# Patient Record
Sex: Female | Born: 1970 | Race: Black or African American | Hispanic: No | Marital: Married | Smoking: Never smoker
Health system: Southern US, Community
[De-identification: ages and names within clinical notes are randomized; demographics above are authoritative.]

## PROBLEM LIST (undated history)

## (undated) DIAGNOSIS — C801 Malignant (primary) neoplasm, unspecified: Secondary | ICD-10-CM

## (undated) DIAGNOSIS — T82198A Other mechanical complication of other cardiac electronic device, initial encounter: Secondary | ICD-10-CM

## (undated) DIAGNOSIS — E039 Hypothyroidism, unspecified: Secondary | ICD-10-CM

## (undated) DIAGNOSIS — D649 Anemia, unspecified: Secondary | ICD-10-CM

## (undated) DIAGNOSIS — E785 Hyperlipidemia, unspecified: Secondary | ICD-10-CM

## (undated) DIAGNOSIS — I38 Endocarditis, valve unspecified: Secondary | ICD-10-CM

## (undated) DIAGNOSIS — E538 Deficiency of other specified B group vitamins: Secondary | ICD-10-CM

## (undated) DIAGNOSIS — R0602 Shortness of breath: Secondary | ICD-10-CM

## (undated) DIAGNOSIS — G4730 Sleep apnea, unspecified: Secondary | ICD-10-CM

## (undated) DIAGNOSIS — Z95810 Presence of automatic (implantable) cardiac defibrillator: Secondary | ICD-10-CM

## (undated) DIAGNOSIS — I10 Essential (primary) hypertension: Secondary | ICD-10-CM

## (undated) DIAGNOSIS — I959 Hypotension, unspecified: Secondary | ICD-10-CM

## (undated) DIAGNOSIS — R0600 Dyspnea, unspecified: Secondary | ICD-10-CM

## (undated) DIAGNOSIS — I428 Other cardiomyopathies: Secondary | ICD-10-CM

## (undated) DIAGNOSIS — R000 Tachycardia, unspecified: Secondary | ICD-10-CM

## (undated) DIAGNOSIS — N189 Chronic kidney disease, unspecified: Secondary | ICD-10-CM

## (undated) DIAGNOSIS — Z8711 Personal history of peptic ulcer disease: Secondary | ICD-10-CM

## (undated) DIAGNOSIS — E559 Vitamin D deficiency, unspecified: Secondary | ICD-10-CM

## (undated) DIAGNOSIS — I639 Cerebral infarction, unspecified: Secondary | ICD-10-CM

## (undated) DIAGNOSIS — Z8639 Personal history of other endocrine, nutritional and metabolic disease: Secondary | ICD-10-CM

## (undated) DIAGNOSIS — I252 Old myocardial infarction: Secondary | ICD-10-CM

## (undated) DIAGNOSIS — I509 Heart failure, unspecified: Secondary | ICD-10-CM

## (undated) DIAGNOSIS — Z8719 Personal history of other diseases of the digestive system: Secondary | ICD-10-CM

## (undated) DIAGNOSIS — R0683 Snoring: Secondary | ICD-10-CM

## (undated) DIAGNOSIS — R002 Palpitations: Secondary | ICD-10-CM

## (undated) DIAGNOSIS — I219 Acute myocardial infarction, unspecified: Secondary | ICD-10-CM

## (undated) DIAGNOSIS — E0590 Thyrotoxicosis, unspecified without thyrotoxic crisis or storm: Secondary | ICD-10-CM

## (undated) HISTORY — DX: Old myocardial infarction: I25.2

## (undated) HISTORY — DX: Cerebral infarction, unspecified: I63.9

## (undated) HISTORY — PX: CARDIAC CATHETERIZATION: SHX172

## (undated) HISTORY — DX: Dyspnea, unspecified: R06.00

## (undated) HISTORY — DX: Malignant (primary) neoplasm, unspecified: C80.1

## (undated) HISTORY — DX: Snoring: R06.83

## (undated) HISTORY — DX: Chronic kidney disease, unspecified: N18.9

## (undated) HISTORY — DX: Other cardiomyopathies: I42.8

## (undated) HISTORY — DX: Essential (primary) hypertension: I10

## (undated) HISTORY — DX: Shortness of breath: R06.02

## (undated) HISTORY — DX: Hypotension, unspecified: I95.9

## (undated) HISTORY — DX: Morbid (severe) obesity due to excess calories: E66.01

## (undated) HISTORY — DX: Tachycardia, unspecified: R00.0

## (undated) HISTORY — DX: Heart failure, unspecified: I50.9

## (undated) HISTORY — DX: Endocarditis, valve unspecified: I38

## (undated) HISTORY — DX: Personal history of other endocrine, nutritional and metabolic disease: Z86.39

## (undated) HISTORY — DX: Deficiency of other specified B group vitamins: E53.8

## (undated) HISTORY — DX: Thyrotoxicosis, unspecified without thyrotoxic crisis or storm: E05.90

## (undated) HISTORY — PX: CARDIAC DEFIBRILLATOR PLACEMENT: SHX171

## (undated) HISTORY — DX: Palpitations: R00.2

## (undated) HISTORY — DX: Vitamin D deficiency, unspecified: E55.9

## (undated) HISTORY — DX: Anemia, unspecified: D64.9

## (undated) HISTORY — DX: Hyperlipidemia, unspecified: E78.5

## (undated) HISTORY — PX: LEFT VENTRICULAR ASSIST DEVICE: SHX2679

## (undated) NOTE — *Deleted (*Deleted)
Physical Medicine and Rehabilitation Admission H&P    Chief Complaint  Patient presents with  . Code Stroke  : HPI: [name]. Fedrick is a 24 year old right-handed female history of diastolic congestive heart failure with ejection fraction of 15 to 20% maintained on Entresto, nonischemic cardiomyopathy/VT/PVCs maintained on amiodarone and with cardiac defibrillator placement 2006, hypertension, hypothyroidism, morbid obesity with BMI 44.01.  Per chart review lives with . Husband works during the day.  Two-level home 3 steps to entry.  Independent prior to admission.  She did require some rest breaks during her ADLs.  She does not drive.  Presented 04/17/2020 with right side weakness and difficulty speaking.  Admission chemistries glucose 106 creatinine 1.28, urine drug screen negative, hemoglobin A1c 5.5.  Cranial CT scan showed subtle hypodensity involving the left putamen.  No acute hemorrhage.  Patient did not receive TPA.  CT angiogram of head and neck occlusion proximal left M1 segment with good distal collateral flow to the left MCA branches.  No other intracranial stenosis.  Patient underwent endovascular revascularization procedure per interventional radiology.  She did remain intubated for a short time.  Latest follow-up cranial CT scan redemonstrated abnormal hypodensity within the posterior aspect of the left putamen.  Follow-up echo cardiogram with ejection fraction of 20 to 25% grade 3 diastolic dysfunction and close follow-up per cardiology services with diuresis as directed as well as adjustments in amiodarone for frequent PVCs.  Currently maintained on low-dose aspirin as well as Brilinta.  Subcutaneous Lovenox for DVT prophylaxis.  Tolerating a regular diet.  Therapy evaluations completed and patient was admitted for a comprehensive rehab program.  Review of Systems  Constitutional: Negative for chills and fever.  HENT: Negative for hearing loss.   Eyes: Negative for blurred vision and  double vision.  Respiratory: Positive for shortness of breath. Negative for cough.   Cardiovascular: Positive for palpitations and leg swelling. Negative for chest pain.  Gastrointestinal: Positive for constipation. Negative for heartburn, nausea and vomiting.  Genitourinary: Negative for dysuria, flank pain and hematuria.  Musculoskeletal: Positive for joint pain and myalgias.  Skin: Negative for rash.  Psychiatric/Behavioral: The patient has insomnia.   All other systems reviewed and are negative.  Past Medical History:  Diagnosis Date  . Automatic implantable cardioverter-defibrillator in situ   . Chronic CHF (congestive heart failure) (HCC)    a. EF 15-20% b. RHC (09/2013) RA 14, RV 57/22, PA 64/36 (48), PCWP 18, FIck CO/CI 3.7/1.6, PVR 8.1 WU, PA sat 47%   . History of stomach ulcers   . Hypertension   . Hypotension   . Hypothyroidism   . Morbid obesity (HCC)   . Myocardial infarction (HCC) 08/2013  . Nocturnal dyspnea   . Nonischemic cardiomyopathy (HCC)   . Sinus tachycardia   . Snoring-prob OSA 09/04/2011  . Sprint Fidel[employer] ICD lead RECALL  [id number]   . UARS (upper airway resistance syndrome) 09/04/2011   HST 12/2013:  AHI 4/hr (numerous episodes of airflow reduction that did not have concomitant desaturation)    Past Surgical History:  Procedure Laterality Date  . BREATH TEK H PYLORI N/A 11/09/2014   Procedure: BREATH TEK H PYLORI;  Surgeon: Gaynelle Adu, MD;  Location: [facility];  Service: General;  Laterality: N/A;  . CARDIAC CATHETERIZATION  ~ 2006; 09/2013  . CARDIAC CATHETERIZATION N/A 05/23/2016   Procedure: Right Heart Cath;  Surgeon: Laurey Morale, MD;  Location: [facility] INVASIVE CV LAB;  Service: Cardiovascular;  Laterality: N/A;  . CARDIAC DEFIBRILLATOR  PLACEMENT  2006; 12/26/2013   Med[employer]onic [id number]; 12-2013 ICD gen change and RV lead revision with new 6935 RV lead by Dr Graciela Husbands  . CESAREAN SECTION  1999  . IMPLANTABLE CARDIOVERTER DEFIBRILLATOR GENERATOR CHANGE N/A  12/26/2013   Procedure: IMPLANTABLE CARDIOVERTER DEFIBRILLATOR GENERATOR CHANGE;  Surgeon: Duke Salvia, MD;  Location: [facility] CATH LAB;  Service: Cardiovascular;  Laterality: N/A;  . IR CT HEAD LTD  04/17/2020  . IR CT HEAD LTD  04/17/2020  . IR INTRA CRAN STENT  04/17/2020  . IR PERCUTANEOUS ART THROMBECTOMY/INFUSION INTRACRANIAL INC DIAG ANGIO  04/17/2020  . LEAD REVISION N/A 12/26/2013   Procedure: LEAD REVISION;  Surgeon: Duke Salvia, MD;  Location: [facility] CATH LAB;  Service: Cardiovascular;  Laterality: N/A;  . RADIOLOGY WITH ANESTHESIA N/A 04/17/2020   Procedure: Code Stroke;  Surgeon: Julieanne Cotton, MD;  Location: [facility] OR;  Service: Radiology;  Laterality: N/A;  . RIGHT HEART CATHETERIZATION N/A 02/15/2014   Procedure: RIGHT HEART CATH;  Surgeon: Laurey Morale, MD;  Location: [facility] CATH LAB;  Service: Cardiovascular;  Laterality: N/A;  . TUBAL LIGATION  1999   Family History  Problem Relation Age of Onset  . Heart disease Maternal Grandmother   . Heart disease Mother   . Obesity Mother   . High blood pressure Father   . Stroke Father    Social History:  reports that she has never smoked. She has never used smokeless tobacco. She reports that she does not drink alcohol and does not use drugs. Allergies: No Known Allergies Medications Prior to Admission  Medication Sig Dispense Refill  . amiodarone (PACERONE) 200 MG tablet Take 1 tablet (200 mg total) by mouth daily. 90 tablet 3  . atorvastatin (LIPITOR) 40 MG tablet TAKE 1 TABLET BY MOUTH ONCE A DAY (Patient taking differently: Take 40 mg by mouth daily. ) 30 tablet 11  . carvedilol (COREG) 25 MG tablet TAKE 1 TABLET BY MOUTH TWICE A DAY WITH A MEAL (Patient taking differently: Take 25 mg by mouth 2 (two) times daily with a meal. ) 180 tablet 3  . CORLANOR 5 MG TABS tablet TAKE 1 TABLET BY MOUTH TWICE (2) DAILY WITH MEALS (Patient taking differently: Take 5 mg by mouth 2 (two) times daily with a meal. ) 60 tablet 6  . dapagliflozin  propanediol (FARXIGA) 10 MG TABS tablet Take 10 mg by mouth daily before breakfast. 30 tablet 5  . digoxin (LANOXIN) 0.125 MG tablet TAKE 1 TABLET BY MOUTH ONCE A DAY (Patient taking differently: Take 0.125 mg by mouth daily. ) 30 tablet 11  . ENTRESTO 49-51 MG TAKE 1 TABLET BY MOUTH TWICE (2) DAILY (Patient taking differently: Take 1 tablet by mouth 2 (two) times daily. ) 60 tablet 11  . spironolactone (ALDACTONE) 25 MG tablet TAKE 1 TABLET BY MOUTH EVERY NIGHT AT BEDTIME (Patient taking differently: Take 25 mg by mouth at bedtime. ) 90 tablet 3  . torsemide (DEMADEX) 20 MG tablet Take 4 tablets (80 mg total) by mouth 2 (two) times daily for 3 days, THEN 4 tablets (80 mg total) in the morning for 30 days, THEN 3 tablets (60 mg total) every evening. (Patient taking differently: Take 4 tablets (80mg ) in the morning and 2 tablets (40mg ) in the evening.) 540 tablet 3  . levothyroxine (SYNTHROID) 25 MCG tablet Take 1 tablet (25 mcg total) by mouth daily before breakfast. 90 tablet 3  . metolazone (ZAROXOLYN) 2.5 MG tablet Take 1 tablet (2.5 mg total) by  mouth as needed (extra fluid as directed by HF clinic). 5 tablet 3  . potassium chloride SA (KLOR-CON) 20 MEQ tablet Take 2 tablets (40 mEq total) by mouth 2 (two) times daily. (Patient taking differently: Take 40 mEq by mouth 2 (two) times daily. Taking 2 tab in the morning and 2 tab at night) 60 tablet 6  . Vitamin D, Ergocalciferol, (DRISDOL) 1.25 MG (50000 UNIT) CAPS capsule Take 1 capsule (50,000 Units total) by mouth every 7 (seven) days. 4 capsule 0    Drug Regimen Review Drug regimen was reviewed and remains appropriate with no significant issues identified  Home: Home Living Family/patient expects to be discharged to:: Private residence Living Arrangements: Spouse/significant other, Children (son) Available Help at Discharge: Family (husband works from home) Type of Home: House Home Access: Stairs to enter Secretary/administrator of Steps: 3  Entrance Stairs-Rails: None Home Layout: Two level Alternate Level Stairs-Number of Steps: flight Alternate Level Stairs-Rails: Right, Left Bathroom Shower/Tub: Engineer, manufacturing systems: Standard Home Equipment: None  Lives With: Family   Functional History: Prior Function Level of Independence: Independent Comments: required rest breaks for ADL tasks, spouse assisted with socks, performed simple iADL tasks, doesn't drive  Functional Status:  Mobility: Bed Mobility Overal bed mobility: Modified Independent Bed Mobility: Rolling, Sidelying to Sit Rolling: Mod assist Sidelying to sit: Mod assist General bed mobility comments: VCs for sequencing/carrying out tasks, assist for RLE over EOB and trunk elevation; given increased time/cues pt is able to scoot herself towards EOB  Transfers Overall transfer level: Needs assistance Equipment used: 1 person hand held assist, 2 person hand held assist Transfers: Sit to/from Stand Sit to Stand: Supervision General transfer comment: performed x2 from EOB; light stabilizing assist once upright, cues for safe hand placement  Ambulation/Gait Ambulation/Gait assistance: Supervision Gait Distance (Feet): 200 Feet Assistive device: None Gait Pattern/deviations: Step-through pattern General Gait Details: steady with mild drift/list R with higher level balance challenges.  Pt able to scan, change/incr speeds, back up without incident.  Still not quite a baseline/age appropriate levels Gait velocity interpretation: >2.62 ft/sec, indicative of community ambulatory    ADL: ADL Overall ADL's : Needs assistance/impaired Eating/Feeding: NPO Grooming: Minimal assistance, Sitting Upper Body Bathing: Minimal assistance, Sitting Lower Body Bathing: Moderate assistance, Sit to/from stand, +2 for safety/equipment Upper Body Dressing : Minimal assistance, Sitting Lower Body Dressing: Moderate assistance, +2 for safety/equipment, Sit to/from stand  Lower Body Dressing Details (indicate cue type and reason): assist to don socks  Toilet Transfer: Minimal assistance, +2 for safety/equipment, +2 for physical assistance Toilet Transfer Details (indicate cue type and reason): simulated via few steps to recliner; +2 minA progressed to minA+1 (+2 safety) Toileting- Clothing Manipulation and Hygiene: Moderate assistance, Sit to/from stand Functional mobility during ADLs: Minimal assistance, +2 for physical assistance, +2 for safety/equipment (HHA)  Cognition: Cognition Overall Cognitive Status: Within Functional Limits for tasks assessed Arousal/Alertness: Awake/alert Orientation Level: Oriented X4 Attention: Focused, Sustained Focused Attention: Appears intact Sustained Attention: Appears intact Memory: Appears intact Awareness: Appears intact Problem Solving: Appears intact Safety/Judgment: Appears intact Cognition Arousal/Alertness: Awake/alert Behavior During Therapy: WFL for tasks assessed/performed Overall Cognitive Status: Within Functional Limits for tasks assessed Area of Impairment: Problem solving, Following commands, Awareness, Memory Memory: Decreased short-term memory Following Commands: Follows one step commands with increased time, Follows one step commands inconsistently Awareness: Emergent Problem Solving: Slow processing, Decreased initiation, Requires verbal cues, Requires tactile cues General Comments: pt A&Ox4, word finding difficulties, increased processing time and increased time for  verbalizations (approx 5-10 second delay); often requires multimodal cueing for command follow   Physical Exam: Blood pressure 103/62, pulse 78, temperature (!) 97.4 F (36.3 C), temperature source Axillary, resp. rate 17, height 5' 3.5" (1.613 m), weight 114.5 kg, SpO2 100 %. Physical Exam Neurological:     Comments: Patient is alert.  Mood is flat.  She is dysarthric with some aphasia word finding difficulty and hesitancy.  She  does provide her name and age.  Follows simple commands.     Results for orders placed or performed during the hospital encounter of 04/17/20 (from the past 48 hour(s))  Hemoglobin A1c     Status: None   Collection Time: 04/18/20  9:50 AM  Result Value Ref Range   Hgb A1c MFr Bld 5.5 4.8 - 5.6 %    Comment: (NOTE)         Prediabetes: 5.7 - 6.4         Diabetes: >6.4         Glycemic control for adults with diabetes: <7.0    Mean Plasma Glucose 111 mg/dL    Comment: (NOTE) Performed At: [facility] [address] [place], [place] [id number] Jolene Schimke MD JY:[id number]   Lipid panel     Status: Abnormal   Collection Time: 04/18/20  9:50 AM  Result Value Ref Range   Cholesterol 198 0 - 200 mg/dL   Triglycerides 92 <865 mg/dL   HDL 50 >78 mg/dL   Total CHOL/HDL Ratio 4.0 RATIO   VLDL 18 0 - 40 mg/dL   LDL Cholesterol 469 (H) 0 - 99 mg/dL    Comment:        Total Cholesterol/HDL:CHD Risk Coronary Heart Disease Risk Table                     Men   Women  1/2 Average Risk   3.4   3.3  Average Risk       5.0   4.4  2 X Average Risk   9.6   7.1  3 X Average Risk  23.4   11.0        Use the calculated Patient Ratio above and the CHD Risk Table to determine the patient's CHD Risk.        ATP III CLASSIFICATION (LDL):  <100     mg/dL   Optimal  629-528  mg/dL   Near or Above                    Optimal  130-159  mg/dL   Borderline  413-244  mg/dL   High  >010     mg/dL   Very High Performed at [facility], [address], [place], [place] [address]   Magnesium     Status: Abnormal   Collection Time: 04/18/20  9:50 AM  Result Value Ref Range   Magnesium 1.6 (L) 1.7 - 2.4 mg/dL    Comment: Performed at [facility], [address], [place], [place] [address]  Renal function panel     Status: Abnormal   Collection Time: 04/18/20  9:50 AM  Result Value Ref Range   Sodium 142 135 - 145 mmol/L   Potassium 3.5 3.5 - 5.1 mmol/L   Chloride 115 (H) 98  - 111 mmol/L   CO2 19 (L) 22 - 32 mmol/L   Glucose, Bld 91 70 - 99 mg/dL    Comment: Glucose reference range applies only to  samples taken after fasting for at least 8 hours.   BUN 9 6 - 20 mg/dL   Creatinine, Ser 1.61 0.44 - 1.00 mg/dL   Calcium 8.2 (L) 8.9 - 10.3 mg/dL   Phosphorus 2.8 2.5 - 4.6 mg/dL   Albumin 2.8 (L) 3.5 - 5.0 g/dL   GFR, Estimated >09 >60 mL/min    Comment: (NOTE) Calculated using the CKD-EPI Creatinine Equation (2021)    Anion gap 8 5 - 15    Comment: Performed at [facility], [address], [place], [place] [address]  CBC with Differential/Platelet     Status: Abnormal   Collection Time: 04/18/20  9:50 AM  Result Value Ref Range   WBC 16.0 (H) 4.0 - 10.5 K/uL   RBC 4.05 3.87 - 5.11 MIL/uL   Hemoglobin 9.6 (L) 12.0 - 15.0 g/dL   HCT 81.1 (L) 36 - 46 %   MCV 80.2 80.0 - 100.0 fL   MCH 23.7 (L) 26.0 - 34.0 pg   MCHC 29.5 (L) 30.0 - 36.0 g/dL   RDW 91.4 (H) 78.2 - 95.6 %   Platelets 294 150 - 400 K/uL   nRBC 0.0 0.0 - 0.2 %   Neutrophils Relative % 81 %   Neutro Abs 13.0 (H) 1.7 - 7.7 K/uL   Lymphocytes Relative 10 %   Lymphs Abs 1.6 0.7 - 4.0 K/uL   Monocytes Relative 8 %   Monocytes Absolute 1.3 (H) 0.1 - 1.0 K/uL   Eosinophils Relative 0 %   Eosinophils Absolute 0.0 0.0 - 0.5 K/uL   Basophils Relative 0 %   Basophils Absolute 0.0 0.0 - 0.1 K/uL   Immature Granulocytes 1 %   Abs Immature Granulocytes 0.09 (H) 0.00 - 0.07 K/uL    Comment: Performed at [facility], [address] [place], [place] [address]  Platelet inhibition p2y12 (Not at [facility])     Status: Abnormal   Collection Time: 04/18/20 12:37 PM  Result Value Ref Range   Platelet Function  P2Y12 48 (L) 182 - 335 PRU    Comment: (NOTE) The literature has shown a direct correlation of PRU values over 230 with higher risks of thrombotic events. Lower PRU values are associated with platelet inhibition. Performed at [facility], [address], [place], [place] [address]    Urine rapid drug screen (hosp performed)     Status: None   Collection Time: 04/18/20  4:11 PM  Result Value Ref Range   Opiates NONE DETECTED NONE DETECTED   Cocaine NONE DETECTED NONE DETECTED   Benzodiazepines NONE DETECTED NONE DETECTED   Amphetamines NONE DETECTED NONE DETECTED   Tetrahydrocannabinol NONE DETECTED NONE DETECTED   Barbiturates NONE DETECTED NONE DETECTED    Comment: (NOTE) DRUG SCREEN FOR MEDICAL PURPOSES ONLY.  IF CONFIRMATION IS NEEDED FOR ANY PURPOSE, NOTIFY LAB WITHIN 5 DAYS.  LOWEST DETECTABLE LIMITS FOR URINE DRUG SCREEN Drug Class                     Cutoff (ng/mL) Amphetamine and metabolites    1000 Barbiturate and metabolites    200 Benzodiazepine                 200 Tricyclics and metabolites     300 Opiates and metabolites        300 Cocaine and metabolites        300 THC  50 Performed at [facility], [address] [place], [place] [address]   Basic metabolic panel     Status: Abnormal   Collection Time: 04/20/20  3:03 AM  Result Value Ref Range   Sodium 143 135 - 145 mmol/L   Potassium 2.8 (L) 3.5 - 5.1 mmol/L   Chloride 105 98 - 111 mmol/L   CO2 26 22 - 32 mmol/L   Glucose, Bld 93 70 - 99 mg/dL    Comment: Glucose reference range applies only to samples taken after fasting for at least 8 hours.   BUN 9 6 - 20 mg/dL   Creatinine, Ser 6.04 (H) 0.44 - 1.00 mg/dL   Calcium 9.0 8.9 - 54.0 mg/dL   GFR, Estimated 55 (L) >60 mL/min    Comment: (NOTE) Calculated using the CKD-EPI Creatinine Equation (2021)    Anion gap 12 5 - 15    Comment: Performed at [facility], [address], [place], [place] [address]  Magnesium     Status: None   Collection Time: 04/20/20  3:03 AM  Result Value Ref Range   Magnesium 1.9 1.7 - 2.4 mg/dL    Comment: Performed at [facility], [address] [place], [place] [address]   CT ANGIO HEAD W OR WO CONTRAST  Result Date: 04/19/2020 CLINICAL DATA:   Mechanical thrombectomy with stent placement 2 days ago. Follow-up EXAM: CT ANGIOGRAPHY HEAD AND NECK TECHNIQUE: Multidetector CT imaging of the head and neck was performed using the standard protocol during bolus administration of intravenous contrast. Multiplanar CT image reconstructions and MIPs were obtained to evaluate the vascular anatomy. Carotid stenosis measurements (when applicable) are obtained utilizing NASCET criteria, using the distal internal carotid diameter as the denominator. CONTRAST:  OMNIPAQUE IOHEXOL 350 MG/ML SOLN COMPARISON:  Head CT from yesterday.  CTA head neck from 2 days ago FINDINGS: CT HEAD FINDINGS Brain: Better seen the likely non progressed left stratum infarct. No visible cortical infarct or hemorrhagic complication. No hydrocephalus Vascular: See below Skull: Negative Sinuses: Retention cyst appearance in the left maxillary sinus. Orbits: No acute finding Review of the MIP images confirms the above findings CTA NECK FINDINGS Aortic arch: No acute finding.  Two vessel branching. Right carotid system: Vessels are smooth and widely patent Left carotid system: Vessels are smooth and widely patent. Vertebral arteries: No proximal subclavian stenosis. Symmetrically sized vertebral arteries that are smooth and widely patent to the dura. Skeleton: Negative Other neck: No acute finding.  Unilateral right thyroid gland. Upper chest: Enlarged heart, partially covered. Review of the MIP images confirms the above findings CTA HEAD FINDINGS Anterior circulation: Partially obscured siphons due to venous enhancement. No stenosis or irregularity is seen. Left MCA stenting, spanning M1 to M2 segments. At the mid stent there is nonocclusive luminal soft tissue density from the posterior wall. No downstream diminished flow. No contralateral embolism is seen. There is early branching of the right MCA. Posterior circulation: The vertebral and basilar arteries are diffusely patent. No branch  occlusion, beading, or aneurysm. Venous sinuses: Diffusely patent Anatomic variants: None significant Review of the MIP images confirms the above findings IMPRESSION: 1. Left MCA stenting with nonocclusive thrombus seen along the posterior wall of the mid stent. 2. No stenosis or embolic source seen in the neck. Electronically Signed   By: Marnee Spring M.D.   On: 04/19/2020 07:38   CT HEAD WO CONTRAST  Result Date: 04/18/2020 CLINICAL DATA:  Stroke, follow-up. Intracranial stent  placed yesterday. EXAM: CT HEAD WITHOUT CONTRAST TECHNIQUE: Contiguous axial images were obtained from the base of the skull through the vertex without intravenous contrast. COMPARISON:  Noncontrast head CT performed 04/17/2020 at 6:18 p.m. Noncontrast head CT, CT angiogram head/neck and CT perfusion performed earlier the same day 04/17/2020. FINDINGS: Brain: Previously demonstrated vague hyperdensity within the left basal ganglia, left insula and left frontoparietal cortex is no longer present. Abnormal hypodensity is again demonstrated within the posterior aspect of the left putamen suspicious for acute/early subacute infarction (series 3, image 16). No other definite loss of gray-white differentiation is identified. No evidence of acute intracranial hemorrhage. No extra-axial fluid collection. No evidence of intracranial mass. No midline shift. Vascular: Redemonstrated vascular stent in the region of the M1/M2 left middle cerebral artery. No hyperdense vessel. Skull: Normal. Negative for fracture or focal lesion. Sinuses/Orbits: Visualized orbits show no acute finding. Moderate-sized left maxillary sinus mucous retention cyst. No significant mastoid effusion. IMPRESSION: Previously demonstrated vague hyperdensity within the left basal ganglia, left insula and left frontoparietal cortex is no longer present and likely reflected contrast staining. Redemonstrated abnormal hypodensity within the posterior aspect of the left putamen  suspicious for acute/early subacute infarction. No other definite loss of gray-white differentiation is identified. No acute intracranial hemorrhage. Redemonstrated vascular stent in the region of the M1/M2 left MCA. Electronically Signed   By: Jackey Loge DO   On: 04/18/2020 10:02   CT ANGIO NECK W OR WO CONTRAST  Result Date: 04/19/2020 CLINICAL DATA:  Mechanical thrombectomy with stent placement 2 days ago. Follow-up EXAM: CT ANGIOGRAPHY HEAD AND NECK TECHNIQUE: Multidetector CT imaging of the head and neck was performed using the standard protocol during bolus administration of intravenous contrast. Multiplanar CT image reconstructions and MIPs were obtained to evaluate the vascular anatomy. Carotid stenosis measurements (when applicable) are obtained utilizing NASCET criteria, using the distal internal carotid diameter as the denominator. CONTRAST:  OMNIPAQUE IOHEXOL 350 MG/ML SOLN COMPARISON:  Head CT from yesterday.  CTA head neck from 2 days ago FINDINGS: CT HEAD FINDINGS Brain: Better seen the likely non progressed left stratum infarct. No visible cortical infarct or hemorrhagic complication. No hydrocephalus Vascular: See below Skull: Negative Sinuses: Retention cyst appearance in the left maxillary sinus. Orbits: No acute finding Review of the MIP images confirms the above findings CTA NECK FINDINGS Aortic arch: No acute finding.  Two vessel branching. Right carotid system: Vessels are smooth and widely patent Left carotid system: Vessels are smooth and widely patent. Vertebral arteries: No proximal subclavian stenosis. Symmetrically sized vertebral arteries that are smooth and widely patent to the dura. Skeleton: Negative Other neck: No acute finding.  Unilateral right thyroid gland. Upper chest: Enlarged heart, partially covered. Review of the MIP images confirms the above findings CTA HEAD FINDINGS Anterior circulation: Partially obscured siphons due to venous enhancement. No stenosis or  irregularity is seen. Left MCA stenting, spanning M1 to M2 segments. At the mid stent there is nonocclusive luminal soft tissue density from the posterior wall. No downstream diminished flow. No contralateral embolism is seen. There is early branching of the right MCA. Posterior circulation: The vertebral and basilar arteries are diffusely patent. No branch occlusion, beading, or aneurysm. Venous sinuses: Diffusely patent Anatomic variants: None significant Review of the MIP images confirms the above findings IMPRESSION: 1. Left MCA stenting with nonocclusive thrombus seen along the posterior wall of the mid stent. 2. No stenosis or embolic source seen in the neck. Electronically Signed   By: Marja Kays  Watts M.D.   On: 04/19/2020 07:38   DG CHEST PORT 1 VIEW  Result Date: 04/19/2020 CLINICAL DATA:  Stroke. EXAM: PORTABLE CHEST 1 VIEW COMPARISON:  April 18, 2020. FINDINGS: Moderate cardiomegaly is again noted. Left-sided pacemaker is unchanged in position. Endotracheal and nasogastric tubes have been removed. No pneumothorax or pleural effusion is noted. Both lungs are clear. The visualized skeletal structures are unremarkable. IMPRESSION: Stable cardiomegaly. No acute cardiopulmonary abnormality seen. Endotracheal and nasogastric tubes have been removed. Electronically Signed   By: Lupita Raider M.D.   On: 04/19/2020 09:28   DG CHEST PORT 1 VIEW  Result Date: 04/18/2020 CLINICAL DATA:  Respiratory failure, ventilatory support EXAM: PORTABLE CHEST 1 VIEW COMPARISON:  04/17/2020 FINDINGS: Endotracheal tube 4 cm above the carina. Left subclavian pacemaker/defibrillator noted. NG tube enters the stomach with the tip not visualized. Stable cardiomegaly with prominent central vascularity. Dense left lower lobe collapse/consolidation, slightly worse. Difficult to exclude developing left effusion. No significant pneumothorax. IMPRESSION: Increased left lower lobe retrocardiac collapse/consolidation. Stable  cardiomegaly and vascular congestion Electronically Signed   By: Judie Petit.  Shick M.D.   On: 04/18/2020 09:41       Medical Problem List and Plan: 1.  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  Antithrombotics: -DVT/anticoagulation: Lovenox  -antiplatelet therapy: Aspirin 81 mg daily, Brilinta 90 mg twice daily 3. Pain Management: Tylenol as needed 4. Mood: Not emotional support  -antipsychotic agents: N/A 5. Neuropsych: This patient is capable of making decisions on her own behalf. 6. Skin/Wound Care: Routine skin checks 7. Fluids/Electrolytes/Nutrition: Routine in and outs with follow-up chemistries 8.  Chronic systolic congestive heart failure/and ICM/Medtronic ICD.  Follow-up cardiology services.  Continue Aldactone 25 mg daily, Coreg 3.125 mg twice daily as well as torsemide adjusted per cardiology services. 9.  History of VT/PVCs.  Amiodarone 200 mg twice daily.  Lanoxin 0.125 mg daily 10.  Hypothyroidism.  Synthroid 11.  Morbid obesity.  BMI 44.01.  Dietary follow-up 12.  Hyperlipidemia.  Lipitor 13.  OSA.  CPAP ***  Charlton Amor, PA-C 04/20/2020

---

## 1997-06-23 HISTORY — PX: TUBAL LIGATION: SHX77

## 2000-10-05 ENCOUNTER — Encounter (HOSPITAL_COMMUNITY): Admission: RE | Admit: 2000-10-05 | Discharge: 2001-01-03 | Payer: Self-pay | Admitting: Cardiology

## 2005-03-13 ENCOUNTER — Inpatient Hospital Stay: Payer: Self-pay | Admitting: Internal Medicine

## 2005-03-13 ENCOUNTER — Other Ambulatory Visit: Payer: Self-pay

## 2005-03-20 ENCOUNTER — Ambulatory Visit: Payer: Self-pay | Admitting: Internal Medicine

## 2005-03-27 ENCOUNTER — Observation Stay (HOSPITAL_COMMUNITY): Admission: AD | Admit: 2005-03-27 | Discharge: 2005-03-28 | Payer: Self-pay | Admitting: Internal Medicine

## 2005-03-28 ENCOUNTER — Ambulatory Visit: Payer: Self-pay | Admitting: Internal Medicine

## 2005-04-14 ENCOUNTER — Ambulatory Visit: Payer: Self-pay

## 2005-07-01 ENCOUNTER — Ambulatory Visit: Payer: Self-pay | Admitting: Internal Medicine

## 2005-09-30 ENCOUNTER — Ambulatory Visit: Payer: Self-pay | Admitting: Internal Medicine

## 2006-01-12 ENCOUNTER — Ambulatory Visit: Payer: Self-pay

## 2006-04-14 ENCOUNTER — Ambulatory Visit: Payer: Self-pay | Admitting: Internal Medicine

## 2006-04-24 ENCOUNTER — Ambulatory Visit: Payer: Self-pay

## 2006-05-25 ENCOUNTER — Ambulatory Visit: Payer: Self-pay | Admitting: Internal Medicine

## 2006-07-14 ENCOUNTER — Ambulatory Visit: Payer: Self-pay | Admitting: Internal Medicine

## 2006-10-13 ENCOUNTER — Ambulatory Visit: Payer: Self-pay | Admitting: Internal Medicine

## 2006-12-22 ENCOUNTER — Ambulatory Visit: Payer: Self-pay | Admitting: Internal Medicine

## 2007-04-01 ENCOUNTER — Ambulatory Visit: Payer: Self-pay | Admitting: Internal Medicine

## 2007-06-29 ENCOUNTER — Ambulatory Visit: Payer: Self-pay | Admitting: Internal Medicine

## 2007-10-11 ENCOUNTER — Ambulatory Visit: Payer: Self-pay | Admitting: Internal Medicine

## 2008-02-29 ENCOUNTER — Ambulatory Visit: Payer: Self-pay | Admitting: Internal Medicine

## 2008-02-29 LAB — CONVERTED CEMR LAB
ALT: 12 units/L (ref 0–35)
AST: 18 units/L (ref 0–37)
Albumin: 3.7 g/dL (ref 3.5–5.2)
Alkaline Phosphatase: 49 units/L (ref 39–117)
BUN: 11 mg/dL (ref 6–23)
Bilirubin, Direct: 0.1 mg/dL (ref 0.0–0.3)
CO2: 26 meq/L (ref 19–32)
Calcium: 9.3 mg/dL (ref 8.4–10.5)
Chloride: 108 meq/L (ref 96–112)
Creatinine, Ser: 0.8 mg/dL (ref 0.4–1.2)
GFR calc Af Amer: 104 mL/min
GFR calc non Af Amer: 86 mL/min
Glucose, Bld: 90 mg/dL (ref 70–99)
Magnesium: 1.8 mg/dL (ref 1.5–2.5)
Potassium: 4.2 meq/L (ref 3.5–5.1)
Pro B Natriuretic peptide (BNP): 23 pg/mL (ref 0.0–100.0)
Sodium: 139 meq/L (ref 135–145)
TSH: 2.9 microintl units/mL (ref 0.35–5.50)
Total Bilirubin: 0.5 mg/dL (ref 0.3–1.2)
Total Protein: 7.6 g/dL (ref 6.0–8.3)

## 2008-05-11 ENCOUNTER — Ambulatory Visit: Payer: Self-pay

## 2008-05-16 ENCOUNTER — Ambulatory Visit: Payer: Self-pay

## 2008-05-30 ENCOUNTER — Ambulatory Visit: Payer: Self-pay | Admitting: Internal Medicine

## 2008-09-13 ENCOUNTER — Ambulatory Visit: Payer: Self-pay | Admitting: Internal Medicine

## 2008-09-19 ENCOUNTER — Encounter: Payer: Self-pay | Admitting: Internal Medicine

## 2008-12-12 ENCOUNTER — Ambulatory Visit: Payer: Self-pay | Admitting: Internal Medicine

## 2009-01-02 ENCOUNTER — Encounter: Payer: Self-pay | Admitting: Internal Medicine

## 2009-03-12 ENCOUNTER — Ambulatory Visit: Payer: Self-pay | Admitting: Internal Medicine

## 2009-03-12 DIAGNOSIS — E669 Obesity, unspecified: Secondary | ICD-10-CM | POA: Insufficient documentation

## 2009-03-12 DIAGNOSIS — I5022 Chronic systolic (congestive) heart failure: Secondary | ICD-10-CM | POA: Insufficient documentation

## 2009-03-12 DIAGNOSIS — I428 Other cardiomyopathies: Secondary | ICD-10-CM | POA: Insufficient documentation

## 2009-06-12 ENCOUNTER — Ambulatory Visit: Payer: Self-pay | Admitting: Internal Medicine

## 2009-06-25 ENCOUNTER — Encounter: Payer: Self-pay | Admitting: Internal Medicine

## 2009-09-11 ENCOUNTER — Ambulatory Visit: Payer: Self-pay | Admitting: Internal Medicine

## 2009-10-01 ENCOUNTER — Encounter: Payer: Self-pay | Admitting: Internal Medicine

## 2009-12-12 ENCOUNTER — Ambulatory Visit: Payer: Self-pay | Admitting: Internal Medicine

## 2010-01-03 ENCOUNTER — Encounter: Payer: Self-pay | Admitting: Internal Medicine

## 2010-03-07 ENCOUNTER — Encounter (INDEPENDENT_AMBULATORY_CARE_PROVIDER_SITE_OTHER): Payer: Self-pay | Admitting: *Deleted

## 2010-04-25 ENCOUNTER — Ambulatory Visit: Payer: Self-pay | Admitting: Internal Medicine

## 2010-05-13 ENCOUNTER — Telehealth (INDEPENDENT_AMBULATORY_CARE_PROVIDER_SITE_OTHER): Payer: Self-pay | Admitting: *Deleted

## 2010-05-15 ENCOUNTER — Encounter: Payer: Self-pay | Admitting: Internal Medicine

## 2010-07-01 ENCOUNTER — Encounter: Payer: Self-pay | Admitting: Cardiovascular Disease

## 2010-07-01 ENCOUNTER — Ambulatory Visit
Admission: RE | Admit: 2010-07-01 | Discharge: 2010-07-01 | Payer: Self-pay | Source: Home / Self Care | Attending: Internal Medicine | Admitting: Internal Medicine

## 2010-07-01 ENCOUNTER — Encounter: Payer: Self-pay | Admitting: Internal Medicine

## 2010-07-02 LAB — CONVERTED CEMR LAB
BUN: 13 mg/dL (ref 6–23)
CO2: 26 meq/L (ref 19–32)
Calcium: 9.7 mg/dL (ref 8.4–10.5)
Chloride: 102 meq/L (ref 96–112)
Creatinine, Ser: 0.83 mg/dL (ref 0.40–1.20)
Glucose, Bld: 87 mg/dL (ref 70–99)
Potassium: 3.6 meq/L (ref 3.5–5.3)
Sodium: 139 meq/L (ref 135–145)

## 2010-07-23 NOTE — Letter (Signed)
Summary: Remote Device Check  [facility], Main Office  [address]   [place], [place] [address]   Phone: [phone]  Fax: [phone]     October 01, 2009 MRN: [id number]   BRIAHNA PESCADOR [address] [place], [place]  [address]   Dear Ms. Kott,   Your remote transmission was recieved and reviewed by your physician.  All diagnostics were within normal limits for you.  __X___Your next transmission is scheduled for:  December 12, 2009.  Please transmit at any time this day.  If you have a wireless device your transmission will be sent automatically.     Sincerely,  Proofreader

## 2010-07-23 NOTE — Letter (Signed)
Summary: Remote Device Check  [facility], Main Office  [address]   [place], [place] [address]   Phone: [phone]  Fax: [phone]     January 03, 2010 MRN: [id number]   [facility] [address] [place], [place]  [address]   Dear Ms. Yoshida,   Your remote transmission was recieved and reviewed by your physician.  All diagnostics were within normal limits for you.   __X____Your next office visit is scheduled for:  September w/Dr Graciela Husbands. Please call our office to schedule an appointment.    Sincerely,  Vella Kohler

## 2010-07-23 NOTE — Letter (Signed)
Summary: Remote Device Check  [facility], Main Office  [address]   [place], [place] [address]   Phone: [phone]  Fax: [phone]     May 15, 2010 MRN: [id number]   [facility] [address] [place], [place]  [address]   Dear Ms. Degeorge,   Your remote transmission was recieved and reviewed by your physician.  All diagnostics were within normal limits for you.  __X____Your next office visit is scheduled for: 07-01-10 @ 1545 with Dr Graciela Husbands.     Sincerely,  Vella Kohler

## 2010-07-23 NOTE — Cardiovascular Report (Signed)
Summary: Office Visit Remote  Office Visit Remote   Imported By: Roderic Ovens 01/23/2009 16:01:26  _____________________________________________________________________  External Attachment:    Type:   Image     Comment:   External Document

## 2010-07-23 NOTE — Cardiovascular Report (Signed)
Summary: Office Visit Remote   Office Visit Remote   Imported By: Roderic Ovens 01/07/2010 12:23:12  _____________________________________________________________________  External Attachment:    Type:   Image     Comment:   External Document

## 2010-07-23 NOTE — Letter (Signed)
Summary: Remote Device Check  [facility], Main Office  [address]   [place], [place] [address]   Phone: [phone]  Fax: [phone]     January 02, 2009 MRN: [id number]   [facility] [address] [place], [place]  [address]   Dear Ms. Dosher,   Your remote transmission was recieved and reviewed by your physician.  All diagnostics were within normal limits for you.   ___X___Your next office visit is scheduled for:  September with Dr Graciela Husbands in our [place] office. Please call our office at [phone] to schedule an appointment.    Sincerely,  Proofreader

## 2010-07-23 NOTE — Cardiovascular Report (Signed)
Summary: Office Visit Remote   Office Visit Remote   Imported By: Roderic Ovens 05/27/2010 14:56:37  _____________________________________________________________________  External Attachment:    Type:   Image     Comment:   External Document

## 2010-07-23 NOTE — Progress Notes (Signed)
   DDS request received sent to [facility]  May 13, 2010 1:43 PM

## 2010-07-23 NOTE — Letter (Signed)
Summary: Remote Device Check  [facility], Main Office  [address]   [place], [place] [address]   Phone: [phone]  Fax: [phone]     June 25, 2009 MRN: [id number]   [facility] [address] [place], [place]  [address]   Dear Ms. Chokshi,   Your remote transmission was recieved and reviewed by your physician.  All diagnostics were within normal limits for you.  __X___Your next transmission is scheduled for:     September 11, 2009.  Please transmit at any time this day.  If you have a wireless device your transmission will be sent automatically.      Sincerely,  Proofreader

## 2010-07-23 NOTE — Cardiovascular Report (Signed)
Summary: Office Visit Remote   Office Visit Remote   Imported By: Roderic Ovens 06/28/2009 12:30:32  _____________________________________________________________________  External Attachment:    Type:   Image     Comment:   External Document

## 2010-07-23 NOTE — Letter (Signed)
Summary: Appointment - Reminder 2  [facility], Main Office  [address]   [place], [place] [address]   Phone: [phone]  Fax: [phone]     March 07, 2010 MRN: [id number]   [facility] [address] [place], [place]  [address]   Dear Ms. Friebel,  Our records indicate that it is time to schedule a follow-up appointment.  Dr. Graciela Husbands         recommended that you follow up with [name] in  September in [place]          . It is very important that we reach you to schedule this appointment. We look forward to participating in your health care needs. Please contact us at the number listed above at your earliest convenience to schedule your appointment.  If you are unable to make an appointment at this time, give [name] a call so we can update our records.     Sincerely,  Altha Harm, LPN  March 07, 2010 3:27 PM  [facility] Scheduling Team [phone]

## 2010-07-23 NOTE — Cardiovascular Report (Signed)
Summary: Office Visit Remote  Office Visit Remote   Imported By: Roderic Ovens 10/02/2009 12:40:27  _____________________________________________________________________  External Attachment:    Type:   Image     Comment:   External Document

## 2010-07-25 NOTE — Procedures (Signed)
Summary: ROV/alt  Medications Added LEVOTHYROXINE SODIUM 50 MCG TABS (LEVOTHYROXINE SODIUM) take one tablet daily SPIRONOLACTONE 25 MG TABS (SPIRONOLACTONE) Take one tablet by mouth daily      Allergies Added: NKDA  Visit Type:  Follow-up Referring Provider:  Lyn Hollingshead Paraschos,M.D. Primary Provider:  Terance Hart, MD  CC:  "Doing well".  History of Present Illness: It was a pleasure to see [name] today in followup for defibrillator implanted for nonischemic myopathy and did have improvement in her LVEF by your note as far as up to 35-40%.   She has no complaints at chest pain    She has fhortness of breath, nocturnal dyspnea and recumbent cough;  she has some edema.  She also has modest fatigue  She denies significant salt intake  She has been told she snores. She does not how ever have daytime fatigue or hypersomnolence  Preventive Screening-Counseling & Management  Alcohol-Tobacco     Smoking Status: never  Caffeine-Diet-Exercise     Does Patient Exercise: no      Drug Use:  no.    Current Medications (verified): 1)  Lisinopril 10 Mg Tabs (Lisinopril) .... Take One Tablet By Mouth Daily 2)  Carvedilol 25 Mg Tabs (Carvedilol) .... Take One Tablet By Mouth Twice A Day 3)  Furosemide 40 Mg Tabs (Furosemide) .... Take One Tablet By Mouth Daily. 4)  Daily Multi  Tabs (Multiple Vitamins-Minerals) .Marland Kitchen.. 1 By Mouth Once Daily 5)  Levothyroxine Sodium 50 Mcg Tabs (Levothyroxine Sodium) .... Take One Tablet Daily  Allergies (verified): No Known Drug Allergies  Past History:  Past Medical History: Last updated: 03/12/2009  1. Nonischemic cardiomyopathy.   2. Inappropriate therapy for sinus tachycardia.   3. Nocturnal dyspnea.   4. Class 3 Congestive Heart Failure  5. Hypertension  Past Surgical History: Last updated: 03/12/2009 defibrillator  Social History: Last updated: 07/01/2010 Disabled Defib implant Married  Tobacco Use - No.  Alcohol Use - no Regular  Exercise - yes--30 min. daily Drug Use - no  Risk Factors: Exercise: no (07/01/2010)  Risk Factors: Smoking Status: never (07/01/2010)  Social History: Disabled Defib implant Married  Tobacco Use - No.  Alcohol Use - no Regular Exercise - yes--30 min. daily Drug Use - no Smoking Status:  never Does Patient Exercise:  no Drug Use:  no  Vital Signs:  Patient profile:   40 year old female Height:      64 inches Weight:      237 pounds BMI:     40.83 Pulse rate:   87 / minute BP sitting:   134 / 89  (left arm) Cuff size:   large  Vitals Entered By: Bishop Dublin, CMA (July 01, 2010 3:46 PM)  Physical Exam  General:  The patient was alert and oriented in no acute distress.  obese HEENT Normal.  Neck veins were 8-9 cmt, carotids were brisk.  Lungs were clear.  Heart sounds were regular without murmurs or gallops.  Abdomen was soft with active bowel sounds. There is no clubbing cyanosis ; trr edema. Skin Warm and dry     ICD Specifications Following MD:  Sherryl Manges, MD     ICD Vendor:  Medtronic     ICD Model Number:  7232     ICD Serial Number:  [id number] ICD DOI:  03/27/2005     ICD Implanting MD:  Sherryl Manges, MD  Lead 1:    Location: RV     DOI: 03/27/2005     Model #:  [id number]     Serial #: [id number]     Status: active  Indications::  NICM   ICD Follow Up Remote Check?  No Battery Voltage:  3.03 V     Charge Time:  8.33 seconds     Underlying rhythm:  SR ICD Dependent:  No       ICD Device Measurements Right Ventricle:  Amplitude: 17.1 mV, Impedance: 592 ohms, Threshold: 1.0 V at 0.3 msec Shock Impedance: 44/53 ohms   Episodes Coumadin:  No Shock:  0     ATP:  0     Nonsustained:  0     Ventricular Pacing:  <0.1%  Brady Parameters Mode VVI     Lower Rate Limit:  40      Tachy Zones VF:  200     VT:  240     VT1:  182     Next Remote Date:  10/03/2010     Next Cardiology Appt Due:  06/24/2011 Tech Comments:  No parameter changes.  Device function  normal.  6949 lead stable, SIC 1.  Alert tones demonstrated.  Carelink transmissions every 3 months.  ROV 1 year with Dr. Graciela Husbands in [place]. Altha Harm, LPN  July 01, 2010 3:58 PM   Impression & Recommendations:  Problem # 1:  IMPLANTATION OF DEFIBRILLATOR, MDT [place] (ICD-V45.02) Device parameters and data were reviewed and no changes were made  Problem # 2:  6949 LEAD (ICD-996.04) lead tones reviewed and instructions reiterated  Problem # 3:  SYSTOLIC HEART FAILURE, CHRONIC (ICD-428.22)  she manifests volume overload and symptoms of claass2b/3 chf.  will increae her lasix to two times a day for one week and add aldactone for mortality benefit.  we will need to check Bmet in 2 weeks;  side effects reviewed The following medications were removed from the medication list:    Spironolactone 25 Mg Tabs (Spironolactone) .Marland Kitchen... Take one tablet by mouth daily Her updated medication list for this problem includes:    Lisinopril 10 Mg Tabs (Lisinopril) .Marland Kitchen... Take one tablet by mouth daily    Carvedilol 25 Mg Tabs (Carvedilol) .Marland Kitchen... Take one tablet by mouth twice a day    Furosemide 40 Mg Tabs (Furosemide) .Marland Kitchen... Take one tablet by mouth daily.    Spironolactone 25 Mg Tabs (Spironolactone) .Marland Kitchen... Take one tablet by mouth daily  Orders: T-Basic Metabolic Panel [phone])  Problem # 4:  CARDIOMYOPATHY, PRIMARY, DILATED (ICD-425.4)  as above The following medications were removed from the medication list:    Spironolactone 25 Mg Tabs (Spironolactone) .Marland Kitchen... Take one tablet by mouth daily Her updated medication list for this problem includes:    Lisinopril 10 Mg Tabs (Lisinopril) .Marland Kitchen... Take one tablet by mouth daily    Carvedilol 25 Mg Tabs (Carvedilol) .Marland Kitchen... Take one tablet by mouth twice a day    Furosemide 40 Mg Tabs (Furosemide) .Marland Kitchen... Take one tablet by mouth daily.    Spironolactone 25 Mg Tabs (Spironolactone) .Marland Kitchen... Take one tablet by mouth daily  Orders: T-Basic Metabolic Panel  ([id number])  Patient Instructions: 1)  Your physician recommends that you schedule a follow-up appointment in: 1 year 2)  Your physician has recommended you make the following change in your medication: START Spironolactone 25mg  once daily  Prescriptions: SPIRONOLACTONE 25 MG TABS (SPIRONOLACTONE) Take one tablet by mouth daily  #30 x 6   Entered by:   Lanny Hurst RN   Authorized by:   Nathen May, MD, [facility]   Signed by:  Lanny Hurst RN on 07/01/2010   Method used:   Electronically to        [facility]retail)       [address]       [place], [place]  [address]       Ph: [phone]       Fax: [phone]   RxID:   [id number]

## 2010-10-03 ENCOUNTER — Encounter: Payer: Self-pay | Admitting: *Deleted

## 2010-10-08 ENCOUNTER — Encounter: Payer: Self-pay | Admitting: *Deleted

## 2010-10-11 ENCOUNTER — Ambulatory Visit (INDEPENDENT_AMBULATORY_CARE_PROVIDER_SITE_OTHER): Payer: 59 | Admitting: *Deleted

## 2010-10-11 ENCOUNTER — Other Ambulatory Visit: Payer: Self-pay

## 2010-10-11 DIAGNOSIS — Z95810 Presence of automatic (implantable) cardiac defibrillator: Secondary | ICD-10-CM

## 2010-10-11 DIAGNOSIS — I428 Other cardiomyopathies: Secondary | ICD-10-CM

## 2010-10-15 NOTE — Progress Notes (Signed)
icd remote check  

## 2010-10-22 ENCOUNTER — Encounter: Payer: Self-pay | Admitting: *Deleted

## 2010-11-05 NOTE — Letter (Signed)
December 22, 2006    Dr. Trinna Post Paraschos  [address]  [place], [place] [address]   RE:  [name], [name]  MRN:  [id number]  /  DOB:  06/01/1971   Dear Trinna Post,   [name] comes in today.  She is status post ICD implantation for  primary prevention in the setting of ischemic cardiomyopathy.  She has  nocturnal dyspnea, progressive exercise intolerance over the last couple  of months, and has noted a 10-15 pound weight gain with lower extremity  swelling as well as abdominal swelling.  Her salt intake is miniscule.   CURRENT MEDICATIONS:  1. Lasix 40.  2. Digoxin 0.25.  3. Spironolactone 25.  4. Lisinopril 5.  5. Coreg 25 b.i.d.   PHYSICAL EXAMINATION:  VITAL SIGNS:  Her weight was 222, up from 202 on  January, 2007.  Her blood pressure was 112/82 with a pulse of 60.  LUNGS:  Clear.  NECK:  Neck veins were 7-8 cm.  CARDIAC:  Heart sounds were regular.  EXTREMITIES:  Edema 1-2+.   Interrogation of her Medtronic Maximo device demonstrates an R wave of  11.5 with an impedance of 615 with a threshold of 1 volt at 0.3.  High  voltage impedances were 45/53.  Battery voltage was 3.17.  There were no  intercurrent episodes.   IMPRESSION:  1. Nonischemic cardiomyopathy.  2. Status post implantable cardioverter/defibrillator for primary      prevention.  3. Progressive congestive failure manifested by dyspnea and edema.   I have taken the liberty, Alex, of asking her to increase her Lasix to  40, alternating with 80, until which time her weight gets down to about  the 212 mark.  She is supposed to see you in a couple of months, and we  will see her in one year's time for her device followup.    Sincerely,      Duke Salvia, MD, [facility]  Electronically Signed    SCK/MedQ  DD: 12/22/2006  DT: 12/22/2006  Job #: [id number]

## 2010-11-05 NOTE — Letter (Signed)
February 29, 2008    Marcina Millard, MD  [address]  [place], [place] [address]   RE:  Miranda, Clark  MRN:  [id number]  /  DOB:  June 08, 1971   Dear Trinna Post,   It was a pleasure to see [name] today in followup for defibrillator  implanted for nonischemic myopathy and did have improvement in her LVEF  by your note as far as up to 35-40%.   I should say by the way, we will come back in the hope of doing well.   She also describes paroxysmal nocturnal dyspnea where she has to sit up  for 10-15 minutes.  On one of these events, her heart rate actually got  faster enough that her defibrillator detected it as a VT episode without  therapy because of its gradual onset.   MEDICATIONS:  1. Lisinopril 5.  2. Spironolactone 25.  3. Lasix 40.  4. Coreg.   PHYSICAL EXAMINATION:  VITAL SIGNS:  Her weight notably is 252 pounds, a  year ago it was 222 pounds, and two and half years ago it was 202  pounds.  Her blood pressure is 130/80.  Her pulse is 86.  LUNGS:  Clear.  NECK:  Neck veins were 7-8 cm.  There was some hepatojugular reflux.  HEART:  Sounds were regular.  ABDOMEN:  Soft.  EXTREMITIES:  Had trace edema.   Interrogation of Medtronic Maximo ICD demonstrates an R-wave of 10.7,  impedance of 584, and threshold 1 volt at 0.3.  Battery voltage 3.13.  The device was reprogrammed to as to avoid inappropriate therapy.   IMPRESSION:  1. Nonischemic cardiomyopathy.  2. Inappropriate therapy for sinus tachycardia.  3. Nocturnal dyspnea.  4. Marked increase in weight.     Alex, I have taken the liberty today of checking the BNP as well as a  thyroid CBC and chemistries to see if we can understand:  a.  Whether she is having left-sided failure that might be contributing  to her nocturnal dyspnea.  b.  That as an easy explanation as to her weight gain.   I have also prescribed for her Imdur 30 mg to take at night to see if we  cannot, with some venodilatation,  prevent  her nocturnal dyspnea.   We will see her again in 1 year's time.     Sincerely,      Duke Salvia, MD, [facility]  Electronically Signed    SCK/MedQ  DD: 02/29/2008  DT: 03/01/2008  Job #: [id number]   CC:    Dorothey Baseman, M.D.

## 2010-11-08 NOTE — Op Note (Signed)
NAMEARMANDINA, [name]                  ACCOUNT NO.:  [id number]   MEDICAL RECORD NO.:  [id number]          PATIENT TYPE:  INP   LOCATION:  2899                         FACILITY:  [facility]   PHYSICIAN:  Duke Salvia, M.D.  DATE OF BIRTH:  07/31/1970   DATE OF PROCEDURE:  03/27/2005  DATE OF DISCHARGE:                                 OPERATIVE REPORT   PREOPERATIVE DIAGNOSIS:  Nonischemic cardiomyopathy and class III congestive  failure.  Narrow QRS.   POSTOPERATIVE DIAGNOSIS:  Nonischemic cardiomyopathy and class III  congestive failure.  Narrow QRS.   PROCEDURE:  Single-chamber defibrillator implantation with intraoperative  defibrillation threshold testing.   After obtaining informed consent, the patient was brought to the  electrophysiology laboratory and placed on the fluoroscopic table in the  supine position.  After routine prep and drape of the left upper chest,  lidocaine was infiltrated in the prepectoral/subclavicular region.  An  incision was made and carried down to the level of the prepectoral fascia,  using electrocautery and sharp dissection.  A pocket was formed similarly.  Hemostasis was obtained.   Thereafter, attention was turned, gaining access to the extrathoracic or  subclavian vein, which was accomplished without difficulty and without the  aspiration of air or puncture of the artery.  One venipuncture was  accomplished.  A guidewire was placed and retained.  This was then passed.  A 7 French tear-away introducer sheath, which was passed.  A Medtronic 6949  65 cm dual coil active fixation defibrillator lead, serial #[id number].  Under fluoroscopic guidance, the lead was manipulated in the right  ventricular apex with a bipolar R wave of 14.8 millivolts with a pacing  impedence of 950 under a threshold of 0.7 volts and 0.5 milliseconds.  The  current threshold was 0.8 mA, and there was no diaphragmatic pacing at 10  volts.  The __________ looked good.   With  these successful parameters reported, the lead was secured to the  prepectoral fascia and tested with a Medtronic Maximo 7232-CX ICD serial  #[id number].  Through the device of the bipolar R wave, 11 mV with a pacing  impedence of 672 ohm of threshold and 1 volt of 0.3 milliseconds, current  threshold.  High voltage impedence was 42 ohm.  The distal coil impedence  was 36 ohm.  With these parameters recorded, defibrillation threshold  testing was undertaken.  Ventricular fibrillation was induced via the T wave  shock.  After a total duration of 6.5 seconds, a 20 joule shock was  delivered through a measured resistance of 36 ohm.  Terminated ventricular  fibrillation, restoring sinus rhythm.   After a wait of 5-6 minutes, ventricular fibrillation was reintroduced again  via a T wave shock.  After a total duration of 6.5 seconds, a 20 joule shock  was delivered through a measured resistance of 37 ohm.  Terminated  ventricular fibrillation, restoring sinus rhythm.  On this episode, there  was one missed VF detection at a sensitivity of 1.2 mV.  The device was  programmed at nominal 0.3 mV.   With these  acceptable parameters recorded, the system was implanted.  The  pocket was copiously irrigated with antibiotic-containing saline solution.  Hemostasis was assured, and the leads and the pulse generator were placed in  the pocket, secured within the prepectoral fascia.  The wound was closed in  three layers in a normal fashion.  The  wound was washed and dried, and a Benzoin/Steri-Strip dressing was applied.  Needle counts, sponge counts, and instrument counts were correct at the end  of the procedure, according to the staff.  The patient tolerated the  procedure without apparent complications.           ______________________________  Duke Salvia, M.D.     SCK/MEDQ  D:  03/27/2005  T:  03/27/2005  Job:  [id number]   cc:   [facility]    Electrophysiology lab

## 2010-11-08 NOTE — Discharge Summary (Signed)
[name], [name]                  ACCOUNT NO.:  [id number]   MEDICAL RECORD NO.:  [id number]          PATIENT TYPE:  INP   LOCATION:  4707                         FACILITY:  [facility]   PHYSICIAN:  Duke Salvia, M.D.  DATE OF BIRTH:  10-Jun-1971   DATE OF ADMISSION:  03/27/2005  DATE OF DISCHARGE:  03/28/2005                                 DISCHARGE SUMMARY   ALLERGIES:  No known drug allergies.   DISCHARGE DIAGNOSES:  1.  Discharging day one status post implantation of Medtronic Maximo-VR-      7332CX cardioverter defibrillator.  Defibrillator threshold study less      than or equal to 20 joules.  2.  Non-ischemic cardiomyopathy with ejection fraction of 10%, demonstrated      eight months after the birth of second child.  3.  Class 3 congestive heart failure.   SECONDARY DIAGNOSIS:  1.  Hypertension.  2.  Recurrent tachycardic palpitations associated with pre-syncope.   PLAN:  1.  Discharging from [facility] on March 28, 2005.  2.  Follow up with Dr. Marcina Millard on Wednesday, April 09, 2005.      At that time consideration of up-titration of Coreg will be entertained.  3.  ICD Clinic at the [facility] in three months.  4.  If the patient continues to have shortness of breath and exercise      intolerance, then consideration will be given to tissue Doppler imaging      and possible synchronization therapy in addition to existent      cardioverter defibrillator therapy.   DISPOSITION/CONDITION ON DISCHARGE:  [name] is discharged on day one after  implantation of a cardioverter defibrillator.  She is afebrile.  She is  achieving 98% oxygen saturation on room air.  Her incision is looking good.  There is no hematoma.  There is no drainage of erythema at the incision.  Her chest x-ray shows that the lead is in appropriate position.  She has  also had post-procedure ICD interrogation with no changes made.  All values  within normal limits.   DISCHARGE INSTRUCTIONS:  1.  She has been instructed in keeping her left upper extremity quiet for      the next four to five days, and then slowly increasing mobility with      return to regular mobility by next Thursday, April 03, 2005.  2.  She is asked not to drive for the next seven days.  3.  To avoid getting her incision wet also for the next seven days.   DISCHARGE MEDICATIONS:  1.  Coreg 6.25 mg b.i.d.  2.  Digitek 0.25 mg daily.  3.  Spironolactone 25 mg daily.  4.  Lasix 40 mg daily.  5.  Lisinopril 5 mg daily.  6.  She has been given a prescription for Darvocet-N 100, one to two tab q.3-      4h. for incisional discomfort and to help her gain night-time rest.   FOLLOWUP:  1.  At the ICD Clinic at [facility], [address], on      Monday, April 14, 2005, at 9 a.m. for an incision check.   HOSPITAL COURSE:  Described mainly in the discharge disposition.  The  patient was admitted electively on March 27, 2005.  She underwent an  implantation of a cardioverter defibrillator by Dr. Duke Salvia on the  same day.  She did have a defibrillator threshold study less than or equal  to 20 joules.  The patient has had no post-procedure complications.  She is  discharged on March 28, 2005, day number one after implant.      Maple Mirza, P.A.    ______________________________  Duke Salvia, M.D.    GM/MEDQ  D:  03/28/2005  T:  03/28/2005  Job:  [id number]   cc:   Marcina Millard, M.D.  [facility]  [address]  [place], [place] [address]

## 2010-11-08 NOTE — Assessment & Plan Note (Signed)
[facility]                           ELECTROPHYSIOLOGY OFFICE NOTE   NAME:Clark, Miranda SHIMON                         MRN:          [id number]  DATE:01/12/2006                            DOB:          06/06/1971    [name] is seen in [facility] today, January 12, 2006, for follow up of  her Medtronic Maximo Model [phone] single chamber defibrillator implanted for  nonischemic cardiomyopathy in October 2006.  Upon interrogation, there are  no episodes stored in the device.  Battery voltage is 3.2 volts with last  recorded charge time of 7.52 seconds.  In the right ventricle, intrinsic  amplitude is 12.6 millivolts with an impedence of 536 ohms and threshold of  1 volt at 0.2 milliseconds.  High voltage lead impedence of 43 ohms.  Her  ventricular amplitude was decreased to 2.5 volts per protocol.  No other  changes were made.  She will continue with [facility] transmissions in 3, 6  and 9 months and be seen by Dr. Graciela Husbands in 12 months time.                                   Cleatrice Burke, RN                                Duke Salvia, MD, [facility]   CF/MedQ  DD:  01/12/2006  DT:  01/12/2006  Job #:  [id number]

## 2011-01-16 ENCOUNTER — Ambulatory Visit (INDEPENDENT_AMBULATORY_CARE_PROVIDER_SITE_OTHER): Payer: 59 | Admitting: *Deleted

## 2011-01-16 ENCOUNTER — Other Ambulatory Visit: Payer: Self-pay | Admitting: Internal Medicine

## 2011-01-16 DIAGNOSIS — I5022 Chronic systolic (congestive) heart failure: Secondary | ICD-10-CM

## 2011-01-16 DIAGNOSIS — Z95810 Presence of automatic (implantable) cardiac defibrillator: Secondary | ICD-10-CM

## 2011-01-16 DIAGNOSIS — I428 Other cardiomyopathies: Secondary | ICD-10-CM

## 2011-01-21 NOTE — Progress Notes (Signed)
icd remote check  

## 2011-01-24 ENCOUNTER — Encounter: Payer: Self-pay | Admitting: *Deleted

## 2011-01-28 ENCOUNTER — Telehealth: Payer: Self-pay | Admitting: *Deleted

## 2011-01-28 MED ORDER — SPIRONOLACTONE 25 MG PO TABS: 25.0000 mg | ORAL_TABLET | Freq: Every day | ORAL | Status: DC

## 2011-04-17 ENCOUNTER — Other Ambulatory Visit: Payer: Self-pay | Admitting: Internal Medicine

## 2011-04-17 ENCOUNTER — Ambulatory Visit (INDEPENDENT_AMBULATORY_CARE_PROVIDER_SITE_OTHER): Payer: 59 | Admitting: *Deleted

## 2011-04-17 ENCOUNTER — Encounter: Payer: Self-pay | Admitting: Internal Medicine

## 2011-04-17 DIAGNOSIS — Z95810 Presence of automatic (implantable) cardiac defibrillator: Secondary | ICD-10-CM

## 2011-04-17 DIAGNOSIS — I428 Other cardiomyopathies: Secondary | ICD-10-CM

## 2011-04-25 NOTE — Progress Notes (Signed)
icd remote check  

## 2011-05-13 ENCOUNTER — Encounter: Payer: Self-pay | Admitting: *Deleted

## 2011-08-14 ENCOUNTER — Ambulatory Visit: Payer: Self-pay | Admitting: Family Medicine

## 2011-08-14 ENCOUNTER — Ambulatory Visit: Payer: Self-pay | Admitting: Cardiology

## 2011-08-22 ENCOUNTER — Ambulatory Visit: Payer: Self-pay | Admitting: Cardiology

## 2011-08-28 ENCOUNTER — Encounter: Payer: Self-pay | Admitting: *Deleted

## 2011-09-04 ENCOUNTER — Ambulatory Visit (INDEPENDENT_AMBULATORY_CARE_PROVIDER_SITE_OTHER): Payer: BC Managed Care – PPO | Admitting: Internal Medicine

## 2011-09-04 ENCOUNTER — Encounter: Payer: Self-pay | Admitting: Internal Medicine

## 2011-09-04 VITALS — BP 133/70 | HR 68 | Ht 64.0 in | Wt 285.1 lb

## 2011-09-04 DIAGNOSIS — I5022 Chronic systolic (congestive) heart failure: Secondary | ICD-10-CM

## 2011-09-04 DIAGNOSIS — G478 Other sleep disorders: Secondary | ICD-10-CM

## 2011-09-04 DIAGNOSIS — R0683 Snoring: Secondary | ICD-10-CM

## 2011-09-04 DIAGNOSIS — R0989 Other specified symptoms and signs involving the circulatory and respiratory systems: Secondary | ICD-10-CM

## 2011-09-04 DIAGNOSIS — E669 Obesity, unspecified: Secondary | ICD-10-CM

## 2011-09-04 DIAGNOSIS — Z4502 Encounter for adjustment and management of automatic implantable cardiac defibrillator: Secondary | ICD-10-CM | POA: Insufficient documentation

## 2011-09-04 DIAGNOSIS — Z95810 Presence of automatic (implantable) cardiac defibrillator: Secondary | ICD-10-CM

## 2011-09-04 DIAGNOSIS — R0609 Other forms of dyspnea: Secondary | ICD-10-CM

## 2011-09-04 DIAGNOSIS — I428 Other cardiomyopathies: Secondary | ICD-10-CM

## 2011-09-04 HISTORY — DX: Snoring: R06.83

## 2011-09-04 HISTORY — DX: Other sleep disorders: G47.8

## 2011-09-04 LAB — ICD DEVICE OBSERVATION
BATTERY VOLTAGE: 2.93 V
BRDY-0002RV: 40 {beats}/min
CHARGE TIME: 8.66 s
DEV-0020ICD: NEGATIVE
FVT: 0
PACEART VT: 0
RV LEAD AMPLITUDE: 16.7 mv
RV LEAD IMPEDENCE ICD: 584 Ohm
RV LEAD THRESHOLD: 1 V
TZAT-0001FASTVT: 1
TZAT-0001SLOWVT: 1
TZAT-0001SLOWVT: 2
TZAT-0004FASTVT: 8
TZAT-0004SLOWVT: 8
TZAT-0004SLOWVT: 8
TZAT-0005FASTVT: 88 pct
TZAT-0005SLOWVT: 84 pct
TZAT-0005SLOWVT: 91 pct
TZAT-0011FASTVT: 10 ms
TZAT-0011SLOWVT: 10 ms
TZAT-0011SLOWVT: 10 ms
TZAT-0012FASTVT: 200 ms
TZAT-0012SLOWVT: 200 ms
TZAT-0012SLOWVT: 200 ms
TZAT-0013FASTVT: 1
TZAT-0013SLOWVT: 2
TZAT-0013SLOWVT: 2
TZAT-0018FASTVT: NEGATIVE
TZAT-0018SLOWVT: NEGATIVE
TZAT-0018SLOWVT: NEGATIVE
TZAT-0019FASTVT: 8 V
TZAT-0019SLOWVT: 8 V
TZAT-0019SLOWVT: 8 V
TZAT-0020FASTVT: 1.6 ms
TZAT-0020SLOWVT: 1.6 ms
TZAT-0020SLOWVT: 1.6 ms
TZON-0003FASTVT: 250 ms
TZON-0003SLOWVT: 330 ms
TZON-0004SLOWVT: 16
TZON-0005SLOWVT: 12
TZON-0008FASTVT: 0 ms
TZON-0008SLOWVT: 0 ms
TZON-0010AFLUTTER: 30 ms
TZON-0010FASTVT: 30 ms
TZON-0010SLOWVT: 30 ms
TZON-0010VSLOWVT: 30 ms
TZON-0011AFLUTTER: 70
TZST-0001FASTVT: 2
TZST-0001FASTVT: 3
TZST-0001FASTVT: 4
TZST-0001FASTVT: 5
TZST-0001FASTVT: 6
TZST-0001SLOWVT: 3
TZST-0001SLOWVT: 4
TZST-0001SLOWVT: 5
TZST-0001SLOWVT: 6
TZST-0003FASTVT: 30 J
TZST-0003FASTVT: 35 J
TZST-0003FASTVT: 35 J
TZST-0003FASTVT: 35 J
TZST-0003FASTVT: 35 J
TZST-0003SLOWVT: 20 J
TZST-0003SLOWVT: 30 J
TZST-0003SLOWVT: 35 J
TZST-0003SLOWVT: 35 J
VENTRICULAR PACING ICD: 0 pct
VF: 0

## 2011-09-04 NOTE — Progress Notes (Signed)
  HPI  [name] is a 41 y.o. female Seen in followup for defibrillator  implanted for nonischemic myopathy and did have improvement in her LVEF  by your note as far as up to 35-40%. She has no complaints at chest pain  She has shortness of breath, nocturnal dyspnea and recumbent cough; she has some edema. She also has modest fatigue this has been getting worse. She has put on about 50 pounds over the last year. She now has significant snoring and more daytime fatigue and somnolence   ECHO just recently was about 30%  Past Medical History  Diagnosis Date  . Hypertension   . Nonischemic cardiomyopathy   . Sinus tachycardia     Inappropriate therapy for  . Nocturnal dyspnea   . Chronic CHF (congestive heart failure)     class 3  . Weight increase     Past Surgical History  Procedure Date  . Cardiac pacemaker placement     Medtronic Maximo-VR-7332CX cardioverter defibrillator    Current Outpatient Prescriptions  Medication Sig Dispense Refill  . carvedilol (COREG) 25 MG tablet Take 25 mg by mouth 2 (two) times daily with a meal.      . furosemide (LASIX) 40 MG tablet Take 40 mg by mouth daily.      Marland Kitchen levothyroxine (SYNTHROID, LEVOTHROID) 50 MCG tablet Take 75 mcg by mouth daily.       Marland Kitchen lisinopril (PRINIVIL,ZESTRIL) 10 MG tablet Take 10 mg by mouth daily.      . multivitamin (THERAGRAN) per tablet Take 1 tablet by mouth daily.      Marland Kitchen spironolactone (ALDACTONE) 25 MG tablet Take 1 tablet (25 mg total) by mouth daily.  30 tablet  3    No Known Allergies  Review of Systems negative except from HPI and PMH  Physical Exam BP 133/70  Pulse 68  Ht 5\' 4"  (1.626 m)  Wt 285 lb 1.9 oz (129.33 kg)  BMI 48.94 kg/m2 Well developed and nourished in no acute distress HENT normal Neck supple with JVP-9-10 Clear Regular rate and rhythm, no murmurs or gallops Abd-soft with active BS No Clubbing cyanosis tr edema Skin-warm and dry A & Oriented  Grossly normal sensory and motor  function    Assessment and  Plan

## 2011-09-04 NOTE — Assessment & Plan Note (Signed)
As above QRS is narrow as a year ago; CRT is not an option

## 2011-09-04 NOTE — Patient Instructions (Signed)
Your physician has recommended you make the following change in your medication:  1) Increase lasix (furosemide) to 40 mg to twice daily every other day for the next 2 weeks.  Remote monitoring is used to monitor your Pacemaker of ICD from home. This monitoring reduces the number of office visits required to check your device to one time per year. It allows [name] to keep an eye on the functioning of your device to ensure it is working properly. You are scheduled for a device check from home on 12/04/11. You may send your transmission at any time that day. If you have a wireless device, the transmission will be sent automatically. After your physician reviews your transmission, you will receive a postcard with your next transmission date.  Your physician wants you to follow-up in: 1 year with Dr. Graciela Husbands. You will receive a reminder letter in the mail two months in advance. If you don't receive a letter, please call our office to schedule the follow-up appointment.

## 2011-09-04 NOTE — Assessment & Plan Note (Signed)
Patient has progressive issues with shortness of breath which are part related to weight but I think there is also evidence of volume overload at least on the right side and by symptoms on the left side. We'll increase her diuretics. He'll let us know how does and she feels I've asked her to follow up with Dr. A-P at [facility].

## 2011-09-04 NOTE — Assessment & Plan Note (Signed)
The patient's device was interrogated.  The information was reviewed. No changes were made in the programming.    

## 2011-09-04 NOTE — Assessment & Plan Note (Signed)
Have asked that she see her PCP for sleep study

## 2011-09-04 NOTE — Assessment & Plan Note (Signed)
Have encouraged her to consider weight reduction surgery

## 2011-09-29 ENCOUNTER — Other Ambulatory Visit: Payer: Self-pay | Admitting: *Deleted

## 2011-09-29 MED ORDER — SPIRONOLACTONE 25 MG PO TABS: 25.0000 mg | ORAL_TABLET | Freq: Every day | ORAL | Status: DC

## 2011-12-04 ENCOUNTER — Ambulatory Visit (INDEPENDENT_AMBULATORY_CARE_PROVIDER_SITE_OTHER): Payer: BC Managed Care – PPO | Admitting: *Deleted

## 2011-12-04 DIAGNOSIS — Z95810 Presence of automatic (implantable) cardiac defibrillator: Secondary | ICD-10-CM

## 2011-12-04 DIAGNOSIS — I5022 Chronic systolic (congestive) heart failure: Secondary | ICD-10-CM

## 2011-12-12 LAB — REMOTE ICD DEVICE
BATTERY VOLTAGE: 2.86 V
BRDY-0002RV: 40 {beats}/min
CHARGE TIME: 9.02 s
DEV-0020ICD: NEGATIVE
RV LEAD AMPLITUDE: 16.3 mv
RV LEAD IMPEDENCE ICD: 560 Ohm
TOT-0006: 20130314000000
TZAT-0001FASTVT: 1
TZAT-0001SLOWVT: 1
TZAT-0001SLOWVT: 2
TZAT-0004FASTVT: 8
TZAT-0004SLOWVT: 8
TZAT-0004SLOWVT: 8
TZAT-0005FASTVT: 88 pct
TZAT-0005SLOWVT: 84 pct
TZAT-0005SLOWVT: 91 pct
TZAT-0011FASTVT: 10 ms
TZAT-0011SLOWVT: 10 ms
TZAT-0011SLOWVT: 10 ms
TZAT-0012FASTVT: 200 ms
TZAT-0012SLOWVT: 200 ms
TZAT-0012SLOWVT: 200 ms
TZAT-0013FASTVT: 1
TZAT-0013SLOWVT: 2
TZAT-0013SLOWVT: 2
TZAT-0018FASTVT: NEGATIVE
TZAT-0018SLOWVT: NEGATIVE
TZAT-0018SLOWVT: NEGATIVE
TZAT-0019FASTVT: 8 V
TZAT-0019SLOWVT: 8 V
TZAT-0019SLOWVT: 8 V
TZAT-0020FASTVT: 1.6 ms
TZAT-0020SLOWVT: 1.6 ms
TZAT-0020SLOWVT: 1.6 ms
TZON-0003FASTVT: 250 ms
TZON-0003SLOWVT: 330 ms
TZON-0004SLOWVT: 16
TZON-0005SLOWVT: 12
TZON-0008FASTVT: 0 ms
TZON-0008SLOWVT: 0 ms
TZON-0010AFLUTTER: 30 ms
TZON-0010FASTVT: 30 ms
TZON-0010SLOWVT: 30 ms
TZON-0011AFLUTTER: 70
TZST-0001FASTVT: 2
TZST-0001FASTVT: 3
TZST-0001FASTVT: 4
TZST-0001FASTVT: 5
TZST-0001FASTVT: 6
TZST-0001SLOWVT: 3
TZST-0001SLOWVT: 4
TZST-0001SLOWVT: 5
TZST-0001SLOWVT: 6
TZST-0003FASTVT: 30 J
TZST-0003FASTVT: 35 J
TZST-0003FASTVT: 35 J
TZST-0003FASTVT: 35 J
TZST-0003FASTVT: 35 J
TZST-0003SLOWVT: 20 J
TZST-0003SLOWVT: 30 J
TZST-0003SLOWVT: 35 J
TZST-0003SLOWVT: 35 J
VENTRICULAR PACING ICD: 0 pct

## 2011-12-16 ENCOUNTER — Encounter: Payer: Self-pay | Admitting: *Deleted

## 2012-03-04 ENCOUNTER — Ambulatory Visit (INDEPENDENT_AMBULATORY_CARE_PROVIDER_SITE_OTHER): Payer: BC Managed Care – PPO | Admitting: *Deleted

## 2012-03-04 DIAGNOSIS — I428 Other cardiomyopathies: Secondary | ICD-10-CM

## 2012-03-09 LAB — REMOTE ICD DEVICE
BATTERY VOLTAGE: 2.82 V
BRDY-0002RV: 40 {beats}/min
CHARGE TIME: 9.02 s
DEV-0020ICD: NEGATIVE
RV LEAD AMPLITUDE: 17.8 mv
RV LEAD IMPEDENCE ICD: 608 Ohm
TOT-0006: 20130613000000
TZAT-0001FASTVT: 1
TZAT-0001SLOWVT: 1
TZAT-0001SLOWVT: 2
TZAT-0004FASTVT: 8
TZAT-0004SLOWVT: 8
TZAT-0004SLOWVT: 8
TZAT-0005FASTVT: 88 pct
TZAT-0005SLOWVT: 84 pct
TZAT-0005SLOWVT: 91 pct
TZAT-0011FASTVT: 10 ms
TZAT-0011SLOWVT: 10 ms
TZAT-0011SLOWVT: 10 ms
TZAT-0012FASTVT: 200 ms
TZAT-0012SLOWVT: 200 ms
TZAT-0012SLOWVT: 200 ms
TZAT-0013FASTVT: 1
TZAT-0013SLOWVT: 2
TZAT-0013SLOWVT: 2
TZAT-0018FASTVT: NEGATIVE
TZAT-0018SLOWVT: NEGATIVE
TZAT-0018SLOWVT: NEGATIVE
TZAT-0019FASTVT: 8 V
TZAT-0019SLOWVT: 8 V
TZAT-0019SLOWVT: 8 V
TZAT-0020FASTVT: 1.6 ms
TZAT-0020SLOWVT: 1.6 ms
TZAT-0020SLOWVT: 1.6 ms
TZON-0003FASTVT: 250 ms
TZON-0003SLOWVT: 330 ms
TZON-0004SLOWVT: 16
TZON-0005SLOWVT: 12
TZON-0008FASTVT: 0 ms
TZON-0008SLOWVT: 0 ms
TZON-0010AFLUTTER: 30 ms
TZON-0010FASTVT: 30 ms
TZON-0010SLOWVT: 30 ms
TZON-0011AFLUTTER: 70
TZST-0001FASTVT: 2
TZST-0001FASTVT: 3
TZST-0001FASTVT: 4
TZST-0001FASTVT: 5
TZST-0001FASTVT: 6
TZST-0001SLOWVT: 3
TZST-0001SLOWVT: 4
TZST-0001SLOWVT: 5
TZST-0001SLOWVT: 6
TZST-0003FASTVT: 30 J
TZST-0003FASTVT: 35 J
TZST-0003FASTVT: 35 J
TZST-0003FASTVT: 35 J
TZST-0003FASTVT: 35 J
TZST-0003SLOWVT: 20 J
TZST-0003SLOWVT: 30 J
TZST-0003SLOWVT: 35 J
TZST-0003SLOWVT: 35 J
VENTRICULAR PACING ICD: 0 pct

## 2012-04-06 ENCOUNTER — Encounter: Payer: Self-pay | Admitting: *Deleted

## 2012-04-16 ENCOUNTER — Encounter: Payer: Self-pay | Admitting: Internal Medicine

## 2012-06-14 ENCOUNTER — Encounter: Payer: BC Managed Care – PPO | Admitting: *Deleted

## 2012-06-22 ENCOUNTER — Encounter: Payer: Self-pay | Admitting: *Deleted

## 2012-07-01 ENCOUNTER — Other Ambulatory Visit: Payer: Self-pay | Admitting: Family Medicine

## 2012-07-01 LAB — PRO B NATRIURETIC PEPTIDE: B-Type Natriuretic Peptide: 870 pg/mL — ABNORMAL HIGH (ref 0–125)

## 2012-07-23 ENCOUNTER — Encounter: Payer: Self-pay | Admitting: *Deleted

## 2012-08-03 ENCOUNTER — Other Ambulatory Visit: Payer: Self-pay | Admitting: Internal Medicine

## 2012-08-17 ENCOUNTER — Ambulatory Visit: Payer: Self-pay | Admitting: Family Medicine

## 2012-09-21 ENCOUNTER — Encounter: Payer: BC Managed Care – PPO | Admitting: Cardiology

## 2012-09-23 ENCOUNTER — Encounter: Payer: Self-pay | Admitting: Internal Medicine

## 2012-09-23 ENCOUNTER — Encounter: Payer: Self-pay | Admitting: Cardiology

## 2012-09-23 ENCOUNTER — Ambulatory Visit (INDEPENDENT_AMBULATORY_CARE_PROVIDER_SITE_OTHER): Payer: BC Managed Care – PPO | Admitting: Cardiology

## 2012-09-23 VITALS — BP 110/76 | HR 85 | Ht 64.0 in | Wt 288.0 lb

## 2012-09-23 DIAGNOSIS — I5022 Chronic systolic (congestive) heart failure: Secondary | ICD-10-CM

## 2012-09-23 DIAGNOSIS — I428 Other cardiomyopathies: Secondary | ICD-10-CM

## 2012-09-23 DIAGNOSIS — Z95810 Presence of automatic (implantable) cardiac defibrillator: Secondary | ICD-10-CM

## 2012-09-23 LAB — ICD DEVICE OBSERVATION
BATTERY VOLTAGE: 2.69 V
BRDY-0002RV: 40 {beats}/min
DEV-0020ICD: NEGATIVE
FVT: 1
PACEART VT: 0
RV LEAD AMPLITUDE: 17.1 mv
RV LEAD IMPEDENCE ICD: 552 Ohm
RV LEAD THRESHOLD: 3 V
TZAT-0001FASTVT: 1
TZAT-0001SLOWVT: 1
TZAT-0001SLOWVT: 2
TZAT-0004FASTVT: 8
TZAT-0004SLOWVT: 8
TZAT-0004SLOWVT: 8
TZAT-0005FASTVT: 88 pct
TZAT-0005SLOWVT: 84 pct
TZAT-0005SLOWVT: 91 pct
TZAT-0011FASTVT: 10 ms
TZAT-0011SLOWVT: 10 ms
TZAT-0011SLOWVT: 10 ms
TZAT-0012FASTVT: 200 ms
TZAT-0012SLOWVT: 200 ms
TZAT-0012SLOWVT: 200 ms
TZAT-0013FASTVT: 1
TZAT-0013SLOWVT: 2
TZAT-0013SLOWVT: 2
TZAT-0018FASTVT: NEGATIVE
TZAT-0018SLOWVT: NEGATIVE
TZAT-0018SLOWVT: NEGATIVE
TZAT-0019FASTVT: 8 V
TZAT-0019SLOWVT: 8 V
TZAT-0019SLOWVT: 8 V
TZAT-0020FASTVT: 1.6 ms
TZAT-0020SLOWVT: 1.6 ms
TZAT-0020SLOWVT: 1.6 ms
TZON-0003FASTVT: 250 ms
TZON-0003SLOWVT: 330 ms
TZON-0004SLOWVT: 16
TZON-0005SLOWVT: 12
TZON-0008FASTVT: 0 ms
TZON-0008SLOWVT: 0 ms
TZON-0010AFLUTTER: 30 ms
TZON-0010FASTVT: 30 ms
TZON-0010SLOWVT: 30 ms
TZON-0011AFLUTTER: 70
TZST-0001FASTVT: 2
TZST-0001FASTVT: 3
TZST-0001FASTVT: 4
TZST-0001FASTVT: 5
TZST-0001FASTVT: 6
TZST-0001SLOWVT: 3
TZST-0001SLOWVT: 4
TZST-0001SLOWVT: 5
TZST-0001SLOWVT: 6
TZST-0003FASTVT: 30 J
TZST-0003FASTVT: 35 J
TZST-0003FASTVT: 35 J
TZST-0003FASTVT: 35 J
TZST-0003FASTVT: 35 J
TZST-0003SLOWVT: 20 J
TZST-0003SLOWVT: 30 J
TZST-0003SLOWVT: 35 J
TZST-0003SLOWVT: 35 J
VENTRICULAR PACING ICD: 0 pct
VF: 0

## 2012-09-23 NOTE — Progress Notes (Signed)
ELECTROPHYSIOLOGY OFFICE NOTE  Patient ID: [name] MRN: [id number], DOB/AGE: 42-Jan-1972   Date of Visit: 09/23/2012  Primary Physician: Dorothey Baseman, MD Primary Cardiologist: Cassie Freer, MD Reason for Visit: EP/device follow-up  History of Present Illness  [name] is a pleasant 42 year old woman with a NICM s/p ICD implant, chronic systolic HF and HTN who presents today for routine electrophysiology followup. Since last being seen in our clinic, she reports she is doing well. Her heart failure/diuretic therapy is being managed by Dr. Terance Hart and Dr. Cassie Freer. She has intermittent LE swelling but denies any worsening. She avoids using salt but eats out frequently so is likely getting more sodium than she should from fast food. She is trying to lose weight. She has no complaints today. She specifically denies chest pain or shortness of breath. She denies palpitations, dizziness, near syncope or syncope. She denies LE swelling, orthopnea or PND. [name] reports she is compliant and tolerating medications without difficulty.  Past Medical History Past Medical History  Diagnosis Date  . Hypertension   . Nonischemic cardiomyopathy   . Sinus tachycardia     Inappropriate therapy for  . Nocturnal dyspnea   . Chronic CHF (congestive heart failure)     class 3  . Morbid obesity   . ICD (implantable cardiac defibrillator) MDT   . Snoring-prob OSA 09/04/2011    Past Surgical History Past Surgical History  Procedure Laterality Date  . Cardiac pacemaker placement      Medtronic Maximo-VR-[id number] cardioverter defibrillator     Allergies/Intolerances No Known Allergies  Current Home Medications Current Outpatient Prescriptions  Medication Sig Dispense Refill  . carvedilol (COREG) 25 MG tablet Take 25 mg by mouth 2 (two) times daily with a meal.      . furosemide (LASIX) 40 MG tablet Take 80 mg by mouth daily.       Marland Kitchen levothyroxine (SYNTHROID, LEVOTHROID) 88 MCG tablet Take 88  mcg by mouth daily before breakfast.      . lisinopril (PRINIVIL,ZESTRIL) 10 MG tablet Take 10 mg by mouth daily.      . multivitamin (THERAGRAN) per tablet Take 1 tablet by mouth daily.      Marland Kitchen spironolactone (ALDACTONE) 25 MG tablet TAKE ONE TABLET BY MOUTH ONE TIME DAILY  30 tablet  2   No current facility-administered medications for this visit.    Social History Social History  . Marital Status: Married   Social History Main Topics  . Smoking status: Never Smoker   . Smokeless tobacco: No  . Alcohol Use: No  . Drug Use: No   Review of Systems General: No chills, fever, night sweats or weight changes Cardiovascular: No chest pain, dyspnea on exertion, edema, orthopnea, palpitations, paroxysmal nocturnal dyspnea Dermatological: No rash, lesions or masses Respiratory: No cough, dyspnea Urologic: No hematuria, dysuria Abdominal: No nausea, vomiting, diarrhea, bright red blood per rectum, melena, or hematemesis Neurologic: No visual changes, weakness, changes in mental status All other systems reviewed and are otherwise negative except as noted above.  Physical Exam Blood pressure 110/76, pulse 85, height 5\' 4"  (1.626 m), weight 288 lb (130.636 kg), SpO2 98.00%.  General: Well developed, well appearing 42 year old female in no acute distress. HEENT: Normocephalic, atraumatic. EOMs intact. Sclera nonicteric. Oropharynx clear.  Neck: Supple. No JVD. Lungs: Respirations regular and unlabored, CTA bilaterally. No wheezes, rales or rhonchi. Heart: RRR. S1, S2 present. No murmurs, rub, S3 or S4. Abdomen: Soft, non-distended.  Extremities: No clubbing, cyanosis  or edema. DP/PT/Radials 2+ and equal bilaterally. Psych: Normal affect. Neuro: Alert and oriented X 3. Moves all extremities spontaneously.   Diagnostics Device interrogation today - Normal device function. Thresholds and sensing consistent with previous device measurements. Impedance trends stable over time. 1 FVT episode  successfully terminated with ATP x1. 2 NST episodes, no EGMs available for review. No changes made this session.   Assessment and Plan 1. NICM s/p ICD implant Normal device function  No programming changes made Continue routine remote device follow-up every 3 months Return to clinic for follow-up with Dr. Graciela Husbands in one year 2. Paroxysmal VT Asymptomatic; brief, infrequent Dr. Graciela Husbands to review EGM Continue BB 3. Chronic systolic HF Stable; followed by Dr. Cassie Freer  Continue medical therapy  Signed, Rick Duff, PA-C 09/23/2012, 11:37 AM

## 2012-09-23 NOTE — Patient Instructions (Addendum)
Remote monitoring is used to monitor your Pacemaker of ICD from home. This monitoring reduces the number of office visits required to check your device to one time per year. It allows [name] to keep an eye on the functioning of your device to ensure it is working properly. You are scheduled for a device check from home on 12/27/12. You may send your transmission at any time that day. If you have a wireless device, the transmission will be sent automatically. After your physician reviews your transmission, you will receive a postcard with your next transmission date.  Your physician wants you to follow-up in: 1 year with Dr Logan Bores will receive a reminder letter in the mail two months in advance. If you don't receive a letter, please call our office to schedule the follow-up appointment.

## 2012-12-15 ENCOUNTER — Other Ambulatory Visit: Payer: Self-pay | Admitting: Internal Medicine

## 2012-12-27 ENCOUNTER — Encounter: Payer: BC Managed Care – PPO | Admitting: *Deleted

## 2013-01-07 ENCOUNTER — Encounter: Payer: Self-pay | Admitting: *Deleted

## 2013-01-14 ENCOUNTER — Ambulatory Visit (INDEPENDENT_AMBULATORY_CARE_PROVIDER_SITE_OTHER): Payer: BC Managed Care – PPO | Admitting: *Deleted

## 2013-01-14 DIAGNOSIS — Z95810 Presence of automatic (implantable) cardiac defibrillator: Secondary | ICD-10-CM

## 2013-01-14 DIAGNOSIS — I428 Other cardiomyopathies: Secondary | ICD-10-CM

## 2013-01-27 LAB — REMOTE ICD DEVICE
BATTERY VOLTAGE: 2.65 V
BRDY-0002RV: 40 {beats}/min
CHARGE TIME: 10.34 s
DEV-0020ICD: NEGATIVE
RV LEAD AMPLITUDE: 15 mv
RV LEAD IMPEDENCE ICD: 560 Ohm
TOT-0006: 20140403000000
TZAT-0001FASTVT: 1
TZAT-0001SLOWVT: 1
TZAT-0001SLOWVT: 2
TZAT-0004FASTVT: 8
TZAT-0004SLOWVT: 8
TZAT-0004SLOWVT: 8
TZAT-0005FASTVT: 88 pct
TZAT-0005SLOWVT: 84 pct
TZAT-0005SLOWVT: 91 pct
TZAT-0011FASTVT: 10 ms
TZAT-0011SLOWVT: 10 ms
TZAT-0011SLOWVT: 10 ms
TZAT-0012FASTVT: 200 ms
TZAT-0012SLOWVT: 200 ms
TZAT-0012SLOWVT: 200 ms
TZAT-0013FASTVT: 1
TZAT-0013SLOWVT: 2
TZAT-0013SLOWVT: 2
TZAT-0018FASTVT: NEGATIVE
TZAT-0018SLOWVT: NEGATIVE
TZAT-0018SLOWVT: NEGATIVE
TZAT-0019FASTVT: 8 V
TZAT-0019SLOWVT: 8 V
TZAT-0019SLOWVT: 8 V
TZAT-0020FASTVT: 1.6 ms
TZAT-0020SLOWVT: 1.6 ms
TZAT-0020SLOWVT: 1.6 ms
TZON-0003FASTVT: 250 ms
TZON-0003SLOWVT: 330 ms
TZON-0004SLOWVT: 16
TZON-0005SLOWVT: 12
TZON-0008FASTVT: 0 ms
TZON-0008SLOWVT: 0 ms
TZON-0010AFLUTTER: 30 ms
TZON-0010FASTVT: 30 ms
TZON-0010SLOWVT: 30 ms
TZON-0011AFLUTTER: 70
TZST-0001FASTVT: 2
TZST-0001FASTVT: 3
TZST-0001FASTVT: 4
TZST-0001FASTVT: 5
TZST-0001FASTVT: 6
TZST-0001SLOWVT: 3
TZST-0001SLOWVT: 4
TZST-0001SLOWVT: 5
TZST-0001SLOWVT: 6
TZST-0003FASTVT: 30 J
TZST-0003FASTVT: 35 J
TZST-0003FASTVT: 35 J
TZST-0003FASTVT: 35 J
TZST-0003FASTVT: 35 J
TZST-0003SLOWVT: 20 J
TZST-0003SLOWVT: 30 J
TZST-0003SLOWVT: 35 J
TZST-0003SLOWVT: 35 J

## 2013-02-07 ENCOUNTER — Encounter: Payer: Self-pay | Admitting: Internal Medicine

## 2013-04-25 ENCOUNTER — Ambulatory Visit (INDEPENDENT_AMBULATORY_CARE_PROVIDER_SITE_OTHER): Payer: BC Managed Care – PPO | Admitting: *Deleted

## 2013-04-25 DIAGNOSIS — Z95810 Presence of automatic (implantable) cardiac defibrillator: Secondary | ICD-10-CM

## 2013-04-25 DIAGNOSIS — I428 Other cardiomyopathies: Secondary | ICD-10-CM

## 2013-04-26 LAB — REMOTE ICD DEVICE
BATTERY VOLTAGE: 2.65 V
BRDY-0002RV: 40 {beats}/min
CHARGE TIME: 10.96 s
DEV-0020ICD: NEGATIVE
RV LEAD AMPLITUDE: 17.2 mv
RV LEAD IMPEDENCE ICD: 536 Ohm
TOT-0006: 20140725000000
TZAT-0001FASTVT: 1
TZAT-0001SLOWVT: 1
TZAT-0001SLOWVT: 2
TZAT-0004FASTVT: 8
TZAT-0004SLOWVT: 8
TZAT-0004SLOWVT: 8
TZAT-0005FASTVT: 88 pct
TZAT-0005SLOWVT: 84 pct
TZAT-0005SLOWVT: 91 pct
TZAT-0011FASTVT: 10 ms
TZAT-0011SLOWVT: 10 ms
TZAT-0011SLOWVT: 10 ms
TZAT-0012FASTVT: 200 ms
TZAT-0012SLOWVT: 200 ms
TZAT-0012SLOWVT: 200 ms
TZAT-0013FASTVT: 1
TZAT-0013SLOWVT: 2
TZAT-0013SLOWVT: 2
TZAT-0018FASTVT: NEGATIVE
TZAT-0018SLOWVT: NEGATIVE
TZAT-0018SLOWVT: NEGATIVE
TZAT-0019FASTVT: 8 V
TZAT-0019SLOWVT: 8 V
TZAT-0019SLOWVT: 8 V
TZAT-0020FASTVT: 1.6 ms
TZAT-0020SLOWVT: 1.6 ms
TZAT-0020SLOWVT: 1.6 ms
TZON-0003FASTVT: 250 ms
TZON-0003SLOWVT: 330 ms
TZON-0004SLOWVT: 16
TZON-0005SLOWVT: 12
TZON-0008FASTVT: 0 ms
TZON-0008SLOWVT: 0 ms
TZON-0010AFLUTTER: 30 ms
TZON-0010FASTVT: 30 ms
TZON-0010SLOWVT: 30 ms
TZON-0011AFLUTTER: 70
TZST-0001FASTVT: 2
TZST-0001FASTVT: 3
TZST-0001FASTVT: 4
TZST-0001FASTVT: 5
TZST-0001FASTVT: 6
TZST-0001SLOWVT: 3
TZST-0001SLOWVT: 4
TZST-0001SLOWVT: 5
TZST-0001SLOWVT: 6
TZST-0003FASTVT: 30 J
TZST-0003FASTVT: 35 J
TZST-0003FASTVT: 35 J
TZST-0003FASTVT: 35 J
TZST-0003FASTVT: 35 J
TZST-0003SLOWVT: 20 J
TZST-0003SLOWVT: 30 J
TZST-0003SLOWVT: 35 J
TZST-0003SLOWVT: 35 J

## 2013-06-01 ENCOUNTER — Encounter: Payer: Self-pay | Admitting: *Deleted

## 2013-06-06 ENCOUNTER — Encounter: Payer: Self-pay | Admitting: Internal Medicine

## 2013-06-24 ENCOUNTER — Ambulatory Visit: Payer: Self-pay | Admitting: Family Medicine

## 2013-07-27 ENCOUNTER — Ambulatory Visit (INDEPENDENT_AMBULATORY_CARE_PROVIDER_SITE_OTHER): Payer: BC Managed Care – PPO | Admitting: *Deleted

## 2013-07-27 DIAGNOSIS — I428 Other cardiomyopathies: Secondary | ICD-10-CM

## 2013-08-03 LAB — MDC_IDC_ENUM_SESS_TYPE_REMOTE
Brady Statistic RV Percent Paced: 0 %
Lead Channel Impedance Value: 496 Ohm
Lead Channel Sensing Intrinsic Amplitude: 14.7 mV
Lead Channel Setting Pacing Amplitude: 2.5 V
Lead Channel Setting Pacing Pulse Width: 0.4 ms
Lead Channel Setting Sensing Sensitivity: 0.3 mV
Zone Setting Detection Interval: 250 ms
Zone Setting Detection Interval: 300 ms
Zone Setting Detection Interval: 330 ms

## 2013-08-17 ENCOUNTER — Encounter: Payer: Self-pay | Admitting: *Deleted

## 2013-08-21 DIAGNOSIS — I219 Acute myocardial infarction, unspecified: Secondary | ICD-10-CM

## 2013-08-21 HISTORY — DX: Acute myocardial infarction, unspecified: I21.9

## 2013-09-06 ENCOUNTER — Encounter: Payer: Self-pay | Admitting: Internal Medicine

## 2013-09-19 ENCOUNTER — Inpatient Hospital Stay: Payer: Self-pay | Admitting: Internal Medicine

## 2013-09-19 LAB — COMPREHENSIVE METABOLIC PANEL
Albumin: 3.5 g/dL (ref 3.4–5.0)
Alkaline Phosphatase: 65 U/L
Anion Gap: 5 — ABNORMAL LOW (ref 7–16)
BUN: 15 mg/dL (ref 7–18)
Bilirubin,Total: 0.4 mg/dL (ref 0.2–1.0)
Calcium, Total: 8.8 mg/dL (ref 8.5–10.1)
Chloride: 109 mmol/L — ABNORMAL HIGH (ref 98–107)
Co2: 24 mmol/L (ref 21–32)
Creatinine: 1.23 mg/dL (ref 0.60–1.30)
EGFR (African American): >60
EGFR (Non-African Amer.): 54 — ABNORMAL LOW
Glucose: 124 mg/dL — ABNORMAL HIGH (ref 65–99)
Osmolality: 278 (ref 275–301)
Potassium: 3.6 mmol/L (ref 3.5–5.1)
SGOT(AST): 36 U/L (ref 15–37)
SGPT (ALT): 32 U/L (ref 12–78)
Sodium: 138 mmol/L (ref 136–145)
Total Protein: 7.7 g/dL (ref 6.4–8.2)

## 2013-09-19 LAB — TROPONIN I
Troponin-I: 0.05 ng/mL
Troponin-I: 1.8 ng/mL — ABNORMAL HIGH
Troponin-I: 4.12 ng/mL — ABNORMAL HIGH

## 2013-09-19 LAB — CBC
HCT: 41.2 % (ref 35.0–47.0)
HGB: 13.3 g/dL (ref 12.0–16.0)
MCH: 27.3 pg (ref 26.0–34.0)
MCHC: 32.3 g/dL (ref 32.0–36.0)
MCV: 84 fL (ref 80–100)
Platelet: 286 10*3/uL (ref 150–440)
RBC: 4.88 10*6/uL (ref 3.80–5.20)
RDW: 15.6 % — ABNORMAL HIGH (ref 11.5–14.5)
WBC: 10 10*3/uL (ref 3.6–11.0)

## 2013-09-19 LAB — URINALYSIS, COMPLETE
Bacteria: NONE SEEN
Bilirubin,UR: NEGATIVE
Glucose,UR: NEGATIVE mg/dL (ref 0–75)
Ketone: NEGATIVE
Leukocyte Esterase: NEGATIVE
Nitrite: NEGATIVE
Ph: 5 (ref 4.5–8.0)
Protein: 30
RBC,UR: 4 /HPF (ref 0–5)
Specific Gravity: 1.027 (ref 1.003–1.030)
Squamous Epithelial: 7
WBC UR: NONE SEEN /HPF (ref 0–5)

## 2013-09-19 LAB — PRO B NATRIURETIC PEPTIDE: B-Type Natriuretic Peptide: 1477 pg/mL — ABNORMAL HIGH (ref 0–125)

## 2013-09-19 LAB — CK-MB
CK-MB: 1.5 ng/mL (ref 0.5–3.6)
CK-MB: 16.5 ng/mL — ABNORMAL HIGH (ref 0.5–3.6)
CK-MB: 17.9 ng/mL — ABNORMAL HIGH (ref 0.5–3.6)

## 2013-09-19 LAB — HEMOGLOBIN A1C: Hemoglobin A1C: 5.9 % (ref 4.2–6.3)

## 2013-09-19 LAB — APTT
Activated PTT: 24.8 secs (ref 23.6–35.9)
Activated PTT: 155 secs — ABNORMAL HIGH (ref 23.6–35.9)

## 2013-09-19 LAB — SEDIMENTATION RATE: Erythrocyte Sed Rate: 22 mm/hr — ABNORMAL HIGH (ref 0–20)

## 2013-09-20 LAB — CBC WITH DIFFERENTIAL/PLATELET
Basophil #: 0.1 10*3/uL (ref 0.0–0.1)
Basophil %: 1 %
Eosinophil #: 0.2 10*3/uL (ref 0.0–0.7)
Eosinophil %: 1.8 %
HCT: 38.1 % (ref 35.0–47.0)
HGB: 12.4 g/dL (ref 12.0–16.0)
Lymphocyte #: 3.2 10*3/uL (ref 1.0–3.6)
Lymphocyte %: 38.2 %
MCH: 27.4 pg (ref 26.0–34.0)
MCHC: 32.4 g/dL (ref 32.0–36.0)
MCV: 85 fL (ref 80–100)
Monocyte #: 0.5 x10 3/mm (ref 0.2–0.9)
Monocyte %: 5.5 %
Neutrophil #: 4.5 10*3/uL (ref 1.4–6.5)
Neutrophil %: 53.5 %
Platelet: 235 10*3/uL (ref 150–440)
RBC: 4.52 10*6/uL (ref 3.80–5.20)
RDW: 15.3 % — ABNORMAL HIGH (ref 11.5–14.5)
WBC: 8.4 10*3/uL (ref 3.6–11.0)

## 2013-09-20 LAB — LIPID PANEL
Cholesterol: 183 mg/dL (ref 0–200)
HDL Cholesterol: 40 mg/dL (ref 40–60)
Ldl Cholesterol, Calc: 122 mg/dL — ABNORMAL HIGH (ref 0–100)
Triglycerides: 105 mg/dL (ref 0–200)
VLDL Cholesterol, Calc: 21 mg/dL (ref 5–40)

## 2013-09-20 LAB — BASIC METABOLIC PANEL
Anion Gap: 5 — ABNORMAL LOW (ref 7–16)
BUN: 13 mg/dL (ref 7–18)
Calcium, Total: 8.4 mg/dL — ABNORMAL LOW (ref 8.5–10.1)
Chloride: 107 mmol/L (ref 98–107)
Co2: 25 mmol/L (ref 21–32)
Creatinine: 1.06 mg/dL (ref 0.60–1.30)
EGFR (African American): >60
EGFR (Non-African Amer.): >60
Glucose: 94 mg/dL (ref 65–99)
Osmolality: 274 (ref 275–301)
Potassium: 3.4 mmol/L — ABNORMAL LOW (ref 3.5–5.1)
Sodium: 137 mmol/L (ref 136–145)

## 2013-09-20 LAB — APTT
Activated PTT: 158.3 secs — ABNORMAL HIGH (ref 23.6–35.9)
Activated PTT: 120.4 secs — ABNORMAL HIGH (ref 23.6–35.9)

## 2013-09-20 LAB — MAGNESIUM: Magnesium: 1.7 mg/dL — ABNORMAL LOW

## 2013-09-20 LAB — PRO B NATRIURETIC PEPTIDE: B-Type Natriuretic Peptide: 2583 pg/mL — ABNORMAL HIGH (ref 0–125)

## 2013-09-22 LAB — D-DIMER(ARMC): D-Dimer: 987 ng/ml

## 2013-09-22 LAB — HEMATOCRIT: HCT: 36.5 % (ref 35.0–47.0)

## 2013-09-22 LAB — HEMOGLOBIN: HGB: 11.9 g/dL — ABNORMAL LOW (ref 12.0–16.0)

## 2013-09-22 LAB — PLATELET COUNT: Platelet: 207 10*3/uL (ref 150–440)

## 2013-09-23 LAB — BASIC METABOLIC PANEL
Anion Gap: 6 — ABNORMAL LOW (ref 7–16)
BUN: 11 mg/dL (ref 7–18)
Calcium, Total: 8.7 mg/dL (ref 8.5–10.1)
Chloride: 107 mmol/L (ref 98–107)
Co2: 25 mmol/L (ref 21–32)
Creatinine: 1.01 mg/dL (ref 0.60–1.30)
EGFR (African American): >60
EGFR (Non-African Amer.): >60
Glucose: 78 mg/dL (ref 65–99)
Osmolality: 274 (ref 275–301)
Potassium: 3.8 mmol/L (ref 3.5–5.1)
Sodium: 138 mmol/L (ref 136–145)

## 2013-09-23 LAB — CBC WITH DIFFERENTIAL/PLATELET
Basophil #: 0.1 10*3/uL (ref 0.0–0.1)
Basophil %: 0.6 %
Eosinophil #: 0.1 10*3/uL (ref 0.0–0.7)
Eosinophil %: 1.5 %
HCT: 36.8 % (ref 35.0–47.0)
HGB: 11.9 g/dL — ABNORMAL LOW (ref 12.0–16.0)
Lymphocyte #: 2 10*3/uL (ref 1.0–3.6)
Lymphocyte %: 25 %
MCH: 27.3 pg (ref 26.0–34.0)
MCHC: 32.2 g/dL (ref 32.0–36.0)
MCV: 85 fL (ref 80–100)
Monocyte #: 0.4 x10 3/mm (ref 0.2–0.9)
Monocyte %: 5.2 %
Neutrophil #: 5.3 10*3/uL (ref 1.4–6.5)
Neutrophil %: 67.7 %
Platelet: 195 10*3/uL (ref 150–440)
RBC: 4.34 10*6/uL (ref 3.80–5.20)
RDW: 15.4 % — ABNORMAL HIGH (ref 11.5–14.5)
WBC: 7.9 10*3/uL (ref 3.6–11.0)

## 2013-09-24 LAB — BASIC METABOLIC PANEL
Anion Gap: 6 — ABNORMAL LOW (ref 7–16)
BUN: 11 mg/dL (ref 7–18)
Calcium, Total: 8.6 mg/dL (ref 8.5–10.1)
Chloride: 107 mmol/L (ref 98–107)
Co2: 25 mmol/L (ref 21–32)
Creatinine: 1.08 mg/dL (ref 0.60–1.30)
EGFR (African American): >60
EGFR (Non-African Amer.): >60
Glucose: 82 mg/dL (ref 65–99)
Osmolality: 274 (ref 275–301)
Potassium: 3.7 mmol/L (ref 3.5–5.1)
Sodium: 138 mmol/L (ref 136–145)

## 2013-10-26 ENCOUNTER — Ambulatory Visit (INDEPENDENT_AMBULATORY_CARE_PROVIDER_SITE_OTHER): Payer: BC Managed Care – PPO | Admitting: Internal Medicine

## 2013-10-26 ENCOUNTER — Encounter: Payer: Self-pay | Admitting: Internal Medicine

## 2013-10-26 VITALS — BP 121/82 | HR 81 | Ht 64.0 in | Wt 276.0 lb

## 2013-10-26 DIAGNOSIS — I5022 Chronic systolic (congestive) heart failure: Secondary | ICD-10-CM

## 2013-10-26 DIAGNOSIS — I428 Other cardiomyopathies: Secondary | ICD-10-CM | POA: Diagnosis not present

## 2013-10-26 LAB — MDC_IDC_ENUM_SESS_TYPE_INCLINIC
Battery Voltage: 2.64 V
Brady Statistic RV Percent Paced: 0 %
Date Time Interrogation Session: 20150506180555
HighPow Impedance: 37 Ohm
HighPow Impedance: 39 Ohm
HighPow Impedance: 40 Ohm
HighPow Impedance: 41 Ohm
HighPow Impedance: 41 Ohm
HighPow Impedance: 41 Ohm
HighPow Impedance: 41 Ohm
HighPow Impedance: 41 Ohm
HighPow Impedance: 42 Ohm
HighPow Impedance: 42 Ohm
HighPow Impedance: 42 Ohm
HighPow Impedance: 42 Ohm
HighPow Impedance: 42 Ohm
HighPow Impedance: 43 Ohm
HighPow Impedance: 43 Ohm
HighPow Impedance: 45 Ohm
HighPow Impedance: 49 Ohm
HighPow Impedance: 50 Ohm
HighPow Impedance: 51 Ohm
HighPow Impedance: 51 Ohm
HighPow Impedance: 51 Ohm
HighPow Impedance: 52 Ohm
HighPow Impedance: 52 Ohm
HighPow Impedance: 52 Ohm
HighPow Impedance: 52 Ohm
HighPow Impedance: 53 Ohm
HighPow Impedance: 53 Ohm
HighPow Impedance: 53 Ohm
HighPow Impedance: 53 Ohm
HighPow Impedance: 54 Ohm
HighPow Impedance: 55 Ohm
Lead Channel Impedance Value: 496 Ohm
Lead Channel Impedance Value: 504 Ohm
Lead Channel Impedance Value: 504 Ohm
Lead Channel Impedance Value: 504 Ohm
Lead Channel Impedance Value: 512 Ohm
Lead Channel Impedance Value: 512 Ohm
Lead Channel Impedance Value: 512 Ohm
Lead Channel Impedance Value: 512 Ohm
Lead Channel Impedance Value: 512 Ohm
Lead Channel Impedance Value: 520 Ohm
Lead Channel Impedance Value: 520 Ohm
Lead Channel Impedance Value: 520 Ohm
Lead Channel Impedance Value: 520 Ohm
Lead Channel Impedance Value: 528 Ohm
Lead Channel Impedance Value: 536 Ohm
Lead Channel Pacing Threshold Amplitude: 1.5 V
Lead Channel Pacing Threshold Pulse Width: 0.1 ms
Lead Channel Sensing Intrinsic Amplitude: 13.7 mV
Lead Channel Sensing Intrinsic Amplitude: 13.9 mV
Lead Channel Sensing Intrinsic Amplitude: 14 mV
Lead Channel Sensing Intrinsic Amplitude: 14.1 mV
Lead Channel Sensing Intrinsic Amplitude: 14.1 mV
Lead Channel Sensing Intrinsic Amplitude: 14.5 mV
Lead Channel Sensing Intrinsic Amplitude: 14.6 mV
Lead Channel Sensing Intrinsic Amplitude: 14.8 mV
Lead Channel Sensing Intrinsic Amplitude: 15 mV
Lead Channel Sensing Intrinsic Amplitude: 15.6 mV
Lead Channel Sensing Intrinsic Amplitude: 15.7 mV
Lead Channel Sensing Intrinsic Amplitude: 15.9 mV
Lead Channel Sensing Intrinsic Amplitude: 16.1 mV
Lead Channel Sensing Intrinsic Amplitude: 16.2 mV
Lead Channel Sensing Intrinsic Amplitude: 16.5 mV
Lead Channel Setting Pacing Amplitude: 2.5 V
Lead Channel Setting Pacing Pulse Width: 0.4 ms
Lead Channel Setting Sensing Sensitivity: 0.3 mV
Zone Setting Detection Interval: 250 ms
Zone Setting Detection Interval: 300 ms
Zone Setting Detection Interval: 330 ms

## 2013-10-26 NOTE — Progress Notes (Signed)
Patient Care Team: [name], MD as PCP - General (Family Medicine)   HPI  [name] is a 43 y.o. female Seen in followup for defibrillator implanted for nonischemic myopathy and did have improvement in her LVEF  up to 35-40%. She is followed by the cardiologist at [facility]  She is recently hospitalized with acute on chronic shortness of breath and chest discomfort. A CK-MB was elevated. She underwent catheterization which demonstrated an EF of 15-20%. Fick cardiac index was 1.6 The LVEDP was 34. RA pressure was 15. 2-D echo demonstrated four-chamber enlargement with moderate-severe TR/MR. She felt better shortly after she arrived. She is not having palpitations before then.  Since discharge she has been feeling some better.   ECHO just recently was about 30%   Past Medical History  Diagnosis Date  . Hypertension   . Nonischemic cardiomyopathy   . Sinus tachycardia     Inappropriate therapy for  . Nocturnal dyspnea   . Chronic CHF (congestive heart failure)     class 3  . Morbid obesity   . ICD (implantable cardiac defibrillator) MDT   . Snoring-prob OSA 09/04/2011    Past Surgical History  Procedure Laterality Date  . Cardiac pacemaker placement      Medtronic Maximo-VR-[id number] cardioverter defibrillator    Current Outpatient Prescriptions  Medication Sig Dispense Refill  . aspirin 81 MG tablet Take 81 mg by mouth daily.      Marland Kitchen atorvastatin (LIPITOR) 10 MG tablet daily.      . carvedilol (COREG) 25 MG tablet Take 25 mg by mouth 2 (two) times daily with a meal.      . DIGOX 250 MCG tablet daily before supper. 1/2 tab daily      . furosemide (LASIX) 40 MG tablet Take 80 mg by mouth daily.       Marland Kitchen levothyroxine (SYNTHROID, LEVOTHROID) 88 MCG tablet Take 88 mcg by mouth daily before breakfast.      . lisinopril (PRINIVIL,ZESTRIL) 10 MG tablet Take 10 mg by mouth daily.      . multivitamin (THERAGRAN) per tablet Take 1 tablet by mouth daily.      Marland Kitchen spironolactone  (ALDACTONE) 25 MG tablet TAKE ONE TABLET BY MOUTH ONE TIME DAILY  30 tablet  6   No current facility-administered medications for this visit.    No Known Allergies  Review of Systems negative except from HPI and PMH  Physical Exam BP 121/82  Pulse 81  Ht 5\' 4"  (1.626 m)  Wt 276 lb (125.193 kg)  BMI 47.35 kg/m2 Well developed and well nourished in no acute distress HENT normal E scleral and icterus clear Neck Supple JVP canbnnot disecern carotids brisk and full Clear to ausculation  Regular rate and rhythm, Soft with active bowel sounds No clubbing cyanosis Trace Edema Alert and oriented, grossly normal motor and sensory function Skin Warm and Dry    Assessment and  Plan  Nonischemic cardiac myopathy  Systolic heart failure-chronic  Implantable defibrillator-Medtronic  6949-lead  MR/TR  She has requested a new physician  The patient was very very ill earlier in April. Catheterization parameters were quite striking. I discussed with Dr. Reine Just in the CHF clinic and he will arrange cardiopulmonary stress testing and office visit in 2 weeks. For right now as she is relatively euvolemic and quite stable we'll continue her on her current medications.     She will need in a relatively short term defibrillator generator replacement with revision  of her 6949-lead.

## 2013-10-26 NOTE — Patient Instructions (Addendum)
Your physician recommends that you continue on your current medications as directed. Please refer to the Current Medication list given to you today.  Remote monitoring is used to monitor your Pacemaker of ICD from home. This monitoring reduces the number of office visits required to check your device to one time per year. It allows [name] to keep an eye on the functioning of your device to ensure it is working properly. You are scheduled for a device check from home on 11/28/13. You may send your transmission at any time that day. If you have a wireless device, the transmission will be sent automatically. After your physician reviews your transmission, you will receive a postcard with your next transmission date.  We will check your device monthly because battery is getting low

## 2013-11-07 ENCOUNTER — Other Ambulatory Visit: Payer: Self-pay | Admitting: Internal Medicine

## 2013-11-09 ENCOUNTER — Ambulatory Visit (HOSPITAL_COMMUNITY): Payer: BC Managed Care – PPO

## 2013-11-09 ENCOUNTER — Telehealth (HOSPITAL_COMMUNITY): Payer: Self-pay | Admitting: Adult Health

## 2013-11-09 ENCOUNTER — Ambulatory Visit (HOSPITAL_COMMUNITY)
Admission: RE | Admit: 2013-11-09 | Discharge: 2013-11-09 | Disposition: A | Discharge status: Home / Self Care | Payer: BC Managed Care – PPO | Source: Ambulatory Visit | Attending: Internal Medicine | Admitting: Internal Medicine

## 2013-11-09 VITALS — BP 92/68 | HR 85 | Wt 273.5 lb

## 2013-11-09 DIAGNOSIS — R0609 Other forms of dyspnea: Secondary | ICD-10-CM

## 2013-11-09 DIAGNOSIS — I5022 Chronic systolic (congestive) heart failure: Secondary | ICD-10-CM

## 2013-11-09 DIAGNOSIS — R0683 Snoring: Secondary | ICD-10-CM

## 2013-11-09 DIAGNOSIS — R0989 Other specified symptoms and signs involving the circulatory and respiratory systems: Secondary | ICD-10-CM

## 2013-11-09 LAB — DIGOXIN LEVEL: Digoxin Level: 0.5 ng/mL — ABNORMAL LOW (ref 0.8–2.0)

## 2013-11-09 LAB — PRO B NATRIURETIC PEPTIDE: Pro B Natriuretic peptide (BNP): 622.3 pg/mL — ABNORMAL HIGH (ref 0–125)

## 2013-11-09 LAB — BASIC METABOLIC PANEL
BUN: 11 mg/dL (ref 6–23)
CO2: 24 mEq/L (ref 19–32)
Calcium: 9.4 mg/dL (ref 8.4–10.5)
Chloride: 100 mEq/L (ref 96–112)
Creatinine, Ser: 0.85 mg/dL (ref 0.50–1.10)
GFR calc Af Amer: >90 mL/min (ref >90)
GFR calc non Af Amer: 83 mL/min — ABNORMAL LOW (ref >90)
Glucose, Bld: 94 mg/dL (ref 70–99)
Potassium: 3.9 mEq/L (ref 3.7–5.3)
Sodium: 137 mEq/L (ref 137–147)

## 2013-11-09 MED ORDER — LISINOPRIL 10 MG PO TABS: ORAL_TABLET | ORAL | Status: DC

## 2013-11-09 MED ORDER — CARVEDILOL 12.5 MG PO TABS: 12.5000 mg | ORAL_TABLET | Freq: Two times a day (BID) | ORAL | Status: DC

## 2013-11-09 MED ORDER — DIGOXIN 125 MCG PO TABS: 0.1250 mg | ORAL_TABLET | Freq: Every day | ORAL | Status: DC

## 2013-11-09 NOTE — Telephone Encounter (Signed)
Provided with lab results.   Renal function stable. Dig level 0.5 . Continue digoxin 0.125 mg dialy  Mrs Riecke verbalized understanding.   Lemoine Goyne D Belle Charlie NP-C 3:06 PM

## 2013-11-09 NOTE — Patient Instructions (Signed)
Follow up in 3 weeks  Take lisinopril 10 mg in am and 5 mg in pm  Do the following things EVERYDAY: 1) Weigh yourself in the morning before breakfast. Write it down and keep it in a log. 2) Take your medicines as prescribed 3) Eat low salt foods-Limit salt (sodium) to 2000 mg per day.  4) Stay as active as you can everyday 5) Limit all fluids for the day to less than 2 liters

## 2013-11-09 NOTE — Progress Notes (Signed)
Referring Physician: Dr [name] Primary Cardiologist: Dr [name]  PCP: [facility] in [place]  HPI:  [name] is a 43 y.o. female with morbid obesity, HTN, OSA and systolic HF due to NICM EF 10% 2006 then up to 30-35% and now back down to 10-15% 09/19/13,  S/P ICD Medtronic 2006 .  She is referred by Dr. [name] for HF evaluation.   She has been followed by Dr. Cammie Sickle for systolic HF. She is s/p ICD. Apparently her EF recovered to 35-40%. However was recently admitted to [facility] 09/19/13  for worsening dyspnea and CP. Coronaries were reportedly OK.  EF of 15-20%.2-D echo demonstrated four-chamber enlargement with moderate-severe TR/MR. Cath as below. Rheum w/u negative.   Overall she is ok. Feeling better She leans on the grocery cart walking. SOB with exertion. Occasionally SOB with ADLs. + Orthopnea with HOB elevated. Disabled since 2104. Weight at home 268-273 pounds. Has not had sleep study. Compliant with medications. Follows low salt diet. She does not exercise.   RHC 09/24/13 RA 14 RV 57/22 PA 64/36 (48) PCWP - 18 Fick CO/CI: 3.7/1.6 PVR 8.1 WU Ao sat 95% PA sat 47%  CPX today 11/09/13 FEV1 2.6L (85%) FVC  2.1L (86%) FEV1/FVC 86%  pVO2 14.8 (82%)  Slope 31.7% RER 1.11 Ve/MVV 86%   SH: Disabled since 2014. Lives her husband and 2 children. Does not drink alcohol or smoke FH; Grandmother had heart failure    Review of Systems: [y] = yes, [ ]  = no   General: Weight gain [ Y]; Weight loss [ ] ; Anorexia [ ] ; Fatigue Blue.Reese ]; Fever [ ] ; Chills [ ] ; Weakness [ ]   Cardiac: Chest pain/pressure [ ] ; Resting SOB [ ] ; Exertional SOB [y]; Orthopnea [y]; Pedal Edema Blue.Reese ]; Palpitations [ ] ; Syncope [ ] ; Presyncope [ ] ; Paroxysmal nocturnal dyspnea[ ]   Pulmonary: Cough [ ] ; Wheezing[ ] ; Hemoptysis[ ] ; Sputum [ ] ; Snoring [ ]   GI: Vomiting[ ] ; Dysphagia[ ] ; Melena[ ] ; Hematochezia [ ] ; Heartburn[ ] ; Abdominal pain [ ] ; Constipation [ ] ; Diarrhea [ ] ; BRBPR [ ]   GU: Hematuria[  ]; Dysuria [ ] ; Nocturia[ ]   Vascular: Pain in legs with walking [ ] ; Pain in feet with lying flat [ ] ; Non-healing sores [ ] ; Stroke [ ] ; TIA [ ] ; Slurred speech [ ] ;  Neuro: Headaches[ ] ; Vertigo[ ] ; Seizures[ ] ; Paresthesias[ ] ;Blurred vision [ ] ; Diplopia [ ] ; Vision changes [ ]   Ortho/Skin: Arthritis [ ] ; Joint pain [ ] ; Muscle pain [ ] ; Joint swelling [ ] ; Back Pain [ ] ; Rash [ ]   Psych: Depression[ ] ; Anxiety[ ]   Heme: Bleeding problems [ ] ; Clotting disorders [ ] ; Anemia [ ]   Endocrine: Diabetes [ ] ; Thyroid dysfunction[ Y]   Past Medical History  Diagnosis Date  . Hypertension   . Nonischemic cardiomyopathy   . Sinus tachycardia     Inappropriate therapy for  . Nocturnal dyspnea   . Chronic CHF (congestive heart failure)     class 3  . Morbid obesity   . ICD (implantable cardiac defibrillator) MDT   . Snoring-prob OSA 09/04/2011    Current Outpatient Prescriptions  Medication Sig Dispense Refill  . aspirin 81 MG tablet Take 81 mg by mouth daily.      . carvedilol (COREG) 6.25 MG tablet Take 12.5 mg by mouth 2 (two) times daily with a meal.      . DIGOX 250 MCG tablet Take 0.125 mg by mouth daily.       Marland Kitchen  furosemide (LASIX) 40 MG tablet Take 80 mg by mouth daily.       Marland Kitchen levothyroxine (SYNTHROID, LEVOTHROID) 88 MCG tablet Take 88 mcg by mouth daily before breakfast.      . lisinopril (PRINIVIL,ZESTRIL) 10 MG tablet Take 10 mg by mouth daily.      Marland Kitchen spironolactone (ALDACTONE) 25 MG tablet TAKE ONE TABLET BY MOUTH ONE TIME DAILY   30 tablet  5   No current facility-administered medications for this encounter.    No Known Allergies  History   Social History  . Marital Status: Married    Spouse Name: N/A    Number of Children: N/A  . Years of Education: N/A   Occupational History  . Not on file.   Social History Main Topics  . Smoking status: Never Smoker   . Smokeless tobacco: Not on file  . Alcohol Use: No  . Drug Use: No  . Sexual Activity: Not on file    Other Topics Concern  . Not on file   Social History Narrative  . No narrative on file    Family History: no FHx of familial cardiomyopathy or SCD.   PHYSICAL EXAM: Filed Vitals:   11/09/13 1130  BP: 92/68  Pulse: 85  Weight: 273 lb 8 oz (124.059 kg)  SpO2: 99%    General:  Well appearing. No respiratory difficulty HEENT: normal Neck: supple. JVP ~10 . Carotids 2+ bilat; no bruits. No lymphadenopathy. Goiter Cor: PMI nondisplaced. Regular rate & rhythm. No rubs, gallops or murmurs. Lungs: clear Abdomen: obese. soft, nontender, nondistended. No hepatosplenomegaly. No bruits or masses. Good bowel sounds. Extremities: no cyanosis, clubbing, rash, edema Neuro: alert & oriented x 3, cranial nerves grossly intact. moves all 4 extremities w/o difficulty. Affect pleasant.   ASSESSMENT & PLAN: 1. Chronic systolic HF due to NICM 0/8657 at [facility] ECHO 10-15% RV dysfunction noted.  S/P Medtroninc ICD 2006 . Near ERI per Dr [name] NYHA IIIb. Had CPX today- No circulatory limitations noted. No indication for transplant at this time. She understands that she needs wight loss in the event she may need advanced therapies Volume status stable -continue Lasix 80 mg daily and 25 mg spironolactone daily  Continues carvedilol 12.5 mg twice a day. Continue lisinopril 10 mg daily and 5 mg at night.  Was on 0.25 mg digoxin but this was cut back due to 0.125 mg due to elevated dig level. Check dig level now.  Check BMET, pro BNP and dig level. Reinforced low salt food choices, limiting fluid intake to < 2 liters 2. Pulmonary HTN, mixed. RHC results reviewed from [facility] with PVR 8.  3. Morbid obesity- needs to lose weight. Discussed portion control.  4. OSA never had sleep study . Needs outpatient referral to pulmonary for sleep study.  5. Hypothyroid- Per PCP. Mild goiter on exam. May need thryoid u/s at some point.   Follow up in 2-3 weeks   Miranda D Clegg NP-C  1:09 PM  Patient seen and examined  with Miranda Grinder, NP. We discussed all aspects of the encounter. I agree with the assessment and plan as stated above. I reviewed cath and CPX test with her in detail.  RHC from last month very concerning with significant pulmonary HTN and low cardiac output (CI 1.6). However she is symptomatically improved and CPX today does not show any evidence of a significant cardiac limitation which is very surprising to me. Will continue current HF regimen with an attempt to add an  additional 5mg  of lisinopril at night as tolerated. Will need sleep study for further evaluation of PH. We also discussed need for weight loss and [employer].   We will follow her very closely in HF clinic to titrate meda and we did discuss possibility of advanced therapies if she deteriorates. (Would need VAD as not OHTx candidate due to size).   Shaune Pascal Yailin Biederman,MD 4:37 PM

## 2013-11-26 ENCOUNTER — Telehealth: Payer: Self-pay | Admitting: Physician Assistant

## 2013-11-26 NOTE — Telephone Encounter (Signed)
Mrs. Rosenbach contacted after-hour phone service with concern of low ICD battery. She states her ICD's alarm went off this morning for the 1st time. Her ICD was placed in 2006 for nonischemic cardiomyopathy. Her recent Echo at [facility] showed EF 15-20%. It has never been fired since original placement.   Patient was advised to remotely interrogate the device and send over the carelink system. Dr. Rayann Heman who was the EP attending on call over the weekend was contacted and reviewed the remote transmission. Her alarm was determined to be ERI (elective replacement indicator). Patient was reassured that although her ICD's battery is low, it does not need emergent replacement. Dr. Rayann Heman will ask EP office to call the patient on Monday (11/28/2013) to schedule a device change.   Hilbert Corrigan PA Pager: [phone]

## 2013-11-28 ENCOUNTER — Telehealth: Payer: Self-pay | Admitting: Internal Medicine

## 2013-11-28 ENCOUNTER — Ambulatory Visit (INDEPENDENT_AMBULATORY_CARE_PROVIDER_SITE_OTHER): Payer: Medicare Other | Admitting: *Deleted

## 2013-11-28 DIAGNOSIS — I428 Other cardiomyopathies: Secondary | ICD-10-CM

## 2013-11-28 NOTE — Telephone Encounter (Signed)
New message           Pt wants to know if she needs to send in a transmission today because she sent one in on Saturday

## 2013-11-28 NOTE — Telephone Encounter (Signed)
Transmission received, patient aware. Device @ ERI.  Patient to be scheduled an office visit with Dr. [name].

## 2013-11-28 NOTE — Progress Notes (Addendum)
Remote ICD transmission-N/C (batt only).

## 2013-11-30 ENCOUNTER — Encounter: Payer: Self-pay | Admitting: Internal Medicine

## 2013-11-30 LAB — MDC_IDC_ENUM_SESS_TYPE_REMOTE
Battery Voltage: 2.62 V
Brady Statistic RV Percent Paced: 0 %
Date Time Interrogation Session: 20150606113400
HighPow Impedance: 37 Ohm
Lead Channel Impedance Value: 536 Ohm
Lead Channel Sensing Intrinsic Amplitude: 16.4 mV
Lead Channel Setting Pacing Amplitude: 2.5 V
Lead Channel Setting Pacing Pulse Width: 0.4 ms
Lead Channel Setting Sensing Sensitivity: 0.3 mV
Zone Setting Detection Interval: 250 ms
Zone Setting Detection Interval: 300 ms
Zone Setting Detection Interval: 330 ms

## 2013-11-30 NOTE — Addendum Note (Signed)
Addended by: Shiela Mayer on: 11/30/2013 12:09 PM   Modules accepted: Level of Service

## 2013-12-01 ENCOUNTER — Encounter (HOSPITAL_COMMUNITY): Payer: BC Managed Care – PPO

## 2013-12-06 ENCOUNTER — Encounter: Payer: Self-pay | Admitting: *Deleted

## 2013-12-06 ENCOUNTER — Encounter: Payer: Self-pay | Admitting: Pulmonary Disease

## 2013-12-06 ENCOUNTER — Ambulatory Visit (INDEPENDENT_AMBULATORY_CARE_PROVIDER_SITE_OTHER): Payer: BC Managed Care – PPO | Admitting: Pulmonary Disease

## 2013-12-06 ENCOUNTER — Other Ambulatory Visit: Payer: Self-pay | Admitting: *Deleted

## 2013-12-06 ENCOUNTER — Telehealth: Payer: Self-pay | Admitting: *Deleted

## 2013-12-06 VITALS — BP 88/52 | HR 77 | Temp 98.9°F | Ht 64.0 in | Wt 274.0 lb

## 2013-12-06 DIAGNOSIS — G4733 Obstructive sleep apnea (adult) (pediatric): Secondary | ICD-10-CM

## 2013-12-06 DIAGNOSIS — I5022 Chronic systolic (congestive) heart failure: Secondary | ICD-10-CM

## 2013-12-06 DIAGNOSIS — Z01812 Encounter for preprocedural laboratory examination: Secondary | ICD-10-CM

## 2013-12-06 DIAGNOSIS — Z4502 Encounter for adjustment and management of automatic implantable cardiac defibrillator: Secondary | ICD-10-CM

## 2013-12-06 NOTE — Patient Instructions (Signed)
Will setup for home sleep testing, and arrange followup once the results are available.  Work on weight loss.

## 2013-12-06 NOTE — Progress Notes (Signed)
Subjective:    Patient ID: [name], female    DOB: [birth year], 43 y.o.   MRN: [id number]  HPI Patient is a 43 year old female who I've been asked to see for possible obstructive sleep apnea. She has been noted to have loud snoring as well as gasping arousals. She has frequent awakenings at night, and does not feel rested in the mornings upon arising. She notes some sleep pressure during the day with inactivity, but will often take a nap during the day. She denies any sleepiness issues in the evening watching television, nor does she gets sleepiness with driving. The patient states that her weight is up over 40 pounds in the last 2 years, and she did not want to complete her Epworth scale.   Sleep Questionnaire What time do you typically go to bed?( Between what hours) 11pm-12AM 11pm-12AM at 1436 on 12/06/13 by Inge Rise, CMA How long does it take you to fall asleep? 30-40 min 30-40 min at 1436 on 12/06/13 by Inge Rise, CMA How many times during the night do you wake up? 5 5 at 1436 on 12/06/13 by Inge Rise, [place] What time do you get out of bed to start your day? 0900 0900 at 1436 on 12/06/13 by Inge Rise, CMA Do you drive or operate heavy machinery in your occupation? No No at 1436 on 12/06/13 by Inge Rise, CMA How much has your weight changed (up or down) over the past two years? (In pounds) 40 lb (18.144 kg) 40 lb (18.144 kg) at 1436 on 12/06/13 by Inge Rise, CMA Have you ever had a sleep study before? No No at 1436 on 12/06/13 by Inge Rise, CMA Do you currently use CPAP? No No at 1436 on 12/06/13 by Inge Rise, CMA Do you wear oxygen at any time? No No at 1436 on 12/06/13 by Inge Rise, CMA   Review of Systems  Constitutional: Negative for fever and unexpected weight change.  HENT: Negative for congestion, dental problem, ear pain, nosebleeds, postnasal drip, rhinorrhea, sinus pressure, sneezing, sore throat and trouble swallowing.     Eyes: Negative for redness and itching.  Respiratory: Positive for shortness of breath. Negative for cough, chest tightness and wheezing.   Cardiovascular: Positive for palpitations. Negative for leg swelling.  Gastrointestinal: Negative for nausea and vomiting.  Genitourinary: Negative for dysuria.  Musculoskeletal: Negative for joint swelling.  Skin: Negative for rash.  Neurological: Negative for headaches.  Hematological: Does not bruise/bleed easily.  Psychiatric/Behavioral: Negative for dysphoric mood. The patient is not nervous/anxious.        Objective:   Physical Exam Constitutional:  Morbidly obese female, no acute distress  HENT:  Nares patent without discharge  Oropharynx without exudate, palate and uvula are moderately elongated.  Eyes:  Perrla, eomi, no scleral icterus  Neck:  No JVD, mild thyroid prominence.   Cardiovascular:  Normal rate, regular rhythm, no rubs or gallops.  2/6 sem        Intact distal pulses  Pulmonary :  Normal breath sounds, no stridor or respiratory distress   No rales, rhonchi, or wheezing  Abdominal:  Soft, nondistended, bowel sounds present.  No tenderness noted.   Musculoskeletal:  mild lower extremity edema noted.  Lymph Nodes:  No cervical lymphadenopathy noted  Skin:  No cyanosis noted  Neurologic:  Alert, appropriate, moves all 4 extremities without obvious deficit.         Assessment & Plan:

## 2013-12-06 NOTE — Telephone Encounter (Signed)
lmtcb    (need to schedule ICD gen change and new RV lead)

## 2013-12-06 NOTE — Telephone Encounter (Signed)
Scheduled ICD gen change w/ new rv lead for 12/26/13.  Pre procedure lab work 6/29.  Wound check 7/16. Letter of instructions reviewed with patient and left at front desk for pt pick up. Patient verbalized understanding and agreeable to plan.

## 2013-12-06 NOTE — Progress Notes (Signed)
Patient ID: [name], female   DOB: 12-01-1970, 43 y.o.   MRN: [id number]  Primary Cardiologist: Dr [name]  PCP: [name] in [place]  HPI: [name] is a 43 y.o. 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  She has been followed by Dr. Cammie Clark for systolic HF. She is s/p ICD. Apparently her EF recovered to 35-40%. However was recently admitted to [facility] 09/19/13 for worsening dyspnea and CP. Coronaries were reportedly OK. EF of 15-20%.2-D echo demonstrated four-chamber enlargement with moderate-severe TR/MR. Cath as below. Rheum w/u negative  RHC 09/24/13  RA 14  RV 57/22  PA 64/36 (48)  PCWP - 18  Fick CO/CI: 3.7/1.6  PVR 8.1 WU  Ao sat 95%  PA sat 47%   CPX  11/09/13  FEV1 2.6L (85%)  FVC 2.1L (86%)  FEV1/FVC 86%  pVO2 14.8 (82%)  Slope 31.7%  RER 1.11  Ve/MVV 86%   Follow up for Heart Failure: Last visit referred for sleep study and Clance recommended home sleep study. Scheduled for ICD replacement on 12/26/13. Last visit started lisinopril 5 mg at night which she tolerated. Reports some SOB with the heat. Denies CP. +orthopnea, sleeps in recliner bed. +PND and palpitations. Weight at home 268-270 lbs. Taking medications as prescribed. Following low salt diet and drinking less than 2L a day.   Labs (11/09/13): Dig level 0.5, K 3.6, Creatinine 0.83, pro-BNP 622  SH: Disabled since 2014. Lives her husband and 2 children. Does not drink alcohol or smoke   FH: Grandmother had heart failure   ROS: All systems negative except as listed in HPI, PMH and Problem List.  Past Medical History  Diagnosis Date  . Hypertension   . Nonischemic cardiomyopathy   . Sinus tachycardia     Inappropriate therapy for  . Nocturnal dyspnea   . Chronic CHF (congestive heart failure)     class 3  . Morbid obesity   . ICD (implantable cardiac defibrillator) MDT   . Snoring-prob OSA 09/04/2011    Current Outpatient  Prescriptions  Medication Sig Dispense Refill  . aspirin 81 MG tablet Take 81 mg by mouth daily.      . carvedilol (COREG) 12.5 MG tablet Take 1 tablet (12.5 mg total) by mouth 2 (two) times daily with a meal.  60 tablet  6  . digoxin (LANOXIN) 0.125 MG tablet Take 1 tablet (0.125 mg total) by mouth daily.  30 tablet  3  . furosemide (LASIX) 40 MG tablet Take 2 tablets (80 mg total) by mouth daily.  60 tablet  3  . levothyroxine (SYNTHROID, LEVOTHROID) 88 MCG tablet Take 88 mcg by mouth daily before breakfast.      . lisinopril (PRINIVIL,ZESTRIL) 10 MG tablet Take 10 mg in am and 5 mg in pm  60 tablet  6  . spironolactone (ALDACTONE) 25 MG tablet TAKE ONE TABLET BY MOUTH ONE TIME DAILY   30 tablet  5   No current facility-administered medications for this encounter.    Filed Vitals:   12/07/13 1124  BP: 102/71  Pulse: 78  Resp: 18  Weight: 267 lb 8 oz (121.337 kg)  SpO2: 98%    PHYSICAL EXAM: General: Well appearing. No respiratory difficulty  HEENT: normal  Neck: supple. JVP difficult to assess d/t body habitus but appears about 8 . Carotids 2+ bilat; no bruits. No lymphadenopathy. Goiter  Cor: PMI nondisplaced. Regular rate & rhythm. No  rubs, gallops or murmurs.  Lungs: clear  Abdomen: obese. soft, nontender, nondistended. No hepatosplenomegaly. No bruits or masses. Good bowel sounds.  Extremities: no cyanosis, clubbing, rash, traced edema  Neuro: alert & oriented x 3, cranial nerves grossly intact. moves all 4 extremities w/o difficulty. Affect pleasant.   ASSESSMENT & PLAN:  1. Chronic systolic HF: NICM, EF 05-69% RV dysfunction, mod/severe TR/MR (09/2013 at [facility]) S/P Medtroninc ICD. Near ERI per Dr [name]  - NYHA III/IIIb symptoms and volume status stable. Weight is down 7 lbs since last visit. Will continue lasix 80 mg daily with spironolactone 25 mg daily. Discussed the use of sliding scale diuretics and to take an extra 40 mg of lasix if weight goes up more than 3 lbs in a  day or 5 lbs in a week.  - CPX reviewed last visit and no circulatory limitations noted. Will continue to follow.  - Will increase coreg to 12.5 mg q am and 18.75 mg q pm. Told to call if any dizziness or increased fatigue. - Continue current doses of lisinopril and digoxin. Will check BMET today. Last digoxin 0.5 (10/2013) - Reinforced the need and importance of daily weights, a low sodium diet, and fluid restriction (less than 2 L a day). Instructed to call the HF clinic if weight increases more than 3 lbs overnight or 5 lbs in a week.  2. Pulmonary HTN, mixed. RHC results reviewed from [facility] with PVR 8. Continue to follow 3. Morbid obesity- needs to lose weight. Discussed being as active as possible, watching her portion sizes and cutting back calories. 4. OSA: Has been referred to pulmonary and is awaiting sleep study.   F/U 6 weeks [name] B NP-C 5:09 PM

## 2013-12-06 NOTE — Telephone Encounter (Signed)
Follow Up:  PT is returning [name]'s call.

## 2013-12-06 NOTE — Assessment & Plan Note (Signed)
The patient's history is very suspicious for clinically significant sleep apnea. It had a long discussion with her about the pathophysiology of sleep disordered breathing, including its impact to her quality of life and cardiovascular health. I have recommended a sleep study for diagnosis, and I think she is an excellent candidate for home sleep testing. The patient is agreeable to this approach.

## 2013-12-07 ENCOUNTER — Encounter (HOSPITAL_COMMUNITY): Payer: Self-pay

## 2013-12-07 ENCOUNTER — Telehealth: Payer: Self-pay | Admitting: Internal Medicine

## 2013-12-07 ENCOUNTER — Ambulatory Visit (HOSPITAL_COMMUNITY)
Admission: RE | Admit: 2013-12-07 | Discharge: 2013-12-07 | Disposition: A | Discharge status: Home / Self Care | Payer: BC Managed Care – PPO | Source: Ambulatory Visit | Attending: Internal Medicine | Admitting: Internal Medicine

## 2013-12-07 VITALS — BP 102/71 | HR 78 | Resp 18 | Wt 267.5 lb

## 2013-12-07 DIAGNOSIS — I2789 Other specified pulmonary heart diseases: Secondary | ICD-10-CM | POA: Insufficient documentation

## 2013-12-07 DIAGNOSIS — G4733 Obstructive sleep apnea (adult) (pediatric): Secondary | ICD-10-CM | POA: Insufficient documentation

## 2013-12-07 DIAGNOSIS — I10 Essential (primary) hypertension: Secondary | ICD-10-CM | POA: Insufficient documentation

## 2013-12-07 DIAGNOSIS — E669 Obesity, unspecified: Secondary | ICD-10-CM

## 2013-12-07 DIAGNOSIS — I428 Other cardiomyopathies: Secondary | ICD-10-CM | POA: Insufficient documentation

## 2013-12-07 DIAGNOSIS — I5022 Chronic systolic (congestive) heart failure: Secondary | ICD-10-CM | POA: Insufficient documentation

## 2013-12-07 DIAGNOSIS — I509 Heart failure, unspecified: Secondary | ICD-10-CM | POA: Insufficient documentation

## 2013-12-07 DIAGNOSIS — Z95810 Presence of automatic (implantable) cardiac defibrillator: Secondary | ICD-10-CM | POA: Insufficient documentation

## 2013-12-07 LAB — BASIC METABOLIC PANEL
BUN: 11 mg/dL (ref 6–23)
CO2: 24 mEq/L (ref 19–32)
Calcium: 9.8 mg/dL (ref 8.4–10.5)
Chloride: 99 mEq/L (ref 96–112)
Creatinine, Ser: 0.86 mg/dL (ref 0.50–1.10)
GFR calc Af Amer: >90 mL/min (ref >90)
GFR calc non Af Amer: 82 mL/min — ABNORMAL LOW (ref >90)
Glucose, Bld: 93 mg/dL (ref 70–99)
Potassium: 3.7 mEq/L (ref 3.7–5.3)
Sodium: 138 mEq/L (ref 137–147)

## 2013-12-07 MED ORDER — CARVEDILOL 12.5 MG PO TABS: ORAL_TABLET | ORAL | Status: DC

## 2013-12-07 MED ORDER — FUROSEMIDE 40 MG PO TABS: 80.0000 mg | ORAL_TABLET | Freq: Every day | ORAL | Status: DC

## 2013-12-07 NOTE — Patient Instructions (Signed)
Increase your coreg to 12.5 mg (1 tablet) in the am and 18.75 mg (1 1/2 tablets) in the pm. Call if any increase in fatigue or dizziness.  You can take an extra 40 mg (1 tablet) of lasix if your weight goes up more than 3 lbs in a day or if you miss your pills due to being out and about.  Continue to try and be as active as possible and restrict your calories some.  Call any issues.  Follow up in 6 weeks.   Do the following things EVERYDAY: 1) Weigh yourself in the morning before breakfast. Write it down and keep it in a log. 2) Take your medicines as prescribed 3) Eat low salt foods-Limit salt (sodium) to 2000 mg per day.  4) Stay as active as you can everyday 5) Limit all fluids for the day to less than 2 liters 6)

## 2013-12-07 NOTE — Telephone Encounter (Signed)
Walk In Pt Form "FMLA" Dropped Off For Husband to care For Wife Sent to [employer] 6.18.15/km

## 2013-12-13 ENCOUNTER — Encounter (HOSPITAL_COMMUNITY): Payer: Self-pay | Admitting: Pharmacy Technician

## 2013-12-15 ENCOUNTER — Encounter: Payer: Medicare Other | Admitting: Internal Medicine

## 2013-12-19 ENCOUNTER — Other Ambulatory Visit (INDEPENDENT_AMBULATORY_CARE_PROVIDER_SITE_OTHER): Payer: BC Managed Care – PPO

## 2013-12-19 DIAGNOSIS — Z4502 Encounter for adjustment and management of automatic implantable cardiac defibrillator: Secondary | ICD-10-CM

## 2013-12-19 DIAGNOSIS — I5022 Chronic systolic (congestive) heart failure: Secondary | ICD-10-CM

## 2013-12-19 DIAGNOSIS — Z01812 Encounter for preprocedural laboratory examination: Secondary | ICD-10-CM

## 2013-12-19 LAB — CBC WITH DIFFERENTIAL/PLATELET
Basophils Absolute: 0 10*3/uL (ref 0.0–0.1)
Basophils Relative: 0.3 % (ref 0.0–3.0)
Eosinophils Absolute: 0.2 10*3/uL (ref 0.0–0.7)
Eosinophils Relative: 1.8 % (ref 0.0–5.0)
HCT: 36.9 % (ref 36.0–46.0)
Hemoglobin: 12.1 g/dL (ref 12.0–15.0)
Lymphocytes Relative: 20.6 % (ref 12.0–46.0)
Lymphs Abs: 1.9 10*3/uL (ref 0.7–4.0)
MCHC: 32.8 g/dL (ref 30.0–36.0)
MCV: 84.3 fl (ref 78.0–100.0)
Monocytes Absolute: 0.5 10*3/uL (ref 0.1–1.0)
Monocytes Relative: 6 % (ref 3.0–12.0)
Neutro Abs: 6.5 10*3/uL (ref 1.4–7.7)
Neutrophils Relative %: 71.3 % (ref 43.0–77.0)
Platelets: 295 10*3/uL (ref 150.0–400.0)
RBC: 4.38 Mil/uL (ref 3.87–5.11)
RDW: 15.1 % (ref 11.5–15.5)
WBC: 9.1 10*3/uL (ref 4.0–10.5)

## 2013-12-19 LAB — BASIC METABOLIC PANEL
BUN: 10 mg/dL (ref 6–23)
CO2: 29 mEq/L (ref 19–32)
Calcium: 9.3 mg/dL (ref 8.4–10.5)
Chloride: 105 mEq/L (ref 96–112)
Creatinine, Ser: 0.8 mg/dL (ref 0.4–1.2)
GFR: 97.92 mL/min (ref >60.00)
Glucose, Bld: 96 mg/dL (ref 70–99)
Potassium: 3.6 mEq/L (ref 3.5–5.1)
Sodium: 138 mEq/L (ref 135–145)

## 2013-12-25 MED ORDER — SODIUM CHLORIDE 0.9 % IR SOLN
80.0000 mg | Status: DC
  Filled 2013-12-25: qty 2

## 2013-12-25 MED ORDER — DEXTROSE 5 % IV SOLN
3.0000 g | INTRAVENOUS | Status: DC
  Filled 2013-12-25: qty 3000

## 2013-12-26 ENCOUNTER — Ambulatory Visit (HOSPITAL_COMMUNITY)
Admission: RE | Admit: 2013-12-26 | Discharge: 2013-12-27 | Disposition: A | Discharge status: Home / Self Care | Payer: BC Managed Care – PPO | Source: Ambulatory Visit | Attending: Internal Medicine | Admitting: Internal Medicine

## 2013-12-26 ENCOUNTER — Encounter (HOSPITAL_COMMUNITY)
Admission: RE | Disposition: A | Discharge status: Home / Self Care | Payer: Self-pay | Source: Ambulatory Visit | Attending: Internal Medicine

## 2013-12-26 ENCOUNTER — Encounter (HOSPITAL_COMMUNITY): Payer: Self-pay | Admitting: Internal Medicine

## 2013-12-26 DIAGNOSIS — I509 Heart failure, unspecified: Secondary | ICD-10-CM | POA: Insufficient documentation

## 2013-12-26 DIAGNOSIS — I10 Essential (primary) hypertension: Secondary | ICD-10-CM | POA: Insufficient documentation

## 2013-12-26 DIAGNOSIS — R0609 Other forms of dyspnea: Secondary | ICD-10-CM | POA: Insufficient documentation

## 2013-12-26 DIAGNOSIS — I428 Other cardiomyopathies: Secondary | ICD-10-CM | POA: Diagnosis present

## 2013-12-26 DIAGNOSIS — R0989 Other specified symptoms and signs involving the circulatory and respiratory systems: Secondary | ICD-10-CM | POA: Insufficient documentation

## 2013-12-26 DIAGNOSIS — T82198A Other mechanical complication of other cardiac electronic device, initial encounter: Secondary | ICD-10-CM | POA: Diagnosis present

## 2013-12-26 DIAGNOSIS — Z4502 Encounter for adjustment and management of automatic implantable cardiac defibrillator: Secondary | ICD-10-CM

## 2013-12-26 DIAGNOSIS — I5022 Chronic systolic (congestive) heart failure: Secondary | ICD-10-CM | POA: Diagnosis present

## 2013-12-26 HISTORY — PX: LEAD REVISION: SHX5945

## 2013-12-26 HISTORY — DX: Hypothyroidism, unspecified: E03.9

## 2013-12-26 HISTORY — DX: Presence of automatic (implantable) cardiac defibrillator: Z95.810

## 2013-12-26 HISTORY — DX: Acute myocardial infarction, unspecified: I21.9

## 2013-12-26 HISTORY — DX: Personal history of other diseases of the digestive system: Z87.19

## 2013-12-26 HISTORY — DX: Personal history of peptic ulcer disease: Z87.11

## 2013-12-26 HISTORY — PX: IMPLANTABLE CARDIOVERTER DEFIBRILLATOR GENERATOR CHANGE: SHX5474

## 2013-12-26 HISTORY — DX: Other mechanical complication of other cardiac electronic device, initial encounter: T82.198A

## 2013-12-26 LAB — SURGICAL PCR SCREEN
MRSA, PCR: INVALID — AB
Staphylococcus aureus: INVALID — AB

## 2013-12-26 SURGERY — IMPLANTABLE CARDIOVERTER DEFIBRILLATOR GENERATOR CHANGE
Anesthesia: LOCAL

## 2013-12-26 MED ORDER — SODIUM CHLORIDE 0.9 % IV SOLN: INTRAVENOUS | Status: AC

## 2013-12-26 MED ORDER — SPIRONOLACTONE 25 MG PO TABS
25.0000 mg | ORAL_TABLET | Freq: Every day | ORAL | Status: DC
  Filled 2013-12-26 (×2): qty 1

## 2013-12-26 MED ORDER — ASPIRIN 81 MG PO CHEW
81.0000 mg | CHEWABLE_TABLET | Freq: Every day | ORAL | Status: DC
  Administered 2013-12-26: 81 mg via ORAL
  Filled 2013-12-26: qty 1

## 2013-12-26 MED ORDER — MUPIROCIN 2 % EX OINT
TOPICAL_OINTMENT | CUTANEOUS | Status: AC
  Filled 2013-12-26: qty 22

## 2013-12-26 MED ORDER — LISINOPRIL 5 MG PO TABS
5.0000 mg | ORAL_TABLET | Freq: Every day | ORAL | Status: DC
  Filled 2013-12-26 (×2): qty 1

## 2013-12-26 MED ORDER — CARVEDILOL 12.5 MG PO TABS: 12.5000 mg | ORAL_TABLET | Freq: Two times a day (BID) | ORAL | Status: DC

## 2013-12-26 MED ORDER — SODIUM CHLORIDE 0.9 % IV SOLN
INTRAVENOUS | Status: DC
  Administered 2013-12-26: 07:00:00 via INTRAVENOUS

## 2013-12-26 MED ORDER — CEFAZOLIN SODIUM 1-5 GM-% IV SOLN
1.0000 g | Freq: Four times a day (QID) | INTRAVENOUS | Status: AC
  Administered 2013-12-26 – 2013-12-27 (×3): 1 g via INTRAVENOUS
  Filled 2013-12-26 (×3): qty 50

## 2013-12-26 MED ORDER — LISINOPRIL 10 MG PO TABS
10.0000 mg | ORAL_TABLET | Freq: Every day | ORAL | Status: DC
  Filled 2013-12-26 (×2): qty 1

## 2013-12-26 MED ORDER — CARVEDILOL 12.5 MG PO TABS
12.5000 mg | ORAL_TABLET | Freq: Every day | ORAL | Status: DC
  Filled 2013-12-26 (×2): qty 1

## 2013-12-26 MED ORDER — ONDANSETRON HCL 4 MG/2ML IJ SOLN: 4.0000 mg | Freq: Four times a day (QID) | INTRAMUSCULAR | Status: DC | PRN

## 2013-12-26 MED ORDER — MUPIROCIN 2 % EX OINT
TOPICAL_OINTMENT | Freq: Two times a day (BID) | CUTANEOUS | Status: DC
  Administered 2013-12-26: 07:00:00 via NASAL
  Filled 2013-12-26: qty 22

## 2013-12-26 MED ORDER — CARVEDILOL 6.25 MG PO TABS
18.7500 mg | ORAL_TABLET | Freq: Every day | ORAL | Status: DC
  Administered 2013-12-26: 18.75 mg via ORAL
  Filled 2013-12-26 (×2): qty 1

## 2013-12-26 MED ORDER — ACETAMINOPHEN 325 MG PO TABS
325.0000 mg | ORAL_TABLET | ORAL | Status: DC | PRN
  Administered 2013-12-26 (×2): 650 mg via ORAL
  Filled 2013-12-26 (×2): qty 2

## 2013-12-26 MED ORDER — FENTANYL CITRATE 0.05 MG/ML IJ SOLN
INTRAMUSCULAR | Status: AC
  Filled 2013-12-26: qty 2

## 2013-12-26 MED ORDER — DIGOXIN 125 MCG PO TABS
0.1250 mg | ORAL_TABLET | Freq: Every day | ORAL | Status: DC
  Administered 2013-12-26: 0.125 mg via ORAL
  Filled 2013-12-26 (×2): qty 1

## 2013-12-26 MED ORDER — HEPARIN (PORCINE) IN NACL 2-0.9 UNIT/ML-% IJ SOLN
INTRAMUSCULAR | Status: AC
  Filled 2013-12-26: qty 500

## 2013-12-26 MED ORDER — CEFAZOLIN SODIUM 1-5 GM-% IV SOLN
1.0000 g | Freq: Four times a day (QID) | INTRAVENOUS | Status: DC
  Filled 2013-12-26 (×2): qty 50

## 2013-12-26 MED ORDER — ASPIRIN 81 MG PO TABS: 81.0000 mg | ORAL_TABLET | Freq: Every day | ORAL | Status: DC

## 2013-12-26 MED ORDER — FUROSEMIDE 80 MG PO TABS
80.0000 mg | ORAL_TABLET | Freq: Every day | ORAL | Status: DC
  Filled 2013-12-26 (×2): qty 1

## 2013-12-26 MED ORDER — ACETAMINOPHEN 325 MG PO TABS: 650.0000 mg | ORAL_TABLET | Freq: Every day | ORAL | Status: DC | PRN

## 2013-12-26 MED ORDER — CARVEDILOL 6.25 MG PO TABS
18.7500 mg | ORAL_TABLET | Freq: Every day | ORAL | Status: DC
  Filled 2013-12-26: qty 1

## 2013-12-26 MED ORDER — LEVOTHYROXINE SODIUM 88 MCG PO TABS
88.0000 ug | ORAL_TABLET | Freq: Every day | ORAL | Status: DC
  Filled 2013-12-26 (×2): qty 1

## 2013-12-26 MED ORDER — MIDAZOLAM HCL 5 MG/5ML IJ SOLN
INTRAMUSCULAR | Status: AC
  Filled 2013-12-26: qty 5

## 2013-12-26 MED ORDER — LIDOCAINE HCL (PF) 1 % IJ SOLN
INTRAMUSCULAR | Status: AC
  Filled 2013-12-26: qty 30

## 2013-12-26 MED ORDER — CHLORHEXIDINE GLUCONATE 4 % EX LIQD
60.0000 mL | Freq: Once | CUTANEOUS | Status: DC
  Filled 2013-12-26: qty 60

## 2013-12-26 MED ORDER — LISINOPRIL 5 MG PO TABS: 5.0000 mg | ORAL_TABLET | Freq: Two times a day (BID) | ORAL | Status: DC

## 2013-12-26 NOTE — CV Procedure (Signed)
Preoperative diagnosis  NICM  CHF prev ICD @ ERI Postoperative diagnosis same/   Procedure: Generator replacement  Venogram and RV lead insertion  Following informed consent the patient was brought to the electrophysiology laboratory in place of the fluoroscopic table in the supine position;  Contrast venogram demonstrated patency of the extra thoracic L [place] vein . after routine prep and drape lidocaine was infiltrated in the region of the previous incision and carried down to layer of the device pocket using sharp dissection and electrocautery. The pocket was opened the device was freed up and was explanted.  The three lead heads were capped  We then undertook gaining access to the L  vwein which was accomplished without difficulty and a 26F sheath was place through which was inserted Medtronic 6935 lead SN [id number]  Under flouroscopic guidance the lead was moved to the RV apex where care was made to insure no contact of the bipole with the previous lead.  In this location the bipolar R wave was 9.3 with impedance 1080 and threshold 0.8 @ 0.5 msec  Current of injury was brisk    . The leads were then attached to a Medtronic  pulse generator, serial number [id number].    Through the device the ICD ventricular lead demonstrated an R wave of  10.2  millivolts., and impedance of 807 ohms, and a pacing threshold of  0.5 volts at 0.4 msec     High voltage impedances were 70 ohms  The pocket was irrigated with antibiotic containing saline solution hemostasis was assured and the leads and the device were placed in the pocket. The wound was then closed in 3 layers in normal fashion. mEPITEL SURGICAL MASH WAS APPLIED TO THE STERI STRIP DRESSING  The patient tolerated the procedure without apparent complication.  DFT testing was not performed  Virl Axe   \

## 2013-12-26 NOTE — H&P (Signed)
History and Physical  Patient ID: [name] MRN: [id number], SOB: [birth year] 43 y.o. Date of Encounter: 12/26/2013, 8:05 AM  Primary Physician: [name], MD Primary Cardiologist: [facility] Electrophysiologist:  SK  Chief Complaint:  ICD generator replacement and recall ICD lead  History of Present Illness: [name] is a 43 y.o. female     With NICM and recurrent CHF  Most recent hospitalization spring 2015 with EF 15-20%  Narrow QRS   [facility] now by [facility]  She is feeling much improved  No edema  CPX  >>no major circulatory limitiations  She is morbidly obese and has prob OSA, sleep study pending  Past Medical History  Diagnosis Date  . Hypertension   . Nonischemic cardiomyopathy   . Sinus tachycardia   . Nocturnal dyspnea   . Chronic CHF (congestive heart failure)     a. EF 15-20%  . Morbid obesity   . ICD (implantable cardiac defibrillator) MDT   . Snoring-prob OSA 09/04/2011     Past Surgical History  Procedure Laterality Date  . Cardiac pacemaker placement      Medtronic [id number] cardioverter defibrillator  . Tubal ligation        Current Facility-Administered Medications  Medication Dose Route Frequency Provider Last Rate Last Dose  . 0.9 %  sodium chloride infusion   Intravenous Continuous Deboraha Sprang, MD 50 mL/hr at 12/26/13 0645    . ceFAZolin (ANCEF) 3 g in dextrose 5 % 50 mL IVPB  3 g Intravenous On Call Deboraha Sprang, MD      . chlorhexidine (HIBICLENS) 4 % liquid 4 application  60 mL Topical Once Deboraha Sprang, MD      . gentamicin (GARAMYCIN) 80 mg in sodium chloride irrigation 0.9 % 500 mL irrigation  80 mg Irrigation On Call Deboraha Sprang, MD      . mupirocin ointment Drue Stager) 2 %   Nasal BID Deboraha Sprang, MD         Allergies: No Known Allergies   History  Substance Use Topics  . Smoking status: Never Smoker   . Smokeless tobacco: Not on file  . Alcohol Use: No      Family History    Problem Relation Age of Onset  . Heart disease Maternal Grandmother       ROS:  Please see the history of present illness.     All other systems reviewed and negative.   Vital Signs: Blood pressure 105/71, pulse 78, temperature 98 F (36.7 C), temperature source Oral, resp. rate 18, height 5\' 4"  (1.626 m), weight 268 lb (121.564 kg), last menstrual period 11/26/2013, SpO2 99.00%.  PHYSICAL EXAM: General:  Well nourished, well developed female in no acute distress HEENT: normal Lymph: no adenopathy Neck: no JVD Vascular: No carotid bruits; FA pulses 2+ bilaterally without bruits Cardiac:  normal S1, S2; RRR; no murmur Back: without kyphosis/scoliosis, no CVA tenderness Lungs:  clear to auscultation bilaterally, no wheezing, rhonchi or rales Abd: soft, nontender, no hepatomegaly Ext: no edema Musculoskeletal:  No deformities, BUE and BLE strength normal and equal Skin: warm and dry Neuro:  CNs 2-12 intact, no focal abnormalities noted Psych:  Normal affect   EKG: zsinus   Narrow QRS  Labs:   Lab Results  Component Value Date   WBC 9.1 12/19/2013   HGB 12.1 12/19/2013   HCT 36.9 12/19/2013   MCV 84.3 12/19/2013   PLT 295.0 12/19/2013     Recent  Labs Lab 12/19/13 1158  NA 138  K 3.6  CL 105  CO2 29  BUN 10  CREATININE 0.8  CALCIUM 9.3  GLUCOSE 96   No results found for this basename: CKTOTAL, CKMB, TROPONINI,  in the last 72 hours No results found for this basename: CHOL,  HDL,  LDLCALC,  TRIG   No results found for this basename: DDIMER   BNP Pro B Natriuretic peptide (BNP)  Date/Time Value Ref Range Status  11/09/2013  1:18 PM 622.3* 0 - 125 pg/mL Final  02/29/2008 11:32 AM 23.0  0.0-100.0 pg/mL Final       ASSESSMENT AND PLAN:   Active Problems:   CARDIOMYOPATHY, PRIMARY, DILATED   SYSTOLIC HEART FAILURE, CHRONIC   Implantable cardiac defibrillator- Medtronic   [employer] ICD lead RECALL  [phone]  For Device generator replacment and revision of  Recall lead  We have reviewed the benefits and risks of generator replacement.  These include but are not limited to lead fracture and infection.  The patient understands, agrees and is willing to proceed.

## 2013-12-26 NOTE — Discharge Summary (Signed)
ELECTROPHYSIOLOGY PROCEDURE DISCHARGE SUMMARY    Patient ID: [name],  MRN: [id number], DOB/AGE: 28-Aug-1970 43 y.o.  Admit date: 12/26/2013 Discharge date: 12/27/2013  Primary Care Physician: [name], MD Primary Cardiologist: [place] Electrophysiologist: [name]  Primary Discharge Diagnosis:  Non ischemic cardiomyopathy with previously placed ICD at [facility] and RV lead on manufacturer recall s/p ICD gen change and RV lead revision this admission  Secondary Discharge Diagnosis:  1. Hypertension 2.  Chronic systolic heart failure 3.  Obesity 4.  Previous inappropriate ICD therapy for sinus tachycardia  No Known Allergies   Procedures This Admission:  1.  Implantation of a MDT single chamber ICD on 12-26-2013 by Dr [name].  The patient received a MDT  ICD with model number 5465 right ventricular lead.  The patient's previously implanted 6949 lead was capped  DFT's were deferred at time of implant.  There were no immediate post procedure complications. 2.  CXR on 12-27-13 demonstrated no pneumothorax status post device implantation.   Brief HPI: [name] is a 43 y.o. female with a past medical history as outlined above.  She underwent ICD implantation in October of 2006 for primary prevention.  Her device has reached ERI and her RV lead is on manufacturer recall.  Risks, benefits, and alternatives to ICD generator change and RV lead revision were reviewed with the patient who wished to proceed.   Hospital Course:  The patient was admitted and underwent implantation of a MDT single chamber ICD with details as outlined above.   She was monitored on telemetry overnight which demonstrated sinus rhythm with PVC's.  Left chest was without hematoma or ecchymosis.  The device was interrogated and found to be functioning normally.  CXR was obtained and demonstrated no pneumothorax status post device implantation.  Wound care, arm mobility, and restrictions were reviewed with the  patient.  Dr Rayann Heman  examined the patient and considered them stable for discharge to home.   The patient's discharge medications include an ACE-I (Lisinopril) and beta blocker (Coreg).   Discharge Vitals: Blood pressure 98/63, pulse 74, temperature 97.7 F (36.5 C), temperature source Oral, resp. rate 18, height 5\' 4"  (1.626 m), weight 267 lb 11 oz (121.423 kg), last menstrual period 11/26/2013, SpO2 98.00%. Physical Exam:   GEN- The patient is well appearing, alert and oriented x 3 today.   Head- normocephalic, atraumatic Eyes-  Sclera clear, conjunctiva pink Ears- hearing intact Oropharynx- clear Neck- supple, Lungs- Clear to ausculation bilaterally, normal work of breathing Heart- Regular rate and rhythm, no murmurs, rubs or gallops. Extremities- no clubbing, cyanosis, or edema, groin is without hematoma/ bruit MS- no significant deformity or atrophy Skin- no rash or lesion. L chest without hematoma or ecchymosis. Psych- euthymic mood, full affect Neuro- strength and sensation are intact Labs:   Lab Results  Component Value Date   WBC 9.1 12/19/2013   HGB 12.1 12/19/2013   HCT 36.9 12/19/2013   MCV 84.3 12/19/2013   PLT 295.0 12/19/2013   No results found for this basename: NA, K, CL, CO2, BUN, CREATININE, CALCIUM, LABALBU, PROT, BILITOT, ALKPHOS, ALT, AST, GLUCOSE,  in the last 168 hours   Discharge Medications:    Medication List         acetaminophen 325 MG tablet  Commonly known as:  TYLENOL  Take 650 mg by mouth daily as needed for headache.     aspirin 81 MG tablet  Take 81 mg by mouth daily.     carvedilol 12.5  MG tablet  Commonly known as:  COREG  Take 12.5-18.75 mg by mouth 2 (two) times daily with a meal. Pt takes 12.5mg  in a.m. and 18.75 mg in p.m.     digoxin 0.125 MG tablet  Commonly known as:  LANOXIN  Take 0.125 mg by mouth daily.     furosemide 40 MG tablet  Commonly known as:  LASIX  Take 80 mg by mouth daily.     levothyroxine 88 MCG tablet    Commonly known as:  SYNTHROID, LEVOTHROID  Take 88 mcg by mouth daily before breakfast.     lisinopril 10 MG tablet  Commonly known as:  PRINIVIL,ZESTRIL  Take 5-10 mg by mouth 2 (two) times daily. Take 10 mg in am and 5 mg in pm     spironolactone 25 MG tablet  Commonly known as:  ALDACTONE  Take 25 mg by mouth daily.        Disposition:   Follow-up Information   Follow up with [facility] On 01/05/2014. (11:30 am)       Duration of Discharge Encounter: Greater than 30 minutes including physician time.  Signed,  Thompson Grayer MD

## 2013-12-26 NOTE — Progress Notes (Signed)
UR Completed Tauheed Mcfayden Graves-Bigelow, RN,BSN [phone]  

## 2013-12-26 NOTE — Interval H&P Note (Signed)
ICD Criteria  Current LVEF:20% ;Obtained > or = 1 month ago and < or = 3 months ago.  NYHA Functional Classification: Class III  Heart Failure History:  Yes, Duration of heart failure since onset is > 9 months  Non-Ischemic Dilated Cardiomyopathy History:  Yes, timeframe is > 9 months  Atrial Fibrillation/Atrial Flutter:  No.  Ventricular Tachycardia History:  No.  Cardiac Arrest History:  No  History of Syndromes with Risk of Sudden Death:  No.  Previous ICD:  Yes, ICD Type:  Single, Reason for ICD:  Primary prevention.  LVEF is not available  Electrophysiology Study: No.  Prior MI: No.  PPM: No.  OSA:  Yes  Patient Life Expectancy of >=1 year: Yes.  Anticoagulation Therapy:  Patient is NOT on anticoagulation therapy.   Beta Blocker Therapy:  Yes.   Ace Inhibitor/ARB Therapy:  Yes.History and Physical Interval Note:  12/26/2013 8:08 AM  [name]  has presented today for surgery, with the diagnosis of battery depletion, new lead, [phone] replacement  The various methods of treatment have been discussed with the patient and family. After consideration of risks, benefits and other options for treatment, the patient has consented to  Procedure(s): IMPLANTABLE CARDIOVERTER DEFIBRILLATOR GENERATOR CHANGE (N/A) LEAD REVISION (N/A) as a surgical intervention .  The patient's history has been reviewed, patient examined, no change in status, stable for surgery.  I have reviewed the patient's chart and labs.  Questions were answered to the patient's satisfaction.     Virl Axe

## 2013-12-26 NOTE — Progress Notes (Signed)
Orthopedic Tech Progress Note Patient Details:  [name] 06-Aug-1970 [id number]  Ortho Devices Type of Ortho Device: Arm sling Ortho Device/Splint Interventions: Application   Cammer, Theodoro Parma 12/26/2013, 12:14 PM

## 2013-12-26 NOTE — Progress Notes (Signed)
Patient's bp= 93/47.  I spoke to Chanetta Marshall, RN (dr. Olin Pia RN)  She stated to hold po lasix, lisinopril, and aldactone.

## 2013-12-27 ENCOUNTER — Ambulatory Visit (HOSPITAL_COMMUNITY): Payer: BC Managed Care – PPO

## 2013-12-27 ENCOUNTER — Encounter (HOSPITAL_COMMUNITY): Payer: Self-pay | Admitting: *Deleted

## 2013-12-27 ENCOUNTER — Encounter: Payer: Self-pay | Admitting: Cardiology

## 2013-12-27 DIAGNOSIS — I5022 Chronic systolic (congestive) heart failure: Secondary | ICD-10-CM

## 2013-12-27 NOTE — Discharge Instructions (Signed)
Supplemental Discharge Instructions for  Pacemaker/Defibrillator Patients  Activity No heavy lifting or vigorous activity with your left/right arm for 6 to 8 weeks.  Do not raise your left/right arm above your head for one week.  Gradually raise your affected arm as drawn below.          7-8                              7-9                           7-10                      7-11 __  NO DRIVING for 1 week    ; you may begin driving on 9-92     .  WOUND CARE   Keep the wound area clean and dry.  Do not get this area wet for one week. No showers for one week; you may shower on   7-13  .   The tape/steri-strips on your wound will fall off; do not pull them off.  No bandage is needed on the site.  DO  NOT apply any creams, oils, or ointments to the wound area.   If you notice any drainage or discharge from the wound, any swelling or bruising at the site, or you develop a fever > 101? F after you are discharged home, call the office at once.  Special Instructions   You are still able to use cellular telephones; use the ear opposite the side where you have your pacemaker/defibrillator.  Avoid carrying your cellular phone near your device.   When traveling through airports, show security personnel your identification card to avoid being screened in the metal detectors.  Ask the security personnel to use the hand wand.   Avoid arc welding equipment, MRI testing (magnetic resonance imaging), TENS units (transcutaneous nerve stimulators).  Call the office for questions about other devices.   Avoid electrical appliances that are in poor condition or are not properly grounded.   Microwave ovens are safe to be near or to operate.  Additional information for defibrillator patients should your device go off:   If your device goes off ONCE and you feel fine afterward, notify the device clinic nurses.   If your device goes off ONCE and you do not feel well afterward, call 911.   If your device goes  off TWICE, call 911.   If your device goes off THREE times in one day, call 911.  DO NOT DRIVE YOURSELF OR A FAMILY MEMBER WITH A DEFIBRILLATOR TO THE HOSPITAL--CALL 911.  Low-Sodium Eating Plan Sodium raises blood pressure and causes water to be held in the body. Getting less sodium from food will help lower your blood pressure, reduce any swelling, and protect your heart, liver, and kidneys. We get sodium by adding salt (sodium chloride) to food. Most of our sodium comes from canned, boxed, and frozen foods. Restaurant foods, fast foods, and pizza are also very high in sodium. Even if you take medicine to lower your blood pressure or to reduce fluid in your body, getting less sodium from your food is important. WHAT IS MY PLAN? Most people should limit their sodium intake to 2,300 mg a day. Your health care provider recommends that you limit your sodium intake to __________ a day.  WHAT  DO I NEED TO KNOW ABOUT THIS EATING PLAN? For the low-sodium eating plan, you will follow these general guidelines:  Choose foods with a % Daily Value for sodium of less than 5% (as listed on the food label).   Use salt-free seasonings or herbs instead of table salt or sea salt.   Check with your health care provider or pharmacist before using salt substitutes.   Eat fresh foods.  Eat more vegetables and fruits.  Limit canned vegetables. If you do use them, rinse them well to decrease the sodium.   Limit cheese to 1 oz (28 g) per day.   Eat lower-sodium products, often labeled as "lower sodium" or "no salt added."  Avoid foods that contain monosodium glutamate (MSG). MSG is sometimes added to [place] food and some canned foods.  Check food labels (Nutrition Facts labels) on foods to learn how much sodium is in one serving.  Eat more home-cooked food and less restaurant, buffet, and fast food.  When eating at a restaurant, ask that your food be prepared with less salt or none, if possible.   HOW DO I READ FOOD LABELS FOR SODIUM INFORMATION? The Nutrition Facts label lists the amount of sodium in one serving of the food. If you eat more than one serving, you must multiply the listed amount of sodium by the number of servings. Food labels may also identify foods as:  Sodium free--Less than 5 mg in a serving.  Very low sodium--35 mg or less in a serving.  Low sodium--140 mg or less in a serving.  Light in sodium--50% less sodium in a serving. For example, if a food that usually has 300 mg of sodium is changed to become light in sodium, it will have 150 mg of sodium.  Reduced sodium--25% less sodium in a serving. For example, if a food that usually has 400 mg of sodium is changed to reduced sodium, it will have 300 mg of sodium. WHAT FOODS CAN I EAT? Grains Low-sodium cereals, including oats, puffed wheat and rice, and shredded wheat cereals. Low-sodium crackers. Unsalted rice and pasta. Lower-sodium bread.  Vegetables Frozen or fresh vegetables. Low-sodium or reduced-sodium canned vegetables. Low-sodium or reduced-sodium tomato sauce and paste. Low-sodium or reduced-sodium tomato and vegetable juices.  Fruits Fresh, frozen, and canned fruit. Fruit juice.  Meat and Other Protein Products Low-sodium canned tuna and salmon. Fresh or frozen meat, poultry, seafood, and fish. Lamb. Unsalted nuts. Dried beans, peas, and lentils without added salt. Unsalted canned beans. Homemade soups without salt. Eggs.  Dairy Milk. Soy milk. Ricotta cheese. Low-sodium or reduced-sodium cheeses. Yogurt.  Condiments Fresh and dried herbs and spices. Salt-free seasonings. Onion and garlic powders. Low-sodium varieties of mustard and ketchup. Lemon juice.  Fats and Oils Reduced-sodium salad dressings. Unsalted butter.  Other Unsalted popcorn and pretzels.  The items listed above may not be a complete list of recommended foods or beverages. Contact your dietitian for more options. WHAT  FOODS ARE NOT RECOMMENDED? Grains Instant hot cereals. Bread stuffing, pancake, and biscuit mixes. Croutons. Seasoned rice or pasta mixes. Noodle soup cups. Boxed or frozen macaroni and cheese. Self-rising flour. Regular salted crackers. Vegetables Regular canned vegetables. Regular canned tomato sauce and paste. Regular tomato and vegetable juices. Frozen vegetables in sauces. Salted french fries. Olives. Angie Fava. Relishes. Sauerkraut. Salsa. Meat and Other Protein Products Salted, canned, smoked, spiced, or pickled meats, seafood, or fish. Bacon, ham, sausage, hot dogs, corned beef, chipped beef, and packaged luncheon meats. Salt pork. Jerky. Pickled  herring. Anchovies, regular canned tuna, and sardines. Salted nuts. Dairy Processed cheese and cheese spreads. Cheese curds. Blue cheese and cottage cheese. Buttermilk.  Condiments Onion and garlic salt, seasoned salt, table salt, and sea salt. Canned and packaged gravies. Worcestershire sauce. Tartar sauce. Barbecue sauce. Teriyaki sauce. Soy sauce, including reduced sodium. Steak sauce. Fish sauce. Oyster sauce. Cocktail sauce. Horseradish. Regular ketchup and mustard. Meat flavorings and tenderizers. Bouillon cubes. Hot sauce. Tabasco sauce. Marinades. Taco seasonings. Relishes. Fats and Oils Regular salad dressings. Salted butter. Margarine. Ghee. Bacon fat.  Other Potato and tortilla chips. Corn chips and puffs. Salted popcorn and pretzels. Canned or dried soups. Pizza. Frozen entrees and pot pies.  The items listed above may not be a complete list of foods and beverages to avoid. Contact your dietitian for more information. Document Released: 11/29/2001 Document Revised: 06/14/2013 Document Reviewed: 04/13/2013 [employer] Patient Information 2015 [place], [place]. This information is not intended to replace advice given to you by your health care provider. Make sure you discuss any questions you have with your health care  provider.   Cardiac Diet This diet can help prevent heart disease and stroke. Many factors influence your heart health, including eating and exercise habits. Coronary risk rises a lot with abnormal blood fat (lipid) levels. Cardiac meal planning includes limiting unhealthy fats, increasing healthy fats, and making other small dietary changes. General guidelines are as follows:  Adjust calorie intake to reach and maintain desirable body weight.  Limit total fat intake to less than 30% of total calories. Saturated fat should be less than 7% of calories.  Saturated fats are found in animal products and in some vegetable products. Saturated vegetable fats are found in coconut oil, cocoa butter, palm oil, and palm kernel oil. Read labels carefully to avoid these products as much as possible. Use butter in moderation. Choose tub margarines and oils that have 2 grams of fat or less. Good cooking oils are canola and olive oils.  Practice low-fat cooking techniques. Do not fry food. Instead, broil, bake, boil, steam, grill, roast on a rack, stir-fry, or microwave it. Other fat reducing suggestions include:  Remove the skin from poultry.  Remove all visible fat from meats.  Skim the fat off stews, soups, and gravies before serving them.  Steam vegetables in water or broth instead of sauting them in fat.  Avoid foods with trans fat (or hydrogenated oils), such as commercially fried foods and commercially baked goods. Commercial shortening and deep-frying fats will contain trans fat.  Increase intake of fruits, vegetables, whole grains, and legumes to replace foods high in fat.  Increase consumption of nuts, legumes, and seeds to at least 4 servings weekly. One serving of a legume equals  cup, and 1 serving of nuts or seeds equals  cup.  Choose whole grains more often. Have 3 servings per day (a serving is 1 ounce [oz]).  Eat 4 to 5 servings of vegetables per day. A serving of vegetables is 1  cup of raw leafy vegetables;  cup of raw or cooked cut-up vegetables;  cup of vegetable juice.  Eat 4 to 5 servings of fruit per day. A serving of fruit is 1 medium whole fruit;  cup of dried fruit;  cup of fresh, frozen, or canned fruit;  cup of 100% fruit juice.  Increase your intake of dietary fiber to 20 to 30 grams per day. Insoluble fiber may help lower your risk of heart disease and may help curb your appetite.  Soluble  fiber binds cholesterol to be removed from the blood. Foods high in soluble fiber are dried beans, citrus fruits, oats, apples, bananas, broccoli, Brussels sprouts, and eggplant.  Try to include foods fortified with plant sterols or stanols, such as yogurt, breads, juices, or margarines. Choose several fortified foods to achieve a daily intake of 2 to 3 grams of plant sterols or stanols.  Foods with omega-3 fats can help reduce your risk of heart disease. Aim to have a 3.5 oz portion of fatty fish twice per week, such as salmon, mackerel, albacore tuna, sardines, lake trout, or herring. If you wish to take a fish oil supplement, choose one that contains 1 gram of both DHA and EPA.  Limit processed meats to 2 servings (3 oz portion) weekly.  Limit the sodium in your diet to 1500 milligrams (mg) per day. If you have high blood pressure, talk to a registered dietitian about a DASH (Dietary Approaches to Stop Hypertension) eating plan.  Limit sweets and beverages with added sugar, such as soda, to no more than 5 servings per week. One serving is:   1 tablespoon sugar.  1 tablespoon jelly or jam.   cup sorbet.  1 cup lemonade.   cup regular soda. CHOOSING FOODS Starches  Allowed: Breads: All kinds (wheat, rye, raisin, white, oatmeal, [place], [place], and English muffin bread). Low-fat rolls: English muffins, frankfurter and hamburger buns, bagels, pita bread, tortillas (not fried). Pancakes, waffles, biscuits, and muffins made with recommended oil.  Avoid:  Products made with saturated or trans fats, oils, or whole milk products. Butter rolls, cheese breads, croissants. Commercial doughnuts, muffins, sweet rolls, biscuits, waffles, pancakes, store-bought mixes. Crackers  Allowed: Low-fat crackers and snacks: Animal, graham, rye, saltine (with recommended oil, no lard), oyster, and matzo crackers. Bread sticks, melba toast, rusks, flatbread, pretzels, and light popcorn.  Avoid: High-fat crackers: cheese crackers, butter crackers, and those made with coconut, palm oil, or trans fat (hydrogenated oils). Buttered popcorn. Cereals  Allowed: Hot or cold whole-grain cereals.  Avoid: Cereals containing coconut, hydrogenated vegetable fat, or animal fat. Potatoes / Pasta / Rice  Allowed: All kinds of potatoes, rice, and pasta (such as macaroni, spaghetti, and noodles).  Avoid: Pasta or rice prepared with cream sauce or high-fat cheese. Chow mein noodles, [place] fries. Vegetables  Allowed: All vegetables and vegetable juices.  Avoid: Fried vegetables. Vegetables in cream, butter, or high-fat cheese sauces. Limit coconut. Fruit in cream or custard. Protein  Allowed: Limit your intake of meat, seafood, and poultry to no more than 6 oz (cooked weight) per day. All lean, well-trimmed beef, veal, pork, and lamb. All chicken and [place] without skin. All fish and shellfish. Wild game: wild duck, rabbit, pheasant, and venison. Egg whites or low-cholesterol egg substitutes may be used as desired. Meatless dishes: recipes with dried beans, peas, lentils, and tofu (soybean curd). Seeds and nuts: all seeds and most nuts.  Avoid: Prime grade and other heavily marbled and fatty meats, such as short ribs, spare ribs, rib eye roast or steak, frankfurters, sausage, bacon, and high-fat luncheon meats, mutton. Caviar. Commercially fried fish. Domestic duck, goose, venison sausage. Organ meats: liver, gizzard, heart, chitterlings, brains, kidney,  sweetbreads. Dairy  Allowed: Low-fat cheeses: nonfat or low-fat cottage cheese (1% or 2% fat), cheeses made with part skim milk, such as mozzarella, farmers, string, or ricotta. (Cheeses should be labeled no more than 2 to 6 grams fat per oz.). Skim (or 1%) milk: liquid, powdered, or evaporated. Buttermilk made with low-fat milk.  Drinks made with skim or low-fat milk or cocoa. Chocolate milk or cocoa made with skim or low-fat (1%) milk. Nonfat or low-fat yogurt.  Avoid: Whole milk cheeses, including colby, cheddar, [place], Monterey Jack, [place], [place], [place], American, Swiss, and blue. Creamed cottage cheese, cream cheese. Whole milk and whole milk products, including buttermilk or yogurt made from whole milk, drinks made from whole milk. Condensed milk, evaporated whole milk, and 2% milk. Soups and Combination Foods  Allowed: Low-fat low-sodium soups: broth, dehydrated soups, homemade broth, soups with the fat removed, homemade cream soups made with skim or low-fat milk. Low-fat spaghetti, lasagna, chili, and Spanish rice if low-fat ingredients and low-fat cooking techniques are used.  Avoid: Cream soups made with whole milk, cream, or high-fat cheese. All other soups. Desserts and Sweets  Allowed: Sherbet, fruit ices, gelatins, meringues, and angel food cake. Homemade desserts with recommended fats, oils, and milk products. Jam, jelly, honey, marmalade, sugars, and syrups. Pure sugar candy, such as gum drops, hard candy, jelly beans, marshmallows, mints, and small amounts of dark chocolate.  Avoid: Commercially prepared cakes, pies, cookies, frosting, pudding, or mixes for these products. Desserts containing whole milk products, chocolate, coconut, lard, palm oil, or palm kernel oil. Ice cream or ice cream drinks. Candy that contains chocolate, coconut, butter, hydrogenated fat, or unknown ingredients. Buttered syrups. Fats and Oils  Allowed: Vegetable oils: safflower, sunflower, corn,  soybean, cottonseed, sesame, canola, olive, or peanut. Non-hydrogenated margarines. Salad dressing or mayonnaise: homemade or commercial, made with a recommended oil. Low or nonfat salad dressing or mayonnaise.  Limit added fats and oils to 6 to 8 tsp per day (includes fats used in cooking, baking, salads, and spreads on bread). Remember to count the "hidden fats" in foods.  Avoid: Solid fats and shortenings: butter, lard, salt pork, bacon drippings. Gravy containing meat fat, shortening, or suet. Cocoa butter, coconut. Coconut oil, palm oil, palm kernel oil, or hydrogenated oils: these ingredients are often used in bakery products, nondairy creamers, whipped toppings, candy, and commercially fried foods. Read labels carefully. Salad dressings made of unknown oils, sour cream, or cheese, such as blue cheese and Roquefort. Cream, all kinds: half-and-half, light, heavy, or whipping. Sour cream or cream cheese (even if "light" or low-fat). Nondairy cream substitutes: coffee creamers and sour cream substitutes made with palm, palm kernel, hydrogenated oils, or coconut oil. Beverages  Allowed: Coffee (regular or decaffeinated), tea. Diet carbonated beverages, mineral water. Alcohol: Check with your caregiver. Moderation is recommended.  Avoid: Whole milk, regular sodas, and juice drinks with added sugar. Condiments  Allowed: All seasonings and condiments. Cocoa powder. "Cream" sauces made with recommended ingredients.  Avoid: Carob powder made with hydrogenated fats. SAMPLE MENU Breakfast   cup orange juice   cup oatmeal  1 slice toast  1 tsp margarine  1 cup skim milk Lunch  [place] sandwich with 2 oz [place], 2 slices bread  Lettuce and tomato slices  Fresh fruit  Carrot sticks  Coffee or tea Snack  Fresh fruit or low-fat crackers Dinner  3 oz lean ground beef  1 baked potato  1 tsp margarine   cup asparagus  Lettuce salad  1 tbs non-creamy dressing   cup peach  slices  1 cup skim milk Document Released: 03/18/2008 Document Revised: 12/09/2011 Document Reviewed: 09/02/2011 [employer] Patient Information 2015 [place], [place]. This information is not intended to replace advice given to you by your health care provider. Make sure you discuss any questions you have with your health care  provider.

## 2013-12-28 LAB — MRSA CULTURE

## 2014-01-03 ENCOUNTER — Telehealth: Payer: Self-pay | Admitting: Internal Medicine

## 2014-01-03 ENCOUNTER — Encounter: Payer: Self-pay | Admitting: Internal Medicine

## 2014-01-03 NOTE — Telephone Encounter (Signed)
New message     Please call when the FMLA forms are completed on her husband Dealva Lafoy

## 2014-01-04 NOTE — Telephone Encounter (Signed)
Notified pt that Dr. [name] completed paperwork and sending back to medical records, and that someone should contact her when ready.

## 2014-01-05 ENCOUNTER — Encounter (HOSPITAL_COMMUNITY): Payer: Self-pay

## 2014-01-05 ENCOUNTER — Encounter: Payer: Self-pay | Admitting: Internal Medicine

## 2014-01-05 ENCOUNTER — Ambulatory Visit (HOSPITAL_COMMUNITY)
Admission: RE | Admit: 2014-01-05 | Discharge: 2014-01-05 | Disposition: A | Discharge status: Home / Self Care | Payer: BC Managed Care – PPO | Source: Ambulatory Visit | Attending: Cardiology | Admitting: Cardiology

## 2014-01-05 ENCOUNTER — Ambulatory Visit (INDEPENDENT_AMBULATORY_CARE_PROVIDER_SITE_OTHER): Payer: BC Managed Care – PPO | Admitting: *Deleted

## 2014-01-05 VITALS — BP 92/68 | HR 74 | Resp 16 | Wt 267.4 lb

## 2014-01-05 DIAGNOSIS — I10 Essential (primary) hypertension: Secondary | ICD-10-CM | POA: Insufficient documentation

## 2014-01-05 DIAGNOSIS — I509 Heart failure, unspecified: Secondary | ICD-10-CM | POA: Insufficient documentation

## 2014-01-05 DIAGNOSIS — G4733 Obstructive sleep apnea (adult) (pediatric): Secondary | ICD-10-CM

## 2014-01-05 DIAGNOSIS — Z7982 Long term (current) use of aspirin: Secondary | ICD-10-CM | POA: Insufficient documentation

## 2014-01-05 DIAGNOSIS — I5022 Chronic systolic (congestive) heart failure: Secondary | ICD-10-CM

## 2014-01-05 DIAGNOSIS — I252 Old myocardial infarction: Secondary | ICD-10-CM | POA: Insufficient documentation

## 2014-01-05 DIAGNOSIS — I2789 Other specified pulmonary heart diseases: Secondary | ICD-10-CM | POA: Insufficient documentation

## 2014-01-05 DIAGNOSIS — I428 Other cardiomyopathies: Secondary | ICD-10-CM

## 2014-01-05 DIAGNOSIS — Z95810 Presence of automatic (implantable) cardiac defibrillator: Secondary | ICD-10-CM | POA: Insufficient documentation

## 2014-01-05 DIAGNOSIS — E669 Obesity, unspecified: Secondary | ICD-10-CM

## 2014-01-05 LAB — BASIC METABOLIC PANEL
Anion gap: 16 — ABNORMAL HIGH (ref 5–15)
BUN: 11 mg/dL (ref 6–23)
CO2: 23 mEq/L (ref 19–32)
Calcium: 9.5 mg/dL (ref 8.4–10.5)
Chloride: 99 mEq/L (ref 96–112)
Creatinine, Ser: 0.91 mg/dL (ref 0.50–1.10)
GFR calc Af Amer: 89 mL/min — ABNORMAL LOW (ref >90)
GFR calc non Af Amer: 77 mL/min — ABNORMAL LOW (ref >90)
Glucose, Bld: 94 mg/dL (ref 70–99)
Potassium: 4.1 mEq/L (ref 3.7–5.3)
Sodium: 138 mEq/L (ref 137–147)

## 2014-01-05 LAB — MDC_IDC_ENUM_SESS_TYPE_INCLINIC
Battery Remaining Longevity: 138 mo
Battery Voltage: 3.14 V
Brady Statistic RV Percent Paced: 0.01 %
Date Time Interrogation Session: 20150716114020
HighPow Impedance: 209 Ohm
HighPow Impedance: 62 Ohm
Lead Channel Impedance Value: 570 Ohm
Lead Channel Pacing Threshold Amplitude: 0.625 V
Lead Channel Pacing Threshold Pulse Width: 0.4 ms
Lead Channel Sensing Intrinsic Amplitude: 10.125 mV
Lead Channel Sensing Intrinsic Amplitude: 8.875 mV
Lead Channel Setting Pacing Amplitude: 3.5 V
Lead Channel Setting Pacing Pulse Width: 0.4 ms
Lead Channel Setting Sensing Sensitivity: 0.3 mV
Zone Setting Detection Interval: 250 ms
Zone Setting Detection Interval: 300 ms
Zone Setting Detection Interval: 450 ms

## 2014-01-05 MED ORDER — CARVEDILOL 12.5 MG PO TABS: 12.5000 mg | ORAL_TABLET | Freq: Two times a day (BID) | ORAL | Status: DC

## 2014-01-05 NOTE — Progress Notes (Signed)
Wound check appointment for changeout & RV lead revision. Steri-strips removed. Wound without redness or edema. Incision edges approximated, wound well healed. Normal device function. Thresholds, sensing, and impedances consistent with implant measurements. Device programmed at 3.5V for extra safety margin until 3 month visit. Histogram distribution appropriate for patient and level of activity. No ventricular arrhythmias noted. Patient educated about wound care, arm mobility, lifting restrictions. ROV w/ Dr. [name] 04/11/14.

## 2014-01-05 NOTE — Progress Notes (Signed)
Patient ID: [name], female   DOB: Oct 12, 1970, 43 y.o.   MRN: [id number]  Primary Cardiologist: Dr [name]  PCP: [facility] in [place]  HPI: [name] is a 43 y.o. 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  She has been followed by Dr. Cammie Sickle for systolic HF. She is s/p ICD. Apparently her EF recovered to 35-40%. However was recently admitted to [facility] 09/19/13 for worsening dyspnea and CP. Coronaries were reportedly OK. EF of 15-20%.2-D echo demonstrated four-chamber enlargement with moderate-severe TR/MR. Cath as below. Rheum w/u negative  RHC 09/24/13  RA 14  RV 57/22  PA 64/36 (48)  PCWP - 18  Fick CO/CI: 3.7/1.6  PVR 8.1 WU  Ao sat 95%  PA sat 47%   CPX  11/09/13  FEV1 2.6L (85%)  FVC 2.1L (86%)  FEV1/FVC 86%  pVO2 14.8 (82%)  Slope 31.7%  RER 1.11  Ve/MVV 86%   Follow up for Heart Failure: Last visit increased coreg to 12.5 mg in the am and 18.75 mg q pm which she tolerated up until a few days ago when she started getting dizziness just at night. Has held night time meds for 2 days. Denies SOB, PND,orthopnea or CP. Able to walk to mailbox without stopping. Weight at home 266 lbs. Taking medications as prescribed. Following a low salt diet and drinking less than 2L a day.     (11/09/13): Dig level 0.5, K 3.6, Creatinine 0.83, pro-BNP 622 (12/19/13): K 3.6, creatinine 0.8  SH: Disabled since 2014. Lives her husband and 2 children. Does not drink alcohol or smoke   FH: Grandmother had heart failure   ROS: All systems negative except as listed in HPI, PMH and Problem List.  Past Medical History  Diagnosis Date  . Hypertension   . Nonischemic cardiomyopathy   . Sinus tachycardia   . Nocturnal dyspnea   . Chronic CHF (congestive heart failure)     a. EF 15-20% b. RHC (09/2013) RA 14, RV 57/22, PA 64/36 (48), PCWP 18, FIck CO/CI 3.7/1.6, PVR 8.1 WU, PA sat 47%   . Morbid obesity   . Snoring-prob OSA  09/04/2011  . Sprint Fidelis ICD lead RECALL  [id number]   . Automatic implantable cardioverter-defibrillator in situ   . Myocardial infarction 08/2013  . Hypothyroidism   . History of stomach ulcers     Current Outpatient Prescriptions  Medication Sig Dispense Refill  . acetaminophen (TYLENOL) 325 MG tablet Take 650 mg by mouth daily as needed for headache.      Marland Kitchen aspirin 81 MG tablet Take 81 mg by mouth daily.      . carvedilol (COREG) 12.5 MG tablet Take 12.5-18.75 mg by mouth 2 (two) times daily with a meal. Pt takes 12.5mg  in a.m. and 18.75 mg in p.m.      Marland Kitchen digoxin (LANOXIN) 0.125 MG tablet Take 0.125 mg by mouth daily.      . furosemide (LASIX) 40 MG tablet Take 80 mg by mouth daily.      Marland Kitchen levothyroxine (SYNTHROID, LEVOTHROID) 88 MCG tablet Take 88 mcg by mouth daily before breakfast.      . lisinopril (PRINIVIL,ZESTRIL) 10 MG tablet Take 5-10 mg by mouth 2 (two) times daily. Take 10 mg in am and 5 mg in pm      . spironolactone (ALDACTONE) 25 MG tablet Take 25 mg by mouth daily.       No current  facility-administered medications for this encounter.    Filed Vitals:   01/05/14 1011  BP: 92/68  Pulse: 74  Resp: 16  Weight: 267 lb 7 oz (121.309 kg)  SpO2: 99%    PHYSICAL EXAM: General: Well appearing. No respiratory difficulty  HEENT: normal  Neck: supple. JVP difficult to assess d/t body habitus but appears about 8 . Carotids 2+ bilat; no bruits. No lymphadenopathy. Goiter  Cor: PMI nondisplaced. Regular rate & rhythm. No rubs, gallops or murmurs.  Lungs: clear  Abdomen: obese. soft, nontender, nondistended. No hepatosplenomegaly. No bruits or masses. Good bowel sounds.  Extremities: no cyanosis, clubbing, rash, traced edema  Neuro: alert & oriented x 3, cranial nerves grossly intact. moves all 4 extremities w/o difficulty. Affect pleasant.   ASSESSMENT & PLAN:  1. Chronic systolic HF: NICM, EF 17-49% RV dysfunction, mod/severe TR/MR (09/2013 at [facility]) S/P [employer] ICD.  -  NYHA III/IIIb symptoms and volume status stable. Weight stable will continue lasix 80 mg daily. Discussed the use of sliding scale diuretics. - Having dizziness at night, will cut coreg back to 12.5 mg BID. Instructed to call if dizziness does not improve. - Continue lisinopril 10 mg q am and 5 mg q pm. - Continue digoxin 0.125 mg daily. Last dig level 0.5 (11/09/13) - Continue spiro 25 mg daily. - Will repeat ECHO. - Patient continues to have III/IIIb symptoms and BP soft. On CPX did not show any evidence of cardiac limitation, however on RHC she has low CO/CI and high PVR. She did not have Thermo calculation. She may need repeat RHC to reassess numbers she did have elevated PVR and may need PDE-5 inhibitor, but likely major issue is aggressive weight loss. She is getting evaluated by pulmonary and they are working on getting her scheduled for sleep study but are having issues with insurance approval.  - Reinforced the need and importance of daily weights, a low sodium diet, and fluid restriction (less than 2 L a day). Instructed to call the HF clinic if weight increases more than 3 lbs overnight or 5 lbs in a week.  2. Pulmonary HTN, mixed picture Results reviewed from [facility] with PVR 8. As above may need repeat RHC 3. Morbid obesity- Patient needs to lose weight and understands this and is currently working on following [employer]. Will continue to work on increasing exercise and watch portion control.  4. OSA: Has been referred to pulmonary and is awaiting sleep study.   F/U 1 month with ECHO [name] B NP-C 10:29 AM

## 2014-01-05 NOTE — Patient Instructions (Signed)
Doing well.  Will cut back coreg to 12.5 mg (1 tablet) twice a day. If you tolerate and do not have anymore dizziness you can try again to increase to 12.5 mg (1 tablet) in the morning and 18.75 mg (1 1/2 tablets) nightly.  Call if dizziness continues  Follow up 1 month with ECHO  Do the following things EVERYDAY: 1) Weigh yourself in the morning before breakfast. Write it down and keep it in a log. 2) Take your medicines as prescribed 3) Eat low salt foods-Limit salt (sodium) to 2000 mg per day.  4) Stay as active as you can everyday 5) Limit all fluids for the day to less than 2 liters 6)

## 2014-01-11 DIAGNOSIS — G4710 Hypersomnia, unspecified: Secondary | ICD-10-CM

## 2014-01-11 DIAGNOSIS — G4730 Sleep apnea, unspecified: Secondary | ICD-10-CM

## 2014-01-26 ENCOUNTER — Telehealth: Payer: Self-pay | Admitting: Pulmonary Disease

## 2014-01-26 ENCOUNTER — Encounter: Payer: Self-pay | Admitting: Pulmonary Disease

## 2014-01-26 DIAGNOSIS — G4710 Hypersomnia, unspecified: Secondary | ICD-10-CM

## 2014-01-26 DIAGNOSIS — G4730 Sleep apnea, unspecified: Secondary | ICD-10-CM

## 2014-01-26 NOTE — Telephone Encounter (Signed)
Pt needs ov to review her recent sleep study

## 2014-01-26 NOTE — Telephone Encounter (Signed)
appt set for Monday 8-10 at [place], [place]

## 2014-01-30 ENCOUNTER — Ambulatory Visit (INDEPENDENT_AMBULATORY_CARE_PROVIDER_SITE_OTHER): Payer: BC Managed Care – PPO | Admitting: Pulmonary Disease

## 2014-01-30 ENCOUNTER — Encounter: Payer: Self-pay | Admitting: Pulmonary Disease

## 2014-01-30 VITALS — BP 118/80 | HR 77 | Temp 97.6°F | Ht 64.0 in | Wt 274.0 lb

## 2014-01-30 DIAGNOSIS — G478 Other sleep disorders: Secondary | ICD-10-CM

## 2014-01-30 NOTE — Patient Instructions (Signed)
Work aggressively on weight loss, and try to avoid sleeping on your back if possible. If you are not making progress, and feel you are struggling with your sleep, let us know and we can consider repeating your study without the head of your bed elevated.

## 2014-01-30 NOTE — Assessment & Plan Note (Signed)
The patient on clinical grounds clearly has some degree of sleep disordered breathing, but does not have obstructive sleep apnea by her sleep study. 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 The patient did her sleep study with head of her bed raised because of her underlying chronic congestive heart failure, and this can sometimes minimize sleep apnea. I have offered to try and get CPAP or a dental appliance approved through insurance, but I suspect it will be very unsuccessful because of her sleep study results. At this point, she would like to take a period of time to work on weight reduction and avoiding supine sleep. She will call me if she is not making progress.

## 2014-01-30 NOTE — Progress Notes (Signed)
   Subjective:    Patient ID: [name], female    DOB: [birth year], 43 y.o.   MRN: [id number]  HPI The patient comes in today for followup of her recent sleep study. She was found to have obstructive sleep apnea, but did have an AHI of 4 of events per hour. She also had numerous other hypopneas noted, but did not have concomitant desaturation in order to be scored as such. I had the study with the patient in detail, and answered all of her questions.   Review of Systems  Constitutional: Negative for fever and unexpected weight change.  HENT: Negative for congestion, dental problem, ear pain, nosebleeds, postnasal drip, rhinorrhea, sinus pressure, sneezing, sore throat and trouble swallowing.   Eyes: Negative for redness and itching.  Respiratory: Negative for cough, chest tightness, shortness of breath and wheezing.   Cardiovascular: Negative for palpitations and leg swelling.  Gastrointestinal: Negative for nausea and vomiting.  Genitourinary: Negative for dysuria.  Musculoskeletal: Negative for joint swelling.  Skin: Negative for rash.  Neurological: Negative for headaches.  Hematological: Does not bruise/bleed easily.  Psychiatric/Behavioral: Negative for dysphoric mood. The patient is not nervous/anxious.        Objective:   Physical Exam Morbidly obese female in no acute distress Nose without purulence or discharge noted Neck without lymphadenopathy or thyromegaly Lower extremities with mild edema, no cyanosis Alert and oriented, moves all 4 extremities.       Assessment & Plan:

## 2014-02-06 ENCOUNTER — Encounter (HOSPITAL_COMMUNITY): Payer: Self-pay

## 2014-02-06 ENCOUNTER — Ambulatory Visit (HOSPITAL_BASED_OUTPATIENT_CLINIC_OR_DEPARTMENT_OTHER)
Admission: RE | Admit: 2014-02-06 | Discharge: 2014-02-06 | Disposition: A | Discharge status: Home / Self Care | Payer: BC Managed Care – PPO | Source: Ambulatory Visit | Attending: Cardiology | Admitting: Cardiology

## 2014-02-06 ENCOUNTER — Other Ambulatory Visit: Payer: Self-pay | Admitting: *Deleted

## 2014-02-06 ENCOUNTER — Encounter (HOSPITAL_COMMUNITY): Payer: Self-pay | Admitting: *Deleted

## 2014-02-06 ENCOUNTER — Ambulatory Visit (HOSPITAL_COMMUNITY)
Admission: RE | Admit: 2014-02-06 | Discharge: 2014-02-06 | Disposition: A | Discharge status: Home / Self Care | Payer: BC Managed Care – PPO | Source: Ambulatory Visit | Attending: Family Medicine | Admitting: Family Medicine

## 2014-02-06 VITALS — BP 117/74 | HR 79 | Wt 268.0 lb

## 2014-02-06 DIAGNOSIS — I059 Rheumatic mitral valve disease, unspecified: Secondary | ICD-10-CM

## 2014-02-06 DIAGNOSIS — I4949 Other premature depolarization: Secondary | ICD-10-CM

## 2014-02-06 DIAGNOSIS — I5022 Chronic systolic (congestive) heart failure: Secondary | ICD-10-CM

## 2014-02-06 DIAGNOSIS — I509 Heart failure, unspecified: Secondary | ICD-10-CM | POA: Diagnosis not present

## 2014-02-06 DIAGNOSIS — I493 Ventricular premature depolarization: Secondary | ICD-10-CM

## 2014-02-06 DIAGNOSIS — I272 Pulmonary hypertension, unspecified: Secondary | ICD-10-CM

## 2014-02-06 DIAGNOSIS — I2789 Other specified pulmonary heart diseases: Secondary | ICD-10-CM

## 2014-02-06 MED ORDER — LISINOPRIL 10 MG PO TABS: 10.0000 mg | ORAL_TABLET | Freq: Two times a day (BID) | ORAL | Status: DC

## 2014-02-06 NOTE — Patient Instructions (Signed)
Increase Lisinopril to 10 mg Twice daily   Your physician has recommended that you wear a holter monitor. Holter monitors are medical devices that record the heart's electrical activity. Doctors most often use these monitors to diagnose arrhythmias. Arrhythmias are problems with the speed or rhythm of the heartbeat. The monitor is a small, portable device. You can wear one while you do your normal daily activities. This is usually used to diagnose what is causing palpitations/syncope (passing out).  Right Heart Catheterization Wednesday 02/15/14, see instruction sheet  Your physician recommends that you schedule a follow-up appointment in: 2 months

## 2014-02-06 NOTE — Progress Notes (Signed)
  Echocardiogram 2D Echocardiogram has been performed.  [name] 02/06/2014, 11:58 AM

## 2014-02-07 DIAGNOSIS — I272 Pulmonary hypertension, unspecified: Secondary | ICD-10-CM | POA: Insufficient documentation

## 2014-02-07 NOTE — Progress Notes (Signed)
Patient ID: [name], female   DOB: 08-Aug-1970, 43 y.o.   MRN: [id number]  Primary Cardiologist: Dr [name]  PCP: [facility] in [place]  HPI: [name] is a 43 y.o. 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  She was followed for systolic CHF initially in [place]. Apparently her EF recovered to 35-40%. However, she was admitted to [facility] 09/19/13 for worsening dyspnea and CP. Coronaries were reportedly OK on LHC at that time at [facility]. Echo showed EF of 15-20% with four-chamber enlargement with moderate-severe TR/MR. RHC as below. Rheumatological w/u negative  RHC 09/24/13  RA 14  RV 57/22  PA 64/36 (48)  PCWP 18  Fick CO/CI: 3.7/1.6  PVR 8.1 WU  Ao sat 95%  PA sat 47%   CPX  11/09/13  FEV1 2.6L (85%)  FVC 2.1L (86%)  FEV1/FVC 86%  pVO2 14.8 (82%)  Slope 31.7%  RER 1.11  Ve/MVV 86%   Echo (8/15) with EF 20%, moderate LV dilation, diffuse hypokinesis, normal RV size with mildly decreased systolic function, mild MR.   Sleep study did not show significant OSA.   Despite low output noted on RHC in 4/15, she has been doing quite well symptomatically.  She is dyspneic only when she walks "long distances"   She can walk around stores without problems.  No chest pain.  She has slept propped up for a long time (no change).  No PND.  No chest pain.  She does have frequent palpitations and was noted on today's echo to have a lot of PVCs.  Weight is stable.  BP is difficult to obtain but finally got reading of 117/74.  No lightheadedness.    Labs: (11/09/13): Dig level 0.5, K 3.6, Creatinine 0.83, pro-BNP 622 (12/19/13): K 3.6, creatinine 0.8 (7/15): K 4.1, creatinine 0.91  SH: Disabled since 2014. Lives her husband and 2 children. Does not drink alcohol or smoke   FH: Grandmother had heart failure   ROS: All systems negative except as listed in HPI, PMH and Problem List.  Past Medical History   Diagnosis Date  . Hypertension   . Nonischemic cardiomyopathy   . Sinus tachycardia   . Nocturnal dyspnea   . Chronic CHF (congestive heart failure)     a. EF 15-20% b. RHC (09/2013) RA 14, RV 57/22, PA 64/36 (48), PCWP 18, FIck CO/CI 3.7/1.6, PVR 8.1 WU, PA sat 47%   . Morbid obesity   . Snoring-prob OSA 09/04/2011  . Sprint Fidelis ICD lead RECALL  [id number]   . Automatic implantable cardioverter-defibrillator in situ   . Myocardial infarction 08/2013  . Hypothyroidism   . History of stomach ulcers     Current Outpatient Prescriptions  Medication Sig Dispense Refill  . acetaminophen (TYLENOL) 325 MG tablet Take 650 mg by mouth daily as needed for headache.      Marland Kitchen aspirin 81 MG tablet Take 81 mg by mouth daily.      . carvedilol (COREG) 12.5 MG tablet Take 1 tablet (12.5 mg total) by mouth 2 (two) times daily with a meal.  60 tablet  4  . digoxin (LANOXIN) 0.125 MG tablet Take 0.125 mg by mouth daily.      . furosemide (LASIX) 40 MG tablet Take 80 mg by mouth daily.      Marland Kitchen levothyroxine (SYNTHROID, LEVOTHROID) 88 MCG tablet Take 88 mcg by mouth daily before breakfast.      .  lisinopril (PRINIVIL,ZESTRIL) 10 MG tablet Take 1 tablet (10 mg total) by mouth 2 (two) times daily.      Marland Kitchen spironolactone (ALDACTONE) 25 MG tablet Take 25 mg by mouth daily.       No current facility-administered medications for this encounter.    Filed Vitals:   02/06/14 1157  BP: 117/74  Pulse: 79  Weight: 268 lb (121.564 kg)  SpO2: 98%    PHYSICAL EXAM: General: Well appearing. No respiratory difficulty  HEENT: normal  Neck: supple. JVP not elevated. Carotids 2+ bilat; no bruits. No lymphadenopathy. Goiter  Cor: PMI nondisplaced. Regular rate & rhythm. No rubs, gallops or murmurs.  No edema.  Lungs: clear  Abdomen: obese. soft, nontender, nondistended. No hepatosplenomegaly. No bruits or masses. Good bowel sounds.  Extremities: no cyanosis, clubbing, rash. Neuro: alert & oriented x 3, cranial nerves  grossly intact. moves all 4 extremities w/o difficulty. Affect pleasant.   ASSESSMENT & PLAN:  1. Chronic systolic HF: Nonischemic cardiomyopathy.  I reviewed today's echo: EF 20% with diffuse hypokinesis and moderate LV dilation, mild MR.  Cause for cardiomyopathy is uncertain: Initially noted peri-partum (after son's birth), so cannot rule out peri-partum CMP.  She also has quite frequent PVCs.  I will need to get a holter to assess total PVC burden.  If > 15% total PVCs, would be concerned that this is contributing to low EF. Currently, NYHA class II symptoms and does not appear volume overloaded. - Continue current Coreg, spironolactone, Lasix and digoxin.  - Check digoxin level.  - Increase lisinopril to 10 mg bid with BMET in 10 days.   - I am going to repeat RHC.  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  Will reassess on repeat RHC.  2. Pulmonary HTN: Mixed picture with PVR 8.1 on RHC at [facility].  As above, will repeat study given symptomatic improvement.  3. Morbid obesity: Patient needs to lose weight and understands this and is currently working on following [employer]. Will continue to work on increasing exercise and watch portion control.  4. OSA:  Surprisingly, no significant OSA on sleep study.  5. PVCs: As above, 48 hour holter to assess PVC burder (could these be contributing to cardiomyopathy?).    [name] 02/07/2014

## 2014-02-08 ENCOUNTER — Encounter: Payer: Self-pay | Admitting: *Deleted

## 2014-02-08 ENCOUNTER — Encounter (INDEPENDENT_AMBULATORY_CARE_PROVIDER_SITE_OTHER): Payer: BC Managed Care – PPO

## 2014-02-08 ENCOUNTER — Encounter (HOSPITAL_COMMUNITY): Payer: Self-pay | Admitting: Pharmacy Technician

## 2014-02-08 DIAGNOSIS — I493 Ventricular premature depolarization: Secondary | ICD-10-CM

## 2014-02-08 DIAGNOSIS — I4949 Other premature depolarization: Secondary | ICD-10-CM

## 2014-02-08 NOTE — Progress Notes (Signed)
Patient ID: [name], female   DOB: 04/22/1971, 43 y.o.   MRN: [id number] E-Cardio 24 hour holter monitor applied to patient.

## 2014-02-15 ENCOUNTER — Encounter (HOSPITAL_COMMUNITY)
Admission: RE | Disposition: A | Discharge status: Home / Self Care | Payer: Self-pay | Source: Ambulatory Visit | Attending: Cardiology

## 2014-02-15 ENCOUNTER — Ambulatory Visit (HOSPITAL_COMMUNITY)
Admission: RE | Admit: 2014-02-15 | Discharge: 2014-02-15 | Disposition: A | Discharge status: Home / Self Care | Payer: BC Managed Care – PPO | Source: Ambulatory Visit | Attending: Cardiology | Admitting: Cardiology

## 2014-02-15 DIAGNOSIS — I059 Rheumatic mitral valve disease, unspecified: Secondary | ICD-10-CM | POA: Insufficient documentation

## 2014-02-15 DIAGNOSIS — Z95810 Presence of automatic (implantable) cardiac defibrillator: Secondary | ICD-10-CM | POA: Insufficient documentation

## 2014-02-15 DIAGNOSIS — I5022 Chronic systolic (congestive) heart failure: Secondary | ICD-10-CM | POA: Insufficient documentation

## 2014-02-15 DIAGNOSIS — E039 Hypothyroidism, unspecified: Secondary | ICD-10-CM | POA: Insufficient documentation

## 2014-02-15 DIAGNOSIS — Z7982 Long term (current) use of aspirin: Secondary | ICD-10-CM | POA: Diagnosis not present

## 2014-02-15 DIAGNOSIS — I428 Other cardiomyopathies: Secondary | ICD-10-CM | POA: Insufficient documentation

## 2014-02-15 DIAGNOSIS — I10 Essential (primary) hypertension: Secondary | ICD-10-CM | POA: Diagnosis not present

## 2014-02-15 DIAGNOSIS — I252 Old myocardial infarction: Secondary | ICD-10-CM | POA: Insufficient documentation

## 2014-02-15 DIAGNOSIS — G4733 Obstructive sleep apnea (adult) (pediatric): Secondary | ICD-10-CM | POA: Insufficient documentation

## 2014-02-15 DIAGNOSIS — R0609 Other forms of dyspnea: Secondary | ICD-10-CM | POA: Diagnosis present

## 2014-02-15 DIAGNOSIS — I509 Heart failure, unspecified: Secondary | ICD-10-CM | POA: Insufficient documentation

## 2014-02-15 DIAGNOSIS — R0989 Other specified symptoms and signs involving the circulatory and respiratory systems: Secondary | ICD-10-CM | POA: Diagnosis present

## 2014-02-15 DIAGNOSIS — I2789 Other specified pulmonary heart diseases: Secondary | ICD-10-CM | POA: Insufficient documentation

## 2014-02-15 DIAGNOSIS — Z6841 Body Mass Index (BMI) 40.0 and over, adult: Secondary | ICD-10-CM | POA: Diagnosis not present

## 2014-02-15 DIAGNOSIS — I079 Rheumatic tricuspid valve disease, unspecified: Secondary | ICD-10-CM | POA: Insufficient documentation

## 2014-02-15 HISTORY — PX: RIGHT HEART CATHETERIZATION: SHX5447

## 2014-02-15 LAB — POCT I-STAT 3, VENOUS BLOOD GAS (G3P V)
Acid-Base Excess: 1 mmol/L (ref 0.0–2.0)
Acid-Base Excess: 2 mmol/L (ref 0.0–2.0)
Bicarbonate: 26.9 mEq/L — ABNORMAL HIGH (ref 20.0–24.0)
Bicarbonate: 27.4 mEq/L — ABNORMAL HIGH (ref 20.0–24.0)
O2 Saturation: 66 %
O2 Saturation: 68 %
TCO2: 28 mmol/L (ref 0–100)
TCO2: 29 mmol/L (ref 0–100)
pCO2, Ven: 45.4 mmHg (ref 45.0–50.0)
pCO2, Ven: 46.2 mmHg (ref 45.0–50.0)
pH, Ven: 7.38 — ABNORMAL HIGH (ref 7.250–7.300)
pH, Ven: 7.381 — ABNORMAL HIGH (ref 7.250–7.300)
pO2, Ven: 35 mmHg (ref 30.0–45.0)
pO2, Ven: 36 mmHg (ref 30.0–45.0)

## 2014-02-15 LAB — BASIC METABOLIC PANEL
Anion gap: 11 (ref 5–15)
BUN: 7 mg/dL (ref 6–23)
CO2: 27 mEq/L (ref 19–32)
Calcium: 8.8 mg/dL (ref 8.4–10.5)
Chloride: 102 mEq/L (ref 96–112)
Creatinine, Ser: 0.91 mg/dL (ref 0.50–1.10)
GFR calc Af Amer: 88 mL/min — ABNORMAL LOW (ref >90)
GFR calc non Af Amer: 76 mL/min — ABNORMAL LOW (ref >90)
Glucose, Bld: 87 mg/dL (ref 70–99)
Potassium: 3.9 mEq/L (ref 3.7–5.3)
Sodium: 140 mEq/L (ref 137–147)

## 2014-02-15 LAB — CBC
HCT: 37.9 % (ref 36.0–46.0)
Hemoglobin: 12.6 g/dL (ref 12.0–15.0)
MCH: 28.1 pg (ref 26.0–34.0)
MCHC: 33.2 g/dL (ref 30.0–36.0)
MCV: 84.4 fL (ref 78.0–100.0)
Platelets: 242 10*3/uL (ref 150–400)
RBC: 4.49 MIL/uL (ref 3.87–5.11)
RDW: 13.7 % (ref 11.5–15.5)
WBC: 8.7 10*3/uL (ref 4.0–10.5)

## 2014-02-15 LAB — DIGOXIN LEVEL: Digoxin Level: 0.4 ng/mL — ABNORMAL LOW (ref 0.8–2.0)

## 2014-02-15 LAB — PROTIME-INR
INR: 1.13 (ref 0.00–1.49)
Prothrombin Time: 14.5 seconds (ref 11.6–15.2)

## 2014-02-15 SURGERY — RIGHT HEART CATH
Anesthesia: LOCAL

## 2014-02-15 MED ORDER — ONDANSETRON HCL 4 MG/2ML IJ SOLN: 4.0000 mg | Freq: Four times a day (QID) | INTRAMUSCULAR | Status: DC | PRN

## 2014-02-15 MED ORDER — SODIUM CHLORIDE 0.9 % IJ SOLN: 3.0000 mL | Freq: Two times a day (BID) | INTRAMUSCULAR | Status: DC

## 2014-02-15 MED ORDER — SODIUM CHLORIDE 0.9 % IV SOLN
INTRAVENOUS | Status: DC
  Administered 2014-02-15: 11:00:00 via INTRAVENOUS

## 2014-02-15 MED ORDER — SODIUM CHLORIDE 0.9 % IV SOLN: 250.0000 mL | INTRAVENOUS | Status: DC | PRN

## 2014-02-15 MED ORDER — LIDOCAINE HCL (PF) 1 % IJ SOLN
INTRAMUSCULAR | Status: AC
  Filled 2014-02-15: qty 30

## 2014-02-15 MED ORDER — SODIUM CHLORIDE 0.9 % IJ SOLN: 3.0000 mL | INTRAMUSCULAR | Status: DC | PRN

## 2014-02-15 MED ORDER — ASPIRIN 81 MG PO CHEW
81.0000 mg | CHEWABLE_TABLET | ORAL | Status: AC
  Administered 2014-02-15: 81 mg via ORAL

## 2014-02-15 MED ORDER — HEPARIN (PORCINE) IN NACL 2-0.9 UNIT/ML-% IJ SOLN
INTRAMUSCULAR | Status: AC
  Filled 2014-02-15: qty 1000

## 2014-02-15 MED ORDER — ACETAMINOPHEN 325 MG PO TABS: 650.0000 mg | ORAL_TABLET | ORAL | Status: DC | PRN

## 2014-02-15 MED ORDER — FENTANYL CITRATE 0.05 MG/ML IJ SOLN
INTRAMUSCULAR | Status: AC
Start: 2014-02-15 — End: 2014-02-15
  Filled 2014-02-15: qty 2

## 2014-02-15 MED ORDER — MIDAZOLAM HCL 2 MG/2ML IJ SOLN
INTRAMUSCULAR | Status: AC
  Filled 2014-02-15: qty 2

## 2014-02-15 MED ORDER — ASPIRIN 81 MG PO CHEW
CHEWABLE_TABLET | ORAL | Status: AC
  Administered 2014-02-15: 81 mg via ORAL
  Filled 2014-02-15: qty 1

## 2014-02-15 NOTE — Interval H&P Note (Signed)
History and Physical Interval Note:  02/15/2014 12:53 PM  [name]  has presented today for surgery, with the diagnosis of hf  The various methods of treatment have been discussed with the patient and family. After consideration of risks, benefits and other options for treatment, the patient has consented to  Procedure(s): RIGHT HEART CATH (N/A) as a surgical intervention .  The patient's history has been reviewed, patient examined, no change in status, stable for surgery.  I have reviewed the patient's chart and labs.  Questions were answered to the patient's satisfaction.     Amil Moseman [employer]

## 2014-02-15 NOTE — Discharge Instructions (Signed)

## 2014-02-15 NOTE — H&P (View-Only) (Signed)
Patient ID: [name], female   DOB: 01/24/1971, 43 y.o.   MRN: [id number]  Primary Cardiologist: Dr [name]  PCP: [facility] in [place]  HPI: [name] is a 43 y.o. 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  She was followed for systolic CHF initially in [place]. Apparently her EF recovered to 35-40%. However, she was admitted to [facility] 09/19/13 for worsening dyspnea and CP. Coronaries were reportedly OK on LHC at that time at [facility]. Echo showed EF of 15-20% with four-chamber enlargement with moderate-severe TR/MR. RHC as below. Rheumatological w/u negative  RHC 09/24/13  RA 14  RV 57/22  PA 64/36 (48)  PCWP 18  Fick CO/CI: 3.7/1.6  PVR 8.1 WU  Ao sat 95%  PA sat 47%   CPX  11/09/13  FEV1 2.6L (85%)  FVC 2.1L (86%)  FEV1/FVC 86%  pVO2 14.8 (82%)  Slope 31.7%  RER 1.11  Ve/MVV 86%   Echo (8/15) with EF 20%, moderate LV dilation, diffuse hypokinesis, normal RV size with mildly decreased systolic function, mild MR.   Sleep study did not show significant OSA.   Despite low output noted on RHC in 4/15, she has been doing quite well symptomatically.  She is dyspneic only when she walks "long distances"   She can walk around stores without problems.  No chest pain.  She has slept propped up for a long time (no change).  No PND.  No chest pain.  She does have frequent palpitations and was noted on today's echo to have a lot of PVCs.  Weight is stable.  BP is difficult to obtain but finally got reading of 117/74.  No lightheadedness.    Labs: (11/09/13): Dig level 0.5, K 3.6, Creatinine 0.83, pro-BNP 622 (12/19/13): K 3.6, creatinine 0.8 (7/15): K 4.1, creatinine 0.91  SH: Disabled since 2014. Lives her husband and 2 children. Does not drink alcohol or smoke   FH: Grandmother had heart failure   ROS: All systems negative except as listed in HPI, PMH and Problem List.  Past Medical History   Diagnosis Date  . Hypertension   . Nonischemic cardiomyopathy   . Sinus tachycardia   . Nocturnal dyspnea   . Chronic CHF (congestive heart failure)     a. EF 15-20% b. RHC (09/2013) RA 14, RV 57/22, PA 64/36 (48), PCWP 18, FIck CO/CI 3.7/1.6, PVR 8.1 WU, PA sat 47%   . Morbid obesity   . Snoring-prob OSA 09/04/2011  . Sprint Fidelis ICD lead RECALL  [id number]   . Automatic implantable cardioverter-defibrillator in situ   . Myocardial infarction 08/2013  . Hypothyroidism   . History of stomach ulcers     Current Outpatient Prescriptions  Medication Sig Dispense Refill  . acetaminophen (TYLENOL) 325 MG tablet Take 650 mg by mouth daily as needed for headache.      Marland Kitchen aspirin 81 MG tablet Take 81 mg by mouth daily.      . carvedilol (COREG) 12.5 MG tablet Take 1 tablet (12.5 mg total) by mouth 2 (two) times daily with a meal.  60 tablet  4  . digoxin (LANOXIN) 0.125 MG tablet Take 0.125 mg by mouth daily.      . furosemide (LASIX) 40 MG tablet Take 80 mg by mouth daily.      Marland Kitchen levothyroxine (SYNTHROID, LEVOTHROID) 88 MCG tablet Take 88 mcg by mouth daily before breakfast.      .  lisinopril (PRINIVIL,ZESTRIL) 10 MG tablet Take 1 tablet (10 mg total) by mouth 2 (two) times daily.      Marland Kitchen spironolactone (ALDACTONE) 25 MG tablet Take 25 mg by mouth daily.       No current facility-administered medications for this encounter.    Filed Vitals:   02/06/14 1157  BP: 117/74  Pulse: 79  Weight: 268 lb (121.564 kg)  SpO2: 98%    PHYSICAL EXAM: General: Well appearing. No respiratory difficulty  HEENT: normal  Neck: supple. JVP not elevated. Carotids 2+ bilat; no bruits. No lymphadenopathy. Goiter  Cor: PMI nondisplaced. Regular rate & rhythm. No rubs, gallops or murmurs.  No edema.  Lungs: clear  Abdomen: obese. soft, nontender, nondistended. No hepatosplenomegaly. No bruits or masses. Good bowel sounds.  Extremities: no cyanosis, clubbing, rash. Neuro: alert & oriented x 3, cranial nerves  grossly intact. moves all 4 extremities w/o difficulty. Affect pleasant.   ASSESSMENT & PLAN:  1. Chronic systolic HF: Nonischemic cardiomyopathy.  I reviewed today's echo: EF 20% with diffuse hypokinesis and moderate LV dilation, mild MR.  Cause for cardiomyopathy is uncertain: Initially noted peri-partum (after son's birth), so cannot rule out peri-partum CMP.  She also has quite frequent PVCs.  I will need to get a holter to assess total PVC burden.  If > 15% total PVCs, would be concerned that this is contributing to low EF. Currently, NYHA class II symptoms and does not appear volume overloaded. - Continue current Coreg, spironolactone, Lasix and digoxin.  - Check digoxin level.  - Increase lisinopril to 10 mg bid with BMET in 10 days.   - I am going to repeat RHC.  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  Will reassess on repeat RHC.  2. Pulmonary HTN: Mixed picture with PVR 8.1 on RHC at [facility].  As above, will repeat study given symptomatic improvement.  3. Morbid obesity: Patient needs to lose weight and understands this and is currently working on following [employer]. Will continue to work on increasing exercise and watch portion control.  4. OSA:  Surprisingly, no significant OSA on sleep study.  5. PVCs: As above, 48 hour holter to assess PVC burder (could these be contributing to cardiomyopathy?).    [name] 02/07/2014

## 2014-02-15 NOTE — CV Procedure (Signed)
    Cardiac Catheterization Procedure Note  Name: [name] MRN: [id number] DOB: [birth year]  Procedure: Right Heart Cath  Indication: Cardiomyopathy, low output failure   Procedural Details: The brachial area was prepped, draped, and anesthetized with 1% lidocaine. There was a pre-existing peripheral IV in the right brachial area.  This was exchanged out for a 29F venous sheath. A Swan-Ganz catheter was used for the right heart catheterization. Standard protocol was followed for recording of right heart pressures and sampling of oxygen saturations. Fick cardiac output was calculated.  There were no immediate procedural complications. The patient was transferred to the post catheterization recovery area for further monitoring.  Procedural Findings: Hemodynamics (mmHg) RA mean 3 mmHg RV 32/2 PA 33/11, mean 19 PCWP mean 8   Oxygen saturations: PA 67% AO 100%  Cardiac Output (Fick) 5.22  Cardiac Index (Fick) 2.35   Final Conclusions:  Normal left and right heart filling pressures with significantly improved cardiac output.    Recommendations: Continue current medication regimen.  She is showing improvement.   [name] 02/15/2014, 1:11 PM

## 2014-02-25 ENCOUNTER — Other Ambulatory Visit (HOSPITAL_COMMUNITY): Payer: Self-pay | Admitting: Adult Health

## 2014-02-25 DIAGNOSIS — I509 Heart failure, unspecified: Secondary | ICD-10-CM

## 2014-04-11 ENCOUNTER — Encounter: Payer: Self-pay | Admitting: Internal Medicine

## 2014-04-11 ENCOUNTER — Ambulatory Visit (INDEPENDENT_AMBULATORY_CARE_PROVIDER_SITE_OTHER): Payer: BC Managed Care – PPO | Admitting: Internal Medicine

## 2014-04-11 VITALS — BP 98/72 | HR 68 | Ht 63.5 in | Wt 275.8 lb

## 2014-04-11 DIAGNOSIS — I429 Cardiomyopathy, unspecified: Secondary | ICD-10-CM

## 2014-04-11 DIAGNOSIS — I5022 Chronic systolic (congestive) heart failure: Secondary | ICD-10-CM

## 2014-04-11 DIAGNOSIS — I428 Other cardiomyopathies: Secondary | ICD-10-CM

## 2014-04-11 DIAGNOSIS — Z4502 Encounter for adjustment and management of automatic implantable cardiac defibrillator: Secondary | ICD-10-CM

## 2014-04-11 DIAGNOSIS — I493 Ventricular premature depolarization: Secondary | ICD-10-CM

## 2014-04-11 LAB — MDC_IDC_ENUM_SESS_TYPE_INCLINIC
Battery Remaining Longevity: 137 mo
Battery Voltage: 3.14 V
Brady Statistic RV Percent Paced: 0.01 %
Date Time Interrogation Session: 20151020153337
HighPow Impedance: 228 Ohm
HighPow Impedance: 68 Ohm
Lead Channel Impedance Value: 703 Ohm
Lead Channel Pacing Threshold Amplitude: 0.625 V
Lead Channel Pacing Threshold Pulse Width: 0.4 ms
Lead Channel Sensing Intrinsic Amplitude: 10.625 mV
Lead Channel Sensing Intrinsic Amplitude: 9.875 mV
Lead Channel Setting Pacing Amplitude: 2.5 V
Lead Channel Setting Pacing Pulse Width: 0.4 ms
Lead Channel Setting Sensing Sensitivity: 0.3 mV
Zone Setting Detection Interval: 250 ms
Zone Setting Detection Interval: 300 ms
Zone Setting Detection Interval: 450 ms

## 2014-04-11 NOTE — Patient Instructions (Addendum)

## 2014-04-11 NOTE — Progress Notes (Signed)
Patient Care Team: [name], MD as PCP - General (Family Medicine)   HPI  [name] is a 43 y.o. female Seen in followup for defibrillator implanted for nonischemic myopathy   She underwent replacement of the generator with new RV lead replacing [id number] 7/15  Because on going problems with heart failure at her last visit she requested referral to the heart failure clinic. She has seen Dr. DM. The history of PVCs she underwent Holter monitoring demonstrating less than 1% of PVCs.  Since discharge she has been feeling some better.   ECHO just recently was about 30%     Past Medical History  Diagnosis Date  . Hypertension   . Nonischemic cardiomyopathy   . Sinus tachycardia   . Nocturnal dyspnea   . Chronic CHF (congestive heart failure)     a. EF 15-20% b. RHC (09/2013) RA 14, RV 57/22, PA 64/36 (48), PCWP 18, FIck CO/CI 3.7/1.6, PVR 8.1 WU, PA sat 47%   . Morbid obesity   . Snoring-prob OSA 09/04/2011  . Sprint Fidelis ICD lead RECALL  [id number]   . Automatic implantable cardioverter-defibrillator in situ   . Myocardial infarction 08/2013  . Hypothyroidism   . History of stomach ulcers     Past Surgical History  Procedure Laterality Date  . Cardiac defibrillator placement  2006; 12/26/2013    Medtronic [id number]; 12-2013 ICD gen change and RV lead revision with new 6935 RV lead by Dr [name]  . Cesarean section  1999  . Tubal ligation  1999  . Cardiac catheterization  ~ 2006; 09/2013    Current Outpatient Prescriptions  Medication Sig Dispense Refill  . acetaminophen (TYLENOL) 325 MG tablet Take 325 mg by mouth daily as needed for headache.       Marland Kitchen aspirin 81 MG tablet Take 81 mg by mouth daily.      . carvedilol (COREG) 12.5 MG tablet Take 1 tablet (12.5 mg total) by mouth 2 (two) times daily with a meal.  60 tablet  4  . DIGOX 125 MCG tablet TAKE ONE TABLET BY MOUTH ONE TIME DAILY   30 tablet  3  . digoxin (LANOXIN) 0.125 MG tablet Take 0.125 mg by mouth  daily.      . furosemide (LASIX) 40 MG tablet Take 80 mg by mouth daily.      Marland Kitchen levothyroxine (SYNTHROID, LEVOTHROID) 88 MCG tablet Take 88 mcg by mouth daily before breakfast.      . lisinopril (PRINIVIL,ZESTRIL) 10 MG tablet Take 1 tablet (10 mg total) by mouth 2 (two) times daily.      Marland Kitchen spironolactone (ALDACTONE) 25 MG tablet Take 25 mg by mouth daily.       No current facility-administered medications for this visit.    No Known Allergies  Review of Systems negative except from HPI and PMH  Physical Exam There were no vitals taken for this visit. Well developed and well nourished in no acute distress HENT normal E scleral and icterus clear Neck Supple JVP canbnnot disecern carotids brisk and full Clear to ausculation  Regular rate and rhythm, Soft with active bowel sounds No clubbing cyanosis Trace Edema Alert and oriented, grossly normal motor and sensory function Skin Warm and Dry  ECG demonstrates sinus rhythm at 68 Intervals 15/10/40 Access left -21 Frequent PVCs left bundle inferior axis transition V3    Assessment and  Plan  Nonischemic cardiac myopathy  Systolic heart failure-chronic    Implantable defibrillator-Medtronic  The patient's device was interrogated and the information was fully reviewed.  The device was reprogrammed to  Maximize longevity  6949-lead replaced   PVCs   MR/TR    She is doing bett. She appreciates the care  from the heart failure clinic.  Interestingly, her PVC burden by Holter was only 1% compared to 25% on her ECG. We'll need to follow this along.   She is euvolemic we'll continue her on her current medications.   Device function was normal and was reprogrammed

## 2014-04-13 ENCOUNTER — Ambulatory Visit (HOSPITAL_COMMUNITY)
Admission: RE | Admit: 2014-04-13 | Discharge: 2014-04-13 | Disposition: A | Discharge status: Home / Self Care | Payer: BC Managed Care – PPO | Source: Ambulatory Visit | Attending: Internal Medicine | Admitting: Internal Medicine

## 2014-04-13 ENCOUNTER — Encounter (HOSPITAL_COMMUNITY): Payer: Self-pay

## 2014-04-13 VITALS — BP 100/60 | HR 84 | Wt 272.0 lb

## 2014-04-13 DIAGNOSIS — I493 Ventricular premature depolarization: Secondary | ICD-10-CM | POA: Insufficient documentation

## 2014-04-13 DIAGNOSIS — Z95810 Presence of automatic (implantable) cardiac defibrillator: Secondary | ICD-10-CM | POA: Diagnosis not present

## 2014-04-13 DIAGNOSIS — I272 Other secondary pulmonary hypertension: Secondary | ICD-10-CM | POA: Insufficient documentation

## 2014-04-13 DIAGNOSIS — I10 Essential (primary) hypertension: Secondary | ICD-10-CM | POA: Diagnosis not present

## 2014-04-13 DIAGNOSIS — I429 Cardiomyopathy, unspecified: Secondary | ICD-10-CM | POA: Diagnosis present

## 2014-04-13 DIAGNOSIS — Z7982 Long term (current) use of aspirin: Secondary | ICD-10-CM | POA: Diagnosis not present

## 2014-04-13 DIAGNOSIS — I252 Old myocardial infarction: Secondary | ICD-10-CM | POA: Insufficient documentation

## 2014-04-13 DIAGNOSIS — E039 Hypothyroidism, unspecified: Secondary | ICD-10-CM | POA: Insufficient documentation

## 2014-04-13 DIAGNOSIS — Z79899 Other long term (current) drug therapy: Secondary | ICD-10-CM | POA: Diagnosis not present

## 2014-04-13 DIAGNOSIS — I5022 Chronic systolic (congestive) heart failure: Secondary | ICD-10-CM | POA: Diagnosis present

## 2014-04-13 DIAGNOSIS — G4733 Obstructive sleep apnea (adult) (pediatric): Secondary | ICD-10-CM | POA: Diagnosis not present

## 2014-04-13 DIAGNOSIS — I270 Primary pulmonary hypertension: Secondary | ICD-10-CM

## 2014-04-13 LAB — BASIC METABOLIC PANEL
Anion gap: 11 (ref 5–15)
BUN: 13 mg/dL (ref 6–23)
CO2: 27 mEq/L (ref 19–32)
Calcium: 9.6 mg/dL (ref 8.4–10.5)
Chloride: 100 mEq/L (ref 96–112)
Creatinine, Ser: 0.93 mg/dL (ref 0.50–1.10)
GFR calc Af Amer: 86 mL/min — ABNORMAL LOW (ref >90)
GFR calc non Af Amer: 74 mL/min — ABNORMAL LOW (ref >90)
Glucose, Bld: 87 mg/dL (ref 70–99)
Potassium: 4 mEq/L (ref 3.7–5.3)
Sodium: 138 mEq/L (ref 137–147)

## 2014-04-13 LAB — PRO B NATRIURETIC PEPTIDE: Pro B Natriuretic peptide (BNP): 128.6 pg/mL — ABNORMAL HIGH (ref 0–125)

## 2014-04-13 MED ORDER — SACUBITRIL-VALSARTAN 24-26 MG PO TABS: 24.0000 mg | ORAL_TABLET | Freq: Two times a day (BID) | ORAL | Status: DC

## 2014-04-13 NOTE — Patient Instructions (Signed)
Stop Lisinopril and after 36 hours begin Entresto 24/26 twice daily You have been referred to outpatient cardiac rehab Follow up with [facility] in 1 month

## 2014-04-13 NOTE — Progress Notes (Signed)
Patient ID: [name], female   DOB: 1970-07-23, 43 y.o.   MRN: [id number] Primary Cardiologist: Dr [name]  PCP: [facility] in [place]  HPI: [name] is a 43 y.o. 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  She was followed for systolic CHF initially in [place]. Apparently her EF recovered to 35-40%. However, she was admitted to [facility] 09/19/13 for worsening dyspnea and CP. Coronaries were reportedly OK on LHC at that time at [facility] Echo showed EF of 15-20% with four-chamber enlargement with moderate-severe TR/MR. RHC as below. Rheumatological w/u negative  RHC 09/24/13  RA 14  RV 57/22  PA 64/36 (48)  PCWP 18  Fick CO/CI: 3.7/1.6  PVR 8.1 WU  Ao sat 95%  PA sat 47%   CPX  11/09/13  FEV1 2.6L (85%)  FVC 2.1L (86%)  FEV1/FVC 86%  pVO2 14.8 (82%)  Slope 31.7%  RER 1.11  Ve/MVV 86%   Echo (8/15) with EF 20%, moderate LV dilation, diffuse hypokinesis, normal RV size with mildly decreased systolic function, mild MR.   Sleep study did not show significant OSA.   RHC (8/15): Mean RA 3 PA 33/11 mean 19 Mean PCWP 8 CI 2.35  Holter (8/15) with < 1% PVCs  Patient returns today after [place] in 8/15.  This looked considerably better than the 4/15 RHC with normal filling pressures and acceptable cardiac output.  She has been stable overall. She is short of breath after walking about 1/2 mile. She can walk up a flight of steps without problems.  She still sleeps with the head of her bed inclined.     Labs: (11/09/13): Dig level 0.5, K 3.6, Creatinine 0.83, pro-BNP 622 (12/19/13): K 3.6, creatinine 0.8 (7/15): K 4.1, creatinine 0.91 (8/15): K 3.9, creatinine 0.91  ECG: NSR, nonspecific T wave flattening, LVH, PVCs  SH: Disabled since 2014. Lives her husband and 2 children. Does not drink alcohol or smoke   FH: Grandmother had heart failure   ROS: All systems negative except as listed in HPI,  PMH and Problem List.  Past Medical History  Diagnosis Date  . Hypertension   . Nonischemic cardiomyopathy   . Sinus tachycardia   . Nocturnal dyspnea   . Chronic CHF (congestive heart failure)     a. EF 15-20% b. RHC (09/2013) RA 14, RV 57/22, PA 64/36 (48), PCWP 18, FIck CO/CI 3.7/1.6, PVR 8.1 WU, PA sat 47%   . Morbid obesity   . Snoring-prob OSA 09/04/2011  . Sprint Fidelis ICD lead RECALL  [id number]   . Automatic implantable cardioverter-defibrillator in situ   . Myocardial infarction 08/2013  . Hypothyroidism   . History of stomach ulcers     Current Outpatient Prescriptions  Medication Sig Dispense Refill  . aspirin 81 MG tablet Take 81 mg by mouth daily.      . carvedilol (COREG) 12.5 MG tablet Take 1 tablet (12.5 mg total) by mouth 2 (two) times daily with a meal.  60 tablet  4  . digoxin (LANOXIN) 0.125 MG tablet Take 0.125 mg by mouth daily.      . furosemide (LASIX) 40 MG tablet Take 80 mg by mouth daily.      Marland Kitchen levothyroxine (SYNTHROID, LEVOTHROID) 88 MCG tablet Take 88 mcg by mouth daily before breakfast.      . spironolactone (ALDACTONE) 25 MG tablet Take 25 mg by mouth daily.      . Sacubitril-Valsartan (  ENTRESTO) 24-26 MG TABS Take 24-26 mg by mouth 2 (two) times daily.  60 tablet  3   No current facility-administered medications for this encounter.    Filed Vitals:   04/13/14 1046  BP: 100/60  Pulse: 84  Weight: 272 lb (123.378 kg)  SpO2: 96%    PHYSICAL EXAM: General: Well appearing. No respiratory difficulty. Obese.  HEENT: normal  Neck: supple. JVP not elevated. Carotids 2+ bilat; no bruits. No lymphadenopathy. Goiter  Cor: PMI nondisplaced. Regular rate & rhythm. No rubs, gallops or murmurs.  No edema.  Lungs: clear  Abdomen: Soft, nontender, nondistended. No hepatosplenomegaly. No bruits or masses. Good bowel sounds.  Extremities: no cyanosis, clubbing, rash. Neuro: alert & oriented x 3, cranial nerves grossly intact. moves all 4 extremities w/o  difficulty. Affect pleasant.   ASSESSMENT & PLAN:  1. Chronic systolic HF: Nonischemic cardiomyopathy.  8/15 echo showed EF 20% with diffuse hypokinesis and moderate LV dilation, mild MR.  Cause for cardiomyopathy is uncertain: Initially noted peri-partum (after son's birth), so cannot rule out peri-partum CMP.  She was noted at one point to have frequent PVCs but holter showed <1% PVCs. Currently, stable NYHA class II symptoms and does not appear volume overloaded.  RHC in 8/15 showed normal filling pressures and preserved cardiac output.  Medtronic ICD, does not qualify for CRT with narrow QRS.  - Continue current Coreg, spironolactone, Lasix and digoxin.  - Stop lisinopril, start Entresto 24/26 bid.   - Check BMET/digoxin level today.  - Followup in 1 month to check labs and titrate Entresto.  2. Pulmonary HTN: Mixed picture with PVR 8.1 on RHC at [facility]  However, repeat study in 8/15 here showed no significant pulmonary hypertension.  3. Morbid obesity: Patient needs to lose weight and understands this.  She is interested in the bariatric surgery program at [facility]  We will provide the number. 4. OSA:  Surprisingly, no significant OSA on sleep study.  5. PVCs: Holter monitor showed < 1% PVCs.  Suspect that this does not contribute to the cardiomyopathy significantly.    [name] 04/13/2014

## 2014-04-24 ENCOUNTER — Other Ambulatory Visit: Payer: Self-pay | Admitting: Internal Medicine

## 2014-04-26 DIAGNOSIS — E039 Hypothyroidism, unspecified: Secondary | ICD-10-CM | POA: Insufficient documentation

## 2014-05-04 ENCOUNTER — Telehealth (HOSPITAL_COMMUNITY): Payer: Self-pay | Admitting: *Deleted

## 2014-05-16 ENCOUNTER — Encounter (HOSPITAL_COMMUNITY): Payer: BC Managed Care – PPO

## 2014-05-26 ENCOUNTER — Ambulatory Visit: Payer: Self-pay | Admitting: Family Medicine

## 2014-05-26 ENCOUNTER — Telehealth (HOSPITAL_COMMUNITY): Payer: Self-pay | Admitting: *Deleted

## 2014-05-26 NOTE — Telephone Encounter (Signed)
Received notification from [place] pt's [name] was approved from 05/24/14-06/22/2038, ref # [id number]

## 2014-05-29 ENCOUNTER — Telehealth (HOSPITAL_COMMUNITY): Payer: Self-pay | Admitting: Cardiology

## 2014-05-29 NOTE — Telephone Encounter (Signed)
Auth# [id number]

## 2014-05-29 NOTE — Telephone Encounter (Signed)
[employer] called to re port, entresto approved 12/2-[facility] aware and pt picked up rx on 12/5

## 2014-05-31 ENCOUNTER — Ambulatory Visit (HOSPITAL_COMMUNITY)
Admission: RE | Admit: 2014-05-31 | Discharge: 2014-05-31 | Disposition: A | Discharge status: Home / Self Care | Payer: BC Managed Care – PPO | Source: Ambulatory Visit | Attending: Cardiology | Admitting: Cardiology

## 2014-05-31 VITALS — BP 98/68 | HR 49 | Wt 277.8 lb

## 2014-05-31 DIAGNOSIS — I5020 Unspecified systolic (congestive) heart failure: Secondary | ICD-10-CM | POA: Diagnosis present

## 2014-05-31 DIAGNOSIS — I5022 Chronic systolic (congestive) heart failure: Secondary | ICD-10-CM | POA: Diagnosis not present

## 2014-05-31 DIAGNOSIS — Z95810 Presence of automatic (implantable) cardiac defibrillator: Secondary | ICD-10-CM | POA: Diagnosis not present

## 2014-05-31 DIAGNOSIS — E039 Hypothyroidism, unspecified: Secondary | ICD-10-CM | POA: Diagnosis not present

## 2014-05-31 DIAGNOSIS — I429 Cardiomyopathy, unspecified: Secondary | ICD-10-CM | POA: Diagnosis not present

## 2014-05-31 DIAGNOSIS — I272 Other secondary pulmonary hypertension: Secondary | ICD-10-CM | POA: Insufficient documentation

## 2014-05-31 DIAGNOSIS — F519 Sleep disorder not due to a substance or known physiological condition, unspecified: Secondary | ICD-10-CM | POA: Diagnosis not present

## 2014-05-31 DIAGNOSIS — I252 Old myocardial infarction: Secondary | ICD-10-CM | POA: Insufficient documentation

## 2014-05-31 DIAGNOSIS — G4733 Obstructive sleep apnea (adult) (pediatric): Secondary | ICD-10-CM | POA: Diagnosis not present

## 2014-05-31 DIAGNOSIS — I270 Primary pulmonary hypertension: Secondary | ICD-10-CM | POA: Diagnosis not present

## 2014-05-31 DIAGNOSIS — E669 Obesity, unspecified: Secondary | ICD-10-CM

## 2014-05-31 DIAGNOSIS — Z79899 Other long term (current) drug therapy: Secondary | ICD-10-CM | POA: Diagnosis not present

## 2014-05-31 DIAGNOSIS — Z7982 Long term (current) use of aspirin: Secondary | ICD-10-CM | POA: Diagnosis not present

## 2014-05-31 DIAGNOSIS — I493 Ventricular premature depolarization: Secondary | ICD-10-CM | POA: Insufficient documentation

## 2014-05-31 LAB — BASIC METABOLIC PANEL
Anion gap: 10 (ref 5–15)
BUN: 10 mg/dL (ref 6–23)
CO2: 27 mEq/L (ref 19–32)
Calcium: 9.4 mg/dL (ref 8.4–10.5)
Chloride: 105 mEq/L (ref 96–112)
Creatinine, Ser: 0.9 mg/dL (ref 0.50–1.10)
GFR calc Af Amer: 89 mL/min — ABNORMAL LOW (ref >90)
GFR calc non Af Amer: 77 mL/min — ABNORMAL LOW (ref >90)
Glucose, Bld: 100 mg/dL — ABNORMAL HIGH (ref 70–99)
Potassium: 4 mEq/L (ref 3.7–5.3)
Sodium: 142 mEq/L (ref 137–147)

## 2014-05-31 LAB — DIGOXIN LEVEL: Digoxin Level: 0.4 ng/mL — ABNORMAL LOW (ref 0.8–2.0)

## 2014-05-31 NOTE — Patient Instructions (Addendum)
Take an extra 40 mg of Furosemide (Lasix) for 2 days only  Labs today  Your physician recommends that you schedule a follow-up appointment in: 2 months

## 2014-05-31 NOTE — Progress Notes (Signed)
Patient ID: AMARIAH KIERSTEAD, female   DOB: 11-11-1970, 43 y.o.   MRN: [id number] Primary Cardiologist: Dr [name]  PCP: [facility] in [place]  HPI: ALICE BURNSIDE is a 44 y.o. 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  She was followed for systolic CHF initially in [place]. Apparently her EF recovered to 35-40%. However, she was admitted to [facility] 09/19/13 for worsening dyspnea and CP. Coronaries were reportedly OK on LHC at that time at [facility]. Echo showed EF of 15-20% with four-chamber enlargement with moderate-severe TR/MR. RHC as below. Rheumatological w/u negative  RHC 09/24/13  RA 14  RV 57/22  PA 64/36 (48)  PCWP 18  Fick CO/CI: 3.7/1.6  PVR 8.1 WU  Ao sat 95%  PA sat 47%   CPX  11/09/13  FEV1 2.6L (85%)  FVC 2.1L (86%)  FEV1/FVC 86%  pVO2 14.8 (82%)  Slope 31.7%  RER 1.11  Ve/MVV 86%   Echo (8/15) with EF 20%, moderate LV dilation, diffuse hypokinesis, normal RV size with mildly decreased systolic function, mild MR.   Sleep study did not show significant OSA.   RHC (8/15): Mean RA 3 PA 33/11 mean 19 Mean PCWP 8 CI 2.35  Holter (8/15) with < 1% PVCs  She has been stable overall. She is short of breath after walking about 1/2 mile. She can walk up a flight of steps without problems.  She can go grocery shopping without trouble.  She still sleeps with the head of her bed inclined.  No lightheadedness or syncope.  No palpitations. Weight is up 5 lbs.   Optivol was checked today.  Fluid index was less than threshold but approaching threshold. However, impedance had begun to trend upwards.   Labs: (11/09/13): Dig level 0.5, K 3.6, Creatinine 0.83, pro-BNP 622 (12/19/13): K 3.6, creatinine 0.8 (7/15): K 4.1, creatinine 0.91 (8/15): K 3.9, creatinine 0.91 (10/15): K 4, creatinine 0.93, proBNP 129  ECG: NSR, nonspecific T wave flattening, LVH, PVCs  SH: Disabled since 2014. Lives her  husband and 2 children. Does not drink alcohol or smoke   FH: Grandmother had heart failure   ROS: All systems negative except as listed in HPI, PMH and Problem List.  Past Medical History  Diagnosis Date  . Hypertension   . Nonischemic cardiomyopathy   . Sinus tachycardia   . Nocturnal dyspnea   . Chronic CHF (congestive heart failure)     a. EF 15-20% b. RHC (09/2013) RA 14, RV 57/22, PA 64/36 (48), PCWP 18, FIck CO/CI 3.7/1.6, PVR 8.1 WU, PA sat 47%   . Morbid obesity   . Snoring-prob OSA 09/04/2011  . Sprint Fidelis ICD lead RECALL  [id number]   . Automatic implantable cardioverter-defibrillator in situ   . Myocardial infarction 08/2013  . Hypothyroidism   . History of stomach ulcers     Current Outpatient Prescriptions  Medication Sig Dispense Refill  . aspirin 81 MG tablet Take 81 mg by mouth daily.    Marland Kitchen atorvastatin (LIPITOR) 20 MG tablet Take 20 mg by mouth daily.    . carvedilol (COREG) 12.5 MG tablet Take 1 tablet (12.5 mg total) by mouth 2 (two) times daily with a meal. 60 tablet 4  . digoxin (LANOXIN) 0.125 MG tablet Take 0.125 mg by mouth daily.    . furosemide (LASIX) 40 MG tablet Take 80 mg by mouth daily.    Marland Kitchen levothyroxine (SYNTHROID, LEVOTHROID) 88 MCG tablet Take  88 mcg by mouth daily before breakfast.    . Sacubitril-Valsartan (ENTRESTO) 24-26 MG TABS Take 24-26 mg by mouth 2 (two) times daily. 60 tablet 3  . spironolactone (ALDACTONE) 25 MG tablet TAKE ONE TABLET BY MOUTH ONE TIME DAILY  30 tablet 4   No current facility-administered medications for this encounter.    Filed Vitals:   05/31/14 1035  BP: 98/68  Pulse: 49  Weight: 277 lb 12 oz (125.987 kg)  SpO2: 100%    PHYSICAL EXAM: General: Well appearing. No respiratory difficulty. Obese.  HEENT: normal  Neck: supple. JVP not elevated. Carotids 2+ bilat; no bruits. No lymphadenopathy. Thyroid enlarged.   Cor: PMI nondisplaced. Regular rate & rhythm. No rubs, gallops or murmurs.  No edema.  Lungs: clear   Abdomen: Soft, nontender, nondistended. No hepatosplenomegaly. No bruits or masses. Good bowel sounds.  Extremities: no cyanosis, clubbing, rash. Neuro: alert & oriented x 3, cranial nerves grossly intact. moves all 4 extremities w/o difficulty. Affect pleasant.   ASSESSMENT & PLAN:  1. Chronic systolic HF: Nonischemic cardiomyopathy.  8/15 echo showed EF 20% with diffuse hypokinesis and moderate LV dilation, mild MR.  Cause for cardiomyopathy is uncertain: initially noted peri-partum (after son's birth), so cannot rule out peri-partum CMP.  She was noted at one point to have frequent PVCs but holter showed <1% PVCs. Currently, stable NYHA class II symptoms and does not appear volume overloaded on exam, though Optivol shows fluid index approaching threshold (impedance, however, has started trending up).  RHC in 8/15 showed normal filling pressures and preserved cardiac output.  She does not qualify for CRT with narrow QRS.  - Continue current Coreg, spironolactone, Lasix, digoxin, and Entresto.  I will not titrate up Coreg or Entresto today with SBP in 90s.  - Check BMET/digoxin level today.  - I will have her take an extra 40 mg Lasix in the afternoon for 2 days based on Optivol findings.   2. Pulmonary HTN: Mixed picture with PVR 8.1 on RHC at [facility].  However, repeat study in 8/15 here showed no significant pulmonary hypertension.  3. Morbid obesity: Patient needs to lose weight and understands this.  She needs to increase exercise and watch her diet better. She is interested in cardiac rehab, I will refer her.  4. OSA:  Surprisingly, no significant OSA on sleep study.  5. PVCs: Holter monitor showed < 1% PVCs.  Suspect that this does not contribute to the cardiomyopathy significantly.    [name] 05/31/2014

## 2014-06-01 ENCOUNTER — Encounter (HOSPITAL_COMMUNITY): Payer: Self-pay | Admitting: Internal Medicine

## 2014-06-06 ENCOUNTER — Telehealth (HOSPITAL_COMMUNITY): Payer: Self-pay | Admitting: *Deleted

## 2014-07-04 ENCOUNTER — Encounter: Payer: Self-pay | Admitting: Cardiology

## 2014-07-12 ENCOUNTER — Other Ambulatory Visit (HOSPITAL_COMMUNITY): Payer: Self-pay | Admitting: Cardiology

## 2014-07-12 MED ORDER — FUROSEMIDE 40 MG PO TABS: 80.0000 mg | ORAL_TABLET | Freq: Every day | ORAL | Status: DC

## 2014-07-13 ENCOUNTER — Ambulatory Visit (INDEPENDENT_AMBULATORY_CARE_PROVIDER_SITE_OTHER): Payer: BLUE CROSS/BLUE SHIELD | Admitting: *Deleted

## 2014-07-13 ENCOUNTER — Telehealth: Payer: Self-pay | Admitting: Cardiology

## 2014-07-13 DIAGNOSIS — I429 Cardiomyopathy, unspecified: Secondary | ICD-10-CM | POA: Diagnosis not present

## 2014-07-13 DIAGNOSIS — I5022 Chronic systolic (congestive) heart failure: Secondary | ICD-10-CM | POA: Diagnosis not present

## 2014-07-13 DIAGNOSIS — I428 Other cardiomyopathies: Secondary | ICD-10-CM

## 2014-07-13 LAB — MDC_IDC_ENUM_SESS_TYPE_REMOTE
Battery Remaining Longevity: 135 mo
Battery Voltage: 3.1 V
Brady Statistic RV Percent Paced: 0.01 %
Date Time Interrogation Session: 20160121202905
HighPow Impedance: 73 Ohm
Lead Channel Impedance Value: 513 Ohm
Lead Channel Impedance Value: 665 Ohm
Lead Channel Pacing Threshold Amplitude: 0.625 V
Lead Channel Pacing Threshold Pulse Width: 0.4 ms
Lead Channel Sensing Intrinsic Amplitude: 10.125 mV
Lead Channel Sensing Intrinsic Amplitude: 10.125 mV
Lead Channel Setting Pacing Amplitude: 2 V
Lead Channel Setting Pacing Pulse Width: 0.4 ms
Lead Channel Setting Sensing Sensitivity: 0.3 mV
Zone Setting Detection Interval: 250 ms
Zone Setting Detection Interval: 300 ms
Zone Setting Detection Interval: 450 ms

## 2014-07-13 NOTE — Telephone Encounter (Signed)
LMOVM reminding pt to send remote transmission.   

## 2014-07-13 NOTE — Progress Notes (Signed)
Remote ICD transmission.   

## 2014-07-31 ENCOUNTER — Encounter: Payer: Self-pay | Admitting: Cardiology

## 2014-08-03 ENCOUNTER — Encounter: Payer: Self-pay | Admitting: Internal Medicine

## 2014-08-15 ENCOUNTER — Ambulatory Visit (HOSPITAL_COMMUNITY)
Admission: RE | Admit: 2014-08-15 | Discharge: 2014-08-15 | Disposition: A | Discharge status: Home / Self Care | Payer: BC Managed Care – PPO | Source: Ambulatory Visit | Attending: Internal Medicine | Admitting: Internal Medicine

## 2014-08-15 ENCOUNTER — Encounter (HOSPITAL_COMMUNITY): Payer: Self-pay

## 2014-08-15 VITALS — BP 90/0 | HR 79 | Resp 18 | Wt 280.2 lb

## 2014-08-15 DIAGNOSIS — G4733 Obstructive sleep apnea (adult) (pediatric): Secondary | ICD-10-CM | POA: Insufficient documentation

## 2014-08-15 DIAGNOSIS — I272 Other secondary pulmonary hypertension: Secondary | ICD-10-CM | POA: Insufficient documentation

## 2014-08-15 DIAGNOSIS — I270 Primary pulmonary hypertension: Secondary | ICD-10-CM | POA: Diagnosis not present

## 2014-08-15 DIAGNOSIS — I5022 Chronic systolic (congestive) heart failure: Secondary | ICD-10-CM

## 2014-08-15 DIAGNOSIS — I493 Ventricular premature depolarization: Secondary | ICD-10-CM | POA: Insufficient documentation

## 2014-08-15 LAB — BASIC METABOLIC PANEL
Anion gap: 6 (ref 5–15)
BUN: 10 mg/dL (ref 6–23)
CO2: 32 mmol/L (ref 19–32)
Calcium: 9.3 mg/dL (ref 8.4–10.5)
Chloride: 101 mmol/L (ref 96–112)
Creatinine, Ser: 0.97 mg/dL (ref 0.50–1.10)
GFR calc Af Amer: 82 mL/min — ABNORMAL LOW (ref >90)
GFR calc non Af Amer: 71 mL/min — ABNORMAL LOW (ref >90)
Glucose, Bld: 105 mg/dL — ABNORMAL HIGH (ref 70–99)
Potassium: 3.4 mmol/L — ABNORMAL LOW (ref 3.5–5.1)
Sodium: 139 mmol/L (ref 135–145)

## 2014-08-15 LAB — BRAIN NATRIURETIC PEPTIDE: B Natriuretic Peptide: 60.5 pg/mL (ref 0.0–100.0)

## 2014-08-15 LAB — DIGOXIN LEVEL: Digoxin Level: 0.5 ng/mL — ABNORMAL LOW (ref 0.8–2.0)

## 2014-08-15 NOTE — Progress Notes (Signed)
Patient ID: [name], female   DOB: 1970-08-12, 44 y.o.   MRN: [id number]  PCP: [facility] in [place]  HPI: [name] is a 44 y.o. 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  She was followed for systolic CHF initially in [place]. Apparently her EF recovered to 35-40%. However, she was admitted to [facility] 09/19/13 for worsening dyspnea and CP. Coronaries were reportedly OK on LHC at that time at [facility]. Echo showed EF of 15-20% with four-chamber enlargement with moderate-severe TR/MR. RHC as below. Rheumatological w/u negative  RHC 09/24/13  RA 14  RV 57/22  PA 64/36 (48)  PCWP 18  Fick CO/CI: 3.7/1.6  PVR 8.1 WU  Ao sat 95%  PA sat 47%   CPX  11/09/13  FEV1 2.6L (85%)  FVC 2.1L (86%)  FEV1/FVC 86%  pVO2 14.8 (82%)  Slope 31.7%  RER 1.11  Ve/MVV 86%   Echo (8/15) with EF 20%, moderate LV dilation, diffuse hypokinesis, normal RV size with mildly decreased systolic function, mild MR.   Sleep study did not show significant OSA.   RHC (8/15): Mean RA 3 PA 33/11 mean 19 Mean PCWP 8 CI 2.35  Holter (8/15) with < 1% PVCs  She has been doing well.  She is walking more, no exertional dyspnea on flat ground.  No lightheadedness though BP is running low today.  This is unusual for her.  Overall, she feels better than in the past.  She is going to start cardiac rehab and has been going to seminars about bariatric surgery.  Weight is up 3 lbs.    Optivol was checked today.  Fluid index was less than threshold with stable impedance.  1.5 hrs activity/day.   Labs: (11/09/13): Dig level 0.5, K 3.6, Creatinine 0.83, pro-BNP 622 (12/19/13): K 3.6, creatinine 0.8 (7/15): K 4.1, creatinine 0.91 (8/15): K 3.9, creatinine 0.91 (10/15): K 4, creatinine 0.93, proBNP 129 (12/15): K 4, creatinine 0.9, digoxin 0.4  SH: Disabled since 2014. Lives her husband and 2 children. Does not drink alcohol or smoke    FH: Grandmother had heart failure   ROS: All systems negative except as listed in HPI, PMH and Problem List.  Past Medical History  Diagnosis Date  . Hypertension   . Nonischemic cardiomyopathy   . Sinus tachycardia   . Nocturnal dyspnea   . Chronic CHF (congestive heart failure)     a. EF 15-20% b. RHC (09/2013) RA 14, RV 57/22, PA 64/36 (48), PCWP 18, FIck CO/CI 3.7/1.6, PVR 8.1 WU, PA sat 47%   . Morbid obesity   . Snoring-prob OSA 09/04/2011  . Sprint Fidelis ICD lead RECALL  [id number]   . Automatic implantable cardioverter-defibrillator in situ   . Myocardial infarction 08/2013  . Hypothyroidism   . History of stomach ulcers     Current Outpatient Prescriptions  Medication Sig Dispense Refill  . aspirin 81 MG tablet Take 81 mg by mouth daily.    Marland Kitchen atorvastatin (LIPITOR) 20 MG tablet Take 20 mg by mouth daily.    . carvedilol (COREG) 12.5 MG tablet Take 1 tablet (12.5 mg total) by mouth 2 (two) times daily with a meal. 60 tablet 4  . digoxin (LANOXIN) 0.125 MG tablet Take 0.125 mg by mouth daily.    . furosemide (LASIX) 40 MG tablet Take 2 tablets (80 mg total) by mouth daily. 60 tablet 3  . levothyroxine (SYNTHROID, LEVOTHROID) 88 MCG  tablet Take 88 mcg by mouth daily before breakfast.    . Sacubitril-Valsartan (ENTRESTO) 24-26 MG TABS Take 24-26 mg by mouth 2 (two) times daily. 60 tablet 3  . spironolactone (ALDACTONE) 25 MG tablet TAKE ONE TABLET BY MOUTH ONE TIME DAILY  30 tablet 4   No current facility-administered medications for this encounter.    Filed Vitals:   08/15/14 1056  BP: 90/0  Pulse: 79  Resp: 18  Weight: 280 lb 4 oz (127.121 kg)  SpO2: 97%    PHYSICAL EXAM: General: Well appearing. No respiratory difficulty. Obese.  HEENT: normal  Neck: supple. JVP not elevated. Carotids 2+ bilat; no bruits. No lymphadenopathy. Thyroid enlarged.   Cor: PMI nondisplaced. Regular rate & rhythm. No rubs, gallops or murmurs.  No edema.  Lungs: clear  Abdomen: Soft,  nontender, nondistended. No hepatosplenomegaly. No bruits or masses. Good bowel sounds.  Extremities: no cyanosis, clubbing, rash. Neuro: alert & oriented x 3, cranial nerves grossly intact. moves all 4 extremities w/o difficulty. Affect pleasant.  ASSESSMENT & PLAN:  1. Chronic systolic HF: Nonischemic cardiomyopathy.  8/15 echo showed EF 20% with diffuse hypokinesis and moderate LV dilation, mild MR.  Cause for cardiomyopathy is uncertain: initially noted peri-partum (after son's birth), so cannot rule out peri-partum CMP.  She was noted at one point to have frequent PVCs but holter showed <1% PVCs. Currently, stable NYHA class II symptoms and does not appear volume overloaded on exam.  This was confirmed by Optivol.  RHC in 8/15 showed normal filling pressures and preserved cardiac output.  She does not qualify for CRT with narrow QRS.  - Continue current Coreg, spironolactone, Lasix, digoxin, and Entresto.  I will not titrate up Coreg or Entresto today with SBP in 90s.  - Check BMET/BNP/digoxin level today.  - Repeat CPX in 5/16.  2. Pulmonary HTN: Mixed picture with PVR 8.1 on RHC at [facility]  However, repeat study in 8/15 here showed no significant pulmonary hypertension.  3. Morbid obesity: Patient needs to lose weight and understands this.  She needs to increase exercise and watch her diet better. She is going to start cardiac rehab.  She has gone to seminars about bariatric surgery.  If she remains stable, this would not be out of the question.  4. OSA:  Surprisingly, no significant OSA on sleep study.  5. PVCs: Holter monitor showed < 1% PVCs.  Suspect that this does not contribute to the cardiomyopathy significantly.   Followup in 3 months.    [name] 08/15/2014

## 2014-08-15 NOTE — Patient Instructions (Signed)
Labs today.  Your physician has recommended that you have a cardiopulmonary stress test (CPX). CPX testing is a non-invasive measurement of heart and lung function. It replaces a traditional treadmill stress test. This type of test provides a tremendous amount of information that relates not only to your present condition but also for future outcomes. This test combines measurements of you ventilation, respiratory gas exchange in the lungs, electrocardiogram (EKG), blood pressure and physical response before, during, and following an exercise protocol.  Your physician recommends that you schedule a follow-up appointment in: 3 months  Do the following things EVERYDAY: 1) Weigh yourself in the morning before breakfast. Write it down and keep it in a log. 2) Take your medicines as prescribed 3) Eat low salt foods-Limit salt (sodium) to 2000 mg per day.  4) Stay as active as you can everyday 5) Limit all fluids for the day to less than 2 liters 6)

## 2014-08-17 ENCOUNTER — Encounter (HOSPITAL_COMMUNITY)
Admission: RE | Admit: 2014-08-17 | Discharge: 2014-08-17 | Disposition: A | Discharge status: Home / Self Care | Payer: BLUE CROSS/BLUE SHIELD | Source: Ambulatory Visit | Attending: Cardiology | Admitting: Cardiology

## 2014-08-17 DIAGNOSIS — I5020 Unspecified systolic (congestive) heart failure: Secondary | ICD-10-CM | POA: Insufficient documentation

## 2014-08-17 DIAGNOSIS — I5022 Chronic systolic (congestive) heart failure: Secondary | ICD-10-CM | POA: Insufficient documentation

## 2014-08-17 NOTE — Progress Notes (Signed)
Cardiac Rehab Medication Review by a Pharmacist  Does the patient  feel that his/her medications are working for him/her?  yes  Has the patient been experiencing any side effects to the medications prescribed?  Yes - weight gain  Does the patient measure his/her own blood pressure or blood glucose at home?  no   Does the patient have any problems obtaining medications due to transportation or finances?   no  Understanding of regimen: excellent Understanding of indications: excellent Potential of compliance: good    Pharmacist comments: Pt reports no medication issues besides weight gain. Reports missing lasix occasionally but good compliance overall. Excellent medication understanding.  Elicia Lamp, PharmD Clinical Pharmacist - Resident Pager [phone] 08/17/2014 8:44 AM

## 2014-08-21 ENCOUNTER — Other Ambulatory Visit (HOSPITAL_COMMUNITY): Payer: Self-pay | Admitting: Cardiology

## 2014-08-21 ENCOUNTER — Telehealth (HOSPITAL_COMMUNITY): Payer: Self-pay

## 2014-08-21 ENCOUNTER — Encounter (HOSPITAL_COMMUNITY)
Admission: RE | Admit: 2014-08-21 | Discharge: 2014-08-21 | Disposition: A | Discharge status: Home / Self Care | Payer: BLUE CROSS/BLUE SHIELD | Source: Ambulatory Visit | Attending: Cardiology | Admitting: Cardiology

## 2014-08-21 DIAGNOSIS — I5020 Unspecified systolic (congestive) heart failure: Secondary | ICD-10-CM | POA: Diagnosis not present

## 2014-08-21 DIAGNOSIS — I5022 Chronic systolic (congestive) heart failure: Secondary | ICD-10-CM | POA: Diagnosis not present

## 2014-08-21 MED ORDER — POTASSIUM CHLORIDE CRYS ER 20 MEQ PO TBCR: 20.0000 meq | EXTENDED_RELEASE_TABLET | Freq: Every day | ORAL | Status: DC

## 2014-08-21 NOTE — Progress Notes (Signed)
[name] is here for her for her first day of exercise. Telemetry rhythm Sinus with intermittent frequent multifocal frequent PVC's. Upon review of [name]'s medications and recent lab work, [name]'s last K was 3.4. The heart failure clinic was called and notified about frequent PVC's and potassium level. Patient was instructed via phone to pick up prescription for KDUR 20 meq's once a day. Initial blood pressure 118/90 and difficult to auscultate. Radial blood pressure easier to hear. Exit blood pressure 112/70.  Patient asymptomatic. PHQ =0. Will fax exercise flow sheets to Dr. Claris Gladden office for review with today's ECG tracings.

## 2014-08-21 NOTE — Telephone Encounter (Signed)
Serum Potassium 3.4 at 08/15/14 appointment.  Per [facility], advised patient to begin taking 45meq potassium once daily.  Confirmed preferred pharmacy with patient's daughter, Rx sent electronically.  Renee Pain

## 2014-08-23 ENCOUNTER — Telehealth (HOSPITAL_COMMUNITY): Payer: Self-pay | Admitting: *Deleted

## 2014-08-23 ENCOUNTER — Encounter (HOSPITAL_COMMUNITY): Admission: RE | Admit: 2014-08-23 | Payer: BLUE CROSS/BLUE SHIELD | Source: Ambulatory Visit

## 2014-08-25 ENCOUNTER — Encounter (HOSPITAL_COMMUNITY)
Admission: RE | Admit: 2014-08-25 | Discharge: 2014-08-25 | Disposition: A | Discharge status: Home / Self Care | Payer: BLUE CROSS/BLUE SHIELD | Source: Ambulatory Visit | Attending: Cardiology | Admitting: Cardiology

## 2014-08-25 DIAGNOSIS — I5020 Unspecified systolic (congestive) heart failure: Secondary | ICD-10-CM | POA: Diagnosis not present

## 2014-08-25 DIAGNOSIS — I5022 Chronic systolic (congestive) heart failure: Secondary | ICD-10-CM | POA: Diagnosis not present

## 2014-08-25 NOTE — Progress Notes (Signed)
[name] completed her second day of exercise without difficulty. Received the okay for patient to return to exercise per Darrick Grinder NP. Less ectopy noted today. Vital signs table. Will continue to monitor the patient throughout  the program.

## 2014-08-28 ENCOUNTER — Encounter (HOSPITAL_COMMUNITY)
Admission: RE | Admit: 2014-08-28 | Discharge: 2014-08-28 | Disposition: A | Discharge status: Home / Self Care | Payer: BLUE CROSS/BLUE SHIELD | Source: Ambulatory Visit | Attending: Cardiology | Admitting: Cardiology

## 2014-08-28 DIAGNOSIS — I5020 Unspecified systolic (congestive) heart failure: Secondary | ICD-10-CM | POA: Diagnosis not present

## 2014-08-28 DIAGNOSIS — I5022 Chronic systolic (congestive) heart failure: Secondary | ICD-10-CM | POA: Diagnosis not present

## 2014-08-28 NOTE — Progress Notes (Signed)
Reviewed home exercise with pt today.  Pt plans to continue walking at home on nearby track for exercise.  Reviewed THR, pulse, RPE, sign and symptoms, and when to call 911 or MD.  Pt voiced understanding. Alberteen Sam, MA, ACSM RCEP

## 2014-08-30 ENCOUNTER — Encounter (HOSPITAL_COMMUNITY)
Admission: RE | Admit: 2014-08-30 | Discharge: 2014-08-30 | Disposition: A | Discharge status: Home / Self Care | Payer: BLUE CROSS/BLUE SHIELD | Source: Ambulatory Visit | Attending: Cardiology | Admitting: Cardiology

## 2014-08-30 DIAGNOSIS — I5020 Unspecified systolic (congestive) heart failure: Secondary | ICD-10-CM | POA: Diagnosis not present

## 2014-08-30 DIAGNOSIS — I5022 Chronic systolic (congestive) heart failure: Secondary | ICD-10-CM | POA: Diagnosis not present

## 2014-09-01 ENCOUNTER — Encounter (HOSPITAL_COMMUNITY)
Admission: RE | Admit: 2014-09-01 | Discharge: 2014-09-01 | Disposition: A | Discharge status: Home / Self Care | Payer: BLUE CROSS/BLUE SHIELD | Source: Ambulatory Visit | Attending: Cardiology | Admitting: Cardiology

## 2014-09-01 DIAGNOSIS — I5022 Chronic systolic (congestive) heart failure: Secondary | ICD-10-CM | POA: Diagnosis not present

## 2014-09-01 DIAGNOSIS — I5020 Unspecified systolic (congestive) heart failure: Secondary | ICD-10-CM | POA: Diagnosis not present

## 2014-09-04 ENCOUNTER — Encounter (HOSPITAL_COMMUNITY)
Admission: RE | Admit: 2014-09-04 | Discharge: 2014-09-04 | Disposition: A | Discharge status: Home / Self Care | Payer: BLUE CROSS/BLUE SHIELD | Source: Ambulatory Visit | Attending: Cardiology | Admitting: Cardiology

## 2014-09-04 DIAGNOSIS — I5022 Chronic systolic (congestive) heart failure: Secondary | ICD-10-CM | POA: Diagnosis not present

## 2014-09-04 DIAGNOSIS — I5020 Unspecified systolic (congestive) heart failure: Secondary | ICD-10-CM | POA: Diagnosis not present

## 2014-09-06 ENCOUNTER — Encounter (HOSPITAL_COMMUNITY)
Admission: RE | Admit: 2014-09-06 | Discharge: 2014-09-06 | Disposition: A | Discharge status: Home / Self Care | Payer: BLUE CROSS/BLUE SHIELD | Source: Ambulatory Visit | Attending: Cardiology | Admitting: Cardiology

## 2014-09-06 DIAGNOSIS — I5022 Chronic systolic (congestive) heart failure: Secondary | ICD-10-CM | POA: Diagnosis not present

## 2014-09-06 DIAGNOSIS — I5020 Unspecified systolic (congestive) heart failure: Secondary | ICD-10-CM | POA: Diagnosis not present

## 2014-09-08 ENCOUNTER — Encounter (HOSPITAL_COMMUNITY)
Admission: RE | Admit: 2014-09-08 | Discharge: 2014-09-08 | Disposition: A | Discharge status: Home / Self Care | Payer: BLUE CROSS/BLUE SHIELD | Source: Ambulatory Visit | Attending: Cardiology | Admitting: Cardiology

## 2014-09-08 DIAGNOSIS — I5020 Unspecified systolic (congestive) heart failure: Secondary | ICD-10-CM | POA: Diagnosis not present

## 2014-09-08 DIAGNOSIS — I5022 Chronic systolic (congestive) heart failure: Secondary | ICD-10-CM | POA: Diagnosis not present

## 2014-09-08 NOTE — Progress Notes (Signed)
Reviewed [name]'s quality of life questionnaire this morning. [name] reports having more energy since beginning exercise at cardiac rehab. Will continue to monitor the patient throughout  the program.

## 2014-09-11 ENCOUNTER — Encounter (HOSPITAL_COMMUNITY): Payer: BLUE CROSS/BLUE SHIELD

## 2014-09-12 ENCOUNTER — Encounter: Payer: Self-pay | Admitting: Internal Medicine

## 2014-09-13 ENCOUNTER — Encounter (HOSPITAL_COMMUNITY)
Admission: RE | Admit: 2014-09-13 | Discharge: 2014-09-13 | Disposition: A | Discharge status: Home / Self Care | Payer: BLUE CROSS/BLUE SHIELD | Source: Ambulatory Visit | Attending: Cardiology | Admitting: Cardiology

## 2014-09-13 DIAGNOSIS — I5020 Unspecified systolic (congestive) heart failure: Secondary | ICD-10-CM | POA: Diagnosis not present

## 2014-09-13 DIAGNOSIS — I5022 Chronic systolic (congestive) heart failure: Secondary | ICD-10-CM | POA: Diagnosis not present

## 2014-09-15 ENCOUNTER — Encounter (HOSPITAL_COMMUNITY)
Admission: RE | Admit: 2014-09-15 | Discharge: 2014-09-15 | Disposition: A | Discharge status: Home / Self Care | Payer: BLUE CROSS/BLUE SHIELD | Source: Ambulatory Visit | Attending: Cardiology | Admitting: Cardiology

## 2014-09-15 DIAGNOSIS — I5022 Chronic systolic (congestive) heart failure: Secondary | ICD-10-CM | POA: Diagnosis not present

## 2014-09-15 DIAGNOSIS — I5020 Unspecified systolic (congestive) heart failure: Secondary | ICD-10-CM | POA: Diagnosis not present

## 2014-09-18 ENCOUNTER — Encounter (HOSPITAL_COMMUNITY)
Admission: RE | Admit: 2014-09-18 | Discharge: 2014-09-18 | Disposition: A | Discharge status: Home / Self Care | Payer: BLUE CROSS/BLUE SHIELD | Source: Ambulatory Visit | Attending: Cardiology | Admitting: Cardiology

## 2014-09-18 DIAGNOSIS — I5020 Unspecified systolic (congestive) heart failure: Secondary | ICD-10-CM | POA: Diagnosis not present

## 2014-09-18 DIAGNOSIS — I5022 Chronic systolic (congestive) heart failure: Secondary | ICD-10-CM | POA: Diagnosis not present

## 2014-09-20 ENCOUNTER — Encounter (HOSPITAL_COMMUNITY): Payer: BLUE CROSS/BLUE SHIELD

## 2014-09-22 ENCOUNTER — Encounter (HOSPITAL_COMMUNITY)
Admission: RE | Admit: 2014-09-22 | Discharge: 2014-09-22 | Disposition: A | Discharge status: Home / Self Care | Payer: BLUE CROSS/BLUE SHIELD | Source: Ambulatory Visit | Attending: Cardiology | Admitting: Cardiology

## 2014-09-22 DIAGNOSIS — I5020 Unspecified systolic (congestive) heart failure: Secondary | ICD-10-CM | POA: Diagnosis not present

## 2014-09-22 DIAGNOSIS — I5022 Chronic systolic (congestive) heart failure: Secondary | ICD-10-CM | POA: Diagnosis not present

## 2014-09-25 ENCOUNTER — Ambulatory Visit (HOSPITAL_COMMUNITY)
Admission: RE | Admit: 2014-09-25 | Discharge: 2014-09-25 | Disposition: A | Discharge status: Home / Self Care | Payer: BLUE CROSS/BLUE SHIELD | Source: Ambulatory Visit | Attending: Cardiology | Admitting: Cardiology

## 2014-09-25 ENCOUNTER — Encounter (HOSPITAL_COMMUNITY)
Admission: RE | Admit: 2014-09-25 | Discharge: 2014-09-25 | Disposition: A | Discharge status: Home / Self Care | Payer: BLUE CROSS/BLUE SHIELD | Source: Ambulatory Visit | Attending: Cardiology | Admitting: Cardiology

## 2014-09-25 ENCOUNTER — Telehealth (HOSPITAL_COMMUNITY): Payer: Self-pay | Admitting: *Deleted

## 2014-09-25 DIAGNOSIS — I272 Other secondary pulmonary hypertension: Secondary | ICD-10-CM | POA: Diagnosis not present

## 2014-09-25 DIAGNOSIS — I5022 Chronic systolic (congestive) heart failure: Secondary | ICD-10-CM

## 2014-09-25 DIAGNOSIS — I5020 Unspecified systolic (congestive) heart failure: Secondary | ICD-10-CM | POA: Diagnosis not present

## 2014-09-25 LAB — BASIC METABOLIC PANEL
Anion gap: 8 (ref 5–15)
BUN: 8 mg/dL (ref 6–23)
CO2: 26 mmol/L (ref 19–32)
Calcium: 9.4 mg/dL (ref 8.4–10.5)
Chloride: 103 mmol/L (ref 96–112)
Creatinine, Ser: 0.92 mg/dL (ref 0.50–1.10)
GFR calc Af Amer: 87 mL/min — ABNORMAL LOW (ref >90)
GFR calc non Af Amer: 75 mL/min — ABNORMAL LOW (ref >90)
Glucose, Bld: 89 mg/dL (ref 70–99)
Potassium: 4.1 mmol/L (ref 3.5–5.1)
Sodium: 137 mmol/L (ref 135–145)

## 2014-09-25 NOTE — Progress Notes (Signed)
Increased ectopy noted today during exercise at cardiac rehab. Patient asymptomatic. Intermittent couplets noted. Vital signs stable. The CHF clinic was called and notified spoke with heather Schrubb RN. ECG tracings faxed to Dr Claris Gladden office for review. Dr [name] reviewed ECG tracings and ordered a BMET. Patient went after class to have her blood drawn. Will continue to monitor the patient throughout  the program.

## 2014-09-25 NOTE — Telephone Encounter (Signed)
Received call from Verdis Frederickson, RN in cardiac rehab regarding pt, she states pt is having more ectopy on monitor today, pt is asymptomatic she wants MD to be aware, she faxed strips.  Dr [name] reviewed strips and recommends pt have bmet, Verdis Frederickson is aware and will have pt stop by clinic for lab today

## 2014-09-27 ENCOUNTER — Encounter (HOSPITAL_COMMUNITY)
Admission: RE | Admit: 2014-09-27 | Discharge: 2014-09-27 | Disposition: A | Discharge status: Home / Self Care | Payer: BLUE CROSS/BLUE SHIELD | Source: Ambulatory Visit | Attending: Cardiology | Admitting: Cardiology

## 2014-09-27 DIAGNOSIS — I5020 Unspecified systolic (congestive) heart failure: Secondary | ICD-10-CM | POA: Diagnosis not present

## 2014-09-27 DIAGNOSIS — I5022 Chronic systolic (congestive) heart failure: Secondary | ICD-10-CM | POA: Diagnosis not present

## 2014-09-27 NOTE — Progress Notes (Signed)
[name] November 44 y.o. female Nutrition Note Spoke with pt. Nutrition Plan and Nutrition Survey goals reviewed with pt. Pt is following Step 1  of the Therapeutic Lifestyle Changes diet. Pt wants to lose wt. Pt has been trying to lose wt by decreasing portion sizes and tracking her intake on the [facility] app on her phone. Pt reports trying "all kinds of diets from Atkins to Weight Watcher's." Pt has especially had difficulty losing wt "since my thyroid problem started." Pt has an appointment 10/05/14 for a consultation for the gastric sleeve procedure. Wt loss tips reviewed. Pt has CHF and is watching sodium intake carefully. Pt chooses mostly fresh/frozen fruit and vegetables. Pt expressed understanding of the information reviewed. Pt aware of nutrition education classes offered. No results found for: HGBA1C Nutrition Diagnosis ? Food-and nutrition-related knowledge deficit related to lack of exposure to information as related to diagnosis of: ? CVD ? Obesity related to excessive energy intake as evidenced by a BMI of 50.6  Nutrition RX/ Estimated Daily Nutrition Needs for: wt oss  1700-2200 Kcal, 45-60 gm fat, 11-17 gm sat fat, 1.6-2.2 gm trans-fat, <1500 mg sodium  Nutrition Intervention ? Pt's individual nutrition plan reviewed with pt. ? Benefits of adopting Therapeutic Lifestyle Changes discussed when Medficts reviewed. ? Pt to attend the Portion Distortion class  ? Pt given handouts for: ? Nutrition I class ? Nutrition II class ? Continue client-centered nutrition education by RD, as part of interdisciplinary care. Goal(s) ? Pt to identify and limit food sources of saturated fat, trans fat, and cholesterol ? Pt to identify food quantities necessary to achieve: ? wt loss to a goal wt of 260-278 lb (118.6-126.8 kg) at graduation from cardiac rehab.  Monitor and Evaluate progress toward nutrition goal with team. Nutrition Risk: Change to Moderate Derek Mound, M.Ed, RD, LDN, CDE 09/27/2014  10:41 AM

## 2014-09-29 ENCOUNTER — Encounter (HOSPITAL_COMMUNITY)
Admission: RE | Admit: 2014-09-29 | Discharge: 2014-09-29 | Disposition: A | Discharge status: Home / Self Care | Payer: BLUE CROSS/BLUE SHIELD | Source: Ambulatory Visit | Attending: Cardiology | Admitting: Cardiology

## 2014-09-29 DIAGNOSIS — I5020 Unspecified systolic (congestive) heart failure: Secondary | ICD-10-CM | POA: Diagnosis not present

## 2014-09-29 DIAGNOSIS — I5022 Chronic systolic (congestive) heart failure: Secondary | ICD-10-CM | POA: Diagnosis not present

## 2014-10-02 ENCOUNTER — Encounter (HOSPITAL_COMMUNITY)
Admission: RE | Admit: 2014-10-02 | Discharge: 2014-10-02 | Disposition: A | Discharge status: Home / Self Care | Payer: BLUE CROSS/BLUE SHIELD | Source: Ambulatory Visit | Attending: Cardiology | Admitting: Cardiology

## 2014-10-02 DIAGNOSIS — I5020 Unspecified systolic (congestive) heart failure: Secondary | ICD-10-CM | POA: Diagnosis not present

## 2014-10-02 DIAGNOSIS — I5022 Chronic systolic (congestive) heart failure: Secondary | ICD-10-CM | POA: Diagnosis not present

## 2014-10-04 ENCOUNTER — Encounter (HOSPITAL_COMMUNITY)
Admission: RE | Admit: 2014-10-04 | Discharge: 2014-10-04 | Disposition: A | Discharge status: Home / Self Care | Payer: BLUE CROSS/BLUE SHIELD | Source: Ambulatory Visit | Attending: Cardiology | Admitting: Cardiology

## 2014-10-04 DIAGNOSIS — I5020 Unspecified systolic (congestive) heart failure: Secondary | ICD-10-CM | POA: Diagnosis not present

## 2014-10-04 DIAGNOSIS — I5022 Chronic systolic (congestive) heart failure: Secondary | ICD-10-CM | POA: Diagnosis not present

## 2014-10-05 ENCOUNTER — Other Ambulatory Visit: Payer: Self-pay | Admitting: General Surgery

## 2014-10-06 ENCOUNTER — Encounter (HOSPITAL_COMMUNITY): Payer: BLUE CROSS/BLUE SHIELD

## 2014-10-09 ENCOUNTER — Encounter (HOSPITAL_COMMUNITY)
Admission: RE | Admit: 2014-10-09 | Discharge: 2014-10-09 | Disposition: A | Discharge status: Home / Self Care | Payer: BLUE CROSS/BLUE SHIELD | Source: Ambulatory Visit | Attending: Cardiology | Admitting: Cardiology

## 2014-10-09 DIAGNOSIS — I5022 Chronic systolic (congestive) heart failure: Secondary | ICD-10-CM | POA: Diagnosis not present

## 2014-10-09 DIAGNOSIS — I5020 Unspecified systolic (congestive) heart failure: Secondary | ICD-10-CM | POA: Diagnosis not present

## 2014-10-11 ENCOUNTER — Encounter (HOSPITAL_COMMUNITY)
Admission: RE | Admit: 2014-10-11 | Discharge: 2014-10-11 | Disposition: A | Discharge status: Home / Self Care | Payer: BLUE CROSS/BLUE SHIELD | Source: Ambulatory Visit | Attending: Cardiology | Admitting: Cardiology

## 2014-10-11 DIAGNOSIS — I5022 Chronic systolic (congestive) heart failure: Secondary | ICD-10-CM | POA: Diagnosis not present

## 2014-10-11 DIAGNOSIS — I5020 Unspecified systolic (congestive) heart failure: Secondary | ICD-10-CM | POA: Diagnosis not present

## 2014-10-12 ENCOUNTER — Telehealth: Payer: Self-pay | Admitting: Cardiology

## 2014-10-12 ENCOUNTER — Ambulatory Visit (INDEPENDENT_AMBULATORY_CARE_PROVIDER_SITE_OTHER): Payer: BLUE CROSS/BLUE SHIELD | Admitting: *Deleted

## 2014-10-12 DIAGNOSIS — I5022 Chronic systolic (congestive) heart failure: Secondary | ICD-10-CM | POA: Diagnosis not present

## 2014-10-12 DIAGNOSIS — I428 Other cardiomyopathies: Secondary | ICD-10-CM

## 2014-10-12 DIAGNOSIS — I429 Cardiomyopathy, unspecified: Secondary | ICD-10-CM | POA: Diagnosis not present

## 2014-10-12 NOTE — Telephone Encounter (Signed)
LMOVM reminding pt to send remote transmission.   

## 2014-10-12 NOTE — Progress Notes (Signed)
Remote ICD transmission.   

## 2014-10-13 ENCOUNTER — Encounter (HOSPITAL_COMMUNITY)
Admission: RE | Admit: 2014-10-13 | Discharge: 2014-10-13 | Disposition: A | Discharge status: Home / Self Care | Payer: BLUE CROSS/BLUE SHIELD | Source: Ambulatory Visit | Attending: Cardiology | Admitting: Cardiology

## 2014-10-13 DIAGNOSIS — I5022 Chronic systolic (congestive) heart failure: Secondary | ICD-10-CM | POA: Diagnosis not present

## 2014-10-13 DIAGNOSIS — I5020 Unspecified systolic (congestive) heart failure: Secondary | ICD-10-CM | POA: Diagnosis not present

## 2014-10-14 NOTE — Discharge Summary (Signed)
PATIENT NAME:  [name], PETRIDES MR#:  [id number] DATE OF BIRTH:  Jan 16, 1971  DATE OF ADMISSION:  09/19/2013  ADMITTING PHYSICIAN:  Dr. Laurin Coder  DATE OF DISCHARGE:  09/24/2013  DISCHARGING PHYSICIAN:  Dr. Gladstone Lighter  PRIMARY CARE PHYSICIAN:  Dr. Yevonne Aline IN THE HOSPITAL: Cardiology consultation by Dr. Nehemiah Massed.   DISCHARGE DIAGNOSES:  1.  Non-ST segment elevation myocardial infarction, with minimal obstructive coronary artery disease.  2.  History of chronic systolic congestive heart failure, with ejection fraction less than 25%, status post AICD.   3.  Gastroesophageal reflux disease.  4.  Obesity.   DISCHARGE HOME MEDICATIONS:  1.  Aspirin 81 mg p.o. daily.  2.  Coreg 12.5 mg p.o. b.i.d.  3.  Synthroid 88 mcg p.o. daily.  4.  Lasix 40 mg p.o. daily.  5.  Lisinopril 10 mg a daily. 6.  Aldactone 25 mg p.o. daily.  7.  Digoxin 250 mcg p.o. daily.  8.  Atorvastatin 10 mg p.o. daily.   INSTRUCTIONS:  Discharge oxygen: None.   Discharge diet: Low-sodium diet.   Discharge activity: As tolerated.   FOLLOWUP INSTRUCTIONS: 1.  PCP followup in 2 to 3 weeks.  2.  Cardiology followup in 1 to 2 weeks.   LABS AND IMAGING STUDIES PRIOR TO DISCHARGE: Sodium 138, potassium 3.7, chloride 107, bicarb 25, BUN 11, creatinine 1.08, glucose of 82, calcium of 8.6.   CT angiogram per PE protocol showing negative for PE, cardiomegaly, mild lymphadenopathy in mediastinum and bilateral hilar lesions, scattered areas of air trapping without significant airspace disease or consolidation noted.   WBC 7.9, hemoglobin 11.9, hematocrit 36.8, platelet count 195. Serum cortisol level is 32.7. D-dimer is slightly elevated at 987 ng/ml. Cardiac catheterization revealing severe LV dilatation and dysfunction, Ejection fraction is 15%. Normal coronary arteries, without any stenosis. Urine showing no evidence of any infection.   BRIEF HOSPITAL COURSE: [name] is a 44 year old,  African-American female with history of nonischemic cardiomyopathy, EF of 15%, presents to the hospital secondary to chest pain, and noted to have elevated troponins.   1.  Chest pain. Ruled in for non-ST segment elevation MI, with elevated troponins. Was seen by Cardiology. No history of nonischemic cardiomyopathy. Because of ongoing chest pain, she had a cardiac catheterization done, which showed normal coronaries, so not sure if this was a coronary vasospasm. However, her pain has improved. She is on the appropriate cardiac medications with aspirin, statin, Coreg, and lisinopril.  2.  Nonischemic cardiomyopathy, ejection fraction is 15%, status post AICD.   3.  Systolic congestive heart failure. Well-compensated while in the hospital. She is on aspirin, Coreg, Lasix, lisinopril, Aldactone, digoxin, and statin.    4. Episode of hypotension while in the hospital post-cardiac catheterization. Probably dehydrated. She got a little bit of fluids, and she was started on digoxin for her cardiomyopathy, and that improved her blood pressure, so all her blood pressure medicines were restarted back at the time of discharge.   Her course has been otherwise uneventful in the hospital.   DISCHARGE CONDITION: Stable.   DISCHARGE DISPOSITION: Home.   Time spent on discharge is 40 minutes.    ____________________________ Gladstone Lighter, MD rk:mr D: 09/25/2013 12:07:52 ET T: 09/25/2013 18:59:27 ET JOB#: [id number]  cc: Gladstone Lighter, MD, <Dictator> Youlanda Roys. Lovie Macadamia, MD  Gladstone Lighter MD ELECTRONICALLY SIGNED 10/11/2013 11:10

## 2014-10-14 NOTE — H&P (Signed)
PATIENT NAME:  [name], [name] MR#:  [id number] DATE OF BIRTH:  January 08, 1971  DATE OF ADMISSION:  09/19/2013  REASON FOR ADMISSION: Chest pain and non-ST elevation MI.  PRIMARY CARE PHYSICIAN: [name], MD  CARDIOLOGIST: Dr. 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 Apparently the patient woke up last night around 3:00 a.m. with the complaint of chest pain that was located in the left upper chest radiating to the shoulder. She associated with that left arm tingling. The intensity of the pain was 8 out of 10 consistently and she could not sleep very well for what she decided to come here this morning to the ER. Once she arrived to the ER, she received some aspirin, some nausea medication, and her pain started to subside. At this moment, she has no pain. She states that she had associated nausea and sweating last night and shortness of breath whenever she lies down, which is chronic. The patient sleeps with 3 pillows due to significant orthopnea and now she has a reclining bed. Overall, in her history, the patient has been diagnosed with postpartum cardiomyopathy in 2000, and she has had some issues with her activity performance for what she has gone on disability. A year ago she used to be able to get up 17 steps upstairs in her house and now she progressed to stopping in the middle of the steps due to shortness of breath and this has been going on for at least 6 months. The patient has not had any significant chest pain up until last night and overall she has been very concerned about this situation. Here in the ER, her first set of troponin was negative. The second set of troponin bumped up to 1.8 for what the patient is being put on a heparin drip. The patient is admitted for treatment of non-ST elevation MI.  Her chest x-ray has significantly worsening cardiomyopathy or cardiomegaly for what I am going to do a stat echo as it appears to be significant pericardial effusion. The patient does not have any signs of tamponade at this moment.  REVIEW OF SYSTEMS: A 12 system review is done. CONSTITUTIONAL: No fever, fatigue. Positive weakness. No significant weight loss or weight gain.  EYES: No blurry vision, double vision.  EARS, NOSE, THROAT: No tinnitus, ear pain or hearing loss. RESPIRATORY: Positive dry cough with exercise. No wheezing. No hemoptysis. Positive dyspnea and orthopnea.  CARDIOVASCULAR: No chest pain up until last night. The patient has occasional edema of the lower extremities, but not today. No arrhythmias. No palpitations. The patient has an AICD.  GASTROINTESTINAL: No nausea, vomiting, abdominal pain, hematemesis or melena.  GENITOURINARY: No dysuria, hematuria.  GYNECOLOGIC: No breast masses.  ENDOCRINE: No polyuria, polydipsia, polyphagia, cold or heat intolerance. The patient has thyroid disease. This has been well controlled. HEMATOLOGIC AND LYMPHATIC: No anemia, easy bruising or swollen glands.  SKIN: No rashes or petechiae.  MUSCULOSKELETAL: No significant neck pain or back pain.  NEUROLOGIC: No numbness, tingling, or TIA.   PSYCHIATRIC: No insomnia or depression.   PAST MEDICAL HISTORY: 1.  Postpartum cardiomyopathy in 2000.  2.  Peptic ulcer disease.  3.  H. pylori.  4.  Systolic CHF.  5.  Hypothyroidism.   PAST SURGICAL HISTORY: 1.  AICD.  2.  D and C.  3.  Cesarean section. 4.  Cardiac catheterization.   CURRENT MEDICATIONS:  1.  Coreg. 2.  Lasix. 3.  Aldactone.  4.  Lisinopril.  ALLERGIES: No known drug allergies.   FAMILY HISTORY: Positive for MI in her grandmother, hypertension and diabetes as well.   SOCIAL HISTORY: The patient is married. She has 2 children. She is disabled due to her congestive heart failure. She does not drink or smoke. No use of  drugs.   PHYSICAL EXAMINATION: VITAL SIGNS: Her blood pressure is 140/87, pulse 97, respirations 17, oxygen saturation 98% in room air, temperature 98.5. GENERAL: Alert and oriented x3, in no acute distress. No respiratory distress. Hemodynamically stable.  HEENT: Pupils are equal and reactive. Extraocular movements are intact. Mucosa is moist. Anicteric sclerae. Pink conjunctivae. No oral lesions. No oropharyngeal exudates.  NECK: Supple. There is no JVD or sign of tamponade. No flushing. No engorged veins at the level of upper chest, face or neck. No rigidity of the neck.  HEART: Regular rate and rhythm. No significant murmurs or gallops, although her heart tones are very distant. There is no S3 or S4.  LUNGS: Clear without any wheezing or crepitus. No use of accessory muscles. There are decreased respiratory sounds in basis.  ABDOMEN: Soft, nontender, nondistended. No hepatosplenomegaly. No masses. Bowel sounds are positive.  GENITAL: Deferred.  EXTREMITIES: No edema, cyanosis or clubbing at this moment. Pulses +2. Capillary refill less 3.   SKIN: No rashes or petechiae.  NEUROLOGIC: Cranial nerves II through XII intact. Strength is 5/5 in all 4 extremities.  PSYCHIATRIC: No anxiety, depression, or agitation.  LYMPHATIC: Negative for lymphadenopathy in the neck or supraclavicular areas.  MUSCULOSKELETAL: No joint effusions or joint swelling.   RESULTS: Her creatinine is 1.23. Her BNP is 1400. Her glucose is 124, potassium 3.6. Total protein 7.7, bilirubin 0.4. Her first troponin is 0.05. Her second troponin is 1.8. Her third troponin is 4.12. White count is 10,000, hemoglobin is 13,000, and platelet count 286,000.   EKG: Sinus tachycardia. No significant ST depressions or elevations. Incomplete left bundle branch.   Chest x-ray: Significant increase in the size of her heart, cardiomegaly. No significant pulmonary edema or consolidations.   ASSESSMENT AND PLAN: This is a very nice  44 year old female with history of significant postpartum cardiomyopathy, significant congestive heart failure with systolic dysfunction. The patient has an AICD, also has hypertension, hypothyroidism.  1.  Non-ST elevation myocardial infarction. The patient has positive troponins with significant increase from 0.05 up to 4.2. No EKG changes at this moment, although the patient has significant dilated cardiomyopathy. Echocardiogram has been done stat as the patient had increase in the size of her heart from previous x-rays. Echocardiogram right now by visualization, not formal report, shows no significant pleural effusion, just minimal layering of fluid around the pericardium. The patient does have a very decreased ejection fraction with machine calculated around 10%.  Still I do not have the formal report, please notice. The patient has be started on aspirin and heparin drip, continuation of beta blocker, nitroglycerin stat in patch form, morphine p.r.n. and oxygen supplementation as needed. The patient is going to be seen by cardiologist and they will decide if she requires intervention versus medical management. At this moment, we are going to admit her under telemetry monitoring. She does not have any signs of congestive heart failure exacerbation. She does not have any pain whatsoever at this moment. Start the patient on a statin as she is not taking anything and check her cholesterol. Monitor risk  factors. She has slight elevation of her glucose for what we are going to do a Hemoglobin A1c as well.  2. As far as her other medical problems, congestive heart failure, seems to be stable. No signs of acute edema, no signs of acute pulmonary edema. Continue Coreg, Lasix, Aldactone and lisinopril. 3.  Hypothyroidism. Continue treatment with Synthroid. 4.  History of peptic ulcer disease and gastrointestinal prophylaxis. The patient is on Protonix.  5.  Deep vein thrombosis prophylaxis. The patient is on heparin  drip.  She is going to be seen by a cardiologist.  CODE STATUS: FULL.  TIME SPENT: About 45 minutes with this patient.  ____________________________ [place] Sink, MD rsg:sb D: 09/19/2013 13:15:57 ET T: 09/19/2013 14:24:53 ET JOB#: [id number]  cc: [place] Sink, MD, <Dictator> Xiadani Damman America Brown MD ELECTRONICALLY SIGNED 09/27/2013 21:28

## 2014-10-14 NOTE — Consult Note (Signed)
PATIENT NAME:  [name], [name] MR#:  [id number] DATE OF BIRTH:  1971-03-24  DATE OF CONSULTATION:  09/19/2013  CONSULTING PHYSICIAN:  Corey Skains, MD  CONSULTING PHYSICIAN: Vaughan Basta, MD  REASON FOR CONSULTATION: A 44 year old female with acute myocardial infarction and cardiomyopathy.   CHIEF COMPLAINT: "I had chest pain."  HISTORY OF PRESENT ILLNESS: This 44 year old female with known severe dilated cardiomyopathy with a low ejection fraction status post ICD replacement with heart failure who has had no evidence of heart failure symptoms, but had a severe substernal chest discomfort radiating into her back, lasting several hours with shortness of breath. This has been relieved with appropriate medication management and now has an EKG showing normal sinus rhythm, left axis deviation, left intraventricular conduction defect with an elevated troponin of 4. This is most consistent with acute myocardial infarction of unknown etiology. Now the patient feels quite well, on appropriate medications. Echocardiogram has shown severe LV systolic dysfunction and severe dilated cardiomyopathy with ejection fraction of 15%.  REVIEW OF SYSTEMS: The remainder of review of systems negative for vision change, ringing in the ears, hearing loss, cough, congestion, heartburn, nausea, vomiting, diarrhea, bloody stools, stomach pain, extremity pain, leg weakness, cramping of the buttocks, known blood clots, headaches, blackouts, dizzy spells, nosebleeds, congestion, trouble swallowing, frequent urination, urination at night, muscle weakness, numbness, anxiety, depression, skin lesions or skin rashes.   PAST MEDICAL HISTORY: Severe dilated cardiomyopathy.   FAMILY HISTORY: No family members with early onset of cardiovascular disease or hypertension.   SOCIAL HISTORY: Current denies alcohol or tobacco use.   MEDICATIONS: As listed.   ALLERGIES: As listed.   PHYSICAL EXAMINATION:  VITAL SIGNS: Blood  pressure is 102/66 bilaterally, heart rate 72 upright, reclining, and regular.  GENERAL: She is a well appearing female in no acute distress.  HEENT: No icterus, thyromegaly, ulcers, hemorrhage, or xanthelasma.  CARDIOVASCULAR: Regular rate and rhythm. Normal S1 and S2 with a 2 to 3/6 apical murmur consistent with mitral regurgitation. PMI is diffuse. Carotid upstroke normal without  normal.  LUNGS: Clear to auscultation with normal respirations.  ABDOMEN: Soft, nontender, without hepatosplenomegaly or masses. Abdominal aorta is normal size without bruit.  EXTREMITIES: Shows 2+ bilateral pulses in dorsal, pedal, radial and femoral arteries without lower extremity edema, cyanosis, clubbing, or ulcers.  NEUROLOGIC: She is oriented to time, place, and person, with normal mood and affect.   ASSESSMENT: A 44 year old female with severe dilated cardiomyopathy, hypertension, hyperlipidemia with acute subendocardial myocardial infarction of unknown etiology.   RECOMMENDATIONS:  1. Heparin for 24-48 hours for further evaluation. 2. Possible stress test versus cardiac catheterization if the patient has recurrent chest discomfort, needing further evaluation.  3. Continue beta blocker, ACE inhibitor, Aldactone, and aspirin for further risk reduction in cardiovascular disease and cardiomyopathy with Lasix as well. 4. Echocardiogram to further evaluate further significant symptoms or issues if we need further evaluation.    ____________________________ Corey Skains, MD bjk:lt D: 09/19/2013 17:47:10 ET T: 09/19/2013 21:55:37 ET JOB#: [id number]  cc: Corey Skains, MD, <Dictator> Corey Skains MD ELECTRONICALLY SIGNED 09/26/2013 13:01

## 2014-10-15 LAB — MDC_IDC_ENUM_SESS_TYPE_REMOTE
Battery Remaining Longevity: 134 mo
Battery Voltage: 3.06 V
Brady Statistic RV Percent Paced: 0.01 %
Date Time Interrogation Session: 20160421193625
HighPow Impedance: 72 Ohm
Lead Channel Impedance Value: 494 Ohm
Lead Channel Impedance Value: 646 Ohm
Lead Channel Pacing Threshold Amplitude: 0.625 V
Lead Channel Pacing Threshold Pulse Width: 0.4 ms
Lead Channel Sensing Intrinsic Amplitude: 10.5 mV
Lead Channel Sensing Intrinsic Amplitude: 10.5 mV
Lead Channel Setting Pacing Amplitude: 2 V
Lead Channel Setting Pacing Pulse Width: 0.4 ms
Lead Channel Setting Sensing Sensitivity: 0.3 mV
Zone Setting Detection Interval: 250 ms
Zone Setting Detection Interval: 300 ms
Zone Setting Detection Interval: 450 ms

## 2014-10-16 ENCOUNTER — Encounter (HOSPITAL_COMMUNITY)
Admission: RE | Admit: 2014-10-16 | Discharge: 2014-10-16 | Disposition: A | Discharge status: Home / Self Care | Payer: BLUE CROSS/BLUE SHIELD | Source: Ambulatory Visit | Attending: Cardiology | Admitting: Cardiology

## 2014-10-16 DIAGNOSIS — I5022 Chronic systolic (congestive) heart failure: Secondary | ICD-10-CM | POA: Diagnosis not present

## 2014-10-16 DIAGNOSIS — I5020 Unspecified systolic (congestive) heart failure: Secondary | ICD-10-CM | POA: Diagnosis not present

## 2014-10-18 ENCOUNTER — Encounter (HOSPITAL_COMMUNITY)
Admission: RE | Admit: 2014-10-18 | Discharge: 2014-10-18 | Disposition: A | Discharge status: Home / Self Care | Payer: BLUE CROSS/BLUE SHIELD | Source: Ambulatory Visit | Attending: Cardiology | Admitting: Cardiology

## 2014-10-18 DIAGNOSIS — I5020 Unspecified systolic (congestive) heart failure: Secondary | ICD-10-CM | POA: Diagnosis not present

## 2014-10-18 DIAGNOSIS — I5022 Chronic systolic (congestive) heart failure: Secondary | ICD-10-CM | POA: Diagnosis not present

## 2014-10-20 ENCOUNTER — Encounter (HOSPITAL_COMMUNITY)
Admission: RE | Admit: 2014-10-20 | Discharge: 2014-10-20 | Disposition: A | Discharge status: Home / Self Care | Payer: BLUE CROSS/BLUE SHIELD | Source: Ambulatory Visit | Attending: Cardiology | Admitting: Cardiology

## 2014-10-20 DIAGNOSIS — I5020 Unspecified systolic (congestive) heart failure: Secondary | ICD-10-CM | POA: Diagnosis not present

## 2014-10-20 DIAGNOSIS — I5022 Chronic systolic (congestive) heart failure: Secondary | ICD-10-CM | POA: Diagnosis not present

## 2014-10-23 ENCOUNTER — Encounter (HOSPITAL_COMMUNITY)
Admission: RE | Admit: 2014-10-23 | Discharge: 2014-10-23 | Disposition: A | Discharge status: Home / Self Care | Payer: BLUE CROSS/BLUE SHIELD | Source: Ambulatory Visit | Attending: Cardiology | Admitting: Cardiology

## 2014-10-23 ENCOUNTER — Other Ambulatory Visit (HOSPITAL_COMMUNITY): Payer: Self-pay | Admitting: Anesthesiology

## 2014-10-23 DIAGNOSIS — I5022 Chronic systolic (congestive) heart failure: Secondary | ICD-10-CM | POA: Insufficient documentation

## 2014-10-23 DIAGNOSIS — I5020 Unspecified systolic (congestive) heart failure: Secondary | ICD-10-CM | POA: Insufficient documentation

## 2014-10-25 ENCOUNTER — Ambulatory Visit (HOSPITAL_COMMUNITY)
Admission: RE | Admit: 2014-10-25 | Discharge: 2014-10-25 | Disposition: A | Discharge status: Home / Self Care | Payer: BLUE CROSS/BLUE SHIELD | Source: Ambulatory Visit | Attending: Internal Medicine | Admitting: Internal Medicine

## 2014-10-25 ENCOUNTER — Encounter (HOSPITAL_COMMUNITY): Payer: Self-pay

## 2014-10-25 ENCOUNTER — Encounter (HOSPITAL_COMMUNITY)
Admission: RE | Admit: 2014-10-25 | Discharge: 2014-10-25 | Disposition: A | Discharge status: Home / Self Care | Payer: BLUE CROSS/BLUE SHIELD | Source: Ambulatory Visit | Attending: Cardiology | Admitting: Cardiology

## 2014-10-25 VITALS — BP 103/51 | HR 82 | Wt 280.0 lb

## 2014-10-25 DIAGNOSIS — Z01810 Encounter for preprocedural cardiovascular examination: Secondary | ICD-10-CM

## 2014-10-25 DIAGNOSIS — I5020 Unspecified systolic (congestive) heart failure: Secondary | ICD-10-CM | POA: Diagnosis not present

## 2014-10-25 DIAGNOSIS — I272 Other secondary pulmonary hypertension: Secondary | ICD-10-CM | POA: Diagnosis not present

## 2014-10-25 DIAGNOSIS — I10 Essential (primary) hypertension: Secondary | ICD-10-CM | POA: Insufficient documentation

## 2014-10-25 DIAGNOSIS — Z79899 Other long term (current) drug therapy: Secondary | ICD-10-CM | POA: Diagnosis not present

## 2014-10-25 DIAGNOSIS — I270 Primary pulmonary hypertension: Secondary | ICD-10-CM

## 2014-10-25 DIAGNOSIS — I252 Old myocardial infarction: Secondary | ICD-10-CM | POA: Diagnosis not present

## 2014-10-25 DIAGNOSIS — I5022 Chronic systolic (congestive) heart failure: Secondary | ICD-10-CM | POA: Diagnosis not present

## 2014-10-25 DIAGNOSIS — Z95810 Presence of automatic (implantable) cardiac defibrillator: Secondary | ICD-10-CM | POA: Insufficient documentation

## 2014-10-25 DIAGNOSIS — Z7982 Long term (current) use of aspirin: Secondary | ICD-10-CM | POA: Insufficient documentation

## 2014-10-25 DIAGNOSIS — E039 Hypothyroidism, unspecified: Secondary | ICD-10-CM | POA: Diagnosis not present

## 2014-10-25 DIAGNOSIS — I429 Cardiomyopathy, unspecified: Secondary | ICD-10-CM | POA: Diagnosis not present

## 2014-10-25 LAB — BASIC METABOLIC PANEL
Anion gap: 7 (ref 5–15)
BUN: 11 mg/dL (ref 6–20)
CO2: 26 mmol/L (ref 22–32)
Calcium: 9.2 mg/dL (ref 8.9–10.3)
Chloride: 105 mmol/L (ref 101–111)
Creatinine, Ser: 0.86 mg/dL (ref 0.44–1.00)
GFR calc Af Amer: >60 mL/min (ref >60)
GFR calc non Af Amer: >60 mL/min (ref >60)
Glucose, Bld: 90 mg/dL (ref 70–99)
Potassium: 4 mmol/L (ref 3.5–5.1)
Sodium: 138 mmol/L (ref 135–145)

## 2014-10-25 LAB — MAGNESIUM: Magnesium: 2.1 mg/dL (ref 1.7–2.4)

## 2014-10-25 NOTE — Addendum Note (Signed)
Encounter addended by: Jolaine Artist, MD on: 10/25/2014  6:03 PM<BR>     Documentation filed: Charges VN

## 2014-10-25 NOTE — Patient Instructions (Signed)
Please return 1 week for echocardiogram Labs today and follow-up in 3-4 months

## 2014-10-25 NOTE — Progress Notes (Signed)
Patient ID: [name], female   DOB: January 05, 1971, 44 y.o.   MRN: [id number]  PCP: [facility] in [place]  HPI: [name] is a 44 y.o. 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  She was followed for systolic CHF initially in [place]. Apparently her EF recovered to 35-40%. However, she was admitted to [facility] 09/19/13 for worsening dyspnea and CP. Coronaries were reportedly OK on LHC at that time at [facility]. Echo showed EF of 15-20% with four-chamber enlargement with moderate-severe TR/MR. RHC as below. Rheumatological w/u negative  She returns for follow up. She would like surgical clearance for gastric bypass sleeve. Denies SOB/PND/Orthopnea. Minor dyspnea with steps. Weight at home 274-275 pounds. Taking all medications. Attends cardiac rehab 3 days a week.   RHC 09/24/13  RA 14  RV 57/22  PA 64/36 (48)  PCWP 18  Fick CO/CI: 3.7/1.6  PVR 8.1 WU  Ao sat 95%  PA sat 47%   CPX  11/09/13  FEV1 2.6L (85%)  FVC 2.1L (86%)  FEV1/FVC 86%  pVO2 14.8 (82%)  Slope 31.7%  RER 1.11  Ve/MVV 86%   Echo (8/15) with EF 20%, moderate LV dilation, diffuse hypokinesis, normal RV size with mildly decreased systolic function, mild MR.   Sleep study did not show significant OSA.   RHC (8/15): Mean RA 3 PA 33/11 mean 19 Mean PCWP 8 CI 2.35  Holter (8/15) with < 1% PVCs  Optivol was checked today.  Fluid index was less than threshold with stable impedance.  2 hours per day.   Labs: (11/09/13): Dig level 0.5, K 3.6, Creatinine 0.83, pro-BNP 622 (12/19/13): K 3.6, creatinine 0.8 (7/15): K 4.1, creatinine 0.91 (8/15): K 3.9, creatinine 0.91 (10/15): K 4, creatinine 0.93, proBNP 129 (12/15): K 4, creatinine 0.9, digoxin 0.4  SH: Disabled since 2014. Lives her husband and 2 children. Does not drink alcohol or smoke   FH: Grandmother had heart failure   ROS: All systems negative except as listed in HPI, PMH  and Problem List.  Past Medical History  Diagnosis Date  . Hypertension   . Nonischemic cardiomyopathy   . Sinus tachycardia   . Nocturnal dyspnea   . Chronic CHF (congestive heart failure)     a. EF 15-20% b. RHC (09/2013) RA 14, RV 57/22, PA 64/36 (48), PCWP 18, FIck CO/CI 3.7/1.6, PVR 8.1 WU, PA sat 47%   . Morbid obesity   . Snoring-prob OSA 09/04/2011  . Sprint Fidelis ICD lead RECALL  [id number]   . Automatic implantable cardioverter-defibrillator in situ   . Myocardial infarction 08/2013  . Hypothyroidism   . History of stomach ulcers     Current Outpatient Prescriptions  Medication Sig Dispense Refill  . aspirin 81 MG tablet Take 81 mg by mouth daily.    Marland Kitchen atorvastatin (LIPITOR) 20 MG tablet Take 20 mg by mouth daily.    . carvedilol (COREG) 12.5 MG tablet TAKE ONE TABLET BY MOUTH TWICE A DAY WITH MEALS 60 tablet 4  . furosemide (LASIX) 40 MG tablet Take 2 tablets (80 mg total) by mouth daily. (Patient taking differently: Take 80-120 mg by mouth daily. Take 2-3 tablets daily for swelling.) 60 tablet 3  . levothyroxine (SYNTHROID, LEVOTHROID) 88 MCG tablet Take 88 mcg by mouth daily before breakfast.    . potassium chloride SA (KLOR-CON M20) 20 MEQ tablet Take 1 tablet (20 mEq total) by mouth daily. 90 tablet 3  .  Sacubitril-Valsartan (ENTRESTO) 24-26 MG TABS Take 24-26 mg by mouth 2 (two) times daily. 60 tablet 3  . spironolactone (ALDACTONE) 25 MG tablet TAKE ONE TABLET BY MOUTH ONE TIME DAILY  30 tablet 4  . digoxin (LANOXIN) 0.125 MG tablet TAKE ONE TABLET BY MOUTH ONE TIME DAILY  30 tablet 2  . psyllium (REGULOID) 0.52 G capsule Take 0.52 g by mouth as needed (GI issues).     No current facility-administered medications for this encounter.    Filed Vitals:   10/25/14 1433  BP: 103/51  Pulse: 82  Weight: 280 lb (127.007 kg)  SpO2: 100%    PHYSICAL EXAM: General: Well appearing. No respiratory difficulty. Obese.  HEENT: normal  Neck: supple. JVP not elevated. Carotids  2+ bilat; no bruits. No lymphadenopathy. Thyroid enlarged.   Cor: PMI nondisplaced. Regular rate & rhythm. No rubs, gallops or murmurs.  No edema.  Lungs: clear  Abdomen: Soft, nontender, nondistended. No hepatosplenomegaly. No bruits or masses. Good bowel sounds.  Extremities: no cyanosis, clubbing, rash. Neuro: alert & oriented x 3, cranial nerves grossly intact. moves all 4 extremities w/o difficulty. Affect pleasant.  ASSESSMENT & PLAN:  1. Chronic systolic HF: Nonischemic cardiomyopathy.  8/15 echo showed EF 20% with diffuse hypokinesis and moderate LV dilation, mild MR.  Cause for cardiomyopathy is uncertain: initially noted peri-partum (after son's birth), so cannot rule out peri-partum CMP.  She was noted at one point to have frequent PVCs but holter showed <1% PVCs.   RHC in 8/15 showed normal filling pressures and preserved cardiac output.  She does not qualify for CRT with narrow QRS.  NYHA II. - Continue current Coreg, spironolactone, Lasix, digoxin, and Entresto.  I will not titrate up Coreg or Entresto because SBP runs in the 90s .   2. Pulmonary HTN: Mixed picture with PVR 8.1 on RHC at [facility].  However, repeat study in 8/15 here showed no significant pulmonary hypertension.  3. Morbid obesity: Has been evaluated Dr Redmond Pulling for possible gastric sleeve. Check ECHO next week.  4. OSA:  Surprisingly, no significant OSA on sleep study.  5. PVCs: Holter monitor showed < 1% PVCs.  Suspect that this does not contribute to the cardiomyopathy significantly. Check BMET and mag level today.    Check BMEt and Magnesium today. She is at low risk for surgical complications    CLEGG,AMY NP-C 10/25/2014  Patient seen and examined with Darrick Grinder, NP. We discussed all aspects of the encounter. I agree with the assessment and plan as stated above.   She is very active without any significant HF symptoms. ICD interrogated personally which confirms good activity level. No VT/AF. Volume status well  controlled. Will repeat echo. Felt to be low to moderate risk for peri-op CV complications. Once echo complete, can proceed to bariatric surgery without further cardiac testing. Continue current HF meds.   Rylynn Kobs,MD 5:52 PM

## 2014-10-27 ENCOUNTER — Encounter (HOSPITAL_COMMUNITY)
Admission: RE | Admit: 2014-10-27 | Discharge: 2014-10-27 | Disposition: A | Discharge status: Home / Self Care | Payer: BLUE CROSS/BLUE SHIELD | Source: Ambulatory Visit | Attending: Cardiology | Admitting: Cardiology

## 2014-10-27 ENCOUNTER — Encounter (HOSPITAL_COMMUNITY): Payer: BLUE CROSS/BLUE SHIELD

## 2014-10-27 DIAGNOSIS — I509 Heart failure, unspecified: Secondary | ICD-10-CM | POA: Diagnosis not present

## 2014-10-27 DIAGNOSIS — E785 Hyperlipidemia, unspecified: Secondary | ICD-10-CM | POA: Diagnosis not present

## 2014-10-27 DIAGNOSIS — I5020 Unspecified systolic (congestive) heart failure: Secondary | ICD-10-CM | POA: Diagnosis not present

## 2014-10-27 DIAGNOSIS — E039 Hypothyroidism, unspecified: Secondary | ICD-10-CM | POA: Diagnosis not present

## 2014-10-30 ENCOUNTER — Encounter (HOSPITAL_COMMUNITY)
Admission: RE | Admit: 2014-10-30 | Discharge: 2014-10-30 | Disposition: A | Discharge status: Home / Self Care | Payer: BLUE CROSS/BLUE SHIELD | Source: Ambulatory Visit | Attending: Cardiology | Admitting: Cardiology

## 2014-10-30 DIAGNOSIS — I5020 Unspecified systolic (congestive) heart failure: Secondary | ICD-10-CM | POA: Diagnosis not present

## 2014-10-31 ENCOUNTER — Other Ambulatory Visit (HOSPITAL_COMMUNITY): Payer: Self-pay

## 2014-10-31 ENCOUNTER — Ambulatory Visit (HOSPITAL_COMMUNITY)
Admission: RE | Admit: 2014-10-31 | Discharge: 2014-10-31 | Disposition: A | Discharge status: Home / Self Care | Payer: BLUE CROSS/BLUE SHIELD | Source: Ambulatory Visit | Attending: Cardiology | Admitting: Cardiology

## 2014-10-31 DIAGNOSIS — I5020 Unspecified systolic (congestive) heart failure: Secondary | ICD-10-CM | POA: Diagnosis not present

## 2014-10-31 NOTE — Progress Notes (Signed)
Echocardiogram 2D Echocardiogram has been performed.  Tresa Res 10/31/2014, 1:51 PM

## 2014-11-01 ENCOUNTER — Encounter (HOSPITAL_COMMUNITY)
Admission: RE | Admit: 2014-11-01 | Discharge: 2014-11-01 | Disposition: A | Discharge status: Home / Self Care | Payer: BLUE CROSS/BLUE SHIELD | Source: Ambulatory Visit | Attending: Cardiology | Admitting: Cardiology

## 2014-11-01 DIAGNOSIS — I5020 Unspecified systolic (congestive) heart failure: Secondary | ICD-10-CM | POA: Diagnosis not present

## 2014-11-03 ENCOUNTER — Encounter (HOSPITAL_COMMUNITY)
Admission: RE | Admit: 2014-11-03 | Discharge: 2014-11-03 | Disposition: A | Discharge status: Home / Self Care | Payer: BLUE CROSS/BLUE SHIELD | Source: Ambulatory Visit | Attending: Cardiology | Admitting: Cardiology

## 2014-11-03 DIAGNOSIS — I5020 Unspecified systolic (congestive) heart failure: Secondary | ICD-10-CM | POA: Diagnosis not present

## 2014-11-04 ENCOUNTER — Other Ambulatory Visit (HOSPITAL_COMMUNITY): Payer: Self-pay | Admitting: Anesthesiology

## 2014-11-04 ENCOUNTER — Other Ambulatory Visit: Payer: Self-pay | Admitting: Internal Medicine

## 2014-11-06 ENCOUNTER — Telehealth (HOSPITAL_COMMUNITY): Payer: Self-pay

## 2014-11-06 ENCOUNTER — Telehealth (HOSPITAL_COMMUNITY): Payer: Self-pay | Admitting: *Deleted

## 2014-11-06 ENCOUNTER — Encounter (HOSPITAL_COMMUNITY)
Admission: RE | Admit: 2014-11-06 | Discharge: 2014-11-06 | Disposition: A | Discharge status: Home / Self Care | Payer: BLUE CROSS/BLUE SHIELD | Source: Ambulatory Visit | Attending: Cardiology | Admitting: Cardiology

## 2014-11-06 NOTE — Telephone Encounter (Signed)
Maria from Cardiac Rehab called to report patient's SBP in 80's.  After elevating her feet and having her drink extra fluids, SBP in low 90's.  Patient reports feeling fine, no complaints or s/s.  Patient typically runs SBP in 90's for [name] in clinic.  Will advise to have her hold pm dose of Coreg.  Renee Pain

## 2014-11-06 NOTE — Progress Notes (Signed)
Blood pressure 94/66. Patient instructed to go and get water. Recheck blood pressure 86/54. I had [name] to sit down and put her feet up. Recheck blood pressure 88/57. The heart  Failure clinic called and notified of low BP spoke with Vilinda Blanks RN. Patient was instructed to hold her PM dose of coreg.Recheck blood pressure 90/56 then 91/56 sitting, standing blood pressure 107/60.  Patient did not exercise today. Will fax today's exercise flow sheets to Dr. Clayborne Dana office for review. [name] plans to return to exercise on Wednesday.

## 2014-11-08 ENCOUNTER — Encounter (HOSPITAL_COMMUNITY)
Admission: RE | Admit: 2014-11-08 | Discharge: 2014-11-08 | Disposition: A | Discharge status: Home / Self Care | Payer: BLUE CROSS/BLUE SHIELD | Source: Ambulatory Visit | Attending: Cardiology | Admitting: Cardiology

## 2014-11-08 ENCOUNTER — Other Ambulatory Visit: Payer: Self-pay | Admitting: General Surgery

## 2014-11-08 DIAGNOSIS — I5020 Unspecified systolic (congestive) heart failure: Secondary | ICD-10-CM | POA: Diagnosis not present

## 2014-11-08 NOTE — Progress Notes (Signed)
Patient exercised without complaints today. Will continue to monitor the patient throughout  the program.

## 2014-11-09 ENCOUNTER — Ambulatory Visit (HOSPITAL_COMMUNITY)
Admission: RE | Admit: 2014-11-09 | Discharge: 2014-11-09 | Disposition: A | Discharge status: Home / Self Care | Payer: BLUE CROSS/BLUE SHIELD | Source: Ambulatory Visit | Attending: General Surgery | Admitting: General Surgery

## 2014-11-09 ENCOUNTER — Encounter (HOSPITAL_COMMUNITY)
Admission: RE | Disposition: A | Discharge status: Home / Self Care | Payer: Self-pay | Source: Ambulatory Visit | Attending: General Surgery

## 2014-11-09 ENCOUNTER — Other Ambulatory Visit: Payer: Self-pay

## 2014-11-09 ENCOUNTER — Other Ambulatory Visit: Payer: Self-pay | Admitting: General Surgery

## 2014-11-09 DIAGNOSIS — I5020 Unspecified systolic (congestive) heart failure: Secondary | ICD-10-CM | POA: Diagnosis not present

## 2014-11-09 DIAGNOSIS — I517 Cardiomegaly: Secondary | ICD-10-CM | POA: Diagnosis not present

## 2014-11-09 DIAGNOSIS — Z1389 Encounter for screening for other disorder: Secondary | ICD-10-CM | POA: Diagnosis not present

## 2014-11-09 DIAGNOSIS — Z95810 Presence of automatic (implantable) cardiac defibrillator: Secondary | ICD-10-CM | POA: Diagnosis not present

## 2014-11-09 HISTORY — PX: BREATH TEK H PYLORI: SHX5422

## 2014-11-09 SURGERY — BREATH TEST, FOR HELICOBACTER PYLORI

## 2014-11-09 NOTE — Progress Notes (Signed)
   11/09/14 [place]  Referring MD Greer Pickerel  Time of Last PO Intake 2030  Baseline Breath At: 0821  Pranactin Given At: 8786  Post-Dose Breath At: 0836  Sample 1 4.0%  Sample 2 2.1%  Test Negative  Comments result of breath tek

## 2014-11-10 ENCOUNTER — Encounter (HOSPITAL_COMMUNITY): Payer: Self-pay | Admitting: General Surgery

## 2014-11-10 ENCOUNTER — Encounter: Payer: Self-pay | Admitting: Cardiology

## 2014-11-10 ENCOUNTER — Encounter (HOSPITAL_COMMUNITY)
Admission: RE | Admit: 2014-11-10 | Discharge: 2014-11-10 | Disposition: A | Discharge status: Home / Self Care | Payer: BLUE CROSS/BLUE SHIELD | Source: Ambulatory Visit | Attending: Cardiology | Admitting: Cardiology

## 2014-11-10 DIAGNOSIS — I5020 Unspecified systolic (congestive) heart failure: Secondary | ICD-10-CM | POA: Diagnosis not present

## 2014-11-13 ENCOUNTER — Encounter (HOSPITAL_COMMUNITY)
Admission: RE | Admit: 2014-11-13 | Discharge: 2014-11-13 | Disposition: A | Discharge status: Home / Self Care | Payer: BLUE CROSS/BLUE SHIELD | Source: Ambulatory Visit | Attending: Cardiology | Admitting: Cardiology

## 2014-11-13 DIAGNOSIS — I5020 Unspecified systolic (congestive) heart failure: Secondary | ICD-10-CM | POA: Diagnosis not present

## 2014-11-14 ENCOUNTER — Other Ambulatory Visit (HOSPITAL_COMMUNITY): Payer: Medicare Other

## 2014-11-14 ENCOUNTER — Ambulatory Visit (HOSPITAL_COMMUNITY): Payer: Medicare Other

## 2014-11-15 ENCOUNTER — Encounter: Payer: Self-pay | Admitting: Internal Medicine

## 2014-11-15 ENCOUNTER — Encounter (HOSPITAL_COMMUNITY): Payer: BLUE CROSS/BLUE SHIELD

## 2014-11-17 ENCOUNTER — Encounter (HOSPITAL_COMMUNITY)
Admission: RE | Admit: 2014-11-17 | Discharge: 2014-11-17 | Disposition: A | Discharge status: Home / Self Care | Payer: BLUE CROSS/BLUE SHIELD | Source: Ambulatory Visit | Attending: Cardiology | Admitting: Cardiology

## 2014-11-17 DIAGNOSIS — I5020 Unspecified systolic (congestive) heart failure: Secondary | ICD-10-CM | POA: Diagnosis not present

## 2014-11-20 ENCOUNTER — Encounter (HOSPITAL_COMMUNITY): Payer: BLUE CROSS/BLUE SHIELD

## 2014-11-22 ENCOUNTER — Encounter (HOSPITAL_COMMUNITY)
Admission: RE | Admit: 2014-11-22 | Discharge: 2014-11-22 | Disposition: A | Discharge status: Home / Self Care | Payer: BLUE CROSS/BLUE SHIELD | Source: Ambulatory Visit | Attending: Cardiology | Admitting: Cardiology

## 2014-11-22 DIAGNOSIS — I5020 Unspecified systolic (congestive) heart failure: Secondary | ICD-10-CM | POA: Diagnosis not present

## 2014-11-22 DIAGNOSIS — I5022 Chronic systolic (congestive) heart failure: Secondary | ICD-10-CM | POA: Insufficient documentation

## 2014-11-23 ENCOUNTER — Encounter: Payer: Self-pay | Admitting: Cardiology

## 2014-11-24 ENCOUNTER — Encounter (HOSPITAL_COMMUNITY)
Admission: RE | Admit: 2014-11-24 | Discharge: 2014-11-24 | Disposition: A | Discharge status: Home / Self Care | Payer: BLUE CROSS/BLUE SHIELD | Source: Ambulatory Visit | Attending: Cardiology | Admitting: Cardiology

## 2014-11-24 DIAGNOSIS — I5020 Unspecified systolic (congestive) heart failure: Secondary | ICD-10-CM | POA: Diagnosis not present

## 2014-11-24 NOTE — Progress Notes (Addendum)
[name] graduated from cardiac rehab program today with completion of 36 exercise sessions in Phase II. [name] maintained good attendance and progressed nicely during his participation in rehab as evidenced by increased MET level.   Medication list reconciled. Repeat  PHQ score =0 .  [name] says coming to cardiac rehab has gotten her out of an emotional rut.  [name] has made significant lifestyle changes and should be commended for her success. [name] feels he has achieved his goals during cardiac rehab.   [name] plans to continue exercise by walking and water aerobics. [name] is planning to have bariatric surgery in the future. We Are proud of Shatia's progress.

## 2014-11-27 ENCOUNTER — Encounter: Payer: BLUE CROSS/BLUE SHIELD | Attending: General Surgery | Admitting: Dietician

## 2014-11-27 ENCOUNTER — Encounter: Payer: Self-pay | Admitting: Dietician

## 2014-11-27 VITALS — Ht 64.0 in | Wt 286.7 lb

## 2014-11-27 DIAGNOSIS — E669 Obesity, unspecified: Secondary | ICD-10-CM | POA: Diagnosis present

## 2014-11-27 DIAGNOSIS — Z6841 Body Mass Index (BMI) 40.0 and over, adult: Secondary | ICD-10-CM | POA: Diagnosis not present

## 2014-11-27 DIAGNOSIS — Z713 Dietary counseling and surveillance: Secondary | ICD-10-CM | POA: Diagnosis not present

## 2014-11-27 NOTE — Progress Notes (Signed)
  Pre-Op Assessment Visit:  Pre-Operative Sleeve Gastrectomy Surgery  Medical Nutrition Therapy:  Appt start time: 264   End time:  320.  Patient was seen on 11/27/2014 for Pre-Operative Nutrition Assessment. Assessment and letter of approval faxed to [facility] Surgery Bariatric Surgery Program coordinator on 11/27/2014.   Preferred Learning Style:   No preference indicated   Learning Readiness:   Ready  Handouts given during visit include:  Pre-Op Goals Bariatric Surgery Protein Shakes Pre op diet   Sample provided and patient instructed on proper use: Premier protein shake (strawberry - qty 1) Lot#: [id number] Exp: 02/2015  Premier protein shake (chocolate - qty 1) Lot#: [id number] Exp: 04/2015   During the appointment today the following Pre-Op Goals were reviewed with the patient: Maintain or lose weight as instructed by your surgeon Make healthy food choices Begin to limit portion sizes Limited concentrated sugars and fried foods Keep fat/sugar in the single digits per serving on   food labels Practice CHEWING your food  (aim for 30 chews per bite or until applesauce consistency) Practice not drinking 15 minutes before, during, and 30 minutes after each meal/snack Avoid all carbonated beverages  Avoid/limit caffeinated beverages  Avoid all sugar-sweetened beverages Consume 3 meals per day; eat every 3-5 hours Make a list of non-food related activities Aim for 64-100 ounces of FLUID daily  Aim for at least 60-80 grams of PROTEIN daily Look for a liquid protein source that contain ?15 g protein and ?5 g carbohydrate  (ex: shakes, drinks, shots)  Patient-Centered Goals: -Overall health -Heart health -Longer life  Scale of 1-10: confidence (10) /importance (10)  Demonstrated degree of understanding via:  Teach Back  Teaching Method Utilized:  Visual Auditory Hands on  Barriers to learning/adherence to lifestyle change: none  Patient to call the [facility] to enroll in Pre-Op and Post-Op Nutrition Education when surgery date is scheduled.

## 2014-11-27 NOTE — Patient Instructions (Signed)
Follow Pre-Op Goals Try Protein Shakes Call [facility] at [phone] when surgery is scheduled to enroll in Pre-Op Class  Things to remember:  Please always be honest with us. We want to support you!  If you have any questions or concerns in between appointments, please call or email Liz, Edouard Gikas, or Laurie.  The diet after surgery will be high protein and low in carbohydrate.  Vitamins and calcium need to be taken for the rest of your life.  Feel free to include support people in any classes or appointments.  

## 2014-12-01 ENCOUNTER — Other Ambulatory Visit (HOSPITAL_COMMUNITY): Payer: Self-pay | Admitting: Internal Medicine

## 2014-12-08 ENCOUNTER — Other Ambulatory Visit (HOSPITAL_COMMUNITY): Payer: Self-pay | Admitting: *Deleted

## 2014-12-08 MED ORDER — DIGOXIN 125 MCG PO TABS: 125.0000 ug | ORAL_TABLET | Freq: Every day | ORAL | Status: DC

## 2015-01-11 ENCOUNTER — Ambulatory Visit (INDEPENDENT_AMBULATORY_CARE_PROVIDER_SITE_OTHER): Payer: BLUE CROSS/BLUE SHIELD | Admitting: *Deleted

## 2015-01-11 DIAGNOSIS — I5022 Chronic systolic (congestive) heart failure: Secondary | ICD-10-CM | POA: Diagnosis not present

## 2015-01-11 DIAGNOSIS — I428 Other cardiomyopathies: Secondary | ICD-10-CM

## 2015-01-11 DIAGNOSIS — I429 Cardiomyopathy, unspecified: Secondary | ICD-10-CM

## 2015-01-11 NOTE — Progress Notes (Signed)
Remote ICD transmission.   

## 2015-01-23 ENCOUNTER — Encounter: Payer: Medicare Other | Admitting: Internal Medicine

## 2015-01-27 LAB — CUP PACEART REMOTE DEVICE CHECK
Battery Remaining Longevity: 133 mo
Battery Voltage: 3.04 V
Brady Statistic RV Percent Paced: 0.01 %
Date Time Interrogation Session: 20160721073324
HighPow Impedance: 79 Ohm
Lead Channel Impedance Value: 494 Ohm
Lead Channel Impedance Value: 646 Ohm
Lead Channel Pacing Threshold Amplitude: 0.625 V
Lead Channel Pacing Threshold Pulse Width: 0.4 ms
Lead Channel Sensing Intrinsic Amplitude: 10.75 mV
Lead Channel Sensing Intrinsic Amplitude: 10.75 mV
Lead Channel Setting Pacing Amplitude: 2 V
Lead Channel Setting Pacing Pulse Width: 0.4 ms
Lead Channel Setting Sensing Sensitivity: 0.3 mV
Zone Setting Detection Interval: 250 ms
Zone Setting Detection Interval: 300 ms
Zone Setting Detection Interval: 450 ms

## 2015-02-07 ENCOUNTER — Encounter: Payer: Self-pay | Admitting: Cardiology

## 2015-02-12 ENCOUNTER — Encounter: Payer: Self-pay | Admitting: Internal Medicine

## 2015-02-16 ENCOUNTER — Encounter: Payer: Self-pay | Admitting: Internal Medicine

## 2015-03-02 ENCOUNTER — Encounter (HOSPITAL_COMMUNITY): Payer: Self-pay

## 2015-03-02 ENCOUNTER — Ambulatory Visit (HOSPITAL_COMMUNITY)
Admission: RE | Admit: 2015-03-02 | Discharge: 2015-03-02 | Disposition: A | Discharge status: Home / Self Care | Payer: BLUE CROSS/BLUE SHIELD | Source: Ambulatory Visit | Attending: Cardiology | Admitting: Cardiology

## 2015-03-02 VITALS — BP 96/62 | HR 90 | Wt 277.5 lb

## 2015-03-02 DIAGNOSIS — I5022 Chronic systolic (congestive) heart failure: Secondary | ICD-10-CM

## 2015-03-02 DIAGNOSIS — Z95810 Presence of automatic (implantable) cardiac defibrillator: Secondary | ICD-10-CM | POA: Diagnosis not present

## 2015-03-02 DIAGNOSIS — I493 Ventricular premature depolarization: Secondary | ICD-10-CM | POA: Insufficient documentation

## 2015-03-02 DIAGNOSIS — G4733 Obstructive sleep apnea (adult) (pediatric): Secondary | ICD-10-CM | POA: Diagnosis not present

## 2015-03-02 DIAGNOSIS — I272 Other secondary pulmonary hypertension: Secondary | ICD-10-CM | POA: Insufficient documentation

## 2015-03-02 LAB — DIGOXIN LEVEL: Digoxin Level: <0.2 ng/mL — ABNORMAL LOW (ref 0.8–2.0)

## 2015-03-02 LAB — BASIC METABOLIC PANEL
Anion gap: 8 (ref 5–15)
BUN: 11 mg/dL (ref 6–20)
CO2: 24 mmol/L (ref 22–32)
Calcium: 9.1 mg/dL (ref 8.9–10.3)
Chloride: 105 mmol/L (ref 101–111)
Creatinine, Ser: 0.82 mg/dL (ref 0.44–1.00)
GFR calc Af Amer: >60 mL/min (ref >60)
GFR calc non Af Amer: >60 mL/min (ref >60)
Glucose, Bld: 92 mg/dL (ref 65–99)
Potassium: 3.6 mmol/L (ref 3.5–5.1)
Sodium: 137 mmol/L (ref 135–145)

## 2015-03-02 NOTE — Patient Instructions (Signed)
Your provider has requested you have a CPX. Will schedule you for Cardiopulmonary Exercise Test. This test is done at our [facility]. Please wear comfortable clothes and shoes for this test. Avoid heavy meal before the test (light snack/meal recommended). Avoid caffeine, alcohol, tobacco products 8 hrs before test. Please give 24 hr notice for cancellations/rescheduling: [phone].  Routine lab work today. (bmet dig) Will notify you of abnormal results  FOLLOW UP:3 months

## 2015-03-04 NOTE — Progress Notes (Signed)
Patient ID: LIA VIGILANTE, female   DOB: [birth year], 44 y.o.   MRN: [id number]  PCP: [facility] in [place]  HPI: [name] is a 44 y.o. female with morbid obesity, HTN, OSA and systolic HF due to nonischemic cardiomyopathy s/p Medtronic ICD (2006).  She was followed for systolic CHF initially in [place]. Apparently her EF recovered to 35-40%. However, she was admitted to [facility] 09/19/13 for worsening dyspnea and CP. Coronaries were reportedly OK on LHC at that time at [facility]. Echo showed EF of 15-20% with four-chamber enlargement with moderate-severe TR/MR. RHC as below. Rheumatological w/u negative  She has been walking daily.  She has lost 3 lbs.  She completed cardiac rehab.  She has decided against gastric sleeve surgery.  No dyspnea on flat ground and can walk up a flight of steps without problems.  She always elevates the head of her bed (long-term). No lightheadedness.  She is taking Lasix every other day.   RHC 09/24/13  RA 14  RV 57/22  PA 64/36 (48)  PCWP 18  Fick CO/CI: 3.7/1.6  PVR 8.1 WU  Ao sat 95%  PA sat 47%   CPX  11/09/13  FEV1 2.6L (85%)  FVC 2.1L (86%)  FEV1/FVC 86%  pVO2 14.8 (82%)  Slope 31.7%  RER 1.11  Ve/MVV 86%   Echo (8/15) with EF 20%, moderate LV dilation, diffuse hypokinesis, normal RV size with mildly decreased systolic function, mild MR.  Echo (5/16) with EF 20%, spherical LV with diffuse hypokinesis, mild MR.   Sleep study did not show significant OSA.   RHC (8/15): Mean RA 3 PA 33/11 mean 19 Mean PCWP 8 CI 2.35  Holter (8/15) with < 1% PVCs  Optivol was checked today.  Fluid index was less than threshold with stable impedance.    Labs: (11/09/13): Dig level 0.5, K 3.6, Creatinine 0.83, pro-BNP 622 (12/19/13): K 3.6, creatinine 0.8 (7/15): K 4.1, creatinine 0.91 (8/15): K 3.9, creatinine 0.91 (10/15): K 4, creatinine 0.93, proBNP 129 (12/15): K 4, creatinine 0.9, digoxin 0.4 (5/16): K 4, creatinine 0.86  SH: Disabled  since 2014. Lives her husband and 2 children. Does not drink alcohol or smoke   FH: Grandmother had heart failure   ROS: All systems negative except as listed in HPI, PMH and Problem List.  Past Medical History  Diagnosis Date  . Hypertension   . Nonischemic cardiomyopathy   . Sinus tachycardia   . Nocturnal dyspnea   . Chronic CHF (congestive heart failure)     a. EF 15-20% b. RHC (09/2013) RA 14, RV 57/22, PA 64/36 (48), PCWP 18, FIck CO/CI 3.7/1.6, PVR 8.1 WU, PA sat 47%   . Morbid obesity   . Snoring-prob OSA 09/04/2011  . Sprint Fidelis ICD lead RECALL  [id number]   . Automatic implantable cardioverter-defibrillator in situ   . Myocardial infarction 08/2013  . Hypothyroidism   . History of stomach ulcers     Current Outpatient Prescriptions  Medication Sig Dispense Refill  . aspirin 81 MG tablet Take 81 mg by mouth daily.    Marland Kitchen atorvastatin (LIPITOR) 20 MG tablet Take 20 mg by mouth daily.    . carvedilol (COREG) 12.5 MG tablet TAKE ONE TABLET BY MOUTH TWICE A DAY WITH MEALS 60 tablet 4  . digoxin (LANOXIN) 0.125 MG tablet Take 1 tablet (125 mcg total) by mouth daily. 30 tablet 2  . furosemide (LASIX) 40 MG tablet TAKE TWO TABLETS BY MOUTH DAILY 60 tablet 3  .  levothyroxine (SYNTHROID, LEVOTHROID) 88 MCG tablet Take 88 mcg by mouth daily before breakfast.    . potassium chloride SA (KLOR-CON M20) 20 MEQ tablet Take 1 tablet (20 mEq total) by mouth daily. 90 tablet 3  . Sacubitril-Valsartan (ENTRESTO) 24-26 MG TABS Take 24-26 mg by mouth 2 (two) times daily. 60 tablet 3  . spironolactone (ALDACTONE) 25 MG tablet TAKE ONE TABLET BY MOUTH ONE TIME DAILY 30 tablet 4   No current facility-administered medications for this encounter.    Filed Vitals:   03/02/15 1044  BP: 96/62  Pulse: 90  Weight: 277 lb 8 oz (125.873 kg)  SpO2: 98%    PHYSICAL EXAM: General: Well appearing. No respiratory difficulty. Obese.  HEENT: normal  Neck: supple. JVP not elevated. Carotids 2+ bilat; no  bruits. No lymphadenopathy. Thyroid enlarged.   Cor: PMI nondisplaced. Regular rate & rhythm. No rubs, gallops or murmurs.  No edema.  Lungs: clear  Abdomen: Soft, nontender, nondistended. No hepatosplenomegaly. No bruits or masses. Good bowel sounds.  Extremities: no cyanosis, clubbing, rash. Neuro: alert & oriented x 3, cranial nerves grossly intact. moves all 4 extremities w/o difficulty. Affect pleasant.  ASSESSMENT & PLAN:  1. Chronic systolic HF: Nonischemic cardiomyopathy.  5/16 echo showed EF 20% with diffuse hypokinesis.  She has a Medtronic ICD.  Cause for cardiomyopathy is uncertain: initially noted peri-partum (after son's birth), so cannot rule out peri-partum CMP.  She was noted at one point to have frequent PVCs but holter showed <1% PVCs.   RHC in 8/15 showed normal filling pressures and preserved cardiac output.  She does not qualify for CRT with narrow QRS.  NYHA II, generally doing well despite low EF.  - Continue current Coreg, spironolactone, digoxin, and Entresto.  I will not titrate up Coreg or Entresto because SBP runs in the 90s.   - She can continue taking Lasix every other day.  - I will arrange for CPX.  - BMET/digoxin level today.  2. Pulmonary HTN: Mixed picture with PVR 8.1 on RHC at [facility].  However, repeat study in 8/15 here showed no significant pulmonary hypertension.  3. Morbid obesity: Has decided against gastric sleeve.  4. OSA:  Surprisingly, no significant OSA on sleep study.  5. PVCs: Holter monitor showed < 1% PVCs.  Suspect that this does not contribute to the cardiomyopathy significantly. Check BMET and mag level today.   [name] 03/04/2015

## 2015-03-12 ENCOUNTER — Ambulatory Visit (HOSPITAL_COMMUNITY): Payer: BLUE CROSS/BLUE SHIELD | Attending: Cardiology

## 2015-03-12 DIAGNOSIS — I5022 Chronic systolic (congestive) heart failure: Secondary | ICD-10-CM | POA: Diagnosis present

## 2015-03-13 ENCOUNTER — Other Ambulatory Visit (HOSPITAL_COMMUNITY): Payer: Self-pay | Admitting: Internal Medicine

## 2015-03-14 DIAGNOSIS — I5022 Chronic systolic (congestive) heart failure: Secondary | ICD-10-CM | POA: Diagnosis not present

## 2015-04-02 ENCOUNTER — Encounter: Payer: Medicare Other | Admitting: Internal Medicine

## 2015-04-05 ENCOUNTER — Ambulatory Visit (INDEPENDENT_AMBULATORY_CARE_PROVIDER_SITE_OTHER): Payer: BLUE CROSS/BLUE SHIELD | Admitting: Internal Medicine

## 2015-04-05 ENCOUNTER — Encounter: Payer: Self-pay | Admitting: Cardiology

## 2015-04-05 ENCOUNTER — Encounter: Payer: Self-pay | Admitting: Internal Medicine

## 2015-04-05 VITALS — BP 100/60 | HR 79 | Ht 64.0 in | Wt 279.0 lb

## 2015-04-05 DIAGNOSIS — I428 Other cardiomyopathies: Secondary | ICD-10-CM

## 2015-04-05 DIAGNOSIS — I429 Cardiomyopathy, unspecified: Secondary | ICD-10-CM | POA: Diagnosis not present

## 2015-04-05 DIAGNOSIS — I5022 Chronic systolic (congestive) heart failure: Secondary | ICD-10-CM | POA: Diagnosis not present

## 2015-04-05 LAB — CUP PACEART INCLINIC DEVICE CHECK
Battery Remaining Longevity: 132 mo
Battery Voltage: 3.03 V
Brady Statistic RV Percent Paced: 0.01 %
Date Time Interrogation Session: 20161013120316
HighPow Impedance: 80 Ohm
Implantable Lead Implant Date: 20150706
Implantable Lead Location: 753860
Lead Channel Impedance Value: 513 Ohm
Lead Channel Impedance Value: 665 Ohm
Lead Channel Pacing Threshold Amplitude: 0.75 V
Lead Channel Pacing Threshold Pulse Width: 0.4 ms
Lead Channel Sensing Intrinsic Amplitude: 10.125 mV
Lead Channel Sensing Intrinsic Amplitude: 11.625 mV
Lead Channel Setting Pacing Amplitude: 2 V
Lead Channel Setting Pacing Pulse Width: 0.4 ms
Lead Channel Setting Sensing Sensitivity: 0.3 mV
Zone Setting Detection Interval: 250 ms
Zone Setting Detection Interval: 300 ms

## 2015-04-05 NOTE — Patient Instructions (Addendum)
Medication Instructions: - no changes  Labwork: - none  Procedures/Testing: - none  Follow-Up: - Remote monitoring is used to monitor your Pacemaker of ICD from home. This monitoring reduces the number of office visits required to check your device to one time per year. It allows [name] to keep an eye on the functioning of your device to ensure it is working properly. You are scheduled for a device check from home on 07/05/15. You may send your transmission at any time that day. If you have a wireless device, the transmission will be sent automatically. After your physician reviews your transmission, you will receive a postcard with your next transmission date.  - Your physician wants you to follow-up in: 1 year with Chanetta Marshall, NP for Dr. [name]. You will receive a reminder letter in the mail two months in advance. If you don't receive a letter, please call our office to schedule the follow-up appointment.  Any Additional Special Instructions Will Be Listed Below (If Applicable). - none

## 2015-04-05 NOTE — Progress Notes (Signed)
Patient Care Team: [name], MD as PCP - General (Family Medicine)   HPI  [name] is a 44 y.o. female Seen in followup for defibrillator implanted for nonischemic myopathy   She underwent replacement of the generator with new RV lead replacing [id number] 7/15  Because on going problems with heart failure at her last visit she requested referral to the heart failure clinic. She has seen Dr. DM. The history of PVCs she underwent Holter monitoring demonstrating less than 1% of PVCs.  Since discharge she has been feeling some better.     Echo (8/15) with EF 20%, moderate LV dilation, diffuse hypokinesis, normal RV size with mildly decreased systolic function, mild MR.  Echo (5/16) with EF 20%, spherical LV with diffuse hypokinesis, mild MR.  CPX  9/16 Exercise testing with gas-exchange demonstrates a normal functional capacity when compared to matched sedentary norms. Patient does not appear to have significant ventilatory or circulatory limitations. The patient's body habitus appears to be the primary limitation to exercise.    Past Medical History  Diagnosis Date  . Hypertension   . Nonischemic cardiomyopathy ([place])   . Sinus tachycardia ([place])   . Nocturnal dyspnea   . Chronic CHF (congestive heart failure) (HCC)     a. EF 15-20% b. RHC (09/2013) RA 14, RV 57/22, PA 64/36 (48), PCWP 18, FIck CO/CI 3.7/1.6, PVR 8.1 WU, PA sat 47%   . Morbid obesity ([place])   . Snoring-prob OSA 09/04/2011  . Sprint Fidelis ICD lead RECALL  [id number]   . Automatic implantable cardioverter-defibrillator in situ   . Myocardial infarction ([place]) 08/2013  . Hypothyroidism   . History of stomach ulcers     Past Surgical History  Procedure Laterality Date  . Cardiac defibrillator placement  2006; 12/26/2013    Medtronic [id number]; 12-2013 ICD gen change and RV lead revision with new 6935 RV lead by Dr [name]  . Cesarean section  1999  . Tubal ligation  1999  . Cardiac catheterization  ~  2006; 09/2013  . Implantable cardioverter defibrillator generator change N/A 12/26/2013    Procedure: IMPLANTABLE CARDIOVERTER DEFIBRILLATOR GENERATOR CHANGE;  Surgeon: Deboraha Sprang, MD;  Location: [facility] CATH LAB;  Service: Cardiovascular;  Laterality: N/A;  . Lead revision N/A 12/26/2013    Procedure: LEAD REVISION;  Surgeon: Deboraha Sprang, MD;  Location: [facility] CATH LAB;  Service: Cardiovascular;  Laterality: N/A;  . Right heart catheterization N/A 02/15/2014    Procedure: RIGHT HEART CATH;  Surgeon: Larey Dresser, MD;  Location: [facility] CATH LAB;  Service: Cardiovascular;  Laterality: N/A;  . Breath tek h pylori N/A 11/09/2014    Procedure: BREATH TEK H PYLORI;  Surgeon: Greer Pickerel, MD;  Location: [facility] ENDOSCOPY;  Service: General;  Laterality: N/A;    Current Outpatient Prescriptions  Medication Sig Dispense Refill  . aspirin 81 MG tablet Take 81 mg by mouth daily.    Marland Kitchen atorvastatin (LIPITOR) 20 MG tablet Take 20 mg by mouth daily.    . carvedilol (COREG) 12.5 MG tablet TAKE ONE TABLET BY MOUTH TWICE A DAY WITH MEALS 60 tablet 4  . digoxin (LANOXIN) 0.125 MG tablet TAKE 1 TABLET (125 MCG TOTAL) BY MOUTH DAILY. 30 tablet 2  . furosemide (LASIX) 40 MG tablet TAKE TWO TABLETS BY MOUTH DAILY 60 tablet 3  . levothyroxine (SYNTHROID, LEVOTHROID) 88 MCG tablet Take 88 mcg by mouth daily before breakfast.    . potassium chloride SA (KLOR-CON M20) 20 MEQ  tablet Take 1 tablet (20 mEq total) by mouth daily. 90 tablet 3  . Sacubitril-Valsartan (ENTRESTO) 24-26 MG TABS Take 24-26 mg by mouth 2 (two) times daily. 60 tablet 3  . spironolactone (ALDACTONE) 25 MG tablet TAKE ONE TABLET BY MOUTH ONE TIME DAILY 30 tablet 4   No current facility-administered medications for this visit.    No Known Allergies  Review of Systems negative except from HPI and PMH  Physical Exam BP 100/60 mmHg  Pulse 79  Ht 5\' 4"  (1.626 m)  Wt 279 lb (126.554 kg)  BMI 47.87 kg/m2 Well developed and well nourished in no acute  distress HENT normal E scleral and icterus clear Neck Supple JVP can  not disecern carotids brisk and full Clear to ausculation  Regular rate and rhythm, Soft with active bowel sounds No clubbing cyanosis Trace Edema Alert and oriented, grossly normal motor and sensory function Skin Warm and Dry  ECG demonstrates sinus rhythm  at 79 with interpolated PVC Intervals 17/11/39    Assessment and  Plan  Nonischemic cardiac myopathy  Systolic heart failure-chronic    Implantable defibrillator-Medtronic  The patient's device was interrogated and the information was fully reviewed.  The device was reprogrammed to  Maximize longevity  6949-lead replaced   PVCs   MR/TR  Cardiac-wise she is stable. She is in the midst of her mom going to hospice today. 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

## 2015-04-24 DIAGNOSIS — I10 Essential (primary) hypertension: Secondary | ICD-10-CM | POA: Diagnosis not present

## 2015-04-24 DIAGNOSIS — Z1239 Encounter for other screening for malignant neoplasm of breast: Secondary | ICD-10-CM | POA: Diagnosis not present

## 2015-04-24 DIAGNOSIS — I509 Heart failure, unspecified: Secondary | ICD-10-CM | POA: Diagnosis not present

## 2015-04-24 DIAGNOSIS — Z0000 Encounter for general adult medical examination without abnormal findings: Secondary | ICD-10-CM | POA: Diagnosis not present

## 2015-04-24 DIAGNOSIS — E785 Hyperlipidemia, unspecified: Secondary | ICD-10-CM | POA: Diagnosis not present

## 2015-04-24 DIAGNOSIS — Z23 Encounter for immunization: Secondary | ICD-10-CM | POA: Diagnosis not present

## 2015-04-24 DIAGNOSIS — E039 Hypothyroidism, unspecified: Secondary | ICD-10-CM | POA: Diagnosis not present

## 2015-04-30 ENCOUNTER — Other Ambulatory Visit (HOSPITAL_COMMUNITY): Payer: Self-pay | Admitting: *Deleted

## 2015-04-30 MED ORDER — SACUBITRIL-VALSARTAN 24-26 MG PO TABS: 1.0000 | ORAL_TABLET | Freq: Two times a day (BID) | ORAL | Status: DC

## 2015-05-01 ENCOUNTER — Other Ambulatory Visit: Payer: Self-pay | Admitting: Family Medicine

## 2015-05-01 DIAGNOSIS — Z1231 Encounter for screening mammogram for malignant neoplasm of breast: Secondary | ICD-10-CM

## 2015-05-03 ENCOUNTER — Other Ambulatory Visit (HOSPITAL_COMMUNITY): Payer: Self-pay | Admitting: *Deleted

## 2015-05-09 ENCOUNTER — Ambulatory Visit
Admission: RE | Admit: 2015-05-09 | Discharge: 2015-05-09 | Disposition: A | Discharge status: Home / Self Care | Payer: BLUE CROSS/BLUE SHIELD | Source: Ambulatory Visit | Attending: Family Medicine | Admitting: Family Medicine

## 2015-05-09 DIAGNOSIS — Z1231 Encounter for screening mammogram for malignant neoplasm of breast: Secondary | ICD-10-CM

## 2015-07-05 ENCOUNTER — Ambulatory Visit (INDEPENDENT_AMBULATORY_CARE_PROVIDER_SITE_OTHER): Payer: BLUE CROSS/BLUE SHIELD | Admitting: *Deleted

## 2015-07-05 DIAGNOSIS — I429 Cardiomyopathy, unspecified: Secondary | ICD-10-CM | POA: Diagnosis not present

## 2015-07-05 DIAGNOSIS — I428 Other cardiomyopathies: Secondary | ICD-10-CM

## 2015-07-05 NOTE — Progress Notes (Signed)
Remote ICD transmission.   

## 2015-07-13 LAB — CUP PACEART REMOTE DEVICE CHECK
Battery Remaining Longevity: 130 mo
Battery Voltage: 3.03 V
Brady Statistic RV Percent Paced: 0.01 %
Date Time Interrogation Session: 20170112093824
HighPow Impedance: 81 Ohm
Implantable Lead Implant Date: 20150706
Implantable Lead Location: 753860
Lead Channel Impedance Value: 494 Ohm
Lead Channel Impedance Value: 608 Ohm
Lead Channel Pacing Threshold Amplitude: 0.625 V
Lead Channel Pacing Threshold Pulse Width: 0.4 ms
Lead Channel Sensing Intrinsic Amplitude: 11.25 mV
Lead Channel Sensing Intrinsic Amplitude: 11.25 mV
Lead Channel Setting Pacing Amplitude: 2 V
Lead Channel Setting Pacing Pulse Width: 0.4 ms
Lead Channel Setting Sensing Sensitivity: 0.3 mV

## 2015-07-20 ENCOUNTER — Encounter: Payer: Self-pay | Admitting: Cardiology

## 2015-07-23 ENCOUNTER — Encounter: Payer: Self-pay | Admitting: Cardiology

## 2015-08-03 ENCOUNTER — Encounter: Payer: Self-pay | Admitting: Cardiology

## 2015-10-04 ENCOUNTER — Ambulatory Visit (INDEPENDENT_AMBULATORY_CARE_PROVIDER_SITE_OTHER): Payer: BLUE CROSS/BLUE SHIELD | Admitting: *Deleted

## 2015-10-04 DIAGNOSIS — I428 Other cardiomyopathies: Secondary | ICD-10-CM

## 2015-10-04 DIAGNOSIS — I429 Cardiomyopathy, unspecified: Secondary | ICD-10-CM

## 2015-10-08 NOTE — Progress Notes (Signed)
Remote ICD transmission.   

## 2015-10-26 ENCOUNTER — Ambulatory Visit (HOSPITAL_COMMUNITY)
Admission: RE | Admit: 2015-10-26 | Discharge: 2015-10-26 | Disposition: A | Discharge status: Home / Self Care | Payer: BLUE CROSS/BLUE SHIELD | Source: Ambulatory Visit | Attending: Cardiology | Admitting: Cardiology

## 2015-10-26 ENCOUNTER — Encounter (HOSPITAL_COMMUNITY): Payer: Self-pay

## 2015-10-26 VITALS — BP 98/76 | HR 85 | Wt 280.8 lb

## 2015-10-26 DIAGNOSIS — Z7982 Long term (current) use of aspirin: Secondary | ICD-10-CM | POA: Insufficient documentation

## 2015-10-26 DIAGNOSIS — I272 Other secondary pulmonary hypertension: Secondary | ICD-10-CM | POA: Insufficient documentation

## 2015-10-26 DIAGNOSIS — G4733 Obstructive sleep apnea (adult) (pediatric): Secondary | ICD-10-CM | POA: Insufficient documentation

## 2015-10-26 DIAGNOSIS — I5022 Chronic systolic (congestive) heart failure: Secondary | ICD-10-CM | POA: Insufficient documentation

## 2015-10-26 DIAGNOSIS — Z95810 Presence of automatic (implantable) cardiac defibrillator: Secondary | ICD-10-CM | POA: Insufficient documentation

## 2015-10-26 DIAGNOSIS — Z8249 Family history of ischemic heart disease and other diseases of the circulatory system: Secondary | ICD-10-CM | POA: Diagnosis not present

## 2015-10-26 DIAGNOSIS — E039 Hypothyroidism, unspecified: Secondary | ICD-10-CM | POA: Diagnosis not present

## 2015-10-26 DIAGNOSIS — I428 Other cardiomyopathies: Secondary | ICD-10-CM | POA: Diagnosis not present

## 2015-10-26 DIAGNOSIS — Z79899 Other long term (current) drug therapy: Secondary | ICD-10-CM | POA: Insufficient documentation

## 2015-10-26 DIAGNOSIS — I252 Old myocardial infarction: Secondary | ICD-10-CM | POA: Insufficient documentation

## 2015-10-26 DIAGNOSIS — I110 Hypertensive heart disease with heart failure: Secondary | ICD-10-CM | POA: Insufficient documentation

## 2015-10-26 LAB — BASIC METABOLIC PANEL
Anion gap: 10 (ref 5–15)
BUN: 11 mg/dL (ref 6–20)
CO2: 23 mmol/L (ref 22–32)
Calcium: 9.6 mg/dL (ref 8.9–10.3)
Chloride: 106 mmol/L (ref 101–111)
Creatinine, Ser: 0.95 mg/dL (ref 0.44–1.00)
GFR calc Af Amer: >60 mL/min (ref >60)
GFR calc non Af Amer: >60 mL/min (ref >60)
Glucose, Bld: 86 mg/dL (ref 65–99)
Potassium: 4.1 mmol/L (ref 3.5–5.1)
Sodium: 139 mmol/L (ref 135–145)

## 2015-10-26 LAB — DIGOXIN LEVEL: Digoxin Level: 0.3 ng/mL — ABNORMAL LOW (ref 0.8–2.0)

## 2015-10-26 NOTE — Patient Instructions (Signed)
Routine lab work today. Will notify you of abnormal results, otherwise no news is good news!  Follow up 2 months with Dr. McLean.  Do the following things EVERYDAY: 1) Weigh yourself in the morning before breakfast. Write it down and keep it in a log. 2) Take your medicines as prescribed 3) Eat low salt foods-Limit salt (sodium) to 2000 mg per day.  4) Stay as active as you can everyday 5) Limit all fluids for the day to less than 2 liters  

## 2015-10-28 NOTE — Progress Notes (Signed)
Patient ID: DAVON SEYDEL, female   DOB: 11-22-1970, 45 y.o.   MRN: [id number]  PCP: [facility] in [place] Cardiology: Dr. [name]  HPI: [name] is a 45 y.o. female with morbid obesity, HTN, OSA and systolic HF due to nonischemic cardiomyopathy s/p Medtronic ICD (2006).  She was followed for systolic CHF initially in [place]. Apparently her EF recovered to 35-40%. However, she was admitted to [facility] 09/19/13 for worsening dyspnea and CP. Coronaries were reportedly OK on LHC at that time at [facility]. Echo showed EF of 15-20% with four-chamber enlargement with moderate-severe TR/MR. RHC as below. Rheumatological w/u negative.  CPX in 9/16 showed near-normal functional capacity.   She is generally doing well.  Weight is stable.  She walks 1/2-1 mile/day.  No exertional dyspnea walking on flat ground or going up 1 flight steps.  No orthopnea/PND.  Rare lightheadedness.    RHC 09/24/13  RA 14  RV 57/22  PA 64/36 (48)  PCWP 18  Fick CO/CI: 3.7/1.6  PVR 8.1 WU  Ao sat 95%  PA sat 47%   CPX  11/09/13  FEV1 2.6L (85%)  FVC 2.1L (86%)  FEV1/FVC 86%  pVO2 14.8 (82%)  Slope 31.7%  RER 1.11  Ve/MVV 86%   Echo (8/15) with EF 20%, moderate LV dilation, diffuse hypokinesis, normal RV size with mildly decreased systolic function, mild MR.  Echo (5/16) with EF 20%, spherical LV with diffuse hypokinesis, mild MR.   Sleep study did not show significant OSA.   RHC (8/15): Mean RA 3 PA 33/11 mean 19 Mean PCWP 8 CI 2.35  Holter (8/15) with < 1% PVCs  CPX 9/16 Peak VO2 16.2 VE/VCO2 slope 23.6 RER 1.25 Normal functional capacity  Labs: (11/09/13): Dig level 0.5, K 3.6, Creatinine 0.83, pro-BNP 622 (12/19/13): K 3.6, creatinine 0.8 (7/15): K 4.1, creatinine 0.91 (8/15): K 3.9, creatinine 0.91 (10/15): K 4, creatinine 0.93, proBNP 129 (12/15): K 4, creatinine 0.9, digoxin 0.4 (5/16): K 4, creatinine 0.86 (9/16): K 3.6, creatinine 0.86, digoxin < 0.2  SH: Disabled since  2014. Lives her husband and 2 children. Does not drink alcohol or smoke   FH: Grandmother had heart failure   ROS: All systems negative except as listed in HPI, PMH and Problem List.  Past Medical History  Diagnosis Date  . Hypertension   . Nonischemic cardiomyopathy ([place])   . Sinus tachycardia ([place])   . Nocturnal dyspnea   . Chronic CHF (congestive heart failure) (HCC)     a. EF 15-20% b. RHC (09/2013) RA 14, RV 57/22, PA 64/36 (48), PCWP 18, FIck CO/CI 3.7/1.6, PVR 8.1 WU, PA sat 47%   . Morbid obesity ([place])   . Snoring-prob OSA 09/04/2011  . Sprint Fidelis ICD lead RECALL  [id number]   . Automatic implantable cardioverter-defibrillator in situ   . Myocardial infarction ([place]) 08/2013  . Hypothyroidism   . History of stomach ulcers     Current Outpatient Prescriptions  Medication Sig Dispense Refill  . aspirin 81 MG tablet Take 81 mg by mouth daily.    Marland Kitchen atorvastatin (LIPITOR) 20 MG tablet Take 20 mg by mouth daily.    . carvedilol (COREG) 12.5 MG tablet TAKE ONE TABLET BY MOUTH TWICE A DAY WITH MEALS 60 tablet 4  . digoxin (LANOXIN) 0.125 MG tablet TAKE 1 TABLET (125 MCG TOTAL) BY MOUTH DAILY. 30 tablet 2  . furosemide (LASIX) 40 MG tablet TAKE TWO TABLETS BY MOUTH DAILY 60 tablet 3  . levothyroxine (  SYNTHROID, LEVOTHROID) 88 MCG tablet Take 88 mcg by mouth daily before breakfast.    . potassium chloride SA (KLOR-CON M20) 20 MEQ tablet Take 1 tablet (20 mEq total) by mouth daily. 90 tablet 3  . sacubitril-valsartan (ENTRESTO) 24-26 MG Take 1 tablet by mouth 2 (two) times daily. 60 tablet 3  . spironolactone (ALDACTONE) 25 MG tablet TAKE ONE TABLET BY MOUTH ONE TIME DAILY 30 tablet 4   No current facility-administered medications for this encounter.    Filed Vitals:   10/26/15 1518  BP: 98/76  Pulse: 85  Weight: 280 lb 12.8 oz (127.37 kg)  SpO2: 100%    PHYSICAL EXAM: General: Well appearing. No respiratory difficulty. Obese.  HEENT: normal  Neck: supple. JVP 7-8 cm. Carotids  2+ bilat; no bruits. No lymphadenopathy. Thyroid enlarged.   Cor: PMI nondisplaced. Regular rate & rhythm. No rubs, gallops or murmurs.  No edema.  Lungs: clear  Abdomen: Soft, nontender, nondistended. No hepatosplenomegaly. No bruits or masses. Good bowel sounds.  Extremities: no cyanosis, clubbing, rash. Neuro: alert & oriented x 3, cranial nerves grossly intact. moves all 4 extremities w/o difficulty. Affect pleasant.  ASSESSMENT & PLAN:  1. Chronic systolic HF: Nonischemic cardiomyopathy.  5/16 echo showed EF 20% with diffuse hypokinesis.  She has a Medtronic ICD.  Cause for cardiomyopathy is uncertain: initially noted peri-partum (after son's birth), so cannot rule out peri-partum CMP.  She was noted at one point to have frequent PVCs but holter showed <1% PVCs.   RHC in 8/15 showed normal filling pressures and preserved cardiac output.  She does not qualify for CRT with narrow QRS.  CPX in 9/16 with near-normal functional capacity.  NYHA II, generally doing well despite low EF.  - Continue current Coreg, spironolactone, digoxin, and Entresto.  I will not titrate up Coreg or Entresto because SBP runs in the 90s.  Check digoxin level today.  - Continue current Lasix, check BMET today.  - I will arrange for repeat echo to see if EF is improving.  2. Pulmonary HTN: Mixed picture with PVR 8.1 on RHC at [facility].  However, repeat study in 8/15 here showed no significant pulmonary hypertension.  3. Morbid obesity: Has decided against gastric sleeve.  4. OSA:  Surprisingly, no significant OSA on sleep study.   Followup in 3 months.  [name] 10/28/2015

## 2015-11-07 ENCOUNTER — Encounter: Payer: Self-pay | Admitting: Internal Medicine

## 2015-11-13 ENCOUNTER — Encounter: Payer: Self-pay | Admitting: Cardiology

## 2015-11-13 LAB — CUP PACEART REMOTE DEVICE CHECK
Battery Remaining Longevity: 128 mo
Battery Voltage: 3.03 V
Brady Statistic RV Percent Paced: 0.01 %
Date Time Interrogation Session: 20170413071604
HighPow Impedance: 79 Ohm
Implantable Lead Implant Date: 20150706
Implantable Lead Location: 753860
Lead Channel Impedance Value: 456 Ohm
Lead Channel Impedance Value: 608 Ohm
Lead Channel Pacing Threshold Amplitude: 0.375 V
Lead Channel Pacing Threshold Pulse Width: 0.4 ms
Lead Channel Sensing Intrinsic Amplitude: 10.875 mV
Lead Channel Sensing Intrinsic Amplitude: 10.875 mV
Lead Channel Setting Pacing Amplitude: 2 V
Lead Channel Setting Pacing Pulse Width: 0.4 ms
Lead Channel Setting Sensing Sensitivity: 0.3 mV

## 2015-12-05 ENCOUNTER — Telehealth (HOSPITAL_COMMUNITY): Payer: Self-pay | Admitting: Vascular Surgery

## 2015-12-05 DIAGNOSIS — I5022 Chronic systolic (congestive) heart failure: Secondary | ICD-10-CM

## 2015-12-05 NOTE — Telephone Encounter (Signed)
Pt called to make appt in July, she thought she needed an echo, there is no echo order in epic.Marland Kitchen Please advise

## 2015-12-06 ENCOUNTER — Telehealth (HOSPITAL_COMMUNITY): Payer: Self-pay | Admitting: Vascular Surgery

## 2015-12-06 ENCOUNTER — Encounter (HOSPITAL_COMMUNITY): Payer: Self-pay

## 2015-12-06 NOTE — Progress Notes (Signed)
Received 2nd medical record request from [place] dated 11/05/15, stating original dated 10/25/15. Do not have record of 1st request, and 2nd request does not specifiy what was 1st requested, so ALL records from CHF clinic and providers sent in mail to provided address (>100 pages). [facility] [place] [address] Copy of request scanned into patient's electronic medical record. Case # [id number]  Renee Pain

## 2015-12-06 NOTE — Telephone Encounter (Signed)
Left pt message to get Echo scheduled

## 2015-12-06 NOTE — Telephone Encounter (Signed)
Per Dr Claris Gladden last OV note:  - I will arrange for repeat echo to see if EF is improving.   Order placed, please sch, thanks

## 2015-12-13 ENCOUNTER — Ambulatory Visit (HOSPITAL_COMMUNITY)
Admission: RE | Admit: 2015-12-13 | Discharge: 2015-12-13 | Disposition: A | Discharge status: Home / Self Care | Payer: BLUE CROSS/BLUE SHIELD | Source: Ambulatory Visit | Attending: Internal Medicine | Admitting: Internal Medicine

## 2015-12-13 DIAGNOSIS — Z6841 Body Mass Index (BMI) 40.0 and over, adult: Secondary | ICD-10-CM | POA: Diagnosis not present

## 2015-12-13 DIAGNOSIS — I509 Heart failure, unspecified: Secondary | ICD-10-CM | POA: Diagnosis present

## 2015-12-13 DIAGNOSIS — I5189 Other ill-defined heart diseases: Secondary | ICD-10-CM | POA: Diagnosis not present

## 2015-12-13 DIAGNOSIS — I517 Cardiomegaly: Secondary | ICD-10-CM | POA: Diagnosis not present

## 2015-12-13 DIAGNOSIS — E669 Obesity, unspecified: Secondary | ICD-10-CM | POA: Insufficient documentation

## 2015-12-13 DIAGNOSIS — I340 Nonrheumatic mitral (valve) insufficiency: Secondary | ICD-10-CM | POA: Diagnosis not present

## 2015-12-13 DIAGNOSIS — I5022 Chronic systolic (congestive) heart failure: Secondary | ICD-10-CM | POA: Diagnosis not present

## 2015-12-13 LAB — ECHOCARDIOGRAM COMPLETE
E decel time: 215 msec
E/e' ratio: 12.56
FS: 6 % — AB (ref 28–44)
IVS/LV PW RATIO, ED: 1.05
LA ID, A-P, ES: 51 mm
LA diam end sys: 51 mm
LA diam index: 2.26 cm/m2
LA vol A4C: 149 ml
LA vol index: 58 mL/m2
LA vol: 131 mL
LV E/e' medial: 12.56
LV E/e'average: 12.56
LV PW d: 9.38 mm — AB (ref 0.6–1.1)
LV dias vol index: 99 mL/m2
LV dias vol: 224 mL — AB (ref 46–106)
LV e' LATERAL: 9.79 cm/s
LV sys vol index: 72 mL/m2
LV sys vol: 162 mL — AB (ref 14–42)
LVOT area: 3.8 cm2
LVOT diameter: 22 mm
MV Dec: 215
MV Peak grad: 6 mmHg
MV pk E vel: 123 m/s
Simpson's disk: 28
Stroke v: 62 ml
TDI e' lateral: 9.79
TDI e' medial: 6.2
VTI: 169 cm

## 2015-12-13 NOTE — Progress Notes (Signed)
Echocardiogram 2D Echocardiogram has been performed.  [name] 12/13/2015, 11:46 AM

## 2015-12-17 ENCOUNTER — Telehealth (HOSPITAL_COMMUNITY): Payer: Self-pay

## 2015-12-17 NOTE — Telephone Encounter (Signed)
Notes Recorded by Effie Berkshire, RN on 12/17/2015 at 9:00 AM Patient aware of results. Will forward info to [place] with research. Notes Recorded by Larey Dresser, MD on 12/16/2015 at 10:39 PM EF 20%, no change. Would report to patient and can have Desmond Dike screen for [employer] trial.

## 2016-01-03 ENCOUNTER — Ambulatory Visit (INDEPENDENT_AMBULATORY_CARE_PROVIDER_SITE_OTHER): Payer: BLUE CROSS/BLUE SHIELD | Admitting: *Deleted

## 2016-01-03 DIAGNOSIS — I428 Other cardiomyopathies: Secondary | ICD-10-CM

## 2016-01-03 DIAGNOSIS — I429 Cardiomyopathy, unspecified: Secondary | ICD-10-CM

## 2016-01-03 NOTE — Progress Notes (Signed)
Remote ICD transmission.   

## 2016-01-04 ENCOUNTER — Encounter: Payer: Self-pay | Admitting: Cardiology

## 2016-01-04 LAB — CUP PACEART REMOTE DEVICE CHECK
Battery Remaining Longevity: 126 mo
Battery Voltage: 3.02 V
Brady Statistic RV Percent Paced: 0.01 %
Date Time Interrogation Session: 20170713073623
HighPow Impedance: 69 Ohm
Implantable Lead Implant Date: 20150706
Implantable Lead Location: 753860
Lead Channel Impedance Value: 456 Ohm
Lead Channel Impedance Value: 551 Ohm
Lead Channel Pacing Threshold Amplitude: 0.375 V
Lead Channel Pacing Threshold Pulse Width: 0.4 ms
Lead Channel Sensing Intrinsic Amplitude: 9.625 mV
Lead Channel Sensing Intrinsic Amplitude: 9.625 mV
Lead Channel Setting Pacing Amplitude: 2 V
Lead Channel Setting Pacing Pulse Width: 0.4 ms
Lead Channel Setting Sensing Sensitivity: 0.3 mV

## 2016-01-07 ENCOUNTER — Ambulatory Visit (HOSPITAL_COMMUNITY)
Admission: RE | Admit: 2016-01-07 | Discharge: 2016-01-07 | Disposition: A | Discharge status: Home / Self Care | Payer: BLUE CROSS/BLUE SHIELD | Source: Ambulatory Visit | Attending: Cardiology | Admitting: Cardiology

## 2016-01-07 VITALS — BP 102/78 | HR 88 | Wt 286.5 lb

## 2016-01-07 DIAGNOSIS — I429 Cardiomyopathy, unspecified: Secondary | ICD-10-CM | POA: Diagnosis not present

## 2016-01-07 DIAGNOSIS — I272 Other secondary pulmonary hypertension: Secondary | ICD-10-CM | POA: Diagnosis not present

## 2016-01-07 DIAGNOSIS — Z7982 Long term (current) use of aspirin: Secondary | ICD-10-CM | POA: Diagnosis not present

## 2016-01-07 DIAGNOSIS — I252 Old myocardial infarction: Secondary | ICD-10-CM | POA: Insufficient documentation

## 2016-01-07 DIAGNOSIS — G4733 Obstructive sleep apnea (adult) (pediatric): Secondary | ICD-10-CM | POA: Insufficient documentation

## 2016-01-07 DIAGNOSIS — Z8719 Personal history of other diseases of the digestive system: Secondary | ICD-10-CM | POA: Insufficient documentation

## 2016-01-07 DIAGNOSIS — Z8249 Family history of ischemic heart disease and other diseases of the circulatory system: Secondary | ICD-10-CM | POA: Insufficient documentation

## 2016-01-07 DIAGNOSIS — E039 Hypothyroidism, unspecified: Secondary | ICD-10-CM | POA: Diagnosis not present

## 2016-01-07 DIAGNOSIS — E669 Obesity, unspecified: Secondary | ICD-10-CM | POA: Diagnosis not present

## 2016-01-07 DIAGNOSIS — R42 Dizziness and giddiness: Secondary | ICD-10-CM | POA: Insufficient documentation

## 2016-01-07 DIAGNOSIS — I493 Ventricular premature depolarization: Secondary | ICD-10-CM | POA: Diagnosis not present

## 2016-01-07 DIAGNOSIS — I110 Hypertensive heart disease with heart failure: Secondary | ICD-10-CM | POA: Insufficient documentation

## 2016-01-07 DIAGNOSIS — R000 Tachycardia, unspecified: Secondary | ICD-10-CM | POA: Insufficient documentation

## 2016-01-07 DIAGNOSIS — I5022 Chronic systolic (congestive) heart failure: Secondary | ICD-10-CM | POA: Insufficient documentation

## 2016-01-07 LAB — BASIC METABOLIC PANEL
Anion gap: 3 — ABNORMAL LOW (ref 5–15)
BUN: 8 mg/dL (ref 6–20)
CO2: 24 mmol/L (ref 22–32)
Calcium: 9.1 mg/dL (ref 8.9–10.3)
Chloride: 108 mmol/L (ref 101–111)
Creatinine, Ser: 0.93 mg/dL (ref 0.44–1.00)
GFR calc Af Amer: >60 mL/min (ref >60)
GFR calc non Af Amer: >60 mL/min (ref >60)
Glucose, Bld: 86 mg/dL (ref 65–99)
Potassium: 3.9 mmol/L (ref 3.5–5.1)
Sodium: 135 mmol/L (ref 135–145)

## 2016-01-07 MED ORDER — ATORVASTATIN CALCIUM 20 MG PO TABS: 20.0000 mg | ORAL_TABLET | Freq: Every day | ORAL | Status: DC

## 2016-01-07 MED ORDER — SACUBITRIL-VALSARTAN 24-26 MG PO TABS: 1.0000 | ORAL_TABLET | Freq: Two times a day (BID) | ORAL | Status: DC

## 2016-01-07 MED ORDER — POTASSIUM CHLORIDE CRYS ER 20 MEQ PO TBCR: 20.0000 meq | EXTENDED_RELEASE_TABLET | Freq: Every day | ORAL | Status: DC

## 2016-01-07 MED ORDER — FUROSEMIDE 40 MG PO TABS: 80.0000 mg | ORAL_TABLET | Freq: Every day | ORAL | Status: DC

## 2016-01-07 MED ORDER — CARVEDILOL 12.5 MG PO TABS: 12.5000 mg | ORAL_TABLET | Freq: Two times a day (BID) | ORAL | Status: DC

## 2016-01-07 MED ORDER — SPIRONOLACTONE 25 MG PO TABS: 25.0000 mg | ORAL_TABLET | Freq: Every day | ORAL | Status: DC

## 2016-01-07 MED ORDER — DIGOXIN 125 MCG PO TABS: ORAL_TABLET | ORAL | Status: DC

## 2016-01-07 NOTE — Progress Notes (Signed)
Patient ID: [name], female   DOB: 01/07/1971, 45 y.o.   MRN: [id number]  PCP: [facility] in [place] Cardiology: Dr. [name]  HPI: [name] is a 45 y.o. female with morbid obesity, HTN, OSA and systolic HF due to nonischemic cardiomyopathy s/p Medtronic ICD (2006).  She was followed for systolic CHF initially in [place]. Apparently her EF recovered to 35-40%. However, she was admitted to [facility] 09/19/13 for worsening dyspnea and CP. Coronaries were reportedly OK on LHC at that time at [facility]. Echo showed EF of 15-20% with four-chamber enlargement with moderate-severe TR/MR. RHC as below. Rheumatological w/u negative.  CPX in 9/16 showed near-normal functional capacity.  Repeat echo in 6/17 shows that EF remains 20%.   Weight is up about 6 lbs, but she reports less exercise now that it is hot outside.  She is short of breath after walking 100-200 yards or walking up a flight of steps.  No orthopnea/PND.  Occasional lightheadedness, no falls or syncope.  BP tends to run on the low side.  Optivol: fluid index < threshold, impedance stable.   RHC 09/24/13  RA 14  RV 57/22  PA 64/36 (48)  PCWP 18  Fick CO/CI: 3.7/1.6  PVR 8.1 WU  Ao sat 95%  PA sat 47%   CPX  11/09/13  FEV1 2.6L (85%)  FVC 2.1L (86%)  FEV1/FVC 86%  pVO2 14.8 (82%)  Slope 31.7%  RER 1.11  Ve/MVV 86%   Echo (8/15) with EF 20%, moderate LV dilation, diffuse hypokinesis, normal RV size with mildly decreased systolic function, mild MR.  Echo (5/16) with EF 20%, spherical LV with diffuse hypokinesis, mild MR.  Echo (6/17) with EF 20%, diffuse hypokinesis, moderate LV dilation, normal RV, mild to moderate MR.   Sleep study did not show significant OSA.   RHC (8/15): Mean RA 3 PA 33/11 mean 19 Mean PCWP 8 CI 2.35  Holter (8/15) with < 1% PVCs  CPX 9/16 Peak VO2 16.2 VE/VCO2 slope 23.6 RER 1.25 Normal functional capacity  Labs: (11/09/13): Dig level 0.5, K 3.6, Creatinine 0.83, pro-BNP  622 (12/19/13): K 3.6, creatinine 0.8 (7/15): K 4.1, creatinine 0.91 (8/15): K 3.9, creatinine 0.91 (10/15): K 4, creatinine 0.93, proBNP 129 (12/15): K 4, creatinine 0.9, digoxin 0.4 (5/16): K 4, creatinine 0.86 (9/16): K 3.6, creatinine 0.86, digoxin < 0.2 (5/17): K 4.1, creatinine 0.95, digoxin 0.3  SH: Disabled since 2014. Lives her husband and 2 children. Does not drink alcohol or smoke   FH: Grandmother had heart failure   ROS: All systems negative except as listed in HPI, PMH and Problem List.  Past Medical History  Diagnosis Date  . Hypertension   . Nonischemic cardiomyopathy ([place])   . Sinus tachycardia ([place])   . Nocturnal dyspnea   . Chronic CHF (congestive heart failure) (HCC)     a. EF 15-20% b. RHC (09/2013) RA 14, RV 57/22, PA 64/36 (48), PCWP 18, FIck CO/CI 3.7/1.6, PVR 8.1 WU, PA sat 47%   . Morbid obesity ([place])   . Snoring-prob OSA 09/04/2011  . Sprint Fidelis ICD lead RECALL  [id number]   . Automatic implantable cardioverter-defibrillator in situ   . Myocardial infarction ([place]) 08/2013  . Hypothyroidism   . History of stomach ulcers     Current Outpatient Prescriptions  Medication Sig Dispense Refill  . aspirin 81 MG tablet Take 81 mg by mouth daily.    Marland Kitchen atorvastatin (LIPITOR) 20 MG tablet Take 1 tablet (20 mg total)  by mouth daily. 60 tablet 6  . carvedilol (COREG) 12.5 MG tablet Take 1 tablet (12.5 mg total) by mouth 2 (two) times daily with a meal. 60 tablet 6  . digoxin (LANOXIN) 0.125 MG tablet TAKE 1 TABLET (125 MCG TOTAL) BY MOUTH DAILY. 30 tablet 6  . furosemide (LASIX) 40 MG tablet Take 2 tablets (80 mg total) by mouth daily. 60 tablet 6  . levothyroxine (SYNTHROID, LEVOTHROID) 88 MCG tablet Take 88 mcg by mouth daily before breakfast.    . potassium chloride SA (KLOR-CON M20) 20 MEQ tablet Take 1 tablet (20 mEq total) by mouth daily. 60 tablet 6  . sacubitril-valsartan (ENTRESTO) 24-26 MG Take 1 tablet by mouth 2 (two) times daily. 60 tablet 6  .  spironolactone (ALDACTONE) 25 MG tablet Take 1 tablet (25 mg total) by mouth daily. 30 tablet 6   No current facility-administered medications for this encounter.    Filed Vitals:   01/07/16 1454  BP: 102/78  Pulse: 88  Weight: 286 lb 8 oz (129.956 kg)  SpO2: 99%    PHYSICAL EXAM: General: Well appearing. No respiratory difficulty. Obese.  HEENT: normal  Neck: supple. JVP 7 cm. Carotids 2+ bilat; no bruits. No lymphadenopathy. Thyroid enlarged.   Cor: PMI nondisplaced. Regular rate & rhythm. No rubs, gallops or murmurs.  No edema.  Lungs: clear  Abdomen: Soft, nontender, nondistended. No hepatosplenomegaly. No bruits or masses. Good bowel sounds.  Extremities: no cyanosis, clubbing, rash. Neuro: alert & oriented x 3, cranial nerves grossly intact. moves all 4 extremities w/o difficulty. Affect pleasant.  ASSESSMENT & PLAN:  1. Chronic systolic HF: Nonischemic cardiomyopathy.  5/16 echo showed EF 20% with diffuse hypokinesis.  She has a Medtronic ICD.  Cause for cardiomyopathy is uncertain: initially noted peri-partum (after son's birth), so cannot rule out peri-partum CMP.  She was noted at one point to have frequent PVCs but holter showed <1% PVCs.   RHC in 8/15 showed normal filling pressures and preserved cardiac output.  She does not qualify for CRT with narrow QRS.  CPX in 9/16 with near-normal functional capacity.  Repeat echo in 6/17 showed EF still around 20%.  NYHA class II, she is not volume overloaded by exam or Optivol.  - Continue current Coreg, spironolactone, digoxin, and Entresto.  I will not titrate up Coreg or Entresto because SBP runs in the 90s at times and she has episodes of lightheadedness.   - Continue current Lasix, check BMET today.  - I think that she may be a good candidate for BEAT-HF (barostim trial), will connect her with our research staff.  2. Pulmonary HTN: Mixed picture with PVR 8.1 on RHC at [facility].  However, repeat study in 8/15 here showed no  significant pulmonary hypertension.  3. Morbid obesity: Has decided against gastric sleeve. Continue to work on weight loss. Needs to get back to exercise. 4. OSA:  Surprisingly, no significant OSA on sleep study.   Followup in 3 months.  [name] 01/07/2016

## 2016-01-07 NOTE — Patient Instructions (Signed)
Lab today  We will contact you in 3 months to schedule your next appointment.  

## 2016-01-09 ENCOUNTER — Encounter: Payer: Self-pay | Admitting: Cardiology

## 2016-01-23 DIAGNOSIS — Z736 Limitation of activities due to disability: Secondary | ICD-10-CM

## 2016-02-27 IMAGING — CR DG UGI W/ KUB
2 series · 2 of 2 positions shown · non-contrast
Comparison: None

CLINICAL DATA: Bariatric screening.  No difficulty swallowing.

EXAM:
UPPER GI SERIES WITH KUB
TECHNIQUE: After obtaining a scout radiograph a routine upper GI series was
performed using thin barium liquid
FLUOROSCOPY TIME:  Fluoroscopy Time (in minutes and seconds): 1
minutes 24 seconds

[abdomen supine (1 of 2)]
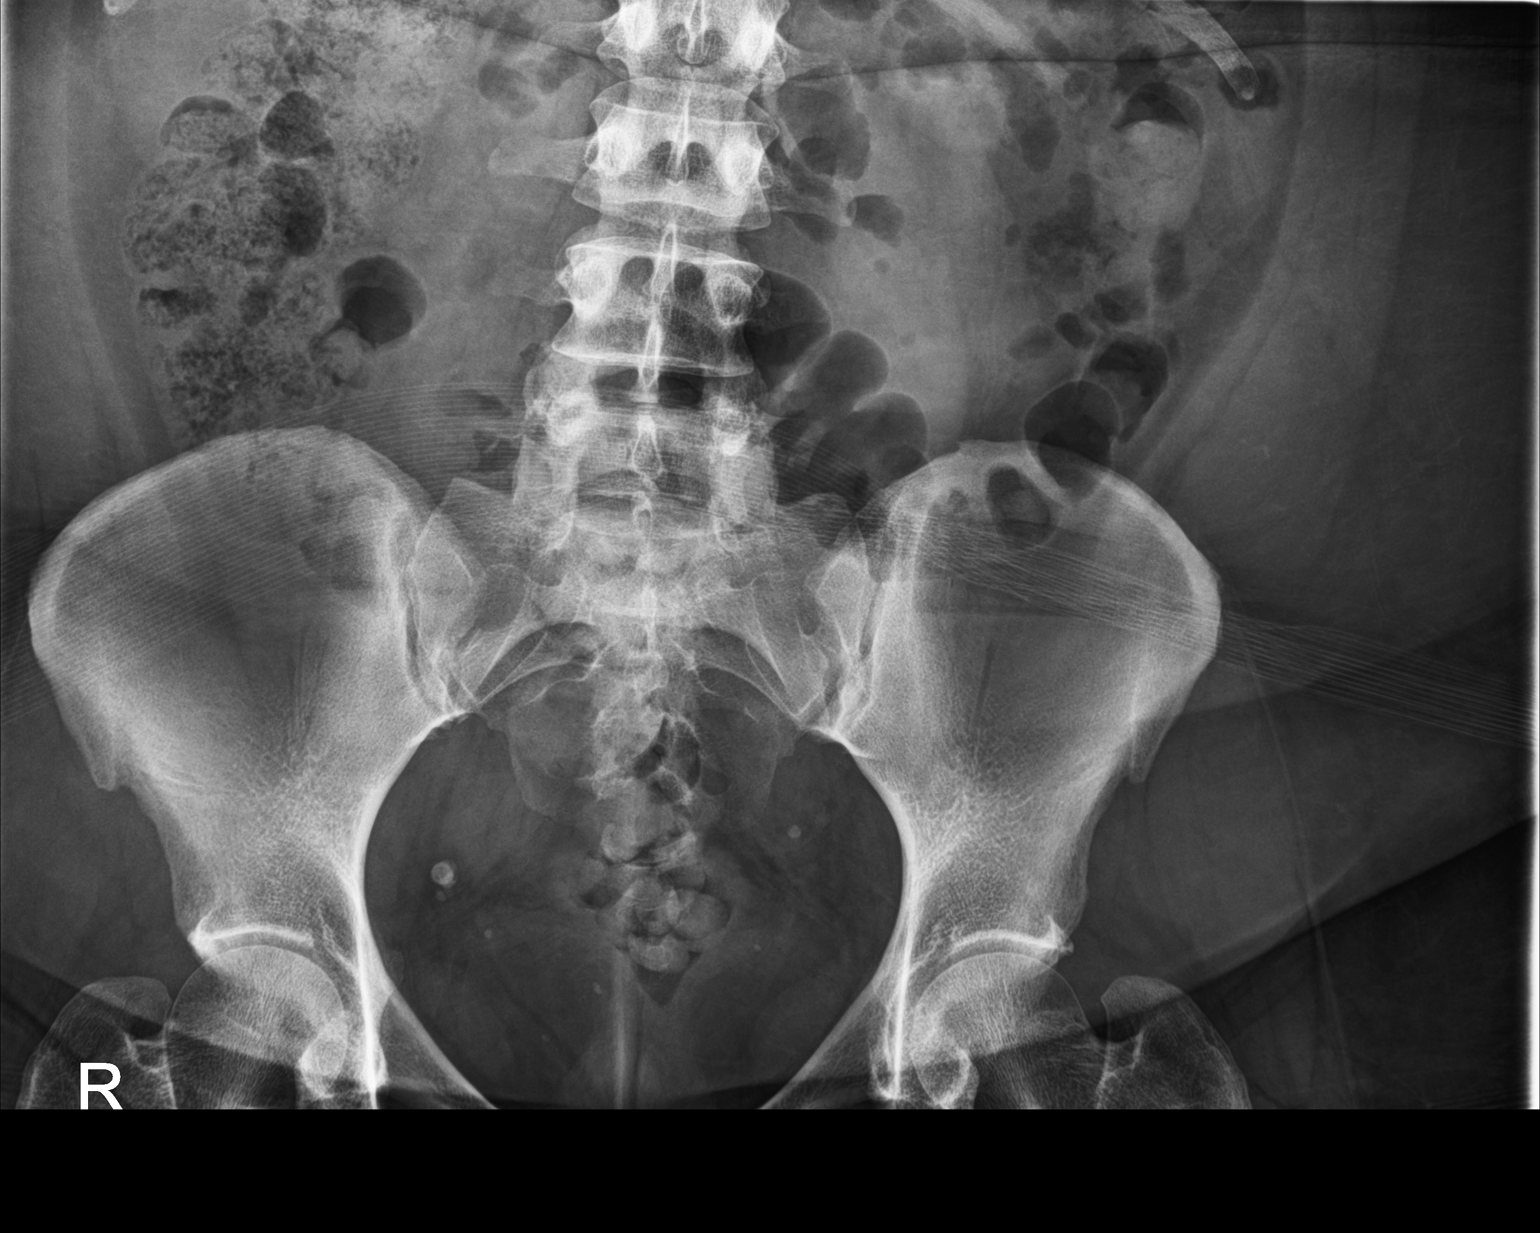

[abdomen supine (2 of 2)]
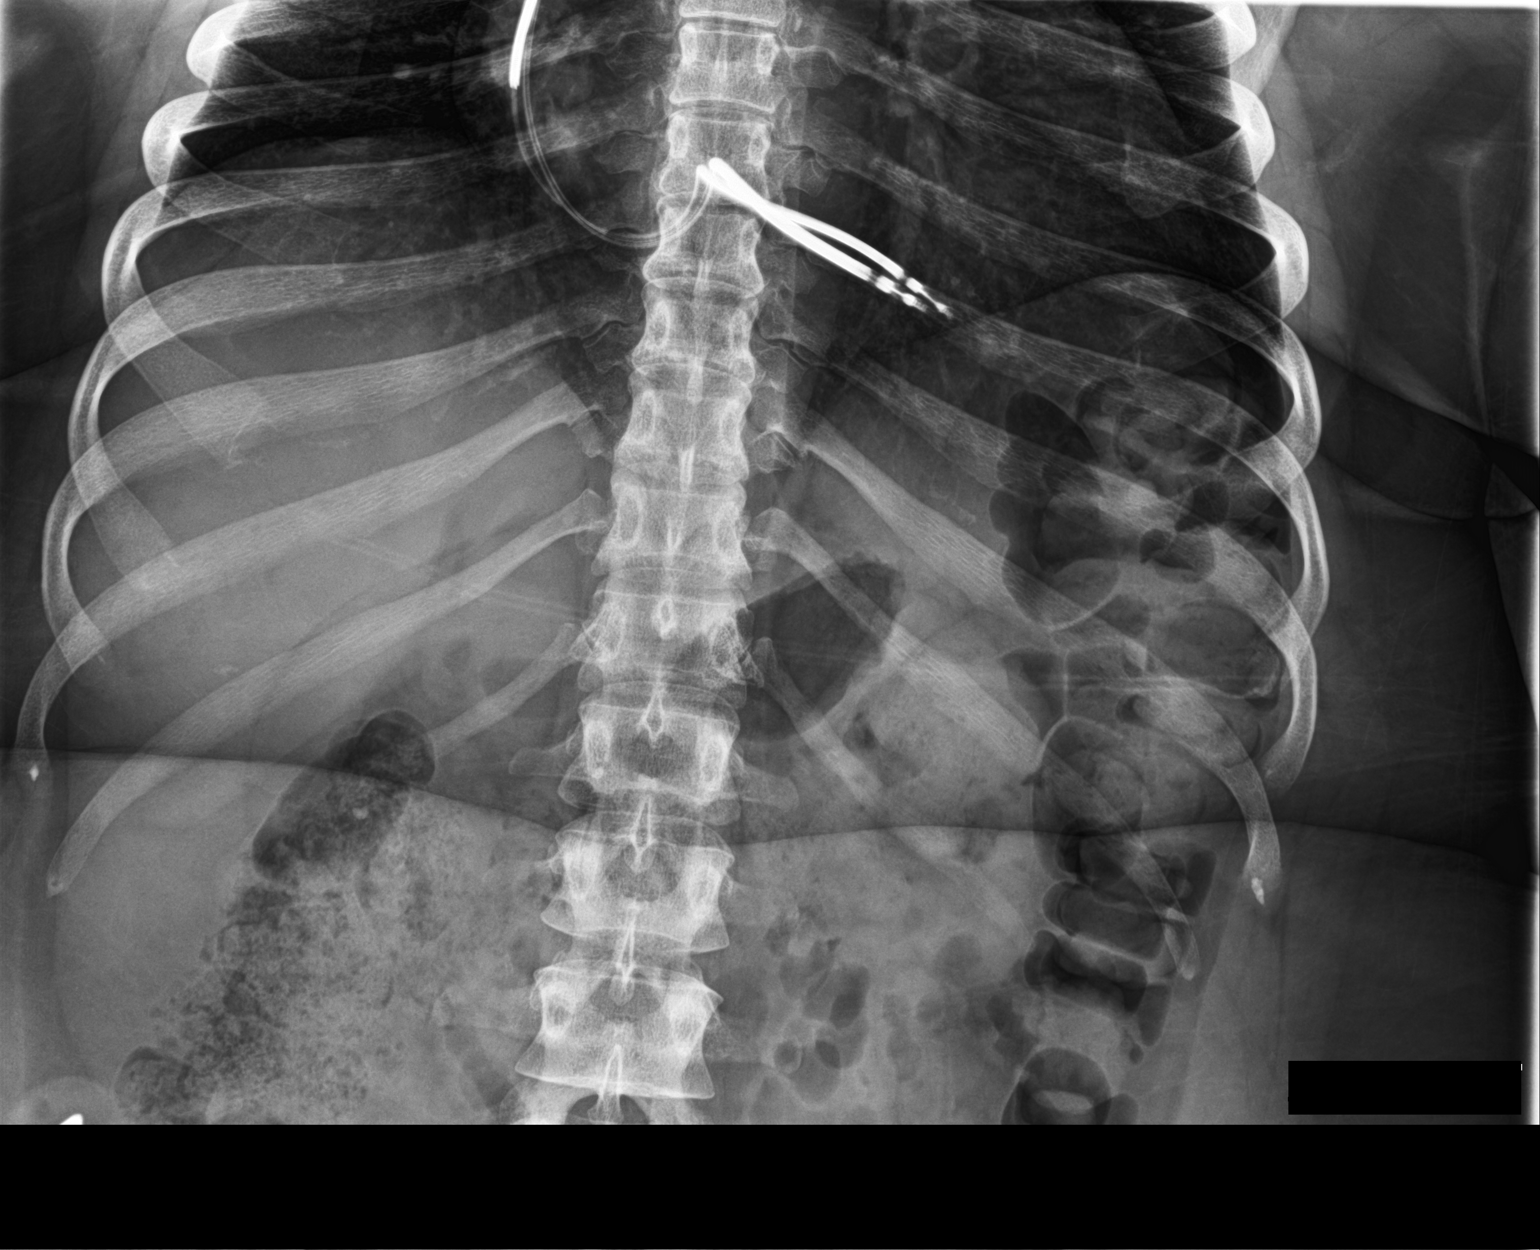

[2 of 2 positions shown; findings below may reference images not displayed]

FINDINGS: Scout film demonstrates pacing/ defibrillator leads overlying the
right ventricle. Bowel gas pattern is nonobstructive. No
organomegaly or abnormal calcifications.

With administration of contrast the esophageal peristalsis is
normal. The gastroesophageal junction opens normally. No evidence
for hiatal hernia or gastroesophageal reflux.

The gastric contour and rugal fold pattern are normal. Duodenal bulb
and sweep are normal. No evidence for malrotation.
IMPRESSION: Normal upper GI.

## 2016-02-27 IMAGING — US US ABDOMEN COMPLETE
1 series · 14 of 25 positions shown · non-contrast
Comparison: None.

CLINICAL DATA: Morbid obesity.

EXAM:
ULTRASOUND ABDOMEN COMPLETE

[Series 1: us abdomen complete · 14 of 91 slices shown]
[im 1/91]
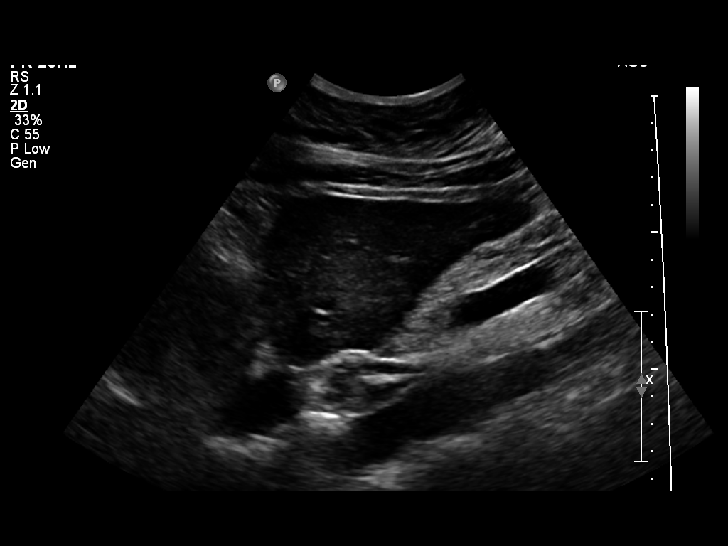
[im 8/91]
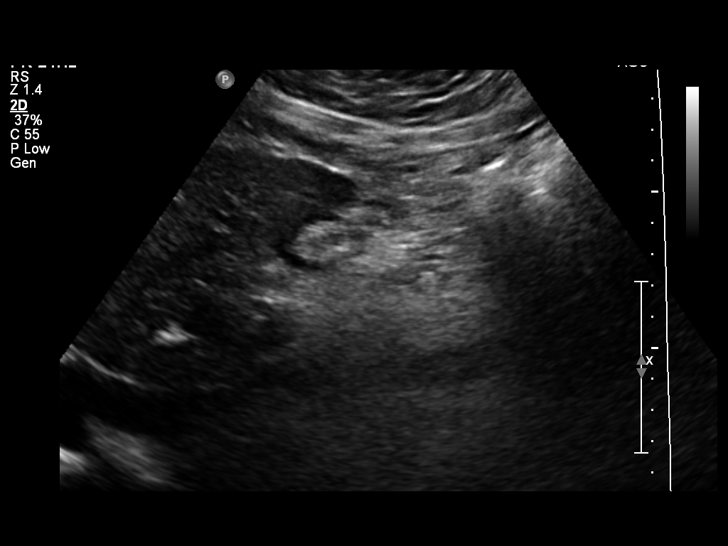
[im 16/91]
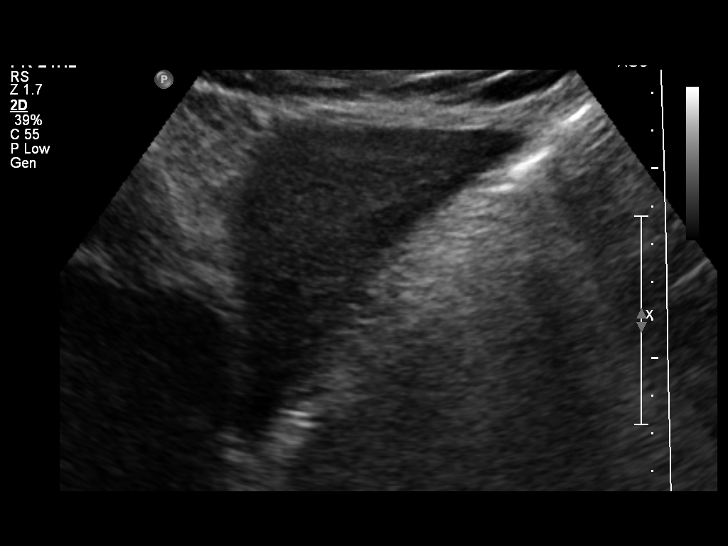
[im 23/91]
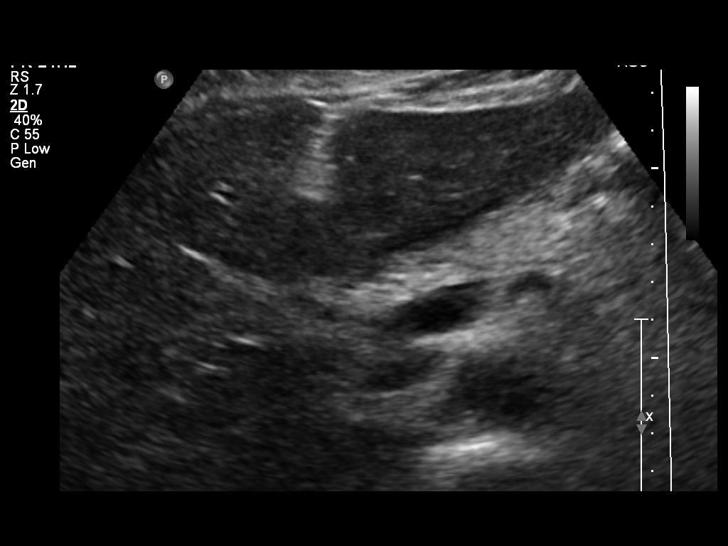
[im 31/91]
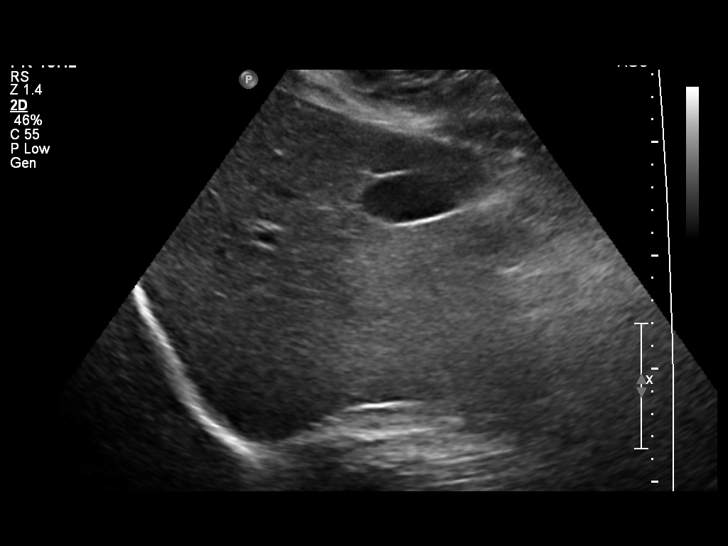
[im 34/91]
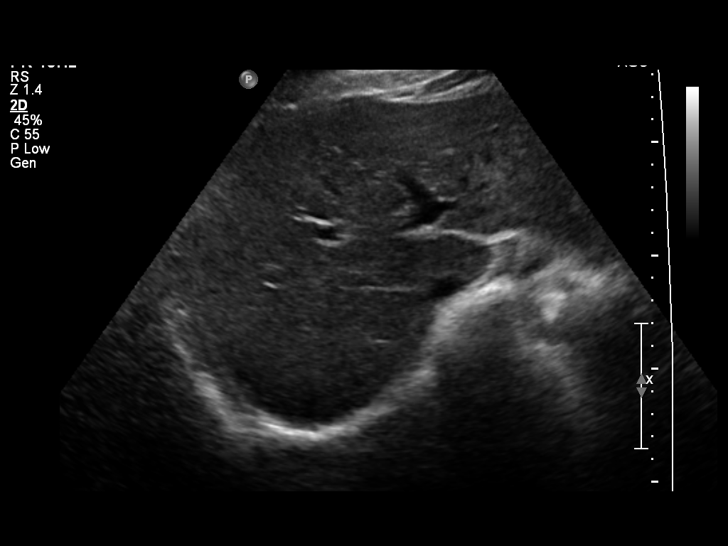
[im 42/91]
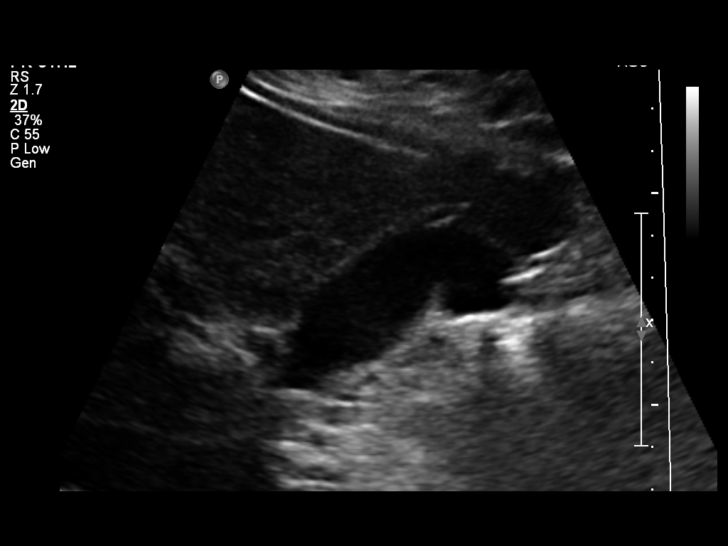
[im 49/91]
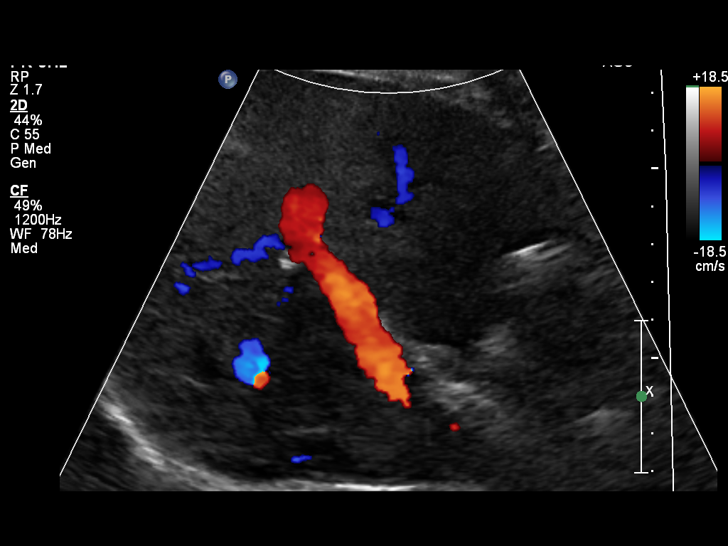
[im 57/91]
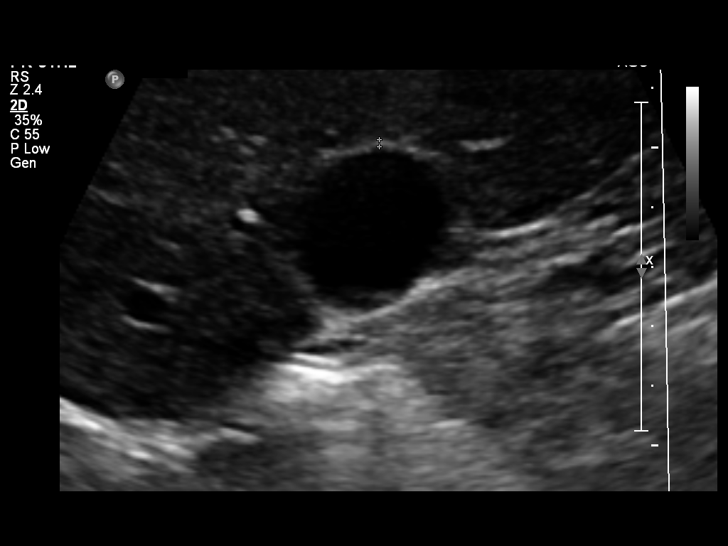
[im 61/91]
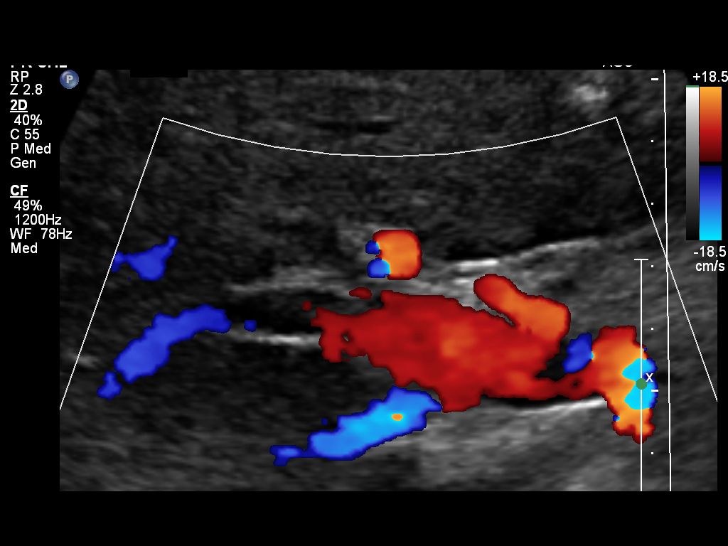
[im 68/91]
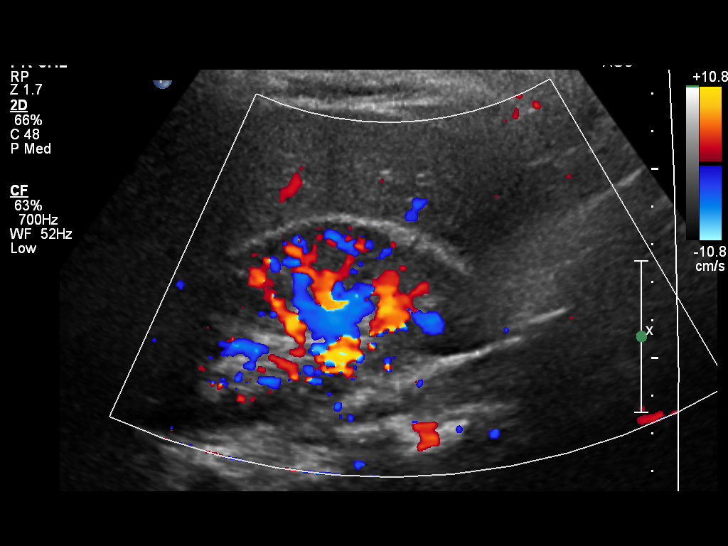
[im 76/91]
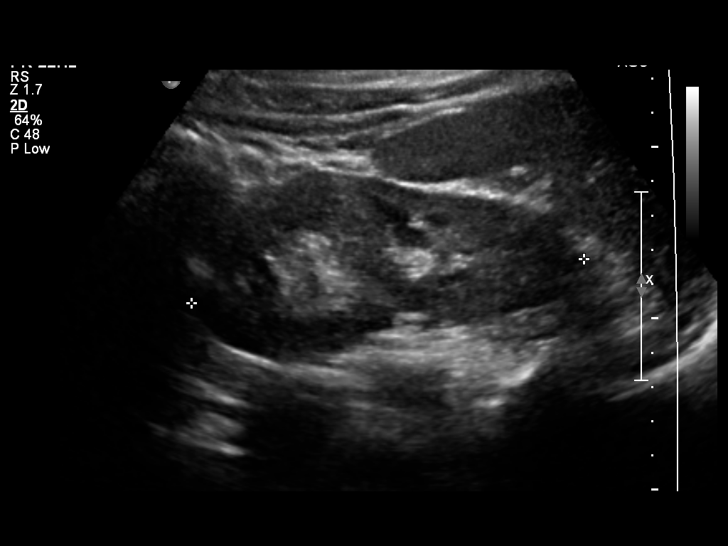
[im 83/91]
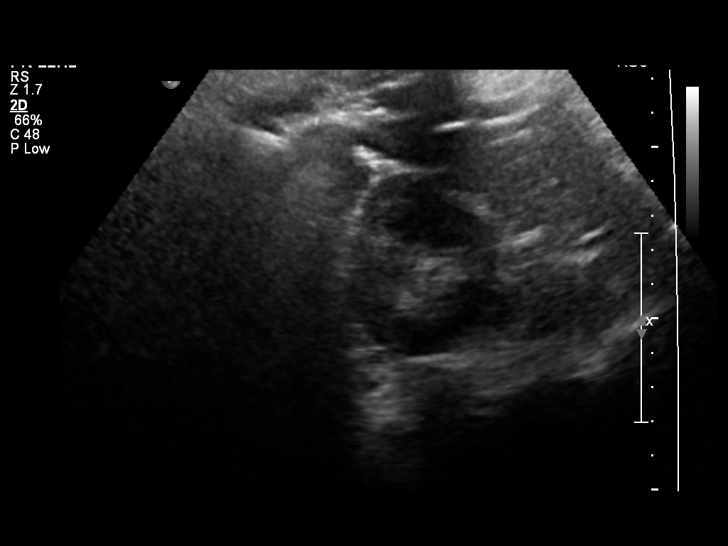
[im 91/91]
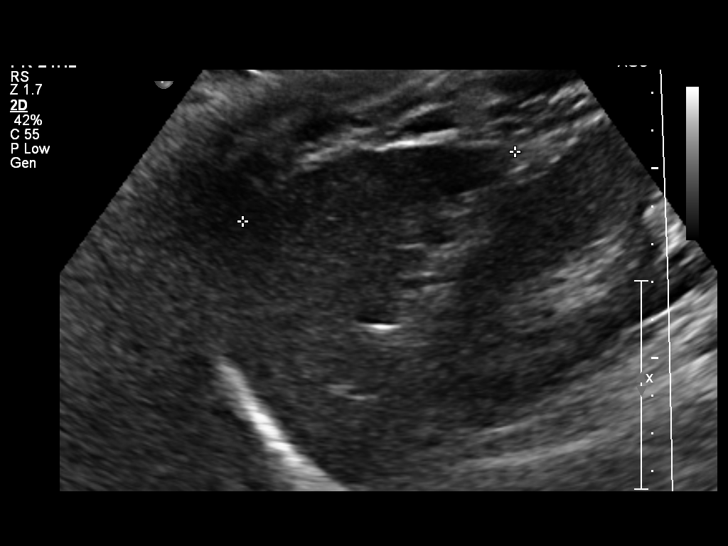

[14 of 25 positions shown; findings below may reference images not displayed]

FINDINGS: Gallbladder: No gallstones or wall thickening visualized. No
sonographic Murphy sign noted.

Common bile duct: Diameter: 1.6 mm

Liver: No focal lesion identified. Within normal limits in
parenchymal echogenicity.

IVC: No abnormality visualized.

Pancreas: Visualized portion normal. Incomplete visualization due to
overlying bowel gas P

Spleen: Size and appearance within normal limits.

Right Kidney: Length: 11.2 cm. Echogenicity within normal limits. No
mass or hydronephrosis visualized.

Left Kidney: Length: 11.5 cm. Echogenicity within normal limits. No
mass or hydronephrosis visualized.

Abdominal aorta: No aneurysm visualized.

Other findings: None.
IMPRESSION: Normal exam.

## 2016-03-12 ENCOUNTER — Encounter: Payer: Self-pay | Admitting: Internal Medicine

## 2016-04-06 NOTE — Progress Notes (Signed)
Cardiology Office Note Date:  04/07/2016  Patient ID:  Miranda, Clark Jul 12, 1970, MRN [id number] PCP:  [name], MD  Cardiologist:  Dr. [name] Electrophysiologist: Dr. [name]   Chief Complaint: ICD visit  History of Present Illness: [name] is a 45 y.o. female with history of morbid obesity, HTN, OSA and systolic HF due to nonischemic cardiomyopathy s/p Medtronic ICD (2006).  She was followed for systolic CHF initially in [name]. Apparently her EF recovered to 35-40%. However, she was admitted to [name] 09/19/13 for worsening dyspnea and CP. Coronaries were reportedly OK on LHC at that time at [name]. Echo showed EF of 15-20% with four-chamber enlargement with moderate-severe TR/MR. RHC as below. Rheumatological w/u negative.  CPX in 9/16 showed near-normal functional capacity.  Repeat echo in 6/17 shows that EF remains 20%.   She last saw cardiology/CHF in July with Dr. [name], stable.  She last saw Dr. [name] Oct 2016, doing well.  She comes in today to be seen for Dr. [name], she is feeling well.  She feels like she is at her baseline, unchanged for years. She reports able to do her ADLs as long as she paces herself.  She denies any rest SOB, no PND or orthopnea symptoms.  No near syncope or syncope, no shocks from her device.     Device history: MDT single chamber ICD, gen change and new lead 12/26/13, replacing 6949 lead, original implant 2006, Dr. [name], primary prevention The patient denies ever being shocked    Past Medical History:  Diagnosis Date  . Automatic implantable cardioverter-defibrillator in situ   . Chronic CHF (congestive heart failure) (HCC)    a. EF 15-20% b. RHC (09/2013) RA 14, RV 57/22, PA 64/36 (48), PCWP 18, FIck CO/CI 3.7/1.6, PVR 8.1 WU, PA sat 47%   . History of stomach ulcers   . Hypertension   . Hypothyroidism   . Morbid obesity ([name])   . Myocardial infarction 08/2013  . Nocturnal dyspnea   . Nonischemic cardiomyopathy ([name])   . Sinus  tachycardia   . Snoring-prob OSA 09/04/2011  . Sprint Fidelis ICD lead RECALL  [id number]     Past Surgical History:  Procedure Laterality Date  . BREATH TEK H PYLORI N/A 11/09/2014   Procedure: BREATH TEK H PYLORI;  Surgeon: Greer Pickerel, MD;  Location: [facility];  Service: General;  Laterality: N/A;  . CARDIAC CATHETERIZATION  ~ 2006; 09/2013  . CARDIAC DEFIBRILLATOR PLACEMENT  2006; 12/26/2013   Medtronic [id number]; 12-2013 ICD gen change and RV lead revision with new 6935 RV lead by Dr [name]  . CESAREAN SECTION  1999  . IMPLANTABLE CARDIOVERTER DEFIBRILLATOR GENERATOR CHANGE N/A 12/26/2013   Procedure: IMPLANTABLE CARDIOVERTER DEFIBRILLATOR GENERATOR CHANGE;  Surgeon: Miranda Sprang, MD;  Location: [facility] CATH LAB;  Service: Cardiovascular;  Laterality: N/A;  . LEAD REVISION N/A 12/26/2013   Procedure: LEAD REVISION;  Surgeon: Miranda Sprang, MD;  Location: [facility] CATH LAB;  Service: Cardiovascular;  Laterality: N/A;  . RIGHT HEART CATHETERIZATION N/A 02/15/2014   Procedure: RIGHT HEART CATH;  Surgeon: Miranda Dresser, MD;  Location: [facility] C[facility] LAB;  Service: Cardiovascular;  Laterality: N/A;  . TUBAL LIGATION  1999    Current Outpatient Prescriptions  Medication Sig Dispense Refill  . aspirin 81 MG tablet Take 81 mg by mouth daily.    Marland Kitchen atorvastatin (LIPITOR) 20 MG tablet Take 1 tablet (20 mg total) by mouth daily. 60 tablet 6  . carvedilol (COREG) 12.5 MG tablet  Take 1 tablet (12.5 mg total) by mouth 2 (two) times daily with a meal. 60 tablet 6  . digoxin (LANOXIN) 0.125 MG tablet TAKE 1 TABLET (125 MCG TOTAL) BY MOUTH DAILY. 30 tablet 6  . furosemide (LASIX) 40 MG tablet Take 2 tablets (80 mg total) by mouth daily. 60 tablet 6  . levothyroxine (SYNTHROID, LEVOTHROID) 88 MCG tablet Take 88 mcg by mouth daily before breakfast.    . potassium chloride SA (KLOR-CON M20) 20 MEQ tablet Take 1 tablet (20 mEq total) by mouth daily. 60 tablet 6  . sacubitril-valsartan (ENTRESTO) 24-26 MG Take 1 tablet by  mouth 2 (two) times daily. 60 tablet 6  . spironolactone (ALDACTONE) 25 MG tablet Take 1 tablet (25 mg total) by mouth daily. 30 tablet 6   No current facility-administered medications for this visit.     Allergies:   Review of patient's allergies indicates no known allergies.   Social History:  The patient  reports that she has never smoked. She has never used smokeless tobacco. She reports that she does not drink alcohol or use drugs.   Family History:  The patient's family history includes Heart disease in her maternal grandmother.  ROS:  Please see the history of present illness.    All other systems are reviewed and otherwise negative.   PHYSICAL EXAM:  VS:  BP 100/64   Pulse 88   Ht 5\' 4"  (1.626 m)   Wt 276 lb (125.2 kg)   BMI 47.38 kg/m  BMI: Body mass index is 47.38 kg/m. Well nourished, well developed, in no acute distress  HEENT: normocephalic, atraumatic  Neck: no JVD, carotid bruits or masses Cardiac: RRR, no extrasystoles appreciated,  no significant murmurs, no rubs, or gallops Lungs:  clear to auscultation bilaterally, no wheezing, rhonchi or rales  Abd: soft, nontender MS: no deformity or atrophy Ext: trace edema  Skin: warm and dry, no rash Neuro:  No gross deficits Psych: euthymic mood, full affect  ICD site is stable, no tethering or discomfort   EKG:  Done today and reviewed by myself is SR, PVC's (trigemenal pattern) ICD interrogation today: battery and lead status stable, 3 NSVT, optiVol is below threshold, PVC counter 29/hour   Echo (8/15) with EF 20%, moderate LV dilation, diffuse hypokinesis, normal RV size with mildly decreased systolic function, mild MR.  Echo (5/16) with EF 20%, spherical LV with diffuse hypokinesis, mild MR.  Echo (6/17) with EF 20%, diffuse hypokinesis, moderate LV dilation, normal RV, mild to moderate MR.   Sleep study did not show significant OSA.   RHC (8/15): Mean RA 3 PA 33/11 mean 19 Mean PCWP 8 CI  2.35  Holter (8/15) with < 1% PVCs  CPX 9/16 Peak VO2 16.2 VE/VCO2 slope 23.6 RER 1.25 Normal functional capacity  Recent Labs: 01/07/2016: BUN 8; Creatinine, Ser 0.93; Potassium 3.9; Sodium 135  No results found for requested labs within last 8760 hours.   CrCl cannot be calculated (Patient's most recent lab result is older than the maximum 21 days allowed.).   Wt Readings from Last 3 Encounters:  04/07/16 276 lb (125.2 kg)  01/07/16 286 lb 8 oz (130 kg)  10/26/15 280 lb 12.8 oz (127.4 kg)     Other studies reviewed: Additional studies/records reviewed today include: summarized above  ASSESSMENT AND PLAN:  1. DCM     Etiology is unclear, initially noted peri-partum (after son's birth), so cannot rule out peri-partum CMP.  She was noted at one point to  have frequent PVCs but holter showed <1% PVCs.      PVCs today, though device notes only 29/hour avg     fluid status is stable by exam and weight, she is 10 lbs less then her visit with dr. [name] in July, OptiVol seems to be on a trend up but well below threshold.      C/w Dr. [name]  2. ICD     stable function     q 98mo remotes  Disposition: F/u with CHF team as directed by them, c/w 3 month remote device checks and see dr. [name] in 1 year, sooner if needed.  Current medicines are reviewed at length with the patient today.  The patient did not have any concerns regarding medicines.  Haywood Lasso, PA-C 04/07/2016 11:51 AM     [facility] [address] [place] [address] [phone] (office)  [phone] (fax)

## 2016-04-07 ENCOUNTER — Encounter: Payer: Self-pay | Admitting: Physician Assistant

## 2016-04-07 ENCOUNTER — Ambulatory Visit (INDEPENDENT_AMBULATORY_CARE_PROVIDER_SITE_OTHER): Payer: BLUE CROSS/BLUE SHIELD | Admitting: Physician Assistant

## 2016-04-07 VITALS — BP 100/64 | HR 88 | Ht 64.0 in | Wt 276.0 lb

## 2016-04-07 DIAGNOSIS — I5022 Chronic systolic (congestive) heart failure: Secondary | ICD-10-CM

## 2016-04-07 NOTE — Patient Instructions (Signed)
Medication Instructions:   Your physician recommends that you continue on your current medications as directed. Please refer to the Current Medication list given to you today.    If you need a refill on your cardiac medications before your next appointment, please call your pharmacy.  Labwork: NONE ORDER TODAY    Testing/Procedures:  NONE ORDER TODAY   Follow-Up:  Your physician wants you to follow-up in: [place] will receive a reminder letter in the mail two months in advance. If you don't receive a letter, please call our office to schedule the follow-up appointment.  Remote monitoring is used to monitor your Pacemaker of ICD from home. This monitoring reduces the number of office visits required to check your device to one time per year. It allows [name] to keep an eye on the functioning of your device to ensure it is working properly. You are scheduled for a device check from home on . 07/08/16 You may send your transmission at any time that day. If you have a wireless device, the transmission will be sent automatically. After your physician reviews your transmission, you will receive a postcard with your next transmission date.    Any Other Special Instructions Will Be Listed Below (If Applicable).

## 2016-05-20 ENCOUNTER — Encounter (HOSPITAL_COMMUNITY): Payer: Self-pay | Admitting: Cardiology

## 2016-05-20 ENCOUNTER — Ambulatory Visit (HOSPITAL_COMMUNITY)
Admission: RE | Admit: 2016-05-20 | Discharge: 2016-05-20 | Disposition: A | Discharge status: Home / Self Care | Payer: BLUE CROSS/BLUE SHIELD | Source: Ambulatory Visit | Attending: Cardiology | Admitting: Cardiology

## 2016-05-20 VITALS — BP 108/74 | HR 117 | Wt 271.8 lb

## 2016-05-20 DIAGNOSIS — I110 Hypertensive heart disease with heart failure: Secondary | ICD-10-CM | POA: Insufficient documentation

## 2016-05-20 DIAGNOSIS — I428 Other cardiomyopathies: Secondary | ICD-10-CM | POA: Insufficient documentation

## 2016-05-20 DIAGNOSIS — Z8249 Family history of ischemic heart disease and other diseases of the circulatory system: Secondary | ICD-10-CM | POA: Insufficient documentation

## 2016-05-20 DIAGNOSIS — I272 Pulmonary hypertension, unspecified: Secondary | ICD-10-CM | POA: Insufficient documentation

## 2016-05-20 DIAGNOSIS — Z95810 Presence of automatic (implantable) cardiac defibrillator: Secondary | ICD-10-CM | POA: Insufficient documentation

## 2016-05-20 DIAGNOSIS — Z79899 Other long term (current) drug therapy: Secondary | ICD-10-CM | POA: Diagnosis not present

## 2016-05-20 DIAGNOSIS — G4733 Obstructive sleep apnea (adult) (pediatric): Secondary | ICD-10-CM | POA: Insufficient documentation

## 2016-05-20 DIAGNOSIS — Z7982 Long term (current) use of aspirin: Secondary | ICD-10-CM | POA: Diagnosis not present

## 2016-05-20 DIAGNOSIS — I5022 Chronic systolic (congestive) heart failure: Secondary | ICD-10-CM | POA: Insufficient documentation

## 2016-05-20 DIAGNOSIS — E6609 Other obesity due to excess calories: Secondary | ICD-10-CM | POA: Diagnosis not present

## 2016-05-20 LAB — BASIC METABOLIC PANEL
Anion gap: 7 (ref 5–15)
BUN: 12 mg/dL (ref 6–20)
CO2: 25 mmol/L (ref 22–32)
Calcium: 9.2 mg/dL (ref 8.9–10.3)
Chloride: 106 mmol/L (ref 101–111)
Creatinine, Ser: 1.29 mg/dL — ABNORMAL HIGH (ref 0.44–1.00)
GFR calc Af Amer: 57 mL/min — ABNORMAL LOW (ref >60)
GFR calc non Af Amer: 49 mL/min — ABNORMAL LOW (ref >60)
Glucose, Bld: 94 mg/dL (ref 65–99)
Potassium: 3.6 mmol/L (ref 3.5–5.1)
Sodium: 138 mmol/L (ref 135–145)

## 2016-05-20 LAB — PROTIME-INR
INR: 1.22
Prothrombin Time: 15.5 seconds — ABNORMAL HIGH (ref 11.4–15.2)

## 2016-05-20 LAB — BRAIN NATRIURETIC PEPTIDE: B Natriuretic Peptide: 1051.9 pg/mL — ABNORMAL HIGH (ref 0.0–100.0)

## 2016-05-20 LAB — CBC
HCT: 39.9 % (ref 36.0–46.0)
Hemoglobin: 13.1 g/dL (ref 12.0–15.0)
MCH: 28.2 pg (ref 26.0–34.0)
MCHC: 32.8 g/dL (ref 30.0–36.0)
MCV: 86 fL (ref 78.0–100.0)
Platelets: 269 10*3/uL (ref 150–400)
RBC: 4.64 MIL/uL (ref 3.87–5.11)
RDW: 13.8 % (ref 11.5–15.5)
WBC: 10.6 10*3/uL — ABNORMAL HIGH (ref 4.0–10.5)

## 2016-05-20 MED ORDER — FUROSEMIDE 40 MG PO TABS: 40.0000 mg | ORAL_TABLET | ORAL | 6 refills | Status: DC

## 2016-05-20 NOTE — Patient Instructions (Addendum)
INCREASE Lasix to 80 mg twice a day for 2 days, then 80 mg in the AM and 40 mg in the PM thereafter  Your physician has requested that you have a cardiac catheterization. Cardiac catheterization is used to diagnose and/or treat various heart conditions. Doctors may recommend this procedure for a number of different reasons. The most common reason is to evaluate chest pain. Chest pain can be a symptom of coronary artery disease (CAD), and cardiac catheterization can show whether plaque is narrowing or blocking your heart's arteries. This procedure is also used to evaluate the valves, as well as measure the blood flow and oxygen levels in different parts of your heart. For further information please visit [email or web address]. Please follow instruction sheet, as given.  Labs today We will only contact you if something comes back abnormal or we need to make some changes. Otherwise no news is good news!  Your physician recommends that you schedule a follow-up appointment in: 2 weeks with Rebecca Eaton

## 2016-05-20 NOTE — Progress Notes (Signed)
Advanced Heart Failure Medication Review by a Pharmacist  Does the patient  feel that his/her medications are working for him/her?  yes  Has the patient been experiencing any side effects to the medications prescribed?  no  Does the patient measure his/her own blood pressure or blood glucose at home?  yes   Does the patient have any problems obtaining medications due to transportation or finances?   no  Understanding of regimen: good Understanding of indications: good Potential of compliance: good Patient understands to avoid NSAIDs. Patient understands to avoid decongestants.  Issues to address at subsequent visits: None   Pharmacist comments:  Ms. Marcella is a pleasant 45 yo F presenting with a current medication list. She reports good compliance with her regimen and did not have any specific medication-related questions or concerns for me at this time.   Ruta Hinds. Velva Harman, PharmD, BCPS, CPP Clinical Pharmacist Pager: [phone] Phone: [phone] 05/20/2016 2:47 PM      Time with patient: 10 minutes Preparation and documentation time: 2 minutes Total time: 12 minutes

## 2016-05-20 NOTE — Progress Notes (Signed)
Patient ID: [name], female   DOB: 07-28-1970, 45 y.o.   MRN: [id number]  PCP: [facility] in [place] Cardiology: Dr. [name]  HPI: [name] is a 45 y.o. female with morbid obesity, HTN, OSA and systolic HF due to nonischemic cardiomyopathy s/p Medtronic ICD (2006).  She was followed for systolic CHF initially in [place]. Apparently her EF recovered to 35-40%. However, she was admitted to [facility] 09/19/13 for worsening dyspnea and CP. Coronaries were reportedly OK on LHC at that time at [facility]. Echo showed EF of 15-20% with four-chamber enlargement with moderate-severe TR/MR. RHC as below. Rheumatological w/u negative.  CPX in 9/16 showed near-normal functional capacity.  Repeat echo in 6/17 shows that EF remains 20%.   She presents today for regular follow up.  Has been more short of breath over the past couple days. Weight down 15 lbs from last visit. Her weight is down 20 lbs by her home scale in July.  Has had decreased appetite over past couple of days.  Not very active. Gets around the house and runs errands, but nothing else.  Sleeps with HOB elevated.  No SOB with changing clothes or showering. Usually not SOB with steps but SOB up 8 steps at home yesterday.  No dizziness or lightheadedness.   Optivol: fluid index well above threshold and trending up over past 2-3 weeks. Pt activity < 1 hr a day. Thoracic impedence depressed.  RHC 09/24/13  RA 14  RV 57/22  PA 64/36 (48)  PCWP 18  Fick CO/CI: 3.7/1.6  PVR 8.1 WU  Ao sat 95%  PA sat 47%   CPX  11/09/13  FEV1 2.6L (85%)  FVC 2.1L (86%)  FEV1/FVC 86%  pVO2 14.8 (82%)  Slope 31.7%  RER 1.11  Ve/MVV 86%   Echo (8/15) with EF 20%, moderate LV dilation, diffuse hypokinesis, normal RV size with mildly decreased systolic function, mild MR.  Echo (5/16) with EF 20%, spherical LV with diffuse hypokinesis, mild MR.  Echo (6/17) with EF 20%, diffuse hypokinesis, moderate LV dilation, normal RV, mild to moderate MR.    Sleep study did not show significant OSA.   RHC (8/15): Mean RA 3 PA 33/11 mean 19 Mean PCWP 8 CI 2.35  Holter (8/15) with < 1% PVCs  CPX 9/16 Peak VO2 16.2 VE/VCO2 slope 23.6 RER 1.25 Normal functional capacity  Labs: (11/09/13): Dig level 0.5, K 3.6, Creatinine 0.83, pro-BNP 622 (12/19/13): K 3.6, creatinine 0.8 (7/15): K 4.1, creatinine 0.91 (8/15): K 3.9, creatinine 0.91 (10/15): K 4, creatinine 0.93, proBNP 129 (12/15): K 4, creatinine 0.9, digoxin 0.4 (5/16): K 4, creatinine 0.86 (9/16): K 3.6, creatinine 0.86, digoxin < 0.2 (5/17): K 4.1, creatinine 0.95, digoxin 0.3  SH: Disabled since 2014. Lives her husband and 2 children. Does not drink alcohol or smoke   FH: Grandmother had heart failure   ROS: All systems negative except as listed in HPI, PMH and Problem List.  Past Medical History:  Diagnosis Date  . Automatic implantable cardioverter-defibrillator in situ   . Chronic CHF (congestive heart failure) (HCC)    a. EF 15-20% b. RHC (09/2013) RA 14, RV 57/22, PA 64/36 (48), PCWP 18, FIck CO/CI 3.7/1.6, PVR 8.1 WU, PA sat 47%   . History of stomach ulcers   . Hypertension   . Hypothyroidism   . Morbid obesity ([place])   . Myocardial infarction 08/2013  . Nocturnal dyspnea   . Nonischemic cardiomyopathy ([place])   . Sinus tachycardia   .  Snoring-prob OSA 09/04/2011  . Sprint Fidelis ICD lead RECALL  [id number]     Current Outpatient Prescriptions  Medication Sig Dispense Refill  . aspirin 81 MG tablet Take 81 mg by mouth daily.    Marland Kitchen atorvastatin (LIPITOR) 20 MG tablet Take 1 tablet (20 mg total) by mouth daily. 60 tablet 6  . carvedilol (COREG) 12.5 MG tablet Take 1 tablet (12.5 mg total) by mouth 2 (two) times daily with a meal. 60 tablet 6  . digoxin (LANOXIN) 0.125 MG tablet TAKE 1 TABLET (125 MCG TOTAL) BY MOUTH DAILY. 30 tablet 6  . furosemide (LASIX) 40 MG tablet Take 2 tablets (80 mg total) by mouth daily. 60 tablet 6  . levothyroxine (SYNTHROID, LEVOTHROID)  88 MCG tablet Take 88 mcg by mouth daily before breakfast.    . potassium chloride SA (KLOR-CON M20) 20 MEQ tablet Take 1 tablet (20 mEq total) by mouth daily. 60 tablet 6  . sacubitril-valsartan (ENTRESTO) 24-26 MG Take 1 tablet by mouth 2 (two) times daily. 60 tablet 6  . spironolactone (ALDACTONE) 25 MG tablet Take 1 tablet (25 mg total) by mouth daily. 30 tablet 6   No current facility-administered medications for this encounter.     Vitals:   05/20/16 1441  BP: 108/74  BP Location: Left Arm  Patient Position: Sitting  Cuff Size: Large  Pulse: (!) 117  SpO2: 98%  Weight: 271 lb 12.8 oz (123.3 kg)   Wt Readings from Last 3 Encounters:  05/20/16 271 lb 12.8 oz (123.3 kg)  04/07/16 276 lb (125.2 kg)  01/07/16 286 lb 8 oz (130 kg)     PHYSICAL EXAM: General: Fatigued appearing. NAD.   HEENT: Normal Neck: supple. JVP appears 8-9 with mild HJR. . Carotids 2+ bilat; no bruits. No lymphadenopathy. Thyroid enlarged.   Cor: PMI nondisplaced. Regular. Slightly tachy.  No M/G/R noted. No peripheral edema appreciated. .  Lungs: Mildly diminished basilar sounds.  Abdomen: Obese, soft, NT, ND, no HSM. No bruits or masses. +BS Extremities: no cyanosis, clubbing, rash. Neuro: alert & oriented x 3, cranial nerves grossly intact. moves all 4 extremities w/o difficulty. Affect pleasant.  EKG Sinus Tach with PAC 106 bpm  ASSESSMENT & PLAN:  1. Chronic systolic HF: Nonischemic cardiomyopathy.  5/16 echo showed EF 20% with diffuse hypokinesis.  She has a Medtronic ICD.  Cause for cardiomyopathy is uncertain: initially noted peri-partum (after son's birth), so cannot rule out peri-partum CMP.  She was noted at one point to have frequent PVCs but holter showed <1% PVCs.   RHC in 8/15 showed normal filling pressures and preserved cardiac output.  She does not qualify for CRT with narrow QRS.  CPX in 9/16 with near-normal functional capacity.  Repeat echo in 6/17 showed EF still around 20%.  - NYHA  class III-IIIb. Volume overloaded by Optivol and difficult by exam.  - Take Lasix 80 mg BID x 2 days then take 80 mg q am and 40 mg q pm chronically.   - With worsened functional status and difficult volume, will plan for RHC this week.  - Continue current Coreg, spironolactone, digoxin, and Entresto.   - Think that she may be a good candidate for BEAT-HF (barostim trial), will connect her with our research staff.  2. Pulmonary HTN: Mixed picture with PVR 8.1 on RHC at [facility].  However, repeat study in 8/15 here showed no significant pulmonary hypertension.  3. Morbid obesity: Has decided against gastric sleeve. Continue to work on weight loss.  Needs to get back to exercise. 4. OSA:  Surprisingly, no significant OSA on sleep study.   Last [place] 8/15. With worsening functional status, picture slightly concerning for low output. Discussed with Dr. [name] will increase lasix for time being and schedule RHC for end of week.  Pre=cath labs today. Follow up 2 weeks.   Shirley Friar, PA-C 05/20/2016   Total time spent > 25 minutes. Over half that spent discussing the above.

## 2016-05-21 ENCOUNTER — Other Ambulatory Visit (HOSPITAL_COMMUNITY): Payer: Self-pay | Admitting: *Deleted

## 2016-05-21 DIAGNOSIS — I5022 Chronic systolic (congestive) heart failure: Secondary | ICD-10-CM

## 2016-05-23 ENCOUNTER — Ambulatory Visit (HOSPITAL_COMMUNITY)
Admission: RE | Admit: 2016-05-23 | Discharge: 2016-05-23 | Disposition: A | Discharge status: Home / Self Care | Payer: BLUE CROSS/BLUE SHIELD | Source: Ambulatory Visit | Attending: Cardiology | Admitting: Cardiology

## 2016-05-23 ENCOUNTER — Encounter (HOSPITAL_COMMUNITY)
Admission: RE | Disposition: A | Discharge status: Home / Self Care | Payer: Self-pay | Source: Ambulatory Visit | Attending: Cardiology

## 2016-05-23 DIAGNOSIS — Z8249 Family history of ischemic heart disease and other diseases of the circulatory system: Secondary | ICD-10-CM | POA: Diagnosis not present

## 2016-05-23 DIAGNOSIS — I252 Old myocardial infarction: Secondary | ICD-10-CM | POA: Diagnosis not present

## 2016-05-23 DIAGNOSIS — I5022 Chronic systolic (congestive) heart failure: Secondary | ICD-10-CM | POA: Insufficient documentation

## 2016-05-23 DIAGNOSIS — G4733 Obstructive sleep apnea (adult) (pediatric): Secondary | ICD-10-CM | POA: Insufficient documentation

## 2016-05-23 DIAGNOSIS — R0602 Shortness of breath: Secondary | ICD-10-CM | POA: Diagnosis present

## 2016-05-23 DIAGNOSIS — Z95810 Presence of automatic (implantable) cardiac defibrillator: Secondary | ICD-10-CM | POA: Diagnosis not present

## 2016-05-23 DIAGNOSIS — I272 Pulmonary hypertension, unspecified: Secondary | ICD-10-CM | POA: Insufficient documentation

## 2016-05-23 DIAGNOSIS — E039 Hypothyroidism, unspecified: Secondary | ICD-10-CM | POA: Insufficient documentation

## 2016-05-23 DIAGNOSIS — Z79899 Other long term (current) drug therapy: Secondary | ICD-10-CM | POA: Diagnosis not present

## 2016-05-23 DIAGNOSIS — I509 Heart failure, unspecified: Secondary | ICD-10-CM | POA: Diagnosis not present

## 2016-05-23 DIAGNOSIS — I110 Hypertensive heart disease with heart failure: Secondary | ICD-10-CM | POA: Insufficient documentation

## 2016-05-23 DIAGNOSIS — Z7982 Long term (current) use of aspirin: Secondary | ICD-10-CM | POA: Diagnosis not present

## 2016-05-23 DIAGNOSIS — Z6841 Body Mass Index (BMI) 40.0 and over, adult: Secondary | ICD-10-CM | POA: Insufficient documentation

## 2016-05-23 HISTORY — PX: CARDIAC CATHETERIZATION: SHX172

## 2016-05-23 LAB — POCT I-STAT 3, VENOUS BLOOD GAS (G3P V)
Bicarbonate: 24 mmol/L (ref 20.0–28.0)
Bicarbonate: 24.3 mmol/L (ref 20.0–28.0)
O2 Saturation: 60 %
O2 Saturation: 62 %
TCO2: 25 mmol/L (ref 0–100)
TCO2: 25 mmol/L (ref 0–100)
pCO2, Ven: 37.4 mmHg — ABNORMAL LOW (ref 44.0–60.0)
pCO2, Ven: 38.8 mmHg — ABNORMAL LOW (ref 44.0–60.0)
pH, Ven: 7.405 (ref 7.250–7.430)
pH, Ven: 7.415 (ref 7.250–7.430)
pO2, Ven: 31 mmHg — CL (ref 32.0–45.0)
pO2, Ven: 32 mmHg (ref 32.0–45.0)

## 2016-05-23 SURGERY — RIGHT HEART CATH
Anesthesia: LOCAL

## 2016-05-23 MED ORDER — MIDAZOLAM HCL 2 MG/2ML IJ SOLN
INTRAMUSCULAR | Status: AC
  Filled 2016-05-23: qty 2

## 2016-05-23 MED ORDER — ACETAMINOPHEN 325 MG PO TABS: 650.0000 mg | ORAL_TABLET | ORAL | Status: DC | PRN

## 2016-05-23 MED ORDER — SODIUM CHLORIDE 0.9 % IV SOLN: 250.0000 mL | INTRAVENOUS | Status: DC | PRN

## 2016-05-23 MED ORDER — SODIUM CHLORIDE 0.9% FLUSH: 3.0000 mL | INTRAVENOUS | Status: DC | PRN

## 2016-05-23 MED ORDER — SODIUM CHLORIDE 0.9% FLUSH: 3.0000 mL | Freq: Two times a day (BID) | INTRAVENOUS | Status: DC

## 2016-05-23 MED ORDER — FENTANYL CITRATE (PF) 100 MCG/2ML IJ SOLN
INTRAMUSCULAR | Status: AC
  Filled 2016-05-23: qty 2

## 2016-05-23 MED ORDER — HEPARIN (PORCINE) IN NACL 2-0.9 UNIT/ML-% IJ SOLN
INTRAMUSCULAR | Status: DC | PRN
  Administered 2016-05-23: 500 mL

## 2016-05-23 MED ORDER — FENTANYL CITRATE (PF) 100 MCG/2ML IJ SOLN
INTRAMUSCULAR | Status: DC | PRN
  Administered 2016-05-23: 25 ug via INTRAVENOUS

## 2016-05-23 MED ORDER — LIDOCAINE HCL (PF) 1 % IJ SOLN
INTRAMUSCULAR | Status: AC
  Filled 2016-05-23: qty 30

## 2016-05-23 MED ORDER — ASPIRIN 81 MG PO CHEW: 81.0000 mg | CHEWABLE_TABLET | ORAL | Status: DC

## 2016-05-23 MED ORDER — HEPARIN (PORCINE) IN NACL 2-0.9 UNIT/ML-% IJ SOLN
INTRAMUSCULAR | Status: AC
  Filled 2016-05-23: qty 500

## 2016-05-23 MED ORDER — SODIUM CHLORIDE 0.9 % IV SOLN
INTRAVENOUS | Status: DC
  Administered 2016-05-23: 12:00:00 via INTRAVENOUS

## 2016-05-23 MED ORDER — ONDANSETRON HCL 4 MG/2ML IJ SOLN: 4.0000 mg | Freq: Four times a day (QID) | INTRAMUSCULAR | Status: DC | PRN

## 2016-05-23 MED ORDER — LIDOCAINE HCL (PF) 1 % IJ SOLN
INTRAMUSCULAR | Status: DC | PRN
Start: 2016-05-23 — End: 2016-05-23
  Administered 2016-05-23: 2 mL

## 2016-05-23 MED ORDER — MIDAZOLAM HCL 2 MG/2ML IJ SOLN
INTRAMUSCULAR | Status: DC | PRN
  Administered 2016-05-23: 0.5 mg via INTRAVENOUS

## 2016-05-23 SURGICAL SUPPLY — 8 items
CATH BALLN WEDGE 5F 110CM (CATHETERS) ×1 IMPLANT
KIT HEART LEFT (KITS) ×2 IMPLANT
KIT HEART RIGHT NAMIC (KITS) ×2 IMPLANT
PACK CARDIAC CATHETERIZATION (CUSTOM PROCEDURE TRAY) ×2 IMPLANT
SHEATH FAST CATH BRACH 5F 5CM (SHEATH) ×1 IMPLANT
SYR MEDRAD MARK V 150ML (SYRINGE) ×2 IMPLANT
TRANSDUCER W/STOPCOCK (MISCELLANEOUS) ×4 IMPLANT
TUBING CIL FLEX 10 FLL-RA (TUBING) ×2 IMPLANT

## 2016-05-23 NOTE — Interval H&P Note (Signed)
History and Physical Interval Note:  05/23/2016 1:09 PM  [name]  has presented today for surgery, with the diagnosis of chf  The various methods of treatment have been discussed with the patient and family. After consideration of risks, benefits and other options for treatment, the patient has consented to  Procedure(s): Right Heart Cath (N/A) as a surgical intervention .  The patient's history has been reviewed, patient examined, no change in status, stable for surgery.  I have reviewed the patient's chart and labs.  Questions were answered to the patient's satisfaction.     Neytiri Asche [employer]

## 2016-05-23 NOTE — Discharge Instructions (Signed)
Angiogram, Care After These instructions give you information about caring for yourself after your procedure. Your doctor may also give you more specific instructions. Call your doctor if you have any problems or questions after your procedure. Follow these instructions at home:  Take medicines only as told by your doctor.  Follow your doctor's instructions about:  Care of the area where the tube was inserted.  Bandage (dressing) changes and removal.  You may shower 24-48 hours after the procedure or as told by your doctor.  Do not take baths, swim, or use a hot tub until your doctor approves.  Every day, check the area where the tube was inserted. Watch for:  Redness, swelling, or pain.  Fluid, blood, or pus.  Do not apply powder or lotion to the site.  Do not lift anything that is heavier than 10 lb (4.5 kg) for 2 days or as told by your doctor.  Ask your doctor when you can:  Return to work or school.  Do physical activities or play sports.  Have sex.  Do not drive or operate heavy machinery for 24 hours or as told by your doctor.  Have someone with you for the first 24 hours after the procedure.  Keep all follow-up visits as told by your doctor. This is important. Contact a health care provider if:  You have a fever.  You have chills.  You have more bleeding from the area where the tube was inserted. Hold pressure on the area.  You have redness, swelling, or pain in the area where the tube was inserted.  You have fluid or pus coming from the area. Get help right away if:  You have a lot of pain in the area where the tube was inserted.  The area where the tube was inserted is bleeding, and the bleeding does not stop after 30 minutes of holding steady pressure on the area.  The area near or just beyond the insertion site becomes pale, cool, tingly, or numb. This information is not intended to replace advice given to you by your health care provider. Make  sure you discuss any questions you have with your health care provider. Document Released: 09/05/2008 Document Revised: 11/15/2015 Document Reviewed: 11/10/2012 Elsevier Interactive Patient Education  2017 [employer].

## 2016-05-23 NOTE — H&P (View-Only) (Signed)
Patient ID: [name], female   DOB: 1970-11-23, 45 y.o.   MRN: [id number]  PCP: [facility] in [place] Cardiology: Dr. [name]  HPI: [name] is a 45 y.o. female with morbid obesity, HTN, OSA and systolic HF due to nonischemic cardiomyopathy s/p Medtronic ICD (2006).  She was followed for systolic CHF initially in [place]. Apparently her EF recovered to 35-40%. However, she was admitted to [facility] 09/19/13 for worsening dyspnea and CP. Coronaries were reportedly OK on LHC at that time at [facility]. Echo showed EF of 15-20% with four-chamber enlargement with moderate-severe TR/MR. RHC as below. Rheumatological w/u negative.  CPX in 9/16 showed near-normal functional capacity.  Repeat echo in 6/17 shows that EF remains 20%.   She presents today for regular follow up.  Has been more short of breath over the past couple days. Weight down 15 lbs from last visit. Her weight is down 20 lbs by her home scale in July.  Has had decreased appetite over past couple of days.  Not very active. Gets around the house and runs errands, but nothing else.  Sleeps with HOB elevated.  No SOB with changing clothes or showering. Usually not SOB with steps but SOB up 8 steps at home yesterday.  No dizziness or lightheadedness.   Optivol: fluid index well above threshold and trending up over past 2-3 weeks. Pt activity < 1 hr a day. Thoracic impedence depressed.  RHC 09/24/13  RA 14  RV 57/22  PA 64/36 (48)  PCWP 18  Fick CO/CI: 3.7/1.6  PVR 8.1 WU  Ao sat 95%  PA sat 47%   CPX  11/09/13  FEV1 2.6L (85%)  FVC 2.1L (86%)  FEV1/FVC 86%  pVO2 14.8 (82%)  Slope 31.7%  RER 1.11  Ve/MVV 86%   Echo (8/15) with EF 20%, moderate LV dilation, diffuse hypokinesis, normal RV size with mildly decreased systolic function, mild MR.  Echo (5/16) with EF 20%, spherical LV with diffuse hypokinesis, mild MR.  Echo (6/17) with EF 20%, diffuse hypokinesis, moderate LV dilation, normal RV, mild to moderate MR.    Sleep study did not show significant OSA.   RHC (8/15): Mean RA 3 PA 33/11 mean 19 Mean PCWP 8 CI 2.35  Holter (8/15) with < 1% PVCs  CPX 9/16 Peak VO2 16.2 VE/VCO2 slope 23.6 RER 1.25 Normal functional capacity  Labs: (11/09/13): Dig level 0.5, K 3.6, Creatinine 0.83, pro-BNP 622 (12/19/13): K 3.6, creatinine 0.8 (7/15): K 4.1, creatinine 0.91 (8/15): K 3.9, creatinine 0.91 (10/15): K 4, creatinine 0.93, proBNP 129 (12/15): K 4, creatinine 0.9, digoxin 0.4 (5/16): K 4, creatinine 0.86 (9/16): K 3.6, creatinine 0.86, digoxin < 0.2 (5/17): K 4.1, creatinine 0.95, digoxin 0.3  SH: Disabled since 2014. Lives her husband and 2 children. Does not drink alcohol or smoke   FH: Grandmother had heart failure   ROS: All systems negative except as listed in HPI, PMH and Problem List.  Past Medical History:  Diagnosis Date  . Automatic implantable cardioverter-defibrillator in situ   . Chronic CHF (congestive heart failure) (HCC)    a. EF 15-20% b. RHC (09/2013) RA 14, RV 57/22, PA 64/36 (48), PCWP 18, FIck CO/CI 3.7/1.6, PVR 8.1 WU, PA sat 47%   . History of stomach ulcers   . Hypertension   . Hypothyroidism   . Morbid obesity ([place])   . Myocardial infarction 08/2013  . Nocturnal dyspnea   . Nonischemic cardiomyopathy ([place])   . Sinus tachycardia   .  Snoring-prob OSA 09/04/2011  . Sprint Fidelis ICD lead RECALL  [id number]     Current Outpatient Prescriptions  Medication Sig Dispense Refill  . aspirin 81 MG tablet Take 81 mg by mouth daily.    Marland Kitchen atorvastatin (LIPITOR) 20 MG tablet Take 1 tablet (20 mg total) by mouth daily. 60 tablet 6  . carvedilol (COREG) 12.5 MG tablet Take 1 tablet (12.5 mg total) by mouth 2 (two) times daily with a meal. 60 tablet 6  . digoxin (LANOXIN) 0.125 MG tablet TAKE 1 TABLET (125 MCG TOTAL) BY MOUTH DAILY. 30 tablet 6  . furosemide (LASIX) 40 MG tablet Take 2 tablets (80 mg total) by mouth daily. 60 tablet 6  . levothyroxine (SYNTHROID, LEVOTHROID)  88 MCG tablet Take 88 mcg by mouth daily before breakfast.    . potassium chloride SA (KLOR-CON M20) 20 MEQ tablet Take 1 tablet (20 mEq total) by mouth daily. 60 tablet 6  . sacubitril-valsartan (ENTRESTO) 24-26 MG Take 1 tablet by mouth 2 (two) times daily. 60 tablet 6  . spironolactone (ALDACTONE) 25 MG tablet Take 1 tablet (25 mg total) by mouth daily. 30 tablet 6   No current facility-administered medications for this encounter.     Vitals:   05/20/16 1441  BP: 108/74  BP Location: Left Arm  Patient Position: Sitting  Cuff Size: Large  Pulse: (!) 117  SpO2: 98%  Weight: 271 lb 12.8 oz (123.3 kg)   Wt Readings from Last 3 Encounters:  05/20/16 271 lb 12.8 oz (123.3 kg)  04/07/16 276 lb (125.2 kg)  01/07/16 286 lb 8 oz (130 kg)     PHYSICAL EXAM: General: Fatigued appearing. NAD.   HEENT: Normal Neck: supple. JVP appears 8-9 with mild HJR. . Carotids 2+ bilat; no bruits. No lymphadenopathy. Thyroid enlarged.   Cor: PMI nondisplaced. Regular. Slightly tachy.  No M/G/R noted. No peripheral edema appreciated. .  Lungs: Mildly diminished basilar sounds.  Abdomen: Obese, soft, NT, ND, no HSM. No bruits or masses. +BS Extremities: no cyanosis, clubbing, rash. Neuro: alert & oriented x 3, cranial nerves grossly intact. moves all 4 extremities w/o difficulty. Affect pleasant.  EKG Sinus Tach with PAC 106 bpm  ASSESSMENT & PLAN:  1. Chronic systolic HF: Nonischemic cardiomyopathy.  5/16 echo showed EF 20% with diffuse hypokinesis.  She has a Medtronic ICD.  Cause for cardiomyopathy is uncertain: initially noted peri-partum (after son's birth), so cannot rule out peri-partum CMP.  She was noted at one point to have frequent PVCs but holter showed <1% PVCs.   RHC in 8/15 showed normal filling pressures and preserved cardiac output.  She does not qualify for CRT with narrow QRS.  CPX in 9/16 with near-normal functional capacity.  Repeat echo in 6/17 showed EF still around 20%.  - NYHA  class III-IIIb. Volume overloaded by Optivol and difficult by exam.  - Take Lasix 80 mg BID x 2 days then take 80 mg q am and 40 mg q pm chronically.   - With worsened functional status and difficult volume, will plan for RHC this week.  - Continue current Coreg, spironolactone, digoxin, and Entresto.   - Think that she may be a good candidate for BEAT-HF (barostim trial), will connect her with our research staff.  2. Pulmonary HTN: Mixed picture with PVR 8.1 on RHC at [facility].  However, repeat study in 8/15 here showed no significant pulmonary hypertension.  3. Morbid obesity: Has decided against gastric sleeve. Continue to work on weight loss.  Needs to get back to exercise. 4. OSA:  Surprisingly, no significant OSA on sleep study.   Last [place] 8/15. With worsening functional status, picture slightly concerning for low output. Discussed with Dr. [name] will increase lasix for time being and schedule RHC for end of week.  Pre=cath labs today. Follow up 2 weeks.   Shirley Friar, PA-C 05/20/2016   Total time spent > 25 minutes. Over half that spent discussing the above.

## 2016-05-26 ENCOUNTER — Encounter (HOSPITAL_COMMUNITY): Payer: Self-pay | Admitting: Cardiology

## 2016-05-26 ENCOUNTER — Telehealth (HOSPITAL_COMMUNITY): Payer: Self-pay | Admitting: *Deleted

## 2016-05-26 MED ORDER — POTASSIUM CHLORIDE CRYS ER 20 MEQ PO TBCR: 20.0000 meq | EXTENDED_RELEASE_TABLET | Freq: Two times a day (BID) | ORAL | 6 refills | Status: DC

## 2016-05-26 NOTE — Telephone Encounter (Signed)
Notes Recorded by Scarlette Calico, RN on 05/26/2016 at 12:03 PM EST Pt aware and agreeable ------  Notes Recorded by Shirley Friar, PA-C on 05/26/2016 at 10:33 AM EST Please call today. Dr. [name] increased lasix further, needs more potassium. 20 meq BID for now.    Legrand Como [address] [place], PA-C 05/26/2016 10:33 AM ------  Notes Recorded by Shirley Friar, PA-C on 05/20/2016 at 8:30 PM EST BNP up, Creatinine mildly elevated.  K 3.6  Increased lasix in clinic today. Please increase K to 20 meq BID.     Legrand Como [address]" [place], PA-C 05/20/2016 8:30 PM

## 2016-05-30 ENCOUNTER — Other Ambulatory Visit: Payer: Self-pay | Admitting: Internal Medicine

## 2016-06-03 ENCOUNTER — Ambulatory Visit (HOSPITAL_COMMUNITY)
Admission: RE | Admit: 2016-06-03 | Discharge: 2016-06-03 | Disposition: A | Discharge status: Home / Self Care | Payer: BLUE CROSS/BLUE SHIELD | Source: Ambulatory Visit | Attending: Cardiology | Admitting: Cardiology

## 2016-06-03 ENCOUNTER — Encounter (HOSPITAL_COMMUNITY): Payer: Self-pay

## 2016-06-03 VITALS — Wt 259.8 lb

## 2016-06-03 DIAGNOSIS — G4733 Obstructive sleep apnea (adult) (pediatric): Secondary | ICD-10-CM | POA: Insufficient documentation

## 2016-06-03 DIAGNOSIS — Z7982 Long term (current) use of aspirin: Secondary | ICD-10-CM | POA: Diagnosis not present

## 2016-06-03 DIAGNOSIS — I110 Hypertensive heart disease with heart failure: Secondary | ICD-10-CM | POA: Insufficient documentation

## 2016-06-03 DIAGNOSIS — E6609 Other obesity due to excess calories: Secondary | ICD-10-CM

## 2016-06-03 DIAGNOSIS — E869 Volume depletion, unspecified: Secondary | ICD-10-CM | POA: Insufficient documentation

## 2016-06-03 DIAGNOSIS — I428 Other cardiomyopathies: Secondary | ICD-10-CM | POA: Insufficient documentation

## 2016-06-03 DIAGNOSIS — I959 Hypotension, unspecified: Secondary | ICD-10-CM | POA: Diagnosis not present

## 2016-06-03 DIAGNOSIS — Z79899 Other long term (current) drug therapy: Secondary | ICD-10-CM | POA: Diagnosis not present

## 2016-06-03 DIAGNOSIS — I272 Pulmonary hypertension, unspecified: Secondary | ICD-10-CM | POA: Insufficient documentation

## 2016-06-03 DIAGNOSIS — I5022 Chronic systolic (congestive) heart failure: Secondary | ICD-10-CM | POA: Insufficient documentation

## 2016-06-03 DIAGNOSIS — Z95810 Presence of automatic (implantable) cardiac defibrillator: Secondary | ICD-10-CM | POA: Insufficient documentation

## 2016-06-03 LAB — BASIC METABOLIC PANEL
Anion gap: 9 (ref 5–15)
BUN: 11 mg/dL (ref 6–20)
CO2: 26 mmol/L (ref 22–32)
Calcium: 9.8 mg/dL (ref 8.9–10.3)
Chloride: 102 mmol/L (ref 101–111)
Creatinine, Ser: 1.16 mg/dL — ABNORMAL HIGH (ref 0.44–1.00)
GFR calc Af Amer: >60 mL/min (ref >60)
GFR calc non Af Amer: 56 mL/min — ABNORMAL LOW (ref >60)
Glucose, Bld: 97 mg/dL (ref 65–99)
Potassium: 3.9 mmol/L (ref 3.5–5.1)
Sodium: 137 mmol/L (ref 135–145)

## 2016-06-03 LAB — BRAIN NATRIURETIC PEPTIDE: B Natriuretic Peptide: 206.7 pg/mL — ABNORMAL HIGH (ref 0.0–100.0)

## 2016-06-03 MED ORDER — FUROSEMIDE 40 MG PO TABS: 40.0000 mg | ORAL_TABLET | ORAL | 2 refills | Status: DC

## 2016-06-03 NOTE — Progress Notes (Signed)
Patient ID: [name], female   DOB: [birth year], 45 y.o.   MRN: [id number]  PCP: [facility] in [place] Cardiology: Dr. [name]  HPI: [name] is a 45 y.o. female with morbid obesity, HTN, OSA and systolic HF due to nonischemic cardiomyopathy s/p Medtronic ICD (2006).  She was followed for systolic CHF initially in [place]. Apparently her EF recovered to 35-40%. However, she was admitted to [facility] 09/19/13 for worsening dyspnea and CP. Coronaries were reportedly OK on LHC at that time at [facility]. Echo showed EF of 15-20% with four-chamber enlargement with moderate-severe TR/MR. RHC as below. Rheumatological w/u negative.  CPX in 9/16 showed near-normal functional capacity.  Repeat echo in 6/17 shows that EF remains 20%.   She presents today for regular follow up. Feeling OK apart from a lot of fatigue.  Weight down 11 lbs from last clinic visit. Has SOB when taking steps at home. Does get somewhat SOB after changing her clothes or showers. Not very active. Appetite slightly improved but remains low.  No dizziness or lightheadedness despite soft pressures. No near syncope. Sleeps with HOB elevated ~ 15-20 degrees.  Does have some bendopnea.   Optivol: Fluid index much decreased. Precipitous drop noted after recent adjustment of diuretics. Thoracic impedence above threshold. Pt activity ~ 0.5 hrs a day. No VT/VF.   RHC Procedural Findings: RA mean 5 RV 55/8 PA 56/31, mean 40 PCWP mean 24 Oxygen saturations: PA 61% AO 99% Cardiac Output (Fick) 4.3  Cardiac Index (Fick) 2.0 PVR 3.7 WU  RHC 09/24/13  RA 14  RV 57/22  PA 64/36 (48)  PCWP 18  Fick CO/CI: 3.7/1.6  PVR 8.1 WU  Ao sat 95%  PA sat 47%   CPX  11/09/13  FEV1 2.6L (85%)  FVC 2.1L (86%)  FEV1/FVC 86%  pVO2 14.8 (82%)  Slope 31.7%  RER 1.11  Ve/MVV 86%   Echo (8/15) with EF 20%, moderate LV dilation, diffuse hypokinesis, normal RV size with mildly decreased systolic function, mild MR.  Echo (5/16)  with EF 20%, spherical LV with diffuse hypokinesis, mild MR.  Echo (6/17) with EF 20%, diffuse hypokinesis, moderate LV dilation, normal RV, mild to moderate MR.   Sleep study did not show significant OSA.   RHC (8/15): Mean RA 3 PA 33/11 mean 19 Mean PCWP 8 CI 2.35  Holter (8/15) with < 1% PVCs  CPX 9/16 Peak VO2 16.2 VE/VCO2 slope 23.6 RER 1.25 Normal functional capacity  Labs: (11/09/13): Dig level 0.5, K 3.6, Creatinine 0.83, pro-BNP 622 (12/19/13): K 3.6, creatinine 0.8 (7/15): K 4.1, creatinine 0.91 (8/15): K 3.9, creatinine 0.91 (10/15): K 4, creatinine 0.93, proBNP 129 (12/15): K 4, creatinine 0.9, digoxin 0.4 (5/16): K 4, creatinine 0.86 (9/16): K 3.6, creatinine 0.86, digoxin < 0.2 (5/17): K 4.1, creatinine 0.95, digoxin 0.3  SH: Disabled since 2014. Lives her husband and 2 children. Does not drink alcohol or smoke   FH: Grandmother had heart failure   ROS: All systems negative except as listed in HPI, PMH and Problem List.  Past Medical History:  Diagnosis Date  . Automatic implantable cardioverter-defibrillator in situ   . Chronic CHF (congestive heart failure) (HCC)    a. EF 15-20% b. RHC (09/2013) RA 14, RV 57/22, PA 64/36 (48), PCWP 18, FIck CO/CI 3.7/1.6, PVR 8.1 WU, PA sat 47%   . History of stomach ulcers   . Hypertension   . Hypothyroidism   . Morbid obesity ([place])   . Myocardial  infarction 08/2013  . Nocturnal dyspnea   . Nonischemic cardiomyopathy ([place])   . Sinus tachycardia   . Snoring-prob OSA 09/04/2011  . Sprint Fidelis ICD lead RECALL  [id number]     Current Outpatient Prescriptions  Medication Sig Dispense Refill  . acetaminophen (TYLENOL) 500 MG tablet Take 1,000 mg by mouth daily as needed for moderate pain or headache.    Marland Kitchen aspirin 81 MG tablet Take 81 mg by mouth daily.    Marland Kitchen atorvastatin (LIPITOR) 20 MG tablet Take 1 tablet (20 mg total) by mouth daily. 60 tablet 6  . carvedilol (COREG) 12.5 MG tablet Take 1 tablet (12.5 mg total) by mouth  2 (two) times daily with a meal. 60 tablet 6  . digoxin (LANOXIN) 0.125 MG tablet TAKE 1 TABLET (125 MCG TOTAL) BY MOUTH DAILY. 30 tablet 6  . furosemide (LASIX) 40 MG tablet Take 80 mg by mouth 2 (two) times daily.    Marland Kitchen levothyroxine (SYNTHROID, LEVOTHROID) 88 MCG tablet Take 88 mcg by mouth daily before breakfast.    . potassium chloride SA (KLOR-CON M20) 20 MEQ tablet Take 1 tablet (20 mEq total) by mouth 2 (two) times daily. 60 tablet 6  . sacubitril-valsartan (ENTRESTO) 24-26 MG Take 1 tablet by mouth 2 (two) times daily. 60 tablet 6  . spironolactone (ALDACTONE) 25 MG tablet Take 1 tablet (25 mg total) by mouth daily. 30 tablet 6   No current facility-administered medications for this encounter.    Vitals:   06/03/16 1135  SpO2: 96%  Weight: 259 lb 12.8 oz (117.8 kg)  BP 84/62  HR 89    Wt Readings from Last 3 Encounters:  06/03/16 259 lb 12.8 oz (117.8 kg)  05/23/16 262 lb (118.8 kg)  05/20/16 271 lb 12.8 oz (123.3 kg)     PHYSICAL EXAM: General: Fatigued appearing. NAD.   HEENT: Normal Neck: supple. JVP flat. Carotids 2+ bilat; no bruits. No lymphadenopathy. Thyroid enlarged.   Cor: PMI nondisplaced. Regular rate and rhythm. No M/G/R. No edema.   Lungs: Clear, Normal effort Abdomen: Obese, soft, NT, ND, no HSM. No bruits or masses. +BS  Extremities: no cyanosis, clubbing, rash. Neuro: alert & oriented x 3, cranial nerves grossly intact. moves all 4 extremities w/o difficulty. Affect pleasant.  ASSESSMENT & PLAN:  1. Chronic systolic HF: Nonischemic cardiomyopathy.  5/16 echo showed EF 20% with diffuse hypokinesis.  She has a Medtronic ICD.  Cause for cardiomyopathy is uncertain: initially noted peri-partum (after son's birth), so cannot rule out peri-partum CMP.  She was noted at one point to have frequent PVCs but holter showed <1% PVCs.   RHC in 8/15 showed normal filling pressures and preserved cardiac output.  She does not qualify for CRT with narrow QRS.  CPX in 9/16  with near-normal functional capacity.  Repeat echo in 6/17 showed EF still around 20%.  - RHC with elevated left sided pressures with PVH and marginal cardiac index.  - NYHA class III-IIIb. Volume depleted by optivol and exam.   - Hold lasix and K x 2 days. New chronic dose LAsix 80 mg q am and 40 mg q pm starting 06/05/16.   - Continue current Coreg, spironolactone, digoxin, and Entresto.   - Think that she may be a good candidate for BEAT-HF (barostim trial), will connect her with our research staff.  - Will schedule for CPX.  2. Pulmonary HTN: Mixed picture with PVR 8.1 on RHC at [facility].  However, repeat study in 8/15 here showed  no significant pulmonary hypertension.  3. Morbid obesity: Has decided against gastric sleeve. Continue to work on weight loss. Needs to get back to exercise. 4. OSA:  - Sleep study with no significant OSA.   5. Hypotension/Volume Depletion - Pressures soft in clinic in asymptomatic.  She prefers to increase oral intake for next 1-2 days instead of IV fluid.  - Knows to call clinic with any symptoms of decreased urine output, darkening urine, or worsening dizziness/lightheadedness  Shirley Friar, PA-C 06/03/2016   Total time spent > 25 minutes. Over half that spent discussing the above.

## 2016-06-03 NOTE — Progress Notes (Signed)
Advanced Heart Failure Medication Review by a Pharmacist  Does the patient  feel that his/her medications are working for him/her?  yes  Has the patient been experiencing any side effects to the medications prescribed?  no  Does the patient measure his/her own blood pressure or blood glucose at home?  no   Does the patient have any problems obtaining medications due to transportation or finances?   no  Understanding of regimen: good Understanding of indications: good Potential of compliance: good Patient understands to avoid NSAIDs. Patient understands to avoid decongestants.  Issues to address at subsequent visits: None   Pharmacist comments:  [name] is a pleasant 45 yo F presenting without a medication list but with good recall of her regimen. She reports great compliance with her regimen and did not have any specific medication-related questions or concerns for me at this time.   Ruta Hinds. Velva Harman, PharmD, BCPS, CPP Clinical Pharmacist Pager: [phone] Phone: [phone] 06/03/2016 11:55 AM      Time with patient: 10 minutes Preparation and documentation time: 2 minutes Total time: 12 minutes

## 2016-06-03 NOTE — Patient Instructions (Signed)
HOLD Lasix and Potassium for 2 days CHANGE Lasix to Take 80mg  (2 tabs) in the AM and 40 mg (1 tab) in the PM And restart Potassium at previous dose  Your physician has recommended that you have a cardiopulmonary stress test (CPX). CPX testing is a non-invasive measurement of heart and lung function. It replaces a traditional treadmill stress test. This type of test provides a tremendous amount of information that relates not only to your present condition but also for future outcomes. This test combines measurements of you ventilation, respiratory gas exchange in the lungs, electrocardiogram (EKG), blood pressure and physical response before, during, and following an exercise protocol.  Labs today We will only contact you if something comes back abnormal or we need to make some changes. Otherwise no news is good news!  Your physician recommends that you schedule a follow-up appointment in: 4 weeks with Rebecca Eaton  Do the following things EVERYDAY: 1) Weigh yourself in the morning before breakfast. Write it down and keep it in a log. 2) Take your medicines as prescribed 3) Eat low salt foods-Limit salt (sodium) to 2000 mg per day.  4) Stay as active as you can everyday 5) Limit all fluids for the day to less than 2 liters

## 2016-06-09 ENCOUNTER — Other Ambulatory Visit: Payer: Self-pay | Admitting: Internal Medicine

## 2016-06-26 ENCOUNTER — Ambulatory Visit (HOSPITAL_COMMUNITY): Payer: BLUE CROSS/BLUE SHIELD | Attending: Cardiology

## 2016-06-26 DIAGNOSIS — I5022 Chronic systolic (congestive) heart failure: Secondary | ICD-10-CM | POA: Diagnosis not present

## 2016-06-27 ENCOUNTER — Other Ambulatory Visit (HOSPITAL_COMMUNITY): Payer: Self-pay | Admitting: *Deleted

## 2016-06-27 DIAGNOSIS — I5022 Chronic systolic (congestive) heart failure: Secondary | ICD-10-CM | POA: Diagnosis not present

## 2016-07-01 ENCOUNTER — Encounter (HOSPITAL_COMMUNITY): Payer: BLUE CROSS/BLUE SHIELD

## 2016-07-07 ENCOUNTER — Ambulatory Visit (HOSPITAL_COMMUNITY)
Admission: RE | Admit: 2016-07-07 | Discharge: 2016-07-07 | Disposition: A | Discharge status: Home / Self Care | Payer: BLUE CROSS/BLUE SHIELD | Source: Ambulatory Visit | Attending: Cardiology | Admitting: Cardiology

## 2016-07-07 ENCOUNTER — Telehealth (HOSPITAL_COMMUNITY): Payer: Self-pay | Admitting: *Deleted

## 2016-07-07 VITALS — BP 98/1 | HR 96 | Wt 264.4 lb

## 2016-07-07 DIAGNOSIS — I272 Pulmonary hypertension, unspecified: Secondary | ICD-10-CM | POA: Insufficient documentation

## 2016-07-07 DIAGNOSIS — I110 Hypertensive heart disease with heart failure: Secondary | ICD-10-CM | POA: Diagnosis not present

## 2016-07-07 DIAGNOSIS — E6609 Other obesity due to excess calories: Secondary | ICD-10-CM | POA: Diagnosis not present

## 2016-07-07 DIAGNOSIS — Z79899 Other long term (current) drug therapy: Secondary | ICD-10-CM | POA: Diagnosis not present

## 2016-07-07 DIAGNOSIS — Z7982 Long term (current) use of aspirin: Secondary | ICD-10-CM | POA: Diagnosis not present

## 2016-07-07 DIAGNOSIS — I5022 Chronic systolic (congestive) heart failure: Secondary | ICD-10-CM | POA: Diagnosis not present

## 2016-07-07 DIAGNOSIS — G4733 Obstructive sleep apnea (adult) (pediatric): Secondary | ICD-10-CM | POA: Insufficient documentation

## 2016-07-07 DIAGNOSIS — Z95810 Presence of automatic (implantable) cardiac defibrillator: Secondary | ICD-10-CM | POA: Diagnosis not present

## 2016-07-07 DIAGNOSIS — I428 Other cardiomyopathies: Secondary | ICD-10-CM | POA: Diagnosis not present

## 2016-07-07 DIAGNOSIS — I959 Hypotension, unspecified: Secondary | ICD-10-CM | POA: Diagnosis not present

## 2016-07-07 MED ORDER — IVABRADINE HCL 5 MG PO TABS: 2.5000 mg | ORAL_TABLET | Freq: Two times a day (BID) | ORAL | 3 refills | Status: DC

## 2016-07-07 NOTE — Progress Notes (Signed)
Patient ID: JOSE KAUBLE, female   DOB: 1971-05-13, 46 y.o.   MRN: [id number]  PCP: [facility] in [place] Cardiology: Dr. [name]  HPI: [name] is a 46 y.o. female with morbid obesity, HTN, OSA and systolic HF due to nonischemic cardiomyopathy s/p Medtronic ICD (2006).  She was followed for systolic CHF initially in [place]. Apparently her EF recovered to 35-40%. However, she was admitted to [facility] 09/19/13 for worsening dyspnea and CP. Coronaries were reportedly OK on LHC at that time at [facility]. Echo showed EF of 15-20% with four-chamber enlargement with moderate-severe TR/MR. RHC as below. Rheumatological w/u negative.  CPX in 9/16 showed near-normal functional capacity.  Repeat echo in 6/17 shows that EF remains 20%.   Todays she returns for HF follow up. Overall feeling ok. Weight at home 261-263 pounds. SOB walking around the grocery store. SOB with steps. + Orthopnea. Sleeps with HOB elevated. Denies dizziness. No fever or chills. Denies CP.  Taking all medications. Has not returned to cardiac rehab.    Optivol: Fluid index trending up. < 1 hour per day. No VT/VF. RHC Procedural Findings: RA mean 5 RV 55/8 PA 56/31, mean 40 PCWP mean 24 Oxygen saturations: PA 61% AO 99% Cardiac Output (Fick) 4.3  Cardiac Index (Fick) 2.0 PVR 3.7 WU  RHC 09/24/13  RA 14  RV 57/22  PA 64/36 (48)  PCWP 18  Fick CO/CI: 3.7/1.6  PVR 8.1 WU  Ao sat 95%  PA sat 47%   CPX 06/27/2016  Peak VO2: 13.5 (56.2% predicted peak VO2) VE/VCO2 slope: 33 OUES: 2.16 Peak RER: 0.98  CPX  11/09/13  FEV1 2.6L (85%)  FVC 2.1L (86%)  FEV1/FVC 86%  pVO2 14.8 (82%)  Slope 31.7%  RER 1.11  Ve/MVV 86%   Echo (8/15) with EF 20%, moderate LV dilation, diffuse hypokinesis, normal RV size with mildly decreased systolic function, mild MR.  Echo (5/16) with EF 20%, spherical LV with diffuse hypokinesis, mild MR.  Echo (6/17) with EF 20%, diffuse hypokinesis, moderate LV dilation, normal RV,  mild to moderate MR.   Sleep study did not show significant OSA.   RHC (8/15): Mean RA 3 PA 33/11 mean 19 Mean PCWP 8 CI 2.35  Holter (8/15) with < 1% PVCs  CPX 9/16 Peak VO2 16.2 VE/VCO2 slope 23.6 RER 1.25 Normal functional capacity  Labs: (11/09/13): Dig level 0.5, K 3.6, Creatinine 0.83, pro-BNP 622 (12/19/13): K 3.6, creatinine 0.8 (7/15): K 4.1, creatinine 0.91 (8/15): K 3.9, creatinine 0.91 (10/15): K 4, creatinine 0.93, proBNP 129 (12/15): K 4, creatinine 0.9, digoxin 0.4 (5/16): K 4, creatinine 0.86 (9/16): K 3.6, creatinine 0.86, digoxin < 0.2 (5/17): K 4.1, creatinine 0.95, digoxin 0.3  SH: Disabled since 2014. Lives her husband and 2 children. Does not drink alcohol or smoke   FH: Grandmother had heart failure   ROS: All systems negative except as listed in HPI, PMH and Problem List.  Past Medical History:  Diagnosis Date  . Automatic implantable cardioverter-defibrillator in situ   . Chronic CHF (congestive heart failure) (HCC)    a. EF 15-20% b. RHC (09/2013) RA 14, RV 57/22, PA 64/36 (48), PCWP 18, FIck CO/CI 3.7/1.6, PVR 8.1 WU, PA sat 47%   . History of stomach ulcers   . Hypertension   . Hypothyroidism   . Morbid obesity ([place])   . Myocardial infarction 08/2013  . Nocturnal dyspnea   . Nonischemic cardiomyopathy ([place])   . Sinus tachycardia   . Snoring-prob  OSA 09/04/2011  . Sprint Fidelis ICD lead RECALL  [id number]     Current Outpatient Prescriptions  Medication Sig Dispense Refill  . acetaminophen (TYLENOL) 500 MG tablet Take 1,000 mg by mouth daily as needed for moderate pain or headache.    Marland Kitchen aspirin 81 MG tablet Take 81 mg by mouth daily.    Marland Kitchen atorvastatin (LIPITOR) 20 MG tablet Take 1 tablet (20 mg total) by mouth daily. 60 tablet 6  . carvedilol (COREG) 12.5 MG tablet Take 1 tablet (12.5 mg total) by mouth 2 (two) times daily with a meal. 60 tablet 6  . digoxin (LANOXIN) 0.125 MG tablet TAKE 1 TABLET (125 MCG TOTAL) BY MOUTH DAILY. 30 tablet 6   . furosemide (LASIX) 40 MG tablet Take 1-2 tablets (40-80 mg total) by mouth as directed. Take 80mg  (2 tabs) in the AM and 40 mg (1 tab) in the PM 90 tablet 2  . levothyroxine (SYNTHROID, LEVOTHROID) 88 MCG tablet Take 88 mcg by mouth daily before breakfast.    . potassium chloride SA (KLOR-CON M20) 20 MEQ tablet Take 1 tablet (20 mEq total) by mouth 2 (two) times daily. 60 tablet 6  . sacubitril-valsartan (ENTRESTO) 24-26 MG Take 1 tablet by mouth 2 (two) times daily. 60 tablet 6  . spironolactone (ALDACTONE) 25 MG tablet Take 1 tablet (25 mg total) by mouth daily. 30 tablet 6   No current facility-administered medications for this encounter.    Vitals:   07/07/16 1220  BP: (!) 98/1  Pulse: 96  SpO2: 98%  Weight: 264 lb 6.4 oz (119.9 kg)    Wt Readings from Last 3 Encounters:  07/07/16 264 lb 6.4 oz (119.9 kg)  06/03/16 259 lb 12.8 oz (117.8 kg)  05/23/16 262 lb (118.8 kg)     PHYSICAL EXAM: General: NAD. Walked in the clinic.  NAD.   HEENT: Normal Neck: supple. JVP 5-6. Carotids 2+ bilat; no bruits. No lymphadenopathy. Thyroid enlarged.   Cor: PMI nondisplaced. Regular rate and rhythm. No M/G/R. No edema.   Lungs: CTABt Abdomen: Obese, soft, NT, ND, no HSM. No bruits or masses. +BS  Extremities: warm, no cyanosis, clubbing, rash. Neuro: alert & oriented x 3, cranial nerves grossly intact. moves all 4 extremities w/o difficulty. Affect pleasant.  ASSESSMENT & PLAN:  1. Chronic systolic HF: Nonischemic cardiomyopathy.  5/16 echo showed EF 20% with diffuse hypokinesis.  She has a Medtronic ICD.  Cause for cardiomyopathy is uncertain: initially noted peri-partum (after son's birth), so cannot rule out peri-partum CMP.  She was noted at one point to have frequent PVCs but holter showed <1% PVCs.   RHC in 8/15 showed normal filling pressures and preserved cardiac output.  She does not qualify for CRT with narrow QRS.   CPX 06/2016 with sub optimal effort. Limitations mostly from  obesity with minimal circulatory involvement. Today I discuss the results with she and her husband. - NYHA class III-IIIb. Volume status trending up.and verified with Optivol. Increase lasix to 80 mg twice a day for 2 days then back to 80 mg/40 mg.  Add 2.5 mg corlanor.    -- Continue current Coreg, spironolactone, digoxin, and Entresto.   I have asked her to go back to cardiac rehab. Needs increase activity.  -2. Pulmonary HTN: Mixed picture with PVR 8.1 on RHC at [facility].  However, repeat study in 8/15 here showed no significant pulmonary hypertension.  3. Morbid obesity: Has decided against gastric sleeve. Today we discussed portion control. Asked to return  to cardiac rehab.  4. OSA:  - Had sleep study with no significant OSA.    Follow up in 4 weeks.   Darrick Grinder, NP 07/07/2016

## 2016-07-07 NOTE — Telephone Encounter (Signed)
Called Cardiac Rehab and told them that patient may resume rehab per Darrick Grinder, NP.

## 2016-07-07 NOTE — Patient Instructions (Addendum)
Take Lasix 80 mg (2 Tablets) Two times daily for 2 days, then resume your dose of 80 mg in the AM and 40 mg in the PM.  START taking Corlanor 2.5 mg (0.5 Tablet) by mouth Two times daily  You may resume Cardiac Rehab  Follow up in 4 weeks.

## 2016-07-08 ENCOUNTER — Telehealth: Payer: Self-pay | Admitting: Cardiology

## 2016-07-08 ENCOUNTER — Ambulatory Visit (INDEPENDENT_AMBULATORY_CARE_PROVIDER_SITE_OTHER): Payer: BLUE CROSS/BLUE SHIELD | Admitting: *Deleted

## 2016-07-08 DIAGNOSIS — I428 Other cardiomyopathies: Secondary | ICD-10-CM

## 2016-07-08 NOTE — Progress Notes (Signed)
Remote ICD transmission.   

## 2016-07-08 NOTE — Telephone Encounter (Signed)
Spoke with pt and reminded pt of remote transmission that is due today. Pt verbalized understanding.   

## 2016-07-12 LAB — CUP PACEART REMOTE DEVICE CHECK
Battery Remaining Longevity: 122 mo
Battery Voltage: 3 V
Brady Statistic RV Percent Paced: 0.01 %
Date Time Interrogation Session: 20180116184655
HighPow Impedance: 70 Ohm
Implantable Lead Implant Date: 20150706
Implantable Lead Location: 753860
Implantable Pulse Generator Implant Date: 20150706
Lead Channel Impedance Value: 399 Ohm
Lead Channel Impedance Value: 513 Ohm
Lead Channel Pacing Threshold Amplitude: 0.625 V
Lead Channel Pacing Threshold Pulse Width: 0.4 ms
Lead Channel Sensing Intrinsic Amplitude: 9 mV
Lead Channel Sensing Intrinsic Amplitude: 9 mV
Lead Channel Setting Pacing Amplitude: 2 V
Lead Channel Setting Pacing Pulse Width: 0.4 ms
Lead Channel Setting Sensing Sensitivity: 0.3 mV

## 2016-07-15 ENCOUNTER — Telehealth (HOSPITAL_COMMUNITY): Payer: Self-pay | Admitting: Pharmacist

## 2016-07-15 NOTE — Telephone Encounter (Signed)
Corlanor 5 mg BID PA approved by [employer] commercial through 06/22/38.  Ruta Hinds. Velva Harman, PharmD, BCPS, CPP Clinical Pharmacist Pager: [phone] Phone: [phone] 07/15/2016 11:31 AM

## 2016-07-16 ENCOUNTER — Encounter: Payer: Self-pay | Admitting: Cardiology

## 2016-07-18 ENCOUNTER — Telehealth (HOSPITAL_COMMUNITY): Payer: Self-pay | Admitting: *Deleted

## 2016-07-18 NOTE — Telephone Encounter (Signed)
Opened in error

## 2016-07-30 ENCOUNTER — Other Ambulatory Visit: Payer: Self-pay | Admitting: Internal Medicine

## 2016-08-04 ENCOUNTER — Encounter (HOSPITAL_COMMUNITY): Payer: Self-pay

## 2016-08-04 ENCOUNTER — Ambulatory Visit (HOSPITAL_COMMUNITY)
Admission: RE | Admit: 2016-08-04 | Discharge: 2016-08-04 | Disposition: A | Discharge status: Home / Self Care | Payer: BLUE CROSS/BLUE SHIELD | Source: Ambulatory Visit | Attending: Internal Medicine | Admitting: Internal Medicine

## 2016-08-04 VITALS — BP 88/66 | HR 94 | Wt 260.4 lb

## 2016-08-04 DIAGNOSIS — Z8249 Family history of ischemic heart disease and other diseases of the circulatory system: Secondary | ICD-10-CM | POA: Insufficient documentation

## 2016-08-04 DIAGNOSIS — I110 Hypertensive heart disease with heart failure: Secondary | ICD-10-CM | POA: Insufficient documentation

## 2016-08-04 DIAGNOSIS — Z95810 Presence of automatic (implantable) cardiac defibrillator: Secondary | ICD-10-CM | POA: Insufficient documentation

## 2016-08-04 DIAGNOSIS — I5022 Chronic systolic (congestive) heart failure: Secondary | ICD-10-CM | POA: Diagnosis not present

## 2016-08-04 DIAGNOSIS — I272 Pulmonary hypertension, unspecified: Secondary | ICD-10-CM | POA: Diagnosis not present

## 2016-08-04 DIAGNOSIS — I252 Old myocardial infarction: Secondary | ICD-10-CM | POA: Diagnosis not present

## 2016-08-04 DIAGNOSIS — Z8719 Personal history of other diseases of the digestive system: Secondary | ICD-10-CM | POA: Insufficient documentation

## 2016-08-04 DIAGNOSIS — Z6841 Body Mass Index (BMI) 40.0 and over, adult: Secondary | ICD-10-CM | POA: Insufficient documentation

## 2016-08-04 DIAGNOSIS — G4733 Obstructive sleep apnea (adult) (pediatric): Secondary | ICD-10-CM | POA: Diagnosis not present

## 2016-08-04 DIAGNOSIS — E6609 Other obesity due to excess calories: Secondary | ICD-10-CM

## 2016-08-04 DIAGNOSIS — I493 Ventricular premature depolarization: Secondary | ICD-10-CM | POA: Insufficient documentation

## 2016-08-04 DIAGNOSIS — E039 Hypothyroidism, unspecified: Secondary | ICD-10-CM | POA: Insufficient documentation

## 2016-08-04 DIAGNOSIS — Z7982 Long term (current) use of aspirin: Secondary | ICD-10-CM | POA: Insufficient documentation

## 2016-08-04 DIAGNOSIS — I429 Cardiomyopathy, unspecified: Secondary | ICD-10-CM | POA: Diagnosis not present

## 2016-08-04 DIAGNOSIS — I428 Other cardiomyopathies: Secondary | ICD-10-CM | POA: Diagnosis not present

## 2016-08-04 MED ORDER — IVABRADINE HCL 5 MG PO TABS: 5.0000 mg | ORAL_TABLET | Freq: Two times a day (BID) | ORAL | 3 refills | Status: DC

## 2016-08-04 NOTE — Progress Notes (Signed)
Patient ID: [name], female   DOB: 1970/09/06, 46 y.o.   MRN: [id number]  PCP: [facility] in [place] Cardiology: Dr. [name]  HPI: [name] is a 46 y.o. 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  She was followed for systolic CHF initially in [place]. Apparently her EF recovered to 35-40%. However, she was admitted to [facility] 09/19/13 for worsening dyspnea and CP. Coronaries were reportedly OK on LHC at that time at [facility]. Echo showed EF of 15-20% with four-chamber enlargement with moderate-severe TR/MR. RHC as below. Rheumatological w/u negative.  CPX in 9/16 showed near-normal functional capacity.  Repeat echo in 6/17 shows that EF remains 20%.   Today she returns for HF follow up. Overall feeling ok. Last visit corlanor added and increased lasix for a couple of days. Denies SOB and PND. + Orthopnea. No fever or chills. Not walking much. Taking all medications. Having difficulty paying for corlanor. Weight at home 257-261 pounds.  Optivol: Fluid index at threshold.  < 1 hour per day. No VT/VF.  RHC Procedural Findings: RA mean 5 RV 55/8 PA 56/31, mean 40 PCWP mean 24 Oxygen saturations: PA 61% AO 99% Cardiac Output (Fick) 4.3  Cardiac Index (Fick) 2.0 PVR 3.7 WU  RHC 09/24/13  RA 14  RV 57/22  PA 64/36 (48)  PCWP 18  Fick CO/CI: 3.7/1.6  PVR 8.1 WU  Ao sat 95%  PA sat 47%   CPX 06/27/2016  Peak VO2: 13.5 (56.2% predicted peak VO2) VE/VCO2 slope: 33 OUES: 2.16 Peak RER: 0.98  CPX  11/09/13  FEV1 2.6L (85%)  FVC 2.1L (86%)  FEV1/FVC 86%  pVO2 14.8 (82%)  Slope 31.7%  RER 1.11  Ve/MVV 86%   Echo (8/15) with EF 20%, moderate LV dilation, diffuse hypokinesis, normal RV size with mildly decreased systolic function, mild MR.  Echo (5/16) with EF 20%, spherical LV with diffuse hypokinesis, mild MR.  Echo (6/17) with EF 20%, diffuse hypokinesis, moderate LV  dilation, normal RV, mild to moderate MR.   Sleep study did not show significant OSA.   RHC (8/15): Mean RA 3 PA 33/11 mean 19 Mean PCWP 8 CI 2.35  Holter (8/15) with < 1% PVCs  CPX 9/16 Peak VO2 16.2 VE/VCO2 slope 23.6 RER 1.25 Normal functional capacity  Labs: (11/09/13): Dig level 0.5, K 3.6, Creatinine 0.83, pro-BNP 622 (12/19/13): K 3.6, creatinine 0.8 (7/15): K 4.1, creatinine 0.91 (8/15): K 3.9, creatinine 0.91 (10/15): K 4, creatinine 0.93, proBNP 129 (12/15): K 4, creatinine 0.9, digoxin 0.4 (5/16): K 4, creatinine 0.86 (9/16): K 3.6, creatinine 0.86, digoxin < 0.2 (5/17): K 4.1, creatinine 0.95, digoxin 0.3  SH: Disabled since 2014. Lives her husband and 2 children. Does not drink alcohol or smoke   FH: Grandmother had heart failure   ROS: All systems negative except as listed in HPI, PMH and Problem List.  Past Medical History:  Diagnosis Date  . Automatic implantable cardioverter-defibrillator in situ   . Chronic CHF (congestive heart failure) (HCC)    a. EF 15-20% b. RHC (09/2013) RA 14, RV 57/22, PA 64/36 (48), PCWP 18, FIck CO/CI 3.7/1.6, PVR 8.1 WU, PA sat 47%   . History of stomach ulcers   . Hypertension   . Hypothyroidism   . Morbid obesity ([place])   . Myocardial infarction 08/2013  . Nocturnal dyspnea   . Nonischemic cardiomyopathy ([place])   . Sinus tachycardia   .  Snoring-prob OSA 09/04/2011  . Sprint Fidelis ICD lead RECALL  [id number]     Current Outpatient Prescriptions  Medication Sig Dispense Refill  . acetaminophen (TYLENOL) 500 MG tablet Take 1,000 mg by mouth daily as needed for moderate pain or headache.    Marland Kitchen aspirin 81 MG tablet Take 81 mg by mouth daily.    Marland Kitchen atorvastatin (LIPITOR) 20 MG tablet Take 1 tablet (20 mg total) by mouth daily. 60 tablet 6  . carvedilol (COREG) 12.5 MG tablet Take 1 tablet (12.5 mg total) by mouth 2 (two) times daily with a meal. 60 tablet 6  . digoxin (LANOXIN) 0.125 MG tablet TAKE 1 TABLET (125 MCG TOTAL) BY MOUTH  DAILY. 30 tablet 6  . furosemide (LASIX) 40 MG tablet Take 1-2 tablets (40-80 mg total) by mouth as directed. Take 80mg  (2 tabs) in the AM and 40 mg (1 tab) in the PM 90 tablet 2  . ivabradine (CORLANOR) 5 MG TABS tablet Take 0.5 tablets (2.5 mg total) by mouth 2 (two) times daily with a meal. 30 tablet 3  . levothyroxine (SYNTHROID, LEVOTHROID) 88 MCG tablet Take 88 mcg by mouth daily before breakfast.    . potassium chloride SA (KLOR-CON M20) 20 MEQ tablet Take 1 tablet (20 mEq total) by mouth 2 (two) times daily. 60 tablet 6  . sacubitril-valsartan (ENTRESTO) 24-26 MG Take 1 tablet by mouth 2 (two) times daily. 60 tablet 6  . spironolactone (ALDACTONE) 25 MG tablet Take 1 tablet (25 mg total) by mouth daily. 30 tablet 6   No current facility-administered medications for this encounter.    Vitals:   08/04/16 1042  BP: (!) 88/66  Pulse: 94  SpO2: 98%  Weight: 260 lb 6.4 oz (118.1 kg)    Wt Readings from Last 3 Encounters:  08/04/16 260 lb 6.4 oz (118.1 kg)  07/07/16 264 lb 6.4 oz (119.9 kg)  06/03/16 259 lb 12.8 oz (117.8 kg)     PHYSICAL EXAM: No other changes in PE today 08/04/2016  General: NAD. Marland Kitchen   HEENT: Normal Neck: supple. JVP 5-6. Carotids 2+ bilat; no bruits. No lymphadenopathy. Thyroid enlarged.   Cor: PMI nondisplaced. Regular rate and rhythm. No M/G/R. No edema.   Lungs: CTABt Abdomen: Obese, soft, NT, ND, no HSM. No bruits or masses. +BS  Extremities: warm, no cyanosis, clubbing, rash.R and LLE no edema.  Neuro: alert & oriented x 3, cranial nerves grossly intact. moves all 4 extremities w/o difficulty. Affect pleasant.  ASSESSMENT & PLAN:  1. Chronic systolic HF: Nonischemic cardiomyopathy.  5/16 echo showed EF 20% with diffuse hypokinesis.  She has a Medtronic ICD.  Cause for cardiomyopathy is uncertain: initially noted peri-partum (after son's birth), so cannot rule out peri-partum CMP.  She was noted at one point to have frequent PVCs but holter showed <1% PVCs.    RHC in 8/15 showed normal filling pressures and preserved cardiac output.  She does not qualify for CRT with narrow QRS.   CPX 06/2016 with sub optimal effort. Limitations mostly from obesity with minimal circulatory involvement.  Optivol : Volume ok. Activity < 1 hour per day. Discussed re-starting cardiac rehab however she declines.  - NYHA class II-III. Volume status stable. Continue lasix 80 mg /40 mg daily.  SBP soft no room to increase bb or entresto.  Increase corlanor to 5 mg twice a day. Provided with corlanor coupon today .   -- Continue current spironolactone and digoxin.  -2. Pulmonary HTN: Mixed picture with PVR  8.1 on RHC at [facility].  However, repeat study in 8/15 here showed no significant pulmonary hypertension.  3. Morbid obesity: Needs to lose significant amount of weight . Encouraged to resume cardiac rehab however she declines.  Body mass index is 44.7 kg/m. 4. OSA:  - Negative . Had sleep study with no significant OSA.    Follow up in 6 weeks. Plan to repeat BMET and dig level next visit.   Greater than 50% of the 25 minutes spent in counseling/coordination of care regarding  Heart Failure and increasing activity.   Miranda Grinder, NP 08/04/2016

## 2016-08-04 NOTE — Patient Instructions (Signed)
INCREASE Corlanor to 5 mg (1 whole tablet) twice daily. Activate Corlanor savings card to use and save!  No lab work today.  Follow up 6 weeks with Amy Clegg NP-C.  Do the following things EVERYDAY: 1) Weigh yourself in the morning before breakfast. Write it down and keep it in a log. 2) Take your medicines as prescribed 3) Eat low salt foods-Limit salt (sodium) to 2000 mg per day.  4) Stay as active as you can everyday 5) Limit all fluids for the day to less than 2 liters

## 2016-08-06 ENCOUNTER — Other Ambulatory Visit (HOSPITAL_COMMUNITY): Payer: Self-pay | Admitting: Cardiology

## 2016-08-22 ENCOUNTER — Other Ambulatory Visit: Payer: Self-pay | Admitting: Internal Medicine

## 2016-09-03 ENCOUNTER — Telehealth (HOSPITAL_COMMUNITY): Payer: Self-pay | Admitting: Family Medicine

## 2016-09-03 NOTE — Telephone Encounter (Signed)
Verified BCBS & Medicare A/B insurance benefits through [employer]port No Co-Pay, No Co-Insurance,   Deductible $1150.00, amt met [id number], pt's responsibility $56.62 Out of Pocket $500.00, pt's responsibility $500.00. Reference [phone] & [phone].... KJ

## 2016-09-15 ENCOUNTER — Ambulatory Visit (HOSPITAL_COMMUNITY)
Admission: RE | Admit: 2016-09-15 | Discharge: 2016-09-15 | Disposition: A | Discharge status: Home / Self Care | Payer: BLUE CROSS/BLUE SHIELD | Source: Ambulatory Visit | Attending: Internal Medicine | Admitting: Internal Medicine

## 2016-09-15 ENCOUNTER — Encounter (HOSPITAL_COMMUNITY): Payer: Self-pay

## 2016-09-15 VITALS — BP 100/80 | HR 100 | Wt 259.6 lb

## 2016-09-15 DIAGNOSIS — R0683 Snoring: Secondary | ICD-10-CM | POA: Diagnosis not present

## 2016-09-15 DIAGNOSIS — E6609 Other obesity due to excess calories: Secondary | ICD-10-CM

## 2016-09-15 DIAGNOSIS — I252 Old myocardial infarction: Secondary | ICD-10-CM | POA: Insufficient documentation

## 2016-09-15 DIAGNOSIS — I110 Hypertensive heart disease with heart failure: Secondary | ICD-10-CM | POA: Insufficient documentation

## 2016-09-15 DIAGNOSIS — Z7982 Long term (current) use of aspirin: Secondary | ICD-10-CM | POA: Diagnosis not present

## 2016-09-15 DIAGNOSIS — I429 Cardiomyopathy, unspecified: Secondary | ICD-10-CM | POA: Diagnosis not present

## 2016-09-15 DIAGNOSIS — I493 Ventricular premature depolarization: Secondary | ICD-10-CM | POA: Insufficient documentation

## 2016-09-15 DIAGNOSIS — Z6841 Body Mass Index (BMI) 40.0 and over, adult: Secondary | ICD-10-CM | POA: Insufficient documentation

## 2016-09-15 DIAGNOSIS — Z8719 Personal history of other diseases of the digestive system: Secondary | ICD-10-CM | POA: Insufficient documentation

## 2016-09-15 DIAGNOSIS — I428 Other cardiomyopathies: Secondary | ICD-10-CM

## 2016-09-15 DIAGNOSIS — Z95810 Presence of automatic (implantable) cardiac defibrillator: Secondary | ICD-10-CM | POA: Diagnosis not present

## 2016-09-15 DIAGNOSIS — I272 Pulmonary hypertension, unspecified: Secondary | ICD-10-CM | POA: Insufficient documentation

## 2016-09-15 DIAGNOSIS — I5022 Chronic systolic (congestive) heart failure: Secondary | ICD-10-CM | POA: Diagnosis not present

## 2016-09-15 DIAGNOSIS — E039 Hypothyroidism, unspecified: Secondary | ICD-10-CM | POA: Diagnosis not present

## 2016-09-15 DIAGNOSIS — Z8249 Family history of ischemic heart disease and other diseases of the circulatory system: Secondary | ICD-10-CM | POA: Insufficient documentation

## 2016-09-15 LAB — BASIC METABOLIC PANEL
Anion gap: 8 (ref 5–15)
BUN: 11 mg/dL (ref 6–20)
CO2: 24 mmol/L (ref 22–32)
Calcium: 9.5 mg/dL (ref 8.9–10.3)
Chloride: 107 mmol/L (ref 101–111)
Creatinine, Ser: 1.02 mg/dL — ABNORMAL HIGH (ref 0.44–1.00)
GFR calc Af Amer: >60 mL/min (ref >60)
GFR calc non Af Amer: >60 mL/min (ref >60)
Glucose, Bld: 87 mg/dL (ref 65–99)
Potassium: 4.1 mmol/L (ref 3.5–5.1)
Sodium: 139 mmol/L (ref 135–145)

## 2016-09-15 MED ORDER — CARVEDILOL 12.5 MG PO TABS: ORAL_TABLET | ORAL | 6 refills | Status: DC

## 2016-09-15 MED ORDER — IVABRADINE HCL 5 MG PO TABS: 7.5000 mg | ORAL_TABLET | Freq: Two times a day (BID) | ORAL | 3 refills | Status: DC

## 2016-09-15 NOTE — Progress Notes (Signed)
[name]: [name], female   DOB: [birth year], 46 y.o.   MRN: [id number]  PCP: [name] in [place] Cardiology: Dr. [name]  HPI: [name] is a 46 y.o. 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  She was followed for systolic CHF initially in [place]. Apparently her EF recovered to 35-40%. However, she was admitted to [facility] 09/19/13 for worsening dyspnea and CP. Coronaries were reportedly OK on LHC at that time at [facility]. Echo showed EF of 15-20% with four-chamber enlargement with moderate-severe TR/MR. RHC as below. Rheumatological w/u negative.  CPX in 9/16 showed near-normal functional capacity.  Repeat echo in 6/17 shows that EF remains 20%.   Today she returns for HF follow up. Last visit corlanor was increased to 5 mg twice a day. Overall feeling better. Having dry cough every now and then. Denies SOB/PND/Orthopnea. Does admit to mild dyspnea with steps.  Weight at home 255 which is down a few pounds.Walking a little more.  No CP. Appetite ok. Drinking < 2 liters. Eating lots pretzels. Taking all medications.   Optivol: Fluid index above the  threshold.  < 1 hour per day. No VT/VF.  RHC Procedural Findings: RA mean 5 RV 55/8 PA 56/31, mean 40 PCWP mean 24 Oxygen saturations: PA 61% AO 99% Cardiac Output (Fick) 4.3  Cardiac Index (Fick) 2.0 PVR 3.7 WU  RHC 09/24/13  RA 14  RV 57/22  PA 64/36 (48)  PCWP 18  Fick CO/CI: 3.7/1.6  PVR 8.1 WU  Ao sat 95%  PA sat 47%   CPX 06/27/2016  Peak VO2: 13.5 (56.2% predicted peak VO2) VE/VCO2 slope: 33 OUES: 2.16 Peak RER: 0.98  CPX  11/09/13  FEV1 2.6L (85%)  FVC 2.1L (86%)  FEV1/FVC 86%  pVO2 14.8 (82%)  Slope 31.7%  RER 1.11  Ve/MVV 86%   Echo (8/15) with EF 20%, moderate LV dilation, diffuse hypokinesis, normal RV size with mildly decreased systolic function, mild MR.  Echo (5/16) with EF 20%, spherical LV  with diffuse hypokinesis, mild MR.  Echo (6/17) with EF 20%, diffuse hypokinesis, moderate LV dilation, normal RV, mild to moderate MR.   Sleep study did not show significant OSA.   RHC (8/15): Mean RA 3 PA 33/11 mean 19 Mean PCWP 8 CI 2.35  Holter (8/15) with < 1% PVCs  CPX 9/16 Peak VO2 16.2 VE/VCO2 slope 23.6 RER 1.25 Normal functional capacity  Labs: (11/09/13): Dig level 0.5, K 3.6, Creatinine 0.83, pro-BNP 622 (12/19/13): K 3.6, creatinine 0.8 (7/15): K 4.1, creatinine 0.91 (8/15): K 3.9, creatinine 0.91 (10/15): K 4, creatinine 0.93, proBNP 129 (12/15): K 4, creatinine 0.9, digoxin 0.4 (5/16): K 4, creatinine 0.86 (9/16): K 3.6, creatinine 0.86, digoxin < 0.2 (5/17): K 4.1, creatinine 0.95, digoxin 0.3  SH: Disabled since 2014. Lives her husband and 2 children. Does not drink alcohol or smoke   FH: Grandmother had heart failure   ROS: All systems negative except as listed in HPI, PMH and Problem List.  Past Medical History:  Diagnosis Date  . Automatic implantable cardioverter-defibrillator in situ   . Chronic CHF (congestive heart failure) (HCC)    a. EF 15-20% b. RHC (09/2013) RA 14, RV 57/22, PA 64/36 (48), PCWP 18, FIck CO/CI 3.7/1.6, PVR 8.1 WU, PA sat 47%   . History of stomach ulcers   . Hypertension   . Hypothyroidism   . Morbid obesity ([place])   .  Myocardial infarction 08/2013  . Nocturnal dyspnea   . Nonischemic cardiomyopathy ([place])   . Sinus tachycardia   . Snoring-prob OSA 09/04/2011  . Sprint Fidelis ICD lead RECALL  [id number]     Current Outpatient Prescriptions  Medication Sig Dispense Refill  . acetaminophen (TYLENOL) 500 MG tablet Take 1,000 mg by mouth daily as needed for moderate pain or headache.    Marland Kitchen aspirin 81 MG tablet Take 81 mg by mouth daily.    Marland Kitchen atorvastatin (LIPITOR) 20 MG tablet Take 1 tablet (20 mg total) by mouth daily. 60 tablet 6  . carvedilol (COREG) 12.5 MG tablet Take 1 tablet (12.5 mg total) by mouth 2 (two) times daily with a  meal. 60 tablet 6  . digoxin (LANOXIN) 0.125 MG tablet TAKE 1 TABLET (125 MCG TOTAL) BY MOUTH DAILY. 30 tablet 6  . ENTRESTO 24-26 MG TAKE 1 TABLET BY MOUTH TWICE (2) DAILY 60 tablet 3  . furosemide (LASIX) 40 MG tablet Take 1-2 tablets (40-80 mg total) by mouth as directed. Take 80mg  (2 tabs) in the AM and 40 mg (1 tab) in the PM 90 tablet 2  . ivabradine (CORLANOR) 5 MG TABS tablet Take 1 tablet (5 mg total) by mouth 2 (two) times daily with a meal. 60 tablet 3  . levothyroxine (SYNTHROID, LEVOTHROID) 88 MCG tablet Take 88 mcg by mouth daily before breakfast.    . potassium chloride SA (KLOR-CON M20) 20 MEQ tablet Take 1 tablet (20 mEq total) by mouth 2 (two) times daily. 60 tablet 6  . spironolactone (ALDACTONE) 25 MG tablet Take 1 tablet (25 mg total) by mouth daily. 30 tablet 6   No current facility-administered medications for this encounter.    Vitals:   09/15/16 1033  BP: 100/80  Pulse: 100  SpO2: 100%  Weight: 259 lb 9.6 oz (117.8 kg)    Wt Readings from Last 3 Encounters:  09/15/16 259 lb 9.6 oz (117.8 kg)  08/04/16 260 lb 6.4 oz (118.1 kg)  07/07/16 264 lb 6.4 oz (119.9 kg)     PHYSICAL EXAM: General: NAD. Ambulated in the clinic HEENT: Normal Neck: supple. JVP 9-10  Carotids 2+ bilat; no bruits. No lymphadenopathy. Thyroid enlarged.   Cor: PMI nondisplaced. Regular rate and rhythm. No M/G/R. No edema.   Lungs: CTAB  Abdomen: Obese, soft, NT, ND, no HSM. No bruits or masses. +BS  Extremities: warm, no cyanosis, clubbing, rash. R and LLE  no edema.  Neuro: alert & oriented x 3, cranial nerves grossly intact. moves all 4 extremities w/o difficulty. Affect pleasant.  ASSESSMENT & PLAN: 1. Chronic systolic HF: Nonischemic cardiomyopathy.  5/16 echo showed EF 20% with diffuse hypokinesis.  She has a Medtronic ICD.  Cause for cardiomyopathy is uncertain: initially noted peri-partum (after son's birth), so cannot rule out peri-partum CMP.  She was noted at one point to have  frequent PVCs but holter showed <1% PVCs.   RHC in 8/15 showed normal filling pressures and preserved cardiac output.  She does not qualify for CRT with narrow QRS.   CPX 06/2016 with sub optimal effort. Limitations mostly from obesity with minimal circulatory involvement.  Optivol : Volume ok. Activity < 1 hour per day. Discussed re-starting cardiac rehab however she declines.  - NYHA class II-III. Volume status mildly elevated in the setting of high salt snacks. I have asked her to take an extra 40 mg lasix today.Volume status elevated.  Continue lasix 80 mg /40 mg daily.  Continue carvedilol 12.5  mg in the am and increase evening dose 18.75 mg .  Continue current dose of entresto.  Increase corlanor to 7.5 mg twice a day.  Continue current spironolactone and digoxin.  BMET today.  Do the following things EVERYDAY: 1) Weigh yourself in the morning before breakfast. Write it down and keep it in a log. 2) Take your medicines as prescribed 3) Eat low salt foods-Limit salt (sodium) to 2000 mg per day.  4) Stay as active as you can everyday 5) Limit all fluids for the day to less than 2 liters -2. Pulmonary HTN: Mixed picture with PVR 8.1 on RHC at [facility].  However, repeat study in 8/15 here showed no significant pulmonary hypertension.  3. Morbid obesity: Needs to lose significant amount of weight . Discussed portion control.   Body mass index is 44.56 kg/m. 4. OSA:  - Negative . Had sleep study with no significant OSA.    Follow up in 2 months. I have discussed low diet and asked her to increase activity.    Greater than 50% of the (total minutes 25) visit spent in counseling/coordination of care regarding heart failure and low salt diet.   Darrick Grinder, NP 09/15/2016

## 2016-09-15 NOTE — Patient Instructions (Signed)
INCREASE Corlanor to 7.5 mg (1.5 tabs) twice daily.  INCREASE Carvedilol (Coreg) to 12.5 mg (1 tab) in am and 18.75 mg (1.5) tabs in pm.  Routine lab work today. Will notify you of abnormal results, otherwise no news is good news!  Follow up 6-8 weeks with Amy Clegg NP-C.  Do the following things EVERYDAY: 1) Weigh yourself in the morning before breakfast. Write it down and keep it in a log. 2) Take your medicines as prescribed 3) Eat low salt foods-Limit salt (sodium) to 2000 mg per day.  4) Stay as active as you can everyday 5) Limit all fluids for the day to less than 2 liters

## 2016-10-02 ENCOUNTER — Other Ambulatory Visit (HOSPITAL_COMMUNITY): Payer: Self-pay | Admitting: Cardiology

## 2016-10-07 ENCOUNTER — Ambulatory Visit (INDEPENDENT_AMBULATORY_CARE_PROVIDER_SITE_OTHER): Payer: BLUE CROSS/BLUE SHIELD | Admitting: *Deleted

## 2016-10-07 DIAGNOSIS — I428 Other cardiomyopathies: Secondary | ICD-10-CM | POA: Diagnosis not present

## 2016-10-07 NOTE — Progress Notes (Signed)
Remote ICD transmission.   

## 2016-10-09 LAB — CUP PACEART REMOTE DEVICE CHECK
Battery Remaining Longevity: 120 mo
Battery Voltage: 3.01 V
Brady Statistic RV Percent Paced: 0.01 %
Date Time Interrogation Session: 20180417062604
HighPow Impedance: 76 Ohm
Implantable Lead Implant Date: 20150706
Implantable Lead Location: 753860
Implantable Pulse Generator Implant Date: 20150706
Lead Channel Impedance Value: 513 Ohm
Lead Channel Impedance Value: 608 Ohm
Lead Channel Pacing Threshold Amplitude: 0.5 V
Lead Channel Pacing Threshold Pulse Width: 0.4 ms
Lead Channel Sensing Intrinsic Amplitude: 9.75 mV
Lead Channel Sensing Intrinsic Amplitude: 9.75 mV
Lead Channel Setting Pacing Amplitude: 2 V
Lead Channel Setting Pacing Pulse Width: 0.4 ms
Lead Channel Setting Sensing Sensitivity: 0.3 mV

## 2016-10-10 ENCOUNTER — Encounter: Payer: Self-pay | Admitting: Cardiology

## 2016-11-01 ENCOUNTER — Other Ambulatory Visit (HOSPITAL_COMMUNITY): Payer: Self-pay | Admitting: Cardiology

## 2016-11-10 ENCOUNTER — Ambulatory Visit (HOSPITAL_COMMUNITY)
Admission: RE | Admit: 2016-11-10 | Discharge: 2016-11-10 | Disposition: A | Discharge status: Home / Self Care | Payer: BLUE CROSS/BLUE SHIELD | Source: Ambulatory Visit | Attending: Cardiology | Admitting: Cardiology

## 2016-11-10 ENCOUNTER — Encounter (HOSPITAL_COMMUNITY): Payer: Self-pay

## 2016-11-10 VITALS — BP 82/64 | HR 71 | Wt 259.2 lb

## 2016-11-10 DIAGNOSIS — Z7982 Long term (current) use of aspirin: Secondary | ICD-10-CM | POA: Diagnosis not present

## 2016-11-10 DIAGNOSIS — I252 Old myocardial infarction: Secondary | ICD-10-CM | POA: Insufficient documentation

## 2016-11-10 DIAGNOSIS — R42 Dizziness and giddiness: Secondary | ICD-10-CM | POA: Insufficient documentation

## 2016-11-10 DIAGNOSIS — I493 Ventricular premature depolarization: Secondary | ICD-10-CM | POA: Diagnosis not present

## 2016-11-10 DIAGNOSIS — I428 Other cardiomyopathies: Secondary | ICD-10-CM | POA: Diagnosis not present

## 2016-11-10 DIAGNOSIS — I110 Hypertensive heart disease with heart failure: Secondary | ICD-10-CM | POA: Diagnosis not present

## 2016-11-10 DIAGNOSIS — E039 Hypothyroidism, unspecified: Secondary | ICD-10-CM | POA: Insufficient documentation

## 2016-11-10 DIAGNOSIS — Z8249 Family history of ischemic heart disease and other diseases of the circulatory system: Secondary | ICD-10-CM | POA: Insufficient documentation

## 2016-11-10 DIAGNOSIS — I959 Hypotension, unspecified: Secondary | ICD-10-CM | POA: Diagnosis not present

## 2016-11-10 DIAGNOSIS — R5383 Other fatigue: Secondary | ICD-10-CM | POA: Diagnosis not present

## 2016-11-10 DIAGNOSIS — I5022 Chronic systolic (congestive) heart failure: Secondary | ICD-10-CM | POA: Diagnosis not present

## 2016-11-10 DIAGNOSIS — G4733 Obstructive sleep apnea (adult) (pediatric): Secondary | ICD-10-CM | POA: Diagnosis not present

## 2016-11-10 DIAGNOSIS — I429 Cardiomyopathy, unspecified: Secondary | ICD-10-CM | POA: Insufficient documentation

## 2016-11-10 DIAGNOSIS — Z6841 Body Mass Index (BMI) 40.0 and over, adult: Secondary | ICD-10-CM | POA: Diagnosis not present

## 2016-11-10 DIAGNOSIS — Z95810 Presence of automatic (implantable) cardiac defibrillator: Secondary | ICD-10-CM | POA: Diagnosis not present

## 2016-11-10 DIAGNOSIS — I272 Pulmonary hypertension, unspecified: Secondary | ICD-10-CM | POA: Insufficient documentation

## 2016-11-10 DIAGNOSIS — R0602 Shortness of breath: Secondary | ICD-10-CM | POA: Insufficient documentation

## 2016-11-10 DIAGNOSIS — E6609 Other obesity due to excess calories: Secondary | ICD-10-CM

## 2016-11-10 DIAGNOSIS — Z8719 Personal history of other diseases of the digestive system: Secondary | ICD-10-CM | POA: Insufficient documentation

## 2016-11-10 LAB — BASIC METABOLIC PANEL
Anion gap: 7 (ref 5–15)
BUN: 9 mg/dL (ref 6–20)
CO2: 26 mmol/L (ref 22–32)
Calcium: 9 mg/dL (ref 8.9–10.3)
Chloride: 105 mmol/L (ref 101–111)
Creatinine, Ser: 0.95 mg/dL (ref 0.44–1.00)
GFR calc Af Amer: >60 mL/min (ref >60)
GFR calc non Af Amer: >60 mL/min (ref >60)
Glucose, Bld: 94 mg/dL (ref 65–99)
Potassium: 3.4 mmol/L — ABNORMAL LOW (ref 3.5–5.1)
Sodium: 138 mmol/L (ref 135–145)

## 2016-11-10 LAB — TSH: TSH: 7.068 u[IU]/mL — ABNORMAL HIGH (ref 0.350–4.500)

## 2016-11-10 MED ORDER — POTASSIUM CHLORIDE CRYS ER 20 MEQ PO TBCR: 20.0000 meq | EXTENDED_RELEASE_TABLET | Freq: Every day | ORAL | 2 refills | Status: DC

## 2016-11-10 MED ORDER — FUROSEMIDE 40 MG PO TABS: 80.0000 mg | ORAL_TABLET | Freq: Every day | ORAL | 2 refills | Status: DC

## 2016-11-10 NOTE — Patient Instructions (Addendum)
DECREASE Lasix to 80mg  daily DECREASE Potassium to 20 meq daily    Labs today We will only contact you if something comes back abnormal or we need to make some changes. Otherwise no news is good news!   Your physician recommends that you schedule a follow-up appointment in: 3 months with Dr [name] and a echocardiogram  Your physician has requested that you have an echocardiogram. Echocardiography is a painless test that uses sound waves to create images of your heart. It provides your doctor with information about the size and shape of your heart and how well your heart's chambers and valves are working. This procedure takes approximately one hour. There are no restrictions for this procedure.  Do the following things EVERYDAY: 1) Weigh yourself in the morning before breakfast. Write it down and keep it in a log. 2) Take your medicines as prescribed 3) Eat low salt foods-Limit salt (sodium) to 2000 mg per day.  4) Stay as active as you can everyday 5) Limit all fluids for the day to less than 2 liters

## 2016-11-10 NOTE — Progress Notes (Signed)
Patient ID: [name], female   DOB: July 08, 1970, 46 y.o.   MRN: [id number]  PCP: [facility] in [place] Cardiology: Dr. [name]  HPI: [name] is a 46 y.o. 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  She was followed for systolic CHF initially in [place]. Apparently her EF recovered to 35-40%. However, she was admitted to [facility] 09/19/13 for worsening dyspnea and CP. Coronaries were reportedly OK on LHC at that time at [facility]. Echo showed EF of 15-20% with four-chamber enlargement with moderate-severe TR/MR. RHC as below. Rheumatological w/u negative.  CPX in 9/16 showed near-normal functional capacity.  Repeat echo in 6/17 shows that EF remains 20%.   She returns today for HF follow up. Overall feeling well. Her weight is the same as it was 2 months ago. SOB with walking to the mailbox at times. No SOB with ADL's. Weights at home 253-254 pounds. Taking all medications, she is feeling tired at times. She feels like she is unable to start an exercise routine due to fatigue. She is eating low salt foods, drinking less than 2L a day. She is grilling her meat and meal prepping for the week. Does have some dizziness when she goes from sitting to standing about once a week.  Optivol: fluid below threshold, activity less than one hour a day. No VT/VF.   RHC Procedural Findings: RA mean 5 RV 55/8 PA 56/31, mean 40 PCWP mean 24 Oxygen saturations: PA 61% AO 99% Cardiac Output (Fick) 4.3  Cardiac Index (Fick) 2.0 PVR 3.7 WU  RHC 09/24/13  RA 14  RV 57/22  PA 64/36 (48)  PCWP 18  Fick CO/CI: 3.7/1.6  PVR 8.1 WU  Ao sat 95%  PA sat 47%   CPX 06/27/2016  Peak VO2: 13.5 (56.2% predicted peak VO2) VE/VCO2 slope: 33 OUES: 2.16 Peak RER: 0.98  CPX  11/09/13  FEV1 2.6L (85%)  FVC 2.1L (86%)  FEV1/FVC 86%  pVO2 14.8 (82%)  Slope 31.7%  RER 1.11  Ve/MVV 86%   Echo  (8/15) with EF 20%, moderate LV dilation, diffuse hypokinesis, normal RV size with mildly decreased systolic function, mild MR.  Echo (5/16) with EF 20%, spherical LV with diffuse hypokinesis, mild MR.  Echo (6/17) with EF 20%, diffuse hypokinesis, moderate LV dilation, normal RV, mild to moderate MR.   Sleep study did not show significant OSA.   RHC (8/15): Mean RA 3 PA 33/11 mean 19 Mean PCWP 8 CI 2.35  Holter (8/15) with < 1% PVCs  CPX 9/16 Peak VO2 16.2 VE/VCO2 slope 23.6 RER 1.25 Normal functional capacity  Labs: (11/09/13): Dig level 0.5, K 3.6, Creatinine 0.83, pro-BNP 622 (12/19/13): K 3.6, creatinine 0.8 (7/15): K 4.1, creatinine 0.91 (8/15): K 3.9, creatinine 0.91 (10/15): K 4, creatinine 0.93, proBNP 129 (12/15): K 4, creatinine 0.9, digoxin 0.4 (5/16): K 4, creatinine 0.86 (9/16): K 3.6, creatinine 0.86, digoxin < 0.2 (5/17): K 4.1, creatinine 0.95, digoxin 0.3  SH: Disabled since 2014. Lives her husband and 2 children. Does not drink alcohol or smoke   FH: Grandmother had heart failure   ROS: All systems negative except as listed in HPI, PMH and Problem List.  Past Medical History:  Diagnosis Date  . Automatic implantable cardioverter-defibrillator in situ   . Chronic CHF (congestive heart failure) (HCC)    a. EF 15-20% b. RHC (09/2013) RA 14, RV 57/22, PA  64/36 (48), PCWP 18, FIck CO/CI 3.7/1.6, PVR 8.1 WU, PA sat 47%   . History of stomach ulcers   . Hypertension   . Hypothyroidism   . Morbid obesity ([place])   . Myocardial infarction ([place]) 08/2013  . Nocturnal dyspnea   . Nonischemic cardiomyopathy ([place])   . Sinus tachycardia   . Snoring-prob OSA 09/04/2011  . Sprint Fidelis ICD lead RECALL  [id number]     Current Outpatient Prescriptions  Medication Sig Dispense Refill  . acetaminophen (TYLENOL) 500 MG tablet Take 1,000 mg by mouth daily as needed for moderate pain or headache.    Marland Kitchen aspirin 81 MG tablet Take 81 mg by mouth daily.    Marland Kitchen atorvastatin  (LIPITOR) 20 MG tablet Take 1 tablet (20 mg total) by mouth daily. 60 tablet 6  . carvedilol (COREG) 12.5 MG tablet Take 12.5 mg (1 tab) in am and 18.75 mg (1.5 tabs) in pm. 75 tablet 6  . digoxin (LANOXIN) 0.125 MG tablet TAKE 1 TABLET BY MOUTH ONCE DAILY 30 tablet 6  . ENTRESTO 24-26 MG TAKE 1 TABLET BY MOUTH TWICE (2) DAILY 60 tablet 3  . furosemide (LASIX) 40 MG tablet Take 1-2 tablets (40-80 mg total) by mouth as directed. Take 80mg  (2 tabs) in the AM and 40 mg (1 tab) in the PM 90 tablet 2  . ivabradine (CORLANOR) 5 MG TABS tablet Take 1.5 tablets (7.5 mg total) by mouth 2 (two) times daily with a meal. 90 tablet 3  . levothyroxine (SYNTHROID, LEVOTHROID) 88 MCG tablet Take 88 mcg by mouth daily before breakfast.    . potassium chloride SA (KLOR-CON M20) 20 MEQ tablet Take 1 tablet (20 mEq total) by mouth 2 (two) times daily. 60 tablet 6  . spironolactone (ALDACTONE) 25 MG tablet TAKE 1 TABLET BY MOUTH ONCE DAILY 30 tablet 6   No current facility-administered medications for this encounter.    Vitals:   11/10/16 1021  BP: (!) 82/64  Pulse: 71  SpO2: 100%  Weight: 259 lb 3.2 oz (117.6 kg)    Wt Readings from Last 3 Encounters:  11/10/16 259 lb 3.2 oz (117.6 kg)  09/15/16 259 lb 9.6 oz (117.8 kg)  08/04/16 260 lb 6.4 oz (118.1 kg)     PHYSICAL EXAM: General: obese female, NAD. Ambulated into clinic without difficulty.  HEENT: Normal Neck: supple. No JVP  Carotids 2+ bilat; no bruits. No lymphadenopathy.    Cor: PMI nondisplaced. Regular rate and rhythm. No M/G/R. No edema.   Lungs: Clear bilaterally, no wheeze or cough.  Abdomen: Obese, soft, NT, ND, no HSM. No bruits or masses. + bowel sounds.  Extremities: warm, no cyanosis, clubbing, rash. No lower extremity edema.  Neuro: alert & oriented x 3, cranial nerves grossly intact. moves all 4 extremities w/o difficulty. Affect pleasant.  ASSESSMENT & PLAN: 1. Chronic systolic HF: Nonischemic cardiomyopathy.  5/16 echo showed EF  20% with diffuse hypokinesis.  She has a Medtronic ICD.  Cause for cardiomyopathy is uncertain: initially noted peri-partum (after son's birth), so cannot rule out peri-partum CMP.  She was noted at one point to have frequent PVCs but holter showed <1% PVCs.   RHC in 8/15 showed normal filling pressures and preserved cardiac output.  She does not qualify for CRT with narrow QRS.   CPX 06/2016 with sub optimal effort. Limitations mostly from obesity with minimal circulatory involvement.  - EF 20% in June 2017.  - NYHA II-III - Volume status stable to dry. Orthostatic  vitals - sitting 78/56, standing 86/64. Will decrease lasix to 80mg  daily. Will also reduce Kcl to 20MEq daily since we are reducing lasix.  - Continue Entresto 24/26mg  BID.  - continue corlanor to 7.5 mg twice a day.  - Continue current spironolactone and digoxin.  - BMET today. - Follow up in 3 months with Echo.  2. Pulmonary HTN: Mixed picture with PVR 8.1 on RHC at [facility].  However, repeat study in 8/15 here showed no significant pulmonary hypertension.  - Stable.  3. Morbid obesity: Needs to lose significant amount of weight . Discussed portion control.   Body mass index is 44.49 kg/m.  - talked to her at length today about increasing her activity. Instructed her to start with at least 5 minutes every day, then increase activity weekly. She says that fatigue limits her, will check TSH today and if elevated she can follow up with her PCP.  4. OSA:  - Negative . Had sleep study with no significant OSA.   5. Fatigue - Will check TSH - Suspect that her fatigue will resolve some if she increases her activity.   Arbutus Leas, NP 11/10/2016   Greater than 50% of the (total minutes 25) visit spent in counseling/coordination of care regarding heart failure and low salt diet.

## 2016-11-13 ENCOUNTER — Telehealth (HOSPITAL_COMMUNITY): Payer: Self-pay | Admitting: *Deleted

## 2016-11-13 DIAGNOSIS — R7989 Other specified abnormal findings of blood chemistry: Secondary | ICD-10-CM

## 2016-11-13 NOTE — Telephone Encounter (Signed)
-----   Message from Larey Dresser, MD sent at 11/11/2016 10:56 PM EDT ----- Elevated TSH, needs repeat TSH with free T3 and free T4.  K mildly low, increase dietary K.

## 2016-11-14 ENCOUNTER — Ambulatory Visit (HOSPITAL_COMMUNITY)
Admission: RE | Admit: 2016-11-14 | Discharge: 2016-11-14 | Disposition: A | Discharge status: Home / Self Care | Payer: BLUE CROSS/BLUE SHIELD | Source: Ambulatory Visit | Attending: Internal Medicine | Admitting: Internal Medicine

## 2016-11-14 DIAGNOSIS — R946 Abnormal results of thyroid function studies: Secondary | ICD-10-CM | POA: Insufficient documentation

## 2016-11-14 DIAGNOSIS — R7989 Other specified abnormal findings of blood chemistry: Secondary | ICD-10-CM

## 2016-11-14 LAB — TSH: TSH: 5.995 u[IU]/mL — ABNORMAL HIGH (ref 0.350–4.500)

## 2016-11-14 LAB — T4, FREE: Free T4: 1.02 ng/dL (ref 0.61–1.12)

## 2016-11-15 LAB — T3, FREE: T3, Free: 2.8 pg/mL (ref 2.0–4.4)

## 2016-11-26 ENCOUNTER — Other Ambulatory Visit: Payer: Self-pay | Admitting: Internal Medicine

## 2017-01-01 ENCOUNTER — Other Ambulatory Visit (HOSPITAL_COMMUNITY): Payer: Self-pay | Admitting: Student

## 2017-01-01 DIAGNOSIS — I5022 Chronic systolic (congestive) heart failure: Secondary | ICD-10-CM

## 2017-01-06 ENCOUNTER — Ambulatory Visit (INDEPENDENT_AMBULATORY_CARE_PROVIDER_SITE_OTHER): Payer: BLUE CROSS/BLUE SHIELD | Admitting: *Deleted

## 2017-01-06 DIAGNOSIS — I428 Other cardiomyopathies: Secondary | ICD-10-CM

## 2017-01-06 NOTE — Progress Notes (Signed)
Remote ICD transmission.   

## 2017-01-07 ENCOUNTER — Encounter: Payer: Self-pay | Admitting: Cardiology

## 2017-01-08 LAB — CUP PACEART REMOTE DEVICE CHECK
Battery Remaining Longevity: 117 mo
Battery Voltage: 3.01 V
Brady Statistic RV Percent Paced: 0.01 %
Date Time Interrogation Session: 20180717071807
HighPow Impedance: 67 Ohm
Implantable Lead Implant Date: 20150706
Implantable Lead Location: 753860
Implantable Pulse Generator Implant Date: 20150706
Lead Channel Impedance Value: 494 Ohm
Lead Channel Impedance Value: 570 Ohm
Lead Channel Pacing Threshold Amplitude: 0.375 V
Lead Channel Pacing Threshold Pulse Width: 0.4 ms
Lead Channel Sensing Intrinsic Amplitude: 9 mV
Lead Channel Sensing Intrinsic Amplitude: 9 mV
Lead Channel Setting Pacing Amplitude: 2 V
Lead Channel Setting Pacing Pulse Width: 0.4 ms
Lead Channel Setting Sensing Sensitivity: 0.3 mV

## 2017-02-09 ENCOUNTER — Other Ambulatory Visit (HOSPITAL_COMMUNITY): Payer: Self-pay | Admitting: Internal Medicine

## 2017-02-09 ENCOUNTER — Other Ambulatory Visit (HOSPITAL_COMMUNITY): Payer: Self-pay | Admitting: Cardiology

## 2017-02-10 ENCOUNTER — Ambulatory Visit (HOSPITAL_COMMUNITY)
Admission: RE | Admit: 2017-02-10 | Discharge: 2017-02-10 | Disposition: A | Discharge status: Home / Self Care | Payer: BLUE CROSS/BLUE SHIELD | Source: Ambulatory Visit | Attending: Internal Medicine | Admitting: Internal Medicine

## 2017-02-10 ENCOUNTER — Encounter (HOSPITAL_COMMUNITY): Payer: Self-pay | Admitting: Cardiology

## 2017-02-10 ENCOUNTER — Ambulatory Visit (HOSPITAL_BASED_OUTPATIENT_CLINIC_OR_DEPARTMENT_OTHER)
Admission: RE | Admit: 2017-02-10 | Discharge: 2017-02-10 | Disposition: A | Discharge status: Home / Self Care | Payer: BLUE CROSS/BLUE SHIELD | Source: Ambulatory Visit | Attending: Cardiology | Admitting: Cardiology

## 2017-02-10 VITALS — BP 107/59 | HR 71 | Wt 267.5 lb

## 2017-02-10 DIAGNOSIS — I272 Pulmonary hypertension, unspecified: Secondary | ICD-10-CM | POA: Insufficient documentation

## 2017-02-10 DIAGNOSIS — I110 Hypertensive heart disease with heart failure: Secondary | ICD-10-CM | POA: Insufficient documentation

## 2017-02-10 DIAGNOSIS — Z95810 Presence of automatic (implantable) cardiac defibrillator: Secondary | ICD-10-CM | POA: Insufficient documentation

## 2017-02-10 DIAGNOSIS — Z6841 Body Mass Index (BMI) 40.0 and over, adult: Secondary | ICD-10-CM | POA: Insufficient documentation

## 2017-02-10 DIAGNOSIS — I5022 Chronic systolic (congestive) heart failure: Secondary | ICD-10-CM

## 2017-02-10 DIAGNOSIS — E039 Hypothyroidism, unspecified: Secondary | ICD-10-CM | POA: Diagnosis not present

## 2017-02-10 DIAGNOSIS — Z8719 Personal history of other diseases of the digestive system: Secondary | ICD-10-CM | POA: Diagnosis not present

## 2017-02-10 DIAGNOSIS — Z7982 Long term (current) use of aspirin: Secondary | ICD-10-CM | POA: Diagnosis not present

## 2017-02-10 DIAGNOSIS — Z8249 Family history of ischemic heart disease and other diseases of the circulatory system: Secondary | ICD-10-CM | POA: Insufficient documentation

## 2017-02-10 DIAGNOSIS — I429 Cardiomyopathy, unspecified: Secondary | ICD-10-CM | POA: Insufficient documentation

## 2017-02-10 DIAGNOSIS — I493 Ventricular premature depolarization: Secondary | ICD-10-CM | POA: Insufficient documentation

## 2017-02-10 DIAGNOSIS — I252 Old myocardial infarction: Secondary | ICD-10-CM | POA: Diagnosis not present

## 2017-02-10 LAB — BASIC METABOLIC PANEL
Anion gap: 6 (ref 5–15)
BUN: 10 mg/dL (ref 6–20)
CO2: 28 mmol/L (ref 22–32)
Calcium: 9.3 mg/dL (ref 8.9–10.3)
Chloride: 104 mmol/L (ref 101–111)
Creatinine, Ser: 0.97 mg/dL (ref 0.44–1.00)
GFR calc Af Amer: >60 mL/min (ref >60)
GFR calc non Af Amer: >60 mL/min (ref >60)
Glucose, Bld: 97 mg/dL (ref 65–99)
Potassium: 3.9 mmol/L (ref 3.5–5.1)
Sodium: 138 mmol/L (ref 135–145)

## 2017-02-10 LAB — BRAIN NATRIURETIC PEPTIDE: B Natriuretic Peptide: 107.6 pg/mL — ABNORMAL HIGH (ref 0.0–100.0)

## 2017-02-10 MED ORDER — FUROSEMIDE 40 MG PO TABS: ORAL_TABLET | ORAL | 3 refills | Status: DC

## 2017-02-10 MED ORDER — CARVEDILOL 12.5 MG PO TABS: 18.7500 mg | ORAL_TABLET | Freq: Two times a day (BID) | ORAL | 6 refills | Status: DC

## 2017-02-10 NOTE — Progress Notes (Signed)
  2D Echocardiogram has been performed.  Tresa Res 02/10/2017, 9:28 AM

## 2017-02-10 NOTE — Patient Instructions (Signed)
Increase Carvedilol to 18.75 mg (1 & 1/2 tabs) Twice daily   Labs today  Your physician recommends that you schedule a follow-up appointment in: 3 months

## 2017-02-11 NOTE — Progress Notes (Signed)
Patient ID: [name], female   DOB: [birth year], 46 y.o.   MRN: [id number]  PCP: [facility] in [place] Cardiology: Dr. [name]  HPI: [name] is a 46 y.o. 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  She was followed for systolic CHF initially in [place]. Apparently her EF recovered to 35-40%. However, she was admitted to [facility] 09/19/13 for worsening dyspnea and CP. Coronaries were reportedly OK on LHC at that time at [facility]. Echo showed EF of 15-20% with four-chamber enlargement with moderate-severe TR/MR. RHC as below. Rheumatological w/u negative.  CPX in 9/16 showed near-normal functional capacity.  Repeat echo in 6/17 shows that EF remains 20%. Repeat CPX in 1/18 was submaximal but probably only mild circulatory limitation.  Echo done today shows EF 25-30%, severe LV dilation.    She returns today for HF follow up. At last visit, she was orthostatic.  Today, no lightheadedness.  Weight is up about 8 lbs, says her appetite is back.  Overall, she feels like she is doing better.  Dyspnea walking up a flight of stairs but no problems on flat ground.  No chest pain.  No palpitations.  No orthopnea/PND.    Optivol: Impedance stable, fluid index < threshold.  45 min-1 hr activity.  No VT.    RHC Procedural Findings: RA mean 5 RV 55/8 PA 56/31, mean 40 PCWP mean 24 Oxygen saturations: PA 61% AO 99% Cardiac Output (Fick) 4.3  Cardiac Index (Fick) 2.0 PVR 3.7 WU  RHC 09/24/13  RA 14  RV 57/22  PA 64/36 (48)  PCWP 18  Fick CO/CI: 3.7/1.6  PVR 8.1 WU  Ao sat 95%  PA sat 47%   CPX 06/27/2016  Peak VO2: 13.5 (56.2% predicted peak VO2) VE/VCO2 slope: 33 OUES: 2.16 Peak RER: 0.98  CPX  1/18 Peak VO2 13.5 VE/VCO2 slope 33 RER 0.98 Submaximal study, mild circulatory limitation, suspect more limited by body habitus.   Echo (8/15) with EF 20%, moderate LV dilation,  diffuse hypokinesis, normal RV size with mildly decreased systolic function, mild MR.  Echo (5/16) with EF 20%, spherical LV with diffuse hypokinesis, mild MR.  Echo (6/17) with EF 20%, diffuse hypokinesis, moderate LV dilation, normal RV, mild to moderate MR.  Echo (8/18) with EF 25-30%, severe LV dilation, severe LAE.   Sleep study did not show significant OSA.   RHC (8/15): Mean RA 3 PA 33/11 mean 19 Mean PCWP 8 CI 2.35  Holter (8/15) with < 1% PVCs  CPX 9/16 Peak VO2 16.2 VE/VCO2 slope 23.6 RER 1.25 Normal functional capacity  Labs: (11/09/13): Dig level 0.5, K 3.6, Creatinine 0.83, pro-BNP 622 (12/19/13): K 3.6, creatinine 0.8 (7/15): K 4.1, creatinine 0.91 (8/15): K 3.9, creatinine 0.91 (10/15): K 4, creatinine 0.93, proBNP 129 (12/15): K 4, creatinine 0.9, digoxin 0.4 (5/16): K 4, creatinine 0.86 (9/16): K 3.6, creatinine 0.86, digoxin < 0.2 (5/17): K 4.1, creatinine 0.95, digoxin 0.3 (5/18): K 3.4, creatinine 0.95, TSH elevated but free T3 and free T4 normal.   SH: Disabled since 2014. Lives her husband and 2 children. Does not drink alcohol or smoke   FH: Grandmother had heart failure   ROS: All systems negative except as listed in HPI, PMH and Problem List.  Past Medical History:  Diagnosis Date  . Automatic implantable cardioverter-defibrillator in situ   . Chronic CHF (congestive heart failure) ([place])    a. EF  15-20% b. RHC (09/2013) RA 14, RV 57/22, PA 64/36 (48), PCWP 18, FIck CO/CI 3.7/1.6, PVR 8.1 WU, PA sat 47%   . History of stomach ulcers   . Hypertension   . Hypothyroidism   . Morbid obesity ([place])   . Myocardial infarction ([place]) 08/2013  . Nocturnal dyspnea   . Nonischemic cardiomyopathy ([place])   . Sinus tachycardia   . Snoring-prob OSA 09/04/2011  . Sprint Fidelis ICD lead RECALL  [id number]     Current Outpatient Prescriptions  Medication Sig Dispense Refill  . acetaminophen (TYLENOL) 500 MG tablet Take 1,000 mg by mouth daily as needed for moderate  pain or headache.    Marland Kitchen aspirin 81 MG tablet Take 81 mg by mouth daily.    Marland Kitchen atorvastatin (LIPITOR) 20 MG tablet Take 1 tablet (20 mg total) by mouth daily. 60 tablet 6  . carvedilol (COREG) 12.5 MG tablet Take 1.5 tablets (18.75 mg total) by mouth 2 (two) times daily with a meal. 90 tablet 6  . digoxin (LANOXIN) 0.125 MG tablet TAKE 1 TABLET BY MOUTH ONCE DAILY 30 tablet 6  . furosemide (LASIX) 40 MG tablet Take 1 tab in AM and 0.5 tab in PM 90 tablet 3  . ivabradine (CORLANOR) 5 MG TABS tablet Take 1.5 tablets (7.5 mg total) by mouth 2 (two) times daily with a meal. 90 tablet 3  . levothyroxine (SYNTHROID, LEVOTHROID) 88 MCG tablet Take 88 mcg by mouth daily before breakfast.    . potassium chloride SA (K-DUR,KLOR-CON) 20 MEQ tablet TAKE 1 TABLET BY MOUTH ONCE DAILY 60 tablet 6  . spironolactone (ALDACTONE) 25 MG tablet TAKE 1 TABLET BY MOUTH ONCE DAILY 30 tablet 6  . ENTRESTO 24-26 MG TAKE 1 TABLET BY MOUTH TWICE DAILY 60 tablet 6   No current facility-administered medications for this encounter.    Vitals:   02/10/17 0930  BP: (!) 107/59  Pulse: 71  SpO2: 100%  Weight: 267 lb 8 oz (121.3 kg)    Wt Readings from Last 3 Encounters:  02/10/17 267 lb 8 oz (121.3 kg)  11/10/16 259 lb 3.2 oz (117.6 kg)  09/15/16 259 lb 9.6 oz (117.8 kg)     PHYSICAL EXAM: General: NAD, obese Neck: No JVD, no thyromegaly or thyroid nodule.  Lungs: Clear to auscultation bilaterally with normal respiratory effort. CV: Nondisplaced PMI.  Heart regular S1/S2, no S3/S4, no murmur.  No peripheral edema.  No carotid bruit.  Normal pedal pulses.  Abdomen: Soft, nontender, no hepatosplenomegaly, no distention.  Skin: Intact without lesions or rashes.  Neurologic: Alert and oriented x 3.  Psych: Normal affect. Extremities: No clubbing or cyanosis.  HEENT: Normal.   ASSESSMENT & PLAN: 1. Chronic systolic HF: Nonischemic cardiomyopathy.  She has a Medtronic ICD.  Cause for cardiomyopathy is uncertain:  initially noted peri-partum (after son's birth), so cannot rule out peri-partum CMP.  She was noted at one point to have frequent PVCs but holter showed <1% PVCs.   RHC in 8/15 showed normal filling pressures and preserved cardiac output.  She does not qualify for CRT with narrow QRS.  CPX 06/2016 with submaximal effort, limitation mostly from obesity with mild circulatory limitation.  Today's echo was reviewed, EF 25-30% with severe LV dilation. She does not look volume overloaded on exam.  NYHA class II, improved overall.   - Continue Lasix 40 qam/20 qpm.  BMET today. - Continue Entresto 24/26mg  BID.  - continue corlanor to 7.5 mg twice a day.  - Continue  current spironolactone and digoxin, check digoxin level.  - Increase Coreg to 18.75 mg bid.  2. Pulmonary HTN: Mixed picture with PVR 8.1 on RHC at [facility].  However, repeat study in 8/15 here showed no significant pulmonary hypertension.  3. Morbid obesity: Needs to lose significant amount of weight . Discussed portion control.   Body mass index is 45.92 kg/m.   [name] 02/11/2017

## 2017-02-19 ENCOUNTER — Other Ambulatory Visit: Payer: Self-pay | Admitting: Internal Medicine

## 2017-04-07 ENCOUNTER — Ambulatory Visit (INDEPENDENT_AMBULATORY_CARE_PROVIDER_SITE_OTHER): Payer: BLUE CROSS/BLUE SHIELD | Admitting: *Deleted

## 2017-04-07 DIAGNOSIS — I428 Other cardiomyopathies: Secondary | ICD-10-CM | POA: Diagnosis not present

## 2017-04-07 NOTE — Progress Notes (Signed)
Remote ICD transmission.   

## 2017-04-09 LAB — CUP PACEART REMOTE DEVICE CHECK
Battery Remaining Longevity: 113 mo
Battery Voltage: 3.01 V
Brady Statistic RV Percent Paced: 0.01 %
Date Time Interrogation Session: 20181016103326
HighPow Impedance: 76 Ohm
Implantable Lead Implant Date: 20150706
Implantable Lead Location: 753860
Implantable Pulse Generator Implant Date: 20150706
Lead Channel Impedance Value: 494 Ohm
Lead Channel Impedance Value: 608 Ohm
Lead Channel Pacing Threshold Amplitude: 0.375 V
Lead Channel Pacing Threshold Pulse Width: 0.4 ms
Lead Channel Sensing Intrinsic Amplitude: 10.375 mV
Lead Channel Sensing Intrinsic Amplitude: 10.375 mV
Lead Channel Setting Pacing Amplitude: 2 V
Lead Channel Setting Pacing Pulse Width: 0.4 ms
Lead Channel Setting Sensing Sensitivity: 0.3 mV

## 2017-04-10 ENCOUNTER — Encounter: Payer: Self-pay | Admitting: Cardiology

## 2017-05-04 ENCOUNTER — Other Ambulatory Visit (HOSPITAL_COMMUNITY): Payer: Self-pay | Admitting: Internal Medicine

## 2017-05-04 ENCOUNTER — Other Ambulatory Visit (HOSPITAL_COMMUNITY): Payer: Self-pay | Admitting: Adult Health

## 2017-05-04 DIAGNOSIS — I5022 Chronic systolic (congestive) heart failure: Secondary | ICD-10-CM

## 2017-05-05 ENCOUNTER — Other Ambulatory Visit (HOSPITAL_COMMUNITY): Payer: Self-pay

## 2017-05-05 ENCOUNTER — Telehealth (HOSPITAL_COMMUNITY): Payer: Self-pay

## 2017-05-05 MED ORDER — IVABRADINE HCL 7.5 MG PO TABS: 7.5000 mg | ORAL_TABLET | Freq: Two times a day (BID) | ORAL | 6 refills | Status: DC

## 2017-05-05 NOTE — Telephone Encounter (Signed)
Received call to CHF clinic triage line from [facility] requesting new Rx be sent for 7.5 mg tablets of Corlanor instead of the 5 mg tablets to be cheaper for patient. New Rx sent, old discontinued, and med list updated to patient's chart.  Renee Pain, RN

## 2017-05-21 ENCOUNTER — Encounter (HOSPITAL_COMMUNITY): Payer: Self-pay | Admitting: Cardiology

## 2017-05-21 ENCOUNTER — Ambulatory Visit (HOSPITAL_COMMUNITY)
Admission: RE | Admit: 2017-05-21 | Discharge: 2017-05-21 | Disposition: A | Discharge status: Home / Self Care | Payer: BLUE CROSS/BLUE SHIELD | Source: Ambulatory Visit | Attending: Cardiology | Admitting: Cardiology

## 2017-05-21 ENCOUNTER — Other Ambulatory Visit: Payer: Self-pay

## 2017-05-21 VITALS — BP 99/50 | HR 76 | Wt 276.5 lb

## 2017-05-21 DIAGNOSIS — I429 Cardiomyopathy, unspecified: Secondary | ICD-10-CM | POA: Diagnosis not present

## 2017-05-21 DIAGNOSIS — I252 Old myocardial infarction: Secondary | ICD-10-CM | POA: Diagnosis not present

## 2017-05-21 DIAGNOSIS — E039 Hypothyroidism, unspecified: Secondary | ICD-10-CM | POA: Diagnosis not present

## 2017-05-21 DIAGNOSIS — I493 Ventricular premature depolarization: Secondary | ICD-10-CM | POA: Insufficient documentation

## 2017-05-21 DIAGNOSIS — I5022 Chronic systolic (congestive) heart failure: Secondary | ICD-10-CM | POA: Insufficient documentation

## 2017-05-21 DIAGNOSIS — Z8719 Personal history of other diseases of the digestive system: Secondary | ICD-10-CM | POA: Diagnosis not present

## 2017-05-21 DIAGNOSIS — I110 Hypertensive heart disease with heart failure: Secondary | ICD-10-CM | POA: Insufficient documentation

## 2017-05-21 DIAGNOSIS — I272 Pulmonary hypertension, unspecified: Secondary | ICD-10-CM | POA: Insufficient documentation

## 2017-05-21 DIAGNOSIS — Z6841 Body Mass Index (BMI) 40.0 and over, adult: Secondary | ICD-10-CM | POA: Diagnosis not present

## 2017-05-21 DIAGNOSIS — Z7982 Long term (current) use of aspirin: Secondary | ICD-10-CM | POA: Insufficient documentation

## 2017-05-21 DIAGNOSIS — Z79899 Other long term (current) drug therapy: Secondary | ICD-10-CM | POA: Diagnosis not present

## 2017-05-21 DIAGNOSIS — E785 Hyperlipidemia, unspecified: Secondary | ICD-10-CM | POA: Diagnosis not present

## 2017-05-21 DIAGNOSIS — E6609 Other obesity due to excess calories: Secondary | ICD-10-CM | POA: Diagnosis not present

## 2017-05-21 LAB — BASIC METABOLIC PANEL
Anion gap: 6 (ref 5–15)
BUN: 11 mg/dL (ref 6–20)
CO2: 25 mmol/L (ref 22–32)
Calcium: 9.3 mg/dL (ref 8.9–10.3)
Chloride: 106 mmol/L (ref 101–111)
Creatinine, Ser: 1.04 mg/dL — ABNORMAL HIGH (ref 0.44–1.00)
GFR calc Af Amer: >60 mL/min (ref >60)
GFR calc non Af Amer: >60 mL/min (ref >60)
Glucose, Bld: 84 mg/dL (ref 65–99)
Potassium: 3.5 mmol/L (ref 3.5–5.1)
Sodium: 137 mmol/L (ref 135–145)

## 2017-05-21 LAB — DIGOXIN LEVEL: Digoxin Level: 0.4 ng/mL — ABNORMAL LOW (ref 0.8–2.0)

## 2017-05-21 LAB — LIPID PANEL
Cholesterol: 222 mg/dL — ABNORMAL HIGH (ref 0–200)
HDL: 52 mg/dL (ref >40)
LDL Cholesterol: 149 mg/dL — ABNORMAL HIGH (ref 0–99)
Total CHOL/HDL Ratio: 4.3 RATIO
Triglycerides: 105 mg/dL (ref <150)
VLDL: 21 mg/dL (ref 0–40)

## 2017-05-21 NOTE — Progress Notes (Signed)
Patient ID: [name], female   DOB: 1970-08-07, 46 y.o.   MRN: [id number]  PCP: [facility] in [place] Cardiology: Dr. [name]  HPI: [name] is a 46 y.o. 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  She was followed for systolic CHF initially in [place]. Apparently her EF recovered to 35-40%. However, she was admitted to [facility] 09/19/13 for worsening dyspnea and CP. Coronaries were reportedly OK on LHC at that time at [facility]. Echo showed EF of 15-20% with four-chamber enlargement with moderate-severe TR/MR. RHC as below. Rheumatological w/u negative.  CPX in 9/16 showed near-normal functional capacity.  Repeat echo in 6/17 shows that EF remains 20%. Repeat CPX in 1/18 was submaximal but probably only mild circulatory limitation.  Echo 8/18 showed EF 25-30%, severe LV dilation.    She returns today for followup of heart failure.  Symptomatically, she is doing well.  No dyspnea with her normal activities.  No chest pain.  No orthopnea/PND. However, weight is up 9 lbs.  She thinks this is dietary.  She has been snoring more with the weight gain. She will occasionally go on walks but no formal exercise program.    Medtronic device interrogation: Impedance stable, fluid index < threshold.  No VT.   RHC Procedural Findings: RA mean 5 RV 55/8 PA 56/31, mean 40 PCWP mean 24 Oxygen saturations: PA 61% AO 99% Cardiac Output (Fick) 4.3  Cardiac Index (Fick) 2.0 PVR 3.7 WU  RHC 09/24/13  RA 14  RV 57/22  PA 64/36 (48)  PCWP 18  Fick CO/CI: 3.7/1.6  PVR 8.1 WU  Ao sat 95%  PA sat 47%   CPX 06/27/2016  Peak VO2: 13.5 (56.2% predicted peak VO2) VE/VCO2 slope: 33 OUES: 2.16 Peak RER: 0.98  CPX  1/18 Peak VO2 13.5 VE/VCO2 slope 33 RER 0.98 Submaximal study, mild circulatory limitation, suspect more limited by body habitus.   Echo (8/15) with EF 20%, moderate LV dilation,  diffuse hypokinesis, normal RV size with mildly decreased systolic function, mild MR.  Echo (5/16) with EF 20%, spherical LV with diffuse hypokinesis, mild MR.  Echo (6/17) with EF 20%, diffuse hypokinesis, moderate LV dilation, normal RV, mild to moderate MR.  Echo (8/18) with EF 25-30%, severe LV dilation, severe LAE.   Sleep study did not show significant OSA.   RHC (8/15): Mean RA 3 PA 33/11 mean 19 Mean PCWP 8 CI 2.35  Holter (8/15) with < 1% PVCs  CPX 9/16 Peak VO2 16.2 VE/VCO2 slope 23.6 RER 1.25 Normal functional capacity  Labs: (11/09/13): Dig level 0.5, K 3.6, Creatinine 0.83, pro-BNP 622 (12/19/13): K 3.6, creatinine 0.8 (7/15): K 4.1, creatinine 0.91 (8/15): K 3.9, creatinine 0.91 (10/15): K 4, creatinine 0.93, proBNP 129 (12/15): K 4, creatinine 0.9, digoxin 0.4 (5/16): K 4, creatinine 0.86 (9/16): K 3.6, creatinine 0.86, digoxin < 0.2 (5/17): K 4.1, creatinine 0.95, digoxin 0.3 (5/18): K 3.4, creatinine 0.95, TSH elevated but free T3 and free T4 normal.  (8/18): K 3.9, creatinine 0.87, BNP 108   SH: Disabled since 2014. Lives her husband and 2 children. Does not drink alcohol or smoke   FH: Grandmother had heart failure   ROS: All systems negative except as listed in HPI, PMH and Problem List.  Past Medical History:  Diagnosis Date  . Automatic implantable cardioverter-defibrillator in situ   . Chronic CHF (congestive heart failure) ([place])  a. EF 15-20% b. RHC (09/2013) RA 14, RV 57/22, PA 64/36 (48), PCWP 18, FIck CO/CI 3.7/1.6, PVR 8.1 WU, PA sat 47%   . History of stomach ulcers   . Hypertension   . Hypothyroidism   . Morbid obesity ([place])   . Myocardial infarction ([place]) 08/2013  . Nocturnal dyspnea   . Nonischemic cardiomyopathy ([place])   . Sinus tachycardia   . Snoring-prob OSA 09/04/2011  . Sprint Fidelis ICD lead RECALL  [phone]     Current Outpatient Medications  Medication Sig Dispense Refill  . acetaminophen (TYLENOL) 500 MG tablet Take 1,000  mg by mouth daily as needed for moderate pain or headache.    Marland Kitchen aspirin 81 MG tablet Take 81 mg by mouth daily.    Marland Kitchen atorvastatin (LIPITOR) 20 MG tablet Take 1 tablet (20 mg total) by mouth daily. 60 tablet 6  . carvedilol (COREG) 12.5 MG tablet Take 1.5 tablets (18.75 mg total) by mouth 2 (two) times daily with a meal. 90 tablet 6  . digoxin (LANOXIN) 0.125 MG tablet TAKE 1 TABLET BY MOUTH ONCE DAILY 30 tablet 6  . ENTRESTO 24-26 MG TAKE 1 TABLET BY MOUTH TWICE DAILY 60 tablet 6  . furosemide (LASIX) 40 MG tablet TAKE 2 TABLETS BY MOUTH (80 MG) IN THE MORNING AND 1 TAB (40 MG) IN THE EVENING, AS INSTRUCTED. 90 tablet 3  . ivabradine (CORLANOR) 7.5 MG TABS tablet Take 1 tablet (7.5 mg total) 2 (two) times daily with a meal by mouth. 60 tablet 6  . levothyroxine (SYNTHROID, LEVOTHROID) 88 MCG tablet Take 88 mcg by mouth daily before breakfast.    . potassium chloride SA (K-DUR,KLOR-CON) 20 MEQ tablet TAKE 1 TABLET BY MOUTH ONCE DAILY 60 tablet 6  . spironolactone (ALDACTONE) 25 MG tablet TAKE 1 TABLET BY MOUTH ONCE DAILY 30 tablet 6   No current facility-administered medications for this encounter.    Vitals:   05/21/17 0958  BP: (!) 99/50  Pulse: 76  SpO2: 100%  Weight: 276 lb 8 oz (125.4 kg)    Wt Readings from Last 3 Encounters:  05/21/17 276 lb 8 oz (125.4 kg)  02/10/17 267 lb 8 oz (121.3 kg)  11/10/16 259 lb 3.2 oz (117.6 kg)     PHYSICAL EXAM: General: NAD, obese.  Neck: No JVD, no thyromegaly or thyroid nodule.  Lungs: Clear to auscultation bilaterally with normal respiratory effort. CV: Nondisplaced PMI.  Heart regular S1/S2, no S3/S4, no murmur.  No peripheral edema.  No carotid bruit.  Normal pedal pulses.  Abdomen: Soft, nontender, no hepatosplenomegaly, no distention.  Skin: Intact without lesions or rashes.  Neurologic: Alert and oriented x 3.  Psych: Normal affect. Extremities: No clubbing or cyanosis.  HEENT: Normal.   ASSESSMENT & PLAN: 1. Chronic systolic HF:  Nonischemic cardiomyopathy.  She has a Medtronic ICD.  Cause for cardiomyopathy is uncertain: initially noted peri-partum (after son's birth), so cannot rule out peri-partum CMP.  She was noted at one point to have frequent PVCs but holter showed <1% PVCs.   RHC in 8/15 showed normal filling pressures and preserved cardiac output.  She does not qualify for CRT with narrow QRS.  CPX 06/2016 with submaximal effort, limitation mostly from obesity with mild circulatory limitation.  Last echo in 8/18 showed EF 25-30% with severe LV dilation. She is not volume overloaded by exam or Optivol, NYHA class II. - Continue Lasix 40 qam/20 qpm.  BMET today. - Continue Entresto 24/26mg  BID.  No BP  room to increase.  - continue corlanor to 7.5 mg twice a day.  - Continue current spironolactone and digoxin, check digoxin level.  - Continue Coreg 18.75 mg bid.  No BP room to increase.  2. Pulmonary HTN: Mixed picture with PVR 8.1 on RHC at [facility].  However, repeat study in 8/15 here showed no significant pulmonary hypertension.  3. Morbid obesity: Needs to lose significant amount of weight . Body mass index is 47.46 kg/m.  - She is ready to get back to dieting/exercise. Plans to join the [facility].  - I will refer her to the  weight management program with Dr. Leafy Ro.  4. Hyperlipidemia: Check lipids today.   Followup in 3 months.  [name] 05/21/2017

## 2017-05-21 NOTE — Patient Instructions (Addendum)
Labs drawn today (if we do not call you, then your lab work was stable)   You have been referred to Dr. Trixie Rude for weight management call [phone] to enrolled   Your physician recommends that you schedule a follow-up appointment in: 3 months with Dr. [name]

## 2017-05-22 ENCOUNTER — Telehealth (HOSPITAL_COMMUNITY): Payer: Self-pay

## 2017-05-22 ENCOUNTER — Other Ambulatory Visit (HOSPITAL_COMMUNITY): Payer: Self-pay

## 2017-05-22 DIAGNOSIS — E785 Hyperlipidemia, unspecified: Secondary | ICD-10-CM

## 2017-05-22 MED ORDER — ATORVASTATIN CALCIUM 40 MG PO TABS: 40.0000 mg | ORAL_TABLET | Freq: Every day | ORAL | 3 refills | Status: DC

## 2017-05-22 NOTE — Telephone Encounter (Signed)
Notes recorded by Shirley Muscat, RN on 05/22/2017 at 3:44 PM EST Pt aware of results and agreeable to med changes. Appt scheduled on 07/21/17 at 9 am   ------  Notes recorded by Larey Dresser, MD on 05/21/2017 at 9:08 PM EST Level ok ------  Notes recorded by Larey Dresser, MD on 05/21/2017 at 11:53 AM EST Is she taking her atorvastatin? LDL is high. Increase atorvastatin to 40 mg daily with lipids/LFTs in 2 months if she is taking it.

## 2017-06-05 ENCOUNTER — Other Ambulatory Visit (HOSPITAL_COMMUNITY): Payer: Self-pay | Admitting: Internal Medicine

## 2017-06-29 ENCOUNTER — Other Ambulatory Visit: Payer: Self-pay | Admitting: Cardiology

## 2017-07-07 ENCOUNTER — Ambulatory Visit (INDEPENDENT_AMBULATORY_CARE_PROVIDER_SITE_OTHER): Payer: BLUE CROSS/BLUE SHIELD | Admitting: *Deleted

## 2017-07-07 DIAGNOSIS — I428 Other cardiomyopathies: Secondary | ICD-10-CM | POA: Diagnosis not present

## 2017-07-08 NOTE — Progress Notes (Signed)
Remote ICD transmission.   

## 2017-07-09 LAB — CUP PACEART REMOTE DEVICE CHECK
Battery Remaining Longevity: 109 mo
Battery Voltage: 3.01 V
Brady Statistic RV Percent Paced: 0.01 %
Date Time Interrogation Session: 20190115093624
HighPow Impedance: 83 Ohm
Implantable Lead Implant Date: 20150706
Implantable Lead Location: 753860
Implantable Pulse Generator Implant Date: 20150706
Lead Channel Impedance Value: 456 Ohm
Lead Channel Impedance Value: 608 Ohm
Lead Channel Pacing Threshold Amplitude: 0.375 V
Lead Channel Pacing Threshold Pulse Width: 0.4 ms
Lead Channel Sensing Intrinsic Amplitude: 10.625 mV
Lead Channel Sensing Intrinsic Amplitude: 10.625 mV
Lead Channel Setting Pacing Amplitude: 2 V
Lead Channel Setting Pacing Pulse Width: 0.4 ms
Lead Channel Setting Sensing Sensitivity: 0.3 mV

## 2017-07-10 ENCOUNTER — Encounter: Payer: Self-pay | Admitting: Cardiology

## 2017-07-21 ENCOUNTER — Ambulatory Visit (HOSPITAL_COMMUNITY)
Admission: RE | Admit: 2017-07-21 | Discharge: 2017-07-21 | Disposition: A | Discharge status: Home / Self Care | Payer: BLUE CROSS/BLUE SHIELD | Source: Ambulatory Visit | Attending: Cardiology | Admitting: Cardiology

## 2017-07-21 DIAGNOSIS — E785 Hyperlipidemia, unspecified: Secondary | ICD-10-CM | POA: Insufficient documentation

## 2017-07-21 LAB — LIPID PANEL
Cholesterol: 156 mg/dL (ref 0–200)
HDL: 47 mg/dL (ref >40)
LDL Cholesterol: 91 mg/dL (ref 0–99)
Total CHOL/HDL Ratio: 3.3 RATIO
Triglycerides: 89 mg/dL (ref <150)
VLDL: 18 mg/dL (ref 0–40)

## 2017-07-21 LAB — HEPATIC FUNCTION PANEL
ALT: 13 U/L — ABNORMAL LOW (ref 14–54)
AST: 17 U/L (ref 15–41)
Albumin: 3.5 g/dL (ref 3.5–5.0)
Alkaline Phosphatase: 73 U/L (ref 38–126)
Bilirubin, Direct: <0.1 mg/dL — ABNORMAL LOW (ref 0.1–0.5)
Total Bilirubin: 0.6 mg/dL (ref 0.3–1.2)
Total Protein: 7.5 g/dL (ref 6.5–8.1)

## 2017-08-25 ENCOUNTER — Encounter (HOSPITAL_COMMUNITY): Payer: Self-pay | Admitting: Cardiology

## 2017-08-25 ENCOUNTER — Ambulatory Visit (HOSPITAL_COMMUNITY)
Admission: RE | Admit: 2017-08-25 | Discharge: 2017-08-25 | Disposition: A | Discharge status: Home / Self Care | Payer: BLUE CROSS/BLUE SHIELD | Source: Ambulatory Visit | Attending: Cardiology | Admitting: Cardiology

## 2017-08-25 VITALS — BP 104/67 | HR 64 | Wt 281.8 lb

## 2017-08-25 DIAGNOSIS — Z95810 Presence of automatic (implantable) cardiac defibrillator: Secondary | ICD-10-CM | POA: Insufficient documentation

## 2017-08-25 DIAGNOSIS — Z6841 Body Mass Index (BMI) 40.0 and over, adult: Secondary | ICD-10-CM | POA: Insufficient documentation

## 2017-08-25 DIAGNOSIS — Z7982 Long term (current) use of aspirin: Secondary | ICD-10-CM | POA: Diagnosis not present

## 2017-08-25 DIAGNOSIS — I110 Hypertensive heart disease with heart failure: Secondary | ICD-10-CM | POA: Diagnosis not present

## 2017-08-25 DIAGNOSIS — E039 Hypothyroidism, unspecified: Secondary | ICD-10-CM | POA: Insufficient documentation

## 2017-08-25 DIAGNOSIS — I5022 Chronic systolic (congestive) heart failure: Secondary | ICD-10-CM | POA: Diagnosis not present

## 2017-08-25 DIAGNOSIS — Z8249 Family history of ischemic heart disease and other diseases of the circulatory system: Secondary | ICD-10-CM | POA: Diagnosis not present

## 2017-08-25 DIAGNOSIS — E785 Hyperlipidemia, unspecified: Secondary | ICD-10-CM | POA: Insufficient documentation

## 2017-08-25 DIAGNOSIS — I428 Other cardiomyopathies: Secondary | ICD-10-CM | POA: Diagnosis not present

## 2017-08-25 DIAGNOSIS — Z79890 Hormone replacement therapy (postmenopausal): Secondary | ICD-10-CM | POA: Insufficient documentation

## 2017-08-25 DIAGNOSIS — Z79899 Other long term (current) drug therapy: Secondary | ICD-10-CM | POA: Insufficient documentation

## 2017-08-25 DIAGNOSIS — Z8719 Personal history of other diseases of the digestive system: Secondary | ICD-10-CM | POA: Insufficient documentation

## 2017-08-25 DIAGNOSIS — I252 Old myocardial infarction: Secondary | ICD-10-CM | POA: Diagnosis not present

## 2017-08-25 DIAGNOSIS — I272 Pulmonary hypertension, unspecified: Secondary | ICD-10-CM

## 2017-08-25 LAB — BASIC METABOLIC PANEL
Anion gap: 8 (ref 5–15)
BUN: 11 mg/dL (ref 6–20)
CO2: 26 mmol/L (ref 22–32)
Calcium: 9.6 mg/dL (ref 8.9–10.3)
Chloride: 105 mmol/L (ref 101–111)
Creatinine, Ser: 0.99 mg/dL (ref 0.44–1.00)
GFR calc Af Amer: >60 mL/min (ref >60)
GFR calc non Af Amer: >60 mL/min (ref >60)
Glucose, Bld: 93 mg/dL (ref 65–99)
Potassium: 4 mmol/L (ref 3.5–5.1)
Sodium: 139 mmol/L (ref 135–145)

## 2017-08-25 LAB — DIGOXIN LEVEL: Digoxin Level: 0.7 ng/mL — ABNORMAL LOW (ref 0.8–2.0)

## 2017-08-25 MED ORDER — CARVEDILOL 25 MG PO TABS: 25.0000 mg | ORAL_TABLET | Freq: Two times a day (BID) | ORAL | 6 refills | Status: DC

## 2017-08-25 MED ORDER — IVABRADINE HCL 5 MG PO TABS: 5.0000 mg | ORAL_TABLET | Freq: Two times a day (BID) | ORAL | 6 refills | Status: DC

## 2017-08-25 NOTE — Progress Notes (Signed)
Patient ID: [name], female   DOB: 17-Jul-1970, 47 y.o.   MRN: [id number]  PCP: [facility] in [place] Cardiology: Dr. [name]  HPI: [name] is a 47 y.o. 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  She was followed for systolic CHF initially in [place]. Apparently her EF recovered to 35-40%. However, she was admitted to [facility] 09/19/13 for worsening dyspnea and CP. Coronaries were reportedly OK on LHC at that time at [facility]. Echo showed EF of 15-20% with four-chamber enlargement with moderate-severe TR/MR. RHC as below. Rheumatological w/u negative.  CPX in 9/16 showed near-normal functional capacity.  Repeat echo in 6/17 shows that EF remains 20%. Repeat CPX in 1/18 was submaximal but probably only mild circulatory limitation.  Echo 8/18 showed EF 25-30%, severe LV dilation.    She returns today for followup of heart failure.  Her weight is up 5 lbs.  She has been exercising, walks at the park and uses a stationary bike.  No significant dyspnea walking on flat ground.  She has chronic orthopnea, sleeps propped up in an adjustable bed. No lightheadedness.  No chest pain. She reports occasional dietary indiscretion, ate [place] food over the weekend and weight went up.     Medtronic device interrogation: fluid index nearing threshold but recently impedance has been trending back up again. Marland Kitchen   RHC Procedural Findings: RA mean 5 RV 55/8 PA 56/31, mean 40 PCWP mean 24 Oxygen saturations: PA 61% AO 99% Cardiac Output (Fick) 4.3  Cardiac Index (Fick) 2.0 PVR 3.7 WU  RHC 09/24/13  RA 14  RV 57/22  PA 64/36 (48)  PCWP 18  Fick CO/CI: 3.7/1.6  PVR 8.1 WU  Ao sat 95%  PA sat 47%   CPX 06/27/2016  Peak VO2: 13.5 (56.2% predicted peak VO2) VE/VCO2 slope: 33 OUES: 2.16 Peak RER: 0.98  CPX  1/18 Peak VO2 13.5 VE/VCO2 slope 33 RER 0.98 Submaximal study, mild circulatory  limitation, suspect more limited by body habitus.   Echo (8/15) with EF 20%, moderate LV dilation, diffuse hypokinesis, normal RV size with mildly decreased systolic function, mild MR.  Echo (5/16) with EF 20%, spherical LV with diffuse hypokinesis, mild MR.  Echo (6/17) with EF 20%, diffuse hypokinesis, moderate LV dilation, normal RV, mild to moderate MR.  Echo (8/18) with EF 25-30%, severe LV dilation, severe LAE.   Sleep study did not show significant OSA.   RHC (8/15): Mean RA 3 PA 33/11 mean 19 Mean PCWP 8 CI 2.35  Holter (8/15) with < 1% PVCs  CPX 9/16 Peak VO2 16.2 VE/VCO2 slope 23.6 RER 1.25 Normal functional capacity  Labs: (11/09/13): Dig level 0.5, K 3.6, Creatinine 0.83, pro-BNP 622 (12/19/13): K 3.6, creatinine 0.8 (7/15): K 4.1, creatinine 0.91 (8/15): K 3.9, creatinine 0.91 (10/15): K 4, creatinine 0.93, proBNP 129 (12/15): K 4, creatinine 0.9, digoxin 0.4 (5/16): K 4, creatinine 0.86 (9/16): K 3.6, creatinine 0.86, digoxin < 0.2 (5/17): K 4.1, creatinine 0.95, digoxin 0.3 (5/18): K 3.4, creatinine 0.95, TSH elevated but free T3 and free T4 normal.  (8/18): K 3.9, creatinine 0.87, BNP 108  (11/18): K 3.5, creatinine 1.04, digoxin 0.4 (1/19): LDL 91, HDL 47  SH: Disabled since 2014. Lives her husband and 2 children. Does not drink alcohol or smoke   FH: Grandmother had heart failure   ROS: All systems negative except as listed in HPI, PMH and Problem  List.  Past Medical History:  Diagnosis Date  . Automatic implantable cardioverter-defibrillator in situ   . Chronic CHF (congestive heart failure) (HCC)    a. EF 15-20% b. RHC (09/2013) RA 14, RV 57/22, PA 64/36 (48), PCWP 18, FIck CO/CI 3.7/1.6, PVR 8.1 WU, PA sat 47%   . History of stomach ulcers   . Hypertension   . Hypothyroidism   . Morbid obesity ([place])   . Myocardial infarction ([place]) 08/2013  . Nocturnal dyspnea   . Nonischemic cardiomyopathy ([place])   . Sinus tachycardia   . Snoring-prob OSA  09/04/2011  . Sprint Fidelis ICD lead RECALL  [phone]     Current Outpatient Medications  Medication Sig Dispense Refill  . acetaminophen (TYLENOL) 500 MG tablet Take 1,000 mg by mouth daily as needed for moderate pain or headache.    Marland Kitchen aspirin 81 MG tablet Take 81 mg by mouth daily.    Marland Kitchen atorvastatin (LIPITOR) 40 MG tablet Take 1 tablet (40 mg total) by mouth daily. 30 tablet 3  . carvedilol (COREG) 25 MG tablet Take 1 tablet (25 mg total) by mouth 2 (two) times daily with a meal. 60 tablet 6  . digoxin (LANOXIN) 0.125 MG tablet TAKE 1 TABLET BY MOUTH ONCE DAILY 30 tablet 6  . ENTRESTO 24-26 MG TAKE 1 TABLET BY MOUTH TWICE DAILY 60 tablet 6  . furosemide (LASIX) 40 MG tablet TAKE 2 TABLETS BY MOUTH (80 MG) IN THE MORNING AND 1 TAB (40 MG) IN THE EVENING, AS INSTRUCTED. 90 tablet 3  . ivabradine (CORLANOR) 5 MG TABS tablet Take 1 tablet (5 mg total) by mouth 2 (two) times daily with a meal. 60 tablet 6  . levothyroxine (SYNTHROID, LEVOTHROID) 88 MCG tablet Take 88 mcg by mouth daily before breakfast.    . potassium chloride SA (K-DUR,KLOR-CON) 20 MEQ tablet TAKE 1 TABLET BY MOUTH ONCE DAILY 60 tablet 6  . spironolactone (ALDACTONE) 25 MG tablet TAKE 1 TABLET BY MOUTH ONCE DAILY 30 tablet 6   No current facility-administered medications for this encounter.    Vitals:   08/25/17 1054  BP: 104/67  Pulse: 64  SpO2: 100%  Weight: 281 lb 12.8 oz (127.8 kg)    Wt Readings from Last 3 Encounters:  08/25/17 281 lb 12.8 oz (127.8 kg)  05/21/17 276 lb 8 oz (125.4 kg)  02/10/17 267 lb 8 oz (121.3 kg)     PHYSICAL EXAM: General: NAD, obese Neck: No JVD, no thyromegaly or thyroid nodule.  Lungs: Clear to auscultation bilaterally with normal respiratory effort. CV: Nondisplaced PMI.  Heart regular S1/S2, no S3/S4, no murmur.  No peripheral edema.  No carotid bruit.  Normal pedal pulses.  Abdomen: Soft, nontender, no hepatosplenomegaly, no distention.  Skin: Intact without lesions or rashes.    Neurologic: Alert and oriented x 3.  Psych: Normal affect. Extremities: No clubbing or cyanosis.  HEENT: Normal.   ASSESSMENT & PLAN: 1. Chronic systolic HF: Nonischemic cardiomyopathy.  She has a Medtronic ICD.  Cause for cardiomyopathy is uncertain: initially noted peri-partum (after son's birth), so cannot rule out peri-partum CMP.  She was noted at one point to have frequent PVCs but holter showed <1% PVCs.   RHC in 8/15 showed normal filling pressures and preserved cardiac output.  She does not qualify for CRT with narrow QRS.  CPX 06/2016 with submaximal effort, limitation mostly from obesity with mild circulatory limitation.  Last echo in 8/18 showed EF 25-30% with severe LV dilation. She is not  volume overloaded by exam.  Optivol suggested increased fluid recently, likely corresponding to meal at [employer].  However, thoracic impedance has been rising back to normal in the last couple days.  - Continue Lasix 80 qam/40 qpm.  BMET today. - Continue Entresto 24/26mg  BID.    - Increase Coreg to 25 mg bid.  With HR in 60s, I will decrease ivabradine down to 5 mg bid.   - Continue current spironolactone and digoxin, check digoxin level.  2. Pulmonary HTN: Mixed picture with PVR 8.1 on RHC at [facility]  However, repeat study in 8/15 here showed no significant pulmonary hypertension.  3. Morbid obesity: Needs to lose significant amount of weight . Body mass index is 48.37 kg/m.  She is having trouble losing weight despite increased exercise. - I will refer her to the [place] weight management program with Dr. Leafy Ro.  4. Hyperlipidemia: Good lipids in 1/19.    Followup in 3 months.  [name] 08/25/2017

## 2017-08-25 NOTE — Patient Instructions (Signed)
Decrease Corlanor to 5 mg Twice daily   Increase Carvedilol to 25 mg Twice daily   Labs done today  You have been referred to Dr Dennard Nip for [facility], their office will call you, THEY ARE BACK UP TILL THE END OF April  Your physician recommends that you schedule a follow-up appointment in: 3 months

## 2017-09-24 ENCOUNTER — Encounter (INDEPENDENT_AMBULATORY_CARE_PROVIDER_SITE_OTHER): Payer: Self-pay

## 2017-10-05 ENCOUNTER — Ambulatory Visit (INDEPENDENT_AMBULATORY_CARE_PROVIDER_SITE_OTHER): Payer: BLUE CROSS/BLUE SHIELD | Admitting: Family Medicine

## 2017-10-05 ENCOUNTER — Encounter (INDEPENDENT_AMBULATORY_CARE_PROVIDER_SITE_OTHER): Payer: Self-pay | Admitting: Family Medicine

## 2017-10-05 VITALS — BP 104/71 | HR 74 | Temp 97.9°F | Ht 63.0 in | Wt 277.0 lb

## 2017-10-05 DIAGNOSIS — R5383 Other fatigue: Secondary | ICD-10-CM

## 2017-10-05 DIAGNOSIS — E038 Other specified hypothyroidism: Secondary | ICD-10-CM | POA: Diagnosis not present

## 2017-10-05 DIAGNOSIS — Z9189 Other specified personal risk factors, not elsewhere classified: Secondary | ICD-10-CM | POA: Diagnosis not present

## 2017-10-05 DIAGNOSIS — Z0289 Encounter for other administrative examinations: Secondary | ICD-10-CM

## 2017-10-05 DIAGNOSIS — R739 Hyperglycemia, unspecified: Secondary | ICD-10-CM | POA: Diagnosis not present

## 2017-10-05 DIAGNOSIS — Z6841 Body Mass Index (BMI) 40.0 and over, adult: Secondary | ICD-10-CM

## 2017-10-05 DIAGNOSIS — Z1331 Encounter for screening for depression: Secondary | ICD-10-CM | POA: Diagnosis not present

## 2017-10-05 DIAGNOSIS — R0602 Shortness of breath: Secondary | ICD-10-CM | POA: Diagnosis not present

## 2017-10-06 ENCOUNTER — Ambulatory Visit (INDEPENDENT_AMBULATORY_CARE_PROVIDER_SITE_OTHER): Payer: BLUE CROSS/BLUE SHIELD | Admitting: *Deleted

## 2017-10-06 ENCOUNTER — Telehealth: Payer: Self-pay | Admitting: Cardiology

## 2017-10-06 DIAGNOSIS — I428 Other cardiomyopathies: Secondary | ICD-10-CM

## 2017-10-06 LAB — CBC WITH DIFFERENTIAL
Basophils Absolute: 0 10*3/uL (ref 0.0–0.2)
Basos: 0 %
EOS (ABSOLUTE): 0.2 10*3/uL (ref 0.0–0.4)
Eos: 2 %
Hematocrit: 41.7 % (ref 34.0–46.6)
Hemoglobin: 13.6 g/dL (ref 11.1–15.9)
Immature Grans (Abs): 0 10*3/uL (ref 0.0–0.1)
Immature Granulocytes: 0 %
Lymphocytes Absolute: 1.9 10*3/uL (ref 0.7–3.1)
Lymphs: 21 %
MCH: 28.1 pg (ref 26.6–33.0)
MCHC: 32.6 g/dL (ref 31.5–35.7)
MCV: 86 fL (ref 79–97)
Monocytes Absolute: 0.8 10*3/uL (ref 0.1–0.9)
Monocytes: 9 %
Neutrophils Absolute: 6 10*3/uL (ref 1.4–7.0)
Neutrophils: 68 %
RBC: 4.84 x10E6/uL (ref 3.77–5.28)
RDW: 14.5 % (ref 12.3–15.4)
WBC: 8.9 10*3/uL (ref 3.4–10.8)

## 2017-10-06 LAB — LIPID PANEL WITH LDL/HDL RATIO
Cholesterol, Total: 171 mg/dL (ref 100–199)
HDL: 57 mg/dL (ref >39)
LDL Calculated: 95 mg/dL (ref 0–99)
LDl/HDL Ratio: 1.7 ratio (ref 0.0–3.2)
Triglycerides: 97 mg/dL (ref 0–149)
VLDL Cholesterol Cal: 19 mg/dL (ref 5–40)

## 2017-10-06 LAB — TSH: TSH: 3.68 u[IU]/mL (ref 0.450–4.500)

## 2017-10-06 LAB — FOLATE: Folate: 7.8 ng/mL (ref >3.0)

## 2017-10-06 LAB — T3: T3, Total: 115 ng/dL (ref 71–180)

## 2017-10-06 LAB — T4, FREE: Free T4: 1.14 ng/dL (ref 0.82–1.77)

## 2017-10-06 LAB — VITAMIN B12: Vitamin B-12: 440 pg/mL (ref 232–1245)

## 2017-10-06 LAB — VITAMIN D 25 HYDROXY (VIT D DEFICIENCY, FRACTURES): Vit D, 25-Hydroxy: 6.2 ng/mL — ABNORMAL LOW (ref 30.0–100.0)

## 2017-10-06 LAB — HEMOGLOBIN A1C
Est. average glucose Bld gHb Est-mCnc: 114 mg/dL
Hgb A1c MFr Bld: 5.6 % (ref 4.8–5.6)

## 2017-10-06 LAB — INSULIN, RANDOM: INSULIN: 22.5 u[IU]/mL (ref 2.6–24.9)

## 2017-10-06 NOTE — Progress Notes (Signed)
Office: [phone]  /  Fax: [phone]   Dear Dr. Aundra Clark,   Thank you for referring [name] to our clinic. The following note includes my evaluation and treatment recommendations.  HPI:   Chief Complaint: OBESITY    [name] has been referred by Miranda Dresser, MD for consultation regarding her obesity and obesity related comorbidities.    [name] (MR# [id number]) is a 47 y.o. female who presents on 10/06/2017 for obesity evaluation and treatment. Current BMI is Body mass index is 49.07 kg/m.Marland Kitchen [name] has been struggling with her weight for many years and has been unsuccessful in either losing weight, maintaining weight loss, or reaching her healthy weight goal.     [name] attended our information session and states she is currently in the action stage of change and ready to dedicate time achieving and maintaining a healthier weight. [name] is interested in becoming our patient and working on intensive lifestyle modifications including (but not limited to) diet, exercise and weight loss.    [name] states her family eats meals together she thinks her family will eat healthier with  her her desired weight is 180 lbs she has been heavy most of  her life her heaviest weight ever was 290 lbs. she has significant food cravings issues  she skips meals frequently she is frequently drinking liquids with calories she frequently makes poor food choices she struggles with emotional eating   Dr. Aundra Clark prescribed fluid restriction 2 liters per day   Fatigue [name] feels her energy is lower than it should be. This has worsened with weight gain and has not worsened recently. [name] admits to daytime somnolence and  denies waking up still tired. Patient is at risk for obstructive sleep apnea. Patent has a history of symptoms of daytime fatigue. Patient generally gets 8 to 10 hours of sleep per night, and states they generally have restful sleep. Snoring is present. Apneic episodes are  present. Epworth Sleepiness Score is 3  EKG was ordered today and shows PVC, LVH and old infarction.   Dyspnea on exertion [name] notes increasing shortness of breath with exercising and seems to be worsening over time with weight gain. She notes getting out of breath sooner with activity than she used to. This has not gotten worse recently. EKG was ordered today and shows PVC, LVH and old infarction. [name] denies orthopnea.  Hyperglycemia [name] has a diagnosis of hyperglycemia based on her elevated blood sugar previously and was informed this puts her at greater risk of developing diabetes.   At risk for diabetes [name] is at higher than average risk for developing diabetes due to her obesity and hyperglycemia. She currently denies polyuria or polydipsia.  Hypothyroid [name] has a diagnosis of hypothyroidism. Last thyroid panel was in May 2018. She is not on levothyroxine. She denies hot or cold intolerance or palpitations, but does admit to ongoing fatigue.  Depression Screen [name]'s Food and Mood (modified PHQ-9) score was  Depression screen PHQ 2/9 10/05/2017  Decreased Interest 1  Down, Depressed, Hopeless 0  PHQ - 2 Score 1  Altered sleeping 1  Tired, decreased energy 2  Feeling bad or failure about yourself  1  Trouble concentrating 0  Moving slowly or fidgety/restless 0  Suicidal thoughts 0  PHQ-9 Score 5  Difficult doing work/chores Somewhat difficult    ALLERGIES: No Known Allergies  MEDICATIONS: Current Outpatient Medications on File Prior to Visit  Medication Sig Dispense Refill  . atorvastatin (LIPITOR) 40 MG  tablet Take 1 tablet (40 mg total) by mouth daily. 30 tablet 3  . carvedilol (COREG) 25 MG tablet Take 1 tablet (25 mg total) by mouth 2 (two) times daily with a meal. 60 tablet 6  . digoxin (LANOXIN) 0.125 MG tablet TAKE 1 TABLET BY MOUTH ONCE DAILY 30 tablet 6  . ENTRESTO 24-26 MG TAKE 1 TABLET BY MOUTH TWICE DAILY 60 tablet 6  . furosemide (LASIX) 40 MG  tablet TAKE 2 TABLETS BY MOUTH (80 MG) IN THE MORNING AND 1 TAB (40 MG) IN THE EVENING, AS INSTRUCTED. 90 tablet 3  . ivabradine (CORLANOR) 5 MG TABS tablet Take 1 tablet (5 mg total) by mouth 2 (two) times daily with a meal. 60 tablet 6  . potassium chloride SA (K-DUR,KLOR-CON) 20 MEQ tablet TAKE 1 TABLET BY MOUTH ONCE DAILY 60 tablet 6  . spironolactone (ALDACTONE) 25 MG tablet TAKE 1 TABLET BY MOUTH ONCE DAILY 30 tablet 6   No current facility-administered medications on file prior to visit.     PAST MEDICAL HISTORY: Past Medical History:  Diagnosis Date  . Automatic implantable cardioverter-defibrillator in situ   . Chronic CHF (congestive heart failure) (HCC)    a. EF 15-20% b. RHC (09/2013) RA 14, RV 57/22, PA 64/36 (48), PCWP 18, FIck CO/CI 3.7/1.6, PVR 8.1 WU, PA sat 47%   . History of stomach ulcers   . Hypertension   . Hypotension   . Hypothyroidism   . Morbid obesity ([place])   . Myocardial infarction ([place]) 08/2013  . Nocturnal dyspnea   . Nonischemic cardiomyopathy ([place])   . Sinus tachycardia   . Snoring-prob OSA 09/04/2011  . Sprint Fidelis ICD lead RECALL  [id number]     PAST SURGICAL HISTORY: Past Surgical History:  Procedure Laterality Date  . BREATH TEK H PYLORI N/A 11/09/2014   Procedure: BREATH TEK H PYLORI;  Surgeon: Miranda Pickerel, MD;  Location: [facility];  Service: General;  Laterality: N/A;  . CARDIAC CATHETERIZATION  ~ 2006; 09/2013  . CARDIAC CATHETERIZATION N/A 05/23/2016   Procedure: Right Heart Cath;  Surgeon: Miranda Dresser, MD;  Location: [facility];  Service: Cardiovascular;  Laterality: N/A;  . CARDIAC DEFIBRILLATOR PLACEMENT  2006; 12/26/2013   Medtronic [id number]; 12-2013 ICD gen change and RV lead revision with new 3086 RV lead by Dr [name]  . CESAREAN SECTION  1999  . IMPLANTABLE CARDIOVERTER DEFIBRILLATOR GENERATOR CHANGE N/A 12/26/2013   Procedure: IMPLANTABLE CARDIOVERTER DEFIBRILLATOR GENERATOR CHANGE;  Surgeon: Miranda Sprang, MD;  Location:  [facility] CATH LAB;  Service: Cardiovascular;  Laterality: N/A;  . LEAD REVISION N/A 12/26/2013   Procedure: LEAD REVISION;  Surgeon: Miranda Sprang, MD;  Location: [facility] CATH LAB;  Service: Cardiovascular;  Laterality: N/A;  . RIGHT HEART CATHETERIZATION N/A 02/15/2014   Procedure: RIGHT HEART CATH;  Surgeon: Miranda Dresser, MD;  Location: [facility] CATH LAB;  Service: Cardiovascular;  Laterality: N/A;  . TUBAL LIGATION  1999    SOCIAL HISTORY: Social History   Tobacco Use  . Smoking status: Never Smoker  . Smokeless tobacco: Never Used  Substance Use Topics  . Alcohol use: No  . Drug use: No    FAMILY HISTORY: Family History  Problem Relation Age of Onset  . Heart disease Maternal Grandmother   . Heart disease Mother   . Obesity Mother   . High blood pressure Father   . Stroke Father     ROS: Review of Systems  Constitutional: Positive for malaise/fatigue.  Respiratory: Positive for shortness of breath (with activity).   Cardiovascular: Negative for palpitations and orthopnea.       Very Cold Feet or Hands  Gastrointestinal: Positive for constipation.  Genitourinary: Negative for frequency.  Endo/Heme/Allergies: Negative for polydipsia.       Negative for heat or cold intolerance    PHYSICAL EXAM: Blood pressure 104/71, pulse 74, temperature 97.9 F (36.6 C), temperature source Oral, height 5\' 3"  (1.6 m), weight 277 lb (125.6 kg), SpO2 98 %. Body mass index is 49.07 kg/m. Physical Exam  Constitutional: She is oriented to person, place, and time. She appears well-developed and well-nourished.  HENT:  Head: Normocephalic and atraumatic.  Nose: Nose normal.  Eyes: EOM are normal. No scleral icterus.  Neck: Normal range of motion. Neck supple. No thyromegaly present.  Cardiovascular: Normal rate and regular rhythm.  Pulmonary/Chest: Effort normal. No respiratory distress.  Abdominal: Soft. There is no tenderness.  + obesity  Musculoskeletal: Normal range of motion.  Range of  Motion normal in all 4 extremities  Neurological: She is alert and oriented to person, place, and time. Coordination normal.  Skin: Skin is warm and dry.  Psychiatric: She has a normal mood and affect. Her behavior is normal.  Vitals reviewed.   RECENT LABS AND TESTS: BMET    Component Value Date/Time   NA 139 08/25/2017 1114   NA 138 09/24/2013 0536   K 4.0 08/25/2017 1114   K 3.7 09/24/2013 0536   CL 105 08/25/2017 1114   CL 107 09/24/2013 0536   CO2 26 08/25/2017 1114   CO2 25 09/24/2013 0536   GLUCOSE 93 08/25/2017 1114   GLUCOSE 82 09/24/2013 0536   BUN 11 08/25/2017 1114   BUN 11 09/24/2013 0536   CREATININE 0.99 08/25/2017 1114   CREATININE 1.08 09/24/2013 0536   CALCIUM 9.6 08/25/2017 1114   CALCIUM 8.6 09/24/2013 0536   GFRNONAA >60 08/25/2017 1114   GFRNONAA >60 09/24/2013 0536   GFRAA >60 08/25/2017 1114   GFRAA >60 09/24/2013 0536   Lab Results  Component Value Date   HGBA1C 5.9 09/19/2013   No results found for: INSULIN CBC    Component Value Date/Time   WBC 10.6 (H) 05/20/2016 1515   RBC 4.64 05/20/2016 1515   HGB 13.1 05/20/2016 1515   HGB 11.9 (L) 09/23/2013 0443   HCT 39.9 05/20/2016 1515   HCT 36.8 09/23/2013 0443   PLT 269 05/20/2016 1515   PLT 195 09/23/2013 0443   MCV 86.0 05/20/2016 1515   MCV 85 09/23/2013 0443   MCH 28.2 05/20/2016 1515   MCHC 32.8 05/20/2016 1515   RDW 13.8 05/20/2016 1515   RDW 15.4 (H) 09/23/2013 0443   LYMPHSABS 1.9 12/19/2013 1158   LYMPHSABS 2.0 09/23/2013 0443   MONOABS 0.5 12/19/2013 1158   MONOABS 0.4 09/23/2013 0443   EOSABS 0.2 12/19/2013 1158   EOSABS 0.1 09/23/2013 0443   BASOSABS 0.0 12/19/2013 1158   BASOSABS 0.1 09/23/2013 0443   Iron/TIBC/Ferritin/ %Sat No results found for: IRON, TIBC, FERRITIN, IRONPCTSAT Lipid Panel     Component Value Date/Time   CHOL 156 07/21/2017 0854   CHOL 183 09/20/2013 0424   TRIG 89 07/21/2017 0854   TRIG 105 09/20/2013 0424   HDL 47 07/21/2017 0854   HDL 40  09/20/2013 0424   CHOLHDL 3.3 07/21/2017 0854   VLDL 18 07/21/2017 0854   VLDL 21 09/20/2013 0424   LDLCALC 91 07/21/2017 0854   LDLCALC 122 (H) 09/20/2013 0424  Hepatic Function Panel     Component Value Date/Time   PROT 7.5 07/21/2017 0854   PROT 7.7 09/19/2013 0431   ALBUMIN 3.5 07/21/2017 0854   ALBUMIN 3.5 09/19/2013 0431   AST 17 07/21/2017 0854   AST 36 09/19/2013 0431   ALT 13 (L) 07/21/2017 0854   ALT 32 09/19/2013 0431   ALKPHOS 73 07/21/2017 0854   ALKPHOS 65 09/19/2013 0431   BILITOT 0.6 07/21/2017 0854   BILITOT 0.4 09/19/2013 0431   BILIDIR <0.1 (L) 07/21/2017 0854   IBILI NOT CALCULATED 07/21/2017 0854      Component Value Date/Time   TSH 5.995 (H) 11/14/2016 1354   TSH 7.068 (H) 11/10/2016 1040   TSH 2.90 02/29/2008 1132    ECG  shows NSR with a rate of 73 BPM INDIRECT CALORIMETER done today shows a VO2 of 274 and a REE of 1909.  Her calculated basal metabolic rate is 3151 thus her basal metabolic rate is worse than expected.    ASSESSMENT AND PLAN: Other fatigue - Plan: EKG 12-Lead, CBC With Differential, Hemoglobin A1c, Insulin, random, Lipid Panel With LDL/HDL Ratio, VITAMIN D 25 Hydroxy (Vit-D Deficiency, Fractures), Vitamin B12, Folate  Shortness of breath on exertion  Other specified hypothyroidism - Plan: T3, T4, free, TSH  Hyperglycemia - Plan: Hemoglobin A1c, Insulin, random  Depression screening  At risk for diabetes mellitus  Class 3 severe obesity with serious comorbidity and body mass index (BMI) of 45.0 to 49.9 in adult, unspecified obesity type ([place])  PLAN: Fatigue Terrell was informed that her fatigue may be related to obesity, depression or many other causes. Labs will be ordered, and in the meanwhile Erinn has agreed to work on diet, exercise and weight loss to help with fatigue. Proper sleep hygiene was discussed including the need for 7-8 hours of quality sleep each night. A sleep study was not ordered based on symptoms and  Epworth score. We will order indirect calorimetry.  Dyspnea on exertion Shandria's shortness of breath appears to be obesity related and exercise induced. She has agreed to work on weight loss and gradually increase exercise to treat her exercise induced shortness of breath. If Lorella follows our instructions and loses weight without improvement of her shortness of breath, we will plan to refer to pulmonology. We will order labs and indirect calorimetry.We will monitor this condition regularly. Mikela agrees to this plan.  Hyperglycemia Elly will start to work on weight loss, exercise, and decreasing simple carbohydrates in her diet to help decrease the risk of diabetes. She was informed that eating too many simple carbohydrates or too many calories at one sitting increases the likelihood of GI side effects. We will check Hgb A1c and fasting insulin today. Casey agreed to follow up with [name] as directed to monitor her progress.  Diabetes risk counseling Annaleia was given extended (15 minutes) diabetes prevention counseling today. She is 47 y.o. female and has risk factors for diabetes including obesity and hyperglycemia. We discussed intensive lifestyle modifications today with an emphasis on weight loss as well as increasing exercise and decreasing simple carbohydrates in her diet.  Hypothyroid Johnasia was informed of the importance of good thyroid control to help with weight loss efforts. She was also informed that supertheraputic thyroid levels are dangerous and will not improve weight loss results. We will check thyroid panel today and January will follow up as directed.  Depression Screen Fergie had a mildly positive depression screening. Depression is commonly associated with obesity and often results  in emotional eating behaviors. We will monitor this closely and work on CBT to help improve the non-hunger eating patterns. Referral to Psychology may be required if no improvement is seen as she continues in  our clinic.  Obesity Reshanda is currently in the action stage of change and her goal is to continue with weight loss efforts. I recommend Thereasa begin the structured treatment plan as follows:  She has agreed to follow the Category 3 plan Sallie has been instructed to eventually work up to a goal of 150 minutes of combined cardio and strengthening exercise per week for weight loss and overall health benefits. We discussed the following Behavioral Modification Strategies today: increase H2O intake, planning for success, increasing lean protein intake, decreasing simple carbohydrates   and work on meal planning and easy cooking plans   She was informed of the importance of frequent follow up visits to maximize her success with intensive lifestyle modifications for her multiple health conditions. She was informed we would discuss her lab results at her next visit unless there is a critical issue that needs to be addressed sooner. Yakima agreed to keep her next visit at the agreed upon time to discuss these results.    OBESITY BEHAVIORAL INTERVENTION VISIT  Today's visit was # 1 out of 22.  Starting weight: 277 lbs Starting date: 49.08 Today's weight : 277 lbs Today's date: 10/05/2017 Total lbs lost to date: 0 (Patients must lose 7 lbs in the first 6 months to continue with counseling)   ASK: We discussed the diagnosis of obesity with [name] today and Abbagayle agreed to give [name] permission to discuss obesity behavioral modification therapy today.  ASSESS: Annisten has the diagnosis of obesity and her BMI today is 49.08 Ellesse is in the action stage of change   ADVISE: Rimsha was educated on the multiple health risks of obesity as well as the benefit of weight loss to improve her health. She was advised of the need for long term treatment and the importance of lifestyle modifications.  AGREE: Multiple dietary modification options and treatment options were discussed and  Loistine agreed to the  above obesity treatment plan.   I, Doreene Nest, am acting as transcriptionist for  Eber Jones, MD   I have reviewed the above documentation for accuracy and completeness, and I agree with the above. - Ilene Qua, MD

## 2017-10-06 NOTE — Telephone Encounter (Signed)
Spoke with pt and reminded pt of remote transmission that is due today. Pt verbalized understanding.   

## 2017-10-07 LAB — CUP PACEART REMOTE DEVICE CHECK
Battery Remaining Longevity: 106 mo
Battery Voltage: 3.01 V
Brady Statistic RV Percent Paced: 0.01 %
Date Time Interrogation Session: 20190416073623
HighPow Impedance: 76 Ohm
Implantable Lead Implant Date: 20150706
Implantable Lead Location: 753860
Implantable Pulse Generator Implant Date: 20150706
Lead Channel Impedance Value: 494 Ohm
Lead Channel Impedance Value: 608 Ohm
Lead Channel Pacing Threshold Amplitude: 0.375 V
Lead Channel Pacing Threshold Pulse Width: 0.4 ms
Lead Channel Sensing Intrinsic Amplitude: 11.25 mV
Lead Channel Sensing Intrinsic Amplitude: 11.25 mV
Lead Channel Setting Pacing Amplitude: 2 V
Lead Channel Setting Pacing Pulse Width: 0.4 ms
Lead Channel Setting Sensing Sensitivity: 0.3 mV

## 2017-10-07 NOTE — Progress Notes (Signed)
Remote ICD transmission.   

## 2017-10-08 ENCOUNTER — Encounter: Payer: Self-pay | Admitting: Cardiology

## 2017-10-19 ENCOUNTER — Ambulatory Visit (INDEPENDENT_AMBULATORY_CARE_PROVIDER_SITE_OTHER): Payer: BLUE CROSS/BLUE SHIELD | Admitting: Family Medicine

## 2017-10-19 VITALS — BP 92/60 | HR 59 | Temp 97.8°F | Ht 63.0 in | Wt 273.0 lb

## 2017-10-19 DIAGNOSIS — I5022 Chronic systolic (congestive) heart failure: Secondary | ICD-10-CM

## 2017-10-19 DIAGNOSIS — E559 Vitamin D deficiency, unspecified: Secondary | ICD-10-CM | POA: Diagnosis not present

## 2017-10-19 DIAGNOSIS — E8881 Metabolic syndrome: Secondary | ICD-10-CM

## 2017-10-19 DIAGNOSIS — Z9189 Other specified personal risk factors, not elsewhere classified: Secondary | ICD-10-CM | POA: Diagnosis not present

## 2017-10-19 DIAGNOSIS — Z6841 Body Mass Index (BMI) 40.0 and over, adult: Secondary | ICD-10-CM

## 2017-10-19 MED ORDER — VITAMIN D (ERGOCALCIFEROL) 1.25 MG (50000 UNIT) PO CAPS: 50000.0000 [IU] | ORAL_CAPSULE | ORAL | 0 refills | Status: DC

## 2017-10-19 MED ORDER — METFORMIN HCL 500 MG PO TABS
500.0000 mg | ORAL_TABLET | Freq: Every day | ORAL | 0 refills | Status: DC
Start: 2017-10-19 — End: 2017-11-03

## 2017-10-21 ENCOUNTER — Ambulatory Visit (INDEPENDENT_AMBULATORY_CARE_PROVIDER_SITE_OTHER): Payer: BLUE CROSS/BLUE SHIELD | Admitting: Family Medicine

## 2017-10-21 NOTE — Progress Notes (Signed)
Office: [phone]  /  Fax: [phone]   HPI:   Chief Complaint: OBESITY [name] is here to discuss her progress with her obesity treatment plan. She is on the Category 3 plan and is following her eating plan approximately 100 % of the time. She states she is exercising 0 minutes 0 times per week. Thia found it to be a good deal of protein at dinner. No hunger and not much craving.  Her weight is 274 lb (124.3 kg) today and has had a weight loss of 4 pounds over a period of 2 weeks since her last visit. She has lost 4 lbs since starting treatment with [name].  Vitamin D Deficiency [name] has a diagnosis of vitamin D deficiency. She is not on Vit D supplement and denies nausea, vomiting or muscle weakness.  Insulin Resistance [name] has a diagnosis of insulin resistance based on her elevated fasting insulin level >5. Insulin of 22.5. Although [name]'s blood glucose readings are still under good control, insulin resistance puts her at greater risk of metabolic syndrome and diabetes. She is not taking metformin currently and continues to work on diet and exercise to decrease risk of diabetes.  At risk for diabetes [name] is at higher than average risk for developing diabetes due to her obesity and insulin resistance. She currently denies polyuria or polydipsia.  Congestive Heart Failure [name] has congestive heart failure. She sees Dr. [name], Cardiologist. She is to let Cardiology know if dizzy or lightheaded.  ALLERGIES: No Known Allergies  MEDICATIONS: Current Outpatient Medications on File Prior to Visit  Medication Sig Dispense Refill  . atorvastatin (LIPITOR) 40 MG tablet Take 1 tablet (40 mg total) by mouth daily. 30 tablet 3  . carvedilol (COREG) 25 MG tablet Take 1 tablet (25 mg total) by mouth 2 (two) times daily with a meal. 60 tablet 6  . digoxin (LANOXIN) 0.125 MG tablet TAKE 1 TABLET BY MOUTH ONCE DAILY 30 tablet 6  . ENTRESTO 24-26 MG TAKE 1 TABLET BY MOUTH TWICE DAILY 60  tablet 6  . furosemide (LASIX) 40 MG tablet TAKE 2 TABLETS BY MOUTH (80 MG) IN THE MORNING AND 1 TAB (40 MG) IN THE EVENING, AS INSTRUCTED. 90 tablet 3  . ivabradine (CORLANOR) 5 MG TABS tablet Take 1 tablet (5 mg total) by mouth 2 (two) times daily with a meal. 60 tablet 6  . potassium chloride SA (K-DUR,KLOR-CON) 20 MEQ tablet TAKE 1 TABLET BY MOUTH ONCE DAILY 60 tablet 6  . spironolactone (ALDACTONE) 25 MG tablet TAKE 1 TABLET BY MOUTH ONCE DAILY 30 tablet 6   No current facility-administered medications on file prior to visit.     PAST MEDICAL HISTORY: Past Medical History:  Diagnosis Date  . Automatic implantable cardioverter-defibrillator in situ   . Chronic CHF (congestive heart failure) (HCC)    a. EF 15-20% b. RHC (09/2013) RA 14, RV 57/22, PA 64/36 (48), PCWP 18, FIck CO/CI 3.7/1.6, PVR 8.1 WU, PA sat 47%   . History of stomach ulcers   . Hypertension   . Hypotension   . Hypothyroidism   . Morbid obesity ([place])   . Myocardial infarction ([place]) 08/2013  . Nocturnal dyspnea   . Nonischemic cardiomyopathy ([place])   . Sinus tachycardia   . Snoring-prob OSA 09/04/2011  . Sprint Fidelis ICD lead RECALL  [id number]     PAST SURGICAL HISTORY: Past Surgical History:  Procedure Laterality Date  . BREATH TEK H PYLORI N/A 11/09/2014   Procedure: BREATH TEK H  PYLORI;  Surgeon: Greer Pickerel, MD;  Location: [facility];  Service: General;  Laterality: N/A;  . CARDIAC CATHETERIZATION  ~ 2006; 09/2013  . CARDIAC CATHETERIZATION N/A 05/23/2016   Procedure: Right Heart Cath;  Surgeon: Larey Dresser, MD;  Location: [facility];  Service: Cardiovascular;  Laterality: N/A;  . CARDIAC DEFIBRILLATOR PLACEMENT  2006; 12/26/2013   Medtronic [id number]; 12-2013 ICD gen change and RV lead revision with new 1751 RV lead by Dr [name]  . CESAREAN SECTION  1999  . IMPLANTABLE CARDIOVERTER DEFIBRILLATOR GENERATOR CHANGE N/A 12/26/2013   Procedure: IMPLANTABLE CARDIOVERTER DEFIBRILLATOR GENERATOR CHANGE;   Surgeon: Deboraha Sprang, MD;  Location: [facility] CATH LAB;  Service: Cardiovascular;  Laterality: N/A;  . LEAD REVISION N/A 12/26/2013   Procedure: LEAD REVISION;  Surgeon: Deboraha Sprang, MD;  Location: [facility] C[facility] LAB;  Service: Cardiovascular;  Laterality: N/A;  . RIGHT HEART CATHETERIZATION N/A 02/15/2014   Procedure: RIGHT HEART CATH;  Surgeon: Larey Dresser, MD;  Location: [facility] CATH LAB;  Service: Cardiovascular;  Laterality: N/A;  . TUBAL LIGATION  1999    SOCIAL HISTORY: Social History   Tobacco Use  . Smoking status: Never Smoker  . Smokeless tobacco: Never Used  Substance Use Topics  . Alcohol use: No  . Drug use: No    FAMILY HISTORY: Family History  Problem Relation Age of Onset  . Heart disease Maternal Grandmother   . Heart disease Mother   . Obesity Mother   . High blood pressure Father   . Stroke Father     ROS: Review of Systems  Constitutional: Positive for weight loss.  Gastrointestinal: Negative for nausea and vomiting.  Musculoskeletal:       Negative muscle weakness    PHYSICAL EXAM: Blood pressure 92/60, pulse (!) 59, temperature 97.8 F (36.6 C), height 5\' 3"  (1.6 m), weight 274 lb (124.3 kg), SpO2 100 %. Body mass index is 48.54 kg/m. Physical Exam  Constitutional: She is oriented to person, place, and time. She appears well-developed and well-nourished.  Cardiovascular: Normal rate.  Pulmonary/Chest: Effort normal.  Musculoskeletal: Normal range of motion.  Neurological: She is oriented to person, place, and time.  Skin: Skin is warm and dry.  Psychiatric: She has a normal mood and affect. Her behavior is normal.  Vitals reviewed.   RECENT LABS AND TESTS: BMET    Component Value Date/Time   NA 139 08/25/2017 1114   NA 138 09/24/2013 0536   K 4.0 08/25/2017 1114   K 3.7 09/24/2013 0536   CL 105 08/25/2017 1114   CL 107 09/24/2013 0536   CO2 26 08/25/2017 1114   CO2 25 09/24/2013 0536   GLUCOSE 93 08/25/2017 1114   GLUCOSE 82 09/24/2013  0536   BUN 11 08/25/2017 1114   BUN 11 09/24/2013 0536   CREATININE 0.99 08/25/2017 1114   CREATININE 1.08 09/24/2013 0536   CALCIUM 9.6 08/25/2017 1114   CALCIUM 8.6 09/24/2013 0536   GFRNONAA >60 08/25/2017 1114   GFRNONAA >60 09/24/2013 0536   GFRAA >60 08/25/2017 1114   GFRAA >60 09/24/2013 0536   Lab Results  Component Value Date   HGBA1C 5.6 10/05/2017   HGBA1C 5.9 09/19/2013   Lab Results  Component Value Date   INSULIN 22.5 10/05/2017   CBC    Component Value Date/Time   WBC 8.9 10/05/2017 1218   WBC 10.6 (H) 05/20/2016 1515   RBC 4.84 10/05/2017 1218   RBC 4.64 05/20/2016 1515   HGB 13.6  10/05/2017 1218   HCT 41.7 10/05/2017 1218   PLT 269 05/20/2016 1515   PLT 195 09/23/2013 0443   MCV 86 10/05/2017 1218   MCV 85 09/23/2013 0443   MCH 28.1 10/05/2017 1218   MCH 28.2 05/20/2016 1515   MCHC 32.6 10/05/2017 1218   MCHC 32.8 05/20/2016 1515   RDW 14.5 10/05/2017 1218   RDW 15.4 (H) 09/23/2013 0443   LYMPHSABS 1.9 10/05/2017 1218   LYMPHSABS 2.0 09/23/2013 0443   MONOABS 0.5 12/19/2013 1158   MONOABS 0.4 09/23/2013 0443   EOSABS 0.2 10/05/2017 1218   EOSABS 0.1 09/23/2013 0443   BASOSABS 0.0 10/05/2017 1218   BASOSABS 0.1 09/23/2013 0443   Iron/TIBC/Ferritin/ %Sat No results found for: IRON, TIBC, FERRITIN, IRONPCTSAT Lipid Panel     Component Value Date/Time   CHOL 171 10/05/2017 1218   CHOL 183 09/20/2013 0424   TRIG 97 10/05/2017 1218   TRIG 105 09/20/2013 0424   HDL 57 10/05/2017 1218   HDL 40 09/20/2013 0424   CHOLHDL 3.3 07/21/2017 0854   VLDL 18 07/21/2017 0854   VLDL 21 09/20/2013 0424   LDLCALC 95 10/05/2017 1218   LDLCALC 122 (H) 09/20/2013 0424   Hepatic Function Panel     Component Value Date/Time   PROT 7.5 07/21/2017 0854   PROT 7.7 09/19/2013 0431   ALBUMIN 3.5 07/21/2017 0854   ALBUMIN 3.5 09/19/2013 0431   AST 17 07/21/2017 0854   AST 36 09/19/2013 0431   ALT 13 (L) 07/21/2017 0854   ALT 32 09/19/2013 0431   ALKPHOS 73  07/21/2017 0854   ALKPHOS 65 09/19/2013 0431   BILITOT 0.6 07/21/2017 0854   BILITOT 0.4 09/19/2013 0431   BILIDIR <0.1 (L) 07/21/2017 0854   IBILI NOT CALCULATED 07/21/2017 0854      Component Value Date/Time   TSH 3.680 10/05/2017 1218   TSH 5.995 (H) 11/14/2016 1354   TSH 7.068 (H) 11/10/2016 1040   TSH 2.90 02/29/2008 1132  Results for KIMANH, TEMPLEMAN (MRN [id number]) as of 10/22/2017 07:39  Ref. Range 10/05/2017 12:18  Vitamin D, 25-Hydroxy Latest Ref Range: 30.0 - 100.0 ng/mL 6.2 (L)    ASSESSMENT AND PLAN: Vitamin D deficiency - Plan: Vitamin D, Ergocalciferol, (DRISDOL) 50000 units CAPS capsule  Insulin resistance - Plan: metFORMIN (GLUCOPHAGE) 500 MG tablet  SYSTOLIC HEART FAILURE, CHRONIC  At risk for diabetes mellitus  Class 3 severe obesity with serious comorbidity and body mass index (BMI) of 45.0 to 49.9 in adult, unspecified obesity type ([place])  PLAN:  Vitamin D Deficiency [name] was informed that low vitamin D levels contributes to fatigue and are associated with obesity, breast, and colon cancer. Camyah agrees to start prescription Vit D @50 ,000 IU every week #4 with no refills. She will follow up for routine testing of vitamin D, at least 2-3 times per year. She was informed of the risk of over-replacement of vitamin D and agrees to not increase her dose unless she discusses this with [name] first. We will recheck labs in 3 months and [name] agrees to follow up with our clinic in 2 weeks.  Insulin Resistance [name] will continue to work on weight loss, exercise, and decreasing simple carbohydrates in her diet to help decrease the risk of diabetes. We dicussed metformin including benefits and risks. She was informed that eating too many simple carbohydrates or too many calories at one sitting increases the likelihood of GI side effects. [name] agrees to start metformin 500 mg PO q AM #30  with no refills. We will recheck labs in 3 months and Jayleen agrees to follow up with our  clinic in 2 weeks as directed to monitor her progress.  Diabetes risk counselling [name] was given extended (30 minutes) diabetes prevention counseling today. She is 47 y.o. female and has risk factors for diabetes including obesity and insulin resistance. We discussed intensive lifestyle modifications today with an emphasis on weight loss as well as increasing exercise and decreasing simple carbohydrates in her diet.  Congestive Heart Failure [name] agrees to continue her current medications and she agrees to follow up with our clinic in 2 weeks.  Obesity [name] is currently in the action stage of change. As such, her goal is to continue with weight loss efforts She has agreed to follow the Category 3 plan [name] has been instructed to work up to a goal of 150 minutes of combined cardio and strengthening exercise per week for weight loss and overall health benefits. We discussed the following Behavioral Modification Strategies today: increasing lean protein intake, increasing vegetables, work on meal planning and easy cooking plans, and planning for success   [name] has agreed to follow up with our clinic in 2 weeks. She was informed of the importance of frequent follow up visits to maximize her success with intensive lifestyle modifications for her multiple health conditions.   OBESITY BEHAVIORAL INTERVENTION VISIT  Today's visit was # 2 out of 22.  Starting weight: 277 lbs Starting date: 10/05/17 Today's weight : 274 lbs Today's date: 10/19/2017 Total lbs lost to date: 4 (Patients must lose 7 lbs in the first 6 months to continue with counseling)   ASK: We discussed the diagnosis of obesity with [name] today and [name] agreed to give [name] permission to discuss obesity behavioral modification therapy today.  ASSESS: [name] has the diagnosis of obesity and her BMI today is 48.55 [name] is in the action stage of change   ADVISE: [name] was educated on the multiple health risks of  obesity as well as the benefit of weight loss to improve her health. She was advised of the need for long term treatment and the importance of lifestyle modifications.  AGREE: Multiple dietary modification options and treatment options were discussed and  [name] agreed to the above obesity treatment plan.  I, Trixie Dredge, am acting as transcriptionist for Ilene Qua, MD  I have reviewed the above documentation for accuracy and completeness, and I agree with the above. - Ilene Qua, MD

## 2017-10-26 ENCOUNTER — Ambulatory Visit (INDEPENDENT_AMBULATORY_CARE_PROVIDER_SITE_OTHER): Payer: BLUE CROSS/BLUE SHIELD | Admitting: Family Medicine

## 2017-11-03 ENCOUNTER — Ambulatory Visit (INDEPENDENT_AMBULATORY_CARE_PROVIDER_SITE_OTHER): Payer: BLUE CROSS/BLUE SHIELD | Admitting: Family Medicine

## 2017-11-03 VITALS — BP 94/67 | HR 60 | Temp 97.9°F | Ht 63.0 in | Wt 270.0 lb

## 2017-11-03 DIAGNOSIS — E8881 Metabolic syndrome: Secondary | ICD-10-CM | POA: Diagnosis not present

## 2017-11-03 DIAGNOSIS — E559 Vitamin D deficiency, unspecified: Secondary | ICD-10-CM | POA: Diagnosis not present

## 2017-11-03 DIAGNOSIS — Z6841 Body Mass Index (BMI) 40.0 and over, adult: Secondary | ICD-10-CM | POA: Diagnosis not present

## 2017-11-03 DIAGNOSIS — Z9189 Other specified personal risk factors, not elsewhere classified: Secondary | ICD-10-CM

## 2017-11-03 MED ORDER — METFORMIN HCL 500 MG PO TABS: 500.0000 mg | ORAL_TABLET | Freq: Every day | ORAL | 0 refills | Status: DC

## 2017-11-04 NOTE — Progress Notes (Signed)
Office: [phone]  /  Fax: [phone]   HPI:   Chief Complaint: OBESITY [name] is here to discuss her progress with her obesity treatment plan. She is on the Category 3 plan and is following her eating plan approximately 85 % of the time. She states she is exercising 0 minutes 0 times per week. [name] enjoyed a slice of pound cake on Mother's Day, otherwise she followed the plan the entire time, and she enjoyed it.  Her weight is 270 lb (122.5 kg) today and has had a weight loss of 4 pounds over a period of 2 weeks since her last visit. She has lost 7 lbs since starting treatment with [name].  Insulin Resistance [name] has a diagnosis of insulin resistance based on her elevated fasting insulin level >5. Although [name]'s blood glucose readings are still under good control, insulin resistance puts her at greater risk of metabolic syndrome and diabetes. She is taking metformin currently and continues to work on diet and exercise to decrease risk of diabetes. She denies carb cravings.  At risk for diabetes [name] is at higher than average risk for developing diabetes due to her obesity. She currently denies polyuria or polydipsia.  Vitamin D deficiency [name] has a diagnosis of vitamin D deficiency. She is currently taking vit D and denies nausea, vomiting or muscle weakness.  ALLERGIES: No Known Allergies  MEDICATIONS: Current Outpatient Medications on File Prior to Visit  Medication Sig Dispense Refill  . atorvastatin (LIPITOR) 40 MG tablet Take 1 tablet (40 mg total) by mouth daily. 30 tablet 3  . carvedilol (COREG) 25 MG tablet Take 1 tablet (25 mg total) by mouth 2 (two) times daily with a meal. 60 tablet 6  . digoxin (LANOXIN) 0.125 MG tablet TAKE 1 TABLET BY MOUTH ONCE DAILY 30 tablet 6  . ENTRESTO 24-26 MG TAKE 1 TABLET BY MOUTH TWICE DAILY 60 tablet 6  . furosemide (LASIX) 40 MG tablet TAKE 2 TABLETS BY MOUTH (80 MG) IN THE MORNING AND 1 TAB (40 MG) IN THE EVENING, AS INSTRUCTED. 90  tablet 3  . ivabradine (CORLANOR) 5 MG TABS tablet Take 1 tablet (5 mg total) by mouth 2 (two) times daily with a meal. 60 tablet 6  . potassium chloride SA (K-DUR,KLOR-CON) 20 MEQ tablet TAKE 1 TABLET BY MOUTH ONCE DAILY 60 tablet 6  . spironolactone (ALDACTONE) 25 MG tablet TAKE 1 TABLET BY MOUTH ONCE DAILY 30 tablet 6  . Vitamin D, Ergocalciferol, (DRISDOL) 50000 units CAPS capsule Take 1 capsule (50,000 Units total) by mouth every 7 (seven) days. 4 capsule 0   No current facility-administered medications on file prior to visit.     PAST MEDICAL HISTORY: Past Medical History:  Diagnosis Date  . Automatic implantable cardioverter-defibrillator in situ   . Chronic CHF (congestive heart failure) (HCC)    a. EF 15-20% b. RHC (09/2013) RA 14, RV 57/22, PA 64/36 (48), PCWP 18, FIck CO/CI 3.7/1.6, PVR 8.1 WU, PA sat 47%   . History of stomach ulcers   . Hypertension   . Hypotension   . Hypothyroidism   . Morbid obesity ([place])   . Myocardial infarction ([place]) 08/2013  . Nocturnal dyspnea   . Nonischemic cardiomyopathy ([place])   . Sinus tachycardia   . Snoring-prob OSA 09/04/2011  . Sprint Fidelis ICD lead RECALL  [id number]     PAST SURGICAL HISTORY: Past Surgical History:  Procedure Laterality Date  . BREATH TEK H PYLORI N/A 11/09/2014   Procedure: BREATH TEK  Kandis Ban;  Surgeon: Greer Pickerel, MD;  Location: [facility];  Service: General;  Laterality: N/A;  . CARDIAC CATHETERIZATION  ~ 2006; 09/2013  . CARDIAC CATHETERIZATION N/A 05/23/2016   Procedure: Right Heart Cath;  Surgeon: Larey Dresser, MD;  Location: [facility];  Service: Cardiovascular;  Laterality: N/A;  . CARDIAC DEFIBRILLATOR PLACEMENT  2006; 12/26/2013   Med[employer]onic [id number]; 12-2013 ICD gen change and RV lead revision with new 6295 RV lead by Dr [name]  . CESAREAN SECTION  1999  . IMPLANTABLE CARDIOVERTER DEFIBRILLATOR GENERATOR CHANGE N/A 12/26/2013   Procedure: IMPLANTABLE CARDIOVERTER DEFIBRILLATOR GENERATOR CHANGE;   Surgeon: Deboraha Sprang, MD;  Location: [facility] LAB;  Service: Cardiovascular;  Laterality: N/A;  . LEAD REVISION N/A 12/26/2013   Procedure: LEAD REVISION;  Surgeon: Deboraha Sprang, MD;  Location: [facility] CATH LAB;  Service: Cardiovascular;  Laterality: N/A;  . RIGHT HEART CATHETERIZATION N/A 02/15/2014   Procedure: RIGHT HEART CATH;  Surgeon: Larey Dresser, MD;  Location: [facility] CATH LAB;  Service: Cardiovascular;  Laterality: N/A;  . TUBAL LIGATION  1999    SOCIAL HISTORY: Social History   Tobacco Use  . Smoking status: Never Smoker  . Smokeless tobacco: Never Used  Substance Use Topics  . Alcohol use: No  . Drug use: No    FAMILY HISTORY: Family History  Problem Relation Age of Onset  . Heart disease Maternal Grandmother   . Heart disease Mother   . Obesity Mother   . High blood pressure Father   . Stroke Father     ROS: Review of Systems  Constitutional: Positive for weight loss.  Gastrointestinal: Negative for nausea and vomiting.  Genitourinary: Negative for frequency.  Musculoskeletal:       Negative for muscle weakness  Endo/Heme/Allergies: Negative for polydipsia.       Negative for carb cravings    PHYSICAL EXAM: Blood pressure 94/67, pulse 60, temperature 97.9 F (36.6 C), temperature source Oral, height 5\' 3"  (1.6 m), weight 270 lb (122.5 kg), SpO2 100 %. Body mass index is 47.83 kg/m. Physical Exam  Constitutional: She is oriented to person, place, and time. She appears well-developed and well-nourished.  Cardiovascular: Normal rate.  Pulmonary/Chest: Effort normal.  Musculoskeletal: Normal range of motion.  Neurological: She is oriented to person, place, and time.  Skin: Skin is warm and dry.  Psychiatric: She has a normal mood and affect. Her behavior is normal.  Vitals reviewed.   RECENT LABS AND TESTS: BMET    Component Value Date/Time   NA 139 08/25/2017 1114   NA 138 09/24/2013 0536   K 4.0 08/25/2017 1114   K 3.7 09/24/2013 0536   CL 105  08/25/2017 1114   CL 107 09/24/2013 0536   CO2 26 08/25/2017 1114   CO2 25 09/24/2013 0536   GLUCOSE 93 08/25/2017 1114   GLUCOSE 82 09/24/2013 0536   BUN 11 08/25/2017 1114   BUN 11 09/24/2013 0536   CREATININE 0.99 08/25/2017 1114   CREATININE 1.08 09/24/2013 0536   CALCIUM 9.6 08/25/2017 1114   CALCIUM 8.6 09/24/2013 0536   GFRNONAA >60 08/25/2017 1114   GFRNONAA >60 09/24/2013 0536   GFRAA >60 08/25/2017 1114   GFRAA >60 09/24/2013 0536   Lab Results  Component Value Date   HGBA1C 5.6 10/05/2017   HGBA1C 5.9 09/19/2013   Lab Results  Component Value Date   INSULIN 22.5 10/05/2017   CBC    Component Value Date/Time   WBC 8.9 10/05/2017  1218   WBC 10.6 (H) 05/20/2016 1515   RBC 4.84 10/05/2017 1218   RBC 4.64 05/20/2016 1515   HGB 13.6 10/05/2017 1218   HCT 41.7 10/05/2017 1218   PLT 269 05/20/2016 1515   PLT 195 09/23/2013 0443   MCV 86 10/05/2017 1218   MCV 85 09/23/2013 0443   MCH 28.1 10/05/2017 1218   MCH 28.2 05/20/2016 1515   MCHC 32.6 10/05/2017 1218   MCHC 32.8 05/20/2016 1515   RDW 14.5 10/05/2017 1218   RDW 15.4 (H) 09/23/2013 0443   LYMPHSABS 1.9 10/05/2017 1218   LYMPHSABS 2.0 09/23/2013 0443   MONOABS 0.5 12/19/2013 1158   MONOABS 0.4 09/23/2013 0443   EOSABS 0.2 10/05/2017 1218   EOSABS 0.1 09/23/2013 0443   BASOSABS 0.0 10/05/2017 1218   BASOSABS 0.1 09/23/2013 0443   Iron/TIBC/Ferritin/ %Sat No results found for: IRON, TIBC, FERRITIN, IRONPCTSAT Lipid Panel     Component Value Date/Time   CHOL 171 10/05/2017 1218   CHOL 183 09/20/2013 0424   TRIG 97 10/05/2017 1218   TRIG 105 09/20/2013 0424   HDL 57 10/05/2017 1218   HDL 40 09/20/2013 0424   CHOLHDL 3.3 07/21/2017 0854   VLDL 18 07/21/2017 0854   VLDL 21 09/20/2013 0424   LDLCALC 95 10/05/2017 1218   LDLCALC 122 (H) 09/20/2013 0424   Hepatic Function Panel     Component Value Date/Time   PROT 7.5 07/21/2017 0854   PROT 7.7 09/19/2013 0431   ALBUMIN 3.5 07/21/2017 0854    ALBUMIN 3.5 09/19/2013 0431   AST 17 07/21/2017 0854   AST 36 09/19/2013 0431   ALT 13 (L) 07/21/2017 0854   ALT 32 09/19/2013 0431   ALKPHOS 73 07/21/2017 0854   ALKPHOS 65 09/19/2013 0431   BILITOT 0.6 07/21/2017 0854   BILITOT 0.4 09/19/2013 0431   BILIDIR <0.1 (L) 07/21/2017 0854   IBILI NOT CALCULATED 07/21/2017 0854      Component Value Date/Time   TSH 3.680 10/05/2017 1218   TSH 5.995 (H) 11/14/2016 1354   TSH 7.068 (H) 11/10/2016 1040   TSH 2.90 02/29/2008 1132   Results for EDGAR, CORRIGAN (MRN [id number]) as of 11/04/2017 07:28  Ref. Range 10/05/2017 12:18  Vitamin D, 25-Hydroxy Latest Ref Range: 30.0 - 100.0 ng/mL 6.2 (L)   ASSESSMENT AND PLAN: Insulin resistance - Plan: metFORMIN (GLUCOPHAGE) 500 MG tablet  Vitamin D deficiency  At risk for diabetes mellitus  Class 3 severe obesity with serious comorbidity and body mass index (BMI) of 45.0 to 49.9 in adult, unspecified obesity type ([place])  PLAN:  Insulin Resistance [name] will continue to work on weight loss, exercise, and decreasing simple carbohydrates in her diet to help decrease the risk of diabetes. We dicussed metformin including benefits and risks. She was informed that eating too many simple carbohydrates or too many calories at one sitting increases the likelihood of GI side effects. Jaleeya requested metformin for now and prescription was written today for 1 month refill. [name] agreed to follow up with [name] as directed to monitor her progress.  Diabetes risk counseling Richelle was given extended (15 minutes) diabetes prevention counseling today. She is 47 y.o. female and has risk factors for diabetes including obesity and insulin resistance. We discussed intensive lifestyle modifications today with an emphasis on weight loss as well as increasing exercise and decreasing simple carbohydrates in her diet.  Vitamin D Deficiency Aroura was informed that low vitamin D levels contributes to fatigue and are associated with  obesity, breast, and colon cancer. She agrees to continue to take prescription Vit D @50 ,000 IU every week (no refill needed) and will follow up for routine testing of vitamin D, at least 2-3 times per year. She was informed of the risk of over-replacement of vitamin D and agrees to not increase her dose unless she discusses this with [name] first.  Obesity [name] is currently in the action stage of change. As such, her goal is to continue with weight loss efforts She has agreed to follow the Category 3 plan [name] has been instructed to work up to a goal of 150 minutes of combined cardio and strengthening exercise per week for weight loss and overall health benefits. We discussed the following Behavioral Modification Strategies today: better snacking choices, planning for success, increasing lean protein intake, increasing vegetables and work on meal planning and easy cooking plans  [name] has agreed to follow up with our clinic in 2 weeks. She was informed of the importance of frequent follow up visits to maximize her success with intensive lifestyle modifications for her multiple health conditions.   OBESITY BEHAVIORAL INTERVENTION VISIT  Today's visit was # 3 out of 22.  Starting weight: 277 lbs Starting date: 10/05/17 Today's weight : 270 lbs  Today's date: 11/03/2017 Total lbs lost to date: 7 (Patients must lose 7 lbs in the first 6 months to continue with counseling)   ASK: We discussed the diagnosis of obesity with [name] today and [name] agreed to give [name] permission to discuss obesity behavioral modification therapy today.  ASSESS: Seeley has the diagnosis of obesity and her BMI today is 47.84 [name] is in the action stage of change   ADVISE: [name] was educated on the multiple health risks of obesity as well as the benefit of weight loss to improve her health. She was advised of the need for long term treatment and the importance of lifestyle modifications.  AGREE: Multiple  dietary modification options and treatment options were discussed and  [name] agreed to the above obesity treatment plan. I, Doreene Nest, am acting as transcriptionist for Eber Jones, MD  I have reviewed the above documentation for accuracy and completeness, and I agree with the above. - Ilene Qua, MD

## 2017-11-23 ENCOUNTER — Other Ambulatory Visit (HOSPITAL_COMMUNITY): Payer: Self-pay | Admitting: Cardiology

## 2017-11-23 ENCOUNTER — Other Ambulatory Visit (HOSPITAL_COMMUNITY): Payer: Self-pay | Admitting: Internal Medicine

## 2017-11-23 ENCOUNTER — Other Ambulatory Visit (INDEPENDENT_AMBULATORY_CARE_PROVIDER_SITE_OTHER): Payer: Self-pay | Admitting: Cardiology

## 2017-11-23 ENCOUNTER — Other Ambulatory Visit (INDEPENDENT_AMBULATORY_CARE_PROVIDER_SITE_OTHER): Payer: Self-pay | Admitting: Family Medicine

## 2017-11-23 DIAGNOSIS — I5022 Chronic systolic (congestive) heart failure: Secondary | ICD-10-CM

## 2017-11-23 DIAGNOSIS — E559 Vitamin D deficiency, unspecified: Secondary | ICD-10-CM

## 2017-11-23 DIAGNOSIS — E8881 Metabolic syndrome: Secondary | ICD-10-CM

## 2017-11-24 ENCOUNTER — Other Ambulatory Visit (INDEPENDENT_AMBULATORY_CARE_PROVIDER_SITE_OTHER): Payer: Self-pay | Admitting: Internal Medicine

## 2017-11-24 DIAGNOSIS — E559 Vitamin D deficiency, unspecified: Secondary | ICD-10-CM

## 2017-11-25 ENCOUNTER — Ambulatory Visit (INDEPENDENT_AMBULATORY_CARE_PROVIDER_SITE_OTHER): Payer: BLUE CROSS/BLUE SHIELD | Admitting: Family Medicine

## 2017-11-25 VITALS — BP 94/72 | HR 85 | Temp 98.3°F | Ht 63.0 in | Wt 266.0 lb

## 2017-11-25 DIAGNOSIS — E559 Vitamin D deficiency, unspecified: Secondary | ICD-10-CM

## 2017-11-25 DIAGNOSIS — Z6841 Body Mass Index (BMI) 40.0 and over, adult: Secondary | ICD-10-CM

## 2017-11-25 DIAGNOSIS — Z9189 Other specified personal risk factors, not elsewhere classified: Secondary | ICD-10-CM

## 2017-11-25 DIAGNOSIS — E8881 Metabolic syndrome: Secondary | ICD-10-CM | POA: Diagnosis not present

## 2017-11-25 MED ORDER — VITAMIN D (ERGOCALCIFEROL) 1.25 MG (50000 UNIT) PO CAPS: 50000.0000 [IU] | ORAL_CAPSULE | ORAL | 0 refills | Status: DC

## 2017-11-25 NOTE — Progress Notes (Signed)
Office: [phone]  /  Fax: [phone]   HPI:   Chief Complaint: OBESITY [name] is here to discuss her progress with her obesity treatment plan. She is on the Category 3 plan and is following her eating plan approximately 90 % of the time. She states she is exercising 0 minutes 0 times per week. [name] went out to eat a couple times. Still enjoying meal plan just looking for other options at dinner if she goes out to eat.  Her weight is 266 lb (120.7 kg) today and has had a weight loss of 4 pounds over a period of 3 weeks since her last visit. She has lost 11 lbs since starting treatment with [name].  Vitamin D Deficiency [name] has a diagnosis of vitamin D deficiency. She is currently taking prescription Vit D. She notes fatigue and denies nausea, vomiting or muscle weakness.  At risk for osteopenia and osteoporosis [name] is at higher risk of osteopenia and osteoporosis due to vitamin D deficiency.   Insulin Resistance [name] has a diagnosis of insulin resistance based on her elevated fasting insulin level >5. Although [name]'s blood glucose readings are still under good control, insulin resistance puts her at greater risk of metabolic syndrome and diabetes. She denies carbohydrate cravings or GI side effects on metformin and continues to work on diet and exercise to decrease risk of diabetes.  ALLERGIES: No Known Allergies  MEDICATIONS: Current Outpatient Medications on File Prior to Visit  Medication Sig Dispense Refill  . atorvastatin (LIPITOR) 40 MG tablet TAKE ONE TABLET BY MOUTH DAILY 30 tablet 3  . carvedilol (COREG) 25 MG tablet Take 1 tablet (25 mg total) by mouth 2 (two) times daily with a meal. 60 tablet 6  . digoxin (LANOXIN) 0.125 MG tablet TAKE 1 TABLET BY MOUTH ONCE DAILY 30 tablet 6  . ENTRESTO 24-26 MG TAKE 1 TABLET BY MOUTH TWICE DAILY 60 tablet 6  . furosemide (LASIX) 40 MG tablet TAKE 2 TABLETS BY MOUTH (8OMG) IN THE MORNING AND TAKE 1 TABLET (40MG ) IN THE EVENING AS  INSTRUCTED 90 tablet 3  . ivabradine (CORLANOR) 5 MG TABS tablet Take 1 tablet (5 mg total) by mouth 2 (two) times daily with a meal. 60 tablet 6  . metFORMIN (GLUCOPHAGE) 500 MG tablet Take 1 tablet (500 mg total) by mouth daily with breakfast. 30 tablet 0  . potassium chloride SA (K-DUR,KLOR-CON) 20 MEQ tablet TAKE 1 TABLET BY MOUTH ONCE DAILY 60 tablet 6  . spironolactone (ALDACTONE) 25 MG tablet TAKE 1 TABLET BY MOUTH ONCE DAILY 30 tablet 6  . Vitamin D, Ergocalciferol, (DRISDOL) 50000 units CAPS capsule Take 1 capsule (50,000 Units total) by mouth every 7 (seven) days. 4 capsule 0   No current facility-administered medications on file prior to visit.     PAST MEDICAL HISTORY: Past Medical History:  Diagnosis Date  . Automatic implantable cardioverter-defibrillator in situ   . Chronic CHF (congestive heart failure) (HCC)    a. EF 15-20% b. RHC (09/2013) RA 14, RV 57/22, PA 64/36 (48), PCWP 18, FIck CO/CI 3.7/1.6, PVR 8.1 WU, PA sat 47%   . History of stomach ulcers   . Hypertension   . Hypotension   . Hypothyroidism   . Morbid obesity ([place])   . Myocardial infarction ([place]) 08/2013  . Nocturnal dyspnea   . Nonischemic cardiomyopathy ([place])   . Sinus tachycardia   . Snoring-prob OSA 09/04/2011  . Sprint Fidelis ICD lead RECALL  [id number]     PAST  SURGICAL HISTORY: Past Surgical History:  Procedure Laterality Date  . BREATH TEK H PYLORI N/A 11/09/2014   Procedure: BREATH TEK H PYLORI;  Surgeon: Greer Pickerel, MD;  Location: [facility];  Service: General;  Laterality: N/A;  . CARDIAC CATHETERIZATION  ~ 2006; 09/2013  . CARDIAC CATHETERIZATION N/A 05/23/2016   Procedure: Right Heart Cath;  Surgeon: Larey Dresser, MD;  Location: [facility];  Service: Cardiovascular;  Laterality: N/A;  . CARDIAC DEFIBRILLATOR PLACEMENT  2006; 12/26/2013   Medtronic [id number]; 12-2013 ICD gen change and RV lead revision with new 7622 RV lead by Dr [name]  . CESAREAN SECTION  1999  . IMPLANTABLE  CARDIOVERTER DEFIBRILLATOR GENERATOR CHANGE N/A 12/26/2013   Procedure: IMPLANTABLE CARDIOVERTER DEFIBRILLATOR GENERATOR CHANGE;  Surgeon: Deboraha Sprang, MD;  Location: [facility] C[facility] LAB;  Service: Cardiovascular;  Laterality: N/A;  . LEAD REVISION N/A 12/26/2013   Procedure: LEAD REVISION;  Surgeon: Deboraha Sprang, MD;  Location: [facility] CATH LAB;  Service: Cardiovascular;  Laterality: N/A;  . RIGHT HEART CATHETERIZATION N/A 02/15/2014   Procedure: RIGHT HEART CATH;  Surgeon: Larey Dresser, MD;  Location: [facility] CATH LAB;  Service: Cardiovascular;  Laterality: N/A;  . TUBAL LIGATION  1999    SOCIAL HISTORY: Social History   Tobacco Use  . Smoking status: Never Smoker  . Smokeless tobacco: Never Used  Substance Use Topics  . Alcohol use: No  . Drug use: No    FAMILY HISTORY: Family History  Problem Relation Age of Onset  . Heart disease Maternal Grandmother   . Heart disease Mother   . Obesity Mother   . High blood pressure Father   . Stroke Father     ROS: Review of Systems  Constitutional: Positive for malaise/fatigue and weight loss.  Gastrointestinal: Negative for nausea and vomiting.  Musculoskeletal:       Negative muscle weakness    PHYSICAL EXAM: Blood pressure 94/72, pulse 85, temperature 98.3 F (36.8 C), temperature source Oral, height 5\' 3"  (1.6 m), weight 266 lb (120.7 kg), SpO2 99 %. Body mass index is 47.12 kg/m. Physical Exam  Constitutional: She is oriented to person, place, and time. She appears well-developed and well-nourished.  Cardiovascular: Normal rate.  Pulmonary/Chest: Effort normal.  Musculoskeletal: Normal range of motion.  Neurological: She is oriented to person, place, and time.  Skin: Skin is warm and dry.  Psychiatric: She has a normal mood and affect. Her behavior is normal.  Vitals reviewed.   RECENT LABS AND TESTS: BMET    Component Value Date/Time   NA 139 08/25/2017 1114   NA 138 09/24/2013 0536   K 4.0 08/25/2017 1114   K 3.7  09/24/2013 0536   CL 105 08/25/2017 1114   CL 107 09/24/2013 0536   CO2 26 08/25/2017 1114   CO2 25 09/24/2013 0536   GLUCOSE 93 08/25/2017 1114   GLUCOSE 82 09/24/2013 0536   BUN 11 08/25/2017 1114   BUN 11 09/24/2013 0536   CREATININE 0.99 08/25/2017 1114   CREATININE 1.08 09/24/2013 0536   CALCIUM 9.6 08/25/2017 1114   CALCIUM 8.6 09/24/2013 0536   GFRNONAA >60 08/25/2017 1114   GFRNONAA >60 09/24/2013 0536   GFRAA >60 08/25/2017 1114   GFRAA >60 09/24/2013 0536   Lab Results  Component Value Date   HGBA1C 5.6 10/05/2017   HGBA1C 5.9 09/19/2013   Lab Results  Component Value Date   INSULIN 22.5 10/05/2017   CBC    Component Value Date/Time  WBC 8.9 10/05/2017 1218   WBC 10.6 (H) 05/20/2016 1515   RBC 4.84 10/05/2017 1218   RBC 4.64 05/20/2016 1515   HGB 13.6 10/05/2017 1218   HCT 41.7 10/05/2017 1218   PLT 269 05/20/2016 1515   PLT 195 09/23/2013 0443   MCV 86 10/05/2017 1218   MCV 85 09/23/2013 0443   MCH 28.1 10/05/2017 1218   MCH 28.2 05/20/2016 1515   MCHC 32.6 10/05/2017 1218   MCHC 32.8 05/20/2016 1515   RDW 14.5 10/05/2017 1218   RDW 15.4 (H) 09/23/2013 0443   LYMPHSABS 1.9 10/05/2017 1218   LYMPHSABS 2.0 09/23/2013 0443   MONOABS 0.5 12/19/2013 1158   MONOABS 0.4 09/23/2013 0443   EOSABS 0.2 10/05/2017 1218   EOSABS 0.1 09/23/2013 0443   BASOSABS 0.0 10/05/2017 1218   BASOSABS 0.1 09/23/2013 0443   Iron/TIBC/Ferritin/ %Sat No results found for: IRON, TIBC, FERRITIN, IRONPCTSAT Lipid Panel     Component Value Date/Time   CHOL 171 10/05/2017 1218   CHOL 183 09/20/2013 0424   TRIG 97 10/05/2017 1218   TRIG 105 09/20/2013 0424   HDL 57 10/05/2017 1218   HDL 40 09/20/2013 0424   CHOLHDL 3.3 07/21/2017 0854   VLDL 18 07/21/2017 0854   VLDL 21 09/20/2013 0424   LDLCALC 95 10/05/2017 1218   LDLCALC 122 (H) 09/20/2013 0424   Hepatic Function Panel     Component Value Date/Time   PROT 7.5 07/21/2017 0854   PROT 7.7 09/19/2013 0431    ALBUMIN 3.5 07/21/2017 0854   ALBUMIN 3.5 09/19/2013 0431   AST 17 07/21/2017 0854   AST 36 09/19/2013 0431   ALT 13 (L) 07/21/2017 0854   ALT 32 09/19/2013 0431   ALKPHOS 73 07/21/2017 0854   ALKPHOS 65 09/19/2013 0431   BILITOT 0.6 07/21/2017 0854   BILITOT 0.4 09/19/2013 0431   BILIDIR <0.1 (L) 07/21/2017 0854   IBILI NOT CALCULATED 07/21/2017 0854      Component Value Date/Time   TSH 3.680 10/05/2017 1218   TSH 5.995 (H) 11/14/2016 1354   TSH 7.068 (H) 11/10/2016 1040   TSH 2.90 02/29/2008 1132  Results for Miranda, Clark (MRN [id number]) as of 11/25/2017 09:31  Ref. Range 10/05/2017 12:18  Vitamin D, 25-Hydroxy Latest Ref Range: 30.0 - 100.0 ng/mL 6.2 (L)    ASSESSMENT AND PLAN: Vitamin D deficiency - Plan: Vitamin D, Ergocalciferol, (DRISDOL) 50000 units CAPS capsule  Insulin resistance  At risk for osteoporosis  Class 3 severe obesity with serious comorbidity and body mass index (BMI) of 45.0 to 49.9 in adult, unspecified obesity type ([place])  PLAN:  Vitamin D Deficiency [name] was informed that low vitamin D levels contributes to fatigue and are associated with obesity, breast, and colon cancer. [name] agrees to continue taking prescription Vit D @50 ,000 IU every week #4 and we will refill for 1 month. She will follow up for routine testing of vitamin D, at least 2-3 times per year. She was informed of the risk of over-replacement of vitamin D and agrees to not increase her dose unless she discusses this with [name] first. [name] agrees to follow up with our clinic in 2 weeks.  At risk for osteopenia and osteoporosis [name] is at risk for osteopenia and osteoporsis due to her vitamin D deficiency. She was encouraged to take her vitamin D and follow her higher calcium diet and increase strengthening exercise to help strengthen her bones and decrease her risk of osteopenia and osteoporosis.  Insulin Resistance [name]  will continue to work on weight loss, exercise, and decreasing  simple carbohydrates in her diet to help decrease the risk of diabetes. We dicussed metformin including benefits and risks. She was informed that eating too many simple carbohydrates or too many calories at one sitting increases the likelihood of GI side effects. [name] agrees to continue taking metformin and she agrees to follow up with our clinic in 2 weeks as directed to monitor her progress.  Obesity [name] is currently in the action stage of change. As such, her goal is to continue with weight loss efforts She has agreed to keep a food journal with 450-600 calories and 40+ grams of protein at supper daily and follow the Category 3 plan [name] has been instructed to work up to a goal of 150 minutes of combined cardio and strengthening exercise per week for weight loss and overall health benefits. Resistance training to start at next visit. We discussed the following Behavioral Modification Strategies today: increasing lean protein intake, increasing vegetables, work on meal planning and easy cooking plans, planning for success, and keep a strict food journal   [name] has agreed to follow up with our clinic in 2 weeks. She was informed of the importance of frequent follow up visits to maximize her success with intensive lifestyle modifications for her multiple health conditions.   OBESITY BEHAVIORAL INTERVENTION VISIT  Today's visit was # 4 out of 22.  Starting weight: 277 lbs Starting date: 10/05/17 Today's weight : 266 lbs  Today's date: 11/25/2017 Total lbs lost to date: 11 (Patients must lose 7 lbs in the first 6 months to continue with counseling)   ASK: We discussed the diagnosis of obesity with [name] today and [name] agreed to give [name] permission to discuss obesity behavioral modification therapy today.  ASSESS: [name] has the diagnosis of obesity and her BMI today is 47.13 [name] is in the action stage of change   ADVISE: [name] was educated on the multiple health risks of  obesity as well as the benefit of weight loss to improve her health. She was advised of the need for long term treatment and the importance of lifestyle modifications.  AGREE: Multiple dietary modification options and treatment options were discussed and  [name] agreed to the above obesity treatment plan.  I, Trixie Dredge, am acting as transcriptionist for Ilene Qua, MD  I have reviewed the above documentation for accuracy and completeness, and I agree with the above. - Ilene Qua, MD

## 2017-11-27 ENCOUNTER — Encounter (HOSPITAL_COMMUNITY): Payer: Self-pay | Admitting: Cardiology

## 2017-11-27 ENCOUNTER — Ambulatory Visit (HOSPITAL_COMMUNITY)
Admission: RE | Admit: 2017-11-27 | Discharge: 2017-11-27 | Disposition: A | Discharge status: Home / Self Care | Payer: BLUE CROSS/BLUE SHIELD | Source: Ambulatory Visit | Attending: Cardiology | Admitting: Cardiology

## 2017-11-27 VITALS — BP 100/52 | HR 81 | Wt 271.1 lb

## 2017-11-27 DIAGNOSIS — Z09 Encounter for follow-up examination after completed treatment for conditions other than malignant neoplasm: Secondary | ICD-10-CM | POA: Insufficient documentation

## 2017-11-27 DIAGNOSIS — I252 Old myocardial infarction: Secondary | ICD-10-CM | POA: Insufficient documentation

## 2017-11-27 DIAGNOSIS — I110 Hypertensive heart disease with heart failure: Secondary | ICD-10-CM | POA: Insufficient documentation

## 2017-11-27 DIAGNOSIS — Z8719 Personal history of other diseases of the digestive system: Secondary | ICD-10-CM | POA: Insufficient documentation

## 2017-11-27 DIAGNOSIS — E039 Hypothyroidism, unspecified: Secondary | ICD-10-CM | POA: Insufficient documentation

## 2017-11-27 DIAGNOSIS — Z95810 Presence of automatic (implantable) cardiac defibrillator: Secondary | ICD-10-CM | POA: Insufficient documentation

## 2017-11-27 DIAGNOSIS — Z8249 Family history of ischemic heart disease and other diseases of the circulatory system: Secondary | ICD-10-CM | POA: Diagnosis not present

## 2017-11-27 DIAGNOSIS — I272 Pulmonary hypertension, unspecified: Secondary | ICD-10-CM | POA: Diagnosis not present

## 2017-11-27 DIAGNOSIS — Z6841 Body Mass Index (BMI) 40.0 and over, adult: Secondary | ICD-10-CM | POA: Insufficient documentation

## 2017-11-27 DIAGNOSIS — E785 Hyperlipidemia, unspecified: Secondary | ICD-10-CM | POA: Insufficient documentation

## 2017-11-27 DIAGNOSIS — Z7984 Long term (current) use of oral hypoglycemic drugs: Secondary | ICD-10-CM | POA: Diagnosis not present

## 2017-11-27 DIAGNOSIS — Z79899 Other long term (current) drug therapy: Secondary | ICD-10-CM | POA: Insufficient documentation

## 2017-11-27 DIAGNOSIS — I5022 Chronic systolic (congestive) heart failure: Secondary | ICD-10-CM | POA: Diagnosis not present

## 2017-11-27 DIAGNOSIS — I429 Cardiomyopathy, unspecified: Secondary | ICD-10-CM | POA: Diagnosis not present

## 2017-11-27 LAB — BASIC METABOLIC PANEL
Anion gap: 7 (ref 5–15)
BUN: 13 mg/dL (ref 6–20)
CO2: 25 mmol/L (ref 22–32)
Calcium: 9.5 mg/dL (ref 8.9–10.3)
Chloride: 107 mmol/L (ref 101–111)
Creatinine, Ser: 0.92 mg/dL (ref 0.44–1.00)
GFR calc Af Amer: >60 mL/min (ref >60)
GFR calc non Af Amer: >60 mL/min (ref >60)
Glucose, Bld: 88 mg/dL (ref 65–99)
Potassium: 3.9 mmol/L (ref 3.5–5.1)
Sodium: 139 mmol/L (ref 135–145)

## 2017-11-27 LAB — DIGOXIN LEVEL: Digoxin Level: 0.6 ng/mL — ABNORMAL LOW (ref 0.8–2.0)

## 2017-11-27 NOTE — Patient Instructions (Signed)
Your physician has recommended that you have a cardiopulmonary stress test (CPX). CPX testing is a non-invasive measurement of heart and lung function. It replaces a traditional treadmill stress test. This type of test provides a tremendous amount of information that relates not only to your present condition but also for future outcomes. This test combines measurements of you ventilation, respiratory gas exchange in the lungs, electrocardiogram (EKG), blood pressure and physical response before, during, and following an exercise protocol.  Labs drawn today (if we do not call you, then your lab work was stable)   Your physician recommends that you schedule a follow-up appointment in: 4 months with Dr. [name]  Please Call an Schedule Appointment (call in August)

## 2017-11-29 NOTE — Progress Notes (Signed)
Patient ID: [name], female   DOB: Aug 17, 1970, 47 y.o.   MRN: [id number]  PCP: [facility] in [place] Card[facility]: Dr. [name]  HPI: [name] is a 47 y.o. 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  She was followed for systolic CHF initially in [place]. Apparently her EF recovered to 35-40%. However, she was admitted to [facility] 09/19/13 for worsening dyspnea and CP. Coronaries were reportedly OK on LHC at that time at [facility]. Echo showed EF of 15-20% with four-chamber enlargement with moderate-severe TR/MR. RHC as below. Rheumatological w/u negative.  CPX in 9/16 showed near-normal functional capacity.  Repeat echo in 6/17 shows that EF remains 20%. Repeat CPX in 1/18 was submaximal but probably only mild circulatory limitation.  Echo 8/18 showed EF 25-30%, severe LV dilation.    She returns today for followup of heart failure.  She is being seen in the [facility] weight management clinic.  Her weight is down 10 lbs. She feels good overall.  Some dyspnea walking up a flight of stairs but no problems walking on flat ground.  Overall, breathing is better with weight loss.  She has chronic orthopnea, no PND.  No chest pain.  No lightheadedness or syncope.   ECG (4/19, personally reviewed): NSR, LVH QRS 124 msec  RHC Procedural Findings: RA mean 5 RV 55/8 PA 56/31, mean 40 PCWP mean 24 Oxygen saturations: PA 61% AO 99% Cardiac Output (Fick) 4.3  Cardiac Index (Fick) 2.0 PVR 3.7 WU  RHC 09/24/13  RA 14  RV 57/22  PA 64/36 (48)  PCWP 18  Fick CO/CI: 3.7/1.6  PVR 8.1 WU  Ao sat 95%  PA sat 47%   CPX 06/27/2016  Peak VO2: 13.5 (56.2% predicted peak VO2) VE/VCO2 slope: 33 OUES: 2.16 Peak RER: 0.98  CPX  1/18 Peak VO2 13.5 VE/VCO2 slope 33 RER 0.98 Submaximal study, mild circulatory limitation, suspect more limited by body habitus.   Echo (8/15) with EF 20%, moderate LV  dilation, diffuse hypokinesis, normal RV size with mildly decreased systolic function, mild MR.  Echo (5/16) with EF 20%, spherical LV with diffuse hypokinesis, mild MR.  Echo (6/17) with EF 20%, diffuse hypokinesis, moderate LV dilation, normal RV, mild to moderate MR.  Echo (8/18) with EF 25-30%, severe LV dilation, severe LAE.   Sleep study did not show significant OSA.   RHC (8/15): Mean RA 3 PA 33/11 mean 19 Mean PCWP 8 CI 2.35  Holter (8/15) with < 1% PVCs  CPX 9/16 Peak VO2 16.2 VE/VCO2 slope 23.6 RER 1.25 Normal functional capacity  Labs: (11/09/13): Dig level 0.5, K 3.6, Creatinine 0.83, pro-BNP 622 (12/19/13): K 3.6, creatinine 0.8 (7/15): K 4.1, creatinine 0.91 (8/15): K 3.9, creatinine 0.91 (10/15): K 4, creatinine 0.93, proBNP 129 (12/15): K 4, creatinine 0.9, digoxin 0.4 (5/16): K 4, creatinine 0.86 (9/16): K 3.6, creatinine 0.86, digoxin < 0.2 (5/17): K 4.1, creatinine 0.95, digoxin 0.3 (5/18): K 3.4, creatinine 0.95, TSH elevated but free T3 and free T4 normal.  (8/18): K 3.9, creatinine 0.87, BNP 108  (11/18): K 3.5, creatinine 1.04, digoxin 0.4 (1/19): LDL 91, HDL 47 (3/19): digoxin 0.7, K 4, creatinine 0.99 (4/19): TSH normal, LDL 95  SH: Disabled since 2014. Lives her husband and 2 children. Does not drink alcohol or smoke   FH: Grandmother had heart failure   ROS: All systems negative except as listed in HPI, PMH and  Problem List.  Past Medical History:  Diagnosis Date  . Automatic implantable cardioverter-defibrillator in situ   . Chronic CHF (congestive heart failure) (HCC)    a. EF 15-20% b. RHC (09/2013) RA 14, RV 57/22, PA 64/36 (48), PCWP 18, FIck CO/CI 3.7/1.6, PVR 8.1 WU, PA sat 47%   . History of stomach ulcers   . Hypertension   . Hypotension   . Hypothyroidism   . Morbid obesity ([place])   . Myocardial infarction ([place]) 08/2013  . Nocturnal dyspnea   . Nonischemic cardiomyopathy ([place])   . Sinus tachycardia   . Snoring-prob OSA  09/04/2011  . Sprint Fidelis ICD lead RECALL  [phone]     Current Outpatient Medications  Medication Sig Dispense Refill  . atorvastatin (LIPITOR) 40 MG tablet TAKE ONE TABLET BY MOUTH DAILY 30 tablet 3  . carvedilol (COREG) 25 MG tablet Take 1 tablet (25 mg total) by mouth 2 (two) times daily with a meal. 60 tablet 6  . digoxin (LANOXIN) 0.125 MG tablet TAKE 1 TABLET BY MOUTH ONCE DAILY 30 tablet 6  . ENTRESTO 24-26 MG TAKE 1 TABLET BY MOUTH TWICE DAILY 60 tablet 6  . furosemide (LASIX) 40 MG tablet TAKE 2 TABLETS BY MOUTH (8OMG) IN THE MORNING AND TAKE 1 TABLET (40MG ) IN THE EVENING AS INSTRUCTED 90 tablet 3  . ivabradine (CORLANOR) 5 MG TABS tablet Take 1 tablet (5 mg total) by mouth 2 (two) times daily with a meal. 60 tablet 6  . metFORMIN (GLUCOPHAGE) 500 MG tablet Take 1 tablet (500 mg total) by mouth daily with breakfast. 30 tablet 0  . potassium chloride SA (K-DUR,KLOR-CON) 20 MEQ tablet TAKE 1 TABLET BY MOUTH ONCE DAILY 60 tablet 6  . spironolactone (ALDACTONE) 25 MG tablet TAKE 1 TABLET BY MOUTH ONCE DAILY 30 tablet 6  . Vitamin D, Ergocalciferol, (DRISDOL) 50000 units CAPS capsule Take 1 capsule (50,000 Units total) by mouth every 7 (seven) days. 4 capsule 0   No current facility-administered medications for this encounter.    Vitals:   11/27/17 1141  BP: (!) 100/52  Pulse: 81  SpO2: 97%  Weight: 271 lb 1.9 oz (123 kg)    Wt Readings from Last 3 Encounters:  11/27/17 271 lb 1.9 oz (123 kg)  11/25/17 266 lb (120.7 kg)  11/03/17 270 lb (122.5 kg)     PHYSICAL EXAM: General: NAD Neck: No JVD, no thyromegaly or thyroid nodule.  Lungs: Clear to auscultation bilaterally with normal respiratory effort. CV: Nondisplaced PMI.  Heart regular S1/S2, no S3/S4, no murmur.  No peripheral edema.  No carotid bruit.  Normal pedal pulses.  Abdomen: Soft, nontender, no hepatosplenomegaly, no distention.  Skin: Intact without lesions or rashes.  Neurologic: Alert and oriented x 3.  Psych:  Normal affect. Extremities: No clubbing or cyanosis.  HEENT: Normal.   ASSESSMENT & PLAN: 1. Chronic systolic HF: Nonischemic cardiomyopathy.  She has a Medtronic ICD.  Cause for cardiomyopathy is uncertain: initially noted peri-partum (after son's birth), so cannot rule out peri-partum CMP.  She was noted at one point to have frequent PVCs but holter showed <1% PVCs.   RHC in 8/15 showed normal filling pressures and preserved cardiac output.  She does not qualify for CRT with narrow QRS.  CPX 06/2016 with submaximal effort, limitation mostly from obesity with mild circulatory limitation.  Last echo in 8/18 showed EF 25-30% with severe LV dilation. She is not volume overloaded by exam.  NYHA class II symptoms. - Continue Lasix  80 qam/40 qpm.  BMET today. - Continue Entresto 24/26mg  BID, no BP room to increase.    - Continue Coreg 25 mg bid and ivabradine 5 mg bid.   - Continue current spironolactone and digoxin, check digoxin level.  - I will arrange for a CPX.  2. Pulmonary HTN: Mixed picture with PVR 8.1 on RHC at [facility].  However, repeat study in 8/15 here showed no significant pulmonary hypertension.  3. Morbid obesity: Body mass index is 48.03 kg/m.  She is now in the [employer] management program and her weight is coming down.  4. Hyperlipidemia: Good lipids in 1/19.    Followup in 4 months.  [name] 11/29/2017

## 2017-12-10 ENCOUNTER — Ambulatory Visit (INDEPENDENT_AMBULATORY_CARE_PROVIDER_SITE_OTHER): Payer: BLUE CROSS/BLUE SHIELD | Admitting: Family Medicine

## 2017-12-10 VITALS — BP 103/74 | HR 85 | Temp 98.4°F | Ht 63.0 in | Wt 267.0 lb

## 2017-12-10 DIAGNOSIS — E559 Vitamin D deficiency, unspecified: Secondary | ICD-10-CM

## 2017-12-10 DIAGNOSIS — Z9189 Other specified personal risk factors, not elsewhere classified: Secondary | ICD-10-CM | POA: Diagnosis not present

## 2017-12-10 DIAGNOSIS — E8881 Metabolic syndrome: Secondary | ICD-10-CM | POA: Diagnosis not present

## 2017-12-10 DIAGNOSIS — Z6841 Body Mass Index (BMI) 40.0 and over, adult: Secondary | ICD-10-CM

## 2017-12-10 MED ORDER — METFORMIN HCL 500 MG PO TABS: 500.0000 mg | ORAL_TABLET | Freq: Every day | ORAL | 0 refills | Status: DC

## 2017-12-10 MED ORDER — VITAMIN D (ERGOCALCIFEROL) 1.25 MG (50000 UNIT) PO CAPS: 50000.0000 [IU] | ORAL_CAPSULE | ORAL | 0 refills | Status: DC

## 2017-12-10 NOTE — Progress Notes (Signed)
Office: [phone]  /  Fax: [phone]   HPI:   Chief Complaint: OBESITY [name] is here to discuss her progress with her obesity treatment plan. She is on the keep a food journal with 450 to 600 calories and 40+ grams of protein at supper daily and the Category 3 plan and is following her eating plan approximately 90 % of the time. She states she is walking 10 to 15 minutes 4 days per week. [name] is getting more creative with food and meal plan. She has started an increase in walking for about 4 days per week. Her weight is 267 lb (121.1 kg) today and has had a weight gain of 1 pound over a period of 2 weeks since her last visit. She has lost 10 lbs since starting treatment with [name].  Vitamin D deficiency [name] has a diagnosis of vitamin D deficiency. She is currently taking vit D. [name] admits fatigue and denies nausea, vomiting or muscle weakness.  Insulin Resistance [name] has a diagnosis of insulin resistance based on her elevated fasting insulin level >5. Although [name]'s blood glucose readings are still under good control, insulin resistance puts her at greater risk of metabolic syndrome and diabetes. She is taking metformin currently and continues to work on diet and exercise to decrease risk of diabetes. [name] has better control of carb cravings and she denies hypoglycemia.  At risk for diabetes [name] is at higher than average risk for developing diabetes due to her obesity and insulin resistance. She currently denies polyuria or polydipsia.  ALLERGIES: No Known Allergies  MEDICATIONS: Current Outpatient Medications on File Prior to Visit  Medication Sig Dispense Refill  . atorvastatin (LIPITOR) 40 MG tablet TAKE ONE TABLET BY MOUTH DAILY 30 tablet 3  . carvedilol (COREG) 25 MG tablet Take 1 tablet (25 mg total) by mouth 2 (two) times daily with a meal. 60 tablet 6  . digoxin (LANOXIN) 0.125 MG tablet TAKE 1 TABLET BY MOUTH ONCE DAILY 30 tablet 6  . ENTRESTO 24-26 MG TAKE 1  TABLET BY MOUTH TWICE DAILY 60 tablet 6  . furosemide (LASIX) 40 MG tablet TAKE 2 TABLETS BY MOUTH (8OMG) IN THE MORNING AND TAKE 1 TABLET (40MG ) IN THE EVENING AS INSTRUCTED 90 tablet 3  . ivabradine (CORLANOR) 5 MG TABS tablet Take 1 tablet (5 mg total) by mouth 2 (two) times daily with a meal. 60 tablet 6  . metFORMIN (GLUCOPHAGE) 500 MG tablet Take 1 tablet (500 mg total) by mouth daily with breakfast. 30 tablet 0  . potassium chloride SA (K-DUR,KLOR-CON) 20 MEQ tablet TAKE 1 TABLET BY MOUTH ONCE DAILY 60 tablet 6  . spironolactone (ALDACTONE) 25 MG tablet TAKE 1 TABLET BY MOUTH ONCE DAILY 30 tablet 6  . Vitamin D, Ergocalciferol, (DRISDOL) 50000 units CAPS capsule Take 1 capsule (50,000 Units total) by mouth every 7 (seven) days. 4 capsule 0   No current facility-administered medications on file prior to visit.     PAST MEDICAL HISTORY: Past Medical History:  Diagnosis Date  . Automatic implantable cardioverter-defibrillator in situ   . Chronic CHF (congestive heart failure) (HCC)    a. EF 15-20% b. RHC (09/2013) RA 14, RV 57/22, PA 64/36 (48), PCWP 18, FIck CO/CI 3.7/1.6, PVR 8.1 WU, PA sat 47%   . History of stomach ulcers   . Hypertension   . Hypotension   . Hypothyroidism   . Morbid obesity ([place])   . Myocardial infarction ([place]) 08/2013  . Nocturnal dyspnea   .  Nonischemic cardiomyopathy ([place])   . Sinus tachycardia   . Snoring-prob OSA 09/04/2011  . Sprint Fidelis ICD lead RECALL  [id number]     PAST SURGICAL HISTORY: Past Surgical History:  Procedure Laterality Date  . BREATH TEK H PYLORI N/A 11/09/2014   Procedure: BREATH TEK H PYLORI;  Surgeon: Greer Pickerel, MD;  Location: [facility];  Service: General;  Laterality: N/A;  . CARDIAC CATHETERIZATION  ~ 2006; 09/2013  . CARDIAC CATHETERIZATION N/A 05/23/2016   Procedure: Right Heart Cath;  Surgeon: Larey Dresser, MD;  Location: [place] [facility];  Service: Cardiovascular;  Laterality: N/A;  . CARDIAC DEFIBRILLATOR PLACEMENT   2006; 12/26/2013   Medtronic [id number]; 12-2013 ICD gen change and RV lead revision with new 6195 RV lead by Dr [name]  . CESAREAN SECTION  1999  . IMPLANTABLE CARDIOVERTER DEFIBRILLATOR GENERATOR CHANGE N/A 12/26/2013   Procedure: IMPLANTABLE CARDIOVERTER DEFIBRILLATOR GENERATOR CHANGE;  Surgeon: Deboraha Sprang, MD;  Location: [facility] CATH LAB;  Service: Cardiovascular;  Laterality: N/A;  . LEAD REVISION N/A 12/26/2013   Procedure: LEAD REVISION;  Surgeon: Deboraha Sprang, MD;  Location: [facility] CATH LAB;  Service: Cardiovascular;  Laterality: N/A;  . RIGHT HEART CATHETERIZATION N/A 02/15/2014   Procedure: RIGHT HEART CATH;  Surgeon: Larey Dresser, MD;  Location: [facility] CATH LAB;  Service: Cardiovascular;  Laterality: N/A;  . TUBAL LIGATION  1999    SOCIAL HISTORY: Social History   Tobacco Use  . Smoking status: Never Smoker  . Smokeless tobacco: Never Used  Substance Use Topics  . Alcohol use: No  . Drug use: No    FAMILY HISTORY: Family History  Problem Relation Age of Onset  . Heart disease Maternal Grandmother   . Heart disease Mother   . Obesity Mother   . High blood pressure Father   . Stroke Father     ROS: Review of Systems  Constitutional: Positive for malaise/fatigue. Negative for weight loss.  Gastrointestinal: Negative for nausea and vomiting.  Genitourinary: Negative for frequency.  Musculoskeletal:       Negative for muscle weakness  Endo/Heme/Allergies: Negative for polydipsia.       Positive for carb cravings Negative for hypoglycemia    PHYSICAL EXAM: Blood pressure 103/74, pulse 85, temperature 98.4 F (36.9 C), temperature source Oral, height 5\' 3"  (1.6 m), weight 267 lb (121.1 kg), SpO2 98 %. Body mass index is 47.3 kg/m. Physical Exam  Constitutional: She is oriented to person, place, and time. She appears well-developed and well-nourished.  Cardiovascular: Normal rate.  Pulmonary/Chest: Effort normal.  Musculoskeletal: Normal range of motion.    Neurological: She is oriented to person, place, and time.  Skin: Skin is warm and dry.  Psychiatric: She has a normal mood and affect. Her behavior is normal.  Vitals reviewed.   RECENT LABS AND TESTS: BMET    Component Value Date/Time   NA 139 11/27/2017 1220   NA 138 09/24/2013 0536   K 3.9 11/27/2017 1220   K 3.7 09/24/2013 0536   CL 107 11/27/2017 1220   CL 107 09/24/2013 0536   CO2 25 11/27/2017 1220   CO2 25 09/24/2013 0536   GLUCOSE 88 11/27/2017 1220   GLUCOSE 82 09/24/2013 0536   BUN 13 11/27/2017 1220   BUN 11 09/24/2013 0536   CREATININE 0.92 11/27/2017 1220   CREATININE 1.08 09/24/2013 0536   CALCIUM 9.5 11/27/2017 1220   CALCIUM 8.6 09/24/2013 0536   GFRNONAA >60 11/27/2017 1220   GFRNONAA >60 09/24/2013 0536  GFRAA >60 11/27/2017 1220   GFRAA >60 09/24/2013 0536   Lab Results  Component Value Date   HGBA1C 5.6 10/05/2017   HGBA1C 5.9 09/19/2013   Lab Results  Component Value Date   INSULIN 22.5 10/05/2017   CBC    Component Value Date/Time   WBC 8.9 10/05/2017 1218   WBC 10.6 (H) 05/20/2016 1515   RBC 4.84 10/05/2017 1218   RBC 4.64 05/20/2016 1515   HGB 13.6 10/05/2017 1218   HCT 41.7 10/05/2017 1218   PLT 269 05/20/2016 1515   PLT 195 09/23/2013 0443   MCV 86 10/05/2017 1218   MCV 85 09/23/2013 0443   MCH 28.1 10/05/2017 1218   MCH 28.2 05/20/2016 1515   MCHC 32.6 10/05/2017 1218   MCHC 32.8 05/20/2016 1515   RDW 14.5 10/05/2017 1218   RDW 15.4 (H) 09/23/2013 0443   LYMPHSABS 1.9 10/05/2017 1218   LYMPHSABS 2.0 09/23/2013 0443   MONOABS 0.5 12/19/2013 1158   MONOABS 0.4 09/23/2013 0443   EOSABS 0.2 10/05/2017 1218   EOSABS 0.1 09/23/2013 0443   BASOSABS 0.0 10/05/2017 1218   BASOSABS 0.1 09/23/2013 0443   Iron/TIBC/Ferritin/ %Sat No results found for: IRON, TIBC, FERRITIN, IRONPCTSAT Lipid Panel     Component Value Date/Time   CHOL 171 10/05/2017 1218   CHOL 183 09/20/2013 0424   TRIG 97 10/05/2017 1218   TRIG 105  09/20/2013 0424   HDL 57 10/05/2017 1218   HDL 40 09/20/2013 0424   CHOLHDL 3.3 07/21/2017 0854   VLDL 18 07/21/2017 0854   VLDL 21 09/20/2013 0424   LDLCALC 95 10/05/2017 1218   LDLCALC 122 (H) 09/20/2013 0424   Hepatic Function Panel     Component Value Date/Time   PROT 7.5 07/21/2017 0854   PROT 7.7 09/19/2013 0431   ALBUMIN 3.5 07/21/2017 0854   ALBUMIN 3.5 09/19/2013 0431   AST 17 07/21/2017 0854   AST 36 09/19/2013 0431   ALT 13 (L) 07/21/2017 0854   ALT 32 09/19/2013 0431   ALKPHOS 73 07/21/2017 0854   ALKPHOS 65 09/19/2013 0431   BILITOT 0.6 07/21/2017 0854   BILITOT 0.4 09/19/2013 0431   BILIDIR <0.1 (L) 07/21/2017 0854   IBILI NOT CALCULATED 07/21/2017 0854      Component Value Date/Time   TSH 3.680 10/05/2017 1218   TSH 5.995 (H) 11/14/2016 1354   TSH 7.068 (H) 11/10/2016 1040   TSH 2.90 02/29/2008 1132   Results for OUITA, NISH (MRN [id number]) as of 12/10/2017 14:04  Ref. Range 10/05/2017 12:18  Vitamin D, 25-Hydroxy Latest Ref Range: 30.0 - 100.0 ng/mL 6.2 (L)   ASSESSMENT AND PLAN: Vitamin D deficiency - Plan: Vitamin D, Ergocalciferol, (DRISDOL) 50000 units CAPS capsule  Insulin resistance - Plan: metFORMIN (GLUCOPHAGE) 500 MG tablet  At risk for diabetes mellitus  Class 3 severe obesity with serious comorbidity and body mass index (BMI) of 45.0 to 49.9 in adult, unspecified obesity type ([place])  PLAN:  Vitamin D Deficiency [name] was informed that low vitamin D levels contributes to fatigue and are associated with obesity, breast, and colon cancer. She agrees to continue to take prescription Vit D @50 ,000 IU every week #4 with no refills and will follow up for routine testing of vitamin D, at least 2-3 times per year. She was informed of the risk of over-replacement of vitamin D and agrees to not increase her dose unless she discusses this with [name] first. [name] agrees to follow up as directed.  Insulin Resistance  [name] will continue to work on weight  loss, exercise, and decreasing simple carbohydrates in her diet to help decrease the risk of diabetes. We dicussed metformin including benefits and risks. She was informed that eating too many simple carbohydrates or too many calories at one sitting increases the likelihood of GI side effects. [name] requested metformin for now and prescription was written today for 1 month refill. [name] agreed to follow up with [name] as directed to monitor her progress.  Diabetes risk counseling [name] was given extended (15 minutes) diabetes prevention counseling today. She is 47 y.o. female and has risk factors for diabetes including obesity and insulin resistance. We discussed intensive lifestyle modifications today with an emphasis on weight loss as well as increasing exercise and decreasing simple carbohydrates in her diet.  Obesity [name] is currently in the action stage of change. As such, her goal is to continue with weight loss efforts She has agreed to keep a food journal with 450 to 600 calories and 40+ grams of protein at supper daily and follow the Category 3 plan +100 calories [name] has been instructed to work up to a goal of 150 minutes of combined cardio and strengthening exercise per week for weight loss and overall health benefits. We discussed the following Behavioral Modification Strategies today: planning for success, increasing lean protein intake, increasing vegetables and work on meal planning and easy cooking plans  [name] has agreed to follow up with our clinic in 2 weeks. She was informed of the importance of frequent follow up visits to maximize her success with intensive lifestyle modifications for her multiple health conditions.   OBESITY BEHAVIORAL INTERVENTION VISIT  Today's visit was # 5 out of 22.  Starting weight: 277 lbs Starting date: 10/05/17 Today's weight : 267 lbs Today's date: 12/10/2017 Total lbs lost to date: 10 (Patients must lose 7 lbs in the first 6 months to continue  with counseling)   ASK: We discussed the diagnosis of obesity with [name] today and [name] agreed to give [name] permission to discuss obesity behavioral modification therapy today.  ASSESS: [name] has the diagnosis of obesity and her BMI today is 47.31 Thalia is in the action stage of change   ADVISE: Catelyn was educated on the multiple health risks of obesity as well as the benefit of weight loss to improve her health. She was advised of the need for long term treatment and the importance of lifestyle modifications.  AGREE: Multiple dietary modification options and treatment options were discussed and  Mis[place] agreed to the above obesity treatment plan.  I, Doreene Nest, am acting as transcriptionist for Eber Jones, MD  I have reviewed the above documentation for accuracy and completeness, and I agree with the above. - Ilene Qua, MD

## 2017-12-16 ENCOUNTER — Other Ambulatory Visit (HOSPITAL_COMMUNITY): Payer: Self-pay | Admitting: *Deleted

## 2017-12-16 ENCOUNTER — Ambulatory Visit (HOSPITAL_COMMUNITY): Payer: BLUE CROSS/BLUE SHIELD | Attending: Internal Medicine

## 2017-12-16 DIAGNOSIS — I272 Pulmonary hypertension, unspecified: Secondary | ICD-10-CM | POA: Insufficient documentation

## 2017-12-16 DIAGNOSIS — Z6841 Body Mass Index (BMI) 40.0 and over, adult: Secondary | ICD-10-CM | POA: Diagnosis not present

## 2017-12-16 DIAGNOSIS — I5022 Chronic systolic (congestive) heart failure: Secondary | ICD-10-CM

## 2017-12-16 DIAGNOSIS — E785 Hyperlipidemia, unspecified: Secondary | ICD-10-CM | POA: Insufficient documentation

## 2017-12-31 ENCOUNTER — Ambulatory Visit (INDEPENDENT_AMBULATORY_CARE_PROVIDER_SITE_OTHER): Payer: BLUE CROSS/BLUE SHIELD | Admitting: Physician Assistant

## 2017-12-31 ENCOUNTER — Other Ambulatory Visit (INDEPENDENT_AMBULATORY_CARE_PROVIDER_SITE_OTHER): Payer: Self-pay | Admitting: Family Medicine

## 2017-12-31 VITALS — BP 92/68 | HR 78 | Temp 98.4°F | Ht 63.0 in | Wt 265.0 lb

## 2017-12-31 DIAGNOSIS — E66813 Obesity, class 3: Secondary | ICD-10-CM

## 2017-12-31 DIAGNOSIS — E8881 Metabolic syndrome: Secondary | ICD-10-CM

## 2017-12-31 DIAGNOSIS — Z6841 Body Mass Index (BMI) 40.0 and over, adult: Secondary | ICD-10-CM

## 2017-12-31 DIAGNOSIS — I10 Essential (primary) hypertension: Secondary | ICD-10-CM

## 2017-12-31 DIAGNOSIS — E559 Vitamin D deficiency, unspecified: Secondary | ICD-10-CM

## 2017-12-31 NOTE — Progress Notes (Signed)
Office: [phone]  /  Fax: [phone]   HPI:   Chief Complaint: OBESITY [name] is here to discuss her progress with her obesity treatment plan. She is on the keep a food journal with 450-600 calories and 40+ grams of protein at supper daily and follow the Category 3 plan + 100 calories and is following her eating plan approximately 90 % of the time. She states she is doing light exercising for 10-15 minutes 5 times per week. [name] continues to do well with weight loss. She is mindful of her eating and controls his portions. She would like more breakfast options.  Her weight is 265 lb (120.2 kg) today and has had a weight loss of 2 pounds over a period of 3 weeks since her last visit. She has lost 12 lbs since starting treatment with [name].  Hypertension [name] is a 47 y.o. female with hypertension. [name] has a history of chronic systolic heart failure, has a implantable cardioverter defibrillator. She follows up with Cardiology. States blood pressure is low but is closely monitored by Cardiology. She denies dizziness, lightheadedness, chest pain, or shortness of breath. She is working weight loss to help control her blood pressure with the goal of decreasing her risk of heart attack and stroke. [name] blood pressure is not currently controlled.  ALLERGIES: No Known Allergies  MEDICATIONS: Current Outpatient Medications on File Prior to Visit  Medication Sig Dispense Refill  . atorvastatin (LIPITOR) 40 MG tablet TAKE ONE TABLET BY MOUTH DAILY 30 tablet 3  . carvedilol (COREG) 25 MG tablet Take 1 tablet (25 mg total) by mouth 2 (two) times daily with a meal. 60 tablet 6  . digoxin (LANOXIN) 0.125 MG tablet TAKE 1 TABLET BY MOUTH ONCE DAILY 30 tablet 6  . ENTRESTO 24-26 MG TAKE 1 TABLET BY MOUTH TWICE DAILY 60 tablet 6  . furosemide (LASIX) 40 MG tablet TAKE 2 TABLETS BY MOUTH (8OMG) IN THE MORNING AND TAKE 1 TABLET (40MG ) IN THE EVENING AS INSTRUCTED 90 tablet 3  . ivabradine  (CORLANOR) 5 MG TABS tablet Take 1 tablet (5 mg total) by mouth 2 (two) times daily with a meal. 60 tablet 6  . metFORMIN (GLUCOPHAGE) 500 MG tablet Take 1 tablet (500 mg total) by mouth daily with breakfast. 30 tablet 0  . potassium chloride SA (K-DUR,KLOR-CON) 20 MEQ tablet TAKE 1 TABLET BY MOUTH ONCE DAILY 60 tablet 6  . spironolactone (ALDACTONE) 25 MG tablet TAKE 1 TABLET BY MOUTH ONCE DAILY 30 tablet 6  . Vitamin D, Ergocalciferol, (DRISDOL) 50000 units CAPS capsule Take 1 capsule (50,000 Units total) by mouth every 7 (seven) days. 4 capsule 0   No current facility-administered medications on file prior to visit.     PAST MEDICAL HISTORY: Past Medical History:  Diagnosis Date  . Automatic implantable cardioverter-defibrillator in situ   . Chronic CHF (congestive heart failure) (HCC)    a. EF 15-20% b. RHC (09/2013) RA 14, RV 57/22, PA 64/36 (48), PCWP 18, FIck CO/CI 3.7/1.6, PVR 8.1 WU, PA sat 47%   . History of stomach ulcers   . Hypertension   . Hypotension   . Hypothyroidism   . Morbid obesity ([place])   . Myocardial infarction ([place]) 08/2013  . Nocturnal dyspnea   . Nonischemic cardiomyopathy ([place])   . Sinus tachycardia   . Snoring-prob OSA 09/04/2011  . Sprint Fidelis ICD lead RECALL  [id number]     PAST SURGICAL HISTORY: Past Surgical History:  Procedure Laterality Date  .  BREATH TEK H PYLORI N/A 11/09/2014   Procedure: BREATH TEK H PYLORI;  Surgeon: Greer Pickerel, MD;  Location: [facility];  Service: General;  Laterality: N/A;  . CARDIAC CATHETERIZATION  ~ 2006; 09/2013  . CARDIAC CATHETERIZATION N/A 05/23/2016   Procedure: Right Heart Cath;  Surgeon: Larey Dresser, MD;  Location: [place] [facility];  Service: Cardiovascular;  Laterality: N/A;  . CARDIAC DEFIBRILLATOR PLACEMENT  2006; 12/26/2013   Medtronic [id number]; 12-2013 ICD gen change and RV lead revision with new 7829 RV lead by Dr [name]  . CESAREAN SECTION  1999  . IMPLANTABLE CARDIOVERTER DEFIBRILLATOR GENERATOR  CHANGE N/A 12/26/2013   Procedure: IMPLANTABLE CARDIOVERTER DEFIBRILLATOR GENERATOR CHANGE;  Surgeon: Deboraha Sprang, MD;  Location: [facility] CATH LAB;  Service: Cardiovascular;  Laterality: N/A;  . LEAD REVISION N/A 12/26/2013   Procedure: LEAD REVISION;  Surgeon: Deboraha Sprang, MD;  Location: [facility] CATH LAB;  Service: Cardiovascular;  Laterality: N/A;  . RIGHT HEART CATHETERIZATION N/A 02/15/2014   Procedure: RIGHT HEART CATH;  Surgeon: Larey Dresser, MD;  Location: [facility] CATH LAB;  Service: Cardiovascular;  Laterality: N/A;  . TUBAL LIGATION  1999    SOCIAL HISTORY: Social History   Tobacco Use  . Smoking status: Never Smoker  . Smokeless tobacco: Never Used  Substance Use Topics  . Alcohol use: No  . Drug use: No    FAMILY HISTORY: Family History  Problem Relation Age of Onset  . Heart disease Maternal Grandmother   . Heart disease Mother   . Obesity Mother   . High blood pressure Father   . Stroke Father     ROS: Review of Systems  Constitutional: Positive for weight loss.  Respiratory: Negative for shortness of breath.   Cardiovascular: Negative for chest pain.  Neurological: Negative for dizziness.       Negative lightheadedness    PHYSICAL EXAM: Blood pressure 92/68, pulse 78, temperature 98.4 F (36.9 C), temperature source Oral, height 5\' 3"  (1.6 m), weight 265 lb (120.2 kg), last menstrual period 12/20/2017, SpO2 97 %. Body mass index is 46.94 kg/m. Physical Exam  Constitutional: She is oriented to person, place, and time. She appears well-developed and well-nourished.  Cardiovascular: Normal rate.  Pulmonary/Chest: Effort normal.  Musculoskeletal: Normal range of motion.  Neurological: She is oriented to person, place, and time.  Skin: Skin is warm and dry.  Psychiatric: She has a normal mood and affect. Her behavior is normal.  Vitals reviewed.   RECENT LABS AND TESTS: BMET    Component Value Date/Time   NA 139 11/27/2017 1220   NA 138 09/24/2013 0536   K  3.9 11/27/2017 1220   K 3.7 09/24/2013 0536   CL 107 11/27/2017 1220   CL 107 09/24/2013 0536   CO2 25 11/27/2017 1220   CO2 25 09/24/2013 0536   GLUCOSE 88 11/27/2017 1220   GLUCOSE 82 09/24/2013 0536   BUN 13 11/27/2017 1220   BUN 11 09/24/2013 0536   CREATININE 0.92 11/27/2017 1220   CREATININE 1.08 09/24/2013 0536   CALCIUM 9.5 11/27/2017 1220   CALCIUM 8.6 09/24/2013 0536   GFRNONAA >60 11/27/2017 1220   GFRNONAA >60 09/24/2013 0536   GFRAA >60 11/27/2017 1220   GFRAA >60 09/24/2013 0536   Lab Results  Component Value Date   HGBA1C 5.6 10/05/2017   HGBA1C 5.9 09/19/2013   Lab Results  Component Value Date   INSULIN 22.5 10/05/2017   CBC    Component Value Date/Time  WBC 8.9 10/05/2017 1218   WBC 10.6 (H) 05/20/2016 1515   RBC 4.84 10/05/2017 1218   RBC 4.64 05/20/2016 1515   HGB 13.6 10/05/2017 1218   HCT 41.7 10/05/2017 1218   PLT 269 05/20/2016 1515   PLT 195 09/23/2013 0443   MCV 86 10/05/2017 1218   MCV 85 09/23/2013 0443   MCH 28.1 10/05/2017 1218   MCH 28.2 05/20/2016 1515   MCHC 32.6 10/05/2017 1218   MCHC 32.8 05/20/2016 1515   RDW 14.5 10/05/2017 1218   RDW 15.4 (H) 09/23/2013 0443   LYMPHSABS 1.9 10/05/2017 1218   LYMPHSABS 2.0 09/23/2013 0443   MONOABS 0.5 12/19/2013 1158   MONOABS 0.4 09/23/2013 0443   EOSABS 0.2 10/05/2017 1218   EOSABS 0.1 09/23/2013 0443   BASOSABS 0.0 10/05/2017 1218   BASOSABS 0.1 09/23/2013 0443   Iron/TIBC/Ferritin/ %Sat No results found for: IRON, TIBC, FERRITIN, IRONPCTSAT Lipid Panel     Component Value Date/Time   CHOL 171 10/05/2017 1218   CHOL 183 09/20/2013 0424   TRIG 97 10/05/2017 1218   TRIG 105 09/20/2013 0424   HDL 57 10/05/2017 1218   HDL 40 09/20/2013 0424   CHOLHDL 3.3 07/21/2017 0854   VLDL 18 07/21/2017 0854   VLDL 21 09/20/2013 0424   LDLCALC 95 10/05/2017 1218   LDLCALC 122 (H) 09/20/2013 0424   Hepatic Function Panel     Component Value Date/Time   PROT 7.5 07/21/2017 0854   PROT  7.7 09/19/2013 0431   ALBUMIN 3.5 07/21/2017 0854   ALBUMIN 3.5 09/19/2013 0431   AST 17 07/21/2017 0854   AST 36 09/19/2013 0431   ALT 13 (L) 07/21/2017 0854   ALT 32 09/19/2013 0431   ALKPHOS 73 07/21/2017 0854   ALKPHOS 65 09/19/2013 0431   BILITOT 0.6 07/21/2017 0854   BILITOT 0.4 09/19/2013 0431   BILIDIR <0.1 (L) 07/21/2017 0854   IBILI NOT CALCULATED 07/21/2017 0854      Component Value Date/Time   TSH 3.680 10/05/2017 1218   TSH 5.995 (H) 11/14/2016 1354   TSH 7.068 (H) 11/10/2016 1040   TSH 2.90 02/29/2008 1132    ASSESSMENT AND PLAN: No diagnosis found.  PLAN:  Hypertension We discussed sodium restriction, working on healthy weight loss, and a regular exercise program as the means to achieve improved blood pressure control. [name] agreed with this plan and agreed to follow up as directed. We will continue to monitor her blood pressure as well as her progress with the above lifestyle modifications. She will watch for signs of hypotension as she continues her lifestyle modifications. She will continue to follow up with Cardiology and [name] agrees to follow up with our clinic in 2 to 3 weeks.  We spent > than 50% of the 15 minute visit on the counseling as documented in the note.  Obesity [name] is currently in the action stage of change. As such, her goal is to continue with weight loss efforts She has agreed to follow the Category 3 plan [name] instructed to work up to a goal of 150 minutes of combined cardio and strengthening exercise per week for weight loss and overall health benefits. We discussed the following Behavioral Modification Strategies today: increasing lean protein intake and keeping healthy foods in the home   [name] has agreed to follow up with our clinic in 2 to 3 weeks. She was informed of the importance of frequent follow up visits to maximize her success with intensive lifestyle modifications for  her multiple health conditions.   OBESITY  BEHAVIORAL INTERVENTION VISIT  Today's visit was # 6 out of 22.  Starting weight: 277 lbs Starting date: 10/05/17 Today's weight : 265 lbs  Today's date: 12/31/2017 Total lbs lost to date: 12 (Patients must lose 7 lbs in the first 6 months to continue with counseling)   ASK: We discussed the diagnosis of obesity with [name] today and [name] agreed to give [name] permission to discuss obesity behavioral modification therapy today.  ASSESS: [name] has the diagnosis of obesity and her BMI today is 46.95 [name] is in the action stage of change   ADVISE: [name] was educated on the multiple health risks of obesity as well as the benefit of weight loss to improve her health. She was advised of the need for long term treatment and the importance of lifestyle modifications.  AGREE: Multiple dietary modification options and treatment options were discussed and  [name] agreed to the above obesity treatment plan.   Wilhemena Durie, am acting as transcriptionist for Lacy Duverney, PA-C I, Lacy Duverney [facility], have reviewed this note and agree with its content

## 2018-01-05 ENCOUNTER — Ambulatory Visit (INDEPENDENT_AMBULATORY_CARE_PROVIDER_SITE_OTHER): Payer: BLUE CROSS/BLUE SHIELD | Admitting: *Deleted

## 2018-01-05 ENCOUNTER — Telehealth: Payer: Self-pay

## 2018-01-05 DIAGNOSIS — I428 Other cardiomyopathies: Secondary | ICD-10-CM | POA: Diagnosis not present

## 2018-01-05 NOTE — Telephone Encounter (Signed)
Attempted to confirm remote transmission with pt. No answer and was unable to leave a message.   

## 2018-01-05 NOTE — Progress Notes (Signed)
Remote ICD transmission.   

## 2018-01-06 ENCOUNTER — Encounter: Payer: Self-pay | Admitting: Cardiology

## 2018-01-08 LAB — CUP PACEART REMOTE DEVICE CHECK
Battery Remaining Longevity: 100 mo
Battery Voltage: 3 V
Brady Statistic RV Percent Paced: 0.01 %
Date Time Interrogation Session: 20190716174619
HighPow Impedance: 74 Ohm
Implantable Lead Implant Date: 20150706
Implantable Lead Location: 753860
Implantable Pulse Generator Implant Date: 20150706
Lead Channel Impedance Value: 456 Ohm
Lead Channel Impedance Value: 570 Ohm
Lead Channel Pacing Threshold Amplitude: 0.375 V
Lead Channel Pacing Threshold Pulse Width: 0.4 ms
Lead Channel Sensing Intrinsic Amplitude: 8.375 mV
Lead Channel Sensing Intrinsic Amplitude: 8.375 mV
Lead Channel Setting Pacing Amplitude: 2 V
Lead Channel Setting Pacing Pulse Width: 0.4 ms
Lead Channel Setting Sensing Sensitivity: 0.3 mV

## 2018-01-18 ENCOUNTER — Ambulatory Visit (INDEPENDENT_AMBULATORY_CARE_PROVIDER_SITE_OTHER): Payer: BLUE CROSS/BLUE SHIELD | Admitting: Family Medicine

## 2018-01-18 VITALS — HR 61 | Temp 98.1°F | Ht 63.0 in | Wt 264.0 lb

## 2018-01-18 DIAGNOSIS — E559 Vitamin D deficiency, unspecified: Secondary | ICD-10-CM

## 2018-01-18 DIAGNOSIS — Z9189 Other specified personal risk factors, not elsewhere classified: Secondary | ICD-10-CM | POA: Diagnosis not present

## 2018-01-18 DIAGNOSIS — Z6841 Body Mass Index (BMI) 40.0 and over, adult: Secondary | ICD-10-CM | POA: Diagnosis not present

## 2018-01-18 DIAGNOSIS — R7303 Prediabetes: Secondary | ICD-10-CM | POA: Diagnosis not present

## 2018-01-18 MED ORDER — VITAMIN D (ERGOCALCIFEROL) 1.25 MG (50000 UNIT) PO CAPS: 50000.0000 [IU] | ORAL_CAPSULE | ORAL | 0 refills | Status: DC

## 2018-01-18 MED ORDER — METFORMIN HCL 500 MG PO TABS: 500.0000 mg | ORAL_TABLET | Freq: Every day | ORAL | 0 refills | Status: DC

## 2018-01-19 NOTE — Progress Notes (Signed)
Office: [phone]  /  Fax: [phone]   HPI:   Chief Complaint: OBESITY [name] is here to discuss her progress with her obesity treatment plan. She is on the Category 3 plan and is following her eating plan approximately 90 % of the time. She states she is riding stationary bike for 15 minutes 7 times per week. [name] denies hunger. Today is her husband's birthday. Starting to get tired with meal plan.  Her weight is 264 lb (119.7 kg) today and has had a weight loss of 1 pound over a period of 2 to 3 weeks since her last visit. She has lost 13 lbs since starting treatment with [name].  Vitamin D Deficiency [name] has a diagnosis of vitamin D deficiency. She is currently taking prescription Vit D. She notes fatigue and denies nausea, vomiting or muscle weakness.  Pre-Diabetes [name] has a diagnosis of pre-diabetes based on her elevated Hgb A1c and was informed this puts her at greater risk of developing diabetes. She notes carbohydrate cravings. She denies GI side effects of metformin and continues to work on diet and exercise to decrease risk of diabetes. She denies nausea or hypoglycemia.  At risk for diabetes Money is at higher than average risk for developing diabetes due to her obesity and pre-diabetes. She currently denies polyuria or polydipsia.  ALLERGIES: No Known Allergies  MEDICATIONS: Current Outpatient Medications on File Prior to Visit  Medication Sig Dispense Refill  . atorvastatin (LIPITOR) 40 MG tablet TAKE ONE TABLET BY MOUTH DAILY 30 tablet 3  . carvedilol (COREG) 25 MG tablet Take 1 tablet (25 mg total) by mouth 2 (two) times daily with a meal. 60 tablet 6  . digoxin (LANOXIN) 0.125 MG tablet TAKE 1 TABLET BY MOUTH ONCE DAILY 30 tablet 6  . ENTRESTO 24-26 MG TAKE 1 TABLET BY MOUTH TWICE DAILY 60 tablet 6  . furosemide (LASIX) 40 MG tablet TAKE 2 TABLETS BY MOUTH (8OMG) IN THE MORNING AND TAKE 1 TABLET (40MG ) IN THE EVENING AS INSTRUCTED 90 tablet 3  . ivabradine  (CORLANOR) 5 MG TABS tablet Take 1 tablet (5 mg total) by mouth 2 (two) times daily with a meal. 60 tablet 6  . potassium chloride SA (K-DUR,KLOR-CON) 20 MEQ tablet TAKE 1 TABLET BY MOUTH ONCE DAILY 60 tablet 6  . spironolactone (ALDACTONE) 25 MG tablet TAKE 1 TABLET BY MOUTH ONCE DAILY 30 tablet 6   No current facility-administered medications on file prior to visit.     PAST MEDICAL HISTORY: Past Medical History:  Diagnosis Date  . Automatic implantable cardioverter-defibrillator in situ   . Chronic CHF (congestive heart failure) (HCC)    a. EF 15-20% b. RHC (09/2013) RA 14, RV 57/22, PA 64/36 (48), PCWP 18, FIck CO/CI 3.7/1.6, PVR 8.1 WU, PA sat 47%   . History of stomach ulcers   . Hypertension   . Hypotension   . Hypothyroidism   . Morbid obesity ([place])   . Myocardial infarction ([place]) 08/2013  . Nocturnal dyspnea   . Nonischemic cardiomyopathy ([place])   . Sinus tachycardia   . Snoring-prob OSA 09/04/2011  . Sprint Fidelis ICD lead RECALL  [id number]     PAST SURGICAL HISTORY: Past Surgical History:  Procedure Laterality Date  . BREATH TEK H PYLORI N/A 11/09/2014   Procedure: BREATH TEK H PYLORI;  Surgeon: Greer Pickerel, MD;  Location: [facility];  Service: General;  Laterality: N/A;  . CARDIAC CATHETERIZATION  ~ 2006; 09/2013  . CARDIAC CATHETERIZATION N/A 05/23/2016  Procedure: Right Heart Cath;  Surgeon: Larey Dresser, MD;  Location: [facility];  Service: Cardiovascular;  Laterality: N/A;  . CARDIAC DEFIBRILLATOR PLACEMENT  2006; 12/26/2013   Medtronic [id number]; 12-2013 ICD gen change and RV lead revision with new 0626 RV lead by Dr [name]  . CESAREAN SECTION  1999  . IMPLANTABLE CARDIOVERTER DEFIBRILLATOR GENERATOR CHANGE N/A 12/26/2013   Procedure: IMPLANTABLE CARDIOVERTER DEFIBRILLATOR GENERATOR CHANGE;  Surgeon: Deboraha Sprang, MD;  Location: [facility] CATH LAB;  Service: Cardiovascular;  Laterality: N/A;  . LEAD REVISION N/A 12/26/2013   Procedure: LEAD REVISION;  Surgeon:  Deboraha Sprang, MD;  Location: [facility] CATH LAB;  Service: Cardiovascular;  Laterality: N/A;  . RIGHT HEART CATHETERIZATION N/A 02/15/2014   Procedure: RIGHT HEART CATH;  Surgeon: Larey Dresser, MD;  Location: [facility] CATH LAB;  Service: Cardiovascular;  Laterality: N/A;  . TUBAL LIGATION  1999    SOCIAL HISTORY: Social History   Tobacco Use  . Smoking status: Never Smoker  . Smokeless tobacco: Never Used  Substance Use Topics  . Alcohol use: No  . Drug use: No    FAMILY HISTORY: Family History  Problem Relation Age of Onset  . Heart disease Maternal Grandmother   . Heart disease Mother   . Obesity Mother   . High blood pressure Father   . Stroke Father     ROS: Review of Systems  Constitutional: Positive for malaise/fatigue and weight loss.  Gastrointestinal: Negative for nausea and vomiting.  Genitourinary: Negative for frequency.  Musculoskeletal:       Negative muscle weakness  Endo/Heme/Allergies: Negative for polydipsia.       Negative hypoglycemia    PHYSICAL EXAM: Pulse 61, temperature 98.1 F (36.7 C), temperature source Oral, height 5\' 3"  (1.6 m), weight 264 lb (119.7 kg), last menstrual period 12/20/2017, SpO2 100 %. Body mass index is 46.77 kg/m. Physical Exam  Constitutional: She is oriented to person, place, and time. She appears well-developed and well-nourished.  Cardiovascular: Normal rate.  Pulmonary/Chest: Effort normal.  Musculoskeletal: Normal range of motion.  Neurological: She is oriented to person, place, and time.  Skin: Skin is warm.  Psychiatric: She has a normal mood and affect. Her behavior is normal.  Vitals reviewed.   RECENT LABS AND TESTS: BMET    Component Value Date/Time   NA 139 11/27/2017 1220   NA 138 09/24/2013 0536   K 3.9 11/27/2017 1220   K 3.7 09/24/2013 0536   CL 107 11/27/2017 1220   CL 107 09/24/2013 0536   CO2 25 11/27/2017 1220   CO2 25 09/24/2013 0536   GLUCOSE 88 11/27/2017 1220   GLUCOSE 82 09/24/2013 0536    BUN 13 11/27/2017 1220   BUN 11 09/24/2013 0536   CREATININE 0.92 11/27/2017 1220   CREATININE 1.08 09/24/2013 0536   CALCIUM 9.5 11/27/2017 1220   CALCIUM 8.6 09/24/2013 0536   GFRNONAA >60 11/27/2017 1220   GFRNONAA >60 09/24/2013 0536   GFRAA >60 11/27/2017 1220   GFRAA >60 09/24/2013 0536   Lab Results  Component Value Date   HGBA1C 5.6 10/05/2017   HGBA1C 5.9 09/19/2013   Lab Results  Component Value Date   INSULIN 22.5 10/05/2017   CBC    Component Value Date/Time   WBC 8.9 10/05/2017 1218   WBC 10.6 (H) 05/20/2016 1515   RBC 4.84 10/05/2017 1218   RBC 4.64 05/20/2016 1515   HGB 13.6 10/05/2017 1218   HCT 41.7 10/05/2017 1218   PLT  269 05/20/2016 1515   PLT 195 09/23/2013 0443   MCV 86 10/05/2017 1218   MCV 85 09/23/2013 0443   MCH 28.1 10/05/2017 1218   MCH 28.2 05/20/2016 1515   MCHC 32.6 10/05/2017 1218   MCHC 32.8 05/20/2016 1515   RDW 14.5 10/05/2017 1218   RDW 15.4 (H) 09/23/2013 0443   LYMPHSABS 1.9 10/05/2017 1218   LYMPHSABS 2.0 09/23/2013 0443   MONOABS 0.5 12/19/2013 1158   MONOABS 0.4 09/23/2013 0443   EOSABS 0.2 10/05/2017 1218   EOSABS 0.1 09/23/2013 0443   BASOSABS 0.0 10/05/2017 1218   BASOSABS 0.1 09/23/2013 0443   Iron/TIBC/Ferritin/ %Sat No results found for: IRON, TIBC, FERRITIN, IRONPCTSAT Lipid Panel     Component Value Date/Time   CHOL 171 10/05/2017 1218   CHOL 183 09/20/2013 0424   TRIG 97 10/05/2017 1218   TRIG 105 09/20/2013 0424   HDL 57 10/05/2017 1218   HDL 40 09/20/2013 0424   CHOLHDL 3.3 07/21/2017 0854   VLDL 18 07/21/2017 0854   VLDL 21 09/20/2013 0424   LDLCALC 95 10/05/2017 1218   LDLCALC 122 (H) 09/20/2013 0424   Hepatic Function Panel     Component Value Date/Time   PROT 7.5 07/21/2017 0854   PROT 7.7 09/19/2013 0431   ALBUMIN 3.5 07/21/2017 0854   ALBUMIN 3.5 09/19/2013 0431   AST 17 07/21/2017 0854   AST 36 09/19/2013 0431   ALT 13 (L) 07/21/2017 0854   ALT 32 09/19/2013 0431   ALKPHOS 73  07/21/2017 0854   ALKPHOS 65 09/19/2013 0431   BILITOT 0.6 07/21/2017 0854   BILITOT 0.4 09/19/2013 0431   BILIDIR <0.1 (L) 07/21/2017 0854   IBILI NOT CALCULATED 07/21/2017 0854      Component Value Date/Time   TSH 3.680 10/05/2017 1218   TSH 5.995 (H) 11/14/2016 1354   TSH 7.068 (H) 11/10/2016 1040   TSH 2.90 02/29/2008 1132  Results for ISHANA, BLADES (MRN [id number]) as of 01/19/2018 09:36  Ref. Range 10/05/2017 12:18  Vitamin D, 25-Hydroxy Latest Ref Range: 30.0 - 100.0 ng/mL 6.2 (L)    ASSESSMENT AND PLAN: Vitamin D deficiency - Plan: Vitamin D, Ergocalciferol, (DRISDOL) 50000 units CAPS capsule  Prediabetes - Plan: metFORMIN (GLUCOPHAGE) 500 MG tablet  At risk for diabetes mellitus  Class 3 severe obesity with serious comorbidity and body mass index (BMI) of 45.0 to 49.9 in adult, unspecified obesity type ([place])  PLAN:  Vitamin D Deficiency Hendel was informed that low vitamin D levels contributes to fatigue and are associated with obesity, breast, and colon cancer. Tamryn agrees to continue taking prescription Vit D @50 ,000 IU every week #4 and we will refill for 1 month. She will follow up for routine testing of vitamin D, at least 2-3 times per year. She was informed of the risk of over-replacement of vitamin D and agrees to not increase her dose unless she discusses this with [name] first. Latiqua agrees to follow up with our clinic in 2 weeks.  Pre-Diabetes Irelynn will continue to work on weight loss, exercise, and decreasing simple carbohydrates in her diet to help decrease the risk of diabetes. We dicussed metformin including benefits and risks. She was informed that eating too many simple carbohydrates or too many calories at one sitting increases the likelihood of GI side effects. Luwanna agrees to continue taking metformin 500 mg PO daily #30 and we will refill for 1 month. Albany agrees to follow up with our clinic in 2 weeks as directed to  monitor her progress.  Diabetes risk  counselling Khya was given extended (15 minutes) diabetes prevention counseling today. She is 47 y.o. female and has risk factors for diabetes including obesity and pre-diabetes. We discussed intensive lifestyle modifications today with an emphasis on weight loss as well as increasing exercise and decreasing simple carbohydrates in her diet.  Obesity Adlean is currently in the action stage of change. As such, her goal is to continue with weight loss efforts She has agreed to follow the Category 3 plan Jewelene has been instructed to work up to a goal of 150 minutes of combined cardio and strengthening exercise per week or resistance and weight training for 5-7 minutes 2-3 times per week for weight loss and overall health benefits. We discussed the following Behavioral Modification Strategies today: increasing lean protein intake, increasing vegetables, work on meal planning and easy cooking plans, and planning for success   Giovanna has agreed to follow up with our clinic in 2 weeks. She was informed of the importance of frequent follow up visits to maximize her success with intensive lifestyle modifications for her multiple health conditions.   OBESITY BEHAVIORAL INTERVENTION VISIT  Today's visit was # 7 out of 22.  Starting weight: 277 lbs Starting date: 10/05/17 Today's weight : 264 lbs  Today's date: 01/18/2018 Total lbs lost to date: 13    ASK: We discussed the diagnosis of obesity with [name] today and Breeana agreed to give [name] permission to discuss obesity behavioral modification therapy today.  ASSESS: Young has the diagnosis of obesity and her BMI today is 46.78 Renada is in the action stage of change   ADVISE: Cherry was educated on the multiple health risks of obesity as well as the benefit of weight loss to improve her health. She was advised of the need for long term treatment and the importance of lifestyle modifications.  AGREE: Multiple dietary modification options and  treatment options were discussed and  Lowanda agreed to the above obesity treatment plan.   I, Trixie Dredge, am acting as transcriptionist for Ilene Qua, MD  I have reviewed the above documentation for accuracy and completeness, and I agree with the above. - Ilene Qua, MD

## 2018-01-27 ENCOUNTER — Other Ambulatory Visit (HOSPITAL_COMMUNITY): Payer: Self-pay | Admitting: Cardiology

## 2018-02-02 ENCOUNTER — Ambulatory Visit (INDEPENDENT_AMBULATORY_CARE_PROVIDER_SITE_OTHER): Payer: BLUE CROSS/BLUE SHIELD | Admitting: Physician Assistant

## 2018-02-02 VITALS — BP 87/59 | HR 84 | Temp 98.6°F | Ht 63.0 in | Wt 261.0 lb

## 2018-02-02 DIAGNOSIS — Z6841 Body Mass Index (BMI) 40.0 and over, adult: Secondary | ICD-10-CM | POA: Diagnosis not present

## 2018-02-02 DIAGNOSIS — E559 Vitamin D deficiency, unspecified: Secondary | ICD-10-CM

## 2018-02-02 NOTE — Progress Notes (Signed)
Office: [phone]  /  Fax: [phone]   HPI:   Chief Complaint: OBESITY [name] is here to discuss her progress with her obesity treatment plan. She is on the Category 3 plan and is following her eating plan approximately 90 % of the time. She states she is riding stationary bike and using resistance bands for 30 minutes 5 times per week. [name] did very well with weight loss. She reports sticking closely to the plan though she did enjoy birthday cake with family over the last few weeks.  Her weight is 261 lb (118.4 kg) today and has had a weight loss of 3 pounds over a period of 2 weeks since her last visit. She has lost 16 lbs since starting treatment with [name].  Vitamin D Deficiency [name] has a diagnosis of vitamin D deficiency. She is on prescription Vit D, last level not at goal. She denies nausea, vomiting or muscle weakness.  ALLERGIES: No Known Allergies  MEDICATIONS: Current Outpatient Medications on File Prior to Visit  Medication Sig Dispense Refill  . atorvastatin (LIPITOR) 40 MG tablet TAKE ONE TABLET BY MOUTH DAILY 30 tablet 3  . carvedilol (COREG) 25 MG tablet Take 1 tablet (25 mg total) by mouth 2 (two) times daily with a meal. 60 tablet 6  . digoxin (LANOXIN) 0.125 MG tablet TAKE 1 TABLET BY MOUTH ONCE DAILY 30 tablet 6  . ENTRESTO 24-26 MG TAKE 1 TABLET BY MOUTH TWICE (2) DAILY 60 tablet 5  . furosemide (LASIX) 40 MG tablet TAKE 2 TABLETS BY MOUTH (8OMG) IN THE MORNING AND TAKE 1 TABLET (40MG ) IN THE EVENING AS INSTRUCTED 90 tablet 3  . ivabradine (CORLANOR) 5 MG TABS tablet Take 1 tablet (5 mg total) by mouth 2 (two) times daily with a meal. 60 tablet 6  . metFORMIN (GLUCOPHAGE) 500 MG tablet Take 1 tablet (500 mg total) by mouth daily with breakfast. 30 tablet 0  . potassium chloride SA (K-DUR,KLOR-CON) 20 MEQ tablet TAKE 1 TABLET BY MOUTH ONCE DAILY 60 tablet 6  . spironolactone (ALDACTONE) 25 MG tablet TAKE 1 TABLET BY MOUTH ONCE DAILY 30 tablet 6  . Vitamin D,  Ergocalciferol, (DRISDOL) 50000 units CAPS capsule Take 1 capsule (50,000 Units total) by mouth every 7 (seven) days. 4 capsule 0   No current facility-administered medications on file prior to visit.     PAST MEDICAL HISTORY: Past Medical History:  Diagnosis Date  . Automatic implantable cardioverter-defibrillator in situ   . Chronic CHF (congestive heart failure) (HCC)    a. EF 15-20% b. RHC (09/2013) RA 14, RV 57/22, PA 64/36 (48), PCWP 18, FIck CO/CI 3.7/1.6, PVR 8.1 WU, PA sat 47%   . History of stomach ulcers   . Hypertension   . Hypotension   . Hypothyroidism   . Morbid obesity ([place])   . Myocardial infarction ([place]) 08/2013  . Nocturnal dyspnea   . Nonischemic cardiomyopathy ([place])   . Sinus tachycardia   . Snoring-prob OSA 09/04/2011  . Sprint Fidelis ICD lead RECALL  [id number]     PAST SURGICAL HISTORY: Past Surgical History:  Procedure Laterality Date  . BREATH TEK H PYLORI N/A 11/09/2014   Procedure: BREATH TEK H PYLORI;  Surgeon: Greer Pickerel, MD;  Location: [facility];  Service: General;  Laterality: N/A;  . CARDIAC CATHETERIZATION  ~ 2006; 09/2013  . CARDIAC CATHETERIZATION N/A 05/23/2016   Procedure: Right Heart Cath;  Surgeon: Larey [facility]er, MD;  Location: [facility];  Service: Cardiovascular;  Laterality: N/A;  . CARDIAC DEFIBRILLATOR PLACEMENT  2006; 12/26/2013   Medtronic [id number]; 12-2013 ICD gen change and RV lead revision with new 6935 RV lead by Dr [name]  . CESAREAN SECTION  1999  . IMPLANTABLE CARDIOVERTER DEFIBRILLATOR GENERATOR CHANGE N/A 12/26/2013   Procedure: IMPLANTABLE CARDIOVERTER DEFIBRILLATOR GENERATOR CHANGE;  Surgeon: Deboraha Sprang, MD;  Location: [facility] CATH LAB;  Service: Cardiovascular;  Laterality: N/A;  . LEAD REVISION N/A 12/26/2013   Procedure: LEAD REVISION;  Surgeon: Deboraha Sprang, MD;  Location: [facility] CATH LAB;  Service: Cardiovascular;  Laterality: N/A;  . RIGHT HEART CATHETERIZATION N/A 02/15/2014   Procedure: RIGHT HEART CATH;   Surgeon: Larey Dresser, MD;  Location: [facility] CATH LAB;  Service: Cardiovascular;  Laterality: N/A;  . TUBAL LIGATION  1999    SOCIAL HISTORY: Social History   Tobacco Use  . Smoking status: Never Smoker  . Smokeless tobacco: Never Used  Substance Use Topics  . Alcohol use: No  . Drug use: No    FAMILY HISTORY: Family History  Problem Relation Age of Onset  . Heart disease Maternal Grandmother   . Heart disease Mother   . Obesity Mother   . High blood pressure Father   . Stroke Father     ROS: Review of Systems  Constitutional: Positive for weight loss.  Gastrointestinal: Negative for nausea and vomiting.  Musculoskeletal:       Negative muscle weakness    PHYSICAL EXAM: Blood pressure (!) 87/59, pulse 84, temperature 98.6 F (37 C), temperature source Oral, height 5\' 3"  (1.6 m), weight 261 lb (118.4 kg), SpO2 100 %. Body mass index is 46.23 kg/m. Physical Exam  Constitutional: She is oriented to person, place, and time. She appears well-developed and well-nourished.  Cardiovascular: Normal rate.  Pulmonary/Chest: Effort normal.  Musculoskeletal: Normal range of motion.  Neurological: She is oriented to person, place, and time.  Skin: Skin is warm and dry.  Psychiatric: She has a normal mood and affect. Her behavior is normal.  Vitals reviewed.   RECENT LABS AND TESTS: BMET    Component Value Date/Time   NA 139 11/27/2017 1220   NA 138 09/24/2013 0536   K 3.9 11/27/2017 1220   K 3.7 09/24/2013 0536   CL 107 11/27/2017 1220   CL 107 09/24/2013 0536   CO2 25 11/27/2017 1220   CO2 25 09/24/2013 0536   GLUCOSE 88 11/27/2017 1220   GLUCOSE 82 09/24/2013 0536   BUN 13 11/27/2017 1220   BUN 11 09/24/2013 0536   CREATININE 0.92 11/27/2017 1220   CREATININE 1.08 09/24/2013 0536   CALCIUM 9.5 11/27/2017 1220   CALCIUM 8.6 09/24/2013 0536   GFRNONAA >60 11/27/2017 1220   GFRNONAA >60 09/24/2013 0536   GFRAA >60 11/27/2017 1220   GFRAA >60 09/24/2013 0536    Lab Results  Component Value Date   HGBA1C 5.6 10/05/2017   HGBA1C 5.9 09/19/2013   Lab Results  Component Value Date   INSULIN 22.5 10/05/2017   CBC    Component Value Date/Time   WBC 8.9 10/05/2017 1218   WBC 10.6 (H) 05/20/2016 1515   RBC 4.84 10/05/2017 1218   RBC 4.64 05/20/2016 1515   HGB 13.6 10/05/2017 1218   HCT 41.7 10/05/2017 1218   PLT 269 05/20/2016 1515   PLT 195 09/23/2013 0443   MCV 86 10/05/2017 1218   MCV 85 09/23/2013 0443   MCH 28.1 10/05/2017 1218   MCH 28.2 05/20/2016 1515   MCHC 32.6 10/05/2017  1218   MCHC 32.8 05/20/2016 1515   RDW 14.5 10/05/2017 1218   RDW 15.4 (H) 09/23/2013 0443   LYMPHSABS 1.9 10/05/2017 1218   LYMPHSABS 2.0 09/23/2013 0443   MONOABS 0.5 12/19/2013 1158   MONOABS 0.4 09/23/2013 0443   EOSABS 0.2 10/05/2017 1218   EOSABS 0.1 09/23/2013 0443   BASOSABS 0.0 10/05/2017 1218   BASOSABS 0.1 09/23/2013 0443   Iron/TIBC/Ferritin/ %Sat No results found for: IRON, TIBC, FERRITIN, IRONPCTSAT Lipid Panel     Component Value Date/Time   CHOL 171 10/05/2017 1218   CHOL 183 09/20/2013 0424   TRIG 97 10/05/2017 1218   TRIG 105 09/20/2013 0424   HDL 57 10/05/2017 1218   HDL 40 09/20/2013 0424   CHOLHDL 3.3 07/21/2017 0854   VLDL 18 07/21/2017 0854   VLDL 21 09/20/2013 0424   LDLCALC 95 10/05/2017 1218   LDLCALC 122 (H) 09/20/2013 0424   Hepatic Function Panel     Component Value Date/Time   PROT 7.5 07/21/2017 0854   PROT 7.7 09/19/2013 0431   ALBUMIN 3.5 07/21/2017 0854   ALBUMIN 3.5 09/19/2013 0431   AST 17 07/21/2017 0854   AST 36 09/19/2013 0431   ALT 13 (L) 07/21/2017 0854   ALT 32 09/19/2013 0431   ALKPHOS 73 07/21/2017 0854   ALKPHOS 65 09/19/2013 0431   BILITOT 0.6 07/21/2017 0854   BILITOT 0.4 09/19/2013 0431   BILIDIR <0.1 (L) 07/21/2017 0854   IBILI NOT CALCULATED 07/21/2017 0854      Component Value Date/Time   TSH 3.680 10/05/2017 1218   TSH 5.995 (H) 11/14/2016 1354   TSH 7.068 (H) 11/10/2016  1040   TSH 2.90 02/29/2008 1132  Results for AYIANA, WINSLETT (MRN [id number]) as of 02/02/2018 13:03  Ref. Range 10/05/2017 12:18  Vitamin D, 25-Hydroxy Latest Ref Range: 30.0 - 100.0 ng/mL 6.2 (L)    ASSESSMENT AND PLAN: Vitamin D deficiency  Class 3 severe obesity with serious comorbidity and body mass index (BMI) of 45.0 to 49.9 in adult, unspecified obesity type ([place])  PLAN:  Vitamin D Deficiency [name] was informed that low vitamin D levels contributes to fatigue and are associated with obesity, breast, and colon cancer. [name] agrees to continue taking prescription Vit D @50 ,000 IU every week and will follow up for routine testing of vitamin D, at least 2-3 times per year. She was informed of the risk of over-replacement of vitamin D and agrees to not increase her dose unless she discusses this with [name] first. [name] agrees to follow up with our clinic in 2 to 3 weeks.  We spent > than 50% of the 15 minute visit on the counseling as documented in the note.  Obesity [name] is currently in the action stage of change. As such, her goal is to continue with weight loss efforts She has agreed to follow the Category 3 plan [name] has been instructed to work up to a goal of 150 minutes of combined cardio and strengthening exercise per week for weight loss and overall health benefits. We discussed the following Behavioral Modification Strategies today: work on meal planning and easy cooking plans and planning for success   [name] has agreed to follow up with our clinic in 2 to 3 weeks. She was informed of the importance of frequent follow up visits to maximize her success with intensive lifestyle modifications for her multiple health conditions.   OBESITY BEHAVIORAL INTERVENTION VISIT  Today's visit was # 8 out of 22.  Starting weight:  277 lbs Starting date: 10/05/17 Today's weight : 261 lbs  Today's date: 02/02/2018 Total lbs lost to date: 16    ASK: We discussed the diagnosis of obesity  with [name] today and [name] agreed to give [name] permission to discuss obesity behavioral modification therapy today.  ASSESS: [name] has the diagnosis of obesity and her BMI today is 46.25 [name] is in the action stage of change   ADVISE: [name] was educated on the multiple health risks of obesity as well as the benefit of weight loss to improve her health. She was advised of the need for long term treatment and the importance of lifestyle modifications.  AGREE: Multiple dietary modification options and treatment options were discussed and  [name] agreed to the above obesity treatment plan.  [name] Durie, am acting as transcriptionist for Abby Potash, PA-C I, Abby Potash, PA-C have reviewed above note and agree with its content

## 2018-02-17 ENCOUNTER — Encounter (INDEPENDENT_AMBULATORY_CARE_PROVIDER_SITE_OTHER): Payer: Self-pay | Admitting: Physician Assistant

## 2018-02-17 ENCOUNTER — Ambulatory Visit (INDEPENDENT_AMBULATORY_CARE_PROVIDER_SITE_OTHER): Payer: BLUE CROSS/BLUE SHIELD | Admitting: Physician Assistant

## 2018-02-17 VITALS — BP 103/66 | HR 77 | Temp 98.4°F | Ht 63.0 in | Wt 261.0 lb

## 2018-02-17 DIAGNOSIS — Z9189 Other specified personal risk factors, not elsewhere classified: Secondary | ICD-10-CM | POA: Diagnosis not present

## 2018-02-17 DIAGNOSIS — E559 Vitamin D deficiency, unspecified: Secondary | ICD-10-CM | POA: Diagnosis not present

## 2018-02-17 DIAGNOSIS — R7303 Prediabetes: Secondary | ICD-10-CM

## 2018-02-17 DIAGNOSIS — Z6841 Body Mass Index (BMI) 40.0 and over, adult: Secondary | ICD-10-CM

## 2018-02-17 MED ORDER — METFORMIN HCL 500 MG PO TABS: 500.0000 mg | ORAL_TABLET | Freq: Every day | ORAL | 0 refills | Status: DC

## 2018-02-17 MED ORDER — VITAMIN D (ERGOCALCIFEROL) 1.25 MG (50000 UNIT) PO CAPS: 50000.0000 [IU] | ORAL_CAPSULE | ORAL | 0 refills | Status: DC

## 2018-02-17 NOTE — Progress Notes (Signed)
Office: [phone]  /  Fax: [phone]   HPI:   Chief Complaint: OBESITY [name] is here to discuss her progress with her obesity treatment plan. She is on the  follow the Category 3 plan and is following her eating plan approximately 90 % of the time. She states she is exercising 30 minutes 5 times per week. [name] is currently struggling with following the meal plan due to multiple birthday celebrations. She is ready to get back on track.   Her weight is 261 lb (118.4 kg) today and she has maintained her weight  since her last visit. She has lost 16 lbs since starting treatment with [name].  Vitamin D deficiency [name] has a diagnosis of vitamin D deficiency. She is currently taking vit D and denies nausea, vomiting or muscle weakness.   Ref. Range 10/05/2017 12:18  Vitamin D, 25-Hydroxy Latest Ref Range: 30.0 - 100.0 ng/mL 6.2 (L)   Insulin Resistance [name] has a diagnosis of insulin resistance based on her elevated fasting insulin level >5. Although [name]'s blood glucose readings are still under good control, insulin resistance puts her at greater risk of metabolic syndrome and diabetes. She is taking metformin currently and continues to work on diet and exercise to decrease risk of diabetes. She denies polyphagia.   At risk for osteopenia and osteoporosis [name] is at higher risk of osteopenia and osteoporosis due to vitamin D deficiency.   ALLERGIES: No Known Allergies  MEDICATIONS: Current Outpatient Medications on File Prior to Visit  Medication Sig Dispense Refill  . atorvastatin (LIPITOR) 40 MG tablet TAKE ONE TABLET BY MOUTH DAILY 30 tablet 3  . carvedilol (COREG) 25 MG tablet Take 1 tablet (25 mg total) by mouth 2 (two) times daily with a meal. 60 tablet 6  . digoxin (LANOXIN) 0.125 MG tablet TAKE 1 TABLET BY MOUTH ONCE DAILY 30 tablet 6  . ENTRESTO 24-26 MG TAKE 1 TABLET BY MOUTH TWICE (2) DAILY 60 tablet 5  . furosemide (LASIX) 40 MG tablet TAKE 2 TABLETS BY MOUTH (8OMG)  IN THE MORNING AND TAKE 1 TABLET (40MG ) IN THE EVENING AS INSTRUCTED 90 tablet 3  . ivabradine (CORLANOR) 5 MG TABS tablet Take 1 tablet (5 mg total) by mouth 2 (two) times daily with a meal. 60 tablet 6  . potassium chloride SA (K-DUR,KLOR-CON) 20 MEQ tablet TAKE 1 TABLET BY MOUTH ONCE DAILY 60 tablet 6  . spironolactone (ALDACTONE) 25 MG tablet TAKE 1 TABLET BY MOUTH ONCE DAILY 30 tablet 6   No current facility-administered medications on file prior to visit.     PAST MEDICAL HISTORY: Past Medical History:  Diagnosis Date  . Automatic implantable cardioverter-defibrillator in situ   . Chronic CHF (congestive heart failure) (HCC)    a. EF 15-20% b. RHC (09/2013) RA 14, RV 57/22, PA 64/36 (48), PCWP 18, FIck CO/CI 3.7/1.6, PVR 8.1 WU, PA sat 47%   . History of stomach ulcers   . Hypertension   . Hypotension   . Hypothyroidism   . Morbid obesity ([place])   . Myocardial infarction ([place]) 08/2013  . Nocturnal dyspnea   . Nonischemic cardiomyopathy ([place])   . Sinus tachycardia   . Snoring-prob OSA 09/04/2011  . Sprint Fidelis ICD lead RECALL  [id number]     PAST SURGICAL HISTORY: Past Surgical History:  Procedure Laterality Date  . BREATH TEK H PYLORI N/A 11/09/2014   Procedure: BREATH TEK H PYLORI;  Surgeon: Greer Pickerel, MD;  Location: [facility];  Service: General;  Laterality: N/A;  . CARDIAC CATHETERIZATION  ~ 2006; 09/2013  . CARDIAC CATHETERIZATION N/A 05/23/2016   Procedure: Right Heart Cath;  Surgeon: Larey Dresser, MD;  Location: [facility];  Service: Cardiovascular;  Laterality: N/A;  . CARDIAC DEFIBRILLATOR PLACEMENT  2006; 12/26/2013   Medtronic [id number]; 12-2013 ICD gen change and RV lead revision with new 7510 RV lead by Dr [name]  . CESAREAN SECTION  1999  . IMPLANTABLE CARDIOVERTER DEFIBRILLATOR GENERATOR CHANGE N/A 12/26/2013   Procedure: IMPLANTABLE CARDIOVERTER DEFIBRILLATOR GENERATOR CHANGE;  Surgeon: Deboraha Sprang, MD;  Location: [facility] CATH LAB;  Service:  Cardiovascular;  Laterality: N/A;  . LEAD REVISION N/A 12/26/2013   Procedure: LEAD REVISION;  Surgeon: Deboraha Sprang, MD;  Location: [facility] CATH LAB;  Service: Cardiovascular;  Laterality: N/A;  . RIGHT HEART CATHETERIZATION N/A 02/15/2014   Procedure: RIGHT HEART CATH;  Surgeon: Larey Dresser, MD;  Location[facility] CATH LAB;  Service: Cardiovascular;  Laterality: N/A;  . TUBAL LIGATION  1999    SOCIAL HISTORY: Social History   Tobacco Use  . Smoking status: Never Smoker  . Smokeless tobacco: Never Used  Substance Use Topics  . Alcohol use: No  . Drug use: No    FAMILY HISTORY: Family History  Problem Relation Age of Onset  . Heart disease Maternal Grandmother   . Heart disease Mother   . Obesity Mother   . High blood pressure Father   . Stroke Father     ROS: Review of Systems  Constitutional: Negative for weight loss.  Gastrointestinal: Negative for nausea and vomiting.  Musculoskeletal:       Negative for muscle weakness  Endo/Heme/Allergies:       Negative for polyphagia    PHYSICAL EXAM: Blood pressure 103/66, pulse 77, temperature 98.4 F (36.9 C), temperature source Oral, height 5\' 3"  (1.6 m), weight 261 lb (118.4 kg), SpO2 100 %. Body mass index is 46.23 kg/m. Physical Exam  Constitutional: She is oriented to person, place, and time. She appears well-developed and well-nourished.  HENT:  Head: Normocephalic.  Eyes: Pupils are equal, round, and reactive to light. EOM are normal.  Neck: Normal range of motion.  Cardiovascular: Normal rate.  Pulmonary/Chest: Effort normal.  Musculoskeletal: Normal range of motion.  Neurological: She is alert and oriented to person, place, and time.  Skin: Skin is warm and dry.  Psychiatric: She has a normal mood and affect. Her behavior is normal.  Vitals reviewed.   RECENT LABS AND TESTS: BMET    Component Value Date/Time   NA 139 11/27/2017 1220   NA 138 09/24/2013 0536   K 3.9 11/27/2017 1220   K 3.7 09/24/2013 0536     CL 107 11/27/2017 1220   CL 107 09/24/2013 0536   CO2 25 11/27/2017 1220   CO2 25 09/24/2013 0536   GLUCOSE 88 11/27/2017 1220   GLUCOSE 82 09/24/2013 0536   BUN 13 11/27/2017 1220   BUN 11 09/24/2013 0536   CREATININE 0.92 11/27/2017 1220   CREATININE 1.08 09/24/2013 0536   CALCIUM 9.5 11/27/2017 1220   CALCIUM 8.6 09/24/2013 0536   GFRNONAA >60 11/27/2017 1220   GFRNONAA >60 09/24/2013 0536   GFRAA >60 11/27/2017 1220   GFRAA >60 09/24/2013 0536   Lab Results  Component Value Date   HGBA1C 5.6 10/05/2017   HGBA1C 5.9 09/19/2013   Lab Results  Component Value Date   INSULIN 22.5 10/05/2017   CBC    Component Value Date/Time  WBC 8.9 10/05/2017 1218   WBC 10.6 (H) 05/20/2016 1515   RBC 4.84 10/05/2017 1218   RBC 4.64 05/20/2016 1515   HGB 13.6 10/05/2017 1218   HCT 41.7 10/05/2017 1218   PLT 269 05/20/2016 1515   PLT 195 09/23/2013 0443   MCV 86 10/05/2017 1218   MCV 85 09/23/2013 0443   MCH 28.1 10/05/2017 1218   MCH 28.2 05/20/2016 1515   MCHC 32.6 10/05/2017 1218   MCHC 32.8 05/20/2016 1515   RDW 14.5 10/05/2017 1218   RDW 15.4 (H) 09/23/2013 0443   LYMPHSABS 1.9 10/05/2017 1218   LYMPHSABS 2.0 09/23/2013 0443   MONOABS 0.5 12/19/2013 1158   MONOABS 0.4 09/23/2013 0443   EOSABS 0.2 10/05/2017 1218   EOSABS 0.1 09/23/2013 0443   BASOSABS 0.0 10/05/2017 1218   BASOSABS 0.1 09/23/2013 0443   Iron/TIBC/Ferritin/ %Sat No results found for: IRON, TIBC, FERRITIN, IRONPCTSAT Lipid Panel     Component Value Date/Time   CHOL 171 10/05/2017 1218   CHOL 183 09/20/2013 0424   TRIG 97 10/05/2017 1218   TRIG 105 09/20/2013 0424   HDL 57 10/05/2017 1218   HDL 40 09/20/2013 0424   CHOLHDL 3.3 07/21/2017 0854   VLDL 18 07/21/2017 0854   VLDL 21 09/20/2013 0424   LDLCALC 95 10/05/2017 1218   LDLCALC 122 (H) 09/20/2013 0424   Hepatic Function Panel     Component Value Date/Time   PROT 7.5 07/21/2017 0854   PROT 7.7 09/19/2013 0431   ALBUMIN 3.5  07/21/2017 0854   ALBUMIN 3.5 09/19/2013 0431   AST 17 07/21/2017 0854   AST 36 09/19/2013 0431   ALT 13 (L) 07/21/2017 0854   ALT 32 09/19/2013 0431   ALKPHOS 73 07/21/2017 0854   ALKPHOS 65 09/19/2013 0431   BILITOT 0.6 07/21/2017 0854   BILITOT 0.4 09/19/2013 0431   BILIDIR <0.1 (L) 07/21/2017 0854   IBILI NOT CALCULATED 07/21/2017 0854      Component Value Date/Time   TSH 3.680 10/05/2017 1218   TSH 5.995 (H) 11/14/2016 1354   TSH 7.068 (H) 11/10/2016 1040   TSH 2.90 02/29/2008 1132     Ref. Range 10/05/2017 12:18  Vitamin D, 25-Hydroxy Latest Ref Range: 30.0 - 100.0 ng/mL 6.2 (L)   ASSESSMENT AND PLAN: Vitamin D deficiency - Plan: Vitamin D, Ergocalciferol, (DRISDOL) 50000 units CAPS capsule  Prediabetes - Plan: metFORMIN (GLUCOPHAGE) 500 MG tablet  At risk for osteoporosis  Class 3 severe obesity with serious comorbidity and body mass index (BMI) of 45.0 to 49.9 in adult, unspecified obesity type ([place])  PLAN: Vitamin D Deficiency Ainsleigh was informed that low vitamin D levels contributes to fatigue and are associated with obesity, breast, and colon cancer. She agrees to continue to take prescription. Refill on Vit D @50 ,000 IU every week #4 with no refills written today and will follow up for routine testing of vitamin D, at least 2-3 times per year. She was informed of the risk of over-replacement of vitamin D and agrees to not increase her dose unless she discusses this with [name] first. We will check labs at next visit. She agrees to follow up with our clinic as directed.   Insulin Resistance Dawanna will continue to work on weight loss, exercise, and decreasing simple carbohydrates in her diet to help decrease the risk of diabetes. We dicussed metformin including benefits and risks. She was informed that eating too many simple carbohydrates or too many calories at one sitting increases the likelihood of  GI side effects. Refill for metformin 500 mg daily #30 with no refills. We  will check labs at next visit. Jerri agreed to follow up with [name] as directed to monitor her progress.  At risk for osteopenia and osteoporosis Rodney is at risk for osteopenia and osteoporsis due to her vitamin D deficiency. She was encouraged to take her vitamin D and follow her higher calcium diet and increase strengthening exercise to help strengthen her bones and decrease her risk of osteopenia and osteoporosis.  Obesity Jerilynn is currently in the action stage of change. As such, her goal is to continue with weight loss efforts She has agreed to follow the Category 3 plan Karris has been instructed to work up to a goal of 150 minutes of combined cardio and strengthening exercise per week for weight loss and overall health benefits. We discussed the following Behavioral Modification Stratagies today: work on meal planning and easy cooking plans and ways to avoid boredom eating   Kenslee has agreed to follow up with our clinic in 3 weeks. She was informed of the importance of frequent follow up visits to maximize her success with intensive lifestyle modifications for her multiple health conditions.   OBESITY BEHAVIORAL INTERVENTION VISIT  Today's visit was # 9   Starting weight: 277 lb Starting date: 10/05/17 Today's weight : Weight: 261 lb (118.4 kg)  Today's date: 02/17/2018 Total lbs lost to date: 16 lb At least 15 minutes were spent on discussing the following behavioral intervention visit.   ASK: We discussed the diagnosis of obesity with [name] today and Lynna agreed to give [name] permission to discuss obesity behavioral modification therapy today.  ASSESS: Korie has the diagnosis of obesity and her BMI today is 46.25 Nupur is in the action stage of change   ADVISE: Sanaai was educated on the multiple health risks of obesity as well as the benefit of weight loss to improve her health. She was advised of the need for long term treatment and the importance of lifestyle  modifications to improve her current health and to decrease her risk of future health problems.  AGREE: Multiple dietary modification options and treatment options were discussed and  Destynie agreed to follow the recommendations documented in the above note.  ARRANGE: Krisha was educated on the importance of frequent visits to treat obesity as outlined per CMS and USPSTF guidelines and agreed to schedule her next follow up appointment today.  Leary Roca, am acting as transcriptionist for Abby Potash, PA-C I, Abby Potash, PA-C have reviewed above note and agree with its content

## 2018-03-10 ENCOUNTER — Ambulatory Visit (INDEPENDENT_AMBULATORY_CARE_PROVIDER_SITE_OTHER): Payer: BLUE CROSS/BLUE SHIELD | Admitting: Physician Assistant

## 2018-03-10 ENCOUNTER — Other Ambulatory Visit (INDEPENDENT_AMBULATORY_CARE_PROVIDER_SITE_OTHER): Payer: Self-pay | Admitting: Physician Assistant

## 2018-03-10 VITALS — BP 104/72 | HR 63 | Temp 98.0°F | Ht 63.0 in | Wt 255.0 lb

## 2018-03-10 DIAGNOSIS — E8881 Metabolic syndrome: Secondary | ICD-10-CM | POA: Diagnosis not present

## 2018-03-10 DIAGNOSIS — Z9189 Other specified personal risk factors, not elsewhere classified: Secondary | ICD-10-CM | POA: Diagnosis not present

## 2018-03-10 DIAGNOSIS — E7849 Other hyperlipidemia: Secondary | ICD-10-CM | POA: Diagnosis not present

## 2018-03-10 DIAGNOSIS — Z6841 Body Mass Index (BMI) 40.0 and over, adult: Secondary | ICD-10-CM

## 2018-03-10 DIAGNOSIS — E559 Vitamin D deficiency, unspecified: Secondary | ICD-10-CM | POA: Diagnosis not present

## 2018-03-10 MED ORDER — VITAMIN D (ERGOCALCIFEROL) 1.25 MG (50000 UNIT) PO CAPS: 50000.0000 [IU] | ORAL_CAPSULE | ORAL | 0 refills | Status: DC

## 2018-03-11 LAB — COMPREHENSIVE METABOLIC PANEL
ALT: 10 IU/L (ref 0–32)
AST: 17 IU/L (ref 0–40)
Albumin/Globulin Ratio: 1.4 (ref 1.2–2.2)
Albumin: 4.5 g/dL (ref 3.5–5.5)
Alkaline Phosphatase: 88 IU/L (ref 39–117)
BUN/Creatinine Ratio: 10 (ref 9–23)
BUN: 10 mg/dL (ref 6–24)
Bilirubin Total: 0.4 mg/dL (ref 0.0–1.2)
CO2: 23 mmol/L (ref 20–29)
Calcium: 9.8 mg/dL (ref 8.7–10.2)
Chloride: 97 mmol/L (ref 96–106)
Creatinine, Ser: 0.96 mg/dL (ref 0.57–1.00)
GFR calc Af Amer: 81 mL/min/{1.73_m2} (ref >59)
GFR calc non Af Amer: 71 mL/min/{1.73_m2} (ref >59)
Globulin, Total: 3.3 g/dL (ref 1.5–4.5)
Glucose: 83 mg/dL (ref 65–99)
Potassium: 4 mmol/L (ref 3.5–5.2)
Sodium: 137 mmol/L (ref 134–144)
Total Protein: 7.8 g/dL (ref 6.0–8.5)

## 2018-03-11 LAB — LIPID PANEL
Chol/HDL Ratio: 3.1 ratio (ref 0.0–4.4)
Cholesterol, Total: 169 mg/dL (ref 100–199)
HDL: 55 mg/dL (ref >39)
LDL Calculated: 99 mg/dL (ref 0–99)
Triglycerides: 74 mg/dL (ref 0–149)
VLDL Cholesterol Cal: 15 mg/dL (ref 5–40)

## 2018-03-11 LAB — VITAMIN D 25 HYDROXY (VIT D DEFICIENCY, FRACTURES): Vit D, 25-Hydroxy: 36 ng/mL (ref 30.0–100.0)

## 2018-03-11 LAB — INSULIN, RANDOM: INSULIN: 7.9 u[IU]/mL (ref 2.6–24.9)

## 2018-03-11 LAB — HEMOGLOBIN A1C
Est. average glucose Bld gHb Est-mCnc: 108 mg/dL
Hgb A1c MFr Bld: 5.4 % (ref 4.8–5.6)

## 2018-03-11 NOTE — Progress Notes (Signed)
Office: [phone]  /  Fax: [phone]   HPI:   Chief Complaint: OBESITY [name] is here to discuss her progress with her obesity treatment plan. She is on the Category 3 plan and is following her eating plan approximately 85 % of the time. She states she is walking, biking, and using resistance bands for 10-15 minutes 4-5 times per week. [name] did very well with weight loss. She reports that she has been following the plan closely. She is seeing her Cardiologist on October 15th for a check up.  Her weight is 255 lb (115.7 kg) today and has had a weight loss of 6 pounds over a period of 3 weeks since her last visit. She has lost 22 lbs since starting treatment with [name].  Vitamin D Deficiency [name] has a diagnosis of vitamin D deficiency. She is on prescription Vit D and denies nausea, vomiting or muscle weakness.  At risk for osteopenia and osteoporosis [name] is at higher risk of osteopenia and osteoporosis due to vitamin D deficiency.   Hyperlipidemia [name] has hyperlipidemia and has been trying to improve her cholesterol levels with intensive lifestyle modification including a low saturated fat diet, exercise and weight loss. She is on atorvastatin and denies any chest pain, claudication or myalgias.  Insulin Resistance [name] has a diagnosis of insulin resistance based on her elevated fasting insulin level >5. Although Amandalee's blood glucose readings are still under good control, insulin resistance puts her at greater risk of metabolic syndrome and diabetes. She is on metformin and denies nausea, vomiting, or diarrhea. She continues to work on diet and exercise to decrease risk of diabetes. She denies polyphagia or hypoglycemia.  ALLERGIES: No Known Allergies  MEDICATIONS: Current Outpatient Medications on File Prior to Visit  Medication Sig Dispense Refill  . atorvastatin (LIPITOR) 40 MG tablet TAKE ONE TABLET BY MOUTH DAILY 30 tablet 3  . carvedilol (COREG) 25 MG tablet Take 1  tablet (25 mg total) by mouth 2 (two) times daily with a meal. 60 tablet 6  . digoxin (LANOXIN) 0.125 MG tablet TAKE 1 TABLET BY MOUTH ONCE DAILY 30 tablet 6  . ENTRESTO 24-26 MG TAKE 1 TABLET BY MOUTH TWICE (2) DAILY 60 tablet 5  . furosemide (LASIX) 40 MG tablet TAKE 2 TABLETS BY MOUTH (8OMG) IN THE MORNING AND TAKE 1 TABLET (40MG ) IN THE EVENING AS INSTRUCTED 90 tablet 3  . ivabradine (CORLANOR) 5 MG TABS tablet Take 1 tablet (5 mg total) by mouth 2 (two) times daily with a meal. 60 tablet 6  . metFORMIN (GLUCOPHAGE) 500 MG tablet Take 1 tablet (500 mg total) by mouth daily with breakfast. 30 tablet 0  . potassium chloride SA (K-DUR,KLOR-CON) 20 MEQ tablet TAKE 1 TABLET BY MOUTH ONCE DAILY 60 tablet 6  . spironolactone (ALDACTONE) 25 MG tablet TAKE 1 TABLET BY MOUTH ONCE DAILY 30 tablet 6   No current facility-administered medications on file prior to visit.     PAST MEDICAL HISTORY: Past Medical History:  Diagnosis Date  . Automatic implantable cardioverter-defibrillator in situ   . Chronic CHF (congestive heart failure) (HCC)    a. EF 15-20% b. RHC (09/2013) RA 14, RV 57/22, PA 64/36 (48), PCWP 18, FIck CO/CI 3.7/1.6, PVR 8.1 WU, PA sat 47%   . History of stomach ulcers   . Hypertension   . Hypotension   . Hypothyroidism   . Morbid obesity ([place])   . Myocardial infarction ([place]) 08/2013  . Nocturnal dyspnea   . Nonischemic  cardiomyopathy ([place])   . Sinus tachycardia   . Snoring-prob OSA 09/04/2011  . Sprint Fidelis ICD lead RECALL  [id number]     PAST SURGICAL HISTORY: Past Surgical History:  Procedure Laterality Date  . BREATH TEK H PYLORI N/A 11/09/2014   Procedure: BREATH TEK H PYLORI;  Surgeon: Greer Pickerel, MD;  Location: [facility];  Service: General;  Laterality: N/A;  . CARDIAC CATHETERIZATION  ~ 2006; 09/2013  . CARDIAC CATHETERIZATION N/A 05/23/2016   Procedure: Right Heart Cath;  Surgeon: Larey Dresser, MD;  Location: [facility];  Service: Cardiovascular;   Laterality: N/A;  . CARDIAC DEFIBRILLATOR PLACEMENT  2006; 12/26/2013   Medtronic [id number]; 12-2013 ICD gen change and RV lead revision with new 4628 RV lead by Dr [name]  . CESAREAN SECTION  1999  . IMPLANTABLE CARDIOVERTER DEFIBRILLATOR GENERATOR CHANGE N/A 12/26/2013   Procedure: IMPLANTABLE CARDIOVERTER DEFIBRILLATOR GENERATOR CHANGE;  Surgeon: Deboraha Sprang, MD;  [facility];  Service: Cardiovascular;  Laterality: N/A;  . LEAD REVISION N/A 12/26/2013   Procedure: LEAD REVISION;  Surgeon: Deboraha Sprang, MD;  Location: [facility] CATH LAB;  Service: Cardiovascular;  Laterality: N/A;  . RIGHT HEART CATHETERIZATION N/A 02/15/2014   Procedure: RIGHT HEART CATH;  Surgeon: Larey Dresser, MD;  Location: [facility] CATH LAB;  Service: Cardiovascular;  Laterality: N/A;  . TUBAL LIGATION  1999    SOCIAL HISTORY: Social History   Tobacco Use  . Smoking status: Never Smoker  . Smokeless tobacco: Never Used  Substance Use Topics  . Alcohol use: No  . Drug use: No    FAMILY HISTORY: Family History  Problem Relation Age of Onset  . Heart disease Maternal Grandmother   . Heart disease Mother   . Obesity Mother   . High blood pressure Father   . Stroke Father     ROS: Review of Systems  Constitutional: Positive for weight loss.  Cardiovascular: Negative for chest pain and claudication.  Gastrointestinal: Negative for diarrhea, nausea and vomiting.  Musculoskeletal: Negative for myalgias.       Negative muscle weakness  Endo/Heme/Allergies:       Negative polyphagia Negative hypoglycemia    PHYSICAL EXAM: Blood pressure 104/72, pulse 63, temperature 98 F (36.7 C), temperature source Oral, height 5\' 3"  (1.6 m), weight 255 lb (115.7 kg), last menstrual period 02/20/2018, SpO2 98 %. Body mass index is 45.17 kg/m. Physical Exam  Constitutional: She is oriented to person, place, and time. She appears well-developed and well-nourished.  Cardiovascular: Normal rate.  Pulmonary/Chest:  Effort normal.  Musculoskeletal: Normal range of motion.  Neurological: She is oriented to person, place, and time.  Skin: Skin is warm and dry.  Psychiatric: She has a normal mood and affect. Her behavior is normal.  Vitals reviewed.   RECENT LABS AND TESTS: BMET    Component Value Date/Time   NA 137 03/10/2018 1507   NA 138 09/24/2013 0536   K 4.0 03/10/2018 1507   K 3.7 09/24/2013 0536   CL 97 03/10/2018 1507   CL 107 09/24/2013 0536   CO2 23 03/10/2018 1507   CO2 25 09/24/2013 0536   GLUCOSE 83 03/10/2018 1507   GLUCOSE 88 11/27/2017 1220   GLUCOSE 82 09/24/2013 0536   BUN 10 03/10/2018 1507   BUN 11 09/24/2013 0536   CREATININE 0.96 03/10/2018 1507   CREATININE 1.08 09/24/2013 0536   CALCIUM 9.8 03/10/2018 1507   CALCIUM 8.6 09/24/2013 0536   GFRNONAA 71 03/10/2018 1507  GFRNONAA >60 09/24/2013 0536   GFRAA 81 03/10/2018 1507   GFRAA >60 09/24/2013 0536   Lab Results  Component Value Date   HGBA1C 5.4 03/10/2018   HGBA1C 5.6 10/05/2017   HGBA1C 5.9 09/19/2013   Lab Results  Component Value Date   INSULIN 7.9 03/10/2018   INSULIN 22.5 10/05/2017   CBC    Component Value Date/Time   WBC 8.9 10/05/2017 1218   WBC 10.6 (H) 05/20/2016 1515   RBC 4.84 10/05/2017 1218   RBC 4.64 05/20/2016 1515   HGB 13.6 10/05/2017 1218   HCT 41.7 10/05/2017 1218   PLT 269 05/20/2016 1515   PLT 195 09/23/2013 0443   MCV 86 10/05/2017 1218   MCV 85 09/23/2013 0443   MCH 28.1 10/05/2017 1218   MCH 28.2 05/20/2016 1515   MCHC 32.6 10/05/2017 1218   MCHC 32.8 05/20/2016 1515   RDW 14.5 10/05/2017 1218   RDW 15.4 (H) 09/23/2013 0443   LYMPHSABS 1.9 10/05/2017 1218   LYMPHSABS 2.0 09/23/2013 0443   MONOABS 0.5 12/19/2013 1158   MONOABS 0.4 09/23/2013 0443   EOSABS 0.2 10/05/2017 1218   EOSABS 0.1 09/23/2013 0443   BASOSABS 0.0 10/05/2017 1218   BASOSABS 0.1 09/23/2013 0443   Iron/TIBC/Ferritin/ %Sat No results found for: IRON, TIBC, FERRITIN, IRONPCTSAT Lipid Panel      Component Value Date/Time   CHOL 169 03/10/2018 1507   CHOL 183 09/20/2013 0424   TRIG 74 03/10/2018 1507   TRIG 105 09/20/2013 0424   HDL 55 03/10/2018 1507   HDL 40 09/20/2013 0424   CHOLHDL 3.1 03/10/2018 1507   CHOLHDL 3.3 07/21/2017 0854   VLDL 18 07/21/2017 0854   VLDL 21 09/20/2013 0424   LDLCALC 99 03/10/2018 1507   LDLCALC 122 (H) 09/20/2013 0424   Hepatic Function Panel     Component Value Date/Time   PROT 7.8 03/10/2018 1507   PROT 7.7 09/19/2013 0431   ALBUMIN 4.5 03/10/2018 1507   ALBUMIN 3.5 09/19/2013 0431   AST 17 03/10/2018 1507   AST 36 09/19/2013 0431   ALT 10 03/10/2018 1507   ALT 32 09/19/2013 0431   ALKPHOS 88 03/10/2018 1507   ALKPHOS 65 09/19/2013 0431   BILITOT 0.4 03/10/2018 1507   BILITOT 0.4 09/19/2013 0431   BILIDIR <0.1 (L) 07/21/2017 0854   IBILI NOT CALCULATED 07/21/2017 0854      Component Value Date/Time   TSH 3.680 10/05/2017 1218   TSH 5.995 (H) 11/14/2016 1354   TSH 7.068 (H) 11/10/2016 1040   TSH 2.90 02/29/2008 1132  Results for DEVANEY, SEGERS (MRN [id number]) as of 03/11/2018 11:17  Ref. Range 03/10/2018 15:07  Vitamin D, 25-Hydroxy Latest Ref Range: 30.0 - 100.0 ng/mL 36.0    ASSESSMENT AND PLAN: Vitamin D deficiency - Plan: VITAMIN D 25 Hydroxy (Vit-D Deficiency, Fractures), Vitamin D, Ergocalciferol, (DRISDOL) 50000 units CAPS capsule, VITAMIN D 25 Hydroxy (Vit-D Deficiency, Fractures)  Other hyperlipidemia - Plan: Lipid Panel With LDL/HDL Ratio, Comprehensive metabolic panel, Lipid panel  Insulin resistance - Plan: Comprehensive metabolic panel, Hemoglobin A1c, Insulin, random, Hemoglobin A1c, Insulin, random  At risk for osteoporosis  Class 3 severe obesity with serious comorbidity and body mass index (BMI) of 45.0 to 49.9 in adult, unspecified obesity type ([place])  PLAN:  Vitamin D Deficiency [name] was informed that low vitamin D levels contributes to fatigue and are associated with obesity, breast, and colon  cancer. [name] agrees to continue taking prescription Vit D @50 ,000 IU every week #4  and we will refill for 1 month. She will follow up for routine testing of vitamin D, at least 2-3 times per year. She was informed of the risk of over-replacement of vitamin D and agrees to not increase her dose unless she discusses this with [name] first. We will check labs and [name] agrees to follow up with our clinic in 3 weeks.  At risk for osteopenia and osteoporosis [name] was given extended  (15 minutes) osteoporosis prevention counseling today. [name] is at risk for osteopenia and osteoporsis due to her vitamin D deficiency. She was encouraged to take her vitamin D and follow her higher calcium diet and increase strengthening exercise to help strengthen her bones and decrease her risk of osteopenia and osteoporosis.  Hyperlipidemia [name] was informed of the American Heart Association Guidelines emphasizing intensive lifestyle modifications as the first line treatment for hyperlipidemia. We discussed many lifestyle modifications today in depth, and [name] will continue to work on decreasing saturated fats such as fatty red meat, butter and many fried foods. [name] agrees to continue taking her medications, and she will also increase vegetables and lean protein in her diet, and continue to work on diet, exercise, and weight loss efforts. We will check labs and [name] agrees to follow up with our clinic in 3 weeks.  Insulin Resistance [name] will continue to work on weight loss, diet, exercise, and decreasing simple carbohydrates in her diet to help decrease the risk of diabetes. We dicussed metformin including benefits and risks. She was informed that eating too many simple carbohydrates or too many calories at one sitting increases the likelihood of GI side effects. [name] agrees to continue taking her medications and we will check labs. [name] agrees to follow up with our clinic in 3 weeks as directed to monitor her  progress.  Obesity [name] is currently in the action stage of change. As such, her goal is to continue with weight loss efforts She has agreed to follow the Category 3 plan [name] has been instructed to work up to a goal of 150 minutes of combined cardio and strengthening exercise per week for weight loss and overall health benefits. We discussed the following Behavioral Modification Strategies today: work on meal planning and easy cooking plans and ways to avoid boredom eating   [name] has agreed to follow up with our clinic in 3 weeks. She was informed of the importance of frequent follow up visits to maximize her success with intensive lifestyle modifications for her multiple health conditions.   OBESITY BEHAVIORAL INTERVENTION VISIT  Today's visit was # 10   Starting weight: 277 lbs Starting date: 10/05/17 Today's weight : 255 lbs Today's date: 03/10/2018 Total lbs lost to date: 89    ASK: We discussed the diagnosis of obesity with [name] November today and [name] agreed to give [name] permission to discuss obesity behavioral modification therapy today.  ASSESS: [name] has the diagnosis of obesity and her BMI today is 45.18 [name] is in the action stage of change   ADVISE: Torin was educated on the multiple health risks of obesity as well as the benefit of weight loss to improve her health. She was advised of the need for long term treatment and the importance of lifestyle modifications.  AGREE: Multiple dietary modification options and treatment options were discussed and  [name] agreed to the above obesity treatment plan.  Wilhemena Durie, am acting as transcriptionist for Abby Potash, PA-C I, Abby Potash, PA-C have reviewed above note and agree with  its content

## 2018-04-01 ENCOUNTER — Ambulatory Visit (INDEPENDENT_AMBULATORY_CARE_PROVIDER_SITE_OTHER): Payer: BLUE CROSS/BLUE SHIELD | Admitting: Physician Assistant

## 2018-04-01 ENCOUNTER — Other Ambulatory Visit (HOSPITAL_COMMUNITY): Payer: Self-pay | Admitting: Cardiology

## 2018-04-01 VITALS — BP 99/47 | HR 77 | Temp 98.2°F | Ht 63.0 in | Wt 254.0 lb

## 2018-04-01 DIAGNOSIS — E559 Vitamin D deficiency, unspecified: Secondary | ICD-10-CM | POA: Diagnosis not present

## 2018-04-01 DIAGNOSIS — Z6841 Body Mass Index (BMI) 40.0 and over, adult: Secondary | ICD-10-CM | POA: Diagnosis not present

## 2018-04-01 DIAGNOSIS — Z9189 Other specified personal risk factors, not elsewhere classified: Secondary | ICD-10-CM

## 2018-04-01 DIAGNOSIS — R7303 Prediabetes: Secondary | ICD-10-CM

## 2018-04-01 MED ORDER — METFORMIN HCL 500 MG PO TABS: 500.0000 mg | ORAL_TABLET | Freq: Every day | ORAL | 0 refills | Status: DC

## 2018-04-01 MED ORDER — VITAMIN D (ERGOCALCIFEROL) 1.25 MG (50000 UNIT) PO CAPS: 50000.0000 [IU] | ORAL_CAPSULE | ORAL | 0 refills | Status: DC

## 2018-04-06 ENCOUNTER — Ambulatory Visit (INDEPENDENT_AMBULATORY_CARE_PROVIDER_SITE_OTHER): Payer: BLUE CROSS/BLUE SHIELD | Admitting: *Deleted

## 2018-04-06 ENCOUNTER — Other Ambulatory Visit: Payer: Self-pay

## 2018-04-06 ENCOUNTER — Ambulatory Visit (HOSPITAL_COMMUNITY)
Admission: RE | Admit: 2018-04-06 | Discharge: 2018-04-06 | Disposition: A | Discharge status: Home / Self Care | Payer: BLUE CROSS/BLUE SHIELD | Source: Ambulatory Visit | Attending: Cardiology | Admitting: Cardiology

## 2018-04-06 ENCOUNTER — Encounter (HOSPITAL_COMMUNITY): Payer: Self-pay | Admitting: Cardiology

## 2018-04-06 VITALS — BP 103/60 | HR 83 | Wt 263.2 lb

## 2018-04-06 DIAGNOSIS — E039 Hypothyroidism, unspecified: Secondary | ICD-10-CM | POA: Diagnosis not present

## 2018-04-06 DIAGNOSIS — Z8249 Family history of ischemic heart disease and other diseases of the circulatory system: Secondary | ICD-10-CM | POA: Insufficient documentation

## 2018-04-06 DIAGNOSIS — Z79899 Other long term (current) drug therapy: Secondary | ICD-10-CM | POA: Diagnosis not present

## 2018-04-06 DIAGNOSIS — Z6841 Body Mass Index (BMI) 40.0 and over, adult: Secondary | ICD-10-CM | POA: Diagnosis not present

## 2018-04-06 DIAGNOSIS — Z7984 Long term (current) use of oral hypoglycemic drugs: Secondary | ICD-10-CM | POA: Diagnosis not present

## 2018-04-06 DIAGNOSIS — I428 Other cardiomyopathies: Secondary | ICD-10-CM

## 2018-04-06 DIAGNOSIS — E785 Hyperlipidemia, unspecified: Secondary | ICD-10-CM | POA: Diagnosis not present

## 2018-04-06 DIAGNOSIS — Z8719 Personal history of other diseases of the digestive system: Secondary | ICD-10-CM | POA: Diagnosis not present

## 2018-04-06 DIAGNOSIS — I272 Pulmonary hypertension, unspecified: Secondary | ICD-10-CM

## 2018-04-06 DIAGNOSIS — I110 Hypertensive heart disease with heart failure: Secondary | ICD-10-CM | POA: Insufficient documentation

## 2018-04-06 DIAGNOSIS — I5022 Chronic systolic (congestive) heart failure: Secondary | ICD-10-CM

## 2018-04-06 DIAGNOSIS — Z95810 Presence of automatic (implantable) cardiac defibrillator: Secondary | ICD-10-CM | POA: Insufficient documentation

## 2018-04-06 DIAGNOSIS — I252 Old myocardial infarction: Secondary | ICD-10-CM | POA: Insufficient documentation

## 2018-04-06 LAB — BASIC METABOLIC PANEL
Anion gap: 4 — ABNORMAL LOW (ref 5–15)
BUN: 11 mg/dL (ref 6–20)
CO2: 26 mmol/L (ref 22–32)
Calcium: 8.9 mg/dL (ref 8.9–10.3)
Chloride: 108 mmol/L (ref 98–111)
Creatinine, Ser: 0.85 mg/dL (ref 0.44–1.00)
GFR calc Af Amer: >60 mL/min (ref >60)
GFR calc non Af Amer: >60 mL/min (ref >60)
Glucose, Bld: 93 mg/dL (ref 70–99)
Potassium: 3.6 mmol/L (ref 3.5–5.1)
Sodium: 138 mmol/L (ref 135–145)

## 2018-04-06 LAB — DIGOXIN LEVEL: Digoxin Level: 0.5 ng/mL — ABNORMAL LOW (ref 0.8–2.0)

## 2018-04-06 MED ORDER — FUROSEMIDE 40 MG PO TABS: 60.0000 mg | ORAL_TABLET | Freq: Two times a day (BID) | ORAL | 3 refills | Status: DC

## 2018-04-06 NOTE — Progress Notes (Signed)
Office: [phone]  /  Fax: [phone]   HPI:   Chief Complaint: OBESITY [name] is here to discuss her progress with her obesity treatment plan. She is on the Category 3 plan and is following her eating plan approximately 90 % of the time. She states she is walking and using resistance bands for 30 minutes 3-4 times per week. [place] did well with weight loss. She has been walking more often. She is asking about adding sweet potatoes to her diet.  Her weight is 254 lb (115.2 kg) today and has had a weight loss of 1 pound over a period of 3 weeks since her last visit. She has lost 23 lbs since starting treatment with [name].  Vitamin D Deficiency [name] has a diagnosis of vitamin D deficiency. She is currently taking prescription Vit D and denies nausea, vomiting or muscle weakness.  At risk for osteopenia and osteoporosis [name] is at higher risk of osteopenia and osteoporosis due to vitamin D deficiency.   Insulin Resistance [name] has a diagnosis of insulin resistance based on her elevated fasting insulin level >5. Although [name]'s blood glucose readings are still under good control, insulin resistance puts her at greater risk of metabolic syndrome and diabetes. She denies nausea, vomiting, or diarrhea on metformin and continues to work on diet and exercise to decrease risk of diabetes.  ALLERGIES: No Known Allergies  MEDICATIONS: Current Outpatient Medications on File Prior to Visit  Medication Sig Dispense Refill  . carvedilol (COREG) 25 MG tablet Take 1 tablet (25 mg total) by mouth 2 (two) times daily with a meal. 60 tablet 6  . ENTRESTO 24-26 MG TAKE 1 TABLET BY MOUTH TWICE (2) DAILY 60 tablet 5  . furosemide (LASIX) 40 MG tablet TAKE 2 TABLETS BY MOUTH (8OMG) IN THE MORNING AND TAKE 1 TABLET (40MG ) IN THE EVENING AS INSTRUCTED (Patient taking differently: One tab in the AM and one in the PM) 90 tablet 3  . ivabradine (CORLANOR) 5 MG TABS tablet Take 1 tablet (5 mg total) by mouth 2  (two) times daily with a meal. 60 tablet 6  . potassium chloride SA (K-DUR,KLOR-CON) 20 MEQ tablet TAKE 1 TABLET BY MOUTH ONCE DAILY 60 tablet 6  . spironolactone (ALDACTONE) 25 MG tablet TAKE 1 TABLET BY MOUTH ONCE DAILY 30 tablet 6   No current facility-administered medications on file prior to visit.     PAST MEDICAL HISTORY: Past Medical History:  Diagnosis Date  . Automatic implantable cardioverter-defibrillator in situ   . Chronic CHF (congestive heart failure) (HCC)    a. EF 15-20% b. RHC (09/2013) RA 14, RV 57/22, PA 64/36 (48), PCWP 18, FIck CO/CI 3.7/1.6, PVR 8.1 WU, PA sat 47%   . History of stomach ulcers   . Hypertension   . Hypotension   . Hypothyroidism   . Morbid obesity ([place])   . Myocardial infarction ([place]) 08/2013  . Nocturnal dyspnea   . Nonischemic cardiomyopathy ([place])   . Sinus tachycardia   . Snoring-prob OSA 09/04/2011  . Sprint Fidelis ICD lead RECALL  [id number]     PAST SURGICAL HISTORY: Past Surgical History:  Procedure Laterality Date  . BREATH TEK H PYLORI N/A 11/09/2014   Procedure: BREATH TEK H PYLORI;  Surgeon: Greer Pickerel, MD;  Location: [facility];  Service: General;  Laterality: N/A;  . CARDIAC CATHETERIZATION  ~ 2006; 09/2013  . CARDIAC CATHETERIZATION N/A 05/23/2016   Procedure: Right Heart Cath;  Surgeon: Larey Dresser, MD;  Location: [facility]  INVASIVE CV LAB;  Service: Cardiovascular;  Laterality: N/A;  . CARDIAC DEFIBRILLATOR PLACEMENT  2006; 12/26/2013   Medtronic [id number]; 12-2013 ICD gen change and RV lead revision with new 7001 RV lead by Dr [name]  . CESAREAN SECTION  1999  . IMPLANTABLE CARDIOVERTER DEFIBRILLATOR GENERATOR CHANGE N/A 12/26/2013   Procedure: IMPLANTABLE CARDIOVERTER DEFIBRILLATOR GENERATOR CHANGE;  Surgeon: Deboraha Sprang, MD;  Location: [facility] CATH LAB;  Service: Cardiovascular;  Laterality: N/A;  . LEAD REVISION N/A 12/26/2013   Procedure: LEAD REVISION;  Surgeon: Deboraha Sprang, MD;  Location: [facility] CATH LAB;  Service:  Cardiovascular;  Laterality: N/A;  . RIGHT HEART CATHETERIZATION N/A 02/15/2014   Procedure: RIGHT HEART CATH;  Surgeon: Larey Dresser, MD;  Location: [facility] CATH LAB;  Service: Cardiovascular;  Laterality: N/A;  . TUBAL LIGATION  1999    SOCIAL HISTORY: Social History   Tobacco Use  . Smoking status: Never Smoker  . Smokeless tobacco: Never Used  Substance Use Topics  . Alcohol use: No  . Drug use: No    FAMILY HISTORY: Family History  Problem Relation Age of Onset  . Heart disease Maternal Grandmother   . Heart disease Mother   . Obesity Mother   . High blood pressure Father   . Stroke Father     ROS: Review of Systems  Constitutional: Positive for weight loss.  Gastrointestinal: Negative for diarrhea, nausea and vomiting.  Musculoskeletal:       Negative muscle weakness    PHYSICAL EXAM: Blood pressure (!) 99/47, pulse 77, temperature 98.2 F (36.8 C), temperature source Oral, height 5\' 3"  (1.6 m), weight 254 lb (115.2 kg), last menstrual period 03/29/2018, SpO2 99 %. Body mass index is 44.99 kg/m. Physical Exam  Constitutional: She is oriented to person, place, and time. She appears well-developed and well-nourished.  Cardiovascular: Normal rate.  Pulmonary/Chest: Effort normal.  Musculoskeletal: Normal range of motion.  Neurological: She is oriented to person, place, and time.  Skin: Skin is warm and dry.  Psychiatric: She has a normal mood and affect. Her behavior is normal.  Vitals reviewed.   RECENT LABS AND TESTS: BMET    Component Value Date/Time   NA 137 03/10/2018 1507   NA 138 09/24/2013 0536   K 4.0 03/10/2018 1507   K 3.7 09/24/2013 0536   CL 97 03/10/2018 1507   CL 107 09/24/2013 0536   CO2 23 03/10/2018 1507   CO2 25 09/24/2013 0536   GLUCOSE 83 03/10/2018 1507   GLUCOSE 88 11/27/2017 1220   GLUCOSE 82 09/24/2013 0536   BUN 10 03/10/2018 1507   BUN 11 09/24/2013 0536   CREATININE 0.96 03/10/2018 1507   CREATININE 1.08 09/24/2013 0536     CALCIUM 9.8 03/10/2018 1507   CALCIUM 8.6 09/24/2013 0536   GFRNONAA 71 03/10/2018 1507   GFRNONAA >60 09/24/2013 0536   GFRAA 81 03/10/2018 1507   GFRAA >60 09/24/2013 0536   Lab Results  Component Value Date   HGBA1C 5.4 03/10/2018   HGBA1C 5.6 10/05/2017   HGBA1C 5.9 09/19/2013   Lab Results  Component Value Date   INSULIN 7.9 03/10/2018   INSULIN 22.5 10/05/2017   CBC    Component Value Date/Time   WBC 8.9 10/05/2017 1218   WBC 10.6 (H) 05/20/2016 1515   RBC 4.84 10/05/2017 1218   RBC 4.64 05/20/2016 1515   HGB 13.6 10/05/2017 1218   HCT 41.7 10/05/2017 1218   PLT 269 05/20/2016 1515   PLT 195 09/23/2013 0443  MCV 86 10/05/2017 1218   MCV 85 09/23/2013 0443   MCH 28.1 10/05/2017 1218   MCH 28.2 05/20/2016 1515   MCHC 32.6 10/05/2017 1218   MCHC 32.8 05/20/2016 1515   RDW 14.5 10/05/2017 1218   RDW 15.4 (H) 09/23/2013 0443   LYMPHSABS 1.9 10/05/2017 1218   LYMPHSABS 2.0 09/23/2013 0443   MONOABS 0.5 12/19/2013 1158   MONOABS 0.4 09/23/2013 0443   EOSABS 0.2 10/05/2017 1218   EOSABS 0.1 09/23/2013 0443   BASOSABS 0.0 10/05/2017 1218   BASOSABS 0.1 09/23/2013 0443   Iron/TIBC/Ferritin/ %Sat No results found for: IRON, TIBC, FERRITIN, IRONPCTSAT Lipid Panel     Component Value Date/Time   CHOL 169 03/10/2018 1507   CHOL 183 09/20/2013 0424   TRIG 74 03/10/2018 1507   TRIG 105 09/20/2013 0424   HDL 55 03/10/2018 1507   HDL 40 09/20/2013 0424   CHOLHDL 3.1 03/10/2018 1507   CHOLHDL 3.3 07/21/2017 0854   VLDL 18 07/21/2017 0854   VLDL 21 09/20/2013 0424   LDLCALC 99 03/10/2018 1507   LDLCALC 122 (H) 09/20/2013 0424   Hepatic Function Panel     Component Value Date/Time   PROT 7.8 03/10/2018 1507   PROT 7.7 09/19/2013 0431   ALBUMIN 4.5 03/10/2018 1507   ALBUMIN 3.5 09/19/2013 0431   AST 17 03/10/2018 1507   AST 36 09/19/2013 0431   ALT 10 03/10/2018 1507   ALT 32 09/19/2013 0431   ALKPHOS 88 03/10/2018 1507   ALKPHOS 65 09/19/2013 0431    BILITOT 0.4 03/10/2018 1507   BILITOT 0.4 09/19/2013 0431   BILIDIR <0.1 (L) 07/21/2017 0854   IBILI NOT CALCULATED 07/21/2017 0854      Component Value Date/Time   TSH 3.680 10/05/2017 1218   TSH 5.995 (H) 11/14/2016 1354   TSH 7.068 (H) 11/10/2016 1040   TSH 2.90 02/29/2008 1132   Results for DERRIKA, RUFFALO (MRN [id number]) as of 04/06/2018 08:55  Ref. Range 03/10/2018 15:07  Vitamin D, 25-Hydroxy Latest Ref Range: 30.0 - 100.0 ng/mL 36.0   ASSESSMENT AND PLAN: Vitamin D deficiency - Plan: Vitamin D, Ergocalciferol, (DRISDOL) 50000 units CAPS capsule  Prediabetes - Plan: metFORMIN (GLUCOPHAGE) 500 MG tablet  At risk for osteoporosis  Class 3 severe obesity with serious comorbidity and body mass index (BMI) of 45.0 to 49.9 in adult, unspecified obesity type ([place])  PLAN:  Vitamin D Deficiency [name] was informed that low vitamin D levels contributes to fatigue and are associated with obesity, breast, and colon cancer. [name] agrees to continue taking prescription Vit D @50 ,000 IU every week #4 and we will refill for 1 month. She will follow up for routine testing of vitamin D, at least 2-3 times per year. She was informed of the risk of over-replacement of vitamin D and agrees to not increase her dose unless she discusses this with [name] first. Tamya agrees to follow up with our clinic in 3 weeks.  At risk for osteopenia and osteoporosis [name] was given extended  (15 minutes) osteoporosis prevention counseling today. [name] is at risk for osteopenia and osteoporsis due to her vitamin D deficiency. She was encouraged to take her vitamin D and follow her higher calcium diet and increase strengthening exercise to help strengthen her bones and decrease her risk of osteopenia and osteoporosis.  Insulin Resistance [name] will continue to work on weight loss, exercise, and decreasing simple carbohydrates in her diet to help decrease the risk of diabetes. We dicussed metformin including benefits and  risks. She was informed that eating too many simple carbohydrates or too many calories at one sitting increases the likelihood of GI side effects. Kathleene agrees to continue taking metformin 500 mg q AM #30 and we will refill for 1 month. [name] agrees to follow up with our clinic in 3 weeks as directed to monitor her progress.  Obesity [name] is currently in the action stage of change. As such, her goal is to continue with weight loss efforts She has agreed to follow the Category 3 plan [name] has been instructed to work up to a goal of 150 minutes of combined cardio and strengthening exercise per week for weight loss and overall health benefits. We discussed the following Behavioral Modification Strategies today: work on meal planning and easy cooking plans and planning for success   [name] has agreed to follow up with our clinic in 3 weeks. She was informed of the importance of frequent follow up visits to maximize her success with intensive lifestyle modifications for her multiple health conditions.   OBESITY BEHAVIORAL INTERVENTION VISIT  Today's visit was # 11   Starting weight: 277 lbs Starting date: 10/05/17 Today's weight : 254 lbs Today's date: 04/01/2018 Total lbs lost to date: 23    ASK: We discussed the diagnosis of obesity with [name] today and [name] agreed to give [name] permission to discuss obesity behavioral modification therapy today.  ASSESS: [name] has the diagnosis of obesity and her BMI today is 45.01 [name] is in the action stage of change   ADVISE: [name] was educated on the multiple health risks of obesity as well as the benefit of weight loss to improve her health. She was advised of the need for long term treatment and the importance of lifestyle modifications.  AGREE: Multiple dietary modification options and treatment options were discussed and  [name] agreed to the above obesity treatment plan.  [name], am acting as transcriptionist for Abby Potash, PA-C I, Abby Potash, PA-C have reviewed above note and agree with its content

## 2018-04-06 NOTE — Progress Notes (Signed)
Patient ID: [name], female   DOB: 1971-04-07, 47 y.o.   MRN: [id number]  PCP: [facility] in [place] Cardiology: Dr. [name]  HPI: [name] is a 47 y.o. 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  She was followed for systolic CHF initially in [place]. Apparently her EF recovered to 35-40%. However, she was admitted to [facility] 09/19/13 for worsening dyspnea and CP. Coronaries were reportedly OK on LHC at that time at [facility]. Echo showed EF of 15-20% with four-chamber enlargement with moderate-severe TR/MR. RHC as below. Rheumatological w/u negative.  CPX in 9/16 showed near-normal functional capacity.  Repeat echo in 6/17 shows that EF remains 20%. Repeat CPX in 1/18 was submaximal but probably only mild circulatory limitation.  Echo 8/18 showed EF 25-30%, severe LV dilation.  CPX in 6/19 showed low normal functional capacity.   She returns today for followup of heart failure.  She is being seen in the [facility] weight management clinic.  Her weight is down another 8 lbs. She is feeling better with weight loss but has occasional lightheadedness with standing.  She is more active. No dyspnea walking on flat ground.  She has cut back on Lasix for several days due to orthostatic symptoms but has developed a nocturnal cough.     Medtronic device interrogation: Thoracic impedance falling, fluid index > threshold.   RHC Procedural Findings: RA mean 5 RV 55/8 PA 56/31, mean 40 PCWP mean 24 Oxygen saturations: PA 61% AO 99% Cardiac Output (Fick) 4.3  Cardiac Index (Fick) 2.0 PVR 3.7 WU  RHC 09/24/13  RA 14  RV 57/22  PA 64/36 (48)  PCWP 18  Fick CO/CI: 3.7/1.6  PVR 8.1 WU  Ao sat 95%  PA sat 47%   CPX 06/27/2016  Peak VO2: 13.5 (56.2% predicted peak VO2) VE/VCO2 slope: 33 OUES: 2.16 Peak RER: 0.98  CPX  1/18 Peak VO2 13.5 VE/VCO2 slope 33 RER 0.98 Submaximal study,  mild circulatory limitation, suspect more limited by body habitus.   Echo (8/15) with EF 20%, moderate LV dilation, diffuse hypokinesis, normal RV size with mildly decreased systolic function, mild MR.  Echo (5/16) with EF 20%, spherical LV with diffuse hypokinesis, mild MR.  Echo (6/17) with EF 20%, diffuse hypokinesis, moderate LV dilation, normal RV, mild to moderate MR.  Echo (8/18) with EF 25-30%, severe LV dilation, severe LAE.   Sleep study did not show significant OSA.   RHC (8/15): Mean RA 3 PA 33/11 mean 19 Mean PCWP 8 CI 2.35  Holter (8/15) with < 1% PVCs  CPX 9/16 Peak VO2 16.2 VE/VCO2 slope 23.6 RER 1.25 Normal functional capacity  CPX (6/19): peak VO2 14.3 (83% predicted), VE/VCO2 slope 30 => low normal functional capacity.   Labs: (11/09/13): Dig level 0.5, K 3.6, Creatinine 0.83, pro-BNP 622 (12/19/13): K 3.6, creatinine 0.8 (7/15): K 4.1, creatinine 0.91 (8/15): K 3.9, creatinine 0.91 (10/15): K 4, creatinine 0.93, proBNP 129 (12/15): K 4, creatinine 0.9, digoxin 0.4 (5/16): K 4, creatinine 0.86 (9/16): K 3.6, creatinine 0.86, digoxin < 0.2 (5/17): K 4.1, creatinine 0.95, digoxin 0.3 (5/18): K 3.4, creatinine 0.95, TSH elevated but free T3 and free T4 normal.  (8/18): K 3.9, creatinine 0.87, BNP 108  (11/18): K 3.5, creatinine 1.04, digoxin 0.4 (1/19): LDL 91, HDL 47 (3/19): digoxin 0.7, K 4, creatinine 0.99 (4/19): TSH normal, LDL 95 (9/19): LDL 99, HDL 45, K 4,  creatinine 0.96  SH: Disabled since 2014. Lives her husband and 2 children. Does not drink alcohol or smoke   FH: Grandmother had heart failure   ROS: All systems negative except as listed in HPI, PMH and Problem List.  Past Medical History:  Diagnosis Date  . Automatic implantable cardioverter-defibrillator in situ   . Chronic CHF (congestive heart failure) (HCC)    a. EF 15-20% b. RHC (09/2013) RA 14, RV 57/22, PA 64/36 (48), PCWP 18, FIck CO/CI 3.7/1.6, PVR 8.1 WU, PA sat 47%   . History  of stomach ulcers   . Hypertension   . Hypotension   . Hypothyroidism   . Morbid obesity ([place])   . Myocardial infarction ([place]) 08/2013  . Nocturnal dyspnea   . Nonischemic cardiomyopathy ([place])   . Sinus tachycardia   . Snoring-prob OSA 09/04/2011  . Sprint Fidelis ICD lead RECALL  [phone]     Current Outpatient Medications  Medication Sig Dispense Refill  . atorvastatin (LIPITOR) 40 MG tablet TAKE 1 TABLET BY MOUTH ONCE A DAY 30 tablet 3  . carvedilol (COREG) 25 MG tablet Take 1 tablet (25 mg total) by mouth 2 (two) times daily with a meal. 60 tablet 6  . digoxin (LANOXIN) 0.125 MG tablet TAKE 1 TABLET BY MOUTH ONCE DAILY 30 tablet 3  . ENTRESTO 24-26 MG TAKE 1 TABLET BY MOUTH TWICE (2) DAILY 60 tablet 5  . furosemide (LASIX) 40 MG tablet Take 1.5 tablets (60 mg total) by mouth 2 (two) times daily. 90 tablet 3  . ivabradine (CORLANOR) 5 MG TABS tablet Take 1 tablet (5 mg total) by mouth 2 (two) times daily with a meal. 60 tablet 6  . metFORMIN (GLUCOPHAGE) 500 MG tablet Take 1 tablet (500 mg total) by mouth daily with breakfast. 30 tablet 0  . potassium chloride SA (K-DUR,KLOR-CON) 20 MEQ tablet TAKE 1 TABLET BY MOUTH ONCE DAILY 60 tablet 6  . spironolactone (ALDACTONE) 25 MG tablet TAKE 1 TABLET BY MOUTH ONCE DAILY 30 tablet 6  . Vitamin D, Ergocalciferol, (DRISDOL) 50000 units CAPS capsule Take 1 capsule (50,000 Units total) by mouth every 7 (seven) days. 4 capsule 0   No current facility-administered medications for this encounter.    Vitals:   04/06/18 1142  BP: 103/60  Pulse: 83  SpO2: 100%  Weight: 119.4 kg (263 lb 4 oz)    Wt Readings from Last 3 Encounters:  04/06/18 119.4 kg (263 lb 4 oz)  04/01/18 115.2 kg (254 lb)  03/10/18 115.7 kg (255 lb)     PHYSICAL EXAM: General: NAD Neck: JVP 8 cm, no thyromegaly or thyroid nodule.  Lungs: Clear to auscultation bilaterally with normal respiratory effort. CV: Nondisplaced PMI.  Heart regular S1/S2, no S3/S4, no murmur.  No  peripheral edema.  No carotid bruit.  Normal pedal pulses.  Abdomen: Soft, nontender, no hepatosplenomegaly, no distention.  Skin: Intact without lesions or rashes.  Neurologic: Alert and oriented x 3.  Psych: Normal affect. Extremities: No clubbing or cyanosis.  HEENT: Normal.   ASSESSMENT & PLAN: 1. Chronic systolic HF: Nonischemic cardiomyopathy.  She has a Medtronic ICD.  Cause for cardiomyopathy is uncertain: initially noted peri-partum (after son's birth), so cannot rule out peri-partum CMP.  She was noted at one point to have frequent PVCs but holter showed <1% PVCs.   RHC in 8/15 showed normal filling pressures and preserved cardiac output.  She does not qualify for CRT with narrow QRS.  CPX 6/19 with low normal  functional capacity.  Last echo in 8/18 showed EF 25-30% with severe LV dilation. NYHA class II symptoms.  She cut back on her Lasix recently and is mildly volume overloaded by exam and Optivol.  I will not titrate her meds today with soft BP and occasional positional lightheadedness.  - Increase Lasix to 60 mg bid. BMET today.  - Continue Entresto 24/26mg  BID, no BP room to increase.    - Continue Coreg 25 mg bid and ivabradine 5 mg bid.   - Continue current spironolactone and digoxin, check digoxin level.  - Repeat echo.  2. Pulmonary HTN: Mixed picture with PVR 8.1 on RHC at [facility].  However, repeat study in 8/15 here showed no significant pulmonary hypertension.  3. Morbid obesity: Body mass index is 46.63 kg/m.  She is now in the [employer] management program and her weight is coming down.  4. Hyperlipidemia: Good lipids in 1/19.    Followup in 4 months.  [name] 04/06/2018

## 2018-04-06 NOTE — Patient Instructions (Signed)
Increase Furosemide to 60 mg (1 & 1/2 tabs) Twice daily   Labs done today  Your physician has requested that you have an echocardiogram. Echocardiography is a painless test that uses sound waves to create images of your heart. It provides your doctor with information about the size and shape of your heart and how well your heart's chambers and valves are working. This procedure takes approximately one hour. There are no restrictions for this procedure.  Your physician recommends that you schedule a follow-up appointment in: 4 months

## 2018-04-07 NOTE — Progress Notes (Signed)
Remote ICD transmission.   

## 2018-04-13 ENCOUNTER — Ambulatory Visit (HOSPITAL_COMMUNITY)
Admission: RE | Admit: 2018-04-13 | Discharge: 2018-04-13 | Disposition: A | Discharge status: Home / Self Care | Payer: BLUE CROSS/BLUE SHIELD | Source: Ambulatory Visit | Attending: Cardiology | Admitting: Cardiology

## 2018-04-13 DIAGNOSIS — I5022 Chronic systolic (congestive) heart failure: Secondary | ICD-10-CM | POA: Diagnosis not present

## 2018-04-13 DIAGNOSIS — I252 Old myocardial infarction: Secondary | ICD-10-CM | POA: Insufficient documentation

## 2018-04-13 DIAGNOSIS — I081 Rheumatic disorders of both mitral and tricuspid valves: Secondary | ICD-10-CM | POA: Diagnosis not present

## 2018-04-13 DIAGNOSIS — Z6841 Body Mass Index (BMI) 40.0 and over, adult: Secondary | ICD-10-CM | POA: Insufficient documentation

## 2018-04-13 DIAGNOSIS — I110 Hypertensive heart disease with heart failure: Secondary | ICD-10-CM | POA: Insufficient documentation

## 2018-04-13 NOTE — Progress Notes (Signed)
  Echocardiogram 2D Echocardiogram has been performed.  Merrie Roof F 04/13/2018, 12:01 PM

## 2018-04-16 ENCOUNTER — Telehealth (HOSPITAL_COMMUNITY): Payer: Self-pay

## 2018-04-16 NOTE — Telephone Encounter (Signed)
Pt called with ECHO results EF 20-25% with moderate MR. Pt verbalized understanding

## 2018-04-22 ENCOUNTER — Ambulatory Visit (INDEPENDENT_AMBULATORY_CARE_PROVIDER_SITE_OTHER): Payer: BLUE CROSS/BLUE SHIELD | Admitting: Physician Assistant

## 2018-04-22 ENCOUNTER — Encounter (INDEPENDENT_AMBULATORY_CARE_PROVIDER_SITE_OTHER): Payer: Self-pay | Admitting: Physician Assistant

## 2018-04-22 VITALS — BP 86/56 | HR 75 | Temp 98.1°F | Ht 63.0 in | Wt 256.0 lb

## 2018-04-22 DIAGNOSIS — Z6841 Body Mass Index (BMI) 40.0 and over, adult: Secondary | ICD-10-CM

## 2018-04-22 DIAGNOSIS — R7303 Prediabetes: Secondary | ICD-10-CM | POA: Diagnosis not present

## 2018-04-22 DIAGNOSIS — E8881 Metabolic syndrome: Secondary | ICD-10-CM

## 2018-04-22 DIAGNOSIS — E559 Vitamin D deficiency, unspecified: Secondary | ICD-10-CM | POA: Diagnosis not present

## 2018-04-22 DIAGNOSIS — Z9189 Other specified personal risk factors, not elsewhere classified: Secondary | ICD-10-CM | POA: Diagnosis not present

## 2018-04-22 MED ORDER — METFORMIN HCL 500 MG PO TABS: 500.0000 mg | ORAL_TABLET | Freq: Every day | ORAL | 0 refills | Status: DC

## 2018-04-22 MED ORDER — VITAMIN D (ERGOCALCIFEROL) 1.25 MG (50000 UNIT) PO CAPS: 50000.0000 [IU] | ORAL_CAPSULE | ORAL | 0 refills | Status: DC

## 2018-04-22 NOTE — Progress Notes (Signed)
Office: [phone]  /  Fax: [phone]   HPI:   Chief Complaint: OBESITY [name] is here to discuss her progress with her obesity treatment plan. She is on the Category 3 plan and is following her eating plan approximately 85-90 % of the time. She states she is riding the stationary bike for 30 minutes 3 times per week. [name] reports that she did some emotional eating recently after seeing her Cardiologist and being told that her congestive heart failure has worsened. She is ready to get back on track.  Her weight is 256 lb (116.1 kg) today and has gained 2 pounds since her last visit. She has lost 21 lbs since starting treatment with [name].  Insulin Resistance [name] has a diagnosis of insulin resistance based on her elevated fasting insulin level >5. Although [name]'s blood glucose readings are still under good control, insulin resistance puts her at greater risk of metabolic syndrome and diabetes. She is taking metformin currently and continues to work on diet and exercise to decrease risk of diabetes. She denies polyphagia.  Vitamin D Deficiency [name] has a diagnosis of vitamin D deficiency. She is currently taking prescription Vit D and denies nausea, vomiting or muscle weakness.  At risk for osteopenia and osteoporosis [name] is at higher risk of osteopenia and osteoporosis due to vitamin D deficiency.   ALLERGIES: No Known Allergies  MEDICATIONS: Current Outpatient Medications on File Prior to Visit  Medication Sig Dispense Refill  . atorvastatin (LIPITOR) 40 MG tablet TAKE 1 TABLET BY MOUTH ONCE A DAY 30 tablet 3  . carvedilol (COREG) 25 MG tablet Take 1 tablet (25 mg total) by mouth 2 (two) times daily with a meal. 60 tablet 6  . digoxin (LANOXIN) 0.125 MG tablet TAKE 1 TABLET BY MOUTH ONCE DAILY 30 tablet 3  . ENTRESTO 24-26 MG TAKE 1 TABLET BY MOUTH TWICE (2) DAILY 60 tablet 5  . furosemide (LASIX) 40 MG tablet Take 1.5 tablets (60 mg total) by mouth 2 (two) times daily. 90  tablet 3  . ivabradine (CORLANOR) 5 MG TABS tablet Take 1 tablet (5 mg total) by mouth 2 (two) times daily with a meal. 60 tablet 6  . potassium chloride SA (K-DUR,KLOR-CON) 20 MEQ tablet TAKE 1 TABLET BY MOUTH ONCE DAILY 60 tablet 6  . spironolactone (ALDACTONE) 25 MG tablet TAKE 1 TABLET BY MOUTH ONCE DAILY 30 tablet 6   No current facility-administered medications on file prior to visit.     PAST MEDICAL HISTORY: Past Medical History:  Diagnosis Date  . Automatic implantable cardioverter-defibrillator in situ   . Chronic CHF (congestive heart failure) (HCC)    a. EF 15-20% b. RHC (09/2013) RA 14, RV 57/22, PA 64/36 (48), PCWP 18, FIck CO/CI 3.7/1.6, PVR 8.1 WU, PA sat 47%   . History of stomach ulcers   . Hypertension   . Hypotension   . Hypothyroidism   . Morbid obesity ([place])   . Myocardial infarction ([place]) 08/2013  . Nocturnal dyspnea   . Nonischemic cardiomyopathy ([place])   . Sinus tachycardia   . Snoring-prob OSA 09/04/2011  . Sprint Fidelis ICD lead RECALL  [id number]     PAST SURGICAL HISTORY: Past Surgical History:  Procedure Laterality Date  . BREATH TEK H PYLORI N/A 11/09/2014   Procedure: BREATH TEK H PYLORI;  Surgeon: Greer Pickerel, MD;  Location: [facility];  Service: General;  Laterality: N/A;  . CARDIAC CATHETERIZATION  ~ 2006; 09/2013  . CARDIAC CATHETERIZATION N/A 05/23/2016  Procedure: Right Heart Cath;  Surgeon: Larey Dresser, MD;  Location: [facility];  Service: Cardiovascular;  Laterality: N/A;  . CARDIAC DEFIBRILLATOR PLACEMENT  2006; 12/26/2013   Medtronic [id number]; 12-2013 ICD gen change and RV lead revision with new 9509 RV lead by Dr [name]  . CESAREAN SECTION  1999  . IMPLANTABLE CARDIOVERTER DEFIBRILLATOR GENERATOR CHANGE N/A 12/26/2013   Procedure: IMPLANTABLE CARDIOVERTER DEFIBRILLATOR GENERATOR CHANGE;  Surgeon: Deboraha Sprang, MD;  Location: [facility] CATH LAB;  Service: Cardiovascular;  Laterality: N/A;  . LEAD REVISION N/A 12/26/2013   Procedure:  LEAD REVISION;  Surgeon: Deboraha Sprang, MD;  Location: [facility] CATH LAB;  Service: Cardiovascular;  Laterality: N/A;  . RIGHT HEART CATHETERIZATION N/A 02/15/2014   Procedure: RIGHT HEART CATH;  Surgeon: Larey Dresser, MD;  Location: [facility] CATH LAB;  Service: Cardiovascular;  Laterality: N/A;  . TUBAL LIGATION  1999    SOCIAL HISTORY: Social History   Tobacco Use  . Smoking status: Never Smoker  . Smokeless tobacco: Never Used  Substance Use Topics  . Alcohol use: No  . Drug use: No    FAMILY HISTORY: Family History  Problem Relation Age of Onset  . Heart disease Maternal Grandmother   . Heart disease Mother   . Obesity Mother   . High blood pressure Father   . Stroke Father     ROS: Review of Systems  Constitutional: Negative for weight loss.  Gastrointestinal: Negative for nausea and vomiting.  Musculoskeletal:       Negative muscle weakness  Endo/Heme/Allergies:       Negative polyphagia    PHYSICAL EXAM: Blood pressure (!) 86/56, pulse 75, temperature 98.1 F (36.7 C), temperature source Oral, height 5\' 3"  (1.6 m), weight 256 lb (116.1 kg), last menstrual period 03/29/2018, SpO2 100 %. Body mass index is 45.35 kg/m. Physical Exam  Constitutional: She is oriented to person, place, and time. She appears well-developed and well-nourished.  Cardiovascular: Normal rate.  Pulmonary/Chest: Effort normal.  Musculoskeletal: Normal range of motion.  Neurological: She is oriented to person, place, and time.  Skin: Skin is warm and dry.  Psychiatric: She has a normal mood and affect. Her behavior is normal.  Vitals reviewed.   RECENT LABS AND TESTS: BMET    Component Value Date/Time   NA 138 04/06/2018 1206   NA 137 03/10/2018 1507   NA 138 09/24/2013 0536   K 3.6 04/06/2018 1206   K 3.7 09/24/2013 0536   CL 108 04/06/2018 1206   CL 107 09/24/2013 0536   CO2 26 04/06/2018 1206   CO2 25 09/24/2013 0536   GLUCOSE 93 04/06/2018 1206   GLUCOSE 82 09/24/2013 0536    BUN 11 04/06/2018 1206   BUN 10 03/10/2018 1507   BUN 11 09/24/2013 0536   CREATININE 0.85 04/06/2018 1206   CREATININE 1.08 09/24/2013 0536   CALCIUM 8.9 04/06/2018 1206   CALCIUM 8.6 09/24/2013 0536   GFRNONAA >60 04/06/2018 1206   GFRNONAA >60 09/24/2013 0536   GFRAA >60 04/06/2018 1206   GFRAA >60 09/24/2013 0536   Lab Results  Component Value Date   HGBA1C 5.4 03/10/2018   HGBA1C 5.6 10/05/2017   HGBA1C 5.9 09/19/2013   Lab Results  Component Value Date   INSULIN 7.9 03/10/2018   INSULIN 22.5 10/05/2017   CBC    Component Value Date/Time   WBC 8.9 10/05/2017 1218   WBC 10.6 (H) 05/20/2016 1515   RBC 4.84 10/05/2017 1218   RBC  4.64 05/20/2016 1515   HGB 13.6 10/05/2017 1218   HCT 41.7 10/05/2017 1218   PLT 269 05/20/2016 1515   PLT 195 09/23/2013 0443   MCV 86 10/05/2017 1218   MCV 85 09/23/2013 0443   MCH 28.1 10/05/2017 1218   MCH 28.2 05/20/2016 1515   MCHC 32.6 10/05/2017 1218   MCHC 32.8 05/20/2016 1515   RDW 14.5 10/05/2017 1218   RDW 15.4 (H) 09/23/2013 0443   LYMPHSABS 1.9 10/05/2017 1218   LYMPHSABS 2.0 09/23/2013 0443   MONOABS 0.5 12/19/2013 1158   MONOABS 0.4 09/23/2013 0443   EOSABS 0.2 10/05/2017 1218   EOSABS 0.1 09/23/2013 0443   BASOSABS 0.0 10/05/2017 1218   BASOSABS 0.1 09/23/2013 0443   Iron/TIBC/Ferritin/ %Sat No results found for: IRON, TIBC, FERRITIN, IRONPCTSAT Lipid Panel     Component Value Date/Time   CHOL 169 03/10/2018 1507   CHOL 183 09/20/2013 0424   TRIG 74 03/10/2018 1507   TRIG 105 09/20/2013 0424   HDL 55 03/10/2018 1507   HDL 40 09/20/2013 0424   CHOLHDL 3.1 03/10/2018 1507   CHOLHDL 3.3 07/21/2017 0854   VLDL 18 07/21/2017 0854   VLDL 21 09/20/2013 0424   LDLCALC 99 03/10/2018 1507   LDLCALC 122 (H) 09/20/2013 0424   Hepatic Function Panel     Component Value Date/Time   PROT 7.8 03/10/2018 1507   PROT 7.7 09/19/2013 0431   ALBUMIN 4.5 03/10/2018 1507   ALBUMIN 3.5 09/19/2013 0431   AST 17 03/10/2018  1507   AST 36 09/19/2013 0431   ALT 10 03/10/2018 1507   ALT 32 09/19/2013 0431   ALKPHOS 88 03/10/2018 1507   ALKPHOS 65 09/19/2013 0431   BILITOT 0.4 03/10/2018 1507   BILITOT 0.4 09/19/2013 0431   BILIDIR <0.1 (L) 07/21/2017 0854   IBILI NOT CALCULATED 07/21/2017 0854      Component Value Date/Time   TSH 3.680 10/05/2017 1218   TSH 5.995 (H) 11/14/2016 1354   TSH 7.068 (H) 11/10/2016 1040   TSH 2.90 02/29/2008 1132  Results for ALFRED, ECKLEY (MRN [id number]) as of 04/22/2018 14:10  Ref. Range 03/10/2018 15:07  Vitamin D, 25-Hydroxy Latest Ref Range: 30.0 - 100.0 ng/mL 36.0    ASSESSMENT AND PLAN: Insulin resistance  Vitamin D deficiency - Plan: Vitamin D, Ergocalciferol, (DRISDOL) 50000 units CAPS capsule  Prediabetes - Plan: metFORMIN (GLUCOPHAGE) 500 MG tablet  At risk for osteoporosis  Class 3 severe obesity with serious comorbidity and body mass index (BMI) of 45.0 to 49.9 in adult, unspecified obesity type ([place])  PLAN:  Insulin Resistance Diamonique will continue to work on weight loss, exercise, and decreasing simple carbohydrates in her diet to help decrease the risk of diabetes. We dicussed metformin including benefits and risks. She was informed that eating too many simple carbohydrates or too many calories at one sitting increases the likelihood of GI side effects. Deosha agrees to continue taking metformin 500 mg q AM #30 and we will refill for 1 month. Lotta agrees to follow up with our clinic in 2 weeks as directed to monitor her progress.  Vitamin D Deficiency Santasia was informed that low vitamin D levels contributes to fatigue and are associated with obesity, breast, and colon cancer. Alaya agrees to continue taking prescription Vit D @50 ,000 IU every week #4 and we will refill for 1 month. She will follow up for routine testing of vitamin D, at least 2-3 times per year. She was informed of the risk of over-replacement  of vitamin D and agrees to not increase her dose  unless she discusses this with [name] first. Lorie agrees to follow up with our clinic in 2 weeks.  At risk for osteopenia and osteoporosis Krithi was given extended (15 minutes) osteoporosis prevention counseling today. Maryann is at risk for osteopenia and osteoporsis due to her vitamin D deficiency. She was encouraged to take her vitamin D and follow her higher calcium diet and increase strengthening exercise to help strengthen her bones and decrease her risk of osteopenia and osteoporosis.  Obesity Shandon is currently in the action stage of change. As such, her goal is to continue with weight loss efforts She has agreed to follow the Category 3 plan Jayline has been instructed to work up to a goal of 150 minutes of combined cardio and strengthening exercise per week for weight loss and overall health benefits. We discussed the following Behavioral Modification Strategies today: work on meal planning and easy cooking plans and ways to avoid boredom eating   Florene has agreed to follow up with our clinic in 2 weeks. She was informed of the importance of frequent follow up visits to maximize her success with intensive lifestyle modifications for her multiple health conditions.   OBESITY BEHAVIORAL INTERVENTION VISIT  Today's visit was # 12   Starting weight: 277 lbs Starting date: 10/05/17 Today's weight : 256 lbs Today's date: 04/22/2018 Total lbs lost to date: 21    ASK: We discussed the diagnosis of obesity with [name] today and Makaiah agreed to give [name] permission to discuss obesity behavioral modification therapy today.  ASSESS: Pennelope has the diagnosis of obesity and her BMI today is 45.36 Joia is in the action stage of change   ADVISE: Jannis was educated on the multiple health risks of obesity as well as the benefit of weight loss to improve her health. She was advised of the need for long term treatment and the importance of lifestyle modifications.  AGREE: Multiple dietary  modification options and treatment options were discussed and  Shemica agreed to the above obesity treatment plan.  Wilhemena Durie, am acting as transcriptionist for Abby Potash, PA-C I, Abby Potash, PA-C have reviewed above note and agree with its content

## 2018-04-30 LAB — CUP PACEART REMOTE DEVICE CHECK
Battery Remaining Longevity: 100 mo
Battery Voltage: 3.01 V
Brady Statistic RV Percent Paced: 0.01 %
Date Time Interrogation Session: 20191015073522
HighPow Impedance: 69 Ohm
Implantable Lead Implant Date: 20150706
Implantable Lead Location: 753860
Implantable Pulse Generator Implant Date: 20150706
Lead Channel Impedance Value: 456 Ohm
Lead Channel Impedance Value: 570 Ohm
Lead Channel Pacing Threshold Amplitude: 0.375 V
Lead Channel Pacing Threshold Pulse Width: 0.4 ms
Lead Channel Sensing Intrinsic Amplitude: 10.625 mV
Lead Channel Sensing Intrinsic Amplitude: 10.625 mV
Lead Channel Setting Pacing Amplitude: 2 V
Lead Channel Setting Pacing Pulse Width: 0.4 ms
Lead Channel Setting Sensing Sensitivity: 0.3 mV

## 2018-05-06 ENCOUNTER — Encounter (INDEPENDENT_AMBULATORY_CARE_PROVIDER_SITE_OTHER): Payer: Self-pay | Admitting: Physician Assistant

## 2018-05-06 ENCOUNTER — Ambulatory Visit (INDEPENDENT_AMBULATORY_CARE_PROVIDER_SITE_OTHER): Payer: BLUE CROSS/BLUE SHIELD | Admitting: Physician Assistant

## 2018-05-06 VITALS — BP 106/69 | HR 75 | Temp 98.3°F | Ht 63.0 in | Wt 251.0 lb

## 2018-05-06 DIAGNOSIS — E66813 Obesity, class 3: Secondary | ICD-10-CM

## 2018-05-06 DIAGNOSIS — Z9189 Other specified personal risk factors, not elsewhere classified: Secondary | ICD-10-CM

## 2018-05-06 DIAGNOSIS — E88819 Insulin resistance, unspecified: Secondary | ICD-10-CM

## 2018-05-06 DIAGNOSIS — Z6841 Body Mass Index (BMI) 40.0 and over, adult: Secondary | ICD-10-CM | POA: Diagnosis not present

## 2018-05-06 DIAGNOSIS — E559 Vitamin D deficiency, unspecified: Secondary | ICD-10-CM

## 2018-05-06 DIAGNOSIS — E8881 Metabolic syndrome: Secondary | ICD-10-CM | POA: Diagnosis not present

## 2018-05-06 MED ORDER — VITAMIN D (ERGOCALCIFEROL) 1.25 MG (50000 UNIT) PO CAPS: 50000.0000 [IU] | ORAL_CAPSULE | ORAL | 0 refills | Status: DC

## 2018-05-10 NOTE — Progress Notes (Signed)
Office: [phone]  /  Fax: [phone]   HPI:   Chief Complaint: OBESITY [name] is here to discuss her progress with her obesity treatment plan. She is on the Category 3 plan and is following her eating plan approximately 90 % of the time. She states she is walking for 30 minutes 3 times per week. [name] did very well with weight loss. She reports working hard to get back on track. She is asking about Thanksgiving strategies.  Her weight is 251 lb (113.9 kg) today and has had a weight loss of 5 pounds over a period of 2 weeks since her last visit. She has lost 26 lbs since starting treatment with [name].  Vitamin D Deficiency [name] has a diagnosis of vitamin D deficiency. She is currently taking prescription Vit D and denies nausea, vomiting or muscle weakness.  At risk for osteopenia and osteoporosis [name] is at higher risk of osteopenia and osteoporosis due to vitamin D deficiency.   Insulin Resistance [name] has a diagnosis of insulin resistance based on her elevated fasting insulin level >5. Although [name]'s blood glucose readings are still under good control, insulin resistance puts her at greater risk of metabolic syndrome and diabetes. She denies nausea, vomiting, or diarrhea on metformin and continues to work on diet and exercise to decrease risk of diabetes. She denies polyphagia or hypoglycemia.  ALLERGIES: No Known Allergies  MEDICATIONS: Current Outpatient Medications on File Prior to Visit  Medication Sig Dispense Refill  . atorvastatin (LIPITOR) 40 MG tablet TAKE 1 TABLET BY MOUTH ONCE A DAY 30 tablet 3  . carvedilol (COREG) 25 MG tablet Take 1 tablet (25 mg total) by mouth 2 (two) times daily with a meal. 60 tablet 6  . digoxin (LANOXIN) 0.125 MG tablet TAKE 1 TABLET BY MOUTH ONCE DAILY 30 tablet 3  . ENTRESTO 24-26 MG TAKE 1 TABLET BY MOUTH TWICE (2) DAILY 60 tablet 5  . furosemide (LASIX) 40 MG tablet Take 1.5 tablets (60 mg total) by mouth 2 (two) times daily. 90  tablet 3  . ivabradine (CORLANOR) 5 MG TABS tablet Take 1 tablet (5 mg total) by mouth 2 (two) times daily with a meal. 60 tablet 6  . metFORMIN (GLUCOPHAGE) 500 MG tablet Take 1 tablet (500 mg total) by mouth daily with breakfast. 30 tablet 0  . potassium chloride SA (K-DUR,KLOR-CON) 20 MEQ tablet TAKE 1 TABLET BY MOUTH ONCE DAILY 60 tablet 6  . spironolactone (ALDACTONE) 25 MG tablet TAKE 1 TABLET BY MOUTH ONCE DAILY 30 tablet 6   No current facility-administered medications on file prior to visit.     PAST MEDICAL HISTORY: Past Medical History:  Diagnosis Date  . Automatic implantable cardioverter-defibrillator in situ   . Chronic CHF (congestive heart failure) (HCC)    a. EF 15-20% b. RHC (09/2013) RA 14, RV 57/22, PA 64/36 (48), PCWP 18, FIck CO/CI 3.7/1.6, PVR 8.1 WU, PA sat 47%   . History of stomach ulcers   . Hypertension   . Hypotension   . Hypothyroidism   . Morbid obesity ([place])   . Myocardial infarction ([place]) 08/2013  . Nocturnal dyspnea   . Nonischemic cardiomyopathy ([place])   . Sinus tachycardia   . Snoring-prob OSA 09/04/2011  . Sprint Fidelis ICD lead RECALL  [id number]     PAST SURGICAL HISTORY: Past Surgical History:  Procedure Laterality Date  . BREATH TEK H PYLORI N/A 11/09/2014   Procedure: BREATH TEK H PYLORI;  Surgeon: Greer Pickerel, MD;  Location:  [facility];  Service: General;  Laterality: N/A;  . CARDIAC CATHETERIZATION  ~ 2006; 09/2013  . CARDIAC CATHETERIZATION N/A 05/23/2016   Procedure: Right Heart Cath;  Surgeon: Larey Dresser, MD;  Location: [facility];  Service: Cardiovascular;  Laterality: N/A;  . CARDIAC DEFIBRILLATOR PLACEMENT  2006; 12/26/2013   Medtronic [id number]; 12-2013 ICD gen change and RV lead revision with new 8657 RV lead by Dr [name]  . CESAREAN SECTION  1999  . IMPLANTABLE CARDIOVERTER DEFIBRILLATOR GENERATOR CHANGE N/A 12/26/2013   Procedure: IMPLANTABLE CARDIOVERTER DEFIBRILLATOR GENERATOR CHANGE;  Surgeon: Deboraha Sprang, MD;   Location: [facility] CATH LAB;  Service: Cardiovascular;  Laterality: N/A;  . LEAD REVISION N/A 12/26/2013   Procedure: LEAD REVISION;  Surgeon: Deboraha Sprang, MD;  Location: [facility] CATH LAB;  Service: Cardiovascular;  Laterality: N/A;  . RIGHT HEART CATHETERIZATION N/A 02/15/2014   Procedure: RIGHT HEART CATH;  Surgeon: Larey Dresser, MD;  Location: [facility] CATH LAB;  Service: Cardiovascular;  Laterality: N/A;  . TUBAL LIGATION  1999    SOCIAL HISTORY: Social History   Tobacco Use  . Smoking status: Never Smoker  . Smokeless tobacco: Never Used  Substance Use Topics  . Alcohol use: No  . Drug use: No    FAMILY HISTORY: Family History  Problem Relation Age of Onset  . Heart disease Maternal Grandmother   . Heart disease Mother   . Obesity Mother   . High blood pressure Father   . Stroke Father     ROS: Review of Systems  Constitutional: Positive for weight loss.  Gastrointestinal: Negative for diarrhea, nausea and vomiting.  Musculoskeletal:       Negative muscle weakness  Endo/Heme/Allergies:       Negative polyphagia Negative hypoglycemia    PHYSICAL EXAM: Blood pressure 106/69, pulse 75, temperature 98.3 F (36.8 C), temperature source Oral, height 5\' 3"  (1.6 m), weight 251 lb (113.9 kg), SpO2 100 %. Body mass index is 44.46 kg/m. Physical Exam  Constitutional: She is oriented to person, place, and time. She appears well-developed and well-nourished.  Cardiovascular: Normal rate.  Pulmonary/Chest: Effort normal.  Musculoskeletal: Normal range of motion.  Neurological: She is oriented to person, place, and time.  Skin: Skin is warm and dry.  Psychiatric: She has a normal mood and affect. Her behavior is normal.  Vitals reviewed.   RECENT LABS AND TESTS: BMET    Component Value Date/Time   NA 138 04/06/2018 1206   NA 137 03/10/2018 1507   NA 138 09/24/2013 0536   K 3.6 04/06/2018 1206   K 3.7 09/24/2013 0536   CL 108 04/06/2018 1206   CL 107 09/24/2013 0536   CO2 26  04/06/2018 1206   CO2 25 09/24/2013 0536   GLUCOSE 93 04/06/2018 1206   GLUCOSE 82 09/24/2013 0536   BUN 11 04/06/2018 1206   BUN 10 03/10/2018 1507   BUN 11 09/24/2013 0536   CREATININE 0.85 04/06/2018 1206   CREATININE 1.08 09/24/2013 0536   CALCIUM 8.9 04/06/2018 1206   CALCIUM 8.6 09/24/2013 0536   GFRNONAA >60 04/06/2018 1206   GFRNONAA >60 09/24/2013 0536   GFRAA >60 04/06/2018 1206   GFRAA >60 09/24/2013 0536   Lab Results  Component Value Date   HGBA1C 5.4 03/10/2018   HGBA1C 5.6 10/05/2017   HGBA1C 5.9 09/19/2013   Lab Results  Component Value Date   INSULIN 7.9 03/10/2018   INSULIN 22.5 10/05/2017   CBC    Component Value Date/Time  WBC 8.9 10/05/2017 1218   WBC 10.6 (H) 05/20/2016 1515   RBC 4.84 10/05/2017 1218   RBC 4.64 05/20/2016 1515   HGB 13.6 10/05/2017 1218   HCT 41.7 10/05/2017 1218   PLT 269 05/20/2016 1515   PLT 195 09/23/2013 0443   MCV 86 10/05/2017 1218   MCV 85 09/23/2013 0443   MCH 28.1 10/05/2017 1218   MCH 28.2 05/20/2016 1515   MCHC 32.6 10/05/2017 1218   MCHC 32.8 05/20/2016 1515   RDW 14.5 10/05/2017 1218   RDW 15.4 (H) 09/23/2013 0443   LYMPHSABS 1.9 10/05/2017 1218   LYMPHSABS 2.0 09/23/2013 0443   MONOABS 0.5 12/19/2013 1158   MONOABS 0.4 09/23/2013 0443   EOSABS 0.2 10/05/2017 1218   EOSABS 0.1 09/23/2013 0443   BASOSABS 0.0 10/05/2017 1218   BASOSABS 0.1 09/23/2013 0443   Iron/TIBC/Ferritin/ %Sat No results found for: IRON, TIBC, FERRITIN, IRONPCTSAT Lipid Panel     Component Value Date/Time   CHOL 169 03/10/2018 1507   CHOL 183 09/20/2013 0424   TRIG 74 03/10/2018 1507   TRIG 105 09/20/2013 0424   HDL 55 03/10/2018 1507   HDL 40 09/20/2013 0424   CHOLHDL 3.1 03/10/2018 1507   CHOLHDL 3.3 07/21/2017 0854   VLDL 18 07/21/2017 0854   VLDL 21 09/20/2013 0424   LDLCALC 99 03/10/2018 1507   LDLCALC 122 (H) 09/20/2013 0424   Hepatic Function Panel     Component Value Date/Time   PROT 7.8 03/10/2018 1507    PROT 7.7 09/19/2013 0431   ALBUMIN 4.5 03/10/2018 1507   ALBUMIN 3.5 09/19/2013 0431   AST 17 03/10/2018 1507   AST 36 09/19/2013 0431   ALT 10 03/10/2018 1507   ALT 32 09/19/2013 0431   ALKPHOS 88 03/10/2018 1507   ALKPHOS 65 09/19/2013 0431   BILITOT 0.4 03/10/2018 1507   BILITOT 0.4 09/19/2013 0431   BILIDIR <0.1 (L) 07/21/2017 0854   IBILI NOT CALCULATED 07/21/2017 0854      Component Value Date/Time   TSH 3.680 10/05/2017 1218   TSH 5.995 (H) 11/14/2016 1354   TSH 7.068 (H) 11/10/2016 1040   TSH 2.90 02/29/2008 1132  Results for EBBA, GOLL (MRN [id number]) as of 05/10/2018 11:13  Ref. Range 03/10/2018 15:07  Vitamin D, 25-Hydroxy Latest Ref Range: 30.0 - 100.0 ng/mL 36.0    ASSESSMENT AND PLAN: Vitamin D deficiency - Plan: Vitamin D, Ergocalciferol, (DRISDOL) 1.25 MG (50000 UT) CAPS capsule  Insulin resistance  At risk for osteoporosis  Class 3 severe obesity with serious comorbidity and body mass index (BMI) of 40.0 to 44.9 in adult, unspecified obesity type ([place])  PLAN:  Vitamin D Deficiency Brycelyn was informed that low vitamin D levels contributes to fatigue and are associated with obesity, breast, and colon cancer. Adiana agrees to continue taking prescription Vit D @50 ,000 IU every week #4 and we will refill for 1 month. She will follow up for routine testing of vitamin D, at least 2-3 times per year. She was informed of the risk of over-replacement of vitamin D and agrees to not increase her dose unless she discusses this with [name] first. Alex agrees to follow up with our clinic in 3 weeks.  At risk for osteopenia and osteoporosis Dashawna was given extended (15 minutes) osteoporosis prevention counseling today. Ileane is at risk for osteopenia and osteoporsis due to her vitamin D deficiency. She was encouraged to take her vitamin D and follow her higher calcium diet and increase strengthening exercise to  help strengthen her bones and decrease her risk of osteopenia and  osteoporosis.  Insulin Resistance Dietrich will continue to work on weight loss, diet, exercise, and decreasing simple carbohydrates in her diet to help decrease the risk of diabetes. We dicussed metformin including benefits and risks. She was informed that eating too many simple carbohydrates or too many calories at one sitting increases the likelihood of GI side effects. Amyia agrees to continue taking metformin and she agrees to follow up with our clinic in 3 weeks as directed to monitor her progress.  Obesity Myrta is currently in the action stage of change. As such, her goal is to continue with weight loss efforts She has agreed to follow the Category 3 plan Braylie has been instructed to work up to a goal of 150 minutes of combined cardio and strengthening exercise per week for weight loss and overall health benefits. We discussed the following Behavioral Modification Strategies today: work on meal planning and easy cooking plans and holiday eating strategies    Eliyanah has agreed to follow up with our clinic in 3 weeks. She was informed of the importance of frequent follow up visits to maximize her success with intensive lifestyle modifications for her multiple health conditions.   OBESITY BEHAVIORAL INTERVENTION VISIT  Today's visit was # 13   Starting weight: 277 lbs Starting date: 10/05/17 Today's weight : 251 lbs Today's date: 05/06/2018 Total lbs lost to date: 48    ASK: We discussed the diagnosis of obesity with [name] today and Avya agreed to give [name] permission to discuss obesity behavioral modification therapy today.  ASSESS: Sayre has the diagnosis of obesity and her BMI today is 44.47 Ariya is in the action stage of change   ADVISE: Nalea was educated on the multiple health risks of obesity as well as the benefit of weight loss to improve her health. She was advised of the need for long term treatment and the importance of lifestyle  modifications.  AGREE: Multiple dietary modification options and treatment options were discussed and  Kinsly agreed to the above obesity treatment plan.  Wilhemena Durie, am acting as transcriptionist for Abby Potash, PA-C I, Abby Potash, PA-C have reviewed above note and agree with its content

## 2018-05-27 ENCOUNTER — Ambulatory Visit (INDEPENDENT_AMBULATORY_CARE_PROVIDER_SITE_OTHER): Payer: BLUE CROSS/BLUE SHIELD | Admitting: Physician Assistant

## 2018-05-27 ENCOUNTER — Encounter (INDEPENDENT_AMBULATORY_CARE_PROVIDER_SITE_OTHER): Payer: Self-pay

## 2018-06-14 ENCOUNTER — Ambulatory Visit (INDEPENDENT_AMBULATORY_CARE_PROVIDER_SITE_OTHER): Payer: BLUE CROSS/BLUE SHIELD | Admitting: Physician Assistant

## 2018-06-14 ENCOUNTER — Encounter (INDEPENDENT_AMBULATORY_CARE_PROVIDER_SITE_OTHER): Payer: Self-pay | Admitting: Physician Assistant

## 2018-06-14 VITALS — BP 86/55 | HR 70 | Temp 97.7°F | Ht 63.0 in | Wt 249.0 lb

## 2018-06-14 DIAGNOSIS — Z6841 Body Mass Index (BMI) 40.0 and over, adult: Secondary | ICD-10-CM | POA: Diagnosis not present

## 2018-06-14 DIAGNOSIS — Z9189 Other specified personal risk factors, not elsewhere classified: Secondary | ICD-10-CM | POA: Diagnosis not present

## 2018-06-14 DIAGNOSIS — E559 Vitamin D deficiency, unspecified: Secondary | ICD-10-CM

## 2018-06-14 DIAGNOSIS — R7303 Prediabetes: Secondary | ICD-10-CM

## 2018-06-14 MED ORDER — VITAMIN D (ERGOCALCIFEROL) 1.25 MG (50000 UNIT) PO CAPS: 50000.0000 [IU] | ORAL_CAPSULE | ORAL | 0 refills | Status: DC

## 2018-06-14 MED ORDER — METFORMIN HCL 500 MG PO TABS: 500.0000 mg | ORAL_TABLET | Freq: Every day | ORAL | 0 refills | Status: DC

## 2018-06-14 NOTE — Progress Notes (Signed)
Office: [phone]  /  Fax: [phone]   HPI:   Chief Complaint: OBESITY [name] is here to discuss her progress with her obesity treatment plan. She is on the Category 3 plan and is following her eating plan approximately 50 % of the time. She states she is riding stationary bike for 30 minutes 4 times per week. [name] did very well with weight loss. She reports indulging over the holiday and is ready to get back on track.  Her weight is 249 lb (112.9 kg) today and has had a weight loss of 2 pounds over a period of 5 weeks since her last visit. She has lost 28 lbs since starting treatment with [name].  Insulin Resistance [name] has a diagnosis of insulin resistance based on her elevated fasting insulin level >5. Although [name] blood glucose readings are still under good control, insulin resistance puts her at greater risk of metabolic syndrome and diabetes. She is taking metformin currently and continues to work on diet and exercise to decrease risk of diabetes. She denies polyphagia  At risk for diabetes [name] is at higher than average risk for developing diabetes due to her obesity and insulin resistance. She currently denies polyuria or polydipsia.  Vitamin D Deficiency [name] has a diagnosis of vitamin D deficiency. She is currently taking prescription Vit D and denies nausea, vomiting or muscle weakness.  ALLERGIES: No Known Allergies  MEDICATIONS: Current Outpatient Medications on File Prior to Visit  Medication Sig Dispense Refill  . atorvastatin (LIPITOR) 40 MG tablet TAKE 1 TABLET BY MOUTH ONCE A DAY 30 tablet 3  . carvedilol (COREG) 25 MG tablet Take 1 tablet (25 mg total) by mouth 2 (two) times daily with a meal. 60 tablet 6  . digoxin (LANOXIN) 0.125 MG tablet TAKE 1 TABLET BY MOUTH ONCE DAILY 30 tablet 3  . ENTRESTO 24-26 MG TAKE 1 TABLET BY MOUTH TWICE (2) DAILY 60 tablet 5  . furosemide (LASIX) 40 MG tablet Take 1.5 tablets (60 mg total) by mouth 2 (two) times daily. 90  tablet 3  . ivabradine (CORLANOR) 5 MG TABS tablet Take 1 tablet (5 mg total) by mouth 2 (two) times daily with a meal. 60 tablet 6  . potassium chloride SA (K-DUR,KLOR-CON) 20 MEQ tablet TAKE 1 TABLET BY MOUTH ONCE DAILY 60 tablet 6  . spironolactone (ALDACTONE) 25 MG tablet TAKE 1 TABLET BY MOUTH ONCE DAILY 30 tablet 6   No current facility-administered medications on file prior to visit.     PAST MEDICAL HISTORY: Past Medical History:  Diagnosis Date  . Automatic implantable cardioverter-defibrillator in situ   . Chronic CHF (congestive heart failure) (HCC)    a. EF 15-20% b. RHC (09/2013) RA 14, RV 57/22, PA 64/36 (48), PCWP 18, FIck CO/CI 3.7/1.6, PVR 8.1 WU, PA sat 47%   . History of stomach ulcers   . Hypertension   . Hypotension   . Hypothyroidism   . Morbid obesity ([place])   . Myocardial infarction ([place]) 08/2013  . Nocturnal dyspnea   . Nonischemic cardiomyopathy ([place])   . Sinus tachycardia   . Snoring-prob OSA 09/04/2011  . Sprint Fidelis ICD lead RECALL  [id number]     PAST SURGICAL HISTORY: Past Surgical History:  Procedure Laterality Date  . BREATH TEK H PYLORI N/A 11/09/2014   Procedure: BREATH TEK H PYLORI;  Surgeon: Greer Pickerel, MD;  Location: [facility];  Service: General;  Laterality: N/A;  . CARDIAC CATHETERIZATION  ~ 2006; 09/2013  . CARDIAC  CATHETERIZATION N/A 05/23/2016   Procedure: Right Heart Cath;  Surgeon: Larey Dresser, MD;  Location: [place] [facility];  Service: Cardiovascular;  Laterality: N/A;  . CARDIAC DEFIBRILLATOR PLACEMENT  2006; 12/26/2013   Medtronic [id number]; 12-2013 ICD gen change and RV lead revision with new 9562 RV lead by Dr [name]  . CESAREAN SECTION  1999  . IMPLANTABLE CARDIOVERTER DEFIBRILLATOR GENERATOR CHANGE N/A 12/26/2013   Procedure: IMPLANTABLE CARDIOVERTER DEFIBRILLATOR GENERATOR CHANGE;  Surgeon: Deboraha Sprang, MD;  Location: [facility] CATH LAB;  Service: Cardiovascular;  Laterality: N/A;  . LEAD REVISION N/A 12/26/2013   Procedure:  LEAD REVISION;  Surgeon: Deboraha Sprang, MD;  Location: [facility] CATH LAB;  Service: Cardiovascular;  Laterality: N/A;  . RIGHT HEART CATHETERIZATION N/A 02/15/2014   Procedure: RIGHT HEART CATH;  Surgeon: Larey Dresser, MD;  Location: [facility] CATH LAB;  Service: Cardiovascular;  Laterality: N/A;  . TUBAL LIGATION  1999    SOCIAL HISTORY: Social History   Tobacco Use  . Smoking status: Never Smoker  . Smokeless tobacco: Never Used  Substance Use Topics  . Alcohol use: No  . Drug use: No    FAMILY HISTORY: Family History  Problem Relation Age of Onset  . Heart disease Maternal Grandmother   . Heart disease Mother   . Obesity Mother   . High blood pressure Father   . Stroke Father     ROS: Review of Systems  Constitutional: Positive for weight loss.  Gastrointestinal: Negative for diarrhea, nausea and vomiting.  Genitourinary: Negative for frequency.  Musculoskeletal:       Negative muscle weakness  Endo/Heme/Allergies: Negative for polydipsia.       Negative polyphagia    PHYSICAL EXAM: Blood pressure (!) 86/55, pulse 70, temperature 97.7 F (36.5 C), temperature source Oral, height 5\' 3"  (1.6 m), weight 249 lb (112.9 kg), last menstrual period 05/27/2018, SpO2 99 %. Body mass index is 44.11 kg/m. Physical Exam Vitals signs reviewed.  Constitutional:      Appearance: Normal appearance. She is obese.  Cardiovascular:     Rate and Rhythm: Normal rate.     Pulses: Normal pulses.  Pulmonary:     Effort: Pulmonary effort is normal.  Musculoskeletal: Normal range of motion.  Skin:    General: Skin is warm and dry.  Neurological:     Mental Status: She is alert and oriented to person, place, and time.  Psychiatric:        Mood and Affect: Mood normal.        Behavior: Behavior normal.     RECENT LABS AND TESTS: BMET    Component Value Date/Time   NA 138 04/06/2018 1206   NA 137 03/10/2018 1507   NA 138 09/24/2013 0536   K 3.6 04/06/2018 1206   K 3.7 09/24/2013  0536   CL 108 04/06/2018 1206   CL 107 09/24/2013 0536   CO2 26 04/06/2018 1206   CO2 25 09/24/2013 0536   GLUCOSE 93 04/06/2018 1206   GLUCOSE 82 09/24/2013 0536   BUN 11 04/06/2018 1206   BUN 10 03/10/2018 1507   BUN 11 09/24/2013 0536   CREATININE 0.85 04/06/2018 1206   CREATININE 1.08 09/24/2013 0536   CALCIUM 8.9 04/06/2018 1206   CALCIUM 8.6 09/24/2013 0536   GFRNONAA >60 04/06/2018 1206   GFRNONAA >60 09/24/2013 0536   GFRAA >60 04/06/2018 1206   GFRAA >60 09/24/2013 0536   Lab Results  Component Value Date   HGBA1C 5.4 03/10/2018  HGBA1C 5.6 10/05/2017   HGBA1C 5.9 09/19/2013   Lab Results  Component Value Date   INSULIN 7.9 03/10/2018   INSULIN 22.5 10/05/2017   CBC    Component Value Date/Time   WBC 8.9 10/05/2017 1218   WBC 10.6 (H) 05/20/2016 1515   RBC 4.84 10/05/2017 1218   RBC 4.64 05/20/2016 1515   HGB 13.6 10/05/2017 1218   HCT 41.7 10/05/2017 1218   PLT 269 05/20/2016 1515   PLT 195 09/23/2013 0443   MCV 86 10/05/2017 1218   MCV 85 09/23/2013 0443   MCH 28.1 10/05/2017 1218   MCH 28.2 05/20/2016 1515   MCHC 32.6 10/05/2017 1218   MCHC 32.8 05/20/2016 1515   RDW 14.5 10/05/2017 1218   RDW 15.4 (H) 09/23/2013 0443   LYMPHSABS 1.9 10/05/2017 1218   LYMPHSABS 2.0 09/23/2013 0443   MONOABS 0.5 12/19/2013 1158   MONOABS 0.4 09/23/2013 0443   EOSABS 0.2 10/05/2017 1218   EOSABS 0.1 09/23/2013 0443   BASOSABS 0.0 10/05/2017 1218   BASOSABS 0.1 09/23/2013 0443   Iron/TIBC/Ferritin/ %Sat No results found for: IRON, TIBC, FERRITIN, IRONPCTSAT Lipid Panel     Component Value Date/Time   CHOL 169 03/10/2018 1507   CHOL 183 09/20/2013 0424   TRIG 74 03/10/2018 1507   TRIG 105 09/20/2013 0424   HDL 55 03/10/2018 1507   HDL 40 09/20/2013 0424   CHOLHDL 3.1 03/10/2018 1507   CHOLHDL 3.3 07/21/2017 0854   VLDL 18 07/21/2017 0854   VLDL 21 09/20/2013 0424   LDLCALC 99 03/10/2018 1507   LDLCALC 122 (H) 09/20/2013 0424   Hepatic Function Panel      Component Value Date/Time   PROT 7.8 03/10/2018 1507   PROT 7.7 09/19/2013 0431   ALBUMIN 4.5 03/10/2018 1507   ALBUMIN 3.5 09/19/2013 0431   AST 17 03/10/2018 1507   AST 36 09/19/2013 0431   ALT 10 03/10/2018 1507   ALT 32 09/19/2013 0431   ALKPHOS 88 03/10/2018 1507   ALKPHOS 65 09/19/2013 0431   BILITOT 0.4 03/10/2018 1507   BILITOT 0.4 09/19/2013 0431   BILIDIR <0.1 (L) 07/21/2017 0854   IBILI NOT CALCULATED 07/21/2017 0854      Component Value Date/Time   TSH 3.680 10/05/2017 1218   TSH 5.995 (H) 11/14/2016 1354   TSH 7.068 (H) 11/10/2016 1040   TSH 2.90 02/29/2008 1132    ASSESSMENT AND PLAN: Prediabetes - Plan: metFORMIN (GLUCOPHAGE) 500 MG tablet  Vitamin D deficiency - Plan: Vitamin D, Ergocalciferol, (DRISDOL) 1.25 MG (50000 UT) CAPS capsule  At risk for diabetes mellitus  Class 3 severe obesity with serious comorbidity and body mass index (BMI) of 40.0 to 44.9 in adult, unspecified obesity type ([place])  PLAN:  Insulin Resistance [name] will continue to work on weight loss, exercise, and decreasing simple carbohydrates in her diet to help decrease the risk of diabetes. We dicussed metformin including benefits and risks. She was Clark that eating too many simple carbohydrates or too many calories at one sitting increases the likelihood of GI side effects. [name] to continue taking metformin 500 mg q AM #30 and we will refill for 1 month. [name] Clark to follow up with our clinic in 3 weeks as directed to monitor her progress.  Diabetes risk counselling [name] was given extended (15 minutes) diabetes prevention counseling today. She is 47 y.o. female and has risk factors for diabetes including obesity and insulin resistance. We discussed intensive lifestyle modifications today with an emphasis on  weight loss as well as increasing exercise and decreasing simple carbohydrates in her diet.  Vitamin D Deficiency [name]  contributes to Clark and are associated with obesity, breast, and colon cancer. [name] Clark to continue taking prescription Vit D @50 ,000 IU every week #4 and we will refill for 1 month. She will follow up for routine testing of vitamin D, at least 2-3 times per year. She was Clark of the risk of over-replacement of vitamin D and Clark to not increase her dose unless she discusses this with [name] first. [name] Clark to follow up with our clinic in 3 weeks.  Obesity Alaira is currently in the action stage of change. As such, her goal is to continue with weight loss efforts She has agreed to follow the Category 3 plan Janan has been instructed to work up to a goal of 150 minutes of combined cardio and strengthening exercise per week for weight loss and overall health benefits. We discussed the following Behavioral Modification Strategies today: work on meal planning and easy cooking plans and holiday eating strategies    Yanice has agreed to follow up with our clinic in 3 weeks. She was Clark of the importance of frequent follow up visits to maximize her success with intensive lifestyle modifications for her multiple health conditions.   OBESITY BEHAVIORAL INTERVENTION VISIT  Today's visit was # 14   Starting weight: 277 lbs Starting date: 10/05/17 Today's weight :249 lbs  Today's date: 06/14/2018 Total lbs lost to date: 19    ASK: We discussed the diagnosis of obesity with [name] today and Liandra agreed to give [name] permission to discuss obesity behavioral modification therapy today.  ASSESS: Ersel has the diagnosis of obesity and her BMI today is 44.12 Allissa is in the action stage of change   ADVISE: Lawrence was educated on the multiple health risks of obesity as well as the benefit of weight loss to improve her health. She was advised of the need for long term treatment and the importance of lifestyle modifications.  AGREE: Multiple dietary modification options and treatment  options were discussed and  Kenedie agreed to the above obesity treatment plan.  Epifanio Lesches, PA-C , am acting as transcriptionist for Abby Potash, PA-C I, Abby Potash, PA-C have reviewed above note and agree with its content

## 2018-07-05 ENCOUNTER — Ambulatory Visit (INDEPENDENT_AMBULATORY_CARE_PROVIDER_SITE_OTHER): Payer: BLUE CROSS/BLUE SHIELD | Admitting: Physician Assistant

## 2018-07-05 ENCOUNTER — Encounter (INDEPENDENT_AMBULATORY_CARE_PROVIDER_SITE_OTHER): Payer: Self-pay | Admitting: Physician Assistant

## 2018-07-05 VITALS — BP 105/69 | HR 84 | Temp 97.8°F | Ht 63.0 in | Wt 254.0 lb

## 2018-07-05 DIAGNOSIS — E559 Vitamin D deficiency, unspecified: Secondary | ICD-10-CM

## 2018-07-05 DIAGNOSIS — Z6841 Body Mass Index (BMI) 40.0 and over, adult: Secondary | ICD-10-CM

## 2018-07-05 NOTE — Progress Notes (Signed)
Office: [phone]  /  Fax: [phone]   HPI:   Chief Complaint: OBESITY [name] is here to discuss her progress with her obesity treatment plan. She is on the Category 3 plan and is following her eating plan approximately 50 % of the time. She states she is walking for 20-30 minutes 3 times per week. [name] reports struggling with the plan over the holidays. She is ready to get back on track.  Her weight is 254 lb (115.2 kg) today and has gained 5 pounds since her last visit. She has lost 23 lbs since starting treatment with [name].  Vitamin D Deficiency [name] has a diagnosis of vitamin D deficiency. She is currently taking prescription Vit D and denies nausea, vomiting or muscle weakness.  ALLERGIES: No Known Allergies  MEDICATIONS: Current Outpatient Medications on File Prior to Visit  Medication Sig Dispense Refill  . atorvastatin (LIPITOR) 40 MG tablet TAKE 1 TABLET BY MOUTH ONCE A DAY 30 tablet 3  . carvedilol (COREG) 25 MG tablet Take 1 tablet (25 mg total) by mouth 2 (two) times daily with a meal. 60 tablet 6  . digoxin (LANOXIN) 0.125 MG tablet TAKE 1 TABLET BY MOUTH ONCE DAILY 30 tablet 3  . ENTRESTO 24-26 MG TAKE 1 TABLET BY MOUTH TWICE (2) DAILY 60 tablet 5  . furosemide (LASIX) 40 MG tablet Take 1.5 tablets (60 mg total) by mouth 2 (two) times daily. 90 tablet 3  . ivabradine (CORLANOR) 5 MG TABS tablet Take 1 tablet (5 mg total) by mouth 2 (two) times daily with a meal. 60 tablet 6  . metFORMIN (GLUCOPHAGE) 500 MG tablet Take 1 tablet (500 mg total) by mouth daily with breakfast. 30 tablet 0  . potassium chloride SA (K-DUR,KLOR-CON) 20 MEQ tablet TAKE 1 TABLET BY MOUTH ONCE DAILY 60 tablet 6  . spironolactone (ALDACTONE) 25 MG tablet TAKE 1 TABLET BY MOUTH ONCE DAILY 30 tablet 6  . Vitamin D, Ergocalciferol, (DRISDOL) 1.25 MG (50000 UT) CAPS capsule Take 1 capsule (50,000 Units total) by mouth every 7 (seven) days. 4 capsule 0   No current facility-administered  medications on file prior to visit.     PAST MEDICAL HISTORY: Past Medical History:  Diagnosis Date  . Automatic implantable cardioverter-defibrillator in situ   . Chronic CHF (congestive heart failure) (HCC)    a. EF 15-20% b. RHC (09/2013) RA 14, RV 57/22, PA 64/36 (48), PCWP 18, FIck CO/CI 3.7/1.6, PVR 8.1 WU, PA sat 47%   . History of stomach ulcers   . Hypertension   . Hypotension   . Hypothyroidism   . Morbid obesity ([place])   . Myocardial infarction ([place]) 08/2013  . Nocturnal dyspnea   . Nonischemic cardiomyopathy ([place])   . Sinus tachycardia   . Snoring-prob OSA 09/04/2011  . Sprint Fidelis ICD lead RECALL  [id number]     PAST SURGICAL HISTORY: Past Surgical History:  Procedure Laterality Date  . BREATH TEK H PYLORI N/A 11/09/2014   Procedure: BREATH TEK H PYLORI;  Surgeon: Greer Pickerel, MD;  Location: [facility];  Service: General;  Laterality: N/A;  . CARDIAC CATHETERIZATION  ~ 2006; 09/2013  . CARDIAC CATHETERIZATION N/A 05/23/2016   Procedure: Right Heart Cath;  Surgeon: Larey [facility], MD;  Location: [place] [facility];  Service: Cardiovascular;  Laterality: N/A;  . CARDIAC DEFIBRILLATOR PLACEMENT  2006; 12/26/2013   Medtronic [id number]; 12-2013 ICD gen change and RV lead revision with new 9983 RV lead by Dr [name]  .  CESAREAN SECTION  1999  . IMPLANTABLE CARDIOVERTER DEFIBRILLATOR GENERATOR CHANGE N/A 12/26/2013   Procedure: IMPLANTABLE CARDIOVERTER DEFIBRILLATOR GENERATOR CHANGE;  Surgeon: Deboraha Sprang, MD;  Location: [facility] CATH LAB;  Service: Cardiovascular;  Laterality: N/A;  . LEAD REVISION N/A 12/26/2013   Procedure: LEAD REVISION;  Surgeon: Deboraha Sprang, MD;  Location: [facility] CATH LAB;  Service: Cardiovascular;  Laterality: N/A;  . RIGHT HEART CATHETERIZATION N/A 02/15/2014   Procedure: RIGHT HEART CATH;  Surgeon: Larey Dresser, MD;  Location: [facility] CATH LAB;  Service: Cardiovascular;  Laterality: N/A;  . TUBAL LIGATION  1999    SOCIAL HISTORY: Social History   Tobacco  Use  . Smoking status: Never Smoker  . Smokeless tobacco: Never Used  Substance Use Topics  . Alcohol use: No  . Drug use: No    FAMILY HISTORY: Family History  Problem Relation Age of Onset  . Heart disease Maternal Grandmother   . Heart disease Mother   . Obesity Mother   . High blood pressure Father   . Stroke Father     ROS: Review of Systems  Constitutional: Negative for weight loss.  Gastrointestinal: Negative for nausea and vomiting.  Musculoskeletal:       Negative muscle weakness    PHYSICAL EXAM: Blood pressure 105/69, pulse 84, temperature 97.8 F (36.6 C), temperature source Oral, height 5\' 3"  (1.6 m), weight 254 lb (115.2 kg), SpO2 98 %. Body mass index is 44.99 kg/m. Physical Exam Vitals signs reviewed.  Constitutional:      Appearance: Normal appearance. She is obese.  Cardiovascular:     Rate and Rhythm: Normal rate.     Pulses: Normal pulses.  Pulmonary:     Effort: Pulmonary effort is normal.     Breath sounds: Normal breath sounds.  Musculoskeletal: Normal range of motion.  Skin:    General: Skin is warm and dry.  Neurological:     Mental Status: She is alert and oriented to person, place, and time.  Psychiatric:        Mood and Affect: Mood normal.        Behavior: Behavior normal.     RECENT LABS AND TESTS: BMET    Component Value Date/Time   NA 138 04/06/2018 1206   NA 137 03/10/2018 1507   NA 138 09/24/2013 0536   K 3.6 04/06/2018 1206   K 3.7 09/24/2013 0536   CL 108 04/06/2018 1206   CL 107 09/24/2013 0536   CO2 26 04/06/2018 1206   CO2 25 09/24/2013 0536   GLUCOSE 93 04/06/2018 1206   GLUCOSE 82 09/24/2013 0536   BUN 11 04/06/2018 1206   BUN 10 03/10/2018 1507   BUN 11 09/24/2013 0536   CREATININE 0.85 04/06/2018 1206   CREATININE 1.08 09/24/2013 0536   CALCIUM 8.9 04/06/2018 1206   CALCIUM 8.6 09/24/2013 0536   GFRNONAA >60 04/06/2018 1206   GFRNONAA >60 09/24/2013 0536   GFRAA >60 04/06/2018 1206   GFRAA >60  09/24/2013 0536   Lab Results  Component Value Date   HGBA1C 5.4 03/10/2018   HGBA1C 5.6 10/05/2017   HGBA1C 5.9 09/19/2013   Lab Results  Component Value Date   INSULIN 7.9 03/10/2018   INSULIN 22.5 10/05/2017   CBC    Component Value Date/Time   WBC 8.9 10/05/2017 1218   WBC 10.6 (H) 05/20/2016 1515   RBC 4.84 10/05/2017 1218   RBC 4.64 05/20/2016 1515   HGB 13.6 10/05/2017 1218   HCT 41.7 10/05/2017  1218   PLT 269 05/20/2016 1515   PLT 195 09/23/2013 0443   MCV 86 10/05/2017 1218   MCV 85 09/23/2013 0443   MCH 28.1 10/05/2017 1218   MCH 28.2 05/20/2016 1515   MCHC 32.6 10/05/2017 1218   MCHC 32.8 05/20/2016 1515   RDW 14.5 10/05/2017 1218   RDW 15.4 (H) 09/23/2013 0443   LYMPHSABS 1.9 10/05/2017 1218   LYMPHSABS 2.0 09/23/2013 0443   MONOABS 0.5 12/19/2013 1158   MONOABS 0.4 09/23/2013 0443   EOSABS 0.2 10/05/2017 1218   EOSABS 0.1 09/23/2013 0443   BASOSABS 0.0 10/05/2017 1218   BASOSABS 0.1 09/23/2013 0443   Iron/TIBC/Ferritin/ %Sat No results found for: IRON, TIBC, FERRITIN, IRONPCTSAT Lipid Panel     Component Value Date/Time   CHOL 169 03/10/2018 1507   CHOL 183 09/20/2013 0424   TRIG 74 03/10/2018 1507   TRIG 105 09/20/2013 0424   HDL 55 03/10/2018 1507   HDL 40 09/20/2013 0424   CHOLHDL 3.1 03/10/2018 1507   CHOLHDL 3.3 07/21/2017 0854   VLDL 18 07/21/2017 0854   VLDL 21 09/20/2013 0424   LDLCALC 99 03/10/2018 1507   LDLCALC 122 (H) 09/20/2013 0424   Hepatic Function Panel     Component Value Date/Time   PROT 7.8 03/10/2018 1507   PROT 7.7 09/19/2013 0431   ALBUMIN 4.5 03/10/2018 1507   ALBUMIN 3.5 09/19/2013 0431   AST 17 03/10/2018 1507   AST 36 09/19/2013 0431   ALT 10 03/10/2018 1507   ALT 32 09/19/2013 0431   ALKPHOS 88 03/10/2018 1507   ALKPHOS 65 09/19/2013 0431   BILITOT 0.4 03/10/2018 1507   BILITOT 0.4 09/19/2013 0431   BILIDIR <0.1 (L) 07/21/2017 0854   IBILI NOT CALCULATED 07/21/2017 0854      Component Value  Date/Time   TSH 3.680 10/05/2017 1218   TSH 5.995 (H) 11/14/2016 1354   TSH 7.068 (H) 11/10/2016 1040   TSH 2.90 02/29/2008 1132    ASSESSMENT AND PLAN: Vitamin D deficiency  Class 3 severe obesity with serious comorbidity and body mass index (BMI) of 45.0 to 49.9 in adult, unspecified obesity type ([place])  PLAN:  Vitamin D Deficiency [name] was informed that low vitamin D levels contributes to fatigue and are associated with obesity, breast, and colon cancer. [name] agrees to continue taking prescription Vit D @50 ,000 IU every week and will follow up for routine testing of vitamin D, at least 2-3 times per year. She was informed of the risk of over-replacement of vitamin D and agrees to not increase her dose unless she discusses this with [name] first. [name] agrees to follow up with our clinic in 3 weeks.  I spent > than 50% of the 15 minute visit on counseling as documented in the note.  Obesity [name] is currently in the action stage of change. As such, her goal is to continue with weight loss efforts She has agreed to follow the Category 3 plan [name] has been instructed to work up to a goal of 150 minutes of combined cardio and strengthening exercise per week for weight loss and overall health benefits. We discussed the following Behavioral Modification Strategies today: work on meal planning and easy cooking plans, ways to avoid boredom eating, and planning for success   [name] has agreed to follow up with our clinic in 3 weeks. She was informed of the importance of frequent follow up visits to maximize her success with intensive lifestyle modifications for her multiple health conditions.  OBESITY BEHAVIORAL INTERVENTION VISIT  Today's visit was # 15   Starting weight: 277 lbs Starting date: 10/05/17 Today's weight : 254 lbs  Today's date: 07/05/2018 Total lbs lost to date: 61    ASK: We discussed the diagnosis of obesity with [name] today and [name] agreed to give [name]  permission to discuss obesity behavioral modification therapy today.  ASSESS: [name] has the diagnosis of obesity and her BMI today is 45.01 [name] is in the action stage of change   ADVISE: [name] was educated on the multiple health risks of obesity as well as the benefit of weight loss to improve her health. She was advised of the need for long term treatment and the importance of lifestyle modifications.  AGREE: Multiple dietary modification options and treatment options were discussed and  [name] agreed to the above obesity treatment plan.  Wilhemena Durie, am acting as transcriptionist for Abby Potash, PA-C I, Abby Potash, PA-C have reviewed above note and agree with its content

## 2018-07-06 ENCOUNTER — Ambulatory Visit (INDEPENDENT_AMBULATORY_CARE_PROVIDER_SITE_OTHER): Payer: BLUE CROSS/BLUE SHIELD

## 2018-07-06 DIAGNOSIS — I428 Other cardiomyopathies: Secondary | ICD-10-CM

## 2018-07-06 DIAGNOSIS — I5022 Chronic systolic (congestive) heart failure: Secondary | ICD-10-CM

## 2018-07-07 NOTE — Progress Notes (Signed)
Remote ICD transmission.   

## 2018-07-09 LAB — CUP PACEART REMOTE DEVICE CHECK
Battery Remaining Longevity: 97 mo
Battery Voltage: 3 V
Brady Statistic RV Percent Paced: 0 %
Date Time Interrogation Session: 20200114162231
HighPow Impedance: 64 Ohm
Implantable Lead Implant Date: 20150706
Implantable Lead Location: 753860
Implantable Pulse Generator Implant Date: 20150706
Lead Channel Impedance Value: 437 Ohm
Lead Channel Impedance Value: 513 Ohm
Lead Channel Pacing Threshold Amplitude: 0.5 V
Lead Channel Pacing Threshold Pulse Width: 0.4 ms
Lead Channel Sensing Intrinsic Amplitude: 9.75 mV
Lead Channel Sensing Intrinsic Amplitude: 9.75 mV
Lead Channel Setting Pacing Amplitude: 2 V
Lead Channel Setting Pacing Pulse Width: 0.4 ms
Lead Channel Setting Sensing Sensitivity: 0.3 mV

## 2018-07-27 ENCOUNTER — Encounter (INDEPENDENT_AMBULATORY_CARE_PROVIDER_SITE_OTHER): Payer: Self-pay | Admitting: Physician Assistant

## 2018-07-27 ENCOUNTER — Ambulatory Visit (INDEPENDENT_AMBULATORY_CARE_PROVIDER_SITE_OTHER): Payer: BLUE CROSS/BLUE SHIELD | Admitting: Physician Assistant

## 2018-07-27 VITALS — BP 134/68 | HR 89 | Temp 97.9°F | Ht 63.0 in | Wt 248.0 lb

## 2018-07-27 DIAGNOSIS — R7303 Prediabetes: Secondary | ICD-10-CM

## 2018-07-27 DIAGNOSIS — Z9189 Other specified personal risk factors, not elsewhere classified: Secondary | ICD-10-CM | POA: Diagnosis not present

## 2018-07-27 DIAGNOSIS — E559 Vitamin D deficiency, unspecified: Secondary | ICD-10-CM

## 2018-07-27 DIAGNOSIS — Z6841 Body Mass Index (BMI) 40.0 and over, adult: Secondary | ICD-10-CM

## 2018-07-27 MED ORDER — METFORMIN HCL 500 MG PO TABS: 500.0000 mg | ORAL_TABLET | Freq: Every day | ORAL | 0 refills | Status: DC

## 2018-07-27 MED ORDER — VITAMIN D (ERGOCALCIFEROL) 1.25 MG (50000 UNIT) PO CAPS: 50000.0000 [IU] | ORAL_CAPSULE | ORAL | 0 refills | Status: DC

## 2018-07-27 NOTE — Progress Notes (Signed)
Office: [phone]  /  Fax: [phone]   HPI:   Chief Complaint: OBESITY [name] is here to discuss her progress with her obesity treatment plan. She is on the Category 3 plan and is following her eating plan approximately 90% of the time. She states she is walking 20-30 minutes 3 times per week. [name] did very well with weight loss. She reports that she is getting bored with the meats on the plan.  Her weight is 248 lb (112.5 kg) today and has had a weight loss of 6 pounds over a period of 3 weeks since her last visit. She has lost 29 lbs since starting treatment with [name].  Vitamin D deficiency [name] has a diagnosis of vitamin D deficiency. She is currently taking prescription Vit D and denies nausea, vomiting or muscle weakness.  Insulin Resistance [name] has a diagnosis of insulin resistance based on her elevated fasting insulin level >5. Although [name]'s blood glucose readings are still under good control, insulin resistance puts her at greater risk of metabolic syndrome and diabetes. She is taking metformin currently and continues to work on diet and exercise to decrease risk of diabetes. No nausea, vomiting, or diarrhea. No polyphagia.  At risk for diabetes [name] is at higher than averagerisk for developing diabetes due to her obesity. She currently denies polyuria or polydipsia.  ALLERGIES: No Known Allergies  MEDICATIONS: Current Outpatient Medications on File Prior to Visit  Medication Sig Dispense Refill  . atorvastatin (LIPITOR) 40 MG tablet TAKE 1 TABLET BY MOUTH ONCE A DAY 30 tablet 3  . carvedilol (COREG) 25 MG tablet Take 1 tablet (25 mg total) by mouth 2 (two) times daily with a meal. 60 tablet 6  . digoxin (LANOXIN) 0.125 MG tablet TAKE 1 TABLET BY MOUTH ONCE DAILY 30 tablet 3  . ENTRESTO 24-26 MG TAKE 1 TABLET BY MOUTH TWICE (2) DAILY 60 tablet 5  . furosemide (LASIX) 40 MG tablet Take 1.5 tablets (60 mg total) by mouth 2 (two) times daily. 90 tablet 3  .  ivabradine (CORLANOR) 5 MG TABS tablet Take 1 tablet (5 mg total) by mouth 2 (two) times daily with a meal. 60 tablet 6  . potassium chloride SA (K-DUR,KLOR-CON) 20 MEQ tablet TAKE 1 TABLET BY MOUTH ONCE DAILY 60 tablet 6  . spironolactone (ALDACTONE) 25 MG tablet TAKE 1 TABLET BY MOUTH ONCE DAILY 30 tablet 6   No current facility-administered medications on file prior to visit.     PAST MEDICAL HISTORY: Past Medical History:  Diagnosis Date  . Automatic implantable cardioverter-defibrillator in situ   . Chronic CHF (congestive heart failure) (HCC)    a. EF 15-20% b. RHC (09/2013) RA 14, RV 57/22, PA 64/36 (48), PCWP 18, FIck CO/CI 3.7/1.6, PVR 8.1 WU, PA sat 47%   . History of stomach ulcers   . Hypertension   . Hypotension   . Hypothyroidism   . Morbid obesity ([place])   . Myocardial infarction ([place]) 08/2013  . Nocturnal dyspnea   . Nonischemic cardiomyopathy ([place])   . Sinus tachycardia   . Snoring-prob OSA 09/04/2011  . Sprint Fidelis ICD lead RECALL  [id number]     PAST SURGICAL HISTORY: Past Surgical History:  Procedure Laterality Date  . BREATH TEK H PYLORI N/A 11/09/2014   Procedure: BREATH TEK H PYLORI;  Surgeon: Greer Pickerel, MD;  Location: [facility];  Service: General;  Laterality: N/A;  . CARDIAC CATHETERIZATION  ~ 2006; 09/2013  . CARDIAC CATHETERIZATION N/A 05/23/2016  Procedure: Right Heart Cath;  Surgeon: Larey Dresser, MD;  Location: [facility];  Service: Cardiovascular;  Laterality: N/A;  . CARDIAC DEFIBRILLATOR PLACEMENT  2006; 12/26/2013   Medtronic [id number]; 12-2013 ICD gen change and RV lead revision with new 4081 RV lead by Dr [name]  . CESAREAN SECTION  1999  . IMPLANTABLE CARDIOVERTER DEFIBRILLATOR GENERATOR CHANGE N/A 12/26/2013   Procedure: IMPLANTABLE CARDIOVERTER DEFIBRILLATOR GENERATOR CHANGE;  Surgeon: Deboraha Sprang, MD;  Location: [facility] C[facility] LAB;  Service: Cardiovascular;  Laterality: N/A;  . LEAD REVISION N/A 12/26/2013   Procedure: LEAD REVISION;   Surgeon: Deboraha Sprang, MD;  Location: [facility] CATH LAB;  Service: Cardiovascular;  Laterality: N/A;  . RIGHT HEART CATHETERIZATION N/A 02/15/2014   Procedure: RIGHT HEART CATH;  Surgeon: Larey Dresser, MD;  Location: [facility] CATH LAB;  Service: Cardiovascular;  Laterality: N/A;  . TUBAL LIGATION  1999    SOCIAL HISTORY: Social History   Tobacco Use  . Smoking status: Never Smoker  . Smokeless tobacco: Never Used  Substance Use Topics  . Alcohol use: No  . Drug use: No    FAMILY HISTORY: Family History  Problem Relation Age of Onset  . Heart disease Maternal Grandmother   . Heart disease Mother   . Obesity Mother   . High blood pressure Father   . Stroke Father     ROS: Review of Systems  Constitutional: Positive for weight loss.  Gastrointestinal: Negative for diarrhea, nausea and vomiting.  Musculoskeletal:       Negative muscle weakness  Endo/Heme/Allergies:       Negative polyphagia   PHYSICAL EXAM: Blood pressure 134/68, pulse 89, temperature 97.9 F (36.6 C), temperature source Oral, height 5\' 3"  (1.6 m), weight 248 lb (112.5 kg), SpO2 99 %. Body mass index is 43.93 kg/m. Physical Exam Vitals signs reviewed.  Constitutional:      Appearance: Normal appearance. She is obese.  Cardiovascular:     Rate and Rhythm: Normal rate.     Pulses: Normal pulses.  Pulmonary:     Effort: Pulmonary effort is normal.     Breath sounds: Normal breath sounds.  Musculoskeletal: Normal range of motion.  Skin:    General: Skin is warm and dry.  Neurological:     Mental Status: She is alert and oriented to person, place, and time.  Psychiatric:        Mood and Affect: Mood normal.        Behavior: Behavior normal.   RECENT LABS AND TESTS: BMET    Component Value Date/Time   NA 138 04/06/2018 1206   NA 137 03/10/2018 1507   NA 138 09/24/2013 0536   K 3.6 04/06/2018 1206   K 3.7 09/24/2013 0536   CL 108 04/06/2018 1206   CL 107 09/24/2013 0536   CO2 26 04/06/2018 1206    CO2 25 09/24/2013 0536   GLUCOSE 93 04/06/2018 1206   GLUCOSE 82 09/24/2013 0536   BUN 11 04/06/2018 1206   BUN 10 03/10/2018 1507   BUN 11 09/24/2013 0536   CREATININE 0.85 04/06/2018 1206   CREATININE 1.08 09/24/2013 0536   CALCIUM 8.9 04/06/2018 1206   CALCIUM 8.6 09/24/2013 0536   GFRNONAA >60 04/06/2018 1206   GFRNONAA >60 09/24/2013 0536   GFRAA >60 04/06/2018 1206   GFRAA >60 09/24/2013 0536   Lab Results  Component Value Date   HGBA1C 5.4 03/10/2018   HGBA1C 5.6 10/05/2017   HGBA1C 5.9 09/19/2013   Lab  Results  Component Value Date   INSULIN 7.9 03/10/2018   INSULIN 22.5 10/05/2017   CBC    Component Value Date/Time   WBC 8.9 10/05/2017 1218   WBC 10.6 (H) 05/20/2016 1515   RBC 4.84 10/05/2017 1218   RBC 4.64 05/20/2016 1515   HGB 13.6 10/05/2017 1218   HCT 41.7 10/05/2017 1218   PLT 269 05/20/2016 1515   PLT 195 09/23/2013 0443   MCV 86 10/05/2017 1218   MCV 85 09/23/2013 0443   MCH 28.1 10/05/2017 1218   MCH 28.2 05/20/2016 1515   MCHC 32.6 10/05/2017 1218   MCHC 32.8 05/20/2016 1515   RDW 14.5 10/05/2017 1218   RDW 15.4 (H) 09/23/2013 0443   LYMPHSABS 1.9 10/05/2017 1218   LYMPHSABS 2.0 09/23/2013 0443   MONOABS 0.5 12/19/2013 1158   MONOABS 0.4 09/23/2013 0443   EOSABS 0.2 10/05/2017 1218   EOSABS 0.1 09/23/2013 0443   BASOSABS 0.0 10/05/2017 1218   BASOSABS 0.1 09/23/2013 0443   Iron/TIBC/Ferritin/ %Sat No results found for: IRON, TIBC, FERRITIN, IRONPCTSAT Lipid Panel     Component Value Date/Time   CHOL 169 03/10/2018 1507   CHOL 183 09/20/2013 0424   TRIG 74 03/10/2018 1507   TRIG 105 09/20/2013 0424   HDL 55 03/10/2018 1507   HDL 40 09/20/2013 0424   CHOLHDL 3.1 03/10/2018 1507   CHOLHDL 3.3 07/21/2017 0854   VLDL 18 07/21/2017 0854   VLDL 21 09/20/2013 0424   LDLCALC 99 03/10/2018 1507   LDLCALC 122 (H) 09/20/2013 0424   Hepatic Function Panel     Component Value Date/Time   PROT 7.8 03/10/2018 1507   PROT 7.7 09/19/2013  0431   ALBUMIN 4.5 03/10/2018 1507   ALBUMIN 3.5 09/19/2013 0431   AST 17 03/10/2018 1507   AST 36 09/19/2013 0431   ALT 10 03/10/2018 1507   ALT 32 09/19/2013 0431   ALKPHOS 88 03/10/2018 1507   ALKPHOS 65 09/19/2013 0431   BILITOT 0.4 03/10/2018 1507   BILITOT 0.4 09/19/2013 0431   BILIDIR <0.1 (L) 07/21/2017 0854   IBILI NOT CALCULATED 07/21/2017 0854      Component Value Date/Time   TSH 3.680 10/05/2017 1218   TSH 5.995 (H) 11/14/2016 1354   TSH 7.068 (H) 11/10/2016 1040   TSH 2.90 02/29/2008 1132   Results for CYNDIA, DEGRAFF (MRN [id number]) as of 07/27/2018 14:06  Ref. Range 03/10/2018 15:07  Vitamin D, 25-Hydroxy Latest Ref Range: 30.0 - 100.0 ng/mL 36.0   ASSESSMENT AND PLAN: Prediabetes - Plan: metFORMIN (GLUCOPHAGE) 500 MG tablet  Vitamin D deficiency - Plan: Vitamin D, Ergocalciferol, (DRISDOL) 1.25 MG (50000 UT) CAPS capsule  At risk for diabetes mellitus  Class 3 severe obesity with serious comorbidity and body mass index (BMI) of 40.0 to 44.9 in adult, unspecified obesity type ([place])  PLAN:  Vitamin D Deficiency [name] was informed that low vitamin D levels contributes to fatigue and are associated with obesity, breast, and colon cancer. She agrees to continue to take prescription Vit D @50 ,000 IU every week #4 with no refills. We will follow-up for routine testing of Vitamin D at next visit. She was informed of the risk of over-replacement of Vitamin D and agrees to not increase her dose unless she discusses this with [name] first. [name] agrees to follow-up with our clinic in 3 weeks.  Insulin Resistance [name] will continue to work on weight loss, exercise, and decreasing simple carbohydrates in her diet to help decrease the risk of  diabetes. We dicussed metformin including benefits and risks. She was informed that eating too many simple carbohydrates or too many calories at one sitting increases the likelihood of GI side effects. [name] is currently taking metformin and  prescription was written today for #30 with no refills. We will check labs at next visit. [name] agrees to follow-up with our clinic in 3 weeks.   Diabetes risk counseling [name] was given extended (15 minutes) diabetes prevention counseling today. She is 48 y.o. female and has risk factors for diabetes including obesity. We discussed intensive lifestyle modifications today with an emphasis on weight loss as well as increasing exercise and decreasing simple carbohydrates in her diet.  Obesity [name] is currently in the action stage of change. As such, her goal is to continue with weight loss efforts. She has agreed to follow the Category 3 plan. [name] has been instructed to work up to a goal of 150 minutes of combined cardio and strengthening exercise per week for weight loss and overall health benefits. We discussed the following Behavioral Modification Strategies today: work on meal planning and easy cooking plans and ways to avoid boredom eating.  [name] has agreed to follow up with our clinic in 3 weeks. She was informed of the importance of frequent follow-up visits to maximize her success with intensive lifestyle modifications for her multiple health conditions.   OBESITY BEHAVIORAL INTERVENTION VISIT  Today's visit was #16.  Starting weight: 277 Starting date: 10/05/2017 Today's weight: 248 Today's date: 07/27/2018 Total lbs lost to date: 50  ASK: We discussed the diagnosis of obesity with [name] today and [name] agreed to give [name] permission to discuss obesity behavioral modification therapy today.  ASSESS: [name] has the diagnosis of obesity and her BMI today is 43.93. [name] is in the action stage of change.  ADVISE: [name] was educated on the multiple health risks of obesity as well as the benefit of weight loss to improve her health. She was advised of the need for long term treatment and the importance of lifestyle modifications.  AGREE: Multiple dietary modification  options and treatment options were discussed and  [name] agreed to the above obesity treatment plan.  [name], am acting as transcriptionist for [name] Potash, PA-C I, [name] Potash, PA-C have reviewed above note and agree with its content

## 2018-08-05 DIAGNOSIS — J069 Acute upper respiratory infection, unspecified: Secondary | ICD-10-CM | POA: Diagnosis not present

## 2018-08-05 DIAGNOSIS — R509 Fever, unspecified: Secondary | ICD-10-CM | POA: Diagnosis not present

## 2018-08-05 DIAGNOSIS — R05 Cough: Secondary | ICD-10-CM | POA: Diagnosis not present

## 2018-08-10 ENCOUNTER — Other Ambulatory Visit (HOSPITAL_COMMUNITY): Payer: Self-pay | Admitting: Cardiology

## 2018-08-10 ENCOUNTER — Other Ambulatory Visit (HOSPITAL_COMMUNITY): Payer: Self-pay | Admitting: Internal Medicine

## 2018-08-10 ENCOUNTER — Telehealth (HOSPITAL_COMMUNITY): Payer: Self-pay

## 2018-08-10 ENCOUNTER — Ambulatory Visit (HOSPITAL_COMMUNITY)
Admission: RE | Admit: 2018-08-10 | Discharge: 2018-08-10 | Disposition: A | Discharge status: Home / Self Care | Payer: BLUE CROSS/BLUE SHIELD | Source: Ambulatory Visit | Attending: Cardiology | Admitting: Cardiology

## 2018-08-10 ENCOUNTER — Other Ambulatory Visit: Payer: Self-pay

## 2018-08-10 VITALS — BP 108/0 | HR 88 | Wt 245.4 lb

## 2018-08-10 DIAGNOSIS — I110 Hypertensive heart disease with heart failure: Secondary | ICD-10-CM | POA: Diagnosis not present

## 2018-08-10 DIAGNOSIS — I493 Ventricular premature depolarization: Secondary | ICD-10-CM | POA: Diagnosis not present

## 2018-08-10 DIAGNOSIS — I5022 Chronic systolic (congestive) heart failure: Secondary | ICD-10-CM | POA: Diagnosis not present

## 2018-08-10 DIAGNOSIS — Z6841 Body Mass Index (BMI) 40.0 and over, adult: Secondary | ICD-10-CM | POA: Diagnosis not present

## 2018-08-10 DIAGNOSIS — I272 Pulmonary hypertension, unspecified: Secondary | ICD-10-CM | POA: Diagnosis not present

## 2018-08-10 DIAGNOSIS — Z7984 Long term (current) use of oral hypoglycemic drugs: Secondary | ICD-10-CM | POA: Diagnosis not present

## 2018-08-10 DIAGNOSIS — Z8719 Personal history of other diseases of the digestive system: Secondary | ICD-10-CM | POA: Diagnosis not present

## 2018-08-10 DIAGNOSIS — E039 Hypothyroidism, unspecified: Secondary | ICD-10-CM | POA: Diagnosis not present

## 2018-08-10 DIAGNOSIS — E785 Hyperlipidemia, unspecified: Secondary | ICD-10-CM | POA: Diagnosis not present

## 2018-08-10 DIAGNOSIS — I428 Other cardiomyopathies: Secondary | ICD-10-CM | POA: Insufficient documentation

## 2018-08-10 DIAGNOSIS — I252 Old myocardial infarction: Secondary | ICD-10-CM | POA: Diagnosis not present

## 2018-08-10 DIAGNOSIS — Z95810 Presence of automatic (implantable) cardiac defibrillator: Secondary | ICD-10-CM | POA: Insufficient documentation

## 2018-08-10 DIAGNOSIS — Z79899 Other long term (current) drug therapy: Secondary | ICD-10-CM | POA: Diagnosis not present

## 2018-08-10 DIAGNOSIS — Z8249 Family history of ischemic heart disease and other diseases of the circulatory system: Secondary | ICD-10-CM | POA: Diagnosis not present

## 2018-08-10 LAB — BASIC METABOLIC PANEL
Anion gap: 11 (ref 5–15)
BUN: 8 mg/dL (ref 6–20)
CO2: 24 mmol/L (ref 22–32)
Calcium: 8.9 mg/dL (ref 8.9–10.3)
Chloride: 103 mmol/L (ref 98–111)
Creatinine, Ser: 0.98 mg/dL (ref 0.44–1.00)
GFR calc Af Amer: >60 mL/min (ref >60)
GFR calc non Af Amer: >60 mL/min (ref >60)
Glucose, Bld: 95 mg/dL (ref 70–99)
Potassium: 3 mmol/L — ABNORMAL LOW (ref 3.5–5.1)
Sodium: 138 mmol/L (ref 135–145)

## 2018-08-10 LAB — DIGOXIN LEVEL: Digoxin Level: 0.7 ng/mL — ABNORMAL LOW (ref 0.8–2.0)

## 2018-08-10 MED ORDER — POTASSIUM CHLORIDE CRYS ER 20 MEQ PO TBCR: 40.0000 meq | EXTENDED_RELEASE_TABLET | Freq: Every day | ORAL | 6 refills | Status: DC

## 2018-08-10 MED ORDER — DOXYCYCLINE HYCLATE 100 MG PO CAPS: 100.0000 mg | ORAL_CAPSULE | Freq: Two times a day (BID) | ORAL | 0 refills | Status: DC

## 2018-08-10 MED ORDER — POTASSIUM CHLORIDE CRYS ER 20 MEQ PO TBCR: 20.0000 meq | EXTENDED_RELEASE_TABLET | Freq: Every day | ORAL | 6 refills | Status: DC

## 2018-08-10 MED ORDER — DOXYCYCLINE HYCLATE 100 MG PO CAPS: 100.0000 mg | ORAL_CAPSULE | Freq: Two times a day (BID) | ORAL | 0 refills | Status: AC

## 2018-08-10 NOTE — Progress Notes (Signed)
Patient ID: [name], female   DOB: June 08, 1971, 48 y.o.   MRN: [id number]  PCP: [facility] in [place] Cardiology: Dr. [name]  HPI: [name] is a 48 y.o. 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  She was followed for systolic CHF initially in [place]. Apparently her EF recovered to 35-40%. However, she was admitted to [facility] 09/19/13 for worsening dyspnea and CP. Coronaries were reportedly OK on LHC at that time at [facility]. Echo showed EF of 15-20% with four-chamber enlargement with moderate-severe TR/MR. RHC as below. Rheumatological w/u negative.  CPX in 9/16 showed near-normal functional capacity.  Repeat echo in 6/17 shows that EF remains 20%. Repeat CPX in 1/18 was submaximal but probably only mild circulatory limitation.  Echo 8/18 showed EF 25-30%, severe LV dilation.  CPX in 6/19 showed low normal functional capacity.  Echo in 10/19 showed EF 20-25%, moderate LV dilation.   She returns today for followup of heart failure.  Weight is down another 18 lbs with dieting.  No dyspnea walking on flat ground, gets short of breath with heavier activity.  She has slept with the head of her bed elevated chronically.  Dyspnea with stairs.  No palpitations but frequent PVCs noted on ECG today.      Medtronic device interrogation: Fluid index approaching threshold. No VT.    ECG (personally reviewed): NSR, PVCs, IVCD 132 msec  RHC Procedural Findings: RA mean 5 RV 55/8 PA 56/31, mean 40 PCWP mean 24 Oxygen saturations: PA 61% AO 99% Cardiac Output (Fick) 4.3  Cardiac Index (Fick) 2.0 PVR 3.7 WU  RHC 09/24/13  RA 14  RV 57/22  PA 64/36 (48)  PCWP 18  Fick CO/CI: 3.7/1.6  PVR 8.1 WU  Ao sat 95%  PA sat 47%   CPX 06/27/2016  Peak VO2: 13.5 (56.2% predicted peak VO2) VE/VCO2 slope: 33 OUES: 2.16 Peak RER: 0.98  CPX  1/18 Peak VO2 13.5 VE/VCO2 slope 33 RER  0.98 Submaximal study, mild circulatory limitation, suspect more limited by body habitus.   Echo (8/15) with EF 20%, moderate LV dilation, diffuse hypokinesis, normal RV size with mildly decreased systolic function, mild MR.  Echo (5/16) with EF 20%, spherical LV with diffuse hypokinesis, mild MR.  Echo (6/17) with EF 20%, diffuse hypokinesis, moderate LV dilation, normal RV, mild to moderate MR.  Echo (8/18) with EF 25-30%, severe LV dilation, severe LAE.  Echo (2/20) with EF 20-25%, moderate LV dilation, moderate MR.   Sleep study did not show significant OSA.   RHC (8/15): Mean RA 3 PA 33/11 mean 19 Mean PCWP 8 CI 2.35  Holter (8/15) with < 1% PVCs  CPX 9/16 Peak VO2 16.2 VE/VCO2 slope 23.6 RER 1.25 Normal functional capacity  CPX (6/19): peak VO2 14.3 (83% predicted), VE/VCO2 slope 30 => low normal functional capacity.   Labs: (11/09/13): Dig level 0.5, K 3.6, Creatinine 0.83, pro-BNP 622 (12/19/13): K 3.6, creatinine 0.8 (7/15): K 4.1, creatinine 0.91 (8/15): K 3.9, creatinine 0.91 (10/15): K 4, creatinine 0.93, proBNP 129 (12/15): K 4, creatinine 0.9, digoxin 0.4 (5/16): K 4, creatinine 0.86 (9/16): K 3.6, creatinine 0.86, digoxin < 0.2 (5/17): K 4.1, creatinine 0.95, digoxin 0.3 (5/18): K 3.4, creatinine 0.95, TSH elevated but free T3 and free T4 normal.  (8/18): K 3.9, creatinine 0.87, BNP 108  (11/18): K 3.5, creatinine 1.04, digoxin 0.4 (1/19): LDL 91, HDL 47 (3/19): digoxin  0.7, K 4, creatinine 0.99 (4/19): TSH normal, LDL 95 (9/19): LDL 99, HDL 45, K 4, creatinine 0.96 (10/19): K 3.6, creatinine 0.85, digoxin 0.5  SH: Disabled since 2014. Lives her husband and 2 children. Does not drink alcohol or smoke   FH: Grandmother had heart failure   ROS: All systems negative except as listed in HPI, PMH and Problem List.  Past Medical History:  Diagnosis Date  . Automatic implantable cardioverter-defibrillator in situ   . Chronic CHF (congestive heart failure)  (HCC)    a. EF 15-20% b. RHC (09/2013) RA 14, RV 57/22, PA 64/36 (48), PCWP 18, FIck CO/CI 3.7/1.6, PVR 8.1 WU, PA sat 47%   . History of stomach ulcers   . Hypertension   . Hypotension   . Hypothyroidism   . Morbid obesity ([place])   . Myocardial infarction ([place]) 08/2013  . Nocturnal dyspnea   . Nonischemic cardiomyopathy ([place])   . Sinus tachycardia   . Snoring-prob OSA 09/04/2011  . Sprint Fidelis ICD lead RECALL  [phone]     Current Outpatient Medications  Medication Sig Dispense Refill  . atorvastatin (LIPITOR) 40 MG tablet TAKE 1 TABLET BY MOUTH ONCE A DAY 30 tablet 3  . digoxin (LANOXIN) 0.125 MG tablet TAKE 1 TABLET BY MOUTH ONCE DAILY 30 tablet 3  . ENTRESTO 24-26 MG TAKE 1 TABLET BY MOUTH TWICE (2) DAILY 60 tablet 5  . furosemide (LASIX) 40 MG tablet Take 1.5 tablets (60 mg total) by mouth 2 (two) times daily. 90 tablet 3  . ivabradine (CORLANOR) 5 MG TABS tablet Take 1 tablet (5 mg total) by mouth 2 (two) times daily with a meal. 60 tablet 6  . metFORMIN (GLUCOPHAGE) 500 MG tablet Take 1 tablet (500 mg total) by mouth daily with breakfast. 30 tablet 0  . potassium chloride SA (K-DUR,KLOR-CON) 20 MEQ tablet Take 2 tablets (40 mEq total) by mouth daily. 60 tablet 6  . Vitamin D, Ergocalciferol, (DRISDOL) 1.25 MG (50000 UT) CAPS capsule Take 1 capsule (50,000 Units total) by mouth every 7 (seven) days. 4 capsule 0  . carvedilol (COREG) 25 MG tablet TAKE 1 TABLET BY MOUTH TWICE A DAY WITH A MEAL 60 tablet 6  . doxycycline (VIBRAMYCIN) 100 MG capsule Take 1 capsule (100 mg total) by mouth 2 (two) times daily for 7 days. 14 capsule 0  . spironolactone (ALDACTONE) 25 MG tablet TAKE 1 TABLET BY MOUTH ONCE A DAY 30 tablet 6   No current facility-administered medications for this encounter.    Vitals:   08/10/18 0923  BP: (!) 108/0  Pulse: 88  SpO2: 98%  Weight: 111.3 kg (245 lb 6.4 oz)    Wt Readings from Last 3 Encounters:  08/10/18 111.3 kg (245 lb 6.4 oz)  07/27/18 112.5 kg (248  lb)  07/05/18 115.2 kg (254 lb)     PHYSICAL EXAM: General: NAD Neck: JVP 8 cm, no thyromegaly or thyroid nodule.  Lungs: Clear to auscultation bilaterally with normal respiratory effort. CV: Nondisplaced PMI.  Heart regular S1/S2, no S3/S4, no murmur.  No peripheral edema.  No carotid bruit.  Normal pedal pulses.  Abdomen: Soft, nontender, no hepatosplenomegaly, no distention.  Skin: Intact without lesions or rashes.  Neurologic: Alert and oriented x 3.  Psych: Normal affect. Extremities: No clubbing or cyanosis.  HEENT: Normal.   ASSESSMENT & PLAN: 1. Chronic systolic HF: Nonischemic cardiomyopathy.  She has a Medtronic ICD.  Cause for cardiomyopathy is uncertain: initially noted peri-partum (after son's birth),  so cannot rule out peri-partum CMP.  She was noted at one point to have frequent PVCs but holter showed <1% PVCs.   RHC in 8/15 showed normal filling pressures and preserved cardiac output.  She does not qualify for CRT with narrow QRS.  CPX 6/19 with low normal functional capacity.  Last echo in 10/19 showed EF 20-25% with moderate LV dilation and moderate MR. NYHA class II symptoms.  She does not look significantly volume overloaded on exam but on device interrogation, fluid index approaching threshold. - Increase Lasix to 80 mg bid x 3 days then back to 60 mg bid. BMET today.  - Continue Entresto 24/26mg  BID, no BP room to increase.    - Continue Coreg 25 mg bid and ivabradine 5 mg bid.   - Continue current spironolactone and digoxin, check digoxin level.  2. Pulmonary HTN: Mixed picture with PVR 8.1 on RHC at [facility].  However, repeat study in 8/15 here showed no significant pulmonary hypertension.  3. Morbid obesity: Body mass index is 43.47 kg/m.  Her weight is coming down with diet.  4. Hyperlipidemia: Good lipids in 9/19.   5. PVCs: Frequent PVCs.   - Quantify with 3 day Zio patch.  If high percentage, could contribute to cardiomyopathy.   Followup in 4 months.  [name] 08/10/2018

## 2018-08-10 NOTE — Patient Instructions (Addendum)
Labs today We will only contact you if something comes back abnormal or we need to make some changes. Otherwise no news is good news!  START Doxycycline 100mg  (1 capsule) twice a day  TAKE Furosemide 80mg  (2 tabs) twice a day for 3 days, THEN resume normal dose of 60mg  twice a day  Your physician recommends that you schedule a follow-up appointment in: 4 months with Dr. [name]  Your provider has recommended that  you wear a Zio Patch for 3 days days.  This monitor will record your heart rhythm for our review.  IF you have any symptoms while wearing the monitor please press the button.  If you have any issues with the patch or you notice a red or orange light on it please call the company at [phone].  Once you remove the patch please mail it back to the company as soon as possible so we can get the results.

## 2018-08-10 NOTE — Telephone Encounter (Signed)
-----   Message from Larey Dresser, MD sent at 08/10/2018  2:42 PM EST ----- Add an additional 20 mEq KCl daily to her regimen, needs BMET 10 days

## 2018-08-10 NOTE — Telephone Encounter (Signed)
Relayed results to pt who is agreeable and verbalized understanding for adding 47meq to daily reg, made follow up labs for 3 Mar @830am 

## 2018-08-17 ENCOUNTER — Other Ambulatory Visit (INDEPENDENT_AMBULATORY_CARE_PROVIDER_SITE_OTHER): Payer: Self-pay | Admitting: Physician Assistant

## 2018-08-17 ENCOUNTER — Encounter (INDEPENDENT_AMBULATORY_CARE_PROVIDER_SITE_OTHER): Payer: Self-pay | Admitting: Physician Assistant

## 2018-08-17 ENCOUNTER — Ambulatory Visit (INDEPENDENT_AMBULATORY_CARE_PROVIDER_SITE_OTHER): Payer: BLUE CROSS/BLUE SHIELD | Admitting: Physician Assistant

## 2018-08-17 VITALS — BP 73/58 | HR 91 | Temp 98.9°F | Ht 63.0 in | Wt 249.0 lb

## 2018-08-17 DIAGNOSIS — E8881 Metabolic syndrome: Secondary | ICD-10-CM

## 2018-08-17 DIAGNOSIS — E7849 Other hyperlipidemia: Secondary | ICD-10-CM | POA: Diagnosis not present

## 2018-08-17 DIAGNOSIS — E559 Vitamin D deficiency, unspecified: Secondary | ICD-10-CM

## 2018-08-17 DIAGNOSIS — Z6841 Body Mass Index (BMI) 40.0 and over, adult: Secondary | ICD-10-CM

## 2018-08-17 DIAGNOSIS — R739 Hyperglycemia, unspecified: Secondary | ICD-10-CM | POA: Diagnosis not present

## 2018-08-17 DIAGNOSIS — Z9189 Other specified personal risk factors, not elsewhere classified: Secondary | ICD-10-CM | POA: Diagnosis not present

## 2018-08-17 NOTE — Progress Notes (Signed)
Office: [phone]  /  Fax: [phone]   HPI:   Chief Complaint: OBESITY [name] is here to discuss her progress with her obesity treatment plan. She is on the Category 3 plan and is following her eating plan approximately 90 % of the time. She states she is exercising 0 minutes 0 times per week. [name] saw her cardiologist last week due to retaining fluid. She continues to retain fluid and is on extra lasix. She was unable to eat all of the food on the plan due to having bronchitis.   Her weight is 249 lb (112.9 kg) today and has gained 1 lbs since her last visit. She has lost 28 lbs since starting treatment with [name].  Hyperlipidemia [name] has hyperlipidemia and has been trying to improve her cholesterol levels with intensive lifestyle modification including a low saturated fat diet, exercise and weight loss. She is on Atorvastatin. She denies any chest pain.   Vitamin D deficiency [name] has a diagnosis of vitamin D deficiency. She is currently taking prescription Vit D and denies nausea, vomiting or muscle weakness.  Insulin Resistance [name] has a diagnosis of insulin resistance based on her elevated fasting insulin level >5. Although [name]'s blood glucose readings are still under good control, insulin resistance puts her at greater risk of metabolic syndrome and diabetes. She is taking metformin and denies nausea, vomiting or diarrhea. [name] continues to work on diet and exercise to decrease risk of diabetes. She denies polyphagia.  At risk for cardiovascular disease [name] is at a higher than average risk for cardiovascular disease due to obesity. She currently denies any chest pain.  ASSESSMENT AND PLAN:  Other hyperlipidemia - Plan: Lipid Panel With LDL/HDL Ratio  Vitamin D deficiency - Plan: VITAMIN D 25 Hydroxy (Vit-D Deficiency, Fractures)  Insulin resistance - Plan: Hemoglobin A1c, Insulin, random  At risk for heart disease  Class 3 severe obesity with serious comorbidity  and body mass index (BMI) of 40.0 to 44.9 in adult, unspecified obesity type ([name])  PLAN:  Hyperlipidemia [name] was informed of the American Heart Association Guidelines emphasizing intensive lifestyle modifications as the first line treatment for hyperlipidemia. We discussed many lifestyle modifications today in depth, and Lanice will continue to work on decreasing saturated fats such as fatty red meat, butter and many fried foods. She will also increase vegetables and lean protein in her diet and continue to work on exercise. She will continue taking her medication and continue with weight loss efforts. We will check labs today. Inessa agrees to follow up with our clinic in 3 weeks.  Vitamin D Deficiency [name] was informed that low vitamin D levels contributes to fatigue and are associated with obesity, breast, and colon cancer. [name] agrees to continue to take prescription Vit D 50,000 IU every week and will follow up for routine testing of vitamin D, at least 2-3 times per year. She was informed of the risk of over-replacement of vitamin D and agrees to not increase her dose unless she discusses this with [name] first. We will check labs today. Laurian agrees to follow up with our clinic in 3 weeks.  Insulin Resistance [name] will continue to work on weight loss, exercise, and decreasing simple carbohydrates in her diet to help decrease the risk of diabetes. We dicussed metformin including benefits and risks. She was informed that eating too many simple carbohydrates or too many calories at one sitting increases the likelihood of GI side effects. [name] will continue with weight loss efforts and  taking metformin for now and prescription was not written today. We will check labs today. [name] agrees to follow up with our clinic in 3 weeks.  Cardiovascular risk counseling [name] was given extended (15 minutes) coronary artery disease prevention counseling today. She is 48 y.o. female and has risk factors for  heart disease including obesity. We discussed intensive lifestyle modifications today with an emphasis on specific weight loss instructions and strategies. Pt was also informed of the importance of increasing exercise and decreasing saturated fats to help prevent heart disease.  Obesity [name] is currently in the action stage of change. As such, her goal is to continue with weight loss efforts She has agreed to follow the Category 3 plan [name] has been instructed to work up to a goal of 150 minutes of combined cardio and strengthening exercise per week for weight loss and overall health benefits. We discussed the following Behavioral Modification Strategies today: work on meal planning and easy cooking plans and ways to avoid boredom eating  [name] has agreed to follow up with our clinic in 3 weeks. She was informed of the importance of frequent follow up visits to maximize her success with intensive lifestyle modifications for her multiple health conditions.  ALLERGIES: No Known Allergies  MEDICATIONS: Current Outpatient Medications on File Prior to Visit  Medication Sig Dispense Refill  . atorvastatin (LIPITOR) 40 MG tablet TAKE 1 TABLET BY MOUTH ONCE A DAY 30 tablet 3  . carvedilol (COREG) 25 MG tablet TAKE 1 TABLET BY MOUTH TWICE A DAY WITH A MEAL 60 tablet 6  . digoxin (LANOXIN) 0.125 MG tablet TAKE 1 TABLET BY MOUTH ONCE DAILY 30 tablet 3  . doxycycline (VIBRAMYCIN) 100 MG capsule Take 1 capsule (100 mg total) by mouth 2 (two) times daily for 7 days. 14 capsule 0  . ENTRESTO 24-26 MG TAKE 1 TABLET BY MOUTH TWICE (2) DAILY 60 tablet 5  . furosemide (LASIX) 40 MG tablet Take 1.5 tablets (60 mg total) by mouth 2 (two) times daily. 90 tablet 3  . ivabradine (CORLANOR) 5 MG TABS tablet Take 1 tablet (5 mg total) by mouth 2 (two) times daily with a meal. 60 tablet 6  . metFORMIN (GLUCOPHAGE) 500 MG tablet Take 1 tablet (500 mg total) by mouth daily with breakfast. 30 tablet 0  . potassium  chloride SA (K-DUR,KLOR-CON) 20 MEQ tablet Take 2 tablets (40 mEq total) by mouth daily. 60 tablet 6  . spironolactone (ALDACTONE) 25 MG tablet TAKE 1 TABLET BY MOUTH ONCE A DAY 30 tablet 6  . Vitamin D, Ergocalciferol, (DRISDOL) 1.25 MG (50000 UT) CAPS capsule Take 1 capsule (50,000 Units total) by mouth every 7 (seven) days. 4 capsule 0   No current facility-administered medications on file prior to visit.     PAST MEDICAL HISTORY: Past Medical History:  Diagnosis Date  . Automatic implantable cardioverter-defibrillator in situ   . Chronic CHF (congestive heart failure) (HCC)    a. EF 15-20% b. RHC (09/2013) RA 14, RV 57/22, PA 64/36 (48), PCWP 18, FIck CO/CI 3.7/1.6, PVR 8.1 WU, PA sat 47%   . History of stomach ulcers   . Hypertension   . Hypotension   . Hypothyroidism   . Morbid obesity ([place])   . Myocardial infarction ([place]) 08/2013  . Nocturnal dyspnea   . Nonischemic cardiomyopathy ([place])   . Sinus tachycardia   . Snoring-prob OSA 09/04/2011  . Sprint Fidelis ICD lead RECALL  [id number]     PAST SURGICAL HISTORY: Past  Surgical History:  Procedure Laterality Date  . BREATH TEK H PYLORI N/A 11/09/2014   Procedure: BREATH TEK H PYLORI;  Surgeon: Greer Pickerel, MD;  Location: [facility];  Service: General;  Laterality: N/A;  . CARDIAC CATHETERIZATION  ~ 2006; 09/2013  . CARDIAC CATHETERIZATION N/A 05/23/2016   Procedure: Right Heart Cath;  Surgeon: Larey Dresser, MD;  Location: [facility];  Service: Cardiovascular;  Laterality: N/A;  . CARDIAC DEFIBRILLATOR PLACEMENT  2006; 12/26/2013   Medtronic [id number]; 12-2013 ICD gen change and RV lead revision with new 8099 RV lead by Dr [name]  . CESAREAN SECTION  1999  . IMPLANTABLE CARDIOVERTER DEFIBRILLATOR GENERATOR CHANGE N/A 12/26/2013   Procedure: IMPLANTABLE CARDIOVERTER DEFIBRILLATOR GENERATOR CHANGE;  Surgeon: Deboraha Sprang, MD;  Location: [facility] CATH LAB;  Service: Cardiovascular;  Laterality: N/A;  . LEAD REVISION N/A 12/26/2013     Procedure: LEAD REVISION;  Surgeon: Deboraha Sprang, MD;  Location: [facility] CATH LAB;  Service: Cardiovascular;  Laterality: N/A;  . RIGHT HEART CATHETERIZATION N/A 02/15/2014   Procedure: RIGHT HEART CATH;  Surgeon: Larey Dresser, MD;  Location: [facility] C[facility] LAB;  Service: Cardiovascular;  Laterality: N/A;  . TUBAL LIGATION  1999    SOCIAL HISTORY: Social History   Tobacco Use  . Smoking status: Never Smoker  . Smokeless tobacco: Never Used  Substance Use Topics  . Alcohol use: No  . Drug use: No    FAMILY HISTORY: Family History  Problem Relation Age of Onset  . Heart disease Maternal Grandmother   . Heart disease Mother   . Obesity Mother   . High blood pressure Father   . Stroke Father     ROS: Review of Systems  Constitutional: Negative for weight loss.  Respiratory: Negative for shortness of breath.   Cardiovascular: Negative for chest pain.  Gastrointestinal: Negative for nausea and vomiting.  Genitourinary:       Negative for polyuria  Musculoskeletal:       Negative for muscle weakness  Endo/Heme/Allergies:       Negative for polyphagia    PHYSICAL EXAM: Blood pressure (!) 73/58, pulse 91, temperature 98.9 F (37.2 C), temperature source Oral, height 5\' 3"  (1.6 m), weight 249 lb (112.9 kg), SpO2 98 %. Body mass index is 44.11 kg/m. Physical Exam Vitals signs reviewed.  Constitutional:      Appearance: Normal appearance. She is obese.  Cardiovascular:     Rate and Rhythm: Normal rate.     Pulses: Normal pulses.  Pulmonary:     Effort: Pulmonary effort is normal.  Musculoskeletal: Normal range of motion.  Skin:    General: Skin is warm and dry.  Neurological:     Mental Status: She is alert and oriented to person, place, and time.  Psychiatric:        Mood and Affect: Mood normal.        Behavior: Behavior normal.     RECENT LABS AND TESTS: BMET    Component Value Date/Time   NA 138 08/10/2018 1012   NA 137 03/10/2018 1507   NA 138 09/24/2013  0536   K 3.0 (L) 08/10/2018 1012   K 3.7 09/24/2013 0536   CL 103 08/10/2018 1012   CL 107 09/24/2013 0536   CO2 24 08/10/2018 1012   CO2 25 09/24/2013 0536   GLUCOSE 95 08/10/2018 1012   GLUCOSE 82 09/24/2013 0536   BUN 8 08/10/2018 1012   BUN 10 03/10/2018 1507   BUN 11 09/24/2013  0536   CREATININE 0.98 08/10/2018 1012   CREATININE 1.08 09/24/2013 0536   CALCIUM 8.9 08/10/2018 1012   CALCIUM 8.6 09/24/2013 0536   GFRNONAA >60 08/10/2018 1012   GFRNONAA >60 09/24/2013 0536   GFRAA >60 08/10/2018 1012   GFRAA >60 09/24/2013 0536   Lab Results  Component Value Date   HGBA1C 5.4 03/10/2018   HGBA1C 5.6 10/05/2017   HGBA1C 5.9 09/19/2013   Lab Results  Component Value Date   INSULIN 7.9 03/10/2018   INSULIN 22.5 10/05/2017   CBC    Component Value Date/Time   WBC 8.9 10/05/2017 1218   WBC 10.6 (H) 05/20/2016 1515   RBC 4.84 10/05/2017 1218   RBC 4.64 05/20/2016 1515   HGB 13.6 10/05/2017 1218   HCT 41.7 10/05/2017 1218   PLT 269 05/20/2016 1515   PLT 195 09/23/2013 0443   MCV 86 10/05/2017 1218   MCV 85 09/23/2013 0443   MCH 28.1 10/05/2017 1218   MCH 28.2 05/20/2016 1515   MCHC 32.6 10/05/2017 1218   MCHC 32.8 05/20/2016 1515   RDW 14.5 10/05/2017 1218   RDW 15.4 (H) 09/23/2013 0443   LYMPHSABS 1.9 10/05/2017 1218   LYMPHSABS 2.0 09/23/2013 0443   MONOABS 0.5 12/19/2013 1158   MONOABS 0.4 09/23/2013 0443   EOSABS 0.2 10/05/2017 1218   EOSABS 0.1 09/23/2013 0443   BASOSABS 0.0 10/05/2017 1218   BASOSABS 0.1 09/23/2013 0443   Iron/TIBC/Ferritin/ %Sat No results found for: IRON, TIBC, FERRITIN, IRONPCTSAT Lipid Panel     Component Value Date/Time   CHOL 169 03/10/2018 1507   CHOL 183 09/20/2013 0424   TRIG 74 03/10/2018 1507   TRIG 105 09/20/2013 0424   HDL 55 03/10/2018 1507   HDL 40 09/20/2013 0424   CHOLHDL 3.1 03/10/2018 1507   CHOLHDL 3.3 07/21/2017 0854   VLDL 18 07/21/2017 0854   VLDL 21 09/20/2013 0424   LDLCALC 99 03/10/2018 1507    LDLCALC 122 (H) 09/20/2013 0424   Hepatic Function Panel     Component Value Date/Time   PROT 7.8 03/10/2018 1507   PROT 7.7 09/19/2013 0431   ALBUMIN 4.5 03/10/2018 1507   ALBUMIN 3.5 09/19/2013 0431   AST 17 03/10/2018 1507   AST 36 09/19/2013 0431   ALT 10 03/10/2018 1507   ALT 32 09/19/2013 0431   ALKPHOS 88 03/10/2018 1507   ALKPHOS 65 09/19/2013 0431   BILITOT 0.4 03/10/2018 1507   BILITOT 0.4 09/19/2013 0431   BILIDIR <0.1 (L) 07/21/2017 0854   IBILI NOT CALCULATED 07/21/2017 0854      Component Value Date/Time   TSH 3.680 10/05/2017 1218   TSH 5.995 (H) 11/14/2016 1354   TSH 7.068 (H) 11/10/2016 1040   TSH 2.90 02/29/2008 1132    Ref. Range 03/10/2018 15:07  Vitamin D, 25-Hydroxy Latest Ref Range: 30.0 - 100.0 ng/mL 36.0     OBESITY BEHAVIORAL INTERVENTION VISIT  Today's visit was # 17   Starting weight: 277 lbs Starting date: 10/05/2017 Today's weight :: 249 lbs Today's date: 08/17/2018 Total lbs lost to date: 28    08/17/2018  BP 73/58 (A)  Temp 98.9 F (37.2 C)  Pulse 91  SpO2 98 %  Weight 249 lb  Height 5\' 3"  (1.6 m)  BMI (Calculated) 44.12   ASK: We discussed the diagnosis of obesity with [name] today and [name] agreed to give [name] permission to discuss obesity behavioral modification therapy today.  ASSESS: [name] has the diagnosis of obesity and  her BMI today is 44.12 [name] is in the action stage of change   ADVISE: [name] was educated on the multiple health risks of obesity as well as the benefit of weight loss to improve her health. She was advised of the need for long term treatment and the importance of lifestyle modifications to improve her current health and to decrease her risk of future health problems.  AGREE: Multiple dietary modification options and treatment options were discussed and  [name] agreed to follow the recommendations documented in the above note.  ARRANGE: [name] was educated on the importance of frequent visits to  treat obesity as outlined per CMS and USPSTF guidelines and agreed to schedule her next follow up appointment today.  I, Tammy Wysor am acting as Location manager for [employer] I, Abby Potash, PA-C have reviewed above note and agree with its content

## 2018-08-18 LAB — LIPID PANEL WITH LDL/HDL RATIO
Cholesterol, Total: 171 mg/dL (ref 100–199)
HDL: 46 mg/dL (ref >39)
LDL Calculated: 103 mg/dL — ABNORMAL HIGH (ref 0–99)
LDl/HDL Ratio: 2.2 ratio (ref 0.0–3.2)
Triglycerides: 111 mg/dL (ref 0–149)
VLDL Cholesterol Cal: 22 mg/dL (ref 5–40)

## 2018-08-18 LAB — HEMOGLOBIN A1C
Est. average glucose Bld gHb Est-mCnc: 108 mg/dL
Hgb A1c MFr Bld: 5.4 % (ref 4.8–5.6)

## 2018-08-18 LAB — VITAMIN D 25 HYDROXY (VIT D DEFICIENCY, FRACTURES): Vit D, 25-Hydroxy: 29.2 ng/mL — ABNORMAL LOW (ref 30.0–100.0)

## 2018-08-18 LAB — INSULIN, RANDOM: INSULIN: 15.6 u[IU]/mL (ref 2.6–24.9)

## 2018-08-19 DIAGNOSIS — R002 Palpitations: Secondary | ICD-10-CM | POA: Diagnosis not present

## 2018-08-20 ENCOUNTER — Telehealth (HOSPITAL_COMMUNITY): Payer: Self-pay | Admitting: *Deleted

## 2018-08-20 DIAGNOSIS — I493 Ventricular premature depolarization: Secondary | ICD-10-CM

## 2018-08-20 MED ORDER — AMIODARONE HCL 200 MG PO TABS: ORAL_TABLET | ORAL | 3 refills | Status: DC

## 2018-08-20 NOTE — Telephone Encounter (Signed)
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  Has ICD.  May be playing a role in cardiomyopathy.  Would recommend starting amiodarone 200 mg bid x 10 days then 200 mg daily.  Will have her repeat Zio patch when amiodarone loaded.  Make sure she has followup with EP.

## 2018-08-20 NOTE — Telephone Encounter (Signed)
Notes recorded by Scarlette Calico, RN on 08/20/2018 at 4:47 PM EST Spoke w/pt, she is aware and agreeable, rx sent in, repeat Zio sch for 3/18, message sent to Dr Olin Pia office to get her an appt after repeat [facility]

## 2018-08-24 ENCOUNTER — Ambulatory Visit (HOSPITAL_COMMUNITY)
Admission: RE | Admit: 2018-08-24 | Discharge: 2018-08-24 | Disposition: A | Discharge status: Home / Self Care | Payer: BLUE CROSS/BLUE SHIELD | Source: Ambulatory Visit | Attending: Cardiology | Admitting: Cardiology

## 2018-08-24 DIAGNOSIS — I5022 Chronic systolic (congestive) heart failure: Secondary | ICD-10-CM | POA: Insufficient documentation

## 2018-08-24 LAB — BASIC METABOLIC PANEL
Anion gap: 9 (ref 5–15)
BUN: 13 mg/dL (ref 6–20)
CO2: 22 mmol/L (ref 22–32)
Calcium: 9.4 mg/dL (ref 8.9–10.3)
Chloride: 107 mmol/L (ref 98–111)
Creatinine, Ser: 1.05 mg/dL — ABNORMAL HIGH (ref 0.44–1.00)
GFR calc Af Amer: >60 mL/min (ref >60)
GFR calc non Af Amer: >60 mL/min (ref >60)
Glucose, Bld: 79 mg/dL (ref 70–99)
Potassium: 3.9 mmol/L (ref 3.5–5.1)
Sodium: 138 mmol/L (ref 135–145)

## 2018-09-07 ENCOUNTER — Telehealth (HOSPITAL_COMMUNITY): Payer: Self-pay

## 2018-09-07 ENCOUNTER — Ambulatory Visit (INDEPENDENT_AMBULATORY_CARE_PROVIDER_SITE_OTHER): Payer: BLUE CROSS/BLUE SHIELD | Admitting: Physician Assistant

## 2018-09-07 NOTE — Telephone Encounter (Signed)
Spoke with pt regarding cold/allergy symptoms, denied fever or any exposure. Agreed to come in for appt tomorrow

## 2018-09-08 ENCOUNTER — Other Ambulatory Visit: Payer: Self-pay

## 2018-09-08 ENCOUNTER — Ambulatory Visit (HOSPITAL_COMMUNITY)
Admission: RE | Admit: 2018-09-08 | Discharge: 2018-09-08 | Disposition: A | Discharge status: Home / Self Care | Payer: BLUE CROSS/BLUE SHIELD | Source: Ambulatory Visit | Attending: Cardiology | Admitting: Cardiology

## 2018-09-08 DIAGNOSIS — I493 Ventricular premature depolarization: Secondary | ICD-10-CM

## 2018-09-09 ENCOUNTER — Other Ambulatory Visit (HOSPITAL_COMMUNITY): Payer: Self-pay | Admitting: Cardiology

## 2018-09-14 ENCOUNTER — Encounter: Payer: Self-pay | Admitting: Internal Medicine

## 2018-09-15 ENCOUNTER — Encounter: Payer: BLUE CROSS/BLUE SHIELD | Admitting: Internal Medicine

## 2018-09-15 DIAGNOSIS — I493 Ventricular premature depolarization: Secondary | ICD-10-CM | POA: Diagnosis not present

## 2018-09-16 ENCOUNTER — Encounter (INDEPENDENT_AMBULATORY_CARE_PROVIDER_SITE_OTHER): Payer: Self-pay

## 2018-09-17 ENCOUNTER — Telehealth: Payer: Self-pay

## 2018-09-17 DIAGNOSIS — I5022 Chronic systolic (congestive) heart failure: Secondary | ICD-10-CM

## 2018-09-17 MED ORDER — FUROSEMIDE 40 MG PO TABS: 80.0000 mg | ORAL_TABLET | Freq: Two times a day (BID) | ORAL | 3 refills | Status: DC

## 2018-09-17 MED ORDER — POTASSIUM CHLORIDE CRYS ER 20 MEQ PO TBCR: EXTENDED_RELEASE_TABLET | ORAL | 3 refills | Status: DC

## 2018-09-17 NOTE — Telephone Encounter (Addendum)
Call to patient.  Advised Dr [name] has ordered to stay on Lasix 80 mg twice a day and Potassium 40 mEq every AM and 20 mEq every PM for now.  Lab work is needed next week and she agreed to have it drawn it is scheduled on 09/22/2018 at 9:45 AM at HF clinic.  Explained HF clinic will call to set up a telehealth visit with Dr [name] next week.  Advised if symptoms worsen or don't improve to call back.  ICM direct number provided.  Patient has enough supply on hand for potassium and Lasix.  Advised to call pharmacy when refill is needed.

## 2018-09-17 NOTE — Telephone Encounter (Signed)
Keep the Lasix at 80 mg bid and KCl at the higher dose for now as well.  She will need a BMET in a week and needs to schedule telehealth visit with me.  Optivol is up a lot.

## 2018-09-17 NOTE — Telephone Encounter (Signed)
REFERRAL: Patient referred to ICM by Dr Olin Pia nurse, Dollene Primrose due to patient reported today by phone that she feels fluid overloaded.  Lorren advised her to send remote transmission so fluid levels can be reviewed.  PATIENT CALL:  ICM call to patient today.  She has an phone visit with Dr [name] next week 09/23/2018.  Provided ICM intro and patient agreed to monthly ICM calls.  She is also a patient of Dr Oleh Genin, [facility].    SYMPTOMS: Stomach swelling, decreased appetite and 4.7 lb weight gain overnight.    She increased Furosemide 40 mg to 2 tablets twice a day yesterday along with 1 extra Potassium on 09/16/2018.  Patient reports Dr [name] has previously given her these instructions at HF clinic appointments in the past.   She reports she has been eating healthier and surprised she is having fluid symptoms.  Discussed reviewing food labels to look for hidden salt in there foods and limit to 2000 mg daily and fluid to 64 oz daily.   RECOMMENDATIONS: Advised her to follow prior instructions given to her by Dr [name] at past appointments.   PLAN: She will take Furosemide 40 mg 2 tablets (80 mg total) to twice a day x 2 more days and then return to 60 mg twice a day.   She will increase Potassium 20 mEq to 2 tablets in AM and 1 tablet in PM x 2 days and then return to 2 tablets every morning.    Advised to call back if weight does not decrease.  Scheduled a remote transmission for 09/23/2018 so that Dr [name] can review for her phone visit if needed.   Advised will send copy of phone note to Dr [name] for review as well.    Next ICM Remote Transmission scheduled for 09/29/2018 to recheck fluid levels.    09/17/2018 Reviewed Optivol thoracic impedance which suggested fluid accumulation since 08/27/2018.

## 2018-09-17 NOTE — Addendum Note (Signed)
Addended by: [name] on: 09/17/2018 05:41 PM   Modules accepted: Orders

## 2018-09-20 ENCOUNTER — Telehealth (HOSPITAL_COMMUNITY): Payer: Self-pay

## 2018-09-20 NOTE — Telephone Encounter (Signed)
Pt reports she is already on Amiodarone 200mg  daily (confirmed by chart). She was ordered 200mg  BID x2 weeks THEN 200mg  daily on 2/28. Pt reports she was on monitor while taking Amiodarone. She is due to see Dr. [name] this week. She also has a lab appt this week in our office.  Per Dr. [name], he will see her on Wednesday when she comes in for her lab visit to discuss plan of care.

## 2018-09-20 NOTE — Telephone Encounter (Signed)
-----   Message from Larey Dresser, MD sent at 09/19/2018 10:17 AM EDT ----- PVC percentage up to 13.3% with NSVT runs.  Would start on amiodarone 200 mg bid x 14 days then 200 mg daily.  Will repeat Zio patch in future on amiodarone to make sure suppressed.  I am concerned that PVCs could be contributing to her cardiomyopathy.  Given young age, would prefer not to have her on amiodarone long-term.  Please refer back over to EP for evaluation of NSVT/PVCs.  She has an ICD.

## 2018-09-22 ENCOUNTER — Ambulatory Visit (HOSPITAL_COMMUNITY)
Admission: RE | Admit: 2018-09-22 | Discharge: 2018-09-22 | Disposition: A | Discharge status: Home / Self Care | Payer: BLUE CROSS/BLUE SHIELD | Source: Ambulatory Visit | Attending: Cardiology | Admitting: Cardiology

## 2018-09-22 ENCOUNTER — Encounter (HOSPITAL_COMMUNITY): Payer: Self-pay

## 2018-09-22 ENCOUNTER — Other Ambulatory Visit: Payer: Self-pay

## 2018-09-22 ENCOUNTER — Encounter (HOSPITAL_COMMUNITY): Payer: Self-pay | Admitting: Cardiology

## 2018-09-22 ENCOUNTER — Other Ambulatory Visit (HOSPITAL_COMMUNITY): Payer: BLUE CROSS/BLUE SHIELD

## 2018-09-22 VITALS — BP 104/86 | HR 108 | Temp 97.5°F | Wt 248.4 lb

## 2018-09-22 DIAGNOSIS — Z79899 Other long term (current) drug therapy: Secondary | ICD-10-CM | POA: Insufficient documentation

## 2018-09-22 DIAGNOSIS — R0683 Snoring: Secondary | ICD-10-CM | POA: Insufficient documentation

## 2018-09-22 DIAGNOSIS — I272 Pulmonary hypertension, unspecified: Secondary | ICD-10-CM | POA: Diagnosis not present

## 2018-09-22 DIAGNOSIS — I110 Hypertensive heart disease with heart failure: Secondary | ICD-10-CM | POA: Insufficient documentation

## 2018-09-22 DIAGNOSIS — R05 Cough: Secondary | ICD-10-CM | POA: Insufficient documentation

## 2018-09-22 DIAGNOSIS — Z7984 Long term (current) use of oral hypoglycemic drugs: Secondary | ICD-10-CM | POA: Insufficient documentation

## 2018-09-22 DIAGNOSIS — Z6841 Body Mass Index (BMI) 40.0 and over, adult: Secondary | ICD-10-CM | POA: Diagnosis not present

## 2018-09-22 DIAGNOSIS — I428 Other cardiomyopathies: Secondary | ICD-10-CM | POA: Diagnosis not present

## 2018-09-22 DIAGNOSIS — Z95810 Presence of automatic (implantable) cardiac defibrillator: Secondary | ICD-10-CM | POA: Diagnosis not present

## 2018-09-22 DIAGNOSIS — I493 Ventricular premature depolarization: Secondary | ICD-10-CM | POA: Insufficient documentation

## 2018-09-22 DIAGNOSIS — E039 Hypothyroidism, unspecified: Secondary | ICD-10-CM | POA: Insufficient documentation

## 2018-09-22 DIAGNOSIS — I5022 Chronic systolic (congestive) heart failure: Secondary | ICD-10-CM

## 2018-09-22 DIAGNOSIS — R0602 Shortness of breath: Secondary | ICD-10-CM | POA: Insufficient documentation

## 2018-09-22 DIAGNOSIS — I252 Old myocardial infarction: Secondary | ICD-10-CM | POA: Diagnosis not present

## 2018-09-22 DIAGNOSIS — E785 Hyperlipidemia, unspecified: Secondary | ICD-10-CM | POA: Insufficient documentation

## 2018-09-22 DIAGNOSIS — I472 Ventricular tachycardia: Secondary | ICD-10-CM | POA: Insufficient documentation

## 2018-09-22 LAB — DIGOXIN LEVEL: Digoxin Level: 0.4 ng/mL — ABNORMAL LOW (ref 0.8–2.0)

## 2018-09-22 LAB — COMPREHENSIVE METABOLIC PANEL
ALT: 26 U/L (ref 0–44)
AST: 19 U/L (ref 15–41)
Albumin: 3.6 g/dL (ref 3.5–5.0)
Alkaline Phosphatase: 66 U/L (ref 38–126)
Anion gap: 11 (ref 5–15)
BUN: 17 mg/dL (ref 6–20)
CO2: 22 mmol/L (ref 22–32)
Calcium: 9.5 mg/dL (ref 8.9–10.3)
Chloride: 106 mmol/L (ref 98–111)
Creatinine, Ser: 1.37 mg/dL — ABNORMAL HIGH (ref 0.44–1.00)
GFR calc Af Amer: 53 mL/min — ABNORMAL LOW (ref >60)
GFR calc non Af Amer: 46 mL/min — ABNORMAL LOW (ref >60)
Glucose, Bld: 96 mg/dL (ref 70–99)
Potassium: 4 mmol/L (ref 3.5–5.1)
Sodium: 139 mmol/L (ref 135–145)
Total Bilirubin: 1.3 mg/dL — ABNORMAL HIGH (ref 0.3–1.2)
Total Protein: 7 g/dL (ref 6.5–8.1)

## 2018-09-22 LAB — TSH: TSH: 12.027 u[IU]/mL — ABNORMAL HIGH (ref 0.350–4.500)

## 2018-09-22 MED ORDER — METOLAZONE 2.5 MG PO TABS: 2.5000 mg | ORAL_TABLET | Freq: Every day | ORAL | 0 refills | Status: DC

## 2018-09-22 MED ORDER — TORSEMIDE 20 MG PO TABS: 80.0000 mg | ORAL_TABLET | Freq: Two times a day (BID) | ORAL | 3 refills | Status: DC

## 2018-09-22 NOTE — Patient Instructions (Addendum)
Labs were done today. We will call you with any ABNORMAL results. No news is good news!  Please follow up with lab work in one week 09/29/2018 @ 945am  DISCONTINUE Lasix.  BEGIN Torsemide 80 mg (4 tabs) in the am and 40 mg (2 tabs) in the pm for FIVE DAYS ONLY. Then take 80 mg daily as your regular dose.  Take ONE Metolazone 2.5 mg (1 tab) today only with first Torsemide dose. Take extra 20 mEq of potassium with these medications. SAVE other 4 tabs of Metolazone and only take as instructed.   Your physician recommends that you schedule a follow-up appointment in: 10 days with a [facility] visit, this has been scheduled for 10/05/2018 @ 11am

## 2018-09-22 NOTE — Progress Notes (Signed)
Patient ID: [name], female   DOB: 1971/01/09, 48 y.o.   MRN: [id number]  PCP: [facility] in [place] Cardiology: Dr. [name]  HPI: [name] is a 48 y.o. 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  She was followed for systolic CHF initially in [place]. Apparently her EF recovered to 35-40%. However, she was admitted to [facility] 09/19/13 for worsening dyspnea and CP. Coronaries were reportedly OK on LHC at that time at [facility]. Echo showed EF of 15-20% with four-chamber enlargement with moderate-severe TR/MR. RHC as below. Rheumatological w/u negative.  CPX in 9/16 showed near-normal functional capacity.  Repeat echo in 6/17 shows that EF remains 20%. Repeat CPX in 1/18 was submaximal but probably only mild circulatory limitation.  Echo 8/18 showed EF 25-30%, severe LV dilation.  CPX in 6/19 showed low normal functional capacity.  Echo in 10/19 showed EF 20-25%, moderate LV dilation.   Zio patch in 2/20 with 11% PVCs and NSVT, amiodarone started. Zio patch in 3/20 with 13.3% PVCs and NSVT.   She returns today for followup of heart failure.  Despite increasing Lasix, her weight is up 3 lbs.  She is more short of breath, gets dyspneic 1/2 way up stairs in her house.  Dyspnea walking across her house.  No appetite/early satiety.  She has worsening orthopnea, has slept in recliner last several days.  She has a cough now.     Medtronic device interrogation: Fluid index > threshold with decreased thoracic impedance. No VT.    ECG (personally reviewed): NSR, PVCs, LVH with QRS widening (132 msec)  RHC Procedural Findings: RA mean 5 RV 55/8 PA 56/31, mean 40 PCWP mean 24 Oxygen saturations: PA 61% AO 99% Cardiac Output (Fick) 4.3  Cardiac Index (Fick) 2.0 PVR 3.7 WU  RHC 09/24/13  RA 14  RV 57/22  PA 64/36 (48)  PCWP 18  Fick CO/CI: 3.7/1.6  PVR 8.1 WU  Ao sat 95%  PA sat  47%   CPX 06/27/2016  Peak VO2: 13.5 (56.2% predicted peak VO2) VE/VCO2 slope: 33 OUES: 2.16 Peak RER: 0.98  CPX  1/18 Peak VO2 13.5 VE/VCO2 slope 33 RER 0.98 Submaximal study, mild circulatory limitation, suspect more limited by body habitus.   Echo (8/15) with EF 20%, moderate LV dilation, diffuse hypokinesis, normal RV size with mildly decreased systolic function, mild MR.  Echo (5/16) with EF 20%, spherical LV with diffuse hypokinesis, mild MR.  Echo (6/17) with EF 20%, diffuse hypokinesis, moderate LV dilation, normal RV, mild to moderate MR.  Echo (8/18) with EF 25-30%, severe LV dilation, severe LAE.  Echo (2/20) with EF 20-25%, moderate LV dilation, moderate MR.   Sleep study did not show significant OSA.   RHC (8/15): Mean RA 3 PA 33/11 mean 19 Mean PCWP 8 CI 2.35  Holter (8/15) with < 1% PVCs  CPX 9/16 Peak VO2 16.2 VE/VCO2 slope 23.6 RER 1.25 Normal functional capacity  CPX (6/19): peak VO2 14.3 (83% predicted), VE/VCO2 slope 30 => low normal functional capacity.   Labs: (11/09/13): Dig level 0.5, K 3.6, Creatinine 0.83, pro-BNP 622 (12/19/13): K 3.6, creatinine 0.8 (7/15): K 4.1, creatinine 0.91 (8/15): K 3.9, creatinine 0.91 (10/15): K 4, creatinine 0.93, proBNP 129 (12/15): K 4, creatinine 0.9, digoxin 0.4 (5/16): K 4, creatinine 0.86 (9/16): K 3.6, creatinine 0.86, digoxin < 0.2 (5/17): K 4.1, creatinine 0.95, digoxin 0.3 (5/18): K 3.4, creatinine  0.95, TSH elevated but free T3 and free T4 normal.  (8/18): K 3.9, creatinine 0.87, BNP 108  (11/18): K 3.5, creatinine 1.04, digoxin 0.4 (1/19): LDL 91, HDL 47 (3/19): digoxin 0.7, K 4, creatinine 0.99 (4/19): TSH normal, LDL 95 (9/19): LDL 99, HDL 45, K 4, creatinine 0.96 (10/19): K 3.6, creatinine 0.85, digoxin 0.5 (2/20): LDL 103 (3/20): K 3.9, creatinine 1.05  SH: Disabled since 2014. Lives her husband and 2 children. Does not drink alcohol or smoke   FH: Grandmother had heart failure   ROS:  All systems negative except as listed in HPI, PMH and Problem List.  Past Medical History:  Diagnosis Date  . Automatic implantable cardioverter-defibrillator in situ   . Chronic CHF (congestive heart failure) (HCC)    a. EF 15-20% b. RHC (09/2013) RA 14, RV 57/22, PA 64/36 (48), PCWP 18, FIck CO/CI 3.7/1.6, PVR 8.1 WU, PA sat 47%   . History of stomach ulcers   . Hypertension   . Hypotension   . Hypothyroidism   . Morbid obesity ([place])   . Myocardial infarction ([place]) 08/2013  . Nocturnal dyspnea   . Nonischemic cardiomyopathy ([place])   . Sinus tachycardia   . Snoring-prob OSA 09/04/2011  . Sprint Fidelis ICD lead RECALL  [phone]     Current Outpatient Medications  Medication Sig Dispense Refill  . amiodarone (PACERONE) 200 MG tablet Take 1 tablet (200 mg total) by mouth 2 (two) times daily for 10 days, THEN 1 tablet (200 mg total) daily. 40 tablet 3  . atorvastatin (LIPITOR) 40 MG tablet TAKE 1 TABLET BY MOUTH ONCE DAILY 30 tablet 3  . carvedilol (COREG) 25 MG tablet TAKE 1 TABLET BY MOUTH TWICE A DAY WITH A MEAL 60 tablet 6  . digoxin (LANOXIN) 0.125 MG tablet TAKE 1 TABLET BY MOUTH ONCE DAILY 30 tablet 3  . ENTRESTO 24-26 MG TAKE 1 TABLET BY MOUTH TWICE A DAY 60 tablet 5  . ivabradine (CORLANOR) 5 MG TABS tablet Take 1 tablet (5 mg total) by mouth 2 (two) times daily with a meal. 60 tablet 6  . metFORMIN (GLUCOPHAGE) 500 MG tablet Take 1 tablet (500 mg total) by mouth daily with breakfast. 30 tablet 0  . potassium chloride SA (K-DUR,KLOR-CON) 20 MEQ tablet Take 2 tablets (40 mEq total) every morning and 1 tablet (20 mEq total) every evening. 270 tablet 3  . spironolactone (ALDACTONE) 25 MG tablet TAKE 1 TABLET BY MOUTH ONCE A DAY 30 tablet 6  . Vitamin D, Ergocalciferol, (DRISDOL) 1.25 MG (50000 UT) CAPS capsule Take 1 capsule (50,000 Units total) by mouth every 7 (seven) days. 4 capsule 0  . metolazone (ZAROXOLYN) 2.5 MG tablet Take 1 tablet (2.5 mg total) by mouth daily. 5 tablet 0  .  torsemide (DEMADEX) 20 MG tablet Take 4 tablets (80 mg total) by mouth 2 (two) times daily. 360 tablet 3   No current facility-administered medications for this encounter.    Vitals:   09/22/18 0959  BP: 104/86  Pulse: (!) 108  Temp: (!) 97.5 F (36.4 C)  TempSrc: Temporal  SpO2: 98%  Weight: 112.7 kg (248 lb 6.4 oz)    Wt Readings from Last 3 Encounters:  09/22/18 112.7 kg (248 lb 6.4 oz)  08/17/18 112.9 kg (249 lb)  08/10/18 111.3 kg (245 lb 6.4 oz)     PHYSICAL EXAM: General: NAD Neck: JVP 10-12 cm with HJR, no thyromegaly or thyroid nodule.  Lungs: Clear to auscultation bilaterally with normal respiratory  effort. CV: Lateral PMI.  Heart regular S1/S2, +S3, no murmur.  No peripheral edema.  No carotid bruit.  Normal pedal pulses.  Abdomen: Soft, nontender, no hepatosplenomegaly, no distention.  Skin: Intact without lesions or rashes.  Neurologic: Alert and oriented x 3.  Psych: Normal affect. Extremities: No clubbing or cyanosis.  HEENT: Normal.   ASSESSMENT & PLAN: 1. Chronic systolic HF: Nonischemic cardiomyopathy.  She has a Medtronic ICD.  Cause for cardiomyopathy is uncertain: initially noted peri-partum (after son's birth), so cannot rule out peri-partum CMP.  On and off, she has had frequent PVCs.   RHC in 8/15 showed normal filling pressures and preserved cardiac output.  She does not qualify for CRT with narrow QRS.  CPX 6/19 with low normal functional capacity.  Last echo in 10/19 showed EF 20-25% with moderate LV dilation and moderate MR. Symptomatically worse recently, NYHA class III.  She is volume overloaded by exam and Optivol. - Stop Lasix, start torsemide 80 qam/40 qpm x 4 days then torsemide 80 daily after that.  She will take one dose of metolazone 2.5 with torsemide this morning, no regular metolazone after that.  She will take an extra KCl 20 with the metolazone. BMET today and again in 1 week.  - Continue Entresto 24/26 mg BID, no BP room to increase.    -  Continue Coreg 25 mg bid and ivabradine 5 mg bid.   - Continue current spironolactone and digoxin, check digoxin level.  2. Pulmonary HTN: Mixed picture with PVR 8.1 on RHC at [facility].  However, repeat study in 8/15 here showed no significant pulmonary hypertension.  3. Morbid obesity: Body mass index is 44 kg/m.   4. Hyperlipidemia: Good lipids in 9/19.   5. PVCs: Worsened with 11% PVCs + NSVT on 2/20 Zio patch.  Amiodarone started, but PVCs up to 13.3% with NSVT on 3/20 Zio patch.  The increased PVCs despite amiodarone use may be triggered by CHF exacerbation/volume overload.  - Continue amiodarone, will check LFTs and TSH today.  She will need a regular eye exam with amiodarone use.  - She has an appointment with Dr. [name].   Followup 10 days via telehealth.  [name] 09/22/2018

## 2018-09-23 ENCOUNTER — Telehealth (HOSPITAL_COMMUNITY): Payer: Self-pay

## 2018-09-23 ENCOUNTER — Telehealth (INDEPENDENT_AMBULATORY_CARE_PROVIDER_SITE_OTHER): Payer: BLUE CROSS/BLUE SHIELD | Admitting: Internal Medicine

## 2018-09-23 VITALS — BP 104/86 | HR 108 | Ht 63.0 in | Wt 236.8 lb

## 2018-09-23 DIAGNOSIS — I493 Ventricular premature depolarization: Secondary | ICD-10-CM | POA: Diagnosis not present

## 2018-09-23 DIAGNOSIS — I5022 Chronic systolic (congestive) heart failure: Secondary | ICD-10-CM

## 2018-09-23 DIAGNOSIS — I428 Other cardiomyopathies: Secondary | ICD-10-CM

## 2018-09-23 MED ORDER — LEVOTHYROXINE SODIUM 25 MCG PO TABS: 25.0000 ug | ORAL_TABLET | Freq: Every day | ORAL | 3 refills | Status: DC

## 2018-09-23 NOTE — Telephone Encounter (Signed)
-----   Message from Larey Dresser, MD sent at 09/23/2018 11:33 AM EDT ----- Labs ok except elevated TSH.  She has a history of hypothyroidism listed in chart but not on meds.  Please call and ask if she is taking medications for hypothyroidism. Forward TSH to PCP also.

## 2018-09-23 NOTE — Telephone Encounter (Signed)
Pt made aware of lab results. Pt reports she is on Metformin for hypothyroidism by providers at [place]. Results forwarded to Abby Potash PA [facility] for review. Pt verbalized understand of same.  Pt aware to call PA if she does not get a call in coming days about her labs.

## 2018-09-23 NOTE — Progress Notes (Signed)
Electrophysiology TeleHealth Note   Due to national recommendations of social distancing due to COVID 19, an audio/video telehealth visit is felt to be most appropriate for this patient at this time.  See MyChart message from today for the patient's consent to telehealth for [facility]   Date:  09/23/2018   ID:  [name], DOB 07-16-1970, MRN [id number]  Location: patient's home  Provider location: [address], [place]  Evaluation Performed: Initial Evaluation  PCP:  [name], MD  Cardiologist:  DM   Electrophysiologist:  sk   Chief Complaint:  PVC and heart falure   History of Present Illness:    [name] is a 48 y.o. female who presents via audio/video conferencing for a telehealth visit today for  Consultation for complex ventricular ectopy   Last seen 2016 with previously implanted ICD for primary prevention in [facility]   Previous ICD implanted  With generator replacement 2015 and replacing 6949       DATE TEST EF   8/15 Echo   20 %   10/19 Echo  20-25% MR mod   Date PVCs    /2016 1%   2/20 11%--VTNS Monomorphic pop  VTNS -MM and PM   3/20 13.3% Di morphic population 7.& 4 % Total   Date Cr K TSH LFTs Hgb  4/20 1.37 4.0 12.027 26             The patient denies chest pain; some shortness of breath  nocturnal dyspnea  Orthopnea and peripheral edema   There have been no palpitations , lightheadedness  or syncope .    Changed yday furosemide>>tosemide with significant overnight diuresis    No nausea on amio  TSH was normal    The patient denies symptoms of fevers, chills, cough, or new SOB worrisome for COVID 19.   Past Medical History:  Diagnosis Date  . Automatic implantable cardioverter-defibrillator in situ   . Chronic CHF (congestive heart failure) (HCC)    a. EF 15-20% b. RHC (09/2013) RA 14, RV 57/22, PA 64/36 (48), PCWP 18, FIck CO/CI 3.7/1.6, PVR 8.1 WU, PA sat 47%   . History of stomach ulcers   . Hypertension    . Hypotension   . Hypothyroidism   . Morbid obesity ([place])   . Myocardial infarction ([place]) 08/2013  . Nocturnal dyspnea   . Nonischemic cardiomyopathy ([place])   . Sinus tachycardia   . Snoring-prob OSA 09/04/2011  . Sprint Fidelis ICD lead RECALL  [id number]     Past Surgical History:  Procedure Laterality Date  . BREATH TEK H PYLORI N/A 11/09/2014   Procedure: BREATH TEK H PYLORI;  Surgeon: Greer Pickerel, MD;  Location: [facility];  Service: General;  Laterality: N/A;  . CARDIAC CATHETERIZATION  ~ 2006; 09/2013  . CARDIAC CATHETERIZATION N/A 05/23/2016   Procedure: Right Heart Cath;  Surgeon: Larey Dresser, MD;  Location: [facility] CV [facility];  Service: Cardiovascular;  Laterality: N/A;  . CARDIAC DEFIBRILLATOR PLACEMENT  2006; 12/26/2013   Medtronic [id number]; 12-2013 ICD gen change and RV lead revision with new 1829 RV lead by Dr [name]  . CESAREAN SECTION  1999  . IMPLANTABLE CARDIOVERTER DEFIBRILLATOR GENERATOR CHANGE N/A 12/26/2013   Procedure: IMPLANTABLE CARDIOVERTER DEFIBRILLATOR GENERATOR CHANGE;  Surgeon: Deboraha Sprang, MD;  Location: [facility] CATH LAB;  Service: Cardiovascular;  Laterality: N/A;  . LEAD REVISION N/A 12/26/2013   Procedure: LEAD REVISION;  Surgeon: Deboraha Sprang, MD;  Location: [facility] CATH LAB;  Service: Cardiovascular;  Laterality: N/A;  . RIGHT HEART CATHETERIZATION N/A 02/15/2014   Procedure: RIGHT HEART CATH;  Surgeon: Larey Dresser, MD;  Location: [facility] CATH LAB;  Service: Cardiovascular;  Laterality: N/A;  . TUBAL LIGATION  1999    Current Outpatient Medications  Medication Sig Dispense Refill  . amiodarone (PACERONE) 200 MG tablet Take 1 tablet (200 mg total) by mouth 2 (two) times daily for 10 days, THEN 1 tablet (200 mg total) daily. 40 tablet 3  . atorvastatin (LIPITOR) 40 MG tablet TAKE 1 TABLET BY MOUTH ONCE DAILY 30 tablet 3  . carvedilol (COREG) 25 MG tablet TAKE 1 TABLET BY MOUTH TWICE A DAY WITH A MEAL 60 tablet 6  . digoxin (LANOXIN) 0.125 MG tablet TAKE 1  TABLET BY MOUTH ONCE DAILY 30 tablet 3  . ENTRESTO 24-26 MG TAKE 1 TABLET BY MOUTH TWICE A DAY 60 tablet 5  . ivabradine (CORLANOR) 5 MG TABS tablet Take 1 tablet (5 mg total) by mouth 2 (two) times daily with a meal. 60 tablet 6  . metFORMIN (GLUCOPHAGE) 500 MG tablet Take 1 tablet (500 mg total) by mouth daily with breakfast. 30 tablet 0  . metolazone (ZAROXOLYN) 2.5 MG tablet Take 1 tablet (2.5 mg total) by mouth daily. 5 tablet 0  . potassium chloride SA (K-DUR,KLOR-CON) 20 MEQ tablet Take 2 tablets (40 mEq total) every morning and 1 tablet (20 mEq total) every evening. 270 tablet 3  . spironolactone (ALDACTONE) 25 MG tablet TAKE 1 TABLET BY MOUTH ONCE A DAY 30 tablet 6  . torsemide (DEMADEX) 20 MG tablet Take 4 tablets (80 mg total) by mouth 2 (two) times daily. 360 tablet 3  . Vitamin D, Ergocalciferol, (DRISDOL) 1.25 MG (50000 UT) CAPS capsule Take 1 capsule (50,000 Units total) by mouth every 7 (seven) days. 4 capsule 0   No current facility-administered medications for this visit.     Allergies:   Patient has no known allergies.   Social History:  The patient  reports that she has never smoked. She has never used smokeless tobacco. She reports that she does not drink alcohol or use drugs.   Family History:  The patient's   family history includes Heart disease in her maternal grandmother and mother; High blood pressure in her father; Obesity in her mother; Stroke in her father.   ROS:  Please see the history of present illness.   All other systems are personally reviewed and negative.    Exam:    Vital Signs:  BP 104/86   Pulse (!) 108   Ht 5\' 3"  (1.6 m)   Wt 236 lb 12.8 oz (107.4 kg)   BMI 41.95 kg/m     Well appearing, alert and conversant, regular work of breathing,  good skin color Eyes- anicteric, neuro- grossly intact, skin- no apparent rash or lesions or cyanosis, mouth- oral mucosa is pink   Labs/Other Tests and Data Reviewed:    Recent Labs: 10/05/2017:  Hemoglobin 13.6 09/22/2018: ALT 26; BUN 17; Creatinine, Ser 1.37; Potassium 4.0; Sodium 139; TSH 12.027   Wt Readings from Last 3 Encounters:  09/22/18 236 lb 12.8 oz (107.4 kg)  09/22/18 248 lb 6.4 oz (112.7 kg)  08/17/18 249 lb (112.9 kg)     Other studies personally reviewed: Additional studies/ records that were reviewed today include:notes and labs Review of the above records today demonstrates: *As above  Prior radiographs:     Last device remote is  reviewed from [facility] PDF dated 1/20* which reveals normal device function, VT NS clustered in Clark   ASSESSMENT & PLAN:    Nonischemic cardiomyopathy  Systolic heart failure-chronic/acute    Implantable defibrillator-Medtronic    6949-lead replaced   PVCs / VT NS  MR/TR  Stable PVC burden but much less noxious appearing than a month ago  Interestingly the population morphology has become dimorphic over the last month at the same time there is less ominous ( ie PM) VT- nonsustained  Reviewing ECG back to 2016 the average burden of ectopy seems about stable, so wonder if the worsening VT might be electromechanical as opposed to causative  REviewed by phone with DR DM and so would continue low dose amio  Aggressive diuresis      COVID 19 screen The patient denies symptoms of COVID 19 at this time.  The importance of social distancing was discussed today.  Follow-up:  47m Next remote: *5/20  Current medicines are reviewed at length with the patient today.   The patient does not have concerns regarding her medicines.  The following changes were made today:  Begin her on synthroid 25 mcg/day  Labs/ tests ordered today include: none No orders of the defined types were placed in this encounter.   Future tests ( post COVID )  Per CHF clinic  Patient Risk:  after full review of this patients clinical status, I feel that they are at moderate risk at this time.  Today, I have spent 26* minutes with the patient  with telehealth technology discussing As above .    Signed, Virl Axe, MD  09/23/2018 2:31 PM     [place] [address] [place] [address] [phone] (office) [phone] (fax)

## 2018-09-27 ENCOUNTER — Inpatient Hospital Stay (HOSPITAL_COMMUNITY)
Admission: EM | Admit: 2018-09-27 | Discharge: 2018-09-28 | DRG: 640 | Disposition: A | Discharge status: Home / Self Care | Payer: BLUE CROSS/BLUE SHIELD | Attending: Internal Medicine | Admitting: Internal Medicine

## 2018-09-27 ENCOUNTER — Other Ambulatory Visit: Payer: Self-pay

## 2018-09-27 ENCOUNTER — Emergency Department (HOSPITAL_COMMUNITY): Payer: BLUE CROSS/BLUE SHIELD

## 2018-09-27 ENCOUNTER — Encounter (HOSPITAL_COMMUNITY): Payer: Self-pay | Admitting: *Deleted

## 2018-09-27 DIAGNOSIS — Z8249 Family history of ischemic heart disease and other diseases of the circulatory system: Secondary | ICD-10-CM

## 2018-09-27 DIAGNOSIS — E876 Hypokalemia: Principal | ICD-10-CM | POA: Diagnosis present

## 2018-09-27 DIAGNOSIS — Z8349 Family history of other endocrine, nutritional and metabolic diseases: Secondary | ICD-10-CM | POA: Diagnosis not present

## 2018-09-27 DIAGNOSIS — Z823 Family history of stroke: Secondary | ICD-10-CM

## 2018-09-27 DIAGNOSIS — E785 Hyperlipidemia, unspecified: Secondary | ICD-10-CM | POA: Diagnosis not present

## 2018-09-27 DIAGNOSIS — R079 Chest pain, unspecified: Secondary | ICD-10-CM | POA: Diagnosis not present

## 2018-09-27 DIAGNOSIS — Z79899 Other long term (current) drug therapy: Secondary | ICD-10-CM

## 2018-09-27 DIAGNOSIS — I472 Ventricular tachycardia: Secondary | ICD-10-CM

## 2018-09-27 DIAGNOSIS — N179 Acute kidney failure, unspecified: Secondary | ICD-10-CM | POA: Diagnosis present

## 2018-09-27 DIAGNOSIS — R0789 Other chest pain: Secondary | ICD-10-CM | POA: Diagnosis not present

## 2018-09-27 DIAGNOSIS — Z95810 Presence of automatic (implantable) cardiac defibrillator: Secondary | ICD-10-CM

## 2018-09-27 DIAGNOSIS — T82198A Other mechanical complication of other cardiac electronic device, initial encounter: Secondary | ICD-10-CM | POA: Diagnosis not present

## 2018-09-27 DIAGNOSIS — I4901 Ventricular fibrillation: Secondary | ICD-10-CM | POA: Diagnosis present

## 2018-09-27 DIAGNOSIS — I428 Other cardiomyopathies: Secondary | ICD-10-CM | POA: Diagnosis present

## 2018-09-27 DIAGNOSIS — Z79890 Hormone replacement therapy (postmenopausal): Secondary | ICD-10-CM | POA: Diagnosis not present

## 2018-09-27 DIAGNOSIS — I252 Old myocardial infarction: Secondary | ICD-10-CM | POA: Diagnosis not present

## 2018-09-27 DIAGNOSIS — I272 Pulmonary hypertension, unspecified: Secondary | ICD-10-CM | POA: Diagnosis present

## 2018-09-27 DIAGNOSIS — I5023 Acute on chronic systolic (congestive) heart failure: Secondary | ICD-10-CM | POA: Diagnosis present

## 2018-09-27 DIAGNOSIS — Z7984 Long term (current) use of oral hypoglycemic drugs: Secondary | ICD-10-CM

## 2018-09-27 DIAGNOSIS — Z6841 Body Mass Index (BMI) 40.0 and over, adult: Secondary | ICD-10-CM | POA: Diagnosis not present

## 2018-09-27 DIAGNOSIS — I110 Hypertensive heart disease with heart failure: Secondary | ICD-10-CM | POA: Diagnosis not present

## 2018-09-27 DIAGNOSIS — I5022 Chronic systolic (congestive) heart failure: Secondary | ICD-10-CM

## 2018-09-27 DIAGNOSIS — Z4502 Encounter for adjustment and management of automatic implantable cardiac defibrillator: Secondary | ICD-10-CM | POA: Diagnosis not present

## 2018-09-27 DIAGNOSIS — I493 Ventricular premature depolarization: Secondary | ICD-10-CM | POA: Diagnosis not present

## 2018-09-27 DIAGNOSIS — E039 Hypothyroidism, unspecified: Secondary | ICD-10-CM | POA: Diagnosis present

## 2018-09-27 LAB — COMPREHENSIVE METABOLIC PANEL
ALT: 19 U/L (ref 0–44)
AST: 19 U/L (ref 15–41)
Albumin: 4 g/dL (ref 3.5–5.0)
Alkaline Phosphatase: 70 U/L (ref 38–126)
Anion gap: 13 (ref 5–15)
BUN: 28 mg/dL — ABNORMAL HIGH (ref 6–20)
CO2: 29 mmol/L (ref 22–32)
Calcium: 9.4 mg/dL (ref 8.9–10.3)
Chloride: 88 mmol/L — ABNORMAL LOW (ref 98–111)
Creatinine, Ser: 1.43 mg/dL — ABNORMAL HIGH (ref 0.44–1.00)
GFR calc Af Amer: 50 mL/min — ABNORMAL LOW (ref >60)
GFR calc non Af Amer: 44 mL/min — ABNORMAL LOW (ref >60)
Glucose, Bld: 117 mg/dL — ABNORMAL HIGH (ref 70–99)
Potassium: 2.5 mmol/L — CL (ref 3.5–5.1)
Sodium: 130 mmol/L — ABNORMAL LOW (ref 135–145)
Total Bilirubin: 0.8 mg/dL (ref 0.3–1.2)
Total Protein: 8.2 g/dL — ABNORMAL HIGH (ref 6.5–8.1)

## 2018-09-27 LAB — PROTIME-INR
INR: 1.1 (ref 0.8–1.2)
Prothrombin Time: 14.4 seconds (ref 11.4–15.2)

## 2018-09-27 LAB — CBC WITH DIFFERENTIAL/PLATELET
Abs Immature Granulocytes: 0.05 10*3/uL (ref 0.00–0.07)
Basophils Absolute: 0.1 10*3/uL (ref 0.0–0.1)
Basophils Relative: 1 %
Eosinophils Absolute: 0.1 10*3/uL (ref 0.0–0.5)
Eosinophils Relative: 1 %
HCT: 44.5 % (ref 36.0–46.0)
Hemoglobin: 13.4 g/dL (ref 12.0–15.0)
Immature Granulocytes: 0 %
Lymphocytes Relative: 26 %
Lymphs Abs: 3.3 10*3/uL (ref 0.7–4.0)
MCH: 24.6 pg — ABNORMAL LOW (ref 26.0–34.0)
MCHC: 30.1 g/dL (ref 30.0–36.0)
MCV: 81.8 fL (ref 80.0–100.0)
Monocytes Absolute: 1.4 10*3/uL — ABNORMAL HIGH (ref 0.1–1.0)
Monocytes Relative: 11 %
Neutro Abs: 7.8 10*3/uL — ABNORMAL HIGH (ref 1.7–7.7)
Neutrophils Relative %: 61 %
Platelets: 343 10*3/uL (ref 150–400)
RBC: 5.44 MIL/uL — ABNORMAL HIGH (ref 3.87–5.11)
RDW: 14.6 % (ref 11.5–15.5)
WBC: 12.8 10*3/uL — ABNORMAL HIGH (ref 4.0–10.5)
nRBC: 0 % (ref 0.0–0.2)

## 2018-09-27 LAB — TSH: TSH: 8.532 u[IU]/mL — ABNORMAL HIGH (ref 0.350–4.500)

## 2018-09-27 LAB — GLUCOSE, CAPILLARY
Glucose-Capillary: 97 mg/dL (ref 70–99)
Glucose-Capillary: 127 mg/dL — ABNORMAL HIGH (ref 70–99)
Glucose-Capillary: 103 mg/dL — ABNORMAL HIGH (ref 70–99)

## 2018-09-27 LAB — POTASSIUM
Potassium: 2.9 mmol/L — ABNORMAL LOW (ref 3.5–5.1)
Potassium: 3.8 mmol/L (ref 3.5–5.1)

## 2018-09-27 LAB — DIGOXIN LEVEL: Digoxin Level: 0.3 ng/mL — ABNORMAL LOW (ref 0.8–2.0)

## 2018-09-27 LAB — TROPONIN I: Troponin I: 0.05 ng/mL (ref <0.03)

## 2018-09-27 LAB — BRAIN NATRIURETIC PEPTIDE: B Natriuretic Peptide: 404.7 pg/mL — ABNORMAL HIGH (ref 0.0–100.0)

## 2018-09-27 LAB — MAGNESIUM: Magnesium: 2.2 mg/dL (ref 1.7–2.4)

## 2018-09-27 LAB — HIV ANTIBODY (ROUTINE TESTING W REFLEX): HIV Screen 4th Generation wRfx: NONREACTIVE

## 2018-09-27 MED ORDER — ACETAMINOPHEN 325 MG PO TABS: 650.0000 mg | ORAL_TABLET | ORAL | Status: DC | PRN

## 2018-09-27 MED ORDER — ATORVASTATIN CALCIUM 40 MG PO TABS
40.0000 mg | ORAL_TABLET | Freq: Every day | ORAL | Status: DC
  Administered 2018-09-27 – 2018-09-28 (×2): 40 mg via ORAL
  Filled 2018-09-27 (×2): qty 1

## 2018-09-27 MED ORDER — POTASSIUM CHLORIDE CRYS ER 20 MEQ PO TBCR
40.0000 meq | EXTENDED_RELEASE_TABLET | Freq: Once | ORAL | Status: AC
  Administered 2018-09-27: 40 meq via ORAL
  Filled 2018-09-27: qty 2

## 2018-09-27 MED ORDER — SODIUM CHLORIDE 0.9% FLUSH
3.0000 mL | Freq: Two times a day (BID) | INTRAVENOUS | Status: DC
  Administered 2018-09-27 (×3): 3 mL via INTRAVENOUS

## 2018-09-27 MED ORDER — IVABRADINE HCL 5 MG PO TABS
5.0000 mg | ORAL_TABLET | Freq: Two times a day (BID) | ORAL | Status: DC
  Administered 2018-09-27 – 2018-09-28 (×2): 5 mg via ORAL
  Filled 2018-09-27 (×2): qty 1

## 2018-09-27 MED ORDER — SODIUM CHLORIDE 0.9% FLUSH: 3.0000 mL | INTRAVENOUS | Status: DC | PRN

## 2018-09-27 MED ORDER — POTASSIUM CHLORIDE 10 MEQ/100ML IV SOLN
10.0000 meq | INTRAVENOUS | Status: AC
  Administered 2018-09-27 (×4): 10 meq via INTRAVENOUS
  Filled 2018-09-27 (×4): qty 100

## 2018-09-27 MED ORDER — POTASSIUM CHLORIDE 10 MEQ/100ML IV SOLN
10.0000 meq | INTRAVENOUS | Status: AC
  Administered 2018-09-27 (×4): 10 meq via INTRAVENOUS
  Filled 2018-09-27 (×4): qty 100

## 2018-09-27 MED ORDER — SACUBITRIL-VALSARTAN 24-26 MG PO TABS
1.0000 | ORAL_TABLET | Freq: Two times a day (BID) | ORAL | Status: DC
  Administered 2018-09-27 – 2018-09-28 (×3): 1 via ORAL
  Filled 2018-09-27 (×3): qty 1

## 2018-09-27 MED ORDER — ONDANSETRON HCL 4 MG/2ML IJ SOLN: 4.0000 mg | Freq: Four times a day (QID) | INTRAMUSCULAR | Status: DC | PRN

## 2018-09-27 MED ORDER — SPIRONOLACTONE 25 MG PO TABS
25.0000 mg | ORAL_TABLET | Freq: Every day | ORAL | Status: DC
  Administered 2018-09-27: 25 mg via ORAL
  Filled 2018-09-27: qty 1

## 2018-09-27 MED ORDER — LEVOTHYROXINE SODIUM 25 MCG PO TABS
25.0000 ug | ORAL_TABLET | Freq: Every day | ORAL | Status: DC
  Administered 2018-09-27 – 2018-09-28 (×2): 25 ug via ORAL
  Filled 2018-09-27 (×2): qty 1

## 2018-09-27 MED ORDER — CARVEDILOL 25 MG PO TABS
25.0000 mg | ORAL_TABLET | Freq: Two times a day (BID) | ORAL | Status: DC
  Administered 2018-09-27 – 2018-09-28 (×3): 25 mg via ORAL
  Filled 2018-09-27 (×3): qty 1

## 2018-09-27 MED ORDER — AMIODARONE HCL 200 MG PO TABS
200.0000 mg | ORAL_TABLET | Freq: Every day | ORAL | Status: DC
  Administered 2018-09-27 – 2018-09-28 (×2): 200 mg via ORAL
  Filled 2018-09-27 (×2): qty 1

## 2018-09-27 MED ORDER — SODIUM CHLORIDE 0.9 % IV SOLN: 250.0000 mL | INTRAVENOUS | Status: DC | PRN

## 2018-09-27 MED ORDER — DIGOXIN 125 MCG PO TABS
125.0000 ug | ORAL_TABLET | Freq: Every day | ORAL | Status: DC
  Administered 2018-09-27 – 2018-09-28 (×2): 125 ug via ORAL
  Filled 2018-09-27 (×2): qty 1

## 2018-09-27 NOTE — Progress Notes (Signed)
Patient ID: [name], female   DOB: Sep 15, 1970, 48 y.o.   MRN: [id number]     Advanced Heart Failure Rounding Note  PCP-Cardiologist: [name], MD   Subjective:    Patient was admitted with VT x 2 with ICD discharges both times in the setting of profound hypokalemia.  In process of having K repleted.   She feels good this morning, does not feel PVCs anymore and none noted on telemetry while observing. No dyspnea.  Breathing much improved after transition to torsemide at home but currently on hold.    Objective:   Weight Range: 106.4 kg Body mass index is 41.55 kg/m.   Vital Signs:   Temp:  [97.9 F (36.6 C)-98.6 F (37 C)] 97.9 F (36.6 C) (04/06 0311) Pulse Rate:  [38-86] 79 (04/06 0900) Resp:  [13-18] 18 (04/06 0311) BP: (83-115)/(55-76) 115/76 (04/06 0311) SpO2:  [97 %-100 %] 100 % (04/06 0311) Weight:  [105.7 kg-106.4 kg] 106.4 kg (04/06 0500) Last BM Date: 09/26/18  Weight change: Filed Weights   09/27/18 0015 09/27/18 0500  Weight: 105.7 kg 106.4 kg    Intake/Output:   Intake/Output Summary (Last 24 hours) at 09/27/2018 1154 Last data filed at 09/27/2018 0327 Gross per 24 hour  Intake 3 ml  Output -  Net 3 ml      Physical Exam    General:  Well appearing. No resp difficulty HEENT: Normal Neck: Supple. JVP not elevated. Carotids 2+ bilat; no bruits. No lymphadenopathy or thyromegaly appreciated. Cor: PMI nondisplaced. Regular rate & rhythm. No rubs, gallops or murmurs. Lungs: Clear Abdomen: Soft, nontender, nondistended. No hepatosplenomegaly. No bruits or masses. Good bowel sounds. Extremities: No cyanosis, clubbing, rash, edema Neuro: Alert & orientedx3, cranial nerves grossly intact. moves all 4 extremities w/o difficulty. Affect pleasant   Telemetry   NSR in 70s, no PVCs currently.   Labs    CBC Recent Labs    09/27/18 0024  WBC 12.8*  NEUTROABS 7.8*  HGB 13.4  HCT 44.5  MCV 81.8  PLT 086   Basic Metabolic Panel Recent Labs     09/27/18 0024 09/27/18 0939  NA 130*  --   K 2.5* 2.9*  CL 88*  --   CO2 29  --   GLUCOSE 117*  --   BUN 28*  --   CREATININE 1.43*  --   CALCIUM 9.4  --   MG 2.2  --    Liver Function Tests Recent Labs    09/27/18 0024  AST 19  ALT 19  ALKPHOS 70  BILITOT 0.8  PROT 8.2*  ALBUMIN 4.0   No results for input(s): LIPASE, AMYLASE in the last 72 hours. Cardiac Enzymes Recent Labs    09/27/18 0024  TROPONINI 0.05*    BNP: BNP (last 3 results) Recent Labs    09/27/18 0024  BNP 404.7*    ProBNP (last 3 results) No results for input(s): PROBNP in the last 8760 hours.   D-Dimer No results for input(s): DDIMER in the last 72 hours. Hemoglobin A1C No results for input(s): HGBA1C in the last 72 hours. Fasting Lipid Panel No results for input(s): CHOL, HDL, LDLCALC, TRIG, CHOLHDL, LDLDIRECT in the last 72 hours. Thyroid Function Tests Recent Labs    09/27/18 0159  TSH 8.532*    Other results:   Imaging    Dg Chest Portable 1 View  Result Date: 09/27/2018 CLINICAL DATA:  Chest pain EXAM: PORTABLE CHEST 1 VIEW COMPARISON:  11/09/2014 FINDINGS: Moderate cardiomegaly  with left chest wall dual lead AICD in unchanged position. No focal airspace consolidation or pulmonary edema. No pleural effusion or pneumothorax. IMPRESSION: Cardiomegaly without pulmonary edema. Electronically Signed   By: Ulyses Jarred M.D.   On: 09/27/2018 01:16      Medications:     Scheduled Medications: . amiodarone  200 mg Oral Daily  . atorvastatin  40 mg Oral Daily  . carvedilol  25 mg Oral BID WC  . digoxin  125 mcg Oral Daily  . ivabradine  5 mg Oral BID WC  . levothyroxine  25 mcg Oral Q0600  . potassium chloride  40 mEq Oral Once  . sacubitril-valsartan  1 tablet Oral BID  . sodium chloride flush  3 mL Intravenous Q12H  . spironolactone  25 mg Oral Daily     Infusions: . sodium chloride    . potassium chloride 10 mEq (09/27/18 1126)     PRN Medications:  sodium  chloride, acetaminophen, ondansetron (ZOFRAN) IV, sodium chloride flush    Assessment/Plan   1. Frequent PVCs/VT: Now s/p ICD discharge x 2 at home.  Noted to have >10% PVCs recently on Zio patch and NSVT.  Seen by Dr. [name] last week, thought most likely electromechanical related to worsening of CHF rather than causative of her cardiomyopathy.  I adjusted her diuretics and she lost 14 lbs in less than a week.  She had VT x 2 last night. K was 2.5 at admission.  I suspect that cause of VT was hypokalemia related to augmented diuresis.  - Minimal PVCs now with replacement of K.  Will give another KCl 40 x 1 and repeat BMET this afternoon.  - Continue home amiodarone.  2. Chronic systolic HF: Nonischemic cardiomyopathy.  She has a Medtronic ICD.  Cause for cardiomyopathy is uncertain: initially noted peri-partum (after son's birth), so cannot rule out peri-partum CMP.  On and off, she has had frequent PVCs.   RHC in 8/15 showed normal filling pressures and preserved cardiac output.  She does not qualify for CRT with narrow QRS.  CPX 6/19 with low normal functional capacity.  Last echo in 10/19 showed EF 20-25% with moderate LV dilation and moderate MR. Symptomatically worse recently, NYHA class III and diuretics transitioned to torsemide with 1 dose of metolazone.  Currently, she looks euvolemic. Baseline SBP in 90s-100s.  - Hold torsemide today, would restart 80 mg daily tomorrow most likely (she has been taking 80 qam/40 qpm).  She will need a larger dose of KCl at home, likely 60 qam/40 qpm for the time being (was on 40/20).  - Continue Entresto 24/26 mg BID, no BP room to increase.    - Continue Coreg 25 mg bid and ivabradine 5 mg bid.   - Continue current spironolactone and digoxin, digoxin level ok.  3. Pulmonary HTN: Mixed picture with PVR 8.1 on RHC at [facility].  However, repeat study in 8/15 here showed no significant pulmonary hypertension.  4. Morbid obesity: She has been working on weight loss.    6. Hyperlipidemia: Good lipids in 9/19.    Length of Stay: 0  [name], MD  09/27/2018, 11:54 AM  Advanced Heart Failure Team Pager [phone] (M-F; 7a - 4p)  Please contact [facility] for night-coverage after hours (4p -7a ) and weekends on [email or web address]

## 2018-09-27 NOTE — Consult Note (Signed)
Cardiology Consultation:   Patient ID: [name] MRN: [id number]; DOB: 27-Oct-1970  Admit date: 09/27/2018 Date of Consult: 09/27/2018  Primary Care Provider: [name], MD Primary Cardiologist: Dr. [name] Primary Electrophysiologist:  Dr. [name]   Patient Profile:   [name] is a 48 y.o. female with a hx of HTN, chronic CHF (systolic), NICM (? If peri-partum) w/ICD, p.HTN, morbid obesity, HLD, PVCs/NSVT who is being seen today for the evaluation of ICD shock at the request of Dr. Raiford Simmonds.  Device information: MDT single chamber ICD, implanted 12/26/13  Carelink transmission: Battery and lead measurements are ok One treated episode in VF zone, PMVT failed ATP x1 > one shock successful One NSVT   History of Present Illness:   [name] was recently seen by Dr. [name] (09/22/2018), felt to be fluid OL, her lasix changed to torsemide and a single dose of metolazone.  He also noted that despite the addition of amiodarone (200mg  BID on 08/20/2018) her PVC burden continued to rise, suspect perhaps 2/2 fluid OL.  She was admitted to [facility] overnight/early this AM with VF in the setting of marked hypokalemia.  LABS K+ 2.5 (ordered for 4 runs of 48meq and given 87meq PO) Mag 2.2 BUN/Creat 28/1.43 BNP 404 Trop I: 0.05 WBC 12.8 H/H 13/44 Plts 343 TSH 8.532  (high, though down from 12.027)   Past Medical History:  Diagnosis Date  . Automatic implantable cardioverter-defibrillator in situ   . Chronic CHF (congestive heart failure) (HCC)    a. EF 15-20% b. RHC (09/2013) RA 14, RV 57/22, PA 64/36 (48), PCWP 18, FIck CO/CI 3.7/1.6, PVR 8.1 WU, PA sat 47%   . History of stomach ulcers   . Hypertension   . Hypotension   . Hypothyroidism   . Morbid obesity ([place])   . Myocardial infarction ([place]) 08/2013  . Nocturnal dyspnea   . Nonischemic cardiomyopathy ([place])   . Sinus tachycardia   . Snoring-prob OSA 09/04/2011  . Sprint Fidelis ICD lead RECALL  [id number]     Past Surgical History:   Procedure Laterality Date  . BREATH TEK H PYLORI N/A 11/09/2014   Procedure: BREATH TEK H PYLORI;  Surgeon: Greer Pickerel, MD;  Location: [facility];  Service: General;  Laterality: N/A;  . CARDIAC CATHETERIZATION  ~ 2006; 09/2013  . CARDIAC CATHETERIZATION N/A 05/23/2016   Procedure: Right Heart Cath;  Surgeon: Larey Dresser, MD;  Location: [facility];  Service: Cardiovascular;  Laterality: N/A;  . CARDIAC DEFIBRILLATOR PLACEMENT  2006; 12/26/2013   Medtronic [id number]; 12-2013 ICD gen change and RV lead revision with new 3846 RV lead by Dr [name]  . CESAREAN SECTION  1999  . IMPLANTABLE CARDIOVERTER DEFIBRILLATOR GENERATOR CHANGE N/A 12/26/2013   Procedure: IMPLANTABLE CARDIOVERTER DEFIBRILLATOR GENERATOR CHANGE;  Surgeon: Deboraha Sprang, MD;  Location: [facility] CATH LAB;  Service: Cardiovascular;  Laterality: N/A;  . LEAD REVISION N/A 12/26/2013   Procedure: LEAD REVISION;  Surgeon: Deboraha Sprang, MD;  Location: [facility] CATH LAB;  Service: Cardiovascular;  Laterality: N/A;  . RIGHT HEART CATHETERIZATION N/A 02/15/2014   Procedure: RIGHT HEART CATH;  Surgeon: Larey Dresser, MD;  Location: [facility] CATH LAB;  Service: Cardiovascular;  Laterality: N/A;  . TUBAL LIGATION  1999     Home Medications:  Prior to Admission medications   Medication Sig Start Date End Date Taking? Authorizing Provider  amiodarone (PACERONE) 200 MG tablet Take 200 mg by mouth daily.   Yes [provider]  atorvastatin (LIPITOR)  40 MG tablet TAKE 1 TABLET BY MOUTH ONCE DAILY Patient taking differently: Take 40 mg by mouth daily at 6 PM.  09/09/18  Yes Larey Dresser, MD  carvedilol (COREG) 25 MG tablet TAKE 1 TABLET BY MOUTH TWICE A DAY WITH A MEAL Patient taking differently: Take 25 mg by mouth 2 (two) times daily with a meal.  08/10/18  Yes Larey Dresser, MD  digoxin (LANOXIN) 0.125 MG tablet TAKE 1 TABLET BY MOUTH ONCE DAILY Patient taking differently: Take 0.125 mg by mouth daily.  09/09/18  Yes Larey Dresser, MD  ENTRESTO 24-26 MG TAKE 1 TABLET BY MOUTH TWICE A DAY Patient taking differently: Take 1 tablet by mouth 2 (two) times daily.  09/09/18  Yes Larey Dresser, MD  ivabradine (CORLANOR) 5 MG TABS tablet Take 1 tablet (5 mg total) by mouth 2 (two) times daily with a meal. 08/25/17  Yes Larey Dresser, MD  levothyroxine (SYNTHROID) 25 MCG tablet Take 1 tablet (25 mcg total) by mouth daily before breakfast. 09/23/18  Yes Deboraha Sprang, MD  metFORMIN (GLUCOPHAGE) 500 MG tablet Take 1 tablet (500 mg total) by mouth daily with breakfast. 07/27/18  Yes Abby Potash, PA-C  metolazone (ZAROXOLYN) 2.5 MG tablet Take 1 tablet (2.5 mg total) by mouth daily. Patient taking differently: Take 2.5 mg by mouth daily as needed (for fluid).  09/22/18 12/21/18 Yes Larey Dresser, MD  potassium chloride SA (K-DUR,KLOR-CON) 20 MEQ tablet Take 2 tablets (40 mEq total) every morning and 1 tablet (20 mEq total) every evening. Patient taking differently: Take 20-40 mEq by mouth See admin instructions. Take 2 tablets (40 mEq total) every morning and 1 tablet (20 mEq total) every evening. 09/17/18  Yes Larey Dresser, MD  spironolactone (ALDACTONE) 25 MG tablet TAKE 1 TABLET BY MOUTH ONCE A DAY Patient taking differently: Take 25 mg by mouth daily.  08/10/18  Yes Larey Dresser, MD  torsemide (DEMADEX) 20 MG tablet Take 4 tablets (80 mg total) by mouth 2 (two) times daily. 09/22/18 12/21/18 Yes Larey Dresser, MD  Vitamin D, Ergocalciferol, (DRISDOL) 1.25 MG (50000 UT) CAPS capsule Take 1 capsule (50,000 Units total) by mouth every 7 (seven) days. Patient taking differently: Take 50,000 Units by mouth every Wednesday.  07/27/18  Yes Abby Potash, PA-C  amiodarone (PACERONE) 200 MG tablet Take 1 tablet (200 mg total) by mouth 2 (two) times daily for 10 days, THEN 1 tablet (200 mg total) daily. Patient not taking: Reported on 09/27/2018 08/20/18 08/30/19  Larey Dresser, MD    Inpatient Medications: Scheduled Meds:  . amiodarone  200 mg Oral Daily  . atorvastatin  40 mg Oral Daily  . carvedilol  25 mg Oral BID WC  . digoxin  125 mcg Oral Daily  . levothyroxine  25 mcg Oral Q0600  . sacubitril-valsartan  1 tablet Oral BID  . sodium chloride flush  3 mL Intravenous Q12H  . spironolactone  25 mg Oral Daily   Continuous Infusions: . sodium chloride     PRN Meds: sodium chloride, acetaminophen, ondansetron (ZOFRAN) IV, sodium chloride flush  Allergies:   No Known Allergies  Social History:   Social History   Socioeconomic History  . Marital status: Married    Spouse name: Aziya Arena  . Number of children: 2  . Years of education: Not on file  . Highest education level: Not on file  Occupational History  . Occupation: Firefighter:  DISABILITY  Social Needs  . Financial resource strain: Not on file  . Food insecurity:    Worry: Not on file    Inability: Not on file  . Transportation needs:    Medical: Not on file    Non-medical: Not on file  Tobacco Use  . Smoking status: Never Smoker  . Smokeless tobacco: Never Used  Substance and Sexual Activity  . Alcohol use: No  . Drug use: No  . Sexual activity: Yes  Lifestyle  . Physical activity:    Days per week: Not on file    Minutes per session: Not on file  . Stress: Not on file  Relationships  . Social connections:    Talks on phone: Not on file    Gets together: Not on file    Attends religious service: Not on file    Active member of club or organization: Not on file    Attends meetings of clubs or organizations: Not on file    Relationship status: Not on file  . Intimate partner violence:    Fear of current or ex partner: Not on file    Emotionally abused: Not on file    Physically abused: Not on file    Forced sexual activity: Not on file  Other Topics Concern  . Not on file  Social History Narrative  . Not on file    Family History:   Family History  Problem Relation Age of Onset  . Heart disease  Maternal Grandmother   . Heart disease Mother   . Obesity Mother   . High blood pressure Father   . Stroke Father      ROS:  Please see the history of present illness.  All other ROS reviewed and negative.     Physical Exam/Data:   Vitals:   09/27/18 0145 09/27/18 0246 09/27/18 0311 09/27/18 0500  BP: (!) 88/55  115/76   Pulse: (!) 39  86   Resp: 15  18   Temp:  98.6 F (37 C) 97.9 F (36.6 C)   TempSrc:  Oral Oral   SpO2: 97%  100%   Weight:    106.4 kg  Height:        Intake/Output Summary (Last 24 hours) at 09/27/2018 0821 Last data filed at 09/27/2018 0327 Gross per 24 hour  Intake 3 ml  Output -  Net 3 ml   Last 3 Weights 09/27/2018 09/27/2018 09/22/2018  Weight (lbs) 234 lb 9.1 oz 233 lb 236 lb 12.8 oz  Weight (kg) 106.4 kg 105.688 kg 107.412 kg     Body mass index is 41.55 kg/m.  General:  Well nourished, obese, well developed, in no acute distress HEENT: normal Lymph: no adenopathy Neck: no JVD Endocrine:  No thryomegaly Vascular: No carotid bruits; FA pulses 2+ bilaterally without bruits  Cardiac:  normal S1, S2; RRR; no murmur  Lungs:  clear to auscultation bilaterally, no wheezing, rhonchi or rales  Abd: soft, nontender, no hepatomegaly  Ext: no edema Musculoskeletal:  No deformities, BUE and BLE strength normal and equal Skin: warm and dry  Neuro:  CNs 2-12 intact, no focal abnormalities noted Psych:  Normal affect   EKG:  The EKG was personally reviewed and demonstrates:    SR, IVCD, PVCs  Telemetry:  Telemetry was personally reviewed and demonstrates:   nsr  Relevant CV Studies:  RHC 05/23/16: RA mean 5 RV 55/8 PA 56/31, mean 40 PCWP mean 24 Oxygen saturations: PA 61% AO 99% Cardiac  Output (Fick) 4.3  Cardiac Index (Fick) 2.0 PVR 3.7 WU  RHC 09/24/13  RA 14  RV 57/22  PA 64/36 (48)  PCWP 18  Fick CO/CI: 3.7/1.6  PVR 8.1 WU  Ao sat 95%  PA sat 47%   CPX 06/27/2016  Peak VO2: 13.5 (56.2% predicted peak VO2) VE/VCO2 slope: 33  OUES: 2.16 Peak RER: 0.98  CPX  1/18 Peak VO2 13.5 VE/VCO2 slope 33 RER 0.98 Submaximal study, mild circulatory limitation, suspect more limited by body habitus.   Echo (8/15) with EF 20%, moderate LV dilation, diffuse hypokinesis, normal RV size with mildly decreased systolic function, mild MR.  Echo (5/16) with EF 20%, spherical LV with diffuse hypokinesis, mild MR.  Echo (6/17) with EF 20%, diffuse hypokinesis, moderate LV dilation, normal RV, mild to moderate MR.  Echo (8/18) with EF 25-30%, severe LV dilation, severe LAE.  Echo (2/20) with EF 20-25%, moderate LV dilation, moderate MR.   Sleep study did not show significant OSA.   09/03/2018: ZIO: Dr. [name] PVC percentage up to 13.3% with NSVT runs. Would start on amiodarone 200 mg bid x 14 days then 200 mg daily. Will repeat Zio patch in future on amiodarone to make sure suppressed. I am concerned that PVCs could be contributing to her cardiomyopathy. Given young age, would prefer not to have her on amiodarone long-term. Please refer back over to EP for evaluation of NSVT/PVCs. She has an ICD.  08/19/2018: ZIO Dr. [name] Frequent PVCs, 11% total beats with runs of NSVT. Has ICD. May be playing a role in cardiomyopathy. Would recommend starting amiodarone 200 mg bid x 10 days then 200 mg daily. Will have her repeat Zio patch when amiodarone loaded. Make sure she has followup with EP.  Holter (8/15) with < 1% PVCs  CPX 9/16 Peak VO2 16.2 VE/VCO2 slope 23.6 RER 1.25 Normal functional capacity  CPX (6/19): peak VO2 14.3 (83% predicted), VE/VCO2 slope 30 => low normal functional capacity.    Laboratory Data:  Chemistry Recent Labs  Lab 09/22/18 1047 09/27/18 0024  NA 139 130*  K 4.0 2.5*  CL 106 88*  CO2 22 29  GLUCOSE 96 117*  BUN 17 28*  CREATININE 1.37* 1.43*  CALCIUM 9.5 9.4  GFRNONAA 46* 44*  GFRAA 53* 50*  ANIONGAP 11 13    Recent Labs  Lab 09/22/18 1047 09/27/18 0024  PROT 7.0 8.2*   ALBUMIN 3.6 4.0  AST 19 19  ALT 26 19  ALKPHOS 66 70  BILITOT 1.3* 0.8   Hematology Recent Labs  Lab 09/27/18 0024  WBC 12.8*  RBC 5.44*  HGB 13.4  HCT 44.5  MCV 81.8  MCH 24.6*  MCHC 30.1  RDW 14.6  PLT 343   Cardiac Enzymes Recent Labs  Lab 09/27/18 0024  TROPONINI 0.05*   No results for input(s): TROPIPOC in the last 168 hours.  BNP Recent Labs  Lab 09/27/18 0024  BNP 404.7*    DDimer No results for input(s): DDIMER in the last 168 hours.  Radiology/Studies:   Dg Chest Portable 1 View Result Date: 09/27/2018 CLINICAL DATA:  Chest pain EXAM: PORTABLE CHEST 1 VIEW COMPARISON:  11/09/2014 FINDINGS: Moderate cardiomegaly with left chest wall dual lead AICD in unchanged position. No focal airspace consolidation or pulmonary edema. No pleural effusion or pneumothorax. IMPRESSION: Cardiomegaly without pulmonary edema. Electronically Signed   By: Ulyses Jarred M.D.   On: 09/27/2018 01:16    Assessment and Plan:   1. ICD therapy 2. PMVT  Most likely 2/2 hypokalemia      Of late with increasing PVC burden, suspect to be 2/2 fluid OL      Continue amiodarone  Dr. [name] mentions ? If PVCs may be contributing to CM.  Planned for short term amiodarone and f/u with dr. [name]. Appears her LVEF has been about the same 20-25% going back years with one in 2018 at best 25-30%.  Not sure the PVCs are a contributor to her low EF or a symptom of it   3. Hypokalemia     Has been ordered/getting replacement      f/u K+ only 2.9, replacement ordered in d/w gen cards APP with another f/u planned for this afternoon  4. Acute on Chronic CHF (systolic)      cxr does not suggest fluid OL           Ordered for home regime, BB/Entresto, dig, aldactone (no other diuretics yet)  5. AKI     Most likely 2/2 diuresis           BMET in AM ordered   For questions or updates, please contact [place] Please consult [email or web address] for contact info under   Signed, Baldwin Jamaica, PA-C  09/27/2018 8:21 AM  EP Attending  Patient seen and examined. Agree with above. Discussed with Dr. [name]. The patient has had VF and been successfully defibrillated back to NSR in the setting of marked hypokalemia. I have reviewed her ICD interogation and her device is working normally. She will need either less diuresis, more aldactone and potassium. She can be discharged home when her potassium is improved. No changes in her programming of her device were made today.  Mikle Bosworth.D.

## 2018-09-27 NOTE — ED Triage Notes (Signed)
Pt reports her defibrillator went off around 1545 today and again tonight just after 11 (she had just fallen asleep). Pt says that she feels "weak and tired". Initially had some chest discomfort, but subsided now. No sob, cough, or fevers.

## 2018-09-27 NOTE — ED Provider Notes (Signed)
[facility]GENC[facility] DEPARTMENT Provider Note   CSN: [id number] Arrival date & time: 09/27/18  0010    History   Chief Complaint Chief Complaint  Patient presents with  . AICD Problem    HPI [name] is a 48 y.o. female.     Patient presents with discharge of her defibrillator x2 since 345 this afternoon.  Patient has nonischemic cardiomyopathy with EF of 15 to 20%.  States she went to lay down to have a nap around 3:45 PM.  After falling asleep her family states she was shocked by her defibrillator x1.  This happened again tonight about 1145.  After the shock she had some left-sided chest pain which is since resolved.  Patient states she is felt excessively fatigued and tired her chest pain has since resolved.  She has no cough, fever, chills, nausea or vomiting.  She states compliance with her medications.  Her diuretics were recently switched from Lasix to torsemide which she maintains compliance with her other medications.  Mild leg swelling but no pain.  The history is provided by the patient. The history is limited by the condition of the patient.    Past Medical History:  Diagnosis Date  . Automatic implantable cardioverter-defibrillator in situ   . Chronic CHF (congestive heart failure) (HCC)    a. EF 15-20% b. RHC (09/2013) RA 14, RV 57/22, PA 64/36 (48), PCWP 18, FIck CO/CI 3.7/1.6, PVR 8.1 WU, PA sat 47%   . History of stomach ulcers   . Hypertension   . Hypotension   . Hypothyroidism   . Morbid obesity ([place])   . Myocardial infarction ([place]) 08/2013  . Nocturnal dyspnea   . Nonischemic cardiomyopathy ([place])   . Sinus tachycardia   . Snoring-prob OSA 09/04/2011  . Sprint Fidelis ICD lead RECALL  [phone]     Patient Active Problem List   Diagnosis Date Noted  . Vitamin D deficiency 10/19/2017  . Insulin resistance 10/19/2017  . Hypotension 06/03/2016  . Volume depletion 06/03/2016  . Preoperative cardiovascular examination 10/25/2014  .  Pulmonary hypertension ([place]) 02/07/2014  . Sprint Fidelis ICD lead RECALL  [id number]   . UARS (upper airway resistance syndrome) 09/04/2011  . Encounter for fitting or adjustment of implantable cardioverter-defibrillator (ICD)   . Obesity 03/12/2009  . CARDIOMYOPATHY, PRIMARY, DILATED 03/12/2009  . SYSTOLIC HEART FAILURE, CHRONIC 03/12/2009    Past Surgical History:  Procedure Laterality Date  . BREATH TEK H PYLORI N/A 11/09/2014   Procedure: BREATH TEK H PYLORI;  Surgeon: Greer Pickerel, MD;  Location: [facility];  Service: General;  Laterality: N/A;  . CARDIAC CATHETERIZATION  ~ 2006; 09/2013  . CARDIAC CATHETERIZATION N/A 05/23/2016   Procedure: Right Heart Cath;  Surgeon: Larey Dresser, MD;  Location: [facility];  Service: Cardiovascular;  Laterality: N/A;  . CARDIAC DEFIBRILLATOR PLACEMENT  2006; 12/26/2013   Medtronic [id number]; 12-2013 ICD gen change and RV lead revision with new 5631 RV lead by Dr [name]  . CESAREAN SECTION  1999  . IMPLANTABLE CARDIOVERTER DEFIBRILLATOR GENERATOR CHANGE N/A 12/26/2013   Procedure: IMPLANTABLE CARDIOVERTER DEFIBRILLATOR GENERATOR CHANGE;  Surgeon: Deboraha Sprang, MD;  Location: [facility] CATH LAB;  Service: Cardiovascular;  Laterality: N/A;  . LEAD REVISION N/A 12/26/2013   Procedure: LEAD REVISION;  Surgeon: Deboraha Sprang, MD;  Location: [facility] CATH LAB;  Service: Cardiovascular;  Laterality: N/A;  . RIGHT HEART CATHETERIZATION N/A 02/15/2014   Procedure: RIGHT HEART CATH;  Surgeon: Larey Dresser,  MD;  Location: [facility] CATH LAB;  Service: Cardiovascular;  Laterality: N/A;  . TUBAL LIGATION  1999     OB History    Gravida  2   Para  2   Term      Preterm      AB      Living  2     SAB      TAB      Ectopic      Multiple      Live Births               Home Medications    Prior to Admission medications   Medication Sig Start Date End Date Taking? Authorizing Provider  amiodarone (PACERONE) 200 MG tablet Take 1 tablet (200 mg  total) by mouth 2 (two) times daily for 10 days, THEN 1 tablet (200 mg total) daily. 08/20/18 08/30/19  Larey Dresser, MD  atorvastatin (LIPITOR) 40 MG tablet TAKE 1 TABLET BY MOUTH ONCE DAILY 09/09/18   Larey Dresser, MD  carvedilol (COREG) 25 MG tablet TAKE 1 TABLET BY MOUTH TWICE A DAY WITH A MEAL 08/10/18   Larey Dresser, MD  digoxin (LANOXIN) 0.125 MG tablet TAKE 1 TABLET BY MOUTH ONCE DAILY 09/09/18   Larey Dresser, MD  ENTRESTO 24-26 MG TAKE 1 TABLET BY MOUTH TWICE A DAY 09/09/18   Larey Dresser, MD  ivabradine (CORLANOR) 5 MG TABS tablet Take 1 tablet (5 mg total) by mouth 2 (two) times daily with a meal. 08/25/17   Larey Dresser, MD  levothyroxine (SYNTHROID) 25 MCG tablet Take 1 tablet (25 mcg total) by mouth daily before breakfast. 09/23/18   Deboraha Sprang, MD  metFORMIN (GLUCOPHAGE) 500 MG tablet Take 1 tablet (500 mg total) by mouth daily with breakfast. 07/27/18   Abby Potash, PA-C  metolazone (ZAROXOLYN) 2.5 MG tablet Take 1 tablet (2.5 mg total) by mouth daily. 09/22/18 12/21/18  Larey Dresser, MD  potassium chloride SA (K-DUR,KLOR-CON) 20 MEQ tablet Take 2 tablets (40 mEq total) every morning and 1 tablet (20 mEq total) every evening. 09/17/18   Larey Dresser, MD  spironolactone (ALDACTONE) 25 MG tablet TAKE 1 TABLET BY MOUTH ONCE A DAY 08/10/18   Larey Dresser, MD  torsemide (DEMADEX) 20 MG tablet Take 4 tablets (80 mg total) by mouth 2 (two) times daily. 09/22/18 12/21/18  Larey Dresser, MD  Vitamin D, Ergocalciferol, (DRISDOL) 1.25 MG (50000 UT) CAPS capsule Take 1 capsule (50,000 Units total) by mouth every 7 (seven) days. 07/27/18   Abby Potash, PA-C    Family History Family History  Problem Relation Age of Onset  . Heart disease Maternal Grandmother   . Heart disease Mother   . Obesity Mother   . High blood pressure Father   . Stroke Father     Social History Social History   Tobacco Use  . Smoking status: Never Smoker  . Smokeless tobacco: Never  Used  Substance Use Topics  . Alcohol use: No  . Drug use: No     Allergies   Patient has no known allergies.   Review of Systems Review of Systems  Constitutional: Negative for activity change, appetite change and fever.  HENT: Negative for congestion and rhinorrhea.   Eyes: Negative for photophobia.  Respiratory: Positive for chest tightness. Negative for cough and shortness of breath.   Cardiovascular: Positive for chest pain, palpitations and leg swelling.  Gastrointestinal: Negative for abdominal pain, nausea  and vomiting.  Genitourinary: Negative for dysuria, hematuria, vaginal bleeding and vaginal discharge.  Musculoskeletal: Negative for back pain and myalgias.  Skin: Negative for rash.  Neurological: Positive for weakness. Negative for dizziness and headaches.    all other systems are negative except as noted in the HPI and PMH.    Physical Exam Updated Vital Signs BP (!) 83/70 (BP Location: Right Arm)   Pulse 83   Temp 98.3 F (36.8 C) (Oral)   Resp 13   Ht 5\' 3"  (1.6 m)   Wt 105.7 kg   SpO2 99%   BMI 41.27 kg/m   Physical Exam Vitals signs and nursing note reviewed.  Constitutional:      General: She is not in acute distress.    Appearance: She is well-developed. She is obese.  HENT:     Head: Normocephalic and atraumatic.     Mouth/Throat:     Pharynx: No oropharyngeal exudate.  Eyes:     Conjunctiva/sclera: Conjunctivae normal.     Pupils: Pupils are equal, round, and reactive to light.  Neck:     Musculoskeletal: Normal range of motion and neck supple.     Comments: No meningismus. Cardiovascular:     Rate and Rhythm: Normal rate and regular rhythm.     Heart sounds: Normal heart sounds. No murmur.     Comments: AICD L chest Pulmonary:     Effort: Pulmonary effort is normal. No respiratory distress.     Breath sounds: Normal breath sounds.  Chest:     Chest wall: No tenderness.  Abdominal:     Palpations: Abdomen is soft.     Tenderness:  There is no abdominal tenderness. There is no guarding or rebound.  Musculoskeletal: Normal range of motion.        General: No tenderness.     Right lower leg: Edema present.     Left lower leg: Edema present.  Skin:    General: Skin is warm.     Capillary Refill: Capillary refill takes less than 2 seconds.  Neurological:     General: No focal deficit present.     Mental Status: She is alert and oriented to person, place, and time. Mental status is at baseline.     Cranial Nerves: No cranial nerve deficit.     Motor: No abnormal muscle tone.     Coordination: Coordination normal.     Comments:  5/5 strength throughout. CN 2-12 intact.Equal grip strength.   Psychiatric:        Behavior: Behavior normal.      ED Treatments / Results  Labs (all labs ordered are listed, but only abnormal results are displayed) Labs Reviewed  CBC WITH DIFFERENTIAL/PLATELET - Abnormal; Notable for the following components:      Result Value   WBC 12.8 (*)    RBC 5.44 (*)    MCH 24.6 (*)    Neutro Abs 7.8 (*)    Monocytes Absolute 1.4 (*)    All other components within normal limits  COMPREHENSIVE METABOLIC PANEL - Abnormal; Notable for the following components:   Sodium 130 (*)    Potassium 2.5 (*)    Chloride 88 (*)    Glucose, Bld 117 (*)    BUN 28 (*)    Creatinine, Ser 1.43 (*)    Total Protein 8.2 (*)    GFR calc non Af Amer 44 (*)    GFR calc Af Amer 50 (*)    All other components within normal  limits  DIGOXIN LEVEL - Abnormal; Notable for the following components:   Digoxin Level 0.3 (*)    All other components within normal limits  BRAIN NATRIURETIC PEPTIDE - Abnormal; Notable for the following components:   B Natriuretic Peptide 404.7 (*)    All other components within normal limits  TROPONIN I - Abnormal; Notable for the following components:   Troponin I 0.05 (*)    All other components within normal limits  MAGNESIUM  PROTIME-INR  HIV ANTIBODY (ROUTINE TESTING W REFLEX)   TSH    EKG EKG Interpretation  Date/Time:  Monday September 27 2018 00:31:58 EDT Ventricular Rate:  79 PR Interval:    QRS Duration: 135 QT Interval:  412 QTC Calculation: 473 R Axis:   -20 Text Interpretation:  Sinus rhythm Ventricular premature complex Probable left atrial enlargement LVH with IVCD and secondary repol abnrm Anterior ST elevation, probably due to LVH No significant change was found Confirmed by Ezequiel Essex [phone]) on 09/27/2018 12:39:13 AM   Radiology Dg Chest Portable 1 View  Result Date: 09/27/2018 CLINICAL DATA:  Chest pain EXAM: PORTABLE CHEST 1 VIEW COMPARISON:  11/09/2014 FINDINGS: Moderate cardiomegaly with left chest wall dual lead AICD in unchanged position. No focal airspace consolidation or pulmonary edema. No pleural effusion or pneumothorax. IMPRESSION: Cardiomegaly without pulmonary edema. Electronically Signed   By: Ulyses Jarred M.D.   On: 09/27/2018 01:16    Procedures Procedures (including critical care time)  Medications Ordered in ED Medications - No data to display   Initial Impression / Assessment and Plan / ED Course  I have reviewed the triage vital signs and the nursing notes.  Pertinent labs & imaging results that were available during my care of the patient were reviewed by me and considered in my medical decision making (see chart for details).       AICD discharge x2 with transient chest pain.  EKG shows PVCs.  Brief episode of chest pain earlier has resolved.  Interrogation of pacemaker shows that patient had 2 shocks this evening.  Labs show profound hypokalemia of 2.5 which may be contributing.  This is aggressively repleted. Magnesium is normal.   Discussed with cardiology Dr. Raiford Simmonds  Who will evaluate patient for admission.  CRITICAL CARE Performed by: Ezequiel Essex Total critical care time: 32 minutes Critical care time was exclusive of separately billable procedures and treating other patients. Critical care was  necessary to treat or prevent imminent or life-threatening deterioration. Critical care was time spent personally by me on the following activities: development of treatment plan with patient and/or surrogate as well as nursing, discussions with consultants, evaluation of patient's response to treatment, examination of patient, obtaining history from patient or surrogate, ordering and performing treatments and interventions, ordering and review of laboratory studies, ordering and review of radiographic studies, pulse oximetry and re-evaluation of patient's condition.     Final Clinical Impressions(s) / ED Diagnoses   Final diagnoses:  AICD discharge  Hypokalemia    ED Discharge Orders    None       Emylee Decelle, Annie Main, MD 09/27/18 [phone]

## 2018-09-27 NOTE — H&P (Signed)
Patient ID: [name] MRN: [id number], DOB/AGE: 1970/11/04   Admit date: 09/27/2018 Date of Consult: 09/27/2018  Primary Physician: [name], MD Primary Cardiologist: No primary care provider on file.   Past Medical History   Past Medical History:  Diagnosis Date  . Automatic implantable cardioverter-defibrillator in situ   . Chronic CHF (congestive heart failure) (HCC)    a. EF 15-20% b. RHC (09/2013) RA 14, RV 57/22, PA 64/36 (48), PCWP 18, FIck CO/CI 3.7/1.6, PVR 8.1 WU, PA sat 47%   . History of stomach ulcers   . Hypertension   . Hypotension   . Hypothyroidism   . Morbid obesity ([place])   . Myocardial infarction ([place]) 08/2013  . Nocturnal dyspnea   . Nonischemic cardiomyopathy ([place])   . Sinus tachycardia   . Snoring-prob OSA 09/04/2011  . Sprint Fidelis ICD lead RECALL  [id number]     Past Surgical History:  Procedure Laterality Date  . BREATH TEK H PYLORI N/A 11/09/2014   Procedure: BREATH TEK H PYLORI;  Surgeon: Greer Pickerel, MD;  Location: [facility];  Service: General;  Laterality: N/A;  . CARDIAC CATHETERIZATION  ~ 2006; 09/2013  . CARDIAC CATHETERIZATION N/A 05/23/2016   Procedure: Right Heart Cath;  Surgeon: Larey Dresser, MD;  Location: [facility] CV LAB;  Service: Cardiovascular;  Laterality: N/A;  . CARDIAC DEFIBRILLATOR PLACEMENT  2006; 12/26/2013   Medtronic [id number]; 12-2013 ICD gen change and RV lead revision with new 5361 RV lead by Dr [name]  . CESAREAN SECTION  1999  . IMPLANTABLE CARDIOVERTER DEFIBRILLATOR GENERATOR CHANGE N/A 12/26/2013   Procedure: IMPLANTABLE CARDIOVERTER DEFIBRILLATOR GENERATOR CHANGE;  Surgeon: Deboraha Sprang, MD;  Location: [facility] CATH LAB;  Service: Cardiovascular;  Laterality: N/A;  . LEAD REVISION N/A 12/26/2013   Procedure: LEAD REVISION;  Surgeon: Deboraha Sprang, MD;  [facility]: [facility] CATH LAB;  Service: Cardiovascular;  Laterality: N/A;  . RIGHT HEART CATHETERIZATION N/A 02/15/2014   Procedure: RIGHT HEART CATH;  Surgeon: Larey Dresser, MD;  Location: [facility] CATH LAB;  Service: Cardiovascular;  Laterality: N/A;  . TUBAL LIGATION  1999     Allergies  No Known Allergies  History of Present Illness    48 y.o. 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 Recently noted to have increased PVC burden.  Was found to have a PVC burden of 13% last month.  On amiodarone.  She was switched from furosemide to torsemide given concern for congestion.  She responded very well to diuresis.  Also occasional metolazone.  Patient presents with a history of 2 ICD shocks within 1 year for hours.  Both time she was asleep.  1 at 3 AM and 1 and 11 PM.  After the second episode she had some short-term seizure-like activity according to her family. Denies chest pains, shortness of breath, fever, chills, cough.  He is on a second ICD.  The first ICD went in 2000.  She had never had shocks prior to today. Inpatient Medications    . amiodarone  200 mg Oral Daily  . atorvastatin  40 mg Oral Daily  . carvedilol  25 mg Oral BID WC  . digoxin  125 mcg Oral Daily  . levothyroxine  25 mcg Oral QAC breakfast  . potassium chloride  40 mEq Oral Once  . sacubitril-valsartan  1 tablet Oral BID  . sodium chloride flush  3 mL Intravenous Q12H  .  spironolactone  25 mg Oral Daily    Family History    Family History  Problem Relation Age of Onset  . Heart disease Maternal Grandmother   . Heart disease Mother   . Obesity Mother   . High blood pressure Father   . Stroke Father    She indicated that her mother is deceased. She indicated that her father is alive. She indicated that her maternal grandmother is deceased. She indicated that her maternal grandfather is deceased. She indicated that her paternal grandmother is deceased. She indicated that her paternal grandfather is deceased.   Social History    Social History   Socioeconomic History  . Marital  status: Married    Spouse name: Harneet Noblett  . Number of children: 2  . Years of education: Not on file  . Highest education level: Not on file  Occupational History  . 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 Financial resource strain: Not on file  . Food insecurity:    Worry: Not on file    Inability: Not on file  . Transportation needs:    Medical: Not on file    Non-medical: Not on file  Tobacco Use  . Smoking status: Never Smoker  . Smokeless tobacco: Never Used  Substance and Sexual Activity  . Alcohol use: No  . Drug use: No  . Sexual activity: Yes  Lifestyle  . Physical activity:    Days per week: Not on file    Minutes per session: Not on file  . Stress: Not on file  Relationships  . Social connections:    Talks on phone: Not on file    Gets together: Not on file    Attends religious service: Not on file    Active member of club or organization: Not on file    Attends meetings of clubs or organizations: Not on file    Relationship status: Not on file  . Intimate partner violence:    Fear of current or ex partner: Not on file    Emotionally abused: Not on file    Physically abused: Not on file    Forced sexual activity: Not on file  Other Topics Concern  . Not on file  Social History Narrative  . Not on file     Review of Systems    General:  No chills, fever, night sweats or weight changes.  Cardiovascular:  No chest pain, dyspnea on exertion, edema, orthopnea, palpitations, paroxysmal nocturnal dyspnea. Dermatological: No rash, lesions/masses Respiratory: No cough, dyspnea Urologic: No hematuria, dysuria Abdominal:   No nausea, vomiting, diarrhea, bright red blood per rectum, melena, or hematemesis Neurologic:  No visual changes, wkns, changes in mental status. All other systems reviewed and are otherwise negative except as noted above.  Physical Exam    Blood pressure (!) 83/70, pulse 83, temperature 98.3 F (36.8 C),  temperature source Oral, resp. rate 13, height 5\' 3"  (1.6 m), weight 105.7 kg, SpO2 99 %.  General: Pleasant, NAD Psych: Normal affect. Neuro: Alert and oriented X 3. Moves all extremities spontaneously. HEENT: Normal  Neck: Supple without bruits or JVD. Lungs:  Resp regular and unlabored, CTA. Heart: RRR no s3, s4, or murmurs. Abdomen: Soft, non-tender, non-distended, BS + x 4.  Extremities: No clubbing, cyanosis or edema. DP/PT/Radials 2+ and equal bilaterally.  Labs    Troponin (Point of Care Test) No results for input(s): TROPIPOC in the last 72 hours. No results for input(s):  CKTOTAL, CKMB, TROPONINI in the last 72 hours. Lab Results  Component Value Date   WBC 12.8 (H) 09/27/2018   HGB 13.4 09/27/2018   HCT 44.5 09/27/2018   MCV 81.8 09/27/2018   PLT 343 09/27/2018    Recent Labs  Lab 09/27/18 0024  NA 130*  K 2.5*  CL 88*  CO2 29  BUN 28*  CREATININE 1.43*  CALCIUM 9.4  PROT 8.2*  BILITOT 0.8  ALKPHOS 70  ALT 19  AST 19  GLUCOSE 117*   Lab Results  Component Value Date   CHOL 171 08/17/2018   HDL 46 08/17/2018   LDLCALC 103 (H) 08/17/2018   TRIG 111 08/17/2018   No results found for: [facility]   Radiology Studies    Dg Chest Portable 1 View  Result Date: 09/27/2018 CLINICAL DATA:  Chest pain EXAM: PORTABLE CHEST 1 VIEW COMPARISON:  11/09/2014 FINDINGS: Moderate cardiomegaly with left chest wall dual lead AICD in unchanged position. No focal airspace consolidation or pulmonary edema. No pleural effusion or pneumothorax. IMPRESSION: Cardiomegaly without pulmonary edema. Electronically Signed   By: Ulyses Jarred M.D.   On: 09/27/2018 01:16    ECG & Cardiac Imaging    Normal sinus rhythm with frequent PVCs.  2 morphologies of PVCs identified.  Assessment & Plan    ICD discharge.  Known chronic heart failure.  Heart failure reduced ejection fraction and LVEF of 20%.  Known elevated PVC burden.  On amiodarone.  Recently with aggressive diuresis today found  to have a potassium in the 2 range.  Likely the driver of an even higher PVC burden.  On telemetry PVC burden approaches 40%.  Plan: -Admit to cardiology.  Potassium supplementation.  Hold diuretics. -Start home medications.  Her blood pressures at baseline.  Signed, Cristina Gong, MD 09/27/2018, 1:52 AM  For questions or updates, please contact   Please consult [email or web address] for contact info under Cardiology/STEMI.

## 2018-09-27 NOTE — TOC Transition Note (Signed)
Transition of Care [facility]) - CM/SW Discharge Note   Patient Details  Name: [name] MRN: [id number] Date of Birth: Jun 27, 1970  Transition of Care [facility] CM/SW Contact:  Bethena Roys, RN Phone Number: 09/27/2018, 2:23 PM   Clinical Narrative: Pt presented for ICD shocks. PTA from home with spouse. No home needs identified. CM did speak with patient regarding not having a PCP- pt to contact [employer] to see if they can provide her with PCP in network. No further needs identified from CM at this time.        Final next level of care: Home/Self Care Barriers to Discharge: No Barriers Identified   Patient Goals and CMS Choice   CMS Medicare.gov Compare Post Acute Care list provided to:: (N/A) Choice offered to / list presented to : NA   Discharge Plan and Services In-house Referral: NA Discharge Planning Services: CM Consult Post Acute Care Choice: NA          DME Arranged: N/A DME Agency: NA HH Arranged: NA HH Agency: NA       Readmission Risk Interventions No flowsheet data found.

## 2018-09-27 NOTE — ED Notes (Signed)
ED TO INPATIENT HANDOFF REPORT  ED Nurse Name and Phone #: Deniece Rankin @ [phone]  S Name/Age/Gender [name] 48 y.o. female Room/Bed: 027C/027C  Code Status   Code Status: Full Code  Home/SNF/Other Home Patient oriented to: self, place, time and situation Is this baseline? Yes   Triage Complete: Triage complete  Chief Complaint AICD problem  Triage Note Pt reports her defibrillator went off around 1545 today and again tonight just after 11 (she had just fallen asleep). Pt says that she feels "weak and tired". Initially had some chest discomfort, but subsided now. No sob, cough, or fevers.    Allergies No Known Allergies  Level of Care/Admitting Diagnosis ED Disposition    ED Disposition Condition [facility] Area: [place] [[id number]]  Level of Care: Telemetry Cardiac [103]  Diagnosis: ICD (implantable cardioverter-defibrillator) discharge [967893]  Admitting Physician: Jolaine Artist [[id number]]  Attending Physician: Jolaine Artist [[id number]]  Estimated length of stay: 3 - 4 days  Certification:: I certify this patient will need inpatient services for at least 2 midnights  PT Class (Do Not Modify): Inpatient [101]  PT Acc Code (Do Not Modify): Private [1]       B Medical/Surgery History Past Medical History:  Diagnosis Date  . Automatic implantable cardioverter-defibrillator in situ   . Chronic CHF (congestive heart failure) (HCC)    a. EF 15-20% b. RHC (09/2013) RA 14, RV 57/22, PA 64/36 (48), PCWP 18, FIck CO/CI 3.7/1.6, PVR 8.1 WU, PA sat 47%   . History of stomach ulcers   . Hypertension   . Hypotension   . Hypothyroidism   . Morbid obesity ([place])   . Myocardial infarction ([place]) 08/2013  . Nocturnal dyspnea   . Nonischemic cardiomyopathy ([place])   . Sinus tachycardia   . Snoring-prob OSA 09/04/2011  . Sprint Fidelis ICD lead RECALL  [id number]    Past Surgical History:  Procedure Laterality Date  . BREATH TEK H PYLORI N/A 11/09/2014    Procedure: BREATH TEK H PYLORI;  Surgeon: Greer Pickerel, MD;  Location: [facility];  Service: General;  Laterality: N/A;  . CARDIAC CATHETERIZATION  ~ 2006; 09/2013  . CARDIAC CATHETERIZATION N/A 05/23/2016   Procedure: Right Heart Cath;  Surgeon: Larey Dresser, MD;  Location: [facility];  Service: Cardiovascular;  Laterality: N/A;  . CARDIAC DEFIBRILLATOR PLACEMENT  2006; 12/26/2013   Medtronic [id number]; 12-2013 ICD gen change and RV lead revision with new 8101 RV lead by Dr [name]  . CESAREAN SECTION  1999  . IMPLANTABLE CARDIOVERTER DEFIBRILLATOR GENERATOR CHANGE N/A 12/26/2013   Procedure: IMPLANTABLE CARDIOVERTER DEFIBRILLATOR GENERATOR CHANGE;  Surgeon: Deboraha Sprang, MD;  Location: [facility] CATH LAB;  Service: Cardiovascular;  Laterality: N/A;  . LEAD REVISION N/A 12/26/2013   Procedure: LEAD REVISION;  Surgeon: Deboraha Sprang, MD;  Location[facility] CATH LAB;  Service: Cardiovascular;  Laterality: N/A;  . RIGHT HEART CATHETERIZATION N/A 02/15/2014   Procedure: RIGHT HEART CATH;  Surgeon: Larey Dresser, MD;  Location: [facility] CATH LAB;  Service: Cardiovascular;  Laterality: N/A;  . TUBAL LIGATION  1999     A IV Location/Drains/Wounds Patient Lines/Drains/Airways Status   Active Line/Drains/Airways    Name:   Placement date:   Placement time:   Site:   Days:   Peripheral IV 09/27/18 Left Antecubital   09/27/18    0030    Antecubital   less than 1  Intake/Output Last 24 hours No intake or output data in the 24 hours ending 09/27/18 0205  Labs/Imaging Results for orders placed or performed during the hospital encounter of 09/27/18 (from the past 48 hour(s))  CBC with Differential/Platelet     Status: Abnormal   Collection Time: 09/27/18 12:24 AM  Result Value Ref Range   WBC 12.8 (H) 4.0 - 10.5 K/uL   RBC 5.44 (H) 3.87 - 5.11 MIL/uL   Hemoglobin 13.4 12.0 - 15.0 g/dL   HCT 44.5 36.0 - 46.0 %   MCV 81.8 80.0 - 100.0 fL   MCH 24.6 (L) 26.0 - 34.0 pg   MCHC 30.1 30.0 -  36.0 g/dL   RDW 14.6 11.5 - 15.5 %   Platelets 343 150 - 400 K/uL   nRBC 0.0 0.0 - 0.2 %   Neutrophils Relative % 61 %   Neutro Abs 7.8 (H) 1.7 - 7.7 K/uL   Lymphocytes Relative 26 %   Lymphs Abs 3.3 0.7 - 4.0 K/uL   Monocytes Relative 11 %   Monocytes Absolute 1.4 (H) 0.1 - 1.0 K/uL   Eosinophils Relative 1 %   Eosinophils Absolute 0.1 0.0 - 0.5 K/uL   Basophils Relative 1 %   Basophils Absolute 0.1 0.0 - 0.1 K/uL   Immature Granulocytes 0 %   Abs Immature Granulocytes 0.05 0.00 - 0.07 K/uL    Comment: Performed at [facility], [address], [place], [place] [address]  Comprehensive metabolic panel     Status: Abnormal   Collection Time: 09/27/18 12:24 AM  Result Value Ref Range   Sodium 130 (L) 135 - 145 mmol/L   Potassium 2.5 (LL) 3.5 - 5.1 mmol/L    Comment: CRITICAL RESULT CALLED TO, READ BACK BY AND VERIFIED WITH: Jilliam Bellmore,K RN 09/27/2018 0115 JORDANS    Chloride 88 (L) 98 - 111 mmol/L   CO2 29 22 - 32 mmol/L   Glucose, Bld 117 (H) 70 - 99 mg/dL   BUN 28 (H) 6 - 20 mg/dL   Creatinine, Ser 1.43 (H) 0.44 - 1.00 mg/dL   Calcium 9.4 8.9 - 10.3 mg/dL   Total Protein 8.2 (H) 6.5 - 8.1 g/dL   Albumin 4.0 3.5 - 5.0 g/dL   AST 19 15 - 41 U/L   ALT 19 0 - 44 U/L   Alkaline Phosphatase 70 38 - 126 U/L   Total Bilirubin 0.8 0.3 - 1.2 mg/dL   GFR calc non Af Amer 44 (L) >60 mL/min   GFR calc Af Amer 50 (L) >60 mL/min   Anion gap 13 5 - 15    Comment: Performed at [facility], [place] [address], [place], [place] [address]  Magnesium     Status: None   Collection Time: 09/27/18 12:24 AM  Result Value Ref Range   Magnesium 2.2 1.7 - 2.4 mg/dL    Comment: Performed at [facility], [place] [address], [place], [place] [address]  Digoxin level     Status: Abnormal   Collection Time: 09/27/18 12:24 AM  Result Value Ref Range   Digoxin Level 0.3 (L) 0.8 - 2.0 ng/mL    Comment: Performed at [facility] [address] [place], [place] [address]  Protime-INR      Status: None   Collection Time: 09/27/18 12:24 AM  Result Value Ref Range   Prothrombin Time 14.4 11.4 - 15.2 seconds   INR 1.1 0.8 - 1.2    Comment: (NOTE) INR  goal varies based on device and disease states. Performed at [facility], [place] [address] [place], [place] [address]   Brain natriuretic peptide     Status: Abnormal   Collection Time: 09/27/18 12:24 AM  Result Value Ref Range   B Natriuretic Peptide 404.7 (H) 0.0 - 100.0 pg/mL    Comment: Performed at [facility] [address] [place], [place] [address]  Troponin I - Once-Timed     Status: Abnormal   Collection Time: 09/27/18 12:24 AM  Result Value Ref Range   Troponin I 0.05 (HH) <0.03 ng/mL    Comment: CRITICAL RESULT CALLED TO, READ BACK BY AND VERIFIED WITH: Monserat Prestigiacomo,K RN 09/27/2018 0152 JORDANS Performed at [facility], [place] [address], [place], [place] [address]    Dg Chest Portable 1 View  Result Date: 09/27/2018 CLINICAL DATA:  Chest pain EXAM: PORTABLE CHEST 1 VIEW COMPARISON:  11/09/2014 FINDINGS: Moderate cardiomegaly with left chest wall dual lead AICD in unchanged position. No focal airspace consolidation or pulmonary edema. No pleural effusion or pneumothorax. IMPRESSION: Cardiomegaly without pulmonary edema. Electronically Signed   By: Ulyses Jarred M.D.   On: 09/27/2018 01:16    Pending Labs Unresulted Labs (From admission, onward)    Start     Ordered   09/28/18 4854  Basic metabolic panel  Daily,   R     09/27/18 0148   09/27/18 0148  TSH  Once,   R     09/27/18 0148   09/27/18 0146  HIV antibody (Routine Testing)  Once,   R     09/27/18 0148          Vitals/Pain Today's Vitals   09/27/18 0030 09/27/18 0045 09/27/18 0115 09/27/18 0145  BP: 100/74 92/61 101/62 (!) 88/55  Pulse: 82 79 (!) 38 (!) 39  Resp: 16 17 17 15   Temp:      TempSrc:      SpO2: 99% 99% 98% 97%  Weight:      Height:      PainSc:        Isolation Precautions No active  isolations  Medications Medications  potassium chloride 10 mEq in 100 mL IVPB (10 mEq Intravenous New Bag/Given 09/27/18 0204)  amiodarone (PACERONE) tablet 200 mg (has no administration in time range)  atorvastatin (LIPITOR) tablet 40 mg (has no administration in time range)  carvedilol (COREG) tablet 25 mg (has no administration in time range)  digoxin (LANOXIN) tablet 125 mcg (has no administration in time range)  sacubitril-valsartan (ENTRESTO) 24-26 mg per tablet (has no administration in time range)  spironolactone (ALDACTONE) tablet 25 mg (has no administration in time range)  levothyroxine (SYNTHROID, LEVOTHROID) tablet 25 mcg (has no administration in time range)  sodium chloride flush (NS) 0.9 % injection 3 mL (has no administration in time range)  sodium chloride flush (NS) 0.9 % injection 3 mL (has no administration in time range)  0.9 %  sodium chloride infusion (has no administration in time range)  acetaminophen (TYLENOL) tablet 650 mg (has no administration in time range)  ondansetron (ZOFRAN) injection 4 mg (has no administration in time range)  potassium chloride SA (K-DUR,KLOR-CON) CR tablet 40 mEq (40 mEq Oral Given 09/27/18 0204)    Mobility walks Low fall risk   Focused Assessments Cardiac Assessment Handoff:    Lab Results  Component Value Date   CKMB 17.9 (H) 09/19/2013   TROPONINI 0.05 (HH) 09/27/2018   No results found for: DDIMER Does  the Patient currently have chest pain? No - only palpitations. Frequent PVC's on monitor    R Recommendations: See Admitting Provider Note  Report given to:   Additional Notes:  PT BP NORMALLY RUNS SBP 80-90'S. MD IS AWARE

## 2018-09-28 ENCOUNTER — Telehealth (HOSPITAL_COMMUNITY): Payer: Self-pay

## 2018-09-28 DIAGNOSIS — I5022 Chronic systolic (congestive) heart failure: Secondary | ICD-10-CM

## 2018-09-28 LAB — BASIC METABOLIC PANEL
Anion gap: 10 (ref 5–15)
BUN: 22 mg/dL — ABNORMAL HIGH (ref 6–20)
CO2: 28 mmol/L (ref 22–32)
Calcium: 9.3 mg/dL (ref 8.9–10.3)
Chloride: 98 mmol/L (ref 98–111)
Creatinine, Ser: 1.04 mg/dL — ABNORMAL HIGH (ref 0.44–1.00)
GFR calc Af Amer: >60 mL/min (ref >60)
GFR calc non Af Amer: >60 mL/min (ref >60)
Glucose, Bld: 99 mg/dL (ref 70–99)
Potassium: 3.1 mmol/L — ABNORMAL LOW (ref 3.5–5.1)
Sodium: 136 mmol/L (ref 135–145)

## 2018-09-28 LAB — GLUCOSE, CAPILLARY
Glucose-Capillary: 99 mg/dL (ref 70–99)
Glucose-Capillary: 85 mg/dL (ref 70–99)
Glucose-Capillary: 92 mg/dL (ref 70–99)

## 2018-09-28 MED ORDER — TORSEMIDE 20 MG PO TABS: 80.0000 mg | ORAL_TABLET | Freq: Every day | ORAL | 5 refills | Status: DC

## 2018-09-28 MED ORDER — POTASSIUM CHLORIDE CRYS ER 20 MEQ PO TBCR
60.0000 meq | EXTENDED_RELEASE_TABLET | Freq: Once | ORAL | Status: AC
  Administered 2018-09-28: 60 meq via ORAL
  Filled 2018-09-28: qty 3

## 2018-09-28 MED ORDER — SPIRONOLACTONE 25 MG PO TABS
25.0000 mg | ORAL_TABLET | Freq: Every day | ORAL | Status: DC
  Administered 2018-09-28: 25 mg via ORAL
  Filled 2018-09-28: qty 1

## 2018-09-28 MED ORDER — POTASSIUM CHLORIDE CRYS ER 20 MEQ PO TBCR: 60.0000 meq | EXTENDED_RELEASE_TABLET | Freq: Two times a day (BID) | ORAL | 5 refills | Status: DC

## 2018-09-28 MED ORDER — SPIRONOLACTONE 25 MG PO TABS: 25.0000 mg | ORAL_TABLET | Freq: Every day | ORAL | Status: DC

## 2018-09-28 MED ORDER — POTASSIUM CHLORIDE CRYS ER 20 MEQ PO TBCR: 40.0000 meq | EXTENDED_RELEASE_TABLET | Freq: Once | ORAL | Status: DC

## 2018-09-28 MED ORDER — TORSEMIDE 20 MG PO TABS
60.0000 mg | ORAL_TABLET | Freq: Once | ORAL | Status: AC
  Administered 2018-09-28: 60 mg via ORAL
  Filled 2018-09-28: qty 3

## 2018-09-28 NOTE — Progress Notes (Signed)
Potassium 3.1- paged Cardiology on call [facility])

## 2018-09-28 NOTE — Discharge Summary (Signed)
Advanced Heart Failure Discharge Note  Discharge Summary   Patient ID: [name] MRN: [id number], DOB/AGE: Jan 23, 1971 48 y.o. Admit date: 09/27/2018 D/C date:     09/28/2018    Primary Discharge Diagnoses:  1. 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 Chronic systolic HF: NICM 3. Pulmonary HTN 4. Morbid obesity 5. Hyperlipidemia  Hospital Course:  [name] is a 49 y.o. female with morbid obesity, HTN, frequent PVCs on amiodarone, and chronic systolic HF due to nonischemic cardiomyopathy s/p Medtronic ICD (2006).   She presented to [facility] on 09/26/18 after ICD discharge x 2. EP consulted. She had VF in setting of hypokalemia with K 2.5. She had recently transitioned from lasix to torsemide and had marked diuresis at home. Noted to have frequent PVCs, but per Dr [name] this is likely a symptom of worsening HF rather than a cause. PVCs improved with potassium supplementation. Torsemide was decreased and potassium supplementation increased for home. She remained on home amiodarone. See problem based review of admission below.   1. Frequent PVCs/VT: Now s/p ICD discharge x 2 at home.  Noted to have >10% PVCs recently on Zio patch and NSVT.  Seen by Dr. [name], thought most likely electromechanical related to worsening of CHF rather than causative of her cardiomyopathy. Diuretics were recently adjusted and she lost 14 lbs in less than a week. She had VT x 2 just prior to admission. K was 2.5 at admission.  Suspect that cause of VT was hypokalemia related to augmented diuresis. No further NSVT/VT.   - Increase potassium to 60 meq BID for home. She will need a BMET on Thursday this week.  - Continue home amiodarone 200 mg daily 2. Chronic systolic HF: Nonischemic cardiomyopathy. She has a Medtronic ICD. Cause for cardiomyopathy is uncertain: initially noted peri-partum (after son's birth), so cannot rule out peri-partum CMP. On and off, she has had frequent PVCs. RHC in 8/15  showed normal filling pressures and preserved cardiac output. She does not qualify for CRT with narrow QRS. CPX 6/19 with low normal functional capacity. Last echo in 10/19 showed EF 20-25% with moderate LV dilation and moderate MR.  - Torsemide resumed at lower dose 80 mg daily (had been taking 80 qam/40 qpm).  KCl increased to 60 bid for home (was on 40/20).  - Continue Entresto 24/26 mg BID, no BP room to increase.  - Continue Coreg 25 mg bid and ivabradine 5 mg bid.  - Continue current spironolactone and digoxin, digoxin level ok.  - Will need eventual CPX with concern for worsening of HF recently.  3. Pulmonary HTN: Mixed picture with PVR 8.1 on RHC at [facility]. However, repeat study in 8/15 here showed no significant pulmonary hypertension.  4. Morbid obesity:She has been working on weight loss.  6. Hyperlipidemia: Good lipids in 9/19.   She will be followed closely in the HF clinic, with telehealth visit as below. She will have a BMET checked on Thursday if the HF clinic.   Discharge Weight Range: 238 lbs Discharge Vitals: Blood pressure 92/64, pulse 68, temperature (!) 97.5 F (36.4 C), temperature source Oral, resp. rate 17, height 5\' 3"  (1.6 m), weight 107.9 kg, SpO2 98 %.  Labs: Lab Results  Component Value Date   WBC 12.8 (H) 09/27/2018   HGB 13.4 09/27/2018   HCT 44.5 09/27/2018   MCV 81.8 09/27/2018   PLT 343 09/27/2018    Recent Labs  Lab 09/27/18 0024  09/28/18  0408  NA 130*  --  136  K 2.5*   < > 3.1*  CL 88*  --  98  CO2 29  --  28  BUN 28*  --  22*  CREATININE 1.43*  --  1.04*  CALCIUM 9.4  --  9.3  PROT 8.2*  --   --   BILITOT 0.8  --   --   ALKPHOS 70  --   --   ALT 19  --   --   AST 19  --   --   GLUCOSE 117*  --  99   < > = values in this interval not displayed.   Lab Results  Component Value Date   CHOL 171 08/17/2018   HDL 46 08/17/2018   LDLCALC 103 (H) 08/17/2018   TRIG 111 08/17/2018   BNP (last 3 results) Recent Labs    09/27/18  0024  BNP 404.7*    ProBNP (last 3 results) No results for input(s): PROBNP in the last 8760 hours.   Diagnostic Studies/Procedures   Dg Chest Portable 1 View  Result Date: 09/27/2018 CLINICAL DATA:  Chest pain EXAM: PORTABLE CHEST 1 VIEW COMPARISON:  11/09/2014 FINDINGS: Moderate cardiomegaly with left chest wall dual lead AICD in unchanged position. No focal airspace consolidation or pulmonary edema. No pleural effusion or pneumothorax. IMPRESSION: Cardiomegaly without pulmonary edema. Electronically Signed   By: Ulyses Jarred M.D.   On: 09/27/2018 01:16    Discharge Medications   Allergies as of 09/28/2018   No Known Allergies     Medication List    TAKE these medications   amiodarone 200 MG tablet Commonly known as:  PACERONE Take 200 mg by mouth daily. What changed:  Another medication with the same name was removed. Continue taking this medication, and follow the directions you see here.   atorvastatin 40 MG tablet Commonly known as:  LIPITOR TAKE 1 TABLET BY MOUTH ONCE DAILY What changed:  when to take this   carvedilol 25 MG tablet Commonly known as:  COREG TAKE 1 TABLET BY MOUTH TWICE A DAY WITH A MEAL What changed:  See the new instructions.   digoxin 0.125 MG tablet Commonly known as:  LANOXIN TAKE 1 TABLET BY MOUTH ONCE DAILY   Entresto 24-26 MG Generic drug:  sacubitril-valsartan TAKE 1 TABLET BY MOUTH TWICE A DAY   ivabradine 5 MG Tabs tablet Commonly known as:  Corlanor Take 1 tablet (5 mg total) by mouth 2 (two) times daily with a meal.   levothyroxine 25 MCG tablet Commonly known as:  Synthroid Take 1 tablet (25 mcg total) by mouth daily before breakfast.   metFORMIN 500 MG tablet Commonly known as:  GLUCOPHAGE Take 1 tablet (500 mg total) by mouth daily with breakfast.   metolazone 2.5 MG tablet Commonly known as:  ZAROXOLYN Take 1 tablet (2.5 mg total) by mouth daily. What changed:    when to take this  reasons to take this    potassium chloride SA 20 MEQ tablet Commonly known as:  K-DUR,KLOR-CON Take 3 tablets (60 mEq total) by mouth 2 (two) times daily. What changed:    how much to take  how to take this  when to take this  additional instructions   spironolactone 25 MG tablet Commonly known as:  ALDACTONE TAKE 1 TABLET BY MOUTH ONCE A DAY   torsemide 20 MG tablet Commonly known as:  DEMADEX Take 4 tablets (80 mg total) by mouth daily. Start taking on:  September 29, 2018 What changed:  when to take this   Vitamin D (Ergocalciferol) 1.25 MG (50000 UT) Caps capsule Commonly known as:  DRISDOL Take 1 capsule (50,000 Units total) by mouth every 7 (seven) days. What changed:  when to take this       Disposition   The patient will be discharged in stable condition to home. Discharge Instructions    (HEART FAILURE PATIENTS) Call MD:  Anytime you have any of the following symptoms: 1) 3 pound weight gain in 24 hours or 5 pounds in 1 week 2) shortness of breath, with or without a dry hacking cough 3) swelling in the hands, feet or stomach 4) if you have to sleep on extra pillows at night in order to breathe.   Complete by:  As directed    Call MD for:  persistant dizziness or light-headedness   Complete by:  As directed    Diet - low sodium heart healthy   Complete by:  As directed    Heart Failure patients record your daily weight using the same scale at the same time of day   Complete by:  As directed    Increase activity slowly   Complete by:  As directed      Follow-up Information    Larey Dresser, MD Follow up on 10/05/2018.   Specialty:  Cardiology Why:  11 am. Dr [name] will call you for video visit. You will receive a text with a link to start a secure video conference.  Contact information: [place] [id number] [place] Follow up on 09/30/2018.   Specialty:  Cardiology Why:  Lab appointment only.  Recheck BMET. Code for garage is: [place] information: [address] [id number] [place] [phone]            Duration of Discharge Encounter: Greater than 35 minutes   Signed, Georgiana Shore, NP 09/28/2018, 11:00 AM

## 2018-09-28 NOTE — Discharge Instructions (Signed)

## 2018-09-28 NOTE — Telephone Encounter (Signed)
Appointment made from Thursday, April 9 9:45a. Spoke with patient aware of appointment time.

## 2018-09-28 NOTE — Progress Notes (Signed)
Reviewed discharge instructions with patient by telephone. She has no further questions. Medications sent to [facility]. She will get a BMET on Thursday in HF clinic. Telehealth follow up has been scheduled. Appointments placed on AVS.  Georgiana Shore, NP

## 2018-09-28 NOTE — Telephone Encounter (Signed)
-----   Message from Georgiana Shore, NP sent at 09/28/2018 10:48 AM EDT ----- Regarding: Change lab appointment Please change lab appointment from Wednesday to Thursday for BMET per Dr Claris Gladden request.

## 2018-09-28 NOTE — Progress Notes (Signed)
Patient ID: [name], female   DOB: 01/05/1971, 48 y.o.   MRN: [id number]      Advanced Heart Failure Rounding Note  PCP-Cardiologist: [name], MD   Subjective:    Patient was admitted with VT x 2 with ICD discharges both times in the setting of profound hypokalemia.  K up to 3.1 this morning.  No further VT, has had some PVCs.  Feels like she is gaining fluid, weight up 3 lbs. No diuretic yesterday.   Objective:   Weight Range: 107.9 kg Body mass index is 42.14 kg/m.   Vital Signs:   Temp:  [97.5 F (36.4 C)-97.8 F (36.6 C)] 97.5 F (36.4 C) (04/07 0459) Pulse Rate:  [65-68] 68 (04/07 0459) Resp:  [17] 17 (04/07 0449) BP: (87-92)/(55-64) 92/64 (04/07 0459) SpO2:  [98 %-100 %] 98 % (04/07 0459) Weight:  [107.9 kg] 107.9 kg (04/07 0459) Last BM Date: 09/26/18  Weight change: Filed Weights   09/27/18 0015 09/27/18 0500 09/28/18 0459  Weight: 105.7 kg 106.4 kg 107.9 kg    Intake/Output:   Intake/Output Summary (Last 24 hours) at 09/28/2018 1035 Last data filed at 09/28/2018 0502 Gross per 24 hour  Intake 628.59 ml  Output -  Net 628.59 ml      Physical Exam    General: NAD Neck: JVP 8 cm, no thyromegaly or thyroid nodule.  Lungs: Clear to auscultation bilaterally with normal respiratory effort. CV: Nondisplaced PMI.  Heart regular S1/S2, no S3/S4, no murmur.  No peripheral edema.   Abdomen: Soft, nontender, no hepatosplenomegaly, no distention.  Skin: Intact without lesions or rashes.  Neurologic: Alert and oriented x 3.  Psych: Normal affect. Extremities: No clubbing or cyanosis.  HEENT: Normal.    Telemetry   NSR in 70s, occasional PVCs, no VT (personally reviewed).   Labs    CBC Recent Labs    09/27/18 0024  WBC 12.8*  NEUTROABS 7.8*  HGB 13.4  HCT 44.5  MCV 81.8  PLT 097   Basic Metabolic Panel Recent Labs    09/27/18 0024  09/27/18 1518 09/28/18 0408  NA 130*  --   --  136  K 2.5*   < > 3.8 3.1*  CL 88*  --   --  98  CO2 29   --   --  28  GLUCOSE 117*  --   --  99  BUN 28*  --   --  22*  CREATININE 1.43*  --   --  1.04*  CALCIUM 9.4  --   --  9.3  MG 2.2  --   --   --    < > = values in this interval not displayed.   Liver Function Tests Recent Labs    09/27/18 0024  AST 19  ALT 19  ALKPHOS 70  BILITOT 0.8  PROT 8.2*  ALBUMIN 4.0   No results for input(s): LIPASE, AMYLASE in the last 72 hours. Cardiac Enzymes Recent Labs    09/27/18 0024  TROPONINI 0.05*    BNP: BNP (last 3 results) Recent Labs    09/27/18 0024  BNP 404.7*    ProBNP (last 3 results) No results for input(s): PROBNP in the last 8760 hours.   D-Dimer No results for input(s): DDIMER in the last 72 hours. Hemoglobin A1C No results for input(s): HGBA1C in the last 72 hours. Fasting Lipid Panel No results for input(s): CHOL, HDL, LDLCALC, TRIG, CHOLHDL, LDLDIRECT in the last 72 hours. Thyroid Function Tests Recent  Labs    09/27/18 0159  TSH 8.532*    Other results:   Imaging    No results found.   Medications:     Scheduled Medications: . amiodarone  200 mg Oral Daily  . atorvastatin  40 mg Oral Daily  . carvedilol  25 mg Oral BID WC  . digoxin  125 mcg Oral Daily  . ivabradine  5 mg Oral BID WC  . levothyroxine  25 mcg Oral Q0600  . potassium chloride  40 mEq Oral Once  . potassium chloride  60 mEq Oral Once  . sacubitril-valsartan  1 tablet Oral BID  . sodium chloride flush  3 mL Intravenous Q12H  . spironolactone  25 mg Oral Daily  . torsemide  60 mg Oral Once    Infusions: . sodium chloride      PRN Medications: sodium chloride, acetaminophen, ondansetron (ZOFRAN) IV, sodium chloride flush    Assessment/Plan   1. Frequent PVCs/VT: Now s/p ICD discharge x 2 at home.  Noted to have >10% PVCs recently on Zio patch and NSVT.  Seen by Dr. [name] last week, thought most likely electromechanical related to worsening of CHF rather than causative of her cardiomyopathy.  I adjusted her diuretics  and she lost 14 lbs in less than a week. She had VT x 2 just prior to admission. K was 2.5 at admission.  I suspect that cause of VT was hypokalemia related to augmented diuresis. No further NSVT/VT.  K is 3.1 today. - She needs KCl 60 mEq x 1 now.  - Continue home amiodarone.  2. Chronic systolic HF: Nonischemic cardiomyopathy.  She has a Medtronic ICD.  Cause for cardiomyopathy is uncertain: initially noted peri-partum (after son's birth), so cannot rule out peri-partum CMP.  On and off, she has had frequent PVCs.   RHC in 8/15 showed normal filling pressures and preserved cardiac output.  She does not qualify for CRT with narrow QRS.  CPX 6/19 with low normal functional capacity.  Last echo in 10/19 showed EF 20-25% with moderate LV dilation and moderate MR. Symptomatically worse recently, NYHA class III and diuretics transitioned to torsemide with 1 dose of metolazone.  She lost weight with this regimen but became hypokalemic. Baseline SBP in 90s-100s. Diuretics held yesterday, now feels like she is gaining fluid.  - Restart torsemide 60 mg today, then up to 80 mg daily tomorrow (had been taking 80 qam/40 qpm).  Increase KCl to 60 bid for home (was on 40/20).  - Continue Entresto 24/26 mg BID, no BP room to increase.    - Continue Coreg 25 mg bid and ivabradine 5 mg bid.   - Continue current spironolactone and digoxin, digoxin level ok.  - Will need eventual CPX with concern for worsening of HF recently.  3. Pulmonary HTN: Mixed picture with PVR 8.1 on RHC at [facility].  However, repeat study in 8/15 here showed no significant pulmonary hypertension.  4. Morbid obesity: She has been working on weight loss.   6. Hyperlipidemia: Good lipids in 9/19.   7. Disposition: She can go home today.  BMET on Thursday.  Telehealth followup 10 days.  Meds for discharge: KCl 60 bid, torsemide 80 daily starting tomorrow, Entresto 24/26 bid, ivabradine 5 bid, amiodarone 200 daily, Coreg 25 bid, spironolactone 25 daily,  digoxin 0.125 daily, atorvastatin 40 daily.   Length of Stay: 1  [name], MD  09/28/2018, 10:35 AM  Advanced Heart Failure Team Pager [phone] (M-F; 7a - 4p)  Please contact [facility] for night-coverage after hours (4p -7a ) and weekends on [email or web address]

## 2018-09-29 ENCOUNTER — Other Ambulatory Visit (HOSPITAL_COMMUNITY): Payer: BLUE CROSS/BLUE SHIELD

## 2018-09-30 ENCOUNTER — Other Ambulatory Visit: Payer: Self-pay

## 2018-09-30 ENCOUNTER — Ambulatory Visit (HOSPITAL_COMMUNITY)
Admit: 2018-09-30 | Discharge: 2018-09-30 | Disposition: A | Discharge status: Home / Self Care | Payer: BLUE CROSS/BLUE SHIELD | Source: Ambulatory Visit | Attending: Internal Medicine | Admitting: Internal Medicine

## 2018-09-30 DIAGNOSIS — I5022 Chronic systolic (congestive) heart failure: Secondary | ICD-10-CM

## 2018-09-30 LAB — BASIC METABOLIC PANEL
Anion gap: 12 (ref 5–15)
BUN: 20 mg/dL (ref 6–20)
CO2: 27 mmol/L (ref 22–32)
Calcium: 9.4 mg/dL (ref 8.9–10.3)
Chloride: 100 mmol/L (ref 98–111)
Creatinine, Ser: 1.23 mg/dL — ABNORMAL HIGH (ref 0.44–1.00)
GFR calc Af Amer: >60 mL/min (ref >60)
GFR calc non Af Amer: 52 mL/min — ABNORMAL LOW (ref >60)
Glucose, Bld: 95 mg/dL (ref 70–99)
Potassium: 3.9 mmol/L (ref 3.5–5.1)
Sodium: 139 mmol/L (ref 135–145)

## 2018-10-04 ENCOUNTER — Other Ambulatory Visit: Payer: Self-pay

## 2018-10-04 ENCOUNTER — Ambulatory Visit (INDEPENDENT_AMBULATORY_CARE_PROVIDER_SITE_OTHER): Payer: BLUE CROSS/BLUE SHIELD

## 2018-10-04 DIAGNOSIS — I5022 Chronic systolic (congestive) heart failure: Secondary | ICD-10-CM | POA: Diagnosis not present

## 2018-10-04 DIAGNOSIS — Z95810 Presence of automatic (implantable) cardiac defibrillator: Secondary | ICD-10-CM | POA: Diagnosis not present

## 2018-10-05 ENCOUNTER — Ambulatory Visit (INDEPENDENT_AMBULATORY_CARE_PROVIDER_SITE_OTHER): Payer: BLUE CROSS/BLUE SHIELD | Admitting: *Deleted

## 2018-10-05 ENCOUNTER — Telehealth (HOSPITAL_COMMUNITY): Payer: Self-pay

## 2018-10-05 ENCOUNTER — Other Ambulatory Visit: Payer: Self-pay

## 2018-10-05 ENCOUNTER — Ambulatory Visit (HOSPITAL_COMMUNITY)
Admission: RE | Admit: 2018-10-05 | Discharge: 2018-10-05 | Disposition: A | Discharge status: Home / Self Care | Payer: BLUE CROSS/BLUE SHIELD | Source: Ambulatory Visit | Attending: Cardiology | Admitting: Cardiology

## 2018-10-05 DIAGNOSIS — I428 Other cardiomyopathies: Secondary | ICD-10-CM | POA: Diagnosis not present

## 2018-10-05 DIAGNOSIS — I5022 Chronic systolic (congestive) heart failure: Secondary | ICD-10-CM

## 2018-10-05 LAB — CUP PACEART REMOTE DEVICE CHECK
Battery Remaining Longevity: 57 mo
Battery Voltage: 2.97 V
Brady Statistic RV Percent Paced: 0 %
Date Time Interrogation Session: 20200414111818
HighPow Impedance: 75 Ohm
Implantable Lead Implant Date: 20150706
Implantable Lead Location: 753860
Implantable Pulse Generator Implant Date: 20150706
Lead Channel Impedance Value: 494 Ohm
Lead Channel Impedance Value: 513 Ohm
Lead Channel Pacing Threshold Amplitude: 0.5 V
Lead Channel Pacing Threshold Pulse Width: 0.4 ms
Lead Channel Sensing Intrinsic Amplitude: 8.75 mV
Lead Channel Sensing Intrinsic Amplitude: 8.75 mV
Lead Channel Setting Pacing Amplitude: 2 V
Lead Channel Setting Pacing Pulse Width: 0.4 ms
Lead Channel Setting Sensing Sensitivity: 0.3 mV

## 2018-10-05 MED ORDER — SPIRONOLACTONE 25 MG PO TABS: 25.0000 mg | ORAL_TABLET | Freq: Every day | ORAL | 6 refills | Status: DC

## 2018-10-05 NOTE — Telephone Encounter (Signed)
Reviewed AVS from telephone visit:  1. Needs CPX when we start doing them again, forwarded to Kris/Dawne 2. Followup in 6 wks, office versus telehealth (23 June @9am ) 3. Take spironolactone at bedtime rather than in am, aware 4. If she gains 3 lbs overnight or 5 lbs in a week, take torsemide 80 qam/40 qpm x 2 days then back to 80 mg daily. If she takes the extra torsemide, needs to take 40 mEq extra KCl, aware 5. Arrange to draw BMET and Mg (21 April @9am )  Pt had no further questions

## 2018-10-05 NOTE — Progress Notes (Signed)
EPIC Encounter for ICM Monitoring  Patient Name: [name] is a 48 y.o. female Date: 10/05/2018 Primary Care Physican: [name], MD Primary Cardiologist: [name] Electrophysiologist: [name] Weight: 238 lbs (4/7 discharge weight)       Transmission reviewed.  ER visit 09/28/2018 for device shock possibly attributed to hypokalemia   Report: Thoracic impedance returned to normal after initial referral remote.   Prescribed: Torsemide 20 mg take 4 tablets (80 mg total) daily. Potassium 20 mEq take 3 tablets (60 mEq total) twice a day.  Spironolactone 25 mg take 1 tablet daily.  Labs: 09/30/2018 Creatinine 1.23, BUN 20, Potassium 3.9, Sodium 139, GFR 52->60 09/28/2018 Creatinine 1.04, BUN 22, Potassium 3.1, Sodium 136, GFR >60  09/27/2018 Potassium 3.8 (3:18 PM)  09/27/2018 Creatinine 1.43, BUN 28, Potassium 2.5, Sodium 130, GFR 44-50 (12:24 AM)  09/22/2018 Creatinine 1.37, BUN 17, Potassium 4.0, Sodium 139, GFR 46-53  08/24/2018 Creatinine 1.05, BUN 13, Potassium 3.9, Sodium 139, GFR >60  08/10/2018 Creatinine 0.98, BUN 8,   Potassium 3.0, Sodium 138, GFR >60  A complete set of results can be found in Results Review.  Recommendations: Patient has virtual office visit 10/05/2018.  Follow-up plan: ICM clinic phone appointment on 11/08/2018.   Virtual Office appt 10/05/2018 with Dr. [name].    Copy of ICM check sent to Dr. [name] and Dr [name].   3 month ICM trend: 10/05/2018    1 Year ICM trend:       [name], RN 10/05/2018 9:46 AM

## 2018-10-05 NOTE — Patient Instructions (Addendum)
    1. Needs CPX when we start doing them again, forwarded to Kris/Dawne 2. Followup in 6 wks, office versus telehealth (23 June @9am ) 3. Take spironolactone at bedtime rather than in am, aware 4. If she gains 3 lbs overnight or 5 lbs in a week, take torsemide 80 qam/40 qpm x 2 days then back to 80 mg daily. If she takes the extra torsemide, needs to take 40 mEq extra KCl, aware 5. Arrange to draw BMET and Mg (21 April @9am )

## 2018-10-05 NOTE — Progress Notes (Signed)
Heart Failure TeleHealth Note  Due to national recommendations of social distancing due to [place] 19, Audio/video telehealth visit is felt to be most appropriate for this patient at this time.  See MyChart message from today for patient consent regarding telehealth for [name].  Date:  10/05/2018   ID:  [name], DOB 1971-03-19, MRN [id number]  Location: Home  Provider location: [place] Advanced Heart Failure Type of Visit: Established patient   PCP:  [name], MD  Cardiologist:  [name], MD  Chief Complaint: Exertional dyspnea.   History of Present Illness: [name] is a 48 y.o. female who presents via audio/video conferencing for a telehealth visit today.     she denies symptoms worrisome for COVID 19.   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  She was followed for systolic CHF initially in [place]. Apparently her EF recovered to 35-40%. However, she was admitted to [facility] 09/19/13 for worsening dyspnea and CP. Coronaries were reportedly OK on LHC at that time at [facility]. Echo showed EF of 15-20% with four-chamber enlargement with moderate-severe TR/MR. RHC as below. Rheumatological w/u negative.  CPX in 9/16 showed near-normal functional capacity.  Repeat echo in 6/17 shows that EF remains 20%. Repeat CPX in 1/18 was submaximal but probably only mild circulatory limitation.  Echo 8/18 showed EF 25-30%, severe LV dilation.  CPX in 6/19 showed low normal functional capacity.  Echo in 10/19 showed EF 20-25%, moderate LV dilation.   Zio patch in 2/20 with 11% PVCs and NSVT, amiodarone started. Zio patch in 3/20 with 13.3% PVCs and NSVT.  She saw Dr. [name], he thought that increased PVCs were electromechanical in nature due to worsening heart failure.   She was volume overloaded and symptomatic at last visit, Lasix stopped and torsemide started.  She  was admitted overnight in 4/20 with VT in the setting of hypokalemia.  Weight is down significantly after switch to torsemide.  SBP continues to run low chronically, no significant lightheadedness.  She has occasional palpitations, felt run of palpitations last night.  No dyspnea walking up a flight of stairs or on flat ground.  She can do light housework without problems.  She has chronic orthopnea but is back in bed instead of sleeping in her recliner.     Medtronic device interrogation: thoracic impedance back to baseline, fluid index < threshold.   RHC Procedural Findings: RA mean 5 RV 55/8 PA 56/31, mean 40 PCWP mean 24 Oxygen saturations: PA 61% AO 99% Cardiac Output (Fick) 4.3  Cardiac Index (Fick) 2.0 PVR 3.7 WU  RHC 09/24/13  RA 14  RV 57/22  PA 64/36 (48)  PCWP 18  Fick CO/CI: 3.7/1.6  PVR 8.1 WU  Ao sat 95%  PA sat 47%   CPX 06/27/2016  Peak VO2: 13.5 (56.2% predicted peak VO2) VE/VCO2 slope: 33 OUES: 2.16 Peak RER: 0.98  CPX  1/18 Peak VO2 13.5 VE/VCO2 slope 33 RER 0.98 Submaximal study, mild circulatory limitation, suspect more limited by body habitus.   Echo (8/15) with EF 20%, moderate LV dilation, diffuse hypokinesis, normal RV size with mildly decreased systolic function, mild MR.  Echo (5/16) with EF 20%, spherical LV with diffuse hypokinesis, mild MR.  Echo (6/17) with EF 20%, diffuse hypokinesis, moderate LV dilation, normal RV, mild to moderate MR.  Echo (8/18) with EF 25-30%, severe LV dilation, severe LAE.  Echo (2/20) with EF 20-25%, moderate LV dilation, moderate MR.   Sleep study did not show significant OSA.   RHC (8/15): Mean RA 3 PA 33/11 mean 19 Mean PCWP 8 CI 2.35  Holter (8/15) with < 1% PVCs  CPX 9/16 Peak VO2 16.2 VE/VCO2 slope 23.6 RER 1.25 Normal functional capacity  CPX (6/19): peak VO2 14.3 (83% predicted), VE/VCO2 slope 30 => low normal functional capacity.   Labs: (11/09/13): Dig level 0.5, K 3.6,  Creatinine 0.83, pro-BNP 622 (12/19/13): K 3.6, creatinine 0.8 (7/15): K 4.1, creatinine 0.91 (8/15): K 3.9, creatinine 0.91 (10/15): K 4, creatinine 0.93, proBNP 129 (12/15): K 4, creatinine 0.9, digoxin 0.4 (5/16): K 4, creatinine 0.86 (9/16): K 3.6, creatinine 0.86, digoxin < 0.2 (5/17): K 4.1, creatinine 0.95, digoxin 0.3 (5/18): K 3.4, creatinine 0.95, TSH elevated but free T3 and free T4 normal.  (8/18): K 3.9, creatinine 0.87, BNP 108  (11/18): K 3.5, creatinine 1.04, digoxin 0.4 (1/19): LDL 91, HDL 47 (3/19): digoxin 0.7, K 4, creatinine 0.99 (4/19): TSH normal, LDL 95 (9/19): LDL 99, HDL 45, K 4, creatinine 0.96 (10/19): K 3.6, creatinine 0.85, digoxin 0.5 (2/20): LDL 103 (3/20): K 3.9, creatinine 1.05 (4/20): K 3.9, creatinine 1.23, TSH 8, digoxin 0.3, LFTs normal  Current Outpatient Medications  Medication Sig Dispense Refill  . amiodarone (PACERONE) 200 MG tablet Take 200 mg by mouth daily.    Marland Kitchen atorvastatin (LIPITOR) 40 MG tablet TAKE 1 TABLET BY MOUTH ONCE DAILY (Patient taking differently: Take 40 mg by mouth daily at 6 PM. ) 30 tablet 3  . carvedilol (COREG) 25 MG tablet TAKE 1 TABLET BY MOUTH TWICE A DAY WITH A MEAL (Patient taking differently: Take 25 mg by mouth 2 (two) times daily with a meal. ) 60 tablet 6  . digoxin (LANOXIN) 0.125 MG tablet TAKE 1 TABLET BY MOUTH ONCE DAILY (Patient taking differently: Take 0.125 mg by mouth daily. ) 30 tablet 3  . ENTRESTO 24-26 MG TAKE 1 TABLET BY MOUTH TWICE A DAY (Patient taking differently: Take 1 tablet by mouth 2 (two) times daily. ) 60 tablet 5  . ivabradine (CORLANOR) 5 MG TABS tablet Take 1 tablet (5 mg total) by mouth 2 (two) times daily with a meal. 60 tablet 6  . levothyroxine (SYNTHROID) 25 MCG tablet Take 1 tablet (25 mcg total) by mouth daily before breakfast. 90 tablet 3  . metFORMIN (GLUCOPHAGE) 500 MG tablet Take 1 tablet (500 mg total) by mouth daily with breakfast. 30 tablet 0  . metolazone (ZAROXOLYN) 2.5 MG  tablet Take 1 tablet (2.5 mg total) by mouth daily. (Patient taking differently: Take 2.5 mg by mouth daily as needed (for fluid). ) 5 tablet 0  . potassium chloride SA (K-DUR,KLOR-CON) 20 MEQ tablet Take 3 tablets (60 mEq total) by mouth 2 (two) times daily. 180 tablet 5  . spironolactone (ALDACTONE) 25 MG tablet Take 1 tablet (25 mg total) by mouth at bedtime. 30 tablet 6  . torsemide (DEMADEX) 20 MG tablet Take 4 tablets (80 mg total) by mouth daily. 120 tablet 5  . Vitamin D, Ergocalciferol, (DRISDOL) 1.25 MG (50000 UT) CAPS capsule Take 1 capsule (50,000 Units total) by mouth every 7 (seven) days. (Patient taking differently: Take 50,000 Units by mouth every Wednesday. ) 4 capsule 0   No current facility-administered medications for this encounter.     Allergies:   Patient has no known allergies.   Social History:  The patient  reports that she has  never smoked. She has never used smokeless tobacco. She reports that she does not drink alcohol or use drugs.   Family History:  The patient's family history includes Heart disease in her maternal grandmother and mother; High blood pressure in her father; Obesity in her mother; Stroke in her father.   ROS:  Please see the history of present illness.   All other systems are personally reviewed and negative.   Exam:  (Video/Tele Health Call; Exam is subjective and or/visual.) General:  Speaks in full sentences. No resp difficulty. Lungs: Normal respiratory effort with conversation.  Abdomen: Non-distended per patient report Extremities: Pt denies edema. Neuro: Alert & oriented x 3.   Recent Labs: 09/27/2018: ALT 19; B Natriuretic Peptide 404.7; Hemoglobin 13.4; Magnesium 2.2; Platelets 343; TSH 8.532 09/30/2018: BUN 20; Creatinine, Ser 1.23; Potassium 3.9; Sodium 139  Personally reviewed   Wt Readings from Last 3 Encounters:  09/28/18 107.9 kg (237 lb 14.4 oz)  09/22/18 107.4 kg (236 lb 12.8 oz)  09/22/18 112.7 kg (248 lb 6.4 oz)       ASSESSMENT AND PLAN:  1. Chronic systolic HF: Nonischemic cardiomyopathy.  She has a Medtronic ICD.  Cause for cardiomyopathy is uncertain: initially noted peri-partum (after son's birth), so cannot rule out peri-partum CMP.  On and off, she has had frequent PVCs.   RHC in 8/15 showed normal filling pressures and preserved cardiac output.  She does not qualify for CRT with narrow QRS.  CPX 6/19 with low normal functional capacity.  Last echo in 10/19 showed EF 20-25% with moderate LV dilation and moderate MR. Weight is down and Optivol suggests normal volume status now that she has switched to torsemide.   - Continue torsemide 80 mg daily with KCl 60 bid. Check BMET and Mg today.  If she gains 3 lbs in a day or 4 lbs in a week, she can take and extra 40 mg torsemide in the pm x 2 days but will need to take an extra 40 mEq KCl.  - Continue Entresto 24/26 mg BID, no BP room to increase.    - Continue Coreg 25 mg bid and ivabradine 5 mg bid.   - Continue current spironolactone and digoxin, recent digoxin level ok.  - I will arrange for CPX when we are able to resume elective procedures.  I am concerned for worsening of her HF with ventricular arrhythmias and volume overload.  2. Pulmonary HTN: Mixed picture with PVR 8.1 on RHC at [facility].  However, repeat study in 8/15 here showed no significant pulmonary hypertension.  3. Morbid obesity: Continue to work on weight loss.   4. Hyperlipidemia: Good lipids in 9/19.   5. PVCs: Worsened with 11% PVCs + NSVT on 2/20 Zio patch.  Amiodarone started, but PVCs up to 13.3% with NSVT on 3/20 Zio patch. She saw Dr. [name] who thought that the ventricular arrhythmias were electromechanical due to worsening HF.  In 4/20, she had VT x 2 with ICD discharges in the setting of hypokalemia.  - Continue amiodarone, recent LFTs normal.  She will need a regular eye exam with amiodarone use.  - Check K and Mg.  6. Hypothyroidism: She is on levothyroxine now.   COVID screen The  patient does not have any symptoms that suggest any further testing/ screening at this time.  Social distancing reinforced today.  Relevant cardiac medications were reviewed at length with the patient today.   The patient does not have concerns regarding their medications at this  time.   Recommended follow-up:  6 wks  Today, I have spent 21 minutes with the patient with telehealth technology discussing the above issues .    Signed, [name], MD  10/05/2018 10:42 PM  [facility] [address] [facility] [place] [address] [phone] (office) [phone] (fax)

## 2018-10-06 NOTE — Progress Notes (Signed)
Call to patient for follow up on transmission.  She advised Dr [name] reviewed the transmission during virtual visit yesterday.  She understands if she has symptoms, Dr [name] instructed her to take extra 40 mg torsemide in the pm x 2 days and take extra 40 mEq KCl.  Current weight is 237-238 lbs at home.  She will have BMET drawn next week.  Advised if she has symptoms and wants a report reviewed before next month to call and will assist with sending a remote transmission for review.  Next remote transmission 11/08/2018.

## 2018-10-12 ENCOUNTER — Encounter: Payer: Self-pay | Admitting: Cardiology

## 2018-10-12 ENCOUNTER — Other Ambulatory Visit: Payer: Self-pay

## 2018-10-12 ENCOUNTER — Ambulatory Visit (HOSPITAL_COMMUNITY)
Admission: RE | Admit: 2018-10-12 | Discharge: 2018-10-12 | Disposition: A | Discharge status: Home / Self Care | Payer: BLUE CROSS/BLUE SHIELD | Source: Ambulatory Visit | Attending: Internal Medicine | Admitting: Internal Medicine

## 2018-10-12 DIAGNOSIS — I5022 Chronic systolic (congestive) heart failure: Secondary | ICD-10-CM

## 2018-10-12 LAB — BASIC METABOLIC PANEL
Anion gap: 11 (ref 5–15)
BUN: 11 mg/dL (ref 6–20)
CO2: 24 mmol/L (ref 22–32)
Calcium: 9 mg/dL (ref 8.9–10.3)
Chloride: 101 mmol/L (ref 98–111)
Creatinine, Ser: 1.15 mg/dL — ABNORMAL HIGH (ref 0.44–1.00)
GFR calc Af Amer: >60 mL/min (ref >60)
GFR calc non Af Amer: 57 mL/min — ABNORMAL LOW (ref >60)
Glucose, Bld: 108 mg/dL — ABNORMAL HIGH (ref 70–99)
Potassium: 3.6 mmol/L (ref 3.5–5.1)
Sodium: 136 mmol/L (ref 135–145)

## 2018-10-12 LAB — MAGNESIUM: Magnesium: 1.9 mg/dL (ref 1.7–2.4)

## 2018-10-12 NOTE — Progress Notes (Signed)
Remote ICD transmission.   

## 2018-10-13 ENCOUNTER — Other Ambulatory Visit: Payer: Self-pay | Admitting: *Deleted

## 2018-10-14 ENCOUNTER — Ambulatory Visit (INDEPENDENT_AMBULATORY_CARE_PROVIDER_SITE_OTHER): Payer: BLUE CROSS/BLUE SHIELD | Admitting: Family Medicine

## 2018-10-14 ENCOUNTER — Encounter (INDEPENDENT_AMBULATORY_CARE_PROVIDER_SITE_OTHER): Payer: Self-pay | Admitting: Family Medicine

## 2018-10-14 ENCOUNTER — Other Ambulatory Visit: Payer: Self-pay

## 2018-10-14 DIAGNOSIS — Z6841 Body Mass Index (BMI) 40.0 and over, adult: Secondary | ICD-10-CM

## 2018-10-14 DIAGNOSIS — E559 Vitamin D deficiency, unspecified: Secondary | ICD-10-CM | POA: Diagnosis not present

## 2018-10-14 DIAGNOSIS — E8881 Metabolic syndrome: Secondary | ICD-10-CM | POA: Diagnosis not present

## 2018-10-14 MED ORDER — VITAMIN D (ERGOCALCIFEROL) 1.25 MG (50000 UNIT) PO CAPS: 50000.0000 [IU] | ORAL_CAPSULE | ORAL | 0 refills | Status: DC

## 2018-10-14 MED ORDER — METFORMIN HCL 500 MG PO TABS: 500.0000 mg | ORAL_TABLET | Freq: Every day | ORAL | 0 refills | Status: DC

## 2018-10-14 NOTE — Progress Notes (Signed)
Office: [phone]  /  Fax: [phone] TeleHealth Visit:  [name] has verbally consented to this TeleHealth visit today. The patient is located at home, the provider is located at the [employer] [facility] office. The participants in this visit include the listed provider and patient. The visit was conducted today via Webex.  HPI:   Chief Complaint: OBESITY [name] is here to discuss her progress with her obesity treatment plan. She is on the Category 3 plan and is following her eating plan approximately 20% of the time. She states she is exercising 0 minutes 0 times per week. [name]'s last office visit was 08/17/2018. She is struggling with weight gain which she attributes to starting levothyroxine. She states she had not been eating protein until the past week. She states she weighed 239.2 lbs at home; at her last office visit her weight was 249 lbs so she has lost weight. She is avoiding microwave meals due to the sodium which she limits due to CHF. We were unable to weigh the patient today for this TeleHealth visit. She feels as if she has lost weight since her last visit. She has lost 28 lbs since starting treatment with [name].  Vitamin D deficiency [name] has a diagnosis of Vitamin D deficiency, which is not at goal. Her last Vitamin D level was reported to be 29.2 on 08/17/2018. She is currently taking prescription Vit D and denies nausea, vomiting or muscle weakness. She does report fatigue.  Insulin Resistance [name] has a diagnosis of insulin resistance based on her elevated fasting insulin level >5. Although [name]'s blood glucose readings are still under good control, insulin resistance puts her at greater risk of metabolic syndrome and diabetes. She is taking metformin currently and continues to work on diet and exercise to decrease risk of diabetes. [name] states her polyphagia has increased but declines an extra dose of metformin. Lab Results  Component Value Date   HGBA1C  5.4 08/17/2018    ASSESSMENT AND PLAN:  Vitamin D deficiency - Plan: Vitamin D, Ergocalciferol, (DRISDOL) 1.25 MG (50000 UT) CAPS capsule  Insulin resistance - Plan: metFORMIN (GLUCOPHAGE) 500 MG tablet  Class 3 severe obesity with serious comorbidity and body mass index (BMI) of 40.0 to 44.9 in adult, unspecified obesity type (HCC)  PLAN:  Vitamin D Deficiency [name] was informed that low Vitamin D levels contributes to fatigue and are associated with obesity, breast, and colon cancer. She agrees to continue to take prescription Vit D @ 50,000 IU every week #4 with 0 refills and will follow-up for routine testing of Vitamin D, at least 2-3 times per year. She was informed of the risk of over-replacement of Vitamin D and agrees to not increase her dose unless she discusses this with [name] first. [name] agrees to follow-up with our clinic in 2 weeks.  Insulin Resistance [name] will continue to work on weight loss, exercise, and decreasing simple carbohydrates in her diet to help decrease the risk of diabetes. We dicussed metformin including benefits and risks. She was informed that eating too many simple carbohydrates or too many calories at one sitting increases the likelihood of GI side effects. [name] is currently on metformin and a refill prescription was written today for 500 mg QAM #30 with 0 refills. [name] agrees to follow-up with our clinic in 2 weeks.  Obesity [name] is currently in the action stage of change. As such, her goal is to continue with weight loss efforts. She has agreed to follow the  Category 3 plan. Handouts on Additional Lunch Options were sent to the patient via MyChart. We discussed the following Behavioral Modification Strategies today: increasing lean protein intake, work on meal planning, easy cooking plans, and planning for success.  [name] has agreed to follow-up with our clinic in 2 weeks. She was informed of the importance of frequent follow-up visits to maximize her  success with intensive lifestyle modifications for her multiple health conditions.  ALLERGIES: No Known Allergies  MEDICATIONS: Current Outpatient Medications on File Prior to Visit  Medication Sig Dispense Refill  . amiodarone (PACERONE) 200 MG tablet Take 200 mg by mouth daily.    Marland Kitchen atorvastatin (LIPITOR) 40 MG tablet TAKE 1 TABLET BY MOUTH ONCE DAILY (Patient taking differently: Take 40 mg by mouth daily at 6 PM. ) 30 tablet 3  . carvedilol (COREG) 25 MG tablet TAKE 1 TABLET BY MOUTH TWICE A DAY WITH A MEAL (Patient taking differently: Take 25 mg by mouth 2 (two) times daily with a meal. ) 60 tablet 6  . digoxin (LANOXIN) 0.125 MG tablet TAKE 1 TABLET BY MOUTH ONCE DAILY (Patient taking differently: Take 0.125 mg by mouth daily. ) 30 tablet 3  . ENTRESTO 24-26 MG TAKE 1 TABLET BY MOUTH TWICE A DAY (Patient taking differently: Take 1 tablet by mouth 2 (two) times daily. ) 60 tablet 5  . ivabradine (CORLANOR) 5 MG TABS tablet Take 1 tablet (5 mg total) by mouth 2 (two) times daily with a meal. 60 tablet 6  . levothyroxine (SYNTHROID) 25 MCG tablet Take 1 tablet (25 mcg total) by mouth daily before breakfast. 90 tablet 3  . metolazone (ZAROXOLYN) 2.5 MG tablet Take 1 tablet (2.5 mg total) by mouth daily. (Patient taking differently: Take 2.5 mg by mouth daily as needed (for fluid). ) 5 tablet 0  . potassium chloride SA (K-DUR,KLOR-CON) 20 MEQ tablet Take 3 tablets (60 mEq total) by mouth 2 (two) times daily. 180 tablet 5  . spironolactone (ALDACTONE) 25 MG tablet Take 1 tablet (25 mg total) by mouth at bedtime. 30 tablet 6  . torsemide (DEMADEX) 20 MG tablet Take 4 tablets (80 mg total) by mouth daily. 120 tablet 5   No current facility-administered medications on file prior to visit.     PAST MEDICAL HISTORY: Past Medical History:  Diagnosis Date  . Automatic implantable cardioverter-defibrillator in situ   . Chronic CHF (congestive heart failure) (HCC)    a. EF 15-20% b. RHC (09/2013) RA  14, RV 57/22, PA 64/36 (48), PCWP 18, FIck CO/CI 3.7/1.6, PVR 8.1 WU, PA sat 47%   . History of stomach ulcers   . Hypertension   . Hypotension   . Hypothyroidism   . Morbid obesity (HCC)   . Myocardial infarction (HCC) 08/2013  . Nocturnal dyspnea   . Nonischemic cardiomyopathy (HCC)   . Sinus tachycardia   . Snoring-prob OSA 09/04/2011  . Sprint Fidelis ICD lead RECALL  [id number]     PAST SURGICAL HISTORY: Past Surgical History:  Procedure Laterality Date  . BREATH TEK H PYLORI N/A 11/09/2014   Procedure: BREATH TEK H PYLORI;  Surgeon: Gaynelle Adu, MD;  Location: [facility];  Service: General;  Laterality: N/A;  . CARDIAC CATHETERIZATION  ~ 2006; 09/2013  . CARDIAC CATHETERIZATION N/A 05/23/2016   Procedure: Right Heart Cath;  Surgeon: Laurey Morale, MD;  Location: [facility] INVASIVE CV LAB;  Service: Cardiovascular;  Laterality: N/A;  . CARDIAC DEFIBRILLATOR PLACEMENT  2006; 12/26/2013   Medtronic [id number]; [id number]  ICD gen change and RV lead revision with new 6935 RV lead by Dr Graciela Husbands  . CESAREAN SECTION  1999  . IMPLANTABLE CARDIOVERTER DEFIBRILLATOR GENERATOR CHANGE N/A 12/26/2013   Procedure: IMPLANTABLE CARDIOVERTER DEFIBRILLATOR GENERATOR CHANGE;  Surgeon: Duke Salvia, MD;  Location: [facility] CATH LAB;  Service: Cardiovascular;  Laterality: N/A;  . LEAD REVISION N/A 12/26/2013   Procedure: LEAD REVISION;  Surgeon: Duke Salvia, MD;  Location: [facility] CATH LAB;  Service: Cardiovascular;  Laterality: N/A;  . RIGHT HEART CATHETERIZATION N/A 02/15/2014   Procedure: RIGHT HEART CATH;  Surgeon: Laurey Morale, MD;  Location: [facility] CATH LAB;  Service: Cardiovascular;  Laterality: N/A;  . TUBAL LIGATION  1999    SOCIAL HISTORY: Social History   Tobacco Use  . Smoking status: Never Smoker  . Smokeless tobacco: Never Used  Substance Use Topics  . Alcohol use: No  . Drug use: No    FAMILY HISTORY: Family History  Problem Relation Age of Onset  . Heart disease Maternal Grandmother   . Heart  disease Mother   . Obesity Mother   . High blood pressure Father   . Stroke Father    ROS: Review of Systems  Constitutional: Positive for malaise/fatigue.  Gastrointestinal: Negative for nausea and vomiting.  Musculoskeletal:       Negative for muscle weakness.  Endo/Heme/Allergies:       Positive for increased polyphagia.   PHYSICAL EXAM: Pt in no acute distress  RECENT LABS AND TESTS: BMET    Component Value Date/Time   NA 136 10/12/2018 0916   NA 137 03/10/2018 1507   NA 138 09/24/2013 0536   K 3.6 10/12/2018 0916   K 3.7 09/24/2013 0536   CL 101 10/12/2018 0916   CL 107 09/24/2013 0536   CO2 24 10/12/2018 0916   CO2 25 09/24/2013 0536   GLUCOSE 108 (H) 10/12/2018 0916   GLUCOSE 82 09/24/2013 0536   BUN 11 10/12/2018 0916   BUN 10 03/10/2018 1507   BUN 11 09/24/2013 0536   CREATININE 1.15 (H) 10/12/2018 0916   CREATININE 1.08 09/24/2013 0536   CALCIUM 9.0 10/12/2018 0916   CALCIUM 8.6 09/24/2013 0536   GFRNONAA 57 (L) 10/12/2018 0916   GFRNONAA >60 09/24/2013 0536   GFRAA >60 10/12/2018 0916   GFRAA >60 09/24/2013 0536   Lab Results  Component Value Date   HGBA1C 5.4 08/17/2018   HGBA1C 5.4 03/10/2018   HGBA1C 5.6 10/05/2017   HGBA1C 5.9 09/19/2013   Lab Results  Component Value Date   INSULIN 15.6 08/17/2018   INSULIN 7.9 03/10/2018   INSULIN 22.5 10/05/2017   CBC    Component Value Date/Time   WBC 12.8 (H) 09/27/2018 0024   RBC 5.44 (H) 09/27/2018 0024   HGB 13.4 09/27/2018 0024   HGB 13.6 10/05/2017 1218   HCT 44.5 09/27/2018 0024   HCT 41.7 10/05/2017 1218   PLT 343 09/27/2018 0024   PLT 195 09/23/2013 0443   MCV 81.8 09/27/2018 0024   MCV 86 10/05/2017 1218   MCV 85 09/23/2013 0443   MCH 24.6 (L) 09/27/2018 0024   MCHC 30.1 09/27/2018 0024   RDW 14.6 09/27/2018 0024   RDW 14.5 10/05/2017 1218   RDW 15.4 (H) 09/23/2013 0443   LYMPHSABS 3.3 09/27/2018 0024   LYMPHSABS 1.9 10/05/2017 1218   LYMPHSABS 2.0 09/23/2013 0443   MONOABS  1.4 (H) 09/27/2018 0024   MONOABS 0.4 09/23/2013 0443   EOSABS 0.1 09/27/2018 0024   EOSABS 0.2  10/05/2017 1218   EOSABS 0.1 09/23/2013 0443   BASOSABS 0.1 09/27/2018 0024   BASOSABS 0.0 10/05/2017 1218   BASOSABS 0.1 09/23/2013 0443   Iron/TIBC/Ferritin/ %Sat No results found for: IRON, TIBC, FERRITIN, IRONPCTSAT Lipid Panel     Component Value Date/Time   CHOL 171 08/17/2018 1147   CHOL 183 09/20/2013 0424   TRIG 111 08/17/2018 1147   TRIG 105 09/20/2013 0424   HDL 46 08/17/2018 1147   HDL 40 09/20/2013 0424   CHOLHDL 3.1 03/10/2018 1507   CHOLHDL 3.3 07/21/2017 0854   VLDL 18 07/21/2017 0854   VLDL 21 09/20/2013 0424   LDLCALC 103 (H) 08/17/2018 1147   LDLCALC 122 (H) 09/20/2013 0424   Hepatic Function Panel     Component Value Date/Time   PROT 8.2 (H) 09/27/2018 0024   PROT 7.8 03/10/2018 1507   PROT 7.7 09/19/2013 0431   ALBUMIN 4.0 09/27/2018 0024   ALBUMIN 4.5 03/10/2018 1507   ALBUMIN 3.5 09/19/2013 0431   AST 19 09/27/2018 0024   AST 36 09/19/2013 0431   ALT 19 09/27/2018 0024   ALT 32 09/19/2013 0431   ALKPHOS 70 09/27/2018 0024   ALKPHOS 65 09/19/2013 0431   BILITOT 0.8 09/27/2018 0024   BILITOT 0.4 03/10/2018 1507   BILITOT 0.4 09/19/2013 0431   BILIDIR <0.1 (L) 07/21/2017 0854   IBILI NOT CALCULATED 07/21/2017 0854      Component Value Date/Time   TSH 8.532 (H) 09/27/2018 0159   TSH 12.027 (H) 09/22/2018 1047   TSH 3.680 10/05/2017 1218   TSH 5.995 (H) 11/14/2016 1354   TSH 2.90 02/29/2008 1132   Results for ADILA, GONZAGA (MRN [id number]) as of 10/14/2018 14:21  Ref. Range 08/17/2018 11:47  Vitamin D, 25-Hydroxy Latest Ref Range: 30.0 - 100.0 ng/mL 29.2 (L)   I, Marianna Payment, am acting as Energy manager for [place], FNP-C.  I have reviewed the above documentation for accuracy and completeness, and I agree with the above.  - Jeanne Terrance, FNP-C.

## 2018-10-18 NOTE — Patient Outreach (Signed)
[facility]) Care Management  10/18/2018  GAYANNE PRESCOTT 08-02-1970 [id number]   Encounter error.  Marthenia Rolling, MSN-Ed, RN,BSN [place] Acute Care Coordinator [phone]

## 2018-10-27 ENCOUNTER — Other Ambulatory Visit: Payer: Self-pay | Admitting: Cardiology

## 2018-10-28 ENCOUNTER — Other Ambulatory Visit: Payer: Self-pay

## 2018-10-28 ENCOUNTER — Ambulatory Visit (INDEPENDENT_AMBULATORY_CARE_PROVIDER_SITE_OTHER): Payer: BLUE CROSS/BLUE SHIELD | Admitting: Family Medicine

## 2018-10-28 ENCOUNTER — Encounter (INDEPENDENT_AMBULATORY_CARE_PROVIDER_SITE_OTHER): Payer: Self-pay | Admitting: Family Medicine

## 2018-10-28 DIAGNOSIS — Z6841 Body Mass Index (BMI) 40.0 and over, adult: Secondary | ICD-10-CM | POA: Diagnosis not present

## 2018-10-28 DIAGNOSIS — E559 Vitamin D deficiency, unspecified: Secondary | ICD-10-CM

## 2018-10-28 DIAGNOSIS — E8881 Metabolic syndrome: Secondary | ICD-10-CM | POA: Diagnosis not present

## 2018-10-28 MED ORDER — METFORMIN HCL 500 MG PO TABS: 500.0000 mg | ORAL_TABLET | Freq: Every day | ORAL | 0 refills | Status: DC

## 2018-10-28 MED ORDER — VITAMIN D (ERGOCALCIFEROL) 1.25 MG (50000 UNIT) PO CAPS: 50000.0000 [IU] | ORAL_CAPSULE | ORAL | 0 refills | Status: DC

## 2018-10-28 NOTE — Progress Notes (Signed)
Office: [phone]  /  Fax: [phone] TeleHealth Visit:  MCKYNNA VANLOAN has verbally consented to this TeleHealth visit today. The patient is located at home, the provider is located at the [employer] [facility] office. The participants in this visit include the listed provider and patient. The visit was conducted today via Webex.  HPI:   Chief Complaint: OBESITY Rudy is here to discuss her progress with her obesity treatment plan. She is on the Category 3 plan and is following her eating plan approximately 100% of the time. She states she is walking 5-10 minutes 3 times per week. Shatasia states she weighed 235 lbs today. She has been eating the extra lunch options over the last few weeks. She states Cardiology allows 2 liters of water a day. She is recommitted to the plan and is following it 100%. She does report struggling with amount of protein at dinner. We were unable to weigh the patient today for this TeleHealth visit. She states her weight today is 235 lbs. She has lost 28 lbs since starting treatment with [name].  Insulin Resistance Daishia has a diagnosis of insulin resistance based on her elevated fasting insulin level >5. Although Laurey's blood glucose readings are still under good control, insulin resistance puts her at greater risk of metabolic syndrome and diabetes. She is taking metformin currently and continues to work on diet and exercise to decrease risk of diabetes. No polyphagia. Lab Results  Component Value Date   HGBA1C 5.4 08/17/2018    Vitamin D deficiency Momo has a diagnosis of Vitamin D deficiency, which is not at goal. Her last Vitamin D level was reported to be 29.2 on 08/17/2018. She is currently taking prescription Vit D and denies nausea, vomiting or muscle weakness.  ASSESSMENT AND PLAN:  Insulin resistance - Plan: metFORMIN (GLUCOPHAGE) 500 MG tablet  Vitamin D deficiency - Plan: Vitamin D, Ergocalciferol, (DRISDOL) 1.25 MG (50000 UT) CAPS  capsule  Class 3 severe obesity with serious comorbidity and body mass index (BMI) of 40.0 to 44.9 in adult, unspecified obesity type ([place])  PLAN:  Insulin Resistance Odessie will continue to work on weight loss, exercise, and decreasing simple carbohydrates in her diet to help decrease the risk of diabetes.  Makenlee is currently taking metformin and a refill prescription was written today for 500 mg QAM #30 with 0 refills. She agrees to follow-up with our clinic in 2 weeks.  Vitamin D Deficiency Rosamond was informed that low Vitamin D levels contributes to fatigue and are associated with obesity, breast, and colon cancer. She agrees to continue to take prescription Vit D @ 50,000 IU every week #4 with 0 refills and will follow-up for routine testing of Vitamin D, at least 2-3 times per year. She was informed of the risk of over-replacement of Vitamin D and agrees to not increase her dose unless she discusses this with [name] first. Mandeep agrees to follow-up with our clinic in 2 weeks.  Obesity Nasim is currently in the action stage of change. As such, her goal is to continue with weight loss efforts. She has agreed to follow the Category 3 plan. Information was sent to the patient via MyChart on substitutions for 2 ounces of meat. Yarisbel has been instructed to continue her current exercise regimen and increase slowly for weight loss and overall health benefits. We discussed the following Behavioral Modification Strategies today: increasing lean protein intake and planning for success.  Kaily has agreed to follow-up with our clinic in 2  weeks. She was informed of the importance of frequent follow-up visits to maximize her success with intensive lifestyle modifications for her multiple health conditions.  ALLERGIES: No Known Allergies  MEDICATIONS: Current Outpatient Medications on File Prior to Visit  Medication Sig Dispense Refill   amiodarone (PACERONE) 200 MG tablet Take 200 mg by mouth daily.      atorvastatin (LIPITOR) 40 MG tablet TAKE 1 TABLET BY MOUTH ONCE DAILY (Patient taking differently: Take 40 mg by mouth daily at 6 PM. ) 30 tablet 3   carvedilol (COREG) 25 MG tablet TAKE 1 TABLET BY MOUTH TWICE A DAY WITH A MEAL (Patient taking differently: Take 25 mg by mouth 2 (two) times daily with a meal. ) 60 tablet 6   digoxin (LANOXIN) 0.125 MG tablet TAKE 1 TABLET BY MOUTH ONCE DAILY (Patient taking differently: Take 0.125 mg by mouth daily. ) 30 tablet 3   ENTRESTO 24-26 MG TAKE 1 TABLET BY MOUTH TWICE A DAY (Patient taking differently: Take 1 tablet by mouth 2 (two) times daily. ) 60 tablet 5   ivabradine (CORLANOR) 5 MG TABS tablet Take 1 tablet (5 mg total) by mouth 2 (two) times daily with a meal. 60 tablet 6   levothyroxine (SYNTHROID) 25 MCG tablet Take 1 tablet (25 mcg total) by mouth daily before breakfast. 90 tablet 3   metolazone (ZAROXOLYN) 2.5 MG tablet Take 1 tablet (2.5 mg total) by mouth daily. (Patient taking differently: Take 2.5 mg by mouth daily as needed (for fluid). ) 5 tablet 0   potassium chloride SA (K-DUR,KLOR-CON) 20 MEQ tablet Take 3 tablets (60 mEq total) by mouth 2 (two) times daily. 180 tablet 5   spironolactone (ALDACTONE) 25 MG tablet Take 1 tablet (25 mg total) by mouth at bedtime. 30 tablet 6   torsemide (DEMADEX) 20 MG tablet Take 4 tablets (80 mg total) by mouth daily. 120 tablet 5   No current facility-administered medications on file prior to visit.     PAST MEDICAL HISTORY: Past Medical History:  Diagnosis Date   Automatic implantable cardioverter-defibrillator in situ    Chronic CHF (congestive heart failure) ([place])    a. EF 15-20% b. RHC (09/2013) RA 14, RV 57/22, PA 64/36 (48), PCWP 18, FIck CO/CI 3.7/1.6, PVR 8.1 WU, PA sat 47%    History of stomach ulcers    Hypertension    Hypotension    Hypothyroidism    Morbid obesity ([place])    Myocardial infarction ([place]) 08/2013   Nocturnal dyspnea    Nonischemic cardiomyopathy  ([place])    Sinus tachycardia    Snoring-prob OSA 09/04/2011   Sprint Fidelis ICD lead RECALL  [id number]     PAST SURGICAL HISTORY: Past Surgical History:  Procedure Laterality Date   BREATH TEK H PYLORI N/A 11/09/2014   Procedure: BREATH TEK H PYLORI;  Surgeon: Greer Pickerel, MD;  Location: [facility];  Service: General;  Laterality: N/A;   CARDIAC CATHETERIZATION  ~ 2006; 09/2013   CARDIAC CATHETERIZATION N/A 05/23/2016   Procedure: Right Heart Cath;  Surgeon: Larey Dresser, MD;  Location: [place] CV LAB;  Service: Cardiovascular;  Laterality: N/A;   CARDIAC DEFIBRILLATOR PLACEMENT  2006; 12/26/2013   Medtronic [id number]; 12-2013 ICD gen change and RV lead revision with new 6935 RV lead by Dr [name]   CESAREAN SECTION  1999   IMPLANTABLE CARDIOVERTER DEFIBRILLATOR GENERATOR CHANGE N/A 12/26/2013   Procedure: IMPLANTABLE CARDIOVERTER DEFIBRILLATOR GENERATOR CHANGE;  Surgeon: Deboraha Sprang, MD;  Location: [facility] CATH LAB;  Service: Cardiovascular;  Laterality: N/A;   LEAD REVISION N/A 12/26/2013   Procedure: LEAD REVISION;  Surgeon: Deboraha Sprang, MD;  Location: [facility] CATH LAB;  Service: Cardiovascular;  Laterality: N/A;   RIGHT HEART CATHETERIZATION N/A 02/15/2014   Procedure: RIGHT HEART CATH;  Surgeon: Larey Dresser, MD;  Location: [facility] CATH LAB;  Service: Cardiovascular;  Laterality: N/A;   TUBAL LIGATION  1999    SOCIAL HISTORY: Social History   Tobacco Use   Smoking status: Never Smoker   Smokeless tobacco: Never Used  Substance Use Topics   Alcohol use: No   Drug use: No    FAMILY HISTORY: Family History  Problem Relation Age of Onset   Heart disease Maternal Grandmother    Heart disease Mother    Obesity Mother    High blood pressure Father    Stroke Father    ROS: Review of Systems  Gastrointestinal: Negative for nausea and vomiting.  Musculoskeletal:       Negative for muscle weakness.  Endo/Heme/Allergies:       Negative for polyphagia.    PHYSICAL EXAM: Pt in no acute distress  RECENT LABS AND TESTS: BMET    Component Value Date/Time   NA 136 10/12/2018 0916   NA 137 03/10/2018 1507   NA 138 09/24/2013 0536   K 3.6 10/12/2018 0916   K 3.7 09/24/2013 0536   CL 101 10/12/2018 0916   CL 107 09/24/2013 0536   CO2 24 10/12/2018 0916   CO2 25 09/24/2013 0536   GLUCOSE 108 (H) 10/12/2018 0916   GLUCOSE 82 09/24/2013 0536   BUN 11 10/12/2018 0916   BUN 10 03/10/2018 1507   BUN 11 09/24/2013 0536   CREATININE 1.15 (H) 10/12/2018 0916   CREATININE 1.08 09/24/2013 0536   CALCIUM 9.0 10/12/2018 0916   CALCIUM 8.6 09/24/2013 0536   GFRNONAA 57 (L) 10/12/2018 0916   GFRNONAA >60 09/24/2013 0536   GFRAA >60 10/12/2018 0916   GFRAA >60 09/24/2013 0536   Lab Results  Component Value Date   HGBA1C 5.4 08/17/2018   HGBA1C 5.4 03/10/2018   HGBA1C 5.6 10/05/2017   HGBA1C 5.9 09/19/2013   Lab Results  Component Value Date   INSULIN 15.6 08/17/2018   INSULIN 7.9 03/10/2018   INSULIN 22.5 10/05/2017   CBC    Component Value Date/Time   WBC 12.8 (H) 09/27/2018 0024   RBC 5.44 (H) 09/27/2018 0024   HGB 13.4 09/27/2018 0024   HGB 13.6 10/05/2017 1218   HCT 44.5 09/27/2018 0024   HCT 41.7 10/05/2017 1218   PLT 343 09/27/2018 0024   PLT 195 09/23/2013 0443   MCV 81.8 09/27/2018 0024   MCV 86 10/05/2017 1218   MCV 85 09/23/2013 0443   MCH 24.6 (L) 09/27/2018 0024   MCHC 30.1 09/27/2018 0024   RDW 14.6 09/27/2018 0024   RDW 14.5 10/05/2017 1218   RDW 15.4 (H) 09/23/2013 0443   LYMPHSABS 3.3 09/27/2018 0024   LYMPHSABS 1.9 10/05/2017 1218   LYMPHSABS 2.0 09/23/2013 0443   MONOABS 1.4 (H) 09/27/2018 0024   MONOABS 0.4 09/23/2013 0443   EOSABS 0.1 09/27/2018 0024   EOSABS 0.2 10/05/2017 1218   EOSABS 0.1 09/23/2013 0443   BASOSABS 0.1 09/27/2018 0024   BASOSABS 0.0 10/05/2017 1218   BASOSABS 0.1 09/23/2013 0443   Iron/TIBC/Ferritin/ %Sat No results found for: IRON, TIBC, FERRITIN, IRONPCTSAT Lipid Panel      Component Value Date/Time   CHOL 171 08/17/2018 1147   CHOL  183 09/20/2013 0424   TRIG 111 08/17/2018 1147   TRIG 105 09/20/2013 0424   HDL 46 08/17/2018 1147   HDL 40 09/20/2013 0424   CHOLHDL 3.1 03/10/2018 1507   CHOLHDL 3.3 07/21/2017 0854   VLDL 18 07/21/2017 0854   VLDL 21 09/20/2013 0424   LDLCALC 103 (H) 08/17/2018 1147   LDLCALC 122 (H) 09/20/2013 0424   Hepatic Function Panel     Component Value Date/Time   PROT 8.2 (H) 09/27/2018 0024   PROT 7.8 03/10/2018 1507   PROT 7.7 09/19/2013 0431   ALBUMIN 4.0 09/27/2018 0024   ALBUMIN 4.5 03/10/2018 1507   ALBUMIN 3.5 09/19/2013 0431   AST 19 09/27/2018 0024   AST 36 09/19/2013 0431   ALT 19 09/27/2018 0024   ALT 32 09/19/2013 0431   ALKPHOS 70 09/27/2018 0024   ALKPHOS 65 09/19/2013 0431   BILITOT 0.8 09/27/2018 0024   BILITOT 0.4 03/10/2018 1507   BILITOT 0.4 09/19/2013 0431   BILIDIR <0.1 (L) 07/21/2017 0854   IBILI NOT CALCULATED 07/21/2017 0854      Component Value Date/Time   TSH 8.532 (H) 09/27/2018 0159   TSH 12.027 (H) 09/22/2018 1047   TSH 3.680 10/05/2017 1218   TSH 5.995 (H) 11/14/2016 1354   TSH 2.90 02/29/2008 1132   Results for SYNIA, DOUGLASS (MRN [id number]) as of 10/28/2018 12:14  Ref. Range 08/17/2018 11:47  Vitamin D, 25-Hydroxy Latest Ref Range: 30.0 - 100.0 ng/mL 29.2 (L)    I, Michaelene Song, am acting as Location manager for [employer], FNP-C.  I have reviewed the above documentation for accuracy and completeness, and I agree with the above.  - Dawn Whitmire, FNP-C.

## 2018-11-01 ENCOUNTER — Encounter (INDEPENDENT_AMBULATORY_CARE_PROVIDER_SITE_OTHER): Payer: Self-pay | Admitting: Family Medicine

## 2018-11-03 ENCOUNTER — Other Ambulatory Visit (HOSPITAL_COMMUNITY): Payer: Self-pay | Admitting: *Deleted

## 2018-11-03 MED ORDER — TORSEMIDE 20 MG PO TABS: 80.0000 mg | ORAL_TABLET | Freq: Every day | ORAL | 5 refills | Status: DC

## 2018-11-08 ENCOUNTER — Other Ambulatory Visit: Payer: Self-pay

## 2018-11-08 ENCOUNTER — Ambulatory Visit (INDEPENDENT_AMBULATORY_CARE_PROVIDER_SITE_OTHER): Payer: BLUE CROSS/BLUE SHIELD

## 2018-11-08 DIAGNOSIS — I5022 Chronic systolic (congestive) heart failure: Secondary | ICD-10-CM

## 2018-11-08 DIAGNOSIS — Z95810 Presence of automatic (implantable) cardiac defibrillator: Secondary | ICD-10-CM | POA: Diagnosis not present

## 2018-11-09 NOTE — Progress Notes (Signed)
EPIC Encounter for ICM Monitoring  Patient Name: [name] is a 48 y.o. female Date: 11/09/2018 Primary Care Physican: [name], MD Primary Cardiologist: [name] Electrophysiologist: [name] Weight: 238 lbs (4/7 discharge weight) 11/09/2018 Weight: 232 lbs                                                          Transmission reviewed. Patient has been symptomatic with weight gain of 8-9 lbs since last week.  She took extra Torsemide and Potassium yesterday and weight returned to baseline today, 232 lbs.  She ate a few frozen meals and thinks that caused the fluid retention.   Optivol Thoracic impedance abnormal suggestive of fluid fluid accumulation since 11/02/2018.  Taking: Torsemide 20 mg take 4 tablets (80 mg total) daily.          Potassium 20 mEq take 3 tablets (60 mEq total) twice a day.                Spironolactone 25 mg take 1 tablet daily.           Metolazone 2.5 mg Take 2.5 mg by mouth daily as needed (for fluid).   Labs: 10/12/2018 Creatinine 1.15, BUN 11, Potassium 3.6, Sodium 136, GFR 57->60 09/30/2018 Creatinine 1.23, BUN 20, Potassium 3.9, Sodium 139, GFR 52->60 09/28/2018 Creatinine 1.04, BUN 22, Potassium 3.1, Sodium 136, GFR >60  09/27/2018 Potassium 3.8 (3:18 PM)  09/27/2018 Creatinine 1.43, BUN 28, Potassium 2.5, Sodium 130, GFR 44-50 (12:24 AM)  09/22/2018 Creatinine 1.37, BUN 17, Potassium 4.0, Sodium 139, GFR 46-53  08/24/2018 Creatinine 1.05, BUN 13, Potassium 3.9, Sodium 139, GFR >60  08/10/2018 Creatinine 0.98, BUN 8,   Potassium 3.0, Sodium 138, GFR >60  A complete set of results can be found in Results Review.  Recommendations: No changes and she is aware to follow low sodium diet.  Follow-up plan: ICM clinic phone appointment on 11/16/2018 to recheck fluid levels.   Office appt 11/23/2018 with Dr. [name].    3 month ICM trend: 11/08/2018    1 Year ICM trend:       [name], RN 11/09/2018 9:22 AM

## 2018-11-16 ENCOUNTER — Ambulatory Visit (INDEPENDENT_AMBULATORY_CARE_PROVIDER_SITE_OTHER): Payer: BLUE CROSS/BLUE SHIELD

## 2018-11-16 ENCOUNTER — Ambulatory Visit (INDEPENDENT_AMBULATORY_CARE_PROVIDER_SITE_OTHER): Payer: BLUE CROSS/BLUE SHIELD | Admitting: Physician Assistant

## 2018-11-16 ENCOUNTER — Other Ambulatory Visit: Payer: Self-pay

## 2018-11-16 DIAGNOSIS — E7849 Other hyperlipidemia: Secondary | ICD-10-CM | POA: Diagnosis not present

## 2018-11-16 DIAGNOSIS — Z6841 Body Mass Index (BMI) 40.0 and over, adult: Secondary | ICD-10-CM

## 2018-11-16 DIAGNOSIS — I5022 Chronic systolic (congestive) heart failure: Secondary | ICD-10-CM

## 2018-11-16 DIAGNOSIS — Z95810 Presence of automatic (implantable) cardiac defibrillator: Secondary | ICD-10-CM

## 2018-11-16 NOTE — Progress Notes (Signed)
EPIC Encounter for ICM Monitoring  Patient Name: [name] is a 48 y.o. female Date: 11/16/2018 Primary Care Physican: [name], MD Primary Cardiologist:McLean Electrophysiologist:Klein LHTDSK:876 lbs (4/7 discharge weight) 11/09/2018 Weight: 232 lbs 11/16/2018 Weight: 230.5 lbs - 232 lbs  Transmission reviewed.  Spoke with patient.  Weight is at baseline and she feels fine.  She self adjusted Torsemide and took extra x 1 week.   Optivol Thoracic impedance returned to normalafter taking extra Torsemide  Taking: Torsemide20 mg take 4 tablets (80 mg total) daily.               Potassium 20 mEq take 3 tablets (60 mEq total) twice a day.               Spironolactone 25 mg take 1 tablet daily.                Metolazone 2.5 mg Take 2.5 mg by mouth daily as needed (for fluid).   Labs: 10/12/2018 Creatinine 1.15, BUN 11, Potassium 3.6, Sodium 136, GFR 57->60 09/30/2018 Creatinine1.23, BUN20, Potassium3.9, Sodium139, GFR52->60 09/28/2018 Creatinine1.04, BUN22, Potassium3.1, Sodium136, GFR>60  09/27/2018 Potassium3.8 (3:18 PM)  09/27/2018 Creatinine1.43, BUN28, Potassium2.5, Sodium130, GFR44-50 (12:24 AM)  09/22/2018 Creatinine1.37, BUN17, Potassium4.0, [id number], [id number]  08/24/2018 Creatinine1.05, BUN13, Potassium3.9, Sodium139, GFR>60  08/10/2018 Creatinine0.98,BUN8, Potassium 3.0, Sodium138, GFR>60 A complete set of results can be found in Results Review.  Recommendations: Advised to inform Dr [name] at 6/2 OV that she took extra Torsemide x 1 week so he can evaluate the labs that will drawn at the 6/2 appt.  Follow-up plan: ICM clinic phone appointment on6/18/2020.Office appt6/07/2018 with Dr.McLean.     Copy of ICM check sent to Dr. [name].   3 month ICM trend: 11/16/2018    1 Year ICM trend:       [name], RN 11/16/2018 2:49 PM

## 2018-11-17 NOTE — Progress Notes (Signed)
Office: [phone]  /  Fax: [phone] TeleHealth Visit:  VEDA ARRELLANO has verbally consented to this TeleHealth visit today. The patient is located at home, the provider is located at the [facility] office. The participants in this visit include the listed provider and patient. The visit was conducted today via Webex.  HPI:   Chief Complaint: OBESITY Kameo is here to discuss her progress with her obesity treatment plan. She is on the Category 3 plan and is following her eating plan approximately 80% of the time. She states she is walking 15 minutes 3 times per week. Daionna reports that she is now journaling her food daily and is not always meeting her protein goal. She states that she is retaining some fluid due to her heart failure. She is seeing her cardiologist in the next 2 weeks. We were unable to weigh the patient today for this TeleHealth visit. She reports her weight to be 234 lbs. She has lost 28 lbs since starting treatment with [name].  Hyperlipidemia Kissy has hyperlipidemia and has been trying to improve her cholesterol levels with intensive lifestyle modification including a low saturated fat diet, exercise and weight loss. She is on atorvastatin and denies any chest pain.  ASSESSMENT AND PLAN:  Other hyperlipidemia  Class 3 severe obesity with serious comorbidity and body mass index (BMI) of 40.0 to 44.9 in adult, unspecified obesity type ([place])  PLAN:  Hyperlipidemia Karena was informed of the American Heart Association Guidelines emphasizing intensive lifestyle modifications as the first line treatment for hyperlipidemia. We discussed many lifestyle modifications today in depth, and Kaimana will continue to work on decreasing saturated fats such as fatty red meat, butter and many fried foods. She will continue atorvastatin, increase vegetables and lean protein in her diet, and continue to work on exercise and weight loss efforts.  Obesity Kindra is currently  in the action stage of change. As such, her goal is to continue with weight loss efforts. She has agreed to keep a food journal with 1500 calories and 95 grams of protein.  Consandra has been instructed to work up to a goal of 150 minutes of combined cardio and strengthening exercise per week for weight loss and overall health benefits. We discussed the following Behavioral Modification Strategies today: increasing lean protein intake, work on meal planning and easy cooking plans.  Jamesina has agreed to follow-up with our clinic in 2 weeks. She was informed of the importance of frequent follow-up visits to maximize her success with intensive lifestyle modifications for her multiple health conditions.  ALLERGIES: No Known Allergies  MEDICATIONS: Current Outpatient Medications on File Prior to Visit  Medication Sig Dispense Refill  . amiodarone (PACERONE) 200 MG tablet Take 200 mg by mouth daily.    Marland Kitchen atorvastatin (LIPITOR) 40 MG tablet TAKE 1 TABLET BY MOUTH ONCE DAILY (Patient taking differently: Take 40 mg by mouth daily at 6 PM. ) 30 tablet 3  . carvedilol (COREG) 25 MG tablet TAKE 1 TABLET BY MOUTH TWICE A DAY WITH A MEAL (Patient taking differently: Take 25 mg by mouth 2 (two) times daily with a meal. ) 60 tablet 6  . digoxin (LANOXIN) 0.125 MG tablet TAKE 1 TABLET BY MOUTH ONCE DAILY (Patient taking differently: Take 0.125 mg by mouth daily. ) 30 tablet 3  . ENTRESTO 24-26 MG TAKE 1 TABLET BY MOUTH TWICE A DAY (Patient taking differently: Take 1 tablet by mouth 2 (two) times daily. ) 60 tablet 5  .  ivabradine (CORLANOR) 5 MG TABS tablet Take 1 tablet (5 mg total) by mouth 2 (two) times daily with a meal. 60 tablet 6  . levothyroxine (SYNTHROID) 25 MCG tablet Take 1 tablet (25 mcg total) by mouth daily before breakfast. 90 tablet 3  . metFORMIN (GLUCOPHAGE) 500 MG tablet Take 1 tablet (500 mg total) by mouth daily with breakfast. 30 tablet 0  . metolazone (ZAROXOLYN) 2.5 MG tablet Take 1 tablet  (2.5 mg total) by mouth daily. (Patient taking differently: Take 2.5 mg by mouth daily as needed (for fluid). ) 5 tablet 0  . potassium chloride SA (K-DUR,KLOR-CON) 20 MEQ tablet Take 3 tablets (60 mEq total) by mouth 2 (two) times daily. 180 tablet 5  . spironolactone (ALDACTONE) 25 MG tablet Take 1 tablet (25 mg total) by mouth at bedtime. 30 tablet 6  . torsemide (DEMADEX) 20 MG tablet Take 4 tablets (80 mg total) by mouth daily. 120 tablet 5  . Vitamin D, Ergocalciferol, (DRISDOL) 1.25 MG (50000 UT) CAPS capsule Take 1 capsule (50,000 Units total) by mouth every 7 (seven) days. 4 capsule 0   No current facility-administered medications on file prior to visit.     PAST MEDICAL HISTORY: Past Medical History:  Diagnosis Date  . Automatic implantable cardioverter-defibrillator in situ   . Chronic CHF (congestive heart failure) (HCC)    a. EF 15-20% b. RHC (09/2013) RA 14, RV 57/22, PA 64/36 (48), PCWP 18, FIck CO/CI 3.7/1.6, PVR 8.1 WU, PA sat 47%   . History of stomach ulcers   . Hypertension   . Hypotension   . Hypothyroidism   . Morbid obesity ([place])   . Myocardial infarction ([place]) 08/2013  . Nocturnal dyspnea   . Nonischemic cardiomyopathy ([place])   . Sinus tachycardia   . Snoring-prob OSA 09/04/2011  . Sprint Fidelis ICD lead RECALL  [id number]     PAST SURGICAL HISTORY: Past Surgical History:  Procedure Laterality Date  . BREATH TEK H PYLORI N/A 11/09/2014   Procedure: BREATH TEK H PYLORI;  Surgeon: Greer Pickerel, MD;  Location: [facility];  Service: General;  Laterality: N/A;  . CARDIAC CATHETERIZATION  ~ 2006; 09/2013  . CARDIAC CATHETERIZATION N/A 05/23/2016   Procedure: Right Heart Cath;  Surgeon: Larey Dresser, MD;  Location: [facility];  Service: Cardiovascular;  Laterality: N/A;  . CARDIAC DEFIBRILLATOR PLACEMENT  2006; 12/26/2013   Medtronic [id number]; 12-2013 ICD gen change and RV lead revision with new 4098 RV lead by Dr [name]  . CESAREAN SECTION  1999  .  IMPLANTABLE CARDIOVERTER DEFIBRILLATOR GENERATOR CHANGE N/A 12/26/2013   Procedure: IMPLANTABLE CARDIOVERTER DEFIBRILLATOR GENERATOR CHANGE;  Surgeon: Deboraha Sprang, MD;  Location: [facility] CATH LAB;  Service: Cardiovascular;  Laterality: N/A;  . LEAD REVISION N/A 12/26/2013   Procedure: LEAD REVISION;  Surgeon: Deboraha Sprang, MD;  Location: [facility] CATH LAB;  Service: Cardiovascular;  Laterality: N/A;  . RIGHT HEART CATHETERIZATION N/A 02/15/2014   Procedure: RIGHT HEART CATH;  Surgeon: Larey Dresser, MD;  Location: [facility] CATH LAB;  Service: Cardiovascular;  Laterality: N/A;  . TUBAL LIGATION  1999    SOCIAL HISTORY: Social History   Tobacco Use  . Smoking status: Never Smoker  . Smokeless tobacco: Never Used  Substance Use Topics  . Alcohol use: No  . Drug use: No    FAMILY HISTORY: Family History  Problem Relation Age of Onset  . Heart disease Maternal Grandmother   . Heart disease Mother   . Obesity  Mother   . High blood pressure Father   . Stroke Father    ROS: Review of Systems  Cardiovascular: Negative for chest pain.   PHYSICAL EXAM: Pt in no acute distress  RECENT LABS AND TESTS: BMET    Component Value Date/Time   NA 136 10/12/2018 0916   NA 137 03/10/2018 1507   NA 138 09/24/2013 0536   K 3.6 10/12/2018 0916   K 3.7 09/24/2013 0536   CL 101 10/12/2018 0916   CL 107 09/24/2013 0536   CO2 24 10/12/2018 0916   CO2 25 09/24/2013 0536   GLUCOSE 108 (H) 10/12/2018 0916   GLUCOSE 82 09/24/2013 0536   BUN 11 10/12/2018 0916   BUN 10 03/10/2018 1507   BUN 11 09/24/2013 0536   CREATININE 1.15 (H) 10/12/2018 0916   CREATININE 1.08 09/24/2013 0536   CALCIUM 9.0 10/12/2018 0916   CALCIUM 8.6 09/24/2013 0536   GFRNONAA 57 (L) 10/12/2018 0916   GFRNONAA >60 09/24/2013 0536   GFRAA >60 10/12/2018 0916   GFRAA >60 09/24/2013 0536   Lab Results  Component Value Date   HGBA1C 5.4 08/17/2018   HGBA1C 5.4 03/10/2018   HGBA1C 5.6 10/05/2017   HGBA1C 5.9 09/19/2013   Lab  Results  Component Value Date   INSULIN 15.6 08/17/2018   INSULIN 7.9 03/10/2018   INSULIN 22.5 10/05/2017   CBC    Component Value Date/Time   WBC 12.8 (H) 09/27/2018 0024   RBC 5.44 (H) 09/27/2018 0024   HGB 13.4 09/27/2018 0024   HGB 13.6 10/05/2017 1218   HCT 44.5 09/27/2018 0024   HCT 41.7 10/05/2017 1218   PLT 343 09/27/2018 0024   PLT 195 09/23/2013 0443   MCV 81.8 09/27/2018 0024   MCV 86 10/05/2017 1218   MCV 85 09/23/2013 0443   MCH 24.6 (L) 09/27/2018 0024   MCHC 30.1 09/27/2018 0024   RDW 14.6 09/27/2018 0024   RDW 14.5 10/05/2017 1218   RDW 15.4 (H) 09/23/2013 0443   LYMPHSABS 3.3 09/27/2018 0024   LYMPHSABS 1.9 10/05/2017 1218   LYMPHSABS 2.0 09/23/2013 0443   MONOABS 1.4 (H) 09/27/2018 0024   MONOABS 0.4 09/23/2013 0443   EOSABS 0.1 09/27/2018 0024   EOSABS 0.2 10/05/2017 1218   EOSABS 0.1 09/23/2013 0443   BASOSABS 0.1 09/27/2018 0024   BASOSABS 0.0 10/05/2017 1218   BASOSABS 0.1 09/23/2013 0443   Iron/TIBC/Ferritin/ %Sat No results found for: IRON, TIBC, FERRITIN, IRONPCTSAT Lipid Panel     Component Value Date/Time   CHOL 171 08/17/2018 1147   CHOL 183 09/20/2013 0424   TRIG 111 08/17/2018 1147   TRIG 105 09/20/2013 0424   HDL 46 08/17/2018 1147   HDL 40 09/20/2013 0424   CHOLHDL 3.1 03/10/2018 1507   CHOLHDL 3.3 07/21/2017 0854   VLDL 18 07/21/2017 0854   VLDL 21 09/20/2013 0424   LDLCALC 103 (H) 08/17/2018 1147   LDLCALC 122 (H) 09/20/2013 0424   Hepatic Function Panel     Component Value Date/Time   PROT 8.2 (H) 09/27/2018 0024   PROT 7.8 03/10/2018 1507   PROT 7.7 09/19/2013 0431   ALBUMIN 4.0 09/27/2018 0024   ALBUMIN 4.5 03/10/2018 1507   ALBUMIN 3.5 09/19/2013 0431   AST 19 09/27/2018 0024   AST 36 09/19/2013 0431   ALT 19 09/27/2018 0024   ALT 32 09/19/2013 0431   ALKPHOS 70 09/27/2018 0024   ALKPHOS 65 09/19/2013 0431   BILITOT 0.8 09/27/2018 0024   BILITOT 0.4  03/10/2018 1507   BILITOT 0.4 09/19/2013 0431   BILIDIR  <0.1 (L) 07/21/2017 0854   IBILI NOT CALCULATED 07/21/2017 0854      Component Value Date/Time   TSH 8.532 (H) 09/27/2018 0159   TSH 12.027 (H) 09/22/2018 1047   TSH 3.680 10/05/2017 1218   TSH 5.995 (H) 11/14/2016 1354   TSH 2.90 02/29/2008 1132   Results for ELLIANNAH, WAYMENT (MRN [id number]) as of 11/17/2018 08:49  Ref. Range 08/17/2018 11:47  Vitamin D, 25-Hydroxy Latest Ref Range: 30.0 - 100.0 ng/mL 29.2 (L)    I, Michaelene Song, am acting as Location manager for [employer], PA-C I, Abby Potash, PA-C have reviewed above note and agree with its content

## 2018-11-23 ENCOUNTER — Encounter (HOSPITAL_COMMUNITY): Payer: Self-pay | Admitting: *Deleted

## 2018-11-23 ENCOUNTER — Other Ambulatory Visit: Payer: Self-pay

## 2018-11-23 ENCOUNTER — Ambulatory Visit (HOSPITAL_COMMUNITY)
Admission: RE | Admit: 2018-11-23 | Discharge: 2018-11-23 | Disposition: A | Discharge status: Home / Self Care | Payer: BLUE CROSS/BLUE SHIELD | Source: Ambulatory Visit | Attending: Cardiology | Admitting: Cardiology

## 2018-11-23 DIAGNOSIS — I5022 Chronic systolic (congestive) heart failure: Secondary | ICD-10-CM

## 2018-11-23 MED ORDER — IVABRADINE HCL 5 MG PO TABS: 5.0000 mg | ORAL_TABLET | Freq: Two times a day (BID) | ORAL | 6 refills | Status: DC

## 2018-11-23 MED ORDER — POTASSIUM CHLORIDE CRYS ER 20 MEQ PO TBCR: EXTENDED_RELEASE_TABLET | ORAL | 5 refills | Status: DC

## 2018-11-23 MED ORDER — TORSEMIDE 20 MG PO TABS: ORAL_TABLET | ORAL | 5 refills | Status: DC

## 2018-11-23 NOTE — Patient Instructions (Signed)
Increase Torsemide to 80 mg (4 tabs) every AM and 40 mg (2 tabs) every other afternoon  Increase Potassium to 80 meq (4 tabs) every AM and 60 meq (3 tabs) every PM  Start Corlanor 5 mg Twice daily, new prescription for this has been sent to [facility] and we have gotten it approved with [place] physician recommends that you return for lab work in: 1 week (Tue 6/9 at 10:00 AM) parking code: [employer] has recommended that you have a cardiopulmonary stress test (CPX). CPX testing is a non-invasive measurement of heart and lung function. It replaces a traditional treadmill stress test. This type of test provides a tremendous amount of information that relates not only to your present condition but also for future outcomes. This test combines measurements of you ventilation, respiratory gas exchange in the lungs, electrocardiogram (EKG), blood pressure and physical response before, during, and following an exercise protocol.  WE WILL CALL YOU TO SCHEDULE THIS ONCE WE BEGIN PERFORMING THESE TEST AGAIN  Your physician recommends that you schedule a follow-up appointment in: 6 weeks, OUR OFFICE WILL CALL YOU TO SCHEDULE THIS  If you have any questions or concerns before your next appointment please send [name] a message through [facility] or call our office at [phone].

## 2018-11-23 NOTE — Progress Notes (Signed)
Heart Failure TeleHealth Note  Due to national recommendations of social distancing due to [name], Audio/video telehealth visit is felt to be most appropriate for this patient at this time.  See MyChart message from today for patient consent regarding telehealth for [facility].  Date:  10/05/2018   ID:  [name], DOB 1971/03/20, MRN [id number]  Location: Home  Provider location: [facility] Type of Visit: Established patient   PCP:  [name], MD    Cardiologist:  [name], MD  Chief Complaint: Exertional dyspnea.   History of Present Illness: [name] is a 48 y.o. female who presents via audio/video conferencing for a telehealth visit today.     she denies symptoms worrisome for COVID 19.   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  She was followed for systolic CHF initially in [place]. Apparently her EF recovered to 35-40%. However, she was admitted to [facility] 09/19/13 for worsening dyspnea and CP. Coronaries were reportedly OK on LHC at that time at [facility]. Echo showed EF of 15-20% with four-chamber enlargement with moderate-severe TR/MR. RHC as below. Rheumatological w/u negative.  CPX in 9/16 showed near-normal functional capacity.  Repeat echo in 6/17 shows that EF remains 20%. Repeat CPX in 1/18 was submaximal but probably only mild circulatory limitation.  Echo 8/18 showed EF 25-30%, severe LV dilation.  CPX in 6/19 showed low normal functional capacity.  Echo in 10/19 showed EF 20-25%, moderate LV dilation.   Zio patch in 2/20 with 11% PVCs and NSVT, amiodarone started. Zio patch in 3/20 with 13.3% PVCs and NSVT.  She saw Dr. [name], he thought that increased PVCs were electromechanical in nature due to worsening heart failure.   She was admitted in 4/20 with VT in setting of hypokalemia.    She has been doing ok  recently.  She took an extra 40 mg of torsemide in the afternoon for about a week a couple of weeks ago, and thoracic impedance had returned to normal on 11/16/18 device interrogation.  Her BP generally runs low, but she has had no significant lightheadedness. No significant exertional dyspnea, no problems walking on flat ground.  No orthopnea/PND.    RHC Procedural Findings: RA mean 5 RV 55/8 PA 56/31, mean 40 PCWP mean 24 Oxygen saturations: PA 61% AO 99% Cardiac Output (Fick) 4.3  Cardiac Index (Fick) 2.0 PVR 3.7 WU  RHC 09/24/13  RA 14  RV 57/22  PA 64/36 (48)  PCWP 18  Fick CO/CI: 3.7/1.6  PVR 8.1 WU  Ao sat 95%  PA sat 47%   CPX 06/27/2016  Peak VO2: 13.5 (56.2% predicted peak VO2) VE/VCO2 slope: 33 OUES: 2.16 Peak RER: 0.98  CPX  1/18 Peak VO2 13.5 VE/VCO2 slope 33 RER 0.98 Submaximal study, mild circulatory limitation, suspect more limited by body habitus.   Echo (8/15) with EF 20%, moderate LV dilation, diffuse hypokinesis, normal RV size with mildly decreased systolic function, mild MR.  Echo (5/16) with EF 20%, spherical LV with diffuse hypokinesis, mild MR.  Echo (6/17) with EF 20%, diffuse hypokinesis, moderate LV dilation, normal RV, mild to moderate MR.  Echo (8/18) with EF 25-30%, severe LV dilation, severe LAE.  Echo (2/20) with EF 20-25%, moderate LV dilation, moderate MR.   Sleep study did not show significant OSA.   RHC (8/15): Mean RA 3 PA 33/11 mean 19  Mean PCWP 8 CI 2.35  Holter (8/15) with < 1% PVCs  CPX 9/16 Peak VO2 16.2 VE/VCO2 slope 23.6 RER 1.25 Normal functional capacity  CPX (6/19): peak VO2 14.3 (83% predicted), VE/VCO2 slope 30 => low normal functional capacity.   Labs: (11/09/13): Dig level 0.5, K 3.6, Creatinine 0.83, pro-BNP 622 (12/19/13): K 3.6, creatinine 0.8 (7/15): K 4.1, creatinine 0.91 (8/15): K 3.9, creatinine 0.91 (10/15): K 4, creatinine 0.93, proBNP 129 (12/15): K 4, creatinine 0.9, digoxin 0.4  (5/16): K 4, creatinine 0.86 (9/16): K 3.6, creatinine 0.86, digoxin < 0.2 (5/17): K 4.1, creatinine 0.95, digoxin 0.3 (5/18): K 3.4, creatinine 0.95, TSH elevated but free T3 and free T4 normal.  (8/18): K 3.9, creatinine 0.87, BNP 108  (11/18): K 3.5, creatinine 1.04, digoxin 0.4 (1/19): LDL 91, HDL 47 (3/19): digoxin 0.7, K 4, creatinine 0.99 (4/19): TSH normal, LDL 95 (9/19): LDL 99, HDL 45, K 4, creatinine 0.96 (10/19): K 3.6, creatinine 0.85, digoxin 0.5 (2/20): LDL 103 (3/20): K 3.9, creatinine 1.05 (4/20): K 3.9, creatinine 1.23 => 1.15, TSH 8, digoxin 0.3, LFTs normal  Current Outpatient Medications  Medication Sig Dispense Refill  . amiodarone (PACERONE) 200 MG tablet Take 200 mg by mouth daily.    Marland Kitchen atorvastatin (LIPITOR) 40 MG tablet TAKE 1 TABLET BY MOUTH ONCE DAILY (Patient taking differently: Take 40 mg by mouth daily at 6 PM. ) 30 tablet 3  . carvedilol (COREG) 25 MG tablet TAKE 1 TABLET BY MOUTH TWICE A DAY WITH A MEAL (Patient taking differently: Take 25 mg by mouth 2 (two) times daily with a meal. ) 60 tablet 6  . digoxin (LANOXIN) 0.125 MG tablet TAKE 1 TABLET BY MOUTH ONCE DAILY (Patient taking differently: Take 0.125 mg by mouth daily. ) 30 tablet 3  . ENTRESTO 24-26 MG TAKE 1 TABLET BY MOUTH TWICE A DAY (Patient taking differently: Take 1 tablet by mouth 2 (two) times daily. ) 60 tablet 5  . ivabradine (CORLANOR) 5 MG TABS tablet Take 1 tablet (5 mg total) by mouth 2 (two) times daily with a meal. 60 tablet 6  . levothyroxine (SYNTHROID) 25 MCG tablet Take 1 tablet (25 mcg total) by mouth daily before breakfast. 90 tablet 3  . metFORMIN (GLUCOPHAGE) 500 MG tablet Take 1 tablet (500 mg total) by mouth daily with breakfast. 30 tablet 0  . metolazone (ZAROXOLYN) 2.5 MG tablet Take 1 tablet (2.5 mg total) by mouth daily. (Patient taking differently: Take 2.5 mg by mouth daily as needed (for fluid). ) 5 tablet 0  . potassium chloride SA (K-DUR) 20 MEQ tablet Take 4  tablets (80 mEq total) by mouth every morning AND 3 tablets (60 mEq total) every evening. 180 tablet 5  . spironolactone (ALDACTONE) 25 MG tablet Take 1 tablet (25 mg total) by mouth at bedtime. 30 tablet 6  . torsemide (DEMADEX) 20 MG tablet Take 4 tabs every AM and 2 tabs in PM every other day 120 tablet 5  . Vitamin D, Ergocalciferol, (DRISDOL) 1.25 MG (50000 UT) CAPS capsule Take 1 capsule (50,000 Units total) by mouth every 7 (seven) days. 4 capsule 0   No current facility-administered medications for this encounter.     Allergies:   Patient has no known allergies.   Social History:  The patient  reports that she has never smoked. She has never used smokeless tobacco. She reports that she does not drink alcohol or use drugs.   Family History:  The patient's family history includes  Heart disease in her maternal grandmother and mother; High blood pressure in her father; Obesity in her mother; Stroke in her father.   ROS:  Please see the history of present illness.   All other systems are personally reviewed and negative.   Exam:  (Video/Tele Health Call; Exam is subjective and or/visual.) General: Speaks in full sentences. No resp difficulty. Neck: No JVD Lungs: Normal respiratory effort with conversation.  Abdomen: Non-distended per patient report Extremities: Pt denies edema. Neuro: Alert & oriented x 3.    Recent Labs: 09/27/2018: ALT 19; B Natriuretic Peptide 404.7; Hemoglobin 13.4; Platelets 343; TSH 8.532 10/12/2018: BUN 11; Creatinine, Ser 1.15; Magnesium 1.9; Potassium 3.6; Sodium 136  Personally reviewed   Wt Readings from Last 3 Encounters:  09/28/18 107.9 kg (237 lb 14.4 oz)  09/22/18 107.4 kg (236 lb 12.8 oz)  09/22/18 112.7 kg (248 lb 6.4 oz)      ASSESSMENT AND PLAN:  1. Chronic systolic HF: Nonischemic cardiomyopathy.  She has a Medtronic ICD.  Cause for cardiomyopathy is uncertain: initially noted peri-partum (after son's birth), so cannot rule out peri-partum  CMP.  On and off, she has had frequent PVCs.   RHC in 8/15 showed normal filling pressures and preserved cardiac output.  She does not qualify for CRT with narrow QRS.  CPX 6/19 with low normal functional capacity.  Last echo in 10/19 showed EF 20-25% with moderate LV dilation and moderate MR. After recent increase in torsemide, most recent device check showed normal thoracic impedance.   BP continues to run on the low side but no lightheadedness.  - Increase torsemide to 80 mg qam, 40 mg every other afternoon.  Increase KCl to 80 qam/60 qpm. BMET in 1 week.  - Continue Entresto 24/26 mg BID, no BP room to increase.    - Continue Coreg 25 mg bid and ivabradine 5 mg bid (need to refill ivabradine, she is out).   - Continue spironolactone 25 mg daily and digoxin, check digoxin level with next labs.  - I will arrange for CPX.  I am concerned for worsening of her HF with ventricular arrhythmias and volume overload.  2. Pulmonary HTN: Mixed picture with PVR 8.1 on RHC at [facility].  However, repeat study in 8/15 here showed no significant pulmonary hypertension.  3. Morbid obesity: Continue to work on weight loss.   4. Hyperlipidemia: Good lipids in 9/19.   5. PVCs: Worsened with 11% PVCs + NSVT on 2/20 Zio patch.  Amiodarone started, but PVCs up to 13.3% with NSVT on 3/20 Zio patch. She saw Dr. [name] who thought that the ventricular arrhythmias were electromechanical due to worsening HF.  In 4/20, she had VT x 2 with ICD discharges in the setting of hypokalemia.  - Continue amiodarone, check LFTs with next labs.  She will need a regular eye exam with amiodarone use.  6. Hypothyroidism: She is on levothyroxine now.   COVID screen The patient does not have any symptoms that suggest any further testing/ screening at this time.  Social distancing reinforced today.  Relevant cardiac medications were reviewed at length with the patient today.   The patient does not have concerns regarding their medications at this  time.   Recommended follow-up:  6 wks  Today, I have spent 21 minutes with the patient with telehealth technology discussing the above issues .    Signed, [name], MD  11/23/2018  [facility]  [place] [address] [phone] (office) [phone] (fax)

## 2018-11-23 NOTE — Progress Notes (Signed)
Larey Dresser, MD  Scarlette Calico, RN        1. Arrange for CPX.  2. CMET, digoxin level => draw next week.  3. Increase torsemide to 80 mg qam, and 40 mg every other afternoon.  4. Increase KCl to 80 qam/60 qpm.  5. Followup in office 6 wks.    Larey Dresser, MD  Scarlette Calico, RN        Also have her restart Corlanor 5 mg bid.    Spoke w/pt, she is aware, agreeable and verbalizes understanding of all instructions.  She states she has plenty of Torsemide and KCL and doesn't want rx sent in at this time.  Completed PA for Corlanor on CMM, med approved through 11/22/2018, rx sent in.  Lab work sch for 6/9, message to scheduler about cpx and f/u appt.  AVS sent via mychart.  All orders placed and med list updated.

## 2018-11-30 ENCOUNTER — Ambulatory Visit (HOSPITAL_COMMUNITY)
Admission: RE | Admit: 2018-11-30 | Discharge: 2018-11-30 | Disposition: A | Discharge status: Home / Self Care | Payer: BC Managed Care – PPO | Source: Ambulatory Visit | Attending: Cardiology | Admitting: Cardiology

## 2018-11-30 ENCOUNTER — Encounter (INDEPENDENT_AMBULATORY_CARE_PROVIDER_SITE_OTHER): Payer: Self-pay | Admitting: Physician Assistant

## 2018-11-30 ENCOUNTER — Ambulatory Visit (INDEPENDENT_AMBULATORY_CARE_PROVIDER_SITE_OTHER): Payer: BLUE CROSS/BLUE SHIELD | Admitting: Physician Assistant

## 2018-11-30 ENCOUNTER — Other Ambulatory Visit: Payer: Self-pay

## 2018-11-30 DIAGNOSIS — Z6841 Body Mass Index (BMI) 40.0 and over, adult: Secondary | ICD-10-CM | POA: Diagnosis not present

## 2018-11-30 DIAGNOSIS — E8881 Metabolic syndrome: Secondary | ICD-10-CM | POA: Diagnosis not present

## 2018-11-30 DIAGNOSIS — E559 Vitamin D deficiency, unspecified: Secondary | ICD-10-CM

## 2018-11-30 DIAGNOSIS — I5022 Chronic systolic (congestive) heart failure: Secondary | ICD-10-CM | POA: Diagnosis not present

## 2018-11-30 LAB — COMPREHENSIVE METABOLIC PANEL
ALT: 11 U/L (ref 0–44)
AST: 17 U/L (ref 15–41)
Albumin: 3.7 g/dL (ref 3.5–5.0)
Alkaline Phosphatase: 71 U/L (ref 38–126)
Anion gap: 9 (ref 5–15)
BUN: 13 mg/dL (ref 6–20)
CO2: 22 mmol/L (ref 22–32)
Calcium: 9.6 mg/dL (ref 8.9–10.3)
Chloride: 108 mmol/L (ref 98–111)
Creatinine, Ser: 1.07 mg/dL — ABNORMAL HIGH (ref 0.44–1.00)
GFR calc Af Amer: >60 mL/min (ref >60)
GFR calc non Af Amer: >60 mL/min (ref >60)
Glucose, Bld: 62 mg/dL — ABNORMAL LOW (ref 70–99)
Potassium: 4.3 mmol/L (ref 3.5–5.1)
Sodium: 139 mmol/L (ref 135–145)
Total Bilirubin: 0.4 mg/dL (ref 0.3–1.2)
Total Protein: 7.7 g/dL (ref 6.5–8.1)

## 2018-11-30 LAB — DIGOXIN LEVEL: Digoxin Level: 0.5 ng/mL — ABNORMAL LOW (ref 0.8–2.0)

## 2018-11-30 MED ORDER — METFORMIN HCL 500 MG PO TABS: 500.0000 mg | ORAL_TABLET | Freq: Every day | ORAL | 1 refills | Status: DC

## 2018-11-30 MED ORDER — VITAMIN D (ERGOCALCIFEROL) 1.25 MG (50000 UNIT) PO CAPS: 50000.0000 [IU] | ORAL_CAPSULE | ORAL | 0 refills | Status: DC

## 2018-11-30 NOTE — Progress Notes (Signed)
Office: [phone]  /  Fax: [phone] TeleHealth Visit:  [name] has verbally consented to this TeleHealth visit today. The patient is located at home, the provider is located at the [employer] [facility] office. The participants in this visit include the listed provider and patient. The visit was conducted today via webex.  HPI:   Chief Complaint: OBESITY [name] is here to discuss her progress with her obesity treatment plan. She is on the keep a food journal with 1500 calories and 95 grams of protein daily and is following her eating plan approximately 80 % of the time. She states she is walking for 30 minutes 3 times per week. [name] reports that she has had some issues with fluid retention, secondary to her congestive heart failure. She is seeing her Cardiologist today for a follow up. Her appetite decreases when she retains fluid and she does not get all of her food in. She states her weight is 235 lbs today. We were unable to weigh the patient today for this TeleHealth visit. She feels as if she has gained 1 lb since her last visit. She has lost 28 lbs since starting treatment with [name].  Vitamin D Deficiency [name] has a diagnosis of vitamin D deficiency. She is currently taking prescription Vit D and denies nausea, vomiting or muscle weakness.  Insulin Resistance [name] has a diagnosis of insulin resistance based on her elevated fasting insulin level >5. Although [name]'s blood glucose readings are still under good control, insulin resistance puts her at greater risk of metabolic syndrome and diabetes. She is taking metformin currently and denies nausea, vomiting, or diarrhea. She continues to work on diet and exercise to decrease risk of diabetes.  ASSESSMENT AND PLAN:  Vitamin D deficiency - Plan: Vitamin D, Ergocalciferol, (DRISDOL) 1.25 MG (50000 UT) CAPS capsule  Insulin resistance - Plan: metFORMIN (GLUCOPHAGE) 500 MG tablet  Class 3 severe obesity with serious  comorbidity and body mass index (BMI) of 40.0 to 44.9 in adult, unspecified obesity type ([name])  PLAN:  Vitamin D Deficiency [name] was informed that low vitamin D levels contributes to fatigue and are associated with obesity, breast, and colon cancer. [name] agrees to continue taking prescription Vit D @50 ,000 IU every week #4 and we will refill for 1 month. She will follow up for routine testing of vitamin D, at least 2-3 times per year. She was informed of the risk of over-replacement of vitamin D and agrees to not increase her dose unless she discusses this with [name] first. [name] agrees to follow up with our clinic in 3 weeks.  Insulin Resistance [name] will continue to work on weight loss, exercise, and decreasing simple carbohydrates in her diet to help decrease the risk of diabetes. We dicussed metformin including benefits and risks. She was informed that eating too many simple carbohydrates or too many calories at one sitting increases the likelihood of GI side effects. [name] agrees to continue taking metformin 500 mg q AM #30 and we will refill for 1 month. [name] agrees to follow up with our clinic in 3 weeks as directed to monitor her progress.  Obesity [name] is currently in the action stage of change. As such, her goal is to continue with weight loss efforts She has agreed to follow the Category 3 plan [name] has been instructed to work up to a goal of 150 minutes of combined cardio and strengthening exercise per week for weight loss and overall health benefits. We discussed the following  Behavioral Modification Stratagies today: work on meal planning and easy cooking plans and no skipping meals   [name] has agreed to follow up with our clinic in 3 weeks. She was informed of the importance of frequent follow up visits to maximize her success with intensive lifestyle modifications for her multiple health conditions.  ALLERGIES: No Known Allergies  MEDICATIONS: Current Outpatient Medications  on File Prior to Visit  Medication Sig Dispense Refill  . amiodarone (PACERONE) 200 MG tablet Take 200 mg by mouth daily.    Marland Kitchen atorvastatin (LIPITOR) 40 MG tablet TAKE 1 TABLET BY MOUTH ONCE DAILY (Patient taking differently: Take 40 mg by mouth daily at 6 PM. ) 30 tablet 3  . carvedilol (COREG) 25 MG tablet TAKE 1 TABLET BY MOUTH TWICE A DAY WITH A MEAL (Patient taking differently: Take 25 mg by mouth 2 (two) times daily with a meal. ) 60 tablet 6  . digoxin (LANOXIN) 0.125 MG tablet TAKE 1 TABLET BY MOUTH ONCE DAILY (Patient taking differently: Take 0.125 mg by mouth daily. ) 30 tablet 3  . ENTRESTO 24-26 MG TAKE 1 TABLET BY MOUTH TWICE A DAY (Patient taking differently: Take 1 tablet by mouth 2 (two) times daily. ) 60 tablet 5  . ivabradine (CORLANOR) 5 MG TABS tablet Take 1 tablet (5 mg total) by mouth 2 (two) times daily with a meal. 60 tablet 6  . levothyroxine (SYNTHROID) 25 MCG tablet Take 1 tablet (25 mcg total) by mouth daily before breakfast. 90 tablet 3  . metolazone (ZAROXOLYN) 2.5 MG tablet Take 1 tablet (2.5 mg total) by mouth daily. (Patient taking differently: Take 2.5 mg by mouth daily as needed (for fluid). ) 5 tablet 0  . potassium chloride SA (K-DUR) 20 MEQ tablet Take 4 tablets (80 mEq total) by mouth every morning AND 3 tablets (60 mEq total) every evening. 180 tablet 5  . spironolactone (ALDACTONE) 25 MG tablet Take 1 tablet (25 mg total) by mouth at bedtime. 30 tablet 6  . torsemide (DEMADEX) 20 MG tablet Take 4 tabs every AM and 2 tabs in PM every other day 120 tablet 5   No current facility-administered medications on file prior to visit.     PAST MEDICAL HISTORY: Past Medical History:  Diagnosis Date  . Automatic implantable cardioverter-defibrillator in situ   . Chronic CHF (congestive heart failure) (HCC)    a. EF 15-20% b. RHC (09/2013) RA 14, RV 57/22, PA 64/36 (48), PCWP 18, FIck CO/CI 3.7/1.6, PVR 8.1 WU, PA sat 47%   . History of stomach ulcers   .  Hypertension   . Hypotension   . Hypothyroidism   . Morbid obesity ([place])   . Myocardial infarction ([place]) 08/2013  . Nocturnal dyspnea   . Nonischemic cardiomyopathy ([place])   . Sinus tachycardia   . Snoring-prob OSA 09/04/2011  . Sprint Fidelis ICD lead RECALL  [id number]     PAST SURGICAL HISTORY: Past Surgical History:  Procedure Laterality Date  . BREATH TEK H PYLORI N/A 11/09/2014   Procedure: BREATH TEK H PYLORI;  Surgeon: Greer Pickerel, MD;  Location: [facility];  Service: General;  Laterality: N/A;  . CARDIAC CATHETERIZATION  ~ 2006; 09/2013  . CARDIAC CATHETERIZATION N/A 05/23/2016   Procedure: Right Heart Cath;  Surgeon: Larey Dresser, MD;  Location: [facility];  Service: Cardiovascular;  Laterality: N/A;  . CARDIAC DEFIBRILLATOR PLACEMENT  2006; 12/26/2013   Medtronic [id number]; 12-2013 ICD gen change and RV lead revision with new  3382 RV lead by Dr [name]  . CESAREAN SECTION  1999  . IMPLANTABLE CARDIOVERTER DEFIBRILLATOR GENERATOR CHANGE N/A 12/26/2013   Procedure: IMPLANTABLE CARDIOVERTER DEFIBRILLATOR GENERATOR CHANGE;  Surgeon: Deboraha Sprang, MD;  Location: [facility] CATH LAB;  Service: Cardiovascular;  Laterality: N/A;  . LEAD REVISION N/A 12/26/2013   Procedure: LEAD REVISION;  Surgeon: Deboraha Sprang, MD;  Location[facility] CATH LAB;  Service: Cardiovascular;  Laterality: N/A;  . RIGHT HEART CATHETERIZATION N/A 02/15/2014   Procedure: RIGHT HEART CATH;  Surgeon: Larey Dresser, MD;  Location: [facility] CATH LAB;  Service: Cardiovascular;  Laterality: N/A;  . TUBAL LIGATION  1999    SOCIAL HISTORY: Social History   Tobacco Use  . Smoking status: Never Smoker  . Smokeless tobacco: Never Used  Substance Use Topics  . Alcohol use: No  . Drug use: No    FAMILY HISTORY: Family History  Problem Relation Age of Onset  . Heart disease Maternal Grandmother   . Heart disease Mother   . Obesity Mother   . High blood pressure Father   . Stroke Father     ROS: Review of Systems   Constitutional: Negative for weight loss.  Gastrointestinal: Negative for diarrhea, nausea and vomiting.  Musculoskeletal:       Negative muscle weakness    PHYSICAL EXAM: Pt in no acute distress  RECENT LABS AND TESTS: BMET    Component Value Date/Time   NA 139 11/30/2018 1000   NA 137 03/10/2018 1507   NA 138 09/24/2013 0536   K 4.3 11/30/2018 1000   K 3.7 09/24/2013 0536   CL 108 11/30/2018 1000   CL 107 09/24/2013 0536   CO2 22 11/30/2018 1000   CO2 25 09/24/2013 0536   GLUCOSE 62 (L) 11/30/2018 1000   GLUCOSE 82 09/24/2013 0536   BUN 13 11/30/2018 1000   BUN 10 03/10/2018 1507   BUN 11 09/24/2013 0536   CREATININE 1.07 (H) 11/30/2018 1000   CREATININE 1.08 09/24/2013 0536   CALCIUM 9.6 11/30/2018 1000   CALCIUM 8.6 09/24/2013 0536   GFRNONAA >60 11/30/2018 1000   GFRNONAA >60 09/24/2013 0536   GFRAA >60 11/30/2018 1000   GFRAA >60 09/24/2013 0536   Lab Results  Component Value Date   HGBA1C 5.4 08/17/2018   HGBA1C 5.4 03/10/2018   HGBA1C 5.6 10/05/2017   HGBA1C 5.9 09/19/2013   Lab Results  Component Value Date   INSULIN 15.6 08/17/2018   INSULIN 7.9 03/10/2018   INSULIN 22.5 10/05/2017   CBC    Component Value Date/Time   WBC 12.8 (H) 09/27/2018 0024   RBC 5.44 (H) 09/27/2018 0024   HGB 13.4 09/27/2018 0024   HGB 13.6 10/05/2017 1218   HCT 44.5 09/27/2018 0024   HCT 41.7 10/05/2017 1218   PLT 343 09/27/2018 0024   PLT 195 09/23/2013 0443   MCV 81.8 09/27/2018 0024   MCV 86 10/05/2017 1218   MCV 85 09/23/2013 0443   MCH 24.6 (L) 09/27/2018 0024   MCHC 30.1 09/27/2018 0024   RDW 14.6 09/27/2018 0024   RDW 14.5 10/05/2017 1218   RDW 15.4 (H) 09/23/2013 0443   LYMPHSABS 3.3 09/27/2018 0024   LYMPHSABS 1.9 10/05/2017 1218   LYMPHSABS 2.0 09/23/2013 0443   MONOABS 1.4 (H) 09/27/2018 0024   MONOABS 0.4 09/23/2013 0443   EOSABS 0.1 09/27/2018 0024   EOSABS 0.2 10/05/2017 1218   EOSABS 0.1 09/23/2013 0443   BASOSABS 0.1 09/27/2018 0024    BASOSABS 0.0 10/05/2017  1218   BASOSABS 0.1 09/23/2013 0443   Iron/TIBC/Ferritin/ %Sat No results found for: IRON, TIBC, FERRITIN, IRONPCTSAT Lipid Panel     Component Value Date/Time   CHOL 171 08/17/2018 1147   CHOL 183 09/20/2013 0424   TRIG 111 08/17/2018 1147   TRIG 105 09/20/2013 0424   HDL 46 08/17/2018 1147   HDL 40 09/20/2013 0424   CHOLHDL 3.1 03/10/2018 1507   CHOLHDL 3.3 07/21/2017 0854   VLDL 18 07/21/2017 0854   VLDL 21 09/20/2013 0424   LDLCALC 103 (H) 08/17/2018 1147   LDLCALC 122 (H) 09/20/2013 0424   Hepatic Function Panel     Component Value Date/Time   PROT 7.7 11/30/2018 1000   PROT 7.8 03/10/2018 1507   PROT 7.7 09/19/2013 0431   ALBUMIN 3.7 11/30/2018 1000   ALBUMIN 4.5 03/10/2018 1507   ALBUMIN 3.5 09/19/2013 0431   AST 17 11/30/2018 1000   AST 36 09/19/2013 0431   ALT 11 11/30/2018 1000   ALT 32 09/19/2013 0431   ALKPHOS 71 11/30/2018 1000   ALKPHOS 65 09/19/2013 0431   BILITOT 0.4 11/30/2018 1000   BILITOT 0.4 03/10/2018 1507   BILITOT 0.4 09/19/2013 0431   BILIDIR <0.1 (L) 07/21/2017 0854   IBILI NOT CALCULATED 07/21/2017 0854      Component Value Date/Time   TSH 8.532 (H) 09/27/2018 0159   TSH 12.027 (H) 09/22/2018 1047   TSH 3.680 10/05/2017 1218   TSH 5.995 (H) 11/14/2016 1354   TSH 2.90 02/29/2008 1132      I, Trixie Dredge, am acting as transcriptionist for Abby Potash, PA-C I, Abby Potash, PA-C have reviewed above note and agree with its content

## 2018-12-03 ENCOUNTER — Other Ambulatory Visit (HOSPITAL_COMMUNITY): Payer: Self-pay | Admitting: Cardiology

## 2018-12-09 ENCOUNTER — Ambulatory Visit (INDEPENDENT_AMBULATORY_CARE_PROVIDER_SITE_OTHER): Payer: BC Managed Care – PPO

## 2018-12-09 DIAGNOSIS — Z95810 Presence of automatic (implantable) cardiac defibrillator: Secondary | ICD-10-CM | POA: Diagnosis not present

## 2018-12-09 DIAGNOSIS — I5022 Chronic systolic (congestive) heart failure: Secondary | ICD-10-CM | POA: Diagnosis not present

## 2018-12-10 NOTE — Progress Notes (Signed)
EPIC Encounter for ICM Monitoring  Patient Name: JACIA SICKMAN is a 48 y.o. female Date: 12/10/2018 Primary Care Physican: [name], MD Primary Cardiologist:[place] Electrophysiologist:Klein PJKDTO:671 lbs (4/7 discharge weight) 11/09/2018 Weight:232lbs 11/16/2018 Weight: 230.5 lbs - 232 lbs 12/10/2018 Weight: 234 lbs  Transmission reviewed. Spoke with patient.  Weight is at baseline and she feels fine.  She feels much better since the fluid issue has resolved at increase in Torsemide at last HF clinic visit.   OptivolThoracic impedance normal.  Taking:Torsemide20 mg take 4 tablets (80 mg total) every AM and 40 mg alternating every other afternoon. Potassium 20 mEq take 4 tablets (80 mEq total) every morning and 3 tablets (60 mEq total) every afternoon.  Spironolactone 25 mg take 1 tablet daily.  Metolazone 2.5 mgTake 2.5 mg by mouth daily as needed (for fluid).  Labs: 11/30/2018 Creatinine 1.07, BUN 13, Potassium 4.3, Sodium 139, GFR >60 10/12/2018 Creatinine 1.15, BUN 11, Potassium 3.6, Sodium 136, GFR 57->60 09/30/2018 Creatinine1.23, BUN20, Potassium3.9, Sodium139, GFR52->60 09/28/2018 Creatinine1.04, BUN22, Potassium3.1, Sodium136, GFR>60  09/27/2018 Potassium3.8 (3:18 PM)  09/27/2018 Creatinine1.43, BUN28, Potassium2.5, Sodium130, GFR44-50 (12:24 AM)  09/22/2018 Creatinine1.37, BUN17, Potassium4.0, [id number], [id number]  08/24/2018 Creatinine1.05, BUN13, Potassium3.9, Sodium139, GFR>60  08/10/2018 Creatinine0.98,BUN8, Potassium 3.0, Sodium138, GFR>60 A complete set of results can be found in Results Review.  Recommendations: Encouraged to call for experiencing fluid symptoms.   Follow-up plan: ICM clinic phone appointment on 01/10/2019.    Copy of ICM check sent to Dr. [name].    3 month ICM trend: 12/09/2018    1 Year ICM trend:       [name], RN 12/10/2018  12:46 PM

## 2018-12-14 ENCOUNTER — Encounter (HOSPITAL_COMMUNITY): Payer: BLUE CROSS/BLUE SHIELD | Admitting: Cardiology

## 2018-12-21 ENCOUNTER — Telehealth (INDEPENDENT_AMBULATORY_CARE_PROVIDER_SITE_OTHER): Payer: BLUE CROSS/BLUE SHIELD | Admitting: Family Medicine

## 2018-12-21 ENCOUNTER — Encounter (INDEPENDENT_AMBULATORY_CARE_PROVIDER_SITE_OTHER): Payer: Self-pay | Admitting: Physician Assistant

## 2018-12-21 ENCOUNTER — Telehealth (INDEPENDENT_AMBULATORY_CARE_PROVIDER_SITE_OTHER): Payer: BC Managed Care – PPO | Admitting: Physician Assistant

## 2018-12-21 ENCOUNTER — Other Ambulatory Visit: Payer: Self-pay

## 2018-12-21 DIAGNOSIS — E559 Vitamin D deficiency, unspecified: Secondary | ICD-10-CM

## 2018-12-21 DIAGNOSIS — Z6841 Body Mass Index (BMI) 40.0 and over, adult: Secondary | ICD-10-CM | POA: Diagnosis not present

## 2018-12-22 NOTE — Progress Notes (Signed)
Office: [phone]  /  Fax: [phone] TeleHealth Visit:  [name] has verbally consented to this TeleHealth visit today. The patient is located at home, the provider is located at the [employer] [facility] office. The participants in this visit include the listed provider and patient. The visit was conducted today via Webex.  HPI:   Chief Complaint: OBESITY [name] is here to discuss her progress with her obesity treatment plan. She is on the Category 3 plan and is following her eating plan approximately 60% of the time. She states she is walking 30 minutes 3 times per week. [name]'s most recent weight was reported to be 234 lbs. She reports that she saw her cardiologist and they added an extra diuretic and potassium tablet due to exacerbation of her heart failure. She is back on track with her plan but is sometimes not eating enough during the day. We were unable to weigh the patient today for this TeleHealth visit. She feels as if she has maintained her weight since her last visit. She has lost 28 lbs since starting treatment with [name].  Vitamin D deficiency [name] has a diagnosis of Vitamin D deficiency. She is currently taking prescription Vit D weekly and denies nausea, vomiting or muscle weakness.  ASSESSMENT AND PLAN:  Vitamin D deficiency  Class 3 severe obesity with serious comorbidity and body mass index (BMI) of 40.0 to 44.9 in adult, unspecified obesity type ([place])  PLAN:  Vitamin D Deficiency [name] was informed that low Vitamin D levels contributes to fatigue and are associated with obesity, breast, and colon cancer. She agrees to continue taking prescription Vit D weekly and will follow-up for routine testing of Vitamin D, at least 2-3 times per year. She was informed of the risk of over-replacement of Vitamin D and agrees to not increase her dose unless she discusses this with [name] first. Larrie agrees to follow-up with our clinic in 2 weeks.  Obesity [name] is  currently in the action stage of change. As such, her goal is to continue with weight loss efforts. She has agreed to keep a food journal with 1500 calories and 95 grams of protein.  [name] has been instructed to work up to a goal of 150 minutes of combined cardio and strengthening exercise per week for weight loss and overall health benefits. We discussed the following Behavioral Modification Strategies today: work on meal planning, easy cooking plans, and keeping healthy foods in the home.  [name] has agreed to follow-up with our clinic in 2 weeks. She was informed of the importance of frequent follow-up visits to maximize her success with intensive lifestyle modifications for her multiple health conditions.  ALLERGIES: No Known Allergies  MEDICATIONS: Current Outpatient Medications on File Prior to Visit  Medication Sig Dispense Refill   amiodarone (PACERONE) 200 MG tablet TAKE 1 TABLET BY MOUTH 2 TIMES DAILY FOR10 DAYS, THEN 1 TABLET (200 MG TOTAL) DAILY 40 tablet 2   atorvastatin (LIPITOR) 40 MG tablet TAKE 1 TABLET BY MOUTH ONCE DAILY (Patient taking differently: Take 40 mg by mouth daily at 6 PM. ) 30 tablet 3   carvedilol (COREG) 25 MG tablet TAKE 1 TABLET BY MOUTH TWICE A DAY WITH A MEAL (Patient taking differently: Take 25 mg by mouth 2 (two) times daily with a meal. ) 60 tablet 6   digoxin (LANOXIN) 0.125 MG tablet TAKE 1 TABLET BY MOUTH ONCE DAILY (Patient taking differently: Take 0.125 mg by mouth daily. ) 30 tablet 3  ENTRESTO 24-26 MG TAKE 1 TABLET BY MOUTH TWICE A DAY (Patient taking differently: Take 1 tablet by mouth 2 (two) times daily. ) 60 tablet 5   ivabradine (CORLANOR) 5 MG TABS tablet Take 1 tablet (5 mg total) by mouth 2 (two) times daily with a meal. 60 tablet 6   levothyroxine (SYNTHROID) 25 MCG tablet Take 1 tablet (25 mcg total) by mouth daily before breakfast. 90 tablet 3   metFORMIN (GLUCOPHAGE) 500 MG tablet Take 1 tablet (500 mg total) by mouth daily with  breakfast. 30 tablet 1   metolazone (ZAROXOLYN) 2.5 MG tablet Take 1 tablet (2.5 mg total) by mouth daily. (Patient taking differently: Take 2.5 mg by mouth daily as needed (for fluid). ) 5 tablet 0   potassium chloride SA (K-DUR) 20 MEQ tablet Take 4 tablets (80 mEq total) by mouth every morning AND 3 tablets (60 mEq total) every evening. 180 tablet 5   spironolactone (ALDACTONE) 25 MG tablet Take 1 tablet (25 mg total) by mouth at bedtime. 30 tablet 6   torsemide (DEMADEX) 20 MG tablet Take 4 tabs every AM and 2 tabs in PM every other day 120 tablet 5   Vitamin D, Ergocalciferol, (DRISDOL) 1.25 MG (50000 UT) CAPS capsule Take 1 capsule (50,000 Units total) by mouth every 7 (seven) days. 4 capsule 0   No current facility-administered medications on file prior to visit.     PAST MEDICAL HISTORY: Past Medical History:  Diagnosis Date   Automatic implantable cardioverter-defibrillator in situ    Chronic CHF (congestive heart failure) ([place])    a. EF 15-20% b. RHC (09/2013) RA 14, RV 57/22, PA 64/36 (48), PCWP 18, FIck CO/CI 3.7/1.6, PVR 8.1 WU, PA sat 47%    History of stomach ulcers    Hypertension    Hypotension    Hypothyroidism    Morbid obesity ([place])    Myocardial infarction ([place]) 08/2013   Nocturnal dyspnea    Nonischemic cardiomyopathy ([place])    Sinus tachycardia    Snoring-prob OSA 09/04/2011   Sprint Fidelis ICD lead RECALL  6949     PAST SURGICAL HISTORY: Past Surgical History:  Procedure Laterality Date   BREATH TEK H PYLORI N/A 11/09/2014   Procedure: BREATH TEK H PYLORI;  Surgeon: Greer Pickerel, MD;  Location: [facility];  Service: General;  Laterality: N/A;   CARDIAC CATHETERIZATION  ~ 2006; 09/2013   CARDIAC CATHETERIZATION N/A 05/23/2016   Procedure: Right Heart Cath;  Surgeon: Larey Dresser, MD;  [facility];  Service: Cardiovascular;  Laterality: N/A;   CARDIAC DEFIBRILLATOR PLACEMENT  2006; 12/26/2013   Medtronic [id number];  12-2013 ICD gen change and RV lead revision with new 6935 RV lead by Dr [name]   CESAREAN SECTION  1999   IMPLANTABLE CARDIOVERTER DEFIBRILLATOR GENERATOR CHANGE N/A 12/26/2013   Procedure: IMPLANTABLE CARDIOVERTER DEFIBRILLATOR GENERATOR CHANGE;  Surgeon: Deboraha Sprang, MD;  Location: [facility] CATH LAB;  Service: Cardiovascular;  Laterality: N/A;   LEAD REVISION N/A 12/26/2013   Procedure: LEAD REVISION;  Surgeon: Deboraha Sprang, MD;  Location: [facility] CATH LAB;  Service: Cardiovascular;  Laterality: N/A;   RIGHT HEART CATHETERIZATION N/A 02/15/2014   Procedure: RIGHT HEART CATH;  Surgeon: Larey Dresser, MD;  Location: [facility] CATH LAB;  Service: Cardiovascular;  Laterality: N/A;   TUBAL LIGATION  1999    SOCIAL HISTORY: Social History   Tobacco Use   Smoking status: Never Smoker   Smokeless tobacco: Never Used  Substance Use Topics  Alcohol use: No   Drug use: No    FAMILY HISTORY: Family History  Problem Relation Age of Onset   Heart disease Maternal Grandmother    Heart disease Mother    Obesity Mother    High blood pressure Father    Stroke Father    ROS: Review of Systems  Gastrointestinal: Negative for nausea and vomiting.  Musculoskeletal:       Negative for muscle weakness.   PHYSICAL EXAM: Pt in no acute distress  RECENT LABS AND TESTS: BMET    Component Value Date/Time   NA 139 11/30/2018 1000   NA 137 03/10/2018 1507   NA 138 09/24/2013 0536   K 4.3 11/30/2018 1000   K 3.7 09/24/2013 0536   CL 108 11/30/2018 1000   CL 107 09/24/2013 0536   CO2 22 11/30/2018 1000   CO2 25 09/24/2013 0536   GLUCOSE 62 (L) 11/30/2018 1000   GLUCOSE 82 09/24/2013 0536   BUN 13 11/30/2018 1000   BUN 10 03/10/2018 1507   BUN 11 09/24/2013 0536   CREATININE 1.07 (H) 11/30/2018 1000   CREATININE 1.08 09/24/2013 0536   CALCIUM 9.6 11/30/2018 1000   CALCIUM 8.6 09/24/2013 0536   GFRNONAA >60 11/30/2018 1000   GFRNONAA >60 09/24/2013 0536   GFRAA >60 11/30/2018 1000    GFRAA >60 09/24/2013 0536   Lab Results  Component Value Date   HGBA1C 5.4 08/17/2018   HGBA1C 5.4 03/10/2018   HGBA1C 5.6 10/05/2017   HGBA1C 5.9 09/19/2013   Lab Results  Component Value Date   INSULIN 15.6 08/17/2018   INSULIN 7.9 03/10/2018   INSULIN 22.5 10/05/2017   CBC    Component Value Date/Time   WBC 12.8 (H) 09/27/2018 0024   RBC 5.44 (H) 09/27/2018 0024   HGB 13.4 09/27/2018 0024   HGB 13.6 10/05/2017 1218   HCT 44.5 09/27/2018 0024   HCT 41.7 10/05/2017 1218   PLT 343 09/27/2018 0024   PLT 195 09/23/2013 0443   MCV 81.8 09/27/2018 0024   MCV 86 10/05/2017 1218   MCV 85 09/23/2013 0443   MCH 24.6 (L) 09/27/2018 0024   MCHC 30.1 09/27/2018 0024   RDW 14.6 09/27/2018 0024   RDW 14.5 10/05/2017 1218   RDW 15.4 (H) 09/23/2013 0443   LYMPHSABS 3.3 09/27/2018 0024   LYMPHSABS 1.9 10/05/2017 1218   LYMPHSABS 2.0 09/23/2013 0443   MONOABS 1.4 (H) 09/27/2018 0024   MONOABS 0.4 09/23/2013 0443   EOSABS 0.1 09/27/2018 0024   EOSABS 0.2 10/05/2017 1218   EOSABS 0.1 09/23/2013 0443   BASOSABS 0.1 09/27/2018 0024   BASOSABS 0.0 10/05/2017 1218   BASOSABS 0.1 09/23/2013 0443   Iron/TIBC/Ferritin/ %Sat No results found for: IRON, TIBC, FERRITIN, IRONPCTSAT Lipid Panel     Component Value Date/Time   CHOL 171 08/17/2018 1147   CHOL 183 09/20/2013 0424   TRIG 111 08/17/2018 1147   TRIG 105 09/20/2013 0424   HDL 46 08/17/2018 1147   HDL 40 09/20/2013 0424   CHOLHDL 3.1 03/10/2018 1507   CHOLHDL 3.3 07/21/2017 0854   VLDL 18 07/21/2017 0854   VLDL 21 09/20/2013 0424   LDLCALC 103 (H) 08/17/2018 1147   LDLCALC 122 (H) 09/20/2013 0424   Hepatic Function Panel     Component Value Date/Time   PROT 7.7 11/30/2018 1000   PROT 7.8 03/10/2018 1507   PROT 7.7 09/19/2013 0431   ALBUMIN 3.7 11/30/2018 1000   ALBUMIN 4.5 03/10/2018 1507   ALBUMIN 3.5  09/19/2013 0431   AST 17 11/30/2018 1000   AST 36 09/19/2013 0431   ALT 11 11/30/2018 1000   ALT 32 09/19/2013  0431   ALKPHOS 71 11/30/2018 1000   ALKPHOS 65 09/19/2013 0431   BILITOT 0.4 11/30/2018 1000   BILITOT 0.4 03/10/2018 1507   BILITOT 0.4 09/19/2013 0431   BILIDIR <0.1 (L) 07/21/2017 0854   IBILI NOT CALCULATED 07/21/2017 0854      Component Value Date/Time   TSH 8.532 (H) 09/27/2018 0159   TSH 12.027 (H) 09/22/2018 1047   TSH 3.680 10/05/2017 1218   TSH 5.995 (H) 11/14/2016 1354   TSH 2.90 02/29/2008 1132   Results for CAMRI, MOLLOY (MRN [id number]) as of 12/22/2018 07:55  Ref. Range 08/17/2018 11:47  Vitamin D, 25-Hydroxy Latest Ref Range: 30.0 - 100.0 ng/mL 29.2 (L)   I, Michaelene Song, am acting as Location manager for [employer], PA-C I, Abby Potash, PA-C have reviewed above note and agree with its content

## 2019-01-03 ENCOUNTER — Other Ambulatory Visit: Payer: Self-pay

## 2019-01-03 ENCOUNTER — Encounter (INDEPENDENT_AMBULATORY_CARE_PROVIDER_SITE_OTHER): Payer: Self-pay | Admitting: Physician Assistant

## 2019-01-03 ENCOUNTER — Telehealth (INDEPENDENT_AMBULATORY_CARE_PROVIDER_SITE_OTHER): Payer: BC Managed Care – PPO | Admitting: Physician Assistant

## 2019-01-03 DIAGNOSIS — E8881 Metabolic syndrome: Secondary | ICD-10-CM | POA: Diagnosis not present

## 2019-01-03 DIAGNOSIS — Z6841 Body Mass Index (BMI) 40.0 and over, adult: Secondary | ICD-10-CM | POA: Diagnosis not present

## 2019-01-03 DIAGNOSIS — E559 Vitamin D deficiency, unspecified: Secondary | ICD-10-CM | POA: Diagnosis not present

## 2019-01-03 MED ORDER — VITAMIN D (ERGOCALCIFEROL) 1.25 MG (50000 UNIT) PO CAPS: 50000.0000 [IU] | ORAL_CAPSULE | ORAL | 0 refills | Status: DC

## 2019-01-03 NOTE — Progress Notes (Signed)
Office: [phone]  /  Fax: [phone] TeleHealth Visit:  [name] has verbally consented to this TeleHealth visit today. The patient is located at home, the provider is located at the [employer] [facility] office. The participants in this visit include the listed provider and patient. The visit was conducted today via Webex.  HPI:   Chief Complaint: OBESITY [name] is here to discuss her progress with her obesity treatment plan. She is on the Category 3 plan and is following her eating plan approximately 85-90% of the time. She states she is walking 20 minutes 3 times per week. [name] reports her most recent weight to be 233.4 lbs. She is doing better getting all of her protein in. She reports her fluid level is under control.  We were unable to weigh the patient today for this TeleHealth visit. She feels as if she has maintained her weight since her last visit. She has lost 28 lbs since starting treatment with [name].  Vitamin D deficiency [name] has a diagnosis of Vitamin D deficiency. She is currently taking prescription Vit D and denies nausea, vomiting or muscle weakness.  Insulin Resistance [name] has a diagnosis of insulin resistance based on her elevated fasting insulin level >5. Although [name]'s blood glucose readings are still under good control, insulin resistance puts her at greater risk of metabolic syndrome and diabetes. She is taking metformin currently and continues to work on diet and exercise to decrease risk of diabetes. No nausea, vomiting, or diarrhea.  ASSESSMENT AND PLAN:  Insulin resistance  Vitamin D deficiency - Plan: Vitamin D, Ergocalciferol, (DRISDOL) 1.25 MG (50000 UT) CAPS capsule  Class 3 severe obesity with serious comorbidity and body mass index (BMI) of 40.0 to 44.9 in adult, unspecified obesity type ([place])  PLAN:  Vitamin D Deficiency [name] was informed that low Vitamin D levels contributes to fatigue and are associated with obesity, breast, and  colon cancer. She agrees to continue to take prescription Vit D @ 50,000 IU every week #4 with 0 refills and will follow-up for routine testing of Vitamin D, at least 2-3 times per year. She was informed of the risk of over-replacement of Vitamin D and agrees to not increase her dose unless she discusses this with [name] first. Pattijo agrees to follow-up with our clinic in 3 weeks.  Insulin Resistance [name] will continue to work on weight loss, exercise, and decreasing simple carbohydrates in her diet to help decrease the risk of diabetes. We dicussed metformin including benefits and risks. She was informed that eating too many simple carbohydrates or too many calories at one sitting increases the likelihood of GI side effects. [name] will continue metformin and will follow-up with [name] as directed to monitor her progress.  Obesity [name] is currently in the action stage of change. As such, her goal is to continue with weight loss efforts. She has agreed to follow the Category 3 plan. [name] has been instructed to work up to a goal of 150 minutes of combined cardio and strengthening exercise per week for weight loss and overall health benefits. We discussed the following Behavioral Modification Strategies today: work on meal planning and easy cooking plans, and keeping healthy foods in the home.  [name] has agreed to follow-up with our clinic in 3 weeks. She was informed of the importance of frequent follow-up visits to maximize her success with intensive lifestyle modifications for her multiple health conditions.  ALLERGIES: No Known Allergies  MEDICATIONS: Current Outpatient Medications on File Prior  to Visit  Medication Sig Dispense Refill   amiodarone (PACERONE) 200 MG tablet TAKE 1 TABLET BY MOUTH 2 TIMES DAILY FOR10 DAYS, THEN 1 TABLET (200 MG TOTAL) DAILY 40 tablet 2   atorvastatin (LIPITOR) 40 MG tablet TAKE 1 TABLET BY MOUTH ONCE DAILY (Patient taking differently: Take 40 mg by mouth daily at 6  PM. ) 30 tablet 3   carvedilol (COREG) 25 MG tablet TAKE 1 TABLET BY MOUTH TWICE A DAY WITH A MEAL (Patient taking differently: Take 25 mg by mouth 2 (two) times daily with a meal. ) 60 tablet 6   digoxin (LANOXIN) 0.125 MG tablet TAKE 1 TABLET BY MOUTH ONCE DAILY (Patient taking differently: Take 0.125 mg by mouth daily. ) 30 tablet 3   ENTRESTO 24-26 MG TAKE 1 TABLET BY MOUTH TWICE A DAY (Patient taking differently: Take 1 tablet by mouth 2 (two) times daily. ) 60 tablet 5   ivabradine (CORLANOR) 5 MG TABS tablet Take 1 tablet (5 mg total) by mouth 2 (two) times daily with a meal. 60 tablet 6   levothyroxine (SYNTHROID) 25 MCG tablet Take 1 tablet (25 mcg total) by mouth daily before breakfast. 90 tablet 3   metFORMIN (GLUCOPHAGE) 500 MG tablet Take 1 tablet (500 mg total) by mouth daily with breakfast. 30 tablet 1   metolazone (ZAROXOLYN) 2.5 MG tablet Take 1 tablet (2.5 mg total) by mouth daily. (Patient taking differently: Take 2.5 mg by mouth daily as needed (for fluid). ) 5 tablet 0   potassium chloride SA (K-DUR) 20 MEQ tablet Take 4 tablets (80 mEq total) by mouth every morning AND 3 tablets (60 mEq total) every evening. 180 tablet 5   spironolactone (ALDACTONE) 25 MG tablet Take 1 tablet (25 mg total) by mouth at bedtime. 30 tablet 6   torsemide (DEMADEX) 20 MG tablet Take 4 tabs every AM and 2 tabs in PM every other day 120 tablet 5   No current facility-administered medications on file prior to visit.     PAST MEDICAL HISTORY: Past Medical History:  Diagnosis Date   Automatic implantable cardioverter-defibrillator in situ    Chronic CHF (congestive heart failure) ([place])    a. EF 15-20% b. RHC (09/2013) RA 14, RV 57/22, PA 64/36 (48), PCWP 18, FIck CO/CI 3.7/1.6, PVR 8.1 WU, PA sat 47%    History of stomach ulcers    Hypertension    Hypotension    Hypothyroidism    Morbid obesity ([place])    Myocardial infarction ([place]) 08/2013   Nocturnal dyspnea    Nonischemic  cardiomyopathy ([place])    Sinus tachycardia    Snoring-prob OSA 09/04/2011   Sprint Fidelis ICD lead RECALL  6949     PAST SURGICAL HISTORY: Past Surgical History:  Procedure Laterality Date   BREATH TEK H PYLORI N/A 11/09/2014   Procedure: BREATH TEK H PYLORI;  Surgeon: Greer Pickerel, MD;  Location: [facility];  Service: General;  Laterality: N/A;   CARDIAC CATHETERIZATION  ~ 2006; 09/2013   CARDIAC CATHETERIZATION N/A 05/23/2016   Procedure: Right Heart Cath;  Surgeon: Larey [facility], MD;  Location: [facility];  Service: Cardiovascular;  Laterality: N/A;   CARDIAC DEFIBRILLATOR PLACEMENT  2006; 12/26/2013   Medtronic [id number]; 12-2013 ICD gen change and RV lead revision with new 6283 RV lead by Dr [name]   CESAREAN SECTION  1999   IMPLANTABLE CARDIOVERTER DEFIBRILLATOR GENERATOR CHANGE N/A 12/26/2013   Procedure: IMPLANTABLE CARDIOVERTER DEFIBRILLATOR GENERATOR CHANGE;  Surgeon: Deboraha Sprang,  MD;  Location: [facility]ATH LAB;  Service: Cardiovascular;  Laterality: N/A;   LEAD REVISION N/A 12/26/2013   Procedure: LEAD REVISION;  Surgeon: Deboraha Sprang, MD;  Location: [facility] CATH LAB;  Service: Cardiovascular;  Laterality: N/A;   RIGHT HEART CATHETERIZATION N/A 02/15/2014   Procedure: RIGHT HEART CATH;  Surgeon: Larey Dresser, MD;  Location: [facility] CATH LAB;  Service: Cardiovascular;  Laterality: N/A;   TUBAL LIGATION  1999    SOCIAL HISTORY: Social History   Tobacco Use   Smoking status: Never Smoker   Smokeless tobacco: Never Used  Substance Use Topics   Alcohol use: No   Drug use: No    FAMILY HISTORY: Family History  Problem Relation Age of Onset   Heart disease Maternal Grandmother    Heart disease Mother    Obesity Mother    High blood pressure Father    Stroke Father    ROS: Review of Systems  Gastrointestinal: Negative for diarrhea, nausea and vomiting.  Musculoskeletal:       Negative for muscle weakness.   PHYSICAL EXAM: Pt in no acute  distress  RECENT LABS AND TESTS: BMET    Component Value Date/Time   NA 139 11/30/2018 1000   NA 137 03/10/2018 1507   NA 138 09/24/2013 0536   K 4.3 11/30/2018 1000   K 3.7 09/24/2013 0536   CL 108 11/30/2018 1000   CL 107 09/24/2013 0536   CO2 22 11/30/2018 1000   CO2 25 09/24/2013 0536   GLUCOSE 62 (L) 11/30/2018 1000   GLUCOSE 82 09/24/2013 0536   BUN 13 11/30/2018 1000   BUN 10 03/10/2018 1507   BUN 11 09/24/2013 0536   CREATININE 1.07 (H) 11/30/2018 1000   CREATININE 1.08 09/24/2013 0536   CALCIUM 9.6 11/30/2018 1000   CALCIUM 8.6 09/24/2013 0536   GFRNONAA >60 11/30/2018 1000   GFRNONAA >60 09/24/2013 0536   GFRAA >60 11/30/2018 1000   GFRAA >60 09/24/2013 0536   Lab Results  Component Value Date   HGBA1C 5.4 08/17/2018   HGBA1C 5.4 03/10/2018   HGBA1C 5.6 10/05/2017   HGBA1C 5.9 09/19/2013   Lab Results  Component Value Date   INSULIN 15.6 08/17/2018   INSULIN 7.9 03/10/2018   INSULIN 22.5 10/05/2017   CBC    Component Value Date/Time   WBC 12.8 (H) 09/27/2018 0024   RBC 5.44 (H) 09/27/2018 0024   HGB 13.4 09/27/2018 0024   HGB 13.6 10/05/2017 1218   HCT 44.5 09/27/2018 0024   HCT 41.7 10/05/2017 1218   PLT 343 09/27/2018 0024   PLT 195 09/23/2013 0443   MCV 81.8 09/27/2018 0024   MCV 86 10/05/2017 1218   MCV 85 09/23/2013 0443   MCH 24.6 (L) 09/27/2018 0024   MCHC 30.1 09/27/2018 0024   RDW 14.6 09/27/2018 0024   RDW 14.5 10/05/2017 1218   RDW 15.4 (H) 09/23/2013 0443   LYMPHSABS 3.3 09/27/2018 0024   LYMPHSABS 1.9 10/05/2017 1218   LYMPHSABS 2.0 09/23/2013 0443   MONOABS 1.4 (H) 09/27/2018 0024   MONOABS 0.4 09/23/2013 0443   EOSABS 0.1 09/27/2018 0024   EOSABS 0.2 10/05/2017 1218   EOSABS 0.1 09/23/2013 0443   BASOSABS 0.1 09/27/2018 0024   BASOSABS 0.0 10/05/2017 1218   BASOSABS 0.1 09/23/2013 0443   Iron/TIBC/Ferritin/ %Sat No results found for: IRON, TIBC, FERRITIN, IRONPCTSAT Lipid Panel     Component Value Date/Time   CHOL  171 08/17/2018 1147   CHOL 183 09/20/2013 0424  TRIG 111 08/17/2018 1147   TRIG 105 09/20/2013 0424   HDL 46 08/17/2018 1147   HDL 40 09/20/2013 0424   CHOLHDL 3.1 03/10/2018 1507   CHOLHDL 3.3 07/21/2017 0854   VLDL 18 07/21/2017 0854   VLDL 21 09/20/2013 0424   LDLCALC 103 (H) 08/17/2018 1147   LDLCALC 122 (H) 09/20/2013 0424   Hepatic Function Panel     Component Value Date/Time   PROT 7.7 11/30/2018 1000   PROT 7.8 03/10/2018 1507   PROT 7.7 09/19/2013 0431   ALBUMIN 3.7 11/30/2018 1000   ALBUMIN 4.5 03/10/2018 1507   ALBUMIN 3.5 09/19/2013 0431   AST 17 11/30/2018 1000   AST 36 09/19/2013 0431   ALT 11 11/30/2018 1000   ALT 32 09/19/2013 0431   ALKPHOS 71 11/30/2018 1000   ALKPHOS 65 09/19/2013 0431   BILITOT 0.4 11/30/2018 1000   BILITOT 0.4 03/10/2018 1507   BILITOT 0.4 09/19/2013 0431   BILIDIR <0.1 (L) 07/21/2017 0854   IBILI NOT CALCULATED 07/21/2017 0854      Component Value Date/Time   TSH 8.532 (H) 09/27/2018 0159   TSH 12.027 (H) 09/22/2018 1047   TSH 3.680 10/05/2017 1218   TSH 5.995 (H) 11/14/2016 1354   TSH 2.90 02/29/2008 1132   Results for COLLEENE, SWARTHOUT (MRN [id number]) as of 01/03/2019 14:55  Ref. Range 08/17/2018 11:47  Vitamin D, 25-Hydroxy Latest Ref Range: 30.0 - 100.0 ng/mL 29.2 (L)    I, Michaelene Song, am acting as Location manager for [employer], PA-C I, Abby Potash, PA-C have reviewed above note and agree with its content

## 2019-01-10 ENCOUNTER — Ambulatory Visit (INDEPENDENT_AMBULATORY_CARE_PROVIDER_SITE_OTHER): Payer: BC Managed Care – PPO

## 2019-01-10 DIAGNOSIS — I5022 Chronic systolic (congestive) heart failure: Secondary | ICD-10-CM

## 2019-01-10 DIAGNOSIS — Z95810 Presence of automatic (implantable) cardiac defibrillator: Secondary | ICD-10-CM | POA: Diagnosis not present

## 2019-01-11 ENCOUNTER — Ambulatory Visit (INDEPENDENT_AMBULATORY_CARE_PROVIDER_SITE_OTHER): Payer: BC Managed Care – PPO | Admitting: *Deleted

## 2019-01-11 DIAGNOSIS — I428 Other cardiomyopathies: Secondary | ICD-10-CM | POA: Diagnosis not present

## 2019-01-11 LAB — CUP PACEART REMOTE DEVICE CHECK
Battery Remaining Longevity: 84 mo
Battery Voltage: 2.98 V
Brady Statistic RV Percent Paced: 0.01 %
Date Time Interrogation Session: 20200721094347
HighPow Impedance: 80 Ohm
Implantable Lead Implant Date: 20150706
Implantable Lead Location: 753860
Implantable Pulse Generator Implant Date: 20150706
Lead Channel Impedance Value: 456 Ohm
Lead Channel Impedance Value: 570 Ohm
Lead Channel Pacing Threshold Amplitude: 0.375 V
Lead Channel Pacing Threshold Pulse Width: 0.4 ms
Lead Channel Sensing Intrinsic Amplitude: 7.875 mV
Lead Channel Sensing Intrinsic Amplitude: 7.875 mV
Lead Channel Setting Pacing Amplitude: 2 V
Lead Channel Setting Pacing Pulse Width: 0.4 ms
Lead Channel Setting Sensing Sensitivity: 0.3 mV

## 2019-01-11 NOTE — Progress Notes (Signed)
EPIC Encounter for ICM Monitoring  Patient Name: CYNDY BRAVER is a 48 y.o. female Date: 01/11/2019 Primary Care Physican: [name], MD Primary Cardiologist:[place] Electrophysiologist:Klein 12/10/2018 Weight: 234 lbs 01/12/2019 Weight: 236 lbs  Spoke with patient.  Weight was up about 5 lbs this past weekend but has lost 3 pounds.   She has taken extra Torsemide for a couple of days.  She is feeling fine today.    OptivolThoracic impedancereturning to normal.  Taking:Torsemide20 mg take 4 tablets (80 mg total) every AM and 40 mg alternating every other afternoon. Potassium 20 mEq take 4 tablets (80 mEq total) every morning and 3 tablets (60 mEq total) every afternoon.  Spironolactone 25 mg take 1 tablet daily.  Metolazone 2.5 mgTake 2.5 mg by mouth daily as needed (for fluid).  Labs: 11/30/2018 Creatinine 1.07, BUN 13, Potassium 4.3, Sodium 139, GFR >60 10/12/2018 Creatinine 1.15, BUN 11, Potassium 3.6, Sodium 136, GFR 57->60 A complete set of results can be found in Results Review.  Recommendations: Encouraged to call for experiencing fluid symptoms.   Follow-up plan: ICM clinic phone appointment on 02/14/2019.Office visit scheduled with Dr [name] 01/21/2019.  Copy of ICM check sent to [place].    3 month ICM trend: 01/11/2019    1 Year ICM trend:       [name], RN 01/11/2019 10:39 AM

## 2019-01-14 ENCOUNTER — Other Ambulatory Visit (HOSPITAL_COMMUNITY): Payer: Self-pay | Admitting: Cardiology

## 2019-01-21 ENCOUNTER — Other Ambulatory Visit: Payer: Self-pay

## 2019-01-21 ENCOUNTER — Other Ambulatory Visit (HOSPITAL_COMMUNITY): Payer: Self-pay

## 2019-01-21 ENCOUNTER — Encounter (HOSPITAL_COMMUNITY): Payer: BLUE CROSS/BLUE SHIELD | Admitting: Cardiology

## 2019-01-21 ENCOUNTER — Telehealth (HOSPITAL_COMMUNITY): Payer: Self-pay

## 2019-01-21 ENCOUNTER — Encounter (HOSPITAL_COMMUNITY): Payer: Self-pay | Admitting: Cardiology

## 2019-01-21 ENCOUNTER — Ambulatory Visit (HOSPITAL_COMMUNITY)
Admission: RE | Admit: 2019-01-21 | Discharge: 2019-01-21 | Disposition: A | Discharge status: Home / Self Care | Payer: BC Managed Care – PPO | Source: Ambulatory Visit | Attending: Cardiology | Admitting: Cardiology

## 2019-01-21 VITALS — BP 99/53 | HR 72 | Wt 237.2 lb

## 2019-01-21 DIAGNOSIS — I493 Ventricular premature depolarization: Secondary | ICD-10-CM | POA: Diagnosis not present

## 2019-01-21 DIAGNOSIS — E785 Hyperlipidemia, unspecified: Secondary | ICD-10-CM | POA: Insufficient documentation

## 2019-01-21 DIAGNOSIS — R079 Chest pain, unspecified: Secondary | ICD-10-CM | POA: Diagnosis not present

## 2019-01-21 DIAGNOSIS — Z8249 Family history of ischemic heart disease and other diseases of the circulatory system: Secondary | ICD-10-CM | POA: Insufficient documentation

## 2019-01-21 DIAGNOSIS — I272 Pulmonary hypertension, unspecified: Secondary | ICD-10-CM | POA: Insufficient documentation

## 2019-01-21 DIAGNOSIS — Z6841 Body Mass Index (BMI) 40.0 and over, adult: Secondary | ICD-10-CM | POA: Diagnosis not present

## 2019-01-21 DIAGNOSIS — I110 Hypertensive heart disease with heart failure: Secondary | ICD-10-CM | POA: Insufficient documentation

## 2019-01-21 DIAGNOSIS — I428 Other cardiomyopathies: Secondary | ICD-10-CM | POA: Insufficient documentation

## 2019-01-21 DIAGNOSIS — Z79899 Other long term (current) drug therapy: Secondary | ICD-10-CM | POA: Insufficient documentation

## 2019-01-21 DIAGNOSIS — I5022 Chronic systolic (congestive) heart failure: Secondary | ICD-10-CM

## 2019-01-21 DIAGNOSIS — R0609 Other forms of dyspnea: Secondary | ICD-10-CM | POA: Insufficient documentation

## 2019-01-21 DIAGNOSIS — R42 Dizziness and giddiness: Secondary | ICD-10-CM | POA: Diagnosis not present

## 2019-01-21 DIAGNOSIS — E039 Hypothyroidism, unspecified: Secondary | ICD-10-CM | POA: Insufficient documentation

## 2019-01-21 DIAGNOSIS — Z95810 Presence of automatic (implantable) cardiac defibrillator: Secondary | ICD-10-CM | POA: Insufficient documentation

## 2019-01-21 DIAGNOSIS — E876 Hypokalemia: Secondary | ICD-10-CM | POA: Insufficient documentation

## 2019-01-21 DIAGNOSIS — I472 Ventricular tachycardia: Secondary | ICD-10-CM | POA: Diagnosis not present

## 2019-01-21 DIAGNOSIS — Z7984 Long term (current) use of oral hypoglycemic drugs: Secondary | ICD-10-CM | POA: Diagnosis not present

## 2019-01-21 LAB — BASIC METABOLIC PANEL
Anion gap: 10 (ref 5–15)
BUN: 14 mg/dL (ref 6–20)
CO2: 24 mmol/L (ref 22–32)
Calcium: 9.3 mg/dL (ref 8.9–10.3)
Chloride: 104 mmol/L (ref 98–111)
Creatinine, Ser: 1.16 mg/dL — ABNORMAL HIGH (ref 0.44–1.00)
GFR calc Af Amer: >60 mL/min (ref >60)
GFR calc non Af Amer: 56 mL/min — ABNORMAL LOW (ref >60)
Glucose, Bld: 98 mg/dL (ref 70–99)
Potassium: 3.8 mmol/L (ref 3.5–5.1)
Sodium: 138 mmol/L (ref 135–145)

## 2019-01-21 LAB — DIGOXIN LEVEL: Digoxin Level: 1.2 ng/mL (ref 0.8–2.0)

## 2019-01-21 NOTE — Telephone Encounter (Signed)
-----   Message from Larey Dresser, MD sent at 01/21/2019  2:27 PM EDT ----- Mildly elevated digoxin level, have her repeat digoxin level as trough before taking am digoxin dose.

## 2019-01-21 NOTE — Patient Instructions (Signed)
Labs were done today. We will call you with any ABNORMAL results. No news is good news!  You have been referred to complete a Cardiopulmonary Exercise Test.  Your physician recommends that you schedule a follow-up appointment in: 3 months.  At the [facility], you and your health needs are our priority. As part of our continuing mission to provide you with exceptional heart care, we have created designated Provider Care Teams. These Care Teams include your primary Cardiologist (physician) and Advanced Practice Providers (APPs- Physician Assistants and Nurse Practitioners) who all work together to provide you with the care you need, when you need it.   You may see any of the following providers on your designated Care Team at your next follow up: Marland Kitchen Dr Glori Bickers . Dr [name] . Darrick Grinder, NP   Please be sure to bring in all your medications bottles to every appointment.

## 2019-01-21 NOTE — Telephone Encounter (Signed)
Relayed message to pt, made f/u lab appt

## 2019-01-23 NOTE — Progress Notes (Signed)
Date:  10/05/2018   ID:  [name], DOB 1971-01-03, MRN [id number]    Provider location: [facility] Type of Visit: Established patient   PCP:  Miranda Pitch, MD    Cardiologist:  Miranda Champagne, MD  Chief Complaint: Exertional dyspnea.   History of Present Illness: [name] is a 48 y.o. 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  She was followed for systolic CHF initially in [place]. Apparently her EF recovered to 35-40%. However, she was admitted to [facility] 09/19/13 for worsening dyspnea and CP. Coronaries were reportedly OK on LHC at that time at [facility]. Echo showed EF of 15-20% with four-chamber enlargement with moderate-severe TR/MR. RHC as below. Rheumatological w/u negative.  CPX in 9/16 showed near-normal functional capacity.  Repeat echo in 6/17 shows that EF remains 20%. Repeat CPX in 1/18 was submaximal but probably only mild circulatory limitation.  Echo 8/18 showed EF 25-30%, severe LV dilation.  CPX in 6/19 showed low normal functional capacity.  Echo in 10/19 showed EF 20-25%, moderate LV dilation.   Zio patch in 2/20 with 11% PVCs and NSVT, amiodarone started. Zio patch in 3/20 with 13.3% PVCs and NSVT.  She saw Dr. [name], he thought that increased PVCs were electromechanical in nature due to worsening heart failure.   She was admitted in 4/20 with VT in setting of hypokalemia.    She returns for followup of CHF.  Weight is down 11 lbs.  She has been breathing better recently, orthopnea has resolved.  No dyspnea walking on flat ground or up a flight of stairs.  She has been watching her grand-daughter, which keeps her very active.  Rare lightheadedness.    Medtronic device interrogation: stable thoracic impedance.     RHC Procedural Findings: RA mean 5 RV 55/8 PA 56/31, mean 40 PCWP mean 24 Oxygen saturations: PA 61% AO  99% Cardiac Output (Fick) 4.3  Cardiac Index (Fick) 2.0 PVR 3.7 WU  RHC 09/24/13  RA 14  RV 57/22  PA 64/36 (48)  PCWP 18  Fick CO/CI: 3.7/1.6  PVR 8.1 WU  Ao sat 95%  PA sat 47%   CPX 06/27/2016  Peak VO2: 13.5 (56.2% predicted peak VO2) VE/VCO2 slope: 33 OUES: 2.16 Peak RER: 0.98  CPX  1/18 Peak VO2 13.5 VE/VCO2 slope 33 RER 0.98 Submaximal study, mild circulatory limitation, suspect more limited by body habitus.   Echo (8/15) with EF 20%, moderate LV dilation, diffuse hypokinesis, normal RV size with mildly decreased systolic function, mild MR.  Echo (5/16) with EF 20%, spherical LV with diffuse hypokinesis, mild MR.  Echo (6/17) with EF 20%, diffuse hypokinesis, moderate LV dilation, normal RV, mild to moderate MR.  Echo (8/18) with EF 25-30%, severe LV dilation, severe LAE.  Echo (2/20) with EF 20-25%, moderate LV dilation, moderate MR.   Sleep study did not show significant OSA.   RHC (8/15): Mean RA 3 PA 33/11 mean 19 Mean PCWP 8 CI 2.35  Holter (8/15) with < 1% PVCs  CPX 9/16 Peak VO2 16.2 VE/VCO2 slope 23.6 RER 1.25 Normal functional capacity  CPX (6/19): peak VO2 14.3 (83% predicted), VE/VCO2 slope 30 => low normal functional capacity.   Labs: (11/09/13): Dig level 0.5, K 3.6, Creatinine 0.83, pro-BNP 622 (12/19/13): K 3.6, creatinine 0.8 (7/15): K 4.1, creatinine 0.91 (8/15): K 3.9, creatinine 0.91 (10/15): K 4,  creatinine 0.93, proBNP 129 (12/15): K 4, creatinine 0.9, digoxin 0.4 (5/16): K 4, creatinine 0.86 (9/16): K 3.6, creatinine 0.86, digoxin < 0.2 (5/17): K 4.1, creatinine 0.95, digoxin 0.3 (5/18): K 3.4, creatinine 0.95, TSH elevated but free T3 and free T4 normal.  (8/18): K 3.9, creatinine 0.87, BNP 108  (11/18): K 3.5, creatinine 1.04, digoxin 0.4 (1/19): LDL 91, HDL 47 (3/19): digoxin 0.7, K 4, creatinine 0.99 (4/19): TSH normal, LDL 95 (9/19): LDL 99, HDL 45, K 4, creatinine 0.96 (10/19): K 3.6, creatinine 0.85, digoxin  0.5 (2/20): LDL 103 (3/20): K 3.9, creatinine 1.05 (4/20): K 3.9, creatinine 1.23 => 1.15, TSH 8, digoxin 0.3, LFTs normal 6/20): K 4.3, creatinine 1.07, LFTs normal, digoxin 0.5  Current Outpatient Medications  Medication Sig Dispense Refill   amiodarone (PACERONE) 200 MG tablet TAKE 1 TABLET BY MOUTH 2 TIMES DAILY FOR10 DAYS, THEN 1 TABLET (200 MG TOTAL) DAILY 40 tablet 2   atorvastatin (LIPITOR) 40 MG tablet TAKE 1 TABLET BY MOUTH ONCE DAILY 30 tablet 5   carvedilol (COREG) 25 MG tablet TAKE 1 TABLET BY MOUTH TWICE A DAY WITH A MEAL (Patient taking differently: Take 25 mg by mouth 2 (two) times daily with a meal. ) 60 tablet 6   digoxin (LANOXIN) 0.125 MG tablet TAKE 1 TABLET BY MOUTH ONCE DAILY 30 tablet 5   ENTRESTO 24-26 MG TAKE 1 TABLET BY MOUTH TWICE (2) DAILY 60 tablet 5   ivabradine (CORLANOR) 5 MG TABS tablet Take 1 tablet (5 mg total) by mouth 2 (two) times daily with a meal. 60 tablet 6   levothyroxine (SYNTHROID) 25 MCG tablet Take 1 tablet (25 mcg total) by mouth daily before breakfast. 90 tablet 3   metFORMIN (GLUCOPHAGE) 500 MG tablet Take 1 tablet (500 mg total) by mouth daily with breakfast. 30 tablet 1   potassium chloride SA (K-DUR) 20 MEQ tablet Take 4 tablets (80 mEq total) by mouth every morning AND 3 tablets (60 mEq total) every evening. 180 tablet 5   spironolactone (ALDACTONE) 25 MG tablet Take 1 tablet (25 mg total) by mouth at bedtime. 30 tablet 6   torsemide (DEMADEX) 20 MG tablet Take 4 tabs every AM and 2 tabs in PM every other day 120 tablet 5   Vitamin D, Ergocalciferol, (DRISDOL) 1.25 MG (50000 UT) CAPS capsule Take 1 capsule (50,000 Units total) by mouth every 7 (seven) days. 4 capsule 0   metolazone (ZAROXOLYN) 2.5 MG tablet Take 1 tablet (2.5 mg total) by mouth daily. (Patient taking differently: Take 2.5 mg by mouth daily as needed (for fluid). ) 5 tablet 0   No current facility-administered medications for this encounter.     Allergies:    Patient has no known allergies.   Social History:  The patient  reports that she has never smoked. She has never used smokeless tobacco. She reports that she does not drink alcohol or use drugs.   Family History:  The patient's family history includes Heart disease in her maternal grandmother and mother; High blood pressure in her father; Obesity in her mother; Stroke in her father.   ROS:  Please see the history of present illness.   All other systems are personally reviewed and negative.   Exam:   BP (!) 99/53    Pulse 72    Wt 107.6 kg (237 lb 3.2 oz)    SpO2 100%    BMI 42.02 kg/m  General: NAD Neck: No JVD, no thyromegaly or thyroid nodule.  Lungs: Clear to auscultation bilaterally with normal respiratory effort. CV: Nondisplaced PMI.  Heart regular S1/S2, no S3/S4, no murmur.  No peripheral edema.  No carotid bruit.  Normal pedal pulses.  Abdomen: Soft, nontender, no hepatosplenomegaly, no distention.  Skin: Intact without lesions or rashes.  Neurologic: Alert and oriented x 3.  Psych: Normal affect. Extremities: No clubbing or cyanosis.  HEENT: Normal.    Recent Labs: 09/27/2018: B Natriuretic Peptide 404.7; Hemoglobin 13.4; Platelets 343; TSH 8.532 10/12/2018: Magnesium 1.9 11/30/2018: ALT 11 01/21/2019: BUN 14; Creatinine, Ser 1.16; Potassium 3.8; Sodium 138  Personally reviewed   Wt Readings from Last 3 Encounters:  01/21/19 107.6 kg (237 lb 3.2 oz)  09/28/18 107.9 kg (237 lb 14.4 oz)  09/22/18 107.4 kg (236 lb 12.8 oz)      ASSESSMENT AND PLAN:  1. Chronic systolic HF: Nonischemic cardiomyopathy.  She has a Medtronic ICD.  Cause for cardiomyopathy is uncertain: initially noted peri-partum (after son's birth), so cannot rule out peri-partum CMP.  On and off, she has had frequent PVCs.   RHC in 8/15 showed normal filling pressures and preserved cardiac output.  She does not qualify for CRT with narrow QRS.  CPX 6/19 with low normal functional capacity.  Last echo in 10/19  showed EF 20-25% with moderate LV dilation and moderate MR. She does not look volume overloaded on exam and thoracic impedance stable on Medtronic device.  NYHA class II symptoms, somewhat improved.  BP continues to run on the low side but no lightheadedness.  - Continue torsemide 80 mg qam, 40 mg qam alternating with 100 qam, 40 qpm.  Continue KCl 80 qam/60 qpm. BMET today.   - Continue Entresto 24/26 mg BID, no BP room to increase.    - Continue Coreg 25 mg bid and ivabradine 5 mg bid.   - Continue spironolactone 25 mg daily and digoxin, check digoxin level today.   - I will arrange for CPX.  2. Pulmonary HTN: Mixed picture with PVR 8.1 on RHC at [facility].  However, repeat study in 8/15 here showed no significant pulmonary hypertension.  3. Morbid obesity: Continue to work on weight loss.   4. Hyperlipidemia: Good lipids in 9/19.   5. PVCs: Worsened with 11% PVCs + NSVT on 2/20 Zio patch.  Amiodarone started, but PVCs up to 13.3% with NSVT on 3/20 Zio patch. She saw Dr. [name] who thought that the ventricular arrhythmias were electromechanical due to worsening HF.  In 4/20, she had VT x 2 with ICD discharges in the setting of hypokalemia.  - Continue amiodarone, check LFTs today.  She will need a regular eye exam with amiodarone use.  6. Hypothyroidism: She is on levothyroxine now.   Recommended follow-up:  3 months  Signed, Miranda Champagne, MD  01/23/2019  [place] [address] [facility] [place] [address] [phone] (office) [phone] (fax)

## 2019-01-25 ENCOUNTER — Ambulatory Visit (INDEPENDENT_AMBULATORY_CARE_PROVIDER_SITE_OTHER): Payer: BC Managed Care – PPO | Admitting: Physician Assistant

## 2019-01-25 ENCOUNTER — Other Ambulatory Visit: Payer: Self-pay

## 2019-01-25 ENCOUNTER — Encounter (INDEPENDENT_AMBULATORY_CARE_PROVIDER_SITE_OTHER): Payer: Self-pay | Admitting: Physician Assistant

## 2019-01-25 VITALS — BP 115/79 | HR 82 | Temp 98.0°F | Ht 63.0 in | Wt 232.0 lb

## 2019-01-25 DIAGNOSIS — E559 Vitamin D deficiency, unspecified: Secondary | ICD-10-CM

## 2019-01-25 DIAGNOSIS — E7849 Other hyperlipidemia: Secondary | ICD-10-CM | POA: Diagnosis not present

## 2019-01-25 DIAGNOSIS — Z6841 Body Mass Index (BMI) 40.0 and over, adult: Secondary | ICD-10-CM

## 2019-01-25 DIAGNOSIS — R739 Hyperglycemia, unspecified: Secondary | ICD-10-CM | POA: Diagnosis not present

## 2019-01-25 DIAGNOSIS — E038 Other specified hypothyroidism: Secondary | ICD-10-CM | POA: Diagnosis not present

## 2019-01-25 DIAGNOSIS — E8881 Metabolic syndrome: Secondary | ICD-10-CM | POA: Diagnosis not present

## 2019-01-25 MED ORDER — VITAMIN D (ERGOCALCIFEROL) 1.25 MG (50000 UNIT) PO CAPS: 50000.0000 [IU] | ORAL_CAPSULE | ORAL | 0 refills | Status: DC

## 2019-01-25 NOTE — Progress Notes (Signed)
Office: [phone]  /  Fax: [phone]   HPI:   Chief Complaint: OBESITY [name] is here to discuss her progress with her obesity treatment plan. She is on the Category 3 plan and is following her eating plan approximately 90% of the time. She states she is walking 15-20 minutes 3 times per week. [name] reports that she is getting all of her protein in daily. She continues to see her cardiologist for close follow-up of her heart failure.  Her weight is 232 lb (105.2 kg) today and has had a weight loss of 17 pounds over a period of 5 months since her last in-office visit. She has lost 45 lbs since starting treatment with [name].  Insulin Resistance [name] has a diagnosis of insulin resistance based on her elevated fasting insulin level >5. Although [name]'s blood glucose readings are still under good control, insulin resistance puts her at greater risk of metabolic syndrome and diabetes. She is taking metformin currently and continues to work on diet and exercise to decrease risk of diabetes. No nausea, vomiting, or diarrhea.  Vitamin D deficiency [name] has a diagnosis of Vitamin D deficiency. She is currently taking prescription Vit D and denies nausea, vomiting or muscle weakness.  Hypothyroidism [name] has a diagnosis of hypothyroidism. She is on levothyroxine. She denies excessive fatigue.  Hyperlipidemia [name] has hyperlipidemia and has been trying to improve her cholesterol levels with intensive lifestyle modification including a low saturated fat diet, exercise and weight loss. She is on atorvastatin and denies any chest pain.   ASSESSMENT AND PLAN:  Insulin resistance - Plan: Hemoglobin A1c, Insulin, random  Vitamin D deficiency - Plan: VITAMIN D 25 Hydroxy (Vit-D Deficiency, Fractures), Vitamin D, Ergocalciferol, (DRISDOL) 1.25 MG (50000 UT) CAPS capsule  Other specified hypothyroidism - Plan: T3, T4, free, TSH  Other hyperlipidemia - Plan: Lipid Panel With LDL/HDL Ratio  Class  3 severe obesity with serious comorbidity and body mass index (BMI) of 40.0 to 44.9 in adult, unspecified obesity type ([place])  PLAN:  Insulin Resistance Lennix will continue to work on weight loss, exercise, and decreasing simple carbohydrates in her diet to help decrease the risk of diabetes. We dicussed metformin including benefits and risks. She was informed that eating too many simple carbohydrates or too many calories at one sitting increases the likelihood of GI side effects. [name] will continue metformin and have labs checked. She will follow-up with [name] as directed to monitor her progress.  Vitamin D Deficiency [name] was informed that low Vitamin D levels contributes to fatigue and are associated with obesity, breast, and colon cancer. She agrees to continue to take prescription Vit D @ 50,000 IU every week #4 with 0 refills and will have follow-up for routine testing of Vitamin D, at least 2-3 times per year. She was informed of the risk of over-replacement of Vitamin D and agrees to not increase her dose unless she discusses this with [name] first. [name] agrees to follow-up with our clinic in 3 weeks.  Hypothyroidism [name] was informed of the importance of good thyroid control to help with weight loss efforts. She was also informed that supertheraputic thyroid levels are dangerous and will not improve weight loss results. [name] will continue levothyroxine and will have labs checked.  Hyperlipidemia [name] was informed of the American Heart Association Guidelines emphasizing intensive lifestyle modifications as the first line treatment for hyperlipidemia. We discussed many lifestyle modifications today in depth, and [name] will continue to work on decreasing saturated fats such as  fatty red meat, butter and many fried foods. She will continue atorvastatin, have labs checked, increase vegetables and lean protein in her diet, and continue to work on exercise and weight loss efforts.  Obesity [name] is  currently in the action stage of change. As such, her goal is to continue with weight loss efforts. She has agreed to follow the Category 3 plan. [name] has been instructed to work up to a goal of 150 minutes of combined cardio and strengthening exercise per week for weight loss and overall health benefits. We discussed the following Behavioral Modification Strategies today: work on meal planning and easy cooking plans, and keeping healthy foods in the home.  [name] has agreed to follow-up with our clinic in 3 weeks. She was informed of the importance of frequent follow-up visits to maximize her success with intensive lifestyle modifications for her multiple health conditions.  ALLERGIES: No Known Allergies  MEDICATIONS: Current Outpatient Medications on File Prior to Visit  Medication Sig Dispense Refill   amiodarone (PACERONE) 200 MG tablet TAKE 1 TABLET BY MOUTH 2 TIMES DAILY FOR10 DAYS, THEN 1 TABLET (200 MG TOTAL) DAILY 40 tablet 2   atorvastatin (LIPITOR) 40 MG tablet TAKE 1 TABLET BY MOUTH ONCE DAILY 30 tablet 5   carvedilol (COREG) 25 MG tablet TAKE 1 TABLET BY MOUTH TWICE A DAY WITH A MEAL (Patient taking differently: Take 25 mg by mouth 2 (two) times daily with a meal. ) 60 tablet 6   digoxin (LANOXIN) 0.125 MG tablet TAKE 1 TABLET BY MOUTH ONCE DAILY 30 tablet 5   ENTRESTO 24-26 MG TAKE 1 TABLET BY MOUTH TWICE (2) DAILY 60 tablet 5   ivabradine (CORLANOR) 5 MG TABS tablet Take 1 tablet (5 mg total) by mouth 2 (two) times daily with a meal. 60 tablet 6   levothyroxine (SYNTHROID) 25 MCG tablet Take 1 tablet (25 mcg total) by mouth daily before breakfast. 90 tablet 3   metFORMIN (GLUCOPHAGE) 500 MG tablet Take 1 tablet (500 mg total) by mouth daily with breakfast. 30 tablet 1   metolazone (ZAROXOLYN) 2.5 MG tablet Take 1 tablet (2.5 mg total) by mouth daily. (Patient taking differently: Take 2.5 mg by mouth daily as needed (for fluid). ) 5 tablet 0   potassium chloride SA  (K-DUR) 20 MEQ tablet Take 4 tablets (80 mEq total) by mouth every morning AND 3 tablets (60 mEq total) every evening. 180 tablet 5   spironolactone (ALDACTONE) 25 MG tablet Take 1 tablet (25 mg total) by mouth at bedtime. 30 tablet 6   torsemide (DEMADEX) 20 MG tablet Take 4 tabs every AM and 2 tabs in PM every other day 120 tablet 5   No current facility-administered medications on file prior to visit.     PAST MEDICAL HISTORY: Past Medical History:  Diagnosis Date   Automatic implantable cardioverter-defibrillator in situ    Chronic CHF (congestive heart failure) ([place])    a. EF 15-20% b. RHC (09/2013) RA 14, RV 57/22, PA 64/36 (48), PCWP 18, FIck CO/CI 3.7/1.6, PVR 8.1 WU, PA sat 47%    History of stomach ulcers    Hypertension    Hypotension    Hypothyroidism    Morbid obesity ([place])    Myocardial infarction ([place]) 08/2013   Nocturnal dyspnea    Nonischemic cardiomyopathy ([facility])    Sinus tachycardia    Snoring-prob OSA 09/04/2011   [employer] ICD lead RECALL  6949     PAST SURGICAL HISTORY: Past Surgical History:  Procedure Laterality Date   BREATH TEK H PYLORI N/A 11/09/2014   Procedure: BREATH TEK Kandis Ban;  Surgeon: Greer Pickerel, MD;  Location: [facility];  Service: General;  Laterality: N/A;   CARDIAC CATHETERIZATION  ~ 2006; 09/2013   CARDIAC CATHETERIZATION N/A 05/23/2016   Procedure: Right Heart Cath;  Surgeon: Larey Dresser, MD;  Location: [facility];  Service: Cardiovascular;  Laterality: N/A;   CARDIAC DEFIBRILLATOR PLACEMENT  2006; 12/26/2013   [employer] [id number]; 12-2013 ICD gen change and RV lead revision with new 6935 RV lead by Dr [name]   CESAREAN SECTION  1999   IMPLANTABLE CARDIOVERTER DEFIBRILLATOR GENERATOR CHANGE N/A 12/26/2013   Procedure: IMPLANTABLE CARDIOVERTER DEFIBRILLATOR GENERATOR CHANGE;  Surgeon: Deboraha Sprang, MD;  Location: [facility] CATH LAB;  Service: Cardiovascular;  Laterality: N/A;   LEAD REVISION N/A 12/26/2013    Procedure: LEAD REVISION;  Surgeon: Deboraha Sprang, MD;  Location: [facility] CATH LAB;  Service: Cardiovascular;  Laterality: N/A;   RIGHT HEART CATHETERIZATION N/A 02/15/2014   Procedure: RIGHT HEART CATH;  Surgeon: Larey Dresser, MD;  Location: [facility] CATH LAB;  Service: Cardiovascular;  Laterality: N/A;   TUBAL LIGATION  1999    SOCIAL HISTORY: Social History   Tobacco Use   Smoking status: Never Smoker   Smokeless tobacco: Never Used  Substance Use Topics   Alcohol use: No   Drug use: No    FAMILY HISTORY: Family History  Problem Relation Age of Onset   Heart disease Maternal Grandmother    Heart disease Mother    Obesity Mother    High blood pressure Father    Stroke Father    ROS: Review of Systems  Constitutional: Negative for malaise/fatigue (excessive fatigue).  Cardiovascular: Negative for chest pain.  Gastrointestinal: Negative for diarrhea, nausea and vomiting.  Musculoskeletal:       Negative for muscle weakness.   PHYSICAL EXAM: Blood pressure 115/79, pulse 82, temperature 98 F (36.7 C), temperature source Oral, height 5\' 3"  (1.6 m), weight 232 lb (105.2 kg), SpO2 100 %. Body mass index is 41.1 kg/m. Physical Exam Vitals signs reviewed.  Constitutional:      Appearance: Normal appearance. She is obese.  Cardiovascular:     Rate and Rhythm: Normal rate.     Pulses: Normal pulses.  Pulmonary:     Effort: Pulmonary effort is normal.     Breath sounds: Normal breath sounds.  Musculoskeletal: Normal range of motion.  Skin:    General: Skin is warm and dry.  Neurological:     Mental Status: She is alert and oriented to person, place, and time.  Psychiatric:        Behavior: Behavior normal.   RECENT LABS AND TESTS: BMET    Component Value Date/Time   NA 138 01/21/2019 1120   NA 137 03/10/2018 1507   NA 138 09/24/2013 0536   K 3.8 01/21/2019 1120   K 3.7 09/24/2013 0536   CL 104 01/21/2019 1120   CL 107 09/24/2013 0536   CO2 24 01/21/2019  1120   CO2 25 09/24/2013 0536   GLUCOSE 98 01/21/2019 1120   GLUCOSE 82 09/24/2013 0536   BUN 14 01/21/2019 1120   BUN 10 03/10/2018 1507   BUN 11 09/24/2013 0536   CREATININE 1.16 (H) 01/21/2019 1120   CREATININE 1.08 09/24/2013 0536   CALCIUM 9.3 01/21/2019 1120   CALCIUM 8.6 09/24/2013 0536   GFRNONAA 56 (L) 01/21/2019 1120   GFRNONAA >60 09/24/2013 0536  GFRAA >60 01/21/2019 1120   GFRAA >60 09/24/2013 0536   Lab Results  Component Value Date   HGBA1C 5.4 08/17/2018   HGBA1C 5.4 03/10/2018   HGBA1C 5.6 10/05/2017   HGBA1C 5.9 09/19/2013   Lab Results  Component Value Date   INSULIN 15.6 08/17/2018   INSULIN 7.9 03/10/2018   INSULIN 22.5 10/05/2017   CBC    Component Value Date/Time   WBC 12.8 (H) 09/27/2018 0024   RBC 5.44 (H) 09/27/2018 0024   HGB 13.4 09/27/2018 0024   HGB 13.6 10/05/2017 1218   HCT 44.5 09/27/2018 0024   HCT 41.7 10/05/2017 1218   PLT 343 09/27/2018 0024   PLT 195 09/23/2013 0443   MCV 81.8 09/27/2018 0024   MCV 86 10/05/2017 1218   MCV 85 09/23/2013 0443   MCH 24.6 (L) 09/27/2018 0024   MCHC 30.1 09/27/2018 0024   RDW 14.6 09/27/2018 0024   RDW 14.5 10/05/2017 1218   RDW 15.4 (H) 09/23/2013 0443   LYMPHSABS 3.3 09/27/2018 0024   LYMPHSABS 1.9 10/05/2017 1218   LYMPHSABS 2.0 09/23/2013 0443   MONOABS 1.4 (H) 09/27/2018 0024   MONOABS 0.4 09/23/2013 0443   EOSABS 0.1 09/27/2018 0024   EOSABS 0.2 10/05/2017 1218   EOSABS 0.1 09/23/2013 0443   BASOSABS 0.1 09/27/2018 0024   BASOSABS 0.0 10/05/2017 1218   BASOSABS 0.1 09/23/2013 0443   Iron/TIBC/Ferritin/ %Sat No results found for: IRON, TIBC, FERRITIN, IRONPCTSAT Lipid Panel     Component Value Date/Time   CHOL 171 08/17/2018 1147   CHOL 183 09/20/2013 0424   TRIG 111 08/17/2018 1147   TRIG 105 09/20/2013 0424   HDL 46 08/17/2018 1147   HDL 40 09/20/2013 0424   CHOLHDL 3.1 03/10/2018 1507   CHOLHDL 3.3 07/21/2017 0854   VLDL 18 07/21/2017 0854   VLDL 21 09/20/2013 0424    LDLCALC 103 (H) 08/17/2018 1147   LDLCALC 122 (H) 09/20/2013 0424   Hepatic Function Panel     Component Value Date/Time   PROT 7.7 11/30/2018 1000   PROT 7.8 03/10/2018 1507   PROT 7.7 09/19/2013 0431   ALBUMIN 3.7 11/30/2018 1000   ALBUMIN 4.5 03/10/2018 1507   ALBUMIN 3.5 09/19/2013 0431   AST 17 11/30/2018 1000   AST 36 09/19/2013 0431   ALT 11 11/30/2018 1000   ALT 32 09/19/2013 0431   ALKPHOS 71 11/30/2018 1000   ALKPHOS 65 09/19/2013 0431   BILITOT 0.4 11/30/2018 1000   BILITOT 0.4 03/10/2018 1507   BILITOT 0.4 09/19/2013 0431   BILIDIR <0.1 (L) 07/21/2017 0854   IBILI NOT CALCULATED 07/21/2017 0854      Component Value Date/Time   TSH 8.532 (H) 09/27/2018 0159   TSH 12.027 (H) 09/22/2018 1047   TSH 3.680 10/05/2017 1218   TSH 5.995 (H) 11/14/2016 1354   TSH 2.90 02/29/2008 1132   Results for JANAYA, BROY (MRN [id number]) as of 01/25/2019 11:18  Ref. Range 08/17/2018 11:47  Vitamin D, 25-Hydroxy Latest Ref Range: 30.0 - 100.0 ng/mL 29.2 (L)   OBESITY BEHAVIORAL INTERVENTION VISIT  Today's visit was #24   Starting weight: 277 lbs Starting date: 10/05/2017 Today's weight: 232 lbs  Today's date: 01/25/2019 Total lbs lost to date: 45 At least 15 minutes were spent on discussing the following behavioral intervention visit.    01/25/2019  Height 5\' 3"  (1.6 m)  Weight 232 lb (105.2 kg)  BMI (Calculated) 41.11  BLOOD PRESSURE - SYSTOLIC 532  BLOOD PRESSURE - DIASTOLIC 79  Body Fat % 44.4 %  Total Body Water (lbs) 81.8 lbs   ASK: We discussed the diagnosis of obesity with [name] today and Joby agreed to give [name] permission to discuss obesity behavioral modification therapy today.  ASSESS: Zahniya has the diagnosis of obesity and her BMI today is 41.1. Tamecka is in the action stage of change.   ADVISE: Terita was educated on the multiple health risks of obesity as well as the benefit of weight loss to improve her health. She was advised of the need for long  term treatment and the importance of lifestyle modifications to improve her current health and to decrease her risk of future health problems.  AGREE: Multiple dietary modification options and treatment options were discussed and  Camera agreed to follow the recommendations documented in the above note.  ARRANGE: Jaiyah was educated on the importance of frequent visits to treat obesity as outlined per CMS and USPSTF guidelines and agreed to schedule her next follow up appointment today.  Migdalia Dk, am acting as transcriptionist for Abby Potash, PA-C I, Abby Potash, PA-C have reviewed above note and agree with its content

## 2019-01-26 ENCOUNTER — Ambulatory Visit (HOSPITAL_COMMUNITY)
Admission: RE | Admit: 2019-01-26 | Discharge: 2019-01-26 | Disposition: A | Discharge status: Home / Self Care | Payer: BC Managed Care – PPO | Source: Ambulatory Visit | Attending: Cardiology | Admitting: Cardiology

## 2019-01-26 ENCOUNTER — Encounter: Payer: Self-pay | Admitting: Cardiology

## 2019-01-26 DIAGNOSIS — I5022 Chronic systolic (congestive) heart failure: Secondary | ICD-10-CM | POA: Diagnosis not present

## 2019-01-26 LAB — LIPID PANEL WITH LDL/HDL RATIO
Cholesterol, Total: 220 mg/dL — ABNORMAL HIGH (ref 100–199)
HDL: 65 mg/dL (ref >39)
LDL Calculated: 135 mg/dL — ABNORMAL HIGH (ref 0–99)
LDl/HDL Ratio: 2.1 ratio (ref 0.0–3.2)
Triglycerides: 99 mg/dL (ref 0–149)
VLDL Cholesterol Cal: 20 mg/dL (ref 5–40)

## 2019-01-26 LAB — VITAMIN D 25 HYDROXY (VIT D DEFICIENCY, FRACTURES): Vit D, 25-Hydroxy: 23 ng/mL — ABNORMAL LOW (ref 30.0–100.0)

## 2019-01-26 LAB — T4, FREE: Free T4: 1.49 ng/dL (ref 0.82–1.77)

## 2019-01-26 LAB — TSH: TSH: 6.7 u[IU]/mL — ABNORMAL HIGH (ref 0.450–4.500)

## 2019-01-26 LAB — DIGOXIN LEVEL: Digoxin Level: <0.2 ng/mL — ABNORMAL LOW (ref 0.8–2.0)

## 2019-01-26 LAB — HEMOGLOBIN A1C
Est. average glucose Bld gHb Est-mCnc: 103 mg/dL
Hgb A1c MFr Bld: 5.2 % (ref 4.8–5.6)

## 2019-01-26 LAB — INSULIN, RANDOM: INSULIN: 26.5 u[IU]/mL — ABNORMAL HIGH (ref 2.6–24.9)

## 2019-01-26 LAB — T3: T3, Total: 104 ng/dL (ref 71–180)

## 2019-01-26 NOTE — Progress Notes (Signed)
Remote ICD transmission.   

## 2019-02-04 ENCOUNTER — Other Ambulatory Visit (HOSPITAL_COMMUNITY): Payer: Self-pay

## 2019-02-04 ENCOUNTER — Telehealth (HOSPITAL_COMMUNITY): Payer: Self-pay

## 2019-02-04 NOTE — Progress Notes (Signed)
Placed order for pre procedure covid test. Pt aware and agreeable to the restrictions afterwards.

## 2019-02-04 NOTE — Telephone Encounter (Signed)
error 

## 2019-02-14 ENCOUNTER — Ambulatory Visit (INDEPENDENT_AMBULATORY_CARE_PROVIDER_SITE_OTHER): Payer: BC Managed Care – PPO

## 2019-02-14 DIAGNOSIS — I5022 Chronic systolic (congestive) heart failure: Secondary | ICD-10-CM

## 2019-02-14 DIAGNOSIS — Z95810 Presence of automatic (implantable) cardiac defibrillator: Secondary | ICD-10-CM

## 2019-02-15 NOTE — Progress Notes (Signed)
EPIC Encounter for ICM Monitoring  Patient Name: [name] is a 48 y.o. female Date: 02/15/2019 Primary Care Physican: [name], MD Primary Cardiologist:McLean Electrophysiologist:Klein 01/21/2019 Weight: 237 lbs 02/16/2019 Weight: 238 lbs  Spoke with patient.  She said she is feeling fine.  She has been eating foods with more salt for the past couple of weeks due to celebrating her birthday.  OptivolThoracic impedanceslightly below baseline normal but trending back toward baseline.  Taking:Torsemide20 mg take 4 tablets (80 mg total)every AM and 2 tablets (40 mg total) alternating every other afternoon. Potassium 20 mEq take4 tablets (80 mEq total) every morning and3 tablets (60 mEq total)every afternoon.  Spironolactone 25 mg take 1 tablet daily.  Metolazone 2.5 mgTake 2.5 mg by mouth daily as needed (for fluid).  Labs: 01/21/2019 Creatinine 1.16, BUN 14, Potassium 3.8, Sodium 138, 11/30/2018 Creatinine 1.07, BUN 13, Potassium 4.3, Sodium 139, GFR >60 10/12/2018 Creatinine 1.15, BUN 11, Potassium 3.6, Sodium 136, GFR 57->60 A complete set of results can be found in Results Review.  Recommendations: Encouraged to call for experiencing fluid symptoms.  Follow-up plan: ICM clinic phone appointment on 04/11/2019.91 day device clinic remote transmission 03/14/2019.  Copy of ICM check sent to [facility].   3 month ICM trend: 02/14/2019    1 Year ICM trend:       [name], RN 02/15/2019 9:54 AM

## 2019-02-17 ENCOUNTER — Other Ambulatory Visit: Payer: Self-pay

## 2019-02-17 ENCOUNTER — Ambulatory Visit (INDEPENDENT_AMBULATORY_CARE_PROVIDER_SITE_OTHER): Payer: BC Managed Care – PPO | Admitting: Physician Assistant

## 2019-02-17 DIAGNOSIS — E559 Vitamin D deficiency, unspecified: Secondary | ICD-10-CM

## 2019-02-17 DIAGNOSIS — Z6841 Body Mass Index (BMI) 40.0 and over, adult: Secondary | ICD-10-CM | POA: Diagnosis not present

## 2019-02-20 NOTE — Progress Notes (Signed)
Office: [phone]  /  Fax: [phone] TeleHealth Visit:  [name] has verbally consented to this TeleHealth visit today. The patient is located at home, the provider is located at the [facility] office. The participants in this visit include the listed provider and patient. The visit was conducted today via webex.  HPI:   Chief Complaint: OBESITY [name] is here to discuss her progress with her obesity treatment plan. She is on the Category 3 plan and is following her eating plan approximately 50-60 % of the time. She states she is walking for 10-15 minutes 7 times per week. [name] reports that she did a lot of birthday celebrating over the last week. She is back on the plan. She reports having to break up her protein at dinner in order to eat all of it. She reports her weight is at 234 lbs. We were unable to weigh the patient today for this TeleHealth visit. She feels as if she has maintained her weight since her last visit. She has lost 45 lbs since starting treatment with [name].  Vitamin D Deficiency [name] has a diagnosis of vitamin D deficiency. She is currently taking prescription Vit D and denies nausea, vomiting or muscle weakness.  ASSESSMENT AND PLAN:  Vitamin D deficiency  Class 3 severe obesity with serious comorbidity and body mass index (BMI) of 40.0 to 44.9 in adult, unspecified obesity type ([place])  PLAN:  Vitamin D Deficiency [name] was informed that low vitamin D levels contributes to fatigue and are associated with obesity, breast, and colon cancer. [name] agrees to continue taking prescription Vit D 50,000 IU every week and will follow up for routine testing of vitamin D, at least 2-3 times per year. She was informed of the risk of over-replacement of vitamin D and agrees to not increase her dose unless she discusses this with [name] first. [name] agrees to follow up with our clinic in 2 weeks.  Obesity [name] is currently in the action stage of change. As  such, her goal is to continue with weight loss efforts She has agreed to follow the Category 3 plan [name] has been instructed to work up to a goal of 150 minutes of combined cardio and strengthening exercise per week for weight loss and overall health benefits. We discussed the following Behavioral Modification Strategies today: work on meal planning and easy cooking plans and keeping healthy foods in the home   [name] has agreed to follow up with our clinic in 2 weeks. She was informed of the importance of frequent follow up visits to maximize her success with intensive lifestyle modifications for her multiple health conditions.  I spent > than 50% of the 25 minute visit on counseling as documented in the note.    ALLERGIES: No Known Allergies  MEDICATIONS: Current Outpatient Medications on File Prior to Visit  Medication Sig Dispense Refill  . amiodarone (PACERONE) 200 MG tablet TAKE 1 TABLET BY MOUTH 2 TIMES DAILY FOR10 DAYS, THEN 1 TABLET (200 MG TOTAL) DAILY 40 tablet 2  . atorvastatin (LIPITOR) 40 MG tablet TAKE 1 TABLET BY MOUTH ONCE DAILY 30 tablet 5  . carvedilol (COREG) 25 MG tablet TAKE 1 TABLET BY MOUTH TWICE A DAY WITH A MEAL (Patient taking differently: Take 25 mg by mouth 2 (two) times daily with a meal. ) 60 tablet 6  . digoxin (LANOXIN) 0.125 MG tablet TAKE 1 TABLET BY MOUTH ONCE DAILY 30 tablet 5  . ENTRESTO 24-26 MG TAKE 1  TABLET BY MOUTH TWICE (2) DAILY 60 tablet 5  . ivabradine (CORLANOR) 5 MG TABS tablet Take 1 tablet (5 mg total) by mouth 2 (two) times daily with a meal. 60 tablet 6  . levothyroxine (SYNTHROID) 25 MCG tablet Take 1 tablet (25 mcg total) by mouth daily before breakfast. 90 tablet 3  . metFORMIN (GLUCOPHAGE) 500 MG tablet Take 1 tablet (500 mg total) by mouth daily with breakfast. 30 tablet 1  . metolazone (ZAROXOLYN) 2.5 MG tablet Take 1 tablet (2.5 mg total) by mouth daily. (Patient taking differently: Take 2.5 mg by mouth daily as needed (for fluid).  ) 5 tablet 0  . potassium chloride SA (K-DUR) 20 MEQ tablet Take 4 tablets (80 mEq total) by mouth every morning AND 3 tablets (60 mEq total) every evening. 180 tablet 5  . spironolactone (ALDACTONE) 25 MG tablet Take 1 tablet (25 mg total) by mouth at bedtime. 30 tablet 6  . torsemide (DEMADEX) 20 MG tablet Take 4 tabs every AM and 2 tabs in PM every other day 120 tablet 5  . Vitamin D, Ergocalciferol, (DRISDOL) 1.25 MG (50000 UT) CAPS capsule Take 1 capsule (50,000 Units total) by mouth every 7 (seven) days. 4 capsule 0   No current facility-administered medications on file prior to visit.     PAST MEDICAL HISTORY: Past Medical History:  Diagnosis Date  . Automatic implantable cardioverter-defibrillator in situ   . Chronic CHF (congestive heart failure) (HCC)    a. EF 15-20% b. RHC (09/2013) RA 14, RV 57/22, PA 64/36 (48), PCWP 18, FIck CO/CI 3.7/1.6, PVR 8.1 WU, PA sat 47%   . History of stomach ulcers   . Hypertension   . Hypotension   . Hypothyroidism   . Morbid obesity ([place])   . Myocardial infarction ([place]) 08/2013  . Nocturnal dyspnea   . Nonischemic cardiomyopathy ([place])   . Sinus tachycardia   . Snoring-prob OSA 09/04/2011  . Sprint Fidelis ICD lead RECALL  [id number]     PAST SURGICAL HISTORY: Past Surgical History:  Procedure Laterality Date  . BREATH TEK H PYLORI N/A 11/09/2014   Procedure: BREATH TEK H PYLORI;  Surgeon: Greer Pickerel, MD;  Location: [facility];  Service: General;  Laterality: N/A;  . CARDIAC CATHETERIZATION  ~ 2006; 09/2013  . CARDIAC CATHETERIZATION N/A 05/23/2016   Procedure: Right Heart Cath;  Surgeon: Larey Dresser, MD;  Location: [facility];  Service: Cardiovascular;  Laterality: N/A;  . CARDIAC DEFIBRILLATOR PLACEMENT  2006; 12/26/2013   Medtronic [id number]; 12-2013 ICD gen change and RV lead revision with new [id number] RV lead by Dr [name]  . CESAREAN SECTION  1999  . IMPLANTABLE CARDIOVERTER DEFIBRILLATOR GENERATOR CHANGE N/A 12/26/2013    Procedure: IMPLANTABLE CARDIOVERTER DEFIBRILLATOR GENERATOR CHANGE;  Surgeon: Deboraha Sprang, MD;  Location: [facility] CATH LAB;  Service: Cardiovascular;  Laterality: N/A;  . LEAD REVISION N/A 12/26/2013   Procedure: LEAD REVISION;  Surgeon: Deboraha Sprang, MD;  Location: [facility] CATH LAB;  Service: Cardiovascular;  Laterality: N/A;  . RIGHT HEART CATHETERIZATION N/A 02/15/2014   Procedure: RIGHT HEART CATH;  Surgeon: Larey Dresser, MD;  Location: [facility] CATH LAB;  Service: Cardiovascular;  Laterality: N/A;  . TUBAL LIGATION  1999    SOCIAL HISTORY: Social History   Tobacco Use  . Smoking status: Never Smoker  . Smokeless tobacco: Never Used  Substance Use Topics  . Alcohol use: No  . Drug use: No    FAMILY HISTORY: Family History  Problem Relation Age of Onset  . Heart disease Maternal Grandmother   . Heart disease Mother   . Obesity Mother   . High blood pressure Father   . Stroke Father     ROS: Review of Systems  Constitutional: Negative for weight loss.  Gastrointestinal: Negative for nausea and vomiting.  Musculoskeletal:       Negative muscle weakness     PHYSICAL EXAM: Pt in no acute distress  RECENT LABS AND TESTS: BMET    Component Value Date/Time   NA 138 01/21/2019 1120   NA 137 03/10/2018 1507   NA 138 09/24/2013 0536   K 3.8 01/21/2019 1120   K 3.7 09/24/2013 0536   CL 104 01/21/2019 1120   CL 107 09/24/2013 0536   CO2 24 01/21/2019 1120   CO2 25 09/24/2013 0536   GLUCOSE 98 01/21/2019 1120   GLUCOSE 82 09/24/2013 0536   BUN 14 01/21/2019 1120   BUN 10 03/10/2018 1507   BUN 11 09/24/2013 0536   CREATININE 1.16 (H) 01/21/2019 1120   CREATININE 1.08 09/24/2013 0536   CALCIUM 9.3 01/21/2019 1120   CALCIUM 8.6 09/24/2013 0536   GFRNONAA 56 (L) 01/21/2019 1120   GFRNONAA >60 09/24/2013 0536   GFRAA >60 01/21/2019 1120   GFRAA >60 09/24/2013 0536   Lab Results  Component Value Date   HGBA1C 5.2 01/25/2019   HGBA1C 5.4 08/17/2018   HGBA1C 5.4 03/10/2018    HGBA1C 5.6 10/05/2017   HGBA1C 5.9 09/19/2013   Lab Results  Component Value Date   INSULIN 26.5 (H) 01/25/2019   INSULIN 15.6 08/17/2018   INSULIN 7.9 03/10/2018   INSULIN 22.5 10/05/2017   CBC    Component Value Date/Time   WBC 12.8 (H) 09/27/2018 0024   RBC 5.44 (H) 09/27/2018 0024   HGB 13.4 09/27/2018 0024   HGB 13.6 10/05/2017 1218   HCT 44.5 09/27/2018 0024   HCT 41.7 10/05/2017 1218   PLT 343 09/27/2018 0024   PLT 195 09/23/2013 0443   MCV 81.8 09/27/2018 0024   MCV 86 10/05/2017 1218   MCV 85 09/23/2013 0443   MCH 24.6 (L) 09/27/2018 0024   MCHC 30.1 09/27/2018 0024   RDW 14.6 09/27/2018 0024   RDW 14.5 10/05/2017 1218   RDW 15.4 (H) 09/23/2013 0443   LYMPHSABS 3.3 09/27/2018 0024   LYMPHSABS 1.9 10/05/2017 1218   LYMPHSABS 2.0 09/23/2013 0443   MONOABS 1.4 (H) 09/27/2018 0024   MONOABS 0.4 09/23/2013 0443   EOSABS 0.1 09/27/2018 0024   EOSABS 0.2 10/05/2017 1218   EOSABS 0.1 09/23/2013 0443   BASOSABS 0.1 09/27/2018 0024   BASOSABS 0.0 10/05/2017 1218   BASOSABS 0.1 09/23/2013 0443   Iron/TIBC/Ferritin/ %Sat No results found for: IRON, TIBC, FERRITIN, IRONPCTSAT Lipid Panel     Component Value Date/Time   CHOL 220 (H) 01/25/2019 0954   CHOL 183 09/20/2013 0424   TRIG 99 01/25/2019 0954   TRIG 105 09/20/2013 0424   HDL 65 01/25/2019 0954   HDL 40 09/20/2013 0424   CHOLHDL 3.1 03/10/2018 1507   CHOLHDL 3.3 07/21/2017 0854   VLDL 18 07/21/2017 0854   VLDL 21 09/20/2013 0424   LDLCALC 135 (H) 01/25/2019 0954   LDLCALC 122 (H) 09/20/2013 0424   Hepatic Function Panel     Component Value Date/Time   PROT 7.7 11/30/2018 1000   PROT 7.8 03/10/2018 1507   PROT 7.7 09/19/2013 0431   ALBUMIN 3.7 11/30/2018 1000   ALBUMIN 4.5 03/10/2018 1507  ALBUMIN 3.5 09/19/2013 0431   AST 17 11/30/2018 1000   AST 36 09/19/2013 0431   ALT 11 11/30/2018 1000   ALT 32 09/19/2013 0431   ALKPHOS 71 11/30/2018 1000   ALKPHOS 65 09/19/2013 0431   BILITOT 0.4  11/30/2018 1000   BILITOT 0.4 03/10/2018 1507   BILITOT 0.4 09/19/2013 0431   BILIDIR <0.1 (L) 07/21/2017 0854   IBILI NOT CALCULATED 07/21/2017 0854      Component Value Date/Time   TSH 6.700 (H) 01/25/2019 0954   TSH 8.532 (H) 09/27/2018 0159   TSH 12.027 (H) 09/22/2018 1047   TSH 3.680 10/05/2017 1218   TSH 5.995 (H) 11/14/2016 1354   TSH 2.90 02/29/2008 1132      I, Trixie Dredge, am acting as transcriptionist for Abby Potash, PA-C I, Abby Potash, PA-C have reviewed above note and agree with its content

## 2019-02-21 ENCOUNTER — Other Ambulatory Visit (HOSPITAL_COMMUNITY)
Admission: RE | Admit: 2019-02-21 | Discharge: 2019-02-21 | Disposition: A | Discharge status: Home / Self Care | Payer: BC Managed Care – PPO | Source: Ambulatory Visit | Attending: Cardiology | Admitting: Cardiology

## 2019-02-21 DIAGNOSIS — Z20828 Contact with and (suspected) exposure to other viral communicable diseases: Secondary | ICD-10-CM | POA: Diagnosis not present

## 2019-02-21 DIAGNOSIS — Z01812 Encounter for preprocedural laboratory examination: Secondary | ICD-10-CM | POA: Diagnosis not present

## 2019-02-21 LAB — SARS CORONAVIRUS 2 (TAT 6-24 HRS): SARS Coronavirus 2: NEGATIVE

## 2019-02-24 ENCOUNTER — Ambulatory Visit (HOSPITAL_COMMUNITY): Payer: BC Managed Care – PPO | Attending: Internal Medicine

## 2019-02-24 ENCOUNTER — Other Ambulatory Visit (HOSPITAL_COMMUNITY): Payer: Self-pay | Admitting: *Deleted

## 2019-02-24 ENCOUNTER — Other Ambulatory Visit: Payer: Self-pay

## 2019-02-24 DIAGNOSIS — I5022 Chronic systolic (congestive) heart failure: Secondary | ICD-10-CM | POA: Insufficient documentation

## 2019-03-04 ENCOUNTER — Telehealth (HOSPITAL_COMMUNITY): Payer: Self-pay

## 2019-03-04 NOTE — Telephone Encounter (Signed)
-----   Message from Larey Dresser, MD sent at 02/24/2019  3:41 PM EDT ----- Primarily limited due to body habitus.

## 2019-03-04 NOTE — Telephone Encounter (Signed)
Pt aware of results.  Advised to continue taking medications as instructed and following at MD visits. Pt appreciative.

## 2019-03-14 ENCOUNTER — Ambulatory Visit: Payer: BC Managed Care – PPO | Admitting: *Deleted

## 2019-03-14 DIAGNOSIS — I428 Other cardiomyopathies: Secondary | ICD-10-CM

## 2019-03-14 LAB — CUP PACEART REMOTE DEVICE CHECK
Battery Remaining Longevity: 81 mo
Battery Voltage: 2.99 V
Brady Statistic RV Percent Paced: 0.01 %
Date Time Interrogation Session: 20200921052203
HighPow Impedance: 83 Ohm
Implantable Lead Implant Date: 20150706
Implantable Lead Location: 753860
Implantable Pulse Generator Implant Date: 20150706
Lead Channel Impedance Value: 551 Ohm
Lead Channel Impedance Value: 665 Ohm
Lead Channel Pacing Threshold Amplitude: 0.375 V
Lead Channel Pacing Threshold Pulse Width: 0.4 ms
Lead Channel Sensing Intrinsic Amplitude: 10.125 mV
Lead Channel Sensing Intrinsic Amplitude: 10.125 mV
Lead Channel Setting Pacing Amplitude: 2 V
Lead Channel Setting Pacing Pulse Width: 0.4 ms
Lead Channel Setting Sensing Sensitivity: 0.3 mV

## 2019-03-15 ENCOUNTER — Other Ambulatory Visit (HOSPITAL_COMMUNITY): Payer: Self-pay | Admitting: Cardiology

## 2019-03-21 ENCOUNTER — Encounter: Payer: Self-pay | Admitting: Cardiology

## 2019-03-21 NOTE — Progress Notes (Signed)
Remote ICD transmission.   

## 2019-04-11 ENCOUNTER — Ambulatory Visit (INDEPENDENT_AMBULATORY_CARE_PROVIDER_SITE_OTHER): Payer: BC Managed Care – PPO

## 2019-04-11 DIAGNOSIS — I5022 Chronic systolic (congestive) heart failure: Secondary | ICD-10-CM

## 2019-04-11 DIAGNOSIS — Z95810 Presence of automatic (implantable) cardiac defibrillator: Secondary | ICD-10-CM | POA: Diagnosis not present

## 2019-04-12 NOTE — Progress Notes (Signed)
EPIC Encounter for ICM Monitoring  Patient Name: [name] is a 48 y.o. female Date: 04/12/2019 Primary Care Physican: [name], MD Primary Cardiologist:[place] Electrophysiologist:Klein 04/12/2019 Weight: 245 lbs  Spoke with patient.  She said she is feeling fine.  She has gained a couple of pounds but will call back if weight continues to increase.  OptivolThoracic impedancenormal.  Taking:Torsemide20 mg take 4 tablets (80 mg total)every AM and 2 tablets (40 mg total) alternating every other afternoon. Potassium 20 mEq take4 tablets (80 mEq total) every morning and3 tablets (60 mEq total)every afternoon.  Spironolactone 25 mg take 1 tablet daily.  Metolazone 2.5 mgTake 2.5 mg by mouth daily as needed (for fluid).  Labs: 01/21/2019 Creatinine 1.16, BUN 14, Potassium 3.8, Sodium 138, 11/30/2018 Creatinine 1.07, BUN 13, Potassium 4.3, Sodium 139, GFR >60 10/12/2018 Creatinine 1.15, BUN 11, Potassium 3.6, Sodium 136, GFR 57->60 A complete set of results can be found in Results Review.  Recommendations: No changes and encouraged to call if experiencing any fluid symptoms.  Follow-up plan: ICM clinic phone appointment on 05/16/2019.   91 day device clinic remote transmission 06/13/2019.  Office appt 04/27/2019 with Dr. [name].    Copy of ICM check sent to Dr. [name].   3 month ICM trend: 04/11/2019    1 Year ICM trend:       [name], RN 04/12/2019 12:40 PM

## 2019-04-27 ENCOUNTER — Encounter (HOSPITAL_COMMUNITY): Payer: BC Managed Care – PPO | Admitting: Cardiology

## 2019-05-16 ENCOUNTER — Ambulatory Visit (INDEPENDENT_AMBULATORY_CARE_PROVIDER_SITE_OTHER): Payer: BC Managed Care – PPO

## 2019-05-16 DIAGNOSIS — I5022 Chronic systolic (congestive) heart failure: Secondary | ICD-10-CM | POA: Diagnosis not present

## 2019-05-16 DIAGNOSIS — Z95810 Presence of automatic (implantable) cardiac defibrillator: Secondary | ICD-10-CM | POA: Diagnosis not present

## 2019-05-16 NOTE — Progress Notes (Signed)
EPIC Encounter for ICM Monitoring  Patient Name: [name] is a 48 y.o. female Date: 05/16/2019 Primary Care Physican: [name], MD Primary Cardiologist:[place] Electrophysiologist:Klein 04/12/2019 Weight: 245 lbs  Spoke with patient and she is asymptomatic.  OptivolThoracic impedancenormal.  Taking:Torsemide20 mg take 4 tablets (80 mg total)every AM and2 tablets (40 mgtotal)alternating every other afternoon. Potassium 20 mEq take4 tablets (80 mEq total) every morning and3 tablets (60 mEq total)every afternoon.  Spironolactone 25 mg take 1 tablet daily.  Metolazone 2.5 mgTake 2.5 mg by mouth daily as needed (for fluid).  Labs: 01/21/2019 Creatinine 1.16, BUN 14, Potassium 3.8, Sodium 138, 11/30/2018 Creatinine 1.07, BUN 13, Potassium 4.3, Sodium 139, GFR >60 10/12/2018 Creatinine 1.15, BUN 11, Potassium 3.6, Sodium 136, GFR 57->60 A complete set of results can be found in Results Review.  Recommendations:  No changes and encouraged to call if experiencing any fluid symptoms.  Follow-up plan: ICM clinic phone appointment on 06/21/2019.   91 day device clinic remote transmission 06/20/2019.  Office appt 06/28/2019 with Dr. [name].    Copy of ICM check sent to Dr. [name].   3 month ICM trend: 05/16/2019    1 Year ICM trend:       [name], RN 05/16/2019 4:08 PM

## 2019-05-23 ENCOUNTER — Other Ambulatory Visit (HOSPITAL_COMMUNITY): Payer: Self-pay

## 2019-05-23 MED ORDER — POTASSIUM CHLORIDE CRYS ER 20 MEQ PO TBCR: EXTENDED_RELEASE_TABLET | ORAL | 5 refills | Status: DC

## 2019-06-13 ENCOUNTER — Other Ambulatory Visit (HOSPITAL_COMMUNITY): Payer: Self-pay | Admitting: Cardiology

## 2019-06-14 ENCOUNTER — Telehealth (HOSPITAL_COMMUNITY): Payer: Self-pay

## 2019-06-14 MED ORDER — TORSEMIDE 20 MG PO TABS: ORAL_TABLET | ORAL | 2 refills | Status: DC

## 2019-06-14 NOTE — Telephone Encounter (Signed)
error 

## 2019-06-20 ENCOUNTER — Ambulatory Visit (INDEPENDENT_AMBULATORY_CARE_PROVIDER_SITE_OTHER): Payer: BC Managed Care – PPO | Admitting: *Deleted

## 2019-06-20 DIAGNOSIS — I5022 Chronic systolic (congestive) heart failure: Secondary | ICD-10-CM

## 2019-06-20 LAB — CUP PACEART REMOTE DEVICE CHECK
Battery Remaining Longevity: 71 mo
Battery Voltage: 2.99 V
Brady Statistic RV Percent Paced: 0.01 %
Date Time Interrogation Session: 20201228033523
HighPow Impedance: 66 Ohm
Implantable Lead Implant Date: 20150706
Implantable Lead Location: 753860
Implantable Pulse Generator Implant Date: 20150706
Lead Channel Impedance Value: 456 Ohm
Lead Channel Impedance Value: 551 Ohm
Lead Channel Pacing Threshold Amplitude: 0.5 V
Lead Channel Pacing Threshold Pulse Width: 0.4 ms
Lead Channel Sensing Intrinsic Amplitude: 10.125 mV
Lead Channel Sensing Intrinsic Amplitude: 10.125 mV
Lead Channel Setting Pacing Amplitude: 2 V
Lead Channel Setting Pacing Pulse Width: 0.4 ms
Lead Channel Setting Sensing Sensitivity: 0.3 mV

## 2019-06-21 ENCOUNTER — Ambulatory Visit (INDEPENDENT_AMBULATORY_CARE_PROVIDER_SITE_OTHER): Payer: BC Managed Care – PPO

## 2019-06-21 DIAGNOSIS — Z95810 Presence of automatic (implantable) cardiac defibrillator: Secondary | ICD-10-CM

## 2019-06-21 DIAGNOSIS — I5022 Chronic systolic (congestive) heart failure: Secondary | ICD-10-CM | POA: Diagnosis not present

## 2019-06-22 NOTE — Progress Notes (Signed)
EPIC Encounter for ICM Monitoring  Patient Name: [name] is a 48 y.o. female Date: 06/22/2019 Primary Care Physican: [name], MD Primary Cardiologist:McLean Electrophysiologist:Klein 06/22/2019 Weight: 245 lbs  Spoke with patient and she is asymptomatic for fluid accumulation.  She reports eating holiday foods that may not be low salt but plans on getting back on her diet after the holiday.   OptivolThoracic impedancenormal.  Taking:Torsemide20 mg take 4 tablets (80 mg total)every AM and2 tablets (40 mgtotal)alternating every other afternoon. Potassium 20 mEq take4 tablets (80 mEq total) every morning and3 tablets (60 mEq total)every afternoon.  Spironolactone 25 mg take 1 tablet daily.  Metolazone 2.5 mgTake 2.5 mg by mouth daily as needed (for fluid).  Labs: 01/21/2019 Creatinine 1.16, BUN 14, Potassium 3.8, Sodium 138 11/30/2018 Creatinine 1.07, BUN 13, Potassium 4.3, Sodium 139, GFR >60 10/12/2018 Creatinine 1.15, BUN 11, Potassium 3.6, Sodium 136, GFR 57->60 A complete set of results can be found in Results Review.  Recommendations:  Encouraged to limit salt intake and call if experiencing any fluid symptoms.   Follow-up plan: ICM clinic phone appointment on 07/25/2019.   91 day device clinic remote transmission 09/19/2019.  Office appt 06/28/2019 with Dr. [name].   3 month ICM trend: 06/20/2019    1 Year ICM trend:       [name], RN 06/22/2019 4:19 PM

## 2019-06-24 DIAGNOSIS — I639 Cerebral infarction, unspecified: Secondary | ICD-10-CM

## 2019-06-24 HISTORY — DX: Cerebral infarction, unspecified: I63.9

## 2019-06-28 ENCOUNTER — Ambulatory Visit (HOSPITAL_COMMUNITY)
Admission: RE | Admit: 2019-06-28 | Discharge: 2019-06-28 | Disposition: A | Discharge status: Home / Self Care | Payer: BC Managed Care – PPO | Source: Ambulatory Visit | Attending: Cardiology | Admitting: Cardiology

## 2019-06-28 ENCOUNTER — Other Ambulatory Visit: Payer: Self-pay

## 2019-06-28 VITALS — BP 111/46 | HR 78 | Wt 260.4 lb

## 2019-06-28 DIAGNOSIS — R0609 Other forms of dyspnea: Secondary | ICD-10-CM | POA: Insufficient documentation

## 2019-06-28 DIAGNOSIS — I110 Hypertensive heart disease with heart failure: Secondary | ICD-10-CM | POA: Insufficient documentation

## 2019-06-28 DIAGNOSIS — I272 Pulmonary hypertension, unspecified: Secondary | ICD-10-CM | POA: Insufficient documentation

## 2019-06-28 DIAGNOSIS — I493 Ventricular premature depolarization: Secondary | ICD-10-CM

## 2019-06-28 DIAGNOSIS — Z6841 Body Mass Index (BMI) 40.0 and over, adult: Secondary | ICD-10-CM | POA: Diagnosis not present

## 2019-06-28 DIAGNOSIS — Z79899 Other long term (current) drug therapy: Secondary | ICD-10-CM | POA: Diagnosis not present

## 2019-06-28 DIAGNOSIS — I428 Other cardiomyopathies: Secondary | ICD-10-CM | POA: Diagnosis not present

## 2019-06-28 DIAGNOSIS — E876 Hypokalemia: Secondary | ICD-10-CM | POA: Insufficient documentation

## 2019-06-28 DIAGNOSIS — I5022 Chronic systolic (congestive) heart failure: Secondary | ICD-10-CM | POA: Diagnosis not present

## 2019-06-28 DIAGNOSIS — E039 Hypothyroidism, unspecified: Secondary | ICD-10-CM | POA: Insufficient documentation

## 2019-06-28 DIAGNOSIS — Z95810 Presence of automatic (implantable) cardiac defibrillator: Secondary | ICD-10-CM | POA: Diagnosis not present

## 2019-06-28 DIAGNOSIS — E785 Hyperlipidemia, unspecified: Secondary | ICD-10-CM | POA: Diagnosis not present

## 2019-06-28 DIAGNOSIS — Z8249 Family history of ischemic heart disease and other diseases of the circulatory system: Secondary | ICD-10-CM | POA: Insufficient documentation

## 2019-06-28 LAB — COMPREHENSIVE METABOLIC PANEL
ALT: 19 U/L (ref 0–44)
AST: 18 U/L (ref 15–41)
Albumin: 3.5 g/dL (ref 3.5–5.0)
Alkaline Phosphatase: 63 U/L (ref 38–126)
Anion gap: 10 (ref 5–15)
BUN: 13 mg/dL (ref 6–20)
CO2: 25 mmol/L (ref 22–32)
Calcium: 9.2 mg/dL (ref 8.9–10.3)
Chloride: 106 mmol/L (ref 98–111)
Creatinine, Ser: 0.99 mg/dL (ref 0.44–1.00)
GFR calc Af Amer: >60 mL/min (ref >60)
GFR calc non Af Amer: >60 mL/min (ref >60)
Glucose, Bld: 95 mg/dL (ref 70–99)
Potassium: 3.8 mmol/L (ref 3.5–5.1)
Sodium: 141 mmol/L (ref 135–145)
Total Bilirubin: 0.2 mg/dL — ABNORMAL LOW (ref 0.3–1.2)
Total Protein: 7.6 g/dL (ref 6.5–8.1)

## 2019-06-28 LAB — TSH: TSH: 5.075 u[IU]/mL — ABNORMAL HIGH (ref 0.350–4.500)

## 2019-06-28 MED ORDER — SACUBITRIL-VALSARTAN 49-51 MG PO TABS: 1.0000 | ORAL_TABLET | Freq: Two times a day (BID) | ORAL | 5 refills | Status: DC

## 2019-06-28 NOTE — Patient Instructions (Addendum)
INCREASE Entresto to 49/51mg  (1 tab) twice a day  Labs today and repeat in 10 days We will only contact you if something comes back abnormal or we need to make some changes. Otherwise no news is good news!\  Your provider has recommended that  you wear a Zio Patch for 3 days.  This monitor will record your heart rhythm for our review.  IF you have any symptoms while wearing the monitor please press the button.  If you have any issues with the patch or you notice a red or orange light on it please call the company at [phone].  Once you remove the patch please mail it back to the company as soon as possible so we can get the results.    Your physician has requested that you have an echocardiogram. Echocardiography is a painless test that uses sound waves to create images of your heart. It provides your doctor with information about the size and shape of your heart and how well your heart's chambers and valves are working. This procedure takes approximately one hour. There are no restrictions for this procedure.  Your physician recommends that you schedule a follow-up appointment in: 3 months with an ECHO.  PLEASE CALL DR Olin Pia OFFICE TO MAKE AN APPOINTMENT.  Please call office at [phone] option 2 if you have any questions or concerns.   At the [facility], you and your health needs are our priority. As part of our continuing mission to provide you with exceptional heart care, we have created designated Provider Care Teams. These Care Teams include your primary Cardiologist (physician) and Advanced Practice Providers (APPs- Physician Assistants and Nurse Practitioners) who all work together to provide you with the care you need, when you need it.   You may see any of the following providers on your designated Care Team at your next follow up: Marland Kitchen Dr Glori Bickers . Dr [name] . Darrick Grinder, NP . Lyda Jester, PA . Audry Riles, PharmD   Please be sure  to bring in all your medications bottles to every appointment.

## 2019-06-28 NOTE — Progress Notes (Signed)
Date:  10/05/2018   ID:  [name], DOB 1970/09/01, MRN [id number]    Provider location: [facility] Type of Visit: Established patient   PCP:  [name], MD    Cardiologist:  [name], MD  Chief Complaint: Exertional dyspnea.   History of Present Illness: [name] is a 49 y.o. 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  She was followed for systolic CHF initially in [place]. Apparently her EF recovered to 35-40%. However, she was admitted to [facility] 09/19/13 for worsening dyspnea and CP. Coronaries were reportedly OK on LHC at that time at [facility]. Echo showed EF of 15-20% with four-chamber enlargement with moderate-severe TR/MR. RHC as below. Rheumatological w/u negative.  CPX in 9/16 showed near-normal functional capacity.  Repeat echo in 6/17 shows that EF remains 20%. Repeat CPX in 1/18 was submaximal but probably only mild circulatory limitation.  Echo 8/18 showed EF 25-30%, severe LV dilation.  CPX in 6/19 showed low normal functional capacity.  Echo in 10/19 showed EF 20-25%, moderate LV dilation.   Zio patch in 2/20 with 11% PVCs and NSVT, amiodarone started. Zio patch in 3/20 with 13.3% PVCs and NSVT.  She saw Dr. [name], he thought that increased PVCs were electromechanical in nature due to worsening heart failure.   She was admitted in 4/20 with VT in setting of hypokalemia.    CPX in 9/20 showed mild functional limitation, more due to body habitus than heart failure.   She returns for followup of CHF.  Weight is up 23 lbs.  She feels like she has been eating more, attributes it to her thyroid. She is on Levoxyl but not clear that anyone is monitoring this.  She is walking more, no dyspnea walking on flat ground or walking around grocery store or [place].  Chronic orthopnea, sleeps on an incline.  No lightheadedness.  She  continues to feel palpitations.   Medtronic device interrogation: fluid index < threshold, no VT.    RHC Procedural Findings: RA mean 5 RV 55/8 PA 56/31, mean 40 PCWP mean 24 Oxygen saturations: PA 61% AO 99% Cardiac Output (Fick) 4.3  Cardiac Index (Fick) 2.0 PVR 3.7 WU  RHC 09/24/13  RA 14  RV 57/22  PA 64/36 (48)  PCWP 18  Fick CO/CI: 3.7/1.6  PVR 8.1 WU  Ao sat 95%  PA sat 47%   CPX 06/27/2016  Peak VO2: 13.5 (56.2% predicted peak VO2) VE/VCO2 slope: 33 OUES: 2.16 Peak RER: 0.98  CPX  1/18 Peak VO2 13.5 VE/VCO2 slope 33 RER 0.98 Submaximal study, mild circulatory limitation, suspect more limited by body habitus.   Echo (8/15) with EF 20%, moderate LV dilation, diffuse hypokinesis, normal RV size with mildly decreased systolic function, mild MR.  Echo (5/16) with EF 20%, spherical LV with diffuse hypokinesis, mild MR.  Echo (6/17) with EF 20%, diffuse hypokinesis, moderate LV dilation, normal RV, mild to moderate MR.  Echo (8/18) with EF 25-30%, severe LV dilation, severe LAE.  Echo (2/20) with EF 20-25%, moderate LV dilation, moderate MR.   Sleep study did not show significant OSA.   RHC (8/15): Mean RA 3 PA 33/11 mean 19 Mean PCWP 8 CI 2.35  Holter (8/15) with < 1% PVCs  CPX 9/16 Peak VO2 16.2 VE/VCO2 slope 23.6 RER 1.25 Normal functional capacity  CPX (6/19): peak VO2 14.3 (83%  predicted), VE/VCO2 slope 30 => low normal functional capacity.   CPX (9/20): peak VO2 14.2, VE/VCO2 slope 29, RER 1.13 => mild functional limitation due to body habitus and heart failure.   Labs: (11/09/13): Dig level 0.5, K 3.6, Creatinine 0.83, pro-BNP 622 (12/19/13): K 3.6, creatinine 0.8 (7/15): K 4.1, creatinine 0.91 (8/15): K 3.9, creatinine 0.91 (10/15): K 4, creatinine 0.93, proBNP 129 (12/15): K 4, creatinine 0.9, digoxin 0.4 (5/16): K 4, creatinine 0.86 (9/16): K 3.6, creatinine 0.86, digoxin < 0.2 (5/17): K 4.1, creatinine 0.95, digoxin  0.3 (5/18): K 3.4, creatinine 0.95, TSH elevated but free T3 and free T4 normal.  (8/18): K 3.9, creatinine 0.87, BNP 108  (11/18): K 3.5, creatinine 1.04, digoxin 0.4 (1/19): LDL 91, HDL 47 (3/19): digoxin 0.7, K 4, creatinine 0.99 (4/19): TSH normal, LDL 95 (9/19): LDL 99, HDL 45, K 4, creatinine 0.96 (10/19): K 3.6, creatinine 0.85, digoxin 0.5 (2/20): LDL 103 (3/20): K 3.9, creatinine 1.05 (4/20): K 3.9, creatinine 1.23 => 1.15, TSH 8, digoxin 0.3, LFTs normal 6/20): K 4.3, creatinine 1.07, LFTs normal, digoxin 0.5 (7/20): K 3.8, creatinine 1.16 (8/20): digoxin < 0.2, LDL 135  Current Outpatient Medications  Medication Sig Dispense Refill  . amiodarone (PACERONE) 200 MG tablet Take 1 tablet (200 mg total) by mouth daily. 90 tablet 3  . atorvastatin (LIPITOR) 40 MG tablet TAKE 1 TABLET BY MOUTH ONCE DAILY 30 tablet 5  . carvedilol (COREG) 25 MG tablet TAKE 1 TABLET BY MOUTH TWICE A DAY WITH A MEAL 180 tablet 3  . CORLANOR 5 MG TABS tablet TAKE 1 TABLET BY MOUTH TWICE (2) DAILY WITH MEALS 60 tablet 6  . digoxin (LANOXIN) 0.125 MG tablet TAKE 1 TABLET BY MOUTH ONCE DAILY 30 tablet 5  . levothyroxine (SYNTHROID) 25 MCG tablet Take 1 tablet (25 mcg total) by mouth daily before breakfast. 90 tablet 3  . metolazone (ZAROXOLYN) 2.5 MG tablet Take 2.5 mg by mouth as needed (extra fluid as directed by HF clinic).    . potassium chloride SA (KLOR-CON) 20 MEQ tablet Take 4 tablets (80 mEq total) by mouth every morning AND 3 tablets (60 mEq total) every evening. 180 tablet 5  . spironolactone (ALDACTONE) 25 MG tablet Take 1 tablet (25 mg total) by mouth at bedtime. 30 tablet 6  . torsemide (DEMADEX) 20 MG tablet Take 4 tablets by mouth every morning and 2 tablets by mouth every evening. Take an extra tab as needed    . Vitamin D, Ergocalciferol, (DRISDOL) 1.25 MG (50000 UT) CAPS capsule Take 1 capsule (50,000 Units total) by mouth every 7 (seven) days. 4 capsule 0  . sacubitril-valsartan  (ENTRESTO) 49-51 MG Take 1 tablet by mouth 2 (two) times daily. 60 tablet 5   No current facility-administered medications for this encounter.    Allergies:   Patient has no known allergies.   Social History:  The patient  reports that she has never smoked. She has never used smokeless tobacco. She reports that she does not drink alcohol or use drugs.   Family History:  The patient's family history includes Heart disease in her maternal grandmother and mother; High blood pressure in her father; Obesity in her mother; Stroke in her father.   ROS:  Please see the history of present illness.   All other systems are personally reviewed and negative.   Exam:   BP (!) 111/46   Pulse 78   Wt 118.1 kg (260 lb 6.4 oz)   SpO2 100%  BMI 46.13 kg/m  General: NAD, obese Neck: No JVD, no thyromegaly or thyroid nodule.  Lungs: Clear to auscultation bilaterally with normal respiratory effort. CV: Nondisplaced PMI.  Heart regular S1/S2, no S3/S4, no murmur.  No peripheral edema.  No carotid bruit.  Normal pedal pulses.  Abdomen: Soft, nontender, no hepatosplenomegaly, no distention.  Skin: Intact without lesions or rashes.  Neurologic: Alert and oriented x 3.  Psych: Normal affect. Extremities: No clubbing or cyanosis.  HEENT: Normal.   Recent Labs: 09/27/2018: B Natriuretic Peptide 404.7; Hemoglobin 13.4; Platelets 343 10/12/2018: Magnesium 1.9 06/28/2019: ALT 19; BUN 13; Creatinine, Ser 0.99; Potassium 3.8; Sodium 141; TSH 5.075  Personally reviewed   Wt Readings from Last 3 Encounters:  06/28/19 118.1 kg (260 lb 6.4 oz)  01/25/19 105.2 kg (232 lb)  01/21/19 107.6 kg (237 lb 3.2 oz)      ASSESSMENT AND PLAN:  1. Chronic systolic HF: Nonischemic cardiomyopathy.  She has a Medtronic ICD.  Cause for cardiomyopathy is uncertain: initially noted peri-partum (after son's birth), so cannot rule out peri-partum CMP.  On and off, she has had frequent PVCs.   RHC in 8/15 showed normal filling  pressures and preserved cardiac output.  She does not qualify for CRT with narrow QRS.  CPX 9/20 with mildly decreased functional capacity due to body habitus and HF.  Last echo in 10/19 showed EF 20-25% with moderate LV dilation and moderate MR. She does not look volume overloaded on exam and thoracic impedance stable on Medtronic device. NYHA class II.  I think that her weight gain is more likely to be caloric than due to HF.  - Continue torsemide 80 qam/40 qpm and KCl 80 qam/60 qpm.  BMET today.    - Increase Entresto to 49/51 bid, BMET 10 days.    - Continue Coreg 25 mg bid and ivabradine 5 mg bid.   - Continue spironolactone 25 mg daily and digoxin, check digoxin level today.   - Repeat echo at followup in 3 months.  2. Pulmonary HTN: Mixed picture with PVR 8.1 on RHC at [facility].  However, repeat study in 8/15 here showed no significant pulmonary hypertension.  3. Morbid obesity: Continue to work on weight loss.  [facility]  4. Hyperlipidemia: Good lipids in 9/19.   5. PVCs: Worsened with 11% PVCs + NSVT on 2/20 Zio patch.  Amiodarone started, but PVCs up to 13.3% with NSVT on 3/20 Zio patch. She saw Dr. [name] who thought that the ventricular arrhythmias were electromechanical due to worsening HF.  In 4/20, she had VT x 2 with ICD discharges in the setting of hypokalemia.  - Continue amiodarone, check LFTs and TSH today.  She will need a regular eye exam with amiodarone use.  - Repeat Zio patch x 3 days to quantify PVCs.  She needs followup with Dr. [name].  6. Hypothyroidism: She is on levothyroxine now. TSH today.   Recommended follow-up:  3 months with echo  Signed, [name], MD  06/28/2019  [facility] [address] [facility] [place] [address] [phone] (office) [phone] (fax)

## 2019-07-04 ENCOUNTER — Other Ambulatory Visit: Payer: Self-pay

## 2019-07-04 ENCOUNTER — Ambulatory Visit (INDEPENDENT_AMBULATORY_CARE_PROVIDER_SITE_OTHER): Payer: BC Managed Care – PPO | Admitting: Physician Assistant

## 2019-07-04 VITALS — BP 94/62 | HR 69 | Temp 98.1°F | Ht 63.0 in | Wt 255.0 lb

## 2019-07-04 DIAGNOSIS — E7849 Other hyperlipidemia: Secondary | ICD-10-CM | POA: Diagnosis not present

## 2019-07-04 DIAGNOSIS — E8881 Metabolic syndrome: Secondary | ICD-10-CM

## 2019-07-04 DIAGNOSIS — E559 Vitamin D deficiency, unspecified: Secondary | ICD-10-CM | POA: Diagnosis not present

## 2019-07-04 DIAGNOSIS — Z6841 Body Mass Index (BMI) 40.0 and over, adult: Secondary | ICD-10-CM | POA: Diagnosis not present

## 2019-07-04 DIAGNOSIS — R739 Hyperglycemia, unspecified: Secondary | ICD-10-CM

## 2019-07-05 LAB — VITAMIN D 25 HYDROXY (VIT D DEFICIENCY, FRACTURES): Vit D, 25-Hydroxy: 14.4 ng/mL — ABNORMAL LOW (ref 30.0–100.0)

## 2019-07-05 LAB — LIPID PANEL WITH LDL/HDL RATIO
Cholesterol, Total: 262 mg/dL — ABNORMAL HIGH (ref 100–199)
HDL: 76 mg/dL (ref >39)
LDL Chol Calc (NIH): 171 mg/dL — ABNORMAL HIGH (ref 0–99)
LDL/HDL Ratio: 2.3 ratio (ref 0.0–3.2)
Triglycerides: 87 mg/dL (ref 0–149)
VLDL Cholesterol Cal: 15 mg/dL (ref 5–40)

## 2019-07-05 LAB — INSULIN, RANDOM: INSULIN: 19.8 u[IU]/mL (ref 2.6–24.9)

## 2019-07-05 LAB — HEMOGLOBIN A1C
Est. average glucose Bld gHb Est-mCnc: 105 mg/dL
Hgb A1c MFr Bld: 5.3 % (ref 4.8–5.6)

## 2019-07-06 NOTE — Progress Notes (Signed)
Chief Complaint:   OBESITY [name] is here to discuss her progress with her obesity treatment plan along with follow-up of her obesity related diagnoses. [name] is on the Category 3 Plan and states she is following her eating plan approximately 0% of the time. [name] states she is walking 15 to 20 minutes 4 times per week.  Today's visit was #: 28 Starting weight: 277 lbs Starting date: 10/05/2017 Today's weight: 255 lbs Today's date: 07/04/2019 Total lbs lost to date: 22 Total lbs lost since last in-office visit: 0  Interim History: [name] states she has not had an appetite in the last few months. She has all the food needed for the plan, but she is not eating it.  Subjective:   Other hyperlipidemia [name] is on Lipitor. She has no chest pain or myalgias. Her last total cholesterol was 220 and LDL was 135 (01/25/19).  Vitamin D deficiency  [name] is on OTC vitamin D 5,000 IU daily, and she missed the last week. She has no nausea, vomiting or muscle weakness. Her last vitamin D level was 23.0 (01/25/19). Labs were discussed with patient today.  Hyperglycemia [name] previously had an elevated level of blood sugar.  Insulin resistance [name]'s last insulin level was 26.5 (01/25/19). She has no cravings. [name] is not on medications currently.  Assessment/Plan:   Other hyperlipidemia  Cardiovascular risk and specific lipid/LDL goals reviewed.  We discussed several lifestyle modifications today and [name] will continue with medications and weight loss efforts. Orders and follow up as documented in patient record.   Counseling Intensive lifestyle modifications are the first line treatment for this issue. . Dietary changes: Increase soluble fiber. Decrease simple carbohydrates. . Exercise changes: Moderate to vigorous-intensity aerobic activity 150 minutes per week if tolerated. . Lipid-lowering medications: see documented in medical record.  Vitamin D deficiency Low Vitamin D level  contributes to fatigue and are associated with obesity, breast, and colon cancer. Candle will continue to take OTC vitamin D supplementation and she will follow-up for routine testing of vitamin D, at least 2-3 times per year to avoid over-replacement. We will check vitamin D level today.  Hyperglycemia We will check A1c today and results with be discussed with [name] in 2 weeks at her follow up visit. In the meanwhile Shondra was started on a lower simple carbohydrate diet and will work on weight loss efforts.  Insulin resistance Bryona will continue to work on weight loss, exercise, and decreasing simple carbohydrates to help decrease the risk of diabetes. Rand agreed to follow-up with [name] as directed to closely monitor her progress.  Obesity [name] is currently in the action stage of change. As such, her goal is to continue with weight loss efforts. She has agreed to on the Category 3 Plan.   We discussed the following exercise goals today: For substantial health benefits, adults should do at least 150 minutes (2 hours and 30 minutes) a week of moderate-intensity, or 75 minutes (1 hour and 15 minutes) a week of vigorous-intensity aerobic physical activity, or an equivalent combination of moderate- and vigorous-intensity aerobic activity. Aerobic activity should be performed in episodes of at least 10 minutes, and preferably, it should be spread throughout the week. Adults should also include muscle-strengthening activities that involve all major muscle groups on 2 or more days a week.  We discussed the following behavioral modification strategies today: no skipping meals and meal planning and cooking strategies.  [name] has agreed to follow-up with our  clinic in 2 weeks (fasting for indirect calorimeter). She was informed of the importance of frequent follow-up visits to maximize her success with intensive lifestyle modifications for her multiple health conditions.   [name] was informed we would  discuss her lab results at her next visit unless there is a critical issue that needs to be addressed sooner. [name] agreed to keep her next visit at the agreed upon time to discuss these results.  Objective:   Blood pressure 94/62, pulse 69, temperature 98.1 F (36.7 C), temperature source Oral, height 5\' 3"  (1.6 m), weight 255 lb (115.7 kg), last menstrual period 06/24/2019, SpO2 100 %. Body mass index is 45.17 kg/m.  General: Cooperative, alert, well developed, in no acute distress. HEENT: Conjunctivae and lids unremarkable. Neck: No thyromegaly.  Cardiovascular: Regular rhythm.  Lungs: Normal work of breathing. Extremities: No edema.  Neurologic: No focal deficits.   Lab Results  Component Value Date   CREATININE 0.99 06/28/2019   BUN 13 06/28/2019   NA 141 06/28/2019   K 3.8 06/28/2019   CL 106 06/28/2019   CO2 25 06/28/2019   Lab Results  Component Value Date   ALT 19 06/28/2019   AST 18 06/28/2019   ALKPHOS 63 06/28/2019   BILITOT 0.2 (L) 06/28/2019   Lab Results  Component Value Date   HGBA1C 5.3 07/04/2019   HGBA1C 5.2 01/25/2019   HGBA1C 5.4 08/17/2018   HGBA1C 5.4 03/10/2018   HGBA1C 5.6 10/05/2017   Lab Results  Component Value Date   INSULIN 19.8 07/04/2019   INSULIN 26.5 (H) 01/25/2019   INSULIN 15.6 08/17/2018   INSULIN 7.9 03/10/2018   INSULIN 22.5 10/05/2017   Lab Results  Component Value Date   TSH 5.075 (H) 06/28/2019   Lab Results  Component Value Date   CHOL 262 (H) 07/04/2019   HDL 76 07/04/2019   LDLCALC 171 (H) 07/04/2019   TRIG 87 07/04/2019   CHOLHDL 3.1 03/10/2018   Lab Results  Component Value Date   WBC 12.8 (H) 09/27/2018   HGB 13.4 09/27/2018   HCT 44.5 09/27/2018   MCV 81.8 09/27/2018   PLT 343 09/27/2018   No results found for: IRON, TIBC, FERRITIN   Ref. Range 01/25/2019 09:54  Vitamin D, 25-Hydroxy Latest Ref Range: 30.0 - 100.0 ng/mL 23.0 (L)    Obesity Behavioral Intervention Documentation for Insurance:    Approximately 15 minutes were spent on the discussion below.  ASK: We discussed the diagnosis of obesity with [name] today and Marshelle agreed to give [name] permission to discuss obesity behavioral modification therapy today.  ASSESS: Jaelle has the diagnosis of obesity and her BMI today is 45.18. Kaysia is in the action stage of change.   ADVISE: Amiayah was educated on the multiple health risks of obesity as well as the benefit of weight loss to improve her health. She was advised of the need for long term treatment and the importance of lifestyle modifications to improve her current health and to decrease her risk of future health problems.  AGREE: Multiple dietary modification options and treatment options were discussed and Aubre agreed to follow the recommendations documented in the above note.  ARRANGE: Deborahh was educated on the importance of frequent visits to treat obesity as outlined per CMS and USPSTF guidelines and agreed to schedule her next follow up appointment today.  Attestation Statements:   Reviewed by clinician on day of visit: allergies, medications, problem list, medical history, surgical history, family history, social history, and  previous encounter notes.  Corey Skains, am acting as Location manager for [employer], PA-C.  I have reviewed the above documentation for accuracy and completeness, and I agree with the above. Abby Potash, PA-C

## 2019-07-08 ENCOUNTER — Ambulatory Visit (HOSPITAL_COMMUNITY)
Admission: RE | Admit: 2019-07-08 | Discharge: 2019-07-08 | Disposition: A | Discharge status: Home / Self Care | Payer: BC Managed Care – PPO | Source: Ambulatory Visit | Attending: Cardiology | Admitting: Cardiology

## 2019-07-08 ENCOUNTER — Other Ambulatory Visit: Payer: Self-pay

## 2019-07-08 DIAGNOSIS — I5022 Chronic systolic (congestive) heart failure: Secondary | ICD-10-CM | POA: Diagnosis not present

## 2019-07-08 LAB — BASIC METABOLIC PANEL
Anion gap: 9 (ref 5–15)
BUN: 20 mg/dL (ref 6–20)
CO2: 23 mmol/L (ref 22–32)
Calcium: 9.1 mg/dL (ref 8.9–10.3)
Chloride: 103 mmol/L (ref 98–111)
Creatinine, Ser: 1.05 mg/dL — ABNORMAL HIGH (ref 0.44–1.00)
GFR calc Af Amer: >60 mL/min (ref >60)
GFR calc non Af Amer: >60 mL/min (ref >60)
Glucose, Bld: 92 mg/dL (ref 70–99)
Potassium: 3.9 mmol/L (ref 3.5–5.1)
Sodium: 135 mmol/L (ref 135–145)

## 2019-07-11 DIAGNOSIS — I493 Ventricular premature depolarization: Secondary | ICD-10-CM | POA: Diagnosis not present

## 2019-07-13 ENCOUNTER — Other Ambulatory Visit (HOSPITAL_COMMUNITY): Payer: Self-pay | Admitting: Cardiology

## 2019-07-14 ENCOUNTER — Other Ambulatory Visit: Payer: Self-pay

## 2019-07-19 NOTE — Progress Notes (Signed)
Cardiology Office Note Date:  07/19/2019  Patient ID:  Hector, Allain 1970/08/19, MRN [id number] PCP:  [name], MD  Cardiologist:  Dr. [name] Electrophysiologist: Dr. [name]   Chief Complaint:  planned follow up (over due)  History of Present Illness: [name] is a 49 y.o. female with history of morbid obesity, HTN, OSA and systolic HF due to nonischemic cardiomyopathy s/p Medtronic ICD (initial implant 2006), PVCs  She was followed for systolic CHF initially in [place]. Apparently her EF recovered to 35-40%. However, she was admitted to [facility] 09/19/13 for worsening dyspnea and CP. Coronaries were reportedly OK on LHC at that time at [facility]. Echo showed EF of 15-20% with four-chamber enlargement with moderate-severe TR/MR. RHC as below. Rheumatological w/u negative.  CPX in 9/16 showed near-normal functional capacity.  Repeat echo in 6/17 shows that EF remains 20%.   She comes in today to be seen for Dr. [name], last seen by him via tele-health visit 09/23/2018, noted "Stable PVC burden but much less noxious appearing than a month ago"  "Reviewing ECG back to 2016 the average burden of ectopy seems about stable, so wonder if the worsening VT might be electromechanical as opposed to causative"  Planned to continue low dose amio.  April 2020 hospitalized for ICD shock in setting of marked hypkalemia (2.2) with recent up-titration in diuretics.  Most recently she saw Dr. [name] 06/28/19, her weight was up 23lbs, though walking without DOE, described choinic orthopnea, and DOE with inclines, pt felt weight gain was diet/thyroid related.  Planned to repeat 3 day Zio patch to revisit her PVC burden.  She is doing well.  She has been walking to get some exercise and feels like she can walk longer, feels better with er walking.  No CP, palpitations or cardiac awareness.  No dizzy spells, near syncope or syncope. No further shocks since last April.  Device history:  MDT single chamber ICD,  gen change and new lead 12/26/13, replacing [id number] lead, original implant 2006, Dr. [name], primary prevention + appropriate HV therapy for PMVT (in setting of marked hypokalemia)  AAD Hx PVCs, NSVT Amiodarone started march/April 2020   Past Medical History:  Diagnosis Date  . Automatic implantable cardioverter-defibrillator in situ   . Chronic CHF (congestive heart failure) (HCC)    a. EF 15-20% b. RHC (09/2013) RA 14, RV 57/22, PA 64/36 (48), PCWP 18, FIck CO/CI 3.7/1.6, PVR 8.1 WU, PA sat 47%   . History of stomach ulcers   . Hypertension   . Hypotension   . Hypothyroidism   . Morbid obesity ([place])   . Myocardial infarction ([place]) 08/2013  . Nocturnal dyspnea   . Nonischemic cardiomyopathy ([place])   . Sinus tachycardia   . Snoring-prob OSA 09/04/2011  . Sprint Fidelis ICD lead RECALL  [id number]     Past Surgical History:  Procedure Laterality Date  . BREATH TEK H PYLORI N/A 11/09/2014   Procedure: BREATH TEK H PYLORI;  Surgeon: Greer Pickerel, MD;  Location: [facility];  Service: General;  Laterality: N/A;  . CARDIAC CATHETERIZATION  ~ 2006; 09/2013  . CARDIAC CATHETERIZATION N/A 05/23/2016   Procedure: Right Heart Cath;  Surgeon: Larey Dresser, MD;  Location: [facility];  Service: Cardiovascular;  Laterality: N/A;  . CARDIAC DEFIBRILLATOR PLACEMENT  2006; 12/26/2013   Medtronic [id number]; 12-2013 ICD gen change and RV lead revision with new [id number] RV lead by Dr [name]  . CESAREAN SECTION  1999  . IMPLANTABLE CARDIOVERTER  DEFIBRILLATOR GENERATOR CHANGE N/A 12/26/2013   Procedure: IMPLANTABLE CARDIOVERTER DEFIBRILLATOR GENERATOR CHANGE;  Surgeon: Deboraha Sprang, MD;  Location: [facility] CATH LAB;  Service: Cardiovascular;  Laterality: N/A;  . LEAD REVISION N/A 12/26/2013   Procedure: LEAD REVISION;  Surgeon: Deboraha Sprang, MD;  Location: [facility] CATH LAB;  Service: Cardiovascular;  Laterality: N/A;  . RIGHT HEART CATHETERIZATION N/A 02/15/2014   Procedure: RIGHT HEART CATH;  Surgeon: Larey Dresser, MD;  Location: [facility] CATH LAB;  Service: Cardiovascular;  Laterality: N/A;  . TUBAL LIGATION  1999    Current Outpatient Medications  Medication Sig Dispense Refill  . amiodarone (PACERONE) 200 MG tablet Take 1 tablet (200 mg total) by mouth daily. 90 tablet 3  . atorvastatin (LIPITOR) 40 MG tablet TAKE 1 TABLET BY MOUTH ONCE A DAY 30 tablet 5  . carvedilol (COREG) 25 MG tablet TAKE 1 TABLET BY MOUTH TWICE A DAY WITH A MEAL 180 tablet 3  . CORLANOR 5 MG TABS tablet TAKE 1 TABLET BY MOUTH TWICE (2) DAILY WITH MEALS 60 tablet 6  . digoxin (LANOXIN) 0.125 MG tablet TAKE 1 TABLET BY MOUTH ONCE A DAY 30 tablet 5  . levothyroxine (SYNTHROID) 25 MCG tablet Take 1 tablet (25 mcg total) by mouth daily before breakfast. 90 tablet 3  . metolazone (ZAROXOLYN) 2.5 MG tablet Take 2.5 mg by mouth as needed (extra fluid as directed by HF clinic).    . potassium chloride SA (KLOR-CON) 20 MEQ tablet Take 4 tablets (80 mEq total) by mouth every morning AND 3 tablets (60 mEq total) every evening. 180 tablet 5  . sacubitril-valsartan (ENTRESTO) 49-51 MG Take 1 tablet by mouth 2 (two) times daily. 60 tablet 5  . spironolactone (ALDACTONE) 25 MG tablet Take 1 tablet (25 mg total) by mouth at bedtime. 30 tablet 6  . torsemide (DEMADEX) 20 MG tablet Take 4 tablets by mouth every morning and 2 tablets by mouth every evening. Take an extra tab as needed     No current facility-administered medications for this visit.    Allergies:   Patient has no known allergies.   Social History:  The patient  reports that she has never smoked. She has never used smokeless tobacco. She reports that she does not drink alcohol or use drugs.   Family History:  The patient's family history includes Heart disease in her maternal grandmother and mother; High blood pressure in her father; Obesity in her mother; Stroke in her father.  ROS:  Please see the history of present illness.    All other systems are reviewed and otherwise  negative.   PHYSICAL EXAM:  VS:  LMP 06/24/2019 (Approximate)  BMI: There is no height or weight on file to calculate BMI. Well nourished, well developed, in no acute distress  HEENT: normocephalic, atraumatic  Neck: no JVD, carotid bruits or masses Cardiac: RRR, no extrasystoles appreciated,  no significant murmurs, no rubs, or gallops Lungs:   CTA b/l, no wheezing, rhonchi or rales  Abd: soft, nontender MS: no deformity or atrophy Ext:  trace edema  Skin: warm and dry, no rash Neuro:  No gross deficits Psych: euthymic mood, full affect  ICD site is stable, no tethering or discomfort   EKG:  Not done today  ICD interrogation done today and reviewed by myself  Battery and lead measurements are good No arrhythmias OptiVol well below threshold HR histogram looks good    07/11/19: 3 day monitor 1. 6.2% PVCs 2. 1 short run  VT.    09/03/2018: ZIO: Dr. [name] PVC percentage up to 13.3% with NSVT runs. Would start on amiodarone 200 mg bid x 14 days then 200 mg daily. Will repeat Zio patch in future on amiodarone to make sure suppressed. I am concerned that PVCs could be contributing to her cardiomyopathy. Given young age, would prefer not to have her on amiodarone long-term. Please refer back over to EP for evaluation of NSVT/PVCs. She has an ICD.  08/19/2018: ZIO Dr. [name] Frequent PVCs, 11% total beats with runs of NSVT. Has ICD. May be playing a role in cardiomyopathy. Would recommend starting amiodarone 200 mg bid x 10 days then 200 mg daily. Will have her repeat Zio patch when amiodarone loaded. Make sure she has followup with EP.  Holter (8/15) with < 1% PVCs  CPX 06/27/2016  Peak VO2: 13.5 (56.2% predicted peak VO2) VE/VCO2 slope: 33 OUES: 2.16 Peak RER: 0.98   RHC 05/23/16: RA mean 5 RV 55/8 PA 56/31, mean 40 PCWP mean 24 Oxygen saturations: PA 61% AO 99% Cardiac Output (Fick) 4.3  Cardiac Index (Fick) 2.0 PVR 3.7 WU  Echo (8/15) with EF 20%,  moderate LV dilation, diffuse hypokinesis, normal RV size with mildly decreased systolic function, mild MR.  Echo (5/16) with EF 20%, spherical LV with diffuse hypokinesis, mild MR.  Echo (6/17) with EF 20%, diffuse hypokinesis, moderate LV dilation, normal RV, mild to moderate MR.   Sleep study did not show significant OSA.   RHC (8/15): Mean RA 3 PA 33/11 mean 19 Mean PCWP 8 CI 2.35  Holter (8/15) with < 1% PVCs  CPX 9/16 Peak VO2 16.2 VE/VCO2 slope 23.6 RER 1.25 Normal functional capacity  Recent Labs: 09/27/2018: B Natriuretic Peptide 404.7; Hemoglobin 13.4; Platelets 343 10/12/2018: Magnesium 1.9 06/28/2019: ALT 19; TSH 5.075 07/08/2019: BUN 20; Creatinine, Ser 1.05; Potassium 3.9; Sodium 135  07/04/2019: Cholesterol, Total 262; HDL 76; LDL Chol Calc (NIH) 171; Triglycerides 87   CrCl cannot be calculated (Unknown ideal weight.).   Wt Readings from Last 3 Encounters:  07/04/19 255 lb (115.7 kg)  06/28/19 260 lb 6.4 oz (118.1 kg)  01/25/19 232 lb (105.2 kg)     Other studies reviewed: Additional studies/records reviewed today include: summarized above  ASSESSMENT AND PLAN:  1. DCM     Etiology is unclear, initially noted peri-partum (after son's birth) 2. Chronic CHF      No symptoms or exam findings to suggest volume OL     She reports chronically sleeping at an incline, no changes or new symptoms     OptiVol looks good     C/w Dr. [name]   3. ICD     intact function     No programming changes made     q 78mo remotes   4. PVC's 5. VT w/shocks in setting of marked hypokalemia April 2020     On amiodarone     LFTs, TSH OK Jan 2021     PVC burden down some     Given young age, would like to be able to get her off amio     She sees Dr. [name] in April, perhaps at year mark, might consider stopping     Follow remotes          Disposition: Q 3 mo remotes, in-clinic with EP in a year, sooner if needed.    Current medicines are reviewed at length with  the patient today.  The patient did not have any concerns regarding  medicines.  Miranda Lasso, PA-C 07/19/2019 10:20 AM     [facility] [address] [place] [address] [phone] (office)  [phone] (fax)

## 2019-07-21 ENCOUNTER — Other Ambulatory Visit: Payer: Self-pay

## 2019-07-21 ENCOUNTER — Ambulatory Visit (INDEPENDENT_AMBULATORY_CARE_PROVIDER_SITE_OTHER): Payer: BC Managed Care – PPO | Admitting: Physician Assistant

## 2019-07-21 VITALS — BP 100/68 | HR 67 | Ht 63.0 in | Wt 256.0 lb

## 2019-07-21 DIAGNOSIS — Z95810 Presence of automatic (implantable) cardiac defibrillator: Secondary | ICD-10-CM | POA: Diagnosis not present

## 2019-07-21 DIAGNOSIS — I472 Ventricular tachycardia, unspecified: Secondary | ICD-10-CM

## 2019-07-21 DIAGNOSIS — I5022 Chronic systolic (congestive) heart failure: Secondary | ICD-10-CM

## 2019-07-21 DIAGNOSIS — I428 Other cardiomyopathies: Secondary | ICD-10-CM

## 2019-07-21 NOTE — Patient Instructions (Signed)
Medication Instructions:   Your physician recommends that you continue on your current medications as directed. Please refer to the Current Medication list given to you today.  *If you need a refill on your cardiac medications before your next appointment, please call your pharmacy*  Lab Work: [place]   If you have labs (blood work) drawn today and your tests are completely normal, you will receive your results only by: Marland Kitchen MyChart Message (if you have MyChart) OR . A paper copy in the mail If you have any lab test that is abnormal or we need to change your treatment, we will call you to review the results.  Testing/Procedures: NONE ORDERED  TODAY    Follow-Up: At [facility], you and your health needs are our priority.  As part of our continuing mission to provide you with exceptional heart care, we have created designated Provider Care Teams.  These Care Teams include your primary Cardiologist (physician) and Advanced Practice Providers (APPs -  Physician Assistants and Nurse Practitioners) who all work together to provide you with the care you need, when you need it.  Your next appointment:   1 year(s)  The format for your next appointment:   In Person  Provider:   You may see Virl Axe, MD or one of the following Advanced Practice Providers on your designated Care Team:    Chanetta Marshall, NP  Tommye Standard, [place]  Legrand Como "[name], [place]   Other Instructions

## 2019-07-25 ENCOUNTER — Ambulatory Visit (INDEPENDENT_AMBULATORY_CARE_PROVIDER_SITE_OTHER): Payer: BC Managed Care – PPO

## 2019-07-25 ENCOUNTER — Other Ambulatory Visit: Payer: Self-pay

## 2019-07-25 ENCOUNTER — Ambulatory Visit (INDEPENDENT_AMBULATORY_CARE_PROVIDER_SITE_OTHER): Payer: BC Managed Care – PPO | Admitting: Physician Assistant

## 2019-07-25 ENCOUNTER — Encounter (INDEPENDENT_AMBULATORY_CARE_PROVIDER_SITE_OTHER): Payer: Self-pay | Admitting: Physician Assistant

## 2019-07-25 VITALS — BP 95/61 | HR 65 | Temp 98.2°F | Ht 63.0 in | Wt 249.0 lb

## 2019-07-25 DIAGNOSIS — E8881 Metabolic syndrome: Secondary | ICD-10-CM | POA: Diagnosis not present

## 2019-07-25 DIAGNOSIS — Z95810 Presence of automatic (implantable) cardiac defibrillator: Secondary | ICD-10-CM

## 2019-07-25 DIAGNOSIS — R0602 Shortness of breath: Secondary | ICD-10-CM

## 2019-07-25 DIAGNOSIS — Z6841 Body Mass Index (BMI) 40.0 and over, adult: Secondary | ICD-10-CM

## 2019-07-25 DIAGNOSIS — E559 Vitamin D deficiency, unspecified: Secondary | ICD-10-CM | POA: Diagnosis not present

## 2019-07-25 DIAGNOSIS — I5022 Chronic systolic (congestive) heart failure: Secondary | ICD-10-CM | POA: Diagnosis not present

## 2019-07-25 MED ORDER — VITAMIN D (ERGOCALCIFEROL) 1.25 MG (50000 UNIT) PO CAPS: 50000.0000 [IU] | ORAL_CAPSULE | ORAL | 0 refills | Status: DC

## 2019-07-25 NOTE — Progress Notes (Signed)
Chief Complaint:   OBESITY [name] is here to discuss her progress with her obesity treatment plan along with follow-up of her obesity related diagnoses. [name] is on the Category 3 Plan and states she is following her eating plan approximately 98% of the time. [name] states she is walking for 20-30 minutes 5 times per week.  Today's visit was #: 47 Starting weight: 277 lbs Starting date: 10/05/17 Today's weight: 249 lbs Today's date: 07/25/2019 Total lbs lost to date: 28 Total lbs lost since last in-office visit: 6  Interim History: [name] reports that she has done a much better job following the plan the last few weeks. She is getting all of her food in and varying her meals to keep it interesting.  Subjective:   1. Vitamin D deficiency [name]'s Vit D is worsening, her last Vit D level was 14.4. She is taking OTC Vit D currently.  2. Shortness of breath on exertion [name] notes shortness of breath with exertion. She denies dizziness or lightheadedness. I discussed labs with the patient today.  3. Insulin resistance [name]'s last insulin was 19.8, it has improved. She denies polyphagia and she is exercising 5 times a week. I discussed labs with the patient today.  Assessment/Plan:   1. Vitamin D deficiency Low Vitamin D level contributes to fatigue and are associated with obesity, breast, and colon cancer. Loralie agreed to start prescription Vitamin D 50,000 IU every week and will follow-up for routine testing of Vitamin D, at least 2-3 times per year to avoid over-replacement. We will continue to monitor.  - Vitamin D, Ergocalciferol, (DRISDOL) 1.25 MG (50000 UNIT) CAPS capsule; Take 1 capsule (50,000 Units total) by mouth every 7 (seven) days.  Dispense: 4 capsule; Refill: 0  2. Shortness of breath on exertion [name] does feel that she gets out of breath more easily that she used to when she exercises. [name]'s shortness of breath appears to be obesity related and exercise induced. She  has agreed to work on weight loss and gradually increase exercise to treat her exercise induced shortness of breath. Will continue to monitor closely.  3. Insulin resistance [name] will continue her meal plan, and will continue walking and working on weight loss and decreasing simple carbohydrates to help decrease the risk of diabetes. [name] agreed to follow-up with [name] as directed to closely monitor her progress.  4. Class 3 severe obesity with serious comorbidity and body mass index (BMI) of 40.0 to 44.9 in adult, unspecified obesity type (HCC) [name] is currently in the action stage of change. As such, her goal is to continue with weight loss efforts. She has agreed to the Category 3 Plan.   IC was rechecked today.  Exercise goals: No exercise has been prescribed at this time.  Behavioral modification strategies: meal planning and cooking strategies.  Tascha has agreed to follow-up with our clinic in 2 weeks. She was informed of the importance of frequent follow-up visits to maximize her success with intensive lifestyle modifications for her multiple health conditions.   Objective:   Blood pressure 95/61, pulse 65, temperature 98.2 F (36.8 C), temperature source Oral, height 5\' 3"  (1.6 m), weight 249 lb (112.9 kg), last menstrual period 07/20/2019, SpO2 99 %. Body mass index is 44.11 kg/m.  General: Cooperative, alert, well developed, in no acute distress. HEENT: Conjunctivae and lids unremarkable. Cardiovascular: Regular rhythm.  Lungs: Normal work of breathing. Neurologic: No focal deficits.   Lab Results  Component Value Date   CREATININE  1.05 (H) 07/08/2019   BUN 20 07/08/2019   NA 135 07/08/2019   K 3.9 07/08/2019   CL 103 07/08/2019   CO2 23 07/08/2019   Lab Results  Component Value Date   ALT 19 06/28/2019   AST 18 06/28/2019   ALKPHOS 63 06/28/2019   BILITOT 0.2 (L) 06/28/2019   Lab Results  Component Value Date   HGBA1C 5.3 07/04/2019   HGBA1C 5.2 01/25/2019     HGBA1C 5.4 08/17/2018   HGBA1C 5.4 03/10/2018   HGBA1C 5.6 10/05/2017   Lab Results  Component Value Date   INSULIN 19.8 07/04/2019   INSULIN 26.5 (H) 01/25/2019   INSULIN 15.6 08/17/2018   INSULIN 7.9 03/10/2018   INSULIN 22.5 10/05/2017   Lab Results  Component Value Date   TSH 5.075 (H) 06/28/2019   Lab Results  Component Value Date   CHOL 262 (H) 07/04/2019   HDL 76 07/04/2019   LDLCALC 171 (H) 07/04/2019   TRIG 87 07/04/2019   CHOLHDL 3.1 03/10/2018   Lab Results  Component Value Date   WBC 12.8 (H) 09/27/2018   HGB 13.4 09/27/2018   HCT 44.5 09/27/2018   MCV 81.8 09/27/2018   PLT 343 09/27/2018   No results found for: IRON, TIBC, FERRITIN  Obesity Behavioral Intervention Documentation for Insurance:   Approximately 15 minutes were spent on the discussion below.  ASK: We discussed the diagnosis of obesity with [name] today and Fayetta agreed to give [name] permission to discuss obesity behavioral modification therapy today.  ASSESS: Ammy has the diagnosis of obesity and her BMI today is 44.12. Cyrilla is in the action stage of change.   ADVISE: Theressa was educated on the multiple health risks of obesity as well as the benefit of weight loss to improve her health. She was advised of the need for long term treatment and the importance of lifestyle modifications to improve her current health and to decrease her risk of future health problems.  AGREE: Multiple dietary modification options and treatment options were discussed and Allee agreed to follow the recommendations documented in the above note.  ARRANGE: Nelida was educated on the importance of frequent visits to treat obesity as outlined per CMS and USPSTF guidelines and agreed to schedule her next follow up appointment today.  Attestation Statements:   Reviewed by clinician on day of visit: allergies, medications, problem list, medical history, surgical history, family history, social history, and previous  encounter notes.   Wilhemena Durie, am acting as transcriptionist for [employer], PA-C.  I have reviewed the above documentation for accuracy and completeness, and I agree with the above. Abby Potash, PA-C

## 2019-07-27 NOTE — Progress Notes (Signed)
EPIC Encounter for ICM Monitoring  Patient Name: [name] is a 49 y.o. female Date: 07/27/2019 Primary Care Physican: [name], MD Primary Cardiologist:[place] Electrophysiologist:Klein 07/27/2019 Weight: 245 lbs  Spoke with patientand she is asymptomatic for fluid accumulation.  OptivolThoracic impedancenormal.  Prescribed:  Torsemide20 mg Take 4 tablets by mouth every morning and 2 tablets by mouth every evening. Take an extra tab as needed  Potassium 20 mEq Take 4 tablets (80 mEq total) by mouth every morning AND 3 tablets (60 mEq total) every evening.  Spironolactone 25 mg take 1 tablet at bedtime.   Metolazone 2.5 mgTake 2.5 mg by mouth as needed (extra fluid as directed by HF clinic).  Labs: 07/08/2019 Creatinine 1.05, BUN 20, Potassium 3.9, Sodium 135, GFR >60 06/28/2019 Creatinine 0.99, BUN 13, Potassium 3.8, Sodium 141, GFR >60  01/21/2019 Creatinine 1.16, BUN 14, Potassium 3.8, Sodium 138 11/30/2018 Creatinine 1.07, BUN 13, Potassium 4.3, Sodium 139, GFR >60 10/12/2018 Creatinine 1.15, BUN 11, Potassium 3.6, Sodium 136, GFR 57->60 A complete set of results can be found in Results Review.  Recommendations:Encouraged to limit salt intake and call if experiencing any fluid symptoms.   Follow-up plan: ICM clinic phone appointment on3/01/2020. 91 day device clinic remote transmission 09/19/2019. Office appt 4/6/2021with Dr. [name].   3 month ICM trend: 07/25/2019    1 Year ICM trend:       [name], RN 07/27/2019 9:09 AM

## 2019-08-08 ENCOUNTER — Other Ambulatory Visit (HOSPITAL_COMMUNITY): Payer: Self-pay | Admitting: *Deleted

## 2019-08-08 MED ORDER — TORSEMIDE 20 MG PO TABS: ORAL_TABLET | ORAL | 3 refills | Status: DC

## 2019-08-09 ENCOUNTER — Encounter (INDEPENDENT_AMBULATORY_CARE_PROVIDER_SITE_OTHER): Payer: Self-pay | Admitting: Physician Assistant

## 2019-08-09 ENCOUNTER — Ambulatory Visit (INDEPENDENT_AMBULATORY_CARE_PROVIDER_SITE_OTHER): Payer: BC Managed Care – PPO | Admitting: Physician Assistant

## 2019-08-09 ENCOUNTER — Other Ambulatory Visit: Payer: Self-pay

## 2019-08-09 VITALS — BP 99/66 | HR 87 | Temp 98.2°F | Ht 63.0 in | Wt 251.0 lb

## 2019-08-09 DIAGNOSIS — E7849 Other hyperlipidemia: Secondary | ICD-10-CM

## 2019-08-09 DIAGNOSIS — Z6841 Body Mass Index (BMI) 40.0 and over, adult: Secondary | ICD-10-CM

## 2019-08-09 DIAGNOSIS — E559 Vitamin D deficiency, unspecified: Secondary | ICD-10-CM | POA: Diagnosis not present

## 2019-08-09 MED ORDER — VITAMIN D (ERGOCALCIFEROL) 1.25 MG (50000 UNIT) PO CAPS: 50000.0000 [IU] | ORAL_CAPSULE | ORAL | 0 refills | Status: DC

## 2019-08-09 NOTE — Progress Notes (Signed)
Chief Complaint:   OBESITY [name] is here to discuss her progress with her obesity treatment plan along with follow-up of her obesity related diagnoses. [name] is on the Category 3 Plan and states she is following her eating plan approximately 90% of the time. [name] states she is exercising 0 minutes 0 times per week.  Today's visit was #: 28 Starting weight: 277 lbs Starting date: 10/05/2017 Today's weight: 251 lbs Today's date: 08/09/2019 Total lbs lost to date: 26 Total lbs lost since last in-office visit: 0  Interim History: [name] is disappointed with the lack of weight loss as she feels that she followed the plan well. She wants to change plans today.  Subjective:   Vitamin D deficiency. [name] is on prescription Vitamin D weekly. No nausea, vomiting, or muscle weakness. Last Vitamin D level 14.4 on 07/04/2019.  Other hyperlipidemia. [name] is on atorvastatin. No chest pain or myalgias. She did not exercise the last 2 weeks due to the weather.  Lab Results  Component Value Date   CHOL 262 (H) 07/04/2019   HDL 76 07/04/2019   LDLCALC 171 (H) 07/04/2019   TRIG 87 07/04/2019   CHOLHDL 3.1 03/10/2018   Lab Results  Component Value Date   ALT 19 06/28/2019   AST 18 06/28/2019   ALKPHOS 63 06/28/2019   BILITOT 0.2 (L) 06/28/2019   The 10-year ASCVD risk score [name], et al., 2013) is: 0.7%   Values used to calculate the score:     Age: 49 years     Sex: Female     Is Non-Hispanic African American: Yes     Diabetic: No     Tobacco smoker: No     Systolic Blood Pressure: 99 mmHg     Is BP treated: Yes     HDL Cholesterol: 76 mg/dL     Total Cholesterol: 262 mg/dL  Assessment/Plan:   Vitamin D deficiency. Low Vitamin D level contributes to fatigue and are associated with obesity, breast, and colon cancer. She was given a refill on her Vitamin D, Ergocalciferol, (DRISDOL) 1.25 MG (50000 UNIT) CAPS capsule every week #4 with 0 refills and will follow-up for  routine testing of Vitamin D, at least 2-3 times per year to avoid over-replacement.    Other hyperlipidemia. Cardiovascular risk and specific lipid/LDL goals reviewed.  We discussed several lifestyle modifications today and [name] will continue to work on diet, start exercising, and continue weight loss efforts. Orders and follow up as documented in patient record.   Counseling Intensive lifestyle modifications are the first line treatment for this issue. . Dietary changes: Increase soluble fiber. Decrease simple carbohydrates. . Exercise changes: Moderate to vigorous-intensity aerobic activity 150 minutes per week if tolerated. . Lipid-lowering medications: see documented in medical record.  Class 3 severe obesity with serious comorbidity and body mass index (BMI) of 40.0 to 44.9 in adult, unspecified obesity type ([name]).  [name] is currently in the action stage of change. As such, her goal is to continue with weight loss efforts. She has agreed to change plans and will now follow a lower carbohydrate, vegetable and lean protein rich diet plan.   Exercise goals: For substantial health benefits, adults should do at least 150 minutes (2 hours and 30 minutes) a week of moderate-intensity, or 75 minutes (1 hour and 15 minutes) a week of vigorous-intensity aerobic physical activity, or an equivalent combination of moderate- and vigorous-intensity aerobic activity. Aerobic activity should be performed in episodes  of at least 10 minutes, and preferably, it should be spread throughout the week.  Behavioral modification strategies: meal planning and cooking strategies and keeping healthy foods in the home.  [name] has agreed to follow-up with our clinic in 2 weeks. She was informed of the importance of frequent follow-up visits to maximize her success with intensive lifestyle modifications for her multiple health conditions.   Objective:   Blood pressure 99/66, pulse 87, temperature 98.2 F (36.8 C),  temperature source Oral, height 5\' 3"  (1.6 m), weight 251 lb (113.9 kg), last menstrual period 07/20/2019, SpO2 100 %. Body mass index is 44.46 kg/m.  General: Cooperative, alert, well developed, in no acute distress. HEENT: Conjunctivae and lids unremarkable. Cardiovascular: Regular rhythm.  Lungs: Normal work of breathing. Neurologic: No focal deficits.   Lab Results  Component Value Date   CREATININE 1.05 (H) 07/08/2019   BUN 20 07/08/2019   NA 135 07/08/2019   K 3.9 07/08/2019   CL 103 07/08/2019   CO2 23 07/08/2019   Lab Results  Component Value Date   ALT 19 06/28/2019   AST 18 06/28/2019   ALKPHOS 63 06/28/2019   BILITOT 0.2 (L) 06/28/2019   Lab Results  Component Value Date   HGBA1C 5.3 07/04/2019   HGBA1C 5.2 01/25/2019   HGBA1C 5.4 08/17/2018   HGBA1C 5.4 03/10/2018   HGBA1C 5.6 10/05/2017   Lab Results  Component Value Date   INSULIN 19.8 07/04/2019   INSULIN 26.5 (H) 01/25/2019   INSULIN 15.6 08/17/2018   INSULIN 7.9 03/10/2018   INSULIN 22.5 10/05/2017   Lab Results  Component Value Date   TSH 5.075 (H) 06/28/2019   Lab Results  Component Value Date   CHOL 262 (H) 07/04/2019   HDL 76 07/04/2019   LDLCALC 171 (H) 07/04/2019   TRIG 87 07/04/2019   CHOLHDL 3.1 03/10/2018   Lab Results  Component Value Date   WBC 12.8 (H) 09/27/2018   HGB 13.4 09/27/2018   HCT 44.5 09/27/2018   MCV 81.8 09/27/2018   PLT 343 09/27/2018   No results found for: IRON, TIBC, FERRITIN  Obesity Behavioral Intervention Documentation for Insurance:   Approximately 15 minutes were spent on the discussion below.  ASK: We discussed the diagnosis of obesity with [name] today and [name] agreed to give [name] permission to discuss obesity behavioral modification therapy today.  ASSESS: [name] has the diagnosis of obesity and her BMI today is 44.5. [name] is in the action stage of change.   ADVISE: [name] was educated on the multiple health risks of obesity as well as the  benefit of weight loss to improve her health. She was advised of the need for long term treatment and the importance of lifestyle modifications to improve her current health and to decrease her risk of future health problems.  AGREE: Multiple dietary modification options and treatment options were discussed and [name] agreed to follow the recommendations documented in the above note.  ARRANGE: [name] was educated on the importance of frequent visits to treat obesity as outlined per CMS and USPSTF guidelines and agreed to schedule her next follow up appointment today.  Attestation Statements:   Reviewed by clinician on day of visit: allergies, medications, problem list, medical history, surgical history, family history, social history, and previous encounter notes.  IMichaelene Song, am acting as transcriptionist for Abby Potash, PA-C   I have reviewed the above documentation for accuracy and completeness, and I agree with the above. Abby Potash, PA-C

## 2019-08-12 ENCOUNTER — Encounter: Payer: BC Managed Care – PPO | Admitting: Student

## 2019-08-12 ENCOUNTER — Other Ambulatory Visit (HOSPITAL_COMMUNITY): Payer: Self-pay | Admitting: Cardiology

## 2019-08-25 NOTE — Progress Notes (Deleted)
Electrophysiology Office Note Date: 08/25/2019  ID:  [name], DOB 07-Jan-1971, MRN [id number]  PCP: [name], MD Primary Cardiologist: [name], MD Electrophysiologist: Virl Axe, MD   CC: Routine ICD follow-up  [name] is a 49 y.o. female seen today for Dr. [name].  They present today to discuss barostim consideration. Since last being seen in our clinic, the patient reports doing very well. They deny chest pain, palpitations, dyspnea, PND, orthopnea, nausea, vomiting, dizziness, syncope, edema, weight gain, or early satiety.  {He/she (caps):30048} has not had ICD shocks.   Device History: MDT single chamber ICD, gen change and new lead 12/26/13, replacing 6949 lead, original implant 2006, Dr. [name], primary prevention + appropriate HV therapy for PMVT (in setting of marked hypokalemia)   Past Medical History:  Diagnosis Date  . Automatic implantable cardioverter-defibrillator in situ   . Chronic CHF (congestive heart failure) (HCC)    a. EF 15-20% b. RHC (09/2013) RA 14, RV 57/22, PA 64/36 (48), PCWP 18, FIck CO/CI 3.7/1.6, PVR 8.1 WU, PA sat 47%   . History of stomach ulcers   . Hypertension   . Hypotension   . Hypothyroidism   . Morbid obesity ([place])   . Myocardial infarction ([place]) 08/2013  . Nocturnal dyspnea   . Nonischemic cardiomyopathy ([place])   . Sinus tachycardia   . Snoring-prob OSA 09/04/2011  . Sprint Fidelis ICD lead RECALL  [id number]    Past Surgical History:  Procedure Laterality Date  . BREATH TEK H PYLORI N/A 11/09/2014   Procedure: BREATH TEK H PYLORI;  Surgeon: Greer Pickerel, MD;  Location: [facility];  Service: General;  Laterality: N/A;  . CARDIAC CATHETERIZATION  ~ 2006; 09/2013  . CARDIAC CATHETERIZATION N/A 05/23/2016   Procedure: Right Heart Cath;  Surgeon: Larey Dresser, MD;  Location: [facility];  Service: Cardiovascular;  Laterality: N/A;  . CARDIAC DEFIBRILLATOR PLACEMENT  2006; 12/26/2013   Medtronic [id number]; 12-2013  ICD gen change and RV lead revision with new [id number] RV lead by Dr [name]  . CESAREAN SECTION  1999  . IMPLANTABLE CARDIOVERTER DEFIBRILLATOR GENERATOR CHANGE N/A 12/26/2013   Procedure: IMPLANTABLE CARDIOVERTER DEFIBRILLATOR GENERATOR CHANGE;  Surgeon: Deboraha Sprang, MD;  Location: [facility] LAB;  Service: Cardiovascular;  Laterality: N/A;  . LEAD REVISION N/A 12/26/2013   Procedure: LEAD REVISION;  Surgeon: Deboraha Sprang, MD;  Location: [facility] C[facility] LAB;  Service: Cardiovascular;  Laterality: N/A;  . RIGHT HEART CATHETERIZATION N/A 02/15/2014   Procedure: RIGHT HEART CATH;  Surgeon: Larey Dresser, MD;  Location: [facility] CATH LAB;  Service: Cardiovascular;  Laterality: N/A;  . TUBAL LIGATION  1999    Current Outpatient Medications  Medication Sig Dispense Refill  . amiodarone (PACERONE) 200 MG tablet Take 1 tablet (200 mg total) by mouth daily. 90 tablet 3  . atorvastatin (LIPITOR) 40 MG tablet TAKE 1 TABLET BY MOUTH ONCE A DAY 30 tablet 5  . carvedilol (COREG) 25 MG tablet TAKE 1 TABLET BY MOUTH TWICE A DAY WITH A MEAL 180 tablet 3  . CORLANOR 5 MG TABS tablet TAKE 1 TABLET BY MOUTH TWICE (2) DAILY WITH MEALS 60 tablet 6  . digoxin (LANOXIN) 0.125 MG tablet TAKE 1 TABLET BY MOUTH ONCE A DAY 30 tablet 5  . levothyroxine (SYNTHROID) 25 MCG tablet Take 1 tablet (25 mcg total) by mouth daily before breakfast. 90 tablet 3  . metolazone (ZAROXOLYN) 2.5 MG tablet Take 2.5 mg by mouth as needed (extra  fluid as directed by HF clinic).    . potassium chloride SA (KLOR-CON) 20 MEQ tablet TAKE 2 TABLETS BY MOUTH DAILY 60 tablet 5  . sacubitril-valsartan (ENTRESTO) 49-51 MG Take 1 tablet by mouth 2 (two) times daily. 60 tablet 5  . spironolactone (ALDACTONE) 25 MG tablet Take 1 tablet (25 mg total) by mouth at bedtime. 30 tablet 6  . torsemide (DEMADEX) 20 MG tablet Take 4 tablets (80 mg total) by mouth every morning AND 2 tablets (40 mg total) every evening. 180 tablet 3  . Vitamin D, Ergocalciferol, (DRISDOL) 1.25 MG  (50000 UNIT) CAPS capsule Take 1 capsule (50,000 Units total) by mouth every 7 (seven) days. 4 capsule 0   No current facility-administered medications for this visit.    Allergies:   Patient has no known allergies.   Social History: Social History   Socioeconomic History  . Marital status: Married    Spouse name: Mileidy Grandinetti  . Number of children: 2  . Years of education: Not on file  . Highest education level: Not on file  Occupational History  . 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 Smoking status: Never Smoker  . Smokeless tobacco: Never Used  Substance and Sexual Activity  . Alcohol use: No  . Drug use: No  . Sexual activity: Yes  Other Topics Concern  . Not on file  Social History Narrative  . Not on file   Social Determinants of Health   Financial Resource Strain:   . Difficulty of Paying Living Expenses: Not on file  Food Insecurity:   . Worried About Charity fundraiser in the Last Year: Not on file  . Ran Out of Food in the Last Year: Not on file  Transportation Needs:   . Lack of Transportation (Medical): Not on file  . Lack of Transportation (Non-Medical): Not on file  Physical Activity:   . Days of Exercise per Week: Not on file  . Minutes of Exercise per Session: Not on file  Stress:   . Feeling of Stress : Not on file  Social Connections:   . Frequency of Communication with Friends and Family: Not on file  . Frequency of Social Gatherings with Friends and Family: Not on file  . Attends Religious Services: Not on file  . Active Member of Clubs or Organizations: Not on file  . Attends Archivist Meetings: Not on file  . Marital Status: Not on file  Intimate Partner Violence:   . Fear of Current or Ex-Partner: Not on file  . Emotionally Abused: Not on file  . Physically Abused: Not on file  . Sexually Abused: Not on file    Family History: Family History  Problem Relation Age of Onset  . Heart disease  Maternal Grandmother   . Heart disease Mother   . Obesity Mother   . High blood pressure Father   . Stroke Father     Review of Systems: All other systems reviewed and are otherwise negative except as noted above.   Physical Exam: There were no vitals filed for this visit.   GEN- The patient is well appearing, alert and oriented x 3 today.   HEENT: normocephalic, atraumatic; sclera clear, conjunctiva pink; hearing intact; oropharynx clear; neck supple, no JVP Lymph- no cervical lymphadenopathy Lungs- Clear to ausculation bilaterally, normal work of breathing.  No wheezes, rales, rhonchi Heart- Regular rate and rhythm, no murmurs, rubs or gallops, PMI not laterally displaced  GI- soft, non-tender, non-distended, bowel sounds present, no hepatosplenomegaly Extremities- no clubbing, cyanosis, or edema; DP/PT/radial pulses 2+ bilaterally MS- no significant deformity or atrophy Skin- warm and dry, no rash or lesion; ICD pocket well healed Psych- euthymic mood, full affect Neuro- strength and sensation are intact  ICD interrogation- reviewed in detail from previous visit 07/21/19,  See PACEART report  EKG:  EKG is not ordered today. The ekg ordered today shows ***  Recent Labs: 09/27/2018: B Natriuretic Peptide 404.7; Hemoglobin 13.4; Platelets 343 10/12/2018: Magnesium 1.9 06/28/2019: ALT 19; TSH 5.075 07/08/2019: BUN 20; Creatinine, Ser 1.05; Potassium 3.9; Sodium 135   Wt Readings from Last 3 Encounters:  08/09/19 251 lb (113.9 kg)  07/25/19 249 lb (112.9 kg)  07/21/19 256 lb (116.1 kg)     Other studies Reviewed: Additional studies/ records that were reviewed today include: most recent EP office notes, most recent HF notes,    Assessment and Plan:  1.  Chronic systolic dysfunction s/p {Blank single:19197::"Medtronic","St. Jude","Boston Scientific","Biotronik"} {Blank single:19197::"single chamber ICD","dual chamber ICD","CRT-D","S-ICD"}  euvolemic today Stable on an  appropriate medical regimen Normal ICD function See Pace Art report No changes today   Current medicines are reviewed at length with the patient today.   The patient {ACTIONS; HAS/DOES NOT HAVE:19233} concerns regarding her medicines.  The following changes were made today:  {NONE DEFAULTED:18576::"none"}  Labs/ tests ordered today include: *** No orders of the defined types were placed in this encounter.    Disposition:   Follow up with *** {gen number [id number] {TIME; UNITS DAY/WEEK/MONTH:19136}   Signed, Shirley Friar, PA-C  08/25/2019 10:38 AM  [facility] [address] [place] [address] [phone] (office) [phone] (fax)

## 2019-08-26 ENCOUNTER — Encounter: Payer: BC Managed Care – PPO | Admitting: Student

## 2019-08-29 ENCOUNTER — Other Ambulatory Visit: Payer: Self-pay

## 2019-08-29 ENCOUNTER — Ambulatory Visit (INDEPENDENT_AMBULATORY_CARE_PROVIDER_SITE_OTHER): Payer: BC Managed Care – PPO | Admitting: Physician Assistant

## 2019-08-29 ENCOUNTER — Encounter (INDEPENDENT_AMBULATORY_CARE_PROVIDER_SITE_OTHER): Payer: Self-pay | Admitting: Physician Assistant

## 2019-08-29 VITALS — BP 90/61 | HR 84 | Temp 98.1°F | Ht 63.0 in | Wt 248.0 lb

## 2019-08-29 DIAGNOSIS — Z6841 Body Mass Index (BMI) 40.0 and over, adult: Secondary | ICD-10-CM

## 2019-08-29 DIAGNOSIS — E7849 Other hyperlipidemia: Secondary | ICD-10-CM | POA: Diagnosis not present

## 2019-08-29 DIAGNOSIS — E559 Vitamin D deficiency, unspecified: Secondary | ICD-10-CM

## 2019-08-29 DIAGNOSIS — Z9189 Other specified personal risk factors, not elsewhere classified: Secondary | ICD-10-CM | POA: Diagnosis not present

## 2019-08-29 MED ORDER — VITAMIN D (ERGOCALCIFEROL) 1.25 MG (50000 UNIT) PO CAPS: 50000.0000 [IU] | ORAL_CAPSULE | ORAL | 0 refills | Status: DC

## 2019-08-29 NOTE — Progress Notes (Signed)
Chief Complaint:   OBESITY [name] is here to discuss her progress with her obesity treatment plan along with follow-up of her obesity related diagnoses. [name] is on following a lower carbohydrate, vegetable and lean protein rich diet plan and states she is following her eating plan approximately 100% of the time. [name] states she is walking for 30 minutes 3 times per week.  Today's visit was #: 27 Starting weight: 277 lbs Starting date: 10/05/2017 Today's weight: 248 lbs Today's date: 08/29/2019 Total lbs lost to date: 29 lbs Total lbs lost since last in-office visit: 3 lbs  Interim History: [name] reports increased energy and sleeping better due to being on the low carb plan.  She would like to continue for the next 2 weeks.  Subjective:   1. Vitamin D deficiency Harry's Vitamin D level was 14.4 on 07/04/2019. She is currently taking vit D. She denies nausea, vomiting or muscle weakness.  2. Other hyperlipidemia [name] has hyperlipidemia and has been trying to improve her cholesterol levels with intensive lifestyle modification including a low saturated fat diet, exercise and weight loss. She denies any chest pain, claudication or myalgias.  She is taking atorvastatin.  Lab Results  Component Value Date   ALT 19 06/28/2019   AST 18 06/28/2019   ALKPHOS 63 06/28/2019   BILITOT 0.2 (L) 06/28/2019   Lab Results  Component Value Date   CHOL 262 (H) 07/04/2019   HDL 76 07/04/2019   LDLCALC 171 (H) 07/04/2019   TRIG 87 07/04/2019   CHOLHDL 3.1 03/10/2018   3. At risk for heart disease [name] is at a higher than average risk for cardiovascular disease due to obesity. Reviewed: no chest pain on exertion, no dyspnea on exertion, and no swelling of ankles.  Assessment/Plan:   1. Vitamin D deficiency Low Vitamin D level contributes to fatigue and are associated with obesity, breast, and colon cancer. She agrees to continue to take prescription Vitamin D @50 ,000 IU every week and  will follow-up for routine testing of Vitamin D, at least 2-3 times per year to avoid over-replacement. - Vitamin D, Ergocalciferol, (DRISDOL) 1.25 MG (50000 UNIT) CAPS capsule; Take 1 capsule (50,000 Units total) by mouth every 7 (seven) days.  Dispense: 4 capsule; Refill: 0  2. Other hyperlipidemia Cardiovascular risk and specific lipid/LDL goals reviewed.  We discussed several lifestyle modifications today and [name] will continue to work on diet, exercise and weight loss efforts. Orders and follow up as documented in patient record.   Counseling Intensive lifestyle modifications are the first line treatment for this issue. . Dietary changes: Increase soluble fiber. Decrease simple carbohydrates. . Exercise changes: Moderate to vigorous-intensity aerobic activity 150 minutes per week if tolerated. . Lipid-lowering medications: see documented in medical record.  3. At risk for heart disease [name] was given approximately 15 minutes of coronary artery disease prevention counseling today. She is 49 y.o. female and has risk factors for heart disease including obesity. We discussed intensive lifestyle modifications today with an emphasis on specific weight loss instructions and strategies.   Repetitive spaced learning was employed today to elicit superior memory formation and behavioral change.  4. Class 3 severe obesity with serious comorbidity and body mass index (BMI) of 40.0 to 44.9 in adult, unspecified obesity type (HCC) [name] is currently in the action stage of change. As such, her goal is to continue with weight loss efforts. She has agreed to following a lower carbohydrate, vegetable and lean protein rich diet plan.  Exercise goals: As is.  Behavioral modification strategies: meal planning and cooking strategies and planning for success.  [name] has agreed to follow-up with our clinic in 2 weeks. She was informed of the importance of frequent follow-up visits to maximize her success with  intensive lifestyle modifications for her multiple health conditions.   Objective:   Blood pressure 90/61, pulse 84, temperature 98.1 F (36.7 C), temperature source Oral, height 5\' 3"  (1.6 m), weight 248 lb (112.5 kg), SpO2 100 %. Body mass index is 43.93 kg/m.  General: Cooperative, alert, well developed, in no acute distress. HEENT: Conjunctivae and lids unremarkable. Cardiovascular: Regular rhythm.  Lungs: Normal work of breathing. Neurologic: No focal deficits.   Lab Results  Component Value Date   CREATININE 1.05 (H) 07/08/2019   BUN 20 07/08/2019   NA 135 07/08/2019   K 3.9 07/08/2019   CL 103 07/08/2019   CO2 23 07/08/2019   Lab Results  Component Value Date   ALT 19 06/28/2019   AST 18 06/28/2019   ALKPHOS 63 06/28/2019   BILITOT 0.2 (L) 06/28/2019   Lab Results  Component Value Date   HGBA1C 5.3 07/04/2019   HGBA1C 5.2 01/25/2019   HGBA1C 5.4 08/17/2018   HGBA1C 5.4 03/10/2018   HGBA1C 5.6 10/05/2017   Lab Results  Component Value Date   INSULIN 19.8 07/04/2019   INSULIN 26.5 (H) 01/25/2019   INSULIN 15.6 08/17/2018   INSULIN 7.9 03/10/2018   INSULIN 22.5 10/05/2017   Lab Results  Component Value Date   TSH 5.075 (H) 06/28/2019   Lab Results  Component Value Date   CHOL 262 (H) 07/04/2019   HDL 76 07/04/2019   LDLCALC 171 (H) 07/04/2019   TRIG 87 07/04/2019   CHOLHDL 3.1 03/10/2018   Lab Results  Component Value Date   WBC 12.8 (H) 09/27/2018   HGB 13.4 09/27/2018   HCT 44.5 09/27/2018   MCV 81.8 09/27/2018   PLT 343 09/27/2018   Attestation Statements:   Reviewed by clinician on day of visit: allergies, medications, problem list, medical history, surgical history, family history, social history, and previous encounter notes.  I, Water quality scientist, CMA, am acting as transcriptionist for Abby Potash, PA-C  I have reviewed the above documentation for accuracy and completeness, and I agree with the above. Abby Potash, PA-C

## 2019-08-30 NOTE — Progress Notes (Signed)
No ICM remote transmission received for 08/29/2019 and next ICM transmission scheduled for 09/20/2019.

## 2019-09-06 ENCOUNTER — Other Ambulatory Visit: Payer: Self-pay | Admitting: Internal Medicine

## 2019-09-07 NOTE — Telephone Encounter (Signed)
Pt's pharmacy is requesting a refill on levothyroxine. Would Dr. Klein like to refill this medication? Please address 

## 2019-09-12 ENCOUNTER — Encounter (INDEPENDENT_AMBULATORY_CARE_PROVIDER_SITE_OTHER): Payer: Self-pay | Admitting: Family Medicine

## 2019-09-12 ENCOUNTER — Other Ambulatory Visit: Payer: Self-pay

## 2019-09-12 ENCOUNTER — Ambulatory Visit (INDEPENDENT_AMBULATORY_CARE_PROVIDER_SITE_OTHER): Payer: BC Managed Care – PPO | Admitting: Family Medicine

## 2019-09-12 VITALS — BP 97/62 | HR 87 | Temp 97.8°F | Ht 63.0 in | Wt 245.0 lb

## 2019-09-12 DIAGNOSIS — E038 Other specified hypothyroidism: Secondary | ICD-10-CM | POA: Diagnosis not present

## 2019-09-12 DIAGNOSIS — Z6841 Body Mass Index (BMI) 40.0 and over, adult: Secondary | ICD-10-CM

## 2019-09-12 DIAGNOSIS — E7849 Other hyperlipidemia: Secondary | ICD-10-CM

## 2019-09-12 DIAGNOSIS — Z9189 Other specified personal risk factors, not elsewhere classified: Secondary | ICD-10-CM

## 2019-09-12 NOTE — Progress Notes (Signed)
Chief Complaint:   OBESITY [name] is here to discuss her progress with her obesity treatment plan along with follow-up of her obesity related diagnoses. [name] is on following a lower carbohydrate, vegetable and lean protein rich diet plan and states she is following her eating plan approximately 85% of the time. [name] states she is walking for 20-30 minutes 3 times per week.  Today's visit was #: 30 Starting weight: 277 lbs Starting date: 10/05/2017 Today's weight: 245 lbs Today's date: 09/12/2019 Total lbs lost to date: 32 Total lbs lost since last in-office visit: 3  Interim History: [name] is tired of the low carbohydrate plan but she did like that she could eat as much as she wanted. She denies being hungry on the lower carbohydrate plan. She voices she finds the Category 3 plan to be quite a bit of food. She denies hunger while on Category 3. She was eating [employer] on the low carbohydrate.   Subjective:   1. Other hyperlipidemia Keymora's last LDL was 171 and HDL of 76 in January 2021. She is on Lipitor 40 mg, and she denies myalgias.  2. Other specified hypothyroidism [name] denies cold/heat intolerance or palpitations. She is on levothyroxine 25 mcg daily.  3. At risk for heart disease [name] is at a higher than average risk for cardiovascular disease due to obesity. Reviewed: no chest pain on exertion, no dyspnea on exertion, and no swelling of ankles.  Assessment/Plan:   1. Other hyperlipidemia Cardiovascular risk and specific lipid/LDL goals reviewed. We discussed several lifestyle modifications today and Kolbee will continue to work on diet, exercise and weight loss efforts. We will repeat labs in early May. Orders and follow up as documented in patient record.   Counseling Intensive lifestyle modifications are the first line treatment for this issue. . Dietary changes: Increase soluble fiber. Decrease simple carbohydrates. . Exercise changes: Moderate to  vigorous-intensity aerobic activity 150 minutes per week if tolerated. . Lipid-lowering medications: see documented in medical record.  2. Other specified hypothyroidism Patient with long-standing hypothyroidism, on levothyroxine therapy. She appears euthyroid. Mardi is unable to get her TSH done today but she will get labs prior to her next appointment. Orders and follow up as documented in patient record.  Counseling . Good thyroid control is important for overall health. Supratherapeutic thyroid levels are dangerous and will not improve weight loss results. . The correct way to take levothyroxine is fasting, with water, separated by at least 30 minutes from breakfast, and separated by more than 4 hours from calcium, iron, multivitamins, acid reflux medications (PPIs).   3. At risk for heart disease [name] was given approximately 15 minutes of coronary artery disease prevention counseling today. She is 49 y.o. female and has risk factors for heart disease including obesity. We discussed intensive lifestyle modifications today with an emphasis on specific weight loss instructions and strategies.   Repetitive spaced learning was employed today to elicit superior memory formation and behavioral change.  4. Class 3 severe obesity with serious comorbidity and body mass index (BMI) of 40.0 to 44.9 in adult, unspecified obesity type (HCC) [name] is currently in the action stage of change. As such, her goal is to continue with weight loss efforts. She has agreed to the Category 3 Plan with 6-8 oz of protein at dinner.   Exercise goals: No exercise has been prescribed at this time.  Behavioral modification strategies: increasing lean protein intake, increasing vegetables, meal planning and cooking strategies, keeping healthy foods  in the home and planning for success.  [name] has agreed to follow-up with our clinic in 2 weeks. She was informed of the importance of frequent follow-up visits to maximize  her success with intensive lifestyle modifications for her multiple health conditions.   Objective:   Blood pressure 97/62, pulse 87, temperature 97.8 F (36.6 C), temperature source Oral, height 5\' 3"  (1.6 m), weight 245 lb (111.1 kg), last menstrual period 08/19/2019, SpO2 99 %. Body mass index is 43.4 kg/m.  General: Cooperative, alert, well developed, in no acute distress. HEENT: Conjunctivae and lids unremarkable. Cardiovascular: Regular rhythm.  Lungs: Normal work of breathing. Neurologic: No focal deficits.   Lab Results  Component Value Date   CREATININE 1.05 (H) 07/08/2019   BUN 20 07/08/2019   NA 135 07/08/2019   K 3.9 07/08/2019   CL 103 07/08/2019   CO2 23 07/08/2019   Lab Results  Component Value Date   ALT 19 06/28/2019   AST 18 06/28/2019   ALKPHOS 63 06/28/2019   BILITOT 0.2 (L) 06/28/2019   Lab Results  Component Value Date   HGBA1C 5.3 07/04/2019   HGBA1C 5.2 01/25/2019   HGBA1C 5.4 08/17/2018   HGBA1C 5.4 03/10/2018   HGBA1C 5.6 10/05/2017   Lab Results  Component Value Date   INSULIN 19.8 07/04/2019   INSULIN 26.5 (H) 01/25/2019   INSULIN 15.6 08/17/2018   INSULIN 7.9 03/10/2018   INSULIN 22.5 10/05/2017   Lab Results  Component Value Date   TSH 5.075 (H) 06/28/2019   Lab Results  Component Value Date   CHOL 262 (H) 07/04/2019   HDL 76 07/04/2019   LDLCALC 171 (H) 07/04/2019   TRIG 87 07/04/2019   CHOLHDL 3.1 03/10/2018   Lab Results  Component Value Date   WBC 12.8 (H) 09/27/2018   HGB 13.4 09/27/2018   HCT 44.5 09/27/2018   MCV 81.8 09/27/2018   PLT 343 09/27/2018   No results found for: IRON, TIBC, FERRITIN  Attestation Statements:   Reviewed by clinician on day of visit: allergies, medications, problem list, medical history, surgical history, family history, social history, and previous encounter notes.   I, Trixie Dredge, am acting as transcriptionist for Ilene Qua, MD.  I have reviewed the above  documentation for accuracy and completeness, and I agree with the above. - Ilene Qua, MD

## 2019-09-19 ENCOUNTER — Ambulatory Visit (INDEPENDENT_AMBULATORY_CARE_PROVIDER_SITE_OTHER): Payer: BC Managed Care – PPO | Admitting: *Deleted

## 2019-09-19 DIAGNOSIS — I5022 Chronic systolic (congestive) heart failure: Secondary | ICD-10-CM | POA: Diagnosis not present

## 2019-09-19 LAB — CUP PACEART REMOTE DEVICE CHECK
Battery Remaining Longevity: 67 mo
Battery Voltage: 2.99 V
Brady Statistic RV Percent Paced: 0 %
Date Time Interrogation Session: 20210329022723
HighPow Impedance: 65 Ohm
Implantable Lead Implant Date: 20150706
Implantable Lead Location: 753860
Implantable Pulse Generator Implant Date: 20150706
Lead Channel Impedance Value: 494 Ohm
Lead Channel Impedance Value: 570 Ohm
Lead Channel Pacing Threshold Amplitude: 0.5 V
Lead Channel Pacing Threshold Pulse Width: 0.4 ms
Lead Channel Sensing Intrinsic Amplitude: 9 mV
Lead Channel Sensing Intrinsic Amplitude: 9 mV
Lead Channel Setting Pacing Amplitude: 2 V
Lead Channel Setting Pacing Pulse Width: 0.4 ms
Lead Channel Setting Sensing Sensitivity: 0.3 mV

## 2019-09-19 NOTE — Progress Notes (Signed)
ICD Remote  

## 2019-09-20 ENCOUNTER — Ambulatory Visit (INDEPENDENT_AMBULATORY_CARE_PROVIDER_SITE_OTHER): Payer: BC Managed Care – PPO

## 2019-09-20 DIAGNOSIS — Z95810 Presence of automatic (implantable) cardiac defibrillator: Secondary | ICD-10-CM

## 2019-09-20 DIAGNOSIS — I5022 Chronic systolic (congestive) heart failure: Secondary | ICD-10-CM

## 2019-09-21 NOTE — Progress Notes (Signed)
EPIC Encounter for ICM Monitoring  Patient Name: [name] is a 49 y.o. female Date: 09/21/2019 Primary Care Physican: [name], MD Primary Cardiologist:McLean Electrophysiologist:Klein 09/21/2019 Weight: 245 lbs  Spoke with patientand she noticed she is [name] of breath today when climbing steps today.  OptivolThoracic impedancesuggesting possible fluid accumulation since 09/12/2019.  Prescribed:  Torsemide20 mg Take 4 tablets by mouth every morning and 2 tablets by mouth every evening. Take an extra tab as needed  Potassium 20 mEq Take 4 tablets (80 mEq total) by mouth every morning AND 3 tablets (60 mEq total) every evening.  Spironolactone 25 mg take 1 tablet at bedtime.   Metolazone 2.5 mgTake 2.5 mg by mouth as needed (extra fluid as directed by HF clinic).  Labs: 07/08/2019 Creatinine 1.05, BUN 20, Potassium 3.9, Sodium 135, GFR >60 06/28/2019 Creatinine 0.99, BUN 13, Potassium 3.8, Sodium 141, GFR >60  01/21/2019 Creatinine 1.16, BUN 14, Potassium 3.8, Sodium 138 11/30/2018 Creatinine 1.07, BUN 13, Potassium 4.3, Sodium 139, GFR >60 10/12/2018 Creatinine 1.15, BUN 11, Potassium 3.6, Sodium 136, GFR 57->60 A complete set of results can be found in Results Review.  Recommendations: She will take extra Torsemide tablet and 1 extra Potassium tablet x 2 days.   Fluid levels will be rechecked at office visit on 09/27/2019 with Dr [name].    Follow-up plan: ICM clinic phone appointment on4/11/2019. 91 day device clinic remote transmission3/29/2021. Office appt 4/6/2021with Dr. [name].  3 month ICM trend: 09/19/2019    1 Year ICM trend:       [name], RN 09/21/2019 5:05 PM

## 2019-09-27 ENCOUNTER — Encounter (HOSPITAL_COMMUNITY): Payer: Self-pay | Admitting: Cardiology

## 2019-09-27 ENCOUNTER — Ambulatory Visit (HOSPITAL_COMMUNITY)
Admission: RE | Admit: 2019-09-27 | Discharge: 2019-09-27 | Disposition: A | Discharge status: Home / Self Care | Payer: BC Managed Care – PPO | Source: Ambulatory Visit | Attending: Cardiology | Admitting: Cardiology

## 2019-09-27 ENCOUNTER — Other Ambulatory Visit: Payer: Self-pay

## 2019-09-27 ENCOUNTER — Ambulatory Visit: Payer: BC Managed Care – PPO

## 2019-09-27 ENCOUNTER — Ambulatory Visit (HOSPITAL_BASED_OUTPATIENT_CLINIC_OR_DEPARTMENT_OTHER)
Admission: RE | Admit: 2019-09-27 | Discharge: 2019-09-27 | Disposition: A | Discharge status: Home / Self Care | Payer: BC Managed Care – PPO | Source: Ambulatory Visit | Attending: Cardiology | Admitting: Cardiology

## 2019-09-27 VITALS — BP 80/60 | HR 74 | Wt 254.8 lb

## 2019-09-27 DIAGNOSIS — Z79899 Other long term (current) drug therapy: Secondary | ICD-10-CM | POA: Diagnosis not present

## 2019-09-27 DIAGNOSIS — E876 Hypokalemia: Secondary | ICD-10-CM | POA: Insufficient documentation

## 2019-09-27 DIAGNOSIS — E039 Hypothyroidism, unspecified: Secondary | ICD-10-CM | POA: Insufficient documentation

## 2019-09-27 DIAGNOSIS — Z95810 Presence of automatic (implantable) cardiac defibrillator: Secondary | ICD-10-CM | POA: Insufficient documentation

## 2019-09-27 DIAGNOSIS — I428 Other cardiomyopathies: Secondary | ICD-10-CM | POA: Insufficient documentation

## 2019-09-27 DIAGNOSIS — I5022 Chronic systolic (congestive) heart failure: Secondary | ICD-10-CM

## 2019-09-27 DIAGNOSIS — Z8249 Family history of ischemic heart disease and other diseases of the circulatory system: Secondary | ICD-10-CM | POA: Diagnosis not present

## 2019-09-27 DIAGNOSIS — I493 Ventricular premature depolarization: Secondary | ICD-10-CM | POA: Diagnosis not present

## 2019-09-27 DIAGNOSIS — Z7984 Long term (current) use of oral hypoglycemic drugs: Secondary | ICD-10-CM | POA: Diagnosis not present

## 2019-09-27 DIAGNOSIS — I340 Nonrheumatic mitral (valve) insufficiency: Secondary | ICD-10-CM | POA: Diagnosis not present

## 2019-09-27 DIAGNOSIS — I272 Pulmonary hypertension, unspecified: Secondary | ICD-10-CM | POA: Insufficient documentation

## 2019-09-27 DIAGNOSIS — Z6841 Body Mass Index (BMI) 40.0 and over, adult: Secondary | ICD-10-CM | POA: Insufficient documentation

## 2019-09-27 DIAGNOSIS — R0609 Other forms of dyspnea: Secondary | ICD-10-CM | POA: Insufficient documentation

## 2019-09-27 DIAGNOSIS — I110 Hypertensive heart disease with heart failure: Secondary | ICD-10-CM | POA: Diagnosis not present

## 2019-09-27 DIAGNOSIS — E785 Hyperlipidemia, unspecified: Secondary | ICD-10-CM | POA: Insufficient documentation

## 2019-09-27 DIAGNOSIS — Z7901 Long term (current) use of anticoagulants: Secondary | ICD-10-CM | POA: Diagnosis not present

## 2019-09-27 DIAGNOSIS — R0602 Shortness of breath: Secondary | ICD-10-CM | POA: Insufficient documentation

## 2019-09-27 LAB — COMPREHENSIVE METABOLIC PANEL
ALT: 15 U/L (ref 0–44)
AST: 15 U/L (ref 15–41)
Albumin: 3.5 g/dL (ref 3.5–5.0)
Alkaline Phosphatase: 60 U/L (ref 38–126)
Anion gap: 10 (ref 5–15)
BUN: 15 mg/dL (ref 6–20)
CO2: 25 mmol/L (ref 22–32)
Calcium: 9.2 mg/dL (ref 8.9–10.3)
Chloride: 103 mmol/L (ref 98–111)
Creatinine, Ser: 0.95 mg/dL (ref 0.44–1.00)
GFR calc Af Amer: >60 mL/min (ref >60)
GFR calc non Af Amer: >60 mL/min (ref >60)
Glucose, Bld: 94 mg/dL (ref 70–99)
Potassium: 3.7 mmol/L (ref 3.5–5.1)
Sodium: 138 mmol/L (ref 135–145)
Total Bilirubin: 0.4 mg/dL (ref 0.3–1.2)
Total Protein: 7.6 g/dL (ref 6.5–8.1)

## 2019-09-27 LAB — LIPID PANEL
Cholesterol: 204 mg/dL — ABNORMAL HIGH (ref 0–200)
HDL: 52 mg/dL (ref >40)
LDL Cholesterol: 127 mg/dL — ABNORMAL HIGH (ref 0–99)
Total CHOL/HDL Ratio: 3.9 RATIO
Triglycerides: 123 mg/dL (ref <150)
VLDL: 25 mg/dL (ref 0–40)

## 2019-09-27 LAB — DIGOXIN LEVEL: Digoxin Level: 0.6 ng/mL — ABNORMAL LOW (ref 0.8–2.0)

## 2019-09-27 LAB — TSH: TSH: 3.053 u[IU]/mL (ref 0.350–4.500)

## 2019-09-27 MED ORDER — DAPAGLIFLOZIN PROPANEDIOL 10 MG PO TABS: 10.0000 mg | ORAL_TABLET | Freq: Every day | ORAL | 5 refills | Status: DC

## 2019-09-27 NOTE — Patient Instructions (Addendum)
START Farxiga 10mg  (1 tab) daily. Bring coupon for free 30 day supply to pharmacy  Labs today We will only contact you if something comes back abnormal or we need to make some changes. Otherwise no news is good news!  Repeat labs in 10 days   Your physician recommends that you schedule a follow-up appointment in: 3 months with Dr [name]  Please call office at [phone] option 2 if you have any questions or concerns.   At the [facility], you and your health needs are our priority. As part of our continuing mission to provide you with exceptional heart care, we have created designated Provider Care Teams. These Care Teams include your primary Cardiologist (physician) and Advanced Practice Providers (APPs- Physician Assistants and Nurse Practitioners) who all work together to provide you with the care you need, when you need it.   You may see any of the following providers on your designated Care Team at your next follow up: Marland Kitchen Dr Glori Bickers . Dr [name] . Darrick Grinder, NP . Lyda Jester, PA . Audry Riles, PharmD   Please be sure to bring in all your medications bottles to every appointment.

## 2019-09-27 NOTE — Progress Notes (Signed)
Date:  10/05/2018   ID:  [name], DOB 06-01-1971, MRN [id number]    Provider location: [place] [facility] Type of Visit: Established patient   PCP:  [name], MD    Cardiologist:  [name], MD  Chief Complaint: Exertional dyspnea.   History of Present Illness: [name] is a 49 y.o. 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  She was followed for systolic CHF initially in [place]. Apparently her EF recovered to 35-40%. However, she was admitted to [facility] 09/19/13 for worsening dyspnea and CP. Coronaries were reportedly OK on LHC at that time at [facility]. Echo showed EF of 15-20% with four-chamber enlargement with moderate-severe TR/MR. RHC as below. Rheumatological w/u negative.  CPX in 9/16 showed near-normal functional capacity.  Repeat echo in 6/17 shows that EF remains 20%. Repeat CPX in 1/18 was submaximal but probably only mild circulatory limitation.  Echo 8/18 showed EF 25-30%, severe LV dilation.  CPX in 6/19 showed low normal functional capacity.  Echo in 10/19 showed EF 20-25%, moderate LV dilation.   Zio patch in 2/20 with 11% PVCs and NSVT, amiodarone started. Zio patch in 3/20 with 13.3% PVCs and NSVT.  She saw Dr. [name], he thought that increased PVCs were electromechanical in nature due to worsening heart failure.  Repeat Zio patch on amiodarone in 1/21 showed 6.2% PVCs.   She was admitted in 4/20 with VT in setting of hypokalemia.    CPX in 9/20 showed mild functional limitation, more due to body habitus than heart failure.  Echo was done today and reviewed, EF 20% with severe LV dilation, diffuse hypokinesis, normal RV size and systolic function.   She returns for followup of CHF.  She is doing well symptomatically.  Weight is down 6 lbs. BP is low today but is generally hard to obtain and not sure it is accurate.  She denies  lightheadedness.  She is short of breath after walking up 2-3 flights of stairs.  No problems walking on flat ground.  She walks for exercise.    Medtronic device interrogation: fluid index < threshold, no VT.    ECG (personally reviewed): NSR, PVC, LVH  RHC Procedural Findings: RA mean 5 RV 55/8 PA 56/31, mean 40 PCWP mean 24 Oxygen saturations: PA 61% AO 99% Cardiac Output (Fick) 4.3  Cardiac Index (Fick) 2.0 PVR 3.7 WU  RHC 09/24/13  RA 14  RV 57/22  PA 64/36 (48)  PCWP 18  Fick CO/CI: 3.7/1.6  PVR 8.1 WU  Ao sat 95%  PA sat 47%   CPX 06/27/2016  Peak VO2: 13.5 (56.2% predicted peak VO2) VE/VCO2 slope: 33 OUES: 2.16 Peak RER: 0.98  CPX  1/18 Peak VO2 13.5 VE/VCO2 slope 33 RER 0.98 Submaximal study, mild circulatory limitation, suspect more limited by body habitus.   Echo (8/15) with EF 20%, moderate LV dilation, diffuse hypokinesis, normal RV size with mildly decreased systolic function, mild MR.  Echo (5/16) with EF 20%, spherical LV with diffuse hypokinesis, mild MR.  Echo (6/17) with EF 20%, diffuse hypokinesis, moderate LV dilation, normal RV, mild to moderate MR.  Echo (8/18) with EF 25-30%, severe LV dilation, severe LAE.  Echo (2/20) with EF 20-25%, moderate LV dilation, moderate MR.  Echo (4/21) with EF 20-25%, severe LV dilation, mild-moderate MR, normal RV, PASP 26 mmHg.   Sleep study did not  show significant OSA.   RHC (8/15): Mean RA 3 PA 33/11 mean 19 Mean PCWP 8 CI 2.35  Holter (8/15) with < 1% PVCs Zio patch (1/21) with 6.2% PVCs  CPX 9/16 Peak VO2 16.2 VE/VCO2 slope 23.6 RER 1.25 Normal functional capacity  CPX (6/19): peak VO2 14.3 (83% predicted), VE/VCO2 slope 30 => low normal functional capacity.   CPX (9/20): peak VO2 14.2, VE/VCO2 slope 29, RER 1.13 => mild functional limitation due to body habitus and heart failure.   Labs: (11/09/13): Dig level 0.5, K 3.6, Creatinine 0.83, pro-BNP 622 (12/19/13): K 3.6, creatinine  0.8 (7/15): K 4.1, creatinine 0.91 (8/15): K 3.9, creatinine 0.91 (10/15): K 4, creatinine 0.93, proBNP 129 (12/15): K 4, creatinine 0.9, digoxin 0.4 (5/16): K 4, creatinine 0.86 (9/16): K 3.6, creatinine 0.86, digoxin < 0.2 (5/17): K 4.1, creatinine 0.95, digoxin 0.3 (5/18): K 3.4, creatinine 0.95, TSH elevated but free T3 and free T4 normal.  (8/18): K 3.9, creatinine 0.87, BNP 108  (11/18): K 3.5, creatinine 1.04, digoxin 0.4 (1/19): LDL 91, HDL 47 (3/19): digoxin 0.7, K 4, creatinine 0.99 (4/19): TSH normal, LDL 95 (9/19): LDL 99, HDL 45, K 4, creatinine 0.96 (10/19): K 3.6, creatinine 0.85, digoxin 0.5 (2/20): LDL 103 (3/20): K 3.9, creatinine 1.05 (4/20): K 3.9, creatinine 1.23 => 1.15, TSH 8, digoxin 0.3, LFTs normal 6/20): K 4.3, creatinine 1.07, LFTs normal, digoxin 0.5 (7/20): K 3.8, creatinine 1.16 (8/20): digoxin < 0.2, LDL 135 (1/21): K 3.9, creatinine 1.05, LDL 171  Current Outpatient Medications  Medication Sig Dispense Refill  . amiodarone (PACERONE) 200 MG tablet Take 1 tablet (200 mg total) by mouth daily. 90 tablet 3  . atorvastatin (LIPITOR) 40 MG tablet TAKE 1 TABLET BY MOUTH ONCE A DAY 30 tablet 5  . carvedilol (COREG) 25 MG tablet TAKE 1 TABLET BY MOUTH TWICE A DAY WITH A MEAL 180 tablet 3  . CORLANOR 5 MG TABS tablet TAKE 1 TABLET BY MOUTH TWICE (2) DAILY WITH MEALS 60 tablet 6  . digoxin (LANOXIN) 0.125 MG tablet TAKE 1 TABLET BY MOUTH ONCE A DAY 30 tablet 5  . levothyroxine (SYNTHROID) 25 MCG tablet Take 1 tablet (25 mcg total) by mouth daily before breakfast. 90 tablet 3  . metolazone (ZAROXOLYN) 2.5 MG tablet Take 2.5 mg by mouth as needed (extra fluid as directed by HF clinic).    . potassium chloride SA (KLOR-CON) 20 MEQ tablet TAKE 2 TABLETS BY MOUTH DAILY 60 tablet 5  . sacubitril-valsartan (ENTRESTO) 49-51 MG Take 1 tablet by mouth 2 (two) times daily. 60 tablet 5  . spironolactone (ALDACTONE) 25 MG tablet Take 1 tablet (25 mg total) by mouth at  bedtime. 30 tablet 6  . torsemide (DEMADEX) 20 MG tablet Take 4 tablets (80 mg total) by mouth every morning AND 2 tablets (40 mg total) every evening. 180 tablet 3  . Vitamin D, Ergocalciferol, (DRISDOL) 1.25 MG (50000 UNIT) CAPS capsule Take 1 capsule (50,000 Units total) by mouth every 7 (seven) days. 4 capsule 0  . dapagliflozin propanediol (FARXIGA) 10 MG TABS tablet Take 10 mg by mouth daily before breakfast. 30 tablet 5   No current facility-administered medications for this encounter.    Allergies:   Patient has no known allergies.   Social History:  The patient  reports that she has never smoked. She has never used smokeless tobacco. She reports that she does not drink alcohol or use drugs.   Family History:  The patient's family history  includes Heart disease in her maternal grandmother and mother; High blood pressure in her father; Obesity in her mother; Stroke in her father.   ROS:  Please see the history of present illness.   All other systems are personally reviewed and negative.   Exam:   BP (!) 80/60   Pulse 74   Wt 115.6 kg (254 lb 12.8 oz)   SpO2 100%   BMI 45.14 kg/m  General: NAD, obese Neck: No JVD, no thyromegaly or thyroid nodule.  Lungs: Clear to auscultation bilaterally with normal respiratory effort. CV: Nondisplaced PMI.  Heart regular S1/S2, no S3/S4, no murmur.  No peripheral edema.  No carotid bruit.  Normal pedal pulses.  Abdomen: Soft, nontender, no hepatosplenomegaly, no distention.  Skin: Intact without lesions or rashes.  Neurologic: Alert and oriented x 3.  Psych: Normal affect. Extremities: No clubbing or cyanosis.  HEENT: Normal.   Recent Labs: 10/12/2018: Magnesium 1.9 09/27/2019: ALT 15; BUN 15; Creatinine, Ser 0.95; Potassium 3.7; Sodium 138; TSH 3.053  Personally reviewed   Wt Readings from Last 3 Encounters:  09/27/19 115.6 kg (254 lb 12.8 oz)  09/12/19 111.1 kg (245 lb)  08/29/19 112.5 kg (248 lb)      ASSESSMENT AND PLAN:  1.  Chronic systolic HF: Nonischemic cardiomyopathy.  She has a Medtronic ICD.  Cause for cardiomyopathy is uncertain: initially noted peri-partum (after son's birth), so cannot rule out peri-partum CMP.  On and off, she has had frequent PVCs.   RHC in 8/15 showed normal filling pressures and preserved cardiac output.  She does not qualify for CRT with narrow QRS.  CPX 9/20 with mildly decreased functional capacity due to body habitus and HF.  Echo was done today and reviewed, showing EF 20-25% with severe LV dilation and mild-moderate MR. She does not look volume overloaded on exam and thoracic impedance stable on Medtronic device. NYHA class II. Overall doing well symptomatically.  BP is low but no lightheadedness.   - Continue torsemide 80 qam/40 qpm.  BMET today.    - Continue Entresto 49/51 bid, no BP room to increase.    - Continue Coreg 25 mg bid and ivabradine 5 mg bid.   - Continue spironolactone 25 mg daily and digoxin, check digoxin level today.   - Start dapagliflozin 10 mg daily.  BMET 10 days.  - EF remains low, if she worsens clinically would be candidate for LVAD.  BMI too high currently for transplant.   2. Pulmonary HTN: Mixed picture with PVR 8.1 on RHC at [facility].  However, repeat study in 8/15 here showed no significant pulmonary hypertension.  3. Morbid obesity: Continue to work on weight loss.  [facility]  4. Hyperlipidemia: High LDL in 1/21.   - Recheck lipids today, if LDL remains significantly elevated, will need to increase atorvastatin.  5. PVCs: Worsened with 11% PVCs + NSVT on 2/20 Zio patch.  Amiodarone started, but PVCs up to 13.3% with NSVT on 3/20 Zio patch. She saw Dr. [name] who thought that the ventricular arrhythmias were electromechanical due to worsening HF.  In 4/20, she had VT x 2 with ICD discharges in the setting of hypokalemia. Zio patch in 1/21 with PVCs down to 6.2% of beats.  - Continue amiodarone, check LFTs and TSH  today.  She will need a regular eye exam with amiodarone use.  6. Hypothyroidism: She is on levothyroxine now. TSH today.   Recommended follow-up:  3 months  Signed, [name], MD  09/27/2019  [place] [address] [facility] [place]  [id number] [phone] (office) [phone] (fax)

## 2019-09-28 NOTE — Progress Notes (Signed)
EPIC Encounter for ICM Monitoring  Patient Name: [name] is a 49 y.o. female Date: 09/28/2019 Primary Care Physican: [name], MD Primary Cardiologist:McLean Electrophysiologist:Klein 3/31/2021Weight: 245 lbs  Spoke with patient and she is doing well.  Discussed fluid levels returned to normal after taking extra Torsemide.Marland Kitchen   OptivolThoracic impedancereturned to normal on 09/22/2019.  Prescribed:  Torsemide20 mgTake 4 tablets (80 mg total) by mouth every morning and 2 tablets (40 mg total) by mouth every evening. Take an extra tab as needed  Potassium 20 mEqTake 2 tablets (40 mEq total) by mouth daily  Spironolactone 25 mg take 1 tabletat bedtime.   Metolazone 2.5 mgTake 2.5 mg by mouth as needed (extra fluid as directed by HF clinic).  Labs: 09/27/2019 Creatinine 0.95, BUN 15, Potassium 3.7, Sodium 138, GFR >60 07/08/2019 Creatinine1.05, BUN20, Potassium3.9, Sodium135, GFR>60 06/28/2019 Creatinine0.99, BUN13, Potassium3.8, Sodium141, GFR>60 01/21/2019 Creatinine 1.16, BUN 14, Potassium 3.8, Sodium 138 11/30/2018 Creatinine 1.07, BUN 13, Potassium 4.3, Sodium 139, GFR >60 10/12/2018 Creatinine 1.15, BUN 11, Potassium 3.6, Sodium 136, GFR 57->60 A complete set of results can be found in Results Review.  Recommendations: No changes and encouraged to call if experiencing any fluid symptoms.  Follow-up plan: ICM clinic phone appointment on5/08/2019. 91 day device clinic remote transmission6/28/2021.   3 month ICM trend: 09/27/2019    1 Year ICM trend:       [name], RN 09/28/2019 11:52 AM

## 2019-10-03 ENCOUNTER — Ambulatory Visit (INDEPENDENT_AMBULATORY_CARE_PROVIDER_SITE_OTHER): Payer: BC Managed Care – PPO | Admitting: Physician Assistant

## 2019-10-03 ENCOUNTER — Other Ambulatory Visit: Payer: Self-pay

## 2019-10-03 ENCOUNTER — Encounter (INDEPENDENT_AMBULATORY_CARE_PROVIDER_SITE_OTHER): Payer: Self-pay | Admitting: Physician Assistant

## 2019-10-03 VITALS — BP 103/74 | HR 92 | Temp 98.6°F | Ht 63.0 in | Wt 245.0 lb

## 2019-10-03 DIAGNOSIS — E7849 Other hyperlipidemia: Secondary | ICD-10-CM

## 2019-10-03 DIAGNOSIS — Z9189 Other specified personal risk factors, not elsewhere classified: Secondary | ICD-10-CM

## 2019-10-03 DIAGNOSIS — Z6841 Body Mass Index (BMI) 40.0 and over, adult: Secondary | ICD-10-CM

## 2019-10-03 DIAGNOSIS — E559 Vitamin D deficiency, unspecified: Secondary | ICD-10-CM

## 2019-10-03 MED ORDER — VITAMIN D (ERGOCALCIFEROL) 1.25 MG (50000 UNIT) PO CAPS: 50000.0000 [IU] | ORAL_CAPSULE | ORAL | 0 refills | Status: DC

## 2019-10-03 NOTE — Progress Notes (Signed)
Chief Complaint:   OBESITY [name] is here to discuss her progress with her obesity treatment plan along with follow-up of her obesity related diagnoses. [name] is on the Category 3 Plan and states she is following her eating plan approximately 0% of the time. [name] states she is exercising 0 minutes 0 times per week.  Today's visit was #: 24 Starting weight: 277 lbs Starting date: 10/05/2017 Today's weight: 245 lbs Today's date: 10/03/2019 Total lbs lost to date: 32 Total lbs lost since last in-office visit: 0  Interim History: [name] states that she has been carrying extra fluid on her abdomen due to her CHF. She states her EF is at 20%. She is following up with cardiology regularly. Her appetite has been  decreased over the last 2 weeks. She states that she has previously enjoyed the low carb plan and wants to get back on it as long as she can add a fruit once daily.   Subjective:   Vitamin D deficiency. No nausea, vomiting, or muscle weakness of prescription Vitamin D. Last Vitamin D 14.4 on 07/04/2019.  Other hyperlipidemia. Page is on Crestor. No chest pain. No myalgias.  Lab Results  Component Value Date   CHOL 204 (H) 09/27/2019   HDL 52 09/27/2019   LDLCALC 127 (H) 09/27/2019   TRIG 123 09/27/2019   CHOLHDL 3.9 09/27/2019   Lab Results  Component Value Date   ALT 15 09/27/2019   AST 15 09/27/2019   ALKPHOS 60 09/27/2019   BILITOT 0.4 09/27/2019   The 10-year ASCVD risk score [name], et al., 2013) is: 1.3%   Values used to calculate the score:     Age: 49 years     Sex: Female     Is Non-Hispanic African American: Yes     Diabetic: No     Tobacco smoker: No     Systolic Blood Pressure: [id number] mmHg     Is BP treated: Yes     HDL Cholesterol: 52 mg/dL     Total Cholesterol: 204 mg/dL  At risk for activity intolerance. [name] is at risk of exercise intolerance due to obesity.  Assessment/Plan:   Vitamin D deficiency. Low Vitamin D level  contributes to fatigue and are associated with obesity, breast, and colon cancer. She was given a refill on her Vitamin D, Ergocalciferol, (DRISDOL) 1.25 MG (50000 UNIT) CAPS capsule every week #4 with 0 refills and will follow-up for routine testing of Vitamin D, at least 2-3 times per year to avoid over-replacement.     Other hyperlipidemia. Cardiovascular risk and specific lipid/LDL goals reviewed.  We discussed several lifestyle modifications today and [name] will continue to work on diet, exercise and weight loss efforts. Orders and follow up as documented in patient record. She will continue her medication as directed.  Counseling Intensive lifestyle modifications are the first line treatment for this issue. . Dietary changes: Increase soluble fiber. Decrease simple carbohydrates. . Exercise changes: Moderate to vigorous-intensity aerobic activity 150 minutes per week if tolerated. . Lipid-lowering medications: see documented in medical record.  At risk for activity intolerance. [name] was given approximately 15 minutes of exercise intolerance counseling today. She is 49 y.o. female and has risk factors exercise intolerance including obesity. We discussed intensive lifestyle modifications today with an emphasis on specific weight loss instructions and strategies. [name] will slowly increase activity as tolerated.  Repetitive spaced learning was employed today to elicit superior memory formation and behavioral change.  Class  3 severe obesity with serious comorbidity and body mass index (BMI) of 40.0 to 44.9 in adult, unspecified obesity type ([place]).  [name] is currently in the action stage of change. As such, her goal is to continue with weight loss efforts. She has agreed to following a lower carbohydrate, vegetable and lean protein rich diet plan and one fruit.   Exercise goals: For substantial health benefits, adults should do at least 150 minutes (2 hours and 30 minutes) a week of  moderate-intensity, or 75 minutes (1 hour and 15 minutes) a week of vigorous-intensity aerobic physical activity, or an equivalent combination of moderate- and vigorous-intensity aerobic activity. Aerobic activity should be performed in episodes of at least 10 minutes, and preferably, it should be spread throughout the week.  Behavioral modification strategies: meal planning and cooking strategies and keeping healthy foods in the home.  [name] has agreed to follow-up with our clinic in 3 weeks. She was informed of the importance of frequent follow-up visits to maximize her success with intensive lifestyle modifications for her multiple health conditions.   Objective:   Blood pressure 103/74, pulse 92, temperature 98.6 F (37 C), temperature source Oral, height 5\' 3"  (1.6 m), weight 245 lb (111.1 kg), last menstrual period 09/22/2019, SpO2 100 %. Body mass index is 43.4 kg/m.  General: Cooperative, alert, well developed, in no acute distress. HEENT: Conjunctivae and lids unremarkable. Cardiovascular: Regular rhythm.  Lungs: Normal work of breathing. Neurologic: No focal deficits.   Lab Results  Component Value Date   CREATININE 0.95 09/27/2019   BUN 15 09/27/2019   NA 138 09/27/2019   K 3.7 09/27/2019   CL 103 09/27/2019   CO2 25 09/27/2019   Lab Results  Component Value Date   ALT 15 09/27/2019   AST 15 09/27/2019   ALKPHOS 60 09/27/2019   BILITOT 0.4 09/27/2019   Lab Results  Component Value Date   HGBA1C 5.3 07/04/2019   HGBA1C 5.2 01/25/2019   HGBA1C 5.4 08/17/2018   HGBA1C 5.4 03/10/2018   HGBA1C 5.6 10/05/2017   Lab Results  Component Value Date   INSULIN 19.8 07/04/2019   INSULIN 26.5 (H) 01/25/2019   INSULIN 15.6 08/17/2018   INSULIN 7.9 03/10/2018   INSULIN 22.5 10/05/2017   Lab Results  Component Value Date   TSH 3.053 09/27/2019   Lab Results  Component Value Date   CHOL 204 (H) 09/27/2019   HDL 52 09/27/2019   LDLCALC 127 (H) 09/27/2019   TRIG 123  09/27/2019   CHOLHDL 3.9 09/27/2019   Lab Results  Component Value Date   WBC 12.8 (H) 09/27/2018   HGB 13.4 09/27/2018   HCT 44.5 09/27/2018   MCV 81.8 09/27/2018   PLT 343 09/27/2018   No results found for: IRON, TIBC, FERRITIN  Attestation Statements:   Reviewed by clinician on day of visit: allergies, medications, problem list, medical history, surgical history, family history, social history, and previous encounter notes.  IMichaelene Song, am acting as transcriptionist for Abby Potash, PA-C   I have reviewed the above documentation for accuracy and completeness, and I agree with the above. -  *Abby Potash, PA-C

## 2019-10-06 ENCOUNTER — Other Ambulatory Visit (HOSPITAL_COMMUNITY): Payer: Self-pay | Admitting: Cardiology

## 2019-10-07 ENCOUNTER — Other Ambulatory Visit: Payer: Self-pay

## 2019-10-07 ENCOUNTER — Ambulatory Visit (HOSPITAL_COMMUNITY)
Admission: RE | Admit: 2019-10-07 | Discharge: 2019-10-07 | Disposition: A | Discharge status: Home / Self Care | Payer: BC Managed Care – PPO | Source: Ambulatory Visit | Attending: Cardiology | Admitting: Cardiology

## 2019-10-07 DIAGNOSIS — I272 Pulmonary hypertension, unspecified: Secondary | ICD-10-CM | POA: Insufficient documentation

## 2019-10-07 LAB — BASIC METABOLIC PANEL
Anion gap: 6 (ref 5–15)
BUN: 14 mg/dL (ref 6–20)
CO2: 24 mmol/L (ref 22–32)
Calcium: 9.1 mg/dL (ref 8.9–10.3)
Chloride: 109 mmol/L (ref 98–111)
Creatinine, Ser: 0.95 mg/dL (ref 0.44–1.00)
GFR calc Af Amer: >60 mL/min (ref >60)
GFR calc non Af Amer: >60 mL/min (ref >60)
Glucose, Bld: 95 mg/dL (ref 70–99)
Potassium: 4.2 mmol/L (ref 3.5–5.1)
Sodium: 139 mmol/L (ref 135–145)

## 2019-10-24 ENCOUNTER — Encounter (INDEPENDENT_AMBULATORY_CARE_PROVIDER_SITE_OTHER): Payer: Self-pay | Admitting: Physician Assistant

## 2019-10-24 ENCOUNTER — Ambulatory Visit (INDEPENDENT_AMBULATORY_CARE_PROVIDER_SITE_OTHER): Payer: BC Managed Care – PPO

## 2019-10-24 ENCOUNTER — Other Ambulatory Visit: Payer: Self-pay

## 2019-10-24 ENCOUNTER — Ambulatory Visit (INDEPENDENT_AMBULATORY_CARE_PROVIDER_SITE_OTHER): Payer: BC Managed Care – PPO | Admitting: Physician Assistant

## 2019-10-24 VITALS — BP 97/66 | HR 73 | Temp 98.5°F | Ht 63.0 in | Wt 244.0 lb

## 2019-10-24 DIAGNOSIS — Z95810 Presence of automatic (implantable) cardiac defibrillator: Secondary | ICD-10-CM

## 2019-10-24 DIAGNOSIS — I5022 Chronic systolic (congestive) heart failure: Secondary | ICD-10-CM

## 2019-10-24 DIAGNOSIS — Z9189 Other specified personal risk factors, not elsewhere classified: Secondary | ICD-10-CM

## 2019-10-24 DIAGNOSIS — Z6841 Body Mass Index (BMI) 40.0 and over, adult: Secondary | ICD-10-CM

## 2019-10-24 DIAGNOSIS — E7849 Other hyperlipidemia: Secondary | ICD-10-CM | POA: Diagnosis not present

## 2019-10-24 DIAGNOSIS — E559 Vitamin D deficiency, unspecified: Secondary | ICD-10-CM

## 2019-10-24 MED ORDER — VITAMIN D (ERGOCALCIFEROL) 1.25 MG (50000 UNIT) PO CAPS: 50000.0000 [IU] | ORAL_CAPSULE | ORAL | 0 refills | Status: DC

## 2019-10-24 NOTE — Progress Notes (Signed)
Chief Complaint:   OBESITY [name] is here to discuss her progress with her obesity treatment plan along with follow-up of her obesity related diagnoses. [name] is following a lower carbohydrate, vegetable and lean protein rich diet plan and states she is following her eating plan approximately 90% of the time. [name] states she is walking 30-40 minutes 7 times per week.  Today's visit was #: 43 Starting weight: 277 lbs Starting date: 10/05/2017 Today's weight: 244 lbs Today's date: 10/24/2019 Total lbs lost to date: 33 Total lbs lost since last in-office visit: 1  Interim History: [name] reports that her appetite has been decreased due to her congestive heart failure and has not been getting a lot of food in. She is having her fluid weight measured with cardiology today.   Subjective:   Vitamin D deficiency. [name] is on prescription Vitamin D. No nausea, vomiting, or muscle weakness. Last Vitamin D 14.4 on 07/04/2019.  Other hyperlipidemia. [name] is on atorvastatin. No chest pain or myalgias.   Lab Results  Component Value Date   CHOL 204 (H) 09/27/2019   HDL 52 09/27/2019   LDLCALC 127 (H) 09/27/2019   TRIG 123 09/27/2019   CHOLHDL 3.9 09/27/2019   Lab Results  Component Value Date   ALT 15 09/27/2019   AST 15 09/27/2019   ALKPHOS 60 09/27/2019   BILITOT 0.4 09/27/2019   The 10-year ASCVD risk score Miranda Bussing DC Jr., et al., 2013) is: 1%   Values used to calculate the score:     Age: 49 years     Sex: Female     Is Non-Hispanic African American: Yes     Diabetic: No     Tobacco smoker: No     Systolic Blood Pressure: 97 mmHg     Is BP treated: Yes     HDL Cholesterol: 52 mg/dL     Total Cholesterol: 204 mg/dL  At risk for activity intolerance. [name] is at risk of exercise intolerance due to obesity.  Assessment/Plan:   Vitamin D deficiency. Low Vitamin D level contributes to fatigue and are associated with obesity, breast, and colon cancer. She was given a  refill on her Vitamin D, Ergocalciferol, (DRISDOL) 1.25 MG (50000 UNIT) CAPS capsule every week #4 with 0 refills and will follow-up for routine testing of Vitamin D, at least 2-3 times per year to avoid over-replacement.     Other hyperlipidemia. Cardiovascular risk and specific lipid/LDL goals reviewed.  We discussed several lifestyle modifications today and [name] will continue to work on diet, exercise and weight loss efforts. Orders and follow up as documented in patient record. [name] will continue her medication as directed.  Counseling Intensive lifestyle modifications are the first line treatment for this issue. . Dietary changes: Increase soluble fiber. Decrease simple carbohydrates. . Exercise changes: Moderate to vigorous-intensity aerobic activity 150 minutes per week if tolerated. . Lipid-lowering medications: see documented in medical record.  At risk for activity intolerance. [name] was given approximately 15 minutes of exercise intolerance counseling today. She is 50 y.o. female and has risk factors exercise intolerance including obesity. We discussed intensive lifestyle modifications today with an emphasis on specific weight loss instructions and strategies. [name] will slowly increase activity as tolerated.  Repetitive spaced learning was employed today to elicit superior memory formation and behavioral change.  Class 3 severe obesity with serious comorbidity and body mass index (BMI) of 40.0 to 44.9 in adult, unspecified obesity type ([name]).  [name] is currently in  the action stage of change. As such, her goal is to continue with weight loss efforts. She has agreed to following a lower carbohydrate, vegetable and lean protein rich diet plan.   Exercise goals: For substantial health benefits, adults should do at least 150 minutes (2 hours and 30 minutes) a week of moderate-intensity, or 75 minutes (1 hour and 15 minutes) a week of vigorous-intensity aerobic physical activity, or an  equivalent combination of moderate- and vigorous-intensity aerobic activity. Aerobic activity should be performed in episodes of at least 10 minutes, and preferably, it should be spread throughout the week.  Behavioral modification strategies: meal planning and cooking strategies and keeping healthy foods in the home.  [name] has agreed to follow-up with our clinic in 3 weeks. She was informed of the importance of frequent follow-up visits to maximize her success with intensive lifestyle modifications for her multiple health conditions.   Objective:   Blood pressure 97/66, pulse 73, temperature 98.5 F (36.9 C), temperature source Oral, height 5\' 3"  (1.6 m), weight 244 lb (110.7 kg), last menstrual period 09/24/2019, SpO2 100 %. Body mass index is 43.22 kg/m.  General: Cooperative, alert, well developed, in no acute distress. HEENT: Conjunctivae and lids unremarkable. Cardiovascular: Regular rhythm.  Lungs: Normal work of breathing. Neurologic: No focal deficits.   Lab Results  Component Value Date   CREATININE 0.95 10/07/2019   BUN 14 10/07/2019   NA 139 10/07/2019   K 4.2 10/07/2019   CL 109 10/07/2019   CO2 24 10/07/2019   Lab Results  Component Value Date   ALT 15 09/27/2019   AST 15 09/27/2019   ALKPHOS 60 09/27/2019   BILITOT 0.4 09/27/2019   Lab Results  Component Value Date   HGBA1C 5.3 07/04/2019   HGBA1C 5.2 01/25/2019   HGBA1C 5.4 08/17/2018   HGBA1C 5.4 03/10/2018   HGBA1C 5.6 10/05/2017   Lab Results  Component Value Date   INSULIN 19.8 07/04/2019   INSULIN 26.5 (H) 01/25/2019   INSULIN 15.6 08/17/2018   INSULIN 7.9 03/10/2018   INSULIN 22.5 10/05/2017   Lab Results  Component Value Date   TSH 3.053 09/27/2019   Lab Results  Component Value Date   CHOL 204 (H) 09/27/2019   HDL 52 09/27/2019   LDLCALC 127 (H) 09/27/2019   TRIG 123 09/27/2019   CHOLHDL 3.9 09/27/2019   Lab Results  Component Value Date   WBC 12.8 (H) 09/27/2018   HGB 13.4  09/27/2018   HCT 44.5 09/27/2018   MCV 81.8 09/27/2018   PLT 343 09/27/2018   No results found for: IRON, TIBC, FERRITIN  Attestation Statements:   Reviewed by clinician on day of visit: allergies, medications, problem list, medical history, surgical history, family history, social history, and previous encounter notes.  IMichaelene Clark, am acting as transcriptionist for Abby Potash, PA-C   Miranda have reviewed the above documentation for accuracy and completeness, and Miranda agree with the above. Abby Potash, PA-C

## 2019-10-25 NOTE — Progress Notes (Signed)
EPIC Encounter for ICM Monitoring  Patient Name: [name] is a 49 y.o. female Date: 10/25/2019 Primary Care Physican: [name], MD Primary Cardiologist:McLean Electrophysiologist:Klein 5/4/2021Weight: 245 lbs  Spoke with patient and she is feeling well.   OptivolThoracic impedance normal.  Prescribed:  Torsemide20 mgTake 4 tablets (80 mg total) by mouth every morning and 2 tablets (40 mg total) by mouth every evening. Take an extra tab as needed  Potassium 20 mEqTake 2 tablets (40 mEq total) by mouth daily  Spironolactone 25 mg take 1 tabletat bedtime.   Metolazone 2.5 mgTake 2.5 mg by mouth as needed (extra fluid as directed by HF clinic).  Labs: 09/27/2019 Creatinine 0.95, BUN 15, Potassium 3.7, Sodium 138, GFR >60 07/08/2019 Creatinine1.05, BUN20, Potassium3.9, Sodium135, GFR>60 06/28/2019 Creatinine0.99, BUN13, Potassium3.8, Sodium141, GFR>60 01/21/2019 Creatinine 1.16, BUN 14, Potassium 3.8, Sodium 138 11/30/2018 Creatinine 1.07, BUN 13, Potassium 4.3, Sodium 139, GFR >60 10/12/2018 Creatinine 1.15, BUN 11, Potassium 3.6, Sodium 136, GFR 57->60 A complete set of results can be found in Results Review.  Recommendations: No changes and encouraged to call if experiencing any fluid symptoms.  Follow-up plan: ICM clinic phone appointment on6/12/2019. 91 day device clinic remote transmission6/28/2021.   3 month ICM trend: 10/25/2019    1 Year ICM trend:       [name], RN 10/25/2019 3:10 PM

## 2019-11-15 ENCOUNTER — Ambulatory Visit (INDEPENDENT_AMBULATORY_CARE_PROVIDER_SITE_OTHER): Payer: BC Managed Care – PPO | Admitting: Physician Assistant

## 2019-11-22 ENCOUNTER — Ambulatory Visit (INDEPENDENT_AMBULATORY_CARE_PROVIDER_SITE_OTHER): Payer: BC Managed Care – PPO | Admitting: Family Medicine

## 2019-11-29 ENCOUNTER — Telehealth: Payer: Self-pay

## 2019-11-29 NOTE — Telephone Encounter (Signed)
Received voice mail message from patient stating there is an error message on the carelink home monitor and requesting a return call.

## 2019-11-29 NOTE — Telephone Encounter (Signed)
Call back to patient.  Provided carelink tech support number and advised her to call for assistance.  Advised the monitor has been disconnected x 33 days.  The date of the monitor is 2015 and advised patient [employer] will probably send her a new monitor.  She is feeling fine and denies fluid symptoms at this time.

## 2019-11-29 NOTE — Telephone Encounter (Signed)
Spoke with patient to remind of missed remote transmission 

## 2019-11-30 ENCOUNTER — Other Ambulatory Visit (HOSPITAL_COMMUNITY): Payer: Self-pay | Admitting: Cardiology

## 2019-12-01 ENCOUNTER — Encounter (INDEPENDENT_AMBULATORY_CARE_PROVIDER_SITE_OTHER): Payer: Self-pay | Admitting: Family Medicine

## 2019-12-01 ENCOUNTER — Other Ambulatory Visit: Payer: Self-pay

## 2019-12-01 ENCOUNTER — Other Ambulatory Visit (INDEPENDENT_AMBULATORY_CARE_PROVIDER_SITE_OTHER): Payer: Self-pay | Admitting: Family Medicine

## 2019-12-01 ENCOUNTER — Ambulatory Visit (INDEPENDENT_AMBULATORY_CARE_PROVIDER_SITE_OTHER): Payer: BC Managed Care – PPO | Admitting: Family Medicine

## 2019-12-01 VITALS — BP 96/62 | HR 75 | Temp 98.3°F | Ht 63.0 in | Wt 242.0 lb

## 2019-12-01 DIAGNOSIS — E66813 Obesity, class 3: Secondary | ICD-10-CM

## 2019-12-01 DIAGNOSIS — E559 Vitamin D deficiency, unspecified: Secondary | ICD-10-CM | POA: Diagnosis not present

## 2019-12-01 DIAGNOSIS — E8881 Metabolic syndrome: Secondary | ICD-10-CM | POA: Diagnosis not present

## 2019-12-01 DIAGNOSIS — Z6841 Body Mass Index (BMI) 40.0 and over, adult: Secondary | ICD-10-CM | POA: Diagnosis not present

## 2019-12-01 DIAGNOSIS — E88819 Insulin resistance, unspecified: Secondary | ICD-10-CM

## 2019-12-01 DIAGNOSIS — I5022 Chronic systolic (congestive) heart failure: Secondary | ICD-10-CM

## 2019-12-01 NOTE — Progress Notes (Signed)
Chief Complaint:   OBESITY [name] is here to discuss her progress with her obesity treatment plan along with follow-up of her obesity related diagnoses. [name] is on following a lower carbohydrate, vegetable and lean protein rich diet plan and states she is following her eating plan approximately 50% of the time. [name] states she is walking for 30 minutes 2-3 times per week.  Today's visit was #: 71 Starting weight: 277 lbs Starting date: 10/05/2017 Today's weight: 242 lbs Today's date: 12/01/2019 Total lbs lost to date: 35 Total lbs lost since last in-office visit: 2  Interim History: [name] is bored with the Low carbohydrate plan, and she feels she need to change things up. She notes lack of motivation. She reports being off the plan over the past few weeks. Overall, her appetite is poor at times due to heart failure.  Subjective:   1. Systolic heart failure, unspecified HF chronicity ([place]) [name] notes shortness of breath has not been bad recently. She has an ICM. She does daily weights at home. She can drink 2 liters of fluid per day. Last EF was 20% in April 2021.  2. Vitamin D deficiency [name]'s last Vit D level was low at 14.4 on 07/04/2019. She is on prescription Vit D once every 7 days.  3. Insulin resistance [name] has a diagnosis of insulin resistance based on her elevated fasting insulin level >5. She is not on metformin, and she denies polyphagia. She struggles with poor appetite. She continues to work on diet and exercise to decrease her risk of diabetes.  Lab Results  Component Value Date   INSULIN 19.8 07/04/2019   INSULIN 26.5 (H) 01/25/2019   INSULIN 15.6 08/17/2018   INSULIN 7.9 03/10/2018   INSULIN 22.5 10/05/2017   Lab Results  Component Value Date   HGBA1C 5.3 07/04/2019   Assessment/Plan:   1. Systolic heart failure, unspecified HF chronicity ([facility]) Abrey will continue to follow up with Cardiology as directed.  2. Vitamin D deficiency Low Vitamin D  level contributes to fatigue and are associated with obesity, breast, and colon cancer. [name] will continue taking prescription Vitamin D 50,000 IU every week and will follow-up for routine testing of Vitamin D, at least 2-3 times per year to avoid over-replacement. We will check labs today.  - VITAMIN D 25 Hydroxy (Vit-D Deficiency, Fractures)  3. Insulin resistance [name] will continue to work on weight loss, exercise, and decreasing simple carbohydrates to help decrease the risk of diabetes. We will check labs today. [name] agreed to follow-up with [name] as directed to closely monitor her progress.  - Hemoglobin A1c - Insulin, random  4. Class 3 severe obesity with serious comorbidity and body mass index (BMI) of 40.0 to 44.9 in adult, unspecified obesity type ([facility]) [name] is currently in the action stage of change. As such, her goal is to continue with weight loss efforts. She has agreed to keeping a food journal and adhering to recommended goals of 1000-1100 calories and 70-75 grams of protein daily.   [name] was switched to journaling. She is to eat several small meals. Handouts given today: Journaling, and Protein Content of Food.  Exercise goals: As is.  Behavioral modification strategies: increasing lean protein intake and keeping a strict food journal.  [name] has agreed to follow-up with our clinic in 2 to 3 weeks. She was informed of the importance of frequent follow-up visits to maximize her success with intensive lifestyle modifications for her multiple health conditions.   [name]  was informed we would discuss her lab results at her next visit unless there is a critical issue that needs to be addressed sooner. [name] agreed to keep her next visit at the agreed upon time to discuss these results.  Objective:   Blood pressure 96/62, pulse 75, temperature 98.3 F (36.8 C), temperature source Oral, height 5\' 3"  (1.6 m), weight 242 lb (109.8 kg), SpO2 99 %. Body mass index is 42.87  kg/m.  General: Cooperative, alert, well developed, in no acute distress. HEENT: Conjunctivae and lids unremarkable. Cardiovascular: Regular rhythm.  Lungs: Normal work of breathing. Neurologic: No focal deficits.   Lab Results  Component Value Date   CREATININE 0.95 10/07/2019   BUN 14 10/07/2019   NA 139 10/07/2019   K 4.2 10/07/2019   CL 109 10/07/2019   CO2 24 10/07/2019   Lab Results  Component Value Date   ALT 15 09/27/2019   AST 15 09/27/2019   ALKPHOS 60 09/27/2019   BILITOT 0.4 09/27/2019   Lab Results  Component Value Date   HGBA1C 5.3 07/04/2019   HGBA1C 5.2 01/25/2019   HGBA1C 5.4 08/17/2018   HGBA1C 5.4 03/10/2018   HGBA1C 5.6 10/05/2017   Lab Results  Component Value Date   INSULIN 19.8 07/04/2019   INSULIN 26.5 (H) 01/25/2019   INSULIN 15.6 08/17/2018   INSULIN 7.9 03/10/2018   INSULIN 22.5 10/05/2017   Lab Results  Component Value Date   TSH 3.053 09/27/2019   Lab Results  Component Value Date   CHOL 204 (H) 09/27/2019   HDL 52 09/27/2019   LDLCALC 127 (H) 09/27/2019   TRIG 123 09/27/2019   CHOLHDL 3.9 09/27/2019   Lab Results  Component Value Date   WBC 12.8 (H) 09/27/2018   HGB 13.4 09/27/2018   HCT 44.5 09/27/2018   MCV 81.8 09/27/2018   PLT 343 09/27/2018   No results found for: IRON, TIBC, FERRITIN  Obesity Behavioral Intervention Documentation for Insurance:   Approximately 15 minutes were spent on the discussion below.  ASK: We discussed the diagnosis of obesity with [name] today and [name] agreed to give [name] permission to discuss obesity behavioral modification therapy today.  ASSESS: [name] has the diagnosis of obesity and her BMI today is 42.88. [name] is in the action stage of change.   ADVISE: [name] was educated on the multiple health risks of obesity as well as the benefit of weight loss to improve her health. She was advised of the need for long term treatment and the importance of lifestyle modifications to improve  her current health and to decrease her risk of future health problems.  AGREE: Multiple dietary modification options and treatment options were discussed and Raeanne agreed to follow the recommendations documented in the above note.  ARRANGE: [name] was educated on the importance of frequent visits to treat obesity as outlined per CMS and USPSTF guidelines and agreed to schedule her next follow up appointment today.  Attestation Statements:   Reviewed by clinician on day of visit: allergies, medications, problem list, medical history, surgical history, family history, social history, and previous encounter notes.   Wilhemena Durie, am acting as Location manager for [employer], FNP-C.  I have reviewed the above documentation for accuracy and completeness, and I agree with the above. -  Georgianne Fick, FNP

## 2019-12-02 LAB — HEMOGLOBIN A1C
Est. average glucose Bld gHb Est-mCnc: 105 mg/dL
Hgb A1c MFr Bld: 5.3 % (ref 4.8–5.6)

## 2019-12-02 LAB — INSULIN, RANDOM: INSULIN: 16.6 u[IU]/mL (ref 2.6–24.9)

## 2019-12-02 LAB — VITAMIN D 25 HYDROXY (VIT D DEFICIENCY, FRACTURES): Vit D, 25-Hydroxy: 30.5 ng/mL (ref 30.0–100.0)

## 2019-12-02 NOTE — Progress Notes (Signed)
No ICM remote transmission received for 11/28/2019 and next ICM transmission scheduled for 12/20/2019.

## 2019-12-19 ENCOUNTER — Ambulatory Visit (INDEPENDENT_AMBULATORY_CARE_PROVIDER_SITE_OTHER): Payer: BC Managed Care – PPO | Admitting: *Deleted

## 2019-12-19 DIAGNOSIS — I428 Other cardiomyopathies: Secondary | ICD-10-CM | POA: Diagnosis not present

## 2019-12-20 ENCOUNTER — Ambulatory Visit (INDEPENDENT_AMBULATORY_CARE_PROVIDER_SITE_OTHER): Payer: BC Managed Care – PPO

## 2019-12-20 ENCOUNTER — Telehealth: Payer: Self-pay

## 2019-12-20 DIAGNOSIS — I5022 Chronic systolic (congestive) heart failure: Secondary | ICD-10-CM

## 2019-12-20 DIAGNOSIS — Z95810 Presence of automatic (implantable) cardiac defibrillator: Secondary | ICD-10-CM

## 2019-12-20 NOTE — Telephone Encounter (Signed)
Returned patient call and she reported her remote monitor is not working.  She tried to send a report but is not going through.  This is a new monitor that she received 11/30/2019.  She is unable to send the report and will call [employer] tech support number for assistance.

## 2019-12-20 NOTE — Progress Notes (Signed)
EPIC Encounter for ICM Monitoring  Patient Name: [name] is a 49 y.o. female Date: 12/20/2019 Primary Care Physican: [name], MD Primary Cardiologist:McLean Electrophysiologist:Klein 6/29/2021Weight: 243 lbs  Spoke with patient and she is feeling [name] of breath for the last few days.  She said she will take extra Torsemide for a couple of days   OptivolThoracic impedance suggesting possible fluid accumulation starting 12/13/2019 and showing improvement.  Prescribed:  Torsemide20 mgTake 4 tablets(80 mg total)by mouth every morning and 2 tablets(40 mg total)by mouth every evening. Take an extra tab as needed  Potassium 20 mEqTake2tablets (40 mEq total) by mouthdaily  Spironolactone 25 mg take 1 tabletat bedtime.   Metolazone 2.5 mgTake 2.5 mg by mouth as needed (extra fluid as directed by HF clinic).  Labs: 10/07/2019 Creatinine 0.95, BUN 14, Potassium 4.2, Sodium 139, GFR >60 09/27/2019 Creatinine 0.95, BUN 15, Potassium 3.7, Sodium 138, GFR >60 07/08/2019 Creatinine1.05, BUN20, Potassium3.9, Sodium135, GFR>60 06/28/2019 Creatinine0.99, BUN13, Potassium3.8, Sodium141, GFR>60 A complete set of results can be found in Results Review.  Recommendations: Patient taking extra Torsemide as prescribed x 2 days.  Follow-up plan: ICM clinic phone appointment on 12/28/2019 to recheck fluid levels. 91 day device clinic remote transmission9/27/2021.     Copy of ICM check sent to Dr. [name] and Dr [name].   3 month ICM trend: 12/20/2019    1 Year ICM trend:       Miranda Billings, RN 12/20/2019 4:43 PM

## 2019-12-22 ENCOUNTER — Ambulatory Visit (INDEPENDENT_AMBULATORY_CARE_PROVIDER_SITE_OTHER): Payer: BC Managed Care – PPO | Admitting: Physician Assistant

## 2019-12-22 ENCOUNTER — Encounter (INDEPENDENT_AMBULATORY_CARE_PROVIDER_SITE_OTHER): Payer: Self-pay | Admitting: Physician Assistant

## 2019-12-22 ENCOUNTER — Other Ambulatory Visit: Payer: Self-pay

## 2019-12-22 VITALS — BP 107/76 | HR 78 | Temp 98.1°F | Ht 63.0 in | Wt 238.0 lb

## 2019-12-22 DIAGNOSIS — E7849 Other hyperlipidemia: Secondary | ICD-10-CM | POA: Diagnosis not present

## 2019-12-22 DIAGNOSIS — Z6841 Body Mass Index (BMI) 40.0 and over, adult: Secondary | ICD-10-CM

## 2019-12-22 DIAGNOSIS — E8881 Metabolic syndrome: Secondary | ICD-10-CM

## 2019-12-22 NOTE — Progress Notes (Signed)
Chief Complaint:   OBESITY [name] is here to discuss her progress with her obesity treatment plan along with follow-up of her obesity related diagnoses. [name] is keeping a food journal and adhering to recommended goals of 1200 calories and 80 grams of protein and states she is following her eating plan approximately 85% of the time. [name] states she is exercising 0 minutes 0 times per week.  Today's visit was #: 15 Starting weight: 277 lbs Starting date: 10/05/2017 Today's weight: 238 lbs Today's date: 12/22/2019 Total lbs lost to date: 39 Total lbs lost since last in-office visit: 4  Interim History: [name] is tracking her calories and protein in a food diary regularly. She is staying around 1200 calories, but is unsure of her protein goal. She is seeing her cardiologist soon for follow-up of her CHF.  Subjective:   Other hyperlipidemia. [name] is on Lipitor. No chest pain or myalgias.   Lab Results  Component Value Date   CHOL 204 (H) 09/27/2019   HDL 52 09/27/2019   LDLCALC 127 (H) 09/27/2019   TRIG 123 09/27/2019   CHOLHDL 3.9 09/27/2019   Lab Results  Component Value Date   ALT 15 09/27/2019   AST 15 09/27/2019   ALKPHOS 60 09/27/2019   BILITOT 0.4 09/27/2019   The 10-year ASCVD risk score [name], et al., 2013) is: 1.5%   Values used to calculate the score:     Age: 80 years     Sex: Female     Is Non-Hispanic African American: Yes     Diabetic: No     Tobacco smoker: No     Systolic Blood Pressure: 681 mmHg     Is BP treated: Yes     HDL Cholesterol: 52 mg/dL     Total Cholesterol: 204 mg/dL  Insulin resistance. [name] has a diagnosis of insulin resistance based on her elevated fasting insulin level >5. She continues to work on diet and exercise to decrease her risk of diabetes. [name] is on [place]. No polyphagia.  Lab Results  Component Value Date   INSULIN 16.6 12/01/2019   INSULIN 19.8 07/04/2019   INSULIN 26.5 (H) 01/25/2019   INSULIN 15.6  08/17/2018   INSULIN 7.9 03/10/2018   Lab Results  Component Value Date   HGBA1C 5.3 12/01/2019   Assessment/Plan:   Other hyperlipidemia. Cardiovascular risk and specific lipid/LDL goals reviewed.  We discussed several lifestyle modifications today and [name] will continue to work on diet, exercise and weight loss efforts. Orders and follow up as documented in patient record. She will continue her medication as directed.   Counseling Intensive lifestyle modifications are the first line treatment for this issue. . Dietary changes: Increase soluble fiber. Decrease simple carbohydrates. . Exercise changes: Moderate to vigorous-intensity aerobic activity 150 minutes per week if tolerated. . Lipid-lowering medications: see documented in medical record.  Insulin resistance. [name] will continue to work on weight loss, exercise, and decreasing simple carbohydrates to help decrease the risk of diabetes. [name] agreed to follow-up with [name] as directed to closely monitor her progress. She will continue her medication as directed.   Class 3 severe obesity with serious comorbidity and body mass index (BMI) of 40.0 to 44.9 in adult, unspecified obesity type ([name]).  [name] is currently in the action stage of change. As such, her goal is to continue with weight loss efforts. She has agreed to keeping a food journal and adhering to recommended goals of 1200 calories and  80 grams of protein daily.   Exercise goals: For substantial health benefits, adults should do at least 150 minutes (2 hours and 30 minutes) a week of moderate-intensity, or 75 minutes (1 hour and 15 minutes) a week of vigorous-intensity aerobic physical activity, or an equivalent combination of moderate- and vigorous-intensity aerobic activity. Aerobic activity should be performed in episodes of at least 10 minutes, and preferably, it should be spread throughout the week.  Behavioral modification strategies: meal planning and cooking strategies  and keeping healthy foods in the home.  [name] has agreed to follow-up with our clinic in 3 weeks. She was informed of the importance of frequent follow-up visits to maximize her success with intensive lifestyle modifications for her multiple health conditions.   Objective:   Blood pressure 107/76, pulse 78, temperature 98.1 F (36.7 C), temperature source Oral, height 5\' 3"  (1.6 m), weight 238 lb (108 kg), SpO2 98 %. Body mass index is 42.16 kg/m.  General: Cooperative, alert, well developed, in no acute distress. HEENT: Conjunctivae and lids unremarkable. Cardiovascular: Regular rhythm.  Lungs: Normal work of breathing. Neurologic: No focal deficits.   Lab Results  Component Value Date   CREATININE 0.95 10/07/2019   BUN 14 10/07/2019   NA 139 10/07/2019   K 4.2 10/07/2019   CL 109 10/07/2019   CO2 24 10/07/2019   Lab Results  Component Value Date   ALT 15 09/27/2019   AST 15 09/27/2019   ALKPHOS 60 09/27/2019   BILITOT 0.4 09/27/2019   Lab Results  Component Value Date   HGBA1C 5.3 12/01/2019   HGBA1C 5.3 07/04/2019   HGBA1C 5.2 01/25/2019   HGBA1C 5.4 08/17/2018   HGBA1C 5.4 03/10/2018   Lab Results  Component Value Date   INSULIN 16.6 12/01/2019   INSULIN 19.8 07/04/2019   INSULIN 26.5 (H) 01/25/2019   INSULIN 15.6 08/17/2018   INSULIN 7.9 03/10/2018   Lab Results  Component Value Date   TSH 3.053 09/27/2019   Lab Results  Component Value Date   CHOL 204 (H) 09/27/2019   HDL 52 09/27/2019   LDLCALC 127 (H) 09/27/2019   TRIG 123 09/27/2019   CHOLHDL 3.9 09/27/2019   Lab Results  Component Value Date   WBC 12.8 (H) 09/27/2018   HGB 13.4 09/27/2018   HCT 44.5 09/27/2018   MCV 81.8 09/27/2018   PLT 343 09/27/2018   No results found for: IRON, TIBC, FERRITIN  Attestation Statements:   Reviewed by clinician on day of visit: allergies, medications, problem list, medical history, surgical history, family history, social history, and previous  encounter notes.  Time spent on visit including pre-visit chart review and post-visit charting and care was 30 minutes.   IMichaelene Song, am acting as transcriptionist for Abby Potash, PA-C   I have reviewed the above documentation for accuracy and completeness, and I agree with the above. Abby Potash, PA-C

## 2019-12-23 NOTE — Progress Notes (Signed)
Remote ICD transmission.   

## 2019-12-28 ENCOUNTER — Ambulatory Visit (INDEPENDENT_AMBULATORY_CARE_PROVIDER_SITE_OTHER): Payer: BC Managed Care – PPO

## 2019-12-28 DIAGNOSIS — Z95810 Presence of automatic (implantable) cardiac defibrillator: Secondary | ICD-10-CM

## 2019-12-28 DIAGNOSIS — I5022 Chronic systolic (congestive) heart failure: Secondary | ICD-10-CM

## 2019-12-28 NOTE — Progress Notes (Signed)
EPIC Encounter for ICM Monitoring  Patient Name: [name] is a 49 y.o. female Date: 12/28/2019 Primary Care Physican: [name], MD Primary Cardiologist:[place] Electrophysiologist:Klein 6/29/2021Weight: 243 lbs 12/28/2019 Weight:   238 lbs  Spoke with patient and breathing is slightly improved but still feels "winded" when doing household activities such as cleaning.  OptivolThoracicimpedance returned to normal after taking extra Torsemide x 2 days.  Prescribed:  Torsemide20 mgTake 4 tablets(80 mg total)by mouth every morning and 2 tablets(40 mg total)by mouth every evening. Take an extra tab as needed  Potassium 20 mEqTake2tablets (40 mEq total) by mouthdaily  Spironolactone 25 mg take 1 tabletat bedtime.   Metolazone 2.5 mgTake 2.5 mg by mouth as needed (extra fluid as directed by HF clinic).  Labs: 10/07/2019 Creatinine 0.95, BUN 14, Potassium 4.2, Sodium 139, GFR >60 09/27/2019 Creatinine 0.95, BUN 15, Potassium 3.7, Sodium 138, GFR >60 07/08/2019 Creatinine1.05, BUN20, Potassium3.9, Sodium135, GFR>60 06/28/2019 Creatinine0.99, BUN13, Potassium3.8, Sodium141, GFR>60 A complete set of results can be found in Results Review.  Recommendations: She will discuss with Dr [name] that she feels "winded" when cleaning house and not able to catch her breath.  Advised to call if she experiences any fluid symptoms.  Follow-up plan: ICM clinic phone appointment on 01/23/2020. 91 day device clinic remote transmission9/27/2021.     EP/Cardiology Office Visits: 12/30/2019 with Dr. [name].    Copy of ICM check sent to Dr. [name].   3 month ICM trend: 12/28/2019    1 Year ICM trend:       [name], RN 12/28/2019 4:41 PM

## 2019-12-30 ENCOUNTER — Ambulatory Visit (HOSPITAL_COMMUNITY)
Admission: RE | Admit: 2019-12-30 | Discharge: 2019-12-30 | Disposition: A | Discharge status: Home / Self Care | Payer: BC Managed Care – PPO | Source: Ambulatory Visit | Attending: Cardiology | Admitting: Cardiology

## 2019-12-30 ENCOUNTER — Other Ambulatory Visit: Payer: Self-pay

## 2019-12-30 ENCOUNTER — Encounter (HOSPITAL_COMMUNITY): Payer: Self-pay | Admitting: Cardiology

## 2019-12-30 VITALS — BP 84/60 | HR 64 | Wt 244.0 lb

## 2019-12-30 DIAGNOSIS — E039 Hypothyroidism, unspecified: Secondary | ICD-10-CM | POA: Insufficient documentation

## 2019-12-30 DIAGNOSIS — I472 Ventricular tachycardia, unspecified: Secondary | ICD-10-CM

## 2019-12-30 DIAGNOSIS — Z8249 Family history of ischemic heart disease and other diseases of the circulatory system: Secondary | ICD-10-CM | POA: Insufficient documentation

## 2019-12-30 DIAGNOSIS — I493 Ventricular premature depolarization: Secondary | ICD-10-CM | POA: Insufficient documentation

## 2019-12-30 DIAGNOSIS — Z95810 Presence of automatic (implantable) cardiac defibrillator: Secondary | ICD-10-CM | POA: Diagnosis not present

## 2019-12-30 DIAGNOSIS — E785 Hyperlipidemia, unspecified: Secondary | ICD-10-CM | POA: Diagnosis not present

## 2019-12-30 DIAGNOSIS — Z6841 Body Mass Index (BMI) 40.0 and over, adult: Secondary | ICD-10-CM | POA: Insufficient documentation

## 2019-12-30 DIAGNOSIS — R0683 Snoring: Secondary | ICD-10-CM

## 2019-12-30 DIAGNOSIS — Z79899 Other long term (current) drug therapy: Secondary | ICD-10-CM | POA: Insufficient documentation

## 2019-12-30 DIAGNOSIS — Z79890 Hormone replacement therapy (postmenopausal): Secondary | ICD-10-CM | POA: Diagnosis not present

## 2019-12-30 DIAGNOSIS — I272 Pulmonary hypertension, unspecified: Secondary | ICD-10-CM | POA: Insufficient documentation

## 2019-12-30 DIAGNOSIS — I110 Hypertensive heart disease with heart failure: Secondary | ICD-10-CM | POA: Diagnosis not present

## 2019-12-30 DIAGNOSIS — I428 Other cardiomyopathies: Secondary | ICD-10-CM | POA: Insufficient documentation

## 2019-12-30 DIAGNOSIS — I5022 Chronic systolic (congestive) heart failure: Secondary | ICD-10-CM

## 2019-12-30 DIAGNOSIS — Z7984 Long term (current) use of oral hypoglycemic drugs: Secondary | ICD-10-CM | POA: Insufficient documentation

## 2019-12-30 LAB — COMPREHENSIVE METABOLIC PANEL
ALT: 12 U/L (ref 0–44)
AST: 17 U/L (ref 15–41)
Albumin: 3.5 g/dL (ref 3.5–5.0)
Alkaline Phosphatase: 69 U/L (ref 38–126)
Anion gap: 9 (ref 5–15)
BUN: 18 mg/dL (ref 6–20)
CO2: 24 mmol/L (ref 22–32)
Calcium: 9.1 mg/dL (ref 8.9–10.3)
Chloride: 105 mmol/L (ref 98–111)
Creatinine, Ser: 1.22 mg/dL — ABNORMAL HIGH (ref 0.44–1.00)
GFR calc Af Amer: >60 mL/min (ref >60)
GFR calc non Af Amer: 52 mL/min — ABNORMAL LOW (ref >60)
Glucose, Bld: 89 mg/dL (ref 70–99)
Potassium: 3.5 mmol/L (ref 3.5–5.1)
Sodium: 138 mmol/L (ref 135–145)
Total Bilirubin: 0.3 mg/dL (ref 0.3–1.2)
Total Protein: 7.8 g/dL (ref 6.5–8.1)

## 2019-12-30 LAB — TSH: TSH: 4.774 u[IU]/mL — ABNORMAL HIGH (ref 0.350–4.500)

## 2019-12-30 NOTE — Patient Instructions (Signed)
It was great to see you today! No medication changes are needed at this time.  Labs today We will only contact you if something comes back abnormal or we need to make some changes. Otherwise no news is good news!  Your physician has recommended that you have a cardiopulmonary stress test (CPX). CPX testing is a non-invasive measurement of heart and lung function. It replaces a traditional treadmill stress test. This type of test provides a tremendous amount of information that relates not only to your present condition but also for future outcomes. This test combines measurements of you ventilation, respiratory gas exchange in the lungs, electrocardiogram (EKG), blood pressure and physical response before, during, and following an exercise protocol.  Your provider has recommended that you have a home sleep study.  [employer] is the company that does these test.  They will contact you by phone and must speak with you before they can ship the equipment.  Once they have spoken with you they will send the equipment right to your home with instructions on how to set it up.  Once you have completed the test you just dispose of the equipment, the information is automatically uploaded to [name] via blue-tooth technology.  IF you have any questions or issues with the equipment please call the company directly at [phone].  If your test is positive for sleep apnea and you need a home CPAP machine you will be contacted by Dr Theodosia Blender office [facility]) to set this up.  Your physician recommends that you schedule a follow-up appointment in: 3 months with Dr [name]  Do the following things EVERYDAY: 1) Weigh yourself in the morning before breakfast. Write it down and keep it in a log. 2) Take your medicines as prescribed 3) Eat low salt foods--Limit salt (sodium) to 2000 mg per day.  4) Stay as active as you can everyday 5) Limit all fluids for the day to less than 2 liters  At the [facility], you and your health needs are our priority. As part of our continuing mission to provide you with exceptional heart care, we have created designated Provider Care Teams. These Care Teams include your primary Cardiologist (physician) and Advanced Practice Providers (APPs- Physician Assistants and Nurse Practitioners) who all work together to provide you with the care you need, when you need it.   You may see any of the following providers on your designated Care Team at your next follow up: Marland Kitchen Dr Glori Bickers . Dr [name] . Darrick Grinder, NP . Lyda Jester, PA . Audry Riles, PharmD   Please be sure to bring in all your medications bottles to every appointment.   Marland Kitchen

## 2019-12-30 NOTE — Progress Notes (Signed)
Patient Name: [name]         DOB: [birth year]      Height: 244    Weight:5 3  Office Name:[facility]         Referring Provider: [name]  Today's Date:12/30/19  Date:   STOP BANG RISK ASSESSMENT S (snore) Have you been told that you snore?     YES   T (tired) Are you often tired, fatigued, or sleepy during the day?   YES  O (obstruction) Do you stop breathing, choke, or gasp during sleep? YES   P (pressure) Do you have or are you being treated for high blood pressure? NO   B (BMI) Is your body index greater than 35 kg/m? YES   A (age) Are you 40 years old or older? NO   N (neck) Do you have a neck circumference greater than 16 inches?   NO   G (gender) Are you a female? NO   TOTAL STOP/BANG "YES" ANSWERS                                                                        For Office Use Only              Procedure Order Form    YES to 3+ Stop Bang questions OR two clinical symptoms - patient qualifies for WatchPAT (CPT 95800)     Submit: This Form + Patient Face Sheet + Clinical Note via CloudPAT or Fax: [phone]         Clinical Notes: Will consult Sleep Specialist and refer for management of therapy due to patient increased risk of Sleep Apnea. Ordering a sleep study due to the following two clinical symptoms: Excessive daytime sleepiness G47.10 / Gastroesophageal reflux K21.9 / Nocturia R35.1 / Morning Headaches G44.221 / Difficulty concentrating R41.840 / Memory problems or poor judgment G31.84 / Personality changes or irritability R45.4 / Loud snoring R06.83 / Depression F32.9 / Unrefreshed by sleep G47.8 / Impotence N52.9 / History of high blood pressure R03.0 / Insomnia G47.00    I understand that I am proceeding with a home sleep apnea test as ordered by my treating physician. I understand that untreated sleep apnea is a serious cardiovascular risk factor and it is my responsibility to perform the test and seek management for sleep apnea. I  will be contacted with the results and be managed for sleep apnea by a local sleep physician. I will be receiving equipment and further instructions from [facility]. I shall promptly ship back the equipment via the included mailing label. I understand my insurance will be billed for the test and as the patient I am responsible for any insurance related out-of-pocket costs incurred. I have been provided with written instructions and can call for additional video or telephonic instruction, with 24-hour availability of qualified personnel to answer any questions: Patient Help Desk [phone].  Patient Signature ______________________________________________________   Date______________________ Patient Telemedicine Verbal Consent

## 2019-12-31 NOTE — Progress Notes (Signed)
ID:  [name], DOB 1970/08/21, MRN [id number]    Provider location: [place] [facility] Type of Visit: Established patient   PCP:  [name], MD    Cardiologist:  [name], MD  History of Present Illness: [name] is a 49 y.o. 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  She was followed for systolic CHF initially in [place]. Apparently her EF recovered to 35-40%. However, she was admitted to [facility] 09/19/13 for worsening dyspnea and CP. Coronaries were reportedly OK on LHC at that time at [facility]. Echo showed EF of 15-20% with four-chamber enlargement with moderate-severe TR/MR. RHC as below. Rheumatological w/u negative.  CPX in 9/16 showed near-normal functional capacity.  Repeat echo in 6/17 shows that EF remains 20%. Repeat CPX in 1/18 was submaximal but probably only mild circulatory limitation.  Echo 8/18 showed EF 25-30%, severe LV dilation.  CPX in 6/19 showed low normal functional capacity.  Echo in 10/19 showed EF 20-25%, moderate LV dilation.   Zio patch in 2/20 with 11% PVCs and NSVT, amiodarone started. Zio patch in 3/20 with 13.3% PVCs and NSVT.  She saw Dr. [name], he thought that increased PVCs were electromechanical in nature due to worsening heart failure.  Repeat Zio patch on amiodarone in 1/21 showed 6.2% PVCs.   She was admitted in 4/20 with VT in setting of hypokalemia.    CPX in 9/20 showed mild functional limitation, more due to body habitus than heart failure.  Echo in 4/21 showed EF 20% with severe LV dilation, diffuse hypokinesis, normal RV size and systolic function.   She returns for followup of CHF.  SBP continues to run in the 80s to 90s.  She has rare lightheadedness if she stands up too fast.  Short of breath with stairs at times but not always.  No dyspnea walking on flat ground. She reports daytime sleepiness and  fatigue, snores at night.  No orthopnea/PND.  She thinks that he fatigue overall is worse.    Medtronic device interrogation: fluid index < threshold, no VT   ECG (personally reviewed): NSR, borderline LVH, QRS 126 msec  RHC Procedural Findings: RA mean 5 RV 55/8 PA 56/31, mean 40 PCWP mean 24 Oxygen saturations: PA 61% AO 99% Cardiac Output (Fick) 4.3  Cardiac Index (Fick) 2.0 PVR 3.7 WU  RHC 09/24/13  RA 14  RV 57/22  PA 64/36 (48)  PCWP 18  Fick CO/CI: 3.7/1.6  PVR 8.1 WU  Ao sat 95%  PA sat 47%   CPX 06/27/2016  Peak VO2: 13.5 (56.2% predicted peak VO2) VE/VCO2 slope: 33 OUES: 2.16 Peak RER: 0.98  CPX  1/18 Peak VO2 13.5 VE/VCO2 slope 33 RER 0.98 Submaximal study, mild circulatory limitation, suspect more limited by body habitus.   Echo (8/15) with EF 20%, moderate LV dilation, diffuse hypokinesis, normal RV size with mildly decreased systolic function, mild MR.  Echo (5/16) with EF 20%, spherical LV with diffuse hypokinesis, mild MR.  Echo (6/17) with EF 20%, diffuse hypokinesis, moderate LV dilation, normal RV, mild to moderate MR.  Echo (8/18) with EF 25-30%, severe LV dilation, severe LAE.  Echo (2/20) with EF 20-25%, moderate LV dilation, moderate MR.  Echo (4/21) with EF 20-25%, severe LV dilation, mild-moderate MR, normal RV, PASP 26 mmHg.   Sleep study did not show significant OSA.   RHC (8/15):  Mean RA 3 PA 33/11 mean 19 Mean PCWP 8 CI 2.35  Holter (8/15) with < 1% PVCs Zio patch (1/21) with 6.2% PVCs  CPX 9/16 Peak VO2 16.2 VE/VCO2 slope 23.6 RER 1.25 Normal functional capacity  CPX (6/19): peak VO2 14.3 (83% predicted), VE/VCO2 slope 30 => low normal functional capacity.   CPX (9/20): peak VO2 14.2, VE/VCO2 slope 29, RER 1.13 => mild functional limitation due to body habitus and heart failure.   Labs: (11/09/13): Dig level 0.5, K 3.6, Creatinine 0.83, pro-BNP 622 (12/19/13): K 3.6, creatinine 0.8 (7/15): K 4.1, creatinine  0.91 (8/15): K 3.9, creatinine 0.91 (10/15): K 4, creatinine 0.93, proBNP 129 (12/15): K 4, creatinine 0.9, digoxin 0.4 (5/16): K 4, creatinine 0.86 (9/16): K 3.6, creatinine 0.86, digoxin < 0.2 (5/17): K 4.1, creatinine 0.95, digoxin 0.3 (5/18): K 3.4, creatinine 0.95, TSH elevated but free T3 and free T4 normal.  (8/18): K 3.9, creatinine 0.87, BNP 108  (11/18): K 3.5, creatinine 1.04, digoxin 0.4 (1/19): LDL 91, HDL 47 (3/19): digoxin 0.7, K 4, creatinine 0.99 (4/19): TSH normal, LDL 95 (9/19): LDL 99, HDL 45, K 4, creatinine 0.96 (10/19): K 3.6, creatinine 0.85, digoxin 0.5 (2/20): LDL 103 (3/20): K 3.9, creatinine 1.05 (4/20): K 3.9, creatinine 1.23 => 1.15, TSH 8, digoxin 0.3, LFTs normal 6/20): K 4.3, creatinine 1.07, LFTs normal, digoxin 0.5 (7/20): K 3.8, creatinine 1.16 (8/20): digoxin < 0.2, LDL 135 (1/21): K 3.9, creatinine 1.05, LDL 171  (4/21): K 4.2, creatinine 0.95, LDL 127, TSH normal, LFTs normal  Current Outpatient Medications  Medication Sig Dispense Refill  . amiodarone (PACERONE) 200 MG tablet Take 1 tablet (200 mg total) by mouth daily. 90 tablet 3  . atorvastatin (LIPITOR) 40 MG tablet TAKE 1 TABLET BY MOUTH ONCE A DAY 30 tablet 5  . carvedilol (COREG) 25 MG tablet TAKE 1 TABLET BY MOUTH TWICE A DAY WITH A MEAL 180 tablet 3  . CORLANOR 5 MG TABS tablet TAKE 1 TABLET BY MOUTH TWICE (2) DAILY WITH MEALS 60 tablet 6  . dapagliflozin propanediol (FARXIGA) 10 MG TABS tablet Take 10 mg by mouth daily before breakfast. 30 tablet 5  . digoxin (LANOXIN) 0.125 MG tablet TAKE 1 TABLET BY MOUTH ONCE A DAY 30 tablet 5  . levothyroxine (SYNTHROID) 25 MCG tablet Take 1 tablet (25 mcg total) by mouth daily before breakfast. 90 tablet 3  . metolazone (ZAROXOLYN) 2.5 MG tablet Take 2.5 mg by mouth as needed (extra fluid as directed by HF clinic).    . potassium chloride SA (KLOR-CON) 20 MEQ tablet TAKE 2 TABLETS BY MOUTH DAILY 60 tablet 5  . sacubitril-valsartan (ENTRESTO)  49-51 MG Take 1 tablet by mouth 2 (two) times daily. 60 tablet 5  . spironolactone (ALDACTONE) 25 MG tablet TAKE 1 TABLET BY MOUTH EVERY NIGHT AT BEDTIME 90 tablet 3  . torsemide (DEMADEX) 20 MG tablet Take 4 tablets (80 mg total) by mouth every morning AND 2 tablets (40 mg total) every evening. 540 tablet 3  . Vitamin D, Ergocalciferol, (DRISDOL) 1.25 MG (50000 UNIT) CAPS capsule Take 1 capsule (50,000 Units total) by mouth every 7 (seven) days. 4 capsule 0   No current facility-administered medications for this encounter.    Allergies:   Patient has no known allergies.   Social History:  The patient  reports that she has never smoked. She has never used smokeless tobacco. She reports that she does not drink alcohol and does not use drugs.   Family  History:  The patient's family history includes Heart disease in her maternal grandmother and mother; High blood pressure in her father; Obesity in her mother; Stroke in her father.   ROS:  Please see the history of present illness.   All other systems are personally reviewed and negative.   Exam:   BP (!) 84/60   Pulse 64   Wt 110.7 kg (244 lb)   SpO2 99%   BMI 43.22 kg/m  General: NAD Neck: No JVD, no thyromegaly or thyroid nodule.  Lungs: Clear to auscultation bilaterally with normal respiratory effort. CV: Nondisplaced PMI.  Heart regular S1/S2, no S3/S4, no murmur.  No peripheral edema.  No carotid bruit.  Normal pedal pulses.  Abdomen: Soft, nontender, no hepatosplenomegaly, no distention.  Skin: Intact without lesions or rashes.  Neurologic: Alert and oriented x 3.  Psych: Normal affect. Extremities: No clubbing or cyanosis.  HEENT: Normal.   Recent Labs: 12/30/2019: ALT 12; BUN 18; Creatinine, Ser 1.22; Potassium 3.5; Sodium 138; TSH 4.774  Personally reviewed   Wt Readings from Last 3 Encounters:  12/30/19 110.7 kg (244 lb)  12/22/19 108 kg (238 lb)  12/01/19 109.8 kg (242 lb)      ASSESSMENT AND PLAN:  1. Chronic  systolic HF: Nonischemic cardiomyopathy.  She has a Medtronic ICD.  Cause for cardiomyopathy is uncertain: initially noted peri-partum (after son's birth), so cannot rule out peri-partum CMP.  On and off, she has had frequent PVCs.   RHC in 8/15 showed normal filling pressures and preserved cardiac output.  She does not qualify for CRT with narrow QRS.  CPX 9/20 with mildly decreased functional capacity due to body habitus and HF.  Echo in 4/21 showed EF 20-25% with severe LV dilation and mild-moderate MR. She does not look volume overloaded on exam and thoracic impedance stable on Medtronic device. NYHA class II overall but reports more fatigue than in the past.  Has some symptoms that point to possible OSA.  BP is low but minimal lightheadedness.   - With increased fatigue, I will have her do a repeat CPX.   - Continue torsemide 80 qam/40 qpm.  BMET today.    - Continue Entresto 49/51 bid, no BP room to increase.    - Continue Coreg 25 mg bid and ivabradine 5 mg bid.   - Continue spironolactone 25 mg daily and digoxin, check digoxin level today.   - Continue dapagliflozin 10 mg daily.  - EF remains low, if she worsens clinically would be candidate for LVAD.  BMI too high currently for transplant.   2. Pulmonary HTN: Mixed picture with PVR 8.1 on RHC at [facility].  However, repeat study in 8/15 here showed no significant pulmonary hypertension.  3. Morbid obesity: Continue to work on weight loss.  [facility]  Weight is coming down, she has lost 10 lbs since last appointment.  4. Hyperlipidemia: Continue atorvastatin.  5. PVCs: Worsened with 11% PVCs + NSVT on 2/20 Zio patch.  Amiodarone started, but PVCs up to 13.3% with NSVT on 3/20 Zio patch. She saw Dr. [name] who thought that the ventricular arrhythmias were electromechanical due to worsening HF.  In 4/20, she had VT x 2 with ICD discharges in the setting of hypokalemia. Zio patch in 1/21 with PVCs down to  6.2% of beats.  - Continue amiodarone, check LFTs and TSH today.  She will need a regular eye exam with amiodarone use.  6. Hypothyroidism:  She is on levothyroxine now. TSH today.   Recommended follow-up:  3 months  Signed, [name], MD  12/31/2019  [facility] [address] [facility] and [place] [address] [phone] (office) [phone] (fax)

## 2020-01-02 NOTE — Progress Notes (Signed)
Order, OV note, stop bang and demographics all faxed to [employer] at [phone]  

## 2020-01-02 NOTE — Addendum Note (Signed)
Encounter addended by: Kerry Dory, CMA on: 01/02/2020 4:21 PM  Actions taken: Visit diagnoses modified, Order list changed, Diagnosis association updated, Clinical Note Signed

## 2020-01-17 ENCOUNTER — Ambulatory Visit (INDEPENDENT_AMBULATORY_CARE_PROVIDER_SITE_OTHER): Payer: BC Managed Care – PPO | Admitting: Physician Assistant

## 2020-01-17 ENCOUNTER — Encounter (INDEPENDENT_AMBULATORY_CARE_PROVIDER_SITE_OTHER): Payer: Self-pay | Admitting: Physician Assistant

## 2020-01-17 ENCOUNTER — Other Ambulatory Visit: Payer: Self-pay

## 2020-01-17 VITALS — BP 82/48 | HR 80 | Temp 97.8°F | Ht 63.0 in | Wt 240.0 lb

## 2020-01-17 DIAGNOSIS — E559 Vitamin D deficiency, unspecified: Secondary | ICD-10-CM | POA: Diagnosis not present

## 2020-01-17 DIAGNOSIS — I5022 Chronic systolic (congestive) heart failure: Secondary | ICD-10-CM

## 2020-01-17 DIAGNOSIS — Z6841 Body Mass Index (BMI) 40.0 and over, adult: Secondary | ICD-10-CM

## 2020-01-17 DIAGNOSIS — Z9189 Other specified personal risk factors, not elsewhere classified: Secondary | ICD-10-CM | POA: Diagnosis not present

## 2020-01-17 MED ORDER — VITAMIN D (ERGOCALCIFEROL) 1.25 MG (50000 UNIT) PO CAPS: 50000.0000 [IU] | ORAL_CAPSULE | ORAL | 0 refills | Status: DC

## 2020-01-17 NOTE — Progress Notes (Signed)
Chief Complaint:   OBESITY [name] is here to discuss her progress with her obesity treatment plan along with follow-up of her obesity related diagnoses. [name] is on the Category 2 Plan and journaling and states she is following her eating plan approximately 75% of the time. [name] states she is exercising 0 minutes 0 times per week.  Today's visit was #: 5 Starting weight: 277 lbs Starting date: 10/05/2017 Today's weight: 240 lbs Today's date: 01/17/2020 Total lbs lost to date: 37 Total lbs lost since last in-office visit: 0  Interim History: [name] recently saw her cardiologist who sent her for a sleep study and states that she is going to have a "breathing test" (CPX) on August 10. She reports that she is not getting all of the food in on the plan due to making her nauseous.  Subjective:   Vitamin D deficiency. [name] is taking prescription Vitamin D without complication.   Ref. Range 12/01/2019 16:12  Vitamin D, 25-Hydroxy Latest Ref Range: 30.0 - 100.0 ng/mL 15.4   Systolic heart failure, chronic.  [name] is managed by Cardiology. She had a follow-up with them 2 weeks ago and is scheduled for more testing in 2 weeks.  At risk for osteoporosis. [name] is at higher risk of osteopenia and osteoporosis due to Vitamin D deficiency.   Assessment/Plan:   Vitamin D deficiency. Low Vitamin D level contributes to fatigue and are associated with obesity, breast, and colon cancer. She was given a refill on her Vitamin D, Ergocalciferol, (DRISDOL) 1.25 MG (50000 UNIT) CAPS capsule every week #4 with 0 refills and will follow-up for routine testing of Vitamin D, at least 2-3 times per year to avoid over-replacement.   Systolic heart failure, chronic. [name] will follow-up with Cardiology as directed and as scheduled.  At risk for osteoporosis. [name] was given approximately 15 minutes of osteoporosis prevention counseling today. [name] is at risk for osteopenia and osteoporosis due to her  Vitamin D deficiency. She was encouraged to take her Vitamin D and follow her higher calcium diet and increase strengthening exercise to help strengthen her bones and decrease her risk of osteopenia and osteoporosis.  Repetitive spaced learning was employed today to elicit superior memory formation and behavioral change.  Class 3 severe obesity with serious comorbidity and body mass index (BMI) of 40.0 to 44.9 in adult, unspecified obesity type ([name]).  [name] is currently in the action stage of change. As such, her goal is to continue with weight loss efforts. She has agreed to keeping a food journal and adhering to recommended goals of 1200 calories and 85 grams of protein daily.   Exercise goals: For substantial health benefits, adults should do at least 150 minutes (2 hours and 30 minutes) a week of moderate-intensity, or 75 minutes (1 hour and 15 minutes) a week of vigorous-intensity aerobic physical activity, or an equivalent combination of moderate- and vigorous-intensity aerobic activity. Aerobic activity should be performed in episodes of at least 10 minutes, and preferably, it should be spread throughout the week.  Behavioral modification strategies: meal planning and cooking strategies and keeping healthy foods in the home.  [name] has agreed to follow-up with our clinic in 3 weeks. She was informed of the importance of frequent follow-up visits to maximize her success with intensive lifestyle modifications for her multiple health conditions.   Objective:   Blood pressure (!) 82/48, pulse 80, temperature 97.8 F (36.6 C), temperature source Oral, height 5\' 3"  (1.6 m), weight Marland Kitchen)  240 lb (108.9 kg), last menstrual period 12/26/2019, SpO2 100 %. Body mass index is 42.51 kg/m.  General: Cooperative, alert, well developed, in no acute distress. HEENT: Conjunctivae and lids unremarkable. Cardiovascular: Regular rhythm.  Lungs: Normal work of breathing. Neurologic: No focal deficits.    Lab Results  Component Value Date   CREATININE 1.22 (H) 12/30/2019   BUN 18 12/30/2019   NA 138 12/30/2019   K 3.5 12/30/2019   CL 105 12/30/2019   CO2 24 12/30/2019   Lab Results  Component Value Date   ALT 12 12/30/2019   AST 17 12/30/2019   ALKPHOS 69 12/30/2019   BILITOT 0.3 12/30/2019   Lab Results  Component Value Date   HGBA1C 5.3 12/01/2019   HGBA1C 5.3 07/04/2019   HGBA1C 5.2 01/25/2019   HGBA1C 5.4 08/17/2018   HGBA1C 5.4 03/10/2018   Lab Results  Component Value Date   INSULIN 16.6 12/01/2019   INSULIN 19.8 07/04/2019   INSULIN 26.5 (H) 01/25/2019   INSULIN 15.6 08/17/2018   INSULIN 7.9 03/10/2018   Lab Results  Component Value Date   TSH 4.774 (H) 12/30/2019   Lab Results  Component Value Date   CHOL 204 (H) 09/27/2019   HDL 52 09/27/2019   LDLCALC 127 (H) 09/27/2019   TRIG 123 09/27/2019   CHOLHDL 3.9 09/27/2019   Lab Results  Component Value Date   WBC 12.8 (H) 09/27/2018   HGB 13.4 09/27/2018   HCT 44.5 09/27/2018   MCV 81.8 09/27/2018   PLT 343 09/27/2018   No results found for: IRON, TIBC, FERRITIN  Attestation Statements:   Reviewed by clinician on day of visit: allergies, medications, problem list, medical history, surgical history, family history, social history, and previous encounter notes.  IMichaelene Clark, am acting as transcriptionist for Miranda Potash, PA-C   I have reviewed the above documentation for accuracy and completeness, and I agree with the above. Miranda Potash, PA-C

## 2020-01-23 ENCOUNTER — Ambulatory Visit (INDEPENDENT_AMBULATORY_CARE_PROVIDER_SITE_OTHER): Payer: BC Managed Care – PPO

## 2020-01-23 DIAGNOSIS — I5022 Chronic systolic (congestive) heart failure: Secondary | ICD-10-CM

## 2020-01-23 DIAGNOSIS — Z95810 Presence of automatic (implantable) cardiac defibrillator: Secondary | ICD-10-CM | POA: Diagnosis not present

## 2020-01-23 NOTE — Progress Notes (Signed)
EPIC Encounter for ICM Monitoring  Patient Name: [name] is a 49 y.o. female Date: 01/23/2020 Primary Care Physican: [name], MD Primary Cardiologist:[place] Electrophysiologist:Klein 01/24/2020 Weight:   238 lbs  Spoke with patient and she reports sitting in recliner for past 2 nights to breath better and has a cough. She self adjusted Torsemide and took extra yesterday and today.  OptivolThoracicimpedancesuggesting possible fluid accumulation since 01/15/2020.  Prescribed:  Torsemide20 mgTake 4 tablets(80 mg total)by mouth every morning and 2 tablets(40 mg total)by mouth every evening.   Potassium 20 mEqTake2tablets (40 mEq total) by mouthdaily  Spironolactone 25 mg take 1 tabletat bedtime.   Metolazone 2.5 mgTake 2.5 mg by mouth as needed (extra fluid as directed by HF clinic).  Labs: 12/30/2019 Creatinine 1.22, BUN 18, Potassium 3.5, Sodium 138, GFR 52->60 10/07/2019 Creatinine 0.95, BUN 14, Potassium 4.2, Sodium 139, GFR >60 09/27/2019 Creatinine 0.95, BUN 15, Potassium 3.7, Sodium 138, GFR >60 07/08/2019 Creatinine1.05, BUN20, Potassium3.9, Sodium135, GFR>60 06/28/2019 Creatinine0.99, BUN13, Potassium3.8, Sodium141, GFR>60 A complete set of results can be found in Results Review.  Recommendations:  She self adjusted Torsemide and took extra 8/2 & 8/3.  Follow-up plan: ICM clinic phone appointment on8/02/2020 to recheck fluid levels. 91 day device clinic remote transmission9/27/2021.     EP/Cardiology Office Visits:  Recalls for 03/29/2020 with Dr. [name] and 07/20/2020 with Dr [name].    Copy of ICM check sent to Dr. [name] and Dr [name].  3 month ICM trend: 01/23/2020    1 Year ICM trend:       [name], RN 01/23/2020 2:03 PM

## 2020-01-25 ENCOUNTER — Telehealth: Payer: Self-pay | Admitting: Internal Medicine

## 2020-01-25 NOTE — Telephone Encounter (Signed)
Carelink alert received- Alert 1 VF event 01/24/20, EGM shows VT >230bpm, no change w/ ATP, 36j shock terminated. Also noted is elevated optivol and thoracic impedance below baseline.   Spoke with pt.  At time of event she was sitting/ resting.  She reports she felt lightheaded just prior to event, symptoms resolved after receiving shock.  Educated pt on shock plan and need to report to ED if she received a shock and is experiencing any cardiac symptoms or if she received another shock within 24 hours regardless of symptoms.  Pt reports that she has been taking extra tab of torsemide as instructed previously by Dr. [name] due to fluid retention.  Pt wondering if her potassium may have been low last night since she did not take an extra potassium tab with the extra torsemide.  Due to the shock she received she was concerned so she did not take fluid pills or potassium last night.    Pt weight yesterday was 238lbs, todays weight is 245lbs.    Scheduled pt with next avail in-clinic appt with Tommye Standard, PA on 8/18.  Advised that I would also forward message to Dr. [name] as he may want labs or med adjustments due to fluid levels/ weight gain.

## 2020-01-25 NOTE — Progress Notes (Signed)
New recommendations have been given since patient had device shock after the ICM report was sent.  See 01/25/2020 phone note for instructions by Dr [name]

## 2020-01-25 NOTE — Telephone Encounter (Signed)
Spoke w/pt, appt sch for tomorrow at 9:30

## 2020-01-25 NOTE — Telephone Encounter (Signed)
Spoke with pt and advised per Dr [name] please restart Torsemide 80mg  - qam and 40mg  - qpm along with her scheduled potassium.   Pt advised will have CHF staff contact her re: appointment and labs with [place] tomorrow.  Pt verbalizes understanding and agrees with current plan.

## 2020-01-25 NOTE — Progress Notes (Signed)
Take torsemide 80 bid x 3 days then 80 qam/60 qpm.  Increase KCl to 40 bid x 3 days then 40 qam/20 qpm.  BMET 1 week.  Needs followup appt.

## 2020-01-25 NOTE — Telephone Encounter (Signed)
Have her restart torsemide 80 qam/40 qpm along with her scheduled K. She will need to be seen in APP clinic by [facility] tomorrow with BMET and Mg drawn and sent off.

## 2020-01-25 NOTE — Telephone Encounter (Signed)
  1. Has your device fired? Yes, last night  2. Is you device beeping? no  3. Are you experiencing draining or swelling at device site? no  4. Are you calling to see if we received your device transmission? no  5. Have you passed out? no   Patient states her device fired last night. She states she feels fine.  Please route to [place]

## 2020-01-26 ENCOUNTER — Other Ambulatory Visit: Payer: Self-pay

## 2020-01-26 ENCOUNTER — Telehealth (HOSPITAL_COMMUNITY): Payer: Self-pay | Admitting: Cardiology

## 2020-01-26 ENCOUNTER — Encounter: Payer: Self-pay | Admitting: Internal Medicine

## 2020-01-26 ENCOUNTER — Encounter (HOSPITAL_COMMUNITY): Payer: Self-pay

## 2020-01-26 ENCOUNTER — Encounter (INDEPENDENT_AMBULATORY_CARE_PROVIDER_SITE_OTHER): Payer: BC Managed Care – PPO | Admitting: Cardiology

## 2020-01-26 ENCOUNTER — Ambulatory Visit (HOSPITAL_COMMUNITY)
Admission: RE | Admit: 2020-01-26 | Discharge: 2020-01-26 | Disposition: A | Discharge status: Home / Self Care | Payer: BC Managed Care – PPO | Source: Ambulatory Visit | Attending: Cardiology | Admitting: Cardiology

## 2020-01-26 VITALS — BP 108/92 | HR 88 | Wt 247.0 lb

## 2020-01-26 DIAGNOSIS — I5023 Acute on chronic systolic (congestive) heart failure: Secondary | ICD-10-CM | POA: Insufficient documentation

## 2020-01-26 DIAGNOSIS — Z6841 Body Mass Index (BMI) 40.0 and over, adult: Secondary | ICD-10-CM | POA: Insufficient documentation

## 2020-01-26 DIAGNOSIS — E039 Hypothyroidism, unspecified: Secondary | ICD-10-CM | POA: Insufficient documentation

## 2020-01-26 DIAGNOSIS — E785 Hyperlipidemia, unspecified: Secondary | ICD-10-CM | POA: Insufficient documentation

## 2020-01-26 DIAGNOSIS — Z79890 Hormone replacement therapy (postmenopausal): Secondary | ICD-10-CM | POA: Diagnosis not present

## 2020-01-26 DIAGNOSIS — I428 Other cardiomyopathies: Secondary | ICD-10-CM | POA: Diagnosis not present

## 2020-01-26 DIAGNOSIS — Z8249 Family history of ischemic heart disease and other diseases of the circulatory system: Secondary | ICD-10-CM | POA: Diagnosis not present

## 2020-01-26 DIAGNOSIS — Z79899 Other long term (current) drug therapy: Secondary | ICD-10-CM | POA: Diagnosis not present

## 2020-01-26 DIAGNOSIS — I272 Pulmonary hypertension, unspecified: Secondary | ICD-10-CM | POA: Insufficient documentation

## 2020-01-26 DIAGNOSIS — G4733 Obstructive sleep apnea (adult) (pediatric): Secondary | ICD-10-CM | POA: Diagnosis not present

## 2020-01-26 DIAGNOSIS — G4710 Hypersomnia, unspecified: Secondary | ICD-10-CM | POA: Diagnosis not present

## 2020-01-26 DIAGNOSIS — Z95810 Presence of automatic (implantable) cardiac defibrillator: Secondary | ICD-10-CM | POA: Insufficient documentation

## 2020-01-26 DIAGNOSIS — I5022 Chronic systolic (congestive) heart failure: Secondary | ICD-10-CM | POA: Diagnosis not present

## 2020-01-26 DIAGNOSIS — I493 Ventricular premature depolarization: Secondary | ICD-10-CM | POA: Diagnosis not present

## 2020-01-26 DIAGNOSIS — I110 Hypertensive heart disease with heart failure: Secondary | ICD-10-CM | POA: Diagnosis not present

## 2020-01-26 DIAGNOSIS — R0683 Snoring: Secondary | ICD-10-CM | POA: Diagnosis not present

## 2020-01-26 DIAGNOSIS — Z7984 Long term (current) use of oral hypoglycemic drugs: Secondary | ICD-10-CM | POA: Insufficient documentation

## 2020-01-26 DIAGNOSIS — I10 Essential (primary) hypertension: Secondary | ICD-10-CM | POA: Diagnosis not present

## 2020-01-26 LAB — BASIC METABOLIC PANEL
Anion gap: 8 (ref 5–15)
BUN: 18 mg/dL (ref 6–20)
CO2: 22 mmol/L (ref 22–32)
Calcium: 9.2 mg/dL (ref 8.9–10.3)
Chloride: 106 mmol/L (ref 98–111)
Creatinine, Ser: 1.41 mg/dL — ABNORMAL HIGH (ref 0.44–1.00)
GFR calc Af Amer: 51 mL/min — ABNORMAL LOW (ref >60)
GFR calc non Af Amer: 44 mL/min — ABNORMAL LOW (ref >60)
Glucose, Bld: 102 mg/dL — ABNORMAL HIGH (ref 70–99)
Potassium: 4.2 mmol/L (ref 3.5–5.1)
Sodium: 136 mmol/L (ref 135–145)

## 2020-01-26 LAB — BRAIN NATRIURETIC PEPTIDE: B Natriuretic Peptide: 929.8 pg/mL — ABNORMAL HIGH (ref 0.0–100.0)

## 2020-01-26 LAB — MAGNESIUM: Magnesium: 2.2 mg/dL (ref 1.7–2.4)

## 2020-01-26 LAB — DIGOXIN LEVEL: Digoxin Level: 0.7 ng/mL — ABNORMAL LOW (ref 0.8–2.0)

## 2020-01-26 NOTE — Progress Notes (Signed)
ID:  ANJELITA SHEAHAN, DOB 16-Jun-1971, MRN [id number]    Provider location: [facility] Type of Visit: Established patient   PCP:  [name], MD    Cardiologist:  [name], MD  History of Present Illness: [name] is a 49 y.o. 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  She was followed for systolic CHF initially in [place]. Apparently her EF recovered to 35-40%. However, she was admitted to [facility] 09/19/13 for worsening dyspnea and CP. Coronaries were reportedly OK on LHC at that time at [facility]. Echo showed EF of 15-20% with four-chamber enlargement with moderate-severe TR/MR. RHC as below. Rheumatological w/u negative.  CPX in 9/16 showed near-normal functional capacity.  Repeat echo in 6/17 shows that EF remains 20%. Repeat CPX in 1/18 was submaximal but probably only mild circulatory limitation.  Echo 8/18 showed EF 25-30%, severe LV dilation.  CPX in 6/19 showed low normal functional capacity.  Echo in 10/19 showed EF 20-25%, moderate LV dilation.   Zio patch in 2/20 with 11% PVCs and NSVT, amiodarone started. Zio patch in 3/20 with 13.3% PVCs and NSVT.  She saw Dr. [name], he thought that increased PVCs were electromechanical in nature due to worsening heart failure.  Repeat Zio patch on amiodarone in 1/21 showed 6.2% PVCs.   She was admitted in 4/20 with VT in setting of hypokalemia.    CPX in 9/20 showed mild functional limitation, more due to body habitus than heart failure.  Echo in 4/21 showed EF 20% with severe LV dilation, diffuse hypokinesis, normal RV size and systolic function.   Had clinic f/u 12/30/19 and noted increased fatigue but volume status was stable by device interrogation.  She was ordered to get repeat CPX. Study not yet completed.   About 5 days ago, she developed orthopnea/PND. Had to move from the bed to her recliner.  She sent remote device transmission and Optivol Thoracic impedence suggested possible fluid overload since 7/25. She was instructed to increase her torsemide to 80 mg bid x 3 days, then 80 mg qam/60 mg qpm. However, pt reports today that she increased her dose to 100 mg qam/40 mg qpm and took additional KCl w/ torsemide increase.   On 01/24/20, EP device clinic received Carelink alert after 1 VF event , EGM showed VT >230bpm, no change w/ ATP, 36j shock terminated. Also w/ elevated optivol and thoracic impedance below baseline. There was concern that she had developed hypokalemia from diuretic dose increase leading to her arrhythmia.  She was instructed to return to lower dose of  torsemide 80 qam/40 qpm along with her scheduled KCl.   She now presents to clinic today for f/u and lab work. Here w/ her husband. Orthopnea/PND improved. Was able to sleep in bed last night w/o dyspnea but noted dry nocturnal cough. Denies any further ICD shocks. No exertional dypnea or CP but continues w/ exertional fatigue. She feels that she is still volume overloaded. Based on her wt this morning, she is still ~4 lb above her normal dry wt. Reports compliance w/ diuretics and KCl.   EKG shows NSR w/ LAD and LVH. Qt/QTc ok 418/493 ms.     Medtronic device interrogation: VT/VF event w/ ICD shock x 1 8/3. Impedence still below reference curve but trending up. Optivol index above threshold but starting to trend down   ECG (  personally reviewed): NSR w/ LAD and LVH. Qt/QTc ok 418/493 ms.    RHC Procedural Findings: RA mean 5 RV 55/8 PA 56/31, mean 40 PCWP mean 24 Oxygen saturations: PA 61% AO 99% Cardiac Output (Fick) 4.3  Cardiac Index (Fick) 2.0 PVR 3.7 WU  RHC 09/24/13  RA 14  RV 57/22  PA 64/36 (48)  PCWP 18  Fick CO/CI: 3.7/1.6  PVR 8.1 WU  Ao sat 95%  PA sat 47%   CPX 06/27/2016  Peak VO2: 13.5 (56.2% predicted peak VO2) VE/VCO2 slope: 33 OUES: 2.16 Peak RER: 0.98  CPX  1/18 Peak VO2  13.5 VE/VCO2 slope 33 RER 0.98 Submaximal study, mild circulatory limitation, suspect more limited by body habitus.   Echo (8/15) with EF 20%, moderate LV dilation, diffuse hypokinesis, normal RV size with mildly decreased systolic function, mild MR.  Echo (5/16) with EF 20%, spherical LV with diffuse hypokinesis, mild MR.  Echo (6/17) with EF 20%, diffuse hypokinesis, moderate LV dilation, normal RV, mild to moderate MR.  Echo (8/18) with EF 25-30%, severe LV dilation, severe LAE.  Echo (2/20) with EF 20-25%, moderate LV dilation, moderate MR.  Echo (4/21) with EF 20-25%, severe LV dilation, mild-moderate MR, normal RV, PASP 26 mmHg.   Sleep study did not show significant OSA.   RHC (8/15): Mean RA 3 PA 33/11 mean 19 Mean PCWP 8 CI 2.35  Holter (8/15) with < 1% PVCs Zio patch (1/21) with 6.2% PVCs  CPX 9/16 Peak VO2 16.2 VE/VCO2 slope 23.6 RER 1.25 Normal functional capacity  CPX (6/19): peak VO2 14.3 (83% predicted), VE/VCO2 slope 30 => low normal functional capacity.   CPX (9/20): peak VO2 14.2, VE/VCO2 slope 29, RER 1.13 => mild functional limitation due to body habitus and heart failure.   Labs: (11/09/13): Dig level 0.5, K 3.6, Creatinine 0.83, pro-BNP 622 (12/19/13): K 3.6, creatinine 0.8 (7/15): K 4.1, creatinine 0.91 (8/15): K 3.9, creatinine 0.91 (10/15): K 4, creatinine 0.93, proBNP 129 (12/15): K 4, creatinine 0.9, digoxin 0.4 (5/16): K 4, creatinine 0.86 (9/16): K 3.6, creatinine 0.86, digoxin < 0.2 (5/17): K 4.1, creatinine 0.95, digoxin 0.3 (5/18): K 3.4, creatinine 0.95, TSH elevated but free T3 and free T4 normal.  (8/18): K 3.9, creatinine 0.87, BNP 108  (11/18): K 3.5, creatinine 1.04, digoxin 0.4 (1/19): LDL 91, HDL 47 (3/19): digoxin 0.7, K 4, creatinine 0.99 (4/19): TSH normal, LDL 95 (9/19): LDL 99, HDL 45, K 4, creatinine 0.96 (10/19): K 3.6, creatinine 0.85, digoxin 0.5 (2/20): LDL 103 (3/20): K 3.9, creatinine 1.05 (4/20): K 3.9,  creatinine 1.23 => 1.15, TSH 8, digoxin 0.3, LFTs normal 6/20): K 4.3, creatinine 1.07, LFTs normal, digoxin 0.5 (7/20): K 3.8, creatinine 1.16 (8/20): digoxin < 0.2, LDL 135 (1/21): K 3.9, creatinine 1.05, LDL 171  (4/21): K 4.2, creatinine 0.95, LDL 127, TSH normal, LFTs normal  Current Outpatient Medications  Medication Sig Dispense Refill  . amiodarone (PACERONE) 200 MG tablet Take 1 tablet (200 mg total) by mouth daily. 90 tablet 3  . atorvastatin (LIPITOR) 40 MG tablet TAKE 1 TABLET BY MOUTH ONCE A DAY 30 tablet 5  . carvedilol (COREG) 25 MG tablet TAKE 1 TABLET BY MOUTH TWICE A DAY WITH A MEAL 180 tablet 3  . CORLANOR 5 MG TABS tablet TAKE 1 TABLET BY MOUTH TWICE (2) DAILY WITH MEALS 60 tablet 6  . dapagliflozin propanediol (FARXIGA) 10 MG TABS tablet Take 10 mg by mouth daily before breakfast. 30 tablet 5  . digoxin (  LANOXIN) 0.125 MG tablet TAKE 1 TABLET BY MOUTH ONCE A DAY 30 tablet 5  . levothyroxine (SYNTHROID) 25 MCG tablet Take 1 tablet (25 mcg total) by mouth daily before breakfast. 90 tablet 3  . potassium chloride SA (KLOR-CON) 20 MEQ tablet TAKE 2 TABLETS BY MOUTH DAILY (Patient taking differently: 60 mEq. ) 60 tablet 5  . sacubitril-valsartan (ENTRESTO) 49-51 MG Take 1 tablet by mouth 2 (two) times daily. 60 tablet 5  . spironolactone (ALDACTONE) 25 MG tablet TAKE 1 TABLET BY MOUTH EVERY NIGHT AT BEDTIME 90 tablet 3  . torsemide (DEMADEX) 20 MG tablet Take 4 tablets (80 mg total) by mouth every morning AND 2 tablets (40 mg total) every evening. 540 tablet 3  . Vitamin D, Ergocalciferol, (DRISDOL) 1.25 MG (50000 UNIT) CAPS capsule Take 1 capsule (50,000 Units total) by mouth every 7 (seven) days. 4 capsule 0  . metolazone (ZAROXOLYN) 2.5 MG tablet Take 2.5 mg by mouth as needed (extra fluid as directed by HF clinic). (Patient not taking: Reported on 01/26/2020)     No current facility-administered medications for this encounter.    Allergies:   Patient has no known  allergies.   Social History:  The patient  reports that she has never smoked. She has never used smokeless tobacco. She reports that she does not drink alcohol and does not use drugs.   Family History:  The patient's family history includes Heart disease in her maternal grandmother and mother; High blood pressure in her father; Obesity in her mother; Stroke in her father.   ROS:  Please see the history of present illness.   All other systems are personally reviewed and negative.   Exam:   BP (!) 108/92   Pulse 88   Wt 112 kg (247 lb)   SpO2 97%   BMI 43.75 kg/m  PHYSICAL EXAM: General:  middle aged, moderately obese female, no distress  HEENT: normal Neck: supple. JVD ~ 8 cm. Carotids 2+ bilat; no bruits. No lymphadenopathy or thyromegaly appreciated. Cor: PMI nondisplaced. Regular rate & rhythm. No rubs, gallops or murmurs. Lungs: CTAB  Abdomen: soft, nontender, nondistended. No hepatosplenomegaly. No bruits or masses. Good bowel sounds. Extremities: no cyanosis, clubbing, rash, trace bilateral LE edema Neuro: alert & oriented x 3, cranial nerves grossly intact. moves all 4 extremities w/o difficulty. Affect pleasant.   Recent Labs: 12/30/2019: ALT 12; TSH 4.774 01/26/2020: B Natriuretic Peptide 929.8; BUN 18; Creatinine, Ser 1.41; Magnesium 2.2; Potassium 4.2; Sodium 136  Personally reviewed   Wt Readings from Last 3 Encounters:  01/26/20 112 kg (247 lb)  01/17/20 (!) 108.9 kg (240 lb)  12/30/19 110.7 kg (244 lb)      ASSESSMENT AND PLAN:  1. Acute on Chronic Systolic HF: Nonischemic cardiomyopathy.  She has a Medtronic ICD.  Cause for cardiomyopathy is uncertain: initially noted peri-partum (after son's birth), so cannot rule out peri-partum CMP.  On and off, she has had frequent PVCs.   RHC in 8/15 showed normal filling pressures and preserved cardiac output.  She does not qualify for CRT with narrow QRS.  CPX 9/20 with mildly decreased functional capacity due to body habitus  and HF.  Echo in 4/21 showed EF 20-25% with severe LV dilation and mild-moderate MR.  - She is mildly volume overloaded on exam and by Optivol.   - Plan to increase torsemide to 80 mg bid x 3 days, then reduce to 80 mg qam/60 mg qpm. Will check BMP today given recent VT/VF  to ensure K stable. Will advise on KCl increase based on baseline K level.  - With increased fatigue + NYHA III symptoms, she has been ordered to get repeat CPX   - Continue Entresto 49/51 bid. BP too soft for titration  - Continue Coreg 25 mg bid  - Continue ivabradine 5 mg bid.   - Continue spironolactone 25 mg daily  - Continue digoxin, 0.125. Check digoxin level today.   - Continue dapagliflozin 10 mg daily.  - EF remains low, if she worsens clinically would be candidate for LVAD.  BMI too high currently for transplant.   2. VT/VF w/ ICD Shock: appropriate shock confirmed by device interrogation 8/3 - suspect possibly 2/2 hypokalemia after recent diuretic dose increase (history of VT in the setting of hypokalemia in the past) - check BMP and Mg lab. Adjust KCl +/- add MgSO4 if needed.  - EKG today shows acceptable QT/QTc, 418/493 ms - Continue amiodarone 200 mg daily  - Continue Coreg 25 mg bid  - She has EP clinic f/u arranged 02/08/20 3. Pulmonary HTN: Mixed picture with PVR 8.1 on RHC at [facility].  However, repeat study in 8/15 here showed no significant pulmonary hypertension.  4. Morbid obesity: Continue to work on weight loss.  [facility]   5. Hyperlipidemia: Continue atorvastatin.  5. PVCs: Worsened with 11% PVCs + NSVT on 2/20 Zio patch.  Amiodarone started, but PVCs up to 13.3% with NSVT on 3/20 Zio patch. She saw Dr. [name] who thought that the ventricular arrhythmias were electromechanical due to worsening HF.  In 4/20, she had VT x 2 with ICD discharges in the setting of hypokalemia. Zio patch in 1/21 with PVCs down to 6.2% of beats. Had VT/VF 8/3 treated w/ ICD  discharge. - Continue amiodarone, (LFTs and TSH WNL 7/21).  She will need a regular eye exam with amiodarone use.  - Check BMP and Mg lab today  6. Hypothyroidism: She is on levothyroxine now.  - followed by PCP   F/u in 3-4 weeks.   Signed, Lyda Jester, PA-C  01/26/2020  [facility] [address] [facility] [place] [address] [phone] (office) [phone] (fax)

## 2020-01-26 NOTE — Telephone Encounter (Signed)
Pt aware and voiced understanding, will have labs repeated 8/10 prior to CPX

## 2020-01-26 NOTE — Telephone Encounter (Signed)
-----   Message from Consuelo Pandy, [place] sent at 01/26/2020  2:25 PM EDT ----- K and Mg levels are normal.   She is still fluid overloaded based on Optivol and BNP  Recommend 80 mg of torsemide bid x 3 days, then reduce back to 80 mg qam/60 qpm. Increase KCl to 40 mEq bid x 3 days, then reduce back down to 60 mEq daily. Repeat BMP and BNP on Monday.

## 2020-01-26 NOTE — Patient Instructions (Signed)
No medication changes for now, we will call you with further instructions/medication changes based on your lab results  Your physician recommends that you schedule a follow-up appointment in: 4 weeks  in the Advanced Practitioners (PA/NP) Clinic    Do the following things EVERYDAY: 1) Weigh yourself in the morning before breakfast. Write it down and keep it in a log. 2) Take your medicines as prescribed 3) Eat low salt foods--Limit salt (sodium) to 2000 mg per day.  4) Stay as active as you can everyday 5) Limit all fluids for the day to less than 2 liters  If you have any questions or concerns before your next appointment please send [name] a message through [place] or call our office at [phone].    TO LEAVE A MESSAGE FOR THE NURSE SELECT OPTION 2, PLEASE LEAVE A MESSAGE INCLUDING: . YOUR NAME . DATE OF BIRTH . CALL BACK NUMBER . REASON FOR CALL**this is important as we prioritize the call backs  YOU WILL RECEIVE A CALL BACK THE SAME DAY AS LONG AS YOU CALL BEFORE 4:00 PM

## 2020-01-27 NOTE — Telephone Encounter (Signed)
noteed

## 2020-01-30 ENCOUNTER — Telehealth: Payer: Self-pay | Admitting: *Deleted

## 2020-01-30 ENCOUNTER — Ambulatory Visit (INDEPENDENT_AMBULATORY_CARE_PROVIDER_SITE_OTHER): Payer: BC Managed Care – PPO

## 2020-01-30 DIAGNOSIS — Z95810 Presence of automatic (implantable) cardiac defibrillator: Secondary | ICD-10-CM

## 2020-01-30 DIAGNOSIS — I5022 Chronic systolic (congestive) heart failure: Secondary | ICD-10-CM

## 2020-01-30 MED ORDER — POTASSIUM CHLORIDE CRYS ER 20 MEQ PO TBCR: EXTENDED_RELEASE_TABLET | ORAL | 5 refills | Status: DC

## 2020-01-30 MED ORDER — TORSEMIDE 20 MG PO TABS: ORAL_TABLET | ORAL | 3 refills | Status: DC

## 2020-01-30 NOTE — Telephone Encounter (Signed)
Talked with about [employer] Heart Failure Research Study. Sent patient more information on the study to review. Patient would like to talk with Dr. [name] about the study before making a decision. Will follow up.

## 2020-01-30 NOTE — Progress Notes (Signed)
EPIC Encounter for ICM Monitoring  Patient Name: [name] is a 49 y.o. female Date: 01/30/2020 Primary Care Physican: [name], MD Primary Cardiologist:[place] Electrophysiologist:Klein 01/30/2020 Weight: 239-240 lbs  Spoke with patient and she reports breathing is back to baseline.  She does not feel like she has any fluid symptoms after taking extra Torsemide which ends 01/30/2020.  Pt had device shock 01/24/2020 and has been addressed by physicians and device clinic.  OptivolThoracicimpedancereturned to normal after taking extra Torsemide with extra Potassium x 3 days.  Prescribed:  Torsemide20 mgTake 4 tablets(80 mg total)by mouth every morning and 3 tablets(60 mg total)by mouth every evening.   Potassium 20 mEqTake3tablets (60 mEq total) by mouthdaily  Spironolactone 25 mg take 1 tabletat bedtime.   Metolazone 2.5 mgTake 2.5 mg by mouth as needed (extra fluid as directed by HF clinic).  Labs: 01/26/2020 Creatinine 1.41, BUN 18, Potassium 4.2, Sodium 136, GFR 44-51 12/30/2019 Creatinine 1.22, BUN 18, Potassium 3.5, Sodium 138, GFR 52->60 10/07/2019 Creatinine 0.95, BUN 14, Potassium 4.2, Sodium 139, GFR >60 09/27/2019 Creatinine 0.95, BUN 15, Potassium 3.7, Sodium 138, GFR >60 07/08/2019 Creatinine1.05, BUN20, Potassium3.9, Sodium135, GFR>60 06/28/2019 Creatinine0.99, BUN13, Potassium3.8, Sodium141, GFR>60 A complete set of results can be found in Results Review.  Recommendations:No changes and encouraged to call if experiencing any fluid symptoms.  Follow-up plan: ICM clinic phone appointment on9/12/2019. 91 day device clinic remote transmission9/27/2021.     EP/Cardiology Office Visits:  02/08/2020 with Tommye Standard, PA for treated VT follow up.   02/23/2020 with Dr. [name].  Recall for 07/20/2020 with Dr [name].    Copy of ICM check sent to Dr. [name] and Ellen Henri PA.  3 month ICM trend: 01/30/2020    1 Year ICM  trend:       [name], RN 01/30/2020 12:17 PM

## 2020-01-31 ENCOUNTER — Ambulatory Visit (HOSPITAL_COMMUNITY): Payer: BC Managed Care – PPO

## 2020-01-31 ENCOUNTER — Other Ambulatory Visit (HOSPITAL_COMMUNITY): Payer: Self-pay | Admitting: *Deleted

## 2020-01-31 ENCOUNTER — Ambulatory Visit (HOSPITAL_COMMUNITY)
Admission: RE | Admit: 2020-01-31 | Discharge: 2020-01-31 | Disposition: A | Discharge status: Home / Self Care | Payer: BC Managed Care – PPO | Source: Ambulatory Visit | Attending: Cardiology | Admitting: Cardiology

## 2020-01-31 ENCOUNTER — Other Ambulatory Visit: Payer: Self-pay

## 2020-01-31 DIAGNOSIS — I5022 Chronic systolic (congestive) heart failure: Secondary | ICD-10-CM

## 2020-01-31 LAB — BASIC METABOLIC PANEL
Anion gap: 8 (ref 5–15)
BUN: 15 mg/dL (ref 6–20)
CO2: 26 mmol/L (ref 22–32)
Calcium: 9.3 mg/dL (ref 8.9–10.3)
Chloride: 104 mmol/L (ref 98–111)
Creatinine, Ser: 1.31 mg/dL — ABNORMAL HIGH (ref 0.44–1.00)
GFR calc Af Amer: 56 mL/min — ABNORMAL LOW (ref >60)
GFR calc non Af Amer: 48 mL/min — ABNORMAL LOW (ref >60)
Glucose, Bld: 73 mg/dL (ref 70–99)
Potassium: 3.3 mmol/L — ABNORMAL LOW (ref 3.5–5.1)
Sodium: 138 mmol/L (ref 135–145)

## 2020-02-01 ENCOUNTER — Telehealth (HOSPITAL_COMMUNITY): Payer: Self-pay | Admitting: *Deleted

## 2020-02-01 ENCOUNTER — Encounter (HOSPITAL_COMMUNITY): Payer: Self-pay | Admitting: *Deleted

## 2020-02-01 DIAGNOSIS — I5022 Chronic systolic (congestive) heart failure: Secondary | ICD-10-CM

## 2020-02-01 MED ORDER — POTASSIUM CHLORIDE CRYS ER 20 MEQ PO TBCR: 40.0000 meq | EXTENDED_RELEASE_TABLET | Freq: Two times a day (BID) | ORAL | 6 refills | Status: DC

## 2020-02-01 NOTE — Telephone Encounter (Signed)
-----   Message from Consuelo Pandy, [place] sent at 02/01/2020  2:14 PM EDT ----- K mildly low at 3.3. Increase daily KCl to 40 mEq BID

## 2020-02-01 NOTE — Telephone Encounter (Signed)
Pt aware, agreeable, and verbalized understanding, will repeat labs next week at [facility], order placed

## 2020-02-03 ENCOUNTER — Other Ambulatory Visit (HOSPITAL_COMMUNITY): Payer: Self-pay | Admitting: Cardiology

## 2020-02-04 ENCOUNTER — Ambulatory Visit: Payer: BC Managed Care – PPO

## 2020-02-04 DIAGNOSIS — R0683 Snoring: Secondary | ICD-10-CM

## 2020-02-04 NOTE — Procedures (Signed)
   Sleep Study Report  Patient Information Name: [name] Name: [place]  ID: [id number] Birth Date: [birth year]  Age: 49  Gender: Female Study Date:01/26/2020 Referring Physician :  [name], MD  TEST DESCRIPTION: Home sleep apnea testing was completed using the WatchPat, a Type 1 device, utilizing peripheral arterial tonometry (PAT), chest movement, actigraphy, pulse oximetry, pulse rate, body position and snore.  AHI was calculated with apnea and hypopnea using valid sleep time as the denominator. RDI includes apneas, hypopneas, and RERAs. The data acquired and the scoring of sleep and all associated events were performed in  accordance with the recommended standards and specifications as outlined in the AASM Manual for the Scoring of Sleep and Associated Events 2.2.0 (2015).  FINDINGS: 1. Severe Obstructive Sleep Apnea with AHI 30.3/hr.  2. No Central Sleep Apnea with pAHIc 0/hr. 3. Oxygen desaturations as low as 85%. 4. Mild snoring was present. O2 sats were < 88% for 1.1 min. 5. Total sleep time was 8 hrs and 42 min. 6. 20.6% of total sleep time was spent in REM sleep.  7. Normal sleep onset latency at 26 min.  8. Shortened REM sleep onset latency at 45 min.  9. Total awakenings were 5.   DIAGNOSIS:  Severe Obstructive Sleep Apnea (G47.33)  RECOMMENDATIONS: 1. Clinical correlation of these findings is necessary. The decision to treat obstructive sleep apnea (OSA) is usually based on the presence of apnea symptoms or the presence of associated medical conditions such as Hypertension, Congestive Heart Failure, Atrial Fibrillation or Obesity. The most common symptoms of OSA are snoring, gasping for  breath while sleeping, daytime sleepiness and fatigue.   2. Initiating apnea therapy is recommended given the presence of symptoms and/or associated conditions.  Recommend proceeding with one of the following:   a. Auto-CPAP therapy with a pressure range of 5-20cm H2O.   b. An oral  appliance (OA) that can be obtained from certain dentists with expertise in sleep medicine. These are primarily of use in non-obese patients with mild and moderate disease.   c. An ENT consultation which may be useful to look for specific causes of obstruction and possible treatment options.   d. If patient is intolerant to PAP therapy, consider referral to ENT for evaluation for hypoglossal nerve stimulator.   3. Close follow-up is necessary to ensure success with CPAP or oral appliance therapy for maximum benefit .  4. A follow-up oximetry study on CPAP is recommended to assess the adequacy of therapy and determine the need for supplemental oxygen or the potential need for Bi-level therapy. An arterial blood gas to determine the adequacy of baseline ventilation and oxygenation should also be considered.  5. Healthy sleep recommendations include: adequate nightly sleep (normal 7-9 hrs/night), avoidance of caffeine after noon and alcohol near bedtime, and maintaining a sleep environment that is cool, dark and quiet.  6. Weight loss for overweight patients is recommended. Even modest amounts of weight loss can significantly improve the severity of sleep apnea.  7. Snoring recommendations include: weight loss where appropriate, side sleeping, and avoidance of alcohol before bed.  8. Operation of motor vehicle or dangerous equipment must be avoided when feeling drowsy, excessively sleepy, or mentally fatigued.  Report prepared by:  Signature:  Fransico Him, MD [facility], Diplomate ABSM Electronically Signed: Feb 04, 2020

## 2020-02-07 ENCOUNTER — Telehealth: Payer: Self-pay | Admitting: *Deleted

## 2020-02-07 ENCOUNTER — Ambulatory Visit (INDEPENDENT_AMBULATORY_CARE_PROVIDER_SITE_OTHER): Payer: BC Managed Care – PPO | Admitting: Physician Assistant

## 2020-02-07 NOTE — Telephone Encounter (Addendum)
Informed patient of sleep study results and patient understanding was verbalized. Patient understands her sleep study showed they have significant sleep apnea on her HST and will be set up with PAP unit. Please let DME know that order is in EPIC. Please set patient up for OV in 8 weeks.  Upon patient request DME selection is [facility] Patient understands she will be contacted by [facility] to set up her cpap. Patient understands to call if [facility] does not contact her with new setup in a timely manner. Patient understands they will be called once confirmation has been received from [employer] that they have received their new machine to schedule 10 week follow up appointment.  [facility] notified of new cpap order  Please add to airview Patient was grateful for the call and thanked me.

## 2020-02-07 NOTE — Progress Notes (Addendum)
Cardiology Office Note Date:  02/07/2020  Patient ID:  [name], [name] 01/24/1971, MRN [id number] PCP:  [name], MD  Cardiologist:   Dr. [name] Electrophysiologist: Dr. [name]   Chief Complaint:  recurrent arrhythmia  History of Present Illness: [name] is a 49 y.o. female with history of morbid obesity, HTN, OSA and systolic HF due to nonischemic cardiomyopathy s/p Medtronic ICD (initial implant 2006), PVCs  She was followed for systolic CHF initially in [place]. Apparently her EF recovered to 35-40%. However, she was admitted to [facility] 09/19/13 for worsening dyspnea and CP. Coronaries were reportedly OK on LHC at that time at [facility]. Echo showed EF of 15-20% with four-chamber enlargement with moderate-severe TR/MR. RHC as below. Rheumatological w/u negative.  CPX in 9/16 showed near-normal functional capacity.  Repeat echo in 6/17 shows that EF remains 20%.   She comes in today to be seen for Dr. [name], last seen by him via tele-health visit 09/23/2018, noted "Stable PVC burden but much less noxious appearing than a month ago"  "Reviewing ECG back to 2016 the average burden of ectopy seems about stable, so wonder if the worsening VT might be electromechanical as opposed to causative"  Planned to continue low dose amio.  April 2020 hospitalized for ICD shock in setting of marked hypokalemia (2.2) with recent up-titration in diuretics.  She saw Dr. [name] 06/28/19, her weight was up 23lbs, though walking without DOE, described choinic orthopnea, and DOE with inclines, pt felt weight gain was diet/thyroid related.  Planned to repeat 3 day Zio patch to revisit her PVC burden.   I saw her jan 2021  She is doing well.  She has been walking to get some exercise and feels like she can walk longer, feels better with er walking.  No CP, palpitations or cardiac awareness.  No dizzy spells, near syncope or syncope. No further shocks since last April. PVCs were down some, she had f/u with  Dr. [name] in a few months, given young age, perhaps stop amiodarone at a year mark if no VT.   She has been seeing CHF team regularly, echo in April LVEF 20%, severely dilated, RV was OK.  started on farxiga BPs have been soft, fatigued  01/24/2020 she had VF treated successfully by her device.  She was seen in the HF clinic.  She had recently developed symptoms ov volume OL and OptiVol correlated, her diuretics and K+ adjusted. She was still above dry weight, , EKG was done and OK, QT ok. Labs done, planned to continue amio and coreg. Labs were OK, given still volume OL, planned to continue increased diuretics/K+ with repeat labs planned F/u labs with K+ 3.3 and repleated    TODAY She comes today accompanied by her husband (not driving) She has been ok, no recurrent shocks, syncope. She reports she had just finished dinner, they were settling in to watch some TV she felty suddenly lightheaded.  She did not feel the shock, her husband says she did faint briefly She denies any CP She did not appreciate any marked increase in urine frequency or ammount with the increased diuretics, but does feel less winded, less bloated She chronically sleeps at an incline (he bed is able to incline) though the night before her shock she did have to leave her bed and sleep in the recliner, and thinks she was starting to get volume OL a day or so before  She is back to her baseline diuretic dose, remains at  the higher K+ dose, scheduled for BMET today   Device history:  MDT single chamber ICD, gen change and new lead 12/26/13, replacing 6949 lead, original implant 2006, Dr. [name], primary prevention  Therapy hx April 2020 appropriate HV therapy for PMVT (in setting of marked hypokalemia) 01/24/2020, VF treated in setting of normal lytes  AAD Hx PVCs, NSVT Amiodarone started march/April 2020   Past Medical History:  Diagnosis Date  . Automatic implantable cardioverter-defibrillator in situ   .  Chronic CHF (congestive heart failure) (HCC)    a. EF 15-20% b. RHC (09/2013) RA 14, RV 57/22, PA 64/36 (48), PCWP 18, FIck CO/CI 3.7/1.6, PVR 8.1 WU, PA sat 47%   . History of stomach ulcers   . Hypertension   . Hypotension   . Hypothyroidism   . Morbid obesity ([place])   . Myocardial infarction ([place]) 08/2013  . Nocturnal dyspnea   . Nonischemic cardiomyopathy ([place])   . Sinus tachycardia   . Snoring-prob OSA 09/04/2011  . Sprint Fidelis ICD lead RECALL  [id number]   . UARS (upper airway resistance syndrome) 09/04/2011   HST 12/2013:  AHI 4/hr (numerous episodes of airflow reduction that did not have concomitant desaturation)     Past Surgical History:  Procedure Laterality Date  . BREATH TEK H PYLORI N/A 11/09/2014   Procedure: BREATH TEK H PYLORI;  Surgeon: Greer Pickerel, MD;  Location: [facility];  Service: General;  Laterality: N/A;  . CARDIAC CATHETERIZATION  ~ 2006; 09/2013  . CARDIAC CATHETERIZATION N/A 05/23/2016   Procedure: Right Heart Cath;  Surgeon: Larey Dresser, MD;  Location: [facility];  Service: Cardiovascular;  Laterality: N/A;  . CARDIAC DEFIBRILLATOR PLACEMENT  2006; 12/26/2013   Medtronic [id number]; 12-2013 ICD gen change and RV lead revision with new 4287 RV lead by Dr [name]  . CESAREAN SECTION  1999  . IMPLANTABLE CARDIOVERTER DEFIBRILLATOR GENERATOR CHANGE N/A 12/26/2013   Procedure: IMPLANTABLE CARDIOVERTER DEFIBRILLATOR GENERATOR CHANGE;  Surgeon: Deboraha Sprang, MD;  Location: [facility] CATH LAB;  Service: Cardiovascular;  Laterality: N/A;  . LEAD REVISION N/A 12/26/2013   Procedure: LEAD REVISION;  Surgeon: Deboraha Sprang, MD;  [facility] C[facility];  Service: Cardiovascular;  Laterality: N/A;  . RIGHT HEART CATHETERIZATION N/A 02/15/2014   Procedure: RIGHT HEART CATH;  Surgeon: Larey Dresser, MD;  Location: [facility] CATH LAB;  Service: Cardiovascular;  Laterality: N/A;  . TUBAL LIGATION  1999    Current Outpatient Medications  Medication Sig Dispense Refill  .  amiodarone (PACERONE) 200 MG tablet Take 1 tablet (200 mg total) by mouth daily. 90 tablet 3  . atorvastatin (LIPITOR) 40 MG tablet TAKE 1 TABLET BY MOUTH ONCE A DAY 30 tablet 11  . carvedilol (COREG) 25 MG tablet TAKE 1 TABLET BY MOUTH TWICE A DAY WITH A MEAL 180 tablet 3  . CORLANOR 5 MG TABS tablet TAKE 1 TABLET BY MOUTH TWICE (2) DAILY WITH MEALS 60 tablet 6  . dapagliflozin propanediol (FARXIGA) 10 MG TABS tablet Take 10 mg by mouth daily before breakfast. 30 tablet 5  . digoxin (LANOXIN) 0.125 MG tablet TAKE 1 TABLET BY MOUTH ONCE A DAY 30 tablet 5  . ENTRESTO 49-51 MG TAKE 1 TABLET BY MOUTH TWICE (2) DAILY 60 tablet 11  . levothyroxine (SYNTHROID) 25 MCG tablet Take 1 tablet (25 mcg total) by mouth daily before breakfast. 90 tablet 3  . metolazone (ZAROXOLYN) 2.5 MG tablet Take 2.5 mg by mouth as needed (extra fluid as directed  by HF clinic). (Patient not taking: Reported on 01/26/2020)    . potassium chloride SA (KLOR-CON) 20 MEQ tablet Take 2 tablets (40 mEq total) by mouth 2 (two) times daily. 60 tablet 6  . spironolactone (ALDACTONE) 25 MG tablet TAKE 1 TABLET BY MOUTH EVERY NIGHT AT BEDTIME 90 tablet 3  . torsemide (DEMADEX) 20 MG tablet Take 4 tablets (80 mg total) by mouth 2 (two) times daily for 3 days, THEN 4 tablets (80 mg total) in the morning for 30 days, THEN 3 tablets (60 mg total) every evening. 540 tablet 3  . Vitamin D, Ergocalciferol, (DRISDOL) 1.25 MG (50000 UNIT) CAPS capsule Take 1 capsule (50,000 Units total) by mouth every 7 (seven) days. 4 capsule 0   No current facility-administered medications for this visit.    Allergies:   Patient has no known allergies.   Social History:  The patient  reports that she has never smoked. She has never used smokeless tobacco. She reports that she does not drink alcohol and does not use drugs.   Family History:  The patient's family history includes Heart disease in her maternal grandmother and mother; High blood pressure in her  father; Obesity in her mother; Stroke in her father.  ROS:  Please see the history of present illness.    All other systems are reviewed and otherwise negative.   PHYSICAL EXAM:  VS:  There were no vitals taken for this visit. BMI: There is no height or weight on file to calculate BMI. Well nourished, well developed, in no acute distress  HEENT: normocephalic, atraumatic  Neck: no JVD, carotid bruits or masses Cardiac: RRR, no extrasystoles appreciated,  no significant murmurs, no rubs, or gallops Lungs:  CTA b/l, no wheezing, rhonchi or rales  Abd: soft, nontender, not distended MS: no deformity or atrophy Ext: no edema  Skin: warm and dry, no rash Neuro:  No gross deficits Psych: euthymic mood, full affect  ICD site is stable, no tethering or discomfort   EKG:  Not done today  ICD interrogation done today and reviewed by myself  Battery and lead measurements are good No further arrhythmias OptiVol is back down   09/27/2019: TTE IMPRESSIONS  1. Left ventricular ejection fraction, by estimation, is 20%. The left  ventricle has severely decreased function. The left ventricle demonstrates  global hypokinesis. The left ventricular internal cavity size was severely  dilated. Left ventricular  diastolic parameters are consistent with Grade II diastolic dysfunction  (pseudonormalization).  2. Right ventricular systolic function is normal. The right ventricular  size is normal. The estimated right ventricular systolic pressure is 24.5  mmHg.  3. Left atrial size was mildly dilated.  4. The mitral valve is normal in structure. Mild to moderate mitral valve  regurgitation. No evidence of mitral stenosis.  5. The aortic valve is tricuspid. Aortic valve regurgitation is not  visualized. No aortic stenosis is present.  6. The inferior vena cava is normal in size with greater than 50%  respiratory variability, suggesting right atrial pressure of 3 mmHg.     07/11/19: 3 day  monitor 1. 6.2% PVCs 2. 1 short run VT.    09/03/2018: ZIO: Dr. [name] PVC percentage up to 13.3% with NSVT runs. Would start on amiodarone 200 mg bid x 14 days then 200 mg daily. Will repeat Zio patch in future on amiodarone to make sure suppressed. I am concerned that PVCs could be contributing to her cardiomyopathy. Given young age, would prefer not to have  her on amiodarone long-term. Please refer back over to EP for evaluation of NSVT/PVCs. She has an ICD.  08/19/2018: ZIO Dr. [name] Frequent PVCs, 11% total beats with runs of NSVT. Has ICD. May be playing a role in cardiomyopathy. Would recommend starting amiodarone 200 mg bid x 10 days then 200 mg daily. Will have her repeat Zio patch when amiodarone loaded. Make sure she has followup with EP.  Holter (8/15) with < 1% PVCs  CPX 06/27/2016  Peak VO2: 13.5 (56.2% predicted peak VO2) VE/VCO2 slope: 33 OUES: 2.16 Peak RER: 0.98   RHC 05/23/16: RA mean 5 RV 55/8 PA 56/31, mean 40 PCWP mean 24 Oxygen saturations: PA 61% AO 99% Cardiac Output (Fick) 4.3  Cardiac Index (Fick) 2.0 PVR 3.7 WU  Echo (8/15) with EF 20%, moderate LV dilation, diffuse hypokinesis, normal RV size with mildly decreased systolic function, mild MR.  Echo (5/16) with EF 20%, spherical LV with diffuse hypokinesis, mild MR.  Echo (6/17) with EF 20%, diffuse hypokinesis, moderate LV dilation, normal RV, mild to moderate MR.   Sleep study did not show significant OSA.   RHC (8/15): Mean RA 3 PA 33/11 mean 19 Mean PCWP 8 CI 2.35  Holter (8/15) with < 1% PVCs  CPX 9/16 Peak VO2 16.2 VE/VCO2 slope 23.6 RER 1.25 Normal functional capacity  Recent Labs: 12/30/2019: ALT 12; TSH 4.774 01/26/2020: B Natriuretic Peptide 929.8; Magnesium 2.2 01/31/2020: BUN 15; Creatinine, Ser 1.31; Potassium 3.3; Sodium 138  09/27/2019: Cholesterol 204; HDL 52; LDL Cholesterol 127; Total CHOL/HDL Ratio 3.9; Triglycerides 123; VLDL 25   Estimated Creatinine  Clearance: 63.2 mL/min (A) (by C-G formula based on SCr of 1.31 mg/dL (H)).   Wt Readings from Last 3 Encounters:  01/26/20 247 lb (112 kg)  01/17/20 (!) 240 lb (108.9 kg)  12/30/19 244 lb (110.7 kg)     Other studies reviewed: Additional studies/records reviewed today include: summarized above  ASSESSMENT AND PLAN:  1. DCM     Etiology is unclear, initially noted peri-partum (after son's birth) 2. Chronic CHF      Her weight by our scale is up, reported stable at home, she thinks she is wearing heavier cloths She feels less SOB, less bloated, OptiVol is markedly better   3. ICD     intact function     No programming changes made        4. PVC's 5. VT w/shocks in setting of marked hypokalemia April 2020     PMVT w/ HV therapy  Ag 2021, normal lytes, was volume OL     On amiodarone     LFTs, TSH OK July 2021       Recurrent VT, looked MMVT, burst pacing during charge failed, HV therapy with clean break None further She had not missed any meds Was acutely volume OL at the time, and wonder if this may have been the trigger, her K+ was OK  We discussed no driving 6 months       BP is low, she is asymptomatic, manually rechecked by myself 80/56   Disposition: for now, no changes to her amiodarone.  I will reach out to Dr. [name], given VT, is the corlanor still OK?  Do we think volume OL may have triggered, electronmechanical.  And her BP, low, though asymtomatic.  ADDEND 02/10/2020 Dr. [name] felt there was only smalll risk of AF, do not think VT, with Corlanor so felt was ok to continue, and did not think she needed to  increase amiodarone.    Current medicines are reviewed at length with the patient today.  The patient did not have any concerns regarding medicines.  Haywood Lasso, PA-C 02/07/2020 5:28 AM     [address] [place] [address] [phone] (office)  [phone] (fax)

## 2020-02-07 NOTE — Telephone Encounter (Signed)
-----   Message from Sueanne Margarita, MD sent at 02/04/2020 12:10 PM EDT ----- Please let patient know that they have significant sleep apnea and had successful PAP titration and will be set up with PAP unit.  Please let DME know that order is in EPIC.  Please set patient up for OV in 8 weeks

## 2020-02-08 ENCOUNTER — Other Ambulatory Visit: Payer: Self-pay

## 2020-02-08 ENCOUNTER — Other Ambulatory Visit: Payer: BC Managed Care – PPO | Admitting: *Deleted

## 2020-02-08 ENCOUNTER — Ambulatory Visit (INDEPENDENT_AMBULATORY_CARE_PROVIDER_SITE_OTHER): Payer: BC Managed Care – PPO | Admitting: Physician Assistant

## 2020-02-08 VITALS — BP 79/55 | HR 68 | Ht 63.0 in | Wt 245.0 lb

## 2020-02-08 DIAGNOSIS — Z95810 Presence of automatic (implantable) cardiac defibrillator: Secondary | ICD-10-CM | POA: Diagnosis not present

## 2020-02-08 DIAGNOSIS — I472 Ventricular tachycardia, unspecified: Secondary | ICD-10-CM

## 2020-02-08 DIAGNOSIS — I428 Other cardiomyopathies: Secondary | ICD-10-CM | POA: Diagnosis not present

## 2020-02-08 DIAGNOSIS — I5022 Chronic systolic (congestive) heart failure: Secondary | ICD-10-CM

## 2020-02-08 LAB — BASIC METABOLIC PANEL
BUN/Creatinine Ratio: 14 (ref 9–23)
BUN: 18 mg/dL (ref 6–24)
CO2: 24 mmol/L (ref 20–29)
Calcium: 9.7 mg/dL (ref 8.7–10.2)
Chloride: 102 mmol/L (ref 96–106)
Creatinine, Ser: 1.26 mg/dL — ABNORMAL HIGH (ref 0.57–1.00)
GFR calc Af Amer: 58 mL/min/{1.73_m2} — ABNORMAL LOW (ref >59)
GFR calc non Af Amer: 51 mL/min/{1.73_m2} — ABNORMAL LOW (ref >59)
Glucose: 87 mg/dL (ref 65–99)
Potassium: 4.4 mmol/L (ref 3.5–5.2)
Sodium: 138 mmol/L (ref 134–144)

## 2020-02-08 NOTE — Patient Instructions (Addendum)
Medication Instructions:   Your physician recommends that you continue on your current medications as directed. Please refer to the Current Medication list given to you today.  *If you need a refill on your cardiac medications before your next appointment, please call your pharmacy*   Lab Work: BMET TODAY    If you have labs (blood work) drawn today and your tests are completely normal, you will receive your results only by: Marland Kitchen MyChart Message (if you have MyChart) OR . A paper copy in the mail If you have any lab test that is abnormal or we need to change your treatment, we will call you to review the results.   Testing/Procedures:NONE ORDERED  TODAY   Follow-Up: At [facility], you and your health needs are our priority.  As part of our continuing mission to provide you with exceptional heart care, we have created designated Provider Care Teams.  These Care Teams include your primary Cardiologist (physician) and Advanced Practice Providers (APPs -  Physician Assistants and Nurse Practitioners) who all work together to provide you with the care you need, when you need it.  We recommend signing up for the patient portal called "MyChart".  Sign up information is provided on this After Visit Summary.  MyChart is used to connect with patients for Virtual Visits (Telemedicine).  Patients are able to view lab/test results, encounter notes, upcoming appointments, etc.  Non-urgent messages can be sent to your provider as well.   To learn more about what you can do with MyChart, go to [email or web address].    Your next appointment:   3 month(s)  The format for your next appointment:   In Person  Provider:   You may see Virl Axe, MD or one of the following Advanced Practice Providers on your designated Care Team:    Chanetta Marshall, NP  Tommye Standard, [place]  Legrand Como "[name], [place]    Other Instructions

## 2020-02-23 ENCOUNTER — Ambulatory Visit (HOSPITAL_COMMUNITY)
Admission: RE | Admit: 2020-02-23 | Discharge: 2020-02-23 | Disposition: A | Discharge status: Home / Self Care | Payer: BC Managed Care – PPO | Source: Ambulatory Visit | Attending: Cardiology | Admitting: Cardiology

## 2020-02-23 ENCOUNTER — Other Ambulatory Visit: Payer: Self-pay

## 2020-02-23 ENCOUNTER — Encounter (HOSPITAL_COMMUNITY): Payer: Self-pay

## 2020-02-23 VITALS — BP 92/64 | HR 108 | Ht 63.5 in | Wt 244.0 lb

## 2020-02-23 DIAGNOSIS — I110 Hypertensive heart disease with heart failure: Secondary | ICD-10-CM | POA: Insufficient documentation

## 2020-02-23 DIAGNOSIS — G4733 Obstructive sleep apnea (adult) (pediatric): Secondary | ICD-10-CM | POA: Insufficient documentation

## 2020-02-23 DIAGNOSIS — I428 Other cardiomyopathies: Secondary | ICD-10-CM | POA: Insufficient documentation

## 2020-02-23 DIAGNOSIS — Z8249 Family history of ischemic heart disease and other diseases of the circulatory system: Secondary | ICD-10-CM | POA: Diagnosis not present

## 2020-02-23 DIAGNOSIS — Z79899 Other long term (current) drug therapy: Secondary | ICD-10-CM | POA: Insufficient documentation

## 2020-02-23 DIAGNOSIS — I5022 Chronic systolic (congestive) heart failure: Secondary | ICD-10-CM | POA: Diagnosis not present

## 2020-02-23 DIAGNOSIS — I471 Supraventricular tachycardia: Secondary | ICD-10-CM | POA: Diagnosis not present

## 2020-02-23 DIAGNOSIS — E876 Hypokalemia: Secondary | ICD-10-CM | POA: Diagnosis not present

## 2020-02-23 DIAGNOSIS — E039 Hypothyroidism, unspecified: Secondary | ICD-10-CM | POA: Diagnosis not present

## 2020-02-23 DIAGNOSIS — I272 Pulmonary hypertension, unspecified: Secondary | ICD-10-CM | POA: Insufficient documentation

## 2020-02-23 DIAGNOSIS — Z7984 Long term (current) use of oral hypoglycemic drugs: Secondary | ICD-10-CM | POA: Diagnosis not present

## 2020-02-23 DIAGNOSIS — Z6841 Body Mass Index (BMI) 40.0 and over, adult: Secondary | ICD-10-CM | POA: Diagnosis not present

## 2020-02-23 DIAGNOSIS — Z95810 Presence of automatic (implantable) cardiac defibrillator: Secondary | ICD-10-CM | POA: Diagnosis not present

## 2020-02-23 DIAGNOSIS — Z8349 Family history of other endocrine, nutritional and metabolic diseases: Secondary | ICD-10-CM | POA: Insufficient documentation

## 2020-02-23 DIAGNOSIS — Z79890 Hormone replacement therapy (postmenopausal): Secondary | ICD-10-CM | POA: Diagnosis not present

## 2020-02-23 DIAGNOSIS — E785 Hyperlipidemia, unspecified: Secondary | ICD-10-CM | POA: Insufficient documentation

## 2020-02-23 DIAGNOSIS — I493 Ventricular premature depolarization: Secondary | ICD-10-CM | POA: Diagnosis not present

## 2020-02-23 LAB — DIGOXIN LEVEL: Digoxin Level: 0.8 ng/mL (ref 0.8–2.0)

## 2020-02-23 LAB — BASIC METABOLIC PANEL
Anion gap: 11 (ref 5–15)
BUN: 12 mg/dL (ref 6–20)
CO2: 23 mmol/L (ref 22–32)
Calcium: 9.1 mg/dL (ref 8.9–10.3)
Chloride: 104 mmol/L (ref 98–111)
Creatinine, Ser: 1.17 mg/dL — ABNORMAL HIGH (ref 0.44–1.00)
GFR calc Af Amer: >60 mL/min (ref >60)
GFR calc non Af Amer: 55 mL/min — ABNORMAL LOW (ref >60)
Glucose, Bld: 100 mg/dL — ABNORMAL HIGH (ref 70–99)
Potassium: 3.5 mmol/L (ref 3.5–5.1)
Sodium: 138 mmol/L (ref 135–145)

## 2020-02-23 MED ORDER — METOLAZONE 2.5 MG PO TABS: 2.5000 mg | ORAL_TABLET | ORAL | 3 refills | Status: DC | PRN

## 2020-02-23 NOTE — Patient Instructions (Signed)
It was great to see you today! No medication changes are needed at this time.  Labs today We will only contact you if something comes back abnormal or we need to make some changes. Otherwise no news is good news!  Your physician recommends that you schedule a follow-up appointment in: 3-4 months with Dr [name]  Do the following things EVERYDAY: 1) Weigh yourself in the morning before breakfast. Write it down and keep it in a log. 2) Take your medicines as prescribed 3) Eat low salt foods--Limit salt (sodium) to 2000 mg per day.  4) Stay as active as you can everyday 5) Limit all fluids for the day to less than 2 liters  If you have any questions or concerns before your next appointment please send [name] a message through [facility] or call our office at [phone].    TO LEAVE A MESSAGE FOR THE NURSE SELECT OPTION 2, PLEASE LEAVE A MESSAGE INCLUDING: . YOUR NAME . DATE OF BIRTH . CALL BACK NUMBER . REASON FOR CALL**this is important as we prioritize the call backs  YOU WILL RECEIVE A CALL BACK THE SAME DAY AS LONG AS YOU CALL BEFORE 4:00 PM

## 2020-02-23 NOTE — Progress Notes (Signed)
ID:  [name], DOB 10-28-1970, MRN [id number]    Provider location: [place] [facility] Type of Visit: Established patient   PCP:  [name], MD    Cardiologist:  [name], MD  History of Present Illness: [name] is a 49 y.o. 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  She was followed for systolic CHF initially in [place]. Apparently her EF recovered to 35-40%. However, she was admitted to [facility] 09/19/13 for worsening dyspnea and CP. Coronaries were reportedly OK on LHC at that time at [facility]. Echo showed EF of 15-20% with four-chamber enlargement with moderate-severe TR/MR. RHC as below. Rheumatological w/u negative.  CPX in 9/16 showed near-normal functional capacity.  Repeat echo in 6/17 shows that EF remains 20%. Repeat CPX in 1/18 was submaximal but probably only mild circulatory limitation.  Echo 8/18 showed EF 25-30%, severe LV dilation. CPX in 6/19 showed low normal functional capacity.  Echo in 10/19 showed EF 20-25%, moderate LV dilation.   Zio patch in 2/20 with 11% PVCs and NSVT, amiodarone started. Zio patch in 3/20 with 13.3% PVCs and NSVT.  She saw Dr. [name], he thought that increased PVCs were electromechanical in nature due to worsening heart failure.  Repeat Zio patch on amiodarone in 1/21 showed 6.2% PVCs.   She was admitted in 4/20 with VT in setting of hypokalemia.    CPX in 9/20 showed mild functional limitation, more due to body habitus than heart failure.  Echo in 4/21 showed EF 20% with severe LV dilation, diffuse hypokinesis, normal RV size and systolic function.   Had clinic f/u 12/30/19 and noted increased fatigue but volume status was stable by device interrogation.  CPX 8/21 showed moderate functional limitation, primarily due to obesity and deconditioning. Mild limitation due to HF    In late August, she developed  volume overload and was symptomatic w/ orthopnea/PND.  She sent remote device transmission and Optivol Thoracic impedence correlated w/ fluid overload. Her diuretics were increased and she took a dose of metolazone. On 01/24/20, EP device clinic received [facility] alert after 1 VF event , EGM showed VT >230bpm, no change w/ ATP, 36j shock terminated. There was concern that she had developed hypokalemia from diuretic dose increase leading to her arrhythmia. Labs confirmed hypokalemia.  K 3.3. Meds were adjusted. She had return f/u and K had normalized to 4.4. Volume status improved.   She returns to clinic today for f/u. Wt down 3 lb from previous OV. Euvolemic on exam and by Optivol. No further VT on device interrogation. She reports stable NYHA Class III symptoms c/w baseline. No resting dyspnea. No orthopnea/PND. Reports full med compliance. BP soft but stable, 65H systolic which is c/w baseline. No orthostatic symptoms. She has not been driving. She had recent sleep study that confirmed OSA. She is awaiting CPAP delivery. Dr. [name] following.   Medtronic device interrogation: Impedence/Index c/w euvolemia. No VT/VF  ECG: not performed today   RHC Procedural Findings: RA mean 5 RV 55/8 PA 56/31, mean 40 PCWP mean 24 Oxygen saturations: PA 61% AO 99% Cardiac Output (Fick) 4.3  Cardiac Index (Fick) 2.0 PVR 3.7 WU  RHC 09/24/13  RA 14  RV 57/22  PA 64/36 (48)  PCWP 18  Fick CO/CI: 3.7/1.6  PVR 8.1 WU  Ao sat 95%  PA sat 47%   CPX 06/27/2016  Peak VO2: 13.5 (56.2% predicted peak VO2) VE/VCO2 slope: 33 OUES: 2.16 Peak RER: 0.98  CPX  1/18 Peak VO2 13.5 VE/VCO2 slope 33 RER 0.98 Submaximal study, mild circulatory limitation, suspect more limited by body habitus.   CPX 8/21 Moderate functional limitation, primarily due to obesity and deconditioning. Mild limitation due to HF    Echo (8/15) with EF 20%, moderate LV dilation, diffuse hypokinesis, normal RV size with mildly  decreased systolic function, mild MR.  Echo (5/16) with EF 20%, spherical LV with diffuse hypokinesis, mild MR.  Echo (6/17) with EF 20%, diffuse hypokinesis, moderate LV dilation, normal RV, mild to moderate MR.  Echo (8/18) with EF 25-30%, severe LV dilation, severe LAE.  Echo (2/20) with EF 20-25%, moderate LV dilation, moderate MR.  Echo (4/21) with EF 20-25%, severe LV dilation, mild-moderate MR, normal RV, PASP 26 mmHg.   Sleep study did not show significant OSA.   RHC (8/15): Mean RA 3 PA 33/11 mean 19 Mean PCWP 8 CI 2.35  Holter (8/15) with < 1% PVCs Zio patch (1/21) with 6.2% PVCs  CPX 9/16 Peak VO2 16.2 VE/VCO2 slope 23.6 RER 1.25 Normal functional capacity  CPX (6/19): peak VO2 14.3 (83% predicted), VE/VCO2 slope 30 => low normal functional capacity.   CPX (9/20): peak VO2 14.2, VE/VCO2 slope 29, RER 1.13 => mild functional limitation due to body habitus and heart failure.   Labs: (11/09/13): Dig level 0.5, K 3.6, Creatinine 0.83, pro-BNP 622 (12/19/13): K 3.6, creatinine 0.8 (7/15): K 4.1, creatinine 0.91 (8/15): K 3.9, creatinine 0.91 (10/15): K 4, creatinine 0.93, proBNP 129 (12/15): K 4, creatinine 0.9, digoxin 0.4 (5/16): K 4, creatinine 0.86 (9/16): K 3.6, creatinine 0.86, digoxin < 0.2 (5/17): K 4.1, creatinine 0.95, digoxin 0.3 (5/18): K 3.4, creatinine 0.95, TSH elevated but free T3 and free T4 normal.  (8/18): K 3.9, creatinine 0.87, BNP 108  (11/18): K 3.5, creatinine 1.04, digoxin 0.4 (1/19): LDL 91, HDL 47 (3/19): digoxin 0.7, K 4, creatinine 0.99 (4/19): TSH normal, LDL 95 (9/19): LDL 99, HDL 45, K 4, creatinine 0.96 (10/19): K 3.6, creatinine 0.85, digoxin 0.5 (2/20): LDL 103 (3/20): K 3.9, creatinine 1.05 (4/20): K 3.9, creatinine 1.23 => 1.15, TSH 8, digoxin 0.3, LFTs normal 6/20): K 4.3, creatinine 1.07, LFTs normal, digoxin 0.5 (7/20): K 3.8, creatinine 1.16 (8/20): digoxin < 0.2, LDL 135 (1/21): K 3.9, creatinine 1.05, LDL 171    (4/21): K 4.2, creatinine 0.95, LDL 127, TSH normal, LFTs normal (7/21): K 3.5, creatinine 1.22, TSH 4.7, AST 17, ALT 12  (8/21): K 3.3, creatinine 1.26  Current Outpatient Medications  Medication Sig Dispense Refill  . amiodarone (PACERONE) 200 MG tablet Take 1 tablet (200 mg total) by mouth daily. 90 tablet 3  . atorvastatin (LIPITOR) 40 MG tablet TAKE 1 TABLET BY MOUTH ONCE A DAY 30 tablet 11  . carvedilol (COREG) 25 MG tablet TAKE 1 TABLET BY MOUTH TWICE A DAY WITH A MEAL 180 tablet 3  . CORLANOR 5 MG TABS tablet TAKE 1 TABLET BY MOUTH TWICE (2) DAILY WITH MEALS 60 tablet 6  . dapagliflozin propanediol (FARXIGA) 10 MG TABS tablet Take 10 mg by mouth daily before breakfast. 30 tablet 5  . digoxin (LANOXIN) 0.125 MG tablet TAKE 1 TABLET BY MOUTH ONCE A DAY 30 tablet 5  . ENTRESTO 49-51 MG TAKE 1 TABLET BY MOUTH TWICE (2) DAILY 60 tablet 11  . levothyroxine (SYNTHROID) 25 MCG tablet Take 1 tablet (25 mcg total) by mouth daily  before breakfast. 90 tablet 3  . metolazone (ZAROXOLYN) 2.5 MG tablet Take 2.5 mg by mouth as needed (extra fluid as directed by HF clinic).     . potassium chloride SA (KLOR-CON) 20 MEQ tablet Take 2 tablets (40 mEq total) by mouth 2 (two) times daily. (Patient taking differently: Take 40 mEq by mouth 2 (two) times daily. Taking 2 tab in the morning and 2 tab at night) 60 tablet 6  . spironolactone (ALDACTONE) 25 MG tablet TAKE 1 TABLET BY MOUTH EVERY NIGHT AT BEDTIME 90 tablet 3  . torsemide (DEMADEX) 20 MG tablet Take 4 tablets (80 mg total) by mouth 2 (two) times daily for 3 days, THEN 4 tablets (80 mg total) in the morning for 30 days, THEN 3 tablets (60 mg total) every evening. 540 tablet 3  . Vitamin D, Ergocalciferol, (DRISDOL) 1.25 MG (50000 UNIT) CAPS capsule Take 1 capsule (50,000 Units total) by mouth every 7 (seven) days. 4 capsule 0   No current facility-administered medications for this encounter.    Allergies:   Patient has no known allergies.    Social History:  The patient  reports that she has never smoked. She has never used smokeless tobacco. She reports that she does not drink alcohol and does not use drugs.   Family History:  The patient's family history includes Heart disease in her maternal grandmother and mother; High blood pressure in her father; Obesity in her mother; Stroke in her father.   ROS:  Please see the history of present illness.   All other systems are personally reviewed and negative.   Exam:   Ht 5' 3.5" (1.613 m)   Wt 110.7 kg (244 lb)   BMI 42.54 kg/m  PHYSICAL EXAM: General:  Obese, Well appearing. No respiratory difficulty HEENT: normal Neck: supple. no JVD. Carotids 2+ bilat; no bruits. No lymphadenopathy or thyromegaly appreciated. Cor: PMI nondisplaced. Regular rate & rhythm. No rubs, gallops or murmurs. Lungs: clear Abdomen: obese but soft, nontender, nondistended. No hepatosplenomegaly. No bruits or masses. Good bowel sounds. Extremities: no cyanosis, clubbing, rash, edema Neuro: alert & oriented x 3, cranial nerves grossly intact. moves all 4 extremities w/o difficulty. Affect pleasant.   Recent Labs: 12/30/2019: ALT 12; TSH 4.774 01/26/2020: B Natriuretic Peptide 929.8; Magnesium 2.2 02/08/2020: BUN 18; Creatinine, Ser 1.26; Potassium 4.4; Sodium 138  Personally reviewed   Wt Readings from Last 3 Encounters:  02/23/20 110.7 kg (244 lb)  02/08/20 111.1 kg (245 lb)  01/26/20 112 kg (247 lb)      ASSESSMENT AND PLAN:  1. Chronic Systolic HF: Nonischemic cardiomyopathy.  She has a Medtronic ICD.  Cause for cardiomyopathy is uncertain: initially noted peri-partum (after son's birth), so cannot rule out peri-partum CMP.  On and off, she has had frequent PVCs.   RHC in 8/15 showed normal filling pressures and preserved cardiac output.  She does not qualify for CRT with narrow QRS.  CPX 9/20 with mildly decreased functional capacity due to body habitus and HF.  Echo in 4/21 showed EF 20-25% with  severe LV dilation and mild-moderate MR. CPX 8/21 showed moderate functional limitation, primarily due to obesity and deconditioning. Mild limitation due to HF  - NYHA Class III, stable  - Euvolemic by exam and by Optivol  - Continue torsemide 80 qam/ 40 qpm - Continue Entresto 49/51 bid. BP too soft for titration  - Continue Coreg 25 mg bid  - Continue ivabradine 5 mg bid.   - Continue spironolactone 25  mg daily  - Continue digoxin, 0.125. Check digoxin level today.   - Continue dapagliflozin 10 mg daily. - Check BMP today   - If EF remains low and if she worsens clinically would be candidate for LVAD.  BMI too high currently for transplant.   2. H/o VT/VF: had recent appropriate shock for VT 8/3 that occurred in the setting of hypokalemia and volume overload - no recurrence on device interrogation today. Volume status ok - Check BMP today to ensure K is stable  - Continue amiodarone 200 mg daily  - Continue Coreg 25 mg bid  - Device is followed by Dr. [name]  - no driving x 6 months  3. Pulmonary HTN: Mixed picture with PVR 8.1 on RHC at [facility].  However, repeat study in 8/15 here showed no significant pulmonary hypertension.  - she now has new diagnosis of OSA. Discussed importance of strict compliance w/ CPAP  4. Morbid obesity: Continue to work on weight loss.  [facility], however f/u currently on hold as she is unable to make appts regularly (driving restrictions) and no virtual visits are being offered currently    5. Hyperlipidemia: Continue atorvastatin.  5. PVCs: Worsened with 11% PVCs + NSVT on 2/20 Zio patch.  Amiodarone started, but PVCs up to 13.3% with NSVT on 3/20 Zio patch. She saw Dr. [name] who thought that the ventricular arrhythmias were electromechanical due to worsening HF.  In 4/20, she had VT x 2 with ICD discharges in the setting of hypokalemia. Zio patch in 1/21 with PVCs down to 6.2% of beats. Had VT/VF 8/3 treated  w/ ICD discharge. - Continue amiodarone, (LFTs and TSH WNL 7/21).  She will need a regular eye exam with amiodarone use.  6. Hypothyroidism: She is on levothyroxine now.  - followed by PCP  7. OSA - new diagnosis. Plan to start CPAP. Dr. [name] following    Signed, Lyda Jester, PA-C  02/23/2020  [place] [address] [facility] [place] [address] [phone] (office) [phone] (fax)

## 2020-03-02 ENCOUNTER — Telehealth: Payer: Self-pay

## 2020-03-02 NOTE — Telephone Encounter (Signed)
I called pt about missed ICM appointment. No answer/ no voicemail.

## 2020-03-05 ENCOUNTER — Other Ambulatory Visit (HOSPITAL_COMMUNITY): Payer: Self-pay | Admitting: Cardiology

## 2020-03-05 NOTE — Progress Notes (Signed)
No ICM remote transmission received for 02/28/2020 and next ICM transmission scheduled for 03/20/2020.

## 2020-03-09 DIAGNOSIS — G4733 Obstructive sleep apnea (adult) (pediatric): Secondary | ICD-10-CM | POA: Diagnosis not present

## 2020-03-14 DIAGNOSIS — G4733 Obstructive sleep apnea (adult) (pediatric): Secondary | ICD-10-CM | POA: Diagnosis not present

## 2020-03-19 ENCOUNTER — Ambulatory Visit (INDEPENDENT_AMBULATORY_CARE_PROVIDER_SITE_OTHER): Payer: BC Managed Care – PPO | Admitting: Emergency Medicine

## 2020-03-19 DIAGNOSIS — I428 Other cardiomyopathies: Secondary | ICD-10-CM | POA: Diagnosis not present

## 2020-03-19 LAB — CUP PACEART REMOTE DEVICE CHECK
Battery Remaining Longevity: 42 mo
Battery Voltage: 2.99 V
Brady Statistic RV Percent Paced: 0.01 %
Date Time Interrogation Session: 20210927033322
HighPow Impedance: 60 Ohm
Implantable Lead Implant Date: 20150706
Implantable Lead Location: 753860
Implantable Pulse Generator Implant Date: 20150706
Lead Channel Impedance Value: 399 Ohm
Lead Channel Impedance Value: 494 Ohm
Lead Channel Pacing Threshold Amplitude: 0.5 V
Lead Channel Pacing Threshold Pulse Width: 0.4 ms
Lead Channel Sensing Intrinsic Amplitude: 8.375 mV
Lead Channel Sensing Intrinsic Amplitude: 8.375 mV
Lead Channel Setting Pacing Amplitude: 2 V
Lead Channel Setting Pacing Pulse Width: 0.4 ms
Lead Channel Setting Sensing Sensitivity: 0.3 mV

## 2020-03-20 ENCOUNTER — Telehealth: Payer: Self-pay

## 2020-03-20 ENCOUNTER — Ambulatory Visit (INDEPENDENT_AMBULATORY_CARE_PROVIDER_SITE_OTHER): Payer: BC Managed Care – PPO

## 2020-03-20 DIAGNOSIS — I5022 Chronic systolic (congestive) heart failure: Secondary | ICD-10-CM

## 2020-03-20 DIAGNOSIS — Z95810 Presence of automatic (implantable) cardiac defibrillator: Secondary | ICD-10-CM | POA: Diagnosis not present

## 2020-03-20 NOTE — Progress Notes (Signed)
EPIC Encounter for ICM Monitoring  Patient Name: [name] is a 49 y.o. female Date: 03/20/2020 Primary Care Physican: [name], MD Primary Cardiologist:McLean Electrophysiologist:Klein 01/30/2020 Weight: 239-240 lbs  Attempted call to patient and unable to reach.  Transmission reviewed.   OptivolThoracicimpedance suggesting possible fluid accumulation since 03/09/2020.  Prescribed:  Torsemide20 mgTake 3 tablets(60 mg total)by mouth every evening.   Potassium 20 mEqTake2tablets (40 mEq total) by mouth twice a day  Spironolactone 25 mg take 1 tabletat bedtime.   Metolazone 2.5 mgTake 2.5 mg by mouth as needed (extra fluid as directed by HF clinic).  Labs: 02/23/2020 Creatinine 1.17, BUN 12, Potassium 3.5, Sodium 138, GFR 55->60 02/08/2020 Creatinine 1.26, BUN 18, Potassium 4.4, Sodium 138, GFR 51-58  01/31/2020 Creatinine 1.31, BUN 15, Potassium 3.3, Sodium 138, GFR 48-56  01/26/2020 Creatinine 1.41, BUN 18, Potassium 4.2, Sodium 136, GFR 44-51 12/30/2019 Creatinine 1.22, BUN 18, Potassium 3.5, Sodium 138, GFR 52->60 A complete set of results can be found in Results Review.  Recommendations:Unable to reach.    Follow-up plan: ICM clinic phone appointment on10/10/2019 to recheck fluid levels. 91 day device clinic remote transmissionnot scheduled yet.   EP/Cardiology Office Visits:04/24/2020 with Dr [name].   05/16/2020 with Dr [name].   Copy of ICM check sent to Dr.Klein and Dr [name] for review and recommendations if needed.  3 month ICM trend: 03/19/2020    1 Year ICM trend:       [name], RN 03/20/2020 10:40 AM

## 2020-03-20 NOTE — Telephone Encounter (Signed)
Remote ICM transmission received.  Attempted call to patient regarding ICM remote transmission and no answer.  Unable to leave message on home or cell due mail box is full or not set up.

## 2020-03-21 NOTE — Progress Notes (Signed)
Spoke with patient and advised of Dr Claris Gladden recommendations to take Torsemide 80 mg x 3 days then return to 60 mg daily.  She did not take Torsemide for several days due to traveling.  She thinks now that she has resumed her regular dosage it will resolve.  She is will increase the Torsemide if needed over the next few days.  She denies any fluid symptoms.  Will recheck fluid levels on 03/27/2020.

## 2020-03-21 NOTE — Progress Notes (Signed)
Increase torsemide to 80 mg daily x 3 days then back to 60 mg daily.  

## 2020-03-21 NOTE — Progress Notes (Signed)
Remote ICD transmission.   

## 2020-03-27 ENCOUNTER — Ambulatory Visit (INDEPENDENT_AMBULATORY_CARE_PROVIDER_SITE_OTHER): Payer: BC Managed Care – PPO

## 2020-03-27 DIAGNOSIS — I5022 Chronic systolic (congestive) heart failure: Secondary | ICD-10-CM

## 2020-03-27 DIAGNOSIS — Z95810 Presence of automatic (implantable) cardiac defibrillator: Secondary | ICD-10-CM

## 2020-03-28 NOTE — Progress Notes (Signed)
EPIC Encounter for ICM Monitoring  Patient Name: [name] is a 49 y.o. female Date: 03/28/2020 Primary Care Physican: [name], MD Primary Cardiologist:McLean Electrophysiologist:Klein 03/28/2020 Weight: 242lbs  Spoke with patient.  She declined to take Dr Claris Gladden recommendation to take extra Torsemide and is adjusting diet.  She had missed some Torsemide dosages during vacation but it taking as prescribed now.  She feels the fluid will resolve since she is back on track with diet and Torsemide prescribed dosage.   OptivolThoracicimpedance suggesting possible fluid accumulation since 03/09/2020.  Prescribed:  Torsemide20 mgTake 3tablets(60 mg total)by mouth every evening.   Potassium 20 mEqTake2tablets (40 mEq total) by mouth twice a day  Spironolactone 25 mg take 1 tabletat bedtime.   Metolazone 2.5 mgTake 2.5 mg by mouth as needed (extra fluid as directed by HF clinic).  Labs: 02/23/2020 Creatinine 1.17, BUN 12, Potassium 3.5, Sodium 138, GFR 55->60 02/08/2020 Creatinine 1.26, BUN 18, Potassium 4.4, Sodium 138, GFR 51-58  01/31/2020 Creatinine 1.31, BUN 15, Potassium 3.3, Sodium 138, GFR 48-56  01/26/2020 Creatinine 1.41, BUN 18, Potassium 4.2, Sodium 136, GFR 44-51 12/30/2019 Creatinine 1.22, BUN 18, Potassium 3.5, Sodium 138, GFR 52->60 A complete set of results can be found in Results Review.  Recommendations: Advised to call if she experiences any fluid symptoms or changes.     Follow-up plan: ICM clinic phone appointment on11/06/2019. 91 day device clinic remote transmission12/27/2021.   EP/Cardiology Office Visits:04/24/2020 with Dr [name]. 05/16/2020 with Dr [name].   Copy of ICM check sent to [place]   3 month ICM trend: 03/27/2020    1 Year ICM trend:       [name], RN 03/28/2020 3:52 PM

## 2020-03-30 ENCOUNTER — Telehealth: Payer: Self-pay | Admitting: *Deleted

## 2020-03-30 NOTE — Telephone Encounter (Signed)
Patient has a 10 week follow up appointment scheduled for 05/15/20. Patient understands she needs to keep this appointment for insurance compliance. Patient was grateful for the call and thanked me.

## 2020-03-30 NOTE — Telephone Encounter (Signed)
YOUR CARDIOLOGY TEAM HAS ARRANGED FOR AN E-VISIT FOR YOUR APPOINTMENT - PLEASE REVIEW IMPORTANT INFORMATION BELOW SEVERAL DAYS PRIOR TO YOUR APPOINTMENT  Due to the recent COVID-19 pandemic, we are transitioning in-person office visits to tele-medicine visits in an effort to decrease unnecessary exposure to our patients, their families, and staff. These visits are billed to your insurance just like a normal visit is. We also encourage you to sign up for MyChart if you have not already done so. You will need a smartphone if possible. For patients that do not have this, we can still complete the visit using a regular telephone but do prefer a smartphone to enable video when possible. You may have a family member that lives with you that can help. If possible, we also ask that you have a blood pressure cuff and scale at home to measure your blood pressure, heart rate and weight prior to your scheduled appointment. Patients with clinical needs that need an in-person evaluation and testing will still be able to come to the office if absolutely necessary. If you have any questions, feel free to call our office.     YOUR PROVIDER WILL BE USING THE FOLLOWING PLATFORM TO COMPLETE YOUR VISIT: Staff: Please delete this text and fill in [email or web address]/[email or web address]  . IF USING MYCHART - How to Download the MyChart App to Your SmartPhone   - If Apple, go to App Store and type in MyChart in the search bar and download the app. If Android, ask patient to go to Google Play Store and type in MyChart in the search bar and download the app. The app is free but as with any other app downloads, your phone may require you to verify saved payment information or Apple/Android password.  - You will need to then log into the app with your MyChart username and password, and select [place] as your healthcare provider to link the account.  - When it is time for your visit, go to the MyChart app, find appointments, and click Begin  Video Visit. Be sure to Select Allow for your device to access the Microphone and Camera for your visit. You will then be connected, and your provider will be with you shortly.  **If you have any issues connecting or need assistance, please contact MyChart service desk [phone] ([phone])**  **If using a computer, in order to ensure the best quality for your visit, you will need to use either of the following Internet Browsers: Google Chrome or Microsoft Edge**  . IF USING DOXIMITY or [email or web address].ME - The staff will give you instructions on receiving your link to join the meeting the day of your visit.      THE DAY OF YOUR APPOINTMENT  Approximately 15 minutes prior to your scheduled appointment, you will receive a telephone call from one of [facility] team - your caller ID may say "Unknown caller."  Our staff will confirm medications, vital signs for the day and any symptoms you may be experiencing. Please have this information available prior to the time of visit start. It may also be helpful for you to have a pad of paper and pen handy for any instructions given during your visit. They will also walk you through joining the smartphone meeting if this is a video visit.    CONSENT FOR TELE-HEALTH VISIT - PLEASE REVIEW  I hereby voluntarily request, consent and authorize [facility] and its employed or contracted physicians, physician assistants, nurse practitioners or other licensed health care professionals (the   Practitioner), to provide me with telemedicine health care services (the "Services") as deemed necessary by the treating Practitioner. I acknowledge and consent to receive the Services by the Practitioner via telemedicine. I understand that the telemedicine visit will involve communicating with the Practitioner through live audiovisual communication technology and the disclosure of certain medical information by electronic transmission. I acknowledge that I have been given the  opportunity to request an in-person assessment or other available alternative prior to the telemedicine visit and am voluntarily participating in the telemedicine visit.  I understand that I have the right to withhold or withdraw my consent to the use of telemedicine in the course of my care at any time, without affecting my right to future care or treatment, and that the Practitioner or I may terminate the telemedicine visit at any time. I understand that I have the right to inspect all information obtained and/or recorded in the course of the telemedicine visit and may receive copies of available information for a reasonable fee.  I understand that some of the potential risks of receiving the Services via telemedicine include:  . Delay or interruption in medical evaluation due to technological equipment failure or disruption; . Information transmitted may not be sufficient (e.g. poor resolution of images) to allow for appropriate medical decision making by the Practitioner; and/or  . In rare instances, security protocols could fail, causing a breach of personal health information.  Furthermore, I acknowledge that it is my responsibility to provide information about my medical history, conditions and care that is complete and accurate to the best of my ability. I acknowledge that Practitioner's advice, recommendations, and/or decision may be based on factors not within their control, such as incomplete or inaccurate data provided by me or distortions of diagnostic images or specimens that may result from electronic transmissions. I understand that the practice of medicine is not an exact science and that Practitioner makes no warranties or guarantees regarding treatment outcomes. I acknowledge that I will receive a copy of this consent concurrently upon execution via email to the email address I last provided but may also request a printed copy by calling the office of [facility].    I understand that  my insurance will be billed for this visit.   I have read or had this consent read to me. . I understand the contents of this consent, which adequately explains the benefits and risks of the Services being provided via telemedicine.  . I have been provided ample opportunity to ask questions regarding this consent and the Services and have had my questions answered to my satisfaction. . I give my informed consent for the services to be provided through the use of telemedicine in my medical care  By participating in this telemedicine visit I agree to the above. 

## 2020-04-13 DIAGNOSIS — G4733 Obstructive sleep apnea (adult) (pediatric): Secondary | ICD-10-CM | POA: Diagnosis not present

## 2020-04-17 ENCOUNTER — Emergency Department (HOSPITAL_COMMUNITY): Payer: BC Managed Care – PPO | Admitting: Anesthesiology

## 2020-04-17 ENCOUNTER — Emergency Department (HOSPITAL_COMMUNITY): Payer: BC Managed Care – PPO

## 2020-04-17 ENCOUNTER — Inpatient Hospital Stay (HOSPITAL_COMMUNITY)
Admission: EM | Admit: 2020-04-17 | Discharge: 2020-04-20 | DRG: 023 | Disposition: A | Discharge status: Home / Self Care | Payer: BC Managed Care – PPO | Attending: Neurology | Admitting: Neurology

## 2020-04-17 ENCOUNTER — Inpatient Hospital Stay (HOSPITAL_COMMUNITY): Payer: BC Managed Care – PPO

## 2020-04-17 ENCOUNTER — Encounter (HOSPITAL_COMMUNITY)
Admission: EM | Disposition: A | Discharge status: Home / Self Care | Payer: Self-pay | Source: Home / Self Care | Attending: Neurology

## 2020-04-17 DIAGNOSIS — G8191 Hemiplegia, unspecified affecting right dominant side: Secondary | ICD-10-CM | POA: Diagnosis not present

## 2020-04-17 DIAGNOSIS — I6602 Occlusion and stenosis of left middle cerebral artery: Secondary | ICD-10-CM | POA: Diagnosis not present

## 2020-04-17 DIAGNOSIS — I63512 Cerebral infarction due to unspecified occlusion or stenosis of left middle cerebral artery: Secondary | ICD-10-CM | POA: Diagnosis not present

## 2020-04-17 DIAGNOSIS — I428 Other cardiomyopathies: Secondary | ICD-10-CM | POA: Diagnosis not present

## 2020-04-17 DIAGNOSIS — R4781 Slurred speech: Secondary | ICD-10-CM | POA: Diagnosis not present

## 2020-04-17 DIAGNOSIS — Z823 Family history of stroke: Secondary | ICD-10-CM | POA: Diagnosis not present

## 2020-04-17 DIAGNOSIS — I252 Old myocardial infarction: Secondary | ICD-10-CM | POA: Diagnosis not present

## 2020-04-17 DIAGNOSIS — Z95810 Presence of automatic (implantable) cardiac defibrillator: Secondary | ICD-10-CM | POA: Diagnosis not present

## 2020-04-17 DIAGNOSIS — Z79890 Hormone replacement therapy (postmenopausal): Secondary | ICD-10-CM

## 2020-04-17 DIAGNOSIS — I6389 Other cerebral infarction: Secondary | ICD-10-CM | POA: Diagnosis not present

## 2020-04-17 DIAGNOSIS — I6300 Cerebral infarction due to thrombosis of unspecified precerebral artery: Secondary | ICD-10-CM | POA: Diagnosis not present

## 2020-04-17 DIAGNOSIS — Z8249 Family history of ischemic heart disease and other diseases of the circulatory system: Secondary | ICD-10-CM

## 2020-04-17 DIAGNOSIS — G9389 Other specified disorders of brain: Secondary | ICD-10-CM | POA: Diagnosis not present

## 2020-04-17 DIAGNOSIS — G4733 Obstructive sleep apnea (adult) (pediatric): Secondary | ICD-10-CM | POA: Diagnosis not present

## 2020-04-17 DIAGNOSIS — I5022 Chronic systolic (congestive) heart failure: Secondary | ICD-10-CM | POA: Diagnosis not present

## 2020-04-17 DIAGNOSIS — R29714 NIHSS score 14: Secondary | ICD-10-CM | POA: Diagnosis present

## 2020-04-17 DIAGNOSIS — I69351 Hemiplegia and hemiparesis following cerebral infarction affecting right dominant side: Secondary | ICD-10-CM

## 2020-04-17 DIAGNOSIS — I517 Cardiomegaly: Secondary | ICD-10-CM | POA: Diagnosis not present

## 2020-04-17 DIAGNOSIS — Z6841 Body Mass Index (BMI) 40.0 and over, adult: Secondary | ICD-10-CM

## 2020-04-17 DIAGNOSIS — I493 Ventricular premature depolarization: Secondary | ICD-10-CM | POA: Diagnosis not present

## 2020-04-17 DIAGNOSIS — I340 Nonrheumatic mitral (valve) insufficiency: Secondary | ICD-10-CM

## 2020-04-17 DIAGNOSIS — J341 Cyst and mucocele of nose and nasal sinus: Secondary | ICD-10-CM | POA: Diagnosis not present

## 2020-04-17 DIAGNOSIS — I639 Cerebral infarction, unspecified: Secondary | ICD-10-CM | POA: Diagnosis present

## 2020-04-17 DIAGNOSIS — R0902 Hypoxemia: Secondary | ICD-10-CM | POA: Diagnosis not present

## 2020-04-17 DIAGNOSIS — I272 Pulmonary hypertension, unspecified: Secondary | ICD-10-CM | POA: Diagnosis present

## 2020-04-17 DIAGNOSIS — Z20822 Contact with and (suspected) exposure to covid-19: Secondary | ICD-10-CM | POA: Diagnosis present

## 2020-04-17 DIAGNOSIS — E559 Vitamin D deficiency, unspecified: Secondary | ICD-10-CM | POA: Diagnosis not present

## 2020-04-17 DIAGNOSIS — I63312 Cerebral infarction due to thrombosis of left middle cerebral artery: Secondary | ICD-10-CM | POA: Diagnosis not present

## 2020-04-17 DIAGNOSIS — E039 Hypothyroidism, unspecified: Secondary | ICD-10-CM | POA: Diagnosis not present

## 2020-04-17 DIAGNOSIS — I63412 Cerebral infarction due to embolism of left middle cerebral artery: Principal | ICD-10-CM | POA: Diagnosis present

## 2020-04-17 DIAGNOSIS — R29810 Facial weakness: Secondary | ICD-10-CM | POA: Diagnosis not present

## 2020-04-17 DIAGNOSIS — G936 Cerebral edema: Secondary | ICD-10-CM | POA: Diagnosis present

## 2020-04-17 DIAGNOSIS — Z8673 Personal history of transient ischemic attack (TIA), and cerebral infarction without residual deficits: Secondary | ICD-10-CM | POA: Diagnosis not present

## 2020-04-17 DIAGNOSIS — R404 Transient alteration of awareness: Secondary | ICD-10-CM | POA: Diagnosis not present

## 2020-04-17 DIAGNOSIS — Z79899 Other long term (current) drug therapy: Secondary | ICD-10-CM

## 2020-04-17 DIAGNOSIS — Z9889 Other specified postprocedural states: Secondary | ICD-10-CM | POA: Diagnosis not present

## 2020-04-17 DIAGNOSIS — Z8711 Personal history of peptic ulcer disease: Secondary | ICD-10-CM

## 2020-04-17 DIAGNOSIS — Z95828 Presence of other vascular implants and grafts: Secondary | ICD-10-CM | POA: Diagnosis not present

## 2020-04-17 DIAGNOSIS — R531 Weakness: Secondary | ICD-10-CM | POA: Diagnosis not present

## 2020-04-17 DIAGNOSIS — Z7982 Long term (current) use of aspirin: Secondary | ICD-10-CM | POA: Diagnosis not present

## 2020-04-17 DIAGNOSIS — Z9851 Tubal ligation status: Secondary | ICD-10-CM | POA: Diagnosis not present

## 2020-04-17 DIAGNOSIS — I110 Hypertensive heart disease with heart failure: Secondary | ICD-10-CM | POA: Diagnosis present

## 2020-04-17 DIAGNOSIS — J9690 Respiratory failure, unspecified, unspecified whether with hypoxia or hypercapnia: Secondary | ICD-10-CM

## 2020-04-17 DIAGNOSIS — Z781 Physical restraint status: Secondary | ICD-10-CM

## 2020-04-17 DIAGNOSIS — I5042 Chronic combined systolic (congestive) and diastolic (congestive) heart failure: Secondary | ICD-10-CM | POA: Diagnosis present

## 2020-04-17 DIAGNOSIS — R9389 Abnormal findings on diagnostic imaging of other specified body structures: Secondary | ICD-10-CM

## 2020-04-17 DIAGNOSIS — I63131 Cerebral infarction due to embolism of right carotid artery: Secondary | ICD-10-CM

## 2020-04-17 DIAGNOSIS — E876 Hypokalemia: Secondary | ICD-10-CM | POA: Diagnosis not present

## 2020-04-17 HISTORY — PX: RADIOLOGY WITH ANESTHESIA: SHX6223

## 2020-04-17 HISTORY — PX: IR CT HEAD LTD: IMG2386

## 2020-04-17 HISTORY — PX: IR PERCUTANEOUS ART THROMBECTOMY/INFUSION INTRACRANIAL INC DIAG ANGIO: IMG6087

## 2020-04-17 HISTORY — PX: IR INTRA CRAN STENT: IMG2345

## 2020-04-17 LAB — DIFFERENTIAL
Abs Immature Granulocytes: 0.1 10*3/uL — ABNORMAL HIGH (ref 0.00–0.07)
Basophils Absolute: 0 10*3/uL (ref 0.0–0.1)
Basophils Relative: 0 %
Eosinophils Absolute: 0.1 10*3/uL (ref 0.0–0.5)
Eosinophils Relative: 1 %
Immature Granulocytes: 1 %
Lymphocytes Relative: 19 %
Lymphs Abs: 1.7 10*3/uL (ref 0.7–4.0)
Monocytes Absolute: 0.6 10*3/uL (ref 0.1–1.0)
Monocytes Relative: 7 %
Neutro Abs: 6.6 10*3/uL (ref 1.7–7.7)
Neutrophils Relative %: 72 %

## 2020-04-17 LAB — I-STAT BETA HCG BLOOD, ED (MC, WL, AP ONLY): I-stat hCG, quantitative: <5 m[IU]/mL (ref <5)

## 2020-04-17 LAB — PROTIME-INR
INR: 1.1 (ref 0.8–1.2)
Prothrombin Time: 13.8 seconds (ref 11.4–15.2)

## 2020-04-17 LAB — COMPREHENSIVE METABOLIC PANEL
ALT: 14 U/L (ref 0–44)
AST: 18 U/L (ref 15–41)
Albumin: 3.4 g/dL — ABNORMAL LOW (ref 3.5–5.0)
Alkaline Phosphatase: 54 U/L (ref 38–126)
Anion gap: 10 (ref 5–15)
BUN: 16 mg/dL (ref 6–20)
CO2: 22 mmol/L (ref 22–32)
Calcium: 9 mg/dL (ref 8.9–10.3)
Chloride: 106 mmol/L (ref 98–111)
Creatinine, Ser: 1.28 mg/dL — ABNORMAL HIGH (ref 0.44–1.00)
GFR, Estimated: 51 mL/min — ABNORMAL LOW (ref >60)
Glucose, Bld: 106 mg/dL — ABNORMAL HIGH (ref 70–99)
Potassium: 3.8 mmol/L (ref 3.5–5.1)
Sodium: 138 mmol/L (ref 135–145)
Total Bilirubin: 0.6 mg/dL (ref 0.3–1.2)
Total Protein: 6.9 g/dL (ref 6.5–8.1)

## 2020-04-17 LAB — POCT I-STAT 7, (LYTES, BLD GAS, ICA,H+H)
Acid-base deficit: 3 mmol/L — ABNORMAL HIGH (ref 0.0–2.0)
Bicarbonate: 22.4 mmol/L (ref 20.0–28.0)
Calcium, Ion: 1.15 mmol/L (ref 1.15–1.40)
HCT: 35 % — ABNORMAL LOW (ref 36.0–46.0)
Hemoglobin: 11.9 g/dL — ABNORMAL LOW (ref 12.0–15.0)
O2 Saturation: 99 %
Potassium: 4 mmol/L (ref 3.5–5.1)
Sodium: 141 mmol/L (ref 135–145)
TCO2: 24 mmol/L (ref 22–32)
pCO2 arterial: 41.4 mmHg (ref 32.0–48.0)
pH, Arterial: 7.341 — ABNORMAL LOW (ref 7.350–7.450)
pO2, Arterial: 136 mmHg — ABNORMAL HIGH (ref 83.0–108.0)

## 2020-04-17 LAB — I-STAT CHEM 8, ED
BUN: 17 mg/dL (ref 6–20)
Calcium, Ion: 1.09 mmol/L — ABNORMAL LOW (ref 1.15–1.40)
Chloride: 107 mmol/L (ref 98–111)
Creatinine, Ser: 1.2 mg/dL — ABNORMAL HIGH (ref 0.44–1.00)
Glucose, Bld: 106 mg/dL — ABNORMAL HIGH (ref 70–99)
HCT: 35 % — ABNORMAL LOW (ref 36.0–46.0)
Hemoglobin: 11.9 g/dL — ABNORMAL LOW (ref 12.0–15.0)
Potassium: 3.8 mmol/L (ref 3.5–5.1)
Sodium: 140 mmol/L (ref 135–145)
TCO2: 23 mmol/L (ref 22–32)

## 2020-04-17 LAB — CBC
HCT: 38 % (ref 36.0–46.0)
HCT: 36.6 % (ref 36.0–46.0)
Hemoglobin: 11.2 g/dL — ABNORMAL LOW (ref 12.0–15.0)
Hemoglobin: 11.2 g/dL — ABNORMAL LOW (ref 12.0–15.0)
MCH: 24.2 pg — ABNORMAL LOW (ref 26.0–34.0)
MCH: 24.5 pg — ABNORMAL LOW (ref 26.0–34.0)
MCHC: 29.5 g/dL — ABNORMAL LOW (ref 30.0–36.0)
MCHC: 30.6 g/dL (ref 30.0–36.0)
MCV: 82.1 fL (ref 80.0–100.0)
MCV: 79.9 fL — ABNORMAL LOW (ref 80.0–100.0)
Platelets: 304 10*3/uL (ref 150–400)
Platelets: 421 10*3/uL — ABNORMAL HIGH (ref 150–400)
RBC: 4.63 MIL/uL (ref 3.87–5.11)
RBC: 4.58 MIL/uL (ref 3.87–5.11)
RDW: 16.6 % — ABNORMAL HIGH (ref 11.5–15.5)
RDW: 17 % — ABNORMAL HIGH (ref 11.5–15.5)
WBC: 9.1 10*3/uL (ref 4.0–10.5)
WBC: 14.5 10*3/uL — ABNORMAL HIGH (ref 4.0–10.5)
nRBC: 0 % (ref 0.0–0.2)
nRBC: 0 % (ref 0.0–0.2)

## 2020-04-17 LAB — ECHOCARDIOGRAM COMPLETE
Calc EF: 25.6 %
Height: 63.5 in
MV M vel: 4.03 m/s
MV Peak grad: 65 mmHg
Radius: 0.5 cm
S' Lateral: 6.8 cm
Single Plane A2C EF: 21.7 %
Single Plane A4C EF: 28.6 %
Weight: 4038.83 oz

## 2020-04-17 LAB — HIV ANTIBODY (ROUTINE TESTING W REFLEX): HIV Screen 4th Generation wRfx: NONREACTIVE

## 2020-04-17 LAB — APTT: aPTT: 25 seconds (ref 24–36)

## 2020-04-17 LAB — RESPIRATORY PANEL BY RT PCR (FLU A&B, COVID)
Influenza A by PCR: NEGATIVE
Influenza B by PCR: NEGATIVE
SARS Coronavirus 2 by RT PCR: NEGATIVE

## 2020-04-17 LAB — MRSA PCR SCREENING: MRSA by PCR: NEGATIVE

## 2020-04-17 LAB — CBG MONITORING, ED: Glucose-Capillary: 109 mg/dL — ABNORMAL HIGH (ref 70–99)

## 2020-04-17 LAB — MAGNESIUM: Magnesium: 1.7 mg/dL (ref 1.7–2.4)

## 2020-04-17 LAB — TRIGLYCERIDES: Triglycerides: 97 mg/dL (ref <150)

## 2020-04-17 SURGERY — IR WITH ANESTHESIA
Anesthesia: General

## 2020-04-17 MED ORDER — CANGRELOR BOLUS VIA INFUSION
INTRAVENOUS | Status: AC | PRN
  Administered 2020-04-17: 1717.5 ug via INTRAVENOUS

## 2020-04-17 MED ORDER — PHENYLEPHRINE 40 MCG/ML (10ML) SYRINGE FOR IV PUSH (FOR BLOOD PRESSURE SUPPORT)
PREFILLED_SYRINGE | INTRAVENOUS | Status: DC | PRN
  Administered 2020-04-17: 80 ug via INTRAVENOUS

## 2020-04-17 MED ORDER — LIDOCAINE 2% (20 MG/ML) 5 ML SYRINGE
INTRAMUSCULAR | Status: DC | PRN
  Administered 2020-04-17: 100 mg via INTRAVENOUS

## 2020-04-17 MED ORDER — AMIODARONE HCL 200 MG PO TABS
200.0000 mg | ORAL_TABLET | Freq: Every day | ORAL | Status: DC
  Filled 2020-04-17: qty 1

## 2020-04-17 MED ORDER — PHENYLEPHRINE HCL-NACL 10-0.9 MG/250ML-% IV SOLN
INTRAVENOUS | Status: AC
  Filled 2020-04-17: qty 250

## 2020-04-17 MED ORDER — PROPOFOL 500 MG/50ML IV EMUL
INTRAVENOUS | Status: DC | PRN
  Administered 2020-04-17: 50 ug/kg/min via INTRAVENOUS

## 2020-04-17 MED ORDER — CANGRELOR TETRASODIUM 50 MG IV SOLR
INTRAVENOUS | Status: AC
  Filled 2020-04-17: qty 50

## 2020-04-17 MED ORDER — EPTIFIBATIDE 20 MG/10ML IV SOLN
INTRAVENOUS | Status: AC
  Filled 2020-04-17: qty 10

## 2020-04-17 MED ORDER — NITROGLYCERIN 1 MG/10 ML FOR IR/CATH LAB
INTRA_ARTERIAL | Status: AC | PRN
  Administered 2020-04-17 (×5): 25 ug via INTRA_ARTERIAL

## 2020-04-17 MED ORDER — CEFAZOLIN SODIUM-DEXTROSE 2-4 GM/100ML-% IV SOLN
INTRAVENOUS | Status: AC
  Filled 2020-04-17: qty 100

## 2020-04-17 MED ORDER — NITROGLYCERIN 1 MG/10 ML FOR IR/CATH LAB
INTRA_ARTERIAL | Status: AC
  Filled 2020-04-17: qty 10

## 2020-04-17 MED ORDER — IOHEXOL 300 MG/ML  SOLN
150.0000 mL | Freq: Once | INTRAMUSCULAR | Status: AC | PRN
  Administered 2020-04-17: 75 mL via INTRA_ARTERIAL

## 2020-04-17 MED ORDER — PROPOFOL 1000 MG/100ML IV EMUL
5.0000 ug/kg/min | INTRAVENOUS | Status: DC
  Administered 2020-04-17: 40 ug/kg/min via INTRAVENOUS
  Administered 2020-04-17 (×2): 50 ug/kg/min via INTRAVENOUS
  Administered 2020-04-18 (×2): 30 ug/kg/min via INTRAVENOUS
  Filled 2020-04-17 (×4): qty 100

## 2020-04-17 MED ORDER — DEXAMETHASONE SODIUM PHOSPHATE 10 MG/ML IJ SOLN
INTRAMUSCULAR | Status: DC | PRN
  Administered 2020-04-17: 5 mg via INTRAVENOUS

## 2020-04-17 MED ORDER — IOHEXOL 300 MG/ML  SOLN
50.0000 mL | Freq: Once | INTRAMUSCULAR | Status: AC | PRN
  Administered 2020-04-17: 50 mL via INTRA_ARTERIAL

## 2020-04-17 MED ORDER — ACETAMINOPHEN 160 MG/5ML PO SOLN: 650.0000 mg | ORAL | Status: DC | PRN

## 2020-04-17 MED ORDER — ASPIRIN 325 MG PO TABS: 325.0000 mg | ORAL_TABLET | Freq: Every day | ORAL | Status: DC

## 2020-04-17 MED ORDER — LIDOCAINE HCL (PF) 1 % IJ SOLN
INTRAMUSCULAR | Status: AC | PRN
  Administered 2020-04-17: 10 mL

## 2020-04-17 MED ORDER — SODIUM CHLORIDE 0.9 % IV SOLN
INTRAVENOUS | Status: AC | PRN
  Administered 2020-04-17: 2 ug/kg/min via INTRAVENOUS

## 2020-04-17 MED ORDER — SODIUM CHLORIDE 0.9 % IV SOLN: INTRAVENOUS | Status: DC

## 2020-04-17 MED ORDER — STROKE: EARLY STAGES OF RECOVERY BOOK
Freq: Once | Status: DC
  Filled 2020-04-17: qty 1

## 2020-04-17 MED ORDER — CHLORHEXIDINE GLUCONATE CLOTH 2 % EX PADS
6.0000 | MEDICATED_PAD | Freq: Every day | CUTANEOUS | Status: DC
  Administered 2020-04-17 – 2020-04-20 (×3): 6 via TOPICAL

## 2020-04-17 MED ORDER — CLEVIDIPINE BUTYRATE 0.5 MG/ML IV EMUL: 0.0000 mg/h | INTRAVENOUS | Status: AC

## 2020-04-17 MED ORDER — TICAGRELOR 90 MG PO TABS
90.0000 mg | ORAL_TABLET | Freq: Two times a day (BID) | ORAL | Status: DC
  Administered 2020-04-18 – 2020-04-20 (×4): 90 mg via ORAL
  Filled 2020-04-17 (×6): qty 1

## 2020-04-17 MED ORDER — AMIODARONE HCL 200 MG PO TABS
200.0000 mg | ORAL_TABLET | Freq: Every day | ORAL | Status: DC
  Administered 2020-04-18: 200 mg
  Filled 2020-04-17: qty 1

## 2020-04-17 MED ORDER — FENTANYL CITRATE (PF) 250 MCG/5ML IJ SOLN
INTRAMUSCULAR | Status: DC | PRN
  Administered 2020-04-17 (×2): 50 ug via INTRAVENOUS

## 2020-04-17 MED ORDER — ACETAMINOPHEN 650 MG RE SUPP: 650.0000 mg | RECTAL | Status: DC | PRN

## 2020-04-17 MED ORDER — CHLORHEXIDINE GLUCONATE 0.12% ORAL RINSE (MEDLINE KIT)
15.0000 mL | Freq: Two times a day (BID) | OROMUCOSAL | Status: DC
  Administered 2020-04-17 – 2020-04-18 (×2): 15 mL via OROMUCOSAL

## 2020-04-17 MED ORDER — DAPAGLIFLOZIN PROPANEDIOL 10 MG PO TABS: 10.0000 mg | ORAL_TABLET | Freq: Every day | ORAL | Status: DC

## 2020-04-17 MED ORDER — PHENYLEPHRINE HCL-NACL 10-0.9 MG/250ML-% IV SOLN
INTRAVENOUS | Status: DC | PRN
  Administered 2020-04-17: 30 ug/min via INTRAVENOUS

## 2020-04-17 MED ORDER — ACETAMINOPHEN 325 MG PO TABS: 650.0000 mg | ORAL_TABLET | ORAL | Status: DC | PRN

## 2020-04-17 MED ORDER — SUCCINYLCHOLINE CHLORIDE 200 MG/10ML IV SOSY
PREFILLED_SYRINGE | INTRAVENOUS | Status: DC | PRN
  Administered 2020-04-17: 120 mg via INTRAVENOUS

## 2020-04-17 MED ORDER — ASPIRIN 81 MG PO CHEW
81.0000 mg | CHEWABLE_TABLET | Freq: Every day | ORAL | Status: DC
  Administered 2020-04-19 – 2020-04-20 (×2): 81 mg via ORAL
  Filled 2020-04-17 (×3): qty 1

## 2020-04-17 MED ORDER — VERAPAMIL HCL 2.5 MG/ML IV SOLN
INTRAVENOUS | Status: AC
  Filled 2020-04-17: qty 2

## 2020-04-17 MED ORDER — PROPOFOL 1000 MG/100ML IV EMUL
INTRAVENOUS | Status: AC
  Filled 2020-04-17: qty 100

## 2020-04-17 MED ORDER — ASPIRIN 300 MG RE SUPP: 300.0000 mg | Freq: Every day | RECTAL | Status: DC

## 2020-04-17 MED ORDER — IOHEXOL 300 MG/ML  SOLN
150.0000 mL | Freq: Once | INTRAMUSCULAR | Status: AC | PRN
  Administered 2020-04-17: 50 mL via INTRA_ARTERIAL

## 2020-04-17 MED ORDER — CEFAZOLIN SODIUM-DEXTROSE 2-3 GM-%(50ML) IV SOLR
INTRAVENOUS | Status: DC | PRN
  Administered 2020-04-17: 2 g via INTRAVENOUS

## 2020-04-17 MED ORDER — SODIUM CHLORIDE 0.9 % IV SOLN: INTRAVENOUS | Status: DC | PRN

## 2020-04-17 MED ORDER — ASPIRIN 81 MG PO CHEW
CHEWABLE_TABLET | ORAL | Status: AC
  Filled 2020-04-17: qty 1

## 2020-04-17 MED ORDER — CLOPIDOGREL BISULFATE 300 MG PO TABS
ORAL_TABLET | ORAL | Status: AC
  Filled 2020-04-17: qty 1

## 2020-04-17 MED ORDER — DIGOXIN 125 MCG PO TABS
0.1250 mg | ORAL_TABLET | Freq: Every day | ORAL | Status: DC
  Filled 2020-04-17 (×2): qty 1

## 2020-04-17 MED ORDER — PHENYLEPHRINE HCL-NACL 10-0.9 MG/250ML-% IV SOLN
25.0000 ug/min | INTRAVENOUS | Status: DC
  Administered 2020-04-17: 90 ug/min via INTRAVENOUS
  Administered 2020-04-17: 110 ug/min via INTRAVENOUS
  Administered 2020-04-17: 80 ug/min via INTRAVENOUS
  Administered 2020-04-18: 100 ug/min via INTRAVENOUS
  Administered 2020-04-18 (×2): 70 ug/min via INTRAVENOUS
  Filled 2020-04-17: qty 500
  Filled 2020-04-17 (×2): qty 250
  Filled 2020-04-17 (×2): qty 500

## 2020-04-17 MED ORDER — TICAGRELOR 90 MG PO TABS
90.0000 mg | ORAL_TABLET | Freq: Two times a day (BID) | ORAL | Status: DC
  Administered 2020-04-17 – 2020-04-18 (×2): 90 mg
  Filled 2020-04-17 (×2): qty 1

## 2020-04-17 MED ORDER — ASPIRIN 81 MG PO CHEW
81.0000 mg | CHEWABLE_TABLET | Freq: Every day | ORAL | Status: DC
  Administered 2020-04-18: 81 mg
  Filled 2020-04-17: qty 1

## 2020-04-17 MED ORDER — SODIUM CHLORIDE 0.9 % IV SOLN: 250.0000 mL | INTRAVENOUS | Status: DC

## 2020-04-17 MED ORDER — ONDANSETRON HCL 4 MG/2ML IJ SOLN
INTRAMUSCULAR | Status: DC | PRN
  Administered 2020-04-17: 4 mg via INTRAVENOUS

## 2020-04-17 MED ORDER — ENOXAPARIN SODIUM 40 MG/0.4ML ~~LOC~~ SOLN
40.0000 mg | SUBCUTANEOUS | Status: DC
  Administered 2020-04-18 – 2020-04-20 (×3): 40 mg via SUBCUTANEOUS
  Filled 2020-04-17 (×3): qty 0.4

## 2020-04-17 MED ORDER — PROPOFOL 10 MG/ML IV BOLUS
INTRAVENOUS | Status: DC | PRN
  Administered 2020-04-17: 30 mg via INTRAVENOUS
  Administered 2020-04-17: 20 mg via INTRAVENOUS
  Administered 2020-04-17: 70 mg via INTRAVENOUS
  Administered 2020-04-17: 30 mg via INTRAVENOUS

## 2020-04-17 MED ORDER — EPHEDRINE SULFATE-NACL 50-0.9 MG/10ML-% IV SOSY
PREFILLED_SYRINGE | INTRAVENOUS | Status: DC | PRN
  Administered 2020-04-17 (×4): 5 mg via INTRAVENOUS

## 2020-04-17 MED ORDER — ATORVASTATIN CALCIUM 10 MG PO TABS
40.0000 mg | ORAL_TABLET | Freq: Every day | ORAL | Status: DC
  Filled 2020-04-17 (×2): qty 4

## 2020-04-17 MED ORDER — TICAGRELOR 60 MG PO TABS
ORAL_TABLET | ORAL | Status: AC | PRN
  Administered 2020-04-17: 180 mg

## 2020-04-17 MED ORDER — IOHEXOL 350 MG/ML SOLN
100.0000 mL | Freq: Once | INTRAVENOUS | Status: AC | PRN
  Administered 2020-04-17: 100 mL via INTRAVENOUS

## 2020-04-17 MED ORDER — SODIUM CHLORIDE 0.9% FLUSH
3.0000 mL | Freq: Once | INTRAVENOUS | Status: DC
Start: 2020-04-17 — End: 2020-04-21

## 2020-04-17 MED ORDER — DIGOXIN 125 MCG PO TABS
0.1250 mg | ORAL_TABLET | Freq: Every day | ORAL | Status: DC
  Administered 2020-04-17 – 2020-04-18 (×2): 0.125 mg
  Filled 2020-04-17 (×2): qty 1

## 2020-04-17 MED ORDER — TIROFIBAN HCL IN NACL 5-0.9 MG/100ML-% IV SOLN
INTRAVENOUS | Status: AC
  Filled 2020-04-17: qty 100

## 2020-04-17 MED ORDER — ROCURONIUM BROMIDE 10 MG/ML (PF) SYRINGE
PREFILLED_SYRINGE | INTRAVENOUS | Status: DC | PRN
  Administered 2020-04-17: 30 mg via INTRAVENOUS
  Administered 2020-04-17: 10 mg via INTRAVENOUS
  Administered 2020-04-17: 50 mg via INTRAVENOUS
  Administered 2020-04-17: 30 mg via INTRAVENOUS
  Administered 2020-04-17: 10 mg via INTRAVENOUS

## 2020-04-17 MED ORDER — LEVOTHYROXINE SODIUM 25 MCG PO TABS
25.0000 ug | ORAL_TABLET | Freq: Every day | ORAL | Status: DC
  Filled 2020-04-17: qty 1

## 2020-04-17 MED ORDER — TICAGRELOR 90 MG PO TABS
ORAL_TABLET | ORAL | Status: AC
  Filled 2020-04-17: qty 2

## 2020-04-17 MED ORDER — ORAL CARE MOUTH RINSE
15.0000 mL | OROMUCOSAL | Status: DC
  Administered 2020-04-17 – 2020-04-18 (×7): 15 mL via OROMUCOSAL

## 2020-04-17 MED ORDER — ASPIRIN 81 MG PO CHEW
CHEWABLE_TABLET | ORAL | Status: AC | PRN
  Administered 2020-04-17: 81 mg

## 2020-04-17 NOTE — Anesthesia Procedure Notes (Addendum)
Procedure Name: Intubation Date/Time: 04/17/2020 10:34 AM Performed by: Rande Brunt, CRNA Pre-anesthesia Checklist: Patient identified, Emergency Drugs available, Suction available and Patient being monitored Patient Re-evaluated:Patient Re-evaluated prior to induction Oxygen Delivery Method: Ambu bag Preoxygenation: Pre-oxygenation with 100% oxygen Induction Type: IV induction and Rapid sequence Tube type: Oral Tube size: 7.5 mm Number of attempts: 1 Airway Equipment and Method: Stylet Placement Confirmation: ETT inserted through vocal cords under direct vision,  positive ETCO2 and breath sounds checked- equal and bilateral Secured at: 22 cm Tube secured with: Tape Dental Injury: Teeth and Oropharynx as per pre-operative assessment

## 2020-04-17 NOTE — Progress Notes (Signed)
RT assisted RN with transporting pt from 4N32 to CT. Pt tolerated well with SVS.

## 2020-04-17 NOTE — ED Provider Notes (Signed)
[facility] EMERGENCY DEPARTMENT Provider Note   CSN: [id number] Arrival date & time: 04/17/20  5397  An emergency department physician performed an initial assessment on this suspected stroke patient at 0947.  History Chief Complaint  Patient presents with  . Code Stroke    [name] is a 49 y.o. female.  Presents to ER as code stroke  History limited due to acuity, confusion.  Last known well last night patient went to bed at approximately 10 PM.  Patient woke up not feeling right, having some right-sided symptoms, progressed during the morning, now with significant right-sided weakness and confusion, difficulty speaking.  HPI     Past Medical History:  Diagnosis Date  . Automatic implantable cardioverter-defibrillator in situ   . Chronic CHF (congestive heart failure) (HCC)    a. EF 15-20% b. RHC (09/2013) RA 14, RV 57/22, PA 64/36 (48), PCWP 18, FIck CO/CI 3.7/1.6, PVR 8.1 WU, PA sat 47%   . History of stomach ulcers   . Hypertension   . Hypotension   . Hypothyroidism   . Morbid obesity ([place])   . Myocardial infarction ([place]) 08/2013  . Nocturnal dyspnea   . Nonischemic cardiomyopathy ([place])   . Sinus tachycardia   . Snoring-prob OSA 09/04/2011  . Sprint Fidelis ICD lead RECALL  [id number]   . UARS (upper airway resistance syndrome) 09/04/2011   HST 12/2013:  AHI 4/hr (numerous episodes of airflow reduction that did not have concomitant desaturation)     Patient Active Problem List   Diagnosis Date Noted  . 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 ICD (implantable cardioverter-defibrillator) discharge 09/27/2018  . Vitamin D deficiency 10/19/2017  . Insulin resistance 10/19/2017  . Hypotension 06/03/2016  . Volume depletion 06/03/2016  . Preoperative cardiovascular examination 10/25/2014  . Pulmonary hypertension ([place]) 02/07/2014  . Sprint Fidelis ICD lead RECALL  [id number]   . UARS (upper airway resistance syndrome) 09/04/2011  . Encounter for fitting or  adjustment of implantable cardioverter-defibrillator (ICD)   . Obesity 03/12/2009  . CARDIOMYOPATHY, PRIMARY, DILATED 03/12/2009  . SYSTOLIC HEART FAILURE, CHRONIC 03/12/2009    Past Surgical History:  Procedure Laterality Date  . BREATH TEK H PYLORI N/A 11/09/2014   Procedure: BREATH TEK H PYLORI;  Surgeon: Greer Pickerel, MD;  Location: [facility];  Service: General;  Laterality: N/A;  . CARDIAC CATHETERIZATION  ~ 2006; 09/2013  . CARDIAC CATHETERIZATION N/A 05/23/2016   Procedure: Right Heart Cath;  Surgeon: Larey Dresser, MD;  Location: [facility];  Service: Cardiovascular;  Laterality: N/A;  . CARDIAC DEFIBRILLATOR PLACEMENT  2006; 12/26/2013   Medtronic [id number]; 12-2013 ICD gen change and RV lead revision with new 6734 RV lead by Dr [name]  . CESAREAN SECTION  1999  . IMPLANTABLE CARDIOVERTER DEFIBRILLATOR GENERATOR CHANGE N/A 12/26/2013   Procedure: IMPLANTABLE CARDIOVERTER DEFIBRILLATOR GENERATOR CHANGE;  Surgeon: Deboraha Sprang, MD;  Location: [facility] CATH LAB;  Service: Cardiovascular;  Laterality: N/A;  . LEAD REVISION N/A 12/26/2013   Procedure: LEAD REVISION;  Surgeon: Deboraha Sprang, MD;  Location: [facility] C[facility] LAB;  Service: Cardiovascular;  Laterality: N/A;  . RIGHT HEART CATHETERIZATION N/A 02/15/2014   Procedure: RIGHT HEART CATH;  Surgeon: Larey Dresser, MD;  Location: [facility] CATH LAB;  Service: Cardiovascular;  Laterality: N/A;  . TUBAL LIGATION  1999     OB History    Gravida  2   Para  2   Term      Preterm  AB      Living  2     SAB      TAB      Ectopic      Multiple      Live Births              Family History  Problem Relation Age of Onset  . Heart disease Maternal Grandmother   . Heart disease Mother   . Obesity Mother   . High blood pressure Father   . Stroke Father     Social History   Tobacco Use  . Smoking status: Never Smoker  . Smokeless tobacco: Never Used  Vaping Use  . Vaping Use: Never used  Substance Use Topics   . Alcohol use: No  . Drug use: No    Home Medications Prior to Admission medications   Medication Sig Start Date End Date Taking? Authorizing Provider  amiodarone (PACERONE) 200 MG tablet Take 1 tablet (200 mg total) by mouth daily. 03/15/19  Yes Larey Dresser, MD  atorvastatin (LIPITOR) 40 MG tablet TAKE 1 TABLET BY MOUTH ONCE A DAY Patient taking differently: Take 40 mg by mouth daily.  02/06/20  Yes Larey Dresser, MD  carvedilol (COREG) 25 MG tablet TAKE 1 TABLET BY MOUTH TWICE A DAY WITH A MEAL Patient taking differently: Take 25 mg by mouth 2 (two) times daily with a meal.  03/15/19  Yes Larey Dresser, MD  CORLANOR 5 MG TABS tablet TAKE 1 TABLET BY MOUTH TWICE (2) DAILY WITH MEALS Patient taking differently: Take 5 mg by mouth 2 (two) times daily with a meal.  06/14/19  Yes Larey Dresser, MD  dapagliflozin propanediol (FARXIGA) 10 MG TABS tablet Take 10 mg by mouth daily before breakfast. 09/27/19  Yes Larey Dresser, MD  digoxin (LANOXIN) 0.125 MG tablet TAKE 1 TABLET BY MOUTH ONCE A DAY Patient taking differently: Take 0.125 mg by mouth daily.  03/05/20  Yes Larey Dresser, MD  ENTRESTO 49-51 MG TAKE 1 TABLET BY MOUTH TWICE (2) DAILY Patient taking differently: Take 1 tablet by mouth 2 (two) times daily.  02/06/20  Yes Larey Dresser, MD  spironolactone (ALDACTONE) 25 MG tablet TAKE 1 TABLET BY MOUTH EVERY NIGHT AT BEDTIME Patient taking differently: Take 25 mg by mouth at bedtime.  10/06/19  Yes Larey Dresser, MD  torsemide (DEMADEX) 20 MG tablet Take 4 tablets (80 mg total) by mouth 2 (two) times daily for 3 days, THEN 4 tablets (80 mg total) in the morning for 30 days, THEN 3 tablets (60 mg total) every evening. Patient taking differently: Take 4 tablets (80mg ) in the morning and 2 tablets (40mg ) in the evening. 01/30/20 04/17/20 Yes Simmons, Brittainy M, PA-C  levothyroxine (SYNTHROID) 25 MCG tablet Take 1 tablet (25 mcg total) by mouth daily before breakfast. 09/23/18    Deboraha Sprang, MD  metolazone (ZAROXOLYN) 2.5 MG tablet Take 1 tablet (2.5 mg total) by mouth as needed (extra fluid as directed by HF clinic). 02/23/20   Lyda Jester M, PA-C  potassium chloride SA (KLOR-CON) 20 MEQ tablet Take 2 tablets (40 mEq total) by mouth 2 (two) times daily. Patient taking differently: Take 40 mEq by mouth 2 (two) times daily. Taking 2 tab in the morning and 2 tab at night 02/01/20   Lyda Jester M, PA-C  Vitamin D, Ergocalciferol, (DRISDOL) 1.25 MG (50000 UNIT) CAPS capsule Take 1 capsule (50,000 Units total) by mouth every 7 (seven) days. 01/17/20  Abby Potash, PA-C    Allergies    Patient has no known allergies.  Review of Systems   Review of Systems  Unable to perform ROS: Mental status change    Physical Exam Updated Vital Signs BP 93/75   Pulse 89   Resp 17   Ht 5' 3.5" (1.613 m)   Wt 114.5 kg   SpO2 100%   BMI 44.01 kg/m   Physical Exam Vitals and nursing note reviewed.  Constitutional:      General: She is not in acute distress.    Appearance: She is well-developed.  HENT:     Head: Normocephalic and atraumatic.  Eyes:     Conjunctiva/sclera: Conjunctivae normal.  Cardiovascular:     Rate and Rhythm: Normal rate.     Heart sounds: No murmur heard.   Pulmonary:     Effort: Pulmonary effort is normal. No respiratory distress.  Musculoskeletal:        General: No deformity or signs of injury.     Cervical back: Neck supple.  Skin:    General: Skin is warm and dry.  Neurological:     Mental Status: She is alert.     Comments: Left gaze preference , right facial droop, right arm weakness, right leg weakness, right decreased sensation, dysarthria        ED Results / Procedures / Treatments   Labs (all labs ordered are listed, but only abnormal results are displayed) Labs Reviewed  CBC - Abnormal; Notable for the following components:      Result Value   Hemoglobin 11.2 (*)    MCH 24.2 (*)    MCHC 29.5 (*)    RDW  16.6 (*)    All other components within normal limits  DIFFERENTIAL - Abnormal; Notable for the following components:   Abs Immature Granulocytes 0.10 (*)    All other components within normal limits  COMPREHENSIVE METABOLIC PANEL - Abnormal; Notable for the following components:   Glucose, Bld 106 (*)    Creatinine, Ser 1.28 (*)    Albumin 3.4 (*)    GFR, Estimated 51 (*)    All other components within normal limits  I-STAT CHEM 8, ED - Abnormal; Notable for the following components:   Creatinine, Ser 1.20 (*)    Glucose, Bld 106 (*)    Calcium, Ion 1.09 (*)    Hemoglobin 11.9 (*)    HCT 35.0 (*)    All other components within normal limits  CBG MONITORING, ED - Abnormal; Notable for the following components:   Glucose-Capillary 109 (*)    All other components within normal limits  RESPIRATORY PANEL BY RT PCR (FLU A&B, COVID)  PROTIME-INR  APTT  HIV ANTIBODY (ROUTINE TESTING W REFLEX)  CBC  I-STAT BETA HCG BLOOD, ED ([facility], [facility], [facility] ONLY)    EKG None  Radiology CT Code Stroke CTA Head W/WO contrast  Result Date: 04/17/2020 CLINICAL DATA:  Right-sided weakness. EXAM: CT ANGIOGRAPHY HEAD AND NECK CT PERFUSION BRAIN TECHNIQUE: Multidetector CT imaging of the head and neck was performed using the standard protocol during bolus administration of intravenous contrast. Multiplanar CT image reconstructions and MIPs were obtained to evaluate the vascular anatomy. Carotid stenosis measurements (when applicable) are obtained utilizing NASCET criteria, using the distal internal carotid diameter as the denominator. Multiphase CT imaging of the brain was performed following IV bolus contrast injection. Subsequent parametric perfusion maps were calculated using RAPID software. CONTRAST:  17mL OMNIPAQUE IOHEXOL 350 MG/ML SOLN COMPARISON:  CT  head 04/17/2020 FINDINGS: CTA NECK FINDINGS Aortic arch: On the initial scan, there is no arterial enhancement due to early trigger of the machine. Patient was  rescanned immediately after the initial scan with satisfactory opacification of the intracranial arterial system. The neck was not re-scanned to evaluate the carotid and vertebral arteries in the neck. Right carotid system: Not evaluated due to lack of arterial contrast. Left carotid system: Not evaluated due to lack of arterial contrast. Vertebral arteries: Not evaluated. Skeleton: No acute abnormality. Other neck: Question left thyroidectomy. Right thyroid mildly enlarged without focal abnormality. No adenopathy in the neck. Upper chest: Mild hilar adenopathy bilaterally.  Lung apices clear. Review of the MIP images confirms the above findings CTA HEAD FINDINGS Anterior circulation: Internal carotid artery widely patent through the cavernous segment bilaterally. Occlusion left M1 segment proximally. There is collateral circulation with good opacification of left M2 and M3 branches. Both anterior cerebral arteries are patent without stenosis. Right middle cerebral artery widely patent without stenosis. Posterior circulation: Both vertebral arteries patent to the basilar. PICA patent bilaterally. Basilar widely patent. Superior cerebellar and posterior cerebral arteries patent bilaterally without stenosis or large vessel occlusion. Venous sinuses: Normal venous enhancement. Anatomic variants: None Review of the MIP images confirms the above findings CT Brain Perfusion Findings: ASPECTS: 9 CBF (<30%) Volume: 0ML Perfusion (Tmax>6.0s) volume: 55mL Mismatch Volume: 48mL Infarction Location:Left MCA territory involving the frontal and parietal lobe. IMPRESSION: 1. CT perfusion positive for 64 mL of delayed perfusion in the left MCA territory. No fixed infarct on perfusion. 2. Occlusion proximal left M1 segment with good distal collateral flow to the left MCA branches. 3. No other intracranial stenosis 4. Carotid vertebral arteries in the neck were not evaluated due to lack of arterial opacification. The scan was  performed early in the pulmonary arterial phase and subsequently immediately repeated with adequate intracranial arterial enhancement. 5. Images were reviewed with Dr. Leonel Ramsay following completion of the study Electronically Signed   By: Franchot Gallo M.D.   On: 04/17/2020 10:28   CT Code Stroke CTA Neck W/WO contrast  Result Date: 04/17/2020 CLINICAL DATA:  Right-sided weakness. EXAM: CT ANGIOGRAPHY HEAD AND NECK CT PERFUSION BRAIN TECHNIQUE: Multidetector CT imaging of the head and neck was performed using the standard protocol during bolus administration of intravenous contrast. Multiplanar CT image reconstructions and MIPs were obtained to evaluate the vascular anatomy. Carotid stenosis measurements (when applicable) are obtained utilizing NASCET criteria, using the distal internal carotid diameter as the denominator. Multiphase CT imaging of the brain was performed following IV bolus contrast injection. Subsequent parametric perfusion maps were calculated using RAPID software. CONTRAST:  171mL OMNIPAQUE IOHEXOL 350 MG/ML SOLN COMPARISON:  CT head 04/17/2020 FINDINGS: CTA NECK FINDINGS Aortic arch: On the initial scan, there is no arterial enhancement due to early trigger of the machine. Patient was rescanned immediately after the initial scan with satisfactory opacification of the intracranial arterial system. The neck was not re-scanned to evaluate the carotid and vertebral arteries in the neck. Right carotid system: Not evaluated due to lack of arterial contrast. Left carotid system: Not evaluated due to lack of arterial contrast. Vertebral arteries: Not evaluated. Skeleton: No acute abnormality. Other neck: Question left thyroidectomy. Right thyroid mildly enlarged without focal abnormality. No adenopathy in the neck. Upper chest: Mild hilar adenopathy bilaterally.  Lung apices clear. Review of the MIP images confirms the above findings CTA HEAD FINDINGS Anterior circulation: Internal carotid  artery widely patent through the cavernous segment bilaterally. Occlusion left  M1 segment proximally. There is collateral circulation with good opacification of left M2 and M3 branches. Both anterior cerebral arteries are patent without stenosis. Right middle cerebral artery widely patent without stenosis. Posterior circulation: Both vertebral arteries patent to the basilar. PICA patent bilaterally. Basilar widely patent. Superior cerebellar and posterior cerebral arteries patent bilaterally without stenosis or large vessel occlusion. Venous sinuses: Normal venous enhancement. Anatomic variants: None Review of the MIP images confirms the above findings CT Brain Perfusion Findings: ASPECTS: 9 CBF (<30%) Volume: 0ML Perfusion (Tmax>6.0s) volume: 36mL Mismatch Volume: 18mL Infarction Location:Left MCA territory involving the frontal and parietal lobe. IMPRESSION: 1. CT perfusion positive for 64 mL of delayed perfusion in the left MCA territory. No fixed infarct on perfusion. 2. Occlusion proximal left M1 segment with good distal collateral flow to the left MCA branches. 3. No other intracranial stenosis 4. Carotid vertebral arteries in the neck were not evaluated due to lack of arterial opacification. The scan was performed early in the pulmonary arterial phase and subsequently immediately repeated with adequate intracranial arterial enhancement. 5. Images were reviewed with Dr. Leonel Ramsay following completion of the study Electronically Signed   By: Franchot Gallo M.D.   On: 04/17/2020 10:28   CT Code Stroke Cerebral Perfusion with contrast  Result Date: 04/17/2020 CLINICAL DATA:  Right-sided weakness. EXAM: CT ANGIOGRAPHY HEAD AND NECK CT PERFUSION BRAIN TECHNIQUE: Multidetector CT imaging of the head and neck was performed using the standard protocol during bolus administration of intravenous contrast. Multiplanar CT image reconstructions and MIPs were obtained to evaluate the vascular anatomy. Carotid  stenosis measurements (when applicable) are obtained utilizing NASCET criteria, using the distal internal carotid diameter as the denominator. Multiphase CT imaging of the brain was performed following IV bolus contrast injection. Subsequent parametric perfusion maps were calculated using RAPID software. CONTRAST:  11mL OMNIPAQUE IOHEXOL 350 MG/ML SOLN COMPARISON:  CT head 04/17/2020 FINDINGS: CTA NECK FINDINGS Aortic arch: On the initial scan, there is no arterial enhancement due to early trigger of the machine. Patient was rescanned immediately after the initial scan with satisfactory opacification of the intracranial arterial system. The neck was not re-scanned to evaluate the carotid and vertebral arteries in the neck. Right carotid system: Not evaluated due to lack of arterial contrast. Left carotid system: Not evaluated due to lack of arterial contrast. Vertebral arteries: Not evaluated. Skeleton: No acute abnormality. Other neck: Question left thyroidectomy. Right thyroid mildly enlarged without focal abnormality. No adenopathy in the neck. Upper chest: Mild hilar adenopathy bilaterally.  Lung apices clear. Review of the MIP images confirms the above findings CTA HEAD FINDINGS Anterior circulation: Internal carotid artery widely patent through the cavernous segment bilaterally. Occlusion left M1 segment proximally. There is collateral circulation with good opacification of left M2 and M3 branches. Both anterior cerebral arteries are patent without stenosis. Right middle cerebral artery widely patent without stenosis. Posterior circulation: Both vertebral arteries patent to the basilar. PICA patent bilaterally. Basilar widely patent. Superior cerebellar and posterior cerebral arteries patent bilaterally without stenosis or large vessel occlusion. Venous sinuses: Normal venous enhancement. Anatomic variants: None Review of the MIP images confirms the above findings CT Brain Perfusion Findings: ASPECTS: 9 CBF  (<30%) Volume: 0ML Perfusion (Tmax>6.0s) volume: 71mL Mismatch Volume: 73mL Infarction Location:Left MCA territory involving the frontal and parietal lobe. IMPRESSION: 1. CT perfusion positive for 64 mL of delayed perfusion in the left MCA territory. No fixed infarct on perfusion. 2. Occlusion proximal left M1 segment with good distal collateral flow to the  left MCA branches. 3. No other intracranial stenosis 4. Carotid vertebral arteries in the neck were not evaluated due to lack of arterial opacification. The scan was performed early in the pulmonary arterial phase and subsequently immediately repeated with adequate intracranial arterial enhancement. 5. Images were reviewed with Dr. Leonel Ramsay following completion of the study Electronically Signed   By: Franchot Gallo M.D.   On: 04/17/2020 10:28   CT HEAD CODE STROKE WO CONTRAST  Result Date: 04/17/2020 CLINICAL DATA:  Code stroke. Neuro deficit, acute stroke suspected. Right-sided weakness. EXAM: CT HEAD WITHOUT CONTRAST TECHNIQUE: Contiguous axial images were obtained from the base of the skull through the vertex without intravenous contrast. COMPARISON:  None. FINDINGS: Brain: Ill-defined hypodensity of the left putamen (for example see series 3, image 16). No evidence of acute hemorrhage, hydrocephalus, extra-axial collection or mass lesion/mass effect. Vascular: No definite hyperdense vessel. Skull: No acute fracture. Sinuses/Orbits: Left maxillary sinus retention cyst versus polyp. Unremarkable orbits. Other: No mastoid effusions. ASPECTS [facility] Stroke Program Early CT Score) - Ganglionic level infarction (caudate, lentiform nuclei, internal capsule, insula, M1-M3 cortex): 6 - Supraganglionic infarction (M4-M6 cortex): 3 Total score (0-10 with 10 being normal): 9 IMPRESSION: 1. Subtle hypodensity involving the left putamen, which may represent acute left MCA territory infarct. ASPECTS is 9. MRI could further characterize if clinically indicated. 2.  No acute hemorrhage. Code stroke imaging results were communicated on 04/17/2020 at 10:01 am to provider Dr. Leonel Ramsay via telephone, who verbally acknowledged these results. Electronically Signed   By: Margaretha Sheffield MD   On: 04/17/2020 10:07    Procedures .Critical Care Performed by: Lucrezia Starch, MD Authorized by: Lucrezia Starch, MD   Critical care provider statement:    Critical care time (minutes):  34   Critical care was necessary to treat or prevent imminent or life-threatening deterioration of the following conditions:  CNS failure or compromise   Critical care was time spent personally by me on the following activities:  Discussions with consultants, evaluation of patient's response to treatment, examination of patient, ordering and performing treatments and interventions, ordering and review of laboratory studies, ordering and review of radiographic studies, pulse oximetry, re-evaluation of patient's condition, obtaining history from patient or surrogate and review of old charts   (including critical care time)  Medications Ordered in ED Medications  sodium chloride flush (NS) 0.9 % injection 3 mL (has no administration in time range)  aspirin 81 MG chewable tablet (has no administration in time range)  verapamil (ISOPTIN) 2.5 MG/ML injection (has no administration in time range)  ticagrelor (BRILINTA) 90 MG tablet (has no administration in time range)  clopidogrel (PLAVIX) 300 MG tablet (has no administration in time range)  cangrelor (KENGREAL) 50 MG SOLR (has no administration in time range)  tirofiban (AGGRASTAT) 5-0.9 MG/100ML-% injection (has no administration in time range)  eptifibatide (INTEGRILIN) 20 MG/10ML injection (has no administration in time range)  nitroGLYCERIN 100 mcg/mL intra-arterial injection (has no administration in time range)  ceFAZolin (ANCEF) 2-4 GM/100ML-% IVPB (has no administration in time range)   stroke: mapping our early stages of  recovery book (has no administration in time range)  0.9 %  sodium chloride infusion (has no administration in time range)  acetaminophen (TYLENOL) tablet 650 mg (has no administration in time range)    Or  acetaminophen (TYLENOL) 160 MG/5ML solution 650 mg (has no administration in time range)    Or  acetaminophen (TYLENOL) suppository 650 mg (has no administration in time range)  aspirin suppository 300 mg (has no administration in time range)    Or  aspirin tablet 325 mg (has no administration in time range)  enoxaparin (LOVENOX) injection 40 mg (has no administration in time range)  iohexol (OMNIPAQUE) 300 MG/ML solution 150 mL (has no administration in time range)  iohexol (OMNIPAQUE) 300 MG/ML solution 50 mL (has no administration in time range)  lidocaine (PF) (XYLOCAINE) 1 % injection (10 mLs Infiltration Given 04/17/20 1058)  iohexol (OMNIPAQUE) 350 MG/ML injection 100 mL (100 mLs Intravenous Contrast Given 04/17/20 1016)    ED Course  I have reviewed the triage vital signs and the nursing notes.  Pertinent labs & imaging results that were available during my care of the patient were reviewed by me and considered in my medical decision making (see chart for details).  Clinical Course as of Apr 17 1104  Tue Apr 17, 2020  1017 To IR with neuro   [RD]    Clinical Course User Index [RD] Lucrezia Starch, MD   MDM Rules/Calculators/A&P                          49 year old lady presenting to the emergency room as stroke alert.  Last known well last night.  On exam concern for right-sided weakness.  Airway cleared by myself.  Emergently to CT with neurology.  CTA concerning for left M1 occlusion.  Outside window for TPA.  Taken emergently to IR.  Will be admitted to Dr. Leonel Ramsay service post IR.  Final Clinical Impression(s) / ED Diagnoses Final diagnoses:  Cerebrovascular accident (CVA), unspecified mechanism ([place])    Rx / DC Orders ED Discharge Orders    None        Lucrezia Starch, MD 04/17/20 1105

## 2020-04-17 NOTE — Consult Note (Addendum)
Advanced Heart Failure Team Consult Note   Primary Physician: [name], MD PCP-Cardiologist:  [name], MD  Reason for Consultation: Heart Failure   HPI:    [name] is seen today for evaluation of heart failure at the request of Dr Leonel Ramsay.  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 Most recent ECHO EF was remained 20-25%.   Over the last several years she has been followed closely in the HF clinic. Last seen 02/2020 at that time she was stable.  Presented ED via EMS with RUE paralysis and difficulty speaking. Code stroke called. CT with LMCA stroke.Taken to IR for thrombectomy. Remains intubated. BB and ARNI stopped during acute phase.   ECHO today showed EF ~20% RV ok.    CPX 8/21 showed moderate functional limitation, primarily due to obesity and deconditioning. Mild limitation due to HF   Echo (8/15) with EF 20%, moderate LV dilation, diffuse hypokinesis, normal RV size with mildly decreased systolic function, mild MR.  Echo (5/16) with EF 20%, spherical LV with diffuse hypokinesis, mild MR.  Echo (6/17) with EF 20%, diffuse hypokinesis, moderate LV dilation, normal RV, mild to moderate MR.  Echo (8/18) with EF 25-30%, severe LV dilation, severe LAE.  Echo (2/20) with EF 20-25%, moderate LV dilation, moderate MR.  Echo (4/21) with EF 20-25%, severe LV dilation, mild-moderate MR, normal RV, PASP 26 mmHg  Review of Systems: [y] = yes, [ ]  = no Patient is encephalopathic and or intubated. Therefore history has been obtained from chart review.   . General: Weight gain [ ] ; Weight loss [ ] ; Anorexia [ ] ; Fatigue [ Y]; Fever [ ] ; Chills [ ] ; Weakness [ ]   . Cardiac: Chest pain/pressure [ ] ; Resting SOB [ ] ; Exertional SOB [ Y]; Orthopnea [Y ]; Pedal Edema [Y; Palpitations [ ] ; Syncope [ ] ; Presyncope [ ] ;  Paroxysmal nocturnal dyspnea[ ]   . Pulmonary: Cough [ ] ; Wheezing[ ] ; Hemoptysis[ ] ; Sputum [ ] ; Snoring [ ]   . GI: Vomiting[ ] ; Dysphagia[ ] ; Melena[ ] ; Hematochezia [ ] ; Heartburn[ ] ; Abdominal pain [ ] ; Constipation [ ] ; Diarrhea [ ] ; BRBPR [ ]   . GU: Hematuria[ ] ; Dysuria [ ] ; Nocturia[ ]   . Vascular: Pain in legs with walking [ ] ; Pain in feet with lying flat [ ] ; Non-healing sores [ ] ; Stroke [ ] ; TIA [ ] ; Slurred speech [ ] ;  . Neuro: Headaches[ ] ; Vertigo[ ] ; Seizures[ ] ; Paresthesias[ ] ;Blurred vision [ ] ; Diplopia [ ] ; Vision changes [ ]   . Ortho/Skin: Arthritis [ ] ; Joint pain [ Y]; Muscle pain [ ] ; Joint swelling [ ] ; Back Pain [Y ]; Rash [ ]   . Psych: Depression[ ] ; Anxiety[ ]   . Heme: Bleeding problems [ ] ; Clotting disorders [ ] ; Anemia [ ]   . Endocrine: Diabetes [ ] ; Thyroid dysfunction[Y ]  Home Medications Prior to Admission medications   Medication Sig Start Date End Date Taking? Authorizing Provider  amiodarone (PACERONE) 200 MG tablet Take 1 tablet (200 mg total) by mouth daily. 03/15/19  Yes Larey Dresser, MD  atorvastatin (LIPITOR) 40 MG tablet TAKE 1 TABLET BY MOUTH ONCE A DAY Patient taking differently: Take 40 mg by mouth daily.  02/06/20  Yes Larey Dresser, MD  carvedilol (COREG) 25 MG tablet TAKE 1 TABLET BY MOUTH TWICE A DAY WITH A MEAL  Patient taking differently: Take 25 mg by mouth 2 (two) times daily with a meal.  03/15/19  Yes Larey Dresser, MD  CORLANOR 5 MG TABS tablet TAKE 1 TABLET BY MOUTH TWICE (2) DAILY WITH MEALS Patient taking differently: Take 5 mg by mouth 2 (two) times daily with a meal.  06/14/19  Yes Larey Dresser, MD  dapagliflozin propanediol (FARXIGA) 10 MG TABS tablet Take 10 mg by mouth daily before breakfast. 09/27/19  Yes Larey Dresser, MD  digoxin (LANOXIN) 0.125 MG tablet TAKE 1 TABLET BY MOUTH ONCE A DAY Patient taking differently: Take 0.125 mg by mouth daily.  03/05/20  Yes Larey Dresser, MD  ENTRESTO 49-51 MG TAKE 1 TABLET  BY MOUTH TWICE (2) DAILY Patient taking differently: Take 1 tablet by mouth 2 (two) times daily.  02/06/20  Yes Larey Dresser, MD  spironolactone (ALDACTONE) 25 MG tablet TAKE 1 TABLET BY MOUTH EVERY NIGHT AT BEDTIME Patient taking differently: Take 25 mg by mouth at bedtime.  10/06/19  Yes Larey Dresser, MD  torsemide (DEMADEX) 20 MG tablet Take 4 tablets (80 mg total) by mouth 2 (two) times daily for 3 days, THEN 4 tablets (80 mg total) in the morning for 30 days, THEN 3 tablets (60 mg total) every evening. Patient taking differently: Take 4 tablets (80mg ) in the morning and 2 tablets (40mg ) in the evening. 01/30/20 04/17/20 Yes Simmons, Brittainy M, PA-C  levothyroxine (SYNTHROID) 25 MCG tablet Take 1 tablet (25 mcg total) by mouth daily before breakfast. 09/23/18   Deboraha Sprang, MD  metolazone (ZAROXOLYN) 2.5 MG tablet Take 1 tablet (2.5 mg total) by mouth as needed (extra fluid as directed by HF clinic). 02/23/20   Lyda Jester M, PA-C  potassium chloride SA (KLOR-CON) 20 MEQ tablet Take 2 tablets (40 mEq total) by mouth 2 (two) times daily. Patient taking differently: Take 40 mEq by mouth 2 (two) times daily. Taking 2 tab in the morning and 2 tab at night 02/01/20   Lyda Jester M, PA-C  Vitamin D, Ergocalciferol, (DRISDOL) 1.25 MG (50000 UNIT) CAPS capsule Take 1 capsule (50,000 Units total) by mouth every 7 (seven) days. 01/17/20   Abby Potash, PA-C    Past Medical History: Past Medical History:  Diagnosis Date  . Automatic implantable cardioverter-defibrillator in situ   . Chronic CHF (congestive heart failure) (HCC)    a. EF 15-20% b. RHC (09/2013) RA 14, RV 57/22, PA 64/36 (48), PCWP 18, FIck CO/CI 3.7/1.6, PVR 8.1 WU, PA sat 47%   . History of stomach ulcers   . Hypertension   . Hypotension   . Hypothyroidism   . Morbid obesity ([place])   . Myocardial infarction ([place]) 08/2013  . Nocturnal dyspnea   . Nonischemic cardiomyopathy ([place])   . Sinus tachycardia   .  Snoring-prob OSA 09/04/2011  . Sprint Fidelis ICD lead RECALL  [id number]   . UARS (upper airway resistance syndrome) 09/04/2011   HST 12/2013:  AHI 4/hr (numerous episodes of airflow reduction that did not have concomitant desaturation)     Past Surgical History: Past Surgical History:  Procedure Laterality Date  . BREATH TEK H PYLORI N/A 11/09/2014   Procedure: BREATH TEK H PYLORI;  Surgeon: Greer Pickerel, MD;  Location: [facility];  Service: General;  Laterality: N/A;  . CARDIAC CATHETERIZATION  ~ 2006; 09/2013  . CARDIAC CATHETERIZATION N/A 05/23/2016   Procedure: Right Heart Cath;  Surgeon: Larey Dresser, MD;  Location: [facility];  Service: Cardiovascular;  Laterality: N/A;  . CARDIAC DEFIBRILLATOR PLACEMENT  2006; 12/26/2013   Medtronic [id number]; 12-2013 ICD gen change and RV lead revision with new 6935 RV lead by Dr [name]  . CESAREAN SECTION  1999  . IMPLANTABLE CARDIOVERTER DEFIBRILLATOR GENERATOR CHANGE N/A 12/26/2013   Procedure: IMPLANTABLE CARDIOVERTER DEFIBRILLATOR GENERATOR CHANGE;  Surgeon: Deboraha Sprang, MD;  Location[facility];  Service: Cardiovascular;  Laterality: N/A;  . LEAD REVISION N/A 12/26/2013   Procedure: LEAD REVISION;  Surgeon: Deboraha Sprang, MD;  Location: [facility] CATH LAB;  Service: Cardiovascular;  Laterality: N/A;  . RIGHT HEART CATHETERIZATION N/A 02/15/2014   Procedure: RIGHT HEART CATH;  Surgeon: Larey Dresser, MD;  Location: [facility] CATH LAB;  Service: Cardiovascular;  Laterality: N/A;  . TUBAL LIGATION  1999    Family History: Family History  Problem Relation Age of Onset  . Heart disease Maternal Grandmother   . Heart disease Mother   . Obesity Mother   . High blood pressure Father   . Stroke Father     Social History: Social History   Socioeconomic History  . Marital status: Married    Spouse name: Raynell Upton  . Number of children: 2  . Years of education: Not on file  . Highest education level: Not on file  Occupational History  .  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 Smoking status: Never Smoker  . Smokeless tobacco: Never Used  Vaping Use  . Vaping Use: Never used  Substance and Sexual Activity  . Alcohol use: No  . Drug use: No  . Sexual activity: Yes  Other Topics Concern  . Not on file  Social History Narrative  . Not on file   Social Determinants of Health   Financial Resource Strain:   . Difficulty of Paying Living Expenses: Not on file  Food Insecurity:   . Worried About Charity fundraiser in the Last Year: Not on file  . Ran Out of Food in the Last Year: Not on file  Transportation Needs:   . Lack of Transportation (Medical): Not on file  . Lack of Transportation (Non-Medical): Not on file  Physical Activity:   . Days of Exercise per Week: Not on file  . Minutes of Exercise per Session: Not on file  Stress:   . Feeling of Stress : Not on file  Social Connections:   . Frequency of Communication with Friends and Family: Not on file  . Frequency of Social Gatherings with Friends and Family: Not on file  . Attends Religious Services: Not on file  . Active Member of Clubs or Organizations: Not on file  . Attends Archivist Meetings: Not on file  . Marital Status: Not on file    Allergies:  No Known Allergies  Objective:    Vital Signs:   Pulse Rate:  [88-89] 89 (10/26 1000) Resp:  [16-17] 17 (10/26 1000) BP: (93-112)/(75-79) 93/75 (10/26 1000) SpO2:  [98 %-100 %] 100 % (10/26 1000) Weight:  [114.5 kg] 114.5 kg (10/26 0900)    Weight change: Filed Weights   04/17/20 0900  Weight: 114.5 kg    Intake/Output:   Intake/Output Summary (Last 24 hours) at 04/17/2020 1219 Last data filed at 04/17/2020 1043 Gross per 24 hour  Intake 50 ml  Output --  Net 50 ml      Physical Exam    General:  Intubated/sedated HEENT: ETT Neck: supple. JVP  difficult to assess . Carotids 2+ bilat; no bruits. No lymphadenopathy or thyromegaly appreciated. Cor:  PMI nondisplaced. Regular rate & rhythm. No rubs, gallops or murmurs. Lungs: clear Abdomen: soft, nontender, nondistended. No hepatosplenomegaly. No bruits or masses. Good bowel sounds. Extremities: no cyanosis, clubbing, rash, edema Neuro: intubated/sedated   Telemetry  SR with frequent PVCs   EKG    SR 80 bpm PVCs  Labs   Basic Metabolic Panel: Recent Labs  Lab 04/17/20 0949 04/17/20 1000  NA 138 140  K 3.8 3.8  CL 106 107  CO2 22  --   GLUCOSE 106* 106*  BUN 16 17  CREATININE 1.28* 1.20*  CALCIUM 9.0  --     Liver Function Tests: Recent Labs  Lab 04/17/20 0949  AST 18  ALT 14  ALKPHOS 54  BILITOT 0.6  PROT 6.9  ALBUMIN 3.4*   No results for input(s): LIPASE, AMYLASE in the last 168 hours. No results for input(s): AMMONIA in the last 168 hours.  CBC: Recent Labs  Lab 04/17/20 0949 04/17/20 1000  WBC 9.1  --   NEUTROABS 6.6  --   HGB 11.2* 11.9*  HCT 38.0 35.0*  MCV 82.1  --   PLT 304  --     Cardiac Enzymes: No results for input(s): CKTOTAL, CKMB, CKMBINDEX, TROPONINI in the last 168 hours.  BNP: BNP (last 3 results) Recent Labs    01/26/20 1030  BNP 929.8*    ProBNP (last 3 results) No results for input(s): PROBNP in the last 8760 hours.   CBG: Recent Labs  Lab 04/17/20 0949  GLUCAP 109*    Coagulation Studies: Recent Labs    04/17/20 0949  LABPROT 13.8  INR 1.1     Imaging   CT Code Stroke CTA Head W/WO contrast  Result Date: 04/17/2020 CLINICAL DATA:  Right-sided weakness. EXAM: CT ANGIOGRAPHY HEAD AND NECK CT PERFUSION BRAIN TECHNIQUE: Multidetector CT imaging of the head and neck was performed using the standard protocol during bolus administration of intravenous contrast. Multiplanar CT image reconstructions and MIPs were obtained to evaluate the vascular anatomy. Carotid stenosis measurements (when applicable) are obtained utilizing NASCET criteria, using the distal internal carotid diameter as the denominator.  Multiphase CT imaging of the brain was performed following IV bolus contrast injection. Subsequent parametric perfusion maps were calculated using RAPID software. CONTRAST:  167mL OMNIPAQUE IOHEXOL 350 MG/ML SOLN COMPARISON:  CT head 04/17/2020 FINDINGS: CTA NECK FINDINGS Aortic arch: On the initial scan, there is no arterial enhancement due to early trigger of the machine. Patient was rescanned immediately after the initial scan with satisfactory opacification of the intracranial arterial system. The neck was not re-scanned to evaluate the carotid and vertebral arteries in the neck. Right carotid system: Not evaluated due to lack of arterial contrast. Left carotid system: Not evaluated due to lack of arterial contrast. Vertebral arteries: Not evaluated. Skeleton: No acute abnormality. Other neck: Question left thyroidectomy. Right thyroid mildly enlarged without focal abnormality. No adenopathy in the neck. Upper chest: Mild hilar adenopathy bilaterally.  Lung apices clear. Review of the MIP images confirms the above findings CTA HEAD FINDINGS Anterior circulation: Internal carotid artery widely patent through the cavernous segment bilaterally. Occlusion left M1 segment proximally. There is collateral circulation with good opacification of left M2 and M3 branches. Both anterior cerebral arteries are patent without stenosis. Right middle cerebral artery widely patent without stenosis. Posterior circulation: Both vertebral arteries patent to the basilar. PICA patent bilaterally. Basilar widely patent. Superior  cerebellar and posterior cerebral arteries patent bilaterally without stenosis or large vessel occlusion. Venous sinuses: Normal venous enhancement. Anatomic variants: None Review of the MIP images confirms the above findings CT Brain Perfusion Findings: ASPECTS: 9 CBF (<30%) Volume: 0ML Perfusion (Tmax>6.0s) volume: 70mL Mismatch Volume: 28mL Infarction Location:Left MCA territory involving the frontal and  parietal lobe. IMPRESSION: 1. CT perfusion positive for 64 mL of delayed perfusion in the left MCA territory. No fixed infarct on perfusion. 2. Occlusion proximal left M1 segment with good distal collateral flow to the left MCA branches. 3. No other intracranial stenosis 4. Carotid vertebral arteries in the neck were not evaluated due to lack of arterial opacification. The scan was performed early in the pulmonary arterial phase and subsequently immediately repeated with adequate intracranial arterial enhancement. 5. Images were reviewed with Dr. Leonel Ramsay following completion of the study Electronically Signed   By: Franchot Gallo M.D.   On: 04/17/2020 10:28   CT Code Stroke CTA Neck W/WO contrast  Result Date: 04/17/2020 CLINICAL DATA:  Right-sided weakness. EXAM: CT ANGIOGRAPHY HEAD AND NECK CT PERFUSION BRAIN TECHNIQUE: Multidetector CT imaging of the head and neck was performed using the standard protocol during bolus administration of intravenous contrast. Multiplanar CT image reconstructions and MIPs were obtained to evaluate the vascular anatomy. Carotid stenosis measurements (when applicable) are obtained utilizing NASCET criteria, using the distal internal carotid diameter as the denominator. Multiphase CT imaging of the brain was performed following IV bolus contrast injection. Subsequent parametric perfusion maps were calculated using RAPID software. CONTRAST:  117mL OMNIPAQUE IOHEXOL 350 MG/ML SOLN COMPARISON:  CT head 04/17/2020 FINDINGS: CTA NECK FINDINGS Aortic arch: On the initial scan, there is no arterial enhancement due to early trigger of the machine. Patient was rescanned immediately after the initial scan with satisfactory opacification of the intracranial arterial system. The neck was not re-scanned to evaluate the carotid and vertebral arteries in the neck. Right carotid system: Not evaluated due to lack of arterial contrast. Left carotid system: Not evaluated due to lack of arterial  contrast. Vertebral arteries: Not evaluated. Skeleton: No acute abnormality. Other neck: Question left thyroidectomy. Right thyroid mildly enlarged without focal abnormality. No adenopathy in the neck. Upper chest: Mild hilar adenopathy bilaterally.  Lung apices clear. Review of the MIP images confirms the above findings CTA HEAD FINDINGS Anterior circulation: Internal carotid artery widely patent through the cavernous segment bilaterally. Occlusion left M1 segment proximally. There is collateral circulation with good opacification of left M2 and M3 branches. Both anterior cerebral arteries are patent without stenosis. Right middle cerebral artery widely patent without stenosis. Posterior circulation: Both vertebral arteries patent to the basilar. PICA patent bilaterally. Basilar widely patent. Superior cerebellar and posterior cerebral arteries patent bilaterally without stenosis or large vessel occlusion. Venous sinuses: Normal venous enhancement. Anatomic variants: None Review of the MIP images confirms the above findings CT Brain Perfusion Findings: ASPECTS: 9 CBF (<30%) Volume: 0ML Perfusion (Tmax>6.0s) volume: 2mL Mismatch Volume: 74mL Infarction Location:Left MCA territory involving the frontal and parietal lobe. IMPRESSION: 1. CT perfusion positive for 64 mL of delayed perfusion in the left MCA territory. No fixed infarct on perfusion. 2. Occlusion proximal left M1 segment with good distal collateral flow to the left MCA branches. 3. No other intracranial stenosis 4. Carotid vertebral arteries in the neck were not evaluated due to lack of arterial opacification. The scan was performed early in the pulmonary arterial phase and subsequently immediately repeated with adequate intracranial arterial enhancement. 5. Images were reviewed with  Dr. Leonel Ramsay following completion of the study Electronically Signed   By: Franchot Gallo M.D.   On: 04/17/2020 10:28   CT Code Stroke Cerebral Perfusion with  contrast  Result Date: 04/17/2020 CLINICAL DATA:  Right-sided weakness. EXAM: CT ANGIOGRAPHY HEAD AND NECK CT PERFUSION BRAIN TECHNIQUE: Multidetector CT imaging of the head and neck was performed using the standard protocol during bolus administration of intravenous contrast. Multiplanar CT image reconstructions and MIPs were obtained to evaluate the vascular anatomy. Carotid stenosis measurements (when applicable) are obtained utilizing NASCET criteria, using the distal internal carotid diameter as the denominator. Multiphase CT imaging of the brain was performed following IV bolus contrast injection. Subsequent parametric perfusion maps were calculated using RAPID software. CONTRAST:  165mL OMNIPAQUE IOHEXOL 350 MG/ML SOLN COMPARISON:  CT head 04/17/2020 FINDINGS: CTA NECK FINDINGS Aortic arch: On the initial scan, there is no arterial enhancement due to early trigger of the machine. Patient was rescanned immediately after the initial scan with satisfactory opacification of the intracranial arterial system. The neck was not re-scanned to evaluate the carotid and vertebral arteries in the neck. Right carotid system: Not evaluated due to lack of arterial contrast. Left carotid system: Not evaluated due to lack of arterial contrast. Vertebral arteries: Not evaluated. Skeleton: No acute abnormality. Other neck: Question left thyroidectomy. Right thyroid mildly enlarged without focal abnormality. No adenopathy in the neck. Upper chest: Mild hilar adenopathy bilaterally.  Lung apices clear. Review of the MIP images confirms the above findings CTA HEAD FINDINGS Anterior circulation: Internal carotid artery widely patent through the cavernous segment bilaterally. Occlusion left M1 segment proximally. There is collateral circulation with good opacification of left M2 and M3 branches. Both anterior cerebral arteries are patent without stenosis. Right middle cerebral artery widely patent without stenosis. Posterior  circulation: Both vertebral arteries patent to the basilar. PICA patent bilaterally. Basilar widely patent. Superior cerebellar and posterior cerebral arteries patent bilaterally without stenosis or large vessel occlusion. Venous sinuses: Normal venous enhancement. Anatomic variants: None Review of the MIP images confirms the above findings CT Brain Perfusion Findings: ASPECTS: 9 CBF (<30%) Volume: 0ML Perfusion (Tmax>6.0s) volume: 66mL Mismatch Volume: 62mL Infarction Location:Left MCA territory involving the frontal and parietal lobe. IMPRESSION: 1. CT perfusion positive for 64 mL of delayed perfusion in the left MCA territory. No fixed infarct on perfusion. 2. Occlusion proximal left M1 segment with good distal collateral flow to the left MCA branches. 3. No other intracranial stenosis 4. Carotid vertebral arteries in the neck were not evaluated due to lack of arterial opacification. The scan was performed early in the pulmonary arterial phase and subsequently immediately repeated with adequate intracranial arterial enhancement. 5. Images were reviewed with Dr. Leonel Ramsay following completion of the study Electronically Signed   By: Franchot Gallo M.D.   On: 04/17/2020 10:28   CT HEAD CODE STROKE WO CONTRAST  Result Date: 04/17/2020 CLINICAL DATA:  Code stroke. Neuro deficit, acute stroke suspected. Right-sided weakness. EXAM: CT HEAD WITHOUT CONTRAST TECHNIQUE: Contiguous axial images were obtained from the base of the skull through the vertex without intravenous contrast. COMPARISON:  None. FINDINGS: Brain: Ill-defined hypodensity of the left putamen (for example see series 3, image 16). No evidence of acute hemorrhage, hydrocephalus, extra-axial collection or mass lesion/mass effect. Vascular: No definite hyperdense vessel. Skull: No acute fracture. Sinuses/Orbits: Left maxillary sinus retention cyst versus polyp. Unremarkable orbits. Other: No mastoid effusions. ASPECTS [facility] Stroke Program Early CT  Score) - Ganglionic level infarction (caudate, lentiform nuclei, internal capsule, insula,  M1-M3 cortex): 6 - Supraganglionic infarction (M4-M6 cortex): 3 Total score (0-10 with 10 being normal): 9 IMPRESSION: 1. Subtle hypodensity involving the left putamen, which may represent acute left MCA territory infarct. ASPECTS is 9. MRI could further characterize if clinically indicated. 2. No acute hemorrhage. Code stroke imaging results were communicated on 04/17/2020 at 10:01 am to provider Dr. Leonel Ramsay via telephone, who verbally acknowledged these results. Electronically Signed   By: Margaretha Sheffield MD   On: 04/17/2020 10:07      Medications:     Current Medications: .  stroke: mapping our early stages of recovery book   Does not apply Once  . amiodarone  200 mg Oral Daily  . aspirin      . aspirin  300 mg Rectal Daily   Or  . aspirin  325 mg Oral Daily  . atorvastatin  40 mg Oral Daily  . cangrelor      . clopidogrel      . [START ON 04/18/2020] dapagliflozin propanediol  10 mg Oral QAC breakfast  . digoxin  0.125 mg Oral Daily  . enoxaparin (LOVENOX) injection  40 mg Subcutaneous Q24H  . eptifibatide      . [START ON 04/18/2020] levothyroxine  25 mcg Oral QAC breakfast  . nitroGLYCERIN      . sodium chloride flush  3 mL Intravenous Once  . ticagrelor      . verapamil         Infusions: . sodium chloride    . ceFAZolin    . 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  Admitted with LMCA stroke.  Assessment/Plan   1. 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  IR performed S/P  Thrombectomy. Remains intubated.  Hold HTN meds. Aim to keep SBP 140 or >   2. Chronic Systolic HF NICM dating back to 2015. Possible Peripartum. Had Medtronic ICD ECHO today  with EF ~20% RV ok Hold bb, arni, SGT2i with stroke.  Volume status ok.   3. H/O VT /PVCs Continue amio 200 mg daily. May need start IV amio if ectopy increased.   4.  Hypothyroidism  5. . OSA Recently diagnosed.  We will follow with you.    Length of Stay: 0  Darrick Grinder, NP  04/17/2020, 12:19 PM  Advanced Heart Failure Team Pager [phone] (M-F; [place])  Please contact [facility] for night-coverage after hours (4p -7a ) and weekends on [email or web address]  Agree with above.   31 women with severe systolic HF due to NICM (EF 20%) due to possible peripartum CM - baseline NYHA II-III, VT/frequent PVCs admitted with acute left MCA stroke.   Now s/p percutaneous thrombectomy.   She remains intubated and sedated.  Echo at bedside reviewed personally. EF 20% RV normal. No obvious LV clot. SBP 100-120   General: Intubated/sedated HEENT: normal + ETT Neck: supple. no JVD. Carotids 2+ bilat; no bruits. No lymphadenopathy or thryomegaly appreciated. Cor: PMI nondisplaced. Regular rate & rhythm with frequent ectopy.. No rubs, gallops or murmurs. Lungs: clear Abdomen: soft, nontender, nondistended. No hepatosplenomegaly. No bruits or masses. Good bowel sounds. Extremities: no cyanosis, clubbing, rash, edema warm Neuro: intubated sedated  Despite lack of LV clot on echo suspect likely cardio-embolic event. Currently  HF seems relatively well compensated. Will hold HF meds to allow BP to drift up to goal 140. Can give IV lasix as needed to manage volume status. She has frequent polymorphic PVCs on the monitor. Keep K> 4.0 and MG > 2.0. If develops increasing ectopy can start IV amio. We will follow. D/w CCM at bedside.   CRITICAL CARE Performed by: Glori Bickers  Total critical care time: 35 minutes  Critical care time was exclusive of separately billable procedures and treating other patients.  Critical care was necessary to treat or prevent imminent or life-threatening  deterioration.  Critical care was time spent personally by me (independent of midlevel providers or residents) on the following activities: development of treatment plan with patient and/or surrogate as well as nursing, discussions with consultants, evaluation of patient's response to treatment, examination of patient, obtaining history from patient or surrogate, ordering and performing treatments and interventions, ordering and review of laboratory studies, ordering and review of radiographic studies, pulse oximetry and re-evaluation of patient's condition.  Glori Bickers, MD  4:08 PM

## 2020-04-17 NOTE — Anesthesia Preprocedure Evaluation (Addendum)
Anesthesia Evaluation  Patient identified by MRN, date of birth, ID band Patient awake  General Assessment Comment:aphasic  Reviewed: Allergy & PrecautionsPreop documentation limited or incomplete due to emergent nature of procedure.  Airway Mallampati: II  TM Distance: >3 FB     Dental   Pulmonary    + rhonchi        Cardiovascular hypertension, + Past MI and +CHF (EF 20%)  + Cardiac Defibrillator  Rhythm:Regular Rate:Tachycardia     Neuro/Psych CVA, Residual Symptoms    GI/Hepatic   Endo/Other  Hypothyroidism Morbid obesity  Renal/GU      Musculoskeletal   Abdominal   Peds  Hematology   Anesthesia Other Findings  CODE STROKE FOR IR  Reproductive/Obstetrics                           Anesthesia Physical Anesthesia Plan  ASA: IV and emergent  Anesthesia Plan: General   Post-op Pain Management:    Induction: Intravenous and Rapid sequence  PONV Risk Score and Plan: 3 and Treatment may vary due to age or medical condition and Ondansetron  Airway Management Planned: Oral ETT  Additional Equipment: Arterial line  Intra-op Plan:   Post-operative Plan: Possible Post-op intubation/ventilation  Informed Consent: I have reviewed the patients History and Physical, chart, labs and discussed the procedure including the risks, benefits and alternatives for the proposed anesthesia with the patient or authorized representative who has indicated his/her understanding and acceptance.       Plan Discussed with:   Anesthesia Plan Comments:         Anesthesia Quick Evaluation

## 2020-04-17 NOTE — Procedures (Signed)
S/P Lt common carotid artrriogram followed by complete revascularization of occluded Lt MCA M 1 seg with x 1 pass Tiger 21 retrieverv ,x 1 pass with embotrap 6 mm x 6.72mmx 45 mm retriever,x 1pass with solitaireX 75mmx 40 mm retriever,x1 pass with solitaire 6 mmx 40 mm and x 1 pass with Zoom aspiration and placement of a 4.5 mm x 30 mm Atlas stent in the inf division achieving a TICI 2C revascularization. CT x  2 brain neg for ICH before and after Cangrelor infusion. 56F angioseal for RT groin hemostasis. Distal pulses all dopplerable. Patient left intubated because of medical condition and CHF with EF of 20 to 25 %.  Meds.  Aspirin 81 mg and Brilinta180 mg via OG tube.   Bolus IV of Cangrelor followed by IV infusion for 4 hrs. CT brain at 5. 30 PM . S.Arjay Jaskiewicz MD

## 2020-04-17 NOTE — Consult Note (Addendum)
NAME:  [name], MRN:  [id number], DOB:  06-14-1971, LOS: 0 ADMISSION DATE:  04/17/2020, CONSULTATION DATE:  04/17/2020 REFERRING MD:  Dr. Leonel Ramsay, CHIEF COMPLAINT:  Stroke   Brief History   23 yoF with hx of HFrEF with EF 20% presenting with right sided weakness and difficulty speaking found to have acute left MCA M1 occlusion.  Out of window for tPA. Intubated and taken to [facility] for mechanical thrombectomy and stent placement.  Returns to ICU intubated and sedated.   History of present illness   HPI obtained from medical chart review as patient is sedated and intubated on mechanical ventilation.   49 year old female with past medical history as below who presented to the ER as a code stroke.  She was last known well 10/25 around 2200.  Reportedly, patient woke up this morning around 0630 not feeling well, with right sided symptoms which progressed.  She was able to walk down stairs, then husband found her around 0830 with right sided paralysis and difficulty speaking.    In ER, she was normotensive and in NSR with normal oxygen saturations.   CT head showed subtle hypodensity of the left putamen which may represent acute left MCA infarct but otherwise no acute hemorrhage.  Taken for CTA head/ neck/ perfusion study which showed left M1 segment occlusion with large area of penumbra.  She was out of the window for tPA.  She was intubated and taken to Neuro IR for mechanical thrombectomy and stent placement achieving complete revascularization of occluded left MCA M1 segment.  Given her history of systolic heart failure who is managed by the HF team, HF team consulted.  Patient returns to Neuro ICU intubated and sedated on mechanical ventilation, therefore PCCM consulted for further ventilator and medical management.   Past Medical History  HFrEF (EF 15-20%)/ NICM s/p medtronic ICD,  HTN, hypothyroid, morbid obesity,   Significant Hospital Events   10/26 Admitted by Neurology, s/p mechanical  thrombectomy and stent placement   Consults:  NIR PCCM   Procedures:  10/26 ETT >> 10/26 left radial aline  Significant Diagnostic Tests:  10/26 CTH >> 1. Subtle hypodensity involving the left putamen, which may represent acute left MCA territory infarct. ASPECTS is 9. MRI could further characterize if clinically indicated. 2. No acute hemorrhage.  10/26 CTA head/ neck/ perfusion >> 1. CT perfusion positive for 64 mL of delayed perfusion in the left MCA territory. No fixed infarct on perfusion. 2. Occlusion proximal left M1 segment with good distal collateral flow to the left MCA branches. 3. No other intracranial stenosis 4. Carotid vertebral arteries in the neck were not evaluated due to lack of arterial opacification. The scan was performed early in the pulmonary arterial phase and subsequently immediately repeated with adequate intracranial arterial enhancement. 5. Images were reviewed with Dr. Leonel Ramsay following completion of the study  MRA/ MRI  >> TTE >> Carotid dopplers >>  Micro Data:  10/26 SARS 2/ Flu  >> neg 10/26 MRSA PCR >>   Antimicrobials:  10/26 cefazolin (pre-op)  Interim history/subjective:   Objective   Blood pressure 124/85, pulse 74, resp. rate (!) 21, height 5' 3.5" (1.613 m), weight 114.5 kg, SpO2 100 %.    Vent Mode: PRVC FiO2 (%):  [50 %] 50 % Set Rate:  [12 bmp] 12 bmp Vt Set:  [430 mL] 430 mL PEEP:  [5 cmH20] 5 cmH20 Plateau Pressure:  [18 cmH20] 18 cmH20   Intake/Output Summary (Last 24 hours) at  04/17/2020 1501 Last data filed at 04/17/2020 1457 Gross per 24 hour  Intake 1750 ml  Output 30 ml  Net 1720 ml   Filed Weights   04/17/20 0900  Weight: 114.5 kg    Examination: General:  Critically ill female sedated and intubated on mechanical ventilation HEENT: MM pink/moist, ETT/ OGT, pupils 3/sluggish Neuro: sedated CV: rr ir (SR with PAC/ PVC), distant heart sounds, right groin site wnl/ dressing dry PULM:  MV supported  breaths, coarse GI: obese, soft, bs hypoactive  Extremities: warm/dry, no LE edema  Skin: no rashes  Resolved Hospital Problem list    Assessment & Plan:   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 s/p complete revascularization with mechanical thrombectomy and stent P:  Per Neurology  Serial neuro exams  SBP goal 140 Cangrelor f/b ASA / Brilinta per NIR  Repeat CTH at 1730 MRI/ MRA brain pending TTE, carotid dopplers, Hgb A1c, lipid panel per stroke workup   Acute respiratory insufficiency secondary to above P:  Full MV support, PRVC, Target TVol 6-8cc/kgIBW Target Plateau Pressure < 30cm H20 Target driving pressure less than 15 cm of water CXR/ ABG now VAP bundle/ PPI Daily SBT  PAD protocol with propofol/ prn fentanyl for RASS goal 0/-1   At risk for AKI P:  Trend renal indices Avoid nephrotoxins    HFrEF, 09/2019 EF 20% by TTE P:  Per HF team  Continuing digoxin, farxiga, amiodarone  Holding coreg, metolazone, spironolactone, torsemide Pending TTE Daily wts   Hx HTN P: Holding home hypertensives    Mild normocytic anemia P:  Trend CBC  Hypothyroidism P:  Continue home synthroid   Morbid Obesity, BMI 44 kg/m2  Best practice:  Diet: NPO Pain/Anxiety/Delirium protocol (if indicated): propofol/ fentanyl, RASS goal 0/-1 VAP protocol (if indicated): yes DVT prophylaxis: lovenox GI prophylaxis: PPI Glucose control: trend on BMET Mobility: BR Code Status: full  Family Communication: pending Disposition:  Neuro ICU  Labs   CBC: Recent Labs  Lab 04/17/20 0949 04/17/20 1000  WBC 9.1  --   NEUTROABS 6.6  --   HGB 11.2* 11.9*  HCT 38.0 35.0*  MCV 82.1  --   PLT 304  --     Basic Metabolic Panel: Recent Labs  Lab 04/17/20 0949 04/17/20 1000  NA 138 140  K 3.8 3.8  CL 106 107  CO2 22  --   GLUCOSE 106* 106*  BUN 16 17  CREATININE 1.28* 1.20*  CALCIUM 9.0  --    GFR: Estimated Creatinine Clearance: 69.8 mL/min (A) (by C-G  formula based on SCr of 1.2 mg/dL (H)). Recent Labs  Lab 04/17/20 0949  WBC 9.1    Liver Function Tests: Recent Labs  Lab 04/17/20 0949  AST 18  ALT 14  ALKPHOS 54  BILITOT 0.6  PROT 6.9  ALBUMIN 3.4*   No results for input(s): LIPASE, AMYLASE in the last 168 hours. No results for input(s): AMMONIA in the last 168 hours.  ABG    Component Value Date/Time   HCO3 24.0 05/23/2016 1327   TCO2 23 04/17/2020 1000   O2SAT 62.0 05/23/2016 1327     Coagulation Profile: Recent Labs  Lab 04/17/20 0949  INR 1.1    Cardiac Enzymes: No results for input(s): CKTOTAL, CKMB, CKMBINDEX, TROPONINI in the last 168 hours.  HbA1C: Hemoglobin A1C  Date/Time Value Ref Range Status  09/19/2013 02:09 PM 5.9 4.2 - 6.3 % Final    Comment:    The American Diabetes  Association recommends that a primary goal of therapy should be <7% and that physicians should reevaluate the treatment regimen in patients with HbA1c values consistently >8%.    Hgb A1c MFr Bld  Date/Time Value Ref Range Status  12/01/2019 04:13 PM 5.3 4.8 - 5.6 % Final    Comment:             Prediabetes: 5.7 - 6.4          Diabetes: >6.4          Glycemic control for adults with diabetes: <7.0   07/04/2019 01:30 PM 5.3 4.8 - 5.6 % Final    Comment:             Prediabetes: 5.7 - 6.4          Diabetes: >6.4          Glycemic control for adults with diabetes: <7.0     CBG: Recent Labs  Lab 04/17/20 0949  GLUCAP 109*    Review of Systems:   Unable as patient is sedated, intubated on mechanical ventilation  Past Medical History  She,  has a past medical history of Automatic implantable cardioverter-defibrillator in situ, Chronic CHF (congestive heart failure) ([place]), History of stomach ulcers, Hypertension, Hypotension, Hypothyroidism, Morbid obesity ([place]), Myocardial infarction ([place]) (08/2013), Nocturnal dyspnea, Nonischemic cardiomyopathy ([place]), Sinus tachycardia, Snoring-prob OSA (09/04/2011), [employer]  ICD lead RECALL  6949, and UARS (upper airway resistance syndrome) (09/04/2011).   Surgical History    Past Surgical History:  Procedure Laterality Date   BREATH TEK H PYLORI N/A 11/09/2014   Procedure: BREATH TEK Kandis Ban;  Surgeon: Greer Pickerel, MD;  Location: [facility];  Service: General;  Laterality: N/A;   CARDIAC CATHETERIZATION  ~ 2006; 09/2013   CARDIAC CATHETERIZATION N/A 05/23/2016   Procedure: Right Heart Cath;  Surgeon: Larey Dresser, MD;  Location: [facility];  Service: Cardiovascular;  Laterality: N/A;   CARDIAC DEFIBRILLATOR PLACEMENT  2006; 12/26/2013   Medtronic [id number]; 12-2013 ICD gen change and RV lead revision with new 6935 RV lead by Dr [name]   CESAREAN SECTION  1999   IMPLANTABLE CARDIOVERTER DEFIBRILLATOR GENERATOR CHANGE N/A 12/26/2013   Procedure: IMPLANTABLE CARDIOVERTER DEFIBRILLATOR GENERATOR CHANGE;  Surgeon: Deboraha Sprang, MD;  Location: [facility] CATH LAB;  Service: Cardiovascular;  Laterality: N/A;   LEAD REVISION N/A 12/26/2013   Procedure: LEAD REVISION;  Surgeon: Deboraha Sprang, MD;  Location: [facility] CATH LAB;  Service: Cardiovascular;  Laterality: N/A;   RIGHT HEART CATHETERIZATION N/A 02/15/2014   Procedure: RIGHT HEART CATH;  Surgeon: Larey Dresser, MD;  Location: [facility] CATH LAB;  Service: Cardiovascular;  Laterality: N/A;   TUBAL LIGATION  1999     Social History   reports that she has never smoked. She has never used smokeless tobacco. She reports that she does not drink alcohol and does not use drugs.   Family History   Her family history includes Heart disease in her maternal grandmother and mother; High blood pressure in her father; Obesity in her mother; Stroke in her father.   Allergies No Known Allergies   Home Medications  Prior to Admission medications   Medication Sig Start Date End Date Taking? Authorizing Provider  amiodarone (PACERONE) 200 MG tablet Take 1 tablet (200 mg total) by mouth daily. 03/15/19  Yes Larey Dresser, MD  atorvastatin (LIPITOR) 40 MG tablet TAKE 1 TABLET BY MOUTH ONCE A DAY Patient taking differently: Take 40 mg by mouth daily.  02/06/20  Yes Larey Dresser, MD  carvedilol (COREG) 25 MG tablet TAKE 1 TABLET BY MOUTH TWICE A DAY WITH A MEAL Patient taking differently: Take 25 mg by mouth 2 (two) times daily with a meal.  03/15/19  Yes Larey Dresser, MD  CORLANOR 5 MG TABS tablet TAKE 1 TABLET BY MOUTH TWICE (2) DAILY WITH MEALS Patient taking differently: Take 5 mg by mouth 2 (two) times daily with a meal.  06/14/19  Yes Larey Dresser, MD  dapagliflozin propanediol (FARXIGA) 10 MG TABS tablet Take 10 mg by mouth daily before breakfast. 09/27/19  Yes Larey Dresser, MD  digoxin (LANOXIN) 0.125 MG tablet TAKE 1 TABLET BY MOUTH ONCE A DAY Patient taking differently: Take 0.125 mg by mouth daily.  03/05/20  Yes Larey Dresser, MD  ENTRESTO 49-51 MG TAKE 1 TABLET BY MOUTH TWICE (2) DAILY Patient taking differently: Take 1 tablet by mouth 2 (two) times daily.  02/06/20  Yes Larey Dresser, MD  spironolactone (ALDACTONE) 25 MG tablet TAKE 1 TABLET BY MOUTH EVERY NIGHT AT BEDTIME Patient taking differently: Take 25 mg by mouth at bedtime.  10/06/19  Yes Larey Dresser, MD  torsemide (DEMADEX) 20 MG tablet Take 4 tablets (80 mg total) by mouth 2 (two) times daily for 3 days, THEN 4 tablets (80 mg total) in the morning for 30 days, THEN 3 tablets (60 mg total) every evening. Patient taking differently: Take 4 tablets (80mg ) in the morning and 2 tablets (40mg ) in the evening. 01/30/20 04/17/20 Yes Simmons, Brittainy M, PA-C  levothyroxine (SYNTHROID) 25 MCG tablet Take 1 tablet (25 mcg total) by mouth daily before breakfast. 09/23/18   Deboraha Sprang, MD  metolazone (ZAROXOLYN) 2.5 MG tablet Take 1 tablet (2.5 mg total) by mouth as needed (extra fluid as directed by HF clinic). 02/23/20   Lyda Jester M, PA-C  potassium chloride SA (KLOR-CON) 20 MEQ tablet Take 2 tablets (40 mEq total) by mouth  2 (two) times daily. Patient taking differently: Take 40 mEq by mouth 2 (two) times daily. Taking 2 tab in the morning and 2 tab at night 02/01/20   Lyda Jester M, PA-C  Vitamin D, Ergocalciferol, (DRISDOL) 1.25 MG (50000 UNIT) CAPS capsule Take 1 capsule (50,000 Units total) by mouth every 7 (seven) days. 01/17/20   Abby Potash, PA-C     Critical care time: 35 mins     Kennieth Rad, ACNP  Pulmonary & Critical Care 04/17/2020, 3:35 PM  See Amion for personal pager PCCM on call pager [phone]

## 2020-04-17 NOTE — Progress Notes (Signed)
Echocardiogram 2D Echocardiogram has been performed.  Oneal Deputy Buford Gayler 04/17/2020, 4:22 PM

## 2020-04-17 NOTE — Progress Notes (Signed)
Patient ID: [name], female   DOB: Jul 08, 1970, 49 y.o.   MRN: [id number] INR. 49 Y RT H F LSW 10/25  Yesterday evening. New onset  RT sided paralysis and difficulty speaking. CT BRAIN NO ICH . ASPECTS 9 CTA occluded Lt MCA M 1 . CTP no core and penumbra of 64 Ml .   Endovascular treatment D/W spouse on a 3 way call. Procedure,reasons and alternatives discussed. Risks of ICH of  10 to 15%,worsening neuro condition,death and inability to revascularize reviewed . Spouse expressed understanding and provided consent for the the treatment. S.Alandis Bluemel MD

## 2020-04-17 NOTE — H&P (Signed)
Neurology H&P  CC: Right-sided weakness  History is obtained from: Patient  HPI: [name] is a 49 y.o. female with a history of CHF with an EF of 15 to 20% who presents with right-sided weakness that started this morning.  She woke up around 6:30 AM, already with some problems with her right side, but she was still able to walk at that time and walked downstairs.  Subsequently around 8:30 AM, she was able to call out for her husband who found her having difficulty with speaking and with complete right-sided paralysis.  EMS was called and a code stroke was activated and she was brought in emergently.  She was taken for CT where there is question of mild left putaminal change, as well as a left M1 with some collateralization and large area of penumbra on CT perfusion.  I discussion with her husband, she does have some symptoms from heart failure, but is able to accomplish all of her own activities of daily living and takes care of herself at baseline.  LKW: 10/25 prior to bed tpa given?: No, outside window IR Thrombectomy?  Yes Modified Rankin Scale: 1-No significant post stroke disability and can perform usual duties with stroke symptoms   ROS: A complete ROS was performed and is negative except as noted in the HPI.  Past Medical History:  Diagnosis Date  . Automatic implantable cardioverter-defibrillator in situ   . Chronic CHF (congestive heart failure) (HCC)    a. EF 15-20% b. RHC (09/2013) RA 14, RV 57/22, PA 64/36 (48), PCWP 18, FIck CO/CI 3.7/1.6, PVR 8.1 WU, PA sat 47%   . History of stomach ulcers   . Hypertension   . Hypotension   . Hypothyroidism   . Morbid obesity ([place])   . Myocardial infarction ([place]) 08/2013  . Nocturnal dyspnea   . Nonischemic cardiomyopathy ([place])   . Sinus tachycardia   . Snoring-prob OSA 09/04/2011  . [employer] ICD lead RECALL  [id number]   . UARS (upper airway resistance syndrome) 09/04/2011   HST 12/2013:  AHI 4/hr (numerous episodes of airflow  reduction that did not have concomitant desaturation)      Family History  Problem Relation Age of Onset  . Heart disease Maternal Grandmother   . Heart disease Mother   . Obesity Mother   . High blood pressure Father   . Stroke Father      Social History:  reports that she has never smoked. She has never used smokeless tobacco. She reports that she does not drink alcohol and does not use drugs.   Prior to Admission medications   Medication Sig Start Date End Date Taking? Authorizing Provider  amiodarone (PACERONE) 200 MG tablet Take 1 tablet (200 mg total) by mouth daily. 03/15/19  Yes Larey Dresser, MD  atorvastatin (LIPITOR) 40 MG tablet TAKE 1 TABLET BY MOUTH ONCE A DAY Patient taking differently: Take 40 mg by mouth daily.  02/06/20  Yes Larey Dresser, MD  carvedilol (COREG) 25 MG tablet TAKE 1 TABLET BY MOUTH TWICE A DAY WITH A MEAL Patient taking differently: Take 25 mg by mouth 2 (two) times daily with a meal.  03/15/19  Yes Larey Dresser, MD  CORLANOR 5 MG TABS tablet TAKE 1 TABLET BY MOUTH TWICE (2) DAILY WITH MEALS Patient taking differently: Take 5 mg by mouth 2 (two) times daily with a meal.  06/14/19  Yes Larey Dresser, MD  dapagliflozin propanediol (FARXIGA) 10 MG TABS tablet Take  10 mg by mouth daily before breakfast. 09/27/19  Yes Larey Dresser, MD  digoxin (LANOXIN) 0.125 MG tablet TAKE 1 TABLET BY MOUTH ONCE A DAY Patient taking differently: Take 0.125 mg by mouth daily.  03/05/20  Yes Larey Dresser, MD  ENTRESTO 49-51 MG TAKE 1 TABLET BY MOUTH TWICE (2) DAILY Patient taking differently: Take 1 tablet by mouth 2 (two) times daily.  02/06/20  Yes Larey Dresser, MD  spironolactone (ALDACTONE) 25 MG tablet TAKE 1 TABLET BY MOUTH EVERY NIGHT AT BEDTIME Patient taking differently: Take 25 mg by mouth at bedtime.  10/06/19  Yes Larey Dresser, MD  torsemide (DEMADEX) 20 MG tablet Take 4 tablets (80 mg total) by mouth 2 (two) times daily for 3 days, THEN 4  tablets (80 mg total) in the morning for 30 days, THEN 3 tablets (60 mg total) every evening. Patient taking differently: Take 4 tablets (80mg ) in the morning and 2 tablets (40mg ) in the evening. 01/30/20 04/17/20 Yes Simmons, Brittainy M, PA-C  levothyroxine (SYNTHROID) 25 MCG tablet Take 1 tablet (25 mcg total) by mouth daily before breakfast. 09/23/18   Deboraha Sprang, MD  metolazone (ZAROXOLYN) 2.5 MG tablet Take 1 tablet (2.5 mg total) by mouth as needed (extra fluid as directed by HF clinic). 02/23/20   Lyda Jester M, PA-C  potassium chloride SA (KLOR-CON) 20 MEQ tablet Take 2 tablets (40 mEq total) by mouth 2 (two) times daily. Patient taking differently: Take 40 mEq by mouth 2 (two) times daily. Taking 2 tab in the morning and 2 tab at night 02/01/20   Lyda Jester M, PA-C  Vitamin D, Ergocalciferol, (DRISDOL) 1.25 MG (50000 UNIT) CAPS capsule Take 1 capsule (50,000 Units total) by mouth every 7 (seven) days. 01/17/20   Abby Potash, PA-C     Exam: Current vital signs: BP 93/75   Pulse 89   Resp 17   Ht 5' 3.5" (1.613 m)   Wt 114.5 kg   SpO2 100%   BMI 44.01 kg/m    Physical Exam  Constitutional: Appears well-developed and well-nourished.  Psych: Affect appropriate to situation Eyes: No scleral injection HENT: No OP obstrucion Head: Normocephalic.  Cardiovascular: Normal rate and regular rhythm.  Respiratory: Effort normal and breath sounds normal to anterior ascultation GI: Soft.  No distension. There is no tenderness.  Skin: WDI  Neuro: Mental Status: Patient is awake, alert, oriented to person, place, month, age Patient is able to give a clear and coherent history. She is very dysarthric, but is able to name simple objects and able to answer simple questions. Cranial Nerves: II: Visual Fields are full. Pupils are equal, round, and reactive to light.   III,IV, VI: EOMI without ptosis or diploplia.  VII: Facial movement with significant right facial  weakness VIII: hearing is intact to voice X: Uvula elevates symmetrically XI: Shoulder shrug is symmetric. XII: tongue is midline without atrophy or fasciculations.  Motor: She has normal strength on the left, complete flaccid paralysis of both right arm and leg, 0/5 sensory: Sensation is diminished on the right side Cerebellar: No clear ataxia on the left   I have reviewed labs in epic and the pertinent results are: Normal sodium, potassium, creatinine 1.28 Mildly anemic at hemoglobin of 11.2  I have reviewed the images obtained: CT- mild putaminal cytotoxic edema, otherwise no CT change yet, CT perfusion with a large penumbra in the setting of an M1 occlusion  Primary Diagnosis:  Cerebral infarction due to embolism  of  left middle cerebral artery.   Secondary Diagnosis: Cerebral edema, Essential (primary) hypertension, Chronic systolic (congestive) heart failure and Morbid Obesity(BMI > 40)  Impression: 49 year old female with acute left MCA occlusion in the setting of CHF.  This is likely cardioembolic due to her decreased EF.  She is a candidate for interventional thrombectomy and therefore she has been taken to the interventional suite to pursue this.  Plan: - HgbA1c, fasting lipid panel - MRI, MRA  of the brain without contrast - Frequent neuro checks - Echocardiogram - Carotid dopplers - Prophylactic therapy-depending on outcome of procedure - Risk factor modification - Telemetry monitoring - PT consult, OT consult, Speech consult - Stroke team to follow  CHF- Continue digoxin, Farxiga, amiodarone I have discussed with the heart failure team who will see her in consultation, would favor holding antihypertensives in the acute phase as possible, can use vasoactive drips to tightly control BP if intervention is successful.   This patient is critically ill and at significant risk of neurological worsening, death and care requires constant monitoring of vital signs,  hemodynamics,respiratory and cardiac monitoring, neurological assessment, discussion with family, other specialists and medical decision making of high complexity. I spent 65 minutes of neurocritical care time  in the care of  this patient. This was time spent independent of any time provided by nurse practitioner or PA.  Roland Rack, MD [facility] [phone]  If 7pm- 7am, please page neurology on call as listed in [facility]. 04/17/2020  11:11 AM

## 2020-04-17 NOTE — ED Triage Notes (Signed)
Pt arrival [facility] as a code stroke LKW 2200 04/19/2019 right sided weakness and AMS. Pt woke up not feeling well and walked down stairs and husband found pt down and called ems.

## 2020-04-17 NOTE — Anesthesia Postprocedure Evaluation (Signed)
Anesthesia Post Note  Patient: [name]  Procedure(s) Performed: Code Stroke (N/A )     Patient location during evaluation: SICU Anesthesia Type: General Level of consciousness: sedated Pain management: pain level controlled Vital Signs Assessment: post-procedure vital signs reviewed and stable Respiratory status: patient remains intubated per anesthesia plan Cardiovascular status: stable Postop Assessment: no apparent nausea or vomiting Anesthetic complications: no   No complications documented.  Last Vitals:  Vitals:   04/17/20 1447 04/17/20 1450  BP:  124/85  Pulse:  74  Resp:  (!) 21  SpO2: 100% 100%    Last Pain:  Vitals:   04/17/20 0955  PainSc: 0-No pain                 Belenda Cruise P Crixus Mcaulay

## 2020-04-17 NOTE — Anesthesia Procedure Notes (Addendum)
Arterial Line Insertion Start/End10/26/2021 10:30 AM, 04/17/2020 10:44 AM Performed by: Lidia Collum, MD, anesthesiologist  Patient location: Pre-op. Preanesthetic checklist: patient identified, IV checked, site marked, risks and benefits discussed, surgical consent, monitors and equipment checked, pre-op evaluation, timeout performed and anesthesia consent Lidocaine 1% used for infiltration Left, radial was placed Catheter size: 20 G Hand hygiene performed  and Seldinger technique used  Attempts: 1 Procedure performed using ultrasound guided technique. Ultrasound Notes:anatomy identified, needle tip was noted to be adjacent to the nerve/plexus identified and no ultrasound evidence of intravascular and/or intraneural injection Following insertion, dressing applied and Biopatch. Post procedure assessment: normal and unchanged  Patient tolerated the procedure well with no immediate complications.

## 2020-04-17 NOTE — Progress Notes (Signed)
[facility] Progress Note Patient Name: ASHRITA CHRISMER DOB: [birth year] MRN: [id number]   Date of Service  04/17/2020  HPI/Events of Note  LVEF = 20-25%.  eICU Interventions  Plan: 1. Will decrease 0.9 NaCl IV infusion to 40 mL/hour.      Intervention Category Major Interventions: Other:  Lysle Dingwall 04/17/2020, 8:57 PM

## 2020-04-17 NOTE — Code Documentation (Addendum)
Stroke Response Nurse Documentation Code Documentation  [name] is a 49 y.o. female arriving to [facility] ED via [facility] on 04/17/20 with past medical hx of EF 20% and ICD in left chest, HTN, CHF, Hypothyroidism. Patient from home where she was LKW at Last night at [place] when she went to bed. Pt woke at 0630 this morning feeling "off." She got dressed and walked downstairs. At 0830, pt had sudden onset of right sided and left gaze. EMS was called and activated a Code Stroke in the field.    Stroke team at the bedside on patient arrival. Labs drawn and patient cleared for CT by EDP Dykstra. Patient to CT with team. NIHSS 14, see documentation for details and code stroke times. Patient with left gaze preference , right facial droop, right arm weakness, right leg weakness, right decreased sensation, dysarthria  and Sensory  neglect on exam. The following imaging was completed: CT, CTA head and neck, CTP. Patient is not a candidate for tPA due to being outside the window. Care/Plan: Pt taken to Interventional [facility]. Admit to ICU. Bedside handoff with IR RN Marylyn Ishihara.    Kathrin Greathouse  Stroke Response RN

## 2020-04-17 NOTE — Sedation Documentation (Signed)
Notified Robin- bed control of active code stroke on table

## 2020-04-17 NOTE — Transfer of Care (Signed)
Immediate Anesthesia Transfer of Care Note  Patient: [name]  Procedure(s) Performed: Code Stroke (N/A )  Patient Location: ICU  Anesthesia Type:General  Level of Consciousness: sedated and Patient remains intubated per anesthesia plan  Airway & Oxygen Therapy: Patient remains intubated per anesthesia plan and Patient placed on Ventilator (see vital sign flow sheet for setting)  Post-op Assessment: Report given to RN and Post -op Vital signs reviewed and stable  Post vital signs: Reviewed and stable  Last Vitals:  Vitals Value Taken Time  BP 104/65 04/17/20 1450  Temp    Pulse 75 04/17/20 1453  Resp 14 04/17/20 1453  SpO2 100 % 04/17/20 1453  Vitals shown include unvalidated device data.  Last Pain:  Vitals:   04/17/20 0955  PainSc: 0-No pain         Complications: No complications documented.

## 2020-04-18 ENCOUNTER — Inpatient Hospital Stay (HOSPITAL_COMMUNITY): Payer: BC Managed Care – PPO

## 2020-04-18 ENCOUNTER — Encounter (HOSPITAL_COMMUNITY): Payer: Self-pay | Admitting: Interventional Radiology

## 2020-04-18 DIAGNOSIS — I6602 Occlusion and stenosis of left middle cerebral artery: Secondary | ICD-10-CM

## 2020-04-18 DIAGNOSIS — I5022 Chronic systolic (congestive) heart failure: Secondary | ICD-10-CM | POA: Diagnosis not present

## 2020-04-18 LAB — CBC WITH DIFFERENTIAL/PLATELET
Abs Immature Granulocytes: 0.09 10*3/uL — ABNORMAL HIGH (ref 0.00–0.07)
Basophils Absolute: 0 10*3/uL (ref 0.0–0.1)
Basophils Relative: 0 %
Eosinophils Absolute: 0 10*3/uL (ref 0.0–0.5)
Eosinophils Relative: 0 %
HCT: 32.5 % — ABNORMAL LOW (ref 36.0–46.0)
Hemoglobin: 9.6 g/dL — ABNORMAL LOW (ref 12.0–15.0)
Immature Granulocytes: 1 %
Lymphocytes Relative: 10 %
Lymphs Abs: 1.6 10*3/uL (ref 0.7–4.0)
MCH: 23.7 pg — ABNORMAL LOW (ref 26.0–34.0)
MCHC: 29.5 g/dL — ABNORMAL LOW (ref 30.0–36.0)
MCV: 80.2 fL (ref 80.0–100.0)
Monocytes Absolute: 1.3 10*3/uL — ABNORMAL HIGH (ref 0.1–1.0)
Monocytes Relative: 8 %
Neutro Abs: 13 10*3/uL — ABNORMAL HIGH (ref 1.7–7.7)
Neutrophils Relative %: 81 %
Platelets: 294 10*3/uL (ref 150–400)
RBC: 4.05 MIL/uL (ref 3.87–5.11)
RDW: 16.8 % — ABNORMAL HIGH (ref 11.5–15.5)
WBC: 16 10*3/uL — ABNORMAL HIGH (ref 4.0–10.5)
nRBC: 0 % (ref 0.0–0.2)

## 2020-04-18 LAB — RENAL FUNCTION PANEL
Albumin: 2.8 g/dL — ABNORMAL LOW (ref 3.5–5.0)
Anion gap: 8 (ref 5–15)
BUN: 9 mg/dL (ref 6–20)
CO2: 19 mmol/L — ABNORMAL LOW (ref 22–32)
Calcium: 8.2 mg/dL — ABNORMAL LOW (ref 8.9–10.3)
Chloride: 115 mmol/L — ABNORMAL HIGH (ref 98–111)
Creatinine, Ser: 0.89 mg/dL (ref 0.44–1.00)
GFR, Estimated: >60 mL/min (ref >60)
Glucose, Bld: 91 mg/dL (ref 70–99)
Phosphorus: 2.8 mg/dL (ref 2.5–4.6)
Potassium: 3.5 mmol/L (ref 3.5–5.1)
Sodium: 142 mmol/L (ref 135–145)

## 2020-04-18 LAB — LIPID PANEL
Cholesterol: 198 mg/dL (ref 0–200)
HDL: 50 mg/dL (ref >40)
LDL Cholesterol: 130 mg/dL — ABNORMAL HIGH (ref 0–99)
Total CHOL/HDL Ratio: 4 RATIO
Triglycerides: 92 mg/dL (ref <150)
VLDL: 18 mg/dL (ref 0–40)

## 2020-04-18 LAB — RAPID URINE DRUG SCREEN, HOSP PERFORMED
Amphetamines: NOT DETECTED
Barbiturates: NOT DETECTED
Benzodiazepines: NOT DETECTED
Cocaine: NOT DETECTED
Opiates: NOT DETECTED
Tetrahydrocannabinol: NOT DETECTED

## 2020-04-18 LAB — MAGNESIUM: Magnesium: 1.6 mg/dL — ABNORMAL LOW (ref 1.7–2.4)

## 2020-04-18 LAB — PLATELET INHIBITION P2Y12: Platelet Function  P2Y12: 48 [PRU] — ABNORMAL LOW (ref 182–335)

## 2020-04-18 MED ORDER — MAGNESIUM SULFATE 2 GM/50ML IV SOLN
2.0000 g | Freq: Once | INTRAVENOUS | Status: AC
  Administered 2020-04-18: 2 g via INTRAVENOUS
  Filled 2020-04-18: qty 50

## 2020-04-18 MED ORDER — ATORVASTATIN CALCIUM 40 MG PO TABS
40.0000 mg | ORAL_TABLET | Freq: Every day | ORAL | Status: DC
  Administered 2020-04-19 – 2020-04-20 (×2): 40 mg via ORAL
  Filled 2020-04-18: qty 4
  Filled 2020-04-18: qty 1

## 2020-04-18 MED ORDER — AMIODARONE HCL 200 MG PO TABS
200.0000 mg | ORAL_TABLET | Freq: Every day | ORAL | Status: DC
  Administered 2020-04-19: 200 mg via ORAL
  Filled 2020-04-18: qty 1

## 2020-04-18 MED ORDER — DIGOXIN 125 MCG PO TABS
0.1250 mg | ORAL_TABLET | Freq: Every day | ORAL | Status: DC
  Administered 2020-04-19 – 2020-04-20 (×2): 0.125 mg via ORAL
  Filled 2020-04-18 (×2): qty 1

## 2020-04-18 MED ORDER — LEVOTHYROXINE SODIUM 25 MCG PO TABS
25.0000 ug | ORAL_TABLET | Freq: Every day | ORAL | Status: DC
  Administered 2020-04-19 – 2020-04-20 (×2): 25 ug via ORAL
  Filled 2020-04-18 (×2): qty 1

## 2020-04-18 MED ORDER — CHLORHEXIDINE GLUCONATE 0.12 % MT SOLN
15.0000 mL | Freq: Two times a day (BID) | OROMUCOSAL | Status: DC
  Administered 2020-04-18 – 2020-04-20 (×4): 15 mL via OROMUCOSAL
  Filled 2020-04-18 (×4): qty 15

## 2020-04-18 MED ORDER — ORAL CARE MOUTH RINSE
15.0000 mL | Freq: Two times a day (BID) | OROMUCOSAL | Status: DC
  Administered 2020-04-18 – 2020-04-20 (×5): 15 mL via OROMUCOSAL

## 2020-04-18 MED ORDER — MAGNESIUM SULFATE 2 GM/50ML IV SOLN
INTRAVENOUS | Status: AC
  Filled 2020-04-18: qty 50

## 2020-04-18 MED ORDER — LEVOTHYROXINE SODIUM 25 MCG PO TABS: 25.0000 ug | ORAL_TABLET | Freq: Every day | ORAL | Status: DC

## 2020-04-18 MED ORDER — SPIRONOLACTONE 25 MG PO TABS
25.0000 mg | ORAL_TABLET | Freq: Every day | ORAL | Status: DC
  Administered 2020-04-18 – 2020-04-20 (×3): 25 mg via ORAL
  Filled 2020-04-18 (×3): qty 1

## 2020-04-18 MED ORDER — ATORVASTATIN CALCIUM 10 MG PO TABS
40.0000 mg | ORAL_TABLET | Freq: Every day | ORAL | Status: DC
  Administered 2020-04-18: 40 mg

## 2020-04-18 MED ORDER — TORSEMIDE 20 MG PO TABS
80.0000 mg | ORAL_TABLET | Freq: Every day | ORAL | Status: DC
  Administered 2020-04-18 – 2020-04-20 (×3): 80 mg via ORAL
  Filled 2020-04-18 (×3): qty 4

## 2020-04-18 NOTE — Progress Notes (Signed)
[name] Progress Note Patient Name: [name] DOB: Sep 28, 1970 MRN: [id number]   Date of Service  04/18/2020  HPI/Events of Note  Agitation - Request for bilateral soft wrist restraints.   eICU Interventions  Will order bilateral soft wrist restraints X 8 hours.      Intervention Category Major Interventions: Delirium, psychosis, severe agitation - evaluation and management  Manas Hickling [place] 04/18/2020, 2:36 AM

## 2020-04-18 NOTE — Progress Notes (Addendum)
STROKE TEAM PROGRESS NOTE   INTERVAL HISTORY  Her husband is at the bedside. Patient is intubated on propofol but following some commands.  Blood pressure adequately controlled.  Vital signs stable.Post-stent CT today shows continued mild hypodensity of left putamen without other loss of grey-white differentiation or hemorrhage with stent at M1/M2 left MCA   Vitals:   04/18/20 0900 04/18/20 1000 04/18/20 1100 04/18/20 1200  BP: (!) 98/57 96/67 108/60 91/70  Pulse: 72 74 73 73  Resp: (!) 21 18 (!) 21 19  Temp:      TempSrc:      SpO2: 100% 100% 100% 100%  Weight:      Height:       CBC:  Recent Labs  Lab 04/17/20 0949 04/17/20 1000 04/17/20 1958 04/18/20 0950  WBC 9.1   < > 14.5* 16.0*  NEUTROABS 6.6  --   --  13.0*  HGB 11.2*   < > 11.2* 9.6*  HCT 38.0   < > 36.6 32.5*  MCV 82.1   < > 79.9* 80.2  PLT 304   < > 421* 294   < > = values in this interval not displayed.   Basic Metabolic Panel:  Recent Labs  Lab 04/17/20 0949 04/17/20 0949 04/17/20 1000 04/17/20 1000 04/17/20 1630 04/17/20 1958 04/18/20 0950  NA 138   < > 140   < > 141  --  142  K 3.8   < > 3.8   < > 4.0  --  3.5  CL 106   < > 107  --   --   --  115*  CO2 22  --   --   --   --   --  19*  GLUCOSE 106*   < > 106*  --   --   --  91  BUN 16   < > 17  --   --   --  9  CREATININE 1.28*   < > 1.20*  --   --   --  0.89  CALCIUM 9.0  --   --   --   --   --  8.2*  MG  --   --   --   --   --  1.7 1.6*  PHOS  --   --   --   --   --   --  2.8   < > = values in this interval not displayed.   Lipid Panel:  Recent Labs  Lab 04/18/20 0950  CHOL 198  TRIG 92  HDL 50  CHOLHDL 4.0  VLDL 18  LDLCALC 130*   HgbA1c: 5.3 12/01/2019 Urine Drug Screen: pending  Alcohol Level No results for input(s): ETH in the last 168 hours.  IMAGING past 24 hours CT HEAD WO CONTRAST  Result Date: 04/18/2020 CLINICAL DATA:  Stroke, follow-up. Intracranial stent placed yesterday. EXAM: CT HEAD WITHOUT CONTRAST TECHNIQUE:  Contiguous axial images were obtained from the base of the skull through the vertex without intravenous contrast. COMPARISON:  Noncontrast head CT performed 04/17/2020 at 6:18 p.m. Noncontrast head CT, CT angiogram head/neck and CT perfusion performed earlier the same day 04/17/2020. FINDINGS: Brain: Previously demonstrated vague hyperdensity within the left basal ganglia, left insula and left frontoparietal cortex is no longer present. Abnormal hypodensity is again demonstrated within the posterior aspect of the left putamen suspicious for acute/early subacute infarction (series 3, image 16). No other definite loss of gray-white differentiation is identified. No evidence of acute intracranial  hemorrhage. No extra-axial fluid collection. No evidence of intracranial mass. No midline shift. Vascular: Redemonstrated vascular stent in the region of the M1/M2 left middle cerebral artery. No hyperdense vessel. Skull: Normal. Negative for fracture or focal lesion. Sinuses/Orbits: Visualized orbits show no acute finding. Moderate-sized left maxillary sinus mucous retention cyst. No significant mastoid effusion. IMPRESSION: Previously demonstrated vague hyperdensity within the left basal ganglia, left insula and left frontoparietal cortex is no longer present and likely reflected contrast staining. Redemonstrated abnormal hypodensity within the posterior aspect of the left putamen suspicious for acute/early subacute infarction. No other definite loss of gray-white differentiation is identified. No acute intracranial hemorrhage. Redemonstrated vascular stent in the region of the M1/M2 left MCA. Electronically Signed   By: Kellie Simmering DO   On: 04/18/2020 10:02   CT HEAD WO CONTRAST  Result Date: 04/17/2020 CLINICAL DATA:  Stroke, follow-up; follow-up. CT post Cangrelor infusion. EXAM: CT HEAD WITHOUT CONTRAST TECHNIQUE: Contiguous axial images were obtained from the base of the skull through the vertex without  intravenous contrast. COMPARISON:  Noncontrast head CT, CT angiogram head/neck and CT perfusion performed earlier the same day 04/17/2020. FINDINGS: Brain: There is subtle hyperdensity within the left basal ganglia as well as within the left insular cortex and left frontoparietal cortex along the left sylvian fissure, suspected to reflect contrast staining at site of acute infarction. Gray-white differentiation is otherwise maintained. No frank parenchymal hemorrhage. No evidence of intracranial mass. No significant mass effect.  No midline shift. Vascular: A vascular stent is now present in the region of the M1/M2 left middle cerebral artery. Skull: Normal. Negative for fracture or focal lesion. Sinuses/Orbits: Visualized orbits show no acute finding. Mucous retention cyst within the left maxillary sinus. Trace scattered paranasal sinus mucosal thickening elsewhere. No significant mastoid effusion. Other: Partially visualized support tubes. IMPRESSION: Subtle hyperdensity is present within the left basal ganglia, as well as within the left insular cortex and left frontal parietal cortex along the left sylvian fissure. Findings are suspected to reflect contrast staining at site of acute infarction. Trace adjacent contrast extravasation/subarachnoid hemorrhage is difficult to exclude. Gray-white differentiation is otherwise maintained. No frank parenchymal hemorrhage. A vascular stent is now present in the region of the M1/M2 left middle cerebral artery. Electronically Signed   By: Kellie Simmering DO   On: 04/17/2020 18:51   DG CHEST PORT 1 VIEW  Result Date: 04/18/2020 CLINICAL DATA:  Respiratory failure, ventilatory support EXAM: PORTABLE CHEST 1 VIEW COMPARISON:  04/17/2020 FINDINGS: Endotracheal tube 4 cm above the carina. Left subclavian pacemaker/defibrillator noted. NG tube enters the stomach with the tip not visualized. Stable cardiomegaly with prominent central vascularity. Dense left lower lobe  collapse/consolidation, slightly worse. Difficult to exclude developing left effusion. No significant pneumothorax. IMPRESSION: Increased left lower lobe retrocardiac collapse/consolidation. Stable cardiomegaly and vascular congestion Electronically Signed   By: Jerilynn Mages.  Shick M.D.   On: 04/18/2020 09:41   DG CHEST PORT 1 VIEW  Result Date: 04/17/2020 CLINICAL DATA:  Recent CVA.  Hypoxia. EXAM: PORTABLE CHEST 1 VIEW COMPARISON:  September 27, 2018 FINDINGS: Endotracheal tube tip is 3.8 cm above the carina. Nasogastric tube tip and side port are below the diaphragm. Pacemaker present with lead attached to the right ventricle, stable. There is ill-defined opacity in the left perihilar region. Lungs elsewhere clear. There is stable cardiomegaly with pulmonary vascularity normal. No adenopathy. No bone lesions. IMPRESSION: Tube positions as described without pneumothorax. Ill-defined opacity left perihilar region consistent with developing pneumonia or possibly aspiration. Lungs  elsewhere clear. Stable cardiomegaly. Stable pacemaker lead positioning. Electronically Signed   By: Lowella Grip III M.D.   On: 04/17/2020 15:36   ECHOCARDIOGRAM COMPLETE  Result Date: 04/17/2020    ECHOCARDIOGRAM REPORT   Patient Name:   [name] Date of Exam: 04/17/2020 Medical Rec #:  [id number]    Height:       63.5 in Accession #:    [id number]   Weight:       252.4 lb Date of Birth:  10/02/1970    BSA:          2.147 m Patient Age:    11 years     BP:           133/83 mmHg Patient Gender: F            HR:           66 bpm. Exam Location:  Inpatient Procedure: 2D Echo, Color Doppler and Cardiac Doppler Indications:    Stroke i163.9  History:        Patient has prior history of Echocardiogram examinations, most                 recent 09/27/2019. CHF, Defibrillator, Pulmonary HTN; Risk                 Factors:Hypertension and Dyslipidemia.  Sonographer:    Raquel Sarna Senior RDCS Referring Phys: Freeland KIRKPATRICK  Sonographer  Comments: Scanned supine on artificial respirator IMPRESSIONS  1. No evidence of left ventricular thrombus. Left ventricular ejection fraction, by estimation, is 20 to 25%. The left ventricle has severely decreased function. The left ventricle demonstrates global hypokinesis. The left ventricular internal cavity size was severely dilated. Left ventricular diastolic parameters are consistent with Grade III diastolic dysfunction (restrictive).  2. Right ventricular systolic function is mildly reduced. The right ventricular size is normal. There is mildly elevated pulmonary artery systolic pressure. The estimated right ventricular systolic pressure is 64.4 mmHg.  3. Left atrial size was severely dilated.  4. A small pericardial effusion is present. The pericardial effusion is circumferential. There is no evidence of cardiac tamponade.  5. The mitral valve is normal in structure. Mild to moderate mitral valve regurgitation.  6. The aortic valve is normal in structure. Aortic valve regurgitation is not visualized.  7. The inferior vena cava is dilated in size with <50% respiratory variability, suggesting right atrial pressure of 15 mmHg. FINDINGS  Left Ventricle: No evidence of left ventricular thrombus. Left ventricular ejection fraction, by estimation, is 20 to 25%. The left ventricle has severely decreased function. The left ventricle demonstrates global hypokinesis. The left ventricular internal cavity size was severely dilated. There is no left ventricular hypertrophy. Left ventricular diastolic parameters are consistent with Grade III diastolic dysfunction (restrictive). Right Ventricle: The right ventricular size is normal. No increase in right ventricular wall thickness. Right ventricular systolic function is mildly reduced. There is mildly elevated pulmonary artery systolic pressure. The tricuspid regurgitant velocity  is 2.40 m/s, and with an assumed right atrial pressure of 15 mmHg, the estimated right  ventricular systolic pressure is 03.4 mmHg. Left Atrium: Left atrial size was severely dilated. Right Atrium: Right atrial size was normal in size. Pericardium: A small pericardial effusion is present. The pericardial effusion is circumferential. There is no evidence of cardiac tamponade. Mitral Valve: The mitral valve is normal in structure. Mild to moderate mitral valve regurgitation, with posteriorly-directed jet. Tricuspid Valve: The tricuspid valve is normal in structure. Tricuspid  valve regurgitation is trivial. Aortic Valve: The aortic valve is normal in structure. Aortic valve regurgitation is not visualized. Pulmonic Valve: The pulmonic valve was grossly normal. Pulmonic valve regurgitation is not visualized. Aorta: The aortic root and ascending aorta are structurally normal, with no evidence of dilitation. Venous: The inferior vena cava is dilated in size with less than 50% respiratory variability, suggesting right atrial pressure of 15 mmHg. IAS/Shunts: No atrial level shunt detected by color flow Doppler. Additional Comments: A pacer wire is visualized.  LEFT VENTRICLE PLAX 2D LVIDd:         7.60 cm LVIDs:         6.80 cm LV PW:         0.90 cm LV IVS:        0.80 cm LVOT diam:     1.90 cm LV SV:         31 LV SV Index:   15 LVOT Area:     2.84 cm  LV Volumes (MOD) LV vol d, MOD A2C: 203.0 ml LV vol d, MOD A4C: 234.0 ml LV vol s, MOD A2C: 159.0 ml LV vol s, MOD A4C: 167.0 ml LV SV MOD A2C:     44.0 ml LV SV MOD A4C:     234.0 ml LV SV MOD BP:      57.4 ml RIGHT VENTRICLE RV S prime:     9.81 cm/s TAPSE (M-mode): 2.0 cm LEFT ATRIUM              Index       RIGHT ATRIUM           Index LA diam:        5.60 cm  2.61 cm/m  RA Area:     21.10 cm LA Vol (A2C):   91.9 ml  42.80 ml/m RA Volume:   57.10 ml  26.59 ml/m LA Vol (A4C):   126.0 ml 58.68 ml/m LA Biplane Vol: 111.0 ml 51.69 ml/m  AORTIC VALVE LVOT Vmax:   65.27 cm/s LVOT Vmean:  49.800 cm/s LVOT VTI:    0.111 m  AORTA Ao Root diam: 2.70 cm Ao Asc  diam:  2.90 cm MR Peak grad:    65.0 mmHg   TRICUSPID VALVE MR Mean grad:    43.0 mmHg   TR Peak grad:   23.0 mmHg MR Vmax:         403.00 cm/s TR Vmax:        240.00 cm/s MR Vmean:        307.0 cm/s MR PISA:         1.57 cm    SHUNTS MR PISA Eff ROA: 12 mm      Systemic VTI:  0.11 m MR PISA Radius:  0.50 cm     Systemic Diam: 1.90 cm Mihai Croitoru MD Electronically signed by Sanda Klein MD Signature Date/Time: 04/17/2020/6:01:10 PM    Final     PHYSICAL EXAM  Constitution: intubated, awake but drowsy Eyes: EOM intact, PERRLA Cardio: regular rate & rhythm Respiratory: intubated on pressure support only, non-labored breathing Neuro: Mental Status: Patient is awake, unable to answer questions as she is intubated. Able to follow most commands, drowsy on sedation and answer some yes or no questions she is intubated. Cranial Nerves: II: unable to assess completely, blinks less to threat on the right than on the left side III,IV, VI: EOMI without ptosis or diploplia. Pupils equal, round and reactive to light V: unable to assess  VII: unable to assess VIII: hearing is intact to voice X: unable to assess XI: Shoulder shrug is symmetric. XII: unable to assess Motor: Strength testing partially limited by sedation and intubation  RLE: strength 2/5 RUE: moves fingers unable to follow command completely LLE: strength 3/5 LUE: moves fingers, unable to follow command completely  Sensory: Unable to assess. Cerebellar: Unable to assess Skin: right femoral site c/d/i   ASSESSMENT/PLAN [name] is a 49 y.o. 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   CT perfusion with 64 cc delay in left MCA territory and 0cc CBF and collateral flow to left MCA branches. Patient went for intervention with thrombectomy and stent placement of left M1 occlusion.     Post-stent CT today shows continued mild hypodensity of left putamen without other loss of grey-white differentiation or hemorrhage with stent at M1/M2 left MCA  Goal bp systolic 500-938  Unable to obtain MRI/MRA due to ICD   2D Echo without thrombus, EF 20-25% and global hypokinesis, severely dilated LV w/grade III diastolic dysfunction. RVP 38 mmHg, decreased IVC respiratory variability, EF similar to prior 09/27/19  Discussed with cardiology and will interrogate ICD   LDL 127  HgbA1c 5.3  VTE prophylaxis - lovenox  PT/OT ordered    Diet   Diet NPO time specified    Therapy recommendations:  pending  Disposition:  Remain in ICU overnight, patient extubated this morning  With EF 20-25% continue brilinta 90 mg bid and asa 81 mg, no anticoagulation indicated at this time unless atrial fibrillation present on ICD. No history of afib.   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 Currently holding BB and entresto with plans to restart diuretics today.   BP Stable - goal systolic 182-993   Hyperlipidemia  Home meds:  Atorvastatin 40 mg, resumed in hospital   LDL 127, goal < 70  Continue statin at discharge  Other Stroke Risk Factors  Substance abuse - UDS: pending  Obesity, Body mass index is 44.01 kg/m., recommend weight loss, diet and exercise as appropriate   Family  hx stroke (Father)  Coronary artery disease, MI  Obstructive sleep apnea, recently diagnosed - on CPAP at home  Systolic ongestive heart failure  Other Active Problems  Hypothyroidism - on levothyroxine   Hospital day # 1 I have personally obtained history,examined this patient, reviewed notes, independently viewed imaging studies, participated in medical decision making and plan of care.ROS completed by me personally and pertinent positives fully documented  I have made any additions or clarifications directly to the above note. Agree with note above.  She presented with left MCA infarct with a left M1 occlusion and underwent successful mechanical thrombectomy.  Follow-up CT scan did not show any significant hemorrhage or large stroke.  Recommend extubate as  tolerated.  Strict blood pressure control with systolic 979-150 as per post intervention protocol.  Repeat CT scan of the head as patient may not be able to have MRI due to an ICD.  Cardiology to interrogate ICD to look for A. fib.  Aggressive risk factor modification.  Discussed with Dr. Estanislado Pandy and Dr. Ander Slade critical care medicine This patient is critically ill and at significant risk of neurological worsening, death and care requires constant monitoring of vital signs, hemodynamics,respiratory and cardiac monitoring, extensive review of multiple databases, frequent neurological assessment, discussion with family, other specialists and medical decision making of high complexity.I have made any additions or clarifications directly to the above note.This critical care time does not reflect procedure time, or teaching time or supervisory time of PA/NP/Med Resident etc but could involve care discussion time.  I spent 30 minutes of neurocritical care time  in the care of  this patient.    Antony Contras, MD Medical Director [facility] Pager: [phone] 04/18/2020 4:17 PM   To contact Stroke Continuity provider, please  refer to [email or web address] After hours, contact General Neurology

## 2020-04-18 NOTE — Progress Notes (Signed)
Rehab Admissions Coordinator Note:  Patient was screened by Cleatrice Burke for appropriateness for an Inpatient Acute Rehab Consult per therapy recommendations. .  At this time, we are recommending Inpatient Rehab consult. I will place order per protocol.  Cleatrice Burke RN MSN 04/18/2020, 5:14 PM  I can be reached at [phone].

## 2020-04-18 NOTE — Progress Notes (Signed)
RT assisted with transport of pt while on full support from 4N32 to CT. Pt tolerated well along with SVS.

## 2020-04-18 NOTE — Progress Notes (Addendum)
Advanced Heart Failure Rounding Note  PCP-Cardiologist: [name], MD   Subjective:   Overnight started on Neo to support BP.  Awake on vent.    Objective:   Weight Range: 114.5 kg Body mass index is 44.01 kg/m.   Vital Signs:   Temp:  [98.5 F (36.9 C)-98.7 F (37.1 C)] 98.7 F (37.1 C) (10/27 0400) Pulse Rate:  [30-99] 70 (10/27 0822) Resp:  [15-26] 17 (10/27 0822) BP: (84-139)/(45-108) 136/80 (10/27 0822) SpO2:  [94 %-100 %] 100 % (10/27 9150) Arterial Line BP: (116-166)/(64-96) 136/81 (10/27 0800) FiO2 (%):  [40 %-50 %] 40 % (10/27 0822) Weight:  [114.5 kg] 114.5 kg (10/26 0900) Last BM Date:  (PTA)  Weight change: Filed Weights   04/17/20 0900  Weight: 114.5 kg    Intake/Output:   Intake/Output Summary (Last 24 hours) at 04/18/2020 0847 Last data filed at 04/18/2020 0800 Gross per 24 hour  Intake 4838.21 ml  Output 1530 ml  Net 3308.21 ml      Physical Exam    General:  Awake on vent  HEENT: ETT  Neck: Supple. JVP 5-6 . Carotids 2+ bilat; no bruits. No lymphadenopathy or thyromegaly appreciated. Cor: PMI nondisplaced. Regular rate & rhythm. No rubs, gallops or murmurs. Lungs: Clear Abdomen: Soft, nontender, nondistended. No hepatosplenomegaly. No bruits or masses. Good bowel sounds. Extremities: No cyanosis, clubbing, rash, edema Neuro: Awake on vent    Telemetry   SR with occasional PVCs  EKG   n/a  Labs    CBC Recent Labs    04/17/20 0949 04/17/20 1000 04/17/20 1630 04/17/20 1958  WBC 9.1  --   --  14.5*  NEUTROABS 6.6  --   --   --   HGB 11.2*   < > 11.9* 11.2*  HCT 38.0   < > 35.0* 36.6  MCV 82.1  --   --  79.9*  PLT 304  --   --  421*   < > = values in this interval not displayed.   Basic Metabolic Panel Recent Labs    04/17/20 0949 04/17/20 0949 04/17/20 1000 04/17/20 1630 04/17/20 1958  NA 138   < > 140 141  --   K 3.8   < > 3.8 4.0  --   CL 106  --  107  --   --   CO2 22  --   --   --   --   GLUCOSE  106*  --  106*  --   --   BUN 16  --  17  --   --   CREATININE 1.28*  --  1.20*  --   --   CALCIUM 9.0  --   --   --   --   MG  --   --   --   --  1.7   < > = values in this interval not displayed.   Liver Function Tests Recent Labs    04/17/20 0949  AST 18  ALT 14  ALKPHOS 54  BILITOT 0.6  PROT 6.9  ALBUMIN 3.4*   No results for input(s): LIPASE, AMYLASE in the last 72 hours. Cardiac Enzymes No results for input(s): CKTOTAL, CKMB, CKMBINDEX, TROPONINI in the last 72 hours.  BNP: BNP (last 3 results) Recent Labs    01/26/20 1030  BNP 929.8*    ProBNP (last 3 results) No results for input(s): PROBNP in the last 8760 hours.   D-Dimer No results for input(s): DDIMER in  the last 72 hours. Hemoglobin A1C No results for input(s): HGBA1C in the last 72 hours. Fasting Lipid Panel Recent Labs    04/17/20 1958  TRIG 97   Thyroid Function Tests No results for input(s): TSH, T4TOTAL, T3FREE, THYROIDAB in the last 72 hours.  Invalid input(s): FREET3  Other results:   Imaging    CT Code Stroke CTA Head W/WO contrast  Result Date: 04/17/2020 CLINICAL DATA:  Right-sided weakness. EXAM: CT ANGIOGRAPHY HEAD AND NECK CT PERFUSION BRAIN TECHNIQUE: Multidetector CT imaging of the head and neck was performed using the standard protocol during bolus administration of intravenous contrast. Multiplanar CT image reconstructions and MIPs were obtained to evaluate the vascular anatomy. Carotid stenosis measurements (when applicable) are obtained utilizing NASCET criteria, using the distal internal carotid diameter as the denominator. Multiphase CT imaging of the brain was performed following IV bolus contrast injection. Subsequent parametric perfusion maps were calculated using RAPID software. CONTRAST:  12m OMNIPAQUE IOHEXOL 350 MG/ML SOLN COMPARISON:  CT head 04/17/2020 FINDINGS: CTA NECK FINDINGS Aortic arch: On the initial scan, there is no arterial enhancement due to early  trigger of the machine. Patient was rescanned immediately after the initial scan with satisfactory opacification of the intracranial arterial system. The neck was not re-scanned to evaluate the carotid and vertebral arteries in the neck. Right carotid system: Not evaluated due to lack of arterial contrast. Left carotid system: Not evaluated due to lack of arterial contrast. Vertebral arteries: Not evaluated. Skeleton: No acute abnormality. Other neck: Question left thyroidectomy. Right thyroid mildly enlarged without focal abnormality. No adenopathy in the neck. Upper chest: Mild hilar adenopathy bilaterally.  Lung apices clear. Review of the MIP images confirms the above findings CTA HEAD FINDINGS Anterior circulation: Internal carotid artery widely patent through the cavernous segment bilaterally. Occlusion left M1 segment proximally. There is collateral circulation with good opacification of left M2 and M3 branches. Both anterior cerebral arteries are patent without stenosis. Right middle cerebral artery widely patent without stenosis. Posterior circulation: Both vertebral arteries patent to the basilar. PICA patent bilaterally. Basilar widely patent. Superior cerebellar and posterior cerebral arteries patent bilaterally without stenosis or large vessel occlusion. Venous sinuses: Normal venous enhancement. Anatomic variants: None Review of the MIP images confirms the above findings CT Brain Perfusion Findings: ASPECTS: 9 CBF (<30%) Volume: 0ML Perfusion (Tmax>6.0s) volume: 670mMismatch Volume: 6499mnfarction Location:Left MCA territory involving the frontal and parietal lobe. IMPRESSION: 1. CT perfusion positive for 64 mL of delayed perfusion in the left MCA territory. No fixed infarct on perfusion. 2. Occlusion proximal left M1 segment with good distal collateral flow to the left MCA branches. 3. No other intracranial stenosis 4. Carotid vertebral arteries in the neck were not evaluated due to lack of arterial  opacification. The scan was performed early in the pulmonary arterial phase and subsequently immediately repeated with adequate intracranial arterial enhancement. 5. Images were reviewed with Dr. KirLeonel Ramsayllowing completion of the study Electronically Signed   By: ChaFranchot GalloD.   On: 04/17/2020 10:28   CT HEAD WO CONTRAST  Result Date: 04/17/2020 CLINICAL DATA:  Stroke, follow-up; follow-up. CT post Cangrelor infusion. EXAM: CT HEAD WITHOUT CONTRAST TECHNIQUE: Contiguous axial images were obtained from the base of the skull through the vertex without intravenous contrast. COMPARISON:  Noncontrast head CT, CT angiogram head/neck and CT perfusion performed earlier the same day 04/17/2020. FINDINGS: Brain: There is subtle hyperdensity within the left basal ganglia as well as within the left insular cortex and left  frontoparietal cortex along the left sylvian fissure, suspected to reflect contrast staining at site of acute infarction. Gray-white differentiation is otherwise maintained. No frank parenchymal hemorrhage. No evidence of intracranial mass. No significant mass effect.  No midline shift. Vascular: A vascular stent is now present in the region of the M1/M2 left middle cerebral artery. Skull: Normal. Negative for fracture or focal lesion. Sinuses/Orbits: Visualized orbits show no acute finding. Mucous retention cyst within the left maxillary sinus. Trace scattered paranasal sinus mucosal thickening elsewhere. No significant mastoid effusion. Other: Partially visualized support tubes. IMPRESSION: Subtle hyperdensity is present within the left basal ganglia, as well as within the left insular cortex and left frontal parietal cortex along the left sylvian fissure. Findings are suspected to reflect contrast staining at site of acute infarction. Trace adjacent contrast extravasation/subarachnoid hemorrhage is difficult to exclude. Gray-white differentiation is otherwise maintained. No frank  parenchymal hemorrhage. A vascular stent is now present in the region of the M1/M2 left middle cerebral artery. Electronically Signed   By: Kellie Simmering DO   On: 04/17/2020 18:51   CT Code Stroke CTA Neck W/WO contrast  Result Date: 04/17/2020 CLINICAL DATA:  Right-sided weakness. EXAM: CT ANGIOGRAPHY HEAD AND NECK CT PERFUSION BRAIN TECHNIQUE: Multidetector CT imaging of the head and neck was performed using the standard protocol during bolus administration of intravenous contrast. Multiplanar CT image reconstructions and MIPs were obtained to evaluate the vascular anatomy. Carotid stenosis measurements (when applicable) are obtained utilizing NASCET criteria, using the distal internal carotid diameter as the denominator. Multiphase CT imaging of the brain was performed following IV bolus contrast injection. Subsequent parametric perfusion maps were calculated using RAPID software. CONTRAST:  181m OMNIPAQUE IOHEXOL 350 MG/ML SOLN COMPARISON:  CT head 04/17/2020 FINDINGS: CTA NECK FINDINGS Aortic arch: On the initial scan, there is no arterial enhancement due to early trigger of the machine. Patient was rescanned immediately after the initial scan with satisfactory opacification of the intracranial arterial system. The neck was not re-scanned to evaluate the carotid and vertebral arteries in the neck. Right carotid system: Not evaluated due to lack of arterial contrast. Left carotid system: Not evaluated due to lack of arterial contrast. Vertebral arteries: Not evaluated. Skeleton: No acute abnormality. Other neck: Question left thyroidectomy. Right thyroid mildly enlarged without focal abnormality. No adenopathy in the neck. Upper chest: Mild hilar adenopathy bilaterally.  Lung apices clear. Review of the MIP images confirms the above findings CTA HEAD FINDINGS Anterior circulation: Internal carotid artery widely patent through the cavernous segment bilaterally. Occlusion left M1 segment proximally. There is  collateral circulation with good opacification of left M2 and M3 branches. Both anterior cerebral arteries are patent without stenosis. Right middle cerebral artery widely patent without stenosis. Posterior circulation: Both vertebral arteries patent to the basilar. PICA patent bilaterally. Basilar widely patent. Superior cerebellar and posterior cerebral arteries patent bilaterally without stenosis or large vessel occlusion. Venous sinuses: Normal venous enhancement. Anatomic variants: None Review of the MIP images confirms the above findings CT Brain Perfusion Findings: ASPECTS: 9 CBF (<30%) Volume: 0ML Perfusion (Tmax>6.0s) volume: 661mMismatch Volume: 6479mnfarction Location:Left MCA territory involving the frontal and parietal lobe. IMPRESSION: 1. CT perfusion positive for 64 mL of delayed perfusion in the left MCA territory. No fixed infarct on perfusion. 2. Occlusion proximal left M1 segment with good distal collateral flow to the left MCA branches. 3. No other intracranial stenosis 4. Carotid vertebral arteries in the neck were not evaluated due to lack of arterial opacification. The  scan was performed early in the pulmonary arterial phase and subsequently immediately repeated with adequate intracranial arterial enhancement. 5. Images were reviewed with Dr. Leonel Ramsay following completion of the study Electronically Signed   By: Franchot Gallo M.D.   On: 04/17/2020 10:28   CT Code Stroke Cerebral Perfusion with contrast  Result Date: 04/17/2020 CLINICAL DATA:  Right-sided weakness. EXAM: CT ANGIOGRAPHY HEAD AND NECK CT PERFUSION BRAIN TECHNIQUE: Multidetector CT imaging of the head and neck was performed using the standard protocol during bolus administration of intravenous contrast. Multiplanar CT image reconstructions and MIPs were obtained to evaluate the vascular anatomy. Carotid stenosis measurements (when applicable) are obtained utilizing NASCET criteria, using the distal internal carotid  diameter as the denominator. Multiphase CT imaging of the brain was performed following IV bolus contrast injection. Subsequent parametric perfusion maps were calculated using RAPID software. CONTRAST:  139m OMNIPAQUE IOHEXOL 350 MG/ML SOLN COMPARISON:  CT head 04/17/2020 FINDINGS: CTA NECK FINDINGS Aortic arch: On the initial scan, there is no arterial enhancement due to early trigger of the machine. Patient was rescanned immediately after the initial scan with satisfactory opacification of the intracranial arterial system. The neck was not re-scanned to evaluate the carotid and vertebral arteries in the neck. Right carotid system: Not evaluated due to lack of arterial contrast. Left carotid system: Not evaluated due to lack of arterial contrast. Vertebral arteries: Not evaluated. Skeleton: No acute abnormality. Other neck: Question left thyroidectomy. Right thyroid mildly enlarged without focal abnormality. No adenopathy in the neck. Upper chest: Mild hilar adenopathy bilaterally.  Lung apices clear. Review of the MIP images confirms the above findings CTA HEAD FINDINGS Anterior circulation: Internal carotid artery widely patent through the cavernous segment bilaterally. Occlusion left M1 segment proximally. There is collateral circulation with good opacification of left M2 and M3 branches. Both anterior cerebral arteries are patent without stenosis. Right middle cerebral artery widely patent without stenosis. Posterior circulation: Both vertebral arteries patent to the basilar. PICA patent bilaterally. Basilar widely patent. Superior cerebellar and posterior cerebral arteries patent bilaterally without stenosis or large vessel occlusion. Venous sinuses: Normal venous enhancement. Anatomic variants: None Review of the MIP images confirms the above findings CT Brain Perfusion Findings: ASPECTS: 9 CBF (<30%) Volume: 0ML Perfusion (Tmax>6.0s) volume: 684mMismatch Volume: 6445mnfarction Location:Left MCA territory  involving the frontal and parietal lobe. IMPRESSION: 1. CT perfusion positive for 64 mL of delayed perfusion in the left MCA territory. No fixed infarct on perfusion. 2. Occlusion proximal left M1 segment with good distal collateral flow to the left MCA branches. 3. No other intracranial stenosis 4. Carotid vertebral arteries in the neck were not evaluated due to lack of arterial opacification. The scan was performed early in the pulmonary arterial phase and subsequently immediately repeated with adequate intracranial arterial enhancement. 5. Images were reviewed with Dr. KirLeonel Ramsayllowing completion of the study Electronically Signed   By: ChaFranchot GalloD.   On: 04/17/2020 10:28   DG CHEST PORT 1 VIEW  Result Date: 04/17/2020 CLINICAL DATA:  Recent CVA.  Hypoxia. EXAM: PORTABLE CHEST 1 VIEW COMPARISON:  September 27, 2018 FINDINGS: Endotracheal tube tip is 3.8 cm above the carina. Nasogastric tube tip and side port are below the diaphragm. Pacemaker present with lead attached to the right ventricle, stable. There is ill-defined opacity in the left perihilar region. Lungs elsewhere clear. There is stable cardiomegaly with pulmonary vascularity normal. No adenopathy. No bone lesions. IMPRESSION: Tube positions as described without pneumothorax. Ill-defined opacity left perihilar region  consistent with developing pneumonia or possibly aspiration. Lungs elsewhere clear. Stable cardiomegaly. Stable pacemaker lead positioning. Electronically Signed   By: Lowella Grip III M.D.   On: 04/17/2020 15:36   ECHOCARDIOGRAM COMPLETE  Result Date: 04/17/2020    ECHOCARDIOGRAM REPORT   Patient Name:   VOLLIE AARON Date of Exam: 04/17/2020 Medical Rec #:  [id number]    Height:       63.5 in Accession #:    [id number]   Weight:       252.4 lb Date of Birth:  [birth year]    BSA:          2.147 m Patient Age:    5 years     BP:           133/83 mmHg Patient Gender: F            HR:           66 bpm. Exam Location:   Inpatient Procedure: 2D Echo, Color Doppler and Cardiac Doppler Indications:    Stroke i163.9  History:        Patient has prior history of Echocardiogram examinations, most                 recent 09/27/2019. CHF, Defibrillator, Pulmonary HTN; Risk                 Factors:Hypertension and Dyslipidemia.  Sonographer:    Raquel Sarna Senior RDCS Referring Phys: [place] KIRKPATRICK  Sonographer Comments: Scanned supine on artificial respirator IMPRESSIONS  1. No evidence of left ventricular thrombus. Left ventricular ejection fraction, by estimation, is 20 to 25%. The left ventricle has severely decreased function. The left ventricle demonstrates global hypokinesis. The left ventricular internal cavity size was severely dilated. Left ventricular diastolic parameters are consistent with Grade III diastolic dysfunction (restrictive).  2. Right ventricular systolic function is mildly reduced. The right ventricular size is normal. There is mildly elevated pulmonary artery systolic pressure. The estimated right ventricular systolic pressure is 85.4 mmHg.  3. Left atrial size was severely dilated.  4. A small pericardial effusion is present. The pericardial effusion is circumferential. There is no evidence of cardiac tamponade.  5. The mitral valve is normal in structure. Mild to moderate mitral valve regurgitation.  6. The aortic valve is normal in structure. Aortic valve regurgitation is not visualized.  7. The inferior vena cava is dilated in size with <50% respiratory variability, suggesting right atrial pressure of 15 mmHg. FINDINGS  Left Ventricle: No evidence of left ventricular thrombus. Left ventricular ejection fraction, by estimation, is 20 to 25%. The left ventricle has severely decreased function. The left ventricle demonstrates global hypokinesis. The left ventricular internal cavity size was severely dilated. There is no left ventricular hypertrophy. Left ventricular diastolic parameters are consistent with  Grade III diastolic dysfunction (restrictive). Right Ventricle: The right ventricular size is normal. No increase in right ventricular wall thickness. Right ventricular systolic function is mildly reduced. There is mildly elevated pulmonary artery systolic pressure. The tricuspid regurgitant velocity  is 2.40 m/s, and with an assumed right atrial pressure of 15 mmHg, the estimated right ventricular systolic pressure is 62.7 mmHg. Left Atrium: Left atrial size was severely dilated. Right Atrium: Right atrial size was normal in size. Pericardium: A small pericardial effusion is present. The pericardial effusion is circumferential. There is no evidence of cardiac tamponade. Mitral Valve: The mitral valve is normal in structure. Mild to moderate mitral valve regurgitation, with posteriorly-directed jet. Tricuspid Valve:  The tricuspid valve is normal in structure. Tricuspid valve regurgitation is trivial. Aortic Valve: The aortic valve is normal in structure. Aortic valve regurgitation is not visualized. Pulmonic Valve: The pulmonic valve was grossly normal. Pulmonic valve regurgitation is not visualized. Aorta: The aortic root and ascending aorta are structurally normal, with no evidence of dilitation. Venous: The inferior vena cava is dilated in size with less than 50% respiratory variability, suggesting right atrial pressure of 15 mmHg. IAS/Shunts: No atrial level shunt detected by color flow Doppler. Additional Comments: A pacer wire is visualized.  LEFT VENTRICLE PLAX 2D LVIDd:         7.60 cm LVIDs:         6.80 cm LV PW:         0.90 cm LV IVS:        0.80 cm LVOT diam:     1.90 cm LV SV:         31 LV SV Index:   15 LVOT Area:     2.84 cm  LV Volumes (MOD) LV vol d, MOD A2C: 203.0 ml LV vol d, MOD A4C: 234.0 ml LV vol s, MOD A2C: 159.0 ml LV vol s, MOD A4C: 167.0 ml LV SV MOD A2C:     44.0 ml LV SV MOD A4C:     234.0 ml LV SV MOD BP:      57.4 ml RIGHT VENTRICLE RV S prime:     9.81 cm/s TAPSE (M-mode): 2.0 cm  LEFT ATRIUM              Index       RIGHT ATRIUM           Index LA diam:        5.60 cm  2.61 cm/m  RA Area:     21.10 cm LA Vol (A2C):   91.9 ml  42.80 ml/m RA Volume:   57.10 ml  26.59 ml/m LA Vol (A4C):   126.0 ml 58.68 ml/m LA Biplane Vol: 111.0 ml 51.69 ml/m  AORTIC VALVE LVOT Vmax:   65.27 cm/s LVOT Vmean:  49.800 cm/s LVOT VTI:    0.111 m  AORTA Ao Root diam: 2.70 cm Ao Asc diam:  2.90 cm MR Peak grad:    65.0 mmHg   TRICUSPID VALVE MR Mean grad:    43.0 mmHg   TR Peak grad:   23.0 mmHg MR Vmax:         403.00 cm/s TR Vmax:        240.00 cm/s MR Vmean:        307.0 cm/s MR PISA:         1.57 cm    SHUNTS MR PISA Eff ROA: 12 mm      Systemic VTI:  0.11 m MR PISA Radius:  0.50 cm     Systemic Diam: 1.90 cm Dani Gobble Croitoru MD Electronically signed by Sanda Klein MD Signature Date/Time: 04/17/2020/6:01:10 PM    Final    CT HEAD CODE STROKE WO CONTRAST  Result Date: 04/17/2020 CLINICAL DATA:  Code stroke. Neuro deficit, acute stroke suspected. Right-sided weakness. EXAM: CT HEAD WITHOUT CONTRAST TECHNIQUE: Contiguous axial images were obtained from the base of the skull through the vertex without intravenous contrast. COMPARISON:  None. FINDINGS: Brain: Ill-defined hypodensity of the left putamen (for example see series 3, image 16). No evidence of acute hemorrhage, hydrocephalus, extra-axial collection or mass lesion/mass effect. Vascular: No definite hyperdense vessel. Skull: No acute fracture. Sinuses/Orbits: Left maxillary  sinus retention cyst versus polyp. Unremarkable orbits. Other: No mastoid effusions. ASPECTS [facility] Stroke Program Early CT Score) - Ganglionic level infarction (caudate, lentiform nuclei, internal capsule, insula, M1-M3 cortex): 6 - Supraganglionic infarction (M4-M6 cortex): 3 Total score (0-10 with 10 being normal): 9 IMPRESSION: 1. Subtle hypodensity involving the left putamen, which may represent acute left MCA territory infarct. ASPECTS is 9. MRI could further  characterize if clinically indicated. 2. No acute hemorrhage. Code stroke imaging results were communicated on 04/17/2020 at 10:01 am to provider Dr. Leonel Ramsay via telephone, who verbally acknowledged these results. Electronically Signed   By: Margaretha Sheffield MD   On: 04/17/2020 10:07      Medications:     Scheduled Medications: .  stroke: mapping our early stages of recovery book   Does not apply Once  . amiodarone  200 mg Per Tube Daily  . aspirin  81 mg Oral Daily   Or  . aspirin  81 mg Per Tube Daily  . atorvastatin  40 mg Oral Daily  . chlorhexidine gluconate (MEDLINE KIT)  15 mL Mouth Rinse BID  . Chlorhexidine Gluconate Cloth  6 each Topical Daily  . digoxin  0.125 mg Per Tube Daily  . enoxaparin (LOVENOX) injection  40 mg Subcutaneous Q24H  . [START ON 04/19/2020] levothyroxine  25 mcg Per Tube QAC breakfast  . mouth rinse  15 mL Mouth Rinse 10 times per day  . sodium chloride flush  3 mL Intravenous Once  . ticagrelor  90 mg Oral BID   Or  . ticagrelor  90 mg Per Tube BID     Infusions: . sodium chloride 40 mL/hr at 04/18/20 0800  . sodium chloride    . clevidipine    . phenylephrine (NEO-SYNEPHRINE) Adult infusion 50 mcg/min (04/18/20 0800)  . 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  Admitted with LMCA stroke--cardioembolic with heart failure.   Assessment/Plan  1. 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  IR performed S/P  Thrombectomy. Remains intubated possible extubation later today.  Hold HTN meds. Aim to keep SBP 140 or >  - On Neo 50 mcg.   2. Chronic Systolic HF NICM  dating back to 2015. Possible Peripartum. Had Medtronic ICD ECHO today with EF ~20% RV ok, no LV thrombus visualized.  Hold bb, arni, SGT2i with stroke.  Volume status ok. No diuretics for now.   3. H/O VT /PVCs Continue amio 200 mg daily.  May need start IV amio if ectopy increased. For now hold off.   4.  Hypothyroidism On levothyroxine   5. OSA Recently diagnosed.  Length of Stay: 1  Amy Clegg, NP  04/18/2020, 8:47 AM  Advanced Heart Failure Team Pager [phone] (M-F; 7a - 4p)  Please contact [facility] for night-coverage after hours (4p -7a ) and weekends on [email or web address]  Patient seen with NP, agree with the above note.   She had left MCA CVA, s/p thrombectomy with stent.  Now on Brilinta/ASA.   She has been extubated, up to chair.  Left side remains weak.  SBP 120s currently on phenylephrine 25.    General: NAD Neck: JVP  8 cm, no thyromegaly or thyroid nodule.  Lungs: Clear to auscultation bilaterally with normal respiratory effort. CV: Lateral PMI.  Heart regular S1/S2, no S3/S4, no murmur.  Trace ankle edema.   Abdomen: Soft, nontender, no hepatosplenomegaly, no distention.  Skin: Intact without lesions or rashes.  Neurologic: Alert and oriented x 3.  Psych: Normal affect. Extremities: No clubbing or cyanosis.  HEENT: Normal.   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  No history of atrial fibrillation.  Question for neurology here will be her long-term antiplatelet/anticoagulant regimen => think she would ideally be on warfarin but at this time needs ASA/Brilinta.   She is off her cardiac meds to allow BP to run higher post-CVA.   - Wean off phenylephrine as needed (now on low dose).  - Restart torsemide 80 mg daily today.  - Restart spironolactone 25 mg daily.  - Continue digoxin.   H/o VT/VF, continue amiodarone.   [name] 04/18/2020 1:31 PM

## 2020-04-18 NOTE — Progress Notes (Addendum)
Referring Physician(s): Code stroke- Greta Doom (neurology)  Supervising Physician: Luanne Bras  Patient Status:  [facility] - In-pt  Chief Complaint: None- intubated with sedation  Subjective:  History of acute CVA s/p cerebral arteriogram with emergent mechanical thrombectomy of left MCA M1 occlusion, along with revascularization of left MCA inferior division occlusion using Atlas stent placement, achieving a TICI 2c revascularization via right femoral approach 04/17/2020 by Dr. Estanislado Pandy. Patient laying in bed intubated with sedation. She opens eyes to voice and follows simple commands. Husband at bedside. Can spontaneously move all extremities. Right femoral puncture site c/d/i.   Allergies: Patient has no known allergies.  Medications: Prior to Admission medications   Medication Sig Start Date End Date Taking? Authorizing Provider  amiodarone (PACERONE) 200 MG tablet Take 1 tablet (200 mg total) by mouth daily. 03/15/19  Yes Larey Dresser, MD  atorvastatin (LIPITOR) 40 MG tablet TAKE 1 TABLET BY MOUTH ONCE A DAY Patient taking differently: Take 40 mg by mouth daily.  02/06/20  Yes Larey Dresser, MD  carvedilol (COREG) 25 MG tablet TAKE 1 TABLET BY MOUTH TWICE A DAY WITH A MEAL Patient taking differently: Take 25 mg by mouth 2 (two) times daily with a meal.  03/15/19  Yes Larey Dresser, MD  CORLANOR 5 MG TABS tablet TAKE 1 TABLET BY MOUTH TWICE (2) DAILY WITH MEALS Patient taking differently: Take 5 mg by mouth 2 (two) times daily with a meal.  06/14/19  Yes Larey Dresser, MD  dapagliflozin propanediol (FARXIGA) 10 MG TABS tablet Take 10 mg by mouth daily before breakfast. 09/27/19  Yes Larey Dresser, MD  digoxin (LANOXIN) 0.125 MG tablet TAKE 1 TABLET BY MOUTH ONCE A DAY Patient taking differently: Take 0.125 mg by mouth daily.  03/05/20  Yes Larey Dresser, MD  ENTRESTO 49-51 MG TAKE 1 TABLET BY MOUTH TWICE (2) DAILY Patient taking differently:  Take 1 tablet by mouth 2 (two) times daily.  02/06/20  Yes Larey Dresser, MD  spironolactone (ALDACTONE) 25 MG tablet TAKE 1 TABLET BY MOUTH EVERY NIGHT AT BEDTIME Patient taking differently: Take 25 mg by mouth at bedtime.  10/06/19  Yes Larey Dresser, MD  torsemide (DEMADEX) 20 MG tablet Take 4 tablets (80 mg total) by mouth 2 (two) times daily for 3 days, THEN 4 tablets (80 mg total) in the morning for 30 days, THEN 3 tablets (60 mg total) every evening. Patient taking differently: Take 4 tablets (80mg ) in the morning and 2 tablets (40mg ) in the evening. 01/30/20 04/17/20 Yes Simmons, Brittainy M, PA-C  levothyroxine (SYNTHROID) 25 MCG tablet Take 1 tablet (25 mcg total) by mouth daily before breakfast. 09/23/18   Deboraha Sprang, MD  metolazone (ZAROXOLYN) 2.5 MG tablet Take 1 tablet (2.5 mg total) by mouth as needed (extra fluid as directed by HF clinic). 02/23/20   Lyda Jester M, PA-C  potassium chloride SA (KLOR-CON) 20 MEQ tablet Take 2 tablets (40 mEq total) by mouth 2 (two) times daily. Patient taking differently: Take 40 mEq by mouth 2 (two) times daily. Taking 2 tab in the morning and 2 tab at night 02/01/20   Lyda Jester M, PA-C  Vitamin D, Ergocalciferol, (DRISDOL) 1.25 MG (50000 UNIT) CAPS capsule Take 1 capsule (50,000 Units total) by mouth every 7 (seven) days. 01/17/20   Abby Potash, PA-C     Vital Signs: BP 96/67   Pulse 74   Temp 98.6 F (37 C) (Axillary)  Resp 18   Ht 5' 3.5" (1.613 m)   Wt 252 lb 6.8 oz (114.5 kg)   SpO2 100%   BMI 44.01 kg/m   Physical Exam Vitals and nursing note reviewed.  Constitutional:      General: She is not in acute distress.    Comments: Intubated with sedation.  Pulmonary:     Effort: Pulmonary effort is normal. No respiratory distress.     Comments: Intubated with sedation. Skin:    General: Skin is warm and dry.     Comments: Right femoral puncture site soft without active bleeding or hematoma.  Neurological:      Comments: Intubated with sedation. She opens eyes to voice and follows simple commands. PERRL bilaterally. Can spontaneously move all extremities. Distal pulses (DPs) 2+ bilaterally.     Imaging: CT Code Stroke CTA Head W/WO contrast  Result Date: 04/17/2020 CLINICAL DATA:  Right-sided weakness. EXAM: CT ANGIOGRAPHY HEAD AND NECK CT PERFUSION BRAIN TECHNIQUE: Multidetector CT imaging of the head and neck was performed using the standard protocol during bolus administration of intravenous contrast. Multiplanar CT image reconstructions and MIPs were obtained to evaluate the vascular anatomy. Carotid stenosis measurements (when applicable) are obtained utilizing NASCET criteria, using the distal internal carotid diameter as the denominator. Multiphase CT imaging of the brain was performed following IV bolus contrast injection. Subsequent parametric perfusion maps were calculated using RAPID software. CONTRAST:  160mL OMNIPAQUE IOHEXOL 350 MG/ML SOLN COMPARISON:  CT head 04/17/2020 FINDINGS: CTA NECK FINDINGS Aortic arch: On the initial scan, there is no arterial enhancement due to early trigger of the machine. Patient was rescanned immediately after the initial scan with satisfactory opacification of the intracranial arterial system. The neck was not re-scanned to evaluate the carotid and vertebral arteries in the neck. Right carotid system: Not evaluated due to lack of arterial contrast. Left carotid system: Not evaluated due to lack of arterial contrast. Vertebral arteries: Not evaluated. Skeleton: No acute abnormality. Other neck: Question left thyroidectomy. Right thyroid mildly enlarged without focal abnormality. No adenopathy in the neck. Upper chest: Mild hilar adenopathy bilaterally.  Lung apices clear. Review of the MIP images confirms the above findings CTA HEAD FINDINGS Anterior circulation: Internal carotid artery widely patent through the cavernous segment bilaterally. Occlusion left M1 segment  proximally. There is collateral circulation with good opacification of left M2 and M3 branches. Both anterior cerebral arteries are patent without stenosis. Right middle cerebral artery widely patent without stenosis. Posterior circulation: Both vertebral arteries patent to the basilar. PICA patent bilaterally. Basilar widely patent. Superior cerebellar and posterior cerebral arteries patent bilaterally without stenosis or large vessel occlusion. Venous sinuses: Normal venous enhancement. Anatomic variants: None Review of the MIP images confirms the above findings CT Brain Perfusion Findings: ASPECTS: 9 CBF (<30%) Volume: 0ML Perfusion (Tmax>6.0s) volume: 18mL Mismatch Volume: 42mL Infarction Location:Left MCA territory involving the frontal and parietal lobe. IMPRESSION: 1. CT perfusion positive for 64 mL of delayed perfusion in the left MCA territory. No fixed infarct on perfusion. 2. Occlusion proximal left M1 segment with good distal collateral flow to the left MCA branches. 3. No other intracranial stenosis 4. Carotid vertebral arteries in the neck were not evaluated due to lack of arterial opacification. The scan was performed early in the pulmonary arterial phase and subsequently immediately repeated with adequate intracranial arterial enhancement. 5. Images were reviewed with Dr. Leonel Ramsay following completion of the study Electronically Signed   By: Franchot Gallo M.D.   On: 04/17/2020 10:28  CT HEAD WO CONTRAST  Result Date: 04/18/2020 CLINICAL DATA:  Stroke, follow-up. Intracranial stent placed yesterday. EXAM: CT HEAD WITHOUT CONTRAST TECHNIQUE: Contiguous axial images were obtained from the base of the skull through the vertex without intravenous contrast. COMPARISON:  Noncontrast head CT performed 04/17/2020 at 6:18 p.m. Noncontrast head CT, CT angiogram head/neck and CT perfusion performed earlier the same day 04/17/2020. FINDINGS: Brain: Previously demonstrated vague hyperdensity within the  left basal ganglia, left insula and left frontoparietal cortex is no longer present. Abnormal hypodensity is again demonstrated within the posterior aspect of the left putamen suspicious for acute/early subacute infarction (series 3, image 16). No other definite loss of gray-white differentiation is identified. No evidence of acute intracranial hemorrhage. No extra-axial fluid collection. No evidence of intracranial mass. No midline shift. Vascular: Redemonstrated vascular stent in the region of the M1/M2 left middle cerebral artery. No hyperdense vessel. Skull: Normal. Negative for fracture or focal lesion. Sinuses/Orbits: Visualized orbits show no acute finding. Moderate-sized left maxillary sinus mucous retention cyst. No significant mastoid effusion. IMPRESSION: Previously demonstrated vague hyperdensity within the left basal ganglia, left insula and left frontoparietal cortex is no longer present and likely reflected contrast staining. Redemonstrated abnormal hypodensity within the posterior aspect of the left putamen suspicious for acute/early subacute infarction. No other definite loss of gray-white differentiation is identified. No acute intracranial hemorrhage. Redemonstrated vascular stent in the region of the M1/M2 left MCA. Electronically Signed   By: Kellie Simmering DO   On: 04/18/2020 10:02   CT HEAD WO CONTRAST  Result Date: 04/17/2020 CLINICAL DATA:  Stroke, follow-up; follow-up. CT post Cangrelor infusion. EXAM: CT HEAD WITHOUT CONTRAST TECHNIQUE: Contiguous axial images were obtained from the base of the skull through the vertex without intravenous contrast. COMPARISON:  Noncontrast head CT, CT angiogram head/neck and CT perfusion performed earlier the same day 04/17/2020. FINDINGS: Brain: There is subtle hyperdensity within the left basal ganglia as well as within the left insular cortex and left frontoparietal cortex along the left sylvian fissure, suspected to reflect contrast staining at site  of acute infarction. Gray-white differentiation is otherwise maintained. No frank parenchymal hemorrhage. No evidence of intracranial mass. No significant mass effect.  No midline shift. Vascular: A vascular stent is now present in the region of the M1/M2 left middle cerebral artery. Skull: Normal. Negative for fracture or focal lesion. Sinuses/Orbits: Visualized orbits show no acute finding. Mucous retention cyst within the left maxillary sinus. Trace scattered paranasal sinus mucosal thickening elsewhere. No significant mastoid effusion. Other: Partially visualized support tubes. IMPRESSION: Subtle hyperdensity is present within the left basal ganglia, as well as within the left insular cortex and left frontal parietal cortex along the left sylvian fissure. Findings are suspected to reflect contrast staining at site of acute infarction. Trace adjacent contrast extravasation/subarachnoid hemorrhage is difficult to exclude. Gray-white differentiation is otherwise maintained. No frank parenchymal hemorrhage. A vascular stent is now present in the region of the M1/M2 left middle cerebral artery. Electronically Signed   By: Kellie Simmering DO   On: 04/17/2020 18:51   CT Code Stroke CTA Neck W/WO contrast  Result Date: 04/17/2020 CLINICAL DATA:  Right-sided weakness. EXAM: CT ANGIOGRAPHY HEAD AND NECK CT PERFUSION BRAIN TECHNIQUE: Multidetector CT imaging of the head and neck was performed using the standard protocol during bolus administration of intravenous contrast. Multiplanar CT image reconstructions and MIPs were obtained to evaluate the vascular anatomy. Carotid stenosis measurements (when applicable) are obtained utilizing NASCET criteria, using the distal internal carotid diameter  as the denominator. Multiphase CT imaging of the brain was performed following IV bolus contrast injection. Subsequent parametric perfusion maps were calculated using RAPID software. CONTRAST:  13mL OMNIPAQUE IOHEXOL 350 MG/ML SOLN  COMPARISON:  CT head 04/17/2020 FINDINGS: CTA NECK FINDINGS Aortic arch: On the initial scan, there is no arterial enhancement due to early trigger of the machine. Patient was rescanned immediately after the initial scan with satisfactory opacification of the intracranial arterial system. The neck was not re-scanned to evaluate the carotid and vertebral arteries in the neck. Right carotid system: Not evaluated due to lack of arterial contrast. Left carotid system: Not evaluated due to lack of arterial contrast. Vertebral arteries: Not evaluated. Skeleton: No acute abnormality. Other neck: Question left thyroidectomy. Right thyroid mildly enlarged without focal abnormality. No adenopathy in the neck. Upper chest: Mild hilar adenopathy bilaterally.  Lung apices clear. Review of the MIP images confirms the above findings CTA HEAD FINDINGS Anterior circulation: Internal carotid artery widely patent through the cavernous segment bilaterally. Occlusion left M1 segment proximally. There is collateral circulation with good opacification of left M2 and M3 branches. Both anterior cerebral arteries are patent without stenosis. Right middle cerebral artery widely patent without stenosis. Posterior circulation: Both vertebral arteries patent to the basilar. PICA patent bilaterally. Basilar widely patent. Superior cerebellar and posterior cerebral arteries patent bilaterally without stenosis or large vessel occlusion. Venous sinuses: Normal venous enhancement. Anatomic variants: None Review of the MIP images confirms the above findings CT Brain Perfusion Findings: ASPECTS: 9 CBF (<30%) Volume: 0ML Perfusion (Tmax>6.0s) volume: 109mL Mismatch Volume: 66mL Infarction Location:Left MCA territory involving the frontal and parietal lobe. IMPRESSION: 1. CT perfusion positive for 64 mL of delayed perfusion in the left MCA territory. No fixed infarct on perfusion. 2. Occlusion proximal left M1 segment with good distal collateral flow to  the left MCA branches. 3. No other intracranial stenosis 4. Carotid vertebral arteries in the neck were not evaluated due to lack of arterial opacification. The scan was performed early in the pulmonary arterial phase and subsequently immediately repeated with adequate intracranial arterial enhancement. 5. Images were reviewed with Dr. Leonel Ramsay following completion of the study Electronically Signed   By: Franchot Gallo M.D.   On: 04/17/2020 10:28   CT Code Stroke Cerebral Perfusion with contrast  Result Date: 04/17/2020 CLINICAL DATA:  Right-sided weakness. EXAM: CT ANGIOGRAPHY HEAD AND NECK CT PERFUSION BRAIN TECHNIQUE: Multidetector CT imaging of the head and neck was performed using the standard protocol during bolus administration of intravenous contrast. Multiplanar CT image reconstructions and MIPs were obtained to evaluate the vascular anatomy. Carotid stenosis measurements (when applicable) are obtained utilizing NASCET criteria, using the distal internal carotid diameter as the denominator. Multiphase CT imaging of the brain was performed following IV bolus contrast injection. Subsequent parametric perfusion maps were calculated using RAPID software. CONTRAST:  148mL OMNIPAQUE IOHEXOL 350 MG/ML SOLN COMPARISON:  CT head 04/17/2020 FINDINGS: CTA NECK FINDINGS Aortic arch: On the initial scan, there is no arterial enhancement due to early trigger of the machine. Patient was rescanned immediately after the initial scan with satisfactory opacification of the intracranial arterial system. The neck was not re-scanned to evaluate the carotid and vertebral arteries in the neck. Right carotid system: Not evaluated due to lack of arterial contrast. Left carotid system: Not evaluated due to lack of arterial contrast. Vertebral arteries: Not evaluated. Skeleton: No acute abnormality. Other neck: Question left thyroidectomy. Right thyroid mildly enlarged without focal abnormality. No adenopathy in the neck.  Upper chest: Mild hilar adenopathy bilaterally.  Lung apices clear. Review of the MIP images confirms the above findings CTA HEAD FINDINGS Anterior circulation: Internal carotid artery widely patent through the cavernous segment bilaterally. Occlusion left M1 segment proximally. There is collateral circulation with good opacification of left M2 and M3 branches. Both anterior cerebral arteries are patent without stenosis. Right middle cerebral artery widely patent without stenosis. Posterior circulation: Both vertebral arteries patent to the basilar. PICA patent bilaterally. Basilar widely patent. Superior cerebellar and posterior cerebral arteries patent bilaterally without stenosis or large vessel occlusion. Venous sinuses: Normal venous enhancement. Anatomic variants: None Review of the MIP images confirms the above findings CT Brain Perfusion Findings: ASPECTS: 9 CBF (<30%) Volume: 0ML Perfusion (Tmax>6.0s) volume: 31mL Mismatch Volume: 49mL Infarction Location:Left MCA territory involving the frontal and parietal lobe. IMPRESSION: 1. CT perfusion positive for 64 mL of delayed perfusion in the left MCA territory. No fixed infarct on perfusion. 2. Occlusion proximal left M1 segment with good distal collateral flow to the left MCA branches. 3. No other intracranial stenosis 4. Carotid vertebral arteries in the neck were not evaluated due to lack of arterial opacification. The scan was performed early in the pulmonary arterial phase and subsequently immediately repeated with adequate intracranial arterial enhancement. 5. Images were reviewed with Dr. Leonel Ramsay following completion of the study Electronically Signed   By: Franchot Gallo M.D.   On: 04/17/2020 10:28   DG CHEST PORT 1 VIEW  Result Date: 04/18/2020 CLINICAL DATA:  Respiratory failure, ventilatory support EXAM: PORTABLE CHEST 1 VIEW COMPARISON:  04/17/2020 FINDINGS: Endotracheal tube 4 cm above the carina. Left subclavian pacemaker/defibrillator  noted. NG tube enters the stomach with the tip not visualized. Stable cardiomegaly with prominent central vascularity. Dense left lower lobe collapse/consolidation, slightly worse. Difficult to exclude developing left effusion. No significant pneumothorax. IMPRESSION: Increased left lower lobe retrocardiac collapse/consolidation. Stable cardiomegaly and vascular congestion Electronically Signed   By: Jerilynn Mages.  Shick M.D.   On: 04/18/2020 09:41   DG CHEST PORT 1 VIEW  Result Date: 04/17/2020 CLINICAL DATA:  Recent CVA.  Hypoxia. EXAM: PORTABLE CHEST 1 VIEW COMPARISON:  September 27, 2018 FINDINGS: Endotracheal tube tip is 3.8 cm above the carina. Nasogastric tube tip and side port are below the diaphragm. Pacemaker present with lead attached to the right ventricle, stable. There is ill-defined opacity in the left perihilar region. Lungs elsewhere clear. There is stable cardiomegaly with pulmonary vascularity normal. No adenopathy. No bone lesions. IMPRESSION: Tube positions as described without pneumothorax. Ill-defined opacity left perihilar region consistent with developing pneumonia or possibly aspiration. Lungs elsewhere clear. Stable cardiomegaly. Stable pacemaker lead positioning. Electronically Signed   By: Lowella Grip III M.D.   On: 04/17/2020 15:36   ECHOCARDIOGRAM COMPLETE  Result Date: 04/17/2020    ECHOCARDIOGRAM REPORT   Patient Name:   [name] Date of Exam: 04/17/2020 Medical Rec #:  [id number]    Height:       63.5 in Accession #:    [id number]   Weight:       252.4 lb Date of Birth:  [birth year]    BSA:          2.147 m Patient Age:    49 years     BP:           133/83 mmHg Patient Gender: F            HR:           66 bpm. Exam Location:  Inpatient Procedure: 2D Echo, Color Doppler and Cardiac Doppler Indications:    Stroke i163.9  History:        Patient has prior history of Echocardiogram examinations, most                 recent 09/27/2019. CHF, Defibrillator, Pulmonary HTN; Risk                  Factors:Hypertension and Dyslipidemia.  Sonographer:    Raquel Sarna Senior RDCS Referring Phys: [place] KIRKPATRICK  Sonographer Comments: Scanned supine on artificial respirator IMPRESSIONS  1. No evidence of left ventricular thrombus. Left ventricular ejection fraction, by estimation, is 20 to 25%. The left ventricle has severely decreased function. The left ventricle demonstrates global hypokinesis. The left ventricular internal cavity size was severely dilated. Left ventricular diastolic parameters are consistent with Grade III diastolic dysfunction (restrictive).  2. Right ventricular systolic function is mildly reduced. The right ventricular size is normal. There is mildly elevated pulmonary artery systolic pressure. The estimated right ventricular systolic pressure is 38.4 mmHg.  3. Left atrial size was severely dilated.  4. A small pericardial effusion is present. The pericardial effusion is circumferential. There is no evidence of cardiac tamponade.  5. The mitral valve is normal in structure. Mild to moderate mitral valve regurgitation.  6. The aortic valve is normal in structure. Aortic valve regurgitation is not visualized.  7. The inferior vena cava is dilated in size with <50% respiratory variability, suggesting right atrial pressure of 15 mmHg. FINDINGS  Left Ventricle: No evidence of left ventricular thrombus. Left ventricular ejection fraction, by estimation, is 20 to 25%. The left ventricle has severely decreased function. The left ventricle demonstrates global hypokinesis. The left ventricular internal cavity size was severely dilated. There is no left ventricular hypertrophy. Left ventricular diastolic parameters are consistent with Grade III diastolic dysfunction (restrictive). Right Ventricle: The right ventricular size is normal. No increase in right ventricular wall thickness. Right ventricular systolic function is mildly reduced. There is mildly elevated pulmonary artery systolic  pressure. The tricuspid regurgitant velocity  is 2.40 m/s, and with an assumed right atrial pressure of 15 mmHg, the estimated right ventricular systolic pressure is 66.5 mmHg. Left Atrium: Left atrial size was severely dilated. Right Atrium: Right atrial size was normal in size. Pericardium: A small pericardial effusion is present. The pericardial effusion is circumferential. There is no evidence of cardiac tamponade. Mitral Valve: The mitral valve is normal in structure. Mild to moderate mitral valve regurgitation, with posteriorly-directed jet. Tricuspid Valve: The tricuspid valve is normal in structure. Tricuspid valve regurgitation is trivial. Aortic Valve: The aortic valve is normal in structure. Aortic valve regurgitation is not visualized. Pulmonic Valve: The pulmonic valve was grossly normal. Pulmonic valve regurgitation is not visualized. Aorta: The aortic root and ascending aorta are structurally normal, with no evidence of dilitation. Venous: The inferior vena cava is dilated in size with less than 50% respiratory variability, suggesting right atrial pressure of 15 mmHg. IAS/Shunts: No atrial level shunt detected by color flow Doppler. Additional Comments: A pacer wire is visualized.  LEFT VENTRICLE PLAX 2D LVIDd:         7.60 cm LVIDs:         6.80 cm LV PW:         0.90 cm LV IVS:        0.80 cm LVOT diam:     1.90 cm LV SV:         31 LV  SV Index:   15 LVOT Area:     2.84 cm  LV Volumes (MOD) LV vol d, MOD A2C: 203.0 ml LV vol d, MOD A4C: 234.0 ml LV vol s, MOD A2C: 159.0 ml LV vol s, MOD A4C: 167.0 ml LV SV MOD A2C:     44.0 ml LV SV MOD A4C:     234.0 ml LV SV MOD BP:      57.4 ml RIGHT VENTRICLE RV S prime:     9.81 cm/s TAPSE (M-mode): 2.0 cm LEFT ATRIUM              Index       RIGHT ATRIUM           Index LA diam:        5.60 cm  2.61 cm/m  RA Area:     21.10 cm LA Vol (A2C):   91.9 ml  42.80 ml/m RA Volume:   57.10 ml  26.59 ml/m LA Vol (A4C):   126.0 ml 58.68 ml/m LA Biplane Vol: 111.0  ml 51.69 ml/m  AORTIC VALVE LVOT Vmax:   65.27 cm/s LVOT Vmean:  49.800 cm/s LVOT VTI:    0.111 m  AORTA Ao Root diam: 2.70 cm Ao Asc diam:  2.90 cm MR Peak grad:    65.0 mmHg   TRICUSPID VALVE MR Mean grad:    43.0 mmHg   TR Peak grad:   23.0 mmHg MR Vmax:         403.00 cm/s TR Vmax:        240.00 cm/s MR Vmean:        307.0 cm/s MR PISA:         1.57 cm    SHUNTS MR PISA Eff ROA: 12 mm      Systemic VTI:  0.11 m MR PISA Radius:  0.50 cm     Systemic Diam: 1.90 cm Dani Gobble Croitoru MD Electronically signed by Sanda Klein MD Signature Date/Time: 04/17/2020/6:01:10 PM    Final    CT HEAD CODE STROKE WO CONTRAST  Result Date: 04/17/2020 CLINICAL DATA:  Code stroke. Neuro deficit, acute stroke suspected. Right-sided weakness. EXAM: CT HEAD WITHOUT CONTRAST TECHNIQUE: Contiguous axial images were obtained from the base of the skull through the vertex without intravenous contrast. COMPARISON:  None. FINDINGS: Brain: Ill-defined hypodensity of the left putamen (for example see series 3, image 16). No evidence of acute hemorrhage, hydrocephalus, extra-axial collection or mass lesion/mass effect. Vascular: No definite hyperdense vessel. Skull: No acute fracture. Sinuses/Orbits: Left maxillary sinus retention cyst versus polyp. Unremarkable orbits. Other: No mastoid effusions. ASPECTS [facility] Stroke Program Early CT Score) - Ganglionic level infarction (caudate, lentiform nuclei, internal capsule, insula, M1-M3 cortex): 6 - Supraganglionic infarction (M4-M6 cortex): 3 Total score (0-10 with 10 being normal): 9 IMPRESSION: 1. Subtle hypodensity involving the left putamen, which may represent acute left MCA territory infarct. ASPECTS is 9. MRI could further characterize if clinically indicated. 2. No acute hemorrhage. Code stroke imaging results were communicated on 04/17/2020 at 10:01 am to provider Dr. Leonel Ramsay via telephone, who verbally acknowledged these results. Electronically Signed   By: Margaretha Sheffield  MD   On: 04/17/2020 10:07    Labs:  CBC: Recent Labs    04/17/20 0949 04/17/20 0949 04/17/20 1000 04/17/20 1630 04/17/20 1958 04/18/20 0950  WBC 9.1  --   --   --  14.5* 16.0*  HGB 11.2*   < > 11.9* 11.9* 11.2* 9.6*  HCT 38.0   < >  35.0* 35.0* 36.6 32.5*  PLT 304  --   --   --  421* 294   < > = values in this interval not displayed.    COAGS: Recent Labs    04/17/20 0949  INR 1.1  APTT 25    BMP: Recent Labs    01/26/20 1030 01/26/20 1030 01/31/20 1002 01/31/20 1002 02/08/20 0948 02/23/20 0903 02/23/20 0903 04/17/20 0949 04/17/20 1000 04/17/20 1630 04/18/20 0950  NA 136   < > 138   < > 138 138   < > 138 140 141 142  K 4.2   < > 3.3*   < > 4.4 3.5   < > 3.8 3.8 4.0 3.5  CL 106   < > 104   < > 102 104  --  106 107  --  115*  CO2 22   < > 26   < > 24 23  --  22  --   --  19*  GLUCOSE 102*   < > 73   < > 87 100*  --  106* 106*  --  91  BUN 18   < > 15   < > 18 12  --  16 17  --  9  CALCIUM 9.2   < > 9.3   < > 9.7 9.1  --  9.0  --   --  8.2*  CREATININE 1.41*   < > 1.31*   < > 1.26* 1.17*  --  1.28* 1.20*  --  0.89  GFRNONAA 44*   < > 48*   < > 51* 55*  --  51*  --   --  >60  GFRAA 51*  --  56*  --  58* >60  --   --   --   --   --    < > = values in this interval not displayed.    LIVER FUNCTION TESTS: Recent Labs    06/28/19 1020 06/28/19 1020 09/27/19 1028 12/30/19 1014 04/17/20 0949 04/18/20 0950  BILITOT 0.2*  --  0.4 0.3 0.6  --   AST 18  --  15 17 18   --   ALT 19  --  15 12 14   --   ALKPHOS 63  --  60 69 54  --   PROT 7.6  --  7.6 7.8 6.9  --   ALBUMIN 3.5   < > 3.5 3.5 3.4* 2.8*   < > = values in this interval not displayed.    Assessment and Plan:  History of acute CVA s/p cerebral arteriogram with emergent mechanical thrombectomy of left MCA M1 occlusion, along with revascularization of left MCA inferior division occlusion using Atlas stent placement, achieving a TICI 2c revascularization via right femoral approach 04/17/2020 by Dr.  Estanislado Pandy. Patient's condition stable- remains intubated/sedated, opens eyes to voice and follows simple commands, can spontaneously move all extremities. Right femoral puncture site stable, distal pulses (DPs) 2+ bilaterally. Continue taking Brilinta 90 mg twice daily and Aspirin 81 mg once daily. Will obtain P2Y12 today. Cannot obtain MRI/MRA secondary to ICD- will obtain CTA head/neck tomorrow AM per Dr. Estanislado Pandy. Plan to follow-up with Dr. Estanislado Pandy in clinic 4 weeks after discharge (IR schedulers to call patient to set up this appointment). Further plans per neurology/CCM- appreciate and agree with management. NIR to follow.   Electronically Signed: Earley Abide, PA-C 04/18/2020, 11:02 AM   I spent a total of 25 Minutes at the the patient's bedside AND  on the patient's hospital floor or unit, greater than 50% of which was counseling/coordinating care for CVA s/p revascularization.

## 2020-04-18 NOTE — Progress Notes (Signed)
NAME:  HAELYN FORGEY, MRN:  [id number], DOB:  10/11/1970, LOS: 1 ADMISSION DATE:  04/17/2020, CONSULTATION DATE:  04/17/2020 REFERRING MD: Dr. Leonel Ramsay, CHIEF COMPLAINT: Stroke  Brief History   49 yoF with hx of HFrEF with EF 20% presenting with right sided weakness and difficulty speaking found to have acute left MCA M1 occlusion.  Out of window for tPA. Intubated and taken to [facility] for mechanical thrombectomy and stent placement.  Returns to ICU intubated and sedated.   History of present illness   HPI obtained from medical chart review as patient is sedated and intubated on mechanical ventilation.   49 year old female with past medical history as below who presented to the ER as a code stroke.  She was last known well 10/25 around 2200.  Reportedly, patient woke up this morning around 0630 not feeling well, with right sided symptoms which progressed.  She was able to walk down stairs, then husband found her around 0830 with right sided paralysis and difficulty speaking.    In ER, she was normotensive and in NSR with normal oxygen saturations.   CT head showed subtle hypodensity of the left putamen which may represent acute left MCA infarct but otherwise no acute hemorrhage.  Taken for CTA head/ neck/ perfusion study which showed left M1 segment occlusion with large area of penumbra.  She was out of the window for tPA.  She was intubated and taken to Neuro IR for mechanical thrombectomy and stent placement achieving complete revascularization of occluded left MCA M1 segment.  Given her history of systolic heart failure who is managed by the HF team, HF team consulted.  Patient returns to Neuro ICU intubated and sedated on mechanical ventilation, therefore PCCM consulted for further ventilator and medical management.  Follow-up scans have been unremarkable  Past Medical History   Past Medical History:  Diagnosis Date  . Automatic implantable cardioverter-defibrillator in situ   . Chronic  CHF (congestive heart failure) (HCC)    a. EF 15-20% b. RHC (09/2013) RA 14, RV 57/22, PA 64/36 (48), PCWP 18, FIck CO/CI 3.7/1.6, PVR 8.1 WU, PA sat 47%   . History of stomach ulcers   . Hypertension   . Hypotension   . Hypothyroidism   . Morbid obesity ([place])   . Myocardial infarction ([place]) 08/2013  . Nocturnal dyspnea   . Nonischemic cardiomyopathy ([place])   . Sinus tachycardia   . Snoring-prob OSA 09/04/2011  . Sprint Fidelis ICD lead RECALL  [id number]   . UARS (upper airway resistance syndrome) 09/04/2011   HST 12/2013:  AHI 4/hr (numerous episodes of airflow reduction that did not have concomitant desaturation)    Significant Hospital Events   10/26 admitted -S/p mechanical thrombectomy and stent placement  Consults:  Neuro IR  Procedures:  10/26 endotracheal intubation 10/26 left radial A-line 10/26 mechanical thrombectomy and stent placement  Significant Diagnostic Tests:  10/26 CTH >> 1. Subtle hypodensity involving the left putamen, which may represent acute left MCA territory infarct. ASPECTS is 9. MRI could further characterize if clinically indicated. 2. No acute hemorrhage.  10/26 CTA head/ neck/ perfusion >> 1. CT perfusion positive for 64 mL of delayed perfusion in the left MCA territory. No fixed infarct on perfusion. 2. Occlusion proximal left M1 segment with good distal collateral flow to the left MCA branches. 3. No other intracranial stenosis 4. Carotid vertebral arteries in the neck were not evaluated due to lack of arterial opacification. The scan was performed early in the  pulmonary arterial phase and subsequently immediately repeated with adequate intracranial arterial enhancement. 5. Images were reviewed with Dr. Leonel Ramsay following completion of the study  MRA/ MRI  >>  Echocardiogram -Ejection fraction of 20 to 25%, mildly elevated right-sided pressures  CT head 04/17/2020 at 1825 IMPRESSION: Subtle hyperdensity is present within the left basal  ganglia, as well as within the left insular cortex and left frontal parietal cortex along the left sylvian fissure. Findings are suspected to reflect contrast staining at site of acute infarction. Trace adjacent contrast extravasation/subarachnoid hemorrhage is difficult to exclude.  Gray-white differentiation is otherwise maintained. No frank parenchymal hemorrhage.  A vascular stent is now present in the region of the M1/M2 left middle cerebral artery. Micro Data:    Antimicrobials:  Cefazolin 10/26  Interim history/subjective:  No overnight events Awake, interactive Able to answer questions by nodding  Objective   Blood pressure 110/75, pulse 71, temperature 98.7 F (37.1 C), temperature source Axillary, resp. rate 20, height 5' 3.5" (1.613 m), weight 114.5 kg, SpO2 100 %.    Vent Mode: PRVC FiO2 (%):  [40 %-50 %] 40 % Set Rate:  [12 bmp] 12 bmp Vt Set:  [430 mL] 430 mL PEEP:  [5 cmH20] 5 cmH20 Plateau Pressure:  [15 SPQ33-00 cmH20] 15 cmH20   Intake/Output Summary (Last 24 hours) at 04/18/2020 7622 Last data filed at 04/18/2020 0600 Gross per 24 hour  Intake 4521 ml  Output 1530 ml  Net 2991 ml   Filed Weights   04/17/20 0900  Weight: 114.5 kg    Examination: General: Middle-aged lady, does not appear to be in distress HENT: Endotracheal tube in place, moist oral mucosa Lungs: Clear breath sounds bilaterally Cardiovascular: S1-S2 appreciated Abdomen: Soft, bowel sounds appreciated Extremities: No clubbing, no edema Neuro: Awake, interactive GU: Fair output  Chest x-ray 10/26 reviewed by myself showing some left perihilar infiltrate, cardiomegaly  Resolved Hospital Problem list     Assessment & Plan:  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 s/p complete revascularization with mechanical thrombectomy and stent placement -Neurology continues to follow -Continue neuro monitoring -On Brilinta -On phenylephrine to maintain blood  pressure  Respiratory sufficiency -Continue mechanical ventilation -Tolerating pressure support this morning -Propofol for sedation  At risk for kidney injury -Continue to monitor -Avoid nephrotoxic's  Heart failure with reduced ejection fraction -EF 20% -On digoxin, Farxiga, amiodarone -Hold Coreg, metolazone, spironolactone, torsemide  Hypertension -Holding home antihypertensives  Hypothyroidism -Continue home Synthroid  Best practice:  Diet: N.p.o. Pain/Anxiety/Delirium protocol (if indicated): Propofol VAP protocol (if indicated): In place DVT prophylaxis: Lovenox GI prophylaxis:  Glucose control: Monitor Mobility: Bedrest Code Status: Full code Family Communication: Will update Disposition: ICU  Labs   CBC: Recent Labs  Lab 04/17/20 0949 04/17/20 1000 04/17/20 1630 04/17/20 1958  WBC 9.1  --   --  14.5*  NEUTROABS 6.6  --   --   --   HGB 11.2* 11.9* 11.9* 11.2*  HCT 38.0 35.0* 35.0* 36.6  MCV 82.1  --   --  79.9*  PLT 304  --   --  421*    Basic Metabolic Panel: Recent Labs  Lab 04/17/20 0949 04/17/20 1000 04/17/20 1630 04/17/20 1958  NA 138 140 141  --   K 3.8 3.8 4.0  --   CL 106 107  --   --   CO2 22  --   --   --   GLUCOSE 106* 106*  --   --   BUN  16 17  --   --   CREATININE 1.28* 1.20*  --   --   CALCIUM 9.0  --   --   --   MG  --   --   --  1.7   GFR: Estimated Creatinine Clearance: 69.8 mL/min (A) (by C-G formula based on SCr of 1.2 mg/dL (H)). Recent Labs  Lab 04/17/20 0949 04/17/20 1958  WBC 9.1 14.5*    Liver Function Tests: Recent Labs  Lab 04/17/20 0949  AST 18  ALT 14  ALKPHOS 54  BILITOT 0.6  PROT 6.9  ALBUMIN 3.4*   No results for input(s): LIPASE, AMYLASE in the last 168 hours. No results for input(s): AMMONIA in the last 168 hours.  ABG    Component Value Date/Time   PHART 7.341 (L) 04/17/2020 1630   PCO2ART 41.4 04/17/2020 1630   PO2ART 136 (H) 04/17/2020 1630   HCO3 22.4 04/17/2020 1630   TCO2 24  04/17/2020 1630   ACIDBASEDEF 3.0 (H) 04/17/2020 1630   O2SAT 99.0 04/17/2020 1630     Coagulation Profile: Recent Labs  Lab 04/17/20 0949  INR 1.1    Cardiac Enzymes: No results for input(s): CKTOTAL, CKMB, CKMBINDEX, TROPONINI in the last 168 hours.  HbA1C: Hemoglobin A1C  Date/Time Value Ref Range Status  09/19/2013 02:09 PM 5.9 4.2 - 6.3 % Final    Comment:    The American Diabetes Association recommends that a primary goal of therapy should be <7% and that physicians should reevaluate the treatment regimen in patients with HbA1c values consistently >8%.    Hgb A1c MFr Bld  Date/Time Value Ref Range Status  12/01/2019 04:13 PM 5.3 4.8 - 5.6 % Final    Comment:             Prediabetes: 5.7 - 6.4          Diabetes: >6.4          Glycemic control for adults with diabetes: <7.0   07/04/2019 01:30 PM 5.3 4.8 - 5.6 % Final    Comment:             Prediabetes: 5.7 - 6.4          Diabetes: >6.4          Glycemic control for adults with diabetes: <7.0     CBG: Recent Labs  Lab 04/17/20 0949  GLUCAP 109*    Review of Systems:   Unobtainable Denies any pain or discomfort  Past Medical History  She,  has a past medical history of Automatic implantable cardioverter-defibrillator in situ, Chronic CHF (congestive heart failure) ([place]), History of stomach ulcers, Hypertension, Hypotension, Hypothyroidism, Morbid obesity ([place]), Myocardial infarction ([place]) (08/2013), Nocturnal dyspnea, Nonischemic cardiomyopathy ([place]), Sinus tachycardia, Snoring-prob OSA (09/04/2011), Sprint Fidelis ICD lead RECALL  6949, and UARS (upper airway resistance syndrome) (09/04/2011).   Surgical History    Past Surgical History:  Procedure Laterality Date  . BREATH TEK H PYLORI N/A 11/09/2014   Procedure: BREATH TEK H PYLORI;  Surgeon: Greer Pickerel, MD;  Location: [facility];  Service: General;  Laterality: N/A;  . CARDIAC CATHETERIZATION  ~ 2006; 09/2013  . CARDIAC CATHETERIZATION N/A 05/23/2016    Procedure: Right Heart Cath;  Surgeon: Larey Dresser, MD;  Location: [facility];  Service: Cardiovascular;  Laterality: N/A;  . CARDIAC DEFIBRILLATOR PLACEMENT  2006; 12/26/2013   [employer] [id number]; 12-2013 ICD gen change and RV lead revision with new 7096 RV lead by Dr [name]  .  CESAREAN SECTION  1999  . IMPLANTABLE CARDIOVERTER DEFIBRILLATOR GENERATOR CHANGE N/A 12/26/2013   Procedure: IMPLANTABLE CARDIOVERTER DEFIBRILLATOR GENERATOR CHANGE;  Surgeon: Deboraha Sprang, MD;  Location: [facility] CATH LAB;  Service: Cardiovascular;  Laterality: N/A;  . LEAD REVISION N/A 12/26/2013   Procedure: LEAD REVISION;  Surgeon: Deboraha Sprang, MD;  Location: [facility] CATH LAB;  Service: Cardiovascular;  Laterality: N/A;  . RADIOLOGY WITH ANESTHESIA N/A 04/17/2020   Procedure: Code Stroke;  Surgeon: Luanne Bras, MD;  Location: [facility];  Service: Radiology;  Laterality: N/A;  . RIGHT HEART CATHETERIZATION N/A 02/15/2014   Procedure: RIGHT HEART CATH;  Surgeon: Larey Dresser, MD;  Location: [facility] CATH LAB;  Service: Cardiovascular;  Laterality: N/A;  . [place] History   reports that she has never smoked. She has never used smokeless tobacco. She reports that she does not drink alcohol and does not use drugs.   Family History   Her family history includes Heart disease in her maternal grandmother and mother; High blood pressure in her father; Obesity in her mother; Stroke in her father.   Allergies No Known Allergies   The patient is critically ill with multiple organ systems failure and requires high complexity decision making for assessment and support, frequent evaluation and titration of therapies, application of advanced monitoring technologies and extensive interpretation of multiple databases. Critical Care Time devoted to patient care services described in this note independent of APP/resident time (if applicable)  is 30 minutes.   Sherrilyn Rist MD [facility]  Critical Care Personal pager: [phone] If unanswered, please page CCM On-call: [phone]

## 2020-04-18 NOTE — Discharge Instructions (Addendum)
Femoral Site Care This sheet gives you information about how to care for yourself after your procedure. Your health care provider may also give you more specific instructions. If you have problems or questions, contact your health care provider. What can I expect after the procedure? After the procedure, it is common to have:  Bruising that usually fades within 1-2 weeks.  Tenderness at the site. Follow these instructions at home: Wound care 1. Follow instructions from your health care provider about how to take care of your insertion site. Make sure you: ? Wash your hands with soap and water before you change your bandage (dressing). If soap and water are not available, use hand sanitizer. ? Change your dressing as directed- pressure dressing removed 24 hours post-procedure (and switch for bandaid), bandaid removed 72 hours post-procedure 2. Do not take baths, swim, or use a hot tub for 7 days post-procedure. 3. You may shower 48 hours after the procedure or as told by your health care provider. ? Gently wash the site with plain soap and water. ? Pat the area dry with a clean towel. ? Do not rub the site. This may cause bleeding. 4. Check your site every day for signs of infection. Check for: ? Redness, swelling, or pain. ? Fluid or blood. ? Warmth. ? Pus or a bad smell. Activity  Do not stoop, bend, or lift anything that is heavier than 10 lb (4.5 kg) for 2 weeks post-procedure.  Do not drive self for 2 weeks post-procedure. Contact a health care provider if you have:  A fever or chills.  You have redness, swelling, or pain around your insertion site. Get help right away if:  The catheter insertion area swells very fast.  You pass out.  You suddenly start to sweat or your skin gets clammy.  The catheter insertion area is bleeding, and the bleeding does not stop when you hold steady pressure on the area.  The area near or just beyond the catheter insertion site becomes  pale, cool, tingly, or numb. These symptoms may represent a serious problem that is an emergency. Do not wait to see if the symptoms will go away. Get medical help right away. Call your local emergency services (911 in the [place] Do not drive yourself to the hospital.  This information is not intended to replace advice given to you by your health care provider. Make sure you discuss any questions you have with your health care provider. Document Revised: 06/22/2017 Document Reviewed: 06/22/2017 Elsevier Patient Education  2020 [employer] Inc. 

## 2020-04-18 NOTE — Progress Notes (Signed)
Extubated to nasal cannula 1008hrs, 04/18/2020

## 2020-04-18 NOTE — Progress Notes (Addendum)
RT at bedside to find pt had been extubated and placed on 3LNC. RT was not made aware. Pt has SVS. No stridor noted pt able to state her name.

## 2020-04-18 NOTE — Progress Notes (Signed)
SLP Cancellation Note  Patient Details Name: [name] MRN: [id number] DOB: October 05, 1970   Cancelled treatment:       Reason Eval/Treat Not Completed: Medical issues which prohibited therapy (on vent this am). Will f/u as able.   Osie Bond., M.A. [facility] Pager [phone] Office [phone]  04/18/2020, 8:34 AM

## 2020-04-18 NOTE — Evaluation (Signed)
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 Pt out of window for tPA; underwent mechanical thrombectomy and stent placement on 10/26. 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  Pt needing min to mod assist for basic mobility and gait.Marland Kitchen  Pt currently limited functionally due to the problems listed. ( See problems list.)   Pt will benefit from PT to maximize function and safety in order to get ready for next venue listed below.     Follow Up Recommendations CIR    Equipment Recommendations  Other (comment) (TBA)    Recommendations for Other Services Rehab consult     Precautions / Restrictions Precautions Precautions: Fall Precaution Comments: R side weakness      Mobility  Bed Mobility Overal bed mobility: Needs Assistance Bed Mobility: Rolling;Sidelying to Sit Rolling: Mod assist Sidelying to sit: Mod assist       General bed mobility comments: VCs for sequencing/carrying out tasks, assist for RLE over EOB and trunk elevation; given increased time/cues pt is able to scoot herself towards EOB     Transfers Overall transfer level: Needs assistance Equipment used: 1 person hand held assist;2 person hand held assist Transfers: Sit to/from Stand Sit to Stand: Min assist;+2 safety/equipment         General transfer comment: performed x2 from EOB; light stabilizing assist once upright, cues for safe hand placement   Ambulation/Gait             General Gait Details: pivot only, delay in execution.  Stairs            Wheelchair Mobility    Modified  Rankin (Stroke Patients Only)       Balance Overall balance assessment: Needs assistance Sitting-balance support: Feet supported Sitting balance-Leahy Scale: Fair Sitting balance - Comments: static balance with minguard assist    Standing balance support: Single extremity supported;Bilateral upper extremity supported Standing balance-Leahy Scale: Poor Standing balance comment: grossly minA at this time                              Pertinent Vitals/Pain Pain Assessment: Faces Faces Pain Scale: No hurt Pain Intervention(s): Monitored during session    Home Living Family/patient expects to be discharged to:: Private residence Living Arrangements: Spouse/significant other;Children (son) Available Help at Discharge: Family (husband works from home) Type of Home: House Home Access: Stairs to enter Entrance Stairs-Rails: None Technical brewer of Steps: 3 Home Layout: Two level Home Equipment: None      Prior Function Level of Independence: Independent         Comments: required rest breaks for ADL tasks, spouse assisted with socks, performed simple iADL tasks, doesn't drive     Hand Dominance   Dominant Hand: Right    Extremity/Trunk Assessment   Upper Extremity Assessment Upper Extremity Assessment: Defer to OT evaluation    Lower Extremity Assessment Lower Extremity Assessment: RLE deficits/detail RLE Deficits / Details: grossly 3+/5, decreased coordination  RLE Sensation: decreased light touch RLE Coordination: decreased fine motor    Cervical / Trunk Assessment Cervical / Trunk Assessment: Normal  Communication   Communication: Expressive difficulties  Cognition Arousal/Alertness: Awake/alert Behavior During Therapy: WFL for tasks assessed/performed Overall Cognitive Status: Impaired/Different from baseline Area of Impairment: Problem solving;Following commands;Awareness;Memory                     Memory: Decreased short-term  memory Following Commands: Follows one step commands with increased time;Follows one step commands inconsistently   Awareness: Emergent Problem Solving: Slow processing;Decreased initiation;Requires verbal cues;Requires tactile cues General Comments: pt A&Ox4, word finding difficulties, increased processing time and increased time for verbalizations (approx 5-10 second delay); often requires multimodal cueing for command follow       General Comments General comments (skin integrity, edema, etc.): spouse present and supportive end of session; BP soft but stable, all other vitals stable on 4L O2 via [place]    Exercises     Assessment/Plan    PT Assessment Patient needs continued PT services  PT Problem List Decreased strength;Decreased activity tolerance;Decreased balance;Decreased mobility;Decreased coordination;Impaired tone       PT Treatment Interventions Gait training;Stair training;Functional mobility training;Therapeutic activities;Balance training;Patient/family education;Neuromuscular re-education    PT Goals (Current goals can be found in the Care Plan section)  Acute Rehab PT Goals Patient Stated Goal: return to independent level PT Goal Formulation: With patient Time For Goal Achievement: 05/02/20 Potential to Achieve Goals: Good    Frequency Min 4X/week   Barriers to discharge        Co-evaluation PT/OT/SLP Co-Evaluation/Treatment: Yes Reason for Co-Treatment: Complexity of the patient's impairments (multi-system involvement) PT goals addressed during session: Mobility/safety with mobility OT goals addressed during session: ADL's and self-care       AM-PAC PT "6 Clicks" Mobility  Outcome Measure Help needed turning from your back to your side while in a flat bed without using bedrails?: A Little Help needed moving from lying on your back to sitting on the side of a flat bed without using bedrails?: A Lot Help needed moving to and from a bed to a chair  (including a wheelchair)?: A Little Help needed standing up from a chair using your arms (e.g., wheelchair or bedside chair)?: A Little Help needed to walk in hospital room?: A Little Help needed climbing 3-5 steps with a railing? : A Lot 6 Click Score: 16    End of Session   Activity Tolerance: Patient tolerated treatment well Patient left: in bed;with call Pickel/phone within reach;with bed alarm set Nurse Communication: Mobility status PT Visit Diagnosis: Unsteadiness on feet (R26.81);Other symptoms and signs involving the nervous system (R29.898)    Time: 4158-3094 PT Time Calculation (min) (ACUTE ONLY): 32 min   Charges:   PT Evaluation $PT Eval Moderate Complexity: 1 Mod          04/18/2020  Ginger Carne., PT Acute Rehabilitation Services [phone]  (pager) [phone]  (office)  Tessie Fass Dream Nodal 04/18/2020, 5:03 PM

## 2020-04-18 NOTE — Evaluation (Signed)
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 Pt out of window for tPA; underwent mechanical thrombectomy and stent placement on 10/26. PMHx includes chronic CHF, Automatic implantable cardioverter-defibrillator in situ, HTN, MI   Clinical Impression   This 49 y/o female presents with the above. PTA pt living with spouse and son at home, receiving assist for some aspects of LB ADL but otherwise was independent with ADL and mobility. Pt currently presenting with the above and below listed deficits, including R side weakness, impaired cognition including notable delay in processing/response time and with reduced command follow. Pt tolerating EOB/OOB to recliner during session, initially with +2 HHA progressed to +1 HHA (+2 safety). She requires up to [facility] for ADL at this time. Pt to benefit from continued acute OT services and currently feel she is an excellent candidate for CIR level therapies at time of discharge to maximize her overall safety and independence with ADL and mobility.     Follow Up Recommendations  CIR    Equipment Recommendations  3 in 1 bedside commode;Other (comment) (TBD)    Recommendations for Other Services Rehab consult     Precautions / Restrictions Precautions Precautions: Fall Precaution Comments: R side weakness Restrictions Weight Bearing Restrictions: No      Mobility Bed Mobility Overal bed mobility: Needs Assistance Bed Mobility: Rolling;Sidelying to Sit Rolling: Mod assist Sidelying to sit: Mod assist       General bed mobility comments: VCs for sequencing/carrying out tasks, assist for RLE over EOB and trunk elevation; given increased time/cues pt is able to scoot herself towards  EOB     Transfers Overall transfer level: Needs assistance Equipment used: 1 person hand held assist;2 person hand held assist Transfers: Sit to/from Stand Sit to Stand: Min assist;+2 safety/equipment         General transfer comment: performed x2 from EOB; light stabilizing assist once upright, cues for safe hand placement     Balance Overall balance assessment: Needs assistance Sitting-balance support: Feet supported Sitting balance-Leahy Scale: Fair Sitting balance - Comments: static balance with minguard assist    Standing balance support: Single extremity supported;Bilateral upper extremity supported Standing balance-Leahy Scale: Poor Standing balance comment: grossly minA at this time                            ADL either performed or assessed with clinical judgement   ADL Overall ADL's : Needs assistance/impaired Eating/Feeding: NPO   Grooming: Minimal assistance;Sitting   Upper Body Bathing: Minimal assistance;Sitting   Lower Body Bathing: Moderate assistance;Sit to/from stand;+2 for safety/equipment   Upper Body Dressing : Minimal assistance;Sitting   Lower Body Dressing: Moderate assistance;+2 for safety/equipment;Sit to/from stand Lower Body Dressing Details (indicate cue type and reason): assist to don socks  Toilet Transfer: Minimal assistance;+2 for safety/equipment;+2 for physical assistance Toilet Transfer Details (indicate cue type and reason): simulated via few steps to recliner; +2 minA progressed to minA+1 (+2 safety) Toileting- Clothing Manipulation and Hygiene: Moderate assistance;Sit to/from stand       Functional mobility during ADLs: Minimal assistance;+2 for physical assistance;+2 for safety/equipment (HHA)       Vision Baseline Vision/History: Wears glasses Wears Glasses: Reading only Patient Visual Report: Other (  comment) ("black spots") Vision Assessment?: Vision impaired- to be further tested in functional  context Additional Comments: will continue to assess      Perception     Praxis      Pertinent Vitals/Pain Pain Assessment: Faces Faces Pain Scale: No hurt Pain Intervention(s): Monitored during session     Hand Dominance Right   Extremity/Trunk Assessment Upper Extremity Assessment Upper Extremity Assessment: RUE deficits/detail RUE Deficits / Details: grossly 3/5 throughout - pt not necessarily moving with OT attempts at ROM/on command, but noted moving volitionally during mobility/functional tasks RUE Sensation: decreased light touch;decreased proprioception RUE Coordination: decreased fine motor;decreased gross motor   Lower Extremity Assessment Lower Extremity Assessment: Defer to PT evaluation   Cervical / Trunk Assessment Cervical / Trunk Assessment: Normal   Communication Communication Communication: Expressive difficulties   Cognition Arousal/Alertness: Awake/alert Behavior During Therapy: WFL for tasks assessed/performed Overall Cognitive Status: Impaired/Different from baseline Area of Impairment: Problem solving;Following commands;Awareness;Memory                     Memory: Decreased short-term memory Following Commands: Follows one step commands with increased time;Follows one step commands inconsistently   Awareness: Emergent Problem Solving: Slow processing;Decreased initiation;Requires verbal cues;Requires tactile cues General Comments: pt A&Ox4, word finding difficulties, increased processing time and increased time for verbalizations (approx 5-10 second delay); often requires multimodal cueing for command follow    General Comments  spouse present and supportive end of session; BP soft but stable, all other vitals stable on 4L O2 via [place]    Exercises     Shoulder Instructions      Home Living Family/patient expects to be discharged to:: Private residence Living Arrangements: Spouse/significant other;Children (son) Available Help at  Discharge: Family (husband works from home) Type of Home: House Home Access: Stairs to enter Technical brewer of Steps: 3 Entrance Stairs-Rails: None Home Layout: Two level Alternate Level Stairs-Number of Steps: flight Alternate Level Stairs-Rails: Right;Left Bathroom Shower/Tub: Teacher, early years/pre: Standard     Home Equipment: None          Prior Functioning/Environment Level of Independence: Independent        Comments: required rest breaks for ADL tasks, spouse assisted with socks, performed simple iADL tasks, doesn't drive        OT Problem List: Decreased strength;Decreased range of motion;Decreased activity tolerance;Impaired balance (sitting and/or standing);Impaired vision/perception;Decreased coordination;Decreased cognition;Decreased safety awareness;Decreased knowledge of precautions;Decreased knowledge of use of DME or AE;Obesity;Impaired UE functional use      OT Treatment/Interventions: Self-care/ADL training;Therapeutic exercise    OT Goals(Current goals can be found in the care plan section) Acute Rehab OT Goals Patient Stated Goal: return to independent level OT Goal Formulation: With patient Time For Goal Achievement: 05/02/20 Potential to Achieve Goals: Good  OT Frequency: Min 2X/week   Barriers to D/C:            Co-evaluation PT/OT/SLP Co-Evaluation/Treatment: Yes Reason for Co-Treatment: Complexity of the patient's impairments (multi-system involvement);For patient/therapist safety;To address functional/ADL transfers   OT goals addressed during session: ADL's and self-care;Strengthening/ROM      AM-PAC OT "6 Clicks" Daily Activity     Outcome Measure Help from another person eating meals?: Total (NPO) Help from another person taking care of personal grooming?: A Little Help from another person toileting, which includes using toliet, bedpan, or urinal?: A Lot Help from another person bathing (including washing,  rinsing, drying)?: A Lot Help from another person to put on and  taking off regular upper body clothing?: A Little Help from another person to put on and taking off regular lower body clothing?: A Lot 6 Click Score: 13   End of Session Equipment Utilized During Treatment: Oxygen (4L) Nurse Communication: Mobility status  Activity Tolerance: Patient tolerated treatment well Patient left: in chair;with call Votta/phone within reach;with nursing/sitter in room;with chair alarm set  OT Visit Diagnosis: Hemiplegia and hemiparesis;Cognitive communication deficit (R41.841);Unsteadiness on feet (R26.81);Other symptoms and signs involving the nervous system (R29.898) Symptoms and signs involving cognitive functions: Cerebral infarction Hemiplegia - Right/Left: Right Hemiplegia - dominant/non-dominant: Dominant Hemiplegia - caused by: Cerebral infarction                Time: 3154-0086 OT Time Calculation (min): 40 min Charges:  OT General Charges $OT Visit: 1 Visit OT Evaluation $OT Eval High Complexity: 1 High OT Treatments $Self Care/Home Management : 8-22 mins  Lou Cal, OT Acute Rehabilitation Services Pager [phone] Office [phone]   [name] 04/18/2020, 2:03 PM

## 2020-04-19 ENCOUNTER — Inpatient Hospital Stay (HOSPITAL_COMMUNITY): Payer: BC Managed Care – PPO

## 2020-04-19 DIAGNOSIS — I5022 Chronic systolic (congestive) heart failure: Secondary | ICD-10-CM | POA: Diagnosis not present

## 2020-04-19 LAB — HEMOGLOBIN A1C
Hgb A1c MFr Bld: 5.5 % (ref 4.8–5.6)
Mean Plasma Glucose: 111 mg/dL

## 2020-04-19 MED ORDER — AMIODARONE HCL 200 MG PO TABS
200.0000 mg | ORAL_TABLET | Freq: Two times a day (BID) | ORAL | Status: DC
  Administered 2020-04-19 – 2020-04-20 (×2): 200 mg via ORAL
  Filled 2020-04-19 (×2): qty 1

## 2020-04-19 MED ORDER — IOHEXOL 350 MG/ML SOLN
100.0000 mL | Freq: Once | INTRAVENOUS | Status: AC | PRN
  Administered 2020-04-19: 100 mL via INTRAVENOUS

## 2020-04-19 MED ORDER — TORSEMIDE 20 MG PO TABS
40.0000 mg | ORAL_TABLET | Freq: Every evening | ORAL | Status: DC
  Administered 2020-04-19: 40 mg via ORAL
  Filled 2020-04-19 (×2): qty 2

## 2020-04-19 MED ORDER — POTASSIUM CHLORIDE CRYS ER 20 MEQ PO TBCR
40.0000 meq | EXTENDED_RELEASE_TABLET | Freq: Once | ORAL | Status: AC
  Administered 2020-04-19: 40 meq via ORAL
  Filled 2020-04-19: qty 2

## 2020-04-19 MED ORDER — CARVEDILOL 3.125 MG PO TABS
3.1250 mg | ORAL_TABLET | Freq: Two times a day (BID) | ORAL | Status: DC
  Administered 2020-04-19 – 2020-04-20 (×2): 3.125 mg via ORAL
  Filled 2020-04-19 (×2): qty 1

## 2020-04-19 MED ORDER — DAPAGLIFLOZIN PROPANEDIOL 10 MG PO TABS
10.0000 mg | ORAL_TABLET | Freq: Every day | ORAL | Status: DC
  Administered 2020-04-19 – 2020-04-20 (×2): 10 mg via ORAL
  Filled 2020-04-19 (×2): qty 1

## 2020-04-19 NOTE — Progress Notes (Addendum)
STROKE TEAM PROGRESS NOTE   INTERVAL HISTORY  Her husband is at the bedside. She was extubated and is alert and oriented, is feeling much better today and feels like her strength is slightly improved.    Vitals:   04/19/20 0430 04/19/20 0500 04/19/20 0530 04/19/20 0600  BP: (!) 76/59 (!) 63/47 (!) 70/42   Pulse: 96 88 85 87  Resp: 16 15 16 17   Temp:      TempSrc:      SpO2: 99% 98% 97% 99%  Weight:      Height:       CBC:  Recent Labs  Lab 04/17/20 0949 04/17/20 1000 04/17/20 1958 04/18/20 0950  WBC 9.1   < > 14.5* 16.0*  NEUTROABS 6.6  --   --  13.0*  HGB 11.2*   < > 11.2* 9.6*  HCT 38.0   < > 36.6 32.5*  MCV 82.1   < > 79.9* 80.2  PLT 304   < > 421* 294   < > = values in this interval not displayed.   Basic Metabolic Panel:  Recent Labs  Lab 04/17/20 0949 04/17/20 0949 04/17/20 1000 04/17/20 1000 04/17/20 1630 04/17/20 1958 04/18/20 0950  NA 138   < > 140   < > 141  --  142  K 3.8   < > 3.8   < > 4.0  --  3.5  CL 106   < > 107  --   --   --  115*  CO2 22  --   --   --   --   --  19*  GLUCOSE 106*   < > 106*  --   --   --  91  BUN 16   < > 17  --   --   --  9  CREATININE 1.28*   < > 1.20*  --   --   --  0.89  CALCIUM 9.0  --   --   --   --   --  8.2*  MG  --   --   --   --   --  1.7 1.6*  PHOS  --   --   --   --   --   --  2.8   < > = values in this interval not displayed.   Lipid Panel:  Recent Labs  Lab 04/18/20 0950  CHOL 198  TRIG 92  HDL 50  CHOLHDL 4.0  VLDL 18  LDLCALC 130*   HgbA1c: 5.3 12/01/2019 Urine Drug Screen: pending  Alcohol Level No results for input(s): ETH in the last 168 hours.  IMAGING past 24 hours CT ANGIO HEAD W OR WO CONTRAST  Result Date: 04/19/2020 CLINICAL DATA:  Mechanical thrombectomy with stent placement 2 days ago. Follow-up EXAM: CT ANGIOGRAPHY HEAD AND NECK TECHNIQUE: Multidetector CT imaging of the head and neck was performed using the standard protocol during bolus administration of intravenous contrast.  Multiplanar CT image reconstructions and MIPs were obtained to evaluate the vascular anatomy. Carotid stenosis measurements (when applicable) are obtained utilizing NASCET criteria, using the distal internal carotid diameter as the denominator. CONTRAST:  166mL OMNIPAQUE IOHEXOL 350 MG/ML SOLN COMPARISON:  Head CT from yesterday.  CTA head neck from 2 days ago FINDINGS: CT HEAD FINDINGS Brain: Better seen the likely non progressed left stratum infarct. No visible cortical infarct or hemorrhagic complication. No hydrocephalus Vascular: See below Skull: Negative Sinuses: Retention cyst appearance in the left maxillary sinus.  Orbits: No acute finding Review of the MIP images confirms the above findings CTA NECK FINDINGS Aortic arch: No acute finding.  Two vessel branching. Right carotid system: Vessels are smooth and widely patent Left carotid system: Vessels are smooth and widely patent. Vertebral arteries: No proximal subclavian stenosis. Symmetrically sized vertebral arteries that are smooth and widely patent to the dura. Skeleton: Negative Other neck: No acute finding.  Unilateral right thyroid gland. Upper chest: Enlarged heart, partially covered. Review of the MIP images confirms the above findings CTA HEAD FINDINGS Anterior circulation: Partially obscured siphons due to venous enhancement. No stenosis or irregularity is seen. Left MCA stenting, spanning M1 to M2 segments. At the mid stent there is nonocclusive luminal soft tissue density from the posterior wall. No downstream diminished flow. No contralateral embolism is seen. There is early branching of the right MCA. Posterior circulation: The vertebral and basilar arteries are diffusely patent. No branch occlusion, beading, or aneurysm. Venous sinuses: Diffusely patent Anatomic variants: None significant Review of the MIP images confirms the above findings IMPRESSION: 1. Left MCA stenting with nonocclusive thrombus seen along the posterior wall of the mid  stent. 2. No stenosis or embolic source seen in the neck. Electronically Signed   By: Monte Fantasia M.D.   On: 04/19/2020 07:38   CT HEAD WO CONTRAST  Result Date: 04/18/2020 CLINICAL DATA:  Stroke, follow-up. Intracranial stent placed yesterday. EXAM: CT HEAD WITHOUT CONTRAST TECHNIQUE: Contiguous axial images were obtained from the base of the skull through the vertex without intravenous contrast. COMPARISON:  Noncontrast head CT performed 04/17/2020 at 6:18 p.m. Noncontrast head CT, CT angiogram head/neck and CT perfusion performed earlier the same day 04/17/2020. FINDINGS: Brain: Previously demonstrated vague hyperdensity within the left basal ganglia, left insula and left frontoparietal cortex is no longer present. Abnormal hypodensity is again demonstrated within the posterior aspect of the left putamen suspicious for acute/early subacute infarction (series 3, image 16). No other definite loss of gray-white differentiation is identified. No evidence of acute intracranial hemorrhage. No extra-axial fluid collection. No evidence of intracranial mass. No midline shift. Vascular: Redemonstrated vascular stent in the region of the M1/M2 left middle cerebral artery. No hyperdense vessel. Skull: Normal. Negative for fracture or focal lesion. Sinuses/Orbits: Visualized orbits show no acute finding. Moderate-sized left maxillary sinus mucous retention cyst. No significant mastoid effusion. IMPRESSION: Previously demonstrated vague hyperdensity within the left basal ganglia, left insula and left frontoparietal cortex is no longer present and likely reflected contrast staining. Redemonstrated abnormal hypodensity within the posterior aspect of the left putamen suspicious for acute/early subacute infarction. No other definite loss of gray-white differentiation is identified. No acute intracranial hemorrhage. Redemonstrated vascular stent in the region of the M1/M2 left MCA. Electronically Signed   By: Kellie Simmering  DO   On: 04/18/2020 10:02   CT ANGIO NECK W OR WO CONTRAST  Result Date: 04/19/2020 CLINICAL DATA:  Mechanical thrombectomy with stent placement 2 days ago. Follow-up EXAM: CT ANGIOGRAPHY HEAD AND NECK TECHNIQUE: Multidetector CT imaging of the head and neck was performed using the standard protocol during bolus administration of intravenous contrast. Multiplanar CT image reconstructions and MIPs were obtained to evaluate the vascular anatomy. Carotid stenosis measurements (when applicable) are obtained utilizing NASCET criteria, using the distal internal carotid diameter as the denominator. CONTRAST:  142mL OMNIPAQUE IOHEXOL 350 MG/ML SOLN COMPARISON:  Head CT from yesterday.  CTA head neck from 2 days ago FINDINGS: CT HEAD FINDINGS Brain: Better seen the likely non progressed left  stratum infarct. No visible cortical infarct or hemorrhagic complication. No hydrocephalus Vascular: See below Skull: Negative Sinuses: Retention cyst appearance in the left maxillary sinus. Orbits: No acute finding Review of the MIP images confirms the above findings CTA NECK FINDINGS Aortic arch: No acute finding.  Two vessel branching. Right carotid system: Vessels are smooth and widely patent Left carotid system: Vessels are smooth and widely patent. Vertebral arteries: No proximal subclavian stenosis. Symmetrically sized vertebral arteries that are smooth and widely patent to the dura. Skeleton: Negative Other neck: No acute finding.  Unilateral right thyroid gland. Upper chest: Enlarged heart, partially covered. Review of the MIP images confirms the above findings CTA HEAD FINDINGS Anterior circulation: Partially obscured siphons due to venous enhancement. No stenosis or irregularity is seen. Left MCA stenting, spanning M1 to M2 segments. At the mid stent there is nonocclusive luminal soft tissue density from the posterior wall. No downstream diminished flow. No contralateral embolism is seen. There is early branching of the  right MCA. Posterior circulation: The vertebral and basilar arteries are diffusely patent. No branch occlusion, beading, or aneurysm. Venous sinuses: Diffusely patent Anatomic variants: None significant Review of the MIP images confirms the above findings IMPRESSION: 1. Left MCA stenting with nonocclusive thrombus seen along the posterior wall of the mid stent. 2. No stenosis or embolic source seen in the neck. Electronically Signed   By: Monte Fantasia M.D.   On: 04/19/2020 07:38   DG CHEST PORT 1 VIEW  Result Date: 04/18/2020 CLINICAL DATA:  Respiratory failure, ventilatory support EXAM: PORTABLE CHEST 1 VIEW COMPARISON:  04/17/2020 FINDINGS: Endotracheal tube 4 cm above the carina. Left subclavian pacemaker/defibrillator noted. NG tube enters the stomach with the tip not visualized. Stable cardiomegaly with prominent central vascularity. Dense left lower lobe collapse/consolidation, slightly worse. Difficult to exclude developing left effusion. No significant pneumothorax. IMPRESSION: Increased left lower lobe retrocardiac collapse/consolidation. Stable cardiomegaly and vascular congestion Electronically Signed   By: Jerilynn Mages.  Shick M.D.   On: 04/18/2020 09:41    PHYSICAL EXAM  Constitution: NAD, supine in bed Eyes: EOM intact, PERRLA Cardio: regular rate & rhythm Respiratory: non-labored breathing, on room air Neuro: Mental Status: Patient is awake, alert and oriented x 3, answering questions and able to follow commands  Cranial Nerves: II: visual fields intact  III,IV, VI: EOMI without ptosis or diploplia. Pupils equal, round and reactive to light V: facial sensation intact VII: smile symmetric, no facial droop VIII: hearing is intact to voice XI: Shoulder shrug is symmetric. XII: tongue midline, no fasculations  Motor: right: strength 5/5 and symmetric, no upper or lower extremity drift  left: strength 5/5, no upper or lower extremity drift   Sensory: Sensation  intact Cerebellar: Normal finger to nose  Skin: right femoral site c/d/i   ASSESSMENT/PLAN [name] is a 49 y.o. 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   CT perfusion with 64 cc delay in left MCA territory and 0cc CBF and collateral flow to left MCA branches. Patient went for intervention with thrombectomy and stent placement of left M1 occlusion.  Post-stent CT yesterday 10/27 shows continued mild hypodensity of left putamen without other loss of grey-white differentiation or hemorrhage with stent at M1/M2 left MCA  CTA today 10/28 with left MCA stent with nonocclusive thrombus along the posterior wall of the mid stent  Goal bp systolic <469 from neurological standpoint. Ok to discontinue arterial line. BP cuff readings likely inaccurate with systolic listed low 629, arterial line 528 systolic with good waveform   Unable to obtain MRI/MRA due to ICD   2D Echo without thrombus, EF 20-25% and global hypokinesis, severely dilated LV w/grade III diastolic dysfunction. RVP 38 mmHg, decreased IVC respiratory variability, EF similar to prior 09/27/19  Will follow-up with cardiology for ICD interrogation    LDL 127  HgbA1c 5.3  VTE prophylaxis - lovenox  Continue PT/OT     Diet   Diet Heart Room service appropriate? Yes with Assist; Fluid consistency: Thin    Therapy recommendations:  CIR  Disposition:  Stable for transfer to floor from neurological standpoint  With EF 20-25% continue brilinta 90  mg bid and asa 81 mg, no anticoagulation indicated at this time unless atrial fibrillation present on ICD. No history of afib.   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 Currently holding BB and entresto, diuretics started yesterday    BP Stable  Hyperlipidemia  Home meds:  Atorvastatin 40 mg, resumed in hospital   LDL 127, goal < 70  Continue statin at discharge  Other Stroke Risk Factors  Substance abuse - UDS: negative  Obesity, Body mass index is 44.01 kg/m., recommend weight loss, diet and exercise as appropriate   Family hx stroke (Father)  Coronary artery disease, MI  Obstructive sleep apnea, recently diagnosed - on CPAP at home  Systolic ongestive heart failure  Other Active Problems  Hypothyroidism - on levothyroxine   OSA - CPAP ordered  04/19/2020 8:42 AM Stroke MD Note :  Patient is doing well.  Strength is improving.  CT angiogram shows patent left MCA stent with partial thrombus.  Echocardiogram shows ejection fraction 2025% but no definite clot..  ICD was interrogated and no definite A. fib was found.  Plan mobilize out of bed.  Therapy consults.  Transfer to neurology floor bed.  Likely discharge home in the next few days.  Continue antiplatelet t.herapy.  Long discussion with patient and husband at the bedside and answered questions.  Greater than 50% time during the 35-minute visit was spent on counseling and coordination of care about her embolic stroke and answering questions Antony Contras, MD To contact Stroke Continuity provider, please refer to [email or web address] After hours, contact General Neurology

## 2020-04-19 NOTE — Evaluation (Signed)
Speech Language Pathology Evaluation Patient Details Name: [name] MRN: [id number] DOB: 1970/11/02 Today's Date: 04/19/2020 Time: 0321-2248 SLP Time Calculation (min) (ACUTE ONLY): 13 min  Problem List:  Patient Active Problem List   Diagnosis Date Noted  . Stroke (cerebrum) ([place]) 04/17/2020  . Middle cerebral artery embolism, 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  . ICD (implantable cardioverter-defibrillator) discharge 09/27/2018  . Vitamin D deficiency 10/19/2017  . Insulin resistance 10/19/2017  . Hypotension 06/03/2016  . Volume depletion 06/03/2016  . Preoperative cardiovascular examination 10/25/2014  . Pulmonary hypertension ([place]) 02/07/2014  . Sprint Fidelis ICD lead RECALL  [id number]   . UARS (upper airway resistance syndrome) 09/04/2011  . Encounter for fitting or adjustment of implantable cardioverter-defibrillator (ICD)   . Obesity 03/12/2009  . CARDIOMYOPATHY, PRIMARY, DILATED 03/12/2009  . SYSTOLIC HEART FAILURE, CHRONIC 03/12/2009   Past Medical History:  Past Medical History:  Diagnosis Date  . Automatic implantable cardioverter-defibrillator in situ   . Chronic CHF (congestive heart failure) (HCC)    a. EF 15-20% b. RHC (09/2013) RA 14, RV 57/22, PA 64/36 (48), PCWP 18, FIck CO/CI 3.7/1.6, PVR 8.1 WU, PA sat 47%   . History of stomach ulcers   . Hypertension   . Hypotension   . Hypothyroidism   . Morbid obesity ([place])   . Myocardial infarction ([place]) 08/2013  . Nocturnal dyspnea   . Nonischemic cardiomyopathy ([place])   . Sinus tachycardia   . Snoring-prob OSA 09/04/2011  . Sprint Fidelis ICD lead RECALL  [id number]   . UARS (upper airway resistance syndrome) 09/04/2011   HST 12/2013:  AHI 4/hr (numerous episodes of airflow reduction that did not have concomitant desaturation)    Past Surgical History:  Past Surgical History:  Procedure Laterality Date  . BREATH TEK H PYLORI N/A 11/09/2014   Procedure: BREATH TEK H PYLORI;  Surgeon: Greer Pickerel, MD;  Location: [facility];  Service:  General;  Laterality: N/A;  . CARDIAC CATHETERIZATION  ~ 2006; 09/2013  . CARDIAC CATHETERIZATION N/A 05/23/2016   Procedure: Right Heart Cath;  Surgeon: Larey Dresser, MD;  Location: [facility];  Service: Cardiovascular;  Laterality: N/A;  . CARDIAC DEFIBRILLATOR PLACEMENT  2006; 12/26/2013   Medtronic [id number]; 12-2013 ICD gen change and RV lead revision with new 2500 RV lead by Dr [name]  . CESAREAN SECTION  1999  . IMPLANTABLE CARDIOVERTER DEFIBRILLATOR GENERATOR CHANGE N/A 12/26/2013   Procedure: IMPLANTABLE CARDIOVERTER DEFIBRILLATOR GENERATOR CHANGE;  Surgeon: Deboraha Sprang, MD;  Location: [facility] CATH LAB;  Service: Cardiovascular;  Laterality: N/A;  . IR CT HEAD LTD  04/17/2020  . IR CT HEAD LTD  04/17/2020  . IR INTRA CRAN STENT  04/17/2020  . IR PERCUTANEOUS ART THROMBECTOMY/INFUSION INTRACRANIAL INC DIAG ANGIO  04/17/2020  . LEAD REVISION N/A 12/26/2013   Procedure: LEAD REVISION;  Surgeon: Deboraha Sprang, MD;  Location: [facility] CATH LAB;  Service: Cardiovascular;  Laterality: N/A;  . RADIOLOGY WITH ANESTHESIA N/A 04/17/2020   Procedure: Code Stroke;  Surgeon: Luanne Bras, MD;  Location: [facility];  Service: Radiology;  Laterality: N/A;  . RIGHT HEART CATHETERIZATION N/A 02/15/2014   Procedure: RIGHT HEART CATH;  Surgeon: Larey Dresser, MD;  Location: [facility] CATH LAB;  Service: Cardiovascular;  Laterality: N/A;  . TUBAL LIGATION  1999   HPI:  LUCIELLE VOKES is a 49 y.o. female with a history of CHF with an EF of 15 to 20% who presented with right-sided weakness. CT head revealed no acute intracranial hemorrhage, redemonstrated abnormal  hypodensity within the posterior aspect of   Assessment / Plan / Recommendation Clinical Impression  Pt was seen for SLE and was alert and cooperative. She received a bedside aphasia score of 60/60 on the Western Aphasia Battery-Bedside. She demonstrated mild word-finding difficulties during conversation, and the pt and spouse report that this is  their primary concern. All other areas were observed to be WNL for the tasks assessed. SLP educated pt and spouse on compensatory strategies for word-finding and outpatient speech therapy. No SLP f/u needed acutely.     SLP Assessment  SLP Recommendation/Assessment: Patient does not need any further Speech Lanaguage Pathology Services SLP Visit Diagnosis: Aphasia (R47.01)    Follow Up Recommendations  Outpatient SLP    Frequency and Duration           SLP Evaluation Cognition  Overall Cognitive Status: Impaired/Different from baseline Arousal/Alertness: Awake/alert Orientation Level: Oriented X4 Attention: Focused;Sustained Focused Attention: Appears intact Sustained Attention: Appears intact Memory: Appears intact Awareness: Appears intact Problem Solving: Appears intact Safety/Judgment: Appears intact       Comprehension  Auditory Comprehension Overall Auditory Comprehension: Appears within functional limits for tasks assessed Yes/No Questions: Within Functional Limits Commands: Within Functional Limits Conversation: Simple Visual Recognition/Discrimination Discrimination: Not tested Reading Comprehension Reading Status: Not tested    Expression Expression Primary Mode of Expression: Verbal Verbal Expression Overall Verbal Expression: Impaired Initiation: No impairment Automatic Speech: Name;Social Response Repetition: No impairment Naming: Impairment Confrontation: Within functional limits Pragmatics: No impairment Written Expression Dominant Hand: Right Written Expression: Within Functional Limits   Oral / Motor  Motor Speech Overall Motor Speech: Appears within functional limits for tasks assessed Respiration: Within functional limits Phonation: Normal Resonance: Within functional limits Articulation: Within functional limitis Intelligibility: Intelligible Motor Planning: Not tested Motor Speech Errors: Not applicable   GO                     Greggory Keen 04/19/2020, 10:20 AM

## 2020-04-19 NOTE — Progress Notes (Addendum)
Advanced Heart Failure Rounding Note  PCP-Cardiologist: [name], MD   Subjective:   Yesterday extubated and now on. Torsemide was started at 80 mg daily.   10/27 Device interrogation - no events. Fluid index well above threshold. Impedance dow. Suggestive of fluid accumulation.   Denies SOB.    Objective:   Weight Range: 114.5 kg Body mass index is 44.01 kg/m.   Vital Signs:   Temp:  [98.1 F (36.7 C)-98.4 F (36.9 C)] 98.2 F (36.8 C) (10/28 0800) Pulse Rate:  [45-109] 87 (10/28 0600) Resp:  [14-21] 17 (10/28 0600) BP: (57-113)/(36-77) 70/42 (10/28 0530) SpO2:  [95 %-100 %] 99 % (10/28 0600) Arterial Line BP: (99-139)/(56-91) 128/73 (10/28 0600) Last BM Date:  (PTA)  Weight change: Filed Weights   04/17/20 0900  Weight: 114.5 kg    Intake/Output:   Intake/Output Summary (Last 24 hours) at 04/19/2020 1058 Last data filed at 04/19/2020 0400 Gross per 24 hour  Intake 173.13 ml  Output 950 ml  Net -776.87 ml      Physical Exam  General:   No resp difficulty HEENT: normal Neck: supple. JVP 9-10 . Carotids 2+ bilat; no bruits. No lymphadenopathy or thryomegaly appreciated. Cor: PMI nondisplaced. Regular rate & rhythm. No rubs, gallops or murmurs. Lungs: clear Abdomen: obese, soft, nontender, nondistended. No hepatosplenomegaly. No bruits or masses. Good bowel sounds. Extremities: no cyanosis, clubbing, rash, R and LLE trace edema Neuro: alert & orientedx3, cranial nerves grossly intact. moves all 4 extremities w/o difficulty. Affect pleasant   Telemetry  SR 80s  With frequent PVCs and brief NSVT   EKG   n/a  Labs    CBC Recent Labs    04/17/20 0949 04/17/20 1000 04/17/20 1958 04/18/20 0950  WBC 9.1   < > 14.5* 16.0*  NEUTROABS 6.6  --   --  13.0*  HGB 11.2*   < > 11.2* 9.6*  HCT 38.0   < > 36.6 32.5*  MCV 82.1   < > 79.9* 80.2  PLT 304   < > 421* 294   < > = values in this interval not displayed.   Basic Metabolic Panel Recent  Labs    04/17/20 0949 04/17/20 0949 04/17/20 1000 04/17/20 1000 04/17/20 1630 04/17/20 1958 04/18/20 0950  NA 138   < > 140   < > 141  --  142  K 3.8   < > 3.8   < > 4.0  --  3.5  CL 106   < > 107  --   --   --  115*  CO2 22  --   --   --   --   --  19*  GLUCOSE 106*   < > 106*  --   --   --  91  BUN 16   < > 17  --   --   --  9  CREATININE 1.28*   < > 1.20*  --   --   --  0.89  CALCIUM 9.0  --   --   --   --   --  8.2*  MG  --   --   --   --   --  1.7 1.6*  PHOS  --   --   --   --   --   --  2.8   < > = values in this interval not displayed.   Liver Function Tests Recent Labs    04/17/20 0949 04/18/20 0950  AST 18  --   ALT 14  --   ALKPHOS 54  --   BILITOT 0.6  --   PROT 6.9  --   ALBUMIN 3.4* 2.8*   No results for input(s): LIPASE, AMYLASE in the last 72 hours. Cardiac Enzymes No results for input(s): CKTOTAL, CKMB, CKMBINDEX, TROPONINI in the last 72 hours.  BNP: BNP (last 3 results) Recent Labs    01/26/20 1030  BNP 929.8*    ProBNP (last 3 results) No results for input(s): PROBNP in the last 8760 hours.   D-Dimer No results for input(s): DDIMER in the last 72 hours. Hemoglobin A1C Recent Labs    04/18/20 0950  HGBA1C 5.5   Fasting Lipid Panel Recent Labs    04/18/20 0950  CHOL 198  HDL 50  LDLCALC 130*  TRIG 92  CHOLHDL 4.0   Thyroid Function Tests No results for input(s): TSH, T4TOTAL, T3FREE, THYROIDAB in the last 72 hours.  Invalid input(s): FREET3  Other results:   Imaging    CT ANGIO HEAD W OR WO CONTRAST  Result Date: 04/19/2020 CLINICAL DATA:  Mechanical thrombectomy with stent placement 2 days ago. Follow-up EXAM: CT ANGIOGRAPHY HEAD AND NECK TECHNIQUE: Multidetector CT imaging of the head and neck was performed using the standard protocol during bolus administration of intravenous contrast. Multiplanar CT image reconstructions and MIPs were obtained to evaluate the vascular anatomy. Carotid stenosis measurements (when  applicable) are obtained utilizing NASCET criteria, using the distal internal carotid diameter as the denominator. CONTRAST:  167mL OMNIPAQUE IOHEXOL 350 MG/ML SOLN COMPARISON:  Head CT from yesterday.  CTA head neck from 2 days ago FINDINGS: CT HEAD FINDINGS Brain: Better seen the likely non progressed left stratum infarct. No visible cortical infarct or hemorrhagic complication. No hydrocephalus Vascular: See below Skull: Negative Sinuses: Retention cyst appearance in the left maxillary sinus. Orbits: No acute finding Review of the MIP images confirms the above findings CTA NECK FINDINGS Aortic arch: No acute finding.  Two vessel branching. Right carotid system: Vessels are smooth and widely patent Left carotid system: Vessels are smooth and widely patent. Vertebral arteries: No proximal subclavian stenosis. Symmetrically sized vertebral arteries that are smooth and widely patent to the dura. Skeleton: Negative Other neck: No acute finding.  Unilateral right thyroid gland. Upper chest: Enlarged heart, partially covered. Review of the MIP images confirms the above findings CTA HEAD FINDINGS Anterior circulation: Partially obscured siphons due to venous enhancement. No stenosis or irregularity is seen. Left MCA stenting, spanning M1 to M2 segments. At the mid stent there is nonocclusive luminal soft tissue density from the posterior wall. No downstream diminished flow. No contralateral embolism is seen. There is early branching of the right MCA. Posterior circulation: The vertebral and basilar arteries are diffusely patent. No branch occlusion, beading, or aneurysm. Venous sinuses: Diffusely patent Anatomic variants: None significant Review of the MIP images confirms the above findings IMPRESSION: 1. Left MCA stenting with nonocclusive thrombus seen along the posterior wall of the mid stent. 2. No stenosis or embolic source seen in the neck. Electronically Signed   By: Monte Fantasia M.D.   On: 04/19/2020 07:38    CT ANGIO NECK W OR WO CONTRAST  Result Date: 04/19/2020 CLINICAL DATA:  Mechanical thrombectomy with stent placement 2 days ago. Follow-up EXAM: CT ANGIOGRAPHY HEAD AND NECK TECHNIQUE: Multidetector CT imaging of the head and neck was performed using the standard protocol during bolus administration of intravenous contrast. Multiplanar CT image reconstructions and MIPs were  obtained to evaluate the vascular anatomy. Carotid stenosis measurements (when applicable) are obtained utilizing NASCET criteria, using the distal internal carotid diameter as the denominator. CONTRAST:  130mL OMNIPAQUE IOHEXOL 350 MG/ML SOLN COMPARISON:  Head CT from yesterday.  CTA head neck from 2 days ago FINDINGS: CT HEAD FINDINGS Brain: Better seen the likely non progressed left stratum infarct. No visible cortical infarct or hemorrhagic complication. No hydrocephalus Vascular: See below Skull: Negative Sinuses: Retention cyst appearance in the left maxillary sinus. Orbits: No acute finding Review of the MIP images confirms the above findings CTA NECK FINDINGS Aortic arch: No acute finding.  Two vessel branching. Right carotid system: Vessels are smooth and widely patent Left carotid system: Vessels are smooth and widely patent. Vertebral arteries: No proximal subclavian stenosis. Symmetrically sized vertebral arteries that are smooth and widely patent to the dura. Skeleton: Negative Other neck: No acute finding.  Unilateral right thyroid gland. Upper chest: Enlarged heart, partially covered. Review of the MIP images confirms the above findings CTA HEAD FINDINGS Anterior circulation: Partially obscured siphons due to venous enhancement. No stenosis or irregularity is seen. Left MCA stenting, spanning M1 to M2 segments. At the mid stent there is nonocclusive luminal soft tissue density from the posterior wall. No downstream diminished flow. No contralateral embolism is seen. There is early branching of the right MCA. Posterior  circulation: The vertebral and basilar arteries are diffusely patent. No branch occlusion, beading, or aneurysm. Venous sinuses: Diffusely patent Anatomic variants: None significant Review of the MIP images confirms the above findings IMPRESSION: 1. Left MCA stenting with nonocclusive thrombus seen along the posterior wall of the mid stent. 2. No stenosis or embolic source seen in the neck. Electronically Signed   By: Monte Fantasia M.D.   On: 04/19/2020 07:38   DG CHEST PORT 1 VIEW  Result Date: 04/19/2020 CLINICAL DATA:  Stroke. EXAM: PORTABLE CHEST 1 VIEW COMPARISON:  April 18, 2020. FINDINGS: Moderate cardiomegaly is again noted. Left-sided pacemaker is unchanged in position. Endotracheal and nasogastric tubes have been removed. No pneumothorax or pleural effusion is noted. Both lungs are clear. The visualized skeletal structures are unremarkable. IMPRESSION: Stable cardiomegaly. No acute cardiopulmonary abnormality seen. Endotracheal and nasogastric tubes have been removed. Electronically Signed   By: Marijo Conception M.D.   On: 04/19/2020 09:28     Medications:     Scheduled Medications: .  stroke: mapping our early stages of recovery book   Does not apply Once  . amiodarone  200 mg Oral Daily  . aspirin  81 mg Oral Daily  . atorvastatin  40 mg Oral Daily  . chlorhexidine  15 mL Mouth Rinse BID  . Chlorhexidine Gluconate Cloth  6 each Topical Daily  . digoxin  0.125 mg Oral Daily  . enoxaparin (LOVENOX) injection  40 mg Subcutaneous Q24H  . levothyroxine  25 mcg Oral QAC breakfast  . mouth rinse  15 mL Mouth Rinse q12n4p  . sodium chloride flush  3 mL Intravenous Once  . spironolactone  25 mg Oral Daily  . ticagrelor  90 mg Oral BID  . torsemide  80 mg Oral Daily    Infusions: . sodium chloride Stopped (04/18/20 0953)  . sodium chloride    . phenylephrine (NEO-SYNEPHRINE) Adult infusion Stopped (04/18/20 1316)  . 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  Admitted with LMCA stroke--cardioembolic with heart failure.   Assessment/Plan  1. 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  - IR performed S/P  Thrombectomy with stent.  - On asa + brillinta.   2. Chronic Systolic HF NICM dating back to 2015. Possible Peripartum. Had Med[employer]onic ICD ECHO 04/17/20  EF ~20% RV ok, no LV thrombus visualized.  Hold bb, arni, SGT2i with stroke.  -Volume status was up on Optivol. Torsemide 80 mg daily started 10/27.  Continue .  - Continue spiro 25 mg daily - Check BMET and Mag    3. H/O VT /PVCs High PVC burden. Increase amio 200 mg twice a day - Check BMET/Mag today.  - No VT on device interrogation.   4.  Hypothyroidism On levothyroxine   5. OSA Recently diagnosed.  Length of Stay: 2  Darrick Grinder, NP  04/19/2020, 10:58 AM  Advanced Heart Failure Team Pager [phone] (M-F; 7a - 4p)  Please contact [facility] for night-coverage after hours (4p -7a ) and weekends on [email or web address]  Agree with the above note.   Difficult to obtain accurate BP measurement by cuff, using arterial line readings.    General: NAD Neck: JVP 10 cm, no thyromegaly or thyroid nodule.  Lungs: Clear to auscultation bilaterally with normal respiratory effort. CV: Lateral PMI.  Heart regular S1/S2, no S3/S4, no murmur.  No peripheral edema.   Abdomen: Soft, nontender, no hepatosplenomegaly, no distention.  Skin: Intact without lesions or rashes.  Neurologic: Alert and oriented x 3. Mild left-sided weakness.  Psych: Normal affect. Extremities: No clubbing or cyanosis.  HEENT: Normal.     S/p L MCA CVA, now on ASA 99 + Brilinta s/p thrombectomy/stenting.  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  No history of atrial fibrillation.  Question for neurology here will be her long-term antiplatelet/anticoagulant regimen => think she would ideally be on warfarin but at this time needs ASA/Brilinta.  Neuro note today appears to recommend ASA/Brilinta and no anticoagulation.  Left side seems less weak today.   Volume overload by exam and device interrogation.  - Restart home torsemide 80 qam/40 qpm.  - Continue spironolactone 25 daily.  - Restart Farxiga 10 daily.  - Restart Coreg at 3.125 mg bid.  - Continue digoxin.   H/o VT/VF, continue amiodarone. Will increase to bid for now with frequent PVCs.   [name] 04/19/2020 2:31 PM

## 2020-04-19 NOTE — Progress Notes (Signed)
Retrocardiac infiltrate cleared up on chest x-ray  She is clinically stable  Heart failure being followed by heart failure team  Critical care medicine will sign off  Patient will be transferred to progressive floor

## 2020-04-19 NOTE — Progress Notes (Signed)
Referring Physician(s): Code stroke- Greta Doom (neurology)  Supervising Physician: Luanne Bras  Patient Status:  [facility] - In-pt  Chief Complaint:  F/u Stroke with cerebral intervention  Brief History: [name] is a 49 y.o. 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  She presented to the ED on 100/26/21 with right sided weakness which progressed to paralysis.  She was outside tPa window.  She underwent thrombectomy of left M1 occlusion with stent placement by Dr. Estanislado Pandy.  Subjective:  She is lying in bed. Husband at bedside. No complaints.  Allergies: Patient has no known allergies.  Medications: Prior to Admission medications   Medication Sig Start Date End Date Taking? Authorizing Provider  amiodarone (PACERONE) 200 MG tablet Take 1 tablet (200 mg total) by mouth daily. 03/15/19  Yes Larey Dresser, MD  atorvastatin (LIPITOR) 40 MG tablet TAKE 1 TABLET BY MOUTH ONCE A DAY Patient taking differently: Take 40 mg by mouth daily.  02/06/20  Yes Larey Dresser, MD  carvedilol (COREG) 25 MG tablet TAKE 1 TABLET BY MOUTH TWICE A DAY WITH A MEAL Patient taking differently: Take 25 mg by mouth 2 (two) times daily with a meal.  03/15/19  Yes Larey Dresser, MD  CORLANOR 5 MG TABS tablet TAKE 1 TABLET BY MOUTH TWICE (2) DAILY WITH MEALS Patient taking differently: Take 5 mg by mouth 2 (two) times daily with a meal.  06/14/19  Yes Larey Dresser, MD  dapagliflozin propanediol (FARXIGA) 10 MG TABS tablet Take 10 mg by mouth daily before breakfast. 09/27/19  Yes Larey Dresser, MD  digoxin (LANOXIN) 0.125 MG tablet TAKE 1 TABLET BY MOUTH ONCE A DAY Patient taking differently: Take 0.125 mg by mouth daily.  03/05/20  Yes Larey Dresser, MD  ENTRESTO 49-51 MG TAKE 1 TABLET BY MOUTH TWICE (2) DAILY Patient taking differently: Take 1  tablet by mouth 2 (two) times daily.  02/06/20  Yes Larey Dresser, MD  spironolactone (ALDACTONE) 25 MG tablet TAKE 1 TABLET BY MOUTH EVERY NIGHT AT BEDTIME Patient taking differently: Take 25 mg by mouth at bedtime.  10/06/19  Yes Larey Dresser, MD  torsemide (DEMADEX) 20 MG tablet Take 4 tablets (80 mg total) by mouth 2 (two) times daily for 3 days, THEN 4 tablets (80 mg total) in the morning for 30 days, THEN 3 tablets (60 mg total) every evening. Patient taking differently: Take 4 tablets (80mg ) in the morning and 2 tablets (40mg ) in the evening. 01/30/20 04/17/20 Yes Simmons, Brittainy M, PA-C  levothyroxine (SYNTHROID) 25 MCG tablet Take 1 tablet (25 mcg total) by mouth daily before breakfast. 09/23/18   Deboraha Sprang, MD  metolazone (ZAROXOLYN) 2.5 MG tablet Take 1 tablet (2.5 mg total) by mouth as needed (extra fluid as directed by HF clinic). 02/23/20   Lyda Jester M, PA-C  potassium chloride SA (KLOR-CON) 20 MEQ tablet Take 2 tablets (40 mEq total) by mouth 2 (two) times daily. Patient taking differently: Take 40 mEq by mouth 2 (two) times daily. Taking 2 tab in the morning and 2 tab at night 02/01/20   Lyda Jester M, PA-C  Vitamin D, Ergocalciferol, (DRISDOL) 1.25 MG (50000 UNIT) CAPS capsule Take 1 capsule (50,000 Units total) by mouth every 7 (seven) days. 01/17/20   Abby Potash, PA-C     Vital Signs: BP (!) 70/42   Pulse 87  Temp 98.1 F (36.7 C) (Oral)   Resp 17   Ht 5' 3.5" (1.613 m)   Wt 114.5 kg   SpO2 99%   BMI 44.01 kg/m   Physical Exam Vitals reviewed.  Constitutional:      Appearance: Normal appearance.  Cardiovascular:     Rate and Rhythm: Normal rate.  Pulmonary:     Effort: Pulmonary effort is normal. No respiratory distress.  Abdominal:     Palpations: Abdomen is soft.  Neurological:     Comments: Alert, awake, and oriented x3. Speech and comprehension intact. PERRL bilaterally. EOMs intact bilaterally without nystagmus or subjective  diplopia. Visual fields intact bilaterally. No facial asymmetry. Tongue midline. Motor power symmetric proportional to effort. No pronator drift. Fine motor and coordination intact and symmetric. 5/5 Strength bilaterally all fours Common femoral artery puncture site looks good, no bleeding, no hematoma, no pseudoaneurysm Gait not assessed. Romberg not assessed. Heel to toe not assessed.   Psychiatric:        Mood and Affect: Mood normal.        Behavior: Behavior normal.        Thought Content: Thought content normal.        Judgment: Judgment normal.      Imaging: CT ANGIO HEAD W OR WO CONTRAST  Result Date: 04/19/2020 CLINICAL DATA:  Mechanical thrombectomy with stent placement 2 days ago. Follow-up EXAM: CT ANGIOGRAPHY HEAD AND NECK TECHNIQUE: Multidetector CT imaging of the head and neck was performed using the standard protocol during bolus administration of intravenous contrast. Multiplanar CT image reconstructions and MIPs were obtained to evaluate the vascular anatomy. Carotid stenosis measurements (when applicable) are obtained utilizing NASCET criteria, using the distal internal carotid diameter as the denominator. CONTRAST:  169mL OMNIPAQUE IOHEXOL 350 MG/ML SOLN COMPARISON:  Head CT from yesterday.  CTA head neck from 2 days ago FINDINGS: CT HEAD FINDINGS Brain: Better seen the likely non progressed left stratum infarct. No visible cortical infarct or hemorrhagic complication. No hydrocephalus Vascular: See below Skull: Negative Sinuses: Retention cyst appearance in the left maxillary sinus. Orbits: No acute finding Review of the MIP images confirms the above findings CTA NECK FINDINGS Aortic arch: No acute finding.  Two vessel branching. Right carotid system: Vessels are smooth and widely patent Left carotid system: Vessels are smooth and widely patent. Vertebral arteries: No proximal subclavian stenosis. Symmetrically sized vertebral arteries that are smooth and widely patent  to the dura. Skeleton: Negative Other neck: No acute finding.  Unilateral right thyroid gland. Upper chest: Enlarged heart, partially covered. Review of the MIP images confirms the above findings CTA HEAD FINDINGS Anterior circulation: Partially obscured siphons due to venous enhancement. No stenosis or irregularity is seen. Left MCA stenting, spanning M1 to M2 segments. At the mid stent there is nonocclusive luminal soft tissue density from the posterior wall. No downstream diminished flow. No contralateral embolism is seen. There is early branching of the right MCA. Posterior circulation: The vertebral and basilar arteries are diffusely patent. No branch occlusion, beading, or aneurysm. Venous sinuses: Diffusely patent Anatomic variants: None significant Review of the MIP images confirms the above findings IMPRESSION: 1. Left MCA stenting with nonocclusive thrombus seen along the posterior wall of the mid stent. 2. No stenosis or embolic source seen in the neck. Electronically Signed   By: Monte Fantasia M.D.   On: 04/19/2020 07:38   CT Code Stroke CTA Head W/WO contrast  Result Date: 04/17/2020 CLINICAL DATA:  Right-sided weakness. EXAM: CT ANGIOGRAPHY  HEAD AND NECK CT PERFUSION BRAIN TECHNIQUE: Multidetector CT imaging of the head and neck was performed using the standard protocol during bolus administration of intravenous contrast. Multiplanar CT image reconstructions and MIPs were obtained to evaluate the vascular anatomy. Carotid stenosis measurements (when applicable) are obtained utilizing NASCET criteria, using the distal internal carotid diameter as the denominator. Multiphase CT imaging of the brain was performed following IV bolus contrast injection. Subsequent parametric perfusion maps were calculated using RAPID software. CONTRAST:  146mL OMNIPAQUE IOHEXOL 350 MG/ML SOLN COMPARISON:  CT head 04/17/2020 FINDINGS: CTA NECK FINDINGS Aortic arch: On the initial scan, there is no arterial enhancement  due to early trigger of the machine. Patient was rescanned immediately after the initial scan with satisfactory opacification of the intracranial arterial system. The neck was not re-scanned to evaluate the carotid and vertebral arteries in the neck. Right carotid system: Not evaluated due to lack of arterial contrast. Left carotid system: Not evaluated due to lack of arterial contrast. Vertebral arteries: Not evaluated. Skeleton: No acute abnormality. Other neck: Question left thyroidectomy. Right thyroid mildly enlarged without focal abnormality. No adenopathy in the neck. Upper chest: Mild hilar adenopathy bilaterally.  Lung apices clear. Review of the MIP images confirms the above findings CTA HEAD FINDINGS Anterior circulation: Internal carotid artery widely patent through the cavernous segment bilaterally. Occlusion left M1 segment proximally. There is collateral circulation with good opacification of left M2 and M3 branches. Both anterior cerebral arteries are patent without stenosis. Right middle cerebral artery widely patent without stenosis. Posterior circulation: Both vertebral arteries patent to the basilar. PICA patent bilaterally. Basilar widely patent. Superior cerebellar and posterior cerebral arteries patent bilaterally without stenosis or large vessel occlusion. Venous sinuses: Normal venous enhancement. Anatomic variants: None Review of the MIP images confirms the above findings CT Brain Perfusion Findings: ASPECTS: 9 CBF (<30%) Volume: 0ML Perfusion (Tmax>6.0s) volume: 1mL Mismatch Volume: 36mL Infarction Location:Left MCA territory involving the frontal and parietal lobe. IMPRESSION: 1. CT perfusion positive for 64 mL of delayed perfusion in the left MCA territory. No fixed infarct on perfusion. 2. Occlusion proximal left M1 segment with good distal collateral flow to the left MCA branches. 3. No other intracranial stenosis 4. Carotid vertebral arteries in the neck were not evaluated due to  lack of arterial opacification. The scan was performed early in the pulmonary arterial phase and subsequently immediately repeated with adequate intracranial arterial enhancement. 5. Images were reviewed with Dr. Leonel Ramsay following completion of the study Electronically Signed   By: Franchot Gallo M.D.   On: 04/17/2020 10:28   CT HEAD WO CONTRAST  Result Date: 04/18/2020 CLINICAL DATA:  Stroke, follow-up. Intracranial stent placed yesterday. EXAM: CT HEAD WITHOUT CONTRAST TECHNIQUE: Contiguous axial images were obtained from the base of the skull through the vertex without intravenous contrast. COMPARISON:  Noncontrast head CT performed 04/17/2020 at 6:18 p.m. Noncontrast head CT, CT angiogram head/neck and CT perfusion performed earlier the same day 04/17/2020. FINDINGS: Brain: Previously demonstrated vague hyperdensity within the left basal ganglia, left insula and left frontoparietal cortex is no longer present. Abnormal hypodensity is again demonstrated within the posterior aspect of the left putamen suspicious for acute/early subacute infarction (series 3, image 16). No other definite loss of gray-white differentiation is identified. No evidence of acute intracranial hemorrhage. No extra-axial fluid collection. No evidence of intracranial mass. No midline shift. Vascular: Redemonstrated vascular stent in the region of the M1/M2 left middle cerebral artery. No hyperdense vessel. Skull: Normal. Negative for fracture or  focal lesion. Sinuses/Orbits: Visualized orbits show no acute finding. Moderate-sized left maxillary sinus mucous retention cyst. No significant mastoid effusion. IMPRESSION: Previously demonstrated vague hyperdensity within the left basal ganglia, left insula and left frontoparietal cortex is no longer present and likely reflected contrast staining. Redemonstrated abnormal hypodensity within the posterior aspect of the left putamen suspicious for acute/early subacute infarction. No other  definite loss of gray-white differentiation is identified. No acute intracranial hemorrhage. Redemonstrated vascular stent in the region of the M1/M2 left MCA. Electronically Signed   By: Kellie Simmering DO   On: 04/18/2020 10:02   CT HEAD WO CONTRAST  Result Date: 04/17/2020 CLINICAL DATA:  Stroke, follow-up; follow-up. CT post Cangrelor infusion. EXAM: CT HEAD WITHOUT CONTRAST TECHNIQUE: Contiguous axial images were obtained from the base of the skull through the vertex without intravenous contrast. COMPARISON:  Noncontrast head CT, CT angiogram head/neck and CT perfusion performed earlier the same day 04/17/2020. FINDINGS: Brain: There is subtle hyperdensity within the left basal ganglia as well as within the left insular cortex and left frontoparietal cortex along the left sylvian fissure, suspected to reflect contrast staining at site of acute infarction. Gray-white differentiation is otherwise maintained. No frank parenchymal hemorrhage. No evidence of intracranial mass. No significant mass effect.  No midline shift. Vascular: A vascular stent is now present in the region of the M1/M2 left middle cerebral artery. Skull: Normal. Negative for fracture or focal lesion. Sinuses/Orbits: Visualized orbits show no acute finding. Mucous retention cyst within the left maxillary sinus. Trace scattered paranasal sinus mucosal thickening elsewhere. No significant mastoid effusion. Other: Partially visualized support tubes. IMPRESSION: Subtle hyperdensity is present within the left basal ganglia, as well as within the left insular cortex and left frontal parietal cortex along the left sylvian fissure. Findings are suspected to reflect contrast staining at site of acute infarction. Trace adjacent contrast extravasation/subarachnoid hemorrhage is difficult to exclude. Gray-white differentiation is otherwise maintained. No frank parenchymal hemorrhage. A vascular stent is now present in the region of the M1/M2 left middle  cerebral artery. Electronically Signed   By: Kellie Simmering DO   On: 04/17/2020 18:51   CT ANGIO NECK W OR WO CONTRAST  Result Date: 04/19/2020 CLINICAL DATA:  Mechanical thrombectomy with stent placement 2 days ago. Follow-up EXAM: CT ANGIOGRAPHY HEAD AND NECK TECHNIQUE: Multidetector CT imaging of the head and neck was performed using the standard protocol during bolus administration of intravenous contrast. Multiplanar CT image reconstructions and MIPs were obtained to evaluate the vascular anatomy. Carotid stenosis measurements (when applicable) are obtained utilizing NASCET criteria, using the distal internal carotid diameter as the denominator. CONTRAST:  166mL OMNIPAQUE IOHEXOL 350 MG/ML SOLN COMPARISON:  Head CT from yesterday.  CTA head neck from 2 days ago FINDINGS: CT HEAD FINDINGS Brain: Better seen the likely non progressed left stratum infarct. No visible cortical infarct or hemorrhagic complication. No hydrocephalus Vascular: See below Skull: Negative Sinuses: Retention cyst appearance in the left maxillary sinus. Orbits: No acute finding Review of the MIP images confirms the above findings CTA NECK FINDINGS Aortic arch: No acute finding.  Two vessel branching. Right carotid system: Vessels are smooth and widely patent Left carotid system: Vessels are smooth and widely patent. Vertebral arteries: No proximal subclavian stenosis. Symmetrically sized vertebral arteries that are smooth and widely patent to the dura. Skeleton: Negative Other neck: No acute finding.  Unilateral right thyroid gland. Upper chest: Enlarged heart, partially covered. Review of the MIP images confirms the above findings CTA HEAD FINDINGS  Anterior circulation: Partially obscured siphons due to venous enhancement. No stenosis or irregularity is seen. Left MCA stenting, spanning M1 to M2 segments. At the mid stent there is nonocclusive luminal soft tissue density from the posterior wall. No downstream diminished flow. No  contralateral embolism is seen. There is early branching of the right MCA. Posterior circulation: The vertebral and basilar arteries are diffusely patent. No branch occlusion, beading, or aneurysm. Venous sinuses: Diffusely patent Anatomic variants: None significant Review of the MIP images confirms the above findings IMPRESSION: 1. Left MCA stenting with nonocclusive thrombus seen along the posterior wall of the mid stent. 2. No stenosis or embolic source seen in the neck. Electronically Signed   By: Monte Fantasia M.D.   On: 04/19/2020 07:38   CT Code Stroke CTA Neck W/WO contrast  Result Date: 04/17/2020 CLINICAL DATA:  Right-sided weakness. EXAM: CT ANGIOGRAPHY HEAD AND NECK CT PERFUSION BRAIN TECHNIQUE: Multidetector CT imaging of the head and neck was performed using the standard protocol during bolus administration of intravenous contrast. Multiplanar CT image reconstructions and MIPs were obtained to evaluate the vascular anatomy. Carotid stenosis measurements (when applicable) are obtained utilizing NASCET criteria, using the distal internal carotid diameter as the denominator. Multiphase CT imaging of the brain was performed following IV bolus contrast injection. Subsequent parametric perfusion maps were calculated using RAPID software. CONTRAST:  15mL OMNIPAQUE IOHEXOL 350 MG/ML SOLN COMPARISON:  CT head 04/17/2020 FINDINGS: CTA NECK FINDINGS Aortic arch: On the initial scan, there is no arterial enhancement due to early trigger of the machine. Patient was rescanned immediately after the initial scan with satisfactory opacification of the intracranial arterial system. The neck was not re-scanned to evaluate the carotid and vertebral arteries in the neck. Right carotid system: Not evaluated due to lack of arterial contrast. Left carotid system: Not evaluated due to lack of arterial contrast. Vertebral arteries: Not evaluated. Skeleton: No acute abnormality. Other neck: Question left thyroidectomy.  Right thyroid mildly enlarged without focal abnormality. No adenopathy in the neck. Upper chest: Mild hilar adenopathy bilaterally.  Lung apices clear. Review of the MIP images confirms the above findings CTA HEAD FINDINGS Anterior circulation: Internal carotid artery widely patent through the cavernous segment bilaterally. Occlusion left M1 segment proximally. There is collateral circulation with good opacification of left M2 and M3 branches. Both anterior cerebral arteries are patent without stenosis. Right middle cerebral artery widely patent without stenosis. Posterior circulation: Both vertebral arteries patent to the basilar. PICA patent bilaterally. Basilar widely patent. Superior cerebellar and posterior cerebral arteries patent bilaterally without stenosis or large vessel occlusion. Venous sinuses: Normal venous enhancement. Anatomic variants: None Review of the MIP images confirms the above findings CT Brain Perfusion Findings: ASPECTS: 9 CBF (<30%) Volume: 0ML Perfusion (Tmax>6.0s) volume: 16mL Mismatch Volume: 7mL Infarction Location:Left MCA territory involving the frontal and parietal lobe. IMPRESSION: 1. CT perfusion positive for 64 mL of delayed perfusion in the left MCA territory. No fixed infarct on perfusion. 2. Occlusion proximal left M1 segment with good distal collateral flow to the left MCA branches. 3. No other intracranial stenosis 4. Carotid vertebral arteries in the neck were not evaluated due to lack of arterial opacification. The scan was performed early in the pulmonary arterial phase and subsequently immediately repeated with adequate intracranial arterial enhancement. 5. Images were reviewed with Dr. Leonel Ramsay following completion of the study Electronically Signed   By: Franchot Gallo M.D.   On: 04/17/2020 10:28   IR Intra Cran Stent  Result Date: 04/19/2020  INDICATION: New onset right-sided weakness, with slurred speech. Occluded left middle cerebral artery on CT angiogram  of the head and neck. EXAM: 1. EMERGENT LARGE VESSEL OCCLUSION THROMBOLYSIS anterior CIRCULATION) COMPARISON:  CT angiogram of the head and neck of April 17, 2020. MEDICATIONS: Ancef 2 g IV antibiotic was administered within 1 hour of the procedure. ANESTHESIA/SEDATION: General anesthesia CONTRAST:  Isovue 300 approximately 200 mL FLUOROSCOPY TIME:  Fluoroscopy Time: 105 minutes 18 seconds (4849 mGy). COMPLICATIONS: None immediate. TECHNIQUE: Following a full explanation of the procedure along with the potential associated complications, an informed witnessed consent was obtained. The risks of intracranial hemorrhage of 10%, worsening neurological deficit, ventilator dependency, death and inability to revascularize were all reviewed in detail with the patient's spouse The patient was then put under general anesthesia by the Department of Anesthesiology at [facility]. The right groin was prepped and draped in the usual sterile fashion. Thereafter using modified Seldinger technique, transfemoral access into the right common femoral artery was obtained without difficulty. Over a 0.035 inch guidewire an 8 French 25 cm Pinnacle sheath was inserted. Through this, and also over a 0.035 inch guidewire a 5 [place] JB 1 catheter was advanced to the aortic arch region and selectively positioned right common carotid artery. FINDINGS: The right common carotid arteriogram demonstrates the right external carotid artery and its major branches to be widely patent. The right internal carotid artery at the bulb to the cranial skull base is widely patent. The petrous, the cavernous and the supraclinoid segments are widely patent. The left middle cerebral artery demonstrates complete angiographic occlusion at its origin. The left anterior cerebral artery opacifies into the capillary and venous phases. Also noted is a left posterior communicating artery opacifying the left posterior cerebral artery distribution. PROCEDURE:  Balloon guide was then advanced to the distal left cervical ICA over a 0.035 inch Roadrunner guidewire. Through this, a combination of an 021 150 cm Trevo ProVue microcatheter inside an 071 Zoom aspiration catheter was advanced over a 0.014 inch standard Synchro micro guidewire with a J configuration to the supraclinoid left ICA. Micro guidewire was then gently manipulated and advanced without difficulty to the M2 region of the inferior division followed by the microcatheter. The guidewire was removed. Good aspiration obtained from the hub of the microcatheter. A gentle contrast injection demonstrated safe position of the tip of the microcatheter. A Tiger 21 retrieval device was then advanced to the distal end of the microcatheter. This was then deployed in the usual manner by retracting the microcatheter. The proximal portion of the Tiger retrieval device was in the proximal occluded segment of the left middle cerebral artery. The aspiration catheter was then advanced to engage the proximal occlusion of the left middle cerebral artery. After approximately 2 minutes of aspiration and during which time the retrieval device was expanded and decreased in diameter, the combination of the retrieval device, the aspiration catheter, and the microcatheter were retrieved and removed. Proximal flow arrest was reversed. A control arteriogram performed through the balloon guide catheter in the left internal carotid artery demonstrates revascularization of the left middle cerebral M1 segment of the anterior temporal branch, and also the superior division achieving a TICI 2b revascularization. This prompted another pass using the combination of the 071 aspiration catheter with the 021 microcatheter advanced again into the occluded inferior division in the M2 M3 region. Again the micro guidewire was removed. Good aspiration obtained from the hub of the microcatheter. At this time, a 5 mm x  37 mm Embotrap retrieval device was then  advanced to the distal end of the microcatheter. This was then deployed in the usual manner with the proximal portion of the device in the proximal portion of the occlusion. The Zoom aspiration catheter was again engaged in the proximal portion of the clot. Continuous aspiration was then performed for approximately 2 minutes at the hub of the aspiration catheter, and also at the hub of the balloon guide with right proximal flow arrest with aspiration with a 60 mL syringe. Thereafter, the combination of the retrieval device, the microcatheter, and the aspiration catheter were retrieved and removed. Aspiration was reversed. 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 The next pass was made with a Solitaire X 4 x 40 mm, using the above combination as described above. Again there continued to be occlusion of the inferior division. The next stent was with a Zoom 35 aspiration catheter which was advanced inside of a 35 aspiration Zoom catheter and engaged at the proximal portion of the occluded inferior division. Aspiration was continued for approximately 2 minutes. Thereafter, the combination of the aspiration 35 catheter, and the 55 catheter were retrieved and removed. A control arteriogram performed continued to demonstrate occluded inferior division. The next pass was made with a 6.5 mm x 40 mm Embotrap retrieval device again engaged in the occluded inferior division. Following flow arrest, and aspiration through the 35 catheter and also the 55 aspiration catheter for approximately 2 minutes with proximal flow arrest and a 60 mL syringe at the hub of the balloon guide catheter, the combination was retrieved and removed. Arteriogram performed following reversal of flow arrest in the left internal carotid artery again demonstrated no change in the occluded inferior division. The next  pass was made with a 6 mm x 40 mm Solitaire X retrieval device again with proximal flow arrest, and constant aspiration with a 60 mL syringe at the hub of the balloon guide catheter in the left internal carotid artery, and the 55 aspiration catheter engaged in the proximal portion of the clot in the inferior division for approximately 2 minutes. 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 It was, therefore, elected to proceed with placement of a stent to open up the prominent inferior division. Prior to this, the patient was loaded with aspirin 81 mg, and Brilinta 180 mg via an orogastric tube. CT of the brain was performed which showed no evidence of intracranial hemorrhage. Through the balloon guide in left internal carotid artery, a combination of a Catalyst 115 cm guide catheter and an 021 Trevo ProVue microcatheter combination was advanced over a 0.014 inch Aristotle micro guidewire to the supraclinoid left ICA. The micro guidewire was then manipulated with a torque device and advanced through the occluded left MCA inferior division into the M2 M3 region followed by the microcatheter. The guidewire was removed. Good aspiration obtained from the hub of the microcatheter which was then connected to continuous heparinized saline infusion. Simultaneous arteriograms were performed through the microcatheter and also through the Catalyst guide catheter in the supraclinoid left ICA in order to engage the occluded segment of the inferior division. It was decided to place a 4.5 mm x 30 mm Atlas Neuroform stent. This was  then advanced to the distal end of the microcatheter. The O ring on the delivery microcatheter was loosened. The stent was then deployed by slow steady retrieval of the 021 microcatheter. Following the  delivery, a control arteriogram performed through the 5 [place] Catalyst guide catheter in the left internal carotid artery demonstrated excellent apposition and coverage at the proximal, and the distal end of the stent. Also noted now was a TICI 2c revascularization of the left middle cerebral artery. Prior to the placement of the stent, the patient was given a bolus dose of Cangrelor IV followed by a slow infusion for 4 hours. Control arteriogram performed at 10 and 20 minutes post placement of the stent continued to demonstrate wide patency of the stented segment of the inferior division of the left middle cerebral artery. The Catalyst guide catheter was retrieved and removed. Control arteriogram performed through the balloon guide now in the left common carotid artery demonstrated patency of the left internal carotid artery proximally and distally intracranially. There continued to be patency of the left middle cerebral artery with a TICI 2c revascularization. The left anterior cerebral artery continued to be widely patent as well. Also noted was free flow into the left posterior communicating artery and the left posterior cerebral artery distribution. Throughout the procedure, the patient's blood pressure and neurological status remain stable. No evidence of extravasation or dissection was seen. The 8 French balloon guide was removed. An 8 French Angio-Seal closure device was then placed for hemostasis in the right common femoral puncture site. Distal pulses continued to be Dopplerable in the feet bilaterally. CT of the brain demonstrated no evidence of intracranial hemorrhage, or mass effect or midline shift. Patient was left intubated on account of her medical condition, and low ejection fraction and transferred to the neuro ICU for post revascularization management. 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 PLAN: Follow-up in the clinic 4 weeks post discharge. Electronically Signed   By: Luanne Bras M.D.   On: 04/18/2020 09:51   IR CT [facility]  Result Date: 04/19/2020 INDICATION: New onset right-sided weakness, with slurred speech. 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 EXAM: 1. EMERGENT LARGE VESSEL OCCLUSION THROMBOLYSIS anterior CIRCULATION) COMPARISON:  CT angiogram of the head and neck of April 17, 2020. MEDICATIONS: Ancef 2 g IV antibiotic was administered within 1 hour of the procedure. ANESTHESIA/SEDATION: General anesthesia CONTRAST:  Isovue 300 approximately 200 mL FLUOROSCOPY TIME:  Fluoroscopy Time: 105 minutes 18 seconds (4849 mGy). COMPLICATIONS: None immediate. TECHNIQUE: Following a full explanation of the procedure along with the potential associated complications, an informed witnessed consent was obtained. The risks of intracranial hemorrhage of 10%, worsening neurological deficit, ventilator dependency, death and inability to revascularize were all reviewed in detail with the patient's spouse The patient was then put under general anesthesia by the Department of Anesthesiology at [facility]. The right groin was prepped and draped in the usual sterile fashion. Thereafter using modified Seldinger technique, transfemoral access into the right common femoral artery was obtained without  difficulty. Over a 0.035 inch guidewire an 8 French 25 cm Pinnacle sheath was inserted. Through this, and also over a 0.035 inch guidewire a 5 [place] JB 1 catheter was advanced to the aortic arch region and selectively positioned  right common carotid artery. FINDINGS: The right common carotid arteriogram demonstrates the right external carotid artery and its major branches to be widely patent. The right internal carotid artery at the bulb to the cranial skull base is widely patent. The petrous, the cavernous and the supraclinoid segments are widely patent. The left middle cerebral artery demonstrates complete angiographic occlusion at its origin. The left anterior cerebral artery opacifies into the capillary and venous phases. Also noted is a left posterior communicating artery opacifying the left posterior cerebral artery distribution. PROCEDURE: Balloon guide was then advanced to the distal left cervical ICA over a 0.035 inch Roadrunner guidewire. Through this, a combination of an 021 150 cm Trevo ProVue microcatheter inside an 071 Zoom aspiration catheter was advanced over a 0.014 inch standard Synchro micro guidewire with a J configuration to the supraclinoid left ICA. Micro guidewire was then gently manipulated and advanced without difficulty to the M2 region of the inferior division followed by the microcatheter. The guidewire was removed. Good aspiration obtained from the hub of the microcatheter. A gentle contrast injection demonstrated safe position of the tip of the microcatheter. A Tiger 21 retrieval device was then advanced to the distal end of the microcatheter. This was then deployed in the usual manner by retracting the microcatheter. The proximal portion of the Tiger retrieval device was in the proximal occluded segment of the left middle cerebral artery. The aspiration catheter was then advanced to engage the proximal occlusion of the left middle cerebral artery. After approximately 2 minutes of aspiration and during which time the retrieval device was expanded and decreased in diameter, the combination of the retrieval device, the aspiration catheter, and the microcatheter were retrieved and removed. Proximal flow arrest  was reversed. A control arteriogram performed through the balloon guide catheter in the left internal carotid artery demonstrates revascularization of the left middle cerebral M1 segment of the anterior temporal branch, and also the superior division achieving a TICI 2b revascularization. This prompted another pass using the combination of the 071 aspiration catheter with the 021 microcatheter advanced again into the occluded inferior division in the M2 M3 region. Again the micro guidewire was removed. Good aspiration obtained from the hub of the microcatheter. At this time, a 5 mm x 37 mm Embotrap retrieval device was then advanced to the distal end of the microcatheter. This was then deployed in the usual manner with the proximal portion of the device in the proximal portion of the occlusion. The Zoom aspiration catheter was again engaged in the proximal portion of the clot. Continuous aspiration was then performed for approximately 2 minutes at the hub of the aspiration catheter, and also at the hub of the balloon guide with right proximal flow arrest with aspiration with a 60 mL syringe. Thereafter, the combination of the retrieval device, the microcatheter, and the aspiration catheter were retrieved and removed. Aspiration was reversed. 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 The next pass was made with a Solitaire X 4 x 40 mm, using the above combination as described above. Again there continued to be occlusion of the inferior division. The next stent was with a Zoom 35  aspiration catheter which was advanced inside of a 35 aspiration Zoom catheter and engaged at the proximal portion of the occluded inferior division. Aspiration was continued for approximately 2 minutes. Thereafter, the combination of the aspiration 35 catheter, and the 55 catheter were retrieved and  removed. A control arteriogram performed continued to demonstrate occluded inferior division. The next pass was made with a 6.5 mm x 40 mm Embotrap retrieval device again engaged in the occluded inferior division. Following flow arrest, and aspiration through the 35 catheter and also the 55 aspiration catheter for approximately 2 minutes with proximal flow arrest and a 60 mL syringe at the hub of the balloon guide catheter, the combination was retrieved and removed. Arteriogram performed following reversal of flow arrest in the left internal carotid artery again demonstrated no change in the occluded inferior division. The next pass was made with a 6 mm x 40 mm Solitaire X retrieval device again with proximal flow arrest, and constant aspiration with a 60 mL syringe at the hub of the balloon guide catheter in the left internal carotid artery, and the 55 aspiration catheter engaged in the proximal portion of the clot in the inferior division for approximately 2 minutes. 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 It was, therefore, elected to proceed with placement of a stent to open up the prominent inferior division. Prior to this, the patient was loaded with aspirin 81 mg, and Brilinta 180 mg via an orogastric tube. CT of the brain was performed which showed no evidence of intracranial hemorrhage. Through the balloon guide in left internal carotid artery, a combination of a Catalyst 115 cm guide catheter and an 021 Trevo ProVue microcatheter combination was advanced over a 0.014 inch Aristotle micro guidewire to the supraclinoid left ICA. The micro guidewire was then manipulated with a torque device and advanced through the occluded left MCA inferior division into the M2 M3 region followed by the microcatheter. The  guidewire was removed. Good aspiration obtained from the hub of the microcatheter which was then connected to continuous heparinized saline infusion. Simultaneous arteriograms were performed through the microcatheter and also through the Catalyst guide catheter in the supraclinoid left ICA in order to engage the occluded segment of the inferior division. It was decided to place a 4.5 mm x 30 mm Atlas Neuroform stent. This was then advanced to the distal end of the microcatheter. The O ring on the delivery microcatheter was loosened. The stent was then deployed by slow steady retrieval of the 021 microcatheter. Following the delivery, a control arteriogram performed through the 5 [place] Catalyst guide catheter in the left internal carotid artery demonstrated excellent apposition and coverage at the proximal, and the distal end of the stent. Also noted now was a TICI 2c revascularization of the left middle cerebral artery. Prior to the placement of the stent, the patient was given a bolus dose of Cangrelor IV followed by a slow infusion for 4 hours. Control arteriogram performed at 10 and 20 minutes post placement of the stent continued to demonstrate wide patency of the stented segment of the inferior division of the left middle cerebral artery. The Catalyst guide catheter was retrieved and removed. Control arteriogram performed through the balloon guide now in the left common carotid artery demonstrated patency of the left internal carotid artery proximally and distally intracranially. There  continued to be patency of the left middle cerebral artery with a TICI 2c revascularization. The left anterior cerebral artery continued to be widely patent as well. Also noted was free flow into the left posterior communicating artery and the left posterior cerebral artery distribution. Throughout the procedure, the patient's blood pressure and neurological status remain stable. No evidence of extravasation or dissection was  seen. The 8 French balloon guide was removed. An 8 French Angio-Seal closure device was then placed for hemostasis in the right common femoral puncture site. Distal pulses continued to be Dopplerable in the feet bilaterally. CT of the brain demonstrated no evidence of intracranial hemorrhage, or mass effect or midline shift. Patient was left intubated on account of her medical condition, and low ejection fraction and transferred to the neuro ICU for post revascularization management. 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 PLAN: Follow-up in the clinic 4 weeks post discharge. Electronically Signed   By: Luanne Bras M.D.   On: 04/18/2020 09:51   IR CT Head Ltd  Result Date: 04/19/2020 INDICATION: New onset right-sided weakness, with slurred speech. 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 EXAM: 1. EMERGENT LARGE VESSEL OCCLUSION THROMBOLYSIS anterior CIRCULATION) COMPARISON:  CT angiogram of the head and neck of April 17, 2020. MEDICATIONS: Ancef 2 g IV antibiotic was administered within 1 hour of the procedure. ANESTHESIA/SEDATION: General anesthesia CONTRAST:  Isovue 300 approximately 200 mL FLUOROSCOPY TIME:  Fluoroscopy Time: 105 minutes 18 seconds (4849 mGy). COMPLICATIONS: None immediate. TECHNIQUE: Following a full explanation of the procedure along with the potential associated complications, an informed witnessed consent was obtained. The risks of intracranial hemorrhage of 10%, worsening neurological deficit, ventilator  dependency, death and inability to revascularize were all reviewed in detail with the patient's spouse The patient was then put under general anesthesia by the Department of Anesthesiology at [facility]. The right groin was prepped and draped in the usual sterile fashion. Thereafter using modified Seldinger technique, transfemoral access into the right common femoral artery was obtained without difficulty. Over a 0.035 inch guidewire an 8 French 25 cm Pinnacle sheath was inserted. Through this, and also over a 0.035 inch guidewire a 5 [place] JB 1 catheter was advanced to the aortic arch region and selectively positioned right common carotid artery. FINDINGS: The right common carotid arteriogram demonstrates the right external carotid artery and its major branches to be widely patent. The right internal carotid artery at the bulb to the cranial skull base is widely patent. The petrous, the cavernous and the supraclinoid segments are widely patent. The left middle cerebral artery demonstrates complete angiographic occlusion at its origin. The left anterior cerebral artery opacifies into the capillary and venous phases. Also noted is a left posterior communicating artery opacifying the left posterior cerebral artery distribution. PROCEDURE: Balloon guide was then advanced to the distal left cervical ICA over a 0.035 inch Roadrunner guidewire. Through this, a combination of an 021 150 cm Trevo ProVue microcatheter inside an 071 Zoom aspiration catheter was advanced over a 0.014 inch  standard Synchro micro guidewire with a J configuration to the supraclinoid left ICA. Micro guidewire was then gently manipulated and advanced without difficulty to the M2 region of the inferior division followed by the microcatheter. The guidewire was removed. Good aspiration obtained from the hub of the microcatheter. A gentle contrast injection demonstrated safe position of the tip of the microcatheter. A Tiger 21 retrieval  device was then advanced to the distal end of the microcatheter. This was then deployed in the usual manner by retracting the microcatheter. The proximal portion of the Tiger retrieval device was in the proximal occluded segment of the left middle cerebral artery. The aspiration catheter was then advanced to engage the proximal occlusion of the left middle cerebral artery. After approximately 2 minutes of aspiration and during which time the retrieval device was expanded and decreased in diameter, the combination of the retrieval device, the aspiration catheter, and the microcatheter were retrieved and removed. Proximal flow arrest was reversed. A control arteriogram performed through the balloon guide catheter in the left internal carotid artery demonstrates revascularization of the left middle cerebral M1 segment of the anterior temporal branch, and also the superior division achieving a TICI 2b revascularization. This prompted another pass using the combination of the 071 aspiration catheter with the 021 microcatheter advanced again into the occluded inferior division in the M2 M3 region. Again the micro guidewire was removed. Good aspiration obtained from the hub of the microcatheter. At this time, a 5 mm x 37 mm Embotrap retrieval device was then advanced to the distal end of the microcatheter. This was then deployed in the usual manner with the proximal portion of the device in the proximal portion of the occlusion. The Zoom aspiration catheter was again engaged in the proximal portion of the clot. Continuous aspiration was then performed for approximately 2 minutes at the hub of the aspiration catheter, and also at the hub of the balloon guide with right proximal flow arrest with aspiration with a 60 mL syringe. Thereafter, the combination of the retrieval device, the microcatheter, and the aspiration catheter were retrieved and removed. Aspiration was reversed. 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 The next pass was made with a Solitaire X 4 x 40 mm, using the above combination as described above. Again there continued to be occlusion of the inferior division. The next stent was with a Zoom 35 aspiration catheter which was advanced inside of a 35 aspiration Zoom catheter and engaged at the proximal portion of the occluded inferior division. Aspiration was continued for approximately 2 minutes. Thereafter, the combination of the aspiration 35 catheter, and the 55 catheter were retrieved and removed. A control arteriogram performed continued to demonstrate occluded inferior division. The next pass was made with a 6.5 mm x 40 mm Embotrap retrieval device again engaged in the occluded inferior division. Following flow arrest, and aspiration through the 35 catheter and also the 55 aspiration catheter for approximately 2 minutes with proximal flow arrest and a 60 mL syringe at the hub of the balloon guide catheter, the combination was retrieved and removed. Arteriogram performed following reversal of flow arrest in the left internal carotid artery again demonstrated no change in the occluded inferior division. The next pass was made with a 6 mm x 40 mm Solitaire X retrieval device again with proximal flow arrest, and constant aspiration with a 60 mL syringe at  the hub of the balloon guide catheter in the left internal carotid artery, and the 55 aspiration catheter engaged in the proximal portion of the clot in the inferior division for approximately 2 minutes. 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 It was, therefore, elected to proceed with placement of a stent to open up the  prominent inferior division. Prior to this, the patient was loaded with aspirin 81 mg, and Brilinta 180 mg via an orogastric tube. CT of the brain was performed which showed no evidence of intracranial hemorrhage. Through the balloon guide in left internal carotid artery, a combination of a Catalyst 115 cm guide catheter and an 021 Trevo ProVue microcatheter combination was advanced over a 0.014 inch Aristotle micro guidewire to the supraclinoid left ICA. The micro guidewire was then manipulated with a torque device and advanced through the occluded left MCA inferior division into the M2 M3 region followed by the microcatheter. The guidewire was removed. Good aspiration obtained from the hub of the microcatheter which was then connected to continuous heparinized saline infusion. Simultaneous arteriograms were performed through the microcatheter and also through the Catalyst guide catheter in the supraclinoid left ICA in order to engage the occluded segment of the inferior division. It was decided to place a 4.5 mm x 30 mm Atlas Neuroform stent. This was then advanced to the distal end of the microcatheter. The O ring on the delivery microcatheter was loosened. The stent was then deployed by slow steady retrieval of the 021 microcatheter. Following the delivery, a control arteriogram performed through the 5 [place] Catalyst guide catheter in the left internal carotid artery demonstrated excellent apposition and coverage at the proximal, and the distal end of the stent. Also noted now was a TICI 2c revascularization of the left middle cerebral artery. Prior to the placement of the stent, the patient was given a bolus dose of Cangrelor IV followed by a slow infusion for 4 hours. Control arteriogram performed at 10 and 20 minutes post placement of the stent continued to demonstrate wide patency of the stented segment of the inferior division of the left middle cerebral artery. The Catalyst guide catheter was retrieved  and removed. Control arteriogram performed through the balloon guide now in the left common carotid artery demonstrated patency of the left internal carotid artery proximally and distally intracranially. There continued to be patency of the left middle cerebral artery with a TICI 2c revascularization. The left anterior cerebral artery continued to be widely patent as well. Also noted was free flow into the left posterior communicating artery and the left posterior cerebral artery distribution. Throughout the procedure, the patient's blood pressure and neurological status remain stable. No evidence of extravasation or dissection was seen. The 8 French balloon guide was removed. An 8 French Angio-Seal closure device was then placed for hemostasis in the right common femoral puncture site. Distal pulses continued to be Dopplerable in the feet bilaterally. CT of the brain demonstrated no evidence of intracranial hemorrhage, or mass effect or midline shift. Patient was left intubated on account of her medical condition, and low ejection fraction and transferred to the neuro ICU for post revascularization management. 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 PLAN: Follow-up in the clinic 4 weeks post discharge. Electronically Signed   By: Luanne Bras M.D.   On: 04/18/2020 09:51   CT Code Stroke Cerebral Perfusion with contrast  Result Date: 04/17/2020 CLINICAL DATA:  Right-sided weakness. EXAM: CT ANGIOGRAPHY HEAD AND NECK CT PERFUSION BRAIN TECHNIQUE: Multidetector CT imaging of  the head and neck was performed using the standard protocol during bolus administration of intravenous contrast. Multiplanar CT image reconstructions and MIPs were obtained to evaluate the vascular anatomy. Carotid stenosis measurements (when applicable) are obtained utilizing NASCET criteria, using the distal internal carotid diameter as the denominator. Multiphase CT imaging of the brain was performed following IV bolus contrast injection. Subsequent parametric perfusion maps were calculated using RAPID software. CONTRAST:  170mL OMNIPAQUE IOHEXOL 350 MG/ML SOLN COMPARISON:  CT head 04/17/2020 FINDINGS: CTA NECK FINDINGS Aortic arch: On the initial scan, there is no arterial enhancement due to early trigger of the machine. Patient was rescanned immediately after the initial scan with satisfactory opacification of the intracranial arterial system. The neck was not re-scanned to evaluate the carotid and vertebral arteries in the neck. Right carotid system: Not evaluated due to lack of arterial contrast. Left carotid system: Not evaluated due to lack of arterial contrast. Vertebral arteries: Not evaluated. Skeleton: No acute abnormality. Other neck: Question left thyroidectomy. Right thyroid mildly enlarged without focal abnormality. No adenopathy in the neck. Upper chest: Mild hilar adenopathy bilaterally.  Lung apices clear. Review of the MIP images confirms the above findings CTA HEAD FINDINGS Anterior circulation: Internal carotid artery widely patent through the cavernous segment bilaterally. Occlusion left M1 segment proximally. There is collateral circulation with good opacification of left M2 and M3 branches. Both anterior cerebral arteries are patent without stenosis. Right middle cerebral artery widely patent without stenosis. Posterior circulation: Both vertebral arteries patent to the basilar. PICA patent bilaterally. Basilar widely patent. Superior cerebellar and posterior cerebral arteries patent  bilaterally without stenosis or large vessel occlusion. Venous sinuses: Normal venous enhancement. Anatomic variants: None Review of the MIP images confirms the above findings CT Brain Perfusion Findings: ASPECTS: 9 CBF (<30%) Volume: 0ML Perfusion (Tmax>6.0s) volume: 50mL Mismatch Volume: 25mL Infarction Location:Left MCA territory involving the frontal and parietal lobe. IMPRESSION: 1. CT perfusion positive for 64 mL of delayed perfusion in the left MCA territory. No fixed infarct on perfusion. 2. Occlusion proximal left M1 segment with good distal collateral flow to the left MCA branches. 3. No other intracranial stenosis 4. Carotid vertebral arteries in the neck were not evaluated due to lack of arterial opacification. The scan was performed early in the pulmonary arterial phase and subsequently immediately repeated with adequate intracranial arterial enhancement. 5. Images were reviewed with Dr. Leonel Ramsay following completion of the study Electronically Signed   By: Franchot Gallo M.D.   On: 04/17/2020 10:28   DG CHEST PORT 1 VIEW  Result Date: 04/19/2020 CLINICAL DATA:  Stroke. EXAM: PORTABLE CHEST 1 VIEW COMPARISON:  April 18, 2020. FINDINGS: Moderate cardiomegaly is again noted. Left-sided pacemaker is unchanged in position. Endotracheal and nasogastric tubes have been removed. No pneumothorax or pleural effusion is noted. Both lungs are clear. The visualized skeletal structures are unremarkable. IMPRESSION: Stable cardiomegaly. No acute cardiopulmonary  abnormality seen. Endotracheal and nasogastric tubes have been removed. Electronically Signed   By: Marijo Conception M.D.   On: 04/19/2020 09:28   DG CHEST PORT 1 VIEW  Result Date: 04/18/2020 CLINICAL DATA:  Respiratory failure, ventilatory support EXAM: PORTABLE CHEST 1 VIEW COMPARISON:  04/17/2020 FINDINGS: Endotracheal tube 4 cm above the carina. Left subclavian pacemaker/defibrillator noted. NG tube enters the stomach with the tip not  visualized. Stable cardiomegaly with prominent central vascularity. Dense left lower lobe collapse/consolidation, slightly worse. Difficult to exclude developing left effusion. No significant pneumothorax. IMPRESSION: Increased left lower lobe retrocardiac collapse/consolidation. Stable cardiomegaly and vascular congestion Electronically Signed   By: Jerilynn Mages.  Shick M.D.   On: 04/18/2020 09:41   DG CHEST PORT 1 VIEW  Result Date: 04/17/2020 CLINICAL DATA:  Recent CVA.  Hypoxia. EXAM: PORTABLE CHEST 1 VIEW COMPARISON:  September 27, 2018 FINDINGS: Endotracheal tube tip is 3.8 cm above the carina. Nasogastric tube tip and side port are below the diaphragm. Pacemaker present with lead attached to the right ventricle, stable. There is ill-defined opacity in the left perihilar region. Lungs elsewhere clear. There is stable cardiomegaly with pulmonary vascularity normal. No adenopathy. No bone lesions. IMPRESSION: Tube positions as described without pneumothorax. Ill-defined opacity left perihilar region consistent with developing pneumonia or possibly aspiration. Lungs elsewhere clear. Stable cardiomegaly. Stable pacemaker lead positioning. Electronically Signed   By: Lowella Grip III M.D.   On: 04/17/2020 15:36   ECHOCARDIOGRAM COMPLETE  Result Date: 04/17/2020    ECHOCARDIOGRAM REPORT   Patient Name:   NAELLE DIEGEL Date of Exam: 04/17/2020 Medical Rec #:  [id number]    Height:       63.5 in Accession #:    [id number]   Weight:       252.4 lb Date of Birth:  June 11, 1971    BSA:          2.147 m Patient Age:    30 years     BP:           133/83 mmHg Patient Gender: F            HR:           66 bpm. Exam Location:  Inpatient Procedure: 2D Echo, Color Doppler and Cardiac Doppler Indications:    Stroke i163.9  History:        Patient has prior history of Echocardiogram examinations, most                 recent 09/27/2019. CHF, Defibrillator, Pulmonary HTN; Risk                 Factors:Hypertension and Dyslipidemia.   Sonographer:    Raquel Sarna Senior RDCS Referring Phys: Cameron KIRKPATRICK  Sonographer Comments: Scanned supine on artificial respirator IMPRESSIONS  1. No evidence of left ventricular thrombus. Left ventricular ejection fraction, by estimation, is 20 to 25%. The left ventricle has severely decreased function. The left ventricle demonstrates global hypokinesis. The left ventricular internal cavity size was severely dilated. Left ventricular diastolic parameters are consistent with Grade III diastolic dysfunction (restrictive).  2. Right ventricular systolic function is mildly reduced. The right ventricular size is normal. There is mildly elevated pulmonary artery systolic pressure. The estimated right ventricular systolic pressure is 25.3 mmHg.  3. Left atrial size was severely dilated.  4. A small pericardial effusion is present. The pericardial effusion is circumferential. There is no evidence of cardiac tamponade.  5. The mitral valve is normal in structure.  Mild to moderate mitral valve regurgitation.  6. The aortic valve is normal in structure. Aortic valve regurgitation is not visualized.  7. The inferior vena cava is dilated in size with <50% respiratory variability, suggesting right atrial pressure of 15 mmHg. FINDINGS  Left Ventricle: No evidence of left ventricular thrombus. Left ventricular ejection fraction, by estimation, is 20 to 25%. The left ventricle has severely decreased function. The left ventricle demonstrates global hypokinesis. The left ventricular internal cavity size was severely dilated. There is no left ventricular hypertrophy. Left ventricular diastolic parameters are consistent with Grade III diastolic dysfunction (restrictive). Right Ventricle: The right ventricular size is normal. No increase in right ventricular wall thickness. Right ventricular systolic function is mildly reduced. There is mildly elevated pulmonary artery systolic pressure. The tricuspid regurgitant velocity  is  2.40 m/s, and with an assumed right atrial pressure of 15 mmHg, the estimated right ventricular systolic pressure is 29.5 mmHg. Left Atrium: Left atrial size was severely dilated. Right Atrium: Right atrial size was normal in size. Pericardium: A small pericardial effusion is present. The pericardial effusion is circumferential. There is no evidence of cardiac tamponade. Mitral Valve: The mitral valve is normal in structure. Mild to moderate mitral valve regurgitation, with posteriorly-directed jet. Tricuspid Valve: The tricuspid valve is normal in structure. Tricuspid valve regurgitation is trivial. Aortic Valve: The aortic valve is normal in structure. Aortic valve regurgitation is not visualized. Pulmonic Valve: The pulmonic valve was grossly normal. Pulmonic valve regurgitation is not visualized. Aorta: The aortic root and ascending aorta are structurally normal, with no evidence of dilitation. Venous: The inferior vena cava is dilated in size with less than 50% respiratory variability, suggesting right atrial pressure of 15 mmHg. IAS/Shunts: No atrial level shunt detected by color flow Doppler. Additional Comments: A pacer wire is visualized.  LEFT VENTRICLE PLAX 2D LVIDd:         7.60 cm LVIDs:         6.80 cm LV PW:         0.90 cm LV IVS:        0.80 cm LVOT diam:     1.90 cm LV SV:         31 LV SV Index:   15 LVOT Area:     2.84 cm  LV Volumes (MOD) LV vol d, MOD A2C: 203.0 ml LV vol d, MOD A4C: 234.0 ml LV vol s, MOD A2C: 159.0 ml LV vol s, MOD A4C: 167.0 ml LV SV MOD A2C:     44.0 ml LV SV MOD A4C:     234.0 ml LV SV MOD BP:      57.4 ml RIGHT VENTRICLE RV S prime:     9.81 cm/s TAPSE (M-mode): 2.0 cm LEFT ATRIUM              Index       RIGHT ATRIUM           Index LA diam:        5.60 cm  2.61 cm/m  RA Area:     21.10 cm LA Vol (A2C):   91.9 ml  42.80 ml/m RA Volume:   57.10 ml  26.59 ml/m LA Vol (A4C):   126.0 ml 58.68 ml/m LA Biplane Vol: 111.0 ml 51.69 ml/m  AORTIC VALVE LVOT Vmax:   65.27  cm/s LVOT Vmean:  49.800 cm/s LVOT VTI:    0.111 m  AORTA Ao Root diam: 2.70 cm Ao Asc diam:  2.90 cm  MR Peak grad:    65.0 mmHg   TRICUSPID VALVE MR Mean grad:    43.0 mmHg   TR Peak grad:   23.0 mmHg MR Vmax:         403.00 cm/s TR Vmax:        240.00 cm/s MR Vmean:        307.0 cm/s MR PISA:         1.57 cm    SHUNTS MR PISA Eff ROA: 12 mm      Systemic VTI:  0.11 m MR PISA Radius:  0.50 cm     Systemic Diam: 1.90 cm Dani Gobble Croitoru MD Electronically signed by Sanda Klein MD Signature Date/Time: 04/17/2020/6:01:10 PM    Final    IR PERCUTANEOUS ART THROMBECTOMY/INFUSION INTRACRANIAL INC DIAG ANGIO  Result Date: 04/19/2020 INDICATION: New onset right-sided weakness, with slurred speech. 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 EXAM: 1. EMERGENT LARGE VESSEL OCCLUSION THROMBOLYSIS anterior CIRCULATION) COMPARISON:  CT angiogram of the head and neck of April 17, 2020. MEDICATIONS: Ancef 2 g IV antibiotic was administered within 1 hour of the procedure. ANESTHESIA/SEDATION: General anesthesia CONTRAST:  Isovue 300 approximately 200 mL FLUOROSCOPY TIME:  Fluoroscopy Time: 105 minutes 18 seconds (4849 mGy). COMPLICATIONS: None immediate. TECHNIQUE: Following a full explanation of the procedure along with the potential associated complications, an informed witnessed consent was obtained. The risks of intracranial hemorrhage of 10%, worsening neurological deficit, ventilator dependency, death and inability to revascularize were all reviewed in detail with the patient's spouse The patient was then put under general anesthesia by the Department of Anesthesiology at [facility]. The right groin was prepped and draped in the usual sterile fashion. Thereafter using modified Seldinger technique, transfemoral access into the right common femoral artery was obtained without difficulty. Over a 0.035 inch guidewire an 8 French 25 cm Pinnacle sheath was inserted. Through this,  and also over a 0.035 inch guidewire a 5 [place] JB 1 catheter was advanced to the aortic arch region and selectively positioned right common carotid artery. FINDINGS: The right common carotid arteriogram demonstrates the right external carotid artery and its major branches to be widely patent. The right internal carotid artery at the bulb to the cranial skull base is widely patent. The petrous, the cavernous and the supraclinoid segments are widely patent. The left middle cerebral artery demonstrates complete angiographic occlusion at its origin. The left anterior cerebral artery opacifies into the capillary and venous phases. Also noted is a left posterior communicating artery opacifying the left posterior cerebral artery distribution. PROCEDURE: Balloon guide was then advanced to the distal left cervical ICA over a 0.035 inch Roadrunner guidewire. Through this, a combination of an 021 150 cm Trevo ProVue microcatheter inside an 071 Zoom aspiration catheter was advanced over a 0.014 inch standard Synchro micro guidewire with a J configuration to the supraclinoid left ICA. Micro guidewire was then gently manipulated and advanced without difficulty to the M2 region of the inferior division followed by the microcatheter. The guidewire was removed. Good aspiration obtained from the hub of the microcatheter. A gentle contrast injection demonstrated safe position of the tip of the microcatheter. A Tiger 21 retrieval device was then advanced to the distal end of the microcatheter. This was then deployed in the usual manner by retracting the microcatheter. The proximal portion of the Tiger retrieval device was in the proximal occluded segment of the left middle cerebral artery. The aspiration catheter was then advanced  to engage the proximal occlusion of the left middle cerebral artery. After approximately 2 minutes of aspiration and during which time the retrieval device was expanded and decreased in diameter, the  combination of the retrieval device, the aspiration catheter, and the microcatheter were retrieved and removed. Proximal flow arrest was reversed. A control arteriogram performed through the balloon guide catheter in the left internal carotid artery demonstrates revascularization of the left middle cerebral M1 segment of the anterior temporal branch, and also the superior division achieving a TICI 2b revascularization. This prompted another pass using the combination of the 071 aspiration catheter with the 021 microcatheter advanced again into the occluded inferior division in the M2 M3 region. Again the micro guidewire was removed. Good aspiration obtained from the hub of the microcatheter. At this time, a 5 mm x 37 mm Embotrap retrieval device was then advanced to the distal end of the microcatheter. This was then deployed in the usual manner with the proximal portion of the device in the proximal portion of the occlusion. The Zoom aspiration catheter was again engaged in the proximal portion of the clot. Continuous aspiration was then performed for approximately 2 minutes at the hub of the aspiration catheter, and also at the hub of the balloon guide with right proximal flow arrest with aspiration with a 60 mL syringe. Thereafter, the combination of the retrieval device, the microcatheter, and the aspiration catheter were retrieved and removed. Aspiration was reversed. 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 The next pass was made with a Solitaire X 4 x 40 mm, using the above combination as described above. Again there continued to be occlusion of the inferior division. The next stent was with a Zoom 35 aspiration catheter which was advanced inside of a 35 aspiration Zoom catheter and engaged at the proximal portion of the occluded inferior division. Aspiration was  continued for approximately 2 minutes. Thereafter, the combination of the aspiration 35 catheter, and the 55 catheter were retrieved and removed. A control arteriogram performed continued to demonstrate occluded inferior division. The next pass was made with a 6.5 mm x 40 mm Embotrap retrieval device again engaged in the occluded inferior division. Following flow arrest, and aspiration through the 35 catheter and also the 55 aspiration catheter for approximately 2 minutes with proximal flow arrest and a 60 mL syringe at the hub of the balloon guide catheter, the combination was retrieved and removed. Arteriogram performed following reversal of flow arrest in the left internal carotid artery again demonstrated no change in the occluded inferior division. The next pass was made with a 6 mm x 40 mm Solitaire X retrieval device again with proximal flow arrest, and constant aspiration with a 60 mL syringe at the hub of the balloon guide catheter in the left internal carotid artery, and the 55 aspiration catheter engaged in the proximal portion of the clot in the inferior division for approximately 2 minutes. 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 It was, therefore, elected to proceed with placement of a stent to open up the prominent inferior division. Prior to this, the patient was loaded with aspirin 81 mg, and Brilinta 180 mg via an orogastric tube. CT of the brain was performed which showed  no evidence of intracranial hemorrhage. Through the balloon guide in left internal carotid artery, a combination of a Catalyst 115 cm guide catheter and an 021 Trevo ProVue microcatheter combination was advanced over a 0.014 inch Aristotle micro guidewire to the supraclinoid left ICA. The micro guidewire was then manipulated with a  torque device and advanced through the occluded left MCA inferior division into the M2 M3 region followed by the microcatheter. The guidewire was removed. Good aspiration obtained from the hub of the microcatheter which was then connected to continuous heparinized saline infusion. Simultaneous arteriograms were performed through the microcatheter and also through the Catalyst guide catheter in the supraclinoid left ICA in order to engage the occluded segment of the inferior division. It was decided to place a 4.5 mm x 30 mm Atlas Neuroform stent. This was then advanced to the distal end of the microcatheter. The O ring on the delivery microcatheter was loosened. The stent was then deployed by slow steady retrieval of the 021 microcatheter. Following the delivery, a control arteriogram performed through the 5 [place] Catalyst guide catheter in the left internal carotid artery demonstrated excellent apposition and coverage at the proximal, and the distal end of the stent. Also noted now was a TICI 2c revascularization of the left middle cerebral artery. Prior to the placement of the stent, the patient was given a bolus dose of Cangrelor IV followed by a slow infusion for 4 hours. Control arteriogram performed at 10 and 20 minutes post placement of the stent continued to demonstrate wide patency of the stented segment of the inferior division of the left middle cerebral artery. The Catalyst guide catheter was retrieved and removed. Control arteriogram performed through the balloon guide now in the left common carotid artery demonstrated patency of the left internal carotid artery proximally and distally intracranially. There continued to be patency of the left middle cerebral artery with a TICI 2c revascularization. The left anterior cerebral artery continued to be widely patent as well. Also noted was free flow into the left posterior communicating artery and the left posterior cerebral artery distribution. Throughout  the procedure, the patient's blood pressure and neurological status remain stable. No evidence of extravasation or dissection was seen. The 8 French balloon guide was removed. An 8 French Angio-Seal closure device was then placed for hemostasis in the right common femoral puncture site. Distal pulses continued to be Dopplerable in the feet bilaterally. CT of the brain demonstrated no evidence of intracranial hemorrhage, or mass effect or midline shift. Patient was left intubated on account of her medical condition, and low ejection fraction and transferred to the neuro ICU for post revascularization management. 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 PLAN: Follow-up in the clinic 4 weeks post discharge. Electronically Signed   By: Luanne Bras M.D.   On: 04/18/2020 09:51   CT HEAD CODE STROKE WO CONTRAST  Result Date: 04/17/2020 CLINICAL DATA:  Code stroke. Neuro deficit, acute stroke suspected. Right-sided weakness. EXAM: CT HEAD WITHOUT CONTRAST TECHNIQUE: Contiguous axial images were obtained from the base of the skull through  the vertex without intravenous contrast. COMPARISON:  None. FINDINGS: Brain: Ill-defined hypodensity of the left putamen (for example see series 3, image 16). No evidence of acute hemorrhage, hydrocephalus, extra-axial collection or mass lesion/mass effect. Vascular: No definite hyperdense vessel. Skull: No acute fracture. Sinuses/Orbits: Left maxillary sinus retention cyst versus polyp. Unremarkable orbits. Other: No mastoid effusions. ASPECTS  [facility] Stroke Program Early CT Score) - Ganglionic level infarction (caudate, lentiform nuclei, internal capsule, insula, M1-M3 cortex): 6 - Supraganglionic infarction (M4-M6 cortex): 3 Total score (0-10 with 10 being normal): 9 IMPRESSION: 1. Subtle hypodensity involving the left putamen, which may represent acute left MCA territory infarct. ASPECTS is 9. MRI could further characterize if clinically indicated. 2. No acute hemorrhage. Code stroke imaging results were communicated on 04/17/2020 at 10:01 am to provider Dr. Leonel Ramsay via telephone, who verbally acknowledged these results. Electronically Signed   By: Margaretha Sheffield MD   On: 04/17/2020 10:07    Labs:  CBC: Recent Labs    04/17/20 0949 04/17/20 0949 04/17/20 1000 04/17/20 1630 04/17/20 1958 04/18/20 0950  WBC 9.1  --   --   --  14.5* 16.0*  HGB 11.2*   < > 11.9* 11.9* 11.2* 9.6*  HCT 38.0   < > 35.0* 35.0* 36.6 32.5*  PLT 304  --   --   --  421* 294   < > = values in this interval not displayed.    COAGS: Recent Labs    04/17/20 0949  INR 1.1  APTT 25    BMP: Recent Labs    01/26/20 1030 01/26/20 1030 01/31/20 1002 01/31/20 1002 02/08/20 0948 02/23/20 0903 02/23/20 0903 04/17/20 0949 04/17/20 1000 04/17/20 1630 04/18/20 0950  NA 136   < > 138   < > 138 138   < > 138 140 141 142  K 4.2   < > 3.3*   < > 4.4 3.5   < > 3.8 3.8 4.0 3.5  CL 106   < > 104   < > 102 104  --  106 107  --  115*  CO2 22   < > 26   < > 24 23  --  22  --   --  19*  GLUCOSE 102*   < > 73   < > 87 100*  --  106* 106*  --  91  BUN 18   < > 15   < > 18 12  --  16 17  --  9  CALCIUM 9.2   < > 9.3   < > 9.7 9.1  --  9.0  --   --  8.2*  CREATININE 1.41*   < > 1.31*   < > 1.26* 1.17*  --  1.28* 1.20*  --  0.89  GFRNONAA 44*   < > 48*   < > 51* 55*  --  51*  --   --  >60  GFRAA 51*  --  56*  --  58* >60  --   --   --   --   --    < > = values in this interval not displayed.    LIVER FUNCTION TESTS: Recent Labs    06/28/19 1020  06/28/19 1020 09/27/19 1028 12/30/19 1014 04/17/20 0949 04/18/20 0950  BILITOT 0.2*  --  0.4 0.3 0.6  --   AST 18  --  15 17 18   --   ALT 19  --  15 12 14   --  ALKPHOS 63  --  60 69 54  --   PROT 7.6  --  7.6 7.8 6.9  --   ALBUMIN 3.5   < > 3.5 3.5 3.4* 2.8*   < > = values in this interval not displayed.    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 Estanislado Pandy.  Will arrange outpatient follow up with Dr. Estanislado Pandy in about 2 weeks. Our office will call patient with appointment details.  Electronically Signed: Murrell Redden, PA-C 04/19/2020, 2:10 PM    I spent a total of 15 Minutes at the the patient's bedside AND on the patient's hospital floor or unit, greater than 50% of which was counseling/coordinating care for f/u stroke with cerebral intervention.

## 2020-04-19 NOTE — Consult Note (Signed)
Physical Medicine and Rehabilitation Consult Reason for Consult: Right side weakness and difficulty speaking Referring Physician: Dr. Leonie Man   HPI: [name] is a 49 y.o. right-handed female with history of diastolic congestive heart failure with ejection fraction of 15 to 20% maintained on Entresto, nonischemic cardiomyopathy with cardiac defibrillator placement, hypertension, hypothyroidism, morbid obesity with BMI 44.01.  Per chart review patient lives with spouse and son.  Husband works during the day.  Two-level home 3 steps to entry.  Independent prior to admission.  She did require some rest breaks during her ADLs.  She does not drive.  Presented 04/17/2020 right side weakness and difficulty speaking.  Admission chemistries glucose 106 creatinine 1.28.  Cranial CT scan showed subtle hypodensity involving the left putamen.  No acute hemorrhage.  Patient did not receive TPA.  CT angiogram of head and neck occlusion proximal left M1 segment with good distal collateral flow to the left MCA branches.  No other intracranial stenosis.  Patient underwent endovascular revascularization procedure per interventional radiology.  Latest follow-up cranial CT scan redemonstrated abnormal hypodensity within the posterior aspect of the left putamen.  Echocardiogram with ejection fraction of 20 to 93% grade 3 diastolic dysfunction and follow-up per cardiology services.  Patient is currently maintained on low-dose aspirin.  Subcutaneous Lovenox for DVT prophylaxis.  Tolerating a regular consistency diet.  Therapy evaluations completed with recommendations of physical medicine rehab consult.   Pt not feeling very hungry- didn't want ot eat much lunch- ate <25%.  LBM this AM- voiding well with Purewick.  Having pain/discomfort with voids- doesn't want to void on self.  Doesn't feel like UTI per pt.  Wears CPAP at home- full mask.  BP usually 80/60 at home- doesn't feel dizzy with that BP- is normal with  meds.   Review of Systems  Constitutional: Negative for chills and fever.  HENT: Negative for hearing loss.   Eyes: Negative for blurred vision and double vision.  Respiratory: Positive for shortness of breath.   Cardiovascular: Positive for palpitations and leg swelling. Negative for chest pain.  Gastrointestinal: Negative for constipation, heartburn, nausea and vomiting.  Genitourinary: Negative for dysuria, flank pain and hematuria.  Musculoskeletal: Positive for myalgias.  Neurological: Positive for speech change and weakness.  All other systems reviewed and are negative.  Past Medical History:  Diagnosis Date  . Automatic implantable cardioverter-defibrillator in situ   . Chronic CHF (congestive heart failure) (HCC)    a. EF 15-20% b. RHC (09/2013) RA 14, RV 57/22, PA 64/36 (48), PCWP 18, FIck CO/CI 3.7/1.6, PVR 8.1 WU, PA sat 47%   . History of stomach ulcers   . Hypertension   . Hypotension   . Hypothyroidism   . Morbid obesity ([place])   . Myocardial infarction ([place]) 08/2013  . Nocturnal dyspnea   . Nonischemic cardiomyopathy ([place])   . Sinus tachycardia   . Snoring-prob OSA 09/04/2011  . Sprint Fidelis ICD lead RECALL  [id number]   . UARS (upper airway resistance syndrome) 09/04/2011   HST 12/2013:  AHI 4/hr (numerous episodes of airflow reduction that did not have concomitant desaturation)    Past Surgical History:  Procedure Laterality Date  . BREATH TEK H PYLORI N/A 11/09/2014   Procedure: BREATH TEK H PYLORI;  Surgeon: Greer Pickerel, MD;  Location: [facility];  Service: General;  Laterality: N/A;  . CARDIAC CATHETERIZATION  ~ 2006; 09/2013  . CARDIAC CATHETERIZATION N/A 05/23/2016   Procedure: Right Heart Cath;  Surgeon: Kirk Ruths  Claris Gladden, MD;  Location: [facility];  Service: Cardiovascular;  Laterality: N/A;  . CARDIAC DEFIBRILLATOR PLACEMENT  2006; 12/26/2013   Medtronic [id number]; 12-2013 ICD gen change and RV lead revision with new 7353 RV lead by Dr [name]  .  CESAREAN SECTION  1999  . IMPLANTABLE CARDIOVERTER DEFIBRILLATOR GENERATOR CHANGE N/A 12/26/2013   Procedure: IMPLANTABLE CARDIOVERTER DEFIBRILLATOR GENERATOR CHANGE;  Surgeon: Deboraha Sprang, MD;  Location: [facility];  Service: Cardiovascular;  Laterality: N/A;  . LEAD REVISION N/A 12/26/2013   Procedure: LEAD REVISION;  Surgeon: Deboraha Sprang, MD;  Location: [facility] CATH LAB;  Service: Cardiovascular;  Laterality: N/A;  . RADIOLOGY WITH ANESTHESIA N/A 04/17/2020   Procedure: Code Stroke;  Surgeon: Luanne Bras, MD;  Location: [facility];  Service: Radiology;  Laterality: N/A;  . RIGHT HEART CATHETERIZATION N/A 02/15/2014   Procedure: RIGHT HEART CATH;  Surgeon: Larey Dresser, MD;  Location: [facility] CATH LAB;  Service: Cardiovascular;  Laterality: N/A;  . TUBAL LIGATION  1999   Family History  Problem Relation Age of Onset  . Heart disease Maternal Grandmother   . Heart disease Mother   . Obesity Mother   . High blood pressure Father   . Stroke Father    Social History:  reports that she has never smoked. She has never used smokeless tobacco. She reports that she does not drink alcohol and does not use drugs. Allergies: No Known Allergies Medications Prior to Admission  Medication Sig Dispense Refill  . amiodarone (PACERONE) 200 MG tablet Take 1 tablet (200 mg total) by mouth daily. 90 tablet 3  . atorvastatin (LIPITOR) 40 MG tablet TAKE 1 TABLET BY MOUTH ONCE A DAY (Patient taking differently: Take 40 mg by mouth daily. ) 30 tablet 11  . carvedilol (COREG) 25 MG tablet TAKE 1 TABLET BY MOUTH TWICE A DAY WITH A MEAL (Patient taking differently: Take 25 mg by mouth 2 (two) times daily with a meal. ) 180 tablet 3  . CORLANOR 5 MG TABS tablet TAKE 1 TABLET BY MOUTH TWICE (2) DAILY WITH MEALS (Patient taking differently: Take 5 mg by mouth 2 (two) times daily with a meal. ) 60 tablet 6  . dapagliflozin propanediol (FARXIGA) 10 MG TABS tablet Take 10 mg by mouth daily before breakfast. 30 tablet 5  .  digoxin (LANOXIN) 0.125 MG tablet TAKE 1 TABLET BY MOUTH ONCE A DAY (Patient taking differently: Take 0.125 mg by mouth daily. ) 30 tablet 11  . ENTRESTO 49-51 MG TAKE 1 TABLET BY MOUTH TWICE (2) DAILY (Patient taking differently: Take 1 tablet by mouth 2 (two) times daily. ) 60 tablet 11  . spironolactone (ALDACTONE) 25 MG tablet TAKE 1 TABLET BY MOUTH EVERY NIGHT AT BEDTIME (Patient taking differently: Take 25 mg by mouth at bedtime. ) 90 tablet 3  . torsemide (DEMADEX) 20 MG tablet Take 4 tablets (80 mg total) by mouth 2 (two) times daily for 3 days, THEN 4 tablets (80 mg total) in the morning for 30 days, THEN 3 tablets (60 mg total) every evening. (Patient taking differently: Take 4 tablets (80mg ) in the morning and 2 tablets (40mg ) in the evening.) 540 tablet 3  . levothyroxine (SYNTHROID) 25 MCG tablet Take 1 tablet (25 mcg total) by mouth daily before breakfast. 90 tablet 3  . metolazone (ZAROXOLYN) 2.5 MG tablet Take 1 tablet (2.5 mg total) by mouth as needed (extra fluid as directed by HF clinic). 5 tablet 3  . potassium chloride  SA (KLOR-CON) 20 MEQ tablet Take 2 tablets (40 mEq total) by mouth 2 (two) times daily. (Patient taking differently: Take 40 mEq by mouth 2 (two) times daily. Taking 2 tab in the morning and 2 tab at night) 60 tablet 6  . Vitamin D, Ergocalciferol, (DRISDOL) 1.25 MG (50000 UNIT) CAPS capsule Take 1 capsule (50,000 Units total) by mouth every 7 (seven) days. 4 capsule 0    Home: Home Living Family/patient expects to be discharged to:: Private residence Living Arrangements: Spouse/significant other, Children (son) Available Help at Discharge: Family (husband works from home) Type of Home: House Home Access: Stairs to enter Technical brewer of Steps: 3 Entrance Stairs-Rails: None Home Layout: Two level Alternate Level Stairs-Number of Steps: flight Alternate Level Stairs-Rails: Right, Left Bathroom Shower/Tub: Chiropodist:  Standard Home Equipment: None  Functional History: Prior Function Level of Independence: Independent Comments: required rest breaks for ADL tasks, spouse assisted with socks, performed simple iADL tasks, doesn't drive Functional Status:  Mobility: Bed Mobility Overal bed mobility: Needs Assistance Bed Mobility: Rolling, Sidelying to Sit Rolling: Mod assist Sidelying to sit: Mod assist General bed mobility comments: VCs for sequencing/carrying out tasks, assist for RLE over EOB and trunk elevation; given increased time/cues pt is able to scoot herself towards EOB  Transfers Overall transfer level: Needs assistance Equipment used: 1 person hand held assist, 2 person hand held assist Transfers: Sit to/from Stand Sit to Stand: Min assist, +2 safety/equipment General transfer comment: performed x2 from EOB; light stabilizing assist once upright, cues for safe hand placement  Ambulation/Gait General Gait Details: pivot only, delay in execution.    ADL: ADL Overall ADL's : Needs assistance/impaired Eating/Feeding: NPO Grooming: Minimal assistance, Sitting Upper Body Bathing: Minimal assistance, Sitting Lower Body Bathing: Moderate assistance, Sit to/from stand, +2 for safety/equipment Upper Body Dressing : Minimal assistance, Sitting Lower Body Dressing: Moderate assistance, +2 for safety/equipment, Sit to/from stand Lower Body Dressing Details (indicate cue type and reason): assist to don socks  Toilet Transfer: Minimal assistance, +2 for safety/equipment, +2 for physical assistance Toilet Transfer Details (indicate cue type and reason): simulated via few steps to recliner; +2 minA progressed to minA+1 (+2 safety) Toileting- Clothing Manipulation and Hygiene: Moderate assistance, Sit to/from stand Functional mobility during ADLs: Minimal assistance, +2 for physical assistance, +2 for safety/equipment (HHA)  Cognition: Cognition Overall Cognitive Status: Impaired/Different from  baseline Orientation Level: Oriented X4 Cognition Arousal/Alertness: Awake/alert Behavior During Therapy: WFL for tasks assessed/performed Overall Cognitive Status: Impaired/Different from baseline Area of Impairment: Problem solving, Following commands, Awareness, Memory Memory: Decreased short-term memory Following Commands: Follows one step commands with increased time, Follows one step commands inconsistently Awareness: Emergent Problem Solving: Slow processing, Decreased initiation, Requires verbal cues, Requires tactile cues General Comments: pt A&Ox4, word finding difficulties, increased processing time and increased time for verbalizations (approx 5-10 second delay); often requires multimodal cueing for command follow   Blood pressure (!) 67/44, pulse 85, temperature 98.4 F (36.9 C), temperature source Oral, resp. rate 17, height 5' 3.5" (1.613 m), weight 114.5 kg, SpO2 99 %. Physical Exam Vitals and nursing note reviewed. Exam conducted with a chaperone present.  Constitutional:      Comments: Awake, but depressed affect; husband at bedside; sitting up- not eating lunch <25% of food eaten, NAD  HENT:     Head: Normocephalic and atraumatic.     Comments: Smile appears equal at rest, but won't smile for me; tongue midline; facial sensation intact    Right Ear: External  ear normal.     Left Ear: External ear normal.     Nose: Nose normal. No congestion.     Mouth/Throat:     Mouth: Mucous membranes are dry.     Pharynx: Oropharynx is clear. No oropharyngeal exudate.  Eyes:     Comments: EOMI B/L- little nystagmus seen to L  Cardiovascular:     Rate and Rhythm: Regular rhythm. Tachycardia present.     Heart sounds: Normal heart sounds.     Comments: HR 100-108 Pulmonary:     Effort: Pulmonary effort is normal. No respiratory distress.     Breath sounds: Normal breath sounds. No wheezing or rhonchi.  Abdominal:     Comments: Soft, NT, ND, (+)BS -hypoactive   Genitourinary:    Comments: Has purewick- voiding a lot- no color to urine Musculoskeletal:     Comments: RUE- biceps 4+/5, triceps 4/5, WE 4+/5, grip 4-/5, finger abd 3+/5 LUE 5-/5 in same muscles- but less effort RLE- HF 4-/5, KE, DF and PF 4/5 LLE- 5-/5 in same muscles- poor effort  Skin:    Comments: 3 IV's- 2 L lower arm and 1 in R arm- all look OK Heels no bogginess   Neurological:     Comments: Patient is alert in no acute distress.  Speech is dysarthric but intelligible.  Provides name and age.  Follows simple commands. Pt has some aphasia- mild- to moderate- word finding and a lot of hesitancy answering questions, and esp asking spontaneous questions.  Intact to light touch in x 4 extremities.   Psychiatric:     Comments: Depressed affect- very little spontaneous movement and speech     Results for orders placed or performed during the hospital encounter of 04/17/20 (from the past 24 hour(s))  Hemoglobin A1c     Status: None   Collection Time: 04/18/20  9:50 AM  Result Value Ref Range   Hgb A1c MFr Bld 5.5 4.8 - 5.6 %   Mean Plasma Glucose 111 mg/dL  Lipid panel     Status: Abnormal   Collection Time: 04/18/20  9:50 AM  Result Value Ref Range   Cholesterol 198 0 - 200 mg/dL   Triglycerides 92 <150 mg/dL   HDL 50 >40 mg/dL   Total CHOL/HDL Ratio 4.0 RATIO   VLDL 18 0 - 40 mg/dL   LDL Cholesterol 130 (H) 0 - 99 mg/dL  Magnesium     Status: Abnormal   Collection Time: 04/18/20  9:50 AM  Result Value Ref Range   Magnesium 1.6 (L) 1.7 - 2.4 mg/dL  Renal function panel     Status: Abnormal   Collection Time: 04/18/20  9:50 AM  Result Value Ref Range   Sodium 142 135 - 145 mmol/L   Potassium 3.5 3.5 - 5.1 mmol/L   Chloride 115 (H) 98 - 111 mmol/L   CO2 19 (L) 22 - 32 mmol/L   Glucose, Bld 91 70 - 99 mg/dL   BUN 9 6 - 20 mg/dL   Creatinine, Ser 0.89 0.44 - 1.00 mg/dL   Calcium 8.2 (L) 8.9 - 10.3 mg/dL   Phosphorus 2.8 2.5 - 4.6 mg/dL   Albumin 2.8 (L) 3.5 - 5.0  g/dL   GFR, Estimated >60 >60 mL/min   Anion gap 8 5 - 15  CBC with Differential/Platelet     Status: Abnormal   Collection Time: 04/18/20  9:50 AM  Result Value Ref Range   WBC 16.0 (H) 4.0 - 10.5 K/uL   RBC  4.05 3.87 - 5.11 MIL/uL   Hemoglobin 9.6 (L) 12.0 - 15.0 g/dL   HCT 32.5 (L) 36 - 46 %   MCV 80.2 80.0 - 100.0 fL   MCH 23.7 (L) 26.0 - 34.0 pg   MCHC 29.5 (L) 30.0 - 36.0 g/dL   RDW 16.8 (H) 11.5 - 15.5 %   Platelets 294 150 - 400 K/uL   nRBC 0.0 0.0 - 0.2 %   Neutrophils Relative % 81 %   Neutro Abs 13.0 (H) 1.7 - 7.7 K/uL   Lymphocytes Relative 10 %   Lymphs Abs 1.6 0.7 - 4.0 K/uL   Monocytes Relative 8 %   Monocytes Absolute 1.3 (H) 0.1 - 1.0 K/uL   Eosinophils Relative 0 %   Eosinophils Absolute 0.0 0.0 - 0.5 K/uL   Basophils Relative 0 %   Basophils Absolute 0.0 0.0 - 0.1 K/uL   Immature Granulocytes 1 %   Abs Immature Granulocytes 0.09 (H) 0.00 - 0.07 K/uL  Platelet inhibition p2y12 (Not at [facility])     Status: Abnormal   Collection Time: 04/18/20 12:37 PM  Result Value Ref Range   Platelet Function  P2Y12 48 (L) 182 - 335 PRU  Urine rapid drug screen (hosp performed)     Status: None   Collection Time: 04/18/20  4:11 PM  Result Value Ref Range   Opiates NONE DETECTED NONE DETECTED   Cocaine NONE DETECTED NONE DETECTED   Benzodiazepines NONE DETECTED NONE DETECTED   Amphetamines NONE DETECTED NONE DETECTED   Tetrahydrocannabinol NONE DETECTED NONE DETECTED   Barbiturates NONE DETECTED NONE DETECTED   CT Code Stroke CTA Head W/WO contrast  Result Date: 04/17/2020 CLINICAL DATA:  Right-sided weakness. EXAM: CT ANGIOGRAPHY HEAD AND NECK CT PERFUSION BRAIN TECHNIQUE: Multidetector CT imaging of the head and neck was performed using the standard protocol during bolus administration of intravenous contrast. Multiplanar CT image reconstructions and MIPs were obtained to evaluate the vascular anatomy. Carotid stenosis measurements (when applicable) are obtained utilizing  NASCET criteria, using the distal internal carotid diameter as the denominator. Multiphase CT imaging of the brain was performed following IV bolus contrast injection. Subsequent parametric perfusion maps were calculated using RAPID software. CONTRAST:  172mL OMNIPAQUE IOHEXOL 350 MG/ML SOLN COMPARISON:  CT head 04/17/2020 FINDINGS: CTA NECK FINDINGS Aortic arch: On the initial scan, there is no arterial enhancement due to early trigger of the machine. Patient was rescanned immediately after the initial scan with satisfactory opacification of the intracranial arterial system. The neck was not re-scanned to evaluate the carotid and vertebral arteries in the neck. Right carotid system: Not evaluated due to lack of arterial contrast. Left carotid system: Not evaluated due to lack of arterial contrast. Vertebral arteries: Not evaluated. Skeleton: No acute abnormality. Other neck: Question left thyroidectomy. Right thyroid mildly enlarged without focal abnormality. No adenopathy in the neck. Upper chest: Mild hilar adenopathy bilaterally.  Lung apices clear. Review of the MIP images confirms the above findings CTA HEAD FINDINGS Anterior circulation: Internal carotid artery widely patent through the cavernous segment bilaterally. Occlusion left M1 segment proximally. There is collateral circulation with good opacification of left M2 and M3 branches. Both anterior cerebral arteries are patent without stenosis. Right middle cerebral artery widely patent without stenosis. Posterior circulation: Both vertebral arteries patent to the basilar. PICA patent bilaterally. Basilar widely patent. Superior cerebellar and posterior cerebral arteries patent bilaterally without stenosis or large vessel occlusion. Venous sinuses: Normal venous enhancement. Anatomic variants: None Review of the MIP images  confirms the above findings CT Brain Perfusion Findings: ASPECTS: 9 CBF (<30%) Volume: 0ML Perfusion (Tmax>6.0s) volume: 68mL Mismatch  Volume: 24mL Infarction Location:Left MCA territory involving the frontal and parietal lobe. IMPRESSION: 1. CT perfusion positive for 64 mL of delayed perfusion in the left MCA territory. No fixed infarct on perfusion. 2. Occlusion proximal left M1 segment with good distal collateral flow to the left MCA branches. 3. No other intracranial stenosis 4. Carotid vertebral arteries in the neck were not evaluated due to lack of arterial opacification. The scan was performed early in the pulmonary arterial phase and subsequently immediately repeated with adequate intracranial arterial enhancement. 5. Images were reviewed with Dr. Leonel Ramsay following completion of the study Electronically Signed   By: Franchot Gallo M.D.   On: 04/17/2020 10:28   CT HEAD WO CONTRAST  Result Date: 04/18/2020 CLINICAL DATA:  Stroke, follow-up. Intracranial stent placed yesterday. EXAM: CT HEAD WITHOUT CONTRAST TECHNIQUE: Contiguous axial images were obtained from the base of the skull through the vertex without intravenous contrast. COMPARISON:  Noncontrast head CT performed 04/17/2020 at 6:18 p.m. Noncontrast head CT, CT angiogram head/neck and CT perfusion performed earlier the same day 04/17/2020. FINDINGS: Brain: Previously demonstrated vague hyperdensity within the left basal ganglia, left insula and left frontoparietal cortex is no longer present. Abnormal hypodensity is again demonstrated within the posterior aspect of the left putamen suspicious for acute/early subacute infarction (series 3, image 16). No other definite loss of gray-white differentiation is identified. No evidence of acute intracranial hemorrhage. No extra-axial fluid collection. No evidence of intracranial mass. No midline shift. Vascular: Redemonstrated vascular stent in the region of the M1/M2 left middle cerebral artery. No hyperdense vessel. Skull: Normal. Negative for fracture or focal lesion. Sinuses/Orbits: Visualized orbits show no acute finding.  Moderate-sized left maxillary sinus mucous retention cyst. No significant mastoid effusion. IMPRESSION: Previously demonstrated vague hyperdensity within the left basal ganglia, left insula and left frontoparietal cortex is no longer present and likely reflected contrast staining. Redemonstrated abnormal hypodensity within the posterior aspect of the left putamen suspicious for acute/early subacute infarction. No other definite loss of gray-white differentiation is identified. No acute intracranial hemorrhage. Redemonstrated vascular stent in the region of the M1/M2 left MCA. Electronically Signed   By: Kellie Simmering DO   On: 04/18/2020 10:02   CT HEAD WO CONTRAST  Result Date: 04/17/2020 CLINICAL DATA:  Stroke, follow-up; follow-up. CT post Cangrelor infusion. EXAM: CT HEAD WITHOUT CONTRAST TECHNIQUE: Contiguous axial images were obtained from the base of the skull through the vertex without intravenous contrast. COMPARISON:  Noncontrast head CT, CT angiogram head/neck and CT perfusion performed earlier the same day 04/17/2020. FINDINGS: Brain: There is subtle hyperdensity within the left basal ganglia as well as within the left insular cortex and left frontoparietal cortex along the left sylvian fissure, suspected to reflect contrast staining at site of acute infarction. Gray-white differentiation is otherwise maintained. No frank parenchymal hemorrhage. No evidence of intracranial mass. No significant mass effect.  No midline shift. Vascular: A vascular stent is now present in the region of the M1/M2 left middle cerebral artery. Skull: Normal. Negative for fracture or focal lesion. Sinuses/Orbits: Visualized orbits show no acute finding. Mucous retention cyst within the left maxillary sinus. Trace scattered paranasal sinus mucosal thickening elsewhere. No significant mastoid effusion. Other: Partially visualized support tubes. IMPRESSION: Subtle hyperdensity is present within the left basal ganglia, as well  as within the left insular cortex and left frontal parietal cortex along the left sylvian fissure.  Findings are suspected to reflect contrast staining at site of acute infarction. Trace adjacent contrast extravasation/subarachnoid hemorrhage is difficult to exclude. Gray-white differentiation is otherwise maintained. No frank parenchymal hemorrhage. A vascular stent is now present in the region of the M1/M2 left middle cerebral artery. Electronically Signed   By: Kellie Simmering DO   On: 04/17/2020 18:51   CT Code Stroke CTA Neck W/WO contrast  Result Date: 04/17/2020 CLINICAL DATA:  Right-sided weakness. EXAM: CT ANGIOGRAPHY HEAD AND NECK CT PERFUSION BRAIN TECHNIQUE: Multidetector CT imaging of the head and neck was performed using the standard protocol during bolus administration of intravenous contrast. Multiplanar CT image reconstructions and MIPs were obtained to evaluate the vascular anatomy. Carotid stenosis measurements (when applicable) are obtained utilizing NASCET criteria, using the distal internal carotid diameter as the denominator. Multiphase CT imaging of the brain was performed following IV bolus contrast injection. Subsequent parametric perfusion maps were calculated using RAPID software. CONTRAST:  138mL OMNIPAQUE IOHEXOL 350 MG/ML SOLN COMPARISON:  CT head 04/17/2020 FINDINGS: CTA NECK FINDINGS Aortic arch: On the initial scan, there is no arterial enhancement due to early trigger of the machine. Patient was rescanned immediately after the initial scan with satisfactory opacification of the intracranial arterial system. The neck was not re-scanned to evaluate the carotid and vertebral arteries in the neck. Right carotid system: Not evaluated due to lack of arterial contrast. Left carotid system: Not evaluated due to lack of arterial contrast. Vertebral arteries: Not evaluated. Skeleton: No acute abnormality. Other neck: Question left thyroidectomy. Right thyroid mildly enlarged without focal  abnormality. No adenopathy in the neck. Upper chest: Mild hilar adenopathy bilaterally.  Lung apices clear. Review of the MIP images confirms the above findings CTA HEAD FINDINGS Anterior circulation: Internal carotid artery widely patent through the cavernous segment bilaterally. Occlusion left M1 segment proximally. There is collateral circulation with good opacification of left M2 and M3 branches. Both anterior cerebral arteries are patent without stenosis. Right middle cerebral artery widely patent without stenosis. Posterior circulation: Both vertebral arteries patent to the basilar. PICA patent bilaterally. Basilar widely patent. Superior cerebellar and posterior cerebral arteries patent bilaterally without stenosis or large vessel occlusion. Venous sinuses: Normal venous enhancement. Anatomic variants: None Review of the MIP images confirms the above findings CT Brain Perfusion Findings: ASPECTS: 9 CBF (<30%) Volume: 0ML Perfusion (Tmax>6.0s) volume: 4mL Mismatch Volume: 22mL Infarction Location:Left MCA territory involving the frontal and parietal lobe. IMPRESSION: 1. CT perfusion positive for 64 mL of delayed perfusion in the left MCA territory. No fixed infarct on perfusion. 2. Occlusion proximal left M1 segment with good distal collateral flow to the left MCA branches. 3. No other intracranial stenosis 4. Carotid vertebral arteries in the neck were not evaluated due to lack of arterial opacification. The scan was performed early in the pulmonary arterial phase and subsequently immediately repeated with adequate intracranial arterial enhancement. 5. Images were reviewed with Dr. Leonel Ramsay following completion of the study Electronically Signed   By: Franchot Gallo M.D.   On: 04/17/2020 10:28   CT Code Stroke Cerebral Perfusion with contrast  Result Date: 04/17/2020 CLINICAL DATA:  Right-sided weakness. EXAM: CT ANGIOGRAPHY HEAD AND NECK CT PERFUSION BRAIN TECHNIQUE: Multidetector CT imaging of the  head and neck was performed using the standard protocol during bolus administration of intravenous contrast. Multiplanar CT image reconstructions and MIPs were obtained to evaluate the vascular anatomy. Carotid stenosis measurements (when applicable) are obtained utilizing NASCET criteria, using the distal internal carotid diameter as the  denominator. Multiphase CT imaging of the brain was performed following IV bolus contrast injection. Subsequent parametric perfusion maps were calculated using RAPID software. CONTRAST:  175mL OMNIPAQUE IOHEXOL 350 MG/ML SOLN COMPARISON:  CT head 04/17/2020 FINDINGS: CTA NECK FINDINGS Aortic arch: On the initial scan, there is no arterial enhancement due to early trigger of the machine. Patient was rescanned immediately after the initial scan with satisfactory opacification of the intracranial arterial system. The neck was not re-scanned to evaluate the carotid and vertebral arteries in the neck. Right carotid system: Not evaluated due to lack of arterial contrast. Left carotid system: Not evaluated due to lack of arterial contrast. Vertebral arteries: Not evaluated. Skeleton: No acute abnormality. Other neck: Question left thyroidectomy. Right thyroid mildly enlarged without focal abnormality. No adenopathy in the neck. Upper chest: Mild hilar adenopathy bilaterally.  Lung apices clear. Review of the MIP images confirms the above findings CTA HEAD FINDINGS Anterior circulation: Internal carotid artery widely patent through the cavernous segment bilaterally. Occlusion left M1 segment proximally. There is collateral circulation with good opacification of left M2 and M3 branches. Both anterior cerebral arteries are patent without stenosis. Right middle cerebral artery widely patent without stenosis. Posterior circulation: Both vertebral arteries patent to the basilar. PICA patent bilaterally. Basilar widely patent. Superior cerebellar and posterior cerebral arteries patent  bilaterally without stenosis or large vessel occlusion. Venous sinuses: Normal venous enhancement. Anatomic variants: None Review of the MIP images confirms the above findings CT Brain Perfusion Findings: ASPECTS: 9 CBF (<30%) Volume: 0ML Perfusion (Tmax>6.0s) volume: 59mL Mismatch Volume: 61mL Infarction Location:Left MCA territory involving the frontal and parietal lobe. IMPRESSION: 1. CT perfusion positive for 64 mL of delayed perfusion in the left MCA territory. No fixed infarct on perfusion. 2. Occlusion proximal left M1 segment with good distal collateral flow to the left MCA branches. 3. No other intracranial stenosis 4. Carotid vertebral arteries in the neck were not evaluated due to lack of arterial opacification. The scan was performed early in the pulmonary arterial phase and subsequently immediately repeated with adequate intracranial arterial enhancement. 5. Images were reviewed with Dr. Leonel Ramsay following completion of the study Electronically Signed   By: Franchot Gallo M.D.   On: 04/17/2020 10:28   DG CHEST PORT 1 VIEW  Result Date: 04/18/2020 CLINICAL DATA:  Respiratory failure, ventilatory support EXAM: PORTABLE CHEST 1 VIEW COMPARISON:  04/17/2020 FINDINGS: Endotracheal tube 4 cm above the carina. Left subclavian pacemaker/defibrillator noted. NG tube enters the stomach with the tip not visualized. Stable cardiomegaly with prominent central vascularity. Dense left lower lobe collapse/consolidation, slightly worse. Difficult to exclude developing left effusion. No significant pneumothorax. IMPRESSION: Increased left lower lobe retrocardiac collapse/consolidation. Stable cardiomegaly and vascular congestion Electronically Signed   By: Jerilynn Mages.  Shick M.D.   On: 04/18/2020 09:41   DG CHEST PORT 1 VIEW  Result Date: 04/17/2020 CLINICAL DATA:  Recent CVA.  Hypoxia. EXAM: PORTABLE CHEST 1 VIEW COMPARISON:  September 27, 2018 FINDINGS: Endotracheal tube tip is 3.8 cm above the carina. Nasogastric tube  tip and side port are below the diaphragm. Pacemaker present with lead attached to the right ventricle, stable. There is ill-defined opacity in the left perihilar region. Lungs elsewhere clear. There is stable cardiomegaly with pulmonary vascularity normal. No adenopathy. No bone lesions. IMPRESSION: Tube positions as described without pneumothorax. Ill-defined opacity left perihilar region consistent with developing pneumonia or possibly aspiration. Lungs elsewhere clear. Stable cardiomegaly. Stable pacemaker lead positioning. Electronically Signed   By: Lowella Grip III M.D.  On: 04/17/2020 15:36   ECHOCARDIOGRAM COMPLETE  Result Date: 04/17/2020    ECHOCARDIOGRAM REPORT   Patient Name:   ALDINA PORTA Date of Exam: 04/17/2020 Medical Rec #:  [id number]    Height:       63.5 in Accession #:    [id number]   Weight:       252.4 lb Date of Birth:  [birth year]    BSA:          2.147 m Patient Age:    65 years     BP:           133/83 mmHg Patient Gender: F            HR:           66 bpm. Exam Location:  Inpatient Procedure: 2D Echo, Color Doppler and Cardiac Doppler Indications:    Stroke i163.9  History:        Patient has prior history of Echocardiogram examinations, most                 recent 09/27/2019. CHF, Defibrillator, Pulmonary HTN; Risk                 Factors:Hypertension and Dyslipidemia.  Sonographer:    Raquel Sarna Senior RDCS Referring Phys: [place] KIRKPATRICK  Sonographer Comments: Scanned supine on artificial respirator IMPRESSIONS  1. No evidence of left ventricular thrombus. Left ventricular ejection fraction, by estimation, is 20 to 25%. The left ventricle has severely decreased function. The left ventricle demonstrates global hypokinesis. The left ventricular internal cavity size was severely dilated. Left ventricular diastolic parameters are consistent with Grade III diastolic dysfunction (restrictive).  2. Right ventricular systolic function is mildly reduced. The right ventricular  size is normal. There is mildly elevated pulmonary artery systolic pressure. The estimated right ventricular systolic pressure is 09.3 mmHg.  3. Left atrial size was severely dilated.  4. A small pericardial effusion is present. The pericardial effusion is circumferential. There is no evidence of cardiac tamponade.  5. The mitral valve is normal in structure. Mild to moderate mitral valve regurgitation.  6. The aortic valve is normal in structure. Aortic valve regurgitation is not visualized.  7. The inferior vena cava is dilated in size with <50% respiratory variability, suggesting right atrial pressure of 15 mmHg. FINDINGS  Left Ventricle: No evidence of left ventricular thrombus. Left ventricular ejection fraction, by estimation, is 20 to 25%. The left ventricle has severely decreased function. The left ventricle demonstrates global hypokinesis. The left ventricular internal cavity size was severely dilated. There is no left ventricular hypertrophy. Left ventricular diastolic parameters are consistent with Grade III diastolic dysfunction (restrictive). Right Ventricle: The right ventricular size is normal. No increase in right ventricular wall thickness. Right ventricular systolic function is mildly reduced. There is mildly elevated pulmonary artery systolic pressure. The tricuspid regurgitant velocity  is 2.40 m/s, and with an assumed right atrial pressure of 15 mmHg, the estimated right ventricular systolic pressure is 23.5 mmHg. Left Atrium: Left atrial size was severely dilated. Right Atrium: Right atrial size was normal in size. Pericardium: A small pericardial effusion is present. The pericardial effusion is circumferential. There is no evidence of cardiac tamponade. Mitral Valve: The mitral valve is normal in structure. Mild to moderate mitral valve regurgitation, with posteriorly-directed jet. Tricuspid Valve: The tricuspid valve is normal in structure. Tricuspid valve regurgitation is trivial. Aortic  Valve: The aortic valve is normal in structure. Aortic valve regurgitation is not visualized. Pulmonic  Valve: The pulmonic valve was grossly normal. Pulmonic valve regurgitation is not visualized. Aorta: The aortic root and ascending aorta are structurally normal, with no evidence of dilitation. Venous: The inferior vena cava is dilated in size with less than 50% respiratory variability, suggesting right atrial pressure of 15 mmHg. IAS/Shunts: No atrial level shunt detected by color flow Doppler. Additional Comments: A pacer wire is visualized.  LEFT VENTRICLE PLAX 2D LVIDd:         7.60 cm LVIDs:         6.80 cm LV PW:         0.90 cm LV IVS:        0.80 cm LVOT diam:     1.90 cm LV SV:         31 LV SV Index:   15 LVOT Area:     2.84 cm  LV Volumes (MOD) LV vol d, MOD A2C: 203.0 ml LV vol d, MOD A4C: 234.0 ml LV vol s, MOD A2C: 159.0 ml LV vol s, MOD A4C: 167.0 ml LV SV MOD A2C:     44.0 ml LV SV MOD A4C:     234.0 ml LV SV MOD BP:      57.4 ml RIGHT VENTRICLE RV S prime:     9.81 cm/s TAPSE (M-mode): 2.0 cm LEFT ATRIUM              Index       RIGHT ATRIUM           Index LA diam:        5.60 cm  2.61 cm/m  RA Area:     21.10 cm LA Vol (A2C):   91.9 ml  42.80 ml/m RA Volume:   57.10 ml  26.59 ml/m LA Vol (A4C):   126.0 ml 58.68 ml/m LA Biplane Vol: 111.0 ml 51.69 ml/m  AORTIC VALVE LVOT Vmax:   65.27 cm/s LVOT Vmean:  49.800 cm/s LVOT VTI:    0.111 m  AORTA Ao Root diam: 2.70 cm Ao Asc diam:  2.90 cm MR Peak grad:    65.0 mmHg   TRICUSPID VALVE MR Mean grad:    43.0 mmHg   TR Peak grad:   23.0 mmHg MR Vmax:         403.00 cm/s TR Vmax:        240.00 cm/s MR Vmean:        307.0 cm/s MR PISA:         1.57 cm    SHUNTS MR PISA Eff ROA: 12 mm      Systemic VTI:  0.11 m MR PISA Radius:  0.50 cm     Systemic Diam: 1.90 cm Dani Gobble Croitoru MD Electronically signed by Sanda Klein MD Signature Date/Time: 04/17/2020/6:01:10 PM    Final    CT HEAD CODE STROKE WO CONTRAST  Result Date: 04/17/2020 CLINICAL  DATA:  Code stroke. Neuro deficit, acute stroke suspected. Right-sided weakness. EXAM: CT HEAD WITHOUT CONTRAST TECHNIQUE: Contiguous axial images were obtained from the base of the skull through the vertex without intravenous contrast. COMPARISON:  None. FINDINGS: Brain: Ill-defined hypodensity of the left putamen (for example see series 3, image 16). No evidence of acute hemorrhage, hydrocephalus, extra-axial collection or mass lesion/mass effect. Vascular: No definite hyperdense vessel. Skull: No acute fracture. Sinuses/Orbits: Left maxillary sinus retention cyst versus polyp. Unremarkable orbits. Other: No mastoid effusions. ASPECTS [facility] Stroke Program Early CT Score) - Ganglionic level infarction (caudate, lentiform nuclei, internal capsule, insula,  M1-M3 cortex): 6 - Supraganglionic infarction (M4-M6 cortex): 3 Total score (0-10 with 10 being normal): 9 IMPRESSION: 1. Subtle hypodensity involving the left putamen, which may represent acute left MCA territory infarct. ASPECTS is 9. MRI could further characterize if clinically indicated. 2. No acute hemorrhage. Code stroke imaging results were communicated on 04/17/2020 at 10:01 am to provider Dr. Leonel Ramsay via telephone, who verbally acknowledged these results. 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 Does the need for close, 24 hr/day medical supervision in concert with the patient's rehab needs make it unreasonable for this patient to be served in a less intensive setting? Yes 2. Co-Morbidities requiring supervision/potential complications: sCHF, ICD placement, aphasia, hypotension- BP running 70s/40s 3. Due to bladder management, safety, skin/wound care, disease management, medication administration, pain management and patient education, does the patient require 24 hr/day rehab nursing? Yes 4. Does the patient require coordinated care of a  physician, rehab nurse, therapy disciplines of PT< OT, and SLP to address physical and functional deficits in the context of the above medical diagnosis(es)? Yes Addressing deficits in the following areas: balance, endurance, locomotion, strength, transferring, bowel/bladder control, bathing, dressing, feeding, grooming, toileting, speech and language 5. Can the patient actively participate in an intensive therapy program of at least 3 hrs of therapy per day at least 5 days per week? Yes 6. The potential for patient to make measurable gains while on inpatient rehab is good 7. Anticipated functional outcomes upon discharge from inpatient rehab are modified independent and supervision  with PT, modified independent and supervision with OT, modified independent and supervision with SLP. 8. Estimated rehab length of stay to reach the above functional goals is: 2-2.5 weeks 9. Anticipated discharge destination: Home 10. Overall Rehab/Functional Prognosis: good  RECOMMENDATIONS: This patient's condition is appropriate for continued rehabilitative care in the following setting: CIR Patient has agreed to participate in recommended program. Potentially Note that insurance prior authorization may be required for reimbursement for recommended care.  Comment:  1. Pt c/o discomfort with voiding- could have UTI- suggest checking U/A and Urine Cx just in case- no burning, but was in obvious discomfort when using purewick  2. Check to make sure emptying bladder- could also be cause of discomfort 3. Pt appeared more depressed than traditional stroke patient- could benefit from SSRI.  4. Pt is a great candidate for CIR- will submit for CIR admission and will d/w admission coordinators.  5. Thank you for this consult.    Lavon Paganini Angiulli, PA-C 04/19/2020    I have personally performed a face to face diagnostic evaluation of this patient and formulated the key components of the plan.  Additionally, I have  personally reviewed laboratory data, imaging studies, as well as relevant notes and concur with the physician assistant's documentation above.

## 2020-04-19 NOTE — Plan of Care (Signed)
  Problem: Self-Care: Goal: Ability to participate in self-care as condition permits will improve Outcome: Progressing Goal: Verbalization of feelings and concerns over difficulty with self-care will improve Outcome: Progressing Goal: Ability to communicate needs accurately will improve Outcome: Progressing   Problem: Nutrition: Goal: Risk of aspiration will decrease Outcome: Progressing   Problem: Ischemic Stroke/TIA Tissue Perfusion: Goal: Complications of ischemic stroke/TIA will be minimized Outcome: Progressing

## 2020-04-19 NOTE — Progress Notes (Signed)
Physical Therapy Treatment Patient Details Name: [name] MRN: [id number] DOB: 28-Jun-1970 Today's Date: 04/19/2020    History of Present Illness Pt is a 49 y/o Female who presented with right sided weakness and difficulty speaking, found to have acute left MCA M1 occlusion (suspect cardioembolic with HF) . Pt out of window for tPA; underwent mechanical thrombectomy and stent placement on 10/26. PMHx includes chronic CHF, Automatic implantable cardioverter-defibrillator in situ, HTN, MI    PT Comments    Pt has made more significant progress with speech clearing, mobility more fluid and safe.  Emphasis on guarding as necessary to give pt the chance to get OOB without assist and mobilize in the halls, while giving her challenges to balance.    Follow Up Recommendations  Home health PT;Outpatient PT     Equipment Recommendations  Other (comment) (TBA)    Recommendations for Other Services       Precautions / Restrictions Precautions Precautions: Fall (low risk) Restrictions Weight Bearing Restrictions: No    Mobility  Bed Mobility Overal bed mobility: Modified Independent                Transfers Overall transfer level: Needs assistance   Transfers: Sit to/from Stand Sit to Stand: Supervision            Ambulation/Gait Ambulation/Gait assistance: Supervision Gait Distance (Feet): 200 Feet Assistive device: None Gait Pattern/deviations: Step-through pattern   Gait velocity interpretation: >2.62 ft/sec, indicative of community ambulatory General Gait Details: steady with mild drift/list R with higher level balance challenges.  Pt able to scan, change/incr speeds, back up without incident.  Still not quite a baseline/age appropriate levels   Stairs             Wheelchair Mobility    Modified Rankin (Stroke Patients Only) Modified Rankin (Stroke Patients Only) Pre-Morbid Rankin Score: No symptoms Modified Rankin: Slight disability      Balance     Sitting balance-Leahy Scale: Normal       Standing balance-Leahy Scale: Good                              Cognition     Overall Cognitive Status: Within Functional Limits for tasks assessed                                        Exercises      General Comments        Pertinent Vitals/Pain Pain Assessment: Faces Faces Pain Scale: No hurt Pain Intervention(s): Monitored during session    Home Living                      Prior Function            PT Goals (current goals can now be found in the care plan section) Acute Rehab PT Goals Patient Stated Goal: return to independent level PT Goal Formulation: With patient Time For Goal Achievement: 05/02/20 Potential to Achieve Goals: Good Progress towards PT goals: Progressing toward goals    Frequency    Min 4X/week      PT Plan Discharge plan needs to be updated    Co-evaluation              AM-PAC PT "6 Clicks" Mobility   Outcome Measure  Help needed turning from your back  to your side while in a flat bed without using bedrails?: None Help needed moving from lying on your back to sitting on the side of a flat bed without using bedrails?: None Help needed moving to and from a bed to a chair (including a wheelchair)?: None Help needed standing up from a chair using your arms (e.g., wheelchair or bedside chair)?: None Help needed to walk in hospital room?: None Help needed climbing 3-5 steps with a railing? : A Little 6 Click Score: 23    End of Session   Activity Tolerance: Patient tolerated treatment well Patient left: in bed;with call [place]/phone within reach;with bed alarm set;with family/visitor present Nurse Communication: Mobility status PT Visit Diagnosis: Other abnormalities of gait and mobility (R26.89);Other symptoms and signs involving the nervous system (R29.898)     Time: 1901-2224 PT Time Calculation (min) (ACUTE ONLY): 26  min  Charges:  $Gait Training: 8-22 mins $Therapeutic Activity: 8-22 mins                     04/19/2020  Ginger Carne., PT Acute Rehabilitation Services [phone]  (pager) [phone]  (office)   [name] 04/19/2020, 6:16 PM

## 2020-04-19 NOTE — Progress Notes (Signed)
NAME:  [name], MRN:  [id number], DOB:  July 24, 1970, LOS: 2 ADMISSION DATE:  04/17/2020, CONSULTATION DATE:  04/17/2020 REFERRING MD: Dr. Leonel Ramsay, CHIEF COMPLAINT: Stroke  Brief History   49 yoF with hx of HFrEF with EF 20% presenting with right sided weakness and difficulty speaking found to have acute left MCA M1 occlusion.  Out of window for tPA. Intubated and taken to [facility] for mechanical thrombectomy and stent placement.  Returns to ICU intubated and sedated.   History of present illness   HPI obtained from medical chart review as patient is sedated and intubated on mechanical ventilation.   49 year old female with past medical history as below who presented to the ER as a code stroke.  She was last known well 10/25 around 2200.  Reportedly, patient woke up this morning around 0630 not feeling well, with right sided symptoms which progressed.  She was able to walk down stairs, then husband found her around 0830 with right sided paralysis and difficulty speaking.    In ER, she was normotensive and in NSR with normal oxygen saturations.   CT head showed subtle hypodensity of the left putamen which may represent acute left MCA infarct but otherwise no acute hemorrhage.  Taken for CTA head/ neck/ perfusion study which showed left M1 segment occlusion with large area of penumbra.  She was out of the window for tPA.  She was intubated and taken to Neuro IR for mechanical thrombectomy and stent placement achieving complete revascularization of occluded left MCA M1 segment.  Given her history of systolic heart failure who is managed by the HF team, HF team consulted.  Patient returns to Neuro ICU intubated and sedated on mechanical ventilation, therefore PCCM consulted for further ventilator and medical management.  Follow-up scans have been unremarkable  Past Medical History   Past Medical History:  Diagnosis Date  . Automatic implantable cardioverter-defibrillator in situ   . Chronic  CHF (congestive heart failure) (HCC)    a. EF 15-20% b. RHC (09/2013) RA 14, RV 57/22, PA 64/36 (48), PCWP 18, FIck CO/CI 3.7/1.6, PVR 8.1 WU, PA sat 47%   . History of stomach ulcers   . Hypertension   . Hypotension   . Hypothyroidism   . Morbid obesity ([place])   . Myocardial infarction ([place]) 08/2013  . Nocturnal dyspnea   . Nonischemic cardiomyopathy ([place])   . Sinus tachycardia   . Snoring-prob OSA 09/04/2011  . Sprint Fidelis ICD lead RECALL  [id number]   . UARS (upper airway resistance syndrome) 09/04/2011   HST 12/2013:  AHI 4/hr (numerous episodes of airflow reduction that did not have concomitant desaturation)    Significant Hospital Events   10/26 admitted -S/p mechanical thrombectomy and stent placement  Consults:  Neuro IR  Procedures:  10/26 endotracheal intubation 10/26 left radial A-line 10/26 mechanical thrombectomy and stent placement  Significant Diagnostic Tests:  10/26 CTH >> 1. Subtle hypodensity involving the left putamen, which may represent acute left MCA territory infarct. ASPECTS is 9. MRI could further characterize if clinically indicated. 2. No acute hemorrhage.  10/26 CTA head/ neck/ perfusion >> 1. CT perfusion positive for 64 mL of delayed perfusion in the left MCA territory. No fixed infarct on perfusion. 2. Occlusion proximal left M1 segment with good distal collateral flow to the left MCA branches. 3. No other intracranial stenosis 4. Carotid vertebral arteries in the neck were not evaluated due to lack of arterial opacification. The scan was performed early in the  pulmonary arterial phase and subsequently immediately repeated with adequate intracranial arterial enhancement. 5. Images were reviewed with Dr. Leonel Ramsay following completion of the study  MRA/ MRI  >>  Echocardiogram -Ejection fraction of 20 to 25%, mildly elevated right-sided pressures  CT head 04/17/2020 at 1825 IMPRESSION: Subtle hyperdensity is present within the left basal  ganglia, as well as within the left insular cortex and left frontal parietal cortex along the left sylvian fissure. Findings are suspected to reflect contrast staining at site of acute infarction. Trace adjacent contrast extravasation/subarachnoid hemorrhage is difficult to exclude.  Gray-white differentiation is otherwise maintained. No frank parenchymal hemorrhage.  A vascular stent is now present in the region of the M1/M2 left middle cerebral artery. Micro Data:    Antimicrobials:  Cefazolin 10/26  Interim history/subjective:  No overnight events Extubated 04/18/2020 Remains very stable  Objective   Blood pressure (!) 70/42, pulse 87, temperature 98.4 F (36.9 C), temperature source Oral, resp. rate 17, height 5' 3.5" (1.613 m), weight 114.5 kg, SpO2 99 %.    Vent Mode: PSV;CPAP FiO2 (%):  [40 %] 40 % Pressure Support:  [10 cmH20] 10 cmH20   Intake/Output Summary (Last 24 hours) at 04/19/2020 0816 Last data filed at 04/19/2020 0400 Gross per 24 hour  Intake 373.13 ml  Output 950 ml  Net -576.87 ml   Filed Weights   04/17/20 0900  Weight: 114.5 kg    Examination: General: Awake and alert, moving all extremities HENT: On room air, moist oral mucosa Lungs: Clear breath sounds bilaterally Cardiovascular: S1-S2 appreciated Abdomen: Soft, bowel sounds appreciated Extremities: No clubbing, no edema Neuro: Awake, interactive GU: Fair urine output  Chest x-ray 10/27-reviewed by myself -Retrocardiac infiltrate  Resolved Hospital Problem list     Assessment & Plan:  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 s/p complete revascularization with mechanical thrombectomy and stent placement -Neurology following -On Brilinta -Neo-Synephrine discontinued 10/27  Respiratory insufficiency -Resolved  Retrocardiac infiltrate -Repeat chest x-ray -Has no symptoms of a respiratory tract infection  Heart failure with reduced ejection fraction -EF of 20% -On  digoxin, Farxiga, amiodarone -Antihypertensives on hold-Coreg, metolazone, spironolactone, torsemide -Heart failure team following  Hypertension -Hold antihypertensives  Hypothyroidism -Continue Synthroid  Repeat chest x-ray today  Best practice:  Diet: Heart healthy Pain/Anxiety/Delirium protocol (if indicated): Tylenol as needed VAP protocol (if indicated): Not indicated DVT prophylaxis: Lovenox GI prophylaxis:  Glucose control: Monitor Mobility: Bedrest Code Status: Full code Family Communication: Spouse at bedside Disposition: ICU  Labs   CBC: Recent Labs  Lab 04/17/20 0949 04/17/20 1000 04/17/20 1630 04/17/20 1958 04/18/20 0950  WBC 9.1  --   --  14.5* 16.0*  NEUTROABS 6.6  --   --   --  13.0*  HGB 11.2* 11.9* 11.9* 11.2* 9.6*  HCT 38.0 35.0* 35.0* 36.6 32.5*  MCV 82.1  --   --  79.9* 80.2  PLT 304  --   --  421* 962    Basic Metabolic Panel: Recent Labs  Lab 04/17/20 0949 04/17/20 1000 04/17/20 1630 04/17/20 1958 04/18/20 0950  NA 138 140 141  --  142  K 3.8 3.8 4.0  --  3.5  CL 106 107  --   --  115*  CO2 22  --   --   --  19*  GLUCOSE 106* 106*  --   --  91  BUN 16 17  --   --  9  CREATININE 1.28* 1.20*  --   --  0.89  CALCIUM 9.0  --   --   --  8.2*  MG  --   --   --  1.7 1.6*  PHOS  --   --   --   --  2.8   GFR: Estimated Creatinine Clearance: 94.2 mL/min (by C-G formula based on SCr of 0.89 mg/dL). Recent Labs  Lab 04/17/20 0949 04/17/20 1958 04/18/20 0950  WBC 9.1 14.5* 16.0*    Liver Function Tests: Recent Labs  Lab 04/17/20 0949 04/18/20 0950  AST 18  --   ALT 14  --   ALKPHOS 54  --   BILITOT 0.6  --   PROT 6.9  --   ALBUMIN 3.4* 2.8*   No results for input(s): LIPASE, AMYLASE in the last 168 hours. No results for input(s): AMMONIA in the last 168 hours.  ABG    Component Value Date/Time   PHART 7.341 (L) 04/17/2020 1630   PCO2ART 41.4 04/17/2020 1630   PO2ART 136 (H) 04/17/2020 1630   HCO3 22.4 04/17/2020 1630     TCO2 24 04/17/2020 1630   ACIDBASEDEF 3.0 (H) 04/17/2020 1630   O2SAT 99.0 04/17/2020 1630     Coagulation Profile: Recent Labs  Lab 04/17/20 0949  INR 1.1    Cardiac Enzymes: No results for input(s): CKTOTAL, CKMB, CKMBINDEX, TROPONINI in the last 168 hours.  HbA1C: Hemoglobin A1C  Date/Time Value Ref Range Status  09/19/2013 02:09 PM 5.9 4.2 - 6.3 % Final    Comment:    The American Diabetes Association recommends that a primary goal of therapy should be <7% and that physicians should reevaluate the treatment regimen in patients with HbA1c values consistently >8%.    Hgb A1c MFr Bld  Date/Time Value Ref Range Status  04/18/2020 09:50 AM 5.5 4.8 - 5.6 % Final    Comment:    (NOTE)         Prediabetes: 5.7 - 6.4         Diabetes: >6.4         Glycemic control for adults with diabetes: <7.0   12/01/2019 04:13 PM 5.3 4.8 - 5.6 % Final    Comment:             Prediabetes: 5.7 - 6.4          Diabetes: >6.4          Glycemic control for adults with diabetes: <7.0     CBG: Recent Labs  Lab 04/17/20 0949  GLUCAP 109*    Review of Systems:   Comfortable Denies any pain or discomfort  Past Medical History  She,  has a past medical history of Automatic implantable cardioverter-defibrillator in situ, Chronic CHF (congestive heart failure) ([place]), History of stomach ulcers, Hypertension, Hypotension, Hypothyroidism, Morbid obesity ([place]), Myocardial infarction ([place]) (08/2013), Nocturnal dyspnea, Nonischemic cardiomyopathy ([place]), Sinus tachycardia, Snoring-prob OSA (09/04/2011), Sprint Fidelis ICD lead RECALL  6949, and UARS (upper airway resistance syndrome) (09/04/2011).   Surgical History    Past Surgical History:  Procedure Laterality Date  . BREATH TEK H PYLORI N/A 11/09/2014   Procedure: BREATH TEK H PYLORI;  Surgeon: Greer Pickerel, MD;  Location: [facility];  Service: General;  Laterality: N/A;  . CARDIAC CATHETERIZATION  ~ 2006; 09/2013  . CARDIAC CATHETERIZATION  N/A 05/23/2016   Procedure: Right Heart Cath;  Surgeon: Larey Dresser, MD;  Location: [facility] CV LAB;  Service: Cardiovascular;  Laterality: N/A;  . CARDIAC DEFIBRILLATOR PLACEMENT  2006; 12/26/2013   Medtronic [id number];  12-2013 ICD gen change and RV lead revision with new 9528 RV lead by Dr [name]  . CESAREAN SECTION  1999  . IMPLANTABLE CARDIOVERTER DEFIBRILLATOR GENERATOR CHANGE N/A 12/26/2013   Procedure: IMPLANTABLE CARDIOVERTER DEFIBRILLATOR GENERATOR CHANGE;  Surgeon: Deboraha Sprang, MD;  Location: [facility] CATH LAB;  Service: Cardiovascular;  Laterality: N/A;  . IR CT HEAD LTD  04/17/2020  . IR CT HEAD LTD  04/17/2020  . IR INTRA CRAN STENT  04/17/2020  . IR PERCUTANEOUS ART THROMBECTOMY/INFUSION INTRACRANIAL INC DIAG ANGIO  04/17/2020  . LEAD REVISION N/A 12/26/2013   Procedure: LEAD REVISION;  Surgeon: Deboraha Sprang, MD;  Location: [facility] CATH LAB;  Service: Cardiovascular;  Laterality: N/A;  . RADIOLOGY WITH ANESTHESIA N/A 04/17/2020   Procedure: Code Stroke;  Surgeon: Luanne Bras, MD;  Location: [facility];  Service: Radiology;  Laterality: N/A;  . RIGHT HEART CATHETERIZATION N/A 02/15/2014   Procedure: RIGHT HEART CATH;  Surgeon: Larey Dresser, MD;  Location: [facility] CATH LAB;  Service: Cardiovascular;  Laterality: N/A;  . [place] History   reports that she has never smoked. She has never used smokeless tobacco. She reports that she does not drink alcohol and does not use drugs.   Family History   Her family history includes Heart disease in her maternal grandmother and mother; High blood pressure in her father; Obesity in her mother; Stroke in her father.   Allergies No Known Allergies   The patient is critically ill with multiple organ systems failure and requires high complexity decision making for assessment and support, frequent evaluation and titration of therapies, application of advanced monitoring technologies and extensive interpretation of  multiple databases. Critical Care Time devoted to patient care services described in this note independent of APP/resident time (if applicable)  is 32 minutes.   Sherrilyn Rist MD [facility] Personal pager: [phone] If unanswered, please page CCM On-call: [phone]

## 2020-04-20 ENCOUNTER — Other Ambulatory Visit (HOSPITAL_COMMUNITY): Payer: Self-pay | Admitting: Internal Medicine

## 2020-04-20 LAB — BASIC METABOLIC PANEL
Anion gap: 12 (ref 5–15)
BUN: 9 mg/dL (ref 6–20)
CO2: 26 mmol/L (ref 22–32)
Calcium: 9 mg/dL (ref 8.9–10.3)
Chloride: 105 mmol/L (ref 98–111)
Creatinine, Ser: 1.21 mg/dL — ABNORMAL HIGH (ref 0.44–1.00)
GFR, Estimated: 55 mL/min — ABNORMAL LOW (ref >60)
Glucose, Bld: 93 mg/dL (ref 70–99)
Potassium: 2.8 mmol/L — ABNORMAL LOW (ref 3.5–5.1)
Sodium: 143 mmol/L (ref 135–145)

## 2020-04-20 LAB — MAGNESIUM: Magnesium: 1.9 mg/dL (ref 1.7–2.4)

## 2020-04-20 MED ORDER — CARVEDILOL 6.25 MG PO TABS
6.2500 mg | ORAL_TABLET | Freq: Two times a day (BID) | ORAL | 0 refills | Status: DC
Start: 2020-04-20 — End: 2020-05-22

## 2020-04-20 MED ORDER — POTASSIUM CHLORIDE CRYS ER 20 MEQ PO TBCR
40.0000 meq | EXTENDED_RELEASE_TABLET | Freq: Once | ORAL | Status: AC
  Administered 2020-04-20: 40 meq via ORAL
  Filled 2020-04-20: qty 2

## 2020-04-20 MED ORDER — TORSEMIDE 20 MG PO TABS
80.0000 mg | ORAL_TABLET | Freq: Every morning | ORAL | 0 refills | Status: DC
Start: 2020-04-20 — End: 2020-05-22

## 2020-04-20 MED ORDER — ASPIRIN 81 MG PO CHEW
81.0000 mg | CHEWABLE_TABLET | Freq: Every day | ORAL | 11 refills | Status: DC
Start: 2020-04-21 — End: 2021-05-02

## 2020-04-20 MED ORDER — TICAGRELOR 90 MG PO TABS
90.0000 mg | ORAL_TABLET | Freq: Two times a day (BID) | ORAL | 11 refills | Status: DC
Start: 2020-04-20 — End: 2021-05-02

## 2020-04-20 MED ORDER — MAGNESIUM SULFATE 2 GM/50ML IV SOLN
2.0000 g | Freq: Once | INTRAVENOUS | Status: AC
  Administered 2020-04-20: 2 g via INTRAVENOUS
  Filled 2020-04-20: qty 50

## 2020-04-20 MED ORDER — POTASSIUM CHLORIDE CRYS ER 20 MEQ PO TBCR: 40.0000 meq | EXTENDED_RELEASE_TABLET | Freq: Two times a day (BID) | ORAL | Status: DC

## 2020-04-20 MED ORDER — POTASSIUM CHLORIDE CRYS ER 20 MEQ PO TBCR
40.0000 meq | EXTENDED_RELEASE_TABLET | Freq: Three times a day (TID) | ORAL | 0 refills | Status: DC
Start: 2020-04-20 — End: 2020-04-25

## 2020-04-20 MED ORDER — TORSEMIDE 20 MG PO TABS
40.0000 mg | ORAL_TABLET | Freq: Every evening | ORAL | 0 refills | Status: DC
Start: 2020-04-20 — End: 2020-05-07

## 2020-04-20 MED ORDER — POTASSIUM CHLORIDE CRYS ER 20 MEQ PO TBCR
40.0000 meq | EXTENDED_RELEASE_TABLET | Freq: Three times a day (TID) | ORAL | Status: DC
  Administered 2020-04-20: 40 meq via ORAL
  Filled 2020-04-20: qty 2

## 2020-04-20 MED ORDER — CARVEDILOL 6.25 MG PO TABS
6.2500 mg | ORAL_TABLET | Freq: Two times a day (BID) | ORAL | Status: DC
  Administered 2020-04-20: 6.25 mg via ORAL
  Filled 2020-04-20: qty 1

## 2020-04-20 MED FILL — ASPIRIN LOW DOSE 81 MG CHEW: 81 | 30 days supply | Qty: 30 | Fill #0

## 2020-04-20 MED FILL — BRILINTA 90 MG TABLET: 90 | 30 days supply | Qty: 60 | Fill #0

## 2020-04-20 MED FILL — CARVEDILOL 6.25 MG TABLET: 6.25 | 30 days supply | Qty: 60 | Fill #0

## 2020-04-20 MED FILL — POTASSIUM CHLORIDE 20meqER: 20 | 7 days supply | Qty: 42 | Fill #0

## 2020-04-20 MED FILL — TORSEMIDE 20 MG TABLET: 20 | 25 days supply | Qty: 150 | Fill #0

## 2020-04-20 NOTE — Progress Notes (Signed)
Physical Therapy Treatment Patient Details Name: [name] MRN: [id number] DOB: 26-Sep-1970 Today's Date: 04/20/2020    History of Present Illness Pt is a 49 y/o Female who presented with right sided weakness and difficulty speaking, found to have acute left MCA M1 occlusion (suspect cardioembolic with HF) . Pt out of window for tPA; underwent mechanical thrombectomy and stent placement on 10/26. PMHx includes chronic CHF, Automatic implantable cardioverter-defibrillator in situ, HTN, MI    PT Comments    Pt has made remarkable progress.  She is essentially back to baseline functioning as far is mobility.  She does report small delay in processing still.  Will sign off from Acute PT at this time and pt doesn't even need follow up PT.    Follow Up Recommendations  Home health PT;Outpatient PT     Equipment Recommendations  Other (comment) (TBA)    Recommendations for Other Services       Precautions / Restrictions Precautions Precautions: Fall (low risk)    Mobility  Bed Mobility Overal bed mobility: Modified Independent                Transfers Overall transfer level: Modified independent   Transfers: Sit to/from Stand Sit to Stand: Supervision            Ambulation/Gait Ambulation/Gait assistance: Independent;Modified independent (Device/Increase time) Gait Distance (Feet): 1000 Feet Assistive device: None Gait Pattern/deviations: Step-through pattern   Gait velocity interpretation: >4.37 ft/sec, indicative of normal walking speed General Gait Details: steady, able to make abrupt turns, walk backward, scan behind while walking foward, increase speeds without any deviation.  Even follows command "fired out" to her without any discernible delay   Stairs Stairs: Yes Stairs assistance: Supervision Stair Management: One rail Right;Alternating pattern;Forwards Number of Stairs: 20 General stair comments: safe with rail.  Stairs tire pt out, but pt/husband  report better now than baseline with fluid levels down.   Wheelchair Mobility    Modified Rankin (Stroke Patients Only) Modified Rankin (Stroke Patients Only) Pre-Morbid Rankin Score: No symptoms Modified Rankin: Slight disability     Balance     Sitting balance-Leahy Scale: Normal       Standing balance-Leahy Scale: Good                              Cognition     Overall Cognitive Status: Within Functional Limits for tasks assessed (pt report just shy of her baseline)                                        Exercises      General Comments        Pertinent Vitals/Pain Pain Assessment: No/denies pain Faces Pain Scale: No hurt Pain Intervention(s): Monitored during session    Home Living                      Prior Function            PT Goals (current goals can now be found in the care plan section) Acute Rehab PT Goals Patient Stated Goal: return to independent level PT Goal Formulation: With patient Time For Goal Achievement: 05/02/20 Potential to Achieve Goals: Good Progress towards PT goals: Goals met/education completed, patient discharged from PT    Frequency    Min 4X/week  PT Plan      Co-evaluation              AM-PAC PT "6 Clicks" Mobility   Outcome Measure  Help needed turning from your back to your side while in a flat bed without using bedrails?: None Help needed moving from lying on your back to sitting on the side of a flat bed without using bedrails?: None Help needed moving to and from a bed to a chair (including a wheelchair)?: None Help needed standing up from a chair using your arms (e.g., wheelchair or bedside chair)?: None Help needed to walk in hospital room?: None Help needed climbing 3-5 steps with a railing? : None 6 Click Score: 24    End of Session   Activity Tolerance: Patient tolerated treatment well Patient left: in bed;with call Courtney/phone within reach;with bed  alarm set;with family/visitor present Nurse Communication: Mobility status PT Visit Diagnosis: Other abnormalities of gait and mobility (R26.89);Other symptoms and signs involving the nervous system (R29.898)     Time: 1427-6701 PT Time Calculation (min) (ACUTE ONLY): 26 min  Charges:  $Gait Training: 8-22 mins $Therapeutic Activity: 8-22 mins                     04/20/2020  Ginger Carne., PT Acute Rehabilitation Services [phone]  (pager) [phone]  (office)   Tessie Fass Lister Brizzi 04/20/2020, 12:02 PM

## 2020-04-20 NOTE — Progress Notes (Addendum)
STROKE TEAM PROGRESS NOTE   INTERVAL HISTORY  Her husband is at the bedside with [name]. She is sitting up at bedside and is feeling much better with improvement in strength. Discussed importance of taking her brillinta and aspirin at discharge and follow-up in six weeks. All questions answered.    Vitals:   04/20/20 0500 04/20/20 0600 04/20/20 0656 04/20/20 0753  BP:    (!) 89/71  Pulse: 78 65 83 85  Resp: 17 16 16 14   Temp:    98 F (36.7 C)  TempSrc:    Oral  SpO2: 100% 100% 99% 100%  Weight:   115.3 kg   Height:       CBC:  Recent Labs  Lab 04/17/20 0949 04/17/20 1000 04/17/20 1958 04/18/20 0950  WBC 9.1   < > 14.5* 16.0*  NEUTROABS 6.6  --   --  13.0*  HGB 11.2*   < > 11.2* 9.6*  HCT 38.0   < > 36.6 32.5*  MCV 82.1   < > 79.9* 80.2  PLT 304   < > 421* 294   < > = values in this interval not displayed.   Basic Metabolic Panel:  Recent Labs  Lab 04/18/20 0950 04/20/20 0303  NA 142 143  K 3.5 2.8*  CL 115* 105  CO2 19* 26  GLUCOSE 91 93  BUN 9 9  CREATININE 0.89 1.21*  CALCIUM 8.2* 9.0  MG 1.6* 1.9  PHOS 2.8  --    Lipid Panel:  Recent Labs  Lab 04/18/20 0950  CHOL 198  TRIG 92  HDL 50  CHOLHDL 4.0  VLDL 18  LDLCALC 130*   HgbA1c: 5.3 12/01/2019 Urine Drug Screen: pending  Alcohol Level No results for input(s): ETH in the last 168 hours.  IMAGING past 24 hours DG CHEST PORT 1 VIEW  Result Date: 04/19/2020 CLINICAL DATA:  Stroke. EXAM: PORTABLE CHEST 1 VIEW COMPARISON:  April 18, 2020. FINDINGS: Moderate cardiomegaly is again noted. Left-sided pacemaker is unchanged in position. Endotracheal and nasogastric tubes have been removed. No pneumothorax or pleural effusion is noted. Both lungs are clear. The visualized skeletal structures are unremarkable. IMPRESSION: Stable cardiomegaly. No acute cardiopulmonary abnormality seen. Endotracheal and nasogastric tubes have been removed. Electronically Signed   By: Marijo Conception M.D.   On: 04/19/2020  09:28    PHYSICAL EXAM  Constitution: NAD, sitting up at bedside  Cardio: regular rate & rhythm Respiratory: non-labored breathing, on room air Neuro: Mental Status: Patient is awake, alert and oriented x 3, answering questions and able to follow commands, repetition, naming intact, with possibly very slight aphasia.   Cranial Nerves: II: visual fields intact  III,IV, VI: EOMI without ptosis or diploplia. Pupils equal, round and reactive to light V: facial sensation intact VII: smile symmetric, no facial droop VIII: hearing is intact to voice XI: Shoulder shrug is symmetric. XII: tongue midline, no fasculations  Motor: right: strength 5/5 and symmetric, no upper or lower extremity drift  left: strength 5/5, no upper or lower extremity drift   Sensory: Sensation intact  ASSESSMENT/PLAN [name] is a 49 y.o. 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   Left middle cerebral artery embolic Stroke s/p left M1 occlusion with  successful mechanical thrombectomy  Code Stroke CT head showed subtle hypodensity of left putamen, ASPECTS 9.   CT perfusion with 64 cc delay in left MCA territory and 0cc CBF and collateral flow to left MCA branches. Patient went for intervention with thrombectomy and stent placement of left M1 occlusion.     Post-stent CT 10/27 shows continued mild hypodensity of left putamen without other loss of grey-white differentiation or hemorrhage with stent at M1/M2 left MCA  CTA 10/28 with left MCA stent with nonocclusive thrombus along the posterior wall of the mid stent.   Goal bp systolic <573 from neurological standpoint.   Unable to obtain MRI/MRA due to ICD    2D Echo without thrombus, EF 20-25% and global hypokinesis, severely dilated LV w/grade III diastolic dysfunction. RVP 38 mmHg, decreased IVC respiratory variability, EF similar to prior 09/27/19  ICD negative for atrial fibrillation. Continue brillinta 90 mg bid and asa 81 mg qd.     LDL 127  HgbA1c 5.3  VTE prophylaxis - lovenox  Continue PT/OT     Diet   Diet Heart Room service appropriate? Yes with Assist; Fluid consistency: Thin    Therapy recommendations:  CIR or outpatient PT - will follow-up with PT/OT today  Disposition: Stable for discharge from neurological standpoint pending PT/OT evaluation today for CIR vs. HHPT. With EF 20-25% without thrombus, ICD negative for atrial fibrillation. Recommend continuing brilinta 90 mg bid and asa 81 mg. Will possibly switch to warfarin in the future per cardiology with concern for thrombus being an ongoing concern. Follow-up in neurology clinic in six weeks.   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   BP Stable  Hyperlipidemia  Home meds:  Atorvastatin 40 mg, resumed in hospital   LDL 127, goal < 70  Continue statin at discharge  Other Stroke Risk Factors  Substance abuse - UDS: negative  Obesity, Body mass index is 44.32 kg/m., recommend weight loss, diet and exercise as appropriate   Family hx stroke (Father)  Coronary artery disease, MI  Obstructive sleep apnea, recently diagnosed - on CPAP at home  Systolic ongestive heart failure  Other Active Problems  Hypothyroidism - on levothyroxine   OSA - CPAP ordered I have personally obtained history,examined this patient, reviewed notes, independently viewed imaging studies, participated in medical  decision making and plan of care.ROS completed by me personally and pertinent positives fully documented  I have made any additions or clarifications directly to the above note. Agree with note above. Patient is doing well and likely to be discharged later today as therapist feels she has improved and no longer needs inpatient rehab.  Follow-up as an outpatient stroke clinic in 6 weeks and with the heart failure clinic.  Antony Contras, MD Medical Director [facility] Pager: [phone] 04/20/2020 1:08 PM  To contact Stroke Continuity provider, please refer to [email or web address] After hours, contact General Neurology

## 2020-04-20 NOTE — TOC Transition Note (Signed)
Transition of Care [facility]) - CM/SW Discharge Note   Patient Details  Name: [name] MRN: [id number] Date of Birth: 01/23/1971  [facility] CM/SW Contact:  Ella Bodo, RN Phone Number: 04/20/2020, 2:18 PM   Clinical Narrative:  Pt is a 49 y/o Female who presented with right sided weakness and difficulty speaking, found to have acute left MCA M1 occlusion (suspect cardioembolic with HF) . Pt out of window for tPA; underwent mechanical thrombectomy and stent placement on 10/26.   PTA, pt independent of ADLS, lives with spouse, who can provide assistance at dc.  PT/OT recommending no OP follow up.  Pt to dc on Brilinta and ASA; provided Brilinta copay card to patient with instructions for use.  Pt denies any other needs for home.       Final next level of care: Home/Self Care Barriers to Discharge: Barriers Resolved     Readmission Risk Interventions Readmission Risk Prevention Plan 04/20/2020  Transportation Screening Complete  PCP or Specialist Appt within 5-7 Days Complete  Home Care Screening Complete  Medication Review (RN CM) Complete  Some recent data might be hidden   Reinaldo Raddle, RN, BSN  Trauma/Neuro ICU Case Manager [phone]

## 2020-04-20 NOTE — Progress Notes (Signed)
Inpatient Rehabilitation Admissions Coordinator  I met with patient and her spouse at bedside. She is no longer in need of a [facility] admit. I have alerted Attending service, acute team and TOC. We will sign off at this time.  Danne Baxter, RN, MSN Rehab Admissions Coordinator [phone] 04/20/2020 12:46 PM

## 2020-04-20 NOTE — Progress Notes (Addendum)
Advanced Heart Failure Rounding Note  PCP-Cardiologist: [name], MD   Subjective:    Yesterday extubated and now on. Torsemide was started back at 80 qam/40 qpm.   10/27 Device interrogation - no events. Fluid index well above threshold. Impedance dow. Suggestive of fluid accumulation.   Yesterday amiodarone increased to 200 mg twice a day.   Denies SOB.    Objective:   Weight Range: 115.3 kg Body mass index is 44.32 kg/m.   Vital Signs:   Temp:  [97.4 F (36.3 C)-98.5 F (36.9 C)] 98 F (36.7 C) (10/29 0753) Pulse Rate:  [65-106] 85 (10/29 0753) Resp:  [14-24] 14 (10/29 0753) BP: (89-110)/(46-84) 89/71 (10/29 0753) SpO2:  [91 %-100 %] 100 % (10/29 0753) Arterial Line BP: (108)/(92) 108/92 (10/28 1200) Weight:  [115.3 kg] 115.3 kg (10/29 0656) Last BM Date:  (PTA)  Weight change: Filed Weights   04/17/20 0900 04/20/20 0656  Weight: 114.5 kg 115.3 kg    Intake/Output:   Intake/Output Summary (Last 24 hours) at 04/20/2020 1029 Last data filed at 04/20/2020 0932 Gross per 24 hour  Intake 480 ml  Output 4050 ml  Net -3570 ml      Physical Exam  General:   No resp difficulty HEENT: normal Neck: supple. JVP 7-8 . Carotids 2+ bilat; no bruits. No lymphadenopathy or thryomegaly appreciated. Cor: PMI nondisplaced. Regular rate & rhythm. No rubs, gallops or murmurs. Lungs: clear Abdomen: soft, nontender, nondistended. No hepatosplenomegaly. No bruits or masses. Good bowel sounds. Extremities: no cyanosis, clubbing, rash, edema Neuro: alert & orientedx3, cranial nerves grossly intact. moves all 4 extremities w/o difficulty. Affect pleasant   Telemetry  SR 80s with had few brief runs of NSVT.   EKG   n/a  Labs    CBC Recent Labs    04/17/20 1958 04/18/20 0950  WBC 14.5* 16.0*  NEUTROABS  --  13.0*  HGB 11.2* 9.6*  HCT 36.6 32.5*  MCV 79.9* 80.2  PLT 421* 702   Basic Metabolic Panel Recent Labs    04/18/20 0950 04/20/20 0303  NA  142 143  K 3.5 2.8*  CL 115* 105  CO2 19* 26  GLUCOSE 91 93  BUN 9 9  CREATININE 0.89 1.21*  CALCIUM 8.2* 9.0  MG 1.6* 1.9  PHOS 2.8  --    Liver Function Tests Recent Labs    04/18/20 0950  ALBUMIN 2.8*   No results for input(s): LIPASE, AMYLASE in the last 72 hours. Cardiac Enzymes No results for input(s): CKTOTAL, CKMB, CKMBINDEX, TROPONINI in the last 72 hours.  BNP: BNP (last 3 results) Recent Labs    01/26/20 1030  BNP 929.8*    ProBNP (last 3 results) No results for input(s): PROBNP in the last 8760 hours.   D-Dimer No results for input(s): DDIMER in the last 72 hours. Hemoglobin A1C Recent Labs    04/18/20 0950  HGBA1C 5.5   Fasting Lipid Panel Recent Labs    04/18/20 0950  CHOL 198  HDL 50  LDLCALC 130*  TRIG 92  CHOLHDL 4.0   Thyroid Function Tests No results for input(s): TSH, T4TOTAL, T3FREE, THYROIDAB in the last 72 hours.  Invalid input(s): FREET3  Other results:   Imaging    No results found.   Medications:     Scheduled Medications: .  stroke: mapping our early stages of recovery book   Does not apply Once  . amiodarone  200 mg Oral BID  . aspirin  81 mg  Oral Daily  . atorvastatin  40 mg Oral Daily  . carvedilol  3.125 mg Oral BID WC  . chlorhexidine  15 mL Mouth Rinse BID  . Chlorhexidine Gluconate Cloth  6 each Topical Daily  . dapagliflozin propanediol  10 mg Oral Daily  . digoxin  0.125 mg Oral Daily  . enoxaparin (LOVENOX) injection  40 mg Subcutaneous Q24H  . levothyroxine  25 mcg Oral QAC breakfast  . mouth rinse  15 mL Mouth Rinse q12n4p  . potassium chloride  40 mEq Oral Once  . potassium chloride  40 mEq Oral TID  . sodium chloride flush  3 mL Intravenous Once  . spironolactone  25 mg Oral Daily  . ticagrelor  90 mg Oral BID  . torsemide  40 mg Oral QPM  . torsemide  80 mg Oral Daily    Infusions: . sodium chloride Stopped (04/18/20 0953)  . 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  Admitted with LMCA stroke--cardioembolic with heart failure.   Assessment/Plan  1. 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  - IR performed S/P  Thrombectomy with stent.  - On asa + brillinta.   2. Chronic Systolic HF NICM dating back to 2015. Possible Peripartum. Had [employer]onic ICD ECHO 04/17/20  EF ~20% RV ok, no LV thrombus visualized.  Hold bb, arni, SGT2i with stroke.  -Volume status was up on Optivol.  - Torsemide 80 mg daily started 10/27. Volume status improved today. Continue torsemide 80 mg /40 mg daily.  -K 2.8. Increase K to 40 meq three times a day.  - BP has been low. Continue carvedilol 3.125 mg twice a day.   - Continue spiro 25 mg daily - Continue farxiga 10 mg daily  - Renal function stable.   3. H/O VT /PVCs High PVC burden. Increase amio 200 mg twice a day - Give 2 grams Mag today.  - Supp K.  - No VT on device interrogation.   4.  Hypothyroidism On levothyroxine   5. OSA Recently diagnosed.  6. Hypokalemia Increase K. 40 meq three times a day. CHeck BMET in am.   Length of Stay: [place], NP  04/20/2020, 10:29 AM  Advanced Heart Failure Team Pager [phone] (M-F; 7a - 4p)  Please contact [facility] for night-coverage after hours (4p -7a ) and weekends on [email or web address]  Patient seen with NP, agree with the above note.   She is stable today, walking and feels like strength is back to normal.  We restarted her home diuretic yesterday and she diuresed well.  K is now low and will be replaced.  We are not able to restart her home cardiac regimen yet due to soft BP I am not sure that BP is  accurate as arterial line read about 20 points higher than the cuff when she had it in (SBP 100s off cuff). Will see if we can use a larger cuff or her leg to get a higher reading.  For now, will increase Coreg to 6.25 mg bid and continue to leave her off Entresto.   She remains on ASA/Brilinta post-IR intervention with  stent.  I am still concerned for cardioembolic source (LV thrombus) and would like her eventually on warfarin. No evidence for atrial fibrillation from device interrogation.   Still with frequent PVCs, amiodarone increased transiently to bid, decrease to qd at discharge.   [name] 04/20/2020 11:18 AM

## 2020-04-20 NOTE — Discharge Summary (Addendum)
Stroke Discharge Summary  Patient ID: [name]   MRN: [id number]      DOB: [birth year]  Date of Admission: 04/17/2020 Date of Discharge: 04/20/2020  Attending Physician:  Garvin Fila, MD, Stroke MD Consultant(s):   Treatment Team:  Stroke, Md, MD [facility], Rounding, MD  Patient's PCP:  [name], MD  DISCHARGE DIAGNOSIS:  Active Problems:   Stroke (cerebrum) [facility])   Middle cerebral artery embolism, left   Allergies as of 04/20/2020   No Known Allergies      Medication List     STOP taking these medications    Entresto 49-51 MG Generic drug: sacubitril-valsartan       TAKE these medications    amiodarone 200 MG tablet Commonly known as: PACERONE Take 1 tablet (200 mg total) by mouth daily.   aspirin 81 MG chewable tablet Chew 1 tablet (81 mg total) by mouth daily. Start taking on: April 21, 2020   atorvastatin 40 MG tablet Commonly known as: LIPITOR TAKE 1 TABLET BY MOUTH ONCE A DAY   carvedilol 6.25 MG tablet Commonly known as: COREG Take 1 tablet (6.25 mg total) by mouth 2 (two) times daily with a meal. What changed:  medication strength See the new instructions.   Corlanor 5 MG Tabs tablet Generic drug: ivabradine TAKE 1 TABLET BY MOUTH TWICE (2) DAILY WITH MEALS What changed: See the new instructions.   dapagliflozin propanediol 10 MG Tabs tablet Commonly known as: FARXIGA Take 10 mg by mouth daily before breakfast.   digoxin 0.125 MG tablet Commonly known as: LANOXIN TAKE 1 TABLET BY MOUTH ONCE A DAY   levothyroxine 25 MCG tablet Commonly known as: Synthroid Take 1 tablet (25 mcg total) by mouth daily before breakfast.   metolazone 2.5 MG tablet Commonly known as: ZAROXOLYN Take 1 tablet (2.5 mg total) by mouth as needed (extra fluid as directed by HF clinic).   potassium chloride SA 20 MEQ tablet Commonly known as: KLOR-CON Take 2 tablets (40 mEq total) by mouth 3 (three) times daily. What changed: when to  take this   spironolactone 25 MG tablet Commonly known as: ALDACTONE TAKE 1 TABLET BY MOUTH EVERY NIGHT AT BEDTIME   ticagrelor 90 MG Tabs tablet Commonly known as: BRILINTA Take 1 tablet (90 mg total) by mouth 2 (two) times daily.   torsemide 20 MG tablet Commonly known as: DEMADEX Take 4 tablets (80 mg total) by mouth in the morning. What changed: See the new instructions.   torsemide 20 MG tablet Commonly known as: DEMADEX Take 2 tablets (40 mg total) by mouth every evening. What changed: You were already taking a medication with the same name, and this prescription was added. Make sure you understand how and when to take each.   Vitamin D (Ergocalciferol) 1.25 MG (50000 UNIT) Caps capsule Commonly known as: DRISDOL Take 1 capsule (50,000 Units total) by mouth every 7 (seven) days.        LABORATORY STUDIES CBC    Component Value Date/Time   WBC 16.0 (H) 04/18/2020 0950   RBC 4.05 04/18/2020 0950   HGB 9.6 (L) 04/18/2020 0950   HGB 13.6 10/05/2017 1218   HCT 32.5 (L) 04/18/2020 0950   HCT 41.7 10/05/2017 1218   PLT 294 04/18/2020 0950   PLT 195 09/23/2013 0443   MCV 80.2 04/18/2020 0950   MCV 86 10/05/2017 1218   MCV 85 09/23/2013 0443   MCH 23.7 (L) 04/18/2020 0950   MCHC 29.5 (  L) 04/18/2020 0950   RDW 16.8 (H) 04/18/2020 0950   RDW 14.5 10/05/2017 1218   RDW 15.4 (H) 09/23/2013 0443   LYMPHSABS 1.6 04/18/2020 0950   LYMPHSABS 1.9 10/05/2017 1218   LYMPHSABS 2.0 09/23/2013 0443   MONOABS 1.3 (H) 04/18/2020 0950   MONOABS 0.4 09/23/2013 0443   EOSABS 0.0 04/18/2020 0950   EOSABS 0.2 10/05/2017 1218   EOSABS 0.1 09/23/2013 0443   BASOSABS 0.0 04/18/2020 0950   BASOSABS 0.0 10/05/2017 1218   BASOSABS 0.1 09/23/2013 0443   CMP    Component Value Date/Time   NA 143 04/20/2020 0303   NA 138 02/08/2020 0948   NA 138 09/24/2013 0536   K 2.8 (L) 04/20/2020 0303   K 3.7 09/24/2013 0536   CL 105 04/20/2020 0303   CL 107 09/24/2013 0536   CO2 26  04/20/2020 0303   CO2 25 09/24/2013 0536   GLUCOSE 93 04/20/2020 0303   GLUCOSE 82 09/24/2013 0536   BUN 9 04/20/2020 0303   BUN 18 02/08/2020 0948   BUN 11 09/24/2013 0536   CREATININE 1.21 (H) 04/20/2020 0303   CREATININE 1.08 09/24/2013 0536   CALCIUM 9.0 04/20/2020 0303   CALCIUM 8.6 09/24/2013 0536   PROT 6.9 04/17/2020 0949   PROT 7.8 03/10/2018 1507   PROT 7.7 09/19/2013 0431   ALBUMIN 2.8 (L) 04/18/2020 0950   ALBUMIN 4.5 03/10/2018 1507   ALBUMIN 3.5 09/19/2013 0431   AST 18 04/17/2020 0949   AST 36 09/19/2013 0431   ALT 14 04/17/2020 0949   ALT 32 09/19/2013 0431   ALKPHOS 54 04/17/2020 0949   ALKPHOS 65 09/19/2013 0431   BILITOT 0.6 04/17/2020 0949   BILITOT 0.4 03/10/2018 1507   BILITOT 0.4 09/19/2013 0431   GFRNONAA 55 (L) 04/20/2020 0303   GFRNONAA >60 09/24/2013 0536   GFRAA >60 02/23/2020 0903   GFRAA >60 09/24/2013 0536   COAGS Lab Results  Component Value Date   INR 1.1 04/17/2020   INR 1.1 09/27/2018   INR 1.22 05/20/2016   Lipid Panel    Component Value Date/Time   CHOL 198 04/18/2020 0950   CHOL 262 (H) 07/04/2019 1330   CHOL 183 09/20/2013 0424   TRIG 92 04/18/2020 0950   TRIG 105 09/20/2013 0424   HDL 50 04/18/2020 0950   HDL 76 07/04/2019 1330   HDL 40 09/20/2013 0424   CHOLHDL 4.0 04/18/2020 0950   VLDL 18 04/18/2020 0950   VLDL 21 09/20/2013 0424   LDLCALC 130 (H) 04/18/2020 0950   LDLCALC 171 (H) 07/04/2019 1330   LDLCALC 122 (H) 09/20/2013 0424   HgbA1C  Lab Results  Component Value Date   HGBA1C 5.5 04/18/2020   Urinalysis    Component Value Date/Time   COLORURINE Yellow 09/19/2013 1635   APPEARANCEUR Turbid 09/19/2013 1635   LABSPEC 1.027 09/19/2013 1635   PHURINE 5.0 09/19/2013 1635   GLUCOSEU Negative 09/19/2013 1635   HGBUR 1+ 09/19/2013 1635   BILIRUBINUR Negative 09/19/2013 1635   KETONESUR Negative 09/19/2013 1635   PROTEINUR 30 mg/dL 09/19/2013 1635   NITRITE Negative 09/19/2013 1635   LEUKOCYTESUR Negative  09/19/2013 1635   Urine Drug Screen     Component Value Date/Time   LABOPIA NONE DETECTED 04/18/2020 1611   COCAINSCRNUR NONE DETECTED 04/18/2020 1611   LABBENZ NONE DETECTED 04/18/2020 1611   AMPHETMU NONE DETECTED 04/18/2020 1611   THCU NONE DETECTED 04/18/2020 1611   LABBARB NONE DETECTED 04/18/2020 1611    Alcohol Level No  results found for: [facility]   SIGNIFICANT DIAGNOSTIC STUDIES CT ANGIO HEAD W OR WO CONTRAST  Result Date: 04/19/2020 CLINICAL DATA:  Mechanical thrombectomy with stent placement 2 days ago. Follow-up EXAM: CT ANGIOGRAPHY HEAD AND NECK TECHNIQUE: Multidetector CT imaging of the head and neck was performed using the standard protocol during bolus administration of intravenous contrast. Multiplanar CT image reconstructions and MIPs were obtained to evaluate the vascular anatomy. Carotid stenosis measurements (when applicable) are obtained utilizing NASCET criteria, using the distal internal carotid diameter as the denominator. CONTRAST:  157mL OMNIPAQUE IOHEXOL 350 MG/ML SOLN COMPARISON:  Head CT from yesterday.  CTA head neck from 2 days ago FINDINGS: CT HEAD FINDINGS Brain: Better seen the likely non progressed left stratum infarct. No visible cortical infarct or hemorrhagic complication. No hydrocephalus Vascular: See below Skull: Negative Sinuses: Retention cyst appearance in the left maxillary sinus. Orbits: No acute finding Review of the MIP images confirms the above findings CTA NECK FINDINGS Aortic arch: No acute finding.  Two vessel branching. Right carotid system: Vessels are smooth and widely patent Left carotid system: Vessels are smooth and widely patent. Vertebral arteries: No proximal subclavian stenosis. Symmetrically sized vertebral arteries that are smooth and widely patent to the dura. Skeleton: Negative Other neck: No acute finding.  Unilateral right thyroid gland. Upper chest: Enlarged heart, partially covered. Review of the MIP images confirms the above  findings CTA HEAD FINDINGS Anterior circulation: Partially obscured siphons due to venous enhancement. No stenosis or irregularity is seen. Left MCA stenting, spanning M1 to M2 segments. At the mid stent there is nonocclusive luminal soft tissue density from the posterior wall. No downstream diminished flow. No contralateral embolism is seen. There is early branching of the right MCA. Posterior circulation: The vertebral and basilar arteries are diffusely patent. No branch occlusion, beading, or aneurysm. Venous sinuses: Diffusely patent Anatomic variants: None significant Review of the MIP images confirms the above findings IMPRESSION: 1. Left MCA stenting with nonocclusive thrombus seen along the posterior wall of the mid stent. 2. No stenosis or embolic source seen in the neck. Electronically Signed   By: Monte Fantasia M.D.   On: 04/19/2020 07:38   CT Code Stroke CTA Head W/WO contrast  Result Date: 04/17/2020 CLINICAL DATA:  Right-sided weakness. EXAM: CT ANGIOGRAPHY HEAD AND NECK CT PERFUSION BRAIN TECHNIQUE: Multidetector CT imaging of the head and neck was performed using the standard protocol during bolus administration of intravenous contrast. Multiplanar CT image reconstructions and MIPs were obtained to evaluate the vascular anatomy. Carotid stenosis measurements (when applicable) are obtained utilizing NASCET criteria, using the distal internal carotid diameter as the denominator. Multiphase CT imaging of the brain was performed following IV bolus contrast injection. Subsequent parametric perfusion maps were calculated using RAPID software. CONTRAST:  183mL OMNIPAQUE IOHEXOL 350 MG/ML SOLN COMPARISON:  CT head 04/17/2020 FINDINGS: CTA NECK FINDINGS Aortic arch: On the initial scan, there is no arterial enhancement due to early trigger of the machine. Patient was rescanned immediately after the initial scan with satisfactory opacification of the intracranial arterial system. The neck was not  re-scanned to evaluate the carotid and vertebral arteries in the neck. Right carotid system: Not evaluated due to lack of arterial contrast. Left carotid system: Not evaluated due to lack of arterial contrast. Vertebral arteries: Not evaluated. Skeleton: No acute abnormality. Other neck: Question left thyroidectomy. Right thyroid mildly enlarged without focal abnormality. No adenopathy in the neck. Upper chest: Mild hilar adenopathy bilaterally.  Lung apices clear. Review of  the MIP images confirms the above findings CTA HEAD FINDINGS Anterior circulation: Internal carotid artery widely patent through the cavernous segment bilaterally. Occlusion left M1 segment proximally. There is collateral circulation with good opacification of left M2 and M3 branches. Both anterior cerebral arteries are patent without stenosis. Right middle cerebral artery widely patent without stenosis. Posterior circulation: Both vertebral arteries patent to the basilar. PICA patent bilaterally. Basilar widely patent. Superior cerebellar and posterior cerebral arteries patent bilaterally without stenosis or large vessel occlusion. Venous sinuses: Normal venous enhancement. Anatomic variants: None Review of the MIP images confirms the above findings CT Brain Perfusion Findings: ASPECTS: 9 CBF (<30%) Volume: 0ML Perfusion (Tmax>6.0s) volume: 46mL Mismatch Volume: 10mL Infarction Location:Left MCA territory involving the frontal and parietal lobe. IMPRESSION: 1. CT perfusion positive for 64 mL of delayed perfusion in the left MCA territory. No fixed infarct on perfusion. 2. Occlusion proximal left M1 segment with good distal collateral flow to the left MCA branches. 3. No other intracranial stenosis 4. Carotid vertebral arteries in the neck were not evaluated due to lack of arterial opacification. The scan was performed early in the pulmonary arterial phase and subsequently immediately repeated with adequate intracranial arterial enhancement. 5.  Images were reviewed with Dr. Leonel Ramsay following completion of the study Electronically Signed   By: Franchot Gallo M.D.   On: 04/17/2020 10:28   CT HEAD WO CONTRAST  Result Date: 04/18/2020 CLINICAL DATA:  Stroke, follow-up. Intracranial stent placed yesterday. EXAM: CT HEAD WITHOUT CONTRAST TECHNIQUE: Contiguous axial images were obtained from the base of the skull through the vertex without intravenous contrast. COMPARISON:  Noncontrast head CT performed 04/17/2020 at 6:18 p.m. Noncontrast head CT, CT angiogram head/neck and CT perfusion performed earlier the same day 04/17/2020. FINDINGS: Brain: Previously demonstrated vague hyperdensity within the left basal ganglia, left insula and left frontoparietal cortex is no longer present. Abnormal hypodensity is again demonstrated within the posterior aspect of the left putamen suspicious for acute/early subacute infarction (series 3, image 16). No other definite loss of gray-white differentiation is identified. No evidence of acute intracranial hemorrhage. No extra-axial fluid collection. No evidence of intracranial mass. No midline shift. Vascular: Redemonstrated vascular stent in the region of the M1/M2 left middle cerebral artery. No hyperdense vessel. Skull: Normal. Negative for fracture or focal lesion. Sinuses/Orbits: Visualized orbits show no acute finding. Moderate-sized left maxillary sinus mucous retention cyst. No significant mastoid effusion. IMPRESSION: Previously demonstrated vague hyperdensity within the left basal ganglia, left insula and left frontoparietal cortex is no longer present and likely reflected contrast staining. Redemonstrated abnormal hypodensity within the posterior aspect of the left putamen suspicious for acute/early subacute infarction. No other definite loss of gray-white differentiation is identified. No acute intracranial hemorrhage. Redemonstrated vascular stent in the region of the M1/M2 left MCA. Electronically Signed    By: Kellie Simmering DO   On: 04/18/2020 10:02   CT HEAD WO CONTRAST  Result Date: 04/17/2020 CLINICAL DATA:  Stroke, follow-up; follow-up. CT post Cangrelor infusion. EXAM: CT HEAD WITHOUT CONTRAST TECHNIQUE: Contiguous axial images were obtained from the base of the skull through the vertex without intravenous contrast. COMPARISON:  Noncontrast head CT, CT angiogram head/neck and CT perfusion performed earlier the same day 04/17/2020. FINDINGS: Brain: There is subtle hyperdensity within the left basal ganglia as well as within the left insular cortex and left frontoparietal cortex along the left sylvian fissure, suspected to reflect contrast staining at site of acute infarction. Gray-white differentiation is otherwise maintained. No frank parenchymal hemorrhage.  No evidence of intracranial mass. No significant mass effect.  No midline shift. Vascular: A vascular stent is now present in the region of the M1/M2 left middle cerebral artery. Skull: Normal. Negative for fracture or focal lesion. Sinuses/Orbits: Visualized orbits show no acute finding. Mucous retention cyst within the left maxillary sinus. Trace scattered paranasal sinus mucosal thickening elsewhere. No significant mastoid effusion. Other: Partially visualized support tubes. IMPRESSION: Subtle hyperdensity is present within the left basal ganglia, as well as within the left insular cortex and left frontal parietal cortex along the left sylvian fissure. Findings are suspected to reflect contrast staining at site of acute infarction. Trace adjacent contrast extravasation/subarachnoid hemorrhage is difficult to exclude. Gray-white differentiation is otherwise maintained. No frank parenchymal hemorrhage. A vascular stent is now present in the region of the M1/M2 left middle cerebral artery. Electronically Signed   By: Kellie Simmering DO   On: 04/17/2020 18:51   CT ANGIO NECK W OR WO CONTRAST  Result Date: 04/19/2020 CLINICAL DATA:  Mechanical  thrombectomy with stent placement 2 days ago. Follow-up EXAM: CT ANGIOGRAPHY HEAD AND NECK TECHNIQUE: Multidetector CT imaging of the head and neck was performed using the standard protocol during bolus administration of intravenous contrast. Multiplanar CT image reconstructions and MIPs were obtained to evaluate the vascular anatomy. Carotid stenosis measurements (when applicable) are obtained utilizing NASCET criteria, using the distal internal carotid diameter as the denominator. CONTRAST:  166mL OMNIPAQUE IOHEXOL 350 MG/ML SOLN COMPARISON:  Head CT from yesterday.  CTA head neck from 2 days ago FINDINGS: CT HEAD FINDINGS Brain: Better seen the likely non progressed left stratum infarct. No visible cortical infarct or hemorrhagic complication. No hydrocephalus Vascular: See below Skull: Negative Sinuses: Retention cyst appearance in the left maxillary sinus. Orbits: No acute finding Review of the MIP images confirms the above findings CTA NECK FINDINGS Aortic arch: No acute finding.  Two vessel branching. Right carotid system: Vessels are smooth and widely patent Left carotid system: Vessels are smooth and widely patent. Vertebral arteries: No proximal subclavian stenosis. Symmetrically sized vertebral arteries that are smooth and widely patent to the dura. Skeleton: Negative Other neck: No acute finding.  Unilateral right thyroid gland. Upper chest: Enlarged heart, partially covered. Review of the MIP images confirms the above findings CTA HEAD FINDINGS Anterior circulation: Partially obscured siphons due to venous enhancement. No stenosis or irregularity is seen. Left MCA stenting, spanning M1 to M2 segments. At the mid stent there is nonocclusive luminal soft tissue density from the posterior wall. No downstream diminished flow. No contralateral embolism is seen. There is early branching of the right MCA. Posterior circulation: The vertebral and basilar arteries are diffusely patent. No branch occlusion,  beading, or aneurysm. Venous sinuses: Diffusely patent Anatomic variants: None significant Review of the MIP images confirms the above findings IMPRESSION: 1. Left MCA stenting with nonocclusive thrombus seen along the posterior wall of the mid stent. 2. No stenosis or embolic source seen in the neck. Electronically Signed   By: Monte Fantasia M.D.   On: 04/19/2020 07:38   CT Code Stroke CTA Neck W/WO contrast  Result Date: 04/17/2020 CLINICAL DATA:  Right-sided weakness. EXAM: CT ANGIOGRAPHY HEAD AND NECK CT PERFUSION BRAIN TECHNIQUE: Multidetector CT imaging of the head and neck was performed using the standard protocol during bolus administration of intravenous contrast. Multiplanar CT image reconstructions and MIPs were obtained to evaluate the vascular anatomy. Carotid stenosis measurements (when applicable) are obtained utilizing NASCET criteria, using the distal internal carotid diameter  as the denominator. Multiphase CT imaging of the brain was performed following IV bolus contrast injection. Subsequent parametric perfusion maps were calculated using RAPID software. CONTRAST:  125mL OMNIPAQUE IOHEXOL 350 MG/ML SOLN COMPARISON:  CT head 04/17/2020 FINDINGS: CTA NECK FINDINGS Aortic arch: On the initial scan, there is no arterial enhancement due to early trigger of the machine. Patient was rescanned immediately after the initial scan with satisfactory opacification of the intracranial arterial system. The neck was not re-scanned to evaluate the carotid and vertebral arteries in the neck. Right carotid system: Not evaluated due to lack of arterial contrast. Left carotid system: Not evaluated due to lack of arterial contrast. Vertebral arteries: Not evaluated. Skeleton: No acute abnormality. Other neck: Question left thyroidectomy. Right thyroid mildly enlarged without focal abnormality. No adenopathy in the neck. Upper chest: Mild hilar adenopathy bilaterally.  Lung apices clear. Review of the MIP images  confirms the above findings CTA HEAD FINDINGS Anterior circulation: Internal carotid artery widely patent through the cavernous segment bilaterally. Occlusion left M1 segment proximally. There is collateral circulation with good opacification of left M2 and M3 branches. Both anterior cerebral arteries are patent without stenosis. Right middle cerebral artery widely patent without stenosis. Posterior circulation: Both vertebral arteries patent to the basilar. PICA patent bilaterally. Basilar widely patent. Superior cerebellar and posterior cerebral arteries patent bilaterally without stenosis or large vessel occlusion. Venous sinuses: Normal venous enhancement. Anatomic variants: None Review of the MIP images confirms the above findings CT Brain Perfusion Findings: ASPECTS: 9 CBF (<30%) Volume: 0ML Perfusion (Tmax>6.0s) volume: 73mL Mismatch Volume: 2mL Infarction Location:Left MCA territory involving the frontal and parietal lobe. IMPRESSION: 1. CT perfusion positive for 64 mL of delayed perfusion in the left MCA territory. No fixed infarct on perfusion. 2. Occlusion proximal left M1 segment with good distal collateral flow to the left MCA branches. 3. No other intracranial stenosis 4. Carotid vertebral arteries in the neck were not evaluated due to lack of arterial opacification. The scan was performed early in the pulmonary arterial phase and subsequently immediately repeated with adequate intracranial arterial enhancement. 5. Images were reviewed with Dr. Leonel Ramsay following completion of the study Electronically Signed   By: Franchot Gallo M.D.   On: 04/17/2020 10:28   IR Intra Cran Stent  Result Date: 04/19/2020 INDICATION: New onset right-sided weakness, with slurred speech. 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 EXAM: 1. EMERGENT LARGE VESSEL OCCLUSION THROMBOLYSIS anterior CIRCULATION) COMPARISON:  CT angiogram of the head and neck of April 17, 2020. MEDICATIONS:  Ancef 2 g IV antibiotic was administered within 1 hour of the procedure. ANESTHESIA/SEDATION: General anesthesia CONTRAST:  Isovue 300 approximately 200 mL FLUOROSCOPY TIME:  Fluoroscopy Time: 105 minutes 18 seconds (4849 mGy). COMPLICATIONS: None immediate. TECHNIQUE: Following a full explanation of the procedure along with the potential associated complications, an informed witnessed consent was obtained. The risks of intracranial hemorrhage of 10%, worsening neurological deficit, ventilator dependency, death and inability to revascularize were all reviewed in detail with the patient's spouse The patient was then put under general anesthesia by the Department of Anesthesiology at [facility]. The right groin was prepped and draped in the usual sterile fashion. Thereafter using modified Seldinger technique, transfemoral access into the right common femoral artery was obtained without difficulty. Over a 0.035 inch guidewire an 8 French 25 cm Pinnacle sheath was inserted. Through this, and also over a 0.035 inch guidewire a 5 [place] JB 1 catheter was advanced to the  aortic arch region and selectively positioned right common carotid artery. FINDINGS: The right common carotid arteriogram demonstrates the right external carotid artery and its major branches to be widely patent. The right internal carotid artery at the bulb to the cranial skull base is widely patent. The petrous, the cavernous and the supraclinoid segments are widely patent. The left middle cerebral artery demonstrates complete angiographic occlusion at its origin. The left anterior cerebral artery opacifies into the capillary and venous phases. Also noted is a left posterior communicating artery opacifying the left posterior cerebral artery distribution. PROCEDURE: Balloon guide was then advanced to the distal left cervical ICA over a 0.035 inch Roadrunner guidewire. Through this, a combination of an 021 150 cm Trevo ProVue microcatheter inside  an 071 Zoom aspiration catheter was advanced over a 0.014 inch standard Synchro micro guidewire with a J configuration to the supraclinoid left ICA. Micro guidewire was then gently manipulated and advanced without difficulty to the M2 region of the inferior division followed by the microcatheter. The guidewire was removed. Good aspiration obtained from the hub of the microcatheter. A gentle contrast injection demonstrated safe position of the tip of the microcatheter. A Tiger 21 retrieval device was then advanced to the distal end of the microcatheter. This was then deployed in the usual manner by retracting the microcatheter. The proximal portion of the Tiger retrieval device was in the proximal occluded segment of the left middle cerebral artery. The aspiration catheter was then advanced to engage the proximal occlusion of the left middle cerebral artery. After approximately 2 minutes of aspiration and during which time the retrieval device was expanded and decreased in diameter, the combination of the retrieval device, the aspiration catheter, and the microcatheter were retrieved and removed. Proximal flow arrest was reversed. A control arteriogram performed through the balloon guide catheter in the left internal carotid artery demonstrates revascularization of the left middle cerebral M1 segment of the anterior temporal branch, and also the superior division achieving a TICI 2b revascularization. This prompted another pass using the combination of the 071 aspiration catheter with the 021 microcatheter advanced again into the occluded inferior division in the M2 M3 region. Again the micro guidewire was removed. Good aspiration obtained from the hub of the microcatheter. At this time, a 5 mm x 37 mm Embotrap retrieval device was then advanced to the distal end of the microcatheter. This was then deployed in the usual manner with the proximal portion of the device in the proximal portion of the occlusion. The Zoom  aspiration catheter was again engaged in the proximal portion of the clot. Continuous aspiration was then performed for approximately 2 minutes at the hub of the aspiration catheter, and also at the hub of the balloon guide with right proximal flow arrest with aspiration with a 60 mL syringe. Thereafter, the combination of the retrieval device, the microcatheter, and the aspiration catheter were retrieved and removed. Aspiration was reversed. 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 The next pass was made with a Solitaire X 4 x 40 mm, using the above combination as described above. Again there continued to be occlusion of the inferior division. The next stent was with a Zoom 35 aspiration catheter which was advanced inside of a 35 aspiration Zoom catheter and engaged at the proximal portion of the occluded inferior division. Aspiration was continued for approximately 2 minutes. Thereafter, the combination of  the aspiration 35 catheter, and the 55 catheter were retrieved and removed. A control arteriogram performed continued to demonstrate occluded inferior division. The next pass was made with a 6.5 mm x 40 mm Embotrap retrieval device again engaged in the occluded inferior division. Following flow arrest, and aspiration through the 35 catheter and also the 55 aspiration catheter for approximately 2 minutes with proximal flow arrest and a 60 mL syringe at the hub of the balloon guide catheter, the combination was retrieved and removed. Arteriogram performed following reversal of flow arrest in the left internal carotid artery again demonstrated no change in the occluded inferior division. The next pass was made with a 6 mm x 40 mm Solitaire X retrieval device again with proximal flow arrest, and constant aspiration with a 60 mL syringe at the hub of the balloon guide catheter  in the left internal carotid artery, and the 55 aspiration catheter engaged in the proximal portion of the clot in the inferior division for approximately 2 minutes. 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 It was, therefore, elected to proceed with placement of a stent to open up the prominent inferior division. Prior to this, the patient was loaded with aspirin 81 mg, and Brilinta 180 mg via an orogastric tube. CT of the brain was performed which showed no evidence of intracranial hemorrhage. Through the balloon guide in left internal carotid artery, a combination of a Catalyst 115 cm guide catheter and an 021 Trevo ProVue microcatheter combination was advanced over a 0.014 inch Aristotle micro guidewire to the supraclinoid left ICA. The micro guidewire was then manipulated with a torque device and advanced through the occluded left MCA inferior division into the M2 M3 region followed by the microcatheter. The guidewire was removed. Good aspiration obtained from the hub of the microcatheter which was then connected to continuous heparinized saline infusion. Simultaneous arteriograms were performed through the microcatheter and also through the Catalyst guide catheter in the supraclinoid left ICA in order to engage the occluded segment of the inferior division. It was decided to place a 4.5 mm x 30 mm Atlas Neuroform stent. This was then advanced to the distal end of the microcatheter. The O ring on the delivery microcatheter was loosened. The stent was then deployed by slow steady retrieval of the 021 microcatheter. Following the delivery, a control arteriogram performed through the 5 [place] Catalyst guide catheter in the left internal carotid artery demonstrated excellent apposition and coverage at the proximal, and  the distal end of the stent. Also noted now was a TICI 2c revascularization of the left middle cerebral artery. Prior to the placement of the stent, the patient was given a bolus dose of Cangrelor IV followed by a slow infusion for 4 hours. Control arteriogram performed at 10 and 20 minutes post placement of the stent continued to demonstrate wide patency of the stented segment of the inferior division of the left middle cerebral artery. The Catalyst guide catheter was retrieved and removed. Control arteriogram performed through the balloon guide now in the left common carotid artery demonstrated patency of the left internal carotid artery proximally and distally intracranially. There continued to be patency of the left middle cerebral artery with a TICI 2c revascularization. The left anterior cerebral artery continued to be widely patent as well. Also noted was free flow into the  left posterior communicating artery and the left posterior cerebral artery distribution. Throughout the procedure, the patient's blood pressure and neurological status remain stable. No evidence of extravasation or dissection was seen. The 8 French balloon guide was removed. An 8 French Angio-Seal closure device was then placed for hemostasis in the right common femoral puncture site. Distal pulses continued to be Dopplerable in the feet bilaterally. CT of the brain demonstrated no evidence of intracranial hemorrhage, or mass effect or midline shift. Patient was left intubated on account of her medical condition, and low ejection fraction and transferred to the neuro ICU for post revascularization management. 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 PLAN: Follow-up in the clinic 4 weeks post discharge. Electronically Signed   By: Luanne Bras M.D.   On: 04/18/2020 09:51   IR CT Head [facility]  Result Date: 04/19/2020 INDICATION: New onset right-sided weakness, with slurred speech. 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 EXAM: 1. EMERGENT LARGE VESSEL OCCLUSION THROMBOLYSIS anterior CIRCULATION) COMPARISON:  CT angiogram of the head and neck of April 17, 2020. MEDICATIONS: Ancef 2 g IV antibiotic was administered within 1 hour of the procedure. ANESTHESIA/SEDATION: General anesthesia CONTRAST:  Isovue 300 approximately 200 mL FLUOROSCOPY TIME:  Fluoroscopy Time: 105 minutes 18 seconds (4849 mGy). COMPLICATIONS: None immediate. TECHNIQUE: Following a full explanation of the procedure along with the potential associated complications, an informed witnessed consent was obtained. The risks of intracranial hemorrhage of 10%, worsening neurological deficit, ventilator dependency, death and inability to revascularize were all reviewed in detail with the patient's spouse The patient was then put under general anesthesia by the Department of Anesthesiology at [facility]. The right groin was prepped and draped in the usual sterile fashion. Thereafter using modified Seldinger technique, transfemoral access into the right common femoral artery was obtained without difficulty. Over a 0.035 inch guidewire an 8 French 25 cm Pinnacle sheath was inserted. Through this, and also over a 0.035 inch guidewire a 5 [place] JB 1 catheter was advanced to the aortic arch region and selectively positioned right common carotid artery. FINDINGS: The right common carotid arteriogram demonstrates the right external carotid artery and its major branches to be widely patent. The right internal carotid  artery at the bulb to the cranial skull base is widely patent. The petrous, the cavernous and the supraclinoid segments are widely patent. The left middle cerebral artery demonstrates complete angiographic occlusion at its origin. The left anterior cerebral artery opacifies into the capillary and venous phases. Also noted is a left posterior communicating artery opacifying the left posterior cerebral artery distribution. PROCEDURE: Balloon guide was then advanced to the distal left cervical ICA over a 0.035 inch Roadrunner guidewire. Through this, a combination of an 021 150 cm Trevo ProVue microcatheter inside an 071 Zoom aspiration catheter was advanced over a 0.014 inch standard Synchro micro guidewire with a J configuration to the supraclinoid left ICA. Micro guidewire was then gently manipulated and advanced without difficulty to the M2 region of the inferior division followed by the  microcatheter. The guidewire was removed. Good aspiration obtained from the hub of the microcatheter. A gentle contrast injection demonstrated safe position of the tip of the microcatheter. A Tiger 21 retrieval device was then advanced to the distal end of the microcatheter. This was then deployed in the usual manner by retracting the microcatheter. The proximal portion of the Tiger retrieval device was in the proximal occluded segment of the left middle cerebral artery. The aspiration catheter was then advanced to engage the proximal occlusion of the left middle cerebral artery. After approximately 2 minutes of aspiration and during which time the retrieval device was expanded and decreased in diameter, the combination of the retrieval device, the aspiration catheter, and the microcatheter were retrieved and removed. Proximal flow arrest was reversed. A control arteriogram performed through the balloon guide catheter in the left internal carotid artery demonstrates revascularization of the left middle cerebral M1 segment of the  anterior temporal branch, and also the superior division achieving a TICI 2b revascularization. This prompted another pass using the combination of the 071 aspiration catheter with the 021 microcatheter advanced again into the occluded inferior division in the M2 M3 region. Again the micro guidewire was removed. Good aspiration obtained from the hub of the microcatheter. At this time, a 5 mm x 37 mm Embotrap retrieval device was then advanced to the distal end of the microcatheter. This was then deployed in the usual manner with the proximal portion of the device in the proximal portion of the occlusion. The Zoom aspiration catheter was again engaged in the proximal portion of the clot. Continuous aspiration was then performed for approximately 2 minutes at the hub of the aspiration catheter, and also at the hub of the balloon guide with right proximal flow arrest with aspiration with a 60 mL syringe. Thereafter, the combination of the retrieval device, the microcatheter, and the aspiration catheter were retrieved and removed. Aspiration was reversed. 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 The next pass was made with a Solitaire X 4 x 40 mm, using the above combination as described above. Again there continued to be occlusion of the inferior division. The next stent was with a Zoom 35 aspiration catheter which was advanced inside of a 35 aspiration Zoom catheter and engaged at the proximal portion of the occluded inferior division. Aspiration was continued for approximately 2 minutes. Thereafter, the combination of the aspiration 35 catheter, and the 55 catheter were retrieved and removed. A control arteriogram performed continued to demonstrate occluded inferior division. The next pass was made with a 6.5 mm x 40 mm Embotrap retrieval device again engaged in the occluded  inferior division. Following flow arrest, and aspiration through the 35 catheter and also the 55 aspiration catheter for approximately 2 minutes with proximal flow arrest and a 60 mL syringe at the hub of the balloon guide catheter, the combination was retrieved and removed. Arteriogram performed following reversal of flow arrest in the left internal carotid artery again demonstrated no change in the occluded inferior division. The next pass was made with a 6 mm x 40 mm Solitaire X retrieval device again with proximal flow arrest, and constant aspiration with a 60 mL syringe at the hub of the balloon guide catheter in the left internal carotid artery, and the 55 aspiration catheter engaged in the proximal portion of the clot in the inferior division for approximately 2 minutes.  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 It was, therefore, elected to proceed with placement of a stent to open up the prominent inferior division. Prior to this, the patient was loaded with aspirin 81 mg, and Brilinta 180 mg via an orogastric tube. CT of the brain was performed which showed no evidence of intracranial hemorrhage. Through the balloon guide in left internal carotid artery, a combination of a Catalyst 115 cm guide catheter and an 021 Trevo ProVue microcatheter combination was advanced over a 0.014 inch Aristotle micro guidewire to the supraclinoid left ICA. The micro guidewire was then manipulated with a torque device and advanced through the occluded left MCA inferior division into the M2 M3 region followed by the microcatheter. The guidewire was removed. Good aspiration obtained from the hub of the microcatheter which was then connected to continuous heparinized saline infusion. Simultaneous arteriograms were performed through  the microcatheter and also through the Catalyst guide catheter in the supraclinoid left ICA in order to engage the occluded segment of the inferior division. It was decided to place a 4.5 mm x 30 mm Atlas Neuroform stent. This was then advanced to the distal end of the microcatheter. The O ring on the delivery microcatheter was loosened. The stent was then deployed by slow steady retrieval of the 021 microcatheter. Following the delivery, a control arteriogram performed through the 5 [place] Catalyst guide catheter in the left internal carotid artery demonstrated excellent apposition and coverage at the proximal, and the distal end of the stent. Also noted now was a TICI 2c revascularization of the left middle cerebral artery. Prior to the placement of the stent, the patient was given a bolus dose of Cangrelor IV followed by a slow infusion for 4 hours. Control arteriogram performed at 10 and 20 minutes post placement of the stent continued to demonstrate wide patency of the stented segment of the inferior division of the left middle cerebral artery. The Catalyst guide catheter was retrieved and removed. Control arteriogram performed through the balloon guide now in the left common carotid artery demonstrated patency of the left internal carotid artery proximally and distally intracranially. There continued to be patency of the left middle cerebral artery with a TICI 2c revascularization. The left anterior cerebral artery continued to be widely patent as well. Also noted was free flow into the left posterior communicating artery and the left posterior cerebral artery distribution. Throughout the procedure, the patient's blood pressure and neurological status remain stable. No evidence of extravasation or dissection was seen. The 8 French balloon guide was removed. An 8 French Angio-Seal closure device was then placed for hemostasis in the right common femoral puncture site. Distal pulses continued to be Dopplerable  in the feet bilaterally. CT of the brain demonstrated no evidence of intracranial hemorrhage, or mass effect or midline shift. Patient was left intubated on account of her medical condition, and low ejection fraction and transferred to the neuro ICU for post revascularization management. 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 PLAN: Follow-up in the clinic 4 weeks post discharge. Electronically Signed   By: Luanne Bras M.D.   On: 04/18/2020 09:51   IR CT Head Ltd  Result Date: 04/19/2020 INDICATION: New onset right-sided weakness, with slurred speech. 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 EXAM: 1. EMERGENT LARGE VESSEL OCCLUSION THROMBOLYSIS anterior CIRCULATION) COMPARISON:  CT angiogram of the head and neck of April 17, 2020. MEDICATIONS: Ancef 2 g IV antibiotic was administered within 1 hour of the procedure. ANESTHESIA/SEDATION: General anesthesia CONTRAST:  Isovue 300 approximately 200 mL FLUOROSCOPY TIME:  Fluoroscopy Time: 105 minutes 18 seconds (4849 mGy). COMPLICATIONS: None immediate. TECHNIQUE: Following a full explanation of the procedure along with the potential associated complications, an informed witnessed consent was obtained. The risks of intracranial hemorrhage of 10%, worsening neurological deficit, ventilator dependency, death and inability to revascularize were all reviewed in detail with the patient's spouse The patient was then put under general anesthesia by the Department of Anesthesiology at [facility]. The right groin was prepped and draped in the usual sterile fashion. Thereafter using modified Seldinger technique, transfemoral access into the right common femoral artery was obtained without difficulty. Over a 0.035 inch guidewire an 8 French 25 cm Pinnacle sheath was inserted. Through this, and also over a 0.035 inch guidewire a 5 [place] JB 1 catheter was advanced to the aortic arch region and selectively positioned right common carotid artery. FINDINGS: The right common carotid arteriogram demonstrates the right external carotid artery and its major branches to be widely patent. The right internal carotid artery at the bulb to the cranial skull base is widely patent. The petrous, the cavernous and the supraclinoid segments are widely patent. The left middle cerebral artery demonstrates complete angiographic occlusion at its origin. The left anterior cerebral artery opacifies into the capillary and venous phases. Also noted is a left posterior communicating artery opacifying the left posterior cerebral artery distribution. PROCEDURE: Balloon guide was then advanced to the distal left cervical ICA over a 0.035 inch Roadrunner guidewire. Through this, a combination of an 021 150 cm Trevo ProVue microcatheter inside an 071 Zoom aspiration catheter was advanced over a 0.014 inch standard Synchro micro guidewire with a J configuration to the supraclinoid left ICA. Micro guidewire was then gently manipulated and advanced without difficulty to the M2 region of the inferior division followed by the microcatheter. The guidewire was removed. Good aspiration obtained from the hub of the microcatheter. A gentle contrast injection demonstrated safe position of the tip of the microcatheter. A Tiger 21 retrieval device was then advanced to the distal end of the microcatheter. This was then deployed in the usual manner by retracting the microcatheter. The proximal portion of the Tiger retrieval device was in the  proximal occluded segment of the left middle cerebral artery. The aspiration catheter was then advanced to engage the proximal occlusion of the left middle cerebral artery. After approximately 2 minutes of aspiration and during which time the retrieval device was expanded and decreased in diameter, the combination of the retrieval device, the aspiration catheter, and the microcatheter were retrieved and removed. Proximal flow arrest was reversed. A control arteriogram performed through the balloon guide catheter in the left internal carotid artery demonstrates revascularization of the left middle cerebral M1 segment of the anterior temporal branch, and also the superior division achieving a TICI 2b revascularization. This prompted  another pass using the combination of the 071 aspiration catheter with the 021 microcatheter advanced again into the occluded inferior division in the M2 M3 region. Again the micro guidewire was removed. Good aspiration obtained from the hub of the microcatheter. At this time, a 5 mm x 37 mm Embotrap retrieval device was then advanced to the distal end of the microcatheter. This was then deployed in the usual manner with the proximal portion of the device in the proximal portion of the occlusion. The Zoom aspiration catheter was again engaged in the proximal portion of the clot. Continuous aspiration was then performed for approximately 2 minutes at the hub of the aspiration catheter, and also at the hub of the balloon guide with right proximal flow arrest with aspiration with a 60 mL syringe. Thereafter, the combination of the retrieval device, the microcatheter, and the aspiration catheter were retrieved and removed. Aspiration was reversed. 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 The next pass was made with a Solitaire X 4 x 40 mm, using the above  combination as described above. Again there continued to be occlusion of the inferior division. The next stent was with a Zoom 35 aspiration catheter which was advanced inside of a 35 aspiration Zoom catheter and engaged at the proximal portion of the occluded inferior division. Aspiration was continued for approximately 2 minutes. Thereafter, the combination of the aspiration 35 catheter, and the 55 catheter were retrieved and removed. A control arteriogram performed continued to demonstrate occluded inferior division. The next pass was made with a 6.5 mm x 40 mm Embotrap retrieval device again engaged in the occluded inferior division. Following flow arrest, and aspiration through the 35 catheter and also the 55 aspiration catheter for approximately 2 minutes with proximal flow arrest and a 60 mL syringe at the hub of the balloon guide catheter, the combination was retrieved and removed. Arteriogram performed following reversal of flow arrest in the left internal carotid artery again demonstrated no change in the occluded inferior division. The next pass was made with a 6 mm x 40 mm Solitaire X retrieval device again with proximal flow arrest, and constant aspiration with a 60 mL syringe at the hub of the balloon guide catheter in the left internal carotid artery, and the 55 aspiration catheter engaged in the proximal portion of the clot in the inferior division for approximately 2 minutes. 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 It was, therefore, elected to proceed with placement of a stent to open up the prominent inferior division. Prior to this, the patient was loaded with aspirin 81 mg, and Brilinta 180 mg via an orogastric tube. CT of the brain was performed which showed no evidence of intracranial  hemorrhage. Through the balloon guide in left internal carotid artery, a combination of a Catalyst 115 cm guide catheter and an 021 Trevo ProVue microcatheter combination was advanced over a 0.014 inch Aristotle micro guidewire to the supraclinoid left ICA. The micro guidewire was then manipulated with a torque device and advanced through the occluded left MCA inferior division into the M2 M3 region followed by the microcatheter. The guidewire was removed. Good aspiration obtained from the hub of the microcatheter which was then connected to continuous heparinized  saline infusion. Simultaneous arteriograms were performed through the microcatheter and also through the Catalyst guide catheter in the supraclinoid left ICA in order to engage the occluded segment of the inferior division. It was decided to place a 4.5 mm x 30 mm Atlas Neuroform stent. This was then advanced to the distal end of the microcatheter. The O ring on the delivery microcatheter was loosened. The stent was then deployed by slow steady retrieval of the 021 microcatheter. Following the delivery, a control arteriogram performed through the 5 [place] Catalyst guide catheter in the left internal carotid artery demonstrated excellent apposition and coverage at the proximal, and the distal end of the stent. Also noted now was a TICI 2c revascularization of the left middle cerebral artery. Prior to the placement of the stent, the patient was given a bolus dose of Cangrelor IV followed by a slow infusion for 4 hours. Control arteriogram performed at 10 and 20 minutes post placement of the stent continued to demonstrate wide patency of the stented segment of the inferior division of the left middle cerebral artery. The Catalyst guide catheter was retrieved and removed. Control arteriogram performed through the balloon guide now in the left common carotid artery demonstrated patency of the left internal carotid artery proximally and distally intracranially.  There continued to be patency of the left middle cerebral artery with a TICI 2c revascularization. The left anterior cerebral artery continued to be widely patent as well. Also noted was free flow into the left posterior communicating artery and the left posterior cerebral artery distribution. Throughout the procedure, the patient's blood pressure and neurological status remain stable. No evidence of extravasation or dissection was seen. The 8 French balloon guide was removed. An 8 French Angio-Seal closure device was then placed for hemostasis in the right common femoral puncture site. Distal pulses continued to be Dopplerable in the feet bilaterally. CT of the brain demonstrated no evidence of intracranial hemorrhage, or mass effect or midline shift. Patient was left intubated on account of her medical condition, and low ejection fraction and transferred to the neuro ICU for post revascularization management. 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 PLAN: Follow-up in the clinic 4 weeks post discharge. Electronically Signed   By: Luanne Bras M.D.   On: 04/18/2020 09:51   CT Code Stroke Cerebral Perfusion with contrast  Result Date: 04/17/2020 CLINICAL DATA:  Right-sided weakness. EXAM: CT ANGIOGRAPHY HEAD AND NECK CT PERFUSION BRAIN TECHNIQUE: Multidetector CT imaging of the head and neck was performed using the standard protocol during bolus administration of intravenous contrast. Multiplanar CT image reconstructions and MIPs were obtained to evaluate the vascular  anatomy. Carotid stenosis measurements (when applicable) are obtained utilizing NASCET criteria, using the distal internal carotid diameter as the denominator. Multiphase CT imaging of the brain was performed following IV bolus contrast injection. Subsequent parametric perfusion maps were calculated using RAPID software. CONTRAST:  170mL OMNIPAQUE IOHEXOL 350 MG/ML SOLN COMPARISON:  CT head 04/17/2020 FINDINGS: CTA NECK FINDINGS Aortic arch: On the initial scan, there is no arterial enhancement due to early trigger  of the machine. Patient was rescanned immediately after the initial scan with satisfactory opacification of the intracranial arterial system. The neck was not re-scanned to evaluate the carotid and vertebral arteries in the neck. Right carotid system: Not evaluated due to lack of arterial contrast. Left carotid system: Not evaluated due to lack of arterial contrast. Vertebral arteries: Not evaluated. Skeleton: No acute abnormality. Other neck: Question left thyroidectomy. Right thyroid mildly enlarged without focal abnormality. No adenopathy in the neck. Upper chest: Mild hilar adenopathy bilaterally.  Lung apices clear. Review of the MIP images confirms the above findings CTA HEAD FINDINGS Anterior circulation: Internal carotid artery widely patent through the cavernous segment bilaterally. Occlusion left M1 segment proximally. There is collateral circulation with good opacification of left M2 and M3 branches. Both anterior cerebral arteries are patent without stenosis. Right middle cerebral artery widely patent without stenosis. Posterior circulation: Both vertebral arteries patent to the basilar. PICA patent bilaterally. Basilar widely patent. Superior cerebellar and posterior cerebral arteries patent bilaterally without stenosis or large vessel occlusion. Venous sinuses: Normal venous enhancement. Anatomic variants: None Review of the MIP images confirms the above findings CT Brain Perfusion Findings:  ASPECTS: 9 CBF (<30%) Volume: 0ML Perfusion (Tmax>6.0s) volume: 28mL Mismatch Volume: 33mL Infarction Location:Left MCA territory involving the frontal and parietal lobe. IMPRESSION: 1. CT perfusion positive for 64 mL of delayed perfusion in the left MCA territory. No fixed infarct on perfusion. 2. Occlusion proximal left M1 segment with good distal collateral flow to the left MCA branches. 3. No other intracranial stenosis 4. Carotid vertebral arteries in the neck were not evaluated due to lack of arterial opacification. The scan was performed early in the pulmonary arterial phase and subsequently immediately repeated with adequate intracranial arterial enhancement. 5. Images were reviewed with Dr. Leonel Ramsay following completion of the study Electronically Signed   By: Franchot Gallo M.D.   On: 04/17/2020 10:28   DG CHEST PORT 1 VIEW  Result Date: 04/19/2020 CLINICAL DATA:  Stroke. EXAM: PORTABLE CHEST 1 VIEW COMPARISON:  April 18, 2020. FINDINGS: Moderate cardiomegaly is again noted. Left-sided pacemaker is unchanged in position. Endotracheal and nasogastric tubes have been removed. No pneumothorax or pleural effusion is noted. Both lungs are clear. The visualized skeletal structures are unremarkable. IMPRESSION: Stable cardiomegaly. No acute cardiopulmonary abnormality seen. Endotracheal and nasogastric tubes have been removed. Electronically Signed   By: Marijo Conception M.D.   On: 04/19/2020 09:28   DG CHEST PORT 1 VIEW  Result Date: 04/18/2020 CLINICAL DATA:  Respiratory failure, ventilatory support EXAM: PORTABLE CHEST 1 VIEW COMPARISON:  04/17/2020 FINDINGS: Endotracheal tube 4 cm above the carina. Left subclavian pacemaker/defibrillator noted. NG tube enters the stomach with the tip not visualized. Stable cardiomegaly with prominent central vascularity. Dense left lower lobe collapse/consolidation, slightly worse. Difficult to exclude developing left effusion. No significant pneumothorax.  IMPRESSION: Increased left lower lobe retrocardiac collapse/consolidation. Stable cardiomegaly and vascular congestion Electronically Signed   By: Jerilynn Mages.  Shick M.D.   On: 04/18/2020 09:41   DG CHEST PORT 1 VIEW  Result Date: 04/17/2020 CLINICAL DATA:  Recent CVA.  Hypoxia. EXAM: PORTABLE CHEST 1 VIEW COMPARISON:  September 27, 2018 FINDINGS: Endotracheal tube tip is 3.8 cm above the carina. Nasogastric tube tip and side port are below the diaphragm. Pacemaker present with lead attached to the right ventricle, stable. There is ill-defined opacity in the left perihilar region. Lungs elsewhere clear. There is stable cardiomegaly with pulmonary vascularity normal. No adenopathy. No bone lesions. IMPRESSION: Tube positions  as described without pneumothorax. Ill-defined opacity left perihilar region consistent with developing pneumonia or possibly aspiration. Lungs elsewhere clear. Stable cardiomegaly. Stable pacemaker lead positioning. Electronically Signed   By: Lowella Grip III M.D.   On: 04/17/2020 15:36   ECHOCARDIOGRAM COMPLETE  Result Date: 04/17/2020    ECHOCARDIOGRAM REPORT   Patient Name:   [name] Date of Exam: 04/17/2020 Medical Rec #:  [id number]    Height:       63.5 in Accession #:    [id number]   Weight:       252.4 lb Date of Birth:  1971/03/01    BSA:          2.147 m Patient Age:    49 years     BP:           133/83 mmHg Patient Gender: F            HR:           66 bpm. Exam Location:  Inpatient Procedure: 2D Echo, Color Doppler and Cardiac Doppler Indications:    Stroke i163.9  History:        Patient has prior history of Echocardiogram examinations, most                 recent 09/27/2019. CHF, Defibrillator, Pulmonary HTN; Risk                 Factors:Hypertension and Dyslipidemia.  Sonographer:    Raquel Sarna Senior RDCS Referring Phys: [place] KIRKPATRICK  Sonographer Comments: Scanned supine on artificial respirator IMPRESSIONS  1. No evidence of left ventricular thrombus. Left  ventricular ejection fraction, by estimation, is 20 to 25%. The left ventricle has severely decreased function. The left ventricle demonstrates global hypokinesis. The left ventricular internal cavity size was severely dilated. Left ventricular diastolic parameters are consistent with Grade III diastolic dysfunction (restrictive).  2. Right ventricular systolic function is mildly reduced. The right ventricular size is normal. There is mildly elevated pulmonary artery systolic pressure. The estimated right ventricular systolic pressure is 40.8 mmHg.  3. Left atrial size was severely dilated.  4. A small pericardial effusion is present. The pericardial effusion is circumferential. There is no evidence of cardiac tamponade.  5. The mitral valve is normal in structure. Mild to moderate mitral valve regurgitation.  6. The aortic valve is normal in structure. Aortic valve regurgitation is not visualized.  7. The inferior vena cava is dilated in size with <50% respiratory variability, suggesting right atrial pressure of 15 mmHg. FINDINGS  Left Ventricle: No evidence of left ventricular thrombus. Left ventricular ejection fraction, by estimation, is 20 to 25%. The left ventricle has severely decreased function. The left ventricle demonstrates global hypokinesis. The left ventricular internal cavity size was severely dilated. There is no left ventricular hypertrophy. Left ventricular diastolic parameters are consistent with Grade III diastolic dysfunction (restrictive). Right Ventricle: The right ventricular size is normal. No increase in right ventricular wall thickness. Right ventricular systolic function is mildly reduced. There is mildly elevated pulmonary artery systolic pressure. The tricuspid regurgitant velocity  is 2.40 m/s, and with an assumed right atrial pressure of 15 mmHg, the estimated right ventricular systolic pressure is 14.4 mmHg. Left Atrium: Left atrial size was severely dilated. Right Atrium: Right  atrial size was normal in size. Pericardium: A small pericardial effusion is present. The pericardial effusion is circumferential. There is no evidence of cardiac tamponade. Mitral Valve: The mitral valve is normal in structure. Mild to  moderate mitral valve regurgitation, with posteriorly-directed jet. Tricuspid Valve: The tricuspid valve is normal in structure. Tricuspid valve regurgitation is trivial. Aortic Valve: The aortic valve is normal in structure. Aortic valve regurgitation is not visualized. Pulmonic Valve: The pulmonic valve was grossly normal. Pulmonic valve regurgitation is not visualized. Aorta: The aortic root and ascending aorta are structurally normal, with no evidence of dilitation. Venous: The inferior vena cava is dilated in size with less than 50% respiratory variability, suggesting right atrial pressure of 15 mmHg. IAS/Shunts: No atrial level shunt detected by color flow Doppler. Additional Comments: A pacer wire is visualized.  LEFT VENTRICLE PLAX 2D LVIDd:         7.60 cm LVIDs:         6.80 cm LV PW:         0.90 cm LV IVS:        0.80 cm LVOT diam:     1.90 cm LV SV:         31 LV SV Index:   15 LVOT Area:     2.84 cm  LV Volumes (MOD) LV vol d, MOD A2C: 203.0 ml LV vol d, MOD A4C: 234.0 ml LV vol s, MOD A2C: 159.0 ml LV vol s, MOD A4C: 167.0 ml LV SV MOD A2C:     44.0 ml LV SV MOD A4C:     234.0 ml LV SV MOD BP:      57.4 ml RIGHT VENTRICLE RV S prime:     9.81 cm/s TAPSE (M-mode): 2.0 cm LEFT ATRIUM              Index       RIGHT ATRIUM           Index LA diam:        5.60 cm  2.61 cm/m  RA Area:     21.10 cm LA Vol (A2C):   91.9 ml  42.80 ml/m RA Volume:   57.10 ml  26.59 ml/m LA Vol (A4C):   126.0 ml 58.68 ml/m LA Biplane Vol: 111.0 ml 51.69 ml/m  AORTIC VALVE LVOT Vmax:   65.27 cm/s LVOT Vmean:  49.800 cm/s LVOT VTI:    0.111 m  AORTA Ao Root diam: 2.70 cm Ao Asc diam:  2.90 cm MR Peak grad:    65.0 mmHg   TRICUSPID VALVE MR Mean grad:    43.0 mmHg   TR Peak grad:   23.0 mmHg  MR Vmax:         403.00 cm/s TR Vmax:        240.00 cm/s MR Vmean:        307.0 cm/s MR PISA:         1.57 cm    SHUNTS MR PISA Eff ROA: 12 mm      Systemic VTI:  0.11 m MR PISA Radius:  0.50 cm     Systemic Diam: 1.90 cm Dani Gobble Croitoru MD Electronically signed by Sanda Klein MD Signature Date/Time: 04/17/2020/6:01:10 PM    Final    IR PERCUTANEOUS ART THROMBECTOMY/INFUSION INTRACRANIAL INC DIAG ANGIO  Result Date: 04/19/2020 INDICATION: New onset right-sided weakness, with slurred speech. 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 EXAM: 1. EMERGENT LARGE VESSEL OCCLUSION THROMBOLYSIS anterior CIRCULATION) COMPARISON:  CT angiogram of the head and neck of April 17, 2020. MEDICATIONS: Ancef 2 g IV antibiotic was administered within 1 hour of the procedure. ANESTHESIA/SEDATION: General anesthesia CONTRAST:  Isovue 300 approximately 200  mL FLUOROSCOPY TIME:  Fluoroscopy Time: 105 minutes 18 seconds (4849 mGy). COMPLICATIONS: None immediate. TECHNIQUE: Following a full explanation of the procedure along with the potential associated complications, an informed witnessed consent was obtained. The risks of intracranial hemorrhage of 10%, worsening neurological deficit, ventilator dependency, death and inability to revascularize were all reviewed in detail with the patient's spouse The patient was then put under general anesthesia by the Department of Anesthesiology at [facility]. The right groin was prepped and draped in the usual sterile fashion. Thereafter using modified Seldinger technique, transfemoral access into the right common femoral artery was obtained without difficulty. Over a 0.035 inch guidewire an 8 French 25 cm Pinnacle sheath was inserted. Through this, and also over a 0.035 inch guidewire a 5 [place] JB 1 catheter was advanced to the aortic arch region and selectively positioned right common carotid artery. FINDINGS: The right common carotid arteriogram  demonstrates the right external carotid artery and its major branches to be widely patent. The right internal carotid artery at the bulb to the cranial skull base is widely patent. The petrous, the cavernous and the supraclinoid segments are widely patent. The left middle cerebral artery demonstrates complete angiographic occlusion at its origin. The left anterior cerebral artery opacifies into the capillary and venous phases. Also noted is a left posterior communicating artery opacifying the left posterior cerebral artery distribution. PROCEDURE: Balloon guide was then advanced to the distal left cervical ICA over a 0.035 inch Roadrunner guidewire. Through this, a combination of an 021 150 cm Trevo ProVue microcatheter inside an 071 Zoom aspiration catheter was advanced over a 0.014 inch standard Synchro micro guidewire with a J configuration to the supraclinoid left ICA. Micro guidewire was then gently manipulated and advanced without difficulty to the M2 region of the inferior division followed by the microcatheter. The guidewire was removed. Good aspiration obtained from the hub of the microcatheter. A gentle contrast injection demonstrated safe position of the tip of the microcatheter. A Tiger 21 retrieval device was then advanced to the distal end of the microcatheter. This was then deployed in the usual manner by retracting the microcatheter. The proximal portion of the Tiger retrieval device was in the proximal occluded segment of the left middle cerebral artery. The aspiration catheter was then advanced to engage the proximal occlusion of the left middle cerebral artery. After approximately 2 minutes of aspiration and during which time the retrieval device was expanded and decreased in diameter, the combination of the retrieval device, the aspiration catheter, and the microcatheter were retrieved and removed. Proximal flow arrest was reversed. A control arteriogram performed through the balloon guide  catheter in the left internal carotid artery demonstrates revascularization of the left middle cerebral M1 segment of the anterior temporal branch, and also the superior division achieving a TICI 2b revascularization. This prompted another pass using the combination of the 071 aspiration catheter with the 021 microcatheter advanced again into the occluded inferior division in the M2 M3 region. Again the micro guidewire was removed. Good aspiration obtained from the hub of the microcatheter. At this time, a 5 mm x 37 mm Embotrap retrieval device was then advanced to the distal end of the microcatheter. This was then deployed in the usual manner with the proximal portion of the device in the proximal portion of the occlusion. The Zoom aspiration catheter was again engaged in the proximal portion of the clot. Continuous aspiration was then performed for approximately 2 minutes at the hub of the aspiration  catheter, and also at the hub of the balloon guide with right proximal flow arrest with aspiration with a 60 mL syringe. Thereafter, the combination of the retrieval device, the microcatheter, and the aspiration catheter were retrieved and removed. Aspiration was reversed. 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 The next pass was made with a Solitaire X 4 x 40 mm, using the above combination as described above. Again there continued to be occlusion of the inferior division. The next stent was with a Zoom 35 aspiration catheter which was advanced inside of a 35 aspiration Zoom catheter and engaged at the proximal portion of the occluded inferior division. Aspiration was continued for approximately 2 minutes. Thereafter, the combination of the aspiration 35 catheter, and the 55 catheter were retrieved and removed. A control arteriogram performed continued to demonstrate occluded  inferior division. The next pass was made with a 6.5 mm x 40 mm Embotrap retrieval device again engaged in the occluded inferior division. Following flow arrest, and aspiration through the 35 catheter and also the 55 aspiration catheter for approximately 2 minutes with proximal flow arrest and a 60 mL syringe at the hub of the balloon guide catheter, the combination was retrieved and removed. Arteriogram performed following reversal of flow arrest in the left internal carotid artery again demonstrated no change in the occluded inferior division. The next pass was made with a 6 mm x 40 mm Solitaire X retrieval device again with proximal flow arrest, and constant aspiration with a 60 mL syringe at the hub of the balloon guide catheter in the left internal carotid artery, and the 55 aspiration catheter engaged in the proximal portion of the clot in the inferior division for approximately 2 minutes. 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 It was, therefore, elected to proceed with placement of a stent to open up the prominent inferior division. Prior to this, the patient was loaded with aspirin 81 mg, and Brilinta 180 mg via an orogastric tube. CT of the brain was performed which showed no evidence of intracranial hemorrhage. Through the balloon guide in left internal carotid artery, a combination of a Catalyst 115 cm guide catheter and an 021 Trevo ProVue microcatheter combination was advanced over a 0.014 inch Aristotle micro guidewire to the supraclinoid left ICA. The micro guidewire was then manipulated with a torque device and advanced through the occluded left MCA inferior division into the M2 M3 region followed by the microcatheter. The guidewire was removed. Good aspiration obtained from the hub of the  microcatheter which was then connected to continuous heparinized saline infusion. Simultaneous arteriograms were performed through the microcatheter and also through the Catalyst guide catheter in the supraclinoid left ICA in order to engage the occluded segment of the inferior division. It was decided to place a 4.5 mm x 30 mm Atlas Neuroform stent. This was then advanced to the distal end of the microcatheter. The O ring on the delivery microcatheter was loosened. The stent was then deployed by slow steady retrieval of the 021 microcatheter. Following the delivery, a control arteriogram performed through the 5 [place] Catalyst guide catheter in the left internal carotid artery demonstrated excellent apposition and coverage at the proximal, and the distal end of the stent.  Also noted now was a TICI 2c revascularization of the left middle cerebral artery. Prior to the placement of the stent, the patient was given a bolus dose of Cangrelor IV followed by a slow infusion for 4 hours. Control arteriogram performed at 10 and 20 minutes post placement of the stent continued to demonstrate wide patency of the stented segment of the inferior division of the left middle cerebral artery. The Catalyst guide catheter was retrieved and removed. Control arteriogram performed through the balloon guide now in the left common carotid artery demonstrated patency of the left internal carotid artery proximally and distally intracranially. There continued to be patency of the left middle cerebral artery with a TICI 2c revascularization. The left anterior cerebral artery continued to be widely patent as well. Also noted was free flow into the left posterior communicating artery and the left posterior cerebral artery distribution. Throughout the procedure, the patient's blood pressure and neurological status remain stable. No evidence of extravasation or dissection was seen. The 8 French balloon guide was removed. An 8 French Angio-Seal  closure device was then placed for hemostasis in the right common femoral puncture site. Distal pulses continued to be Dopplerable in the feet bilaterally. CT of the brain demonstrated no evidence of intracranial hemorrhage, or mass effect or midline shift. Patient was left intubated on account of her medical condition, and low ejection fraction and transferred to the neuro ICU for post revascularization management. 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 PLAN: Follow-up in the clinic 4 weeks post discharge. Electronically Signed   By: Luanne Bras M.D.   On: 04/18/2020 09:51   CT HEAD CODE STROKE WO CONTRAST  Result Date: 04/17/2020 CLINICAL DATA:  Code stroke. Neuro deficit, acute stroke suspected. Right-sided weakness. EXAM: CT HEAD WITHOUT CONTRAST TECHNIQUE: Contiguous axial images were obtained from the base of the skull through the vertex without intravenous contrast. COMPARISON:  None. FINDINGS: Brain: Ill-defined hypodensity of the left putamen (for example see series 3, image 16). No evidence of acute hemorrhage, hydrocephalus, extra-axial collection or mass lesion/mass effect. Vascular: No definite hyperdense vessel. Skull: No acute fracture. Sinuses/Orbits: Left maxillary sinus retention cyst versus polyp. Unremarkable orbits. Other: No mastoid effusions. ASPECTS [facility] Stroke Program Early CT Score) - Ganglionic level infarction (caudate, lentiform nuclei, internal capsule, insula, M1-M3 cortex): 6 - Supraganglionic infarction (M4-M6 cortex): 3 Total score  (0-10 with 10 being normal): 9 IMPRESSION: 1. Subtle hypodensity involving the left putamen, which may represent acute left MCA territory infarct. ASPECTS is 9. MRI could further characterize if clinically indicated. 2. No acute hemorrhage. Code stroke imaging results were communicated on 04/17/2020 at 10:01 am to provider Dr. Leonel Ramsay via telephone, who verbally acknowledged these results. Electronically Signed   By: Margaretha Sheffield MD   On: 04/17/2020 10:07      HISTORY OF PRESENT ILLNESS [name] is a 49 y.o. female with a history of CHF with an EF of 15 to 20% who presented with right-sided weakness.  She woke up around 6:30 AM, already with some problems with her right  side, but she was still able to walk at that time and walked downstairs.  Subsequently around 8:30 AM, she was able to call out for her husband who found her having difficulty with speaking and with complete right-sided paralysis.  EMS was called and a code stroke was activated and she was brought in emergently.  She was taken for CT where there is question of mild left putaminal change, as well as a left M1 with some collateralization and large area of penumbra on CT perfusion.  HOSPITAL COURSE  ELMINA HENDEL is a 49 y.o. 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 MRI could not be done due to ICD. She underwent mechanical thrombectomy with placement of stent at M1/M2 left MCA. Repeat CTA showed stent with nonocclusive thrombus along the posterior wall mid stent. The day after intervention she was successfully extubated. 2D echo showed EF 20-25%, which is chronic and is treated within the heart failure clinic. No thrombus was seen on echo. Interrogation of ICD showed no atrial fibrillation. Right sided weakness resolved, and she was discharged with plans for outpatient PT/OT. She was discharged on Brillinta 90  mg bid and aspirin 81 mg qd. She will follow-up with neurology in six weeks and will follow-up with Dr. Arlean Hopping office.    Cardiology was consulted for management of her heart failure. With softer blood pressures during admission entresto was held and coreg decreased 6.25 mg bid. Her torsemide was increased to 80 mg qam and 40 mg qam with respective increase in her potassium supplementation. Other medications were continued at discharge. She will follow-up in the HF clinic in one week.   DISCHARGE EXAM Blood pressure 108/67, pulse 77, temperature 97.8 F (36.6 C), temperature source Oral, resp. rate 16, height 5' 3.5" (1.613 m), weight 115.3 kg, SpO2 100 %.  Constitution: NAD, sitting up at bedside  Cardio: regular rate & rhythm Respiratory: non-labored breathing, on room air Neuro: Mental Status: Patient is awake, alert and oriented x 3, answering questions and able to follow commands, repetition, naming intact, with possibly very slight aphasia.   Cranial Nerves: II: visual fields intact  III,IV, VI: EOMI without ptosis or diploplia. Pupils equal, round and reactive to light V: facial sensation intact VII: smile symmetric, no facial droop VIII: hearing is intact to voice XI: Shoulder shrug is symmetric. XII: tongue midline, no fasculations  Motor: right: strength 5/5 and symmetric, no upper or lower extremity drift  left: strength 5/5, no upper or lower extremity drift   Sensory: Sensation intact  Discharge Diet       Diet   Diet Heart Room service appropriate? Yes with Assist; Fluid consistency: Thin   liquids  DISCHARGE PLAN Disposition:  Follow-up in neurology clinic in six weeks.  Brilinta (ticagrelor) 90 mg bid and aspirin 81 mg for secondary stroke prevention. Ongoing stroke risk factor control by Primary Care Physician at time of discharge Follow-up PCP [name], MD in 2 weeks. Follow-up in [facility] in 6 weeks, office to  schedule an appointment.   Time spent on discharge summary 35 minutes.I have personally obtained history,examined this patient, reviewed notes, independently viewed imaging studies, participated in medical decision making and plan of care.ROS completed by me personally and pertinent positives fully documented  I have made any additions or clarifications directly to the above note. Agree with note  above.    Antony Contras, MD Medical Director [facility] Pager: [phone] 04/23/2020 5:55 PM

## 2020-04-20 NOTE — Progress Notes (Signed)
The patient has been having frequent PVCs and VTs but she's asymptomatic. Paged Dr. Theda Sers and awaiting a call back.

## 2020-04-23 ENCOUNTER — Ambulatory Visit (INDEPENDENT_AMBULATORY_CARE_PROVIDER_SITE_OTHER): Payer: BC Managed Care – PPO

## 2020-04-23 DIAGNOSIS — Z95810 Presence of automatic (implantable) cardiac defibrillator: Secondary | ICD-10-CM

## 2020-04-23 DIAGNOSIS — I5022 Chronic systolic (congestive) heart failure: Secondary | ICD-10-CM | POA: Diagnosis not present

## 2020-04-24 ENCOUNTER — Encounter (HOSPITAL_COMMUNITY): Payer: BC Managed Care – PPO | Admitting: Cardiology

## 2020-04-25 ENCOUNTER — Other Ambulatory Visit: Payer: Self-pay

## 2020-04-25 ENCOUNTER — Encounter (HOSPITAL_COMMUNITY): Payer: Self-pay | Admitting: Cardiology

## 2020-04-25 ENCOUNTER — Other Ambulatory Visit (HOSPITAL_COMMUNITY): Payer: Self-pay | Admitting: Cardiology

## 2020-04-25 ENCOUNTER — Encounter (HOSPITAL_COMMUNITY): Payer: Self-pay | Admitting: Anesthesiology

## 2020-04-25 ENCOUNTER — Ambulatory Visit (HOSPITAL_COMMUNITY)
Admit: 2020-04-25 | Discharge: 2020-04-25 | Disposition: A | Discharge status: Home / Self Care | Payer: BC Managed Care – PPO | Source: Ambulatory Visit | Attending: Cardiology | Admitting: Cardiology

## 2020-04-25 VITALS — BP 110/84 | HR 80 | Wt 234.8 lb

## 2020-04-25 DIAGNOSIS — I472 Ventricular tachycardia, unspecified: Secondary | ICD-10-CM

## 2020-04-25 DIAGNOSIS — Z79899 Other long term (current) drug therapy: Secondary | ICD-10-CM | POA: Diagnosis not present

## 2020-04-25 DIAGNOSIS — Z7982 Long term (current) use of aspirin: Secondary | ICD-10-CM | POA: Diagnosis not present

## 2020-04-25 DIAGNOSIS — Z6841 Body Mass Index (BMI) 40.0 and over, adult: Secondary | ICD-10-CM | POA: Insufficient documentation

## 2020-04-25 DIAGNOSIS — I428 Other cardiomyopathies: Secondary | ICD-10-CM | POA: Insufficient documentation

## 2020-04-25 DIAGNOSIS — I110 Hypertensive heart disease with heart failure: Secondary | ICD-10-CM | POA: Insufficient documentation

## 2020-04-25 DIAGNOSIS — E039 Hypothyroidism, unspecified: Secondary | ICD-10-CM

## 2020-04-25 DIAGNOSIS — Z95810 Presence of automatic (implantable) cardiac defibrillator: Secondary | ICD-10-CM | POA: Diagnosis not present

## 2020-04-25 DIAGNOSIS — Z79890 Hormone replacement therapy (postmenopausal): Secondary | ICD-10-CM | POA: Insufficient documentation

## 2020-04-25 DIAGNOSIS — I493 Ventricular premature depolarization: Secondary | ICD-10-CM | POA: Diagnosis not present

## 2020-04-25 DIAGNOSIS — I5022 Chronic systolic (congestive) heart failure: Secondary | ICD-10-CM | POA: Insufficient documentation

## 2020-04-25 DIAGNOSIS — I272 Pulmonary hypertension, unspecified: Secondary | ICD-10-CM | POA: Insufficient documentation

## 2020-04-25 DIAGNOSIS — I69351 Hemiplegia and hemiparesis following cerebral infarction affecting right dominant side: Secondary | ICD-10-CM | POA: Insufficient documentation

## 2020-04-25 DIAGNOSIS — E785 Hyperlipidemia, unspecified: Secondary | ICD-10-CM | POA: Insufficient documentation

## 2020-04-25 DIAGNOSIS — Z7984 Long term (current) use of oral hypoglycemic drugs: Secondary | ICD-10-CM | POA: Diagnosis not present

## 2020-04-25 DIAGNOSIS — Z8249 Family history of ischemic heart disease and other diseases of the circulatory system: Secondary | ICD-10-CM | POA: Insufficient documentation

## 2020-04-25 LAB — COMPREHENSIVE METABOLIC PANEL
ALT: 14 U/L (ref 0–44)
AST: 17 U/L (ref 15–41)
Albumin: 4 g/dL (ref 3.5–5.0)
Alkaline Phosphatase: 71 U/L (ref 38–126)
Anion gap: 11 (ref 5–15)
BUN: 16 mg/dL (ref 6–20)
CO2: 21 mmol/L — ABNORMAL LOW (ref 22–32)
Calcium: 10 mg/dL (ref 8.9–10.3)
Chloride: 105 mmol/L (ref 98–111)
Creatinine, Ser: 1.3 mg/dL — ABNORMAL HIGH (ref 0.44–1.00)
GFR, Estimated: 50 mL/min — ABNORMAL LOW (ref >60)
Glucose, Bld: 96 mg/dL (ref 70–99)
Potassium: 4.5 mmol/L (ref 3.5–5.1)
Sodium: 137 mmol/L (ref 135–145)
Total Bilirubin: 0.5 mg/dL (ref 0.3–1.2)
Total Protein: 8.3 g/dL — ABNORMAL HIGH (ref 6.5–8.1)

## 2020-04-25 LAB — TSH: TSH: 3.089 u[IU]/mL (ref 0.350–4.500)

## 2020-04-25 LAB — DIGOXIN LEVEL: Digoxin Level: 1.3 ng/mL (ref 1.0–2.0)

## 2020-04-25 MED ORDER — ENTRESTO 24-26 MG PO TABS
1.0000 | ORAL_TABLET | Freq: Two times a day (BID) | ORAL | 5 refills | Status: DC
Start: 2020-04-25 — End: 2020-05-07

## 2020-04-25 NOTE — Progress Notes (Signed)
ID:  [name], DOB 1970-10-04, MRN [id number]    Provider location: [place] [facility] Type of Visit: Established patient   PCP:  [name], MD    Cardiologist:  [name], MD  History of Present Illness: [name] is a 49 y.o. 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  She was followed for systolic CHF initially in [place]. Apparently her EF recovered to 35-40%. However, she was admitted to [facility] for worsening dyspnea and CP. Coronaries were reportedly OK on LHC at that time at [facility]. Echo showed EF of 15-20% with four-chamber enlargement with moderate-severe TR/MR. RHC as below. Rheumatological w/u negative.  CPX in 9/16 showed near-normal functional capacity.  Repeat echo in 6/17 shows that EF remains 20%. Repeat CPX in 1/18 was submaximal but probably only mild circulatory limitation.  Echo 8/18 showed EF 25-30%, severe LV dilation.  CPX in 6/19 showed low normal functional capacity.  Echo in 10/19 showed EF 20-25%, moderate LV dilation.   Zio patch in 2/20 with 11% PVCs and NSVT, amiodarone started. Zio patch in 3/20 with 13.3% PVCs and NSVT.  She saw Dr. [name], he thought that increased PVCs were electromechanical in nature due to worsening heart failure.  Repeat Zio patch on amiodarone in 1/21 showed 6.2% PVCs.   She was admitted in 4/20 with VT in setting of hypokalemia.    CPX in 9/20 showed mild functional limitation, more due to body habitus than heart failure.  Echo in 4/21 showed EF 20% with severe LV dilation, diffuse hypokinesis, normal RV size and systolic function.   In 8/21, she had a shock for VF in the setting of hypokalemia.   Patient was admitted in 10/21 with CVA involving left MCA territory.  This was thought to be cardioembolic but LV thrombus was not visualized and no atrial fibrillation has been seen.  Echo  in 10/21 showed EF 20-25%, severe LV dilation (no LV thrombus seen), mildly decreased RV systolic function, mild-moderate MR. She was taken by IR for thrombectomy and stent placement, now on ASA 81 and ticagrelor.   She returns for followup of CHF.  She is using her CPAP at night.  She has some tingling of her right leg and her right side is still weaker (mildly).  She has not yet been contacted by PT.  She is fatigued with exertion, has been short of breath walking around the house.   Not lightheaded, BP stable today.  No chest pain.  No PND.   Medtronic device interrogation: Fluid index recently > threshold, now back to well below threshold.   REDS clip 34%.   RHC Procedural Findings: RA mean 5 RV 55/8 PA 56/31, mean 40 PCWP mean 24 Oxygen saturations: PA 61% AO 99% Cardiac Output (Fick) 4.3  Cardiac Index (Fick) 2.0 PVR 3.7 WU  RHC 09/24/13  RA 14  RV 57/22  PA 64/36 (48)  PCWP 18  Fick CO/CI: 3.7/1.6  PVR 8.1 WU  Ao sat 95%  PA sat 47%   CPX 06/27/2016  Peak VO2: 13.5 (56.2% predicted peak VO2) VE/VCO2 slope: 33 OUES: 2.16 Peak RER: 0.98  CPX  1/18 Peak VO2 13.5 VE/VCO2 slope 33 RER 0.98 Submaximal study, mild circulatory limitation, suspect more limited by body habitus.   CPX 8/21 Peak VO2 11.6 VE/VCO2 slope 32 RER 1.2 Mild functional limitation primarily  due to obesity and deconditioning.   Echo (8/15) with EF 20%, moderate LV dilation, diffuse hypokinesis, normal RV size with mildly decreased systolic function, mild MR.  Echo (5/16) with EF 20%, spherical LV with diffuse hypokinesis, mild MR.  Echo (6/17) with EF 20%, diffuse hypokinesis, moderate LV dilation, normal RV, mild to moderate MR.  Echo (8/18) with EF 25-30%, severe LV dilation, severe LAE.  Echo (2/20) with EF 20-25%, moderate LV dilation, moderate MR.  Echo (4/21) with EF 20-25%, severe LV dilation, mild-moderate MR, normal RV, PASP 26 mmHg.  Echo (10/21) with   Sleep study did not show  significant OSA.   RHC (8/15): Mean RA 3 PA 33/11 mean 19 Mean PCWP 8 CI 2.35  Holter (8/15) with < 1% PVCs Zio patch (1/21) with 6.2% PVCs  CPX 9/16 Peak VO2 16.2 VE/VCO2 slope 23.6 RER 1.25 Normal functional capacity  CPX (6/19): peak VO2 14.3 (83% predicted), VE/VCO2 slope 30 => low normal functional capacity.   CPX (9/20): peak VO2 14.2, VE/VCO2 slope 29, RER 1.13 => mild functional limitation due to body habitus and heart failure.   Labs: (11/09/13): Dig level 0.5, K 3.6, Creatinine 0.83, pro-BNP 622 (12/19/13): K 3.6, creatinine 0.8 (7/15): K 4.1, creatinine 0.91 (8/15): K 3.9, creatinine 0.91 (10/15): K 4, creatinine 0.93, proBNP 129 (12/15): K 4, creatinine 0.9, digoxin 0.4 (5/16): K 4, creatinine 0.86 (9/16): K 3.6, creatinine 0.86, digoxin < 0.2 (5/17): K 4.1, creatinine 0.95, digoxin 0.3 (5/18): K 3.4, creatinine 0.95, TSH elevated but free T3 and free T4 normal.  (8/18): K 3.9, creatinine 0.87, BNP 108  (11/18): K 3.5, creatinine 1.04, digoxin 0.4 (1/19): LDL 91, HDL 47 (3/19): digoxin 0.7, K 4, creatinine 0.99 (4/19): TSH normal, LDL 95 (9/19): LDL 99, HDL 45, K 4, creatinine 0.96 (10/19): K 3.6, creatinine 0.85, digoxin 0.5 (2/20): LDL 103 (3/20): K 3.9, creatinine 1.05 (4/20): K 3.9, creatinine 1.23 => 1.15, TSH 8, digoxin 0.3, LFTs normal 6/20): K 4.3, creatinine 1.07, LFTs normal, digoxin 0.5 (7/20): K 3.8, creatinine 1.16 (8/20): digoxin < 0.2, LDL 135 (1/21): K 3.9, creatinine 1.05, LDL 171  (4/21): K 4.2, creatinine 0.95, LDL 127, TSH normal, LFTs normal (10/21): K 2.8, creatinine 1.21, LDL 130  Current Outpatient Medications  Medication Sig Dispense Refill  . aspirin 81 MG chewable tablet Chew 1 tablet (81 mg total) by mouth daily. 30 tablet 11  . atorvastatin (LIPITOR) 40 MG tablet Take 40 mg by mouth daily.    . carvedilol (COREG) 6.25 MG tablet Take 1 tablet (6.25 mg total) by mouth 2 (two) times daily with a meal. 60 tablet 0  .  dapagliflozin propanediol (FARXIGA) 10 MG TABS tablet Take 10 mg by mouth daily before breakfast. 30 tablet 5  . digoxin (LANOXIN) 0.125 MG tablet Take 0.125 mg by mouth daily.    Marland Kitchen levothyroxine (SYNTHROID) 25 MCG tablet Take 1 tablet (25 mcg total) by mouth daily before breakfast. 90 tablet 3  . metolazone (ZAROXOLYN) 2.5 MG tablet Take 1 tablet (2.5 mg total) by mouth as needed (extra fluid as directed by HF clinic). 5 tablet 3  . spironolactone (ALDACTONE) 25 MG tablet Take 25 mg by mouth daily.    . ticagrelor (BRILINTA) 90 MG TABS tablet Take 1 tablet (90 mg total) by mouth 2 (two) times daily. 60 tablet 11  . torsemide (DEMADEX) 20 MG tablet Take 4 tablets (80 mg total) by mouth in the morning. 90 tablet 0  . torsemide (DEMADEX) 20 MG  tablet Take 2 tablets (40 mg total) by mouth every evening. 60 tablet 0  . Vitamin D, Ergocalciferol, (DRISDOL) 1.25 MG (50000 UNIT) CAPS capsule Take 1 capsule (50,000 Units total) by mouth every 7 (seven) days. 4 capsule 0  . amiodarone (PACERONE) 200 MG tablet TAKE 1 TABLET BY MOUTH DAILY 90 tablet 3  . carvedilol (COREG) 25 MG tablet TAKE 1 TABLET BY MOUTH TWICE A DAY WITH A MEAL 180 tablet 3  . potassium chloride SA (KLOR-CON) 20 MEQ tablet TAKE 2 TABLETS BY MOUTH DAILY 60 tablet 3  . sacubitril-valsartan (ENTRESTO) 24-26 MG Take 1 tablet by mouth 2 (two) times daily. 60 tablet 5   No current facility-administered medications for this encounter.    Allergies:   Patient has no known allergies.   Social History:  The patient  reports that she has never smoked. She has never used smokeless tobacco. She reports that she does not drink alcohol and does not use drugs.   Family History:  The patient's family history includes Heart disease in her maternal grandmother and mother; High blood pressure in her father; Obesity in her mother; Stroke in her father.   ROS:  Please see the history of present illness.   All other systems are personally reviewed and  negative.   Exam:   BP 110/84   Pulse 80   Wt 106.5 kg (234 lb 12.8 oz)   SpO2 100%   BMI 40.94 kg/m  General: NAD, obese Neck: No JVD, no thyromegaly or thyroid nodule.  Lungs: Clear to auscultation bilaterally with normal respiratory effort. CV: Nondisplaced PMI.  Heart regular S1/S2, no S3/S4, no murmur.  No peripheral edema.  No carotid bruit.  Normal pedal pulses.  Abdomen: Soft, nontender, no hepatosplenomegaly, no distention.  Skin: Intact without lesions or rashes.  Neurologic: Alert and oriented x 3. Mildly decreased strength right grip.  Psych: Normal affect. Extremities: No clubbing or cyanosis.  HEENT: Normal.   Recent Labs: 12/30/2019: TSH 4.774 01/26/2020: B Natriuretic Peptide 929.8 04/18/2020: Hemoglobin 9.6; Platelets 294 04/20/2020: Magnesium 1.9 04/25/2020: ALT 14; BUN 16; Creatinine, Ser 1.30; Potassium 4.5; Sodium 137  Personally reviewed   Wt Readings from Last 3 Encounters:  04/25/20 106.5 kg (234 lb 12.8 oz)  04/20/20 115.3 kg (254 lb 3.1 oz)  02/23/20 110.7 kg (244 lb)      ASSESSMENT AND PLAN:  1. Chronic systolic HF: Nonischemic cardiomyopathy.  She has a Medtronic ICD. Cause for cardiomyopathy is uncertain: initially noted peri-partum (after son's birth), so cannot rule out peri-partum CMP.  On and off, she has had frequent PVCs.   RHC in 8/15 showed normal filling pressures and preserved cardiac output.  She does not qualify for CRT with relatively narrow QRS.  CPX 9/20 with mildly decreased functional capacity due to body habitus and HF.  CPX in 8/21 also showed mild functional limitation.  Echo in 10/21 showed stable EF 20-25% with severe LV dilation, mildly decreased RV systolic function, and mild-moderate MR. NYHA class III symptoms but she does not look volume overloaded on exam and thoracic impedance stable on Medtronic device.  REDS clip also does not suggestive volume overload. Possibly deconditioning/muscle weakness from a number of days in bed  after her CVA.  BP stable.  After recent hospitalization for CVA, she is on lower doses of her cardiac meds.    - Continue torsemide 80 qam/40 qpm as she looks euvolemic. BMET today.    - Restart Entresto at lower dose, 24/26 bid.  BMET 10 days.  - She is on a lower Coreg dose 6.25 mg bid.  Given history of chronotropic incompetence, will leave at this dose for now.   - Continue spironolactone 25 mg daily and digoxin, check digoxin level today.   - Continue dapagliflozin 10 mg daily.  - EF remains low, if she worsens clinically would be candidate for LVAD.  BMI too high currently for transplant.   2. Pulmonary HTN: Mixed picture with PVR 8.1 on RHC at [facility].  However, repeat study in 8/15 here showed no significant pulmonary hypertension.  3. Morbid obesity: Continue to work on weight loss.  [facility]  Weight is coming down, she has lost 10 lbs again since last appointment.  4. Hyperlipidemia: Continue atorvastatin.  5. PVCs: 11% PVCs + NSVT on 2/20 Zio patch.  Amiodarone started, but PVCs up to 13.3% with NSVT on 3/20 Zio patch. She saw Dr. [name] who thought that the ventricular arrhythmias were electromechanical due to worsening HF.  In 4/20, she had VT x 2 with ICD discharges in the setting of hypokalemia. Zio patch in 1/21 with PVCs down to 6.2% of beats.  - Continue amiodarone, check LFTs and TSH today.  She will need a regular eye exam with amiodarone use.  6. Hypothyroidism: She is on levothyroxine now. TSH today.   Recommended follow-up:  10 days with NP/PA to titrate her cardiac meds back up to her home regimen (though would keep her on lower dose of Coreg given chronotropic incompetence).   Signed, [name], MD  04/25/2020  [facility] [address] [facility] and [place] [address] [phone] (office) [phone] (fax)

## 2020-04-25 NOTE — Patient Instructions (Signed)
Labs done today. We will contact you only if your labs are abnormal.  START Entresto 24-26mg ( 1 tablet) by mouth 2 times daily.  No other medication changes were made. Please continue all other medications as prescribed.   Your physician recommends that you schedule a follow-up appointment in: 10 days with Dr. [name]  If you have any questions or concerns before your next appointment please send [name] a message through [facility] or call our office at [phone].    TO LEAVE A MESSAGE FOR THE NURSE SELECT OPTION 2, PLEASE LEAVE A MESSAGE INCLUDING: . YOUR NAME . DATE OF BIRTH . CALL BACK NUMBER . REASON FOR CALL**this is important as we prioritize the call backs  YOU WILL RECEIVE A CALL BACK THE SAME DAY AS LONG AS YOU CALL BEFORE 4:00 PM  Do the following things EVERYDAY: 1) Weigh yourself in the morning before breakfast. Write it down and keep it in a log. 2) Take your medicines as prescribed 3) Eat low salt foods--Limit salt (sodium) to 2000 mg per day.  4) Stay as active as you can everyday 5) Limit all fluids for the day to less than 2 liters   At the [facility], you and your health needs are our priority. As part of our continuing mission to provide you with exceptional heart care, we have created designated Provider Care Teams. These Care Teams include your primary Cardiologist (physician) and Advanced Practice Providers (APPs- Physician Assistants and Nurse Practitioners) who all work together to provide you with the care you need, when you need it.   You may see any of the following providers on your designated Care Team at your next follow up: Marland Kitchen Dr Glori Bickers . Dr [name] . Darrick Grinder, NP . Lyda Jester, PA . Audry Riles, PharmD   Please be sure to bring in all your medications bottles to every appointment.

## 2020-04-26 ENCOUNTER — Telehealth (HOSPITAL_COMMUNITY): Payer: Self-pay

## 2020-04-26 MED ORDER — DIGOXIN 125 MCG PO TABS
0.0625 mg | ORAL_TABLET | Freq: Every day | ORAL | 11 refills | Status: DC
Start: 2020-04-26 — End: 2020-08-30

## 2020-04-26 NOTE — Telephone Encounter (Signed)
-----   Message from Larey Dresser, MD sent at 04/26/2020  8:55 AM EDT ----- Decrease digoxin to 0.0625, check level as trough at next lab appt.

## 2020-04-26 NOTE — Telephone Encounter (Signed)
Patient advised and verbalized understanding. Med list updated to reflect changes. Patient will hold digoxin the day of next appt

## 2020-04-27 NOTE — Progress Notes (Signed)
EPIC Encounter for ICM Monitoring  Patient Name: [name] is a 49 y.o. female Date: 04/27/2020 Primary Care Physican: [name], MD Primary Cardiologist:[place] Electrophysiologist:[place] 03/28/2020 Weight: 242lbs  Spoke with patient and reports feeling okay despite having a stoke in October.   Hospitalization 10/26 with dx of stroke.  OptivolThoracicimpedancesuggesting possible fluid accumulation since 03/09/2020.  Prescribed:  Torsemide20 mgTake 3tablets(60 mg total)by mouth every evening.   Potassium 20 mEqTake2tablets (40 mEq total) by mouthtwice a day  Spironolactone 25 mg take 1 tabletat bedtime.   Metolazone 2.5 mgTake 2.5 mg by mouth as needed (extra fluid as directed by HF clinic).  Labs: 02/23/2020 Creatinine1.17, Retta Diones, Potassium3.5, Sodium138, GFR55->60 02/08/2020 Creatinine1.26, BUN18, Potassium4.4, Sodium138, FUX32-35  01/31/2020 Creatinine1.31, BUN15, Potassium3.3, [id number], [id number] 01/26/2020 Creatinine 1.41, BUN 18, Potassium 4.2, Sodium 136, GFR 44-51 12/30/2019 Creatinine 1.22, BUN 18, Potassium 3.5, Sodium 138, GFR 52->60 A complete set of results can be found in Results Review.  Recommendations: Patient was seen in office on 11/3 by Dr [name].  No changes and encouraged to call if experiencing any fluid symptoms.  Follow-up plan: ICM clinic phone appointment on12/11/2019. 91 day device clinic remote transmission12/27/2021.   EP/Cardiology Office Visits:05/07/2020 with Dr [name].11/24/2021with Dr [name].   Copy of ICM check sent to [place]  3 month ICM trend: 04/23/2020    1 Year ICM trend:       [name], RN 04/27/2020 9:06 AM

## 2020-05-02 ENCOUNTER — Encounter (HOSPITAL_COMMUNITY): Payer: Self-pay | Admitting: *Deleted

## 2020-05-02 NOTE — Progress Notes (Signed)
Pt's husbands FMLA forms completed and signed by Dr [name] and faxed into [employer] at [phone]  Pt is aware and copy mailed to them for their records.

## 2020-05-07 ENCOUNTER — Ambulatory Visit (HOSPITAL_COMMUNITY)
Admission: RE | Admit: 2020-05-07 | Discharge: 2020-05-07 | Disposition: A | Discharge status: Home / Self Care | Payer: BC Managed Care – PPO | Source: Ambulatory Visit | Attending: Cardiology | Admitting: Cardiology

## 2020-05-07 ENCOUNTER — Encounter (HOSPITAL_COMMUNITY): Payer: Self-pay | Admitting: Cardiology

## 2020-05-07 ENCOUNTER — Other Ambulatory Visit: Payer: Self-pay

## 2020-05-07 ENCOUNTER — Encounter: Payer: Self-pay | Admitting: *Deleted

## 2020-05-07 VITALS — BP 110/78 | HR 75 | Wt 233.8 lb

## 2020-05-07 DIAGNOSIS — Z006 Encounter for examination for normal comparison and control in clinical research program: Secondary | ICD-10-CM

## 2020-05-07 DIAGNOSIS — Z7982 Long term (current) use of aspirin: Secondary | ICD-10-CM | POA: Insufficient documentation

## 2020-05-07 DIAGNOSIS — I5022 Chronic systolic (congestive) heart failure: Secondary | ICD-10-CM | POA: Diagnosis not present

## 2020-05-07 DIAGNOSIS — Z8673 Personal history of transient ischemic attack (TIA), and cerebral infarction without residual deficits: Secondary | ICD-10-CM | POA: Insufficient documentation

## 2020-05-07 DIAGNOSIS — E785 Hyperlipidemia, unspecified: Secondary | ICD-10-CM | POA: Insufficient documentation

## 2020-05-07 DIAGNOSIS — I272 Pulmonary hypertension, unspecified: Secondary | ICD-10-CM | POA: Insufficient documentation

## 2020-05-07 DIAGNOSIS — E039 Hypothyroidism, unspecified: Secondary | ICD-10-CM | POA: Insufficient documentation

## 2020-05-07 DIAGNOSIS — I110 Hypertensive heart disease with heart failure: Secondary | ICD-10-CM | POA: Insufficient documentation

## 2020-05-07 DIAGNOSIS — Z95810 Presence of automatic (implantable) cardiac defibrillator: Secondary | ICD-10-CM | POA: Diagnosis not present

## 2020-05-07 DIAGNOSIS — Z79899 Other long term (current) drug therapy: Secondary | ICD-10-CM | POA: Insufficient documentation

## 2020-05-07 DIAGNOSIS — I428 Other cardiomyopathies: Secondary | ICD-10-CM | POA: Insufficient documentation

## 2020-05-07 DIAGNOSIS — Z79890 Hormone replacement therapy (postmenopausal): Secondary | ICD-10-CM | POA: Insufficient documentation

## 2020-05-07 DIAGNOSIS — I493 Ventricular premature depolarization: Secondary | ICD-10-CM | POA: Insufficient documentation

## 2020-05-07 DIAGNOSIS — Z8249 Family history of ischemic heart disease and other diseases of the circulatory system: Secondary | ICD-10-CM | POA: Insufficient documentation

## 2020-05-07 LAB — BASIC METABOLIC PANEL
Anion gap: 12 (ref 5–15)
BUN: 9 mg/dL (ref 6–20)
CO2: 26 mmol/L (ref 22–32)
Calcium: 9.5 mg/dL (ref 8.9–10.3)
Chloride: 99 mmol/L (ref 98–111)
Creatinine, Ser: 1.28 mg/dL — ABNORMAL HIGH (ref 0.44–1.00)
GFR, Estimated: 51 mL/min — ABNORMAL LOW (ref >60)
Glucose, Bld: 82 mg/dL (ref 70–99)
Potassium: 3.2 mmol/L — ABNORMAL LOW (ref 3.5–5.1)
Sodium: 137 mmol/L (ref 135–145)

## 2020-05-07 MED ORDER — ENTRESTO 49-51 MG PO TABS
1.0000 | ORAL_TABLET | Freq: Two times a day (BID) | ORAL | 3 refills | Status: DC
Start: 2020-05-07 — End: 2020-06-04

## 2020-05-07 NOTE — Research (Signed)
Spoke with patient today about batwire. Patient just had stroke back in Oct will talk and screen in January. Reviewed material with patient and sent her home with information about study.

## 2020-05-07 NOTE — Patient Instructions (Signed)
INCREASE Entresto 49/51mg  (1 tablet) twice daily  Labs done today, your results will be available in MyChart, we will contact you for abnormal readings.  Your physician recommends that you schedule repeat labs in 2 weeks  Your physician recommends that you schedule a follow-up appointment in: 2 months  If you have any questions or concerns before your next appointment please send [name] a message through [place] or call our office at [phone].    TO LEAVE A MESSAGE FOR THE NURSE SELECT OPTION 2, PLEASE LEAVE A MESSAGE INCLUDING: . YOUR NAME . DATE OF BIRTH . CALL BACK NUMBER . REASON FOR CALL**this is important as we prioritize the call backs  YOU WILL RECEIVE A CALL BACK THE SAME DAY AS LONG AS YOU CALL BEFORE 4:00 PM

## 2020-05-08 NOTE — Progress Notes (Signed)
ID:  [name], DOB [birth year], MRN [id number]    Provider location: [facility] Type of Visit: Established patient   PCP:  [name], MD    Cardiologist:  [name], MD  History of Present Illness: [name] is a 49 y.o. 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  She was followed for systolic CHF initially in [place]. Apparently her EF recovered to 35-40%. However, she was admitted to [facility] 09/19/13 for worsening dyspnea and CP. Coronaries were reportedly OK on LHC at that time at [facility]. Echo showed EF of 15-20% with four-chamber enlargement with moderate-severe TR/MR. RHC as below. Rheumatological w/u negative.  CPX in 9/16 showed near-normal functional capacity.  Repeat echo in 6/17 shows that EF remains 20%. Repeat CPX in 1/18 was submaximal but probably only mild circulatory limitation.  Echo 8/18 showed EF 25-30%, severe LV dilation.  CPX in 6/19 showed low normal functional capacity.  Echo in 10/19 showed EF 20-25%, moderate LV dilation.   Zio patch in 2/20 with 11% PVCs and NSVT, amiodarone started. Zio patch in 3/20 with 13.3% PVCs and NSVT.  She saw Dr. [name], he thought that increased PVCs were electromechanical in nature due to worsening heart failure.  Repeat Zio patch on amiodarone in 1/21 showed 6.2% PVCs.   She was admitted in 4/20 with VT in setting of hypokalemia.    CPX in 9/20 showed mild functional limitation, more due to body habitus than heart failure.  Echo in 4/21 showed EF 20% with severe LV dilation, diffuse hypokinesis, normal RV size and systolic function.   In 8/21, she had a shock for VF in the setting of hypokalemia.   Patient was admitted in 10/21 with CVA involving left MCA territory.  This was thought to be cardioembolic but LV thrombus was not visualized and no atrial fibrillation has been seen.  Echo  in 10/21 showed EF 20-25%, severe LV dilation (no LV thrombus seen), mildly decreased RV systolic function, mild-moderate MR. She was taken by IR for thrombectomy and stent placement, now on ASA 81 and ticagrelor.   She returns for followup of CHF.  She is using her CPAP at night. She still has some tingling in her right leg and hand and weakness in right hand grip since her CVA.  She is short of breath walking up a flight of stairs but generally does ok walking on flat ground.  She is easily fatigued. Weight is down 1 lb.   Medtronic device interrogation: Fluid index < threshold, no VT.    REDS clip 34%.   RHC Procedural Findings: RA mean 5 RV 55/8 PA 56/31, mean 40 PCWP mean 24 Oxygen saturations: PA 61% AO 99% Cardiac Output (Fick) 4.3  Cardiac Index (Fick) 2.0 PVR 3.7 WU  RHC 09/24/13  RA 14  RV 57/22  PA 64/36 (48)  PCWP 18  Fick CO/CI: 3.7/1.6  PVR 8.1 WU  Ao sat 95%  PA sat 47%   CPX 06/27/2016  Peak VO2: 13.5 (56.2% predicted peak VO2) VE/VCO2 slope: 33 OUES: 2.16 Peak RER: 0.98  CPX  1/18 Peak VO2 13.5 VE/VCO2 slope 33 RER 0.98 Submaximal study, mild circulatory limitation, suspect more limited by body habitus.   CPX 8/21 Peak VO2 11.6 VE/VCO2 slope 32 RER 1.2 Mild functional limitation primarily due to obesity and deconditioning.   Echo (8/15)  with EF 20%, moderate LV dilation, diffuse hypokinesis, normal RV size with mildly decreased systolic function, mild MR.  Echo (5/16) with EF 20%, spherical LV with diffuse hypokinesis, mild MR.  Echo (6/17) with EF 20%, diffuse hypokinesis, moderate LV dilation, normal RV, mild to moderate MR.  Echo (8/18) with EF 25-30%, severe LV dilation, severe LAE.  Echo (2/20) with EF 20-25%, moderate LV dilation, moderate MR.  Echo (4/21) with EF 20-25%, severe LV dilation, mild-moderate MR, normal RV, PASP 26 mmHg.  Echo (10/21) with   Sleep study did not show significant OSA.   RHC (8/15): Mean RA 3 PA 33/11  mean 19 Mean PCWP 8 CI 2.35  Holter (8/15) with < 1% PVCs Zio patch (1/21) with 6.2% PVCs  CPX 9/16 Peak VO2 16.2 VE/VCO2 slope 23.6 RER 1.25 Normal functional capacity  CPX (6/19): peak VO2 14.3 (83% predicted), VE/VCO2 slope 30 => low normal functional capacity.   CPX (9/20): peak VO2 14.2, VE/VCO2 slope 29, RER 1.13 => mild functional limitation due to body habitus and heart failure.   Labs: (11/09/13): Dig level 0.5, K 3.6, Creatinine 0.83, pro-BNP 622 (12/19/13): K 3.6, creatinine 0.8 (7/15): K 4.1, creatinine 0.91 (8/15): K 3.9, creatinine 0.91 (10/15): K 4, creatinine 0.93, proBNP 129 (12/15): K 4, creatinine 0.9, digoxin 0.4 (5/16): K 4, creatinine 0.86 (9/16): K 3.6, creatinine 0.86, digoxin < 0.2 (5/17): K 4.1, creatinine 0.95, digoxin 0.3 (5/18): K 3.4, creatinine 0.95, TSH elevated but free T3 and free T4 normal.  (8/18): K 3.9, creatinine 0.87, BNP 108  (11/18): K 3.5, creatinine 1.04, digoxin 0.4 (1/19): LDL 91, HDL 47 (3/19): digoxin 0.7, K 4, creatinine 0.99 (4/19): TSH normal, LDL 95 (9/19): LDL 99, HDL 45, K 4, creatinine 0.96 (10/19): K 3.6, creatinine 0.85, digoxin 0.5 (2/20): LDL 103 (3/20): K 3.9, creatinine 1.05 (4/20): K 3.9, creatinine 1.23 => 1.15, TSH 8, digoxin 0.3, LFTs normal 6/20): K 4.3, creatinine 1.07, LFTs normal, digoxin 0.5 (7/20): K 3.8, creatinine 1.16 (8/20): digoxin < 0.2, LDL 135 (1/21): K 3.9, creatinine 1.05, LDL 171  (4/21): K 4.2, creatinine 0.95, LDL 127, TSH normal, LFTs normal (10/21): K 2.8, creatinine 1.21, LDL 130 (11/21): K 4.5, creatinine 1.3, LFTs normal, digoxin 1.3, TSH normal  Current Outpatient Medications  Medication Sig Dispense Refill  . amiodarone (PACERONE) 200 MG tablet TAKE 1 TABLET BY MOUTH DAILY 90 tablet 3  . aspirin 81 MG chewable tablet Chew 1 tablet (81 mg total) by mouth daily. 30 tablet 11  . atorvastatin (LIPITOR) 40 MG tablet Take 40 mg by mouth daily.    . carvedilol (COREG) 25 MG tablet  TAKE 1 TABLET BY MOUTH TWICE A DAY WITH A MEAL 180 tablet 3  . carvedilol (COREG) 6.25 MG tablet Take 1 tablet (6.25 mg total) by mouth 2 (two) times daily with a meal. 60 tablet 0  . dapagliflozin propanediol (FARXIGA) 10 MG TABS tablet Take 10 mg by mouth daily before breakfast. 30 tablet 5  . digoxin (LANOXIN) 0.125 MG tablet Take 0.5 tablets (0.0625 mg total) by mouth daily. 30 tablet 11  . levothyroxine (SYNTHROID) 25 MCG tablet Take 1 tablet (25 mcg total) by mouth daily before breakfast. 90 tablet 3  . metolazone (ZAROXOLYN) 2.5 MG tablet Take 1 tablet (2.5 mg total) by mouth as needed (extra fluid as directed by HF clinic). 5 tablet 3  . potassium chloride SA (KLOR-CON) 20 MEQ tablet TAKE 2 TABLETS BY MOUTH DAILY 60 tablet 3  . spironolactone (ALDACTONE)  25 MG tablet Take 25 mg by mouth daily.    . ticagrelor (BRILINTA) 90 MG TABS tablet Take 1 tablet (90 mg total) by mouth 2 (two) times daily. 60 tablet 11  . torsemide (DEMADEX) 20 MG tablet Take 4 tablets (80 mg total) by mouth in the morning. 90 tablet 0  . Vitamin D, Ergocalciferol, (DRISDOL) 1.25 MG (50000 UNIT) CAPS capsule Take 1 capsule (50,000 Units total) by mouth every 7 (seven) days. 4 capsule 0  . sacubitril-valsartan (ENTRESTO) 49-51 MG Take 1 tablet by mouth 2 (two) times daily. 60 tablet 3   No current facility-administered medications for this encounter.    Allergies:   Patient has no known allergies.   Social History:  The patient  reports that she has never smoked. She has never used smokeless tobacco. She reports that she does not drink alcohol and does not use drugs.   Family History:  The patient's family history includes Heart disease in her maternal grandmother and mother; High blood pressure in her father; Obesity in her mother; Stroke in her father.   ROS:  Please see the history of present illness.   All other systems are personally reviewed and negative.   Exam:   BP 110/78   Pulse 75   Wt 106.1 kg (233  lb 12.8 oz)   SpO2 100%   BMI 40.77 kg/m  General: NAD Neck: Thick neck. No JVD, no thyromegaly or thyroid nodule.  Lungs: Clear to auscultation bilaterally with normal respiratory effort. CV: Nondisplaced PMI.  Heart regular S1/S2, no S3/S4, no murmur.  No peripheral edema.  No carotid bruit.  Normal pedal pulses.  Abdomen: Soft, nontender, no hepatosplenomegaly, no distention.  Skin: Intact without lesions or rashes.  Neurologic: Alert and oriented x 3.  Psych: Normal affect. Extremities: No clubbing or cyanosis.  HEENT: Normal.   Recent Labs: 01/26/2020: B Natriuretic Peptide 929.8 04/18/2020: Hemoglobin 9.6; Platelets 294 04/20/2020: Magnesium 1.9 04/25/2020: ALT 14; TSH 3.089 05/07/2020: BUN 9; Creatinine, Ser 1.28; Potassium 3.2; Sodium 137  Personally reviewed   Wt Readings from Last 3 Encounters:  05/07/20 106.1 kg (233 lb 12.8 oz)  04/25/20 106.5 kg (234 lb 12.8 oz)  04/20/20 115.3 kg (254 lb 3.1 oz)      ASSESSMENT AND PLAN:  1. Chronic systolic HF: Nonischemic cardiomyopathy.  She has a Medtronic ICD. Cause for cardiomyopathy is uncertain: initially noted peri-partum (after son's birth), so cannot rule out peri-partum CMP.  On and off, she has had frequent PVCs.   RHC in 8/15 showed normal filling pressures and preserved cardiac output.  She does not qualify for CRT with relatively narrow QRS.  CPX 9/20 with mildly decreased functional capacity due to body habitus and HF.  CPX in 8/21 also showed mild functional limitation.  Echo in 10/21 showed stable EF 20-25% with severe LV dilation, mildly decreased RV systolic function, and mild-moderate MR. NYHA class III symptoms but she does not look volume overloaded on exam and thoracic impedance stable on Medtronic device.  Suspect her fatigue is in part due to deconditioning post-CVA.  BP stable.   - Continue torsemide 80 mg daily, dose appears adequate.    - Increase Entresto back to prior dose of 49/51 bid.  BMET today and in  10 days.  - She is on a lower Coreg dose 6.25 mg bid.  Given history of chronotropic incompetence, will leave at this dose for now.   - Continue spironolactone 25 mg daily.  - She is  on lower dose of digoxin with elevated level recently, check level today.   - Continue dapagliflozin 10 mg daily.  - EF remains low, if she worsens clinically would be candidate for LVAD.  BMI too high currently for transplant.   - I had her evaluated for baroreceptor activation therapy (BATwire).  I think she would be a good candidate but must be 90 days out from her CVA.  2. Pulmonary HTN: Mixed picture with PVR 8.1 on RHC at [facility]  However, repeat study in 8/15 here showed no significant pulmonary hypertension.  3. Morbid obesity: Continue to work on weight loss.  She is going to work aggressively on diet and increase her exercise level.  4. Hyperlipidemia: Continue atorvastatin.  5. PVCs: 11% PVCs + NSVT on 2/20 Zio patch.  Amiodarone started, but PVCs up to 13.3% with NSVT on 3/20 Zio patch. She saw Dr. [name] who thought that the ventricular arrhythmias were electromechanical due to worsening HF.  In 4/20, she had VT x 2 with ICD discharges in the setting of hypokalemia. Zio patch in 1/21 with PVCs down to 6.2% of beats.  - Continue amiodarone, recent LFTs and TSH normal.  She will need a regular eye exam with amiodarone use.  6. Hypothyroidism: She is on levothyroxine now. Recent TSH normal.   Recommended follow-up:  2 months.   Signed, [name], MD  05/08/2020  [place] [address] [facility] [place] [address] [phone] (office) [phone] (fax)

## 2020-05-09 ENCOUNTER — Telehealth (HOSPITAL_COMMUNITY): Payer: Self-pay

## 2020-05-09 DIAGNOSIS — I5022 Chronic systolic (congestive) heart failure: Secondary | ICD-10-CM

## 2020-05-09 MED ORDER — POTASSIUM CHLORIDE CRYS ER 20 MEQ PO TBCR
60.0000 meq | EXTENDED_RELEASE_TABLET | Freq: Every day | ORAL | 3 refills | Status: DC
Start: 2020-05-09 — End: 2020-05-22

## 2020-05-09 NOTE — Telephone Encounter (Signed)
Patient advised and verbalized understanding. Lab appt scheduled.lab order entered,med list updated to reflect changes

## 2020-05-09 NOTE — Telephone Encounter (Signed)
-----   Message from Larey Dresser, MD sent at 05/07/2020 10:00 PM EST ----- Increase total daily KCl by 20 mEq.  BMET 10 days.

## 2020-05-14 DIAGNOSIS — G4733 Obstructive sleep apnea (adult) (pediatric): Secondary | ICD-10-CM | POA: Diagnosis not present

## 2020-05-15 ENCOUNTER — Other Ambulatory Visit: Payer: Self-pay

## 2020-05-15 ENCOUNTER — Telehealth (INDEPENDENT_AMBULATORY_CARE_PROVIDER_SITE_OTHER): Payer: BC Managed Care – PPO | Admitting: Cardiology

## 2020-05-15 VITALS — Ht 63.5 in | Wt 233.0 lb

## 2020-05-15 DIAGNOSIS — I639 Cerebral infarction, unspecified: Secondary | ICD-10-CM

## 2020-05-15 DIAGNOSIS — Z95810 Presence of automatic (implantable) cardiac defibrillator: Secondary | ICD-10-CM | POA: Insufficient documentation

## 2020-05-15 DIAGNOSIS — G4733 Obstructive sleep apnea (adult) (pediatric): Secondary | ICD-10-CM

## 2020-05-15 NOTE — Patient Instructions (Signed)
Medication Instructions:  Your physician recommends that you continue on your current medications as directed. Please refer to the Current Medication list given to you today.  *If you need a refill on your cardiac medications before your next appointment, please call your pharmacy*  Follow-Up: At [facility], you and your health needs are our priority.  As part of our continuing mission to provide you with exceptional heart care, we have created designated Provider Care Teams.  These Care Teams include your primary Cardiologist (physician) and Advanced Practice Providers (APPs -  Physician Assistants and Nurse Practitioners) who all work together to provide you with the care you need, when you need it.  Your next appointment:   1 year(s)  The format for your next appointment:   In Person  Provider:   Traci Turner, MD  

## 2020-05-15 NOTE — Progress Notes (Signed)
Virtual Visit via Video Note   This visit type was conducted due to national recommendations for restrictions regarding the COVID-19 Pandemic (e.g. social distancing) in an effort to limit this patient's exposure and mitigate transmission in our community.  Due to her co-morbid illnesses, this patient is at least at moderate risk for complications without adequate follow up.  This format is felt to be most appropriate for this patient at this time.  All issues noted in this document were discussed and addressed.  A limited physical exam was performed with this format.  Please refer to the patient's chart for her consent to telehealth for [facility].  Date:  05/15/2020   ID:  [name], DOB Dec 17, 1970, MRN [id number] The patient was identified using 2 identifiers.  Patient Location: Home Provider Location: Office/Clinic  PCP:  [name], MD  Cardiologist:  [name], MD  Electrophysiologist:  Virl Axe, MD   Evaluation Performed:  Follow-Up Visit  Chief Complaint:  OSA  History of Present Illness:    [name] is a 49 y.o. female with hx of HTN, chronic systolic CHF, morbid obesity and hx of upper airway resistance syndrome with HST in 2015 with AHI 4/hr.  She recently told Dr. [name] that she was very tired and there was concern that she may have worsening sleep apnea.  She would feel tired she would wake up in the am and would have to nap during the day.  She was told that she was snoring loudly as well.  She recently was referred for repeat home sleep study which showed severe OSA with an AHI of 30.3/hr with nadir O2 sat 85%.  She was started on auto CPAP from 4 to 20cm H2O and is now here for followup.   She is doing well with her CPAP device and thinks that she has gotten used to it.  She tolerates the full face mask and feels the pressure is adequate.  Since going on CPAP she feels rested in the am and has no significant daytime sleepiness.  She says that she  has some throat dryness. She does not think that he snores.    The patient does not have symptoms concerning for COVID-19 infection (fever, chills, cough, or new shortness of breath).   Past Medical History:  Diagnosis Date  . Automatic implantable cardioverter-defibrillator in situ   . Chronic CHF (congestive heart failure) (HCC)    a. EF 15-20% b. RHC (09/2013) RA 14, RV 57/22, PA 64/36 (48), PCWP 18, FIck CO/CI 3.7/1.6, PVR 8.1 WU, PA sat 47%   . History of stomach ulcers   . Hypertension   . Hypotension   . Hypothyroidism   . Morbid obesity ([place])   . Myocardial infarction ([place]) 08/2013  . Nocturnal dyspnea   . Nonischemic cardiomyopathy ([place])   . Sinus tachycardia   . Snoring-prob OSA 09/04/2011  . Sprint Fidelis ICD lead RECALL  [id number]   . UARS (upper airway resistance syndrome) 09/04/2011   HST 12/2013:  AHI 4/hr (numerous episodes of airflow reduction that did not have concomitant desaturation)    Past Surgical History:  Procedure Laterality Date  . BREATH TEK H PYLORI N/A 11/09/2014   Procedure: BREATH TEK H PYLORI;  Surgeon: Greer Pickerel, MD;  Location: [facility];  Service: General;  Laterality: N/A;  . CARDIAC CATHETERIZATION  ~ 2006; 09/2013  . CARDIAC CATHETERIZATION N/A 05/23/2016   Procedure: Right Heart Cath;  Surgeon: Larey Dresser, MD;  Location: [facility]  INVASIVE CV LAB;  Service: Cardiovascular;  Laterality: N/A;  . CARDIAC DEFIBRILLATOR PLACEMENT  2006; 12/26/2013   Medtronic [id number]; 12-2013 ICD gen change and RV lead revision with new 8366 RV lead by Dr [name]  . CESAREAN SECTION  1999  . IMPLANTABLE CARDIOVERTER DEFIBRILLATOR GENERATOR CHANGE N/A 12/26/2013   Procedure: IMPLANTABLE CARDIOVERTER DEFIBRILLATOR GENERATOR CHANGE;  Surgeon: Deboraha Sprang, MD;  Location: [facility] CATH LAB;  Service: Cardiovascular;  Laterality: N/A;  . IR CT HEAD LTD  04/17/2020  . IR CT HEAD LTD  04/17/2020  . IR INTRA CRAN STENT  04/17/2020  . IR PERCUTANEOUS ART THROMBECTOMY/INFUSION  INTRACRANIAL INC DIAG ANGIO  04/17/2020  . LEAD REVISION N/A 12/26/2013   Procedure: LEAD REVISION;  Surgeon: Deboraha Sprang, MD;  Location[facility] CATH LAB;  Service: Cardiovascular;  Laterality: N/A;  . RADIOLOGY WITH ANESTHESIA N/A 04/17/2020   Procedure: Code Stroke;  Surgeon: Luanne Bras, MD;  Location: [place];  Service: Radiology;  Laterality: N/A;  . RIGHT HEART CATHETERIZATION N/A 02/15/2014   Procedure: RIGHT HEART CATH;  Surgeon: Larey Dresser, MD;  Location: [facility] CATH LAB;  Service: Cardiovascular;  Laterality: N/A;  . TUBAL LIGATION  1999     Current Meds  Medication Sig  . amiodarone (PACERONE) 200 MG tablet TAKE 1 TABLET BY MOUTH DAILY  . aspirin 81 MG chewable tablet Chew 1 tablet (81 mg total) by mouth daily.  Marland Kitchen atorvastatin (LIPITOR) 40 MG tablet Take 40 mg by mouth daily.  . carvedilol (COREG) 6.25 MG tablet Take 1 tablet (6.25 mg total) by mouth 2 (two) times daily with a meal.  . dapagliflozin propanediol (FARXIGA) 10 MG TABS tablet Take 10 mg by mouth daily before breakfast.  . digoxin (LANOXIN) 0.125 MG tablet Take 0.5 tablets (0.0625 mg total) by mouth daily.  Marland Kitchen levothyroxine (SYNTHROID) 25 MCG tablet Take 1 tablet (25 mcg total) by mouth daily before breakfast.  . metolazone (ZAROXOLYN) 2.5 MG tablet Take 1 tablet (2.5 mg total) by mouth as needed (extra fluid as directed by HF clinic).  . potassium chloride SA (KLOR-CON) 20 MEQ tablet Take 3 tablets (60 mEq total) by mouth daily.  . sacubitril-valsartan (ENTRESTO) 49-51 MG Take 1 tablet by mouth 2 (two) times daily.  Marland Kitchen spironolactone (ALDACTONE) 25 MG tablet Take 25 mg by mouth daily.  . ticagrelor (BRILINTA) 90 MG TABS tablet Take 1 tablet (90 mg total) by mouth 2 (two) times daily.  Marland Kitchen torsemide (DEMADEX) 20 MG tablet Take 4 tablets (80 mg total) by mouth in the morning.  . Vitamin D, Ergocalciferol, (DRISDOL) 1.25 MG (50000 UNIT) CAPS capsule Take 1 capsule (50,000 Units total) by mouth every 7 (seven) days.      Allergies:   Patient has no known allergies.   Social History   Tobacco Use  . Smoking status: Never Smoker  . Smokeless tobacco: Never Used  Vaping Use  . Vaping Use: Never used  Substance Use Topics  . Alcohol use: No  . Drug use: No     Family Hx: The patient's family history includes Heart disease in her maternal grandmother and mother; High blood pressure in her father; Obesity in her mother; Stroke in her father.  ROS:   Please see the history of present illness.     All other systems reviewed and are negative.   Prior CV studies:   The following studies were reviewed today:  Home sleep study and PAP compliance download  Labs/Other Tests and Data Reviewed:  EKG:  No ECG reviewed.  Recent Labs: 01/26/2020: B Natriuretic Peptide 929.8 04/18/2020: Hemoglobin 9.6; Platelets 294 04/20/2020: Magnesium 1.9 04/25/2020: ALT 14; TSH 3.089 05/07/2020: BUN 9; Creatinine, Ser 1.28; Potassium 3.2; Sodium 137   Recent Lipid Panel Lab Results  Component Value Date/Time   CHOL 198 04/18/2020 09:50 AM   CHOL 262 (H) 07/04/2019 01:30 PM   CHOL 183 09/20/2013 04:24 AM   TRIG 92 04/18/2020 09:50 AM   TRIG 105 09/20/2013 04:24 AM   HDL 50 04/18/2020 09:50 AM   HDL 76 07/04/2019 01:30 PM   HDL 40 09/20/2013 04:24 AM   CHOLHDL 4.0 04/18/2020 09:50 AM   LDLCALC 130 (H) 04/18/2020 09:50 AM   LDLCALC 171 (H) 07/04/2019 01:30 PM   LDLCALC 122 (H) 09/20/2013 04:24 AM    Wt Readings from Last 3 Encounters:  05/15/20 233 lb (105.7 kg)  05/07/20 233 lb 12.8 oz (106.1 kg)  04/25/20 234 lb 12.8 oz (106.5 kg)     Risk Assessment/Calculations:      Objective:    Vital Signs:  Ht 5' 3.5" (1.613 m)   Wt 233 lb (105.7 kg)   BMI 40.63 kg/m    VITAL SIGNS:  reviewed GEN:  no acute distress EYES:  sclerae anicteric, EOMI - Extraocular Movements Intact RESPIRATORY:  normal respiratory effort, symmetric expansion CARDIOVASCULAR:  no peripheral edema SKIN:  no rash, lesions  or ulcers. MUSCULOSKELETAL:  no obvious deformities. NEURO:  alert and oriented x 3, no obvious focal deficit PSYCH:  normal affect  ASSESSMENT & PLAN:    OSA - The pathophysiology of obstructive sleep apnea , it's cardiovascular consequences & modes of treatment including CPAP were discused with the patient in detail & they evidenced understanding.  The patient is tolerating PAP therapy well without any problems. The PAP download was reviewed today and showed an AHI of 4.2/hr on auto CPAP cm H2O with 87% compliance in using more than 4 hours nightly.  The patient has been using and benefiting from PAP use and will continue to benefit from therapy.  -I will decrease her auto CPAP to 4-15cm H2O since her AHI is low -check download in 2 weeks  2.  Morbid Obesity -I have encouraged her to get into a routine exercise program and cut back on carbs and portions.    Shared Decision Making/Informed Consent       COVID-19 Education: She is fully vaccinated  Time:   Today, I have spent 20 minutes with the patient with telehealth technology discussing the above problems.     Medication Adjustments/Labs and Tests Ordered: Current medicines are reviewed at length with the patient today.  Concerns regarding medicines are outlined above.   Tests Ordered: No orders of the defined types were placed in this encounter.   Medication Changes: No orders of the defined types were placed in this encounter.   Follow Up:  In Person in 1 year(s)  Signed, Fransico Him, MD  05/15/2020 9:20 AM    [place]

## 2020-05-16 ENCOUNTER — Other Ambulatory Visit: Payer: Self-pay

## 2020-05-16 ENCOUNTER — Encounter: Payer: Self-pay | Admitting: Internal Medicine

## 2020-05-16 ENCOUNTER — Ambulatory Visit (INDEPENDENT_AMBULATORY_CARE_PROVIDER_SITE_OTHER): Payer: BC Managed Care – PPO | Admitting: Internal Medicine

## 2020-05-16 VITALS — BP 98/54 | HR 76 | Ht 63.5 in | Wt 235.6 lb

## 2020-05-16 DIAGNOSIS — Z95810 Presence of automatic (implantable) cardiac defibrillator: Secondary | ICD-10-CM | POA: Diagnosis not present

## 2020-05-16 DIAGNOSIS — I428 Other cardiomyopathies: Secondary | ICD-10-CM | POA: Diagnosis not present

## 2020-05-16 DIAGNOSIS — I639 Cerebral infarction, unspecified: Secondary | ICD-10-CM

## 2020-05-16 DIAGNOSIS — I5022 Chronic systolic (congestive) heart failure: Secondary | ICD-10-CM

## 2020-05-16 LAB — CUP PACEART INCLINIC DEVICE CHECK
Battery Remaining Longevity: 58 mo
Battery Voltage: 2.97 V
Brady Statistic RV Percent Paced: 0.01 %
Date Time Interrogation Session: 20211124093048
HighPow Impedance: 75 Ohm
Implantable Lead Implant Date: 20150706
Implantable Lead Location: 753860
Implantable Pulse Generator Implant Date: 20150706
Lead Channel Impedance Value: 551 Ohm
Lead Channel Impedance Value: 646 Ohm
Lead Channel Pacing Threshold Amplitude: 0.5 V
Lead Channel Pacing Threshold Pulse Width: 0.4 ms
Lead Channel Sensing Intrinsic Amplitude: 8.4 mV
Lead Channel Setting Pacing Amplitude: 2 V
Lead Channel Setting Pacing Pulse Width: 0.4 ms
Lead Channel Setting Sensing Sensitivity: 0.3 mV

## 2020-05-16 NOTE — Progress Notes (Addendum)
Patient Care Team: [name], MD as PCP - General (Family Medicine) Larey Dresser, MD as PCP - Cardiology (Cardiology) Deboraha Sprang, MD as PCP - Electrophysiology (Cardiology) Sueanne Margarita, MD as PCP - Sleep Medicine (Cardiology)   HPI  [name] is a 49 y.o. 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  Question cardioembolic.    Underwent stenting.  On DAPT.  Follow-up scheduled 12/2.  Ambulating.  Minimal dyspnea.  No edema.  Dry cough.  No nausea.     DATE TEST EF   4/15 LHC (CE)  Cors w/out obstruction  8/15 Echo   20 %   10/19 Echo  20-25% MR mod  10/21 Echo  20-25%     Date PVCs    /2016 1%   2/20 11%--VTNS Monomorphic pop  VTNS -MM and PM   3/20 13.3% Sharma Covert morphic population 7.& 4 % Total   Date Cr K TSH LFTs Hgb  4/20 1.37 4.0 12.027 26     11/21 1.28 3.2 (adjusted) 3.1 14 9.6     Records and Results Reviewed   Past Medical History:  Diagnosis Date  . Automatic implantable cardioverter-defibrillator in situ   . Chronic CHF (congestive heart failure) (HCC)    a. EF 15-20% b. RHC (09/2013) RA 14, RV 57/22, PA 64/36 (48), PCWP 18, FIck CO/CI 3.7/1.6, PVR 8.1 WU, PA sat 47%   . History of stomach ulcers   . Hypertension   . Hypotension   . Hypothyroidism   . Morbid obesity ([place])   . Myocardial infarction ([place]) 08/2013  . Nocturnal dyspnea   . Nonischemic cardiomyopathy ([place])   . Sinus tachycardia   . Snoring-prob OSA 09/04/2011  . Sprint Fidelis ICD lead RECALL  [id number]   . UARS (upper airway resistance syndrome) 09/04/2011   HST 12/2013:  AHI 4/hr (numerous episodes of airflow reduction that did not have concomitant desaturation)     Past Surgical History:  Procedure Laterality Date  .  BREATH TEK H PYLORI N/A 11/09/2014   Procedure: BREATH TEK H PYLORI;  Surgeon: Greer Pickerel, MD;  Location: [facility];  Service: General;  Laterality: N/A;  . CARDIAC CATHETERIZATION  ~ 2006; 09/2013  . CARDIAC CATHETERIZATION N/A 05/23/2016   Procedure: Right Heart Cath;  Surgeon: Larey Dresser, MD;  Location: [facility];  Service: Cardiovascular;  Laterality: N/A;  . CARDIAC DEFIBRILLATOR PLACEMENT  2006; 12/26/2013   Medtronic [id number]; 12-2013 ICD gen change and RV lead revision with new 7654 RV lead by Dr [name]  . CESAREAN SECTION  1999  . IMPLANTABLE CARDIOVERTER DEFIBRILLATOR GENERATOR CHANGE N/A 12/26/2013   Procedure: IMPLANTABLE CARDIOVERTER DEFIBRILLATOR GENERATOR CHANGE;  Surgeon: Deboraha Sprang, MD;  Location: [facility];  Service: Cardiovascular;  Laterality: N/A;  . IR CT HEAD LTD  04/17/2020  . IR CT HEAD LTD  04/17/2020  . IR INTRA CRAN STENT  04/17/2020  . IR PERCUTANEOUS ART THROMBECTOMY/INFUSION INTRACRANIAL INC DIAG ANGIO  04/17/2020  . LEAD REVISION N/A 12/26/2013   Procedure: LEAD REVISION;  Surgeon: Deboraha Sprang, MD;  Location: [facility];  Service: Cardiovascular;  Laterality: N/A;  . RADIOLOGY WITH ANESTHESIA N/A 04/17/2020   Procedure: Code Stroke;  Surgeon: Luanne Bras, MD;  Location: [facility];  Service: Radiology;  Laterality: N/A;  . RIGHT HEART CATHETERIZATION N/A 02/15/2014   Procedure: RIGHT HEART CATH;  Surgeon: Larey Dresser, MD;  Location: [facility] CATH LAB;  Service: Cardiovascular;  Laterality: N/A;  . TUBAL LIGATION  1999    Current Meds  Medication Sig  . amiodarone (PACERONE) 200 MG tablet TAKE 1 TABLET BY MOUTH DAILY  . aspirin 81 MG chewable tablet Chew 1 tablet (81 mg total) by mouth daily.  Marland Kitchen atorvastatin (LIPITOR) 40 MG tablet Take 40 mg by mouth daily.  . carvedilol (COREG) 6.25 MG tablet Take 1 tablet (6.25 mg total) by mouth 2 (two) times daily with a meal.  . dapagliflozin propanediol (FARXIGA) 10 MG TABS tablet Take 10 mg by  mouth daily before breakfast.  . digoxin (LANOXIN) 0.125 MG tablet Take 0.5 tablets (0.0625 mg total) by mouth daily.  Marland Kitchen levothyroxine (SYNTHROID) 25 MCG tablet Take 1 tablet (25 mcg total) by mouth daily before breakfast.  . metolazone (ZAROXOLYN) 2.5 MG tablet Take 1 tablet (2.5 mg total) by mouth as needed (extra fluid as directed by HF clinic).  . potassium chloride SA (KLOR-CON) 20 MEQ tablet Take 3 tablets (60 mEq total) by mouth daily.  . sacubitril-valsartan (ENTRESTO) 49-51 MG Take 1 tablet by mouth 2 (two) times daily.  Marland Kitchen spironolactone (ALDACTONE) 25 MG tablet Take 25 mg by mouth daily.  . ticagrelor (BRILINTA) 90 MG TABS tablet Take 1 tablet (90 mg total) by mouth 2 (two) times daily.  Marland Kitchen torsemide (DEMADEX) 20 MG tablet Take 4 tablets (80 mg total) by mouth in the morning.  . Vitamin D, Ergocalciferol, (DRISDOL) 1.25 MG (50000 UNIT) CAPS capsule Take 1 capsule (50,000 Units total) by mouth every 7 (seven) days.    No Known Allergies    Review of Systems negative except from HPI and PMH  Physical Exam BP (!) 98/54   Pulse 76   Ht 5' 3.5" (1.613 m)   Wt 235 lb 9.6 oz (106.9 kg)   SpO2 99%   BMI 41.08 kg/m  Well developed and Morbidly obese  in no acute distress HENT normal E scleral and icterus clear Neck Supple JVP flat; carotids brisk and full Clear to ausculation Tegular rate and rhythm, no murmurs gallops or rub Soft with active bowel sounds No clubbing cyanosis  Edema Alert and oriented, grossly normal motor and sensory function Skin Warm and Dry  ECG sinus at 76 Intervals 18/13/40  Estimated Creatinine Clearance: 62.9 mL/min (A) (by C-G formula based on SCr of 1.28 mg/dL (H)).   Assessment and  Plan  Nonischemic cardiomyopathy  Systolic heart failure-chronic/acute   Implantable defibrillator-Medtronic The patient's device was interrogated.  The information was reviewed. No changes were made in the programming.     High Risk Medication  Surveillance --amio  6949-lead replaced   PVCs / VT NS  MR/TR  Stroke with intracranial stenting  Euvolemic continue current meds  No intercurrent Ventricular tachycardia  Tolerating amiodarone; however, and a little bit concerned about the cough which can be a marker for pulmonary lung toxicity.  We will undertake PFTs and plan follow-up in 6 months  Have reached out to neuroradiology concerning DAPT.      Current medicines are reviewed at length with the patient today .  The patient does not  have concerns regarding medicines.

## 2020-05-16 NOTE — Patient Instructions (Addendum)
Medication Instructions:  No changes *If you need a refill on your cardiac medications before your next appointment, please call your pharmacy*   Lab Work: none If you have labs (blood work) drawn today and your tests are completely normal, you will receive your results only by: Marland Kitchen MyChart Message (if you have MyChart) OR . A paper copy in the mail If you have any lab test that is abnormal or we need to change your treatment, we will call you to review the results.   Testing/Procedures: Your physician has recommended that you have a pulmonary function test. Pulmonary Function Tests are a group of tests that measure how well air moves in and out of your lungs.   Follow-Up: Remote monitoring is used to monitor your ICD from home. This monitoring reduces the number of office visits required to check your device to one time per year. It allows [name] to keep an eye on the functioning of your device to ensure it is working properly. You are scheduled for a device check from home on 05/28/21. You may send your transmission at any time that day. If you have a wireless device, the transmission will be sent automatically. After your physician reviews your transmission, you will receive a postcard with your next transmission date.    At [facility], you and your health needs are our priority.  As part of our continuing mission to provide you with exceptional heart care, we have created designated Provider Care Teams.  These Care Teams include your primary Cardiologist (physician) and Advanced Practice Providers (APPs -  Physician Assistants and Nurse Practitioners) who all work together to provide you with the care you need, when you need it.   Your next appointment:   6 month(s)  The format for your next appointment:   In Person  Provider:   Dr. [name]   Other Instructions

## 2020-05-21 ENCOUNTER — Other Ambulatory Visit: Payer: Self-pay

## 2020-05-21 ENCOUNTER — Ambulatory Visit (HOSPITAL_COMMUNITY)
Admission: RE | Admit: 2020-05-21 | Discharge: 2020-05-21 | Disposition: A | Discharge status: Home / Self Care | Payer: BC Managed Care – PPO | Source: Ambulatory Visit | Attending: Internal Medicine | Admitting: Internal Medicine

## 2020-05-21 DIAGNOSIS — I5022 Chronic systolic (congestive) heart failure: Secondary | ICD-10-CM | POA: Diagnosis not present

## 2020-05-21 LAB — BASIC METABOLIC PANEL
Anion gap: 10 (ref 5–15)
BUN: 11 mg/dL (ref 6–20)
CO2: 23 mmol/L (ref 22–32)
Calcium: 9.3 mg/dL (ref 8.9–10.3)
Chloride: 104 mmol/L (ref 98–111)
Creatinine, Ser: 1.2 mg/dL — ABNORMAL HIGH (ref 0.44–1.00)
GFR, Estimated: 55 mL/min — ABNORMAL LOW (ref >60)
Glucose, Bld: 104 mg/dL — ABNORMAL HIGH (ref 70–99)
Potassium: 4.1 mmol/L (ref 3.5–5.1)
Sodium: 137 mmol/L (ref 135–145)

## 2020-05-22 ENCOUNTER — Other Ambulatory Visit (HOSPITAL_COMMUNITY): Payer: Self-pay | Admitting: *Deleted

## 2020-05-22 MED ORDER — CARVEDILOL 6.25 MG PO TABS
6.2500 mg | ORAL_TABLET | Freq: Two times a day (BID) | ORAL | 0 refills | Status: DC
Start: 2020-05-22 — End: 2020-07-04

## 2020-05-22 MED ORDER — POTASSIUM CHLORIDE CRYS ER 20 MEQ PO TBCR
60.0000 meq | EXTENDED_RELEASE_TABLET | Freq: Every day | ORAL | 3 refills | Status: DC
Start: 2020-05-22 — End: 2020-07-23

## 2020-05-22 MED ORDER — TORSEMIDE 20 MG PO TABS
80.0000 mg | ORAL_TABLET | Freq: Every morning | ORAL | 0 refills | Status: DC
Start: 2020-05-22 — End: 2020-07-04

## 2020-05-24 ENCOUNTER — Telehealth: Payer: Self-pay | Admitting: *Deleted

## 2020-05-24 ENCOUNTER — Ambulatory Visit (HOSPITAL_COMMUNITY)
Admission: RE | Admit: 2020-05-24 | Discharge: 2020-05-24 | Disposition: A | Discharge status: Home / Self Care | Payer: BC Managed Care – PPO | Source: Ambulatory Visit | Attending: Student | Admitting: Student

## 2020-05-24 DIAGNOSIS — G4733 Obstructive sleep apnea (adult) (pediatric): Secondary | ICD-10-CM

## 2020-05-24 DIAGNOSIS — I6602 Occlusion and stenosis of left middle cerebral artery: Secondary | ICD-10-CM

## 2020-05-24 NOTE — Telephone Encounter (Signed)
-----   Message from Sueanne Margarita, MD sent at 05/15/2020  9:23 AM EST ----- Decrease auto CPAP to 4-15cm H2O and get download in 2 weeks.

## 2020-05-24 NOTE — Telephone Encounter (Signed)
Order placed to [employer] [facility] via community message. 

## 2020-06-01 NOTE — Progress Notes (Signed)
No ICM remote transmission received for 05/31/2020 and next ICM transmission scheduled for 06/19/2020.

## 2020-06-04 ENCOUNTER — Telehealth (HOSPITAL_COMMUNITY): Payer: Self-pay | Admitting: *Deleted

## 2020-06-04 ENCOUNTER — Ambulatory Visit (HOSPITAL_COMMUNITY)
Admission: RE | Admit: 2020-06-04 | Discharge: 2020-06-04 | Disposition: A | Discharge status: Home / Self Care | Payer: BC Managed Care – PPO | Source: Ambulatory Visit | Attending: Student | Admitting: Student

## 2020-06-04 ENCOUNTER — Other Ambulatory Visit: Payer: Self-pay

## 2020-06-04 DIAGNOSIS — I63512 Cerebral infarction due to unspecified occlusion or stenosis of left middle cerebral artery: Secondary | ICD-10-CM | POA: Diagnosis not present

## 2020-06-04 DIAGNOSIS — Z9889 Other specified postprocedural states: Secondary | ICD-10-CM | POA: Diagnosis not present

## 2020-06-04 DIAGNOSIS — R55 Syncope and collapse: Secondary | ICD-10-CM | POA: Diagnosis not present

## 2020-06-04 MED ORDER — ENTRESTO 24-26 MG PO TABS: 1.0000 | ORAL_TABLET | Freq: Two times a day (BID) | ORAL | 3 refills | Status: DC

## 2020-06-04 NOTE — Telephone Encounter (Signed)
Pt left VM stating she had a syncopal episode yesterday that lasted just a few seconds she thinks her bp is too low and might need her medications adjusted. Pt denies any injury from the fall.  Pt said that since increasing entresto she just feels her blood pressure is too low. Pt said her bp monitor at home at times won't register her bp because it is too low. I asked pt for bp reading and pt said she doesn't check it since her monitor normally wont give a reading.   Routed to [place] for advice

## 2020-06-04 NOTE — Telephone Encounter (Signed)
Hold Entresto for 1 day and decrease to 24/26 bid.

## 2020-06-04 NOTE — Telephone Encounter (Signed)
Pt aware and verbalized understanding. Medication list updated and new script sent to [facility].

## 2020-06-05 HISTORY — PX: IR RADIOLOGIST EVAL & MGMT: IMG5224

## 2020-06-13 DIAGNOSIS — G4733 Obstructive sleep apnea (adult) (pediatric): Secondary | ICD-10-CM | POA: Diagnosis not present

## 2020-06-18 ENCOUNTER — Ambulatory Visit (INDEPENDENT_AMBULATORY_CARE_PROVIDER_SITE_OTHER): Payer: BC Managed Care – PPO

## 2020-06-18 DIAGNOSIS — G4733 Obstructive sleep apnea (adult) (pediatric): Secondary | ICD-10-CM | POA: Diagnosis not present

## 2020-06-18 DIAGNOSIS — I428 Other cardiomyopathies: Secondary | ICD-10-CM | POA: Diagnosis not present

## 2020-06-18 LAB — CUP PACEART REMOTE DEVICE CHECK
Battery Remaining Longevity: 55 mo
Battery Voltage: 2.98 V
Brady Statistic RV Percent Paced: 0.01 %
Date Time Interrogation Session: 20211227012404
HighPow Impedance: 77 Ohm
Implantable Lead Implant Date: 20150706
Implantable Lead Location: 753860
Implantable Pulse Generator Implant Date: 20150706
Lead Channel Impedance Value: 551 Ohm
Lead Channel Impedance Value: 665 Ohm
Lead Channel Pacing Threshold Amplitude: 0.5 V
Lead Channel Pacing Threshold Pulse Width: 0.4 ms
Lead Channel Sensing Intrinsic Amplitude: 7.75 mV
Lead Channel Sensing Intrinsic Amplitude: 7.75 mV
Lead Channel Setting Pacing Amplitude: 2 V
Lead Channel Setting Pacing Pulse Width: 0.4 ms
Lead Channel Setting Sensing Sensitivity: 0.3 mV

## 2020-06-19 ENCOUNTER — Ambulatory Visit (INDEPENDENT_AMBULATORY_CARE_PROVIDER_SITE_OTHER): Payer: BC Managed Care – PPO

## 2020-06-19 DIAGNOSIS — Z4502 Encounter for adjustment and management of automatic implantable cardiac defibrillator: Secondary | ICD-10-CM

## 2020-06-19 DIAGNOSIS — I5022 Chronic systolic (congestive) heart failure: Secondary | ICD-10-CM | POA: Diagnosis not present

## 2020-06-19 DIAGNOSIS — Z95810 Presence of automatic (implantable) cardiac defibrillator: Secondary | ICD-10-CM

## 2020-06-20 NOTE — Progress Notes (Signed)
EPIC Encounter for ICM Monitoring  Patient Name: [name] is a 49 y.o. female Date: 06/20/2020 Primary Care Physican: Dorothey Baseman, MD Primary Cardiologist:[place] Electrophysiologist:Klein 05/16/2020 OfficeWeight: 235lbs  Spoke with patient and reports feeling well at this time.  Denies fluid symptoms.  Hospitalization 10/26 with dx of stroke.  OptivolThoracicimpedancenormal.  Prescribed:  Torsemide20 mgTake 4tablets(80 mg total)by mouth every morning.   Potassium 20 mEqTake3tablets (60 mEq total) by mouth daily  Spironolactone 25 mg take 1 tabletat bedtime.   Metolazone 2.5 mgTake 2.5 mg by mouth as needed (extra fluid as directed by HF clinic).  Labs: 05/21/2020 Creatinine 1.20, BUN 11, Potassium 4.1, Sodium 137, GFR 55 05/07/2020 Creatinine 1.28, BUN 9,   Potassium 3.2, Sodium 137, GFR 51  04/25/2020 Creatinine 1.30, BUN 16, Potassium 4.5, Sodium 137, GFR 50  04/20/2020 Creatinine 1.21, BUN 9,   Potassium 2.8, Sodium 143, GFR 55  04/18/2020 Creatinine 0.89, BUN 9,   Potassium 3.5, Sodium 142  A complete set of results can be found in Results Review.  Recommendations:No changes and encouraged to call if experiencing any fluid symptoms.  Follow-up plan: ICM clinic phone appointment on2/12/2020. 91 day device clinic remote transmission3/28/2022.   EP/Cardiology Office Visits:07/23/2020 with Dr Shirlee Latch.   Copy of ICM check sent to Dr.Klein   3 month ICM trend: 06/20/2020    1 Year ICM trend:       Karie Soda, RN 06/20/2020 3:52 PM

## 2020-06-28 NOTE — Progress Notes (Signed)
Remote ICD transmission.   

## 2020-07-04 ENCOUNTER — Other Ambulatory Visit (HOSPITAL_COMMUNITY): Payer: Self-pay | Admitting: Cardiology

## 2020-07-06 ENCOUNTER — Other Ambulatory Visit (HOSPITAL_COMMUNITY)
Admission: RE | Admit: 2020-07-06 | Discharge: 2020-07-06 | Disposition: A | Discharge status: Home / Self Care | Payer: BC Managed Care – PPO | Source: Ambulatory Visit | Attending: Internal Medicine | Admitting: Internal Medicine

## 2020-07-06 DIAGNOSIS — Z01812 Encounter for preprocedural laboratory examination: Secondary | ICD-10-CM | POA: Insufficient documentation

## 2020-07-06 DIAGNOSIS — Z20822 Contact with and (suspected) exposure to covid-19: Secondary | ICD-10-CM | POA: Insufficient documentation

## 2020-07-06 LAB — SARS CORONAVIRUS 2 (TAT 6-24 HRS): SARS Coronavirus 2: NEGATIVE

## 2020-07-11 ENCOUNTER — Other Ambulatory Visit: Payer: Self-pay

## 2020-07-11 ENCOUNTER — Ambulatory Visit (INDEPENDENT_AMBULATORY_CARE_PROVIDER_SITE_OTHER): Payer: BC Managed Care – PPO | Admitting: Internal Medicine

## 2020-07-11 DIAGNOSIS — Z95810 Presence of automatic (implantable) cardiac defibrillator: Secondary | ICD-10-CM

## 2020-07-11 DIAGNOSIS — I428 Other cardiomyopathies: Secondary | ICD-10-CM

## 2020-07-11 DIAGNOSIS — I5022 Chronic systolic (congestive) heart failure: Secondary | ICD-10-CM

## 2020-07-11 LAB — PULMONARY FUNCTION TEST
DL/VA % pred: 113 %
DL/VA: 4.89 ml/min/mmHg/L
DLCO cor % pred: 82 %
DLCO cor: 17.54 ml/min/mmHg
DLCO unc % pred: 82 %
DLCO unc: 17.54 ml/min/mmHg
FEF 25-75 Post: 3.05 L/sec
FEF 25-75 Pre: 2.79 L/sec
FEF2575-%Change-Post: 9 %
FEF2575-%Pred-Post: 122 %
FEF2575-%Pred-Pre: 112 %
FEV1-%Change-Post: 0 %
FEV1-%Pred-Post: 107 %
FEV1-%Pred-Pre: 106 %
FEV1-Post: 2.53 L
FEV1-Pre: 2.5 L
FEV1FVC-%Change-Post: 2 %
FEV1FVC-%Pred-Pre: 102 %
FEV6-%Change-Post: -1 %
FEV6-%Pred-Post: 104 %
FEV6-%Pred-Pre: 105 %
FEV6-Post: 2.96 L
FEV6-Pre: 3 L
FEV6FVC-%Pred-Post: 102 %
FEV6FVC-%Pred-Pre: 102 %
FVC-%Change-Post: -1 %
FVC-%Pred-Post: 101 %
FVC-%Pred-Pre: 102 %
FVC-Post: 2.96 L
FVC-Pre: 3 L
Post FEV1/FVC ratio: 85 %
Post FEV6/FVC ratio: 100 %
Pre FEV1/FVC ratio: 83 %
Pre FEV6/FVC Ratio: 100 %
RV % pred: -6 %
RV: -0.11 L
TLC % pred: 79 %
TLC: 4.01 L

## 2020-07-11 NOTE — Patient Instructions (Signed)
Full  pulmonary function test performed today. 

## 2020-07-11 NOTE — Progress Notes (Signed)
Full PFT performed today. °

## 2020-07-12 ENCOUNTER — Other Ambulatory Visit: Payer: Self-pay

## 2020-07-12 NOTE — Patient Outreach (Signed)
[name]) Care Management  07/12/2020  [name] Apr 17, 1971 [id number]   Telephone outreach to patient to obtain mRS was successfully completed. MRS= 3  Thank you, [place] Care Management Assistant

## 2020-07-13 ENCOUNTER — Telehealth: Payer: Self-pay

## 2020-07-13 NOTE — Telephone Encounter (Signed)
Spoke with pt and advised per Dr [name] Pulmonary study was fairly normal and DLCO, which is looked at for [facility] toxicity was was almost normal.  Pt reports she has improvement in her cough and declines a CT of the chest at this time.  Pt is scheduled to see Dr [name] on 01/31 and will see Dr [name] again 10/2020.  Pt verbalizes understanding and thanked Therapist, sports for the call.

## 2020-07-13 NOTE — Telephone Encounter (Signed)
-----   Message from Deboraha Sprang, MD sent at 07/11/2020 10:55 AM EST ----- Please Inform Patient that study was pretty normal and the DLCO which we look at for [facility] toxicity was almost normal  How is the cough?  If it still a problme, then we should do a high resolution CT of the chest   Thanks

## 2020-07-21 ENCOUNTER — Other Ambulatory Visit: Payer: Self-pay | Admitting: Internal Medicine

## 2020-07-23 ENCOUNTER — Other Ambulatory Visit: Payer: Self-pay

## 2020-07-23 ENCOUNTER — Encounter (HOSPITAL_COMMUNITY): Payer: Self-pay | Admitting: Cardiology

## 2020-07-23 ENCOUNTER — Ambulatory Visit (HOSPITAL_BASED_OUTPATIENT_CLINIC_OR_DEPARTMENT_OTHER)
Admission: RE | Admit: 2020-07-23 | Discharge: 2020-07-23 | Disposition: A | Discharge status: Home / Self Care | Payer: BC Managed Care – PPO | Source: Ambulatory Visit | Attending: Cardiology | Admitting: Cardiology

## 2020-07-23 ENCOUNTER — Ambulatory Visit (HOSPITAL_COMMUNITY)
Admission: RE | Admit: 2020-07-23 | Discharge: 2020-07-23 | Disposition: A | Discharge status: Home / Self Care | Payer: BC Managed Care – PPO | Source: Ambulatory Visit | Attending: Cardiology | Admitting: Cardiology

## 2020-07-23 ENCOUNTER — Encounter: Payer: BC Managed Care – PPO | Admitting: *Deleted

## 2020-07-23 ENCOUNTER — Encounter: Payer: Self-pay | Admitting: *Deleted

## 2020-07-23 VITALS — BP 108/60 | HR 80 | Ht 64.0 in | Wt 233.8 lb

## 2020-07-23 VITALS — BP 108/60 | HR 80 | Ht 64.17 in | Wt 233.0 lb

## 2020-07-23 DIAGNOSIS — I272 Pulmonary hypertension, unspecified: Secondary | ICD-10-CM | POA: Insufficient documentation

## 2020-07-23 DIAGNOSIS — E669 Obesity, unspecified: Secondary | ICD-10-CM | POA: Insufficient documentation

## 2020-07-23 DIAGNOSIS — I110 Hypertensive heart disease with heart failure: Secondary | ICD-10-CM | POA: Insufficient documentation

## 2020-07-23 DIAGNOSIS — I428 Other cardiomyopathies: Secondary | ICD-10-CM | POA: Insufficient documentation

## 2020-07-23 DIAGNOSIS — Z8673 Personal history of transient ischemic attack (TIA), and cerebral infarction without residual deficits: Secondary | ICD-10-CM | POA: Insufficient documentation

## 2020-07-23 DIAGNOSIS — E785 Hyperlipidemia, unspecified: Secondary | ICD-10-CM | POA: Diagnosis not present

## 2020-07-23 DIAGNOSIS — Z79899 Other long term (current) drug therapy: Secondary | ICD-10-CM | POA: Insufficient documentation

## 2020-07-23 DIAGNOSIS — Z7982 Long term (current) use of aspirin: Secondary | ICD-10-CM | POA: Insufficient documentation

## 2020-07-23 DIAGNOSIS — Z006 Encounter for examination for normal comparison and control in clinical research program: Secondary | ICD-10-CM | POA: Insufficient documentation

## 2020-07-23 DIAGNOSIS — I493 Ventricular premature depolarization: Secondary | ICD-10-CM | POA: Insufficient documentation

## 2020-07-23 DIAGNOSIS — Z95810 Presence of automatic (implantable) cardiac defibrillator: Secondary | ICD-10-CM | POA: Insufficient documentation

## 2020-07-23 DIAGNOSIS — I5022 Chronic systolic (congestive) heart failure: Secondary | ICD-10-CM | POA: Diagnosis not present

## 2020-07-23 DIAGNOSIS — E039 Hypothyroidism, unspecified: Secondary | ICD-10-CM

## 2020-07-23 DIAGNOSIS — Z6841 Body Mass Index (BMI) 40.0 and over, adult: Secondary | ICD-10-CM | POA: Diagnosis not present

## 2020-07-23 LAB — COMPREHENSIVE METABOLIC PANEL
ALT: 13 U/L (ref 0–44)
AST: 17 U/L (ref 15–41)
Albumin: 3.7 g/dL (ref 3.5–5.0)
Alkaline Phosphatase: 87 U/L (ref 38–126)
Anion gap: 13 (ref 5–15)
BUN: 18 mg/dL (ref 6–20)
CO2: 25 mmol/L (ref 22–32)
Calcium: 9.4 mg/dL (ref 8.9–10.3)
Chloride: 96 mmol/L — ABNORMAL LOW (ref 98–111)
Creatinine, Ser: 1.67 mg/dL — ABNORMAL HIGH (ref 0.44–1.00)
GFR, Estimated: 37 mL/min — ABNORMAL LOW (ref >60)
Glucose, Bld: 100 mg/dL — ABNORMAL HIGH (ref 70–99)
Potassium: 3 mmol/L — ABNORMAL LOW (ref 3.5–5.1)
Sodium: 134 mmol/L — ABNORMAL LOW (ref 135–145)
Total Bilirubin: 0.6 mg/dL (ref 0.3–1.2)
Total Protein: 8.2 g/dL — ABNORMAL HIGH (ref 6.5–8.1)

## 2020-07-23 LAB — TSH: TSH: 10.272 u[IU]/mL — ABNORMAL HIGH (ref 0.350–4.500)

## 2020-07-23 LAB — DIGOXIN LEVEL: Digoxin Level: 0.7 ng/mL — ABNORMAL LOW (ref 0.8–2.0)

## 2020-07-23 NOTE — Research (Signed)
[employer] Informed Consent   Subject Name: [name]  Subject met inclusion and exclusion criteria.  The informed consent form, study requirements and expectations were reviewed with the subject and questions and concerns were addressed prior to the signing of the consent form.  The subject verbalized understanding of the trial requirements.  The subject agreed to participate in the [facility] trial and signed the informed consent at 07/23/2020.  The informed consent was obtained prior to performance of any protocol-specific procedures for the subject.  A copy of the signed informed consent was given to the subject and a copy was placed in the subject's medical record.   Philemon Kingdom D

## 2020-07-23 NOTE — Research (Signed)
[place] screening!  Section A:  Administrative Section  Subject ID: _[id number]_ - _016_ - 015 Subject Initials: _A_ _M_ _B__  Date subject signed informed consent  _31_/_Jan_/_2022_   (DD/  MMM / [place])  Has the subject been previously screened?    _0  No     _1   Yes                                                         Previous Subject ID _1 _2 _4 _5_ - __ __ __ - 015   Section B:  Enrollment Criteria  In the investigator's opinion does the subject meet the FDA Indication for Use:  _2  Yes    _3  No (STOP - subject does not qualify for the study)  Has the subject been previously, or are currently, randomized in the CVRx BeAT-HF Trial? _4  Yes (STOP - subject does not qualify for the study)   _5  No  Has the subject received cardiac resynchronization therapy (CRT) within six months of enrollment, or is actively receiving CRT? _6  Yes (STOP - subject does not qualify for the study)   _7  No  Section C:  Serum Biomarkers   NT-proBNP: _312_ Date:    _31_/_Jan_/_2022_                  (DD / MMM / YYYY)  eGFR:  _37_ mL/min/1.17m Date:    _31_/_Jan_/_2022_                  (DD / MMM / [place]  Negative Pregnancy Test? _8  N/A Date:    _____/_______/_______                  (DD / MMM / YYYY)   _9  Yes     _10  No   Section D:  Appropriate Surgical/Study Candidate:   Is the subject able to discontinue the use of antiplatelet drugs (e.g. aspirin) in advance of the procedure, if required? _11  Yes   _12  No  Assessed by:   _Annamarie Major MD______ Implanting Physician _13  Yes Date:    _03_/_Feb_/_2022_                 (DD / MMM / YYYY)   _14  No   Section E:  Inclusion/Exclusion Criteria  Does the subject meet all Inclusion Criteria? _15  Yes   _16  No (STOP - subject does not qualify for the study)   Check all that apply   _17  1. Age at least 23years and no more than 80 years at the time of enrollment.   _18  2. Appropriate candidate for the surgery as determined by an evaluation from the implanting  physician using a carotid duplex ultrasound (CDU) [or Computed Tomography Angiography (CTA) if CDU inconclusive or incomplete] and a review of medical history (including existence of infections that may increase implant risk).  Evaluation must confirm the following within 45 days of the BAROSTIM NEO implant: . Appropriate medical condition and medical history for implantation of the BAROSTIM NEO System AND . Anatomy that enables this implant procedure, with no vascular structures or orientations or neck anomalies that would be obstructive to the implantation path AND . The artery planned for the BAROSTIM NEO implant must have: o A carotid bifurcation below the level of the mandible AND o No  ulcerative carotid arterial plaques AND o No carotid atherosclerosis producing a 30% or greater stenosis in linear diameter in the internal carotid AND o No carotid atherosclerosis producing a 30% or greater stenosis in linear diameter in the distal common carotid AND . Have had no prior surgery, radiation, or endovascular stent placement in the carotid artery or the carotid sinus region AND . Able to discontinue the use of antiplatelet drugs (e.g. aspirin) in advance of the procedure, if required   _0  3. Six-minute hall walk (6MHW) ? 150 m AND ? 400 m within 45 days prior to implant.   _1  4. Serum estimated glomerular filtration rate (eGFR) ? 25 mL/min/1.73 m2 using the CKD-EPI method within 45 days prior to the [facility] NEO implant.   _2  5. Body mass index ? 40 kg/m2 within 45 days prior to the [facility] NEO implant.   _3  6. If female and of childbearing potential, must use a medically accepted method of birth control (e.g., barrier method with spermicide, oral contraceptive, or abstinence) and agree to continue use of this method for the duration of the study. Women of childbearing potential must have a negative pregnancy test within 14 days prior to the [facility] NEO implant.   _4  7. Subjects implanted with a  cardiac rhythm management device that does not utilize an intracardiac lead, or implanted with a neurostimulation device, must be approved by the [place].   _5  8. At the end of screening/baseline, subject still meets the Barostim Neo Indication for Use   _6  9. Signed a CVRx-approved informed consent form for participation in this study.      Does the subject meet any Exclusion Criteria? _7  Yes (STOP - subject does not qualify for the study)   _8  No     List criteria # met: __________________________   _9  1. Previously or currently randomized in the CVRx BeAT-HF Trial.   _10  2. Received cardiac resynchronization therapy (CRT) within six months of enrollment or is actively receiving CRT.   _11  3. Any of the following contraindications: . Baroreflex failure or autonomic neuropathy  . Uncontrolled, symptomatic cardiac bradyarrhythmias . Known allergy to silicone or titanium   _12  4. Unstable ventricular arrhythmias.   _13  5. Presence of baseline cranial nerve dysfunction at risk from cervical interventions on the carotid bifurcation determined by the Ear, Nose and Throat (ENT) examination.   _14  6. Subjects with any surgery that has occurred, or is planned to occur, within 45 days of the [facility] NEO implant.   _15  7. Recent history (within 6 months of implant) of significant and uncontrolled bleeding.   _16  8. Known and untreated hypercoagulability state.   _17  9. An inappropriate study candidate as evidenced by: Marland Kitchen Solid organ or hematologic transplant, or currently being evaluated for an organ transplant. . Has received or is receiving LVAD therapy or chronic dialysis. . Current or planned treatment with intravenous positive inotrope therapy. . Primary pulmonary hypertension. . Severe COPD or severe restrictive lung disease (e.g. requires chronic oral steroid use or home oxygen use). .  Heart failure secondary to a reversible cause, such as cardiac structural valvular disease, acute  myocarditis and pericardial constriction. . Clinically significant cardiac structural valvular disease. .  Unable or unwilling to fulfill the protocol medication compliance and follow-up requirements, for reasons including but not limited to an unresolved history of alcohol or substance abuse or psychiatric disorder. . Active malignancy. .  Any other serious medical condition that may adversely affect the safety  of the participant or validity of the study, in the opinion of the investigator. . Life expectancy less than one year.   _0  10. Any of the following within 3 months prior to the [facility] NEO implant. . Myocardial infarction . Unstable angina . Coronary intervention (e.g. CABG or PTCA)  . Cerebral vascular accident or transient ischemic attack . Sudden cardiac death  . Surgical cardiac intervention (e.g. cardiac ablation, valve replacement)   _1  11. Enrolled and active in another (e.g. device, pharmaceutical, or biological) clinical study unless approved by the [place].  Section F:  Adverse Events  Were there any Adverse Events that occurred since consent? _2  Yes (complete AE form)   _3  No  Section H:  Medication Changes   Have there been any changes to the subject's home use medications for Arrhythmia, Antiplatelet/Anticoagulation and Heart failure medications during screening and baseline? _4  Yes (update Med form)   _5  No  Section I:  Signature   Person completing form (Print) Name: Philemon Kingdom :) ______________  Signature: Philemon Kingdom :) _____ Date: _02/03/2022______________    Section A:  Administrative Section  Subject ID: _[id number]_ - _016_ - 015 Subject Initials: _A_ _M_ _B_  Visit Interval:    _6  Screening/Baseline    _7  Activation   _8  0.5 Month       _9  1 Month     _10  2 Month     _11  3 Month       _12  6 Month     _13  12 Month         _14  Unscheduled, reason for visit: _________________________________________  Section B:  Physical  Assessment   Date:    _02_/_Feb_/_2022_   (DD / MMM / YYYY)  Weight: _233_  _15  kg    _16  pounds Height (Screening Visit Only): _163_  _17  cm _18  inches  Blood Pressure: _108_  / _60_mmHg Heart Rate: _80_ bpm  Section E:  Signature     Person completing form (Print Name): Philemon Kingdom :) ______   Signature: Philemon Kingdom :) _____ Date: __01/31/2022________________________    SECTION A:  Administrative Section  Patient ID: _[id number]_ - _016_ - 015 Patient Initials: _A_ _M_ _B_  Visit Interval:    _19  Screening/Baseline*    _20  Activation   _21  0.5 Month       _22  1 Month     _23  2 Month     _24  3 Month       _25  6 Month     _26  12 Month         _27  Unscheduled, reason for visit: _________________________________________  * For Screening / Baseline only the questions are based on 30 days prior to consent.  SECTION B:  COVID-19 Like Illness Symptoms   Has the subject experienced any cold, flu or COVID-19 symptoms since the last study visit? _28  No  (skip to section C)     _29  Yes, date of onset:  _____/_____ MMM/YYYY    If yes, check all symptoms that apply:   _30  Fevers or chills   _31  New or Worsening Cough  _32  Productive  _33  Dry    _34  If yes to cough, indicate severity  _35  Constant  _36  Occasional, several per hour   _37  New or worsened shortness of breath   _38  Diarrhea   _39  Altered or reduced sense of smell or taste   _40  Muscle aches/Severe Fatigue   _41  Chest pain or tightness   _42  Sore throat   _43   Nausea or vomiting  SECTION C:  COVID-19 Like Illness Testing   Has the patient been tested for COVID-19 since the last study visit? _0  No     _1  Yes, date of test:  _14_/_Jan_/_2022_ MMM/YYYY    If yes, test results:   _2  Positive _3  Negative _4  Unknown  Has the patient been tested for COVID-19 Antibodies since the last study visit? _5  No     _6  Yes, date of test:  _____/_____ MMM/YYYY    If yes, test results:   _7  Positive _8  Negative _9  Unknown  Has the patient been  vaccinated for COVID-19 since the last study visit? _10  No     _11  Yes, date of test:  _12_/_Apr_/_2021____ MMM/YYYY  Has the patient been tested for Influenza ("flu") since the last study visit? _12  No     _13  Yes, date of test:  _____/_____ MMM/YYYY    If yes, test results:   _14  Positive _15  Negative _16  Unknown  Has the patient been vaccinated for Influenza ("flu") since the last study visit? _17  No     _18  Yes, date of test:  _____/_____ MMM/YYYY  SECTION D:  COVID-19 Like Illness Exposure  Has the subject been told that they might have had COVID-19/have symptoms suggestive of COVID-19 since the last study visit? _19  No   _20  Yes   _21  Unknown  Has the subject been exposed to anyone with known or suspected COVID-19 since the last study visit? _22  No   _23  Yes   _24  Unknown  Has the subject been told that they might have had the "flu" or influenza since the last study visit? _25  No   _26  Yes   _27  Unknown  SECTION E:  Effects of COVID Pandemic on Subject's Healthcare Interactions  Since the last study visit, did the subject feel the need to go to an emergency department or hospital for their heart failure but decided not to because of concerns about COVID-19? _28  No   _29  Yes   _30  Unknown    If yes, how did they seek care (check all that apply):   _31  Telemedicine visit _32  In-person _33  Clinic _34  Urgent Care   _35  Subject did not change hospital/ER use due to COVID-19 _36  Other, specify: ________________________  Since the last study visit, did the subject have a cardiology/HF related appointment cancelled/rescheduled due to COVID-19 pandemic? _37  No   _38  Yes, how many? ___________   _39  Unknown  Since the last study visit, did the subject have any cardiology/HF related telemedicine visit due to COVID-19 pandemic? _40  No   _41  Yes, how many? ___________   _42  Unknown  Since the last study visit, did the subject have a cardiology/HF procedure cancelled/rescheduled due to COVID-19 pandemic? _43  No    _44  Yes, how many? ___________   _45  Unknown  SECTION F:  Effects of COVID Pandemic on Subject's Medications  Since the last visit, did any of their heart failure medications change or stop, even for a short time?   If yes, enter change into medication eCRF _46  No   _47  Yes, how many? ___________   _48  Unknown    If yes, why:   _49  Instructed by Doctor _50  Self-discontinued _51  Unknown  Other: ________________  Since the last visit, was the subject prescribed any medications for COVID-19? _52  No   _53  Yes   _54  Unknown    If yes, what medications (generic name):  SECTION G:  Effects of COVID Pandemic on Subject's  Lifestyle  How has the subject's activity/exercise level changed due to COVID-19 pandemic? _0  No change   _1  More activity/exercise   _2  Less activity/exercise  How has the subject's smoking habits changed due to COVID-19? _3  Does not smoke   _4  No change   _5  Smoke more   _6  Smoke less  How has the subject's alcohol drinking habits changed due to COVID-19? _7  Does not drink   _8  No change   _9  Drink more   _10  Drink less  Section H:  Signature    Person completing form (Print Name): __Kimberly Alba Destine :) _______  Signature: Philemon Kingdom :) ______ Date: __01/31/2022________    Six Minute Hall Walk Test Date:    _31_/_Jan_/_2022_  (DD / MMM / [place])  Distance Walked _350_ meters  Were there any devices used to assist the subject in walking (e.g. cane, walker, etc.)? _11  No   _12  Yes specify:_______________________  Was the walk terminated before 6 minutes? _13  No   _14  Yes specify reason: (select all that apply)    _15  Angina    _16   Dyspnea    _17   Fatigue    _18   Dizziness    _19   Syncope    _20   Other, specify:  Name of person conducting 6-Minute [place] Walk: Philemon Kingdom :) ________   Page 1 of 1     Confidential   Form Protocol [phone] Rev. B dated 30-Mar-2019   Effective Date: 12-May-2019  Subject ID: _[id number]_ - _016_ - 015 Subject Initials: _A_ _M_  _B_ Visit Interval:  _21   Baseline     _22  6 Month  Section B:  NYHA   NYHA Classification Date:    _31_/_Jan_/_2022_  (DD / MMM / [place])  Select One Class Subject Symptoms  _23  I No limitations of physical activity, No undue fatigue, palpitation or dyspnea  _24  II Slight limitation of physical activity, Comfortable at rest, Less than ordinary activity results in fatigue, Palpitation, or dyspnea  _25  III Marked limitation of physical activity, Comfortable at rest, Less than ordinary activity results in fatigue, palpitation, or dyspnea  _26  IV Unable to carry out any physical activity without discomfort, Symptoms of cardiac insufficiency at rest, Physical activity causes increased discomfort  Name of person conducting NYHA: Philemon Kingdom :) ____   Patient ID: _[id number]_ - _016_ - 015 Patient Initials: _A_ _M_ _B_ Visit Interval:  _27   Baseline     _28  6 Month  Section D:  MLWHF   [place] Living With Heart Failure Questionnaire Date:    _31_/_Jan_/_2022_  (DD / MMM / YYYY)  These questions concern how your heart failure (heart condition) has prevented you from living as you wanted during the last month.  These items listed below describe different ways some people are affected.  If you are sure an item does not apply to you or is not related to your heart failure then circle 0 (no) and go on to the next item.  If an item does apply to you, then circle the number rating how much it prevented you from living as you wanted.  Did your heart failure prevent you from living as you wanted during the last month by:   No Very little    Very much  1. Causing swelling in your ankles, legs, etc.? 0_29  1_30  2_31  3_32  4_33  5_34   2. Making you sit or lie down to rest during the day? 0_35  1_36  2_37  3_38  4_39  5_40   3. Making your walking about  or climbing stairs difficult? 0_0  1_1  2_2  3_3  4_4  5_5   4. Making your working around the house or yard difficult? 0_6  1_7  2_8  3_9  4_10  5_11   5. Making your going places away from  home difficult? 0_12  1_13  2_14  3_15  4_16  5_17   6. Making your sleeping well at night difficult? 0_18  1_19  2_20  3_21  4_22  5_23   7. Making your relating to or doing things with your friends or family difficult? 0_24  1_25  2_26   3_27  4_28  5_29   8. Making your working to earn a living difficult? 0_30  1_31  2_32  3_33  4_34  5_35   9. Making your recreational pastimes, sports or hobbies difficult? 0_36  1_37  2_38  3_39  4_40  5_41   10. Making your sexual activities difficult? 0_42  1_43  2_44  3_45  4_46  5_47   11. Making you eat less of the foods you like? 0_48  1_49  2_50  3_51  4_52  5_53   12. Making you short of breath? 0_54  1_55  2_56  3_57  4_58  5_59   13. Making you tired, fatigued, or low on energy? 0_60  1_61  2_62  3_63  4_64  5_65   14. Making you stay in a hospital? 0_66  1_67  2_68  3_69  4_70  5_71   15. Costing you money for medical care? 0_72  1_73  2_74  3_75  4_76  5_77   16. Giving you side effects from medications? 0_78  1_79  2_80  3_81  4_82  5_83   17. Making you feel you are a burden to your family or friends? 0_84  1_85  2_86  3_87  4_88  5_89   18. Making you feel a loss of self-control in your life? 0_90  1_91  2_92  3_93  4_94  5_95   19. Making you worry? 0_96  1_97  2_98  3_99  4_100  5_101   20. Making it difficult for you to concentrate or remember things? 0_102  1_103  2_104  3_105  4_106  5_107   21 Making you feel depressed? 0_108  1_109  2_110  3_111  4_112  5_113   Section E:  Signature Section  Person Administering Questionnaire Name: (person who read first question and handed questionnaire to subject) Philemon Kingdom :) ______ (Print) 07/23/2020   Philemon Kingdom :) ___ (Sign) (Date)  Person Completing Questionnaire Name: (e.g. subject, person reading questionnaire, etc.) Philemon Kingdom :) _______ (Print) 07/23/2020   Philemon Kingdom :) _____ (Sign) (Date)   Screen fail for patient due to high bifurcation on both sides.

## 2020-07-23 NOTE — Progress Notes (Signed)
ID:  [name], DOB 1970/10/15, MRN [id number]    Provider location: [facility] Type of Visit: Established patient   PCP:  [name], MD    Cardiologist:  [name], MD  History of Present Illness: [name] is a 50 y.o. 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  She was followed for systolic CHF initially in [place]. Apparently her EF recovered to 35-40%. However, she was admitted to [facility] 09/19/13 for worsening dyspnea and CP. Coronaries were reportedly OK on LHC at that time at [facility]. Echo showed EF of 15-20% with four-chamber enlargement with moderate-severe TR/MR. RHC as below. Rheumatological w/u negative.  CPX in 9/16 showed near-normal functional capacity.  Repeat echo in 6/17 shows that EF remains 20%. Repeat CPX in 1/18 was submaximal but probably only mild circulatory limitation.  Echo 8/18 showed EF 25-30%, severe LV dilation.  CPX in 6/19 showed low normal functional capacity.  Echo in 10/19 showed EF 20-25%, moderate LV dilation.   Zio patch in 2/20 with 11% PVCs and NSVT, amiodarone started. Zio patch in 3/20 with 13.3% PVCs and NSVT.  She saw Dr. [name], he thought that increased PVCs were electromechanical in nature due to worsening heart failure.  Repeat Zio patch on amiodarone in 1/21 showed 6.2% PVCs.   She was admitted in 4/20 with VT in setting of hypokalemia.    CPX in 9/20 showed mild functional limitation, more due to body habitus than heart failure.  Echo in 4/21 showed EF 20% with severe LV dilation, diffuse hypokinesis, normal RV size and systolic function.   In 8/21, she had a shock for VF in the setting of hypokalemia.   Patient was admitted in 10/21 with CVA involving left MCA territory.  This was thought to be cardioembolic but LV thrombus was not visualized and no atrial fibrillation has been seen.  Echo  in 10/21 showed EF 20-25%, severe LV dilation (no LV thrombus seen), mildly decreased RV systolic function, mild-moderate MR. She was taken by IR for thrombectomy and stent placement, now on ASA 81 and ticagrelor.   She returns for followup of CHF.  She is no longer coughing but reports increased dyspnea compared to the past.  She is short of breath getting dressed or walking more than about 50 feet.  She has chronic 3 pillow orthopnea.  No chest pain. Occasional lightheadedness with standing => she had to decrease Entresto to lowest dose due to lightheadedness and a syncopal episode.  Only residual from CVA is mild right leg tingling.  She is using CPAP.   Medtronic device interrogation: Fluid index < threshold, no VT, minimal activity.    REDS clip 36%.   PMH: 1. OSA: Using CPAP.  2. PVCs:  - Zio patch 2/20: 11% PVCs - Zio patch 3/20: 13.3% PVCs - Zio patch 1/21: 6.2% PVCs 3. VF: 8/21, in setting of hypokalemia.  4. 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  No LV thrombus visualized and atrial fibrillation has not been visualized.  - IR thrombectomy with stent placement in 10/21.  5. Chronic systolic CHF: Nonischemic cardiomyopathy:   - Coronary angiography 2015 at [facility]: Normal coronaries.  - Echo (8/15) with EF 20%, moderate LV dilation, diffuse hypokinesis, normal RV size with mildly decreased systolic function, mild MR.  - Echo (5/16) with EF 20%, spherical LV with diffuse hypokinesis, mild  MR.  - Echo (6/17) with EF 20%, diffuse hypokinesis, moderate LV dilation, normal RV, mild to moderate MR.  - RHC (12/17): mean RA 5, PA 56/31, mean PCWP 24, CI 2, PVR 3.7 WU - Echo (8/18) with EF 25-30%, severe LV dilation, severe LAE.  - Echo (2/20) with EF 20-25%, moderate LV dilation, moderate MR.  - Echo (4/21) with EF 20-25%, severe LV dilation, mild-moderate MR, normal RV, PASP 26 mmHg.  - Echo (10/21) with EF 20-25%, severe LV dilation (no LV thrombus seen), mildly decreased RV systolic function,  mild-moderate MR. - CPX in 9/20 showed mild functional limitation, more due to body habitus than heart failure.  - CPX in 8/21: Peak VO2 11.6, VE/VCO2 32, RER 1.2.  Mild HF limitation.  6. PFTs (1/22): Near normal.   Labs: (11/09/13): Dig level 0.5, K 3.6, Creatinine 0.83, pro-BNP 622 (12/19/13): K 3.6, creatinine 0.8 (7/15): K 4.1, creatinine 0.91 (8/15): K 3.9, creatinine 0.91 (10/15): K 4, creatinine 0.93, proBNP 129 (12/15): K 4, creatinine 0.9, digoxin 0.4 (5/16): K 4, creatinine 0.86 (9/16): K 3.6, creatinine 0.86, digoxin < 0.2 (5/17): K 4.1, creatinine 0.95, digoxin 0.3 (5/18): K 3.4, creatinine 0.95, TSH elevated but free T3 and free T4 normal.  (8/18): K 3.9, creatinine 0.87, BNP 108  (11/18): K 3.5, creatinine 1.04, digoxin 0.4 (1/19): LDL 91, HDL 47 (3/19): digoxin 0.7, K 4, creatinine 0.99 (4/19): TSH normal, LDL 95 (9/19): LDL 99, HDL 45, K 4, creatinine 0.96 (10/19): K 3.6, creatinine 0.85, digoxin 0.5 (2/20): LDL 103 (3/20): K 3.9, creatinine 1.05 (4/20): K 3.9, creatinine 1.23 => 1.15, TSH 8, digoxin 0.3, LFTs normal 6/20): K 4.3, creatinine 1.07, LFTs normal, digoxin 0.5 (7/20): K 3.8, creatinine 1.16 (8/20): digoxin < 0.2, LDL 135 (1/21): K 3.9, creatinine 1.05, LDL 171  (4/21): K 4.2, creatinine 0.95, LDL 127, TSH normal, LFTs normal (10/21): K 2.8, creatinine 1.21, LDL 130 (11/21): K 4.5, creatinine 1.3 => 1.2, LFTs normal, digoxin 1.3, TSH normal  Current Outpatient Medications  Medication Sig Dispense Refill  . amiodarone (PACERONE) 200 MG tablet TAKE 1 TABLET BY MOUTH DAILY 90 tablet 3  . aspirin 81 MG chewable tablet Chew 1 tablet (81 mg total) by mouth daily. 30 tablet 11  . atorvastatin (LIPITOR) 40 MG tablet Take 40 mg by mouth daily.    . carvedilol (COREG) 6.25 MG tablet TAKE 1 TABLET BY MOUTH TWICE A DAY WITH A MEAL. 60 tablet 0  . dapagliflozin propanediol (FARXIGA) 10 MG TABS tablet Take 10 mg by mouth daily before breakfast. 30 tablet 5  .  digoxin (LANOXIN) 0.125 MG tablet Take 0.5 tablets (0.0625 mg total) by mouth daily. 30 tablet 11  . levothyroxine (SYNTHROID) 25 MCG tablet Take 1 tablet (25 mcg total) by mouth daily before breakfast. 90 tablet 3  . metolazone (ZAROXOLYN) 2.5 MG tablet Take 1 tablet (2.5 mg total) by mouth as needed (extra fluid as directed by HF clinic). 5 tablet 3  . potassium chloride SA (KLOR-CON) 20 MEQ tablet Take 20 mEq by mouth 2 (two) times daily.    . sacubitril-valsartan (ENTRESTO) 24-26 MG Take 1 tablet by mouth 2 (two) times daily. 60 tablet 3  . spironolactone (ALDACTONE) 25 MG tablet Take 25 mg by mouth daily.    . ticagrelor (BRILINTA) 90 MG TABS tablet Take 1 tablet (90 mg total) by mouth 2 (two) times daily. 60 tablet 11  . torsemide (DEMADEX) 20 MG tablet TAKE 4 TABLETS (80 MG) BY MOUTH IN  THE MORNING. 90 tablet 0  . Vitamin D, Ergocalciferol, (DRISDOL) 1.25 MG (50000 UNIT) CAPS capsule Take 1 capsule (50,000 Units total) by mouth every 7 (seven) days. 4 capsule 0   No current facility-administered medications for this encounter.    Allergies:   Patient has no known allergies.   Social History:  The patient  reports that she has never smoked. She has never used smokeless tobacco. She reports that she does not drink alcohol and does not use drugs.   Family History:  The patient's family history includes Heart disease in her maternal grandmother and mother; High blood pressure in her father; Obesity in her mother; Stroke in her father.   ROS:  Please see the history of present illness.   All other systems are personally reviewed and negative.   Exam:   BP 108/60   Pulse 80   Ht 5\' 4"  (1.626 m)   Wt 106.1 kg (233 lb 12.8 oz)   SpO2 99%   BMI 40.13 kg/m  General: NAD, obese.  Neck: No JVD, no thyromegaly or thyroid nodule.  Lungs: Clear to auscultation bilaterally with normal respiratory effort. CV: Nondisplaced PMI.  Heart regular S1/S2, no S3/S4, no murmur.  No peripheral edema.  No  carotid bruit.  Normal pedal pulses.  Abdomen: Soft, nontender, no hepatosplenomegaly, no distention.  Skin: Intact without lesions or rashes.  Neurologic: Alert and oriented x 3.  Psych: Normal affect. Extremities: No clubbing or cyanosis.  HEENT: Normal.   Recent Labs: 01/26/2020: B Natriuretic Peptide 929.8 04/18/2020: Hemoglobin 9.6; Platelets 294 04/20/2020: Magnesium 1.9 07/23/2020: ALT 13; BUN 18; Creatinine, Ser 1.67; Potassium 3.0; Sodium 134; TSH 10.272  Personally reviewed   Wt Readings from Last 3 Encounters:  07/23/20 106.1 kg (233 lb 12.8 oz)  05/16/20 106.9 kg (235 lb 9.6 oz)  05/15/20 105.7 kg (233 lb)      ASSESSMENT AND PLAN:  1. Chronic systolic HF: Nonischemic cardiomyopathy.  She has a Medtronic ICD. Cause for cardiomyopathy is uncertain, initially identified peri-partum (after son's birth), so cannot rule out peri-partum CMP.  On and off, she has had frequent PVCs.   RHC in 8/15 showed normal filling pressures and preserved cardiac output.  She does not qualify for CRT with relatively narrow QRS.  CPX 9/20 with mildly decreased functional capacity due to body habitus and HF.  CPX in 8/21 also showed mild functional limitation.  Echo in 10/21 showed stable EF 20-25% with severe LV dilation, mildly decreased RV systolic function, and mild-moderate MR. NYHA class III symptoms but she does not look volume overloaded on exam and thoracic impedance stable on Medtronic device though REDS clip borderline elevated.  Suspect that some of her exercise limitation is due to deconditioning post-CVA, she has been doing very little. She has had orthostatic symptoms including syncope, now doing better on low dose Entresto.   - Continue torsemide 80 mg daily, dose appears adequate.    - Continue Entresto 24/26 bid, did not tolerate higher dose due to orthostatic symptoms and syncope.  - She is on a lower Coreg dose 6.25 mg bid.  Given history of chronotropic incompetence, will leave at  this dose for now.   - Continue spironolactone 25 mg daily.  - She is on digoxin 0.0625.  Check level today.   - Continue dapagliflozin 10 mg daily.  - EF remains low, if she worsens clinically would be candidate for LVAD.  BMI too high currently for transplant.   - I had  her evaluated for baroreceptor activation therapy (BATwire).  I think she would be a good candidate => we will start evaluation. - Refer for cardiac rehab to [facility].  Needs more activity.     2. Pulmonary HTN: Mixed picture with PVR 8.1 on RHC at [facility].  However, repeat study in 8/15 here showed no significant pulmonary arterial hypertension.  2017 RHC with pulmonary venous hypertension.  3. Morbid obesity: Continue to work on weight loss.  She is going to work aggressively on diet and increase her exercise level.  4. Hyperlipidemia: Continue atorvastatin.  5. PVCs: 11% PVCs + NSVT on 2/20 Zio patch.  Amiodarone started, but PVCs up to 13.3% with NSVT on 3/20 Zio patch. She saw Dr. [name] who thought that the ventricular arrhythmias were electromechanical due to worsening HF.  In 4/20, she had VT x 2 with ICD discharges in the setting of hypokalemia. Zio patch in 1/21 with PVCs down to 6.2% of beats.  - Continue amiodarone, check LFTs and TSH.  She will need a regular eye exam with amiodarone use.  Recent PFTs were near normal.  6. CVA: CVA in 10/21, had thrombectomy with stent placement.  No LV thrombus noted and no atrial fibrillation noted.   - To continue ticagrelor x 6 months.  - Repeat echo at followup, reassess for LV thrombus.   Recommended follow-up:  3 months with echo.   Signed, [name], MD  07/23/2020  [facility] [address] [facility] [place] [address] [phone] (office) [phone] (fax)

## 2020-07-23 NOTE — Progress Notes (Signed)
ReDS Vest / Clip - 07/23/20 0900      ReDS Vest / Clip   Station Marker A    Ruler Value 27.5    ReDS Value Range Moderate volume overload    ReDS Actual Value 36

## 2020-07-23 NOTE — Patient Instructions (Addendum)
Labs done today. We will contact you only if your labs are abnormal.  No medication changes were made. Please continue all current medications as prescribed.  Your physician recommends that you schedule a follow-up appointment in: 3 months with an echo prior to your exam  Your physician has requested that you have an echocardiogram. Echocardiography is a painless test that uses sound waves to create images of your heart. It provides your doctor with information about the size and shape of your heart and how well your heart's chambers and valves are working. This procedure takes approximately one hour. There are no restrictions for this procedure.  You have been referred to Cardiac Rehab at [facility]. They will contact you to schedule an appointment  You have been given a paper prescription to for compression hoses/stockings. Please wear your compression hose daily, place them on as soon as you get up in the morning and remove before you go to bed at night.   If you have any questions or concerns before your next appointment please send [name] a message through [place] or call our office at [phone].    TO LEAVE A MESSAGE FOR THE NURSE SELECT OPTION 2, PLEASE LEAVE A MESSAGE INCLUDING: . YOUR NAME . DATE OF BIRTH . CALL BACK NUMBER . REASON FOR CALL**this is important as we prioritize the call backs  YOU WILL RECEIVE A CALL BACK THE SAME DAY AS LONG AS YOU CALL BEFORE 4:00 PM   Do the following things EVERYDAY: 1) Weigh yourself in the morning before breakfast. Write it down and keep it in a log. 2) Take your medicines as prescribed 3) Eat low salt foods--Limit salt (sodium) to 2000 mg per day.  4) Stay as active as you can everyday 5) Limit all fluids for the day to less than 2 liters   At the [facility], you and your health needs are our priority. As part of our continuing mission to provide you with exceptional heart care, we have created  designated Provider Care Teams. These Care Teams include your primary Cardiologist (physician) and Advanced Practice Providers (APPs- Physician Assistants and Nurse Practitioners) who all work together to provide you with the care you need, when you need it.   You may see any of the following providers on your designated Care Team at your next follow up: Marland Kitchen Dr Glori Bickers . Dr [name] . Darrick Grinder, NP . Lyda Jester, PA . Audry Riles, PharmD   Please be sure to bring in all your medications bottles to every appointment.

## 2020-07-23 NOTE — Progress Notes (Signed)
  Echocardiogram 2D Echocardiogram has been performed.  [name] 07/23/2020, 10:26 AM

## 2020-07-24 ENCOUNTER — Telehealth (HOSPITAL_COMMUNITY): Payer: Self-pay

## 2020-07-24 ENCOUNTER — Other Ambulatory Visit (HOSPITAL_COMMUNITY): Payer: Self-pay | Admitting: Cardiology

## 2020-07-24 DIAGNOSIS — I5022 Chronic systolic (congestive) heart failure: Secondary | ICD-10-CM

## 2020-07-24 LAB — ECHOCARDIOGRAM COMPLETE
Area-P 1/2: 9.85 cm2
Height: 64 in
S' Lateral: 5.4 cm
Weight: 3740.8 oz

## 2020-07-24 LAB — PRO B NATRIURETIC PEPTIDE: NT-Pro BNP: 312 pg/mL — ABNORMAL HIGH (ref 0–249)

## 2020-07-24 NOTE — Telephone Encounter (Signed)
-----   Message from Larey Dresser, MD sent at 07/23/2020 11:56 AM EST ----- Start KCl 20 daily.  TSH high, needs repeat TSH, free T3, free T4.  BMET in 1 week with low K.

## 2020-07-24 NOTE — Telephone Encounter (Signed)
Malena Edman, RN  07/24/2020 3:32 PM EST Back to Top     Patient advised and verbalized understanding. Med list updated. Lab appt scheduled and lab orders placed

## 2020-07-25 ENCOUNTER — Telehealth (HOSPITAL_COMMUNITY): Payer: Self-pay

## 2020-07-25 NOTE — Telephone Encounter (Signed)
-----   Message from Larey Dresser, MD sent at 07/24/2020  4:00 PM EST ----- Stable low EF, 20-25%.

## 2020-07-25 NOTE — Telephone Encounter (Signed)
Patient aware.

## 2020-07-26 ENCOUNTER — Other Ambulatory Visit: Payer: Self-pay

## 2020-07-26 ENCOUNTER — Encounter: Payer: BC Managed Care – PPO | Attending: Cardiology

## 2020-07-26 ENCOUNTER — Encounter: Payer: BC Managed Care – PPO | Admitting: *Deleted

## 2020-07-26 DIAGNOSIS — I5022 Chronic systolic (congestive) heart failure: Secondary | ICD-10-CM | POA: Insufficient documentation

## 2020-07-26 DIAGNOSIS — Z006 Encounter for examination for normal comparison and control in clinical research program: Secondary | ICD-10-CM

## 2020-07-26 NOTE — Research (Incomplete Revision)
Pt here to see Hiral Lukasiewicz for his scan.   Bifurcation to high on both sides.  See Dr Brabhams note    I performed ultrasound of the patient's carotid bifurcation today.  I feel that it is too high to consider her for the Batwire trial.  I will see if we can get her into the commercial arm.  [name] 

## 2020-07-26 NOTE — Research (Addendum)
Pt here to see Brabham for his scan.   Bifurcation to high on both sides.  See Dr Aleen Campi note    I performed ultrasound of the patient's carotid bifurcation today.  I feel that it is too high to consider her for the [facility] trial.  I will see if we can get her into the commercial arm.  Annamarie Major

## 2020-07-26 NOTE — Progress Notes (Signed)
Pt aware  Batwire #16 Pt coming in for scan with Brabham 02/03

## 2020-07-26 NOTE — Progress Notes (Signed)
Virtual Visit completed. Patient informed on EP and RD appointment and 6 Minute walk test. Patient also informed of patient health questionnaires on My Chart. Patient Verbalizes understanding. Visit diagnosis can be found in [facility] 07/23/2020.

## 2020-07-26 NOTE — Progress Notes (Signed)
Pt aware of results  Batwire #16

## 2020-07-30 ENCOUNTER — Other Ambulatory Visit: Payer: Self-pay

## 2020-07-30 ENCOUNTER — Other Ambulatory Visit (HOSPITAL_COMMUNITY): Payer: Self-pay | Admitting: Cardiology

## 2020-07-30 ENCOUNTER — Encounter: Payer: Self-pay | Admitting: Surgery

## 2020-07-30 ENCOUNTER — Ambulatory Visit (INDEPENDENT_AMBULATORY_CARE_PROVIDER_SITE_OTHER): Payer: BC Managed Care – PPO | Admitting: Surgery

## 2020-07-30 VITALS — BP 101/71 | HR 80 | Temp 98.0°F | Resp 20 | Ht 64.0 in | Wt 243.4 lb

## 2020-07-30 DIAGNOSIS — I5042 Chronic combined systolic (congestive) and diastolic (congestive) heart failure: Secondary | ICD-10-CM

## 2020-07-30 NOTE — Progress Notes (Signed)
[facility] [place]  Patient name: [name] MRN: [id number] DOB: Nov 19, 1970 Sex: female   REQUESTING PROVIDER:    Dr. [name]   REASON FOR CONSULT:    Barostim Eval  HISTORY OF PRESENT ILLNESS:   [name] is a 49 y.o. female, who was evaluated for BATWIRE, however I felt that her bifurcation was too high, and so she was to be considered for the commercial implant.  Patient suffers from class III heart failure secondary to nonischemic cardiomyopathy.  She had a ICD placed in 2006.  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  Echo at that time showed an ejection fraction of 20-25%.  She underwent thrombectomy and stent placement by neuroradiology.  Her only residual symptom from her stroke is mild right leg tingling.  The patient does use CPAP at home.  She takes a statin for hypercholesterolemia.  She is on Brilinta for intracranial carotid stent.  PAST MEDICAL HISTORY    Past Medical History:  Diagnosis Date  . Automatic implantable cardioverter-defibrillator in situ   . Chronic CHF (congestive heart failure) (HCC)    a. EF 15-20% b. RHC (09/2013) RA 14, RV 57/22, PA 64/36 (48), PCWP 18, FIck CO/CI 3.7/1.6, PVR 8.1 WU, PA sat 47%   . History of stomach ulcers   . Hypertension   . Hypotension   . Hypothyroidism   . Morbid obesity ([place])   . Myocardial infarction ([place]) 08/2013  . Nocturnal dyspnea   . Nonischemic cardiomyopathy ([place])   . Sinus tachycardia   . Snoring-prob OSA 09/04/2011  . Sprint Fidelis ICD lead RECALL  [id number]   . UARS (upper airway resistance syndrome) 09/04/2011   HST 12/2013:  AHI 4/hr (numerous episodes of airflow reduction that did not have concomitant desaturation)      FAMILY HISTORY   Family History  Problem Relation Age of Onset  . Heart disease Maternal Grandmother   . Heart disease Mother   . Obesity Mother   . High blood pressure Father   .  Stroke Father     SOCIAL HISTORY:   Social History   Socioeconomic History  . Marital status: Married    Spouse name: Taffany Gerwin  . Number of children: 2  . Years of education: Not on file  . Highest education level: Not on file  Occupational History  . 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 Smoking status: Never Smoker  . Smokeless tobacco: Never Used  Vaping Use  . Vaping Use: Never used  Substance and Sexual Activity  . Alcohol use: No  . Drug use: No  . Sexual activity: Yes  Other Topics Concern  . Not on file  Social History Narrative  . Not on file   Social Determinants of Health   Financial Resource Strain: Not on file  Food Insecurity: Not on file  Transportation Needs: Not on file  Physical Activity: Not on file  Stress: Not on file  Social Connections: Not on file  Intimate Partner Violence: Not on file    ALLERGIES:    No Known Allergies  CURRENT MEDICATIONS:    Current Outpatient Medications  Medication Sig Dispense Refill  . amiodarone (PACERONE) 200 MG tablet TAKE 1 TABLET BY MOUTH DAILY 90 tablet 3  . aspirin 81 MG chewable tablet Chew 1 tablet (81 mg total) by mouth daily. 30 tablet 11  . atorvastatin (LIPITOR)  40 MG tablet Take 40 mg by mouth daily.    . carvedilol (COREG) 6.25 MG tablet TAKE 1 TABLET BY MOUTH TWICE A DAY WITH A MEAL. 60 tablet 0  . digoxin (LANOXIN) 0.125 MG tablet Take 0.5 tablets (0.0625 mg total) by mouth daily. 30 tablet 11  . FARXIGA 10 MG TABS tablet TAKE 1 TABLET BY MOUTH ONCE A DAY BEFOREBREAKFAST 30 tablet 5  . levothyroxine (SYNTHROID) 25 MCG tablet Take 1 tablet (25 mcg total) by mouth daily before breakfast. 90 tablet 3  . metolazone (ZAROXOLYN) 2.5 MG tablet Take 1 tablet (2.5 mg total) by mouth as needed (extra fluid as directed by HF clinic). 5 tablet 3  . potassium chloride SA (KLOR-CON) 20 MEQ tablet Take 20 mEq by mouth 2 (two) times daily.    . sacubitril-valsartan (ENTRESTO)  24-26 MG Take 1 tablet by mouth 2 (two) times daily. 60 tablet 3  . spironolactone (ALDACTONE) 25 MG tablet Take 25 mg by mouth daily.    . ticagrelor (BRILINTA) 90 MG TABS tablet Take 1 tablet (90 mg total) by mouth 2 (two) times daily. 60 tablet 11  . torsemide (DEMADEX) 20 MG tablet TAKE 4 TABLETS (80 MG) BY MOUTH IN THE MORNING. 90 tablet 0  . Vitamin D, Ergocalciferol, (DRISDOL) 1.25 MG (50000 UNIT) CAPS capsule Take 1 capsule (50,000 Units total) by mouth every 7 (seven) days. 4 capsule 0   No current facility-administered medications for this visit.    REVIEW OF SYSTEMS:   [X]  denotes positive finding, [ ]  denotes negative finding Cardiac  Comments:  Chest pain or chest pressure:    Shortness of breath upon exertion:    Short of breath when lying flat:    Irregular heart rhythm:        Vascular    Pain in calf, thigh, or hip brought on by ambulation:    Pain in feet at night that wakes you up from your sleep:     Blood clot in your veins:    Leg swelling:         Pulmonary    Oxygen at home:    Productive cough:     Wheezing:         Neurologic    Sudden weakness in arms or legs:     Sudden numbness in arms or legs:     Sudden onset of difficulty speaking or slurred speech:    Temporary loss of vision in one eye:     Problems with dizziness:         Gastrointestinal    Blood in stool:      Vomited blood:         Genitourinary    Burning when urinating:     Blood in urine:        Psychiatric    Major depression:         Hematologic    Bleeding problems:    Problems with blood clotting too easily:        Skin    Rashes or ulcers:        Constitutional    Fever or chills:     PHYSICAL EXAM:   Vitals:   07/30/20 1038  BP: 101/71  Pulse: 80  Resp: 20  Temp: 98 F (36.7 C)  SpO2: 99%  Weight: 243 lb 6.4 oz (110.4 kg)  Height: 5\' 4"  (1.626 m)    GENERAL: The patient is a well-nourished female, in no acute distress. The  vital signs are documented  above. CARDIAC: There is a regular rate and rhythm.  VASCULAR: Carotid artery evaluated with ultrasound.  Bifurcation is on the high side.  No significant calcification within the carotid artery. PULMONARY: Nonlabored respirations MUSCULOSKELETAL: There are no major deformities or cyanosis. NEUROLOGIC: No focal weakness or paresthesias are detected. SKIN: There are no ulcers or rashes noted. PSYCHIATRIC: The patient has a normal affect.  STUDIES:   I have reviewed the following: CAROTID DUPLEX: Right Carotid: The extracranial vessels were near-normal with only minimal  wall         thickening or plaque.   Left Carotid: The extracranial vessels were near-normal with only minimal  wall        thickening or plaque.   Vertebrals: Bilateral vertebral arteries demonstrate antegrade flow.  Subclavians: Normal flow hemodynamics were seen in bilateral subclavian        arteries.   ECHO: 1. Left ventricular ejection fraction, by estimation, is 20 to 25%. The  left ventricle has severely decreased function. The left ventricle  demonstrates global hypokinesis. The left ventricular internal cavity size  was moderately dilated. Left  ventricular diastolic parameters are indeterminate.  2. Right ventricular systolic function is mildly reduced. The right  ventricular size is normal. There is normal pulmonary artery systolic  pressure. The estimated right ventricular systolic pressure is 00.1 mmHg.  3. Left atrial size was moderately dilated.  4. The mitral valve is normal in structure. Mild mitral valve  regurgitation. No evidence of mitral stenosis.  5. The aortic valve is grossly normal. Aortic valve regurgitation is  trivial. No aortic stenosis is present.  6. The inferior vena cava is normal in size with greater than 50%  respiratory variability, suggesting right atrial pressure of 3 mmHg.   ASSESSMENT and PLAN   We discussed proceeding with BAROSTIM  device implant.  I discussed the details of the operation including the incisions.  We discussed that she would most likely be discharged the same day of surgery.  She will need to be off of her Brilinta prior to her procedure.  I discussed the risks and benefits including the risk of infection.  All questions were answered.  We will get her scheduled pending insurance approval.   Leia Alf, MD, FACS [facility] [phone] Pager [phone]

## 2020-07-31 ENCOUNTER — Ambulatory Visit (INDEPENDENT_AMBULATORY_CARE_PROVIDER_SITE_OTHER): Payer: BC Managed Care – PPO | Admitting: Adult Health

## 2020-07-31 ENCOUNTER — Encounter: Payer: Self-pay | Admitting: Adult Health

## 2020-07-31 VITALS — BP 106/71 | HR 76 | Ht 64.0 in | Wt 239.6 lb

## 2020-07-31 DIAGNOSIS — I63312 Cerebral infarction due to thrombosis of left middle cerebral artery: Secondary | ICD-10-CM

## 2020-07-31 DIAGNOSIS — R209 Unspecified disturbances of skin sensation: Secondary | ICD-10-CM

## 2020-07-31 DIAGNOSIS — I69398 Other sequelae of cerebral infarction: Secondary | ICD-10-CM

## 2020-07-31 NOTE — Patient Instructions (Addendum)
Continue aspirin 81 mg daily and Brilinta (ticagrelor) 90 mg bid  and atorvastatin for secondary stroke prevention  Follow-up with Dr. Estanislado Pandy with repeat imaging for surveillance monitoring of your cerebral stent -ongoing use of both aspirin and Brilinta will be further determined and managed by Dr. Arlean Hopping office  Continue to follow with cardiology as scheduled  Continue to follow up with PCP regarding cholesterol and blood pressure management  Maintain strict control of hypertension with blood pressure goal below 130/90 and cholesterol with LDL cholesterol (bad cholesterol) goal below 70 mg/dL.      Followup in the future with me in 3 months or call earlier if needed      Thank you for coming to see [name] at [facility]. I hope we have been able to provide you high quality care today.  You may receive a patient satisfaction survey over the next few weeks. We would appreciate your feedback and comments so that we may continue to improve ourselves and the health of our patients.   Sensory Loss After a Stroke A stroke can damage parts of your brain that control your body's normal functions, including your senses. As a result, you may have sensory loss, such as trouble seeing, tasting, swallowing, or feeling touch or pressure sensations. You may have problems feeling temperature changes or moving your body in a coordinated way. You may also perceive smell differently. You may have problems with all of your senses or only some of them. By following a treatment plan, you may recover lost senses and manage the changes to your lifestyle. What are treatment therapies for sensory loss? You may have a combination of therapies for sensory loss.  Physical therapy. This may include: ? Exercises to improve your coordination and balance. ? Exercises that combine touch, balance, and movement (sensorimotor training). ? Movements to relieve pressure while you are sitting or  lying (mobility training). ? Splints or braces to protect parts of your body that you cannot feel.  Devices to help with blood flow (circulation) and to help stimulate nerves in affected parts of your body.  Speech therapy to help you swallow safely.  Occupational therapy to help you with everyday tasks. This may include: ? Exercises or devices to improve your vision. ? Exercises to help you increase your touch perception. These may include feeling objects of different sizes and textures while your eyes are closed. ? Techniques for moving safely in your environment.  Prescription eye glasses for vision loss.  Hearing aids for hearing loss. Follow these instructions at home: Safety  Your risk of falling is higher after a stroke. You may have difficulty feeling your legs and feet or coordinating your movements. To lower your risk of falling: ? Use devices to help you move around (assistive devices), such as a wheelchair or walker, as directed by your health care team. ? Wear prescription eye glasses at all times when moving around. ? Use lights to help you see in the dark. ? Use grab bars in bathrooms and handrails in stairways. ? Keep walkways clear in your home by removing rugs, cords, and clutter from the floor.  Your risk of getting burned is higher after a stroke. To lower your risk of burns: ? Test the water temperature before taking a bath or washing your hands. ? Allow hot foods to cool slightly before eating. ? Use potholders when handling hot pans.  When using sharp objects, such as scissors or knives, use your healthy hand. Do not  handle sharp objects with your hand that was affected by your stroke, if this applies.   Activity  Return to your normal activities as told by your health care provider. Ask your health care provider what activities are safe for you.  Avoid spending too much time sitting or lying down. If you must be in a chair or bed, change positions  regularly.  You may have problems doing your normal activities. Ask your health care provider about getting extra help at home. Eating and drinking  You may have problems swallowing food and fluids after a stroke. The problems can be due to: ? Changes in your muscles. ? Sensory changes, such as:  Difficulty feeling the consistency or size of a piece of food in your mouth.  Inability to feel the need to clear your throat.  You may need to: ? Take smaller bites and chew thoroughly. Make sure you have swallowed all the food in your mouth before you take another bite. ? Sit in an upright position when eating or drinking. ? Avoid distractions while eating or drinking. ? Stay upright for 30-45 minutes after eating. ? Change the texture of some things that you eat and drink. This may include:  Changing foods to a smooth, mashed consistency (puree).  Thickening liquids.  Follow instructions from your health care provider about eating and drinking restrictions.   General instructions  Wear arm or leg braces as told by your health care team.  Get help at home as needed. You may need help getting dressed, bathing, using the bathroom, eating, or doing other activities.  Keep all follow-up visits as told by your health care providers. This is important. Summary  It is common to have sensory loss after a stroke. Sensory loss means that you have problems with some or all of your senses, such as vision, taste, hearing, smell, and touch.  Sensory loss happens because of damage to your brain and nervous system after a stroke.  Treatment for sensory loss may include physical, occupational, or speech therapy, and the use of assistive devices.  You may need to make changes to your home and lifestyle after a stroke to help you live safely and independently. This information is not intended to replace advice given to you by your health care provider. Make sure you discuss any questions you have  with your health care provider. Document Revised: 10/01/2018 Document Reviewed: 09/26/2016 Elsevier Patient Education  [place].

## 2020-07-31 NOTE — Progress Notes (Signed)
I agree with the above plan 

## 2020-07-31 NOTE — Progress Notes (Signed)
[facility] [address] [place]. [place] [address] [phone]       HOSPITAL FOLLOW UP NOTE  [name] November Date of Birth:  [birth year] Medical Record Number:  [id number]   Reason for Referral:  hospital stroke follow up    SUBJECTIVE:   CHIEF COMPLAINT:  Chief Complaint  Patient presents with  . New Patient (Initial Visit)    Treatment room with husband. Hospital follow up/stroke. She was discharged 04/20/20. Has surgery scheduled 08/24/20 to get implant placed for heart and carotid arteries. Told to stop Brillenta a week before procedure. Passed out one day from too high of a dose of Entrestro. Dose lowered and now she is tolerating ok.    HPI:   [name] a 50 y.o.femalewith a history of CHF with an EF of 15 to 20% who presented to [facility] ED on 04/17/2020 with right-sided weakness.  Personally reviewed hospitalization pertinent progress notes, lab work and imaging with summary provided.  Evaluated by Dr. Leonie Man with CT showing subtle hypodensity of the left [place] and evidence of left M1 occlusion s/p mechanical thrombectomy and stent.  Repeat CTA showed stent with nonocclusive thrombus along the posterior wall mid stent.  Unable to obtain MRI d/t ICD.  2D echo showed EF 20 to 25%.  ICD interrogated which did not show evidence of atrial fibrillation.  She was placed on Brilinta 90 mg twice daily and aspirin 81 mg daily for secondary stroke prevention.  She was advised to follow-up outpatient with Dr. Estanislado Pandy. 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  History of HLD on atorvastatin 40 mg daily with LDL 127.  No prior history or evidence of DM with A1c  5.3.  Other stroke risk factors include hx of substance abuse, obesity, family history of stroke, CAD/MI, OSA on CPAP and systolic CHF.  Evaluated by therapies and recommended discharge home with OP PT/OT.    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   CT perfusion with 64 cc delay in left MCA territory and 0cc CBF and collateral flow to left MCA branches. Patient went for intervention with thrombectomy and stent placement of left M1 occlusion.     Post-stent CT 10/27 shows continued mild hypodensity of left putamen without other loss of grey-white differentiation or hemorrhage with stent at M1/M2 left MCA  CTA 10/28 with left MCA stent with nonocclusive thrombus along the posterior wall of the mid stent.   Goal bp systolic <283 from neurological standpoint.   Unable to obtain MRI/MRA due to ICD   2D Echo without thrombus, EF 20-25% and global hypokinesis, severely dilated LV w/grade III diastolic dysfunction. RVP 38 mmHg, decreased IVC respiratory variability, EF similar to prior 09/27/19  ICD negative for atrial fibrillation. Continue brillinta 90 mg bid and asa 81 mg qd.     LDL 127  HgbA1c 5.3  VTE prophylaxis - lovenox  Continue PT/OT   Therapy recommendations:  OP PT/OT/SLP  Disposition: Stable for discharge from neurological standpoint pending PT/OT evaluation today for CIR vs. HHPT. With EF 20-25% without thrombus, ICD negative for  atrial fibrillation. Recommend continuing brilinta 90 mg bid and asa 81 mg. Will possibly switch to warfarin in the future per cardiology with concern for thrombus being an ongoing concern. Follow-up in neurology clinic in six weeks.    Today, 07/31/2020, [name] is being seen for hospital follow-up from admission in 03/2020 accompanied by her husband.  She reports residual decreased right hand dexterity and  tingling right hand and foot.  At discharge, she initially had diminished sensation on right side but this has been slowly improving.  More recently, she will experience pins-and-needles sensation more at nighttime. She will use a compression sock with benefit.   She has not participated in any therapies as she reports being told that therapy was not needed.   Denies new or worsening stroke/TIA symptoms.  She has remained on aspirin and Brilinta without bleeding or bruising.  Remains on atorvastatin 40 mg daily without myalgias.  Blood pressure today 106/71.  Reports 1 syncopal event which sounds to be hypotension related and has not experienced any additional events since cardiology lowered Entresto dosage.  Reports compliance with CPAP for sleep apnea management.  She did have follow-up with Dr. Estanislado Pandy who plans on repeating CTA around this time -she reports calling office and leaving voicemail to schedule imaging 1-2 weeks ago but has not received return call back to schedule.  No further concerns at this time.    ROS:   14 system review of systems performed and negative with exception of those listed in HPI  PMH:  Past Medical History:  Diagnosis Date  . Automatic implantable cardioverter-defibrillator in situ   . Chronic CHF (congestive heart failure) (HCC)    a. EF 15-20% b. RHC (09/2013) RA 14, RV 57/22, PA 64/36 (48), PCWP 18, FIck CO/CI 3.7/1.6, PVR 8.1 WU, PA sat 47%   . History of stomach ulcers   . Hypertension   . Hypotension   . Hypothyroidism   . Morbid obesity ([place])   . Myocardial infarction ([place]) 08/2013  . Nocturnal dyspnea   . Nonischemic cardiomyopathy ([place])   . Sinus tachycardia   . Snoring-prob OSA 09/04/2011  . Sprint Fidelis ICD lead RECALL  [id number]   . UARS (upper airway resistance syndrome) 09/04/2011   HST 12/2013:  AHI 4/hr (numerous episodes of airflow reduction that did not have concomitant desaturation)     PSH:  Past Surgical History:  Procedure Laterality  Date  . BREATH TEK H PYLORI N/A 11/09/2014   Procedure: BREATH TEK H PYLORI;  Surgeon: Greer Pickerel, MD;  Location: [facility];  Service: General;  Laterality: N/A;  . CARDIAC CATHETERIZATION  ~ 2006; 09/2013  . CARDIAC CATHETERIZATION N/A 05/23/2016   Procedure: Right Heart Cath;  Surgeon: Larey Dresser, MD;  Location: [facility];  Service: Cardiovascular;  Laterality: N/A;  . CARDIAC DEFIBRILLATOR PLACEMENT  2006; 12/26/2013   Medtronic [id number]; 12-2013 ICD gen change and RV lead revision with new 0981 RV lead by Dr [name]  . CESAREAN SECTION  1999  . IMPLANTABLE CARDIOVERTER DEFIBRILLATOR GENERATOR CHANGE N/A 12/26/2013   Procedure: IMPLANTABLE CARDIOVERTER DEFIBRILLATOR GENERATOR CHANGE;  Surgeon: Deboraha Sprang, MD;  Location: [facility] CATH LAB;  Service: Cardiovascular;  Laterality: N/A;  . IR CT HEAD LTD  04/17/2020  . IR CT HEAD LTD  04/17/2020  . IR INTRA CRAN STENT  04/17/2020  . IR PERCUTANEOUS ART THROMBECTOMY/INFUSION INTRACRANIAL INC DIAG ANGIO  04/17/2020  . IR RADIOLOGIST EVAL & MGMT  06/05/2020  . LEAD REVISION  N/A 12/26/2013   Procedure: LEAD REVISION;  Surgeon: Deboraha Sprang, MD;  Location: [facility] CATH LAB;  Service: Cardiovascular;  Laterality: N/A;  . RADIOLOGY WITH ANESTHESIA N/A 04/17/2020   Procedure: Code Stroke;  Surgeon: Luanne Bras, MD;  Location: [facility];  Service: Radiology;  Laterality: N/A;  . RIGHT HEART CATHETERIZATION N/A 02/15/2014   Procedure: RIGHT HEART CATH;  Surgeon: Larey Dresser, MD;  Location: [facility] CATH LAB;  Service: Cardiovascular;  Laterality: N/A;  . TUBAL LIGATION  1999    Social History:  Social History   Socioeconomic History  . Marital status: Married    Spouse name: Lucky Alverson  . Number of children: 2  . Years of education: Not on file  . Highest education level: Not on file  Occupational History  . 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 Smoking status: Never Smoker  . Smokeless tobacco: Never Used   Vaping Use  . Vaping Use: Never used  Substance and Sexual Activity  . Alcohol use: No  . Drug use: No  . Sexual activity: Yes  Other Topics Concern  . Not on file  Social History Narrative   [place] sometimes   Right handed   Social Determinants of Health   Financial Resource Strain: Not on file  Food Insecurity: Not on file  Transportation Needs: Not on file  Physical Activity: Not on file  Stress: Not on file  Social Connections: Not on file  Intimate Partner Violence: Not on file    Family History:  Family History  Problem Relation Age of Onset  . Heart disease Maternal Grandmother   . Heart disease Mother   . Obesity Mother   . High blood pressure Father   . Stroke Father     Medications:   Current Outpatient Medications on File Prior to Visit  Medication Sig Dispense Refill  . amiodarone (PACERONE) 200 MG tablet TAKE 1 TABLET BY MOUTH DAILY 90 tablet 3  . aspirin 81 MG chewable tablet Chew 1 tablet (81 mg total) by mouth daily. 30 tablet 11  . atorvastatin (LIPITOR) 40 MG tablet Take 40 mg by mouth daily.    . carvedilol (COREG) 6.25 MG tablet TAKE 1 TABLET BY MOUTH TWICE A DAY WITH A MEAL. 60 tablet 0  . digoxin (LANOXIN) 0.125 MG tablet Take 0.5 tablets (0.0625 mg total) by mouth daily. 30 tablet 11  . FARXIGA 10 MG TABS tablet TAKE 1 TABLET BY MOUTH ONCE A DAY BEFOREBREAKFAST 30 tablet 5  . levothyroxine (SYNTHROID) 25 MCG tablet Take 1 tablet (25 mcg total) by mouth daily before breakfast. 90 tablet 3  . metolazone (ZAROXOLYN) 2.5 MG tablet Take 1 tablet (2.5 mg total) by mouth as needed (extra fluid as directed by HF clinic). 5 tablet 3  . potassium chloride SA (KLOR-CON) 20 MEQ tablet Take 20 mEq by mouth 2 (two) times daily.    . sacubitril-valsartan (ENTRESTO) 24-26 MG Take 1 tablet by mouth 2 (two) times daily. 60 tablet 3  . spironolactone (ALDACTONE) 25 MG tablet Take 25 mg by mouth daily.    . ticagrelor (BRILINTA) 90 MG TABS tablet Take 1 tablet (90 mg  total) by mouth 2 (two) times daily. 60 tablet 11  . torsemide (DEMADEX) 20 MG tablet TAKE 4 TABLETS (80 MG) BY MOUTH IN THE MORNING. 90 tablet 0  . Vitamin D, Ergocalciferol, (DRISDOL) 1.25 MG (50000 UNIT) CAPS capsule Take 1 capsule (50,000 Units total) by mouth every  7 (seven) days. 4 capsule 0   No current facility-administered medications on file prior to visit.    Allergies:  No Known Allergies    OBJECTIVE:  Physical Exam  Vitals:   07/31/20 0852  BP: 106/71  Pulse: 76  SpO2: 96%  Weight: 239 lb 9.6 oz (108.7 kg)  Height: 5\' 4"  (1.626 m)   Body mass index is 41.13 kg/m. No exam data present  General: well developed, well nourished,  pleasant middle-aged American female seated, in no evident distress Head: head normocephalic and atraumatic.   Neck: supple with no carotid or supraclavicular bruits Cardiovascular: regular rate and rhythm, no murmurs Musculoskeletal: no deformity Skin:  no rash/petichiae Vascular:  Normal pulses all extremities   Neurologic Exam Mental Status: Awake and fully alert.   Fluent speech and language.  Oriented to place and time. Recent and remote memory intact. Attention span, concentration and fund of knowledge appropriate. Mood and affect appropriate.  Cranial Nerves: Fundoscopic exam reveals sharp disc margins. Pupils equal, briskly reactive to light. Extraocular movements full without nystagmus. Visual fields full to confrontation. Hearing intact. Facial sensation intact. Face, tongue, palate moves normally and symmetrically.  Motor: Normal bulk and tone. Normal strength in all tested extremity muscles except slightly decreased right hand dexterity and mild right ankle dorsiflexion weakness Sensory.: intact to touch , pinprick , position and vibratory sensation is subjectively reports "odd sensation" RUE and RLE with light touch.  Coordination: Rapid alternating movements normal in all extremities except slightly decreased right hand  dexterity. Finger-to-nose and heel-to-shin performed accurately bilaterally. Gait and Station: Arises from chair without difficulty. Stance is normal. Gait demonstrates normal stride length and balance without use of assistive device.  Tandem walk and heel toe performed without difficulty.  Romberg negative. Reflexes: 1+ and symmetric. Toes downgoing.     NIHSS  1 Modified Rankin  2      ASSESSMENT: TELESHIA LEMERE is a 50 y.o. year old female presented with right-sided weakness on 04/17/2020 with stroke work-up revealing left MCA embolic stroke in setting of left M1 occlusion s/p IR thrombectomy and stent placement felt secondary to cardioembolic etiology.  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      PLAN:  1. L MCA stroke :  a. Residual deficit: Right sensory impairment.  Provided exercises to complete at home with likely continued improvement.  May consider therapies in the future if indicated b. Continue aspirin 81 mg daily and Brilinta (ticagrelor) 90 mg bid  and atorvastatin for secondary stroke prevention.   c. Discussed secondary stroke prevention measures and importance of close PCP follow up for aggressive stroke risk factor management  2. L M1 occlusion: s/p stent on aspirin and Brilinta. Will attempt to reach out to IR Dr. Arlean Hopping office for further assistance in scheduling patient with repeat CTA as recommended by Dr. Estanislado Pandy at initial [place].  Advised ongoing use of aspirin and Brilinta will be determined and managed by IR 3. HTN: BP goal <130/90.  Stable but on lower side on multiple medications managed and monitored by cardiology 4. HLD: LDL goal <70. Recent LDL 127.  On atorvastatin 40 mg daily per PCP/cardiology 5. Systolic HF: Routinely monitored by cardiology    Follow up in 3 months or call earlier if needed  CC:  [place] provider: Dr. Candi Leash, MD    I  spent 45 minutes of face-to-face and non-face-to-face  time with patient and husband.  This included previsit chart review including recent hospitalization, lab work and imaging, lab review, study review, order entry, electronic health record documentation, patient education regarding recent stroke including etiology, residual deficits and further exercises, importance of managing stroke risk factors and answered all other questions to patient and husband's satisfaction   Frann Rider, AGNP-BC  [facility] [address] [place], [place] [address]  Phone [phone] Fax [phone] Note: This document was prepared with digital dictation and possible smart phrase technology. Any transcriptional errors that result from this process are unintentional.

## 2020-08-01 ENCOUNTER — Other Ambulatory Visit (HOSPITAL_COMMUNITY): Payer: Self-pay | Admitting: Cardiology

## 2020-08-06 ENCOUNTER — Encounter: Payer: BC Managed Care – PPO | Admitting: *Deleted

## 2020-08-06 ENCOUNTER — Other Ambulatory Visit: Payer: Self-pay

## 2020-08-06 VITALS — Ht 64.1 in | Wt 240.0 lb

## 2020-08-06 DIAGNOSIS — I5022 Chronic systolic (congestive) heart failure: Secondary | ICD-10-CM | POA: Diagnosis not present

## 2020-08-06 NOTE — Patient Instructions (Signed)
Patient Instructions  Patient Details  Name: [name] MRN: [id number] Date of Birth: 09-30-1970 Referring Provider:  Larey Dresser, MD  Below are your personal goals for exercise, nutrition, and risk factors. Our goal is to help you stay on track towards obtaining and maintaining these goals. We will be discussing your progress on these goals with you throughout the program.  Initial Exercise Prescription:  Initial Exercise Prescription - 08/06/20 1400      Date of Initial Exercise RX and Referring Provider   Date 08/06/20    Referring Provider [name] MD      Treadmill   MPH 2.3    Grade 1    Minutes 15    METs 3.08      NuStep   Level 3    SPM 80    Minutes 15    METs 3      REL-XR   Level 2    Speed 50    Minutes 15    METs 3      Biostep-RELP   Level 3    SPM 50    Minutes 15    METs 3      Prescription Details   Frequency (times per week) 3    Duration Progress to 30 minutes of continuous aerobic without signs/symptoms of physical distress      Intensity   THRR 40-80% of Max Heartrate 128-157    Ratings of Perceived Exertion 11-13    Perceived Dyspnea 0-4      Progression   Progression Continue to progress workloads to maintain intensity without signs/symptoms of physical distress.      Resistance Training   Training Prescription Yes    Weight 3 lb    Reps 10-15           Exercise Goals: Frequency: Be able to perform aerobic exercise two to three times per week in program working toward 2-5 days per week of home exercise.  Intensity: Work with a perceived exertion of 11 (fairly light) - 15 (hard) while following your exercise prescription.  We will make changes to your prescription with you as you progress through the program.   Duration: Be able to do 30 to 45 minutes of continuous aerobic exercise in addition to a 5 minute warm-up and a 5 minute cool-down routine.   Nutrition Goals: Your personal nutrition goals will be  established when you do your nutrition analysis with the dietician.  The following are general nutrition guidelines to follow: Cholesterol < 200mg /day Sodium < 1500mg /day Fiber: Women under 50 yrs - 25 grams per day  Personal Goals:  Personal Goals and Risk Factors at Admission - 08/06/20 1423      Core Components/Risk Factors/Patient Goals on Admission    Weight Management Yes;Weight Loss;Obesity    Intervention Weight Management: Develop a combined nutrition and exercise program designed to reach desired caloric intake, while maintaining appropriate intake of nutrient and fiber, sodium and fats, and appropriate energy expenditure required for the weight goal.;Weight Management: Provide education and appropriate resources to help participant work on and attain dietary goals.;Weight Management/Obesity: Establish reasonable short term and long term weight goals.;Obesity: Provide education and appropriate resources to help participant work on and attain dietary goals.    Admit Weight 240 lb (108.9 kg)    Goal Weight: Short Term 235 lb (106.6 kg)    Goal Weight: Long Term 230 lb (104.3 kg)    Expected Outcomes Short Term: Continue to assess and modify  interventions until short term weight is achieved;Long Term: Adherence to nutrition and physical activity/exercise program aimed toward attainment of established weight goal;Weight Loss: Understanding of general recommendations for a balanced deficit meal plan, which promotes 1-2 lb weight loss per week and includes a negative energy balance of [phone] kcal/d;Understanding recommendations for meals to include 15-35% energy as protein, 25-35% energy from fat, 35-60% energy from carbohydrates, less than 200mg  of dietary cholesterol, 20-35 gm of total fiber daily;Understanding of distribution of calorie intake throughout the day with the consumption of 4-5 meals/snacks    Heart Failure Yes    Intervention Provide a combined exercise and nutrition program  that is supplemented with education, support and counseling about heart failure. Directed toward relieving symptoms such as shortness of breath, decreased exercise tolerance, and extremity edema.    Expected Outcomes Improve functional capacity of life;Short term: Attendance in program 2-3 days a week with increased exercise capacity. Reported lower sodium intake. Reported increased fruit and vegetable intake. Reports medication compliance.;Short term: Daily weights obtained and reported for increase. Utilizing diuretic protocols set by physician.;Long term: Adoption of self-care skills and reduction of barriers for early signs and symptoms recognition and intervention leading to self-care maintenance.    Hypertension Yes    Intervention Monitor prescription use compliance.;Provide education on lifestyle modifcations including regular physical activity/exercise, weight management, moderate sodium restriction and increased consumption of fresh fruit, vegetables, and low fat dairy, alcohol moderation, and smoking cessation.    Expected Outcomes Short Term: Continued assessment and intervention until BP is < 140/16mm HG in hypertensive participants. < 130/22mm HG in hypertensive participants with diabetes, heart failure or chronic kidney disease.;Long Term: Maintenance of blood pressure at goal levels.    Lipids Yes    Intervention Provide education and support for participant on nutrition & aerobic/resistive exercise along with prescribed medications to achieve LDL 70mg , HDL >40mg .    Expected Outcomes Short Term: Participant states understanding of desired cholesterol values and is compliant with medications prescribed. Participant is following exercise prescription and nutrition guidelines.;Long Term: Cholesterol controlled with medications as prescribed, with individualized exercise RX and with personalized nutrition plan. Value goals: LDL < 70mg , HDL > 40 mg.           Tobacco Use Initial  Evaluation: Social History   Tobacco Use  Smoking Status Never Smoker  Smokeless Tobacco Never Used    Exercise Goals and Review:  Exercise Goals    Row Name 08/06/20 1412             Exercise Goals   Increase Physical Activity Yes       Intervention Provide advice, education, support and counseling about physical activity/exercise needs.;Develop an individualized exercise prescription for aerobic and resistive training based on initial evaluation findings, risk stratification, comorbidities and participant's personal goals.       Expected Outcomes Short Term: Attend rehab on a regular basis to increase amount of physical activity.;Long Term: Add in home exercise to make exercise part of routine and to increase amount of physical activity.;Long Term: Exercising regularly at least 3-5 days a week.       Increase Strength and Stamina Yes       Intervention Provide advice, education, support and counseling about physical activity/exercise needs.;Develop an individualized exercise prescription for aerobic and resistive training based on initial evaluation findings, risk stratification, comorbidities and participant's personal goals.       Expected Outcomes Short Term: Increase workloads from initial exercise prescription for resistance, speed, and METs.;Short Term:  Perform resistance training exercises routinely during rehab and add in resistance training at home;Long Term: Improve cardiorespiratory fitness, muscular endurance and strength as measured by increased METs and functional capacity (6MWT)       Able to understand and use rate of perceived exertion (RPE) scale Yes       Intervention Provide education and explanation on how to use RPE scale       Expected Outcomes Short Term: Able to use RPE daily in rehab to express subjective intensity level;Long Term:  Able to use RPE to guide intensity level when exercising independently       Able to understand and use Dyspnea scale Yes        Intervention Provide education and explanation on how to use Dyspnea scale       Expected Outcomes Short Term: Able to use Dyspnea scale daily in rehab to express subjective sense of shortness of breath during exertion;Long Term: Able to use Dyspnea scale to guide intensity level when exercising independently       Knowledge and understanding of Target Heart Rate Range (THRR) Yes       Intervention Provide education and explanation of THRR including how the numbers were predicted and where they are located for reference       Expected Outcomes Short Term: Able to state/look up THRR;Short Term: Able to use daily as guideline for intensity in rehab;Long Term: Able to use THRR to govern intensity when exercising independently       Able to check pulse independently Yes       Intervention Provide education and demonstration on how to check pulse in carotid and radial arteries.;Review the importance of being able to check your own pulse for safety during independent exercise       Expected Outcomes Short Term: Able to explain why pulse checking is important during independent exercise;Long Term: Able to check pulse independently and accurately       Understanding of Exercise Prescription Yes       Intervention Provide education, explanation, and written materials on patient's individual exercise prescription       Expected Outcomes Short Term: Able to explain program exercise prescription;Long Term: Able to explain home exercise prescription to exercise independently              Copy of goals given to participant.

## 2020-08-06 NOTE — Progress Notes (Signed)
Cardiac Individual Treatment Plan  Patient Details  Name: [name] MRN: [id number] Date of Birth: 1971-03-08 Referring Provider:   Flowsheet Row Cardiac Rehab from 08/06/2020 in [facility] Cardiac and Pulmonary Rehab  Referring Provider [name] MD      Initial Encounter Date:  Flowsheet Row Cardiac Rehab from 08/06/2020 in [facility] Cardiac and Pulmonary Rehab  Date 08/06/20      Visit Diagnosis: Heart failure, chronic systolic ([place])  Patient's Home Medications on Admission:  Current Outpatient Medications:  .  amiodarone (PACERONE) 200 MG tablet, TAKE 1 TABLET BY MOUTH DAILY, Disp: 90 tablet, Rfl: 3 .  aspirin 81 MG chewable tablet, Chew 1 tablet (81 mg total) by mouth daily., Disp: 30 tablet, Rfl: 11 .  atorvastatin (LIPITOR) 40 MG tablet, Take 40 mg by mouth daily., Disp: , Rfl:  .  carvedilol (COREG) 6.25 MG tablet, TAKE 1 TABLET BY MOUTH TWICE A DAY WITH A MEAL., Disp: 60 tablet, Rfl: 0 .  digoxin (LANOXIN) 0.125 MG tablet, Take 0.5 tablets (0.0625 mg total) by mouth daily., Disp: 30 tablet, Rfl: 11 .  FARXIGA 10 MG TABS tablet, TAKE 1 TABLET BY MOUTH ONCE A DAY BEFOREBREAKFAST, Disp: 30 tablet, Rfl: 5 .  levothyroxine (SYNTHROID) 25 MCG tablet, Take 1 tablet (25 mcg total) by mouth daily before breakfast., Disp: 90 tablet, Rfl: 3 .  metolazone (ZAROXOLYN) 2.5 MG tablet, Take 1 tablet (2.5 mg total) by mouth as needed (extra fluid as directed by HF clinic)., Disp: 5 tablet, Rfl: 3 .  potassium chloride SA (KLOR-CON) 20 MEQ tablet, Take 20 mEq by mouth 2 (two) times daily., Disp: , Rfl:  .  sacubitril-valsartan (ENTRESTO) 24-26 MG, Take 1 tablet by mouth 2 (two) times daily., Disp: 60 tablet, Rfl: 3 .  spironolactone (ALDACTONE) 25 MG tablet, Take 25 mg by mouth daily., Disp: , Rfl:  .  ticagrelor (BRILINTA) 90 MG TABS tablet, Take 1 tablet (90 mg total) by mouth 2 (two) times daily., Disp: 60 tablet, Rfl: 11 .  torsemide (DEMADEX) 20 MG tablet, TAKE 4 TABLETS (80 MG) BY MOUTH IN  THE MORNING., Disp: 90 tablet, Rfl: 0 .  Vitamin D, Ergocalciferol, (DRISDOL) 1.25 MG (50000 UNIT) CAPS capsule, Take 1 capsule (50,000 Units total) by mouth every 7 (seven) days., Disp: 4 capsule, Rfl: 0  Past Medical History: Past Medical History:  Diagnosis Date  . Automatic implantable cardioverter-defibrillator in situ   . Chronic CHF (congestive heart failure) (HCC)    a. EF 15-20% b. RHC (09/2013) RA 14, RV 57/22, PA 64/36 (48), PCWP 18, FIck CO/CI 3.7/1.6, PVR 8.1 WU, PA sat 47%   . History of stomach ulcers   . Hypertension   . Hypotension   . Hypothyroidism   . Morbid obesity ([place])   . Myocardial infarction ([place]) 08/2013  . Nocturnal dyspnea   . Nonischemic cardiomyopathy ([place])   . Sinus tachycardia   . Snoring-prob OSA 09/04/2011  . Sprint Fidel[employer] ICD lead RECALL  [id number]   . UARS (upper airway resistance syndrome) 09/04/2011   HST 12/2013:  AHI 4/hr (numerous episodes of airflow reduction that did not have concomitant desaturation)     Tobacco Use: Social History   Tobacco Use  Smoking Status Never Smoker  Smokeless Tobacco Never Used    Labs: Recent Review Flowsheet Data    Labs for ITP Cardiac and Pulmonary Rehab Latest Ref Rng & Units 12/01/2019 04/17/2020 04/17/2020 04/17/2020 04/18/2020   Cholestrol 0 - 200 mg/dL - - - - 198  LDLCALC 0 - 99 mg/dL - - - - 130(H)   HDL >40 mg/dL - - - - 50   Trlycerides <150 mg/dL - - - 97 92   Hemoglobin A1c 4.8 - 5.6 % 5.3 - - - 5.5   PHART 7.350 - 7.450 - - 7.341(L) - -   PCO2ART 32.0 - 48.0 mmHg - - 41.4 - -   HCO3 20.0 - 28.0 mmol/L - - 22.4 - -   TCO2 22 - 32 mmol/L - 23 24 - -   ACIDBASEDEF 0.0 - 2.0 mmol/L - - 3.0(H) - -   O2SAT % - - 99.0 - -       Exercise Target Goals: Exercise Program Goal: Individual exercise prescription set using results from initial 6 min walk test and THRR while considering  patient's activity barriers and safety.   Exercise Prescription Goal: Initial exercise prescription builds to 30-45  minutes a day of aerobic activity, 2-3 days per week.  Home exercise guidelines will be given to patient during program as part of exercise prescription that the participant will acknowledge.   Education: Aerobic Exercise: - Group verbal and visual presentation on the components of exercise prescription. Introduces F.I.T.T principle from ACSM for exercise prescriptions.  Reviews F.I.T.T. principles of aerobic exercise including progression. Written material given at graduation.   Education: Resistance Exercise: - Group verbal and visual presentation on the components of exercise prescription. Introduces F.I.T.T principle from ACSM for exercise prescriptions  Reviews F.I.T.T. principles of resistance exercise including progression. Written material given at graduation.    Education: Exercise & Equipment Safety: - Individual verbal instruction and demonstration of equipment use and safety with use of the equipment. Flowsheet Row Cardiac Rehab from 08/06/2020 in [facility] Cardiac and Pulmonary Rehab  Date 07/26/20  Educator [facility]  Instruction Review Code 1- Verbalizes Understanding      Education: Exercise Physiology & General Exercise Guidelines: - Group verbal and written instruction with models to review the exercise physiology of the cardiovascular system and associated critical values. Provides general exercise guidelines with specific guidelines to those with heart or lung disease.    Education: Flexibility, Balance, Mind/Body Relaxation: - Group verbal and visual presentation with interactive activity on the components of exercise prescription. Introduces F.I.T.T principle from ACSM for exercise prescriptions. Reviews F.I.T.T. principles of flexibility and balance exercise training including progression. Also discusses the mind body connection.  Reviews various relaxation techniques to help reduce and manage stress (i.e. Deep breathing, progressive muscle relaxation, and visualization). Balance  handout provided to take home. Written material given at graduation.   Activity Barriers & Risk Stratification:  Activity Barriers & Cardiac Risk Stratification - 08/06/20 1409      Activity Barriers & Cardiac Risk Stratification   Activity Barriers Deconditioning;Muscular Weakness;Balance Concerns;Shortness of Breath;Decreased Ventricular Function;Other (comment)    Comments occasional delays responding since stroke    Cardiac Risk Stratification High           6 Minute Walk:  6 Minute Walk    Row Name 08/06/20 1408         6 Minute Walk   Phase Initial     Distance 1240 feet     Walk Time 6 minutes     # of Rest Breaks 0     MPH 2.35     METS 3.58     RPE 11     Perceived Dyspnea  1     VO2 Peak 12.52     Symptoms Yes (comment)  Comments hand tingling 2-3/10 (since CVA), SOB     Resting HR 99 bpm     Resting BP 108/66     Resting Oxygen Saturation  100 %     Exercise Oxygen Saturation  during 6 min walk 98 %     Max Ex. HR 128 bpm     Max Ex. BP 146/74     2 Minute Post BP 110/60            Oxygen Initial Assessment:   Oxygen Re-Evaluation:   Oxygen Discharge (Final Oxygen Re-Evaluation):   Initial Exercise Prescription:  Initial Exercise Prescription - 08/06/20 1400      Date of Initial Exercise RX and Referring Provider   Date 08/06/20    Referring Provider [name] MD      Treadmill   MPH 2.3    Grade 1    Minutes 15    METs 3.08      NuStep   Level 3    SPM 80    Minutes 15    METs 3      REL-XR   Level 2    Speed 50    Minutes 15    METs 3      Biostep-RELP   Level 3    SPM 50    Minutes 15    METs 3      Prescription Details   Frequency (times per week) 3    Duration Progress to 30 minutes of continuous aerobic without signs/symptoms of physical distress      Intensity   THRR 40-80% of Max Heartrate 128-157    Ratings of Perceived Exertion 11-13    Perceived Dyspnea 0-4      Progression   Progression  Continue to progress workloads to maintain intensity without signs/symptoms of physical distress.      Resistance Training   Training Prescription Yes    Weight 3 lb    Reps 10-15           Perform Capillary Blood Glucose checks as needed.  Exercise Prescription Changes:  Exercise Prescription Changes    Row Name 08/06/20 1400             Response to Exercise   Blood Pressure (Admit) 108/66       Blood Pressure (Exercise) 146/74       Blood Pressure (Exit) 110/60       Heart Rate (Admit) 99 bpm       Heart Rate (Exercise) 128 bpm       Heart Rate (Exit) 99 bpm       Oxygen Saturation (Admit) 100 %       Oxygen Saturation (Exercise) 98 %       Rating of Perceived Exertion (Exercise) 11       Perceived Dyspnea (Exercise) 1       Symptoms SOB, hand tingling (2-3/10)       Comments walk test results              Exercise Comments:   Exercise Goals and Review:  Exercise Goals    Row Name 08/06/20 1412             Exercise Goals   Increase Physical Activity Yes       Intervention Provide advice, education, support and counseling about physical activity/exercise needs.;Develop an individualized exercise prescription for aerobic and resistive training based on initial evaluation findings, risk stratification, comorbidities and participant's personal goals.  Expected Outcomes Short Term: Attend rehab on a regular basis to increase amount of physical activity.;Long Term: Add in home exercise to make exercise part of routine and to increase amount of physical activity.;Long Term: Exercising regularly at least 3-5 days a week.       Increase Strength and Stamina Yes       Intervention Provide advice, education, support and counseling about physical activity/exercise needs.;Develop an individualized exercise prescription for aerobic and resistive training based on initial evaluation findings, risk stratification, comorbidities and participant's personal goals.        Expected Outcomes Short Term: Increase workloads from initial exercise prescription for resistance, speed, and METs.;Short Term: Perform resistance training exercises routinely during rehab and add in resistance training at home;Long Term: Improve cardiorespiratory fitness, muscular endurance and strength as measured by increased METs and functional capacity (6MWT)       Able to understand and use rate of perceived exertion (RPE) scale Yes       Intervention Provide education and explanation on how to use RPE scale       Expected Outcomes Short Term: Able to use RPE daily in rehab to express subjective intensity level;Long Term:  Able to use RPE to guide intensity level when exercising independently       Able to understand and use Dyspnea scale Yes       Intervention Provide education and explanation on how to use Dyspnea scale       Expected Outcomes Short Term: Able to use Dyspnea scale daily in rehab to express subjective sense of shortness of breath during exertion;Long Term: Able to use Dyspnea scale to guide intensity level when exercising independently       Knowledge and understanding of Target Heart Rate Range (THRR) Yes       Intervention Provide education and explanation of THRR including how the numbers were predicted and where they are located for reference       Expected Outcomes Short Term: Able to state/look up THRR;Short Term: Able to use daily as guideline for intensity in rehab;Long Term: Able to use THRR to govern intensity when exercising independently       Able to check pulse independently Yes       Intervention Provide education and demonstration on how to check pulse in carotid and radial arteries.;Review the importance of being able to check your own pulse for safety during independent exercise       Expected Outcomes Short Term: Able to explain why pulse checking is important during independent exercise;Long Term: Able to check pulse independently and accurately        Understanding of Exercise Prescription Yes       Intervention Provide education, explanation, and written materials on patient's individual exercise prescription       Expected Outcomes Short Term: Able to explain program exercise prescription;Long Term: Able to explain home exercise prescription to exercise independently              Exercise Goals Re-Evaluation :   Discharge Exercise Prescription (Final Exercise Prescription Changes):  Exercise Prescription Changes - 08/06/20 1400      Response to Exercise   Blood Pressure (Admit) 108/66    Blood Pressure (Exercise) 146/74    Blood Pressure (Exit) 110/60    Heart Rate (Admit) 99 bpm    Heart Rate (Exercise) 128 bpm    Heart Rate (Exit) 99 bpm    Oxygen Saturation (Admit) 100 %    Oxygen Saturation (Exercise) 98 %  Rating of Perceived Exertion (Exercise) 11    Perceived Dyspnea (Exercise) 1    Symptoms SOB, hand tingling (2-3/10)    Comments walk test results           Nutrition:  Target Goals: Understanding of nutrition guidelines, daily intake of sodium 1500mg , cholesterol 200mg , calories 30% from fat and 7% or less from saturated fats, daily to have 5 or more servings of fruits and vegetables.  Education: All About Nutrition: -Group instruction provided by verbal, written material, interactive activities, discussions, models, and posters to present general guidelines for heart healthy nutrition including fat, fiber, MyPlate, the role of sodium in heart healthy nutrition, utilization of the nutrition label, and utilization of this knowledge for meal planning. Follow up email sent as well. Written material given at graduation. Flowsheet Row Cardiac Rehab from 08/06/2020 in [facility] Cardiac and Pulmonary Rehab  Education need identified 08/06/20      Biometrics:  Pre Biometrics - 08/06/20 1421      Pre Biometrics   Height 5' 4.1" (1.628 m)    Weight 240 lb (108.9 kg)    BMI (Calculated) 41.07    Single Leg Stand 30  seconds            Nutrition Therapy Plan and Nutrition Goals:  Nutrition Therapy & Goals - 08/06/20 0854      Nutrition Therapy   Diet Heart healthy, low Na    Drug/Food Interactions Statins/Certain Fruits    Protein (specify units) 85g    Fiber 25 grams    Whole Grain Foods 3 servings    Saturated Fats 12 max. grams    Fruits and Vegetables 8 servings/day    Sodium 1.5 grams      Personal Nutrition Goals   Nutrition Goal ST: get back into routine of eating form the wellness center LT: meet kcal and protein needs, increase energy    Comments She reports not eating much right now - textures are hard for her since her stroke. B: milk and protein bar L: salad with protein (chef salad) D: she may or may not eat dinner - 3-4x/week: grilled chicken or pork loin with vegetable and starch ( she reports her husband is a "health nut"). She reports not feeling her hunger cues right now since the stroke. She had been doing the diet from the health and wellness center and now she is not eating much. Her memory has also been poor. The diet was high protein -B: eggs and milk, toast (whole wheat) with a yogurt. L: lean cuisine with salad S: protein bars and string cheese D: 8-10 oz of lean meat, 1-2 cups of vegetables, and 1/2 cup beans. She was monitored by MDs and RDs as part of the program. Reviewed heart healthy eating. She likes the routine she was doing before her surgery and encouraged her to get back to that using mechanical hunger - eating outside of hunger since it is dulled.      Intervention Plan   Intervention Prescribe, educate and counsel regarding individualized specific dietary modifications aiming towards targeted core components such as weight, hypertension, lipid management, diabetes, heart failure and other comorbidities.;Nutrition handout(s) given to patient.    Expected Outcomes Short Term Goal: Understand basic principles of dietary content, such as calories, fat, sodium,  cholesterol and nutrients.;Short Term Goal: A plan has been developed with personal nutrition goals set during dietitian appointment.;Long Term Goal: Adherence to prescribed nutrition plan.  Nutrition Assessments:  MEDIFICTS Score Key:  ?70 Need to make dietary changes   40-70 Heart Healthy Diet  ? 40 Therapeutic Level Cholesterol Diet  Flowsheet Row Cardiac Rehab from 08/06/2020 in [facility] Cardiac and Pulmonary Rehab  Picture Your Plate Total Score on Admission 75     Picture Your Plate Scores:  <30 Unhealthy dietary pattern with much room for improvement.  41-50 Dietary pattern unlikely to meet recommendations for good health and room for improvement.  51-60 More healthful dietary pattern, with some room for improvement.   >60 Healthy dietary pattern, although there may be some specific behaviors that could be improved.    Nutrition Goals Re-Evaluation:   Nutrition Goals Discharge (Final Nutrition Goals Re-Evaluation):   Psychosocial: Target Goals: Acknowledge presence or absence of significant depression and/or stress, maximize coping skills, provide positive support system. Participant is able to verbalize types and ability to use techniques and skills needed for reducing stress and depression.   Education: Stress, Anxiety, and Depression - Group verbal and visual presentation to define topics covered.  Reviews how body is impacted by stress, anxiety, and depression.  Also discusses healthy ways to reduce stress and to treat/manage anxiety and depression.  Written material given at graduation. Flowsheet Row Cardiac Rehab from 08/06/2020 in [facility] Cardiac and Pulmonary Rehab  Education need identified 08/06/20      Education: Sleep Hygiene -Provides group verbal and written instruction about how sleep can affect your health.  Define sleep hygiene, discuss sleep cycles and impact of sleep habits. Review good sleep hygiene tips.    Initial Review & Psychosocial  Screening:  Initial Psych Review & Screening - 07/26/20 0902      Initial Review   Current issues with None Identified      Family Dynamics   Good Support System? Yes    Comments Patient has had a stroke last year and has not done much since then. She can look to her husband, son and daughter for support. She has a somewhat positive outlook on her health.      Barriers   Psychosocial barriers to participate in program The patient should benefit from training in stress management and relaxation.;There are no identifiable barriers or psychosocial needs.      Screening Interventions   Interventions Encouraged to exercise;Provide feedback about the scores to participant;To provide support and resources with identified psychosocial needs           Quality of Life Scores:   Quality of Life - 08/06/20 1421      Quality of Life   Select Quality of Life      Quality of Life Scores   Health/Function Pre 20.57 %    Socioeconomic Pre 29 %    Psych/Spiritual Pre 28.29 %    Family Pre 30 %    GLOBAL Pre 25.19 %          Scores of 19 and below usually indicate a poorer quality of life in these areas.  A difference of  2-3 points is a clinically meaningful difference.  A difference of 2-3 points in the total score of the Quality of Life Index has been associated with significant improvement in overall quality of life, self-image, physical symptoms, and general health in studies assessing change in quality of life.  PHQ-9: Recent Review Flowsheet Data    Depression screen [facility] 2/9 08/06/2020 10/05/2017   Decreased Interest 0 1   Down, Depressed, Hopeless 0 0   PHQ - 2 Score 0  1   Altered sleeping 1 1   Tired, decreased energy 2 2   Change in appetite 1 -   Feeling bad or failure about yourself  0 1   Trouble concentrating 0 0   Moving slowly or fidgety/restless 1 0   Suicidal thoughts 0 0   PHQ-9 Score 5 5   Difficult doing work/chores Not difficult at all Somewhat difficult      Interpretation of Total Score  Total Score Depression Severity:  1-4 = Minimal depression, 5-9 = Mild depression, 10-14 = Moderate depression, 15-19 = Moderately severe depression, 20-27 = Severe depression   Psychosocial Evaluation and Intervention:  Psychosocial Evaluation - 07/26/20 0904      Psychosocial Evaluation & Interventions   Interventions Encouraged to exercise with the program and follow exercise prescription;Stress management education;Relaxation education    Comments Patient has had a stroke last year and has not done much since then. She can look to her husband, son and daughter for support. She has a somewhat positive outlook on her health.    Expected Outcomes Short: Exercise regularly to support mental health and notify staff of any changes. Long: maintain mental health and well being through teaching of rehab or prescribed medications independently.    Continue Psychosocial Services  Follow up required by staff           Psychosocial Re-Evaluation:   Psychosocial Discharge (Final Psychosocial Re-Evaluation):   Vocational Rehabilitation: Provide vocational rehab assistance to qualifying candidates.   Vocational Rehab Evaluation & Intervention:   Education: Education Goals: Education classes will be provided on a variety of topics geared toward better understanding of heart health and risk factor modification. Participant will state understanding/return demonstration of topics presented as noted by education test scores.  Learning Barriers/Preferences:  Learning Barriers/Preferences - 07/26/20 0901      Learning Barriers/Preferences   Learning Barriers None    Learning Preferences None           General Cardiac Education Topics:  AED/CPR: - Group verbal and written instruction with the use of models to demonstrate the basic use of the AED with the basic ABC's of resuscitation.   Anatomy and Cardiac Procedures: - Group verbal and visual  presentation and models provide information about basic cardiac anatomy and function. Reviews the testing methods done to diagnose heart disease and the outcomes of the test results. Describes the treatment choices: Medical Management, Angioplasty, or Coronary Bypass Surgery for treating various heart conditions including Myocardial Infarction, Angina, Valve Disease, and Cardiac Arrhythmias.  Written material given at graduation. Flowsheet Row Cardiac Rehab from 08/06/2020 in [facility] Cardiac and Pulmonary Rehab  Education need identified 08/06/20      Medication Safety: - Group verbal and visual instruction to review commonly prescribed medications for heart and lung disease. Reviews the medication, class of the drug, and side effects. Includes the steps to properly store meds and maintain the prescription regimen.  Written material given at graduation.   Intimacy: - Group verbal instruction through game format to discuss how heart and lung disease can affect sexual intimacy. Written material given at graduation..   Know Your Numbers and Heart Failure: - Group verbal and visual instruction to discuss disease risk factors for cardiac and pulmonary disease and treatment options.  Reviews associated critical values for Overweight/Obesity, Hypertension, Cholesterol, and Diabetes.  Discusses basics of heart failure: signs/symptoms and treatments.  Introduces Heart Failure Zone chart for action plan for heart failure.  Written material given at graduation.  Infection Prevention: - Provides verbal and written material to individual with discussion of infection control including proper hand washing and proper equipment cleaning during exercise session. Flowsheet Row Cardiac Rehab from 08/06/2020 in [facility] Cardiac and Pulmonary Rehab  Date 07/26/20  Educator [facility]  Instruction Review Code 1- Verbalizes Understanding      Falls Prevention: - Provides verbal and written material to individual with discussion  of falls prevention and safety. Flowsheet Row Cardiac Rehab from 08/06/2020 in [facility] Cardiac and Pulmonary Rehab  Date 07/26/20  Educator [facility]  Instruction Review Code 1- Verbalizes Understanding      Other: -Provides group and verbal instruction on various topics (see comments)   Knowledge Questionnaire Score:  Knowledge Questionnaire Score - 08/06/20 1422      Knowledge Questionnaire Score   Pre Score 22/26 Education Focus: Depression, Angina, and Nutrition           Core Components/Risk Factors/Patient Goals at Admission:  Personal Goals and Risk Factors at Admission - 08/06/20 1423      Core Components/Risk Factors/Patient Goals on Admission    Weight Management Yes;Weight Loss;Obesity    Intervention Weight Management: Develop a combined nutrition and exercise program designed to reach desired caloric intake, while maintaining appropriate intake of nutrient and fiber, sodium and fats, and appropriate energy expenditure required for the weight goal.;Weight Management: Provide education and appropriate resources to help participant work on and attain dietary goals.;Weight Management/Obesity: Establish reasonable short term and long term weight goals.;Obesity: Provide education and appropriate resources to help participant work on and attain dietary goals.    Admit Weight 240 lb (108.9 kg)    Goal Weight: Short Term 235 lb (106.6 kg)    Goal Weight: Long Term 230 lb (104.3 kg)    Expected Outcomes Short Term: Continue to assess and modify interventions until short term weight is achieved;Long Term: Adherence to nutrition and physical activity/exercise program aimed toward attainment of established weight goal;Weight Loss: Understanding of general recommendations for a balanced deficit meal plan, which promotes 1-2 lb weight loss per week and includes a negative energy balance of [phone] kcal/d;Understanding recommendations for meals to include 15-35% energy as protein, 25-35% energy  from fat, 35-60% energy from carbohydrates, less than 200mg  of dietary cholesterol, 20-35 gm of total fiber daily;Understanding of distribution of calorie intake throughout the day with the consumption of 4-5 meals/snacks    Heart Failure Yes    Intervention Provide a combined exercise and nutrition program that is supplemented with education, support and counseling about heart failure. Directed toward relieving symptoms such as shortness of breath, decreased exercise tolerance, and extremity edema.    Expected Outcomes Improve functional capacity of life;Short term: Attendance in program 2-3 days a week with increased exercise capacity. Reported lower sodium intake. Reported increased fruit and vegetable intake. Reports medication compliance.;Short term: Daily weights obtained and reported for increase. Utilizing diuretic protocols set by physician.;Long term: Adoption of self-care skills and reduction of barriers for early signs and symptoms recognition and intervention leading to self-care maintenance.    Hypertension Yes    Intervention Monitor prescription use compliance.;Provide education on lifestyle modifcations including regular physical activity/exercise, weight management, moderate sodium restriction and increased consumption of fresh fruit, vegetables, and low fat dairy, alcohol moderation, and smoking cessation.    Expected Outcomes Short Term: Continued assessment and intervention until BP is < 140/72mm HG in hypertensive participants. < 130/43mm HG in hypertensive participants with diabetes, heart failure or chronic kidney disease.;Long Term: Maintenance of blood pressure  at goal levels.    Lipids Yes    Intervention Provide education and support for participant on nutrition & aerobic/resistive exercise along with prescribed medications to achieve LDL 70mg , HDL >40mg .    Expected Outcomes Short Term: Participant states understanding of desired cholesterol values and is compliant with  medications prescribed. Participant is following exercise prescription and nutrition guidelines.;Long Term: Cholesterol controlled with medications as prescribed, with individualized exercise RX and with personalized nutrition plan. Value goals: LDL < 70mg , HDL > 40 mg.           Education:Diabetes - Individual verbal and written instruction to review signs/symptoms of diabetes, desired ranges of glucose level fasting, after meals and with exercise. Acknowledge that pre and post exercise glucose checks will be done for 3 sessions at entry of program.   Core Components/Risk Factors/Patient Goals Review:    Core Components/Risk Factors/Patient Goals at Discharge (Final Review):    ITP Comments:  ITP Comments    Row Name 07/26/20 0906 08/06/20 1408         ITP Comments Virtual Visit completed. Patient informed on EP and RD appointment and 6 Minute walk test. Patient also informed of patient health questionnaires on My Chart. Patient Verbalizes understanding. Visit diagnosis can be found in [facility] 07/23/2020. Completed 6MWT and gym orientation. Initial ITP created and sent for review to Dr. Emily Filbert, Medical Director.             Comments: Initial ITP

## 2020-08-07 ENCOUNTER — Ambulatory Visit (HOSPITAL_COMMUNITY)
Admission: RE | Admit: 2020-08-07 | Discharge: 2020-08-07 | Disposition: A | Discharge status: Home / Self Care | Payer: BC Managed Care – PPO | Source: Ambulatory Visit | Attending: Cardiology | Admitting: Cardiology

## 2020-08-07 ENCOUNTER — Other Ambulatory Visit: Payer: Self-pay

## 2020-08-07 DIAGNOSIS — I5022 Chronic systolic (congestive) heart failure: Secondary | ICD-10-CM | POA: Insufficient documentation

## 2020-08-07 LAB — BASIC METABOLIC PANEL
Anion gap: 11 (ref 5–15)
BUN: 16 mg/dL (ref 6–20)
CO2: 24 mmol/L (ref 22–32)
Calcium: 9.4 mg/dL (ref 8.9–10.3)
Chloride: 103 mmol/L (ref 98–111)
Creatinine, Ser: 1.15 mg/dL — ABNORMAL HIGH (ref 0.44–1.00)
GFR, Estimated: 58 mL/min — ABNORMAL LOW (ref >60)
Glucose, Bld: 95 mg/dL (ref 70–99)
Potassium: 3.9 mmol/L (ref 3.5–5.1)
Sodium: 138 mmol/L (ref 135–145)

## 2020-08-07 LAB — TSH: TSH: 4.066 u[IU]/mL (ref 0.350–4.500)

## 2020-08-07 LAB — T4, FREE: Free T4: 1.17 ng/dL — ABNORMAL HIGH (ref 0.61–1.12)

## 2020-08-08 DIAGNOSIS — I5022 Chronic systolic (congestive) heart failure: Secondary | ICD-10-CM

## 2020-08-08 LAB — T3, FREE: T3, Free: 2.6 pg/mL (ref 2.0–4.4)

## 2020-08-08 NOTE — Progress Notes (Signed)
Daily Session Note  Patient Details  Name: [name] MRN: [id number] Date of Birth: 1970-12-08 Referring Provider:   Flowsheet Row Cardiac Rehab from 08/06/2020 in [facility] Cardiac and Pulmonary Rehab  Referring Provider [name] MD      Encounter Date: 08/08/2020  Check In:  Session Check In - 08/08/20 0727      Check-In   Supervising physician immediately available to respond to emergencies See telemetry face sheet for immediately available ER MD    Location [facility]-Cardiac & Pulmonary Rehab    Staff Present Birdie Sons, MPA, RN;Amanda Oletta Darter, BA, ACSM CEP, Exercise Physiologist;Joseph Tessie Fass RCP,RRT,BSRT    Virtual Visit No    Medication changes reported     No    Fall or balance concerns reported    No    Warm-up and Cool-down Performed on first and last piece of equipment    Resistance Training Performed Yes    VAD Patient? No    PAD/SET Patient? No      Pain Assessment   Currently in Pain? No/denies              Social History   Tobacco Use  Smoking Status Never Smoker  Smokeless Tobacco Never Used    Goals Met:  Independence with exercise equipment Exercise tolerated well No report of cardiac concerns or symptoms Strength training completed today  Goals Unmet:  Not Applicable  Comments: First full day of exercise!  Patient was oriented to gym and equipment including functions, settings, policies, and procedures.  Patient's individual exercise prescription and treatment plan were reviewed.  All starting workloads were established based on the results of the 6 minute walk test done at initial orientation visit.  The plan for exercise progression was also introduced and progression will be customized based on patient's performance and goals.    Dr. Emily Filbert is Medical Director for [facility].

## 2020-08-10 ENCOUNTER — Other Ambulatory Visit: Payer: Self-pay

## 2020-08-10 DIAGNOSIS — I5022 Chronic systolic (congestive) heart failure: Secondary | ICD-10-CM

## 2020-08-10 NOTE — Progress Notes (Signed)
Daily Session Note  Patient Details  Name: [name] MRN: [id number] Date of Birth: [birth year] Referring Provider:   Flowsheet Row Cardiac Rehab from 08/06/2020 in [facility] Cardiac and Pulmonary Rehab  Referring Provider [name] MD      Encounter Date: 08/10/2020  Check In:  Session Check In - 08/10/20 0724      Check-In   Supervising physician immediately available to respond to emergencies See telemetry face sheet for immediately available ER MD    Location [facility]-Cardiac & Pulmonary Rehab    Staff Present Birdie Sons, MPA, RN;Joseph Darrin Nipper, [place], RCEP, CCRP, CCET    Virtual Visit No    Medication changes reported     No    Fall or balance concerns reported    No    Warm-up and Cool-down Performed on first and last piece of equipment    Resistance Training Performed Yes    VAD Patient? No    PAD/SET Patient? No      Pain Assessment   Currently in Pain? No/denies              Social History   Tobacco Use  Smoking Status Never Smoker  Smokeless Tobacco Never Used    Goals Met:  Independence with exercise equipment Exercise tolerated well No report of cardiac concerns or symptoms Strength training completed today  Goals Unmet:  Not Applicable  Comments: Pt able to follow exercise prescription today without complaint.  Will continue to monitor for progression.    Dr. Emily Filbert is Medical Director for [facility].

## 2020-08-13 ENCOUNTER — Telehealth: Payer: Self-pay

## 2020-08-13 DIAGNOSIS — I472 Ventricular tachycardia, unspecified: Secondary | ICD-10-CM

## 2020-08-13 MED ORDER — AMIODARONE HCL 200 MG PO TABS
ORAL_TABLET | ORAL | 3 refills | Status: DC
Start: 2020-08-13 — End: 2020-08-30

## 2020-08-13 MED ORDER — MEXILETINE HCL 150 MG PO CAPS: 150.0000 mg | ORAL_CAPSULE | Freq: Two times a day (BID) | ORAL | 3 refills | Status: DC

## 2020-08-13 NOTE — Telephone Encounter (Signed)
Spoke with pt, advised that Dr. [name] has reached out to collegues regarding possible ablation procedure.  In meantime, increase Amiodarone to 400mg  BID X14 days, 400mg  Daily X14 days followed by 200 mg daily.    Start Mexiletine 150mg  BID.    Updated prescriptions sent to pharmacy.

## 2020-08-13 NOTE — Telephone Encounter (Signed)
Hard to induce, hard to map, and hard to ablate. Did you consider a trial of quinidine? GT

## 2020-08-13 NOTE — Telephone Encounter (Signed)
[name] lets do 2 things in the short term 1) increaseamio to 400 bid x 2 weeks then 400 daily x 2 weeks then 200 daily 2) begin mex 150 bid   Tell her also I have reached out to colleagues to see if there is a possibile procedural ( ie not drug) solution

## 2020-08-13 NOTE — Telephone Encounter (Signed)
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

## 2020-08-13 NOTE — Telephone Encounter (Signed)
Transmission received showing treated VT episode on 2/21 at 1:41am.  Pt reports at time of event she was laying in bed watching TV, she denies any activity prior to event.  She suddenly felt like something was not right just prior to receiving the shock.  Afterwards she was nervous that it might happen again.  She did have some mild chest pains that went away on their own.    Pt reports compliance with meds as ordered including- Amiodarone 200mg  daily, Carvedilol 6.25mg  BID, Digoxin 0.0625 mg daily  Educated patient on shock plan and DMV driving restrictions.  Pt reports she just recently got off restriction from previous treated event.

## 2020-08-13 NOTE — Telephone Encounter (Signed)
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

## 2020-08-15 ENCOUNTER — Other Ambulatory Visit (HOSPITAL_COMMUNITY): Payer: Self-pay | Admitting: Interventional Radiology

## 2020-08-15 ENCOUNTER — Encounter: Payer: Self-pay | Admitting: *Deleted

## 2020-08-15 DIAGNOSIS — I5022 Chronic systolic (congestive) heart failure: Secondary | ICD-10-CM

## 2020-08-15 DIAGNOSIS — I639 Cerebral infarction, unspecified: Secondary | ICD-10-CM

## 2020-08-15 NOTE — Progress Notes (Signed)
Cardiac Individual Treatment Plan  Patient Details  Name: [name] MRN: [id number] Date of Birth: 1970/11/02 Referring Provider:   Flowsheet Row Cardiac Rehab from 08/06/2020 in [facility] Cardiac and Pulmonary Rehab  Referring Provider [name] MD      Initial Encounter Date:  Flowsheet Row Cardiac Rehab from 08/06/2020 in [facility] Cardiac and Pulmonary Rehab  Date 08/06/20      Visit Diagnosis: Heart failure, chronic systolic ([place])  Patient's Home Medications on Admission:  Current Outpatient Medications:    amiodarone (PACERONE) 200 MG tablet, Take 2 tablets (400 mg total) by mouth 2 (two) times daily for 14 days, THEN 2 tablets (400 mg total) daily for 14 days, THEN 1 tablet (200 mg total) daily., Disp: 90 tablet, Rfl: 3   aspirin 81 MG chewable tablet, Chew 1 tablet (81 mg total) by mouth daily., Disp: 30 tablet, Rfl: 11   atorvastatin (LIPITOR) 40 MG tablet, Take 40 mg by mouth daily., Disp: , Rfl:    carvedilol (COREG) 6.25 MG tablet, TAKE 1 TABLET BY MOUTH TWICE A DAY WITH A MEAL., Disp: 60 tablet, Rfl: 0   digoxin (LANOXIN) 0.125 MG tablet, Take 0.5 tablets (0.0625 mg total) by mouth daily., Disp: 30 tablet, Rfl: 11   FARXIGA 10 MG TABS tablet, TAKE 1 TABLET BY MOUTH ONCE A DAY BEFOREBREAKFAST, Disp: 30 tablet, Rfl: 5   levothyroxine (SYNTHROID) 25 MCG tablet, Take 1 tablet (25 mcg total) by mouth daily before breakfast., Disp: 90 tablet, Rfl: 3   metolazone (ZAROXOLYN) 2.5 MG tablet, Take 1 tablet (2.5 mg total) by mouth as needed (extra fluid as directed by HF clinic)., Disp: 5 tablet, Rfl: 3   mexiletine (MEXITIL) 150 MG capsule, Take 1 capsule (150 mg total) by mouth 2 (two) times daily., Disp: 60 capsule, Rfl: 3   potassium chloride SA (KLOR-CON) 20 MEQ tablet, Take 20 mEq by mouth 2 (two) times daily., Disp: , Rfl:    sacubitril-valsartan (ENTRESTO) 24-26 MG, Take 1 tablet by mouth 2 (two) times daily., Disp: 60 tablet, Rfl: 3   spironolactone (ALDACTONE) 25  MG tablet, Take 25 mg by mouth daily., Disp: , Rfl:    ticagrelor (BRILINTA) 90 MG TABS tablet, Take 1 tablet (90 mg total) by mouth 2 (two) times daily., Disp: 60 tablet, Rfl: 11   torsemide (DEMADEX) 20 MG tablet, TAKE 4 TABLETS (80 MG) BY MOUTH IN THE MORNING., Disp: 90 tablet, Rfl: 0   Vitamin D, Ergocalciferol, (DRISDOL) 1.25 MG (50000 UNIT) CAPS capsule, Take 1 capsule (50,000 Units total) by mouth every 7 (seven) days., Disp: 4 capsule, Rfl: 0  Past Medical History: Past Medical History:  Diagnosis Date   Automatic implantable cardioverter-defibrillator in situ    Chronic CHF (congestive heart failure) (HCC)    a. EF 15-20% b. RHC (09/2013) RA 14, RV 57/22, PA 64/36 (48), PCWP 18, FIck CO/CI 3.7/1.6, PVR 8.1 WU, PA sat 47%    History of stomach ulcers    Hypertension    Hypotension    Hypothyroidism    Morbid obesity ([place])    Myocardial infarction ([place]) 08/2013   Nocturnal dyspnea    Nonischemic cardiomyopathy (HCC)    Sinus tachycardia    Snoring-prob OSA 09/04/2011   [employer] ICD lead RECALL  6949    UARS (upper airway resistance syndrome) 09/04/2011   HST 12/2013:  AHI 4/hr (numerous episodes of airflow reduction that did not have concomitant desaturation)     Tobacco Use: Social History   Tobacco Use  Smoking Status Never Smoker  Smokeless Tobacco Never Used    Labs: Recent Review Flowsheet Data    Labs for ITP Cardiac and Pulmonary Rehab Latest Ref Rng & Units 12/01/2019 04/17/2020 04/17/2020 04/17/2020 04/18/2020   Cholestrol 0 - 200 mg/dL - - - - 198   LDLCALC 0 - 99 mg/dL - - - - 130(H)   HDL >40 mg/dL - - - - 50   Trlycerides <150 mg/dL - - - 97 92   Hemoglobin A1c 4.8 - 5.6 % 5.3 - - - 5.5   PHART 7.350 - 7.450 - - 7.341(L) - -   PCO2ART 32.0 - 48.0 mmHg - - 41.4 - -   HCO3 20.0 - 28.0 mmol/L - - 22.4 - -   TCO2 22 - 32 mmol/L - 23 24 - -   ACIDBASEDEF 0.0 - 2.0 mmol/L - - 3.0(H) - -   O2SAT % - - 99.0 - -       Exercise Target  Goals: Exercise Program Goal: Individual exercise prescription set using results from initial 6 min walk Clark and THRR while considering  patients activity barriers and safety.   Exercise Prescription Goal: Initial exercise prescription builds to 30-45 minutes a day of aerobic activity, 2-3 days per week.  Home exercise guidelines will be given to patient during program as part of exercise prescription that the participant will acknowledge.   Education: Aerobic Exercise: - Group verbal and visual presentation on the components of exercise prescription. Introduces F.I.T.T principle from ACSM for exercise prescriptions.  Reviews F.I.T.T. principles of aerobic exercise including progression. Written material given at graduation. Flowsheet Row Cardiac Rehab from 08/08/2020 in [facility] Cardiac and Pulmonary Rehab  Date 08/08/20  Educator [facility]  Instruction Review Code 1- Verbalizes Understanding      Education: Resistance Exercise: - Group verbal and visual presentation on the components of exercise prescription. Introduces F.I.T.T principle from ACSM for exercise prescriptions  Reviews F.I.T.T. principles of resistance exercise including progression. Written material given at graduation.    Education: Exercise & Equipment Safety: - Individual verbal instruction and demonstration of equipment use and safety with use of the equipment. Flowsheet Row Cardiac Rehab from 08/08/2020 in [facility] Cardiac and Pulmonary Rehab  Date 07/26/20  Educator [facility]  Instruction Review Code 1- Verbalizes Understanding      Education: Exercise Physiology & General Exercise Guidelines: - Group verbal and written instruction with models to review the exercise physiology of the cardiovascular system and associated critical values. Provides general exercise guidelines with specific guidelines to those with heart or lung disease.    Education: Flexibility, Balance, Mind/Body Relaxation: - Group verbal and visual presentation  with interactive activity on the components of exercise prescription. Introduces F.I.T.T principle from ACSM for exercise prescriptions. Reviews F.I.T.T. principles of flexibility and balance exercise training including progression. Also discusses the mind body connection.  Reviews various relaxation techniques to help reduce and manage stress (i.e. Deep breathing, progressive muscle relaxation, and visualization). Balance handout provided to take home. Written material given at graduation.   Activity Barriers & Risk Stratification:  Activity Barriers & Cardiac Risk Stratification - 08/06/20 1409      Activity Barriers & Cardiac Risk Stratification   Activity Barriers Deconditioning;Muscular Weakness;Balance Concerns;Shortness of Breath;Decreased Ventricular Function;Other (comment)    Comments occasional delays responding since stroke    Cardiac Risk Stratification High           6 Minute Walk:  6 Minute Walk    Row Name 08/06/20 1408  6 Minute Walk   Phase Initial     Distance 1240 feet     Walk Time 6 minutes     # of Rest Breaks 0     MPH 2.35     METS 3.58     RPE 11     Perceived Dyspnea  1     VO2 Peak 12.52     Symptoms Yes (comment)     Comments hand tingling 2-3/10 (since CVA), SOB     Resting HR 99 bpm     Resting BP 108/66     Resting Oxygen Saturation  100 %     Exercise Oxygen Saturation  during 6 min walk 98 %     Max Ex. HR 128 bpm     Max Ex. BP 146/74     2 Minute Post BP 110/60            Oxygen Initial Assessment:   Oxygen Re-Evaluation:   Oxygen Discharge (Final Oxygen Re-Evaluation):   Initial Exercise Prescription:  Initial Exercise Prescription - 08/06/20 1400      Date of Initial Exercise RX and Referring Provider   Date 08/06/20    Referring Provider [name] MD      Treadmill   MPH 2.3    Grade 1    Minutes 15    METs 3.08      NuStep   Level 3    SPM 80    Minutes 15    METs 3      REL-XR   Level 2     Speed 50    Minutes 15    METs 3      Biostep-RELP   Level 3    SPM 50    Minutes 15    METs 3      Prescription Details   Frequency (times per week) 3    Duration Progress to 30 minutes of continuous aerobic without signs/symptoms of physical distress      Intensity   THRR 40-80% of Max Heartrate 128-157    Ratings of Perceived Exertion 11-13    Perceived Dyspnea 0-4      Progression   Progression Continue to progress workloads to maintain intensity without signs/symptoms of physical distress.      Resistance Training   Training Prescription Yes    Weight 3 lb    Reps 10-15           Perform Capillary Blood Glucose checks as needed.  Exercise Prescription Changes:  Exercise Prescription Changes    Row Name 08/06/20 1400 08/14/20 0800           Response to Exercise   Blood Pressure (Admit) 108/66 144/74      Blood Pressure (Exercise) 146/74 140/64      Blood Pressure (Exit) 110/60 124/70      Heart Rate (Admit) 99 bpm 104 bpm      Heart Rate (Exercise) 128 bpm 138 bpm      Heart Rate (Exit) 99 bpm 117 bpm      Oxygen Saturation (Admit) 100 % --      Oxygen Saturation (Exercise) 98 % --      Rating of Perceived Exertion (Exercise) 11 12      Perceived Dyspnea (Exercise) 1 --      Symptoms SOB, hand tingling (2-3/10) none      Comments walk Clark results second full day of exercise      Duration -- Continue with  30 min of aerobic exercise without signs/symptoms of physical distress.      Intensity -- THRR unchanged             Progression   Progression -- Continue to progress workloads to maintain intensity without signs/symptoms of physical distress.      Average METs -- 2.5             Resistance Training   Training Prescription -- Yes      Weight -- 3 lb      Reps -- 10-15             Interval Training   Interval Training -- No             Treadmill   MPH -- 2.3      Grade -- 1      Minutes -- 15      METs -- 3.08             NuStep    Level -- 3      Minutes -- 15      METs -- 2.6             REL-XR   Level -- 2      Minutes -- 15      METs -- 2.3             Biostep-RELP   Level -- 2      Minutes -- 15      METs -- 2             Exercise Comments:  Exercise Comments    Row Name 08/08/20 0728           Exercise Comments First full day of exercise!  Patient was oriented to gym and equipment including functions, settings, policies, and procedures.  Patient's individual exercise prescription and treatment plan were reviewed.  All starting workloads were established based on the results of the 6 minute walk Clark done at initial orientation visit.  The plan for exercise progression was also introduced and progression will be customized based on patient's performance and goals.              Exercise Goals and Review:  Exercise Goals    Row Name 08/06/20 1412             Exercise Goals   Increase Physical Activity Yes       Intervention Provide advice, education, support and counseling about physical activity/exercise needs.;Develop an individualized exercise prescription for aerobic and resistive training based on initial evaluation findings, risk stratification, comorbidities and participant's personal goals.       Expected Outcomes Short Term: Attend rehab on a regular basis to increase amount of physical activity.;Long Term: Add in home exercise to make exercise part of routine and to increase amount of physical activity.;Long Term: Exercising regularly at least 3-5 days a week.       Increase Strength and Stamina Yes       Intervention Provide advice, education, support and counseling about physical activity/exercise needs.;Develop an individualized exercise prescription for aerobic and resistive training based on initial evaluation findings, risk stratification, comorbidities and participant's personal goals.       Expected Outcomes Short Term: Increase workloads from initial exercise prescription for  resistance, speed, and METs.;Short Term: Perform resistance training exercises routinely during rehab and add in resistance training at home;Long Term: Improve cardiorespiratory fitness, muscular endurance and strength as measured by increased METs and functional  capacity (6MWT)       Able to understand and use rate of perceived exertion (RPE) scale Yes       Intervention Provide education and explanation on how to use RPE scale       Expected Outcomes Short Term: Able to use RPE daily in rehab to express subjective intensity level;Long Term:  Able to use RPE to guide intensity level when exercising independently       Able to understand and use Dyspnea scale Yes       Intervention Provide education and explanation on how to use Dyspnea scale       Expected Outcomes Short Term: Able to use Dyspnea scale daily in rehab to express subjective sense of shortness of breath during exertion;Long Term: Able to use Dyspnea scale to guide intensity level when exercising independently       Knowledge and understanding of Target Heart Rate Range (THRR) Yes       Intervention Provide education and explanation of THRR including how the numbers were predicted and where they are located for reference       Expected Outcomes Short Term: Able to state/look up THRR;Short Term: Able to use daily as guideline for intensity in rehab;Long Term: Able to use THRR to govern intensity when exercising independently       Able to check pulse independently Yes       Intervention Provide education and demonstration on how to check pulse in carotid and radial arteries.;Review the importance of being able to check your own pulse for safety during independent exercise       Expected Outcomes Short Term: Able to explain why pulse checking is important during independent exercise;Long Term: Able to check pulse independently and accurately       Understanding of Exercise Prescription Yes       Intervention Provide education, explanation,  and written materials on patient's individual exercise prescription       Expected Outcomes Short Term: Able to explain program exercise prescription;Long Term: Able to explain home exercise prescription to exercise independently              Exercise Goals Re-Evaluation :  Exercise Goals Re-Evaluation    Row Name 08/08/20 0728 08/14/20 0828           Exercise Goal Re-Evaluation   Exercise Goals Review Increase Physical Activity;Able to understand and use rate of perceived exertion (RPE) scale;Understanding of Exercise Prescription;Knowledge and understanding of Target Heart Rate Range (THRR);Increase Strength and Stamina;Able to understand and use Dyspnea scale;Able to check pulse independently Increase Physical Activity;Increase Strength and Stamina;Understanding of Exercise Prescription      Comments Reviewed RPE and dyspnea scales, THR and program prescription with pt today.  Pt voiced understanding and was given a copy of goals to take home. Shailynn is off to a good start in rehab.  She has completed her first two full days of exercise.  We will continue to montior her progress.      Expected Outcomes Short: Use RPE daily to regulate intensity. Long: Follow program prescription in THR. Short: Continue to attend rehab regularly  Long: Continue to follow program prescription.             Discharge Exercise Prescription (Final Exercise Prescription Changes):  Exercise Prescription Changes - 08/14/20 0800      Response to Exercise   Blood Pressure (Admit) 144/74    Blood Pressure (Exercise) 140/64    Blood Pressure (Exit) 124/70  Heart Rate (Admit) 104 bpm    Heart Rate (Exercise) 138 bpm    Heart Rate (Exit) 117 bpm    Rating of Perceived Exertion (Exercise) 12    Symptoms none    Comments second full day of exercise    Duration Continue with 30 min of aerobic exercise without signs/symptoms of physical distress.    Intensity THRR unchanged      Progression   Progression  Continue to progress workloads to maintain intensity without signs/symptoms of physical distress.    Average METs 2.5      Resistance Training   Training Prescription Yes    Weight 3 lb    Reps 10-15      Interval Training   Interval Training No      Treadmill   MPH 2.3    Grade 1    Minutes 15    METs 3.08      NuStep   Level 3    Minutes 15    METs 2.6      REL-XR   Level 2    Minutes 15    METs 2.3      Biostep-RELP   Level 2    Minutes 15    METs 2           Nutrition:  Target Goals: Understanding of nutrition guidelines, daily intake of sodium 1500mg , cholesterol 200mg , calories 30% from fat and 7% or less from saturated fats, daily to have 5 or more servings of fruits and vegetables.  Education: All About Nutrition: -Group instruction provided by verbal, written material, interactive activities, discussions, models, and posters to present general guidelines for heart healthy nutrition including fat, fiber, MyPlate, the role of sodium in heart healthy nutrition, utilization of the nutrition label, and utilization of this knowledge for meal planning. Follow up email sent as well. Written material given at graduation. Flowsheet Row Cardiac Rehab from 08/08/2020 in [facility] Cardiac and Pulmonary Rehab  Education need identified 08/06/20      Biometrics:  Pre Biometrics - 08/06/20 1421      Pre Biometrics   Height 5' 4.1" (1.628 m)    Weight 240 lb (108.9 kg)    BMI (Calculated) 41.07    Single Leg Stand 30 seconds            Nutrition Therapy Plan and Nutrition Goals:  Nutrition Therapy & Goals - 08/06/20 0854      Nutrition Therapy   Diet Heart healthy, low Na    Drug/Food Interactions Statins/Certain Fruits    Protein (specify units) 85g    Fiber 25 grams    Whole Grain Foods 3 servings    Saturated Fats 12 max. grams    Fruits and Vegetables 8 servings/day    Sodium 1.5 grams      Personal Nutrition Goals   Nutrition Goal ST: get back into  routine of eating form the wellness center LT: meet kcal and protein needs, increase energy    Comments She reports not eating much right now - textures are hard for her since her stroke. B: milk and protein bar L: salad with protein (chef salad) D: she may or may not eat dinner - 3-4x/week: grilled chicken or pork loin with vegetable and starch ( she reports her husband is a "health nut"). She reports not feeling her hunger cues right now since the stroke. She had been doing the diet from the health and wellness center and now she is not eating much. Her  memory has also been poor. The diet was high protein -B: eggs and milk, toast (whole wheat) with a yogurt. L: lean cuisine with salad S: protein bars and string cheese D: 8-10 oz of lean meat, 1-2 cups of vegetables, and 1/2 cup beans. She was monitored by MDs and RDs as part of the program. Reviewed heart healthy eating. She likes the routine she was doing before her surgery and encouraged her to get back to that using mechanical hunger - eating outside of hunger since it is dulled.      Intervention Plan   Intervention Prescribe, educate and counsel regarding individualized specific dietary modifications aiming towards targeted core components such as weight, hypertension, lipid management, diabetes, heart failure and other comorbidities.;Nutrition handout(s) given to patient.    Expected Outcomes Short Term Goal: Understand basic principles of dietary content, such as calories, fat, sodium, cholesterol and nutrients.;Short Term Goal: A plan has been developed with personal nutrition goals set during dietitian appointment.;Long Term Goal: Adherence to prescribed nutrition plan.           Nutrition Assessments:  MEDIFICTS Score Key:  ?70 Need to make dietary changes   40-70 Heart Healthy Diet  ? 40 Therapeutic Level Cholesterol Diet  Flowsheet Row Cardiac Rehab from 08/06/2020 in [facility] Cardiac and Pulmonary Rehab  Picture Your Plate Total Score  on Admission 75     Picture Your Plate Scores:  <74 Unhealthy dietary pattern with much room for improvement.  41-50 Dietary pattern unlikely to meet recommendations for good health and room for improvement.  51-60 More healthful dietary pattern, with some room for improvement.   >60 Healthy dietary pattern, although there may be some specific behaviors that could be improved.    Nutrition Goals Re-Evaluation:   Nutrition Goals Discharge (Final Nutrition Goals Re-Evaluation):   Psychosocial: Target Goals: Acknowledge presence or absence of significant depression and/or stress, maximize coping skills, provide positive support system. Participant is able to verbalize types and ability to use techniques and skills needed for reducing stress and depression.   Education: Stress, Anxiety, and Depression - Group verbal and visual presentation to define topics covered.  Reviews how body is impacted by stress, anxiety, and depression.  Also discusses healthy ways to reduce stress and to treat/manage anxiety and depression.  Written material given at graduation. Flowsheet Row Cardiac Rehab from 08/08/2020 in [facility] Cardiac and Pulmonary Rehab  Education need identified 08/06/20      Education: Sleep Hygiene -Provides group verbal and written instruction about how sleep can affect your health.  Define sleep hygiene, discuss sleep cycles and impact of sleep habits. Review good sleep hygiene tips.    Initial Review & Psychosocial Screening:  Initial Psych Review & Screening - 07/26/20 0902      Initial Review   Current issues with None Identified      Family Dynamics   Good Support System? Yes    Comments Patient has had a stroke last year and has not done much since then. She can look to her husband, son and daughter for support. She has a somewhat positive outlook on her health.      Barriers   Psychosocial barriers to participate in program The patient should benefit from training in  stress management and relaxation.;There are no identifiable barriers or psychosocial needs.      Screening Interventions   Interventions Encouraged to exercise;Provide feedback about the scores to participant;To provide support and resources with identified psychosocial needs  Quality of Life Scores:   Quality of Life - 08/06/20 1421      Quality of Life   Select Quality of Life      Quality of Life Scores   Health/Function Pre 20.57 %    Socioeconomic Pre 29 %    Psych/Spiritual Pre 28.29 %    Family Pre 30 %    GLOBAL Pre 25.19 %          Scores of 19 and below usually indicate a poorer quality of life in these areas.  A difference of  2-3 points is a clinically meaningful difference.  A difference of 2-3 points in the total score of the Quality of Life Index has been associated with significant improvement in overall quality of life, self-image, physical symptoms, and general health in studies assessing change in quality of life.  PHQ-9: Recent Review Flowsheet Data    Depression screen [facility] 2/9 08/06/2020 10/05/2017   Decreased Interest 0 1   Down, Depressed, Hopeless 0 0   PHQ - 2 Score 0 1   Altered sleeping 1 1   Tired, decreased energy 2 2   Change in appetite 1 -   Feeling bad or failure about yourself  0 1   Trouble concentrating 0 0   Moving slowly or fidgety/restless 1 0   Suicidal thoughts 0 0   PHQ-9 Score 5 5   Difficult doing work/chores Not difficult at all Somewhat difficult     Interpretation of Total Score  Total Score Depression Severity:  1-4 = Minimal depression, 5-9 = Mild depression, 10-14 = Moderate depression, 15-19 = Moderately severe depression, 20-27 = Severe depression   Psychosocial Evaluation and Intervention:  Psychosocial Evaluation - 07/26/20 0904      Psychosocial Evaluation & Interventions   Interventions Encouraged to exercise with the program and follow exercise prescription;Stress management education;Relaxation  education    Comments Patient has had a stroke last year and has not done much since then. She can look to her husband, son and daughter for support. She has a somewhat positive outlook on her health.    Expected Outcomes Short: Exercise regularly to support mental health and notify staff of any changes. Long: maintain mental health and well being through teaching of rehab or prescribed medications independently.    Continue Psychosocial Services  Follow up required by staff           Psychosocial Re-Evaluation:   Psychosocial Discharge (Final Psychosocial Re-Evaluation):   Vocational Rehabilitation: Provide vocational rehab assistance to qualifying candidates.   Vocational Rehab Evaluation & Intervention:   Education: Education Goals: Education classes will be provided on a variety of topics geared toward better understanding of heart health and risk factor modification. Participant will state understanding/return demonstration of topics presented as noted by education Clark scores.  Learning Barriers/Preferences:  Learning Barriers/Preferences - 07/26/20 0901      Learning Barriers/Preferences   Learning Barriers None    Learning Preferences None           General Cardiac Education Topics:  AED/CPR: - Group verbal and written instruction with the use of models to demonstrate the basic use of the AED with the basic ABC's of resuscitation.   Anatomy and Cardiac Procedures: - Group verbal and visual presentation and models provide information about basic cardiac anatomy and function. Reviews the testing methods done to diagnose heart disease and the outcomes of the Clark results. Describes the treatment choices: Medical Management, Angioplasty, or Coronary Bypass Surgery  for treating various heart conditions including Myocardial Infarction, Angina, Valve Disease, and Cardiac Arrhythmias.  Written material given at graduation. Flowsheet Row Cardiac Rehab from 08/08/2020 in [facility]  Cardiac and Pulmonary Rehab  Education need identified 08/06/20      Medication Safety: - Group verbal and visual instruction to review commonly prescribed medications for heart and lung disease. Reviews the medication, class of the drug, and side effects. Includes the steps to properly store meds and maintain the prescription regimen.  Written material given at graduation.   Intimacy: - Group verbal instruction through game format to discuss how heart and lung disease can affect sexual intimacy. Written material given at graduation.. Flowsheet Row Cardiac Rehab from 08/08/2020 in [facility] Cardiac and Pulmonary Rehab  Date 08/08/20  Educator [facility]  Instruction Review Code 1- Verbalizes Understanding      Know Your Numbers and Heart Failure: - Group verbal and visual instruction to discuss disease risk factors for cardiac and pulmonary disease and treatment options.  Reviews associated critical values for Overweight/Obesity, Hypertension, Cholesterol, and Diabetes.  Discusses basics of heart failure: signs/symptoms and treatments.  Introduces Heart Failure Zone chart for action plan for heart failure.  Written material given at graduation.   Infection Prevention: - Provides verbal and written material to individual with discussion of infection control including proper hand washing and proper equipment cleaning during exercise session. Flowsheet Row Cardiac Rehab from 08/08/2020 in [facility] Cardiac and Pulmonary Rehab  Date 07/26/20  Educator [facility]  Instruction Review Code 1- Verbalizes Understanding      Falls Prevention: - Provides verbal and written material to individual with discussion of falls prevention and safety. Flowsheet Row Cardiac Rehab from 08/08/2020 in [facility] Cardiac and Pulmonary Rehab  Date 07/26/20  Educator [facility]  Instruction Review Code 1- Verbalizes Understanding      Other: -Provides group and verbal instruction on various topics (see comments)   Knowledge Questionnaire  Score:  Knowledge Questionnaire Score - 08/06/20 1422      Knowledge Questionnaire Score   Pre Score 22/26 Education Focus: Depression, Angina, and Nutrition           Core Components/Risk Factors/Patient Goals at Admission:  Personal Goals and Risk Factors at Admission - 08/06/20 1423      Core Components/Risk Factors/Patient Goals on Admission    Weight Management Yes;Weight Loss;Obesity    Intervention Weight Management: Develop a combined nutrition and exercise program designed to reach desired caloric intake, while maintaining appropriate intake of nutrient and fiber, sodium and fats, and appropriate energy expenditure required for the weight goal.;Weight Management: Provide education and appropriate resources to help participant work on and attain dietary goals.;Weight Management/Obesity: Establish reasonable short term and long term weight goals.;Obesity: Provide education and appropriate resources to help participant work on and attain dietary goals.    Admit Weight 240 lb (108.9 kg)    Goal Weight: Short Term 235 lb (106.6 kg)    Goal Weight: Long Term 230 lb (104.3 kg)    Expected Outcomes Short Term: Continue to assess and modify interventions until short term weight is achieved;Long Term: Adherence to nutrition and physical activity/exercise program aimed toward attainment of established weight goal;Weight Loss: Understanding of general recommendations for a balanced deficit meal plan, which promotes 1-2 lb weight loss per week and includes a negative energy balance of [phone] kcal/d;Understanding recommendations for meals to include 15-35% energy as protein, 25-35% energy from fat, 35-60% energy from carbohydrates, less than 200mg  of dietary cholesterol, 20-35 gm of total fiber  daily;Understanding of distribution of calorie intake throughout the day with the consumption of 4-5 meals/snacks    Heart Failure Yes    Intervention Provide a combined exercise and nutrition program that  is supplemented with education, support and counseling about heart failure. Directed toward relieving symptoms such as shortness of breath, decreased exercise tolerance, and extremity edema.    Expected Outcomes Improve functional capacity of life;Short term: Attendance in program 2-3 days a week with increased exercise capacity. Reported lower sodium intake. Reported increased fruit and vegetable intake. Reports medication compliance.;Short term: Daily weights obtained and reported for increase. Utilizing diuretic protocols set by physician.;Long term: Adoption of self-care skills and reduction of barriers for early signs and symptoms recognition and intervention leading to self-care maintenance.    Hypertension Yes    Intervention Monitor prescription use compliance.;Provide education on lifestyle modifcations including regular physical activity/exercise, weight management, moderate sodium restriction and increased consumption of fresh fruit, vegetables, and low fat dairy, alcohol moderation, and smoking cessation.    Expected Outcomes Short Term: Continued assessment and intervention until BP is < 140/23mm HG in hypertensive participants. < 130/16mm HG in hypertensive participants with diabetes, heart failure or chronic kidney disease.;Long Term: Maintenance of blood pressure at goal levels.    Lipids Yes    Intervention Provide education and support for participant on nutrition & aerobic/resistive exercise along with prescribed medications to achieve LDL 70mg , HDL >40mg .    Expected Outcomes Short Term: Participant states understanding of desired cholesterol values and is compliant with medications prescribed. Participant is following exercise prescription and nutrition guidelines.;Long Term: Cholesterol controlled with medications as prescribed, with individualized exercise RX and with personalized nutrition plan. Value goals: LDL < 70mg , HDL > 40 mg.           Education:Diabetes - Individual  verbal and written instruction to review signs/symptoms of diabetes, desired ranges of glucose level fasting, after meals and with exercise. Acknowledge that pre and post exercise glucose checks will be done for 3 sessions at entry of program.   Core Components/Risk Factors/Patient Goals Review:    Core Components/Risk Factors/Patient Goals at Discharge (Final Review):    ITP Comments:  ITP Comments    Row Name 07/26/20 0906 08/06/20 1408 08/08/20 0728 08/14/20 0838 08/15/20 0600   ITP Comments Virtual Visit completed. Patient informed on EP and RD appointment and 6 Minute walk Clark. Patient also informed of patient health questionnaires on My Chart. Patient Verbalizes understanding. Visit diagnosis can be found in [facility] 07/23/2020. Completed 6MWT and gym orientation. Initial ITP created and sent for review to Dr. Emily Filbert, Medical Director. First full day of exercise!  Patient was oriented to gym and equipment including functions, settings, policies, and procedures.  Patient's individual exercise prescription and treatment plan were reviewed.  All starting workloads were established based on the results of the 6 minute walk Clark done at initial orientation visit.  The plan for exercise progression was also introduced and progression will be customized based on patient's performance and goals. 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  Call into doctor and change in medication therapy. 30 Day review completed. Medical Director ITP review done, changes made as directed, and signed approval by Medical Director.          Comments:

## 2020-08-17 ENCOUNTER — Telehealth: Payer: Self-pay

## 2020-08-17 ENCOUNTER — Telehealth: Payer: Self-pay | Admitting: Internal Medicine

## 2020-08-17 NOTE — Telephone Encounter (Signed)
Quinidine is an intriguing thought

## 2020-08-17 NOTE — Telephone Encounter (Signed)
[name] from [facility] states the patient told them her defibrillator went off Sunday. She states the patient thought she had to wait a couple of months for surgery. She is currently scheduled for surgery 08/24/2020. Please advise.

## 2020-08-17 NOTE — Telephone Encounter (Signed)
ICM call to patient and requested she send manual remote transmission from home monitor on 08/20/2020 to recheck fluid levels.  Advised 08/12/2020 report suggested small amount of fluid accumulation.  She has been wearing compression stocking and does not feel like she has any fluid at this time. She remains a little anxious regarding the device shock that occurred 08/12/2020 but is feeling fine at this time.  She will send report 2/28 for review.

## 2020-08-17 NOTE — Telephone Encounter (Signed)
Pt called to let us know her defibrillator went off Sunday 08/11/20 and she is scheduled for surgery on 08/24/20. Pt is not sure if she can have surgery following this incident. I have advised her to call her EP providers office. I have also left them a message for Dr. Olin Pia nurse to call our triage line back to advise.

## 2020-08-20 ENCOUNTER — Encounter: Payer: BC Managed Care – PPO | Admitting: *Deleted

## 2020-08-20 ENCOUNTER — Ambulatory Visit (INDEPENDENT_AMBULATORY_CARE_PROVIDER_SITE_OTHER): Payer: BC Managed Care – PPO

## 2020-08-20 ENCOUNTER — Other Ambulatory Visit: Payer: Self-pay

## 2020-08-20 DIAGNOSIS — Z95810 Presence of automatic (implantable) cardiac defibrillator: Secondary | ICD-10-CM

## 2020-08-20 DIAGNOSIS — Z4502 Encounter for adjustment and management of automatic implantable cardiac defibrillator: Secondary | ICD-10-CM | POA: Diagnosis not present

## 2020-08-20 DIAGNOSIS — I5022 Chronic systolic (congestive) heart failure: Secondary | ICD-10-CM

## 2020-08-20 NOTE — Progress Notes (Signed)
Daily Session Note  Patient Details  Name: [name] MRN: [id number] Date of Birth: [birth year] Referring Provider:   Flowsheet Row Cardiac Rehab from 08/06/2020 in [facility] Cardiac and Pulmonary Rehab  Referring Provider [name] MD      Encounter Date: 08/20/2020  Check In:  Session Check In - 08/20/20 0807      Check-In   Supervising physician immediately available to respond to emergencies See telemetry face sheet for immediately available ER MD    Location [facility]-Cardiac & Pulmonary Rehab    Staff Present Heath Lark, RN, BSN, CCRP;Joseph Hood RCP,RRT,BSRT;Kelly Norborne, [place], ACSM CEP, Exercise Physiologist    Virtual Visit No    Medication changes reported     Yes  Mexiletine and Amiodarone added   Fall or balance concerns reported    No    Warm-up and Cool-down Performed on first and last piece of equipment    Resistance Training Performed Yes    VAD Patient? No    PAD/SET Patient? No      Pain Assessment   Currently in Pain? No/denies              Social History   Tobacco Use  Smoking Status Never Smoker  Smokeless Tobacco Never Used    Goals Met:  Independence with exercise equipment Exercise tolerated well No report of cardiac concerns or symptoms  Goals Unmet:  Not Applicable  Comments: Pt able to follow exercise prescription today without complaint.  Will continue to monitor for progression.    Dr. Emily Filbert is Medical Director for [place] [facility].

## 2020-08-20 NOTE — Progress Notes (Signed)
EPIC Encounter for ICM Monitoring  Patient Name: [name] is a 50 y.o. female Date: 08/20/2020 Primary Care Physican: [name], MD Primary Cardiologist:McLean Goshen 08/20/2020 Weight: 237.5lbs  Spoke with patient and reports feeling well at this time.  Denies fluid symptoms.    OptivolThoracicimpedancenormal.  Prescribed:  Torsemide20 mgTake 4tablets(80 mg total)by mouth every morning.   Potassium 20 mEqTake3tablets (60 mEq total) by mouth daily  Spironolactone 25 mg take 1 tabletat bedtime.   Metolazone 2.5 mgTake 2.5 mg by mouth as needed (extra fluid as directed by HF clinic).  Labs: 08/07/2020 Creatinine 1.15, BUN 16, Potassium 3.9, Sodium 138, GFR 58 07/23/2020 Creatinine 1.67, BUN 18, Potassium 3.0, Sodium 134, GFR 37 05/21/2020 Creatinine 1.20, BUN 11, Potassium 4.1, Sodium 137, GFR 55 05/07/2020 Creatinine 1.28, BUN 9,   Potassium 3.2, Sodium 137, GFR 51  04/25/2020 Creatinine 1.30, BUN 16, Potassium 4.5, Sodium 137, GFR 50  04/20/2020 Creatinine 1.21, BUN 9,   Potassium 2.8, Sodium 143, GFR 55  04/18/2020 Creatinine 0.89, BUN 9,   Potassium 3.5, Sodium 142  A complete set of results can be found in Results Review.  Recommendations:No changes and encouraged to call if experiencing any fluid symptoms.  Follow-up plan: ICM clinic phone appointment on4/09/2020. 91 day device clinic remote transmission3/28/2022.   EP/Cardiology Office Visits:10/23/2020 with Dr [name].   Copy of ICM check sent to [place]   3 month ICM trend: 08/20/2020.    1 Year ICM trend:       [name], RN 08/20/2020 1:38 PM

## 2020-08-20 NOTE — Progress Notes (Signed)
Your procedure is scheduled on Friday August 24, 2020.  Report to [facility] Main Entrance "A" at 05:30 A.M., and check in at the Admitting office.  Call this number if you have problems the morning of surgery: [phone]  Call [phone] if you have any questions prior to your surgery date Monday-Friday 8am-4pm   Remember: Do not eat or drink after midnight the night before your surgery  Take these medicines the morning of surgery with A SIP OF WATER: amiodarone (PACERONE) atorvastatin (LIPITOR) carvedilol (COREG)  digoxin (LANOXIN)  levothyroxine (SYNTHROID)  mexiletine (MEXITIL)   Follow your surgeon's instructions on when to stop Aspirin and ticagrelor (BRILINTA) .  If no instructions were given by your surgeon then you will need to call the office to get those instructions.    As of today, STOP taking any Aspirin (unless otherwise instructed by your surgeon), Aleve, Naproxen, Ibuprofen, Motrin, Advil, Goody's, BC's, all herbal medications, fish oil, and all vitamins.    WHAT DO I DO ABOUT MY DIABETES MEDICATION?  FARXIGA  Hold day before surgery NONE day of surgery  HOW TO MANAGE YOUR DIABETES BEFORE AND AFTER SURGERY  Why is it important to control my blood sugar before and after surgery? . Improving blood sugar levels before and after surgery helps healing and can limit problems. . A way of improving blood sugar control is eating a healthy diet by: o  Eating less sugar and carbohydrates o  Increasing activity/exercise o  Talking with your doctor about reaching your blood sugar goals . High blood sugars (greater than 180 mg/dL) can raise your risk of infections and slow your recovery, so you will need to focus on controlling your diabetes during the weeks before surgery. . Make sure that the doctor who takes care of your diabetes knows about your planned surgery including the date and location.  How do I manage my blood sugar before surgery? . Check your blood  sugar at least 4 times a day, starting 2 days before surgery, to make sure that the level is not too high or low. . Check your blood sugar the morning of your surgery when you wake up and every 2 hours until you get to the Short Stay unit. o If your blood sugar is less than 70 mg/dL, you will need to treat for low blood sugar: - Do not take insulin. - Treat a low blood sugar (less than 70 mg/dL) with  cup of clear juice (cranberry or apple), 4 glucose tablets, OR glucose gel. - Recheck blood sugar in 15 minutes after treatment (to make sure it is greater than 70 mg/dL). If your blood sugar is not greater than 70 mg/dL on recheck, call [phone] for further instructions. . Report your blood sugar to the short stay nurse when you get to Short Stay.  . If you are admitted to the hospital after surgery: o Your blood sugar will be checked by the staff and you will probably be given insulin after surgery (instead of oral diabetes medicines) to make sure you have good blood sugar levels. o The goal for blood sugar control after surgery is 80-180 mg/dL.    The Morning of Surgery  Do not wear jewelry, make-up or nail polish.  Do not wear lotions, powders, or perfumes, or deodorant  Do not shave 48 hours prior to surgery.    Do not bring valuables to the hospital.  [facility] is not responsible for any belongings or valuables.  If you are  a smoker, DO NOT Smoke 24 hours prior to surgery  If you wear a CPAP at night please bring your mask the morning of surgery   Remember that you must have someone to transport you home after your surgery, and remain with you for 24 hours if you are discharged the same day.   Please bring cases for contacts, glasses, hearing aids, dentures or bridgework because it cannot be worn into surgery.    Leave your suitcase in the car.  After surgery it may be brought to your room.  For patients admitted to the hospital, discharge time will be determined by your  treatment team.  Patients discharged the day of surgery will not be allowed to drive home.    Special instructions:   [place]- Preparing For Surgery  Before surgery, you can play an important role. Because skin is not sterile, your skin needs to be as free of germs as possible. You can reduce the number of germs on your skin by washing with CHG (chlorahexidine gluconate) Soap before surgery.  CHG is an antiseptic cleaner which kills germs and bonds with the skin to continue killing germs even after washing.    Oral Hygiene is also important to reduce your risk of infection.  Remember - BRUSH YOUR TEETH THE MORNING OF SURGERY WITH YOUR REGULAR TOOTHPASTE  Please do not use if you have an allergy to CHG or antibacterial soaps. If your skin becomes reddened/irritated stop using the CHG.  Do not shave (including legs and underarms) for at least 48 hours prior to first CHG shower. It is OK to shave your face.  Please follow these instructions carefully.   1. Shower the NIGHT BEFORE SURGERY and the MORNING OF SURGERY with CHG Soap.   2. If you chose to wash your hair and body, wash as usual with your normal shampoo and body-wash/soap.  3. Rinse your hair and body thoroughly to remove the shampoo and soap.  4. Apply CHG directly to the skin (ONLY FROM THE NECK DOWN) and wash gently with a scrungie or a clean washcloth.   5. Do not use on open wounds or open sores. Avoid contact with your eyes, ears, mouth and genitals (private parts). Wash Face and genitals (private parts)  with your normal soap.   6. Wash thoroughly, paying special attention to the area where your surgery will be performed.  7. Thoroughly rinse your body with warm water from the neck down.  8. DO NOT shower/wash with your normal soap after using and rinsing off the CHG Soap.  9. Pat yourself dry with a CLEAN TOWEL.  10. Wear CLEAN PAJAMAS to bed the night before surgery  11. Place CLEAN SHEETS on your bed the night  of your first shower and DO NOT SLEEP WITH PETS.  12. Wear comfortable clothes the morning of surgery.     Day of Surgery:  Please shower the morning of surgery with the CHG soap Do not apply any deodorants/lotions. Please wear clean clothes to the hospital/surgery center.   Remember to brush your teeth WITH YOUR REGULAR TOOTHPASTE.   Please read over the following fact sheets that you were given.

## 2020-08-21 ENCOUNTER — Encounter: Payer: Self-pay | Admitting: Internal Medicine

## 2020-08-21 ENCOUNTER — Encounter (HOSPITAL_COMMUNITY): Payer: Self-pay

## 2020-08-21 ENCOUNTER — Other Ambulatory Visit: Payer: Self-pay

## 2020-08-21 ENCOUNTER — Encounter (HOSPITAL_COMMUNITY)
Admission: RE | Admit: 2020-08-21 | Discharge: 2020-08-21 | Disposition: A | Discharge status: Home / Self Care | Payer: BC Managed Care – PPO | Source: Ambulatory Visit | Attending: Surgery | Admitting: Surgery

## 2020-08-21 DIAGNOSIS — I5022 Chronic systolic (congestive) heart failure: Secondary | ICD-10-CM | POA: Insufficient documentation

## 2020-08-21 DIAGNOSIS — Z7982 Long term (current) use of aspirin: Secondary | ICD-10-CM | POA: Insufficient documentation

## 2020-08-21 DIAGNOSIS — E039 Hypothyroidism, unspecified: Secondary | ICD-10-CM | POA: Diagnosis not present

## 2020-08-21 DIAGNOSIS — I493 Ventricular premature depolarization: Secondary | ICD-10-CM | POA: Insufficient documentation

## 2020-08-21 DIAGNOSIS — I110 Hypertensive heart disease with heart failure: Secondary | ICD-10-CM | POA: Diagnosis not present

## 2020-08-21 DIAGNOSIS — Z01812 Encounter for preprocedural laboratory examination: Secondary | ICD-10-CM | POA: Diagnosis not present

## 2020-08-21 DIAGNOSIS — E669 Obesity, unspecified: Secondary | ICD-10-CM | POA: Insufficient documentation

## 2020-08-21 DIAGNOSIS — Z6839 Body mass index (BMI) 39.0-39.9, adult: Secondary | ICD-10-CM | POA: Diagnosis not present

## 2020-08-21 DIAGNOSIS — Z7901 Long term (current) use of anticoagulants: Secondary | ICD-10-CM | POA: Insufficient documentation

## 2020-08-21 DIAGNOSIS — I252 Old myocardial infarction: Secondary | ICD-10-CM | POA: Insufficient documentation

## 2020-08-21 DIAGNOSIS — G4733 Obstructive sleep apnea (adult) (pediatric): Secondary | ICD-10-CM | POA: Diagnosis not present

## 2020-08-21 DIAGNOSIS — I428 Other cardiomyopathies: Secondary | ICD-10-CM | POA: Diagnosis not present

## 2020-08-21 DIAGNOSIS — Z79899 Other long term (current) drug therapy: Secondary | ICD-10-CM | POA: Diagnosis not present

## 2020-08-21 HISTORY — DX: Sleep apnea, unspecified: G47.30

## 2020-08-21 LAB — CBC
HCT: 34.1 % — ABNORMAL LOW (ref 36.0–46.0)
Hemoglobin: 10.8 g/dL — ABNORMAL LOW (ref 12.0–15.0)
MCH: 24.9 pg — ABNORMAL LOW (ref 26.0–34.0)
MCHC: 31.7 g/dL (ref 30.0–36.0)
MCV: 78.6 fL — ABNORMAL LOW (ref 80.0–100.0)
Platelets: 347 10*3/uL (ref 150–400)
RBC: 4.34 MIL/uL (ref 3.87–5.11)
RDW: 16.3 % — ABNORMAL HIGH (ref 11.5–15.5)
WBC: 9.7 10*3/uL (ref 4.0–10.5)
nRBC: 0 % (ref 0.0–0.2)

## 2020-08-21 LAB — COMPREHENSIVE METABOLIC PANEL
ALT: 14 U/L (ref 0–44)
AST: 17 U/L (ref 15–41)
Albumin: 3.4 g/dL — ABNORMAL LOW (ref 3.5–5.0)
Alkaline Phosphatase: 81 U/L (ref 38–126)
Anion gap: 11 (ref 5–15)
BUN: 17 mg/dL (ref 6–20)
CO2: 21 mmol/L — ABNORMAL LOW (ref 22–32)
Calcium: 9.4 mg/dL (ref 8.9–10.3)
Chloride: 104 mmol/L (ref 98–111)
Creatinine, Ser: 1.36 mg/dL — ABNORMAL HIGH (ref 0.44–1.00)
GFR, Estimated: 48 mL/min — ABNORMAL LOW (ref >60)
Glucose, Bld: 79 mg/dL (ref 70–99)
Potassium: 4.1 mmol/L (ref 3.5–5.1)
Sodium: 136 mmol/L (ref 135–145)
Total Bilirubin: 0.5 mg/dL (ref 0.3–1.2)
Total Protein: 7.7 g/dL (ref 6.5–8.1)

## 2020-08-21 LAB — PROTIME-INR
INR: 1.1 (ref 0.8–1.2)
Prothrombin Time: 13.7 seconds (ref 11.4–15.2)

## 2020-08-21 LAB — URINALYSIS, ROUTINE W REFLEX MICROSCOPIC
Bilirubin Urine: NEGATIVE
Glucose, UA: NEGATIVE mg/dL
Ketones, ur: NEGATIVE mg/dL
Leukocytes,Ua: NEGATIVE
Nitrite: NEGATIVE
Protein, ur: NEGATIVE mg/dL
Specific Gravity, Urine: 1.014 (ref 1.005–1.030)
pH: 5 (ref 5.0–8.0)

## 2020-08-21 LAB — BLOOD GAS, ARTERIAL
Acid-base deficit: 2 mmol/L (ref 0.0–2.0)
Bicarbonate: 21.8 mmol/L (ref 20.0–28.0)
FIO2: 21
O2 Saturation: 98.1 %
Patient temperature: 37
pCO2 arterial: 34.8 mmHg (ref 32.0–48.0)
pH, Arterial: 7.414 (ref 7.350–7.450)
pO2, Arterial: 114 mmHg — ABNORMAL HIGH (ref 83.0–108.0)

## 2020-08-21 LAB — APTT: aPTT: 30 seconds (ref 24–36)

## 2020-08-21 LAB — SURGICAL PCR SCREEN
MRSA, PCR: NEGATIVE
Staphylococcus aureus: NEGATIVE

## 2020-08-21 NOTE — Progress Notes (Unsigned)
PERIOPERATIVE PRESCRIPTION FOR IMPLANTED CARDIAC DEVICE PROGRAMMING  Patient Information: Name:  [name]  DOB:  1970-07-16  MRN:  [id number]  {TIP - You do not have to delete this tip  -  Copy the info from the staff message sent by the PAT staff  then press F2 here and paste the information using CTL - V on the next line :[id number]}  Kayleen Memos, RN  P Cv Div Heartcare Device Cc: Clayborn Bigness, RN Planned Procedure: Insertion of barostim  Surgeon: Dr. Harold Barban  Date of Procedure: 08/24/2020  Cautery will be used.  Position during surgery:    Please send documentation back to:  [facility] (Fax # [phone])   Kayleen Memos, RN  08/20/2020 6:14 PM   Device Information:  Clinic EP Physician:  Virl Axe, MD   Device Type:  Defibrillator Manufacturer and Phone #:  Medtronic: [phone] Pacemaker Dependent?:  No. Date of Last Device Check:  06/18/20 Normal Device Function?:  Yes.    Electrophysiologist's Recommendations:   Have magnet available.  Provide continuous ECG monitoring when magnet is used or reprogramming is to be performed.   Procedure will likely interfere with device function.  Device should be programmed:  Tachy therapies disabled  Per Device Clinic Standing Orders, York Ram, RN  8:58 AM 08/21/2020

## 2020-08-21 NOTE — Progress Notes (Addendum)
PCP:  Denies Cardiologist:  Lehman Prom, MD ICD: Virl Axe, MD  EKG: 05/16/20 CXR: na ECHO: 07/23/20 Stress Test: 01/31/20 Cardiac Cath: 05/23/16  Fasting Blood Sugar- na Checks Blood Sugar_na__ times a day  OSA/CPAP:  Yes, wears nightly  ASA: Last dose 08/18/20 Blood Thinners:  Last dose 08/18/20  Covid test 08/23/20  Anesthesia Review:  Yes, ICD and cardiac history.  Note under encounters states and confirmed by patient that ICD went off 08/19/20.  Email and [employer] Med[employer] by [employer], Therapist, sports. Message sent to Dr. Trula Slade regarding many bacteria in U/A.  Patient denies shortness of breath, fever, cough, and chest pain at PAT appointment.  Patient verbalized understanding of instructions provided today at the PAT appointment.  Patient asked to review instructions at home and day of surgery.

## 2020-08-21 NOTE — Progress Notes (Signed)
[employer] sent to Dr. Trula Slade regarding abnormal lab on U/A.

## 2020-08-22 ENCOUNTER — Encounter: Payer: BC Managed Care – PPO | Attending: Cardiology

## 2020-08-22 ENCOUNTER — Encounter (HOSPITAL_COMMUNITY): Payer: Self-pay | Admitting: Vascular Surgery

## 2020-08-22 DIAGNOSIS — I5022 Chronic systolic (congestive) heart failure: Secondary | ICD-10-CM | POA: Insufficient documentation

## 2020-08-22 NOTE — Progress Notes (Signed)
Anesthesia Chart Review:  Case: [id number] Date/Time: 08/24/20 0715   Procedure: INSERTION OF Acquanetta Belling (N/A )   Anesthesia type: General   Pre-op diagnosis: Chronic Systolic Heart Failure   Location: [facility] OR ROOM 16 / [place] [facility]   Surgeons: Serafina Mitchell, MD       DISCUSSION: Patient is a 50 year old female scheduled for the above procedure.  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  Has Medtronic ICD. Optivol thoracic impedance normal as of 08/20/20. Reported ICD discharge in the early morning hours of 08/12/20. She notified cardiology and nursing staff at [facility] because she was unsure if this would affect timing of her Barostim insertion. Per notation by Dr. [name], she had "PMVt and dimorphic PVC burden>10%" with 08/12/20 shock occurring for "rapid VT that stared without pausing and then regularized." He increased her amiodarone to 400 mg BID x 2 weeks then 400 mg daily x 2 weeks then 200 daily and started her on mexiletine150 mg BID. He also reached out to his colleagues Dr. Lovena Le and Dr. Quentin Ore for their input regarding possibility of ablation. Dr. Lovena Le thought it would be hard to induce, map, and ablate, but could consider trial of quinidine. Dr. Quentin Ore also thought it would be "very difficult to map/ablate given the relatively infrequent/unpredictable nature of the PVCs."  Per [facility], hold Brilinta 5 days prior to procedure (last dose 08/18/20). As above, patient had inquired if recent ICD  shock would affect timing of Barostim. I reached out to [facility] to see if they had heard back from EP. In the interim, appears case is no longer scheduled for 08/24/20.    VS: BP 92/65   Pulse 76   Temp 36.7 C (Oral)   Resp 18   Ht 5' 5.5" (1.664 m)   Wt 108.3 kg   LMP 08/01/2020   SpO2 100%   BMI 39.12 kg/m    PROVIDERS: [name], MD is PCP  Virl Axe, MD is EP [name], MD is HF cardiologist Fransico Him, MD is OSA cardiologist   LABS: Labs reviewed: Acceptable for surgery. PAT RN routed UA results to surgeon. (all labs ordered are listed, but only abnormal results are displayed)  Labs Reviewed  BLOOD GAS, ARTERIAL - Abnormal; Notable for the following components:      Result Value   pO2, Arterial 114 (*)    Allens test (pass/fail) BRACHIAL ARTERY (*)    All other components within normal limits  CBC - Abnormal; Notable for the following components:   Hemoglobin 10.8 (*)    HCT 34.1 (*)    MCV 78.6 (*)    MCH 24.9 (*)    RDW 16.3 (*)    All other components within normal limits  COMPREHENSIVE METABOLIC PANEL - Abnormal; Notable for the following components:   CO2 21 (*)    Creatinine, Ser 1.36 (*)    Albumin 3.4 (*)    GFR, Estimated 48 (*)    All other components within normal limits  URINALYSIS, ROUTINE W REFLEX MICROSCOPIC - Abnormal; Notable for the following components:   APPearance CLOUDY (*)  Hgb urine dipstick SMALL (*)    Bacteria, UA MANY (*)    All other components within normal limits  SURGICAL PCR SCREEN  APTT  PROTIME-INR    IMAGES: 1V CXR 04/19/20: IMPRESSION: Stable cardiomegaly. No acute cardiopulmonary abnormality seen. Endotracheal and nasogastric tubes have been removed.  CTA head/neck 04/19/20: IMPRESSION: 1. Left MCA stenting with nonocclusive thrombus seen along the posterior wall of the mid stent. 2. No stenosis or embolic source seen in the neck.   EKG: 05/16/20: SR. LVH with QRS widening.  Nonspecific ST/T  wave abnormality.   CV: Carotid US 07/23/20: Summary:  - Right Carotid: The extracranial vessels were near-normal with only minimal wall thickening or plaque.  - Left Carotid: The extracranial vessels were near-normal with only minimal wall thickening or plaque.  - Vertebrals:  Bilateral vertebral arteries demonstrate antegrade flow.  - Subclavians: Normal flow hemodynamics were seen in bilateral subclavian arteries.    Echo 07/23/20: IMPRESSIONS   1. Left ventricular ejection fraction, by estimation, is 20 to 25%. The  left ventricle has severely decreased function. The left ventricle  demonstrates global hypokinesis. The left ventricular internal cavity size  was moderately dilated. Left  ventricular diastolic parameters are indeterminate.   2. Right ventricular systolic function is mildly reduced. The right  ventricular size is normal. There is normal pulmonary artery systolic  pressure. The estimated right ventricular systolic pressure is 75.9 mmHg.   3. Left atrial size was moderately dilated.   4. The mitral valve is normal in structure. Mild mitral valve  regurgitation. No evidence of mitral stenosis.   5. The aortic valve is grossly normal. Aortic valve regurgitation is  trivial. No aortic stenosis is present.   6. The inferior vena cava is normal in size with greater than 50%  respiratory variability, suggesting right atrial pressure of 3 mmHg.  - Comparison(s): A prior study was performed on 04/17/20. No significant  change from prior study. Prior images reviewed side by side.    CPX 01/31/20: Conclusion: Exercise testing with gas exchange demonstrates moderate functional impairment when compared to matched sedentary norms. There is a mild HF limitation with further limitations due to body habitus. Overall CPX slightly worsened from 02/2019 with reduced PVO2 and elevated VE/VCO2 slope. There was chronotropic incompetence. Landis Martins, MS, ACSM-RCEP)  - Attending: Mild  functio al limitation primarily due to obesithy and deconditionining. There is also significant chronotropic incompetence and a blunted BP response to exercise. Consideration can be given to a trial of reducing the patient's b-blocker dose and retesting as needed. Glori Bickers, MD)    Long term event monitor 06/28/19-07/01/19: Study Highlights 1. 6.2% PVCs 2. 1 short run VT.  (Previous ZioPatch in 07/2018 showed 10.% PVCs and multiple runs of NSVT. She was started on amiodarone, but repeat ZioPatch 08/2018 showed 13.3% PVCs with 40+ runs of PSVT. She was referred to EP Dr. [name]. Increased PVCs thought to be electromechanical in nature due to worsening heart failure.)    RHC 05/23/16: RHC Procedural Findings: Hemodynamics (mmHg) RA mean 5 RV 55/8 PA 56/31, mean 40 PCWP mean 24  Oxygen saturations: PA 61% AO 99%  Cardiac Output (Fick) 4.3  Cardiac Index (Fick) 2.0 PVR 3.7 WU  Conclusion: 1. Elevated left heart filling pressure with pulmonary venous hypertension.  2. Low, but not markedly low, cardiac index.    LHC 09/2013: By 09/24/13 [facility] Discharge Summary: "Chest pain. Ruled in for non-ST segment elevation MI, with elevated troponins. Was seen by Cardiology. No  history of nonischemic cardiomyopathy. Because of ongoing chest pain, she had a cardiac catheterization done, which showed normal coronaries, so not sure if this was a coronary vasospasm. However, her pain has improved. She is on the appropriate cardiac medications with aspirin, statin, Coreg, and lisinopril."   Past Medical History:  Diagnosis Date   Automatic implantable cardioverter-defibrillator in situ    Chronic CHF (congestive heart failure) ([place])    a. EF 15-20% b. RHC (09/2013) RA 14, RV 57/22, PA 64/36 (48), PCWP 18, FIck CO/CI 3.7/1.6, PVR 8.1 WU, PA sat 47%    History of stomach ulcers    Hypertension    Hypotension    Hypothyroidism    Morbid obesity ([place])    Myocardial infarction ([place]) 08/2013   Nocturnal  dyspnea    Nonischemic cardiomyopathy ([facility])    Sinus tachycardia    Sleep apnea    Snoring-prob OSA 09/04/2011   Sprint Fidelis ICD lead RECALL  6949    UARS (upper airway resistance syndrome) 09/04/2011   HST 12/2013:  AHI 4/hr (numerous episodes of airflow reduction that did not have concomitant desaturation)     Past Surgical History:  Procedure Laterality Date   BREATH TEK H PYLORI N/A 11/09/2014   Procedure: BREATH TEK H PYLORI;  Surgeon: Greer Pickerel, MD;  Location: [facility];  Service: General;  Laterality: N/A;   CARDIAC CATHETERIZATION  ~ 2006; 09/2013   CARDIAC CATHETERIZATION N/A 05/23/2016   Procedure: Right Heart Cath;  Surgeon: Larey Dresser, MD;  Location: [place] CV LAB;  Service: Cardiovascular;  Laterality: N/A;   CARDIAC DEFIBRILLATOR PLACEMENT  2006; 12/26/2013   Medtronic [id number]; 12-2013 ICD gen change and RV lead revision with new 6935 RV lead by Dr [name]   CESAREAN SECTION  1999   IMPLANTABLE CARDIOVERTER DEFIBRILLATOR GENERATOR CHANGE N/A 12/26/2013   Procedure: IMPLANTABLE CARDIOVERTER DEFIBRILLATOR GENERATOR CHANGE;  Surgeon: Deboraha Sprang, MD;  Location: [facility] CATH LAB;  Service: Cardiovascular;  Laterality: N/A;   IR CT HEAD LTD  04/17/2020   IR CT HEAD LTD  04/17/2020   IR INTRA CRAN STENT  04/17/2020   IR PERCUTANEOUS ART THROMBECTOMY/INFUSION INTRACRANIAL INC DIAG ANGIO  04/17/2020   IR RADIOLOGIST EVAL & MGMT  06/05/2020   LEAD REVISION N/A 12/26/2013   Procedure: LEAD REVISION;  Surgeon: Deboraha Sprang, MD;  Location: [facility] CATH LAB;  Service: Cardiovascular;  Laterality: N/A;   RADIOLOGY WITH ANESTHESIA N/A 04/17/2020   Procedure: Code Stroke;  Surgeon: Luanne Bras, MD;  Location: [place];  Service: Radiology;  Laterality: N/A;   RIGHT HEART CATHETERIZATION N/A 02/15/2014   Procedure: RIGHT HEART CATH;  Surgeon: Larey Dresser, MD;  Location: [facility] CATH LAB;  Service: Cardiovascular;  Laterality: N/A;   TUBAL LIGATION  1999    MEDICATIONS:   amiodarone (PACERONE) 200 MG tablet   aspirin 81 MG chewable tablet   atorvastatin (LIPITOR) 40 MG tablet   carvedilol (COREG) 6.25 MG tablet   cholecalciferol (VITAMIN D3) 25 MCG (1000 UNIT) tablet   digoxin (LANOXIN) 0.125 MG tablet   FARXIGA 10 MG TABS tablet   levothyroxine (SYNTHROID) 25 MCG tablet   metolazone (ZAROXOLYN) 2.5 MG tablet   mexiletine (MEXITIL) 150 MG capsule   potassium chloride SA (KLOR-CON) 20 MEQ tablet   sacubitril-valsartan (ENTRESTO) 24-26 MG   spironolactone (ALDACTONE) 25 MG tablet   ticagrelor (BRILINTA) 90 MG TABS tablet   torsemide (DEMADEX) 20 MG tablet   Vitamin D, Ergocalciferol, (DRISDOL) 1.25 MG (50000 UNIT) CAPS  capsule   No current facility-administered medications for this encounter.    Myra Gianotti, PA-C Surgical Short Stay/Anesthesiology [facility] Phone [phone] [facility] Phone [phone] 08/22/2020 7:42 PM

## 2020-08-22 NOTE — Progress Notes (Signed)
Daily Session Note  Patient Details  Name: [name] MRN: [id number] Date of Birth: 07/26/1970 Referring Provider:   Flowsheet Row Cardiac Rehab from 08/06/2020 in [facility] Cardiac and Pulmonary Rehab  Referring Provider McLean, Dalton MD      Encounter Date: 08/22/2020  Check In:  Session Check In - 08/22/20 0723      Check-In   Supervising physician immediately available to respond to emergencies See telemetry face sheet for immediately available ER MD    Location [facility]-Cardiac & Pulmonary Rehab    Staff Present Kelly Bollinger, MPA, RN;Joseph Hood RCP,RRT,BSRT;Jessica [place], MA, RCEP, CCRP, CCET    Virtual Visit No    Medication changes reported     No    Fall or balance concerns reported    No    Warm-up and Cool-down Performed on first and last piece of equipment    Resistance Training Performed Yes    VAD Patient? No    PAD/SET Patient? No      Pain Assessment   Currently in Pain? No/denies              Social History   Tobacco Use  Smoking Status Never Smoker  Smokeless Tobacco Never Used    Goals Met:  Independence with exercise equipment Exercise tolerated well No report of cardiac concerns or symptoms Strength training completed today  Goals Unmet:  Not Applicable  Comments: Pt able to follow exercise prescription today without complaint.  Will continue to monitor for progression.  Reviewed home exercise with pt today.  Pt plans to walk and use equipment at home for exercise.  We also talked about using staff videos too. Reviewed THR, pulse, RPE, sign and symptoms, pulse oximetery and when to call 911 or MD.  Also discussed weather considerations and indoor options.  Pt voiced understanding.   Dr. Mark Miller is Medical Director for [facility] and [facility]. 

## 2020-08-23 ENCOUNTER — Other Ambulatory Visit: Payer: Self-pay

## 2020-08-23 ENCOUNTER — Other Ambulatory Visit
Admission: RE | Admit: 2020-08-23 | Discharge: 2020-08-23 | Disposition: A | Discharge status: Home / Self Care | Payer: BC Managed Care – PPO | Source: Ambulatory Visit | Attending: Surgery | Admitting: Surgery

## 2020-08-23 DIAGNOSIS — Z01812 Encounter for preprocedural laboratory examination: Secondary | ICD-10-CM | POA: Diagnosis not present

## 2020-08-23 DIAGNOSIS — Z20822 Contact with and (suspected) exposure to covid-19: Secondary | ICD-10-CM | POA: Insufficient documentation

## 2020-08-23 LAB — SARS CORONAVIRUS 2 (TAT 6-24 HRS): SARS Coronavirus 2: NEGATIVE

## 2020-08-24 ENCOUNTER — Ambulatory Visit: Admit: 2020-08-24 | Payer: BC Managed Care – PPO | Admitting: Surgery

## 2020-08-24 SURGERY — INSERTION, CAROTID SINUS BAROREFLEX ACTIVATION DEVICE
Anesthesia: General

## 2020-08-27 ENCOUNTER — Encounter: Payer: BC Managed Care – PPO | Admitting: *Deleted

## 2020-08-27 ENCOUNTER — Other Ambulatory Visit: Payer: Self-pay

## 2020-08-27 DIAGNOSIS — I5022 Chronic systolic (congestive) heart failure: Secondary | ICD-10-CM | POA: Diagnosis not present

## 2020-08-27 NOTE — Progress Notes (Signed)
Daily Session Note  Patient Details  Name: [name] MRN: [id number] Date of Birth: [birth year] Referring Provider:   Flowsheet Row Cardiac Rehab from 08/06/2020 in [facility] Cardiac and [facility] Rehab  Referring Provider [name] MD      Encounter Date: 08/27/2020  Check In:  Session Check In - 08/27/20 0755      Check-In   Supervising physician immediately available to respond to emergencies See telemetry face sheet for immediately available ER MD    Location [facility]iac [facility]    Staff Present Heath Lark, RN, BSN, Laveda Norman, BS, ACSM CEP, Exercise Physiologist;Joseph Tessie Fass RCP,RRT,BSRT    Virtual Visit No    Medication changes reported     No    Fall or balance concerns reported    No    Warm-up and Cool-down Performed on first and last piece of equipment    Resistance Training Performed Yes    VAD Patient? No    PAD/SET Patient? No      Pain Assessment   Currently in Pain? No/denies              Social History   Tobacco Use  Smoking Status Never Smoker  Smokeless Tobacco Never Used    Goals Met:  Independence with exercise equipment Exercise tolerated well Personal goals reviewed No report of cardiac concerns or symptoms  Goals Unmet:  Not Applicable  Comments: Pt able to follow exercise prescription today without complaint.  Will continue to monitor for progression.    Dr. Emily Filbert is Medical Director for [facility] and [facility].

## 2020-08-28 ENCOUNTER — Other Ambulatory Visit: Payer: Self-pay

## 2020-08-28 ENCOUNTER — Ambulatory Visit (HOSPITAL_COMMUNITY)
Admission: RE | Admit: 2020-08-28 | Discharge: 2020-08-28 | Disposition: A | Discharge status: Home / Self Care | Payer: BC Managed Care – PPO | Source: Ambulatory Visit | Attending: Interventional Radiology | Admitting: Interventional Radiology

## 2020-08-28 ENCOUNTER — Telehealth (HOSPITAL_COMMUNITY): Payer: Self-pay

## 2020-08-28 DIAGNOSIS — I639 Cerebral infarction, unspecified: Secondary | ICD-10-CM | POA: Insufficient documentation

## 2020-08-28 DIAGNOSIS — J341 Cyst and mucocele of nose and nasal sinus: Secondary | ICD-10-CM | POA: Diagnosis not present

## 2020-08-28 MED ORDER — IOHEXOL 350 MG/ML SOLN
75.0000 mL | Freq: Once | INTRAVENOUS | Status: AC | PRN
  Administered 2020-08-28: 75 mL via INTRAVENOUS

## 2020-08-28 NOTE — Telephone Encounter (Signed)
Pt agreed to f/u in 6 months with cta head/neck. She had questions about if she should continue Brilinta or not. I've forwarded the message to our PA to advise. PA will call pt if she can d/c medication or not. AW

## 2020-08-29 DIAGNOSIS — I5022 Chronic systolic (congestive) heart failure: Secondary | ICD-10-CM

## 2020-08-29 NOTE — Progress Notes (Signed)
Daily Session Note  Patient Details  Name: [name] MRN: [id number] Date of Birth: [birth year] Referring Provider:   Flowsheet Row Cardiac Rehab from 08/06/2020 in [facility] Cardiac and Pulmonary Rehab  Referring Provider [name] MD      Encounter Date: 08/29/2020  Check In:  Session Check In - 08/29/20 0719      Check-In   Supervising physician immediately available to respond to emergencies See telemetry face sheet for immediately available ER MD    Location [facility]Cardiac [facility] Pulmonary Re[facility]    Staff Present Birdie Sons, MPA, RN;Amanda Oletta Darter, BA, ACSM CEP, Exercise Physiologist;Jessica Luan Pulling, MA, RCEP, CCRP, CCET    Virtual Visit No    Medication changes reported     No    Fall or balance concerns reported    No    Warm-up and Cool-down Performed on first and last piece of equipment    Resistance Training Performed Yes    VAD Patient? No    PAD/SET Patient? No      Pain Assessment   Currently in Pain? No/denies              Social History   Tobacco Use  Smoking Status Never Smoker  Smokeless Tobacco Never Used    Goals Met:  Independence with exercise equipment Exercise tolerated well No report of cardiac concerns or symptoms Strength training completed today  Goals Unmet:  Not Applicable  Comments: Pt able to follow exercise prescription today without complaint.  Will continue to monitor for progression.    Dr. Emily Filbert is Medical Director for [facility] and [facility].

## 2020-08-30 ENCOUNTER — Encounter: Payer: Self-pay | Admitting: Internal Medicine

## 2020-08-30 ENCOUNTER — Ambulatory Visit (INDEPENDENT_AMBULATORY_CARE_PROVIDER_SITE_OTHER): Payer: BC Managed Care – PPO | Admitting: Internal Medicine

## 2020-08-30 ENCOUNTER — Other Ambulatory Visit: Payer: Self-pay

## 2020-08-30 VITALS — BP 90/60 | HR 83 | Ht 63.0 in | Wt 237.5 lb

## 2020-08-30 DIAGNOSIS — I5022 Chronic systolic (congestive) heart failure: Secondary | ICD-10-CM | POA: Diagnosis not present

## 2020-08-30 DIAGNOSIS — I472 Ventricular tachycardia, unspecified: Secondary | ICD-10-CM

## 2020-08-30 DIAGNOSIS — Z95810 Presence of automatic (implantable) cardiac defibrillator: Secondary | ICD-10-CM | POA: Diagnosis not present

## 2020-08-30 DIAGNOSIS — I428 Other cardiomyopathies: Secondary | ICD-10-CM

## 2020-08-30 DIAGNOSIS — I63312 Cerebral infarction due to thrombosis of left middle cerebral artery: Secondary | ICD-10-CM

## 2020-08-30 LAB — PACEMAKER DEVICE OBSERVATION

## 2020-08-30 MED ORDER — QUINIDINE GLUCONATE ER 324 MG PO TBCR: 324.0000 mg | EXTENDED_RELEASE_TABLET | Freq: Two times a day (BID) | ORAL | 6 refills | Status: DC

## 2020-08-30 MED ORDER — DIGOXIN 125 MCG PO TABS: ORAL_TABLET | ORAL | Status: DC

## 2020-08-30 MED ORDER — AMIODARONE HCL 200 MG PO TABS: ORAL_TABLET | ORAL | Status: DC

## 2020-08-30 NOTE — Patient Instructions (Addendum)
Medication Instructions:  - Your physician has recommended you make the following change in your medication:   1) DECREASE amiodarone 200 mg- take 1.5 tablets (300 mg) by mouth once daily  2) CHANGE digoxin 0.125- take 1 tablet (0.125 mg) by mouth once EVERY OTHER day  3) STOP mexiletine  4) START Quinidine 324 mg- take 1 tablet (324 mg) by mouth TWICE daily     *If you need a refill on your cardiac medications before your next appointment, please call your pharmacy*   Lab Work: - Your physician recommends that you have lab work today: CBC   If you have labs (blood work) drawn today and your tests are completely normal, you will receive your results only by: Marland Kitchen MyChart Message (if you have MyChart) OR . A paper copy in the mail If you have any lab test that is abnormal or we need to change your treatment, we will call you to review the results.   Testing/Procedures: - none ordered   Follow-Up: At [facility], you and your health needs are our priority.  As part of our continuing mission to provide you with exceptional heart care, we have created designated Provider Care Teams.  These Care Teams include your primary Cardiologist (physician) and Advanced Practice Providers (APPs -  Physician Assistants and Nurse Practitioners) who all work together to provide you with the care you need, when you need it.  We recommend signing up for the patient portal called "MyChart".  Sign up information is provided on this After Visit Summary.  MyChart is used to connect with patients for Virtual Visits (Telemedicine).  Patients are able to view lab/test results, encounter notes, upcoming appointments, etc.  Non-urgent messages can be sent to your provider as well.   To learn more about what you can do with MyChart, go to [email or web address].    Your next appointment:   4 week(s)  The format for your next appointment:   In Person  Provider:   Virl Axe, MD   Other  Instructions  Quinidine tablets What is this medicine? QUINIDINE (KWIN i deen) is an antiarrhythmic drug. It helps make your heart beat regularly. This medicine also helps to slow rapid heartbeats. It is also used to treat certain types of malaria. This medicine may be used for other purposes; ask your health care provider or pharmacist if you have questions. COMMON BRAND NAME(S): Dellia Beckwith What should I tell my health care provider before I take this medicine? They need to know if you have any of these conditions:  heart disease or previous heart attack  kidney disease  liver disease  low blood pressure  lung or breathing disease, like asthma  myasthenia gravis  you have a pacemaker  an unusual or allergic reaction to quinidine, quinine, other medicines, foods, dyes, or preservatives  pregnant or trying to get pregnant  breast-feeding How should I use this medicine? Take this medicine by mouth with a glass of water. Follow the directions on the prescription label. You can take this medicine with or without food. Take your doses at regular intervals. Do not take your medicine more often than directed. Do not stop taking this medicine suddenly. This may cause serious, heart-related side effects. Your doctor will tell you how much medicine to take. If your doctor wants you to stop the medicine, the dose will be slowly lowered over time to avoid any side effects. Talk to your pediatrician regarding the use of this medicine in children. Special care may  be needed. Overdosage: If you think you have taken too much of this medicine contact a poison control center or emergency room at once. NOTE: This medicine is only for you. Do not share this medicine with others. What if I miss a dose? If you miss a dose, take it as soon as you can. If it is almost time for your next dose, take only that dose. Do not take double or extra doses. What may interact with this medicine? Do not take this  medicine with any of the following medications:  abarelix  amiloride  amoxapine  apomorphine  arsenic trioxide  certain antifungal medicines like itraconazole or ketoconazole  certain quinolone antibiotics  cisapride  droperidol  haloperidol  [place]  levomethadyl  maprotiline  medicines for malaria like chloroquine and halofantrine  medicines for mental depression such as tricyclic antidepressants  mefloquine  methadone  other medicines to control heart rhythm  pentamidine  phenothiazines like chlorpromazine, mesoridazine, thioridazine  pimozide  ranolazine  sertindole  vardenafil  voriconazole This medicine may also interact with the following medications:  acetazolamide  antacids  barbiturates, like phenobarbital  cimetidine  diuretics  medicines for high blood pressure or angina  medicines for surgery that relax muscles or block pain  methazolamide  phenytoin  prescription pain medicines like codeine and hydrocodone  rifampin  sodium bicarbonate  warfarin  ziprasidone This list may not describe all possible interactions. Give your health care provider a list of all the medicines, herbs, non-prescription drugs, or dietary supplements you use. Also tell them if you smoke, drink alcohol, or use illegal drugs. Some items may interact with your medicine. What should I watch for while using this medicine? Visit your doctor or health care professional for regular checks on your progress. Wear a medical ID bracelet or chain, and carry a card that describes your disease and details of your medicine and dosage times. Check your blood pressure and pulse rate regularly. Ask your health care professional what your blood pressure and pulse rate should be, and when you should contact him or her. Your doctor or health care professional also may schedule regular blood tests and electrocardiograms to check your progress. You may get drowsy or  dizzy. Do not drive, use machinery, or do anything that needs mental alertness until you know how this medicine affects you. Do not stand or sit up quickly, especially if you are an older patient. This reduces the risk of dizzy or fainting spells. Alcohol may interfere with the effect of this medicine. Avoid alcoholic drinks. This medicine can make you more sensitive to the sun. Keep out of the sun. If you cannot avoid being in the sun, wear protective clothing and use sunscreen. Do not use sun lamps or tanning beds/booths. What side effects may I notice from receiving this medicine? Side effects that you should report to your doctor or health care professional as soon as possible:  breathing problems  changes in vision  confusion  feeling faint or lightheaded  fever  headache  irregular heartbeat  nausea, vomiting  pain on swallowing  skin rash, peeling or loose skin  ringing in the ears  unusual bruising or bleeding  unusually weak or tired Side effects that usually do not require medical attention (report to your doctor or health care professional if they continue or are bothersome):  diarrhea  flushing of the skin with intense itching  heartburn  loss of appetite  stomach pain This list may not describe all possible side  effects. Call your doctor for medical advice about side effects. You may report side effects to FDA at 1-800-FDA-1088. Where should I keep my medicine? Keep out of the reach of children. Store at room temperature between 15 and 30 degrees C (59 and 86 degrees F). Protect from light and moisture. Keep container tightly closed. Throw away any unused medicine after the expiration date. NOTE: This sheet is a summary. It may not cover all possible information. If you have questions about this medicine, talk to your doctor, pharmacist, or health care provider.  2021 Elsevier/Gold Standard (2018-06-01 09:04:58)

## 2020-08-30 NOTE — Progress Notes (Signed)
Patient Care Team: Patient, No Pcp Per as PCP - General (General Practice) Larey Dresser, MD as PCP - Cardiology (Cardiology) Deboraha Sprang, MD as PCP - Electrophysiology (Cardiology) Sueanne Margarita, MD as PCP - Sleep Medicine (Cardiology)   HPI  [name] is a 50 y.o. female seen in follow-up for  ICD implanted  With generator replacement 2015 and replacing [phone] also with a history of frequent ventricular ectopy and VT Rx polymorphic and monomorphic on amiodarone and adjunctive mexiletine  Has had recurrent shocks in the setting of hypokalemia most recently 8/21 Again with VT therapy 08/12/20 at which time potassium was presumed normal, 2/15--3.9  &  3/1--4.1  Reviewed with colleagues, not sure about the benefits of ablation, quinidine recommended is a thought for adjunctive medications as opposed to mexiletine.  Patient denies symptoms of GI intolerance, sun sensitivity, neurological symptoms attributable to amiodarone.   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  Question cardioembolic.    Underwent stenting.  On DAPT.  Follow-up scheduled 12/2.  Records and Results Reviewed   Past Medical History:  Diagnosis Date  . Automatic implantable cardioverter-defibrillator in situ   . Chronic CHF (congestive heart failure) (HCC)    a. EF 15-20% b. RHC (09/2013) RA 14, RV 57/22, PA 64/36 (48), PCWP 18, FIck CO/CI 3.7/1.6, PVR 8.1 WU, PA sat 47%   . History of stomach ulcers   . Hypertension   . Hypotension   .  Hypothyroidism   . Morbid obesity ([place])   . Myocardial infarction ([place]) 08/2013  . Nocturnal dyspnea   . Nonischemic cardiomyopathy ([place])   . Sinus tachycardia   . Sleep apnea   . Snoring-prob OSA 09/04/2011  . Sprint Fidelis ICD lead RECALL  [id number]   . UARS (upper airway resistance syndrome) 09/04/2011   HST 12/2013:  AHI 4/hr (numerous episodes of airflow reduction that did not have concomitant desaturation)     Past Surgical History:  Procedure Laterality Date  . BREATH TEK H PYLORI N/A 11/09/2014   Procedure: BREATH TEK H PYLORI;  Surgeon: Greer Pickerel, MD;  Location: [facility];  Service: General;  Laterality: N/A;  . CARDIAC CATHETERIZATION  ~ 2006; 09/2013  . CARDIAC CATHETERIZATION N/A 05/23/2016   Procedure: Right Heart Cath;  Surgeon: Larey Dresser, MD;  Location: [facility];  Service: Cardiovascular;  Laterality: N/A;  . CARDIAC DEFIBRILLATOR PLACEMENT  2006; 12/26/2013   Medtronic [id number]; 12-2013 ICD gen change and RV lead revision with new 7741 RV lead by Dr [name]  . CESAREAN SECTION  1999  . IMPLANTABLE CARDIOVERTER DEFIBRILLATOR GENERATOR CHANGE N/A 12/26/2013   Procedure: IMPLANTABLE CARDIOVERTER DEFIBRILLATOR GENERATOR CHANGE;  Surgeon: Deboraha Sprang, MD;  Location: [facility] CATH LAB;  Service: Cardiovascular;  Laterality: N/A;  . IR CT HEAD LTD  04/17/2020  . IR CT HEAD LTD  04/17/2020  . IR INTRA CRAN STENT  04/17/2020  . IR PERCUTANEOUS ART THROMBECTOMY/INFUSION INTRACRANIAL INC DIAG ANGIO  04/17/2020  . IR RADIOLOGIST EVAL & MGMT  06/05/2020  . LEAD REVISION N/A 12/26/2013   Procedure: LEAD REVISION;  Surgeon: Deboraha Sprang, MD;  Location: [facility] CATH LAB;  Service: Cardiovascular;  Laterality: N/A;  . RADIOLOGY WITH ANESTHESIA N/A 04/17/2020   Procedure: Code Stroke;  Surgeon: Luanne Bras, MD;  Location: [place];  Service: Radiology;  Laterality: N/A;  . RIGHT HEART CATHETERIZATION N/A 02/15/2014   Procedure: RIGHT HEART CATH;  Surgeon: Larey Dresser, MD;   Location: [facility] CATH LAB;  Service: Cardiovascular;  Laterality: N/A;  . TUBAL LIGATION  1999    Current Meds  Medication Sig  . amiodarone (PACERONE) 200 MG tablet Take 2 tablets (400 mg total) by mouth 2 (two) times daily for 14 days, THEN 2 tablets (400 mg total) daily for 14 days, THEN 1 tablet (200 mg total) daily.  Marland Kitchen aspirin 81 MG chewable tablet Chew 1 tablet (81 mg total) by mouth daily.  Marland Kitchen atorvastatin (LIPITOR) 40 MG tablet Take 40 mg by mouth daily.  . carvedilol (COREG) 6.25 MG tablet TAKE 1 TABLET BY MOUTH TWICE A DAY WITH A MEAL.  Marland Kitchen digoxin (LANOXIN) 0.125 MG tablet Take 0.5 tablets (0.0625 mg total) by mouth daily.  Marland Kitchen FARXIGA 10 MG TABS tablet TAKE 1 TABLET BY MOUTH ONCE A DAY BEFOREBREAKFAST  . levothyroxine (SYNTHROID) 25 MCG tablet Take 1 tablet (25 mcg total) by mouth daily before breakfast.  . metolazone (ZAROXOLYN) 2.5 MG tablet Take 1 tablet (2.5 mg total) by mouth as needed (extra fluid as directed by HF clinic).  Marland Kitchen mexiletine (MEXITIL) 150 MG capsule Take 1 capsule (150 mg total) by mouth 2 (two) times daily.  . potassium chloride SA (KLOR-CON) 20 MEQ tablet Take 40 mEq by mouth 2 (two) times daily.  . sacubitril-valsartan (ENTRESTO) 24-26 MG Take 1 tablet by mouth 2 (two) times daily.  Marland Kitchen spironolactone (ALDACTONE) 25 MG tablet Take 25 mg by mouth daily.  . ticagrelor (BRILINTA) 90 MG TABS tablet Take 1 tablet (90 mg total) by mouth 2 (two) times daily.  Marland Kitchen torsemide (DEMADEX) 20 MG tablet TAKE 4 TABLETS (80 MG) BY MOUTH IN THE MORNING.    No Known Allergies    Review of Systems negative except from HPI and PMH  Physical Exam BP 90/60 (BP Location: Left Arm, Patient Position: Sitting, Cuff Size: Large)   Pulse 83   Ht 5\' 3"  (1.6 m)   Wt 237 lb 8 oz (107.7 kg)   LMP 08/01/2020   SpO2 99%   BMI 42.07 kg/m  Well developed and well nourished in no acute distress HENT normal Neck supple with JVP-flat Clear Device pocket well healed; without hematoma or  erythema.  There is no tethering  Regular rate and rhythm, no  murmur Abd-soft with active BS No Clubbing cyanosis   edema Skin-warm and dry A & Oriented  Grossly normal sensory and motor function  ECG sinus at 83 Interval 20/14/42 with a QTC of about 490 Estimated Creatinine Clearance: 58.8 mL/min (A) (by C-G formula based on SCr of 1.36 mg/dL (H)).   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  The information was reviewed. No changes were made in the programming.  High Risk Medication Surveillance --amio/quinidine  6949-lead replaced   MR/TR  Stroke with intracranial stenting  Recurrent ventricular tachycardia.  Reviewed with colleagues.  Given the plethora of ventricular arrhythmias monomorphic and polymorphic reluctance to consider catheter ablation.  Thought was about using quinidine.  Reasonable.  We will stop mexiletine and begin.  Reviewed side effects including neutropenia and cinchonism; will check a CBC and differential.  Because of drug interactions with the digoxin will have her follow through with the recommendation to decrease her dosing from 0.125--0.0625 daily.  Euvolemic continue current meds      Current medicines are reviewed at length with the patient today .  The patient does not  have concerns regarding medicines.

## 2020-08-31 ENCOUNTER — Encounter: Payer: BC Managed Care – PPO | Admitting: *Deleted

## 2020-08-31 DIAGNOSIS — I5022 Chronic systolic (congestive) heart failure: Secondary | ICD-10-CM

## 2020-08-31 LAB — CBC WITH DIFFERENTIAL/PLATELET
Basophils Absolute: 0 x10E3/uL (ref 0.0–0.2)
Basos: 1 %
EOS (ABSOLUTE): 0.1 x10E3/uL (ref 0.0–0.4)
Eos: 1 %
Hematocrit: 34.9 % (ref 34.0–46.6)
Hemoglobin: 10.9 g/dL — ABNORMAL LOW (ref 11.1–15.9)
Immature Grans (Abs): 0 x10E3/uL (ref 0.0–0.1)
Immature Granulocytes: 0 %
Lymphocytes Absolute: 1.3 x10E3/uL (ref 0.7–3.1)
Lymphs: 15 %
MCH: 24.1 pg — ABNORMAL LOW (ref 26.6–33.0)
MCHC: 31.2 g/dL — ABNORMAL LOW (ref 31.5–35.7)
MCV: 77 fL — ABNORMAL LOW (ref 79–97)
Monocytes Absolute: 0.8 x10E3/uL (ref 0.1–0.9)
Monocytes: 9 %
Neutrophils Absolute: 6.3 x10E3/uL (ref 1.4–7.0)
Neutrophils: 74 %
Platelets: 413 x10E3/uL (ref 150–450)
RBC: 4.52 x10E6/uL (ref 3.77–5.28)
RDW: 16.7 % — ABNORMAL HIGH (ref 11.7–15.4)
WBC: 8.5 x10E3/uL (ref 3.4–10.8)

## 2020-08-31 NOTE — Progress Notes (Signed)
Daily Session Note  Patient Details  Name: [name] MRN: [id number] Date of Birth: 1971/03/02 Referring Provider:   Flowsheet Row Cardiac Rehab from 08/06/2020 in [facility] Cardiac and Pulmonary Rehab  Referring Provider [name] MD      Encounter Date: 08/31/2020  Check In:  Session Check In - 08/31/20 0821      Check-In   Supervising physician immediately available to respond to emergencies See telemetry face sheet for immediately available ER MD    Location [facility]-Cardiac & Pulmonary Rehab    Staff Present Heath Lark, RN, BSN, CCRP;Jessica [place], MA, RCEP, CCRP, CCET;Joseph Mondovi RCP,RRT,BSRT    Virtual Visit No    Medication changes reported     No    Fall or balance concerns reported    No    Warm-up and Cool-down Performed on first and last piece of equipment    Resistance Training Performed Yes    VAD Patient? No    PAD/SET Patient? No      Pain Assessment   Currently in Pain? No/denies              Social History   Tobacco Use  Smoking Status Never Smoker  Smokeless Tobacco Never Used    Goals Met:  Independence with exercise equipment Exercise tolerated well No report of cardiac concerns or symptoms  Goals Unmet:  Not Applicable  Comments: Pt able to follow exercise prescription today without complaint.  Will continue to monitor for progression.    Dr. Emily Filbert is Medical Director for [place] and [facility].

## 2020-09-03 ENCOUNTER — Encounter: Payer: BC Managed Care – PPO | Admitting: *Deleted

## 2020-09-03 ENCOUNTER — Other Ambulatory Visit: Payer: Self-pay

## 2020-09-03 DIAGNOSIS — I5022 Chronic systolic (congestive) heart failure: Secondary | ICD-10-CM | POA: Diagnosis not present

## 2020-09-03 NOTE — Progress Notes (Signed)
Daily Session Note  Patient Details  Name: [name] MRN: [id number] Date of Birth: Apr 15, 1971 Referring Provider:   Flowsheet Row Cardiac Rehab from 08/06/2020 in [facility] Cardiac and Pulmonary Rehab  Referring Provider [name] MD      Encounter Date: 09/03/2020  Check In:  Session Check In - 09/03/20 0844      Check-In   Supervising physician immediately available to respond to emergencies See telemetry face sheet for immediately available ER MD    Location [facility]-Cardiac & Pulmonary Rehab    Staff Present Heath Lark, RN, BSN, Laveda Norman, BS, ACSM CEP, Exercise Physiologist;Joseph Tessie Fass RCP,RRT,BSRT    Virtual Visit No    Medication changes reported     No    Fall or balance concerns reported    No    Warm-up and Cool-down Performed on first and last piece of equipment    Resistance Training Performed Yes    VAD Patient? No    PAD/SET Patient? No      Pain Assessment   Currently in Pain? No/denies              Social History   Tobacco Use  Smoking Status Never Smoker  Smokeless Tobacco Never Used    Goals Met:  Independence with exercise equipment Exercise tolerated well No report of cardiac concerns or symptoms  Goals Unmet:  Not Applicable  Comments: Pt able to follow exercise prescription today without complaint.  Will continue to monitor for progression.    Dr. Emily Filbert is Medical Director for [place] and [facility].

## 2020-09-05 ENCOUNTER — Other Ambulatory Visit: Payer: Self-pay

## 2020-09-05 DIAGNOSIS — I5022 Chronic systolic (congestive) heart failure: Secondary | ICD-10-CM

## 2020-09-05 NOTE — Progress Notes (Signed)
Daily Session Note  Patient Details  Name: [name] MRN: [id number] Date of Birth: 10/06/1970 Referring Provider:   Flowsheet Row Cardiac Rehab from 08/06/2020 in [facility] Cardiac and Pulmonary Rehab  Referring Provider [name] MD      Encounter Date: 09/05/2020  Check In:  Session Check In - 09/05/20 0726      Check-In   Supervising physician immediately available to respond to emergencies See telemetry face sheet for immediately available ER MD    Location [facility]-Cardiac & Pulmonary Rehab    Staff Present Birdie Sons, MPA, RN;Amanda Oletta Darter, BA, ACSM CEP, Exercise Physiologist;Joseph Tessie Fass RCP,RRT,BSRT    Virtual Visit No    Medication changes reported     No    Fall or balance concerns reported    No    Warm-up and Cool-down Performed on first and last piece of equipment    Resistance Training Performed Yes    VAD Patient? No    PAD/SET Patient? No      Pain Assessment   Currently in Pain? No/denies              Social History   Tobacco Use  Smoking Status Never Smoker  Smokeless Tobacco Never Used    Goals Met:  Independence with exercise equipment Exercise tolerated well No report of cardiac concerns or symptoms Strength training completed today  Goals Unmet:  Not Applicable  Comments: Pt able to follow exercise prescription today without complaint.  Will continue to monitor for progression.    Dr. Emily Filbert is Medical Director for [place] and [facility].

## 2020-09-07 ENCOUNTER — Other Ambulatory Visit: Payer: Self-pay

## 2020-09-07 ENCOUNTER — Encounter: Payer: BC Managed Care – PPO | Admitting: *Deleted

## 2020-09-07 DIAGNOSIS — I5022 Chronic systolic (congestive) heart failure: Secondary | ICD-10-CM

## 2020-09-07 NOTE — Progress Notes (Signed)
Daily Session Note  Patient Details  Name: [name] MRN: [id number] Date of Birth: June 10, 1971 Referring Provider:   Flowsheet Row Cardiac Rehab from 08/06/2020 in [facility] Cardiac and Pulmonary Rehab  Referring Provider [name] MD      Encounter Date: 09/07/2020  Check In:  Session Check In - 09/07/20 0912      Check-In   Supervising physician immediately available to respond to emergencies See telemetry face sheet for immediately available ER MD    Location [facility]-Cardiac & Pulmonary Rehab    Staff Present Heath Lark, RN, BSN, CCRP;Joseph Hood RCP,RRT,BSRT;Jessica [place], [place], [place], [place], CCET    Virtual Visit No    Medication changes reported     No    Fall or balance concerns reported    No    Warm-up and Cool-down Performed on first and last piece of equipment    Resistance Training Performed Yes    VAD Patient? No    PAD/SET Patient? No      Pain Assessment   Currently in Pain? No/denies              Social History   Tobacco Use  Smoking Status Never Smoker  Smokeless Tobacco Never Used    Goals Met:  Independence with exercise equipment Exercise tolerated well No report of cardiac concerns or symptoms  Goals Unmet:  Not Applicable  Comments: Pt able to follow exercise prescription today without complaint.  Will continue to monitor for progression.    Dr. Emily Filbert is Medical Director for [place] and [facility].

## 2020-09-10 ENCOUNTER — Encounter: Payer: BC Managed Care – PPO | Admitting: *Deleted

## 2020-09-10 ENCOUNTER — Other Ambulatory Visit: Payer: Self-pay

## 2020-09-10 DIAGNOSIS — I5022 Chronic systolic (congestive) heart failure: Secondary | ICD-10-CM

## 2020-09-10 NOTE — Progress Notes (Signed)
Daily Session Note  Patient Details  Name: [name] MRN: [id number] Date of Birth: 1970/09/22 Referring Provider:   Flowsheet Row Cardiac Rehab from 08/06/2020 in [facility] Cardiac and Pulmonary Rehab  Referring Provider [name] MD      Encounter Date: 09/10/2020  Check In:  Session Check In - 09/10/20 0828      Check-In   Supervising physician immediately available to respond to emergencies See telemetry face sheet for immediately available ER MD    Location [facility]-Cardiac & Pulmonary Rehab    Staff Present Heath Lark, RN, BSN, Laveda Norman, BS, ACSM CEP, Exercise Physiologist;Joseph Tessie Fass RCP,RRT,BSRT    Virtual Visit No    Medication changes reported     No    Fall or balance concerns reported    No    Warm-up and Cool-down Performed on first and last piece of equipment    Resistance Training Performed Yes    VAD Patient? No    PAD/SET Patient? No      Pain Assessment   Currently in Pain? No/denies              Social History   Tobacco Use  Smoking Status Never Smoker  Smokeless Tobacco Never Used    Goals Met:  Independence with exercise equipment Exercise tolerated well No report of cardiac concerns or symptoms  Goals Unmet:  Not Applicable  Comments: Pt able to follow exercise prescription today without complaint.  Will continue to monitor for progression.    Dr. Emily Filbert is Medical Director for [facility].

## 2020-09-12 ENCOUNTER — Encounter: Payer: Self-pay | Admitting: *Deleted

## 2020-09-12 ENCOUNTER — Other Ambulatory Visit: Payer: Self-pay

## 2020-09-12 DIAGNOSIS — I5022 Chronic systolic (congestive) heart failure: Secondary | ICD-10-CM

## 2020-09-12 NOTE — Progress Notes (Signed)
Cardiac Individual Treatment Plan  Patient Details  Name: [name] MRN: [id number] Date of Birth: 09-13-1970 Referring Provider:   Flowsheet Row Cardiac Rehab from 08/06/2020 in [facility] Cardiac and Pulmonary Rehab  Referring Provider [name] MD      Initial Encounter Date:  Flowsheet Row Cardiac Rehab from 08/06/2020 in [facility] Cardiac and Pulmonary Rehab  Date 08/06/20      Visit Diagnosis: Heart failure, chronic systolic (HCC)  Patient's Home Medications on Admission:  Current Outpatient Medications:  .  amiodarone (PACERONE) 200 MG tablet, Take 1.5 tablets (300 mg) by mouth once daily, Disp: , Rfl:  .  aspirin 81 MG chewable tablet, Chew 1 tablet (81 mg total) by mouth daily., Disp: 30 tablet, Rfl: 11 .  atorvastatin (LIPITOR) 40 MG tablet, Take 40 mg by mouth daily., Disp: , Rfl:  .  carvedilol (COREG) 6.25 MG tablet, TAKE 1 TABLET BY MOUTH TWICE A DAY WITH A MEAL., Disp: 60 tablet, Rfl: 0 .  digoxin (LANOXIN) 0.125 MG tablet, Take 1 tablet (0.125 mg) by mouth once every other day, Disp: , Rfl:  .  FARXIGA 10 MG TABS tablet, TAKE 1 TABLET BY MOUTH ONCE A DAY BEFOREBREAKFAST, Disp: 30 tablet, Rfl: 5 .  levothyroxine (SYNTHROID) 25 MCG tablet, Take 1 tablet (25 mcg total) by mouth daily before breakfast., Disp: 90 tablet, Rfl: 3 .  metolazone (ZAROXOLYN) 2.5 MG tablet, Take 1 tablet (2.5 mg total) by mouth as needed (extra fluid as directed by HF clinic)., Disp: 5 tablet, Rfl: 3 .  potassium chloride SA (KLOR-CON) 20 MEQ tablet, Take 40 mEq by mouth 2 (two) times daily., Disp: , Rfl:  .  quiniDINE gluconate 324 MG CR tablet, Take 1 tablet (324 mg total) by mouth 2 (two) times daily., Disp: 60 tablet, Rfl: 6 .  sacubitril-valsartan (ENTRESTO) 24-26 MG, Take 1 tablet by mouth 2 (two) times daily., Disp: 60 tablet, Rfl: 3 .  spironolactone (ALDACTONE) 25 MG tablet, Take 25 mg by mouth daily., Disp: , Rfl:  .  ticagrelor (BRILINTA) 90 MG TABS tablet, Take 1 tablet (90 mg total) by  mouth 2 (two) times daily., Disp: 60 tablet, Rfl: 11 .  torsemide (DEMADEX) 20 MG tablet, TAKE 4 TABLETS (80 MG) BY MOUTH IN THE MORNING., Disp: 90 tablet, Rfl: 0  Past Medical History: Past Medical History:  Diagnosis Date  . Automatic implantable cardioverter-defibrillator in situ   . Chronic CHF (congestive heart failure) (HCC)    a. EF 15-20% b. RHC (09/2013) RA 14, RV 57/22, PA 64/36 (48), PCWP 18, FIck CO/CI 3.7/1.6, PVR 8.1 WU, PA sat 47%   . History of stomach ulcers   . Hypertension   . Hypotension   . Hypothyroidism   . Morbid obesity ([place])   . Myocardial infarction ([place]) 08/2013  . Nocturnal dyspnea   . Nonischemic cardiomyopathy ([place])   . Sinus tachycardia   . Sleep apnea   . Snoring-prob OSA 09/04/2011  . Sprint [employer] ICD lead RECALL  [id number]   . UARS (upper airway resistance syndrome) 09/04/2011   HST 12/2013:  AHI 4/hr (numerous episodes of airflow reduction that did not have concomitant desaturation)     Tobacco Use: Social History   Tobacco Use  Smoking Status Never Smoker  Smokeless Tobacco Never Used    Labs: Recent Review Flowsheet Data    Labs for ITP Cardiac and Pulmonary Rehab Latest Ref Rng & Units 04/17/2020 04/17/2020 04/17/2020 04/18/2020 08/21/2020   Cholestrol 0 - 200 mg/dL - - -  198 -   LDLCALC 0 - 99 mg/dL - - - 130(H) -   HDL >40 mg/dL - - - 50 -   Trlycerides <150 mg/dL - - 97 92 -   Hemoglobin A1c 4.8 - 5.6 % - - - 5.5 -   PHART 7.350 - 7.450 - 7.341(L) - - 7.414   PCO2ART 32.0 - 48.0 mmHg - 41.4 - - 34.8   HCO3 20.0 - 28.0 mmol/L - 22.4 - - 21.8   TCO2 22 - 32 mmol/L 23 24 - - -   ACIDBASEDEF 0.0 - 2.0 mmol/L - 3.0(H) - - 2.0   O2SAT % - 99.0 - - 98.1       Exercise Target Goals: Exercise Program Goal: Individual exercise prescription set using results from initial 6 min walk test and THRR while considering  patient's activity barriers and safety.   Exercise Prescription Goal: Initial exercise prescription builds to 30-45 minutes a  day of aerobic activity, 2-3 days per week.  Home exercise guidelines will be given to patient during program as part of exercise prescription that the participant will acknowledge.   Education: Aerobic Exercise: - Group verbal and visual presentation on the components of exercise prescription. Introduces F.I.T.T principle from ACSM for exercise prescriptions.  Reviews F.I.T.T. principles of aerobic exercise including progression. Written material given at graduation. Flowsheet Row [facility]iac [facility]iac [facility]  Date 08/08/20  Educator [facility]  Instruction Review Code 1- Verbalizes Understanding      Education: Resistance Exercise: - Group verbal and visual presentation on the components of exercise prescription. Introduces F.I.T.T principle from ACSM for exercise prescriptions  Reviews F.I.T.T. principles of resistance exercise including progression. Written material given at graduation.    Education: Exercise & Equipment Safety: - Individual verbal instruction and demonstration of equipment use and safety with use of the equipment. Flowsheet Row Cardiac Rehab from 09/05/2020 in [facility] Cardiac and Pulmonary Rehab  Date 07/26/20  Educator [facility]  Instruction Review Code 1- Verbalizes Understanding      Education: Exercise Physiology & General Exercise Guidelines: - Group verbal and written instruction with models to review the exercise physiology of the cardiovascular system and associated critical values. Provides general exercise guidelines with specific guidelines to those with heart or lung disease.    Education: Flexibility, Balance, Mind/Body Relaxation: - Group verbal and visual presentation with interactive activity on the components of exercise prescription. Introduces F.I.T.T principle from ACSM for exercise prescriptions. Reviews F.I.T.T. principles of flexibility and balance exercise training including progression. Also discusses the mind body  connection.  Reviews various relaxation techniques to help reduce and manage stress (i.e. Deep breathing, progressive muscle relaxation, and visualization). Balance handout provided to take home. Written material given at graduation. Flowsheet Row Cardiac Rehab from 09/05/2020 in [facility] Cardiac and Pulmonary Rehab  Date 08/22/20  Educator [facility]  Instruction Review Code 1- Verbalizes Understanding      Activity Barriers & Risk Stratification:  Activity Barriers & Cardiac Risk Stratification - 08/06/20 1409      Activity Barriers & Cardiac Risk Stratification   Activity Barriers Deconditioning;Muscular Weakness;Balance Concerns;Shortness of Breath;Decreased Ventricular Function;Other (comment)    Comments occasional delays responding since stroke    Cardiac Risk Stratification High           6 Minute Walk:  6 Minute Walk    Row Name 08/06/20 1408         6 Minute Walk   Phase Initial  Distance 1240 feet     Walk Time 6 minutes     # of Rest Breaks 0     MPH 2.35     METS 3.58     RPE 11     Perceived Dyspnea  1     VO2 Peak 12.52     Symptoms Yes (comment)     Comments hand tingling 2-3/10 (since CVA), SOB     Resting HR 99 bpm     Resting BP 108/66     Resting Oxygen Saturation  100 %     Exercise Oxygen Saturation  during 6 min walk 98 %     Max Ex. HR 128 bpm     Max Ex. BP 146/74     2 Minute Post BP 110/60            Oxygen Initial Assessment:   Oxygen Re-Evaluation:   Oxygen Discharge (Final Oxygen Re-Evaluation):   Initial Exercise Prescription:  Initial Exercise Prescription - 08/06/20 1400      Date of Initial Exercise RX and Referring Provider   Date 08/06/20    Referring Provider [name] MD      Treadmill   MPH 2.3    Grade 1    Minutes 15    METs 3.08      NuStep   Level 3    SPM 80    Minutes 15    METs 3      REL-XR   Level 2    Speed 50    Minutes 15    METs 3      Biostep-RELP   Level 3    SPM 50    Minutes 15     METs 3      Prescription Details   Frequency (times per week) 3    Duration Progress to 30 minutes of continuous aerobic without signs/symptoms of physical distress      Intensity   THRR 40-80% of Max Heartrate 128-157    Ratings of Perceived Exertion 11-13    Perceived Dyspnea 0-4      Progression   Progression Continue to progress workloads to maintain intensity without signs/symptoms of physical distress.      Resistance Training   Training Prescription Yes    Weight 3 lb    Reps 10-15           Perform Capillary Blood Glucose checks as needed.  Exercise Prescription Changes:  Exercise Prescription Changes    Row Name 08/06/20 1400 08/14/20 0800 08/22/20 0700 08/27/20 1200 09/10/20 0800     Response to Exercise   Blood Pressure (Admit) 108/66 144/74 -- 118/70 120/68   Blood Pressure (Exercise) 146/74 140/64 -- 120/72 124/56   Blood Pressure (Exit) 110/60 124/70 -- 102/64 122/60   Heart Rate (Admit) 99 bpm 104 bpm -- 89 bpm 93 bpm   Heart Rate (Exercise) 128 bpm 138 bpm -- 131 bpm 115 bpm   Heart Rate (Exit) 99 bpm 117 bpm -- 83 bpm 85 bpm   Oxygen Saturation (Admit) 100 % -- -- -- --   Oxygen Saturation (Exercise) 98 % -- -- -- --   Rating of Perceived Exertion (Exercise) 11 12 -- 12 13   Perceived Dyspnea (Exercise) 1 -- -- -- --   Symptoms SOB, hand tingling (2-3/10) none -- none none   Comments walk test results second full day of exercise -- -- --   Duration -- Continue with 30 min of aerobic exercise  without signs/symptoms of physical distress. -- Continue with 30 min of aerobic exercise without signs/symptoms of physical distress. Continue with 30 min of aerobic exercise without signs/symptoms of physical distress.   Intensity -- THRR unchanged -- THRR unchanged THRR unchanged     Progression   Progression -- Continue to progress workloads to maintain intensity without signs/symptoms of physical distress. -- Continue to progress workloads to maintain  intensity without signs/symptoms of physical distress. Continue to progress workloads to maintain intensity without signs/symptoms of physical distress.   Average METs -- 2.5 -- 3 2.61     Resistance Training   Training Prescription -- Yes -- Yes Yes   Weight -- 3 lb -- 4 lb 4 lb   Reps -- 10-15 -- 10-15 10-15     Interval Training   Interval Training -- No -- No No     Treadmill   MPH -- 2.3 -- 2.3 2   Grade -- 1 -- 1 1   Minutes -- 15 -- 15 15   METs -- 3.08 -- 3.08 2.72     NuStep   Level -- 3 -- -- 5   Minutes -- 15 -- -- 15   METs -- 2.6 -- -- 2.4     REL-XR   Level -- 2 -- 2 1   Minutes -- 15 -- 15 15   METs -- 2.3 -- 3 --     Biostep-RELP   Level -- 2 -- -- 3   Minutes -- 15 -- -- 15   METs -- 2 -- -- --     Home Exercise Plan   Plans to continue exercise at -- -- Home (comment)  walking, biking, videos, treadmill -- Home (comment)  walking, biking, videos, treadmill   Frequency -- -- Add 2 additional days to program exercise sessions. -- Add 2 additional days to program exercise sessions.   Initial Home Exercises Provided -- -- 08/22/20 -- 08/22/20          Exercise Comments:  Exercise Comments    Row Name 08/08/20 [id number]           Exercise Comments First full day of exercise!  Patient was oriented to gym and equipment including functions, settings, policies, and procedures.  Patient's individual exercise prescription and treatment plan were reviewed.  All starting workloads were established based on the results of the 6 minute walk test done at initial orientation visit.  The plan for exercise progression was also introduced and progression will be customized based on patient's performance and goals.              Exercise Goals and Review:  Exercise Goals    Row Name 08/06/20 1412             Exercise Goals   Increase Physical Activity Yes       Intervention Provide advice, education, support and counseling about physical activity/exercise  needs.;Develop an individualized exercise prescription for aerobic and resistive training based on initial evaluation findings, risk stratification, comorbidities and participant's personal goals.       Expected Outcomes Short Term: Attend rehab on a regular basis to increase amount of physical activity.;Long Term: Add in home exercise to make exercise part of routine and to increase amount of physical activity.;Long Term: Exercising regularly at least 3-5 days a week.       Increase Strength and Stamina Yes       Intervention Provide advice, education, support and counseling about physical activity/exercise needs.;Develop  an individualized exercise prescription for aerobic and resistive training based on initial evaluation findings, risk stratification, comorbidities and participant's personal goals.       Expected Outcomes Short Term: Increase workloads from initial exercise prescription for resistance, speed, and METs.;Short Term: Perform resistance training exercises routinely during rehab and add in resistance training at home;Long Term: Improve cardiorespiratory fitness, muscular endurance and strength as measured by increased METs and functional capacity (6MWT)       Able to understand and use rate of perceived exertion (RPE) scale Yes       Intervention Provide education and explanation on how to use RPE scale       Expected Outcomes Short Term: Able to use RPE daily in rehab to express subjective intensity level;Long Term:  Able to use RPE to guide intensity level when exercising independently       Able to understand and use Dyspnea scale Yes       Intervention Provide education and explanation on how to use Dyspnea scale       Expected Outcomes Short Term: Able to use Dyspnea scale daily in rehab to express subjective sense of shortness of breath during exertion;Long Term: Able to use Dyspnea scale to guide intensity level when exercising independently       Knowledge and understanding of  Target Heart Rate Range (THRR) Yes       Intervention Provide education and explanation of THRR including how the numbers were predicted and where they are located for reference       Expected Outcomes Short Term: Able to state/look up THRR;Short Term: Able to use daily as guideline for intensity in rehab;Long Term: Able to use THRR to govern intensity when exercising independently       Able to check pulse independently Yes       Intervention Provide education and demonstration on how to check pulse in carotid and radial arteries.;Review the importance of being able to check your own pulse for safety during independent exercise       Expected Outcomes Short Term: Able to explain why pulse checking is important during independent exercise;Long Term: Able to check pulse independently and accurately       Understanding of Exercise Prescription Yes       Intervention Provide education, explanation, and written materials on patient's individual exercise prescription       Expected Outcomes Short Term: Able to explain program exercise prescription;Long Term: Able to explain home exercise prescription to exercise independently              Exercise Goals Re-Evaluation :  Exercise Goals Re-Evaluation    Row Name 08/08/20 0728 08/14/20 0828 08/22/20 0750 08/27/20 0755 09/10/20 0838     Exercise Goal Re-Evaluation   Exercise Goals Review Increase Physical Activity;Able to understand and use rate of perceived exertion (RPE) scale;Understanding of Exercise Prescription;Knowledge and understanding of Target Heart Rate Range (THRR);Increase Strength and Stamina;Able to understand and use Dyspnea scale;Able to check pulse independently Increase Physical Activity;Increase Strength and Stamina;Understanding of Exercise Prescription Increase Physical Activity;Increase Strength and Stamina;Understanding of Exercise Prescription;Able to understand and use rate of perceived exertion (RPE) scale;Able to understand and  use Dyspnea scale;Knowledge and understanding of Target Heart Rate Range (THRR);Able to check pulse independently -- Increase Physical Activity;Increase Strength and Stamina;Understanding of Exercise Prescription   Comments Reviewed RPE and dyspnea scales, THR and program prescription with pt today.  Pt voiced understanding and was given a copy of goals to take home. [name]  is off to a good start in rehab.  She has completed her first two full days of exercise.  We will continue to montior her progress. Reviewed home exercise with pt today.  Pt plans to walk and use equipment at home for exercise.  We also talked about using staff videos too. Reviewed THR, pulse, RPE, sign and symptoms, pulse oximetery and when to call 911 or MD.  Also discussed weather considerations and indoor options.  Pt voiced understanding. Malya is using her TM at home for 20 - 30 min.  We reviewed THR range and how to check pulse. Mckinze is doing well in rehab.  She is up to level 5 on the NuStep.  We have not gotten her METs off the XR and will teach her how to find them at the end of her workout.  We will continue to montior her progress.   Expected Outcomes Short: Use RPE daily to regulate intensity. Long: Follow program prescription in THR. Short: Continue to attend rehab regularly  Long: Continue to follow program prescription. Short: Start to add in exercise at home. Long; Continue to improve stamina. Short: monitor HR while exercising Long:  maintain consistent exercise Short: Report METs off XR Long: Continue to improve stamina          Discharge Exercise Prescription (Final Exercise Prescription Changes):  Exercise Prescription Changes - 09/10/20 0800      Response to Exercise   Blood Pressure (Admit) 120/68    Blood Pressure (Exercise) 124/56    Blood Pressure (Exit) 122/60    Heart Rate (Admit) 93 bpm    Heart Rate (Exercise) 115 bpm    Heart Rate (Exit) 85 bpm    Rating of Perceived Exertion (Exercise) 13     Symptoms none    Duration Continue with 30 min of aerobic exercise without signs/symptoms of physical distress.    Intensity THRR unchanged      Progression   Progression Continue to progress workloads to maintain intensity without signs/symptoms of physical distress.    Average METs 2.61      Resistance Training   Training Prescription Yes    Weight 4 lb    Reps 10-15      Interval Training   Interval Training No      Treadmill   MPH 2    Grade 1    Minutes 15    METs 2.72      NuStep   Level 5    Minutes 15    METs 2.4      REL-XR   Level 1    Minutes 15      Biostep-RELP   Level 3    Minutes 15      Home Exercise Plan   Plans to continue exercise at Home (comment)   walking, biking, videos, treadmill   Frequency Add 2 additional days to program exercise sessions.    Initial Home Exercises Provided 08/22/20           Nutrition:  Target Goals: Understanding of nutrition guidelines, daily intake of sodium 1500mg , cholesterol 200mg , calories 30% from fat and 7% or less from saturated fats, daily to have 5 or more servings of fruits and vegetables.  Education: All About Nutrition: -Group instruction provided by verbal, written material, interactive activities, discussions, models, and posters to present general guidelines for heart healthy nutrition including fat, fiber, MyPlate, the role of sodium in heart healthy nutrition, utilization of the nutrition label, and utilization of this  knowledge for meal planning. Follow up email sent as well. Written material given at graduation. Flowsheet Row Cardiac Rehab from 09/05/2020 in [facility] Cardiac and Pulmonary Rehab  Education need identified 08/06/20  Date 08/29/20  Educator [facility]  Instruction Review Code 1- Verbalizes Understanding      Biometrics:  Pre Biometrics - 08/06/20 1421      Pre Biometrics   Height 5' 4.1" (1.628 m)    Weight 240 lb (108.9 kg)    BMI (Calculated) 41.07    Single Leg Stand 30 seconds             Nutrition Therapy Plan and Nutrition Goals:  Nutrition Therapy & Goals - 08/06/20 0854      Nutrition Therapy   Diet Heart healthy, low Na    Drug/Food Interactions Statins/Certain Fruits    Protein (specify units) 85g    Fiber 25 grams    Whole Grain Foods 3 servings    Saturated Fats 12 max. grams    Fruits and Vegetables 8 servings/day    Sodium 1.5 grams      Personal Nutrition Goals   Nutrition Goal ST: get back into routine of eating form the wellness center LT: meet kcal and protein needs, increase energy    Comments She reports not eating much right now - textures are hard for her since her stroke. B: milk and protein bar L: salad with protein (chef salad) D: she may or may not eat dinner - 3-4x/week: grilled chicken or pork loin with vegetable and starch ( she reports her husband is a "health nut"). She reports not feeling her hunger cues right now since the stroke. She had been doing the diet from the health and wellness center and now she is not eating much. Her memory has also been poor. The diet was high protein -B: eggs and milk, toast (whole wheat) with a yogurt. L: lean cuisine with salad S: protein bars and string cheese D: 8-10 oz of lean meat, 1-2 cups of vegetables, and 1/2 cup beans. She was monitored by MDs and RDs as part of the program. Reviewed heart healthy eating. She likes the routine she was doing before her surgery and encouraged her to get back to that using mechanical hunger - eating outside of hunger since it is dulled.      Intervention Plan   Intervention Prescribe, educate and counsel regarding individualized specific dietary modifications aiming towards targeted core components such as weight, hypertension, lipid management, diabetes, heart failure and other comorbidities.;Nutrition handout(s) given to patient.    Expected Outcomes Short Term Goal: Understand basic principles of dietary content, such as calories, fat, sodium, cholesterol and  nutrients.;Short Term Goal: A plan has been developed with personal nutrition goals set during dietitian appointment.;Long Term Goal: Adherence to prescribed nutrition plan.           Nutrition Assessments:  MEDIFICTS Score Key:  ?70 Need to make dietary changes   40-70 Heart Healthy Diet  ? 40 Therapeutic Level Cholesterol Diet  Flowsheet Row Cardiac Rehab from 08/06/2020 in [facility] Cardiac and Pulmonary Rehab  Picture Your Plate Total Score on Admission 75     Picture Your Plate Scores:  <62 Unhealthy dietary pattern with much room for improvement.  41-50 Dietary pattern unlikely to meet recommendations for good health and room for improvement.  51-60 More healthful dietary pattern, with some room for improvement.   >60 Healthy dietary pattern, although there may be some specific behaviors that could be improved.  Nutrition Goals Re-Evaluation:  Nutrition Goals Re-Evaluation    [place] Name 08/27/20 [phone]             Goals   Nutrition Goal Short: continue to monitor weight and protein Long:  reach goal weight       Comment Sharese is eating a meat and vegetables at meals.  She says thye grilled lots o fmeat and vegetables yesterday to have for the week.  Her weight fluctuates alot.  She does send the RN a note if her weight is up and she usually takes an extra fluid pill.  She has increased protein intake.              Nutrition Goals Discharge (Final Nutrition Goals Re-Evaluation):  Nutrition Goals Re-Evaluation - 08/27/20 0752      Goals   Nutrition Goal Short: continue to monitor weight and protein Long:  reach goal weight    Comment Candiss is eating a meat and vegetables at meals.  She says thye grilled lots o fmeat and vegetables yesterday to have for the week.  Her weight fluctuates alot.  She does send the RN a note if her weight is up and she usually takes an extra fluid pill.  She has increased protein intake.           Psychosocial: Target Goals:  Acknowledge presence or absence of significant depression and/or stress, maximize coping skills, provide positive support system. Participant is able to verbalize types and ability to use techniques and skills needed for reducing stress and depression.   Education: Stress, Anxiety, and Depression - Group verbal and visual presentation to define topics covered.  Reviews how body is impacted by stress, anxiety, and depression.  Also discusses healthy ways to reduce stress and to treat/manage anxiety and depression.  Written material given at graduation. Flowsheet Row Cardiac Rehab from 09/05/2020 in [facility] Cardiac and Pulmonary Rehab  Education need identified 08/06/20      Education: Sleep Hygiene -Provides group verbal and written instruction about how sleep can affect your health.  Define sleep hygiene, discuss sleep cycles and impact of sleep habits. Review good sleep hygiene tips.    Initial Review & Psychosocial Screening:  Initial Psych Review & Screening - 07/26/20 0902      Initial Review   Current issues with None Identified      Family Dynamics   Good Support System? Yes    Comments Patient has had a stroke last year and has not done much since then. She can look to her husband, son and daughter for support. She has a somewhat positive outlook on her health.      Barriers   Psychosocial barriers to participate in program The patient should benefit from training in stress management and relaxation.;There are no identifiable barriers or psychosocial needs.      Screening Interventions   Interventions Encouraged to exercise;Provide feedback about the scores to participant;To provide support and resources with identified psychosocial needs           Quality of Life Scores:   Quality of Life - 08/06/20 1421      Quality of Life   Select Quality of Life      Quality of Life Scores   Health/Function Pre 20.57 %    Socioeconomic Pre 29 %    Psych/Spiritual Pre 28.29 %     Family Pre 30 %    GLOBAL Pre 25.19 %          Scores  of 19 and below usually indicate a poorer quality of life in these areas.  A difference of  2-3 points is a clinically meaningful difference.  A difference of 2-3 points in the total score of the Quality of Life Index has been associated with significant improvement in overall quality of life, self-image, physical symptoms, and general health in studies assessing change in quality of life.  PHQ-9: Recent Review Flowsheet Data    Depression screen [facility] 2/9 09/03/2020 08/06/2020 10/05/2017   Decreased Interest 0 0 1   Down, Depressed, Hopeless 0 0 0   PHQ - 2 Score 0 0 1   Altered sleeping 1 1 1    Tired, decreased energy 1 2 2    Change in appetite 0 1 -   Feeling bad or failure about yourself  0 0 1   Trouble concentrating 0 0 0   Moving slowly or fidgety/restless 0 1 0   Suicidal thoughts 0 0 0   PHQ-9 Score 2 5 5    Difficult doing work/chores Not difficult at all Not difficult at all Somewhat difficult     Interpretation of Total Score  Total Score Depression Severity:  1-4 = Minimal depression, 5-9 = Mild depression, 10-14 = Moderate depression, 15-19 = Moderately severe depression, 20-27 = Severe depression   Psychosocial Evaluation and Intervention:  Psychosocial Evaluation - 07/26/20 0904      Psychosocial Evaluation & Interventions   Interventions Encouraged to exercise with the program and follow exercise prescription;Stress management education;Relaxation education    Comments Patient has had a stroke last year and has not done much since then. She can look to her husband, son and daughter for support. She has a somewhat positive outlook on her health.    Expected Outcomes Short: Exercise regularly to support mental health and notify staff of any changes. Long: maintain mental health and well being through teaching of rehab or prescribed medications independently.    Continue Psychosocial Services  Follow up required by  staff           Psychosocial Re-Evaluation:  Psychosocial Re-Evaluation    [place] Name 08/27/20 0800 09/03/20 0740           Psychosocial Re-Evaluation   Current issues with Current Stress Concerns;Current Sleep Concerns None Identified      Comments Hadia reports no major stress.  Her family is a good support system.  She has some trouble sleeping - has a machine for sleep apnea. Reviewed patient health questionnaire (PHQ-9) with patient for follow up. Previously, patients score indicated signs/symptoms of depression.  Reviewed to see if patient is improving symptom wise while in program.  Score improved and patient states that it is because she has had more energy due to exercising more in rehab.      Expected Outcomes Short: work on better sleep patterns Long:  maintain positive outlook Short: Continue to attend [facility] regularly for regular exercise and social engagement. Long: Continue to improve symptoms and manage a positive mental state.      Interventions -- Encouraged to attend Cardiac Rehabilitation for the exercise      Continue Psychosocial Services  -- No Follow up required             Psychosocial Discharge (Final Psychosocial Re-Evaluation):  Psychosocial Re-Evaluation - 09/03/20 0740      Psychosocial Re-Evaluation   Current issues with None Identified    Comments Reviewed patient health questionnaire (PHQ-9) with patient for follow up. Previously, patients score indicated signs/symptoms  of depression.  Reviewed to see if patient is improving symptom wise while in program.  Score improved and patient states that it is because she has had more energy due to exercising more in rehab.    Expected Outcomes Short: Continue to attend [facility] regularly for regular exercise and social engagement. Long: Continue to improve symptoms and manage a positive mental state.    Interventions Encouraged to attend Cardiac Rehabilitation for the exercise    Continue Psychosocial  Services  No Follow up required           Vocational Rehabilitation: Provide vocational rehab assistance to qualifying candidates.   Vocational Rehab Evaluation & Intervention:   Education: Education Goals: Education classes will be provided on a variety of topics geared toward better understanding of heart health and risk factor modification. Participant will state understanding/return demonstration of topics presented as noted by education test scores.  Learning Barriers/Preferences:  Learning Barriers/Preferences - 07/26/20 0901      Learning Barriers/Preferences   Learning Barriers None    Learning Preferences None           General Cardiac Education Topics:  AED/CPR: - Group verbal and written instruction with the use of models to demonstrate the basic use of the AED with the basic ABC's of resuscitation.   Anatomy and Cardiac Procedures: - Group verbal and visual presentation and models provide information about basic cardiac anatomy and function. Reviews the testing methods done to diagnose heart disease and the outcomes of the test results. Describes the treatment choices: Medical Management, Angioplasty, or Coronary Bypass Surgery for treating various heart conditions including Myocardial Infarction, Angina, Valve Disease, and Cardiac Arrhythmias.  Written material given at graduation. Flowsheet Row Cardiac Rehab from 09/05/2020 in [facility] Cardiac and Pulmonary Rehab  Education need identified 08/06/20      Medication Safety: - Group verbal and visual instruction to review commonly prescribed medications for heart and lung disease. Reviews the medication, class of the drug, and side effects. Includes the steps to properly store meds and maintain the prescription regimen.  Written material given at graduation. Flowsheet Row Cardiac Rehab from 09/05/2020 in [facility] Cardiac and Pulmonary Rehab  Date 09/05/20  Educator SB  Instruction Review Code 1- Verbalizes Understanding       Intimacy: - Group verbal instruction through game format to discuss how heart and lung disease can affect sexual intimacy. Written material given at graduation.. Flowsheet Row Cardiac Rehab from 09/05/2020 in [facility] Cardiac and Pulmonary Rehab  Date 08/08/20  Educator [facility]  Instruction Review Code 1- Verbalizes Understanding      Know Your Numbers and Heart Failure: - Group verbal and visual instruction to discuss disease risk factors for cardiac and pulmonary disease and treatment options.  Reviews associated critical values for Overweight/Obesity, Hypertension, Cholesterol, and Diabetes.  Discusses basics of heart failure: signs/symptoms and treatments.  Introduces Heart Failure Zone chart for action plan for heart failure.  Written material given at graduation.   Infection Prevention: - Provides verbal and written material to individual with discussion of infection control including proper hand washing and proper equipment cleaning during exercise session. Flowsheet Row Cardiac Rehab from 09/05/2020 in [facility] Cardiac and Pulmonary Rehab  Date 07/26/20  Educator [facility]  Instruction Review Code 1- Verbalizes Understanding      Falls Prevention: - Provides verbal and written material to individual with discussion of falls prevention and safety. Flowsheet Row Cardiac Rehab from 09/05/2020 in [facility] Cardiac and Pulmonary Rehab  Date 07/26/20  Educator [facility]  Instruction Review Code 1- Verbalizes Understanding      Other: -Provides group and verbal instruction on various topics (see comments)   Knowledge Questionnaire Score:  Knowledge Questionnaire Score - 08/06/20 1422      Knowledge Questionnaire Score   Pre Score 22/26 Education Focus: Depression, Angina, and Nutrition           Core Components/Risk Factors/Patient Goals at Admission:  Personal Goals and Risk Factors at Admission - 08/06/20 1423      Core Components/Risk Factors/Patient Goals on Admission    Weight Management  Yes;Weight Loss;Obesity    Intervention Weight Management: Develop a combined nutrition and exercise program designed to reach desired caloric intake, while maintaining appropriate intake of nutrient and fiber, sodium and fats, and appropriate energy expenditure required for the weight goal.;Weight Management: Provide education and appropriate resources to help participant work on and attain dietary goals.;Weight Management/Obesity: Establish reasonable short term and long term weight goals.;Obesity: Provide education and appropriate resources to help participant work on and attain dietary goals.    Admit Weight 240 lb (108.9 kg)    Goal Weight: Short Term 235 lb (106.6 kg)    Goal Weight: Long Term 230 lb (104.3 kg)    Expected Outcomes Short Term: Continue to assess and modify interventions until short term weight is achieved;Long Term: Adherence to nutrition and physical activity/exercise program aimed toward attainment of established weight goal;Weight Loss: Understanding of general recommendations for a balanced deficit meal plan, which promotes 1-2 lb weight loss per week and includes a negative energy balance of [phone] kcal/d;Understanding recommendations for meals to include 15-35% energy as protein, 25-35% energy from fat, 35-60% energy from carbohydrates, less than 200mg  of dietary cholesterol, 20-35 gm of total fiber daily;Understanding of distribution of calorie intake throughout the day with the consumption of 4-5 meals/snacks    Heart Failure Yes    Intervention Provide a combined exercise and nutrition program that is supplemented with education, support and counseling about heart failure. Directed toward relieving symptoms such as shortness of breath, decreased exercise tolerance, and extremity edema.    Expected Outcomes Improve functional capacity of life;Short term: Attendance in program 2-3 days a week with increased exercise capacity. Reported lower sodium intake. Reported increased  fruit and vegetable intake. Reports medication compliance.;Short term: Daily weights obtained and reported for increase. Utilizing diuretic protocols set by physician.;Long term: Adoption of self-care skills and reduction of barriers for early signs and symptoms recognition and intervention leading to self-care maintenance.    Hypertension Yes    Intervention Monitor prescription use compliance.;Provide education on lifestyle modifcations including regular physical activity/exercise, weight management, moderate sodium restriction and increased consumption of fresh fruit, vegetables, and low fat dairy, alcohol moderation, and smoking cessation.    Expected Outcomes Short Term: Continued assessment and intervention until BP is < 140/38mm HG in hypertensive participants. < 130/85mm HG in hypertensive participants with diabetes, heart failure or chronic kidney disease.;Long Term: Maintenance of blood pressure at goal levels.    Lipids Yes    Intervention Provide education and support for participant on nutrition & aerobic/resistive exercise along with prescribed medications to achieve LDL 70mg , HDL >40mg .    Expected Outcomes Short Term: Participant states understanding of desired cholesterol values and is compliant with medications prescribed. Participant is following exercise prescription and nutrition guidelines.;Long Term: Cholesterol controlled with medications as prescribed, with individualized exercise RX and with personalized nutrition plan. Value goals: LDL < 70mg , HDL > 40 mg.  Education:Diabetes - Individual verbal and written instruction to review signs/symptoms of diabetes, desired ranges of glucose level fasting, after meals and with exercise. Acknowledge that pre and post exercise glucose checks will be done for 3 sessions at entry of program.   Core Components/Risk Factors/Patient Goals Review:   Goals and Risk Factor Review    Row Name 08/27/20 0746             Core  Components/Risk Factors/Patient Goals Review   Personal Goals Review Weight Management/Obesity;Heart Failure;Lipids       Review Kanon is weighing herself daily.  She is hypotensive (BP 102/62 today)  She has a nurse who helps her to manage her fluid levels.  She is taking all medications as directed.       Expected Outcomes Short: continue to monitor weight and BPLong: manage risk factors long term              Core Components/Risk Factors/Patient Goals at Discharge (Final Review):   Goals and Risk Factor Review - 08/27/20 0746      Core Components/Risk Factors/Patient Goals Review   Personal Goals Review Weight Management/Obesity;Heart Failure;Lipids    Review Giavanna is weighing herself daily.  She is hypotensive (BP 102/62 today)  She has a nurse who helps her to manage her fluid levels.  She is taking all medications as directed.    Expected Outcomes Short: continue to monitor weight and BPLong: manage risk factors long term           ITP Comments:  ITP Comments    Row Name 07/26/20 0906 08/06/20 1408 08/08/20 0728 08/14/20 0838 08/15/20 0600   ITP Comments Virtual Visit completed. Patient informed on EP and RD appointment and 6 Minute walk test. Patient also informed of patient health questionnaires on My Chart. Patient Verbalizes understanding. Visit diagnosis can be found in [facility] 07/23/2020. Completed 6MWT and gym orientation. Initial ITP created and sent for review to Dr. Emily Filbert, Medical Director. First full day of exercise!  Patient was oriented to gym and equipment including functions, settings, policies, and procedures.  Patient's individual exercise prescription and treatment plan were reviewed.  All starting workloads were established based on the results of the 6 minute walk test done at initial orientation visit.  The plan for exercise progression was also introduced and progression will be customized based on patient's performance and goals. Reviewing pt chart, noted to  recieve shock from ICD on 2/21 at 120 am.  Call into doctor and change in medication therapy. 30 Day review completed. Medical Director ITP review done, changes made as directed, and signed approval by Medical Director.   [place] Name 08/17/20 0848 09/12/20 0704         ITP Comments Cleared to return to rehab.  On new medications.  Will return on Monday 08/21/19 30 Day review completed. Medical Director ITP review done, changes made as directed, and signed approval by Medical Director.             Comments:

## 2020-09-12 NOTE — Progress Notes (Signed)
Daily Session Note  Patient Details  Name: [name] MRN: [id number] Date of Birth: 10-Feb-1971 Referring Provider:   Flowsheet Row Cardiac Rehab from 08/06/2020 in [facility] Cardiac and Pulmonary Rehab  Referring Provider [name] MD      Encounter Date: 09/12/2020  Check In:  Session Check In - 09/12/20 0730      Check-In   Supervising physician immediately available to respond to emergencies See telemetry face sheet for immediately available ER MD    Location [facility]-Cardiac & Pulmonary Rehab    Staff Present Birdie Sons, MPA, Elveria Rising, BA, ACSM CEP, Exercise Physiologist;Joseph Tessie Fass RCP,RRT,BSRT    Virtual Visit No    Medication changes reported     No    Fall or balance concerns reported    No    Warm-up and Cool-down Performed on first and last piece of equipment    Resistance Training Performed Yes    VAD Patient? No    PAD/SET Patient? No      Pain Assessment   Currently in Pain? No/denies              Social History   Tobacco Use  Smoking Status Never Smoker  Smokeless Tobacco Never Used    Goals Met:  Independence with exercise equipment Exercise tolerated well No report of cardiac concerns or symptoms Strength training completed today  Goals Unmet:  Not Applicable  Comments: Pt able to follow exercise prescription today without complaint.  Will continue to monitor for progression.    Dr. Emily Filbert is Medical Director for [place] and [facility].

## 2020-09-13 ENCOUNTER — Telehealth: Payer: Self-pay | Admitting: Internal Medicine

## 2020-09-13 NOTE — Telephone Encounter (Signed)
Deboraha Sprang, MD  09/12/2020 5:29 PM EDT      Please Inform Patient  Labs are normal x chronic anemia  __ has that ever been evaluated by her PCP   Thanks

## 2020-09-13 NOTE — Telephone Encounter (Signed)
I spoke with the patient regarding her CBC done on 08/30/20. The patient advised she was not currently following with a PCP.  I have advised her to please try to establish with one, but otherwise, we will see her as planned on 09/25/20 with Dr. [name].    The patient voices understanding and is agreeable.

## 2020-09-14 ENCOUNTER — Other Ambulatory Visit: Payer: Self-pay

## 2020-09-14 ENCOUNTER — Encounter: Payer: BC Managed Care – PPO | Admitting: *Deleted

## 2020-09-14 DIAGNOSIS — I5022 Chronic systolic (congestive) heart failure: Secondary | ICD-10-CM

## 2020-09-14 NOTE — Progress Notes (Signed)
Daily Session Note  Patient Details  Name: MODESTA SAMMONS MRN: [id number] Date of Birth: 1971-03-26 Referring Provider:   Flowsheet Row Cardiac Rehab from 08/06/2020 in [facility] Cardiac and Pulmonary Rehab  Referring Provider [name] MD      Encounter Date: 09/14/2020  Check In:  Session Check In - 09/14/20 0805      Check-In   Supervising physician immediately available to respond to emergencies See telemetry face sheet for immediately available ER MD    Location [facility]-Cardiac & Pulmonary Rehab    Staff Present Heath Lark, RN, BSN, CCRP;Joseph Hood RCP,RRT,BSRT;Jessica Wiseman, [place], [place], [place], CCET    Virtual Visit No    Medication changes reported     No    Fall or balance concerns reported    No    Warm-up and Cool-down Performed on first and last piece of equipment    Resistance Training Performed Yes    VAD Patient? No    PAD/SET Patient? No      Pain Assessment   Currently in Pain? No/denies              Social History   Tobacco Use  Smoking Status Never Smoker  Smokeless Tobacco Never Used    Goals Met:  Independence with exercise equipment Exercise tolerated well No report of cardiac concerns or symptoms  Goals Unmet:  Not Applicable  Comments: Pt able to follow exercise prescription today without complaint.  Will continue to monitor for progression.    Dr. Emily Filbert is Medical Director for [place] and [facility].

## 2020-09-17 ENCOUNTER — Encounter: Payer: BC Managed Care – PPO | Admitting: *Deleted

## 2020-09-17 ENCOUNTER — Ambulatory Visit (INDEPENDENT_AMBULATORY_CARE_PROVIDER_SITE_OTHER): Payer: BC Managed Care – PPO

## 2020-09-17 ENCOUNTER — Other Ambulatory Visit: Payer: Self-pay

## 2020-09-17 DIAGNOSIS — I428 Other cardiomyopathies: Secondary | ICD-10-CM

## 2020-09-17 DIAGNOSIS — I5022 Chronic systolic (congestive) heart failure: Secondary | ICD-10-CM | POA: Diagnosis not present

## 2020-09-17 NOTE — Progress Notes (Signed)
Daily Session Note  Patient Details  Name: AURIANA SCALIA MRN: [id number] Date of Birth: Jun 04, 1971 Referring Provider:   Flowsheet Row Cardiac Rehab from 08/06/2020 in [facility] Cardiac and Pulmonary Rehab  Referring Provider [name] MD      Encounter Date: 09/17/2020  Check In:  Session Check In - 09/17/20 0837      Check-In   Supervising physician immediately available to respond to emergencies See telemetry face sheet for immediately available ER MD    Location [facility]-Cardiac & Pulmonary Rehab    Staff Present Heath Lark, RN, BSN, CCRP;Joseph Hood RCP,RRT,BSRT;Kelly [place], [place], ACSM CEP, Exercise Physiologist    Virtual Visit No    Medication changes reported     No    Fall or balance concerns reported    No    Warm-up and Cool-down Performed on first and last piece of equipment    VAD Patient? No    PAD/SET Patient? No      Pain Assessment   Currently in Pain? No/denies              Social History   Tobacco Use  Smoking Status Never Smoker  Smokeless Tobacco Never Used    Goals Met:  Independence with exercise equipment Exercise tolerated well No report of cardiac concerns or symptoms  Goals Unmet:  Not Applicable  Comments: Pt able to follow exercise prescription today without complaint.  Will continue to monitor for progression.    Dr. Emily Filbert is Medical Director for [place] and [facility].

## 2020-09-18 LAB — CUP PACEART REMOTE DEVICE CHECK
Battery Remaining Longevity: 36 mo
Battery Voltage: 2.96 V
Brady Statistic RV Percent Paced: 0.01 %
Date Time Interrogation Session: 20220329104208
HighPow Impedance: 72 Ohm
Implantable Lead Implant Date: 20150706
Implantable Lead Location: 753860
Implantable Pulse Generator Implant Date: 20150706
Lead Channel Impedance Value: 570 Ohm
Lead Channel Impedance Value: 703 Ohm
Lead Channel Pacing Threshold Amplitude: 0.5 V
Lead Channel Pacing Threshold Pulse Width: 0.4 ms
Lead Channel Sensing Intrinsic Amplitude: 8.75 mV
Lead Channel Sensing Intrinsic Amplitude: 8.75 mV
Lead Channel Setting Pacing Amplitude: 2 V
Lead Channel Setting Pacing Pulse Width: 0.4 ms
Lead Channel Setting Sensing Sensitivity: 0.3 mV

## 2020-09-19 ENCOUNTER — Other Ambulatory Visit: Payer: Self-pay

## 2020-09-19 DIAGNOSIS — I5022 Chronic systolic (congestive) heart failure: Secondary | ICD-10-CM | POA: Diagnosis not present

## 2020-09-19 DIAGNOSIS — G4733 Obstructive sleep apnea (adult) (pediatric): Secondary | ICD-10-CM | POA: Diagnosis not present

## 2020-09-19 NOTE — Progress Notes (Signed)
Daily Session Note  Patient Details  Name: [name] MRN: [id number] Date of Birth: [birth year] Referring Provider:   Flowsheet Row Cardiac Rehab from 08/06/2020 in [facility] Cardiac and Pulmonary Rehab  Referring Provider [name] MD      Encounter Date: 09/19/2020  Check In:  Session Check In - 09/19/20 0732      Check-In   Supervising physician immediately available to respond to emergencies See telemetry face sheet for immediately available ER MD    Location [facility]-Cardiac & Pulmonary Rehab    Staff Present Birdie Sons, MPA, RN;Melissa Caiola RDN, Rowe Pavy, BA, ACSM CEP, Exercise Physiologist    Virtual Visit No    Medication changes reported     No    Fall or balance concerns reported    No    Warm-up and Cool-down Performed on first and last piece of equipment    Resistance Training Performed Yes    VAD Patient? No    PAD/SET Patient? No      Pain Assessment   Currently in Pain? No/denies              Social History   Tobacco Use  Smoking Status Never Smoker  Smokeless Tobacco Never Used    Goals Met:  Independence with exercise equipment Exercise tolerated well No report of cardiac concerns or symptoms Strength training completed today  Goals Unmet:  Not Applicable  Comments: Pt able to follow exercise prescription today without complaint.  Will continue to monitor for progression.    Dr. Emily Filbert is Medical Director for [place] and [facility].

## 2020-09-21 ENCOUNTER — Encounter: Payer: BC Managed Care – PPO | Attending: Cardiology | Admitting: *Deleted

## 2020-09-21 ENCOUNTER — Other Ambulatory Visit: Payer: Self-pay

## 2020-09-21 DIAGNOSIS — I5022 Chronic systolic (congestive) heart failure: Secondary | ICD-10-CM | POA: Diagnosis not present

## 2020-09-21 NOTE — Progress Notes (Signed)
Daily Session Note  Patient Details  Name: [name] MRN: [id number] Date of Birth: 13-Oct-1970 Referring Provider:   Flowsheet Row Cardiac Rehab from 08/06/2020 in [facility] Cardiac and Pulmonary Rehab  Referring Provider [name] MD      Encounter Date: 09/21/2020  Check In:  Session Check In - 09/21/20 0913      Check-In   Supervising physician immediately available to respond to emergencies See telemetry face sheet for immediately available ER MD    Location [facility]-Cardiac & Pulmonary Rehab    Staff Present Heath Lark, RN, BSN, CCRP;Joseph Hood RCP,RRT,BSRT;Jessica [place], [place], [place], [place], CCET    Virtual Visit No    Medication changes reported     No    Fall or balance concerns reported    No    Warm-up and Cool-down Performed on first and last piece of equipment    Resistance Training Performed Yes    VAD Patient? No    PAD/SET Patient? No      Pain Assessment   Currently in Pain? No/denies              Social History   Tobacco Use  Smoking Status Never Smoker  Smokeless Tobacco Never Used    Goals Met:  Proper associated with RPD/PD & O2 Sat Independence with exercise equipment Exercise tolerated well No report of cardiac concerns or symptoms  Goals Unmet:  Not Applicable  Comments: Pt able to follow exercise prescription today without complaint.  Will continue to monitor for progression.    Dr. Emily Filbert is Medical Director for [place] and [facility].

## 2020-09-24 ENCOUNTER — Other Ambulatory Visit (HOSPITAL_COMMUNITY): Payer: Self-pay | Admitting: Cardiology

## 2020-09-25 ENCOUNTER — Encounter: Payer: Self-pay | Admitting: Internal Medicine

## 2020-09-25 ENCOUNTER — Ambulatory Visit (INDEPENDENT_AMBULATORY_CARE_PROVIDER_SITE_OTHER): Payer: BC Managed Care – PPO | Admitting: Internal Medicine

## 2020-09-25 ENCOUNTER — Other Ambulatory Visit: Payer: Self-pay

## 2020-09-25 VITALS — BP 96/72 | HR 76 | Ht 63.0 in | Wt 238.0 lb

## 2020-09-25 DIAGNOSIS — I472 Ventricular tachycardia, unspecified: Secondary | ICD-10-CM

## 2020-09-25 DIAGNOSIS — Z79899 Other long term (current) drug therapy: Secondary | ICD-10-CM

## 2020-09-25 DIAGNOSIS — Z95810 Presence of automatic (implantable) cardiac defibrillator: Secondary | ICD-10-CM

## 2020-09-25 DIAGNOSIS — I428 Other cardiomyopathies: Secondary | ICD-10-CM

## 2020-09-25 DIAGNOSIS — I5022 Chronic systolic (congestive) heart failure: Secondary | ICD-10-CM

## 2020-09-25 DIAGNOSIS — I63312 Cerebral infarction due to thrombosis of left middle cerebral artery: Secondary | ICD-10-CM

## 2020-09-25 LAB — PACEMAKER DEVICE OBSERVATION

## 2020-09-25 MED ORDER — CARVEDILOL 3.125 MG PO TABS: 3.1250 mg | ORAL_TABLET | Freq: Two times a day (BID) | ORAL | 1 refills | Status: DC

## 2020-09-25 NOTE — Patient Instructions (Addendum)
Medication Instructions:  - Your physician has recommended you make the following change in your medication:   1) DECREASE coreg (carvediolol) to 3.125 mg- take 1 tablet by mouth TWICE daily   2) DECREASE spironolactone 25 mg- take 0.5 tablet (12.5 mg) by mouth once daily  *If you need a refill on your cardiac medications before your next appointment, please call your pharmacy*   Lab Work: - Your physician recommends that you have lab work today: CBC   If you have labs (blood work) drawn today and your tests are completely normal, you will receive your results only by: Marland Kitchen MyChart Message (if you have MyChart) OR . A paper copy in the mail If you have any lab test that is abnormal or we need to change your treatment, we will call you to review the results.   Testing/Procedures: - none ordered   Follow-Up: At [facility], you and your health needs are our priority.  As part of our continuing mission to provide you with exceptional heart care, we have created designated Provider Care Teams.  These Care Teams include your primary Cardiologist (physician) and Advanced Practice Providers (APPs -  Physician Assistants and Nurse Practitioners) who all work together to provide you with the care you need, when you need it.  We recommend signing up for the patient portal called "MyChart".  Sign up information is provided on this After Visit Summary.  MyChart is used to connect with patients for Virtual Visits (Telemedicine).  Patients are able to view lab/test results, encounter notes, upcoming appointments, etc.  Non-urgent messages can be sent to your provider as well.   To learn more about what you can do with MyChart, go to [email or web address].    Your next appointment:   3 month(s)  The format for your next appointment:   In Person  Provider:   Virl Axe, MD   Other Instructions - call/ send a MyChart message next week to let Dr. [name] know how you are feeling with the  medication changes made today  - if no better, then he will plan to stop entresto and replace this with an alternative medication (losartan)

## 2020-09-25 NOTE — Progress Notes (Signed)
Patient Care Team: Patient, No Pcp Per (Inactive) as PCP - General (General Practice) Larey Dresser, MD as PCP - Cardiology (Cardiology) Deboraha Sprang, MD as PCP - Electrophysiology (Cardiology) Sueanne Margarita, MD as PCP - Sleep Medicine (Cardiology)   HPI  [name] is a 50 y.o. female seen in follow-up for  ICD implanted  With generator replacement 2015 and replacing [phone] also with a history of frequent ventricular ectopy and VT Rx polymorphic and monomorphic on amiodarone and adjunctive mexiletine  Has had recurrent shocks in the setting of hypokalemia most recently 8/21 Again with VT therapy 08/12/20 at which time potassium was presumed normal, 2/15--3.9  &  3/1--4.1  Reviewed with colleagues, not sure about the benefits of ablation, quinidine recommended is a thought for adjunctive medications as opposed to mexiletine.  Quinidine began 3/22  The patient denies chest pain, shortness of breath, nocturnal dyspnea, orthopnea or peripheral edema.  There have been no palpitations   Significant LH and fatigue, aggravated by standing and showers  BP at home 90s .   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  Question cardioembolic.    Underwent stenting.  On DAPT.   Records and Results Reviewed   Past Medical History:  Diagnosis Date  . Automatic implantable cardioverter-defibrillator in situ   . Chronic CHF (congestive heart failure) (HCC)    a. EF 15-20% b. RHC (09/2013) RA 14, RV 57/22, PA 64/36 (48), PCWP 18, FIck CO/CI 3.7/1.6, PVR 8.1 WU, PA sat  47%   . History of stomach ulcers   . Hypertension   . Hypotension   . Hypothyroidism   . Morbid obesity ([place])   . Myocardial infarction ([place]) 08/2013  . Nocturnal dyspnea   . Nonischemic cardiomyopathy ([place])   . Sinus tachycardia   . Sleep apnea   . Snoring-prob OSA 09/04/2011  . Sprint Fidelis ICD lead RECALL  [id number]   . UARS (upper airway resistance syndrome) 09/04/2011   HST 12/2013:  AHI 4/hr (numerous episodes of airflow reduction that did not have concomitant desaturation)     Past Surgical History:  Procedure Laterality Date  . BREATH TEK H PYLORI N/A 11/09/2014   Procedure: BREATH TEK H PYLORI;  Surgeon: Greer Pickerel, MD;  Location: [facility];  Service: General;  Laterality: N/A;  . CARDIAC CATHETERIZATION  ~ 2006; 09/2013  . CARDIAC CATHETERIZATION N/A 05/23/2016   Procedure: Right Heart Cath;  Surgeon: Larey Dresser, MD;  Location: [place] CV LAB;  Service: Cardiovascular;  Laterality: N/A;  . CARDIAC DEFIBRILLATOR PLACEMENT  2006; 12/26/2013   Medtronic [id number]; 12-2013 ICD gen change and RV lead revision with new 9417 RV lead by Dr [name]  . CESAREAN SECTION  1999  . IMPLANTABLE CARDIOVERTER DEFIBRILLATOR GENERATOR CHANGE N/A 12/26/2013   Procedure: IMPLANTABLE CARDIOVERTER DEFIBRILLATOR GENERATOR CHANGE;  Surgeon: Deboraha Sprang, MD;  Location: [facility] CATH LAB;  Service: Cardiovascular;  Laterality: N/A;  . IR CT HEAD LTD  04/17/2020  . IR CT HEAD LTD  04/17/2020  . IR INTRA CRAN STENT  04/17/2020  . IR PERCUTANEOUS ART THROMBECTOMY/INFUSION INTRACRANIAL INC DIAG ANGIO  04/17/2020  . IR RADIOLOGIST EVAL & MGMT  06/05/2020  . LEAD REVISION N/A 12/26/2013   Procedure: LEAD REVISION;  Surgeon: Deboraha Sprang, MD;  Location: [facility] CATH LAB;  Service: Cardiovascular;  Laterality: N/A;  . RADIOLOGY WITH ANESTHESIA N/A 04/17/2020   Procedure: Code Stroke;  Surgeon: Luanne Bras, MD;  Location: [place];  Service: Radiology;  Laterality: N/A;  . RIGHT HEART CATHETERIZATION N/A  02/15/2014   Procedure: RIGHT HEART CATH;  Surgeon: Larey Dresser, MD;  Location: [facility] CATH LAB;  Service: Cardiovascular;  Laterality: N/A;  . TUBAL LIGATION  1999    Current Meds  Medication Sig  . amiodarone (PACERONE) 200 MG tablet Take 1.5 tablets (300 mg) by mouth once daily  . aspirin 81 MG chewable tablet Chew 1 tablet (81 mg total) by mouth daily.  Marland Kitchen atorvastatin (LIPITOR) 40 MG tablet Take 40 mg by mouth daily.  . carvedilol (COREG) 6.25 MG tablet TAKE 1 TABLET BY MOUTH TWICE A DAY WITH A MEAL.  Marland Kitchen digoxin (LANOXIN) 0.125 MG tablet Take 1 tablet (0.125 mg) by mouth once every other day  . FARXIGA 10 MG TABS tablet TAKE 1 TABLET BY MOUTH ONCE A DAY BEFOREBREAKFAST  . levothyroxine (SYNTHROID) 25 MCG tablet Take 1 tablet (25 mcg total) by mouth daily before breakfast.  . metolazone (ZAROXOLYN) 2.5 MG tablet Take 1 tablet (2.5 mg total) by mouth as needed (extra fluid as directed by HF clinic).  . potassium chloride SA (KLOR-CON) 20 MEQ tablet Take 40 mEq by mouth 2 (two) times daily.  Marland Kitchen quiniDINE gluconate 324 MG CR tablet Take 1 tablet (324 mg total) by mouth 2 (two) times daily.  . sacubitril-valsartan (ENTRESTO) 24-26 MG Take 1 tablet by mouth 2 (two) times daily.  Marland Kitchen spironolactone (ALDACTONE) 25 MG tablet Take 25 mg by mouth daily.  . ticagrelor (BRILINTA) 90 MG TABS tablet Take 1 tablet (90 mg total) by mouth 2 (two) times daily.  Marland Kitchen torsemide (DEMADEX) 20 MG tablet Take 4 tablets (80 mg total) by mouth daily.    No Known Allergies    Review of Systems negative except from HPI and PMH  Physical Exam BP 96/72   Pulse 76   Ht 5\' 3"  (1.6 m)   Wt 238 lb (108 kg)   BMI 42.16 kg/m  Well developed and Morbidly obese in no acute distress HENT normal Neck supple with JVP-flat Clear Device pocket well healed; without hematoma or erythema.  There is no tethering  Regular rate and rhythm, no   murmur Abd-soft with active BS No Clubbing cyanosis  edema Skin-warm and dry A &  Oriented  Grossly normal sensory and motor function  ECG sinus @ 76 19/14/42 IVCD with Rs  Lead 1; qR lead avL and RS V6  CrCl cannot be calculated (Patient's most recent lab result is older than the maximum 21 days allowed.).   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  Have reached out regarding surveillance schedule for agranulocytosis.  We will check CBC and differential today Euvolemic continue current meds-- going to rehab  With hypotension, will decrease aldactone 25>>12.5; carvedilol 6.25 >3.125 If BP does not improve or symptoms persist will stop entresto >> losartan 25mg   Shower at night  Continue rehab     Current medicines are reviewed at length with the patient today .  The patient does not  have concerns regarding medicines.

## 2020-09-26 DIAGNOSIS — I5022 Chronic systolic (congestive) heart failure: Secondary | ICD-10-CM | POA: Diagnosis not present

## 2020-09-26 LAB — CBC WITH DIFFERENTIAL/PLATELET
Basophils Absolute: 0 10*3/uL (ref 0.0–0.2)
Basos: 0 %
EOS (ABSOLUTE): 0 10*3/uL (ref 0.0–0.4)
Eos: 0 %
Hematocrit: 34.8 % (ref 34.0–46.6)
Hemoglobin: 10.8 g/dL — ABNORMAL LOW (ref 11.1–15.9)
Immature Grans (Abs): 0 10*3/uL (ref 0.0–0.1)
Immature Granulocytes: 1 %
Lymphocytes Absolute: 1.2 10*3/uL (ref 0.7–3.1)
Lymphs: 17 %
MCH: 23.6 pg — ABNORMAL LOW (ref 26.6–33.0)
MCHC: 31 g/dL — ABNORMAL LOW (ref 31.5–35.7)
MCV: 76 fL — ABNORMAL LOW (ref 79–97)
Monocytes Absolute: 0.8 10*3/uL (ref 0.1–0.9)
Monocytes: 12 %
Neutrophils Absolute: 4.9 10*3/uL (ref 1.4–7.0)
Neutrophils: 70 %
Platelets: 383 10*3/uL (ref 150–450)
RBC: 4.58 x10E6/uL (ref 3.77–5.28)
RDW: 15.9 % — ABNORMAL HIGH (ref 11.7–15.4)
WBC: 6.9 10*3/uL (ref 3.4–10.8)

## 2020-09-26 NOTE — Progress Notes (Signed)
Remote ICD transmission.   

## 2020-09-26 NOTE — Progress Notes (Signed)
Daily Session Note  Patient Details  Name: [name] MRN: [id number] Date of Birth: 02-17-1971 Referring Provider:   Flowsheet Row Cardiac Rehab from 08/06/2020 in [facility] Cardiac and Pulmonary Rehab  Referring Provider [name] MD      Encounter Date: 09/26/2020  Check In:  Session Check In - 09/26/20 0719      Check-In   Supervising physician immediately available to respond to emergencies See telemetry face sheet for immediately available ER MD    Location [facility]-Cardiac & Pulmonary Rehab    Staff Present Birdie Sons, MPA, RN;Amanda Oletta Darter, BA, ACSM CEP, Exercise Physiologist;Joseph Tessie Fass RCP,RRT,BSRT    Virtual Visit No    Medication changes reported     Yes    Comments decreased dose of carvedilol and spironalactone    Fall or balance concerns reported    No    Warm-up and Cool-down Performed on first and last piece of equipment    Resistance Training Performed Yes    VAD Patient? No    PAD/SET Patient? No      Pain Assessment   Currently in Pain? No/denies              Social History   Tobacco Use  Smoking Status Never Smoker  Smokeless Tobacco Never Used    Goals Met:  Independence with exercise equipment Exercise tolerated well No report of cardiac concerns or symptoms Strength training completed today  Goals Unmet:  Not Applicable  Comments: Pt able to follow exercise prescription today without complaint.  Will continue to monitor for progression.    Dr. Emily Filbert is Medical Director for [place] and [facility].

## 2020-09-28 ENCOUNTER — Other Ambulatory Visit: Payer: Self-pay

## 2020-09-28 ENCOUNTER — Encounter: Payer: BC Managed Care – PPO | Admitting: *Deleted

## 2020-09-28 DIAGNOSIS — I5022 Chronic systolic (congestive) heart failure: Secondary | ICD-10-CM | POA: Diagnosis not present

## 2020-09-28 NOTE — Progress Notes (Signed)
No ICM remote transmission received for 09/24/2020 and next ICM transmission scheduled for 10/22/2020.

## 2020-09-28 NOTE — Progress Notes (Signed)
Daily Session Note  Patient Details  Name: [name] MRN: [id number] Date of Birth: 02-22-1971 Referring Provider:   Flowsheet Row Cardiac Rehab from 08/06/2020 in [facility] Cardiac and Pulmonary Rehab  Referring Provider [name] MD      Encounter Date: 09/28/2020  Check In:  Session Check In - 09/28/20 0807      Check-In   Supervising physician immediately available to respond to emergencies See telemetry face sheet for immediately available ER MD    Location [facility]-Cardiac & Pulmonary Rehab    Staff Present Heath Lark, RN, BSN, CCRP;Jessica Trumann, MA, RCEP, CCRP, CCET;Joseph Lusk RCP,RRT,BSRT    Virtual Visit No    Medication changes reported     No    Fall or balance concerns reported    No    Warm-up and Cool-down Performed on first and last piece of equipment    Resistance Training Performed Yes    VAD Patient? No    PAD/SET Patient? No      Pain Assessment   Currently in Pain? No/denies              Social History   Tobacco Use  Smoking Status Never Smoker  Smokeless Tobacco Never Used    Goals Met:  Independence with exercise equipment Exercise tolerated well No report of cardiac concerns or symptoms  Goals Unmet:  Not Applicable  Comments: Pt able to follow exercise prescription today without complaint.  Will continue to monitor for progression.    Dr. Emily Filbert is Medical Director for [place] and [facility].

## 2020-10-01 ENCOUNTER — Encounter: Payer: BC Managed Care – PPO | Admitting: *Deleted

## 2020-10-01 ENCOUNTER — Other Ambulatory Visit: Payer: Self-pay

## 2020-10-01 DIAGNOSIS — I5022 Chronic systolic (congestive) heart failure: Secondary | ICD-10-CM

## 2020-10-01 NOTE — Progress Notes (Signed)
Daily Session Note  Patient Details  Name: LATORIE MONTESANO MRN: [id number] Date of Birth: [birth year] Referring Provider:   Flowsheet Row Cardiac Rehab from 08/06/2020 in [facility] Cardiac and Pulmonary Rehab  Referring Provider [name] MD      Encounter Date: 10/01/2020  Check In:  Session Check In - 10/01/20 0812      Check-In   Supervising physician immediately available to respond to emergencies See telemetry face sheet for immediately available ER MD    Location [facility]-Cardiac & Pulmonary Rehab    Staff Present Heath Lark, RN, BSN, Laveda Norman, BS, ACSM CEP, Exercise Physiologist;Joseph Tessie Fass RCP,RRT,BSRT    Virtual Visit No    Medication changes reported     No    Fall or balance concerns reported    No    Warm-up and Cool-down Performed on first and last piece of equipment    Resistance Training Performed Yes    VAD Patient? No    PAD/SET Patient? No      Pain Assessment   Currently in Pain? No/denies              Social History   Tobacco Use  Smoking Status Never Smoker  Smokeless Tobacco Never Used    Goals Met:  Independence with exercise equipment Exercise tolerated well No report of cardiac concerns or symptoms  Goals Unmet:  Not Applicable  Comments: Pt able to follow exercise prescription today without complaint.  Will continue to monitor for progression.    Dr. Emily Filbert is Medical Director for [place] and [facility].

## 2020-10-03 ENCOUNTER — Other Ambulatory Visit: Payer: Self-pay

## 2020-10-03 DIAGNOSIS — I5022 Chronic systolic (congestive) heart failure: Secondary | ICD-10-CM | POA: Diagnosis not present

## 2020-10-03 NOTE — Progress Notes (Signed)
Daily Session Note  Patient Details  Name: [name] MRN: [id number] Date of Birth: Apr 05, 1971 Referring Provider:   Flowsheet Row Cardiac Rehab from 08/06/2020 in [facility] Cardiac and Pulmonary Rehab  Referring Provider [name] MD      Encounter Date: 10/03/2020  Check In:  Session Check In - 10/03/20 0720      Check-In   Supervising physician immediately available to respond to emergencies See telemetry face sheet for immediately available ER MD    Location [facility]-Cardiac & Pulmonary Rehab    Staff Present Birdie Sons, MPA, Elveria Rising, BA, ACSM CEP, Exercise Physiologist;Joseph Tessie Fass RCP,RRT,BSRT    Virtual Visit No    Medication changes reported     No    Fall or balance concerns reported    No    Warm-up and Cool-down Performed on first and last piece of equipment    Resistance Training Performed Yes    VAD Patient? No    PAD/SET Patient? No      Pain Assessment   Currently in Pain? No/denies              Social History   Tobacco Use  Smoking Status Never Smoker  Smokeless Tobacco Never Used    Goals Met:  Independence with exercise equipment Exercise tolerated well No report of cardiac concerns or symptoms Strength training completed today  Goals Unmet:  Not Applicable  Comments: Pt able to follow exercise prescription today without complaint.  Will continue to monitor for progression.    Dr. Emily Filbert is Medical Director for [place] and [facility].

## 2020-10-05 ENCOUNTER — Other Ambulatory Visit: Payer: Self-pay

## 2020-10-05 ENCOUNTER — Encounter: Payer: BC Managed Care – PPO | Admitting: *Deleted

## 2020-10-05 DIAGNOSIS — I5022 Chronic systolic (congestive) heart failure: Secondary | ICD-10-CM | POA: Diagnosis not present

## 2020-10-05 NOTE — Progress Notes (Signed)
Daily Session Note  Patient Details  Name: [name] MRN: [id number] Date of Birth: May 08, 1971 Referring Provider:   Flowsheet Row Cardiac Rehab from 08/06/2020 in [facility] Cardiac and Pulmonary Rehab  Referring Provider [name] MD      Encounter Date: 10/05/2020  Check In:  Session Check In - 10/05/20 0830      Check-In   Supervising physician immediately available to respond to emergencies See telemetry face sheet for immediately available ER MD    Location [facility]-Cardiac & Pulmonary Rehab    Staff Present Heath Lark, RN, BSN, CCRP;Joseph Hood RCP,RRT,BSRT;Melissa [place] RDN, LDN    Virtual Visit No    Medication changes reported     No    Fall or balance concerns reported    No    Warm-up and Cool-down Performed on first and last piece of equipment    Resistance Training Performed Yes    VAD Patient? No    PAD/SET Patient? No      Pain Assessment   Currently in Pain? No/denies              Social History   Tobacco Use  Smoking Status Never Smoker  Smokeless Tobacco Never Used    Goals Met:  Independence with exercise equipment Exercise tolerated well No report of cardiac concerns or symptoms  Goals Unmet:  Not Applicable  Comments: Pt able to follow exercise prescription today without complaint.  Will continue to monitor for progression.    Dr. Emily Filbert is Medical Director for [place] and [facility].

## 2020-10-08 ENCOUNTER — Other Ambulatory Visit: Payer: Self-pay

## 2020-10-08 ENCOUNTER — Encounter: Payer: BC Managed Care – PPO | Admitting: *Deleted

## 2020-10-08 VITALS — Ht 64.1 in | Wt 242.4 lb

## 2020-10-08 DIAGNOSIS — I5022 Chronic systolic (congestive) heart failure: Secondary | ICD-10-CM

## 2020-10-08 NOTE — Progress Notes (Signed)
Daily Session Note  Patient Details  Name: [name] MRN: [id number] Date of Birth: 1970-08-18 Referring Provider:   Flowsheet Row Cardiac Rehab from 08/06/2020 in [facility] Cardiac and Pulmonary Rehab  Referring Provider [name] MD      Encounter Date: 10/08/2020  Check In:  Session Check In - 10/08/20 0833      Check-In   Supervising physician immediately available to respond to emergencies See telemetry face sheet for immediately available ER MD    Location [facility]-Cardiac & Pulmonary Rehab    Staff Present Heath Lark, RN, BSN, CCRP;Joseph Hood RCP,RRT,BSRT;Kelly [place], [place], ACSM CEP, Exercise Physiologist    Virtual Visit No    Medication changes reported     No    Fall or balance concerns reported    No    Warm-up and Cool-down Performed on first and last piece of equipment    Resistance Training Performed Yes    VAD Patient? No    PAD/SET Patient? No      Pain Assessment   Currently in Pain? No/denies             6 Minute Walk    Row Name 08/06/20 1408 10/08/20 0743       6 Minute Walk   Phase Initial Discharge    Distance 1240 feet 1470 feet    Distance % Change -- 19 %    Distance Feet Change -- 230 ft    Walk Time 6 minutes 6 minutes    # of Rest Breaks 0 0    MPH 2.35 2.78    METS 3.58 3.67    RPE 11 13    Perceived Dyspnea  1 --    VO2 Peak 12.52 12.83    Symptoms Yes (comment) No    Comments hand tingling 2-3/10 (since CVA), SOB --    Resting HR 99 bpm 77 bpm    Resting BP 108/66 110/60    Resting Oxygen Saturation  100 % --    Exercise Oxygen Saturation  during 6 min walk 98 % --    Max Ex. HR 128 bpm 104 bpm    Max Ex. BP 146/74 142/82    2 Minute Post BP 110/60 120/60              Social History   Tobacco Use  Smoking Status Never Smoker  Smokeless Tobacco Never Used    Goals Met:  Independence with exercise equipment Exercise tolerated well No report of cardiac concerns or symptoms  Goals Unmet:  Not  Applicable  Comments: Pt able to follow exercise prescription today without complaint.  Will continue to monitor for progression.    Dr. Emily Filbert is Medical Director for [place] and [facility].

## 2020-10-10 ENCOUNTER — Encounter: Payer: Self-pay | Admitting: *Deleted

## 2020-10-10 ENCOUNTER — Other Ambulatory Visit: Payer: Self-pay

## 2020-10-10 ENCOUNTER — Encounter: Payer: BC Managed Care – PPO | Admitting: *Deleted

## 2020-10-10 DIAGNOSIS — I5022 Chronic systolic (congestive) heart failure: Secondary | ICD-10-CM | POA: Diagnosis not present

## 2020-10-10 NOTE — Progress Notes (Signed)
Cardiac Individual Treatment Plan  Patient Details  Name: CHARLEAN CARNEAL MRN: [id number] Date of Birth: Feb 10, 1971 Referring Provider:   Flowsheet Row Cardiac Rehab from 08/06/2020 in [facility] Cardiac and Pulmonary Rehab  Referring Provider [name] MD      Initial Encounter Date:  Flowsheet Row Cardiac Rehab from 08/06/2020 in [facility] Cardiac and Pulmonary Rehab  Date 08/06/20      Visit Diagnosis: Heart failure, chronic systolic (HCC)  Patient's Home Medications on Admission:  Current Outpatient Medications:  .  amiodarone (PACERONE) 200 MG tablet, Take 1.5 tablets (300 mg) by mouth once daily, Disp: , Rfl:  .  aspirin 81 MG chewable tablet, Chew 1 tablet (81 mg total) by mouth daily., Disp: 30 tablet, Rfl: 11 .  atorvastatin (LIPITOR) 40 MG tablet, Take 40 mg by mouth daily., Disp: , Rfl:  .  carvedilol (COREG) 3.125 MG tablet, Take 1 tablet (3.125 mg total) by mouth 2 (two) times daily with a meal., Disp: 180 tablet, Rfl: 1 .  digoxin (LANOXIN) 0.125 MG tablet, Take 1 tablet (0.125 mg) by mouth once every other day, Disp: , Rfl:  .  FARXIGA 10 MG TABS tablet, TAKE 1 TABLET BY MOUTH ONCE A DAY BEFOREBREAKFAST, Disp: 30 tablet, Rfl: 5 .  levothyroxine (SYNTHROID) 25 MCG tablet, Take 1 tablet (25 mcg total) by mouth daily before breakfast., Disp: 90 tablet, Rfl: 3 .  metolazone (ZAROXOLYN) 2.5 MG tablet, Take 1 tablet (2.5 mg total) by mouth as needed (extra fluid as directed by HF clinic)., Disp: 5 tablet, Rfl: 3 .  potassium chloride SA (KLOR-CON) 20 MEQ tablet, Take 40 mEq by mouth 2 (two) times daily., Disp: , Rfl:  .  quiniDINE gluconate 324 MG CR tablet, Take 1 tablet (324 mg total) by mouth 2 (two) times daily., Disp: 60 tablet, Rfl: 6 .  sacubitril-valsartan (ENTRESTO) 24-26 MG, Take 1 tablet by mouth 2 (two) times daily., Disp: 60 tablet, Rfl: 3 .  spironolactone (ALDACTONE) 25 MG tablet, Take 0.5 tablet (12.5 mg) by mouth once daily , Disp: , Rfl:  .  ticagrelor (BRILINTA) 90  MG TABS tablet, Take 1 tablet (90 mg total) by mouth 2 (two) times daily., Disp: 60 tablet, Rfl: 11 .  torsemide (DEMADEX) 20 MG tablet, Take 4 tablets (80 mg total) by mouth daily., Disp: 120 tablet, Rfl: 11  Past Medical History: Past Medical History:  Diagnosis Date  . Automatic implantable cardioverter-defibrillator in situ   . Chronic CHF (congestive heart failure) (HCC)    a. EF 15-20% b. RHC (09/2013) RA 14, RV 57/22, PA 64/36 (48), PCWP 18, FIck CO/CI 3.7/1.6, PVR 8.1 WU, PA sat 47%   . History of stomach ulcers   . Hypertension   . Hypotension   . Hypothyroidism   . Morbid obesity ([place])   . Myocardial infarction ([place]) 08/2013  . Nocturnal dyspnea   . Nonischemic cardiomyopathy ([place])   . Sinus tachycardia   . Sleep apnea   . Snoring-prob OSA 09/04/2011  . Sprint Fidel[employer] ICD lead RECALL  [id number]   . UARS (upper airway resistance syndrome) 09/04/2011   HST 12/2013:  AHI 4/hr (numerous episodes of airflow reduction that did not have concomitant desaturation)     Tobacco Use: Social History   Tobacco Use  Smoking Status Never Smoker  Smokeless Tobacco Never Used    Labs: Recent Review Flowsheet Data    Labs for ITP Cardiac and Pulmonary Rehab Latest Ref Rng & Units 04/17/2020 04/17/2020 04/17/2020 04/18/2020 08/21/2020  Cholestrol 0 - 200 mg/dL - - - 198 -   LDLCALC 0 - 99 mg/dL - - - 130(H) -   HDL >40 mg/dL - - - 50 -   Trlycerides <150 mg/dL - - 97 92 -   Hemoglobin A1c 4.8 - 5.6 % - - - 5.5 -   PHART 7.350 - 7.450 - 7.341(L) - - 7.414   PCO2ART 32.0 - 48.0 mmHg - 41.4 - - 34.8   HCO3 20.0 - 28.0 mmol/L - 22.4 - - 21.8   TCO2 22 - 32 mmol/L 23 24 - - -   ACIDBASEDEF 0.0 - 2.0 mmol/L - 3.0(H) - - 2.0   O2SAT % - 99.0 - - 98.1       Exercise Target Goals: Exercise Program Goal: Individual exercise prescription set using results from initial 6 min walk test and THRR while considering  patient's activity barriers and safety.   Exercise Prescription Goal: Initial  exercise prescription builds to 30-45 minutes a day of aerobic activity, 2-3 days per week.  Home exercise guidelines will be given to patient during program as part of exercise prescription that the participant will acknowledge.   Education: Aerobic Exercise: - Group verbal and visual presentation on the components of exercise prescription. Introduces F.I.T.T principle from ACSM for exercise prescriptions.  Reviews F.I.T.T. principles of aerobic exercise including progression. Written material given at graduation. Flowsheet Row Cardiac Rehab from 10/03/2020 in [facility] Cardiac and Pulmonary Rehab  Date 08/08/20  Educator [facility]  Instruction Review Code 1- Verbalizes Understanding      Education: Resistance Exercise: - Group verbal and visual presentation on the components of exercise prescription. Introduces F.I.T.T principle from ACSM for exercise prescriptions  Reviews F.I.T.T. principles of resistance exercise including progression. Written material given at graduation.    Education: Exercise & Equipment Safety: - Individual verbal instruction and demonstration of equipment use and safety with use of the equipment. Flowsheet Row Cardiac Rehab from 10/03/2020 in [facility] Cardiac and Pulmonary Rehab  Date 07/26/20  Educator [facility]  Instruction Review Code 1- Verbalizes Understanding      Education: Exercise Physiology & General Exercise Guidelines: - Group verbal and written instruction with models to review the exercise physiology of the cardiovascular system and associated critical values. Provides general exercise guidelines with specific guidelines to those with heart or lung disease.  Flowsheet Row Cardiac Rehab from 10/03/2020 in [facility] Cardiac and Pulmonary Rehab  Date 10/03/20  Educator [facility]  Instruction Review Code 1- Verbalizes Understanding      Education: Flexibility, Balance, Mind/Body Relaxation: - Group verbal and visual presentation with interactive activity on the components of  exercise prescription. Introduces F.I.T.T principle from ACSM for exercise prescriptions. Reviews F.I.T.T. principles of flexibility and balance exercise training including progression. Also discusses the mind body connection.  Reviews various relaxation techniques to help reduce and manage stress (i.e. Deep breathing, progressive muscle relaxation, and visualization). Balance handout provided to take home. Written material given at graduation. Flowsheet Row Cardiac Rehab from 10/03/2020 in [facility] Cardiac and Pulmonary Rehab  Date 08/22/20  Educator [facility]  Instruction Review Code 1- Verbalizes Understanding      Activity Barriers & Risk Stratification:  Activity Barriers & Cardiac Risk Stratification - 08/06/20 1409      Activity Barriers & Cardiac Risk Stratification   Activity Barriers Deconditioning;Muscular Weakness;Balance Concerns;Shortness of Breath;Decreased Ventricular Function;Other (comment)    Comments occasional delays responding since stroke    Cardiac Risk Stratification High  6 Minute Walk:  6 Minute Walk    Row Name 08/06/20 1408 10/08/20 0743       6 Minute Walk   Phase Initial Discharge    Distance 1240 feet 1470 feet    Distance % Change -- 19 %    Distance Feet Change -- 230 ft    Walk Time 6 minutes 6 minutes    # of Rest Breaks 0 0    MPH 2.35 2.78    METS 3.58 3.67    RPE 11 13    Perceived Dyspnea  1 --    VO2 Peak 12.52 12.83    Symptoms Yes (comment) No    Comments hand tingling 2-3/10 (since CVA), SOB --    Resting HR 99 bpm 77 bpm    Resting BP 108/66 110/60    Resting Oxygen Saturation  100 % --    Exercise Oxygen Saturation  during 6 min walk 98 % --    Max Ex. HR 128 bpm 104 bpm    Max Ex. BP 146/74 142/82    2 Minute Post BP 110/60 120/60           Oxygen Initial Assessment:   Oxygen Re-Evaluation:   Oxygen Discharge (Final Oxygen Re-Evaluation):   Initial Exercise Prescription:  Initial Exercise Prescription - 08/06/20  1400      Date of Initial Exercise RX and Referring Provider   Date 08/06/20    Referring Provider [name] MD      Treadmill   MPH 2.3    Grade 1    Minutes 15    METs 3.08      NuStep   Level 3    SPM 80    Minutes 15    METs 3      REL-XR   Level 2    Speed 50    Minutes 15    METs 3      Biostep-RELP   Level 3    SPM 50    Minutes 15    METs 3      Prescription Details   Frequency (times per week) 3    Duration Progress to 30 minutes of continuous aerobic without signs/symptoms of physical distress      Intensity   THRR 40-80% of Max Heartrate 128-157    Ratings of Perceived Exertion 11-13    Perceived Dyspnea 0-4      Progression   Progression Continue to progress workloads to maintain intensity without signs/symptoms of physical distress.      Resistance Training   Training Prescription Yes    Weight 3 lb    Reps 10-15           Perform Capillary Blood Glucose checks as needed.  Exercise Prescription Changes:  Exercise Prescription Changes    Row Name 08/06/20 1400 08/14/20 0800 08/22/20 0700 08/27/20 1200 09/10/20 0800     Response to Exercise   Blood Pressure (Admit) 108/66 144/74 -- 118/70 120/68   Blood Pressure (Exercise) 146/74 140/64 -- 120/72 124/56   Blood Pressure (Exit) 110/60 124/70 -- 102/64 122/60   Heart Rate (Admit) 99 bpm 104 bpm -- 89 bpm 93 bpm   Heart Rate (Exercise) 128 bpm 138 bpm -- 131 bpm 115 bpm   Heart Rate (Exit) 99 bpm 117 bpm -- 83 bpm 85 bpm   Oxygen Saturation (Admit) 100 % -- -- -- --   Oxygen Saturation (Exercise) 98 % -- -- -- --   Rating  of Perceived Exertion (Exercise) 11 12 -- 12 13   Perceived Dyspnea (Exercise) 1 -- -- -- --   Symptoms SOB, hand tingling (2-3/10) none -- none none   Comments walk test results second full day of exercise -- -- --   Duration -- Continue with 30 min of aerobic exercise without signs/symptoms of physical distress. -- Continue with 30 min of aerobic exercise without  signs/symptoms of physical distress. Continue with 30 min of aerobic exercise without signs/symptoms of physical distress.   Intensity -- THRR unchanged -- THRR unchanged THRR unchanged     Progression   Progression -- Continue to progress workloads to maintain intensity without signs/symptoms of physical distress. -- Continue to progress workloads to maintain intensity without signs/symptoms of physical distress. Continue to progress workloads to maintain intensity without signs/symptoms of physical distress.   Average METs -- 2.5 -- 3 2.61     Resistance Training   Training Prescription -- Yes -- Yes Yes   Weight -- 3 lb -- 4 lb 4 lb   Reps -- 10-15 -- 10-15 10-15     Interval Training   Interval Training -- No -- No No     Treadmill   MPH -- 2.3 -- 2.3 2   Grade -- 1 -- 1 1   Minutes -- 15 -- 15 15   METs -- 3.08 -- 3.08 2.72     NuStep   Level -- 3 -- -- 5   Minutes -- 15 -- -- 15   METs -- 2.6 -- -- 2.4     REL-XR   Level -- 2 -- 2 1   Minutes -- 15 -- 15 15   METs -- 2.3 -- 3 --     Biostep-RELP   Level -- 2 -- -- 3   Minutes -- 15 -- -- 15   METs -- 2 -- -- --     Home Exercise Plan   Plans to continue exercise at -- -- Home (comment)  walking, biking, videos, treadmill -- Home (comment)  walking, biking, videos, treadmill   Frequency -- -- Add 2 additional days to program exercise sessions. -- Add 2 additional days to program exercise sessions.   Initial Home Exercises Provided -- -- 08/22/20 -- 08/22/20   Row Name 09/24/20 0900 10/09/20 1600           Response to Exercise   Blood Pressure (Admit) 110/68 110/60      Blood Pressure (Exercise) 118/68 142/82      Blood Pressure (Exit) 108/66 100/60      Heart Rate (Admit) 98 bpm 81 bpm      Heart Rate (Exercise) 144 bpm 127 bpm      Heart Rate (Exit) 114 bpm 80 bpm      Rating of Perceived Exertion (Exercise) 13 13      Symptoms none none      Duration Continue with 30 min of aerobic exercise without  signs/symptoms of physical distress. Continue with 30 min of aerobic exercise without signs/symptoms of physical distress.      Intensity THRR unchanged THRR unchanged             Progression   Progression Continue to progress workloads to maintain intensity without signs/symptoms of physical distress. Continue to progress workloads to maintain intensity without signs/symptoms of physical distress.      Average METs 2.65 2.94             Resistance Training   Training  Prescription Yes Yes      Weight 4 lb 4 lb      Reps 10-15 10-15             Interval Training   Interval Training No No             Treadmill   MPH 2.5 3      Grade 1 1      Minutes 15 15      METs 3.26 3.71             NuStep   Level -- 6      SPM -- 80      Minutes -- 15      METs -- 3.6             REL-XR   Level 3 3      Speed -- 60      Minutes 15 15      METs -- 2.5             Biostep-RELP   Level -- 3      SPM -- 50      Minutes -- 15      METs -- 3             Home Exercise Plan   Plans to continue exercise at Home (comment)  walking, biking, videos, treadmill Home (comment)  walking, biking, videos, treadmill      Frequency Add 2 additional days to program exercise sessions. Add 2 additional days to program exercise sessions.      Initial Home Exercises Provided 08/22/20 08/22/20             Exercise Comments:  Exercise Comments    Row Name 08/08/20 1840           Exercise Comments First full day of exercise!  Patient was oriented to gym and equipment including functions, settings, policies, and procedures.  Patient's individual exercise prescription and treatment plan were reviewed.  All starting workloads were established based on the results of the 6 minute walk test done at initial orientation visit.  The plan for exercise progression was also introduced and progression will be customized based on patient's performance and goals.              Exercise Goals and  Review:  Exercise Goals    Row Name 08/06/20 1412             Exercise Goals   Increase Physical Activity Yes       Intervention Provide advice, education, support and counseling about physical activity/exercise needs.;Develop an individualized exercise prescription for aerobic and resistive training based on initial evaluation findings, risk stratification, comorbidities and participant's personal goals.       Expected Outcomes Short Term: Attend rehab on a regular basis to increase amount of physical activity.;Long Term: Add in home exercise to make exercise part of routine and to increase amount of physical activity.;Long Term: Exercising regularly at least 3-5 days a week.       Increase Strength and Stamina Yes       Intervention Provide advice, education, support and counseling about physical activity/exercise needs.;Develop an individualized exercise prescription for aerobic and resistive training based on initial evaluation findings, risk stratification, comorbidities and participant's personal goals.       Expected Outcomes Short Term: Increase workloads from initial exercise prescription for resistance, speed, and METs.;Short Term: Perform resistance training exercises routinely during rehab and add in  resistance training at home;Long Term: Improve cardiorespiratory fitness, muscular endurance and strength as measured by increased METs and functional capacity (6MWT)       Able to understand and use rate of perceived exertion (RPE) scale Yes       Intervention Provide education and explanation on how to use RPE scale       Expected Outcomes Short Term: Able to use RPE daily in rehab to express subjective intensity level;Long Term:  Able to use RPE to guide intensity level when exercising independently       Able to understand and use Dyspnea scale Yes       Intervention Provide education and explanation on how to use Dyspnea scale       Expected Outcomes Short Term: Able to use Dyspnea  scale daily in rehab to express subjective sense of shortness of breath during exertion;Long Term: Able to use Dyspnea scale to guide intensity level when exercising independently       Knowledge and understanding of Target Heart Rate Range (THRR) Yes       Intervention Provide education and explanation of THRR including how the numbers were predicted and where they are located for reference       Expected Outcomes Short Term: Able to state/look up THRR;Short Term: Able to use daily as guideline for intensity in rehab;Long Term: Able to use THRR to govern intensity when exercising independently       Able to check pulse independently Yes       Intervention Provide education and demonstration on how to check pulse in carotid and radial arteries.;Review the importance of being able to check your own pulse for safety during independent exercise       Expected Outcomes Short Term: Able to explain why pulse checking is important during independent exercise;Long Term: Able to check pulse independently and accurately       Understanding of Exercise Prescription Yes       Intervention Provide education, explanation, and written materials on patient's individual exercise prescription       Expected Outcomes Short Term: Able to explain program exercise prescription;Long Term: Able to explain home exercise prescription to exercise independently              Exercise Goals Re-Evaluation :  Exercise Goals Re-Evaluation    Row Name 08/08/20 0728 08/14/20 0828 08/22/20 0750 08/27/20 0755 09/10/20 0838     Exercise Goal Re-Evaluation   Exercise Goals Review Increase Physical Activity;Able to understand and use rate of perceived exertion (RPE) scale;Understanding of Exercise Prescription;Knowledge and understanding of Target Heart Rate Range (THRR);Increase Strength and Stamina;Able to understand and use Dyspnea scale;Able to check pulse independently Increase Physical Activity;Increase Strength and  Stamina;Understanding of Exercise Prescription Increase Physical Activity;Increase Strength and Stamina;Understanding of Exercise Prescription;Able to understand and use rate of perceived exertion (RPE) scale;Able to understand and use Dyspnea scale;Knowledge and understanding of Target Heart Rate Range (THRR);Able to check pulse independently -- Increase Physical Activity;Increase Strength and Stamina;Understanding of Exercise Prescription   Comments Reviewed RPE and dyspnea scales, THR and program prescription with pt today.  Pt voiced understanding and was given a copy of goals to take home. Judythe is off to a good start in rehab.  She has completed her first two full days of exercise.  We will continue to montior her progress. Reviewed home exercise with pt today.  Pt plans to walk and use equipment at home for exercise.  We also talked about using staff videos  too. Reviewed THR, pulse, RPE, sign and symptoms, pulse oximetery and when to call 911 or MD.  Also discussed weather considerations and indoor options.  Pt voiced understanding. Evangelia is using her TM at home for 20 - 30 min.  We reviewed THR range and how to check pulse. Floyd is doing well in rehab.  She is up to level 5 on the NuStep.  We have not gotten her METs off the XR and will teach her how to find them at the end of her workout.  We will continue to montior her progress.   Expected Outcomes Short: Use RPE daily to regulate intensity. Long: Follow program prescription in THR. Short: Continue to attend rehab regularly  Long: Continue to follow program prescription. Short: Start to add in exercise at home. Long; Continue to improve stamina. Short: monitor HR while exercising Long:  maintain consistent exercise Short: Report METs off XR Long: Continue to improve stamina   Row Name 09/19/20 0745 09/24/20 0920 10/09/20 1618         Exercise Goal Re-Evaluation   Exercise Goals Review Increase Physical Activity;Increase Strength and Stamina  Increase Physical Activity;Increase Strength and Stamina Increase Physical Activity;Increase Strength and Stamina     Comments We reviewed THR range and [name] was able to move up her speed a little on TM.  She is using Tm at home on days not at [facility]. Edwena is progressing well.  She has increased speed and grade on TM.  We will continue to monitor progress. Bri is doing well in rehab. She just completed her discahrge 6MWT and increased by 230 feet.  T4 level has gone up to level 6. Will continue to monitor until graduation.     Expected Outcomes Short: monitor HR when exercising at home Long: improve stamina overall Short:  continue to monitor HR when exercising Long: increase overall MET level Short Graduate Long: Exercise independently at home using appropriate exercise prescription and THR            Discharge Exercise Prescription (Final Exercise Prescription Changes):  Exercise Prescription Changes - 10/09/20 1600      Response to Exercise   Blood Pressure (Admit) 110/60    Blood Pressure (Exercise) 142/82    Blood Pressure (Exit) 100/60    Heart Rate (Admit) 81 bpm    Heart Rate (Exercise) 127 bpm    Heart Rate (Exit) 80 bpm    Rating of Perceived Exertion (Exercise) 13    Symptoms none    Duration Continue with 30 min of aerobic exercise without signs/symptoms of physical distress.    Intensity THRR unchanged      Progression   Progression Continue to progress workloads to maintain intensity without signs/symptoms of physical distress.    Average METs 2.94      Resistance Training   Training Prescription Yes    Weight 4 lb    Reps 10-15      Interval Training   Interval Training No      Treadmill   MPH 3    Grade 1    Minutes 15    METs 3.71      NuStep   Level 6    SPM 80    Minutes 15    METs 3.6      REL-XR   Level 3    Speed 60    Minutes 15    METs 2.5      Biostep-RELP   Level 3    SPM  50    Minutes 15    METs 3      Home Exercise Plan   Plans  to continue exercise at Home (comment)   walking, biking, videos, treadmill   Frequency Add 2 additional days to program exercise sessions.    Initial Home Exercises Provided 08/22/20           Nutrition:  Target Goals: Understanding of nutrition guidelines, daily intake of sodium '1500mg'$ , cholesterol '200mg'$ , calories 30% from fat and 7% or less from saturated fats, daily to have 5 or more servings of fruits and vegetables.  Education: All About Nutrition: -Group instruction provided by verbal, written material, interactive activities, discussions, models, and posters to present general guidelines for heart healthy nutrition including fat, fiber, MyPlate, the role of sodium in heart healthy nutrition, utilization of the nutrition label, and utilization of this knowledge for meal planning. Follow up email sent as well. Written material given at graduation. Flowsheet Row Cardiac Rehab from 10/03/2020 in [facility] Cardiac and Pulmonary Rehab  Education need identified 08/06/20  Date 08/29/20  Educator [place]  Instruction Review Code 1- Verbalizes Understanding      Biometrics:  Pre Biometrics - 08/06/20 1421      Pre Biometrics   Height 5' 4.1" (1.628 m)    Weight 240 lb (108.9 kg)    BMI (Calculated) 41.07    Single Leg Stand 30 seconds           Post Biometrics - 10/08/20 0742       Post  Biometrics   Height 5' 4.1" (1.628 m)    Weight 242 lb 6.4 oz (110 kg)    BMI (Calculated) 41.49    Single Leg Stand 30 seconds           Nutrition Therapy Plan and Nutrition Goals:  Nutrition Therapy & Goals - 08/06/20 0854      Nutrition Therapy   Diet Heart healthy, low Na    Drug/Food Interactions Statins/Certain Fruits    Protein (specify units) 85g    Fiber 25 grams    Whole Grain Foods 3 servings    Saturated Fats 12 max. grams    Fruits and Vegetables 8 servings/day    Sodium 1.5 grams      Personal Nutrition Goals   Nutrition Goal ST: get back into routine of eating form the  wellness center LT: meet kcal and protein needs, increase energy    Comments She reports not eating much right now - textures are hard for her since her stroke. B: milk and protein bar L: salad with protein (chef salad) D: she may or may not eat dinner - 3-4x/week: grilled chicken or pork loin with vegetable and starch ( she reports her husband is a "health nut"). She reports not feeling her hunger cues right now since the stroke. She had been doing the diet from the [facility] and wellness center and now she is not eating much. Her memory has also been poor. The diet was high protein -B: eggs and milk, toast (whole wheat) with a yogurt. L: lean cuisine with salad S: protein bars and string cheese D: 8-10 oz of lean meat, 1-2 cups of vegetables, and 1/2 cup beans. She was monitored by MDs and RDs as part of the program. Reviewed heart healthy eating. She likes the routine she was doing before her surgery and encouraged her to get back to that using mechanical hunger - eating outside of hunger since it is dulled.  Intervention Plan   Intervention Prescribe, educate and counsel regarding individualized specific dietary modifications aiming towards targeted core components such as weight, hypertension, lipid management, diabetes, heart failure and other comorbidities.;Nutrition handout(s) given to patient.    Expected Outcomes Short Term Goal: Understand basic principles of dietary content, such as calories, fat, sodium, cholesterol and nutrients.;Short Term Goal: A plan has been developed with personal nutrition goals set during dietitian appointment.;Long Term Goal: Adherence to prescribed nutrition plan.           Nutrition Assessments:  MEDIFICTS Score Key:  ?70 Need to make dietary changes   40-70 Heart Healthy Diet  ? 40 Therapeutic Level Cholesterol Diet  Flowsheet Row Cardiac Rehab from 08/06/2020 in [facility] Cardiac and Pulmonary Rehab  Picture Your Plate Total Score on Admission 75      Picture Your Plate Scores:  <46 Unhealthy dietary pattern with much room for improvement.  41-50 Dietary pattern unlikely to meet recommendations for good health and room for improvement.  51-60 More healthful dietary pattern, with some room for improvement.   >60 Healthy dietary pattern, although there may be some specific behaviors that could be improved.    Nutrition Goals Re-Evaluation:  Nutrition Goals Re-Evaluation    Row Name 08/27/20 0752 09/19/20 0758 10/03/20 0809         Goals   Nutrition Goal Short: continue to monitor weight and protein Long:  reach goal weight -- Short: continue to monitor weight and protein. Try beans, peanut butter, fish for protein  Long:  reach goal weight     Comment Aloise is eating a meat and vegetables at meals.  She says thye grilled lots o fmeat and vegetables yesterday to have for the week.  Her weight fluctuates alot.  She does send the RN a note if her weight is up and she usually takes an extra fluid pill.  She has increased protein intake. Fannie says shes having a hard time getting meat down.  She doesnt like the texture of meat.  She will talk to RD about other ways to get protein. Alexiya reiterates that she does not care much for chicken or meat - reviewed other good sources of protein including beans, nuts/seeds - especially peanut butter, fish, eggs,  greek yogurt, cottage cheese. She would like to try beans (she likes black beans and pinto beans, fish, and peanut butter on her toast as well as some walnuts.     Expected Outcome -- -- Short: continue to monitor weight and protein. Try beans, peanut butter, fish for protein  Long:  reach goal weight            Nutrition Goals Discharge (Final Nutrition Goals Re-Evaluation):  Nutrition Goals Re-Evaluation - 10/03/20 0809      Goals   Nutrition Goal Short: continue to monitor weight and protein. Try beans, peanut butter, fish for protein  Long:  reach goal weight    Comment Jannelle  reiterates that she does not care much for chicken or meat - reviewed other good sources of protein including beans, nuts/seeds - especially peanut butter, fish, eggs,  greek yogurt, cottage cheese. She would like to try beans (she likes black beans and pinto beans, fish, and peanut butter on her toast as well as some walnuts.    Expected Outcome Short: continue to monitor weight and protein. Try beans, peanut butter, fish for protein  Long:  reach goal weight           Psychosocial: Target Goals:  Acknowledge presence or absence of significant depression and/or stress, maximize coping skills, provide positive support system. Participant is able to verbalize types and ability to use techniques and skills needed for reducing stress and depression.   Education: Stress, Anxiety, and Depression - Group verbal and visual presentation to define topics covered.  Reviews how body is impacted by stress, anxiety, and depression.  Also discusses healthy ways to reduce stress and to treat/manage anxiety and depression.  Written material given at graduation. Flowsheet Row Cardiac Rehab from 10/03/2020 in [facility] Cardiac and Pulmonary Rehab  Education need identified 08/06/20  Date 09/26/20  Educator [facility]  Instruction Review Code 1- [employer] Understanding      Education: Sleep Hygiene -Provides group verbal and written instruction about how sleep can affect your health.  Define sleep hygiene, discuss sleep cycles and impact of sleep habits. Review good sleep hygiene tips.    Initial Review & Psychosocial Screening:  Initial Psych Review & Screening - 07/26/20 0902      Initial Review   Current issues with None Identified      Family Dynamics   Good Support System? Yes    Comments Patient has had a stroke last year and has not done much since then. She can look to her husband, son and daughter for support. She has a somewhat positive outlook on her health.      Barriers   Psychosocial barriers to  participate in program The patient should benefit from training in stress management and relaxation.;There are no identifiable barriers or psychosocial needs.      Screening Interventions   Interventions Encouraged to exercise;Provide feedback about the scores to participant;To provide support and resources with identified psychosocial needs           Quality of Life Scores:   Quality of Life - 08/06/20 1421      Quality of Life   Select Quality of Life      Quality of Life Scores   Health/Function Pre 20.57 %    Socioeconomic Pre 29 %    Psych/Spiritual Pre 28.29 %    Family Pre 30 %    GLOBAL Pre 25.19 %          Scores of 19 and below usually indicate a poorer quality of life in these areas.  A difference of  2-3 points is a clinically meaningful difference.  A difference of 2-3 points in the total score of the Quality of Life Index has been associated with significant improvement in overall quality of life, self-image, physical symptoms, and general health in studies assessing change in quality of life.  PHQ-9: Recent Review Flowsheet Data    Depression screen [facility] 2/9 09/03/2020 08/06/2020 10/05/2017   Decreased Interest 0 0 1   Down, Depressed, Hopeless 0 0 0   PHQ - 2 Score 0 0 1   Altered sleeping $RemoveBeforeDE'1 1 1   'jevuwOhCGddmtrB$ Tired, decreased energy $RemoveBeforeDE'1 2 2   'ycnZPwoiktndmbs$ Change in appetite 0 1 -   Feeling bad or failure about yourself  0 0 1   Trouble concentrating 0 0 0   Moving slowly or fidgety/restless 0 1 0   Suicidal thoughts 0 0 0   PHQ-9 Score $RemoveBef'2 5 5   'yMivXytlrT$ Difficult doing work/chores Not difficult at all Not difficult at all Somewhat difficult     Interpretation of Total Score  Total Score Depression Severity:  1-4 = Minimal depression, 5-9 = Mild depression, 10-14 = Moderate depression, 15-19 = Moderately severe depression,  20-27 = Severe depression   Psychosocial Evaluation and Intervention:  Psychosocial Evaluation - 07/26/20 0904      Psychosocial Evaluation & Interventions    Interventions Encouraged to exercise with the program and follow exercise prescription;Stress management education;Relaxation education    Comments Patient has had a stroke last year and has not done much since then. She can look to her husband, son and daughter for support. She has a somewhat positive outlook on her health.    Expected Outcomes Short: Exercise regularly to support mental health and notify staff of any changes. Long: maintain mental health and well being through teaching of rehab or prescribed medications independently.    Continue Psychosocial Services  Follow up required by staff           Psychosocial Re-Evaluation:  Psychosocial Re-Evaluation    [place] Name 08/27/20 0800 09/03/20 0740 09/19/20 0747         Psychosocial Re-Evaluation   Current issues with Current Stress Concerns;Current Sleep Concerns None Identified Current Sleep Concerns     Comments Rejoice reports no major stress.  Her family is a good support system.  She has some trouble sleeping - has a machine for sleep apnea. Reviewed patient health questionnaire (PHQ-9) with patient for follow up. Previously, patients score indicated signs/symptoms of depression.  Reviewed to see if patient is improving symptom wise while in program.  Score improved and patient states that it is because she has had more energy due to exercising more in rehab. Shaughnessy uses a sleep apnea machine that sometimes gives her trouble.  She does get back to sleep easily.  She reports no depression or anxiety.  She is able to do a lot more at home now.     Expected Outcomes Short: work on better sleep patterns Long:  maintain positive outlook Short: Continue to attend [facility] regularly for regular exercise and social engagement. Long: Continue to improve symptoms and manage a positive mental state. Short:  continue to exercise regularly Long:  maintain positive outlook     Interventions -- Encouraged to attend Cardiac Rehabilitation for the  exercise --     Continue Psychosocial Services  -- No Follow up required --            Psychosocial Discharge (Final Psychosocial Re-Evaluation):  Psychosocial Re-Evaluation - 09/19/20 0747      Psychosocial Re-Evaluation   Current issues with Current Sleep Concerns    Comments Myeisha uses a sleep apnea machine that sometimes gives her trouble.  She does get back to sleep easily.  She reports no depression or anxiety.  She is able to do a lot more at home now.    Expected Outcomes Short:  continue to exercise regularly Long:  maintain positive outlook           Vocational Rehabilitation: Provide vocational rehab assistance to qualifying candidates.   Vocational Rehab Evaluation & Intervention:   Education: Education Goals: Education classes will be provided on a variety of topics geared toward better understanding of heart health and risk factor modification. Participant will state understanding/return demonstration of topics presented as noted by education test scores.  Learning Barriers/Preferences:  Learning Barriers/Preferences - 07/26/20 0901      Learning Barriers/Preferences   Learning Barriers None    Learning Preferences None           General Cardiac Education Topics:  AED/CPR: - Group verbal and written instruction with the use of models to demonstrate the basic use of the AED  with the basic ABC's of resuscitation.   Anatomy and Cardiac Procedures: - Group verbal and visual presentation and models provide information about basic cardiac anatomy and function. Reviews the testing methods done to diagnose heart disease and the outcomes of the test results. Describes the treatment choices: Medical Management, Angioplasty, or Coronary Bypass Surgery for treating various heart conditions including Myocardial Infarction, Angina, Valve Disease, and Cardiac Arrhythmias.  Written material given at graduation. Flowsheet Row Cardiac Rehab from 10/03/2020 in [facility] Cardiac  and Pulmonary Rehab  Education need identified 08/06/20      Medication Safety: - Group verbal and visual instruction to review commonly prescribed medications for heart and lung disease. Reviews the medication, class of the drug, and side effects. Includes the steps to properly store meds and maintain the prescription regimen.  Written material given at graduation. Flowsheet Row Cardiac Rehab from 10/03/2020 in [facility] Cardiac and Pulmonary Rehab  Date 09/05/20  Educator SB  Instruction Review Code 1- Verbalizes Understanding      Intimacy: - Group verbal instruction through game format to discuss how heart and lung disease can affect sexual intimacy. Written material given at graduation.. Flowsheet Row Cardiac Rehab from 10/03/2020 in [facility] Cardiac and Pulmonary Rehab  Date 08/08/20  Educator [facility]  Instruction Review Code 1- Verbalizes Understanding      Know Your Numbers and Heart Failure: - Group verbal and visual instruction to discuss disease risk factors for cardiac and pulmonary disease and treatment options.  Reviews associated critical values for Overweight/Obesity, Hypertension, Cholesterol, and Diabetes.  Discusses basics of heart failure: signs/symptoms and treatments.  Introduces Heart Failure Zone chart for action plan for heart failure.  Written material given at graduation. Flowsheet Row Cardiac Rehab from 10/03/2020 in [facility] Cardiac and Pulmonary Rehab  Date 09/12/20  Educator SB  Instruction Review Code 1- Verbalizes Understanding      Infection Prevention: - Provides verbal and written material to individual with discussion of infection control including proper hand washing and proper equipment cleaning during exercise session. Flowsheet Row Cardiac Rehab from 10/03/2020 in [facility] Cardiac and Pulmonary Rehab  Date 07/26/20  Educator [facility]  Instruction Review Code 1- Verbalizes Understanding      Falls Prevention: - Provides verbal and written material to individual with  discussion of falls prevention and safety. Flowsheet Row Cardiac Rehab from 10/03/2020 in [facility] Cardiac and Pulmonary Rehab  Date 07/26/20  Educator [employer]  Instruction Review Code 1- Verbalizes Understanding      Other: -Provides group and verbal instruction on various topics (see comments)   Knowledge Questionnaire Score:  Knowledge Questionnaire Score - 08/06/20 1422      Knowledge Questionnaire Score   Pre Score 22/26 Education Focus: Depression, Angina, and Nutrition           Core Components/Risk Factors/Patient Goals at Admission:  Personal Goals and Risk Factors at Admission - 08/06/20 1423      Core Components/Risk Factors/Patient Goals on Admission    Weight Management Yes;Weight Loss;Obesity    Intervention Weight Management: Develop a combined nutrition and exercise program designed to reach desired caloric intake, while maintaining appropriate intake of nutrient and fiber, sodium and fats, and appropriate energy expenditure required for the weight goal.;Weight Management: Provide education and appropriate resources to help participant work on and attain dietary goals.;Weight Management/Obesity: Establish reasonable short term and long term weight goals.;Obesity: Provide education and appropriate resources to help participant work on and attain dietary goals.    Admit Weight 240 lb (108.9 kg)  Goal Weight: Short Term 235 lb (106.6 kg)    Goal Weight: Long Term 230 lb (104.3 kg)    Expected Outcomes Short Term: Continue to assess and modify interventions until short term weight is achieved;Long Term: Adherence to nutrition and physical activity/exercise program aimed toward attainment of established weight goal;Weight Loss: Understanding of general recommendations for a balanced deficit meal plan, which promotes 1-2 lb weight loss per week and includes a negative energy balance of [phone] kcal/d;Understanding recommendations for meals to include 15-35% energy as protein, 25-35%  energy from fat, 35-60% energy from carbohydrates, less than $RemoveB'200mg'BVgIIGOC$  of dietary cholesterol, 20-35 gm of total fiber daily;Understanding of distribution of calorie intake throughout the day with the consumption of 4-5 meals/snacks    Heart Failure Yes    Intervention Provide a combined exercise and nutrition program that is supplemented with education, support and counseling about heart failure. Directed toward relieving symptoms such as shortness of breath, decreased exercise tolerance, and extremity edema.    Expected Outcomes Improve functional capacity of life;Short term: Attendance in program 2-3 days a week with increased exercise capacity. Reported lower sodium intake. Reported increased fruit and vegetable intake. Reports medication compliance.;Short term: Daily weights obtained and reported for increase. Utilizing diuretic protocols set by physician.;Long term: Adoption of self-care skills and reduction of barriers for early signs and symptoms recognition and intervention leading to self-care maintenance.    Hypertension Yes    Intervention Monitor prescription use compliance.;Provide education on lifestyle modifcations including regular physical activity/exercise, weight management, moderate sodium restriction and increased consumption of fresh fruit, vegetables, and low fat dairy, alcohol moderation, and smoking cessation.    Expected Outcomes Short Term: Continued assessment and intervention until BP is < 140/23mm HG in hypertensive participants. < 130/8mm HG in hypertensive participants with diabetes, heart failure or chronic kidney disease.;Long Term: Maintenance of blood pressure at goal levels.    Lipids Yes    Intervention Provide education and support for participant on nutrition & aerobic/resistive exercise along with prescribed medications to achieve LDL '70mg'$ , HDL >$Remo'40mg'synBt$ .    Expected Outcomes Short Term: Participant states understanding of desired cholesterol values and is compliant with  medications prescribed. Participant is following exercise prescription and nutrition guidelines.;Long Term: Cholesterol controlled with medications as prescribed, with individualized exercise RX and with personalized nutrition plan. Value goals: LDL < $Rem'70mg'OpMT$ , HDL > 40 mg.           Education:Diabetes - Individual verbal and written instruction to review signs/symptoms of diabetes, desired ranges of glucose level fasting, after meals and with exercise. Acknowledge that pre and post exercise glucose checks will be done for 3 sessions at entry of program.   Core Components/Risk Factors/Patient Goals Review:   Goals and Risk Factor Review    Row Name 08/27/20 0746 09/19/20 0741           Core Components/Risk Factors/Patient Goals Review   Personal Goals Review Weight Management/Obesity;Heart Failure;Lipids Weight Management/Obesity;Heart Failure;Lipids      Review Reyonna is weighing herself daily.  She is hypotensive (BP 102/62 today)  She has a nurse who helps her to manage her fluid levels.  She is taking all medications as directed. Jolynn continues to weigh herself daily.  She had fluid levels checked yesterday and has protocols if she gets fluid overload.  She can tell if she has extra fluid.  Her last cholesterol check was March and she thinks it was in good range.  She takes meds as directed.  Expected Outcomes Short: continue to monitor weight and BPLong: manage risk factors long term Short: continue to manage HF symptoms  Long : control HF long term             Core Components/Risk Factors/Patient Goals at Discharge (Final Review):   Goals and Risk Factor Review - 09/19/20 0741      Core Components/Risk Factors/Patient Goals Review   Personal Goals Review Weight Management/Obesity;Heart Failure;Lipids    Review Erleen continues to weigh herself daily.  She had fluid levels checked yesterday and has protocols if she gets fluid overload.  She can tell if she has extra fluid.  Her  last cholesterol check was March and she thinks it was in good range.  She takes meds as directed.    Expected Outcomes Short: continue to manage HF symptoms  Long : control HF long term           ITP Comments:  ITP Comments    Row Name 07/26/20 0906 08/06/20 1408 08/08/20 0728 08/14/20 0838 08/15/20 0600   ITP Comments Virtual Visit completed. Patient informed on EP and RD appointment and 6 Minute walk test. Patient also informed of patient health questionnaires on My Chart. Patient Verbalizes understanding. Visit diagnosis can be found in [facility] 07/23/2020. Completed 6MWT and gym orientation. Initial ITP created and sent for review to Dr. Emily Filbert, Medical Director. First full day of exercise!  Patient was oriented to gym and equipment including functions, settings, policies, and procedures.  Patient's individual exercise prescription and treatment plan were reviewed.  All starting workloads were established based on the results of the 6 minute walk test done at initial orientation visit.  The plan for exercise progression was also introduced and progression will be customized based on patient's performance and goals. 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  Call into doctor and change in medication therapy. 30 Day review completed. Medical Director ITP review done, changes made as directed, and signed approval by Medical Director.   [place] Name 08/17/20 0848 09/12/20 0704 10/10/20 0858       ITP Comments Cleared to return to rehab.  On new medications.  Will return on Monday 08/21/19 30 Day review completed. Medical Director ITP review done, changes made as directed, and signed approval by Medical Director. 30 Day review completed. Medical Director ITP review done, changes made as directed, and signed approval by Medical Director.            Comments:

## 2020-10-10 NOTE — Progress Notes (Signed)
Daily Session Note  Patient Details  Name: [name] MRN: [id number] Date of Birth: Jun 01, 1971 Referring Provider:   Flowsheet Row Cardiac Rehab from 08/06/2020 in [facility] Cardiac and Pulmonary Rehab  Referring Provider [name] MD      Encounter Date: 10/10/2020  Check In:  Session Check In - 10/10/20 0832      Check-In   Supervising physician immediately available to respond to emergencies See telemetry face sheet for immediately available ER MD    Location [facility]-Cardiac & Pulmonary Rehab    Staff Present Heath Lark, RN, BSN, CCRP;Joseph Foy [place], [place], ACSM CEP, Exercise Physiologist;Melissa Caiola RDN, LDN    Virtual Visit No    Medication changes reported     No    Fall or balance concerns reported    No    Warm-up and Cool-down Performed on first and last piece of equipment    Resistance Training Performed Yes    VAD Patient? No    PAD/SET Patient? No      Pain Assessment   Currently in Pain? No/denies              Social History   Tobacco Use  Smoking Status Never Smoker  Smokeless Tobacco Never Used    Goals Met:  Independence with exercise equipment Exercise tolerated well No report of cardiac concerns or symptoms  Goals Unmet:  Not Applicable  Comments: Pt able to follow exercise prescription today without complaint.  Will continue to monitor for progression.    Dr. Emily Filbert is Medical Director for [place] and [facility] Pulmonary [facility].

## 2020-10-12 ENCOUNTER — Other Ambulatory Visit: Payer: Self-pay

## 2020-10-12 ENCOUNTER — Encounter: Payer: BC Managed Care – PPO | Admitting: *Deleted

## 2020-10-12 DIAGNOSIS — I5022 Chronic systolic (congestive) heart failure: Secondary | ICD-10-CM | POA: Diagnosis not present

## 2020-10-12 NOTE — Progress Notes (Signed)
Daily Session Note  Patient Details  Name: EULAR PANEK MRN: [id number] Date of Birth: 12-04-1970 Referring Provider:   Flowsheet Row Cardiac Rehab from 08/06/2020 in [facility] Cardiac and Pulmonary Rehab  Referring Provider [name] MD      Encounter Date: 10/12/2020  Check In:  Session Check In - 10/12/20 0843      Check-In   Supervising physician immediately available to respond to emergencies See telemetry face sheet for immediately available ER MD    Location [facility]-Cardiac & Pulmonary Rehab    Staff Present Heath Lark, RN, BSN, CCRP;Joseph Lou [place], [place] Exercise Physiologist    Virtual Visit No    Medication changes reported     No    Fall or balance concerns reported    No    Warm-up and Cool-down Performed on first and last piece of equipment    Resistance Training Performed Yes    VAD Patient? No    PAD/SET Patient? No      Pain Assessment   Currently in Pain? No/denies              Social History   Tobacco Use  Smoking Status Never Smoker  Smokeless Tobacco Never Used    Goals Met:  Independence with exercise equipment Exercise tolerated well No report of cardiac concerns or symptoms  Goals Unmet:  Not Applicable  Comments: Pt able to follow exercise prescription today without complaint.  Will continue to monitor for progression.    Dr. Emily Filbert is Medical Director for [place] and [facility].

## 2020-10-15 ENCOUNTER — Other Ambulatory Visit: Payer: Self-pay

## 2020-10-15 ENCOUNTER — Encounter: Payer: BC Managed Care – PPO | Admitting: *Deleted

## 2020-10-15 DIAGNOSIS — I5022 Chronic systolic (congestive) heart failure: Secondary | ICD-10-CM | POA: Diagnosis not present

## 2020-10-15 NOTE — Progress Notes (Signed)
Daily Session Note  Patient Details  Name: YOONA ISHII MRN: [id number] Date of Birth: [birth year] Referring Provider:   Flowsheet Row Cardiac Rehab from 08/06/2020 in [facility] Cardiac and Pulmonary Rehab  Referring Provider [name] MD      Encounter Date: 10/15/2020  Check In:  Session Check In - 10/15/20 0757      Check-In   Supervising physician immediately available to respond to emergencies See telemetry face sheet for immediately available ER MD    Location [facility]-Cardiac & Pulmonary Rehab    Staff Present Heath Lark, RN, BSN, CCRP;Joseph Hood RCP,RRT,BSRT;Kelly [place], [place], ACSM CEP, Exercise Physiologist    Virtual Visit No    Medication changes reported     No    Fall or balance concerns reported    No    Warm-up and Cool-down Performed on first and last piece of equipment    Resistance Training Performed Yes    VAD Patient? No    PAD/SET Patient? No      Pain Assessment   Currently in Pain? No/denies              Social History   Tobacco Use  Smoking Status Never Smoker  Smokeless Tobacco Never Used    Goals Met:  Independence with exercise equipment Exercise tolerated well No report of cardiac concerns or symptoms  Goals Unmet:  Not Applicable  Comments: Pt able to follow exercise prescription today without complaint.  Will continue to monitor for progression.    Dr. Emily Filbert is Medical Director for [facility].

## 2020-10-17 ENCOUNTER — Other Ambulatory Visit: Payer: Self-pay

## 2020-10-17 DIAGNOSIS — I5022 Chronic systolic (congestive) heart failure: Secondary | ICD-10-CM

## 2020-10-17 NOTE — Progress Notes (Signed)
Daily Session Note  Patient Details  Name: [name] MRN: [id number] Date of Birth: [birth year] Referring Provider:   Flowsheet Row Cardiac Rehab from 08/06/2020 in [facility] Cardiac and Pulmonary Rehab  Referring Provider [name] MD      Encounter Date: 10/17/2020  Check In:  Session Check In - 10/17/20 0733      Check-In   Supervising physician immediately available to respond to emergencies See telemetry face sheet for immediately available ER MD    Location [facility]-Cardiac & Pulmonary Rehab    Staff Present Birdie Sons, MPA, RN;Amanda Oletta Darter, BA, ACSM CEP, Exercise Physiologist;Joseph Tessie Fass RCP,RRT,BSRT    Virtual Visit No    Medication changes reported     No    Fall or balance concerns reported    No    Warm-up and Cool-down Performed on first and last piece of equipment    Resistance Training Performed Yes    VAD Patient? No    PAD/SET Patient? No      Pain Assessment   Currently in Pain? No/denies              Social History   Tobacco Use  Smoking Status Never Smoker  Smokeless Tobacco Never Used    Goals Met:  Independence with exercise equipment Exercise tolerated well No report of cardiac concerns or symptoms Strength training completed today  Goals Unmet:  Not Applicable  Comments:  Emon graduated today from  rehab with 36 sessions completed.  Details of the patient's exercise prescription and what She needs to do in order to continue the prescription and progress were discussed with patient.  Patient was given a copy of prescription and goals.  Patient verbalized understanding.  Kimber plans to continue to exercise by walking at home, using staff videos, and riding the bike.     Dr. Emily Filbert is Medical Director for [facility] and [facility].

## 2020-10-17 NOTE — Patient Instructions (Signed)
Discharge Patient Instructions  Patient Details  Name: [name] MRN: [id number] Date of Birth: 1970/08/15 Referring Provider:  Larey Dresser, MD   Number of Visits: 40  Reason for Discharge:  Patient reached a stable level of exercise. Patient independent in their exercise. Patient has met program and personal goals.  Smoking History:  Social History   Tobacco Use  Smoking Status Never Smoker  Smokeless Tobacco Never Used    Diagnosis:  No diagnosis found.  Initial Exercise Prescription:  Initial Exercise Prescription - 08/06/20 1400      Date of Initial Exercise RX and Referring Provider   Date 08/06/20    Referring Provider [name] MD      Treadmill   MPH 2.3    Grade 1    Minutes 15    METs 3.08      NuStep   Level 3    SPM 80    Minutes 15    METs 3      REL-XR   Level 2    Speed 50    Minutes 15    METs 3      Biostep-RELP   Level 3    SPM 50    Minutes 15    METs 3      Prescription Details   Frequency (times per week) 3    Duration Progress to 30 minutes of continuous aerobic without signs/symptoms of physical distress      Intensity   THRR 40-80% of Max Heartrate 128-157    Ratings of Perceived Exertion 11-13    Perceived Dyspnea 0-4      Progression   Progression Continue to progress workloads to maintain intensity without signs/symptoms of physical distress.      Resistance Training   Training Prescription Yes    Weight 3 lb    Reps 10-15           Discharge Exercise Prescription (Final Exercise Prescription Changes):  Exercise Prescription Changes - 10/09/20 1600      Response to Exercise   Blood Pressure (Admit) 110/60    Blood Pressure (Exercise) 142/82    Blood Pressure (Exit) 100/60    Heart Rate (Admit) 81 bpm    Heart Rate (Exercise) 127 bpm    Heart Rate (Exit) 80 bpm    Rating of Perceived Exertion (Exercise) 13    Symptoms none    Duration Continue with 30 min of aerobic exercise without  signs/symptoms of physical distress.    Intensity THRR unchanged      Progression   Progression Continue to progress workloads to maintain intensity without signs/symptoms of physical distress.    Average METs 2.94      Resistance Training   Training Prescription Yes    Weight 4 lb    Reps 10-15      Interval Training   Interval Training No      Treadmill   MPH 3    Grade 1    Minutes 15    METs 3.71      NuStep   Level 6    SPM 80    Minutes 15    METs 3.6      REL-XR   Level 3    Speed 60    Minutes 15    METs 2.5      Biostep-RELP   Level 3    SPM 50    Minutes 15    METs 3  Home Exercise Plan   Plans to continue exercise at Home (comment)   walking, biking, videos, treadmill   Frequency Add 2 additional days to program exercise sessions.    Initial Home Exercises Provided 08/22/20           Functional Capacity:  6 Minute Walk    Row Name 08/06/20 1408 10/08/20 0743       6 Minute Walk   Phase Initial Discharge    Distance 1240 feet 1470 feet    Distance % Change -- 19 %    Distance Feet Change -- 230 ft    Walk Time 6 minutes 6 minutes    # of Rest Breaks 0 0    MPH 2.35 2.78    METS 3.58 3.67    RPE 11 13    Perceived Dyspnea  1 --    VO2 Peak 12.52 12.83    Symptoms Yes (comment) No    Comments hand tingling 2-3/10 (since CVA), SOB --    Resting HR 99 bpm 77 bpm    Resting BP 108/66 110/60    Resting Oxygen Saturation  100 % --    Exercise Oxygen Saturation  during 6 min walk 98 % --    Max Ex. HR 128 bpm 104 bpm    Max Ex. BP 146/74 142/82    2 Minute Post BP 110/60 120/60           Quality of Life:  Quality of Life - 10/12/20 0753      Quality of Life Scores   Health/Function Pre 20.57 %    Health/Function Post 29.6 %    Health/Function % Change 43.9 %    Socioeconomic Pre 29 %    Socioeconomic Post 28.29 %    Socioeconomic % Change  -2.45 %    Psych/Spiritual Pre 28.29 %    Psych/Spiritual Post 30 %     Psych/Spiritual % Change 6.04 %    Family Pre 30 %    Family Post 30 %    Family % Change 0 %    GLOBAL Pre 25.19 %    GLOBAL Post 29.47 %    GLOBAL % Change 16.99 %           Personal Goals: Goals established at orientation with interventions provided to work toward goal.  Personal Goals and Risk Factors at Admission - 08/06/20 1423      Core Components/Risk Factors/Patient Goals on Admission    Weight Management Yes;Weight Loss;Obesity    Intervention Weight Management: Develop a combined nutrition and exercise program designed to reach desired caloric intake, while maintaining appropriate intake of nutrient and fiber, sodium and fats, and appropriate energy expenditure required for the weight goal.;Weight Management: Provide education and appropriate resources to help participant work on and attain dietary goals.;Weight Management/Obesity: Establish reasonable short term and long term weight goals.;Obesity: Provide education and appropriate resources to help participant work on and attain dietary goals.    Admit Weight 240 lb (108.9 kg)    Goal Weight: Short Term 235 lb (106.6 kg)    Goal Weight: Long Term 230 lb (104.3 kg)    Expected Outcomes Short Term: Continue to assess and modify interventions until short term weight is achieved;Long Term: Adherence to nutrition and physical activity/exercise program aimed toward attainment of established weight goal;Weight Loss: Understanding of general recommendations for a balanced deficit meal plan, which promotes 1-2 lb weight loss per week and includes a negative energy balance of [phone]  kcal/d;Understanding recommendations for meals to include 15-35% energy as protein, 25-35% energy from fat, 35-60% energy from carbohydrates, less than 24m of dietary cholesterol, 20-35 gm of total fiber daily;Understanding of distribution of calorie intake throughout the day with the consumption of 4-5 meals/snacks    Heart Failure Yes    Intervention  Provide a combined exercise and nutrition program that is supplemented with education, support and counseling about heart failure. Directed toward relieving symptoms such as shortness of breath, decreased exercise tolerance, and extremity edema.    Expected Outcomes Improve functional capacity of life;Short term: Attendance in program 2-3 days a week with increased exercise capacity. Reported lower sodium intake. Reported increased fruit and vegetable intake. Reports medication compliance.;Short term: Daily weights obtained and reported for increase. Utilizing diuretic protocols set by physician.;Long term: Adoption of self-care skills and reduction of barriers for early signs and symptoms recognition and intervention leading to self-care maintenance.    Hypertension Yes    Intervention Monitor prescription use compliance.;Provide education on lifestyle modifcations including regular physical activity/exercise, weight management, moderate sodium restriction and increased consumption of fresh fruit, vegetables, and low fat dairy, alcohol moderation, and smoking cessation.    Expected Outcomes Short Term: Continued assessment and intervention until BP is < 140/941mHG in hypertensive participants. < 130/8018mG in hypertensive participants with diabetes, heart failure or chronic kidney disease.;Long Term: Maintenance of blood pressure at goal levels.    Lipids Yes    Intervention Provide education and support for participant on nutrition & aerobic/resistive exercise along with prescribed medications to achieve LDL <60m36mDL >40mg68m Expected Outcomes Short Term: Participant states understanding of desired cholesterol values and is compliant with medications prescribed. Participant is following exercise prescription and nutrition guidelines.;Long Term: Cholesterol controlled with medications as prescribed, with individualized exercise RX and with personalized nutrition plan. Value goals: LDL < 60mg,63m > 40  mg.            Personal Goals Discharge:  Goals and Risk Factor Review - 09/19/20 0741      Core Components/Risk Factors/Patient Goals Review   Personal Goals Review Weight Management/Obesity;Heart Failure;Lipids    Review [name] to weigh herself daily.  She had fluid levels checked yesterday and has protocols if she gets fluid overload.  She can tell if she has extra fluid.  Her last cholesterol check was March and she thinks it was in good range.  She takes meds as directed.    Expected Outcomes Short: continue to manage HF symptoms  Long : control HF long term           Exercise Goals and Review:  Exercise Goals    Row Name 08/06/20 1412             Exercise Goals   Increase Physical Activity Yes       Intervention Provide advice, education, support and counseling about physical activity/exercise needs.;Develop an individualized exercise prescription for aerobic and resistive training based on initial evaluation findings, risk stratification, comorbidities and participant's personal goals.       Expected Outcomes Short Term: Attend rehab on a regular basis to increase amount of physical activity.;Long Term: Add in home exercise to make exercise part of routine and to increase amount of physical activity.;Long Term: Exercising regularly at least 3-5 days a week.       Increase Strength and Stamina Yes       Intervention Provide advice, education, support and counseling about physical activity/exercise needs.;Develop an  individualized exercise prescription for aerobic and resistive training based on initial evaluation findings, risk stratification, comorbidities and participant's personal goals.       Expected Outcomes Short Term: Increase workloads from initial exercise prescription for resistance, speed, and METs.;Short Term: Perform resistance training exercises routinely during rehab and add in resistance training at home;Long Term: Improve cardiorespiratory fitness,  muscular endurance and strength as measured by increased METs and functional capacity (6MWT)       Able to understand and use rate of perceived exertion (RPE) scale Yes       Intervention Provide education and explanation on how to use RPE scale       Expected Outcomes Short Term: Able to use RPE daily in rehab to express subjective intensity level;Long Term:  Able to use RPE to guide intensity level when exercising independently       Able to understand and use Dyspnea scale Yes       Intervention Provide education and explanation on how to use Dyspnea scale       Expected Outcomes Short Term: Able to use Dyspnea scale daily in rehab to express subjective sense of shortness of breath during exertion;Long Term: Able to use Dyspnea scale to guide intensity level when exercising independently       Knowledge and understanding of Target Heart Rate Range (THRR) Yes       Intervention Provide education and explanation of THRR including how the numbers were predicted and where they are located for reference       Expected Outcomes Short Term: Able to state/look up THRR;Short Term: Able to use daily as guideline for intensity in rehab;Long Term: Able to use THRR to govern intensity when exercising independently       Able to check pulse independently Yes       Intervention Provide education and demonstration on how to check pulse in carotid and radial arteries.;Review the importance of being able to check your own pulse for safety during independent exercise       Expected Outcomes Short Term: Able to explain why pulse checking is important during independent exercise;Long Term: Able to check pulse independently and accurately       Understanding of Exercise Prescription Yes       Intervention Provide education, explanation, and written materials on patient's individual exercise prescription       Expected Outcomes Short Term: Able to explain program exercise prescription;Long Term: Able to explain home  exercise prescription to exercise independently              Exercise Goals Re-Evaluation:  Exercise Goals Re-Evaluation    Row Name 08/08/20 0728 08/14/20 0828 08/22/20 0750 08/27/20 0755 09/10/20 0838     Exercise Goal Re-Evaluation   Exercise Goals Review Increase Physical Activity;Able to understand and use rate of perceived exertion (RPE) scale;Understanding of Exercise Prescription;Knowledge and understanding of Target Heart Rate Range (THRR);Increase Strength and Stamina;Able to understand and use Dyspnea scale;Able to check pulse independently Increase Physical Activity;Increase Strength and Stamina;Understanding of Exercise Prescription Increase Physical Activity;Increase Strength and Stamina;Understanding of Exercise Prescription;Able to understand and use rate of perceived exertion (RPE) scale;Able to understand and use Dyspnea scale;Knowledge and understanding of Target Heart Rate Range (THRR);Able to check pulse independently -- Increase Physical Activity;Increase Strength and Stamina;Understanding of Exercise Prescription   Comments Reviewed RPE and dyspnea scales, THR and program prescription with pt today.  Pt voiced understanding and was given a copy of goals to take home. [name] is off  to a good start in rehab.  She has completed her first two full days of exercise.  We will continue to montior her progress. Reviewed home exercise with pt today.  Pt plans to walk and use equipment at home for exercise.  We also talked about using staff videos too. Reviewed THR, pulse, RPE, sign and symptoms, pulse oximetery and when to call 911 or MD.  Also discussed weather considerations and indoor options.  Pt voiced understanding. [name] is using her TM at home for 20 - 30 min.  We reviewed THR range and how to check pulse. [name] is doing well in rehab.  She is up to level 5 on the NuStep.  We have not gotten her METs off the XR and will teach her how to find them at the end of her workout.  We will  continue to montior her progress.   Expected Outcomes Short: Use RPE daily to regulate intensity. Long: Follow program prescription in THR. Short: Continue to attend rehab regularly  Long: Continue to follow program prescription. Short: Start to add in exercise at home. Long; Continue to improve stamina. Short: monitor HR while exercising Long:  maintain consistent exercise Short: Report METs off XR Long: Continue to improve stamina   Row Name 09/19/20 0745 09/24/20 0920 10/09/20 1618         Exercise Goal Re-Evaluation   Exercise Goals Review Increase Physical Activity;Increase Strength and Stamina Increase Physical Activity;Increase Strength and Stamina Increase Physical Activity;Increase Strength and Stamina     Comments We reviewed THR range and [name] was able to move up her speed a little on TM.  She is using Tm at home on days not at [facility]. [name] is progressing well.  She has increased speed and grade on TM.  We will continue to monitor progress. [name] is doing well in rehab. She just completed her discahrge 6MWT and increased by 230 feet.  T4 level has gone up to level 6. Will continue to monitor until graduation.     Expected Outcomes Short: monitor HR when exercising at home Long: improve stamina overall Short:  continue to monitor HR when exercising Long: increase overall MET level Short Graduate Long: Exercise independently at home using appropriate exercise prescription and THR            Nutrition & Weight - Outcomes:  Pre Biometrics - 08/06/20 1421      Pre Biometrics   Height 5' 4.1" (1.628 m)    Weight 240 lb (108.9 kg)    BMI (Calculated) 41.07    Single Leg Stand 30 seconds           Post Biometrics - 10/08/20 0742       Post  Biometrics   Height 5' 4.1" (1.628 m)    Weight 242 lb 6.4 oz (110 kg)    BMI (Calculated) 41.49    Single Leg Stand 30 seconds           Nutrition:  Nutrition Therapy & Goals - 08/06/20 0854      Nutrition Therapy   Diet Heart healthy,  low Na    Drug/Food Interactions Statins/Certain Fruits    Protein (specify units) 85g    Fiber 25 grams    Whole Grain Foods 3 servings    Saturated Fats 12 max. grams    Fruits and Vegetables 8 servings/day    Sodium 1.5 grams      Personal Nutrition Goals   Nutrition Goal ST: get back into  routine of eating form the wellness center LT: meet kcal and protein needs, increase energy    Comments She reports not eating much right now - textures are hard for her since her stroke. B: milk and protein bar L: salad with protein (chef salad) D: she may or may not eat dinner - 3-4x/week: grilled chicken or pork loin with vegetable and starch ( she reports her husband is a "health nut"). She reports not feeling her hunger cues right now since the stroke. She had been doing the diet from the [facility] and wellness center and now she is not eating much. Her memory has also been poor. The diet was high protein -B: eggs and milk, toast (whole wheat) with a yogurt. L: lean cuisine with salad S: protein bars and string cheese D: 8-10 oz of lean meat, 1-2 cups of vegetables, and 1/2 cup beans. She was monitored by MDs and RDs as part of the program. Reviewed heart healthy eating. She likes the routine she was doing before her surgery and encouraged her to get back to that using mechanical hunger - eating outside of hunger since it is dulled.      Intervention Plan   Intervention Prescribe, educate and counsel regarding individualized specific dietary modifications aiming towards targeted core components such as weight, hypertension, lipid management, diabetes, heart failure and other comorbidities.;Nutrition handout(s) given to patient.    Expected Outcomes Short Term Goal: Understand basic principles of dietary content, such as calories, fat, sodium, cholesterol and nutrients.;Short Term Goal: A plan has been developed with personal nutrition goals set during dietitian appointment.;Long Term Goal: Adherence to  prescribed nutrition plan.           Nutrition Discharge:   Education Questionnaire Score:  Knowledge Questionnaire Score - 10/12/20 0800      Knowledge Questionnaire Score   Pre Score 22/26 Education Focus: Depression, Angina, and Nutrition    Post Score 26/26           Goals reviewed with patient; copy given to patient.

## 2020-10-17 NOTE — Progress Notes (Signed)
Discharge Progress Report  Patient Details  Name: [name] MRN: [id number] Date of Birth: Feb 05, 1971 Referring Provider:   Flowsheet Row Cardiac Rehab from 08/06/2020 in [facility]  Referring Provider [name] MD       Number of Visits: 36  Reason for Discharge:  Patient reached a stable level of exercise. Patient independent in their exercise. Patient has met program and personal goals.  Smoking History:  Social History   Tobacco Use  Smoking Status Never Smoker  Smokeless Tobacco Never Used    Diagnosis:  Heart failure, chronic systolic (HCC)  ADL UCSD:   Initial Exercise Prescription:  Initial Exercise Prescription - 08/06/20 1400      Date of Initial Exercise RX and Referring Provider   Date 08/06/20    Referring Provider [name] MD      Treadmill   MPH 2.3    Grade 1    Minutes 15    METs 3.08      NuStep   Level 3    SPM 80    Minutes 15    METs 3      REL-XR   Level 2    Speed 50    Minutes 15    METs 3      Biostep-RELP   Level 3    SPM 50    Minutes 15    METs 3      Prescription Details   Frequency (times per week) 3    Duration Progress to 30 minutes of continuous aerobic without signs/symptoms of physical distress      Intensity   THRR 40-80% of Max Heartrate 128-157    Ratings of Perceived Exertion 11-13    Perceived Dyspnea 0-4      Progression   Progression Continue to progress workloads to maintain intensity without signs/symptoms of physical distress.      Resistance Training   Training Prescription Yes    Weight 3 lb    Reps 10-15           Discharge Exercise Prescription (Final Exercise Prescription Changes):  Exercise Prescription Changes - 10/09/20 1600      Response to Exercise   Blood Pressure (Admit) 110/60    Blood Pressure (Exercise) 142/82    Blood Pressure (Exit) 100/60    Heart Rate (Admit) 81 bpm    Heart Rate (Exercise) 127 bpm    Heart Rate (Exit) 80 bpm     Rating of Perceived Exertion (Exercise) 13    Symptoms none    Duration Continue with 30 min of aerobic exercise without signs/symptoms of physical distress.    Intensity THRR unchanged      Progression   Progression Continue to progress workloads to maintain intensity without signs/symptoms of physical distress.    Average METs 2.94      Resistance Training   Training Prescription Yes    Weight 4 lb    Reps 10-15      Interval Training   Interval Training No      Treadmill   MPH 3    Grade 1    Minutes 15    METs 3.71      NuStep   Level 6    SPM 80    Minutes 15    METs 3.6      REL-XR   Level 3    Speed 60    Minutes 15    METs 2.5  Biostep-RELP   Level 3    SPM 50    Minutes 15    METs 3      Home Exercise Plan   Plans to continue exercise at Home (comment)   walking, biking, videos, treadmill   Frequency Add 2 additional days to program exercise sessions.    Initial Home Exercises Provided 08/22/20           Functional Capacity:  6 Minute Walk    Row Name 08/06/20 1408 10/08/20 0743       6 Minute Walk   Phase Initial Discharge    Distance 1240 feet 1470 feet    Distance % Change -- 19 %    Distance Feet Change -- 230 ft    Walk Time 6 minutes 6 minutes    # of Rest Breaks 0 0    MPH 2.35 2.78    METS 3.58 3.67    RPE 11 13    Perceived Dyspnea  1 --    VO2 Peak 12.52 12.83    Symptoms Yes (comment) No    Comments hand tingling 2-3/10 (since CVA), SOB --    Resting HR 99 bpm 77 bpm    Resting BP 108/66 110/60    Resting Oxygen Saturation  100 % --    Exercise Oxygen Saturation  during 6 min walk 98 % --    Max Ex. HR 128 bpm 104 bpm    Max Ex. BP 146/74 142/82    2 Minute Post BP 110/60 120/60           Psychological, QOL, Others - Outcomes: PHQ 2/9: Depression screen [facility] 2/9 09/03/2020 08/06/2020  Decreased Interest 0 0  Down, Depressed, Hopeless 0 0  PHQ - 2 Score 0 0  Altered sleeping 1 1  Tired, decreased energy 1 2   Change in appetite 0 1  Feeling bad or failure about yourself  0 0  Trouble concentrating 0 0  Moving slowly or fidgety/restless 0 1  Suicidal thoughts 0 0  PHQ-9 Score 2 5  Difficult doing work/chores Not difficult at all Not difficult at all  Some recent data might be hidden    Quality of Life:  Quality of Life - 10/12/20 0753      Quality of Life Scores   Health/Function Pre 20.57 %    Health/Function Post 29.6 %    Health/Function % Change 43.9 %    Socioeconomic Pre 29 %    Socioeconomic Post 28.29 %    Socioeconomic % Change  -2.45 %    Psych/Spiritual Pre 28.29 %    Psych/Spiritual Post 30 %    Psych/Spiritual % Change 6.04 %    Family Pre 30 %    Family Post 30 %    Family % Change 0 %    GLOBAL Pre 25.19 %    GLOBAL Post 29.47 %    GLOBAL % Change 16.99 %           Nutrition & Weight - Outcomes:  Pre Biometrics - 08/06/20 1421      Pre Biometrics   Height 5' 4.1" (1.628 m)    Weight 240 lb (108.9 kg)    BMI (Calculated) 41.07    Single Leg Stand 30 seconds           Post Biometrics - 10/08/20 0742       Post  Biometrics   Height 5' 4.1" (1.628 m)    Weight 242 lb 6.4  oz (110 kg)    BMI (Calculated) 41.49    Single Leg Stand 30 seconds           Nutrition:  Nutrition Therapy & Goals - 08/06/20 0854      Nutrition Therapy   Diet Heart healthy, low Na    Drug/Food Interactions Statins/Certain Fruits    Protein (specify units) 85g    Fiber 25 grams    Whole Grain Foods 3 servings    Saturated Fats 12 max. grams    Fruits and Vegetables 8 servings/day    Sodium 1.5 grams      Personal Nutrition Goals   Nutrition Goal ST: get back into routine of eating form the wellness center LT: meet kcal and protein needs, increase energy    Comments She reports not eating much right now - textures are hard for her since her stroke. B: milk and protein bar L: salad with protein (chef salad) D: she may or may not eat dinner - 3-4x/week: grilled chicken  or pork loin with vegetable and starch ( she reports her husband is a "health nut"). She reports not feeling her hunger cues right now since the stroke. She had been doing the diet from the [facility] and now she is not eating much. Her memory has also been poor. The diet was high protein -B: eggs and milk, toast (whole wheat) with a yogurt. L: lean cuisine with salad S: protein bars and string cheese D: 8-10 oz of lean meat, 1-2 cups of vegetables, and 1/2 cup beans. She was monitored by MDs and RDs as part of the program. Reviewed heart healthy eating. She likes the routine she was doing before her surgery and encouraged her to get back to that using mechanical hunger - eating outside of hunger since it is dulled.      Intervention Plan   Intervention Prescribe, educate and counsel regarding individualized specific dietary modifications aiming towards targeted core components such as weight, hypertension, lipid management, diabetes, heart failure and other comorbidities.;Nutrition handout(s) given to patient.    Expected Outcomes Short Term Goal: Understand basic principles of dietary content, such as calories, fat, sodium, cholesterol and nutrients.;Short Term Goal: A plan has been developed with personal nutrition goals set during dietitian appointment.;Long Term Goal: Adherence to prescribed nutrition plan.           Nutrition Discharge:   Education Questionnaire Score:  Knowledge Questionnaire Score - 10/12/20 0800      Knowledge Questionnaire Score   Pre Score 22/26 Education Focus: Depression, Angina, and Nutrition    Post Score 26/26           Goals reviewed with patient; copy given to patient.

## 2020-10-17 NOTE — Progress Notes (Signed)
Cardiac Individual Treatment Plan  Patient Details  Name: [name] MRN: [id number] Date of Birth: Feb 10, 1971 Referring Provider:   Flowsheet Row Cardiac Rehab from 08/06/2020 in [facility] Cardiac and Pulmonary Rehab  Referring Provider [name] MD      Initial Encounter Date:  Flowsheet Row Cardiac Rehab from 08/06/2020 in [facility] Cardiac and Pulmonary Rehab  Date 08/06/20      Visit Diagnosis: Heart failure, chronic systolic (HCC)  Patient's Home Medications on Admission:  Current Outpatient Medications:  .  amiodarone (PACERONE) 200 MG tablet, Take 1.5 tablets (300 mg) by mouth once daily, Disp: , Rfl:  .  aspirin 81 MG chewable tablet, Chew 1 tablet (81 mg total) by mouth daily., Disp: 30 tablet, Rfl: 11 .  atorvastatin (LIPITOR) 40 MG tablet, Take 40 mg by mouth daily., Disp: , Rfl:  .  carvedilol (COREG) 3.125 MG tablet, Take 1 tablet (3.125 mg total) by mouth 2 (two) times daily with a meal., Disp: 180 tablet, Rfl: 1 .  digoxin (LANOXIN) 0.125 MG tablet, Take 1 tablet (0.125 mg) by mouth once every other day, Disp: , Rfl:  .  FARXIGA 10 MG TABS tablet, TAKE 1 TABLET BY MOUTH ONCE A DAY BEFOREBREAKFAST, Disp: 30 tablet, Rfl: 5 .  levothyroxine (SYNTHROID) 25 MCG tablet, Take 1 tablet (25 mcg total) by mouth daily before breakfast., Disp: 90 tablet, Rfl: 3 .  metolazone (ZAROXOLYN) 2.5 MG tablet, Take 1 tablet (2.5 mg total) by mouth as needed (extra fluid as directed by HF clinic)., Disp: 5 tablet, Rfl: 3 .  potassium chloride SA (KLOR-CON) 20 MEQ tablet, Take 40 mEq by mouth 2 (two) times daily., Disp: , Rfl:  .  quiniDINE gluconate 324 MG CR tablet, Take 1 tablet (324 mg total) by mouth 2 (two) times daily., Disp: 60 tablet, Rfl: 6 .  sacubitril-valsartan (ENTRESTO) 24-26 MG, Take 1 tablet by mouth 2 (two) times daily., Disp: 60 tablet, Rfl: 3 .  spironolactone (ALDACTONE) 25 MG tablet, Take 0.5 tablet (12.5 mg) by mouth once daily , Disp: , Rfl:  .  ticagrelor (BRILINTA) 90  MG TABS tablet, Take 1 tablet (90 mg total) by mouth 2 (two) times daily., Disp: 60 tablet, Rfl: 11 .  torsemide (DEMADEX) 20 MG tablet, Take 4 tablets (80 mg total) by mouth daily., Disp: 120 tablet, Rfl: 11  Past Medical History: Past Medical History:  Diagnosis Date  . Automatic implantable cardioverter-defibrillator in situ   . Chronic CHF (congestive heart failure) (HCC)    a. EF 15-20% b. RHC (09/2013) RA 14, RV 57/22, PA 64/36 (48), PCWP 18, FIck CO/CI 3.7/1.6, PVR 8.1 WU, PA sat 47%   . History of stomach ulcers   . Hypertension   . Hypotension   . Hypothyroidism   . Morbid obesity ([place])   . Myocardial infarction ([place]) 08/2013  . Nocturnal dyspnea   . Nonischemic cardiomyopathy ([place])   . Sinus tachycardia   . Sleep apnea   . Snoring-prob OSA 09/04/2011  . Sprint Fidelis ICD lead RECALL  [id number]   . UARS (upper airway resistance syndrome) 09/04/2011   HST 12/2013:  AHI 4/hr (numerous episodes of airflow reduction that did not have concomitant desaturation)     Tobacco Use: Social History   Tobacco Use  Smoking Status Never Smoker  Smokeless Tobacco Never Used    Labs: Recent Review Flowsheet Data    Labs for ITP Cardiac and Pulmonary Rehab Latest Ref Rng & Units 04/17/2020 04/17/2020 04/17/2020 04/18/2020 08/21/2020  Cholestrol 0 - 200 mg/dL - - - 198 -   LDLCALC 0 - 99 mg/dL - - - 130(H) -   HDL >40 mg/dL - - - 50 -   Trlycerides <150 mg/dL - - 97 92 -   Hemoglobin A1c 4.8 - 5.6 % - - - 5.5 -   PHART 7.350 - 7.450 - 7.341(L) - - 7.414   PCO2ART 32.0 - 48.0 mmHg - 41.4 - - 34.8   HCO3 20.0 - 28.0 mmol/L - 22.4 - - 21.8   TCO2 22 - 32 mmol/L 23 24 - - -   ACIDBASEDEF 0.0 - 2.0 mmol/L - 3.0(H) - - 2.0   O2SAT % - 99.0 - - 98.1       Exercise Target Goals: Exercise Program Goal: Individual exercise prescription set using results from initial 6 min walk test and THRR while considering  patient's activity barriers and safety.   Exercise Prescription Goal: Initial  exercise prescription builds to 30-45 minutes a day of aerobic activity, 2-3 days per week.  Home exercise guidelines will be given to patient during program as part of exercise prescription that the participant will acknowledge.   Education: Aerobic Exercise: - Group verbal and visual presentation on the components of exercise prescription. Introduces F.I.T.T principle from ACSM for exercise prescriptions.  Reviews F.I.T.T. principles of aerobic exercise including progression. Written material given at graduation. Flowsheet Row Cardiac Rehab from 10/17/2020 in [facility] Cardiac and Pulmonary Rehab  Date 08/08/20  Educator [facility]  Instruction Review Code 1- Verbalizes Understanding      Education: Resistance Exercise: - Group verbal and visual presentation on the components of exercise prescription. Introduces F.I.T.T principle from ACSM for exercise prescriptions  Reviews F.I.T.T. principles of resistance exercise including progression. Written material given at graduation. Flowsheet Row Cardiac Rehab from 10/17/2020 in [facility] Cardiac and Pulmonary Rehab  Date 10/17/20  Educator [facility]  Instruction Review Code 1- Verbalizes Understanding       Education: Exercise & Equipment Safety: - Individual verbal instruction and demonstration of equipment use and safety with use of the equipment. Flowsheet Row Cardiac Rehab from 10/17/2020 in [facility] Cardiac and Pulmonary Rehab  Date 07/26/20  Educator [facility]  Instruction Review Code 1- Verbalizes Understanding      Education: Exercise Physiology & General Exercise Guidelines: - Group verbal and written instruction with models to review the exercise physiology of the cardiovascular system and associated critical values. Provides general exercise guidelines with specific guidelines to those with heart or lung disease.  Flowsheet Row Cardiac Rehab from 10/17/2020 in [facility] Cardiac and Pulmonary Rehab  Date 10/03/20  Educator [facility]  Instruction Review Code 1- Verbalizes  Understanding      Education: Flexibility, Balance, Mind/Body Relaxation: - Group verbal and visual presentation with interactive activity on the components of exercise prescription. Introduces F.I.T.T principle from ACSM for exercise prescriptions. Reviews F.I.T.T. principles of flexibility and balance exercise training including progression. Also discusses the mind body connection.  Reviews various relaxation techniques to help reduce and manage stress (i.e. Deep breathing, progressive muscle relaxation, and visualization). Balance handout provided to take home. Written material given at graduation. Flowsheet Row Cardiac Rehab from 10/17/2020 in [facility] Cardiac and Pulmonary Rehab  Date 08/22/20  Educator [facility]  Instruction Review Code 1- Verbalizes Understanding      Activity Barriers & Risk Stratification:  Activity Barriers & Cardiac Risk Stratification - 08/06/20 1409      Activity Barriers & Cardiac Risk Stratification   Activity Barriers Deconditioning;Muscular Weakness;Balance Concerns;Shortness  of Breath;Decreased Ventricular Function;Other (comment)    Comments occasional delays responding since stroke    Cardiac Risk Stratification High           6 Minute Walk:  [place] Name 08/06/20 1408 10/08/20 0743       6 Minute Walk   Phase Initial Discharge    Distance 1240 feet 1470 feet    Distance % Change -- 19 %    Distance Feet Change -- 230 ft    Walk Time 6 minutes 6 minutes    # of Rest Breaks 0 0    MPH 2.35 2.78    METS 3.58 3.67    RPE 11 13    Perceived Dyspnea  1 --    VO2 Peak 12.52 12.83    Symptoms Yes (comment) No    Comments hand tingling 2-3/10 (since CVA), SOB --    Resting HR 99 bpm 77 bpm    Resting BP 108/66 110/60    Resting Oxygen Saturation  100 % --    Exercise Oxygen Saturation  during 6 min walk 98 % --    Max Ex. HR 128 bpm 104 bpm    Max Ex. BP 146/74 142/82    2 Minute Post BP 110/60 120/60           Oxygen Initial  Assessment:   Oxygen Re-Evaluation:   Oxygen Discharge (Final Oxygen Re-Evaluation):   Initial Exercise Prescription:  Initial Exercise Prescription - 08/06/20 1400      Date of Initial Exercise RX and Referring Provider   Date 08/06/20    Referring Provider [name] MD      Treadmill   MPH 2.3    Grade 1    Minutes 15    METs 3.08      NuStep   Level 3    SPM 80    Minutes 15    METs 3      REL-XR   Level 2    Speed 50    Minutes 15    METs 3      Biostep-RELP   Level 3    SPM 50    Minutes 15    METs 3      Prescription Details   Frequency (times per week) 3    Duration Progress to 30 minutes of continuous aerobic without signs/symptoms of physical distress      Intensity   THRR 40-80% of Max Heartrate 128-157    Ratings of Perceived Exertion 11-13    Perceived Dyspnea 0-4      Progression   Progression Continue to progress workloads to maintain intensity without signs/symptoms of physical distress.      Resistance Training   Training Prescription Yes    Weight 3 lb    Reps 10-15           Perform Capillary Blood Glucose checks as needed.  Exercise Prescription Changes:  Exercise Prescription Changes    Row Name 08/06/20 1400 08/14/20 0800 08/22/20 0700 08/27/20 1200 09/10/20 0800     Response to Exercise   Blood Pressure (Admit) 108/66 144/74 -- 118/70 120/68   Blood Pressure (Exercise) 146/74 140/64 -- 120/72 124/56   Blood Pressure (Exit) 110/60 124/70 -- 102/64 122/60   Heart Rate (Admit) 99 bpm 104 bpm -- 89 bpm 93 bpm   Heart Rate (Exercise) 128 bpm 138 bpm -- 131 bpm 115 bpm   Heart Rate (Exit) 99 bpm 117  bpm -- 83 bpm 85 bpm   Oxygen Saturation (Admit) 100 % -- -- -- --   Oxygen Saturation (Exercise) 98 % -- -- -- --   Rating of Perceived Exertion (Exercise) 11 12 -- 12 13   Perceived Dyspnea (Exercise) 1 -- -- -- --   Symptoms SOB, hand tingling (2-3/10) none -- none none   Comments walk test results second full day of  exercise -- -- --   Duration -- Continue with 30 min of aerobic exercise without signs/symptoms of physical distress. -- Continue with 30 min of aerobic exercise without signs/symptoms of physical distress. Continue with 30 min of aerobic exercise without signs/symptoms of physical distress.   Intensity -- THRR unchanged -- THRR unchanged THRR unchanged     Progression   Progression -- Continue to progress workloads to maintain intensity without signs/symptoms of physical distress. -- Continue to progress workloads to maintain intensity without signs/symptoms of physical distress. Continue to progress workloads to maintain intensity without signs/symptoms of physical distress.   Average METs -- 2.5 -- 3 2.61     Resistance Training   Training Prescription -- Yes -- Yes Yes   Weight -- 3 lb -- 4 lb 4 lb   Reps -- 10-15 -- 10-15 10-15     Interval Training   Interval Training -- No -- No No     Treadmill   MPH -- 2.3 -- 2.3 2   Grade -- 1 -- 1 1   Minutes -- 15 -- 15 15   METs -- 3.08 -- 3.08 2.72     NuStep   Level -- 3 -- -- 5   Minutes -- 15 -- -- 15   METs -- 2.6 -- -- 2.4     REL-XR   Level -- 2 -- 2 1   Minutes -- 15 -- 15 15   METs -- 2.3 -- 3 --     Biostep-RELP   Level -- 2 -- -- 3   Minutes -- 15 -- -- 15   METs -- 2 -- -- --     Home Exercise Plan   Plans to continue exercise at -- -- Home (comment)  walking, biking, videos, treadmill -- Home (comment)  walking, biking, videos, treadmill   Frequency -- -- Add 2 additional days to program exercise sessions. -- Add 2 additional days to program exercise sessions.   Initial Home Exercises Provided -- -- 08/22/20 -- 08/22/20   Row Name 09/24/20 0900 10/09/20 1600           Response to Exercise   Blood Pressure (Admit) 110/68 110/60      Blood Pressure (Exercise) 118/68 142/82      Blood Pressure (Exit) 108/66 100/60      Heart Rate (Admit) 98 bpm 81 bpm      Heart Rate (Exercise) 144 bpm 127 bpm      Heart Rate  (Exit) 114 bpm 80 bpm      Rating of Perceived Exertion (Exercise) 13 13      Symptoms none none      Duration Continue with 30 min of aerobic exercise without signs/symptoms of physical distress. Continue with 30 min of aerobic exercise without signs/symptoms of physical distress.      Intensity THRR unchanged THRR unchanged             Progression   Progression Continue to progress workloads to maintain intensity without signs/symptoms of physical distress. Continue to progress workloads to maintain intensity  without signs/symptoms of physical distress.      Average METs 2.65 2.94             Resistance Training   Training Prescription Yes Yes      Weight 4 lb 4 lb      Reps 10-15 10-15             Interval Training   Interval Training No No             Treadmill   MPH 2.5 3      Grade 1 1      Minutes 15 15      METs 3.26 3.71             NuStep   Level -- 6      SPM -- 80      Minutes -- 15      METs -- 3.6             REL-XR   Level 3 3      Speed -- 60      Minutes 15 15      METs -- 2.5             Biostep-RELP   Level -- 3      SPM -- 50      Minutes -- 15      METs -- 3             Home Exercise Plan   Plans to continue exercise at Home (comment)  walking, biking, videos, treadmill Home (comment)  walking, biking, videos, treadmill      Frequency Add 2 additional days to program exercise sessions. Add 2 additional days to program exercise sessions.      Initial Home Exercises Provided 08/22/20 08/22/20             Exercise Comments:  Exercise Comments    Row Name 08/08/20 5374           Exercise Comments First full day of exercise!  Patient was oriented to gym and equipment including functions, settings, policies, and procedures.  Patient's individual exercise prescription and treatment plan were reviewed.  All starting workloads were established based on the results of the 6 minute walk test done at initial orientation visit.  The plan for  exercise progression was also introduced and progression will be customized based on patient's performance and goals.              Exercise Goals and Review:  Exercise Goals    Row Name 08/06/20 1412             Exercise Goals   Increase Physical Activity Yes       Intervention Provide advice, education, support and counseling about physical activity/exercise needs.;Develop an individualized exercise prescription for aerobic and resistive training based on initial evaluation findings, risk stratification, comorbidities and participant's personal goals.       Expected Outcomes Short Term: Attend rehab on a regular basis to increase amount of physical activity.;Long Term: Add in home exercise to make exercise part of routine and to increase amount of physical activity.;Long Term: Exercising regularly at least 3-5 days a week.       Increase Strength and Stamina Yes       Intervention Provide advice, education, support and counseling about physical activity/exercise needs.;Develop an individualized exercise prescription for aerobic and resistive training based on initial evaluation findings, risk stratification, comorbidities and participant's personal goals.  Expected Outcomes Short Term: Increase workloads from initial exercise prescription for resistance, speed, and METs.;Short Term: Perform resistance training exercises routinely during rehab and add in resistance training at home;Long Term: Improve cardiorespiratory fitness, muscular endurance and strength as measured by increased METs and functional capacity (6MWT)       Able to understand and use rate of perceived exertion (RPE) scale Yes       Intervention Provide education and explanation on how to use RPE scale       Expected Outcomes Short Term: Able to use RPE daily in rehab to express subjective intensity level;Long Term:  Able to use RPE to guide intensity level when exercising independently       Able to understand and use  Dyspnea scale Yes       Intervention Provide education and explanation on how to use Dyspnea scale       Expected Outcomes Short Term: Able to use Dyspnea scale daily in rehab to express subjective sense of shortness of breath during exertion;Long Term: Able to use Dyspnea scale to guide intensity level when exercising independently       Knowledge and understanding of Target Heart Rate Range (THRR) Yes       Intervention Provide education and explanation of THRR including how the numbers were predicted and where they are located for reference       Expected Outcomes Short Term: Able to state/look up THRR;Short Term: Able to use daily as guideline for intensity in rehab;Long Term: Able to use THRR to govern intensity when exercising independently       Able to check pulse independently Yes       Intervention Provide education and demonstration on how to check pulse in carotid and radial arteries.;Review the importance of being able to check your own pulse for safety during independent exercise       Expected Outcomes Short Term: Able to explain why pulse checking is important during independent exercise;Long Term: Able to check pulse independently and accurately       Understanding of Exercise Prescription Yes       Intervention Provide education, explanation, and written materials on patient's individual exercise prescription       Expected Outcomes Short Term: Able to explain program exercise prescription;Long Term: Able to explain home exercise prescription to exercise independently              Exercise Goals Re-Evaluation :  Exercise Goals Re-Evaluation    Row Name 08/08/20 0728 08/14/20 0828 08/22/20 0750 08/27/20 0755 09/10/20 0838     Exercise Goal Re-Evaluation   Exercise Goals Review Increase Physical Activity;Able to understand and use rate of perceived exertion (RPE) scale;Understanding of Exercise Prescription;Knowledge and understanding of Target Heart Rate Range (THRR);Increase  Strength and Stamina;Able to understand and use Dyspnea scale;Able to check pulse independently Increase Physical Activity;Increase Strength and Stamina;Understanding of Exercise Prescription Increase Physical Activity;Increase Strength and Stamina;Understanding of Exercise Prescription;Able to understand and use rate of perceived exertion (RPE) scale;Able to understand and use Dyspnea scale;Knowledge and understanding of Target Heart Rate Range (THRR);Able to check pulse independently -- Increase Physical Activity;Increase Strength and Stamina;Understanding of Exercise Prescription   Comments Reviewed RPE and dyspnea scales, THR and program prescription with pt today.  Pt voiced understanding and was given a copy of goals to take home. [name] is off to a good start in rehab.  She has completed her first two full days of exercise.  We will continue to montior her progress.  Reviewed home exercise with pt today.  Pt plans to walk and use equipment at home for exercise.  We also talked about using staff videos too. Reviewed THR, pulse, RPE, sign and symptoms, pulse oximetery and when to call 911 or MD.  Also discussed weather considerations and indoor options.  Pt voiced understanding. [name] is using her TM at home for 20 - 30 min.  We reviewed THR range and how to check pulse. [name] is doing well in rehab.  She is up to level 5 on the NuStep.  We have not gotten her METs off the XR and will teach her how to find them at the end of her workout.  We will continue to montior her progress.   Expected Outcomes Short: Use RPE daily to regulate intensity. Long: Follow program prescription in THR. Short: Continue to attend rehab regularly  Long: Continue to follow program prescription. Short: Start to add in exercise at home. Long; Continue to improve stamina. Short: monitor HR while exercising Long:  maintain consistent exercise Short: Report METs off XR Long: Continue to improve stamina   Row Name 09/19/20 0745 09/24/20  0920 10/09/20 1618         Exercise Goal Re-Evaluation   Exercise Goals Review Increase Physical Activity;Increase Strength and Stamina Increase Physical Activity;Increase Strength and Stamina Increase Physical Activity;Increase Strength and Stamina     Comments We reviewed THR range and [name] was able to move up her speed a little on TM.  She is using Tm at home on days not at [facility]. [name] is progressing well.  She has increased speed and grade on TM.  We will continue to monitor progress. [name] is doing well in rehab. She just completed her discahrge 6MWT and increased by 230 feet.  [name] level has gone up to level 6. Will continue to monitor until graduation.     Expected Outcomes Short: monitor HR when exercising at home Long: improve stamina overall Short:  continue to monitor HR when exercising Long: increase overall MET level Short Graduate Long: Exercise independently at home using appropriate exercise prescription and THR            Discharge Exercise Prescription (Final Exercise Prescription Changes):  Exercise Prescription Changes - 10/09/20 1600      Response to Exercise   Blood Pressure (Admit) 110/60    Blood Pressure (Exercise) 142/82    Blood Pressure (Exit) 100/60    Heart Rate (Admit) 81 bpm    Heart Rate (Exercise) 127 bpm    Heart Rate (Exit) 80 bpm    Rating of Perceived Exertion (Exercise) 13    Symptoms none    Duration Continue with 30 min of aerobic exercise without signs/symptoms of physical distress.    Intensity THRR unchanged      Progression   Progression Continue to progress workloads to maintain intensity without signs/symptoms of physical distress.    Average METs 2.94      Resistance Training   Training Prescription Yes    Weight 4 lb    Reps 10-15      Interval Training   Interval Training No      Treadmill   MPH 3    Grade 1    Minutes 15    METs 3.71      NuStep   Level 6    SPM 80    Minutes 15    METs 3.6      REL-XR   Level 3  Speed 60    Minutes 15    METs 2.5      Biostep-RELP   Level 3    SPM 50    Minutes 15    METs 3      Home Exercise Plan   Plans to continue exercise at Home (comment)   walking, biking, videos, treadmill   Frequency Add 2 additional days to program exercise sessions.    Initial Home Exercises Provided 08/22/20           Nutrition:  Target Goals: Understanding of nutrition guidelines, daily intake of sodium <1559m, cholesterol <2041m calories 30% from fat and 7% or less from saturated fats, daily to have 5 or more servings of fruits and vegetables.  Education: All About Nutrition: -Group instruction provided by verbal, written material, interactive activities, discussions, models, and posters to present general guidelines for heart healthy nutrition including fat, fiber, MyPlate, the role of sodium in heart healthy nutrition, utilization of the nutrition label, and utilization of this knowledge for meal planning. Follow up email sent as well. Written material given at graduation. Flowsheet [facility]  Education need identified 08/06/20  Date 08/29/20  Educator MCNellieInstruction Review Code 1- Verbalizes Understanding      Biometrics:  Pre Biometrics - 08/06/20 1421      Pre Biometrics   Height 5' 4.1" (1.628 m)    Weight 240 lb (108.9 kg)    BMI (Calculated) 41.07    Single Leg Stand 30 seconds           Post Biometrics - 10/08/20 0742       Post  Biometrics   Height 5' 4.1" (1.628 m)    Weight 242 lb 6.4 oz (110 kg)    BMI (Calculated) 41.49    Single Leg Stand 30 seconds           Nutrition Therapy Plan and Nutrition Goals:  Nutrition Therapy & Goals - 08/06/20 0854      Nutrition Therapy   Diet Heart healthy, low Na    Drug/Food Interactions Statins/Certain Fruits    Protein (specify units) 85g    Fiber 25 grams    Whole Grain Foods 3 servings    Saturated Fats 12 max. grams    Fruits and  Vegetables 8 servings/day    Sodium 1.5 grams      Personal Nutrition Goals   Nutrition Goal ST: get back into routine of eating form the wellness center LT: meet kcal and protein needs, increase energy    Comments She reports not eating much right now - textures are hard for her since her stroke. B: milk and protein bar L: salad with protein (chef salad) D: she may or may not eat dinner - 3-4x/week: grilled chicken or pork loin with vegetable and starch ( she reports her husband is a "health nut"). She reports not feeling her hunger cues right now since the stroke. She had been doing the diet from the health and wellness center and now she is not eating much. Her memory has also been poor. The diet was high protein -B: eggs and milk, toast (whole wheat) with a yogurt. L: lean cuisine with salad S: protein bars and string cheese D: 8-10 oz of lean meat, 1-2 cups of vegetables, and 1/2 cup beans. She was monitored by MDs and RDs as part of the program. Reviewed heart healthy eating. She likes the routine she was doing  before her surgery and encouraged her to get back to that using mechanical hunger - eating outside of hunger since it is dulled.      Intervention Plan   Intervention Prescribe, educate and counsel regarding individualized specific dietary modifications aiming towards targeted core components such as weight, hypertension, lipid management, diabetes, heart failure and other comorbidities.;Nutrition handout(s) given to patient.    Expected Outcomes Short Term Goal: Understand basic principles of dietary content, such as calories, fat, sodium, cholesterol and nutrients.;Short Term Goal: A plan has been developed with personal nutrition goals set during dietitian appointment.;Long Term Goal: Adherence to prescribed nutrition plan.           Nutrition Assessments:  MEDIFICTS Score Key:  ?70 Need to make dietary changes   40-70 Heart Healthy Diet  ? 40 Therapeutic Level Cholesterol  Diet  Flowsheet Row Cardiac Rehab from 10/12/2020 in [facility] Cardiac and Pulmonary Rehab  Picture Your Plate Total Score on Admission 75  Picture Your Plate Total Score on Discharge 85     Picture Your Plate Scores:  <27 Unhealthy dietary pattern with much room for improvement.  41-50 Dietary pattern unlikely to meet recommendations for good health and room for improvement.  51-60 More healthful dietary pattern, with some room for improvement.   >60 Healthy dietary pattern, although there may be some specific behaviors that could be improved.    Nutrition Goals Re-Evaluation:  Nutrition Goals Re-Evaluation    Row Name 08/27/20 0752 09/19/20 0758 10/03/20 0809         Goals   Nutrition Goal Short: continue to monitor weight and protein Long:  reach goal weight -- Short: continue to monitor weight and protein. Try beans, peanut butter, fish for protein  Long:  reach goal weight     Comment [name] is eating a meat and vegetables at meals.  She says thye grilled lots o fmeat and vegetables yesterday to have for the week.  Her weight fluctuates alot.  She does send the RN a note if her weight is up and she usually takes an extra fluid pill.  She has increased protein intake. [name] says shes having a hard time getting meat down.  She doesnt like the texture of meat.  She will talk to RD about other ways to get protein. [name] reiterates that she does not care much for chicken or meat - reviewed other good sources of protein including beans, nuts/seeds - especially peanut butter, fish, eggs,  greek yogurt, cottage cheese. She would like to try beans (she likes black beans and pinto beans, fish, and peanut butter on her toast as well as some walnuts.     Expected Outcome -- -- Short: continue to monitor weight and protein. Try beans, peanut butter, fish for protein  Long:  reach goal weight            Nutrition Goals Discharge (Final Nutrition Goals Re-Evaluation):  Nutrition Goals Re-Evaluation  - 10/03/20 0809      Goals   Nutrition Goal Short: continue to monitor weight and protein. Try beans, peanut butter, fish for protein  Long:  reach goal weight    Comment [name] reiterates that she does not care much for chicken or meat - reviewed other good sources of protein including beans, nuts/seeds - especially peanut butter, fish, eggs,  greek yogurt, cottage cheese. She would like to try beans (she likes black beans and pinto beans, fish, and peanut butter on her toast as well as some walnuts.  Expected Outcome Short: continue to monitor weight and protein. Try beans, peanut butter, fish for protein  Long:  reach goal weight           Psychosocial: Target Goals: Acknowledge presence or absence of significant depression and/or stress, maximize coping skills, provide positive support system. Participant is able to verbalize types and ability to use techniques and skills needed for reducing stress and depression.   Education: Stress, Anxiety, and Depression - Group verbal and visual presentation to define topics covered.  Reviews how body is impacted by stress, anxiety, and depression.  Also discusses healthy ways to reduce stress and to treat/manage anxiety and depression.  Written material given at graduation. Flowsheet Row Cardiac Rehab from 10/17/2020 in [facility] Cardiac and Pulmonary Rehab  Education need identified 08/06/20  Date 09/26/20  Educator [facility]  Instruction Review Code 1- [employer] Understanding      Education: Sleep Hygiene -Provides group verbal and written instruction about how sleep can affect your health.  Define sleep hygiene, discuss sleep cycles and impact of sleep habits. Review good sleep hygiene tips.    Initial Review & Psychosocial Screening:  Initial Psych Review & Screening - 07/26/20 0902      Initial Review   Current issues with None Identified      Family Dynamics   Good Support System? Yes    Comments Patient has had a stroke last year and has not  done much since then. She can look to her husband, son and daughter for support. She has a somewhat positive outlook on her health.      Barriers   Psychosocial barriers to participate in program The patient should benefit from training in stress management and relaxation.;There are no identifiable barriers or psychosocial needs.      Screening Interventions   Interventions Encouraged to exercise;Provide feedback about the scores to participant;To provide support and resources with identified psychosocial needs           Quality of Life Scores:   Quality of Life - 10/12/20 0753      Quality of Life Scores   Health/Function Pre 20.57 %    Health/Function Post 29.6 %    Health/Function % Change 43.9 %    Socioeconomic Pre 29 %    Socioeconomic Post 28.29 %    Socioeconomic % Change  -2.45 %    Psych/Spiritual Pre 28.29 %    Psych/Spiritual Post 30 %    Psych/Spiritual % Change 6.04 %    Family Pre 30 %    Family Post 30 %    Family % Change 0 %    GLOBAL Pre 25.19 %    GLOBAL Post 29.47 %    GLOBAL % Change 16.99 %          Scores of 19 and below usually indicate a poorer quality of life in these areas.  A difference of  2-3 points is a clinically meaningful difference.  A difference of 2-3 points in the total score of the Quality of Life Index has been associated with significant improvement in overall quality of life, self-image, physical symptoms, and general health in studies assessing change in quality of life.  PHQ-9: Recent Review Flowsheet Data    Depression screen [facility] 2/9 09/03/2020 08/06/2020   Decreased Interest 0 0   Down, Depressed, Hopeless 0 0   PHQ - 2 Score 0 0   Altered sleeping 1 1   Tired, decreased energy 1 2   Change in appetite  0 1   Feeling bad or failure about yourself  0 0   Trouble concentrating 0 0   Moving slowly or fidgety/restless 0 1   Suicidal thoughts 0 0   PHQ-9 Score 2 5   Difficult doing work/chores Not difficult at all Not difficult  at all     Interpretation of Total Score  Total Score Depression Severity:  1-4 = Minimal depression, 5-9 = Mild depression, 10-14 = Moderate depression, 15-19 = Moderately severe depression, 20-27 = Severe depression   Psychosocial Evaluation and Intervention:  Psychosocial Evaluation - 07/26/20 0904      Psychosocial Evaluation & Interventions   Interventions Encouraged to exercise with the program and follow exercise prescription;Stress management education;Relaxation education    Comments Patient has had a stroke last year and has not done much since then. She can look to her husband, son and daughter for support. She has a somewhat positive outlook on her health.    Expected Outcomes Short: Exercise regularly to support mental health and notify staff of any changes. Long: maintain mental health and well being through teaching of rehab or prescribed medications independently.    Continue Psychosocial Services  Follow up required by staff           Psychosocial Re-Evaluation:  Psychosocial Re-Evaluation    [place] Name 08/27/20 0800 09/03/20 0740 09/19/20 0747         Psychosocial Re-Evaluation   Current issues with Current Stress Concerns;Current Sleep Concerns None Identified Current Sleep Concerns     Comments [name] reports no major stress.  Her family is a good support system.  She has some trouble sleeping - has a machine for sleep apnea. Reviewed patient health questionnaire (PHQ-9) with patient for follow up. Previously, patients score indicated signs/symptoms of depression.  Reviewed to see if patient is improving symptom wise while in program.  Score improved and patient states that it is because she has had more energy due to exercising more in rehab. [name] uses a sleep apnea machine that sometimes gives her trouble.  She does get back to sleep easily.  She reports no depression or anxiety.  She is able to do a lot more at home now.     Expected Outcomes Short: work on better  sleep patterns Long:  maintain positive outlook Short: Continue to attend [facility] regularly for regular exercise and social engagement. Long: Continue to improve symptoms and manage a positive mental state. Short:  continue to exercise regularly Long:  maintain positive outlook     Interventions -- Encouraged to attend Cardiac Rehabilitation for the exercise --     Continue Psychosocial Services  -- No Follow up required --            Psychosocial Discharge (Final Psychosocial Re-Evaluation):  Psychosocial Re-Evaluation - 09/19/20 0747      Psychosocial Re-Evaluation   Current issues with Current Sleep Concerns    Comments [name] uses a sleep apnea machine that sometimes gives her trouble.  She does get back to sleep easily.  She reports no depression or anxiety.  She is able to do a lot more at home now.    Expected Outcomes Short:  continue to exercise regularly Long:  maintain positive outlook           Vocational Rehabilitation: Provide vocational rehab assistance to qualifying candidates.   Vocational Rehab Evaluation & Intervention:  Vocational Rehab - 10/12/20 0754      Initial Vocational Rehab Evaluation & Intervention  Assessment shows need for Vocational Rehabilitation No           Education: Education Goals: Education classes will be provided on a variety of topics geared toward better understanding of heart health and risk factor modification. Participant will state understanding/return demonstration of topics presented as noted by education test scores.  Learning Barriers/Preferences:  Learning Barriers/Preferences - 07/26/20 0901      Learning Barriers/Preferences   Learning Barriers None    Learning Preferences None           General Cardiac Education Topics:  AED/CPR: - Group verbal and written instruction with the use of models to demonstrate the basic use of the AED with the basic ABC's of resuscitation.   Anatomy and Cardiac Procedures: -  Group verbal and visual presentation and models provide information about basic cardiac anatomy and function. Reviews the testing methods done to diagnose heart disease and the outcomes of the test results. Describes the treatment choices: Medical Management, Angioplasty, or Coronary Bypass Surgery for treating various heart conditions including Myocardial Infarction, Angina, Valve Disease, and Cardiac Arrhythmias.  Written material given at graduation. Flowsheet Row Cardiac Rehab from 10/17/2020 in [facility] Cardiac and Pulmonary Rehab  Education need identified 08/06/20  Date 10/17/20  Educator SB  Instruction Review Code 1- Verbalizes Understanding      Medication Safety: - Group verbal and visual instruction to review commonly prescribed medications for heart and lung disease. Reviews the medication, class of the drug, and side effects. Includes the steps to properly store meds and maintain the prescription regimen.  Written material given at graduation. Flowsheet Row Cardiac Rehab from 10/17/2020 in [facility] Cardiac and Pulmonary Rehab  Date 09/05/20  Educator SB  Instruction Review Code 1- Verbalizes Understanding      Intimacy: - Group verbal instruction through game format to discuss how heart and lung disease can affect sexual intimacy. Written material given at graduation.. Flowsheet Row Cardiac Rehab from 10/17/2020 in [facility] Cardiac and Pulmonary Rehab  Date 08/08/20  Educator [facility]  Instruction Review Code 1- Verbalizes Understanding      Know Your Numbers and Heart Failure: - Group verbal and visual instruction to discuss disease risk factors for cardiac and pulmonary disease and treatment options.  Reviews associated critical values for Overweight/Obesity, Hypertension, Cholesterol, and Diabetes.  Discusses basics of heart failure: signs/symptoms and treatments.  Introduces Heart Failure Zone chart for action plan for heart failure.  Written material given at graduation. Flowsheet Row  Cardiac Rehab from 10/17/2020 in [facility] Cardiac and Pulmonary Rehab  Date 09/12/20  Educator SB  Instruction Review Code 1- Verbalizes Understanding      Infection Prevention: - Provides verbal and written material to individual with discussion of infection control including proper hand washing and proper equipment cleaning during exercise session. Flowsheet Row Cardiac Rehab from 10/17/2020 in [facility] Cardiac and Pulmonary Rehab  Date 07/26/20  Educator [facility]  Instruction Review Code 1- Verbalizes Understanding      Falls Prevention: - Provides verbal and written material to individual with discussion of falls prevention and safety. Flowsheet Row Cardiac Rehab from 10/17/2020 in [facility] Cardiac and Pulmonary Rehab  Date 07/26/20  Educator [facility]  Instruction Review Code 1- Verbalizes Understanding      Other: -Provides group and verbal instruction on various topics (see comments)   Knowledge Questionnaire Score:  Knowledge Questionnaire Score - 10/12/20 0800      Knowledge Questionnaire Score   Pre Score 22/26 Education Focus: Depression, Angina, and Nutrition  Post Score 26/26           Core Components/Risk Factors/Patient Goals at Admission:  Personal Goals and Risk Factors at Admission - 08/06/20 1423      Core Components/Risk Factors/Patient Goals on Admission    Weight Management Yes;Weight Loss;Obesity    Intervention Weight Management: Develop a combined nutrition and exercise program designed to reach desired caloric intake, while maintaining appropriate intake of nutrient and fiber, sodium and fats, and appropriate energy expenditure required for the weight goal.;Weight Management: Provide education and appropriate resources to help participant work on and attain dietary goals.;Weight Management/Obesity: Establish reasonable short term and long term weight goals.;Obesity: Provide education and appropriate resources to help participant work on and attain dietary goals.    Admit  Weight 240 lb (108.9 kg)    Goal Weight: Short Term 235 lb (106.6 kg)    Goal Weight: Long Term 230 lb (104.3 kg)    Expected Outcomes Short Term: Continue to assess and modify interventions until short term weight is achieved;Long Term: Adherence to nutrition and physical activity/exercise program aimed toward attainment of established weight goal;Weight Loss: Understanding of general recommendations for a balanced deficit meal plan, which promotes 1-2 lb weight loss per week and includes a negative energy balance of [phone] kcal/d;Understanding recommendations for meals to include 15-35% energy as protein, 25-35% energy from fat, 35-60% energy from carbohydrates, less than 266m of dietary cholesterol, 20-35 gm of total fiber daily;Understanding of distribution of calorie intake throughout the day with the consumption of 4-5 meals/snacks    Heart Failure Yes    Intervention Provide a combined exercise and nutrition program that is supplemented with education, support and counseling about heart failure. Directed toward relieving symptoms such as shortness of breath, decreased exercise tolerance, and extremity edema.    Expected Outcomes Improve functional capacity of life;Short term: Attendance in program 2-3 days a week with increased exercise capacity. Reported lower sodium intake. Reported increased fruit and vegetable intake. Reports medication compliance.;Short term: Daily weights obtained and reported for increase. Utilizing diuretic protocols set by physician.;Long term: Adoption of self-care skills and reduction of barriers for early signs and symptoms recognition and intervention leading to self-care maintenance.    Hypertension Yes    Intervention Monitor prescription use compliance.;Provide education on lifestyle modifcations including regular physical activity/exercise, weight management, moderate sodium restriction and increased consumption of fresh fruit, vegetables, and low fat dairy,  alcohol moderation, and smoking cessation.    Expected Outcomes Short Term: Continued assessment and intervention until BP is < 140/961mHG in hypertensive participants. < 130/8081mG in hypertensive participants with diabetes, heart failure or chronic kidney disease.;Long Term: Maintenance of blood pressure at goal levels.    Lipids Yes    Intervention Provide education and support for participant on nutrition & aerobic/resistive exercise along with prescribed medications to achieve LDL <15m15mDL >40mg30m Expected Outcomes Short Term: Participant states understanding of desired cholesterol values and is compliant with medications prescribed. Participant is following exercise prescription and nutrition guidelines.;Long Term: Cholesterol controlled with medications as prescribed, with individualized exercise RX and with personalized nutrition plan. Value goals: LDL < 15mg,64m > 40 mg.           Education:Diabetes - Individual verbal and written instruction to review signs/symptoms of diabetes, desired ranges of glucose level fasting, after meals and with exercise. Acknowledge that pre and post exercise glucose checks will be done for 3 sessions at entry of program.   Core Components/Risk Factors/Patient  Goals Review:   Goals and Risk Factor Review    Row Name 08/27/20 0746 09/19/20 0741           Core Components/Risk Factors/Patient Goals Review   Personal Goals Review Weight Management/Obesity;Heart Failure;Lipids Weight Management/Obesity;Heart Failure;Lipids      Review [name] is weighing herself daily.  She is hypotensive (BP 102/62 today)  She has a nurse who helps her to manage her fluid levels.  She is taking all medications as directed. [name] continues to weigh herself daily.  She had fluid levels checked yesterday and has protocols if she gets fluid overload.  She can tell if she has extra fluid.  Her last cholesterol check was March and she thinks it was in good range.  She takes  meds as directed.      Expected Outcomes Short: continue to monitor weight and BPLong: manage risk factors long term Short: continue to manage HF symptoms  Long : control HF long term             Core Components/Risk Factors/Patient Goals at Discharge (Final Review):   Goals and Risk Factor Review - 09/19/20 0741      Core Components/Risk Factors/Patient Goals Review   Personal Goals Review Weight Management/Obesity;Heart Failure;Lipids    Review [name] continues to weigh herself daily.  She had fluid levels checked yesterday and has protocols if she gets fluid overload.  She can tell if she has extra fluid.  Her last cholesterol check was March and she thinks it was in good range.  She takes meds as directed.    Expected Outcomes Short: continue to manage HF symptoms  Long : control HF long term           ITP Comments:  ITP Comments    Row Name 07/26/20 0906 08/06/20 1408 08/08/20 0728 08/14/20 0838 08/15/20 0600   ITP Comments Virtual Visit completed. Patient informed on EP and RD appointment and 6 Minute walk test. Patient also informed of patient health questionnaires on My Chart. Patient Verbalizes understanding. Visit diagnosis can be found in [facility] 07/23/2020. Completed 6MWT and gym orientation. Initial ITP created and sent for review to Dr. Emily Filbert, Medical Director. First full day of exercise!  Patient was oriented to gym and equipment including functions, settings, policies, and procedures.  Patient's individual exercise prescription and treatment plan were reviewed.  All starting workloads were established based on the results of the 6 minute walk test done at initial orientation visit.  The plan for exercise progression was also introduced and progression will be customized based on patient's performance and goals. 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  Call into doctor and change in medication therapy. 30 Day review completed. Medical Director ITP  review done, changes made as directed, and signed approval by Medical Director.   [place] Name 08/17/20 0848 09/12/20 0704 10/10/20 0858 10/17/20 0828     ITP Comments Cleared to return to rehab.  On new medications.  Will return on Monday 08/21/19 30 Day review completed. Medical Director ITP review done, changes made as directed, and signed approval by Medical Director. 30 Day review completed. Medical Director ITP review done, changes made as directed, and signed approval by Medical Director. [name] graduated today from  rehab with 36 sessions completed.  Details of the patient's exercise prescription and what She needs to do in order to continue the prescription and progress were discussed with patient.  Patient was given a  copy of prescription and goals.  Patient verbalized understanding.  [name] plans to continue to exercise by walking at home, using staff videos, and riding the bike.           Comments: discharge ITP

## 2020-10-22 ENCOUNTER — Ambulatory Visit (INDEPENDENT_AMBULATORY_CARE_PROVIDER_SITE_OTHER): Payer: BC Managed Care – PPO

## 2020-10-22 DIAGNOSIS — I5022 Chronic systolic (congestive) heart failure: Secondary | ICD-10-CM

## 2020-10-22 DIAGNOSIS — Z95810 Presence of automatic (implantable) cardiac defibrillator: Secondary | ICD-10-CM | POA: Diagnosis not present

## 2020-10-23 ENCOUNTER — Ambulatory Visit (HOSPITAL_COMMUNITY)
Admission: RE | Admit: 2020-10-23 | Discharge: 2020-10-23 | Disposition: A | Discharge status: Home / Self Care | Payer: BC Managed Care – PPO | Source: Ambulatory Visit | Attending: Family Medicine | Admitting: Family Medicine

## 2020-10-23 ENCOUNTER — Other Ambulatory Visit: Payer: Self-pay

## 2020-10-23 ENCOUNTER — Ambulatory Visit (HOSPITAL_BASED_OUTPATIENT_CLINIC_OR_DEPARTMENT_OTHER)
Admission: RE | Admit: 2020-10-23 | Discharge: 2020-10-23 | Disposition: A | Discharge status: Home / Self Care | Payer: BC Managed Care – PPO | Source: Ambulatory Visit | Attending: Cardiology | Admitting: Cardiology

## 2020-10-23 ENCOUNTER — Encounter (HOSPITAL_COMMUNITY): Payer: Self-pay | Admitting: Cardiology

## 2020-10-23 VITALS — BP 108/70 | HR 70 | Wt 242.6 lb

## 2020-10-23 DIAGNOSIS — I5022 Chronic systolic (congestive) heart failure: Secondary | ICD-10-CM

## 2020-10-23 DIAGNOSIS — I428 Other cardiomyopathies: Secondary | ICD-10-CM | POA: Insufficient documentation

## 2020-10-23 DIAGNOSIS — Z95810 Presence of automatic (implantable) cardiac defibrillator: Secondary | ICD-10-CM | POA: Diagnosis not present

## 2020-10-23 DIAGNOSIS — I110 Hypertensive heart disease with heart failure: Secondary | ICD-10-CM | POA: Diagnosis not present

## 2020-10-23 DIAGNOSIS — Z8673 Personal history of transient ischemic attack (TIA), and cerebral infarction without residual deficits: Secondary | ICD-10-CM | POA: Diagnosis not present

## 2020-10-23 DIAGNOSIS — I340 Nonrheumatic mitral (valve) insufficiency: Secondary | ICD-10-CM | POA: Diagnosis not present

## 2020-10-23 DIAGNOSIS — I252 Old myocardial infarction: Secondary | ICD-10-CM | POA: Diagnosis not present

## 2020-10-23 DIAGNOSIS — Z7902 Long term (current) use of antithrombotics/antiplatelets: Secondary | ICD-10-CM | POA: Diagnosis not present

## 2020-10-23 DIAGNOSIS — Z79899 Other long term (current) drug therapy: Secondary | ICD-10-CM | POA: Insufficient documentation

## 2020-10-23 DIAGNOSIS — I472 Ventricular tachycardia, unspecified: Secondary | ICD-10-CM

## 2020-10-23 DIAGNOSIS — Z6841 Body Mass Index (BMI) 40.0 and over, adult: Secondary | ICD-10-CM | POA: Insufficient documentation

## 2020-10-23 DIAGNOSIS — Z8249 Family history of ischemic heart disease and other diseases of the circulatory system: Secondary | ICD-10-CM | POA: Diagnosis not present

## 2020-10-23 DIAGNOSIS — I272 Pulmonary hypertension, unspecified: Secondary | ICD-10-CM | POA: Insufficient documentation

## 2020-10-23 DIAGNOSIS — Z7984 Long term (current) use of oral hypoglycemic drugs: Secondary | ICD-10-CM | POA: Insufficient documentation

## 2020-10-23 DIAGNOSIS — E785 Hyperlipidemia, unspecified: Secondary | ICD-10-CM | POA: Insufficient documentation

## 2020-10-23 DIAGNOSIS — I447 Left bundle-branch block, unspecified: Secondary | ICD-10-CM | POA: Diagnosis not present

## 2020-10-23 DIAGNOSIS — Z7982 Long term (current) use of aspirin: Secondary | ICD-10-CM | POA: Diagnosis not present

## 2020-10-23 DIAGNOSIS — Z006 Encounter for examination for normal comparison and control in clinical research program: Secondary | ICD-10-CM

## 2020-10-23 LAB — ECHOCARDIOGRAM COMPLETE
AR max vel: 1.6 cm2
AV Area VTI: 1.52 cm2
AV Area mean vel: 1.56 cm2
AV Mean grad: 6 mmHg
AV Peak grad: 11.7 mmHg
Ao pk vel: 1.71 m/s
Calc EF: 33.1 %
MV M vel: 4.52 m/s
MV Peak grad: 81.7 mmHg
Radius: 0.4 cm
S' Lateral: 6 cm
Single Plane A2C EF: 40.1 %
Single Plane A4C EF: 22.8 %

## 2020-10-23 LAB — BASIC METABOLIC PANEL
Anion gap: 7 (ref 5–15)
BUN: 17 mg/dL (ref 6–20)
CO2: 25 mmol/L (ref 22–32)
Calcium: 9 mg/dL (ref 8.9–10.3)
Chloride: 103 mmol/L (ref 98–111)
Creatinine, Ser: 1.36 mg/dL — ABNORMAL HIGH (ref 0.44–1.00)
GFR, Estimated: 48 mL/min — ABNORMAL LOW (ref >60)
Glucose, Bld: 88 mg/dL (ref 70–99)
Potassium: 4.4 mmol/L (ref 3.5–5.1)
Sodium: 135 mmol/L (ref 135–145)

## 2020-10-23 MED ORDER — TORSEMIDE 20 MG PO TABS: ORAL_TABLET | ORAL | 11 refills | Status: DC

## 2020-10-23 NOTE — Patient Instructions (Signed)
INCREASE Torsemide to 80mg  (4 tabs) in the morning and 40mg  (2 tabs) every afternoon for 3 days THEN start 80mg  in the morning and 40mg  EVERY OTHER AFTERNOON   Labs today and repeat in 10 days We will only contact you if something comes back abnormal or we need to make some changes. Otherwise no news is good news!  Your physician recommends that you schedule a follow-up appointment in: 3 weeks with the Nurse practitioner/Physician Assistant.   Please call office at [phone] option 2 if you have any questions or concerns.   At the [facility], you and your health needs are our priority. As part of our continuing mission to provide you with exceptional heart care, we have created designated Provider Care Teams. These Care Teams include your primary Cardiologist (physician) and Advanced Practice Providers (APPs- Physician Assistants and Nurse Practitioners) who all work together to provide you with the care you need, when you need it.   You may see any of the following providers on your designated Care Team at your next follow up: Marland Kitchen Dr Glori Bickers . Dr [name] . Dr Vickki Muff . Darrick Grinder, NP . Lyda Jester, [place] . Audry Riles, PharmD   Please be sure to bring in all your medications bottles to every appointment.

## 2020-10-23 NOTE — Progress Notes (Signed)
ReDS Vest / Clip - 10/23/20 0921      ReDS Vest / Clip   Station Marker A    Ruler Value 28    ReDS Value Range High volume overload    ReDS Actual Value 42

## 2020-10-23 NOTE — Progress Notes (Signed)
ID:  [name], DOB Apr 10, 1971, MRN [id number]    Provider location: [place] [facility] Type of Visit: Established patient   PCP:  [name], MD    Cardiologist:  [name], MD  History of Present Illness: [name] is a 50 y.o. 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  She was followed for systolic CHF initially in [place]. Apparently her EF recovered to 35-40%. However, she was admitted to [facility] 09/19/13 for worsening dyspnea and CP. Coronaries were reportedly OK on LHC at that time at [facility]. Echo showed EF of 15-20% with four-chamber enlargement with moderate-severe TR/MR. RHC as below. Rheumatological w/u negative.  CPX in 9/16 showed near-normal functional capacity.  Repeat echo in 6/17 shows that EF remains 20%. Repeat CPX in 1/18 was submaximal but probably only mild circulatory limitation.  Echo 8/18 showed EF 25-30%, severe LV dilation.  CPX in 6/19 showed low normal functional capacity.  Echo in 10/19 showed EF 20-25%, moderate LV dilation.   Zio patch in 2/20 with 11% PVCs and NSVT, amiodarone started. Zio patch in 3/20 with 13.3% PVCs and NSVT.  She saw Dr. [name], he thought that increased PVCs were electromechanical in nature due to worsening heart failure.  Repeat Zio patch on amiodarone in 1/21 showed 6.2% PVCs.   She was admitted in 4/20 with VT in setting of hypokalemia.    CPX in 9/20 showed mild functional limitation, more due to body habitus than heart failure.  Echo in 4/21 showed EF 20% with severe LV dilation, diffuse hypokinesis, normal RV size and systolic function.   In 8/21, she had a shock for VF in the setting of hypokalemia.   Patient was admitted in 10/21 with CVA involving left MCA territory.  This was thought to be cardioembolic but LV thrombus was not visualized and no atrial fibrillation has been seen.  Echo  in 10/21 showed EF 20-25%, severe LV dilation (no LV thrombus seen), mildly decreased RV systolic function, mild-moderate MR. She was taken by IR for thrombectomy and stent placement, now on ASA 81 and ticagrelor.   She had recurrent VT in 2/22, quinidine was added by EP.  Spironolactone and Coreg were decreased due to low BP and lightheadedness.    Echo was done today and reviewed, EF 25% with severe LV dilation and global hypokinesis, normal RV, mild-moderate MR.   She returns for followup of CHF.  She has finished cardiac rehab.  She feels like this helped her a lot.  She can walk on the treadmill for 20-25 minutes now without dyspnea.  She has developed orthopnea, however, over the last couple weeks.  No lightheadedness.  She feels like her abdomen is bloating.  No palpitations.  Weight is up 9 lbs.  She is using CPAP.   Medtronic device interrogation: Fluid index > threshold  REDS clip 42%.   ECG (4/22, personally reviewed): NSR, LBBB 140 msec  PMH: 1. OSA: Using CPAP.  2. PVCs:  - Zio patch 2/20: 11% PVCs - Zio patch 3/20: 13.3% PVCs - Zio patch 1/21: 6.2% PVCs 3. VT/VF: 8/21, in setting of hypokalemia.  2/22 VT, quinidine added.  4. 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  No LV thrombus visualized and atrial fibrillation has not been visualized.  - IR thrombectomy with stent placement in 10/21.  5. Chronic systolic CHF: Nonischemic cardiomyopathy:   -  Coronary angiography 2015 at [facility]: Normal coronaries.  - Echo (8/15) with EF 20%, moderate LV dilation, diffuse hypokinesis, normal RV size with mildly decreased systolic function, mild MR.  - Echo (5/16) with EF 20%, spherical LV with diffuse hypokinesis, mild MR.  - Echo (6/17) with EF 20%, diffuse hypokinesis, moderate LV dilation, normal RV, mild to moderate MR.  - RHC (12/17): mean RA 5, PA 56/31, mean PCWP 24, CI 2, PVR 3.7 WU - Echo (8/18) with EF 25-30%, severe LV dilation, severe LAE.  - Echo (2/20) with EF 20-25%, moderate LV  dilation, moderate MR.  - Echo (4/21) with EF 20-25%, severe LV dilation, mild-moderate MR, normal RV, PASP 26 mmHg.  - Echo (10/21) with EF 20-25%, severe LV dilation (no LV thrombus seen), mildly decreased RV systolic function, mild-moderate MR. - CPX in 9/20 showed mild functional limitation, more due to body habitus than heart failure.  - CPX in 8/21: Peak VO2 11.6, VE/VCO2 32, RER 1.2.  Mild HF limitation.  - Echo (4/22): EF 25% with severe LV dilation and global hypokinesis, normal RV, mild-moderate MR.  6. PFTs (1/22): Near normal.   Labs: (11/09/13): Dig level 0.5, K 3.6, Creatinine 0.83, pro-BNP 622 (12/19/13): K 3.6, creatinine 0.8 (7/15): K 4.1, creatinine 0.91 (8/15): K 3.9, creatinine 0.91 (10/15): K 4, creatinine 0.93, proBNP 129 (12/15): K 4, creatinine 0.9, digoxin 0.4 (5/16): K 4, creatinine 0.86 (9/16): K 3.6, creatinine 0.86, digoxin < 0.2 (5/17): K 4.1, creatinine 0.95, digoxin 0.3 (5/18): K 3.4, creatinine 0.95, TSH elevated but free T3 and free T4 normal.  (8/18): K 3.9, creatinine 0.87, BNP 108  (11/18): K 3.5, creatinine 1.04, digoxin 0.4 (1/19): LDL 91, HDL 47 (3/19): digoxin 0.7, K 4, creatinine 0.99 (4/19): TSH normal, LDL 95 (9/19): LDL 99, HDL 45, K 4, creatinine 0.96 (10/19): K 3.6, creatinine 0.85, digoxin 0.5 (2/20): LDL 103 (3/20): K 3.9, creatinine 1.05 (4/20): K 3.9, creatinine 1.23 => 1.15, TSH 8, digoxin 0.3, LFTs normal 6/20): K 4.3, creatinine 1.07, LFTs normal, digoxin 0.5 (7/20): K 3.8, creatinine 1.16 (8/20): digoxin < 0.2, LDL 135 (1/21): K 3.9, creatinine 1.05, LDL 171  (4/21): K 4.2, creatinine 0.95, LDL 127, TSH normal, LFTs normal (10/21): K 2.8, creatinine 1.21, LDL 130 (11/21): K 4.5, creatinine 1.3 => 1.2, LFTs normal, digoxin 1.3, TSH normal (3/22): K 4.1, creatinine 1.36 (4/22): hgb 10.8  Current Outpatient Medications  Medication Sig Dispense Refill  . amiodarone (PACERONE) 200 MG tablet Take 1.5 tablets (300 mg) by mouth  once daily    . aspirin 81 MG chewable tablet Chew 1 tablet (81 mg total) by mouth daily. 30 tablet 11  . atorvastatin (LIPITOR) 40 MG tablet Take 40 mg by mouth daily.    . carvedilol (COREG) 3.125 MG tablet Take 3.125 mg by mouth 2 (two) times daily with a meal.    . digoxin (LANOXIN) 0.125 MG tablet Take 1 tablet (0.125 mg) by mouth once every other day    . FARXIGA 10 MG TABS tablet TAKE 1 TABLET BY MOUTH ONCE A DAY BEFOREBREAKFAST 30 tablet 5  . levothyroxine (SYNTHROID) 25 MCG tablet Take 1 tablet (25 mcg total) by mouth daily before breakfast. 90 tablet 3  . metolazone (ZAROXOLYN) 2.5 MG tablet Take 1 tablet (2.5 mg total) by mouth as needed (extra fluid as directed by HF clinic). 5 tablet 3  . potassium chloride SA (KLOR-CON) 20 MEQ tablet Take 40 mEq by mouth 2 (two) times daily.    Marland Kitchen  quiniDINE gluconate 324 MG CR tablet Take 1 tablet (324 mg total) by mouth 2 (two) times daily. 60 tablet 6  . sacubitril-valsartan (ENTRESTO) 24-26 MG Take 1 tablet by mouth 2 (two) times daily. 60 tablet 3  . spironolactone (ALDACTONE) 25 MG tablet Take 0.5 tablet (12.5 mg) by mouth once daily     . ticagrelor (BRILINTA) 90 MG TABS tablet Take 1 tablet (90 mg total) by mouth 2 (two) times daily. 60 tablet 11  . torsemide (DEMADEX) 20 MG tablet Take 80mg  (4 tabs) every morning AND 40mg  (2 tabs) every other afternoon 200 tablet 11   No current facility-administered medications for this encounter.    Allergies:   Patient has no known allergies.   Social History:  The patient  reports that she has never smoked. She has never used smokeless tobacco. She reports that she does not drink alcohol and does not use drugs.   Family History:  The patient's family history includes Heart disease in her maternal grandmother and mother; High blood pressure in her father; Obesity in her mother; Stroke in her father.   ROS:  Please see the history of present illness.   All other systems are personally reviewed and  negative.   Exam:   BP 108/70   Pulse 70   Wt 110 kg (242 lb 9.6 oz)   SpO2 99%   BMI 41.51 kg/m  General: NAD Neck: JVP 8-9 cm, no thyromegaly or thyroid nodule.  Lungs: Clear to auscultation bilaterally with normal respiratory effort. CV: Lateral PMI.  Heart regular S1/S2, no S3/S4, no murmur.  No peripheral edema.  No carotid bruit.  Normal pedal pulses.  Abdomen: Soft, nontender, no hepatosplenomegaly, mild distention.  Skin: Intact without lesions or rashes.  Neurologic: Alert and oriented x 3.  Psych: Normal affect. Extremities: No clubbing or cyanosis.  HEENT: Normal.   Recent Labs: 01/26/2020: B Natriuretic Peptide 929.8 04/20/2020: Magnesium 1.9 07/23/2020: NT-Pro BNP 312 08/07/2020: TSH 4.066 08/21/2020: ALT 14 09/25/2020: Hemoglobin 10.8; Platelets 383 10/23/2020: BUN 17; Creatinine, Ser 1.36; Potassium 4.4; Sodium 135  Personally reviewed   Wt Readings from Last 3 Encounters:  10/23/20 110 kg (242 lb 9.6 oz)  10/08/20 110 kg (242 lb 6.4 oz)  09/25/20 108 kg (238 lb)      ASSESSMENT AND PLAN:  1. Chronic systolic HF: Nonischemic cardiomyopathy.  She has a Medtronic ICD. Cause for cardiomyopathy is uncertain, initially identified peri-partum (after son's birth), so cannot rule out peri-partum CMP.  On and off, she has had frequent PVCs.   RHC in 8/15 showed normal filling pressures and preserved cardiac output.  She does not qualify for CRT with relatively narrow QRS.  CPX 9/20 with mildly decreased functional capacity due to body habitus and HF.  CPX in 8/21 also showed mild functional limitation.  Echo in 10/21 showed stable EF 20-25% with severe LV dilation, mildly decreased RV systolic function, and mild-moderate MR. Echo in 4/22 with EF 25%, severe LV dilation.  REDS clip is elevated and weight is up, she is volume overloaded on exam.  NYHA class II symptoms.   - Increase torsemide to 80 qam/40 qpm x 3 days then 80 qam, 40 every other pm after that.  BMET today and in 10  days.     - Continue Entresto 24/26 bid, did not tolerate higher dose due to orthostatic symptoms and syncope.  - She is on a lower Coreg dose 3.125 mg bid due to orthostasis.    -  Increase spironolactone back to 25 mg but take qhs.   - She is on digoxin 0.0625.  Check level today.   - Continue dapagliflozin 10 mg daily.  - EF remains low, if she worsens clinically would be candidate for LVAD.  BMI too high currently for transplant.   - Repeat CPX later this year.  - I had her evaluated for baroreceptor activation therapy (BATwire).  I think she would be a good candidate => she will discuss with research today to get set up for placement.  2. Pulmonary HTN: Mixed picture with PVR 8.1 on RHC at [facility].  However, repeat study in 8/15 here showed no significant pulmonary arterial hypertension.  2017 RHC with pulmonary venous hypertension.  3. Morbid obesity: Continue to work on weight loss.  She is going to work aggressively on diet and increase her exercise level.  4. Hyperlipidemia: Continue atorvastatin.  5. PVCs: 11% PVCs + NSVT on 2/20 Zio patch.  Amiodarone started, but PVCs up to 13.3% with NSVT on 3/20 Zio patch. She saw Dr. [name] who thought that the ventricular arrhythmias were electromechanical due to worsening HF.  In 4/20, she had VT x 2 with ICD discharges in the setting of hypokalemia. Zio patch in 1/21 with PVCs down to 6.2% of beats. VT again in 2/22, quinidine started.  - Continue amiodarone, check LFTs and TSH.  She will need a regular eye exam with amiodarone use.  - Continue quinidine per EP.  6. CVA: CVA in 10/21, had thrombectomy with stent placement.  No LV thrombus noted and no atrial fibrillation noted.  - To continue ticagrelor at least until repeat head CT in 7/22.   Recommended follow-up:  APP in 3 wks to reassess volume.   Signed, [name], MD  10/23/2020  [facility] [address] [facility] and [place]  [address] [phone] (office) [phone] (fax)

## 2020-10-24 ENCOUNTER — Other Ambulatory Visit (HOSPITAL_COMMUNITY): Payer: Self-pay | Admitting: Cardiology

## 2020-10-24 NOTE — Progress Notes (Signed)
EPIC Encounter for ICM Monitoring  Patient Name: [name] is a 50 y.o. female Date: 10/24/2020 Primary Care Physican: Patient, No Pcp Per (Inactive) Primary Cardiologist:[place] Electrophysiologist:Klein 10/23/2020 Office Weight: 242lbs  Spoke with patient and reports feeling bloating of belly.    OptivolThoracicimpedancenormal but was suggesting possible fluid accumulation from 4/3-5/1.  Prescribed:  Torsemide20 mgTake4tablets(80 mg total)by mouth everymorning and 2 tablets (40 mg total) every other afternoon.   Potassium 20 mEqTake2tablets (40 mEq total) by mouthtwice a day  Spironolactone 25 mg take 0.5 tablet(12.5 mg total) daily.   Farxiga 10 mg take 1 tablet daily.  Metolazone 2.5 mgTake 2.5 mg by mouth as needed (extra fluid as directed by HF clinic).  Labs: BMET scheduled 11/13/2020 10/23/2020 Creatinine 1.36, BUN 17, Potassium 4.4, Sodium 135, GFR 48 08/21/2020 Creatinine 1.36, BUN 17, Potassium 4.1, Sodium 136, GFR 48 08/07/2020 Creatinine 1.15, BUN 16, Potassium 3.9, Sodium 138, GFR 58 07/23/2020 Creatinine 1.67, BUN 18, Potassium 3.0, Sodium 134, GFR 37 A complete set of results can be found in Results Review.  Recommendations:Patient said Dr [name] increased Torsemide at 5/3 OV with Dr [name].  Follow-up plan: ICM clinic phone appointment on5/03/2021 to recheck fluid levels. 91 day device clinic remote transmission6/27/2022.   EP/Cardiology Office Visits:5/24/2022with HF Clinic.12/25/2020 with Dr [name]  Copy of ICM check sent to [place]   3 month ICM trend: 10/22/2020.    1 Year ICM trend:       [name], RN 10/24/2020 9:40 AM

## 2020-10-29 ENCOUNTER — Other Ambulatory Visit: Payer: Self-pay

## 2020-10-29 ENCOUNTER — Ambulatory Visit (INDEPENDENT_AMBULATORY_CARE_PROVIDER_SITE_OTHER): Payer: BC Managed Care – PPO | Admitting: Adult Health

## 2020-10-29 ENCOUNTER — Encounter: Payer: Self-pay | Admitting: Adult Health

## 2020-10-29 VITALS — BP 95/66 | HR 79 | Ht 64.0 in | Wt 243.0 lb

## 2020-10-29 DIAGNOSIS — I63312 Cerebral infarction due to thrombosis of left middle cerebral artery: Secondary | ICD-10-CM | POA: Diagnosis not present

## 2020-10-29 DIAGNOSIS — I10 Essential (primary) hypertension: Secondary | ICD-10-CM

## 2020-10-29 DIAGNOSIS — E785 Hyperlipidemia, unspecified: Secondary | ICD-10-CM | POA: Diagnosis not present

## 2020-10-29 DIAGNOSIS — R209 Unspecified disturbances of skin sensation: Secondary | ICD-10-CM

## 2020-10-29 DIAGNOSIS — I69398 Other sequelae of cerebral infarction: Secondary | ICD-10-CM

## 2020-10-29 DIAGNOSIS — Z9989 Dependence on other enabling machines and devices: Secondary | ICD-10-CM

## 2020-10-29 DIAGNOSIS — G4733 Obstructive sleep apnea (adult) (pediatric): Secondary | ICD-10-CM

## 2020-10-29 NOTE — Progress Notes (Signed)
[facility] [address] [place]. [place] [address] [phone]       STROKE FOLLOW UP NOTE  [name] Date of Birth:  March 31, 1971 Medical Record Number:  [id number]   Reason for Referral: stroke follow up    SUBJECTIVE:   CHIEF COMPLAINT:  Chief Complaint  Patient presents with  . Follow-up    Rm 14 with spouse ([name]) Pt still has a lot of tingling on R aside and reaction time is slow.     HPI:   Today, 10/29/2020, [name] returns for 25-month stroke follow-up accompanied by her husband, [name]  Reports residual right leg numbness/tingling with occasional painful sensation at nighttime which at times can interfere with her sleep.  Mild decreased right hand dexterity and slight numbness gradually improving.  She has been taking Tylenol PM with increased pain as well as massage and use of compression with benefit. Denies new stroke/TIA symptoms  Remains on aspirin and Brilinta as well as atorvastatin without associated side effects Blood pressure today 95/66 -routinely monitors at home and typically runs on lower side Use of CPAP nightly for OSA management   Has since had follow-up with cardiology with recent medication adjustments as well as IR with stable CTA with plans on repeating around September and per pt, IR advised ongoing use of Brilinta until that time  No further concerns at this time   History provided for reference purposes only Initial visit 07/31/2020 JM: [name] is being seen for hospital follow-up from admission in 03/2020 accompanied by her husband.  She reports residual decreased right hand dexterity and tingling right hand and foot.  At discharge, she initially had diminished sensation on right side but this has been slowly improving.  More recently, she will experience pins-and-needles sensation more at nighttime. She will use a compression sock with benefit.   She has not participated in any therapies as she reports being told  that therapy was not needed.   Denies new or worsening stroke/TIA symptoms.  She has remained on aspirin and Brilinta without bleeding or bruising.  Remains on atorvastatin 40 mg daily without myalgias.  Blood pressure today 106/71.  Reports 1 syncopal event which sounds to be hypotension related and has not experienced any additional events since cardiology lowered Entresto dosage.  Reports compliance with CPAP for sleep apnea management.  She did have follow-up with Dr. Estanislado Pandy who plans on repeating CTA around this time -she reports calling office and leaving voicemail to schedule imaging 1-2 weeks ago but has not received return call back to schedule.  No further concerns at this time.  Stroke admission 04/17/2020 [name] a 51 y.o.femalewith a history of CHF with an EF of 15 to 20% who presented to [facility] ED on 04/17/2020 with right-sided weakness.  Personally reviewed hospitalization pertinent progress notes, lab work and imaging with summary provided.  Evaluated by Dr. Leonie Man with CT showing subtle hypodensity of the left [place] and evidence of left M1 occlusion s/p mechanical thrombectomy and stent.  Repeat CTA showed stent with nonocclusive thrombus along the posterior wall mid stent.  Unable to obtain MRI d/t ICD.  2D echo showed EF 20 to 25%.  ICD interrogated which did not show evidence of atrial fibrillation.  She was placed on Brilinta 90 mg twice daily and aspirin 81 mg daily for secondary stroke prevention.  She was advised to follow-up outpatient with Dr. Estanislado Pandy. 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  History of HLD on atorvastatin 40 mg daily with LDL 127.  No prior history or  evidence of DM with A1c 5.3.  Other stroke risk factors include hx of substance abuse, obesity, family history of stroke, CAD/MI, OSA on CPAP and systolic CHF.  Evaluated by therapies and recommended discharge home with OP PT/OT.    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   CT perfusion with 64 cc delay in left MCA territory and 0cc CBF and collateral flow to left MCA branches. Patient went for intervention with thrombectomy and stent placement of left M1 occlusion.     Post-stent CT 10/27 shows continued mild hypodensity of left putamen without other loss of grey-white differentiation or hemorrhage with stent at M1/M2 left MCA  CTA 10/28 with left MCA stent with nonocclusive thrombus along the posterior wall of the mid stent.   Goal bp systolic [id number] from neurological standpoint.   Unable to obtain MRI/MRA due to ICD   2D Echo without thrombus, EF 20-25% and global hypokinesis, severely dilated LV w/grade III diastolic dysfunction. RVP 38 mmHg, decreased IVC respiratory variability, EF similar to prior 09/27/19  ICD negative for atrial fibrillation. Continue brillinta 90 mg bid and asa 81 mg qd.     LDL 127  HgbA1c 5.3  VTE prophylaxis - lovenox  Continue PT/OT   Therapy recommendations:  OP PT/OT/SLP  Disposition: Stable for discharge from neurological standpoint pending PT/OT evaluation today for CIR vs. HHPT. With EF 20-25% without thrombus, ICD negative for atrial fibrillation. Recommend continuing brilinta 90 mg bid and asa 81 mg. Will possibly switch to warfarin in the future per cardiology with concern for thrombus being an ongoing concern. Follow-up in neurology clinic in six weeks.       ROS:   14 system review of systems performed and negative with exception of those listed in HPI  PMH:  Past Medical History:  Diagnosis Date  .  Automatic implantable cardioverter-defibrillator in situ   . Chronic CHF (congestive heart failure) (HCC)    a. EF 15-20% b. RHC (09/2013) RA 14, RV 57/22, PA 64/36 (48), PCWP 18, FIck CO/CI 3.7/1.6, PVR 8.1 WU, PA sat 47%   . History of stomach ulcers   . Hypertension   . Hypotension   . Hypothyroidism   . Morbid obesity ([place])   . Myocardial infarction ([place]) 08/2013  . Nocturnal dyspnea   . Nonischemic cardiomyopathy ([place])   . Sinus tachycardia   . Sleep apnea   . Snoring-prob OSA 09/04/2011  . Sprint Fidelis ICD lead RECALL  [id number]   . UARS (upper airway resistance syndrome) 09/04/2011   HST 12/2013:  AHI 4/hr (numerous episodes of airflow reduction that did not have concomitant desaturation)     PSH:  Past Surgical History:  Procedure Laterality Date  . BREATH TEK H PYLORI N/A 11/09/2014   Procedure: BREATH TEK H PYLORI;  Surgeon: Greer Pickerel, MD;  Location: [facility];  Service: General;  Laterality: N/A;  . CARDIAC CATHETERIZATION  ~ 2006; 09/2013  . CARDIAC CATHETERIZATION N/A 05/23/2016   Procedure: Right Heart Cath;  Surgeon: Elby Showers  [name], MD;  Location: [place] [facility];  Service: Cardiovascular;  Laterality: N/A;  . CARDIAC DEFIBRILLATOR PLACEMENT  2006; 12/26/2013   Medtronic [id number]; 12-2013 ICD gen change and RV lead revision with new 5188 RV lead by Dr [name]  . CESAREAN SECTION  1999  . IMPLANTABLE CARDIOVERTER DEFIBRILLATOR GENERATOR CHANGE N/A 12/26/2013   Procedure: IMPLANTABLE CARDIOVERTER DEFIBRILLATOR GENERATOR CHANGE;  Surgeon: Deboraha Sprang, MD;  Location: [facility] CATH LAB;  Service: Cardiovascular;  Laterality: N/A;  . IR CT HEAD LTD  04/17/2020  . IR CT HEAD LTD  04/17/2020  . IR INTRA CRAN STENT  04/17/2020  . IR PERCUTANEOUS ART THROMBECTOMY/INFUSION INTRACRANIAL INC DIAG ANGIO  04/17/2020  . IR RADIOLOGIST EVAL & MGMT  06/05/2020  . LEAD REVISION N/A 12/26/2013   Procedure: LEAD REVISION;  Surgeon: Deboraha Sprang, MD;  Location: [facility] CATH LAB;  Service:  Cardiovascular;  Laterality: N/A;  . RADIOLOGY WITH ANESTHESIA N/A 04/17/2020   Procedure: Code Stroke;  Surgeon: Luanne Bras, MD;  Location: [place];  Service: Radiology;  Laterality: N/A;  . RIGHT HEART CATHETERIZATION N/A 02/15/2014   Procedure: RIGHT HEART CATH;  Surgeon: Larey Dresser, MD;  Location: [facility] CATH LAB;  Service: Cardiovascular;  Laterality: N/A;  . TUBAL LIGATION  1999    Social History:  Social History   Socioeconomic History  . Marital status: Married    Spouse name: Jonee Lamore  . Number of children: 2  . Years of education: Not on file  . Highest education level: Not on file  Occupational History  . 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 Smoking status: Never Smoker  . Smokeless tobacco: Never Used  Vaping Use  . Vaping Use: Never used  Substance and Sexual Activity  . Alcohol use: No  . Drug use: No  . Sexual activity: Yes  Other Topics Concern  . Not on file  Social History Narrative   [place] sometimes   Right handed   Social Determinants of Health   Financial Resource Strain: Not on file  Food Insecurity: Not on file  Transportation Needs: Not on file  Physical Activity: Not on file  Stress: Not on file  Social Connections: Not on file  Intimate Partner Violence: Not on file    Family History:  Family History  Problem Relation Age of Onset  . Heart disease Maternal Grandmother   . Heart disease Mother   . Obesity Mother   . High blood pressure Father   . Stroke Father     Medications:   Current Outpatient Medications on File Prior to Visit  Medication Sig Dispense Refill  . amiodarone (PACERONE) 200 MG tablet Take 1.5 tablets (300 mg) by mouth once daily    . aspirin 81 MG chewable tablet Chew 1 tablet (81 mg total) by mouth daily. 30 tablet 11  . atorvastatin (LIPITOR) 40 MG tablet Take 40 mg by mouth daily.    . carvedilol (COREG) 3.125 MG tablet Take 3.125 mg by mouth 2 (two) times daily with a meal.     . digoxin (LANOXIN) 0.125 MG tablet Take 1 tablet (0.125 mg) by mouth once every other day    . FARXIGA 10 MG TABS tablet TAKE 1 TABLET BY MOUTH ONCE A DAY BEFOREBREAKFAST 30 tablet 5  . levothyroxine (SYNTHROID) 25 MCG tablet Take 1 tablet (25 mcg total) by mouth daily before breakfast. 90 tablet 3  . metolazone (ZAROXOLYN) 2.5 MG tablet Take 1 tablet (  2.5 mg total) by mouth as needed (extra fluid as directed by HF clinic). 5 tablet 3  . potassium chloride SA (KLOR-CON) 20 MEQ tablet Take 40 mEq by mouth 2 (two) times daily.    Marland Kitchen quiniDINE gluconate 324 MG CR tablet Take 1 tablet (324 mg total) by mouth 2 (two) times daily. 60 tablet 6  . sacubitril-valsartan (ENTRESTO) 24-26 MG Take 1 tablet by mouth 2 (two) times daily. 60 tablet 3  . spironolactone (ALDACTONE) 25 MG tablet TAKE 1 TABLET BY MOUTH EVERY NIGHT AT BEDTIME 90 tablet 3  . ticagrelor (BRILINTA) 90 MG TABS tablet Take 1 tablet (90 mg total) by mouth 2 (two) times daily. 60 tablet 11  . torsemide (DEMADEX) 20 MG tablet Take 80mg  (4 tabs) every morning AND 40mg  (2 tabs) every other afternoon 200 tablet 11   No current facility-administered medications on file prior to visit.    Allergies:  No Known Allergies    OBJECTIVE:  Physical Exam  Vitals:   10/29/20 0758  BP: 95/66  Pulse: 79  Weight: 243 lb (110.2 kg)  Height: 5\' 4"  (1.626 m)   Body mass index is 41.71 kg/m. No exam data present  General: well developed, well nourished,  pleasant middle-aged American female seated, in no evident distress Head: head normocephalic and atraumatic.   Neck: supple with no carotid or supraclavicular bruits Cardiovascular: regular rate and rhythm, no murmurs Musculoskeletal: no deformity Skin:  no rash/petichiae Vascular:  Normal pulses all extremities   Neurologic Exam Mental Status: Awake and fully alert.   Fluent speech and language.  Oriented to place and time. Recent and remote memory intact. Attention span, concentration  and fund of knowledge appropriate. Mood and affect appropriate.  Cranial Nerves: Pupils equal, briskly reactive to light. Extraocular movements full without nystagmus. Visual fields full to confrontation. Hearing intact. Facial sensation intact. Face, tongue, palate moves normally and symmetrically.  Motor: Normal bulk and tone. Normal strength in all tested extremity muscles except slightly decreased right hand dexterity and mild right ankle dorsiflexion weakness Sensory.: intact to touch , pinprick , position and vibratory sensation is subjectively reports "odd sensation" RUE and RLE with light touch.  Coordination: Rapid alternating movements normal in all extremities except slightly decreased right hand dexterity. Finger-to-nose and heel-to-shin performed accurately bilaterally. Gait and Station: Arises from chair without difficulty. Stance is normal. Gait demonstrates normal stride length and balance without use of assistive device.  Tandem walk and heel toe performed without difficulty.   Reflexes: 1+ and symmetric. Toes downgoing.         ASSESSMENT: AANIKA DEFOOR is a 50 y.o. year old female presented with right-sided weakness on 04/17/2020 with stroke work-up revealing left MCA embolic stroke in setting of left M1 occlusion s/p IR thrombectomy and stent placement felt secondary to cardioembolic etiology.  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     PLAN:  1. L MCA stroke :  a. Residual deficit: Right hemisensory impairment leg>arm.  Discussed use of certain medications such as gabapentin to help with painful paresthesias but she declines interest at this time - she is aware to contact office if she wishes to try b. Continue aspirin 81 mg daily  and atorvastatin for secondary stroke prevention.   c. Discussed secondary stroke prevention measures and importance of establishing care with PCP for  ongoing aggressive stroke risk factor management 2. L M1 occlusion: s/p  stent on Brilinta.  CTA head/neck 08/2020 s/p stent spanning L M1 and proximal M2/MCA posterior division branch, filling defect seen within the stent on prior CT resolved (nonocclusive thrombus), no new large vessel occlusion or significant stenosis.  Plans to repeat in 6 months.  Routinely followed by Dr. Estanislado Pandy - plans to continue Brilinta until follow-up imaging 3. HTN: BP goal <130/90.  Stable for patient routinely monitored by cardiology 4. HLD: LDL goal <70.  Prior LDL 130 (03/2020).  On atorvastatin 40 mg daily per cardiology - repeat lipid panel 5. OSA on CPAP: Discussed use of importance of nightly compliance for secondary stroke prevention and cardiovascular risk factors. Reports some difficulty tolerating -she was advised to reach out to her provider that manages to further discuss 6. Systolic HF: Routinely monitored by CHF clinic and cardiology    Follow up in 6 months or call earlier if needed  CC:  [place] provider: Dr. Dimple Nanas, [facility]  [facility] [address] [place], [place] [address]  Phone [phone] Fax [phone] Note: This document was prepared with digital dictation and possible smart phrase technology. Any transcriptional errors that result from this process are unintentional.

## 2020-10-29 NOTE — Patient Instructions (Addendum)
Consider use of gabapentin for night time nerve pain - if you would like to use this, please let me know  Continue aspirin 81 mg daily  and atorvastatin  for secondary stroke prevention Continue brilinta as advised by Dr. Estanislado Pandy until repeat imaging  We will repeat cholesterol levels today   Continue to follow up with PCP regarding cholesterol and blood pressure management  Maintain strict control of hypertension with blood pressure goal below 130/90 and cholesterol with LDL cholesterol (bad cholesterol) goal below 70 mg/dL.      Followup in the future with me in 6 months or call earlier if needed     Thank you for coming to see [name] at [facility]. I hope we have been able to provide you high quality care today.  You may receive a patient satisfaction survey over the next few weeks. We would appreciate your feedback and comments so that we may continue to improve ourselves and the health of our patients.

## 2020-10-29 NOTE — Progress Notes (Signed)
I agree with the above plan 

## 2020-10-30 ENCOUNTER — Ambulatory Visit (INDEPENDENT_AMBULATORY_CARE_PROVIDER_SITE_OTHER): Payer: Medicare Other

## 2020-10-30 ENCOUNTER — Other Ambulatory Visit: Payer: Self-pay | Admitting: Adult Health

## 2020-10-30 ENCOUNTER — Telehealth: Payer: Self-pay

## 2020-10-30 DIAGNOSIS — Z95810 Presence of automatic (implantable) cardiac defibrillator: Secondary | ICD-10-CM

## 2020-10-30 DIAGNOSIS — I5022 Chronic systolic (congestive) heart failure: Secondary | ICD-10-CM

## 2020-10-30 LAB — LIPID PANEL
Chol/HDL Ratio: 3.6 ratio (ref 0.0–4.4)
Cholesterol, Total: 217 mg/dL — ABNORMAL HIGH (ref 100–199)
HDL: 60 mg/dL (ref >39)
LDL Chol Calc (NIH): 138 mg/dL — ABNORMAL HIGH (ref 0–99)
Triglycerides: 105 mg/dL (ref 0–149)
VLDL Cholesterol Cal: 19 mg/dL (ref 5–40)

## 2020-10-30 MED ORDER — ATORVASTATIN CALCIUM 80 MG PO TABS: 80.0000 mg | ORAL_TABLET | Freq: Every day | ORAL | 3 refills | Status: DC

## 2020-10-30 NOTE — Telephone Encounter (Signed)
Contacted pt regarding labs, informed her that her recent cholesterol levels remain elevated but LDL or bad cholesterol at 138 with goal of less than 70. Janett Billow recommend increasing her atorvastatin from 40 mg to 80 mg daily - and a new rx will be sent to her pharmacy. Advised to call the office with further questions, she understood and had none at the time.

## 2020-10-30 NOTE — Telephone Encounter (Signed)
-----   Message from Frann Rider, NP sent at 10/30/2020  7:47 AM EDT ----- Please advise patient that her recent cholesterol levels remain elevated but LDL or bad cholesterol at 138 with goal of less than 70.  Recommend increasing her atorvastatin from 40 mg to 80 mg daily - a new rx will be sent to her pharmacy.  Thank you.

## 2020-10-30 NOTE — Progress Notes (Signed)
EPIC Encounter for ICM Monitoring  Patient Name: [name] is a 50 y.o. female Date: 10/30/2020 Primary Care Physican: Patient, No Pcp Per (Inactive) Primary Cardiologist:[place] Electrophysiologist:[place] 5/3/2022Office Weight: 242lbs  Spoke with patient and reports feeling well at this time.  Denies fluid symptoms.    OptivolThoracicimpedancenormal.  Prescribed:  Torsemide20 mgTake4tablets(80 mg total)by mouth everymorning and 2 tablets (40 mg total) every other afternoon.   Potassium 20 mEqTake2tablets (40 mEq total) by mouthtwice a day  Spironolactone 25 mg take 0.5 tablet(12.5 mg total) daily.   Farxiga 10 mg take 1 tablet daily.  Metolazone 2.5 mgTake 2.5 mg by mouth as needed (extra fluid as directed by HF clinic).  Labs: BMET scheduled 11/01/2020 10/23/2020 Creatinine 1.36, BUN 17, Potassium 4.4, Sodium 135, GFR 48 08/21/2020 Creatinine 1.36, BUN 17, Potassium 4.1, Sodium 136, GFR 48 08/07/2020 Creatinine 1.15, BUN 16, Potassium 3.9, Sodium 138, GFR 58 07/23/2020 Creatinine 1.67, BUN 18, Potassium 3.0, Sodium 134, GFR 37 A complete set of results can be found in Results Review.  Recommendations:No changes and encouraged to call if experiencing any fluid symptoms.  Follow-up plan: ICM clinic phone appointment on6/20/2022. 91 day device clinic remote transmission6/27/2022.   EP/Cardiology Office Visits:5/24/2022with HF Clinic.12/25/2020 with Dr [name]  Copy of ICM check sent to [place]    3 month ICM trend: 10/30/2020.    1 Year ICM trend:       [name], RN 10/30/2020 9:19 AM

## 2020-11-01 ENCOUNTER — Other Ambulatory Visit: Payer: Self-pay

## 2020-11-01 ENCOUNTER — Ambulatory Visit (HOSPITAL_COMMUNITY)
Admission: RE | Admit: 2020-11-01 | Discharge: 2020-11-01 | Disposition: A | Discharge status: Home / Self Care | Payer: BC Managed Care – PPO | Source: Ambulatory Visit | Attending: Cardiology | Admitting: Cardiology

## 2020-11-01 DIAGNOSIS — I428 Other cardiomyopathies: Secondary | ICD-10-CM | POA: Insufficient documentation

## 2020-11-01 LAB — BASIC METABOLIC PANEL
Anion gap: 5 (ref 5–15)
BUN: 14 mg/dL (ref 6–20)
CO2: 27 mmol/L (ref 22–32)
Calcium: 9 mg/dL (ref 8.9–10.3)
Chloride: 104 mmol/L (ref 98–111)
Creatinine, Ser: 1.38 mg/dL — ABNORMAL HIGH (ref 0.44–1.00)
GFR, Estimated: 47 mL/min — ABNORMAL LOW (ref >60)
Glucose, Bld: 93 mg/dL (ref 70–99)
Potassium: 4.1 mmol/L (ref 3.5–5.1)
Sodium: 136 mmol/L (ref 135–145)

## 2020-11-13 ENCOUNTER — Ambulatory Visit (HOSPITAL_COMMUNITY)
Admission: RE | Admit: 2020-11-13 | Discharge: 2020-11-13 | Disposition: A | Discharge status: Home / Self Care | Payer: BC Managed Care – PPO | Source: Ambulatory Visit | Attending: Cardiology | Admitting: Cardiology

## 2020-11-13 ENCOUNTER — Encounter (HOSPITAL_COMMUNITY): Payer: Self-pay

## 2020-11-13 ENCOUNTER — Other Ambulatory Visit: Payer: Self-pay

## 2020-11-13 VITALS — BP 90/62 | HR 69 | Wt 240.2 lb

## 2020-11-13 DIAGNOSIS — R55 Syncope and collapse: Secondary | ICD-10-CM | POA: Diagnosis not present

## 2020-11-13 DIAGNOSIS — I428 Other cardiomyopathies: Secondary | ICD-10-CM | POA: Insufficient documentation

## 2020-11-13 DIAGNOSIS — Z6841 Body Mass Index (BMI) 40.0 and over, adult: Secondary | ICD-10-CM | POA: Insufficient documentation

## 2020-11-13 DIAGNOSIS — I272 Pulmonary hypertension, unspecified: Secondary | ICD-10-CM | POA: Diagnosis not present

## 2020-11-13 DIAGNOSIS — I493 Ventricular premature depolarization: Secondary | ICD-10-CM | POA: Insufficient documentation

## 2020-11-13 DIAGNOSIS — Z8673 Personal history of transient ischemic attack (TIA), and cerebral infarction without residual deficits: Secondary | ICD-10-CM | POA: Insufficient documentation

## 2020-11-13 DIAGNOSIS — Z7982 Long term (current) use of aspirin: Secondary | ICD-10-CM | POA: Diagnosis not present

## 2020-11-13 DIAGNOSIS — I110 Hypertensive heart disease with heart failure: Secondary | ICD-10-CM | POA: Insufficient documentation

## 2020-11-13 DIAGNOSIS — Z95810 Presence of automatic (implantable) cardiac defibrillator: Secondary | ICD-10-CM | POA: Insufficient documentation

## 2020-11-13 DIAGNOSIS — Z79899 Other long term (current) drug therapy: Secondary | ICD-10-CM | POA: Insufficient documentation

## 2020-11-13 DIAGNOSIS — Z8249 Family history of ischemic heart disease and other diseases of the circulatory system: Secondary | ICD-10-CM | POA: Diagnosis not present

## 2020-11-13 DIAGNOSIS — I5022 Chronic systolic (congestive) heart failure: Secondary | ICD-10-CM | POA: Insufficient documentation

## 2020-11-13 DIAGNOSIS — Z7902 Long term (current) use of antithrombotics/antiplatelets: Secondary | ICD-10-CM | POA: Insufficient documentation

## 2020-11-13 DIAGNOSIS — Z7984 Long term (current) use of oral hypoglycemic drugs: Secondary | ICD-10-CM | POA: Insufficient documentation

## 2020-11-13 DIAGNOSIS — E876 Hypokalemia: Secondary | ICD-10-CM | POA: Diagnosis not present

## 2020-11-13 LAB — BASIC METABOLIC PANEL
Anion gap: 8 (ref 5–15)
BUN: 17 mg/dL (ref 6–20)
CO2: 26 mmol/L (ref 22–32)
Calcium: 9.2 mg/dL (ref 8.9–10.3)
Chloride: 101 mmol/L (ref 98–111)
Creatinine, Ser: 1.42 mg/dL — ABNORMAL HIGH (ref 0.44–1.00)
GFR, Estimated: 45 mL/min — ABNORMAL LOW (ref >60)
Glucose, Bld: 88 mg/dL (ref 70–99)
Potassium: 3.9 mmol/L (ref 3.5–5.1)
Sodium: 135 mmol/L (ref 135–145)

## 2020-11-13 NOTE — Patient Instructions (Signed)
It was great to see you today! No medication changes are needed at this time.  Labs today We will only contact you if something comes back abnormal or we need to make some changes. Otherwise no news is good news!  Your physician recommends that you schedule a follow-up appointment in: 3-4 months with Dr [name]  Do the following things EVERYDAY: 1) Weigh yourself in the morning before breakfast. Write it down and keep it in a log. 2) Take your medicines as prescribed 3) Eat low salt foods--Limit salt (sodium) to 2000 mg per day.  4) Stay as active as you can everyday 5) Limit all fluids for the day to less than 2 liters  At the [facility], you and your health needs are our priority. As part of our continuing mission to provide you with exceptional heart care, we have created designated Provider Care Teams. These Care Teams include your primary Cardiologist (physician) and Advanced Practice Providers (APPs- Physician Assistants and Nurse Practitioners) who all work together to provide you with the care you need, when you need it.   You may see any of the following providers on your designated Care Team at your next follow up: Marland Kitchen Dr Glori Bickers . Dr [name] . Dr Vickki Muff . Darrick Grinder, NP . Lyda Jester, [place] . Audry Riles, PharmD   Please be sure to bring in all your medications bottles to every appointment.   If you have any questions or concerns before your next appointment please send [name] a message through [place] or call our office at [phone].    TO LEAVE A MESSAGE FOR THE NURSE SELECT OPTION 2, PLEASE LEAVE A MESSAGE INCLUDING: . YOUR NAME . DATE OF BIRTH . CALL BACK NUMBER . REASON FOR CALL**this is important as we prioritize the call backs  YOU WILL RECEIVE A CALL BACK THE SAME DAY AS LONG AS YOU CALL BEFORE 4:00 PM

## 2020-11-13 NOTE — Progress Notes (Signed)
ReDS Vest / Clip - 11/13/20 1000      ReDS Vest / Clip   Station Marker A    Ruler Value 29    ReDS Value Range Moderate volume overload    ReDS Actual Value 39

## 2020-11-13 NOTE — Progress Notes (Signed)
ID:  [name], DOB 09/04/1970, MRN [id number]    Provider location: [place] [facility] Type of Visit: Established patient   PCP:  [name], MD    Cardiologist:  [name], MD  History of Present Illness: LASHAWNNA Clark is a 50 y.o. 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  She was followed for systolic CHF initially in [place]. Apparently her EF recovered to 35-40%. However, she was admitted to [facility] 09/19/13 for worsening dyspnea and CP. Coronaries were reportedly OK on LHC at that time at [facility]. Echo showed EF of 15-20% with four-chamber enlargement with moderate-severe TR/MR. RHC as below. Rheumatological w/u negative.  CPX in 9/16 showed near-normal functional capacity.  Repeat echo in 6/17 shows that EF remains 20%. Repeat CPX in 1/18 was submaximal but probably only mild circulatory limitation.  Echo 8/18 showed EF 25-30%, severe LV dilation.  CPX in 6/19 showed low normal functional capacity.  Echo in 10/19 showed EF 20-25%, moderate LV dilation.   Zio patch in 2/20 with 11% PVCs and NSVT, amiodarone started. Zio patch in 3/20 with 13.3% PVCs and NSVT.  She saw Dr. [name], he thought that increased PVCs were electromechanical in nature due to worsening heart failure.  Repeat Zio patch on amiodarone in 1/21 showed 6.2% PVCs.   She was admitted in 4/20 with VT in setting of hypokalemia.    CPX in 9/20 showed mild functional limitation, more due to body habitus than heart failure.  Echo in 4/21 showed EF 20% with severe LV dilation, diffuse hypokinesis, normal RV size and systolic function.   In 8/21, she had a shock for VF in the setting of hypokalemia.   Patient was admitted in 10/21 with CVA involving left MCA territory.  This was thought to be cardioembolic but LV thrombus was not visualized and no atrial fibrillation has been seen.  Echo  in 10/21 showed EF 20-25%, severe LV dilation (no LV thrombus seen), mildly decreased RV systolic function, mild-moderate MR. She was taken by IR for thrombectomy and stent placement, now on ASA 81 and ticagrelor.   She had recurrent VT in 2/22, quinidine was added by EP.  Spironolactone and Coreg were decreased due to low BP and lightheadedness.    Echo 5/22 showed EF 25% with severe LV dilation and global hypokinesis, normal RV, mild-moderate MR.   Recently seen by Dr. [name] 10/23/20.  She had just finished cardiac rehab.  She felt like this helped her a lot, improving exercise tolerance. Noted being able to walk on the treadmill for 20-25 minutes without dyspnea, however she did endorse recent orthopnea, over the last couple weeks + abdominal bloating.  Wt was up 9 lbs. Device interrogation showed fluid index > threshold. ReDs clip was also elevated at 42%. Torsemide was increased to 80 qam/40 qpm x 3 days then 80 qam, 40 every other pm after that. Spironolactone also increased to 25 mg daily.   Since visit, she had remote device interrogation on 5/10 showing normal thoracic impedence. No further diuretic changes made.   She returns back to clinic today for f/u. Wt down 2 lb from 242>>240 lb. Impedence normal. Fluid index below threshold. No VT/VF. No AT/AF on device interrogation. Feels better. BP soft, 90/62. She notes occasional mild and brief dizziness if she stands too quickly. ReDs Clip 42>>39%    Medtronic  device interrogation: Optivol Impedence normal. Fluid index well below threshold.  REDS clip: 42>>39%   ECG (4/22, personally reviewed): not performed today   PMH: 1. OSA: Using CPAP.  2. PVCs:  - Zio patch 2/20: 11% PVCs - Zio patch 3/20: 13.3% PVCs - Zio patch 1/21: 6.2% PVCs 3. VT/VF: 8/21, in setting of hypokalemia.  2/22 VT, quinidine added.  4. 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  No LV thrombus visualized and atrial fibrillation has not been visualized.  - IR thrombectomy  with stent placement in 10/21.  5. Chronic systolic CHF: Nonischemic cardiomyopathy:   - Coronary angiography 2015 [facility]: Normal coronaries.  - Echo (8/15) with EF 20%, moderate LV dilation, diffuse hypokinesis, normal RV size with mildly decreased systolic function, mild MR.  - Echo (5/16) with EF 20%, spherical LV with diffuse hypokinesis, mild MR.  - Echo (6/17) with EF 20%, diffuse hypokinesis, moderate LV dilation, normal RV, mild to moderate MR.  - RHC (12/17): mean RA 5, PA 56/31, mean PCWP 24, CI 2, PVR 3.7 WU - Echo (8/18) with EF 25-30%, severe LV dilation, severe LAE.  - Echo (2/20) with EF 20-25%, moderate LV dilation, moderate MR.  - Echo (4/21) with EF 20-25%, severe LV dilation, mild-moderate MR, normal RV, PASP 26 mmHg.  - Echo (10/21) with EF 20-25%, severe LV dilation (no LV thrombus seen), mildly decreased RV systolic function, mild-moderate MR. - CPX in 9/20 showed mild functional limitation, more due to body habitus than heart failure.  - CPX in 8/21: Peak VO2 11.6, VE/VCO2 32, RER 1.2.  Mild HF limitation.  - Echo (4/22): EF 25% with severe LV dilation and global hypokinesis, normal RV, mild-moderate MR.  6. PFTs (1/22): Near normal.   Labs: (11/09/13): Dig level 0.5, K 3.6, Creatinine 0.83, pro-BNP 622 (12/19/13): K 3.6, creatinine 0.8 (7/15): K 4.1, creatinine 0.91 (8/15): K 3.9, creatinine 0.91 (10/15): K 4, creatinine 0.93, proBNP 129 (12/15): K 4, creatinine 0.9, digoxin 0.4 (5/16): K 4, creatinine 0.86 (9/16): K 3.6, creatinine 0.86, digoxin < 0.2 (5/17): K 4.1, creatinine 0.95, digoxin 0.3 (5/18): K 3.4, creatinine 0.95, TSH elevated but free T3 and free T4 normal.  (8/18): K 3.9, creatinine 0.87, BNP 108  (11/18): K 3.5, creatinine 1.04, digoxin 0.4 (1/19): LDL 91, HDL 47 (3/19): digoxin 0.7, K 4, creatinine 0.99 (4/19): TSH normal, LDL 95 (9/19): LDL 99, HDL 45, K 4, creatinine 0.96 (10/19): K 3.6, creatinine 0.85, digoxin 0.5 (2/20): LDL 103 (3/20):  K 3.9, creatinine 1.05 (4/20): K 3.9, creatinine 1.23 => 1.15, TSH 8, digoxin 0.3, LFTs normal 6/20): K 4.3, creatinine 1.07, LFTs normal, digoxin 0.5 (7/20): K 3.8, creatinine 1.16 (8/20): digoxin < 0.2, LDL 135 (1/21): K 3.9, creatinine 1.05, LDL 171  (4/21): K 4.2, creatinine 0.95, LDL 127, TSH normal, LFTs normal (10/21): K 2.8, creatinine 1.21, LDL 130 (11/21): K 4.5, creatinine 1.3 => 1.2, LFTs normal, digoxin 1.3, TSH normal (3/22): K 4.1, creatinine 1.36 (4/22): hgb 10.8 (5/22): SCr 1.38, K 4.1, LDL 138   Current Outpatient Medications  Medication Sig Dispense Refill  . amiodarone (PACERONE) 200 MG tablet Take 1.5 tablets (300 mg) by mouth once daily    . aspirin 81 MG chewable tablet Chew 1 tablet (81 mg total) by mouth daily. 30 tablet 11  . atorvastatin (LIPITOR) 80 MG tablet Take 1 tablet (80 mg total) by mouth daily. 90 tablet 3  . carvedilol (COREG) 3.125 MG tablet Take 3.125 mg by mouth 2 (two) times daily  with a meal.    . digoxin (LANOXIN) 0.125 MG tablet Take 1 tablet (0.125 mg) by mouth once every other day    . FARXIGA 10 MG TABS tablet TAKE 1 TABLET BY MOUTH ONCE A DAY BEFOREBREAKFAST 30 tablet 5  . levothyroxine (SYNTHROID) 25 MCG tablet Take 1 tablet (25 mcg total) by mouth daily before breakfast. 90 tablet 3  . metolazone (ZAROXOLYN) 2.5 MG tablet Take 1 tablet (2.5 mg total) by mouth as needed (extra fluid as directed by HF clinic). 5 tablet 3  . potassium chloride SA (KLOR-CON) 20 MEQ tablet Take 40 mEq by mouth 2 (two) times daily.    Marland Kitchen quiniDINE gluconate 324 MG CR tablet Take 1 tablet (324 mg total) by mouth 2 (two) times daily. 60 tablet 6  . sacubitril-valsartan (ENTRESTO) 24-26 MG Take 1 tablet by mouth 2 (two) times daily. 60 tablet 3  . spironolactone (ALDACTONE) 25 MG tablet TAKE 1 TABLET BY MOUTH EVERY NIGHT AT BEDTIME 90 tablet 3  . ticagrelor (BRILINTA) 90 MG TABS tablet Take 1 tablet (90 mg total) by mouth 2 (two) times daily. 60 tablet 11  .  torsemide (DEMADEX) 20 MG tablet Take 80mg  (4 tabs) every morning AND 40mg  (2 tabs) every other afternoon 200 tablet 11   No current facility-administered medications for this encounter.    Allergies:   Patient has no known allergies.   Social History:  The patient  reports that she has never smoked. She has never used smokeless tobacco. She reports that she does not drink alcohol and does not use drugs.   Family History:  The patient's family history includes Heart disease in her maternal grandmother and mother; High blood pressure in her father; Obesity in her mother; Stroke in her father.   ROS:  Please see the history of present illness.   All other systems are personally reviewed and negative.   Exam:   BP 90/62   Pulse 69   Wt 109 kg (240 lb 4 oz)   SpO2 100%   BMI 41.24 kg/m  PHYSICAL EXAM: General:  Well appearing, moderately obese. No respiratory difficulty HEENT: normal Neck: supple. no JVD. Carotids 2+ bilat; no bruits. No lymphadenopathy or thyromegaly appreciated. Cor: PMI nondisplaced. Regular rate & rhythm. No rubs, gallops or murmurs. Lungs: clear Abdomen: soft, nontender, nondistended. No hepatosplenomegaly. No bruits or masses. Good bowel sounds. Extremities: no cyanosis, clubbing, rash, edema Neuro: alert & oriented x 3, cranial nerves grossly intact. moves all 4 extremities w/o difficulty. Affect pleasant.   Recent Labs: 01/26/2020: B Natriuretic Peptide 929.8 04/20/2020: Magnesium 1.9 07/23/2020: NT-Pro BNP 312 08/07/2020: TSH 4.066 08/21/2020: ALT 14 09/25/2020: Hemoglobin 10.8; Platelets 383 11/01/2020: BUN 14; Creatinine, Ser 1.38; Potassium 4.1; Sodium 136  Personally reviewed   Wt Readings from Last 3 Encounters:  11/13/20 109 kg (240 lb 4 oz)  10/29/20 110.2 kg (243 lb)  10/23/20 110 kg (242 lb 9.6 oz)      ASSESSMENT AND PLAN:  1. Chronic systolic HF: Nonischemic cardiomyopathy.  She has a Medtronic ICD. Cause for cardiomyopathy is uncertain,  initially identified peri-partum (after son's birth), so cannot rule out peri-partum CMP.  On and off, she has had frequent PVCs.   RHC in 8/15 showed normal filling pressures and preserved cardiac output.  She does not qualify for CRT with relatively narrow QRS.  CPX 9/20 with mildly decreased functional capacity due to body habitus and HF.  CPX in 8/21 also showed mild functional limitation.  Echo  in 10/21 showed stable EF 20-25% with severe LV dilation, mildly decreased RV systolic function, and mild-moderate MR. Echo in 4/22 with EF 25%, severe LV dilation.  Volume status improved w/ recent diuretic dose adjustment. Optivol impedence has returned to normal and fluid index well below threshold.  NYHA class II symptoms.   - Continue torsemide 80 qam, 40 every other pm. Check BMP today  - Continue Entresto 24/26 bid, did not tolerate higher dose due to orthostatic symptoms and syncope.  - She is on a lower Coreg dose 3.125 mg bid due to orthostasis.    - Continue spironolactone 25 mg but take qhs.   - She is on digoxin 0.0625.  Check level today.   - Continue dapagliflozin 10 mg daily.   - EF remains low, if she worsens clinically would be candidate for LVAD.  BMI too high currently for transplant.   - Repeat CPX later this year.  - She was evaluated for baroreceptor activation therapy (BATwire) but failed screen. 2. Pulmonary HTN: Mixed picture with PVR 8.1 on RHC at [facility].  However, repeat study in 8/15 here showed no significant pulmonary arterial hypertension.  2017 RHC with pulmonary venous hypertension.  3. Morbid obesity: Continue to work on weight loss.  She is going to work aggressively on diet and increase her exercise level.    4. Hyperlipidemia: Continue atorvastatin.  5. PVCs: 11% PVCs + NSVT on 2/20 Zio patch.  Amiodarone started, but PVCs up to 13.3% with NSVT on 3/20 Zio patch. She saw Dr. [name] who thought that the ventricular arrhythmias were electromechanical due to worsening HF.  In  4/20, she had VT x 2 with ICD discharges in the setting of hypokalemia. Zio patch in 1/21 with PVCs down to 6.2% of beats. VT again in 2/22, quinidine started.  - Continue amiodarone, recent TFTs/HFTs ok.  She will need a regular eye exam with amiodarone use.  - Continue quinidine per EP.  6. CVA: CVA in 10/21, had thrombectomy with stent placement.  No LV thrombus noted and no atrial fibrillation noted.  - To continue ticagrelor at least until repeat head CT in 7/22.   F/u w/ Dr. [name] in 3 months   Signed, Lyda Jester, PA-C  11/13/2020  [address] [place] [id number] [phone] (office) [phone] (fax)

## 2020-12-10 ENCOUNTER — Ambulatory Visit (INDEPENDENT_AMBULATORY_CARE_PROVIDER_SITE_OTHER): Payer: BC Managed Care – PPO

## 2020-12-10 DIAGNOSIS — Z95810 Presence of automatic (implantable) cardiac defibrillator: Secondary | ICD-10-CM

## 2020-12-10 DIAGNOSIS — Z4502 Encounter for adjustment and management of automatic implantable cardiac defibrillator: Secondary | ICD-10-CM

## 2020-12-10 DIAGNOSIS — I5022 Chronic systolic (congestive) heart failure: Secondary | ICD-10-CM | POA: Diagnosis not present

## 2020-12-10 NOTE — Progress Notes (Signed)
EPIC Encounter for ICM Monitoring  Patient Name: [name] is a 50 y.o. female Date: 12/10/2020 Primary Care Physican: Patient, No Pcp Per (Inactive) Primary Cardiologist: [name] Electrophysiologist: [name] 12/10/2020 Weight: 239 lbs   Spoke with patient and reports feeling well at this time. Heart failure questions reviewed. Pt asymptomatic.   Optivol Thoracic impedance suggesting possible fuid accumulation starting 12/04/2020.   Prescribed:  Torsemide 20 mg Take 4 tablets (80 mg total) by mouth every morning and 2 tablets (40 mg total) every other afternoon. Potassium 20 mEq Take 2 tablets (40 mEq total) by mouth twice a day  Spironolactone 25 mg take 0.5 tablet(12.5 mg total) daily.   Farxiga 10 mg take 1 tablet daily. Metolazone 2.5 mg Take 2.5 mg by mouth as needed (extra fluid as directed by HF clinic).   Labs:  11/13/2020 Creatinine 1.42, BUN 17, Potassium 3.9, Sodium 135, GFR 45 11/01/2020 Creatinine 1.38, BUN 14, Potassium 4.1, Sodium 136, GFR 47 10/23/2020 Creatinine 1.36, BUN 17, Potassium 4.4, Sodium 135, GFR 48 08/21/2020 Creatinine 1.36, BUN 17, Potassium 4.1, Sodium 136, GFR 48 08/07/2020 Creatinine 1.15, BUN 16, Potassium 3.9, Sodium 138, GFR 58 07/23/2020 Creatinine 1.67, BUN 18, Potassium 3.0, Sodium 134, GFR 37 A complete set of results can be found in Results Review.   Recommendations:  Pt will self adjust Torsemide and take 2 extra tablets x 1 day   Follow-up plan: ICM clinic phone appointment on 12/17/2020 to recheck fluid levels.   91 day device clinic remote transmission 12/17/2020.         EP/Cardiology Office Visits: 02/11/2021 with Dr [name].  12/25/2020 with Dr [name]   Copy of ICM check sent to Dr. [name].   3 month ICM trend: 12/10/2020.    1 Year ICM trend:       [name], RN 12/10/2020 10:20 AM

## 2020-12-17 ENCOUNTER — Ambulatory Visit (INDEPENDENT_AMBULATORY_CARE_PROVIDER_SITE_OTHER): Payer: BC Managed Care – PPO

## 2020-12-17 ENCOUNTER — Ambulatory Visit (INDEPENDENT_AMBULATORY_CARE_PROVIDER_SITE_OTHER): Payer: Medicare Other

## 2020-12-17 DIAGNOSIS — I428 Other cardiomyopathies: Secondary | ICD-10-CM | POA: Diagnosis not present

## 2020-12-17 DIAGNOSIS — I5022 Chronic systolic (congestive) heart failure: Secondary | ICD-10-CM

## 2020-12-17 DIAGNOSIS — Z95810 Presence of automatic (implantable) cardiac defibrillator: Secondary | ICD-10-CM

## 2020-12-17 LAB — CUP PACEART REMOTE DEVICE CHECK
Battery Remaining Longevity: 39 mo
Battery Voltage: 2.97 V
Brady Statistic RV Percent Paced: 0.01 %
Date Time Interrogation Session: 20220627033423
HighPow Impedance: 72 Ohm
Implantable Lead Implant Date: 20150706
Implantable Lead Location: 753860
Implantable Pulse Generator Implant Date: 20150706
Lead Channel Impedance Value: 456 Ohm
Lead Channel Impedance Value: 570 Ohm
Lead Channel Pacing Threshold Amplitude: 0.5 V
Lead Channel Pacing Threshold Pulse Width: 0.4 ms
Lead Channel Sensing Intrinsic Amplitude: 7.75 mV
Lead Channel Sensing Intrinsic Amplitude: 7.75 mV
Lead Channel Setting Pacing Amplitude: 2 V
Lead Channel Setting Pacing Pulse Width: 0.4 ms
Lead Channel Setting Sensing Sensitivity: 0.3 mV

## 2020-12-17 NOTE — Progress Notes (Signed)
EPIC Encounter for ICM Monitoring  Patient Name: [name] is a 50 y.o. female Date: 12/17/2020 Primary Care Physican: Patient, No Pcp Per (Inactive) Primary Cardiologist: [name] Electrophysiologist: [name] 12/10/2020 Weight: 239 lbs 12/17/2020 Weight: 239 lbs.    Spoke with patient and heart failure questions reviewed.  Pt asymptomatic for fluid accumulation and feeing well.  Optivol Thoracic impedance suggesting possible fuid accumulation starting 12/04/2020 but did not resolve after taking extra Torsemide last week.   Prescribed:  Torsemide 20 mg Take 4 tablets (80 mg total) by mouth every morning and 2 tablets (40 mg total) every other afternoon. Potassium 20 mEq Take 2 tablets (40 mEq total) by mouth twice a day  Spironolactone 25 mg take 0.5 tablet(12.5 mg total) daily.   Farxiga 10 mg take 1 tablet daily. Metolazone 2.5 mg Take 2.5 mg by mouth as needed (extra fluid as directed by HF clinic).   Labs:  11/13/2020 Creatinine 1.42, BUN 17, Potassium 3.9, Sodium 135, GFR 45 11/01/2020 Creatinine 1.38, BUN 14, Potassium 4.1, Sodium 136, GFR 47 10/23/2020 Creatinine 1.36, BUN 17, Potassium 4.4, Sodium 135, GFR 48 08/21/2020 Creatinine 1.36, BUN 17, Potassium 4.1, Sodium 136, GFR 48 08/07/2020 Creatinine 1.15, BUN 16, Potassium 3.9, Sodium 138, GFR 58 07/23/2020 Creatinine 1.67, BUN 18, Potassium 3.0, Sodium 134, GFR 37 A complete set of results can be found in Results Review.   Recommendations:  Pt will self adjust Torsemide (she reports Dr [name] has approved of self adjusting) 80/40 x 4 days and then return to 80 daily with 40 every other day.   Follow-up plan: ICM clinic phone appointment on 12/21/2020 to recheck fluid levels.   91 day device clinic remote transmission 03/18/2021.         EP/Cardiology Office Visits: 02/11/2021 with Dr [name].  12/25/2020 with Dr [name]   Copy of ICM check sent to Dr. [name].  3 month ICM trend: 12/17/2020.    1 Year ICM trend:       [name], RN 12/17/2020 10:45 AM

## 2020-12-21 ENCOUNTER — Ambulatory Visit (INDEPENDENT_AMBULATORY_CARE_PROVIDER_SITE_OTHER): Payer: Medicare Other

## 2020-12-21 DIAGNOSIS — I5022 Chronic systolic (congestive) heart failure: Secondary | ICD-10-CM

## 2020-12-21 DIAGNOSIS — Z95810 Presence of automatic (implantable) cardiac defibrillator: Secondary | ICD-10-CM

## 2020-12-21 NOTE — Progress Notes (Signed)
EPIC Encounter for ICM Monitoring  Patient Name: [name] is a 50 y.o. female Date: 12/21/2020 Primary Care Physican: Patient, No Pcp Per (Inactive) Primary Cardiologist: [name] Electrophysiologist: [name] 12/10/2020 Weight: 239 lbs 12/17/2020 Weight: 239 lbs.  12/25/2020 Weight: 236 lbs    Spoke with patient and heart failure questions reviewed.  Pt continues to be asymptomatic although she has lost 3 pounds since taking extra Torsemide for last week.    Optivol Thoracic impedance continues to suggest possible fuid accumulation starting 12/04/2020 after taking extra Torsemide x 1 week.   Prescribed:  Torsemide 20 mg Take 4 tablets (80 mg total) by mouth every morning and 2 tablets (40 mg total) every other afternoon. Potassium 20 mEq Take 2 tablets (40 mEq total) by mouth twice a day  Spironolactone 25 mg take 0.5 tablet(12.5 mg total) daily.   Farxiga 10 mg take 1 tablet daily. Metolazone 2.5 mg Take 2.5 mg by mouth as needed (extra fluid as directed by HF clinic).   Labs:  11/13/2020 Creatinine 1.42, BUN 17, Potassium 3.9, Sodium 135, GFR 45 11/01/2020 Creatinine 1.38, BUN 14, Potassium 4.1, Sodium 136, GFR 47 10/23/2020 Creatinine 1.36, BUN 17, Potassium 4.4, Sodium 135, GFR 48 08/21/2020 Creatinine 1.36, BUN 17, Potassium 4.1, Sodium 136, GFR 48 08/07/2020 Creatinine 1.15, BUN 16, Potassium 3.9, Sodium 138, GFR 58 07/23/2020 Creatinine 1.67, BUN 18, Potassium 3.0, Sodium 134, GFR 37 A complete set of results can be found in Results Review.   Recommendations:  Pt will self adjust Torsemide until appt with Dr [name] on 7/5.  She will discuss the need to taking extra Torsemide.    Follow-up plan: ICM clinic phone appointment on 01/01/2021 to recheck fluid levels.   91 day device clinic remote transmission 03/18/2021.         EP/Cardiology Office Visits: 02/11/2021 with Dr [name].  12/25/2020 with Dr [name]   Copy of ICM check sent to Dr. [name].   3 month ICM trend: 12/21/2020.    1  Year ICM trend:       [name], RN 12/21/2020 10:48 AM

## 2020-12-25 ENCOUNTER — Ambulatory Visit (INDEPENDENT_AMBULATORY_CARE_PROVIDER_SITE_OTHER): Payer: BC Managed Care – PPO | Admitting: Internal Medicine

## 2020-12-25 ENCOUNTER — Encounter: Payer: Self-pay | Admitting: Internal Medicine

## 2020-12-25 ENCOUNTER — Other Ambulatory Visit: Payer: Self-pay

## 2020-12-25 VITALS — BP 94/66 | HR 82 | Ht 64.0 in | Wt 237.0 lb

## 2020-12-25 DIAGNOSIS — I493 Ventricular premature depolarization: Secondary | ICD-10-CM | POA: Diagnosis not present

## 2020-12-25 DIAGNOSIS — Z79899 Other long term (current) drug therapy: Secondary | ICD-10-CM | POA: Diagnosis not present

## 2020-12-25 DIAGNOSIS — Z95810 Presence of automatic (implantable) cardiac defibrillator: Secondary | ICD-10-CM

## 2020-12-25 DIAGNOSIS — I428 Other cardiomyopathies: Secondary | ICD-10-CM | POA: Diagnosis not present

## 2020-12-25 DIAGNOSIS — I63312 Cerebral infarction due to thrombosis of left middle cerebral artery: Secondary | ICD-10-CM

## 2020-12-25 DIAGNOSIS — I5022 Chronic systolic (congestive) heart failure: Secondary | ICD-10-CM | POA: Diagnosis not present

## 2020-12-25 NOTE — Progress Notes (Signed)
Patient ID: [name], female   DOB: Mar 15, 1971, 50 y.o.   MRN: [id number]       Patient Care Team: Patient, No Pcp Per (Inactive) as PCP - General (General Practice) Larey Dresser, MD as PCP - Cardiology (Cardiology) Deboraha Sprang, MD as PCP - Electrophysiology (Cardiology) Sueanne Margarita, MD as PCP - Sleep Medicine (Cardiology)   HPI  [name] is a 50 y.o. female seen in follow-up for  ICD implanted for nonischemic cardiomyopathy.  She has a history of ventricular tachycardia with polymorphic and monomorphic treated by her device and is on   amiodarone and adjunctive mexiletine which because of recurrent ventricular tachycardia 2/22 was transitioned to quinidine following conversation with colleagues; ablation was not recommended  Recurrent shocks in the setting of hypokalemia most recently 8/21 VT therapy 08/12/20 at which time potassium was presumed normal, 2/15--3.9  &  3/1--4.1  Reviewed with colleagues, not sure about the benefits of ablation, quinidine recommended is a thought for adjunctive medications as opposed to mexiletine.  She is done much better on the quinidine.  Patient denies symptoms of GI intolerance, sun sensitivity, neurological symptoms attributable to amiodarone.     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  Question cardioembolic.  Underwent stenting.  On DAPT.  To see neuroradiology soon with the question of discontinuing Brilinta   Today, the patient denies chest pain, nocturnal dyspnea, orthopnea or peripheral edema, noting that most of her edema is in her belly..  There have been no palpitations, lightheadedness or syncope.  Complains of modest dyspnea on exertion  Treated sleep apnea but unfortunately the treatments have not been well-tolerated and has not been utilized DATE TEST EF%    4/15 LHC (CE)  Cors w/out obstruction  8/15 Echo   20 %    12/17 Cardio Cath (CE)    10/19 Echo  20-25% MR mod  10/21 Echo  20-25%    1/22 Echo 20-25%    5/22 Echo 25%     Date PVCs     /2016 1%    2/20 11%--VTNS Monomorphic pop  VTNS -MM and PM   3/20 13.3% Di morphic population 7.& 4 % Total    Date Cr K TSH LFTs Dig Hgb WBC  4/20 1.37 4.0 12.027 26       11/21  1.28 3.2 (adjusted) 3.1 14  9.6   5/22 1.42 3.9 4.06 (2/22) 14 0.7(1/22) 10.8(4/22) 4.9K (4/22)              Records and Results Reviewed   Past Medical History:  Diagnosis Date   Automatic implantable cardioverter-defibrillator in situ    Chronic CHF (congestive heart failure) ([place])    a. EF 15-20% b. RHC (09/2013) RA 14, RV 57/22, PA 64/36 (48), PCWP 18, FIck CO/CI 3.7/1.6, PVR 8.1 WU, PA sat 47%    History of stomach ulcers    Hypertension    Hypotension    Hypothyroidism    Morbid obesity ([place])    Myocardial infarction ([place]) 08/2013   Nocturnal dyspnea    Nonischemic cardiomyopathy ([place])    Sinus tachycardia    Sleep apnea    Snoring-prob OSA 09/04/2011   Sprint Fidelis ICD lead RECALL  6949    UARS (upper airway resistance syndrome) 09/04/2011   HST 12/2013:  AHI 4/hr (numerous episodes of airflow reduction that did not have concomitant desaturation)     Past Surgical History:  Procedure Laterality Date   BREATH  TEK H PYLORI N/A 11/09/2014   Procedure: BREATH TEK H PYLORI;  Surgeon: Greer Pickerel, MD;  Location: [facility];  Service: General;  Laterality: N/A;   CARDIAC CATHETERIZATION  ~ 2006; 09/2013   CARDIAC CATHETERIZATION N/A 05/23/2016   Procedure: Right Heart Cath;  Surgeon: Larey Dresser, MD;  Location: [facility] CV [facility];  Service: Cardiovascular;  Laterality: N/A;   CARDIAC DEFIBRILLATOR PLACEMENT  2006; 12/26/2013   Medtronic [id number]; 12-2013 ICD gen change and RV lead revision with new 6935 RV lead by Dr [name]   CESAREAN SECTION  1999   IMPLANTABLE CARDIOVERTER DEFIBRILLATOR GENERATOR CHANGE N/A 12/26/2013   Procedure: IMPLANTABLE CARDIOVERTER DEFIBRILLATOR GENERATOR CHANGE;  Surgeon: Deboraha Sprang, MD;  Location: [facility] CATH LAB;  Service:  Cardiovascular;  Laterality: N/A;   IR CT HEAD LTD  04/17/2020   IR CT HEAD LTD  04/17/2020   IR INTRA CRAN STENT  04/17/2020   IR PERCUTANEOUS ART THROMBECTOMY/INFUSION INTRACRANIAL INC DIAG ANGIO  04/17/2020   IR RADIOLOGIST EVAL & MGMT  06/05/2020   LEAD REVISION N/A 12/26/2013   Procedure: LEAD REVISION;  Surgeon: Deboraha Sprang, MD;  [facility] CATH LAB;  Service: Cardiovascular;  Laterality: N/A;   RADIOLOGY WITH ANESTHESIA N/A 04/17/2020   Procedure: Code Stroke;  Surgeon: Luanne Bras, MD;  Location: [place];  Service: Radiology;  Laterality: N/A;   RIGHT HEART CATHETERIZATION N/A 02/15/2014   Procedure: RIGHT HEART CATH;  Surgeon: Larey Dresser, MD;  Location: [facility] CATH LAB;  Service: Cardiovascular;  Laterality: N/A;   TUBAL LIGATION  1999    Current Meds  Medication Sig   amiodarone (PACERONE) 200 MG tablet Take 1.5 tablets (300 mg) by mouth once daily   aspirin 81 MG chewable tablet Chew 1 tablet (81 mg total) by mouth daily.   atorvastatin (LIPITOR) 80 MG tablet Take 1 tablet (80 mg total) by mouth daily.   carvedilol (COREG) 3.125 MG tablet Take 3.125 mg by mouth 2 (two) times daily with a meal.   digoxin (LANOXIN) 0.125 MG tablet Take 1 tablet (0.125 mg) by mouth once every other day   FARXIGA 10 MG TABS tablet TAKE 1 TABLET BY MOUTH ONCE A DAY BEFOREBREAKFAST   levothyroxine (SYNTHROID) 25 MCG tablet Take 1 tablet (25 mcg total) by mouth daily before breakfast.   metolazone (ZAROXOLYN) 2.5 MG tablet Take 1 tablet (2.5 mg total) by mouth as needed (extra fluid as directed by HF clinic).   potassium chloride SA (KLOR-CON) 20 MEQ tablet Take 40 mEq by mouth 2 (two) times daily.   quiniDINE gluconate 324 MG CR tablet Take 1 tablet (324 mg total) by mouth 2 (two) times daily.   sacubitril-valsartan (ENTRESTO) 24-26 MG Take 1 tablet by mouth 2 (two) times daily.   spironolactone (ALDACTONE) 25 MG tablet TAKE 1 TABLET BY MOUTH EVERY NIGHT AT BEDTIME   ticagrelor (BRILINTA) 90  MG TABS tablet Take 1 tablet (90 mg total) by mouth 2 (two) times daily.   torsemide (DEMADEX) 20 MG tablet Take 80mg  (4 tabs) every morning AND 40mg  (2 tabs) every other afternoon    No Known Allergies  Review of Systems negative except from HPI and PMH  Physical Exam: BP 94/66   Pulse 82   Ht 5\' 4"  (1.626 m)   Wt 237 lb (107.5 kg)   SpO2 99%   BMI 40.68 kg/m  Well developed and Morbidly obese in no acute distress HENT normal Neck supple with JVP-6 LungClear Device pocket well healed;  without hematoma or erythema.  There is no tethering  Regular rate and rhythm, no  gallop No  murmur Abd-soft with active BS No Clubbing cyanosis  edema Skin-warm and dry A & Oriented  Grossly normal sensory and motor function  ECG: Sinus rhythm at 82 Intervals 19/14/43 PVCs-frequent with a dimorphic population  Histogram data from her device does not suggest a high volume   Assessment and  Plan:  Nonischemic cardiomyopathy  Ventricular tachycardia-monomorphic and polymorphic-recurrent   Systolic heart failure-chronic    Implantable defibrillator-Medtronic  The patient's device was interrogated.  The information was reviewed. No changes were made in the programming.        High Risk Medication Surveillance --amio/quinidine   MR/TR  Stroke with intracranial stenting on DAPT   No intercurrent ventricular tachycardia; she is tolerating the quinidine and says she feels great.  We will continue 324 twice daily and her amiodarone at 300 mg daily.    For quinidine surveillance, last [place] 4/22 was normal.  We will repeat the [place] today; amiodarone surveillance laboratories are incomplete and we will check a TSH today.  Also need to check his digoxin level  Euvolemic-- following adjustment by the [facility] clinic.  We will continue her Demadex 80 mg daily and stop the 40 mg every other day.    I,Stephanie Williams,acting as a Education administrator for Virl Axe, MD.,have documented all relevant  documentation on the behalf of Virl Axe, MD,as directed by  Virl Axe, MD while in the presence of Virl Axe, MD.  I, Virl Axe, MD, have reviewed all documentation for this visit. The documentation on 12/25/20 for the exam, diagnosis, procedures, and orders are all accurate and complete.

## 2020-12-25 NOTE — Patient Instructions (Addendum)
Medication Instructions:  - Your physician recommends that you continue on your current medications as directed. Please refer to the Current Medication list given to you today.   *If you need a refill on your cardiac medications before your next appointment, please call your pharmacy*   Lab Work: - Your physician recommends that you have lab work today: CBC w/ diff/ TSH/ Digoxin   If you have labs (blood work) drawn today and your tests are completely normal, you will receive your results only by: MyChart Message (if you have MyChart) OR A paper copy in the mail If you have any lab test that is abnormal or we need to change your treatment, we will call you to review the results.   Testing/Procedures: - none ordered   Follow-Up: At [facility], you and your health needs are our priority.  As part of our continuing mission to provide you with exceptional heart care, we have created designated Provider Care Teams.  These Care Teams include your primary Cardiologist (physician) and Advanced Practice Providers (APPs -  Physician Assistants and Nurse Practitioners) who all work together to provide you with the care you need, when you need it.  We recommend signing up for the patient portal called "MyChart".  Sign up information is provided on this After Visit Summary.  MyChart is used to connect with patients for Virtual Visits (Telemedicine).  Patients are able to view lab/test results, encounter notes, upcoming appointments, etc.  Non-urgent messages can be sent to your provider as well.   To learn more about what you can do with MyChart, go to [email or web address].    Your next appointment:   6 month(s)  The format for your next appointment:   In Person  Provider:   Virl Axe, MD   Other Instructions N/a

## 2020-12-26 LAB — CBC WITH DIFFERENTIAL/PLATELET
Basophils Absolute: 0 10*3/uL (ref 0.0–0.2)
Basos: 0 %
EOS (ABSOLUTE): 0 10*3/uL (ref 0.0–0.4)
Eos: 0 %
Hematocrit: 33.8 % — ABNORMAL LOW (ref 34.0–46.6)
Hemoglobin: 10.1 g/dL — ABNORMAL LOW (ref 11.1–15.9)
Immature Grans (Abs): 0 10*3/uL (ref 0.0–0.1)
Immature Granulocytes: 0 %
Lymphocytes Absolute: 1.3 10*3/uL (ref 0.7–3.1)
Lymphs: 17 %
MCH: 21.7 pg — ABNORMAL LOW (ref 26.6–33.0)
MCHC: 29.9 g/dL — ABNORMAL LOW (ref 31.5–35.7)
MCV: 73 fL — ABNORMAL LOW (ref 79–97)
Monocytes Absolute: 0.8 10*3/uL (ref 0.1–0.9)
Monocytes: 10 %
Neutrophils Absolute: 5.6 10*3/uL (ref 1.4–7.0)
Neutrophils: 73 %
Platelets: 430 10*3/uL (ref 150–450)
RBC: 4.65 x10E6/uL (ref 3.77–5.28)
RDW: 17 % — ABNORMAL HIGH (ref 11.7–15.4)
WBC: 7.8 10*3/uL (ref 3.4–10.8)

## 2020-12-26 LAB — TSH: TSH: 4.7 u[IU]/mL — ABNORMAL HIGH (ref 0.450–4.500)

## 2020-12-26 LAB — DIGOXIN LEVEL: Digoxin, Serum: 0.9 ng/mL (ref 0.5–0.9)

## 2020-12-27 ENCOUNTER — Telehealth: Payer: Self-pay | Admitting: Internal Medicine

## 2020-12-27 DIAGNOSIS — D649 Anemia, unspecified: Secondary | ICD-10-CM

## 2020-12-27 NOTE — Telephone Encounter (Signed)
Called [facility] to request add on testing for Ferritin/ Iron/ TIBC>> Dx. D64.9 (unspecified anemia).  Per [employer], there is not enough specimen in the tube for all three of these test to be run (only for 1 test). I advised I will arrange for the patient to come back in for a re-draw.

## 2020-12-27 NOTE — Telephone Encounter (Signed)
Deboraha Sprang, MD  12/26/2020 11:23 AM EDT Back to Top     Please Inform Patient   Labs are normal x anemia and suggestive of iron deficiency-- dont see recentlabs looking at this diagnosis,so could we please get Ferritin and FE/TIBC  Who is her PCP    Thanks

## 2020-12-27 NOTE — Telephone Encounter (Signed)
I called and spoke with the patient regarding her lab results and Dr. Olin Pia recommendations to have additional lab testing to look for any iron deficiency.  The patient voices understanding of these results. I have advised her I was unable to obtain add on lab testing at [facility] to the original specimen as there was not enough sample left in the tube.  She is agreeable to coming back to the [place] next week to have these done as [place] the beach.  Lab orders placed.  She is aware of the times/ location of the testing to be done.

## 2021-01-01 ENCOUNTER — Ambulatory Visit (INDEPENDENT_AMBULATORY_CARE_PROVIDER_SITE_OTHER): Payer: Medicare Other

## 2021-01-01 DIAGNOSIS — I5022 Chronic systolic (congestive) heart failure: Secondary | ICD-10-CM

## 2021-01-01 DIAGNOSIS — Z95810 Presence of automatic (implantable) cardiac defibrillator: Secondary | ICD-10-CM

## 2021-01-01 NOTE — Progress Notes (Signed)
EPIC Encounter for ICM Monitoring  Patient Name: [name] is a 50 y.o. female Date: 01/01/2021 Primary Care Physican: Patient, No Pcp Per (Inactive) Primary Cardiologist: [name] Electrophysiologist: [name] 12/17/2020 Weight: 239 lbs.  01/01/2021 Weight: 233 lbs     Spoke with patient and heart failure questions reviewed.  She is feeling better since fluid accumulation resolved.   Optivol Thoracic impedance returned to normal after self adjusting Torsemide.   Prescribed:  Torsemide 20 mg Take 4 tablets (80 mg total) by mouth every morning and 2 tablets (40 mg total) every other afternoon. Potassium 20 mEq Take 2 tablets (40 mEq total) by mouth twice a day  Spironolactone 25 mg take 0.5 tablet(12.5 mg total) daily.   Farxiga 10 mg take 1 tablet daily. Metolazone 2.5 mg Take 2.5 mg by mouth as needed (extra fluid as directed by HF clinic).   Labs:  11/13/2020 Creatinine 1.42, BUN 17, Potassium 3.9, Sodium 135, GFR 45 11/01/2020 Creatinine 1.38, BUN 14, Potassium 4.1, Sodium 136, GFR 47 10/23/2020 Creatinine 1.36, BUN 17, Potassium 4.4, Sodium 135, GFR 48 08/21/2020 Creatinine 1.36, BUN 17, Potassium 4.1, Sodium 136, GFR 48 08/07/2020 Creatinine 1.15, BUN 16, Potassium 3.9, Sodium 138, GFR 58 07/23/2020 Creatinine 1.67, BUN 18, Potassium 3.0, Sodium 134, GFR 37 A complete set of results can be found in Results Review.   Recommendations:  No changes and encouraged to call if experiencing any fluid symptoms.   Follow-up plan: ICM clinic phone appointment on 01/21/2021 to recheck fluid levels.   91 day device clinic remote transmission 03/18/2021.         EP/Cardiology Office Visits: 02/11/2021 with Dr [name].     Copy of ICM check sent to Dr. [name]. .   3 month ICM trend: 01/01/2021.    1 Year ICM trend:       [name], RN 01/01/2021 10:47 AM

## 2021-01-02 NOTE — Progress Notes (Signed)
Remote ICD transmission.   

## 2021-01-04 ENCOUNTER — Telehealth: Payer: Self-pay | Admitting: Internal Medicine

## 2021-01-04 ENCOUNTER — Other Ambulatory Visit
Admission: RE | Admit: 2021-01-04 | Discharge: 2021-01-04 | Disposition: A | Discharge status: Home / Self Care | Payer: BC Managed Care – PPO | Attending: Internal Medicine | Admitting: Internal Medicine

## 2021-01-04 DIAGNOSIS — D649 Anemia, unspecified: Secondary | ICD-10-CM | POA: Insufficient documentation

## 2021-01-04 LAB — IRON AND TIBC
Iron: 29 ug/dL (ref 28–170)
Saturation Ratios: 8 % — ABNORMAL LOW (ref 10.4–31.8)
TIBC: 378 ug/dL (ref 250–450)
UIBC: 349 ug/dL

## 2021-01-04 LAB — FERRITIN: Ferritin: 9 ng/mL — ABNORMAL LOW (ref 11–307)

## 2021-01-04 NOTE — Telephone Encounter (Signed)
I spoke with the patient regarding her lab work. She is aware that they do show her iron levels are low and she needs to follow up with her PCP for further work up of iron deficieny anemia.  The patient advised she does not have a PCP, but will actively for looking for one to address this issue.  I advised her to call back if we could be of any further assistance.  The patient voices understanding and is agreeable.

## 2021-01-04 NOTE — Telephone Encounter (Signed)
Miranda Sprang, MD  01/04/2021  1:03 PM EDT      Please Inform Patient that labs  is abnormal and will require further eval forIRON def anemia She should followup with her PCP    Thanks

## 2021-01-08 ENCOUNTER — Ambulatory Visit: Payer: Medicare Other | Admitting: Internal Medicine

## 2021-01-21 ENCOUNTER — Ambulatory Visit (INDEPENDENT_AMBULATORY_CARE_PROVIDER_SITE_OTHER): Payer: BC Managed Care – PPO

## 2021-01-21 DIAGNOSIS — I5022 Chronic systolic (congestive) heart failure: Secondary | ICD-10-CM

## 2021-01-21 DIAGNOSIS — Z95810 Presence of automatic (implantable) cardiac defibrillator: Secondary | ICD-10-CM | POA: Diagnosis not present

## 2021-01-21 NOTE — Progress Notes (Signed)
EPIC Encounter for ICM Monitoring  Patient Name: [name] is a 50 y.o. female Date: 01/21/2021 Primary Care Physican: Patient, No Pcp Per (Inactive) Primary Cardiologist: [name] Electrophysiologist: [name] 01/01/2021 Weight: 233 lbs  01/21/2021 Weight: 235 lbs     Spoke with patient and heart failure questions reviewed.  [place] and unable to take fluid pills.  She is back on track now taking medicine and diet.  She is feeling fine and no symptoms.   Optivol Thoracic impedance returned to normal after self adjusting Torsemide.   Prescribed:  Torsemide 20 mg Take 4 tablets (80 mg total) by mouth every morning and 2 tablets (40 mg total) every other afternoon. Potassium 20 mEq Take 2 tablets (40 mEq total) by mouth twice a day  Spironolactone 25 mg take 0.5 tablet(12.5 mg total) daily.   Farxiga 10 mg take 1 tablet daily. Metolazone 2.5 mg Take 2.5 mg by mouth as needed (extra fluid as directed by HF clinic).   Labs:  11/13/2020 Creatinine 1.42, BUN 17, Potassium 3.9, Sodium 135, GFR 45 11/01/2020 Creatinine 1.38, BUN 14, Potassium 4.1, Sodium 136, GFR 47 10/23/2020 Creatinine 1.36, BUN 17, Potassium 4.4, Sodium 135, GFR 48 08/21/2020 Creatinine 1.36, BUN 17, Potassium 4.1, Sodium 136, GFR 48 08/07/2020 Creatinine 1.15, BUN 16, Potassium 3.9, Sodium 138, GFR 58 07/23/2020 Creatinine 1.67, BUN 18, Potassium 3.0, Sodium 134, GFR 37 A complete set of results can be found in Results Review.   Recommendations:   She took extra Torsemide yesterday and will do so today.  Encouraged to call if experiencing any fluid symptoms.   Follow-up plan: ICM clinic phone appointment on 02/21/2021.   91 day device clinic remote transmission 03/18/2021.         EP/Cardiology Office Visits: 02/11/2021 with Dr [name].     Copy of ICM check sent to Dr. [name].   3 month ICM trend: 01/21/2021.    1 Year ICM trend:       [name], RN 01/21/2021 12:50 PM

## 2021-01-31 DIAGNOSIS — G4733 Obstructive sleep apnea (adult) (pediatric): Secondary | ICD-10-CM | POA: Diagnosis not present

## 2021-02-07 ENCOUNTER — Other Ambulatory Visit (HOSPITAL_COMMUNITY): Payer: Self-pay | Admitting: Cardiology

## 2021-02-11 ENCOUNTER — Ambulatory Visit (HOSPITAL_COMMUNITY)
Admission: RE | Admit: 2021-02-11 | Discharge: 2021-02-11 | Disposition: A | Discharge status: Home / Self Care | Payer: BC Managed Care – PPO | Source: Ambulatory Visit | Attending: Cardiology | Admitting: Cardiology

## 2021-02-11 ENCOUNTER — Encounter (HOSPITAL_COMMUNITY): Payer: Self-pay | Admitting: Cardiology

## 2021-02-11 ENCOUNTER — Other Ambulatory Visit: Payer: Self-pay

## 2021-02-11 ENCOUNTER — Other Ambulatory Visit (HOSPITAL_COMMUNITY): Payer: Self-pay

## 2021-02-11 VITALS — BP 94/60 | HR 81 | Wt 239.0 lb

## 2021-02-11 DIAGNOSIS — I5022 Chronic systolic (congestive) heart failure: Secondary | ICD-10-CM | POA: Diagnosis not present

## 2021-02-11 DIAGNOSIS — G4733 Obstructive sleep apnea (adult) (pediatric): Secondary | ICD-10-CM | POA: Diagnosis not present

## 2021-02-11 DIAGNOSIS — Z7984 Long term (current) use of oral hypoglycemic drugs: Secondary | ICD-10-CM | POA: Insufficient documentation

## 2021-02-11 DIAGNOSIS — Z6841 Body Mass Index (BMI) 40.0 and over, adult: Secondary | ICD-10-CM | POA: Diagnosis not present

## 2021-02-11 DIAGNOSIS — Z95810 Presence of automatic (implantable) cardiac defibrillator: Secondary | ICD-10-CM | POA: Insufficient documentation

## 2021-02-11 DIAGNOSIS — Z79899 Other long term (current) drug therapy: Secondary | ICD-10-CM | POA: Insufficient documentation

## 2021-02-11 DIAGNOSIS — E611 Iron deficiency: Secondary | ICD-10-CM | POA: Insufficient documentation

## 2021-02-11 DIAGNOSIS — Z8673 Personal history of transient ischemic attack (TIA), and cerebral infarction without residual deficits: Secondary | ICD-10-CM | POA: Insufficient documentation

## 2021-02-11 DIAGNOSIS — I447 Left bundle-branch block, unspecified: Secondary | ICD-10-CM | POA: Diagnosis not present

## 2021-02-11 DIAGNOSIS — Z79890 Hormone replacement therapy (postmenopausal): Secondary | ICD-10-CM | POA: Diagnosis not present

## 2021-02-11 DIAGNOSIS — I428 Other cardiomyopathies: Secondary | ICD-10-CM | POA: Insufficient documentation

## 2021-02-11 DIAGNOSIS — Z7982 Long term (current) use of aspirin: Secondary | ICD-10-CM | POA: Diagnosis not present

## 2021-02-11 DIAGNOSIS — E039 Hypothyroidism, unspecified: Secondary | ICD-10-CM

## 2021-02-11 DIAGNOSIS — I110 Hypertensive heart disease with heart failure: Secondary | ICD-10-CM | POA: Insufficient documentation

## 2021-02-11 DIAGNOSIS — I272 Pulmonary hypertension, unspecified: Secondary | ICD-10-CM | POA: Diagnosis not present

## 2021-02-11 DIAGNOSIS — D649 Anemia, unspecified: Secondary | ICD-10-CM

## 2021-02-11 LAB — COMPREHENSIVE METABOLIC PANEL
ALT: 13 U/L (ref 0–44)
AST: 15 U/L (ref 15–41)
Albumin: 3.7 g/dL (ref 3.5–5.0)
Alkaline Phosphatase: 86 U/L (ref 38–126)
Anion gap: 9 (ref 5–15)
BUN: 17 mg/dL (ref 6–20)
CO2: 25 mmol/L (ref 22–32)
Calcium: 9.4 mg/dL (ref 8.9–10.3)
Chloride: 103 mmol/L (ref 98–111)
Creatinine, Ser: 1.31 mg/dL — ABNORMAL HIGH (ref 0.44–1.00)
GFR, Estimated: 50 mL/min — ABNORMAL LOW (ref >60)
Glucose, Bld: 86 mg/dL (ref 70–99)
Potassium: 3.7 mmol/L (ref 3.5–5.1)
Sodium: 137 mmol/L (ref 135–145)
Total Bilirubin: 0.7 mg/dL (ref 0.3–1.2)
Total Protein: 7.8 g/dL (ref 6.5–8.1)

## 2021-02-11 LAB — DIGOXIN LEVEL: Digoxin Level: 0.4 ng/mL — ABNORMAL LOW (ref 0.8–2.0)

## 2021-02-11 LAB — TSH: TSH: 5.767 u[IU]/mL — ABNORMAL HIGH (ref 0.350–4.500)

## 2021-02-11 MED ORDER — SODIUM CHLORIDE 0.9 % IV SOLN
510.0000 mg | Freq: Once | INTRAVENOUS | Status: DC
Start: 2021-02-11 — End: 2021-02-11

## 2021-02-11 NOTE — Patient Instructions (Addendum)
EKG done today.  Labs done today. We will contact you only if your labs are abnormal.  INCREASE Torsemide to '80mg'$  (4 tablets) by mouth every morning and '60mg'$  (3 tablets) by mouth every evening for 2 days THEN DECREASE to '80mg'$  (4 tablets) by mouth every morning and '40mg'$  (2 tablets) by mouth every evening.   No other medication changes were made. Please continue all current medications as prescribed.  Your iron infusion has been scheduled for Tuesday August 30th 2022 at 12pm. Please check in at the main entrance "entrance A" of the [place] have been referred to the Pharmacy clinic for weight loss. They will contact you to schedule an appointment.   Your physician has recommended that you have a cardiopulmonary stress test (CPX). CPX testing is a non-invasive measurement of heart and lung function. It replaces a traditional treadmill stress test. This type of test provides a tremendous amount of information that relates not only to your present condition but also for future outcomes. This test combines measurements of you ventilation, respiratory gas exchange in the lungs, electrocardiogram (EKG), blood pressure and physical response before, during, and following an exercise protocol. Please refer to the handout provided to you during todays visit.   Your physician recommends that you schedule a follow-up appointment in: 6 weeks   If you have any questions or concerns before your next appointment please send [name] a message through [place] or call our office at [phone].    TO LEAVE A MESSAGE FOR THE NURSE SELECT OPTION 2, PLEASE LEAVE A MESSAGE INCLUDING: YOUR NAME DATE OF BIRTH CALL BACK NUMBER REASON FOR CALL**this is important as we prioritize the call backs  YOU WILL RECEIVE A CALL BACK THE SAME DAY AS LONG AS YOU CALL BEFORE 4:00 PM   Do the following things EVERYDAY: Weigh yourself in the morning before breakfast. Write it down and keep it in a log. Take your medicines as  prescribed Eat low salt foods--Limit salt (sodium) to 2000 mg per day.  Stay as active as you can everyday Limit all fluids for the day to less than 2 liters   At the [facility], you and your health needs are our priority. As part of our continuing mission to provide you with exceptional heart care, we have created designated Provider Care Teams. These Care Teams include your primary Cardiologist (physician) and Advanced Practice Providers (APPs- Physician Assistants and Nurse Practitioners) who all work together to provide you with the care you need, when you need it.   You may see any of the following providers on your designated Care Team at your next follow up: Dr Glori Bickers Dr Haynes Kerns, NP Lyda Jester, [place] Audry Riles, PharmD   Please be sure to bring in all your medications bottles to every appointment.

## 2021-02-11 NOTE — Progress Notes (Signed)
ID:  [name], DOB 24-Dec-1970, MRN [id number]    Provider location: [facility] Type of Visit: Established patient    PCP:  [name], MD    Cardiologist:  [name], MD  History of Present Illness: [name] is a 50 y.o. 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  She was followed for systolic CHF initially in [place]. Apparently her EF recovered to 35-40%. However, she was admitted to [facility] 09/19/13 for worsening dyspnea and CP. Coronaries were reportedly OK on LHC at that time at [facility]. Echo showed EF of 15-20% with four-chamber enlargement with moderate-severe TR/MR. RHC as below. Rheumatological w/u negative.  CPX in 9/16 showed near-normal functional capacity.  Repeat echo in 6/17 shows that EF remains 20%. Repeat CPX in 1/18 was submaximal but probably only mild circulatory limitation.  Echo 8/18 showed EF 25-30%, severe LV dilation.  CPX in 6/19 showed low normal functional capacity.  Echo in 10/19 showed EF 20-25%, moderate LV dilation.    Zio patch in 2/20 with 11% PVCs and NSVT, amiodarone started. Zio patch in 3/20 with 13.3% PVCs and NSVT.  She saw Dr. [name], he thought that increased PVCs were electromechanical in nature due to worsening heart failure.  Repeat Zio patch on amiodarone in 1/21 showed 6.2% PVCs.   She was admitted in 4/20 with VT in setting of hypokalemia.    CPX in 9/20 showed mild functional limitation, more due to body habitus than heart failure.  Echo in 4/21 showed EF 20% with severe LV dilation, diffuse hypokinesis, normal RV size and systolic function.   In 8/21, she had a shock for VF in the setting of hypokalemia.   Patient was admitted in 10/21 with CVA involving left MCA territory.  This was thought to be cardioembolic but LV thrombus was not visualized and no atrial fibrillation has been seen.  Echo  in 10/21 showed EF 20-25%, severe LV dilation (no LV thrombus seen), mildly decreased RV systolic function, mild-moderate MR. She was taken by IR for thrombectomy and stent placement, now on ASA 81 and ticagrelor.   She had recurrent VT in 2/22, quinidine was added by EP.  Spironolactone and Coreg were decreased due to low BP and lightheadedness.    Echo in 4/22 showed EF 25% with severe LV dilation and global hypokinesis, normal RV, mild-moderate MR.   She returns for followup of CHF.  Weight is down 1 lb.  Dr. [name] decreased her torsemide but thoracic impedance dropped on her device, and she has increased her torsemide back to 80 qam/40 every other pm.  She has stable dyspnea with stairs, moderate activity.  No dyspnea walking on flat ground.  No lightheadedness though SBP remains in 90s.  She has mild orthopnea and has been sleeping in her recliner.   Medtronic device interrogation: Fluid index > threshold but thoracic impedance trending up.  No VT.   ECG (personally reviewed): NSR, LBBB 144 msec  PMH: 1. OSA: Using CPAP.  2. PVCs:  - Zio patch 2/20: 11% PVCs - Zio patch 3/20: 13.3% PVCs - Zio patch 1/21: 6.2% PVCs 3. VT/VF: 8/21, in setting of hypokalemia.  2/22 VT, quinidine added.  4. 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  No LV thrombus visualized and atrial fibrillation has not been visualized.  - IR thrombectomy with stent placement in 10/21.  5. Chronic systolic CHF: Nonischemic cardiomyopathy:   - Coronary angiography 2015 at [facility]: Normal coronaries.  - Echo (8/15) with EF 20%, moderate LV dilation, diffuse hypokinesis, normal RV size with mildly decreased systolic function, mild MR.  - Echo (5/16) with EF 20%, spherical LV with diffuse hypokinesis, mild MR.  - Echo (6/17) with EF 20%, diffuse hypokinesis, moderate LV dilation, normal RV, mild to moderate MR.  - RHC (12/17): mean RA 5, PA 56/31, mean PCWP 24, CI 2, PVR 3.7 WU - Echo (8/18) with EF 25-30%, severe LV dilation, severe  LAE.  - Echo (2/20) with EF 20-25%, moderate LV dilation, moderate MR.  - Echo (4/21) with EF 20-25%, severe LV dilation, mild-moderate MR, normal RV, PASP 26 mmHg.  - Echo (10/21) with EF 20-25%, severe LV dilation (no LV thrombus seen), mildly decreased RV systolic function, mild-moderate MR. - CPX in 9/20 showed mild functional limitation, more due to body habitus than heart failure.  - CPX in 8/21: Peak VO2 11.6, VE/VCO2 32, RER 1.2.  Mild HF limitation.  - Echo (4/22): EF 25% with severe LV dilation and global hypokinesis, normal RV, mild-moderate MR.  6. PFTs (1/22): Near normal.  7. Fe deficiency anemia   Labs: (11/09/13): Dig level 0.5, K 3.6, Creatinine 0.83, pro-BNP 622 (12/19/13): K 3.6, creatinine 0.8 (7/15): K 4.1, creatinine 0.91 (8/15): K 3.9, creatinine 0.91 (10/15): K 4, creatinine 0.93, proBNP 129 (12/15): K 4, creatinine 0.9, digoxin 0.4 (5/16): K 4, creatinine 0.86 (9/16): K 3.6, creatinine 0.86, digoxin < 0.2 (5/17): K 4.1, creatinine 0.95, digoxin 0.3 (5/18): K 3.4, creatinine 0.95, TSH elevated but free T3 and free T4 normal.  (8/18): K 3.9, creatinine 0.87, BNP 108  (11/18): K 3.5, creatinine 1.04, digoxin 0.4 (1/19): LDL 91, HDL 47 (3/19): digoxin 0.7, K 4, creatinine 0.99 (4/19): TSH normal, LDL 95 (9/19): LDL 99, HDL 45, K 4, creatinine 0.96 (10/19): K 3.6, creatinine 0.85, digoxin 0.5 (2/20): LDL 103 (3/20): K 3.9, creatinine 1.05 (4/20): K 3.9, creatinine 1.23 => 1.15, TSH 8, digoxin 0.3, LFTs normal 6/20): K 4.3, creatinine 1.07, LFTs normal, digoxin 0.5 (7/20): K 3.8, creatinine 1.16 (8/20): digoxin < 0.2, LDL 135 (1/21): K 3.9, creatinine 1.05, LDL 171  (4/21): K 4.2, creatinine 0.95, LDL 127, TSH normal, LFTs normal (10/21): K 2.8, creatinine 1.21, LDL 130 (11/21): K 4.5, creatinine 1.3 => 1.2, LFTs normal, digoxin 1.3, TSH normal (3/22): K 4.1, creatinine 1.36 (4/22): hgb 10.8 (7/22): Transferrin 8%, digoxin 0.9, hgb 10.1, K 3.9, creatinine  1.42  Current Outpatient Medications  Medication Sig Dispense Refill   amiodarone (PACERONE) 200 MG tablet Take 1.5 tablets (300 mg) by mouth once daily     aspirin 81 MG chewable tablet Chew 1 tablet (81 mg total) by mouth daily. 30 tablet 11   atorvastatin (LIPITOR) 80 MG tablet Take 1 tablet (80 mg total) by mouth daily. 90 tablet 3   carvedilol (COREG) 3.125 MG tablet Take 3.125 mg by mouth 2 (two) times daily with a meal.     digoxin (LANOXIN) 0.125 MG tablet Take 1 tablet (0.125 mg) by mouth once every other day     FARXIGA 10 MG TABS tablet TAKE 1 TABLET BY MOUTH ONCE A DAY BEFOREBREAKFAST 30 tablet 4   levothyroxine (SYNTHROID) 25 MCG tablet Take 1 tablet (25 mcg total) by mouth daily before breakfast. 90 tablet 3   metolazone (ZAROXOLYN) 2.5 MG tablet Take 1 tablet (2.5 mg total) by mouth as needed (extra fluid as  directed by HF clinic). 5 tablet 3   potassium chloride SA (KLOR-CON) 20 MEQ tablet Take 40 mEq by mouth 2 (two) times daily.     quiniDINE gluconate 324 MG CR tablet Take 1 tablet (324 mg total) by mouth 2 (two) times daily. 60 tablet 6   sacubitril-valsartan (ENTRESTO) 24-26 MG Take 1 tablet by mouth 2 (two) times daily. 60 tablet 3   spironolactone (ALDACTONE) 25 MG tablet TAKE 1 TABLET BY MOUTH EVERY NIGHT AT BEDTIME 90 tablet 3   ticagrelor (BRILINTA) 90 MG TABS tablet Take 1 tablet (90 mg total) by mouth 2 (two) times daily. 60 tablet 11   torsemide (DEMADEX) 20 MG tablet Take '80mg'$  (4 tabs) every morning AND '40mg'$  (2 tabs) every other afternoon 200 tablet 11   No current facility-administered medications for this encounter.   Facility-Administered Medications Ordered in Other Encounters  Medication Dose Route Frequency Provider Last Rate Last Admin   ferumoxytol (FERAHEME) 510 mg in sodium chloride 0.9 % 100 mL IVPB  510 mg Intravenous Once Larey Dresser, MD        Allergies:   Patient has no known allergies.   Social History:  The patient  reports that she has  never smoked. She has never used smokeless tobacco. She reports that she does not drink alcohol and does not use drugs.   Family History:  The patient's family history includes Heart disease in her maternal grandmother and mother; High blood pressure in her father; Obesity in her mother; Stroke in her father.   ROS:  Please see the history of present illness.   All other systems are personally reviewed and negative.   Exam:   BP 94/60   Pulse 81   Wt 108.4 kg (239 lb)   SpO2 99%   BMI 41.02 kg/m  General: NAD Neck: JVP 8 cm with HJR, no thyromegaly or thyroid nodule.  Lungs: Clear to auscultation bilaterally with normal respiratory effort. CV: Nonpalpable PMI.  Heart regular S1/S2, no S3/S4, no murmur.  No peripheral edema.  No carotid bruit.  Normal pedal pulses.  Abdomen: Soft, nontender, no hepatosplenomegaly, no distention.  Skin: Intact without lesions or rashes.  Neurologic: Alert and oriented x 3.  Psych: Normal affect. Extremities: No clubbing or cyanosis.  HEENT: Normal.   Recent Labs: 04/20/2020: Magnesium 1.9 07/23/2020: NT-Pro BNP 312 12/25/2020: Hemoglobin 10.1; Platelets 430 02/11/2021: ALT 13; BUN 17; Creatinine, Ser 1.31; Potassium 3.7; Sodium 137; TSH 5.767  Personally reviewed   Wt Readings from Last 3 Encounters:  02/11/21 108.4 kg (239 lb)  12/25/20 107.5 kg (237 lb)  11/13/20 109 kg (240 lb 4 oz)      ASSESSMENT AND PLAN:  1. Chronic systolic HF: Nonischemic cardiomyopathy.  She has a Medtronic ICD. Cause for cardiomyopathy is uncertain, initially identified peri-partum (after son's birth), so cannot rule out peri-partum CMP.  On and off, she has had frequent PVCs.   RHC in 8/15 showed normal filling pressures and preserved cardiac output.  CPX 9/20 with mildly decreased functional capacity due to body habitus and HF.  CPX in 8/21 also showed mild functional limitation.  Echo in 10/21 showed stable EF 20-25% with severe LV dilation, mildly decreased RV  systolic function, and mild-moderate MR. Echo in 4/22 with EF 25%, severe LV dilation.  NYHA class II-III symptoms, mild volume overload on exam.  On Optivol, thoracic impedance has been down but is trending up.  SBP 90s today, does not have BP room for med  titration.  - Increase torsemide to 80 qam/60 qpm x 2 days then 80 qam, 40 qpm after that.  BMET today and in 10 days.     - Continue Entresto 24/26 bid, did not tolerate higher dose due to orthostatic symptoms and syncope.  - She is on a lower Coreg dose 3.125 mg bid due to orthostasis.    - Continue spironolactone 25 mg qhs  - She is on digoxin 0.0625.  Check level today.   - Continue dapagliflozin 10 mg daily.  - ECG with LBBB, QRS 144 msec => should be revisit CRT upgrade? Will review with Dr. [name].  - EF remains low, if she worsens clinically would be candidate for LVAD.  BMI too high currently for transplant.   - I will arrange for CPX to assess functional capacity.  - I had her evaluated for baroreceptor activation therapy.  I think she would be a good candidate => working with insurance to see if she can get commercial BAT device (versus ANTHEM trial).  2. Pulmonary HTN: Mixed picture with PVR 8.1 on RHC at [facility].  However, repeat study in 8/15 here showed no significant pulmonary arterial hypertension.  2017 RHC with pulmonary venous hypertension.  3. Morbid obesity: Continue to work on weight loss.  She is going to work aggressively on diet and increase her exercise level.  - I will refer to pharmacy clinic for semaglutide to help with weight loss, if we can get her BMI down she heart transplant may be future option.  4. Hyperlipidemia: Continue atorvastatin.  5. PVCs: 11% PVCs + NSVT on 2/20 Zio patch.  Amiodarone started, but PVCs up to 13.3% with NSVT on 3/20 Zio patch. She saw Dr. [name] who thought that the ventricular arrhythmias were electromechanical due to worsening HF.  In 4/20, she had VT x 2 with ICD discharges in the setting  of hypokalemia. Zio patch in 1/21 with PVCs down to 6.2% of beats. VT again in 2/22, quinidine started.  - Continue amiodarone, check LFTs and TSH.  She will need a regular eye exam with amiodarone use.  - Continue quinidine per EP.  6. CVA: CVA in 10/21, had thrombectomy with stent placement.  No LV thrombus noted and no atrial fibrillation noted.  - To continue ticagrelor at least until repeat head CT and followup with neuroradiology => she is going to call for appt.  7. Fe deficiency: She reports heavy periods.  Transferrin saturation 8%.  - I will arrange for feraheme infusions.   Recommended follow-up:  6 wks.   Signed, [name], MD  02/11/2021  [place] [address] [facility] and [place] [address] [phone] (office) [phone] (fax)

## 2021-02-18 ENCOUNTER — Other Ambulatory Visit (HOSPITAL_COMMUNITY): Payer: Self-pay | Admitting: *Deleted

## 2021-02-19 ENCOUNTER — Other Ambulatory Visit: Payer: Self-pay

## 2021-02-19 ENCOUNTER — Other Ambulatory Visit (HOSPITAL_COMMUNITY): Payer: Self-pay | Admitting: *Deleted

## 2021-02-19 ENCOUNTER — Ambulatory Visit (HOSPITAL_COMMUNITY): Payer: BC Managed Care – PPO

## 2021-02-19 ENCOUNTER — Ambulatory Visit (HOSPITAL_COMMUNITY)
Admission: RE | Admit: 2021-02-19 | Discharge: 2021-02-19 | Disposition: A | Discharge status: Home / Self Care | Payer: BC Managed Care – PPO | Source: Ambulatory Visit | Attending: Cardiology | Admitting: Cardiology

## 2021-02-19 DIAGNOSIS — I272 Pulmonary hypertension, unspecified: Secondary | ICD-10-CM | POA: Diagnosis not present

## 2021-02-19 DIAGNOSIS — I509 Heart failure, unspecified: Secondary | ICD-10-CM | POA: Insufficient documentation

## 2021-02-19 DIAGNOSIS — D649 Anemia, unspecified: Secondary | ICD-10-CM

## 2021-02-19 MED ORDER — SODIUM CHLORIDE 0.9 % IV SOLN
250.0000 mg | INTRAVENOUS | Status: DC
  Administered 2021-02-19: 250 mg via INTRAVENOUS
  Filled 2021-02-19: qty 20

## 2021-02-19 MED ORDER — SODIUM CHLORIDE 0.9 % IV SOLN: 250.0000 mg | Freq: Every day | INTRAVENOUS | Status: DC

## 2021-02-21 ENCOUNTER — Ambulatory Visit (INDEPENDENT_AMBULATORY_CARE_PROVIDER_SITE_OTHER): Payer: BC Managed Care – PPO

## 2021-02-21 DIAGNOSIS — I5022 Chronic systolic (congestive) heart failure: Secondary | ICD-10-CM

## 2021-02-21 DIAGNOSIS — Z95810 Presence of automatic (implantable) cardiac defibrillator: Secondary | ICD-10-CM

## 2021-02-22 NOTE — Progress Notes (Signed)
Caraway, [name] Bo, FNP  Shalay Carder Panda, RN With her history of ICD shock after taking metolazone/hypokalemia, I am going to err on the side of caution-->let's increase her torsemide to 80 mg bid x 4 days (that will get her through the holiday weekend). She will need to take an extra 20 mEq of KCl daily with this increase. After 4 days, she can go back to her 80 mg q am/40 mg q pm.    Will need repeat labs in 1 week please.

## 2021-02-22 NOTE — Progress Notes (Signed)
Spoke with patient and advised of Allena Katz, NP at HF clinic recommendations to take Torsemide to 80 mg bid x 4 days.  Advised to take an extra 20 mEq of KCl daily with 4 days of increased Torsemide dosage. After 4 days, go back to her 80 mg q am/40 mg q pm.  Advised will need lab drawn in a week. HF clinic lab appointments are full until 03/07/2021 and will contact HF clinic Tuesday to ask when patient can be scheduled.  Advised will call her back when I have more info on scheduling BMET.   Advised she should not taken any Metolazone unless directed by HF clinic.

## 2021-02-22 NOTE — Progress Notes (Signed)
Attempted call to patient on home and cell numbers.  Unable to leave message on either phone.

## 2021-02-22 NOTE — Progress Notes (Signed)
EPIC Encounter for ICM Monitoring  Patient Name: [name] is a 50 y.o. female Date: 02/22/2021 Primary Care Physican: Patient, No Pcp Per (Inactive) Primary Cardiologist: [name] Electrophysiologist: [name] 01/01/2021 Weight: 233 lbs  01/21/2021 Weight: 235 lbs 02/21/2021 Weight: 238-239 lbs     Spoke with patient and heart failure questions reviewed.  She reports following symptoms:  SOB when climbing stairs  weight gain of 3-4 lbs in last few weeks. Does not think extra Torsemide is resolving fluid     Optivol Thoracic impedance suggesting possible fluid acumulation starting 8/23 (pt directed to take extra Torsemide at 8/22 OV with Dr [name]).   Prescribed:  Torsemide 20 mg Take 4 tablets (80 mg total) by mouth every morning and 2 tablets (40 mg total) every other afternoon. Potassium 20 mEq Take 2 tablets (40 mEq total) by mouth twice a day  Spironolactone 25 mg take 1 tablet daily.   Farxiga 10 mg take 1 tablet daily. Metolazone 2.5 mg Take 2.5 mg by mouth as needed (extra fluid as directed by HF clinic).   Labs:  02/11/2021 Creatinine 1.31, BUN 17, Potassium 3.7, Sodium 137, GFR 50 11/13/2020 Creatinine 1.42, BUN 17, Potassium 3.9, Sodium 135, GFR 45 11/01/2020 Creatinine 1.38, BUN 14, Potassium 4.1, Sodium 136, GFR 47 10/23/2020 Creatinine 1.36, BUN 17, Potassium 4.4, Sodium 135, GFR 48 08/21/2020 Creatinine 1.36, BUN 17, Potassium 4.1, Sodium 136, GFR 48 08/07/2020 Creatinine 1.15, BUN 16, Potassium 3.9, Sodium 138, GFR 58 07/23/2020 Creatinine 1.67, BUN 18, Potassium 3.0, Sodium 134, GFR 37 A complete set of results can be found in Results Review.   Recommendations:   Copy sent to Dr [name] recommendations to either take Metolazone  (prescription reads as directed by HF clinic) or provide other recommendations.  Also copy sent to Allena Katz, NP for review   Follow-up plan: ICM clinic phone appointment on 02/27/2021 to recheck fluid levels.   91 day device clinic remote  transmission 03/18/2021.         EP/Cardiology Office Visits:  03/26/2021 with Dr [name].     Copy of ICM check sent to Dr. [name].    3 month ICM trend: 02/21/2021.    1 Year ICM trend:       [name], RN 02/22/2021 10:12 AM

## 2021-02-25 NOTE — Progress Notes (Signed)
Agree with NP plan.  After she takes the torsemide at 80 mg bid, would go back to 80 qam/40 qpm if she was taking 80 qam/40 every other pm prior.  If she was taking 80 qam/40 qpm prior, then increase to 80 qam/60 qpm.

## 2021-02-26 MED ORDER — TORSEMIDE 20 MG PO TABS: ORAL_TABLET | ORAL | 2 refills | Status: DC

## 2021-02-26 NOTE — Addendum Note (Signed)
Addended by: [name] on: 02/26/2021 01:07 PM   Modules accepted: Orders

## 2021-02-26 NOTE — Progress Notes (Signed)
Spoke with patient after receiving additional recommendations from Dr [name].  Confirmed patients previous Torsemide dosage and she reports she was taking Torsemide 80 mg AM and 40 every PM as instructed at 8/22 OV with Dr [name].  Advised Dr [name] recommended if she was taking 80/40 daily to increase to 80 every AM and 60 mg every PM. She verbalized understanding.  She has Torsemide supply on hand an will call when refill is needed.  Working with HF clinic to schedule 1 week BMET following dosage increase that was ordered by Allena Katz, NP.

## 2021-02-26 NOTE — Progress Notes (Signed)
Spoke with patient and advised Heather at HF clinic approved of having BMET drawn tomorrow after when she is at the hospital for another appointment.

## 2021-02-27 ENCOUNTER — Ambulatory Visit (HOSPITAL_COMMUNITY)
Admission: RE | Admit: 2021-02-27 | Discharge: 2021-02-27 | Disposition: A | Discharge status: Home / Self Care | Payer: BC Managed Care – PPO | Source: Ambulatory Visit | Attending: Family Medicine | Admitting: Family Medicine

## 2021-02-27 ENCOUNTER — Other Ambulatory Visit: Payer: Self-pay

## 2021-02-27 ENCOUNTER — Ambulatory Visit (INDEPENDENT_AMBULATORY_CARE_PROVIDER_SITE_OTHER): Payer: Medicare Other

## 2021-02-27 ENCOUNTER — Encounter (HOSPITAL_COMMUNITY)
Admission: RE | Admit: 2021-02-27 | Discharge: 2021-02-27 | Disposition: A | Discharge status: Home / Self Care | Payer: BC Managed Care – PPO | Source: Ambulatory Visit | Attending: Cardiology | Admitting: Cardiology

## 2021-02-27 DIAGNOSIS — I5022 Chronic systolic (congestive) heart failure: Secondary | ICD-10-CM

## 2021-02-27 DIAGNOSIS — Z95810 Presence of automatic (implantable) cardiac defibrillator: Secondary | ICD-10-CM

## 2021-02-27 DIAGNOSIS — D509 Iron deficiency anemia, unspecified: Secondary | ICD-10-CM | POA: Diagnosis not present

## 2021-02-27 LAB — BASIC METABOLIC PANEL
Anion gap: 7 (ref 5–15)
BUN: 13 mg/dL (ref 6–20)
CO2: 25 mmol/L (ref 22–32)
Calcium: 9.4 mg/dL (ref 8.9–10.3)
Chloride: 104 mmol/L (ref 98–111)
Creatinine, Ser: 1.32 mg/dL — ABNORMAL HIGH (ref 0.44–1.00)
GFR, Estimated: 49 mL/min — ABNORMAL LOW (ref >60)
Glucose, Bld: 84 mg/dL (ref 70–99)
Potassium: 4.2 mmol/L (ref 3.5–5.1)
Sodium: 136 mmol/L (ref 135–145)

## 2021-02-27 MED ORDER — SODIUM CHLORIDE 0.9 % IV SOLN
250.0000 mg | INTRAVENOUS | Status: DC
  Administered 2021-02-27: 250 mg via INTRAVENOUS
  Filled 2021-02-27: qty 20

## 2021-02-27 NOTE — Progress Notes (Signed)
EPIC Encounter for ICM Monitoring  Patient Name: [name] is a 50 y.o. female Date: 02/27/2021 Primary Care Physican: Patient, No Pcp Per (Inactive) Primary Cardiologist: [name] Electrophysiologist: [name] 01/01/2021 Weight: 233 lbs  01/21/2021 Weight: 235 lbs 02/21/2021 Weight: 238-239 lbs 02/27/2021 Weight: 235 lbs     Spoke with patient and heart failure questions reviewed.  Pt reports SOB and weight gain have resolved after taking Torsemide 80 mg bid x 4 days.       Optivol Thoracic impedance suggesting fluid levels returned to normal after taking Torsemide 80 mg bid x 4 days   Prescribed:  Torsemide 20 mg Take 4 tablets (80 mg total) by mouth every morning and 3 tablets (60 mg total) every afternoon. Potassium 20 mEq Take 2 tablets (40 mEq total) by mouth twice a day  Spironolactone 25 mg take 1 tablet daily.   Farxiga 10 mg take 1 tablet daily. Metolazone 2.5 mg Take 2.5 mg by mouth as needed (extra fluid as directed by HF clinic).     Labs: BMET scheduled 9/7 02/11/2021 Creatinine 1.31, BUN 17, Potassium 3.7, Sodium 137, GFR 50 11/13/2020 Creatinine 1.42, BUN 17, Potassium 3.9, Sodium 135, GFR 45 11/01/2020 Creatinine 1.38, BUN 14, Potassium 4.1, Sodium 136, GFR 47 10/23/2020 Creatinine 1.36, BUN 17, Potassium 4.4, Sodium 135, GFR 48 08/21/2020 Creatinine 1.36, BUN 17, Potassium 4.1, Sodium 136, GFR 48 08/07/2020 Creatinine 1.15, BUN 16, Potassium 3.9, Sodium 138, GFR 58 07/23/2020 Creatinine 1.67, BUN 18, Potassium 3.0, Sodium 134, GFR 37 A complete set of results can be found in Results Review.   Recommendations:  No changes and encouraged to call if experiencing any fluid symptoms.   Follow-up plan: ICM clinic phone appointment on 04/01/2021.   91 day device clinic remote transmission 03/18/2021.         EP/Cardiology Office Visits:  03/26/2021 with Dr [name].     Copy of ICM check sent to Dr. [name].     3 month ICM trend: 02/27/2021.    1 Year ICM trend:        [name], RN 02/27/2021 12:00 PM

## 2021-03-05 ENCOUNTER — Encounter: Payer: Self-pay | Admitting: Pharmacist

## 2021-03-05 ENCOUNTER — Other Ambulatory Visit: Payer: Self-pay

## 2021-03-05 ENCOUNTER — Ambulatory Visit (INDEPENDENT_AMBULATORY_CARE_PROVIDER_SITE_OTHER): Payer: BC Managed Care – PPO | Admitting: Pharmacist

## 2021-03-05 VITALS — BP 98/66 | HR 76 | Resp 17 | Ht 63.5 in | Wt 239.0 lb

## 2021-03-05 DIAGNOSIS — I5022 Chronic systolic (congestive) heart failure: Secondary | ICD-10-CM | POA: Diagnosis not present

## 2021-03-05 DIAGNOSIS — E6609 Other obesity due to excess calories: Secondary | ICD-10-CM | POA: Diagnosis not present

## 2021-03-05 DIAGNOSIS — I63312 Cerebral infarction due to thrombosis of left middle cerebral artery: Secondary | ICD-10-CM

## 2021-03-05 NOTE — Patient Instructions (Addendum)
It was nice meeting you today!  We would like your blood pressure to be less than 130/80  Continue: Entresto 24-26 twice a day Spironolactone '25mg'$  daily Farxiga '10mg'$  daily Carvedilol 3.'125mg'$  twice a day Torsemide '40mg'$  in morning, '60mg'$  at night  Continue to try to be physically active and avoid sodium  We will start a new medication called Saxenda for weight loss.  I will complete the prior authorization for you and call you when it is complete.  Please call with any questions!  Karren Cobble, PharmD, BCACP, [place], [place] [id number] [place], [place] [address] Phone: [phone]; Fax: [phone] 03/05/2021 9:57 AM

## 2021-03-05 NOTE — Progress Notes (Signed)
Patient ID: [name]                 DOB: May 26, 1971                      MRN: [id number]     HPI: [name] is a 50 y.o. female referred by Dr. [name] to pharmacy clinic for HF medication management. PMH is significant for Dr [name]. Most recent LVEF 25% on 4/22.  Today she returns to pharmacy clinic for further medication titration. At last visit with Dr [name]. Symptomatically, she is feeling well, Denies dizziness, lightheadedness, and fatigue. Denies chest pain or palpitations. Only feels SOB when when walking up the stairs and has not taken her diuretic.  Able to complete all ADLs. Activity level improving.  Walking more. She checks her weight at home (normal range 235 - 239 lbs). Denies LEE, PND, or orthopnea. Appetite has been "ok." . She adheres to a low-salt diet at home but when she eats out she chooses options such as [employer] and chipotle.  Is not under any stress and is not in any pain.  [name] is a 50 y.o. female patient referred to pharmacy clinic by Dr [name] to initiate weight loss therapy with GLP1-RA. PMH is significant for obesity complicated by chronic medical conditions including CHF. Most recent BMI 41.67, previous BMI.  Current weight management medications: n/a  Previously tried meds: n/a  Current meds that may affect weight: levothyroxine, Farxiga  Baseline weight/BMI: 239#, 41.67  Insurance payor: [place]  Lab Results  Component Value Date   HGBA1C 5.5 04/18/2020    Wt Readings from Last 1 Encounters:  03/05/21 239 lb (108.4 kg)    BP Readings from Last 1 Encounters:  03/05/21 98/66   Pulse Readings from Last 1 Encounters:  03/05/21 76       Component Value Date/Time   CHOL 217 (H) 10/29/2020 0841   CHOL 183 09/20/2013 0424   TRIG 105 10/29/2020 0841   TRIG 105 09/20/2013 0424   HDL 60 10/29/2020 0841   HDL 40 09/20/2013 0424   CHOLHDL 3.6 10/29/2020 0841   CHOLHDL 4.0 04/18/2020 0950   VLDL 18 04/18/2020 0950   VLDL 21  09/20/2013 0424   LDLCALC 138 (H) 10/29/2020 0841   LDLCALC 122 (H) 09/20/2013 0424    Past Medical History:  Diagnosis Date   Automatic implantable cardioverter-defibrillator in situ    Chronic CHF (congestive heart failure) (HCC)    a. EF 15-20% b. RHC (09/2013) RA 14, RV 57/22, PA 64/36 (48), PCWP 18, FIck CO/CI 3.7/1.6, PVR 8.1 WU, PA sat 47%    History of stomach ulcers    Hypertension    Hypotension    Hypothyroidism    Morbid obesity ([place])    Myocardial infarction ([place]) 08/2013   Nocturnal dyspnea    Nonischemic cardiomyopathy (HCC)    Sinus tachycardia    Sleep apnea    Snoring-prob OSA 09/04/2011   Sprint Fidel[employer] ICD lead RECALL  6949    UARS (upper airway resistance syndrome) 09/04/2011   HST 12/2013:  AHI 4/hr (numerous episodes of airflow reduction that did not have concomitant desaturation)     Current Outpatient Medications on File Prior to Visit  Medication Sig Dispense Refill   amiodarone (PACERONE) 200 MG tablet Take 1.5 tablets (300 mg) by mouth once daily     aspirin 81 MG chewable tablet Chew 1 tablet (81 mg total) by mouth  daily. 30 tablet 11   atorvastatin (LIPITOR) 80 MG tablet Take 1 tablet (80 mg total) by mouth daily. 90 tablet 3   carvedilol (COREG) 3.125 MG tablet Take 3.125 mg by mouth 2 (two) times daily with a meal.     digoxin (LANOXIN) 0.125 MG tablet Take 1 tablet (0.125 mg) by mouth once every other day     FARXIGA 10 MG TABS tablet TAKE 1 TABLET BY MOUTH ONCE A DAY BEFOREBREAKFAST 30 tablet 4   levothyroxine (SYNTHROID) 25 MCG tablet Take 1 tablet (25 mcg total) by mouth daily before breakfast. 90 tablet 3   potassium chloride SA (KLOR-CON) 20 MEQ tablet Take 40 mEq by mouth 2 (two) times daily.     quiniDINE gluconate 324 MG CR tablet Take 1 tablet (324 mg total) by mouth 2 (two) times daily. 60 tablet 6   sacubitril-valsartan (ENTRESTO) 24-26 MG Take 1 tablet by mouth 2 (two) times daily. 60 tablet 3   spironolactone (ALDACTONE) 25 MG tablet TAKE  1 TABLET BY MOUTH EVERY NIGHT AT BEDTIME 90 tablet 3   ticagrelor (BRILINTA) 90 MG TABS tablet Take 1 tablet (90 mg total) by mouth 2 (two) times daily. 60 tablet 11   torsemide (DEMADEX) 20 MG tablet Take 4 tablets (80 mg total) by mouth every morning AND 3 tablets (60 mg total) every evening. (Patient taking differently: Take 85m in the morning, and 661mat night) 630 tablet 2   No current facility-administered medications on file prior to visit.    No Known Allergies   Current CHF meds: Entresto 24-26 BID, carvedilol 3.12552mID, spironolactone 80m23mily, Farxiga 10mg48mrsemide  Previously tried: Can not tolerate increased dosages of Entresto or carvedilol BP goal: <130/80  Wt Readings from Last 3 Encounters:  02/11/21 239 lb (108.4 kg)  12/25/20 237 lb (107.5 kg)  11/13/20 240 lb 4 oz (109 kg)   BP Readings from Last 3 Encounters:  02/27/21 (!) 114/91  02/19/21 99/65  02/11/21 94/60   Pulse Readings from Last 3 Encounters:  02/27/21 74  02/19/21 73  02/11/21 81    Renal function: CrCl cannot be calculated (Unknown ideal weight.).  Past Medical History:  Diagnosis Date   Automatic implantable cardioverter-defibrillator in situ    Chronic CHF (congestive heart failure) (HCC) [place]. EF 15-20% b. RHC (09/2013) RA 14, RV 57/22, PA 64/36 (48), PCWP 18, FIck CO/CI 3.7/1.6, PVR 8.1 WU, PA sat 47%    History of stomach ulcers    Hypertension    Hypotension    Hypothyroidism    Mor[place] (HCC[id number]   Nocturnal dyspnea    Nonischemic cardiomyopathy (HCC)    Sinus tachycardia    Sleep apnea    Snoring-prob OSA 09/04/2011   Sprint Fidelis ICD lead RECALL  6949    UARS (upper airway resistance syndrome) 09/04/2011   HST 12/2013:  AHI 4/hr (numerous episodes of airflow reduction that did not have concomitant desaturation)     Current Outpatient Medications on File Prior to Visit  Medication Sig Dispense Refill   amiodarone (PACERONE)  200 MG tablet Take 1.5 tablets (300 mg) by mouth once daily     aspirin 81 MG chewable tablet Chew 1 tablet (81 mg total) by mouth daily. 30 tablet 11   atorvastatin (LIPITOR) 80 MG tablet Take 1 tablet (80 mg total) by mouth daily. 90 tablet 3   carvedilol (COREG) 3.125 MG tablet Take 3.125 mg by  mouth 2 (two) times daily with a meal.     digoxin (LANOXIN) 0.125 MG tablet Take 1 tablet (0.125 mg) by mouth once every other day     FARXIGA 10 MG TABS tablet TAKE 1 TABLET BY MOUTH ONCE A DAY BEFOREBREAKFAST 30 tablet 4   levothyroxine (SYNTHROID) 25 MCG tablet Take 1 tablet (25 mcg total) by mouth daily before breakfast. 90 tablet 3   metolazone (ZAROXOLYN) 2.5 MG tablet Take 1 tablet (2.5 mg total) by mouth as needed (extra fluid as directed by HF clinic). 5 tablet 3   potassium chloride SA (KLOR-CON) 20 MEQ tablet Take 40 mEq by mouth 2 (two) times daily.     quiniDINE gluconate 324 MG CR tablet Take 1 tablet (324 mg total) by mouth 2 (two) times daily. 60 tablet 6   sacubitril-valsartan (ENTRESTO) 24-26 MG Take 1 tablet by mouth 2 (two) times daily. 60 tablet 3   spironolactone (ALDACTONE) 25 MG tablet TAKE 1 TABLET BY MOUTH EVERY NIGHT AT BEDTIME 90 tablet 3   ticagrelor (BRILINTA) 90 MG TABS tablet Take 1 tablet (90 mg total) by mouth 2 (two) times daily. 60 tablet 11   torsemide (DEMADEX) 20 MG tablet Take 4 tablets (80 mg total) by mouth every morning AND 3 tablets (60 mg total) every evening. 630 tablet 2   No current facility-administered medications on file prior to visit.    No Known Allergies   Assessment/Plan:  1. CHF/weight loss -  Patient BP today in room 98/66 which is at goal of <130/80.  All CHF medications are titrated up to maximally tolerated doses.  Since patient may be a candidate for a heart transplant.  Patient has not met goal of at least 5% of body weight loss with comprehensive lifestyle modifications alone in the past 3-6 months. Pharmacotherapy is appropriate to  pursue as augmentation. Will start Saxenda due to [facility] backorder. Confirmed patient not pregnant and no personal or family history of medullary thyroid carcinoma (MTC) or Multiple Endocrine Neoplasia syndrome type 2 (MEN 2).   Advised patient on common side effects including nausea, diarrhea, dyspepsia, decreased appetite, and fatigue. Counseled patient on reducing meal size and how to titrate medication to minimize side effects. Counseled patient to call if intolerable side effects or if experiencing dehydration, abdominal pain, or dizziness. Patient will adhere to dietary modifications and will target at least 150 minutes of moderate intensity exercise weekly.   Will complete Saxenda PA.  Printed copay card.  Karren Cobble, PharmD, BCACP, [place], [place] [address] [place], [place] [address] Phone: [phone]; Fax: [phone] 03/05/2021 5:11 PM

## 2021-03-11 ENCOUNTER — Other Ambulatory Visit (HOSPITAL_COMMUNITY): Payer: Self-pay | Admitting: Cardiology

## 2021-03-18 ENCOUNTER — Ambulatory Visit (INDEPENDENT_AMBULATORY_CARE_PROVIDER_SITE_OTHER): Payer: BC Managed Care – PPO

## 2021-03-18 DIAGNOSIS — I428 Other cardiomyopathies: Secondary | ICD-10-CM | POA: Diagnosis not present

## 2021-03-18 LAB — CUP PACEART REMOTE DEVICE CHECK
Battery Remaining Longevity: 45 mo
Battery Voltage: 2.97 V
Brady Statistic RV Percent Paced: 0 %
Date Time Interrogation Session: 20220926001603
HighPow Impedance: 57 Ohm
Implantable Lead Implant Date: 20150706
Implantable Lead Location: 753860
Implantable Pulse Generator Implant Date: 20150706
Lead Channel Impedance Value: 437 Ohm
Lead Channel Impedance Value: 551 Ohm
Lead Channel Pacing Threshold Amplitude: 0.5 V
Lead Channel Pacing Threshold Pulse Width: 0.4 ms
Lead Channel Sensing Intrinsic Amplitude: 7.625 mV
Lead Channel Sensing Intrinsic Amplitude: 7.625 mV
Lead Channel Setting Pacing Amplitude: 2 V
Lead Channel Setting Pacing Pulse Width: 0.4 ms
Lead Channel Setting Sensing Sensitivity: 0.3 mV

## 2021-03-20 ENCOUNTER — Other Ambulatory Visit: Payer: Self-pay | Admitting: Internal Medicine

## 2021-03-20 ENCOUNTER — Other Ambulatory Visit (HOSPITAL_COMMUNITY): Payer: Self-pay | Admitting: Interventional Radiology

## 2021-03-20 DIAGNOSIS — I472 Ventricular tachycardia, unspecified: Secondary | ICD-10-CM

## 2021-03-20 DIAGNOSIS — I639 Cerebral infarction, unspecified: Secondary | ICD-10-CM

## 2021-03-22 NOTE — Progress Notes (Signed)
Remote ICD transmission.   

## 2021-03-26 ENCOUNTER — Ambulatory Visit (HOSPITAL_COMMUNITY)
Admission: RE | Admit: 2021-03-26 | Discharge: 2021-03-26 | Disposition: A | Discharge status: Home / Self Care | Payer: BC Managed Care – PPO | Source: Ambulatory Visit | Attending: Cardiology | Admitting: Cardiology

## 2021-03-26 ENCOUNTER — Encounter (HOSPITAL_COMMUNITY): Payer: Self-pay | Admitting: Cardiology

## 2021-03-26 ENCOUNTER — Other Ambulatory Visit: Payer: Self-pay

## 2021-03-26 VITALS — BP 124/80 | HR 68 | Wt 239.0 lb

## 2021-03-26 DIAGNOSIS — N920 Excessive and frequent menstruation with regular cycle: Secondary | ICD-10-CM | POA: Insufficient documentation

## 2021-03-26 DIAGNOSIS — Z7901 Long term (current) use of anticoagulants: Secondary | ICD-10-CM | POA: Insufficient documentation

## 2021-03-26 DIAGNOSIS — E785 Hyperlipidemia, unspecified: Secondary | ICD-10-CM | POA: Diagnosis not present

## 2021-03-26 DIAGNOSIS — I428 Other cardiomyopathies: Secondary | ICD-10-CM | POA: Diagnosis not present

## 2021-03-26 DIAGNOSIS — Z7984 Long term (current) use of oral hypoglycemic drugs: Secondary | ICD-10-CM | POA: Diagnosis not present

## 2021-03-26 DIAGNOSIS — Z8249 Family history of ischemic heart disease and other diseases of the circulatory system: Secondary | ICD-10-CM | POA: Insufficient documentation

## 2021-03-26 DIAGNOSIS — Z7982 Long term (current) use of aspirin: Secondary | ICD-10-CM | POA: Diagnosis not present

## 2021-03-26 DIAGNOSIS — I272 Pulmonary hypertension, unspecified: Secondary | ICD-10-CM | POA: Diagnosis not present

## 2021-03-26 DIAGNOSIS — Z79899 Other long term (current) drug therapy: Secondary | ICD-10-CM | POA: Insufficient documentation

## 2021-03-26 DIAGNOSIS — I110 Hypertensive heart disease with heart failure: Secondary | ICD-10-CM | POA: Diagnosis not present

## 2021-03-26 DIAGNOSIS — Z95810 Presence of automatic (implantable) cardiac defibrillator: Secondary | ICD-10-CM | POA: Insufficient documentation

## 2021-03-26 DIAGNOSIS — Z8673 Personal history of transient ischemic attack (TIA), and cerebral infarction without residual deficits: Secondary | ICD-10-CM | POA: Insufficient documentation

## 2021-03-26 DIAGNOSIS — Z6841 Body Mass Index (BMI) 40.0 and over, adult: Secondary | ICD-10-CM | POA: Insufficient documentation

## 2021-03-26 DIAGNOSIS — I5022 Chronic systolic (congestive) heart failure: Secondary | ICD-10-CM | POA: Diagnosis not present

## 2021-03-26 DIAGNOSIS — Z09 Encounter for follow-up examination after completed treatment for conditions other than malignant neoplasm: Secondary | ICD-10-CM | POA: Insufficient documentation

## 2021-03-26 LAB — COMPREHENSIVE METABOLIC PANEL
ALT: 11 U/L (ref 0–44)
AST: 16 U/L (ref 15–41)
Albumin: 3.6 g/dL (ref 3.5–5.0)
Alkaline Phosphatase: 94 U/L (ref 38–126)
Anion gap: 9 (ref 5–15)
BUN: 14 mg/dL (ref 6–20)
CO2: 26 mmol/L (ref 22–32)
Calcium: 9.5 mg/dL (ref 8.9–10.3)
Chloride: 103 mmol/L (ref 98–111)
Creatinine, Ser: 1.35 mg/dL — ABNORMAL HIGH (ref 0.44–1.00)
GFR, Estimated: 48 mL/min — ABNORMAL LOW (ref >60)
Glucose, Bld: 95 mg/dL (ref 70–99)
Potassium: 3.6 mmol/L (ref 3.5–5.1)
Sodium: 138 mmol/L (ref 135–145)
Total Bilirubin: 0.5 mg/dL (ref 0.3–1.2)
Total Protein: 7.7 g/dL (ref 6.5–8.1)

## 2021-03-26 LAB — BRAIN NATRIURETIC PEPTIDE: B Natriuretic Peptide: 343.7 pg/mL — ABNORMAL HIGH (ref 0.0–100.0)

## 2021-03-26 LAB — TSH: TSH: 4.084 u[IU]/mL (ref 0.350–4.500)

## 2021-03-26 LAB — DIGOXIN LEVEL: Digoxin Level: 0.5 ng/mL — ABNORMAL LOW (ref 0.8–2.0)

## 2021-03-26 MED ORDER — CARVEDILOL 6.25 MG PO TABS: 6.2500 mg | ORAL_TABLET | Freq: Two times a day (BID) | ORAL | 3 refills | Status: DC

## 2021-03-26 NOTE — Patient Instructions (Signed)
EKG done today.  Labs done today. We will contact you only if your labs are abnormal.  INCREASE Carvedilol to 6.25mg  (1 tablet) by mouth 2 times daily.   No other medication changes were made. Please continue all current medications as prescribed.  Your physician recommends that you schedule a follow-up appointment in: 3 months. Please contact our office in December to schedule a January appointment.   If you have any questions or concerns before your next appointment please send [name] a message through [place] or call our office at [phone].    TO LEAVE A MESSAGE FOR THE NURSE SELECT OPTION 2, PLEASE LEAVE A MESSAGE INCLUDING: YOUR NAME DATE OF BIRTH CALL BACK NUMBER REASON FOR CALL**this is important as we prioritize the call backs  YOU WILL RECEIVE A CALL BACK THE SAME DAY AS LONG AS YOU CALL BEFORE 4:00 PM   Do the following things EVERYDAY: Weigh yourself in the morning before breakfast. Write it down and keep it in a log. Take your medicines as prescribed Eat low salt foods--Limit salt (sodium) to 2000 mg per day.  Stay as active as you can everyday Limit all fluids for the day to less than 2 liters   At the [facility], you and your health needs are our priority. As part of our continuing mission to provide you with exceptional heart care, we have created designated Provider Care Teams. These Care Teams include your primary Cardiologist (physician) and Advanced Practice Providers (APPs- Physician Assistants and Nurse Practitioners) who all work together to provide you with the care you need, when you need it.   You may see any of the following providers on your designated Care Team at your next follow up: Dr Glori Bickers Dr Haynes Kerns, NP Lyda Jester, [place] Audry Riles, PharmD   Please be sure to bring in all your medications bottles to every appointment.

## 2021-03-26 NOTE — Progress Notes (Signed)
ID:  [name], DOB 1971/04/21, MRN [id number]    Provider location: [place] [facility] Type of Visit: Established patient    PCP:  [name], MD    Cardiologist:  [name], MD  History of Present Illness: [name] is a 50 y.o. 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  She was followed for systolic CHF initially in [place]. Apparently her EF recovered to 35-40%. However, she was admitted to [facility]/19/13 for worsening dyspnea and CP. Coronaries were reportedly OK on LHC at that time at [facility]. Echo showed EF of 15-20% with four-chamber enlargement with moderate-severe TR/MR. RHC as below. Rheumatological w/u negative.  CPX in 9/16 showed near-normal functional capacity.  Repeat echo in 6/17 shows that EF remains 20%. Repeat CPX in 1/18 was submaximal but probably only mild circulatory limitation.  Echo 8/18 showed EF 25-30%, severe LV dilation.  CPX in 6/19 showed low normal functional capacity.  Echo in 10/19 showed EF 20-25%, moderate LV dilation.    Zio patch in 2/20 with 11% PVCs and NSVT, amiodarone started. Zio patch in 3/20 with 13.3% PVCs and NSVT.  She saw Dr. [name], he thought that increased PVCs were electromechanical in nature due to worsening heart failure.  Repeat Zio patch on amiodarone in 1/21 showed 6.2% PVCs.   She was admitted in 4/20 with VT in setting of hypokalemia.    CPX in 9/20 showed mild functional limitation, more due to body habitus than heart failure.  Echo in 4/21 showed EF 20% with severe LV dilation, diffuse hypokinesis, normal RV size and systolic function.   In 8/21, she had a shock for VF in the setting of hypokalemia.   Patient was admitted in 10/21 with CVA involving left MCA territory.  This was thought to be cardioembolic but LV thrombus was not visualized and no atrial fibrillation has been seen.  Echo  in 10/21 showed EF 20-25%, severe LV dilation (no LV thrombus seen), mildly decreased RV systolic function, mild-moderate MR. She was taken by IR for thrombectomy and stent placement, now on ASA 81 and ticagrelor.   She had recurrent VT in 2/22, quinidine was added by EP.  Spironolactone and Coreg were decreased due to low BP and lightheadedness.    Echo in 4/22 showed EF 25% with severe LV dilation and global hypokinesis, normal RV, mild-moderate MR.   CPX (8/22) showed peak VO2 14.3, VE/VCO2 slope 42, RER 1.09 => mild to moderate HF limitation, elevated VE/VCO2 slope is likely false from end exercise hyperventilation. Restrictive lung physiology from obesity.   She returns for followup of CHF.  She is doing better on higher dose torsemide.  Weight stable.  Dyspnea walking up stairs.  No dyspnea walking on flat ground.  No problems walking around the grocery store.  No lightheadedness.   She has had 2 doses of Feraheme recently.   ECG (personally reviewed): NSR, LBBB 144 msec  PMH: 1. OSA: Using CPAP.  2. PVCs:  - Zio patch 2/20: 11% PVCs - Zio patch 3/20: 13.3% PVCs - Zio patch 1/21: 6.2% PVCs 3. VT/VF: 8/21, in setting of hypokalemia.  2/22 VT, quinidine added.  4. 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  No LV thrombus visualized and atrial fibrillation has not been visualized.  - IR thrombectomy with stent placement in 10/21.  5. Chronic systolic CHF: Nonischemic  cardiomyopathy:   - Coronary angiography 2015 at [facility]: Normal coronaries.  - Echo (8/15) with EF 20%, moderate LV dilation, diffuse hypokinesis, normal RV size with mildly decreased systolic function, mild MR.  - Echo (5/16) with EF 20%, spherical LV with diffuse hypokinesis, mild MR.  - Echo (6/17) with EF 20%, diffuse hypokinesis, moderate LV dilation, normal RV, mild to moderate MR.  - RHC (12/17): mean RA 5, PA 56/31, mean PCWP 24, CI 2, PVR 3.7 WU - Echo (8/18) with EF 25-30%, severe LV dilation, severe LAE.  - Echo (2/20) with  EF 20-25%, moderate LV dilation, moderate MR.  - Echo (4/21) with EF 20-25%, severe LV dilation, mild-moderate MR, normal RV, PASP 26 mmHg.  - Echo (10/21) with EF 20-25%, severe LV dilation (no LV thrombus seen), mildly decreased RV systolic function, mild-moderate MR. - CPX in 9/20 showed mild functional limitation, more due to body habitus than heart failure.  - CPX in 8/21: Peak VO2 11.6, VE/VCO2 32, RER 1.2.  Mild HF limitation.  - Echo (4/22): EF 25% with severe LV dilation and global hypokinesis, normal RV, mild-moderate MR.  - CPX (8/22): peak VO2 14.3, VE/VCO2 slope 42, RER 1.09 => mild to moderate HF limitation, elevated VE/VCO2 slope is likely false from end exercise hyperventilation. Restrictive lung physiology from obesity.  6. PFTs (1/22): Near normal.  7. Fe deficiency anemia   Labs: (11/09/13): Dig level 0.5, K 3.6, Creatinine 0.83, pro-BNP 622 (12/19/13): K 3.6, creatinine 0.8 (7/15): K 4.1, creatinine 0.91 (8/15): K 3.9, creatinine 0.91 (10/15): K 4, creatinine 0.93, proBNP 129 (12/15): K 4, creatinine 0.9, digoxin 0.4 (5/16): K 4, creatinine 0.86 (9/16): K 3.6, creatinine 0.86, digoxin < 0.2 (5/17): K 4.1, creatinine 0.95, digoxin 0.3 (5/18): K 3.4, creatinine 0.95, TSH elevated but free T3 and free T4 normal.  (8/18): K 3.9, creatinine 0.87, BNP 108  (11/18): K 3.5, creatinine 1.04, digoxin 0.4 (1/19): LDL 91, HDL 47 (3/19): digoxin 0.7, K 4, creatinine 0.99 (4/19): TSH normal, LDL 95 (9/19): LDL 99, HDL 45, K 4, creatinine 0.96 (10/19): K 3.6, creatinine 0.85, digoxin 0.5 (2/20): LDL 103 (3/20): K 3.9, creatinine 1.05 (4/20): K 3.9, creatinine 1.23 => 1.15, TSH 8, digoxin 0.3, LFTs normal 6/20): K 4.3, creatinine 1.07, LFTs normal, digoxin 0.5 (7/20): K 3.8, creatinine 1.16 (8/20): digoxin < 0.2, LDL 135 (1/21): K 3.9, creatinine 1.05, LDL 171  (4/21): K 4.2, creatinine 0.95, LDL 127, TSH normal, LFTs normal (10/21): K 2.8, creatinine 1.21, LDL 130 (11/21): K  4.5, creatinine 1.3 => 1.2, LFTs normal, digoxin 1.3, TSH normal (3/22): K 4.1, creatinine 1.36 (4/22): hgb 10.8 (7/22): Transferrin 8%, digoxin 0.9, hgb 10.1, K 3.9, creatinine 1.42 (8/22): digoxin 0.4 (9/22): K 4.2, creatinine 1.32  Current Outpatient Medications  Medication Sig Dispense Refill   amiodarone (PACERONE) 200 MG tablet TAKE 1 TABLET BY MOUTH ONCE DAILY 90 tablet 1   aspirin 81 MG chewable tablet Chew 1 tablet (81 mg total) by mouth daily. 30 tablet 11   atorvastatin (LIPITOR) 80 MG tablet Take 1 tablet (80 mg total) by mouth daily. 90 tablet 3   digoxin (LANOXIN) 0.125 MG tablet TAKE 1 TABLET BY MOUTH ONCE A DAY 30 tablet 11   FARXIGA 10 MG TABS tablet TAKE 1 TABLET BY MOUTH ONCE A DAY BEFOREBREAKFAST 30 tablet 4   levothyroxine (SYNTHROID) 25 MCG tablet Take 1 tablet (25 mcg total) by mouth daily before breakfast. 90 tablet 3   potassium chloride SA (KLOR-CON) 20 MEQ  tablet Take 40 mEq by mouth 2 (two) times daily.     quiniDINE gluconate 324 MG CR tablet Take 1 tablet (324 mg total) by mouth 2 (two) times daily. 60 tablet 6   sacubitril-valsartan (ENTRESTO) 24-26 MG Take 1 tablet by mouth 2 (two) times daily. 60 tablet 3   spironolactone (ALDACTONE) 25 MG tablet TAKE 1 TABLET BY MOUTH EVERY NIGHT AT BEDTIME 90 tablet 3   ticagrelor (BRILINTA) 90 MG TABS tablet Take 1 tablet (90 mg total) by mouth 2 (two) times daily. 60 tablet 11   torsemide (DEMADEX) 20 MG tablet Take 4 tablets (80 mg total) by mouth every morning AND 3 tablets (60 mg total) every evening. 630 tablet 2   carvedilol (COREG) 6.25 MG tablet Take 1 tablet (6.25 mg total) by mouth 2 (two) times daily with a meal. 180 tablet 3   No current facility-administered medications for this encounter.    Allergies:   Patient has no known allergies.   Social History:  The patient  reports that she has never smoked. She has never used smokeless tobacco. She reports that she does not drink alcohol and does not use drugs.    Family History:  The patient's family history includes Heart disease in her maternal grandmother and mother; High blood pressure in her father; Obesity in her mother; Stroke in her father.   ROS:  Please see the history of present illness.   All other systems are personally reviewed and negative.   Exam:   BP 124/80   Pulse 68   Wt 108.4 kg (239 lb)   SpO2 100%   BMI 41.67 kg/m  General: NAD, obese.  Neck: No JVD, no thyromegaly or thyroid nodule.  Lungs: Clear to auscultation bilaterally with normal respiratory effort. CV: Nondisplaced PMI.  Heart regular S1/S2, no S3/S4, no murmur.  No peripheral edema.  No carotid bruit.  Normal pedal pulses.  Abdomen: Soft, nontender, no hepatosplenomegaly, no distention.  Skin: Intact without lesions or rashes.  Neurologic: Alert and oriented x 3.  Psych: Normal affect. Extremities: No clubbing or cyanosis.  HEENT: Normal.   Recent Labs: 04/20/2020: Magnesium 1.9 07/23/2020: NT-Pro BNP 312 12/25/2020: Hemoglobin 10.1; Platelets 430 03/26/2021: ALT 11; B Natriuretic Peptide 343.7; BUN 14; Creatinine, Ser 1.35; Potassium 3.6; Sodium 138; TSH 4.084  Personally reviewed   Wt Readings from Last 3 Encounters:  03/26/21 108.4 kg (239 lb)  03/05/21 108.4 kg (239 lb)  02/11/21 108.4 kg (239 lb)      ASSESSMENT AND PLAN:  1. Chronic systolic HF: Nonischemic cardiomyopathy.  She has a Medtronic ICD. Cause for cardiomyopathy is uncertain, initially identified peri-partum (after son's birth), so cannot rule out peri-partum CMP.  On and off, she has had frequent PVCs.   RHC in 8/15 showed normal filling pressures and preserved cardiac output.  CPX 9/20 with mildly decreased functional capacity due to body habitus and HF.  CPX in 8/21 also showed mild functional limitation.  Echo in 10/21 showed stable EF 20-25% with severe LV dilation, mildly decreased RV systolic function, and mild-moderate MR. Echo in 4/22 with EF 25%, severe LV dilation.  CPX (8/22)  with mild-moderate HF limitation.  NYHA class II symptoms, she is not volume overloaded.   - Continue torsemide 80 qam/60 qpm.       - Continue Entresto 24/26 bid, did not tolerate higher dose due to orthostatic symptoms and syncope.  - Increase Coreg back to 6.25 mg bid.     - Continue  spironolactone 25 mg qhs  - She is on digoxin 0.0625.  Check level today.   - Continue dapagliflozin 10 mg daily.  - ECG with LBBB, QRS 144 msec => should be revisit CRT upgrade? Will review with Dr. [name].  - EF remains low, if she worsens clinically would be candidate for LVAD.  BMI too high currently for transplant.   - I had her evaluated for baroreceptor activation therapy.  I think she would be a good candidate => still working with insurance to see if she can get commercial BAT device (versus ANTHEM trial).  2. Pulmonary HTN: Mixed picture with PVR 8.1 on RHC at [facility]  However, repeat study in 8/15 here showed no significant pulmonary arterial hypertension.  2017 RHC with pulmonary venous hypertension.  3. Morbid obesity: Continue to work on weight loss.  She is going to work aggressively on diet and increase her exercise level.  - She is going to start liraglutide to help with weight loss, if we can get her BMI down she heart transplant may be future option.  4. Hyperlipidemia: Continue atorvastatin.  5. PVCs: 11% PVCs + NSVT on 2/20 Zio patch.  Amiodarone started, but PVCs up to 13.3% with NSVT on 3/20 Zio patch. She saw Dr. [name] who thought that the ventricular arrhythmias were electromechanical due to worsening HF.  In 4/20, she had VT x 2 with ICD discharges in the setting of hypokalemia. Zio patch in 1/21 with PVCs down to 6.2% of beats. VT again in 2/22, quinidine started.  - Continue amiodarone, check LFTs and TSH.  She will need a regular eye exam with amiodarone use.  - Continue quinidine per EP.  6. CVA: CVA in 10/21, had thrombectomy with stent placement.  No LV thrombus noted and no atrial  fibrillation noted.  - To continue ticagrelor for now, she will get an MRI head per her report and interventional radiology will determine whether she will need to continue ticagrelor.  7. Fe deficiency: She reports heavy periods.  She has had 2 Feraheme infusions recently.   Recommended follow-up:  3 months.   Signed, [name], MD  03/26/2021  [place] [address] [facility] [place] [address] [phone] (office) [phone] (fax)

## 2021-04-01 ENCOUNTER — Ambulatory Visit (INDEPENDENT_AMBULATORY_CARE_PROVIDER_SITE_OTHER): Payer: BC Managed Care – PPO

## 2021-04-01 DIAGNOSIS — Z4502 Encounter for adjustment and management of automatic implantable cardiac defibrillator: Secondary | ICD-10-CM

## 2021-04-01 DIAGNOSIS — I5022 Chronic systolic (congestive) heart failure: Secondary | ICD-10-CM

## 2021-04-01 DIAGNOSIS — Z95810 Presence of automatic (implantable) cardiac defibrillator: Secondary | ICD-10-CM

## 2021-04-02 ENCOUNTER — Ambulatory Visit (HOSPITAL_COMMUNITY)
Admission: RE | Admit: 2021-04-02 | Discharge: 2021-04-02 | Disposition: A | Discharge status: Home / Self Care | Payer: BC Managed Care – PPO | Source: Ambulatory Visit | Attending: Interventional Radiology | Admitting: Interventional Radiology

## 2021-04-02 DIAGNOSIS — I639 Cerebral infarction, unspecified: Secondary | ICD-10-CM | POA: Diagnosis not present

## 2021-04-02 DIAGNOSIS — Z8673 Personal history of transient ischemic attack (TIA), and cerebral infarction without residual deficits: Secondary | ICD-10-CM | POA: Diagnosis not present

## 2021-04-02 MED ORDER — IOHEXOL 350 MG/ML SOLN
80.0000 mL | Freq: Once | INTRAVENOUS | Status: AC | PRN
  Administered 2021-04-02: 80 mL via INTRAVENOUS

## 2021-04-03 ENCOUNTER — Telehealth: Payer: Self-pay

## 2021-04-03 ENCOUNTER — Telehealth (HOSPITAL_COMMUNITY): Payer: Self-pay

## 2021-04-03 NOTE — Telephone Encounter (Signed)
Remote ICM transmission received.  Attempted call to patient regarding ICM remote transmission and no answer.  No voice mail option.

## 2021-04-03 NOTE — Telephone Encounter (Signed)
Pt agreed to f/u in 6 months with cta head/neck. AW 

## 2021-04-03 NOTE — Progress Notes (Signed)
EPIC Encounter for ICM Monitoring  Patient Name: [name] is a 49 y.o. female Date: 04/03/2021 Primary Care Physican: Patient, No Pcp Per (Inactive) Primary Cardiologist: [name] Electrophysiologist: [name] 02/27/2021 Weight: 235 lbs     Attempted call to patient and unable to reach.  Transmission reviewed.    Optivol Thoracic impedance suggesting normal fluid levels.   Prescribed:  Torsemide 20 mg Take 4 tablets (80 mg total) by mouth every morning and 3 tablets (60 mg total) every afternoon. Potassium 20 mEq Take 2 tablets (40 mEq total) by mouth twice a day  Spironolactone 25 mg take 1 tablet daily.   Farxiga 10 mg take 1 tablet daily.   Labs: 03/26/2021 Creatinine 1.35, BUN 14, Potassium 3.6, Sodium 138, GFR 48 02/27/2021 Creatinine 1.32, BUN 13, Potassium 4.2, Sodium 136, GFR 49  02/11/2021 Creatinine 1.31, BUN 17, Potassium 3.7, Sodium 137, GFR 50 11/13/2020 Creatinine 1.42, BUN 17, Potassium 3.9, Sodium 135, GFR 45 11/01/2020 Creatinine 1.38, BUN 14, Potassium 4.1, Sodium 136, GFR 47 10/23/2020 Creatinine 1.36, BUN 17, Potassium 4.4, Sodium 135, GFR 48 08/21/2020 Creatinine 1.36, BUN 17, Potassium 4.1, Sodium 136, GFR 48 08/07/2020 Creatinine 1.15, BUN 16, Potassium 3.9, Sodium 138, GFR 58 07/23/2020 Creatinine 1.67, BUN 18, Potassium 3.0, Sodium 134, GFR 37 A complete set of results can be found in Results Review.   Recommendations:  Unable to reach.     Follow-up plan: ICM clinic phone appointment on 05/06/2021.   91 day device clinic remote transmission 06/17/2021.         EP/Cardiology Office Visits:  Recall 06/24/2021 with Dr [name].  Recall 06/23/2021 with Dr [name]   Copy of ICM check sent to Dr. [name].    3 month ICM trend: 04/01/2021.    1 Year ICM trend:       [name], RN 04/03/2021 9:07 AM

## 2021-04-29 ENCOUNTER — Ambulatory Visit (INDEPENDENT_AMBULATORY_CARE_PROVIDER_SITE_OTHER): Payer: BC Managed Care – PPO | Admitting: Adult Health

## 2021-04-29 ENCOUNTER — Encounter: Payer: Self-pay | Admitting: Adult Health

## 2021-04-29 VITALS — BP 109/76 | HR 78 | Ht 63.0 in | Wt 240.0 lb

## 2021-04-29 DIAGNOSIS — I63312 Cerebral infarction due to thrombosis of left middle cerebral artery: Secondary | ICD-10-CM

## 2021-04-29 DIAGNOSIS — I69398 Other sequelae of cerebral infarction: Secondary | ICD-10-CM

## 2021-04-29 DIAGNOSIS — Z9989 Dependence on other enabling machines and devices: Secondary | ICD-10-CM

## 2021-04-29 DIAGNOSIS — E785 Hyperlipidemia, unspecified: Secondary | ICD-10-CM | POA: Diagnosis not present

## 2021-04-29 DIAGNOSIS — I10 Essential (primary) hypertension: Secondary | ICD-10-CM

## 2021-04-29 DIAGNOSIS — R209 Unspecified disturbances of skin sensation: Secondary | ICD-10-CM

## 2021-04-29 DIAGNOSIS — G4733 Obstructive sleep apnea (adult) (pediatric): Secondary | ICD-10-CM

## 2021-04-29 NOTE — Patient Instructions (Signed)
Continue aspirin 81 mg daily  and atorvastatin  for secondary stroke prevention  We will repeat cholesterol levels today  Please ensure you follow up with Dr. [name] regarding use of CPAP machine for sleep apnea  Continue to follow up with PCP regarding cholesterol and blood pressure management  Maintain strict control of hypertension with blood pressure goal below 130/90 and cholesterol with LDL cholesterol (bad cholesterol) goal below 70 mg/dL.       Followup in the future with me in 6 months or call earlier if needed       Thank you for coming to see [name] at [facility]. I hope we have been able to provide you high quality care today.  You may receive a patient satisfaction survey over the next few weeks. We would appreciate your feedback and comments so that we may continue to improve ourselves and the health of our patients.

## 2021-04-29 NOTE — Progress Notes (Signed)
[facility] [address] [place]. [place] [address] [phone]       STROKE FOLLOW UP NOTE  [name] Date of Birth:  1970-12-16 Medical Record Number:  [id number]   Reason for Referral: stroke follow up    SUBJECTIVE:   CHIEF COMPLAINT:  Chief Complaint  Patient presents with   Follow-up    Rm 3 alone  Pt is well and stable, still have some tingling in R hand but not sever.      HPI:   Update 04/29/2021 JM: Returns for 55-month stroke follow-up unaccompanied  Overall stable without new stroke/TIA symptoms. Residual occasional right hand finger tips tingling/numbness and weakness (at times, difficulty opening bottles/jars). Denies residual numbness/tingling into right leg.   Compliant on aspirin, Brilinta and atorvastatin -denies side effects Blood pressure today 109/76 Has not been using CPAP over the past couple of months due to difficulty tolerating pressure - is followed by Dr. [name] - plans to schedule f/u visit as prior visit approx 1 year ago She does not currently have an established PCP - is planning on looking to establish care   Routinely follows with cardiology for CHF monitoring/management  F/u with IR Dr. Estanislado Pandy -recent CTA head/neck showed patent L MCA stent - plans to repeat in 6 months - advised to continue Brilinta until repeat scan  No new/further concerns at this time     History provided for reference purposes only Update 10/29/2020 JM: [name] returns for 41-month stroke follow-up accompanied by her husband, [name]  Reports residual right leg numbness/tingling with occasional painful sensation at nighttime which at times can interfere with her sleep.  Mild decreased right hand dexterity and slight numbness gradually improving.  She has been taking Tylenol PM with increased pain as well as massage and use of compression with benefit. Denies new stroke/TIA symptoms  Remains on aspirin and Brilinta as well as  atorvastatin without associated side effects Blood pressure today 95/66 -routinely monitors at home and typically runs on lower side Use of CPAP nightly for OSA management   Has since had follow-up with cardiology with recent medication adjustments as well as IR with stable CTA with plans on repeating around September and per pt, IR advised ongoing use of Brilinta until that time  No further concerns at this time   Initial visit 07/31/2020 JM: [name] is being seen for hospital follow-up from admission in 03/2020 accompanied by her husband.  She reports residual decreased right hand dexterity and tingling right hand and foot.  At discharge, she initially had diminished sensation on right side but this has been slowly improving.  More recently, she will experience pins-and-needles sensation more at nighttime. She will use a compression sock with benefit.   She has not participated in any therapies as she reports being told that therapy was not needed.   Denies new or worsening stroke/TIA symptoms.  She has remained on aspirin and Brilinta without bleeding or bruising.  Remains on atorvastatin 40 mg daily without myalgias.  Blood pressure today 106/71.  Reports 1 syncopal event which sounds to be hypotension related and has not experienced any additional events since cardiology lowered Entresto dosage.  Reports compliance with CPAP for sleep apnea management.  She did have follow-up with Dr. Estanislado Pandy who plans on repeating CTA around this time -she reports calling office and leaving voicemail to schedule imaging 1-2 weeks ago but has not received return call back to schedule.  No further concerns at this  time.  Stroke admission 04/17/2020 [name] is a 50 y.o. female with a history of CHF with an EF of 15 to 20% who presented to [facility] ED on 04/17/2020 with right-sided weakness.  Personally reviewed hospitalization pertinent progress notes, lab work and imaging with summary provided.  Evaluated by Dr.  Leonie Man with CT showing subtle hypodensity of the left [place] and evidence of left M1 occlusion s/p mechanical thrombectomy and stent.  Repeat CTA showed stent with nonocclusive thrombus along the posterior wall mid stent.  Unable to obtain MRI d/t ICD.  2D echo showed EF 20 to 25%.  ICD interrogated which did not show evidence of atrial fibrillation.  She was placed on Brilinta 90 mg twice daily and aspirin 81 mg daily for secondary stroke prevention.  She was advised to follow-up outpatient with Dr. Estanislado Pandy. 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  History of HLD on atorvastatin 40 mg daily with LDL 127.  No prior history or evidence of DM with A1c 5.3.  Other stroke risk factors include hx of substance abuse, obesity, family history of stroke, CAD/MI, OSA on CPAP and systolic CHF.  Evaluated by therapies and recommended discharge home with OP PT/OT.    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  CT perfusion with 64 cc delay in left MCA territory and 0cc CBF and collateral flow to left MCA branches. Patient went for intervention with thrombectomy and stent placement of left M1 occlusion.    Post-stent CT 10/27 shows continued mild hypodensity of left putamen without other loss of grey-white differentiation or hemorrhage with stent at M1/M2 left MCA CTA 10/28 with left MCA stent with nonocclusive thrombus along the posterior wall of the mid stent.  Goal bp systolic <272 from neurological standpoint.  Unable to obtain MRI/MRA due to ICD  2D Echo without thrombus, EF  20-25% and global hypokinesis, severely dilated LV w/grade III diastolic dysfunction. RVP 38 mmHg, decreased IVC respiratory variability, EF similar to prior 09/27/19 ICD negative for atrial fibrillation. Continue brillinta 90 mg bid and asa 81 mg qd.    LDL 127 HgbA1c 5.3 VTE prophylaxis - lovenox Continue PT/OT  Therapy recommendations:  OP PT/OT/SLP Disposition: Stable for discharge from neurological standpoint pending PT/OT evaluation today for CIR vs. HHPT. With EF 20-25% without thrombus, ICD negative for atrial fibrillation. Recommend continuing brilinta 90 mg bid and asa 81 mg. Will possibly switch to warfarin in the future per cardiology with concern for thrombus being an ongoing concern. Follow-up in neurology clinic in six weeks.       ROS:   14 system review of systems performed and negative with exception of those listed in HPI  PMH:  Past Medical History:  Diagnosis Date   Automatic implantable cardioverter-defibrillator in situ    Chronic CHF (congestive heart failure) ([place])    a. EF 15-20% b. RHC (09/2013) RA 14, RV 57/22, PA 64/36 (48), PCWP 18, FIck CO/CI 3.7/1.6, PVR 8.1 WU, PA sat 47%    History of stomach ulcers    Hypertension    Hypotension    Hypothyroidism  Morbid obesity ([place])    Myocardial infarction ([place]) 08/2013   Nocturnal dyspnea    Nonischemic cardiomyopathy ([facility])    Sinus tachycardia    Sleep apnea    Snoring-prob OSA 09/04/2011   Sprint Fidelis ICD lead RECALL  6949    UARS (upper airway resistance syndrome) 09/04/2011   HST 12/2013:  AHI 4/hr (numerous episodes of airflow reduction that did not have concomitant desaturation)     PSH:  Past Surgical History:  Procedure Laterality Date   BREATH TEK H PYLORI N/A 11/09/2014   Procedure: BREATH TEK H PYLORI;  Surgeon: Greer Pickerel, MD;  Location: [facility];  Service: General;  Laterality: N/A;   CARDIAC CATHETERIZATION  ~ 2006; 09/2013   CARDIAC CATHETERIZATION N/A 05/23/2016   Procedure: Right  Heart Cath;  Surgeon: Larey Dresser, MD;  Location: [facility];  Service: Cardiovascular;  Laterality: N/A;   CARDIAC DEFIBRILLATOR PLACEMENT  2006; 12/26/2013   Medtronic [id number]; 12-2013 ICD gen change and RV lead revision with new 6935 RV lead by Dr [name]   CESAREAN SECTION  1999   IMPLANTABLE CARDIOVERTER DEFIBRILLATOR GENERATOR CHANGE N/A 12/26/2013   Procedure: IMPLANTABLE CARDIOVERTER DEFIBRILLATOR GENERATOR CHANGE;  Surgeon: Deboraha Sprang, MD;  Location: [facility] LAB;  Service: Cardiovascular;  Laterality: N/A;   IR CT HEAD LTD  04/17/2020   IR CT HEAD LTD  04/17/2020   IR INTRA CRAN STENT  04/17/2020   IR PERCUTANEOUS ART THROMBECTOMY/INFUSION INTRACRANIAL INC DIAG ANGIO  04/17/2020   IR RADIOLOGIST EVAL & MGMT  06/05/2020   LEAD REVISION N/A 12/26/2013   Procedure: LEAD REVISION;  Surgeon: Deboraha Sprang, MD;  Location: [facility] CATH LAB;  Service: Cardiovascular;  Laterality: N/A;   RADIOLOGY WITH ANESTHESIA N/A 04/17/2020   Procedure: Code Stroke;  Surgeon: Luanne Bras, MD;  Location: [facility];  Service: Radiology;  Laterality: N/A;   RIGHT HEART CATHETERIZATION N/A 02/15/2014   Procedure: RIGHT HEART CATH;  Surgeon: Larey Dresser, MD;  Location: [facility] CATH LAB;  Service: Cardiovascular;  Laterality: N/A;   TUBAL LIGATION  1999    Social History:  Social History   Socioeconomic History   Marital status: Married    Spouse name: Ercilia Bettinger   Number of children: 2   Years of education: Not on file   Highest education level: Not on file  Occupational History   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  Tobacco Use   Smoking status: Never   Smokeless tobacco: Never  Vaping Use   Vaping Use: Never used  Substance and Sexual Activity   Alcohol use: No   Drug use: No   Sexual activity: Yes  Other Topics Concern   Not on file  Social History Narrative   Soda sometimes   Right handed   Social Determinants of Health   Financial Resource Strain: Not on  file  Food Insecurity: Not on file  Transportation Needs: Not on file  Physical Activity: Not on file  Stress: Not on file  Social Connections: Not on file  Intimate Partner Violence: Not on file    Family History:  Family History  Problem Relation Age of Onset   Heart disease Maternal Grandmother    Heart disease Mother    Obesity Mother    High blood pressure Father    Stroke Father     Medications:   Current Outpatient Medications on File Prior to Visit  Medication Sig Dispense Refill   amiodarone (PACERONE) 200 MG  tablet TAKE 1 TABLET BY MOUTH ONCE DAILY 90 tablet 1   aspirin 81 MG chewable tablet Chew 1 tablet (81 mg total) by mouth daily. 30 tablet 11   atorvastatin (LIPITOR) 80 MG tablet Take 1 tablet (80 mg total) by mouth daily. 90 tablet 3   carvedilol (COREG) 6.25 MG tablet Take 1 tablet (6.25 mg total) by mouth 2 (two) times daily with a meal. 180 tablet 3   digoxin (LANOXIN) 0.125 MG tablet TAKE 1 TABLET BY MOUTH ONCE A DAY 30 tablet 11   FARXIGA 10 MG TABS tablet TAKE 1 TABLET BY MOUTH ONCE A DAY BEFOREBREAKFAST 30 tablet 4   levothyroxine (SYNTHROID) 25 MCG tablet Take 1 tablet (25 mcg total) by mouth daily before breakfast. 90 tablet 3   potassium chloride SA (KLOR-CON) 20 MEQ tablet Take 40 mEq by mouth 2 (two) times daily.     quiniDINE gluconate 324 MG CR tablet Take 1 tablet (324 mg total) by mouth 2 (two) times daily. 60 tablet 6   sacubitril-valsartan (ENTRESTO) 24-26 MG Take 1 tablet by mouth 2 (two) times daily. 60 tablet 3   spironolactone (ALDACTONE) 25 MG tablet TAKE 1 TABLET BY MOUTH EVERY NIGHT AT BEDTIME 90 tablet 3   ticagrelor (BRILINTA) 90 MG TABS tablet Take 1 tablet (90 mg total) by mouth 2 (two) times daily. 60 tablet 11   torsemide (DEMADEX) 20 MG tablet Take 4 tablets (80 mg total) by mouth every morning AND 3 tablets (60 mg total) every evening. 630 tablet 2   No current facility-administered medications on file prior to visit.     Allergies:  No Known Allergies    OBJECTIVE:  Physical Exam  Vitals:   04/29/21 0801  BP: 109/76  Pulse: 78  Weight: 240 lb (108.9 kg)  Height: 5\' 3"  (1.6 m)    Body mass index is 42.51 kg/m. No results found.  General: well developed, well nourished, very pleasant middle-aged American female seated, in no evident distress Head: head normocephalic and atraumatic.   Neck: supple with no carotid or supraclavicular bruits Cardiovascular: regular rate and rhythm, no murmurs Musculoskeletal: no deformity Skin:  no rash/petichiae Vascular:  Normal pulses all extremities   Neurologic Exam Mental Status: Awake and fully alert.   Fluent speech and language.  Oriented to place and time. Recent and remote memory intact. Attention span, concentration and fund of knowledge appropriate. Mood and affect appropriate.  Cranial Nerves: Pupils equal, briskly reactive to light. Extraocular movements full without nystagmus. Visual fields full to confrontation. Hearing intact. Facial sensation intact. Face, tongue, palate moves normally and symmetrically.  Motor: Normal bulk and tone. Normal strength in all tested extremity muscles except slightly decreased right hand dexterity and mild right ankle dorsiflexion weakness Sensory.:  Decreased light touch sensation RUE otherwise intact throughout Coordination: Rapid alternating movements normal in all extremities except slightly decreased right hand dexterity. Finger-to-nose and heel-to-shin performed accurately bilaterally. Gait and Station: Arises from chair without difficulty. Stance is normal. Gait demonstrates normal stride length and balance without use of assistive device.  Tandem walk and heel toe performed without difficulty.   Reflexes: 1+ and symmetric. Toes downgoing.         ASSESSMENT: [name] is a 50 y.o. year old female presented with right-sided weakness on 04/17/2020 with stroke work-up revealing left MCA embolic stroke  in setting of left M1 occlusion s/p IR thrombectomy and stent placement felt secondary to cardioembolic etiology.  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     PLAN:  L MCA stroke :  Residual deficit: RUE hemisensory impairment with occasional numbness/tingling and mild right hand decreased dexterity.  Overall greatly improved since prior visit Continue aspirin 81 mg daily  and atorvastatin 80 mg daily for secondary stroke prevention.   Discussed secondary stroke prevention measures and importance of establishing care with PCP for ongoing aggressive stroke risk factor management L M1 occlusion:  s/p stent 03/2020 by Dr. Estanislado Pandy on [place].   CTA head/neck 08/2020 s/p stent spanning L M1 and proximal M2/MCA posterior division branch, filling defect seen within the stent on prior CT resolved (nonocclusive thrombus), no new large vessel occlusion or significant stenosis. CTA head/neck 03/2021 normal.  Left MCA stent patent Followed by Dr. Estanislado Pandy on [place] and plans on 9-month repeat CTA  HTN: BP goal <130/90.  Stable for patient routinely monitored by cardiology HLD: LDL goal <70.  Prior LDL 138 10/2020 - increased atorvastatin from 40 mg to 80mg  daily - repeat lipid panel today OSA on CPAP: Discussed use of importance of nightly compliance for secondary stroke prevention and cardiovascular risk factors.  Reports continued difficulty tolerating due to pressures - discussed ways to help improve tolerance but also to ensure f/u visit with Dr. [name] to further discuss Systolic HF: Routinely monitored by CHF clinic and cardiology    Follow up in 6 months or call earlier if needed    I spent 33 minutes of face-to-face and non-face-to-face time with patient.  This included previsit chart review, lab review, study review, order entry, electronic health record documentation, patient education and discussion  regarding history of prior stroke and residual deficits, aggressive stroke risk factor management and secondary stroke prevention measures, routine follow-up with Dr. Estanislado Pandy for cerebral stent, importance of nightly CPAP use and intolerance issues and answered all other questions to patient's satisfaction   Frann Rider, AGNP-BC  [facility] [address] [place], [place] [address]  Phone [phone] Fax [phone] Note: This document was prepared with digital dictation and possible smart phrase technology. Any transcriptional errors that result from this process are unintentional.

## 2021-04-30 LAB — LIPID PANEL
Chol/HDL Ratio: 3.2 ratio (ref 0.0–4.4)
Cholesterol, Total: 196 mg/dL (ref 100–199)
HDL: 62 mg/dL (ref >39)
LDL Chol Calc (NIH): 119 mg/dL — ABNORMAL HIGH (ref 0–99)
Triglycerides: 85 mg/dL (ref 0–149)
VLDL Cholesterol Cal: 15 mg/dL (ref 5–40)

## 2021-05-01 ENCOUNTER — Other Ambulatory Visit (HOSPITAL_COMMUNITY): Payer: Self-pay | Admitting: Cardiology

## 2021-05-01 ENCOUNTER — Other Ambulatory Visit: Payer: Self-pay | Admitting: Adult Health

## 2021-05-01 DIAGNOSIS — G4733 Obstructive sleep apnea (adult) (pediatric): Secondary | ICD-10-CM | POA: Diagnosis not present

## 2021-05-01 MED ORDER — ROSUVASTATIN CALCIUM 40 MG PO TABS: 40.0000 mg | ORAL_TABLET | Freq: Every day | ORAL | 5 refills | Status: DC

## 2021-05-03 DIAGNOSIS — G4733 Obstructive sleep apnea (adult) (pediatric): Secondary | ICD-10-CM | POA: Diagnosis not present

## 2021-05-06 ENCOUNTER — Ambulatory Visit (INDEPENDENT_AMBULATORY_CARE_PROVIDER_SITE_OTHER): Payer: BC Managed Care – PPO

## 2021-05-06 DIAGNOSIS — Z95810 Presence of automatic (implantable) cardiac defibrillator: Secondary | ICD-10-CM | POA: Diagnosis not present

## 2021-05-06 DIAGNOSIS — I5022 Chronic systolic (congestive) heart failure: Secondary | ICD-10-CM | POA: Diagnosis not present

## 2021-05-06 NOTE — Progress Notes (Signed)
EPIC Encounter for ICM Monitoring  Patient Name: [name] is a 50 y.o. female Date: 05/06/2021 Primary Care Physican: Patient, No Pcp Per (Inactive) Primary Cardiologist: [name] Electrophysiologist: [name] 05/06/2021 Weight: 235 lbs     Spoke with patient and heart failure questions reviewed.  Pt asymptomatic for fluid accumulation and feeling well.   Optivol Thoracic impedance suggesting possible ongoing fluid accumulation since 10/20 with exception of one day at baseline but close to baseline.   Prescribed:  Torsemide 20 mg Take 4 tablets (80 mg total) by mouth every morning and 3 tablets (60 mg total) every afternoon. Potassium 20 mEq Take 2 tablets (40 mEq total) by mouth twice a day  Spironolactone 25 mg take 1 tablet daily.   Farxiga 10 mg take 1 tablet daily.   Labs: 03/26/2021 Creatinine 1.35, BUN 14, Potassium 3.6, Sodium 138, GFR 48 02/27/2021 Creatinine 1.32, BUN 13, Potassium 4.2, Sodium 136, GFR 49  02/11/2021 Creatinine 1.31, BUN 17, Potassium 3.7, Sodium 137, GFR 50 11/13/2020 Creatinine 1.42, BUN 17, Potassium 3.9, Sodium 135, GFR 45 11/01/2020 Creatinine 1.38, BUN 14, Potassium 4.1, Sodium 136, GFR 47 10/23/2020 Creatinine 1.36, BUN 17, Potassium 4.4, Sodium 135, GFR 48 08/21/2020 Creatinine 1.36, BUN 17, Potassium 4.1, Sodium 136, GFR 48 08/07/2020 Creatinine 1.15, BUN 16, Potassium 3.9, Sodium 138, GFR 58 07/23/2020 Creatinine 1.67, BUN 18, Potassium 3.0, Sodium 134, GFR 37 A complete set of results can be found in Results Review.   Recommendations:  Recommendation to limit salt intake to 2000 mg daily and fluid intake to 64 oz daily.  Encouraged to call if experiencing any fluid symptoms.    Follow-up plan: ICM clinic phone appointment on 05/20/2021 recheck fluid levels.   91 day device clinic remote transmission 06/17/2021.         EP/Cardiology Office Visits:  Recall 06/24/2021 with Dr [name].  Recall 06/23/2021 with Dr [name]   Copy of ICM check sent to Dr.  [name].    3 month ICM trend: 05/06/2021.    12-14 Month ICM trend:       [name], RN 05/06/2021 4:03 PM

## 2021-05-20 ENCOUNTER — Ambulatory Visit (INDEPENDENT_AMBULATORY_CARE_PROVIDER_SITE_OTHER): Payer: Medicare Other

## 2021-05-20 DIAGNOSIS — I5022 Chronic systolic (congestive) heart failure: Secondary | ICD-10-CM

## 2021-05-20 DIAGNOSIS — Z95810 Presence of automatic (implantable) cardiac defibrillator: Secondary | ICD-10-CM

## 2021-05-22 NOTE — Progress Notes (Signed)
EPIC Encounter for ICM Monitoring  Patient Name: [name] is a 50 y.o. female Date: 05/22/2021 Primary Care Physican: Patient, No Pcp Per (Inactive) Primary Cardiologist: [name] Electrophysiologist: [name] 05/06/2021 Weight: 235 lbs     Spoke with patient and heart failure questions reviewed.  Pt asymptomatic for fluid accumulation and feeling well.   Optivol Thoracic impedance suggesting fluid levels returned to normal.   Prescribed:  Torsemide 20 mg Take 4 tablets (80 mg total) by mouth every morning and 3 tablets (60 mg total) every afternoon. Potassium 20 mEq Take 2 tablets (40 mEq total) by mouth twice a day  Spironolactone 25 mg take 1 tablet daily.   Farxiga 10 mg take 1 tablet daily.   Labs: 03/26/2021 Creatinine 1.35, BUN 14, Potassium 3.6, Sodium 138, GFR 48 02/27/2021 Creatinine 1.32, BUN 13, Potassium 4.2, Sodium 136, GFR 49  02/11/2021 Creatinine 1.31, BUN 17, Potassium 3.7, Sodium 137, GFR 50 11/13/2020 Creatinine 1.42, BUN 17, Potassium 3.9, Sodium 135, GFR 45 11/01/2020 Creatinine 1.38, BUN 14, Potassium 4.1, Sodium 136, GFR 47 10/23/2020 Creatinine 1.36, BUN 17, Potassium 4.4, Sodium 135, GFR 48 08/21/2020 Creatinine 1.36, BUN 17, Potassium 4.1, Sodium 136, GFR 48 08/07/2020 Creatinine 1.15, BUN 16, Potassium 3.9, Sodium 138, GFR 58 07/23/2020 Creatinine 1.67, BUN 18, Potassium 3.0, Sodium 134, GFR 37 A complete set of results can be found in Results Review.   Recommendations:   Encouraged to call if experiencing any fluid symptoms.    Follow-up plan: ICM clinic phone appointment on 06/10/2021.   91 day device clinic remote transmission 09/16/2021.         EP/Cardiology Office Visits:  Recall 06/24/2021 with Dr [name].  Recall 06/23/2021 with Dr [name]   Copy of ICM check sent to Dr. [name].    3 month ICM trend: 05/20/2021.    12-14 Month ICM trend:       [name], RN 05/22/2021 12:58 PM

## 2021-05-31 DIAGNOSIS — G4733 Obstructive sleep apnea (adult) (pediatric): Secondary | ICD-10-CM | POA: Diagnosis not present

## 2021-06-10 ENCOUNTER — Ambulatory Visit (INDEPENDENT_AMBULATORY_CARE_PROVIDER_SITE_OTHER): Payer: BC Managed Care – PPO

## 2021-06-10 DIAGNOSIS — Z95810 Presence of automatic (implantable) cardiac defibrillator: Secondary | ICD-10-CM

## 2021-06-10 DIAGNOSIS — Z4502 Encounter for adjustment and management of automatic implantable cardiac defibrillator: Secondary | ICD-10-CM

## 2021-06-10 DIAGNOSIS — I5022 Chronic systolic (congestive) heart failure: Secondary | ICD-10-CM

## 2021-06-11 NOTE — Progress Notes (Signed)
EPIC Encounter for ICM Monitoring  Patient Name: [name] is a 50 y.o. female Date: 06/11/2021 Primary Care Physican: Patient, No Pcp Per (Inactive) Primary Cardiologist: [name] Electrophysiologist: [name] 05/06/2021 Weight: 235 lbs 06/11/2021 Weight: 224 lbs     Spoke with patient and heart failure questions reviewed.  Pt asymptomatic for fluid accumulation and feeling well.  Did not have any fluid symptoms during decreased impedance.   Optivol Thoracic impedance normal but was suggesting possible fluid accumulation from 11/27 - 12/10.   Prescribed:  Torsemide 20 mg Take 4 tablets (80 mg total) by mouth every morning and 3 tablets (60 mg total) every afternoon. Potassium 20 mEq Take 2 tablets (40 mEq total) by mouth twice a day  Spironolactone 25 mg take 1 tablet daily.   Farxiga 10 mg take 1 tablet daily.   Labs: 03/26/2021 Creatinine 1.35, BUN 14, Potassium 3.6, Sodium 138, GFR 48 02/27/2021 Creatinine 1.32, BUN 13, Potassium 4.2, Sodium 136, GFR 49  02/11/2021 Creatinine 1.31, BUN 17, Potassium 3.7, Sodium 137, GFR 50 11/13/2020 Creatinine 1.42, BUN 17, Potassium 3.9, Sodium 135, GFR 45 11/01/2020 Creatinine 1.38, BUN 14, Potassium 4.1, Sodium 136, GFR 47 10/23/2020 Creatinine 1.36, BUN 17, Potassium 4.4, Sodium 135, GFR 48 08/21/2020 Creatinine 1.36, BUN 17, Potassium 4.1, Sodium 136, GFR 48 08/07/2020 Creatinine 1.15, BUN 16, Potassium 3.9, Sodium 138, GFR 58 07/23/2020 Creatinine 1.67, BUN 18, Potassium 3.0, Sodium 134, GFR 37 A complete set of results can be found in Results Review.   Recommendations:   Encouraged to call if experiencing any fluid symptoms.    Follow-up plan: ICM clinic phone appointment on 07/15/2021.   91 day device clinic remote transmission 09/16/2021.         EP/Cardiology Office Visits:  Recall 06/24/2021 with Dr [name].  Recall 06/23/2021 with Dr [name]   Copy of ICM check sent to Dr. [name].    3 month ICM trend: 06/10/2021.    12-14 Month ICM  trend:       [name], RN 06/11/2021 1:01 PM

## 2021-06-28 ENCOUNTER — Telehealth: Payer: Self-pay | Admitting: Pharmacist

## 2021-06-28 NOTE — Telephone Encounter (Signed)
Spoke with patient regarding Saxenda prescription.  Patient would like to reconsider starting therapy in March 2023.  Have note to call in 2 months.

## 2021-07-01 DIAGNOSIS — G4733 Obstructive sleep apnea (adult) (pediatric): Secondary | ICD-10-CM | POA: Diagnosis not present

## 2021-07-05 ENCOUNTER — Encounter: Payer: Self-pay | Admitting: Cardiology

## 2021-07-05 ENCOUNTER — Other Ambulatory Visit: Payer: Self-pay

## 2021-07-05 ENCOUNTER — Telehealth (INDEPENDENT_AMBULATORY_CARE_PROVIDER_SITE_OTHER): Payer: BC Managed Care – PPO | Admitting: Cardiology

## 2021-07-05 VITALS — BP 114/48 | Ht 63.0 in | Wt 235.2 lb

## 2021-07-05 DIAGNOSIS — G4733 Obstructive sleep apnea (adult) (pediatric): Secondary | ICD-10-CM | POA: Diagnosis not present

## 2021-07-05 NOTE — Patient Instructions (Signed)
Medication Instructions:  Your physician recommends that you continue on your current medications as directed. Please refer to the Current Medication list given to you today.  *If you need a refill on your cardiac medications before your next appointment, please call your pharmacy*  Follow-Up: At [facility], you and your health needs are our priority.  As part of our continuing mission to provide you with exceptional heart care, we have created designated Provider Care Teams.  These Care Teams include your primary Cardiologist (physician) and Advanced Practice Providers (APPs -  Physician Assistants and Nurse Practitioners) who all work together to provide you with the care you need, when you need it.  Your next appointment:   3 month(s)  The format for your next appointment:   In Person  Provider:   Fransico Him, MD  Other Instructions Dr. [name] has increased your auto settings to 5-20cm H2O and has ordered a chin strap. She will check a download in 4 weeks.

## 2021-07-05 NOTE — Progress Notes (Signed)
Virtual Visit via Video Note   This visit type was conducted due to national recommendations for restrictions regarding the COVID-19 Pandemic (e.g. social distancing) in an effort to limit this patient's exposure and mitigate transmission in our community.  Due to her co-morbid illnesses, this patient is at least at moderate risk for complications without adequate follow up.  This format is felt to be most appropriate for this patient at this time.  All issues noted in this document were discussed and addressed.  A limited physical exam was performed with this format.  Please refer to the patient's chart for her consent to telehealth for [facility].  Date:  07/05/2021   ID:  [name], DOB June 02, 1971, MRN [id number] The patient was identified using 2 identifiers.  Patient Location: Home Provider Location: Office/Clinic  PCP:  Patient, No Pcp Per (Inactive)  Cardiologist:  [name], MD  Electrophysiologist:  Virl Axe, MD   Evaluation Performed:  Follow-Up Visit  Chief Complaint:  OSA  History of Present Illness:    [name] is a 51 y.o. female with hx of HTN, chronic systolic CHF, morbid obesity and hx of upper airway resistance syndrome with HST in 2015 with AHI 4/hr. due to excessive daytime sleepiness and loud snoring she underwent repeat home sleep study which showed severe OSA with an AHI of 30.3/hr with nadir O2 sat 85%.  She was started on auto CPAP from 4 to 20cm H2O.  She is doing well with her CPAP device and thinks that she has gotten used to it.  She tolerates the nasal cushion mask but at times feels like she does not get enough air or pressure.  Since going on CPAP she feels rested in the am and has no significant daytime sleepiness.  She denies any significant mouth or nasal dryness or nasal congestion.  She thinks that she is still snoring.  The patient does not have symptoms concerning for COVID-19 infection (fever, chills, cough, or new shortness of  breath).   Past Medical History:  Diagnosis Date   Automatic implantable cardioverter-defibrillator in situ    Chronic CHF (congestive heart failure) ([place])    a. EF 15-20% b. RHC (09/2013) RA 14, RV 57/22, PA 64/36 (48), PCWP 18, FIck CO/CI 3.7/1.6, PVR 8.1 WU, PA sat 47%    History of stomach ulcers    Hypertension    Hypotension    Hypothyroidism    Morbid obesity ([place])    Myocardial infarction ([place]) 08/2013   Nocturnal dyspnea    Nonischemic cardiomyopathy ([facility])    Sinus tachycardia    Sleep apnea    Snoring-prob OSA 09/04/2011   [employer] ICD lead RECALL  6949    UARS (upper airway resistance syndrome) 09/04/2011   HST 12/2013:  AHI 4/hr (numerous episodes of airflow reduction that did not have concomitant desaturation)    Past Surgical History:  Procedure Laterality Date   BREATH TEK H PYLORI N/A 11/09/2014   Procedure: BREATH TEK H PYLORI;  Surgeon: Greer Pickerel, MD;  Location: [facility];  Service: General;  Laterality: N/A;   CARDIAC CATHETERIZATION  ~ 2006; 09/2013   CARDIAC CATHETERIZATION N/A 05/23/2016   Procedure: Right Heart Cath;  Surgeon: Larey Dresser, MD;  Location: [place] [facility];  Service: Cardiovascular;  Laterality: N/A;   CARDIAC DEFIBRILLATOR PLACEMENT  2006; 12/26/2013   Medtronic [id number]; 12-2013 ICD gen change and RV lead revision with new 6935 RV lead by Dr Alveta Heimlich  SECTION  1999   IMPLANTABLE CARDIOVERTER DEFIBRILLATOR GENERATOR CHANGE N/A 12/26/2013   Procedure: IMPLANTABLE CARDIOVERTER DEFIBRILLATOR GENERATOR CHANGE;  Surgeon: Deboraha Sprang, MD;  Location: [facility] CATH LAB;  Service: Cardiovascular;  Laterality: N/A;   IR CT HEAD LTD  04/17/2020   IR CT HEAD LTD  04/17/2020   IR INTRA CRAN STENT  04/17/2020   IR PERCUTANEOUS ART THROMBECTOMY/INFUSION INTRACRANIAL INC DIAG ANGIO  04/17/2020   IR RADIOLOGIST EVAL & MGMT  06/05/2020   LEAD REVISION N/A 12/26/2013   Procedure: LEAD REVISION;  Surgeon: Deboraha Sprang, MD;  Location: [facility] CATH  LAB;  Service: Cardiovascular;  Laterality: N/A;   RADIOLOGY WITH ANESTHESIA N/A 04/17/2020   Procedure: Code Stroke;  Surgeon: Luanne Bras, MD;  Location: [place];  Service: Radiology;  Laterality: N/A;   RIGHT HEART CATHETERIZATION N/A 02/15/2014   Procedure: RIGHT HEART CATH;  Surgeon: Larey Dresser, MD;  Location: [facility] CATH LAB;  Service: Cardiovascular;  Laterality: N/A;   TUBAL LIGATION  1999     Current Meds  Medication Sig   amiodarone (PACERONE) 200 MG tablet TAKE 1 TABLET BY MOUTH ONCE DAILY   ASPIRIN LOW DOSE 81 MG EC tablet TAKE 1 TABLET BY MOUTH ONCE A DAY   BRILINTA 90 MG TABS tablet TAKE 1 TABLET BY MOUTH TWICE A DAY   carvedilol (COREG) 6.25 MG tablet Take 1 tablet (6.25 mg total) by mouth 2 (two) times daily with a meal.   digoxin (LANOXIN) 0.125 MG tablet TAKE 1 TABLET BY MOUTH ONCE A DAY   ENTRESTO 24-26 MG TAKE 1 TABLET BY MOUTH TWICE A DAY   FARXIGA 10 MG TABS tablet TAKE 1 TABLET BY MOUTH ONCE A DAY BEFOREBREAKFAST   levothyroxine (SYNTHROID) 25 MCG tablet Take 1 tablet (25 mcg total) by mouth daily before breakfast.   potassium chloride SA (KLOR-CON) 20 MEQ tablet Take 40 mEq by mouth 2 (two) times daily.   quiniDINE gluconate 324 MG CR tablet Take 1 tablet (324 mg total) by mouth 2 (two) times daily.   rosuvastatin (CRESTOR) 40 MG tablet Take 1 tablet (40 mg total) by mouth daily.   spironolactone (ALDACTONE) 25 MG tablet TAKE 1 TABLET BY MOUTH EVERY NIGHT AT BEDTIME   torsemide (DEMADEX) 20 MG tablet Take 20 mg by mouth as directed. Take 4 tablets in the AM and 2 tablets in the PM     Allergies:   Patient has no known allergies.   Social History   Tobacco Use   Smoking status: Never   Smokeless tobacco: Never  Vaping Use   Vaping Use: Never used  Substance Use Topics   Alcohol use: No   Drug use: No     Family Hx: The patient's family history includes Heart disease in her maternal grandmother and mother; High blood pressure in her father; Obesity  in her mother; Stroke in her father.  ROS:   Please see the history of present illness.     All other systems reviewed and are negative.   Prior CV studies:   The following studies were reviewed today:  Home sleep study and PAP compliance download  Labs/Other Tests and Data Reviewed:    EKG:  No ECG reviewed.  Recent Labs: 07/23/2020: NT-Pro BNP 312 12/25/2020: Hemoglobin 10.1; Platelets 430 03/26/2021: ALT 11; B Natriuretic Peptide 343.7; BUN 14; Creatinine, Ser 1.35; Potassium 3.6; Sodium 138; TSH 4.084   Recent Lipid Panel Lab Results  Component Value Date/Time   CHOL 196 04/29/2021 08:42  AM   CHOL 183 09/20/2013 04:24 AM   TRIG 85 04/29/2021 08:42 AM   TRIG 105 09/20/2013 04:24 AM   HDL 62 04/29/2021 08:42 AM   HDL 40 09/20/2013 04:24 AM   CHOLHDL 3.2 04/29/2021 08:42 AM   CHOLHDL 4.0 04/18/2020 09:50 AM   LDLCALC 119 (H) 04/29/2021 08:42 AM   LDLCALC 122 (H) 09/20/2013 04:24 AM    Wt Readings from Last 3 Encounters:  07/05/21 235 lb 3.2 oz (106.7 kg)  04/29/21 240 lb (108.9 kg)  03/26/21 239 lb (108.4 kg)     Risk Assessment/Calculations:      Objective:    Vital Signs:  BP (!) 114/48    Ht 5\' 3"  (1.6 m)    Wt 235 lb 3.2 oz (106.7 kg)    BMI 41.66 kg/m   Well nourished, well developed female in no acute distress. Well appearing, alert and conversant, regular work of breathing,  good skin color  Eyes- anicteric mouth- oral mucosa is pink  neuro- grossly intact skin- no apparent rash or lesions or cyanosis    ASSESSMENT & PLAN:     OSA - The patient is tolerating PAP therapy well without any problems. The PAP download performed by his DME was personally reviewed and interpreted by me today and showed an AHI of 3.7/hr on auto PAP with 60% compliance in using more than 4 hours nightly.  The patient has been using and benefiting from PAP use and will continue to benefit from therapy.  -Encouraged increased compliance with device -I will increase auto settings  to 5-20cm H2O since there are times that she feels like she is not getting enough air -for the ongoing snoring, I will order a chin strap -check download in 4 weeks   2.  Morbid Obesity -I have encouraged her to get into a routine exercise program and cut back on carbs and portions.    Shared Decision Making/Informed Consent       COVID-19 Education: She is fully vaccinated  Time:   Today, I have spent 15 minutes with the patient with telehealth technology discussing the above problems.     Medication Adjustments/Labs and Tests Ordered: Current medicines are reviewed at length with the patient today.  Concerns regarding medicines are outlined above.   Tests Ordered: No orders of the defined types were placed in this encounter.   Medication Changes: No orders of the defined types were placed in this encounter.   Follow Up:  In Person in 3 months  Signed, Fransico Him, MD  07/05/2021 9:21 AM    [place]

## 2021-07-09 ENCOUNTER — Ambulatory Visit (INDEPENDENT_AMBULATORY_CARE_PROVIDER_SITE_OTHER): Payer: Medicare Other

## 2021-07-09 DIAGNOSIS — Z95810 Presence of automatic (implantable) cardiac defibrillator: Secondary | ICD-10-CM

## 2021-07-09 DIAGNOSIS — I5022 Chronic systolic (congestive) heart failure: Secondary | ICD-10-CM

## 2021-07-09 NOTE — Progress Notes (Signed)
EPIC Encounter for ICM Monitoring  Patient Name: [name] is a 51 y.o. female Date: 07/09/2021 Primary Care Physican: Patient, No Pcp Per (Inactive) Primary Cardiologist: [name] Electrophysiologist: [name] 05/06/2021 Weight: 235 lbs 06/11/2021 Weight: 234 lbs 07/09/2021 Weight: 234 lbs     Pt sent remote transmission for review due to experiencing SOB and cough for past 4 days. She is not feeling well and could tell she has fluid accumulation.    Optivol Thoracic impedance suggesting possible fluid accumulation starting 12/27.   Prescribed:  Torsemide 20 mg Take 4 tablets (80 mg total) by mouth every morning and 3 tablets (60 mg total) every afternoon. Potassium 20 mEq Take 2 tablets (40 mEq total) by mouth twice a day  Spironolactone 25 mg take 1 tablet daily.   Farxiga 10 mg take 1 tablet daily.   Labs: 03/26/2021 Creatinine 1.35, BUN 14, Potassium 3.6, Sodium 138, GFR 48 02/27/2021 Creatinine 1.32, BUN 13, Potassium 4.2, Sodium 136, GFR 49  02/11/2021 Creatinine 1.31, BUN 17, Potassium 3.7, Sodium 137, GFR 50 11/13/2020 Creatinine 1.42, BUN 17, Potassium 3.9, Sodium 135, GFR 45 11/01/2020 Creatinine 1.38, BUN 14, Potassium 4.1, Sodium 136, GFR 47 10/23/2020 Creatinine 1.36, BUN 17, Potassium 4.4, Sodium 135, GFR 48 08/21/2020 Creatinine 1.36, BUN 17, Potassium 4.1, Sodium 136, GFR 48 08/07/2020 Creatinine 1.15, BUN 16, Potassium 3.9, Sodium 138, GFR 58 07/23/2020 Creatinine 1.67, BUN 18, Potassium 3.0, Sodium 134, GFR 37 A complete set of results can be found in Results Review.   Recommendations:  Confirmed she is compliant with take meds as prescribed.    Copy sent to Dr [name] for review and recommendations if needed.     Follow-up plan: ICM clinic phone appointment on 07/15/2021.   91 day device clinic remote transmission 09/16/2021.         EP/Cardiology Office Visits:  Recall 06/24/2021 with Dr [name].  Recall 06/23/2021 with Dr [name]   Copy of ICM check sent to Dr. [name].     3 month ICM trend: 07/09/2021.    12-14 Month ICM trend:     [name], RN 07/09/2021 10:20 AM

## 2021-07-09 NOTE — Progress Notes (Signed)
Increase torsemide to 80 mg bid, BMET 1 week.  Cut back on fluid and sodium intake.

## 2021-07-09 NOTE — Progress Notes (Signed)
Spoke with patient and advised Dr [name] increased Torsemide to 80 mg twice a day, BMET in a week and decrease salt and fluid intake.  She has Torsemide supply on hand and prescription updated to reflect dosage change.  BMET scheduled[facility]/24 at [facility].  Advised to call back if symptoms do not improve with increased Torsemide dosage.

## 2021-07-15 ENCOUNTER — Ambulatory Visit (INDEPENDENT_AMBULATORY_CARE_PROVIDER_SITE_OTHER): Payer: BC Managed Care – PPO

## 2021-07-15 DIAGNOSIS — I5022 Chronic systolic (congestive) heart failure: Secondary | ICD-10-CM | POA: Diagnosis not present

## 2021-07-15 DIAGNOSIS — Z95810 Presence of automatic (implantable) cardiac defibrillator: Secondary | ICD-10-CM

## 2021-07-16 ENCOUNTER — Other Ambulatory Visit: Payer: Self-pay

## 2021-07-16 ENCOUNTER — Ambulatory Visit (HOSPITAL_COMMUNITY)
Admission: RE | Admit: 2021-07-16 | Discharge: 2021-07-16 | Disposition: A | Discharge status: Home / Self Care | Payer: BC Managed Care – PPO | Source: Ambulatory Visit | Attending: Cardiology | Admitting: Cardiology

## 2021-07-16 DIAGNOSIS — I5022 Chronic systolic (congestive) heart failure: Secondary | ICD-10-CM | POA: Diagnosis not present

## 2021-07-16 LAB — BASIC METABOLIC PANEL
Anion gap: 8 (ref 5–15)
BUN: 11 mg/dL (ref 6–20)
CO2: 21 mmol/L — ABNORMAL LOW (ref 22–32)
Calcium: 8.9 mg/dL (ref 8.9–10.3)
Chloride: 107 mmol/L (ref 98–111)
Creatinine, Ser: 1.34 mg/dL — ABNORMAL HIGH (ref 0.44–1.00)
GFR, Estimated: 48 mL/min — ABNORMAL LOW (ref >60)
Glucose, Bld: 96 mg/dL (ref 70–99)
Potassium: 3.9 mmol/L (ref 3.5–5.1)
Sodium: 136 mmol/L (ref 135–145)

## 2021-07-16 NOTE — Progress Notes (Signed)
EPIC Encounter for ICM Monitoring  Patient Name: [name] is a 51 y.o. female Date: 07/16/2021 Primary Care Physican: Patient, No Pcp Per (Inactive) Primary Cardiologist: [name] Electrophysiologist: [name] 05/06/2021 Weight: 235 lbs 06/11/2021 Weight: 234 lbs 07/09/2021 Weight: 234 lbs     Spoke with patient and heart failure questions reviewed.  Pt reports fluid symptoms resolved but she feels like her heart is racing and general weakness since Torsemide was increased to 80 mg bid.  She also took extra Potassium with increased Torsemide dosage.  She is not feeling well today.      Optivol Thoracic impedance suggesting fluid levels returned to normal since Torsemide was increased to 80 mg bid.   Prescribed:  Torsemide 20 mg Take 4 tablets (80 mg total) by mouth twice a day. Potassium 20 mEq Take 2 tablets (40 mEq total) by mouth twice a day  Spironolactone 25 mg take 1 tablet daily.   Farxiga 10 mg take 1 tablet daily.   Labs: 03/26/2021 Creatinine 1.35, BUN 14, Potassium 3.6, Sodium 138, GFR 48 02/27/2021 Creatinine 1.32, BUN 13, Potassium 4.2, Sodium 136, GFR 49  02/11/2021 Creatinine 1.31, BUN 17, Potassium 3.7, Sodium 137, GFR 50 11/13/2020 Creatinine 1.42, BUN 17, Potassium 3.9, Sodium 135, GFR 45 11/01/2020 Creatinine 1.38, BUN 14, Potassium 4.1, Sodium 136, GFR 47 10/23/2020 Creatinine 1.36, BUN 17, Potassium 4.4, Sodium 135, GFR 48 08/21/2020 Creatinine 1.36, BUN 17, Potassium 4.1, Sodium 136, GFR 48 08/07/2020 Creatinine 1.15, BUN 16, Potassium 3.9, Sodium 138, GFR 58 07/23/2020 Creatinine 1.67, BUN 18, Potassium 3.0, Sodium 134, GFR 37 A complete set of results can be found in Results Review.   Recommendations:  Advised patient to call or send my chart message to Dr Oleh Genin office and ask for appt with PA/NP regarding side effects of increasing Torsemide dosage.    Follow-up plan: ICM clinic phone appointment on 07/30/2021 to recheck fluid levels.   91 day device clinic  remote transmission 09/16/2021.         EP/Cardiology Office Visits:  09/17/2021 with Dr [name].  Recall 06/23/2021 with Dr [name]   Copy of ICM check sent to Dr. [name].    3 month ICM trend: 07/15/2021.    12-14 Month ICM trend:     [name], RN 07/16/2021 9:51 AM

## 2021-07-18 ENCOUNTER — Inpatient Hospital Stay
Admission: EM | Admit: 2021-07-18 | Discharge: 2021-07-22 | DRG: 291 | Disposition: A | Discharge status: Home / Self Care | Payer: BC Managed Care – PPO | Attending: Internal Medicine | Admitting: Internal Medicine

## 2021-07-18 ENCOUNTER — Telehealth: Payer: Self-pay | Admitting: *Deleted

## 2021-07-18 ENCOUNTER — Other Ambulatory Visit: Payer: Self-pay

## 2021-07-18 ENCOUNTER — Emergency Department: Payer: BC Managed Care – PPO

## 2021-07-18 DIAGNOSIS — R7989 Other specified abnormal findings of blood chemistry: Secondary | ICD-10-CM | POA: Diagnosis present

## 2021-07-18 DIAGNOSIS — E871 Hypo-osmolality and hyponatremia: Secondary | ICD-10-CM | POA: Diagnosis not present

## 2021-07-18 DIAGNOSIS — I472 Ventricular tachycardia, unspecified: Secondary | ICD-10-CM | POA: Diagnosis not present

## 2021-07-18 DIAGNOSIS — Z7902 Long term (current) use of antithrombotics/antiplatelets: Secondary | ICD-10-CM

## 2021-07-18 DIAGNOSIS — Z20822 Contact with and (suspected) exposure to covid-19: Secondary | ICD-10-CM | POA: Diagnosis present

## 2021-07-18 DIAGNOSIS — Z6841 Body Mass Index (BMI) 40.0 and over, adult: Secondary | ICD-10-CM

## 2021-07-18 DIAGNOSIS — Z8673 Personal history of transient ischemic attack (TIA), and cerebral infarction without residual deficits: Secondary | ICD-10-CM | POA: Diagnosis not present

## 2021-07-18 DIAGNOSIS — Z8249 Family history of ischemic heart disease and other diseases of the circulatory system: Secondary | ICD-10-CM

## 2021-07-18 DIAGNOSIS — N179 Acute kidney failure, unspecified: Secondary | ICD-10-CM | POA: Diagnosis present

## 2021-07-18 DIAGNOSIS — I493 Ventricular premature depolarization: Secondary | ICD-10-CM | POA: Diagnosis present

## 2021-07-18 DIAGNOSIS — E785 Hyperlipidemia, unspecified: Secondary | ICD-10-CM | POA: Diagnosis not present

## 2021-07-18 DIAGNOSIS — D72829 Elevated white blood cell count, unspecified: Secondary | ICD-10-CM | POA: Diagnosis present

## 2021-07-18 DIAGNOSIS — Z79890 Hormone replacement therapy (postmenopausal): Secondary | ICD-10-CM

## 2021-07-18 DIAGNOSIS — R0602 Shortness of breath: Secondary | ICD-10-CM

## 2021-07-18 DIAGNOSIS — N189 Chronic kidney disease, unspecified: Secondary | ICD-10-CM | POA: Diagnosis not present

## 2021-07-18 DIAGNOSIS — G4733 Obstructive sleep apnea (adult) (pediatric): Secondary | ICD-10-CM

## 2021-07-18 DIAGNOSIS — E876 Hypokalemia: Secondary | ICD-10-CM | POA: Diagnosis present

## 2021-07-18 DIAGNOSIS — D509 Iron deficiency anemia, unspecified: Secondary | ICD-10-CM | POA: Diagnosis present

## 2021-07-18 DIAGNOSIS — Z95810 Presence of automatic (implantable) cardiac defibrillator: Secondary | ICD-10-CM | POA: Diagnosis not present

## 2021-07-18 DIAGNOSIS — Z823 Family history of stroke: Secondary | ICD-10-CM | POA: Diagnosis not present

## 2021-07-18 DIAGNOSIS — E039 Hypothyroidism, unspecified: Secondary | ICD-10-CM

## 2021-07-18 DIAGNOSIS — I081 Rheumatic disorders of both mitral and tricuspid valves: Secondary | ICD-10-CM | POA: Diagnosis present

## 2021-07-18 DIAGNOSIS — Z79899 Other long term (current) drug therapy: Secondary | ICD-10-CM

## 2021-07-18 DIAGNOSIS — I130 Hypertensive heart and chronic kidney disease with heart failure and stage 1 through stage 4 chronic kidney disease, or unspecified chronic kidney disease: Principal | ICD-10-CM | POA: Diagnosis present

## 2021-07-18 DIAGNOSIS — D649 Anemia, unspecified: Secondary | ICD-10-CM | POA: Diagnosis not present

## 2021-07-18 DIAGNOSIS — I252 Old myocardial infarction: Secondary | ICD-10-CM

## 2021-07-18 DIAGNOSIS — I5023 Acute on chronic systolic (congestive) heart failure: Secondary | ICD-10-CM | POA: Diagnosis not present

## 2021-07-18 DIAGNOSIS — N1831 Chronic kidney disease, stage 3a: Secondary | ICD-10-CM | POA: Diagnosis not present

## 2021-07-18 DIAGNOSIS — I2510 Atherosclerotic heart disease of native coronary artery without angina pectoris: Secondary | ICD-10-CM | POA: Diagnosis present

## 2021-07-18 DIAGNOSIS — I428 Other cardiomyopathies: Secondary | ICD-10-CM | POA: Diagnosis present

## 2021-07-18 DIAGNOSIS — I5043 Acute on chronic combined systolic (congestive) and diastolic (congestive) heart failure: Secondary | ICD-10-CM

## 2021-07-18 DIAGNOSIS — Z7982 Long term (current) use of aspirin: Secondary | ICD-10-CM

## 2021-07-18 DIAGNOSIS — I5041 Acute combined systolic (congestive) and diastolic (congestive) heart failure: Secondary | ICD-10-CM | POA: Diagnosis not present

## 2021-07-18 DIAGNOSIS — I509 Heart failure, unspecified: Secondary | ICD-10-CM | POA: Diagnosis not present

## 2021-07-18 DIAGNOSIS — I517 Cardiomegaly: Secondary | ICD-10-CM | POA: Diagnosis not present

## 2021-07-18 LAB — BASIC METABOLIC PANEL
Anion gap: 9 (ref 5–15)
BUN: 13 mg/dL (ref 6–20)
CO2: 21 mmol/L — ABNORMAL LOW (ref 22–32)
Calcium: 9.2 mg/dL (ref 8.9–10.3)
Chloride: 103 mmol/L (ref 98–111)
Creatinine, Ser: 1.43 mg/dL — ABNORMAL HIGH (ref 0.44–1.00)
GFR, Estimated: 45 mL/min — ABNORMAL LOW (ref >60)
Glucose, Bld: 101 mg/dL — ABNORMAL HIGH (ref 70–99)
Potassium: 3.8 mmol/L (ref 3.5–5.1)
Sodium: 133 mmol/L — ABNORMAL LOW (ref 135–145)

## 2021-07-18 LAB — CBC
HCT: 33.4 % — ABNORMAL LOW (ref 36.0–46.0)
Hemoglobin: 10.1 g/dL — ABNORMAL LOW (ref 12.0–15.0)
MCH: 23.1 pg — ABNORMAL LOW (ref 26.0–34.0)
MCHC: 30.2 g/dL (ref 30.0–36.0)
MCV: 76.4 fL — ABNORMAL LOW (ref 80.0–100.0)
Platelets: 315 10*3/uL (ref 150–400)
RBC: 4.37 MIL/uL (ref 3.87–5.11)
RDW: 16.6 % — ABNORMAL HIGH (ref 11.5–15.5)
WBC: 14.3 10*3/uL — ABNORMAL HIGH (ref 4.0–10.5)
nRBC: 0.1 % (ref 0.0–0.2)

## 2021-07-18 LAB — RESP PANEL BY RT-PCR (FLU A&B, COVID) ARPGX2
Influenza A by PCR: NEGATIVE
Influenza B by PCR: NEGATIVE
SARS Coronavirus 2 by RT PCR: NEGATIVE

## 2021-07-18 LAB — BRAIN NATRIURETIC PEPTIDE: B Natriuretic Peptide: 1331.6 pg/mL — ABNORMAL HIGH (ref 0.0–100.0)

## 2021-07-18 MED ORDER — POTASSIUM CHLORIDE CRYS ER 20 MEQ PO TBCR
40.0000 meq | EXTENDED_RELEASE_TABLET | Freq: Two times a day (BID) | ORAL | Status: DC
  Administered 2021-07-19 – 2021-07-22 (×7): 40 meq via ORAL
  Filled 2021-07-18 (×7): qty 2

## 2021-07-18 MED ORDER — DIGOXIN 125 MCG PO TABS
0.1250 mg | ORAL_TABLET | Freq: Every day | ORAL | Status: DC
  Administered 2021-07-19 – 2021-07-22 (×4): 0.125 mg via ORAL
  Filled 2021-07-18 (×4): qty 1

## 2021-07-18 MED ORDER — TRAZODONE HCL 50 MG PO TABS
25.0000 mg | ORAL_TABLET | Freq: Every evening | ORAL | Status: DC | PRN
  Administered 2021-07-20 – 2021-07-21 (×2): 25 mg via ORAL
  Filled 2021-07-18 (×2): qty 1

## 2021-07-18 MED ORDER — DAPAGLIFLOZIN PROPANEDIOL 10 MG PO TABS
10.0000 mg | ORAL_TABLET | Freq: Every day | ORAL | Status: DC
  Administered 2021-07-19 – 2021-07-22 (×4): 10 mg via ORAL
  Filled 2021-07-18 (×4): qty 1

## 2021-07-18 MED ORDER — LEVOTHYROXINE SODIUM 25 MCG PO TABS
25.0000 ug | ORAL_TABLET | Freq: Every day | ORAL | Status: DC
  Administered 2021-07-19 – 2021-07-22 (×4): 25 ug via ORAL
  Filled 2021-07-18 (×4): qty 1

## 2021-07-18 MED ORDER — ACETAMINOPHEN 650 MG RE SUPP: 650.0000 mg | Freq: Four times a day (QID) | RECTAL | Status: DC | PRN

## 2021-07-18 MED ORDER — ASPIRIN EC 81 MG PO TBEC
81.0000 mg | DELAYED_RELEASE_TABLET | Freq: Every day | ORAL | Status: DC
  Administered 2021-07-19 – 2021-07-22 (×4): 81 mg via ORAL
  Filled 2021-07-18 (×4): qty 1

## 2021-07-18 MED ORDER — MAGNESIUM HYDROXIDE 400 MG/5ML PO SUSP: 30.0000 mL | Freq: Every day | ORAL | Status: DC | PRN

## 2021-07-18 MED ORDER — QUINIDINE GLUCONATE ER 324 MG PO TBCR
324.0000 mg | EXTENDED_RELEASE_TABLET | Freq: Two times a day (BID) | ORAL | Status: DC
  Administered 2021-07-18 – 2021-07-19 (×2): 324 mg via ORAL
  Filled 2021-07-18 (×4): qty 1

## 2021-07-18 MED ORDER — SPIRONOLACTONE 25 MG PO TABS
25.0000 mg | ORAL_TABLET | Freq: Every day | ORAL | Status: DC
  Administered 2021-07-18 – 2021-07-20 (×2): 25 mg via ORAL
  Filled 2021-07-18 (×3): qty 1

## 2021-07-18 MED ORDER — ONDANSETRON HCL 4 MG/2ML IJ SOLN: 4.0000 mg | Freq: Four times a day (QID) | INTRAMUSCULAR | Status: DC | PRN

## 2021-07-18 MED ORDER — ROSUVASTATIN CALCIUM 10 MG PO TABS
40.0000 mg | ORAL_TABLET | Freq: Every day | ORAL | Status: DC
  Administered 2021-07-19 – 2021-07-22 (×4): 40 mg via ORAL
  Filled 2021-07-18: qty 4
  Filled 2021-07-18: qty 2
  Filled 2021-07-18 (×3): qty 4

## 2021-07-18 MED ORDER — ENOXAPARIN SODIUM 60 MG/0.6ML IJ SOSY
0.5000 mg/kg | PREFILLED_SYRINGE | INTRAMUSCULAR | Status: DC
  Administered 2021-07-18 – 2021-07-21 (×4): 52.5 mg via SUBCUTANEOUS
  Filled 2021-07-18: qty 0.53
  Filled 2021-07-18: qty 0.6
  Filled 2021-07-18 (×3): qty 0.53

## 2021-07-18 MED ORDER — FUROSEMIDE 10 MG/ML IJ SOLN
60.0000 mg | Freq: Once | INTRAMUSCULAR | Status: AC
  Administered 2021-07-18: 60 mg via INTRAVENOUS
  Filled 2021-07-18: qty 8

## 2021-07-18 MED ORDER — ONDANSETRON HCL 4 MG PO TABS: 4.0000 mg | ORAL_TABLET | Freq: Four times a day (QID) | ORAL | Status: DC | PRN

## 2021-07-18 MED ORDER — GUAIFENESIN-DM 100-10 MG/5ML PO SYRP
5.0000 mL | ORAL_SOLUTION | ORAL | Status: DC | PRN
  Administered 2021-07-18 – 2021-07-19 (×2): 5 mL via ORAL
  Filled 2021-07-18 (×2): qty 5

## 2021-07-18 MED ORDER — FUROSEMIDE 10 MG/ML IJ SOLN: 60.0000 mg | Freq: Two times a day (BID) | INTRAMUSCULAR | Status: DC

## 2021-07-18 MED ORDER — TICAGRELOR 90 MG PO TABS
90.0000 mg | ORAL_TABLET | Freq: Two times a day (BID) | ORAL | Status: DC
  Administered 2021-07-18 – 2021-07-22 (×8): 90 mg via ORAL
  Filled 2021-07-18 (×8): qty 1

## 2021-07-18 MED ORDER — AMIODARONE HCL 200 MG PO TABS
200.0000 mg | ORAL_TABLET | Freq: Every day | ORAL | Status: DC
  Administered 2021-07-19 – 2021-07-22 (×4): 200 mg via ORAL
  Filled 2021-07-18 (×4): qty 1

## 2021-07-18 MED ORDER — BENZONATATE 100 MG PO CAPS
100.0000 mg | ORAL_CAPSULE | Freq: Once | ORAL | Status: AC
  Administered 2021-07-18: 100 mg via ORAL
  Filled 2021-07-18: qty 1

## 2021-07-18 MED ORDER — CARVEDILOL 6.25 MG PO TABS
6.2500 mg | ORAL_TABLET | Freq: Two times a day (BID) | ORAL | Status: DC
  Administered 2021-07-19 (×2): 6.25 mg via ORAL
  Filled 2021-07-18 (×3): qty 1

## 2021-07-18 MED ORDER — ACETAMINOPHEN 325 MG PO TABS
650.0000 mg | ORAL_TABLET | Freq: Four times a day (QID) | ORAL | Status: DC | PRN
  Administered 2021-07-20: 650 mg via ORAL
  Filled 2021-07-18: qty 2

## 2021-07-18 MED ORDER — SACUBITRIL-VALSARTAN 24-26 MG PO TABS
1.0000 | ORAL_TABLET | Freq: Two times a day (BID) | ORAL | Status: DC
  Administered 2021-07-19 – 2021-07-21 (×4): 1 via ORAL
  Filled 2021-07-18 (×5): qty 1

## 2021-07-18 NOTE — Progress Notes (Addendum)
Pt still complaining of cough after PRN robitussin. NP Randol Kern made aware. Will continue to monitor.  Update 2306: NP Randol Kern placed order. Will continue to monitor.   Update 0019: Pt states lasix not helping and more SOB requesting provider at bedside. NP Randol Kern made aware. Will continue to monitor.  Update 0033: NP Randol Kern placed order. Will continue to monitor.  Update 0425: NP Randol Kern placed order for bumex 1 mg every 12 hours and discontinue to monitor. Pt made aware. Will continue to monitor.

## 2021-07-18 NOTE — ED Triage Notes (Signed)
Pt to ED for "fluid over load". +shob, cough for 2 days.  Has been taking diuretics, but states it feels like the more she takes the less urine that comes out.  Denies chest pain.  Reports had chest xray at 1600 today, not yet visible in epic.

## 2021-07-18 NOTE — Telephone Encounter (Signed)
Order placed to [employer] [facility] via community message. 

## 2021-07-18 NOTE — H&P (Signed)
[facility]   PATIENT NAME: [name]    MR#:  [id number]  DATE OF BIRTH:  24-Nov-1970  DATE OF ADMISSION:  07/18/2021  PRIMARY CARE PHYSICIAN: Patient, No Pcp Per (Inactive)   Patient is coming from: Home   REQUESTING/REFERRING PHYSICIAN: Merlyn Lot, MD  CHIEF COMPLAINT:   Chief Complaint  Patient presents with   Shortness of Breath    HISTORY OF PRESENT ILLNESS:  [name] is a 51 y.o. African-American female with medical history significant for hypertension, hypothyroidism, coronary artery disease, nonischemic cardiomyopathy status post AICD and peptic ulcer disease and chronic systolic CHF, who presented to the ER with acute onset of dyspnea as well as orthopnea and bilateral lower extremity edema with dyspnea on exertion which have been worsening over the last 3 weeks and got significantly worse over the last several days.  Over the last week his diuretic therapy has been increased to torsemide 4 tablets in the morning and 2 at night.  She has been having worsening cough productive for clear sputum over the last couple of days.  The patient however feels still retaining he is fluids.  No chest pain or palpitations.  No nausea or vomiting or abdominal pain.  No dysuria, oliguria or hematuria or flank pain.  The patient was seen in urgent care today and was advised to come to the ER.  ED Course: Upon presentation to the emergency room heart rate was 116 with respiratory rate of 22 and otherwise normal vital signs.  Labs revealed mild hyponatremia and a creatinine of 1.43 compared to 1.34 on 07/16/2021.Marland Kitchen  BNP was 1331.6 compared to 343.7 on 03/26/2021.Marland Kitchen  CBC showed WBC of 14.3 and anemia EKG as reviewed by me : EKG showed sinus tachycardia with a rate of 115 with PACs with aberrant conduction, possible left atrial enlargement, left axis deviation and LVH with QRS widening. Imaging: Chest x-ray showed cardiomegaly with vascular congestion.  The patient was given 60 mg of  IV Lasix.  She will be admitted to a cardiac telemetry bed for further evaluation and management. PAST MEDICAL HISTORY:   Past Medical History:  Diagnosis Date   Automatic implantable cardioverter-defibrillator in situ    Chronic CHF (congestive heart failure) ([place])    a. EF 15-20% b. RHC (09/2013) RA 14, RV 57/22, PA 64/36 (48), PCWP 18, FIck CO/CI 3.7/1.6, PVR 8.1 WU, PA sat 47%    History of stomach ulcers    Hypertension    Hypotension    Hypothyroidism    Morbid obesity ([place])    Myocardial infarction ([place]) 08/2013   Nocturnal dyspnea    Nonischemic cardiomyopathy ([facility])    Sinus tachycardia    Sleep apnea    Snoring-prob OSA 09/04/2011   [employer] ICD lead RECALL  6949    UARS (upper airway resistance syndrome) 09/04/2011   HST 12/2013:  AHI 4/hr (numerous episodes of airflow reduction that did not have concomitant desaturation)     PAST SURGICAL HISTORY:   Past Surgical History:  Procedure Laterality Date   BREATH TEK H PYLORI N/A 11/09/2014   Procedure: BREATH TEK H PYLORI;  Surgeon: Greer Pickerel, MD;  Location: [facility];  Service: General;  Laterality: N/A;   CARDIAC CATHETERIZATION  ~ 2006; 09/2013   CARDIAC CATHETERIZATION N/A 05/23/2016   Procedure: Right Heart Cath;  Surgeon: Larey Dresser, MD;  Location: [facility];  Service: Cardiovascular;  Laterality: N/A;   CARDIAC DEFIBRILLATOR PLACEMENT  2006; 12/26/2013  [employer] [id number]; 12-2013 ICD gen change and RV lead revision with new 6935 RV lead by Dr [name]   CESAREAN SECTION  1999   IMPLANTABLE CARDIOVERTER DEFIBRILLATOR GENERATOR CHANGE N/A 12/26/2013   Procedure: IMPLANTABLE CARDIOVERTER DEFIBRILLATOR GENERATOR CHANGE;  Surgeon: Deboraha Sprang, MD;  Location: [facility] CATH LAB;  Service: Cardiovascular;  Laterality: N/A;   IR CT HEAD LTD  04/17/2020   IR CT HEAD LTD  04/17/2020   IR INTRA CRAN STENT  04/17/2020   IR PERCUTANEOUS ART THROMBECTOMY/INFUSION INTRACRANIAL INC DIAG ANGIO  04/17/2020   IR  RADIOLOGIST EVAL & MGMT  06/05/2020   LEAD REVISION N/A 12/26/2013   Procedure: LEAD REVISION;  Surgeon: Deboraha Sprang, MD;  Location: [facility] CATH LAB;  Service: Cardiovascular;  Laterality: N/A;   RADIOLOGY WITH ANESTHESIA N/A 04/17/2020   Procedure: Code Stroke;  Surgeon: Luanne Bras, MD;  Location: [facility];  Service: Radiology;  Laterality: N/A;   RIGHT HEART CATHETERIZATION N/A 02/15/2014   Procedure: RIGHT HEART CATH;  Surgeon: Larey Dresser, MD;  Location: [facility] CATH LAB;  Service: Cardiovascular;  Laterality: N/A;   TUBAL LIGATION  1999    SOCIAL HISTORY:   Social History   Tobacco Use   Smoking status: Never   Smokeless tobacco: Never  Substance Use Topics   Alcohol use: No    FAMILY HISTORY:   Family History  Problem Relation Age of Onset   Heart disease Maternal Grandmother    Heart disease Mother    Obesity Mother    High blood pressure Father    Stroke Father     DRUG ALLERGIES:  No Known Allergies  REVIEW OF SYSTEMS:   ROS As per history of present illness. All pertinent systems were reviewed above. Constitutional, HEENT, cardiovascular, respiratory, GI, GU, musculoskeletal, neuro, psychiatric, endocrine, integumentary and hematologic systems were reviewed and are otherwise negative/unremarkable except for positive findings mentioned above in the HPI.   MEDICATIONS AT HOME:   Prior to Admission medications   Medication Sig Start Date End Date Taking? Authorizing Provider  amiodarone (PACERONE) 200 MG tablet TAKE 1 TABLET BY MOUTH ONCE DAILY 03/21/21   Deboraha Sprang, MD  ASPIRIN LOW DOSE 81 MG EC tablet TAKE 1 TABLET BY MOUTH ONCE A DAY 05/02/21   Larey Dresser, MD  BRILINTA 90 MG TABS tablet TAKE 1 TABLET BY MOUTH TWICE A DAY 05/02/21   Larey Dresser, MD  carvedilol (COREG) 6.25 MG tablet Take 1 tablet (6.25 mg total) by mouth 2 (two) times daily with a meal. 03/26/21   Larey Dresser, MD  digoxin (LANOXIN) 0.125 MG tablet TAKE 1 TABLET BY MOUTH  ONCE A DAY 03/18/21   Larey Dresser, MD  ENTRESTO 24-26 MG TAKE 1 TABLET BY MOUTH TWICE A DAY 05/02/21   Larey Dresser, MD  FARXIGA 10 MG TABS tablet TAKE 1 TABLET BY MOUTH ONCE A DAY BEFOREBREAKFAST 02/07/21   Larey Dresser, MD  levothyroxine (SYNTHROID) 25 MCG tablet Take 1 tablet (25 mcg total) by mouth daily before breakfast. 09/23/18   Deboraha Sprang, MD  potassium chloride SA (KLOR-CON) 20 MEQ tablet Take 40 mEq by mouth 2 (two) times daily.    [provider]  quiniDINE gluconate 324 MG CR tablet Take 1 tablet (324 mg total) by mouth 2 (two) times daily. 08/30/20   Deboraha Sprang, MD  rosuvastatin (CRESTOR) 40 MG tablet Take 1 tablet (40 mg total) by mouth daily. 05/01/21   Frann Rider, NP  spironolactone (ALDACTONE) 25 MG tablet TAKE 1 TABLET BY MOUTH EVERY NIGHT AT BEDTIME 10/26/20   Larey Dresser, MD  torsemide (DEMADEX) 20 MG tablet Take 80 mg by mouth 2 (two) times daily.    [provider]      VITAL SIGNS:  Blood pressure (!) 114/92, pulse (!) 104, temperature 98.4 F (36.9 C), temperature source Oral, resp. rate (!) 24, height 5\' 3"  (1.6 m), weight 106.1 kg, SpO2 99 %.  PHYSICAL EXAMINATION:  Physical Exam  GENERAL:  51 y.o.-year-old patient semi-lying in the bed with mild distress from coughing conversational dyspnea. EYES: Pupils equal, round, reactive to light and accommodation. No scleral icterus. Extraocular muscles intact.  HEENT: Head atraumatic, normocephalic. Oropharynx and nasopharynx clear.  NECK:  Supple, no jugular venous distention. No thyroid enlargement, no tenderness.  LUNGS: Diminished bibasilar breath sounds with bibasilar rales. No use of accessory muscles of respiration.  CARDIOVASCULAR: Regular rate and rhythm, S1, S2 normal. No murmurs, rubs, or gallops.  ABDOMEN: Soft, nondistended, nontender. Bowel sounds present. No organomegaly or mass.  EXTREMITIES: 1+ bilateral lower extremity pitting edema with no cyanosis, or clubbing.   NEUROLOGIC: Cranial nerves II through XII are intact. Muscle strength 5/5 in all extremities. Sensation intact. Gait not checked.  PSYCHIATRIC: The patient is alert and oriented x 3.  Normal affect and good eye contact. SKIN: No obvious rash, lesion, or ulcer.   LABORATORY PANEL:   CBC Recent Labs  Lab 07/18/21 1647  WBC 14.3*  HGB 10.1*  HCT 33.4*  PLT 315   ------------------------------------------------------------------------------------------------------------------  Chemistries  Recent Labs  Lab 07/18/21 1647  NA 133*  K 3.8  CL 103  CO2 21*  GLUCOSE 101*  BUN 13  CREATININE 1.43*  CALCIUM 9.2   ------------------------------------------------------------------------------------------------------------------  Cardiac Enzymes No results for input(s): TROPONINI in the last 168 hours. ------------------------------------------------------------------------------------------------------------------  RADIOLOGY:  DG Chest 2 View  Result Date: 07/18/2021 CLINICAL DATA:  Shortness of breath EXAM: CHEST - 2 VIEW COMPARISON:  04/19/2020 FINDINGS: Left AICD remains in place, unchanged. Cardiomegaly, vascular congestion. No confluent opacities or effusions. No edema. No acute bony abnormality. IMPRESSION: Cardiomegaly, vascular congestion. Electronically Signed   By: Rolm Baptise M.D.   On: 07/18/2021 18:28      IMPRESSION AND PLAN:  Principal Problem:   Acute CHF (congestive heart failure) ([place])  1.  Acute on chronic combined systolic and diastolic CHF with nonischemic cardiomyopathy. - The patient will be admitted to a cardiac telemetry bed. - We will continue diuresis with IV Lasix. - We will follow serial troponins. - We will follow electrolytes. - She had a 2D echo with a EF of 25% and grade 2 diastolic dysfunction, moderately dilated left atrium and mild to moderate mitral regurgitation. - We will continue Entresto, Coreg and [place]. - Cardiology consult will  be obtained. - I notified Dr. Garen Lah about the patient  2.  Hypothyroidism. - We will continue Synthroid.  3.  Dyslipidemia. - We will continue statin therapy.  4.  History of nonsustained ventricular tachycardia and PVCs. - We will continue amiodarone and quinidine.  5.  Essential hypertension. - We will continue Coreg.  DVT prophylaxis: Lovenox. Code Status: full code. Family Communication:  The plan of care was discussed in details with the patient (and family). I answered all questions. The patient agreed to proceed with the above mentioned plan. Further management will depend upon hospital course. Disposition Plan: Back to previous home environment Consults called: Cardiology. All the records are reviewed  and case discussed with ED provider.  Status is: Inpatient  At the time of the admission, it appears that the appropriate admission status for this patient is inpatient.  This is judged to be reasonable and necessary in order to provide the required intensity of service to ensure the patient's safety given the presenting symptoms, physical exam findings and initial radiographic and laboratory data in the context of comorbid conditions.  The patient requires inpatient status due to high intensity of service, high risk of further deterioration and high frequency of surveillance required.  I certify that at the time of admission, it is my clinical judgment that the patient will require inpatient hospital care extending more than 2 midnights.                            Dispo: The patient is from: Home              Anticipated d/c is to: Home              Patient currently is not medically stable to d/c.              Difficult to place patient: No   Christel Mormon M.D on 07/18/2021 at 8:21 PM  [facility]   From 7 PM-7 AM, contact night-coverage [email or web address]  CC: Primary care physician; Patient, No Pcp Per (Inactive)

## 2021-07-18 NOTE — Telephone Encounter (Signed)
-----   Message from Antonieta Iba, [place] sent at 07/05/2021  9:31 AM EST ----- Per Dr. [name]: increase auto settings to 5-20cm H2O since there are times that she feels like she is not getting enough air -for the ongoing snoring, order a chin strap -check download in 4 weeks

## 2021-07-18 NOTE — ED Notes (Signed)
Patient's bed linens changed d/t purewick leaking.

## 2021-07-18 NOTE — ED Provider Notes (Signed)
[facility] Provider Note    Event Date/Time   First MD Initiated Contact with Patient 07/18/21 1835     (approximate)   History   Shortness of Breath   HPI  [name] is a 51 y.o. female with a history of congestive heart failure most recent echo showing EF of 25% presents to the ER for evaluation of worsening orthopnea shortness of breath exertional dyspnea despite increasing her home torsemide over the past several days.  Despite a significant increase from to 4 tablets in the morning 2 at night her past week she is feeling like she is retaining more fluid she denies any chest pain or pressure.  She did think that she might of developed a viral illness because she is having more frequent cough and shortness of breath went to urgent care today was directed to the ER due to concern for volume overload.     Physical Exam   Triage Vital Signs: ED Triage Vitals  Enc Vitals Group     BP 07/18/21 1659 103/77     Pulse Rate 07/18/21 1659 (!) 116     Resp 07/18/21 1659 (!) 22     Temp 07/18/21 1659 98.4 F (36.9 C)     Temp Source 07/18/21 1659 Oral     SpO2 07/18/21 1659 98 %     Weight 07/18/21 1700 234 lb (106.1 kg)     Height 07/18/21 1700 5\' 3"  (1.6 m)     Head Circumference --      Peak Flow --      Pain Score 07/18/21 1700 0     Pain Loc --      Pain Edu? --      Excl. in [address]? --     Most recent vital signs: Vitals:   07/18/21 2030 07/18/21 2100  BP: 105/75 117/73  Pulse: 100   Resp: (!) 28 (!) 22  Temp:  99 F (37.2 C)  SpO2: 99% 100%     Constitutional: Alert  Eyes: Conjunctivae are normal.  Head: Atraumatic. Nose: No congestion/rhinnorhea. Mouth/Throat: Mucous membranes are moist.   Neck: Painless ROM.  Cardiovascular:   Good peripheral circulation.  Tachycardic but regular rhythm Respiratory: Mild tachypnea with inspiratory crackles and diminished bibasilar breath sounds Gastrointestinal: Soft and nontender.   Musculoskeletal:  no deformity, ble pitting edema Neurologic:  MAE spontaneously. No gross focal neurologic deficits are appreciated.   Skin:  Skin is warm, dry and intact. No rash noted. Psychiatric: Mood and affect are normal. Speech and behavior are normal.    ED Results / Procedures / Treatments   Labs (all labs ordered are listed, but only abnormal results are displayed) Labs Reviewed  CBC - Abnormal; Notable for the following components:      Result Value   WBC 14.3 (*)    Hemoglobin 10.1 (*)    HCT 33.4 (*)    MCV 76.4 (*)    MCH 23.1 (*)    RDW 16.6 (*)    All other components within normal limits  BASIC METABOLIC PANEL - Abnormal; Notable for the following components:   Sodium 133 (*)    CO2 21 (*)    Glucose, Bld 101 (*)    Creatinine, Ser 1.43 (*)    GFR, Estimated 45 (*)    All other components within normal limits  BRAIN NATRIURETIC PEPTIDE - Abnormal; Notable for the following components:   B Natriuretic Peptide 1,331.6 (*)    All  other components within normal limits  RESP PANEL BY RT-PCR (FLU A&B, COVID) ARPGX2  BASIC METABOLIC PANEL  CBC  HIV ANTIBODY (ROUTINE TESTING W REFLEX)     EKG  ED ECG REPORT I, Merlyn Lot, the attending physician, personally viewed and interpreted this ECG.   Date: 07/18/2021  EKG Time: 16:59  Rate: 115  Rhythm: sinus  Axis: left  Intervals: normal qt  ST&T Change: nonspecific st abnm, no stemi    RADIOLOGY Please see ED Course for my review and interpretation.  I personally reviewed all radiographic images ordered to evaluate for the above acute complaints and reviewed radiology reports and findings.  These findings were personally discussed with the patient.  Please see medical record for radiology report.    PROCEDURES:  Critical Care performed: no  .1-3 Lead EKG Interpretation Performed by: Merlyn Lot, MD Authorized by: Merlyn Lot, MD     Interpretation: abnormal     ECG rate:   115   ECG rate assessment: tachycardic     Rhythm: sinus tachycardia     Ectopy: PVCs     MEDICATIONS ORDERED IN ED: Medications  aspirin EC tablet 81 mg (has no administration in time range)  amiodarone (PACERONE) tablet 200 mg (has no administration in time range)  carvedilol (COREG) tablet 6.25 mg (has no administration in time range)  digoxin (LANOXIN) tablet 0.125 mg (has no administration in time range)  sacubitril-valsartan (ENTRESTO) 24-26 mg per tablet (has no administration in time range)  quiniDINE gluconate CR tablet 324 mg (has no administration in time range)  rosuvastatin (CRESTOR) tablet 40 mg (has no administration in time range)  spironolactone (ALDACTONE) tablet 25 mg (25 mg Oral Given 07/18/21 2145)  dapagliflozin propanediol (FARXIGA) tablet 10 mg (has no administration in time range)  levothyroxine (SYNTHROID) tablet 25 mcg (has no administration in time range)  ticagrelor (BRILINTA) tablet 90 mg (90 mg Oral Given 07/18/21 2145)  potassium chloride SA (KLOR-CON M) CR tablet 40 mEq (has no administration in time range)  furosemide (LASIX) injection 60 mg (has no administration in time range)  enoxaparin (LOVENOX) injection 52.5 mg (52.5 mg Subcutaneous Given 07/18/21 2145)  acetaminophen (TYLENOL) tablet 650 mg (has no administration in time range)    Or  acetaminophen (TYLENOL) suppository 650 mg (has no administration in time range)  magnesium hydroxide (MILK OF MAGNESIA) suspension 30 mL (has no administration in time range)  ondansetron (ZOFRAN) tablet 4 mg (has no administration in time range)    Or  ondansetron (ZOFRAN) injection 4 mg (has no administration in time range)  traZODone (DESYREL) tablet 25 mg (has no administration in time range)  guaiFENesin-dextromethorphan (ROBITUSSIN DM) 100-10 MG/5ML syrup 5 mL (5 mLs Oral Given 07/18/21 2112)  furosemide (LASIX) injection 60 mg (60 mg Intravenous Given 07/18/21 1912)     IMPRESSION / MDM / [place] / ED COURSE  I reviewed the triage vital signs and the nursing notes.                              Differential diagnosis includes, but is not limited to, Asthma, copd, CHF, pna, ptx, malignancy, Pe, anemia   Patient presenting with shortness of breath and symptoms as described above.  Patient has mild tachypnea but not hypoxic.  Not hypertensive.  Chest x-ray ordered for above differential.The patient will be placed on continuous pulse oximetry and telemetry for monitoring.  Laboratory evaluation will be sent  to evaluate for the above complaints.      Clinical Course as of 07/18/21 2229  Thu Jul 18, 2021  1957 Chest x-ray per my review does not show pneumothorax, there is pulmonary edema.  Per radiology report does show cardiomegaly as well as pulmonary edema.  BNP is significantly elevated.  Renal function at baseline.  Patient was given IV Lasix and having some diuresis but still feeling quite short of breath and as she is persistently tachycardic and mildly tachypneic I do believe that observation the hospital for additional IV diuresis is clinically indicated. [PR]  2012 I consulted with the hospitalist who have accepted the patient to their service. [PR]    Clinical Course User Index [PR] Merlyn Lot, MD     FINAL CLINICAL IMPRESSION(S) / ED DIAGNOSES   Final diagnoses:  Acute on chronic congestive heart failure, unspecified heart failure type [facility])     Rx / DC Orders   ED Discharge Orders     None        Note:  This document was prepared using Dragon voice recognition software and may include unintentional dictation errors.    Merlyn Lot, MD 07/18/21 2229

## 2021-07-19 DIAGNOSIS — D72829 Elevated white blood cell count, unspecified: Secondary | ICD-10-CM

## 2021-07-19 DIAGNOSIS — N189 Chronic kidney disease, unspecified: Secondary | ICD-10-CM

## 2021-07-19 DIAGNOSIS — I428 Other cardiomyopathies: Secondary | ICD-10-CM

## 2021-07-19 DIAGNOSIS — I5023 Acute on chronic systolic (congestive) heart failure: Secondary | ICD-10-CM | POA: Diagnosis not present

## 2021-07-19 DIAGNOSIS — Z8673 Personal history of transient ischemic attack (TIA), and cerebral infarction without residual deficits: Secondary | ICD-10-CM | POA: Diagnosis not present

## 2021-07-19 DIAGNOSIS — N179 Acute kidney failure, unspecified: Secondary | ICD-10-CM

## 2021-07-19 DIAGNOSIS — E876 Hypokalemia: Secondary | ICD-10-CM | POA: Diagnosis not present

## 2021-07-19 LAB — HIV ANTIBODY (ROUTINE TESTING W REFLEX): HIV Screen 4th Generation wRfx: NONREACTIVE

## 2021-07-19 LAB — TSH: TSH: 2.512 u[IU]/mL (ref 0.350–4.500)

## 2021-07-19 LAB — CBC
HCT: 31.4 % — ABNORMAL LOW (ref 36.0–46.0)
Hemoglobin: 9.5 g/dL — ABNORMAL LOW (ref 12.0–15.0)
MCH: 22.7 pg — ABNORMAL LOW (ref 26.0–34.0)
MCHC: 30.3 g/dL (ref 30.0–36.0)
MCV: 75.1 fL — ABNORMAL LOW (ref 80.0–100.0)
Platelets: 279 10*3/uL (ref 150–400)
RBC: 4.18 MIL/uL (ref 3.87–5.11)
RDW: 16.7 % — ABNORMAL HIGH (ref 11.5–15.5)
WBC: 9.9 10*3/uL (ref 4.0–10.5)
nRBC: 0 % (ref 0.0–0.2)

## 2021-07-19 LAB — BASIC METABOLIC PANEL
Anion gap: 11 (ref 5–15)
BUN: 16 mg/dL (ref 6–20)
CO2: 20 mmol/L — ABNORMAL LOW (ref 22–32)
Calcium: 8.7 mg/dL — ABNORMAL LOW (ref 8.9–10.3)
Chloride: 104 mmol/L (ref 98–111)
Creatinine, Ser: 1.19 mg/dL — ABNORMAL HIGH (ref 0.44–1.00)
GFR, Estimated: 56 mL/min — ABNORMAL LOW (ref >60)
Glucose, Bld: 81 mg/dL (ref 70–99)
Potassium: 3.4 mmol/L — ABNORMAL LOW (ref 3.5–5.1)
Sodium: 135 mmol/L (ref 135–145)

## 2021-07-19 MED ORDER — BUMETANIDE 0.25 MG/ML IJ SOLN
1.0000 mg | Freq: Two times a day (BID) | INTRAMUSCULAR | Status: DC
  Administered 2021-07-19: 1 mg via INTRAVENOUS
  Filled 2021-07-19 (×2): qty 4

## 2021-07-19 MED ORDER — FUROSEMIDE 10 MG/ML IJ SOLN
80.0000 mg | Freq: Two times a day (BID) | INTRAMUSCULAR | Status: DC
  Administered 2021-07-19 – 2021-07-20 (×2): 80 mg via INTRAVENOUS
  Filled 2021-07-19 (×2): qty 8

## 2021-07-19 MED ORDER — DEXTROSE 5 % IV SOLN
4.0000 mg | Freq: Two times a day (BID) | INTRAVENOUS | Status: DC
  Filled 2021-07-19: qty 16

## 2021-07-19 MED ORDER — QUINIDINE GLUCONATE ER 324 MG PO TBCR
324.0000 mg | EXTENDED_RELEASE_TABLET | Freq: Two times a day (BID) | ORAL | Status: DC
  Administered 2021-07-20 – 2021-07-22 (×6): 324 mg via ORAL
  Filled 2021-07-19 (×7): qty 1

## 2021-07-19 MED ORDER — POTASSIUM CHLORIDE CRYS ER 20 MEQ PO TBCR
40.0000 meq | EXTENDED_RELEASE_TABLET | Freq: Once | ORAL | Status: AC
  Administered 2021-07-19: 40 meq via ORAL
  Filled 2021-07-19: qty 2

## 2021-07-19 MED ORDER — ALPRAZOLAM 0.25 MG PO TABS
0.2500 mg | ORAL_TABLET | Freq: Once | ORAL | Status: AC
  Administered 2021-07-19: 0.25 mg via ORAL
  Filled 2021-07-19: qty 1

## 2021-07-19 NOTE — Consult Note (Signed)
Cardiology Consultation:   Patient ID: [name] MRN: [id number]; DOB: [birth year]  Admit date: 07/18/2021 Date of Consult: 07/19/2021  PCP:  Patient, No Pcp Per (Inactive)   [facility] Providers Cardiologist:  [name], MD  Electrophysiologist:  Virl Axe, MD  Sleep Medicine:  Fransico Him, MD  {   Patient Profile:   [name] is a 51 y.o. female with a hx of morbid oebsuty, HTN, sleep apnea, systolic HF, NICM s/p Medtronic ICD (2006) who is being seen 07/19/2021 for the evaluation of acute CHF at the request of Dr. Alfredia Ferguson.  History of Present Illness:   Ms. Fulbright is followed by the advanced heart failure clinic for the above cardiac issues.  She was followed for systolic CHF initially in [place].  Apparently her EF recovered to 35 to 40%.  She was admitted to [facility] 09/19/2013 for worsening dyspnea and chest pain.  Coronaries were reportedly okay on left heart cath at that time at [facility].  Echo showed EF of 15 to 20% with four-chamber enlargement with moderate to severe TR/MR.  Right heart cath as below.  Rheumatological work-up was negative.  CPX in 9/16 showed near normal functional capacity.  Repeat echo 617 showed EF 20%.  Repeat CPX in 1/18 showed submaximal, probably mild circulatory limitation.  Echo 8/18 showed EF 25 to 30%, severe LV dilation.  CPX in 6/19 showed low normal functional capacity.  Echo on 1019 showed EF 2025%, moderate LV dilation.  Zio patch in 2/20 showed 11% PVCs and nonsustained VT and amiodarone was started.  Zio patch 320 showed 13.3% PVCs and nonsustained VT.  She saw Dr. [name], he thought the increased PVCs were electromechanical in nature due to worsening heart failure.  Repeat Zio patch on amiodarone in 121 showed 6.2% PVCs.  She was admitted for 20 with VT in the setting of hypokalemia.  CPX in 9/20 showed mild functional limitation, or due to body habitus and heart failure.  Echo 06/27/2019 showed EF 20% with severe LV dilation, diffuse  hypokinesis, normal RV size and systolic function.  In 8/21, she had shocks for VF in the setting of hypokalemia.  Patient was admitted and 10/21 with CVA involving left MCA territory.  This was thought to be cardioembolic with LV thrombus was not visualized and no A. fib has been noted.  Echo 10/21 showed EF 20 to 25%, severe LV dilation, no LV thrombus, mildly decreased RV systolic function, mild to moderate MR.  She was taken by IR for thrombectomy and stent placement, now on aspirin and Brilinta.  She had recurrent VT and 2/22, and quinidine was added by EP.  Spironolactone and Coreg were decreased due to low BP and lightheadedness.  Echo in 4/22 showed EF 25% with severe LV dilation global hypokinesis, normal RV, mild to moderate MR. CPX 8/22 showed mild to moderate heart failure limitation, exercise hypoventilation, restrictive lung physiology from obesity.  Seen 03/26/2021 and was doing better on high-dose torsemide 80 mg in the a.m. 60 mg in the p.m.  Coreg was increased to 6.25 mg twice daily.  Plan for possible heart transplant if patient able to lose weight vs LVAD candidate. She was placed in the baroreceptor activation therapy trial.  The patient called in 1/17 reported worsening shortness fo breath. Torsemide was increased to 80mg  BID. Fluid levels measured 1/23 a week later and said volume was normal.   The patient presented to the ER 07/18/21 for shortness of breath. 2 days  ago started  a wet cough, she thought she ahd COVID. Home covid test was negative. Went to an Urgent care and CXR and said lungs were dill of fluid. Chest pain related to coughing. No LLE. No significant abdominal fullness.   In the ER BP 103/77, 116bpm, RR 22. Afebrile, 98% O2. Labs showed WBC 4.3, Hgb 10.1, HCT 33.4, sodium 133, Scr 1.43, BNP 1,331. CXR with cardiomegaly and vascular congestion.    Past Medical History:  Diagnosis Date   Automatic implantable cardioverter-defibrillator in situ    Chronic CHF  (congestive heart failure) ([place])    a. EF 15-20% b. RHC (09/2013) RA 14, RV 57/22, PA 64/36 (48), PCWP 18, FIck CO/CI 3.7/1.6, PVR 8.1 WU, PA sat 47%    History of stomach ulcers    Hypertension    Hypotension    Hypothyroidism    Morbid obesity ([place])    Myocardial infarction ([place]) 08/2013   Nocturnal dyspnea    Nonischemic cardiomyopathy ([facility])    Sinus tachycardia    Sleep apnea    Snoring-prob OSA 09/04/2011   Sprint Fidelis ICD lead RECALL  6949    UARS (upper airway resistance syndrome) 09/04/2011   HST 12/2013:  AHI 4/hr (numerous episodes of airflow reduction that did not have concomitant desaturation)     Past Surgical History:  Procedure Laterality Date   BREATH TEK H PYLORI N/A 11/09/2014   Procedure: BREATH TEK H PYLORI;  Surgeon: Greer Pickerel, MD;  Location: [facility];  Service: General;  Laterality: N/A;   CARDIAC CATHETERIZATION  ~ 2006; 09/2013   CARDIAC CATHETERIZATION N/A 05/23/2016   Procedure: Right Heart Cath;  Surgeon: Larey Dresser, MD;  Location: [facility];  Service: Cardiovascular;  Laterality: N/A;   CARDIAC DEFIBRILLATOR PLACEMENT  2006; 12/26/2013   Medtronic [id number]; 12-2013 ICD gen change and RV lead revision with new 6935 RV lead by Dr [name]   CESAREAN SECTION  1999   IMPLANTABLE CARDIOVERTER DEFIBRILLATOR GENERATOR CHANGE N/A 12/26/2013   Procedure: IMPLANTABLE CARDIOVERTER DEFIBRILLATOR GENERATOR CHANGE;  Surgeon: Deboraha Sprang, MD;  Location: [facility] CATH LAB;  Service: Cardiovascular;  Laterality: N/A;   IR CT HEAD LTD  04/17/2020   IR CT HEAD LTD  04/17/2020   IR INTRA CRAN STENT  04/17/2020   IR PERCUTANEOUS ART THROMBECTOMY/INFUSION INTRACRANIAL INC DIAG ANGIO  04/17/2020   IR RADIOLOGIST EVAL & MGMT  06/05/2020   LEAD REVISION N/A 12/26/2013   Procedure: LEAD REVISION;  Surgeon: Deboraha Sprang, MD;  Location: [facility] CATH LAB;  Service: Cardiovascular;  Laterality: N/A;   RADIOLOGY WITH ANESTHESIA N/A 04/17/2020   Procedure: Code Stroke;   Surgeon: Luanne Bras, MD;  Location: [place];  Service: Radiology;  Laterality: N/A;   RIGHT HEART CATHETERIZATION N/A 02/15/2014   Procedure: RIGHT HEART CATH;  Surgeon: Larey Dresser, MD;  Location: [facility] CATH LAB;  Service: Cardiovascular;  Laterality: N/A;   TUBAL LIGATION  1999     Home Medications:  Prior to Admission medications   Medication Sig Start Date End Date Taking? Authorizing Provider  amiodarone (PACERONE) 200 MG tablet TAKE 1 TABLET BY MOUTH ONCE DAILY 03/21/21  Yes Deboraha Sprang, MD  ASPIRIN LOW DOSE 81 MG EC tablet TAKE 1 TABLET BY MOUTH ONCE A DAY 05/02/21  Yes Larey Dresser, MD  BRILINTA 90 MG TABS tablet TAKE 1 TABLET BY MOUTH TWICE A DAY 05/02/21  Yes Larey Dresser, MD  carvedilol (COREG) 6.25 MG tablet Take 1 tablet (6.25 mg total)  by mouth 2 (two) times daily with a meal. 03/26/21  Yes Larey Dresser, MD  digoxin (LANOXIN) 0.125 MG tablet TAKE 1 TABLET BY MOUTH ONCE A DAY 03/18/21  Yes Larey Dresser, MD  ENTRESTO 24-26 MG TAKE 1 TABLET BY MOUTH TWICE A DAY 05/02/21  Yes Larey Dresser, MD  FARXIGA 10 MG TABS tablet TAKE 1 TABLET BY MOUTH ONCE A DAY BEFOREBREAKFAST 02/07/21  Yes Larey Dresser, MD  levothyroxine (SYNTHROID) 25 MCG tablet Take 1 tablet (25 mcg total) by mouth daily before breakfast. 09/23/18  Yes Deboraha Sprang, MD  potassium chloride SA (KLOR-CON) 20 MEQ tablet Take 40 mEq by mouth 2 (two) times daily.   Yes [provider]  quiniDINE gluconate 324 MG CR tablet Take 1 tablet (324 mg total) by mouth 2 (two) times daily. 08/30/20  Yes Deboraha Sprang, MD  rosuvastatin (CRESTOR) 40 MG tablet Take 1 tablet (40 mg total) by mouth daily. 05/01/21  Yes McCue, Janett Billow, NP  spironolactone (ALDACTONE) 25 MG tablet TAKE 1 TABLET BY MOUTH EVERY NIGHT AT BEDTIME 10/26/20  Yes Larey Dresser, MD  torsemide (DEMADEX) 20 MG tablet Take 80 mg by mouth 2 (two) times daily.   Yes [provider]    Inpatient Medications: Scheduled Meds:   amiodarone  200 mg Oral Daily   aspirin EC  81 mg Oral Daily   bumetanide (BUMEX) IV  1 mg Intravenous Q12H   carvedilol  6.25 mg Oral BID WC   dapagliflozin propanediol  10 mg Oral Daily   digoxin  0.125 mg Oral Daily   enoxaparin (LOVENOX) injection  0.5 mg/kg Subcutaneous Q24H   levothyroxine  25 mcg Oral QAC breakfast   potassium chloride SA  40 mEq Oral BID   quiniDINE gluconate  324 mg Oral BID   rosuvastatin  40 mg Oral Daily   sacubitril-valsartan  1 tablet Oral BID   spironolactone  25 mg Oral QHS   ticagrelor  90 mg Oral BID   Continuous Infusions:  PRN Meds: acetaminophen **OR** acetaminophen, guaiFENesin-dextromethorphan, magnesium hydroxide, ondansetron **OR** ondansetron (ZOFRAN) IV, traZODone  Allergies:   No Known Allergies  Social History:   Social History   Socioeconomic History   Marital status: Married    Spouse name: Amiera Herzberg   Number of children: 2   Years of education: Not on file   Highest education level: Not on file  Occupational History   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  Tobacco Use   Smoking status: Never   Smokeless tobacco: Never  Vaping Use   Vaping Use: Never used  Substance and Sexual Activity   Alcohol use: No   Drug use: No   Sexual activity: Yes  Other Topics Concern   Not on file  Social History Narrative   Soda sometimes   Right handed   Social Determinants of Health   Financial Resource Strain: Not on file  Food Insecurity: Not on file  Transportation Needs: Not on file  Physical Activity: Not on file  Stress: Not on file  Social Connections: Not on file  Intimate Partner Violence: Not on file    Family History:    Family History  Problem Relation Age of Onset   Heart disease Maternal Grandmother    Heart disease Mother    Obesity Mother    High blood pressure Father    Stroke Father      ROS:  Please see the history of present  illness.   All other ROS reviewed and negative.     Physical  Exam/Data:   Vitals:   07/19/21 0136 07/19/21 0219 07/19/21 0300 07/19/21 0500  BP:  98/69 101/61 95/64  Pulse: 89 90 89 85  Resp:  (!) 26 17 20   Temp:  98.9 F (37.2 C)  99 F (37.2 C)  TempSrc:  Oral    SpO2: 99% 99% 98% 97%  Weight:      Height:        Intake/Output Summary (Last 24 hours) at 07/19/2021 0802 Last data filed at 07/19/2021 0032 Gross per 24 hour  Intake 240 ml  Output 1600 ml  Net -1360 ml   Last 3 Weights 07/18/2021 07/18/2021 07/05/2021  Weight (lbs) 237 lb 10.5 oz 234 lb 235 lb 3.2 oz  Weight (kg) 107.8 kg 106.142 kg 106.686 kg     Body mass index is 42.1 kg/m.  General:  Well nourished, well developed, in no acute distress HEENT: normal Neck: + JVD Vascular: No carotid bruits; Distal pulses 2+ bilaterally Cardiac:  normal S1, S2; RRR; no murmur  Lungs:  minimal wheezing, diminished breath sounds  Abd: soft, nontender, no hepatomegaly  Ext: no edema Musculoskeletal:  No deformities, BUE and BLE strength normal and equal Skin: warm and dry  Neuro:  CNs 2-12 intact, no focal abnormalities noted Psych:  Normal affect   EKG:  The EKG was personally reviewed and demonstrates: ST, 115bpm, PAC, PVC, LAD, LAE,LBBB, nonspecific t wave changes Telemetry:  Telemetry was personally reviewed and demonstrates:  Nsr, PVCs, HR 80s  Relevant CV Studies:   - Zio patch 2/20: 11% PVCs - Zio patch 3/20: 13.3% PVCs - Zio patch 1/21: 6.2% PVCs  Chronic systolic CHF: Nonischemic cardiomyopathy:   - Coronary angiography 2015 at [facility]: Normal coronaries.  - Echo (8/15) with EF 20%, moderate LV dilation, diffuse hypokinesis, normal RV size with mildly decreased systolic function, mild MR.  - Echo (5/16) with EF 20%, spherical LV with diffuse hypokinesis, mild MR.  - Echo (6/17) with EF 20%, diffuse hypokinesis, moderate LV dilation, normal RV, mild to moderate MR.  - RHC (12/17): mean RA 5, PA 56/31, mean PCWP 24, CI 2, PVR 3.7 WU - Echo (8/18) with EF 25-30%, severe LV  dilation, severe LAE.  - Echo (2/20) with EF 20-25%, moderate LV dilation, moderate MR.  - Echo (4/21) with EF 20-25%, severe LV dilation, mild-moderate MR, normal RV, PASP 26 mmHg.  - Echo (10/21) with EF 20-25%, severe LV dilation (no LV thrombus seen), mildly decreased RV systolic function, mild-moderate MR. - CPX in 9/20 showed mild functional limitation, more due to body habitus than heart failure.  - CPX in 8/21: Peak VO2 11.6, VE/VCO2 32, RER 1.2.  Mild HF limitation.  - Echo (4/22): EF 25% with severe LV dilation and global hypokinesis, normal RV, mild-moderate MR.  - CPX (8/22): peak VO2 14.3, VE/VCO2 slope 42, RER 1.09 => mild to moderate HF limitation, elevated VE/VCO2 slope is likely false from end exercise hyperventilation. Restrictive lung physiology from obesity.   Laboratory Data:  High Sensitivity Troponin:  No results for input(s): TROPONINIHS in the last 720 hours.   Chemistry Recent Labs  Lab 07/16/21 0848 07/18/21 1647 07/19/21 0717  NA 136 133* 135  K 3.9 3.8 3.4*  CL 107 103 104  CO2 21* 21* 20*  GLUCOSE 96 101* 81  BUN 11 13 16   CREATININE 1.34* 1.43* 1.19*  CALCIUM 8.9 9.2 8.7*  GFRNONAA 48*  45* 56*  ANIONGAP 8 9 11     No results for input(s): PROT, ALBUMIN, AST, ALT, ALKPHOS, BILITOT in the last 168 hours. Lipids No results for input(s): CHOL, TRIG, HDL, LABVLDL, LDLCALC, CHOLHDL in the last 168 hours.  Hematology Recent Labs  Lab 07/18/21 1647  WBC 14.3*  RBC 4.37  HGB 10.1*  HCT 33.4*  MCV 76.4*  MCH 23.1*  MCHC 30.2  RDW 16.6*  PLT 315   Thyroid  Recent Labs  Lab 07/18/21 1647  TSH 2.512    BNP Recent Labs  Lab 07/18/21 1647  BNP 1,331.6*    DDimer No results for input(s): DDIMER in the last 168 hours.   Radiology/Studies:  DG Chest 2 View  Result Date: 07/18/2021 CLINICAL DATA:  Shortness of breath EXAM: CHEST - 2 VIEW COMPARISON:  04/19/2020 FINDINGS: Left AICD remains in place, unchanged. Cardiomegaly, vascular congestion.  No confluent opacities or effusions. No edema. No acute bony abnormality. IMPRESSION: Cardiomegaly, vascular congestion. Electronically Signed   By: Rolm Baptise M.D.   On: 07/18/2021 18:28     Assessment and Plan:   Acute on chronic combined systolic and diastolic CHF NICM s/p Medtronic ICD - torsemide recently increase to 80mg  BID. ICM monitoring 1/23 showed normal fluid levels - presented 1/26 for worsening SOB. BNP up. CXR with vascular congestion - PTA Torsemide 80mg , Entresto 24-26mg  BID, Coreg 6.25mg  BID, spironolactone 25 daily, digoxin 0.0625 daily, Farxiga 10mg  daily - IV laisx 60mg  transitioned to IV Bumex? - UOP -1.6L - continue with diuresis, monitor kidney function and daily weights - kidney function improving - may need torsemide 80mg  BID on d/c or may need metolazone as OP  VT in the setting of hypokalemia PVCs - continue quinidine and amiodarone - monitor tele  HTN - Bps soft, may need to hold some PTA meds for diuresis  H/o CVA s/p thrombectomy and stent placement - continue aspirin and Brilinta  Obesity - was started on liraglutide to get weight down for possible heart transplant in the future.   Anemia - Hgb 9-10, which appears to be stable  For questions or updates, please contact [place] Please consult [email or web address] for contact info under    Signed, Kalyssa Anker Ninfa Meeker, PA-C  07/19/2021 8:02 AM

## 2021-07-19 NOTE — Progress Notes (Signed)
PROGRESS NOTE    [name]  EXB:[id number] DOB: 28-Feb-1971 DOA: 07/18/2021 PCP: Patient, No Pcp Per (Inactive)   Brief Narrative:  The patient is a 51 year old African-American female with a past medical history significant for [place] to hypothyroidism, CAD, history of nonischemic cardiomyopathy status post AICD placement, history of peptic ulcer disease, chronic systolic and diastolic CHF as well as other comorbidities who presented to the ED with acute onset of dyspnea as well as orthopnea and bilateral lower extremity swelling with dyspnea on exertion and abdominal distention and swelling has been worsening over last 3 weeks and has significantly gotten worse over several days.  Over the last week her diuretic therapy had been increased torsemide 4 tablets 2 at night.  She had been having worsening cough productive for clear sputum the last few days.  She felt that she is still retaining fluids and has been compliant with her medications.  She denied chest pain or palpitations.  No nausea vomiting or abdominal pain but she did describe abdominal distention.  She was seen in urgent care yesterday and was advised to come to the ED.  Upon arrival to the ED she had an elevated heart rate and respiratory rate.  She had mild hyponatremia and slightly elevated creatinine compared to her prior value on 07/16/2021.  EKG showed sinus tachycardia and chest x-ray showed cardiomegaly with vascular congestion.  She is given IV Lasix 60 mg and admitted to cardiac telemetry for further evaluation management and cardiology was consulted for assistance.  Assessment & Plan:   Principal Problem:   Acute CHF (congestive heart failure) (HCC)  Acute on chronic combined systolic and diastolic CHF with history of nonischemic cardiomyopathy status post AICD placement -He was admitted to cardiac telemetry bed -She was initiated on IV diuresis with IV Lasix 60 mg -Troponins never checked for some reason so we will  check now -BNP was 1331.6 -Had a 2D echo in May of 2022 with a EF of 25% and grade 2 diastolic dysfunction with a moderately dilated left atrium and mild to moderate mitral regurg -Cardiology is being consulted for further evaluation and is aware -Strict I's and O's and daily weights -Torsemide was recently increased to 80 mg twice daily and ICU monitoring on 07/15/2020 showed normal fluid levels -Presented yesterday with increased shortness of breath and elevated BNP with increased vascular congestion on chest x-ray -Her home meds include torsemide 80 mg p.o. twice daily, Entresto 24-26 mg p.o. twice daily, carvedilol 6.25 mg p.o. twice daily, small [place] 25 mg daily, digoxin 0.625 mg p.o. daily and Farxiga 10 mg Daily-she is given IV Lasix 60 mg and then transition to IV Bumex 1 mg twice daily given that she was concerned with the poor response to Lasix therapy -Currently we will continue her amiodarone, her Bumex, carvedilol, digoxin, Entresto, and dapagliflozin as well as spironolactone 25 mg p.o. nightly -We will defer diuresis to cardiology and continue monitoring her renal function carefully -Continue monitor for signs and symptoms of volume overload and repeat chest x-ray in the a.m.  Hypothyroidism -Checked TSH and was 2.512 -Continue with levothyroxine 25 mcg p.o. daily  Dyslipidemia/hyperlipidemia -Continue statin therapy with Rosuvastatin 40 mg.  History of nonsustained ventricular tachycardia PVCs -Continue on her home amiodarone 200 mg p.o. daily and quinidine gluconate 324 mg p.o. twice daily -Continue to monitor on telemetry  Leukocytosis -Likely reactive in the setting of above and is improved -Patient's WBC went from 14.8 is now 9.9 -Continue to monitor for  signs and symptoms infection; no  Hypertension -Continuing her carvedilol may need to hold her medications given her low blood pressure -Continue monitor blood pressures per protocol -Last blood pressure reading  was 91/68  History of CVA status post thrombectomy and stent placement -Continuing Aspirin 81 mg po Daily and Ticagrelor 90 mg p.o. twice daily as well as rosuvastatin 40 mg p.o. daily  AKI on CKD Stage 3a -Patient's BUNs/creatinine went from 11/1.34 -> 13/1.43 -> 16/1.19 -Continue with diuresis as above per cardiology recommendations -Avoid further nephrotoxic medications, contrast dyes, hypotension and renally dose medications -Repeat CMP in a.m.  Microcytic Anemia  -Patient's hemoglobin/hematocrit went from 10.1/33.4 and is now 9.5/31.4 -Check anemia panel in the a.m. -Monitor for signs and symptoms bleeding; currently no overt bleeding noted -Repeat CBC in a.m.  Hypokalemia -Patient's K+ has gone from 3.9 -> 3.8 -> 3.4 -Replete with po Kcl 40 mEQ  -Continue to Monitor and Repelte as Necssary -Repeat CMP in the AM   Morbid Obesity -Complicates overall prognosis and care -Estimated body mass index is 42.1 kg/m as calculated from the following:   Height as of this encounter: 5\' 3"  (1.6 m).   Weight as of this encounter: 107.8 kg.  -Weight Loss and Dietary Counseling given  DVT prophylaxis: Enoxaparin 52.5 mg Code Status: FULL CODE Family Communication: Discussed with husband at bedside  Disposition Plan: Pending further clinical improvement and clearance by cardiology  Status is: Inpatient  Remains inpatient appropriate because: Continues remain volume overloaded and will need cardiac clearance prior to safe discharge disposition  Consultants:  Cardiology  Procedures: None  Antimicrobials: Anti-infectives (From admission, onward)    None        Subjective: Seen and examined at bedside no still little short of breath and was complaining of some abdominal distention.  She denies any lightheadedness or dizziness.  Still feels distended and unable to lay flat.  Legs are not as swollen she thinks.  No other concerns or complaints at this time.  Objective: Vitals:    07/19/21 0300 07/19/21 0500 07/19/21 0800 07/19/21 1100  BP: 101/61 95/64 102/82 91/68  Pulse: 89 85 86 78  Resp: 17 20 (!) 21 (!) 24  Temp:  99 F (37.2 C) 98.8 F (37.1 C) 98.6 F (37 C)  TempSrc:   Oral Oral  SpO2: 98% 97% 99% 98%  Weight:      Height:        Intake/Output Summary (Last 24 hours) at 07/19/2021 1320 Last data filed at 07/19/2021 0032 Gross per 24 hour  Intake 240 ml  Output 1600 ml  Net -1360 ml   Filed Weights   07/18/21 1700 07/18/21 2148  Weight: 106.1 kg 107.8 kg   Examination: Physical Exam:  Constitutional: WN/WD morbidly obese African-American female currently in no acute distress appears a little uncomfortable  Eyes: Lids and conjunctivae normal, sclerae anicteric  ENMT: External Ears, Nose appear normal. Grossly normal hearing. Mucous membranes are moist.  Neck: Appears normal, supple, no cervical masses, normal ROM, no appreciable thyromegaly; no appreciable JVD Respiratory: Diminished to auscultation bilaterally with coarse breath sounds, no wheezing, rales, rhonchi or crackles. Normal respiratory effort and patient is not tachypenic. No accessory muscle use.  Unlabored breathing and not wearing supplemental oxygen nasal cannula Cardiovascular: RRR, no murmurs / rubs / gallops. S1 and S2 auscultated.  Has 1+ lower extremity edema Abdomen: Soft, non-tender, distended secondary body habitus.  Bowel sounds positive.  GU: Deferred. Musculoskeletal: No clubbing / cyanosis of digits/nails.  No joint deformity upper and lower extremities. Skin: No rashes, lesions, ulcers on limited skin evaluation. No induration; Warm and dry.  Neurologic: CN 2-12 grossly intact with no focal deficits. Romberg sign and cerebellar reflexes not assessed.  Psychiatric: Normal judgment and insight. Alert and oriented x 3. Normal mood and appropriate affect.   Data Reviewed: I have personally reviewed following labs and imaging studies  CBC: Recent Labs  Lab  07/18/21 1647 07/19/21 0717  WBC 14.3* 9.9  HGB 10.1* 9.5*  HCT 33.4* 31.4*  MCV 76.4* 75.1*  PLT 315 295   Basic Metabolic Panel: Recent Labs  Lab 07/16/21 0848 07/18/21 1647 07/19/21 0717  NA 136 133* 135  K 3.9 3.8 3.4*  CL 107 103 104  CO2 21* 21* 20*  GLUCOSE 96 101* 81  BUN 11 13 16   CREATININE 1.34* 1.43* 1.19*  CALCIUM 8.9 9.2 8.7*   GFR: Estimated Creatinine Clearance: 66.6 mL/min (A) (by C-G formula based on SCr of 1.19 mg/dL (H)). Liver Function Tests: No results for input(s): AST, ALT, ALKPHOS, BILITOT, PROT, ALBUMIN in the last 168 hours. No results for input(s): LIPASE, AMYLASE in the last 168 hours. No results for input(s): AMMONIA in the last 168 hours. Coagulation Profile: No results for input(s): INR, PROTIME in the last 168 hours. Cardiac Enzymes: No results for input(s): CKTOTAL, CKMB, CKMBINDEX, TROPONINI in the last 168 hours. BNP (last 3 results) Recent Labs    07/23/20 1000  PROBNP 312*   HbA1C: No results for input(s): HGBA1C in the last 72 hours. CBG: No results for input(s): GLUCAP in the last 168 hours. Lipid Profile: No results for input(s): CHOL, HDL, LDLCALC, TRIG, CHOLHDL, LDLDIRECT in the last 72 hours. Thyroid Function Tests: Recent Labs    07/18/21 1647  TSH 2.512   Anemia Panel: No results for input(s): VITAMINB12, FOLATE, FERRITIN, TIBC, IRON, RETICCTPCT in the last 72 hours. Sepsis Labs: No results for input(s): PROCALCITON, LATICACIDVEN in the last 168 hours.  Recent Results (from the past 240 hour(s))  Resp Panel by RT-PCR (Flu A&B, Covid) Nasopharyngeal Swab     Status: None   Collection Time: 07/18/21  6:53 PM   Specimen: Nasopharyngeal Swab; Nasopharyngeal(NP) swabs in vial transport medium  Result Value Ref Range Status   SARS Coronavirus 2 by RT PCR NEGATIVE NEGATIVE Final    Comment: (NOTE) SARS-CoV-2 target nucleic acids are NOT DETECTED.  The SARS-CoV-2 RNA is generally detectable in upper  respiratory specimens during the acute phase of infection. The lowest concentration of SARS-CoV-2 viral copies this assay can detect is 138 copies/mL. A negative result does not preclude SARS-Cov-2 infection and should not be used as the sole basis for treatment or other patient management decisions. A negative result may occur with  improper specimen collection/handling, submission of specimen other than nasopharyngeal swab, presence of viral mutation(s) within the areas targeted by this assay, and inadequate number of viral copies(<138 copies/mL). A negative result must be combined with clinical observations, patient history, and epidemiological information. The expected result is Negative.  Fact Sheet for Patients:  EntrepreneurPulse.com.au  Fact Sheet for Healthcare Providers:  IncredibleEmployment.be  This test is no t yet approved or cleared by the [place] FDA and  has been authorized for detection and/or diagnosis of SARS-CoV-2 by FDA under an Emergency Use Authorization (EUA). This EUA will remain  in effect (meaning this test can be used) for the duration of the COVID-19 declaration under Section 564(b)(1) of the Act, 21 U.S.C.section 360bbb-3(b)(1), unless  the authorization is terminated  or revoked sooner.       Influenza A by PCR NEGATIVE NEGATIVE Final   Influenza B by PCR NEGATIVE NEGATIVE Final    Comment: (NOTE) The Xpert Xpress SARS-CoV-2/FLU/RSV plus assay is intended as an aid in the diagnosis of influenza from Nasopharyngeal swab specimens and should not be used as a sole basis for treatment. Nasal washings and aspirates are unacceptable for Xpert Xpress SARS-CoV-2/FLU/RSV testing.  Fact Sheet for Patients: EntrepreneurPulse.com.au  Fact Sheet for Healthcare Providers: IncredibleEmployment.be  This test is not yet approved or cleared by the [place] FDA and has been  authorized for detection and/or diagnosis of SARS-CoV-2 by FDA under an Emergency Use Authorization (EUA). This EUA will remain in effect (meaning this test can be used) for the duration of the COVID-19 declaration under Section 564(b)(1) of the Act, 21 U.S.C. section 360bbb-3(b)(1), unless the authorization is terminated or revoked.  Performed at [facility], [address] [place], [place] [address]     RN Pressure Injury Documentation:     Estimated body mass index is 42.1 kg/m as calculated from the following:   Height as of this encounter: 5\' 3"  (1.6 m).   Weight as of this encounter: 107.8 kg.  Malnutrition Type:   Malnutrition Characteristics:   Nutrition Interventions:    Radiology Studies: DG Chest 2 View  Result Date: 07/18/2021 CLINICAL DATA:  Shortness of breath EXAM: CHEST - 2 VIEW COMPARISON:  04/19/2020 FINDINGS: Left AICD remains in place, unchanged. Cardiomegaly, vascular congestion. No confluent opacities or effusions. No edema. No acute bony abnormality. IMPRESSION: Cardiomegaly, vascular congestion. Electronically Signed   By: Rolm Baptise M.D.   On: 07/18/2021 18:28    Scheduled Meds:  amiodarone  200 mg Oral Daily   aspirin EC  81 mg Oral Daily   bumetanide (BUMEX) IV  1 mg Intravenous Q12H   carvedilol  6.25 mg Oral BID WC   dapagliflozin propanediol  10 mg Oral Daily   digoxin  0.125 mg Oral Daily   enoxaparin (LOVENOX) injection  0.5 mg/kg Subcutaneous Q24H   levothyroxine  25 mcg Oral QAC breakfast   potassium chloride SA  40 mEq Oral BID   quiniDINE gluconate  324 mg Oral BID   rosuvastatin  40 mg Oral Daily   sacubitril-valsartan  1 tablet Oral BID   spironolactone  25 mg Oral QHS   ticagrelor  90 mg Oral BID   Continuous Infusions:   LOS: 1 day   Kerney Elbe, DO [facility] PAGER is on AMION  If 7PM-7AM, please contact night-coverage [email or web address]

## 2021-07-19 NOTE — Consult Note (Addendum)
° °  Heart Failure Nurse Navigator Note  HFrEF 25%.  Severely dilated left ventricle.  2 diastolic dysfunction left ventricular longitudinal strain is abnormal.  Normal right ventricular systolic function.  She presented originally to urgent care and was directed to the emergency room  due to concern of fluid overload.  Chest x-ray in the emergency room revealed cardiomegaly and vascular congestion.  BNP was 1336.  Comorbidities:  Hypertension  Hypothyroidism Morbid obesity Severe obstructive sleep apnea Nonischemic cardiomyopathy. Nonsustained ventricular tachycardia CVA  ICD  Medications:  Bumex 1 mg IV every 12 hours Aspirin 81 mg daily Amiodarone 200 mg daily Carvedilol 6.25 mg twice a day Digoxin 0.125 mg daily Entresto 24/26 mg 1 tablet twice a day Crestor 40 mg daily Spironolactone 25 mg at at bedtime Farxiga 10 mg daily Synthroid 25 mcg every a.m. Brilinta 90 mg twice daily Potassium chloride 40 mEq twice daily  Labs:  Sodium 135, potassium 3.4, chloride 104, CO2 20, BUN 16, creatinine 1.19, WBC 9.9, hemoglobin 9.5, hematocrit 31.4, total cholesterol 196, triglycerides 85, HDL 62, LDL 119.   Initial meeting with the patient the emergency room, her husband was present.  She is lying on a gurney in no acute distress.  Follows with the advanced heart failure clinic in [place].  July 09, 2021 OptiVol thoracic impedance suggested creasing accumulation of fluid.  Torsemide was increased to 80 mg twice daily.  Then on January 23 readings  that the fluid level had returned to normal.  Felt that she had been compliant with her sodium restriction and her fluid restriction.  She denied use of salt at the table.  She had been taking her medications as prescribed, torsemide she was taking 80 mg in the morning and 80 mg in the evening.  She did not notice any swelling in her feet but did feel that she may have had some swelling in her abdomen but not as much as she has  noted before  when she would palpate her abdomen and it would seem hard.  States she had also noted not much of an appetite.  She is able to ambulate around her home without any increasing shortness of breath but when she climbs the stairs she does have to stop and rest due to shortness of breath but that is not a new finding.  She states that she always sleeps with the head of the bed elevated.  They had no further questions.  She has a 3% no-show ratio which is 10 out of 314 appointments.  Pricilla Riffle RN [facility]

## 2021-07-19 NOTE — TOC Initial Note (Signed)
Transition of Care [facility] - Initial/Assessment Note    Patient Details  Name: [name] MRN: [id number] Date of Birth: 1971-03-04  Transition [facility] CM/SW Contact:    Shelbie Hutching, RN Phone Number: 07/19/2021, 3:50 PM  Clinical Narrative:                 Patient admitted to the hospital with Acute CHF, history of systolic CHF and stroke in the past.  RNCM met with patient at the bedside introduced self and explained role in discharge planning.  Patient lives with her husband 2 adult children and one grandchild, she is independent at home, drives.  She is current with Cardiology Dr. [name].  Patient does not currently have a PCP, she used to see Dr. Lovie Macadamia but that was before the pandemic started.  Encouraged patient to get a PCP, [facility] usually has availability within a week.   Patient has a CPAP machine and is compliant with use.   TOC will follow for needs. No needs identified at this time.    Expected Discharge Plan: Home/Self Care Barriers to Discharge: Continued Medical Work up   Patient Goals and CMS Choice        Expected Discharge Plan and Services Expected Discharge Plan: Home/Self Care   Discharge Planning Services: CM Consult   Living arrangements for the past 2 months: Single Family Home                 DME Arranged: N/A DME Agency: NA       HH Arranged: NA HH Agency: NA        Prior Living Arrangements/Services Living arrangements for the past 2 months: Single Family Home Lives with:: Spouse, Adult Children Patient language and need for interpreter reviewed:: Yes Do you feel safe going back to the place where you live?: Yes      Need for Family Participation in Patient Care: Yes (Comment) (CHF) Care giver support system in place?: Yes (comment) (husband)   Criminal Activity/Legal Involvement Pertinent to Current Situation/Hospitalization: No - Comment as needed  Activities of Daily Living Home Assistive  Devices/Equipment: Blood pressure cuff ADL Screening (condition at time of admission) Patient's cognitive ability adequate to safely complete daily activities?: Yes Is the patient deaf or have difficulty hearing?: No Does the patient have difficulty seeing, even when wearing glasses/contacts?: No Does the patient have difficulty concentrating, remembering, or making decisions?: Yes Patient able to express need for assistance with ADLs?: Yes Does the patient have difficulty dressing or bathing?: Yes Independently performs ADLs?: Yes (appropriate for developmental age) Does the patient have difficulty walking or climbing stairs?: Yes Weakness of Legs: Right (from stroke) Weakness of Arms/Hands: None  Permission Sought/Granted Permission sought to share information with : Case Manager, Family Supports Permission granted to share information with : Yes, Verbal Permission Granted  Share Information with NAME: Zoeie Ritter     Permission granted to share info w Relationship: husband  Permission granted to share info w Contact Information: [phone]  Emotional Assessment Appearance:: Appears stated age Attitude/Demeanor/Rapport: Engaged Affect (typically observed): Accepting Orientation: : Oriented to Self, Oriented to Place, Oriented to  Time, Oriented to Situation Alcohol / Substance Use: Not Applicable Psych Involvement: No (comment)  Admission diagnosis:  Acute CHF (congestive heart failure) (HCC) [I50.9] Patient Active Problem List   Diagnosis Date Noted   Acute CHF (congestive heart failure) ([place]) 07/18/2021   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   Stroke (cerebrum) ([place]) 04/17/2020  Middle cerebral artery embolism, 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   ICD (implantable cardioverter-defibrillator) discharge 09/27/2018   Vitamin D deficiency 10/19/2017   Insulin resistance 10/19/2017   Hypotension 06/03/2016   Volume depletion 06/03/2016   Preoperative cardiovascular  examination 10/25/2014   Hypothyroidism 04/26/2014   Pulmonary hypertension ([place]) 02/07/2014   [employer] ICD lead RECALL  6949    UARS (upper airway resistance syndrome) 09/04/2011   Encounter for fitting or adjustment of implantable cardioverter-defibrillator (ICD)    Obesity 03/12/2009   NICM (nonischemic cardiomyopathy) ([place]) 08/65/7846   SYSTOLIC HEART FAILURE, CHRONIC 03/12/2009   PCP:  Patient, No Pcp Per (Inactive) Pharmacy:   [facility], [address] [place] [address] Phone: [phone] Fax: [phone]  [facility] [address] [place] [address] Phone: [phone] Fax: [phone]     Social Determinants of Health (SDOH) Interventions    Readmission Risk Interventions Readmission Risk Prevention Plan 07/19/2021 04/20/2020  Transportation Screening Complete Complete  PCP or Specialist Appt within 5-7 Days Complete Complete  Home Care Screening Complete Complete  Medication Review (RN CM) Complete Complete  Some recent data might be hidden

## 2021-07-19 NOTE — Progress Notes (Addendum)
Cross Cover Patient concerned with poor response to lasix therapy. Also continues to feel chest discomfort and fulllness. Home torsemide dose is 80 mg bid. Lasix dose would be approx 160 mg bid. Discussed with Dr Sidney Ace. Lasix discontinued, Bumex 1mg  BID started

## 2021-07-20 ENCOUNTER — Inpatient Hospital Stay: Payer: BC Managed Care – PPO

## 2021-07-20 DIAGNOSIS — I5041 Acute combined systolic (congestive) and diastolic (congestive) heart failure: Secondary | ICD-10-CM | POA: Diagnosis not present

## 2021-07-20 LAB — COMPREHENSIVE METABOLIC PANEL
ALT: 52 U/L — ABNORMAL HIGH (ref 0–44)
AST: 49 U/L — ABNORMAL HIGH (ref 15–41)
Albumin: 3.1 g/dL — ABNORMAL LOW (ref 3.5–5.0)
Alkaline Phosphatase: 79 U/L (ref 38–126)
Anion gap: 8 (ref 5–15)
BUN: 17 mg/dL (ref 6–20)
CO2: 24 mmol/L (ref 22–32)
Calcium: 9 mg/dL (ref 8.9–10.3)
Chloride: 105 mmol/L (ref 98–111)
Creatinine, Ser: 1.4 mg/dL — ABNORMAL HIGH (ref 0.44–1.00)
GFR, Estimated: 46 mL/min — ABNORMAL LOW (ref >60)
Glucose, Bld: 90 mg/dL (ref 70–99)
Potassium: 4.4 mmol/L (ref 3.5–5.1)
Sodium: 137 mmol/L (ref 135–145)
Total Bilirubin: 0.6 mg/dL (ref 0.3–1.2)
Total Protein: 6.9 g/dL (ref 6.5–8.1)

## 2021-07-20 LAB — CBC WITH DIFFERENTIAL/PLATELET
Abs Immature Granulocytes: 0.06 10*3/uL (ref 0.00–0.07)
Basophils Absolute: 0 10*3/uL (ref 0.0–0.1)
Basophils Relative: 0 %
Eosinophils Absolute: 0 10*3/uL (ref 0.0–0.5)
Eosinophils Relative: 0 %
HCT: 34.9 % — ABNORMAL LOW (ref 36.0–46.0)
Hemoglobin: 10.5 g/dL — ABNORMAL LOW (ref 12.0–15.0)
Immature Granulocytes: 1 %
Lymphocytes Relative: 14 %
Lymphs Abs: 1.5 10*3/uL (ref 0.7–4.0)
MCH: 22.7 pg — ABNORMAL LOW (ref 26.0–34.0)
MCHC: 30.1 g/dL (ref 30.0–36.0)
MCV: 75.5 fL — ABNORMAL LOW (ref 80.0–100.0)
Monocytes Absolute: 1.4 10*3/uL — ABNORMAL HIGH (ref 0.1–1.0)
Monocytes Relative: 13 %
Neutro Abs: 7.5 10*3/uL (ref 1.7–7.7)
Neutrophils Relative %: 72 %
Platelets: 312 10*3/uL (ref 150–400)
RBC: 4.62 MIL/uL (ref 3.87–5.11)
RDW: 16.7 % — ABNORMAL HIGH (ref 11.5–15.5)
WBC: 10.5 10*3/uL (ref 4.0–10.5)
nRBC: 0 % (ref 0.0–0.2)

## 2021-07-20 LAB — DIGOXIN LEVEL: Digoxin Level: 0.8 ng/mL (ref 0.8–2.0)

## 2021-07-20 LAB — PHOSPHORUS: Phosphorus: 3.5 mg/dL (ref 2.5–4.6)

## 2021-07-20 LAB — MAGNESIUM: Magnesium: 2.1 mg/dL (ref 1.7–2.4)

## 2021-07-20 MED ORDER — IPRATROPIUM-ALBUTEROL 0.5-2.5 (3) MG/3ML IN SOLN
3.0000 mL | RESPIRATORY_TRACT | Status: DC | PRN
  Administered 2021-07-20 – 2021-07-21 (×2): 3 mL via RESPIRATORY_TRACT
  Filled 2021-07-20 (×2): qty 3

## 2021-07-20 MED ORDER — FUROSEMIDE 10 MG/ML IJ SOLN
40.0000 mg | Freq: Two times a day (BID) | INTRAMUSCULAR | Status: DC
  Administered 2021-07-20 – 2021-07-22 (×4): 40 mg via INTRAVENOUS
  Filled 2021-07-20 (×4): qty 4

## 2021-07-20 NOTE — Progress Notes (Signed)
PROGRESS NOTE    RHIANON ZABAWA  OJJ:[id number] DOB: [birth year] DOA: 07/18/2021 PCP: Patient, No Pcp Per (Inactive)   Brief Narrative:  The patient is a 51 year old African-American female with a past medical history significant for [place] to hypothyroidism, CAD, history of nonischemic cardiomyopathy status post AICD placement, history of peptic ulcer disease, chronic systolic and diastolic CHF as well as other comorbidities who presented to the ED with acute onset of dyspnea as well as orthopnea and bilateral lower extremity swelling with dyspnea on exertion and abdominal distention and swelling has been worsening over last 3 weeks and has significantly gotten worse over several days.  Over the last week her diuretic therapy had been increased torsemide 4 tablets 2 at night.  She had been having worsening cough productive for clear sputum the last few days.  She felt that she is still retaining fluids and has been compliant with her medications.  She denied chest pain or palpitations.  No nausea vomiting or abdominal pain but she did describe abdominal distention.  She was seen in urgent care yesterday and was advised to come to the ED.  Upon arrival to the ED she had an elevated heart rate and respiratory rate.  She had mild hyponatremia and slightly elevated creatinine compared to her prior value on 07/16/2021.  EKG showed sinus tachycardia and chest x-ray showed cardiomegaly with vascular congestion.  She is given IV Lasix 60 mg and admitted to cardiac telemetry for further evaluation management and cardiology was consulted for assistance.  Her diuresis was changed to IV Bumex 1 mg p.o. twice daily and then subsequently changed to 80 mg of Lasix twice daily but now she will be decreased to IV 40 twice daily.  Assessment & Plan:   Principal Problem:   Acute CHF (congestive heart failure) (HCC)  Acute on chronic combined systolic and diastolic CHF with history of nonischemic cardiomyopathy status  post AICD placement -He was admitted to cardiac telemetry bed -She was initiated on IV diuresis with IV Lasix 60 mg and was changed to IV Bumex 1 mg twice daily and then subsequently changed to IV Lasix 80 mg po BID  -Troponins never checked for some reason so we will check now -BNP was 1331.6 -Had a 2D echo in May of 2022 with a EF of 25% and grade 2 diastolic dysfunction with a moderately dilated left atrium and mild to moderate mitral regurg -Cardiology is being consulted for further evaluation and is aware -Strict I's and O's and daily weights -Torsemide was recently increased to 80 mg twice daily and ICU monitoring on 07/15/2020 showed normal fluid levels -Presented yesterday with increased shortness of breath and elevated BNP with increased vascular congestion on chest x-ray -Her home meds include torsemide 80 mg p.o. twice daily, Entresto 24-26 mg p.o. twice daily, carvedilol 6.25 mg p.o. twice daily, small [place] 25 mg daily, digoxin 0.625 mg p.o. daily and Farxiga 10 mg Daily-she is given IV Lasix 60 mg and then transition to IV Bumex 1 mg twice daily given that she was concerned with the poor response to Lasix therapy but then changed back to IV Lasix 80 mg twice daily; because our slight bump in creatinine cardiology is now decreased the diuresis to 40 mg IV twice daily and regular.  Her echocardiogram -Currently we will continue her amiodarone, her Bumex, carvedilol, digoxin, Entresto, and dapagliflozin as well as spironolactone 25 mg p.o. nightly -We will defer diuresis to cardiology and continue monitoring her renal function carefully -Continue  monitor for signs and symptoms of volume overload and repeat chest x-ray this morning shows no acute cardiopulmonary abnormality noted  Hypothyroidism -Checked TSH and was 2.512 -Continue with levothyroxine 25 mcg p.o. daily  Dyslipidemia/hyperlipidemia -Continue statin therapy with Rosuvastatin 40 mg.  History of nonsustained ventricular  tachycardia PVCs -Continue on her home amiodarone 200 mg p.o. daily and quinidine gluconate 324 mg p.o. twice daily -Continue to monitor on telemetry  Leukocytosis, improved -Likely reactive in the setting of above and is improved -Patient's WBC went from 14.8 is now 9.9 yesterday and today is 10.5 -Continue to monitor for signs and symptoms infection; no  Hypertension -Continuing her carvedilol may need to hold her medications given her low blood pressure cardiology feels that we can continue her digoxin, Entresto, spironolactone and carvedilol -Continue monitor blood pressures per protocol -Last blood pressure reading was 90/73  History of CVA status post thrombectomy and stent placement -Continuing Aspirin 81 mg po Daily and Ticagrelor 90 mg p.o. twice daily as well as rosuvastatin 40 mg p.o. daily  AKI on CKD Stage 3a -Patient's BUNs/creatinine went from 11/1.34 -> 13/1.43 -> 16/1.19 and is now slightly bumped and gone to 17/1.40 -Continue with diuresis as above per cardiology recommendations -Avoid further nephrotoxic medications, contrast dyes, hypotension and renally dose medications -Repeat CMP in a.m.  Abnormal LFTs -AST is mildly elevated at 49 and ALT is 52 -Continue monitor and trend and repeat CMP in a.m. and if necessary will obtain a right upper quadrant ultrasound as well as an acute hepatitis panel  Microcytic Anemia  -Patient's hemoglobin/hematocrit went from 10.1/33.4 and is now 9.5/31.4 and is now 10.5/34.9 -Check anemia panel in the a.m. -Monitor for signs and symptoms bleeding; currently no overt bleeding noted -Repeat CBC in a.m.  Hypokalemia -Patient's K+ has gone from 3.9 -> 3.8 -> 3.4 is now 4.4 -Continue to Monitor and Repelte as Necssary -Repeat CMP in the AM   Morbid Obesity -Complicates overall prognosis and care -Estimated body mass index is 41.54 kg/m as calculated from the following:   Height as of this encounter: 5\' 3"  (1.6 m).   Weight as  of this encounter: 106.4 kg.  -Weight Loss and Dietary Counseling given  DVT prophylaxis: Enoxaparin 52.5 mg Code Status: FULL CODE Family Communication: Discussed with husband at bedside  Disposition Plan: Pending further clinical improvement and clearance by cardiology  Status is: Inpatient  Remains inpatient appropriate because: Continues remain volume overloaded and will need cardiac clearance prior to safe discharge disposition  Consultants:  Cardiology  Procedures:  ECHOCARDIOGRAM  Antimicrobials: Anti-infectives (From admission, onward)    None        Subjective: Seen and examined at bedside and she is still little short of breath.  Abdominal distention is improving she feels little bit better but still does not feel 100% back to her baseline.  Denies any nausea or vomiting.  Has been up and ambulating and urinating quite frequently.  No other concerns or complaints at this time.  Objective: Vitals:   07/20/21 0350 07/20/21 0500 07/20/21 0741 07/20/21 1133  BP: (!) 104/59  (!) 93/56 90/73  Pulse: 77  79 83  Resp: 16  19 16   Temp: (!) 97.5 F (36.4 C)  (!) 97.5 F (36.4 C) 97.9 F (36.6 C)  TempSrc: Oral   Oral  SpO2: 99%  99% 100%  Weight:  106.4 kg    Height:        Intake/Output Summary (Last 24 hours) at 07/20/2021 1337  Last data filed at 07/20/2021 1100 Gross per 24 hour  Intake 0 ml  Output 2650 ml  Net -2650 ml    Filed Weights   07/18/21 1700 07/18/21 2148 07/20/21 0500  Weight: 106.1 kg 107.8 kg 106.4 kg   Examination: Physical Exam:  Constitutional: WN/WD morbidly obese AAF in NAD and appears calm  Eyes:  Lids and conjunctivae normal, sclerae anicteric  ENMT: External Ears, Nose appear normal. Grossly normal hearing. Mucous membranes are moist.  Neck: Appears normal, supple, no cervical masses, normal ROM, no appreciable thyromegaly; no appreciable JVD Respiratory: Diminished to auscultation bilaterally with coarse breath sounds and some  crackles, no wheezing, rales, rhonchi. Normal respiratory effort and patient is not tachypenic. No accessory muscle use.  Unlabored breathing and not wearing supplemental oxygen nasal cannula Cardiovascular: RRR, no murmurs / rubs / gallops. S1 and S2 auscultated.  1+ extremity edema Abdomen: Soft, non-tender, distended secondary body habitus. Bowel sounds positive.  GU: Deferred. Musculoskeletal: No clubbing / cyanosis of digits/nails. No joint deformity upper and lower extremities.  Skin: No rashes, lesions, ulcers on limited skin evaluation. No induration; Warm and dry.  Neurologic: CN 2-12 grossly intact with no focal deficits. Romberg sign and cerebellar reflexes not assessed.  Psychiatric: Normal judgment and insight. Alert and oriented x 3. Normal mood and appropriate affect.   Data Reviewed: I have personally reviewed following labs and imaging studies  CBC: Recent Labs  Lab 07/18/21 1647 07/19/21 0717 07/20/21 0427  WBC 14.3* 9.9 10.5  NEUTROABS  --   --  7.5  HGB 10.1* 9.5* 10.5*  HCT 33.4* 31.4* 34.9*  MCV 76.4* 75.1* 75.5*  PLT 315 279 893    Basic Metabolic Panel: Recent Labs  Lab 07/16/21 0848 07/18/21 1647 07/19/21 0717 07/20/21 0427  NA 136 133* 135 137  K 3.9 3.8 3.4* 4.4  CL 107 103 104 105  CO2 21* 21* 20* 24  GLUCOSE 96 101* 81 90  BUN 11 13 16 17   CREATININE 1.34* 1.43* 1.19* 1.40*  CALCIUM 8.9 9.2 8.7* 9.0  MG  --   --   --  2.1  PHOS  --   --   --  3.5    GFR: Estimated Creatinine Clearance: 56.2 mL/min (A) (by C-G formula based on SCr of 1.4 mg/dL (H)). Liver Function Tests: Recent Labs  Lab 07/20/21 0427  AST 49*  ALT 52*  ALKPHOS 79  BILITOT 0.6  PROT 6.9  ALBUMIN 3.1*   No results for input(s): LIPASE, AMYLASE in the last 168 hours. No results for input(s): AMMONIA in the last 168 hours. Coagulation Profile: No results for input(s): INR, PROTIME in the last 168 hours. Cardiac Enzymes: No results for input(s): CKTOTAL, CKMB,  CKMBINDEX, TROPONINI in the last 168 hours. BNP (last 3 results) Recent Labs    07/23/20 1000  PROBNP 312*    HbA1C: No results for input(s): HGBA1C in the last 72 hours. CBG: No results for input(s): GLUCAP in the last 168 hours. Lipid Profile: No results for input(s): CHOL, HDL, LDLCALC, TRIG, CHOLHDL, LDLDIRECT in the last 72 hours. Thyroid Function Tests: Recent Labs    07/18/21 1647  TSH 2.512    Anemia Panel: No results for input(s): VITAMINB12, FOLATE, FERRITIN, TIBC, IRON, RETICCTPCT in the last 72 hours. Sepsis Labs: No results for input(s): PROCALCITON, LATICACIDVEN in the last 168 hours.  Recent Results (from the past 240 hour(s))  Resp Panel by RT-PCR (Flu A&B, Covid) Nasopharyngeal Swab  Status: None   Collection Time: 07/18/21  6:53 PM   Specimen: Nasopharyngeal Swab; Nasopharyngeal(NP) swabs in vial transport medium  Result Value Ref Range Status   SARS Coronavirus 2 by RT PCR NEGATIVE NEGATIVE Final    Comment: (NOTE) SARS-CoV-2 target nucleic acids are NOT DETECTED.  The SARS-CoV-2 RNA is generally detectable in upper respiratory specimens during the acute phase of infection. The lowest concentration of SARS-CoV-2 viral copies this assay can detect is 138 copies/mL. A negative result does not preclude SARS-Cov-2 infection and should not be used as the sole basis for treatment or other patient management decisions. A negative result may occur with  improper specimen collection/handling, submission of specimen other than nasopharyngeal swab, presence of viral mutation(s) within the areas targeted by this assay, and inadequate number of viral copies(<138 copies/mL). A negative result must be combined with clinical observations, patient history, and epidemiological information. The expected result is Negative.  Fact Sheet for Patients:  EntrepreneurPulse.com.au  Fact Sheet for Healthcare Providers:   IncredibleEmployment.be  This test is no t yet approved or cleared by the [place] FDA and  has been authorized for detection and/or diagnosis of SARS-CoV-2 by FDA under an Emergency Use Authorization (EUA). This EUA will remain  in effect (meaning this test can be used) for the duration of the COVID-19 declaration under Section 564(b)(1) of the Act, 21 U.S.C.section 360bbb-3(b)(1), unless the authorization is terminated  or revoked sooner.       Influenza A by PCR NEGATIVE NEGATIVE Final   Influenza B by PCR NEGATIVE NEGATIVE Final    Comment: (NOTE) The Xpert Xpress SARS-CoV-2/FLU/RSV plus assay is intended as an aid in the diagnosis of influenza from Nasopharyngeal swab specimens and should not be used as a sole basis for treatment. Nasal washings and aspirates are unacceptable for Xpert Xpress SARS-CoV-2/FLU/RSV testing.  Fact Sheet for Patients: EntrepreneurPulse.com.au  Fact Sheet for Healthcare Providers: IncredibleEmployment.be  This test is not yet approved or cleared by the [place] FDA and has been authorized for detection and/or diagnosis of SARS-CoV-2 by FDA under an Emergency Use Authorization (EUA). This EUA will remain in effect (meaning this test can be used) for the duration of the COVID-19 declaration under Section 564(b)(1) of the Act, 21 U.S.C. section 360bbb-3(b)(1), unless the authorization is terminated or revoked.  Performed at [facility], [address] [place], [place] [address]      RN Pressure Injury Documentation:     Estimated body mass index is 41.54 kg/m as calculated from the following:   Height as of this encounter: 5\' 3"  (1.6 m).   Weight as of this encounter: 106.4 kg.  Malnutrition Type:   Malnutrition Characteristics:   Nutrition Interventions:    Radiology Studies: DG Chest 2 View  Result Date: 07/18/2021 CLINICAL DATA:  Shortness of breath  EXAM: CHEST - 2 VIEW COMPARISON:  04/19/2020 FINDINGS: Left AICD remains in place, unchanged. Cardiomegaly, vascular congestion. No confluent opacities or effusions. No edema. No acute bony abnormality. IMPRESSION: Cardiomegaly, vascular congestion. Electronically Signed   By: Rolm Baptise M.D.   On: 07/18/2021 18:28   DG Chest Port 1 View  Result Date: 07/20/2021 CLINICAL DATA:  Shortness of breath EXAM: PORTABLE CHEST 1 VIEW COMPARISON:  07/18/2021 FINDINGS: Cardiac shadow remains enlarged. Defibrillator is again noted and stable. Lungs are clear bilaterally. No bony abnormality is seen. IMPRESSION: No acute abnormality noted. Electronically Signed   By: Inez Catalina M.D.   On: 07/20/2021 00:26  Scheduled Meds:  amiodarone  200 mg Oral Daily   aspirin EC  81 mg Oral Daily   carvedilol  6.25 mg Oral BID WC   dapagliflozin propanediol  10 mg Oral Daily   digoxin  0.125 mg Oral Daily   enoxaparin (LOVENOX) injection  0.5 mg/kg Subcutaneous Q24H   furosemide  40 mg Intravenous BID   levothyroxine  25 mcg Oral QAC breakfast   potassium chloride SA  40 mEq Oral BID   quiniDINE gluconate  324 mg Oral BID   rosuvastatin  40 mg Oral Daily   sacubitril-valsartan  1 tablet Oral BID   spironolactone  25 mg Oral QHS   ticagrelor  90 mg Oral BID   Continuous Infusions:   LOS: 2 days   Kerney Elbe, DO [facility] PAGER is on AMION  If 7PM-7AM, please contact night-coverage [email or web address]

## 2021-07-20 NOTE — Progress Notes (Signed)
Patient Name: [name]   Patient Profile:    a,   SUBJECTIVE: MAZIE FENCL is a 51 y.o. female  with NICM--EF 25%, s/p ICD w  recurrent VT, monomorphic and polymorphic, Rx with amio and quinidine admitted with SOB and found to have A/C CHF Also hx of morbid obesity, HTN, sleep apnea  Still complaining of shortness of breath but no at rest or lying down.  Urinating quite well. Past Medical History:  Diagnosis Date   Automatic implantable cardioverter-defibrillator in situ    Chronic CHF (congestive heart failure) ([place])    a. EF 15-20% b. RHC (09/2013) RA 14, RV 57/22, PA 64/36 (48), PCWP 18, FIck CO/CI 3.7/1.6, PVR 8.1 WU, PA sat 47%    History of stomach ulcers    Hypertension    Hypotension    Hypothyroidism    Morbid obesity ([place])    Myocardial infarction ([place]) 08/2013   Nocturnal dyspnea    Nonischemic cardiomyopathy (HCC)    Sinus tachycardia    Sleep apnea    Snoring-prob OSA 09/04/2011   [employer] ICD lead RECALL  6949    UARS (upper airway resistance syndrome) 09/04/2011   HST 12/2013:  AHI 4/hr (numerous episodes of airflow reduction that did not have concomitant desaturation)     Scheduled Meds:  Scheduled Meds:  amiodarone  200 mg Oral Daily   aspirin EC  81 mg Oral Daily   carvedilol  6.25 mg Oral BID WC   dapagliflozin propanediol  10 mg Oral Daily   digoxin  0.125 mg Oral Daily   enoxaparin (LOVENOX) injection  0.5 mg/kg Subcutaneous Q24H   furosemide  80 mg Intravenous BID   levothyroxine  25 mcg Oral QAC breakfast   potassium chloride SA  40 mEq Oral BID   quiniDINE gluconate  324 mg Oral BID   rosuvastatin  40 mg Oral Daily   sacubitril-valsartan  1 tablet Oral BID   spironolactone  25 mg Oral QHS   ticagrelor  90 mg Oral BID   Continuous Infusions: acetaminophen **OR** acetaminophen, guaiFENesin-dextromethorphan, magnesium hydroxide, ondansetron **OR** ondansetron (ZOFRAN) IV, traZODone    PHYSICAL EXAM Vitals:   07/19/21 2343  07/20/21 0350 07/20/21 0500 07/20/21 0741  BP: 115/69 (!) 104/59  (!) 93/56  Pulse: 85 77  79  Resp: 20 16  19   Temp: 98.2 F (36.8 C) (!) 97.5 F (36.4 C)  (!) 97.5 F (36.4 C)  TempSrc: Oral Oral    SpO2: 95% 99%  99%  Weight:   106.4 kg   Height:       BP (!) 93/56 (BP Location: Right Arm)    Pulse 79    Temp (!) 97.5 F (36.4 C)    Resp 19    Ht 5\' 3"  (1.6 m)    Wt 106.4 kg Comment: standing   SpO2 99%    BMI 41.54 kg/m  Well developed and nourished in no acute distress HENT normal Neck supple with JVP- 6-8 cm Clear Regular rate and rhythm, no murmurs or gallops Abd-soft with active BS No Clubbing cyanosis tr edema Skin-warm and dry A & Oriented  Grossly normal sensory and motor function  Telemetry personally reviewed.  Sinus with PVCs at a frequency of about 3%      Intake/Output Summary (Last 24 hours) at 07/20/2021 0944 Last data filed at 07/19/2021 1742 Gross per 24 hour  Intake --  Output 1900 ml  Net -1900 ml  LABS: Basic Metabolic Panel: Recent Labs  Lab 07/16/21 0848 07/18/21 1647 07/19/21 0717 07/20/21 0427  NA 136 133* 135 137  K 3.9 3.8 3.4* 4.4  CL 107 103 104 105  CO2 21* 21* 20* 24  GLUCOSE 96 101* 81 90  BUN 11 13 16 17   CREATININE 1.34* 1.43* 1.19* 1.40*  CALCIUM 8.9 9.2 8.7* 9.0  MG  --   --   --  2.1  PHOS  --   --   --  3.5   Dig Level  0.8    CBC: Recent Labs  Lab 07/18/21 1647 07/19/21 0717 07/20/21 0427  WBC 14.3* 9.9 10.5  NEUTROABS  --   --  7.5  HGB 10.1* 9.5* 10.5*  HCT 33.4* 31.4* 34.9*  MCV 76.4* 75.1* 75.5*  PLT 315 279 312   PROTIME: No results for input(s): LABPROT, INR in the last 72 hours. Liver Function Tests: Recent Labs    07/20/21 0427  AST 49*  ALT 52*  ALKPHOS 79  BILITOT 0.6  PROT 6.9  ALBUMIN 3.1*   No results for input(s): LIPASE, AMYLASE in the last 72 hours. BNP: BNP (last 3 results) Recent Labs    03/26/21 0937 07/18/21 1647  BNP 343.7* 1,331.6*    ProBNP (last 3  results) Recent Labs    07/23/20 1000  PROBNP 312*    Thyroid Function Tests: Recent Labs    07/18/21 1647  TSH 2.512   Anemia Panel: No results for input(s): VITAMINB12, FOLATE, FERRITIN, TIBC, IRON, RETICCTPCT in the last 72 hours.   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  The information was reviewed. No changes were made in the programming.         High Risk Medication Surveillance --amio/quinidine   MR/TR   Stroke with intracranial stenting on DAPT   Cr starting to increase-- will decrease furosemide to 40 IV BID  PVC frequency is better.  We will repeat her echocardiogram. Blood pressure is low but we will continue her on digoxin (dig level 0.8) Entresto, spironolactone and carvedilol.  Remains on DAPT for intracranial stenting       Signed, Virl Axe MD  07/20/2021

## 2021-07-21 ENCOUNTER — Inpatient Hospital Stay (HOSPITAL_COMMUNITY)
Admit: 2021-07-21 | Discharge: 2021-07-21 | Disposition: A | Discharge status: Home / Self Care | Payer: BC Managed Care – PPO | Attending: Internal Medicine | Admitting: Internal Medicine

## 2021-07-21 ENCOUNTER — Encounter: Payer: Self-pay | Admitting: Family Medicine

## 2021-07-21 DIAGNOSIS — I5043 Acute on chronic combined systolic (congestive) and diastolic (congestive) heart failure: Secondary | ICD-10-CM | POA: Diagnosis not present

## 2021-07-21 DIAGNOSIS — I5041 Acute combined systolic (congestive) and diastolic (congestive) heart failure: Secondary | ICD-10-CM | POA: Diagnosis not present

## 2021-07-21 LAB — CBC WITH DIFFERENTIAL/PLATELET
Abs Immature Granulocytes: 0.03 10*3/uL (ref 0.00–0.07)
Basophils Absolute: 0 10*3/uL (ref 0.0–0.1)
Basophils Relative: 0 %
Eosinophils Absolute: 0 10*3/uL (ref 0.0–0.5)
Eosinophils Relative: 0 %
HCT: 34.8 % — ABNORMAL LOW (ref 36.0–46.0)
Hemoglobin: 10.6 g/dL — ABNORMAL LOW (ref 12.0–15.0)
Immature Granulocytes: 0 %
Lymphocytes Relative: 24 %
Lymphs Abs: 1.9 10*3/uL (ref 0.7–4.0)
MCH: 22.4 pg — ABNORMAL LOW (ref 26.0–34.0)
MCHC: 30.5 g/dL (ref 30.0–36.0)
MCV: 73.6 fL — ABNORMAL LOW (ref 80.0–100.0)
Monocytes Absolute: 0.9 10*3/uL (ref 0.1–1.0)
Monocytes Relative: 12 %
Neutro Abs: 5 10*3/uL (ref 1.7–7.7)
Neutrophils Relative %: 64 %
Platelets: 349 10*3/uL (ref 150–400)
RBC: 4.73 MIL/uL (ref 3.87–5.11)
RDW: 16.9 % — ABNORMAL HIGH (ref 11.5–15.5)
WBC: 7.9 10*3/uL (ref 4.0–10.5)
nRBC: 0 % (ref 0.0–0.2)

## 2021-07-21 LAB — ECHOCARDIOGRAM LIMITED
AR max vel: 1.6 cm2
AV Area VTI: 1.45 cm2
AV Area mean vel: 1.65 cm2
AV Mean grad: 3 mmHg
AV Peak grad: 5.3 mmHg
Ao pk vel: 1.15 m/s
Area-P 1/2: 9.37 cm2
Calc EF: 21.6 %
Height: 63 in
MV M vel: 4.33 m/s
MV Peak grad: 75 mmHg
MV VTI: 1.19 cm2
Radius: 0.9 cm
S' Lateral: 7.5 cm
Single Plane A2C EF: 23.2 %
Single Plane A4C EF: 7.9 %
Weight: 3744 oz

## 2021-07-21 LAB — COMPREHENSIVE METABOLIC PANEL
ALT: 44 U/L (ref 0–44)
AST: 29 U/L (ref 15–41)
Albumin: 3.1 g/dL — ABNORMAL LOW (ref 3.5–5.0)
Alkaline Phosphatase: 79 U/L (ref 38–126)
Anion gap: 8 (ref 5–15)
BUN: 17 mg/dL (ref 6–20)
CO2: 23 mmol/L (ref 22–32)
Calcium: 9.1 mg/dL (ref 8.9–10.3)
Chloride: 106 mmol/L (ref 98–111)
Creatinine, Ser: 1.39 mg/dL — ABNORMAL HIGH (ref 0.44–1.00)
GFR, Estimated: 46 mL/min — ABNORMAL LOW (ref >60)
Glucose, Bld: 88 mg/dL (ref 70–99)
Potassium: 4.1 mmol/L (ref 3.5–5.1)
Sodium: 137 mmol/L (ref 135–145)
Total Bilirubin: 0.3 mg/dL (ref 0.3–1.2)
Total Protein: 6.9 g/dL (ref 6.5–8.1)

## 2021-07-21 LAB — PHOSPHORUS: Phosphorus: 4 mg/dL (ref 2.5–4.6)

## 2021-07-21 LAB — MAGNESIUM: Magnesium: 2 mg/dL (ref 1.7–2.4)

## 2021-07-21 MED ORDER — CARVEDILOL 3.125 MG PO TABS
3.1250 mg | ORAL_TABLET | Freq: Two times a day (BID) | ORAL | Status: DC
  Administered 2021-07-21: 3.125 mg via ORAL
  Filled 2021-07-21 (×2): qty 1

## 2021-07-21 MED ORDER — SPIRONOLACTONE 25 MG PO TABS
12.5000 mg | ORAL_TABLET | Freq: Every day | ORAL | Status: DC
  Administered 2021-07-21: 12.5 mg via ORAL
  Filled 2021-07-21: qty 1
  Filled 2021-07-21 (×2): qty 0.5

## 2021-07-21 MED ORDER — LOSARTAN POTASSIUM 25 MG PO TABS
25.0000 mg | ORAL_TABLET | Freq: Once | ORAL | Status: AC
  Administered 2021-07-21: 25 mg via ORAL
  Filled 2021-07-21: qty 1

## 2021-07-21 MED ORDER — PERFLUTREN LIPID MICROSPHERE
1.0000 mL | INTRAVENOUS | Status: AC | PRN
  Administered 2021-07-21: 2 mL via INTRAVENOUS
  Filled 2021-07-21: qty 10

## 2021-07-21 MED ORDER — SACUBITRIL-VALSARTAN 24-26 MG PO TABS
1.0000 | ORAL_TABLET | Freq: Two times a day (BID) | ORAL | Status: DC
  Administered 2021-07-21 – 2021-07-22 (×2): 1 via ORAL
  Filled 2021-07-21 (×2): qty 1

## 2021-07-21 NOTE — Progress Notes (Signed)
Patient Name: [name]   Patient Profile:  [name] is a 51 y.o. female  with NICM--EF 25%, s/p ICD w  recurrent VT, monomorphic and polymorphic, Rx with amio and quinidine admitted with SOB and found to have A/C CHF Also hx of morbid obesity, HTN, sleep apnea   SUBJECTIVE: Feels considerably better this morning, less shortness of breath.  Some lightheadedness upon arising from bed.  Worse than normal but typically somewhat of an issue    Past Medical History:  Diagnosis Date   Automatic implantable cardioverter-defibrillator in situ    Chronic CHF (congestive heart failure) ([place])    a. EF 15-20% b. RHC (09/2013) RA 14, RV 57/22, PA 64/36 (48), PCWP 18, FIck CO/CI 3.7/1.6, PVR 8.1 WU, PA sat 47%    History of stomach ulcers    Hypertension    Hypotension    Hypothyroidism    Morbid obesity ([place])    Myocardial infarction ([place]) 08/2013   Nocturnal dyspnea    Nonischemic cardiomyopathy ([facility])    Sinus tachycardia    Sleep apnea    Snoring-prob OSA 09/04/2011   [employer] ICD lead RECALL  6949    UARS (upper airway resistance syndrome) 09/04/2011   HST 12/2013:  AHI 4/hr (numerous episodes of airflow reduction that did not have concomitant desaturation)     Scheduled Meds:  Scheduled Meds:  amiodarone  200 mg Oral Daily   aspirin EC  81 mg Oral Daily   carvedilol  6.25 mg Oral BID WC   dapagliflozin propanediol  10 mg Oral Daily   digoxin  0.125 mg Oral Daily   enoxaparin (LOVENOX) injection  0.5 mg/kg Subcutaneous Q24H   furosemide  40 mg Intravenous BID   levothyroxine  25 mcg Oral QAC breakfast   potassium chloride SA  40 mEq Oral BID   quiniDINE gluconate  324 mg Oral BID   rosuvastatin  40 mg Oral Daily   sacubitril-valsartan  1 tablet Oral BID   spironolactone  25 mg Oral QHS   ticagrelor  90 mg Oral BID   Continuous Infusions: acetaminophen **OR** acetaminophen, guaiFENesin-dextromethorphan, ipratropium-albuterol, magnesium hydroxide,  ondansetron **OR** ondansetron (ZOFRAN) IV, traZODone    PHYSICAL EXAM Vitals:   07/21/21 0042 07/21/21 0415 07/21/21 0633 07/21/21 0807  BP: (!) 82/53 (!) 81/59  100/66  Pulse: 79 81  77  Resp: 18 18  18   Temp: 97.7 F (36.5 C) 98 F (36.7 C)  98.1 F (36.7 C)  TempSrc: Oral Oral  Oral  SpO2: 100% 100%  100%  Weight:   106.1 kg   Height:       Well developed and nourished in no acute distress HENT normal Neck supple with JVP-  flat 8-10 Clear Regular rate and rhythm, no murmurs or gallops Abd-soft with active BS No Clubbing cyanosis edema Skin-warm and dry A & Oriented  Grossly normal sensory and motor function        Intake/Output Summary (Last 24 hours) at 07/21/2021 1204 Last data filed at 07/21/2021 1100 Gross per 24 hour  Intake 840 ml  Output 1000 ml  Net -160 ml     LABS: Basic Metabolic Panel: Recent Labs  Lab 07/16/21 0848 07/18/21 1647 07/19/21 0717 07/20/21 0427 07/21/21 0539  NA 136 133* 135 137 137  K 3.9 3.8 3.4* 4.4 4.1  CL 107 103 104 105 106  CO2 21* 21* 20* 24 23  GLUCOSE 96 101* 81 90 88  BUN 11 13 16 17 17   CREATININE 1.34* 1.43* 1.19* 1.40* 1.39*  CALCIUM 8.9 9.2 8.7* 9.0 9.1  MG  --   --   --  2.1 2.0  PHOS  --   --   --  3.5 4.0    Dig Level  0.8    CBC: Recent Labs  Lab 07/18/21 1647 07/19/21 0717 07/20/21 0427 07/21/21 0539  WBC 14.3* 9.9 10.5 7.9  NEUTROABS  --   --  7.5 5.0  HGB 10.1* 9.5* 10.5* 10.6*  HCT 33.4* 31.4* 34.9* 34.8*  MCV 76.4* 75.1* 75.5* 73.6*  PLT 315 279 312 349    PROTIME: No results for input(s): LABPROT, INR in the last 72 hours. Liver Function Tests: Recent Labs    07/20/21 0427 07/21/21 0539  AST 49* 29  ALT 52* 44  ALKPHOS 79 79  BILITOT 0.6 0.3  PROT 6.9 6.9  ALBUMIN 3.1* 3.1*    No results for input(s): LIPASE, AMYLASE in the last 72 hours. BNP: BNP (last 3 results) Recent Labs    03/26/21 0937 07/18/21 1647  BNP 343.7* 1,331.6*     ProBNP (last 3  results) Recent Labs    07/23/20 1000  PROBNP 312*     Thyroid Function Tests: Recent Labs    07/18/21 1647  TSH 2.512    Anemia Panel: No results for input(s): VITAMINB12, FOLATE, FERRITIN, TIBC, IRON, RETICCTPCT in the last 72 hours.   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  The information was reviewed. No changes were made in the programming.         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  We will continue furosemide 40 IV twice daily Transaminases better suggesting less cardiac congestion  Blood pressure low, will decrease carvedilol 6.25--3.125 twice daily, spironolactone 25 at bedtime--12.5 at bedtime and will continue the Entresto in the morning, and give a dose of losartan 25 today.  Will not measure her blood pressures at night  Continue DAPT for intracranial stenting  Echo pending      Signed, Virl Axe MD  07/21/2021

## 2021-07-21 NOTE — Progress Notes (Signed)
PROGRESS NOTE    [name]  VQM:[id number] DOB: 04/25/1971 DOA: 07/18/2021 PCP: Patient, No Pcp Per (Inactive)   Brief Narrative:  The patient is a 51 year old African-American female with a past medical history significant for [place] to hypothyroidism, CAD, history of nonischemic cardiomyopathy status post AICD placement, history of peptic ulcer disease, chronic systolic and diastolic CHF as well as other comorbidities who presented to the ED with acute onset of dyspnea as well as orthopnea and bilateral lower extremity swelling with dyspnea on exertion and abdominal distention and swelling has been worsening over last 3 weeks and has significantly gotten worse over several days.  Over the last week her diuretic therapy had been increased torsemide 4 tablets 2 at night.  She had been having worsening cough productive for clear sputum the last few days.  She felt that she is still retaining fluids and has been compliant with her medications.  She denied chest pain or palpitations.  No nausea vomiting or abdominal pain but she did describe abdominal distention.  She was seen in urgent care yesterday and was advised to come to the ED.  Upon arrival to the ED she had an elevated heart rate and respiratory rate.  She had mild hyponatremia and slightly elevated creatinine compared to her prior value on 07/16/2021.  EKG showed sinus tachycardia and chest x-ray showed cardiomegaly with vascular congestion.  She is given IV Lasix 60 mg and admitted to cardiac telemetry for further evaluation management and cardiology was consulted for assistance.  Her diuresis was changed to IV Bumex 1 mg p.o. twice daily and then subsequently changed to 80 mg of Lasix twice daily but now she will be decreased to IV 40 twice daily given her rising creatinine.  Since yesterday her BUN and creatinine has been relatively stable and essentially the same cardiology given continue IV furosemide 40 mg twice daily.  Assessment &  Plan:   Principal Problem:   Acute CHF (congestive heart failure) (HCC)  Acute on chronic combined systolic and diastolic CHF with history of nonischemic cardiomyopathy status post AICD placement -He was admitted to cardiac telemetry bed -She was initiated on IV diuresis with IV Lasix 60 mg and was changed to IV Bumex 1 mg twice daily and then subsequently changed to IV Lasix 80 mg po BID and further reduced to IV 40 mg twice daily -Troponins never checked for some reason so we will check now -BNP was 1331.6 -Had a 2D echo in May of 2022 with a EF of 25% and grade 2 diastolic dysfunction with a moderately dilated left atrium and mild to moderate mitral regurg -Cardiology is being consulted for further evaluation and is aware -Strict I's and O's and daily weights -Torsemide was recently increased to 80 mg twice daily and ICU monitoring on 07/15/2020 showed normal fluid levels -Presented yesterday with increased shortness of breath and elevated BNP with increased vascular congestion on chest x-ray -Her home meds include torsemide 80 mg p.o. twice daily, Entresto 24-26 mg p.o. twice daily, carvedilol 6.25 mg p.o. twice daily, small Liken 25 mg daily, digoxin 0.625 mg p.o. daily and Farxiga 10 mg Daily-she is given IV Lasix 60 mg and then transition to IV Bumex 1 mg twice daily given that she was concerned with the poor response to Lasix therapy but then changed back to IV Lasix 80 mg twice daily; because our slight bump in creatinine cardiology is now decreased the diuresis to 40 mg IV twice daily and regular.  Her  echocardiogram is being repeated  -Repeat Limited ECHOCardiogram 07/21/21 showed "Left ventricular ejection fraction, by estimation, is 20 to 25%. The left ventricle has severely decreased function. The left ventricle demonstrates global  hypokinesis. The left ventricular internal cavity size was severely dilated. Indeterminate diastolic filling due to E-A fusion. Right ventricular systolic  function is moderately reduced. The right ventricular size is normal. There is normal pulmonary artery systolic  pressure. The mitral valve is abnormal. Severe mitral valve regurgitation. No evidence of mitral stenosis. The aortic valve was not well visualized. Aortic valve regurgitation is not visualized. No aortic stenosis is present. The inferior vena cava is normal in size with greater than 50% respiratory variability, suggesting right atrial pressure of 3 mmHg."  -Currently we will continue her amiodarone, her Bumex, carvedilol, digoxin, Entresto, and dapagliflozin as well as spironolactone 25 mg p.o. nightly; cardiology is going to decrease her carvedilol from 6.25 mg to 3.125 twice daily and spironolactone from 25 mg nightly to 12.5 nightly and will hold her evening Entresto -We will defer diuresis to cardiology and continue monitoring her renal function carefully -Continue monitor for signs and symptoms of volume overload and repeat chest x-ray this morning shows no acute cardiopulmonary abnormality noted  Hypothyroidism -Checked TSH and was 2.512 -Continue with levothyroxine 25 mcg p.o. daily  Dyslipidemia/hyperlipidemia -Continue statin therapy with Rosuvastatin 40 mg.  History of nonsustained ventricular tachycardia PVCs -Continue on her home amiodarone 200 mg p.o. daily and quinidine gluconate 324 mg p.o. twice daily -Continue to monitor on telemetry  Leukocytosis, improved -Likely reactive in the setting of above and is improved -Patient's WBC went from 14.8 -> 9.9 -> 10.5 -> 7.9 -Continue to monitor for signs and symptoms infection; no  Hypertension -We will continue home digoxin, and medication adjustments will be made by cardiology and Coreg has been reduced and spironolactone has been reduced and her Delene Loll will be held -Continue monitor blood pressures per protocol -Last blood pressure reading was 100/66  History of CVA status post thrombectomy and stent  placement -Continuing Aspirin 81 mg po Daily and Ticagrelor 90 mg p.o. twice daily as well as rosuvastatin 40 mg p.o. daily  AKI on CKD Stage 3a -Patient's BUNs/creatinine went from 11/1.34 -> 13/1.43 -> 16/1.19 and is now slightly bumped and gone to 17/1.40 -> 17/1.39 -Continue with diuresis as above per cardiology recommendations -Avoid further nephrotoxic medications, contrast dyes, hypotension and renally dose medications -Repeat CMP in a.m.  Abnormal LFTs, improved -AST is mildly elevated at 49 and ALT is 52; now AST is now 29 and ALT is now 40 -In the setting of vascular congestion and volume overload -Continue monitor and trend and repeat CMP in a.m. and if necessary will obtain a right upper quadrant ultrasound as well as an acute hepatitis panel  Microcytic Anemia  -Patient's hemoglobin/hematocrit went from 10.1/33.4 -> 9.5/31.4 -> 10.5/34.9 -> 10.6/34.8 -Check anemia panel in the a.m. -Monitor for signs and symptoms bleeding; currently no overt bleeding noted -Repeat CBC in a.m.  Hypokalemia -Patient's K+ has gone from 3.9 -> 3.8 -> 3.4 -> 4.4 -> 4.1 -Continue to Monitor and Repelte as Necssary -Repeat CMP in the AM   Morbid Obesity -Complicates overall prognosis and care -Estimated body mass index is 41.45 kg/m as calculated from the following:   Height as of this encounter: 5\' 3"  (1.6 m).   Weight as of this encounter: 106.1 kg.  -Weight Loss and Dietary Counseling given  DVT prophylaxis: Enoxaparin 52.5 mg Code Status: FULL CODE  Family Communication: Discussed with husband at bedside  Disposition Plan: Pending further clinical improvement and clearance by cardiology  Status is: Inpatient  Remains inpatient appropriate because: Continues remain volume overloaded and will need cardiac clearance prior to safe discharge disposition  Consultants:  Cardiology  Procedures:  ECHOCARDIOGRAM IMPRESSIONS     1. Left ventricular ejection fraction, by estimation, is  20 to 25%. The  left ventricle has severely decreased function. The left ventricle  demonstrates global hypokinesis. The left ventricular internal cavity size  was severely dilated. Indeterminate  diastolic filling due to E-A fusion.   2. Right ventricular systolic function is moderately reduced. The right  ventricular size is normal. There is normal pulmonary artery systolic  pressure.   3. The mitral valve is abnormal. Severe mitral valve regurgitation. No  evidence of mitral stenosis.   4. The aortic valve was not well visualized. Aortic valve regurgitation  is not visualized. No aortic stenosis is present.   5. The inferior vena cava is normal in size with greater than 50%  respiratory variability, suggesting right atrial pressure of 3 mmHg.   FINDINGS   Left Ventricle: Left ventricular ejection fraction, by estimation, is 20  to 25%. The left ventricle has severely decreased function. The left  ventricle demonstrates global hypokinesis. Definity contrast agent was  given IV to delineate the left  ventricular endocardial borders. The left ventricular internal cavity size  was severely dilated. There is no left ventricular hypertrophy.  Indeterminate diastolic filling due to E-A fusion.   Right Ventricle: The right ventricular size is normal. No increase in  right ventricular wall thickness. Right ventricular systolic function is  moderately reduced. There is normal pulmonary artery systolic pressure.  The tricuspid regurgitant velocity is  2.32 m/s, and with an assumed right atrial pressure of 3 mmHg, the  estimated right ventricular systolic pressure is 23.5 mmHg.   Mitral Valve: The mitral valve is abnormal. Severe mitral valve  regurgitation. No evidence of mitral valve stenosis. MV peak gradient, 7.1  mmHg. The mean mitral valve gradient is 3.0 mmHg.   Tricuspid Valve: The tricuspid valve is not well visualized. Tricuspid  valve regurgitation is mild.   Aortic Valve: The  aortic valve was not well visualized. Aortic valve  regurgitation is not visualized. No aortic stenosis is present. Aortic  valve mean gradient measures 3.0 mmHg. Aortic valve peak gradient measures  5.3 mmHg. Aortic valve area, by VTI  measures 1.45 cm.   Pulmonic Valve: The pulmonic valve was not well visualized.   Pulmonary Artery: The pulmonary artery is of normal size.   Venous: The inferior vena cava is normal in size with greater than 50%  respiratory variability, suggesting right atrial pressure of 3 mmHg.   Additional Comments: A device lead is visualized in the right ventricle.  LEFT VENTRICLE  PLAX 2D  LVIDd:         7.95 cm      Diastology  LVIDs:         7.50 cm      LV e' medial:    5.92 cm/s  LV PW:         1.00 cm      LV E/e' medial:  20.1  LV IVS:        0.75 cm      LV e' lateral:   4.62 cm/s  LVOT diam:     2.20 cm      LV E/e' lateral: 25.8  LV SV:  28  LV SV Index:   14  LVOT Area:     3.80 cm     LV Volumes (MOD)  LV vol d, MOD A2C: 314.0 ml  LV vol d, MOD A4C: 277.0 ml  LV vol s, MOD A2C: 241.0 ml  LV vol s, MOD A4C: 255.0 ml  LV SV MOD A2C:     73.0 ml  LV SV MOD A4C:     277.0 ml  LV SV MOD BP:      68.6 ml   LEFT ATRIUM         Index  LA diam:    5.50 cm 2.66 cm/m   AORTIC VALVE  AV Area (Vmax):    1.60 cm  AV Area (Vmean):   1.65 cm  AV Area (VTI):     1.45 cm  AV Vmax:           115.00 cm/s  AV Vmean:          77.600 cm/s  AV VTI:            0.194 m  AV Peak Grad:      5.3 mmHg  AV Mean Grad:      3.0 mmHg  LVOT Vmax:         48.30 cm/s  LVOT Vmean:        33.700 cm/s  LVOT VTI:          0.074 m  LVOT/AV VTI ratio: 0.38   MITRAL VALVE                  TRICUSPID VALVE  MV Area (PHT): 9.37 cm       TR Peak grad:   21.5 mmHg  MV Area VTI:   1.19 cm       TR Vmax:        232.00 cm/s  MV Peak grad:  7.1 mmHg  MV Mean grad:  3.0 mmHg       SHUNTS  MV Vmax:       1.33 m/s       Systemic VTI:  0.07 m  MV Vmean:      79.4 cm/s       Systemic Diam: 2.20 cm  MV Decel Time: 81 msec  MR Peak grad:    75.0 mmHg  MR Mean grad:    42.0 mmHg  MR Vmax:         433.00 cm/s  MR Vmean:        299.0 cm/s  MR PISA:         5.09 cm  MR PISA Eff ROA: 59 mm  MR PISA Radius:  0.90 cm  MV E velocity: 119.00 cm/s  MV A velocity: 59.10 cm/s  MV E/A ratio:  2.01   Antimicrobials: Anti-infectives (From admission, onward)    None        Subjective: Seen and examined at bedside and thinks she is feeling better.  Denies any chest pain and shortness of breath is improving.  States that she is still urinating quite frequently.  Happy that her renal function did not get worse.  No nausea or vomiting.  Currently getting an echocardiogram and no acute distress.  States that she is able to lay flatter.  No other concerns or complaints at this time.  Objective: Vitals:   07/21/21 0042 07/21/21 0415 07/21/21 0633 07/21/21 0807  BP: (!) 82/53 (!) 81/59  100/66  Pulse: 79 81  77  Resp:  18 18  18   Temp: 97.7 F (36.5 C) 98 F (36.7 C)  98.1 F (36.7 C)  TempSrc: Oral Oral  Oral  SpO2: 100% 100%  100%  Weight:   106.1 kg   Height:        Intake/Output Summary (Last 24 hours) at 07/21/2021 1226 Last data filed at 07/21/2021 1100 Gross per 24 hour  Intake 840 ml  Output 1000 ml  Net -160 ml    Filed Weights   07/18/21 2148 07/20/21 0500 07/21/21 4196  Weight: 107.8 kg 106.4 kg 106.1 kg   Examination: Physical Exam:  Constitutional: WN/WD morbidly obese AAF in NAD appears calm Eyes: Lids and conjunctivae normal, sclerae anicteric  ENMT: External Ears, Nose appear normal. Grossly normal hearing. Mucous membranes are moist.   Neck: Appears normal, supple, no cervical masses, normal ROM, no appreciable thyromegaly; no JVD Respiratory: Diminished to auscultation bilaterally, no wheezing, rales, rhonchi or crackles. Normal respiratory effort and patient is not tachypenic. No accessory muscle use. Unlabored breathing   Cardiovascular: RRR, no murmurs / rubs / gallops. S1 and S2 auscultated. Mild LE Edema  Abdomen: Soft, non-tender, Distended 2/2 body habitus. Bowel sounds positive.  GU: Deferred. Musculoskeletal: No clubbing / cyanosis of digits/nails. No joint deformity upper and lower extremities.  Skin: No rashes, lesions, ulcers on limited skin evaluation. No induration; Warm and dry.  Neurologic: CN 2-12 grossly intact with no focal deficits.  Romberg sign and cerebellar reflexes not assessed. Psychiatric: Normal judgment and insight. Alert and oriented x 3. Normal mood and appropriate affect.   Data Reviewed: I have personally reviewed following labs and imaging studies  CBC: Recent Labs  Lab 07/18/21 1647 07/19/21 0717 07/20/21 0427 07/21/21 0539  WBC 14.3* 9.9 10.5 7.9  NEUTROABS  --   --  7.5 5.0  HGB 10.1* 9.5* 10.5* 10.6*  HCT 33.4* 31.4* 34.9* 34.8*  MCV 76.4* 75.1* 75.5* 73.6*  PLT 315 279 312 222    Basic Metabolic Panel: Recent Labs  Lab 07/16/21 0848 07/18/21 1647 07/19/21 0717 07/20/21 0427 07/21/21 0539  NA 136 133* 135 137 137  K 3.9 3.8 3.4* 4.4 4.1  CL 107 103 104 105 106  CO2 21* 21* 20* 24 23  GLUCOSE 96 101* 81 90 88  BUN 11 13 16 17 17   CREATININE 1.34* 1.43* 1.19* 1.40* 1.39*  CALCIUM 8.9 9.2 8.7* 9.0 9.1  MG  --   --   --  2.1 2.0  PHOS  --   --   --  3.5 4.0    GFR: Estimated Creatinine Clearance: 56.5 mL/min (A) (by C-G formula based on SCr of 1.39 mg/dL (H)). Liver Function Tests: Recent Labs  Lab 07/20/21 0427 07/21/21 0539  AST 49* 29  ALT 52* 44  ALKPHOS 79 79  BILITOT 0.6 0.3  PROT 6.9 6.9  ALBUMIN 3.1* 3.1*    No results for input(s): LIPASE, AMYLASE in the last 168 hours. No results for input(s): AMMONIA in the last 168 hours. Coagulation Profile: No results for input(s): INR, PROTIME in the last 168 hours. Cardiac Enzymes: No results for input(s): CKTOTAL, CKMB, CKMBINDEX, TROPONINI in the last 168 hours. BNP (last 3  results) Recent Labs    07/23/20 1000  PROBNP 312*    HbA1C: No results for input(s): HGBA1C in the last 72 hours. CBG: No results for input(s): GLUCAP in the last 168 hours. Lipid Profile: No results for input(s): CHOL, HDL, LDLCALC, TRIG, CHOLHDL, LDLDIRECT in the last 72  hours. Thyroid Function Tests: Recent Labs    07/18/21 1647  TSH 2.512    Anemia Panel: No results for input(s): VITAMINB12, FOLATE, FERRITIN, TIBC, IRON, RETICCTPCT in the last 72 hours. Sepsis Labs: No results for input(s): PROCALCITON, LATICACIDVEN in the last 168 hours.  Recent Results (from the past 240 hour(s))  Resp Panel by RT-PCR (Flu A&B, Covid) Nasopharyngeal Swab     Status: None   Collection Time: 07/18/21  6:53 PM   Specimen: Nasopharyngeal Swab; Nasopharyngeal(NP) swabs in vial transport medium  Result Value Ref Range Status   SARS Coronavirus 2 by RT PCR NEGATIVE NEGATIVE Final    Comment: (NOTE) SARS-CoV-2 target nucleic acids are NOT DETECTED.  The SARS-CoV-2 RNA is generally detectable in upper respiratory specimens during the acute phase of infection. The lowest concentration of SARS-CoV-2 viral copies this assay can detect is 138 copies/mL. A negative result does not preclude SARS-Cov-2 infection and should not be used as the sole basis for treatment or other patient management decisions. A negative result may occur with  improper specimen collection/handling, submission of specimen other than nasopharyngeal swab, presence of viral mutation(s) within the areas targeted by this assay, and inadequate number of viral copies(<138 copies/mL). A negative result must be combined with clinical observations, patient history, and epidemiological information. The expected result is Negative.  Fact Sheet for Patients:  EntrepreneurPulse.com.au  Fact Sheet for Healthcare Providers:  IncredibleEmployment.be  This test is no t yet approved or cleared by  the [place] FDA and  has been authorized for detection and/or diagnosis of SARS-CoV-2 by FDA under an Emergency Use Authorization (EUA). This EUA will remain  in effect (meaning this test can be used) for the duration of the COVID-19 declaration under Section 564(b)(1) of the Act, 21 U.S.C.section 360bbb-3(b)(1), unless the authorization is terminated  or revoked sooner.       Influenza A by PCR NEGATIVE NEGATIVE Final   Influenza B by PCR NEGATIVE NEGATIVE Final    Comment: (NOTE) The Xpert Xpress SARS-CoV-2/FLU/RSV plus assay is intended as an aid in the diagnosis of influenza from Nasopharyngeal swab specimens and should not be used as a sole basis for treatment. Nasal washings and aspirates are unacceptable for Xpert Xpress SARS-CoV-2/FLU/RSV testing.  Fact Sheet for Patients: EntrepreneurPulse.com.au  Fact Sheet for Healthcare Providers: IncredibleEmployment.be  This test is not yet approved or cleared by the [place] FDA and has been authorized for detection and/or diagnosis of SARS-CoV-2 by FDA under an Emergency Use Authorization (EUA). This EUA will remain in effect (meaning this test can be used) for the duration of the COVID-19 declaration under Section 564(b)(1) of the Act, 21 U.S.C. section 360bbb-3(b)(1), unless the authorization is terminated or revoked.  Performed at [facility], [address] [place], [place] [address]      RN Pressure Injury Documentation:     Estimated body mass index is 41.45 kg/m as calculated from the following:   Height as of this encounter: 5\' 3"  (1.6 m).   Weight as of this encounter: 106.1 kg.  Malnutrition Type:   Malnutrition Characteristics:   Nutrition Interventions:    Radiology Studies: DG Chest Port 1 View  Result Date: 07/20/2021 CLINICAL DATA:  Shortness of breath EXAM: PORTABLE CHEST 1 VIEW COMPARISON:  07/18/2021 FINDINGS: Cardiac shadow remains  enlarged. Defibrillator is again noted and stable. Lungs are clear bilaterally. No bony abnormality is seen. IMPRESSION: No acute abnormality noted. Electronically Signed   By: Linus Mako.D.  On: 07/20/2021 00:26   ECHOCARDIOGRAM LIMITED  Result Date: 07/21/2021    ECHOCARDIOGRAM LIMITED REPORT   Patient Name:   [name] Date of Exam: 07/21/2021 Medical Rec #:  [id number]    Height:       63.0 in Accession #:    [id number]   Weight:       234.0 lb Date of Birth:  [birth year]    BSA:          2.067 m Patient Age:    35 years     BP:           83/59 mmHg Patient Gender: F            HR:           81 bpm. Exam Location:  [facility] Procedure: Limited Echo, Color Doppler, Cardiac Doppler and Intracardiac            Opacification Agent Indications:     Systolic CHF  History:         Patient has prior history of Echocardiogram examinations.                  Cardiomyopathy, CAD, Defibrillator; Risk Factors:Hypertension                  and Morbid Obesity.  Sonographer:     L Thornton-Maynard Referring Phys:  [place] Diagnosing Phys: Nelva Bush MD IMPRESSIONS  1. Left ventricular ejection fraction, by estimation, is 20 to 25%. The left ventricle has severely decreased function. The left ventricle demonstrates global hypokinesis. The left ventricular internal cavity size was severely dilated. Indeterminate diastolic filling due to E-A fusion.  2. Right ventricular systolic function is moderately reduced. The right ventricular size is normal. There is normal pulmonary artery systolic pressure.  3. The mitral valve is abnormal. Severe mitral valve regurgitation. No evidence of mitral stenosis.  4. The aortic valve was not well visualized. Aortic valve regurgitation is not visualized. No aortic stenosis is present.  5. The inferior vena cava is normal in size with greater than 50% respiratory variability, suggesting right atrial pressure of 3 mmHg. FINDINGS  Left Ventricle: Left ventricular ejection  fraction, by estimation, is 20 to 25%. The left ventricle has severely decreased function. The left ventricle demonstrates global hypokinesis. Definity contrast agent was given IV to delineate the left ventricular endocardial borders. The left ventricular internal cavity size was severely dilated. There is no left ventricular hypertrophy. Indeterminate diastolic filling due to E-A fusion. Right Ventricle: The right ventricular size is normal. No increase in right ventricular wall thickness. Right ventricular systolic function is moderately reduced. There is normal pulmonary artery systolic pressure. The tricuspid regurgitant velocity is 2.32 m/s, and with an assumed right atrial pressure of 3 mmHg, the estimated right ventricular systolic pressure is 93.8 mmHg. Mitral Valve: The mitral valve is abnormal. Severe mitral valve regurgitation. No evidence of mitral valve stenosis. MV peak gradient, 7.1 mmHg. The mean mitral valve gradient is 3.0 mmHg. Tricuspid Valve: The tricuspid valve is not well visualized. Tricuspid valve regurgitation is mild. Aortic Valve: The aortic valve was not well visualized. Aortic valve regurgitation is not visualized. No aortic stenosis is present. Aortic valve mean gradient measures 3.0 mmHg. Aortic valve peak gradient measures 5.3 mmHg. Aortic valve area, by VTI measures 1.45 cm. Pulmonic Valve: The pulmonic valve was not well visualized. Pulmonary Artery: The pulmonary artery is of normal size. Venous: The inferior vena cava is normal in  size with greater than 50% respiratory variability, suggesting right atrial pressure of 3 mmHg. Additional Comments: A device lead is visualized in the right ventricle. LEFT VENTRICLE PLAX 2D LVIDd:         7.95 cm      Diastology LVIDs:         7.50 cm      LV e' medial:    5.92 cm/s LV PW:         1.00 cm      LV E/e' medial:  20.1 LV IVS:        0.75 cm      LV e' lateral:   4.62 cm/s LVOT diam:     2.20 cm      LV E/e' lateral: 25.8 LV SV:          28 LV SV Index:   14 LVOT Area:     3.80 cm  LV Volumes (MOD) LV vol d, MOD A2C: 314.0 ml LV vol d, MOD A4C: 277.0 ml LV vol s, MOD A2C: 241.0 ml LV vol s, MOD A4C: 255.0 ml LV SV MOD A2C:     73.0 ml LV SV MOD A4C:     277.0 ml LV SV MOD BP:      68.6 ml LEFT ATRIUM         Index LA diam:    5.50 cm 2.66 cm/m  AORTIC VALVE AV Area (Vmax):    1.60 cm AV Area (Vmean):   1.65 cm AV Area (VTI):     1.45 cm AV Vmax:           115.00 cm/s AV Vmean:          77.600 cm/s AV VTI:            0.194 m AV Peak Grad:      5.3 mmHg AV Mean Grad:      3.0 mmHg LVOT Vmax:         48.30 cm/s LVOT Vmean:        33.700 cm/s LVOT VTI:          0.074 m LVOT/AV VTI ratio: 0.38 MITRAL VALVE                  TRICUSPID VALVE MV Area (PHT): 9.37 cm       TR Peak grad:   21.5 mmHg MV Area VTI:   1.19 cm       TR Vmax:        232.00 cm/s MV Peak grad:  7.1 mmHg MV Mean grad:  3.0 mmHg       SHUNTS MV Vmax:       1.33 m/s       Systemic VTI:  0.07 m MV Vmean:      79.4 cm/s      Systemic Diam: 2.20 cm MV Decel Time: 81 msec MR Peak grad:    75.0 mmHg MR Mean grad:    42.0 mmHg MR Vmax:         433.00 cm/s MR Vmean:        299.0 cm/s MR PISA:         5.09 cm MR PISA Eff ROA: 59 mm MR PISA Radius:  0.90 cm MV E velocity: 119.00 cm/s MV A velocity: 59.10 cm/s MV E/A ratio:  2.01 Harrell Gave End MD Electronically signed by Nelva Bush MD Signature Date/Time: 07/21/2021/12:12:49 PM    Final     Scheduled Meds:  amiodarone  200 mg Oral Daily   aspirin EC  81 mg Oral Daily   carvedilol  3.125 mg Oral BID WC   dapagliflozin propanediol  10 mg Oral Daily   digoxin  0.125 mg Oral Daily   enoxaparin (LOVENOX) injection  0.5 mg/kg Subcutaneous Q24H   furosemide  40 mg Intravenous BID   levothyroxine  25 mcg Oral QAC breakfast   potassium chloride SA  40 mEq Oral BID   quiniDINE gluconate  324 mg Oral BID   rosuvastatin  40 mg Oral Daily   sacubitril-valsartan  1 tablet Oral BID   spironolactone  12.5 mg Oral QHS   ticagrelor  90  mg Oral BID   Continuous Infusions:   LOS: 3 days   Kerney Elbe, DO [facility] PAGER is on AMION  If 7PM-7AM, please contact night-coverage [email or web address]

## 2021-07-22 DIAGNOSIS — D649 Anemia, unspecified: Secondary | ICD-10-CM

## 2021-07-22 DIAGNOSIS — E871 Hypo-osmolality and hyponatremia: Secondary | ICD-10-CM

## 2021-07-22 DIAGNOSIS — I5041 Acute combined systolic (congestive) and diastolic (congestive) heart failure: Secondary | ICD-10-CM | POA: Diagnosis not present

## 2021-07-22 LAB — IRON AND TIBC
Iron: 30 ug/dL (ref 28–170)
Saturation Ratios: 9 % — ABNORMAL LOW (ref 10.4–31.8)
TIBC: 346 ug/dL (ref 250–450)
UIBC: 316 ug/dL

## 2021-07-22 LAB — COMPREHENSIVE METABOLIC PANEL
ALT: 34 U/L (ref 0–44)
AST: 22 U/L (ref 15–41)
Albumin: 3.2 g/dL — ABNORMAL LOW (ref 3.5–5.0)
Alkaline Phosphatase: 71 U/L (ref 38–126)
Anion gap: 6 (ref 5–15)
BUN: 18 mg/dL (ref 6–20)
CO2: 22 mmol/L (ref 22–32)
Calcium: 8.5 mg/dL — ABNORMAL LOW (ref 8.9–10.3)
Chloride: 103 mmol/L (ref 98–111)
Creatinine, Ser: 1.31 mg/dL — ABNORMAL HIGH (ref 0.44–1.00)
GFR, Estimated: 50 mL/min — ABNORMAL LOW (ref >60)
Glucose, Bld: 89 mg/dL (ref 70–99)
Potassium: 4.2 mmol/L (ref 3.5–5.1)
Sodium: 131 mmol/L — ABNORMAL LOW (ref 135–145)
Total Bilirubin: 0.5 mg/dL (ref 0.3–1.2)
Total Protein: 6.9 g/dL (ref 6.5–8.1)

## 2021-07-22 LAB — CBC WITH DIFFERENTIAL/PLATELET
Abs Immature Granulocytes: 0.04 10*3/uL (ref 0.00–0.07)
Basophils Absolute: 0 10*3/uL (ref 0.0–0.1)
Basophils Relative: 0 %
Eosinophils Absolute: 0 10*3/uL (ref 0.0–0.5)
Eosinophils Relative: 0 %
HCT: 35 % — ABNORMAL LOW (ref 36.0–46.0)
Hemoglobin: 10.6 g/dL — ABNORMAL LOW (ref 12.0–15.0)
Immature Granulocytes: 0 %
Lymphocytes Relative: 25 %
Lymphs Abs: 2.2 10*3/uL (ref 0.7–4.0)
MCH: 22.9 pg — ABNORMAL LOW (ref 26.0–34.0)
MCHC: 30.3 g/dL (ref 30.0–36.0)
MCV: 75.6 fL — ABNORMAL LOW (ref 80.0–100.0)
Monocytes Absolute: 0.8 10*3/uL (ref 0.1–1.0)
Monocytes Relative: 9 %
Neutro Abs: 5.8 10*3/uL (ref 1.7–7.7)
Neutrophils Relative %: 66 %
Platelets: 354 10*3/uL (ref 150–400)
RBC: 4.63 MIL/uL (ref 3.87–5.11)
RDW: 16.7 % — ABNORMAL HIGH (ref 11.5–15.5)
WBC: 8.9 10*3/uL (ref 4.0–10.5)
nRBC: 0 % (ref 0.0–0.2)

## 2021-07-22 LAB — RETICULOCYTES
Immature Retic Fract: 26 % — ABNORMAL HIGH (ref 2.3–15.9)
RBC.: 4.69 MIL/uL (ref 3.87–5.11)
Retic Count, Absolute: 97.6 10*3/uL (ref 19.0–186.0)
Retic Ct Pct: 2.1 % (ref 0.4–3.1)

## 2021-07-22 LAB — PHOSPHORUS: Phosphorus: 4.1 mg/dL (ref 2.5–4.6)

## 2021-07-22 LAB — VITAMIN B12: Vitamin B-12: 390 pg/mL (ref 180–914)

## 2021-07-22 LAB — FERRITIN: Ferritin: 15 ng/mL (ref 11–307)

## 2021-07-22 LAB — FOLATE: Folate: 7.8 ng/mL (ref >5.9)

## 2021-07-22 LAB — MAGNESIUM: Magnesium: 2 mg/dL (ref 1.7–2.4)

## 2021-07-22 MED ORDER — CARVEDILOL 3.125 MG PO TABS: 3.1250 mg | ORAL_TABLET | Freq: Two times a day (BID) | ORAL | 0 refills | Status: DC

## 2021-07-22 NOTE — Progress Notes (Signed)
PIV removed. Discharge instructions completed. Patient verbalized understanding of medication regimen, follow up appointments and discharge instructions. Patient belongings gathered and packed to discharge. Waiting for husband to get off work to pick up patient.

## 2021-07-22 NOTE — Progress Notes (Signed)
Progress Note  Patient Name: [name] Date of Encounter: 07/22/2021  Primary Cardiologist: [name]  Subjective   No chest pain or palpitations. Dyspnea improved, feels close to her baseline. Was able to ambulate to her restroom today without symptoms, this is improved when compared to yesterday. Documented UOP 2.3 L for the past 24 hours, net - 5.6 L for the admission. Weight unchanged. Renal function largely stable.   Inpatient Medications    Scheduled Meds:  amiodarone  200 mg Oral Daily   aspirin EC  81 mg Oral Daily   carvedilol  3.125 mg Oral BID WC   dapagliflozin propanediol  10 mg Oral Daily   digoxin  0.125 mg Oral Daily   enoxaparin (LOVENOX) injection  0.5 mg/kg Subcutaneous Q24H   furosemide  40 mg Intravenous BID   levothyroxine  25 mcg Oral QAC breakfast   potassium chloride SA  40 mEq Oral BID   quiniDINE gluconate  324 mg Oral BID   rosuvastatin  40 mg Oral Daily   sacubitril-valsartan  1 tablet Oral BID   spironolactone  12.5 mg Oral QHS   ticagrelor  90 mg Oral BID   Continuous Infusions:  PRN Meds: acetaminophen **OR** acetaminophen, guaiFENesin-dextromethorphan, ipratropium-albuterol, magnesium hydroxide, ondansetron **OR** ondansetron (ZOFRAN) IV, traZODone   Vital Signs    Vitals:   07/21/21 1913 07/21/21 2000 07/22/21 0429 07/22/21 0914  BP: 91/60   93/68  Pulse: 85   77  Resp: 18   18  Temp: 97.7 F (36.5 C)   97.8 F (36.6 C)  TempSrc:    Oral  SpO2: 100%   100%  Weight:  106.1 kg 106.1 kg   Height:  5\' 3"  (1.6 m)      Intake/Output Summary (Last 24 hours) at 07/22/2021 0938 Last data filed at 07/22/2021 0610 Gross per 24 hour  Intake 480 ml  Output 2800 ml  Net -2320 ml   Filed Weights   07/21/21 0633 07/21/21 2000 07/22/21 0429  Weight: 106.1 kg 106.1 kg 106.1 kg    Telemetry    SR with PVCs - Personally Reviewed  ECG    No new tracings - Personally Reviewed  Physical Exam   GEN: No acute distress.   Neck: JVD  difficult to assess secondary to body habitus. Cardiac: RRR, II/VI systolic murmur at the LSB, no rubs, or gallops.  Respiratory: Coarse breath sounds bilaterally.  GI: Soft, nontender, non-distended.   MS: No edema; No deformity. Neuro:  Alert and oriented x 3; Nonfocal.  Psych: Normal affect.  Labs    Chemistry Recent Labs  Lab 07/20/21 0427 07/21/21 0539 07/22/21 0601  NA 137 137 131*  K 4.4 4.1 4.2  CL 105 106 103  CO2 24 23 22   GLUCOSE 90 88 89  BUN 17 17 18   CREATININE 1.40* 1.39* 1.31*  CALCIUM 9.0 9.1 8.5*  PROT 6.9 6.9 6.9  ALBUMIN 3.1* 3.1* 3.2*  AST 49* 29 22  ALT 52* 44 34  ALKPHOS 79 79 71  BILITOT 0.6 0.3 0.5  GFRNONAA 46* 46* 50*  ANIONGAP 8 8 6      Hematology Recent Labs  Lab 07/20/21 0427 07/21/21 0539 07/22/21 0601  WBC 10.5 7.9 8.9  RBC 4.62 4.73 4.63   4.69  HGB 10.5* 10.6* 10.6*  HCT 34.9* 34.8* 35.0*  MCV 75.5* 73.6* 75.6*  MCH 22.7* 22.4* 22.9*  MCHC 30.1 30.5 30.3  RDW 16.7* 16.9* 16.7*  PLT 312 349 354  Cardiac EnzymesNo results for input(s): TROPONINI in the last 168 hours. No results for input(s): TROPIPOC in the last 168 hours.   BNP Recent Labs  Lab 07/18/21 1647  BNP 1,331.6*     DDimer No results for input(s): DDIMER in the last 168 hours.   Radiology    No new studies  Cardiac Studies   2D echo 07/21/2021: 1. Left ventricular ejection fraction, by estimation, is 20 to 25%. The  left ventricle has severely decreased function. The left ventricle  demonstrates global hypokinesis. The left ventricular internal cavity size  was severely dilated. Indeterminate  diastolic filling due to E-A fusion.   2. Right ventricular systolic function is moderately reduced. The right  ventricular size is normal. There is normal pulmonary artery systolic  pressure.   3. The mitral valve is abnormal. Severe mitral valve regurgitation. No  evidence of mitral stenosis.   4. The aortic valve was not well visualized. Aortic valve  regurgitation  is not visualized. No aortic stenosis is present.   5. The inferior vena cava is normal in size with greater than 50%  respiratory variability, suggesting right atrial pressure of 3 mmHg.  Patient Profile     51 y.o. 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  Assessment & Plan    1. Acute on chronic HFrEF secondary to NICM s/p ICD: -Volume status is improved -Echo with unchanged cardiomyopathy  -Renal function stable, continue IV Lasix -Coreg, digoxin, Farxiga, Entresto, spironolactone -Ambulate in the hall with assistance -May be able to be discharged later today or tomorrow, will discuss with MD -CHF education   2. History of VT/ VF: -Device interrogated this admission  -No changes per EP -Remains on amiodarone and quinidine  -Coreg  3. History of CVA s/p thrombectomy and stenting: -DAPT -Crestor       For questions or updates, please contact [place] Please consult [email or web address] for contact info under Cardiology/STEMI.    Signed, Christell Faith, PA-C [facility] Pager: [phone] 07/22/2021, 9:38 AM

## 2021-07-22 NOTE — Progress Notes (Signed)
SATURATION QUALIFICATIONS: (This note is used to comply with regulatory documentation for home oxygen)  Patient Saturations on Room Air at Rest = 95%  Patient Saturations on Room Air while Ambulating = 92%   Please briefly explain why patient needs home oxygen: NA

## 2021-07-22 NOTE — Progress Notes (Signed)
PROGRESS NOTE    [name]  RCV:[id number] DOB: 12-14-1970 DOA: 07/18/2021 PCP: Patient, No Pcp Per (Inactive)   Brief Narrative:  The patient is a 51 year old African-American female with a past medical history significant for [place] to hypothyroidism, CAD, history of nonischemic cardiomyopathy status post AICD placement, history of peptic ulcer disease, chronic systolic and diastolic CHF as well as other comorbidities who presented to the ED with acute onset of dyspnea as well as orthopnea and bilateral lower extremity swelling with dyspnea on exertion and abdominal distention and swelling has been worsening over last 3 weeks and has significantly gotten worse over several days.  Over the last week her diuretic therapy had been increased torsemide 4 tablets 2 at night.  She had been having worsening cough productive for clear sputum the last few days.  She felt that she is still retaining fluids and has been compliant with her medications.  She denied chest pain or palpitations.  No nausea vomiting or abdominal pain but she did describe abdominal distention.  She was seen in urgent care yesterday and was advised to come to the ED.  Upon arrival to the ED she had an elevated heart rate and respiratory rate.  She had mild hyponatremia and slightly elevated creatinine compared to her prior value on 07/16/2021.  EKG showed sinus tachycardia and chest x-ray showed cardiomegaly with vascular congestion.  She is given IV Lasix 60 mg and admitted to cardiac telemetry for further evaluation management and cardiology was consulted for assistance.  Her diuresis was changed to IV Bumex 1 mg p.o. twice daily and then subsequently changed to 80 mg of Lasix twice daily but now she will be decreased to IV 40 twice daily given her rising creatinine.  Since yesterday her BUN and creatinine has been relatively stable and essentially the same and now slightly improved. Cardiology recommending continue IV furosemide 40 mg  twice daily.  She will need an ambulatory home O2 screen prior to discharge  Assessment & Plan:   Principal Problem:   Acute CHF (congestive heart failure) (HCC)  Acute on chronic combined systolic and diastolic CHF with history of nonischemic cardiomyopathy status post AICD placement -He was admitted to cardiac telemetry bed -She was initiated on IV diuresis with IV Lasix 60 mg and was changed to IV Bumex 1 mg twice daily and then subsequently changed to IV Lasix 80 mg po BID and further reduced to IV 40 mg twice daily will continue for today -Troponins never checked for some reason so we will check now -BNP was 1331.6 -Had a 2D echo in May of 2022 with a EF of 25% and grade 2 diastolic dysfunction with a moderately dilated left atrium and mild to moderate mitral regurg -Cardiology is being consulted for further evaluation and is aware -Strict I's and O's and daily weights -Torsemide was recently increased to 80 mg twice daily as outpatient and ICM monitoring on 07/15/2020 showed normal fluid levels -Presented with increased shortness of breath and elevated BNP with increased vascular congestion on chest x-ray -Her home meds include torsemide 80 mg p.o. twice daily, Entresto 24-26 mg p.o. twice daily, carvedilol 6.25 mg p.o. twice daily, small Liken 25 mg daily, digoxin 0.625 mg p.o. daily and Farxiga 10 mg Daily-she is given IV Lasix 60 mg and then transition to IV Bumex 1 mg twice daily given that she was concerned with the poor response to Lasix therapy but then changed back to IV Lasix 80 mg twice daily; because  our slight bump in creatinine cardiology is now decreased the diuresis to 40 mg IV twice daily and regular.  Her echocardiogram is being repeated  -Repeat Limited ECHOCardiogram 07/21/21 showed "Left ventricular ejection fraction, by estimation, is 20 to 25%. The left ventricle has severely decreased function. The left ventricle demonstrates global  hypokinesis. The left ventricular  internal cavity size was severely dilated. Indeterminate diastolic filling due to E-A fusion. Right ventricular systolic function is moderately reduced. The right ventricular size is normal. There is normal pulmonary artery systolic  pressure. The mitral valve is abnormal. Severe mitral valve regurgitation. No evidence of mitral stenosis. The aortic valve was not well visualized. Aortic valve regurgitation is not visualized. No aortic stenosis is present. The inferior vena cava is normal in size with greater than 50% respiratory variability, suggesting right atrial pressure of 3 mmHg."  -Currently we will continue her amiodarone, her Bumex, carvedilol, digoxin, Entresto, and dapagliflozin as well as spironolactone 25 mg p.o. nightly; cardiology is going to decrease her carvedilol from 6.25 mg to 3.125 twice daily and spironolactone from 25 mg nightly to 12.5 nightly and will hold her evening Entresto -We will defer diuresis to cardiology and continue monitoring her renal function carefully -Continue monitor for signs and symptoms of volume overload and repeat chest x-ray 07/20/21 shows no acute cardiopulmonary abnormality noted  Hypothyroidism -Checked TSH and was 2.512 -Continue with levothyroxine 25 mcg p.o. daily  Dyslipidemia/hyperlipidemia -Continue statin therapy with Rosuvastatin 40 mg.  History of nonsustained ventricular tachycardia PVCs -Continue on her home amiodarone 200 mg p.o. daily and quinidine gluconate 324 mg p.o. twice daily -Continue to monitor on telemetry  Leukocytosis, improved -Likely reactive in the setting of above and is improved -Patient's WBC went from 14.8 -> 9.9 -> 10.5 -> 7.9 -> 8.9 -Continue to monitor for signs and symptoms infection; no  Hypertension -We will continue home digoxin, and medication adjustments will be made by cardiology and Coreg has been reduced and spironolactone has been reduced and her Delene Loll will be held -Continue monitor blood pressures  per protocol -Last blood pressure reading was 111/95  History of CVA status post thrombectomy and stent placement -Continuing Aspirin 81 mg po Daily and Ticagrelor 90 mg p.o. twice daily as well as rosuvastatin 40 mg p.o. daily  AKI on CKD Stage 3a -Patient's BUNs/creatinine went from 11/1.34 -> 13/1.43 -> 16/1.19 and is now slightly bumped and gone to 17/1.40 -> 17/1.39 -> 18/1.31 -Continue with diuresis as above per cardiology recommendations -Avoid further nephrotoxic medications, contrast dyes, hypotension and renally dose medications -Repeat CMP in a.m.  Hyponatremia -Patient's sodium went from 137 is now 131 -Likely in the setting of diuresis -Continue to monitor and trend and repeat CMP in a.m.  Abnormal LFTs, improved -AST is mildly elevated at 49 and ALT is 52; now AST is now 22  and ALT is now 34 -In the setting of vascular congestion and volume overload -Continue monitor and trend and repeat CMP in a.m. and if necessary will obtain a right upper quadrant ultrasound as well as an acute hepatitis panel  Microcytic Anemia  -Patient's hemoglobin/hematocrit went from 10.1/33.4 -> 9.5/31.4 -> 10.5/34.9 -> 10.6/34.8 -> 10.6/35.0 -Checked Anemia Panel and showed an iron level of 30, U IBC of 316, TIBC of 346, saturation ratio of 9%, ferritin level of 15, folate level 7.8 -Monitor for signs and symptoms bleeding; currently no overt bleeding noted -Repeat CBC in a.m.  Hypokalemia -Patient's K+ has gone from 3.9 -> 3.8 ->  3.4 -> 4.4 -> 4.1 -> 4.2 -Continue to Monitor and Repelte as Necssary -Repeat CMP in the AM   Morbid Obesity -Complicates overall prognosis and care -Estimated body mass index is 41.43 kg/m as calculated from the following:   Height as of this encounter: 5\' 3"  (1.6 m).   Weight as of this encounter: 106.1 kg.  -Weight Loss and Dietary Counseling given  DVT prophylaxis: Enoxaparin 52.5 mg Code Status: FULL CODE Family Communication: Discussed with husband  at bedside  Disposition Plan: Pending further clinical improvement and clearance by cardiology  Status is: Inpatient  Remains inpatient appropriate because: Continues remain volume overloaded and will need cardiac clearance prior to safe discharge disposition  Consultants:  Cardiology  Procedures:  ECHOCARDIOGRAM IMPRESSIONS     1. Left ventricular ejection fraction, by estimation, is 20 to 25%. The  left ventricle has severely decreased function. The left ventricle  demonstrates global hypokinesis. The left ventricular internal cavity size  was severely dilated. Indeterminate  diastolic filling due to E-A fusion.   2. Right ventricular systolic function is moderately reduced. The right  ventricular size is normal. There is normal pulmonary artery systolic  pressure.   3. The mitral valve is abnormal. Severe mitral valve regurgitation. No  evidence of mitral stenosis.   4. The aortic valve was not well visualized. Aortic valve regurgitation  is not visualized. No aortic stenosis is present.   5. The inferior vena cava is normal in size with greater than 50%  respiratory variability, suggesting right atrial pressure of 3 mmHg.   FINDINGS   Left Ventricle: Left ventricular ejection fraction, by estimation, is 20  to 25%. The left ventricle has severely decreased function. The left  ventricle demonstrates global hypokinesis. Definity contrast agent was  given IV to delineate the left  ventricular endocardial borders. The left ventricular internal cavity size  was severely dilated. There is no left ventricular hypertrophy.  Indeterminate diastolic filling due to E-A fusion.   Right Ventricle: The right ventricular size is normal. No increase in  right ventricular wall thickness. Right ventricular systolic function is  moderately reduced. There is normal pulmonary artery systolic pressure.  The tricuspid regurgitant velocity is  2.32 m/s, and with an assumed right atrial  pressure of 3 mmHg, the  estimated right ventricular systolic pressure is 41.7 mmHg.   Mitral Valve: The mitral valve is abnormal. Severe mitral valve  regurgitation. No evidence of mitral valve stenosis. MV peak gradient, 7.1  mmHg. The mean mitral valve gradient is 3.0 mmHg.   Tricuspid Valve: The tricuspid valve is not well visualized. Tricuspid  valve regurgitation is mild.   Aortic Valve: The aortic valve was not well visualized. Aortic valve  regurgitation is not visualized. No aortic stenosis is present. Aortic  valve mean gradient measures 3.0 mmHg. Aortic valve peak gradient measures  5.3 mmHg. Aortic valve area, by VTI  measures 1.45 cm.   Pulmonic Valve: The pulmonic valve was not well visualized.   Pulmonary Artery: The pulmonary artery is of normal size.   Venous: The inferior vena cava is normal in size with greater than 50%  respiratory variability, suggesting right atrial pressure of 3 mmHg.   Additional Comments: A device lead is visualized in the right ventricle.  LEFT VENTRICLE  PLAX 2D  LVIDd:         7.95 cm      Diastology  LVIDs:         7.50 cm  LV e' medial:    5.92 cm/s  LV PW:         1.00 cm      LV E/e' medial:  20.1  LV IVS:        0.75 cm      LV e' lateral:   4.62 cm/s  LVOT diam:     2.20 cm      LV E/e' lateral: 25.8  LV SV:         28  LV SV Index:   14  LVOT Area:     3.80 cm     LV Volumes (MOD)  LV vol d, MOD A2C: 314.0 ml  LV vol d, MOD A4C: 277.0 ml  LV vol s, MOD A2C: 241.0 ml  LV vol s, MOD A4C: 255.0 ml  LV SV MOD A2C:     73.0 ml  LV SV MOD A4C:     277.0 ml  LV SV MOD BP:      68.6 ml   LEFT ATRIUM         Index  LA diam:    5.50 cm 2.66 cm/m   AORTIC VALVE  AV Area (Vmax):    1.60 cm  AV Area (Vmean):   1.65 cm  AV Area (VTI):     1.45 cm  AV Vmax:           115.00 cm/s  AV Vmean:          77.600 cm/s  AV VTI:            0.194 m  AV Peak Grad:      5.3 mmHg  AV Mean Grad:      3.0 mmHg  LVOT Vmax:          48.30 cm/s  LVOT Vmean:        33.700 cm/s  LVOT VTI:          0.074 m  LVOT/AV VTI ratio: 0.38   MITRAL VALVE                  TRICUSPID VALVE  MV Area (PHT): 9.37 cm       TR Peak grad:   21.5 mmHg  MV Area VTI:   1.19 cm       TR Vmax:        232.00 cm/s  MV Peak grad:  7.1 mmHg  MV Mean grad:  3.0 mmHg       SHUNTS  MV Vmax:       1.33 m/s       Systemic VTI:  0.07 m  MV Vmean:      79.4 cm/s      Systemic Diam: 2.20 cm  MV Decel Time: 81 msec  MR Peak grad:    75.0 mmHg  MR Mean grad:    42.0 mmHg  MR Vmax:         433.00 cm/s  MR Vmean:        299.0 cm/s  MR PISA:         5.09 cm  MR PISA Eff ROA: 59 mm  MR PISA Radius:  0.90 cm  MV E velocity: 119.00 cm/s  MV A velocity: 59.10 cm/s  MV E/A ratio:  2.01   Antimicrobials: Anti-infectives (From admission, onward)    None        Subjective: Seen and examined at bedside and t thinks her breathing is doing better.  No nausea or vomiting.  States that incentive spirometer helps her break up her congestion.  No lightheadedness or dizziness.  No other concerns or complaints at this time.  Objective: Vitals:   07/21/21 2000 07/22/21 0429 07/22/21 0914 07/22/21 1149  BP:   93/68 (!) 111/95  Pulse:   77 68  Resp:   18 16  Temp:   97.8 F (36.6 C) 98.1 F (36.7 C)  TempSrc:   Oral Oral  SpO2:   100% 100%  Weight: 106.1 kg 106.1 kg    Height: 5\' 3"  (1.6 m)       Intake/Output Summary (Last 24 hours) at 07/22/2021 1210 Last data filed at 07/22/2021 1100 Gross per 24 hour  Intake 480 ml  Output 2300 ml  Net -1820 ml    Filed Weights   07/21/21 0633 07/21/21 2000 07/22/21 0429  Weight: 106.1 kg 106.1 kg 106.1 kg   Examination: Physical Exam:  Constitutional: WN/WD morbidly obese African-American female currently in no acute distress appears calm Eyes: Lids and conjunctivae normal, sclerae anicteric  ENMT: External Ears, Nose appear normal. Grossly normal hearing. Mucous membranes are moist.  Neck: Appears  normal, supple, no cervical masses, normal ROM, no appreciable thyromegaly; no appreciable JVD Respiratory: Diminished to auscultation bilaterally with coarse breath sounds but no real appreciable no wheezing, rales, rhonchi or crackles. Normal respiratory effort and patient is not tachypenic. No accessory muscle use.  Unlabored breathing and not wearing any supplemental oxygen nasal cannula Cardiovascular: RRR, no murmurs / rubs / gallops. S1 and S2 auscultated.  Slight lower extremity edema Abdomen: Soft, non-tender, distended secondary body habitus. Bowel sounds positive.  GU: Deferred. Musculoskeletal: No clubbing / cyanosis of digits/nails. No joint deformity upper and lower extremities. Skin: No rashes, lesions, ulcers on limited skin evaluation. No induration; Warm and dry.  Neurologic: CN 2-12 grossly intact with no focal deficits. Romberg sign and cerebellar reflexes not assessed.  Psychiatric: Normal judgment and insight. Alert and oriented x 3. Normal mood and appropriate affect.   Data Reviewed: I have personally reviewed following labs and imaging studies  CBC: Recent Labs  Lab 07/18/21 1647 07/19/21 0717 07/20/21 0427 07/21/21 0539 07/22/21 0601  WBC 14.3* 9.9 10.5 7.9 8.9  NEUTROABS  --   --  7.5 5.0 5.8  HGB 10.1* 9.5* 10.5* 10.6* 10.6*  HCT 33.4* 31.4* 34.9* 34.8* 35.0*  MCV 76.4* 75.1* 75.5* 73.6* 75.6*  PLT 315 279 312 349 016    Basic Metabolic Panel: Recent Labs  Lab 07/18/21 1647 07/19/21 0717 07/20/21 0427 07/21/21 0539 07/22/21 0601  NA 133* 135 137 137 131*  K 3.8 3.4* 4.4 4.1 4.2  CL 103 104 105 106 103  CO2 21* 20* 24 23 22   GLUCOSE 101* 81 90 88 89  BUN 13 16 17 17 18   CREATININE 1.43* 1.19* 1.40* 1.39* 1.31*  CALCIUM 9.2 8.7* 9.0 9.1 8.5*  MG  --   --  2.1 2.0 2.0  PHOS  --   --  3.5 4.0 4.1    GFR: Estimated Creatinine Clearance: 59.9 mL/min (A) (by C-G formula based on SCr of 1.31 mg/dL (H)). Liver Function Tests: Recent Labs  Lab  07/20/21 0427 07/21/21 0539 07/22/21 0601  AST 49* 29 22  ALT 52* 44 34  ALKPHOS 79 79 71  BILITOT 0.6 0.3 0.5  PROT 6.9 6.9 6.9  ALBUMIN 3.1* 3.1* 3.2*    No results for input(s): LIPASE, AMYLASE in the last 168 hours. No results for input(s): AMMONIA in the last  168 hours. Coagulation Profile: No results for input(s): INR, PROTIME in the last 168 hours. Cardiac Enzymes: No results for input(s): CKTOTAL, CKMB, CKMBINDEX, TROPONINI in the last 168 hours. BNP (last 3 results) Recent Labs    07/23/20 1000  PROBNP 312*    HbA1C: No results for input(s): HGBA1C in the last 72 hours. CBG: No results for input(s): GLUCAP in the last 168 hours. Lipid Profile: No results for input(s): CHOL, HDL, LDLCALC, TRIG, CHOLHDL, LDLDIRECT in the last 72 hours. Thyroid Function Tests: No results for input(s): TSH, T4TOTAL, FREET4, T3FREE, THYROIDAB in the last 72 hours.  Anemia Panel: Recent Labs    07/22/21 0601  FOLATE 7.8  FERRITIN 15  TIBC 346  IRON 30  RETICCTPCT 2.1   Sepsis Labs: No results for input(s): PROCALCITON, LATICACIDVEN in the last 168 hours.  Recent Results (from the past 240 hour(s))  Resp Panel by RT-PCR (Flu A&B, Covid) Nasopharyngeal Swab     Status: None   Collection Time: 07/18/21  6:53 PM   Specimen: Nasopharyngeal Swab; Nasopharyngeal(NP) swabs in vial transport medium  Result Value Ref Range Status   SARS Coronavirus 2 by RT PCR NEGATIVE NEGATIVE Final    Comment: (NOTE) SARS-CoV-2 target nucleic acids are NOT DETECTED.  The SARS-CoV-2 RNA is generally detectable in upper respiratory specimens during the acute phase of infection. The lowest concentration of SARS-CoV-2 viral copies this assay can detect is 138 copies/mL. A negative result does not preclude SARS-Cov-2 infection and should not be used as the sole basis for treatment or other patient management decisions. A negative result may occur with  improper specimen collection/handling,  submission of specimen other than nasopharyngeal swab, presence of viral mutation(s) within the areas targeted by this assay, and inadequate number of viral copies(<138 copies/mL). A negative result must be combined with clinical observations, patient history, and epidemiological information. The expected result is Negative.  Fact Sheet for Patients:  EntrepreneurPulse.com.au  Fact Sheet for Healthcare Providers:  IncredibleEmployment.be  This test is no t yet approved or cleared by the [place] FDA and  has been authorized for detection and/or diagnosis of SARS-CoV-2 by FDA under an Emergency Use Authorization (EUA). This EUA will remain  in effect (meaning this test can be used) for the duration of the COVID-19 declaration under Section 564(b)(1) of the Act, 21 U.S.C.section 360bbb-3(b)(1), unless the authorization is terminated  or revoked sooner.       Influenza A by PCR NEGATIVE NEGATIVE Final   Influenza B by PCR NEGATIVE NEGATIVE Final    Comment: (NOTE) The Xpert Xpress SARS-CoV-2/FLU/RSV plus assay is intended as an aid in the diagnosis of influenza from Nasopharyngeal swab specimens and should not be used as a sole basis for treatment. Nasal washings and aspirates are unacceptable for Xpert Xpress SARS-CoV-2/FLU/RSV testing.  Fact Sheet for Patients: EntrepreneurPulse.com.au  Fact Sheet for Healthcare Providers: IncredibleEmployment.be  This test is not yet approved or cleared by the [place] FDA and has been authorized for detection and/or diagnosis of SARS-CoV-2 by FDA under an Emergency Use Authorization (EUA). This EUA will remain in effect (meaning this test can be used) for the duration of the COVID-19 declaration under Section 564(b)(1) of the Act, 21 U.S.C. section 360bbb-3(b)(1), unless the authorization is terminated or revoked.  Performed at [facility], [address] [place], [place] [address]    RN Pressure Injury Documentation:     Estimated body mass index is 41.43 kg/m as calculated from the following:  Height as of this encounter: 5\' 3"  (1.6 m).   Weight as of this encounter: 106.1 kg.  Malnutrition Type:   Malnutrition Characteristics:   Nutrition Interventions:    Radiology Studies: ECHOCARDIOGRAM LIMITED  Result Date: 07/21/2021    ECHOCARDIOGRAM LIMITED REPORT   Patient Name:   [name] Date of Exam: 07/21/2021 Medical Rec #:  [id number]    Height:       63.0 in Accession #:    [id number]   Weight:       234.0 lb Date of Birth:  08-12-1970    BSA:          2.067 m Patient Age:    59 years     BP:           83/59 mmHg Patient Gender: F            HR:           81 bpm. Exam Location:  [facility] Procedure: Limited Echo, Color Doppler, Cardiac Doppler and Intracardiac            Opacification Agent Indications:     Systolic CHF  History:         Patient has prior history of Echocardiogram examinations.                  Cardiomyopathy, CAD, Defibrillator; Risk Factors:Hypertension                  and Morbid Obesity.  Sonographer:     L Thornton-Maynard Referring Phys:  [place] Diagnosing Phys: Nelva Bush MD IMPRESSIONS  1. Left ventricular ejection fraction, by estimation, is 20 to 25%. The left ventricle has severely decreased function. The left ventricle demonstrates global hypokinesis. The left ventricular internal cavity size was severely dilated. Indeterminate diastolic filling due to E-A fusion.  2. Right ventricular systolic function is moderately reduced. The right ventricular size is normal. There is normal pulmonary artery systolic pressure.  3. The mitral valve is abnormal. Severe mitral valve regurgitation. No evidence of mitral stenosis.  4. The aortic valve was not well visualized. Aortic valve regurgitation is not visualized. No aortic stenosis is present.  5. The inferior vena cava is normal in size with  greater than 50% respiratory variability, suggesting right atrial pressure of 3 mmHg. FINDINGS  Left Ventricle: Left ventricular ejection fraction, by estimation, is 20 to 25%. The left ventricle has severely decreased function. The left ventricle demonstrates global hypokinesis. Definity contrast agent was given IV to delineate the left ventricular endocardial borders. The left ventricular internal cavity size was severely dilated. There is no left ventricular hypertrophy. Indeterminate diastolic filling due to E-A fusion. Right Ventricle: The right ventricular size is normal. No increase in right ventricular wall thickness. Right ventricular systolic function is moderately reduced. There is normal pulmonary artery systolic pressure. The tricuspid regurgitant velocity is 2.32 m/s, and with an assumed right atrial pressure of 3 mmHg, the estimated right ventricular systolic pressure is 10.1 mmHg. Mitral Valve: The mitral valve is abnormal. Severe mitral valve regurgitation. No evidence of mitral valve stenosis. MV peak gradient, 7.1 mmHg. The mean mitral valve gradient is 3.0 mmHg. Tricuspid Valve: The tricuspid valve is not well visualized. Tricuspid valve regurgitation is mild. Aortic Valve: The aortic valve was not well visualized. Aortic valve regurgitation is not visualized. No aortic stenosis is present. Aortic valve mean gradient measures 3.0 mmHg. Aortic valve peak gradient measures 5.3 mmHg. Aortic valve area, by VTI  measures 1.45 cm. Pulmonic Valve: The pulmonic valve was not well visualized. Pulmonary Artery: The pulmonary artery is of normal size. Venous: The inferior vena cava is normal in size with greater than 50% respiratory variability, suggesting right atrial pressure of 3 mmHg. Additional Comments: A device lead is visualized in the right ventricle. LEFT VENTRICLE PLAX 2D LVIDd:         7.95 cm      Diastology LVIDs:         7.50 cm      LV e' medial:    5.92 cm/s LV PW:         1.00 cm      LV  E/e' medial:  20.1 LV IVS:        0.75 cm      LV e' lateral:   4.62 cm/s LVOT diam:     2.20 cm      LV E/e' lateral: 25.8 LV SV:         28 LV SV Index:   14 LVOT Area:     3.80 cm  LV Volumes (MOD) LV vol d, MOD A2C: 314.0 ml LV vol d, MOD A4C: 277.0 ml LV vol s, MOD A2C: 241.0 ml LV vol s, MOD A4C: 255.0 ml LV SV MOD A2C:     73.0 ml LV SV MOD A4C:     277.0 ml LV SV MOD BP:      68.6 ml LEFT ATRIUM         Index LA diam:    5.50 cm 2.66 cm/m  AORTIC VALVE AV Area (Vmax):    1.60 cm AV Area (Vmean):   1.65 cm AV Area (VTI):     1.45 cm AV Vmax:           115.00 cm/s AV Vmean:          77.600 cm/s AV VTI:            0.194 m AV Peak Grad:      5.3 mmHg AV Mean Grad:      3.0 mmHg LVOT Vmax:         48.30 cm/s LVOT Vmean:        33.700 cm/s LVOT VTI:          0.074 m LVOT/AV VTI ratio: 0.38 MITRAL VALVE                  TRICUSPID VALVE MV Area (PHT): 9.37 cm       TR Peak grad:   21.5 mmHg MV Area VTI:   1.19 cm       TR Vmax:        232.00 cm/s MV Peak grad:  7.1 mmHg MV Mean grad:  3.0 mmHg       SHUNTS MV Vmax:       1.33 m/s       Systemic VTI:  0.07 m MV Vmean:      79.4 cm/s      Systemic Diam: 2.20 cm MV Decel Time: 81 msec MR Peak grad:    75.0 mmHg MR Mean grad:    42.0 mmHg MR Vmax:         433.00 cm/s MR Vmean:        299.0 cm/s MR PISA:         5.09 cm MR PISA Eff ROA: 59 mm MR PISA Radius:  0.90 cm MV E velocity: 119.00 cm/s MV A velocity: 59.10 cm/s MV E/A  ratio:  2.01 Nelva Bush MD Electronically signed by Nelva Bush MD Signature Date/Time: 07/21/2021/12:12:49 PM    Final     Scheduled Meds:  amiodarone  200 mg Oral Daily   aspirin EC  81 mg Oral Daily   carvedilol  3.125 mg Oral BID WC   dapagliflozin propanediol  10 mg Oral Daily   digoxin  0.125 mg Oral Daily   enoxaparin (LOVENOX) injection  0.5 mg/kg Subcutaneous Q24H   furosemide  40 mg Intravenous BID   levothyroxine  25 mcg Oral QAC breakfast   potassium chloride SA  40 mEq Oral BID   quiniDINE gluconate  324 mg  Oral BID   rosuvastatin  40 mg Oral Daily   sacubitril-valsartan  1 tablet Oral BID   spironolactone  12.5 mg Oral QHS   ticagrelor  90 mg Oral BID   Continuous Infusions:   LOS: 4 days   Kerney Elbe, DO [facility] PAGER is on AMION  If 7PM-7AM, please contact night-coverage [email or web address]

## 2021-07-23 NOTE — Discharge Summary (Addendum)
Physician Discharge Summary  [name] HYW:[id number] DOB: 11/10/1970 DOA: 07/18/2021  PCP: Patient, No Pcp Per (Inactive)  Admit date: 07/18/2021 Discharge date: 07/22/2021  Admitted From: Home Disposition: Home  Recommendations for Outpatient Follow-up:  Follow up with PCP in 1-2 weeks Follow-up with cardiology within 1 to 2 weeks Please obtain CMP/CBC, mag, Phos in one week Please follow up on the following pending results:  Home Health: No Equipment/Devices: None  Discharge Condition: Stable CODE STATUS: Full code Diet recommendation: Heart healthy carb modified diet  Brief/Interim Summary: Brief Narrative:  The patient is a 51 year old African-American female with a past medical history significant for [place] to hypothyroidism, CAD, history of nonischemic cardiomyopathy status post AICD placement, history of peptic ulcer disease, chronic systolic and diastolic CHF as well as other comorbidities who presented to the ED with acute onset of dyspnea as well as orthopnea and bilateral lower extremity swelling with dyspnea on exertion and abdominal distention and swelling has been worsening over last 3 weeks and has significantly gotten worse over several days.  Over the last week her diuretic therapy had been increased torsemide 4 tablets 2 at night.  She had been having worsening cough productive for clear sputum the last few days.  She felt that she is still retaining fluids and has been compliant with her medications.  She denied chest pain or palpitations.  No nausea vomiting or abdominal pain but she did describe abdominal distention.  She was seen in urgent care yesterday and was advised to come to the ED.  Upon arrival to the ED she had an elevated heart rate and respiratory rate.  She had mild hyponatremia and slightly elevated creatinine compared to her prior value on 07/16/2021.  EKG showed sinus tachycardia and chest x-ray showed cardiomegaly with vascular congestion.  She is  given IV Lasix 60 mg and admitted to cardiac telemetry for further evaluation management and cardiology was consulted for assistance.  Her diuresis was changed to IV Bumex 1 mg p.o. twice daily and then subsequently changed to 80 mg of Lasix twice daily but now she will be decreased to IV 40 twice daily given her rising creatinine.  Since yesterday her BUN and creatinine has been relatively stable and essentially the same and now slightly improved. Cardiology was recommending continue IV furosemide 40 mg twice daily while she was hospitalized and then transition to her home diuretics.  She will need an ambulatory home O2 screen prior to discharge and she did not desaturate.  Cardiology cleared patient to be discharged home this afternoon and she was deemed medically stable and cleared to follow-up with her PCP and cardiology outpatient setting.  Discharge Diagnoses:  Principal Problem:   Acute CHF (congestive heart failure) (HCC)  Acute on chronic combined systolic and diastolic CHF with history of nonischemic cardiomyopathy status post AICD placement -He was admitted to cardiac telemetry bed -She was initiated on IV diuresis with IV Lasix 60 mg and was changed to IV Bumex 1 mg twice daily and then subsequently changed to IV Lasix 80 mg po BID and further reduced to IV 40 mg twice daily will continue for today -Troponins never checked for some reason so we will check now -BNP was 1331.6 -Had a 2D echo in May of 2022 with a EF of 25% and grade 2 diastolic dysfunction with a moderately dilated left atrium and mild to moderate mitral regurg -Cardiology is being consulted for further evaluation and is aware -Strict I's and O's and daily weights -  Torsemide was recently increased to 80 mg twice daily as outpatient and ICM monitoring on 07/15/2020 showed normal fluid levels -Presented with increased shortness of breath and elevated BNP with increased vascular congestion on chest x-ray -Her home meds include  torsemide 80 mg p.o. twice daily, Entresto 24-26 mg p.o. twice daily, carvedilol 6.25 mg p.o. twice daily, small [place] 25 mg daily, digoxin 0.625 mg p.o. daily and Farxiga 10 mg Daily-she is given IV Lasix 60 mg and then transition to IV Bumex 1 mg twice daily given that she was concerned with the poor response to Lasix therapy but then changed back to IV Lasix 80 mg twice daily; because our slight bump in creatinine cardiology is now decreased the diuresis to 40 mg IV twice daily and regular.  Her echocardiogram is being repeated  -Repeat Limited ECHOCardiogram 07/21/21 showed "Left ventricular ejection fraction, by estimation, is 20 to 25%. The left ventricle has severely decreased function. The left ventricle demonstrates global  hypokinesis. The left ventricular internal cavity size was severely dilated. Indeterminate diastolic filling due to E-A fusion. Right ventricular systolic function is moderately reduced. The right ventricular size is normal. There is normal pulmonary artery systolic  pressure. The mitral valve is abnormal. Severe mitral valve regurgitation. No evidence of mitral stenosis. The aortic valve was not well visualized. Aortic valve regurgitation is not visualized. No aortic stenosis is present. The inferior vena cava is normal in size with greater than 50% respiratory variability, suggesting right atrial pressure of 3 mmHg."  -Currently we will continue her amiodarone, her Bumex, carvedilol, digoxin, Entresto, and dapagliflozin as well as spironolactone 25 mg p.o. nightly; cardiology is going to decrease her carvedilol from 6.25 mg to 3.125 twice daily and spironolactone from 25 mg nightly to 12.5 nightly and will hold her evening Entresto -We will defer diuresis to cardiology and continue monitoring her renal function carefully and cardiology has now recommended transitioning her back to her p.o. diuresis given that she significantly improved with IV furosemide.  They feel that she was  back to her baseline and recommended discharging home on her home diuretics -Continue monitor for signs and symptoms of volume overload and repeat chest x-ray 07/20/21 shows no acute cardiopulmonary abnormality noted -Follow-up with cardiology in outpatient setting   Hypothyroidism -Checked TSH and was 2.512 -Continue with levothyroxine 25 mcg p.o. daily   Dyslipidemia/hyperlipidemia -Continue statin therapy with Rosuvastatin 40 mg.   History of nonsustained ventricular tachycardia PVCs -Continue on her home amiodarone 200 mg p.o. daily and quinidine gluconate 324 mg p.o. twice daily -Continue to monitor on telemetry   Leukocytosis, improved -Likely reactive in the setting of above and is improved -Patient's WBC went from 14.8 -> 9.9 -> 10.5 -> 7.9 -> 8.9 -Continue to monitor for signs and symptoms infection; no   Hypertension -We will continue home digoxin, and medication adjustments will be made by cardiology and Coreg has been reduced and spironolactone has been reduced and her Delene Loll will be held -Continue monitor blood pressures per protocol -Last blood pressure reading was 111/95   History of CVA status post thrombectomy and stent placement -Continuing Aspirin 81 mg po Daily and Ticagrelor 90 mg p.o. twice daily as well as rosuvastatin 40 mg p.o. daily   AKI on CKD Stage 3a -Patient's BUNs/creatinine went from 11/1.34 -> 13/1.43 -> 16/1.19 and is now slightly bumped and gone to 17/1.40 -> 17/1.39 -> 18/1.31 -Continue with diuresis as above per cardiology recommendations -Avoid further nephrotoxic medications, contrast dyes, hypotension and  renally dose medications -Repeat CMP in a.m.   Hyponatremia -Patient's sodium went from 137 is now 131 -Likely in the setting of diuresis -Continue to monitor and trend and repeat CMP in a.m.   Abnormal LFTs, improved -AST is mildly elevated at 49 and ALT is 52; now AST is now 22  and ALT is now 34 -In the setting of vascular  congestion and volume overload -Continue monitor and trend and repeat CMP in a.m. and if necessary will obtain a right upper quadrant ultrasound as well as an acute hepatitis panel   Microcytic Anemia  -Patient's hemoglobin/hematocrit went from 10.1/33.4 -> 9.5/31.4 -> 10.5/34.9 -> 10.6/34.8 -> 10.6/35.0 -Checked Anemia Panel and showed an iron level of 30, U IBC of 316, TIBC of 346, saturation ratio of 9%, ferritin level of 15, folate level 7.8 -Monitor for signs and symptoms bleeding; currently no overt bleeding noted -Repeat CBC in a.m.   Hypokalemia -Patient's K+ has gone from 3.9 -> 3.8 -> 3.4 -> 4.4 -> 4.1 -> 4.2 -Continue to Monitor and Repelte as Necssary -Repeat CMP in the AM    Morbid Obesity -Complicates overall prognosis and care -Estimated body mass index is 41.43 kg/m as calculated from the following:   Height as of this encounter: 5\' 3"  (1.6 m).   Weight as of this encounter: 106.1 kg.  -Weight Loss and Dietary Counseling given   Discharge Instructions  Discharge Instructions     (HEART FAILURE PATIENTS) Call MD:  Anytime you have any of the following symptoms: 1) 3 pound weight gain in 24 hours or 5 pounds in 1 week 2) shortness of breath, with or without a dry hacking cough 3) swelling in the hands, feet or stomach 4) if you have to sleep on extra pillows at night in order to breathe.   Complete by: As directed    Call MD for:  difficulty breathing, headache or visual disturbances   Complete by: As directed    Call MD for:  extreme fatigue   Complete by: As directed    Call MD for:  hives   Complete by: As directed    Call MD for:  persistant dizziness or light-headedness   Complete by: As directed    Call MD for:  persistant nausea and vomiting   Complete by: As directed    Call MD for:  redness, tenderness, or signs of infection (pain, swelling, redness, odor or green/yellow discharge around incision site)   Complete by: As directed    Call MD for:  severe  uncontrolled pain   Complete by: As directed    Call MD for:  temperature >100.4   Complete by: As directed    Diet - low sodium heart healthy   Complete by: As directed    Discharge instructions   Complete by: As directed    You were cared for by a hospitalist during your hospital stay. If you have any questions about your discharge medications or the care you received while you were in the hospital after you are discharged, you can call the unit and ask to speak with the hospitalist on call if the hospitalist that took care of you is not available. Once you are discharged, your primary care physician will handle any further medical issues. Please note that NO REFILLS for any discharge medications will be authorized once you are discharged, as it is imperative that you return to your primary care physician (or establish a relationship with a primary care physician if  you do not have one) for your aftercare needs so that they can reassess your need for medications and monitor your lab values.  Follow up with PCP and Cardiology within 1-2 weeks. Take all medications as prescribed. If symptoms change or worsen please return to the ED for evaluation   Increase activity slowly   Complete by: As directed       Allergies as of 07/22/2021   No Known Allergies      Medication List     TAKE these medications    amiodarone 200 MG tablet Commonly known as: PACERONE TAKE 1 TABLET BY MOUTH ONCE DAILY   Aspirin Low Dose 81 MG EC tablet Generic drug: aspirin TAKE 1 TABLET BY MOUTH ONCE A DAY   Brilinta 90 MG Tabs tablet Generic drug: ticagrelor TAKE 1 TABLET BY MOUTH TWICE A DAY   carvedilol 3.125 MG tablet Commonly known as: COREG Take 1 tablet (3.125 mg total) by mouth 2 (two) times daily with a meal. What changed:  medication strength how much to take   Entresto 24-26 MG Generic drug: sacubitril-valsartan TAKE 1 TABLET BY MOUTH TWICE A DAY   Farxiga 10 MG Tabs tablet Generic  drug: dapagliflozin propanediol TAKE 1 TABLET BY MOUTH ONCE A DAY BEFOREBREAKFAST   levothyroxine 25 MCG tablet Commonly known as: Synthroid Take 1 tablet (25 mcg total) by mouth daily before breakfast.   rosuvastatin 40 MG tablet Commonly known as: Crestor Take 1 tablet (40 mg total) by mouth daily.   spironolactone 25 MG tablet Commonly known as: ALDACTONE TAKE 1 TABLET BY MOUTH EVERY NIGHT AT BEDTIME        Follow-up Information     Larey Dresser, MD. Call.   Specialty: Cardiology Why: Follow up within 1-2 weeks Contact information: [address] [place] [address] [phone]         Deboraha Sprang, MD .   Specialty: Cardiology Contact information: [phone] [address] [address] [phone]         Sueanne Margarita, MD .   Specialty: Cardiology Contact information: [phone] [address] [place] [address] [phone]                No Known Allergies  Consultations: Cardiology  Procedures/Studies: DG Chest 2 View  Result Date: 07/18/2021 CLINICAL DATA:  Shortness of breath EXAM: CHEST - 2 VIEW COMPARISON:  04/19/2020 FINDINGS: Left AICD remains in place, unchanged. Cardiomegaly, vascular congestion. No confluent opacities or effusions. No edema. No acute bony abnormality. IMPRESSION: Cardiomegaly, vascular congestion. Electronically Signed   By: Rolm Baptise M.D.   On: 07/18/2021 18:28   DG Chest Port 1 View  Result Date: 07/20/2021 CLINICAL DATA:  Shortness of breath EXAM: PORTABLE CHEST 1 VIEW COMPARISON:  07/18/2021 FINDINGS: Cardiac shadow remains enlarged. Defibrillator is again noted and stable. Lungs are clear bilaterally. No bony abnormality is seen. IMPRESSION: No acute abnormality noted. Electronically Signed   By: Inez Catalina M.D.   On: 07/20/2021 00:26   ECHOCARDIOGRAM LIMITED  Result Date: 07/21/2021    ECHOCARDIOGRAM LIMITED REPORT   Patient Name:   [name] Date  of Exam: 07/21/2021 Medical Rec #:  [id number]    Height:       63.0 in Accession #:    [id number]   Weight:       234.0 lb Date of Birth:  06-Feb-1971    BSA:  2.067 m Patient Age:    50 years     BP:           83/59 mmHg Patient Gender: F            HR:           81 bpm. Exam Location:  [facility] Procedure: Limited Echo, Color Doppler, Cardiac Doppler and Intracardiac            Opacification Agent Indications:     Systolic CHF  History:         Patient has prior history of Echocardiogram examinations.                  Cardiomyopathy, CAD, Defibrillator; Risk Factors:Hypertension                  and Morbid Obesity.  Sonographer:     L Thornton-Maynard Referring Phys:  [place] Diagnosing Phys: Nelva Bush MD IMPRESSIONS  1. Left ventricular ejection fraction, by estimation, is 20 to 25%. The left ventricle has severely decreased function. The left ventricle demonstrates global hypokinesis. The left ventricular internal cavity size was severely dilated. Indeterminate diastolic filling due to E-A fusion.  2. Right ventricular systolic function is moderately reduced. The right ventricular size is normal. There is normal pulmonary artery systolic pressure.  3. The mitral valve is abnormal. Severe mitral valve regurgitation. No evidence of mitral stenosis.  4. The aortic valve was not well visualized. Aortic valve regurgitation is not visualized. No aortic stenosis is present.  5. The inferior vena cava is normal in size with greater than 50% respiratory variability, suggesting right atrial pressure of 3 mmHg. FINDINGS  Left Ventricle: Left ventricular ejection fraction, by estimation, is 20 to 25%. The left ventricle has severely decreased function. The left ventricle demonstrates global hypokinesis. Definity contrast agent was given IV to delineate the left ventricular endocardial borders. The left ventricular internal cavity size was severely dilated. There is no left ventricular hypertrophy.  Indeterminate diastolic filling due to E-A fusion. Right Ventricle: The right ventricular size is normal. No increase in right ventricular wall thickness. Right ventricular systolic function is moderately reduced. There is normal pulmonary artery systolic pressure. The tricuspid regurgitant velocity is 2.32 m/s, and with an assumed right atrial pressure of 3 mmHg, the estimated right ventricular systolic pressure is 16.0 mmHg. Mitral Valve: The mitral valve is abnormal. Severe mitral valve regurgitation. No evidence of mitral valve stenosis. MV peak gradient, 7.1 mmHg. The mean mitral valve gradient is 3.0 mmHg. Tricuspid Valve: The tricuspid valve is not well visualized. Tricuspid valve regurgitation is mild. Aortic Valve: The aortic valve was not well visualized. Aortic valve regurgitation is not visualized. No aortic stenosis is present. Aortic valve mean gradient measures 3.0 mmHg. Aortic valve peak gradient measures 5.3 mmHg. Aortic valve area, by VTI measures 1.45 cm. Pulmonic Valve: The pulmonic valve was not well visualized. Pulmonary Artery: The pulmonary artery is of normal size. Venous: The inferior vena cava is normal in size with greater than 50% respiratory variability, suggesting right atrial pressure of 3 mmHg. Additional Comments: A device lead is visualized in the right ventricle. LEFT VENTRICLE PLAX 2D LVIDd:         7.95 cm      Diastology LVIDs:         7.50 cm      LV e' medial:    5.92 cm/s LV PW:         1.00 cm  LV E/e' medial:  20.1 LV IVS:        0.75 cm      LV e' lateral:   4.62 cm/s LVOT diam:     2.20 cm      LV E/e' lateral: 25.8 LV SV:         28 LV SV Index:   14 LVOT Area:     3.80 cm  LV Volumes (MOD) LV vol d, MOD A2C: 314.0 ml LV vol d, MOD A4C: 277.0 ml LV vol s, MOD A2C: 241.0 ml LV vol s, MOD A4C: 255.0 ml LV SV MOD A2C:     73.0 ml LV SV MOD A4C:     277.0 ml LV SV MOD BP:      68.6 ml LEFT ATRIUM         Index LA diam:    5.50 cm 2.66 cm/m  AORTIC VALVE AV Area  (Vmax):    1.60 cm AV Area (Vmean):   1.65 cm AV Area (VTI):     1.45 cm AV Vmax:           115.00 cm/s AV Vmean:          77.600 cm/s AV VTI:            0.194 m AV Peak Grad:      5.3 mmHg AV Mean Grad:      3.0 mmHg LVOT Vmax:         48.30 cm/s LVOT Vmean:        33.700 cm/s LVOT VTI:          0.074 m LVOT/AV VTI ratio: 0.38 MITRAL VALVE                  TRICUSPID VALVE MV Area (PHT): 9.37 cm       TR Peak grad:   21.5 mmHg MV Area VTI:   1.19 cm       TR Vmax:        232.00 cm/s MV Peak grad:  7.1 mmHg MV Mean grad:  3.0 mmHg       SHUNTS MV Vmax:       1.33 m/s       Systemic VTI:  0.07 m MV Vmean:      79.4 cm/s      Systemic Diam: 2.20 cm MV Decel Time: 81 msec MR Peak grad:    75.0 mmHg MR Mean grad:    42.0 mmHg MR Vmax:         433.00 cm/s MR Vmean:        299.0 cm/s MR PISA:         5.09 cm MR PISA Eff ROA: 59 mm MR PISA Radius:  0.90 cm MV E velocity: 119.00 cm/s MV A velocity: 59.10 cm/s MV E/A ratio:  2.01 Harrell Gave End MD Electronically signed by Nelva Bush MD Signature Date/Time: 07/21/2021/12:12:49 PM    Final      Subjective: Seen and examined at bedside and she thinks her breathing is doing better.  No nausea or vomiting.  States that incentive spirometer helps her break up her congestion.  No lightheadedness or dizziness.  No other concerns or complaints at this time.  Cardiology then cleared her to be discharged home this afternoon so she will follow-up with her PCP and cardiology outpatient setting and she was discharged home in stable condition  Discharge Exam: Vitals:   07/22/21 1524 07/22/21 1726  BP: (!) 84/51 99/67  Pulse:  80   Resp: 16   Temp: 97.6 F (36.4 C)   SpO2: 100%    Vitals:   07/22/21 0914 07/22/21 1149 07/22/21 1524 07/22/21 1726  BP: 93/68 (!) 111/95 (!) 84/51 99/67  Pulse: 77 68 80   Resp: 18 16 16    Temp: 97.8 F (36.6 C) 98.1 F (36.7 C) 97.6 F (36.4 C)   TempSrc: Oral Oral    SpO2: 100% 100% 100%   Weight:      Height:          Examination: Physical Exam:   Constitutional: WN/WD morbidly obese African-American female currently in no acute distress appears calm Eyes: Lids and conjunctivae normal, sclerae anicteric  ENMT: External Ears, Nose appear normal. Grossly normal hearing. Mucous membranes are moist.  Neck: Appears normal, supple, no cervical masses, normal ROM, no appreciable thyromegaly; no appreciable JVD Respiratory: Diminished to auscultation bilaterally with coarse breath sounds but no real appreciable no wheezing, rales, rhonchi or crackles. Normal respiratory effort and patient is not tachypenic. No accessory muscle use.  Unlabored breathing and not wearing any supplemental oxygen nasal cannula Cardiovascular: RRR, no murmurs / rubs / gallops. S1 and S2 auscultated.  Slight lower extremity edema Abdomen: Soft, non-tender, distended secondary body habitus. Bowel sounds positive.  GU: Deferred. Musculoskeletal: No clubbing / cyanosis of digits/nails. No joint deformity upper and lower extremities. Skin: No rashes, lesions, ulcers on limited skin evaluation. No induration; Warm and dry.  Neurologic: CN 2-12 grossly intact with no focal deficits. Romberg sign and cerebellar reflexes not assessed.  Psychiatric: Normal judgment and insight. Alert and oriented x 3. Normal mood and appropriate affect.   The results of significant diagnostics from this hospitalization (including imaging, microbiology, ancillary and laboratory) are listed below for reference.    Microbiology: No results found for this or any previous visit (from the past 240 hour(s)).   Labs: BNP (last 3 results) Recent Labs    03/26/21 0937 07/18/21 1647  BNP 343.7* 0,865.7*   Basic Metabolic Panel: Recent Labs  Lab 07/31/21 1023  NA 139  K 3.8  CL 104  CO2 26  GLUCOSE 79  BUN 15  CREATININE 1.39*  CALCIUM 9.6  MG 2.1   Liver Function Tests: No results for input(s): AST, ALT, ALKPHOS, BILITOT, PROT, ALBUMIN in the last 168  hours.  No results for input(s): LIPASE, AMYLASE in the last 168 hours. No results for input(s): AMMONIA in the last 168 hours. CBC: Recent Labs  Lab 07/31/21 1023  WBC 8.0  HGB 11.3*  HCT 37.2  MCV 74.1*  PLT 390   Cardiac Enzymes: No results for input(s): CKTOTAL, CKMB, CKMBINDEX, TROPONINI in the last 168 hours. BNP: Invalid input(s): POCBNP CBG: No results for input(s): GLUCAP in the last 168 hours. D-Dimer No results for input(s): DDIMER in the last 72 hours. Hgb A1c No results for input(s): HGBA1C in the last 72 hours. Lipid Profile No results for input(s): CHOL, HDL, LDLCALC, TRIG, CHOLHDL, LDLDIRECT in the last 72 hours. Thyroid function studies No results for input(s): TSH, T4TOTAL, T3FREE, THYROIDAB in the last 72 hours.  Invalid input(s): FREET3 Anemia work up Recent Labs    07/31/21 1023  FERRITIN 12  TIBC 416  IRON 36   Urinalysis    Component Value Date/Time   COLORURINE YELLOW 08/21/2020 0943   APPEARANCEUR CLOUDY (A) 08/21/2020 0943   APPEARANCEUR Turbid 09/19/2013 1635   LABSPEC 1.014 08/21/2020 0943   LABSPEC 1.027 09/19/2013 1635   PHURINE 5.0 08/21/2020  [place] 08/21/2020 0943   GLUCOSEU Negative 09/19/2013 1635   HGBUR SMALL (A) 08/21/2020 0943   BILIRUBINUR NEGATIVE 08/21/2020 0943   BILIRUBINUR Negative 09/19/2013 1635   KETONESUR NEGATIVE 08/21/2020 0943   PROTEINUR NEGATIVE 08/21/2020 0943   NITRITE NEGATIVE 08/21/2020 0943   LEUKOCYTESUR NEGATIVE 08/21/2020 0943   LEUKOCYTESUR Negative 09/19/2013 1635   Sepsis Labs Invalid input(s): PROCALCITONIN,  WBC,  LACTICIDVEN Microbiology No results found for this or any previous visit (from the past 240 hour(s)).  Time coordinating discharge: 35 minutes  SIGNED:  Kerney Elbe, DO [facility] 08/02/2021, 8:52 PM Pager is on [place]  If 7PM-7AM, please contact night-coverage [email or web address]

## 2021-07-29 NOTE — Progress Notes (Addendum)
ID:  [name], DOB 1970-09-01, MRN [id number]    Provider location: [place] [facility] Type of Visit: Established patient    PCP:  [name], MD    HF Cardiologist:  [name], MD  History of Present Illness: [name] is a 51 y.o. 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  She was followed for systolic CHF initially in [place]. Apparently her EF recovered to 35-40%. However, she was admitted to [facility] 09/19/13 for worsening dyspnea and CP. Coronaries were reportedly OK on LHC at that time at [facility]. Echo showed EF of 15-20% with four-chamber enlargement with moderate-severe TR/MR. RHC as below. Rheumatological w/u negative.  CPX in 9/16 showed near-normal functional capacity.  Repeat echo in 6/17 shows that EF remains 20%. Repeat CPX in 1/18 was submaximal but probably only mild circulatory limitation.  Echo 8/18 showed EF 25-30%, severe LV dilation.  CPX in 6/19 showed low normal functional capacity.  Echo in 10/19 showed EF 20-25%, moderate LV dilation.    Zio patch in 2/20 with 11% PVCs and NSVT, amiodarone started. Zio patch in 3/20 with 13.3% PVCs and NSVT.  She saw Dr. [name], he thought that increased PVCs were electromechanical in nature due to worsening heart failure.  Repeat Zio patch on amiodarone in 1/21 showed 6.2% PVCs.   She was admitted in 4/20 with VT in setting of hypokalemia.    CPX in 9/20 showed mild functional limitation, more due to body habitus than heart failure.  Echo in 4/21 showed EF 20% with severe LV dilation, diffuse hypokinesis, normal RV size and systolic function.   In 8/21, she had a shock for VF in the setting of hypokalemia.   Patient was admitted in 10/21 with CVA involving left MCA territory.  This was thought to be cardioembolic but LV thrombus was not visualized and no atrial fibrillation has been seen.   Echo in 10/21 showed EF 20-25%, severe LV dilation (no LV thrombus seen), mildly decreased RV systolic function, mild-moderate MR. She was taken by IR for thrombectomy and stent placement, now on ASA 81 and ticagrelor.   She had recurrent VT in 2/22, quinidine was added by EP.  Spironolactone and Coreg were decreased due to low BP and lightheadedness.    Echo in 4/22 showed EF 25% with severe LV dilation and global hypokinesis, normal RV, mild-moderate MR.   CPX (8/22) showed peak VO2 14.3, VE/VCO2 slope 42, RER 1.09 => mild to moderate HF limitation, elevated VE/VCO2 slope is likely false from end exercise hyperventilation. Restrictive lung physiology from obesity.   Follow up 10/22 doing well, stable NYHA II symptoms, carvedilol increased. Awaiting insurance approval for baroreceptor activation therapy.  Admitted 1/23 with CHF, diuresed with IV lasix. Carvediolol reduced to 3.125 bid. EP saw, no changes   Today she returns for post hospital HF follow up with her husband. Overall feeling fatigued. She remains SOB with ADLs and minimal activity. Chronically sleeps reclined. Denies abnormal bleeding, CP, dizziness, edema. Has occasional palpitations and chronically sleep reclined. Appetite ok. No fever or chills. Weight at home 227 pounds. Taking all medications.   ECG (personally reviewed): NSR LBBB, QRS 144 msec  Device interrogation (personally reviewed): OptiVol down, thoracic impedence stable, daily activity < 1 hour.  PMH: 1. OSA: Using CPAP.  2. PVCs:  - Zio patch 2/20: 11% PVCs -  Zio patch 3/20: 13.3% PVCs - Zio patch 1/21: 6.2% PVCs 3. VT/VF: 8/21, in setting of hypokalemia.  2/22 VT, quinidine added.  4. 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  No LV thrombus visualized and atrial fibrillation has not been visualized.  - IR thrombectomy with stent placement in 10/21.  5. Chronic systolic CHF: Nonischemic cardiomyopathy:   - Coronary angiography 2015 at [facility]: Normal coronaries.  - Echo  (8/15) with EF 20%, moderate LV dilation, diffuse hypokinesis, normal RV size with mildly decreased systolic function, mild MR.  - Echo (5/16) with EF 20%, spherical LV with diffuse hypokinesis, mild MR.  - Echo (6/17) with EF 20%, diffuse hypokinesis, moderate LV dilation, normal RV, mild to moderate MR.  - RHC (12/17): mean RA 5, PA 56/31, mean PCWP 24, CI 2, PVR 3.7 WU - Echo (8/18) with EF 25-30%, severe LV dilation, severe LAE.  - Echo (2/20) with EF 20-25%, moderate LV dilation, moderate MR.  - Echo (4/21) with EF 20-25%, severe LV dilation, mild-moderate MR, normal RV, PASP 26 mmHg.  - Echo (10/21) with EF 20-25%, severe LV dilation (no LV thrombus seen), mildly decreased RV systolic function, mild-moderate MR. - CPX in 9/20 showed mild functional limitation, more due to body habitus than heart failure.  - CPX in 8/21: Peak VO2 11.6, VE/VCO2 32, RER 1.2.  Mild HF limitation.  - Echo (4/22): EF 25% with severe LV dilation and global hypokinesis, normal RV, mild-moderate MR.  - CPX (8/22): peak VO2 14.3, VE/VCO2 slope 42, RER 1.09 => mild to moderate HF limitation, elevated VE/VCO2 slope is likely false from end exercise hyperventilation. Restrictive lung physiology from obesity. - Ltd echo (1/23) showed EF 20-25%, severely decreased LV function with global HK, RV moderately reduced, severe MR.   6. PFTs (1/22): Near normal.  7. Fe deficiency anemia   Labs: (11/09/13): Dig level 0.5, K 3.6, Creatinine 0.83, pro-BNP 622 (12/19/13): K 3.6, creatinine 0.8 (7/15): K 4.1, creatinine 0.91 (8/15): K 3.9, creatinine 0.91 (10/15): K 4, creatinine 0.93, proBNP 129 (12/15): K 4, creatinine 0.9, digoxin 0.4 (5/16): K 4, creatinine 0.86 (9/16): K 3.6, creatinine 0.86, digoxin < 0.2 (5/17): K 4.1, creatinine 0.95, digoxin 0.3 (5/18): K 3.4, creatinine 0.95, TSH elevated but free T3 and free T4 normal.  (8/18): K 3.9, creatinine 0.87, BNP 108  (11/18): K 3.5, creatinine 1.04, digoxin 0.4 (1/19): LDL  91, HDL 47 (3/19): digoxin 0.7, K 4, creatinine 0.99 (4/19): TSH normal, LDL 95 (9/19): LDL 99, HDL 45, K 4, creatinine 0.96 (10/19): K 3.6, creatinine 0.85, digoxin 0.5 (2/20): LDL 103 (3/20): K 3.9, creatinine 1.05 (4/20): K 3.9, creatinine 1.23 => 1.15, TSH 8, digoxin 0.3, LFTs normal 6/20): K 4.3, creatinine 1.07, LFTs normal, digoxin 0.5 (7/20): K 3.8, creatinine 1.16 (8/20): digoxin < 0.2, LDL 135 (1/21): K 3.9, creatinine 1.05, LDL 171  (4/21): K 4.2, creatinine 0.95, LDL 127, TSH normal, LFTs normal (10/21): K 2.8, creatinine 1.21, LDL 130 (11/21): K 4.5, creatinine 1.3 => 1.2, LFTs normal, digoxin 1.3, TSH normal (3/22): K 4.1, creatinine 1.36 (4/22): hgb 10.8 (7/22): Transferrin 8%, digoxin 0.9, hgb 10.1, K 3.9, creatinine 1.42 (8/22): digoxin 0.4 (9/22): K 4.2, creatinine 1.32 (1/23): K 4.2, creatinine 1.31  Current Outpatient Medications  Medication Sig Dispense Refill   amiodarone (PACERONE) 200 MG tablet TAKE 1 TABLET BY MOUTH ONCE DAILY 90 tablet 1   ASPIRIN LOW DOSE 81 MG EC tablet TAKE 1 TABLET BY MOUTH ONCE A DAY 30 tablet 11  BRILINTA 90 MG TABS tablet TAKE 1 TABLET BY MOUTH TWICE A DAY 60 tablet 11   carvedilol (COREG) 3.125 MG tablet Take 1 tablet (3.125 mg total) by mouth 2 (two) times daily with a meal. 60 tablet 0   digoxin (LANOXIN) 0.125 MG tablet TAKE 1 TABLET BY MOUTH ONCE A DAY 30 tablet 11   ENTRESTO 24-26 MG TAKE 1 TABLET BY MOUTH TWICE A DAY 60 tablet 11   FARXIGA 10 MG TABS tablet TAKE 1 TABLET BY MOUTH ONCE A DAY BEFOREBREAKFAST 30 tablet 4   levothyroxine (SYNTHROID) 25 MCG tablet Take 1 tablet (25 mcg total) by mouth daily before breakfast. 90 tablet 3   potassium chloride SA (KLOR-CON M) 20 MEQ tablet Take 40 mEq by mouth 2 (two) times daily.     quiniDINE gluconate 324 MG CR tablet TAKE 1 TABLET BY MOUTH TWICE A DAY 60 tablet 6   rosuvastatin (CRESTOR) 40 MG tablet Take 1 tablet (40 mg total) by mouth daily. 30 tablet 5   spironolactone  (ALDACTONE) 25 MG tablet TAKE 1 TABLET BY MOUTH EVERY NIGHT AT BEDTIME 90 tablet 3   torsemide (DEMADEX) 20 MG tablet Take 80 mg by mouth daily. 20 mg in the evening     No current facility-administered medications for this encounter.   Allergies:   Patient has no known allergies.   Social History:  The patient  reports that she has never smoked. She has never used smokeless tobacco. She reports that she does not drink alcohol and does not use drugs.   Family History:  The patient's family history includes Heart disease in her maternal grandmother and mother; High blood pressure in her father; Obesity in her mother; Stroke in her father.   ROS:  Please see the history of present illness.   All other systems are personally reviewed and negative.   Exam:   BP 112/82    Pulse 83    Wt 104.4 kg (230 lb 3.2 oz)    SpO2 99%    BMI 40.78 kg/m  General:  NAD. No resp difficulty, walked into clinic, fatigued-appearing. HEENT: Normal Neck: Supple. No JVD. Carotids 2+ bilat; no bruits. No lymphadenopathy or thryomegaly appreciated. Cor: PMI nondisplaced. Regular rate & rhythm. No rubs, gallops or murmurs. Lungs: Clear Abdomen: Obese, nontender, nondistended. No hepatosplenomegaly. No bruits or masses. Good bowel sounds. Extremities: No cyanosis, clubbing, rash, edema Neuro: Alert & oriented x 3, cranial nerves grossly intact. Moves all 4 extremities w/o difficulty. Affect pleasant.  Recent Labs: 07/18/2021: B Natriuretic Peptide 1,331.6; TSH 2.512 07/22/2021: ALT 34; BUN 18; Creatinine, Ser 1.31; Hemoglobin 10.6; Magnesium 2.0; Platelets 354; Potassium 4.2; Sodium 131  Personally reviewed   Wt Readings from Last 3 Encounters:  07/31/21 104.4 kg (230 lb 3.2 oz)  07/22/21 106.1 kg (233 lb 14.4 oz)  07/05/21 106.7 kg (235 lb 3.2 oz)   ASSESSMENT AND PLAN: 1. Chronic systolic HF: Nonischemic cardiomyopathy.  She has a Medtronic ICD. Cause for cardiomyopathy is uncertain, initially identified  peri-partum (after son's birth), so cannot rule out peri-partum CMP.  On and off, she has had frequent PVCs.  RHC in 8/15 showed normal filling pressures and preserved cardiac output.  CPX 9/20 with mildly decreased functional capacity due to body habitus and HF.  CPX in 8/21 also showed mild functional limitation.  Echo in 10/21 showed stable EF 20-25% with severe LV dilation, mildly decreased RV systolic function, and mild-moderate MR. Echo in 4/22 with EF 25%, severe LV  dilation.  CPX (8/22) with mild-moderate HF limitation.  Recent CHF admission 1/23. Ltd echo showed EF 20-25%. Worse NYHA class IIIb symptoms today, she is not volume overloaded on exam or by device interrogation.   - Continue torsemide 80 qam/20 qpm.  BMET and BNP today. - Will give PRN Rx for metolazone 2.5, take only when directed by AHF clinic. - Continue Entresto 24/26 bid, did not tolerate higher dose due to orthostatic symptoms and syncope.  - Continue Coreg 3.125 mg bid. Will not increase today with on-going fatgue. - Continue spironolactone 25 mg qhs.  - Continue digoxin 0.125 mg daily.  Check level today.   - Continue dapagliflozin 10 mg daily. No GU symptoms. - ECG with LBBB, QRS 144 msec => ? CRT upgrade. Dr. [name] to review with Dr. [name].  - EF remains low, if she worsens clinically would be candidate for LVAD.  BMI too high currently for transplant.   - I had her evaluated for baroreceptor activation therapy.  I think she would be a good candidate => still working with insurance to see if she can get commercial BAT device (versus ANTHEM trial).  2. Pulmonary HTN: Mixed picture with PVR 8.1 on RHC at [facility].  However, repeat study in 8/15 here showed no significant pulmonary arterial hypertension.  2017 RHC with pulmonary venous hypertension.  3. Morbid obesity: Continue to work on weight loss.  She is going to work aggressively on diet and increase her exercise level. Body mass index is 40.78 kg/m. - She was going to  start liraglutide to help with weight loss, however is not currently on med. If we can get her BMI down she may be heart transplant may be future option.  4. Hyperlipidemia: Continue rosuvastatin. Lipids per PCP. 5. PVCs: 11% PVCs + NSVT on 2/20 Zio patch.  Amiodarone started, but PVCs up to 13.3% with NSVT on 3/20 Zio patch. She saw Dr. [name] who thought that the ventricular arrhythmias were electromechanical due to worsening HF.  In 4/20, she had VT x 2 with ICD discharges in the setting of hypokalemia. Zio patch in 1/21 with PVCs down to 6.2% of beats. VT again in 2/22, quinidine started.  - Continue amiodarone, recent LFTs and TSH ok.  She will need a regular eye exam with amiodarone use.  - Continue quinidine per EP.  6. CVA: CVA in 10/21, had thrombectomy with stent placement.  No LV thrombus noted and no atrial fibrillation noted.  - To continue ticagrelor for now, she will get a repeat CTA (~5/23) head and interventional radiology will determine whether she will need to continue ticagrelor.  7. Fe deficiency: She reports heavy periods.  She has had 2 Feraheme infusions recently.  - With worsening fatigue, will obtain CBC and iron studies.  Follow up with Dr. [name] as scheduled next month.  Signed, Rafael Bihari, FNP  07/31/2021  [facility] [address] [facility] and [place] [address] [phone] (office) [phone] (fax)

## 2021-07-30 ENCOUNTER — Other Ambulatory Visit (HOSPITAL_COMMUNITY): Payer: Self-pay | Admitting: Cardiology

## 2021-07-30 ENCOUNTER — Other Ambulatory Visit: Payer: Self-pay | Admitting: Internal Medicine

## 2021-07-30 ENCOUNTER — Ambulatory Visit (INDEPENDENT_AMBULATORY_CARE_PROVIDER_SITE_OTHER): Payer: Medicare Other

## 2021-07-30 DIAGNOSIS — I5022 Chronic systolic (congestive) heart failure: Secondary | ICD-10-CM

## 2021-07-30 DIAGNOSIS — Z95810 Presence of automatic (implantable) cardiac defibrillator: Secondary | ICD-10-CM

## 2021-07-30 NOTE — Progress Notes (Signed)
EPIC Encounter for ICM Monitoring  Patient Name: [name] is a 51 y.o. female Date: 07/30/2021 Primary Care Physican: Patient, No Pcp Per (Inactive) Primary Cardiologist: [name] Electrophysiologist: [name] 06/11/2021 Weight: 234 lbs 07/09/2021 Weight: 234 lbs 07/30/2021 Weight: 222-227 lbs (baseline since being diuresed)     Spoke with patient and heart failure questions reviewed.  She was hospitalized 1/24 for HF and diuresed after xray showed a lot of abdominal fluid.  She is feeling better but continues to have SOB when climbing steps.  She has HF clinic appointment tomorrow and will discuss about taking Metolazone PRN which is no longer on med list.    Optivol Thoracic impedance suggesting normal fluid levels since hospital discharge.   Prescribed:  Torsemide 20 mg Take 4 tablets (80 mg total) by mouth twice a day. Potassium 20 mEq Take 2 tablets (40 mEq total) by mouth twice a day  Spironolactone 25 mg take 1 tablet daily.   Farxiga 10 mg take 1 tablet daily.   Labs: 07/22/2021 Creatinine 1.31, BUN 18, Potassium 4.2, Sodium 131, GFR 50 07/21/2021 Creatinine 1.39, BUN 17, Potassium 4.1, Sodium 137, GFR 46  07/20/2021 Creatinine 1.40, BUN 17, Potassium 4.4, Sodium 137, GFR 46  07/19/2021 Creatinine 1.19, BUN 16, Potassium 3.4, Sodium 135, GFR 56  A complete set of results can be found in Results Review.   Recommendations:   She will discuss concerns and symptoms at 2/8 HF clinic OV.  No changes and encouraged to call if experiencing any fluid symptoms.    Follow-up plan: ICM clinic phone appointment on 08/19/2021.   91 day device clinic remote transmission 09/16/2021.         EP/Cardiology Office Visits:  07/31/2021 with [facility] PA/NP.   09/17/2021 with Dr [name].  Recall 06/23/2021 with Dr [name]   Copy of ICM check sent to Dr. [name].     3 month ICM trend: 07/30/2021.    12-14 Month ICM trend:     [name], RN 07/30/2021 1:17 PM

## 2021-07-31 ENCOUNTER — Ambulatory Visit (HOSPITAL_COMMUNITY)
Admission: RE | Admit: 2021-07-31 | Discharge: 2021-07-31 | Disposition: A | Discharge status: Home / Self Care | Payer: BC Managed Care – PPO | Source: Ambulatory Visit | Attending: Family Medicine | Admitting: Family Medicine

## 2021-07-31 ENCOUNTER — Encounter (HOSPITAL_COMMUNITY): Payer: Self-pay

## 2021-07-31 ENCOUNTER — Other Ambulatory Visit: Payer: Self-pay

## 2021-07-31 ENCOUNTER — Telehealth (HOSPITAL_COMMUNITY): Payer: Self-pay | Admitting: Surgery

## 2021-07-31 VITALS — BP 112/82 | HR 83 | Wt 230.2 lb

## 2021-07-31 DIAGNOSIS — I110 Hypertensive heart disease with heart failure: Secondary | ICD-10-CM | POA: Insufficient documentation

## 2021-07-31 DIAGNOSIS — Z7902 Long term (current) use of antithrombotics/antiplatelets: Secondary | ICD-10-CM | POA: Insufficient documentation

## 2021-07-31 DIAGNOSIS — D649 Anemia, unspecified: Secondary | ICD-10-CM

## 2021-07-31 DIAGNOSIS — E785 Hyperlipidemia, unspecified: Secondary | ICD-10-CM | POA: Diagnosis not present

## 2021-07-31 DIAGNOSIS — N920 Excessive and frequent menstruation with regular cycle: Secondary | ICD-10-CM | POA: Diagnosis not present

## 2021-07-31 DIAGNOSIS — I5022 Chronic systolic (congestive) heart failure: Secondary | ICD-10-CM

## 2021-07-31 DIAGNOSIS — Z79899 Other long term (current) drug therapy: Secondary | ICD-10-CM | POA: Insufficient documentation

## 2021-07-31 DIAGNOSIS — Z8673 Personal history of transient ischemic attack (TIA), and cerebral infarction without residual deficits: Secondary | ICD-10-CM

## 2021-07-31 DIAGNOSIS — I491 Atrial premature depolarization: Secondary | ICD-10-CM | POA: Insufficient documentation

## 2021-07-31 DIAGNOSIS — I447 Left bundle-branch block, unspecified: Secondary | ICD-10-CM | POA: Diagnosis not present

## 2021-07-31 DIAGNOSIS — I493 Ventricular premature depolarization: Secondary | ICD-10-CM | POA: Diagnosis not present

## 2021-07-31 DIAGNOSIS — I272 Pulmonary hypertension, unspecified: Secondary | ICD-10-CM | POA: Diagnosis not present

## 2021-07-31 DIAGNOSIS — Z95810 Presence of automatic (implantable) cardiac defibrillator: Secondary | ICD-10-CM | POA: Diagnosis not present

## 2021-07-31 DIAGNOSIS — I428 Other cardiomyopathies: Secondary | ICD-10-CM | POA: Diagnosis not present

## 2021-07-31 DIAGNOSIS — Z7984 Long term (current) use of oral hypoglycemic drugs: Secondary | ICD-10-CM | POA: Diagnosis not present

## 2021-07-31 DIAGNOSIS — E876 Hypokalemia: Secondary | ICD-10-CM | POA: Insufficient documentation

## 2021-07-31 DIAGNOSIS — Z6841 Body Mass Index (BMI) 40.0 and over, adult: Secondary | ICD-10-CM | POA: Diagnosis not present

## 2021-07-31 DIAGNOSIS — E611 Iron deficiency: Secondary | ICD-10-CM | POA: Insufficient documentation

## 2021-07-31 LAB — IRON AND TIBC
Iron: 36 ug/dL (ref 28–170)
Saturation Ratios: 9 % — ABNORMAL LOW (ref 10.4–31.8)
TIBC: 416 ug/dL (ref 250–450)
UIBC: 380 ug/dL

## 2021-07-31 LAB — BASIC METABOLIC PANEL
Anion gap: 9 (ref 5–15)
BUN: 15 mg/dL (ref 6–20)
CO2: 26 mmol/L (ref 22–32)
Calcium: 9.6 mg/dL (ref 8.9–10.3)
Chloride: 104 mmol/L (ref 98–111)
Creatinine, Ser: 1.39 mg/dL — ABNORMAL HIGH (ref 0.44–1.00)
GFR, Estimated: 46 mL/min — ABNORMAL LOW (ref >60)
Glucose, Bld: 79 mg/dL (ref 70–99)
Potassium: 3.8 mmol/L (ref 3.5–5.1)
Sodium: 139 mmol/L (ref 135–145)

## 2021-07-31 LAB — CBC
HCT: 37.2 % (ref 36.0–46.0)
Hemoglobin: 11.3 g/dL — ABNORMAL LOW (ref 12.0–15.0)
MCH: 22.5 pg — ABNORMAL LOW (ref 26.0–34.0)
MCHC: 30.4 g/dL (ref 30.0–36.0)
MCV: 74.1 fL — ABNORMAL LOW (ref 80.0–100.0)
Platelets: 390 10*3/uL (ref 150–400)
RBC: 5.02 MIL/uL (ref 3.87–5.11)
RDW: 16.6 % — ABNORMAL HIGH (ref 11.5–15.5)
WBC: 8 10*3/uL (ref 4.0–10.5)
nRBC: 0 % (ref 0.0–0.2)

## 2021-07-31 LAB — FERRITIN: Ferritin: 12 ng/mL (ref 11–307)

## 2021-07-31 LAB — DIGOXIN LEVEL: Digoxin Level: 1.4 ng/mL (ref 0.8–2.0)

## 2021-07-31 LAB — MAGNESIUM: Magnesium: 2.1 mg/dL (ref 1.7–2.4)

## 2021-07-31 MED ORDER — METOLAZONE 2.5 MG PO TABS: 2.5000 mg | ORAL_TABLET | ORAL | 0 refills | Status: DC | PRN

## 2021-07-31 NOTE — Patient Instructions (Addendum)
START Metolazone 2.5 mg, one tab AS DIRECTED BY THE HF CLINIC     Labs today We will only contact you if something comes back abnormal or we need to make some changes. Otherwise no news is good news!  Keep cardiology follow up as scheduled with Dr [name]  Do the following things EVERYDAY: Weigh yourself in the morning before breakfast. Write it down and keep it in a log. Take your medicines as prescribed Eat low salt foods--Limit salt (sodium) to 2000 mg per day.  Stay as active as you can everyday Limit all fluids for the day to less than 2 liters  At the [facility], you and your health needs are our priority. As part of our continuing mission to provide you with exceptional heart care, we have created designated Provider Care Teams. These Care Teams include your primary Cardiologist (physician) and Advanced Practice Providers (APPs- Physician Assistants and Nurse Practitioners) who all work together to provide you with the care you need, when you need it.   You may see any of the following providers on your designated Care Team at your next follow up: Dr Glori Bickers Dr Haynes Kerns, NP Lyda Jester, [place] [facility] [place], [place] Audry Riles, PharmD   Please be sure to bring in all your medications bottles to every appointment.   If you have any questions or concerns before your next appointment please send [name] a message through [place] or call our office at [phone].    TO LEAVE A MESSAGE FOR THE NURSE SELECT OPTION 2, PLEASE LEAVE A MESSAGE INCLUDING: YOUR NAME DATE OF BIRTH CALL BACK NUMBER REASON FOR CALL**this is important as we prioritize the call backs  YOU WILL RECEIVE A CALL BACK THE SAME DAY AS LONG AS YOU CALL BEFORE 4:00 PM

## 2021-07-31 NOTE — Telephone Encounter (Signed)
Patient called and informed of results and recommendation per Allena Katz NP.  I will update medication change in [facility].

## 2021-07-31 NOTE — Telephone Encounter (Signed)
-----   Message from Rafael Bihari, [place] sent at 07/31/2021  1:04 PM EST ----- Dig is too high, stop digoxin. Labs suggest iron def anemia, this can be contributing to her fatigue. She needs to follow up with her PCP work up.

## 2021-08-03 DIAGNOSIS — G4733 Obstructive sleep apnea (adult) (pediatric): Secondary | ICD-10-CM | POA: Diagnosis not present

## 2021-08-06 IMAGING — DX DG CHEST 1V PORT
1 series · 1 of 1 positions shown · non-contrast
Comparison: 04/17/2020

CLINICAL DATA: Respiratory failure, ventilatory support

EXAM:
PORTABLE CHEST 1 VIEW

[chest ap]
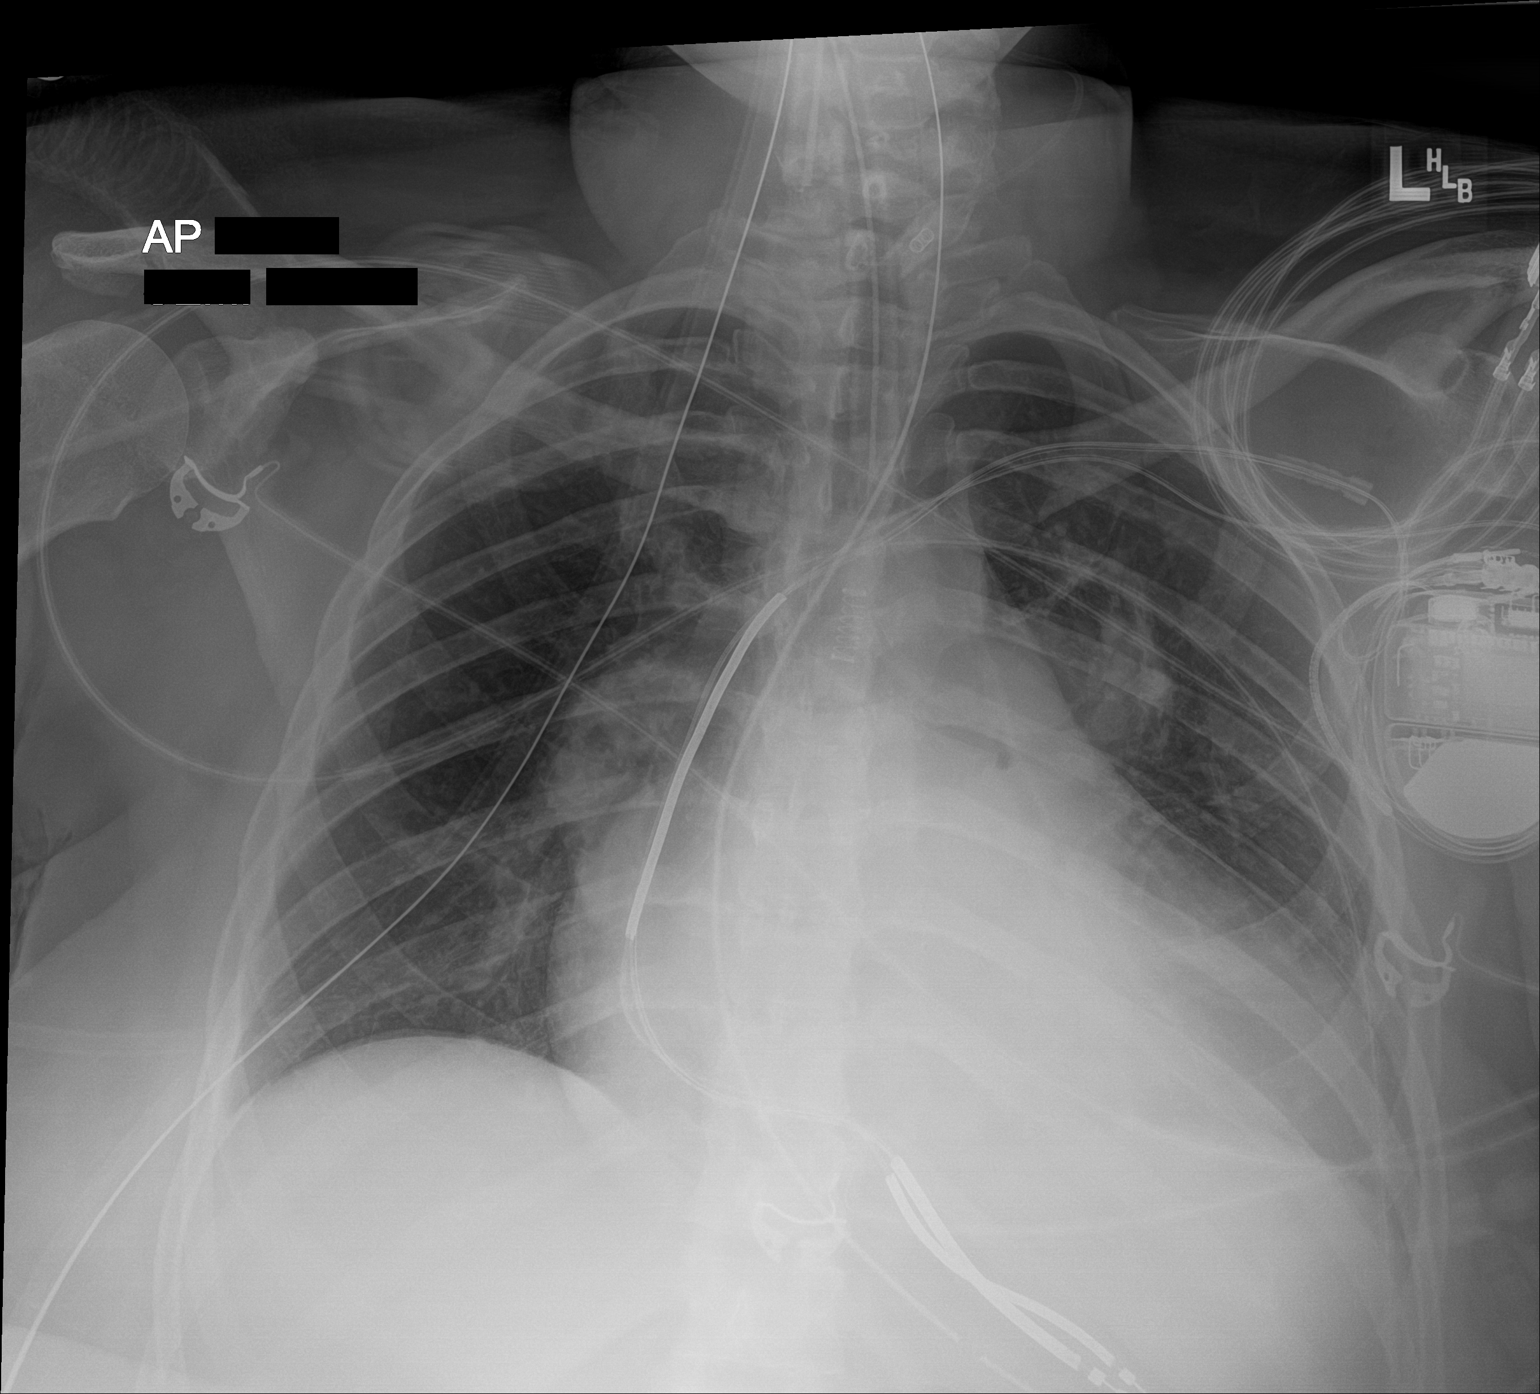

[1 of 1 positions shown; findings below may reference images not displayed]

FINDINGS: Endotracheal tube 4 cm above the carina. Left subclavian
pacemaker/defibrillator noted. NG tube enters the stomach with the
tip not visualized.

Stable cardiomegaly with prominent central vascularity. Dense left
lower lobe collapse/consolidation, slightly worse. Difficult to
exclude developing left effusion. No significant pneumothorax.
IMPRESSION: Increased left lower lobe retrocardiac collapse/consolidation.

Stable cardiomegaly and vascular congestion

## 2021-08-12 ENCOUNTER — Ambulatory Visit: Payer: Medicare Other | Admitting: Adult Health

## 2021-08-19 ENCOUNTER — Ambulatory Visit (INDEPENDENT_AMBULATORY_CARE_PROVIDER_SITE_OTHER): Payer: BC Managed Care – PPO

## 2021-08-19 DIAGNOSIS — Z95810 Presence of automatic (implantable) cardiac defibrillator: Secondary | ICD-10-CM | POA: Diagnosis not present

## 2021-08-19 DIAGNOSIS — I5022 Chronic systolic (congestive) heart failure: Secondary | ICD-10-CM | POA: Diagnosis not present

## 2021-08-20 ENCOUNTER — Telehealth: Payer: Self-pay | Admitting: Internal Medicine

## 2021-08-20 MED ORDER — TORSEMIDE 20 MG PO TABS: ORAL_TABLET | ORAL | 2 refills | Status: DC

## 2021-08-20 NOTE — Progress Notes (Signed)
[place], [place], FNP  Helder Crisafulli Panda, RN She can increase torsemide to 80 mg bid x 3 days, then down to 80/40. She will need to take an extra 40 KCL x 3 days. She will need repeat BMET in 10-14 days.  thanks

## 2021-08-20 NOTE — Progress Notes (Signed)
Spoke with patient and advised Allena Katz, NP recommended to increase torsemide to 80 mg bid x 3 days, then down to 80/40. Also take an extra 40 KCL x 3 days. She will need repeat BMET in 10-14 days.   BMET scheduled for 3/10.  She verbalized understanding of recommendations and will call back if she has any problems or changes in condition.  She has Torsemide supply on hand and will call for refill when needed.

## 2021-08-20 NOTE — Telephone Encounter (Signed)
Per Nira Conn ok to schedule with APP. Scheduled with patient for 3-1 at 1055 cadence

## 2021-08-20 NOTE — Progress Notes (Signed)
EPIC Encounter for ICM Monitoring  Patient Name: [name] is a 51 y.o. female Date: 08/20/2021 Primary Care Physican: Patient, No Pcp Per (Inactive) Primary Cardiologist: [name] Electrophysiologist: [name] 06/11/2021 Weight: 234 lbs 07/09/2021 Weight: 234 lbs 07/30/2021 Weight: 225-227 lbs (baseline since being diuresed) 08/20/2021 Weight: 228 lbs     Spoke with patient and heart failure questions reviewed. Pt reports 2 lb weight gain in last week, sleeping in recliner chair for past 4 days and has a dry cough.  She has been taking Torsemide 80mg /40mg  based off a prescription that was filled prior to 2/8 HF clinic OV at which time the prescription was changed to 80/20 which she did not realize.    Optivol Thoracic impedance suggesting possible fluid accumulation starting 2/13.   Prescribed:  Torsemide 20 mg Take 4 tablets (80 mg total) by mouth daily 20 mg in the afternoon. Potassium 20 mEq Take 2 tablets (40 mEq total) by mouth twice a day  Metolazone 2.5 mg Take 1 tablet (2.5 mg total) by mouth as needed. Take 1 tablet with extra 40 mEq of KCL only when directed by AHF clinic Spironolactone 25 mg take 1 tablet daily.   Farxiga 10 mg take 1 tablet daily.   Labs: 07/22/2021 Creatinine 1.31, BUN 18, Potassium 4.2, Sodium 131, GFR 50 07/21/2021 Creatinine 1.39, BUN 17, Potassium 4.1, Sodium 137, GFR 46  07/20/2021 Creatinine 1.40, BUN 17, Potassium 4.4, Sodium 137, GFR 46  07/19/2021 Creatinine 1.19, BUN 16, Potassium 3.4, Sodium 135, GFR 56  A complete set of results can be found in Results Review.   Recommendations:  Pt is hesitant due to she does not feel fluid symptoms are severe enough to take Metolazone.  She has been taking Torsemide 80/40 due to she did not realize it had been decreased at 2/8 OV to 80/20 dosage.  Will send copy to [facility], HF clinic NP that saw her on 2/8 for review and recommendations.    Will ask Janett Billow for recommendation regarding Torsemide long term  dosage and dose and what should she take for interim due to fluid current fluid symptoms?     Follow-up plan: ICM clinic phone appointment on 08/26/2021 to recheck fluid levels.   91 day device clinic remote transmission 09/16/2021.         EP/Cardiology Office Visits:  09/24/2021 with Dr [name].  Recall 06/23/2021 with Dr [name]   Copy of ICM check sent to Dr. [name].      3 month ICM trend: 08/19/2021.    12-14 Month ICM trend:     [name], RN 08/20/2021 11:04 AM

## 2021-08-20 NOTE — Telephone Encounter (Signed)
Pt c/o swelling: STAT is pt has developed SOB within 24 hours  If swelling, where is the swelling located? abdomen  How much weight have you gained and in what time span? 2 lbs overnight   Have you gained 3 pounds in a day or 5 pounds in a week? no  Do you have a log of your daily weights (if so, list)? No  Are you currently taking a fluid pill? yes  Are you currently SOB? yes not audible gets worse when laying flat  Have you traveled recently? No  Patient monitored from home by laurie short and was advised fluid is high and she is overdue for visit with Dr. [name]. Next available in April .    Please also advise scheduling if given device APP visit ok or if klein can be overbooked.

## 2021-08-21 ENCOUNTER — Ambulatory Visit (INDEPENDENT_AMBULATORY_CARE_PROVIDER_SITE_OTHER): Payer: BC Managed Care – PPO | Admitting: Medical

## 2021-08-21 ENCOUNTER — Encounter: Payer: Self-pay | Admitting: Medical

## 2021-08-21 ENCOUNTER — Other Ambulatory Visit: Payer: Self-pay

## 2021-08-21 ENCOUNTER — Other Ambulatory Visit
Admission: RE | Admit: 2021-08-21 | Discharge: 2021-08-21 | Disposition: A | Discharge status: Home / Self Care | Payer: BC Managed Care – PPO | Attending: Medical | Admitting: Medical

## 2021-08-21 VITALS — BP 90/70 | HR 104 | Ht 63.0 in | Wt 233.1 lb

## 2021-08-21 DIAGNOSIS — I5022 Chronic systolic (congestive) heart failure: Secondary | ICD-10-CM | POA: Insufficient documentation

## 2021-08-21 DIAGNOSIS — Z8673 Personal history of transient ischemic attack (TIA), and cerebral infarction without residual deficits: Secondary | ICD-10-CM

## 2021-08-21 DIAGNOSIS — I472 Ventricular tachycardia, unspecified: Secondary | ICD-10-CM | POA: Diagnosis not present

## 2021-08-21 DIAGNOSIS — I340 Nonrheumatic mitral (valve) insufficiency: Secondary | ICD-10-CM

## 2021-08-21 LAB — BASIC METABOLIC PANEL
Anion gap: 9 (ref 5–15)
BUN: 19 mg/dL (ref 6–20)
CO2: 22 mmol/L (ref 22–32)
Calcium: 9.1 mg/dL (ref 8.9–10.3)
Chloride: 104 mmol/L (ref 98–111)
Creatinine, Ser: 1.3 mg/dL — ABNORMAL HIGH (ref 0.44–1.00)
GFR, Estimated: 50 mL/min — ABNORMAL LOW (ref >60)
Glucose, Bld: 103 mg/dL — ABNORMAL HIGH (ref 70–99)
Potassium: 3.5 mmol/L (ref 3.5–5.1)
Sodium: 135 mmol/L (ref 135–145)

## 2021-08-21 LAB — BRAIN NATRIURETIC PEPTIDE: B Natriuretic Peptide: 957.7 pg/mL — ABNORMAL HIGH (ref 0.0–100.0)

## 2021-08-21 NOTE — Patient Instructions (Signed)
Medication Instructions:  ? ?Your physician recommends that you continue on your current medications as directed. Please refer to the Current Medication list given to you today.  ? ?*If you need a refill on your cardiac medications before your next appointment, please call your pharmacy* ? ? ?Lab Work: ? ?Your provider has ordered labs (BNP, BMET). Please go to the [facility] to have these drawn.  ? ?Nature conservation officer at [facility] ?1st desk on the right to check in, past the screening table ?Lab hours: Monday- Friday (7:30 am- 5:30 pm)  ?If you have labs (blood work) drawn today and your tests are completely normal, you will receive your results only by: ?MyChart Message (if you have MyChart) OR ?A paper copy in the mail ?If you have any lab test that is abnormal or we need to change your treatment, we will call you to review the results. ? ? ?Testing/Procedures: ?None ordered ? ? ?Follow-Up: ?At [facility], you and your health needs are our priority.  As part of our continuing mission to provide you with exceptional heart care, we have created designated Provider Care Teams.  These Care Teams include your primary Cardiologist (physician) and Advanced Practice Providers (APPs -  Physician Assistants and Nurse Practitioners) who all work together to provide you with the care you need, when you need it. ? ?We recommend signing up for the patient portal called "MyChart".  Sign up information is provided on this After Visit Summary.  MyChart is used to connect with patients for Virtual Visits (Telemedicine).  Patients are able to view lab/test results, encounter notes, upcoming appointments, etc.  Non-urgent messages can be sent to your provider as well.   ?To learn more about what you can do with MyChart, go to [email or web address].   ? ?Your next appointment:   ?3 month(s) ? ?The format for your next appointment:   ?In Person ? ?Provider:   ?You may see [name], MD or one of the following Advanced  Practice Providers on your designated Care Team:   ?Murray Hodgkins, NP ?Christell Faith, PA-C ?Cadence Kathlen Mody, PA-C  ? ? ?Other Instructions ?N/A ?

## 2021-08-21 NOTE — Progress Notes (Unsigned)
Cardiology Office Note:    Date:  08/23/2021   ID:  [name], DOB 08-Apr-1971, MRN [id number]  PCP:  Patient, No Pcp Per (Inactive)  [facility] Cardiologist:  [name], MD  [facility] Electrophysiologist:  Virl Axe, MD   Referring MD: No ref. provider found   Chief Complaint: Hospital follow-up  History of Present Illness:    [name] is a 51 y.o. 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   She was followed for systolic CHF initially in [place].  Apparently her EF recovered to 35 to 40%.  She was admitted to [facility] 09/19/[facility] for worsening dyspnea and chest pain.  Coronaries were reportedly okay on left heart cath at that time at [facility].  Echo showed EF of 15 to 20% with four-chamber enlargement with moderate to severe TR/MR.  Right heart cath as below.  Rheumatological work-up was negative.  CPX in 9/16 showed near normal functional capacity.  Repeat echo 617 showed EF 20%.  Repeat CPX in 1/18 showed submaximal, probably mild circulatory limitation.  Echo 8/18 showed EF 25 to 30%, severe LV dilation.  CPX in 6/19 showed low normal functional capacity.  Echo on 1019 showed EF 2025%, moderate LV dilation.   Zio patch in 2/20 showed 11% PVCs and nonsustained VT and amiodarone was started.  Zio patch 320 showed 13.3% PVCs and nonsustained VT.  She saw Dr. [name], he thought the increased PVCs were electromechanical in nature due to worsening heart failure.  Repeat Zio patch on amiodarone in 121 showed 6.2% PVCs.   She was admitted for 20 with VT in the setting of hypokalemia.   CPX in 9/20 showed mild functional limitation, or due to body habitus and heart failure.  Echo 06/27/2019 showed EF 20% with severe LV dilation, diffuse  hypokinesis, normal RV size and systolic function.   In 8/21, she had shocks for VF in the setting of hypokalemia.   Patient was admitted and 10/21 with CVA involving left MCA territory.  This was thought to be cardioembolic with LV thrombus was not visualized and no A. fib has been noted.  Echo 10/21 showed EF 20 to 25%, severe LV dilation, no LV thrombus, mildly decreased RV systolic function, mild to moderate MR.  She was taken by IR for thrombectomy and stent placement, now on aspirin and Brilinta.   She had recurrent VT and 2/22, and quinidine was added by EP.  Spironolactone and Coreg were decreased due to low BP and lightheadedness.   Echo in 4/22 showed EF 25% with severe LV dilation global hypokinesis, normal RV, mild to moderate MR. CPX 8/22 showed mild to moderate heart failure limitation, exercise hypoventilation, restrictive lung physiology from obesity.   Seen 03/26/2021 and was doing better on high-dose torsemide 80 mg in the a.m. 60 mg in the p.m.  Coreg was increased to 6.25 mg twice daily.  Plan for possible heart transplant if patient able to lose weight vs LVAD candidate. She was placed in the baroreceptor activation therapy trial.   The patient called in 1/17 reported worsening shortness fo breath. Torsemide was increased to 80mg  BID. Fluid levels measured 1/23 a week later and said volume was normal.   The patient was admitted 1/17 for shortness of breath.  BNP 1,331. She was treated with IV lasix. Echo showed LVEF 20-25%, severely dilated left ventricle, moderately reduced RV function. Severe MR. She was discharged on torsemide 80mg  in the AM and 40mg  in the pm with metolazone PRN for weight gain.   Saw advanced heart failure team 2/8 and torsemide was decreased to 80/20, metolazone 2.5mg  only as needed.   She called in 07/20/21 for shortness of breath and she was scheduled.   Today, she reports SOB and abdominal swelling for the last 5 days.. Also has some orthopnea that is  new, has a dry cough. Felt OK for a little while, but then swelling will come back. Weight went up to 229lbs, baseline 225-227lbs. She is taking torsemide 80/60 x 3 days, previously on 80-40mg  daily. She feels the more torsemide she takes the less they work. BP low today, 90/70. She does not feel lightheaded or dizzy, today, some yesterday. Bps at home 90/70.    Baroreceptor notes show she was accumulating fluid since 2/13 and since then torsemide has been increased to 80/60x3 days, then back down to 80/40. She feels this is not helping. Weight is mildly above baseline.   Past Medical History:  Diagnosis Date   Automatic implantable cardioverter-defibrillator in situ    Chronic CHF (congestive heart failure) ([place])    a. EF 15-20% b. RHC (09/2013) RA 14, RV 57/22, PA 64/36 (48), PCWP 18, FIck CO/CI 3.7/1.6, PVR 8.1 WU, PA sat 47%    History of stomach ulcers    Hypertension    Hypotension    Hypothyroidism    Morbid obesity ([place])    Myocardial infarction ([place]) 08/2013   Nocturnal dyspnea    Nonischemic cardiomyopathy (H[facility])    Sinus tachycardia    Sleep apnea    Snoring-prob OSA 09/04/2011   Sprint Fidelis ICD lead RECALL  6949    UARS (upper airway resistance syndrome) 09/04/2011   HST 12/2013:  AHI 4/hr (numerous episodes of airflow reduction that did not have concomitant desaturation)     Past Surgical History:  Procedure Laterality Date   BREATH TEK H PYLORI N/A 11/09/2014   Procedure: BREATH TEK H PYLORI;  Surgeon: Greer Pickerel, MD;  Location: [facility];  Service: General;  Laterality: N/A;   CARDIAC CATHETERIZATION  ~ 2006; 09/2013   CARDIAC CATHETERIZATION N/A 05/23/2016   Procedure: Right Heart Cath;  Surgeon: Larey Dresser, MD;  Location: [facility];  Service: Cardiovascular;  Laterality: N/A;   CARDIAC DEFIBRILLATOR PLACEMENT  2006; 12/26/2013   Medtronic [id number]; 12-2013 ICD gen change and RV lead revision with new 6935 RV lead by Dr [name]   CESAREAN SECTION  1999    IMPLANTABLE CARDIOVERTER DEFIBRILLATOR GENERATOR CHANGE N/A 12/26/2013   Procedure: IMPLANTABLE CARDIOVERTER DEFIBRILLATOR GENERATOR CHANGE;  Surgeon: Deboraha Sprang, MD;  Location: [facility] CATH LAB;  Service: Cardiovascular;  Laterality: N/A;   IR CT HEAD LTD  04/17/2020   IR CT HEAD LTD  04/17/2020   IR INTRA CRAN STENT  04/17/2020   IR PERCUTANEOUS ART THROMBECTOMY/INFUSION INTRACRANIAL INC DIAG ANGIO  04/17/2020   IR RADIOLOGIST EVAL & MGMT  06/05/2020   LEAD REVISION N/A 12/26/2013   Procedure: LEAD REVISION;  Surgeon: Deboraha Sprang, MD;  Location: [facility] CATH LAB;  Service: Cardiovascular;  Laterality: N/A;   RADIOLOGY WITH ANESTHESIA N/A 04/17/2020   Procedure: Code Stroke;  Surgeon: Luanne Bras, MD;  Location: [facility];  Service: Radiology;  Laterality: N/A;   RIGHT HEART CATHETERIZATION N/A 02/15/2014  Procedure: RIGHT HEART CATH;  Surgeon: Larey Dresser, MD;  Location[facility] C[facility] LAB;  Service: Cardiovascular;  Laterality: N/A;   TUBAL LIGATION  1999    Current Medications: Current Meds  Medication Sig   amiodarone (PACERONE) 200 MG tablet TAKE 1 TABLET BY MOUTH ONCE DAILY   ASPIRIN LOW DOSE 81 MG EC tablet TAKE 1 TABLET BY MOUTH ONCE A DAY   BRILINTA 90 MG TABS tablet TAKE 1 TABLET BY MOUTH TWICE A DAY   carvedilol (COREG) 3.125 MG tablet Take 1 tablet (3.125 mg total) by mouth 2 (two) times daily with a meal.   ENTRESTO 24-26 MG TAKE 1 TABLET BY MOUTH TWICE A DAY   FARXIGA 10 MG TABS tablet TAKE 1 TABLET BY MOUTH ONCE A DAY BEFOREBREAKFAST   levothyroxine (SYNTHROID) 25 MCG tablet Take 1 tablet (25 mcg total) by mouth daily before breakfast.   potassium chloride SA (KLOR-CON M) 20 MEQ tablet Take 40 mEq by mouth 2 (two) times daily.   quiniDINE gluconate 324 MG CR tablet TAKE 1 TABLET BY MOUTH TWICE A DAY   rosuvastatin (CRESTOR) 40 MG tablet Take 1 tablet (40 mg total) by mouth daily.   spironolactone (ALDACTONE) 25 MG tablet TAKE 1 TABLET BY MOUTH EVERY NIGHT AT BEDTIME    torsemide (DEMADEX) 20 MG tablet Take 4 tablets (80 mg total) by mouth every morning AND 2 tablets (40 mg total) every evening. (Patient taking differently: Take 4 tablets (80 mg total) by mouth every morning AND 3 tablets (40 mg total) every evening.)     Allergies:   Patient has no known allergies.   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  Tobacco Use   Smoking status: Never   Smokeless tobacco: Never  Vaping Use   Vaping Use: Never used  Substance and Sexual Activity   Alcohol use: No   Drug use: No   Sexual activity: Yes  Other Topics Concern   Not on file  Social History Narrative   Soda sometimes   Right handed   Social Determinants of Health   Financial Resource Strain: Not on file  Food Insecurity: Not on file  Transportation Needs: Not on file  Physical Activity: Not on file  Stress: Not on file  Social Connections: Not on file     Family History: The patient's family history includes Heart disease in her maternal grandmother and mother; High blood pressure in her father; Obesity in her mother; Stroke in her father.  ROS:   Please see the history of present illness.     All other systems reviewed and are negative.  EKGs/Labs/Other Studies Reviewed:    The following studies were reviewed today: ***  EKG:  EKG is *** ordered today.  The ekg ordered today demonstrates ***  Recent Labs: 07/18/2021: TSH 2.512 07/22/2021: ALT 34 07/31/2021: Hemoglobin 11.3; Magnesium 2.1; Platelets 390 08/21/2021: B Natriuretic Peptide 957.7; BUN 19; Creatinine, Ser 1.30; Potassium 3.5; Sodium 135  Recent Lipid Panel    Component Value Date/Time   CHOL 196 04/29/2021 0842   CHOL 183 09/20/2013 0424   TRIG 85 04/29/2021 0842   TRIG 105 09/20/2013 0424    HDL 62 04/29/2021 0842   HDL 40 09/20/2013 0424  CHOLHDL 3.2 04/29/2021 0842   CHOLHDL 4.0 04/18/2020 0950   VLDL 18 04/18/2020 0950   VLDL 21 09/20/2013 0424   LDLCALC 119 (H) 04/29/2021 0842   LDLCALC 122 (H) 09/20/2013 0424    Physical Exam:    VS:  BP 90/70 (BP Location: Left Arm, Patient Position: Sitting, Cuff Size: Large)    Pulse (!) 104    Ht 5\' 3"  (1.6 m)    Wt 233 lb 2 oz (105.7 kg)    SpO2 98%    BMI 41.30 kg/m     Wt Readings from Last 3 Encounters:  08/21/21 233 lb 2 oz (105.7 kg)  07/31/21 230 lb 3.2 oz (104.4 kg)  07/22/21 233 lb 14.4 oz (106.1 kg)     GEN:  Well nourished, well developed in no acute distress HEENT: Normal NECK: + JVD; No carotid bruits LYMPHATICS: No lymphadenopathy CARDIAC: RRR, no murmurs, rubs, gallops RESPIRATORY:  Clear to auscultation without rales, wheezing or rhonchi  ABDOMEN: Soft, non-tender, non-distended MUSCULOSKELETAL:  No edema; No deformity  SKIN: Warm and dry NEUROLOGIC:  Alert and oriented x 3 PSYCHIATRIC:  Normal affect   ASSESSMENT:    1. Chronic systolic CHF (congestive heart failure) ([place])   2. Chronic systolic heart failure ([place])   3. VT (ventricular tachycardia)   4. History of CVA (cerebrovascular accident)   5. Severe mitral regurgitation    PLAN:    In order of problems listed above:  HFrEF NICM s/p ICD BP low, continue current medications for now given she is asymptomatic. If symptomatic can hold Entresto. Needs to follow-up with advanced heart failure clinic. Also she is in the baroreceptor study and already has instructions regarding torsemide, she is taking 80/60x3 days, then going back down to 80/40mg  daily. She feels no difference. Lungs clear, some mild JVD, no LLE. BNP and BMET today. She may need to take metolazone 2.5 x1 based off labs and symptoms.I will contact the advanced heart failure team for their recommendations. BP soft, but patient is asymptomatic. Continue   H/o VT/VF Continue  amiodarone, followed by EP  Severe MR Recent limited echo showed progression of MR to severe with MV peak gradient, 7.1  mmHg. The mean mitral valve gradient is 3.0 mmHg. May need to see structural heart team.   H/o CVA s/p thrombectomy and stenting Continue Brilinta and Crestor.   Disposition: Follow up with Advanced heart failure team    Signed, Giulian Goldring Ninfa Meeker, PA-C  08/23/2021 10:55 AM    [place]

## 2021-08-22 ENCOUNTER — Telehealth: Payer: Self-pay | Admitting: Emergency Medicine

## 2021-08-22 NOTE — Telephone Encounter (Signed)
-----   Message from [place], PA-C sent at 08/21/2021  4:38 PM EST ----- ?Regarding: FW: CHF patient ?This was the response from CHF clinic. Can we call the patient and instruct her to take metolazone 2.5mg  once with potassium 40KCL. Also to let her know about the remote transmission check 3/6 with subsequent recommendations from the clinic. Also to let her know she can get into the heart failure clinic sooner if necessary.  ? ?----- Message ----- ?From: Rafael Bihari, FNP ?Sent: 08/21/2021   1:55 PM EST ?To: Cadence Ninfa Meeker, PA-C ?Subject: RE: CHF patient                               ? ?Hey Cadence, thanks for reaching out. If she is still feeling poorly, she can definitely take metolazone 2.5 with extra 40 KCL. I think when our office touched base last week, she did not feel bad enough to do this. She also has anemia, which I think contributes to her symptoms. Looks like Sharman Cheek is going to do a remote transmission on 3/6 and I can follow her response to the metolazone with that. If she does not feel better after the metolazone dose, we will be happy to get her in sooner than her follow up. ? ?-Janett Billow ?----- Message ----- ?From: Kathlen Mody, Cadence H, PA-C ?Sent: 08/21/2021   1:43 PM EST ?To: Rafael Bihari, FNP ?Subject: CHF patient                                   ? ?This patient called in the office 2 days ago complaining of SOB and she was put on my schedule, however it looks like she regularly follows with advanced heart failure team, and was given instructions already to increase lasix to 80 am/60pm for 3 days. Weight is mildly above baseline 229lbs, baseline 225-227lbs. BNP came back at 950, stable Scr/BUN. They have follow-up next month with heart failure team.  The husband was VERY concerned. I was wondering if you had a plan to see her sooner rather than later?Any recommendations welcome, I was thinking she may need metolazone x1 if she is not feeling better. ?Thanks! ? ? ? ?

## 2021-08-22 NOTE — Telephone Encounter (Signed)
Spoke with patient. Went over recommendations from CHF clinic and explained to patient that should she not feel better after metolazone, she should contact CHF clinic to schedule an appointment for sooner than her scheduled follow up. Pt verbalized understanding and voiced appreciation for the call.  ?

## 2021-08-25 ENCOUNTER — Inpatient Hospital Stay (HOSPITAL_COMMUNITY)
Admission: EM | Admit: 2021-08-25 | Discharge: 2021-09-20 | DRG: 001 | Disposition: A | Discharge status: Rehabilitation Facility | Payer: BC Managed Care – PPO | Attending: Cardiovascular Disease | Admitting: Cardiovascular Disease

## 2021-08-25 ENCOUNTER — Emergency Department (HOSPITAL_COMMUNITY): Payer: BC Managed Care – PPO

## 2021-08-25 DIAGNOSIS — J95821 Acute postprocedural respiratory failure: Secondary | ICD-10-CM | POA: Diagnosis not present

## 2021-08-25 DIAGNOSIS — Z7902 Long term (current) use of antithrombotics/antiplatelets: Secondary | ICD-10-CM

## 2021-08-25 DIAGNOSIS — I493 Ventricular premature depolarization: Secondary | ICD-10-CM | POA: Diagnosis not present

## 2021-08-25 DIAGNOSIS — Z8249 Family history of ischemic heart disease and other diseases of the circulatory system: Secondary | ICD-10-CM

## 2021-08-25 DIAGNOSIS — I252 Old myocardial infarction: Secondary | ICD-10-CM

## 2021-08-25 DIAGNOSIS — E785 Hyperlipidemia, unspecified: Secondary | ICD-10-CM | POA: Diagnosis present

## 2021-08-25 DIAGNOSIS — E0590 Thyrotoxicosis, unspecified without thyrotoxic crisis or storm: Secondary | ICD-10-CM | POA: Diagnosis not present

## 2021-08-25 DIAGNOSIS — R0602 Shortness of breath: Secondary | ICD-10-CM | POA: Diagnosis not present

## 2021-08-25 DIAGNOSIS — I2721 Secondary pulmonary arterial hypertension: Secondary | ICD-10-CM | POA: Diagnosis present

## 2021-08-25 DIAGNOSIS — Z95811 Presence of heart assist device: Secondary | ICD-10-CM

## 2021-08-25 DIAGNOSIS — Z01818 Encounter for other preprocedural examination: Secondary | ICD-10-CM

## 2021-08-25 DIAGNOSIS — I5021 Acute systolic (congestive) heart failure: Secondary | ICD-10-CM | POA: Diagnosis not present

## 2021-08-25 DIAGNOSIS — Z7982 Long term (current) use of aspirin: Secondary | ICD-10-CM

## 2021-08-25 DIAGNOSIS — I340 Nonrheumatic mitral (valve) insufficiency: Secondary | ICD-10-CM | POA: Diagnosis present

## 2021-08-25 DIAGNOSIS — Z452 Encounter for adjustment and management of vascular access device: Secondary | ICD-10-CM | POA: Diagnosis not present

## 2021-08-25 DIAGNOSIS — D689 Coagulation defect, unspecified: Secondary | ICD-10-CM | POA: Diagnosis not present

## 2021-08-25 DIAGNOSIS — T380X5A Adverse effect of glucocorticoids and synthetic analogues, initial encounter: Secondary | ICD-10-CM | POA: Diagnosis not present

## 2021-08-25 DIAGNOSIS — E440 Moderate protein-calorie malnutrition: Secondary | ICD-10-CM | POA: Insufficient documentation

## 2021-08-25 DIAGNOSIS — Z79899 Other long term (current) drug therapy: Secondary | ICD-10-CM

## 2021-08-25 DIAGNOSIS — Z79890 Hormone replacement therapy (postmenopausal): Secondary | ICD-10-CM

## 2021-08-25 DIAGNOSIS — R509 Fever, unspecified: Secondary | ICD-10-CM | POA: Diagnosis not present

## 2021-08-25 DIAGNOSIS — N920 Excessive and frequent menstruation with regular cycle: Secondary | ICD-10-CM | POA: Diagnosis present

## 2021-08-25 DIAGNOSIS — Z6841 Body Mass Index (BMI) 40.0 and over, adult: Secondary | ICD-10-CM | POA: Diagnosis not present

## 2021-08-25 DIAGNOSIS — D509 Iron deficiency anemia, unspecified: Secondary | ICD-10-CM | POA: Diagnosis present

## 2021-08-25 DIAGNOSIS — Z95828 Presence of other vascular implants and grafts: Secondary | ICD-10-CM | POA: Diagnosis not present

## 2021-08-25 DIAGNOSIS — E876 Hypokalemia: Secondary | ICD-10-CM | POA: Diagnosis not present

## 2021-08-25 DIAGNOSIS — R531 Weakness: Secondary | ICD-10-CM | POA: Diagnosis not present

## 2021-08-25 DIAGNOSIS — E039 Hypothyroidism, unspecified: Secondary | ICD-10-CM | POA: Diagnosis not present

## 2021-08-25 DIAGNOSIS — I959 Hypotension, unspecified: Secondary | ICD-10-CM | POA: Diagnosis not present

## 2021-08-25 DIAGNOSIS — I428 Other cardiomyopathies: Secondary | ICD-10-CM | POA: Diagnosis not present

## 2021-08-25 DIAGNOSIS — N179 Acute kidney failure, unspecified: Secondary | ICD-10-CM | POA: Diagnosis not present

## 2021-08-25 DIAGNOSIS — N200 Calculus of kidney: Secondary | ICD-10-CM | POA: Diagnosis not present

## 2021-08-25 DIAGNOSIS — E43 Unspecified severe protein-calorie malnutrition: Secondary | ICD-10-CM | POA: Diagnosis present

## 2021-08-25 DIAGNOSIS — I5032 Chronic diastolic (congestive) heart failure: Secondary | ICD-10-CM | POA: Diagnosis not present

## 2021-08-25 DIAGNOSIS — R638 Other symptoms and signs concerning food and fluid intake: Secondary | ICD-10-CM | POA: Diagnosis not present

## 2021-08-25 DIAGNOSIS — R000 Tachycardia, unspecified: Secondary | ICD-10-CM | POA: Diagnosis not present

## 2021-08-25 DIAGNOSIS — N189 Chronic kidney disease, unspecified: Secondary | ICD-10-CM | POA: Diagnosis not present

## 2021-08-25 DIAGNOSIS — I69351 Hemiplegia and hemiparesis following cerebral infarction affecting right dominant side: Secondary | ICD-10-CM | POA: Diagnosis not present

## 2021-08-25 DIAGNOSIS — Z515 Encounter for palliative care: Secondary | ICD-10-CM | POA: Diagnosis not present

## 2021-08-25 DIAGNOSIS — D696 Thrombocytopenia, unspecified: Secondary | ICD-10-CM | POA: Diagnosis not present

## 2021-08-25 DIAGNOSIS — N838 Other noninflammatory disorders of ovary, fallopian tube and broad ligament: Secondary | ICD-10-CM

## 2021-08-25 DIAGNOSIS — Z20822 Contact with and (suspected) exposure to covid-19: Secondary | ICD-10-CM | POA: Diagnosis not present

## 2021-08-25 DIAGNOSIS — I081 Rheumatic disorders of both mitral and tricuspid valves: Secondary | ICD-10-CM | POA: Diagnosis not present

## 2021-08-25 DIAGNOSIS — I130 Hypertensive heart and chronic kidney disease with heart failure and stage 1 through stage 4 chronic kidney disease, or unspecified chronic kidney disease: Principal | ICD-10-CM | POA: Diagnosis present

## 2021-08-25 DIAGNOSIS — I5023 Acute on chronic systolic (congestive) heart failure: Secondary | ICD-10-CM | POA: Diagnosis present

## 2021-08-25 DIAGNOSIS — Z7189 Other specified counseling: Secondary | ICD-10-CM | POA: Diagnosis not present

## 2021-08-25 DIAGNOSIS — N1831 Chronic kidney disease, stage 3a: Secondary | ICD-10-CM | POA: Diagnosis not present

## 2021-08-25 DIAGNOSIS — I517 Cardiomegaly: Secondary | ICD-10-CM | POA: Diagnosis not present

## 2021-08-25 DIAGNOSIS — Z95810 Presence of automatic (implantable) cardiac defibrillator: Secondary | ICD-10-CM | POA: Diagnosis not present

## 2021-08-25 DIAGNOSIS — T462X5A Adverse effect of other antidysrhythmic drugs, initial encounter: Secondary | ICD-10-CM | POA: Diagnosis present

## 2021-08-25 DIAGNOSIS — D72829 Elevated white blood cell count, unspecified: Secondary | ICD-10-CM | POA: Diagnosis not present

## 2021-08-25 DIAGNOSIS — R5381 Other malaise: Secondary | ICD-10-CM | POA: Diagnosis not present

## 2021-08-25 DIAGNOSIS — E119 Type 2 diabetes mellitus without complications: Secondary | ICD-10-CM | POA: Diagnosis present

## 2021-08-25 DIAGNOSIS — I5084 End stage heart failure: Secondary | ICD-10-CM | POA: Diagnosis present

## 2021-08-25 DIAGNOSIS — N183 Chronic kidney disease, stage 3 unspecified: Secondary | ICD-10-CM | POA: Diagnosis not present

## 2021-08-25 DIAGNOSIS — I480 Paroxysmal atrial fibrillation: Secondary | ICD-10-CM | POA: Diagnosis not present

## 2021-08-25 DIAGNOSIS — R002 Palpitations: Secondary | ICD-10-CM | POA: Diagnosis not present

## 2021-08-25 DIAGNOSIS — Z9911 Dependence on respirator [ventilator] status: Secondary | ICD-10-CM

## 2021-08-25 DIAGNOSIS — D62 Acute posthemorrhagic anemia: Secondary | ICD-10-CM | POA: Diagnosis not present

## 2021-08-25 DIAGNOSIS — I447 Left bundle-branch block, unspecified: Secondary | ICD-10-CM | POA: Diagnosis present

## 2021-08-25 DIAGNOSIS — N1832 Chronic kidney disease, stage 3b: Secondary | ICD-10-CM | POA: Diagnosis not present

## 2021-08-25 DIAGNOSIS — J9811 Atelectasis: Secondary | ICD-10-CM | POA: Diagnosis not present

## 2021-08-25 DIAGNOSIS — R079 Chest pain, unspecified: Secondary | ICD-10-CM | POA: Diagnosis not present

## 2021-08-25 DIAGNOSIS — N83202 Unspecified ovarian cyst, left side: Secondary | ICD-10-CM | POA: Diagnosis present

## 2021-08-25 DIAGNOSIS — G4733 Obstructive sleep apnea (adult) (pediatric): Secondary | ICD-10-CM | POA: Diagnosis not present

## 2021-08-25 DIAGNOSIS — N182 Chronic kidney disease, stage 2 (mild): Secondary | ICD-10-CM | POA: Diagnosis not present

## 2021-08-25 DIAGNOSIS — Z8711 Personal history of peptic ulcer disease: Secondary | ICD-10-CM

## 2021-08-25 DIAGNOSIS — J939 Pneumothorax, unspecified: Secondary | ICD-10-CM

## 2021-08-25 DIAGNOSIS — I5043 Acute on chronic combined systolic (congestive) and diastolic (congestive) heart failure: Secondary | ICD-10-CM | POA: Diagnosis not present

## 2021-08-25 DIAGNOSIS — I472 Ventricular tachycardia, unspecified: Secondary | ICD-10-CM | POA: Diagnosis present

## 2021-08-25 DIAGNOSIS — R570 Cardiogenic shock: Secondary | ICD-10-CM | POA: Diagnosis present

## 2021-08-25 DIAGNOSIS — R7401 Elevation of levels of liver transaminase levels: Secondary | ICD-10-CM | POA: Diagnosis not present

## 2021-08-25 DIAGNOSIS — Z823 Family history of stroke: Secondary | ICD-10-CM

## 2021-08-25 DIAGNOSIS — Z8673 Personal history of transient ischemic attack (TIA), and cerebral infarction without residual deficits: Secondary | ICD-10-CM

## 2021-08-25 DIAGNOSIS — I10 Essential (primary) hypertension: Secondary | ICD-10-CM | POA: Diagnosis not present

## 2021-08-25 DIAGNOSIS — J90 Pleural effusion, not elsewhere classified: Secondary | ICD-10-CM | POA: Diagnosis not present

## 2021-08-25 DIAGNOSIS — E46 Unspecified protein-calorie malnutrition: Secondary | ICD-10-CM | POA: Diagnosis not present

## 2021-08-25 DIAGNOSIS — Z4682 Encounter for fitting and adjustment of non-vascular catheter: Secondary | ICD-10-CM | POA: Diagnosis not present

## 2021-08-25 DIAGNOSIS — S40011A Contusion of right shoulder, initial encounter: Secondary | ICD-10-CM | POA: Diagnosis present

## 2021-08-25 DIAGNOSIS — I509 Heart failure, unspecified: Secondary | ICD-10-CM

## 2021-08-25 DIAGNOSIS — E0580 Other thyrotoxicosis without thyrotoxic crisis or storm: Secondary | ICD-10-CM | POA: Diagnosis present

## 2021-08-25 DIAGNOSIS — Z4659 Encounter for fitting and adjustment of other gastrointestinal appliance and device: Secondary | ICD-10-CM

## 2021-08-25 DIAGNOSIS — R918 Other nonspecific abnormal finding of lung field: Secondary | ICD-10-CM | POA: Diagnosis not present

## 2021-08-25 DIAGNOSIS — I4891 Unspecified atrial fibrillation: Secondary | ICD-10-CM | POA: Diagnosis not present

## 2021-08-25 DIAGNOSIS — T502X5A Adverse effect of carbonic-anhydrase inhibitors, benzothiadiazides and other diuretics, initial encounter: Secondary | ICD-10-CM | POA: Diagnosis present

## 2021-08-25 DIAGNOSIS — K5900 Constipation, unspecified: Secondary | ICD-10-CM | POA: Diagnosis not present

## 2021-08-25 DIAGNOSIS — E041 Nontoxic single thyroid nodule: Secondary | ICD-10-CM | POA: Diagnosis not present

## 2021-08-25 DIAGNOSIS — Z01810 Encounter for preprocedural cardiovascular examination: Secondary | ICD-10-CM | POA: Diagnosis not present

## 2021-08-25 LAB — BASIC METABOLIC PANEL
Anion gap: 11 (ref 5–15)
BUN: 20 mg/dL (ref 6–20)
CO2: 19 mmol/L — ABNORMAL LOW (ref 22–32)
Calcium: 9.3 mg/dL (ref 8.9–10.3)
Chloride: 105 mmol/L (ref 98–111)
Creatinine, Ser: 1.5 mg/dL — ABNORMAL HIGH (ref 0.44–1.00)
GFR, Estimated: 42 mL/min — ABNORMAL LOW (ref >60)
Glucose, Bld: 115 mg/dL — ABNORMAL HIGH (ref 70–99)
Potassium: 4 mmol/L (ref 3.5–5.1)
Sodium: 135 mmol/L (ref 135–145)

## 2021-08-25 LAB — HEPATIC FUNCTION PANEL
ALT: 38 U/L (ref 0–44)
AST: 29 U/L (ref 15–41)
Albumin: 3.1 g/dL — ABNORMAL LOW (ref 3.5–5.0)
Alkaline Phosphatase: 86 U/L (ref 38–126)
Bilirubin, Direct: 0.1 mg/dL (ref 0.0–0.2)
Indirect Bilirubin: 0.6 mg/dL (ref 0.3–0.9)
Total Bilirubin: 0.7 mg/dL (ref 0.3–1.2)
Total Protein: 6.6 g/dL (ref 6.5–8.1)

## 2021-08-25 LAB — RESP PANEL BY RT-PCR (FLU A&B, COVID) ARPGX2
Influenza A by PCR: NEGATIVE
Influenza B by PCR: NEGATIVE
SARS Coronavirus 2 by RT PCR: NEGATIVE

## 2021-08-25 LAB — I-STAT VENOUS BLOOD GAS, ED
Acid-base deficit: 1 mmol/L (ref 0.0–2.0)
Bicarbonate: 21.4 mmol/L (ref 20.0–28.0)
Calcium, Ion: 1.19 mmol/L (ref 1.15–1.40)
HCT: 33 % — ABNORMAL LOW (ref 36.0–46.0)
Hemoglobin: 11.2 g/dL — ABNORMAL LOW (ref 12.0–15.0)
O2 Saturation: 89 %
Potassium: 3.9 mmol/L (ref 3.5–5.1)
Sodium: 138 mmol/L (ref 135–145)
TCO2: 22 mmol/L (ref 22–32)
pCO2, Ven: 29.2 mmHg — ABNORMAL LOW (ref 44–60)
pH, Ven: 7.472 — ABNORMAL HIGH (ref 7.25–7.43)
pO2, Ven: 51 mmHg — ABNORMAL HIGH (ref 32–45)

## 2021-08-25 LAB — TROPONIN I (HIGH SENSITIVITY)
Troponin I (High Sensitivity): 27 ng/L — ABNORMAL HIGH (ref <18)
Troponin I (High Sensitivity): 29 ng/L — ABNORMAL HIGH (ref <18)

## 2021-08-25 LAB — CBC
HCT: 37.9 % (ref 36.0–46.0)
Hemoglobin: 11.4 g/dL — ABNORMAL LOW (ref 12.0–15.0)
MCH: 22.1 pg — ABNORMAL LOW (ref 26.0–34.0)
MCHC: 30.1 g/dL (ref 30.0–36.0)
MCV: 73.6 fL — ABNORMAL LOW (ref 80.0–100.0)
Platelets: 401 10*3/uL — ABNORMAL HIGH (ref 150–400)
RBC: 5.15 MIL/uL — ABNORMAL HIGH (ref 3.87–5.11)
RDW: 17.4 % — ABNORMAL HIGH (ref 11.5–15.5)
WBC: 10.1 10*3/uL (ref 4.0–10.5)
nRBC: 0.4 % — ABNORMAL HIGH (ref 0.0–0.2)

## 2021-08-25 LAB — MAGNESIUM: Magnesium: 2 mg/dL (ref 1.7–2.4)

## 2021-08-25 LAB — LACTIC ACID, PLASMA: Lactic Acid, Venous: 1.9 mmol/L (ref 0.5–1.9)

## 2021-08-25 LAB — I-STAT BETA HCG BLOOD, ED (MC, WL, AP ONLY): I-stat hCG, quantitative: <5 m[IU]/mL (ref <5)

## 2021-08-25 LAB — BRAIN NATRIURETIC PEPTIDE: B Natriuretic Peptide: 1117 pg/mL — ABNORMAL HIGH (ref 0.0–100.0)

## 2021-08-25 LAB — D-DIMER, QUANTITATIVE: D-Dimer, Quant: 1.32 ug/mL-FEU — ABNORMAL HIGH (ref 0.00–0.50)

## 2021-08-25 MED ORDER — IOHEXOL 350 MG/ML SOLN
63.0000 mL | Freq: Once | INTRAVENOUS | Status: AC | PRN
  Administered 2021-08-25: 63 mL via INTRAVENOUS

## 2021-08-25 MED ORDER — SODIUM CHLORIDE 0.9 % IV BOLUS
250.0000 mL | Freq: Once | INTRAVENOUS | Status: AC
  Administered 2021-08-25: 250 mL via INTRAVENOUS

## 2021-08-25 NOTE — ED Triage Notes (Signed)
Pt c/o [facility] & "feeling unwell," palpitations, poor PO intake x3 days. Today, emesis after eating a banana. Hx CHF, ICD implant, hypotension ?

## 2021-08-25 NOTE — ED Provider Notes (Signed)
[facility]  Provider Note  CSN: [id number] Arrival date & time: 08/25/21 1703  History Chief Complaint  Patient presents with   Shortness of Breath   Emesis   Palpitations         [name] is a 51 y.o. female who presented with complaints of shortness of breath and feeling unwell.  She states that she has been feeling unwell having palpitations and having poor p.o. today for last 3 days. She did have one episode of vomiting today.  Patient states that she normally takes torsemide for her CHF, but she also has metolazone as needed.  This week she was feeling particularly volume overloaded.  Her BNP was checked and was found to be in the 950s.  She took metolazone in addition to her torsemide.  She is not concerned that she may have over diuresed herself.  She feels dry.  Her symptoms started after this diuresis.   Home Medications Prior to Admission medications   Medication Sig Start Date End Date Taking? Authorizing Provider  acetaminophen (TYLENOL) 500 MG tablet Take 500 mg by mouth every 6 (six) hours as needed for moderate pain or headache.   Yes [provider]  amiodarone (PACERONE) 200 MG tablet TAKE 1 TABLET BY MOUTH ONCE DAILY Patient taking differently: Take 200 mg by mouth daily. 03/21/21  Yes Deboraha Sprang, MD  ASPIRIN LOW DOSE 81 MG EC tablet TAKE 1 TABLET BY MOUTH ONCE A DAY Patient taking differently: Take 81 mg by mouth daily. 05/02/21  Yes Larey Dresser, MD  BRILINTA 90 MG TABS tablet TAKE 1 TABLET BY MOUTH TWICE A DAY Patient taking differently: Take 90 mg by mouth 2 (two) times daily. 05/02/21  Yes Larey Dresser, MD  carvedilol (COREG) 3.125 MG tablet Take 1 tablet (3.125 mg total) by mouth 2 (two) times daily with a meal. 07/22/21  Yes Sheikh, Omair Latif, DO  ENTRESTO 24-26 MG TAKE 1 TABLET BY MOUTH TWICE A DAY Patient taking differently: Take 1 tablet by mouth 2 (two) times daily. 05/02/21  Yes Larey Dresser, MD  FARXIGA 10 MG TABS tablet TAKE 1 TABLET BY MOUTH ONCE A DAY BEFOREBREAKFAST Patient taking differently: Take 10 mg by mouth daily. 02/07/21  Yes Larey Dresser, MD  levothyroxine (SYNTHROID) 25 MCG tablet Take 1 tablet (25 mcg total) by mouth daily before breakfast. 09/23/18  Yes Deboraha Sprang, MD  metolazone (ZAROXOLYN) 2.5 MG tablet Take 1 tablet (2.5 mg total) by mouth as needed. Take 1 tablet with extra 40 mEq of KCL only when directed by [facility] clinic Patient taking differently: Take 2.5 mg by mouth daily as needed (fluid when directed by doctor). 07/31/21  Yes [place], [name] Bo, FNP  potassium chloride SA (KLOR-CON M) 20 MEQ tablet Take 40 mEq by mouth 2 (two) times daily.   Yes [provider]  quiniDINE gluconate 324 MG CR tablet TAKE 1 TABLET BY MOUTH TWICE A DAY Patient taking differently: Take 324 mg by mouth 2 (two) times daily. 07/31/21  Yes Deboraha Sprang, MD  rosuvastatin (CRESTOR) 40 MG tablet Take 1 tablet (40 mg total) by mouth daily. Patient taking differently: Take 40 mg by mouth at bedtime. 05/01/21  Yes McCue, Janett Billow, NP  spironolactone (ALDACTONE) 25 MG tablet TAKE 1 TABLET BY MOUTH EVERY NIGHT AT BEDTIME Patient taking differently: Take 25 mg by mouth at bedtime. 10/26/20  Yes Larey Dresser, MD  torsemide [facility]) 20  MG tablet Take 4 tablets (80 mg total) by mouth every morning AND 2 tablets (40 mg total) every evening. Patient taking differently: Take 4 tablets (80 mg total) by mouth every morning AND 3 tablets (40 mg total) every evening. 08/20/21 11/18/21 Yes Brandon, [name] Bo, FNP     Allergies    Patient has no known allergies.   Review of Systems   Review of Systems  Constitutional:  Positive for fatigue. Negative for chills and fever.  HENT:  Negative for ear pain and sore throat.   Eyes:  Negative for pain and visual disturbance.  Respiratory:  Positive for shortness of breath. Negative for cough.   Cardiovascular:  Negative for chest  pain, palpitations and leg swelling.  Gastrointestinal:  Positive for vomiting. Negative for abdominal pain.  Genitourinary:  Negative for dysuria and hematuria.  Musculoskeletal:  Negative for arthralgias and back pain.  Skin:  Negative for color change and rash.  Neurological:  Negative for seizures and syncope.  All other systems reviewed and are negative. Please see HPI for pertinent positives and negatives  Physical Exam BP 96/76    Pulse (!) 123    Temp 98.1 F (36.7 C) (Oral)    Resp (!) 25    Wt 104.5 kg    SpO2 99%    BMI 40.81 kg/m   Physical Exam Vitals and nursing note reviewed.  Constitutional:      General: She is not in acute distress.    Appearance: She is well-developed.  HENT:     Head: Normocephalic and atraumatic.  Eyes:     Conjunctiva/sclera: Conjunctivae normal.  Cardiovascular:     Rate and Rhythm: Regular rhythm. Tachycardia present.     Heart sounds: No murmur heard. Pulmonary:     Effort: Pulmonary effort is normal. Tachypnea present. No respiratory distress.     Breath sounds: Normal breath sounds.  Abdominal:     Palpations: Abdomen is soft.     Tenderness: There is no abdominal tenderness.  Musculoskeletal:        General: No swelling.     Cervical back: Neck supple.  Skin:    General: Skin is warm and dry.     Capillary Refill: Capillary refill takes less than 2 seconds.  Neurological:     General: No focal deficit present.     Mental Status: She is alert.     Cranial Nerves: No cranial nerve deficit.  Psychiatric:        Mood and Affect: Mood normal.    ED Results / Procedures / Treatments   EKG EKG Interpretation  Date/Time:  Sunday August 25 2021 17:21:27 EST Ventricular Rate:  118 PR Interval:  164 QRS Duration: 140 QT Interval:  338 QTC Calculation: 473 R Axis:   -57 Text Interpretation: Sinus tachycardia with occasional Premature ventricular complexes Possible Left atrial enlargement Left axis deviation Left ventricular  hypertrophy with QRS widening ( Cornell product ) Nonspecific T wave abnormality Abnormal ECG No significant change since last tracing Confirmed by Deno Etienne [phone]) on 08/25/2021 5:43:53 PM  Procedures Procedures  Medications Ordered in the ED Medications  sodium chloride flush (NS) 0.9 % injection 3 mL (3 mLs Intravenous Given 08/26/21 0934)  sodium chloride flush (NS) 0.9 % injection 3 mL (has no administration in time range)  0.9 %  sodium chloride infusion (has no administration in time range)  acetaminophen (TYLENOL) tablet 650 mg (has no administration in time range)  ondansetron (ZOFRAN) injection 4 mg (has  no administration in time range)  heparin injection 5,000 Units (5,000 Units Subcutaneous Given 08/26/21 1438)  milrinone (PRIMACOR) 20 MG/100 ML (0.2 mg/mL) infusion (0.25 mcg/kg/min  105.7 kg Intravenous Infusion Verify 08/26/21 1200)  aspirin EC tablet 81 mg (81 mg Oral Given 08/26/21 0924)  rosuvastatin (CRESTOR) tablet 40 mg (has no administration in time range)  dapagliflozin propanediol (FARXIGA) tablet 10 mg (10 mg Oral Given 08/26/21 0924)  furosemide (LASIX) 200 mg in dextrose 5 % 100 mL (2 mg/mL) infusion (12 mg/hr Intravenous Rate/Dose Change 08/26/21 1248)  Chlorhexidine Gluconate Cloth 2 % PADS 6 each (6 each Topical Given 08/26/21 0117)  MEDLINE mouth rinse (15 mLs Mouth Rinse Given 08/26/21 1000)  quiniDINE gluconate CR tablet 324 mg (has no administration in time range)  amiodarone (NEXTERONE) 1.8 mg/mL load via infusion 150 mg (150 mg Intravenous Bolus from Bag 08/26/21 0923)    Followed by  amiodarone (NEXTERONE PREMIX) 360-4.14 MG/200ML-% (1.8 mg/mL) IV infusion (0 mg/hr Intravenous Stopped 08/26/21 1458)    Followed by  amiodarone (NEXTERONE PREMIX) 360-4.14 MG/200ML-% (1.8 mg/mL) IV infusion (30 mg/hr Intravenous Rate/Dose Change 08/26/21 1456)  sodium chloride flush (NS) 0.9 % injection 10-40 mL (10 mLs Intracatheter Given 08/26/21 0934)  sodium chloride flush (NS) 0.9 %  injection 10-40 mL (40 mLs Intracatheter Given 08/26/21 1245)  perflutren lipid microspheres (DEFINITY) IV suspension (2.5 mLs Intravenous Given 08/26/21 1057)  ferumoxytol (FERAHEME) 510 mg in sodium chloride 0.9 % 100 mL IVPB (has no administration in time range)  predniSONE (DELTASONE) tablet 20 mg (20 mg Oral Given 08/26/21 1451)  methimazole (TAPAZOLE) tablet 10 mg (10 mg Oral Given 08/26/21 1451)  methimazole (TAPAZOLE) tablet 5 mg (has no administration in time range)  spironolactone (ALDACTONE) tablet 12.5 mg (has no administration in time range)  digoxin (LANOXIN) tablet 0.0625 mg (has no administration in time range)  sodium chloride 0.9 % bolus 250 mL (0 mLs Intravenous Stopped 08/25/21 2004)  iohexol (OMNIPAQUE) 350 MG/ML injection 63 mL (63 mLs Intravenous Contrast Given 08/25/21 2114)  potassium chloride SA (KLOR-CON M) CR tablet 40 mEq (40 mEq Oral Given 08/26/21 0924)  furosemide (LASIX) injection 40 mg (40 mg Intravenous Given 08/26/21 1449)     ED Course       MDM   This patient presents to the ED for concern of fatigue and shortness of breath, this involves an extensive number of treatment options, and is a complaint that carries with it a high risk of complications and morbidity.  The differential diagnosis includes ACS, PE, CHF exacerbation.   Additional history obtained: Additional history obtained from family Records reviewed previous admission documents, Care Everywhere/External Records, and Primary Care Documents  Lab Tests: I Ordered, and personally interpreted labs.  The pertinent results include: Elevated D-dimer.  Troponins of 27 and 29.  Mild creatinine elevation.  Imaging Studies ordered: I ordered imaging studies including CT scan PE and X-ray chest   I independently visualized and interpreted imaging which showed no acute abnormalities. I agree with the radiologist interpretation  EKG (personally reviewed and interpreted): No STEMI or ischemia.  PVCs.  Medical  Decision Making: Patient presented with shortness of breath and fatigue.  She has an extensive history including CHF, stroke, ICD.  She was tachypneic and tachycardic on arrival.  Her pressures were soft.  She was given a small fluid bolus given her CHF history.  D-dimer was ordered and was elevated.  CT PE was negative.  Her troponins were flat at 27 and 29.  BNP was elevated at 1100, this is up from 960 two days ago.  She does not appear acutely volume overloaded with no extremity edema, pulmonary edema, but she does appear to feel poorly. She remains tachycardic and has GI upset. I consulted cardiology for further recommendations. Dr. Conley Canal, cardiology fellow, came to the ED to see the patient. His recommendations were pending at the time of signout. Patient signed out to the oncomign team. Plan to follow up on cardiology recommendations and likely admitted  Complexity of problems addressed: Patients presentation is most consistent with  acute presentation with potential threat to life or bodily function  Disposition: TBD, likely admit  Patient seen in conjunction with my attending, Dr. Tyrone Nine.    Final Clinical Impression(s) / ED Diagnoses Final diagnoses:  Encounter for central line placement  Hyperthyroidism    Rx / DC Orders ED Discharge Orders     None         Jacelyn Pi, MD 08/26/21 [place], [place], DO 08/28/21 1696

## 2021-08-26 ENCOUNTER — Inpatient Hospital Stay (HOSPITAL_COMMUNITY): Payer: BC Managed Care – PPO

## 2021-08-26 ENCOUNTER — Inpatient Hospital Stay: Payer: Self-pay

## 2021-08-26 DIAGNOSIS — I2721 Secondary pulmonary arterial hypertension: Secondary | ICD-10-CM | POA: Diagnosis present

## 2021-08-26 DIAGNOSIS — D696 Thrombocytopenia, unspecified: Secondary | ICD-10-CM | POA: Diagnosis not present

## 2021-08-26 DIAGNOSIS — D62 Acute posthemorrhagic anemia: Secondary | ICD-10-CM | POA: Diagnosis present

## 2021-08-26 DIAGNOSIS — Z515 Encounter for palliative care: Secondary | ICD-10-CM | POA: Diagnosis not present

## 2021-08-26 DIAGNOSIS — I959 Hypotension, unspecified: Secondary | ICD-10-CM | POA: Diagnosis present

## 2021-08-26 DIAGNOSIS — I5032 Chronic diastolic (congestive) heart failure: Secondary | ICD-10-CM | POA: Diagnosis present

## 2021-08-26 DIAGNOSIS — I340 Nonrheumatic mitral (valve) insufficiency: Secondary | ICD-10-CM | POA: Diagnosis present

## 2021-08-26 DIAGNOSIS — R7401 Elevation of levels of liver transaminase levels: Secondary | ICD-10-CM | POA: Diagnosis not present

## 2021-08-26 DIAGNOSIS — G4733 Obstructive sleep apnea (adult) (pediatric): Secondary | ICD-10-CM | POA: Diagnosis present

## 2021-08-26 DIAGNOSIS — Z95811 Presence of heart assist device: Secondary | ICD-10-CM | POA: Diagnosis not present

## 2021-08-26 DIAGNOSIS — R638 Other symptoms and signs concerning food and fluid intake: Secondary | ICD-10-CM | POA: Diagnosis not present

## 2021-08-26 DIAGNOSIS — Z7189 Other specified counseling: Secondary | ICD-10-CM | POA: Diagnosis not present

## 2021-08-26 DIAGNOSIS — I10 Essential (primary) hypertension: Secondary | ICD-10-CM

## 2021-08-26 DIAGNOSIS — I480 Paroxysmal atrial fibrillation: Secondary | ICD-10-CM | POA: Diagnosis not present

## 2021-08-26 DIAGNOSIS — N1831 Chronic kidney disease, stage 3a: Secondary | ICD-10-CM | POA: Diagnosis present

## 2021-08-26 DIAGNOSIS — I472 Ventricular tachycardia, unspecified: Secondary | ICD-10-CM

## 2021-08-26 DIAGNOSIS — D509 Iron deficiency anemia, unspecified: Secondary | ICD-10-CM | POA: Diagnosis present

## 2021-08-26 DIAGNOSIS — Z6841 Body Mass Index (BMI) 40.0 and over, adult: Secondary | ICD-10-CM | POA: Diagnosis not present

## 2021-08-26 DIAGNOSIS — D689 Coagulation defect, unspecified: Secondary | ICD-10-CM | POA: Diagnosis not present

## 2021-08-26 DIAGNOSIS — R531 Weakness: Secondary | ICD-10-CM | POA: Diagnosis not present

## 2021-08-26 DIAGNOSIS — I493 Ventricular premature depolarization: Secondary | ICD-10-CM | POA: Diagnosis not present

## 2021-08-26 DIAGNOSIS — N183 Chronic kidney disease, stage 3 unspecified: Secondary | ICD-10-CM

## 2021-08-26 DIAGNOSIS — E785 Hyperlipidemia, unspecified: Secondary | ICD-10-CM

## 2021-08-26 DIAGNOSIS — I5043 Acute on chronic combined systolic (congestive) and diastolic (congestive) heart failure: Secondary | ICD-10-CM | POA: Diagnosis not present

## 2021-08-26 DIAGNOSIS — Z8249 Family history of ischemic heart disease and other diseases of the circulatory system: Secondary | ICD-10-CM | POA: Diagnosis not present

## 2021-08-26 DIAGNOSIS — N1832 Chronic kidney disease, stage 3b: Secondary | ICD-10-CM | POA: Diagnosis present

## 2021-08-26 DIAGNOSIS — R5381 Other malaise: Secondary | ICD-10-CM | POA: Diagnosis present

## 2021-08-26 DIAGNOSIS — I509 Heart failure, unspecified: Secondary | ICD-10-CM | POA: Diagnosis not present

## 2021-08-26 DIAGNOSIS — Z01810 Encounter for preprocedural cardiovascular examination: Secondary | ICD-10-CM | POA: Diagnosis not present

## 2021-08-26 DIAGNOSIS — I252 Old myocardial infarction: Secondary | ICD-10-CM | POA: Diagnosis not present

## 2021-08-26 DIAGNOSIS — Z01818 Encounter for other preprocedural examination: Secondary | ICD-10-CM | POA: Diagnosis not present

## 2021-08-26 DIAGNOSIS — E46 Unspecified protein-calorie malnutrition: Secondary | ICD-10-CM | POA: Diagnosis present

## 2021-08-26 DIAGNOSIS — E43 Unspecified severe protein-calorie malnutrition: Secondary | ICD-10-CM | POA: Diagnosis present

## 2021-08-26 DIAGNOSIS — E876 Hypokalemia: Secondary | ICD-10-CM | POA: Diagnosis not present

## 2021-08-26 DIAGNOSIS — E0590 Thyrotoxicosis, unspecified without thyrotoxic crisis or storm: Secondary | ICD-10-CM | POA: Diagnosis present

## 2021-08-26 DIAGNOSIS — Z95828 Presence of other vascular implants and grafts: Secondary | ICD-10-CM | POA: Diagnosis not present

## 2021-08-26 DIAGNOSIS — T462X5A Adverse effect of other antidysrhythmic drugs, initial encounter: Secondary | ICD-10-CM | POA: Diagnosis present

## 2021-08-26 DIAGNOSIS — Z20822 Contact with and (suspected) exposure to covid-19: Secondary | ICD-10-CM | POA: Diagnosis present

## 2021-08-26 DIAGNOSIS — E0580 Other thyrotoxicosis without thyrotoxic crisis or storm: Secondary | ICD-10-CM | POA: Diagnosis present

## 2021-08-26 DIAGNOSIS — Z9911 Dependence on respirator [ventilator] status: Secondary | ICD-10-CM | POA: Diagnosis not present

## 2021-08-26 DIAGNOSIS — E119 Type 2 diabetes mellitus without complications: Secondary | ICD-10-CM | POA: Diagnosis present

## 2021-08-26 DIAGNOSIS — I5023 Acute on chronic systolic (congestive) heart failure: Secondary | ICD-10-CM

## 2021-08-26 DIAGNOSIS — N182 Chronic kidney disease, stage 2 (mild): Secondary | ICD-10-CM | POA: Diagnosis not present

## 2021-08-26 DIAGNOSIS — N179 Acute kidney failure, unspecified: Secondary | ICD-10-CM | POA: Diagnosis not present

## 2021-08-26 DIAGNOSIS — J95821 Acute postprocedural respiratory failure: Secondary | ICD-10-CM | POA: Diagnosis not present

## 2021-08-26 DIAGNOSIS — I130 Hypertensive heart and chronic kidney disease with heart failure and stage 1 through stage 4 chronic kidney disease, or unspecified chronic kidney disease: Secondary | ICD-10-CM | POA: Diagnosis present

## 2021-08-26 DIAGNOSIS — I5021 Acute systolic (congestive) heart failure: Secondary | ICD-10-CM | POA: Diagnosis not present

## 2021-08-26 DIAGNOSIS — E039 Hypothyroidism, unspecified: Secondary | ICD-10-CM | POA: Diagnosis present

## 2021-08-26 DIAGNOSIS — I428 Other cardiomyopathies: Secondary | ICD-10-CM | POA: Diagnosis present

## 2021-08-26 DIAGNOSIS — Z7982 Long term (current) use of aspirin: Secondary | ICD-10-CM | POA: Diagnosis not present

## 2021-08-26 DIAGNOSIS — I69351 Hemiplegia and hemiparesis following cerebral infarction affecting right dominant side: Secondary | ICD-10-CM | POA: Diagnosis not present

## 2021-08-26 DIAGNOSIS — Z823 Family history of stroke: Secondary | ICD-10-CM | POA: Diagnosis not present

## 2021-08-26 DIAGNOSIS — I4891 Unspecified atrial fibrillation: Secondary | ICD-10-CM | POA: Diagnosis present

## 2021-08-26 DIAGNOSIS — Z95810 Presence of automatic (implantable) cardiac defibrillator: Secondary | ICD-10-CM | POA: Diagnosis not present

## 2021-08-26 DIAGNOSIS — I5084 End stage heart failure: Secondary | ICD-10-CM | POA: Diagnosis present

## 2021-08-26 DIAGNOSIS — R570 Cardiogenic shock: Secondary | ICD-10-CM | POA: Diagnosis present

## 2021-08-26 DIAGNOSIS — I447 Left bundle-branch block, unspecified: Secondary | ICD-10-CM | POA: Diagnosis present

## 2021-08-26 LAB — COMPREHENSIVE METABOLIC PANEL
ALT: 39 U/L (ref 0–44)
AST: 32 U/L (ref 15–41)
Albumin: 3 g/dL — ABNORMAL LOW (ref 3.5–5.0)
Alkaline Phosphatase: 84 U/L (ref 38–126)
Anion gap: 12 (ref 5–15)
BUN: 22 mg/dL — ABNORMAL HIGH (ref 6–20)
CO2: 18 mmol/L — ABNORMAL LOW (ref 22–32)
Calcium: 9.2 mg/dL (ref 8.9–10.3)
Chloride: 106 mmol/L (ref 98–111)
Creatinine, Ser: 1.46 mg/dL — ABNORMAL HIGH (ref 0.44–1.00)
GFR, Estimated: 44 mL/min — ABNORMAL LOW (ref >60)
Glucose, Bld: 106 mg/dL — ABNORMAL HIGH (ref 70–99)
Potassium: 3.8 mmol/L (ref 3.5–5.1)
Sodium: 136 mmol/L (ref 135–145)
Total Bilirubin: 0.5 mg/dL (ref 0.3–1.2)
Total Protein: 6.4 g/dL — ABNORMAL LOW (ref 6.5–8.1)

## 2021-08-26 LAB — URINALYSIS, ROUTINE W REFLEX MICROSCOPIC
Bilirubin Urine: NEGATIVE
Glucose, UA: NEGATIVE mg/dL
Hgb urine dipstick: NEGATIVE
Ketones, ur: NEGATIVE mg/dL
Leukocytes,Ua: NEGATIVE
Nitrite: NEGATIVE
Protein, ur: NEGATIVE mg/dL
Specific Gravity, Urine: 1.025 (ref 1.005–1.030)
pH: 5 (ref 5.0–8.0)

## 2021-08-26 LAB — COOXEMETRY PANEL
Carboxyhemoglobin: 0.9 % (ref 0.5–1.5)
Methemoglobin: 0.7 % (ref 0.0–1.5)
O2 Saturation: 46.8 %
Total hemoglobin: 10.9 g/dL — ABNORMAL LOW (ref 12.0–16.0)

## 2021-08-26 LAB — RAPID URINE DRUG SCREEN, HOSP PERFORMED
Amphetamines: NOT DETECTED
Barbiturates: NOT DETECTED
Benzodiazepines: NOT DETECTED
Cocaine: NOT DETECTED
Opiates: NOT DETECTED
Tetrahydrocannabinol: NOT DETECTED

## 2021-08-26 LAB — CBC
HCT: 34.1 % — ABNORMAL LOW (ref 36.0–46.0)
Hemoglobin: 10.4 g/dL — ABNORMAL LOW (ref 12.0–15.0)
MCH: 21.9 pg — ABNORMAL LOW (ref 26.0–34.0)
MCHC: 30.5 g/dL (ref 30.0–36.0)
MCV: 71.8 fL — ABNORMAL LOW (ref 80.0–100.0)
Platelets: 342 10*3/uL (ref 150–400)
RBC: 4.75 MIL/uL (ref 3.87–5.11)
RDW: 17.3 % — ABNORMAL HIGH (ref 11.5–15.5)
WBC: 11 10*3/uL — ABNORMAL HIGH (ref 4.0–10.5)
nRBC: 0.3 % — ABNORMAL HIGH (ref 0.0–0.2)

## 2021-08-26 LAB — TSH: TSH: <0.01 u[IU]/mL — ABNORMAL LOW (ref 0.350–4.500)

## 2021-08-26 LAB — ECHOCARDIOGRAM COMPLETE
Calc EF: 9.8 %
MV M vel: 4.5 m/s
MV Peak grad: 81 mmHg
Radius: 0.65 cm
S' Lateral: 6.9 cm
Single Plane A2C EF: 14.9 %
Single Plane A4C EF: 8.9 %
Weight: 3686.09 oz

## 2021-08-26 LAB — IRON AND TIBC
Iron: 32 ug/dL (ref 28–170)
Saturation Ratios: 8 % — ABNORMAL LOW (ref 10.4–31.8)
TIBC: 377 ug/dL (ref 250–450)
UIBC: 345 ug/dL

## 2021-08-26 LAB — ANTITHROMBIN III: AntiThromb III Func: 97 % (ref 75–120)

## 2021-08-26 LAB — HEPATITIS B CORE ANTIBODY, IGM: Hep B C IgM: NONREACTIVE

## 2021-08-26 LAB — FERRITIN: Ferritin: 12 ng/mL (ref 11–307)

## 2021-08-26 LAB — HEPATITIS B SURFACE ANTIGEN: Hepatitis B Surface Ag: NONREACTIVE

## 2021-08-26 LAB — ABO/RH: ABO/RH(D): B POS

## 2021-08-26 LAB — TYPE AND SCREEN
ABO/RH(D): B POS
Antibody Screen: NEGATIVE

## 2021-08-26 LAB — PREALBUMIN: Prealbumin: 13.1 mg/dL — ABNORMAL LOW (ref 18–38)

## 2021-08-26 LAB — LIPID PANEL
Cholesterol: 100 mg/dL (ref 0–200)
HDL: 26 mg/dL — ABNORMAL LOW (ref >40)
LDL Cholesterol: 55 mg/dL (ref 0–99)
Total CHOL/HDL Ratio: 3.8 RATIO
Triglycerides: 95 mg/dL (ref <150)
VLDL: 19 mg/dL (ref 0–40)

## 2021-08-26 LAB — TROPONIN I (HIGH SENSITIVITY): Troponin I (High Sensitivity): 32 ng/L — ABNORMAL HIGH (ref <18)

## 2021-08-26 LAB — LACTATE DEHYDROGENASE: LDH: 221 U/L — ABNORMAL HIGH (ref 98–192)

## 2021-08-26 LAB — HEPATITIS C ANTIBODY: HCV Ab: NONREACTIVE

## 2021-08-26 LAB — T4, FREE: Free T4: 5.11 ng/dL — ABNORMAL HIGH (ref 0.61–1.12)

## 2021-08-26 LAB — MAGNESIUM: Magnesium: 2 mg/dL (ref 1.7–2.4)

## 2021-08-26 LAB — MRSA NEXT GEN BY PCR, NASAL: MRSA by PCR Next Gen: NOT DETECTED

## 2021-08-26 LAB — URIC ACID: Uric Acid, Serum: 11.3 mg/dL — ABNORMAL HIGH (ref 2.5–7.1)

## 2021-08-26 MED ORDER — ROSUVASTATIN CALCIUM 20 MG PO TABS
40.0000 mg | ORAL_TABLET | Freq: Every day | ORAL | Status: DC
  Administered 2021-08-26 – 2021-09-03 (×9): 40 mg via ORAL
  Filled 2021-08-26 (×9): qty 2

## 2021-08-26 MED ORDER — AMIODARONE HCL 200 MG PO TABS: 200.0000 mg | ORAL_TABLET | Freq: Every day | ORAL | Status: DC

## 2021-08-26 MED ORDER — AMIODARONE LOAD VIA INFUSION
150.0000 mg | Freq: Once | INTRAVENOUS | Status: AC
  Administered 2021-08-26: 150 mg via INTRAVENOUS
  Filled 2021-08-26: qty 83.34

## 2021-08-26 MED ORDER — SODIUM CHLORIDE 0.9% FLUSH
3.0000 mL | Freq: Two times a day (BID) | INTRAVENOUS | Status: DC
  Administered 2021-08-26 – 2021-09-01 (×8): 3 mL via INTRAVENOUS

## 2021-08-26 MED ORDER — MILRINONE LACTATE IN DEXTROSE 20-5 MG/100ML-% IV SOLN
0.2500 ug/kg/min | INTRAVENOUS | Status: DC
  Administered 2021-08-26 – 2021-08-28 (×7): 0.25 ug/kg/min via INTRAVENOUS
  Administered 2021-08-28 – 2021-08-29 (×2): 0.375 ug/kg/min via INTRAVENOUS
  Administered 2021-08-30 – 2021-08-31 (×2): 0.125 ug/kg/min via INTRAVENOUS
  Administered 2021-08-31 – 2021-09-02 (×5): 0.25 ug/kg/min via INTRAVENOUS
  Filled 2021-08-26 (×16): qty 100

## 2021-08-26 MED ORDER — IOHEXOL 9 MG/ML PO SOLN
ORAL | Status: AC
  Administered 2021-08-26: 500 mL
  Filled 2021-08-26: qty 1000

## 2021-08-26 MED ORDER — DAPAGLIFLOZIN PROPANEDIOL 10 MG PO TABS
10.0000 mg | ORAL_TABLET | Freq: Every day | ORAL | Status: DC
Start: 2021-08-26 — End: 2021-08-31
  Administered 2021-08-26 – 2021-08-31 (×6): 10 mg via ORAL
  Filled 2021-08-26 (×6): qty 1

## 2021-08-26 MED ORDER — AMIODARONE HCL IN DEXTROSE 360-4.14 MG/200ML-% IV SOLN
60.0000 mg/h | INTRAVENOUS | Status: AC
  Administered 2021-08-26: 60 mg/h via INTRAVENOUS
  Filled 2021-08-26: qty 200

## 2021-08-26 MED ORDER — ORAL CARE MOUTH RINSE
15.0000 mL | Freq: Two times a day (BID) | OROMUCOSAL | Status: DC
  Administered 2021-08-26 – 2021-08-31 (×11): 15 mL via OROMUCOSAL

## 2021-08-26 MED ORDER — FUROSEMIDE 10 MG/ML IJ SOLN
10.0000 mg/h | INTRAVENOUS | Status: DC
  Administered 2021-08-26 (×2): 8 mg/h via INTRAVENOUS
  Administered 2021-08-26: 12 mg/h via INTRAVENOUS
  Administered 2021-08-27: 10 mg/h via INTRAVENOUS
  Filled 2021-08-26 (×6): qty 20

## 2021-08-26 MED ORDER — POTASSIUM CHLORIDE CRYS ER 20 MEQ PO TBCR
40.0000 meq | EXTENDED_RELEASE_TABLET | Freq: Once | ORAL | Status: AC
  Administered 2021-08-26: 40 meq via ORAL
  Filled 2021-08-26: qty 2

## 2021-08-26 MED ORDER — QUINIDINE GLUCONATE ER 324 MG PO TBCR
324.0000 mg | EXTENDED_RELEASE_TABLET | Freq: Two times a day (BID) | ORAL | Status: DC
  Administered 2021-08-27 – 2021-09-20 (×48): 324 mg via ORAL
  Filled 2021-08-26 (×49): qty 1

## 2021-08-26 MED ORDER — SODIUM CHLORIDE 0.9% FLUSH: 3.0000 mL | INTRAVENOUS | Status: DC | PRN

## 2021-08-26 MED ORDER — ONDANSETRON HCL 4 MG/2ML IJ SOLN
4.0000 mg | Freq: Four times a day (QID) | INTRAMUSCULAR | Status: DC | PRN
  Administered 2021-08-27 – 2021-08-31 (×4): 4 mg via INTRAVENOUS
  Filled 2021-08-26 (×6): qty 2

## 2021-08-26 MED ORDER — SODIUM CHLORIDE 0.9% FLUSH
10.0000 mL | Freq: Two times a day (BID) | INTRAVENOUS | Status: DC
  Administered 2021-08-26 – 2021-09-02 (×8): 10 mL

## 2021-08-26 MED ORDER — METHIMAZOLE 10 MG PO TABS
10.0000 mg | ORAL_TABLET | Freq: Every day | ORAL | Status: DC
  Administered 2021-08-26 – 2021-09-02 (×8): 10 mg via ORAL
  Filled 2021-08-26 (×4): qty 1
  Filled 2021-08-26 (×3): qty 2
  Filled 2021-08-26 (×3): qty 1

## 2021-08-26 MED ORDER — SPIRONOLACTONE 12.5 MG HALF TABLET
12.5000 mg | ORAL_TABLET | Freq: Every day | ORAL | Status: DC
  Administered 2021-08-26: 12.5 mg via ORAL
  Filled 2021-08-26: qty 1

## 2021-08-26 MED ORDER — FUROSEMIDE 10 MG/ML IJ SOLN
40.0000 mg | Freq: Once | INTRAMUSCULAR | Status: AC
  Administered 2021-08-26: 40 mg via INTRAVENOUS
  Filled 2021-08-26: qty 4

## 2021-08-26 MED ORDER — PREDNISONE 20 MG PO TABS
20.0000 mg | ORAL_TABLET | Freq: Every day | ORAL | Status: DC
  Administered 2021-08-26 – 2021-09-01 (×7): 20 mg via ORAL
  Filled 2021-08-26 (×8): qty 1

## 2021-08-26 MED ORDER — LEVOTHYROXINE SODIUM 25 MCG PO TABS
25.0000 ug | ORAL_TABLET | Freq: Every day | ORAL | Status: DC
  Administered 2021-08-26: 25 ug via ORAL
  Filled 2021-08-26: qty 1

## 2021-08-26 MED ORDER — ACETAMINOPHEN 325 MG PO TABS
650.0000 mg | ORAL_TABLET | ORAL | Status: DC | PRN
  Administered 2021-08-27 – 2021-08-30 (×3): 650 mg via ORAL
  Filled 2021-08-26 (×3): qty 2

## 2021-08-26 MED ORDER — HEPARIN SODIUM (PORCINE) 5000 UNIT/ML IJ SOLN
5000.0000 [IU] | Freq: Three times a day (TID) | INTRAMUSCULAR | Status: DC
  Administered 2021-08-26 – 2021-08-28 (×7): 5000 [IU] via SUBCUTANEOUS
  Filled 2021-08-26 (×8): qty 1

## 2021-08-26 MED ORDER — ASPIRIN EC 81 MG PO TBEC
81.0000 mg | DELAYED_RELEASE_TABLET | Freq: Every day | ORAL | Status: DC
  Administered 2021-08-26 – 2021-08-27 (×2): 81 mg via ORAL
  Filled 2021-08-26 (×2): qty 1

## 2021-08-26 MED ORDER — QUINIDINE GLUCONATE ER 324 MG PO TBCR
324.0000 mg | EXTENDED_RELEASE_TABLET | Freq: Two times a day (BID) | ORAL | Status: DC
  Filled 2021-08-26: qty 1

## 2021-08-26 MED ORDER — SODIUM CHLORIDE 0.9% FLUSH
10.0000 mL | INTRAVENOUS | Status: DC | PRN
  Administered 2021-08-26: 40 mL

## 2021-08-26 MED ORDER — AMIODARONE HCL IN DEXTROSE 360-4.14 MG/200ML-% IV SOLN
60.0000 mg/h | INTRAVENOUS | Status: DC
  Administered 2021-08-26 – 2021-09-05 (×19): 30 mg/h via INTRAVENOUS
  Administered 2021-09-05 – 2021-09-06 (×4): 60 mg/h via INTRAVENOUS
  Administered 2021-09-06 – 2021-09-07 (×3): 30 mg/h via INTRAVENOUS
  Administered 2021-09-08 – 2021-09-13 (×22): 60 mg/h via INTRAVENOUS
  Filled 2021-08-26 (×6): qty 200
  Filled 2021-08-26: qty 400
  Filled 2021-08-26 (×37): qty 200
  Filled 2021-08-26: qty 400
  Filled 2021-08-26 (×7): qty 200

## 2021-08-26 MED ORDER — METHIMAZOLE 5 MG PO TABS
5.0000 mg | ORAL_TABLET | Freq: Every evening | ORAL | Status: DC
  Administered 2021-08-26 – 2021-09-03 (×8): 5 mg via ORAL
  Filled 2021-08-26 (×5): qty 1
  Filled 2021-08-26: qty 0.5
  Filled 2021-08-26: qty 1
  Filled 2021-08-26 (×2): qty 0.5

## 2021-08-26 MED ORDER — PERFLUTREN LIPID MICROSPHERE
1.0000 mL | INTRAVENOUS | Status: AC | PRN
  Administered 2021-08-26: 2.5 mL via INTRAVENOUS
  Filled 2021-08-26: qty 10

## 2021-08-26 MED ORDER — SODIUM CHLORIDE 0.9 % IV SOLN
510.0000 mg | Freq: Once | INTRAVENOUS | Status: AC
  Administered 2021-08-26: 510 mg via INTRAVENOUS
  Filled 2021-08-26: qty 17

## 2021-08-26 MED ORDER — DIGOXIN 125 MCG PO TABS
0.0625 mg | ORAL_TABLET | Freq: Every day | ORAL | Status: DC
  Administered 2021-08-26 – 2021-09-02 (×8): 0.0625 mg via ORAL
  Filled 2021-08-26 (×8): qty 1

## 2021-08-26 MED ORDER — SODIUM CHLORIDE 0.9 % IV SOLN: 250.0000 mL | INTRAVENOUS | Status: DC | PRN

## 2021-08-26 MED ORDER — TICAGRELOR 90 MG PO TABS
90.0000 mg | ORAL_TABLET | Freq: Two times a day (BID) | ORAL | Status: DC
  Administered 2021-08-26: 90 mg via ORAL
  Filled 2021-08-26: qty 1

## 2021-08-26 MED ORDER — CHLORHEXIDINE GLUCONATE CLOTH 2 % EX PADS
6.0000 | MEDICATED_PAD | Freq: Every day | CUTANEOUS | Status: DC
  Administered 2021-08-26 – 2021-09-02 (×9): 6 via TOPICAL

## 2021-08-26 NOTE — Progress Notes (Signed)
?  Transition of Care (TOC) Screening Note ? ? ?Patient Details  ?Name: [name] ?Date of Birth: 03/29/1971 ? ? ?Transition of Care (TOC) CM/SW Contact:    ?Swan Fairfax, LCSW ?Phone Number: ?08/26/2021, 9:25 AM ? ? ? ?Transition of Care Department [facility]) has reviewed patient and no TOC needs have been identified at this time. We will continue to monitor patient advancement through interdisciplinary progression rounds. Patient will benefit from PT/OT consult for disposition recommendations. If new patient transition needs arise, please place a TOC consult. ?  ?

## 2021-08-26 NOTE — H&P (Signed)
Cardiology Admission History and Physical:   Patient ID: [name] MRN: [id number]; DOB: 1971-01-10   Admission date: 08/25/2021  PCP:  Patient, No Pcp Per (Inactive)   [place] Providers Cardiologist:  [name], MD  Electrophysiologist:  Virl Axe, MD  Sleep Medicine:  Fransico Him, MD       Chief Complaint:  SOB  Patient Profile:   [name] is a 51 y.o. 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  History of Present Illness:   [name] was recently admitted to [facility] from 1/26 - 1/30 for acute on chronic HFrEF exacerbation. She was diuresed as a part of her management and discharged. She reports that she has never really felt well since leaving the hospital. She has had ongoing DOE/SOB, PND and orthopnea. She reports great adherence to all of her medications. Over the past 3 days however, he symptoms have worsened. She states that her SOB has become more significant, she has worsening fatigue, nausea with emesis x3, loose stools, palpitations, LH and chest tightness. She has also had a non-productive cough for >1 month. She denies any fevers, sore throat, rhinorrhea chills, abdominal pain, syncope, weakness, numbness, urinary changes, rashes or lesions. She was seen by her primary cardiology PA on 08/21/21 who recommended that the patient take metolazone given the concern for possible volume overload. Despite this the patient has had no improvement in her symptoms. She therefore presented to the ED for evaluation.  The patient lives with her husband in [place]. She denies tobacco, ETOH or illicit drug use. She is not currently working. No recent illnesses or sick contacts.   In the ED her VS were afebrile, HR 116-130, BP 92-119/59-83, RR 25, and satting 100% on RA.  Labs notable for hbg 11.4, WBC 10.1, bicarb 19, sCr 1.5, BNP 1117, trop 27 -> 29. CXR showed cardiomegaly without pulm edema. CT PE was negative. EKG showed sinus tachycardia with a LBBB. Cardiology was consulted for evaluation.    Past Medical History:  Diagnosis Date   Automatic implantable cardioverter-defibrillator in situ    Chronic CHF (congestive heart failure) ([place])    a. EF 15-20% b. RHC (09/2013) RA 14, RV 57/22, PA 64/36 (48), PCWP 18, FIck CO/CI 3.7/1.6, PVR 8.1 WU, PA sat 47%    History of stomach ulcers    Hypertension    Hypotension    Hypothyroidism    Morbid obesity ([place])    Myocardial infarction ([place]) 08/2013   Nocturnal dyspnea    Nonischemic cardiomyopathy ([facility])    Sinus tachycardia    Sleep apnea    Snoring-prob OSA 09/04/2011   [employer] ICD lead RECALL  6949    UARS (upper airway resistance syndrome) 09/04/2011   HST 12/2013:  AHI 4/hr (numerous episodes of airflow reduction that did not have concomitant desaturation)     Past Surgical History:  Procedure Laterality Date   BREATH TEK H PYLORI N/A 11/09/2014   Procedure: BREATH TEK H PYLORI;  Surgeon: Greer Pickerel, MD;  Location: [facility];  Service: General;  Laterality: N/A;   CARDIAC CATHETERIZATION  ~ 2006; 09/2013   CARDIAC CATHETERIZATION N/A 05/23/2016   Procedure: Right Heart Cath;  Surgeon: Larey Dresser, MD;  Location: [place] CV LAB;  Service: Cardiovascular;  Laterality: N/A;  CARDIAC DEFIBRILLATOR PLACEMENT  2006; 12/26/2013   [employer]onic [id number]; 12-2013 ICD gen change and RV lead revision with new 6935 RV lead by Dr [name]   CESAREAN SECTION  1999   IMPLANTABLE CARDIOVERTER DEFIBRILLATOR GENERATOR CHANGE N/A 12/26/2013   Procedure: IMPLANTABLE CARDIOVERTER DEFIBRILLATOR GENERATOR CHANGE;  Surgeon: Deboraha Sprang, MD;  Location: [facility] CATH LAB;  Service: Cardiovascular;  Laterality: N/A;   IR CT HEAD LTD  04/17/2020   IR CT HEAD LTD  04/17/2020   IR INTRA CRAN STENT  04/17/2020   IR  PERCUTANEOUS ART THROMBECTOMY/INFUSION INTRACRANIAL INC DIAG ANGIO  04/17/2020   IR RADIOLOGIST EVAL & MGMT  06/05/2020   LEAD REVISION N/A 12/26/2013   Procedure: LEAD REVISION;  Surgeon: Deboraha Sprang, MD;  Location: [facility] CATH LAB;  Service: Cardiovascular;  Laterality: N/A;   RADIOLOGY WITH ANESTHESIA N/A 04/17/2020   Procedure: Code Stroke;  Surgeon: Luanne Bras, MD;  Location: [facility];  Service: Radiology;  Laterality: N/A;   RIGHT HEART CATHETERIZATION N/A 02/15/2014   Procedure: RIGHT HEART CATH;  Surgeon: Larey Dresser, MD;  Location: [facility] CATH LAB;  Service: Cardiovascular;  Laterality: N/A;   TUBAL LIGATION  1999     Medications Prior to Admission: Prior to Admission medications   Medication Sig Start Date End Date Taking? Authorizing Provider  acetaminophen (TYLENOL) 500 MG tablet Take 500 mg by mouth every 6 (six) hours as needed for moderate pain or headache.   Yes [provider]  amiodarone (PACERONE) 200 MG tablet TAKE 1 TABLET BY MOUTH ONCE DAILY Patient taking differently: Take 200 mg by mouth daily. 03/21/21  Yes Deboraha Sprang, MD  ASPIRIN LOW DOSE 81 MG EC tablet TAKE 1 TABLET BY MOUTH ONCE A DAY Patient taking differently: Take 81 mg by mouth daily. 05/02/21  Yes Larey Dresser, MD  BRILINTA 90 MG TABS tablet TAKE 1 TABLET BY MOUTH TWICE A DAY Patient taking differently: Take 90 mg by mouth 2 (two) times daily. 05/02/21  Yes Larey Dresser, MD  carvedilol (COREG) 3.125 MG tablet Take 1 tablet (3.125 mg total) by mouth 2 (two) times daily with a meal. 07/22/21  Yes Sheikh, Omair Latif, DO  ENTRESTO 24-26 MG TAKE 1 TABLET BY MOUTH TWICE A DAY Patient taking differently: Take 1 tablet by mouth 2 (two) times daily. 05/02/21  Yes Larey Dresser, MD  FARXIGA 10 MG TABS tablet TAKE 1 TABLET BY MOUTH ONCE A DAY BEFOREBREAKFAST Patient taking differently: Take 10 mg by mouth daily. 02/07/21  Yes Larey Dresser, MD  levothyroxine (SYNTHROID) 25 MCG tablet Take 1  tablet (25 mcg total) by mouth daily before breakfast. 09/23/18  Yes Deboraha Sprang, MD  metolazone (ZAROXOLYN) 2.5 MG tablet Take 1 tablet (2.5 mg total) by mouth as needed. Take 1 tablet with extra 40 mEq of KCL only when directed by AHF clinic Patient taking differently: Take 2.5 mg by mouth daily as needed (fluid when directed by doctor). 07/31/21  Yes Milford, [name] Bo, FNP  potassium chloride SA (KLOR-CON M) 20 MEQ tablet Take 40 mEq by mouth 2 (two) times daily.   Yes [provider]  quiniDINE gluconate 324 MG CR tablet TAKE 1 TABLET BY MOUTH TWICE A DAY Patient taking differently: Take 324 mg by mouth 2 (two) times daily. 07/31/21  Yes Deboraha Sprang, MD  rosuvastatin (CRESTOR) 40 MG tablet Take 1 tablet (40 mg total) by mouth daily. Patient taking differently: Take 40 mg by mouth at bedtime.  05/01/21  Yes McCue, Janett Billow, NP  spironolactone (ALDACTONE) 25 MG tablet TAKE 1 TABLET BY MOUTH EVERY NIGHT AT BEDTIME Patient taking differently: Take 25 mg by mouth at bedtime. 10/26/20  Yes Larey Dresser, MD  torsemide (DEMADEX) 20 MG tablet Take 4 tablets (80 mg total) by mouth every morning AND 2 tablets (40 mg total) every evening. Patient taking differently: Take 4 tablets (80 mg total) by mouth every morning AND 3 tablets (40 mg total) every evening. 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  Tobacco Use   Smoking status: Never   Smokeless tobacco: Never  Vaping Use   Vaping Use: Never used  Substance and Sexual Activity   Alcohol use: No   Drug use: No   Sexual activity: Yes  Other Topics Concern   Not on file  Social History Narrative   Soda sometimes    Right handed   Social Determinants of Health   Financial Resource Strain: Not on file  Food Insecurity: Not on file  Transportation Needs: Not on file  Physical Activity: Not on file  Stress: Not on file  Social Connections: Not on file  Intimate Partner Violence: Not on file    Family History:   The patient's family history includes Heart disease in her maternal grandmother and mother; High blood pressure in her father; Obesity in her mother; Stroke in her father.    ROS:  Please see the history of present illness.  All other ROS reviewed and negative.     Physical Exam/Data:   Vitals:   08/25/21 2222 08/25/21 2245 08/25/21 2300 08/25/21 2330  BP: 115/73 94/80 100/81 98/76  Pulse: (!) 114 (!) 101 (!) 107 (!) 116  Resp: 13 (!) 21 (!) 28 (!) 21  Temp:      TempSrc:      SpO2: 100% 100% 99% 100%   No intake or output data in the 24 hours ending 08/26/21 0032 Last 3 Weights 08/21/2021 07/31/2021 07/22/2021  Weight (lbs) 233 lb 2 oz 230 lb 3.2 oz 233 lb 14.4 oz  Weight (kg) 105.745 kg 104.418 kg 106.096 kg     There is no height or weight on file to calculate BMI.  General:  Very pleasant female in moderate distress, laying up in bed HEENT: atraumatic, normocephalic Neck: JVD elevated to mid-neck at 35%, +HJR Vascular: No carotid bruits; Distal pulses 2+ bilaterally   Cardiac:  Tachycardic with regular rhythm, +S3 gallop, II/VI systolic murmur at apex, no rubs Lungs:  clear to auscultation bilaterally, no wheezing, rhonchi or rales  Abd: soft, nontender, no hepatomegaly, obese Ext: no edema, WWP Musculoskeletal:  No deformities, BUE and BLE strength normal and equal Skin: warm and dry  Neuro:  CNs 2-12 intact, no focal abnormalities noted Psych:  Normal affect    EKG:  The ECG that was done was personally reviewed and demonstrates sinus tachycardia with underlying LBBB    Relevant CV Studies:  TTE 07/21/21:  IMPRESSIONS     1. Left ventricular ejection fraction,  by estimation, is 20 to 25%. The  left ventricle has severely decreased  function. The left ventricle  demonstrates global hypokinesis. The left ventricular internal cavity size  was severely dilated. Indeterminate  diastolic filling due to E-A fusion.   2. Right ventricular systolic function is moderately reduced. The right  ventricular size is normal. There is normal pulmonary artery systolic  pressure.   3. The mitral valve is abnormal. Severe mitral valve regurgitation. No  evidence of mitral stenosis.   4. The aortic valve was not well visualized. Aortic valve regurgitation  is not visualized. No aortic stenosis is present.   5. The inferior vena cava is normal in size with greater than 50%  respiratory variability, suggesting right atrial pressure of 3 mmHg.   CPX 02/19/21:  Conclusion: Exercise testing with gas exchange demonstrates a mild functional impairment when compared to matched sedentary norms. Although PVO2 only mildly reduced, the markedly elevated VE/VCO2 slope and blunted BP response suggest more of a moderate HF limitation. Patient's body habitus is also contributing to exercise intolerance. Overall, PVO2 improved from 2021 and VE/VCO2 has increased from 32-> 41.   [place] 05/23/16:  1. Elevated left heart filling pressure with pulmonary venous hypertension.  2. Low, but not markedly low, cardiac index.   Laboratory Data:  High Sensitivity Troponin:   Recent Labs  Lab 08/25/21 1728 08/25/21 1914 08/25/21 2235  TROPONINIHS 27* 29* 32*      Chemistry Recent Labs  Lab 08/21/21 1208 08/25/21 1728 08/25/21 1914 08/25/21 2250  NA 135 135  --  138  K 3.5 4.0  --  3.9  CL 104 105  --   --   CO2 22 19*  --   --   GLUCOSE 103* 115*  --   --   BUN 19 20  --   --   CREATININE 1.30* 1.50*  --   --   CALCIUM 9.1 9.3  --   --   MG  --   --  2.0  --   GFRNONAA 50* 42*  --   --   ANIONGAP 9 11  --   --     Recent Labs  Lab 08/25/21 1914  PROT 6.6  ALBUMIN 3.1*   AST 29  ALT 38  ALKPHOS 86  BILITOT 0.7   Lipids No results for input(s): CHOL, TRIG, HDL, LABVLDL, LDLCALC, CHOLHDL in the last 168 hours. Hematology Recent Labs  Lab 08/25/21 1728 08/25/21 2250  WBC 10.1  --   RBC 5.15*  --   HGB 11.4* 11.2*  HCT 37.9 33.0*  MCV 73.6*  --   MCH 22.1*  --   MCHC 30.1  --   RDW 17.4*  --   PLT 401*  --    Thyroid No results for input(s): TSH, FREET4 in the last 168 hours. BNP Recent Labs  Lab 08/21/21 1208 08/25/21 1728  BNP 957.7* 1,117.0*    DDimer  Recent Labs  Lab 08/25/21 1914  DDIMER 1.32*     Radiology/Studies:  DG Chest 2 View  Result Date: 08/25/2021 CLINICAL DATA:  Shortness of breath EXAM: CHEST - 2 VIEW COMPARISON:  07/20/2021 FINDINGS: Gross cardiomegaly. Left chest multi lead pacer defibrillator. Both lungs are clear. The visualized skeletal structures are unremarkable. IMPRESSION: Gross cardiomegaly without acute abnormality of the lungs. Electronically Signed   By: Delanna Ahmadi M.D.   On: 08/25/2021 18:49   CT Angio Chest PE W and/or Wo Contrast  Result Date: 08/25/2021 CLINICAL DATA:  Elevated D-dimer and shortness of breath EXAM: CT ANGIOGRAPHY CHEST WITH CONTRAST  TECHNIQUE: Multidetector CT imaging of the chest was performed using the standard protocol during bolus administration of intravenous contrast. Multiplanar CT image reconstructions and MIPs were obtained to evaluate the vascular anatomy. RADIATION DOSE REDUCTION: This exam was performed according to the departmental dose-optimization program which includes automated exposure control, adjustment of the mA and/or kV according to patient size and/or use of iterative reconstruction technique. CONTRAST:  55m OMNIPAQUE IOHEXOL 350 MG/ML SOLN COMPARISON:  Chest x-ray from earlier in the same day. FINDINGS: Cardiovascular: No aneurysmal dilatation of the aorta is noted. The aorta is incompletely opacified. Cardiac enlargement is noted. Defibrillator is again noted and  stable. Pulmonary artery is well visualized within normal branching pattern. No filling defect to suggest pulmonary embolism is noted. Mediastinum/Nodes: Thoracic inlet is well visualized and within normal limits. No sizable hilar or mediastinal adenopathy is noted. Small hilar lymph nodes are noted likely reactive in nature. The esophagus as visualized is within normal limits. Lungs/Pleura: Lungs are well aerated bilaterally. Minimal dependent atelectatic changes are seen. No effusion or parenchymal nodule is noted. Upper Abdomen: Visualized upper abdomen appears within normal limits. Musculoskeletal: No acute rib abnormality is noted. Mild degenerative changes of thoracic spine are seen. Review of the MIP images confirms the above findings. IMPRESSION: No evidence of pulmonary embolism. No acute abnormality is noted. Electronically Signed   By: MInez CatalinaM.D.   On: 08/25/2021 21:23   [place]KG SITE RITE  Result Date: 08/26/2021 If Site Rite image not attached, placement could not be confirmed due to current cardiac rhythm.    Assessment and Plan:   ADIEM DICOCCOis a 51y.o. 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  #Acute on Chronic HFrEF Exacerbation ::Patient presenting with symptoms of a low output state including SOB, fatigue, and GI upset. My bedside POCUS shows an EF now <20% and a plethoric IVC. Her tachycardia makes me very concerned that she is borderline in cardiogenic shock. I discussed this case with Dr. BMissy Sabins She needs ionotropic support and diuresis so will start milrinone and a lasix gtt. Will admit to the ICU for closer monitoring. Depending on her trajectory she may require advanced HF therapies.  -Admit to the ICU -start milrinone per pharmacy -start lasix gtt at 8 with  goal net - 1-2 L daily -hold home coreg 3.125 BID for low output state -hold home entresto given high risk for hypotension -continue dapagliflozin 10 mg daily -hold home metolazone, spironolactone and torsemide -order PICC line -strict I/Os -daily weights -TTE -maintain telemetry  #Severe MR ::Admittedly, her known severe MR may be contributing the most to her dyspnea, but it's unclear at this time. I think that she will likely need a TEE once more stable to better evaluate her MR. If it is secondary MR then consideration for MitraClip would be warranted particularly with how symptomatic she is.  -TTE as above -diuresis as above -consider pursuing TEE once more stable for better evaluation of her MR. -if MR is confirmed to be severe and the patient stabilizes, would consider MitraClip  #HTN #HLD -holding antihypertensives as above -continue crestor 40 mg daily  #VT/VF -continue amiodarone  #Prior CVA -continue ASA and plavix -not sure if plavix is still necessary given that her stent was placed in 10/21, can consider asking  neurology  #Hypothyroidism -continue home synthroid -check TSH   #IDA -check iron studies  #CKD3 ::Mildly elevated sCr above baseline at 1.5. Will monitor her sCr while diuresing. -CTMM     Risk Assessment/Risk Scores:       New York Heart Association (NYHA) Functional Class NYHA Class IV     Severity of Illness: The appropriate patient status for this patient is INPATIENT. Inpatient status is judged to be reasonable and necessary in order to provide the required intensity of service to ensure the patient's safety. The patient's presenting symptoms, physical exam findings, and initial radiographic and laboratory data in the context of their chronic comorbidities is felt to place them at high risk for further clinical deterioration. Furthermore, it is not anticipated that the patient will be medically stable for discharge from the hospital  within 2 midnights of admission.   * I certify that at the point of admission it is my clinical judgment that the patient will require inpatient hospital care spanning beyond 2 midnights from the point of admission due to high intensity of service, high risk for further deterioration and high frequency of surveillance required.*   For questions or updates, please contact [place] Please consult [email or web address] for contact info under     Signed, Hershal Coria, MD  08/26/2021 12:32 AM

## 2021-08-26 NOTE — Progress Notes (Addendum)
IV consult for R DL PICC re: not flushing and occluded. Assessed newly inserted R PICC, have ti pull out 4 cm to work, flushed easily and have GBR. MD at bedside and explained R PICC problem. RN aware and CXR ordered to verify PICC placement. Patient has 2 working PIVs. Will follow up. ? ?4037 CXR done. PICC tip is in brachiocephalic vein. Recommend PICC exchange. RN aware. ?

## 2021-08-26 NOTE — Progress Notes (Signed)
?  Echocardiogram ?2D Echocardiogram has been performed. ? ?[name] ?08/26/2021, 10:56 AM ?

## 2021-08-26 NOTE — Consult Note (Signed)
ELECTROPHYSIOLOGY CONSULT NOTE    Patient ID: [name] MRN: [id number], DOB/AGE: 09-14-1970 51 y.o.  Admit date: 08/25/2021 Date of Consult: 08/26/2021  Primary Physician: Patient, No Pcp Per (Inactive) Primary Cardiologist: [name], MD  Electrophysiologist: Dr. [name]  Referring Provider: Dr. Quentin Ore  Patient Profile: [name] is a 51 y.o. 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 [name].  HPI:  [name] is a 51 y.o. female with medical history as above.   VT has been overall well managed on amiodarone + quinidine.   Recently admitted 1/26-1/30 for A/C CHF.     Never really feels like she got back to her baseline with ongoing DOE/SOB, PND, and orthopnea with worsening x 3-4 days PTA.   She also reports worsening fatigue, nausea with emesis x3, loose stools, palpitations, LH and chest tightness and non-productive cough for >1 month.  She denies any fevers, sore throat, rhinorrhea chills, abdominal pain, syncope, weakness, numbness, urinary changes, rashes or lesions.   Seen in office 08/21/2021 gen cards She was seen by her primary cardiology PA on 08/21/21 who recommended that the patient take metolazone given the concern for possible volume overload. Despite this the patient has had no improvement in her symptoms. She therefore presented to the ED for evaluation.  Labs include K 3.8, Cr 1.5 (near baseline), Mg 2.0, TSH undetectable, Free T4 elevated at 5.11 on levothyroxine and po amio PTA.   Noted to have frequent PVCs and NSVT with palpitations in setting of milrinone; Amiodarone converted over to IV, but felt to be poor long-term option given her thyroid issue. Medicine and EP consulted for management of thyroid and NSVT,  respectively.   She is in sinus tachycardia currently and appears somewhat SOB at rest, at least with conversation. Repeats history as above. Had been taking amiodarone and quinidine as prescribed.    Past Medical History:  Diagnosis Date   Automatic implantable cardioverter-defibrillator in situ    Chronic CHF (congestive heart failure) ([place])    a. EF 15-20% b. RHC (09/2013) RA 14, RV 57/22, PA 64/36 (48), PCWP 18, FIck CO/CI 3.7/1.6, PVR 8.1 WU, PA sat 47%    History of stomach ulcers    Hypertension    Hypotension    Hypothyroidism    Morbid obesity ([place])    Myocardial infarction ([place]) 08/2013   Nocturnal dyspnea    Nonischemic cardiomyopathy ([facility])    Sinus tachycardia    Sleep apnea    Snoring-prob OSA 09/04/2011   S[employer] Fidel[employer] ICD lead RECALL  6949    UARS (upper airway resistance syndrome) 09/04/2011   HST 12/2013:  AHI 4/hr (numerous episodes of airflow reduction that did not have concomitant desaturation)      Surgical History:  Past Surgical History:  Procedure Laterality Date   BREATH TEK H PYLORI N/A 11/09/2014   Procedure: BREATH TEK H PYLORI;  Surgeon: Greer Pickerel, MD;  Location: [facility];  Service: General;  Laterality: N/A;   CARDIAC CATHETERIZATION  ~ 2006; 09/2013   CARDIAC CATHETERIZATION N/A 05/23/2016   Procedure: Right Heart Cath;  Surgeon: Larey Dresser, MD;  Location: [facility];  Service: Cardiovascular;  Laterality: N/A;   CARDIAC DEFIBRILLATOR PLACEMENT  2006; 12/26/2013   Medtronic [id number]; 12-2013 ICD gen change and RV lead revision with new 6935 RV lead by Dr [name]   CESAREAN SECTION  1999   IMPLANTABLE CARDIOVERTER DEFIBRILLATOR GENERATOR CHANGE N/A 12/26/2013   Procedure: IMPLANTABLE CARDIOVERTER DEFIBRILLATOR GENERATOR CHANGE;  Surgeon: Deboraha Sprang, MD;  Location: [facility] CATH LAB;  Service: Cardiovascular;  Laterality: N/A;   IR CT HEAD LTD  04/17/2020   IR CT HEAD LTD  04/17/2020   IR INTRA CRAN STENT  04/17/2020   IR PERCUTANEOUS  ART THROMBECTOMY/INFUSION INTRACRANIAL INC DIAG ANGIO  04/17/2020   IR RADIOLOGIST EVAL & MGMT  06/05/2020   LEAD REVISION N/A 12/26/2013   Procedure: LEAD REVISION;  Surgeon: Deboraha Sprang, MD;  Location: [facility] CATH LAB;  Service: Cardiovascular;  Laterality: N/A;   RADIOLOGY WITH ANESTHESIA N/A 04/17/2020   Procedure: Code Stroke;  Surgeon: Luanne Bras, MD;  Location: [place];  Service: Radiology;  Laterality: N/A;   RIGHT HEART CATHETERIZATION N/A 02/15/2014   Procedure: RIGHT HEART CATH;  Surgeon: Larey Dresser, MD;  Location: [facility] CATH LAB;  Service: Cardiovascular;  Laterality: N/A;   TUBAL LIGATION  1999     Medications Prior to Admission  Medication Sig Dispense Refill Last Dose   acetaminophen (TYLENOL) 500 MG tablet Take 500 mg by mouth every 6 (six) hours as needed for moderate pain or headache.   Past Month   amiodarone (PACERONE) 200 MG tablet TAKE 1 TABLET BY MOUTH ONCE DAILY (Patient taking differently: Take 200 mg by mouth daily.) 90 tablet 1 08/25/2021   ASPIRIN LOW DOSE 81 MG EC tablet TAKE 1 TABLET BY MOUTH ONCE A DAY (Patient taking differently: Take 81 mg by mouth daily.) 30 tablet 11 08/25/2021   BRILINTA 90 MG TABS tablet TAKE 1 TABLET BY MOUTH TWICE A DAY (Patient taking differently: Take 90 mg by mouth 2 (two) times daily.) 60 tablet 11 08/25/2021   carvedilol (COREG) 3.125 MG tablet Take 1 tablet (3.125 mg total) by mouth 2 (two) times daily with a meal. 60 tablet 0 08/25/2021 at 0900   ENTRESTO 24-26 MG TAKE 1 TABLET BY MOUTH TWICE A DAY (Patient taking differently: Take 1 tablet by mouth 2 (two) times daily.) 60 tablet 11 08/25/2021   FARXIGA 10 MG TABS tablet TAKE 1 TABLET BY MOUTH ONCE A DAY BEFOREBREAKFAST (Patient taking differently: Take 10 mg by mouth daily.) 30 tablet 4 08/25/2021   levothyroxine (SYNTHROID) 25 MCG tablet Take 1 tablet (25 mcg total) by mouth daily before breakfast. 90 tablet 3 08/25/2021   metolazone (ZAROXOLYN) 2.5 MG tablet Take 1 tablet (2.5 mg total) by  mouth as needed. Take 1 tablet with extra 40 mEq of KCL only when directed by AHF clinic (Patient taking differently: Take 2.5 mg by mouth daily as needed (fluid when directed by doctor).) 5 tablet 0 08/22/2021   potassium chloride SA (KLOR-CON M) 20 MEQ tablet Take 40 mEq by mouth 2 (two) times daily.   08/25/2021   quiniDINE gluconate 324 MG CR tablet TAKE 1 TABLET BY MOUTH TWICE A DAY (Patient taking differently: Take 324 mg by mouth 2 (two) times daily.) 60 tablet 6 08/25/2021   rosuvastatin (CRESTOR) 40 MG tablet Take 1 tablet (40 mg total) by mouth daily. (Patient taking differently: Take 40 mg by mouth at bedtime.) 30 tablet 5 08/25/2021   spironolactone (ALDACTONE) 25 MG tablet TAKE 1 TABLET BY MOUTH EVERY NIGHT AT BEDTIME (Patient taking differently: Take 25 mg by mouth at bedtime.)  90 tablet 3 08/24/2021   torsemide (DEMADEX) 20 MG tablet Take 4 tablets (80 mg total) by mouth every morning AND 2 tablets (40 mg total) every evening. (Patient taking differently: Take 4 tablets (80 mg total) by mouth every morning AND 3 tablets (40 mg total) every evening.) 540 tablet 2 08/25/2021    Inpatient Medications:   aspirin EC  81 mg Oral Daily   Chlorhexidine Gluconate Cloth  6 each Topical Daily   dapagliflozin propanediol  10 mg Oral Daily   heparin  5,000 Units Subcutaneous Q8H   mouth rinse  15 mL Mouth Rinse BID   quiniDINE gluconate  324 mg Oral BID   rosuvastatin  40 mg Oral QHS   sodium chloride flush  10-40 mL Intracatheter Q12H   sodium chloride flush  3 mL Intravenous Q12H   ticagrelor  90 mg Oral BID    Allergies: No Known Allergies  Social History   Socioeconomic History   Marital status: Married    Spouse name: Robertine Kipper   Number of children: 2   Years of education: Not on file   Highest education level: Not on file  Occupational History   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  Tobacco Use   Smoking status: Never   Smokeless tobacco: Never  Vaping Use   Vaping Use:  Never used  Substance and Sexual Activity   Alcohol use: No   Drug use: No   Sexual activity: Yes  Other Topics Concern   Not on file  Social History Narrative   Soda sometimes   Right handed   Social Determinants of Health   Financial Resource Strain: Not on file  Food Insecurity: Not on file  Transportation Needs: Not on file  Physical Activity: Not on file  Stress: Not on file  Social Connections: Not on file  Intimate Partner Violence: Not on file     Family History  Problem Relation Age of Onset   Heart disease Maternal Grandmother    Heart disease Mother    Obesity Mother    High blood pressure Father    Stroke Father      Review of Systems: All other systems reviewed and are otherwise negative except as noted above.  Physical Exam: Vitals:   08/26/21 1000 08/26/21 1100 08/26/21 1110 08/26/21 1200  BP: 103/84   102/89  Pulse: (!) 118 (!) 123  (!) 124  Resp: (!) '27 20  18  '$ Temp:   98.1 F (36.7 C)   TempSrc:   Oral   SpO2: 98% 100%  98%  Weight:        GEN- The patient is well appearing, alert and oriented x 3 today.   HEENT: normocephalic, atraumatic; sclera clear, conjunctiva pink; hearing intact; oropharynx clear; neck supple Lungs- Clear to ausculation bilaterally, normal work of breathing.  No wheezes, rales, rhonchi Heart- Regular rate and rhythm, no murmurs, rubs or gallops GI- soft, non-tender, non-distended, bowel sounds present Extremities- no clubbing, cyanosis, or edema; DP/PT/radial pulses 2+ bilaterally MS- no significant deformity or atrophy Skin- warm and dry, no rash or lesion Psych- euthymic mood, full affect Neuro- strength and sensation are intact  Labs:   Lab Results  Component Value Date   WBC 11.0 (H) 08/26/2021   HGB 10.4 (L) 08/26/2021   HCT 34.1 (L) 08/26/2021   MCV 71.8 (L) 08/26/2021   PLT 342 08/26/2021    Recent Labs  Lab 08/26/21 0137  NA 136  K 3.8  CL 106  CO2 18*  BUN 22*  CREATININE 1.46*  CALCIUM 9.2   PROT 6.4*  BILITOT 0.5  ALKPHOS 84  ALT 39  AST 32  GLUCOSE 106*      Radiology/Studies: DG Chest 2 View  Result Date: 08/25/2021 CLINICAL DATA:  Shortness of breath EXAM: CHEST - 2 VIEW COMPARISON:  07/20/2021 FINDINGS: Gross cardiomegaly. Left chest multi lead pacer defibrillator. Both lungs are clear. The visualized skeletal structures are unremarkable. IMPRESSION: Gross cardiomegaly without acute abnormality of the lungs. Electronically Signed   By: Delanna Ahmadi M.D.   On: 08/25/2021 18:49   CT Angio Chest PE W and/or Wo Contrast  Result Date: 08/25/2021 CLINICAL DATA:  Elevated D-dimer and shortness of breath EXAM: CT ANGIOGRAPHY CHEST WITH CONTRAST TECHNIQUE: Multidetector CT imaging of the chest was performed using the standard protocol during bolus administration of intravenous contrast. Multiplanar CT image reconstructions and MIPs were obtained to evaluate the vascular anatomy. RADIATION DOSE REDUCTION: This exam was performed according to the departmental dose-optimization program which includes automated exposure control, adjustment of the mA and/or kV according to patient size and/or use of iterative reconstruction technique. CONTRAST:  69m OMNIPAQUE IOHEXOL 350 MG/ML SOLN COMPARISON:  Chest x-ray from earlier in the same day. FINDINGS: Cardiovascular: No aneurysmal dilatation of the aorta is noted. The aorta is incompletely opacified. Cardiac enlargement is noted. Defibrillator is again noted and stable. Pulmonary artery is well visualized within normal branching pattern. No filling defect to suggest pulmonary embolism is noted. Mediastinum/Nodes: Thoracic inlet is well visualized and within normal limits. No sizable hilar or mediastinal adenopathy is noted. Small hilar lymph nodes are noted likely reactive in nature. The esophagus as visualized is within normal limits. Lungs/Pleura: Lungs are well aerated bilaterally. Minimal dependent atelectatic changes are seen. No effusion or  parenchymal nodule is noted. Upper Abdomen: Visualized upper abdomen appears within normal limits. Musculoskeletal: No acute rib abnormality is noted. Mild degenerative changes of thoracic spine are seen. Review of the MIP images confirms the above findings. IMPRESSION: No evidence of pulmonary embolism. No acute abnormality is noted. Electronically Signed   By: MInez CatalinaM.D.   On: 08/25/2021 21:23   [place] SITE RITE  Result Date: 08/26/2021 If Site Rite image not attached, placement could not be confirmed due to current cardiac rhythm.   EKG: on arrival shows sinus tachycardia at 118 bpm, QRS 158 ms. Initial upward deflection in V1 suggestive of IVCD, less likely to benefit from CRT as thought previously (personally reviewed)  TELEMETRY: Sinus tach 100-120s with semi frequent NSVT, polymorphic in appearance at times (personally reviewed)  DEVICE HISTORY: MDT single chamber ICD, gen change and new lead 12/26/13, replacing 6949 lead, original implant 2006, Dr. KCaryl Comes primary prevention   Therapy hx April 2020 appropriate HV therapy for PMVT (in setting of marked hypokalemia) 01/24/2020, VF treated in setting of normal lytes Again with VT therapy 08/12/20 at which time potassium was presumed normal, 2/15--3.9  &  3/1--4.1   AAD Hx PVCs, NSVT Amiodarone started march/April 2020   Assessment/Plan: 1. Acute on Chronic Systolic Heart Failure>>Low output  NICM s/p Medtronic ICD Unclear etiology, but initially identified peri-partum.  Has also had frequent PVCs. Recent CHF admission 1/23. Ltd echo showed EF 20-25%.  Re-admitted with low output symptoms of SOB, fatigue, and GI upset and NYHA IIIb-IV symptoms. CO2 18, bedside echo <20%.  Plan on PICC line for coox, CVP, and milrinone + lasix gtt.  BB on hold for low output  state.  ARNi on hold for hypotension.  Suspect end-stage HF. NYHA class IV. Need to evaluate for advanced therapies, transplant vs LVAD    2. Severe MR - Severe on most  recent limited echo 1/23 - Suspect functional, HR optimization per above  - Plan for updating Echo and possibly considering for MitraClip.    3. VT/VF/ PVCS  - Has ICD, h/o ICD shocks in the past, mostly in setting of hypokalemia  - Frequent NSVT on tele - IV amiodarone started but concerns now for hyperthyroid.  - Continue quinidine - Keep K > 4.0 and Mg > 2.0  - EP asked to see for guidance on managing her VT.  - Will need to continue IV amiodarone at least while on inotrope. - Discussed with Dr. Quentin Ore who does not feel Mexitil would have a significant additive effect.    4. Hyperthyroidism, Previously Hypothyroid  - TSH undetectable, Free T4 5.11 - she was on levothyroxine and PO amio as outpatient - Levothyroxine stopped now.  - Ideally, would stop amio, but in the setting of frequent VT + inotrope support, will need short term IV amio  - Medicine consulted for consideration of methimazole - outpatient endo referral    5. Stage IIIb CKD  - baseline SCr 1.4 - 1.6  Dr. Quentin Ore to see. Do not feel at this time that mexitil would have a significant additive effect with amio AND quinidine. Keep K > 4.0 and Mg > 2.0 . Consider goals of care discussions if no improvement or NSVT worsens.    For questions or updates, please contact [place] Please consult [email or web address] for contact info under Cardiology/STEMI.  Jacalyn Lefevre, PA-C  08/26/2021 12:21 PM

## 2021-08-26 NOTE — Progress Notes (Signed)
Peripherally Inserted Central Catheter Placement ? ?The IV Nurse has discussed with the patient and/or persons authorized to consent for the patient, the purpose of this procedure and the potential benefits and risks involved with this procedure.  The benefits include less needle sticks, lab draws from the catheter, and the patient may be discharged home with the catheter. Risks include, but not limited to, infection, bleeding, blood clot (thrombus formation), and puncture of an artery; nerve damage and irregular heartbeat and possibility to perform a PICC exchange if needed/ordered by physician.  Alternatives to this procedure were also discussed.  Bard Power PICC patient education guide, fact sheet on infection prevention and patient information card has been provided to patient /or left at bedside.   ? ?PICC Placement Documentation  ?PICC exchange performed per MD order without complications. ? ?PICC Double Lumen 08/26/21 Right 39 cm 0 cm (Active)  ?Indication for Insertion or Continuance of Line Vasoactive infusions 08/26/21 1725  ?Exposed Catheter (cm) 0 cm 08/26/21 1725  ?Site Assessment Clean, Dry, Intact 08/26/21 1725  ?Lumen #1 Status Flushed;Saline locked;Blood return noted 08/26/21 1725  ?Lumen #2 Status Flushed;Saline locked;Blood return noted 08/26/21 1725  ?Dressing Type Securing device;Transparent 08/26/21 1725  ?Dressing Status Antimicrobial disc in place 08/26/21 1725  ?Safety Lock Not Applicable 09/32/67 1245  ?Line Care Connections checked and tightened 08/26/21 1725  ?Line Adjustment (NICU/IV Team Only) No 08/26/21 1725  ?Dressing Intervention New dressing 08/26/21 1725  ?Dressing Change Due 09/02/21 08/26/21 1725  ? ? ? ? ? ?Mickel Baas  Duane Earnshaw ?08/26/2021, 5:29 PM ? ?

## 2021-08-26 NOTE — Progress Notes (Addendum)
Advanced Heart Failure Rounding Note  PCP-Cardiologist: [name], MD   Subjective:    3/6 admitted w/ a/c systolic heart failure, NYHA Class IIIb-IV w/ low output, Started on empiric milrinone 0.25 + lasix gtt at 8/hr.   Diuresing but not robust on lasix gtt (did not get bolus prior to infusion). Waiting on PICC line.   SCr 1.5 (baseline ~1.4)  Frequent runs of NSVT on tele.   K 3.8 Mg 2.0   TSH undetectable, Free T4 elevated 5.11 (on levothyroxine and PO amio PTA)    Denies chest pain. Symptomatic w/ PVC/NSVT w/ palpitations.    Objective:   Weight Range: 104.5 kg Body mass index is 40.81 kg/m.   Vital Signs:   Temp:  [97.7 F (36.5 C)-98.3 F (36.8 C)] 98.3 F (36.8 C) (03/06 0733) Pulse Rate:  [98-123] 114 (03/06 0645) Resp:  [11-28] 23 (03/06 0645) BP: (88-119)/(48-95) 88/80 (03/06 0645) SpO2:  [95 %-100 %] 100 % (03/06 0645) Weight:  [104.5 kg] 104.5 kg (03/06 0243)    Weight change: Filed Weights   08/26/21 0243  Weight: 104.5 kg    Intake/Output:   Intake/Output Summary (Last 24 hours) at 08/26/2021 0734 Last data filed at 08/26/2021 0600 Gross per 24 hour  Intake 47.98 ml  Output 750 ml  Net -702.02 ml      Physical Exam    General:  fatigued appearing. No resp difficulty HEENT: Normal Neck: Supple. JVD elevated to jaw. Carotids 2+ bilat; no bruits. No lymphadenopathy or thyromegaly appreciated. Cor: PMI nondisplaced. Irregular rhythm (frequent PVCs, NSVT). 3/6 MR  Lungs: decreased BS at the bases bilaterally  Abdomen: obese, soft, nontender, nondistended. No hepatosplenomegaly. No bruits or masses. Good bowel sounds. Extremities: No cyanosis, clubbing, rash, trace b/l edema Neuro: Alert & orientedx3, cranial nerves grossly intact. moves all 4 extremities w/o difficulty. Affect pleasant   Telemetry   Sinus tach, 110s, frequent PVCs and runs of NSVT   EKG    Sinus tach 111 bpm, PVCs, QT/QTc 338/473 ms  Labs    CBC Recent  Labs    08/25/21 1728 08/25/21 2250 08/26/21 0137  WBC 10.1  --  11.0*  HGB 11.4* 11.2* 10.4*  HCT 37.9 33.0* 34.1*  MCV 73.6*  --  71.8*  PLT 401*  --  824   Basic Metabolic Panel Recent Labs    08/25/21 1728 08/25/21 1914 08/25/21 2250 08/26/21 0137  NA 135  --  138 136  K 4.0  --  3.9 3.8  CL 105  --   --  106  CO2 19*  --   --  18*  GLUCOSE 115*  --   --  106*  BUN 20  --   --  22*  CREATININE 1.50*  --   --  1.46*  CALCIUM 9.3  --   --  9.2  MG  --  2.0  --  2.0   Liver Function Tests Recent Labs    08/25/21 1914 08/26/21 0137  AST 29 32  ALT 38 39  ALKPHOS 86 84  BILITOT 0.7 0.5  PROT 6.6 6.4*  ALBUMIN 3.1* 3.0*   No results for input(s): LIPASE, AMYLASE in the last 72 hours. Cardiac Enzymes No results for input(s): CKTOTAL, CKMB, CKMBINDEX, TROPONINI in the last 72 hours.  BNP: BNP (last 3 results) Recent Labs    07/18/21 1647 08/21/21 1208 08/25/21 1728  BNP 1,331.6* 957.7* 1,117.0*    ProBNP (last 3 results) No results for input(s):  PROBNP in the last 8760 hours.   D-Dimer Recent Labs    08/25/21 1914  DDIMER 1.32*   Hemoglobin A1C No results for input(s): HGBA1C in the last 72 hours. Fasting Lipid Panel No results for input(s): CHOL, HDL, LDLCALC, TRIG, CHOLHDL, LDLDIRECT in the last 72 hours. Thyroid Function Tests Recent Labs    08/26/21 0137  TSH <0.010*    Other results:   Imaging    DG Chest 2 View  Result Date: 08/25/2021 CLINICAL DATA:  Shortness of breath EXAM: CHEST - 2 VIEW COMPARISON:  07/20/2021 FINDINGS: Gross cardiomegaly. Left chest multi lead pacer defibrillator. Both lungs are clear. The visualized skeletal structures are unremarkable. IMPRESSION: Gross cardiomegaly without acute abnormality of the lungs. Electronically Signed   By: Delanna Ahmadi M.D.   On: 08/25/2021 18:49   CT Angio Chest PE W and/or Wo Contrast  Result Date: 08/25/2021 CLINICAL DATA:  Elevated D-dimer and shortness of breath EXAM: CT  ANGIOGRAPHY CHEST WITH CONTRAST TECHNIQUE: Multidetector CT imaging of the chest was performed using the standard protocol during bolus administration of intravenous contrast. Multiplanar CT image reconstructions and MIPs were obtained to evaluate the vascular anatomy. RADIATION DOSE REDUCTION: This exam was performed according to the departmental dose-optimization program which includes automated exposure control, adjustment of the mA and/or kV according to patient size and/or use of iterative reconstruction technique. CONTRAST:  65m OMNIPAQUE IOHEXOL 350 MG/ML SOLN COMPARISON:  Chest x-ray from earlier in the same day. FINDINGS: Cardiovascular: No aneurysmal dilatation of the aorta is noted. The aorta is incompletely opacified. Cardiac enlargement is noted. Defibrillator is again noted and stable. Pulmonary artery is well visualized within normal branching pattern. No filling defect to suggest pulmonary embolism is noted. Mediastinum/Nodes: Thoracic inlet is well visualized and within normal limits. No sizable hilar or mediastinal adenopathy is noted. Small hilar lymph nodes are noted likely reactive in nature. The esophagus as visualized is within normal limits. Lungs/Pleura: Lungs are well aerated bilaterally. Minimal dependent atelectatic changes are seen. No effusion or parenchymal nodule is noted. Upper Abdomen: Visualized upper abdomen appears within normal limits. Musculoskeletal: No acute rib abnormality is noted. Mild degenerative changes of thoracic spine are seen. Review of the MIP images confirms the above findings. IMPRESSION: No evidence of pulmonary embolism. No acute abnormality is noted. Electronically Signed   By: MInez CatalinaM.D.   On: 08/25/2021 21:23   [place] SITE RITE  Result Date: 08/26/2021 If Site Rite image not attached, placement could not be confirmed due to current cardiac rhythm.    Medications:     Scheduled Medications:  amiodarone  200 mg Oral Daily   aspirin EC  81 mg  Oral Daily   Chlorhexidine Gluconate Cloth  6 each Topical Daily   dapagliflozin propanediol  10 mg Oral Daily   heparin  5,000 Units Subcutaneous Q8H   levothyroxine  25 mcg Oral Q0600   mouth rinse  15 mL Mouth Rinse BID   quiniDINE gluconate  324 mg Oral BID   rosuvastatin  40 mg Oral QHS   sodium chloride flush  3 mL Intravenous Q12H   ticagrelor  90 mg Oral BID    Infusions:  sodium chloride     furosemide (LASIX) 200 mg in dextrose 5% 100 mL ('2mg'$ /mL) infusion 8 mg/hr (08/26/21 0600)   milrinone 0.25 mcg/kg/min (08/26/21 0600)    PRN Medications: sodium chloride, acetaminophen, ondansetron (ZOFRAN) IV, sodium chloride flush    Patient Profile   AAVALYN MOLINOis  a 51 y.o. 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 Started on empiric milrinone and lasix gtt.   Assessment/Plan   1. Acute on Chronic Systolic Heart Failure>>Low output  Nonischemic cardiomyopathy.  She has a Medtronic ICD. Cause for cardiomyopathy is uncertain, initially identified peri-partum (after son's birth), so cannot rule out peri-partum CMP.  On and off, she has had frequent PVCs.  RHC in 8/15 showed normal filling pressures and preserved cardiac output.  CPX 9/20 with mildly decreased functional capacity due to body habitus and HF.  CPX in 8/21 also showed mild functional limitation.  Echo in 10/21 showed stable EF 20-25% with severe LV dilation, mildly decreased RV systolic function, and mild-moderate MR. Echo in 4/22 with EF 25%, severe LV dilation.  CPX (8/22) with mild-moderate HF limitation.  Recent CHF admission 1/23. Ltd echo showed EF 20-25%.  -Now readmitted w/ symptoms of a low output state including SOB, fatigue, and GI upset. NYHA Class lllb-IV. CO2 18. Bedside POCUS  showed an EF now <20% and a plethoric IVC. Started on empiric milrinone 0.25 and lasix gtt, 8/hr.  -Place PICC line to monitor co-ox and CVP  -Continue milrinone 0.25. Start IV amio to help w/ NSVT suppression  -Initial suboptimal response to lasix gtt, give 80 mg bolus and increase rate to 12/hr  -Obtain formal 2D echo  -hold home coreg 3.125 BID for low output state -hold home entresto given high risk for hypotension -continue dapagliflozin 10 mg daily -suspect end-stage HF. NYHA class IV. Need to evaluate for advanced therapies, transplant vs LVAD    2. Severe MR - Severe on most recent limited echo 1/23 - Suspect functional, HR optimization per above  - repeat TTE  - ? consideration for MitraClip after HF optimization if still severe   3. VT/VF/ PVCS  - Has ICD, h/o ICD shocks in the past, mostly in setting of hypokalemia  - Frequent NSVT on tele - Start amio gtt for suppression while on milrinone - Keep K > 4.0 and Mg > 2.0   4. Hyperthyroidism, Previously Hypothyroid  - TSH undetectable, Free T4 5.11 - she was on levothyroxine and PO amio as outpatient - stop levothyroxine - ideally, would stop amio, but in the setting of frequent VT + inotrope support, will need short term IV amio  - may need to start methimazole - consult TRH - outpatient endo referral   5. Stage IIIb CKD  - baseline SCr 1.4 - 1.5 on admit - support output w/ milrinone - monitor w/ diuresis    6. Prior CVA - 10/21, had thrombectomy with stent placement.  No LV thrombus noted and no atrial fibrillation noted.  -continue ASA and Brilinta   7. IDA -check iron studies    Length of Stay: 0  Lyda Jester, PA-C  08/26/2021, 7:34 AM  Advanced Heart Failure Team Pager [phone] (M-F; 7a - 5p)  Please contact [facility] for night-coverage after hours (5p -7a ) and weekends on [email or web address]  Patient seen with PA, agree with the above note.   [name] is readmitted with CHF exacerbation with  worsening dyspnea.  Concern for low output HF overnight with HCO3 low at 18, worsening creatinine at 1.46.  She was empirically started on milrinone 0.25.  Noted  to have short NSVT runs on milrinone.    She was also noted to have low free T4 and suppressed TSH.  She has history of hypothyroidism on amiodarone and was supposed to be on Levoxyl 25 mcg/day, but she is not sure if she is taking it.   Echo was done and reviewed, EF < 20% with severe dilation, normal RV size with mildly decreased systolic function, moderate-severe functional MR with dilated IVC.   General: NAD Neck: JVP 16+ cm, no thyromegaly or thyroid nodule.  Lungs: Clear to auscultation bilaterally with normal respiratory effort. CV: Lateral PMI.  Heart mildly tachy, regular S1/S2, +S3,  2/6 HSM apex.  Trace ankle edema.  No carotid bruit.  Normal pedal pulses.  Abdomen: Soft, nontender, no hepatosplenomegaly, no distention.  Skin: Intact without lesions or rashes.  Neurologic: Alert and oriented x 3.  Psych: Normal affect. Extremities: No clubbing or cyanosis.  HEENT: Normal.   1. Acute on chronic systolic HF: Nonischemic cardiomyopathy.  She has a Medtronic ICD. Cause for cardiomyopathy is uncertain, initially identified peri-partum (after son's birth), so cannot rule out peri-partum CMP.  On and off, she has had frequent PVCs.   RHC in 8/15 showed normal filling pressures and preserved cardiac output. Echo in 4/22 with EF 25%, severe LV dilation.  CPX (8/22) with mild-moderate HF limitation.  Echo this admission with EF < 20% with severe dilation, normal RV size with mildly decreased systolic function, moderate-severe functional MR with dilated IVC. She was admitted with volume overload, NYHA class IV symptoms.  I suspect hyperthyroidism is playing a role in her CHF exacerbation.  Concern for low output overnight with HCO3 18 and creatinine 1.5, milrinone 0.25 started empirically.  On exam, she is significantly volume overloaded.   - Continue milrinone 0.25 for now, place PICC to follow CVP and co-ox.      - Will give bolus of Lasix 40 mg IV x 1 and increase gtt to 12 mg/hr.    - Hold Entresto for now with soft BP.  - Hold Coreg with marked volume overload.     - Continue spironolactone 12.5 mg daily.   - Restart digoxin at lower dose 0.0625 daily.  Level was too high with digoxin 0.125 and concomitant quinidine in the past, will check level on lower dose digoxin in 3 days.  - Continue dapagliflozin 10 mg daily.  - ECG with LBBB, QRS 140 msec => with QRS < 150, Dr. [name] has not recommended CRT upgrade in the past.  - Patient has had recurrent CHF exacerbations and we have had to cut back on her outpatient meds.  Concern for low output HF this admission.  She may be nearing the need for advanced therapies (LVAD).  BMI too high currently for transplant (40.8).  Will see how she does with milrinone/diuresis.  If she does not respond well overnight, will aim for Swan/?Impella 5.5.  - She is a candidate for BAT, but concerned at this point we may be nearing LVAD.  2. Pulmonary HTN: Mixed picture with PVR 8.1 on RHC at [facility].  However, repeat study in 8/15 here showed no significant pulmonary arterial hypertension. 2017 RHC with pulmonary venous hypertension.  3. Morbid obesity: BMI still appears out of range for heart transplant.  4. 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  She has been on Levoxyl at home but does not think she has taken for a month.  Now hyperthyroid.  This likely contributes to  her CHF exacerbation. I asked [facility] to evaluate her for this. NSVT on milrinone makes it difficult for [name] to stop amiodarone.  - Stop Levoxyl.  - Will start prednisone 20 mg daily and methimazole bid.  - Will need outpatient endocrinology followup.  - As above, continuing amiodrone for now.  5. PVCs: 11% PVCs + NSVT on 2/20 Zio patch.  Amiodarone started, but PVCs up to 13.3% with NSVT on  3/20 Zio patch. She saw Dr. [name] who thought that the ventricular arrhythmias were electromechanical due to worsening HF.  In 4/20, she had VT x 2 with ICD discharges in the setting of hypokalemia. Zio patch in 1/21 with PVCs down to 6.2% of beats. VT again in 2/22, quinidine started.  Frequent NSVT on milrinone gtt.  - Continue amiodarone gtt. - Continue quinidine.  - Will have EP consult given complicated situation with hyperthyroidism but also with NSVT and on IV amiodarone gtt.  6. CVA: CVA in 10/21, had thrombectomy with stent placement.  No LV thrombus noted and no atrial fibrillation noted.  - Talked with interventional radiology today. >1 year post-stent, will stop ticagrelor and continue ASA.  7. Fe deficiency: She reports heavy periods.  Transferrin saturation low.  - Will give dose of Feraheme.  8. CKD stage 3: Creatinine 1.5, follow with milrinone and diuresis.  9. Mitral regurgitation: Moderate-severe functional MR.  Unlikely to be Mitraclip candidate with severely dilated LV (>7 cm).   [name] 08/26/2021 2:37 PM

## 2021-08-26 NOTE — Progress Notes (Signed)
Peripherally Inserted Central Catheter Placement ? ?The IV Nurse has discussed with the patient and/or persons authorized to consent for the patient, the purpose of this procedure and the potential benefits and risks involved with this procedure.  The benefits include less needle sticks, lab draws from the catheter, and the patient may be discharged home with the catheter. Risks include, but not limited to, infection, bleeding, blood clot (thrombus formation), and puncture of an artery; nerve damage and irregular heartbeat and possibility to perform a PICC exchange if needed/ordered by physician.  Alternatives to this procedure were also discussed.  Bard Power PICC patient education guide, fact sheet on infection prevention and patient information card has been provided to patient /or left at bedside.   ? ?PICC Placement Documentation  ?PICC Double Lumen 22/44/97 Right Basilic 38 cm 0 cm (Active)  ?Indication for Insertion or Continuance of Line Prolonged intravenous therapies 08/26/21 0857  ?Exposed Catheter (cm) 0 cm 08/26/21 0857  ?Site Assessment Clean, Dry, Intact 08/26/21 0857  ?Lumen #1 Status Flushed;Saline locked;Blood return noted 08/26/21 0857  ?Lumen #2 Status Flushed;Saline locked;Blood return noted 08/26/21 0857  ?Dressing Type Transparent;Securing device 08/26/21 0857  ?Dressing Status Antimicrobial disc in place;Clean, Dry, Intact 08/26/21 0857  ?Safety Lock Not Applicable 53/00/51 1021  ?Line Adjustment (NICU/IV Team Only) No 08/26/21 0857  ?Dressing Intervention New dressing;Other (Comment) 08/26/21 0857  ?Dressing Change Due 09/02/21 08/26/21 0857  ? ? ? ? ? ?Enos Fling ?08/26/2021, 8:59 AM ? ?

## 2021-08-26 NOTE — Progress Notes (Signed)
MCS EDUCATION NOTE:   ?             ?VAD evaluation consent reviewed and signed by Mrs. Migues and designated caregiver her spouse Yamileth Hayse.  Initial VAD teaching completed with pt and caregiver.  ? ?VAD educational packet including "Understanding Your Options with Advanced Heart Failure", "[facility] Patient Agreement for VAD Evaluation and Potential Implantation" consent, and [employer] "Heartmate 3 Left Ventricular Device (LVAD) Patient Guide", Heartmate 3 Left Ventricular Assist System Patient Education Program DVD", "[place] HM III Patient Education", "[place] Mechanical Circulatory Support Program", and "Decision Aids for Left Ventricular Assist Device" reviewed in detail and left at bedside for continued reference.   All questions answered regarding VAD implant, hospital stay, and what to expect when discharged home living with a heart pump. Pt identified [name] as her primary caregiver if this therapy should be deemed appropriate for her.  Explained need for 24/7 care when pt is discharged home due to sternal precautions, adaptation to living on support, emotional support, consistent and meticulous exit site care and management, medication adherence and high volume of follow up visits with the [facility] after discharge; both pt and caregiver verbalized understanding of above.  ? ?Explained that LVAD can be implanted for two indications in the setting of advanced left ventricular heart failure treatment: ? ?Bridge to transplant - used for patients who cannot safely wait for heart transplant without this device. ? ?Or ?   ?Destination therapy - used for patients until end of life or recovery of heart function. ? ?Patient and caregiver(s) acknowledge that the indication at this point in time for LVAD therapy would be for DT due to BMI.  ? ?Provided brief equipment overview  ?a) power module   ?b) system controller   ?c) universal Char[employer] fundraiser   ?d) battery clips   ?e) Batteries   ?f)  Perc lock   ?g)  Percutaneous lead  ? ?Discussed:  ?a) changing power source on system controller from tethered (MPU) to untethered (battery) mode   ?b) changing power source on system controller from untethered (battery) to tethered (MPU) mode   ?c) how to monitor battery life both on the system controller and on each individual battery   ?d) changing batteries  ? ?Reviewed and supplied a copy of home inspection check list stressing that only three pronged grounded power outlets can be used for VAD equipment. Mrs. Enrico confirmed home has electrical outlets that will support the equipment along with access working telephone. ? ?Identified the following lifestyle modifications while living on MCS:   ? ?1. No driving for at least three months and then only if doctor gives permission to do so.   ?2. No tub baths while pump implanted, and shower only when doctor gives permission.   ?3. No swimming or submersion in water while implanted with pump.   ?4. No contact sports or engaging in jumping activities.   ?5. Always have a backup controller, charged spare batteries, and battery clips nearby at all times in case of emergency.   ?6. Call the doctor or hospital contact person if any change in how the pump sounds, feels, or works.   ?7. Plan to sleep only when connected to the power module. 8. Do not sleep on your stomach.   ?9. Keep a backup system controller, charged batteries, battery clips, and flashlight near you during sleep in case of electrical power outage.   ?10. Exit site care including dressing changes, monitoring  for infection, and importance of keeping percutaneous lead stabilized at all times.  ?  ? ?Extended the option to have one of our current patients and caregiver(s) come to talk with them about living on support to assist with decision making. We have a pt coming to clinic on Wednesday morning that we will send upstairs to meet with the pt. ? ?Reviewed pictures of VAD drive line, site care, dressing changes, and drive  line stabilization including securement attachment device and abdominal binder. Discussed with pt and family that they will be required to purchase dressing supplies as long as patient has the VAD in place.  ? ?Reinforced need for 24 hour/7 day week caregivers; pt designated her husband [name] as her caregiver. She will also need to abide by sternal precautions with no lifting >10lbs, pushing, pulling and will need assistance with adapting to new life style with VAD equipment and care.  ? ?Intermacs patient survival statistics through September 2022 reviewed with patient and caregiver as follows: ? ? ? ?                                            ?The patient understands that from this discussion it does not mean that they will receive the device, but that depends on an extensive evaluation process. The patient is aware of the fact that if at anytime they want to stop the evaluation process they can. ? ?All questions have been answered at this time and contact information was provided should they encounter any further questions.  They are both agreeable at this time to the evaluation process and will move forward.  ? ?All orders placed, surgeon and social worker aware. ? ? ?Total Session Time: 30 minutes ? ?Tanda Rockers, RN ?VAD Coordinator  ? ?Office: [phone] ?24/7 VAD Pager: [phone] ? ?

## 2021-08-26 NOTE — Progress Notes (Signed)
Heart Failure Navigator Progress Note ? ?Assessed for Heart & Vascular TOC clinic readiness.  ?Patient does not meet criteria due to AHF rounding team consulted..  ? ?Navigator available for educational resources..  ? ?Earnestine Leys, BSN, RN ?Heart Failure Nurse Navigator ?[phone] ? ? ?

## 2021-08-26 NOTE — Progress Notes (Signed)
No ICM remote transmission received for 08/26/2021 due to currently hospitalized and next ICM transmission scheduled for 09/02/2021.   ?

## 2021-08-26 NOTE — ED Provider Notes (Signed)
Patient accepted by cardiology for inpatient care.  They will admit her to the cardiac ICU and start a milrinone drip and try to diurese her. ?  ?Miranda Fuse, MD ?08/26/21 0018 ? ?

## 2021-08-26 NOTE — Consult Note (Addendum)
Medical Consultation   [name]  DXI:[id number]  DOB: Feb 07, 1971  DOA: 08/25/2021  PCP: Patient, No Pcp Per (Inactive)   Requesting physician: Heart failure team  Reason for consultation: Severe hyperthyroidism  History of Present Illness: [name] is an 51 y.o. 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 -Felt to be in low output heart failure started on milrinone and Lasix gtt. also started on amiodarone gtt. for VT VF suppression, frequent NSVT -Today labs noted undetectable TSH, free T4 elevated at 5.1, T3 pending, TRH consulted for this -Pt reports being on Synthroid 25 mcg daily for a number of years as well as amiodarone 200 mg daily for at least 3 years, TSH was previously stable on this regimen. -Does report some diarrhea, denies tremors, insomnia etc   Review of Systems:  ROS As per HPI otherwise 14 point review of systems negative.    Past Medical History: Past Medical History:  Diagnosis Date   Automatic implantable cardioverter-defibrillator in situ    Chronic CHF (congestive heart failure) ([place])    a. EF 15-20% b. RHC (09/2013) RA 14, RV 57/22, PA 64/36 (48), PCWP 18, FIck CO/CI 3.7/1.6, PVR 8.1 WU, PA sat 47%    History of stomach ulcers    Hypertension    Hypotension    Hypothyroidism    Morbid obesity ([place])    Myocardial infarction ([place]) 08/2013   Nocturnal dyspnea    Nonischemic cardiomyopathy ([facility])    Sinus tachycardia    Sleep apnea    Snoring-prob OSA 09/04/2011   Sprint Fidelis ICD lead RECALL  6949    UARS (upper airway resistance syndrome) 09/04/2011   HST 12/2013:  AHI 4/hr (numerous episodes of airflow reduction that did not have concomitant desaturation)     Past Surgical  History: Past Surgical History:  Procedure Laterality Date   BREATH TEK H PYLORI N/A 11/09/2014   Procedure: BREATH TEK H PYLORI;  Surgeon: Greer Pickerel, MD;  Location: [facility];  Service: General;  Laterality: N/A;   CARDIAC CATHETERIZATION  ~ 2006; 09/2013   CARDIAC CATHETERIZATION N/A 05/23/2016   Procedure: Right Heart Cath;  Surgeon: Larey Dresser, MD;  Location: [facility];  Service: Cardiovascular;  Laterality: N/A;   CARDIAC DEFIBRILLATOR PLACEMENT  2006; 12/26/2013   Medtronic [id number]; 12-2013 ICD gen change and RV lead revision with new 6935 RV lead by Dr [name]   CESAREAN SECTION  1999   IMPLANTABLE CARDIOVERTER DEFIBRILLATOR GENERATOR CHANGE N/A 12/26/2013   Procedure: IMPLANTABLE CARDIOVERTER DEFIBRILLATOR GENERATOR CHANGE;  Surgeon: Deboraha Sprang, MD;  Location: [facility] CATH LAB;  Service: Cardiovascular;  Laterality: N/A;   IR CT HEAD LTD  04/17/2020   IR CT HEAD LTD  04/17/2020   IR INTRA CRAN STENT  04/17/2020   IR PERCUTANEOUS ART THROMBECTOMY/INFUSION INTRACRANIAL INC DIAG ANGIO  04/17/2020   IR RADIOLOGIST EVAL & MGMT  06/05/2020   LEAD REVISION N/A 12/26/2013   Procedure: LEAD REVISION;  Surgeon: Deboraha Sprang, MD;  Location: [facility] CATH LAB;  Service: Cardiovascular;  Laterality: N/A;   RADIOLOGY WITH ANESTHESIA N/A 04/17/2020   Procedure: Code Stroke;  Surgeon: Luanne Bras, MD;  Location: [place];  Service: Radiology;  Laterality: N/A;   RIGHT HEART CATHETERIZATION N/A 02/15/2014   Procedure: RIGHT HEART CATH;  Surgeon: Larey Dresser, MD;  Location: [facility] C[facility] LAB;  Service: Cardiovascular;  Laterality: N/A;   TUBAL LIGATION  1999     Allergies:  No Known Allergies   Social History:  reports that she has never smoked. She has never used smokeless tobacco. She reports that she does not drink alcohol and does not use drugs.   Family History: Family History  Problem Relation Age of Onset   Heart disease Maternal Grandmother    Heart disease Mother     Obesity Mother    High blood pressure Father    Stroke Father     Physical Exam: Vitals:   08/26/21 1000 08/26/21 1100 08/26/21 1110 08/26/21 1200  BP: 103/84   102/89  Pulse: (!) 118 (!) 123  (!) 124  Resp: (!) '27 20  18  '$ Temp:   98.1 F (36.7 C)   TempSrc:   Oral   SpO2: 98% 100%  98%  Weight:       Gen: Chronically ill pleasant female laying in bed, awake, Alert, Oriented X 3,  HEENT: Positive JVD Lungs: Poor air movement, decreased at the bases CVS: S1S2/RRR tachycardic Abd: soft, Non tender, non distended, BS present Extremities: No edema Skin: no new rashes on exposed skin   Data reviewed:  I have personally reviewed following labs and imaging studies Labs:  CBC: Recent Labs  Lab 08/25/21 1728 08/25/21 2250 08/26/21 0137  WBC 10.1  --  11.0*  HGB 11.4* 11.2* 10.4*  HCT 37.9 33.0* 34.1*  MCV 73.6*  --  71.8*  PLT 401*  --  275    Basic Metabolic Panel: Recent Labs  Lab 08/21/21 1208 08/25/21 1728 08/25/21 1914 08/25/21 2250 08/26/21 0137  NA 135 135  --  138 136  K 3.5 4.0  --  3.9 3.8  CL 104 105  --   --  106  CO2 22 19*  --   --  18*  GLUCOSE 103* 115*  --   --  106*  BUN 19 20  --   --  22*  CREATININE 1.30* 1.50*  --   --  1.46*  CALCIUM 9.1 9.3  --   --  9.2  MG  --   --  2.0  --  2.0   GFR Estimated Creatinine Clearance: 53.3 mL/min (A) (by C-G formula based on SCr of 1.46 mg/dL (H)). Liver Function Tests: Recent Labs  Lab 08/25/21 1914 08/26/21 0137  AST 29 32  ALT 38 39  ALKPHOS 86 84  BILITOT 0.7 0.5  PROT 6.6 6.4*  ALBUMIN 3.1* 3.0*   No results for input(s): LIPASE, AMYLASE in the last 168 hours. No results for input(s): AMMONIA in the last 168 hours. Coagulation profile No results for input(s): INR, PROTIME in the last 168 hours.  Cardiac Enzymes: No results for input(s): CKTOTAL, CKMB, CKMBINDEX, TROPONINI in the last 168 hours. BNP: Invalid input(s): POCBNP CBG: No results for input(s): GLUCAP in the last 168  hours. D-Dimer Recent Labs    08/25/21 1914  DDIMER 1.32*   Hgb A1c No results for input(s): HGBA1C in the last 72 hours. Lipid Profile No results for input(s): CHOL, HDL, LDLCALC, TRIG, CHOLHDL, LDLDIRECT in the last 72 hours. Thyroid function studies Recent Labs    08/26/21 0137  TSH <0.010*   Anemia work up Recent Labs    08/26/21 0137  FERRITIN 12  TIBC 377  IRON 32   Urinalysis    Component Value Date/Time   COLORURINE YELLOW 08/26/2021 0636   APPEARANCEUR CLEAR 08/26/2021 0636   APPEARANCEUR Turbid 09/19/2013 1635   LABSPEC 1.025 08/26/2021 0636   LABSPEC 1.027 09/19/2013 1635   PHURINE 5.0 08/26/2021 0636   GLUCOSEU NEGATIVE 08/26/2021 0636   GLUCOSEU Negative 09/19/2013 1635   HGBUR NEGATIVE 08/26/2021 0636   BILIRUBINUR NEGATIVE 08/26/2021 0636   BILIRUBINUR Negative 09/19/2013 1635   KETONESUR NEGATIVE 08/26/2021 0636   PROTEINUR NEGATIVE 08/26/2021 0636   NITRITE NEGATIVE 08/26/2021 0636   LEUKOCYTESUR NEGATIVE 08/26/2021 0636   LEUKOCYTESUR Negative 09/19/2013 1635     Sepsis Labs Invalid input(s): PROCALCITONIN,  WBC,  LACTICIDVEN Microbiology Recent Results (from the past 240 hour(s))  Resp Panel by RT-PCR (Flu A&B, Covid) Nasopharyngeal Swab     Status: None   Collection Time: 08/25/21 10:35 PM   Specimen: Nasopharyngeal Swab; Nasopharyngeal(NP) swabs in vial transport medium  Result Value Ref Range Status   SARS Coronavirus 2 by RT PCR NEGATIVE NEGATIVE Final    Comment: (NOTE) SARS-CoV-2 target nucleic acids are NOT DETECTED.  The SARS-CoV-2 RNA is generally detectable in upper respiratory specimens during the acute phase of infection. The lowest concentration of SARS-CoV-2 viral copies this assay can detect is 138 copies/mL. A negative result does not preclude SARS-Cov-2 infection and should not be used as the sole basis for treatment or other patient management decisions. A negative result may occur with  improper specimen  collection/handling, submission of specimen other than nasopharyngeal swab, presence of viral mutation(s) within the areas targeted by this assay, and inadequate number of viral copies(<138 copies/mL). A negative result must be combined with clinical observations, patient history, and epidemiological information. The expected result is Negative.  Fact Sheet for Patients:  EntrepreneurPulse.com.au  Fact Sheet for Healthcare Providers:  IncredibleEmployment.be  This test is no t yet approved or cleared by the [place] FDA and  has been authorized for detection and/or diagnosis of SARS-CoV-2 by FDA under an Emergency Use Authorization (EUA). This EUA will remain  in effect (meaning this test can be used) for the duration of the COVID-19 declaration under Section 564(b)(1) of the Act, 21 U.S.C.section 360bbb-3(b)(1), unless the authorization is terminated  or revoked sooner.       Influenza A by PCR NEGATIVE NEGATIVE Final   Influenza B by PCR NEGATIVE NEGATIVE Final    Comment: (NOTE) The Xpert Xpress SARS-CoV-2/FLU/RSV plus assay is intended as an aid in the diagnosis of influenza from Nasopharyngeal swab specimens and should not be used as a sole basis for treatment. Nasal washings and aspirates are unacceptable for Xpert Xpress SARS-CoV-2/FLU/RSV testing.  Fact Sheet for Patients: EntrepreneurPulse.com.au  Fact Sheet for Healthcare Providers: IncredibleEmployment.be  This test is not yet approved or cleared by the [place] FDA and has been authorized for detection and/or diagnosis of SARS-CoV-2 by FDA under an Emergency Use Authorization (EUA). This EUA will remain in effect (meaning this test can be used) for the duration of the COVID-19 declaration under Section 564(b)(1) of the Act, 21 U.S.C. section 360bbb-3(b)(1), unless the authorization is terminated or revoked.  Performed at [facility], [place] [address], [place], [place] [address]   MRSA Next Gen by PCR, Nasal     Status: None   Collection Time: 08/26/21  2:24 AM   Specimen: Nasal Mucosa; Nasal Swab  Result Value Ref Range Status   MRSA by PCR Next Gen NOT DETECTED  NOT DETECTED Final    Comment: (NOTE) The GeneXpert MRSA Assay (FDA approved for NASAL specimens only), is one component of a comprehensive MRSA colonization surveillance program. It is not intended to diagnose MRSA infection nor to guide or monitor treatment for MRSA infections. Test performance is not FDA approved in patients less than 35 years old. Performed at [facility], [place] [address], [place], [place] [address]        Inpatient Medications:   Scheduled Meds:  aspirin EC  81 mg Oral Daily   Chlorhexidine Gluconate Cloth  6 each Topical Daily   dapagliflozin propanediol  10 mg Oral Daily   heparin  5,000 Units Subcutaneous Q8H   mouth rinse  15 mL Mouth Rinse BID   quiniDINE gluconate  324 mg Oral BID   rosuvastatin  40 mg Oral QHS   sodium chloride flush  10-40 mL Intracatheter Q12H   sodium chloride flush  3 mL Intravenous Q12H   ticagrelor  90 mg Oral BID   Continuous Infusions:  sodium chloride     amiodarone 60 mg/hr (08/26/21 1200)   Followed by   amiodarone     furosemide (LASIX) 200 mg in dextrose 5% 100 mL ('2mg'$ /mL) infusion 8 mg/hr (08/26/21 1200)   milrinone 0.25 mcg/kg/min (08/26/21 1200)     Radiological Exams on Admission: DG Chest 2 View  Result Date: 08/25/2021 CLINICAL DATA:  Shortness of breath EXAM: CHEST - 2 VIEW COMPARISON:  07/20/2021 FINDINGS: Gross cardiomegaly. Left chest multi lead pacer defibrillator. Both lungs are clear. The visualized skeletal structures are unremarkable. IMPRESSION: Gross cardiomegaly without acute abnormality of the lungs. Electronically Signed   By: Delanna Ahmadi M.D.   On: 08/25/2021 18:49   CT Angio Chest PE W and/or Wo Contrast  Result Date: 08/25/2021 CLINICAL  DATA:  Elevated D-dimer and shortness of breath EXAM: CT ANGIOGRAPHY CHEST WITH CONTRAST TECHNIQUE: Multidetector CT imaging of the chest was performed using the standard protocol during bolus administration of intravenous contrast. Multiplanar CT image reconstructions and MIPs were obtained to evaluate the vascular anatomy. RADIATION DOSE REDUCTION: This exam was performed according to the departmental dose-optimization program which includes automated exposure control, adjustment of the mA and/or kV according to patient size and/or use of iterative reconstruction technique. CONTRAST:  47m OMNIPAQUE IOHEXOL 350 MG/ML SOLN COMPARISON:  Chest x-ray from earlier in the same day. FINDINGS: Cardiovascular: No aneurysmal dilatation of the aorta is noted. The aorta is incompletely opacified. Cardiac enlargement is noted. Defibrillator is again noted and stable. Pulmonary artery is well visualized within normal branching pattern. No filling defect to suggest pulmonary embolism is noted. Mediastinum/Nodes: Thoracic inlet is well visualized and within normal limits. No sizable hilar or mediastinal adenopathy is noted. Small hilar lymph nodes are noted likely reactive in nature. The esophagus as visualized is within normal limits. Lungs/Pleura: Lungs are well aerated bilaterally. Minimal dependent atelectatic changes are seen. No effusion or parenchymal nodule is noted. Upper Abdomen: Visualized upper abdomen appears within normal limits. Musculoskeletal: No acute rib abnormality is noted. Mild degenerative changes of thoracic spine are seen. Review of the MIP images confirms the above findings. IMPRESSION: No evidence of pulmonary embolism. No acute abnormality is noted. Electronically Signed   By: MInez CatalinaM.D.   On: 08/25/2021 21:23   UKoreaEKG SITE RITE  Result Date: 08/26/2021 If Site Rite image not attached, placement could not be confirmed due to current cardiac rhythm.   Impression/Recommendations Principal  Problem:  Acute on  chronic systolic CHF Severe MR Low output heart failure -Last echo 4/22 EF 25% with severe LV dilation -Now on Lasix gtt., milrinone gtt. -Coreg, Entresto on hold -Per heart failure team  Amiodarone induced hyperthyroidism/thyrotoxicosis -Long history of mild hypothyroidism, has been on levothyroxine 25 mcg for years, she thinks she may have ran out in the past month, unsure -Has been on amiodarone for 3+ years -TSH undetectable (levels checked before she received synthroid here) was normal 1 month ago, free T4 elevated at 5, T3 pending -I called and discussed case with endocrinology, since we are unable to differentiate between type I versus type II AIT -Recommended prednisone 20 Mg daily, and methimazole 10 Mg in AM and 5 mg every afternoon -She will need close endocrinology follow-up in 2 to 3 weeks with repeat labs, and then attempt to taper prednisone and adjust methimazole  History of VT/VF/frequent PVCs -Has ICD, on amnio gtt. while on milrinone -Currently on amiodarone gtt. and p.o. quinidine -Ideally would not recommend amnio gtt. for long, despite the fact it has a very long half-life  Thank you for this consultation.  Our [facility] hospitalist team will follow the patient with you.   Time Spent: 53mn  PDomenic PoliteM.D. [facility] 08/26/2021, 12:22 PM

## 2021-08-27 ENCOUNTER — Inpatient Hospital Stay (HOSPITAL_COMMUNITY): Payer: BC Managed Care – PPO

## 2021-08-27 ENCOUNTER — Encounter (HOSPITAL_COMMUNITY): Payer: Medicare Other

## 2021-08-27 DIAGNOSIS — Z01818 Encounter for other preprocedural examination: Secondary | ICD-10-CM | POA: Diagnosis not present

## 2021-08-27 DIAGNOSIS — Z515 Encounter for palliative care: Secondary | ICD-10-CM | POA: Diagnosis not present

## 2021-08-27 DIAGNOSIS — I509 Heart failure, unspecified: Secondary | ICD-10-CM | POA: Diagnosis not present

## 2021-08-27 DIAGNOSIS — I5023 Acute on chronic systolic (congestive) heart failure: Secondary | ICD-10-CM | POA: Diagnosis not present

## 2021-08-27 LAB — PULMONARY FUNCTION TEST
DL/VA % pred: 144 %
DL/VA: 6.27 ml/min/mmHg/L
DLCO cor % pred: 93 %
DLCO cor: 18.93 ml/min/mmHg
DLCO unc % pred: 83 %
DLCO unc: 16.92 ml/min/mmHg
FEF 25-75 Pre: 1.73 L/sec
FEF2575-%Pred-Pre: 72 %
FEV1-%Pred-Pre: 76 %
FEV1-Pre: 1.72 L
FEV1FVC-%Pred-Pre: 100 %
FEV6-%Pred-Pre: 77 %
FEV6-Pre: 2.09 L
FEV6FVC-%Pred-Pre: 102 %
FVC-%Pred-Pre: 74 %
FVC-Pre: 2.09 L
Pre FEV1/FVC ratio: 82 %
Pre FEV6/FVC Ratio: 100 %

## 2021-08-27 LAB — BASIC METABOLIC PANEL
Anion gap: 10 (ref 5–15)
Anion gap: 11 (ref 5–15)
BUN: 14 mg/dL (ref 6–20)
BUN: 16 mg/dL (ref 6–20)
CO2: 24 mmol/L (ref 22–32)
CO2: 23 mmol/L (ref 22–32)
Calcium: 8.6 mg/dL — ABNORMAL LOW (ref 8.9–10.3)
Calcium: 8.8 mg/dL — ABNORMAL LOW (ref 8.9–10.3)
Chloride: 98 mmol/L (ref 98–111)
Chloride: 100 mmol/L (ref 98–111)
Creatinine, Ser: 1.24 mg/dL — ABNORMAL HIGH (ref 0.44–1.00)
Creatinine, Ser: 1.28 mg/dL — ABNORMAL HIGH (ref 0.44–1.00)
GFR, Estimated: 53 mL/min — ABNORMAL LOW (ref >60)
GFR, Estimated: 51 mL/min — ABNORMAL LOW (ref >60)
Glucose, Bld: 197 mg/dL — ABNORMAL HIGH (ref 70–99)
Glucose, Bld: 183 mg/dL — ABNORMAL HIGH (ref 70–99)
Potassium: 2.9 mmol/L — ABNORMAL LOW (ref 3.5–5.1)
Potassium: 4 mmol/L (ref 3.5–5.1)
Sodium: 132 mmol/L — ABNORMAL LOW (ref 135–145)
Sodium: 134 mmol/L — ABNORMAL LOW (ref 135–145)

## 2021-08-27 LAB — GLUCOSE, CAPILLARY
Glucose-Capillary: 121 mg/dL — ABNORMAL HIGH (ref 70–99)
Glucose-Capillary: 134 mg/dL — ABNORMAL HIGH (ref 70–99)
Glucose-Capillary: 135 mg/dL — ABNORMAL HIGH (ref 70–99)

## 2021-08-27 LAB — HEMOGLOBIN A1C
Hgb A1c MFr Bld: 5.1 % (ref 4.8–5.6)
Mean Plasma Glucose: 99.67 mg/dL

## 2021-08-27 LAB — MAGNESIUM: Magnesium: 1.9 mg/dL (ref 1.7–2.4)

## 2021-08-27 LAB — COOXEMETRY PANEL
Carboxyhemoglobin: 0.9 % (ref 0.5–1.5)
Methemoglobin: <0.7 % (ref 0.0–1.5)
O2 Saturation: 54.7 %
Total hemoglobin: 11.3 g/dL — ABNORMAL LOW (ref 12.0–16.0)

## 2021-08-27 LAB — T3: T3, Total: 220 ng/dL — ABNORMAL HIGH (ref 71–180)

## 2021-08-27 MED ORDER — ALTEPLASE 2 MG IJ SOLR
2.0000 mg | Freq: Once | INTRAMUSCULAR | Status: AC
  Administered 2021-08-27: 2 mg
  Filled 2021-08-27: qty 2

## 2021-08-27 MED ORDER — SODIUM CHLORIDE 0.9% FLUSH: 3.0000 mL | INTRAVENOUS | Status: DC | PRN

## 2021-08-27 MED ORDER — PROSOURCE PLUS PO LIQD
30.0000 mL | Freq: Three times a day (TID) | ORAL | Status: DC
  Administered 2021-08-27 – 2021-08-28 (×4): 30 mL via ORAL
  Filled 2021-08-27 (×4): qty 30

## 2021-08-27 MED ORDER — MAGNESIUM SULFATE 2 GM/50ML IV SOLN
2.0000 g | Freq: Once | INTRAVENOUS | Status: AC
  Administered 2021-08-27: 2 g via INTRAVENOUS
  Filled 2021-08-27: qty 50

## 2021-08-27 MED ORDER — POTASSIUM CHLORIDE 10 MEQ/100ML IV SOLN: 10.0000 meq | INTRAVENOUS | Status: DC

## 2021-08-27 MED ORDER — ADULT MULTIVITAMIN W/MINERALS CH
1.0000 | ORAL_TABLET | Freq: Every day | ORAL | Status: DC
  Administered 2021-08-27 – 2021-09-02 (×5): 1 via ORAL
  Filled 2021-08-27 (×6): qty 1

## 2021-08-27 MED ORDER — ASPIRIN 81 MG PO CHEW
81.0000 mg | CHEWABLE_TABLET | ORAL | Status: AC
  Administered 2021-08-28: 81 mg via ORAL
  Filled 2021-08-27: qty 1

## 2021-08-27 MED ORDER — SODIUM CHLORIDE 0.9 % IV SOLN: INTRAVENOUS | Status: DC

## 2021-08-27 MED ORDER — POTASSIUM CHLORIDE CRYS ER 20 MEQ PO TBCR
40.0000 meq | EXTENDED_RELEASE_TABLET | ORAL | Status: AC
  Administered 2021-08-27 (×2): 40 meq via ORAL
  Filled 2021-08-27 (×2): qty 2

## 2021-08-27 MED ORDER — INSULIN ASPART 100 UNIT/ML IJ SOLN
0.0000 [IU] | Freq: Three times a day (TID) | INTRAMUSCULAR | Status: DC
  Administered 2021-08-28: 2 [IU] via SUBCUTANEOUS
  Administered 2021-08-28: 5 [IU] via SUBCUTANEOUS
  Administered 2021-08-30 – 2021-08-31 (×3): 2 [IU] via SUBCUTANEOUS
  Administered 2021-08-31: 3 [IU] via SUBCUTANEOUS
  Administered 2021-08-31: 2 [IU] via SUBCUTANEOUS

## 2021-08-27 MED ORDER — SPIRONOLACTONE 25 MG PO TABS
25.0000 mg | ORAL_TABLET | Freq: Every day | ORAL | Status: DC
Start: 2021-08-27 — End: 2021-09-03
  Administered 2021-08-27 – 2021-09-02 (×7): 25 mg via ORAL
  Filled 2021-08-27 (×7): qty 1

## 2021-08-27 MED ORDER — POTASSIUM CHLORIDE CRYS ER 20 MEQ PO TBCR: 40.0000 meq | EXTENDED_RELEASE_TABLET | Freq: Two times a day (BID) | ORAL | Status: DC

## 2021-08-27 MED ORDER — SODIUM CHLORIDE 0.9% FLUSH
3.0000 mL | Freq: Two times a day (BID) | INTRAVENOUS | Status: DC
  Administered 2021-08-27 – 2021-08-28 (×3): 3 mL via INTRAVENOUS
  Administered 2021-08-29: 10:00:00 10 mL via INTRAVENOUS
  Administered 2021-08-30 – 2021-09-02 (×4): 3 mL via INTRAVENOUS

## 2021-08-27 MED ORDER — SODIUM CHLORIDE 0.9 % IV SOLN: 250.0000 mL | INTRAVENOUS | Status: DC | PRN

## 2021-08-27 MED ORDER — INSULIN ASPART 100 UNIT/ML IJ SOLN: 0.0000 [IU] | Freq: Every day | INTRAMUSCULAR | Status: DC

## 2021-08-27 MED ORDER — POTASSIUM CHLORIDE 10 MEQ/50ML IV SOLN
10.0000 meq | INTRAVENOUS | Status: AC
  Administered 2021-08-27 (×4): 10 meq via INTRAVENOUS
  Filled 2021-08-27: qty 50

## 2021-08-27 MED ORDER — POTASSIUM CHLORIDE CRYS ER 20 MEQ PO TBCR
40.0000 meq | EXTENDED_RELEASE_TABLET | Freq: Once | ORAL | Status: AC
  Administered 2021-08-27: 40 meq via ORAL
  Filled 2021-08-27: qty 2

## 2021-08-27 NOTE — TOC Initial Note (Signed)
Transition of Care (TOC) - Initial/Assessment Note  ? ? ?Patient Details  ?Name: [name] ?MRN: [id number] ?Date of Birth: July 02, 1970 ? ?Transition of Care [facility]) CM/SW Contact:    ?Marcheta Grammes Rexene Alberts, RN ?Phone Number: [phone] ?08/27/2021, 4:04 PM ? ?Clinical Narrative:                 ? ?HF TOC CM spoke to pt at bedside. States she was independent at home. Reports she does not work. Husband able to assist with transportation. CM/CSW will continue to follow for dc needs. She has a new PCP, scheduled to follow up on 3/28, Eugenia Pancoast MD.  ? ? ? ?Expected Discharge Plan: [place] ?Barriers to Discharge: Continued Medical Work up ? ? ?Patient Goals and CMS Choice ?Patient states their goals for this hospitalization and ongoing recovery are:: patient states she wants to get stronger and well ?CMS Medicare.gov Compare Post Acute Care list provided to:: Patient ?  ? ?Expected Discharge Plan and Services ?Expected Discharge Plan: [place] ?  ?Discharge Planning Services: CM Consult ?  ?Living arrangements for the past 2 months: [place] ?                ?  ?  ?  ?  ?  ?  ?  ?  ?  ?  ? ?Prior Living Arrangements/Services ?Living arrangements for the past 2 months: [place] ?Lives with:: Spouse ?Patient language and need for interpreter reviewed:: Yes ?Do you feel safe going back to the place where you live?: Yes      ?Need for Family Participation in Patient Care: Yes (Comment) ?Care giver support system in place?: Yes (comment) ?  ?Criminal Activity/Legal Involvement Pertinent to Current Situation/Hospitalization: No - Comment as needed ? ?Activities of Daily Living ?  ?  ? ?Permission Sought/Granted ?Permission sought to share information with : Case Manager, Family Supports, PCP ?Permission granted to share information with : Yes, Verbal Permission Granted ? Share Information with NAME: [name] ? Permission granted to share info w AGENCY: IP rehab, Home health,  DME ? Permission granted to share info w Relationship: husband ? Permission granted to share info w Contact Information: [phone] ? ?Emotional Assessment ?Appearance:: Appears younger than stated age ?Attitude/Demeanor/Rapport: Gracious ?Affect (typically observed): Accepting ?Orientation: : Oriented to Self, Oriented to Place, Oriented to  Time, Oriented to Situation ?  ?Psych Involvement: No (comment) ? ?Admission diagnosis:  Decompensated heart failure ([place]) [I50.9] ?Patient Active Problem List  ? Diagnosis Date Noted  ? Decompensated heart failure ([place]) 08/26/2021  ? Acute CHF (congestive heart failure) ([place]) 07/18/2021  ? 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  ? Stroke (cerebrum) ([employer]) 04/17/2020  ? 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 ICD (implantable cardioverter-defibrillator) discharge 09/27/2018  ? Vitamin D deficiency 10/19/2017  ? Insulin resistance 10/19/2017  ? Hypotension 06/03/2016  ? Volume depletion 06/03/2016  ? Preoperative cardiovascular examination 10/25/2014  ? Hypothyroidism 04/26/2014  ? Pulmonary hypertension ([place]) 02/07/2014  ? Sprint Fidelis ICD lead RECALL  [phone]   ? UARS (upper airway resistance syndrome) 09/04/2011  ? Encounter for fitting or adjustment of implantable cardioverter-defibrillator (ICD)   ? Obesity 03/12/2009  ? NICM (nonischemic cardiomyopathy) ([place]) 03/12/2009  ? SYSTOLIC HEART FAILURE, CHRONIC 03/12/2009  ? ?PCP:  Patient, No Pcp Per (Inactive) ?Pharmacy:   ?[facility] [place] [address] ?[place] [address] ?Phone: [phone] Fax: [phone] ? ?[facility] ?[address] ?[place] [address] ?Phone: [phone] Fax: [phone] ? ? ? ? ?Social Determinants of Health (SDOH) Interventions ?  ? ?Readmission Risk Interventions ?Readmission Risk Prevention Plan 07/19/2021 04/20/2020  ?Transportation Screening Complete Complete  ?PCP or  Specialist Appt within 5-7 Days Complete Complete  ?Home Care Screening Complete Complete  ?Medication Review (RN CM) Complete Complete  ?Some recent data might be hidden  ? ? ? ?

## 2021-08-27 NOTE — Progress Notes (Signed)
?  Reviewed case with Dr. [name].  ? ?No plans to add mexitil at this time.  ? ?Continue IV amiodarone while on milrinone. NSVT frequency has improved somewhat reviewing tele overnight.  ? ?Note plans for [facility] tomorrow and possible [place] later in week.  ? ?Dr. [name] to see in the am.  ? ?Lollie Marrow, PA-C  ?08/27/2021 8:45 AM  ?

## 2021-08-27 NOTE — Progress Notes (Signed)
CSW met with patient for meet n greet and to discuss LVAD Psychosocial assessment. Patient identified her husband as the primary caregiver and will tentatively meet tomorrow afternoon to complete assessment. Raquel Sarna, [place], [place] ? ?

## 2021-08-27 NOTE — Progress Notes (Addendum)
PROGRESS NOTE    DEZAREE TRACEY  NOI:[id number] DOB: 1971/03/15 DOA: 08/25/2021 PCP: Patient, No Pcp Per (Inactive)  CHARRON COULTAS is an 51 y.o. 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 -Felt to be in low output heart failure started on milrinone and Lasix gtt. also started on amiodarone gtt. for VT VF suppression, frequent NSVT -3/6 labs noted undetectable TSH, free T4 elevated at 5.1, T3 220, TRH consulted for this   Subjective:feels  a bit better today, no further diarrhea, was able to take a shower today   Assessment and Plan:  Acute on chronic systolic CHF Severe MR Low output heart failure -Last echo 4/22 EF 25% with severe LV dilation -Now on Lasix gtt., milrinone gtt. -Coreg, Entresto on hold -LVAD being considered   Amiodarone induced hyperthyroidism/thyrotoxicosis -Long history of mild hypothyroidism on levothyroxine 25 mcg, and chronic amiodarone therapy  -Labs with concern for acute severe hyperthyroidism/thyrotoxicosis -TSH undetectable was normal 1 month ago, free T4 elevated at 5, T3 elevated at 220 -I called and discussed with endocrinology, since she could have type I or type II amiodarone induced thyrotoxicosis she was started on prednisone 20 Mg daily along with methimazole 10 Mg in AM and 5 mg every evening, -She will need close endocrinology follow-up in 2 to 3 weeks with repeat labs, and then attempt to taper prednisone and adjust methimazole -not much further to add from medical side, we will sign off please contact us if needed -I will send an urgent endocrinology referral   History of VT/VF/frequent PVCs -Has ICD, on amnio gtt. while on milrinone -Currently on amiodarone gtt. and p.o. quinidine  Ovarian cystic  mass -Noted on imaging, will need GYN follow-up    Domenic Polite, MD   Objective: Vitals:   08/27/21 1000 08/27/21 1100 08/27/21 1200 08/27/21 1300  BP: 114/78 (!) 85/51 94/79 105/89  Pulse: (!) 120 (!) 118 (!) 123 (!) 124  Resp: (!) 23 (!) 24 17 (!) 23  Temp:      TempSrc:      SpO2: 96% 97% 99% 100%  Weight:        Intake/Output Summary (Last 24 hours) at 08/27/2021 1454 Last data filed at 08/27/2021 1300 Gross per 24 hour  Intake 1409.46 ml  Output 3850 ml  Net -2440.54 ml   Filed Weights   08/26/21 0243 08/27/21 0500  Weight: 104.5 kg 101 kg    Examination:  General exam: Chronically ill female sitting up in bed, AAO x3 HEENT: Positive JVD CVS: S1-S2, regular rhythm, systolic murmur Lungs: Clear, decreased at the bases Abdomen: Soft, obese, nontender, bowel sounds present Extremities: Trace edema  PICC line in right upper extremity  Psychiatry: Mood & affect appropriate.     Data Reviewed:   CBC: Recent Labs  Lab 08/25/21 1728 08/25/21 2250 08/26/21 0137  WBC 10.1  --  11.0*  HGB 11.4* 11.2* 10.4*  HCT 37.9 33.0* 34.1*  MCV 73.6*  --  71.8*  PLT 401*  --  694   Basic Metabolic Panel: Recent Labs  Lab 08/21/21 1208 08/25/21 1728 08/25/21 1914 08/25/21 2250 08/26/21 0137 08/27/21 0405  NA 135 135  --  138 136 132*  K 3.5 4.0  --  3.9 3.8 2.9*  CL 104 105  --   --  106 98  CO2 22 19*  --   --  18* 24  GLUCOSE 103* 115*  --   --  106* 197*  BUN 19 20  --   --  22* 14  CREATININE 1.30* 1.50*  --   --  1.46* 1.24*  CALCIUM 9.1 9.3  --   --  9.2 8.6*  MG  --   --  2.0  --  2.0 1.9   GFR: Estimated Creatinine Clearance: 61.5 mL/min (A) (by C-G formula based on SCr of 1.24 mg/dL (H)). Liver Function Tests: Recent Labs  Lab 08/25/21 1914 08/26/21 0137  AST 29 32  ALT 38 39  ALKPHOS 86 84  BILITOT 0.7 0.5  PROT 6.6 6.4*  ALBUMIN 3.1* 3.0*   No results for input(s): LIPASE, AMYLASE in the last 168 hours. No results for input(s):  AMMONIA in the last 168 hours. Coagulation Profile: No results for input(s): INR, PROTIME in the last 168 hours. Cardiac Enzymes: No results for input(s): CKTOTAL, CKMB, CKMBINDEX, TROPONINI in the last 168 hours. BNP (last 3 results) No results for input(s): PROBNP in the last 8760 hours. HbA1C: Recent Labs    08/27/21 0907  HGBA1C 5.1   CBG: Recent Labs  Lab 08/27/21 1246  GLUCAP 121*   Lipid Profile: Recent Labs    08/26/21 1720  CHOL 100  HDL 26*  LDLCALC 55  TRIG 95  CHOLHDL 3.8   Thyroid Function Tests: Recent Labs    08/26/21 0137  TSH <0.010*  FREET4 5.11*   Anemia Panel: Recent Labs    08/26/21 0137  FERRITIN 12  TIBC 377  IRON 32   Urine analysis:    Component Value Date/Time   COLORURINE YELLOW 08/26/2021 0636   APPEARANCEUR CLEAR 08/26/2021 0636   APPEARANCEUR Turbid 09/19/2013 1635   LABSPEC 1.025 08/26/2021 0636   LABSPEC 1.027 09/19/2013 1635   PHURINE 5.0 08/26/2021 0636   GLUCOSEU NEGATIVE 08/26/2021 0636   GLUCOSEU Negative 09/19/2013 1635   HGBUR NEGATIVE 08/26/2021 0636   BILIRUBINUR NEGATIVE 08/26/2021 0636   BILIRUBINUR Negative 09/19/2013 1635   KETONESUR NEGATIVE 08/26/2021 0636   PROTEINUR NEGATIVE 08/26/2021 0636   NITRITE NEGATIVE 08/26/2021 0636   LEUKOCYTESUR NEGATIVE 08/26/2021 0636   LEUKOCYTESUR Negative 09/19/2013 1635   Sepsis Labs: '@LABRCNTIP'$ (procalcitonin:4,lacticidven:4)  ) Recent Results (from the past 240 hour(s))  Resp Panel by RT-PCR (Flu A&B, Covid) Nasopharyngeal Swab     Status: None   Collection Time: 08/25/21 10:35 PM   Specimen: Nasopharyngeal Swab; Nasopharyngeal(NP) swabs in vial transport medium  Result Value Ref Range Status   SARS Coronavirus 2 by RT PCR NEGATIVE NEGATIVE Final    Comment: (NOTE) SARS-CoV-2 target nucleic acids are NOT DETECTED.  The SARS-CoV-2 RNA is generally detectable in upper respiratory specimens during the acute phase of infection. The lowest concentration of  SARS-CoV-2 viral copies this assay can detect is 138 copies/mL. A negative result does not preclude SARS-Cov-2 infection and should not be used as the sole basis for treatment or other patient management decisions. A negative result may occur with  improper specimen collection/handling, submission of specimen other than nasopharyngeal swab, presence of viral mutation(s) within the areas targeted by this assay, and inadequate number of viral copies(<138 copies/mL). A negative result must be combined with clinical observations, patient history, and epidemiological information. The expected result is Negative.  Fact Sheet for Patients:  EntrepreneurPulse.com.au  Fact Sheet for Healthcare Providers:  IncredibleEmployment.be  This test is no t yet approved or  cleared by the [place] and  has been authorized for detection and/or diagnosis of SARS-CoV-2 by FDA under an Emergency Use Authorization (EUA). This EUA will remain  in effect (meaning this test can be used) for the duration of the COVID-19 declaration under Section 564(b)(1) of the Act, 21 U.S.C.section 360bbb-3(b)(1), unless the authorization is terminated  or revoked sooner.       Influenza A by PCR NEGATIVE NEGATIVE Final   Influenza B by PCR NEGATIVE NEGATIVE Final    Comment: (NOTE) The Xpert Xpress SARS-CoV-2/FLU/RSV plus assay is intended as an aid in the diagnosis of influenza from Nasopharyngeal swab specimens and should not be used as a sole basis for treatment. Nasal washings and aspirates are unacceptable for Xpert Xpress SARS-CoV-2/FLU/RSV testing.  Fact Sheet for Patients: EntrepreneurPulse.com.au  Fact Sheet for Healthcare Providers: IncredibleEmployment.be  This test is not yet approved or cleared by the [place] FDA and has been authorized for detection and/or diagnosis of SARS-CoV-2 by FDA under an Emergency Use  Authorization (EUA). This EUA will remain in effect (meaning this test can be used) for the duration of the COVID-19 declaration under Section 564(b)(1) of the Act, 21 U.S.C. section 360bbb-3(b)(1), unless the authorization is terminated or revoked.  Performed at [facility], [place] [address] [place], [place] [address]   MRSA Next Gen by PCR, Nasal     Status: None   Collection Time: 08/26/21  2:24 AM   Specimen: Nasal Mucosa; Nasal Swab  Result Value Ref Range Status   MRSA by PCR Next Gen NOT DETECTED NOT DETECTED Final    Comment: (NOTE) The GeneXpert MRSA Assay (FDA approved for NASAL specimens only), is one component of a comprehensive MRSA colonization surveillance program. It is not intended to diagnose MRSA infection nor to guide or monitor treatment for MRSA infections. Test performance is not FDA approved in patients less than 56 years old. Performed at [facility], [place] [address] [place], [place] [address]      Radiology Studies: CT ABDOMEN PELVIS WO CONTRAST  Result Date: 08/27/2021 CLINICAL DATA:  51 year old female undergoing evaluation prior to potential LVAD placement. EXAM: CT ABDOMEN AND PELVIS WITHOUT CONTRAST TECHNIQUE: Multidetector CT imaging of the abdomen and pelvis was performed following the standard protocol without IV contrast. RADIATION DOSE REDUCTION: This exam was performed according to the departmental dose-optimization program which includes automated exposure control, adjustment of the mA and/or kV according to patient size and/or use of iterative reconstruction technique. COMPARISON:  No priors. FINDINGS: Lower chest: Patchy areas of ground-glass attenuation and mild interlobular septal thickening in the visualize lung bases, likely reflective of a background of mild interstitial pulmonary edema. Cardiomegaly with left atrial dilatation. Pacemaker/AICD lead terminating in the right ventricular apex. Hepatobiliary: No definite suspicious  cystic or solid hepatic lesions are confidently identified on today's noncontrast CT examination. Amorphous intermediate to high attenuation material lying dependently in the gallbladder lumen, compatible with biliary sludge. No findings to suggest an acute cholecystitis are noted at this time. Pancreas: No definite pancreatic mass or peripancreatic fluid collections or inflammatory changes are noted on today's noncontrast CT examination. Spleen: Unremarkable. Adrenals/Urinary Tract: 5 mm nonobstructive calculus in the lower pole collecting system of the right kidney. Unenhanced appearance of the left kidney and bilateral adrenal glands is otherwise unremarkable. No hydroureteronephrosis. Urinary bladder is normal in appearance. Stomach/Bowel: Unenhanced appearance of the stomach is normal. There is no pathologic dilatation of small bowel or colon. Normal appendix. Vascular/Lymphatic:  No atherosclerotic calcifications are noted in the abdominal aorta or pelvic vasculature. No lymphadenopathy noted in the abdomen or pelvis. Reproductive: Uterus and right ovary are unremarkable in appearance. In the left ovary there is a large 11.7 x 4.3 x 7.4 cm low-attenuation (3 HU) mass (axial image 65 of series 3 and sagittal image 89 of series 7). Other: No abnormal structures identified in the left upper quadrant of the abdomen to preclude LVAD placement. No significant volume of ascites. No pneumoperitoneum. Musculoskeletal: There are no aggressive appearing lytic or blastic lesions noted in the visualized portions of the skeleton. IMPRESSION: 1. No unexpected findings to preclude LVAD placement. 2. Large cystic mass associated with the left ovary. Further evaluation with pelvic ultrasound is recommended at this time to exclude the possibility of ovarian malignancy. 3. Cardiomegaly. 4. Evidence of mild interstitial pulmonary edema in the lung bases, which could suggest congestive heart failure. 5. Biliary sludge in the  gallbladder. No findings to suggest an acute cholecystitis at this time. 6. 5 mm nonobstructive calculus in the lower pole collecting system of the right kidney. No ureteral stones or findings of urinary tract obstruction are noted at this time. Electronically Signed   By: Vinnie Langton M.D.   On: 08/27/2021 05:15   DG Orthopantogram  Result Date: 08/27/2021 CLINICAL DATA:  Screening prior to possible cardiac surgery. No chest complaints. EXAM: ORTHOPANTOGRAM/PANORAMIC COMPARISON:  None. FINDINGS: Normal appearing teeth without caries or periapical lucency. IMPRESSION: 1. Normal. Electronically Signed   By: Titus Dubin M.D.   On: 08/27/2021 12:55   DG Chest 2 View  Result Date: 08/25/2021 CLINICAL DATA:  Shortness of breath EXAM: CHEST - 2 VIEW COMPARISON:  07/20/2021 FINDINGS: Gross cardiomegaly. Left chest multi lead pacer defibrillator. Both lungs are clear. The visualized skeletal structures are unremarkable. IMPRESSION: Gross cardiomegaly without acute abnormality of the lungs. Electronically Signed   By: Delanna Ahmadi M.D.   On: 08/25/2021 18:49   CT Angio Chest PE W and/or Wo Contrast  Result Date: 08/25/2021 CLINICAL DATA:  Elevated D-dimer and shortness of breath EXAM: CT ANGIOGRAPHY CHEST WITH CONTRAST TECHNIQUE: Multidetector CT imaging of the chest was performed using the standard protocol during bolus administration of intravenous contrast. Multiplanar CT image reconstructions and MIPs were obtained to evaluate the vascular anatomy. RADIATION DOSE REDUCTION: This exam was performed according to the departmental dose-optimization program which includes automated exposure control, adjustment of the mA and/or kV according to patient size and/or use of iterative reconstruction technique. CONTRAST:  24m OMNIPAQUE IOHEXOL 350 MG/ML SOLN COMPARISON:  Chest x-ray from earlier in the same day. FINDINGS: Cardiovascular: No aneurysmal dilatation of the aorta is noted. The aorta is incompletely  opacified. Cardiac enlargement is noted. Defibrillator is again noted and stable. Pulmonary artery is well visualized within normal branching pattern. No filling defect to suggest pulmonary embolism is noted. Mediastinum/Nodes: Thoracic inlet is well visualized and within normal limits. No sizable hilar or mediastinal adenopathy is noted. Small hilar lymph nodes are noted likely reactive in nature. The esophagus as visualized is within normal limits. Lungs/Pleura: Lungs are well aerated bilaterally. Minimal dependent atelectatic changes are seen. No effusion or parenchymal nodule is noted. Upper Abdomen: Visualized upper abdomen appears within normal limits. Musculoskeletal: No acute rib abnormality is noted. Mild degenerative changes of thoracic spine are seen. Review of the MIP images confirms the above findings. IMPRESSION: No evidence of pulmonary embolism. No acute abnormality is noted. Electronically Signed   By: MLinus MakoD.  On: 08/25/2021 21:23   DG CHEST PORT 1 VIEW  Result Date: 08/26/2021 CLINICAL DATA:  Central line placement. EXAM: PORTABLE CHEST 1 VIEW COMPARISON:  Chest x-ray dated August 25, 2021. FINDINGS: New right upper extremity PICC line with tip in the right brachiocephalic vein. Unchanged left chest wall pacemaker. Stable cardiomegaly. Normal pulmonary vascularity. No focal consolidation, pleural effusion, or pneumothorax. No acute osseous abnormality. IMPRESSION: 1. New right upper extremity PICC line with tip in the right brachiocephalic vein. 2. No active disease. Electronically Signed   By: Titus Dubin M.D.   On: 08/26/2021 13:12   ECHOCARDIOGRAM COMPLETE  Result Date: 08/26/2021    ECHOCARDIOGRAM REPORT   Patient Name:   SENTORIA BRENT Date of Exam: 08/26/2021 Medical Rec #:  [id number]    Height:       63.0 in Accession #:    [id number]   Weight:       230.4 lb Date of Birth:  1970/09/24    BSA:          2.053 m Patient Age:    31 years     BP:           105/77 mmHg Patient  Gender: F            HR:           111 bpm. Exam Location:  Inpatient Procedure: 2D Echo, Cardiac Doppler, Color Doppler, 3D Echo and Intracardiac            Opacification Agent Indications:    CHF-Acute Systolic C37.62  History:        Patient has prior history of Echocardiogram examinations, most                 recent 07/21/2021. Cardiomyopathy and CHF, Previous Myocardial                 Infarction, Arrythmias:Tachycardia; Risk Factors:Sleep Apnea and                 Hypertension.  Sonographer:    Bernadene Person RDCS Referring Phys: [id number] [place]  1. Left ventricular ejection fraction, by estimation, is <20%. The left ventricle has severely decreased function. The left ventricle demonstrates global hypokinesis. The left ventricular internal cavity size was severely dilated. No LV thrombus noted. Indeterminate diastolic filling due to E-A fusion.  2. Right ventricular systolic function is mildly reduced. The right ventricular size is normal. There is mildly elevated pulmonary artery systolic pressure. The estimated right ventricular systolic pressure is 16.0 mmHg.  3. Left atrial size was severely dilated.  4. Right atrial size was mildly dilated.  5. The mitral valve is abnormal. Moderate-severe mitral valve regurgitation, ERO 0.22 cm^2 by PISA but looks worse visually. Suspect functional MR. No evidence of mitral stenosis.  6. The aortic valve is tricuspid. Aortic valve regurgitation is not visualized. No aortic stenosis is present.  7. The inferior vena cava is dilated in size with <50% respiratory variability, suggesting right atrial pressure of 15 mmHg. FINDINGS  Left Ventricle: Left ventricular ejection fraction, by estimation, is <20%. The left ventricle has severely decreased function. The left ventricle demonstrates global hypokinesis. Definity contrast agent was given IV to delineate the left ventricular endocardial borders. The left ventricular internal cavity size was severely  dilated. There is no left ventricular hypertrophy. Indeterminate diastolic filling due to E-A fusion. Right Ventricle: The right ventricular size is normal. No increase in right ventricular wall thickness. Right ventricular systolic function is  mildly reduced. There is mildly elevated pulmonary artery systolic pressure. The tricuspid regurgitant velocity  is 2.64 m/s, and with an assumed right atrial pressure of 15 mmHg, the estimated right ventricular systolic pressure is 24.2 mmHg. Left Atrium: Left atrial size was severely dilated. Right Atrium: Right atrial size was mildly dilated. Pericardium: There is no evidence of pericardial effusion. Mitral Valve: The mitral valve is abnormal. Moderate to severe mitral valve regurgitation. No evidence of mitral valve stenosis. Tricuspid Valve: The tricuspid valve is normal in structure. Tricuspid valve regurgitation is mild. Aortic Valve: The aortic valve is tricuspid. Aortic valve regurgitation is not visualized. No aortic stenosis is present. Pulmonic Valve: The pulmonic valve was normal in structure. Pulmonic valve regurgitation is trivial. Aorta: The aortic root is normal in size and structure. Venous: The inferior vena cava is dilated in size with less than 50% respiratory variability, suggesting right atrial pressure of 15 mmHg. IAS/Shunts: No atrial level shunt detected by color flow Doppler. Additional Comments: A device lead is visualized in the right ventricle.  LEFT VENTRICLE PLAX 2D LVIDd:         7.50 cm LVIDs:         6.90 cm LV PW:         0.80 cm LV IVS:        0.90 cm LVOT diam:     2.20 cm      3D Volume EF: LV SV:         32           3D EF:        19 % LV SV Index:   15           LV EDV:       374 ml LVOT Area:     3.80 cm     LV ESV:       303 ml                             LV SV:        71 ml  LV Volumes (MOD) LV vol d, MOD A2C: 249.0 ml LV vol d, MOD A4C: 237.0 ml LV vol s, MOD A2C: 212.0 ml LV vol s, MOD A4C: 216.0 ml LV SV MOD A2C:     37.0 ml LV  SV MOD A4C:     237.0 ml LV SV MOD BP:      23.9 ml RIGHT VENTRICLE RV S prime:     12.20 cm/s TAPSE (M-mode): 2.4 cm LEFT ATRIUM              Index        RIGHT ATRIUM           Index LA diam:        5.50 cm  2.68 cm/m   RA Area:     18.00 cm LA Vol (A2C):   127.0 ml 61.85 ml/m  RA Volume:   43.30 ml  21.09 ml/m LA Vol (A4C):   137.0 ml 66.72 ml/m LA Biplane Vol: 135.0 ml 65.74 ml/m  AORTIC VALVE LVOT Vmax:   71.00 cm/s LVOT Vmean:  45.450 cm/s LVOT VTI:    0.083 m  AORTA Ao Root diam: 2.80 cm Ao Asc diam:  2.80 cm MR Peak grad:    81.0 mmHg    TRICUSPID VALVE MR Mean grad:    49.5 mmHg    TR Peak grad:  27.9 mmHg MR Vmax:         450.00 cm/s  TR Vmax:        264.00 cm/s MR Vmean:        325.5 cm/s MR PISA:         2.65 cm     SHUNTS MR PISA Eff ROA: 20 mm       Systemic VTI:  0.08 m MR PISA Radius:  0.65 cm      Systemic Diam: 2.20 cm Dalton McleanMD Electronically signed by Franki Monte Signature Date/Time: 08/26/2021/2:54:38 PM    Final    US THYROID  Result Date: 08/27/2021 CLINICAL DATA:  Hyperthyroidism. EXAM: THYROID ULTRASOUND TECHNIQUE: Ultrasound examination of the thyroid gland and adjacent soft tissues was performed. COMPARISON:  Chest CT 08/25/2021 FINDINGS: Parenchymal Echotexture: Moderately heterogenous Isthmus: 0.3 cm Right lobe: 5.2 x 2.3 x 2.1 cm Left lobe: Absent _________________________________________________________ Estimated total number of nodules >/= 1 cm: 0 Number of spongiform nodules >/=  2 cm not described below (TR1): 0 Number of mixed cystic and solid nodules >/= 1.5 cm not described below (TR2): 0 _________________________________________________________ Right thyroid lobe is moderately heterogeneous with at least 2 small nodules, largest measuring 0.5 cm. These nodules do not meet criteria for biopsy or dedicated follow-up. No definite thyroid tissue in the expected region of the left thyroid lobe. 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 IMPRESSION: 1. Absent  left thyroid tissue. This is suggestive for hemiagenesis in the absence of surgical resection. 2. Thyroid tissue is heterogeneous with small nodules. These nodules do not meet criteria for biopsy or dedicated follow-up. The above is in keeping with the ACR TI-RADS recommendations - J Am Coll Radiol 2017;14:587-595. Electronically Signed   By: Markus Daft M.D.   On: 08/27/2021 08:18   [name] EKG SITE RITE  Result Date: 08/26/2021 If Site Rite image not attached, placement could not be confirmed due to current cardiac rhythm.  [name] EKG SITE RITE  Result Date: 08/26/2021 If Site Rite image not attached, placement could not be confirmed due to current cardiac rhythm.    Scheduled Meds:  (feeding supplement) PROSource Plus  30 mL Oral TID BM   aspirin EC  81 mg Oral Daily   Chlorhexidine Gluconate Cloth  6 each Topical Daily   dapagliflozin propanediol  10 mg Oral Daily   digoxin  0.0625 mg Oral Daily   heparin  5,000 Units Subcutaneous Q8H   insulin aspart  0-15 Units Subcutaneous TID WC   insulin aspart  0-5 Units Subcutaneous QHS   mouth rinse  15 mL Mouth Rinse BID   methimazole  10 mg Oral Daily   methimazole  5 mg Oral QPM   multivitamin with minerals  1 tablet Oral Daily   predniSONE  20 mg Oral Q breakfast   quiniDINE gluconate  324 mg Oral BID   rosuvastatin  40 mg Oral QHS   sodium chloride flush  10-40 mL Intracatheter Q12H   sodium chloride flush  3 mL Intravenous Q12H   sodium chloride flush  3 mL Intravenous Q12H   spironolactone  25 mg Oral Daily   Continuous Infusions:  sodium chloride     amiodarone 30 mg/hr (08/27/21 1300)   furosemide (LASIX) 200 mg in dextrose 5% 100 mL ('2mg'$ /mL) infusion 10 mg/hr (08/27/21 1300)   milrinone 0.25 mcg/kg/min (08/27/21 1300)     LOS: 1 day    Time spent: 13mn    PDomenic Polite MD [facility]   08/27/2021, 2:54  7:24 PM

## 2021-08-27 NOTE — Progress Notes (Signed)
Initial Nutrition Assessment ? ?DOCUMENTATION CODES:  ? ?Obesity unspecified ? ?INTERVENTION:  ? ?30 ml ProSource Plus TID, each supplement provides 100 kcals and 15 grams protein.  ? ?Liberalize diet to Regular, continue fluid restriction ? ?MVI with Minerals daily ? ?Encouraged protein intake; reviewed sources of protein, to include peanut butter, eggs, yogurt, chicken salad, etc. ? ?If pt unable to improve po intake, recommend considering Cortrak insertion with initiation of supplemental TF ? ?NUTRITION DIAGNOSIS:  ? ?Inadequate oral intake related to decreased appetite, early satiety, acute illness, chronic illness as evidenced by per patient/family report. ? ?GOAL:  ? ?Patient will meet greater than or equal to 90% of their needs ? ?MONITOR:  ? ?PO intake, Supplement acceptance, Labs, Weight trends ? ?REASON FOR ASSESSMENT:  ? ?Consult ?LVAD Eval ? ?ASSESSMENT:  ? ?51 yo female admitted with acute on chronic CHF. PMH includes NICM EF 20-25%, CKD 3, CVA, HTN, HLD ? ?3/06 Admitted ? ?Consult received for LVAD workup ?Noted plan for RHC with Swan placement tomorrow. Noted possible Impella 5.5 placement.  ? ?CT abdomen/pelvis with Left ovarian mass-plan for opelvic ultrasound to rule out ovarian malignancy ? ?TSH undetectable, free T4 5.11, levothyroxine discontinued.  ? ?Pt reports poor appetite for at least 1 month; eating very minimally due to early satiety from fluid gains, overall feeling poorly. Pt reports she has been eating little, mostly fruit like bananas and oranges. Pt acknolesges that she has not been eating protein.  ? ?Pt reports she does not like protein shakes. Reports however that she does not even think she could tolerate the volume as she vomits after most anything significant. Pt agreeable to trying protein modular.  ? ?Current wt 101 kg; unsure of dry weight.  ? ?Labs: sodium 132 (L), potassium 2.9 (L), Creatinine 1.24 ?Meds: lasix gtt, ss novolog, prednisone, KCl ? ? ?NUTRITION - FOCUSED  PHYSICAL EXAM: ? ?Unable to complete, pt leaving for testing ? ?Diet Order:   ?Diet Order   ? ?       ?  Diet regular Room service appropriate? Yes; Fluid consistency: Thin; Fluid restriction: 2000 mL Fluid  Diet effective now       ?  ? ?  ?  ? ?  ? ? ?EDUCATION NEEDS:  ? ?Education needs have been addressed ? ?Skin:  Skin Assessment: Reviewed RN Assessment ? ?Last BM:  no documented BM ? ?Height:  ? ?Ht Readings from Last 1 Encounters:  ?08/21/21 '5\' 3"'$  (1.6 m)  ? ? ?Weight:  ? ?Wt Readings from Last 1 Encounters:  ?08/27/21 101 kg  ? ? ? ? ?BMI:  Body mass index is 39.44 kg/m?. ? ?Estimated Nutritional Needs:  ? ?Kcal:  1800-2000 kcals ? ?Protein:  100-115 g ? ?Fluid:  2L ? ? ?Kerman Passey MS, RDN, LDN, CNSC ?Registered Dietitian III ?Clinical Nutrition ?RD Pager and On-Call Pager Number Located in [place]  ? ?

## 2021-08-27 NOTE — Progress Notes (Addendum)
Advanced Heart Failure Rounding Note  PCP-Cardiologist: [name], MD   Subjective:    3/6 Admitted w/ a/c systolic heart failure, NYHA Class IIIb-IV w/ low output, Started on empiric milrinone 0.25 + lasix gtt. Amiodarone gtt started for frequent NSVT. VAD W/u begun 3/6 Tx for hyperthyroidism. TSH undetectable. Free T4 5.11. Started on Methimazole + prednisone. Levothyroxine d/c   PICC placed yesterday. Initial co-ox, on 0.25 of milrinone, was low at 47%.   Today, remains on milrinone 0.25. Co-ox up to 55% today. 3.8L in UOP yesterday on lasix gtt at 12/hr. Wt down 8 lb. CVP 8-9. SCr improving, 1.46>>1.24   Less frequent NSVT on tele today.   K 2.9, replacement given Mg 1.9   OOB, sitting up in chair. Feeling a bit better. Breathing improved. Less palpitations.    Objective:   Weight Range: 101 kg Body mass index is 39.44 kg/m.   Vital Signs:   Temp:  [98.1 F (36.7 C)-98.3 F (36.8 C)] 98.2 F (36.8 C) (03/07 0400) Pulse Rate:  [102-125] 117 (03/07 0600) Resp:  [10-33] 26 (03/07 0600) BP: (90-124)/(62-107) 105/86 (03/07 0500) SpO2:  [98 %-100 %] 100 % (03/07 0600) Weight:  [101 kg] 101 kg (03/07 0500)    Weight change: Filed Weights   08/26/21 0243 08/27/21 0500  Weight: 104.5 kg 101 kg    Intake/Output:   Intake/Output Summary (Last 24 hours) at 08/27/2021 0716 Last data filed at 08/27/2021 0600 Gross per 24 hour  Intake 1223.34 ml  Output 3800 ml  Net -2576.66 ml      Physical Exam    CVP 8-9  General:  Well appearing. No respiratory difficulty HEENT: normal Neck: supple. JVD 9 cm. Carotids 2+ bilat; no bruits. No lymphadenopathy or thyromegaly appreciated. Cor: PMI nondisplaced. Regular rhythm, tachy rate. 3/6 MR murmur  Lungs: clear Abdomen: soft, nontender, nondistended. No hepatosplenomegaly. No bruits or masses. Good bowel sounds. Extremities: no cyanosis, clubbing, rash, trace b/l L edema, LEs cool + RUE PICC  Neuro: alert & oriented x  3, cranial nerves grossly intact. moves all 4 extremities w/o difficulty. Affect pleasant.  Telemetry   Sinus tach, 110s, brief NSVT, less frequent than yesterday.   EKG    No new EKG to review   Labs    CBC Recent Labs    08/25/21 1728 08/25/21 2250 08/26/21 0137  WBC 10.1  --  11.0*  HGB 11.4* 11.2* 10.4*  HCT 37.9 33.0* 34.1*  MCV 73.6*  --  71.8*  PLT 401*  --  676   Basic Metabolic Panel Recent Labs    08/26/21 0137 08/27/21 0405  NA 136 132*  K 3.8 2.9*  CL 106 98  CO2 18* 24  GLUCOSE 106* 197*  BUN 22* 14  CREATININE 1.46* 1.24*  CALCIUM 9.2 8.6*  MG 2.0 1.9   Liver Function Tests Recent Labs    08/25/21 1914 08/26/21 0137  AST 29 32  ALT 38 39  ALKPHOS 86 84  BILITOT 0.7 0.5  PROT 6.6 6.4*  ALBUMIN 3.1* 3.0*   No results for input(s): LIPASE, AMYLASE in the last 72 hours. Cardiac Enzymes No results for input(s): CKTOTAL, CKMB, CKMBINDEX, TROPONINI in the last 72 hours.  BNP: BNP (last 3 results) Recent Labs    07/18/21 1647 08/21/21 1208 08/25/21 1728  BNP 1,331.6* 957.7* 1,117.0*    ProBNP (last 3 results) No results for input(s): PROBNP in the last 8760 hours.   D-Dimer Recent Labs  08/25/21 1914  DDIMER 1.32*   Hemoglobin A1C No results for input(s): HGBA1C in the last 72 hours. Fasting Lipid Panel Recent Labs    08/26/21 1720  CHOL 100  HDL 26*  LDLCALC 55  TRIG 95  CHOLHDL 3.8   Thyroid Function Tests Recent Labs    08/26/21 0137  TSH <0.010*    Other results:   Imaging    CT ABDOMEN PELVIS WO CONTRAST  Result Date: 08/27/2021 CLINICAL DATA:  51 year old female undergoing evaluation prior to potential LVAD placement. EXAM: CT ABDOMEN AND PELVIS WITHOUT CONTRAST TECHNIQUE: Multidetector CT imaging of the abdomen and pelvis was performed following the standard protocol without IV contrast. RADIATION DOSE REDUCTION: This exam was performed according to the departmental dose-optimization program which  includes automated exposure control, adjustment of the mA and/or kV according to patient size and/or use of iterative reconstruction technique. COMPARISON:  No priors. FINDINGS: Lower chest: Patchy areas of ground-glass attenuation and mild interlobular septal thickening in the visualize lung bases, likely reflective of a background of mild interstitial pulmonary edema. Cardiomegaly with left atrial dilatation. Pacemaker/AICD lead terminating in the right ventricular apex. Hepatobiliary: No definite suspicious cystic or solid hepatic lesions are confidently identified on today's noncontrast CT examination. Amorphous intermediate to high attenuation material lying dependently in the gallbladder lumen, compatible with biliary sludge. No findings to suggest an acute cholecystitis are noted at this time. Pancreas: No definite pancreatic mass or peripancreatic fluid collections or inflammatory changes are noted on today's noncontrast CT examination. Spleen: Unremarkable. Adrenals/Urinary Tract: 5 mm nonobstructive calculus in the lower pole collecting system of the right kidney. Unenhanced appearance of the left kidney and bilateral adrenal glands is otherwise unremarkable. No hydroureteronephrosis. Urinary bladder is normal in appearance. Stomach/Bowel: Unenhanced appearance of the stomach is normal. There is no pathologic dilatation of small bowel or colon. Normal appendix. Vascular/Lymphatic: No atherosclerotic calcifications are noted in the abdominal aorta or pelvic vasculature. No lymphadenopathy noted in the abdomen or pelvis. Reproductive: Uterus and right ovary are unremarkable in appearance. In the left ovary there is a large 11.7 x 4.3 x 7.4 cm low-attenuation (3 HU) mass (axial image 65 of series 3 and sagittal image 89 of series 7). Other: No abnormal structures identified in the left upper quadrant of the abdomen to preclude LVAD placement. No significant volume of ascites. No pneumoperitoneum.  Musculoskeletal: There are no aggressive appearing lytic or blastic lesions noted in the visualized portions of the skeleton. IMPRESSION: 1. No unexpected findings to preclude LVAD placement. 2. Large cystic mass associated with the left ovary. Further evaluation with pelvic ultrasound is recommended at this time to exclude the possibility of ovarian malignancy. 3. Cardiomegaly. 4. Evidence of mild interstitial pulmonary edema in the lung bases, which could suggest congestive heart failure. 5. Biliary sludge in the gallbladder. No findings to suggest an acute cholecystitis at this time. 6. 5 mm nonobstructive calculus in the lower pole collecting system of the right kidney. No ureteral stones or findings of urinary tract obstruction are noted at this time. Electronically Signed   By: Vinnie Langton M.D.   On: 08/27/2021 05:15   DG CHEST PORT 1 VIEW  Result Date: 08/26/2021 CLINICAL DATA:  Central line placement. EXAM: PORTABLE CHEST 1 VIEW COMPARISON:  Chest x-ray dated August 25, 2021. FINDINGS: New right upper extremity PICC line with tip in the right brachiocephalic vein. Unchanged left chest wall pacemaker. Stable cardiomegaly. Normal pulmonary vascularity. No focal consolidation, pleural effusion, or pneumothorax. No acute osseous  abnormality. IMPRESSION: 1. New right upper extremity PICC line with tip in the right brachiocephalic vein. 2. No active disease. Electronically Signed   By: Titus Dubin M.D.   On: 08/26/2021 13:12   ECHOCARDIOGRAM COMPLETE  Result Date: 08/26/2021    ECHOCARDIOGRAM REPORT   Patient Name:   [name] Date of Exam: 08/26/2021 Medical Rec #:  [id number]    Height:       63.0 in Accession #:    [id number]   Weight:       230.4 lb Date of Birth:  Jun 09, 1971    BSA:          2.053 m Patient Age:    25 years     BP:           105/77 mmHg Patient Gender: F            HR:           111 bpm. Exam Location:  Inpatient Procedure: 2D Echo, Cardiac Doppler, Color Doppler, 3D Echo and  Intracardiac            Opacification Agent Indications:    CHF-Acute Systolic O84.16  History:        Patient has prior history of Echocardiogram examinations, most                 recent 07/21/2021. Cardiomyopathy and CHF, Previous Myocardial                 Infarction, Arrythmias:Tachycardia; Risk Factors:Sleep Apnea and                 Hypertension.  Sonographer:    Bernadene Person RDCS Referring Phys: [id number] [place]  1. Left ventricular ejection fraction, by estimation, is <20%. The left ventricle has severely decreased function. The left ventricle demonstrates global hypokinesis. The left ventricular internal cavity size was severely dilated. No LV thrombus noted. Indeterminate diastolic filling due to E-A fusion.  2. Right ventricular systolic function is mildly reduced. The right ventricular size is normal. There is mildly elevated pulmonary artery systolic pressure. The estimated right ventricular systolic pressure is 01.0 mmHg.  3. Left atrial size was severely dilated.  4. Right atrial size was mildly dilated.  5. The mitral valve is abnormal. Moderate-severe mitral valve regurgitation, ERO 0.22 cm^2 by PISA but looks worse visually. Suspect functional MR. No evidence of mitral stenosis.  6. The aortic valve is tricuspid. Aortic valve regurgitation is not visualized. No aortic stenosis is present.  7. The inferior vena cava is dilated in size with <50% respiratory variability, suggesting right atrial pressure of 15 mmHg. FINDINGS  Left Ventricle: Left ventricular ejection fraction, by estimation, is <20%. The left ventricle has severely decreased function. The left ventricle demonstrates global hypokinesis. Definity contrast agent was given IV to delineate the left ventricular endocardial borders. The left ventricular internal cavity size was severely dilated. There is no left ventricular hypertrophy. Indeterminate diastolic filling due to E-A fusion. Right Ventricle: The right  ventricular size is normal. No increase in right ventricular wall thickness. Right ventricular systolic function is mildly reduced. There is mildly elevated pulmonary artery systolic pressure. The tricuspid regurgitant velocity  is 2.64 m/s, and with an assumed right atrial pressure of 15 mmHg, the estimated right ventricular systolic pressure is 93.2 mmHg. Left Atrium: Left atrial size was severely dilated. Right Atrium: Right atrial size was mildly dilated. Pericardium: There is no evidence of pericardial effusion. Mitral Valve: The mitral valve is  abnormal. Moderate to severe mitral valve regurgitation. No evidence of mitral valve stenosis. Tricuspid Valve: The tricuspid valve is normal in structure. Tricuspid valve regurgitation is mild. Aortic Valve: The aortic valve is tricuspid. Aortic valve regurgitation is not visualized. No aortic stenosis is present. Pulmonic Valve: The pulmonic valve was normal in structure. Pulmonic valve regurgitation is trivial. Aorta: The aortic root is normal in size and structure. Venous: The inferior vena cava is dilated in size with less than 50% respiratory variability, suggesting right atrial pressure of 15 mmHg. IAS/Shunts: No atrial level shunt detected by color flow Doppler. Additional Comments: A device lead is visualized in the right ventricle.  LEFT VENTRICLE PLAX 2D LVIDd:         7.50 cm LVIDs:         6.90 cm LV PW:         0.80 cm LV IVS:        0.90 cm LVOT diam:     2.20 cm      3D Volume EF: LV SV:         32           3D EF:        19 % LV SV Index:   15           LV EDV:       374 ml LVOT Area:     3.80 cm     LV ESV:       303 ml                             LV SV:        71 ml  LV Volumes (MOD) LV vol d, MOD A2C: 249.0 ml LV vol d, MOD A4C: 237.0 ml LV vol s, MOD A2C: 212.0 ml LV vol s, MOD A4C: 216.0 ml LV SV MOD A2C:     37.0 ml LV SV MOD A4C:     237.0 ml LV SV MOD BP:      23.9 ml RIGHT VENTRICLE RV S prime:     12.20 cm/s TAPSE (M-mode): 2.4 cm LEFT ATRIUM               Index        RIGHT ATRIUM           Index LA diam:        5.50 cm  2.68 cm/m   RA Area:     18.00 cm LA Vol (A2C):   127.0 ml 61.85 ml/m  RA Volume:   43.30 ml  21.09 ml/m LA Vol (A4C):   137.0 ml 66.72 ml/m LA Biplane Vol: 135.0 ml 65.74 ml/m  AORTIC VALVE LVOT Vmax:   71.00 cm/s LVOT Vmean:  45.450 cm/s LVOT VTI:    0.083 m  AORTA Ao Root diam: 2.80 cm Ao Asc diam:  2.80 cm MR Peak grad:    81.0 mmHg    TRICUSPID VALVE MR Mean grad:    49.5 mmHg    TR Peak grad:   27.9 mmHg MR Vmax:         450.00 cm/s  TR Vmax:        264.00 cm/s MR Vmean:        325.5 cm/s MR PISA:         2.65 cm     SHUNTS MR PISA Eff ROA: 20 mm  Systemic VTI:  0.08 m MR PISA Radius:  0.65 cm      Systemic Diam: 2.20 cm Leanthony Rhett McleanMD Electronically signed by Franki Monte Signature Date/Time: 08/26/2021/2:54:38 PM    Final    [name] EKG SITE RITE  Result Date: 08/26/2021 If Site Rite image not attached, placement could not be confirmed due to current cardiac rhythm.    Medications:     Scheduled Medications:  aspirin EC  81 mg Oral Daily   Chlorhexidine Gluconate Cloth  6 each Topical Daily   dapagliflozin propanediol  10 mg Oral Daily   digoxin  0.0625 mg Oral Daily   heparin  5,000 Units Subcutaneous Q8H   mouth rinse  15 mL Mouth Rinse BID   methimazole  10 mg Oral Daily   methimazole  5 mg Oral QPM   potassium chloride  40 mEq Oral Q4H   predniSONE  20 mg Oral Q breakfast   quiniDINE gluconate  324 mg Oral BID   rosuvastatin  40 mg Oral QHS   sodium chloride flush  10-40 mL Intracatheter Q12H   sodium chloride flush  3 mL Intravenous Q12H   spironolactone  12.5 mg Oral Daily    Infusions:  sodium chloride     amiodarone 30 mg/hr (08/27/21 0600)   furosemide (LASIX) 200 mg in dextrose 5% 100 mL ('2mg'$ /mL) infusion 12 mg/hr (08/27/21 0600)   milrinone 0.25 mcg/kg/min (08/27/21 0655)   potassium chloride      PRN Medications: sodium chloride, acetaminophen, ondansetron (ZOFRAN) IV,  sodium chloride flush, sodium chloride flush    Patient Profile   KAYDEE MAGEL is a 51 y.o. 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 Started on empiric milrinone and lasix gtt.   Assessment/Plan    1. Acute on chronic systolic HF: Nonischemic cardiomyopathy.  She has a [employer] ICD. Cause for cardiomyopathy is uncertain, initially identified peri-partum (after son's birth), so cannot rule out peri-partum CMP.  On and off, she has had frequent PVCs.   RHC in 8/15 showed normal filling pressures and preserved cardiac output. Echo in 4/22 with EF 25%, severe LV dilation.  CPX (8/22) with mild-moderate HF limitation.  Echo this admission with EF < 20% with severe dilation, normal RV size with mildly decreased systolic function, moderate-severe functional MR with dilated IVC. She was admitted with volume overload, NYHA class IV symptoms and low output. HCO3 18 and creatinine 1.5 on admit. Milrinone 0.25 started empirically + lasix gtt. PICC placed, initial co-ox on milrinone was low at 47%.  I suspect hyperthyroidism is playing a role in her CHF exacerbation. Today, she remains on milrinone 0.25. Co-ox 55%. Diuresing well w/ IV Lasix. Wt down 8 lb. CVP down to 8-9. SCr improving, 1.5>>1.2.  - Continue milrinone 0.25 for now - Continue lasix gtt at 12 mg/hr. Aggressively supp K  - Hold Entresto for now with soft BP.  - Hold Coreg with marked volume overload.     - Increase spironolactone to 25 mg daily.   - Continue digoxin 0.0625 daily (lower dose).  Level was too high with digoxin 0.125 and concomitant quinidine in the past, will check level on lower dose digoxin in 3 days.  - Continue dapagliflozin 10 mg daily.  - ECG with LBBB, QRS  140 msec => with QRS < 150, Dr. [name]  has not recommended CRT upgrade in the past.  - Patient has had recurrent CHF exacerbations and we have had to cut back on her outpatient meds.  Concern for low output HF this admission.  She may be nearing the need for advanced therapies (LVAD).  BMI too high currently for transplant (40.8).  Will see how she does with milrinone/diuresis. Aim for RHC w/ Swan placement tomorrow. ?Impella 5.5.  - She is a candidate for BAT, but concerned at this point we may be nearing LVAD.  2. Pulmonary HTN: Mixed picture with PVR 8.1 on RHC at [facility].  However, repeat study in 8/15 here showed no significant pulmonary arterial hypertension. 2017 RHC with pulmonary venous hypertension.  3. Morbid obesity: BMI still appears out of range for heart transplant.  4. 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  She has been on Levoxyl at home but does not think she has taken for a month.  Now hyperthyroid.  This likely contributes to her CHF exacerbation. I asked [facility] to evaluate her for this. NSVT on milrinone makes it difficult for [name] to stop amiodarone.  - Stop Levoxyl.  - Started on prednisone 20 mg daily and methimazole bid.  - Will need outpatient endocrinology followup.  - As above, continuing amiodrone for now.  5. PVCs: 11% PVCs + NSVT on 2/20 Zio patch.  Amiodarone started, but PVCs up to 13.3% with NSVT on 3/20 Zio patch. She saw Dr. [name] who thought that the ventricular arrhythmias were electromechanical due to worsening HF.  In 4/20, she had VT x 2 with ICD discharges in the setting of hypokalemia. Zio patch in 1/21 with PVCs down to 6.2% of beats. VT again in 2/22, quinidine started.  Initially w/ frequent NSVT on milrinone gtt. Started on amio gtt, less ectopy today.  - Continue amiodarone gtt. - Continue quinidine.  - EP consulted given complicated situation with hyperthyroidism and NSVT. They favor continuing amiodarone and quinadine at this time. No additional AAD  therapy  6. CVA: CVA in 10/21, had thrombectomy with stent placement.  No LV thrombus noted and no atrial fibrillation noted.  - Talked with interventional radiology. >1 year post-stent, ok to stop ticagrelor and continue ASA.  7. Fe deficiency: She reports heavy periods.  Transferrin saturation low.  - Feraheme given 3/6.  8. CKD stage 3: Creatinine 1.5 on admit. Down to 1.2 today. Follow with milrinone and diuresis.  9. Mitral regurgitation: Moderate-severe functional MR.  Unlikely to be Mitraclip candidate with severely dilated LV (>7 cm).  10. Left ovarian mass: Cystic left ovarian mass noted on CT abdomen/pelvis.  This needs workup for consideration of ovarian malignancy, will order pelvic ultrasound.   Length of Stay: 1  Lyda Jester, PA-C  08/27/2021, 7:16 AM  Advanced Heart Failure Team Pager [phone] (M-F; 7a - 5p)  Please contact [facility] for night-coverage after hours (5p -7a ) and weekends on [email or web address]  Patient seen with PA, agree with the above note.   She feels better today, diuresed well and down 8 lbs. Creatinine lower at 1.24.  I/Os net negative -2577 with JVP 8-9 cm this morning. Co-ox 47% => 55% on milrinone 0.25 mcg/kg/min.   General: NAD Neck: JVP 8 cm, no thyromegaly or thyroid nodule.  Lungs: Clear to auscultation bilaterally with normal respiratory effort. CV: Nondisplaced PMI.  Heart tachy, regular S1/S2, +S3, no murmur.  No peripheral edema.  No  carotid bruit.  Normal pedal pulses.  Abdomen: Soft, nontender, no hepatosplenomegaly, no distention.  Skin: Intact without lesions or rashes.  Neurologic: Alert and oriented x 3.  Psych: Normal affect. Extremities: No clubbing or cyanosis.  HEENT: Normal.   Patient feels better this morning but co-ox still on the low side at 55% despite milrinone 0.25.  Still with short NSVT runs though less frequent.  Creatinine trending down and CVP 8-9.  - Continue Lasix gtt today, decrease to 10 mg/hr and likely to po  tomorrow.  - Continue milrinone 0.25, co-ox not ideal but will not increase with NSVT.  - Increase spironolactone to 25 mg daily.  - Aggressive replacement of K and Mg with pm BMET.  - She has been declining over the last few months with CHF admissions and down-titration of meds.  Now with low output HF.  We are considering for LVAD.  Will discuss transplant with [facility] but think BMI is going to be prohibitive.  - Will take to cath lab for [facility] tomorrow.  Discussed with Dr. Cyndia Bent, potential Impella 5.5 placment later in week to be able to support her off milrinone.  - LVAD workup with CTs and PFTs needed.   Hyperthyroidism, unclear if she was actually taking Levoxyl at home.  - Appreciate [facility] assistance, now on prednisone 20 mg daily + methimazole.  - Will need SSI on prednisone.   Still with NSVT but seems less frequent.  Amiodarone gtt not ideal with hyperthyroidism but think we need for now.  Will also continue quinidine.  She has low output HF currently maintained on milrinone, ideally would wean off this and on to Impella 5.5 as above.   Cystic left ovarian mass noted on CT abdomen/pelvis.  This needs workup for consideration of ovarian malignancy, will order pelvic ultrasound.   CRITICAL CARE Performed by: [name]  Total critical care time: 40 minutes  Critical care time was exclusive of separately billable procedures and treating other patients.  Critical care was necessary to treat or prevent imminent or life-threatening deterioration.  Critical care was time spent personally by me on the following activities: development of treatment plan with patient and/or surrogate as well as nursing, discussions with consultants, evaluation of patient's response to treatment, examination of patient, obtaining history from patient or surrogate, ordering and performing treatments and interventions, ordering and review of laboratory studies, ordering and review of radiographic studies, pulse  oximetry and re-evaluation of patient's condition.  [name] 08/27/2021 8:06 AM

## 2021-08-27 NOTE — Consult Note (Cosign Needed)
Consultation Note Date: 08/27/2021   Patient Name: [name]  DOB: February 02, 1971  MRN: [id number]  Age / Sex: 51 y.o., female  PCP: Patient, No Pcp Per (Inactive) Referring Physician: Sanda Klein, MD  Reason for Consultation: Establishing goals of care and Psychosocial/spiritual support/LVAD assessment   HPI/Patient Profile: 51 y.o. female   admitted on 08/25/2021 with  [name] is a 51 y.o. 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  Evaluation in process for LVAD implantation.   Clinical Assessment and Goals of Care:  This NP Wadie Lessen reviewed medical records, received report from team, assessed the patient and then meet at the bedside with her husband [name] to discuss advanced directives and a preparedness plan in light of anticipated  LVAD implantation sometime in the near future.    A detailed discussion was had today regarding the concept of a preparedness plan as it relates to LVAD implantation and therapy    Patient and her husband were comfortable talking about the "what ifs" and the importance of today's conversation so both can have all the information to be full participants and to understand the patient's basic beliefs and wishes as it relates  to healthcare.  Created space and opportunity for patient and her husband to explore thoughts and feelings regarding current medical situation..  Both describe significant debility secondary to her cardiac history over the past many years.  Patient has not worked since outside the home since 2006.  Her husband describes the difficulties of her home care plan "trying to keep everything in line"; medication, fluids, labs and doctors  appointments.  Patient and her husband are somewhat overwhelmed with all the information and consideration for LVAD therapy presented to them in the last 24 hours.  Patient has 2 children; a daughter who is 66 years old and a son who is 2 years old.   Family live in [place].  Education offered specifically regarding concepts to advance care planning within the context of LVAD implantation; need for long-term ventilation, and artificial feeding and hydration, psychological adjustments were covered.   Patient was able to verbalize  the importance of quality of life.  [name] is hopeful that the LVAD procedure will increase her quality of life.   She and her husband are processing all the information being delivered to them as they try to understand the risks and benefits of LVAD procedure as it relates to the future of both the patient and the family.  At this time patient is open to all available medical interventions to prolong life and the success of the LVAD therapy. She shared and verbalized an understanding that  her husband would know when the burdens of treatment would outweigh the benefits and trust in his judgment at that time.    Education offered on advance care  planning specific to healthcare power of attorney and living will.[name] would like to secure healthcare power of attorney, I will consult spiritual care.  Patient and her husband were encouraged to continue conversation into the future as it is vital for the patient centered care.  No documented healthcare power of attorney or advanced care planning documents at this time.  We will ask spiritual care to help secure H POA.    SUMMARY OF RECOMMENDATIONS    Code Status/Advance Care Planning: Full code Patient and family are open to all offered and available medical interventions to prolong life, and to add to the success of LVAD therapy   Psycho-social/Spiritual:  Desire for further Chaplaincy  support:yes   Palliative medicine team will  continue to support holistically.       Primary Diagnoses: Present on Admission: **None**   I have reviewed the medical record, interviewed the patient and family, and examined the patient. The following aspects are pertinent.  Past Medical History:  Diagnosis Date   Automatic implantable cardioverter-defibrillator in situ    Chronic CHF (congestive heart failure) ([place])    a. EF 15-20% b. RHC (09/2013) RA 14, RV 57/22, PA 64/36 (48), PCWP 18, FIck CO/CI 3.7/1.6, PVR 8.1 WU, PA sat 47%    History of stomach ulcers    Hypertension    Hypotension    Hypothyroidism    Morbid obesity ([place])    Myocardial infarction ([place]) 08/2013   Nocturnal dyspnea    Nonischemic cardiomyopathy ([facility])    Sinus tachycardia    Sleep apnea    Snoring-prob OSA 09/04/2011   [employer] ICD lead RECALL  6949    UARS (upper airway resistance syndrome) 09/04/2011   HST 12/2013:  AHI 4/hr (numerous episodes of airflow reduction that did not have concomitant desaturation)    Social History   Socioeconomic History   Marital status: Married    Spouse name: Caitriona Sundquist   Number of children: 2   Years of education: Not on file   Highest education level: Not on file  Occupational History   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  Tobacco Use   Smoking status: Never   Smokeless tobacco: Never  Vaping Use   Vaping Use: Never used  Substance and Sexual Activity   Alcohol use: No   Drug use: No   Sexual activity: Yes  Other Topics Concern   Not on file  Social History Narrative   Soda sometimes   Right handed   Social Determinants of Health   Financial Resource Strain: Not on file  Food Insecurity: Not on file  Transportation Needs: Not on file  Physical Activity: Not on file  Stress: Not on file  Social Connections: Not on file   Family History  Problem Relation Age of Onset   Heart disease Maternal Grandmother    Heart disease Mother     Obesity Mother    High blood pressure Father    Stroke Father    Scheduled Meds:  (feeding supplement) PROSource Plus  30 mL Oral TID BM   aspirin EC  81 mg Oral Daily   Chlorhexidine Gluconate Cloth  6 each Topical Daily   dapagliflozin propanediol  10 mg Oral Daily   digoxin  0.0625 mg Oral Daily   heparin  5,000 Units Subcutaneous Q8H   insulin aspart  0-15 Units Subcutaneous TID WC   insulin aspart  0-5 Units Subcutaneous QHS   mouth rinse  15 mL Mouth  Rinse BID   methimazole  10 mg Oral Daily   methimazole  5 mg Oral QPM   potassium chloride  40 mEq Oral Q4H   predniSONE  20 mg Oral Q breakfast   quiniDINE gluconate  324 mg Oral BID   rosuvastatin  40 mg Oral QHS   sodium chloride flush  10-40 mL Intracatheter Q12H   sodium chloride flush  3 mL Intravenous Q12H   sodium chloride flush  3 mL Intravenous Q12H   spironolactone  25 mg Oral Daily   Continuous Infusions:  sodium chloride     amiodarone 30 mg/hr (08/27/21 1000)   furosemide (LASIX) 200 mg in dextrose 5% 100 mL ('2mg'$ /mL) infusion 10 mg/hr (08/27/21 1000)   milrinone 0.25 mcg/kg/min (08/27/21 1000)   potassium chloride 10 mEq (08/27/21 1133)   PRN Meds:.sodium chloride, acetaminophen, ondansetron (ZOFRAN) IV, sodium chloride flush, sodium chloride flush Medications Prior to Admission:  Prior to Admission medications   Medication Sig Start Date End Date Taking? Authorizing Provider  acetaminophen (TYLENOL) 500 MG tablet Take 500 mg by mouth every 6 (six) hours as needed for moderate pain or headache.   Yes [provider]  amiodarone (PACERONE) 200 MG tablet TAKE 1 TABLET BY MOUTH ONCE DAILY Patient taking differently: Take 200 mg by mouth daily. 03/21/21  Yes Deboraha Sprang, MD  ASPIRIN LOW DOSE 81 MG EC tablet TAKE 1 TABLET BY MOUTH ONCE A DAY Patient taking differently: Take 81 mg by mouth daily. 05/02/21  Yes Larey Dresser, MD  BRILINTA 90 MG TABS tablet TAKE 1 TABLET BY MOUTH TWICE A  DAY Patient taking differently: Take 90 mg by mouth 2 (two) times daily. 05/02/21  Yes Larey Dresser, MD  carvedilol (COREG) 3.125 MG tablet Take 1 tablet (3.125 mg total) by mouth 2 (two) times daily with a meal. 07/22/21  Yes Sheikh, Omair Latif, DO  ENTRESTO 24-26 MG TAKE 1 TABLET BY MOUTH TWICE A DAY Patient taking differently: Take 1 tablet by mouth 2 (two) times daily. 05/02/21  Yes Larey Dresser, MD  FARXIGA 10 MG TABS tablet TAKE 1 TABLET BY MOUTH ONCE A DAY BEFOREBREAKFAST Patient taking differently: Take 10 mg by mouth daily. 02/07/21  Yes Larey Dresser, MD  levothyroxine (SYNTHROID) 25 MCG tablet Take 1 tablet (25 mcg total) by mouth daily before breakfast. 09/23/18  Yes Deboraha Sprang, MD  metolazone (ZAROXOLYN) 2.5 MG tablet Take 1 tablet (2.5 mg total) by mouth as needed. Take 1 tablet with extra 40 mEq of KCL only when directed by [facility] clinic Patient taking differently: Take 2.5 mg by mouth daily as needed (fluid when directed by doctor). 07/31/21  Yes Milford, [name] Bo, FNP  potassium chloride SA (KLOR-CON M) 20 MEQ tablet Take 40 mEq by mouth 2 (two) times daily.   Yes [provider]  quiniDINE gluconate 324 MG CR tablet TAKE 1 TABLET BY MOUTH TWICE A DAY Patient taking differently: Take 324 mg by mouth 2 (two) times daily. 07/31/21  Yes Deboraha Sprang, MD  rosuvastatin (CRESTOR) 40 MG tablet Take 1 tablet (40 mg total) by mouth daily. Patient taking differently: Take 40 mg by mouth at bedtime. 05/01/21  Yes McCue, Janett Billow, NP  spironolactone (ALDACTONE) 25 MG tablet TAKE 1 TABLET BY MOUTH EVERY NIGHT AT BEDTIME Patient taking differently: Take 25 mg by mouth at bedtime. 10/26/20  Yes Larey Dresser, MD  torsemide (DEMADEX) 20 MG tablet Take 4 tablets (80 mg total) by mouth  every morning AND 2 tablets (40 mg total) every evening. Patient taking differently: Take 4 tablets (80 mg total) by mouth every morning AND 3 tablets (40 mg total) every evening. 08/20/21 11/18/21 Yes  Milford, [name] Bo, FNP   No Known Allergies Review of Systems  Constitutional:  Positive for fatigue.  Respiratory:  Positive for shortness of breath.    Physical Exam Constitutional:      Appearance: She is overweight. She is ill-appearing.     Interventions: Nasal cannula in place.  Cardiovascular:     Rate and Rhythm: Normal rate.  Musculoskeletal:     Comments: Generalized weakness and fatigue  Skin:    General: Skin is warm and dry.  Neurological:     Mental Status: She is alert and oriented to person, place, and time.    Vital Signs: BP 107/79 (BP Location: Right Arm)    Pulse (!) 110    Temp 97.7 F (36.5 C) (Oral)    Resp (!) 22    Wt 101 kg    SpO2 100%    BMI 39.44 kg/m  Pain Scale: 0-10   Pain Score: 0-No pain   SpO2: SpO2: 100 % O2 Device:SpO2: 100 % O2 Flow Rate: .O2 Flow Rate (L/min): 2 L/min  IO: Intake/output summary:  Intake/Output Summary (Last 24 hours) at 08/27/2021 1210 Last data filed at 08/27/2021 1000 Gross per 24 hour  Intake 1210.64 ml  Output 3600 ml  Net -2389.36 ml    LBM:   Baseline Weight: Weight: 104.5 kg Most recent weight: Weight: 101 kg     Palliative Assessment/Data:     Total time spent on the unit was 75 minutes.  Greater than 50% of the time was spent in counseling and coordination of care  Time In: 1000 Time Out: 1115 Time Total: 75 minutes Greater than 50%  of this time was spent counseling and coordinating care related to the above assessment and plan.  Signed by: Wadie Lessen, NP   Please contact Palliative Medicine Team phone at [phone] for questions and concerns.  For individual provider: See Shea Evans

## 2021-08-27 NOTE — Progress Notes (Signed)
VAD coordinator rounding note: ? ?Met with pt and son today.  ? ?Provided brief equipment overview and demonstration with HeartMate III training loop including discussion on the following:   ?a) power module   ?b) system controller   ?c) universal Charity fundraiser   ?d) battery clips   ?e) Batteries   ?f)  Perc lock   ?g) Percutaneous lead  ? ?Demonstrated and discussed:  ?a) changing power source on system controller from tethered (MPU) to untethered (battery) mode   ?b) changing power source on system controller from untethered (battery) to tethered (MPU) mode   ?c) how to monitor battery life both on the system controller and on each individual battery   ?d) changing batteries  ? ?Pt was given ipad from [place] office to watch VAD educational videos. Renato Gails - VAD pt is coming to see the pt tomorrow morning to meet with Mrs Kauer and her husband.  ? ?All questions have been answered at this time and contact information was provided should they encounter any further questions. ? ? ?Total Session Time: 30 minutes ? ?Tanda Rockers, RN ?VAD Coordinator  ? ?Office: [phone] ?24/7 VAD Pager: [phone] ? ?

## 2021-08-28 ENCOUNTER — Inpatient Hospital Stay (HOSPITAL_COMMUNITY): Payer: BC Managed Care – PPO

## 2021-08-28 ENCOUNTER — Encounter (HOSPITAL_COMMUNITY)
Admission: EM | Disposition: A | Discharge status: Rehabilitation Facility | Payer: Self-pay | Source: Home / Self Care | Attending: Cardiovascular Disease

## 2021-08-28 ENCOUNTER — Encounter (HOSPITAL_COMMUNITY): Payer: Self-pay | Admitting: Cardiology

## 2021-08-28 ENCOUNTER — Ambulatory Visit (HOSPITAL_COMMUNITY): Admission: RE | Admit: 2021-08-28 | Payer: BC Managed Care – PPO | Source: Home / Self Care | Admitting: Cardiology

## 2021-08-28 DIAGNOSIS — R570 Cardiogenic shock: Secondary | ICD-10-CM

## 2021-08-28 DIAGNOSIS — Z01810 Encounter for preprocedural cardiovascular examination: Secondary | ICD-10-CM | POA: Diagnosis not present

## 2021-08-28 DIAGNOSIS — N183 Chronic kidney disease, stage 3 unspecified: Secondary | ICD-10-CM | POA: Diagnosis not present

## 2021-08-28 DIAGNOSIS — I428 Other cardiomyopathies: Secondary | ICD-10-CM | POA: Diagnosis not present

## 2021-08-28 DIAGNOSIS — I5023 Acute on chronic systolic (congestive) heart failure: Secondary | ICD-10-CM | POA: Diagnosis not present

## 2021-08-28 DIAGNOSIS — I509 Heart failure, unspecified: Secondary | ICD-10-CM | POA: Diagnosis not present

## 2021-08-28 HISTORY — PX: CENTRAL LINE INSERTION: CATH118232

## 2021-08-28 HISTORY — PX: RIGHT HEART CATH: CATH118263

## 2021-08-28 LAB — POCT I-STAT EG7
Acid-Base Excess: 0 mmol/L (ref 0.0–2.0)
Acid-Base Excess: 0 mmol/L (ref 0.0–2.0)
Bicarbonate: 25.3 mmol/L (ref 20.0–28.0)
Bicarbonate: 25.6 mmol/L (ref 20.0–28.0)
Calcium, Ion: 1.24 mmol/L (ref 1.15–1.40)
Calcium, Ion: 1.25 mmol/L (ref 1.15–1.40)
HCT: 34 % — ABNORMAL LOW (ref 36.0–46.0)
HCT: 35 % — ABNORMAL LOW (ref 36.0–46.0)
Hemoglobin: 11.6 g/dL — ABNORMAL LOW (ref 12.0–15.0)
Hemoglobin: 11.9 g/dL — ABNORMAL LOW (ref 12.0–15.0)
O2 Saturation: 51 %
O2 Saturation: 52 %
Potassium: 4.3 mmol/L (ref 3.5–5.1)
Potassium: 4.3 mmol/L (ref 3.5–5.1)
Sodium: 135 mmol/L (ref 135–145)
Sodium: 135 mmol/L (ref 135–145)
TCO2: 27 mmol/L (ref 22–32)
TCO2: 27 mmol/L (ref 22–32)
pCO2, Ven: 42.2 mmHg — ABNORMAL LOW (ref 44–60)
pCO2, Ven: 42.3 mmHg — ABNORMAL LOW (ref 44–60)
pH, Ven: 7.385 (ref 7.25–7.43)
pH, Ven: 7.39 (ref 7.25–7.43)
pO2, Ven: 28 mmHg — CL (ref 32–45)
pO2, Ven: 28 mmHg — CL (ref 32–45)

## 2021-08-28 LAB — BASIC METABOLIC PANEL
Anion gap: 13 (ref 5–15)
Anion gap: 13 (ref 5–15)
BUN: 16 mg/dL (ref 6–20)
BUN: 20 mg/dL (ref 6–20)
CO2: 23 mmol/L (ref 22–32)
CO2: 24 mmol/L (ref 22–32)
Calcium: 8.7 mg/dL — ABNORMAL LOW (ref 8.9–10.3)
Calcium: 9.1 mg/dL (ref 8.9–10.3)
Chloride: 96 mmol/L — ABNORMAL LOW (ref 98–111)
Chloride: 97 mmol/L — ABNORMAL LOW (ref 98–111)
Creatinine, Ser: 1.35 mg/dL — ABNORMAL HIGH (ref 0.44–1.00)
Creatinine, Ser: 1.27 mg/dL — ABNORMAL HIGH (ref 0.44–1.00)
GFR, Estimated: 48 mL/min — ABNORMAL LOW (ref >60)
GFR, Estimated: 52 mL/min — ABNORMAL LOW (ref >60)
Glucose, Bld: 292 mg/dL — ABNORMAL HIGH (ref 70–99)
Glucose, Bld: 142 mg/dL — ABNORMAL HIGH (ref 70–99)
Potassium: 3.6 mmol/L (ref 3.5–5.1)
Potassium: 3.2 mmol/L — ABNORMAL LOW (ref 3.5–5.1)
Sodium: 132 mmol/L — ABNORMAL LOW (ref 135–145)
Sodium: 134 mmol/L — ABNORMAL LOW (ref 135–145)

## 2021-08-28 LAB — COOXEMETRY PANEL
Carboxyhemoglobin: 1 % (ref 0.5–1.5)
Methemoglobin: 1.2 % (ref 0.0–1.5)
O2 Saturation: 55.6 %
Total hemoglobin: 10.6 g/dL — ABNORMAL LOW (ref 12.0–16.0)

## 2021-08-28 LAB — HEPATITIS B SURFACE ANTIBODY, QUANTITATIVE: Hep B S AB Quant (Post): <3.1 m[IU]/mL — ABNORMAL LOW (ref >9.9)

## 2021-08-28 LAB — GLUCOSE, CAPILLARY
Glucose-Capillary: 127 mg/dL — ABNORMAL HIGH (ref 70–99)
Glucose-Capillary: 216 mg/dL — ABNORMAL HIGH (ref 70–99)
Glucose-Capillary: 133 mg/dL — ABNORMAL HIGH (ref 70–99)
Glucose-Capillary: 129 mg/dL — ABNORMAL HIGH (ref 70–99)

## 2021-08-28 LAB — CBC
HCT: 34.4 % — ABNORMAL LOW (ref 36.0–46.0)
Hemoglobin: 10.4 g/dL — ABNORMAL LOW (ref 12.0–15.0)
MCH: 22.1 pg — ABNORMAL LOW (ref 26.0–34.0)
MCHC: 30.2 g/dL (ref 30.0–36.0)
MCV: 73.2 fL — ABNORMAL LOW (ref 80.0–100.0)
Platelets: 321 10*3/uL (ref 150–400)
RBC: 4.7 MIL/uL (ref 3.87–5.11)
RDW: 17.3 % — ABNORMAL HIGH (ref 11.5–15.5)
WBC: 17.4 10*3/uL — ABNORMAL HIGH (ref 4.0–10.5)
nRBC: 1.2 % — ABNORMAL HIGH (ref 0.0–0.2)

## 2021-08-28 LAB — LUPUS ANTICOAGULANT PANEL
DRVVT: 44.6 s (ref 0.0–47.0)
PTT Lupus Anticoagulant: 31.8 s (ref 0.0–43.5)

## 2021-08-28 LAB — MAGNESIUM: Magnesium: 2.3 mg/dL (ref 1.7–2.4)

## 2021-08-28 SURGERY — RIGHT HEART CATH
Anesthesia: LOCAL

## 2021-08-28 MED ORDER — MIDAZOLAM HCL 2 MG/2ML IJ SOLN
INTRAMUSCULAR | Status: DC | PRN
  Administered 2021-08-28: 1 mg via INTRAVENOUS

## 2021-08-28 MED ORDER — FUROSEMIDE 10 MG/ML IJ SOLN
40.0000 mg | Freq: Once | INTRAMUSCULAR | Status: AC
Start: 2021-08-28 — End: 2021-08-28
  Administered 2021-08-28: 40 mg via INTRAVENOUS
  Filled 2021-08-28: qty 4

## 2021-08-28 MED ORDER — POTASSIUM CHLORIDE 10 MEQ/50ML IV SOLN
10.0000 meq | INTRAVENOUS | Status: AC
  Administered 2021-08-28 (×3): 10 meq via INTRAVENOUS
  Filled 2021-08-28 (×3): qty 50

## 2021-08-28 MED ORDER — FUROSEMIDE 10 MG/ML IJ SOLN
6.0000 mg/h | INTRAVENOUS | Status: DC
  Administered 2021-08-28 – 2021-08-29 (×3): 12 mg/h via INTRAVENOUS
  Filled 2021-08-28 (×3): qty 20

## 2021-08-28 MED ORDER — HEPARIN (PORCINE) IN NACL 1000-0.9 UT/500ML-% IV SOLN
INTRAVENOUS | Status: DC | PRN
Start: 2021-08-28 — End: 2021-08-28
  Administered 2021-08-28: 500 mL

## 2021-08-28 MED ORDER — MIDAZOLAM HCL 2 MG/2ML IJ SOLN
INTRAMUSCULAR | Status: AC
Start: 2021-08-28 — End: ?
  Filled 2021-08-28: qty 2

## 2021-08-28 MED ORDER — FENTANYL CITRATE (PF) 100 MCG/2ML IJ SOLN
INTRAMUSCULAR | Status: AC
  Filled 2021-08-28: qty 2

## 2021-08-28 MED ORDER — POTASSIUM CHLORIDE CRYS ER 20 MEQ PO TBCR
40.0000 meq | EXTENDED_RELEASE_TABLET | Freq: Once | ORAL | Status: DC
  Filled 2021-08-28: qty 2

## 2021-08-28 MED ORDER — POTASSIUM CHLORIDE 10 MEQ/50ML IV SOLN
10.0000 meq | INTRAVENOUS | Status: AC
  Administered 2021-08-29 (×4): 10 meq via INTRAVENOUS
  Filled 2021-08-28 (×4): qty 50

## 2021-08-28 MED ORDER — HEPARIN (PORCINE) IN NACL 1000-0.9 UT/500ML-% IV SOLN
INTRAVENOUS | Status: AC
  Filled 2021-08-28: qty 500

## 2021-08-28 MED ORDER — METOLAZONE 2.5 MG PO TABS
2.5000 mg | ORAL_TABLET | Freq: Once | ORAL | Status: AC
  Administered 2021-08-28: 2.5 mg via ORAL
  Filled 2021-08-28: qty 1

## 2021-08-28 MED ORDER — POTASSIUM CHLORIDE CRYS ER 20 MEQ PO TBCR
40.0000 meq | EXTENDED_RELEASE_TABLET | Freq: Once | ORAL | Status: AC
  Administered 2021-08-29: 40 meq via ORAL
  Filled 2021-08-28: qty 2

## 2021-08-28 MED ORDER — POTASSIUM CHLORIDE 10 MEQ/50ML IV SOLN
10.0000 meq | INTRAVENOUS | Status: AC
  Administered 2021-08-28 (×4): 10 meq via INTRAVENOUS
  Filled 2021-08-28 (×4): qty 50

## 2021-08-28 MED ORDER — CEFAZOLIN SODIUM-DEXTROSE 2-4 GM/100ML-% IV SOLN
2.0000 g | INTRAVENOUS | Status: AC
  Administered 2021-08-29: 06:00:00 2 g via INTRAVENOUS
  Filled 2021-08-28: qty 100

## 2021-08-28 MED ORDER — LIDOCAINE HCL (PF) 1 % IJ SOLN
INTRAMUSCULAR | Status: DC | PRN
Start: 2021-08-28 — End: 2021-08-28
  Administered 2021-08-28: 5 mL via SUBCUTANEOUS

## 2021-08-28 MED ORDER — LIDOCAINE HCL (PF) 1 % IJ SOLN
INTRAMUSCULAR | Status: AC
  Filled 2021-08-28: qty 30

## 2021-08-28 MED ORDER — FENTANYL CITRATE (PF) 100 MCG/2ML IJ SOLN
INTRAMUSCULAR | Status: DC | PRN
  Administered 2021-08-28: 25 ug via INTRAVENOUS

## 2021-08-28 MED ORDER — ASPIRIN EC 81 MG PO TBEC
81.0000 mg | DELAYED_RELEASE_TABLET | Freq: Every day | ORAL | Status: DC
  Administered 2021-08-29 – 2021-09-02 (×4): 81 mg via ORAL
  Filled 2021-08-28 (×5): qty 1

## 2021-08-28 MED ORDER — ONDANSETRON HCL 4 MG/2ML IJ SOLN
4.0000 mg | Freq: Three times a day (TID) | INTRAMUSCULAR | Status: DC | PRN
  Filled 2021-08-28: qty 2

## 2021-08-28 SURGICAL SUPPLY — 8 items
CATH SWAN GANZ VIP 7.5F (CATHETERS) ×1 IMPLANT
KIT HEART LEFT (KITS) ×3 IMPLANT
PACK CARDIAC CATHETERIZATION (CUSTOM PROCEDURE TRAY) ×3 IMPLANT
SHEATH PINNACLE 8F 10CM (SHEATH) ×1 IMPLANT
SHEATH PROBE COVER 6X72 (BAG) ×1 IMPLANT
SLEEVE REPOSITIONING LENGTH 30 (MISCELLANEOUS) ×1 IMPLANT
TRANSDUCER W/STOPCOCK (MISCELLANEOUS) ×3 IMPLANT
WIRE MICRO SET SILHO 5FR 7 (SHEATH) ×1 IMPLANT

## 2021-08-28 NOTE — Consult Note (Addendum)
[address]       [place] [address]             [phone]      Cardiothoracic Surgery Consultation  Reason for Consult: Acute on chronic systolic heart failure due to nonischemic cardiomyopathy. Referring Physician: Dr. [name]  [name] is an 51 y.o. female.  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  The cause of her cardiomyopathy is unclear.  It was initially identified after her son's birth so it may be peripartum.  She had a CPX in August 2022 showing mild to moderate heart failure limitation.  Echo during this admission shows ejection fraction of less than 20% with severe LV dilatation greater than 7 cm the right ventricle was normal in size with mildly decreased function.  There is moderate to severe functional MR.  Co. ox was 47% on admission.  She was started on milrinone 1.25 and Lasix drip with improvement in her, ox to 56%.  She was last admitted to [facility] in January with heart failure exacerbation and treated with diuresis.  She said that she never really felt well after leaving the hospital and has had ongoing shortness of breath with exertion as well as orthopnea.  She reports having abdominal swelling but no peripheral edema.  Over the few days before admission she reports worsening shortness of breath and fatigue as well as nausea and vomiting x3 and some chest tightness.  She lives in [place] with her husband.  She has 2 daughters who are adults.  She has never smoked and denies alcohol or drug use.  Past Medical History:   Diagnosis Date   Automatic implantable cardioverter-defibrillator in situ    Chronic CHF (congestive heart failure) ([place])    a. EF 15-20% b. RHC (09/2013) RA 14, RV 57/22, PA 64/36 (48), PCWP 18, FIck CO/CI 3.7/1.6, PVR 8.1 WU, PA sat 47%    History of stomach ulcers    Hypertension    Hypotension    Hypothyroidism    Morbid obesity ([place])    Myocardial infarction ([place]) 08/2013   Nocturnal dyspnea    Nonischemic cardiomyopathy ([facility])    Sinus tachycardia    Sleep apnea    Snoring-prob OSA 09/04/2011   Sprint Fidelis ICD lead RECALL  6949    UARS (upper airway resistance syndrome) 09/04/2011   HST 12/2013:  AHI 4/hr (numerous episodes of airflow reduction that did not have concomitant desaturation)     Past Surgical History:  Procedure Laterality Date   BREATH TEK H PYLORI N/A 11/09/2014   Procedure: BREATH TEK H PYLORI;  Surgeon: Greer Pickerel, MD;  Location: [facility];  Service: General;  Laterality: N/A;   CARDIAC CATHETERIZATION  ~ 2006; 09/2013   CARDIAC CATHETERIZATION N/A 05/23/2016   Procedure: Right Heart Cath;  Surgeon: Larey Dresser, MD;  Location: [facility];  Service: Cardiovascular;  Laterality: N/A;   CARDIAC DEFIBRILLATOR PLACEMENT  2006; 12/26/2013   Medtronic [id number]; 12-2013 ICD gen change and RV lead revision with new 6935 RV lead by Dr Alveta Heimlich  SECTION  1999   IMPLANTABLE CARDIOVERTER DEFIBRILLATOR GENERATOR CHANGE N/A 12/26/2013   Procedure: IMPLANTABLE CARDIOVERTER DEFIBRILLATOR GENERATOR CHANGE;  Surgeon: Deboraha Sprang, MD;  Location: [facility] C[facility] LAB;  Service: Cardiovascular;  Laterality: N/A;   IR CT HEAD LTD  04/17/2020   IR CT HEAD LTD  04/17/2020   IR INTRA CRAN STENT  04/17/2020   IR PERCUTANEOUS ART THROMBECTOMY/INFUSION INTRACRANIAL INC DIAG ANGIO  04/17/2020   IR RADIOLOGIST EVAL & MGMT  06/05/2020   LEAD REVISION N/A 12/26/2013   Procedure: LEAD REVISION;  Surgeon: Deboraha Sprang, MD;  Location: [facility] CATH LAB;  Service: Cardiovascular;   Laterality: N/A;   RADIOLOGY WITH ANESTHESIA N/A 04/17/2020   Procedure: Code Stroke;  Surgeon: Luanne Bras, MD;  Location: [facility];  Service: Radiology;  Laterality: N/A;   RIGHT HEART CATHETERIZATION N/A 02/15/2014   Procedure: RIGHT HEART CATH;  Surgeon: Larey Dresser, MD;  Location: [facility] C[facility] LAB;  Service: Cardiovascular;  Laterality: N/A;   TUBAL LIGATION  1999    Family History  Problem Relation Age of Onset   Heart disease Maternal Grandmother    Heart disease Mother    Obesity Mother    High blood pressure Father    Stroke Father     Social History:  reports that she has never smoked. She has never used smokeless tobacco. She reports that she does not drink alcohol and does not use drugs.  Allergies: No Known Allergies  Medications: I have reviewed the patient's current medications. Prior to Admission:  Medications Prior to Admission  Medication Sig Dispense Refill Last Dose   acetaminophen (TYLENOL) 500 MG tablet Take 500 mg by mouth every 6 (six) hours as needed for moderate pain or headache.   Past Month   amiodarone (PACERONE) 200 MG tablet TAKE 1 TABLET BY MOUTH ONCE DAILY (Patient taking differently: Take 200 mg by mouth daily.) 90 tablet 1 08/25/2021   ASPIRIN LOW DOSE 81 MG EC tablet TAKE 1 TABLET BY MOUTH ONCE A DAY (Patient taking differently: Take 81 mg by mouth daily.) 30 tablet 11 08/25/2021   BRILINTA 90 MG TABS tablet TAKE 1 TABLET BY MOUTH TWICE A DAY (Patient taking differently: Take 90 mg by mouth 2 (two) times daily.) 60 tablet 11 08/25/2021   carvedilol (COREG) 3.125 MG tablet Take 1 tablet (3.125 mg total) by mouth 2 (two) times daily with a meal. 60 tablet 0 08/25/2021 at 0900   ENTRESTO 24-26 MG TAKE 1 TABLET BY MOUTH TWICE A DAY (Patient taking differently: Take 1 tablet by mouth 2 (two) times daily.) 60 tablet 11 08/25/2021   FARXIGA 10 MG TABS tablet TAKE 1 TABLET BY MOUTH ONCE A DAY BEFOREBREAKFAST (Patient taking differently: Take 10 mg by mouth daily.)  30 tablet 4 08/25/2021   levothyroxine (SYNTHROID) 25 MCG tablet Take 1 tablet (25 mcg total) by mouth daily before breakfast. 90 tablet 3 08/25/2021   metolazone (ZAROXOLYN) 2.5 MG tablet Take 1 tablet (2.5 mg total) by mouth as needed. Take 1 tablet with extra 40 mEq of KCL only when directed by AHF clinic (Patient taking differently: Take 2.5 mg by mouth daily as needed (fluid when directed by doctor).) 5 tablet 0 08/22/2021   potassium chloride SA (KLOR-CON M) 20 MEQ tablet Take 40 mEq by mouth 2 (two) times daily.   08/25/2021   quiniDINE gluconate 324 MG CR tablet TAKE 1 TABLET BY MOUTH TWICE A DAY (Patient taking differently: Take 324 mg by mouth 2 (two) times  daily.) 60 tablet 6 08/25/2021   rosuvastatin (CRESTOR) 40 MG tablet Take 1 tablet (40 mg total) by mouth daily. (Patient taking differently: Take 40 mg by mouth at bedtime.) 30 tablet 5 08/25/2021   spironolactone (ALDACTONE) 25 MG tablet TAKE 1 TABLET BY MOUTH EVERY NIGHT AT BEDTIME (Patient taking differently: Take 25 mg by mouth at bedtime.) 90 tablet 3 08/24/2021   torsemide (DEMADEX) 20 MG tablet Take 4 tablets (80 mg total) by mouth every morning AND 2 tablets (40 mg total) every evening. (Patient taking differently: Take 4 tablets (80 mg total) by mouth every morning AND 3 tablets (40 mg total) every evening.) 540 tablet 2 08/25/2021   Scheduled:  [MAR Hold] (feeding supplement) PROSource Plus  30 mL Oral TID BM   [MAR Hold] aspirin EC  81 mg Oral Daily   [MAR Hold] Chlorhexidine Gluconate Cloth  6 each Topical Daily   [MAR Hold] dapagliflozin propanediol  10 mg Oral Daily   [MAR Hold] digoxin  0.0625 mg Oral Daily   [MAR Hold] heparin  5,000 Units Subcutaneous Q8H   [MAR Hold] insulin aspart  0-15 Units Subcutaneous TID WC   [MAR Hold] insulin aspart  0-5 Units Subcutaneous QHS   [MAR Hold] mouth rinse  15 mL Mouth Rinse BID   [MAR Hold] methimazole  10 mg Oral Daily   [MAR Hold] methimazole  5 mg Oral QPM   [MAR Hold] multivitamin with  minerals  1 tablet Oral Daily   [MAR Hold] predniSONE  20 mg Oral Q breakfast   [MAR Hold] quiniDINE gluconate  324 mg Oral BID   [MAR Hold] rosuvastatin  40 mg Oral QHS   [MAR Hold] sodium chloride flush  10-40 mL Intracatheter Q12H   [MAR Hold] sodium chloride flush  3 mL Intravenous Q12H   [MAR Hold] sodium chloride flush  3 mL Intravenous Q12H   [MAR Hold] spironolactone  25 mg Oral Daily   Continuous:  [MAR Hold] sodium chloride     sodium chloride     sodium chloride     amiodarone 30 mg/hr (08/28/21 0650)   milrinone 0.375 mcg/kg/min (08/28/21 1244)   PRN:[MAR Hold] sodium chloride, sodium chloride, [MAR Hold] acetaminophen, fentaNYL, lidocaine (PF), midazolam, [MAR Hold] ondansetron (ZOFRAN) IV, [MAR Hold] ondansetron (ZOFRAN) IV, [MAR Hold] sodium chloride flush, [MAR Hold] sodium chloride flush, sodium chloride flush Anti-infectives (From admission, onward)    None       Results for orders placed or performed during the hospital encounter of 08/25/21 (from the past 48 hour(s))  .Cooxemetry Panel (carboxy, met, total hgb, O2 sat)     Status: Abnormal   Collection Time: 08/26/21  5:20 PM  Result Value Ref Range   Total hemoglobin 10.9 (L) 12.0 - 16.0 g/dL   O2 Saturation 46.8 %   Carboxyhemoglobin 0.9 0.5 - 1.5 %   Methemoglobin 0.7 0.0 - 1.5 %    Comment: Performed at [facility] [address] [place], [place] [address]  Prealbumin     Status: Abnormal   Collection Time: 08/26/21  5:20 PM  Result Value Ref Range   Prealbumin 13.1 (L) 18 - 38 mg/dL    Comment: Performed at [facility] [address], [place], [place] [address]  Hepatitis C antibody     Status: None   Collection Time: 08/26/21  5:20 PM  Result Value Ref Range   HCV Ab NON REACTIVE NON REACTIVE    Comment: (NOTE) Nonreactive HCV antibody screen  is consistent with no HCV infections,  unless recent infection is suspected or other evidence exists to indicate HCV  infection.  Performed at [facility], [place] [address] [place], [place] [address]   Hepatitis B core antibody, IgM     Status: None   Collection Time: 08/26/21  5:20 PM  Result Value Ref Range   Hep B C IgM NON REACTIVE NON REACTIVE    Comment: Performed at [facility] [address], [place], [place] [address]  Hepatitis B surface antigen     Status: None   Collection Time: 08/26/21  5:20 PM  Result Value Ref Range   Hepatitis B Surface Ag NON REACTIVE NON REACTIVE    Comment: Performed at [facility] [address], [place], [place] [address]  Hepatitis B surface antibody,quantitative     Status: Abnormal   Collection Time: 08/26/21  5:20 PM  Result Value Ref Range   Hepatitis B-Post <3.1 (L) Immunity>9.9 mIU/mL    Comment: (NOTE)  Status of Immunity                     Anti-HBs Level  ------------------                     -------------- Inconsistent with Immunity                   0.0 - 9.9 Consistent with Immunity                          >9.9 Performed At: [facility] [place], [place] [id number] Rush Farmer MD TD:[id number]   Antithrombin III     Status: None   Collection Time: 08/26/21  5:20 PM  Result Value Ref Range   AntiThromb III Func 97 75 - 120 %    Comment: Performed at [facility] [address], [place], [place] [address]  Lipid panel     Status: Abnormal   Collection Time: 08/26/21  5:20 PM  Result Value Ref Range   Cholesterol 100 0 - 200 mg/dL   Triglycerides 95 <150 mg/dL   HDL 26 (L) >40 mg/dL   Total CHOL/HDL Ratio 3.8 RATIO   VLDL 19 0 - 40 mg/dL   LDL Cholesterol 55 0 - 99 mg/dL    Comment:        Total Cholesterol/HDL:CHD Risk Coronary Heart Disease Risk Table                     Men   Women  1/2 Average Risk   3.4   3.3  Average Risk       5.0   4.4  2 X Average Risk   9.6   7.1  3 X Average Risk  23.4   11.0        Use the calculated Patient Ratio above and the CHD Risk Table to  determine the patient's CHD Risk.        ATP III CLASSIFICATION (LDL):  <100     mg/dL   Optimal  100-129  mg/dL   Near or Above                    Optimal  130-159  mg/dL   Borderline  160-189  mg/dL   High  >190     mg/dL   Very High Performed at [facility]  [facility], [place] [address] [place], [place] [address]   Uric acid     Status: Abnormal   Collection Time: 08/26/21  5:20 PM  Result Value Ref Range   Uric Acid, Serum 11.3 (H) 2.5 - 7.1 mg/dL    Comment: Performed at [facility] [address] [place], [place] [address]  Lactate dehydrogenase     Status: Abnormal   Collection Time: 08/26/21  5:20 PM  Result Value Ref Range   LDH 221 (H) 98 - 192 U/L    Comment: Performed at [facility] [address] [place], [place] [address]  Type and screen [place]     Status: None   Collection Time: 08/26/21  6:00 PM  Result Value Ref Range   ABO/RH(D) B POS    Antibody Screen NEG    Sample Expiration      08/29/2021,2359 Performed at [facility], [place] [address] [place], [place] [address]   Basic metabolic panel     Status: Abnormal   Collection Time: 08/27/21  4:05 AM  Result Value Ref Range   Sodium 132 (L) 135 - 145 mmol/L   Potassium 2.9 (L) 3.5 - 5.1 mmol/L    Comment: DELTA CHECK NOTED   Chloride 98 98 - 111 mmol/L   CO2 24 22 - 32 mmol/L   Glucose, Bld 197 (H) 70 - 99 mg/dL    Comment: Glucose reference range applies only to samples taken after fasting for at least 8 hours.   BUN 14 6 - 20 mg/dL   Creatinine, Ser 1.24 (H) 0.44 - 1.00 mg/dL   Calcium 8.6 (L) 8.9 - 10.3 mg/dL   GFR, Estimated 53 (L) >60 mL/min    Comment: (NOTE) Calculated using the CKD-EPI Creatinine Equation (2021)    Anion gap 10 5 - 15    Comment: Performed at [facility] [address] [place], [place] [address]  Magnesium     Status: None   Collection Time: 08/27/21  4:05 AM  Result Value Ref Range   Magnesium 1.9 1.7 - 2.4 mg/dL    Comment:  Performed at [facility] [address], [place], [place] [address]  .Cooxemetry Panel (carboxy, met, total hgb, O2 sat)     Status: Abnormal   Collection Time: 08/27/21  4:05 AM  Result Value Ref Range   Total hemoglobin 11.3 (L) 12.0 - 16.0 g/dL   O2 Saturation 54.7 %   Carboxyhemoglobin 0.9 0.5 - 1.5 %   Methemoglobin <0.7 0.0 - 1.5 %    Comment: Performed at [facility] [address] [place],  [address]  Hemoglobin A1c     Status: None   Collection Time: 08/27/21  9:07 AM  Result Value Ref Range   Hgb A1c MFr Bld 5.1 4.8 - 5.6 %    Comment: (NOTE) Pre diabetes:          5.7%-6.4%  Diabetes:              >6.4%  Glycemic control for   <7.0% adults with diabetes    Mean Plasma Glucose 99.67 mg/dL    Comment: Performed at [facility] [address] [place], [place] [address]  Glucose, capillary     Status: Abnormal   Collection Time: 08/27/21 12:46 PM  Result Value Ref Range   Glucose-Capillary 121 (H) 70 - 99 mg/dL    Comment: Glucose reference range  applies only to samples taken after fasting for at least 8 hours.  Glucose, capillary     Status: Abnormal   Collection Time: 08/27/21  4:41 PM  Result Value Ref Range   Glucose-Capillary 134 (H) 70 - 99 mg/dL    Comment: Glucose reference range applies only to samples taken after fasting for at least 8 hours.  Basic metabolic panel     Status: Abnormal   Collection Time: 08/27/21  4:50 PM  Result Value Ref Range   Sodium 134 (L) 135 - 145 mmol/L   Potassium 4.0 3.5 - 5.1 mmol/L    Comment: SLIGHT HEMOLYSIS DELTA CHECK NOTED    Chloride 100 98 - 111 mmol/L   CO2 23 22 - 32 mmol/L   Glucose, Bld 183 (H) 70 - 99 mg/dL    Comment: Glucose reference range applies only to samples taken after fasting for at least 8 hours.   BUN 16 6 - 20 mg/dL   Creatinine, Ser 1.28 (H) 0.44 - 1.00 mg/dL   Calcium 8.8 (L) 8.9 - 10.3 mg/dL   GFR, Estimated 51 (L) >60 mL/min    Comment: (NOTE) Calculated using  the CKD-EPI Creatinine Equation (2021)    Anion gap 11 5 - 15    Comment: Performed at [facility] [address], [place], [place] [address]  Glucose, capillary     Status: Abnormal   Collection Time: 08/27/21  9:32 PM  Result Value Ref Range   Glucose-Capillary 135 (H) 70 - 99 mg/dL    Comment: Glucose reference range applies only to samples taken after fasting for at least 8 hours.   Comment 1 Document in Chart   .Cooxemetry Panel (carboxy, met, total hgb, O2 sat)     Status: Abnormal   Collection Time: 08/28/21  4:13 AM  Result Value Ref Range   Total hemoglobin 10.6 (L) 12.0 - 16.0 g/dL   O2 Saturation 55.6 %   Carboxyhemoglobin 1.0 0.5 - 1.5 %   Methemoglobin 1.2 0.0 - 1.5 %    Comment: Performed at [facility], [place] [address] [place], [place] [address]  Basic metabolic panel     Status: Abnormal   Collection Time: 08/28/21  4:15 AM  Result Value Ref Range   Sodium 132 (L) 135 - 145 mmol/L   Potassium 3.6 3.5 - 5.1 mmol/L   Chloride 96 (L) 98 - 111 mmol/L   CO2 23 22 - 32 mmol/L   Glucose, Bld 292 (H) 70 - 99 mg/dL    Comment: Glucose reference range applies only to samples taken after fasting for at least 8 hours.   BUN 16 6 - 20 mg/dL   Creatinine, Ser 1.35 (H) 0.44 - 1.00 mg/dL   Calcium 8.7 (L) 8.9 - 10.3 mg/dL   GFR, Estimated 48 (L) >60 mL/min    Comment: (NOTE) Calculated using the CKD-EPI Creatinine Equation (2021)    Anion gap 13 5 - 15    Comment: Performed at [facility] [address], [place], [place] [address]  Magnesium     Status: None   Collection Time: 08/28/21  4:15 AM  Result Value Ref Range   Magnesium 2.3 1.7 - 2.4 mg/dL    Comment: Performed at [facility] [address] [place],  [address]  CBC     Status: Abnormal   Collection Time: 08/28/21  4:15 AM  Result Value Ref Range   WBC 17.4 (H) 4.0 - 10.5  K/uL   RBC 4.70 3.87 - 5.11 MIL/uL   Hemoglobin 10.4 (L) 12.0 - 15.0 g/dL   HCT 34.4 (L) 36.0 - 46.0 %    MCV 73.2 (L) 80.0 - 100.0 fL   MCH 22.1 (L) 26.0 - 34.0 pg   MCHC 30.2 30.0 - 36.0 g/dL   RDW 17.3 (H) 11.5 - 15.5 %   Platelets 321 150 - 400 K/uL   nRBC 1.2 (H) 0.0 - 0.2 %    Comment: Performed at [facility] [address], [place], [place] [address]  Glucose, capillary     Status: Abnormal   Collection Time: 08/28/21  7:28 AM  Result Value Ref Range   Glucose-Capillary 127 (H) 70 - 99 mg/dL    Comment: Glucose reference range applies only to samples taken after fasting for at least 8 hours.  Glucose, capillary     Status: Abnormal   Collection Time: 08/28/21 11:26 AM  Result Value Ref Range   Glucose-Capillary 216 (H) 70 - 99 mg/dL    Comment: Glucose reference range applies only to samples taken after fasting for at least 8 hours.    CT ABDOMEN PELVIS WO CONTRAST  Result Date: 08/27/2021 CLINICAL DATA:  51 year old female undergoing evaluation prior to potential LVAD placement. EXAM: CT ABDOMEN AND PELVIS WITHOUT CONTRAST TECHNIQUE: Multidetector CT imaging of the abdomen and pelvis was performed following the standard protocol without IV contrast. RADIATION DOSE REDUCTION: This exam was performed according to the departmental dose-optimization program which includes automated exposure control, adjustment of the mA and/or kV according to patient size and/or use of iterative reconstruction technique. COMPARISON:  No priors. FINDINGS: Lower chest: Patchy areas of ground-glass attenuation and mild interlobular septal thickening in the visualize lung bases, likely reflective of a background of mild interstitial pulmonary edema. Cardiomegaly with left atrial dilatation. Pacemaker/AICD lead terminating in the right ventricular apex. Hepatobiliary: No definite suspicious cystic or solid hepatic lesions are confidently identified on today's noncontrast CT examination. Amorphous intermediate to high attenuation material lying dependently in the gallbladder lumen, compatible with biliary  sludge. No findings to suggest an acute cholecystitis are noted at this time. Pancreas: No definite pancreatic mass or peripancreatic fluid collections or inflammatory changes are noted on today's noncontrast CT examination. Spleen: Unremarkable. Adrenals/Urinary Tract: 5 mm nonobstructive calculus in the lower pole collecting system of the right kidney. Unenhanced appearance of the left kidney and bilateral adrenal glands is otherwise unremarkable. No hydroureteronephrosis. Urinary bladder is normal in appearance. Stomach/Bowel: Unenhanced appearance of the stomach is normal. There is no pathologic dilatation of small bowel or colon. Normal appendix. Vascular/Lymphatic: No atherosclerotic calcifications are noted in the abdominal aorta or pelvic vasculature. No lymphadenopathy noted in the abdomen or pelvis. Reproductive: Uterus and right ovary are unremarkable in appearance. In the left ovary there is a large 11.7 x 4.3 x 7.4 cm low-attenuation (3 HU) mass (axial image 65 of series 3 and sagittal image 89 of series 7). Other: No abnormal structures identified in the left upper quadrant of the abdomen to preclude LVAD placement. No significant volume of ascites. No pneumoperitoneum. Musculoskeletal: There are no aggressive appearing lytic or blastic lesions noted in the visualized portions of the skeleton. IMPRESSION: 1. No unexpected findings to preclude LVAD placement. 2. Large cystic mass associated with the left ovary. Further evaluation with pelvic ultrasound is recommended at this time to exclude the possibility of ovarian malignancy. 3. Cardiomegaly. 4. Evidence of mild interstitial pulmonary edema in the lung bases, which  could suggest congestive heart failure. 5. Biliary sludge in the gallbladder. No findings to suggest an acute cholecystitis at this time. 6. 5 mm nonobstructive calculus in the lower pole collecting system of the right kidney. No ureteral stones or findings of urinary tract obstruction are  noted at this time. Electronically Signed   By: Vinnie Langton M.D.   On: 08/27/2021 05:15   DG Orthopantogram  Result Date: 08/27/2021 CLINICAL DATA:  Screening prior to possible cardiac surgery. No chest complaints. EXAM: ORTHOPANTOGRAM/PANORAMIC COMPARISON:  None. FINDINGS: Normal appearing teeth without caries or periapical lucency. IMPRESSION: 1. Normal. Electronically Signed   By: Titus Dubin M.D.   On: 08/27/2021 12:55   DG CHEST PORT 1 VIEW  Result Date: 08/26/2021 CLINICAL DATA:  Central line placement. EXAM: PORTABLE CHEST 1 VIEW COMPARISON:  Chest x-ray dated August 25, 2021. FINDINGS: New right upper extremity PICC line with tip in the right brachiocephalic vein. Unchanged left chest wall pacemaker. Stable cardiomegaly. Normal pulmonary vascularity. No focal consolidation, pleural effusion, or pneumothorax. No acute osseous abnormality. IMPRESSION: 1. New right upper extremity PICC line with tip in the right brachiocephalic vein. 2. No active disease. Electronically Signed   By: Titus Dubin M.D.   On: 08/26/2021 13:12   US THYROID  Result Date: 08/27/2021 CLINICAL DATA:  Hyperthyroidism. EXAM: THYROID ULTRASOUND TECHNIQUE: Ultrasound examination of the thyroid gland and adjacent soft tissues was performed. COMPARISON:  Chest CT 08/25/2021 FINDINGS: Parenchymal Echotexture: Moderately heterogenous Isthmus: 0.3 cm Right lobe: 5.2 x 2.3 x 2.1 cm Left lobe: Absent _________________________________________________________ Estimated total number of nodules >/= 1 cm: 0 Number of spongiform nodules >/=  2 cm not described below (TR1): 0 Number of mixed cystic and solid nodules >/= 1.5 cm not described below (TR2): 0 _________________________________________________________ Right thyroid lobe is moderately heterogeneous with at least 2 small nodules, largest measuring 0.5 cm. These nodules do not meet criteria for biopsy or dedicated follow-up. No definite thyroid tissue in the expected region of  the left thyroid lobe. 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 IMPRESSION: 1. 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 2. Thyroid tissue is heterogeneous with small nodules. These nodules do not meet criteria for biopsy or dedicated follow-up. The above is in keeping with the ACR TI-RADS recommendations - J Am Coll Radiol 2017;14:587-595. Electronically Signed   By: Markus Daft M.D.   On: 08/27/2021 08:18   US PELVIC COMPLETE W TRANSVAGINAL AND TORSION R/O  Result Date: 08/27/2021 CLINICAL DATA:  Ovarian mass. EXAM: TRANSABDOMINAL AND TRANSVAGINAL ULTRASOUND OF PELVIS DOPPLER ULTRASOUND OF OVARIES TECHNIQUE: Both transabdominal and transvaginal ultrasound examinations of the pelvis were performed. Transabdominal technique was performed for global imaging of the pelvis including uterus, ovaries, adnexal regions, and pelvic cul-de-sac. It was necessary to proceed with endovaginal exam following the transabdominal exam to visualize the ovaries. Color and duplex Doppler ultrasound was utilized to evaluate blood flow to the ovaries. COMPARISON:  August 26, 2021. FINDINGS: Uterus Measurements: 12.2 x 7.1 x 4.5 cm = volume: 203 mL. No fibroids or other mass visualized. Endometrium Thickness: 9 mm which is within normal limits. No focal abnormality visualized. Right ovary Measurements: 2.9 x 1.8 x 1.7 cm = volume: 4 mL. Normal appearance/no adnexal mass. Left ovary Measurements: 12.0 x 9.4 x 4.8 cm = volume: 284 mL. Large complex multi-septated cystic mass is seen essentially replacing the left ovary. Pulsed Doppler evaluation of both ovaries demonstrates normal low-resistance arterial and venous waveforms. Other  findings No abnormal free fluid. 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 MRI is recommended for further evaluation. Consultation with gynecologic surgery is recommended as  well. Electronically Signed   By: Marijo Conception M.D.   On: 08/27/2021 18:39   [name] EKG SITE RITE  Result Date: 08/26/2021 If Site Rite image not attached, placement could not be confirmed due to current cardiac rhythm.   Review of Systems  Constitutional:  Positive for activity change, appetite change and fatigue. Negative for chills and fever.  HENT: Negative.  Negative for dental problem.   Eyes: Negative.   Respiratory:  Positive for chest tightness and shortness of breath.   Cardiovascular:  Positive for palpitations. Negative for chest pain and leg swelling.  Gastrointestinal:  Positive for nausea and vomiting.  Endocrine: Negative.   Genitourinary: Negative.   Musculoskeletal: Negative.   Skin: Negative.   Allergic/Immunologic: Negative.   Neurological:  Negative for dizziness and syncope.  Hematological: Negative.   Psychiatric/Behavioral: Negative.    Blood pressure (!) 87/60, pulse (!) 115, temperature 97.7 F (36.5 C), temperature source Oral, resp. rate 19, weight 101.3 kg, SpO2 100 %. Physical Exam Constitutional:      Appearance: She is well-developed. She is obese.  HENT:     Head: Normocephalic and atraumatic.     Mouth/Throat:     Mouth: Mucous membranes are moist.     Pharynx: Oropharynx is clear.  Eyes:     Extraocular Movements: Extraocular movements intact.     Conjunctiva/sclera: Conjunctivae normal.     Pupils: Pupils are equal, round, and reactive to light.  Cardiovascular:     Rate and Rhythm: Normal rate and regular rhythm.     Pulses: Normal pulses.     Heart sounds: Normal heart sounds. No murmur heard. Pulmonary:     Effort: Pulmonary effort is normal.     Breath sounds: Normal breath sounds. No rales.  Abdominal:     General: Bowel sounds are normal. There is no distension.     Palpations: Abdomen is soft.     Tenderness: There is no abdominal tenderness.  Musculoskeletal:        General: No swelling or tenderness. Normal range of motion.      Cervical back: Normal range of motion and neck supple.  Skin:    General: Skin is warm and dry.  Neurological:     General: No focal deficit present.     Mental Status: She is alert and oriented to person, place, and time.  Psychiatric:        Mood and Affect: Mood normal.        Behavior: Behavior normal.   ECHOCARDIOGRAM REPORT         Patient Name:   [name] Date of Exam: 08/26/2021  Medical Rec #:  [id number]    Height:       63.0 in  Accession #:    [id number]   Weight:       230.4 lb  Date of Birth:  30-Dec-1970    BSA:          2.053 m  Patient Age:    50 years     BP:           105/77 mmHg  Patient Gender: F            HR:           111 bpm.  Exam Location:  Inpatient   Procedure: 2D Echo, Cardiac Doppler, Color Doppler, 3D Echo and  Intracardiac             Opacification Agent   Indications:    CHF-Acute Systolic G25.63     History:        Patient has prior history of Echocardiogram examinations,  most                  recent 07/21/2021. Cardiomyopathy and CHF, Previous  Myocardial                  Infarction, Arrythmias:Tachycardia; Risk Factors:Sleep  Apnea and                  Hypertension.     Sonographer:    Bernadene Person RDCS  Referring Phys: [id number] [place]     1. Left ventricular ejection fraction, by estimation, is <20%. The left  ventricle has severely decreased function. The left ventricle demonstrates  global hypokinesis. The left ventricular internal cavity size was severely  dilated. No LV thrombus noted.  Indeterminate diastolic filling due to E-A fusion.   2. Right ventricular systolic function is mildly reduced. The right  ventricular size is normal. There is mildly elevated pulmonary artery  systolic pressure. The estimated right ventricular systolic pressure is  87.6 mmHg.   3. Left atrial size was severely dilated.   4. Right atrial size was mildly dilated.   5. The mitral valve is abnormal. Moderate-severe  mitral valve  regurgitation, ERO 0.22 cm^2 by PISA but looks worse visually. Suspect  functional MR. No evidence of mitral stenosis.   6. The aortic valve is tricuspid. Aortic valve regurgitation is not  visualized. No aortic stenosis is present.   7. The inferior vena cava is dilated in size with <50% respiratory  variability, suggesting right atrial pressure of 15 mmHg.   FINDINGS   Left Ventricle: Left ventricular ejection fraction, by estimation, is  <20%. The left ventricle has severely decreased function. The left  ventricle demonstrates global hypokinesis. Definity contrast agent was  given IV to delineate the left ventricular  endocardial borders. The left ventricular internal cavity size was  severely dilated. There is no left ventricular hypertrophy. Indeterminate  diastolic filling due to E-A fusion.   Right Ventricle: The right ventricular size is normal. No increase in  right ventricular wall thickness. Right ventricular systolic function is  mildly reduced. There is mildly elevated pulmonary artery systolic  pressure. The tricuspid regurgitant velocity   is 2.64 m/s, and with an assumed right atrial pressure of 15 mmHg, the  estimated right ventricular systolic pressure is 81.1 mmHg.   Left Atrium: Left atrial size was severely dilated.   Right Atrium: Right atrial size was mildly dilated.   Pericardium: There is no evidence of pericardial effusion.   Mitral Valve: The mitral valve is abnormal. Moderate to severe mitral  valve regurgitation. No evidence of mitral valve stenosis.   Tricuspid Valve: The tricuspid valve is normal in structure. Tricuspid  valve regurgitation is mild.   Aortic Valve: The aortic valve is tricuspid. Aortic valve regurgitation is  not visualized. No aortic stenosis is present.   Pulmonic Valve: The pulmonic valve was normal in structure. Pulmonic valve  regurgitation is trivial.   Aorta: The aortic root is normal in size and structure.    Venous: The inferior vena cava is dilated in size with less than 50%  respiratory variability, suggesting  right atrial pressure of 15 mmHg.   IAS/Shunts: No atrial level shunt detected by color flow Doppler.   Additional Comments: A device lead is visualized in the right ventricle.      LEFT VENTRICLE  PLAX 2D  LVIDd:         7.50 cm  LVIDs:         6.90 cm  LV PW:         0.80 cm  LV IVS:        0.90 cm  LVOT diam:     2.20 cm      3D Volume EF:  LV SV:         32           3D EF:        19 %  LV SV Index:   15           LV EDV:       374 ml  LVOT Area:     3.80 cm     LV ESV:       303 ml                              LV SV:        71 ml     LV Volumes (MOD)  LV vol d, MOD A2C: 249.0 ml  LV vol d, MOD A4C: 237.0 ml  LV vol s, MOD A2C: 212.0 ml  LV vol s, MOD A4C: 216.0 ml  LV SV MOD A2C:     37.0 ml  LV SV MOD A4C:     237.0 ml  LV SV MOD BP:      23.9 ml   RIGHT VENTRICLE  RV S prime:     12.20 cm/s  TAPSE (M-mode): 2.4 cm   LEFT ATRIUM              Index        RIGHT ATRIUM           Index  LA diam:        5.50 cm  2.68 cm/m   RA Area:     18.00 cm  LA Vol (A2C):   127.0 ml 61.85 ml/m  RA Volume:   43.30 ml  21.09 ml/m  LA Vol (A4C):   137.0 ml 66.72 ml/m  LA Biplane Vol: 135.0 ml 65.74 ml/m   AORTIC VALVE  LVOT Vmax:   71.00 cm/s  LVOT Vmean:  45.450 cm/s  LVOT VTI:    0.083 m     AORTA  Ao Root diam: 2.80 cm  Ao Asc diam:  2.80 cm   MR Peak grad:    81.0 mmHg    TRICUSPID VALVE  MR Mean grad:    49.5 mmHg    TR Peak grad:   27.9 mmHg  MR Vmax:         450.00 cm/s  TR Vmax:        264.00 cm/s  MR Vmean:        325.5 cm/s  MR PISA:         2.65 cm     SHUNTS  MR PISA Eff ROA: 20 mm       Systemic VTI:  0.08 m  MR PISA Radius:  0.65 cm      Systemic Diam: 2.20 cm   Dalton McleanMD  Electronically signed by Franki Monte  Signature Date/Time: 08/26/2021/2:54:38 PM         Final     Physicians  Panel Physicians Referring Physician Case  Authorizing Physician  Larey Dresser, MD (Primary)     Procedures  CENTRAL LINE INSERTION  RIGHT HEART CATH   Conclusion  1. Elevated right and left heart filling pressures.  2. 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  3. Low cardiac output, marked difference between Fick and thermodilution.  4. PAPI low at 1.33.    Discussed with Dr. Cyndia Bent.  Hopefully can get Impella 5.5 in OR tomorrow.  In the meantime, will increase milrinone to 0.375 and push diuresis.     Procedural Details  Technical Details Procedure: Right Heart Cath  Indication: Cardiogenic shock   Procedural Details: The right neck was prepped, draped, and anesthetized with 1% lidocaine. Using the modified Seldinger technique and ultrasound guidance, an 8 French sheath was placed in the right internal jugular vein. A Swan-Ganz catheter was used for the right heart catheterization. Standard protocol was followed for recording of right heart pressures and sampling of oxygen saturations. Fick and thermodilution cardiac output was calculated. There were no immediate procedural complications. The patient was transferred to the post catheterization recovery area for further monitoring.     Estimated blood loss <50 mL.   During this procedure medications were administered to achieve and maintain moderate conscious sedation while the patient's heart rate, blood pressure, and oxygen saturation were continuously monitored and I was present face-to-face 100% of this time.   Medications (Filter: Administrations occurring from 1150 to 1311 on 08/28/21)  important  Continuous medications are totaled by the amount administered until 08/28/21 1311.   fentaNYL (SUBLIMAZE) injection (mcg) Total dose:  25 mcg Date/Time Rate/Dose/Volume Action   08/28/21 1209 25 mcg Given    midazolam (VERSED) injection (mg) Total dose:  1 mg Date/Time Rate/Dose/Volume Action   08/28/21 1209 1 mg Given    lidocaine (PF)  (XYLOCAINE) 1 % injection (mL) Total volume:  5 mL Date/Time Rate/Dose/Volume Action   08/28/21 1212 5 mL Given    Heparin (Porcine) in NaCl 1000-0.9 UT/500ML-% SOLN (mL) Total volume:  500 mL Date/Time Rate/Dose/Volume Action   08/28/21 1304 500 mL Given    (feeding supplement) PROSource Plus liquid 30 mL (mL) Total dose:  Cannot be calculated* Dosing weight:  101 *Administration dose not documented Date/Time Rate/Dose/Volume Action   08/28/21 1204 *Not included in total MAR Hold    0.9 %  sodium chloride infusion (mL) Total dose:  Cannot be calculated* Dosing weight:  105.7 *Administration dose not documented Date/Time Rate/Dose/Volume Action   08/28/21 1204 *Not included in total MAR Hold    acetaminophen (TYLENOL) tablet 650 mg (mg) Total dose:  Cannot be calculated* Dosing weight:  105.7 *Administration dose not documented Date/Time Rate/Dose/Volume Action   08/28/21 1204 *Not included in total MAR Hold    aspirin EC tablet 81 mg (mg) Total dose:  Cannot be calculated* *Administration dose not documented Date/Time Rate/Dose/Volume Action   08/28/21 1204 *Not included in total MAR Hold    Chlorhexidine Gluconate Cloth 2 % PADS 6 each (each) Total dose:  Cannot be calculated* Dosing weight:  105.7 *Administration dose not documented Date/Time Rate/Dose/Volume Action   08/28/21 1204 *Not included in total MAR Hold    dapagliflozin propanediol (FARXIGA) tablet 10 mg (mg) Total dose:  Cannot be calculated* *Administration dose not documented Date/Time Rate/Dose/Volume Action   08/28/21 1204 *Not included in total MAR Hold    digoxin (  LANOXIN) tablet 0.0625 mg (mg) Total dose:  Cannot be calculated* Dosing weight:  104.5 *Administration dose not documented Date/Time Rate/Dose/Volume Action   08/28/21 1204 *Not included in total MAR Hold    heparin injection 5,000 Units (Units) Total dose:  Cannot be calculated* Dosing weight:  105.7 *Administration dose not  documented Date/Time Rate/Dose/Volume Action   08/28/21 1204 *Not included in total MAR Hold    insulin aspart (novoLOG) injection 0-15 Units (Units) Total dose:  Cannot be calculated* Dosing weight:  101 *Administration dose not documented Date/Time Rate/Dose/Volume Action   08/28/21 1204 *Not included in total MAR Hold    insulin aspart (novoLOG) injection 0-5 Units (Units) Total dose:  Cannot be calculated* Dosing weight:  101 *Administration dose not documented Date/Time Rate/Dose/Volume Action   08/28/21 1204 *Not included in total MAR Hold    MEDLINE mouth rinse (mL) Total dose:  Cannot be calculated* Dosing weight:  105.7 *Administration dose not documented Date/Time Rate/Dose/Volume Action   08/28/21 1204 *Not included in total MAR Hold    methimazole (TAPAZOLE) tablet 10 mg (mg) Total dose:  Cannot be calculated* Dosing weight:  104.5 *Administration dose not documented Date/Time Rate/Dose/Volume Action   08/28/21 1204 *Not included in total MAR Hold    methimazole (TAPAZOLE) tablet 5 mg (mg) Total dose:  Cannot be calculated* Dosing weight:  104.5 *Administration dose not documented Date/Time Rate/Dose/Volume Action   08/28/21 1204 *Not included in total MAR Hold    milrinone (PRIMACOR) 20 MG/100 ML (0.2 mg/mL) infusion (mcg/kg/min) Total dose:  1.07 mg Dosing weight:  105.7 Date/Time Rate/Dose/Volume Action   08/28/21 1244 0.375 mcg/kg/min - 11.89 mL/hr Rate/Dose Change    multivitamin with minerals tablet 1 tablet (tablet) Total dose:  Cannot be calculated* Dosing weight:  101 *Administration dose not documented Date/Time Rate/Dose/Volume Action   08/28/21 1204 *Not included in total MAR Hold    ondansetron (ZOFRAN) injection 4 mg (mg) Total dose:  Cannot be calculated* Dosing weight:  105.7 *Administration dose not documented Date/Time Rate/Dose/Volume Action   08/28/21 1204 *Not included in total MAR Hold    ondansetron (ZOFRAN) injection 4 mg  (mg) Total dose:  Cannot be calculated* Dosing weight:  101.3 *Administration dose not documented Date/Time Rate/Dose/Volume Action   08/28/21 1204 *Not included in total MAR Hold    predniSONE (DELTASONE) tablet 20 mg (mg) Total dose:  Cannot be calculated* Dosing weight:  104.5 *Administration dose not documented Date/Time Rate/Dose/Volume Action   08/28/21 1204 *Not included in total MAR Hold    quiniDINE gluconate CR tablet 324 mg (mg) Total dose:  Cannot be calculated* Dosing weight:  104.5 *Administration dose not documented Date/Time Rate/Dose/Volume Action   08/28/21 1204 *Not included in total MAR Hold    rosuvastatin (CRESTOR) tablet 40 mg (mg) Total dose:  Cannot be calculated* Dosing weight:  105.7 *Administration dose not documented Date/Time Rate/Dose/Volume Action   08/28/21 1204 *Not included in total MAR Hold    sodium chloride flush (NS) 0.9 % injection 10-40 mL (mL) Total dose:  Cannot be calculated* Dosing weight:  104.5 *Administration dose not documented Date/Time Rate/Dose/Volume Action   08/28/21 1204 *Not included in total MAR Hold    sodium chloride flush (NS) 0.9 % injection 10-40 mL (mL) Total dose:  Cannot be calculated* Dosing weight:  104.5 *Administration dose not documented Date/Time Rate/Dose/Volume Action   08/28/21 1204 *Not included in total MAR Hold    sodium chloride flush (NS) 0.9 % injection 3 mL (mL) Total dose:  Cannot be  calculated* Dosing weight:  105.7 *Administration dose not documented Date/Time Rate/Dose/Volume Action   08/28/21 1204 *Not included in total MAR Hold    sodium chloride flush (NS) 0.9 % injection 3 mL (mL) Total dose:  Cannot be calculated* Dosing weight:  105.7 *Administration dose not documented Date/Time Rate/Dose/Volume Action   08/28/21 1204 *Not included in total MAR Hold    sodium chloride flush (NS) 0.9 % injection 3 mL (mL) Total dose:  Cannot be calculated* Dosing weight:  101 *Administration  dose not documented Date/Time Rate/Dose/Volume Action   08/28/21 1204 *Not included in total MAR Hold    spironolactone (ALDACTONE) tablet 25 mg (mg) Total dose:  Cannot be calculated* Dosing weight:  101 *Administration dose not documented Date/Time Rate/Dose/Volume Action   08/28/21 1204 *Not included in total MAR Hold    Sedation Time  Sedation Time Physician-1: 32 minutes 5 seconds Radiation/Fluoro  Fluoro time: 4.2 (min) DAP: 3361 (mGycm2) Cumulative Air Kerma: 44 (mGy) Complications  Complications documented before study signed (08/28/2021  5:73 PM)   No complications were associated with this study.  Documented by Scarlette Calico, RN - 08/28/2021  1:04 PM     Right Heart  Right Heart Pressures RHC Procedural Findings: Hemodynamics (mmHg) RA mean 15 RV 63/20 PA 64/44, mean 53 PCWP mean 37 PAPI 1.33   Oxygen saturations: PA 52% AO 100%  Cardiac Output (Fick) 3.97  Cardiac Index (Fick) 1.96  PVR 4 WU  Cardiac Output (thermo) 2.27 Cardiac Index (thermo) 1.12   Assessment/Plan:  This 51 year old woman has nonischemic cardiomyopathy with ejection fraction of 20 to 25% of less than 20% with global hypokinesis and a severely dilated LV cavity at 7.5 cm during diastole and 6.9 cm during systole.  Stroke-volume index is 15.  There is moderate to severe mitral regurgitation.  The aortic valve is trileaflet with no regurgitation or stenosis.  Right ventricular systolic function is mildly reduced with normal RV size.  There is mild TR.  There is no sign of atrial level shunt.  She had NYHA class IV symptoms on admission with low Co-ox of 47% that increased to 56% on milrinone 0.25.  There is concern about increasing her milrinone any further due to her propensity for VT. Dr. [name] did right heart catheterization today which showed persistently elevated filling pressures and a low cardiac index of 1.6.  Her creatinine is 1.35 which has been about her baseline.  Pulmonary  function testing shows mild obstructive disease with an FEV1 of 1.72 which is 76% of predicted and FVC of 2.09 which is 74% of predicted.  Diffusion capacity was 93% of predicted.  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  Dr. [name] discussed this with OB/GYN and they did not feel the patient was a candidate for surgical treatment at this time and this could be followed.  She has definitely reached the point of needing advanced therapies for end-stage heart failure.  She is not felt to be a transplant candidate due to her BMI of 39-40.  I think she would be a reasonable candidate for left ventricular assist device therapy using a HeartMate 3.  Her right heart cath today did show low cardiac output with elevated right and left heart filling pressures and I agree with Dr. [name] that is probably best to insert a right subclavian  Impella 5.5 to allow further tune up prior to anticipated LVAD surgery.  I will try to do this tomorrow afternoon.  I discussed LVAD surgery with her, alternatives of continued medical therapy or palliative therapy, benefits and risks of surgery.  I also discussed insertion of an Impella 5.5 by the right subclavian artery as well as the benefits and risk of that including possible injury to the brachial plexus causing pain and/or weakness in the shoulder right arm, bleeding, vascular injury, and she understands and agrees to proceed.   Gaye Pollack 08/28/2021, 12:32 PM

## 2021-08-28 NOTE — H&P (View-Only) (Signed)
Patient ID: [name], female   DOB: 05-29-1971, 51 y.o.   MRN: [id number]     Advanced Heart Failure Rounding Note  PCP-Cardiologist: [name], MD   Subjective:    3/6 Admitted w/ a/c systolic heart failure, NYHA Class IIIb-IV w/ low output, Started on empiric milrinone 0.25 + lasix gtt. Amiodarone gtt started for frequent NSVT. VAD W/u begun 3/6 Tx for hyperthyroidism. TSH undetectable. Free T4 5.11. Started on Methimazole + prednisone. Levothyroxine d/c   Patient is on milrinone 0.25, co-ox 56% this morning.  Feels "dry."  CVP 7 overnight, PICC port not working this morning. She remains on Lasix 10 mg/hr. Creatinine stable 1.35.    Still with short NSVT runs on amiodarone gtt and quinidine.   CT abdomen/pelvis showed left ovarian cystic mass, pelvic US showed complex cystic left ovarian mass.  Unable to get MRI with ICD.    Objective:   Weight Range: 101.3 kg Body mass index is 39.56 kg/m.   Vital Signs:   Temp:  [97.7 F (36.5 C)-98.8 F (37.1 C)] 97.7 F (36.5 C) (03/08 0731) Pulse Rate:  [109-131] 109 (03/08 0600) Resp:  [17-29] 26 (03/08 0600) BP: (85-114)/(51-89) 101/78 (03/08 0600) SpO2:  [92 %-100 %] 99 % (03/08 0600) Weight:  [101.3 kg] 101.3 kg (03/08 0500)    Weight change: Filed Weights   08/26/21 0243 08/27/21 0500 08/28/21 0500  Weight: 104.5 kg 101 kg 101.3 kg    Intake/Output:   Intake/Output Summary (Last 24 hours) at 08/28/2021 0750 Last data filed at 08/28/2021 0650 Gross per 24 hour  Intake 1336.35 ml  Output 1400 ml  Net -63.65 ml      Physical Exam    CVP 7  General: NAD Neck: JVP 8 cm, no thyromegaly or thyroid nodule.  Lungs: Clear to auscultation bilaterally with normal respiratory effort. CV: Lateral PMI.  Heart mildly tachy, regular S1/S2, no S3/S4, no murmur.  No peripheral edema.   Abdomen: Soft, nontender, no hepatosplenomegaly, no distention.  Skin: Intact without lesions or rashes.  Neurologic: Alert and oriented x 3.   Psych: Normal affect. Extremities: No clubbing or cyanosis.  HEENT: Normal.    Telemetry   Sinus tachy, 110s, brief NSVT runs (personally reviewed)  EKG    No new EKG to review   Labs    CBC Recent Labs    08/26/21 0137 08/28/21 0415  WBC 11.0* 17.4*  HGB 10.4* 10.4*  HCT 34.1* 34.4*  MCV 71.8* 73.2*  PLT 342 528   Basic Metabolic Panel Recent Labs    08/27/21 0405 08/27/21 1650 08/28/21 0415  NA 132* 134* 132*  K 2.9* 4.0 3.6  CL 98 100 96*  CO2 '24 23 23  '$ GLUCOSE 197* 183* 292*  BUN '14 16 16  '$ CREATININE 1.24* 1.28* 1.35*  CALCIUM 8.6* 8.8* 8.7*  MG 1.9  --  2.3   Liver Function Tests Recent Labs    08/25/21 1914 08/26/21 0137  AST 29 32  ALT 38 39  ALKPHOS 86 84  BILITOT 0.7 0.5  PROT 6.6 6.4*  ALBUMIN 3.1* 3.0*   No results for input(s): LIPASE, AMYLASE in the last 72 hours. Cardiac Enzymes No results for input(s): CKTOTAL, CKMB, CKMBINDEX, TROPONINI in the last 72 hours.  BNP: BNP (last 3 results) Recent Labs    07/18/21 1647 08/21/21 1208 08/25/21 1728  BNP 1,331.6* 957.7* 1,117.0*    ProBNP (last 3 results) No results for input(s): PROBNP in the last 8760 hours.  D-Dimer Recent Labs    08/25/21 1914  DDIMER 1.32*   Hemoglobin A1C Recent Labs    08/27/21 0907  HGBA1C 5.1   Fasting Lipid Panel Recent Labs    08/26/21 1720  CHOL 100  HDL 26*  LDLCALC 55  TRIG 95  CHOLHDL 3.8   Thyroid Function Tests Recent Labs    08/26/21 0137  TSH <0.010*    Other results:   Imaging    DG Orthopantogram  Result Date: 08/27/2021 CLINICAL DATA:  Screening prior to possible cardiac surgery. No chest complaints. EXAM: ORTHOPANTOGRAM/PANORAMIC COMPARISON:  None. FINDINGS: Normal appearing teeth without caries or periapical lucency. IMPRESSION: 1. Normal. Electronically Signed   By: Titus Dubin M.D.   On: 08/27/2021 12:55   US PELVIC COMPLETE W TRANSVAGINAL AND TORSION R/O  Result Date: 08/27/2021 CLINICAL DATA:   Ovarian mass. EXAM: TRANSABDOMINAL AND TRANSVAGINAL ULTRASOUND OF PELVIS DOPPLER ULTRASOUND OF OVARIES TECHNIQUE: Both transabdominal and transvaginal ultrasound examinations of the pelvis were performed. Transabdominal technique was performed for global imaging of the pelvis including uterus, ovaries, adnexal regions, and pelvic cul-de-sac. It was necessary to proceed with endovaginal exam following the transabdominal exam to visualize the ovaries. Color and duplex Doppler ultrasound was utilized to evaluate blood flow to the ovaries. COMPARISON:  August 26, 2021. FINDINGS: Uterus Measurements: 12.2 x 7.1 x 4.5 cm = volume: 203 mL. No fibroids or other mass visualized. Endometrium Thickness: 9 mm which is within normal limits. No focal abnormality visualized. Right ovary Measurements: 2.9 x 1.8 x 1.7 cm = volume: 4 mL. Normal appearance/no adnexal mass. Left ovary Measurements: 12.0 x 9.4 x 4.8 cm = volume: 284 mL. Large complex multi-septated cystic mass is seen essentially replacing the left ovary. Pulsed Doppler evaluation of both ovaries demonstrates normal low-resistance arterial and venous waveforms. Other findings No abnormal free fluid. 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 MRI is recommended for further evaluation. Consultation with gynecologic surgery is recommended as well. Electronically Signed   By: Marijo Conception M.D.   On: 08/27/2021 18:39     Medications:     Scheduled Medications:  (feeding supplement) PROSource Plus  30 mL Oral TID BM   [START ON 08/29/2021] aspirin EC  81 mg Oral Daily   Chlorhexidine Gluconate Cloth  6 each Topical Daily   dapagliflozin propanediol  10 mg Oral Daily   digoxin  0.0625 mg Oral Daily   heparin  5,000 Units Subcutaneous Q8H   insulin aspart  0-15 Units Subcutaneous TID WC   insulin aspart  0-5 Units Subcutaneous QHS   mouth rinse  15 mL Mouth Rinse BID   methimazole  10 mg Oral  Daily   methimazole  5 mg Oral QPM   multivitamin with minerals  1 tablet Oral Daily   potassium chloride  40 mEq Oral Once   predniSONE  20 mg Oral Q breakfast   quiniDINE gluconate  324 mg Oral BID   rosuvastatin  40 mg Oral QHS   sodium chloride flush  10-40 mL Intracatheter Q12H   sodium chloride flush  3 mL Intravenous Q12H   sodium chloride flush  3 mL Intravenous Q12H   spironolactone  25 mg Oral Daily    Infusions:  sodium chloride     sodium chloride     sodium chloride     amiodarone 30 mg/hr (08/28/21 0650)   milrinone 0.25 mcg/kg/min (08/28/21 0650)    PRN Medications:  sodium chloride, sodium chloride, acetaminophen, ondansetron (ZOFRAN) IV, sodium chloride flush, sodium chloride flush, sodium chloride flush    Patient Profile   [name] is a 51 y.o. 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 Started on empiric milrinone and lasix gtt.   Assessment/Plan    1. Acute on chronic systolic HF: Nonischemic cardiomyopathy.  She has a [employer] ICD. Cause for cardiomyopathy is uncertain, initially identified peri-partum (after son's birth), so cannot rule out peri-partum CMP.  On and off, she has had frequent PVCs.   RHC in 8/15 showed normal filling pressures and preserved cardiac output. Echo in 4/22 with EF 25%, severe LV dilation.  CPX (8/22) with mild-moderate HF limitation.  Echo this admission with EF < 20% with severe dilation, normal RV size with mildly decreased systolic function, moderate-severe functional MR with dilated IVC. She was admitted with volume overload, NYHA class IV symptoms and low output. HCO3 18 and creatinine 1.5 on admit, co-ox 47%. Milrinone 0.25 started + lasix gtt.  I also suspect hyperthyroidism is playing a role in  her CHF exacerbation. Today, she remains on milrinone 0.25. Co-ox 56%. She has diuresed, now CVP 8.   - Continue milrinone 0.25 for now - Stop Lasix today - Hold Entresto for now with soft BP.  - Hold Coreg with marked volume overload.     - Continue spironolactone 25 mg daily.   - Continue digoxin 0.0625 daily (lower dose).  Level was too high with digoxin 0.125 and concomitant quinidine in the past, will check level on lower dose digoxin tomorrow.  - Continue dapagliflozin 10 mg daily.  - ECG with LBBB, QRS 140 msec => with QRS < 150, Dr. [name] has not recommended CRT upgrade in the past.  - Patient has had recurrent CHF exacerbations and we have had to cut back on her outpatient meds. Low output HF this admission.  At this point, I think she needs advanced therapies (LVAD).  BMI too high currently for transplant (40.8), also with ovarian mass of uncertain etiology.  She is undergoing LVAD workup.  Plan for RHC with Swan placement today, aim for Impella 5.5 placement later this week.  - She is a candidate for BAT, but concerned at this point we may be nearing LVAD.  2. Pulmonary HTN: Mixed picture with PVR 8.1 on RHC at [facility].  However, repeat study in 8/15 here showed no significant pulmonary arterial hypertension. 2017 RHC with pulmonary venous hypertension.  3. Morbid obesity: BMI still appears out of range for heart transplant.  4. 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  She has been on Levoxyl at home but does not think she has taken for a month.  Now hyperthyroid.  This likely contributes to her CHF exacerbation. I asked [facility] to evaluate her for this. NSVT on milrinone makes it difficult for [name] to stop amiodarone.  - Started on prednisone 20 mg daily and methimazole bid.  - Will need outpatient endocrinology followup.  - As above, continuing amiodarone for now with NSVT.  5. PVCs: 11% PVCs + NSVT on 2/20 Zio patch.  Amiodarone started, but PVCs  up to 13.3% with NSVT on 3/20 Zio patch. She saw Dr. [name] who thought that the ventricular arrhythmias  were electromechanical due to worsening HF.  In 4/20, she had VT x 2 with ICD discharges in the setting of hypokalemia. Zio patch in 1/21 with PVCs down to 6.2% of beats. VT again in 2/22, quinidine started.  Initially w/ frequent NSVT on milrinone gtt. Started on amio gtt, less ectopy today but still has short NSVT runs.  - Continue amiodarone gtt. - Continue quinidine.  - Would like to get her off milrinone with Impella placement to limit NSVT.  - EP consulted given complicated situation with hyperthyroidism and NSVT. They favor continuing amiodarone and quinadine at this time. No additional AAD therapy  6. CVA: CVA in 10/21, had thrombectomy with stent placement.  No LV thrombus noted and no atrial fibrillation noted.  - Talked with interventional radiology. >1 year post-stent, ok to stop ticagrelor and continue ASA.  7. Fe deficiency: She reports heavy periods.  Transferrin saturation low.  - Feraheme given 3/6.  8. CKD stage 3: Creatinine 1.5 on admit, 1.35 today. Follow with milrinone and diuresis.  9. Mitral regurgitation: Moderate-severe functional MR.  Unlikely to be Mitraclip candidate with severely dilated LV (>7 cm).  10. 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  Unable to get MRI with ICD. Discussed with GYN-oncology (Dr. Delsa Sale).  Possible low grade malignancy, could also be benign.  No ascites noted on abdominal CT and no mention of abdominal spread.  She is not a surgical candidate at this time with low output HF.  - Plan at this point would be to stabilize with LVAD then investigate the ovarian mass.  - Will order CEA and CA 125 tumor markers.   CRITICAL CARE Performed by: [name]  Total critical care time: 45 minutes  Critical care time was exclusive of separately billable procedures  and treating other patients.  Critical care was necessary to treat or prevent imminent or life-threatening deterioration.  Critical care was time spent personally by me on the following activities: development of treatment plan with patient and/or surrogate as well as nursing, discussions with consultants, evaluation of patient's response to treatment, examination of patient, obtaining history from patient or surrogate, ordering and performing treatments and interventions, ordering and review of laboratory studies, ordering and review of radiographic studies, pulse oximetry and re-evaluation of patient's condition.  Length of Stay: 2  [name], MD  08/28/2021, 7:50 AM  Advanced Heart Failure Team Pager [phone] (M-F; 7a - 5p)  Please contact [facility] for night-coverage after hours (5p -7a ) and weekends on [email or web address]

## 2021-08-28 NOTE — Progress Notes (Signed)
This chaplain responded to PMT consult for spiritual care. The Pt. is out of the room at this time. The Pt. RN-Linzey updated the chaplain on the Pt. procedure schedule for today. This chaplain will attempt a revisit at the best time for the Pt. and chaplain. ? ?Chaplain Sallyanne Kuster ?[phone] ?

## 2021-08-28 NOTE — Progress Notes (Signed)
Reviewed case with Dr. [name].  ? ?No change to current plan. Continue amiodarone + quinidine as tolerated. No real role for further adjunctive AAD.  ? ?EP to follow from a distance. Please call with questions.  ? ?Lollie Marrow, PA-C  ?08/28/2021 1:26 PM  ?

## 2021-08-28 NOTE — Progress Notes (Addendum)
Patient ID: [name], female   DOB: 08-14-1970, 51 y.o.   MRN: [id number]     Advanced Heart Failure Rounding Note  PCP-Cardiologist: [name], MD   Subjective:    3/6 Admitted w/ a/c systolic heart failure, NYHA Class IIIb-IV w/ low output, Started on empiric milrinone 0.25 + lasix gtt. Amiodarone gtt started for frequent NSVT. VAD W/u begun 3/6 Tx for hyperthyroidism. TSH undetectable. Free T4 5.11. Started on Methimazole + prednisone. Levothyroxine d/c   Patient is on milrinone 0.25, co-ox 56% this morning.  Feels "dry."  CVP 7 overnight, PICC port not working this morning. She remains on Lasix 10 mg/hr. Creatinine stable 1.35.    Still with short NSVT runs on amiodarone gtt and quinidine.   CT abdomen/pelvis showed left ovarian cystic mass, pelvic US showed complex cystic left ovarian mass.  Unable to get MRI with ICD.    Objective:   Weight Range: 101.3 kg Body mass index is 39.56 kg/m.   Vital Signs:   Temp:  [97.7 F (36.5 C)-98.8 F (37.1 C)] 97.7 F (36.5 C) (03/08 0731) Pulse Rate:  [109-131] 109 (03/08 0600) Resp:  [17-29] 26 (03/08 0600) BP: (85-114)/(51-89) 101/78 (03/08 0600) SpO2:  [92 %-100 %] 99 % (03/08 0600) Weight:  [101.3 kg] 101.3 kg (03/08 0500)    Weight change: Filed Weights   08/26/21 0243 08/27/21 0500 08/28/21 0500  Weight: 104.5 kg 101 kg 101.3 kg    Intake/Output:   Intake/Output Summary (Last 24 hours) at 08/28/2021 0750 Last data filed at 08/28/2021 0650 Gross per 24 hour  Intake 1336.35 ml  Output 1400 ml  Net -63.65 ml      Physical Exam    CVP 7  General: NAD Neck: JVP 8 cm, no thyromegaly or thyroid nodule.  Lungs: Clear to auscultation bilaterally with normal respiratory effort. CV: Lateral PMI.  Heart mildly tachy, regular S1/S2, no S3/S4, no murmur.  No peripheral edema.   Abdomen: Soft, nontender, no hepatosplenomegaly, no distention.  Skin: Intact without lesions or rashes.  Neurologic: Alert and oriented x 3.   Psych: Normal affect. Extremities: No clubbing or cyanosis.  HEENT: Normal.    Telemetry   Sinus tachy, 110s, brief NSVT runs (personally reviewed)  EKG    No new EKG to review   Labs    CBC Recent Labs    08/26/21 0137 08/28/21 0415  WBC 11.0* 17.4*  HGB 10.4* 10.4*  HCT 34.1* 34.4*  MCV 71.8* 73.2*  PLT 342 622   Basic Metabolic Panel Recent Labs    08/27/21 0405 08/27/21 1650 08/28/21 0415  NA 132* 134* 132*  K 2.9* 4.0 3.6  CL 98 100 96*  CO2 '24 23 23  '$ GLUCOSE 197* 183* 292*  BUN '14 16 16  '$ CREATININE 1.24* 1.28* 1.35*  CALCIUM 8.6* 8.8* 8.7*  MG 1.9  --  2.3   Liver Function Tests Recent Labs    08/25/21 1914 08/26/21 0137  AST 29 32  ALT 38 39  ALKPHOS 86 84  BILITOT 0.7 0.5  PROT 6.6 6.4*  ALBUMIN 3.1* 3.0*   No results for input(s): LIPASE, AMYLASE in the last 72 hours. Cardiac Enzymes No results for input(s): CKTOTAL, CKMB, CKMBINDEX, TROPONINI in the last 72 hours.  BNP: BNP (last 3 results) Recent Labs    07/18/21 1647 08/21/21 1208 08/25/21 1728  BNP 1,331.6* 957.7* 1,117.0*    ProBNP (last 3 results) No results for input(s): PROBNP in the last 8760 hours.  D-Dimer Recent Labs    08/25/21 1914  DDIMER 1.32*   Hemoglobin A1C Recent Labs    08/27/21 0907  HGBA1C 5.1   Fasting Lipid Panel Recent Labs    08/26/21 1720  CHOL 100  HDL 26*  LDLCALC 55  TRIG 95  CHOLHDL 3.8   Thyroid Function Tests Recent Labs    08/26/21 0137  TSH <0.010*    Other results:   Imaging    DG Orthopantogram  Result Date: 08/27/2021 CLINICAL DATA:  Screening prior to possible cardiac surgery. No chest complaints. EXAM: ORTHOPANTOGRAM/PANORAMIC COMPARISON:  None. FINDINGS: Normal appearing teeth without caries or periapical lucency. IMPRESSION: 1. Normal. Electronically Signed   By: Titus Dubin M.D.   On: 08/27/2021 12:55   US PELVIC COMPLETE W TRANSVAGINAL AND TORSION R/O  Result Date: 08/27/2021 CLINICAL DATA:   Ovarian mass. EXAM: TRANSABDOMINAL AND TRANSVAGINAL ULTRASOUND OF PELVIS DOPPLER ULTRASOUND OF OVARIES TECHNIQUE: Both transabdominal and transvaginal ultrasound examinations of the pelvis were performed. Transabdominal technique was performed for global imaging of the pelvis including uterus, ovaries, adnexal regions, and pelvic cul-de-sac. It was necessary to proceed with endovaginal exam following the transabdominal exam to visualize the ovaries. Color and duplex Doppler ultrasound was utilized to evaluate blood flow to the ovaries. COMPARISON:  August 26, 2021. FINDINGS: Uterus Measurements: 12.2 x 7.1 x 4.5 cm = volume: 203 mL. No fibroids or other mass visualized. Endometrium Thickness: 9 mm which is within normal limits. No focal abnormality visualized. Right ovary Measurements: 2.9 x 1.8 x 1.7 cm = volume: 4 mL. Normal appearance/no adnexal mass. Left ovary Measurements: 12.0 x 9.4 x 4.8 cm = volume: 284 mL. Large complex multi-septated cystic mass is seen essentially replacing the left ovary. Pulsed Doppler evaluation of both ovaries demonstrates normal low-resistance arterial and venous waveforms. Other findings No abnormal free fluid. 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 MRI is recommended for further evaluation. Consultation with gynecologic surgery is recommended as well. Electronically Signed   By: Marijo Conception M.D.   On: 08/27/2021 18:39     Medications:     Scheduled Medications:  (feeding supplement) PROSource Plus  30 mL Oral TID BM   [START ON 08/29/2021] aspirin EC  81 mg Oral Daily   Chlorhexidine Gluconate Cloth  6 each Topical Daily   dapagliflozin propanediol  10 mg Oral Daily   digoxin  0.0625 mg Oral Daily   heparin  5,000 Units Subcutaneous Q8H   insulin aspart  0-15 Units Subcutaneous TID WC   insulin aspart  0-5 Units Subcutaneous QHS   mouth rinse  15 mL Mouth Rinse BID   methimazole  10 mg Oral  Daily   methimazole  5 mg Oral QPM   multivitamin with minerals  1 tablet Oral Daily   potassium chloride  40 mEq Oral Once   predniSONE  20 mg Oral Q breakfast   quiniDINE gluconate  324 mg Oral BID   rosuvastatin  40 mg Oral QHS   sodium chloride flush  10-40 mL Intracatheter Q12H   sodium chloride flush  3 mL Intravenous Q12H   sodium chloride flush  3 mL Intravenous Q12H   spironolactone  25 mg Oral Daily    Infusions:  sodium chloride     sodium chloride     sodium chloride     amiodarone 30 mg/hr (08/28/21 0650)   milrinone 0.25 mcg/kg/min (08/28/21 0650)    PRN Medications:  sodium chloride, sodium chloride, acetaminophen, ondansetron (ZOFRAN) IV, sodium chloride flush, sodium chloride flush, sodium chloride flush    Patient Profile   EMMANUELLA MIRANTE is a 51 y.o. 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 Started on empiric milrinone and lasix gtt.   Assessment/Plan    1. Acute on chronic systolic HF: Nonischemic cardiomyopathy.  She has a [employer] ICD. Cause for cardiomyopathy is uncertain, initially identified peri-partum (after son's birth), so cannot rule out peri-partum CMP.  On and off, she has had frequent PVCs.   RHC in 8/15 showed normal filling pressures and preserved cardiac output. Echo in 4/22 with EF 25%, severe LV dilation.  CPX (8/22) with mild-moderate HF limitation.  Echo this admission with EF < 20% with severe dilation, normal RV size with mildly decreased systolic function, moderate-severe functional MR with dilated IVC. She was admitted with volume overload, NYHA class IV symptoms and low output. HCO3 18 and creatinine 1.5 on admit, co-ox 47%. Milrinone 0.25 started + lasix gtt.  I also suspect hyperthyroidism is playing a role in  her CHF exacerbation. Today, she remains on milrinone 0.25. Co-ox 56%. She has diuresed, now CVP 8.   - Continue milrinone 0.25 for now - Stop Lasix today - Hold Entresto for now with soft BP.  - Hold Coreg with marked volume overload.     - Continue spironolactone 25 mg daily.   - Continue digoxin 0.0625 daily (lower dose).  Level was too high with digoxin 0.125 and concomitant quinidine in the past, will check level on lower dose digoxin tomorrow.  - Continue dapagliflozin 10 mg daily.  - ECG with LBBB, QRS 140 msec => with QRS < 150, Dr. [name] has not recommended CRT upgrade in the past.  - Patient has had recurrent CHF exacerbations and we have had to cut back on her outpatient meds. Low output HF this admission.  At this point, I think she needs advanced therapies (LVAD).  BMI too high currently for transplant (40.8), also with ovarian mass of uncertain etiology.  She is undergoing LVAD workup.  Plan for RHC with Swan placement today, aim for Impella 5.5 placement later this week.  - She is a candidate for BAT, but concerned at this point we may be nearing LVAD.  2. Pulmonary HTN: Mixed picture with PVR 8.1 on RHC at [facility].  However, repeat study in 8/15 here showed no significant pulmonary arterial hypertension. 2017 RHC with pulmonary venous hypertension.  3. Morbid obesity: BMI still appears out of range for heart transplant.  4. 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  She has been on Levoxyl at home but does not think she has taken for a month.  Now hyperthyroid.  This likely contributes to her CHF exacerbation. I asked [facility] to evaluate her for this. NSVT on milrinone makes it difficult for [name] to stop amiodarone.  - Started on prednisone 20 mg daily and methimazole bid.  - Will need outpatient endocrinology followup.  - As above, continuing amiodarone for now with NSVT.  5. PVCs: 11% PVCs + NSVT on 2/20 Zio patch.  Amiodarone started, but PVCs  up to 13.3% with NSVT on 3/20 Zio patch. She saw Dr. [name] who thought that the ventricular arrhythmias  were electromechanical due to worsening HF.  In 4/20, she had VT x 2 with ICD discharges in the setting of hypokalemia. Zio patch in 1/21 with PVCs down to 6.2% of beats. VT again in 2/22, quinidine started.  Initially w/ frequent NSVT on milrinone gtt. Started on amio gtt, less ectopy today but still has short NSVT runs.  - Continue amiodarone gtt. - Continue quinidine.  - Would like to get her off milrinone with Impella placement to limit NSVT.  - EP consulted given complicated situation with hyperthyroidism and NSVT. They favor continuing amiodarone and quinadine at this time. No additional AAD therapy  6. CVA: CVA in 10/21, had thrombectomy with stent placement.  No LV thrombus noted and no atrial fibrillation noted.  - Talked with interventional radiology. >1 year post-stent, ok to stop ticagrelor and continue ASA.  7. Fe deficiency: She reports heavy periods.  Transferrin saturation low.  - Feraheme given 3/6.  8. CKD stage 3: Creatinine 1.5 on admit, 1.35 today. Follow with milrinone and diuresis.  9. Mitral regurgitation: Moderate-severe functional MR.  Unlikely to be Mitraclip candidate with severely dilated LV (>7 cm).  10. 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  Unable to get MRI with ICD. Discussed with GYN-oncology (Dr. Delsa Sale).  Possible low grade malignancy, could also be benign.  No ascites noted on abdominal CT and no mention of abdominal spread.  She is not a surgical candidate at this time with low output HF.  - Plan at this point would be to stabilize with LVAD then investigate the ovarian mass.  - Will order CEA and CA 125 tumor markers.   CRITICAL CARE Performed by: [name]  Total critical care time: 45 minutes  Critical care time was exclusive of separately billable procedures  and treating other patients.  Critical care was necessary to treat or prevent imminent or life-threatening deterioration.  Critical care was time spent personally by me on the following activities: development of treatment plan with patient and/or surrogate as well as nursing, discussions with consultants, evaluation of patient's response to treatment, examination of patient, obtaining history from patient or surrogate, ordering and performing treatments and interventions, ordering and review of laboratory studies, ordering and review of radiographic studies, pulse oximetry and re-evaluation of patient's condition.  Length of Stay: 2  [name], MD  08/28/2021, 7:50 AM  Advanced Heart Failure Team Pager [phone] (M-F; 7a - 5p)  Please contact [facility] for night-coverage after hours (5p -7a ) and weekends on [email or web address]

## 2021-08-28 NOTE — Interval H&P Note (Signed)
History and Physical Interval Note: ? ?08/28/2021 ?12:05 PM ? ?[name]  has presented today for surgery, with the diagnosis of heart failure.  The various methods of treatment have been discussed with the patient and family. After consideration of risks, benefits and other options for treatment, the patient has consented to  Procedure(s): ?RIGHT HEART CATH (N/A) as a surgical intervention.  The patient's history has been reviewed, patient examined, no change in status, stable for surgery.  I have reviewed the patient's chart and labs.  Questions were answered to the patient's satisfaction.   ? ? ?Samanta Gal [name] ? ? ?

## 2021-08-28 NOTE — H&P (View-Only) (Signed)
[address]       [place] [address]             [phone]      Cardiothoracic Surgery Consultation  Reason for Consult: Acute on chronic systolic heart failure due to nonischemic cardiomyopathy. Referring Physician: Dr. [name]  DARSHANA CURNUTT is an 51 y.o. female.  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  The cause of her cardiomyopathy is unclear.  It was initially identified after her son's birth so it may be peripartum.  She had a CPX in August 2022 showing mild to moderate heart failure limitation.  Echo during this admission shows ejection fraction of less than 20% with severe LV dilatation greater than 7 cm the right ventricle was normal in size with mildly decreased function.  There is moderate to severe functional MR.  Co. ox was 47% on admission.  She was started on milrinone 1.25 and Lasix drip with improvement in her, ox to 56%.  She was last admitted to [facility] in January with heart failure exacerbation and treated with diuresis.  She said that she never really felt well after leaving the hospital and has had ongoing shortness of breath with exertion as well as orthopnea.  She reports having abdominal swelling but no peripheral edema.  Over the few days before admission she reports worsening shortness of breath and fatigue as well as nausea and vomiting x3 and some chest tightness.  She lives in [place] with her husband.  She has 2 daughters who are adults.  She has never smoked and denies alcohol or drug use.  Past Medical History:   Diagnosis Date   Automatic implantable cardioverter-defibrillator in situ    Chronic CHF (congestive heart failure) ([facility])    a. EF 15-20% b. RHC (09/2013) RA 14, RV 57/22, PA 64/36 (48), PCWP 18, FIck CO/CI 3.7/1.6, PVR 8.1 WU, PA sat 47%    History of stomach ulcers    Hypertension    Hypotension    Hypothyroidism    Morbid obesity ([facility])    Myocardial infarction ([facility]) 08/2013   Nocturnal dyspnea    Nonischemic cardiomyopathy ([facility])    Sinus tachycardia    Sleep apnea    Snoring-prob OSA 09/04/2011   Sprint Fidelis ICD lead RECALL  6949    UARS (upper airway resistance syndrome) 09/04/2011   HST 12/2013:  AHI 4/hr (numerous episodes of airflow reduction that did not have concomitant desaturation)     Past Surgical History:  Procedure Laterality Date   BREATH TEK H PYLORI N/A 11/09/2014   Procedure: BREATH TEK H PYLORI;  Surgeon: Greer Pickerel, MD;  Location: [facility];  Service: General;  Laterality: N/A;   CARDIAC CATHETERIZATION  ~ 2006; 09/2013   CARDIAC CATHETERIZATION N/A 05/23/2016   Procedure: Right Heart Cath;  Surgeon: Larey Dresser, MD;  Location: [facility];  Service: Cardiovascular;  Laterality: N/A;   CARDIAC DEFIBRILLATOR PLACEMENT  2006; 12/26/2013   Medtronic [id number]; 12-2013 ICD gen change and RV lead revision with new 6935 RV lead by Dr Alveta Heimlich  SECTION  1999   IMPLANTABLE CARDIOVERTER DEFIBRILLATOR GENERATOR CHANGE N/A 12/26/2013   Procedure: IMPLANTABLE CARDIOVERTER DEFIBRILLATOR GENERATOR CHANGE;  Surgeon: Deboraha Sprang, MD;  Location: [facility] CATH LAB;  Service: Cardiovascular;  Laterality: N/A;   IR CT HEAD LTD  04/17/2020   IR CT HEAD LTD  04/17/2020   IR INTRA CRAN STENT  04/17/2020   IR PERCUTANEOUS ART THROMBECTOMY/INFUSION INTRACRANIAL INC DIAG ANGIO  04/17/2020   IR RADIOLOGIST EVAL & MGMT  06/05/2020   LEAD REVISION N/A 12/26/2013   Procedure: LEAD REVISION;  Surgeon: Deboraha Sprang, MD;  Location: [facility] CATH LAB;  Service: Cardiovascular;   Laterality: N/A;   RADIOLOGY WITH ANESTHESIA N/A 04/17/2020   Procedure: Code Stroke;  Surgeon: Luanne Bras, MD;  Location: [facility];  Service: Radiology;  Laterality: N/A;   RIGHT HEART CATHETERIZATION N/A 02/15/2014   Procedure: RIGHT HEART CATH;  Surgeon: Larey Dresser, MD;  Location: [facility] C[facility] LAB;  Service: Cardiovascular;  Laterality: N/A;   TUBAL LIGATION  1999    Family History  Problem Relation Age of Onset   Heart disease Maternal Grandmother    Heart disease Mother    Obesity Mother    High blood pressure Father    Stroke Father     Social History:  reports that she has never smoked. She has never used smokeless tobacco. She reports that she does not drink alcohol and does not use drugs.  Allergies: No Known Allergies  Medications: I have reviewed the patient's current medications. Prior to Admission:  Medications Prior to Admission  Medication Sig Dispense Refill Last Dose   acetaminophen (TYLENOL) 500 MG tablet Take 500 mg by mouth every 6 (six) hours as needed for moderate pain or headache.   Past Month   amiodarone (PACERONE) 200 MG tablet TAKE 1 TABLET BY MOUTH ONCE DAILY (Patient taking differently: Take 200 mg by mouth daily.) 90 tablet 1 08/25/2021   ASPIRIN LOW DOSE 81 MG EC tablet TAKE 1 TABLET BY MOUTH ONCE A DAY (Patient taking differently: Take 81 mg by mouth daily.) 30 tablet 11 08/25/2021   BRILINTA 90 MG TABS tablet TAKE 1 TABLET BY MOUTH TWICE A DAY (Patient taking differently: Take 90 mg by mouth 2 (two) times daily.) 60 tablet 11 08/25/2021   carvedilol (COREG) 3.125 MG tablet Take 1 tablet (3.125 mg total) by mouth 2 (two) times daily with a meal. 60 tablet 0 08/25/2021 at 0900   ENTRESTO 24-26 MG TAKE 1 TABLET BY MOUTH TWICE A DAY (Patient taking differently: Take 1 tablet by mouth 2 (two) times daily.) 60 tablet 11 08/25/2021   FARXIGA 10 MG TABS tablet TAKE 1 TABLET BY MOUTH ONCE A DAY BEFOREBREAKFAST (Patient taking differently: Take 10 mg by mouth daily.)  30 tablet 4 08/25/2021   levothyroxine (SYNTHROID) 25 MCG tablet Take 1 tablet (25 mcg total) by mouth daily before breakfast. 90 tablet 3 08/25/2021   metolazone (ZAROXOLYN) 2.5 MG tablet Take 1 tablet (2.5 mg total) by mouth as needed. Take 1 tablet with extra 40 mEq of KCL only when directed by AHF clinic (Patient taking differently: Take 2.5 mg by mouth daily as needed (fluid when directed by doctor).) 5 tablet 0 08/22/2021   potassium chloride SA (KLOR-CON M) 20 MEQ tablet Take 40 mEq by mouth 2 (two) times daily.   08/25/2021   quiniDINE gluconate 324 MG CR tablet TAKE 1 TABLET BY MOUTH TWICE A DAY (Patient taking differently: Take 324 mg by mouth 2 (two) times  daily.) 60 tablet 6 08/25/2021   rosuvastatin (CRESTOR) 40 MG tablet Take 1 tablet (40 mg total) by mouth daily. (Patient taking differently: Take 40 mg by mouth at bedtime.) 30 tablet 5 08/25/2021   spironolactone (ALDACTONE) 25 MG tablet TAKE 1 TABLET BY MOUTH EVERY NIGHT AT BEDTIME (Patient taking differently: Take 25 mg by mouth at bedtime.) 90 tablet 3 08/24/2021   torsemide (DEMADEX) 20 MG tablet Take 4 tablets (80 mg total) by mouth every morning AND 2 tablets (40 mg total) every evening. (Patient taking differently: Take 4 tablets (80 mg total) by mouth every morning AND 3 tablets (40 mg total) every evening.) 540 tablet 2 08/25/2021   Scheduled:  [MAR Hold] (feeding supplement) PROSource Plus  30 mL Oral TID BM   [MAR Hold] aspirin EC  81 mg Oral Daily   [MAR Hold] Chlorhexidine Gluconate Cloth  6 each Topical Daily   [MAR Hold] dapagliflozin propanediol  10 mg Oral Daily   [MAR Hold] digoxin  0.0625 mg Oral Daily   [MAR Hold] heparin  5,000 Units Subcutaneous Q8H   [MAR Hold] insulin aspart  0-15 Units Subcutaneous TID WC   [MAR Hold] insulin aspart  0-5 Units Subcutaneous QHS   [MAR Hold] mouth rinse  15 mL Mouth Rinse BID   [MAR Hold] methimazole  10 mg Oral Daily   [MAR Hold] methimazole  5 mg Oral QPM   [MAR Hold] multivitamin with  minerals  1 tablet Oral Daily   [MAR Hold] predniSONE  20 mg Oral Q breakfast   [MAR Hold] quiniDINE gluconate  324 mg Oral BID   [MAR Hold] rosuvastatin  40 mg Oral QHS   [MAR Hold] sodium chloride flush  10-40 mL Intracatheter Q12H   [MAR Hold] sodium chloride flush  3 mL Intravenous Q12H   [MAR Hold] sodium chloride flush  3 mL Intravenous Q12H   [MAR Hold] spironolactone  25 mg Oral Daily   Continuous:  [MAR Hold] sodium chloride     sodium chloride     sodium chloride     amiodarone 30 mg/hr (08/28/21 0650)   milrinone 0.375 mcg/kg/min (08/28/21 1244)   PRN:[MAR Hold] sodium chloride, sodium chloride, [MAR Hold] acetaminophen, fentaNYL, lidocaine (PF), midazolam, [MAR Hold] ondansetron (ZOFRAN) IV, [MAR Hold] ondansetron (ZOFRAN) IV, [MAR Hold] sodium chloride flush, [MAR Hold] sodium chloride flush, sodium chloride flush Anti-infectives (From admission, onward)    None       Results for orders placed or performed during the hospital encounter of 08/25/21 (from the past 48 hour(s))  .Cooxemetry Panel (carboxy, met, total hgb, O2 sat)     Status: Abnormal   Collection Time: 08/26/21  5:20 PM  Result Value Ref Range   Total hemoglobin 10.9 (L) 12.0 - 16.0 g/dL   O2 Saturation 46.8 %   Carboxyhemoglobin 0.9 0.5 - 1.5 %   Methemoglobin 0.7 0.0 - 1.5 %    Comment: Performed at [facility] [address] [place], [place] [address]  Prealbumin     Status: Abnormal   Collection Time: 08/26/21  5:20 PM  Result Value Ref Range   Prealbumin 13.1 (L) 18 - 38 mg/dL    Comment: Performed at [facility] [address], [place], [place] [address]  Hepatitis C antibody     Status: None   Collection Time: 08/26/21  5:20 PM  Result Value Ref Range   HCV Ab NON REACTIVE NON REACTIVE    Comment: (NOTE) Nonreactive HCV antibody screen  is consistent with no HCV infections,  unless recent infection is suspected or other evidence exists to indicate HCV  infection.  Performed at [facility], [place] [address] [place], [place] [address]   Hepatitis B core antibody, IgM     Status: None   Collection Time: 08/26/21  5:20 PM  Result Value Ref Range   Hep B C IgM NON REACTIVE NON REACTIVE    Comment: Performed at [facility] [address], [place], [place] [address]  Hepatitis B surface antigen     Status: None   Collection Time: 08/26/21  5:20 PM  Result Value Ref Range   Hepatitis B Surface Ag NON REACTIVE NON REACTIVE    Comment: Performed at [facility] [address], [place], [place] [address]  Hepatitis B surface antibody,quantitative     Status: Abnormal   Collection Time: 08/26/21  5:20 PM  Result Value Ref Range   Hepatitis B-Post <3.1 (L) Immunity>9.9 mIU/mL    Comment: (NOTE)  Status of Immunity                     Anti-HBs Level  ------------------                     -------------- Inconsistent with Immunity                   0.0 - 9.9 Consistent with Immunity                          >9.9 Performed At: [facility] [place], [place] [id number] Rush Farmer MD TD:[id number]   Antithrombin III     Status: None   Collection Time: 08/26/21  5:20 PM  Result Value Ref Range   AntiThromb III Func 97 75 - 120 %    Comment: Performed at [facility] [address], [place], [place] [address]  Lipid panel     Status: Abnormal   Collection Time: 08/26/21  5:20 PM  Result Value Ref Range   Cholesterol 100 0 - 200 mg/dL   Triglycerides 95 <150 mg/dL   HDL 26 (L) >40 mg/dL   Total CHOL/HDL Ratio 3.8 RATIO   VLDL 19 0 - 40 mg/dL   LDL Cholesterol 55 0 - 99 mg/dL    Comment:        Total Cholesterol/HDL:CHD Risk Coronary Heart Disease Risk Table                     Men   Women  1/2 Average Risk   3.4   3.3  Average Risk       5.0   4.4  2 X Average Risk   9.6   7.1  3 X Average Risk  23.4   11.0        Use the calculated Patient Ratio above and the CHD Risk Table to  determine the patient's CHD Risk.        ATP III CLASSIFICATION (LDL):  <100     mg/dL   Optimal  100-129  mg/dL   Near or Above                    Optimal  130-159  mg/dL   Borderline  160-189  mg/dL   High  >190     mg/dL   Very High Performed at [facility]  [facility], [place] [address] [place], [place] [address]   Uric acid     Status: Abnormal   Collection Time: 08/26/21  5:20 PM  Result Value Ref Range   Uric Acid, Serum 11.3 (H) 2.5 - 7.1 mg/dL    Comment: Performed at [facility] [address] [place], [place] [address]  Lactate dehydrogenase     Status: Abnormal   Collection Time: 08/26/21  5:20 PM  Result Value Ref Range   LDH 221 (H) 98 - 192 U/L    Comment: Performed at [facility] [address] [place], [place] [address]  Type and screen [place]     Status: None   Collection Time: 08/26/21  6:00 PM  Result Value Ref Range   ABO/RH(D) B POS    Antibody Screen NEG    Sample Expiration      08/29/2021,2359 Performed at [facility], [place] [address] [place], [place] [address]   Basic metabolic panel     Status: Abnormal   Collection Time: 08/27/21  4:05 AM  Result Value Ref Range   Sodium 132 (L) 135 - 145 mmol/L   Potassium 2.9 (L) 3.5 - 5.1 mmol/L    Comment: DELTA CHECK NOTED   Chloride 98 98 - 111 mmol/L   CO2 24 22 - 32 mmol/L   Glucose, Bld 197 (H) 70 - 99 mg/dL    Comment: Glucose reference range applies only to samples taken after fasting for at least 8 hours.   BUN 14 6 - 20 mg/dL   Creatinine, Ser 1.24 (H) 0.44 - 1.00 mg/dL   Calcium 8.6 (L) 8.9 - 10.3 mg/dL   GFR, Estimated 53 (L) >60 mL/min    Comment: (NOTE) Calculated using the CKD-EPI Creatinine Equation (2021)    Anion gap 10 5 - 15    Comment: Performed at [facility] [address] [place], [place] [address]  Magnesium     Status: None   Collection Time: 08/27/21  4:05 AM  Result Value Ref Range   Magnesium 1.9 1.7 - 2.4 mg/dL    Comment:  Performed at [facility] [address], [place], [place] [address]  .Cooxemetry Panel (carboxy, met, total hgb, O2 sat)     Status: Abnormal   Collection Time: 08/27/21  4:05 AM  Result Value Ref Range   Total hemoglobin 11.3 (L) 12.0 - 16.0 g/dL   O2 Saturation 54.7 %   Carboxyhemoglobin 0.9 0.5 - 1.5 %   Methemoglobin <0.7 0.0 - 1.5 %    Comment: Performed at [facility] [address] [place],  [address]  Hemoglobin A1c     Status: None   Collection Time: 08/27/21  9:07 AM  Result Value Ref Range   Hgb A1c MFr Bld 5.1 4.8 - 5.6 %    Comment: (NOTE) Pre diabetes:          5.7%-6.4%  Diabetes:              >6.4%  Glycemic control for   <7.0% adults with diabetes    Mean Plasma Glucose 99.67 mg/dL    Comment: Performed at [facility] [address] [place], [place] [address]  Glucose, capillary     Status: Abnormal   Collection Time: 08/27/21 12:46 PM  Result Value Ref Range   Glucose-Capillary 121 (H) 70 - 99 mg/dL    Comment: Glucose reference range  applies only to samples taken after fasting for at least 8 hours.  Glucose, capillary     Status: Abnormal   Collection Time: 08/27/21  4:41 PM  Result Value Ref Range   Glucose-Capillary 134 (H) 70 - 99 mg/dL    Comment: Glucose reference range applies only to samples taken after fasting for at least 8 hours.  Basic metabolic panel     Status: Abnormal   Collection Time: 08/27/21  4:50 PM  Result Value Ref Range   Sodium 134 (L) 135 - 145 mmol/L   Potassium 4.0 3.5 - 5.1 mmol/L    Comment: SLIGHT HEMOLYSIS DELTA CHECK NOTED    Chloride 100 98 - 111 mmol/L   CO2 23 22 - 32 mmol/L   Glucose, Bld 183 (H) 70 - 99 mg/dL    Comment: Glucose reference range applies only to samples taken after fasting for at least 8 hours.   BUN 16 6 - 20 mg/dL   Creatinine, Ser 1.28 (H) 0.44 - 1.00 mg/dL   Calcium 8.8 (L) 8.9 - 10.3 mg/dL   GFR, Estimated 51 (L) >60 mL/min    Comment: (NOTE) Calculated using  the CKD-EPI Creatinine Equation (2021)    Anion gap 11 5 - 15    Comment: Performed at [facility] [address], [place], [place] [address]  Glucose, capillary     Status: Abnormal   Collection Time: 08/27/21  9:32 PM  Result Value Ref Range   Glucose-Capillary 135 (H) 70 - 99 mg/dL    Comment: Glucose reference range applies only to samples taken after fasting for at least 8 hours.   Comment 1 Document in Chart   .Cooxemetry Panel (carboxy, met, total hgb, O2 sat)     Status: Abnormal   Collection Time: 08/28/21  4:13 AM  Result Value Ref Range   Total hemoglobin 10.6 (L) 12.0 - 16.0 g/dL   O2 Saturation 55.6 %   Carboxyhemoglobin 1.0 0.5 - 1.5 %   Methemoglobin 1.2 0.0 - 1.5 %    Comment: Performed at [facility], [place] [address] [place], [place] [address]  Basic metabolic panel     Status: Abnormal   Collection Time: 08/28/21  4:15 AM  Result Value Ref Range   Sodium 132 (L) 135 - 145 mmol/L   Potassium 3.6 3.5 - 5.1 mmol/L   Chloride 96 (L) 98 - 111 mmol/L   CO2 23 22 - 32 mmol/L   Glucose, Bld 292 (H) 70 - 99 mg/dL    Comment: Glucose reference range applies only to samples taken after fasting for at least 8 hours.   BUN 16 6 - 20 mg/dL   Creatinine, Ser 1.35 (H) 0.44 - 1.00 mg/dL   Calcium 8.7 (L) 8.9 - 10.3 mg/dL   GFR, Estimated 48 (L) >60 mL/min    Comment: (NOTE) Calculated using the CKD-EPI Creatinine Equation (2021)    Anion gap 13 5 - 15    Comment: Performed at [facility] [address], [place], [place] [address]  Magnesium     Status: None   Collection Time: 08/28/21  4:15 AM  Result Value Ref Range   Magnesium 2.3 1.7 - 2.4 mg/dL    Comment: Performed at [facility] [address] [place],  [address]  CBC     Status: Abnormal   Collection Time: 08/28/21  4:15 AM  Result Value Ref Range   WBC 17.4 (H) 4.0 - 10.5  K/uL   RBC 4.70 3.87 - 5.11 MIL/uL   Hemoglobin 10.4 (L) 12.0 - 15.0 g/dL   HCT 34.4 (L) 36.0 - 46.0 %    MCV 73.2 (L) 80.0 - 100.0 fL   MCH 22.1 (L) 26.0 - 34.0 pg   MCHC 30.2 30.0 - 36.0 g/dL   RDW 17.3 (H) 11.5 - 15.5 %   Platelets 321 150 - 400 K/uL   nRBC 1.2 (H) 0.0 - 0.2 %    Comment: Performed at [facility] [address], [place], [place] [address]  Glucose, capillary     Status: Abnormal   Collection Time: 08/28/21  7:28 AM  Result Value Ref Range   Glucose-Capillary 127 (H) 70 - 99 mg/dL    Comment: Glucose reference range applies only to samples taken after fasting for at least 8 hours.  Glucose, capillary     Status: Abnormal   Collection Time: 08/28/21 11:26 AM  Result Value Ref Range   Glucose-Capillary 216 (H) 70 - 99 mg/dL    Comment: Glucose reference range applies only to samples taken after fasting for at least 8 hours.    CT ABDOMEN PELVIS WO CONTRAST  Result Date: 08/27/2021 CLINICAL DATA:  51 year old female undergoing evaluation prior to potential LVAD placement. EXAM: CT ABDOMEN AND PELVIS WITHOUT CONTRAST TECHNIQUE: Multidetector CT imaging of the abdomen and pelvis was performed following the standard protocol without IV contrast. RADIATION DOSE REDUCTION: This exam was performed according to the departmental dose-optimization program which includes automated exposure control, adjustment of the mA and/or kV according to patient size and/or use of iterative reconstruction technique. COMPARISON:  No priors. FINDINGS: Lower chest: Patchy areas of ground-glass attenuation and mild interlobular septal thickening in the visualize lung bases, likely reflective of a background of mild interstitial pulmonary edema. Cardiomegaly with left atrial dilatation. Pacemaker/AICD lead terminating in the right ventricular apex. Hepatobiliary: No definite suspicious cystic or solid hepatic lesions are confidently identified on today's noncontrast CT examination. Amorphous intermediate to high attenuation material lying dependently in the gallbladder lumen, compatible with biliary  sludge. No findings to suggest an acute cholecystitis are noted at this time. Pancreas: No definite pancreatic mass or peripancreatic fluid collections or inflammatory changes are noted on today's noncontrast CT examination. Spleen: Unremarkable. Adrenals/Urinary Tract: 5 mm nonobstructive calculus in the lower pole collecting system of the right kidney. Unenhanced appearance of the left kidney and bilateral adrenal glands is otherwise unremarkable. No hydroureteronephrosis. Urinary bladder is normal in appearance. Stomach/Bowel: Unenhanced appearance of the stomach is normal. There is no pathologic dilatation of small bowel or colon. Normal appendix. Vascular/Lymphatic: No atherosclerotic calcifications are noted in the abdominal aorta or pelvic vasculature. No lymphadenopathy noted in the abdomen or pelvis. Reproductive: Uterus and right ovary are unremarkable in appearance. In the left ovary there is a large 11.7 x 4.3 x 7.4 cm low-attenuation (3 HU) mass (axial image 65 of series 3 and sagittal image 89 of series 7). Other: No abnormal structures identified in the left upper quadrant of the abdomen to preclude LVAD placement. No significant volume of ascites. No pneumoperitoneum. Musculoskeletal: There are no aggressive appearing lytic or blastic lesions noted in the visualized portions of the skeleton. IMPRESSION: 1. No unexpected findings to preclude LVAD placement. 2. Large cystic mass associated with the left ovary. Further evaluation with pelvic ultrasound is recommended at this time to exclude the possibility of ovarian malignancy. 3. Cardiomegaly. 4. Evidence of mild interstitial pulmonary edema in the lung bases, which  could suggest congestive heart failure. 5. Biliary sludge in the gallbladder. No findings to suggest an acute cholecystitis at this time. 6. 5 mm nonobstructive calculus in the lower pole collecting system of the right kidney. No ureteral stones or findings of urinary tract obstruction are  noted at this time. Electronically Signed   By: Vinnie Langton M.D.   On: 08/27/2021 05:15   DG Orthopantogram  Result Date: 08/27/2021 CLINICAL DATA:  Screening prior to possible cardiac surgery. No chest complaints. EXAM: ORTHOPANTOGRAM/PANORAMIC COMPARISON:  None. FINDINGS: Normal appearing teeth without caries or periapical lucency. IMPRESSION: 1. Normal. Electronically Signed   By: Titus Dubin M.D.   On: 08/27/2021 12:55   DG CHEST PORT 1 VIEW  Result Date: 08/26/2021 CLINICAL DATA:  Central line placement. EXAM: PORTABLE CHEST 1 VIEW COMPARISON:  Chest x-ray dated August 25, 2021. FINDINGS: New right upper extremity PICC line with tip in the right brachiocephalic vein. Unchanged left chest wall pacemaker. Stable cardiomegaly. Normal pulmonary vascularity. No focal consolidation, pleural effusion, or pneumothorax. No acute osseous abnormality. IMPRESSION: 1. New right upper extremity PICC line with tip in the right brachiocephalic vein. 2. No active disease. Electronically Signed   By: Titus Dubin M.D.   On: 08/26/2021 13:12   US THYROID  Result Date: 08/27/2021 CLINICAL DATA:  Hyperthyroidism. EXAM: THYROID ULTRASOUND TECHNIQUE: Ultrasound examination of the thyroid gland and adjacent soft tissues was performed. COMPARISON:  Chest CT 08/25/2021 FINDINGS: Parenchymal Echotexture: Moderately heterogenous Isthmus: 0.3 cm Right lobe: 5.2 x 2.3 x 2.1 cm Left lobe: Absent _________________________________________________________ Estimated total number of nodules >/= 1 cm: 0 Number of spongiform nodules >/=  2 cm not described below (TR1): 0 Number of mixed cystic and solid nodules >/= 1.5 cm not described below (TR2): 0 _________________________________________________________ Right thyroid lobe is moderately heterogeneous with at least 2 small nodules, largest measuring 0.5 cm. These nodules do not meet criteria for biopsy or dedicated follow-up. No definite thyroid tissue in the expected region of  the left thyroid lobe. 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 IMPRESSION: 1. 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 2. Thyroid tissue is heterogeneous with small nodules. These nodules do not meet criteria for biopsy or dedicated follow-up. The above is in keeping with the ACR TI-RADS recommendations - J Am Coll Radiol 2017;14:587-595. Electronically Signed   By: Markus Daft M.D.   On: 08/27/2021 08:18   US PELVIC COMPLETE W TRANSVAGINAL AND TORSION R/O  Result Date: 08/27/2021 CLINICAL DATA:  Ovarian mass. EXAM: TRANSABDOMINAL AND TRANSVAGINAL ULTRASOUND OF PELVIS DOPPLER ULTRASOUND OF OVARIES TECHNIQUE: Both transabdominal and transvaginal ultrasound examinations of the pelvis were performed. Transabdominal technique was performed for global imaging of the pelvis including uterus, ovaries, adnexal regions, and pelvic cul-de-sac. It was necessary to proceed with endovaginal exam following the transabdominal exam to visualize the ovaries. Color and duplex Doppler ultrasound was utilized to evaluate blood flow to the ovaries. COMPARISON:  August 26, 2021. FINDINGS: Uterus Measurements: 12.2 x 7.1 x 4.5 cm = volume: 203 mL. No fibroids or other mass visualized. Endometrium Thickness: 9 mm which is within normal limits. No focal abnormality visualized. Right ovary Measurements: 2.9 x 1.8 x 1.7 cm = volume: 4 mL. Normal appearance/no adnexal mass. Left ovary Measurements: 12.0 x 9.4 x 4.8 cm = volume: 284 mL. Large complex multi-septated cystic mass is seen essentially replacing the left ovary. Pulsed Doppler evaluation of both ovaries demonstrates normal low-resistance arterial and venous waveforms. Other  findings No abnormal free fluid. 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 MRI is recommended for further evaluation. Consultation with gynecologic surgery is recommended as  well. Electronically Signed   By: Marijo Conception M.D.   On: 08/27/2021 18:39   [name] EKG SITE RITE  Result Date: 08/26/2021 If Site Rite image not attached, placement could not be confirmed due to current cardiac rhythm.   Review of Systems  Constitutional:  Positive for activity change, appetite change and fatigue. Negative for chills and fever.  HENT: Negative.  Negative for dental problem.   Eyes: Negative.   Respiratory:  Positive for chest tightness and shortness of breath.   Cardiovascular:  Positive for palpitations. Negative for chest pain and leg swelling.  Gastrointestinal:  Positive for nausea and vomiting.  Endocrine: Negative.   Genitourinary: Negative.   Musculoskeletal: Negative.   Skin: Negative.   Allergic/Immunologic: Negative.   Neurological:  Negative for dizziness and syncope.  Hematological: Negative.   Psychiatric/Behavioral: Negative.    Blood pressure (!) 87/60, pulse (!) 115, temperature 97.7 F (36.5 C), temperature source Oral, resp. rate 19, weight 101.3 kg, SpO2 100 %. Physical Exam Constitutional:      Appearance: She is well-developed. She is obese.  HENT:     Head: Normocephalic and atraumatic.     Mouth/Throat:     Mouth: Mucous membranes are moist.     Pharynx: Oropharynx is clear.  Eyes:     Extraocular Movements: Extraocular movements intact.     Conjunctiva/sclera: Conjunctivae normal.     Pupils: Pupils are equal, round, and reactive to light.  Cardiovascular:     Rate and Rhythm: Normal rate and regular rhythm.     Pulses: Normal pulses.     Heart sounds: Normal heart sounds. No murmur heard. Pulmonary:     Effort: Pulmonary effort is normal.     Breath sounds: Normal breath sounds. No rales.  Abdominal:     General: Bowel sounds are normal. There is no distension.     Palpations: Abdomen is soft.     Tenderness: There is no abdominal tenderness.  Musculoskeletal:        General: No swelling or tenderness. Normal range of motion.      Cervical back: Normal range of motion and neck supple.  Skin:    General: Skin is warm and dry.  Neurological:     General: No focal deficit present.     Mental Status: She is alert and oriented to person, place, and time.  Psychiatric:        Mood and Affect: Mood normal.        Behavior: Behavior normal.   ECHOCARDIOGRAM REPORT         Patient Name:   RAYCHEL DOWLER Date of Exam: 08/26/2021  Medical Rec #:  [id number]    Height:       63.0 in  Accession #:    [id number]   Weight:       230.4 lb  Date of Birth:  30-Dec-1970    BSA:          2.053 m  Patient Age:    50 years     BP:           105/77 mmHg  Patient Gender: F            HR:           111 bpm.  Exam Location:  Inpatient   Procedure: 2D Echo, Cardiac Doppler, Color Doppler, 3D Echo and  Intracardiac             Opacification Agent   Indications:    CHF-Acute Systolic G25.63     History:        Patient has prior history of Echocardiogram examinations,  most                  recent 07/21/2021. Cardiomyopathy and CHF, Previous  Myocardial                  Infarction, Arrythmias:Tachycardia; Risk Factors:Sleep  Apnea and                  Hypertension.     Sonographer:    Bernadene Person RDCS  Referring Phys: [id number] [place]     1. Left ventricular ejection fraction, by estimation, is <20%. The left  ventricle has severely decreased function. The left ventricle demonstrates  global hypokinesis. The left ventricular internal cavity size was severely  dilated. No LV thrombus noted.  Indeterminate diastolic filling due to E-A fusion.   2. Right ventricular systolic function is mildly reduced. The right  ventricular size is normal. There is mildly elevated pulmonary artery  systolic pressure. The estimated right ventricular systolic pressure is  87.6 mmHg.   3. Left atrial size was severely dilated.   4. Right atrial size was mildly dilated.   5. The mitral valve is abnormal. Moderate-severe  mitral valve  regurgitation, ERO 0.22 cm^2 by PISA but looks worse visually. Suspect  functional MR. No evidence of mitral stenosis.   6. The aortic valve is tricuspid. Aortic valve regurgitation is not  visualized. No aortic stenosis is present.   7. The inferior vena cava is dilated in size with <50% respiratory  variability, suggesting right atrial pressure of 15 mmHg.   FINDINGS   Left Ventricle: Left ventricular ejection fraction, by estimation, is  <20%. The left ventricle has severely decreased function. The left  ventricle demonstrates global hypokinesis. Definity contrast agent was  given IV to delineate the left ventricular  endocardial borders. The left ventricular internal cavity size was  severely dilated. There is no left ventricular hypertrophy. Indeterminate  diastolic filling due to E-A fusion.   Right Ventricle: The right ventricular size is normal. No increase in  right ventricular wall thickness. Right ventricular systolic function is  mildly reduced. There is mildly elevated pulmonary artery systolic  pressure. The tricuspid regurgitant velocity   is 2.64 m/s, and with an assumed right atrial pressure of 15 mmHg, the  estimated right ventricular systolic pressure is 81.1 mmHg.   Left Atrium: Left atrial size was severely dilated.   Right Atrium: Right atrial size was mildly dilated.   Pericardium: There is no evidence of pericardial effusion.   Mitral Valve: The mitral valve is abnormal. Moderate to severe mitral  valve regurgitation. No evidence of mitral valve stenosis.   Tricuspid Valve: The tricuspid valve is normal in structure. Tricuspid  valve regurgitation is mild.   Aortic Valve: The aortic valve is tricuspid. Aortic valve regurgitation is  not visualized. No aortic stenosis is present.   Pulmonic Valve: The pulmonic valve was normal in structure. Pulmonic valve  regurgitation is trivial.   Aorta: The aortic root is normal in size and structure.    Venous: The inferior vena cava is dilated in size with less than 50%  respiratory variability, suggesting  right atrial pressure of 15 mmHg.   IAS/Shunts: No atrial level shunt detected by color flow Doppler.   Additional Comments: A device lead is visualized in the right ventricle.      LEFT VENTRICLE  PLAX 2D  LVIDd:         7.50 cm  LVIDs:         6.90 cm  LV PW:         0.80 cm  LV IVS:        0.90 cm  LVOT diam:     2.20 cm      3D Volume EF:  LV SV:         32           3D EF:        19 %  LV SV Index:   15           LV EDV:       374 ml  LVOT Area:     3.80 cm     LV ESV:       303 ml                              LV SV:        71 ml     LV Volumes (MOD)  LV vol d, MOD A2C: 249.0 ml  LV vol d, MOD A4C: 237.0 ml  LV vol s, MOD A2C: 212.0 ml  LV vol s, MOD A4C: 216.0 ml  LV SV MOD A2C:     37.0 ml  LV SV MOD A4C:     237.0 ml  LV SV MOD BP:      23.9 ml   RIGHT VENTRICLE  RV S prime:     12.20 cm/s  TAPSE (M-mode): 2.4 cm   LEFT ATRIUM              Index        RIGHT ATRIUM           Index  LA diam:        5.50 cm  2.68 cm/m   RA Area:     18.00 cm  LA Vol (A2C):   127.0 ml 61.85 ml/m  RA Volume:   43.30 ml  21.09 ml/m  LA Vol (A4C):   137.0 ml 66.72 ml/m  LA Biplane Vol: 135.0 ml 65.74 ml/m   AORTIC VALVE  LVOT Vmax:   71.00 cm/s  LVOT Vmean:  45.450 cm/s  LVOT VTI:    0.083 m     AORTA  Ao Root diam: 2.80 cm  Ao Asc diam:  2.80 cm   MR Peak grad:    81.0 mmHg    TRICUSPID VALVE  MR Mean grad:    49.5 mmHg    TR Peak grad:   27.9 mmHg  MR Vmax:         450.00 cm/s  TR Vmax:        264.00 cm/s  MR Vmean:        325.5 cm/s  MR PISA:         2.65 cm     SHUNTS  MR PISA Eff ROA: 20 mm       Systemic VTI:  0.08 m  MR PISA Radius:  0.65 cm      Systemic Diam: 2.20 cm   Dalton McleanMD  Electronically signed by Franki Monte  Signature Date/Time: 08/26/2021/2:54:38 PM         Final     Physicians  Panel Physicians Referring Physician Case  Authorizing Physician  Larey Dresser, MD (Primary)     Procedures  CENTRAL LINE INSERTION  RIGHT HEART CATH   Conclusion  1. Elevated right and left heart filling pressures.  2. 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  3. Low cardiac output, marked difference between Fick and thermodilution.  4. PAPI low at 1.33.    Discussed with Dr. Cyndia Bent.  Hopefully can get Impella 5.5 in OR tomorrow.  In the meantime, will increase milrinone to 0.375 and push diuresis.     Procedural Details  Technical Details Procedure: Right Heart Cath  Indication: Cardiogenic shock   Procedural Details: The right neck was prepped, draped, and anesthetized with 1% lidocaine. Using the modified Seldinger technique and ultrasound guidance, an 8 French sheath was placed in the right internal jugular vein. A Swan-Ganz catheter was used for the right heart catheterization. Standard protocol was followed for recording of right heart pressures and sampling of oxygen saturations. Fick and thermodilution cardiac output was calculated. There were no immediate procedural complications. The patient was transferred to the post catheterization recovery area for further monitoring.     Estimated blood loss <50 mL.   During this procedure medications were administered to achieve and maintain moderate conscious sedation while the patient's heart rate, blood pressure, and oxygen saturation were continuously monitored and I was present face-to-face 100% of this time.   Medications (Filter: Administrations occurring from 1150 to 1311 on 08/28/21)  important  Continuous medications are totaled by the amount administered until 08/28/21 1311.   fentaNYL (SUBLIMAZE) injection (mcg) Total dose:  25 mcg Date/Time Rate/Dose/Volume Action   08/28/21 1209 25 mcg Given    midazolam (VERSED) injection (mg) Total dose:  1 mg Date/Time Rate/Dose/Volume Action   08/28/21 1209 1 mg Given    lidocaine (PF)  (XYLOCAINE) 1 % injection (mL) Total volume:  5 mL Date/Time Rate/Dose/Volume Action   08/28/21 1212 5 mL Given    Heparin (Porcine) in NaCl 1000-0.9 UT/500ML-% SOLN (mL) Total volume:  500 mL Date/Time Rate/Dose/Volume Action   08/28/21 1304 500 mL Given    (feeding supplement) PROSource Plus liquid 30 mL (mL) Total dose:  Cannot be calculated* Dosing weight:  101 *Administration dose not documented Date/Time Rate/Dose/Volume Action   08/28/21 1204 *Not included in total MAR Hold    0.9 %  sodium chloride infusion (mL) Total dose:  Cannot be calculated* Dosing weight:  105.7 *Administration dose not documented Date/Time Rate/Dose/Volume Action   08/28/21 1204 *Not included in total MAR Hold    acetaminophen (TYLENOL) tablet 650 mg (mg) Total dose:  Cannot be calculated* Dosing weight:  105.7 *Administration dose not documented Date/Time Rate/Dose/Volume Action   08/28/21 1204 *Not included in total MAR Hold    aspirin EC tablet 81 mg (mg) Total dose:  Cannot be calculated* *Administration dose not documented Date/Time Rate/Dose/Volume Action   08/28/21 1204 *Not included in total MAR Hold    Chlorhexidine Gluconate Cloth 2 % PADS 6 each (each) Total dose:  Cannot be calculated* Dosing weight:  105.7 *Administration dose not documented Date/Time Rate/Dose/Volume Action   08/28/21 1204 *Not included in total MAR Hold    dapagliflozin propanediol (FARXIGA) tablet 10 mg (mg) Total dose:  Cannot be calculated* *Administration dose not documented Date/Time Rate/Dose/Volume Action   08/28/21 1204 *Not included in total MAR Hold    digoxin (  LANOXIN) tablet 0.0625 mg (mg) Total dose:  Cannot be calculated* Dosing weight:  104.5 *Administration dose not documented Date/Time Rate/Dose/Volume Action   08/28/21 1204 *Not included in total MAR Hold    heparin injection 5,000 Units (Units) Total dose:  Cannot be calculated* Dosing weight:  105.7 *Administration dose not  documented Date/Time Rate/Dose/Volume Action   08/28/21 1204 *Not included in total MAR Hold    insulin aspart (novoLOG) injection 0-15 Units (Units) Total dose:  Cannot be calculated* Dosing weight:  101 *Administration dose not documented Date/Time Rate/Dose/Volume Action   08/28/21 1204 *Not included in total MAR Hold    insulin aspart (novoLOG) injection 0-5 Units (Units) Total dose:  Cannot be calculated* Dosing weight:  101 *Administration dose not documented Date/Time Rate/Dose/Volume Action   08/28/21 1204 *Not included in total MAR Hold    MEDLINE mouth rinse (mL) Total dose:  Cannot be calculated* Dosing weight:  105.7 *Administration dose not documented Date/Time Rate/Dose/Volume Action   08/28/21 1204 *Not included in total MAR Hold    methimazole (TAPAZOLE) tablet 10 mg (mg) Total dose:  Cannot be calculated* Dosing weight:  104.5 *Administration dose not documented Date/Time Rate/Dose/Volume Action   08/28/21 1204 *Not included in total MAR Hold    methimazole (TAPAZOLE) tablet 5 mg (mg) Total dose:  Cannot be calculated* Dosing weight:  104.5 *Administration dose not documented Date/Time Rate/Dose/Volume Action   08/28/21 1204 *Not included in total MAR Hold    milrinone (PRIMACOR) 20 MG/100 ML (0.2 mg/mL) infusion (mcg/kg/min) Total dose:  1.07 mg Dosing weight:  105.7 Date/Time Rate/Dose/Volume Action   08/28/21 1244 0.375 mcg/kg/min - 11.89 mL/hr Rate/Dose Change    multivitamin with minerals tablet 1 tablet (tablet) Total dose:  Cannot be calculated* Dosing weight:  101 *Administration dose not documented Date/Time Rate/Dose/Volume Action   08/28/21 1204 *Not included in total MAR Hold    ondansetron (ZOFRAN) injection 4 mg (mg) Total dose:  Cannot be calculated* Dosing weight:  105.7 *Administration dose not documented Date/Time Rate/Dose/Volume Action   08/28/21 1204 *Not included in total MAR Hold    ondansetron (ZOFRAN) injection 4 mg  (mg) Total dose:  Cannot be calculated* Dosing weight:  101.3 *Administration dose not documented Date/Time Rate/Dose/Volume Action   08/28/21 1204 *Not included in total MAR Hold    predniSONE (DELTASONE) tablet 20 mg (mg) Total dose:  Cannot be calculated* Dosing weight:  104.5 *Administration dose not documented Date/Time Rate/Dose/Volume Action   08/28/21 1204 *Not included in total MAR Hold    quiniDINE gluconate CR tablet 324 mg (mg) Total dose:  Cannot be calculated* Dosing weight:  104.5 *Administration dose not documented Date/Time Rate/Dose/Volume Action   08/28/21 1204 *Not included in total MAR Hold    rosuvastatin (CRESTOR) tablet 40 mg (mg) Total dose:  Cannot be calculated* Dosing weight:  105.7 *Administration dose not documented Date/Time Rate/Dose/Volume Action   08/28/21 1204 *Not included in total MAR Hold    sodium chloride flush (NS) 0.9 % injection 10-40 mL (mL) Total dose:  Cannot be calculated* Dosing weight:  104.5 *Administration dose not documented Date/Time Rate/Dose/Volume Action   08/28/21 1204 *Not included in total MAR Hold    sodium chloride flush (NS) 0.9 % injection 10-40 mL (mL) Total dose:  Cannot be calculated* Dosing weight:  104.5 *Administration dose not documented Date/Time Rate/Dose/Volume Action   08/28/21 1204 *Not included in total MAR Hold    sodium chloride flush (NS) 0.9 % injection 3 mL (mL) Total dose:  Cannot be  calculated* Dosing weight:  105.7 *Administration dose not documented Date/Time Rate/Dose/Volume Action   08/28/21 1204 *Not included in total MAR Hold    sodium chloride flush (NS) 0.9 % injection 3 mL (mL) Total dose:  Cannot be calculated* Dosing weight:  105.7 *Administration dose not documented Date/Time Rate/Dose/Volume Action   08/28/21 1204 *Not included in total MAR Hold    sodium chloride flush (NS) 0.9 % injection 3 mL (mL) Total dose:  Cannot be calculated* Dosing weight:  101 *Administration  dose not documented Date/Time Rate/Dose/Volume Action   08/28/21 1204 *Not included in total MAR Hold    spironolactone (ALDACTONE) tablet 25 mg (mg) Total dose:  Cannot be calculated* Dosing weight:  101 *Administration dose not documented Date/Time Rate/Dose/Volume Action   08/28/21 1204 *Not included in total MAR Hold    Sedation Time  Sedation Time Physician-1: 32 minutes 5 seconds Radiation/Fluoro  Fluoro time: 4.2 (min) DAP: 3361 (mGycm2) Cumulative Air Kerma: 44 (mGy) Complications  Complications documented before study signed (08/28/2021  5:73 PM)   No complications were associated with this study.  Documented by Scarlette Calico, RN - 08/28/2021  1:04 PM     Right Heart  Right Heart Pressures RHC Procedural Findings: Hemodynamics (mmHg) RA mean 15 RV 63/20 PA 64/44, mean 53 PCWP mean 37 PAPI 1.33   Oxygen saturations: PA 52% AO 100%  Cardiac Output (Fick) 3.97  Cardiac Index (Fick) 1.96  PVR 4 WU  Cardiac Output (thermo) 2.27 Cardiac Index (thermo) 1.12   Assessment/Plan:  This 51 year old woman has nonischemic cardiomyopathy with ejection fraction of 20 to 25% of less than 20% with global hypokinesis and a severely dilated LV cavity at 7.5 cm during diastole and 6.9 cm during systole.  Stroke-volume index is 15.  There is moderate to severe mitral regurgitation.  The aortic valve is trileaflet with no regurgitation or stenosis.  Right ventricular systolic function is mildly reduced with normal RV size.  There is mild TR.  There is no sign of atrial level shunt.  She had NYHA class IV symptoms on admission with low Co-ox of 47% that increased to 56% on milrinone 0.25.  There is concern about increasing her milrinone any further due to her propensity for VT. Dr. [name] did right heart catheterization today which showed persistently elevated filling pressures and a low cardiac index of 1.6.  Her creatinine is 1.35 which has been about her baseline.  Pulmonary  function testing shows mild obstructive disease with an FEV1 of 1.72 which is 76% of predicted and FVC of 2.09 which is 74% of predicted.  Diffusion capacity was 93% of predicted.  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  Dr. [name] discussed this with OB/GYN and they did not feel the patient was a candidate for surgical treatment at this time and this could be followed.  She has definitely reached the point of needing advanced therapies for end-stage heart failure.  She is not felt to be a transplant candidate due to her BMI of 39-40.  I think she would be a reasonable candidate for left ventricular assist device therapy using a HeartMate 3.  Her right heart cath today did show low cardiac output with elevated right and left heart filling pressures and I agree with Dr. [name] that is probably best to insert a right subclavian  Impella 5.5 to allow further tune up prior to anticipated LVAD surgery.  I will try to do this tomorrow afternoon.  I discussed LVAD surgery with her, alternatives of continued medical therapy or palliative therapy, benefits and risks of surgery.  I also discussed insertion of an Impella 5.5 by the right subclavian artery as well as the benefits and risk of that including possible injury to the brachial plexus causing pain and/or weakness in the shoulder right arm, bleeding, vascular injury, and she understands and agrees to proceed.   Gaye Pollack 08/28/2021, 12:32 PM

## 2021-08-28 NOTE — Progress Notes (Signed)
BLE venous duplex and PRE-VAD exams have been completed. ? ?Results can be found under chart review under CV PROC. ?08/28/2021 2:33 PM ?Nakira Litzau RVT, RDMS ? ?

## 2021-08-29 ENCOUNTER — Encounter (HOSPITAL_COMMUNITY)
Admission: EM | Disposition: A | Discharge status: Rehabilitation Facility | Payer: Self-pay | Source: Home / Self Care | Attending: Cardiovascular Disease

## 2021-08-29 ENCOUNTER — Inpatient Hospital Stay (HOSPITAL_COMMUNITY): Payer: BC Managed Care – PPO

## 2021-08-29 ENCOUNTER — Encounter (HOSPITAL_COMMUNITY): Payer: Self-pay | Admitting: Cardiovascular Disease

## 2021-08-29 ENCOUNTER — Other Ambulatory Visit: Payer: Self-pay

## 2021-08-29 ENCOUNTER — Inpatient Hospital Stay (HOSPITAL_COMMUNITY): Payer: BC Managed Care – PPO | Admitting: Anesthesiology

## 2021-08-29 DIAGNOSIS — R570 Cardiogenic shock: Secondary | ICD-10-CM

## 2021-08-29 DIAGNOSIS — I428 Other cardiomyopathies: Secondary | ICD-10-CM | POA: Diagnosis not present

## 2021-08-29 DIAGNOSIS — I509 Heart failure, unspecified: Secondary | ICD-10-CM | POA: Diagnosis not present

## 2021-08-29 HISTORY — PX: PLACEMENT OF IMPELLA LEFT VENTRICULAR ASSIST DEVICE: SHX6519

## 2021-08-29 HISTORY — PX: TEE WITHOUT CARDIOVERSION: SHX5443

## 2021-08-29 LAB — CBC
HCT: 34.4 % — ABNORMAL LOW (ref 36.0–46.0)
Hemoglobin: 10.3 g/dL — ABNORMAL LOW (ref 12.0–15.0)
MCH: 22.2 pg — ABNORMAL LOW (ref 26.0–34.0)
MCHC: 29.9 g/dL — ABNORMAL LOW (ref 30.0–36.0)
MCV: 74 fL — ABNORMAL LOW (ref 80.0–100.0)
Platelets: 292 10*3/uL (ref 150–400)
RBC: 4.65 MIL/uL (ref 3.87–5.11)
RDW: 18.6 % — ABNORMAL HIGH (ref 11.5–15.5)
WBC: 19.3 10*3/uL — ABNORMAL HIGH (ref 4.0–10.5)
nRBC: 3.2 % — ABNORMAL HIGH (ref 0.0–0.2)

## 2021-08-29 LAB — GLUCOSE, CAPILLARY
Glucose-Capillary: 119 mg/dL — ABNORMAL HIGH (ref 70–99)
Glucose-Capillary: 114 mg/dL — ABNORMAL HIGH (ref 70–99)
Glucose-Capillary: 108 mg/dL — ABNORMAL HIGH (ref 70–99)

## 2021-08-29 LAB — BASIC METABOLIC PANEL WITH GFR
Anion gap: 14 (ref 5–15)
BUN: 21 mg/dL — ABNORMAL HIGH (ref 6–20)
CO2: 29 mmol/L (ref 22–32)
Calcium: 9.2 mg/dL (ref 8.9–10.3)
Chloride: 93 mmol/L — ABNORMAL LOW (ref 98–111)
Creatinine, Ser: 1.37 mg/dL — ABNORMAL HIGH (ref 0.44–1.00)
GFR, Estimated: 47 mL/min — ABNORMAL LOW
Glucose, Bld: 119 mg/dL — ABNORMAL HIGH (ref 70–99)
Potassium: 2.9 mmol/L — ABNORMAL LOW (ref 3.5–5.1)
Sodium: 136 mmol/L (ref 135–145)

## 2021-08-29 LAB — COOXEMETRY PANEL
Carboxyhemoglobin: 1.3 % (ref 0.5–1.5)
Methemoglobin: <0.7 % (ref 0.0–1.5)
O2 Saturation: 58 %
Total hemoglobin: 9.4 g/dL — ABNORMAL LOW (ref 12.0–16.0)

## 2021-08-29 LAB — POCT I-STAT 7, (LYTES, BLD GAS, ICA,H+H)
Acid-Base Excess: 9 mmol/L — ABNORMAL HIGH (ref 0.0–2.0)
Bicarbonate: 33.4 mmol/L — ABNORMAL HIGH (ref 20.0–28.0)
Calcium, Ion: 1.18 mmol/L (ref 1.15–1.40)
HCT: 34 % — ABNORMAL LOW (ref 36.0–46.0)
Hemoglobin: 11.6 g/dL — ABNORMAL LOW (ref 12.0–15.0)
O2 Saturation: 99 %
Patient temperature: 36.2
Potassium: 2.9 mmol/L — ABNORMAL LOW (ref 3.5–5.1)
Sodium: 135 mmol/L (ref 135–145)
TCO2: 35 mmol/L — ABNORMAL HIGH (ref 22–32)
pCO2 arterial: 42.6 mmHg (ref 32–48)
pH, Arterial: 7.5 — ABNORMAL HIGH (ref 7.35–7.45)
pO2, Arterial: 119 mmHg — ABNORMAL HIGH (ref 83–108)

## 2021-08-29 LAB — BASIC METABOLIC PANEL
Anion gap: 13 (ref 5–15)
BUN: 21 mg/dL — ABNORMAL HIGH (ref 6–20)
CO2: 25 mmol/L (ref 22–32)
Calcium: 9.1 mg/dL (ref 8.9–10.3)
Chloride: 96 mmol/L — ABNORMAL LOW (ref 98–111)
Creatinine, Ser: 1.31 mg/dL — ABNORMAL HIGH (ref 0.44–1.00)
GFR, Estimated: 50 mL/min — ABNORMAL LOW (ref >60)
Glucose, Bld: 112 mg/dL — ABNORMAL HIGH (ref 70–99)
Potassium: 3.5 mmol/L (ref 3.5–5.1)
Sodium: 134 mmol/L — ABNORMAL LOW (ref 135–145)

## 2021-08-29 LAB — MAGNESIUM
Magnesium: 2.3 mg/dL (ref 1.7–2.4)
Magnesium: 2.2 mg/dL (ref 1.7–2.4)

## 2021-08-29 LAB — DIGOXIN LEVEL: Digoxin Level: 0.2 ng/mL — ABNORMAL LOW (ref 0.8–2.0)

## 2021-08-29 LAB — CEA: CEA: 1.1 ng/mL (ref 0.0–4.7)

## 2021-08-29 LAB — LACTATE DEHYDROGENASE: LDH: 359 U/L — ABNORMAL HIGH (ref 98–192)

## 2021-08-29 LAB — POCT ACTIVATED CLOTTING TIME: Activated Clotting Time: 155 seconds

## 2021-08-29 LAB — CA 125: Cancer Antigen (CA) 125: 17.3 U/mL (ref 0.0–38.1)

## 2021-08-29 LAB — FIBRINOGEN: Fibrinogen: 433 mg/dL (ref 210–475)

## 2021-08-29 SURGERY — INSERTION, CARDIAC ASSIST DEVICE, IMPELLA
Anesthesia: General | Laterality: Right

## 2021-08-29 MED ORDER — POTASSIUM CHLORIDE CRYS ER 20 MEQ PO TBCR: 40.0000 meq | EXTENDED_RELEASE_TABLET | Freq: Once | ORAL | Status: DC

## 2021-08-29 MED ORDER — LACTATED RINGERS IV SOLN: INTRAVENOUS | Status: DC

## 2021-08-29 MED ORDER — HEPARIN SODIUM (PORCINE) 5000 UNIT/ML IJ SOLN: INTRAVENOUS | Status: DC

## 2021-08-29 MED ORDER — CHLORHEXIDINE GLUCONATE 0.12 % MT SOLN: 15.0000 mL | Freq: Once | OROMUCOSAL | Status: AC

## 2021-08-29 MED ORDER — ONDANSETRON HCL 4 MG/2ML IJ SOLN
INTRAMUSCULAR | Status: DC | PRN
  Administered 2021-08-29: 4 mg via INTRAVENOUS

## 2021-08-29 MED ORDER — ROCURONIUM BROMIDE 10 MG/ML (PF) SYRINGE
PREFILLED_SYRINGE | INTRAVENOUS | Status: DC | PRN
  Administered 2021-08-29: 70 mg via INTRAVENOUS

## 2021-08-29 MED ORDER — FENTANYL CITRATE (PF) 250 MCG/5ML IJ SOLN
INTRAMUSCULAR | Status: AC
  Filled 2021-08-29: qty 5

## 2021-08-29 MED ORDER — MIDAZOLAM HCL 2 MG/2ML IJ SOLN
INTRAMUSCULAR | Status: AC
  Filled 2021-08-29: qty 2

## 2021-08-29 MED ORDER — CEFAZOLIN SODIUM-DEXTROSE 2-4 GM/100ML-% IV SOLN
INTRAVENOUS | Status: AC
  Filled 2021-08-29: qty 100

## 2021-08-29 MED ORDER — POTASSIUM CHLORIDE 10 MEQ/50ML IV SOLN: 10.0000 meq | INTRAVENOUS | Status: DC

## 2021-08-29 MED ORDER — MIDAZOLAM HCL 5 MG/5ML IJ SOLN
INTRAMUSCULAR | Status: DC | PRN
  Administered 2021-08-29: 1 mg via INTRAVENOUS

## 2021-08-29 MED ORDER — POTASSIUM CHLORIDE 10 MEQ/50ML IV SOLN
10.0000 meq | INTRAVENOUS | Status: AC
  Administered 2021-08-29 (×4): 10 meq via INTRAVENOUS
  Filled 2021-08-29 (×4): qty 50

## 2021-08-29 MED ORDER — POTASSIUM CHLORIDE CRYS ER 20 MEQ PO TBCR
40.0000 meq | EXTENDED_RELEASE_TABLET | ORAL | Status: DC
  Filled 2021-08-29: qty 2

## 2021-08-29 MED ORDER — POTASSIUM CHLORIDE 10 MEQ/50ML IV SOLN
10.0000 meq | INTRAVENOUS | Status: AC
  Administered 2021-08-29 – 2021-08-30 (×4): 10 meq via INTRAVENOUS
  Filled 2021-08-29 (×6): qty 50

## 2021-08-29 MED ORDER — HEPARIN SODIUM (PORCINE) 5000 UNIT/ML IJ SOLN
INTRAVENOUS | Status: DC
  Administered 2021-09-01: 50 mL
  Filled 2021-08-29 (×2): qty 10

## 2021-08-29 MED ORDER — CEFAZOLIN SODIUM-DEXTROSE 2-3 GM-%(50ML) IV SOLR
INTRAVENOUS | Status: DC | PRN
  Administered 2021-08-29: 2 g via INTRAVENOUS

## 2021-08-29 MED ORDER — 0.9 % SODIUM CHLORIDE (POUR BTL) OPTIME
TOPICAL | Status: DC | PRN
  Administered 2021-08-29: 14:00:00 2000 mL

## 2021-08-29 MED ORDER — PROPOFOL 10 MG/ML IV BOLUS
INTRAVENOUS | Status: AC
  Filled 2021-08-29: qty 20

## 2021-08-29 MED ORDER — SODIUM CHLORIDE 0.9% IV SOLUTION: INTRAVENOUS | Status: DC | PRN

## 2021-08-29 MED ORDER — PROSOURCE PLUS PO LIQD
60.0000 mL | Freq: Three times a day (TID) | ORAL | Status: DC
  Administered 2021-08-29 – 2021-09-08 (×19): 60 mL via ORAL
  Administered 2021-09-08: 30 mL via ORAL
  Administered 2021-09-08 – 2021-09-10 (×3): 60 mL via ORAL
  Administered 2021-09-11: 30 mL via ORAL
  Filled 2021-08-29 (×27): qty 60

## 2021-08-29 MED ORDER — ETOMIDATE 2 MG/ML IV SOLN
INTRAVENOUS | Status: DC | PRN
  Administered 2021-08-29: 16 mg via INTRAVENOUS

## 2021-08-29 MED ORDER — CHLORHEXIDINE GLUCONATE 0.12 % MT SOLN
OROMUCOSAL | Status: AC
  Administered 2021-08-29: 13:00:00 15 mL via OROMUCOSAL
  Filled 2021-08-29: qty 15

## 2021-08-29 MED ORDER — FENTANYL CITRATE (PF) 100 MCG/2ML IJ SOLN
INTRAMUSCULAR | Status: DC | PRN
Start: 2021-08-29 — End: 2021-08-29
  Administered 2021-08-29: 50 ug via INTRAVENOUS
  Administered 2021-08-29: 100 ug via INTRAVENOUS

## 2021-08-29 MED ORDER — METOLAZONE 2.5 MG PO TABS
2.5000 mg | ORAL_TABLET | Freq: Once | ORAL | Status: AC
  Administered 2021-08-29: 10:00:00 2.5 mg via ORAL
  Filled 2021-08-29: qty 1

## 2021-08-29 MED ORDER — PHENYLEPHRINE HCL-NACL 20-0.9 MG/250ML-% IV SOLN
INTRAVENOUS | Status: DC | PRN
  Administered 2021-08-29: 10 ug/min via INTRAVENOUS

## 2021-08-29 MED ORDER — LIDOCAINE 2% (20 MG/ML) 5 ML SYRINGE
INTRAMUSCULAR | Status: DC | PRN
  Administered 2021-08-29: 40 mg via INTRAVENOUS

## 2021-08-29 MED ORDER — HEPARIN 6000 UNIT IRRIGATION SOLUTION
Status: DC | PRN
  Administered 2021-08-29: 1

## 2021-08-29 MED ORDER — HEPARIN 6000 UNIT IRRIGATION SOLUTION
Status: AC
  Filled 2021-08-29: qty 500

## 2021-08-29 MED ORDER — HEMOSTATIC AGENTS (NO CHARGE) OPTIME
TOPICAL | Status: DC | PRN
  Administered 2021-08-29 (×3): 1 via TOPICAL

## 2021-08-29 MED ORDER — ORAL CARE MOUTH RINSE: 15.0000 mL | Freq: Once | OROMUCOSAL | Status: AC

## 2021-08-29 MED ORDER — SUCCINYLCHOLINE CHLORIDE 200 MG/10ML IV SOSY
PREFILLED_SYRINGE | INTRAVENOUS | Status: DC | PRN
  Administered 2021-08-29: 160 mg via INTRAVENOUS

## 2021-08-29 MED ORDER — POTASSIUM CHLORIDE CRYS ER 20 MEQ PO TBCR
40.0000 meq | EXTENDED_RELEASE_TABLET | ORAL | Status: AC
  Administered 2021-08-29: 22:00:00 40 meq via ORAL

## 2021-08-29 MED ORDER — PHENYLEPHRINE 40 MCG/ML (10ML) SYRINGE FOR IV PUSH (FOR BLOOD PRESSURE SUPPORT)
PREFILLED_SYRINGE | INTRAVENOUS | Status: DC | PRN
  Administered 2021-08-29: 40 ug via INTRAVENOUS

## 2021-08-29 MED ORDER — HEPARIN SODIUM (PORCINE) 1000 UNIT/ML IJ SOLN
INTRAMUSCULAR | Status: DC | PRN
  Administered 2021-08-29: 12000 [IU] via INTRAVENOUS

## 2021-08-29 MED ORDER — PROTAMINE SULFATE 10 MG/ML IV SOLN
INTRAVENOUS | Status: DC | PRN
  Administered 2021-08-29 (×3): 15 mg via INTRAVENOUS
  Administered 2021-08-29: 5 mg via INTRAVENOUS

## 2021-08-29 SURGICAL SUPPLY — 60 items
ANCHOR CATH FOLEY SECURE (MISCELLANEOUS) ×3 IMPLANT
APL SRG 7X2 LUM MLBL SLNT (VASCULAR PRODUCTS)
APPLICATOR TIP COSEAL (VASCULAR PRODUCTS) IMPLANT
BLADE CLIPPER SURG (BLADE) ×2 IMPLANT
BLADE SURG 11 STRL SS (BLADE) ×1 IMPLANT
CATH ACCU-VU SIZ PIG 5F 100CM (CATHETERS) ×1 IMPLANT
CATH DIAG 6FR JR4 (CATHETERS) ×1 IMPLANT
CATH DIAG EXPO 6F AL1 (CATHETERS) ×3 IMPLANT
CATH INFINITI 6F MPB2 (CATHETERS) ×3 IMPLANT
CONTAINER PROTECT SURGISLUSH (MISCELLANEOUS) ×2 IMPLANT
DRAIN CHANNEL 15F RND FF W/TCR (WOUND CARE) ×1 IMPLANT
DRAPE C-ARM 42X72 X-RAY (DRAPES) ×5 IMPLANT
DRAPE CV SPLIT W-CLR ANES SCRN (DRAPES) ×3 IMPLANT
DRAPE PERI GROIN 82X75IN TIB (DRAPES) ×3 IMPLANT
DRAPE WARM FLUID 44X44 (DRAPES) ×3 IMPLANT
DRSG TEGADERM 4X4.5 CHG (GAUZE/BANDAGES/DRESSINGS) ×1 IMPLANT
EVACUATOR SILICONE 100CC (DRAIN) ×1 IMPLANT
GAUZE 4X4 16PLY ~~LOC~~+RFID DBL (SPONGE) ×3 IMPLANT
GAUZE SPONGE 4X4 12PLY STRL (GAUZE/BANDAGES/DRESSINGS) ×1 IMPLANT
GLOVE SURG ENC TEXT LTX SZ7.5 (GLOVE) ×6 IMPLANT
GLOVE SURG MICRO LTX SZ7 (GLOVE) ×3 IMPLANT
GOWN STRL REUS W/ TWL LRG LVL3 (GOWN DISPOSABLE) ×8 IMPLANT
GOWN STRL REUS W/TWL LRG LVL3 (GOWN DISPOSABLE) ×12
GRAFT HEMASHIELD 10MM (Graft) ×3 IMPLANT
GRAFT VASC STRG 30X10STRL (Graft) IMPLANT
HEMOSTAT SURGICEL 2X14 (HEMOSTASIS) ×2 IMPLANT
INSERT FOGARTY SM (MISCELLANEOUS) ×4 IMPLANT
KIT BASIN OR (CUSTOM PROCEDURE TRAY) ×3 IMPLANT
LOOP VESSEL MAXI BLUE (MISCELLANEOUS) ×3 IMPLANT
LOOP VESSEL MINI RED (MISCELLANEOUS) ×3 IMPLANT
NS IRRIG 1000ML POUR BTL (IV SOLUTION) ×12 IMPLANT
PACK CHEST (CUSTOM PROCEDURE TRAY) ×3 IMPLANT
PAD ARMBOARD 7.5X6 YLW CONV (MISCELLANEOUS) ×6 IMPLANT
PAD ELECT DEFIB RADIOL ZOLL (MISCELLANEOUS) ×3 IMPLANT
PUMP SET IMPELLA 5.5 US (CATHETERS) ×3 IMPLANT
SEALANT SURG COSEAL 8ML (VASCULAR PRODUCTS) ×3 IMPLANT
SPONGE T-LAP 18X18 ~~LOC~~+RFID (SPONGE) ×10 IMPLANT
SPONGE T-LAP 4X18 ~~LOC~~+RFID (SPONGE) ×3 IMPLANT
SUT ETHILON 3 0 PS 1 (SUTURE) ×4 IMPLANT
SUT PROLENE 5 0 C 1 36 (SUTURE) ×4 IMPLANT
SUT PROLENE 5 0 C1 (SUTURE) ×6 IMPLANT
SUT SILK  1 MH (SUTURE) ×6
SUT SILK 1 MH (SUTURE) ×8 IMPLANT
SUT SILK 1 TIES 10X30 (SUTURE) ×3 IMPLANT
SUT SILK 3 0 SH CR/8 (SUTURE) ×1 IMPLANT
SUT SILK 3 0 TIES 17X18 (SUTURE) ×3
SUT SILK 3-0 18XBRD TIE BLK (SUTURE) ×2 IMPLANT
SUT VIC AB 2-0 CT1 18 (SUTURE) ×4 IMPLANT
SUT VIC AB 2-0 CT1 27 (SUTURE) ×3
SUT VIC AB 2-0 CT1 TAPERPNT 27 (SUTURE) IMPLANT
SUT VIC AB 3-0 SH 27 (SUTURE) ×3
SUT VIC AB 3-0 SH 27X BRD (SUTURE) IMPLANT
SUT VIC AB 3-0 X1 27 (SUTURE) ×3 IMPLANT
SYR 30ML LL (SYRINGE) ×1 IMPLANT
TAPE CLOTH SURG 4X10 WHT LF (GAUZE/BANDAGES/DRESSINGS) ×1 IMPLANT
TOWEL GREEN STERILE (TOWEL DISPOSABLE) ×3 IMPLANT
TOWEL GREEN STERILE FF (TOWEL DISPOSABLE) ×3 IMPLANT
TRAY FOLEY SLVR 16FR TEMP STAT (SET/KITS/TRAYS/PACK) ×3 IMPLANT
WATER STERILE IRR 1000ML POUR (IV SOLUTION) ×6 IMPLANT
WIRE EMERALD 3MM-J .035X260CM (WIRE) ×1 IMPLANT

## 2021-08-29 NOTE — Progress Notes (Addendum)
Patient ID: [name], female   DOB: 25-Feb-1971, 51 y.o.   MRN: [id number]     Advanced Heart Failure Rounding Note  PCP-Cardiologist: [name], MD   Subjective:    3/6 Admitted w/ a/c systolic heart failure, NYHA Class IIIb-IV w/ low output, Started on empiric milrinone 0.25 + lasix gtt. Amiodarone gtt started for frequent NSVT. VAD W/u begun 3/6 Tx for hyperthyroidism. TSH undetectable. Free T4 5.11. Started on Methimazole + prednisone. Levothyroxine d/c  3/7 CT abdomen/pelvis showed left ovarian cystic mass, pelvic US showed complex cystic left ovarian mass.  Unable to get MRI with ICD.  3/8 RHC on 0.25 of Milrinone: RA 15, PCWP 37, PA Sat 57%, FICK CI 1.96, Thermo CI 1.12, PAPi 1.33, PVR 4. Milrinone increased to 0.375. Lasix gtt resumed   No events overnight. This morning remains on Milrinone 0.375 + lasix gtt at 12/hr.   2.8L in UOP yesterday. Wt only down 1 lb. CVP 8-9   Scr stable, 1.31  [place]wan #s CVP 9 PAP 50/40 CO 4.90 CI 2.41  Co-ox 58% PAPI 1.11   Sinus tach w/ frequent runs of NSVT  K 3.5 Mg 2.3   Feels slightly more SOB this morning. No other complaints.    RHC 3/8 (on Milrinone 0.25)   RA mean 15 RV 63/20 PA 64/44, mean 53 PCWP mean 37 PAPI 1.33   Oxygen saturations: PA 52% AO 100%  Cardiac Output (Fick) 3.97  Cardiac Index (Fick) 1.96  PVR 4 WU  Cardiac Output (thermo) 2.27 Cardiac Index (thermo) 1.12  1. Elevated right and left heart filling pressures.  2. 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  3. Low cardiac output, marked difference between Fick and thermodilution.  4. PAPI low at 1.33.   Objective:   Weight Range: 100.9 kg Body mass index is 39.4 kg/m.   Vital Signs:   Temp:  [97.2 F (36.2 C)-98.2 F (36.8 C)] 97.3 F (36.3 C) (03/09 0700) Pulse Rate:  [107-118] 112 (03/09 0700) Resp:  [11-27] 17 (03/09 0700) BP: (75-109)/(48-87) 95/74 (03/09 0700) SpO2:  [96 %-100 %] 97 % (03/09  0700) Weight:  [100.9 kg] 100.9 kg (03/09 0600)    Weight change: Filed Weights   08/27/21 0500 08/28/21 0500 08/29/21 0600  Weight: 101 kg 101.3 kg 100.9 kg    Intake/Output:   Intake/Output Summary (Last 24 hours) at 08/29/2021 0736 Last data filed at 08/29/2021 0600 Gross per 24 hour  Intake 1521.29 ml  Output 2750 ml  Net -1228.71 ml      Physical Exam    CVP 9  General:  fatigued appearing, moderately obese. No respiratory difficulty HEENT: normal Neck: supple. + rt IJ swan, JVD 9 cm. Carotids 2+ bilat; no bruits. No lymphadenopathy or thyromegaly appreciated. Cor: PMI nondisplaced. Regular rhythm tachy rate. No rubs, gallops or murmurs. Lungs: decreased BS at the bases + bibasilar crackles  Abdomen: obese, soft, nontender, nondistended. No hepatosplenomegaly. No bruits or masses. Good bowel sounds. Extremities: no cyanosis, clubbing, rash, trace b/l LE edema, distal extremities cool  Neuro: alert & oriented x 3, cranial nerves grossly intact. moves all 4 extremities w/o difficulty. Affect pleasant.    Telemetry   Sinus tachy, 110s, brief NSVT runs (personally reviewed)  EKG    No new EKG to review   Labs    CBC Recent Labs    08/28/21 0415 08/28/21 1234 08/29/21 0517  WBC 17.4*  --  19.3*  HGB 10.4* 11.6*   11.9* 10.3*  HCT 34.4* 34.0*   35.0* 34.4*  MCV 73.2*  --  74.0*  PLT 321  --  557   Basic Metabolic Panel Recent Labs    08/28/21 0415 08/28/21 1234 08/28/21 2152 08/29/21 0517  NA 132*   < > 134* 134*  K 3.6   < > 3.2* 3.5  CL 96*  --  97* 96*  CO2 23  --  24 25  GLUCOSE 292*  --  142* 112*  BUN 16  --  20 21*  CREATININE 1.35*  --  1.27* 1.31*  CALCIUM 8.7*  --  9.1 9.1  MG 2.3  --   --  2.3   < > = values in this interval not displayed.   Liver Function Tests No results for input(s): AST, ALT, ALKPHOS, BILITOT, PROT, ALBUMIN in the last 72 hours.  No results for input(s): LIPASE, AMYLASE in the last 72 hours. Cardiac Enzymes No  results for input(s): CKTOTAL, CKMB, CKMBINDEX, TROPONINI in the last 72 hours.  BNP: BNP (last 3 results) Recent Labs    07/18/21 1647 08/21/21 1208 08/25/21 1728  BNP 1,331.6* 957.7* 1,117.0*    ProBNP (last 3 results) No results for input(s): PROBNP in the last 8760 hours.   D-Dimer No results for input(s): DDIMER in the last 72 hours.  Hemoglobin A1C Recent Labs    08/27/21 0907  HGBA1C 5.1   Fasting Lipid Panel Recent Labs    08/26/21 1720  CHOL 100  HDL 26*  LDLCALC 55  TRIG 95  CHOLHDL 3.8   Thyroid Function Tests No results for input(s): TSH, T4TOTAL, T3FREE, THYROIDAB in the last 72 hours.  Invalid input(s): FREET3   Other results:   Imaging    CARDIAC CATHETERIZATION  Result Date: 08/28/2021 1. Elevated right and left heart filling pressures. 2. 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 3. Low cardiac output, marked difference between Fick and thermodilution. 4. PAPI low at 1.33. Discussed with Dr. Cyndia Bent.  Hopefully can get Impella 5.5 in OR tomorrow.  In the meantime, will increase milrinone to 0.375 and push diuresis.    VAS US DOPPLER PRE VAD  Result Date: 08/28/2021 PERIOPERATIVE VASCULAR EVALUATION Patient Name:  [name]  Date of Exam:   08/28/2021 Medical Rec #: [id number]     Accession #:    [id number] Date of Birth: Nov 29, 1970     Patient Gender: F Patient Age:   74 years Exam Location:  [facility] Procedure:      VAS US DOPPLER PRE VAD Referring Phys: [name] --------------------------------------------------------------------------------  Indications:      PRE-VAD. Risk Factors:     Hypertension, hyperlipidemia, no history of smoking, prior MI,                   prior CVA. Other Factors:    CHF, ICD, CKD3. Comparison Study: Previous carotid duplex on 07/23/20 was wnl Performing Technologist: Rogelia Rohrer RVT, RDMS  Examination Guidelines: A complete evaluation includes B-mode imaging, spectral Doppler, color  Doppler, and power Doppler as needed of all accessible portions of each vessel. Bilateral testing is considered an integral part of a complete examination. Limited examinations for reoccurring indications may be performed as noted.  Right Carotid Findings: +----------+--------+--------+--------+--------+------------------+             PSV cm/s EDV cm/s Stenosis Describe Comments            +----------+--------+--------+--------+--------+------------------+  CCA Prox   73  27                         intimal thickening  +----------+--------+--------+--------+--------+------------------+  CCA Distal 58       21                         intimal thickening  +----------+--------+--------+--------+--------+------------------+  ICA Prox   40       21                                             +----------+--------+--------+--------+--------+------------------+  ICA Distal 81       39                                             +----------+--------+--------+--------+--------+------------------+  ECA        60       13                                             +----------+--------+--------+--------+--------+------------------+ +----------+--------+-------+----------------+------------+             PSV cm/s EDV cms Describe         Arm Pressure  +----------+--------+-------+----------------+------------+  Subclavian 61       16      Multiphasic, WNL               +----------+--------+-------+----------------+------------+ +---------+--------+--+--------+--+---------+  Vertebral PSV cm/s 57 EDV cm/s 24 Antegrade  +---------+--------+--+--------+--+---------+ Left Carotid Findings: +----------+--------+--------+--------+--------+------------------+             PSV cm/s EDV cm/s Stenosis Describe Comments            +----------+--------+--------+--------+--------+------------------+  CCA Prox   91       34                         intimal thickening  +----------+--------+--------+--------+--------+------------------+  CCA  Distal 76       29                         intimal thickening  +----------+--------+--------+--------+--------+------------------+  ICA Prox   45       27                                             +----------+--------+--------+--------+--------+------------------+  ICA Distal 60       34                                             +----------+--------+--------+--------+--------+------------------+  ECA        58       14                                             +----------+--------+--------+--------+--------+------------------+ +----------+--------+--------+----------------+------------+  Subclavian PSV cm/s EDV cm/s Describe         Arm Pressure  +----------+--------+--------+----------------+------------+             63       11       Multiphasic, WNL 114           +----------+--------+--------+----------------+------------+ +---------+--------+--+--------+--+---------+  Vertebral PSV cm/s 52 EDV cm/s 26 Antegrade  +---------+--------+--+--------+--+---------+  ABI Findings: +--------+------------------+-----+---------+--------+  Right    Rt Pressure (mmHg) Index Waveform  Comment   +--------+------------------+-----+---------+--------+  Brachial                          triphasic PICC      +--------+------------------+-----+---------+--------+  PTA      88                 0.77  biphasic            +--------+------------------+-----+---------+--------+  DP       102                0.89  biphasic            +--------+------------------+-----+---------+--------+ +--------+------------------+-----+---------+-------+  Left     Lt Pressure (mmHg) Index Waveform  Comment  +--------+------------------+-----+---------+-------+  Brachial 114                      triphasic          +--------+------------------+-----+---------+-------+  PTA      107                0.94  biphasic           +--------+------------------+-----+---------+-------+  DP       102                0.89  biphasic            +--------+------------------+-----+---------+-------+ +-------+---------------+----------------+  ABI/TBI Today's ABI/TBI Previous ABI/TBI  +-------+---------------+----------------+  Right   0.89                              +-------+---------------+----------------+  Left    0.94                              +-------+---------------+----------------+  Summary: Right Carotid: The extracranial vessels were near-normal with only minimal wall                thickening or plaque. Left Carotid: The extracranial vessels were near-normal with only minimal wall               thickening or plaque. Vertebrals:  Bilateral vertebral arteries demonstrate antegrade flow. Subclavians: Normal flow hemodynamics were seen in bilateral subclavian              arteries.  *See table(s) above for measurements and observations. Right ABI: Resting right ankle-brachial index indicates mild right lower extremity arterial disease. Left ABI: Resting left ankle-brachial index indicates mild left lower extremity arterial disease.  Electronically signed by Monica Martinez MD on 08/28/2021 at 5:07:45 PM.    Final    VAS [name] LOWER EXTREMITY VENOUS (DVT)  Result Date: 08/28/2021  Lower Venous DVT Study Patient Name:  [name]  Date of Exam:   08/28/2021 Medical Rec #: [id number]     Accession #:    [id number] Date of Birth: 21-Feb-1971  Patient Gender: F Patient Age:   89 years Exam Location:  [facility] Procedure:      VAS [name] LOWER EXTREMITY VENOUS (DVT) Referring Phys: [name] --------------------------------------------------------------------------------  Indications: Pre-op.  Comparison Study: no previous exams Performing Technologist: Jody Hill RVT, RDMS  Examination Guidelines: A complete evaluation includes B-mode imaging, spectral Doppler, color Doppler, and power Doppler as needed of all accessible portions of each vessel. Bilateral testing is considered an integral part of a complete examination. Limited examinations  for reoccurring indications may be performed as noted. The reflux portion of the exam is performed with the patient in reverse Trendelenburg.  +---------+---------------+---------+-----------+----------+--------------+  RIGHT     Compressibility Phasicity Spontaneity Properties Thrombus Aging  +---------+---------------+---------+-----------+----------+--------------+  CFV       Full            Yes       Yes                                    +---------+---------------+---------+-----------+----------+--------------+  SFJ       Full                                                             +---------+---------------+---------+-----------+----------+--------------+  FV Prox   Full            Yes       Yes                                    +---------+---------------+---------+-----------+----------+--------------+  FV Mid    Full            Yes       Yes                                    +---------+---------------+---------+-----------+----------+--------------+  FV Distal Full            Yes       Yes                                    +---------+---------------+---------+-----------+----------+--------------+  PFV       Full                                                             +---------+---------------+---------+-----------+----------+--------------+  POP       Full            Yes       Yes                                    +---------+---------------+---------+-----------+----------+--------------+  PTV       Full                                                             +---------+---------------+---------+-----------+----------+--------------+  PERO      Full                                                             +---------+---------------+---------+-----------+----------+--------------+   +---------+---------------+---------+-----------+----------+--------------+  LEFT      Compressibility Phasicity Spontaneity Properties Thrombus Aging   +---------+---------------+---------+-----------+----------+--------------+  CFV       Full            Yes       Yes                                    +---------+---------------+---------+-----------+----------+--------------+  SFJ       Full                                                             +---------+---------------+---------+-----------+----------+--------------+  FV Prox   Full            Yes       Yes                                    +---------+---------------+---------+-----------+----------+--------------+  FV Mid    Full            Yes       Yes                                    +---------+---------------+---------+-----------+----------+--------------+  FV Distal Full            Yes       Yes                                    +---------+---------------+---------+-----------+----------+--------------+  PFV       Full                                                             +---------+---------------+---------+-----------+----------+--------------+  POP       Full            Yes       Yes                                    +---------+---------------+---------+-----------+----------+--------------+  PTV       Full                                                             +---------+---------------+---------+-----------+----------+--------------+  PERO      Full                                                             +---------+---------------+---------+-----------+----------+--------------+     Summary: BILATERAL: - No evidence of deep vein thrombosis seen in the lower extremities, bilaterally. - No evidence of superficial venous thrombosis in the lower extremities, bilaterally. -No evidence of popliteal cyst, bilaterally.   *See table(s) above for measurements and observations. Electronically signed by Monica Martinez MD on 08/28/2021 at 5:08:15 PM.    Final      Medications:     Scheduled Medications:  (feeding supplement) PROSource Plus  30 mL Oral TID BM   aspirin EC  81 mg  Oral Daily   Chlorhexidine Gluconate Cloth  6 each Topical Daily   dapagliflozin propanediol  10 mg Oral Daily   digoxin  0.0625 mg Oral Daily   insulin aspart  0-15 Units Subcutaneous TID WC   insulin aspart  0-5 Units Subcutaneous QHS   mouth rinse  15 mL Mouth Rinse BID   methimazole  10 mg Oral Daily   methimazole  5 mg Oral QPM   multivitamin with minerals  1 tablet Oral Daily   predniSONE  20 mg Oral Q breakfast   quiniDINE gluconate  324 mg Oral BID   rosuvastatin  40 mg Oral QHS   sodium chloride flush  10-40 mL Intracatheter Q12H   sodium chloride flush  3 mL Intravenous Q12H   sodium chloride flush  3 mL Intravenous Q12H   spironolactone  25 mg Oral Daily    Infusions:  sodium chloride     amiodarone 30 mg/hr (08/29/21 0600)   furosemide (LASIX) 200 mg in dextrose 5% 100 mL ('2mg'$ /mL) infusion 12 mg/hr (08/29/21 0600)   milrinone 0.375 mcg/kg/min (08/29/21 0600)    PRN Medications: sodium chloride, acetaminophen, ondansetron (ZOFRAN) IV, ondansetron (ZOFRAN) IV, sodium chloride flush, sodium chloride flush    Patient Profile   [name] is a 51 y.o. 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 Started on empiric milrinone and lasix gtt.   Assessment/Plan    1. Acute on chronic systolic HF: Nonischemic cardiomyopathy.  She has a [employer] ICD. Cause for cardiomyopathy is uncertain, initially identified peri-partum (after son's birth), so cannot rule out peri-partum CMP.  On and off, she has had frequent PVCs.   RHC in 8/15 showed normal filling pressures and preserved cardiac output. Echo in 4/22 with EF 25%, severe LV dilation.  CPX (8/22) with mild-moderate HF limitation.  Echo this admission with EF < 20% with severe dilation, normal RV  size with mildly decreased systolic function, moderate-severe functional MR with dilated IVC. She was admitted with volume overload, NYHA class IV symptoms and low output. HCO3 18 and creatinine 1.5 on admit, co-ox 47%. Milrinone 0.25 started + lasix gtt.  I also suspect hyperthyroidism is playing a role in her CHF exacerbation. RHC on 3/8 on 0.25 of Milrinone showed elevated filling pressures, moderate mixed pulmonary venous/pulmonary arterial hypertension and low output: RA 15,  PCWP 37, PA Sat 57%, FICK CI 1.96, Thermo CI 1.12, PAPi 1.33, PVR 4. Milrinone increased to 0.375. IV Lasix gtt resumed  - Today, Thermo CI 2.41 but PAPi low 1.11. Co-ox 58%. CVP 9. Scr stable.  - Continue Milrinone 0.375 - Plan OR today for Impella 5.5  - Worry about RV  - Continue Lasix gtt at 12/hr  [facility] for now with soft BP.  - Hold Coreg with marked volume overload and low output.     - Continue spironolactone 25 mg daily.   - Continue digoxin 0.0625 daily (lower dose).  Level was too high with digoxin 0.125 and concomitant quinidine in the past, level 0.2 today.  - Continue dapagliflozin 10 mg daily.  - ECG with LBBB, QRS 140 msec => with QRS < 150, Dr. [name] has not recommended CRT upgrade in the past.  - Patient has had recurrent CHF exacerbations and we have had to cut back on her outpatient meds. Low output HF this admission.  At this point, I think she needs advanced therapies (LVAD).  BMI too high currently for transplant (39.4), also with ovarian mass of uncertain etiology.  She is undergoing LVAD workup.  Plan for Impella 5.5 placement today  - She is a candidate for BAT, but concerned at this point we may be nearing LVAD.  2. Pulmonary HTN: Mixed picture with PVR 8.1 on RHC at [facility].  However, repeat study in 8/15 here showed no significant pulmonary arterial hypertension. 2017 RHC with pulmonary venous hypertension. RHC this admit showed moderate mixed pulmonary venous/pulmonary arterial  hypertension 3. Morbid obesity: BMI still appears out of range for heart transplant.  4. 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  She has been on Levoxyl at home but does not think she has taken for a month.  Now hyperthyroid.  This likely contributes to her CHF exacerbation. I asked [facility] to evaluate her for this. NSVT on milrinone makes it difficult for [name] to stop amiodarone.  - Started on prednisone 20 mg daily and methimazole bid.  - Will need outpatient endocrinology followup.  - As above, continuing amiodarone for now with NSVT.  5. PVCs: 11% PVCs + NSVT on 2/20 Zio patch.  Amiodarone started, but PVCs up to 13.3% with NSVT on 3/20 Zio patch. She saw Dr. [name] who thought that the ventricular arrhythmias were electromechanical due to worsening HF.  In 4/20, she had VT x 2 with ICD discharges in the setting of hypokalemia. Zio patch in 1/21 with PVCs down to 6.2% of beats. VT again in 2/22, quinidine started.  Initially w/ frequent NSVT on milrinone gtt. Started on amio gtt, less ectopy today but still has short NSVT runs.  - Continue amiodarone gtt. - Continue quinidine.  - Would like to get her off milrinone with Impella placement to limit NSVT.  - EP consulted given complicated situation with hyperthyroidism and NSVT. They favor continuing amiodarone and quinadine at this time. No additional AAD therapy  6. CVA: CVA in 10/21, had thrombectomy with stent placement.  No LV thrombus noted and no atrial fibrillation noted.  - Talked with interventional radiology. >1 year post-stent, ok to stop ticagrelor and continue ASA.  7. Fe deficiency: She reports heavy periods.  Transferrin saturation low.  - Feraheme given 3/6.  8. CKD stage 3: Creatinine 1.5 on admit, 1.31 today. Follow with milrinone and diuresis.  9. Mitral regurgitation: Moderate-severe functional MR.  Unlikely to be Mitraclip candidate with  severely dilated LV (>7 cm).  10. Left ovarian  mass: Cystic left ovarian mass noted on CT abdomen/pelvis, pelvic US with complex cystic left ovarian mass.  Unable to get MRI with ICD. Discussed with GYN-oncology (Dr. Delsa Sale).  Possible low grade malignancy, could also be benign.  No ascites noted on abdominal CT and no mention of abdominal spread.  She is not a surgical candidate at this time with low output HF.  - Plan at this point would be to stabilize with LVAD then investigate the ovarian mass.  - CEA and CA 125 tumor markers pending  11. Elevated WBCs: Suspect due to steroids, no evidence for infection.   Length of Stay: [phone] Ladoris Gene  08/29/2021, 7:36 AM  Advanced Heart Failure Team Pager [phone] (M-F; 7a - 5p)  Please contact [facility] for night-coverage after hours (5p -7a ) and weekends on [email or web address]  Patient seen with PA, agree with the above note.   CI up to 2.41 today and co-ox 58% on milrinone 0.375.  Still with episodes of NSVT.  Creatinine stable 1.2.  I/Os net negative 1229 on Lasix gtt 12 mg/hr.   She still feels subjectively short of breath.   General: NAD Neck: JVP 10 cm, no thyromegaly or thyroid nodule.  Lungs: Clear to auscultation bilaterally with normal respiratory effort. CV: Lateral PMI.  Heart tachy, regular S1/S2, no S3/S4, no murmur.  No peripheral edema.   Abdomen: Soft, nontender, no hepatosplenomegaly, no distention.  Skin: Intact without lesions or rashes.  Neurologic: Alert and oriented x 3.  Psych: Normal affect. Extremities: No clubbing or cyanosis.  HEENT: Normal.   Continue diuresis today with Lasix 12 mg/hr, did not get PCWP today but PA 50/40 so suspect elevated PCWP.   Will also give dose of metolazone 2.5 mg x 1 and replace K. Creatinine stable at 1.3.   PAPI 1.11, concern for RV function.  RV looked ok, however, on echo.  Will view on TEE at time of Impella.   Plan Impella 5.5 tentatively today to optimize hemodynamics (which are still marginal) for LVAD  hopefully next week.  Continue milrinone 0.375 for now, hope to wean down when on Impella to limit NSVT (still with short runs).   Continue amiodarone gtt for now.  Also continue hyperthyroidism treatment with prednisone and methimazole.   CRITICAL CARE Performed by: [name]  Total critical care time: 40 minutes  Critical care time was exclusive of separately billable procedures and treating other patients.  Critical care was necessary to treat or prevent imminent or life-threatening deterioration.  Critical care was time spent personally by me on the following activities: development of treatment plan with patient and/or surrogate as well as nursing, discussions with consultants, evaluation of patient's response to treatment, examination of patient, obtaining history from patient or surrogate, ordering and performing treatments and interventions, ordering and review of laboratory studies, ordering and review of radiographic studies, pulse oximetry and re-evaluation of patient's condition.  [name] 08/29/2021 8:26 AM

## 2021-08-29 NOTE — Progress Notes (Addendum)
Patient ID: [name], female   DOB: 1970-10-22, 51 y.o.   MRN: [id number] ? ?Impella 5.5 placed this evening.  I looked at the position this evening under echo.  Pump was deep, pulled back about 1 cm to good position. Heparin in purge.  ? ?I will decrease milrinone to 0.25 and will wean off phenylephrine.  CVP down to 4-5 after Impella placement, cut Lasix in half to 6 mg/hr.  ? ?[name] ?08/29/2021 ?7:00 PM ? ?

## 2021-08-29 NOTE — Progress Notes (Signed)
LVAD Initial Psychosocial Screening  Date/Time Initiated:  08-28-21 2:30 pm Referral Source:  Tanda Rockers, VAD Coordinator  Referral Reason: LVAD implantation  Source of Information:  Pt., husband and chart review  Demographics Name:  Siona Coulston Address:  [address] [place] [address] Home phone:  [phone]   Cell: [phone] Marital Status:  Married  Faith:  [employer] Primary Language: English   DOB:  07/03/1970  Medical & Follow-up Adherence to Medical regimen/INR checks: compliant  Medication adherence: compliant  Physician/Clinic Appointment Attendance: compliant   Advance Directives: Do you have a Living Will or Medical POA? No  Would you like to complete a Living Will and Medical POA prior to surgery?  Yes Do you have Goals of Care? Yes  Have you had a consult with the Palliative Care Team at [facility]? Yes  Psychological Health Appearance:  Unremarkable and In hospital gown Mental Status:  Alert, oriented Eye Contact:  Good Thought Content:  Coherent Speech:  Logical/coherent Mood:  Appropriate and Pleasant  Affect:  Appropriate to circumstance Insight:  Good Judgement: Unimpaired Interaction Style:  Engaged and Positive  Family/Social Information Who lives in your home? Name:   Relationship:   Jamie   Husband Dominic  Son 80 yo  Other family members/support persons in your life? Name:   Relationship:   Desiree  Daughter  Caregiving Needs Who is the primary caregiver? [place] status:  good Do you drive?  yes Do you work?  Yes (from home) Physical Limitations:  none Do you have other care giving responsibilities?  no Contact number: [phone]  Who is the secondary caregiver? [place] status:  good Do you drive?  yes Do you work?  yes Physical Limitations:  none Do you have other care giving responsibilities?  no Contact number: [phone]  Home Environment/Personal Care Do you have reliable phone service? Yes   Do you own or rent your home? own Number of steps into the home? 3 steps How many levels in the home? 2 levels (15 steps) Assistive devices in the home? no Electrical needs for LVAD (3 prong outlets)? yes Second hand smoke exposure in the home? no Travel distance from [facility]? 15 minutes   Community Are you active with community agencies/resources/homecare? No Agency Name:  n/a Are you active in a church, synagogue, mosque or other faith based community? No Faith based institutions name:  n/a What other sources do you have for spiritual support? Read the bible Are you active in any clubs or social organizations? no What do you do for fun?  Hobbies?  Interests? read  Education/Work Information What is the last grade of school you completed?  2 years college Preferred method of learning?  Written Do you have any problems with reading or writing?  No Are you currently employed?  No  When were you last employed? 2006  Name of employer? 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 10 years If you are not working, do you plan to return to work after VAD surgery? No Are you interested in job training or learning new skills?  No Did you serve in the [employer]? No  If so, what branch? Other  Financial Information What is your source of income? Social Security Disability Do you have difficulty meeting your monthly expenses? No Can you budget for the monthly cost for dressing supplies post procedure? Yes  Primary Health insurance:  BC/BS Secondary  Insurance: Medicare Prescription plan: BC/BS What are your prescription co-pays? varies Do you use mail order for your prescriptions?  No Have you ever had to refuse medication due to cost?  No Have you applied for Medicaid?  no Have you applied for Social Security Disability? current  Medical Information Briefly describe why you are here for evaluation: Started with Hf in  2000 after my son was born. 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 She states she had a defibrillator placed in 2006. Do you have a PCP or other medical provider? Patient, No Pcp Per (Inactive) Are you able to complete your ADL's? yes Do you have a history of trauma, physical, emotional, or sexual abuse? no Do you have any family history of heart problems? Yes, mom and grand mom Do you smoke now or past usage? never    Do you drink alcohol now or past usage? never      Are you currently using illegal drugs or misuse of medication or past usage? never  Have you ever been treated for substance abuse? No       Mental Health History How have you been feeling in the past year? Been feeling physically and emotionally declining due to declining health status Have you ever had any problems with depression, anxiety or other mental health issues? no Do you see a counselor, psychiatrist or therapist?   No saw a grief counselor in the past If you are currently experiencing problems are you interested in talking with a professional? No Have you or are you taking medications for anxiety/depression or any mental health concerns?  No  What are your coping strategies under stressful situations? She states not much stress in her life and has a very supportive family. Are there any other stressors in your life? no Have you had any past or current thoughts of suicide? no How many hours do you sleep at night? 6 hrs How is your appetite?not good Would you be interested in attending the LVAD support group? yes  PHQ2 Depression Scale:2  Legal Do you currently have any legal issues/problems? no  Have you had any legal issues/problems in the past? no    Plan for VAD Implementation Do you know and understand what happens during the VAD surgery? Patient Verbalizes Understanding  of surgery and able to describe details What do you know about the risks and side  effect associated with VAD surgery? Patient Verbalizes Understanding  of risks (infection, stroke and death) Explain what will happen right after surgery: Patient Verbalizes Understanding  of OR to ICU and will be intubated What is your plan for transportation for the first 8 weeks post-surgery? (Patients are not recommended to drive post-surgery for 8 weeks)  Driver:    [name] and Desiree Do you have airbags in your vehicle?  There is a risk of discharging the device if the airbag were to deploy. What do you know about your diet post-surgery? Patient Verbalizes Understanding  of Heart healthy How do you plan to monitor your medications, current and future?   Pill bottles How do you plan to complete ADL's post-surgery?  Assist from husband and daughter Will it be difficult to ask for help from your caregivers?  No  Please explain what you hope will be improved about your life as a result of receiving the LVAD? "Be mobile" Please tell me your biggest concern or fear about living with the LVAD?   dying Please explain your understanding  of how their body will change?  Patient verbalizes understanding Are you worried about these changes? no Do you see any barriers to your surgery or follow-up? No  Understanding of LVAD Patient states understanding of the following: Surgical procedures and risks, Electrical need for LVAD (3 prong outlets), Safety precautions with LVAD (water, etc.), LVAD daily self-care (dressing changes, computer check, extra supplies), Outpatient follow up (LVAD clinic appts, monitoring blood thinners), and Need for Emergency Planning  Discussed and Reviewed with Patient and Caregiver  Patient's current level of motivation to prepare for LVAD: Highly motivated for improved health Patient's present Level of Consent for LVAD: Ready   Education provided to patient/family/caregiver:   Caregiver role and responsibiltiy, Financial planning for LVAD, Role of Clinical Social Worker,  and Signs of Depression and Anxiety   Caregiver questions Please explain what you hope will be improved about your life and loved ones life as a result of receiving the LVAD?  Improved Mobility What is your biggest concern or fear about caregiving with an LVAD patient?  Dying What is your plan for availability to provide care 24/7 x2 weeks post op and dressing changes ongoing?  Take FMLA for 3-4 weeks Who is the relief/backup caregiver and what is their availability?  Desiree will also take any needed time off Preferred method of learning? Hands on  Do you drive? yes How do you handle stressful situations?  Deal with it ok Do you think you can do this? yes Is there anything that concerns about caregiving?  no Do you provide caregiving to anyone else? no   Caregivers current level of motivation to prepare for LVAD: Highly motivated Caregivers present level of consent for LVAD: Ready  Clinical Interventions Needed:    CSW will monitor signs and symptoms of depression and assist with adjustment to life with an LVAD.  CSW will refer patient for HPOA,  if not completed prior to surgery if still wishing to complete.  CSW encouraged attendance with the LVAD Support Group to assist further with adjustment and post implant peer support.   Clinical Impressions/Recommendations:   Patient is a 51 year old married female. She has two adult children and her son Dominic resides at home with her and her husband. Her daughter York Cerise resides close by and will be the back up caregiver. She reports she has been struggling with Hf for many years and has been on disability since 2006. She has insurance with BC/BS and Medicare as a secondary. She reads the bible for spiritual support and loves to read as her hobby. She denies any use of tobacco, alcohol or substance use. She scored a 2 on the PHQ-2 and denies any history of depression or mental health concerns. She hopes to be more mobile as her goal for  having the LVAD.  She stated she feels more hopeful and was very appreciative of meeting with a current patient. CSW would highly recommend Ms Zalenski for LVAD implant as she is highly motivated and has a very supportive family.   Melba Coon, [place]

## 2021-08-29 NOTE — Anesthesia Procedure Notes (Signed)
Procedure Name: Intubation ?Date/Time: 08/29/2021 2:52 PM ?Performed by: Georgia Duff, CRNA ?Pre-anesthesia Checklist: Patient identified, Emergency Drugs available, Suction available and Patient being monitored ?Patient Re-evaluated:Patient Re-evaluated prior to induction ?Oxygen Delivery Method: Circle System Utilized ?Preoxygenation: Pre-oxygenation with 100% oxygen ?Induction Type: IV induction ?Ventilation: Mask ventilation without difficulty ?Laryngoscope Size: Sabra Heck and 2 ?Grade View: Grade I ?Tube type: Oral ?Tube size: 7.0 mm ?Number of attempts: 1 ?Airway Equipment and Method: Stylet and Oral airway ?Placement Confirmation: ETT inserted through vocal cords under direct vision, positive ETCO2 and breath sounds checked- equal and bilateral ?Secured at: 21 cm ?Tube secured with: Tape ?Dental Injury: Teeth and Oropharynx as per pre-operative assessment  ? ? ? ? ?

## 2021-08-29 NOTE — Progress Notes (Signed)
Nutrition Follow-up ? ?DOCUMENTATION CODES:  ? ?Obesity unspecified, Non-severe (moderate) malnutrition in context of chronic illness ? ?INTERVENTION:  ? ?Increase ProSource Plus to 60 mL TID, each supplement provides 100 kcals and 15 grams protein. Total of 600 kcals, 90 g of protein. Pt to take with Lemonade per pt request ? ?Liberalize diet to Regular with Fluid Restriction ?-Encourage small, frequent meals due to early satiety ? ?Discussed protein options in detail that are appealing to patient: peanut butter, hummus, [place] deli meat, cheese, ground beef, salmon.  ? ?Continue MVI with Minerals daily ? ?Discussed nutrition poc in detail with pt; if unable to improve po intake, recommend insertion of Cortrak tube with initiation of  ? ?Plan to send plastic utensils on meal trays as pt complains of metallic taste ? ?Recommend adding bowel regimen as no BM since admission ? ? ?NUTRITION DIAGNOSIS:  ? ?Moderate Malnutrition related to chronic illness as evidenced by moderate muscle depletion, mild fat depletion, energy intake < 75% for > or equal to 1 month. ? ?Being addressed via supplements, liberalized diet ? ?GOAL:  ? ?Patient will meet greater than or equal to 90% of their needs ? ?Being addressed ? ?MONITOR:  ? ?PO intake, Supplement acceptance, Labs, Weight trends ? ?REASON FOR ASSESSMENT:  ? ?Consult ?LVAD Eval ? ?ASSESSMENT:  ? ?51 yo female admitted with acute on chronic CHF. PMH includes NICM EF 20-25%, CKD 3, CVA, HTN, HLD ? ?CT abdomen left ovarian mass ?Pelvic US: complex cystic left ovaria mass ? ?3/08 RHC: low cardiac output with elevated right/left filling pressure ? ?Pt walking around unit with RN on visit today. Pt looks better today but still not feeling great.  ? ?NPO today for Impella-missed breakfast and will miss lunch NPO yesterday for Breakfast and lunch. Recorded po intake of dinner last night 75%; pt reports she ate the meat sauce from the spaghetti. Pt has been tolerating fruit well;  reports she has been eating peanut butter by spoon. Pt reports she can only eats a few grapes for example before feeling full. Pt also reports nothing tastes good, metallic taste in mouth.  ? ?PO intake has been limited by poor appetite, altered taste (reports metallic taste in mouth) and early satiety but also frequent NPO status related to test/procedures thus far ? ?Pt has been taking Pro-Source packets with Lemonade and tolerating this well.  ? ?Pt again reports she cannot drink Ensure/Boost ? ?Reinforced importance of optimizing nutrition and increasing protein intake; pt agreeable to increasing Pro-Source ? ?Pt reports very limited mobility at baseline. She does cook and do some chores/errands. She does drive, does not work. Pt reports she knows her limits and tries not to over do it.  ? ?CA 125 17.3 (wdl), CEA 1.1 (wdl) ? ?Labs: sodium 134 (L) ?Meds: MVI with Minerals, ss novolog, lasix gtt, Kcl ? ? ?NUTRITION - FOCUSED PHYSICAL EXAM: ? ?Flowsheet Row Most Recent Value  ?Orbital Region Mild depletion  ?Upper Arm Region No depletion  ?Thoracic and Lumbar Region No depletion  ?Buccal Region Moderate depletion  ?Temple Region Moderate depletion  ?Clavicle Bone Region Moderate depletion  ?Clavicle and Acromion Bone Region Mild depletion  ?Scapular Bone Region Mild depletion  ?Dorsal Hand Mild depletion  ?Patellar Region Moderate depletion  ?Anterior Thigh Region Moderate depletion  ?Posterior Calf Region Moderate depletion  ?Edema (RD Assessment) Mild  ? ?  ? ? ?Diet Order:   ?Diet Order   ? ?       ?  Diet NPO time specified  Diet effective midnight       ?  ? ?  ?  ? ?  ? ? ?EDUCATION NEEDS:  ? ?Education needs have been addressed ? ?Skin:  Skin Assessment: Reviewed RN Assessment ? ?Last BM:  no documented BM ? ?Height:  ? ?Ht Readings from Last 1 Encounters:  ?08/29/21 '5\' 3"'$  (1.6 m)  ? ? ?Weight:  ? ?Wt Readings from Last 1 Encounters:  ?08/29/21 100.7 kg  ? ? ? ?BMI:  Body mass index is 39.33  kg/m?. ? ?Estimated Nutritional Needs:  ? ?Kcal:  1800-2000 kcals ? ?Protein:  100-115 g ? ?Fluid:  2L ? ? ?Kerman Passey MS, RDN, LDN, CNSC ?Registered Dietitian III ?Clinical Nutrition ?RD Pager and On-Call Pager Number Located in [place]  ? ?

## 2021-08-29 NOTE — Anesthesia Procedure Notes (Signed)
Arterial Line Insertion ?Start/End3/02/2022 3:05 PM, 08/29/2021 3:10 PM ?Performed by: Oleta Mouse, MD, anesthesiologist ? Left, radial was placed ?Catheter size: 20 G ? ?Attempts: 1 ?Procedure performed using ultrasound guided technique. ?Ultrasound Notes:anatomy identified, needle tip was noted to be adjacent to the nerve/plexus identified and no ultrasound evidence of intravascular and/or intraneural injection ?Following insertion, dressing applied and Biopatch. ?Post procedure assessment: normal ? ?Patient tolerated the procedure well with no immediate complications. ? ? ?

## 2021-08-29 NOTE — Progress Notes (Signed)
? ? ?  Progress Note from the Palliative Medicine Team at [facility] ? ? ?Patient Name: [name]        ?Date: 08/29/2021 ?DOB: [birth year]  Age: 51 y.o. MRN#: [id number] ?Attending Physician: Sanda Klein, MD ?Primary Care Physician: Patient, No Pcp Per (Inactive) ?Admit Date: 08/25/2021 ? ? ?Medical records reviewed  ? ?51 y.o. female   admitted on 08/25/2021 with  ?[name] is a 51 y.o. 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 ?  ?Evaluation in process for LVAD implantation. ? ?This NP visited patient at the bedside as a follow up for palliative medicine needs and emotional support. Patient is OOB to chair.  She is "feeling pretty good", anticipating LVAD tomorrow.   Son at bedside. ? ?Education offered on expectation for tomorrow, s/p surgery.  PMT will continue to support patient and her family.  Questions and concerns addressed   Emotional support offered.  ? ?Patient expresses readiness for surgery.  ? ?Total time spend on the unit was 50 minutes.  Greater than 50 % time spent in counseling and coordination of care.  ? ? ?Wadie Lessen NP  ?Palliative Medicine Team Team Phone # [phone] ?Pager [phone] ? Patient ID: [name], female   DOB: Jul 10, 1970, 51 y.o.   MRN: [id number] ? ?

## 2021-08-29 NOTE — Progress Notes (Signed)
Pt to unit from OR at 1815. Pt on 15L simple face mask, alert and oriented. Pt with impella 5.5 placed, echo done at bedside per Dr. [name] to verify placement. Impella adjusted to 41, on p7. Lasix gtt titrated to '6mg'$ /hr, milrinone titrated to 0.25 and neo titrated per Dr. [name]. Orders given to repeat CI in 4 hours and titrate milrinone to 0.125 if >2 and to titrate neo to off as able to keep MAPs >65. CVP was noted to be 6. PAP not reading correctly with swan at 48, Dr. Cyndia Bent at bedside. Chest xray ordered to assess placement. Pt placed on 3L [place], given ice chips. No complaints from pt at this time. Family at bedside, updated by physicians. [place] in reach. Report given to nightshift RN.  ?

## 2021-08-29 NOTE — Transfer of Care (Signed)
Immediate Anesthesia Transfer of Care Note ? ?Patient: [name] ? ?Procedure(s) Performed: PLACEMENT OF IMPELLA 5.5 LEFT VENTRICULAR ASSIST DEVICE (Right) ?TRANSESOPHAGEAL ECHOCARDIOGRAM (TEE) ? ?Patient Location: ICU ? ?Anesthesia Type:General ? ?Level of Consciousness: drowsy and patient cooperative ? ?Airway & Oxygen Therapy: Patient Spontanous Breathing and Patient connected to face mask oxygen ? ?Post-op Assessment: Report given to RN and Post -op Vital signs reviewed and stable ? ?Post vital signs: Reviewed and stable ? ?Last Vitals:  ?Vitals Value Taken Time  ?BP    ?Temp 36.5 ?C 08/29/21 1835  ?Pulse 107 08/29/21 1835  ?Resp 22 08/29/21 1835  ?SpO2 98 % 08/29/21 1835  ?Vitals shown include unvalidated device data. ? ?Last Pain:  ?Vitals:  ? 08/29/21 1315  ?TempSrc:   ?PainSc: 0-No pain  ?   ? ?  ? ?Complications: No notable events documented. ?

## 2021-08-29 NOTE — Op Note (Signed)
CARDIOVASCULAR SURGERY OPERATIVE NOTE ? ?12/17/2020 ? ?Surgeon:  Gaye Pollack, MD ? ?First Assistant: RNFA ? ? ?Preoperative Diagnosis:  Cardiogenic shock secondary to NICM and LVEF <20% ? ? ?Postoperative Diagnosis:  Same ? ? ?Procedure: ? ?Insertion of Impella 5.5 via right axillary artery. ? ?Anesthesia:  General Endotracheal ? ? ?Clinical History/Surgical Indication: ? ?This 51 year old woman has nonischemic cardiomyopathy with ejection fraction of 20 to 25% of less than 20% with global hypokinesis and a severely dilated LV cavity at 7.5 cm during diastole and 6.9 cm during systole.  Stroke-volume index is 15.  There is moderate to severe mitral regurgitation.  The aortic valve is trileaflet with no regurgitation or stenosis.  Right ventricular systolic function is mildly reduced with normal RV size.  There is mild TR.  There is no sign of atrial level shunt.  She had NYHA class IV symptoms on admission with low Co-ox of 47% that increased to 56% on milrinone 0.25.  There is concern about increasing her milrinone any further due to her propensity for VT. Dr. [name] did right heart catheterization today which showed persistently elevated filling pressures and a low cardiac index of 1.6.  Her creatinine is 1.35 which has been about her baseline. ?  ?Pulmonary function testing shows mild obstructive disease with an FEV1 of 1.72 which is 76% of predicted and FVC of 2.09 which is 74% of predicted.  Diffusion capacity was 93% of predicted.  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  Dr. [name] discussed this with OB/GYN and they did not feel the patient was a candidate for surgical treatment at this time and this could be followed. ?  ?She has definitely reached the point of needing advanced therapies for end-stage heart  failure.  She is not felt to be a transplant candidate due to her BMI of 39-40.  I think she would be a reasonable candidate for left ventricular assist device therapy using a HeartMate 3.  Her right heart cath today did show low cardiac output with elevated right and left heart filling pressures and I agree with Dr. [name] that is probably best to insert a right subclavian Impella 5.5 to allow further tune up prior to anticipated LVAD surgery.  I discussed insertion of an Impella 5.5 via the right subclavian artery as well as the benefits and risks of that including possible injury to the brachial plexus causing pain and/or weakness in the shoulder or right arm, bleeding, vascular injury, and she understands and agrees to proceed. ? ? ?Preparation: ? ?The patient was seen in the preop holding area.  The consent was signed by me. Preoperative antibiotics were given.  After being placed under general endotracheal anesthesia by the anesthesia team the neck and chest were prepped with betadine soap and solution and draped in the usual sterile manner. A surgical time-out was taken and the correct patient and operative procedure were confirmed with the nursing and anesthesia staff. ? ? ?Right axillary artery exposure and cannulation: ? ?A transverse incision was made below the right clavicle. The pectoralis major muscle was split along its fibers and the pectoralis minor muscle was retracted laterally. The brachial plexus was identified and gently retracted laterally to expose the axillary artery. The artery was controlled proximally and distally with vessel loops. The patient was heparinized and ACT maintained  greater than 250. The axillary artery was clamped proximally and distally with peripheral Debakey clamps. It was opened longitudinally.  A 10 mm Hemashield dacron graft was anastomosed in an end to side manner using continuous 5-0 prolene suture. CoSeal was applied for hemostasis and the clamp removed.   ? ?Insertion of Impella 5.5: ? ?The peel away sheath was inserted into the end of the graft and fastened with the sleeve connectors to provide hemostasis. A .035 J-wire was advanced through the sheath into the ascending aorta under fluoroscopic guidance usin C-arm. A pig tail catheter was inserted over the wire and then advanced across the aortic valve without difficulty to the LV apex. The J-wire was changed out for 0.18 wire and it was positioned with a gentle curve in the LV apex. The pigtail was removed. Then the graft was clamped adjacent to the anastomosis and the 5.5 Impella inserted through the sheath. Before full insertion it was burped by removing the clamp on the graft temporarily. The Impella was advanced fully into the graft and the clamp removed. The Impella was advanced into the LV and the wire removed. Position was confirmed with TEE with the tip 5 cm below the aortic valve. The graft was again clamped and the graft was cut flush with the skin. The sheath was inserted into the end of the graft and it was fastened with 1-0 silk ties for hemostasis. The sheath was attached to the skin with 0-silk sutures.  The patient appeared somewhat coagulopathic and had been on Brilinta so I decided to place a 15 Fr Blake drain in the depth of the wound below the pectoralis muscle layer. The pectoralis muscle was approximated with continuous 2-0 Vicryl suture. The subcutaneous tissue was approximated with continuous 3-0 Vicryl suture. The skin was approximated with continuous 3-0 Vicryl subcuticular suture.  ? ?All sponge, needle, and instrument counts were reported correct at the end of the case. Dry sterile dressings were placed over the incision. The Impella was anchored to the chest wall. The patient was then transported to the surgical intensive care unit in critical but stable condition.  ?

## 2021-08-29 NOTE — Progress Notes (Signed)
Gynecologic Oncology ? ?[name] is a 51 year old female who presented to the [facility] ER on 08/25/2021 for worsening SOB, fatigue, N/V, diarrhea, palpitations, and chest tightness. 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   ? ?On CT AP from 08/26/21 as part of evaluation for potential LVAD placement, the patient was noted to have a large left ovarian cystic mass measuring 11.7 x 4.3 x 7.4 cm. No lymphadenopathy noted in the abdomen and pelvis. No significant volume of ascites present. Pelvic/transvaginal US on 08/27/21 showed the 12 cm complex multi septated cystic left ovarian mass.  CA 125 obtained on 08/28/21 was 17.3 and CEA at 1.1.  ? ?Imaging reviewed by Dr. Delsa Sale. 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 Tumor markers within normal range. She is not a surgical candidate at this time with the recommendation to proceed with the LVAD procedure as planned. We will plan to see the patient in the office as an outpatient.   ? ? ?

## 2021-08-29 NOTE — Anesthesia Postprocedure Evaluation (Signed)
Anesthesia Post Note ? ?Patient: JULIEANN DRUMMONDS ? ?Procedure(s) Performed: PLACEMENT OF IMPELLA 5.5 LEFT VENTRICULAR ASSIST DEVICE (Right) ?TRANSESOPHAGEAL ECHOCARDIOGRAM (TEE) ? ?  ? ?Patient location during evaluation: ICU ?Anesthesia Type: General ?Level of consciousness: awake ?Pain management: pain level controlled ?Vital Signs Assessment: post-procedure vital signs reviewed and stable ?Respiratory status: spontaneous breathing, nonlabored ventilation, respiratory function stable and patient connected to nasal cannula oxygen ?Cardiovascular status: blood pressure returned to baseline and stable ?Postop Assessment: no apparent nausea or vomiting ?Anesthetic complications: no ? ? ?No notable events documented. ? ?Last Vitals:  ?Vitals:  ? 08/29/21 1254 08/29/21 1900  ?BP: 101/64   ?Pulse: (!) 117 (!) 105  ?Resp: 18 (!) 22  ?Temp: 36.4 ?C (!) 36.2 ?C  ?SpO2: 95% 98%  ?  ?Last Pain:  ?Vitals:  ? 08/29/21 1315  ?TempSrc:   ?PainSc: 0-No pain  ? ? ?  ?  ?  ?  ?  ?  ? ?Shuntia Exton P Mignonne Afonso ? ? ? ? ?

## 2021-08-29 NOTE — Anesthesia Preprocedure Evaluation (Addendum)
Anesthesia Evaluation  ?Patient identified by MRN, date of birth, ID band ?Patient awake ? ? ? ?Reviewed: ?Allergy & Precautions, NPO status , Patient's Chart, lab work & pertinent test results ? ?History of Anesthesia Complications ?Negative for: history of anesthetic complications ? ?Airway ?Mallampati: III ? ?TM Distance: >3 FB ?Neck ROM: Full ? ? ? Dental ? ?(+) Teeth Intact, Dental Advisory Given ?  ?Pulmonary ?shortness of breath, sleep apnea ,  ?  ?breath sounds clear to auscultation ? ? ? ? ? ? Cardiovascular ?hypertension, (-) angina+ Past MI and +CHF  ?+ Cardiac Defibrillator ? ?Rhythm:Regular Rate:Tachycardia ? ??1. Left ventricular ejection fraction, by estimation, is <20%. The left  ?ventricle has severely decreased function. The left ventricle demonstrates  ?global hypokinesis. The left ventricular internal cavity size was severely  ?dilated. No LV thrombus noted.  ?Indeterminate diastolic filling due to E-A fusion.  ??2. Right ventricular systolic function is mildly reduced. The right  ?ventricular size is normal. There is mildly elevated pulmonary artery  ?systolic pressure. The estimated right ventricular systolic pressure is  ?10.2 mmHg.  ??3. Left atrial size was severely dilated.  ??4. Right atrial size was mildly dilated.  ??5. The mitral valve is abnormal. Moderate-severe mitral valve  ?regurgitation, ERO 0.22 cm^2 by PISA but looks worse visually. Suspect  ?functional MR. No evidence of mitral stenosis.  ??6. The aortic valve is tricuspid. Aortic valve regurgitation is not  ?visualized. No aortic stenosis is present.  ??7. The inferior vena cava is dilated in size with <50% respiratory  ?variability, suggesting right atrial pressure of 15 mmHg.  ? ?[employer] AICD ?  ?Neuro/Psych ? 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 GI/Hepatic ?negative GI ROS, Neg liver ROS,   ?Endo/Other  ?Hypothyroidism  ? Renal/GU ?CRFRenal diseaseLab Results ?     Component                 Value               Date                 ?     CREATININE               1.31 (H)            08/29/2021           ?  ? ?  ?Musculoskeletal ? ? Abdominal ?  ?Peds ? Hematology ? ?(+) Blood dyscrasia, anemia , Lab Results ?     Component                Value               Date                 ?     WBC                      19.3 (H)            08/29/2021           ?     HGB                      10.3 (L)            08/29/2021           ?     HCT  34.4 (L)            08/29/2021           ?     MCV                      74.0 (L)            08/29/2021           ?     PLT                      292                 08/29/2021           ?   ?Anesthesia Other Findings ? ? Reproductive/Obstetrics ? ?  ? ? ? ? ? ? ? ? ? ? ? ? ? ?  ?  ? ? ? ? ? ? ?Anesthesia Physical ?Anesthesia Plan ? ?ASA: 4 ? ?Anesthesia Plan: General  ? ?Post-op Pain Management: Minimal or no pain anticipated  ? ?Induction: Intravenous ? ?PONV Risk Score and Plan: 3 and Ondansetron and Dexamethasone ? ?Airway Management Planned: Oral ETT ? ?Additional Equipment: Arterial line and TEE ? ?Intra-op Plan:  ? ?Post-operative Plan: Possible Post-op intubation/ventilation ? ?Informed Consent: I have reviewed the patients History and Physical, chart, labs and discussed the procedure including the risks, benefits and alternatives for the proposed anesthesia with the patient or authorized representative who has indicated his/her understanding and acceptance.  ? ? ? ?Dental advisory given ? ?Plan Discussed with: Anesthesiologist and CRNA ? ?Anesthesia Plan Comments:   ? ? ? ? ? ? ?Anesthesia Quick Evaluation ? ?

## 2021-08-29 NOTE — Anesthesia Procedure Notes (Signed)
Arterial Line Insertion ?Start/End3/02/2022 2:00 PM, 08/29/2021 2:30 PM ?Performed by: Oleta Mouse, MD, Georgia Duff, CRNA, CRNA ? Lidocaine 1% used for infiltration ?Right, radial was placed ?Catheter size: 20 G ? ?Attempts: 2 ?Procedure performed using ultrasound guided technique. ?Ultrasound Notes:anatomy identified, needle tip was noted to be adjacent to the nerve/plexus identified and no ultrasound evidence of intravascular and/or intraneural injection ?Following insertion, dressing applied and Biopatch. ?Post procedure assessment: normal ? ?Patient tolerated the procedure well with no immediate complications. ? ? ?

## 2021-08-29 NOTE — Progress Notes (Signed)
Patient walked 400 ft in the 6 minute walk test.  ?

## 2021-08-29 NOTE — Interval H&P Note (Signed)
History and Physical Interval Note: ? ?08/29/2021 ?2:03 PM ? ?[name]  has presented today for surgery, with the diagnosis of CHF.  The various methods of treatment have been discussed with the patient and family. After consideration of risks, benefits and other options for treatment, the patient has consented to  Procedure(s): ?PLACEMENT OF IMPELLA 5.5 LEFT VENTRICULAR ASSIST DEVICE (Right) ?TRANSESOPHAGEAL ECHOCARDIOGRAM (TEE) (N/A) as a surgical intervention.  The patient's history has been reviewed, patient examined, no change in status, stable for surgery.  I have reviewed the patient's chart and labs.  Questions were answered to the patient's satisfaction.   ? ? ?Gaye Pollack ? ? ?

## 2021-08-30 ENCOUNTER — Encounter (HOSPITAL_COMMUNITY): Payer: Self-pay | Admitting: Surgery

## 2021-08-30 ENCOUNTER — Other Ambulatory Visit (HOSPITAL_COMMUNITY): Payer: Medicare Other

## 2021-08-30 ENCOUNTER — Inpatient Hospital Stay (HOSPITAL_COMMUNITY): Payer: BC Managed Care – PPO

## 2021-08-30 DIAGNOSIS — Z95828 Presence of other vascular implants and grafts: Secondary | ICD-10-CM | POA: Diagnosis not present

## 2021-08-30 DIAGNOSIS — I509 Heart failure, unspecified: Secondary | ICD-10-CM | POA: Diagnosis not present

## 2021-08-30 DIAGNOSIS — E440 Moderate protein-calorie malnutrition: Secondary | ICD-10-CM | POA: Insufficient documentation

## 2021-08-30 DIAGNOSIS — I428 Other cardiomyopathies: Secondary | ICD-10-CM | POA: Diagnosis not present

## 2021-08-30 DIAGNOSIS — R570 Cardiogenic shock: Secondary | ICD-10-CM | POA: Diagnosis not present

## 2021-08-30 LAB — BASIC METABOLIC PANEL
Anion gap: 12 (ref 5–15)
Anion gap: 10 (ref 5–15)
BUN: 22 mg/dL — ABNORMAL HIGH (ref 6–20)
BUN: 29 mg/dL — ABNORMAL HIGH (ref 6–20)
CO2: 28 mmol/L (ref 22–32)
CO2: 27 mmol/L (ref 22–32)
Calcium: 9 mg/dL (ref 8.9–10.3)
Calcium: 9.2 mg/dL (ref 8.9–10.3)
Chloride: 94 mmol/L — ABNORMAL LOW (ref 98–111)
Chloride: 97 mmol/L — ABNORMAL LOW (ref 98–111)
Creatinine, Ser: 1.37 mg/dL — ABNORMAL HIGH (ref 0.44–1.00)
Creatinine, Ser: 1.25 mg/dL — ABNORMAL HIGH (ref 0.44–1.00)
GFR, Estimated: 47 mL/min — ABNORMAL LOW (ref >60)
GFR, Estimated: 53 mL/min — ABNORMAL LOW (ref >60)
Glucose, Bld: 118 mg/dL — ABNORMAL HIGH (ref 70–99)
Glucose, Bld: 133 mg/dL — ABNORMAL HIGH (ref 70–99)
Potassium: 3.6 mmol/L (ref 3.5–5.1)
Potassium: 3.7 mmol/L (ref 3.5–5.1)
Sodium: 134 mmol/L — ABNORMAL LOW (ref 135–145)
Sodium: 134 mmol/L — ABNORMAL LOW (ref 135–145)

## 2021-08-30 LAB — POCT I-STAT 7, (LYTES, BLD GAS, ICA,H+H)
Acid-Base Excess: 5 mmol/L — ABNORMAL HIGH (ref 0.0–2.0)
Bicarbonate: 29.2 mmol/L — ABNORMAL HIGH (ref 20.0–28.0)
Calcium, Ion: 1.14 mmol/L — ABNORMAL LOW (ref 1.15–1.40)
HCT: 31 % — ABNORMAL LOW (ref 36.0–46.0)
Hemoglobin: 10.5 g/dL — ABNORMAL LOW (ref 12.0–15.0)
O2 Saturation: 99 %
Patient temperature: 36.1
Potassium: 3.3 mmol/L — ABNORMAL LOW (ref 3.5–5.1)
Sodium: 136 mmol/L (ref 135–145)
TCO2: 30 mmol/L (ref 22–32)
pCO2 arterial: 37.1 mmHg (ref 32–48)
pH, Arterial: 7.501 — ABNORMAL HIGH (ref 7.35–7.45)
pO2, Arterial: 110 mmHg — ABNORMAL HIGH (ref 83–108)

## 2021-08-30 LAB — GLUCOSE, CAPILLARY
Glucose-Capillary: 104 mg/dL — ABNORMAL HIGH (ref 70–99)
Glucose-Capillary: 125 mg/dL — ABNORMAL HIGH (ref 70–99)
Glucose-Capillary: 127 mg/dL — ABNORMAL HIGH (ref 70–99)
Glucose-Capillary: 127 mg/dL — ABNORMAL HIGH (ref 70–99)
Glucose-Capillary: 136 mg/dL — ABNORMAL HIGH (ref 70–99)

## 2021-08-30 LAB — CBC
HCT: 31.9 % — ABNORMAL LOW (ref 36.0–46.0)
Hemoglobin: 9.9 g/dL — ABNORMAL LOW (ref 12.0–15.0)
MCH: 22.9 pg — ABNORMAL LOW (ref 26.0–34.0)
MCHC: 31 g/dL (ref 30.0–36.0)
MCV: 73.8 fL — ABNORMAL LOW (ref 80.0–100.0)
Platelets: 244 10*3/uL (ref 150–400)
RBC: 4.32 MIL/uL (ref 3.87–5.11)
RDW: 18.7 % — ABNORMAL HIGH (ref 11.5–15.5)
WBC: 21 10*3/uL — ABNORMAL HIGH (ref 4.0–10.5)
nRBC: 3.2 % — ABNORMAL HIGH (ref 0.0–0.2)

## 2021-08-30 LAB — COOXEMETRY PANEL
Carboxyhemoglobin: 1.5 % (ref 0.5–1.5)
Carboxyhemoglobin: 1.5 % (ref 0.5–1.5)
Methemoglobin: <0.7 % (ref 0.0–1.5)
Methemoglobin: <0.7 % (ref 0.0–1.5)
O2 Saturation: 67.5 %
O2 Saturation: 65.6 %
Total hemoglobin: 9.9 g/dL — ABNORMAL LOW (ref 12.0–16.0)
Total hemoglobin: 10.1 g/dL — ABNORMAL LOW (ref 12.0–16.0)

## 2021-08-30 LAB — PHOSPHORUS: Phosphorus: 4.1 mg/dL (ref 2.5–4.6)

## 2021-08-30 LAB — MAGNESIUM
Magnesium: 2.4 mg/dL (ref 1.7–2.4)
Magnesium: 2.6 mg/dL — ABNORMAL HIGH (ref 1.7–2.4)

## 2021-08-30 LAB — HEPARIN LEVEL (UNFRACTIONATED)
Heparin Unfractionated: <0.1 IU/mL — ABNORMAL LOW (ref 0.30–0.70)
Heparin Unfractionated: <0.1 IU/mL — ABNORMAL LOW (ref 0.30–0.70)

## 2021-08-30 LAB — LACTATE DEHYDROGENASE: LDH: 367 U/L — ABNORMAL HIGH (ref 98–192)

## 2021-08-30 MED ORDER — MENTHOL 3 MG MT LOZG
1.0000 | LOZENGE | Freq: Once | OROMUCOSAL | Status: AC
  Administered 2021-08-30: 3 mg via ORAL
  Filled 2021-08-30: qty 9

## 2021-08-30 MED ORDER — SODIUM CHLORIDE 0.9 % IV SOLN: INTRAVENOUS | Status: DC | PRN

## 2021-08-30 MED ORDER — FUROSEMIDE 10 MG/ML IJ SOLN
6.0000 mg/h | INTRAVENOUS | Status: DC
  Administered 2021-08-30 – 2021-08-31 (×2): 6 mg/h via INTRAVENOUS
  Filled 2021-08-30 (×2): qty 20

## 2021-08-30 MED ORDER — BISACODYL 5 MG PO TBEC
5.0000 mg | DELAYED_RELEASE_TABLET | Freq: Once | ORAL | Status: AC
  Administered 2021-08-30: 5 mg via ORAL
  Filled 2021-08-30: qty 1

## 2021-08-30 MED ORDER — SODIUM CHLORIDE 0.9 % IV SOLN
510.0000 mg | Freq: Once | INTRAVENOUS | Status: AC
  Administered 2021-08-30: 510 mg via INTRAVENOUS
  Filled 2021-08-30: qty 17

## 2021-08-30 MED ORDER — PROSOURCE TF PO LIQD
45.0000 mL | Freq: Every day | ORAL | Status: DC
  Administered 2021-08-31 – 2021-09-02 (×3): 45 mL
  Filled 2021-08-30 (×4): qty 45

## 2021-08-30 MED ORDER — ALPRAZOLAM 0.5 MG PO TABS
1.0000 mg | ORAL_TABLET | Freq: Once | ORAL | Status: AC
  Administered 2021-08-30: 1 mg via ORAL
  Filled 2021-08-30: qty 2

## 2021-08-30 MED ORDER — POLYETHYLENE GLYCOL 3350 17 G PO PACK
17.0000 g | PACK | Freq: Every day | ORAL | Status: DC
  Administered 2021-08-30: 17 g via ORAL
  Filled 2021-08-30 (×2): qty 1

## 2021-08-30 MED ORDER — SENNOSIDES-DOCUSATE SODIUM 8.6-50 MG PO TABS
2.0000 | ORAL_TABLET | Freq: Two times a day (BID) | ORAL | Status: DC
  Administered 2021-08-30 – 2021-09-02 (×6): 2 via ORAL
  Filled 2021-08-30 (×8): qty 2

## 2021-08-30 MED ORDER — ALPRAZOLAM ER 1 MG PO TB24: 1.0000 mg | ORAL_TABLET | Freq: Every day | ORAL | Status: DC

## 2021-08-30 MED ORDER — POTASSIUM CHLORIDE CRYS ER 20 MEQ PO TBCR
60.0000 meq | EXTENDED_RELEASE_TABLET | Freq: Two times a day (BID) | ORAL | Status: DC
  Administered 2021-08-30 – 2021-08-31 (×4): 60 meq via ORAL
  Filled 2021-08-30 (×4): qty 3

## 2021-08-30 MED ORDER — VITAL 1.5 CAL PO LIQD
1000.0000 mL | ORAL | Status: DC
  Administered 2021-08-30 – 2021-09-02 (×4): 1000 mL

## 2021-08-30 MED ORDER — PROSOURCE TF PO LIQD: 45.0000 mL | Freq: Two times a day (BID) | ORAL | Status: DC

## 2021-08-30 MED ORDER — HEPARIN (PORCINE) 25000 UT/250ML-% IV SOLN
300.0000 [IU]/h | INTRAVENOUS | Status: DC
  Administered 2021-08-30: 100 [IU]/h via INTRAVENOUS
  Filled 2021-08-30 (×2): qty 250

## 2021-08-30 MED ORDER — POTASSIUM CHLORIDE 10 MEQ/50ML IV SOLN
10.0000 meq | INTRAVENOUS | Status: AC
  Administered 2021-08-30 (×4): 10 meq via INTRAVENOUS
  Filled 2021-08-30 (×2): qty 50

## 2021-08-30 NOTE — Progress Notes (Signed)
Cortrak Tube Team Note: ? ?Consult received to place a Cortrak feeding tube. Placement was unsuccessful and that pt did not want to proceed further with placement.  ? ? ? ? ?Hermina Barters RD, LDN ?Clinical Dietitian ?See AMiON for contact information.  ? ? ?

## 2021-08-30 NOTE — Progress Notes (Signed)
1 Day Post-Op Procedure(s) (LRB): ?PLACEMENT OF IMPELLA 5.5 LEFT VENTRICULAR ASSIST DEVICE (Right) ?TRANSESOPHAGEAL ECHOCARDIOGRAM (TEE) (N/A) ?Subjective: ?Feels ok. Up to chair. ? ?Objective: ?Vital signs in last 24 hours: ?Temp:  [96.8 ?F (36 ?C)-98.2 ?F (36.8 ?C)] 98.1 ?F (36.7 ?C) (03/10 1500) ?Pulse Rate:  [98-105] 102 (03/10 1500) ?Cardiac Rhythm: Sinus tachycardia (03/10 1500) ?Resp:  [9-37] 21 (03/10 1500) ?BP: (80-110)/(56-85) 85/65 (03/10 0300) ?SpO2:  [91 %-100 %] 97 % (03/10 1500) ?Arterial Line BP: (81-115)/(63-88) 89/74 (03/09 2330) ? ?Hemodynamic parameters for last 24 hours: ?PAP: (10-45)/(1-39) 43/36 ?CVP:  [0 mmHg-11 mmHg] 9 mmHg ?CO:  [3.7 L/min-5.3 L/min] 4 L/min ?CI:  [1.8 L/min/m2-2.6 L/min/m2] 2 L/min/m2 ? ?Intake/Output from previous day: ?03/09 0701 - 03/10 0700 ?In: 1113.4 [I.V.:611.6; IV Piggyback:340.6] ?Out: 5020 [Urine:5000; Drains:20] ?Intake/Output this shift: ?Total I/O ?In: 587.4 [I.V.:192.3; Other:148; NG/GT:20; IV Piggyback:227.1] ?Out: 55 [Urine:830; Drains:6] ? ?Physical exam: ? ?Impella site looks ok. No swelling. Minimal Blake drain output.  ? ?Lab Results: ?Recent Labs  ?  08/29/21 ?4196 08/29/21 ?2129 08/30/21 ?0252 08/30/21 ?2229  ?WBC 19.3*  --  21.0*  --   ?HGB 10.3*   < > 9.9* 10.5*  ?HCT 34.4*   < > 31.9* 31.0*  ?PLT 292  --  244  --   ? < > = values in this interval not displayed.  ? ?BMET:  ?Recent Labs  ?  08/29/21 ?1954 08/29/21 ?2129 08/30/21 ?0252 08/30/21 ?7989  ?NA 136   < > 134* 136  ?K 2.9*   < > 3.6 3.3*  ?CL 93*  --  94*  --   ?CO2 29  --  28  --   ?GLUCOSE 119*  --  118*  --   ?BUN 21*  --  22*  --   ?CREATININE 1.37*  --  1.37*  --   ?CALCIUM 9.2  --  9.0  --   ? < > = values in this interval not displayed.  ?  ?PT/INR: No results for input(s): LABPROT, INR in the last 72 hours. ?ABG ?   ?Component Value Date/Time  ? PHART 7.501 (H) 08/30/2021 0517  ? HCO3 29.2 (H) 08/30/2021 0517  ? TCO2 30 08/30/2021 0517  ? ACIDBASEDEF 1.0 08/25/2021 2250  ? O2SAT 99  08/30/2021 0517  ? ?CBG (last 3)  ?Recent Labs  ?  08/30/21 ?0743 08/30/21 ?1115 08/30/21 ?1624  ?GLUCAP 104* 127* 136*  ? ? ?Assessment/Plan: ?S/P Procedure(s) (LRB): ?PLACEMENT OF IMPELLA 5.5 LEFT VENTRICULAR ASSIST DEVICE (Right) ?TRANSESOPHAGEAL ECHOCARDIOGRAM (TEE) (N/A) ? ?Impella functioning well. Heparin started today. Can remove the [facility] drain tomorrow if output remains low. ? ? LOS: 4 days  ? ? ?Gaye Pollack ?08/30/2021 ? ? ?

## 2021-08-30 NOTE — Evaluation (Signed)
Physical Therapy Evaluation ?Patient Details ?Name: [name] ?MRN: [id number] ?DOB: 1970-09-26 ?Today's Date: 08/30/2021 ? ?History of Present Illness ? Pt adm 3/6 with acute on chronic heart failure exacerbation. Pt with impella placed  3/9. Possible LVAD pending. PMH - chf due to NICM, ICD, obesity, Lt CVA, pulmonary htn, ckd, HTN.  ?Clinical Impression ? Pt presents to PT with decr mobility due to decr strength, decr activity tolerance and decr balance. Pt had some meds due to uncomfortable cortrak placement and felt somewhat "woozy". Will follow pt to maximize mobility prior to likely LVAD next week. After LVAD will reassess status and goals. Pt very motivated to return to independence.    ?   ? ?Recommendations for follow up therapy are one component of a multi-disciplinary discharge planning process, led by the attending physician.  Recommendations may be updated based on patient status, additional functional criteria and insurance authorization. ? ?Follow Up Recommendations Other (comment) (To be determined after LVAD) ? ?  ?Assistance Recommended at Discharge Frequent or constant Supervision/Assistance  ?Patient can return home with the following ? A little help with walking and/or transfers;Help with stairs or ramp for entrance ? ?  ?Equipment Recommendations Rollator (4 wheels);Other (comment) (To be updated after likely VAD)  ?Recommendations for Other Services ?    ?  ?Functional Status Assessment Patient has had a recent decline in their functional status and demonstrates the ability to make significant improvements in function in a reasonable and predictable amount of time.  ? ?  ?Precautions / Restrictions Precautions ?Precautions: Fall;Other (comment) ?Precaution Comments: impella, swan  ? ?  ? ?Mobility ? Bed Mobility ?Overal bed mobility: Needs Assistance ?Bed Mobility: Supine to Sit ?  ?  ?Supine to sit: Min assist, +2 for safety/equipment (2nd and 3rd person for line management) ?  ?  ?General  bed mobility comments: Assist to elevate trunk into sitting ?  ? ?Transfers ?Overall transfer level: Needs assistance ?Equipment used: 2 person hand held assist ?Transfers: Sit to/from Stand, Bed to chair/wheelchair/BSC ?Sit to Stand: +2 physical assistance, Min assist, +2 safety/equipment ?  ?Step pivot transfers: +2 physical assistance, Min assist, +2 safety/equipment ?  ?  ?  ?General transfer comment: Assist to rise and for balance. Step pivot to chair with 2 min assist plus 3rd person for line management ?  ? ?Ambulation/Gait ?  ?  ?  ?  ?  ?  ?  ?General Gait Details: Not attempted due to lines and pt woozy ? ?Stairs ?  ?  ?  ?  ?  ? ?Wheelchair Mobility ?  ? ?Modified Rankin (Stroke Patients Only) ?  ? ?  ? ?Balance Overall balance assessment: Needs assistance ?Sitting-balance support: No upper extremity supported, Feet supported ?Sitting balance-Leahy Scale: Good ?  ?  ?Standing balance support: Single extremity supported, During functional activity ?Standing balance-Leahy Scale: Poor ?Standing balance comment: UE support ?  ?  ?  ?  ?  ?  ?  ?  ?  ?  ?  ?   ? ? ? ?Pertinent Vitals/Pain Pain Assessment ?Pain Assessment: No/denies pain  ? ? ?Home Living Family/patient expects to be discharged to:: Private residence ?Living Arrangements: Spouse/significant other ?Available Help at Discharge: Family;Available 24 hours/day ?Type of Home: House ?Home Access: Stairs to enter ?Entrance Stairs-Rails: None ?Entrance Stairs-Number of Steps: 3 ?Alternate Level Stairs-Number of Steps: flight ?Home Layout: Two level ?Home Equipment: None ?   ?  ?Prior Function Prior Level of Function : Independent/Modified  Independent ?  ?  ?  ?  ?  ?  ?Mobility Comments: No assistive device. Limited by sob ?  ?  ? ? ?Hand Dominance  ? Dominant Hand: Right ? ?  ?Extremity/Trunk Assessment  ? Upper Extremity Assessment ?Upper Extremity Assessment: Defer to OT evaluation ?  ? ?Lower Extremity Assessment ?Lower Extremity Assessment:  Generalized weakness ?  ? ?   ?Communication  ? Communication: No difficulties  ?Cognition Arousal/Alertness: Awake/alert ?Behavior During Therapy: [facility] for tasks assessed/performed ?Overall Cognitive Status: Within Functional Limits for tasks assessed ?  ?  ?  ?  ?  ?  ?  ?  ?  ?  ?  ?  ?  ?  ?  ?  ?  ?  ?  ? ?  ?General Comments General comments (skin integrity, edema, etc.): VSS on 2L O2 or RA (removed O2 during transfer with SpO2 staying stable) ? ?  ?Exercises    ? ?Assessment/Plan  ?  ?PT Assessment Patient needs continued PT services  ?PT Problem List Decreased strength;Decreased balance;Decreased activity tolerance;Decreased mobility;Cardiopulmonary status limiting activity;Obesity ? ?   ?  ?PT Treatment Interventions DME instruction;Gait training;Functional mobility training;Stair training;Therapeutic activities;Therapeutic exercise;Balance training;Patient/family education   ? ?PT Goals (Current goals can be found in the Care Plan section)  ?Acute Rehab PT Goals ?Patient Stated Goal: return home ?PT Goal Formulation: With patient ?Time For Goal Achievement: 09/13/21 ?Potential to Achieve Goals: Good ? ?  ?Frequency Min 3X/week ?  ? ? ?Co-evaluation   ?  ?  ?  ?  ? ? ?  ?AM-PAC PT "6 Clicks" Mobility  ?Outcome Measure Help needed turning from your back to your side while in a flat bed without using bedrails?: A Little ?Help needed moving from lying on your back to sitting on the side of a flat bed without using bedrails?: A Little ?Help needed moving to and from a bed to a chair (including a wheelchair)?: Total ?Help needed standing up from a chair using your arms (e.g., wheelchair or bedside chair)?: Total ?Help needed to walk in hospital room?: Total ?Help needed climbing 3-5 steps with a railing? : Total ?6 Click Score: 10 ? ?  ?End of Session Equipment Utilized During Treatment: Oxygen ?Activity Tolerance: Patient tolerated treatment well ?Patient left: in chair;with call Dano/phone within reach;with  nursing/sitter in room ?Nurse Communication: Mobility status (nurse assisted with transfer) ?PT Visit Diagnosis: Other abnormalities of gait and mobility (R26.89);Muscle weakness (generalized) (M62.81) ?  ? ?Time: 2956-2130 ?PT Time Calculation (min) (ACUTE ONLY): 25 min ? ? ?Charges:   PT Evaluation ?$PT Eval High Complexity: 1 High ?PT Treatments ?$Therapeutic Activity: 8-22 mins ?  ?   ? ? ?[facility] PT ?Acute Rehabilitation Services ?Pager [phone] ?Office [phone] ? ? ?Shary Decamp [facility] ?08/30/2021, 5:12 PM ? ?

## 2021-08-30 NOTE — Progress Notes (Signed)
eLink Physician-Brief Progress Note ?Patient Name: [name] ?DOB: 1971-03-24 ?MRN: [id number] ? ? ?Date of Service ? 08/30/2021  ?HPI/Events of Note ? Received request from bedside regarding need for arterial line.   ?eICU Interventions ? Bedside CCM team made aware and will insert arterial line when feasible.  ? ? ? ?Intervention Category ?Intermediate Interventions: Other: ? ?Elsie Lincoln ?08/30/2021, 10:22 PM ?

## 2021-08-30 NOTE — Procedures (Signed)
Arterial Catheter Insertion Procedure Note ? ?[name]  ?[id number]  ?[birth year] ? ?Date:08/30/21  ?Time:11:11 PM  ? ? ?Provider Performing: Mick Sell  ? ? ?Procedure: Insertion of Arterial Line [phone]) with US guidance (97989)  ? ?Indication(s) ?Blood pressure monitoring and/or need for frequent ABGs ? ?Consent ?Unable to obtain consent due to emergent nature of procedure. ? ?Anesthesia ?None ? ? ?Time Out ?Verified patient identification, verified procedure, site/side was marked, verified correct patient position, special equipment/implants available, medications/allergies/relevant history reviewed, required imaging and test results available. ? ? ?Sterile Technique ?Maximal sterile technique including full sterile barrier drape, hand hygiene, sterile gown, sterile gloves, mask, hair covering, sterile ultrasound probe cover (if used). ? ? ?Procedure Description ?Area of catheter insertion was cleaned with chlorhexidine and draped in sterile fashion. With real-time ultrasound guidance an arterial catheter was placed into the left radial artery.  Appropriate arterial tracings confirmed on monitor.   ? ? ?Complications/Tolerance ?None; patient tolerated the procedure well. ? ? ?EBL ?Minimal ? ? ?Specimen(s) ?None ? ?Miranda Harbor, PA-C ?[facility] ?08/30/2021, 11:11 PM ? ?Please see [email or web address] for pager details. ? ?From 7A-7P if no response, please call [phone]. ?After hours, please call ELink [phone]. ? ?

## 2021-08-30 NOTE — Progress Notes (Addendum)
VAD coordinator rounding note: ? ?Met with pt and husband [name] today.  ? ?Impella at P 7, Flow 4 L/min ?  ?Continues on milrinone 0.125 ?  ?Coox 68% No dyspnea. No pain at impella. Notes little appetite. ? ?Informed pt that her insurance does not require a PA for VAD implant. Pt was informed that I spoke with Ana P from [place] and confirmed eligibility and benefits for VAD implant to be covered under hospitalization.  ? ?Pt states that she is feeling better this morning. Pt was informed that she needs to be using her IS 10x an hour. ? ?We will discuss pt at our [place] on Monday evening and if everyone is in agreement for VAD implant will proceed on Tuesday w/Dr Nils Pyle. Pt aware and in agreement.  ? ?All questions have been answered at this time and contact information was provided should they encounter any further questions. ? ?Tanda Rockers, RN ?VAD Coordinator  ? ?Office: [phone] ?24/7 VAD Pager: [phone] ? ?

## 2021-08-30 NOTE — Progress Notes (Signed)
Nutrition Follow-up ? ?DOCUMENTATION CODES:  ? ?Obesity unspecified, Non-severe (moderate) malnutrition in context of chronic illness ? ?INTERVENTION:  ? ?Initiate tube feeds via Cortrak: ?- Start Vital 1.5 @ 20 ml/hr and advance by 10 ml q 6 hours to goal rate of 50 ml/hr (1200 ml/day) ?- ProSource TF 45 ml daily ? ?Tube feeding regimen at goal rate provides 1840 kcal, 92 grams of protein, and 917 ml of H2O (meets 100% of kcal needs and 92% of protein needs). ? ?Monitor magnesium, potassium, and phosphorus BID for at least 3 days, MD to replete as needed, as pt is at risk for refeeding syndrome given malnutrition, inadequate PO intake for ~1 month. ? ?- Continue ProSource Plus po 60 ml TID, each supplement provides 100 kcal and 15 grams of protein (total: 600 kcal, 90 grams of protein) ? ?- Liberalize diet to Regular with 2000 ml fluid restriction ? ?- Encourage PO intake of smaller, more frequent meals due to satiety ? ?- Continue MVI with minerals daily ? ?NUTRITION DIAGNOSIS:  ? ?Moderate Malnutrition related to chronic illness as evidenced by moderate muscle depletion, mild fat depletion, energy intake < 75% for > or equal to 1 month. ? ?Ongoing, being addressed via oral nutrition supplements, liberalized diet, and tube feeds ? ?GOAL:  ? ?Patient will meet greater than or equal to 90% of their needs ? ?Met via TF ? ?MONITOR:  ? ?PO intake, Supplement acceptance, Labs, Weight trends ? ?REASON FOR ASSESSMENT:  ? ?Consult ?LVAD Eval ? ?ASSESSMENT:  ? ?51 yo female admitted with acute on chronic CHF. PMH includes NICM EF 20-25%, CKD 3, CVA, HTN, HLD. ? ?03/08 - s/p RHC showing low cardiac output with elevated R/L filling pressure ?03/09 - s/p Impella 5.5 placement ?03/10 - Cortrak placed (tip in distal stomach) ? ?Noted plan for LVAD placement on Tuesday 3/14 with Dr. Prescott Gum. ? ?Spoke with pt and husband at bedside. Explained need for Cortrak tube for supplemental nutrition to optimize pt for LVAD procedure and  for post-op healing. Discussed pt's current malnourished status and increased nutrient needs related to chronic and acute illnesses. Pt in agreement with proceeding with Cortrak tube placement. Pt also states that she is going to try to eat more and "force myself" to eat. Pt is taking ProSource Plus PO supplements at this time. ? ?Consult received for tube feeding initiation and management. Cortrak tube placed this afternoon, tip gastric per x-ray confirmation. RD to start tube feeds at trickle rate and advance q 6 hours to goal while monitoring for refeeding syndrome. RN aware of plan. ? ?Will liberalize diet back to previous diet of Regular with 2000 ml fluid restriction. ? ?Pt still without BM this admission. Miralax and senna ordered by PA this morning. ? ?Admit weight: 104.5 kg ?Current weight: 100.7 kg ? ?Medications reviewed and include: ProSource Plus 60 ml TID, farxiga, SSI, MVI with minerals, miralax, klor-con 60 mEq BID, prednisone, senna, spironolactone ? ?Drips: amiodarone, lasix, heparin, milrinone ? ?Labs reviewed: potassium 3.3, BUN 22, creatinine 1.37, WBC 21.0 ?CBG's: 104-127 x 24 hours ? ?UOP: 5000 ml x 24 hours ?Chest JP drain: 20 ml x 24 hours ?I/O's: -8.4 L since admit ? ?Diet Order:   ?Diet Order   ? ?       ?  Diet regular Room service appropriate? Yes; Fluid consistency: Thin; Fluid restriction: 2000 mL Fluid  Diet effective now       ?  ? ?  ?  ? ?  ? ? ?  EDUCATION NEEDS:  ? ?Education needs have been addressed ? ?Skin:  Skin Assessment: Reviewed RN Assessment (closed incision to chest) ? ?Last BM:  no documented BM ? ?Height:  ? ?Ht Readings from Last 1 Encounters:  ?08/29/21 _0  (1.6 m)  ? ? ?Weight:  ? ?Wt Readings from Last 1 Encounters:  ?08/29/21 100.7 kg  ? ? ?BMI:  Body mass index is 39.33 kg/m?. ? ?Estimated Nutritional Needs:  ? ?Kcal:  1800-2000 kcals ? ?Protein:  100-115 grams ? ?Fluid:  2L ? ? ? ?Gustavus Bryant, MS, RD, LDN ?Inpatient Clinical Dietitian ?Please see AMiON for  contact information. ? ?

## 2021-08-30 NOTE — Progress Notes (Signed)
CSW met with patient at bedside. Patient had just moved from bed to chair and was grateful to be out of the bed. She states she is feeling improved on the impella. She reported that she felt improvement almost immediately after leaving the procedure. "I can breathe better already". Patient appeared in good spirits and feeling positive about VAD implantation. Patient reports her husband was visiting earlier and plans to return this evening. She denies any other concerns at this time. CSW continues to follow for VAD implantation. Raquel Sarna, [place], [place] ? ?

## 2021-08-30 NOTE — Care Plan (Addendum)
MD Humphrey Rolls notified of unsuccesful attempts for arterial line insertions. MD Humphrey Rolls notified this RN to notify CCM for arterial line insertion. CCM notified and en route to insert arterial line, heparin paused per CCM. Will continue to monitor.  ?

## 2021-08-30 NOTE — Progress Notes (Addendum)
ANTICOAGULATION CONSULT NOTE - Initial Consult ? ?Pharmacy Consult for heparin ?Indication:  impella 5.5 ? ?No Known Allergies ? ?Patient Measurements: ?Height: '5\' 3"'$  (160 cm) ?Weight: 100.7 kg (222 lb) ?IBW/kg (Calculated) : 52.4 ?Heparin Dosing Weight: 76.1 kg ? ?Vital Signs: ?Temp: 97.3 ?F (36.3 ?C) (03/10 0600) ?Temp Source: Core (03/10 0600) ?BP: 85/65 (03/10 0300) ?Pulse Rate: 102 (03/10 0600) ? ?Labs: ?Recent Labs  ?  08/28/21 ?0415 08/28/21 ?1234 08/29/21 ?7948 08/29/21 ?1954 08/29/21 ?2129 08/30/21 ?0252 08/30/21 ?0165  ?HGB 10.4*   < > 10.3*  --  11.6* 9.9* 10.5*  ?HCT 34.4*   < > 34.4*  --  34.0* 31.9* 31.0*  ?PLT 321  --  292  --   --  244  --   ?CREATININE 1.35*   < > 1.31* 1.37*  --  1.37*  --   ? < > = values in this interval not displayed.  ? ? ?Estimated Creatinine Clearance: 55.6 mL/min (A) (by C-G formula based on SCr of 1.37 mg/dL (H)). ? ? ?Medical History: ?Past Medical History:  ?Diagnosis Date  ? Automatic implantable cardioverter-defibrillator in situ   ? Chronic CHF (congestive heart failure) (HCC)   ? a. EF 15-20% b. RHC (09/2013) RA 14, RV 57/22, PA 64/36 (48), PCWP 18, FIck CO/CI 3.7/1.6, PVR 8.1 WU, PA sat 47%   ? History of stomach ulcers   ? Hypertension   ? Hypotension   ? Hypothyroidism   ? Morbid obesity ([place])   ? Myocardial infarction [facility]) 08/2013  ? Nocturnal dyspnea   ? Nonischemic cardiomyopathy ([place])   ? Sinus tachycardia   ? Sleep apnea   ? Snoring-prob OSA 09/04/2011  ? [employer] ICD lead RECALL  [phone]   ? UARS (upper airway resistance syndrome) 09/04/2011  ? HST 12/2013:  AHI 4/hr (numerous episodes of airflow reduction that did not have concomitant desaturation)   ? ? ?Medications:  ?Infusions:  ? sodium chloride    ? amiodarone 30 mg/hr (08/30/21 0600)  ? heparin 50 units/mL (Impella PURGE) in dextrose 5 % 1000 mL bag    ? milrinone 0.125 mcg/kg/min (08/30/21 0600)  ? potassium chloride 10 mEq (08/30/21 0612)  ? ? ?Assessment: ?51 yo female s/p Impella 5.5 placement on  08/29/21. No PTA anticoagulation or indication for [facility] other than Impella.  ? ?Currently heparin running through purge only per MD as patient had some bleeding post-op. Hgb stable in the 10-11 and platelets stable at 244. LDH stable at ~360. Heparin level this morning undectable as expected.  ? ?Per MD can start adding systemic heparin slowly.  ? ?Heparin exposure from Impella 615 units/hr (12.3 ml/hr). Impella running at P7 proving 4.2 L/min of support.  ? ?Goal of Therapy:  ?Heparin level 0.2-0.5 units/ml ?Monitor platelets by anticoagulation protocol: Yes ?  ?Plan:  ?Start systemic heparin 100 units/hr ?Continue heparin through purge  ?HL at 5 pm (8 hours) ?Heparin level 5 am and 5 pm ?Continue to monitor H&H and platelets ? ?[name], PharmD ?PGY2 Cardiology Pharmacy Resident ?Phone: [phone] ?08/30/2021  6:40 AM ? ?Please check [email or web address] for unit-specific pharmacy phone numbers. ? ?[name] ?08/30/2021,6:35 AM ? ? ?

## 2021-08-30 NOTE — Progress Notes (Signed)
ANTICOAGULATION CONSULT NOTE ? ?Pharmacy Consult for heparin ?Indication:  impella 5.5 ? ?No Known Allergies ? ?Patient Measurements: ?Height: '5\' 3"'$  (160 cm) ?Weight: 100.7 kg (222 lb) ?IBW/kg (Calculated) : 52.4 ?Heparin Dosing Weight: 76.1 kg ? ?Vital Signs: ?Temp: 97.7 ?F (36.5 ?C) (03/10 1600) ?Temp Source: Core (03/10 1600) ?Pulse Rate: 94 (03/10 1700) ? ?Labs: ?Recent Labs  ?  08/28/21 ?0415 08/28/21 ?1234 08/29/21 ?6734 08/29/21 ?1954 08/29/21 ?2129 08/30/21 ?0252 08/30/21 ?1937 08/30/21 ?0645 08/30/21 ?1645  ?HGB 10.4*   < > 10.3*  --  11.6* 9.9* 10.5*  --   --   ?HCT 34.4*   < > 34.4*  --  34.0* 31.9* 31.0*  --   --   ?PLT 321  --  292  --   --  244  --   --   --   ?HEPARINUNFRC  --   --   --   --   --   --   --  <0.10* <0.10*  ?CREATININE 1.35*   < > 1.31* 1.37*  --  1.37*  --   --  1.25*  ? < > = values in this interval not displayed.  ? ? ? ?Estimated Creatinine Clearance: 60.9 mL/min (A) (by C-G formula based on SCr of 1.25 mg/dL (H)). ? ? ?Medical History: ?Past Medical History:  ?Diagnosis Date  ? Automatic implantable cardioverter-defibrillator in situ   ? Chronic CHF (congestive heart failure) (HCC)   ? a. EF 15-20% b. RHC (09/2013) RA 14, RV 57/22, PA 64/36 (48), PCWP 18, FIck CO/CI 3.7/1.6, PVR 8.1 WU, PA sat 47%   ? History of stomach ulcers   ? Hypertension   ? Hypotension   ? Hypothyroidism   ? Morbid obesity ([place])   ? Myocardial infarction [facility]) 08/2013  ? Nocturnal dyspnea   ? Nonischemic cardiomyopathy ([place])   ? Sinus tachycardia   ? Sleep apnea   ? Snoring-prob OSA 09/04/2011  ? [employer] ICD lead RECALL  [phone]   ? UARS (upper airway resistance syndrome) 09/04/2011  ? HST 12/2013:  AHI 4/hr (numerous episodes of airflow reduction that did not have concomitant desaturation)   ? ? ?Medications:  ?Infusions:  ? sodium chloride    ? amiodarone 30 mg/hr (08/30/21 1700)  ? feeding supplement (VITAL 1.5 CAL) 1,000 mL (08/30/21 1430)  ? furosemide (LASIX) 200 mg in dextrose 5% 100 mL ('2mg'$ /mL)  infusion 6 mg/hr (08/30/21 1700)  ? heparin 50 units/mL (Impella PURGE) in dextrose 5 % 1000 mL bag    ? heparin 100 Units/hr (08/30/21 1700)  ? milrinone 0.125 mcg/kg/min (08/30/21 1700)  ? ? ?Assessment: ?51 yo female s/p Impella 5.5 placement on 08/29/21. No PTA anticoagulation or indication for [facility] other than Impella.  ?-heparin purge at 13.6 ml/hr (680 units/hr of heparin) and systemic heparin is at 1000 units/hr ?-heparin level < 0.1 ? ? ? ?Goal of Therapy:  ?Heparin level 0.2-0.5 units/ml ?Monitor platelets by anticoagulation protocol: Yes ?  ?Plan:  ?-Increase systemic heparin to 200 units/hr ?-Heparin level q12h and CBC daily ? ?Hildred Laser, PharmD ?Clinical Pharmacist ?**Pharmacist phone directory can now be found on [email or web address] ([email or web address]).  Listed under [place]. ? ? ? ?

## 2021-08-30 NOTE — Progress Notes (Addendum)
Patient ID: [name], female   DOB: June 05, 1971, 51 y.o.   MRN: [id number]     Advanced Heart Failure Rounding Note  PCP-Cardiologist: [name], MD   Subjective:    3/6 Admitted w/ a/c systolic heart failure, NYHA Class IIIb-IV w/ low output, Started on empiric milrinone 0.25 + lasix gtt. Amiodarone gtt started for frequent NSVT. VAD W/u begun 3/6 Tx for hyperthyroidism. TSH undetectable. Free T4 5.11. Started on Methimazole + prednisone. Levothyroxine d/c  3/7 CT abdomen/pelvis showed left ovarian cystic mass, pelvic US showed complex cystic left ovarian mass.  Unable to get MRI with ICD.  3/8 RHC on 0.25 of Milrinone: RA 15, PCWP 37, PA Sat 57%, FICK CI 1.96, Thermo CI 1.12, PAPi 1.33, PVR 4. Milrinone increased to 0.375. Lasix gtt resumed  03/09 Impella 5.5 placed, milrinone decreased to 0.125, neo titrated off  Impella at P 7, Flow 4 L/min  Continues on milrinone 0.125  Coox 68%  Lasix gtt stopped last night. Brisk diuresis yesterday. No weight this am. Scr stable at 1.37   Swan #s CVP 11 PA 13/5 (10) CO 3.64 CI 1.79 PAPi 1.3 LDH 367  ? Reliability of SWAN #s  Fick CO 5.49/CI 2.59  No dyspnea. No pain at impella. Notes little appetite.    RHC 3/8 (on Milrinone 0.25)   RA mean 15 RV 63/20 PA 64/44, mean 53 PCWP mean 37 PAPI 1.33   Oxygen saturations: PA 52% AO 100%  Cardiac Output (Fick) 3.97  Cardiac Index (Fick) 1.96  PVR 4 WU  Cardiac Output (thermo) 2.27 Cardiac Index (thermo) 1.12  1. Elevated right and left heart filling pressures.  2. 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  3. Low cardiac output, marked difference between Fick and thermodilution.  4. PAPI low at 1.33.   Objective:   Weight Range: 100.7 kg Body mass index is 39.33 kg/m.   Vital Signs:   Temp:  [97 F (36.1 C)-97.7 F (36.5 C)] 97.3 F (36.3 C) (03/10 0600) Pulse Rate:  [99-117] 102 (03/10 0600) Resp:  [14-37] 21 (03/10 0600) BP:  (80-110)/(56-85) 85/65 (03/10 0300) SpO2:  [95 %-100 %] 99 % (03/10 0600) Arterial Line BP: (81-115)/(63-88) 89/74 (03/09 2330) Weight:  [100.7 kg] 100.7 kg (03/09 1254) Last BM Date :  (PTA)  Weight change: Filed Weights   08/28/21 0500 08/29/21 0600 08/29/21 1254  Weight: 101.3 kg 100.9 kg 100.7 kg    Intake/Output:   Intake/Output Summary (Last 24 hours) at 08/30/2021 5284 Last data filed at 08/30/2021 0600 Gross per 24 hour  Intake 1086.58 ml  Output 4945 ml  Net -3858.42 ml      Physical Exam    CVP 11 General:  Lying comfortably in bed, no distress HEENT: normal Neck: supple. + R IJ SWAN. Carotids 2+ bilat; no bruits.  Cor: PMI nondisplaced. Regular rhythm, tachy. No rubs, gallops or murmurs. R axillary impella. No hematoma Lungs: clear Abdomen: soft, nontender, nondistended, obese. No hepatosplenomegaly.  Extremities: no cyanosis, clubbing, rash, trace edema Neuro: alert & orientedx3, cranial nerves grossly intact. moves all 4 extremities w/o difficulty. Affect pleasant     Telemetry   Sinus tach 100s, ~ 5 PVCs/min  EKG    No new EKG to review   Labs    CBC Recent Labs    08/29/21 0517 08/29/21 2129 08/30/21 0252 08/30/21 0517  WBC 19.3*  --  21.0*  --   HGB 10.3*   < > 9.9* 10.5*  HCT 34.4*   < >  31.9* 31.0*  MCV 74.0*  --  73.8*  --   PLT 292  --  244  --    < > = values in this interval not displayed.   Basic Metabolic Panel Recent Labs    08/29/21 1954 08/29/21 2114 08/29/21 2129 08/30/21 0252 08/30/21 0517  NA 136  --    < > 134* 136  K 2.9*  --    < > 3.6 3.3*  CL 93*  --   --  94*  --   CO2 29  --   --  28  --   GLUCOSE 119*  --   --  118*  --   BUN 21*  --   --  22*  --   CREATININE 1.37*  --   --  1.37*  --   CALCIUM 9.2  --   --  9.0  --   MG  --  2.2  --  2.4  --    < > = values in this interval not displayed.   Liver Function Tests No results for input(s): AST, ALT, ALKPHOS, BILITOT, PROT, ALBUMIN in the last 72  hours.  No results for input(s): LIPASE, AMYLASE in the last 72 hours. Cardiac Enzymes No results for input(s): CKTOTAL, CKMB, CKMBINDEX, TROPONINI in the last 72 hours.  BNP: BNP (last 3 results) Recent Labs    07/18/21 1647 08/21/21 1208 08/25/21 1728  BNP 1,331.6* 957.7* 1,117.0*    ProBNP (last 3 results) No results for input(s): PROBNP in the last 8760 hours.   D-Dimer No results for input(s): DDIMER in the last 72 hours.  Hemoglobin A1C Recent Labs    08/27/21 0907  HGBA1C 5.1   Fasting Lipid Panel No results for input(s): CHOL, HDL, LDLCALC, TRIG, CHOLHDL, LDLDIRECT in the last 72 hours.  Thyroid Function Tests No results for input(s): TSH, T4TOTAL, T3FREE, THYROIDAB in the last 72 hours.  Invalid input(s): FREET3   Other results:   Imaging    DG CHEST PORT 1 VIEW  Result Date: 08/29/2021 CLINICAL DATA:  Evaluate LVAD placement EXAM: PORTABLE CHEST 1 VIEW COMPARISON:  08/26/2021 FINDINGS: Cardiac shadow is significantly enlarged but stable. Defibrillator is again noted and stable. Impella device is noted from the right subclavian approach extending into the left ventricle. Swan-Ganz catheter is noted in the pulmonary outflow tract. Right-sided PICC is noted in satisfactory position. Drainage catheter is noted over the upper right chest wall. No focal infiltrate or effusion is seen. No pneumothorax is noted. IMPRESSION: Tubes and lines as described above. Stable cardiomegaly. Electronically Signed   By: Inez Catalina M.D.   On: 08/29/2021 19:16   DG C-Arm 1-60 Min-No Report  Result Date: 08/29/2021 Fluoroscopy was utilized by the requesting physician.  No radiographic interpretation.     Medications:     Scheduled Medications:  (feeding supplement) PROSource Plus  60 mL Oral TID BM   aspirin EC  81 mg Oral Daily   Chlorhexidine Gluconate Cloth  6 each Topical Daily   dapagliflozin propanediol  10 mg Oral Daily   digoxin  0.0625 mg Oral Daily   insulin  aspart  0-15 Units Subcutaneous TID WC   insulin aspart  0-5 Units Subcutaneous QHS   mouth rinse  15 mL Mouth Rinse BID   methimazole  10 mg Oral Daily   methimazole  5 mg Oral QPM   multivitamin with minerals  1 tablet Oral Daily   predniSONE  20 mg Oral Q breakfast  quiniDINE gluconate  324 mg Oral BID   rosuvastatin  40 mg Oral QHS   sodium chloride flush  10-40 mL Intracatheter Q12H   sodium chloride flush  3 mL Intravenous Q12H   sodium chloride flush  3 mL Intravenous Q12H   spironolactone  25 mg Oral Daily    Infusions:  sodium chloride     amiodarone 30 mg/hr (08/30/21 0600)   heparin 50 units/mL (Impella PURGE) in dextrose 5 % 1000 mL bag     milrinone 0.125 mcg/kg/min (08/30/21 0600)   potassium chloride 10 mEq (08/30/21 0612)    PRN Medications: sodium chloride, sodium chloride, acetaminophen, ondansetron (ZOFRAN) IV, ondansetron (ZOFRAN) IV, sodium chloride flush, sodium chloride flush    Patient Profile   [name] is a 51 y.o. 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 Started on empiric milrinone and lasix gtt.   Assessment/Plan    1. Acute on chronic systolic HF: Nonischemic cardiomyopathy.  She has a [employer] ICD. Cause for cardiomyopathy is uncertain, initially identified peri-partum (after son's birth), so cannot rule out peri-partum CMP.  On and off, she has had frequent PVCs.   RHC in 8/15 showed normal filling pressures and preserved cardiac output. Echo in 4/22 with EF 25%, severe LV dilation.  CPX (8/22) with mild-moderate HF limitation.  Echo this admission with EF < 20% with severe dilation, normal RV size with mildly decreased systolic function, moderate-severe functional MR with dilated IVC. She was admitted with volume  overload, NYHA class IV symptoms and low output. HCO3 18 and creatinine 1.5 on admit, co-ox 47%. Milrinone 0.25 started + lasix gtt.  I also suspect hyperthyroidism is playing a role in her CHF exacerbation. RHC on 3/8 on 0.25 of Milrinone showed elevated filling pressures, moderate mixed pulmonary venous/pulmonary arterial hypertension and low output: RA 15, PCWP 37, PA Sat 57%, FICK CI 1.96, Thermo CI 1.12, PAPi 1.33, PVR 4. Milrinone increased to 0.375. IV Lasix gtt resumed  - S/p Impella 5.5 on 03/09. LDH stable. Heparin in purge. Continues on milrinone 0.125. Co-ox 68% this am. Thermo CO 3.64/CI 1.79. ? #s from [place]. Fick CO 5.49, CI 2.59.  - Impella at P 7, increase to P8. Continue milrinone at 0.125 - CVP 11. Restart lasix gtt at 6 mg/hr. Supp K aggressively today. - Worry about RV  - Hold Entresto for now. SBP 80s with narrow pulse pressure - Hold Coreg with marked volume overload and low output.     - Continue spironolactone 25 mg daily.   - Continue digoxin 0.0625 daily (lower dose).  Level was too high with digoxin 0.125 and concomitant quinidine in the past, level 0.2 today.  - Continue dapagliflozin 10 mg daily.  - ECG with LBBB, QRS 140 msec => with QRS < 150, Dr. [name] has not recommended CRT upgrade in the past.  - Patient has had recurrent CHF exacerbations and we have had to cut back on her outpatient meds. Low output HF this admission.  At this point, I think she needs advanced therapies (LVAD).  BMI too high currently for transplant (39.4), also with ovarian mass of uncertain etiology.  She is undergoing LVAD workup.  Underwent placement of Impella 5.5 to optimize for VAD on 03/09.  2. Pulmonary HTN:  Mixed picture with PVR 8.1 on RHC at [facility].  However, repeat study in 8/15 here showed no significant pulmonary arterial hypertension. 2017 RHC with pulmonary venous hypertension. 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 Morbid obesity: BMI  still appears out of range for heart transplant.  4. 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  She has been on Levoxyl at home but does not think she has taken for a month.  Now hyperthyroid.  This likely contributes to her CHF exacerbation. [facility] evaluated her for this. NSVT on milrinone makes it difficult for [name] to stop amiodarone.  - Started on prednisone 20 mg daily and methimazole bid.  - Will need outpatient endocrinology followup.  - As above, continuing amiodarone for now with NSVT.  5. PVCs: 11% PVCs + NSVT on 2/20 Zio patch.  Amiodarone started, but PVCs up to 13.3% with NSVT on 3/20 Zio patch. She saw Dr. [name] who thought that the ventricular arrhythmias were electromechanical due to worsening HF.  In 4/20, she had VT x 2 with ICD discharges in the setting of hypokalemia. Zio patch in 1/21 with PVCs down to 6.2% of beats. VT again in 2/22, quinidine started.  Initially w/ frequent NSVT on milrinone gtt. Started on amio gtt, less ectopy noted today. Milrinone down to 0.125 - Plan to stop amiodarone gtt once off milrinone - Continue quinidine.  - Would like to get her off milrinone with Impella placement to limit NSVT.  - EP consulted given complicated situation with hyperthyroidism and NSVT. They favor continuing amiodarone and quinadine at this time. No additional AAD therapy  6. CVA: CVA in 10/21, had thrombectomy with stent placement.  No LV thrombus noted and no atrial fibrillation noted.  - Talked with interventional radiology. >1 year post-stent, ok to stop ticagrelor and continue ASA.  7. Fe deficiency: She reports heavy periods.  Transferrin saturation low.  - Feraheme given 3/6, repeat today 8. CKD stage 3: Creatinine 1.5 on admit, 1.37 today. Follow with milrinone and diuresis.  9. Mitral regurgitation: Moderate-severe functional MR.  Unlikely to be Mitraclip candidate with severely dilated LV (>7 cm).  10. 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  Unable to get MRI with ICD. Seen by GYN. Felt to be most likely non-neoplastic. Tumor markers within normal range. Possible low grade malignancy, could also be benign.  No ascites noted on abdominal CT and no mention of abdominal spread.  She is not a surgical candidate at this time with low output HF.  - Plan at this point would be to stabilize with LVAD then investigate the ovarian mass.  - F/u with GYN as outpatient 11. Elevated WBCs: Suspect due to steroids, no evidence for infection.  12. Malnutrition: - Prealbumin 13 - Place CorTrak for tube feeds 13. Constipation: - Discussed bowel regimen with PharmD  Need to mobilize today  Length of Stay: 4  [facility], [place], PA-C  08/30/2021, 6:52 AM  Advanced Heart Failure Team Pager [phone] (M-F; 7a - 5p)  Please contact [facility] for night-coverage after hours (5p -7a ) and weekends on [email or web address]  Patient seen with PA, agree with the above note.   Impella 5.5 placed yesterday, currently at P7.  Stable overnight.   CVP 11-12 on my read this morning.  CI 2.59 by Fick and 1.8 by thermo. Questionable Swan function with difficulty pulling back off distal  port.  I did advance the [name] a couple of cm as well.  Excellent UOP overnight, creatinine stable 1.37.  Milrinone now down to 0.125, remains on amiodarone gtt 30.  Much less ectopy today, no NSVT overnight.   She feels better with Impella in place.   General: NAD Neck: RIJ swan, no thyromegaly or thyroid nodule.  Lungs: Clear to auscultation bilaterally with normal respiratory effort. CV: Lateral PMI.  Heart regular S1/S2, no S3/S4, no murmur.  No peripheral edema.   Abdomen: Soft, nontender, no hepatosplenomegaly, no distention.  Skin: Intact without lesions or rashes.  Neurologic: Alert and oriented x 3.  Psych: Normal affect. Extremities: No clubbing or cyanosis.  HEENT: Normal.    As above, CI excellent by Fick and low by thermo.  There is question of Swan function this morning, I am inclined to believe the Fick clinically. CVP suggests mild ongoing volume overload (Lasix turned off overnight with CVP down to 5, now up to 11-12).  - I will restart Lasix gtt 6 mg/hr, replace K aggressively.  - Increased Impella to P8, flow up to 4.2 with good waveforms.  Position by echo was excellent last night.  - Continue milrinone 0.125 for now, can likely stop in the near future.  - Workup ongoing for LVAD, not yet transplant candidate with BMI 40 and needs rapid decision on support.  Needs to mobilize with PT, up to chair etc.  Nutrition is an issue, low prealbumin.  Placing Cortrak today and will start tube feeds. Encouraging that she feels significantly better with Impella in place. Potential LVAD next week with Dr. Prescott Gum.   No NSVT overnight with Impella and decreased milrinone.  Continue amiodarone gtt.   Hyperthyroidism with ongoing treatment with methimazole and prednisone. Difficult situation but with cardiogenic shock need to proceed with consideration for long-term mechanical support.  We are not going to be able to stop amiodarone at this point with recent frequent ventricular ectopy and NSVT.  Will discuss with treatment team regarding management of the hyperthyroidism peri-operatively.  Dr Broadus John had been in touch with endocrinology, think we need to reach out to them again.   GYN-onc has seen for ovarian mass, further followup will be post-op.   CRITICAL CARE Performed by: [name]  Total critical care time: 40 minutes  Critical care time was exclusive of separately billable procedures and treating other patients.  Critical care was necessary to treat or prevent imminent or life-threatening deterioration.  Critical care was time spent personally by me on the following activities: development of treatment plan with patient and/or surrogate as well as nursing,  discussions with consultants, evaluation of patient's response to treatment, examination of patient, obtaining history from patient or surrogate, ordering and performing treatments and interventions, ordering and review of laboratory studies, ordering and review of radiographic studies, pulse oximetry and re-evaluation of patient's condition.  [name] 08/30/2021 8:33 AM

## 2021-08-30 NOTE — Procedures (Signed)
Cortrak ? ?Person Inserting Tube:  Marveen Reeks, RD ?Tube Type:  Cortrak - 43 inches ?Tube Size:  10 ?Tube Location:  Left nare ?Initial Placement:  Stomach ?Secured by: Dorann Lodge ?Technique Used to Measure Tube Placement:  Marking at nare/corner of mouth ?Cortrak Secured At:  65 cm ? ?Cortrak Tube Team Note: ? ?Consult received to place a Cortrak feeding tube.  ? ?X-ray is required, abdominal x-ray has been ordered by the Cortrak team. Please confirm tube placement before using the Cortrak tube.  ? ?If the tube becomes dislodged please keep the tube and contact the Cortrak team at [email or web address] ([email or web address]) for replacement.  ?If after hours and replacement cannot be delayed, place a NG tube and confirm placement with an abdominal x-ray.  ? ? ?Hermina Barters RD, LDN ?Clinical Dietitian ?See AMiON for contact information.  ? ? ?

## 2021-08-30 NOTE — Progress Notes (Signed)
RT attempted arterial line x2 unsuccessful both attempts. MD made aware at this time. RT will continue to monitor.  ?

## 2021-08-30 NOTE — Progress Notes (Signed)
OT Cancellation Note ? ?Patient Details ?Name: [name] ?MRN: [id number] ?DOB: [birth year] ? ? ?Cancelled Treatment:    Reason Eval/Treat Not Completed: Active bedrest order, Potential VAD on Tuesday per chart review. OT will follow acutely and evaluate once activity orders are updated. ? ?[name] ?08/30/2021, 1:53 PM ? ?Jesse Sans OTR/L ?Acute Rehabilitation Services ?Pager: [phone] ?Office: [phone] ? ?

## 2021-08-31 DIAGNOSIS — I509 Heart failure, unspecified: Secondary | ICD-10-CM | POA: Diagnosis not present

## 2021-08-31 DIAGNOSIS — E0590 Thyrotoxicosis, unspecified without thyrotoxic crisis or storm: Secondary | ICD-10-CM | POA: Diagnosis not present

## 2021-08-31 LAB — POCT I-STAT 7, (LYTES, BLD GAS, ICA,H+H)
Acid-Base Excess: 10 mmol/L — ABNORMAL HIGH (ref 0.0–2.0)
Bicarbonate: 33.9 mmol/L — ABNORMAL HIGH (ref 20.0–28.0)
Calcium, Ion: 1.21 mmol/L (ref 1.15–1.40)
HCT: 32 % — ABNORMAL LOW (ref 36.0–46.0)
Hemoglobin: 10.9 g/dL — ABNORMAL LOW (ref 12.0–15.0)
O2 Saturation: 98 %
Patient temperature: 36.6
Potassium: 2.9 mmol/L — ABNORMAL LOW (ref 3.5–5.1)
Sodium: 134 mmol/L — ABNORMAL LOW (ref 135–145)
TCO2: 35 mmol/L — ABNORMAL HIGH (ref 22–32)
pCO2 arterial: 42.4 mmHg (ref 32–48)
pH, Arterial: 7.51 — ABNORMAL HIGH (ref 7.35–7.45)
pO2, Arterial: 86 mmHg (ref 83–108)

## 2021-08-31 LAB — GLUCOSE, CAPILLARY
Glucose-Capillary: 119 mg/dL — ABNORMAL HIGH (ref 70–99)
Glucose-Capillary: 123 mg/dL — ABNORMAL HIGH (ref 70–99)
Glucose-Capillary: 141 mg/dL — ABNORMAL HIGH (ref 70–99)
Glucose-Capillary: 134 mg/dL — ABNORMAL HIGH (ref 70–99)
Glucose-Capillary: 108 mg/dL — ABNORMAL HIGH (ref 70–99)

## 2021-08-31 LAB — PHOSPHORUS
Phosphorus: 4.4 mg/dL (ref 2.5–4.6)
Phosphorus: 3.6 mg/dL (ref 2.5–4.6)

## 2021-08-31 LAB — URINALYSIS, COMPLETE (UACMP) WITH MICROSCOPIC
Bilirubin Urine: NEGATIVE
Glucose, UA: >=500 mg/dL — AB
Ketones, ur: NEGATIVE mg/dL
Nitrite: NEGATIVE
Protein, ur: 30 mg/dL — AB
Specific Gravity, Urine: 1.012 (ref 1.005–1.030)
pH: 5 (ref 5.0–8.0)

## 2021-08-31 LAB — COOXEMETRY PANEL
Carboxyhemoglobin: 1.3 % (ref 0.5–1.5)
Methemoglobin: 0.9 % (ref 0.0–1.5)
O2 Saturation: 65 %
Total hemoglobin: 9.8 g/dL — ABNORMAL LOW (ref 12.0–16.0)

## 2021-08-31 LAB — SURGICAL PCR SCREEN
MRSA, PCR: NEGATIVE
Staphylococcus aureus: NEGATIVE

## 2021-08-31 LAB — BASIC METABOLIC PANEL
Anion gap: 9 (ref 5–15)
Anion gap: 10 (ref 5–15)
BUN: 28 mg/dL — ABNORMAL HIGH (ref 6–20)
BUN: 28 mg/dL — ABNORMAL HIGH (ref 6–20)
CO2: 31 mmol/L (ref 22–32)
CO2: 29 mmol/L (ref 22–32)
Calcium: 9 mg/dL (ref 8.9–10.3)
Calcium: 9.4 mg/dL (ref 8.9–10.3)
Chloride: 92 mmol/L — ABNORMAL LOW (ref 98–111)
Chloride: 94 mmol/L — ABNORMAL LOW (ref 98–111)
Creatinine, Ser: 1.1 mg/dL — ABNORMAL HIGH (ref 0.44–1.00)
Creatinine, Ser: 1.08 mg/dL — ABNORMAL HIGH (ref 0.44–1.00)
GFR, Estimated: >60 mL/min (ref >60)
GFR, Estimated: >60 mL/min (ref >60)
Glucose, Bld: 127 mg/dL — ABNORMAL HIGH (ref 70–99)
Glucose, Bld: 120 mg/dL — ABNORMAL HIGH (ref 70–99)
Potassium: 2.9 mmol/L — ABNORMAL LOW (ref 3.5–5.1)
Potassium: 4.7 mmol/L (ref 3.5–5.1)
Sodium: 132 mmol/L — ABNORMAL LOW (ref 135–145)
Sodium: 133 mmol/L — ABNORMAL LOW (ref 135–145)

## 2021-08-31 LAB — HEPARIN LEVEL (UNFRACTIONATED)
Heparin Unfractionated: 0.15 IU/mL — ABNORMAL LOW (ref 0.30–0.70)
Heparin Unfractionated: 0.28 IU/mL — ABNORMAL LOW (ref 0.30–0.70)
Heparin Unfractionated: 0.3 IU/mL (ref 0.30–0.70)

## 2021-08-31 LAB — CBC
HCT: 31.5 % — ABNORMAL LOW (ref 36.0–46.0)
Hemoglobin: 9.6 g/dL — ABNORMAL LOW (ref 12.0–15.0)
MCH: 22.7 pg — ABNORMAL LOW (ref 26.0–34.0)
MCHC: 30.5 g/dL (ref 30.0–36.0)
MCV: 74.5 fL — ABNORMAL LOW (ref 80.0–100.0)
Platelets: 193 10*3/uL (ref 150–400)
RBC: 4.23 MIL/uL (ref 3.87–5.11)
RDW: 19.7 % — ABNORMAL HIGH (ref 11.5–15.5)
WBC: 22.4 10*3/uL — ABNORMAL HIGH (ref 4.0–10.5)
nRBC: 4.6 % — ABNORMAL HIGH (ref 0.0–0.2)

## 2021-08-31 LAB — MAGNESIUM
Magnesium: 2.3 mg/dL (ref 1.7–2.4)
Magnesium: 2.3 mg/dL (ref 1.7–2.4)

## 2021-08-31 LAB — LACTATE DEHYDROGENASE: LDH: 492 U/L — ABNORMAL HIGH (ref 98–192)

## 2021-08-31 MED ORDER — POTASSIUM CHLORIDE CRYS ER 20 MEQ PO TBCR: 40.0000 meq | EXTENDED_RELEASE_TABLET | Freq: Once | ORAL | Status: DC

## 2021-08-31 MED ORDER — POTASSIUM CHLORIDE 10 MEQ/50ML IV SOLN: 10.0000 meq | INTRAVENOUS | Status: DC

## 2021-08-31 MED ORDER — POTASSIUM CHLORIDE CRYS ER 20 MEQ PO TBCR
60.0000 meq | EXTENDED_RELEASE_TABLET | Freq: Once | ORAL | Status: AC
  Filled 2021-08-31: qty 3

## 2021-08-31 MED ORDER — POTASSIUM CHLORIDE CRYS ER 20 MEQ PO TBCR: 30.0000 meq | EXTENDED_RELEASE_TABLET | Freq: Once | ORAL | Status: DC

## 2021-08-31 MED ORDER — POTASSIUM CHLORIDE 10 MEQ/50ML IV SOLN
10.0000 meq | INTRAVENOUS | Status: DC
  Administered 2021-08-31 (×3): 10 meq via INTRAVENOUS
  Filled 2021-08-31 (×4): qty 50

## 2021-08-31 NOTE — Evaluation (Signed)
Occupational Therapy Evaluation ?Patient Details ?Name: [name] ?MRN: [id number] ?DOB: 09/21/1970 ?Today's Date: 08/31/2021 ? ? ?History of Present Illness Pt adm 3/6 with acute on chronic heart failure exacerbation. Pt with impella placed  3/9. Possible LVAD pending. PMH - chf due to NICM, ICD, obesity, Lt CVA, pulmonary htn, ckd, HTN.  ? ?Clinical Impression ?  ?PTA patient independent with ADLS and mobility. Admitted for above and presents with weakness, R UE pain, impaired activity tolerance.  She requires up to mod assist for ADLS, deferred transfers as pt just transferred into recliner with nursing staff upon entry.  Educated on lap slides and engagement in [place] as tolerated.  She is highly motivated. She will benefit from continued OT services acutely to optimize independence and tolerance for activity.  Likely LVAD next week. Will follow.  ?   ? ?Recommendations for follow up therapy are one component of a multi-disciplinary discharge planning process, led by the attending physician.  Recommendations may be updated based on patient status, additional functional criteria and insurance authorization.  ? ?Follow Up Recommendations ? Other (comment) (TBD after LVAD)  ?  ?Assistance Recommended at Discharge Other (comment) (TBD after LVAD)  ?Patient can return home with the following   ? ?  ?Functional Status Assessment ? Patient has had a recent decline in their functional status and demonstrates the ability to make significant improvements in function in a reasonable and predictable amount of time.  ?Equipment Recommendations ? Other (comment) (TBD)  ?  ?Recommendations for Other Services   ? ? ?  ?Precautions / Restrictions Precautions ?Precautions: Fall;Other (comment) ?Precaution Comments: impella, swan  ? ?  ? ?Mobility Bed Mobility ?  ?  ?  ?  ?  ?  ?  ?General bed mobility comments: OOB in recliner upon entry ?  ? ?Transfers ?  ?  ?  ?  ?  ?  ?  ?  ?  ?  ?  ? ?  ?Balance Overall balance assessment:  Needs assistance ?Sitting-balance support: No upper extremity supported, Feet supported ?Sitting balance-Leahy Scale: Good ?  ?  ?  ?  ?  ?  ?  ?  ?  ?  ?  ?  ?  ?  ?  ?  ?   ? ?ADL either performed or assessed with clinical judgement  ? ?ADL Overall ADL's : Needs assistance/impaired ?  ?  ?Grooming: Wash/dry hands;Wash/dry face;Set up;Sitting;Oral care ?  ?Upper Body Bathing: Minimal assistance;Sitting ?  ?Lower Body Bathing: Moderate assistance;Sitting/lateral leans ?  ?Upper Body Dressing : Moderate assistance;Sitting ?Upper Body Dressing Details (indicate cue type and reason): d/t lines ?Lower Body Dressing: Moderate assistance;Sitting/lateral leans ?Lower Body Dressing Details (indicate cue type and reason): able to manage socks in sitting, requires assist for clothing at hips ?  ?Toilet Transfer Details (indicate cue type and reason): deferred- just transferred to recliner with nursing ?  ?  ?  ?  ?  ?General ADL Comments: encouraged pt to engage in ADLs daily, as able  ? ? ? ?Vision   ?Vision Assessment?: No apparent visual deficits  ?   ?Perception   ?  ?Praxis   ?  ? ?Pertinent Vitals/Pain Pain Assessment ?Pain Assessment: Faces ?Faces Pain Scale: Hurts even more ?Pain Location: R UE ?Pain Descriptors / Indicators: Discomfort, Constant, Pins and needles ?Pain Intervention(s): Limited activity within patient's tolerance, Monitored during session, Repositioned  ? ? ? ?Hand Dominance Right ?  ?Extremity/Trunk Assessment Upper Extremity Assessment ?Upper  Extremity Assessment: RUE deficits/detail;Generalized weakness ?RUE Deficits / Details: limited due to lines, but pain in shoulder is constant (down arms comes and goes).  Limited shoulder flexion to approx 30* (pain), but WFL distally. ?RUE Sensation: WNL ?RUE Coordination: decreased gross motor ?  ?Lower Extremity Assessment ?Lower Extremity Assessment: Defer to PT evaluation ?  ?  ?  ?Communication Communication ?Communication: No difficulties ?  ?Cognition  Arousal/Alertness: Awake/alert ?Behavior During Therapy: [facility] for tasks assessed/performed ?Overall Cognitive Status: Within Functional Limits for tasks assessed ?  ?  ?  ?  ?  ?  ?  ?  ?  ?  ?  ?  ?  ?  ?  ?  ?  ?  ?  ?General Comments  VSS on Erin 1L ? ?  ?Exercises Exercises: Other exercises ?Other Exercises ?Other Exercises: lap slides x 5 reps, R shoulder ?  ?Shoulder Instructions    ? ? ?Home Living Family/patient expects to be discharged to:: Private residence ?Living Arrangements: Spouse/significant other ?Available Help at Discharge: Family;Available 24 hours/day ?Type of Home: House ?Home Access: Stairs to enter ?Entrance Stairs-Number of Steps: 3 ?Entrance Stairs-Rails: None ?Home Layout: Two level ?Alternate Level Stairs-Number of Steps: flight ?Alternate Level Stairs-Rails: Right;Left ?Bathroom Shower/Tub: Tub/shower unit ?  ?Bathroom Toilet: Standard ?  ?  ?Home Equipment: None ?  ?Additional Comments: reports planning on setting herself up on the first floor once dcd home ?  ? ?  ?Prior Functioning/Environment Prior Level of Function : Independent/Modified Independent ?  ?  ?  ?  ?  ?  ?Mobility Comments: No assistive device. Limited by sob ?ADLs Comments: independent, managing light IADLs ?  ? ?  ?  ?OT Problem List: Decreased strength;Decreased range of motion;Decreased activity tolerance;Impaired balance (sitting and/or standing);Decreased knowledge of use of DME or AE;Decreased knowledge of precautions;Cardiopulmonary status limiting activity;Obesity;Pain ?  ?   ?OT Treatment/Interventions: Self-care/ADL training;Therapeutic exercise;DME and/or AE instruction;Therapeutic activities;Patient/family education;Balance training  ?  ?OT Goals(Current goals can be found in the care plan section) Acute Rehab OT Goals ?Patient Stated Goal: get better ?OT Goal Formulation: With patient ?Time For Goal Achievement: 09/14/21 ?Potential to Achieve Goals: Good  ?OT Frequency: Min 2X/week ?  ? ?Co-evaluation   ?  ?   ?  ?  ? ?  ?AM-PAC OT "6 Clicks" Daily Activity     ?Outcome Measure Help from another person eating meals?: A Little ?Help from another person taking care of personal grooming?: A Little ?Help from another person toileting, which includes using toliet, bedpan, or urinal?: A Lot ?Help from another person bathing (including washing, rinsing, drying)?: A Lot ?Help from another person to put on and taking off regular upper body clothing?: A Little ?Help from another person to put on and taking off regular lower body clothing?: A Lot ?6 Click Score: 15 ?  ?End of Session Equipment Utilized During Treatment: Oxygen ?Nurse Communication: Mobility status ? ?Activity Tolerance: Patient tolerated treatment well ?Patient left: in chair;with call Dewan/phone within reach ? ?OT Visit Diagnosis: Pain;Other abnormalities of gait and mobility (R26.89);Muscle weakness (generalized) (M62.81) ?Pain - Right/Left: Right ?Pain - part of body: Shoulder;Arm  ?              ?Time: 9675-9163 ?OT Time Calculation (min): 20 min ?Charges:  OT General Charges ?$OT Visit: 1 Visit ?OT Evaluation ?$OT Eval Moderate Complexity: 1 Mod ? ?Jolaine Artist, OT ?Acute Rehabilitation Services ?Pager [phone] ?Office [phone] ? ? ?Delight Stare ?08/31/2021, 1:54 PM ?

## 2021-08-31 NOTE — Progress Notes (Signed)
ANTICOAGULATION CONSULT NOTE ? ?Pharmacy Consult for heparin ?Indication:  impella 5.5 ? ?No Known Allergies ? ?Patient Measurements: ?Height: '5\' 3"'$  (160 cm) ?Weight: 100.7 kg (222 lb) ?IBW/kg (Calculated) : 52.4 ?Heparin Dosing Weight: 76.1 kg ? ?Vital Signs: ?Temp: 98.1 ?F (36.7 ?C) (03/11 0400) ?Temp Source: Core (03/11 0000) ?BP: 87/45 (03/10 2230) ?Pulse Rate: 94 (03/11 0400) ? ?Labs: ?Recent Labs  ?  08/29/21 ?9798 08/29/21 ?1954 08/30/21 ?0252 08/30/21 ?9211 08/30/21 ?0645 08/30/21 ?1645 08/31/21 ?9417 08/31/21 ?0330  ?HGB 10.3*   < > 9.9* 10.5*  --   --  10.9* 9.6*  ?HCT 34.4*   < > 31.9* 31.0*  --   --  32.0* 31.5*  ?PLT 292  --  244  --   --   --   --  193  ?HEPARINUNFRC  --   --   --   --  <0.10* <0.10*  --  0.15*  ?CREATININE 1.31*   < > 1.37*  --   --  1.25*  --  1.10*  ? < > = values in this interval not displayed.  ? ? ? ?Estimated Creatinine Clearance: 69.3 mL/min (A) (by C-G formula based on SCr of 1.1 mg/dL (H)). ? ? ?Medical History: ?Past Medical History:  ?Diagnosis Date  ? Automatic implantable cardioverter-defibrillator in situ   ? Chronic CHF (congestive heart failure) (HCC)   ? a. EF 15-20% b. RHC (09/2013) RA 14, RV 57/22, PA 64/36 (48), PCWP 18, FIck CO/CI 3.7/1.6, PVR 8.1 WU, PA sat 47%   ? History of stomach ulcers   ? Hypertension   ? Hypotension   ? Hypothyroidism   ? Morbid obesity ([place])   ? Myocardial infarction [facility]) 08/2013  ? Nocturnal dyspnea   ? Nonischemic cardiomyopathy ([place])   ? Sinus tachycardia   ? Sleep apnea   ? Snoring-prob OSA 09/04/2011  ? [employer] ICD lead RECALL  [phone]   ? UARS (upper airway resistance syndrome) 09/04/2011  ? HST 12/2013:  AHI 4/hr (numerous episodes of airflow reduction that did not have concomitant desaturation)   ? ? ?Medications:  ?Infusions:  ? sodium chloride    ? sodium chloride    ? amiodarone 30 mg/hr (08/31/21 0400)  ? feeding supplement (VITAL 1.5 CAL) 30 mL/hr at 08/30/21 2314  ? furosemide (LASIX) 200 mg in dextrose 5% 100 mL ('2mg'$ /mL)  infusion 6 mg/hr (08/31/21 0400)  ? heparin 50 units/mL (Impella PURGE) in dextrose 5 % 1000 mL bag    ? heparin 200 Units/hr (08/31/21 0400)  ? milrinone 0.125 mcg/kg/min (08/31/21 0400)  ? ? ?Assessment: ?51 yo female s/p Impella 5.5 placement on 08/29/21. No PTA anticoagulation or indication for [facility] other than Impella.  ?-heparin purge at 13 mL/hr ? ?3/11 AM update: ?Heparin level 0.15 ? ?Goal of Therapy:  ?Heparin level 0.2-0.5 units/ml ?Monitor platelets by anticoagulation protocol: Yes ?  ?Plan:  ?-Increase systemic heparin to 300 units/hr ?-Re-check heparin level at 1200 ?-Heparin level q12h and CBC daily ? ?Narda Bonds, PharmD, BCPS ?Clinical Pharmacist ?Phone: [phone] ? ? ? ?

## 2021-08-31 NOTE — Progress Notes (Signed)
Platelet Inhibition lab ordered; specimen must be sent outside facility. Lab advised RN that specimens not sent on weekends. Lab retimed to Monday 3/13 at 0800 per lab's suggestion. ?

## 2021-08-31 NOTE — Progress Notes (Signed)
Follow up note on hyperthyroid.  ? ?Patient in preparation for VAT on 09/03/21.  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  ? ?Awake and alert, no tremors  ?Neck not tender to palpation ?Heart with S1 and S2 present and tachycardic, no gallops or murmurs ?Respiratory with no wheezing ?Abdomen soft ?No lower extremity edema.  ? ?Amiodarone induce hyperthyroidism.  ?Clinically seems to be controlled ?Continue with methimazole and prednisone. ?She has been on treatment since 03.06.23, will check thyroid profile in am.  ? ?

## 2021-08-31 NOTE — Progress Notes (Signed)
?   ?  [address] ?      [place] [address] ?            [phone]      ? ?No events ?Minimal output from JP ?Good flows on impella ? ?Will remove JP drain today ? ? ?.me ? ?

## 2021-08-31 NOTE — Progress Notes (Signed)
ANTICOAGULATION CONSULT NOTE ? ?Pharmacy Consult for heparin ?Indication:  impella 5.5 ? ?No Known Allergies ? ?Patient Measurements: ?Height: '5\' 3"'$  (160 cm) ?Weight: 101.4 kg (223 lb 8.7 oz) ?IBW/kg (Calculated) : 52.4 ?Heparin Dosing Weight: 76.1 kg ? ?Vital Signs: ?Temp: 98.4 ?F (36.9 ?C) (03/11 1700) ?Pulse Rate: 107 (03/11 1700) ? ?Labs: ?Recent Labs  ?  08/29/21 ?6834 08/29/21 ?1954 08/30/21 ?0252 08/30/21 ?1962 08/30/21 ?0645 08/30/21 ?1645 08/31/21 ?2297 08/31/21 ?0330 08/31/21 ?1223 08/31/21 ?1641  ?HGB 10.3*   < > 9.9* 10.5*  --   --  10.9* 9.6*  --   --   ?HCT 34.4*   < > 31.9* 31.0*  --   --  32.0* 31.5*  --   --   ?PLT 292  --  244  --   --   --   --  193  --   --   ?HEPARINUNFRC  --   --   --   --    < > <0.10*  --  0.15* 0.28* 0.30  ?CREATININE 1.31*   < > 1.37*  --   --  1.25*  --  1.10*  --  1.08*  ? < > = values in this interval not displayed.  ? ? ? ?Estimated Creatinine Clearance: 70.8 mL/min (A) (by C-G formula based on SCr of 1.08 mg/dL (H)). ? ? ?Medical History: ?Past Medical History:  ?Diagnosis Date  ? Automatic implantable cardioverter-defibrillator in situ   ? Chronic CHF (congestive heart failure) (HCC)   ? a. EF 15-20% b. RHC (09/2013) RA 14, RV 57/22, PA 64/36 (48), PCWP 18, FIck CO/CI 3.7/1.6, PVR 8.1 WU, PA sat 47%   ? History of stomach ulcers   ? Hypertension   ? Hypotension   ? Hypothyroidism   ? Morbid obesity ([place])   ? Myocardial infarction [facility]) 08/2013  ? Nocturnal dyspnea   ? Nonischemic cardiomyopathy ([place])   ? Sinus tachycardia   ? Sleep apnea   ? Snoring-prob OSA 09/04/2011  ? [employer] ICD lead RECALL  [phone]   ? UARS (upper airway resistance syndrome) 09/04/2011  ? HST 12/2013:  AHI 4/hr (numerous episodes of airflow reduction that did not have concomitant desaturation)   ? ? ?Medications:  ?Infusions:  ? sodium chloride    ? sodium chloride    ? amiodarone 30 mg/hr (08/31/21 1600)  ? feeding supplement (VITAL 1.5 CAL) 1,000 mL (08/31/21 1643)  ? heparin 50 units/mL  (Impella PURGE) in dextrose 5 % 1000 mL bag    ? heparin 300 Units/hr (08/31/21 1600)  ? milrinone 0.25 mcg/kg/min (08/31/21 1600)  ? ? ?Assessment: ?51 yo female s/p Impella 5.5 placement on 08/29/21. No PTA anticoagulation or indication for [facility] other than Impella.  ?-heparin purge at 12.2 mL/hr ? ?Heparin level is therapeutic at 0.3. ? ?Goal of Therapy:  ?Heparin level 0.2-0.5 units/ml ?Monitor platelets by anticoagulation protocol: Yes ?  ?Plan:  ?-Continue systemic heparin at 300 units/hr  ?-Continue heparin through impella purge  ?-Heparin level q12h and CBC daily ?-Monitor for s/x bleeding ? ? ?[name], PharmD, BCPS, BCCP ?Clinical Pharmacist ?[phone] ?Please check AMION for all [place] numbers ?08/31/2021 ? ? ? ? ?

## 2021-08-31 NOTE — Progress Notes (Signed)
ANTICOAGULATION CONSULT NOTE ? ?Pharmacy Consult for heparin ?Indication:  impella 5.5 ? ?No Known Allergies ? ?Patient Measurements: ?Height: '5\' 3"'$  (160 cm) ?Weight: 101.4 kg (223 lb 8.7 oz) ?IBW/kg (Calculated) : 52.4 ?Heparin Dosing Weight: 76.1 kg ? ?Vital Signs: ?Temp: 98.1 ?F (36.7 ?C) (03/11 1241) ?Pulse Rate: 99 (03/11 1241) ? ?Labs: ?Recent Labs  ?  08/29/21 ?3267 08/29/21 ?1954 08/30/21 ?0252 08/30/21 ?1245 08/30/21 ?0645 08/30/21 ?1645 08/31/21 ?8099 08/31/21 ?0330 08/31/21 ?1223  ?HGB 10.3*   < > 9.9* 10.5*  --   --  10.9* 9.6*  --   ?HCT 34.4*   < > 31.9* 31.0*  --   --  32.0* 31.5*  --   ?PLT 292  --  244  --   --   --   --  193  --   ?HEPARINUNFRC  --   --   --   --    < > <0.10*  --  0.15* 0.28*  ?CREATININE 1.31*   < > 1.37*  --   --  1.25*  --  1.10*  --   ? < > = values in this interval not displayed.  ? ? ? ?Estimated Creatinine Clearance: 69.5 mL/min (A) (by C-G formula based on SCr of 1.1 mg/dL (H)). ? ? ?Medical History: ?Past Medical History:  ?Diagnosis Date  ? Automatic implantable cardioverter-defibrillator in situ   ? Chronic CHF (congestive heart failure) (HCC)   ? a. EF 15-20% b. RHC (09/2013) RA 14, RV 57/22, PA 64/36 (48), PCWP 18, FIck CO/CI 3.7/1.6, PVR 8.1 WU, PA sat 47%   ? History of stomach ulcers   ? Hypertension   ? Hypotension   ? Hypothyroidism   ? Morbid obesity ([place])   ? Myocardial infarction [facility]) 08/2013  ? Nocturnal dyspnea   ? Nonischemic cardiomyopathy ([place])   ? Sinus tachycardia   ? Sleep apnea   ? Snoring-prob OSA 09/04/2011  ? [employer]idel[employer] ICD lead RECALL  [phone]   ? UARS (upper airway resistance syndrome) 09/04/2011  ? HST 12/2013:  AHI 4/hr (numerous episodes of airflow reduction that did not have concomitant desaturation)   ? ? ?Medications:  ?Infusions:  ? sodium chloride    ? sodium chloride    ? amiodarone 30 mg/hr (08/31/21 1200)  ? feeding supplement (VITAL 1.5 CAL) 40 mL/hr at 08/31/21 1200  ? heparin 50 units/mL (Impella PURGE) in dextrose 5 % 1000 mL bag    ?  heparin 300 Units/hr (08/31/21 1200)  ? milrinone 0.125 mcg/kg/min (08/31/21 1200)  ? ? ?Assessment: ?50 yo female s/p Impella 5.5 placement on 08/29/21. No PTA anticoagulation or indication for [facility] other than Impella.  ?-heparin purge at 12.2 mL/hr ? ?Heparin level this afternoon therapeutic at 0.28. LDH up from 367 to 492. No s/x of bleeding per RN. Total heparin systemic + purge= 910 units/hr. ? ?Goal of Therapy:  ?Heparin level 0.2-0.5 units/ml ?Monitor platelets by anticoagulation protocol: Yes ?  ?Plan:  ?-Continue systemic heparin at 300 units/hr  ?-Continue heparin through impella purge  ?-Heparin level q12h and CBC daily ?-Monitor for s/x bleeding ? ?Cathrine Muster, PharmD ?PGY2 Cardiology Pharmacy Resident ?Phone: [phone] ?08/31/2021  1:13 PM ? ?Please check [email or web address] for unit-specific pharmacy phone numbers. ? ? ? ? ?

## 2021-08-31 NOTE — Progress Notes (Addendum)
Patient ID: ELANOR CALE, female   DOB: [birth year], 51 y.o.   MRN: [id number]     Advanced Heart Failure Rounding Note  PCP-Cardiologist: [name], MD   Subjective:    3/6 Admitted w/ a/c systolic heart failure, NYHA Class IIIb-IV w/ low output, Started on empiric milrinone 0.25 + lasix gtt. Amiodarone gtt started for frequent NSVT. VAD W/u begun 3/6 Tx for hyperthyroidism. TSH undetectable. Free T4 5.11. Started on Methimazole + prednisone. Levothyroxine d/c  3/7 CT abdomen/pelvis showed left ovarian cystic mass, pelvic US showed complex cystic left ovarian mass.  Unable to get MRI with ICD.  3/8 RHC on 0.25 of Milrinone: RA 15, PCWP 37, PA Sat 57%, FICK CI 1.96, Thermo CI 1.12, PAPi 1.33, PVR 4. Milrinone increased to 0.375. Lasix gtt resumed  03/09 Impella 5.5 placed, milrinone decreased to 0.125, neo titrated off  Impella at P 8, Flow 4.5 L/min Waveforms ok. No alarms   Continues on milrinone 0.125  Coox 65%  On lasix gtt. Diuresing well. Breathing much better. Denies SOB, orthopnea or PND. R arm a bit sore from impella. No significant hematoma   Swan repositioned personally and swan #s shot personally.   Swan #s CVP 4 PA 44/30 (35) PCWP 19 CO 4.1 CI 2.0 PAPi 3.5 SVR 1450 LDH 367 -> 492  RHC 3/8 (on Milrinone 0.25)   RA mean 15 RV 63/20 PA 64/44, mean 53 PCWP mean 37 PAPI 1.33   Oxygen saturations: PA 52% AO 100%  Cardiac Output (Fick) 3.97  Cardiac Index (Fick) 1.96  PVR 4 WU  Cardiac Output (thermo) 2.27 Cardiac Index (thermo) 1.12  1. Elevated right and left heart filling pressures.  2. 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  3. Low cardiac output, marked difference between Fick and thermodilution.  4. PAPI low at 1.33.   Objective:   Weight Range: 101.4 kg Body mass index is 39.6 kg/m.   Vital Signs:   Temp:  [97.3 F (36.3 C)-98.6 F (37 C)] 97.9 F (36.6 C) (03/11 1200) Pulse Rate:  [89-104] 100 (03/11  1200) Resp:  [5-24] 20 (03/11 1200) BP: (87-93)/(45-56) 87/45 (03/10 2230) SpO2:  [91 %-100 %] 98 % (03/11 1200) Arterial Line BP: (88-105)/(62-80) 89/80 (03/11 1100) Weight:  [101.4 kg] 101.4 kg (03/11 0456) Last BM Date :  (PTA)  Weight change: Filed Weights   08/29/21 0600 08/29/21 1254 08/31/21 0456  Weight: 100.9 kg 100.7 kg 101.4 kg    Intake/Output:   Intake/Output Summary (Last 24 hours) at 08/31/2021 1238 Last data filed at 08/31/2021 1200 Gross per 24 hour  Intake 1960.17 ml  Output 4780 ml  Net -2819.83 ml       Physical Exam    General:  Sitting in chair No resp difficulty HEENT: normal + cor-trak Neck: supple. RIJ swan  Carotids 2+ bilat; no bruits. No lymphadenopathy or thryomegaly appreciated. Cor: L axillary impella. Site ok Regular rate & rhythm. Impella hum Lungs: clear Abdomen: soft, nontender, nondistended. No hepatosplenomegaly. No bruits or masses. Good bowel sounds. Extremities: no cyanosis, clubbing, rash, edema Neuro: alert & orientedx3, cranial nerves grossly intact. moves all 4 extremities w/o difficulty. Affect pleasant   Telemetry   Sinus tach 100-105 ~ 5 PVCs/min Personally reviewed  Labs    CBC Recent Labs    08/30/21 0252 08/30/21 0517 08/31/21 0323 08/31/21 0330  WBC 21.0*  --   --  22.4*  HGB 9.9*   < > 10.9* 9.6*  HCT 31.9*   < >  32.0* 31.5*  MCV 73.8*  --   --  74.5*  PLT 244  --   --  193   < > = values in this interval not displayed.    Basic Metabolic Panel Recent Labs    08/30/21 1645 08/31/21 0323 08/31/21 0330  NA 134* 134* 132*  K 3.7 2.9* 2.9*  CL 97*  --  92*  CO2 27  --  31  GLUCOSE 133*  --  127*  BUN 29*  --  28*  CREATININE 1.25*  --  1.10*  CALCIUM 9.2  --  9.0  MG 2.6*  --  2.3  PHOS 4.1  --  4.4    Liver Function Tests No results for input(s): AST, ALT, ALKPHOS, BILITOT, PROT, ALBUMIN in the last 72 hours.  No results for input(s): LIPASE, AMYLASE in the last 72 hours. Cardiac  Enzymes No results for input(s): CKTOTAL, CKMB, CKMBINDEX, TROPONINI in the last 72 hours.  BNP: BNP (last 3 results) Recent Labs    07/18/21 1647 08/21/21 1208 08/25/21 1728  BNP 1,331.6* 957.7* 1,117.0*     ProBNP (last 3 results) No results for input(s): PROBNP in the last 8760 hours.   D-Dimer No results for input(s): DDIMER in the last 72 hours.  Hemoglobin A1C No results for input(s): HGBA1C in the last 72 hours.  Fasting Lipid Panel No results for input(s): CHOL, HDL, LDLCALC, TRIG, CHOLHDL, LDLDIRECT in the last 72 hours.  Thyroid Function Tests No results for input(s): TSH, T4TOTAL, T3FREE, THYROIDAB in the last 72 hours.  Invalid input(s): FREET3   Other results:   Imaging    DG Abd Portable 1V  Result Date: 08/30/2021 CLINICAL DATA:  NG tube placement EXAM: PORTABLE ABDOMEN - 1 VIEW COMPARISON:  CT abdomen/pelvis 08/26/2021 FINDINGS: The enteric catheter tip projects over the distal stomach. A left chest wall cardiac device and Impella device are noted. There is a nonobstructive bowel gas pattern in the imaged upper abdomen. There is no definite free intraperitoneal air. The bones are unremarkable. IMPRESSION: Enteric catheter tip in the distal stomach. Electronically Signed   By: Valetta Mole M.D.   On: 08/30/2021 13:44     Medications:     Scheduled Medications:  (feeding supplement) PROSource Plus  60 mL Oral TID BM   aspirin EC  81 mg Oral Daily   Chlorhexidine Gluconate Cloth  6 each Topical Daily   digoxin  0.0625 mg Oral Daily   feeding supplement (PROSource TF)  45 mL Per Tube Daily   insulin aspart  0-15 Units Subcutaneous TID WC   insulin aspart  0-5 Units Subcutaneous QHS   mouth rinse  15 mL Mouth Rinse BID   methimazole  10 mg Oral Daily   methimazole  5 mg Oral QPM   multivitamin with minerals  1 tablet Oral Daily   polyethylene glycol  17 g Oral Daily   potassium chloride  40 mEq Oral Once   potassium chloride  60 mEq Oral BID    predniSONE  20 mg Oral Q breakfast   quiniDINE gluconate  324 mg Oral BID   rosuvastatin  40 mg Oral QHS   senna-docusate  2 tablet Oral BID   sodium chloride flush  10-40 mL Intracatheter Q12H   sodium chloride flush  3 mL Intravenous Q12H   sodium chloride flush  3 mL Intravenous Q12H   spironolactone  25 mg Oral Daily    Infusions:  sodium chloride     sodium  chloride     amiodarone 30 mg/hr (08/31/21 1200)   feeding supplement (VITAL 1.5 CAL) 40 mL/hr at 08/31/21 1200   furosemide (LASIX) 200 mg in dextrose 5% 100 mL ('2mg'$ /mL) infusion 6 mg/hr (08/31/21 1200)   heparin 50 units/mL (Impella PURGE) in dextrose 5 % 1000 mL bag     heparin 300 Units/hr (08/31/21 1200)   milrinone 0.125 mcg/kg/min (08/31/21 1200)    PRN Medications: sodium chloride, sodium chloride, Place/Maintain arterial line **AND** sodium chloride, acetaminophen, ondansetron (ZOFRAN) IV, ondansetron (ZOFRAN) IV, sodium chloride flush, sodium chloride flush    Patient Profile   MILEIGH TILLEY is a 51 y.o. 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 Started on empiric milrinone and lasix gtt.   Assessment/Plan    1. Acute on chronic systolic HF: Nonischemic cardiomyopathy.  She has a [employer] ICD. Cause for cardiomyopathy is uncertain, initially identified peri-partum (after son's birth), so cannot rule out peri-partum CMP.  On and off, she has had frequent PVCs.   RHC in 8/15 showed normal filling pressures and preserved cardiac output. Echo in 4/22 with EF 25%, severe LV dilation.  CPX (8/22) with mild-moderate HF limitation.  Echo this admission with EF < 20% with severe dilation, normal RV size with mildly decreased systolic function, moderate-severe functional MR with dilated IVC. She  was admitted with volume overload, NYHA class IV symptoms and low output. HCO3 18 and creatinine 1.5 on admit, co-ox 47%. Milrinone 0.25 started + lasix gtt.  I also suspect hyperthyroidism is playing a role in her CHF exacerbation. RHC on 3/8 on 0.25 of Milrinone showed elevated filling pressures, moderate mixed pulmonary venous/pulmonary arterial hypertension and low output: RA 15, PCWP 37, PA Sat 57%, FICK CI 1.96, Thermo CI 1.12, PAPi 1.33, PVR 4. Milrinone increased to 0.375. IV Lasix gtt resumed  - S/p Impella 5.5 on 03/09. LDH stable. Heparin in purge. Continues on milrinone 0.125. Co-ox 65 this am. [name] numbers as above. Impella waveforms/alarms reviewed personally. Parameters stable. - Impella at P8. SVR high. CI marginal. Increase milrinone back to 0.25. Watch for ectopy.  - CVP 4. Hold lasix gtt for now.  - Worry about RV but PAPi improved today - Hold Entresto for now. SBP 80s with narrow pulse pressure - Hold Coreg with marked volume overload and low output.     - Continue spironolactone 25 mg daily.   - Continue digoxin 0.0625 daily (lower dose).  Level was too high with digoxin 0.125 and concomitant quinidine in the past, level 0.2 today.  - Continue dapagliflozin 10 mg daily.  - ECG with LBBB, QRS 140 msec => with QRS < 150, Dr. [name] has not recommended CRT upgrade in the past.  - Needs advanced therapies (LVAD).  BMI too high currently for transplant (39.4), also with ovarian mass of uncertain etiology.  She is undergoing LVAD workup.  Underwent placement of Impella 5.5 to optimize for VAD on 03/09. Plan possible VAD on Tuesday 2. Pulmonary HTN: Mixed picture with PVR 8.1 on RHC at [facility].  However, repeat study in 8/15 here showed no significant pulmonary arterial hypertension. 2017 RHC with pulmonary venous hypertension. 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 Morbid obesity: BMI still appears out of range for heart transplant.  4.  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  She has been on Levoxyl at home but does not think she has taken for a month.  Now hyperthyroid.  This likely contributes to her CHF exacerbation. [facility] evaluated her for this. NSVT on milrinone makes it difficult for [name] to stop amiodarone.  - Started on prednisone 20 mg daily and methimazole bid.  - Will need outpatient endocrinology followup.  - As above, continuing amiodarone for now with NSVT.  5. PVCs: 11% PVCs + NSVT on 2/20 Zio patch.  Amiodarone started, but PVCs up to 13.3% with NSVT on 3/20 Zio patch. She saw Dr. [name] who thought that the ventricular arrhythmias were electromechanical due to worsening HF.  In 4/20, she had VT x 2 with ICD discharges in the setting of hypokalemia. Zio patch in 1/21 with PVCs down to 6.2% of beats. VT again in 2/22, quinidine started.  Initially w/ frequent NSVT on milrinone gtt. Started on amio gtt, less ectopy noted today. Milrinone down to 0.125. Increasing as above. Watch very closely for more ectopy - Plan to stop amiodarone gtt once off milrinone - Continue quinidine.  - EP consulted given complicated situation with hyperthyroidism and NSVT. They favor continuing amiodarone and quinadine at this time. No additional AAD therapy  6. CVA: CVA in 10/21, had thrombectomy with stent placement.  No LV thrombus noted and no atrial fibrillation noted.  - Talked with interventional radiology. >1 year post-stent, ok to stop ticagrelor and continue ASA.  7. Fe deficiency: She reports heavy periods.  Transferrin saturation low.  - Feraheme given 3/6, repeat today 8. CKD stage 3: Creatinine 1.5 on admit, 1.37 today. Follow with milrinone and diuresis.  9. Mitral regurgitation: Moderate-severe functional MR.  Unlikely to be Mitraclip candidate with severely dilated LV (>7 cm).  10. 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  Unable to get MRI with ICD. Seen by GYN. Felt to be most likely non-neoplastic. Tumor markers within normal range. Possible low grade malignancy, could also be benign.  No ascites noted on abdominal CT and no mention of abdominal spread.  She is not a surgical candidate at this time with low output HF.  - Plan at this point would be to stabilize with LVAD then investigate the ovarian mass.  - F/u with GYN as outpatient 11. Elevated WBCs: Suspect due to steroids, no evidence for infection.  12. Malnutrition: - Prealbumin 13 - Place CorTrak for tube feeds 13. Constipation: - Discussed bowel regimen with PharmD 14. Hypokalemia - supp K. Continue spiro   CRITICAL CARE Performed by: Glori Bickers  Total critical care time: 40 minutes  Critical care time was exclusive of separately billable procedures and treating other patients.  Critical care was necessary to treat or prevent imminent or life-threatening deterioration.  Critical care was time spent personally by me (independent of midlevel providers or residents) on the following activities: development of treatment plan with patient and/or surrogate as well as nursing, discussions with consultants, evaluation of patient's response to treatment, examination of patient, obtaining history from patient or surrogate, ordering and performing treatments and interventions, ordering and review of laboratory studies, ordering and review of radiographic studies, pulse oximetry and re-evaluation of patient's condition.   Length of Stay: [place], MD  08/31/2021, 12:38 PM  Advanced Heart Failure Team Pager [phone] (M-F; 7a - 5p)  Please contact [facility] for night-coverage after hours (5p -7a ) and weekends on [email or web address]

## 2021-09-01 ENCOUNTER — Inpatient Hospital Stay (HOSPITAL_COMMUNITY): Payer: BC Managed Care – PPO

## 2021-09-01 DIAGNOSIS — I509 Heart failure, unspecified: Secondary | ICD-10-CM | POA: Diagnosis not present

## 2021-09-01 LAB — BASIC METABOLIC PANEL
Anion gap: 8 (ref 5–15)
BUN: 26 mg/dL — ABNORMAL HIGH (ref 6–20)
CO2: 30 mmol/L (ref 22–32)
Calcium: 8.7 mg/dL — ABNORMAL LOW (ref 8.9–10.3)
Chloride: 93 mmol/L — ABNORMAL LOW (ref 98–111)
Creatinine, Ser: 1 mg/dL (ref 0.44–1.00)
GFR, Estimated: >60 mL/min (ref >60)
Glucose, Bld: 107 mg/dL — ABNORMAL HIGH (ref 70–99)
Potassium: 3.7 mmol/L (ref 3.5–5.1)
Sodium: 131 mmol/L — ABNORMAL LOW (ref 135–145)

## 2021-09-01 LAB — POCT I-STAT 7, (LYTES, BLD GAS, ICA,H+H)
Acid-Base Excess: 10 mmol/L — ABNORMAL HIGH (ref 0.0–2.0)
Bicarbonate: 34.4 mmol/L — ABNORMAL HIGH (ref 20.0–28.0)
Calcium, Ion: 1.21 mmol/L (ref 1.15–1.40)
HCT: 32 % — ABNORMAL LOW (ref 36.0–46.0)
Hemoglobin: 10.9 g/dL — ABNORMAL LOW (ref 12.0–15.0)
O2 Saturation: 98 %
Patient temperature: 36.6
Potassium: 3.8 mmol/L (ref 3.5–5.1)
Sodium: 135 mmol/L (ref 135–145)
TCO2: 36 mmol/L — ABNORMAL HIGH (ref 22–32)
pCO2 arterial: 43.3 mmHg (ref 32–48)
pH, Arterial: 7.506 — ABNORMAL HIGH (ref 7.35–7.45)
pO2, Arterial: 87 mmHg (ref 83–108)

## 2021-09-01 LAB — GLUCOSE, CAPILLARY
Glucose-Capillary: 103 mg/dL — ABNORMAL HIGH (ref 70–99)
Glucose-Capillary: 106 mg/dL — ABNORMAL HIGH (ref 70–99)
Glucose-Capillary: 107 mg/dL — ABNORMAL HIGH (ref 70–99)
Glucose-Capillary: 109 mg/dL — ABNORMAL HIGH (ref 70–99)
Glucose-Capillary: 110 mg/dL — ABNORMAL HIGH (ref 70–99)
Glucose-Capillary: 116 mg/dL — ABNORMAL HIGH (ref 70–99)
Glucose-Capillary: 97 mg/dL (ref 70–99)

## 2021-09-01 LAB — CBC
HCT: 30.8 % — ABNORMAL LOW (ref 36.0–46.0)
Hemoglobin: 9.1 g/dL — ABNORMAL LOW (ref 12.0–15.0)
MCH: 22.9 pg — ABNORMAL LOW (ref 26.0–34.0)
MCHC: 29.5 g/dL — ABNORMAL LOW (ref 30.0–36.0)
MCV: 77.4 fL — ABNORMAL LOW (ref 80.0–100.0)
Platelets: 165 10*3/uL (ref 150–400)
RBC: 3.98 MIL/uL (ref 3.87–5.11)
RDW: 20.9 % — ABNORMAL HIGH (ref 11.5–15.5)
WBC: 23 10*3/uL — ABNORMAL HIGH (ref 4.0–10.5)
nRBC: 4.9 % — ABNORMAL HIGH (ref 0.0–0.2)

## 2021-09-01 LAB — HEPATIC FUNCTION PANEL
ALT: 65 U/L — ABNORMAL HIGH (ref 0–44)
AST: 76 U/L — ABNORMAL HIGH (ref 15–41)
Albumin: 2.7 g/dL — ABNORMAL LOW (ref 3.5–5.0)
Alkaline Phosphatase: 104 U/L (ref 38–126)
Bilirubin, Direct: 0.2 mg/dL (ref 0.0–0.2)
Indirect Bilirubin: 0.8 mg/dL (ref 0.3–0.9)
Total Bilirubin: 1 mg/dL (ref 0.3–1.2)
Total Protein: 5.7 g/dL — ABNORMAL LOW (ref 6.5–8.1)

## 2021-09-01 LAB — HEPARIN LEVEL (UNFRACTIONATED)
Heparin Unfractionated: 0.26 IU/mL — ABNORMAL LOW (ref 0.30–0.70)
Heparin Unfractionated: 0.29 IU/mL — ABNORMAL LOW (ref 0.30–0.70)

## 2021-09-01 LAB — T4, FREE: Free T4: 3.6 ng/dL — ABNORMAL HIGH (ref 0.61–1.12)

## 2021-09-01 LAB — LACTATE DEHYDROGENASE: LDH: 510 U/L — ABNORMAL HIGH (ref 98–192)

## 2021-09-01 LAB — PROTIME-INR
INR: 1.2 (ref 0.8–1.2)
Prothrombin Time: 15.2 seconds (ref 11.4–15.2)

## 2021-09-01 LAB — PREALBUMIN: Prealbumin: 10.6 mg/dL — ABNORMAL LOW (ref 18–38)

## 2021-09-01 LAB — MAGNESIUM
Magnesium: 2.3 mg/dL (ref 1.7–2.4)
Magnesium: 2.3 mg/dL (ref 1.7–2.4)

## 2021-09-01 LAB — PHOSPHORUS
Phosphorus: 3.9 mg/dL (ref 2.5–4.6)
Phosphorus: 4.3 mg/dL (ref 2.5–4.6)

## 2021-09-01 LAB — COOXEMETRY PANEL
Carboxyhemoglobin: 1.7 % — ABNORMAL HIGH (ref 0.5–1.5)
Methemoglobin: 1.2 % (ref 0.0–1.5)
O2 Saturation: 81.8 %
Total hemoglobin: 8.5 g/dL — ABNORMAL LOW (ref 12.0–16.0)

## 2021-09-01 LAB — TSH: TSH: <0.01 u[IU]/mL — ABNORMAL LOW (ref 0.350–4.500)

## 2021-09-01 MED ORDER — SODIUM CHLORIDE 0.9 % IV SOLN: INTRAVENOUS | Status: DC | PRN

## 2021-09-01 MED ORDER — SORBITOL 70 % SOLN
30.0000 mL | Freq: Once | Status: AC
  Administered 2021-09-01: 30 mL
  Filled 2021-09-01: qty 30

## 2021-09-01 MED ORDER — BISACODYL 5 MG PO TBEC: 5.0000 mg | DELAYED_RELEASE_TABLET | Freq: Every day | ORAL | Status: DC | PRN

## 2021-09-01 MED ORDER — POTASSIUM CHLORIDE 10 MEQ/50ML IV SOLN
10.0000 meq | INTRAVENOUS | Status: AC
  Administered 2021-09-01 (×3): 10 meq via INTRAVENOUS
  Filled 2021-09-01 (×3): qty 50

## 2021-09-01 MED ORDER — LACTATED RINGERS IV BOLUS
250.0000 mL | Freq: Once | INTRAVENOUS | Status: AC
  Administered 2021-09-01: 250 mL via INTRAVENOUS

## 2021-09-01 MED ORDER — FUROSEMIDE 10 MG/ML IJ SOLN
60.0000 mg | Freq: Once | INTRAMUSCULAR | Status: AC
  Administered 2021-09-01: 60 mg via INTRAVENOUS
  Filled 2021-09-01: qty 6

## 2021-09-01 NOTE — Progress Notes (Signed)
ANTICOAGULATION CONSULT NOTE ? ?Pharmacy Consult for heparin ?Indication:  impella 5.5 ? ?No Known Allergies ? ?Patient Measurements: ?Height: '5\' 3"'$  (160 cm) ?Weight: 101.4 kg (223 lb 8.7 oz) ?IBW/kg (Calculated) : 52.4 ?Heparin Dosing Weight: 76.1 kg ? ?Vital Signs: ?Temp: 98.1 ?F (36.7 ?C) (03/12 1600) ?Temp Source: Core (03/12 0800) ?Pulse Rate: 100 (03/12 1600) ? ?Labs: ?Recent Labs  ?  08/30/21 ?0252 08/30/21 ?8676 08/31/21 ?0330 08/31/21 ?1223 08/31/21 ?1641 09/01/21 ?0331 09/01/21 ?0341 09/01/21 ?1637  ?HGB 9.9*   < > 9.6*  --   --  9.1* 10.9*  --   ?HCT 31.9*   < > 31.5*  --   --  30.8* 32.0*  --   ?PLT 244  --  193  --   --  165  --   --   ?LABPROT  --   --   --   --   --  15.2  --   --   ?INR  --   --   --   --   --  1.2  --   --   ?HEPARINUNFRC  --    < > 0.15*   < > 0.30 0.26*  --  0.29*  ?CREATININE 1.37*   < > 1.10*  --  1.08* 1.00  --   --   ? < > = values in this interval not displayed.  ? ? ? ?Estimated Creatinine Clearance: 76.5 mL/min (by C-G formula based on SCr of 1 mg/dL). ? ? ?Medical History: ?Past Medical History:  ?Diagnosis Date  ? Automatic implantable cardioverter-defibrillator in situ   ? Chronic CHF (congestive heart failure) (HCC)   ? a. EF 15-20% b. RHC (09/2013) RA 14, RV 57/22, PA 64/36 (48), PCWP 18, FIck CO/CI 3.7/1.6, PVR 8.1 WU, PA sat 47%   ? History of stomach ulcers   ? Hypertension   ? Hypotension   ? Hypothyroidism   ? Morbid obesity ([place])   ? Myocardial infarction [facility]) 08/2013  ? Nocturnal dyspnea   ? Nonischemic cardiomyopathy ([place])   ? Sinus tachycardia   ? Sleep apnea   ? Snoring-prob OSA 09/04/2011  ? Sprint Fidelis ICD lead RECALL  [phone]   ? UARS (upper airway resistance syndrome) 09/04/2011  ? HST 12/2013:  AHI 4/hr (numerous episodes of airflow reduction that did not have concomitant desaturation)   ? ? ?Medications:  ?Infusions:  ? sodium chloride    ? sodium chloride    ? sodium chloride 10 mL/hr at 09/01/21 1600  ? amiodarone 30 mg/hr (09/01/21 1600)  ? feeding  supplement (VITAL 1.5 CAL) 1,000 mL (09/01/21 1651)  ? heparin 50 units/mL (Impella PURGE) in dextrose 5 % 1000 mL bag 50 mL (09/01/21 0719)  ? heparin 300 Units/hr (09/01/21 1600)  ? milrinone 0.25 mcg/kg/min (09/01/21 1600)  ? ? ?Assessment: ?51 yo female s/p Impella 5.5 placement on 08/29/21. No PTA anticoagulation or indication for [facility] other than Impella.  ?-heparin purge at 12.9 mL/hr ? ?Heparin level is therapeutic at 0.29, infusion volumes stable.  ? ?Goal of Therapy:  ?Heparin level 0.2-0.5 units/ml ?Monitor platelets by anticoagulation protocol: Yes ?  ?Plan:  ?-Continue systemic heparin at 300 units/hr  ?-Continue heparin through impella purge  ?-Heparin level q12h and CBC daily ?-Monitor for s/x bleeding ? ? ?Arrie Senate, PharmD, BCPS, BCCP ?Clinical Pharmacist ?[phone] ?Please check AMION for all [place] numbers ?09/01/2021 ? ? ? ?

## 2021-09-01 NOTE — Progress Notes (Signed)
?   ?  [address] ?      [place] [address] ?            [phone]      ? ?No events ?Good flows on impella ?Pocket stable ? ?OR on tues ? ?[name] ? ?

## 2021-09-01 NOTE — Progress Notes (Signed)
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  ? ?Follow up thyroid function:  ?TSH <0.010 ?Free T 4 down to 3,6 from 5.11  ?Free T3 pending  ? ?Will continue medical management with methimazole and steroids.  ?Will follow on free T3  ? ?

## 2021-09-01 NOTE — Progress Notes (Signed)
ANTICOAGULATION CONSULT NOTE ? ?Pharmacy Consult for heparin ?Indication:  impella 5.5 ? ?No Known Allergies ? ?Patient Measurements: ?Height: '5\' 3"'$  (160 cm) ?Weight: 101.4 kg (223 lb 8.7 oz) ?IBW/kg (Calculated) : 52.4 ?Heparin Dosing Weight: 76.1 kg ? ?Vital Signs: ?Temp: 98.4 ?F (36.9 ?C) (03/12 0600) ?Temp Source: Core (03/12 0500) ?Pulse Rate: 96 (03/12 0600) ? ?Labs: ?Recent Labs  ?  08/30/21 ?0252 08/30/21 ?5631 08/31/21 ?0330 08/31/21 ?1223 08/31/21 ?1641 09/01/21 ?0331 09/01/21 ?0341  ?HGB 9.9*   < > 9.6*  --   --  9.1* 10.9*  ?HCT 31.9*   < > 31.5*  --   --  30.8* 32.0*  ?PLT 244  --  193  --   --  165  --   ?LABPROT  --   --   --   --   --  15.2  --   ?INR  --   --   --   --   --  1.2  --   ?HEPARINUNFRC  --    < > 0.15* 0.28* 0.30 0.26*  --   ?CREATININE 1.37*   < > 1.10*  --  1.08* 1.00  --   ? < > = values in this interval not displayed.  ? ? ? ?Estimated Creatinine Clearance: 76.5 mL/min (by C-G formula based on SCr of 1 mg/dL). ? ? ?Medical History: ?Past Medical History:  ?Diagnosis Date  ? Automatic implantable cardioverter-defibrillator in situ   ? Chronic CHF (congestive heart failure) (HCC)   ? a. EF 15-20% b. RHC (09/2013) RA 14, RV 57/22, PA 64/36 (48), PCWP 18, FIck CO/CI 3.7/1.6, PVR 8.1 WU, PA sat 47%   ? History of stomach ulcers   ? Hypertension   ? Hypotension   ? Hypothyroidism   ? Morbid obesity ([place])   ? Myocardial infarction [facility]) 08/2013  ? Nocturnal dyspnea   ? Nonischemic cardiomyopathy ([place])   ? Sinus tachycardia   ? Sleep apnea   ? Snoring-prob OSA 09/04/2011  ? Sprint Fidelis ICD lead RECALL  [phone]   ? UARS (upper airway resistance syndrome) 09/04/2011  ? HST 12/2013:  AHI 4/hr (numerous episodes of airflow reduction that did not have concomitant desaturation)   ? ? ?Medications:  ?Infusions:  ? sodium chloride    ? sodium chloride    ? amiodarone 30 mg/hr (09/01/21 0500)  ? feeding supplement (VITAL 1.5 CAL) 50 mL/hr at 09/01/21 0500  ? heparin 50 units/mL (Impella PURGE) in  dextrose 5 % 1000 mL bag    ? heparin 300 Units/hr (09/01/21 0500)  ? milrinone 0.25 mcg/kg/min (09/01/21 0500)  ? ? ?Assessment: ?51 yo Clark s/p Impella 5.5 placement on 08/29/21. No PTA anticoagulation or indication for [facility] other than Impella.  ?-heparin purge at 12.9 mL/hr ? ?Heparin level is therapeutic at 0.26. Hemoglobin improved at 10.9. LDH up from 492 to 510 however platelets stable at 165. No s/x of bleeding per RN.  ? ?Goal of Therapy:  ?Heparin level 0.2-0.5 units/ml ?Monitor platelets by anticoagulation protocol: Yes ?  ?Plan:  ?-Continue systemic heparin at 300 units/hr  ?-Continue heparin through impella purge  ?-Heparin level q12h and CBC daily ?-Monitor for s/x bleeding ? ?Cathrine Muster, PharmD ?PGY2 Cardiology Pharmacy Resident ?Phone: [phone] ?09/01/2021  7:07 AM ? ?Please check [email or web address] for unit-specific pharmacy phone numbers. ? ? ? ?

## 2021-09-01 NOTE — Progress Notes (Signed)
Patient ID: [name], female   DOB: Dec 19, 1970, 51 y.o.   MRN: [id number]     Advanced Heart Failure Rounding Note  PCP-Cardiologist: [name], MD   Subjective:    3/6 Admitted w/ a/c systolic heart failure, NYHA Class IIIb-IV w/ low output, Started on empiric milrinone 0.25 + lasix gtt. Amiodarone gtt started for frequent NSVT. VAD W/u begun 3/6 Tx for hyperthyroidism. TSH undetectable. Free T4 5.11. Started on Methimazole + prednisone. Levothyroxine d/c  3/7 CT abdomen/pelvis showed left ovarian cystic mass, pelvic US showed complex cystic left ovarian mass.  Unable to get MRI with ICD.  3/8 RHC on 0.25 of Milrinone: RA 15, PCWP 37, PA Sat 57%, FICK CI 1.96, Thermo CI 1.12, PAPi 1.33, PVR 4. Milrinone increased to 0.375. Lasix gtt resumed  03/09 Impella 5.5 placed, milrinone decreased to 0.125, neo titrated off  Impella at P 8, Flow 4.8 L/min Waveforms ok. No alarms   Milrinone increased to 0.25 yesterday. Lasix gtt stopped. BP reportedly low this am and received 250cc NS per e-ICU   Feels ok. Denies CP or SOB. Weak. Feels like she needs to have BM   Coox 82%  No VT  On milrinone 0.25   Swan #s (shot personally) CVP 10 PA 50/34 (41) PCWP 23 CO 5.3 CI 2.6 PAPi 1.6 SVR 1013 LDH 367 -> 492 -> 510  RHC 3/8 (on Milrinone 0.25)   RA mean 15 RV 63/20 PA 64/44, mean 53 PCWP mean 37 PAPI 1.33   Oxygen saturations: PA 52% AO 100%  Cardiac Output (Fick) 3.97  Cardiac Index (Fick) 1.96  PVR 4 WU  Cardiac Output (thermo) 2.27 Cardiac Index (thermo) 1.12  1. Elevated right and left heart filling pressures.  2. 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  3. Low cardiac output, marked difference between Fick and thermodilution.  4. PAPI low at 1.33.   Objective:   Weight Range: 101.4 kg Body mass index is 39.6 kg/m.   Vital Signs:   Temp:  [97.5 F (36.4 C)-98.6 F (37 C)] 97.7 F (36.5 C) (03/12 1200) Pulse Rate:  [94-107] 98  (03/12 1200) Resp:  [10-25] 19 (03/12 1200) SpO2:  [95 %-100 %] 95 % (03/12 1200) Arterial Line BP: (73-100)/(55-81) 88/74 (03/12 1200) Last BM Date :  (PTA)  Weight change: Filed Weights   08/29/21 0600 08/29/21 1254 08/31/21 0456  Weight: 100.9 kg 100.7 kg 101.4 kg    Intake/Output:   Intake/Output Summary (Last 24 hours) at 09/01/2021 1215 Last data filed at 09/01/2021 1200 Gross per 24 hour  Intake 3694.52 ml  Output 2740 ml  Net 954.52 ml       Physical Exam    General:  Sitting in bed No resp difficulty HEENT: normal + cor-trak Neck: supple. RIJ swan Carotids 2+ bilat; no bruits. No lymphadenopathy or thryomegaly appreciated. Cor: Impella site ok  Regular rate & rhythm. No rubs, gallops or murmurs. + impella hum  Lungs: clear Abdomen: obese soft, nontender, nondistended. No hepatosplenomegaly. No bruits or masses. Good bowel sounds. Extremities: no cyanosis, clubbing, rash, trace edema Neuro: alert & orientedx3, cranial nerves grossly intact. moves all 4 extremities w/o difficulty. Affect pleasant    Telemetry   Sinus tach 95-105 ~ 5 PVCs/min Personally reviewed  Labs    CBC Recent Labs    08/31/21 0330 09/01/21 0331 09/01/21 0341  WBC 22.4* 23.0*  --   HGB 9.6* 9.1* 10.9*  HCT 31.5* 30.8* 32.0*  MCV 74.5* 77.4*  --  PLT 193 165  --     Basic Metabolic Panel Recent Labs    08/31/21 1641 09/01/21 0331 09/01/21 0341  NA 133* 131* 135  K 4.7 3.7 3.8  CL 94* 93*  --   CO2 29 30  --   GLUCOSE 120* 107*  --   BUN 28* 26*  --   CREATININE 1.08* 1.00  --   CALCIUM 9.4 8.7*  --   MG 2.3 2.3  --   PHOS 3.6 3.9  --     Liver Function Tests Recent Labs    09/01/21 0331  AST 76*  ALT 65*  ALKPHOS 104  BILITOT 1.0  PROT 5.7*  ALBUMIN 2.7*    No results for input(s): LIPASE, AMYLASE in the last 72 hours. Cardiac Enzymes No results for input(s): CKTOTAL, CKMB, CKMBINDEX, TROPONINI in the last 72 hours.  BNP: BNP (last 3 results) Recent  Labs    07/18/21 1647 08/21/21 1208 08/25/21 1728  BNP 1,331.6* 957.7* 1,117.0*     ProBNP (last 3 results) No results for input(s): PROBNP in the last 8760 hours.   D-Dimer No results for input(s): DDIMER in the last 72 hours.  Hemoglobin A1C No results for input(s): HGBA1C in the last 72 hours.  Fasting Lipid Panel No results for input(s): CHOL, HDL, LDLCALC, TRIG, CHOLHDL, LDLDIRECT in the last 72 hours.  Thyroid Function Tests Recent Labs    09/01/21 0331  TSH <0.010*     Other results:   Imaging    DG Chest Port 1 View  Result Date: 09/01/2021 CLINICAL DATA:  51 year old female with history of shortness of breath. Palpitations. EXAM: PORTABLE CHEST 1 VIEW COMPARISON:  Chest x-ray 08/30/2021. FINDINGS: Right internal jugular Cordis through which a Swan-Ganz catheter has been passed into the right main pulmonary artery. Right upper extremity catheter in position associated with Impella LVAD, tip of which projects over the expected location of the left ventricle, with the proximal aspect in the expected location of the ascending thoracic aorta. Left-sided pacemaker/AICD in position with lead tips projecting over the expected location of the right ventricular apex. There is a right upper extremity PICC with tip terminating in the mid superior vena cava. A feeding tube is seen extending into the abdomen, however, the tip of the feeding tube extends below the lower margin of the image. Lung volumes are low. No consolidative airspace disease. No pleural effusions. No pneumothorax. No evidence of pulmonary edema. Heart size is moderately enlarged. Upper mediastinal contours are within normal limits. IMPRESSION: 1. Support apparatus, as above. 2. Low lung volumes without radiographic evidence of acute cardiopulmonary disease. 3. Moderate cardiomegaly. Electronically Signed   By: Vinnie Langton M.D.   On: 09/01/2021 07:46     Medications:     Scheduled Medications:  (feeding  supplement) PROSource Plus  60 mL Oral TID BM   aspirin EC  81 mg Oral Daily   Chlorhexidine Gluconate Cloth  6 each Topical Daily   digoxin  0.0625 mg Oral Daily   feeding supplement (PROSource TF)  45 mL Per Tube Daily   insulin aspart  0-15 Units Subcutaneous TID WC   insulin aspart  0-5 Units Subcutaneous QHS   methimazole  10 mg Oral Daily   methimazole  5 mg Oral QPM   multivitamin with minerals  1 tablet Oral Daily   polyethylene glycol  17 g Oral Daily   predniSONE  20 mg Oral Q breakfast   quiniDINE gluconate  324 mg Oral  BID   rosuvastatin  40 mg Oral QHS   senna-docusate  2 tablet Oral BID   sodium chloride flush  10-40 mL Intracatheter Q12H   sodium chloride flush  3 mL Intravenous Q12H   sodium chloride flush  3 mL Intravenous Q12H   spironolactone  25 mg Oral Daily    Infusions:  sodium chloride     sodium chloride     sodium chloride 10 mL/hr at 09/01/21 1200   amiodarone 30 mg/hr (09/01/21 1200)   feeding supplement (VITAL 1.5 CAL) 50 mL/hr at 09/01/21 1200   heparin 50 units/mL (Impella PURGE) in dextrose 5 % 1000 mL bag 50 mL (09/01/21 0719)   heparin 300 Units/hr (09/01/21 1200)   milrinone 0.25 mcg/kg/min (09/01/21 1200)    PRN Medications: sodium chloride, sodium chloride, Place/Maintain arterial line **AND** sodium chloride, sodium chloride, acetaminophen, bisacodyl, ondansetron (ZOFRAN) IV, ondansetron (ZOFRAN) IV, sodium chloride flush, sodium chloride flush    Patient Profile   [name] is a 51 y.o. 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 Started on empiric milrinone and lasix gtt.   Assessment/Plan    1. Acute on chronic systolic HF: Nonischemic cardiomyopathy.  She has a Medtronic ICD. Cause for cardiomyopathy  is uncertain, initially identified peri-partum (after son's birth), so cannot rule out peri-partum CMP.  On and off, she has had frequent PVCs.   RHC in 8/15 showed normal filling pressures and preserved cardiac output. Echo in 4/22 with EF 25%, severe LV dilation.  CPX (8/22) with mild-moderate HF limitation.  Echo this admission with EF < 20% with severe dilation, normal RV size with mildly decreased systolic function, moderate-severe functional MR with dilated IVC. She was admitted with volume overload, NYHA class IV symptoms and low output. HCO3 18 and creatinine 1.5 on admit, co-ox 47%. Milrinone 0.25 started + lasix gtt.  I also suspect hyperthyroidism is playing a role in her CHF exacerbation. 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 Now on Impella 5.5 (placed 3/9) and milrinone 0.25, Swan numbers improved. Volume up slightly. PAPi remains low - Impella at P8. With 4.8 flow. Site looks good. LDH up slightly - CVP 10. Give lasix 60IV x 1 - Worry about RV. PAPi marginal - Hold Entresto for now. SBP 80s with narrow pulse pressure - Hold Coreg with marked volume overload and low output.     - Continue spironolactone 25 mg daily.   - Continue digoxin 0.0625 daily (lower dose).  Level was too high with digoxin 0.125 and concomitant quinidine in the past, level 0.2 today.  - Continue dapagliflozin 10 mg daily.  - ECG with LBBB, QRS 140 msec => with QRS < 150, Dr. [name] has not recommended CRT upgrade in the past.  - Needs advanced therapies (LVAD).  BMI too high currently for transplant (39.4), also with ovarian mass of uncertain etiology.  She is undergoing LVAD workup.  Underwent placement of Impella 5.5 to optimize for VAD on 03/09. Plan possible VAD on Tuesday 2. Pulmonary HTN: Mixed picture with PVR 8.1 on RHC at [facility].  However, repeat study in 8/15 here showed no significant pulmonary arterial  hypertension. 2017 RHC with pulmonary venous hypertension. [place]  this admit showed moderate mixed pulmonary venous/pulmonary arterial hypertension 3. Morbid obesity: BMI still appears out of range for heart transplant.  4. 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  She has been on Levoxyl at home but does not think she has taken for a month.  Now hyperthyroid.  This likely contributes to her CHF exacerbation. [facility] evaluated her for this. NSVT on milrinone makes it difficult for [name] to stop amiodarone.  - Started on prednisone 20 mg daily and methimazole bid.  - Will need outpatient endocrinology followup.  - As above, continuing amiodarone for now with NSVT.  5. PVCs: 11% PVCs + NSVT on 2/20 Zio patch.  Amiodarone started, but PVCs up to 13.3% with NSVT on 3/20 Zio patch. She saw Dr. [name] who thought that the ventricular arrhythmias were electromechanical due to worsening HF.  In 4/20, she had VT x 2 with ICD discharges in the setting of hypokalemia. Zio patch in 1/21 with PVCs down to 6.2% of beats. VT again in 2/22, quinidine started.  Initially w/ frequent NSVT on milrinone gtt. Started on amio gtt ectopy improved - EP consulted given complicated situation with hyperthyroidism and NSVT. They favor continuing amiodarone and quinidine at this time. No additional AAD therapy  6. CVA: CVA in 10/21, had thrombectomy with stent placement.  No LV thrombus noted and no atrial fibrillation noted.  - Talked with interventional radiology. >1 year post-stent, ok to stop ticagrelor and continue ASA.  7. Fe deficiency: She reports heavy periods.  Transferrin saturation low.  - Feraheme given 3/6, repeat today 8. CKD stage 3: Creatinine 1.5 on admit, 1.00 today. Follow with milrinone and diuresis.  9. Mitral regurgitation: Moderate-severe functional MR.  Unlikely to be Mitraclip candidate with severely dilated LV (>7 cm).  10. 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  Unable to get MRI with ICD. Seen by GYN. Felt to be most likely non-neoplastic. Tumor markers within normal range. Possible low grade malignancy, could also be benign.  No ascites noted on abdominal CT and no mention of abdominal spread.  She is not a surgical candidate at this time with low output HF.  - Plan at this point would be to stabilize with LVAD then investigate the ovarian mass.  - F/u with GYN as outpatient 11. Elevated WBCs: Suspect due to steroids, no evidence for infection.  12. Malnutrition: - Prealbumin 13 - Place CorTrak for tube feeds 13. Constipation: - Discussed bowel regimen with PharmD 14. Hypokalemia - supp K. Continue spiro   CRITICAL CARE Performed by: Glori Bickers  Total critical care time: 40 minutes  Critical care time was exclusive of separately billable procedures and treating other patients.  Critical care was necessary to treat or prevent imminent or life-threatening deterioration.  Critical care was time spent personally by me (independent of midlevel providers or residents) on the following activities: development of treatment plan with patient and/or surrogate as well as nursing, discussions with consultants, evaluation of patient's response to treatment, examination of patient, obtaining history from patient or surrogate, ordering and performing treatments and interventions, ordering and review of laboratory studies, ordering and review of radiographic studies, pulse oximetry and re-evaluation of patient's condition.   Length of Stay: [place], MD  09/01/2021, 12:15 PM  Advanced Heart Failure Team Pager [phone] (M-F; 7a - 5p)  Please contact [facility] for night-coverage after hours (5p -7a )  and weekends on [email or web address]

## 2021-09-02 ENCOUNTER — Inpatient Hospital Stay (HOSPITAL_COMMUNITY): Payer: BC Managed Care – PPO

## 2021-09-02 ENCOUNTER — Encounter (HOSPITAL_COMMUNITY): Payer: Self-pay | Admitting: Cardiovascular Disease

## 2021-09-02 DIAGNOSIS — Z95811 Presence of heart assist device: Secondary | ICD-10-CM

## 2021-09-02 DIAGNOSIS — R570 Cardiogenic shock: Secondary | ICD-10-CM | POA: Diagnosis not present

## 2021-09-02 DIAGNOSIS — I5021 Acute systolic (congestive) heart failure: Secondary | ICD-10-CM

## 2021-09-02 DIAGNOSIS — I428 Other cardiomyopathies: Secondary | ICD-10-CM | POA: Diagnosis not present

## 2021-09-02 DIAGNOSIS — Z515 Encounter for palliative care: Secondary | ICD-10-CM | POA: Diagnosis not present

## 2021-09-02 DIAGNOSIS — I509 Heart failure, unspecified: Secondary | ICD-10-CM | POA: Diagnosis not present

## 2021-09-02 LAB — POCT I-STAT 7, (LYTES, BLD GAS, ICA,H+H)
Acid-Base Excess: 9 mmol/L — ABNORMAL HIGH (ref 0.0–2.0)
Bicarbonate: 33.7 mmol/L — ABNORMAL HIGH (ref 20.0–28.0)
Calcium, Ion: 1.26 mmol/L (ref 1.15–1.40)
HCT: 34 % — ABNORMAL LOW (ref 36.0–46.0)
Hemoglobin: 11.6 g/dL — ABNORMAL LOW (ref 12.0–15.0)
O2 Saturation: 96 %
Patient temperature: 36.4
Potassium: 3.6 mmol/L (ref 3.5–5.1)
Sodium: 135 mmol/L (ref 135–145)
TCO2: 35 mmol/L — ABNORMAL HIGH (ref 22–32)
pCO2 arterial: 42.6 mmHg (ref 32–48)
pH, Arterial: 7.504 — ABNORMAL HIGH (ref 7.35–7.45)
pO2, Arterial: 71 mmHg — ABNORMAL LOW (ref 83–108)

## 2021-09-02 LAB — ECHOCARDIOGRAM LIMITED
Height: 63 in
Weight: 3569.69 oz

## 2021-09-02 LAB — BASIC METABOLIC PANEL
Anion gap: 8 (ref 5–15)
BUN: 25 mg/dL — ABNORMAL HIGH (ref 6–20)
CO2: 33 mmol/L — ABNORMAL HIGH (ref 22–32)
Calcium: 9.1 mg/dL (ref 8.9–10.3)
Chloride: 94 mmol/L — ABNORMAL LOW (ref 98–111)
Creatinine, Ser: 0.91 mg/dL (ref 0.44–1.00)
GFR, Estimated: >60 mL/min (ref >60)
Glucose, Bld: 101 mg/dL — ABNORMAL HIGH (ref 70–99)
Potassium: 3.7 mmol/L (ref 3.5–5.1)
Sodium: 135 mmol/L (ref 135–145)

## 2021-09-02 LAB — COOXEMETRY PANEL
Carboxyhemoglobin: 1.8 % — ABNORMAL HIGH (ref 0.5–1.5)
Methemoglobin: 1.1 % (ref 0.0–1.5)
O2 Saturation: 65.9 %
Total hemoglobin: 10.1 g/dL — ABNORMAL LOW (ref 12.0–16.0)

## 2021-09-02 LAB — CBC
HCT: 32.8 % — ABNORMAL LOW (ref 36.0–46.0)
Hemoglobin: 9.7 g/dL — ABNORMAL LOW (ref 12.0–15.0)
MCH: 23.4 pg — ABNORMAL LOW (ref 26.0–34.0)
MCHC: 29.6 g/dL — ABNORMAL LOW (ref 30.0–36.0)
MCV: 79 fL — ABNORMAL LOW (ref 80.0–100.0)
Platelets: 150 10*3/uL (ref 150–400)
RBC: 4.15 MIL/uL (ref 3.87–5.11)
RDW: 21.8 % — ABNORMAL HIGH (ref 11.5–15.5)
WBC: 24.5 10*3/uL — ABNORMAL HIGH (ref 4.0–10.5)
nRBC: 4.3 % — ABNORMAL HIGH (ref 0.0–0.2)

## 2021-09-02 LAB — MAGNESIUM: Magnesium: 2.3 mg/dL (ref 1.7–2.4)

## 2021-09-02 LAB — HEPARIN LEVEL (UNFRACTIONATED)
Heparin Unfractionated: 0.21 IU/mL — ABNORMAL LOW (ref 0.30–0.70)
Heparin Unfractionated: 0.27 IU/mL — ABNORMAL LOW (ref 0.30–0.70)

## 2021-09-02 LAB — PREPARE RBC (CROSSMATCH)

## 2021-09-02 LAB — GLUCOSE, CAPILLARY
Glucose-Capillary: 107 mg/dL — ABNORMAL HIGH (ref 70–99)
Glucose-Capillary: 109 mg/dL — ABNORMAL HIGH (ref 70–99)
Glucose-Capillary: 115 mg/dL — ABNORMAL HIGH (ref 70–99)
Glucose-Capillary: 117 mg/dL — ABNORMAL HIGH (ref 70–99)
Glucose-Capillary: 106 mg/dL — ABNORMAL HIGH (ref 70–99)

## 2021-09-02 LAB — FACTOR 5 LEIDEN

## 2021-09-02 LAB — PHOSPHORUS: Phosphorus: 4.6 mg/dL (ref 2.5–4.6)

## 2021-09-02 LAB — LACTATE DEHYDROGENASE: LDH: 570 U/L — ABNORMAL HIGH (ref 98–192)

## 2021-09-02 LAB — T3, FREE: T3, Free: 3.6 pg/mL (ref 2.0–4.4)

## 2021-09-02 MED ORDER — VANCOMYCIN HCL 1 G IV SOLR
1000.0000 mg | INTRAVENOUS | Status: DC
  Filled 2021-09-02 (×2): qty 20

## 2021-09-02 MED ORDER — CEFAZOLIN SODIUM-DEXTROSE 2-4 GM/100ML-% IV SOLN
2.0000 g | INTRAVENOUS | Status: DC
Start: 2021-09-03 — End: 2021-09-03
  Filled 2021-09-02: qty 100

## 2021-09-02 MED ORDER — MILRINONE LACTATE IN DEXTROSE 20-5 MG/100ML-% IV SOLN
0.3000 ug/kg/min | INTRAVENOUS | Status: DC
  Filled 2021-09-02: qty 100

## 2021-09-02 MED ORDER — SODIUM CHLORIDE 0.9 % IV SOLN
600.0000 mg | INTRAVENOUS | Status: AC
  Administered 2021-09-03: 600 mg via INTRAVENOUS
  Filled 2021-09-02: qty 10

## 2021-09-02 MED ORDER — SODIUM CHLORIDE 0.9 % IV SOLN: INTRAVENOUS | Status: DC

## 2021-09-02 MED ORDER — DEXMEDETOMIDINE HCL IN NACL 400 MCG/100ML IV SOLN
0.1000 ug/kg/h | INTRAVENOUS | Status: AC
  Administered 2021-09-03: .7 ug/kg/h via INTRAVENOUS
  Filled 2021-09-02: qty 100

## 2021-09-02 MED ORDER — DOBUTAMINE IN D5W 4-5 MG/ML-% IV SOLN
2.0000 ug/kg/min | INTRAVENOUS | Status: DC
  Filled 2021-09-02: qty 250

## 2021-09-02 MED ORDER — INSULIN REGULAR(HUMAN) IN NACL 100-0.9 UT/100ML-% IV SOLN
INTRAVENOUS | Status: AC
Start: 2021-09-03 — End: 2021-09-03
  Administered 2021-09-03: 1.3 [IU]/h via INTRAVENOUS
  Filled 2021-09-02: qty 100

## 2021-09-02 MED ORDER — PHENYLEPHRINE HCL-NACL 20-0.9 MG/250ML-% IV SOLN
0.0000 ug/min | INTRAVENOUS | Status: DC
  Filled 2021-09-02: qty 250

## 2021-09-02 MED ORDER — HEPARIN 30,000 UNITS/1000 ML (OHS) CELLSAVER SOLUTION
Status: DC
  Filled 2021-09-02: qty 1000

## 2021-09-02 MED ORDER — DOPAMINE-DEXTROSE 3.2-5 MG/ML-% IV SOLN
0.0000 ug/kg/min | INTRAVENOUS | Status: DC
  Filled 2021-09-02: qty 250

## 2021-09-02 MED ORDER — TEMAZEPAM 7.5 MG PO CAPS: 15.0000 mg | ORAL_CAPSULE | Freq: Once | ORAL | Status: DC | PRN

## 2021-09-02 MED ORDER — TRANEXAMIC ACID (OHS) BOLUS VIA INFUSION
15.0000 mg/kg | INTRAVENOUS | Status: AC
  Administered 2021-09-03: 1518 mg via INTRAVENOUS
  Filled 2021-09-02: qty 1518

## 2021-09-02 MED ORDER — VASOPRESSIN 20 UNITS/100 ML INFUSION FOR SHOCK
0.0400 [IU]/min | INTRAVENOUS | Status: AC
Start: 2021-09-03 — End: 2021-09-03
  Administered 2021-09-03: .03 [IU]/min via INTRAVENOUS
  Filled 2021-09-02: qty 100

## 2021-09-02 MED ORDER — METHYLPREDNISOLONE SODIUM SUCC 40 MG IJ SOLR
20.0000 mg | Freq: Every day | INTRAMUSCULAR | Status: DC
  Administered 2021-09-02: 20 mg via INTRAVENOUS
  Filled 2021-09-02: qty 1

## 2021-09-02 MED ORDER — METHYLPREDNISOLONE SODIUM SUCC 125 MG IJ SOLR
80.0000 mg | Freq: Once | INTRAMUSCULAR | Status: AC
  Administered 2021-09-03: 80 mg via INTRAVENOUS
  Filled 2021-09-02: qty 2

## 2021-09-02 MED ORDER — CHLORHEXIDINE GLUCONATE CLOTH 2 % EX PADS
6.0000 | MEDICATED_PAD | Freq: Once | CUTANEOUS | Status: AC
  Administered 2021-09-02: 6 via TOPICAL

## 2021-09-02 MED ORDER — FUROSEMIDE 10 MG/ML IJ SOLN
60.0000 mg | Freq: Once | INTRAMUSCULAR | Status: AC
  Administered 2021-09-02: 60 mg via INTRAVENOUS
  Filled 2021-09-02: qty 6

## 2021-09-02 MED ORDER — EPINEPHRINE HCL 5 MG/250ML IV SOLN IN NS
0.0000 ug/min | INTRAVENOUS | Status: AC
  Administered 2021-09-03: 2 ug/min via INTRAVENOUS
  Filled 2021-09-02: qty 250

## 2021-09-02 MED ORDER — FLUCONAZOLE IN SODIUM CHLORIDE 400-0.9 MG/200ML-% IV SOLN
400.0000 mg | INTRAVENOUS | Status: AC
  Administered 2021-09-03: 400 mg via INTRAVENOUS
  Filled 2021-09-02: qty 200

## 2021-09-02 MED ORDER — MAGNESIUM SULFATE 50 % IJ SOLN
40.0000 meq | INTRAMUSCULAR | Status: DC
Start: 2021-09-03 — End: 2021-09-03
  Filled 2021-09-02: qty 9.85

## 2021-09-02 MED ORDER — POTASSIUM CHLORIDE 10 MEQ/50ML IV SOLN
10.0000 meq | INTRAVENOUS | Status: AC
  Administered 2021-09-02 (×4): 10 meq via INTRAVENOUS
  Filled 2021-09-02 (×4): qty 50

## 2021-09-02 MED ORDER — NITROGLYCERIN IN D5W 200-5 MCG/ML-% IV SOLN
0.0000 ug/min | INTRAVENOUS | Status: DC
  Filled 2021-09-02: qty 250

## 2021-09-02 MED ORDER — VANCOMYCIN HCL 1500 MG/300ML IV SOLN
1500.0000 mg | INTRAVENOUS | Status: AC
  Administered 2021-09-03: 1500 mg via INTRAVENOUS
  Filled 2021-09-02: qty 300

## 2021-09-02 MED ORDER — SODIUM CHLORIDE 0.9% IV SOLUTION: INTRAVENOUS | Status: DC

## 2021-09-02 MED ORDER — NOREPINEPHRINE 4 MG/250ML-% IV SOLN
0.0000 ug/min | INTRAVENOUS | Status: AC
  Administered 2021-09-03: 1 ug/min via INTRAVENOUS
  Filled 2021-09-02: qty 250

## 2021-09-02 MED ORDER — MUPIROCIN 2 % EX OINT
1.0000 "application " | TOPICAL_OINTMENT | Freq: Two times a day (BID) | CUTANEOUS | Status: AC
  Administered 2021-09-02 (×2): 1 via NASAL
  Filled 2021-09-02: qty 22

## 2021-09-02 MED ORDER — CHLORHEXIDINE GLUCONATE 0.12 % MT SOLN
15.0000 mL | Freq: Once | OROMUCOSAL | Status: AC
  Administered 2021-09-03: 15 mL via OROMUCOSAL

## 2021-09-02 MED ORDER — DIAZEPAM 2 MG PO TABS
2.0000 mg | ORAL_TABLET | Freq: Once | ORAL | Status: AC
  Administered 2021-09-03: 2 mg via ORAL
  Filled 2021-09-02: qty 1

## 2021-09-02 MED ORDER — TRANEXAMIC ACID (OHS) PUMP PRIME SOLUTION
2.0000 mg/kg | INTRAVENOUS | Status: DC
  Filled 2021-09-02: qty 2.02

## 2021-09-02 MED ORDER — POTASSIUM CHLORIDE 2 MEQ/ML IV SOLN
80.0000 meq | INTRAVENOUS | Status: DC
Start: 2021-09-03 — End: 2021-09-03
  Filled 2021-09-02: qty 40

## 2021-09-02 MED ORDER — CEFAZOLIN SODIUM-DEXTROSE 2-4 GM/100ML-% IV SOLN
2.0000 g | INTRAVENOUS | Status: AC
  Administered 2021-09-03 (×2): 2 g via INTRAVENOUS
  Filled 2021-09-02: qty 100

## 2021-09-02 MED ORDER — BISACODYL 5 MG PO TBEC
5.0000 mg | DELAYED_RELEASE_TABLET | Freq: Once | ORAL | Status: AC
  Administered 2021-09-02: 5 mg via ORAL
  Filled 2021-09-02: qty 1

## 2021-09-02 MED ORDER — SODIUM CHLORIDE 0.9 % IV SOLN
1.5000 mg/kg/h | INTRAVENOUS | Status: AC
  Administered 2021-09-03: 1.5 mg/kg/h via INTRAVENOUS
  Filled 2021-09-02: qty 25

## 2021-09-02 MED ORDER — CHLORHEXIDINE GLUCONATE CLOTH 2 % EX PADS
6.0000 | MEDICATED_PAD | Freq: Once | CUTANEOUS | Status: AC
  Administered 2021-09-03: 6 via TOPICAL

## 2021-09-02 NOTE — Progress Notes (Addendum)
ANTICOAGULATION CONSULT NOTE ? ?Pharmacy Consult for heparin ?Indication:  impella 5.5 ? ?No Known Allergies ? ?Patient Measurements: ?Height: '5\' 3"'$  (160 cm) ?Weight: 101.2 kg (223 lb 1.7 oz) ?IBW/kg (Calculated) : 52.4 ?Heparin Dosing Weight: 76.1 kg ? ?Vital Signs: ?Temp: 97.9 ?F (36.6 ?C) (03/13 0700) ?Temp Source: Core (03/13 0700) ?Pulse Rate: 93 (03/13 0700) ? ?Labs: ?Recent Labs  ?  08/31/21 ?0330 08/31/21 ?1223 08/31/21 ?1641 09/01/21 ?7564 09/01/21 ?3329 09/01/21 ?1637 09/02/21 ?5188 09/02/21 ?4166  ?HGB 9.6*  --   --  9.1* 10.9*  --  9.7* 11.6*  ?HCT 31.5*  --   --  30.8* 32.0*  --  32.8* 34.0*  ?PLT 193  --   --  165  --   --  150  --   ?LABPROT  --   --   --  15.2  --   --   --   --   ?INR  --   --   --  1.2  --   --   --   --   ?HEPARINUNFRC 0.15*   < > 0.30 0.26*  --  0.29* 0.21*  --   ?CREATININE 1.10*  --  1.08* 1.00  --   --  0.91  --   ? < > = values in this interval not displayed.  ? ? ? ?Estimated Creatinine Clearance: 83.9 mL/min (by C-G formula based on SCr of 0.91 mg/dL). ? ? ?Medical History: ?Past Medical History:  ?Diagnosis Date  ? Automatic implantable cardioverter-defibrillator in situ   ? Chronic CHF (congestive heart failure) (HCC)   ? a. EF 15-20% b. RHC (09/2013) RA 14, RV 57/22, PA 64/36 (48), PCWP 18, FIck CO/CI 3.7/1.6, PVR 8.1 WU, PA sat 47%   ? History of stomach ulcers   ? Hypertension   ? Hypotension   ? Hypothyroidism   ? Morbid obesity ([place])   ? Myocardial infarction [facility]) 08/2013  ? Nocturnal dyspnea   ? Nonischemic cardiomyopathy ([place])   ? Sinus tachycardia   ? Sleep apnea   ? Snoring-prob OSA 09/04/2011  ? [employer] ICD lead RECALL  [phone]   ? UARS (upper airway resistance syndrome) 09/04/2011  ? HST 12/2013:  AHI 4/hr (numerous episodes of airflow reduction that did not have concomitant desaturation)   ? ? ?Medications:  ?Infusions:  ? sodium chloride    ? sodium chloride    ? sodium chloride 10 mL/hr at 09/02/21 0700  ? amiodarone 30 mg/hr (09/02/21 0700)  ? feeding  supplement (VITAL 1.5 CAL) 50 mL/hr at 09/02/21 0600  ? heparin 50 units/mL (Impella PURGE) in dextrose 5 % 1000 mL bag 50 mL (09/01/21 0719)  ? heparin 300 Units/hr (09/02/21 0700)  ? milrinone 0.25 mcg/kg/min (09/02/21 0700)  ? potassium chloride    ? ? ?Assessment: ?51 yo female s/p Impella 5.5 placement on 08/29/21. No PTA anticoagulation or indication for [facility] other than Impella.  ?-heparin purge at 12.9 mL/hr ? ?Heparin level is therapeutic at 0.21, on heparin 300 units/hr systemic + purge solution (purge flow at 11.1 mL/hr - 555 units/hr). Hgb 11.6, plt 570. No s/sx of bleeding or infusion issues. ? ?Goal of Therapy:  ?Heparin level 0.2-0.5 units/ml ?Monitor platelets by anticoagulation protocol: Yes ?  ?Plan:  ?-Continue systemic heparin at 300 units/hr  ?-Continue heparin through impella purge  ?-Heparin level q12h and CBC daily ?-Monitor for s/x bleeding ? ?Antonietta Jewel, PharmD, BCCCP ?Clinical Pharmacist  ?Phone: [phone] ?09/02/2021 8:01 AM ? ?Please  check AMION for all [place] phone numbers ?After 10:00 PM, call [place] [phone] ? ? ?ADDENDUM ?Heparin level this evening came back at 0.27, on 300 units/hr systemic heparin. Purge solution running at 11 (550 units/hr) - pressures stable in 400-500s. No s/sx of bleeding or infusion issues. Continue systemic at same rate and monitor levels with AM labs. Plan for LVAD placement 3/14- systemic heparin to stop on 3/14'@0400'$ . ? ?Antonietta Jewel, PharmD, BCCCP ?Clinical Pharmacist  ? ? ?

## 2021-09-02 NOTE — Progress Notes (Signed)
Med[employer] rep Leanna Sato aware of need to disable ICD therapies tomorrow morning 09/03/21 prior to 0745 OR case for VAD implant.  ? ?[name] RN ?VAD Coordinator  ?Office: [phone]  ?24/7 Pager: [phone]  ? ?

## 2021-09-02 NOTE — Progress Notes (Signed)
No ICM remote transmission received for 09/02/2021 due to hospitalization and next ICM transmission scheduled for 09/17/2021.   ?

## 2021-09-02 NOTE — Progress Notes (Signed)
4 Days Post-Op Procedure(s) (LRB): ?PLACEMENT OF IMPELLA 5.5 LEFT VENTRICULAR ASSIST DEVICE (Right) ?TRANSESOPHAGEAL ECHOCARDIOGRAM (TEE) (N/A) ?Subjective: ?Patient examined, images of most recent echo cardiogram, chest CT scan and chest x-ray personally reviewed.  Very nice 51 year old female with nonischemic cardiomyopathy recently readmitted for acute on chronic systolic heart failure and cardiogenic shock. ? ?Patient has been stable and improving the past 4 days after placement of Impella 5.5 percutaneous VAD for acute on chronic systolic heart failure and cardiogenic shock. ?She has been carefully evaluated by the advanced heart failure team for implantable mechanical support with HeartMate 3.  She has been found to be an appropriate candidate for HeartMate 3 therapy with indications to improve survival and quality of life. ? ?I have discussed the procedure of HeartMate 3 implantation with the patient and her husband including the expected use of cardiopulmonary bypass.  They understand we will remove the Impella 5.5 VAD once the implantable VAD has been placed. ?The patient understands the benefits of this procedure to include improve survival and improve quality of life and to prevent recurrent episodes of heart failure.  She understands the potential risks of stroke, bleeding, blood transfusion, organ failure, infection, RV failure requiring right VAD and death. ? ?Objective: ?Vital signs in last 24 hours: ?Temp:  [97.7 ?F (36.5 ?C)-98.4 ?F (36.9 ?C)] 97.7 ?F (36.5 ?C) (03/13 1300) ?Pulse Rate:  [89-102] 92 (03/13 1300) ?Cardiac Rhythm: Normal sinus rhythm (03/13 1200) ?Resp:  [0-23] 13 (03/13 1300) ?SpO2:  [94 %-99 %] 98 % (03/13 1300) ?Arterial Line BP: (78-99)/(61-85) 78/61 (03/13 1300) ?Weight:  [101.2 kg] 101.2 kg (03/13 0500) ? ?Hemodynamic parameters for last 24 hours: ?PAP: (21-52)/(17-43) 23/18 ?CVP:  [0 mmHg-8 mmHg] 4 mmHg ?CO:  [4.1 L/min-4.9 L/min] 4.7 L/min ?CI:  [2 L/min/m2-2.4 L/min/m2] 2.3  L/min/m2 ? ?Intake/Output from previous day: ?03/12 0701 - 03/13 0700 ?In: 2737.5 [P.O.:360; I.V.:850.9; NG/GT:1150; IV Piggyback:146.4] ?Out: 3206 [Urine:3205; Stool:1] ?Intake/Output this shift: ?Total I/O ?In: 911.4 [P.O.:240; I.V.:187.8; Other:67.6; NG/GT:300; IV Piggyback:116] ?Out: 2725 [Urine:2725] ? ?Exam ? ?Blood pressure (!) 87/45, pulse 92, temperature 97.7 ?F (36.5 ?C), resp. rate 13, height '5\' 3"'$  (1.6 m), weight 101.2 kg, SpO2 98 %. ? ?Alert and comfortable at bedrest with Impella 5.5 in place ?Neck without tenderness swelling or JVD ?Lungs clear.  Incision for Impella 5.5 without hematoma and intact with sterile dressing ?Abdomen nontender nondistended ?Neuro without focal deficit ?Extremities warm with minimal edema ? ?Lab Results: ?Recent Labs  ?  09/01/21 ?0331 09/01/21 ?0341 09/02/21 ?0311 09/02/21 ?0619  ?WBC 23.0*  --  24.5*  --   ?HGB 9.1*   < > 9.7* 11.6*  ?HCT 30.8*   < > 32.8* 34.0*  ?PLT 165  --  150  --   ? < > = values in this interval not displayed.  ? ?BMET:  ?Recent Labs  ?  09/01/21 ?0331 09/01/21 ?0341 09/02/21 ?0311 09/02/21 ?0619  ?NA 131*   < > 135 135  ?K 3.7   < > 3.7 3.6  ?CL 93*  --  94*  --   ?CO2 30  --  33*  --   ?GLUCOSE 107*  --  101*  --   ?BUN 26*  --  25*  --   ?CREATININE 1.00  --  0.91  --   ?CALCIUM 8.7*  --  9.1  --   ? < > = values in this interval not displayed.  ?  ?PT/INR:  ?Recent Labs  ?  09/01/21 ?0331  ?  LABPROT 15.2  ?INR 1.2  ? ?ABG ?   ?Component Value Date/Time  ? PHART 7.504 (H) 09/02/2021 3646  ? HCO3 33.7 (H) 09/02/2021 8032  ? TCO2 35 (H) 09/02/2021 1224  ? ACIDBASEDEF 1.0 08/25/2021 2250  ? O2SAT 96 09/02/2021 0619  ? ?CBG (last 3)  ?Recent Labs  ?  09/02/21 ?0311 09/02/21 ?0609 09/02/21 ?1214  ?GLUCAP 107* 109* 115*  ? ? ?Assessment/Plan: ?S/P Procedure(s) (LRB): ?PLACEMENT OF IMPELLA 5.5 LEFT VENTRICULAR ASSIST DEVICE (Right) ?TRANSESOPHAGEAL ECHOCARDIOGRAM (TEE) (N/A) ?Nonischemic cardiomyopathy with acute on chronic systolic heart failure,  improved with Impella 5.5 therapy ?Leukocytosis without fever and negative blood cultures and negative urinalysis felt to be secondary to steroid therapy for her hyperthyroidism ?History of stroke on antiplatelet therapy with Brilinta, now washed out since admission ? ?Plan implantation of HeartMate 3  in AM March 14. ? LOS: 7 days  ? ? ?[name] ?09/02/2021 ? ? ?

## 2021-09-02 NOTE — Addendum Note (Signed)
Addendum  created 09/02/21 1114 by Josephine Igo, CRNA  ? Charge Capture section accepted, Visit diagnoses modified  ?  ?

## 2021-09-02 NOTE — Progress Notes (Signed)
Nutrition Follow-up ? ?DOCUMENTATION CODES:  ? ?Obesity unspecified, Non-severe (moderate) malnutrition in context of chronic illness ? ?INTERVENTION:  ? ?Tube Feeding via Cortrak: continue and plan to reassess post-op ?Vital 1.5 at 50 ml/hr ?Pro-Source TF 45 mL daily ?This provides 1840 kcals, 92 g of protein and 917 mL of free water ? ?Encourage po intake post op ? ?Continue MVI with minerals ? ?Continue ProSource Plus po 60 ml TID, each supplement provides 100 kcal and 15 grams of protein (total: 600 kcal, 90 grams of protein) ? ? ?NUTRITION DIAGNOSIS:  ? ?Moderate Malnutrition related to chronic illness as evidenced by moderate muscle depletion, mild fat depletion, energy intake < 75% for > or equal to 1 month. ? ?Being addressed via supplements, supplemental TF ? ?GOAL:  ? ?Patient will meet greater than or equal to 90% of their needs ? ?Progressing ? ?MONITOR:  ? ?PO intake, Supplement acceptance, Labs, Weight trends ? ?REASON FOR ASSESSMENT:  ? ?Consult ?LVAD Eval ? ?ASSESSMENT:  ? ?51 yo female admitted with acute on chronic CHF. PMH includes NICM EF 20-25%, CKD 3, CVA, HTN, HLD ?  ?3/08 RHC: low cardiac output with elevated right/left filling pressure ?03/09 s/p Impella 5.5 placement ?03/10 Cortrak placed (tip in distal stomach) ? ?LVAD scheduled for tomorrow ? ?Appetite remains poor.  ? ?Tolerating Vital 1.5 at 50 ml/hr via Cortrak ?Plan to continue continuous TF regimen for now given plan to go to OR tomorrow, TF to be held at midnight ? ?+BM ? ?UOP 3L in 24 hours; Net negative 11 L in 24 hours ?Current wt 101.2 kg ? ?Labs: reviewed; phosphorus 4.6 (wdl), potassium 3.6 (wdl), magnesium 2.3 (wdl) ?Meds: ss novolog, MVI, solumedrol,  ? ? ?Diet Order:   ?Diet Order   ? ?       ?  Diet regular Room service appropriate? Yes; Fluid consistency: Thin; Fluid restriction: 2000 mL Fluid  Diet effective now       ?  ? ?  ?  ? ?  ? ? ?EDUCATION NEEDS:  ? ?Education needs have been addressed ? ?Skin:  Skin Assessment:  Reviewed RN Assessment (closed incision to chest) ? ?Last BM:  no documented BM ? ?Height:  ? ?Ht Readings from Last 1 Encounters:  ?08/29/21 '5\' 3"'$  (1.6 m)  ? ? ?Weight:  ? ?Wt Readings from Last 1 Encounters:  ?09/02/21 101.2 kg  ? ? ? ?BMI:  Body mass index is 39.52 kg/m?. ? ?Estimated Nutritional Needs:  ? ?Kcal:  1800-2000 kcals ? ?Protein:  100-115 g ? ?Fluid:  2L ? ? ? ?Kerman Passey MS, RDN, LDN, CNSC ?Registered Dietitian III ?Clinical Nutrition ?RD Pager and On-Call Pager Number Located in [place]  ? ?

## 2021-09-02 NOTE — Progress Notes (Addendum)
VAD coordinator rounding note: ? ?Met with pt and husband [name] today at bedside. Dr Prescott Gum also at bedside reviewing plan for surgery tomorrow. See his note for further details of their conversation. Both pt and husband verbalized consent to surgery, and deny any questions at this time.   ? ?Impella at P 8, Flow 4.4 L/min. Heparin in purge solution. No alarms noted. No pain at impella. Reports slight weakness/intermittent numbness of the fingers of her right hand. She states this is a chronic issue as a result of her previous stroke.  ?  ?Continues on milrinone 0.25 mcg/kg/min. Coox 66% No dyspnea.  ? ?CoreTrack in place- infusing tube feed at 50 ml/hr. Tolerating tube feeds well. Still with poor appetite. ? ?Stressed importance of using  her IS 10x an hour. She verbalized understanding. ? ?Pt aware that her insurance does not require a PA for VAD implant. Pt was informed that Judson Roch (VAD coordinator) spoke with Francoise Ceo from [place] and confirmed eligibility and benefits for VAD implant to be covered under hospitalization.  ? ?We will discuss pt at our [facility] meeting this afternoon and if everyone is in agreement for VAD implant will proceed on Tuesday w/Dr Prescott Gum. Pt and husband [name] aware and in agreement.  ? ?[name] will plan to be at the hospital tomorrow during surgery. Made aware that VAD coordinator will call periodically with updates. He verbalized understanding and appreciation. Best contact number for him is [phone].  ? ?Pre- Intermacs follow up completed including:  Quality of Life, KCCQ-12, and Neurocognitive trail making. Pt completed 400 feet during 6 minute walk on 08/29/21. See criteria note for KCCQ-12 data.  ? ?All questions have been answered at this time and contact information was provided should they encounter any further questions. ? ?[name] RN ?VAD Coordinator  ?Office: [phone]  ?24/7 Pager: [phone]  ? ? ?

## 2021-09-02 NOTE — Progress Notes (Signed)
[place] has been discussed with the VAD Medical Review board on 09/02/21. The team feels as if the patient is a good candidate for Destination LVAD therapy. The patient meets criteria for a LVAD implant as listed below: ? ?1) NYHA Class: _IV_ documented on _3/13/23_(date) ? ?2) Has a left ventricular ejection fraction (LVEF) < 25% ? ? *EF__<20%__ by echo (date) _3/13/23____ ? ?3) Must meet one of the following: ? ? Is inotrope dependent ? ? *On inotropes__Milrinone_started__3/6/23______ ? ?OR ? ?Has a Cardiac Index (CI) < 2.2  L/min/m2 while not on inotropes: ? ? *CI:______ ? ? ?4) Must meet one of the following: ? ? ___X___ Is on optimal medical management (OMM), based on current heart failure practice guidelines for at least 45 of the last 60 days and are failing to respond ? ? ___X___ Has advanced heart failure for at least 14 days and are dependent on an intra-aortic balloon pump (IABP) or similar temporary mechanical circulatory support for at least 7 days ?  ?   ____ IABP inserted (date) ____ ?  ?   __X__ Impella inserted (date) _3/9/23___ ? ?5)  Social work and palliative care evaluations demonstrate appropriate support system in place for discharge to home with a VAD and that end of life discussions have taken place. Both services have expressed no concern regarding patient's candidacy.  ? ?      *Social work consult (date): 08/28/21 ? ?      *Palliative Care Consult (date): 08/27/21 ? ?6)  Primary caretaker identified that can be taught along with the patient how to manage the VAD equipment. ? ?      *Name: Kymberlee Viger (husband) ? ?7)  Deemed appropriate by our financial coordinator: Cecilio Asper ? ?      Prior approval: Tanda Rockers RN VAD coordinator spoke with Francoise Ceo from [place] and confirmed eligibility and benefits for VAD implant to be covered under hospitalization. No prior authorization required.  ? ?8)  VAD Coordinator, Tanda Rockers RN has met with patient and caregiver, shown them the VAD equipment and  discussed with the patient and caregiver about lifestyle changes necessary for success on mechanical circulatory device. ? ?      *Met with Khanh and [name] on 08/26/21. ?      *Consent for VAD Evaluation/Caregiver Agreement/HIPPA Release/Photo Release signed on 08/26/21 ? ? ?9)  Six Minute Walk:  400 ft ? ? ?10) KCCQ: Pre VAD: ? ?[facility] Cardiomyopathy Questionnaire  ?KCCQ-12    ?1 a. Ability to shower/bathe Moderately limited   ?1 b. Ability to walk 1 block Extremely limited   ?1 c. Ability to hurry/jog Extremely limited   ?2. Edema feet/ankles/legs Less than once a week   ?3. Limited by fatigue Several times per day   ?4. Limited by dyspnea All of the time   ?5. Sitting up / on 3+ pillows Every night   ?6. Limited enjoyment of life Extremely limited   ?7. Rest of life w/ symptoms Not at all satisfied   ?8 a. Participation in hobbies Severely limited   ?8 b. Participation in chores Severely limited   ?8 c. Visiting family/friends Severely limited    ? ? ?11)  Intermacs profile: 2 ? ?INTERMACS? 1: Critical cardiogenic shock describes a patient who is ?crashing and burning?, in which a patient has life-threatening hypotension and rapidly escalating inotropic pressor support, with critical organ hypoperfusion often confirmed by worsening acidosis and lactate levels. ? ?INTERMACS? 2: Progressive decline describes  a patient who has been demonstrated ?dependent? on inotropic support but nonetheless shows signs of continuing deterioration in nutrition, renal function, fluid retention, or other major status indicator. Patient profile 2 can also describe a patient with refractory volume overload, perhaps with evidence of impaired perfusion, in whom inotropic infusions cannot be maintained due to tachyarrhythmias, clinical ischemia, or other intolerance. ? ?INTERMACS? 3: Stable but inotrope dependent describes a patient who is clinically stable on mild-moderate doses of intravenous inotropes (or has a temporary circulatory  support device) after repeated documentation of failure to wean without symptomatic hypotension, worsening symptoms, or progressive organ dysfunction (usually renal). It is critical to monitor nutrition, renal function, fluid balance, and overall status carefully in order to distinguish between a   ?patient who is truly stable at Patient Profile 3 and a patient who has unappreciated decline rendering them Patient Profile 2. This patient may be either at home or in the hospital.   ?   ?INTERMACS? 4: Resting symptoms describes a patient who is at home on oral therapy but frequently has symptoms of congestion at rest or with activities of daily living (ADL). He or she may have orthopnea, shortness of breath during ADL such as dressing or bathing, gastrointestinal symptoms (abdominal discomfort, nausea, poor appetite), disabling ascites or severe lower extremity edema. This patient should be carefully considered for more intensive management and surveillance programs, which may in some cases, reveal poor compliance that would compromise outcomes with any therapy.   ?.   ?INTERMACS? 5: Exertion Intolerant describes a patient who is comfortable at rest but unable to engage in any activity, living predominantly within the house or housebound. This patient has no congestive symptoms, but may have chronically elevated volume status, frequently with renal dysfunction, and may be characterized as exercise intolerant.   ?   ?INTERMACS? 6: Exertion Limited also describes a patient who is comfortable at rest without evidence of fluid overload, but who is able to do some mild activity. Activities of daily living are comfortable and minor activities outside the home such as visiting friends or going to a restaurant can be performed, but fatigue results within a few minutes of any meaningful physical exertion. This patient has occasional episodes of worsening symptoms and is likely to have had a hospitalization for heart failure  within the past year.  ? ?INTERMACS? 7: Advanced NYHA Class 3 describes a patient who is clinically stable with a reasonable level of comfortable activity, despite history of previous decompensation that is not recent. This patient is usually able to walk more than a block. Any decompensation requiring intravenous diuretics or hospitalization within the previous month should make this person a Patient Profile 6 or lower.   ? ? ?[name] RN ?VAD Coordinator  ?Office: [phone]  ?24/7 Pager: [phone]  ? ?

## 2021-09-02 NOTE — Progress Notes (Addendum)
Patient ID: [name], female   DOB: [birth year], 51 y.o.   MRN: [id number]     Advanced Heart Failure Rounding Note  PCP-Cardiologist: [name], MD   Subjective:    3/6 Admitted w/ a/c systolic heart failure, NYHA Class IIIb-IV w/ low output, Started on empiric milrinone 0.25 + lasix gtt. Amiodarone gtt started for frequent NSVT. VAD W/u begun 3/6 Tx for hyperthyroidism. TSH undetectable. Free T4 5.11. Started on Methimazole + prednisone. Levothyroxine d/c  3/7 CT abdomen/pelvis showed left ovarian cystic mass, pelvic US showed complex cystic left ovarian mass.  Unable to get MRI with ICD.  3/8 RHC on 0.25 of Milrinone: RA 15, PCWP 37, PA Sat 57%, FICK CI 1.96, Thermo CI 1.12, PAPi 1.33, PVR 4. Milrinone increased to 0.375. Lasix gtt resumed  03/09 Impella 5.5 placed, milrinone decreased to 0.125, neo titrated off  Impella at P 8, Flow 4.3 L/min Waveforms ok. No alarms.  Heparin ongoing.   Milrinone 0.25 mcg/kg/min.  She had 1 dose of Lasix 60 mg IV yesterday with I/Os net negative 469. Creatinine stable at 0.91.   Feels ok. Denies CP or SOB. Weak. Had BM yesterday.   No VT. Remains on amiodarone gtt.   [name] #s (shot personally) CVP 5 PA 42/34 PCWP 24 CI 2.22 PAPi 1.6 LDH 367 -> 492 -> 510 -> 570 Co-ox 66%  RHC 3/8 (on Milrinone 0.25)  RA mean 15 RV 63/20 PA 64/44, mean 53 PCWP mean 37 PAPI 1.33  Oxygen saturations: PA 52% AO 100% Cardiac Output (Fick) 3.97  Cardiac Index (Fick) 1.96  PVR 4 WU Cardiac Output (thermo) 2.27 Cardiac Index (thermo) 1.12  Objective:   Weight Range: 101.2 kg Body mass index is 39.52 kg/m.   Vital Signs:   Temp:  [97.5 F (36.4 C)-98.4 F (36.9 C)] 97.9 F (36.6 C) (03/13 0700) Pulse Rate:  [89-102] 93 (03/13 0700) Resp:  [0-24] 18 (03/13 0700) SpO2:  [94 %-98 %] 96 % (03/13 0700) Arterial Line BP: (73-106)/(55-102) 95/76 (03/13 0700) Weight:  [101.2 kg] 101.2 kg (03/13 0500) Last BM Date : 09/01/21  Weight  change: Filed Weights   08/29/21 1254 08/31/21 0456 09/02/21 0500  Weight: 100.7 kg 101.4 kg 101.2 kg    Intake/Output:   Intake/Output Summary (Last 24 hours) at 09/02/2021 0741 Last data filed at 09/02/2021 0700 Gross per 24 hour  Intake 2445.17 ml  Output 3106 ml  Net -660.83 ml      Physical Exam    General: NAD Neck: No JVD, no thyromegaly or thyroid nodule.  Lungs: Clear to auscultation bilaterally with normal respiratory effort. CV: Lateral PMI.  Heart regular S1/S2, no S3/S4, impella sounds.  No peripheral edema.   Abdomen: Soft, nontender, no hepatosplenomegaly, no distention.  Skin: Intact without lesions or rashes.  Neurologic: Alert and oriented x 3.  Psych: Normal affect. Extremities: No clubbing or cyanosis.  HEENT: Normal.    Telemetry   NSR 90s. Personally reviewed  Labs    CBC Recent Labs    09/01/21 0331 09/01/21 0341 09/02/21 0311 09/02/21 0619  WBC 23.0*  --  24.5*  --   HGB 9.1*   < > 9.7* 11.6*  HCT 30.8*   < > 32.8* 34.0*  MCV 77.4*  --  79.0*  --   PLT 165  --  150  --    < > = values in this interval not displayed.   Basic Metabolic Panel Recent Labs    09/01/21 0331 09/01/21  9794 09/01/21 1637 09/02/21 0311 09/02/21 0619  NA 131*   < >  --  135 135  K 3.7   < >  --  3.7 3.6  CL 93*  --   --  94*  --   CO2 30  --   --  33*  --   GLUCOSE 107*  --   --  101*  --   BUN 26*  --   --  25*  --   CREATININE 1.00  --   --  0.91  --   CALCIUM 8.7*  --   --  9.1  --   MG 2.3  --  2.3 2.3  --   PHOS 3.9  --  4.3 4.6  --    < > = values in this interval not displayed.   Liver Function Tests Recent Labs    09/01/21 0331  AST 76*  ALT 65*  ALKPHOS 104  BILITOT 1.0  PROT 5.7*  ALBUMIN 2.7*    No results for input(s): LIPASE, AMYLASE in the last 72 hours. Cardiac Enzymes No results for input(s): CKTOTAL, CKMB, CKMBINDEX, TROPONINI in the last 72 hours.  BNP: BNP (last 3 results) Recent Labs    07/18/21 1647  08/21/21 1208 08/25/21 1728  BNP 1,331.6* 957.7* 1,117.0*    ProBNP (last 3 results) No results for input(s): PROBNP in the last 8760 hours.   D-Dimer No results for input(s): DDIMER in the last 72 hours.  Hemoglobin A1C No results for input(s): HGBA1C in the last 72 hours.  Fasting Lipid Panel No results for input(s): CHOL, HDL, LDLCALC, TRIG, CHOLHDL, LDLDIRECT in the last 72 hours.  Thyroid Function Tests Recent Labs    09/01/21 0331  TSH <0.010*     Other results:   Imaging    No results found.   Medications:     Scheduled Medications:  (feeding supplement) PROSource Plus  60 mL Oral TID BM   aspirin EC  81 mg Oral Daily   Chlorhexidine Gluconate Cloth  6 each Topical Daily   digoxin  0.0625 mg Oral Daily   feeding supplement (PROSource TF)  45 mL Per Tube Daily   insulin aspart  0-15 Units Subcutaneous TID WC   insulin aspart  0-5 Units Subcutaneous QHS   methimazole  10 mg Oral Daily   methimazole  5 mg Oral QPM   multivitamin with minerals  1 tablet Oral Daily   polyethylene glycol  17 g Oral Daily   predniSONE  20 mg Oral Q breakfast   quiniDINE gluconate  324 mg Oral BID   rosuvastatin  40 mg Oral QHS   senna-docusate  2 tablet Oral BID   sodium chloride flush  10-40 mL Intracatheter Q12H   sodium chloride flush  3 mL Intravenous Q12H   sodium chloride flush  3 mL Intravenous Q12H   spironolactone  25 mg Oral Daily    Infusions:  sodium chloride     sodium chloride     sodium chloride 10 mL/hr at 09/02/21 0700   amiodarone 30 mg/hr (09/02/21 0700)   feeding supplement (VITAL 1.5 CAL) 50 mL/hr at 09/02/21 0600   heparin 50 units/mL (Impella PURGE) in dextrose 5 % 1000 mL bag 50 mL (09/01/21 0719)   heparin 300 Units/hr (09/02/21 0700)   milrinone 0.25 mcg/kg/min (09/02/21 0700)    PRN Medications: sodium chloride, sodium chloride, Place/Maintain arterial line **AND** sodium chloride, sodium chloride, acetaminophen, bisacodyl,  ondansetron (ZOFRAN) IV, ondansetron (ZOFRAN)  IV, sodium chloride flush, sodium chloride flush    Patient Profile   [name] is a 51 y.o. 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 Started on empiric milrinone and lasix gtt.   Assessment/Plan    1. Acute on chronic systolic HF: Nonischemic cardiomyopathy.  She has a [employer] ICD. Cause for cardiomyopathy is uncertain, initially identified peri-partum (after son's birth), so cannot rule out peri-partum CMP.  On and off, she has had frequent PVCs.   RHC in 8/15 showed normal filling pressures and preserved cardiac output. Echo in 4/22 with EF 25%, severe LV dilation.  CPX (8/22) with mild-moderate HF limitation.  Echo this admission with EF < 20% with severe dilation, normal RV size with mildly decreased systolic function, moderate-severe functional MR with dilated IVC. She was admitted with volume overload, NYHA class IV symptoms and low output. HCO3 18 and creatinine 1.5 on admit, co-ox 47%. Milrinone 0.25 started + lasix gtt.  I also suspect hyperthyroidism is playing a role in her CHF exacerbation. 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 Now on Impella 5.5 (placed 3/9) and milrinone 0.25. Cardiac output ok by co-ox and Swan. PAPi 1.6. PCWP still elevated with CVP in normal range.  Impella at P8 with good flow, no alarms. LDH up slightly again.  - Check position of Impella by echo today.  - Continue heparin gtt for Impella.  - Will give 1 dose of Lasix 60 mg IV to keep I/Os mildly negative.  - Continue milrinone 0.25.   - Continue spironolactone 25 mg daily.   - Continue digoxin 0.0625 daily (lower dose).   Level was too high with digoxin 0.125 and concomitant quinidine in the past, level 0.2 today.  - Continue dapagliflozin 10 mg daily.  - ECG with LBBB, QRS 140 msec => with QRS < 150, Dr. [name] has not recommended CRT upgrade in the past.  - Needs advanced therapies (LVAD).  BMI too high currently for transplant (39.4), also with ovarian mass of uncertain etiology.  She is undergoing LVAD workup.  Underwent placement of Impella 5.5 to optimize for VAD on 03/09. Plan possible VAD on Tuesday 2. Pulmonary HTN: Mixed picture with PVR 8.1 on RHC at [facility].  However, repeat study in 8/15 here showed no significant pulmonary arterial hypertension. 2017 RHC with pulmonary venous hypertension. 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 Morbid obesity: BMI still appears out of range for heart transplant.  4. 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  She has been on Levoxyl at home but does not think she has taken for a month.  Now hyperthyroid.  This likely contributes to her CHF exacerbation. [facility] evaluated her for this. NSVT on milrinone makes it difficult for [name] to stop amiodarone.  - Started on prednisone 20 mg daily and methimazole bid.  - Will need outpatient endocrinology followup.  - As above, continuing amiodarone for now with NSVT.  5. PVCs: 11% PVCs + NSVT on 2/20 Zio patch.  Amiodarone started, but PVCs up to 13.3% with NSVT on 3/20 Zio patch. She saw Dr. [name] who thought that the ventricular arrhythmias were electromechanical due to worsening  HF.  In 4/20, she had VT x 2 with ICD discharges in the setting of hypokalemia. Zio patch in 1/21 with PVCs down to 6.2% of beats. VT again in 2/22, quinidine started.  Initially w/ frequent NSVT on milrinone gtt. Started on amio gtt ectopy improved - EP consulted given complicated situation with hyperthyroidism and NSVT. They favor continuing amiodarone and quinidine at  this time. No additional AAD therapy  6. CVA: CVA in 10/21, had thrombectomy with stent placement.  No LV thrombus noted and no atrial fibrillation noted.  - Talked with interventional radiology. >1 year post-stent, ok to stop ticagrelor and continue ASA.  7. Fe deficiency: She reports heavy periods.  Transferrin saturation low.  - Feraheme given 3/6, repeat today 8. CKD stage 3: Creatinine 1.5 on admit, 0.9 today. Follow with milrinone and diuresis.  9. Mitral regurgitation: Moderate-severe functional MR.  Unlikely to be Mitraclip candidate with severely dilated LV (>7 cm).  10. 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  Unable to get MRI with ICD. Seen by GYN. Felt to be most likely non-neoplastic. Tumor markers within normal range. Possible low grade malignancy, could also be benign.  No ascites noted on abdominal CT and no mention of abdominal spread.  She is not a surgical candidate at this time with low output HF.  - Plan at this point would be to stabilize with LVAD then investigate the ovarian mass.  - F/u with GYN as outpatient 11. Elevated WBCs: Suspect due to steroids, no evidence for infection.  12. Malnutrition: Prealbumin 13 - Placed CorTrak for tube feeds, also eating meals. 13. Constipation:  Had BM yesterday.  14. Hypokalemia - supp K. Continue spiro   Mobilize today  CRITICAL CARE Performed by: [name]  Total critical care time: 40 minutes  Critical care time was exclusive of separately billable procedures and treating other patients.  Critical care was necessary to treat or prevent imminent or life-threatening deterioration.  Critical care was time spent personally by me (independent of midlevel providers or residents) on the following activities: development of treatment plan with patient and/or surrogate as well as nursing, discussions with consultants, evaluation of patient's  response to treatment, examination of patient, obtaining history from patient or surrogate, ordering and performing treatments and interventions, ordering and review of laboratory studies, ordering and review of radiographic studies, pulse oximetry and re-evaluation of patient's condition.   Length of Stay: 7  [name], MD  09/02/2021, 7:41 AM  Advanced Heart Failure Team Pager [phone] (M-F; 7a - 5p)  Please contact [facility] for night-coverage after hours (5p -7a ) and weekends on [email or web address]

## 2021-09-02 NOTE — Plan of Care (Signed)
?  Problem: Education: ?Goal: Ability to demonstrate management of disease process will improve ?Outcome: Progressing ?Goal: Ability to verbalize understanding of medication therapies will improve ?Outcome: Progressing ?  ?Problem: Activity: ?Goal: Capacity to carry out activities will improve ?Outcome: Progressing ?  ?Problem: Cardiac: ?Goal: Ability to achieve and maintain adequate cardiopulmonary perfusion will improve ?Outcome: Progressing ?  ?Problem: Education: ?Goal: Knowledge of General Education information will improve ?Description: Including pain rating scale, medication(s)/side effects and non-pharmacologic comfort measures ?Outcome: Progressing ?  ?Problem: Health Behavior/Discharge Planning: ?Goal: Ability to manage health-related needs will improve ?Outcome: Progressing ?  ?Problem: Clinical Measurements: ?Goal: Ability to maintain clinical measurements within normal limits will improve ?Outcome: Progressing ?Goal: Will remain free from infection ?Outcome: Progressing ?Goal: Diagnostic test results will improve ?Outcome: Progressing ?Goal: Respiratory complications will improve ?Outcome: Progressing ?Goal: Cardiovascular complication will be avoided ?Outcome: Progressing ?  ?Problem: Activity: ?Goal: Risk for activity intolerance will decrease ?Outcome: Progressing ?  ?Problem: Nutrition: ?Goal: Adequate nutrition will be maintained ?Outcome: Progressing ?  ?Problem: Elimination: ?Goal: Will not experience complications related to bowel motility ?Outcome: Progressing ?Goal: Will not experience complications related to urinary retention ?Outcome: Progressing ?  ?Problem: Pain Managment: ?Goal: General experience of comfort will improve ?Outcome: Progressing ?  ?

## 2021-09-02 NOTE — Progress Notes (Signed)
CT surgery PM rounds ? ?Patient feels well after walking 1 loop around the ICU hallway without oxygen ?Preparations have been made for HeartMate 3 implantation in a.m. and removal of Impella 5.5. ?CVP 5, good urine output today, minimal ventricular ectopy ? ?Blood pressure (!) 87/45, pulse 92, temperature 98.2 ?F (36.8 ?C), resp. rate 16, height '5\' 3"'$  (1.6 m), weight 101.2 kg, SpO2 98 %.  ? ?

## 2021-09-02 NOTE — Progress Notes (Signed)
Physical Therapy Treatment ?Patient Details ?Name: [name] ?MRN: [id number] ?DOB: 1970-10-10 ?Today's Date: 09/02/2021 ? ? ?History of Present Illness Pt adm 3/6 with acute on chronic heart failure exacerbation. Pt with impella placed  3/9. LVAD planned for Tuesday 09/03/21. PMH - chf due to NICM, ICD, obesity, Lt CVA, pulmonary htn, ckd, HTN. ? ?  ?PT Comments  ? ? Patient progressing well towards PT goals. Session focused on gait training and cardiovascular exercise. Tolerated gait training using EVA walker and 3-4 people to manage lines/IV poles/impella. VSS on RA throughout activity. Mild 1-2/4 DOE with exercise. Plan for LVAD placement tomorrow 09/03/21. Will follow up post surgery to determine disposition/DME needs. ?   ?Recommendations for follow up therapy are one component of a multi-disciplinary discharge planning process, led by the attending physician.  Recommendations may be updated based on patient status, additional functional criteria and insurance authorization. ? ?Follow Up Recommendations ? Other (comment) (TBD after LVAD) ?  ?  ?Assistance Recommended at Discharge Frequent or constant Supervision/Assistance  ?Patient can return home with the following A little help with walking and/or transfers;Help with stairs or ramp for entrance ?  ?Equipment Recommendations ? Rollator (4 wheels);Other (comment) (TBD after LVAD)  ?  ?Recommendations for Other Services   ? ? ?  ?Precautions / Restrictions Precautions ?Precautions: Fall;Other (comment) ?Precaution Comments: impella, swan, NG ?Restrictions ?Weight Bearing Restrictions: Yes ?RUE Weight Bearing: Partial weight bearing ?LUE Weight Bearing: Partial weight bearing  ?  ? ?Mobility ? Bed Mobility ?Overal bed mobility: Needs Assistance ?Bed Mobility: Supine to Sit ?  ?  ?Supine to sit: Min assist, HOB elevated ?  ?  ?General bed mobility comments: Assist with trunk to get to EOB, 2nd and 3rd person for line management. ?  ? ?Transfers ?Overall transfer  level: Needs assistance ?Equipment used:  (EVA) ?Transfers: Sit to/from Stand ?Sit to Stand: Min assist ?  ?  ?  ?  ?  ?General transfer comment: Assist to rise and for balance. Stood from [employer]. Transferred to chair post ambulation. ?  ? ?Ambulation/Gait ?Ambulation/Gait assistance: Min guard, +2 safety/equipment (4 people to manage all lines/IV poles) ?Gait Distance (Feet): 400 Feet ?Assistive device: Ethelene Hal ?Gait Pattern/deviations: Step-through pattern, Decreased stride length ?  ?Gait velocity interpretation: 1.31 - 2.62 ft/sec, indicative of limited community ambulator ?  ?General Gait Details: Slow, steady gait using EVA walker, 1/4 DOE. VSS on RA. ? ? ?Stairs ?  ?  ?  ?  ?  ? ? ?Wheelchair Mobility ?  ? ?Modified Rankin (Stroke Patients Only) ?  ? ? ?  ?Balance Overall balance assessment: Needs assistance ?Sitting-balance support: Feet supported, No upper extremity supported ?Sitting balance-Leahy Scale: Good ?  ?  ?Standing balance support: During functional activity, Reliant on assistive device for balance ?Standing balance-Leahy Scale: Poor ?  ?  ?  ?  ?  ?  ?  ?  ?  ?  ?  ?  ?  ? ?  ?Cognition Arousal/Alertness: Awake/alert ?Behavior During Therapy: [facility] for tasks assessed/performed ?Overall Cognitive Status: Within Functional Limits for tasks assessed ?  ?  ?  ?  ?  ?  ?  ?  ?  ?  ?  ?  ?  ?  ?  ?  ?  ?  ?  ? ?  ?Exercises   ? ?  ?General Comments   ?  ?  ? ?Pertinent Vitals/Pain Pain Assessment ?Pain Assessment: No/denies pain  ? ? ?  Home Living   ?  ?  ?  ?  ?  ?  ?  ?  ?  ?   ?  ?Prior Function    ?  ?  ?   ? ?PT Goals (current goals can now be found in the care plan section) Progress towards PT goals: Progressing toward goals ? ?  ?Frequency ? ? ? Min 3X/week ? ? ? ?  ?PT Plan Current plan remains appropriate  ? ? ?Co-evaluation   ?  ?  ?  ?  ? ?  ?AM-PAC PT "6 Clicks" Mobility   ?Outcome Measure ? Help needed turning from your back to your side while in a flat bed without using bedrails?: A  Little ?Help needed moving from lying on your back to sitting on the side of a flat bed without using bedrails?: A Little ?Help needed moving to and from a bed to a chair (including a wheelchair)?: Total ?Help needed standing up from a chair using your arms (e.g., wheelchair or bedside chair)?: Total ?Help needed to walk in hospital room?: Total ?Help needed climbing 3-5 steps with a railing? : Total ?6 Click Score: 10 ? ?  ?End of Session   ?Activity Tolerance: Patient tolerated treatment well ?Patient left: in chair;with call Loor/phone within reach;with family/visitor present;with nursing/sitter in room ?Nurse Communication: Mobility status ?PT Visit Diagnosis: Other abnormalities of gait and mobility (R26.89);Muscle weakness (generalized) (M62.81) ?  ? ? ?Time: 3244-0102 ?PT Time Calculation (min) (ACUTE ONLY): 24 min ? ?Charges:  $Gait Training: 8-22 mins ?$Therapeutic Exercise: 8-22 mins          ?          ? ?Marisa Severin, PT, DPT ?Acute Rehabilitation Services ?Pager [phone] ?Office [phone] ? ? ? ? ? ?[place] ?09/02/2021, 1:03 PM ? ?

## 2021-09-03 ENCOUNTER — Inpatient Hospital Stay (HOSPITAL_COMMUNITY): Payer: BC Managed Care – PPO | Admitting: Certified Registered Nurse Anesthetist

## 2021-09-03 ENCOUNTER — Inpatient Hospital Stay (HOSPITAL_COMMUNITY)
Admission: EM | Disposition: A | Discharge status: Rehabilitation Facility | Payer: Self-pay | Source: Home / Self Care | Attending: Cardiovascular Disease

## 2021-09-03 ENCOUNTER — Inpatient Hospital Stay (HOSPITAL_COMMUNITY): Payer: BC Managed Care – PPO

## 2021-09-03 ENCOUNTER — Other Ambulatory Visit: Payer: Self-pay

## 2021-09-03 DIAGNOSIS — I509 Heart failure, unspecified: Secondary | ICD-10-CM | POA: Diagnosis not present

## 2021-09-03 DIAGNOSIS — R570 Cardiogenic shock: Secondary | ICD-10-CM | POA: Diagnosis not present

## 2021-09-03 DIAGNOSIS — I428 Other cardiomyopathies: Secondary | ICD-10-CM | POA: Diagnosis not present

## 2021-09-03 DIAGNOSIS — I5023 Acute on chronic systolic (congestive) heart failure: Secondary | ICD-10-CM | POA: Diagnosis not present

## 2021-09-03 LAB — POCT I-STAT 7, (LYTES, BLD GAS, ICA,H+H)
Acid-Base Excess: 8 mmol/L — ABNORMAL HIGH (ref 0.0–2.0)
Acid-Base Excess: 10 mmol/L — ABNORMAL HIGH (ref 0.0–2.0)
Acid-Base Excess: 7 mmol/L — ABNORMAL HIGH (ref 0.0–2.0)
Acid-Base Excess: 8 mmol/L — ABNORMAL HIGH (ref 0.0–2.0)
Acid-Base Excess: 5 mmol/L — ABNORMAL HIGH (ref 0.0–2.0)
Acid-Base Excess: 7 mmol/L — ABNORMAL HIGH (ref 0.0–2.0)
Acid-Base Excess: 23 mmol/L — ABNORMAL HIGH (ref 0.0–2.0)
Acid-Base Excess: 4 mmol/L — ABNORMAL HIGH (ref 0.0–2.0)
Acid-Base Excess: 1 mmol/L (ref 0.0–2.0)
Acid-Base Excess: 2 mmol/L (ref 0.0–2.0)
Acid-Base Excess: 1 mmol/L (ref 0.0–2.0)
Acid-Base Excess: 1 mmol/L (ref 0.0–2.0)
Acid-base deficit: 1 mmol/L (ref 0.0–2.0)
Acid-base deficit: 1 mmol/L (ref 0.0–2.0)
Bicarbonate: 32.3 mmol/L — ABNORMAL HIGH (ref 20.0–28.0)
Bicarbonate: 35.1 mmol/L — ABNORMAL HIGH (ref 20.0–28.0)
Bicarbonate: 32.1 mmol/L — ABNORMAL HIGH (ref 20.0–28.0)
Bicarbonate: 32.6 mmol/L — ABNORMAL HIGH (ref 20.0–28.0)
Bicarbonate: 30.1 mmol/L — ABNORMAL HIGH (ref 20.0–28.0)
Bicarbonate: 31.9 mmol/L — ABNORMAL HIGH (ref 20.0–28.0)
Bicarbonate: 28.9 mmol/L — ABNORMAL HIGH (ref 20.0–28.0)
Bicarbonate: 49.3 mmol/L — ABNORMAL HIGH (ref 20.0–28.0)
Bicarbonate: 26.9 mmol/L (ref 20.0–28.0)
Bicarbonate: 27.1 mmol/L (ref 20.0–28.0)
Bicarbonate: 27.3 mmol/L (ref 20.0–28.0)
Bicarbonate: 26 mmol/L (ref 20.0–28.0)
Bicarbonate: 23.8 mmol/L (ref 20.0–28.0)
Bicarbonate: 25.1 mmol/L (ref 20.0–28.0)
Calcium, Ion: 1.26 mmol/L (ref 1.15–1.40)
Calcium, Ion: 1.27 mmol/L (ref 1.15–1.40)
Calcium, Ion: 1.04 mmol/L — ABNORMAL LOW (ref 1.15–1.40)
Calcium, Ion: 1.13 mmol/L — ABNORMAL LOW (ref 1.15–1.40)
Calcium, Ion: 1.11 mmol/L — ABNORMAL LOW (ref 1.15–1.40)
Calcium, Ion: 1.11 mmol/L — ABNORMAL LOW (ref 1.15–1.40)
Calcium, Ion: 0.74 mmol/L — CL (ref 1.15–1.40)
Calcium, Ion: 1 mmol/L — ABNORMAL LOW (ref 1.15–1.40)
Calcium, Ion: 1.01 mmol/L — ABNORMAL LOW (ref 1.15–1.40)
Calcium, Ion: 1.16 mmol/L (ref 1.15–1.40)
Calcium, Ion: 1.2 mmol/L (ref 1.15–1.40)
Calcium, Ion: 1.3 mmol/L (ref 1.15–1.40)
Calcium, Ion: 1.27 mmol/L (ref 1.15–1.40)
Calcium, Ion: 1.25 mmol/L (ref 1.15–1.40)
HCT: 33 % — ABNORMAL LOW (ref 36.0–46.0)
HCT: 32 % — ABNORMAL LOW (ref 36.0–46.0)
HCT: 24 % — ABNORMAL LOW (ref 36.0–46.0)
HCT: 24 % — ABNORMAL LOW (ref 36.0–46.0)
HCT: 21 % — ABNORMAL LOW (ref 36.0–46.0)
HCT: 25 % — ABNORMAL LOW (ref 36.0–46.0)
HCT: 26 % — ABNORMAL LOW (ref 36.0–46.0)
HCT: 26 % — ABNORMAL LOW (ref 36.0–46.0)
HCT: 33 % — ABNORMAL LOW (ref 36.0–46.0)
HCT: 33 % — ABNORMAL LOW (ref 36.0–46.0)
HCT: 34 % — ABNORMAL LOW (ref 36.0–46.0)
HCT: 26 % — ABNORMAL LOW (ref 36.0–46.0)
HCT: 28 % — ABNORMAL LOW (ref 36.0–46.0)
HCT: 25 % — ABNORMAL LOW (ref 36.0–46.0)
Hemoglobin: 11.2 g/dL — ABNORMAL LOW (ref 12.0–15.0)
Hemoglobin: 10.9 g/dL — ABNORMAL LOW (ref 12.0–15.0)
Hemoglobin: 8.2 g/dL — ABNORMAL LOW (ref 12.0–15.0)
Hemoglobin: 8.2 g/dL — ABNORMAL LOW (ref 12.0–15.0)
Hemoglobin: 7.1 g/dL — ABNORMAL LOW (ref 12.0–15.0)
Hemoglobin: 8.5 g/dL — ABNORMAL LOW (ref 12.0–15.0)
Hemoglobin: 8.8 g/dL — ABNORMAL LOW (ref 12.0–15.0)
Hemoglobin: 8.8 g/dL — ABNORMAL LOW (ref 12.0–15.0)
Hemoglobin: 11.2 g/dL — ABNORMAL LOW (ref 12.0–15.0)
Hemoglobin: 11.2 g/dL — ABNORMAL LOW (ref 12.0–15.0)
Hemoglobin: 11.6 g/dL — ABNORMAL LOW (ref 12.0–15.0)
Hemoglobin: 8.8 g/dL — ABNORMAL LOW (ref 12.0–15.0)
Hemoglobin: 9.5 g/dL — ABNORMAL LOW (ref 12.0–15.0)
Hemoglobin: 8.5 g/dL — ABNORMAL LOW (ref 12.0–15.0)
O2 Saturation: 93 %
O2 Saturation: 100 %
O2 Saturation: 100 %
O2 Saturation: 100 %
O2 Saturation: 100 %
O2 Saturation: 100 %
O2 Saturation: 100 %
O2 Saturation: 100 %
O2 Saturation: 100 %
O2 Saturation: 97 %
O2 Saturation: 95 %
O2 Saturation: 100 %
O2 Saturation: 98 %
O2 Saturation: 99 %
Patient temperature: 98
Patient temperature: 36.5
Patient temperature: 36
Patient temperature: 36.1
Patient temperature: 36.9
Potassium: 4.2 mmol/L (ref 3.5–5.1)
Potassium: 4 mmol/L (ref 3.5–5.1)
Potassium: 4.2 mmol/L (ref 3.5–5.1)
Potassium: 4.3 mmol/L (ref 3.5–5.1)
Potassium: 4.2 mmol/L (ref 3.5–5.1)
Potassium: 4.3 mmol/L (ref 3.5–5.1)
Potassium: 4.2 mmol/L (ref 3.5–5.1)
Potassium: 4.5 mmol/L (ref 3.5–5.1)
Potassium: 3.7 mmol/L (ref 3.5–5.1)
Potassium: 3.9 mmol/L (ref 3.5–5.1)
Potassium: 3.8 mmol/L (ref 3.5–5.1)
Potassium: 3.5 mmol/L (ref 3.5–5.1)
Potassium: 3.6 mmol/L (ref 3.5–5.1)
Potassium: 3.9 mmol/L (ref 3.5–5.1)
Sodium: 132 mmol/L — ABNORMAL LOW (ref 135–145)
Sodium: 130 mmol/L — ABNORMAL LOW (ref 135–145)
Sodium: 133 mmol/L — ABNORMAL LOW (ref 135–145)
Sodium: 134 mmol/L — ABNORMAL LOW (ref 135–145)
Sodium: 132 mmol/L — ABNORMAL LOW (ref 135–145)
Sodium: 133 mmol/L — ABNORMAL LOW (ref 135–145)
Sodium: 132 mmol/L — ABNORMAL LOW (ref 135–145)
Sodium: 143 mmol/L (ref 135–145)
Sodium: 134 mmol/L — ABNORMAL LOW (ref 135–145)
Sodium: 135 mmol/L (ref 135–145)
Sodium: 137 mmol/L (ref 135–145)
Sodium: 136 mmol/L (ref 135–145)
Sodium: 136 mmol/L (ref 135–145)
Sodium: 136 mmol/L (ref 135–145)
TCO2: 34 mmol/L — ABNORMAL HIGH (ref 22–32)
TCO2: 37 mmol/L — ABNORMAL HIGH (ref 22–32)
TCO2: 33 mmol/L — ABNORMAL HIGH (ref 22–32)
TCO2: 34 mmol/L — ABNORMAL HIGH (ref 22–32)
TCO2: 31 mmol/L (ref 22–32)
TCO2: 33 mmol/L — ABNORMAL HIGH (ref 22–32)
TCO2: 30 mmol/L (ref 22–32)
TCO2: >50 mmol/L — ABNORMAL HIGH (ref 22–32)
TCO2: 28 mmol/L (ref 22–32)
TCO2: 29 mmol/L (ref 22–32)
TCO2: 29 mmol/L (ref 22–32)
TCO2: 27 mmol/L (ref 22–32)
TCO2: 25 mmol/L (ref 22–32)
TCO2: 26 mmol/L (ref 22–32)
pCO2 arterial: 41 mmHg (ref 32–48)
pCO2 arterial: 47.5 mmHg (ref 32–48)
pCO2 arterial: 43.2 mmHg (ref 32–48)
pCO2 arterial: 50.5 mmHg — ABNORMAL HIGH (ref 32–48)
pCO2 arterial: 45.2 mmHg (ref 32–48)
pCO2 arterial: 48.1 mmHg — ABNORMAL HIGH (ref 32–48)
pCO2 arterial: 46.7 mmHg (ref 32–48)
pCO2 arterial: 65.2 mmHg (ref 32–48)
pCO2 arterial: 44.6 mmHg (ref 32–48)
pCO2 arterial: 50.1 mmHg — ABNORMAL HIGH (ref 32–48)
pCO2 arterial: 63 mmHg — ABNORMAL HIGH (ref 32–48)
pCO2 arterial: 41.6 mmHg (ref 32–48)
pCO2 arterial: 39.5 mmHg (ref 32–48)
pCO2 arterial: 37 mmHg (ref 32–48)
pH, Arterial: 7.503 — ABNORMAL HIGH (ref 7.35–7.45)
pH, Arterial: 7.477 — ABNORMAL HIGH (ref 7.35–7.45)
pH, Arterial: 7.418 (ref 7.35–7.45)
pH, Arterial: 7.479 — ABNORMAL HIGH (ref 7.35–7.45)
pH, Arterial: 7.43 (ref 7.35–7.45)
pH, Arterial: 7.431 (ref 7.35–7.45)
pH, Arterial: 7.399 (ref 7.35–7.45)
pH, Arterial: 7.487 — ABNORMAL HIGH (ref 7.35–7.45)
pH, Arterial: 7.341 — ABNORMAL LOW (ref 7.35–7.45)
pH, Arterial: 7.389 (ref 7.35–7.45)
pH, Arterial: 7.241 — ABNORMAL LOW (ref 7.35–7.45)
pH, Arterial: 7.4 (ref 7.35–7.45)
pH, Arterial: 7.384 (ref 7.35–7.45)
pH, Arterial: 7.44 (ref 7.35–7.45)
pO2, Arterial: 61 mmHg — ABNORMAL LOW (ref 83–108)
pO2, Arterial: 463 mmHg — ABNORMAL HIGH (ref 83–108)
pO2, Arterial: 281 mmHg — ABNORMAL HIGH (ref 83–108)
pO2, Arterial: 366 mmHg — ABNORMAL HIGH (ref 83–108)
pO2, Arterial: 315 mmHg — ABNORMAL HIGH (ref 83–108)
pO2, Arterial: 322 mmHg — ABNORMAL HIGH (ref 83–108)
pO2, Arterial: 296 mmHg — ABNORMAL HIGH (ref 83–108)
pO2, Arterial: 385 mmHg — ABNORMAL HIGH (ref 83–108)
pO2, Arterial: 256 mmHg — ABNORMAL HIGH (ref 83–108)
pO2, Arterial: 98 mmHg (ref 83–108)
pO2, Arterial: 89 mmHg (ref 83–108)
pO2, Arterial: 344 mmHg — ABNORMAL HIGH (ref 83–108)
pO2, Arterial: 107 mmHg (ref 83–108)
pO2, Arterial: 145 mmHg — ABNORMAL HIGH (ref 83–108)

## 2021-09-03 LAB — CBC
HCT: 32.3 % — ABNORMAL LOW (ref 36.0–46.0)
HCT: 32.9 % — ABNORMAL LOW (ref 36.0–46.0)
HCT: 25.9 % — ABNORMAL LOW (ref 36.0–46.0)
Hemoglobin: 9.5 g/dL — ABNORMAL LOW (ref 12.0–15.0)
Hemoglobin: 10.6 g/dL — ABNORMAL LOW (ref 12.0–15.0)
Hemoglobin: 8.6 g/dL — ABNORMAL LOW (ref 12.0–15.0)
MCH: 23.2 pg — ABNORMAL LOW (ref 26.0–34.0)
MCH: 27 pg (ref 26.0–34.0)
MCH: 27.3 pg (ref 26.0–34.0)
MCHC: 29.4 g/dL — ABNORMAL LOW (ref 30.0–36.0)
MCHC: 32.2 g/dL (ref 30.0–36.0)
MCHC: 33.2 g/dL (ref 30.0–36.0)
MCV: 79 fL — ABNORMAL LOW (ref 80.0–100.0)
MCV: 83.7 fL (ref 80.0–100.0)
MCV: 82.2 fL (ref 80.0–100.0)
Platelets: 125 10*3/uL — ABNORMAL LOW (ref 150–400)
Platelets: 120 10*3/uL — ABNORMAL LOW (ref 150–400)
Platelets: 93 10*3/uL — ABNORMAL LOW (ref 150–400)
RBC: 4.09 MIL/uL (ref 3.87–5.11)
RBC: 3.93 MIL/uL (ref 3.87–5.11)
RBC: 3.15 MIL/uL — ABNORMAL LOW (ref 3.87–5.11)
RDW: 23.2 % — ABNORMAL HIGH (ref 11.5–15.5)
RDW: 21.4 % — ABNORMAL HIGH (ref 11.5–15.5)
RDW: 19.6 % — ABNORMAL HIGH (ref 11.5–15.5)
WBC: 26 10*3/uL — ABNORMAL HIGH (ref 4.0–10.5)
WBC: 36.4 10*3/uL — ABNORMAL HIGH (ref 4.0–10.5)
WBC: 27.4 10*3/uL — ABNORMAL HIGH (ref 4.0–10.5)
nRBC: 2.1 % — ABNORMAL HIGH (ref 0.0–0.2)
nRBC: 7 % — ABNORMAL HIGH (ref 0.0–0.2)
nRBC: 13 % — ABNORMAL HIGH (ref 0.0–0.2)

## 2021-09-03 LAB — COOXEMETRY PANEL
Carboxyhemoglobin: 2.6 % — ABNORMAL HIGH (ref 0.5–1.5)
Carboxyhemoglobin: 1.8 % — ABNORMAL HIGH (ref 0.5–1.5)
Carboxyhemoglobin: 1.3 % (ref 0.5–1.5)
Methemoglobin: <0.7 % (ref 0.0–1.5)
Methemoglobin: 0.9 % (ref 0.0–1.5)
Methemoglobin: 1 % (ref 0.0–1.5)
O2 Saturation: 75.8 %
O2 Saturation: 75.8 %
O2 Saturation: 68 %
Total hemoglobin: 9.5 g/dL — ABNORMAL LOW (ref 12.0–16.0)
Total hemoglobin: 10.4 g/dL — ABNORMAL LOW (ref 12.0–16.0)
Total hemoglobin: 9 g/dL — ABNORMAL LOW (ref 12.0–16.0)

## 2021-09-03 LAB — BASIC METABOLIC PANEL
Anion gap: 9 (ref 5–15)
Anion gap: 8 (ref 5–15)
Anion gap: 11 (ref 5–15)
BUN: 25 mg/dL — ABNORMAL HIGH (ref 6–20)
BUN: 21 mg/dL — ABNORMAL HIGH (ref 6–20)
BUN: 22 mg/dL — ABNORMAL HIGH (ref 6–20)
CO2: 29 mmol/L (ref 22–32)
CO2: 28 mmol/L (ref 22–32)
CO2: 24 mmol/L (ref 22–32)
Calcium: 9.2 mg/dL (ref 8.9–10.3)
Calcium: 8.2 mg/dL — ABNORMAL LOW (ref 8.9–10.3)
Calcium: 8.9 mg/dL (ref 8.9–10.3)
Chloride: 94 mmol/L — ABNORMAL LOW (ref 98–111)
Chloride: 101 mmol/L (ref 98–111)
Chloride: 100 mmol/L (ref 98–111)
Creatinine, Ser: 0.78 mg/dL (ref 0.44–1.00)
Creatinine, Ser: 1.02 mg/dL — ABNORMAL HIGH (ref 0.44–1.00)
Creatinine, Ser: 1.04 mg/dL — ABNORMAL HIGH (ref 0.44–1.00)
GFR, Estimated: >60 mL/min (ref >60)
GFR, Estimated: >60 mL/min (ref >60)
GFR, Estimated: >60 mL/min (ref >60)
Glucose, Bld: 94 mg/dL (ref 70–99)
Glucose, Bld: 124 mg/dL — ABNORMAL HIGH (ref 70–99)
Glucose, Bld: 160 mg/dL — ABNORMAL HIGH (ref 70–99)
Potassium: 4.1 mmol/L (ref 3.5–5.1)
Potassium: 3.7 mmol/L (ref 3.5–5.1)
Potassium: 4.1 mmol/L (ref 3.5–5.1)
Sodium: 132 mmol/L — ABNORMAL LOW (ref 135–145)
Sodium: 137 mmol/L (ref 135–145)
Sodium: 135 mmol/L (ref 135–145)

## 2021-09-03 LAB — POCT I-STAT, CHEM 8
BUN: 24 mg/dL — ABNORMAL HIGH (ref 6–20)
BUN: 23 mg/dL — ABNORMAL HIGH (ref 6–20)
BUN: 24 mg/dL — ABNORMAL HIGH (ref 6–20)
BUN: 26 mg/dL — ABNORMAL HIGH (ref 6–20)
BUN: 26 mg/dL — ABNORMAL HIGH (ref 6–20)
BUN: 26 mg/dL — ABNORMAL HIGH (ref 6–20)
Calcium, Ion: 1.26 mmol/L (ref 1.15–1.40)
Calcium, Ion: 1.13 mmol/L — ABNORMAL LOW (ref 1.15–1.40)
Calcium, Ion: 1.2 mmol/L (ref 1.15–1.40)
Calcium, Ion: 1.51 mmol/L (ref 1.15–1.40)
Calcium, Ion: 0.91 mmol/L — ABNORMAL LOW (ref 1.15–1.40)
Calcium, Ion: 1.16 mmol/L (ref 1.15–1.40)
Chloride: 92 mmol/L — ABNORMAL LOW (ref 98–111)
Chloride: 93 mmol/L — ABNORMAL LOW (ref 98–111)
Chloride: 94 mmol/L — ABNORMAL LOW (ref 98–111)
Chloride: 96 mmol/L — ABNORMAL LOW (ref 98–111)
Chloride: 93 mmol/L — ABNORMAL LOW (ref 98–111)
Chloride: 96 mmol/L — ABNORMAL LOW (ref 98–111)
Creatinine, Ser: 0.8 mg/dL (ref 0.44–1.00)
Creatinine, Ser: 0.8 mg/dL (ref 0.44–1.00)
Creatinine, Ser: 0.8 mg/dL (ref 0.44–1.00)
Creatinine, Ser: 0.8 mg/dL (ref 0.44–1.00)
Creatinine, Ser: 0.8 mg/dL (ref 0.44–1.00)
Creatinine, Ser: 0.8 mg/dL (ref 0.44–1.00)
Glucose, Bld: 120 mg/dL — ABNORMAL HIGH (ref 70–99)
Glucose, Bld: 122 mg/dL — ABNORMAL HIGH (ref 70–99)
Glucose, Bld: 126 mg/dL — ABNORMAL HIGH (ref 70–99)
Glucose, Bld: 170 mg/dL — ABNORMAL HIGH (ref 70–99)
Glucose, Bld: 196 mg/dL — ABNORMAL HIGH (ref 70–99)
Glucose, Bld: 183 mg/dL — ABNORMAL HIGH (ref 70–99)
HCT: 34 % — ABNORMAL LOW (ref 36.0–46.0)
HCT: 26 % — ABNORMAL LOW (ref 36.0–46.0)
HCT: 29 % — ABNORMAL LOW (ref 36.0–46.0)
HCT: 22 % — ABNORMAL LOW (ref 36.0–46.0)
HCT: 28 % — ABNORMAL LOW (ref 36.0–46.0)
HCT: 33 % — ABNORMAL LOW (ref 36.0–46.0)
Hemoglobin: 11.6 g/dL — ABNORMAL LOW (ref 12.0–15.0)
Hemoglobin: 8.8 g/dL — ABNORMAL LOW (ref 12.0–15.0)
Hemoglobin: 9.9 g/dL — ABNORMAL LOW (ref 12.0–15.0)
Hemoglobin: 7.5 g/dL — ABNORMAL LOW (ref 12.0–15.0)
Hemoglobin: 9.5 g/dL — ABNORMAL LOW (ref 12.0–15.0)
Hemoglobin: 11.2 g/dL — ABNORMAL LOW (ref 12.0–15.0)
Potassium: 4 mmol/L (ref 3.5–5.1)
Potassium: 4.1 mmol/L (ref 3.5–5.1)
Potassium: 4.3 mmol/L (ref 3.5–5.1)
Potassium: 4.4 mmol/L (ref 3.5–5.1)
Potassium: 4.5 mmol/L (ref 3.5–5.1)
Potassium: 3.9 mmol/L (ref 3.5–5.1)
Sodium: 132 mmol/L — ABNORMAL LOW (ref 135–145)
Sodium: 132 mmol/L — ABNORMAL LOW (ref 135–145)
Sodium: 134 mmol/L — ABNORMAL LOW (ref 135–145)
Sodium: 133 mmol/L — ABNORMAL LOW (ref 135–145)
Sodium: 135 mmol/L (ref 135–145)
Sodium: 135 mmol/L (ref 135–145)
TCO2: 33 mmol/L — ABNORMAL HIGH (ref 22–32)
TCO2: 28 mmol/L (ref 22–32)
TCO2: 32 mmol/L (ref 22–32)
TCO2: 30 mmol/L (ref 22–32)
TCO2: 33 mmol/L — ABNORMAL HIGH (ref 22–32)
TCO2: 27 mmol/L (ref 22–32)

## 2021-09-03 LAB — POCT I-STAT EG7
Acid-Base Excess: 6 mmol/L — ABNORMAL HIGH (ref 0.0–2.0)
Bicarbonate: 31.4 mmol/L — ABNORMAL HIGH (ref 20.0–28.0)
Calcium, Ion: 1.11 mmol/L — ABNORMAL LOW (ref 1.15–1.40)
HCT: 25 % — ABNORMAL LOW (ref 36.0–46.0)
Hemoglobin: 8.5 g/dL — ABNORMAL LOW (ref 12.0–15.0)
O2 Saturation: 63 %
Potassium: 4.2 mmol/L (ref 3.5–5.1)
Sodium: 134 mmol/L — ABNORMAL LOW (ref 135–145)
TCO2: 33 mmol/L — ABNORMAL HIGH (ref 22–32)
pCO2, Ven: 47.4 mmHg (ref 44–60)
pH, Ven: 7.429 (ref 7.25–7.43)
pO2, Ven: 32 mmHg (ref 32–45)

## 2021-09-03 LAB — DIC (DISSEMINATED INTRAVASCULAR COAGULATION)PANEL
D-Dimer, Quant: 3.35 ug/mL-FEU — ABNORMAL HIGH (ref 0.00–0.50)
Fibrinogen: 336 mg/dL (ref 210–475)
INR: 1.5 — ABNORMAL HIGH (ref 0.8–1.2)
Platelets: 120 10*3/uL — ABNORMAL LOW (ref 150–400)
Prothrombin Time: 17.8 seconds — ABNORMAL HIGH (ref 11.4–15.2)
Smear Review: NONE SEEN
aPTT: 34 seconds (ref 24–36)

## 2021-09-03 LAB — GLUCOSE, CAPILLARY
Glucose-Capillary: 85 mg/dL (ref 70–99)
Glucose-Capillary: 139 mg/dL — ABNORMAL HIGH (ref 70–99)
Glucose-Capillary: 125 mg/dL — ABNORMAL HIGH (ref 70–99)
Glucose-Capillary: 143 mg/dL — ABNORMAL HIGH (ref 70–99)
Glucose-Capillary: 160 mg/dL — ABNORMAL HIGH (ref 70–99)
Glucose-Capillary: 163 mg/dL — ABNORMAL HIGH (ref 70–99)
Glucose-Capillary: 188 mg/dL — ABNORMAL HIGH (ref 70–99)
Glucose-Capillary: 180 mg/dL — ABNORMAL HIGH (ref 70–99)

## 2021-09-03 LAB — LACTATE DEHYDROGENASE: LDH: 537 U/L — ABNORMAL HIGH (ref 98–192)

## 2021-09-03 LAB — PREPARE RBC (CROSSMATCH)

## 2021-09-03 LAB — PATHOLOGIST SMEAR REVIEW

## 2021-09-03 LAB — MAGNESIUM
Magnesium: 1.9 mg/dL (ref 1.7–2.4)
Magnesium: 2 mg/dL (ref 1.7–2.4)
Magnesium: 2.9 mg/dL — ABNORMAL HIGH (ref 1.7–2.4)

## 2021-09-03 LAB — PLATELET COUNT: Platelets: 100 10*3/uL — ABNORMAL LOW (ref 150–400)

## 2021-09-03 LAB — HEMOGLOBIN AND HEMATOCRIT, BLOOD
HCT: 22 % — ABNORMAL LOW (ref 36.0–46.0)
Hemoglobin: 6.8 g/dL — CL (ref 12.0–15.0)

## 2021-09-03 LAB — HEPARIN LEVEL (UNFRACTIONATED): Heparin Unfractionated: 0.2 IU/mL — ABNORMAL LOW (ref 0.30–0.70)

## 2021-09-03 LAB — MRSA NEXT GEN BY PCR, NASAL: MRSA by PCR Next Gen: NOT DETECTED

## 2021-09-03 SURGERY — INSERTION OF IMPLANTABLE LEFT VENTRICULAR ASSIST DEVICE
Anesthesia: General | Site: Chest

## 2021-09-03 MED ORDER — FENTANYL CITRATE (PF) 250 MCG/5ML IJ SOLN
INTRAMUSCULAR | Status: AC
  Filled 2021-09-03: qty 5

## 2021-09-03 MED ORDER — 0.9 % SODIUM CHLORIDE (POUR BTL) OPTIME
TOPICAL | Status: DC | PRN
  Administered 2021-09-03 (×3): 1000 mL

## 2021-09-03 MED ORDER — PANTOPRAZOLE SODIUM 40 MG PO TBEC: 40.0000 mg | DELAYED_RELEASE_TABLET | Freq: Every day | ORAL | Status: DC

## 2021-09-03 MED ORDER — METHYLPREDNISOLONE SODIUM SUCC 40 MG IJ SOLR
20.0000 mg | Freq: Every day | INTRAMUSCULAR | Status: DC
  Administered 2021-09-04 – 2021-09-05 (×2): 20 mg via INTRAVENOUS
  Filled 2021-09-03 (×2): qty 1

## 2021-09-03 MED ORDER — VASOPRESSIN 20 UNIT/ML IV SOLN
INTRAVENOUS | Status: AC
  Filled 2021-09-03: qty 1

## 2021-09-03 MED ORDER — CHLORHEXIDINE GLUCONATE 0.12% ORAL RINSE (MEDLINE KIT)
15.0000 mL | Freq: Two times a day (BID) | OROMUCOSAL | Status: DC
  Administered 2021-09-03 – 2021-09-04 (×2): 15 mL via OROMUCOSAL

## 2021-09-03 MED ORDER — FLUCONAZOLE IN SODIUM CHLORIDE 400-0.9 MG/200ML-% IV SOLN
400.0000 mg | Freq: Once | INTRAVENOUS | Status: AC
  Administered 2021-09-04: 400 mg via INTRAVENOUS
  Filled 2021-09-03: qty 200

## 2021-09-03 MED ORDER — SUCCINYLCHOLINE CHLORIDE 200 MG/10ML IV SOSY
PREFILLED_SYRINGE | INTRAVENOUS | Status: AC
  Filled 2021-09-03: qty 10

## 2021-09-03 MED ORDER — CHLORHEXIDINE GLUCONATE CLOTH 2 % EX PADS
6.0000 | MEDICATED_PAD | Freq: Every day | CUTANEOUS | Status: DC
  Administered 2021-09-04 – 2021-09-13 (×10): 6 via TOPICAL

## 2021-09-03 MED ORDER — SODIUM CHLORIDE 0.9 % IV SOLN: 250.0000 mL | INTRAVENOUS | Status: DC

## 2021-09-03 MED ORDER — FLUCONAZOLE IN SODIUM CHLORIDE 400-0.9 MG/200ML-% IV SOLN: INTRAVENOUS | Status: DC | PRN

## 2021-09-03 MED ORDER — LACTATED RINGERS IV SOLN: 500.0000 mL | Freq: Once | INTRAVENOUS | Status: DC | PRN

## 2021-09-03 MED ORDER — DEXTROSE 50 % IV SOLN: 0.0000 mL | INTRAVENOUS | Status: DC | PRN

## 2021-09-03 MED ORDER — HEPARIN SODIUM (PORCINE) 1000 UNIT/ML IJ SOLN
INTRAMUSCULAR | Status: DC | PRN
  Administered 2021-09-03: 32000 [IU] via INTRAVENOUS

## 2021-09-03 MED ORDER — ALBUMIN HUMAN 5 % IV SOLN
250.0000 mL | INTRAVENOUS | Status: AC | PRN
  Administered 2021-09-03 (×4): 12.5 g via INTRAVENOUS
  Filled 2021-09-03: qty 250

## 2021-09-03 MED ORDER — ROCURONIUM BROMIDE 10 MG/ML (PF) SYRINGE
PREFILLED_SYRINGE | INTRAVENOUS | Status: AC
  Filled 2021-09-03: qty 20

## 2021-09-03 MED ORDER — DOCUSATE SODIUM 100 MG PO CAPS: 200.0000 mg | ORAL_CAPSULE | Freq: Every day | ORAL | Status: DC

## 2021-09-03 MED ORDER — ALBUMIN HUMAN 5 % IV SOLN
INTRAVENOUS | Status: DC | PRN
Start: 2021-09-03 — End: 2021-09-03

## 2021-09-03 MED ORDER — HEPARIN SODIUM (PORCINE) 1000 UNIT/ML IJ SOLN
INTRAMUSCULAR | Status: AC
  Filled 2021-09-03: qty 10

## 2021-09-03 MED ORDER — BISACODYL 10 MG RE SUPP: 10.0000 mg | Freq: Every day | RECTAL | Status: DC

## 2021-09-03 MED ORDER — POTASSIUM CHLORIDE 10 MEQ/50ML IV SOLN
10.0000 meq | INTRAVENOUS | Status: AC
  Administered 2021-09-03 (×3): 10 meq via INTRAVENOUS

## 2021-09-03 MED ORDER — CALCIUM CHLORIDE 10 % IV SOLN
1.0000 g | Freq: Once | INTRAVENOUS | Status: AC
  Administered 2021-09-03: 1 g via INTRAVENOUS

## 2021-09-03 MED ORDER — ACETAMINOPHEN 650 MG RE SUPP
650.0000 mg | Freq: Once | RECTAL | Status: AC
  Administered 2021-09-03: 650 mg via RECTAL

## 2021-09-03 MED ORDER — VANCOMYCIN HCL 1000 MG IV SOLR
INTRAVENOUS | Status: DC | PRN
  Administered 2021-09-03: 1000 mg

## 2021-09-03 MED ORDER — AMIODARONE IV BOLUS ONLY 150 MG/100ML
INTRAVENOUS | Status: DC | PRN
  Administered 2021-09-03: 150 mg via INTRAVENOUS

## 2021-09-03 MED ORDER — TRAMADOL HCL 50 MG PO TABS: 50.0000 mg | ORAL_TABLET | ORAL | Status: DC | PRN

## 2021-09-03 MED ORDER — VANCOMYCIN HCL IN DEXTROSE 1-5 GM/200ML-% IV SOLN
1000.0000 mg | Freq: Two times a day (BID) | INTRAVENOUS | Status: AC
  Administered 2021-09-03 – 2021-09-04 (×3): 1000 mg via INTRAVENOUS
  Filled 2021-09-03 (×3): qty 200

## 2021-09-03 MED ORDER — VASOPRESSIN 20 UNITS/100 ML INFUSION FOR SHOCK: 0.0000 [IU]/min | INTRAVENOUS | Status: DC

## 2021-09-03 MED ORDER — PHENYLEPHRINE 40 MCG/ML (10ML) SYRINGE FOR IV PUSH (FOR BLOOD PRESSURE SUPPORT)
PREFILLED_SYRINGE | INTRAVENOUS | Status: DC | PRN
Start: 2021-09-03 — End: 2021-09-03
  Administered 2021-09-03 (×2): 40 ug via INTRAVENOUS

## 2021-09-03 MED ORDER — SODIUM CHLORIDE 0.9 % IV SOLN
600.0000 mg | Freq: Once | INTRAVENOUS | Status: AC
  Administered 2021-09-04: 600 mg via INTRAVENOUS
  Filled 2021-09-03: qty 10

## 2021-09-03 MED ORDER — DEXMEDETOMIDINE HCL IN NACL 400 MCG/100ML IV SOLN
0.0000 ug/kg/h | INTRAVENOUS | Status: DC
  Administered 2021-09-03 (×2): 0.7 ug/kg/h via INTRAVENOUS
  Administered 2021-09-04: 1 ug/kg/h via INTRAVENOUS
  Administered 2021-09-04: 1.2 ug/kg/h via INTRAVENOUS
  Administered 2021-09-04: 1 ug/kg/h via INTRAVENOUS
  Administered 2021-09-04: 1.2 ug/kg/h via INTRAVENOUS
  Filled 2021-09-03 (×6): qty 100

## 2021-09-03 MED ORDER — CHLORHEXIDINE GLUCONATE CLOTH 2 % EX PADS
6.0000 | MEDICATED_PAD | Freq: Every day | CUTANEOUS | Status: DC
  Administered 2021-09-04 – 2021-09-20 (×8): 6 via TOPICAL

## 2021-09-03 MED ORDER — MILRINONE LACTATE IN DEXTROSE 20-5 MG/100ML-% IV SOLN
0.5000 ug/kg/min | INTRAVENOUS | Status: DC
  Administered 2021-09-03 – 2021-09-04 (×3): 0.5 ug/kg/min via INTRAVENOUS
  Filled 2021-09-03 (×3): qty 100

## 2021-09-03 MED ORDER — HEMOSTATIC AGENTS (NO CHARGE) OPTIME
TOPICAL | Status: DC | PRN
  Administered 2021-09-03 (×3): 1 via TOPICAL

## 2021-09-03 MED ORDER — PROPOFOL 10 MG/ML IV BOLUS
INTRAVENOUS | Status: AC
  Filled 2021-09-03: qty 20

## 2021-09-03 MED ORDER — SODIUM CHLORIDE 0.45 % IV SOLN: INTRAVENOUS | Status: DC | PRN

## 2021-09-03 MED ORDER — MIDAZOLAM HCL (PF) 5 MG/ML IJ SOLN
INTRAMUSCULAR | Status: DC | PRN
  Administered 2021-09-03: 1 mg via INTRAVENOUS
  Administered 2021-09-03: 3 mg via INTRAVENOUS
  Administered 2021-09-03 (×2): 1 mg via INTRAVENOUS
  Administered 2021-09-03 (×2): 2 mg via INTRAVENOUS

## 2021-09-03 MED ORDER — CEFAZOLIN SODIUM-DEXTROSE 2-4 GM/100ML-% IV SOLN
2.0000 g | Freq: Three times a day (TID) | INTRAVENOUS | Status: DC
  Administered 2021-09-03 – 2021-09-04 (×3): 2 g via INTRAVENOUS
  Filled 2021-09-03 (×3): qty 100

## 2021-09-03 MED ORDER — SODIUM CHLORIDE 0.9% FLUSH: 10.0000 mL | INTRAVENOUS | Status: DC | PRN

## 2021-09-03 MED ORDER — FAMOTIDINE IN NACL 20-0.9 MG/50ML-% IV SOLN
20.0000 mg | Freq: Two times a day (BID) | INTRAVENOUS | Status: AC
  Administered 2021-09-03 – 2021-09-04 (×2): 20 mg via INTRAVENOUS
  Filled 2021-09-03 (×2): qty 50

## 2021-09-03 MED ORDER — ACETAMINOPHEN 500 MG PO TABS
1000.0000 mg | ORAL_TABLET | Freq: Four times a day (QID) | ORAL | Status: DC
  Administered 2021-09-05: 1000 mg via ORAL
  Filled 2021-09-03: qty 2

## 2021-09-03 MED ORDER — HEPARIN SODIUM (PORCINE) 1000 UNIT/ML IJ SOLN
INTRAMUSCULAR | Status: AC
  Filled 2021-09-03: qty 1

## 2021-09-03 MED ORDER — LACTATED RINGERS IV SOLN: INTRAVENOUS | Status: DC

## 2021-09-03 MED ORDER — SODIUM CHLORIDE 0.9 % IV SOLN
20.0000 ug | Freq: Once | INTRAVENOUS | Status: AC
  Administered 2021-09-03: 20 ug via INTRAVENOUS
  Filled 2021-09-03: qty 5

## 2021-09-03 MED ORDER — MIDAZOLAM HCL 2 MG/2ML IJ SOLN
2.0000 mg | INTRAMUSCULAR | Status: DC | PRN
  Administered 2021-09-04 (×2): 2 mg via INTRAVENOUS
  Filled 2021-09-03 (×2): qty 2

## 2021-09-03 MED ORDER — ASPIRIN 81 MG PO CHEW
324.0000 mg | CHEWABLE_TABLET | Freq: Every day | ORAL | Status: DC
  Administered 2021-09-04: 324 mg
  Filled 2021-09-03: qty 4

## 2021-09-03 MED ORDER — PROPOFOL 10 MG/ML IV BOLUS
INTRAVENOUS | Status: DC | PRN
  Administered 2021-09-03: 60 mg via INTRAVENOUS

## 2021-09-03 MED ORDER — ASPIRIN 300 MG RE SUPP: 300.0000 mg | Freq: Every day | RECTAL | Status: DC

## 2021-09-03 MED ORDER — SODIUM CHLORIDE 0.9% FLUSH
10.0000 mL | Freq: Two times a day (BID) | INTRAVENOUS | Status: DC
  Administered 2021-09-04 – 2021-09-06 (×5): 10 mL
  Administered 2021-09-07: 20 mL
  Administered 2021-09-09 – 2021-09-16 (×11): 10 mL
  Administered 2021-09-17: 20 mL
  Administered 2021-09-17 – 2021-09-18 (×3): 10 mL
  Administered 2021-09-19: 30 mL
  Administered 2021-09-20: 10 mL

## 2021-09-03 MED ORDER — ONDANSETRON HCL 4 MG/2ML IJ SOLN
4.0000 mg | Freq: Four times a day (QID) | INTRAMUSCULAR | Status: DC | PRN
  Filled 2021-09-03 (×2): qty 2

## 2021-09-03 MED ORDER — CHLORHEXIDINE GLUCONATE 0.12 % MT SOLN
15.0000 mL | OROMUCOSAL | Status: AC
  Administered 2021-09-03: 15 mL via OROMUCOSAL

## 2021-09-03 MED ORDER — EPINEPHRINE HCL 5 MG/250ML IV SOLN IN NS
0.0000 ug/min | INTRAVENOUS | Status: DC
  Administered 2021-09-04 – 2021-09-05 (×4): 10 ug/min via INTRAVENOUS
  Administered 2021-09-05: 7 ug/min via INTRAVENOUS
  Administered 2021-09-06: 6 ug/min via INTRAVENOUS
  Filled 2021-09-03 (×7): qty 250

## 2021-09-03 MED ORDER — FENTANYL CITRATE (PF) 250 MCG/5ML IJ SOLN
INTRAMUSCULAR | Status: DC | PRN
  Administered 2021-09-03: 100 ug via INTRAVENOUS
  Administered 2021-09-03: 150 ug via INTRAVENOUS
  Administered 2021-09-03 (×4): 50 ug via INTRAVENOUS
  Administered 2021-09-03 (×2): 100 ug via INTRAVENOUS
  Administered 2021-09-03: 50 ug via INTRAVENOUS

## 2021-09-03 MED ORDER — SODIUM CHLORIDE 0.9% FLUSH
3.0000 mL | Freq: Two times a day (BID) | INTRAVENOUS | Status: DC
  Administered 2021-09-04 – 2021-09-06 (×4): 3 mL via INTRAVENOUS

## 2021-09-03 MED ORDER — ASPIRIN EC 325 MG PO TBEC
325.0000 mg | DELAYED_RELEASE_TABLET | Freq: Every day | ORAL | Status: DC
  Administered 2021-09-05: 325 mg via ORAL
  Filled 2021-09-03: qty 1

## 2021-09-03 MED ORDER — MIDAZOLAM HCL (PF) 10 MG/2ML IJ SOLN
INTRAMUSCULAR | Status: AC
  Filled 2021-09-03: qty 2

## 2021-09-03 MED ORDER — ACETAMINOPHEN 160 MG/5ML PO SOLN
1000.0000 mg | Freq: Four times a day (QID) | ORAL | Status: DC
  Administered 2021-09-03 – 2021-09-04 (×3): 1000 mg
  Filled 2021-09-03 (×3): qty 40.6

## 2021-09-03 MED ORDER — ACETAMINOPHEN 160 MG/5ML PO SOLN: 650.0000 mg | Freq: Once | ORAL | Status: AC

## 2021-09-03 MED ORDER — AMIODARONE LOAD VIA INFUSION
150.0000 mg | Freq: Once | INTRAVENOUS | Status: AC
  Administered 2021-09-03: 150 mg via INTRAVENOUS

## 2021-09-03 MED ORDER — LACTATED RINGERS IV SOLN: INTRAVENOUS | Status: DC | PRN

## 2021-09-03 MED ORDER — SODIUM CHLORIDE (PF) 0.9 % IJ SOLN
OROMUCOSAL | Status: DC | PRN
  Administered 2021-09-03 (×4): 4 mL via TOPICAL

## 2021-09-03 MED ORDER — INSULIN REGULAR(HUMAN) IN NACL 100-0.9 UT/100ML-% IV SOLN
INTRAVENOUS | Status: DC
  Administered 2021-09-03: 4.4 [IU]/h via INTRAVENOUS
  Administered 2021-09-04: 3.6 [IU]/h via INTRAVENOUS
  Filled 2021-09-03 (×2): qty 100

## 2021-09-03 MED ORDER — OXYCODONE HCL 5 MG PO TABS
5.0000 mg | ORAL_TABLET | ORAL | Status: DC | PRN
  Administered 2021-09-03: 10 mg via ORAL
  Filled 2021-09-03: qty 2

## 2021-09-03 MED ORDER — PROTAMINE SULFATE 10 MG/ML IV SOLN
INTRAVENOUS | Status: AC
  Filled 2021-09-03: qty 25

## 2021-09-03 MED ORDER — PROTAMINE SULFATE 10 MG/ML IV SOLN
INTRAVENOUS | Status: DC | PRN
  Administered 2021-09-03: 28 mg via INTRAVENOUS
  Administered 2021-09-03: 2 mg via INTRAVENOUS

## 2021-09-03 MED ORDER — MAGNESIUM SULFATE 4 GM/100ML IV SOLN
4.0000 g | Freq: Once | INTRAVENOUS | Status: AC
  Administered 2021-09-03: 4 g via INTRAVENOUS
  Filled 2021-09-03: qty 100

## 2021-09-03 MED ORDER — VASOPRESSIN 20 UNITS/100 ML INFUSION FOR SHOCK
0.0000 [IU]/min | INTRAVENOUS | Status: DC
  Administered 2021-09-03: 0.03 [IU]/min via INTRAVENOUS
  Administered 2021-09-04: 0.02 [IU]/min via INTRAVENOUS
  Filled 2021-09-03 (×2): qty 100

## 2021-09-03 MED ORDER — PROTAMINE SULFATE 10 MG/ML IV SOLN
INTRAVENOUS | Status: AC
  Filled 2021-09-03: qty 5

## 2021-09-03 MED ORDER — NOREPINEPHRINE 4 MG/250ML-% IV SOLN
0.0000 ug/min | INTRAVENOUS | Status: DC
  Administered 2021-09-03: 12 ug/min via INTRAVENOUS
  Filled 2021-09-03: qty 250

## 2021-09-03 MED ORDER — ORAL CARE MOUTH RINSE
15.0000 mL | OROMUCOSAL | Status: DC
  Administered 2021-09-03 – 2021-09-04 (×8): 15 mL via OROMUCOSAL

## 2021-09-03 MED ORDER — BISACODYL 5 MG PO TBEC: 10.0000 mg | DELAYED_RELEASE_TABLET | Freq: Every day | ORAL | Status: DC

## 2021-09-03 MED ORDER — MORPHINE SULFATE (PF) 2 MG/ML IV SOLN
1.0000 mg | INTRAVENOUS | Status: DC | PRN
  Administered 2021-09-03: 4 mg via INTRAVENOUS
  Administered 2021-09-03 (×2): 2 mg via INTRAVENOUS
  Administered 2021-09-03 – 2021-09-04 (×5): 4 mg via INTRAVENOUS
  Filled 2021-09-03: qty 1
  Filled 2021-09-03 (×4): qty 2
  Filled 2021-09-03: qty 1
  Filled 2021-09-03 (×2): qty 2

## 2021-09-03 MED ORDER — SODIUM CHLORIDE 0.9 % IV SOLN: INTRAVENOUS | Status: DC | PRN

## 2021-09-03 MED ORDER — NOREPINEPHRINE 16 MG/250ML-% IV SOLN
0.0000 ug/min | INTRAVENOUS | Status: DC
  Administered 2021-09-04: 15 ug/min via INTRAVENOUS
  Administered 2021-09-05: 8 ug/min via INTRAVENOUS
  Administered 2021-09-05: 6 ug/min via INTRAVENOUS
  Filled 2021-09-03 (×4): qty 250

## 2021-09-03 MED ORDER — SODIUM CHLORIDE 0.9 % IV SOLN: INTRAVENOUS | Status: DC

## 2021-09-03 MED ORDER — SODIUM CHLORIDE 0.9% IV SOLUTION: Freq: Once | INTRAVENOUS | Status: AC

## 2021-09-03 MED ORDER — COAGULATION FACTOR VIIA RECOMB 2 MG IV SOLR
2000.0000 ug | Freq: Once | INTRAVENOUS | Status: AC
  Administered 2021-09-03: 2000 ug via INTRAVENOUS
  Filled 2021-09-03: qty 2000

## 2021-09-03 MED ORDER — PHENYLEPHRINE 40 MCG/ML (10ML) SYRINGE FOR IV PUSH (FOR BLOOD PRESSURE SUPPORT)
PREFILLED_SYRINGE | INTRAVENOUS | Status: AC
  Filled 2021-09-03: qty 20

## 2021-09-03 MED ORDER — ROCURONIUM BROMIDE 10 MG/ML (PF) SYRINGE
PREFILLED_SYRINGE | INTRAVENOUS | Status: DC | PRN
  Administered 2021-09-03 (×2): 100 mg via INTRAVENOUS
  Administered 2021-09-03: 50 mg via INTRAVENOUS
  Administered 2021-09-03: 10 mg via INTRAVENOUS
  Administered 2021-09-03 (×2): 50 mg via INTRAVENOUS

## 2021-09-03 MED ORDER — SODIUM CHLORIDE 0.9% FLUSH: 3.0000 mL | INTRAVENOUS | Status: DC | PRN

## 2021-09-03 MED ORDER — SODIUM CHLORIDE 0.9 % IR SOLN
Status: DC | PRN
  Administered 2021-09-03: 1

## 2021-09-03 SURGICAL SUPPLY — 121 items
ADAPTER CARDIO PERF ANTE/RETRO (ADAPTER) IMPLANT
ADPR PRFSN 84XANTGRD RTRGD (ADAPTER)
AGENT HMST KT MTR STRL THRMB (HEMOSTASIS) ×2
ANTEGRADE CPLG (MISCELLANEOUS) IMPLANT
APL PRP STRL LF DISP 70% ISPRP (MISCELLANEOUS) ×2
BAG DECANTER FOR FLEXI CONT (MISCELLANEOUS) ×3 IMPLANT
BLADE CLIPPER SURG (BLADE) ×3 IMPLANT
BLADE STERNUM SYSTEM 6 (BLADE) ×3 IMPLANT
BLADE SURG 12 STRL SS (BLADE) ×3 IMPLANT
BLADE SURG 15 STRL LF DISP TIS (BLADE) IMPLANT
BLADE SURG 15 STRL SS (BLADE)
CANISTER SUCT 3000ML PPV (MISCELLANEOUS) ×3 IMPLANT
CANNULA ARTERIAL NVNT 3/8 20FR (MISCELLANEOUS) ×3 IMPLANT
CANNULA SUMP PERICARDIAL (CANNULA) ×3 IMPLANT
CANNULA VENOUS LOW PROF 34X46 (CANNULA) ×3 IMPLANT
CATH COUDE FOLEY 2W 5CC 16FR (CATHETERS) ×1 IMPLANT
CATH FOLEY 2WAY SLVR  5CC 14FR (CATHETERS) ×3
CATH FOLEY 2WAY SLVR 5CC 14FR (CATHETERS) ×2 IMPLANT
CATH HEART VENT LEFT (CATHETERS) IMPLANT
CATH ROBINSON RED A/P 18FR (CATHETERS) ×6 IMPLANT
CATH THORACIC 28FR (CATHETERS) IMPLANT
CATH THORACIC 28FR RT ANG (CATHETERS) ×1 IMPLANT
CHLORAPREP W/TINT 26 (MISCELLANEOUS) ×3 IMPLANT
CNTNR URN SCR LID CUP LEK RST (MISCELLANEOUS) IMPLANT
CONN ST 1/4X3/8  BEN (MISCELLANEOUS) ×3
CONN ST 1/4X3/8 BEN (MISCELLANEOUS) IMPLANT
CONT SPEC 4OZ STRL OR WHT (MISCELLANEOUS) ×3
CONTAINER PROTECT SURGISLUSH (MISCELLANEOUS) ×5 IMPLANT
DRAIN CHANNEL 28F RND 3/8 FF (WOUND CARE) ×1 IMPLANT
DRAIN CHANNEL 32F RND 10.7 FF (WOUND CARE) IMPLANT
DRAIN JP 15F RND RADIO PRF (DRAIN) ×1 IMPLANT
DRAPE CV SPLIT W-CLR ANES SCRN (DRAPES) ×3 IMPLANT
DRAPE INCISE IOBAN 66X45 STRL (DRAPES) ×7 IMPLANT
DRAPE PERI GROIN 82X75IN TIB (DRAPES) ×3 IMPLANT
DRAPE SLUSH/WARMER DISC (DRAPES) ×3 IMPLANT
DRSG AQUACEL AG ADV 3.5X14 (GAUZE/BANDAGES/DRESSINGS) ×3 IMPLANT
ELECT BLADE 4.0 EZ CLEAN MEGAD (MISCELLANEOUS) ×3
ELECT BLADE 6.5 EXT (BLADE) ×3 IMPLANT
ELECT CAUTERY BLADE 6.4 (BLADE) ×3 IMPLANT
ELECT REM PT RETURN 9FT ADLT (ELECTROSURGICAL) ×3
ELECTRODE BLDE 4.0 EZ CLN MEGD (MISCELLANEOUS) ×2 IMPLANT
ELECTRODE REM PT RTRN 9FT ADLT (ELECTROSURGICAL) ×2 IMPLANT
EVACUATOR SILICONE 100CC (DRAIN) ×2 IMPLANT
FELT TEFLON 6X6 (MISCELLANEOUS) ×3 IMPLANT
GAUZE 4X4 16PLY ~~LOC~~+RFID DBL (SPONGE) ×3 IMPLANT
GAUZE SPONGE 4X4 12PLY STRL (GAUZE/BANDAGES/DRESSINGS) ×6 IMPLANT
GLOVE SURG ENC MOIS LTX SZ7.5 (GLOVE) ×9 IMPLANT
GLOVE SURG SYN 7.5  E (GLOVE) ×3
GLOVE SURG SYN 7.5 E (GLOVE) ×2 IMPLANT
GLOVE SURG SYN 7.5 PF PI (GLOVE) IMPLANT
GOWN STRL REUS W/ TWL LRG LVL3 (GOWN DISPOSABLE) ×8 IMPLANT
GOWN STRL REUS W/ TWL XL LVL3 (GOWN DISPOSABLE) ×4 IMPLANT
GOWN STRL REUS W/TWL LRG LVL3 (GOWN DISPOSABLE) ×21
GOWN STRL REUS W/TWL XL LVL3 (GOWN DISPOSABLE) ×12
HANDLE STAPLE ENDO GIA SHORT (STAPLE) ×1
HEMOSTAT POWDER SURGIFOAM 1G (HEMOSTASIS) ×12 IMPLANT
HEMOSTAT SURGICEL 2X14 (HEMOSTASIS) IMPLANT
INSERT FOGARTY XLG (MISCELLANEOUS) IMPLANT
KIT BASIN OR (CUSTOM PROCEDURE TRAY) ×3 IMPLANT
KIT LVAD HEARTMATE 3 W-CNTRL (Prosthesis & Implant Heart) IMPLANT
KIT LVAD HEARTMATE III W-CNTRL (Prosthesis & Implant Heart) ×1 IMPLANT
KIT SUCTION CATH 14FR (SUCTIONS) ×3 IMPLANT
KIT TURNOVER KIT B (KITS) ×3 IMPLANT
LEAD PACING MYOCARDI (MISCELLANEOUS) IMPLANT
LINE VENT (MISCELLANEOUS) ×1 IMPLANT
NDL SUT 1 .5 CRC FRENCH EYE (NEEDLE) IMPLANT
NEEDLE FRENCH EYE (NEEDLE) ×3
NS IRRIG 1000ML POUR BTL (IV SOLUTION) ×14 IMPLANT
PACK OPEN HEART (CUSTOM PROCEDURE TRAY) ×3 IMPLANT
PAD ARMBOARD 7.5X6 YLW CONV (MISCELLANEOUS) ×6 IMPLANT
PAD DEFIB R2 (MISCELLANEOUS) ×3 IMPLANT
PENCIL BUTTON HOLSTER BLD 10FT (ELECTRODE) ×1 IMPLANT
POSITIONER HEAD DONUT 9IN (MISCELLANEOUS) ×3 IMPLANT
POWDER SURGICEL 3.0 GRAM (HEMOSTASIS) ×1 IMPLANT
PUNCH AORTIC ROTATE 4.5MM 8IN (MISCELLANEOUS) ×3 IMPLANT
RELOAD TRI 2.0 30 VAS MED SUL (STAPLE) ×1 IMPLANT
SEALANT SURG COSEAL 8ML (VASCULAR PRODUCTS) ×1 IMPLANT
SET MPS 3-ND DEL (MISCELLANEOUS) ×1 IMPLANT
SHEATH AVANTI 11CM 5FR (SHEATH) IMPLANT
SPONGE T-LAP 18X18 ~~LOC~~+RFID (SPONGE) ×12 IMPLANT
SPONGE T-LAP 4X18 ~~LOC~~+RFID (SPONGE) ×5 IMPLANT
STAPLER ENDO GIA 12 SHRT THIN (STAPLE) IMPLANT
STAPLER ENDO GIA 12MM SHORT (STAPLE) ×2 IMPLANT
SURGIFLO W/THROMBIN 8M KIT (HEMOSTASIS) ×1 IMPLANT
SUT ETHIBOND 2 0 SH (SUTURE) ×15
SUT ETHIBOND 2 0 SH 36X2 (SUTURE) ×10 IMPLANT
SUT ETHIBOND NAB MH 2-0 36IN (SUTURE) ×41 IMPLANT
SUT ETHILON 3 0 FSL (SUTURE) ×2 IMPLANT
SUT ETHILON 3 0 PS 1 (SUTURE) ×1 IMPLANT
SUT PROLENE 3 0 RB 1 (SUTURE) IMPLANT
SUT PROLENE 3 0 SH 48 (SUTURE) ×1 IMPLANT
SUT PROLENE 3 0 SH DA (SUTURE) ×7 IMPLANT
SUT PROLENE 4 0 RB 1 (SUTURE) ×33
SUT PROLENE 4 0 SH DA (SUTURE) ×3 IMPLANT
SUT PROLENE 4-0 RB1 .5 CRCL 36 (SUTURE) ×8 IMPLANT
SUT PROLENE 5 0 C1 (SUTURE) IMPLANT
SUT PROLENE 6 0 C 1 30 (SUTURE) ×4 IMPLANT
SUT SILK  1 MH (SUTURE) ×15
SUT SILK 1 MH (SUTURE) ×8 IMPLANT
SUT SILK 1 TIES 10X30 (SUTURE) ×3 IMPLANT
SUT SILK 2 0 SH CR/8 (SUTURE) ×7 IMPLANT
SUT STEEL 6MS V (SUTURE) ×4 IMPLANT
SUT STEEL SZ 6 DBL 3X14 BALL (SUTURE) ×3 IMPLANT
SUT TEM PAC WIRE 2 0 SH (SUTURE) IMPLANT
SUT VIC AB 1 CTX 36 (SUTURE) ×6
SUT VIC AB 1 CTX36XBRD ANBCTR (SUTURE) ×4 IMPLANT
SUT VIC AB 2-0 CTX 27 (SUTURE) ×6 IMPLANT
SUT VIC AB 3-0 SH 8-18 (SUTURE) ×3 IMPLANT
SUT VIC AB 3-0 X1 27 (SUTURE) ×7 IMPLANT
SYR 50ML LL SCALE MARK (SYRINGE) ×3 IMPLANT
SYSTEM SAHARA CHEST DRAIN ATS (WOUND CARE) ×3 IMPLANT
TAPE CLOTH SOFT 2X10 (GAUZE/BANDAGES/DRESSINGS) ×1 IMPLANT
TAPE CLOTH SURG 4X10 WHT LF (GAUZE/BANDAGES/DRESSINGS) ×2 IMPLANT
TOWEL GREEN STERILE (TOWEL DISPOSABLE) ×3 IMPLANT
TRAY CATH LUMEN 1 20CM STRL (SET/KITS/TRAYS/PACK) ×3 IMPLANT
TRAY FOLEY SLVR 16FR TEMP STAT (SET/KITS/TRAYS/PACK) ×2 IMPLANT
TUBE CONNECTING 12X1/4 (SUCTIONS) ×3 IMPLANT
UNDERPAD 30X36 HEAVY ABSORB (UNDERPADS AND DIAPERS) ×3 IMPLANT
VENT LEFT HEART 12002 (CATHETERS)
WATER STERILE IRR 1000ML POUR (IV SOLUTION) ×6 IMPLANT
YANKAUER SUCT BULB TIP NO VENT (SUCTIONS) ×3 IMPLANT

## 2021-09-03 NOTE — Progress Notes (Signed)
?  Echocardiogram ?Echocardiogram Transesophageal has been performed. ? ?[name] ?09/03/2021, 12:51 PM ?

## 2021-09-03 NOTE — Progress Notes (Signed)
Pharmacy Antibiotic Note ? ?CAMEO SCHMIESING is a 51 y.o. female admitted on 08/25/2021 with surgical prophylaxis.  Pharmacy has been consulted for Vancomycin dosing. ? ?Scr 0.8 mg/dL ? ?Plan: ?Vancomycin '1000mg'$  q12hr x48hr ?No levels needed with short course.  ?Will monitor for acute changes in renal function and adjust as needed ? ?Height: '5\' 3"'$  (160 cm) ?Weight: 108.3 kg (238 lb 12.1 oz) ?IBW/kg (Calculated) : 52.4 ? ?Temp (24hrs), Avg:98.3 ?F (36.8 ?C), Min:97.7 ?F (36.5 ?C), Max:98.6 ?F (37 ?C) ? ?Recent Labs  ?Lab 08/30/21 ?0252 08/30/21 ?1645 08/31/21 ?0330 08/31/21 ?1641 09/01/21 ?0331 09/02/21 ?0311 09/03/21 ?0355 09/03/21 ?9741 09/03/21 ?1035 09/03/21 ?1119 09/03/21 ?1213 09/03/21 ?1312 09/03/21 ?1358  ?WBC 21.0*  --  22.4*  --  23.0* 24.5* 26.0*  --   --   --   --   --   --   ?CREATININE 1.37*   < > 1.10*   < > 1.00 0.91 0.78   < > 0.80 0.80 0.80 0.80 0.80  ? < > = values in this interval not displayed.  ?  ?Estimated Creatinine Clearance: 99.3 mL/min (by C-G formula based on SCr of 0.8 mg/dL).   ? ?No Known Allergies ? ?Thank you for allowing pharmacy to be a part of this patient?s care. ? ?Donnald Garre, PharmD ?Clinical Pharmacist ? ?Please check AMION for all [place] numbers ?After 10:00 PM, call [place] [phone] ? ? ?

## 2021-09-03 NOTE — Progress Notes (Signed)
ANTICOAGULATION CONSULT NOTE ? ?Pharmacy Consult for heparin ?Indication:  impella 5.5 ? ?No Known Allergies ? ?Patient Measurements: ?Height: '5\' 3"'$  (160 cm) ?Weight: 108.3 kg (238 lb 12.1 oz) ?IBW/kg (Calculated) : 52.4 ?Heparin Dosing Weight: 76.1 kg ? ?Vital Signs: ?Temp: 97.7 ?F (36.5 ?C) (03/14 0700) ?Pulse Rate: 98 (03/14 0700) ? ?Labs: ?Recent Labs  ?  09/01/21 ?0331 09/01/21 ?0341 09/02/21 ?0311 09/02/21 ?0619 09/02/21 ?1517 09/03/21 ?0420 09/03/21 ?3299 09/03/21 ?2426 09/03/21 ?8341 09/03/21 ?1035  ?HGB 9.1*   < > 9.7*   < >  --    < > 9.5* 10.9* 11.6* 9.9*  ?HCT 30.8*   < > 32.8*   < >  --    < > 32.3* 32.0* 34.0* 29.0*  ?PLT 165  --  150  --   --   --  125*  --   --   --   ?LABPROT 15.2  --   --   --   --   --   --   --   --   --   ?INR 1.2  --   --   --   --   --   --   --   --   --   ?HEPARINUNFRC 0.26*   < > 0.21*  --  0.27*  --  0.20*  --   --   --   ?CREATININE 1.00  --  0.91  --   --   --  0.78  --  0.80 0.80  ? < > = values in this interval not displayed.  ? ? ? ?Estimated Creatinine Clearance: 99.3 mL/min (by C-G formula based on SCr of 0.8 mg/dL). ? ? ?Medical History: ?Past Medical History:  ?Diagnosis Date  ? Automatic implantable cardioverter-defibrillator in situ   ? Chronic CHF (congestive heart failure) (HCC)   ? a. EF 15-20% b. RHC (09/2013) RA 14, RV 57/22, PA 64/36 (48), PCWP 18, FIck CO/CI 3.7/1.6, PVR 8.1 WU, PA sat 47%   ? History of stomach ulcers   ? Hypertension   ? Hypotension   ? Hypothyroidism   ? Morbid obesity ([place])   ? Myocardial infarction [facility]) 08/2013  ? Nocturnal dyspnea   ? Nonischemic cardiomyopathy ([place])   ? Sinus tachycardia   ? Sleep apnea   ? Snoring-prob OSA 09/04/2011  ? [employer] ICD lead RECALL  [phone]   ? UARS (upper airway resistance syndrome) 09/04/2011  ? HST 12/2013:  AHI 4/hr (numerous episodes of airflow reduction that did not have concomitant desaturation)   ? ? ?Medications:  ?Infusions:  ? sodium chloride    ? [MAR Hold] sodium chloride    ? [MAR Hold]  sodium chloride    ? [facility] Hold] sodium chloride 10 mL/hr at 09/03/21 0700  ? sodium chloride    ? sodium chloride    ? amiodarone 30 mg/hr (09/03/21 0920)  ?  ceFAZolin (ANCEF) IV    ? dexmedetomidine    ? DOBUTamine    ? DOPamine    ? feeding supplement (VITAL 1.5 CAL) 1,000 mL (09/02/21 1503)  ? heparin 30,000 units/NS 1000 mL solution for CELLSAVER    ? heparin 50 units/mL (Impella PURGE) in dextrose 5 % 1000 mL bag 50 mL (09/01/21 0719)  ? milrinone 0.25 mcg/kg/min (09/03/21 0920)  ? milrinone    ? nitroGLYCERIN    ? vancomycin    ? ? ?Assessment: ?51 yo female s/p Impella 5.5 placement on 08/29/21. No PTA anticoagulation or  indication for [facility] other than Impella.  ? ?Heparin level is therapeutic at 0.2, on heparin purge solution only (purge flow at 11.1 mL/hr - 555 units/hr). Systemic heparin stopped at 0400 in preparation for OR.  Hgb down to 9.5 and platelets down from 150 to 125. No s/sx of bleeding or infusion issues. ? ?Goal of Therapy:  ?Heparin level 0.2-0.5 units/ml ?Monitor platelets by anticoagulation protocol: Yes ?  ?Plan:  ?-Continue heparin through impella purge  ?-Heparin level q12h and CBC daily ?-Monitor for s/x bleeding ? ?Cathrine Muster, PharmD ?PGY2 Cardiology Pharmacy Resident ?Phone: [phone] ?09/03/2021  11:16 AM ? ?Please check [email or web address] for unit-specific pharmacy phone numbers. ? ?

## 2021-09-03 NOTE — Progress Notes (Signed)
CSW met with patients husband in the waiting room. He states he is doing well but feeling the stress of the day. He has been in touch with both adult children to provide updates throughout the day. Husband grateful for the updates from the VAD Coordinator and will wait to see patient after she is settled in the ICU. CSW will continue to follow throughout implant hospitalization. Raquel Sarna, [place], [place] ? ?

## 2021-09-03 NOTE — Anesthesia Procedure Notes (Addendum)
Procedure Name: Intubation ?Date/Time: 09/03/2021 8:25 AM ?Performed by: Theresa Mulligan, RN ?Pre-anesthesia Checklist: Patient identified, Emergency Drugs available, Suction available and Patient being monitored ?Patient Re-evaluated:Patient Re-evaluated prior to induction ?Oxygen Delivery Method: Circle System Utilized ?Preoxygenation: Pre-oxygenation with 100% oxygen ?Induction Type: IV induction ?Ventilation: Mask ventilation without difficulty ?Laryngoscope Size: Mac and 3 ?Grade View: Grade I ?Tube type: Oral ?Tube size: 8.0 mm ?Number of attempts: 1 ?Airway Equipment and Method: Stylet ?Placement Confirmation: ETT inserted through vocal cords under direct vision, positive ETCO2 and breath sounds checked- equal and bilateral ?Secured at: 23 cm ?Tube secured with: Tape ?Dental Injury: Teeth and Oropharynx as per pre-operative assessment  ? ? ? ? ?

## 2021-09-03 NOTE — Anesthesia Preprocedure Evaluation (Signed)
Anesthesia Evaluation  ?Patient identified by MRN, date of birth, ID band ?Patient awake ? ? ? ?Reviewed: ?Allergy & Precautions, NPO status , Patient's Chart, lab work & pertinent test results ? ?History of Anesthesia Complications ?Negative for: history of anesthetic complications ? ?Airway ?Mallampati: III ? ?TM Distance: >3 FB ?Neck ROM: Full ? ? ? Dental ? ?(+) Teeth Intact, Dental Advisory Given ?  ?Pulmonary ?shortness of breath, sleep apnea ,  ?  ?breath sounds clear to auscultation ? ? ? ? ? ? Cardiovascular ?hypertension, (-) angina+ Past MI and +CHF  ?+ Cardiac Defibrillator ? ?Rhythm:Regular  ??1. Left ventricular ejection fraction, by estimation, is <20%. The left  ?ventricle has severely decreased function. The left ventricle demonstrates  ?global hypokinesis. The left ventricular internal cavity size was severely  ?dilated. No LV thrombus noted.  ?Indeterminate diastolic filling due to E-A fusion.  ??2. Right ventricular systolic function is mildly reduced. The right  ?ventricular size is normal. There is mildly elevated pulmonary artery  ?systolic pressure. The estimated right ventricular systolic pressure is  ?30.8 mmHg.  ??3. Left atrial size was severely dilated.  ??4. Right atrial size was mildly dilated.  ??5. The mitral valve is abnormal. Moderate-severe mitral valve  ?regurgitation, ERO 0.22 cm^2 by PISA but looks worse visually. Suspect  ?functional MR. No evidence of mitral stenosis.  ??6. The aortic valve is tricuspid. Aortic valve regurgitation is not  ?visualized. No aortic stenosis is present.  ??7. The inferior vena cava is dilated in size with <50% respiratory  ?variability, suggesting right atrial pressure of 15 mmHg.  ? ?[employer] AICD ?  ?Neuro/Psych ? 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 GI/Hepatic ?negative GI ROS, Neg liver ROS,   ?Endo/Other  ?Hypothyroidism  ? Renal/GU ?CRFRenal diseaseLab Results ?     Component                Value                Date                 ?     CREATININE               0.78                09/03/2021           ?  ? ?  ?Musculoskeletal ? ? Abdominal ?  ?Peds ? Hematology ? ?(+) Blood dyscrasia, anemia , Lab Results ?     Component                Value               Date                 ?     WBC                      26.0 (H)            09/03/2021           ?     HGB                      9.5 (L)             09/03/2021           ?     HCT  32.3 (L)            09/03/2021           ?     MCV                      79.0 (L)            09/03/2021           ?     PLT                      125 (L)             09/03/2021           ? ?   ?Anesthesia Other Findings ?inpella in place ? Reproductive/Obstetrics ? ?  ? ? ? ? ? ? ? ? ? ? ? ? ? ?  ?  ? ? ? ? ? ? ? ? ?Anesthesia Physical ?Anesthesia Plan ? ?ASA: 4 ? ?Anesthesia Plan: General  ? ?Post-op Pain Management:   ? ?Induction: Intravenous ? ?PONV Risk Score and Plan: 3 and Treatment may vary due to age or medical condition ? ?Airway Management Planned: Oral ETT ? ?Additional Equipment: Arterial line, CVP, PA Cath, TEE and Ultrasound Guidance Line Placement ? ?Intra-op Plan:  ? ?Post-operative Plan: Post-operative intubation/ventilation ? ?Informed Consent: I have reviewed the patients History and Physical, chart, labs and discussed the procedure including the risks, benefits and alternatives for the proposed anesthesia with the patient or authorized representative who has indicated his/her understanding and acceptance.  ? ? ? ?Dental advisory given ? ?Plan Discussed with: CRNA and Anesthesiologist ? ?Anesthesia Plan Comments:   ? ? ? ? ? ? ?Anesthesia Quick Evaluation ? ?

## 2021-09-03 NOTE — Brief Op Note (Signed)
08/25/2021 - 09/03/2021 ? ?1:24 PM ? ?PATIENT:  [name]  51 y.o. female ? ?PRE-OPERATIVE DIAGNOSIS:  CONGESTIVE HEART FAILURE ? ?POST-OPERATIVE DIAGNOSIS:  CONGESTIVE HEART FAILURE ? ?PROCEDURE:  Procedure(s): ? ?INSERTION OF IMPLANTABLE LEFT VENTRICULAR ASSIST DEVICE/ Heartmate 3 (N/A) ? ?TRANSESOPHAGEAL ECHOCARDIOGRAM (TEE) (N/A) ? ?REMOVAL OF IMPELLA LEFT VENTRICULAR ASSIST DEVICE (N/A) ? ?SURGEON:  Surgeon(s) and Role: ?   Dahlia Byes, MD - Primary ? ?PHYSICIAN ASSISTANT: Ellwood Handler PA-C ? ?ASSISTANTS: Ara Kussmaul RNFA  ? ?ANESTHESIA:   general ? ?EBL:  500 cc ? ?BLOOD ADMINISTERED: 4 units CC PRBC, 300 CC CELLSAVER, 4 FFP, and 2 PLTS ? ?DRAINS:  Mediastinal Chest Drains   ? ?LOCAL MEDICATIONS USED:  NONE ? ?SPECIMEN:  Source of Specimen:  LV Core ? ?DISPOSITION OF SPECIMEN:  PATHOLOGY ? ?COUNTS:  YES ? ? ?DICTATION: .Dragon Dictation ? ?PLAN OF CARE: Admit to inpatient  ? ?PATIENT DISPOSITION:  ICU - intubated and critically ill. ?  ?Delay start of Pharmacological VTE agent (>24hrs) due to surgical blood loss or risk of bleeding: no ? ?

## 2021-09-03 NOTE — Anesthesia Procedure Notes (Signed)
Arterial Line Insertion ?Start/End3/14/2023 8:19 AM, 09/03/2021 8:25 AM ?Performed by: Oleta Mouse, MD ? Patient location: OR. ?Preanesthetic checklist: patient identified, IV checked, site marked, risks and benefits discussed, surgical consent, monitors and equipment checked, pre-op evaluation, timeout performed and anesthesia consent ?Patient sedated ?Left, brachial was placed ?Hand hygiene performed  and maximum sterile barriers used  ? ?Attempts: 1 ?Procedure performed using ultrasound guided technique. ?Ultrasound Notes:anatomy identified, needle tip was noted to be adjacent to the nerve/plexus identified, no ultrasound evidence of intravascular and/or intraneural injection and image(s) printed for medical record ?Following insertion, dressing applied and Biopatch. ?Post procedure assessment: normal and unchanged ? ?Patient tolerated the procedure well with no immediate complications. ?Additional procedure comments: 60f micropuncture catheter. ? ? ? ? ?

## 2021-09-03 NOTE — Plan of Care (Signed)
?  Problem: Education: ?Goal: Knowledge of the prescribed therapeutic regimen will improve ?Outcome: Progressing ?  ?Problem: Cardiac: ?Goal: Ability to maintain an adequate cardiac output will improve ?Outcome: Progressing ?  ?Problem: Clinical Measurements: ?Goal: Will remain free from infection ?Outcome: Progressing ?  ?

## 2021-09-03 NOTE — Anesthesia Procedure Notes (Signed)
Central Venous Catheter Insertion ?Performed by: Oleta Mouse, MD, anesthesiologist ?Start/End3/14/2023 8:39 AM, 09/03/2021 8:49 AM ?Patient location: OR. ?Preanesthetic checklist: patient identified, IV checked, site marked, risks and benefits discussed, surgical consent, monitors and equipment checked, pre-op evaluation, timeout performed and anesthesia consent ?Hand hygiene performed  and maximum sterile barriers used  ?PA cath was placed.Swan type:thermodilution ?Procedure performed without using ultrasound guided technique. ?Attempts: 1 ?Patient tolerated the procedure well with no immediate complications. ? ? ? ?

## 2021-09-03 NOTE — Anesthesia Procedure Notes (Signed)
Central Venous Catheter Insertion ?Performed by: Oleta Mouse, MD, anesthesiologist ?Start/End3/14/2023 8:39 AM, 09/03/2021 8:49 AM ?Patient location: OR. ?Preanesthetic checklist: patient identified, IV checked, site marked, risks and benefits discussed, surgical consent, monitors and equipment checked, pre-op evaluation, timeout performed and anesthesia consent ?Position: supine ?Patient sedated ?Hand hygiene performed  and maximum sterile barriers used  ?Catheter size: 9 Fr ?Total catheter length 10. ?MAC introducer ?Procedure performed using ultrasound guided technique. ?Ultrasound Notes:anatomy identified, needle tip was noted to be adjacent to the nerve/plexus identified, no ultrasound evidence of intravascular and/or intraneural injection and image(s) printed for medical record ?Attempts: 1 ?Following insertion, line sutured, dressing applied and Biopatch. ?Post procedure assessment: blood return through all ports, free fluid flow and no air ? ?Patient tolerated the procedure well with no immediate complications. ? ? ? ? ?

## 2021-09-03 NOTE — Progress Notes (Signed)
Patient ID: [name], female   DOB: [birth year], 51 y.o.   MRN: [id number] ?  ? ? Advanced Heart Failure Rounding Note ? ?PCP-Cardiologist: [name], MD  ? ?Subjective:   ? ?3/6 Admitted w/ a/c systolic heart failure, NYHA Class IIIb-IV w/ low output, Started on empiric milrinone 0.25 + lasix gtt. Amiodarone gtt started for frequent NSVT. VAD W/u begun ?3/6 Tx for hyperthyroidism. TSH undetectable. Free T4 5.11. Started on Methimazole + prednisone. Levothyroxine d/c  ?3/7 CT abdomen/pelvis showed left ovarian cystic mass, pelvic US showed complex cystic left ovarian mass.  Unable to get MRI with ICD.  ?3/8 RHC on 0.25 of Milrinone: RA 15, PCWP 37, PA Sat 57%, FICK CI 1.96, Thermo CI 1.12, PAPi 1.33, PVR 4. Milrinone increased to 0.375. Lasix gtt resumed  ?03/09 Impella 5.5 placed, milrinone decreased to 0.125, neo titrated off ? ?Impella at P 8, Flow 4.8 L/min Waveforms ok. No alarms.  Heparin ongoing. Plts lower at 125.  ? ?Milrinone 0.25 mcg/kg/min.  She had 1 dose of Lasix 60 mg IV yesterday with I/Os net negative 3197. Creatinine stable at 0.78.  ? ?Walked unit yesterday without oxygen.   ? ?No VT. Remains on amiodarone gtt.  ? ?Has remained on steroids for hyperthyroidism.  ? ?[name] #s (shot personally) ?CVP 7 ?PA 45/28 ?CI 2.5 ?PAPi 2.4 ?LDH 367 -> 492 -> 510 -> 570 -> 537 ?Co-ox 76% ? ?RHC 3/8 (on Milrinone 0.25)  ?RA mean 15 ?RV 63/20 ?PA 64/44, mean 53 ?PCWP mean 37 ?PAPI 1.33  ?Oxygen saturations: ?PA 52% ?AO 100% ?Cardiac Output (Fick) 3.97  ?Cardiac Index (Fick) 1.96  ?PVR 4 WU ?Cardiac Output (thermo) 2.27 ?Cardiac Index (thermo) 1.12 ? ?Objective:   ?Weight Range: ?108.3 kg ?Body mass index is 42.29 kg/m?.  ? ?Vital Signs:   ?Temp:  [97.7 ?F (36.5 ?C)-98.6 ?F (37 ?C)] 97.7 ?F (36.5 ?C) (03/14 0700) ?Pulse Rate:  [87-101] 98 (03/14 0700) ?Resp:  [10-25] 20 (03/14 0700) ?SpO2:  [89 %-99 %] 89 % (03/14 0700) ?Arterial Line BP: (78-111)/(61-88) 86/65 (03/14 0700) ?Weight:  [108.3 kg] 108.3 kg (03/14  0500) ?Last BM Date : 09/01/21 ? ?Weight change: ?Filed Weights  ? 08/31/21 0456 09/02/21 0500 09/03/21 0500  ?Weight: 101.4 kg 101.2 kg 108.3 kg  ? ? ?Intake/Output:  ? ?Intake/Output Summary (Last 24 hours) at 09/03/2021 0713 ?Last data filed at 09/03/2021 0700 ?Gross per 24 hour  ?Intake 2319.3 ml  ?Output 5505 ml  ?Net -3185.7 ml  ?  ? ? ?Physical Exam  ?  ?General: NAD ?Neck: No JVD, no thyromegaly or thyroid nodule.  ?Lungs: Clear to auscultation bilaterally with normal respiratory effort. ?CV: Lateral PMI.  Heart regular S1/S2, no S3/S4, Impella sounds.  No peripheral edema.   ?Abdomen: Soft, nontender, no hepatosplenomegaly, no distention.  ?Skin: Intact without lesions or rashes.  ?Neurologic: Alert and oriented x 3.  ?Psych: Normal affect. ?Extremities: No clubbing or cyanosis.  ?HEENT: Normal.  ? ? ?Telemetry  ? ?NSR 90s. Personally reviewed ? ?Labs  ?  ?CBC ?Recent Labs  ?  09/02/21 ?0311 09/02/21 ?0619 09/03/21 ?0420 09/03/21 ?5361  ?WBC 24.5*  --   --  26.0*  ?HGB 9.7*   < > 11.2* 9.5*  ?HCT 32.8*   < > 33.0* 32.3*  ?MCV 79.0*  --   --  79.0*  ?PLT 150  --   --  125*  ? < > = values in this interval not displayed.  ? ?Basic Metabolic Panel ?  Recent Labs  ?  09/01/21 ?1637 09/02/21 ?0311 09/02/21 ?0619 09/03/21 ?0420 09/03/21 ?3818  ?NA  --  135   < > 132* 132*  ?K  --  3.7   < > 4.2 4.1  ?CL  --  94*  --   --  94*  ?CO2  --  33*  --   --  29  ?GLUCOSE  --  101*  --   --  94  ?BUN  --  25*  --   --  25*  ?CREATININE  --  0.91  --   --  0.78  ?CALCIUM  --  9.1  --   --  9.2  ?MG 2.3 2.3  --   --  1.9  ?PHOS 4.3 4.6  --   --   --   ? < > = values in this interval not displayed.  ? ?Liver Function Tests ?Recent Labs  ?  09/01/21 ?0331  ?AST 76*  ?ALT 65*  ?ALKPHOS 104  ?BILITOT 1.0  ?PROT 5.7*  ?ALBUMIN 2.7*  ? ? ?No results for input(s): LIPASE, AMYLASE in the last 72 hours. ?Cardiac Enzymes ?No results for input(s): CKTOTAL, CKMB, CKMBINDEX, TROPONINI in the last 72 hours. ? ?BNP: ?BNP (last 3  results) ?Recent Labs  ?  07/18/21 ?1647 08/21/21 ?1208 08/25/21 ?1728  ?BNP 1,331.6* 957.7* 1,117.0*  ? ? ?ProBNP (last 3 results) ?No results for input(s): PROBNP in the last 8760 hours. ? ? ?D-Dimer ?No results for input(s): DDIMER in the last 72 hours. ? ?Hemoglobin A1C ?No results for input(s): HGBA1C in the last 72 hours. ? ?Fasting Lipid Panel ?No results for input(s): CHOL, HDL, LDLCALC, TRIG, CHOLHDL, LDLDIRECT in the last 72 hours. ? ?Thyroid Function Tests ?Recent Labs  ?  09/01/21 ?0331  ?TSH <0.010*  ?T3FREE 3.6  ? ? ? ?Other results: ? ? ?Imaging  ? ? ?Port CXR ? ?Result Date: 09/02/2021 ?CLINICAL DATA:  Central line placement EXAM: PORTABLE CHEST 1 VIEW COMPARISON:  09/01/2021 at 6:18 a.m. FINDINGS: Single frontal view of the chest demonstrates enteric catheter passing below diaphragm tip excluded by collimation. A flow directed right internal jugular central catheter tip overlies the proximal right lower lobe pulmonary artery. Right-sided PICC tip projects over the superior vena cava. Left-sided AICD unchanged. LVAD via right upper extremity approach tip projects over the left ventricle, minimally retracted since prior study. Continued enlargement of the cardiac silhouette. No airspace disease, effusion, or pneumothorax. No acute bony abnormalities. IMPRESSION: 1. Support devices as above. 2. No acute airspace disease. 3. Stable cardiomegaly. Electronically Signed   By: Randa Ngo M.D.   On: 09/02/2021 15:44  ? ?ECHOCARDIOGRAM LIMITED ? ?Result Date: 09/02/2021 ?   ECHOCARDIOGRAM LIMITED REPORT   Patient Name:   [name] Date of Exam: 09/02/2021 Medical Rec #:  [id number]    Height:       63.0 in Accession #:    [id number]   Weight:       223.1 lb Date of Birth:  1970-11-10    BSA:          2.026 m? Patient Age:    63 years     BP:           87/45 mmHg Patient Gender: F            HR:           98 bpm. Exam Location:  Inpatient Procedure: Limited Echo Indications:    Impella Placement;  CHF-Acute  Systolic W73.71  History:        Patient has prior history of Echocardiogram examinations, most                 recent 08/29/2021. CHF and Cardiomyopathy, Previous Myocardial                 Infarction, Defibrillator; Risk Factors:Hypertension.  Sonographer:    Maudry Mayhew RDMS, RVT, RDCS Referring Phys: [place]  1. Left ventricular ejection fraction, by estimation, is <20%. The left ventricle has severely decreased function. The left ventricle demonstrates global hypokinesis. Impella Catheter present in LV. Echo done for placement with final tip position 4.57cm  from aortic valve. FINDINGS  Left Ventricle: Impella Catheter present in LV. Echo done for placement with final tip position 4.57cm from aortic valve. Left ventricular ejection fraction, by estimation, is <20%. The left ventricle has severely decreased function. The left ventricle demonstrates global hypokinesis. Fransico Him MD Electronically signed by Fransico Him MD Signature Date/Time: 09/02/2021/10:10:39 AM    Final    ? ? ?Medications:   ? ? ?Scheduled Medications: ? (feeding supplement) PROSource Plus  60 mL Oral TID BM  ? aspirin EC  81 mg Oral Daily  ? Chlorhexidine Gluconate Cloth  6 each Topical Daily  ? digoxin  0.0625 mg Oral Daily  ? epinephrine  0-10 mcg/min Intravenous To OR  ? feeding supplement (PROSource TF)  45 mL Per Tube Daily  ? insulin aspart  0-15 Units Subcutaneous TID WC  ? insulin aspart  0-5 Units Subcutaneous QHS  ? insulin   Intravenous To OR  ? magnesium sulfate  40 mEq Other To OR  ? methimazole  10 mg Oral Daily  ? methimazole  5 mg Oral QPM  ? methylPREDNISolone (SOLU-MEDROL) injection  80 mg Intravenous Once  ? multivitamin with minerals  1 tablet Oral Daily  ? phenylephrine  0-100 mcg/min Intravenous To OR  ? polyethylene glycol  17 g Oral Daily  ? potassium chloride  80 mEq Other To OR  ? quiniDINE gluconate  324 mg Oral BID  ? rosuvastatin  40 mg Oral QHS  ? senna-docusate  2 tablet Oral  BID  ? sodium chloride flush  10-40 mL Intracatheter Q12H  ? sodium chloride flush  3 mL Intravenous Q12H  ? sodium chloride flush  3 mL Intravenous Q12H  ? spironolactone  25 mg Oral Daily  ? tranexamic acid  15 mg/k

## 2021-09-03 NOTE — Op Note (Signed)
NAME: Leitch, Natash M. ?MEDICAL RECORD NO: [id number] ?ACCOUNT NO: [id number] ?DATE OF BIRTH: 10-13-1970 ?FACILITY: [facility] ?LOCATION: [facility] ?PHYSICIAN: Len Childs, MD ? ?Operative Report  ? ?DATE OF PROCEDURE: 09/03/2021 ? ?OPERATION: ?1.  Implantation of HeartMate 3 left ventricular assist device. ?2.  Removal of Impella 5.5 temporary percutaneous left ventricular assist device. ? ?PREOPERATIVE DIAGNOSES:  Nonischemic cardiomyopathy, acute on chronic systolic heart failure, cardiogenic shock. ? ?POSTOPERATIVE DIAGNOSES:  Nonischemic cardiomyopathy, acute on chronic systolic heart failure, cardiogenic shock. ? ?SURGEON:  Len Childs, MD ? ?ASSISTANT:  Ellwood Handler, PA-C.  A surgical first assistant was needed for the complexity of this operation and to meet the standard of surgical care.  The surgical first assistant was instrumental in providing exposure, suctioning, suture management  ?for the HeartMate 3 implantation, and general assistance. ? ?ANESTHESIA:  General by Dr. Harvest Dark. ? ?CLINICAL NOTE:  The patient is a 51 year old female followed in the advanced heart failure clinic for a long period of time.  She was recently admitted to the hospital in January for decompensated heart failure.  She was treated medically and discharged  ?home.  However, she returned this month with recurrent severe heart failure requiring inotropic support for cardiogenic shock and ultimately an Impella 5.5 temporary LVAD as a bridge to implantation of a HeartMate 3.  The patient had been carefully  ?evaluated by the heart failure/mechanical cardiac assist team and found to be an appropriate candidate.  The procedure had been discussed in detail with the patient and her family including the expected benefits and the risks, which include bleeding,  ?stroke, infection, organ failure, recurrent heart failure or RV failure, and death.  She understood these issues and agreed to proceed with surgery under what I felt was an  informed consent. ? ?OPERATIVE FINDINGS:   ?1.  Severe dilatation of the heart, especially the left ventricle, which made proper fit of the heart pump a challenge in her space  constrained chest. ?2.  Perioperative coagulopathy requiring blood transfusion and blood product transfusion. ?3.  Adequate RV function for optimal performance of the HeartMate 3. ? ?DESCRIPTION OF PROCEDURE:  The patient was brought from the ICU where informed consent was documented and final issues were addressed in a face-to-face meeting with the patient and husband.  The patient was brought from the ICU to the OR with anesthesia  ?and perfusion and the Impella console.  The patient was placed supine on the operating table and general anesthesia was induced.  The lines needed for the surgery were then placed by the anesthesia team as well as a transesophageal echo, which confirmed  ?the preoperative diagnosis of severe LV dysfunction.  The patient was then prepped and draped as a sterile field including the Impella 5.5 catheter.  A proper timeout was performed.  A sternal incision was made and the sternotomy was performed.  The  ?sternal retractor was placed and the pericardium was opened.  The heart had severe enlargement.  The patient's body habitus being short and obese made exposure of the margins of the LV somewhat difficult.  Pursestrings were placed in the ascending aorta  ?and right atrium, and after heparin was administered and ACT was documented as being therapeutic, the patient was cannulated and placed on bypass.  As the patient was being placed on bypass, the Impella 5.5 catheter was removed through the graft into the ? subclavian artery without difficulty and the graft was doubly stapled and the incision was packed. ? ?  CO2 was insufflated into the operative field and the LV apex was positioned for inspection.  The apical dimple was identified and the coronary knife was used to remove the core of myocardium for the inflow  cannula.  2-0 Ethibond mattress sutures were  ?placed around the opening in the LV apex.  They were then placed through the inflow ring, which was then brought down, tied and secured.  While the patient was in steep Trendelenburg, volume was left into the heart and the HeartMate 3 pump was brought to ? the field.  The inflow cannula was inserted into the ventricle and the locking mechanism engaged normally.  The heart was then gently placed back into the pericardium after the pericardium had been opened and a small pocket in the left hemidiaphragm had ? been created to make room.  The power cord was then tunneled into the small incision in the mid abdomen out to the exit site under the left costal margin. ? ?Next, the outflow graft was then positioned around the venous cannula and divided to appropriate length and orientation.  A partial occlusion clamp was placed on the ascending aorta and the graft was sewn end-to-side with running 4-0 Prolene.  Air was  ?vented from the graft as the partial clamp was removed and this was hemostatic. ? ?Next, a ventricular pacing wire was placed and brought out through the chest.  Both pleural spaces were opened and drained and the lungs were expanded and ventilator was resumed.  The patient was placed on inotropic support and was transitioned from  ?cardiopulmonary bypass to HeartMate 3 support with a speed of 5100 RPMs.  This produced a flow of 4.2 liters per minute with adequate pulsatility.  Protamine was slowly administered without adverse reaction.  There was still considerable diffuse  ?bleeding.  The patient was then given platelets for low platelet count preoperatively as well as FFP and cryoprecipitate.  This improved the coagulation status.  Bilateral pleural tubes and anterior mediastinal and a pocket drain were placed and brought  ?out through separate incisions.  The patient remained stable.  The sternum was closed with interrupted steel wire and the pectoralis  fascia and subcutaneous layers were closed with running Vicryl.  The skin was closed with a subcuticular suture.  The  ?counter incision in the abdomen for the power cord was closed in 2 layers using Vicryl and the exit site of the power cord was closed with a subcutaneous Vicryl and 2 skin with nylon sutures. ? ?The incision to remove the Impella was irrigated and closed in layers using Vicryl for the subcutaneous layers and nylon sutures for the skin.  A 15-French drain had been placed in the tunnel where the graft was ligated and this was secured to the skin. ? ?Sterile dressings were then placed.  Chest x-ray was taken in the OR, which showed lines and devices to be in good position.  The patient was then transferred back to the ICU.  Total cardiopulmonary bypass time was 151 minutes. ? ? ?VAI ?D: 09/03/2021 7:20:24 pm T: 09/03/2021 10:01:00 pm  ?JOB: [id number]/ [id number]  ?

## 2021-09-03 NOTE — Transfer of Care (Signed)
Immediate Anesthesia Transfer of Care Note ? ?Patient: [name] ? ?Procedure(s) Performed: INSERTION OF IMPLANTABLE LEFT VENTRICULAR ASSIST DEVICE/ Heartmate 3 (Chest) ?TRANSESOPHAGEAL ECHOCARDIOGRAM (TEE) ?REMOVAL OF IMPELLA LEFT VENTRICULAR ASSIST DEVICE (Chest) ? ?Patient Location: ICU ? ?Anesthesia Type:General ? ?Level of Consciousness: sedated and Patient remains intubated per anesthesia plan ? ?Airway & Oxygen Therapy: Patient remains intubated per anesthesia plan and Patient placed on Ventilator (see vital sign flow sheet for setting) ? ?Post-op Assessment: Report given to RN and Post -op Vital signs reviewed and stable ? ?Post vital signs: Reviewed and stable ? ?Last Vitals:  ?Vitals Value Taken Time  ?BP 89/79   ?Temp    ?Pulse 116 09/03/21 1554  ?Resp 19 09/03/21 1558  ?SpO2 91 % 09/03/21 1554  ?Vitals shown include unvalidated device data. ? ?Last Pain:  ?Vitals:  ? 09/03/21 0400  ?TempSrc:   ?PainSc: 0-No pain  ?   ? ?Patients Stated Pain Goal: 6 (08/30/21 0500) ? ?Complications: No notable events documented. ?

## 2021-09-03 NOTE — Plan of Care (Signed)
?  Problem: Education: ?Goal: Ability to demonstrate management of disease process will improve ?Outcome: Progressing ?Goal: Ability to verbalize understanding of medication therapies will improve ?Outcome: Progressing ?  ?Problem: Activity: ?Goal: Capacity to carry out activities will improve ?Outcome: Progressing ?  ?Problem: Cardiac: ?Goal: Ability to achieve and maintain adequate cardiopulmonary perfusion will improve ?Outcome: Progressing ?  ?Problem: Education: ?Goal: Knowledge of General Education information will improve ?Description: Including pain rating scale, medication(s)/side effects and non-pharmacologic comfort measures ?Outcome: Progressing ?  ?Problem: Health Behavior/Discharge Planning: ?Goal: Ability to manage health-related needs will improve ?Outcome: Progressing ?  ?Problem: Clinical Measurements: ?Goal: Ability to maintain clinical measurements within normal limits will improve ?Outcome: Progressing ?Goal: Will remain free from infection ?Outcome: Progressing ?Goal: Diagnostic test results will improve ?Outcome: Progressing ?Goal: Respiratory complications will improve ?Outcome: Progressing ?Goal: Cardiovascular complication will be avoided ?Outcome: Progressing ?  ?Problem: Nutrition: ?Goal: Adequate nutrition will be maintained ?Outcome: Progressing ?  ?Problem: Activity: ?Goal: Risk for activity intolerance will decrease ?Outcome: Progressing ?  ?Problem: Coping: ?Goal: Level of anxiety will decrease ?Outcome: Progressing ?  ?

## 2021-09-03 NOTE — Progress Notes (Signed)
Pre Procedure note for inpatients: ?  ?[name] has been scheduled for Procedure(s): ?INSERTION OF IMPLANTABLE LEFT VENTRICULAR ASSIST DEVICE/ Heartmate 3 (N/A) ?TRANSESOPHAGEAL ECHOCARDIOGRAM (TEE) (N/A) today. The various methods of treatment have been discussed with the patient. After consideration of the risks, benefits and treatment options the patient has consented to the planned procedure.  ? ?The patient has been seen and labs reviewed. There are no changes in the patient?s condition to prevent proceeding with the planned procedure today. ? ?Recent labs: ? ?Lab Results  ?Component Value Date  ? WBC 26.0 (H) 09/03/2021  ? HGB 9.5 (L) 09/03/2021  ? HCT 32.3 (L) 09/03/2021  ? PLT 125 (L) 09/03/2021  ? GLUCOSE 94 09/03/2021  ? CHOL 100 08/26/2021  ? TRIG 95 08/26/2021  ? HDL 26 (L) 08/26/2021  ? LDLCALC 55 08/26/2021  ? ALT 65 (H) 09/01/2021  ? AST 76 (H) 09/01/2021  ? NA 132 (L) 09/03/2021  ? K 4.1 09/03/2021  ? CL 94 (L) 09/03/2021  ? CREATININE 0.78 09/03/2021  ? BUN 25 (H) 09/03/2021  ? CO2 29 09/03/2021  ? TSH <0.010 (L) 09/01/2021  ? INR 1.2 09/01/2021  ? HGBA1C 5.1 08/27/2021  ? ? ?Dahlia Byes, MD ?09/03/2021 8:02 AM ? ? ?   ?

## 2021-09-04 ENCOUNTER — Inpatient Hospital Stay (HOSPITAL_COMMUNITY): Payer: BC Managed Care – PPO

## 2021-09-04 DIAGNOSIS — Z95811 Presence of heart assist device: Secondary | ICD-10-CM | POA: Diagnosis not present

## 2021-09-04 DIAGNOSIS — I509 Heart failure, unspecified: Secondary | ICD-10-CM | POA: Diagnosis not present

## 2021-09-04 DIAGNOSIS — R570 Cardiogenic shock: Secondary | ICD-10-CM

## 2021-09-04 DIAGNOSIS — I428 Other cardiomyopathies: Secondary | ICD-10-CM

## 2021-09-04 LAB — GLUCOSE, CAPILLARY
Glucose-Capillary: 107 mg/dL — ABNORMAL HIGH (ref 70–99)
Glucose-Capillary: 108 mg/dL — ABNORMAL HIGH (ref 70–99)
Glucose-Capillary: 110 mg/dL — ABNORMAL HIGH (ref 70–99)
Glucose-Capillary: 110 mg/dL — ABNORMAL HIGH (ref 70–99)
Glucose-Capillary: 114 mg/dL — ABNORMAL HIGH (ref 70–99)
Glucose-Capillary: 120 mg/dL — ABNORMAL HIGH (ref 70–99)
Glucose-Capillary: 122 mg/dL — ABNORMAL HIGH (ref 70–99)
Glucose-Capillary: 123 mg/dL — ABNORMAL HIGH (ref 70–99)
Glucose-Capillary: 125 mg/dL — ABNORMAL HIGH (ref 70–99)
Glucose-Capillary: 129 mg/dL — ABNORMAL HIGH (ref 70–99)
Glucose-Capillary: 130 mg/dL — ABNORMAL HIGH (ref 70–99)
Glucose-Capillary: 137 mg/dL — ABNORMAL HIGH (ref 70–99)
Glucose-Capillary: 139 mg/dL — ABNORMAL HIGH (ref 70–99)
Glucose-Capillary: 141 mg/dL — ABNORMAL HIGH (ref 70–99)
Glucose-Capillary: 146 mg/dL — ABNORMAL HIGH (ref 70–99)
Glucose-Capillary: 153 mg/dL — ABNORMAL HIGH (ref 70–99)
Glucose-Capillary: 90 mg/dL (ref 70–99)

## 2021-09-04 LAB — POCT I-STAT 7, (LYTES, BLD GAS, ICA,H+H)
Acid-Base Excess: 3 mmol/L — ABNORMAL HIGH (ref 0.0–2.0)
Acid-Base Excess: 2 mmol/L (ref 0.0–2.0)
Acid-Base Excess: 5 mmol/L — ABNORMAL HIGH (ref 0.0–2.0)
Acid-Base Excess: 5 mmol/L — ABNORMAL HIGH (ref 0.0–2.0)
Bicarbonate: 27.6 mmol/L (ref 20.0–28.0)
Bicarbonate: 26.2 mmol/L (ref 20.0–28.0)
Bicarbonate: 28.6 mmol/L — ABNORMAL HIGH (ref 20.0–28.0)
Bicarbonate: 28.2 mmol/L — ABNORMAL HIGH (ref 20.0–28.0)
Calcium, Ion: 1.23 mmol/L (ref 1.15–1.40)
Calcium, Ion: 1.17 mmol/L (ref 1.15–1.40)
Calcium, Ion: 1.19 mmol/L (ref 1.15–1.40)
Calcium, Ion: 1.19 mmol/L (ref 1.15–1.40)
HCT: 19 % — ABNORMAL LOW (ref 36.0–46.0)
HCT: 22 % — ABNORMAL LOW (ref 36.0–46.0)
HCT: 24 % — ABNORMAL LOW (ref 36.0–46.0)
HCT: 24 % — ABNORMAL LOW (ref 36.0–46.0)
Hemoglobin: 6.5 g/dL — CL (ref 12.0–15.0)
Hemoglobin: 7.5 g/dL — ABNORMAL LOW (ref 12.0–15.0)
Hemoglobin: 8.2 g/dL — ABNORMAL LOW (ref 12.0–15.0)
Hemoglobin: 8.2 g/dL — ABNORMAL LOW (ref 12.0–15.0)
O2 Saturation: 100 %
O2 Saturation: 100 %
O2 Saturation: 99 %
O2 Saturation: 99 %
Patient temperature: 38.2
Patient temperature: 38.5
Patient temperature: 38.1
Patient temperature: 38
Potassium: 4.3 mmol/L (ref 3.5–5.1)
Potassium: 3.9 mmol/L (ref 3.5–5.1)
Potassium: 3.9 mmol/L (ref 3.5–5.1)
Potassium: 3.8 mmol/L (ref 3.5–5.1)
Sodium: 135 mmol/L (ref 135–145)
Sodium: 136 mmol/L (ref 135–145)
Sodium: 135 mmol/L (ref 135–145)
Sodium: 136 mmol/L (ref 135–145)
TCO2: 29 mmol/L (ref 22–32)
TCO2: 27 mmol/L (ref 22–32)
TCO2: 30 mmol/L (ref 22–32)
TCO2: 29 mmol/L (ref 22–32)
pCO2 arterial: 41.3 mmHg (ref 32–48)
pCO2 arterial: 38.3 mmHg (ref 32–48)
pCO2 arterial: 38.8 mmHg (ref 32–48)
pCO2 arterial: 38.5 mmHg (ref 32–48)
pH, Arterial: 7.437 (ref 7.35–7.45)
pH, Arterial: 7.449 (ref 7.35–7.45)
pH, Arterial: 7.479 — ABNORMAL HIGH (ref 7.35–7.45)
pH, Arterial: 7.476 — ABNORMAL HIGH (ref 7.35–7.45)
pO2, Arterial: 198 mmHg — ABNORMAL HIGH (ref 83–108)
pO2, Arterial: 182 mmHg — ABNORMAL HIGH (ref 83–108)
pO2, Arterial: 137 mmHg — ABNORMAL HIGH (ref 83–108)
pO2, Arterial: 135 mmHg — ABNORMAL HIGH (ref 83–108)

## 2021-09-04 LAB — PREPARE PLATELET PHERESIS
Unit division: 0
Unit division: 0

## 2021-09-04 LAB — BPAM CRYOPRECIPITATE
Blood Product Expiration Date: 202303141803
Blood Product Expiration Date: 202303141803
ISSUE DATE / TIME: 202303141237
ISSUE DATE / TIME: 202303141237
Unit Type and Rh: 5100
Unit Type and Rh: 5100

## 2021-09-04 LAB — CBC WITH DIFFERENTIAL/PLATELET
Abs Immature Granulocytes: 0 10*3/uL (ref 0.00–0.07)
Abs Immature Granulocytes: 0 10*3/uL (ref 0.00–0.07)
Abs Immature Granulocytes: 0.98 10*3/uL — ABNORMAL HIGH (ref 0.00–0.07)
Abs Immature Granulocytes: 1.05 10*3/uL — ABNORMAL HIGH (ref 0.00–0.07)
Basophils Absolute: 0 10*3/uL (ref 0.0–0.1)
Basophils Absolute: 0 10*3/uL (ref 0.0–0.1)
Basophils Absolute: 0 10*3/uL (ref 0.0–0.1)
Basophils Absolute: 0 10*3/uL (ref 0.0–0.1)
Basophils Relative: 0 %
Basophils Relative: 0 %
Basophils Relative: 0 %
Basophils Relative: 0 %
Eosinophils Absolute: 0.8 10*3/uL — ABNORMAL HIGH (ref 0.0–0.5)
Eosinophils Absolute: 0 10*3/uL (ref 0.0–0.5)
Eosinophils Absolute: 0 10*3/uL (ref 0.0–0.5)
Eosinophils Absolute: 0 10*3/uL (ref 0.0–0.5)
Eosinophils Relative: 3 %
Eosinophils Relative: 0 %
Eosinophils Relative: 0 %
Eosinophils Relative: 0 %
HCT: 20 % — ABNORMAL LOW (ref 36.0–46.0)
HCT: 23 % — ABNORMAL LOW (ref 36.0–46.0)
HCT: 25.3 % — ABNORMAL LOW (ref 36.0–46.0)
HCT: 23.3 % — ABNORMAL LOW (ref 36.0–46.0)
Hemoglobin: 6.7 g/dL — CL (ref 12.0–15.0)
Hemoglobin: 7.5 g/dL — ABNORMAL LOW (ref 12.0–15.0)
Hemoglobin: 8.3 g/dL — ABNORMAL LOW (ref 12.0–15.0)
Hemoglobin: 7.8 g/dL — ABNORMAL LOW (ref 12.0–15.0)
Immature Granulocytes: 5 %
Immature Granulocytes: 4 %
Lymphocytes Relative: 20 %
Lymphocytes Relative: 18 %
Lymphocytes Relative: 15 %
Lymphocytes Relative: 16 %
Lymphs Abs: 5 10*3/uL — ABNORMAL HIGH (ref 0.7–4.0)
Lymphs Abs: 3.7 10*3/uL (ref 0.7–4.0)
Lymphs Abs: 3.1 10*3/uL (ref 0.7–4.0)
Lymphs Abs: 4 10*3/uL (ref 0.7–4.0)
MCH: 27.7 pg (ref 26.0–34.0)
MCH: 27.8 pg (ref 26.0–34.0)
MCH: 27.9 pg (ref 26.0–34.0)
MCH: 29 pg (ref 26.0–34.0)
MCHC: 33.5 g/dL (ref 30.0–36.0)
MCHC: 32.6 g/dL (ref 30.0–36.0)
MCHC: 32.8 g/dL (ref 30.0–36.0)
MCHC: 33.5 g/dL (ref 30.0–36.0)
MCV: 82.6 fL (ref 80.0–100.0)
MCV: 85.2 fL (ref 80.0–100.0)
MCV: 85.2 fL (ref 80.0–100.0)
MCV: 86.6 fL (ref 80.0–100.0)
Monocytes Absolute: 1 10*3/uL (ref 0.1–1.0)
Monocytes Absolute: 1.5 10*3/uL — ABNORMAL HIGH (ref 0.1–1.0)
Monocytes Absolute: 2.9 10*3/uL — ABNORMAL HIGH (ref 0.1–1.0)
Monocytes Absolute: 2.7 10*3/uL — ABNORMAL HIGH (ref 0.1–1.0)
Monocytes Relative: 4 %
Monocytes Relative: 7 %
Monocytes Relative: 14 %
Monocytes Relative: 11 %
Neutro Abs: 18.3 10*3/uL — ABNORMAL HIGH (ref 1.7–7.7)
Neutro Abs: 15.6 10*3/uL — ABNORMAL HIGH (ref 1.7–7.7)
Neutro Abs: 13.9 10*3/uL — ABNORMAL HIGH (ref 1.7–7.7)
Neutro Abs: 17 10*3/uL — ABNORMAL HIGH (ref 1.7–7.7)
Neutrophils Relative %: 73 %
Neutrophils Relative %: 75 %
Neutrophils Relative %: 66 %
Neutrophils Relative %: 69 %
Platelets: 83 10*3/uL — ABNORMAL LOW (ref 150–400)
Platelets: 125 10*3/uL — ABNORMAL LOW (ref 150–400)
Platelets: 110 10*3/uL — ABNORMAL LOW (ref 150–400)
Platelets: 99 10*3/uL — ABNORMAL LOW (ref 150–400)
RBC: 2.42 MIL/uL — ABNORMAL LOW (ref 3.87–5.11)
RBC: 2.7 MIL/uL — ABNORMAL LOW (ref 3.87–5.11)
RBC: 2.97 MIL/uL — ABNORMAL LOW (ref 3.87–5.11)
RBC: 2.69 MIL/uL — ABNORMAL LOW (ref 3.87–5.11)
RDW: 20.5 % — ABNORMAL HIGH (ref 11.5–15.5)
RDW: 19.9 % — ABNORMAL HIGH (ref 11.5–15.5)
RDW: 19.1 % — ABNORMAL HIGH (ref 11.5–15.5)
RDW: 20.1 % — ABNORMAL HIGH (ref 11.5–15.5)
Smear Review: DECREASED
WBC Morphology: INCREASED
WBC: 25.1 10*3/uL — ABNORMAL HIGH (ref 4.0–10.5)
WBC: 20.8 10*3/uL — ABNORMAL HIGH (ref 4.0–10.5)
WBC: 20.9 10*3/uL — ABNORMAL HIGH (ref 4.0–10.5)
WBC: 24.8 10*3/uL — ABNORMAL HIGH (ref 4.0–10.5)
nRBC: 24.3 % — ABNORMAL HIGH (ref 0.0–0.2)
nRBC: 40 /100 WBC — ABNORMAL HIGH
nRBC: 21.2 % — ABNORMAL HIGH (ref 0.0–0.2)
nRBC: 35 /100 WBC — ABNORMAL HIGH
nRBC: 10 % — ABNORMAL HIGH (ref 0.0–0.2)
nRBC: 7.2 % — ABNORMAL HIGH (ref 0.0–0.2)

## 2021-09-04 LAB — COOXEMETRY PANEL
Carboxyhemoglobin: 1.2 % (ref 0.5–1.5)
Carboxyhemoglobin: <0.3 % — ABNORMAL LOW (ref 0.5–1.5)
Methemoglobin: 1.2 % (ref 0.0–1.5)
Methemoglobin: 2.9 % — ABNORMAL HIGH (ref 0.0–1.5)
O2 Saturation: 61.4 %
O2 Saturation: 61.8 %
Total hemoglobin: 6.9 g/dL — CL (ref 12.0–16.0)
Total hemoglobin: 8 g/dL — ABNORMAL LOW (ref 12.0–16.0)

## 2021-09-04 LAB — BPAM FFP
Blood Product Expiration Date: 202303192359
Blood Product Expiration Date: 202303192359
Blood Product Expiration Date: 202303192359
Blood Product Expiration Date: 202303192359
Blood Product Expiration Date: 202303192359
Blood Product Expiration Date: 202303192359
ISSUE DATE / TIME: 202303140942
ISSUE DATE / TIME: 202303140942
ISSUE DATE / TIME: 202303140942
ISSUE DATE / TIME: 202303140942
ISSUE DATE / TIME: 202303141719
ISSUE DATE / TIME: 202303141719
Unit Type and Rh: 7300
Unit Type and Rh: 7300
Unit Type and Rh: 7300
Unit Type and Rh: 7300
Unit Type and Rh: 7300
Unit Type and Rh: 7300

## 2021-09-04 LAB — PREPARE FRESH FROZEN PLASMA: Unit division: 0

## 2021-09-04 LAB — BPAM PLATELET PHERESIS
Blood Product Expiration Date: 202303142359
Blood Product Expiration Date: 202303142359
ISSUE DATE / TIME: 202303141105
ISSUE DATE / TIME: 202303141209
Unit Type and Rh: 5100
Unit Type and Rh: 600

## 2021-09-04 LAB — PREPARE CRYOPRECIPITATE
Unit division: 0
Unit division: 0

## 2021-09-04 LAB — COMPREHENSIVE METABOLIC PANEL
ALT: 31 U/L (ref 0–44)
AST: 92 U/L — ABNORMAL HIGH (ref 15–41)
Albumin: 3 g/dL — ABNORMAL LOW (ref 3.5–5.0)
Alkaline Phosphatase: 37 U/L — ABNORMAL LOW (ref 38–126)
Anion gap: 10 (ref 5–15)
BUN: 25 mg/dL — ABNORMAL HIGH (ref 6–20)
CO2: 26 mmol/L (ref 22–32)
Calcium: 8.5 mg/dL — ABNORMAL LOW (ref 8.9–10.3)
Chloride: 100 mmol/L (ref 98–111)
Creatinine, Ser: 1.13 mg/dL — ABNORMAL HIGH (ref 0.44–1.00)
GFR, Estimated: 59 mL/min — ABNORMAL LOW (ref >60)
Glucose, Bld: 119 mg/dL — ABNORMAL HIGH (ref 70–99)
Potassium: 4.3 mmol/L (ref 3.5–5.1)
Sodium: 136 mmol/L (ref 135–145)
Total Bilirubin: 1.5 mg/dL — ABNORMAL HIGH (ref 0.3–1.2)
Total Protein: 4.4 g/dL — ABNORMAL LOW (ref 6.5–8.1)

## 2021-09-04 LAB — BASIC METABOLIC PANEL
Anion gap: 13 (ref 5–15)
BUN: 27 mg/dL — ABNORMAL HIGH (ref 6–20)
CO2: 24 mmol/L (ref 22–32)
Calcium: 8.4 mg/dL — ABNORMAL LOW (ref 8.9–10.3)
Chloride: 98 mmol/L (ref 98–111)
Creatinine, Ser: 1.19 mg/dL — ABNORMAL HIGH (ref 0.44–1.00)
GFR, Estimated: 56 mL/min — ABNORMAL LOW (ref >60)
Glucose, Bld: 120 mg/dL — ABNORMAL HIGH (ref 70–99)
Potassium: 3.8 mmol/L (ref 3.5–5.1)
Sodium: 135 mmol/L (ref 135–145)

## 2021-09-04 LAB — PROTIME-INR
INR: 0.8 (ref 0.8–1.2)
Prothrombin Time: 11 seconds — ABNORMAL LOW (ref 11.4–15.2)

## 2021-09-04 LAB — BRAIN NATRIURETIC PEPTIDE: B Natriuretic Peptide: 358.8 pg/mL — ABNORMAL HIGH (ref 0.0–100.0)

## 2021-09-04 LAB — EXPECTORATED SPUTUM ASSESSMENT W GRAM STAIN, RFLX TO RESP C

## 2021-09-04 LAB — PREPARE RBC (CROSSMATCH)

## 2021-09-04 LAB — PHOSPHORUS: Phosphorus: 6.1 mg/dL — ABNORMAL HIGH (ref 2.5–4.6)

## 2021-09-04 LAB — MAGNESIUM
Magnesium: 2.5 mg/dL — ABNORMAL HIGH (ref 1.7–2.4)
Magnesium: 2.3 mg/dL (ref 1.7–2.4)

## 2021-09-04 LAB — SURGICAL PATHOLOGY

## 2021-09-04 LAB — LACTATE DEHYDROGENASE: LDH: 391 U/L — ABNORMAL HIGH (ref 98–192)

## 2021-09-04 MED ORDER — SODIUM CHLORIDE 0.9% IV SOLUTION: Freq: Once | INTRAVENOUS | Status: DC

## 2021-09-04 MED ORDER — DOCUSATE SODIUM 50 MG/5ML PO LIQD
100.0000 mg | Freq: Every day | ORAL | Status: DC
  Administered 2021-09-04: 100 mg
  Filled 2021-09-04: qty 10

## 2021-09-04 MED ORDER — ROSUVASTATIN CALCIUM 20 MG PO TABS
40.0000 mg | ORAL_TABLET | Freq: Every day | ORAL | Status: DC
  Administered 2021-09-04: 40 mg
  Filled 2021-09-04: qty 2

## 2021-09-04 MED ORDER — PANTOPRAZOLE 2 MG/ML SUSPENSION
40.0000 mg | Freq: Every day | ORAL | Status: DC
  Administered 2021-09-04: 40 mg
  Filled 2021-09-04: qty 20

## 2021-09-04 MED ORDER — METHIMAZOLE 5 MG PO TABS
5.0000 mg | ORAL_TABLET | Freq: Every evening | ORAL | Status: DC
  Administered 2021-09-04: 5 mg
  Filled 2021-09-04: qty 1

## 2021-09-04 MED ORDER — TRAMADOL HCL 50 MG PO TABS: 50.0000 mg | ORAL_TABLET | ORAL | Status: DC | PRN

## 2021-09-04 MED ORDER — MILRINONE LACTATE IN DEXTROSE 20-5 MG/100ML-% IV SOLN
0.2500 ug/kg/min | INTRAVENOUS | Status: DC
  Administered 2021-09-04 (×2): 0.375 ug/kg/min via INTRAVENOUS
  Administered 2021-09-05 (×2): 0.25 ug/kg/min via INTRAVENOUS
  Administered 2021-09-05: 0.375 ug/kg/min via INTRAVENOUS
  Administered 2021-09-06 – 2021-09-09 (×7): 0.25 ug/kg/min via INTRAVENOUS
  Filled 2021-09-04 (×12): qty 100

## 2021-09-04 MED ORDER — OXYCODONE HCL 5 MG PO TABS
5.0000 mg | ORAL_TABLET | ORAL | Status: DC | PRN
  Administered 2021-09-04 (×2): 10 mg
  Administered 2021-09-05: 5 mg
  Administered 2021-09-05 (×2): 10 mg
  Filled 2021-09-04 (×5): qty 2

## 2021-09-04 MED ORDER — METHIMAZOLE 10 MG PO TABS
10.0000 mg | ORAL_TABLET | Freq: Every day | ORAL | Status: DC
  Administered 2021-09-04: 10 mg
  Filled 2021-09-04 (×2): qty 1

## 2021-09-04 MED ORDER — SODIUM CHLORIDE 0.9 % IV SOLN
2.0000 g | Freq: Three times a day (TID) | INTRAVENOUS | Status: AC
  Administered 2021-09-04 – 2021-09-11 (×21): 2 g via INTRAVENOUS
  Filled 2021-09-04 (×21): qty 2

## 2021-09-04 MED ORDER — FUROSEMIDE 10 MG/ML IJ SOLN
40.0000 mg | Freq: Two times a day (BID) | INTRAMUSCULAR | Status: DC
  Administered 2021-09-05 (×2): 40 mg via INTRAVENOUS
  Filled 2021-09-04 (×2): qty 4

## 2021-09-04 MED ORDER — ACETAMINOPHEN 10 MG/ML IV SOLN
1000.0000 mg | Freq: Four times a day (QID) | INTRAVENOUS | Status: DC
  Administered 2021-09-04: 1000 mg via INTRAVENOUS
  Filled 2021-09-04: qty 100

## 2021-09-04 MED ORDER — SODIUM CHLORIDE 0.9% IV SOLUTION: Freq: Once | INTRAVENOUS | Status: AC

## 2021-09-04 MED ORDER — COAGULATION FACTOR VIIA RECOMB 2 MG IV SOLR
2000.0000 ug | Freq: Once | INTRAVENOUS | Status: AC
Start: 2021-09-04 — End: 2021-09-04
  Administered 2021-09-04: 2000 ug via INTRAVENOUS
  Filled 2021-09-04: qty 2000

## 2021-09-04 MED ORDER — FUROSEMIDE 10 MG/ML IJ SOLN
40.0000 mg | Freq: Once | INTRAMUSCULAR | Status: AC
  Administered 2021-09-04: 40 mg via INTRAVENOUS
  Filled 2021-09-04: qty 4

## 2021-09-04 MED FILL — Heparin Sodium (Porcine) Inj 1000 Unit/ML: Qty: 1000 | Status: AC

## 2021-09-04 MED FILL — Sodium Bicarbonate IV Soln 8.4%: INTRAVENOUS | Qty: 50 | Status: AC

## 2021-09-04 MED FILL — Electrolyte-R (PH 7.4) Solution: INTRAVENOUS | Qty: 6000 | Status: AC

## 2021-09-04 MED FILL — Albumin, Human Inj 5%: INTRAVENOUS | Qty: 500 | Status: AC

## 2021-09-04 MED FILL — Potassium Chloride Inj 2 mEq/ML: INTRAVENOUS | Qty: 20 | Status: AC

## 2021-09-04 MED FILL — Mannitol IV Soln 20%: INTRAVENOUS | Qty: 500 | Status: AC

## 2021-09-04 MED FILL — Heparin Sodium (Porcine) Inj 1000 Unit/ML: INTRAMUSCULAR | Qty: 20 | Status: AC

## 2021-09-04 MED FILL — Magnesium Sulfate Inj 50%: INTRAMUSCULAR | Qty: 10 | Status: AC

## 2021-09-04 MED FILL — Sodium Chloride IV Soln 0.9%: INTRAVENOUS | Qty: 3000 | Status: AC

## 2021-09-04 MED FILL — Calcium Chloride Inj 10%: INTRAVENOUS | Qty: 10 | Status: AC

## 2021-09-04 NOTE — Progress Notes (Signed)
CSW met at bedside with patient and husband. Patient was extubated and was very talkative. She was relieved to be extubated and appeared in good spirits. Patient's husband noted how stressful the day was with her attempting to communicate with him and due to breathing tube he was unable to understand her. He said watching her getting frustrated with inability to communicate was hard and glad to have that part of the recovery behind them. CSW provided supportive intervention and will continue to follow throughout implant hospitalization. Raquel Sarna, [place], [place] ? ?

## 2021-09-04 NOTE — Anesthesia Postprocedure Evaluation (Signed)
Anesthesia Post Note ? ?Patient: [name] ? ?Procedure(s) Performed: INSERTION OF IMPLANTABLE LEFT VENTRICULAR ASSIST DEVICE/ Heartmate 3 (Chest) ?TRANSESOPHAGEAL ECHOCARDIOGRAM (TEE) ?REMOVAL OF IMPELLA LEFT VENTRICULAR ASSIST DEVICE (Chest) ? ?  ? ?Patient location during evaluation: SICU ?Anesthesia Type: General ?Level of consciousness: sedated, patient cooperative and patient remains intubated per anesthesia plan ?Pain management: pain level controlled ?Vital Signs Assessment: post-procedure vital signs reviewed and stable ?Respiratory status: patient remains intubated per anesthesia plan ?Cardiovascular status: stable ?Anesthetic complications: no ? ? ?No notable events documented. ? ?Last Vitals:  ?Vitals:  ? 09/04/21 0900 09/04/21 0915  ?BP:    ?Pulse: (!) 101 (!) 105  ?Resp: 17 17  ?Temp: (!) 38.4 ?C (!) 38.4 ?C  ?SpO2: 96% 98%  ?  ?Last Pain:  ?Vitals:  ? 09/04/21 0600  ?TempSrc: Core  ?PainSc:   ? ? ?  ?  ?  ?  ?  ?  ? ?Nolon Nations ? ? ? ? ?

## 2021-09-04 NOTE — Progress Notes (Signed)
EVENING ROUNDS NOTE : ? ?   ?[address] ?      [place] [address] ?            [phone]   ?              ?1 Day Post-Op ?Procedure(s) (LRB): ?INSERTION OF IMPLANTABLE LEFT VENTRICULAR ASSIST DEVICE/ Heartmate 3 (N/A) ?TRANSESOPHAGEAL ECHOCARDIOGRAM (TEE) (N/A) ?REMOVAL OF IMPELLA LEFT VENTRICULAR ASSIST DEVICE (N/A) ? ? ?Total Length of Stay:  LOS: 9 days  ?Events:   ?Extubated this afternoon ?Resting comfortably ? ? ? ? ?BP (!) 80/35   Pulse (!) 110   Temp (!) 100.4 ?F (38 ?C)   Resp (!) 23   Ht '5\' 3"'$  (1.6 m)   Wt 110.3 kg   SpO2 100%   BMI 43.08 kg/m?  ? ?PAP: (27-51)/(11-27) 33/14 ?CVP:  [4 mmHg-21 mmHg] 5 mmHg ?CO:  [3.6 L/min-7.6 L/min] 7.6 L/min ?CI:  [1.8 L/min/m2-3.8 L/min/m2] 3.8 L/min/m2 ? ?Vent Mode: CPAP;PSV ?FiO2 (%):  [40 %-50 %] 40 % ?Set Rate:  [4 bmp-20 bmp] 4 bmp ?Vt Set:  [540 mL] 540 mL ?PEEP:  [5 cmH20] 5 cmH20 ?Pressure Support:  [10 cmH20] 10 cmH20 ?Plateau Pressure:  [25 cmH20-26 cmH20] 25 cmH20 ? ? sodium chloride 20 mL/hr at 09/04/21 1700  ? sodium chloride    ? sodium chloride    ? amiodarone 30 mg/hr (09/04/21 1700)  ? ceFEPime (MAXIPIME) IV Stopped (09/04/21 1325)  ? dexmedetomidine (PRECEDEX) IV infusion Stopped (09/04/21 1603)  ? epinephrine 10 mcg/min (09/04/21 1700)  ? insulin 2.8 Units/hr (09/04/21 1700)  ? lactated ringers    ? lactated ringers 20 mL/hr at 09/04/21 1700  ? milrinone 0.375 mcg/kg/min (09/04/21 1700)  ? norepinephrine (LEVOPHED) Adult infusion 6 mcg/min (09/04/21 1700)  ? vancomycin Stopped (09/04/21 1150)  ? vasopressin Stopped (09/04/21 1322)  ? ? ?I/O last 3 completed shifts: ?In: 11436.4 [I.V.:4102.7; Blood:5340.3; Other:132.6; IV Piggyback:1860.8] ?Out: 8710 [IOXBD:5329; Drains:25; Blood:2600; Chest Tube:2250] ? ? ?CBC Latest Ref Rng & Units 09/04/2021 09/04/2021 09/04/2021  ?WBC 4.0 - 10.5 K/uL - - -  ?Hemoglobin 12.0 - 15.0 g/dL 8.2(L) 8.2(L) 7.5(L)  ?Hematocrit 36.0 - 46.0 % 24.0(L) 24.0(L) 22.0(L)  ?Platelets 150 - 400 K/uL - - -   ? ? ?BMP Latest Ref Rng & Units 09/04/2021 09/04/2021 09/04/2021  ?Glucose 70 - 99 mg/dL - - 120(H)  ?BUN 6 - 20 mg/dL - - 27(H)  ?Creatinine 0.44 - 1.00 mg/dL - - 1.19(H)  ?BUN/Creat Ratio 9 - 23 - - -  ?Sodium 135 - 145 mmol/L 136 135 135  ?Potassium 3.5 - 5.1 mmol/L 3.8 3.9 3.8  ?Chloride 98 - 111 mmol/L - - 98  ?CO2 22 - 32 mmol/L - - 24  ?Calcium 8.9 - 10.3 mg/dL - - 8.4(L)  ? ? ?ABG ?   ?Component Value Date/Time  ? PHART 7.476 (H) 09/04/2021 1722  ? PCO2ART 38.5 09/04/2021 1722  ? PO2ART 135 (H) 09/04/2021 1722  ? HCO3 28.2 (H) 09/04/2021 1722  ? TCO2 29 09/04/2021 1722  ? ACIDBASEDEF 1.0 09/03/2021 1818  ? O2SAT 99 09/04/2021 1722  ? ? ? ? ? ?Melodie Bouillon, MD ?09/04/2021 5:53 PM ? ? ?

## 2021-09-04 NOTE — Progress Notes (Signed)
Sats reading low 80s on monitor with unreliable pleth. ABG obtained with SpO2 at 100%. ?

## 2021-09-04 NOTE — Progress Notes (Signed)
Paged [name] MD in regards to patient's temperature rising up to 38.8 despite ice packs and oral tylenol. Orders received for pancultures and to add Cefepime. Also notified Prescott Gum MD who agreed with plan and also ordered IV tylenol 1 g.  ?

## 2021-09-04 NOTE — Progress Notes (Signed)
HeartMate 2 Rounding Note ? ?Subjective:   ? ?- 3/14: HM3 LVAD placement with removal of Impella 5.5  ?- 3/15: Speed increased to 5300 rpm ? ?Today, she is on epinephrine 10, milrinone 0.375, NE 5, vasopressin 0.03.  NO 4.  ? ?Anemic this morning with hgb 6.7, has received PRBCs.  Weight up post-op ? ?She remains on amiodarone gtt, no VT.  ? ?She remains on methimazole and IV Solumedrol for hyperthyroidism.  ? ?[name] numbers: ?CVP 9 ?PA 31/16 ?CI 2.2 ?Co-ox 61% ? ?LVAD INTERROGATION:  ?HeartMate II LVAD:  Flow 3.8 liters/min, speed 5300, power 3.6, PI 3.7.  Few PI events overnight.  ?LDH 391 ? ?Objective:   ? ?Vital Signs:   ?Temp:  [96.6 ?F (35.9 ?C)-101.3 ?F (38.5 ?C)] 101.3 ?F (38.5 ?C) (03/15 0800) ?Pulse Rate:  [82-194] 104 (03/15 0800) ?Resp:  [6-26] 19 (03/15 0800) ?BP: (54-91)/(24-77) 88/75 (03/15 0750) ?SpO2:  [85 %-100 %] 100 % (03/15 0800) ?Arterial Line BP: (54-112)/(44-95) 87/74 (03/15 0800) ?FiO2 (%):  [50 %-100 %] 50 % (03/15 0800) ?Weight:  [110.3 kg] 110.3 kg (03/15 0505) ?Last BM Date : 09/01/21 ?Mean arterial Pressure 78 ? ?Intake/Output:  ? ?Intake/Output Summary (Last 24 hours) at 09/04/2021 0824 ?Last data filed at 09/04/2021 0800 ?Gross per 24 hour  ?Intake 10965.14 ml  ?Output 7390 ml  ?Net 3575.14 ml  ?  ? ?Physical Exam: ?General:  Intubated.  ?HEENT: normal ?Neck: supple. JVP 10. Carotids 2+ bilat; no bruits. No lymphadenopathy or thryomegaly appreciated. ?Cor: Mechanical heart sounds with LVAD hum present. ?Lungs: clear ?Abdomen: soft, nontender, nondistended. No hepatosplenomegaly. No bruits or masses. Good bowel sounds. ?Driveline: C/D/I; securement device intact and driveline incorporated ?Extremities: no cyanosis, clubbing, rash, edema ?Neuro: Awake on vent  ? ?Telemetry: NSR 100s (personally reviewed) ? ?Labs: ?Basic Metabolic Panel: ?Recent Labs  ?Lab 08/31/21 ?1641 09/01/21 ?0331 09/01/21 ?0341 09/01/21 ?1637 09/02/21 ?8299 09/02/21 ?3716 09/03/21 ?9678 09/03/21 ?9381 09/03/21 ?1312  09/03/21 ?1355 09/03/21 ?1358 09/03/21 ?1418 09/03/21 ?1611 09/03/21 ?1617 09/03/21 ?1818 09/03/21 ?2022 09/03/21 ?2059 09/04/21 ?0175 09/04/21 ?0403  ?NA 133* 131*   < >  --  135   < > 132*   < > 135   < > 135   < > 137   < > 136 136 135 136 135  ?K 4.7 3.7   < >  --  3.7   < > 4.1   < > 4.5   < > 3.9   < > 3.7   < > 3.6 3.9 4.1 4.3 4.3  ?CL 94* 93*  --   --  94*  --  94*   < > 93*  --  96*  --  101  --   --   --  100 100  --   ?CO2 29 30  --   --  33*  --  29  --   --   --   --   --  28  --   --   --  24 26  --   ?GLUCOSE 120* 107*  --   --  101*  --  94   < > 196*  --  183*  --  124*  --   --   --  160* 119*  --   ?BUN 28* 26*  --   --  25*  --  25*   < > 26*  --  26*  --  21*  --   --   --  22* 25*  --   ?CREATININE 1.08* 1.00  --   --  0.91  --  0.78   < > 0.80  --  0.80  --  1.02*  --   --   --  1.04* 1.13*  --   ?CALCIUM 9.4 8.7*  --   --  9.1  --  9.2  --   --   --   --   --  8.2*  --   --   --  8.9 8.5*  --   ?MG 2.3 2.3  --  2.3 2.3  --  1.9  --   --   --   --   --  2.0  --   --   --  2.9* 2.5*  --   ?PHOS 3.6 3.9  --  4.3 4.6  --   --   --   --   --   --   --   --   --   --   --   --  6.1*  --   ? < > = values in this interval not displayed.  ? ? ?Liver Function Tests: ?Recent Labs  ?Lab 09/01/21 ?0331 09/04/21 ?0311  ?AST 76* 92*  ?ALT 65* 31  ?ALKPHOS 104 37*  ?BILITOT 1.0 1.5*  ?PROT 5.7* 4.4*  ?ALBUMIN 2.7* 3.0*  ? ?No results for input(s): LIPASE, AMYLASE in the last 168 hours. ?No results for input(s): AMMONIA in the last 168 hours. ? ?CBC: ?Recent Labs  ?Lab 09/02/21 ?0311 09/02/21 ?0619 09/03/21 ?8299 09/03/21 ?3716 09/03/21 ?1151 09/03/21 ?1210 09/03/21 ?1611 09/03/21 ?1617 09/03/21 ?1818 09/03/21 ?2022 09/03/21 ?2059 09/04/21 ?9678 09/04/21 ?0403  ?WBC 24.5*  --  26.0*  --   --   --  36.4*  --   --   --  27.4* 25.1*  --   ?NEUTROABS  --   --   --   --   --   --   --   --   --   --   --  18.3*  --   ?HGB 9.7*   < > 9.5*   < > 6.8*   < > 10.6*   < > 9.5* 8.5* 8.6* 6.7* 6.5*  ?HCT 32.8*   < > 32.3*    < > 22.0*   < > 32.9*   < > 28.0* 25.0* 25.9* 20.0* 19.0*  ?MCV 79.0*  --  79.0*  --   --   --  83.7  --   --   --  82.2 82.6  --   ?PLT 150  --  125*  --  100*  --  120*  120*  --   --   --  93* 83*  --   ? < > = values in this interval not displayed.  ? ? ?INR: ?Recent Labs  ?Lab 09/01/21 ?0331 09/03/21 ?1611 09/04/21 ?0311  ?INR 1.2 1.5* 0.8  ? ? ?Other results: ?EKG:  ? ?Imaging: ?DG Chest Portable 1 View ? ?Result Date: 09/03/2021 ?CLINICAL DATA:  Evaluate for pneumothorax. LVAD. Removal of Impella. EXAM: PORTABLE CHEST 1 VIEW COMPARISON:  Chest x-ray 09/02/2021 FINDINGS: Endotracheal tube is 4 cm above the carina. Enteric tube extends below the diaphragm. Swan-Ganz catheter tip projects over the right main pulmonary artery, unchanged. Impella has been removed. There is a new left ventricular assist device in place. Left-sided pacemaker is unchanged in position. New bilateral chest tubes are in place. There is no pneumothorax  visualized. The lungs are clear. No pleural effusion. No acute fractures. IMPRESSION: New postsurgical changes. Stable cardiomegaly. The lungs are clear.  No pneumothorax. 1. Electronically Signed   By: Ronney Asters M.D.   On: 09/03/2021 15:43  ? ?Port CXR ? ?Result Date: 09/02/2021 ?CLINICAL DATA:  Central line placement EXAM: PORTABLE CHEST 1 VIEW COMPARISON:  09/01/2021 at 6:18 a.m. FINDINGS: Single frontal view of the chest demonstrates enteric catheter passing below diaphragm tip excluded by collimation. A flow directed right internal jugular central catheter tip overlies the proximal right lower lobe pulmonary artery. Right-sided PICC tip projects over the superior vena cava. Left-sided AICD unchanged. LVAD via right upper extremity approach tip projects over the left ventricle, minimally retracted since prior study. Continued enlargement of the cardiac silhouette. No airspace disease, effusion, or pneumothorax. No acute bony abnormalities. IMPRESSION: 1. Support devices as above.  2. No acute airspace disease. 3. Stable cardiomegaly. Electronically Signed   By: Randa Ngo M.D.   On: 09/02/2021 15:44  ? ?ECHOCARDIOGRAM LIMITED ? ?Result Date: 09/02/2021 ?   ECHOCARDIOGRAM LIMITED REPORT   Patient Name:   GURTHA PICKER Date of Exam: 09/02/2021 Medical Rec #:  [id number]    Height:       63.0 in Accession #:    [id number]   Weight:       223.1 lb Date of Birth:  [birth year]    BSA:          2.026 m? Patient Age:    51 years     BP:           87/45 mmHg Patient Gender: F            HR:           98 bpm. Exam Location:  Inpatient Procedure: Limited Echo Indications:    Impella Placement; CHF-Acute Systolic Y19.50  History:        Patient has prior history of Echocardiogram examinations, most                 recent 08/29/2021. CHF and Cardiomyopathy, Previous Myocardial                 Infarction, Defibrillator; Risk Factors:Hypertension.  Sonographer:    Maudry Mayhew RDMS, RVT, RDCS Referring Phys: [place]  1. Left ventricular ejection fraction, by estimation, is <20%. The left ventricle has severely decreased function. The left ventricle demonstrates global hypokinesis. Impella Catheter present in LV. Echo done for placement with final tip position 4.57cm  from aortic valve. FINDINGS  Left Ventricle: Impella Catheter present in LV. Echo done for placement with final tip position 4.57cm from aortic valve. Left ventricular ejection fraction, by estimation, is <20%. The left ventricle has severely decreased function. The left ventricle demonstrates global hypokinesis. Fransico Him MD Electronically signed by Fransico Him MD Signature Date/Time: 09/02/2021/10:10:39 AM    Final    ? ? ?Medications:   ? ? ?Scheduled Medications: ? (feeding supplement) PROSource Plus  60 mL Oral TID BM  ? sodium chloride   Intravenous Once  ? sodium chloride   Intravenous Once  ? acetaminophen  1,000 mg Oral Q6H  ? Or  ? acetaminophen (TYLENOL) oral liquid 160 mg/5 mL  1,000 mg Per Tube Q6H  ?  aspirin EC  325 mg Oral Daily  ? Or  ? aspirin  324 mg Per Tube Daily  ? Or  ? aspirin  300 mg Rectal Daily  ? bisacodyl  10  mg Rectal Daily  ? chlorhexidine gluconate (MEDLINE KIT)  15 mL Mouth Rinse

## 2021-09-04 NOTE — Progress Notes (Signed)
1 Day Post-Op Procedure(s) (LRB): ?INSERTION OF IMPLANTABLE LEFT VENTRICULAR ASSIST DEVICE/ Heartmate 3 (N/A) ?TRANSESOPHAGEAL ECHOCARDIOGRAM (TEE) (N/A) ?REMOVAL OF IMPELLA LEFT VENTRICULAR ASSIST DEVICE (N/A) ?Subjective: ?Postop coagulopathy treated with transfusion and clotting factors overnight- now imroved and will aim toward vent wean , extubation today ?Responds on vent\VAD parameters adequate ?VAD speed 5200 rpm ? ?Objective: ?Vital signs in last 24 hours: ?Temp:  [96.6 ?F (35.9 ?C)-101.3 ?F (38.5 ?C)] 101.1 ?F (38.4 ?C) (03/15 0915) ?Pulse Rate:  [82-194] 105 (03/15 0915) ?Cardiac Rhythm: Sinus tachycardia (03/15 0800) ?Resp:  [6-26] 17 (03/15 0915) ?BP: (54-91)/(24-77) 88/75 (03/15 0750) ?SpO2:  [85 %-100 %] 98 % (03/15 0915) ?Arterial Line BP: (54-112)/(44-95) 85/72 (03/15 0915) ?FiO2 (%):  [50 %-100 %] 50 % (03/15 0800) ?Weight:  [110.3 kg] 110.3 kg (03/15 0505) ? ?Hemodynamic parameters for last 24 hours: ?PAP: (29-55)/(14-35) 30/14 ?CVP:  [6 mmHg-21 mmHg] 8 mmHg ?CO:  [3.6 L/min-5.8 L/min] 4.8 L/min ?CI:  [1.8 L/min/m2-2.9 L/min/m2] 2.4 L/min/m2 ? ?Intake/Output from previous day: ?03/14 0701 - 03/15 0700 ?In: 10860.6 [I.V.:3659.5; Blood:5340.3; IV Piggyback:1860.8] ?Out: 9150 [Urine:2330; Drains:25; Blood:2600; Chest Tube:2250] ?Intake/Output this shift: ?Total I/O ?In: 444.8 [I.V.:189; NG/GT:130; IV Piggyback:125.8] ?Out: 315 [Urine:175; Chest Tube:140] ? ?  ?   Exam ? ?  General- awake and comfortable on vent ?   Neck- no JVD, no cervical adenopathy palpable, no carotid bruit ?  Lungs- clear without rales, wheezes ?  Cor- regular rate and rhythm, normal VAD hum, no murmur , gallop ?  Abdomen- soft, non-tender ?  Extremities - warm, non-tender, minimal edema ?  Neuro-  appropriate, no focal weakness ? ? ?Lab Results: ?Recent Labs  ?  09/03/21 ?2059 09/04/21 ?5697 09/04/21 ?0403  ?WBC 27.4* 25.1*  --   ?HGB 8.6* 6.7* 6.5*  ?HCT 25.9* 20.0* 19.0*  ?PLT 93* 83*  --   ? ?BMET:  ?Recent Labs  ?   09/03/21 ?2059 09/04/21 ?9480 09/04/21 ?0403  ?NA 135 136 135  ?K 4.1 4.3 4.3  ?CL 100 100  --   ?CO2 24 26  --   ?GLUCOSE 160* 119*  --   ?BUN 22* 25*  --   ?CREATININE 1.04* 1.13*  --   ?CALCIUM 8.9 8.5*  --   ?  ?PT/INR:  ?Recent Labs  ?  09/04/21 ?0311  ?LABPROT 11.0*  ?INR 0.8  ? ?ABG ?   ?Component Value Date/Time  ? PHART 7.437 09/04/2021 0403  ? HCO3 27.6 09/04/2021 0403  ? TCO2 29 09/04/2021 0403  ? ACIDBASEDEF 1.0 09/03/2021 1818  ? O2SAT 61.8 09/04/2021 0753  ? ?CBG (last 3)  ?Recent Labs  ?  09/04/21 ?0610 09/04/21 ?0700 09/04/21 ?1655  ?GLUCAP 137* 129* 110*  ? ? ?Assessment/Plan: ?S/P Procedure(s) (LRB): ?INSERTION OF IMPLANTABLE LEFT VENTRICULAR ASSIST DEVICE/ Heartmate 3 (N/A) ?TRANSESOPHAGEAL ECHOCARDIOGRAM (TEE) (N/A) ?REMOVAL OF IMPELLA LEFT VENTRICULAR ASSIST DEVICE (N/A) ?Wean vent if chest tubes< 100/hr 3 consecutive hours ?Serial Hb to keep > 8.5 grams ?Transition to NO thru nasal cannula2-3ppm ? LOS: 9 days  ? ? ?[name] ?09/04/2021 ?  ?

## 2021-09-04 NOTE — Progress Notes (Signed)
Nutrition Follow-up ? ?DOCUMENTATION CODES:  ? ?Obesity unspecified, Non-severe (moderate) malnutrition in context of chronic illness ? ?INTERVENTION:  ? ?Recommend considering replacing Cortrak tube ? ?Resume ProSource Plus to 60 mL TID, each supplement provides 100 kcals and 15 grams protein. Total of 600 kcals, 90 g of protein. Pt to take with Lemonade per pt request ? ?Continue with MVI with Minerals daily ? ? ?NUTRITION DIAGNOSIS:  ? ?Moderate Malnutrition related to chronic illness as evidenced by moderate muscle depletion, mild fat depletion, energy intake < 75% for > or equal to 1 month. ? ?Being addressed via TF  ? ?GOAL:  ? ?Patient will meet greater than or equal to 90% of their needs ? ?Progressing ? ?MONITOR:  ? ?PO intake, Supplement acceptance, Labs, Weight trends ? ?REASON FOR ASSESSMENT:  ? ?Consult ?LVAD Eval ? ?ASSESSMENT:  ? ?51 yo female admitted with acute on chronic CHF. PMH includes NICM EF 20-25%, CKD 3, CVA, HTN, HLD ? ?3/08 RHC: low cardiac output with elevated right/left filling pressure ?03/09 s/p Impella 5.5 placement ?03/10 Cortrak placed (tip in distal stomach) ?3/14 LVAD placed ?3/15 Extubated ? ?Extubated today. Currently NPO ? ?Cortrak no longer in place; came out during procedure ? ?Labs: reviewed ?Meds: reviewed ? ?Diet Order:   ?Diet Order   ? ? None  ? ?  ? ? ?EDUCATION NEEDS:  ? ?Education needs have been addressed ? ?Skin:  Skin Assessment: Reviewed RN Assessment (closed incision to chest) ? ?Last BM:  3/12 ? ?Height:  ? ?Ht Readings from Last 1 Encounters:  ?08/29/21 '5\' 3"'$  (1.6 m)  ? ? ?Weight:  ? ?Wt Readings from Last 1 Encounters:  ?09/04/21 110.3 kg  ? ? ?Ideal Body Weight:    ? ?BMI:  Body mass index is 43.08 kg/m?. ? ?Estimated Nutritional Needs:  ? ?Kcal:  1800-2000 kcals ? ?Protein:  100-115 g ? ?Fluid:  2L ? ?Kerman Passey MS, RDN, LDN, CNSC ?Registered Dietitian III ?Clinical Nutrition ?RD Pager and On-Call Pager Number Located in [place]  ? ?

## 2021-09-04 NOTE — Progress Notes (Addendum)
LVAD Coordinator Rounding Note: ? ?Admitted 08/26/21 due to acute on chronic systolic heart failure. Receiving ongoing treatment for hyperthyroidism with Methimazole and steroids. On 08/29/21 Impella 5.5 placed.  ? ?HM III LVAD implanted on 09/03/21 by Dr Prescott Gum under destination therapy criteria due to elevated BMI.  ? ?Pt remains intubated this morning. She is alert and follows commands. Plan for possible extubation later today as long as CT output is <100 cc for three hours, tolerates nitric want to 2-3 ppm, and vent wean per Dr Prescott Gum.  ? ?Received Lasix 40 mg IV this morning. Good urine output noted. On Milrinone 0.375 mcg/kg/min. Co-ox 61.4. See list of other drips below. ? ?Received IV Amiodarone bolus overnight for increased PVCs. BP dropped with PVCs and bolus, but has since stabilized. No VT noted.  ? ?Received Factor 7, 1 PRBC, and 1 platelet overnight for bleeding via chest tubes. Hematoma noted at previous impella site. JP drain in place. Hgb 6.5, on recheck Hgb 7.5. Will give an additional PRBC per Dr Prescott Gum.  ? ?Speed increased to 5300 per Dr [name].   ? ?She remains on methimazole and IV Solumedrol for hyperthyroidism. Will need endocrinology f/u appt at discharge.  ? ?No family currently at bedside.  ? ?Vital signs: ?Temp: 101.1 ?HR: 102 ST ?Doppler Pressure: 82 ?Arterial Line BP: 84/71 (76) ?O2 Sat: 100% on FiO2 50% peep 5  ?Wt: 243.2  lbs ? ?LVAD interrogation:  ?Speed: 5300 ?Flow: 3.9 ?Power:  3.7 w ?PI: 3.4  ? ?Alarms: none ?Events:  36 PI events overnight  ? ?Hematocrit: 20 ?Fixed speed: 5300 ?Low speed limit: 5000 ? ?Drive Line: Existing VAD dressing removed and site care performed using sterile technique. Drive line exit site cleaned with Chlora prep applicators x 2, allowed to dry, and gauze dressing with silver strip applied. Exit site not incorporated, the velour is fully implanted at exit site. 1 suture in place.  Small amount of dried bloody drainage noted on gauze and around exit  site. No redness, tenderness, foul odor or rash noted. Drive line anchor re-applied. Daily dressing changes using daily kit per VAD coordinator or nurse champion. Next dressing change due: 09/05/21.  ? ? ?Labs:  ?LDH trend: 391 ? ?INR trend: 0.8 ? ?WBC trend: 25.1 ? ?Hgb trend: 6.5>7.5 ? ?Anticoagulation Plan: ?-INR Goal: 2.0 - 2.5 ?-ASA Dose: 325 mg (until INR therapeutic)  ? ?Blood Products:  ?IntraOp: ? 4 PRBCs ? 4 FFP ? 2 Cryo ? 2 platelets ?DDAVP ? 1,400 cell saver ?Post Op: ?09/03/21: Factor 7 ? 1 PRBC ? 1 Platelet ?09/04/21: 1 PRBC ? ?Device: ?-Medtronic  ?-Therapies: OFF ? 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 ?Respiratory: remains intubated. Nitric 4 ppm.  ? ?Infection: ?08/31/21>> blood cultures>> no growth 3 days ?09/04/21>> blood cultures>> pending ?09/04/21>> sputum culture>> pending ? ?Drips: ?Epinephrine 10 mcg/min ?Levophed 8 mcg/min ?Vasopressin 0.02 units/min ?Milrinone 0.25 mcg/kg/min ?Amiodarone 30 mg/hr ?Insulin ?Precedex ? ?Patient Education: ?No family currently at bedside. Pt is intubated- education not appropriate at this time.  ? ?Plan/Recommendations:  ?1. Page VAD coordinators with VAD equipment and drive line concerns. ?2. Daily drive line dressing change per VAD coordinator or nurse champion using daily kit ? ?[name] RN ?VAD Coordinator  ?Office: [phone]  ?24/7 Pager: [phone]  ? ?  ?

## 2021-09-04 NOTE — Progress Notes (Signed)
Paged Prescott Gum MD in regards to patient's hemoglobin results and chest tube output being minimal. Orders received to transfuse 1 unit pRBC. As discussed earlier, as long as CT output is < 100 for three hours, wean nitric oxide and wean ventilator to try to extubate. ?

## 2021-09-04 NOTE — Procedures (Signed)
Extubation Procedure Note ? ?Patient Details:   ?Name: [name] ?DOB: 03-10-1971 ?MRN: [id number] ?  ?Airway Documentation:  ?  ?Vent end date: 09/04/21 Vent end time: 1620  ? ?Evaluation ? O2 sats: stable throughout ?Complications: No apparent complications ?Patient did tolerate procedure well. ?Bilateral Breath Sounds: Diminished, Rhonchi ?  ?Yes ?Pt NIF -28 cmH2O and VC 1.32L and was extubated with no apparent complications. Cuff leak was heard prior extubation and no signs of stridor at this time. Pt is currently stable and is able to speak. Pt is currently on 4L [place] with 2 ppm NO bleed in. RT will continue to monitor for any changes ? ?Felecia Jan ?09/04/2021, 4:36 PM ? ?

## 2021-09-05 ENCOUNTER — Inpatient Hospital Stay (HOSPITAL_COMMUNITY): Payer: BC Managed Care – PPO

## 2021-09-05 ENCOUNTER — Inpatient Hospital Stay: Payer: Self-pay

## 2021-09-05 ENCOUNTER — Encounter (HOSPITAL_COMMUNITY): Payer: Self-pay | Admitting: Cardiothoracic Surgery

## 2021-09-05 DIAGNOSIS — I428 Other cardiomyopathies: Secondary | ICD-10-CM | POA: Diagnosis not present

## 2021-09-05 DIAGNOSIS — I5023 Acute on chronic systolic (congestive) heart failure: Secondary | ICD-10-CM | POA: Diagnosis not present

## 2021-09-05 DIAGNOSIS — Z95811 Presence of heart assist device: Secondary | ICD-10-CM

## 2021-09-05 DIAGNOSIS — R570 Cardiogenic shock: Secondary | ICD-10-CM | POA: Diagnosis not present

## 2021-09-05 DIAGNOSIS — I509 Heart failure, unspecified: Secondary | ICD-10-CM | POA: Diagnosis not present

## 2021-09-05 LAB — CBC WITH DIFFERENTIAL/PLATELET
Abs Immature Granulocytes: 1.38 10*3/uL — ABNORMAL HIGH (ref 0.00–0.07)
Abs Immature Granulocytes: 1.66 10*3/uL — ABNORMAL HIGH (ref 0.00–0.07)
Basophils Absolute: 0.1 10*3/uL (ref 0.0–0.1)
Basophils Absolute: 0.1 10*3/uL (ref 0.0–0.1)
Basophils Relative: 0 %
Basophils Relative: 0 %
Eosinophils Absolute: 0 10*3/uL (ref 0.0–0.5)
Eosinophils Absolute: 0.2 10*3/uL (ref 0.0–0.5)
Eosinophils Relative: 0 %
Eosinophils Relative: 1 %
HCT: 25 % — ABNORMAL LOW (ref 36.0–46.0)
HCT: 27.2 % — ABNORMAL LOW (ref 36.0–46.0)
Hemoglobin: 8 g/dL — ABNORMAL LOW (ref 12.0–15.0)
Hemoglobin: 8.9 g/dL — ABNORMAL LOW (ref 12.0–15.0)
Immature Granulocytes: 5 %
Immature Granulocytes: 5 %
Lymphocytes Relative: 13 %
Lymphocytes Relative: 7 %
Lymphs Abs: 3.9 10*3/uL (ref 0.7–4.0)
Lymphs Abs: 2.2 10*3/uL (ref 0.7–4.0)
MCH: 28.3 pg (ref 26.0–34.0)
MCH: 28.7 pg (ref 26.0–34.0)
MCHC: 32 g/dL (ref 30.0–36.0)
MCHC: 32.7 g/dL (ref 30.0–36.0)
MCV: 88.3 fL (ref 80.0–100.0)
MCV: 87.7 fL (ref 80.0–100.0)
Monocytes Absolute: 3.3 10*3/uL — ABNORMAL HIGH (ref 0.1–1.0)
Monocytes Absolute: 2.9 10*3/uL — ABNORMAL HIGH (ref 0.1–1.0)
Monocytes Relative: 11 %
Monocytes Relative: 10 %
Neutro Abs: 20.8 10*3/uL — ABNORMAL HIGH (ref 1.7–7.7)
Neutro Abs: 23.4 10*3/uL — ABNORMAL HIGH (ref 1.7–7.7)
Neutrophils Relative %: 71 %
Neutrophils Relative %: 77 %
Platelets: 113 10*3/uL — ABNORMAL LOW (ref 150–400)
Platelets: 98 10*3/uL — ABNORMAL LOW (ref 150–400)
RBC: 2.83 MIL/uL — ABNORMAL LOW (ref 3.87–5.11)
RBC: 3.1 MIL/uL — ABNORMAL LOW (ref 3.87–5.11)
RDW: 21.2 % — ABNORMAL HIGH (ref 11.5–15.5)
RDW: 20.5 % — ABNORMAL HIGH (ref 11.5–15.5)
Smear Review: DECREASED
Smear Review: DECREASED
WBC: 29.5 10*3/uL — ABNORMAL HIGH (ref 4.0–10.5)
WBC: 30.5 10*3/uL — ABNORMAL HIGH (ref 4.0–10.5)
nRBC: 7 % — ABNORMAL HIGH (ref 0.0–0.2)
nRBC: 2.9 % — ABNORMAL HIGH (ref 0.0–0.2)

## 2021-09-05 LAB — COMPREHENSIVE METABOLIC PANEL
ALT: 27 U/L (ref 0–44)
AST: 85 U/L — ABNORMAL HIGH (ref 15–41)
Albumin: 2.8 g/dL — ABNORMAL LOW (ref 3.5–5.0)
Alkaline Phosphatase: 44 U/L (ref 38–126)
Anion gap: 11 (ref 5–15)
BUN: 27 mg/dL — ABNORMAL HIGH (ref 6–20)
CO2: 24 mmol/L (ref 22–32)
Calcium: 8.6 mg/dL — ABNORMAL LOW (ref 8.9–10.3)
Chloride: 98 mmol/L (ref 98–111)
Creatinine, Ser: 1.17 mg/dL — ABNORMAL HIGH (ref 0.44–1.00)
GFR, Estimated: 57 mL/min — ABNORMAL LOW (ref >60)
Glucose, Bld: 121 mg/dL — ABNORMAL HIGH (ref 70–99)
Potassium: 3.7 mmol/L (ref 3.5–5.1)
Sodium: 133 mmol/L — ABNORMAL LOW (ref 135–145)
Total Bilirubin: 2.1 mg/dL — ABNORMAL HIGH (ref 0.3–1.2)
Total Protein: 4.8 g/dL — ABNORMAL LOW (ref 6.5–8.1)

## 2021-09-05 LAB — COOXEMETRY PANEL
Carboxyhemoglobin: 1.1 % (ref 0.5–1.5)
Methemoglobin: <0.7 % (ref 0.0–1.5)
O2 Saturation: 55.9 %
Total hemoglobin: 8 g/dL — ABNORMAL LOW (ref 12.0–16.0)

## 2021-09-05 LAB — POCT I-STAT EG7
Acid-Base Excess: 0 mmol/L (ref 0.0–2.0)
Bicarbonate: 26.2 mmol/L (ref 20.0–28.0)
Calcium, Ion: 1.04 mmol/L — ABNORMAL LOW (ref 1.15–1.40)
HCT: 26 % — ABNORMAL LOW (ref 36.0–46.0)
Hemoglobin: 8.8 g/dL — ABNORMAL LOW (ref 12.0–15.0)
O2 Saturation: 68 %
Potassium: 3.4 mmol/L — ABNORMAL LOW (ref 3.5–5.1)
Sodium: 138 mmol/L (ref 135–145)
TCO2: 28 mmol/L (ref 22–32)
pCO2, Ven: 48.2 mmHg (ref 44–60)
pH, Ven: 7.343 (ref 7.25–7.43)
pO2, Ven: 38 mmHg (ref 32–45)

## 2021-09-05 LAB — CALCIUM, IONIZED: Calcium, Ionized, Serum: 4.2 mg/dL — ABNORMAL LOW (ref 4.5–5.6)

## 2021-09-05 LAB — GLUCOSE, CAPILLARY
Glucose-Capillary: 111 mg/dL — ABNORMAL HIGH (ref 70–99)
Glucose-Capillary: 117 mg/dL — ABNORMAL HIGH (ref 70–99)
Glucose-Capillary: 121 mg/dL — ABNORMAL HIGH (ref 70–99)
Glucose-Capillary: 124 mg/dL — ABNORMAL HIGH (ref 70–99)
Glucose-Capillary: 146 mg/dL — ABNORMAL HIGH (ref 70–99)
Glucose-Capillary: 148 mg/dL — ABNORMAL HIGH (ref 70–99)
Glucose-Capillary: 70 mg/dL (ref 70–99)
Glucose-Capillary: 74 mg/dL (ref 70–99)
Glucose-Capillary: 93 mg/dL (ref 70–99)
Glucose-Capillary: 99 mg/dL (ref 70–99)

## 2021-09-05 LAB — PREPARE RBC (CROSSMATCH)

## 2021-09-05 LAB — BASIC METABOLIC PANEL
Anion gap: 12 (ref 5–15)
BUN: 29 mg/dL — ABNORMAL HIGH (ref 6–20)
CO2: 23 mmol/L (ref 22–32)
Calcium: 8.3 mg/dL — ABNORMAL LOW (ref 8.9–10.3)
Chloride: 96 mmol/L — ABNORMAL LOW (ref 98–111)
Creatinine, Ser: 1.06 mg/dL — ABNORMAL HIGH (ref 0.44–1.00)
GFR, Estimated: >60 mL/min (ref >60)
Glucose, Bld: 148 mg/dL — ABNORMAL HIGH (ref 70–99)
Potassium: 3.6 mmol/L (ref 3.5–5.1)
Sodium: 131 mmol/L — ABNORMAL LOW (ref 135–145)

## 2021-09-05 LAB — PREPARE PLATELET PHERESIS: Unit division: 0

## 2021-09-05 LAB — POCT I-STAT 7, (LYTES, BLD GAS, ICA,H+H)
Acid-Base Excess: 1 mmol/L (ref 0.0–2.0)
Bicarbonate: 25.4 mmol/L (ref 20.0–28.0)
Calcium, Ion: 1.24 mmol/L (ref 1.15–1.40)
HCT: 27 % — ABNORMAL LOW (ref 36.0–46.0)
Hemoglobin: 9.2 g/dL — ABNORMAL LOW (ref 12.0–15.0)
O2 Saturation: 99 %
Patient temperature: 97.8
Potassium: 3.7 mmol/L (ref 3.5–5.1)
Sodium: 132 mmol/L — ABNORMAL LOW (ref 135–145)
TCO2: 26 mmol/L (ref 22–32)
pCO2 arterial: 36.7 mmHg (ref 32–48)
pH, Arterial: 7.445 (ref 7.35–7.45)
pO2, Arterial: 110 mmHg — ABNORMAL HIGH (ref 83–108)

## 2021-09-05 LAB — MAGNESIUM: Magnesium: 2.2 mg/dL (ref 1.7–2.4)

## 2021-09-05 LAB — CULTURE, BLOOD (ROUTINE X 2)
Culture: NO GROWTH
Culture: NO GROWTH
Special Requests: ADEQUATE

## 2021-09-05 LAB — BPAM PLATELET PHERESIS
Blood Product Expiration Date: 202303172359
ISSUE DATE / TIME: 202303150508
Unit Type and Rh: 7300

## 2021-09-05 LAB — LACTATE DEHYDROGENASE: LDH: 485 U/L — ABNORMAL HIGH (ref 98–192)

## 2021-09-05 LAB — PROTIME-INR
INR: 1.6 — ABNORMAL HIGH (ref 0.8–1.2)
Prothrombin Time: 19 seconds — ABNORMAL HIGH (ref 11.4–15.2)

## 2021-09-05 LAB — PLATELET INHIBITION P2Y12

## 2021-09-05 LAB — PHOSPHORUS: Phosphorus: 4.1 mg/dL (ref 2.5–4.6)

## 2021-09-05 MED ORDER — POTASSIUM CHLORIDE 10 MEQ/50ML IV SOLN
10.0000 meq | INTRAVENOUS | Status: AC
  Administered 2021-09-05 (×3): 10 meq via INTRAVENOUS
  Filled 2021-09-05 (×3): qty 50

## 2021-09-05 MED ORDER — METHIMAZOLE 10 MG PO TABS
10.0000 mg | ORAL_TABLET | Freq: Every day | ORAL | Status: DC
Start: 2021-09-05 — End: 2021-09-20
  Administered 2021-09-05 – 2021-09-20 (×16): 10 mg via ORAL
  Filled 2021-09-05 (×17): qty 1

## 2021-09-05 MED ORDER — KETOROLAC TROMETHAMINE 15 MG/ML IJ SOLN
7.5000 mg | Freq: Once | INTRAMUSCULAR | Status: DC
Start: 2021-09-05 — End: 2021-09-05

## 2021-09-05 MED ORDER — ACETAMINOPHEN 160 MG/5ML PO SOLN: 1000.0000 mg | Freq: Four times a day (QID) | ORAL | Status: AC

## 2021-09-05 MED ORDER — TRAMADOL HCL 50 MG PO TABS
50.0000 mg | ORAL_TABLET | ORAL | Status: DC | PRN
  Administered 2021-09-05 – 2021-09-11 (×6): 50 mg via ORAL
  Filled 2021-09-05 (×6): qty 1

## 2021-09-05 MED ORDER — MIDAZOLAM HCL 2 MG/2ML IJ SOLN: 2.0000 mg | Freq: Four times a day (QID) | INTRAMUSCULAR | Status: DC | PRN

## 2021-09-05 MED ORDER — METHIMAZOLE 5 MG PO TABS
5.0000 mg | ORAL_TABLET | Freq: Every evening | ORAL | Status: DC
  Administered 2021-09-05 – 2021-09-19 (×15): 5 mg via ORAL
  Filled 2021-09-05 (×15): qty 1

## 2021-09-05 MED ORDER — MORPHINE SULFATE (PF) 2 MG/ML IV SOLN
2.0000 mg | INTRAVENOUS | Status: DC | PRN
  Administered 2021-09-07 – 2021-09-11 (×9): 2 mg via INTRAVENOUS
  Filled 2021-09-05 (×9): qty 1

## 2021-09-05 MED ORDER — BISACODYL 5 MG PO TBEC
10.0000 mg | DELAYED_RELEASE_TABLET | Freq: Every day | ORAL | Status: DC
  Administered 2021-09-05 – 2021-09-08 (×4): 10 mg via ORAL
  Filled 2021-09-05 (×7): qty 2

## 2021-09-05 MED ORDER — SODIUM CHLORIDE 0.9 % IV SOLN: INTRAVENOUS | Status: DC | PRN

## 2021-09-05 MED ORDER — SODIUM CHLORIDE 0.9% FLUSH
10.0000 mL | Freq: Two times a day (BID) | INTRAVENOUS | Status: DC
  Administered 2021-09-05 – 2021-09-07 (×4): 10 mL
  Administered 2021-09-07 – 2021-09-08 (×3): 20 mL

## 2021-09-05 MED ORDER — SODIUM CHLORIDE 0.9% IV SOLUTION: Freq: Once | INTRAVENOUS | Status: AC

## 2021-09-05 MED ORDER — ACETAMINOPHEN 500 MG PO TABS
1000.0000 mg | ORAL_TABLET | Freq: Four times a day (QID) | ORAL | Status: AC
  Administered 2021-09-05 – 2021-09-08 (×12): 1000 mg via ORAL
  Filled 2021-09-05 (×14): qty 2

## 2021-09-05 MED ORDER — WARFARIN SODIUM 2 MG PO TABS
2.0000 mg | ORAL_TABLET | Freq: Once | ORAL | Status: AC
  Administered 2021-09-05: 2 mg via ORAL
  Filled 2021-09-05: qty 1

## 2021-09-05 MED ORDER — INSULIN ASPART 100 UNIT/ML IJ SOLN
0.0000 [IU] | INTRAMUSCULAR | Status: DC
  Administered 2021-09-05 – 2021-09-09 (×8): 2 [IU] via SUBCUTANEOUS

## 2021-09-05 MED ORDER — ROSUVASTATIN CALCIUM 20 MG PO TABS
40.0000 mg | ORAL_TABLET | Freq: Every day | ORAL | Status: DC
Start: 2021-09-05 — End: 2021-09-20
  Administered 2021-09-05 – 2021-09-19 (×15): 40 mg via ORAL
  Filled 2021-09-05 (×16): qty 2

## 2021-09-05 MED ORDER — PANTOPRAZOLE SODIUM 40 MG PO TBEC
40.0000 mg | DELAYED_RELEASE_TABLET | Freq: Every day | ORAL | Status: DC
  Administered 2021-09-05 – 2021-09-20 (×16): 40 mg via ORAL
  Filled 2021-09-05 (×17): qty 1

## 2021-09-05 MED ORDER — POTASSIUM CHLORIDE 20 MEQ PO PACK: 40.0000 meq | PACK | Freq: Once | ORAL | Status: DC

## 2021-09-05 MED ORDER — SODIUM CHLORIDE 0.9% FLUSH: 10.0000 mL | INTRAVENOUS | Status: DC | PRN

## 2021-09-05 MED ORDER — WARFARIN - PHYSICIAN DOSING INPATIENT: Freq: Every day | Status: DC

## 2021-09-05 MED ORDER — LIDOCAINE HCL (PF) 1 % IJ SOLN
INTRAMUSCULAR | Status: AC
  Filled 2021-09-05: qty 5

## 2021-09-05 MED ORDER — OXYCODONE HCL 5 MG PO TABS
5.0000 mg | ORAL_TABLET | ORAL | Status: DC | PRN
  Administered 2021-09-05 – 2021-09-11 (×27): 10 mg via ORAL
  Administered 2021-09-11 – 2021-09-14 (×4): 5 mg via ORAL
  Administered 2021-09-14: 10 mg via ORAL
  Administered 2021-09-18 – 2021-09-20 (×6): 5 mg via ORAL
  Filled 2021-09-05: qty 1
  Filled 2021-09-05 (×3): qty 2
  Filled 2021-09-05: qty 1
  Filled 2021-09-05 (×2): qty 2
  Filled 2021-09-05: qty 1
  Filled 2021-09-05 (×3): qty 2
  Filled 2021-09-05: qty 1
  Filled 2021-09-05 (×2): qty 2
  Filled 2021-09-05: qty 1
  Filled 2021-09-05 (×7): qty 2
  Filled 2021-09-05: qty 1
  Filled 2021-09-05: qty 2
  Filled 2021-09-05: qty 1
  Filled 2021-09-05: qty 2
  Filled 2021-09-05: qty 1
  Filled 2021-09-05: qty 2
  Filled 2021-09-05: qty 1
  Filled 2021-09-05 (×9): qty 2
  Filled 2021-09-05: qty 1

## 2021-09-05 MED ORDER — KETOROLAC TROMETHAMINE 30 MG/ML IJ SOLN
30.0000 mg | Freq: Four times a day (QID) | INTRAMUSCULAR | Status: AC | PRN
  Administered 2021-09-05 (×2): 30 mg via INTRAVENOUS
  Filled 2021-09-05 (×2): qty 1

## 2021-09-05 MED ORDER — VANCOMYCIN HCL IN DEXTROSE 1-5 GM/200ML-% IV SOLN
1000.0000 mg | Freq: Two times a day (BID) | INTRAVENOUS | Status: AC
  Administered 2021-09-05 – 2021-09-06 (×2): 1000 mg via INTRAVENOUS
  Filled 2021-09-05 (×2): qty 200

## 2021-09-05 MED ORDER — INSULIN ASPART 100 UNIT/ML IJ SOLN: 0.0000 [IU] | INTRAMUSCULAR | Status: DC

## 2021-09-05 MED ORDER — AMIODARONE LOAD VIA INFUSION
150.0000 mg | Freq: Once | INTRAVENOUS | Status: AC
  Administered 2021-09-05: 150 mg via INTRAVENOUS
  Filled 2021-09-05: qty 83.34

## 2021-09-05 MED ORDER — DOCUSATE SODIUM 100 MG PO CAPS
100.0000 mg | ORAL_CAPSULE | Freq: Every day | ORAL | Status: DC
Start: 2021-09-05 — End: 2021-09-06
  Administered 2021-09-05: 100 mg via ORAL
  Filled 2021-09-05: qty 1

## 2021-09-05 MED ORDER — AMIODARONE IV BOLUS ONLY 150 MG/100ML
150.0000 mg | Freq: Once | INTRAVENOUS | Status: AC
  Administered 2021-09-05: 150 mg via INTRAVENOUS

## 2021-09-05 NOTE — Progress Notes (Signed)
T CTS PM rounds ? ?Patient maintaining sinus rhythm now after sustained VT around midday, converted to sinus after IV amiodarone bolus x2.  Hemodynamics were stable with LVAD flow 4.2 L during the episode. ? ?New A-line placed, PICC line placed.  Chest tubes remain but output is significantly improved.  Low-dose Coumadin started this p.m. ? ?Plan on mobilizing out of bed to chair tomorrow if no more issues with ventricular arrhythmias. ? ?P.m. labs stable, hemoglobin 9.2 after 1 unit of packed cells ?Patient's temperature is now apyrexic, continuing Maxipime and vancomycin for white count of 30.5.  Cultures no growth so far ?

## 2021-09-05 NOTE — Progress Notes (Signed)
Paged VAD Coordinator with concerns about manual doppler BPs not correlating with automatic cuff BPs now that arterial line has been removed. She spoke with [name] MD who gave the order to insert another arterial line. RRT notified and will try once PICC line is finished being placed. ?

## 2021-09-05 NOTE — Progress Notes (Signed)
?  Patient recently post VAD implant.  ? ?Called because patient in rapid monomorphic VT at 160. Not feeling well. Weak. SOB.  ? ?MAPs stable.  ? ?Had been given multiple amio boluse without significant improvement. ? ?Defib pads in place. ? ?Patient had ventricular epicardial lead still present. I placed the V lead in atrial slot on pacer and rapidly paced her ventricle with prompt conversion to sinus rhythm  ? ?CCT 35 mins.  ? ?Glori Bickers, MD  ?1:35 PM ? ?

## 2021-09-05 NOTE — Progress Notes (Signed)
NO turned off per MD and pt placed on 4L [place]. Pt tolerating well at this time, RN aware, MD aware, RT will continue to monitor.  ? ? ? 09/05/21 1530  ?INOMAX  ?NO (ppm) (S)  0 ppm  ?Adult Ventilator Measurements  ?SpO2 98 %  ? ? ?

## 2021-09-05 NOTE — Progress Notes (Signed)
OT Cancellation Note ? ?Patient Details ?Name: [name] ?MRN: [id number] ?DOB: [birth year] ? ? ?Cancelled Treatment:    Reason Eval/Treat Not Completed: Medical issues which prohibited therapy. Vtach. RN request hold. Will return as schedule allows.  ? ?[place] ?Nesha Counihan MSOT, OTR/L ?Acute Rehab ?Pager: [phone] ?Office: [phone] ?09/05/2021, 2:42 PM ?

## 2021-09-05 NOTE — Procedures (Signed)
Arterial Catheter Insertion Procedure Note ? ?[name]  ?[id number]  ?1970-12-03 ? ?Date:09/05/21  ?Time:10:55 AM  ? ? ?Provider Performing: Kennieth Rad  ? ? ?Procedure: Insertion of Arterial Line [phone]) with US guidance (27517)  ? ?Indication(s) ?Blood pressure monitoring and/or need for frequent ABGs ? ?Consent ?Risks of the procedure as well as the alternatives and risks of each were explained to the patient and/or caregiver.  Consent for the procedure was obtained and is signed in the bedside chart ? ?Anesthesia ?36m of lido 1% locally ? ? ?Time Out ?Verified patient identification, verified procedure, site/side was marked, verified correct patient position, special equipment/implants available, medications/allergies/relevant history reviewed, required imaging and test results available. ? ? ?Sterile Technique ?Maximal sterile technique including full sterile barrier drape, hand hygiene, sterile gown, sterile gloves, mask, hair covering, sterile ultrasound probe cover (if used). ? ? ?Procedure Description ?Area of catheter insertion was cleaned with chlorhexidine and draped in sterile fashion. With real-time ultrasound guidance an arterial catheter was placed into the right radial artery.  Appropriate arterial tracings confirmed on monitor.  Biopatch and sterile dressing applied.  ? ? ?Complications/Tolerance ?None; patient tolerated the procedure well. ? ? ?EBL ?Minimal ? ? ?Specimen(s) ?None ? ? ? ? ?BKennieth Rad ACNP ?[facility] ?09/05/2021, 10:56 AM ? ?See Amion for pager ?If no response to pager, please call PCCM consult pager ?After 7:00 pm call Elink   ? ? ?

## 2021-09-05 NOTE — Progress Notes (Signed)
Peripherally Inserted Central Catheter Placement ? ?The IV Nurse has discussed with the patient and/or persons authorized to consent for the patient, the purpose of this procedure and the potential benefits and risks involved with this procedure.  The benefits include less needle sticks, lab draws from the catheter, and the patient may be discharged home with the catheter. Risks include, but not limited to, infection, bleeding, blood clot (thrombus formation), and puncture of an artery; nerve damage and irregular heartbeat and possibility to perform a PICC exchange if needed/ordered by physician.  Alternatives to this procedure were also discussed.  Bard Power PICC patient education guide, fact sheet on infection prevention and patient information card has been provided to patient /or left at bedside.   ? ?PICC Placement Documentation  ?PICC Triple Lumen 89/38/10 Right Cephalic 41 cm 1 cm (Active)  ?Indication for Insertion or Continuance of Line Prolonged intravenous therapies 09/05/21 0857  ?Exposed Catheter (cm) 1 cm 09/05/21 0857  ?Site Assessment Clean, Dry, Intact 09/05/21 0857  ?Lumen #1 Status Blood return noted 09/05/21 0857  ?Lumen #2 Status Flushed;Saline locked;Blood return noted 09/05/21 0857  ?Lumen #3 Status Flushed;Saline locked;Blood return noted 09/05/21 0857  ?Dressing Type Securing device;Transparent 09/05/21 0857  ?Dressing Status Antimicrobial disc in place 09/05/21 0857  ?Dressing Intervention New dressing;Other (Comment) 09/05/21 0857  ?Dressing Change Due 09/12/21 09/05/21 0857  ? ?With previous PICC consent this admission ? ? ? ?Christella Noa Albarece ?09/05/2021, 8:59 AM ? ?

## 2021-09-05 NOTE — Progress Notes (Signed)
Art line removed due to being unable to read and pull back, RN notified. ?

## 2021-09-05 NOTE — Progress Notes (Signed)
PT Cancellation Note ? ?Patient Details ?Name: [name] ?MRN: [id number] ?DOB: 1970/12/01 ? ? ?Cancelled Treatment:    Reason Eval/Treat Not Completed: Patient not medically ready. Pt into Vtach this PM. Will defer PT eval and check back tomorrow.  ? ? ?Shary Decamp [facility] ?09/05/2021, 2:12 PM ?[facility] ?Acute Rehabilitation Services ?Pager [phone] ?Office [phone] ? ?

## 2021-09-05 NOTE — Progress Notes (Addendum)
HeartMate 3 Rounding Note ? ?Postop day  #2 following HeartMate 3 implantation March 14 ? ?Subjective:   ? ?Complains of pain at the LVAD pocket site ?Does not want a feeding tube replaced and will try to improve her oral nutrition ?Physical therapy ready to mobilize patient to chair today after PA catheter removed and new A-line placed-having difficulty assessing mean arterial pressure with cuff technique and Doppler ? ? ?LVAD INTERROGATION:  ?HeartMate 3LVAD:  Flow 4.2 liters/min, speed 5300, power 4.1, PI 3.5.  Controller  intact.  ? ?Objective:   ? ?Vital Signs:   ?Temp:  [99 ?F (37.2 ?C)-101.8 ?F (38.8 ?C)] 99.3 ?F (37.4 ?C) (03/16 1015) ?Pulse Rate:  [90-180] 113 (03/16 1015) ?Resp:  [16-43] 39 (03/16 1015) ?BP: (53-93)/(26-77) 61/41 (03/16 0945) ?SpO2:  [81 %-100 %] 100 % (03/16 1015) ?Arterial Line BP: (70-133)/(61-126) 78/74 (03/16 0415) ?FiO2 (%):  [40 %-50 %] 40 % (03/15 1600) ?Weight:  [408 kg] 111 kg (03/16 0550) ?Last BM Date : 09/01/21 ?Mean arterial Pressure 70 ? ?Intake/Output:  ? ?Intake/Output Summary (Last 24 hours) at 09/05/2021 1025 ?Last data filed at 09/05/2021 1000 ?Gross per 24 hour  ?Intake 4094.32 ml  ?Output 2360 ml  ?Net 1734.32 ml  ?  ? ?Physical Exam: ?General:  Well appearing. No resp difficulty, mild tachypnea ?HEENT: normal ?Neck: supple. JVP . Carotids 2+ bilat; no bruits. No lymphadenopathy or thryomegaly appreciated. L neck drain at Impella 5.5 site  minimal output ?Cor: Mechanical heart sounds with LVAD hum present. ?Lungs: scattered rales ?Abdomen: soft, nontender, nondistended. No hepatosplenomegaly. No bruits or masses. Good bowel sounds. ?Extremities: no cyanosis, clubbing, rash, edema ?Neuro: alert & orientedx3, cranial nerves grossly intact. moves all 4 extremities w/o difficulty. Affect pleasant ? ?Telemetry: sinus tach ? ?Labs: ?Basic Metabolic Panel: ?Recent Labs  ?Lab 09/01/21 ?0331 09/01/21 ?0341 09/01/21 ?1637 09/02/21 ?0311 09/02/21 ?1448 09/03/21 ?1611  09/03/21 ?1617 09/03/21 ?2059 09/04/21 ?0311 09/04/21 ?0403 09/04/21 ?1023 09/04/21 ?1553 09/04/21 ?1556 09/04/21 ?1722 09/05/21 ?0400  ?NA 131*   < >  --  135   < > 137   < > 135 136   < > 136 135 135 136 133*  ?K 3.7   < >  --  3.7   < > 3.7   < > 4.1 4.3   < > 3.9 3.8 3.9 3.8 3.7  ?CL 93*  --   --  94*   < > 101  --  100 100  --   --  98  --   --  98  ?CO2 30  --   --  33*   < > 28  --  24 26  --   --  24  --   --  24  ?GLUCOSE 107*  --   --  101*   < > 124*  --  160* 119*  --   --  120*  --   --  121*  ?BUN 26*  --   --  25*   < > 21*  --  22* 25*  --   --  27*  --   --  27*  ?CREATININE 1.00  --   --  0.91   < > 1.02*  --  1.04* 1.13*  --   --  1.19*  --   --  1.17*  ?CALCIUM 8.7*  --   --  9.1   < > 8.2*  --  8.9 8.5*  --   --  8.4*  --   --  8.6*  ?MG 2.3  --  2.3 2.3   < > 2.0  --  2.9* 2.5*  --   --  2.3  --   --  2.2  ?PHOS 3.9  --  4.3 4.6  --   --   --   --  6.1*  --   --   --   --   --  4.1  ? < > = values in this interval not displayed.  ? ? ?Liver Function Tests: ?Recent Labs  ?Lab 09/01/21 ?0331 09/04/21 ?0311 09/05/21 ?0400  ?AST 76* 92* 85*  ?ALT 65* 31 27  ?ALKPHOS 104 37* 44  ?BILITOT 1.0 1.5* 2.1*  ?PROT 5.7* 4.4* 4.8*  ?ALBUMIN 2.7* 3.0* 2.8*  ? ?No results for input(s): LIPASE, AMYLASE in the last 168 hours. ?No results for input(s): AMMONIA in the last 168 hours. ? ?CBC: ?Recent Labs  ?Lab 09/04/21 ?0277 09/04/21 ?0403 09/04/21 ?1017 09/04/21 ?1023 09/04/21 ?1553 09/04/21 ?1556 09/04/21 ?1722 09/04/21 ?2238 09/05/21 ?0400  ?WBC 25.1*  --  20.8*  --  20.9*  --   --  24.8* 29.5*  ?NEUTROABS 18.3*  --  15.6*  --  13.9*  --   --  17.0* 20.8*  ?HGB 6.7*   < > 7.5*   < > 8.3* 8.2* 8.2* 7.8* 8.0*  ?HCT 20.0*   < > 23.0*   < > 25.3* 24.0* 24.0* 23.3* 25.0*  ?MCV 82.6  --  85.2  --  85.2  --   --  86.6 88.3  ?PLT 83*  --  125*  --  110*  --   --  99* 113*  ? < > = values in this interval not displayed.  ? ? ?INR: ?Recent Labs  ?Lab 09/01/21 ?0331 09/03/21 ?1611 09/04/21 ?0311 09/05/21 ?0400  ?INR 1.2  1.5* 0.8 1.6*  ? ? ?Other results: ?EKG:  ? ?Imaging: ?DG Chest Port 1 View ? ?Result Date: 09/05/2021 ?CLINICAL DATA:  51 year old female with LVAD. EXAM: PORTABLE CHEST - 1 VIEW COMPARISON:  09/04/2021 FINDINGS: The mediastinal contours are unchanged. Unchanged cardiomegaly. Similar appearance of right IJ central venous catheter with indwelling pulmonary artery catheter, tip in a right lower lobar branch. Dual lead AICD in place with generator left chest wall with leads in unchanged position via subclavian approach. Bilateral thoracostomy tubes in unchanged position. LVAD in place, unchanged. Interval extubation. The lungs are clear bilaterally without evidence of focal consolidation, pleural effusion, or pneumothorax. No acute osseous abnormality. Median sternotomy wires in place. IMPRESSION: 1. Unchanged cardiomegaly with LVAD in place. 2. The lungs remain clear after interval extubation. 3. Remaining support devices and tubes in similar positions. Electronically Signed   By: Ruthann Cancer M.D.   On: 09/05/2021 08:21  ? ?DG Chest Port 1 View ? ?Result Date: 09/04/2021 ?CLINICAL DATA:  LVAD EXAM: PORTABLE CHEST 1 VIEW COMPARISON:  09/03/2021 FINDINGS: AP portable radiograph with extensive support apparatus. Endotracheal tube has been advanced, tip now just above the carina, no greater than 1 cm. Other support apparatus is unchanged, including esophagogastric tube, tip and side port below the diaphragm, right neck pulmonary vascular catheter, tip directed over the right right lower lobe, LVAD, and bilateral chest tubes. Gross cardiomegaly. Left chest multi lead pacer. No acute abnormality of the lungs. IMPRESSION: 1. Endotracheal tube has been advanced, tip now just above the carina, no greater than 1 cm. 2. Otherwise unchanged AP portable radiograph, extensive support apparatus including  LVAD unchanged. 3.  No acute abnormality of the lungs. Electronically Signed   By: Delanna Ahmadi M.D.   On: 09/04/2021 08:23   ? ?DG Chest Portable 1 View ? ?Result Date: 09/03/2021 ?CLINICAL DATA:  Evaluate for pneumothorax. LVAD. Removal of Impella. EXAM: PORTABLE CHEST 1 VIEW COMPARISON:  Chest x-ray 09/02/2021 FINDINGS: Endotracheal tube is 4 cm above the carina. Enteric tube extends below the diaphragm. Swan-Ganz catheter tip projects over the right main pulmonary artery, unchanged. Impella has been removed. There is a new left ventricular assist device in place. Left-sided pacemaker is unchanged in position. New bilateral chest tubes are in place. There is no pneumothorax visualized. The lungs are clear. No pleural effusion. No acute fractures. IMPRESSION: New postsurgical changes. Stable cardiomegaly. The lungs are clear.  No pneumothorax. 1. Electronically Signed   By: Ronney Asters M.D.   On: 09/03/2021 15:43  ? ?[name] EKG SITE RITE ? ?Result Date: 09/05/2021 ?If [employer] not attached, placement could not be confirmed due to current cardiac rhythm.  ? ? ?Medications:   ? ? ?Scheduled Medications: ? (feeding supplement) PROSource Plus  60 mL Oral TID BM  ? sodium chloride   Intravenous Once  ? sodium chloride   Intravenous Once  ? acetaminophen  1,000 mg Oral Q6H  ? Or  ? acetaminophen (TYLENOL) oral liquid 160 mg/5 mL  1,000 mg Oral Q6H  ? aspirin EC  325 mg Oral Daily  ? Or  ? aspirin  300 mg Rectal Daily  ? bisacodyl  10 mg Oral Daily  ? Chlorhexidine Gluconate Cloth  6 each Topical Daily  ? Chlorhexidine Gluconate Cloth  6 each Topical Daily  ? docusate sodium  100 mg Oral Daily  ? feeding supplement (PROSource TF)  45 mL Per Tube Daily  ? furosemide  40 mg Intravenous BID  ? insulin aspart  0-24 Units Subcutaneous Q4H  ? methimazole  10 mg Oral Daily  ? methimazole  5 mg Oral QPM  ? methylPREDNISolone (SOLU-MEDROL) injection  20 mg Intravenous Daily  ? pantoprazole  40 mg Oral Daily  ? quiniDINE gluconate  324 mg Oral BID  ? rosuvastatin  40 mg Oral QHS  ? sodium chloride flush  10-40 mL Intracatheter Q12H  ? sodium chloride  flush  10-40 mL Intracatheter Q12H  ? sodium chloride flush  3 mL Intravenous Q12H  ? ? ?Infusions: ? sodium chloride Stopped (09/05/21 0940)  ? sodium chloride    ? sodium chloride    ? sodium chloride    ? amiodaro

## 2021-09-05 NOTE — Progress Notes (Signed)
Patient taking a nap and suddenly went into wide complex tachycardic rhythm, rate in the 160s. Patient attached to Angela Nevin MD at bedside and [name] MD paged. Orders received to bolus 150 mg of amiodarone and to increase drip rate to 60. After bolus, patient still in rhythm. 15 minutes later, patient still in rhythm, so another 150 mg bolus of amiodarone was given. During the bolus, Bensimhon MD rapid V-paced and the patient converted to sinus rhythm in the 90s. Prescott Gum MD notified of all events. Patient stable at this time. ?

## 2021-09-05 NOTE — Progress Notes (Addendum)
HeartMate 2 Rounding Note ? ?Subjective:   ? ?- 3/14: HM3 LVAD placement with removal of Impella 5.5  ?- 3/15: Speed increased to 5300 rpm. Given 1UPRBC. Febrile. Cultures obtained. Extubated.  ? ?Today, she is on epinephrine 10, milrinone 0.375, NE 6, and NO 2 PPM  ? ?She remains on amiodarone gtt, no VT.  ? ?She remains on methimazole and IV Solumedrol for hyperthyroidism.  ? ?Swan numbers: ?CO-OX 56%.  ?CVP 10-12 ?PA 53/27 ?CO 7 ?CI 3.4 ? ?Had a hard time sitting on the side of the bed. Complaining of weakness. Hungry today.  ? ?LVAD INTERROGATION:  ?HeartMate II LVAD:  Flow 4.3 liters/min, speed 5300, power 3.9, PI 3.5.  Few PI events overnight.  ?LDH 391 ? ?Objective:   ? ?Vital Signs:   ?Temp:  [99 ?F (37.2 ?C)-101.8 ?F (38.8 ?C)] 99 ?F (37.2 ?C) (03/16 0700) ?Pulse Rate:  [98-180] 109 (03/16 0700) ?Resp:  [16-40] 30 (03/16 0700) ?BP: (70-93)/(35-75) 79/67 (03/16 0700) ?SpO2:  [81 %-100 %] 97 % (03/16 0700) ?Arterial Line BP: (70-133)/(61-126) 78/74 (03/16 0415) ?FiO2 (%):  [40 %-50 %] 40 % (03/15 1600) ?Weight:  [412 kg] 111 kg (03/16 0550) ?Last BM Date : 09/01/21 ?Mean arterial Pressure 70s  ? ?Intake/Output:  ? ?Intake/Output Summary (Last 24 hours) at 09/05/2021 0716 ?Last data filed at 09/05/2021 0700 ?Gross per 24 hour  ?Intake 4595.95 ml  ?Output 3060 ml  ?Net 1535.95 ml  ?  ? ? ?Physical Exam: ?GENERAL: No acute distress. ?HEENT: normal  ?NECK: Supple, JVP 11-12   2+ bilaterally, no bruits.  No lymphadenopathy or thyromegaly appreciated.  RIJ swan  ?CARDIAC:  Mechanical heart sounds with LVAD hum present.  ?LUNGS:  Coarse . Decreased in the bases ABDOMEN:  Soft, round, nontender, positive bowel sounds x4.     ?LVAD exit site: well-healed and incorporated.  Dressing dry and intact.  No erythema or drainage.  Stabilization device present and accurately applied.  Driveline dressing is being changed daily per sterile technique. ?EXTREMITIES:  Warm and dry, no cyanosis, clubbing, rash or edema  ?NEUROLOGIC:   Alert and oriented x 3.    No aphasia.  No dysarthria.  Affect pleasant.    ?GU: foley ? ?Telemetry: ST 100s  ? ?Labs: ?Basic Metabolic Panel: ?Recent Labs  ?Lab 09/01/21 ?0331 09/01/21 ?0341 09/01/21 ?1637 09/02/21 ?0311 09/02/21 ?8786 09/03/21 ?1611 09/03/21 ?1617 09/03/21 ?2059 09/04/21 ?0311 09/04/21 ?0403 09/04/21 ?1023 09/04/21 ?1553 09/04/21 ?1556 09/04/21 ?1722 09/05/21 ?0400  ?NA 131*   < >  --  135   < > 137   < > 135 136   < > 136 135 135 136 133*  ?K 3.7   < >  --  3.7   < > 3.7   < > 4.1 4.3   < > 3.9 3.8 3.9 3.8 3.7  ?CL 93*  --   --  94*   < > 101  --  100 100  --   --  98  --   --  98  ?CO2 30  --   --  33*   < > 28  --  24 26  --   --  24  --   --  24  ?GLUCOSE 107*  --   --  101*   < > 124*  --  160* 119*  --   --  120*  --   --  121*  ?BUN 26*  --   --  25*   < >  21*  --  22* 25*  --   --  27*  --   --  27*  ?CREATININE 1.00  --   --  0.91   < > 1.02*  --  1.04* 1.13*  --   --  1.19*  --   --  1.17*  ?CALCIUM 8.7*  --   --  9.1   < > 8.2*  --  8.9 8.5*  --   --  8.4*  --   --  8.6*  ?MG 2.3  --  2.3 2.3   < > 2.0  --  2.9* 2.5*  --   --  2.3  --   --  2.2  ?PHOS 3.9  --  4.3 4.6  --   --   --   --  6.1*  --   --   --   --   --  4.1  ? < > = values in this interval not displayed.  ? ? ?Liver Function Tests: ?Recent Labs  ?Lab 09/01/21 ?0331 09/04/21 ?0311 09/05/21 ?0400  ?AST 76* 92* 85*  ?ALT 65* 31 27  ?ALKPHOS 104 37* 44  ?BILITOT 1.0 1.5* 2.1*  ?PROT 5.7* 4.4* 4.8*  ?ALBUMIN 2.7* 3.0* 2.8*  ? ?No results for input(s): LIPASE, AMYLASE in the last 168 hours. ?No results for input(s): AMMONIA in the last 168 hours. ? ?CBC: ?Recent Labs  ?Lab 09/04/21 ?9518 09/04/21 ?0403 09/04/21 ?1017 09/04/21 ?1023 09/04/21 ?1553 09/04/21 ?1556 09/04/21 ?1722 09/04/21 ?2238 09/05/21 ?0400  ?WBC 25.1*  --  20.8*  --  20.9*  --   --  24.8* 29.5*  ?NEUTROABS 18.3*  --  15.6*  --  13.9*  --   --  17.0* 20.8*  ?HGB 6.7*   < > 7.5*   < > 8.3* 8.2* 8.2* 7.8* 8.0*  ?HCT 20.0*   < > 23.0*   < > 25.3* 24.0* 24.0* 23.3*  25.0*  ?MCV 82.6  --  85.2  --  85.2  --   --  86.6 88.3  ?PLT 83*  --  125*  --  110*  --   --  99* 113*  ? < > = values in this interval not displayed.  ? ? ?INR: ?Recent Labs  ?Lab 09/01/21 ?0331 09/03/21 ?1611 09/04/21 ?0311 09/05/21 ?0400  ?INR 1.2 1.5* 0.8 1.6*  ? ? ?Other results: ?EKG:  ? ?Imaging: ?DG Chest Port 1 View ? ?Result Date: 09/04/2021 ?CLINICAL DATA:  LVAD EXAM: PORTABLE CHEST 1 VIEW COMPARISON:  09/03/2021 FINDINGS: AP portable radiograph with extensive support apparatus. Endotracheal tube has been advanced, tip now just above the carina, no greater than 1 cm. Other support apparatus is unchanged, including esophagogastric tube, tip and side port below the diaphragm, right neck pulmonary vascular catheter, tip directed over the right right lower lobe, LVAD, and bilateral chest tubes. Gross cardiomegaly. Left chest multi lead pacer. No acute abnormality of the lungs. IMPRESSION: 1. Endotracheal tube has been advanced, tip now just above the carina, no greater than 1 cm. 2. Otherwise unchanged AP portable radiograph, extensive support apparatus including LVAD unchanged. 3.  No acute abnormality of the lungs. Electronically Signed   By: Delanna Ahmadi M.D.   On: 09/04/2021 08:23  ? ?DG Chest Portable 1 View ? ?Result Date: 09/03/2021 ?CLINICAL DATA:  Evaluate for pneumothorax. LVAD. Removal of Impella. EXAM: PORTABLE CHEST 1 VIEW COMPARISON:  Chest x-ray 09/02/2021 FINDINGS: Endotracheal tube is 4 cm above the carina. Enteric tube extends  below the diaphragm. Swan-Ganz catheter tip projects over the right main pulmonary artery, unchanged. Impella has been removed. There is a new left ventricular assist device in place. Left-sided pacemaker is unchanged in position. New bilateral chest tubes are in place. There is no pneumothorax visualized. The lungs are clear. No pleural effusion. No acute fractures. IMPRESSION: New postsurgical changes. Stable cardiomegaly. The lungs are clear.  No pneumothorax. 1.  Electronically Signed   By: Ronney Asters M.D.   On: 09/03/2021 15:43   ? ? ?Medications:   ? ? ?Scheduled Medications: ? (feeding supplement) PROSource Plus  60 mL Oral TID BM  ? sodium chloride   Intravenous Once  ? sodium chloride   Intravenous Once  ? acetaminophen  1,000 mg Oral Q6H  ? Or  ? acetaminophen (TYLENOL) oral liquid 160 mg/5 mL  1,000 mg Per Tube Q6H  ? aspirin EC  325 mg Oral Daily  ? Or  ? aspirin  324 mg Per Tube Daily  ? Or  ? aspirin  300 mg Rectal Daily  ? bisacodyl  10 mg Rectal Daily  ? Chlorhexidine Gluconate Cloth  6 each Topical Daily  ? Chlorhexidine Gluconate Cloth  6 each Topical Daily  ? docusate  100 mg Per Tube Daily  ? feeding supplement (PROSource TF)  45 mL Per Tube Daily  ? furosemide  40 mg Intravenous BID  ? methimazole  10 mg Per Tube Daily  ? methimazole  5 mg Per Tube QPM  ? methylPREDNISolone (SOLU-MEDROL) injection  20 mg Intravenous Daily  ? pantoprazole sodium  40 mg Per Tube Daily  ? quiniDINE gluconate  324 mg Oral BID  ? rosuvastatin  40 mg Per Tube QHS  ? sodium chloride flush  10-40 mL Intracatheter Q12H  ? sodium chloride flush  3 mL Intravenous Q12H  ? ? ?Infusions: ? sodium chloride Stopped (09/05/21 3785)  ? sodium chloride    ? sodium chloride    ? amiodarone 30 mg/hr (09/05/21 0700)  ? ceFEPime (MAXIPIME) IV 200 mL/hr at 09/05/21 0700  ? dexmedetomidine (PRECEDEX) IV infusion Stopped (09/04/21 1603)  ? epinephrine 10 mcg/min (09/05/21 0700)  ? insulin 0.7 Units/hr (09/05/21 0700)  ? lactated ringers    ? lactated ringers 20 mL/hr at 09/05/21 0700  ? milrinone 0.375 mcg/kg/min (09/05/21 0700)  ? norepinephrine (LEVOPHED) Adult infusion 6 mcg/min (09/05/21 0700)  ? potassium chloride    ? vasopressin Stopped (09/04/21 1322)  ? ? ?PRN Medications: ?sodium chloride, dextrose, midazolam, morphine injection, ondansetron (ZOFRAN) IV, oxyCODONE, sodium chloride flush, sodium chloride flush, traMADol ? ? ? ?Plan/Discussion:   ? ? 1. Acute on chronic systolic HF:  Nonischemic cardiomyopathy.  She has a Medtronic ICD. Cause for cardiomyopathy is uncertain, initially identified peri-partum (after son's birth), so cannot rule out peri-partum CMP.  On and off, she has had

## 2021-09-05 NOTE — Progress Notes (Signed)
LVAD Coordinator Rounding Note: ? ?Admitted 08/26/21 due to acute on chronic systolic heart failure. Receiving ongoing treatment for hyperthyroidism with Methimazole and steroids. On 08/29/21 Impella 5.5 placed.  ? ?HM III LVAD implanted on 09/03/21 by Dr Prescott Gum under destination therapy criteria due to elevated BMI.  ? ?Pt laying in bed upon my arrival. Reports sternal pain this morning. Tolerating speed increase to 5300. Pt refusing feeding tube. Encouraged to maintain good PO intake to ensure feeding tube not necessary. She verbalized understanding.    ? ?Received call from bedside RN. Doppler and cuff pressure are not correlating, and are difficult to obtain. Brachial art line removed overnight because it clotted off. RT at beside currently replacing arterial line.  ? ?Right upper arm PICC placed this morning.  ? ?Continues on Milrinone 0.375 mcg/kg/min. Co-ox 56.  ? ?Hgb 8.0. Receiving 1 PRBC currently. WBC 29.5, afebrile this morning. Receiving prophylactic Cefepime and Vancomycin. Blood cultures pending.  ? ?She remains on methimazole and IV Solumedrol for hyperthyroidism. Will need endocrinology f/u appt at discharge.  ? ?This afternoon pt went into VT rate 160s. She received 2 IV Amiodarone 150 mg boluses. Dr Haroldine Laws came to bedside and was able to rapid atrial pace pt back to NSR rate 90s-105 using temp pacing wires. Pt's BP stable throughout episode. Will leave Amiodarone gtt at 60 mg/hr per Dr [name].   ? ?Pt's husband observed dressing change today. See documentation below.  ? ?Vital signs: ?Temp: 99.3 ?HR: 102 ST ?Doppler Pressure: 55 ?Arterial BP:  ?Auto cuff BP: not correlating ?O2 Sat: 98% 4 L 1 ppm Nitric ?Wt: 243.2>244.7  lbs ? ?LVAD interrogation:  ?Speed: 5300 ?Flow: 4.2 ?Power:  3.8 w ?PI: 3.4 ? ?Alarms: none ?Events: none ? ?Hematocrit: 25 ?Fixed speed: 5300 ?Low speed limit: 5000 ? ?Drive Line: Existing VAD dressing removed and site care performed using sterile technique. Drive line exit site  cleaned with Chlora prep applicators x 2, allowed to dry, and gauze dressing with silver strip applied. Exit site not incorporated, the velour is fully implanted at exit site. 1 suture in place. No redness, drainage, tenderness, foul odor or rash noted. Drive line anchor re-applied. Daily dressing changes using daily kit per VAD coordinator or nurse champion. Next dressing change due: 09/06/21.  ? ? ? ?Labs:  ?LDH trend: 391>485 ? ?INR trend: 0.8>1.6 ? ?WBC trend: 25.1>29.5 ? ?Hgb trend: 6.5>7.5>8.0 ? ?Anticoagulation Plan: ?-INR Goal: 2.0 - 2.5 ?-ASA Dose: 325 mg (until INR therapeutic)  ? ?Blood Products:  ?IntraOp: ? 4 PRBCs ? 4 FFP ? 2 Cryo ? 2 platelets ?DDAVP ? 1,400 cell saver ?Post Op: ?09/03/21: Factor 7 ? 1 PRBC ? 1 Platelet ?09/04/21: 1 PRBC ?09/05/21: 1 PRBC ? ?Device: ?-Medtronic  ?-Therapies: OFF ? 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 ?Respiratory: extubated 09/04/21- on 4L  Nitric 4 ppm.  ? ?Infection: ?08/31/21>> blood cultures>> no growth 3 days ?09/04/21>> blood cultures>> pending ?09/04/21>> sputum culture>> moderate WBC, no organisms seen; final pending ? ?Drips: ?Epinephrine 10 mcg/min ?Levophed 8 mcg/min ?Milrinone 0.25 mcg/kg/min ?Amiodarone 60 mg/hr ?Vasopressin- off ? ?Patient Education: ?Demonstrated and verbalized drive line dressing change for patient and husband. Discussed importance of sterile technique, and sterile gloves/cap.  ?Further pt education not appropriate at this time due to pt in pain, anxiety, and rhythm change.  ? ?Plan/Recommendations:  ?1. Page VAD coordinators with VAD equipment and drive line concerns. ?2. Daily drive line dressing change per VAD coordinator or nurse champion  using daily kit ? ?[name] RN ?VAD Coordinator  ?Office: [phone]  ?24/7 Pager: [phone]  ? ?  ?

## 2021-09-06 ENCOUNTER — Inpatient Hospital Stay (HOSPITAL_COMMUNITY): Payer: BC Managed Care – PPO

## 2021-09-06 DIAGNOSIS — Z95811 Presence of heart assist device: Secondary | ICD-10-CM | POA: Diagnosis not present

## 2021-09-06 DIAGNOSIS — I5023 Acute on chronic systolic (congestive) heart failure: Secondary | ICD-10-CM | POA: Diagnosis not present

## 2021-09-06 DIAGNOSIS — I428 Other cardiomyopathies: Secondary | ICD-10-CM | POA: Diagnosis not present

## 2021-09-06 DIAGNOSIS — R570 Cardiogenic shock: Secondary | ICD-10-CM | POA: Diagnosis not present

## 2021-09-06 LAB — TYPE AND SCREEN
ABO/RH(D): B POS
Antibody Screen: NEGATIVE
Unit division: 0
Unit division: 0
Unit division: 0
Unit division: 0
Unit division: 0
Unit division: 0
Unit division: 0
Unit division: 0
Unit division: 0
Unit division: 0

## 2021-09-06 LAB — CBC WITH DIFFERENTIAL/PLATELET
Abs Immature Granulocytes: 1.93 10*3/uL — ABNORMAL HIGH (ref 0.00–0.07)
Abs Immature Granulocytes: 1.31 10*3/uL — ABNORMAL HIGH (ref 0.00–0.07)
Basophils Absolute: 0.1 10*3/uL (ref 0.0–0.1)
Basophils Absolute: 0.1 10*3/uL (ref 0.0–0.1)
Basophils Relative: 0 %
Basophils Relative: 0 %
Eosinophils Absolute: 0 10*3/uL (ref 0.0–0.5)
Eosinophils Absolute: 0.1 10*3/uL (ref 0.0–0.5)
Eosinophils Relative: 0 %
Eosinophils Relative: 0 %
HCT: 26.3 % — ABNORMAL LOW (ref 36.0–46.0)
HCT: 26.9 % — ABNORMAL LOW (ref 36.0–46.0)
Hemoglobin: 8.8 g/dL — ABNORMAL LOW (ref 12.0–15.0)
Hemoglobin: 8.8 g/dL — ABNORMAL LOW (ref 12.0–15.0)
Immature Granulocytes: 6 %
Immature Granulocytes: 5 %
Lymphocytes Relative: 7 %
Lymphocytes Relative: 4 %
Lymphs Abs: 2.1 10*3/uL (ref 0.7–4.0)
Lymphs Abs: 1.1 10*3/uL (ref 0.7–4.0)
MCH: 29.3 pg (ref 26.0–34.0)
MCH: 29 pg (ref 26.0–34.0)
MCHC: 33.5 g/dL (ref 30.0–36.0)
MCHC: 32.7 g/dL (ref 30.0–36.0)
MCV: 87.7 fL (ref 80.0–100.0)
MCV: 88.8 fL (ref 80.0–100.0)
Monocytes Absolute: 2.9 10*3/uL — ABNORMAL HIGH (ref 0.1–1.0)
Monocytes Absolute: 2 10*3/uL — ABNORMAL HIGH (ref 0.1–1.0)
Monocytes Relative: 9 %
Monocytes Relative: 7 %
Neutro Abs: 23.8 10*3/uL — ABNORMAL HIGH (ref 1.7–7.7)
Neutro Abs: 22.1 10*3/uL — ABNORMAL HIGH (ref 1.7–7.7)
Neutrophils Relative %: 78 %
Neutrophils Relative %: 84 %
Platelets: 106 10*3/uL — ABNORMAL LOW (ref 150–400)
Platelets: 116 10*3/uL — ABNORMAL LOW (ref 150–400)
RBC: 3 MIL/uL — ABNORMAL LOW (ref 3.87–5.11)
RBC: 3.03 MIL/uL — ABNORMAL LOW (ref 3.87–5.11)
RDW: 21 % — ABNORMAL HIGH (ref 11.5–15.5)
RDW: 21.2 % — ABNORMAL HIGH (ref 11.5–15.5)
Smear Review: DECREASED
WBC: 30.8 10*3/uL — ABNORMAL HIGH (ref 4.0–10.5)
WBC: 26.6 10*3/uL — ABNORMAL HIGH (ref 4.0–10.5)
nRBC: 2.5 % — ABNORMAL HIGH (ref 0.0–0.2)
nRBC: 1.7 % — ABNORMAL HIGH (ref 0.0–0.2)

## 2021-09-06 LAB — COMPREHENSIVE METABOLIC PANEL
ALT: 25 U/L (ref 0–44)
AST: 53 U/L — ABNORMAL HIGH (ref 15–41)
Albumin: 2.5 g/dL — ABNORMAL LOW (ref 3.5–5.0)
Alkaline Phosphatase: 60 U/L (ref 38–126)
Anion gap: 7 (ref 5–15)
BUN: 30 mg/dL — ABNORMAL HIGH (ref 6–20)
CO2: 24 mmol/L (ref 22–32)
Calcium: 8.6 mg/dL — ABNORMAL LOW (ref 8.9–10.3)
Chloride: 99 mmol/L (ref 98–111)
Creatinine, Ser: 0.94 mg/dL (ref 0.44–1.00)
GFR, Estimated: >60 mL/min (ref >60)
Glucose, Bld: 105 mg/dL — ABNORMAL HIGH (ref 70–99)
Potassium: 3.8 mmol/L (ref 3.5–5.1)
Sodium: 130 mmol/L — ABNORMAL LOW (ref 135–145)
Total Bilirubin: 1.6 mg/dL — ABNORMAL HIGH (ref 0.3–1.2)
Total Protein: 5 g/dL — ABNORMAL LOW (ref 6.5–8.1)

## 2021-09-06 LAB — BPAM RBC
Blood Product Expiration Date: 202304072359
Blood Product Expiration Date: 202304072359
Blood Product Expiration Date: 202304072359
Blood Product Expiration Date: 202304072359
Blood Product Expiration Date: 202304132359
Blood Product Expiration Date: 202304132359
Blood Product Expiration Date: 202304142359
Blood Product Expiration Date: 202304142359
Blood Product Expiration Date: 202304172359
Blood Product Expiration Date: 202304172359
ISSUE DATE / TIME: 202303140750
ISSUE DATE / TIME: 202303140750
ISSUE DATE / TIME: 202303140750
ISSUE DATE / TIME: 202303140750
ISSUE DATE / TIME: 202303141652
ISSUE DATE / TIME: 202303150441
ISSUE DATE / TIME: 202303151203
ISSUE DATE / TIME: 202303151639
ISSUE DATE / TIME: 202303160922
ISSUE DATE / TIME: 202303160924
Unit Type and Rh: 7300
Unit Type and Rh: 7300
Unit Type and Rh: 7300
Unit Type and Rh: 7300
Unit Type and Rh: 7300
Unit Type and Rh: 7300
Unit Type and Rh: 7300
Unit Type and Rh: 7300
Unit Type and Rh: 7300
Unit Type and Rh: 7300

## 2021-09-06 LAB — BASIC METABOLIC PANEL
Anion gap: 8 (ref 5–15)
BUN: 31 mg/dL — ABNORMAL HIGH (ref 6–20)
CO2: 23 mmol/L (ref 22–32)
Calcium: 8.7 mg/dL — ABNORMAL LOW (ref 8.9–10.3)
Chloride: 100 mmol/L (ref 98–111)
Creatinine, Ser: 0.95 mg/dL (ref 0.44–1.00)
GFR, Estimated: >60 mL/min (ref >60)
Glucose, Bld: 130 mg/dL — ABNORMAL HIGH (ref 70–99)
Potassium: 4 mmol/L (ref 3.5–5.1)
Sodium: 131 mmol/L — ABNORMAL LOW (ref 135–145)

## 2021-09-06 LAB — URINE CULTURE: Culture: 50000 — AB

## 2021-09-06 LAB — GLUCOSE, CAPILLARY
Glucose-Capillary: 105 mg/dL — ABNORMAL HIGH (ref 70–99)
Glucose-Capillary: 133 mg/dL — ABNORMAL HIGH (ref 70–99)
Glucose-Capillary: 135 mg/dL — ABNORMAL HIGH (ref 70–99)
Glucose-Capillary: 156 mg/dL — ABNORMAL HIGH (ref 70–99)
Glucose-Capillary: 94 mg/dL (ref 70–99)
Glucose-Capillary: 99 mg/dL (ref 70–99)

## 2021-09-06 LAB — POCT I-STAT 7, (LYTES, BLD GAS, ICA,H+H)
Acid-Base Excess: 2 mmol/L (ref 0.0–2.0)
Bicarbonate: 26 mmol/L (ref 20.0–28.0)
Calcium, Ion: 1.28 mmol/L (ref 1.15–1.40)
HCT: 25 % — ABNORMAL LOW (ref 36.0–46.0)
Hemoglobin: 8.5 g/dL — ABNORMAL LOW (ref 12.0–15.0)
O2 Saturation: 99 %
Patient temperature: 97.6
Potassium: 3.8 mmol/L (ref 3.5–5.1)
Sodium: 131 mmol/L — ABNORMAL LOW (ref 135–145)
TCO2: 27 mmol/L (ref 22–32)
pCO2 arterial: 37.6 mmHg (ref 32–48)
pH, Arterial: 7.447 (ref 7.35–7.45)
pO2, Arterial: 130 mmHg — ABNORMAL HIGH (ref 83–108)

## 2021-09-06 LAB — MAGNESIUM: Magnesium: 2.4 mg/dL (ref 1.7–2.4)

## 2021-09-06 LAB — PATHOLOGIST SMEAR REVIEW

## 2021-09-06 LAB — COOXEMETRY PANEL
Carboxyhemoglobin: 0.9 % (ref 0.5–1.5)
Methemoglobin: 1 % (ref 0.0–1.5)
O2 Saturation: 67 %
Total hemoglobin: 8.7 g/dL — ABNORMAL LOW (ref 12.0–16.0)

## 2021-09-06 LAB — LACTATE DEHYDROGENASE: LDH: 473 U/L — ABNORMAL HIGH (ref 98–192)

## 2021-09-06 LAB — PHOSPHORUS: Phosphorus: 3.2 mg/dL (ref 2.5–4.6)

## 2021-09-06 LAB — PROTIME-INR
INR: 2.4 — ABNORMAL HIGH (ref 0.8–1.2)
Prothrombin Time: 25.9 seconds — ABNORMAL HIGH (ref 11.4–15.2)

## 2021-09-06 MED ORDER — ASPIRIN EC 81 MG PO TBEC: 81.0000 mg | DELAYED_RELEASE_TABLET | Freq: Every day | ORAL | Status: DC

## 2021-09-06 MED ORDER — ASPIRIN EC 325 MG PO TBEC: 325.0000 mg | DELAYED_RELEASE_TABLET | Freq: Every day | ORAL | Status: DC

## 2021-09-06 MED ORDER — ASPIRIN EC 81 MG PO TBEC
81.0000 mg | DELAYED_RELEASE_TABLET | Freq: Every day | ORAL | Status: DC
  Administered 2021-09-06 – 2021-09-20 (×15): 81 mg via ORAL
  Filled 2021-09-06 (×15): qty 1

## 2021-09-06 MED ORDER — SENNOSIDES-DOCUSATE SODIUM 8.6-50 MG PO TABS
2.0000 | ORAL_TABLET | Freq: Two times a day (BID) | ORAL | Status: DC
  Administered 2021-09-06 – 2021-09-09 (×7): 2 via ORAL
  Filled 2021-09-06 (×10): qty 2

## 2021-09-06 MED ORDER — POLYETHYLENE GLYCOL 3350 17 G PO PACK
17.0000 g | PACK | Freq: Every day | ORAL | Status: DC
  Administered 2021-09-06 – 2021-09-08 (×2): 17 g via ORAL
  Filled 2021-09-06 (×5): qty 1

## 2021-09-06 MED ORDER — POTASSIUM CHLORIDE 10 MEQ/50ML IV SOLN
10.0000 meq | INTRAVENOUS | Status: AC
  Administered 2021-09-06 (×4): 10 meq via INTRAVENOUS
  Filled 2021-09-06: qty 50

## 2021-09-06 MED ORDER — POTASSIUM CHLORIDE 10 MEQ/50ML IV SOLN
10.0000 meq | INTRAVENOUS | Status: AC
  Administered 2021-09-06 (×4): 10 meq via INTRAVENOUS
  Filled 2021-09-06 (×2): qty 50

## 2021-09-06 MED ORDER — FUROSEMIDE 10 MG/ML IJ SOLN
80.0000 mg | Freq: Once | INTRAMUSCULAR | Status: AC
  Administered 2021-09-06: 80 mg via INTRAVENOUS
  Filled 2021-09-06: qty 8

## 2021-09-06 MED ORDER — PREDNISONE 20 MG PO TABS
20.0000 mg | ORAL_TABLET | Freq: Every day | ORAL | Status: DC
  Administered 2021-09-06 – 2021-09-20 (×15): 20 mg via ORAL
  Filled 2021-09-06 (×15): qty 1

## 2021-09-06 MED ORDER — FUROSEMIDE 10 MG/ML IJ SOLN
12.0000 mg/h | INTRAVENOUS | Status: DC
  Administered 2021-09-06: 8 mg/h via INTRAVENOUS
  Administered 2021-09-07: 6 mg/h via INTRAVENOUS
  Administered 2021-09-07: 8 mg/h via INTRAVENOUS
  Administered 2021-09-08: 6 mg/h via INTRAVENOUS
  Administered 2021-09-09 – 2021-09-10 (×2): 10 mg/h via INTRAVENOUS
  Administered 2021-09-11: 12 mg/h via INTRAVENOUS
  Administered 2021-09-11: 10 mg/h via INTRAVENOUS
  Administered 2021-09-12 – 2021-09-16 (×5): 12 mg/h via INTRAVENOUS
  Filled 2021-09-06 (×14): qty 20

## 2021-09-06 MED ORDER — METOCLOPRAMIDE HCL 5 MG/ML IJ SOLN
5.0000 mg | Freq: Four times a day (QID) | INTRAMUSCULAR | Status: AC
  Administered 2021-09-06 (×4): 5 mg via INTRAVENOUS
  Filled 2021-09-06 (×4): qty 2

## 2021-09-06 MED ORDER — ALBUMIN HUMAN 25 % IV SOLN
12.5000 g | Freq: Four times a day (QID) | INTRAVENOUS | Status: AC
  Administered 2021-09-06 – 2021-09-07 (×4): 12.5 g via INTRAVENOUS
  Filled 2021-09-06 (×4): qty 50

## 2021-09-06 NOTE — Progress Notes (Signed)
LVAD Coordinator Rounding Note: ? ?Admitted 08/26/21 due to acute on chronic systolic heart failure. Receiving ongoing treatment for hyperthyroidism with Methimazole and steroids. On 08/29/21 Impella 5.5 placed.  ? ?HM III LVAD implanted on 09/03/21 by Dr Prescott Gum under destination therapy criteria due to elevated BMI.  ? ?Pt laying in bed this am. Nurse reports patient just stood at bedside and tolerated very well. Nurse reports mediastinal chest tube dumped @ 70 cc bloody drainage.  ? ?Per BS nurse the plan per Dr. [name] will be to wean Levo and Epi as tolerated and when Epi < 5, will decrease Amiodarone to 30 mg/hr. No VT overnight. Also plan for IV Lasix bolus followed by gtt.  ? ?Pt's husband observed dressing change today. See documentation below.  ? ?Vital signs: ?Temp: 97.5 ?HR: 89 ?Arterial BP: 102/62 (73) ?O2 Sat: 98% 4 L  ?Wt: 243.2>244.7>250.6  lbs ? ?LVAD interrogation:  ?Speed: 5300 ?Flow: 4.1 ?Power:  3.8 w ?PI: 3.3 ? ?Alarms: none ?Events: none ? ?Hematocrit: 25 ?Fixed speed: 5300 ?Low speed limit: 5000 ? ?Drive Line: Existing VAD dressing removed and site care performed using sterile technique. Drive line exit site cleaned with Chlora prep applicators x 2, allowed to dry, and gauze dressing with silver strip applied. Exit site not incorporated, the velour is fully implanted at exit site. 1 suture in place. No redness, drainage, tenderness, foul odor or rash noted. Drive line anchor re-applied. Daily dressing changes using daily kit per VAD coordinator or nurse champion. Next dressing change due: 09/06/21.  ? ? ?Labs:  ?LDH trend: 829>562>130 ? ?INR trend: 0.8>1.6>2.4 ? ?WBC trend: 25.1>29.5>30.8 ? ?Hgb trend: 6.5>7.5>8.0>8.8 ? ?Anticoagulation Plan: ?-INR Goal: 2.0 - 2.5 ?-ASA Dose:  81 mg daily ? ?Blood Products:  ?IntraOp: ? 4 PRBCs ? 4 FFP ? 2 Cryo ? 2 platelets ?DDAVP ? 1,400 cell saver ?Post Op: ?09/03/21: Factor 7 ? 1 PRBC ? 1 Platelet ?09/04/21: 1 PRBC ?09/05/21: 1 PRBC ? ?Device: ?-Medtronic single  ICD ?-Therapies: OFF ? 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 ?Respiratory: extubated 09/04/21- on 4L  Nitric 4 ppm.  ? ?Infection: ?08/31/21>> blood cultures>> negative final ?09/04/21>> blood cultures>> pending ?09/04/21>> sputum culture>> moderate WBC, no organisms seen; final pending ?09/04/21>>urine culture>>50,000+ gm pos cocci ? ?Drips: ?Epinephrine 7 mcg/min ?Levophed 6 mcg/min ?Milrinone 0.25 mcg/kg/min ?Amiodarone 60 mg/hr ? ? ?Patient Education: ?Demonstrated and verbalized drive line dressing change for patient and husband. Discussed importance of sterile technique, and sterile gloves/cap.  ?Provided husband with copy of [facility] Patient Handbook and [facility] Patient Education DVD. Asked him to read handbook and watch DVD prior to discharge teaching. Pt verbalized agreement to same.  ? ?Plan/Recommendations:  ?1. Page VAD coordinators with VAD equipment and drive line concerns. ?2. Daily drive line dressing change per VAD coordinator or nurse champion using daily kit ? ?Zada Girt RN ?VAD Coordinator  ?Office: [phone]  ?24/7 Pager: [phone]  ? ?  ?

## 2021-09-06 NOTE — Progress Notes (Signed)
Occupational Therapy Re-evaluation ?Patient Details ?Name: [name] ?MRN: [id number] ?DOB: [birth year] ?Today's Date: 09/06/2021 ? ? ?History of present illness 51yo female adm 3/6 with acute on chronic heart failure exacerbation. Impella placed  3/9. LVAD HM 3 implanted 09/03/21. 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 ?  ?OT comments ? Pt with s/p LVAD placement on 3/14. Pt presenting with decreased balance, strength, functional use of BUE, and activity tolerance. Despite fatigue (due to recent surgery), pt very motivated and agreeable to therapy. Pt performing bed mobility with Mod-Max A +2. Pt performing sit<>stand x2 with Mod A +2 and taking side steps to [facility] with bil knee blocking. Pt will require further acute OT to facilitate safe dc. Recommend dc to AIR for further OT to optimize safety, independence with ADLs, and return to PLOF.   ? ?Recommendations for follow up therapy are one component of a multi-disciplinary discharge planning process, led by the attending physician.  Recommendations may be updated based on patient status, additional functional criteria and insurance authorization. ?   ?Follow Up Recommendations ? Acute inpatient rehab (3hours/day)  ?  ?Assistance Recommended at Discharge Frequent or constant Supervision/Assistance  ?Patient can return home with the following ? A lot of help with walking and/or transfers;A lot of help with bathing/dressing/bathroom ?  ?Equipment Recommendations ? Other (comment) (TBD)  ?  ?Recommendations for Other Services   ? ?  ?Precautions / Restrictions Precautions ?Precautions: Fall;Other (comment);Sternal ?Precaution Booklet Issued: No ?Precaution Comments: LVAD, 2 chest tubes, edema ?Restrictions ?Weight Bearing Restrictions: No ?Other Position/Activity Restrictions: sternal precautions  ? ? ?  ? ?Mobility Bed Mobility ?Overal bed mobility: Needs Assistance ?Bed Mobility: Supine to Sit, Sit to Supine ?  ?  ?Supine to sit: Mod assist,  HOB elevated, +2 for physical assistance ?Sit to supine: Max assist, +2 for physical assistance ?  ?General bed mobility comments: Mod A for managing BLEs and then elevating trunk. Max A +2 for returning to supine ?  ? ?Transfers ?Overall transfer level: Needs assistance ?  ?Transfers: Sit to/from Stand ?Sit to Stand: Mod assist, +2 physical assistance ?  ?  ?  ?  ?General transfer comment: Attempt sit<>stand with stedy but it width is too narrow. Performing sit<>stand with Mod A +2 twice and then taking side steps towards [facility] with Max A. ?  ?  ?Balance Overall balance assessment: Needs assistance ?Sitting-balance support: Feet supported, No upper extremity supported ?Sitting balance-Leahy Scale: Good ?  ?  ?Standing balance support: Bilateral upper extremity supported ?Standing balance-Leahy Scale: Poor ?  ?  ?  ?  ?  ?  ?  ?  ?  ?  ?  ?  ?   ? ?ADL either performed or assessed with clinical judgement  ? ?ADL Overall ADL's : Needs assistance/impaired ?Eating/Feeding: Set up;Bed level ?  ?Grooming: Sitting;Min guard ?  ?Upper Body Bathing: Moderate assistance;Sitting ?  ?Lower Body Bathing: Maximal assistance;Sit to/from stand ?  ?Upper Body Dressing : Moderate assistance;Sitting ?  ?Lower Body Dressing: Maximal assistance;Bed level ?Lower Body Dressing Details (indicate cue type and reason): donning socks ?  ?  ?  ?  ?  ?  ?Functional mobility during ADLs: Moderate assistance;+2 for physical assistance (sit<>stand only) ?General ADL Comments: Pt engaged and motivated despite fatigue and pain. Performing sit<>stand at EOB with Mod A +2. Difficult taking steps towards HOB due to buckling of BLEs ?  ? ?Extremity/Trunk Assessment Upper Extremity Assessment ?Upper Extremity Assessment:  RUE deficits/detail;LUE deficits/detail ?RUE Deficits / Details: Increased edema and strength. Difficulty performing finger opposition. ?RUE Sensation: WNL ?RUE Coordination: decreased gross motor ?LUE Deficits / Details: Increased edema  and strength. Able to perform finger opposition and hold objects/utensils ?  ?Lower Extremity Assessment ?Lower Extremity Assessment: Defer to PT evaluation ?  ?  ?  ? ?Vision   ?Vision Assessment?: No apparent visual deficits ?  ?Perception   ?  ?Praxis   ?  ? ?Cognition Arousal/Alertness: Awake/alert ?Behavior During Therapy: [facility] for tasks assessed/performed ?Overall Cognitive Status: Impaired/Different from baseline ?Area of Impairment: Memory, Problem solving ?  ?  ?  ?  ?  ?  ?  ?  ?  ?  ?Memory: Decreased recall of precautions ?  ?  ?  ?Problem Solving: Slow processing ?General Comments: Requiring increased time. Followign simple commands and very engaged in session ?  ?  ?   ?Exercises Exercises: General Lower Extremity, Other exercises ?General Exercises - Lower Extremity ?Long Arc Quad: AROM, Both, 10 reps, Seated ?Other Exercises ?Other Exercises: forward punches; seated; x10; RUE ?Other Exercises: PROM LUE; seated; LUE ? ?  ?Shoulder Instructions   ? ? ?  ?General Comments VSS on 1L  ? ? ?Pertinent Vitals/ Pain       Pain Assessment ?Pain Assessment: Faces ?Faces Pain Scale: Hurts little more ?Pain Location: bil LE and chest ?Pain Descriptors / Indicators: Aching, Guarding ?Pain Intervention(s): Monitored during session, Limited activity within patient's tolerance, Repositioned ? ?Home Living   ?  ?  ?  ?  ?  ?  ?  ?  ?  ?  ?  ?  ?  ?  ?  ?  ?  ?  ? ?  ?Prior Functioning/Environment    ?  ?  ?  ?   ? ?Frequency ? Min 2X/week  ? ? ? ? ?  ?Progress Toward Goals ? ?OT Goals(current goals can now be found in the care plan section) ? Progress towards OT goals: Progressing toward goals ? ?Acute Rehab OT Goals ?OT Goal Formulation: With patient ?Time For Goal Achievement: 09/14/21 ?Potential to Achieve Goals: Good ?ADL Goals ?Pt Will Perform Grooming: with modified independence;sitting ?Pt Will Perform Upper Body Bathing: with set-up;sitting ?Pt Will Perform Lower Body Dressing: with min assist;sit to/from  stand;sitting/lateral leans ?Pt Will Transfer to Toilet: with min assist;stand pivot transfer;bedside commode ?Pt/caregiver will Perform Home Exercise Program: Increased strength;Increased ROM;Right Upper extremity;With written HEP provided  ?Plan Discharge plan remains appropriate   ? ?Co-evaluation ? ? ?   ?  ?  ?  ?  ? ?  ?AM-PAC OT "6 Clicks" Daily Activity     ?Outcome Measure ? ? Help from another person eating meals?: A Little ?Help from another person taking care of personal grooming?: A Little ?Help from another person toileting, which includes using toliet, bedpan, or urinal?: A Lot ?Help from another person bathing (including washing, rinsing, drying)?: A Lot ?Help from another person to put on and taking off regular upper body clothing?: A Little ?Help from another person to put on and taking off regular lower body clothing?: A Lot ?6 Click Score: 15 ? ?  ?End of Session Equipment Utilized During Treatment: Oxygen ? ?OT Visit Diagnosis: Pain;Other abnormalities of gait and mobility (R26.89);Muscle weakness (generalized) (M62.81) ?Pain - Right/Left: Right ?Pain - part of body: Shoulder;Arm ?  ?Activity Tolerance Patient tolerated treatment well ?  ?Patient Left in bed;with call [place]/phone within reach;with nursing/sitter in room ?  ?  Nurse Communication Mobility status ?  ? ?   ? ?Time: 9326-7124 ?OT Time Calculation (min): 30 min ? ?Charges: OT General Charges ?$OT Visit: 1 Visit ?OT Evaluation ?$OT Re-eval: 1 Re-eval ?OT Treatments ?$Self Care/Home Management : 8-22 mins ? ?Rileyann Florance MSOT, OTR/L ?Acute Rehab ?Pager: [phone] ?Office: [phone] ? ?Kazi Montoro M Kaison Mcparland ?09/06/2021, 3:35 PM ? ? ?

## 2021-09-06 NOTE — Progress Notes (Signed)
Physical Therapy Treatment- REeval ?Patient Details ?Name: [name] ?MRN: [id number] ?DOB: 1970-09-23 ?Today's Date: 09/06/2021 ? ? ?History of Present Illness 51yo female adm 3/6 with acute on chronic heart failure exacerbation. Impella placed  3/9. LVAD HM 3 implanted 09/03/21. 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 ? ?  ?PT Comments  ? ? Pt reports bil LE pain due to edema making standing and mobility difficult. Pt also with edema RUE making eating and tasks difficult with pain and weakness. Pt provided initial education for sternal precautions, LVAD equipment and plan. Pt with decreased function and mobility from initial eval given prolong hospitalization and LVAD placement. Goals and D/C plan updated to reflect current status. Pt with decreased strength, function, mobility and awareness of precautions who will benefit from acute therapy to maximize mobility and safety. Pt educated for bil LE HEp and encouraged to perform in chair position today.  ? ?HR 89 ?Sats 100% on 4L with inconsitent pleth ?Speed 5300, flow 4.2, PI 3.5, power 3.7 ?  ?Recommendations for follow up therapy are one component of a multi-disciplinary discharge planning process, led by the attending physician.  Recommendations may be updated based on patient status, additional functional criteria and insurance authorization. ? ?Follow Up Recommendations ? Acute inpatient rehab (3hours/day) ?  ?  ?Assistance Recommended at Discharge Frequent or constant Supervision/Assistance  ?Patient can return home with the following A lot of help with walking and/or transfers;A lot of help with bathing/dressing/bathroom;Assistance with cooking/housework;Direct supervision/assist for financial management ?  ?Equipment Recommendations ? Rollator (4 wheels);BSC/3in1  ?  ?Recommendations for Other Services   ? ? ?  ?Precautions / Restrictions Precautions ?Precautions: Fall;Other (comment);Sternal ?Precaution Booklet Issued:  No ?Precaution Comments: LVAD, 2 chest tubes, edema ?Restrictions ?Weight Bearing Restrictions: No  ?  ? ?Mobility ? Bed Mobility ?Overal bed mobility: Needs Assistance ?Bed Mobility: Supine to Sit, Sit to Supine ?  ?  ?Supine to sit: Mod assist, HOB elevated, +2 for physical assistance ?Sit to supine: Max assist, +2 for physical assistance ?  ?General bed mobility comments: mod assist for bil LE movement to pivot to left side of bed with HOB 35 degrees, increased time and cues for precautions and safety with use of pad. Max +2 to return to bed and position. Placed in chair position in bed end of session ?  ? ?Transfers ?Overall transfer level: Needs assistance ?  ?Transfers: Sit to/from Stand ?Sit to Stand: Mod assist, +2 physical assistance ?  ?  ?  ?  ?  ?General transfer comment: from elevated bed mod +2 assist with pad cradling sacrum to stand with left knee blocked. Pt stood x 2 and able to take small steps toward [facility] on 2nd trials with partial left knee buckling and need for maintained block to control balance. deferred transfer chair due to pain and fatigue ?  ? ?Ambulation/Gait ?  ?  ?  ?  ?  ?  ?  ?General Gait Details: not yet able ? ? ?Stairs ?  ?  ?  ?  ?  ? ? ?Wheelchair Mobility ?  ? ?Modified Rankin (Stroke Patients Only) ?  ? ? ?  ?Balance Overall balance assessment: Needs assistance ?  ?Sitting balance-Leahy Scale: Good ?Sitting balance - Comments: EOB without UE support ?  ?Standing balance support: Bilateral upper extremity supported ?Standing balance-Leahy Scale: Poor ?Standing balance comment: pad at sacrum and UE support ?  ?  ?  ?  ?  ?  ?  ?  ?  ?  ?  ?  ? ?  ?  Cognition Arousal/Alertness: Awake/alert ?Behavior During Therapy: [facility] for tasks assessed/performed ?Overall Cognitive Status: Impaired/Different from baseline ?Area of Impairment: Memory ?  ?  ?  ?  ?  ?  ?  ?  ?  ?  ?Memory: Decreased recall of precautions ?  ?  ?  ?  ?General Comments: pt educated for sternal precautions, LVAD  equipment and able to recall 2 precautions end of session ?  ?  ? ?  ?Exercises General Exercises - Lower Extremity ?Short Arc Quad: AAROM, Both, Seated, 10 reps ?Hip Flexion/Marching: AAROM, Both, Seated, 10 reps ? ?  ?General Comments   ?  ?  ? ?Pertinent Vitals/Pain Pain Assessment ?Faces Pain Scale: Hurts little more ?Pain Location: bil LE and chest ?Pain Descriptors / Indicators: Aching, Guarding ?Pain Intervention(s): Limited activity within patient's tolerance, Monitored during session, Premedicated before session, Repositioned  ? ? ?Home Living   ?  ?  ?  ?  ?  ?  ?  ?  ?  ?   ?  ?Prior Function    ?  ?  ?   ? ?PT Goals (current goals can now be found in the care plan section) Acute Rehab PT Goals ?Patient Stated Goal: return to walking and reading books ?PT Goal Formulation: With patient/family ?Time For Goal Achievement: 09/20/21 ?Potential to Achieve Goals: Good ?Progress towards PT goals: Goals downgraded-see care plan ? ?  ?Frequency ? ? ? Min 4X/week ? ? ? ?  ?PT Plan Current plan remains appropriate  ? ? ?Co-evaluation   ?  ?  ?  ?  ? ?  ?AM-PAC PT "6 Clicks" Mobility   ?Outcome Measure ? Help needed turning from your back to your side while in a flat bed without using bedrails?: A Lot ?Help needed moving from lying on your back to sitting on the side of a flat bed without using bedrails?: A Lot ?Help needed moving to and from a bed to a chair (including a wheelchair)?: Total ?Help needed standing up from a chair using your arms (e.g., wheelchair or bedside chair)?: Total ?Help needed to walk in hospital room?: Total ?Help needed climbing 3-5 steps with a railing? : Total ?6 Click Score: 8 ? ?  ?End of Session Equipment Utilized During Treatment: Oxygen ?Activity Tolerance: Patient tolerated treatment well ?Patient left: in bed;with call [place]/phone within reach;with family/visitor present;with nursing/sitter in room ?Nurse Communication: Mobility status;Precautions ?PT Visit Diagnosis: Other  abnormalities of gait and mobility (R26.89);Muscle weakness (generalized) (M62.81) ?  ? ? ?Time: 0102-7253 ?PT Time Calculation (min) (ACUTE ONLY): 41 min ? ?Charges:  $Therapeutic Activity: 23-37 mins   ?1 reeval        ?          ? ?Racquel Arkin P, PT ?Acute Rehabilitation Services ?Pager: [phone] ?Office: [phone] ? ? ? ?Siegfried Vieth B Rini Moffit ?09/06/2021, 11:43 AM ? ?

## 2021-09-06 NOTE — Progress Notes (Signed)
HeartMate 3 Rounding Note ? ?Postop day  #3 HeartMate 3 implantation March 14 ? ?Subjective:   ? ?No more ventricular arrhythmias through the night ?Patient complains of feeling more swollen, weight up slightly from yesterday ?Blood pressure is improved and epinephrine and norepinephrine are being slowly weaned ?Chest x-ray remains clear with minimal edema ?Chest tube output tapering down, will probably remove 2 tubes tomorrow ?Patient overall feels weaker today with poor appetite ? ? ?LVAD INTERROGATION:  ?HeartMate 3LVAD:  Flow 4.2 liters/min, speed 5300, power 4.1, PI 3.5.  Controller  intact.  ? ?Objective:   ? ?Vital Signs:   ?Temp:  [97.3 ?F (36.3 ?C)-99.3 ?F (37.4 ?C)] 97.3 ?F (36.3 ?C) (03/17 1000) ?Pulse Rate:  [82-164] 87 (03/17 1000) ?Resp:  [19-36] 24 (03/17 1000) ?SpO2:  [86 %-100 %] 99 % (03/17 1000) ?Arterial Line BP: (79-113)/(58-79) 113/79 (03/17 1000) ?Weight:  [113.7 kg] 113.7 kg (03/17 0600) ?Last BM Date : 09/01/21 ?Mean arterial Pressure 70 ? ?Intake/Output:  ? ?Intake/Output Summary (Last 24 hours) at 09/06/2021 1107 ?Last data filed at 09/06/2021 1000 ?Gross per 24 hour  ?Intake 3442.04 ml  ?Output 1509 ml  ?Net 1933.04 ml  ?  ? ?Physical Exam: ?General:  Well appearing. No resp difficulty, mild tachypnea ?HEENT: normal ?Neck: supple. JVP . Carotids 2+ bilat; no bruits. No lymphadenopathy or thryomegaly appreciated. L neck drain at Impella 5.5 site  minimal output ?Cor: Mechanical heart sounds with LVAD hum present. ?Lungs: scattered rales ?Abdomen: soft, nontender, nondistended. No hepatosplenomegaly. No bruits or masses. Good bowel sounds. ?Extremities: no cyanosis, clubbing, rash, edema ?Neuro: alert & orientedx3, cranial nerves grossly intact. moves all 4 extremities w/o difficulty. Affect pleasant ? ?Telemetry: sinus tach ? ?Labs: ?Basic Metabolic Panel: ?Recent Labs  ?Lab 09/01/21 ?1637 09/02/21 ?0311 09/02/21 ?2703 09/03/21 ?2059 09/04/21 ?0311 09/04/21 ?0403 09/04/21 ?1553  09/04/21 ?1556 09/05/21 ?0400 09/05/21 ?1535 09/05/21 ?1537 09/06/21 ?0355 09/06/21 ?0415  ?NA  --  135   < > 135 136   < > 135   < > 133* 131* 132* 130* 131*  ?K  --  3.7   < > 4.1 4.3   < > 3.8   < > 3.7 3.6 3.7 3.8 3.8  ?CL  --  94*   < > 100 100  --  98  --  98 96*  --  99  --   ?CO2  --  33*   < > 24 26  --  24  --  24 23  --  24  --   ?GLUCOSE  --  101*   < > 160* 119*  --  120*  --  121* 148*  --  105*  --   ?BUN  --  25*   < > 22* 25*  --  27*  --  27* 29*  --  30*  --   ?CREATININE  --  0.91   < > 1.04* 1.13*  --  1.19*  --  1.17* 1.06*  --  0.94  --   ?CALCIUM  --  9.1   < > 8.9 8.5*  --  8.4*  --  8.6* 8.3*  --  8.6*  --   ?MG 2.3 2.3   < > 2.9* 2.5*  --  2.3  --  2.2  --   --  2.4  --   ?PHOS 4.3 4.6  --   --  6.1*  --   --   --  4.1  --   --  3.2  --   ? < > = values in this interval not displayed.  ? ? ?Liver Function Tests: ?Recent Labs  ?Lab 09/01/21 ?0331 09/04/21 ?0311 09/05/21 ?0400 09/06/21 ?0355  ?AST 76* 92* 85* 53*  ?ALT 65* '31 27 25  '$ ?ALKPHOS 104 37* 44 60  ?BILITOT 1.0 1.5* 2.1* 1.6*  ?PROT 5.7* 4.4* 4.8* 5.0*  ?ALBUMIN 2.7* 3.0* 2.8* 2.5*  ? ?No results for input(s): LIPASE, AMYLASE in the last 168 hours. ?No results for input(s): AMMONIA in the last 168 hours. ? ?CBC: ?Recent Labs  ?Lab 09/04/21 ?1553 09/04/21 ?1556 09/04/21 ?2238 09/05/21 ?0400 09/05/21 ?1535 09/05/21 ?1537 09/06/21 ?0355 09/06/21 ?0415  ?WBC 20.9*  --  24.8* 29.5* 30.5*  --  30.8*  --   ?NEUTROABS 13.9*  --  17.0* 20.8* 23.4*  --  23.8*  --   ?HGB 8.3*   < > 7.8* 8.0* 8.9* 9.2* 8.8* 8.5*  ?HCT 25.3*   < > 23.3* 25.0* 27.2* 27.0* 26.3* 25.0*  ?MCV 85.2  --  86.6 88.3 87.7  --  87.7  --   ?PLT 110*  --  99* 113* 98*  --  106*  --   ? < > = values in this interval not displayed.  ? ? ?INR: ?Recent Labs  ?Lab 09/01/21 ?0331 09/03/21 ?1611 09/04/21 ?0311 09/05/21 ?0400 09/06/21 ?0355  ?INR 1.2 1.5* 0.8 1.6* 2.4*  ? ? ?Other results: ?EKG:  ? ?Imaging: ?DG Chest Port 1 View ? ?Result Date: 09/06/2021 ?CLINICAL DATA:  LVAD EXAM:  PORTABLE CHEST 1 VIEW COMPARISON:  September 05, 2021 FINDINGS: Left chest AICD/pacemaker with leads in unchanged position. Stable positioning of the left ventricular cyst device. Removal of the right IJ approach Swan-Ganz catheter. Interval placement of a right upper extremity PICC with tip overlying the right atrium. Defibrillator leads overlie the chest/upper abdomen. Enlarged cardiac silhouette is unchanged from prior. Low lung volumes with bibasilar atelectasis. No visible pleural effusion or pneumothorax. Prior median sternotomy. Gaseous lead distended loops of bowel in the left upper abdomen. IMPRESSION: 1. Interval placement of a right upper extremity PICC with tip overlying the right atrium. 2. Low lung volumes with bibasilar atelectasis. Electronically Signed   By: Dahlia Bailiff M.D.   On: 09/06/2021 08:15  ? ?DG Chest Port 1 View ? ?Result Date: 09/05/2021 ?CLINICAL DATA:  51 year old female with LVAD. EXAM: PORTABLE CHEST - 1 VIEW COMPARISON:  09/04/2021 FINDINGS: The mediastinal contours are unchanged. Unchanged cardiomegaly. Similar appearance of right IJ central venous catheter with indwelling pulmonary artery catheter, tip in a right lower lobar branch. Dual lead AICD in place with generator left chest wall with leads in unchanged position via subclavian approach. Bilateral thoracostomy tubes in unchanged position. LVAD in place, unchanged. Interval extubation. The lungs are clear bilaterally without evidence of focal consolidation, pleural effusion, or pneumothorax. No acute osseous abnormality. Median sternotomy wires in place. IMPRESSION: 1. Unchanged cardiomegaly with LVAD in place. 2. The lungs remain clear after interval extubation. 3. Remaining support devices and tubes in similar positions. Electronically Signed   By: Ruthann Cancer M.D.   On: 09/05/2021 08:21  ? ?[name] EKG SITE RITE ? ?Result Date: 09/05/2021 ?If [employer] not attached, placement could not be confirmed due to current cardiac  rhythm.  ? ? ?Medications:   ? ? ?Scheduled Medications: ? (feeding supplement) PROSource Plus  60 mL Oral TID BM  ? sodium chloride   Intravenous Once  ? sodium chloride   Intravenous Once  ?  acetaminophen  1,000 mg Oral Q6H  ? Or  ? acetaminophen (TYLENOL) oral liquid 160 mg/5 mL  1,000 mg Oral Q6H  ? aspirin EC  81 mg Oral Daily  ? bisacodyl  10 mg Oral Daily  ? Chlorhexidine Gluconate Cloth  6 each Topical Daily  ? Chlorhexidine Gluconate Cloth  6 each Topical Daily  ? insulin aspart  0-24 Units Subcutaneous Q4H  ? methimazole  10 mg Oral Daily  ? methimazole  5 mg Oral QPM  ? metoCLOPramide (REGLAN) injection  5 mg Intravenous Q6H  ? pantoprazole  40 mg Oral Daily  ? polyethylene glycol  17 g Oral Daily  ? predniSONE  20 mg Oral Q breakfast  ? quiniDINE gluconate  324 mg Oral BID  ? rosuvastatin  40 mg Oral QHS  ? senna-docusate  2 tablet Oral BID  ? sodium chloride flush  10-40 mL Intracatheter Q12H  ? sodium chloride flush  10-40 mL Intracatheter Q12H  ? sodium chloride flush  3 mL Intravenous Q12H  ? Warfarin - Physician Dosing Inpatient   Does not apply q1600  ? ? ?Infusions: ? sodium chloride Stopped (09/05/21 1205)  ? sodium chloride    ? sodium chloride Stopped (09/06/21 0914)  ? sodium chloride    ? albumin human 12.5 g (09/06/21 1039)  ? amiodarone 60 mg/hr (09/06/21 1000)  ? ceFEPime (MAXIPIME) IV Stopped (09/06/21 6734)  ? epinephrine 6 mcg/min (09/06/21 1000)  ? furosemide (LASIX) 200 mg in dextrose 5% 100 mL ('2mg'$ /mL) infusion 8 mg/hr (09/06/21 1028)  ? lactated ringers    ? lactated ringers Stopped (09/05/21 1010)  ? milrinone 0.25 mcg/kg/min (09/06/21 1000)  ? norepinephrine (LEVOPHED) Adult infusion 5 mcg/min (09/06/21 1000)  ? potassium chloride 10 mEq (09/06/21 1037)  ? potassium chloride    ? ? ?PRN Medications: ?sodium chloride, Place/Maintain arterial line **AND** sodium chloride, midazolam, morphine injection, ondansetron (ZOFRAN) IV, oxyCODONE, sodium chloride flush, sodium chloride flush,  sodium chloride flush, traMADol ? ? ?Assessment:  ?History of nonischemic cardiomyopathy with recent readmission for worsening heart failure and cardiogenic shock ? ?Preoperative bridge with Impella 5.5 LVAD to st

## 2021-09-06 NOTE — Progress Notes (Signed)
CSW met at bedside with patient and husband. Patient shared she stood up twice and was feeling wiped out. Patient's husband has been here since yesterday and states he plans to go home and get some rest. Patient agreeable to husband staying home tonight as she feels supported by team. CSW provided supportive intervention and will continue to follow throughout implant hospitalization. Raquel Sarna, [place], [place] ? ?

## 2021-09-06 NOTE — Progress Notes (Addendum)
HeartMate 2 Rounding Note ? ?Subjective:   ? ?- 3/14: HM3 LVAD placement with removal of Impella 5.5  ?- 3/15: Speed increased to 5300 rpm. Given 1UPRBC. Febrile. Cultures obtained. Extubated.  ?- 3/16 VT paced out. On amio drip. Given 1UPRBCs . Given couple doses of IV lasix.  ? ?Today, she is on epinephrine 7, milrinone 0.25, NE 6, amio 60  ? ?CO-OX 67%.  ? ?Hgb 8 > 8.8  ? ?Hungry. Says she wants the fluid off. Complaining of hand edema.  ? ?LVAD INTERROGATION:  ?HeartMate II LVAD:  Flow 4.1  liters/min, speed 5300, power 4, PI 3.3.  Few PI events overnight.  ?LDH 473  ?Objective:   ? ?Vital Signs:   ?Temp:  [97.6 ?F (36.4 ?C)-99.5 ?F (37.5 ?C)] 97.6 ?F (36.4 ?C) (03/17 0400) ?Pulse Rate:  [82-164] 86 (03/17 0600) ?Resp:  [19-43] 21 (03/17 0600) ?BP: (53-88)/(26-77) 61/41 (03/16 0945) ?SpO2:  [86 %-100 %] 100 % (03/17 0600) ?Arterial Line BP: (79-107)/(58-79) 105/65 (03/17 0600) ?Weight:  [113.7 kg] 113.7 kg (03/17 0600) ?Last BM Date : 09/01/21 ?Mean arterial Pressure 70s  ? ?Intake/Output:  ? ?Intake/Output Summary (Last 24 hours) at 09/06/2021 0711 ?Last data filed at 09/06/2021 0500 ?Gross per 24 hour  ?Intake 3095.6 ml  ?Output 2310 ml  ?Net 785.6 ml  ?  ? ?CVP 6-7  ?Physical Exam: ?GENERAL: No acute distress. ?HEENT: normal  ?NECK: Supple, JVP 7-8  .  2+ bilaterally, no bruits.  No lymphadenopathy or thyromegaly appreciated.   ?CARDIAC:  Mechanical heart sounds with LVAD hum present. JP drain ?LUNGS:  Clear to auscultation bilaterally. MT tubes ?ABDOMEN:  Soft, round, nontender, positive bowel sounds x4.     ?LVAD exit site:  Dressing dry and intact.  No erythema or drainage.  Stabilization device present and accurately applied.  Driveline dressing is being changed daily per sterile technique. ?EXTREMITIES:  Warm and dry, no cyanosis, clubbing, rash or edema  R rad aline R PICC  ?NEUROLOGIC:  Alert and oriented x 3.    No aphasia.  No dysarthria.  Affect pleasant.    ?GU: foley ? ?Telemetry: SR 80s   ? ? ? ?Labs: ?Basic Metabolic Panel: ?Recent Labs  ?Lab 09/01/21 ?1637 09/02/21 ?0311 09/02/21 ?4970 09/03/21 ?2059 09/04/21 ?0311 09/04/21 ?0403 09/04/21 ?1553 09/04/21 ?1556 09/05/21 ?0400 09/05/21 ?1535 09/05/21 ?1537 09/06/21 ?0355 09/06/21 ?0415  ?NA  --  135   < > 135 136   < > 135   < > 133* 131* 132* 130* 131*  ?K  --  3.7   < > 4.1 4.3   < > 3.8   < > 3.7 3.6 3.7 3.8 3.8  ?CL  --  94*   < > 100 100  --  98  --  98 96*  --  99  --   ?CO2  --  33*   < > 24 26  --  24  --  24 23  --  24  --   ?GLUCOSE  --  101*   < > 160* 119*  --  120*  --  121* 148*  --  105*  --   ?BUN  --  25*   < > 22* 25*  --  27*  --  27* 29*  --  30*  --   ?CREATININE  --  0.91   < > 1.04* 1.13*  --  1.19*  --  1.17* 1.06*  --  0.94  --   ?CALCIUM  --  9.1   < > 8.9 8.5*  --  8.4*  --  8.6* 8.3*  --  8.6*  --   ?MG 2.3 2.3   < > 2.9* 2.5*  --  2.3  --  2.2  --   --  2.4  --   ?PHOS 4.3 4.6  --   --  6.1*  --   --   --  4.1  --   --  3.2  --   ? < > = values in this interval not displayed.  ? ? ?Liver Function Tests: ?Recent Labs  ?Lab 09/01/21 ?0331 09/04/21 ?0311 09/05/21 ?0400 09/06/21 ?0355  ?AST 76* 92* 85* 53*  ?ALT 65* '31 27 25  '$ ?ALKPHOS 104 37* 44 60  ?BILITOT 1.0 1.5* 2.1* 1.6*  ?PROT 5.7* 4.4* 4.8* 5.0*  ?ALBUMIN 2.7* 3.0* 2.8* 2.5*  ? ?No results for input(s): LIPASE, AMYLASE in the last 168 hours. ?No results for input(s): AMMONIA in the last 168 hours. ? ?CBC: ?Recent Labs  ?Lab 09/04/21 ?1553 09/04/21 ?1556 09/04/21 ?2238 09/05/21 ?0400 09/05/21 ?1535 09/05/21 ?1537 09/06/21 ?0355 09/06/21 ?0415  ?WBC 20.9*  --  24.8* 29.5* 30.5*  --  30.8*  --   ?NEUTROABS 13.9*  --  17.0* 20.8* 23.4*  --  23.8*  --   ?HGB 8.3*   < > 7.8* 8.0* 8.9* 9.2* 8.8* 8.5*  ?HCT 25.3*   < > 23.3* 25.0* 27.2* 27.0* 26.3* 25.0*  ?MCV 85.2  --  86.6 88.3 87.7  --  87.7  --   ?PLT 110*  --  99* 113* 98*  --  106*  --   ? < > = values in this interval not displayed.  ? ? ?INR: ?Recent Labs  ?Lab 09/01/21 ?0331 09/03/21 ?1611 09/04/21 ?0311  09/05/21 ?0400 09/06/21 ?0355  ?INR 1.2 1.5* 0.8 1.6* 2.4*  ? ? ?Other results: ?EKG:  ? ?Imaging: ?DG Chest Port 1 View ? ?Result Date: 09/05/2021 ?CLINICAL DATA:  51 year old female with LVAD. EXAM: PORTABLE CHEST - 1 VIEW COMPARISON:  09/04/2021 FINDINGS: The mediastinal contours are unchanged. Unchanged cardiomegaly. Similar appearance of right IJ central venous catheter with indwelling pulmonary artery catheter, tip in a right lower lobar branch. Dual lead AICD in place with generator left chest wall with leads in unchanged position via subclavian approach. Bilateral thoracostomy tubes in unchanged position. LVAD in place, unchanged. Interval extubation. The lungs are clear bilaterally without evidence of focal consolidation, pleural effusion, or pneumothorax. No acute osseous abnormality. Median sternotomy wires in place. IMPRESSION: 1. Unchanged cardiomegaly with LVAD in place. 2. The lungs remain clear after interval extubation. 3. Remaining support devices and tubes in similar positions. Electronically Signed   By: Ruthann Cancer M.D.   On: 09/05/2021 08:21  ? ?[name] EKG SITE RITE ? ?Result Date: 09/05/2021 ?If [employer] not attached, placement could not be confirmed due to current cardiac rhythm.  ? ? ?Medications:   ? ? ?Scheduled Medications: ? (feeding supplement) PROSource Plus  60 mL Oral TID BM  ? sodium chloride   Intravenous Once  ? sodium chloride   Intravenous Once  ? acetaminophen  1,000 mg Oral Q6H  ? Or  ? acetaminophen (TYLENOL) oral liquid 160 mg/5 mL  1,000 mg Oral Q6H  ? aspirin EC  325 mg Oral Daily  ? bisacodyl  10 mg Oral Daily  ? Chlorhexidine Gluconate Cloth  6 each Topical Daily  ? Chlorhexidine Gluconate Cloth  6 each Topical Daily  ?  docusate sodium  100 mg Oral Daily  ? feeding supplement (PROSource TF)  45 mL Per Tube Daily  ? furosemide  40 mg Intravenous BID  ? insulin aspart  0-24 Units Subcutaneous Q4H  ? methimazole  10 mg Oral Daily  ? methimazole  5 mg Oral QPM  ?  methylPREDNISolone (SOLU-MEDROL) injection  20 mg Intravenous Daily  ? pantoprazole  40 mg Oral Daily  ? quiniDINE gluconate  324 mg Oral BID  ? rosuvastatin  40 mg Oral QHS  ? sodium chloride flush  10-40 mL Intracatheter Q12H  ? sodium chloride flush  10-40 mL Intracatheter Q12H  ? sodium chloride flush  3 mL Intravenous Q12H  ? Warfarin - Physician Dosing Inpatient   Does not apply q1600  ? ? ?Infusions: ? sodium chloride Stopped (09/05/21 1205)  ? sodium chloride    ? sodium chloride Stopped (09/06/21 0417)  ? sodium chloride    ? amiodarone 60 mg/hr (09/06/21 0655)  ? ceFEPime (MAXIPIME) IV 2 g (09/06/21 0551)  ? epinephrine 7 mcg/min (09/06/21 0500)  ? lactated ringers    ? lactated ringers Stopped (09/05/21 1010)  ? milrinone 0.25 mcg/kg/min (09/06/21 0500)  ? norepinephrine (LEVOPHED) Adult infusion 6 mcg/min (09/06/21 0500)  ? potassium chloride    ? potassium chloride    ? ? ?PRN Medications: ?sodium chloride, Place/Maintain arterial line **AND** sodium chloride, midazolam, morphine injection, ondansetron (ZOFRAN) IV, oxyCODONE, sodium chloride flush, sodium chloride flush, sodium chloride flush, traMADol ? ? ? ?Plan/Discussion:   ? ? 1. Acute on chronic systolic HF: Nonischemic cardiomyopathy.  She has a Med[employer]onic ICD. Cause for cardiomyopathy is uncertain, initially identified peri-partum (after son's birth), so cannot rule out peri-partum CMP.  On and off, she has had frequent PVCs.   RHC in 8/15 showed normal filling pressures and preserved cardiac output. Echo in 4/22 with EF 25%, severe LV dilation.  CPX (8/22) with mild-moderate HF limitation.  Echo this admission with EF < 20% with severe dilation, normal RV size with mildly decreased systolic function, moderate-severe functional MR with dilated IVC. She was admitted with volume overload, NYHA class IV symptoms and low output. HCO3 18 and creatinine 1.5 on admit, co-ox 47%. Milrinone 0.25 started + lasix gtt.  I also suspect hyperthyroidism is  playing a role in her CHF exacerbation. 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 Impella 5.5 placed 3/9.  Patient had HM3 LVAD pl

## 2021-09-06 NOTE — Progress Notes (Signed)
Nutrition Follow-up ? ?DOCUMENTATION CODES:  ? ?Obesity unspecified, Non-severe (moderate) malnutrition in context of chronic illness ? ?INTERVENTION:  ? ?Initiate Calorie Count to better assess nutritional intake over the weekend; if po intake inadequate, recommend considering Cortrak placement on Monday ? ?Liberalize diet to REGULAR-given poor appetite with poor po intake and malnutrition, hoping to optimize intake to hopefully avoid Cortrak as pt does not want a Cortrak; monitor fluid intake ? ?Resume ProSource Plus to 60 mL TID, each supplement provides 100 kcals and 15 grams protein. Total of 600 kcals, 90 g of protein. Pt to take with Lemonade per pt request ? ?Encourage smaller, more frequent meals. Husband and son to bring in snacks such as walnuts for pt.  ?  ?Continue with MVI with Minerals daily ? ? ?NUTRITION DIAGNOSIS:  ? ?Moderate Malnutrition related to chronic illness as evidenced by moderate muscle depletion, mild fat depletion, energy intake < 75% for > or equal to 1 month. ? ?Being addressed via TF  ? ?GOAL:  ? ?Patient will meet greater than or equal to 90% of their needs ? ?Progressing ? ?MONITOR:  ? ?PO intake, Supplement acceptance, Labs, Weight trends ? ?REASON FOR ASSESSMENT:  ? ?Consult ?LVAD Eval ? ?ASSESSMENT:  ? ?51 yo female admitted with acute on chronic CHF. PMH includes NICM EF 20-25%, CKD 3, CVA, HTN, HLD ? ?3/08 RHC: low cardiac output with elevated right/left filling pressure ?03/09 s/p Impella 5.5 placement ?03/10 Cortrak placed (tip in distal stomach) ?3/14 LVAD placed ?3/15 Extubated ? ?Pt exhausted after working with PT today. Pt reports she believes she will feel better once she "gets more fluid off." Reinforced the  importance of adequate nutrition with regards to post op healing  ? ?Pt ate part of an omelette for breakfast. Reports she ate some chicken and mashed potatoes on dinner. Pt ate bites of chicken salad at lunch. Pt just extubated yesterday morning post LVAD  placement and had CL liquid diet for other meals.  ? ?Pt still complains of poor appetite and early satiety. Pt cannot eat very much before she starts belching and feeling full. Encouraged small frequent meals. Encouraged snacks, husband/son to bring in walnuts and other snacks for pt to snack on.  ? ?Pt is taking her Pro-Source with Lemonade, however pt was under the impression she only needed to take 1. I explained to her the importance of taking a total of 6 per day to assist in meeting in protein needs. As previously noted, pt will not take Ensure/Boost supplements. Spoke with RN this afternoon who reports pt took 2 Pro-Source with Lemonade this AM and is working on the next 2 with Lemonade.  ? ?Chest tubes with 428 mL ? ?+constipation; noted miralax, dulcolax, reglan ordered.  ?Pt denies N/V.  ? ? ?Labs: sodium 131 (L), Creatinine wdl  ?Meds: dulcolax, lasix gtt, ss novolog, reglan, prednisone, KCl, senokot ? ? ?Diet Order:   ?Diet Order   ? ?       ?  Diet heart healthy/carb modified Room service appropriate? Yes; Fluid consistency: Thin  Diet effective now       ?  ? ?  ?  ? ?  ? ? ?EDUCATION NEEDS:  ? ?Education needs have been addressed ? ?Skin:  Skin Assessment: Reviewed RN Assessment (closed incision to chest) ? ?Last BM:  3/12 ? ?Height:  ? ?Ht Readings from Last 1 Encounters:  ?08/29/21 '5\' 3"'$  (1.6 m)  ? ? ?Weight:  ? ?Wt Readings from Last  1 Encounters:  ?09/06/21 113.7 kg  ? ? ?BMI:  Body mass index is 44.4 kg/m?. ? ?Estimated Nutritional Needs:  ? ?Kcal:  1800-2000 kcals ? ?Protein:  100-115 g ? ?Fluid:  2L ? ? ?Kerman Passey MS, RDN, LDN, CNSC ?Registered Dietitian III ?Clinical Nutrition ?RD Pager and On-Call Pager Number Located in [place]  ? ?

## 2021-09-07 ENCOUNTER — Inpatient Hospital Stay (HOSPITAL_COMMUNITY): Payer: BC Managed Care – PPO

## 2021-09-07 DIAGNOSIS — I509 Heart failure, unspecified: Secondary | ICD-10-CM | POA: Diagnosis not present

## 2021-09-07 LAB — POCT I-STAT 7, (LYTES, BLD GAS, ICA,H+H)
Acid-Base Excess: 2 mmol/L (ref 0.0–2.0)
Bicarbonate: 24.9 mmol/L (ref 20.0–28.0)
Calcium, Ion: 1.23 mmol/L (ref 1.15–1.40)
HCT: 25 % — ABNORMAL LOW (ref 36.0–46.0)
Hemoglobin: 8.5 g/dL — ABNORMAL LOW (ref 12.0–15.0)
O2 Saturation: 98 %
Potassium: 3 mmol/L — ABNORMAL LOW (ref 3.5–5.1)
Sodium: 134 mmol/L — ABNORMAL LOW (ref 135–145)
TCO2: 26 mmol/L (ref 22–32)
pCO2 arterial: 33.5 mmHg (ref 32–48)
pH, Arterial: 7.48 — ABNORMAL HIGH (ref 7.35–7.45)
pO2, Arterial: 89 mmHg (ref 83–108)

## 2021-09-07 LAB — CBC WITH DIFFERENTIAL/PLATELET
Abs Immature Granulocytes: 0.79 10*3/uL — ABNORMAL HIGH (ref 0.00–0.07)
Basophils Absolute: 0.1 10*3/uL (ref 0.0–0.1)
Basophils Relative: 0 %
Eosinophils Absolute: 0 10*3/uL (ref 0.0–0.5)
Eosinophils Relative: 0 %
HCT: 25.6 % — ABNORMAL LOW (ref 36.0–46.0)
Hemoglobin: 8.4 g/dL — ABNORMAL LOW (ref 12.0–15.0)
Immature Granulocytes: 3 %
Lymphocytes Relative: 6 %
Lymphs Abs: 1.5 10*3/uL (ref 0.7–4.0)
MCH: 29.1 pg (ref 26.0–34.0)
MCHC: 32.8 g/dL (ref 30.0–36.0)
MCV: 88.6 fL (ref 80.0–100.0)
Monocytes Absolute: 1.8 10*3/uL — ABNORMAL HIGH (ref 0.1–1.0)
Monocytes Relative: 7 %
Neutro Abs: 19.8 10*3/uL — ABNORMAL HIGH (ref 1.7–7.7)
Neutrophils Relative %: 84 %
Platelets: 111 10*3/uL — ABNORMAL LOW (ref 150–400)
RBC: 2.89 MIL/uL — ABNORMAL LOW (ref 3.87–5.11)
RDW: 21.3 % — ABNORMAL HIGH (ref 11.5–15.5)
WBC: 23.9 10*3/uL — ABNORMAL HIGH (ref 4.0–10.5)
nRBC: 1.1 % — ABNORMAL HIGH (ref 0.0–0.2)

## 2021-09-07 LAB — GLUCOSE, CAPILLARY
Glucose-Capillary: 93 mg/dL (ref 70–99)
Glucose-Capillary: 140 mg/dL — ABNORMAL HIGH (ref 70–99)
Glucose-Capillary: 82 mg/dL (ref 70–99)
Glucose-Capillary: 79 mg/dL (ref 70–99)
Glucose-Capillary: 71 mg/dL (ref 70–99)

## 2021-09-07 LAB — T4, FREE: Free T4: 2.14 ng/dL — ABNORMAL HIGH (ref 0.61–1.12)

## 2021-09-07 LAB — CULTURE, RESPIRATORY W GRAM STAIN

## 2021-09-07 LAB — BASIC METABOLIC PANEL
Anion gap: 10 (ref 5–15)
Anion gap: 9 (ref 5–15)
BUN: 32 mg/dL — ABNORMAL HIGH (ref 6–20)
BUN: 33 mg/dL — ABNORMAL HIGH (ref 6–20)
CO2: 24 mmol/L (ref 22–32)
CO2: 24 mmol/L (ref 22–32)
Calcium: 8.8 mg/dL — ABNORMAL LOW (ref 8.9–10.3)
Calcium: 8.9 mg/dL (ref 8.9–10.3)
Chloride: 98 mmol/L (ref 98–111)
Chloride: 100 mmol/L (ref 98–111)
Creatinine, Ser: 0.97 mg/dL (ref 0.44–1.00)
Creatinine, Ser: 0.88 mg/dL (ref 0.44–1.00)
GFR, Estimated: >60 mL/min (ref >60)
GFR, Estimated: >60 mL/min (ref >60)
Glucose, Bld: 86 mg/dL (ref 70–99)
Glucose, Bld: 138 mg/dL — ABNORMAL HIGH (ref 70–99)
Potassium: 3.2 mmol/L — ABNORMAL LOW (ref 3.5–5.1)
Potassium: 2.9 mmol/L — ABNORMAL LOW (ref 3.5–5.1)
Sodium: 132 mmol/L — ABNORMAL LOW (ref 135–145)
Sodium: 133 mmol/L — ABNORMAL LOW (ref 135–145)

## 2021-09-07 LAB — PROTIME-INR
INR: 3.3 — ABNORMAL HIGH (ref 0.8–1.2)
Prothrombin Time: 33.9 seconds — ABNORMAL HIGH (ref 11.4–15.2)

## 2021-09-07 LAB — PHOSPHORUS: Phosphorus: 2.5 mg/dL (ref 2.5–4.6)

## 2021-09-07 LAB — MAGNESIUM
Magnesium: 2.1 mg/dL (ref 1.7–2.4)
Magnesium: 1.9 mg/dL (ref 1.7–2.4)

## 2021-09-07 LAB — COOXEMETRY PANEL
Carboxyhemoglobin: 2.2 % — ABNORMAL HIGH (ref 0.5–1.5)
Methemoglobin: <0.7 % (ref 0.0–1.5)
O2 Saturation: 68 %
Total hemoglobin: 8.3 g/dL — ABNORMAL LOW (ref 12.0–16.0)

## 2021-09-07 LAB — LACTATE DEHYDROGENASE: LDH: 468 U/L — ABNORMAL HIGH (ref 98–192)

## 2021-09-07 LAB — TSH: TSH: 0.012 u[IU]/mL — ABNORMAL LOW (ref 0.350–4.500)

## 2021-09-07 MED ORDER — AMIODARONE LOAD VIA INFUSION
150.0000 mg | Freq: Once | INTRAVENOUS | Status: AC
  Administered 2021-09-07: 150 mg via INTRAVENOUS
  Filled 2021-09-07: qty 83.34

## 2021-09-07 MED ORDER — POTASSIUM CHLORIDE 10 MEQ/50ML IV SOLN
10.0000 meq | INTRAVENOUS | Status: AC
  Administered 2021-09-07 (×4): 10 meq via INTRAVENOUS
  Filled 2021-09-07 (×4): qty 50

## 2021-09-07 MED ORDER — SORBITOL 70 % SOLN
30.0000 mL | Freq: Once | Status: AC
  Administered 2021-09-07: 30 mL via ORAL
  Filled 2021-09-07: qty 30

## 2021-09-07 MED ORDER — POTASSIUM CHLORIDE CRYS ER 20 MEQ PO TBCR: 40.0000 meq | EXTENDED_RELEASE_TABLET | ORAL | Status: DC

## 2021-09-07 MED ORDER — POTASSIUM CHLORIDE CRYS ER 10 MEQ PO TBCR
20.0000 meq | EXTENDED_RELEASE_TABLET | Freq: Once | ORAL | Status: AC
  Administered 2021-09-07: 20 meq via ORAL
  Filled 2021-09-07: qty 2

## 2021-09-07 MED ORDER — POTASSIUM CHLORIDE 10 MEQ/50ML IV SOLN
10.0000 meq | INTRAVENOUS | Status: AC
  Administered 2021-09-07 (×6): 10 meq via INTRAVENOUS
  Filled 2021-09-07 (×6): qty 50

## 2021-09-07 MED ORDER — MAGNESIUM SULFATE 2 GM/50ML IV SOLN
2.0000 g | Freq: Once | INTRAVENOUS | Status: AC
  Administered 2021-09-07: 2 g via INTRAVENOUS
  Filled 2021-09-07: qty 50

## 2021-09-07 MED ORDER — METOLAZONE 2.5 MG PO TABS
2.5000 mg | ORAL_TABLET | Freq: Once | ORAL | Status: AC
  Administered 2021-09-07: 2.5 mg via ORAL
  Filled 2021-09-07: qty 1

## 2021-09-07 MED ORDER — POTASSIUM CHLORIDE CRYS ER 10 MEQ PO TBCR
40.0000 meq | EXTENDED_RELEASE_TABLET | Freq: Once | ORAL | Status: AC
  Administered 2021-09-07: 40 meq via ORAL
  Filled 2021-09-07: qty 4

## 2021-09-07 MED ORDER — POTASSIUM CHLORIDE CRYS ER 20 MEQ PO TBCR
80.0000 meq | EXTENDED_RELEASE_TABLET | Freq: Once | ORAL | Status: AC
Start: 2021-09-07 — End: 2021-09-07
  Administered 2021-09-07: 80 meq via ORAL
  Filled 2021-09-07: qty 4

## 2021-09-07 NOTE — Progress Notes (Addendum)
HeartMate 2 Rounding Note ? ?Subjective:   ? ?- 3/14: HM3 LVAD placement with removal of Impella 5.5  ?- 3/15: Speed increased to 5300 rpm. Given 1UPRBC. Febrile. Cultures obtained. Extubated.  ?- 3/16 VT paced out. On amio drip. Given 1UPRBCs . Given couple doses of IV lasix.  ? ?Off epi and NE. On milrinone 0.25 ? ?CO-OX 68%.  ? ?WBC falling 30K -> 26K -> 23k ? ?Hgb 8.4 ? ?On lasix gtt at 8.  3L out. Weight down 2 pounds  ? ? ?LVAD INTERROGATION:  ?HeartMate II LVAD:  Flow 4.1  liters/min, speed 5300, power 4.0, PI 3.2.  VAD interrogated personally. Parameters stable. ? ? ?Objective:   ? ?Vital Signs:   ?Temp:  [97.2 ?F (36.2 ?C)-98.4 ?F (36.9 ?C)] 98.4 ?F (36.9 ?C) (03/18 0400) ?Pulse Rate:  [78-173] 88 (03/18 0624) ?Resp:  [16-28] 25 (03/18 6789) ?SpO2:  [86 %-100 %] 98 % (03/18 0624) ?Arterial Line BP: (85-119)/(62-83) 98/70 (03/18 3810) ?Weight:  [112.7 kg] 112.7 kg (03/18 0500) ?Last BM Date : 09/01/21 ?Mean arterial Pressure 70s  ? ?Intake/Output:  ? ?Intake/Output Summary (Last 24 hours) at 09/07/2021 0756 ?Last data filed at 09/07/2021 0600 ?Gross per 24 hour  ?Intake 2709.59 ml  ?Output 3781 ml  ?Net -1071.41 ml  ? ?  ?Physical exam ?General:  Weak appearing. Sitting up.   ?HEENT: normal  ?Neck: supple. JVP not elevated.  Carotids 2+ bilat; no bruits. No lymphadenopathy or thryomegaly appreciated. ?Cor: LVAD hum.  ?Lungs: Clear. ?Abdomen: obese soft, nontender, non-distended. No hepatosplenomegaly. No bruits or masses. Good bowel sounds. Driveline site clean. Anchor in place.  ?Extremities: no cyanosis, clubbing, rash. Warm no edema  ?Neuro: alert & oriented x 3. No focal deficits. Moves all 4 without problem  ? ?Telemetry: Sinus 80s Personally reviewed ? ? ? ?Labs: ?Basic Metabolic Panel: ?Recent Labs  ?Lab 09/02/21 ?0311 09/02/21 ?0619 09/04/21 ?0311 09/04/21 ?0403 09/04/21 ?1553 09/04/21 ?1556 09/05/21 ?0400 09/05/21 ?1535 09/05/21 ?1537 09/06/21 ?0355 09/06/21 ?0415 09/06/21 ?1738 09/07/21 ?0147  09/07/21 ?1751  ?NA 135   < > 136   < > 135   < > 133* 131*   < > 130* 131* 131* 132* 134*  ?K 3.7   < > 4.3   < > 3.8   < > 3.7 3.6   < > 3.8 3.8 4.0 3.2* 3.0*  ?CL 94*   < > 100  --  98  --  98 96*  --  99  --  100 98  --   ?CO2 33*   < > 26  --  24  --  24 23  --  24  --  23 24  --   ?GLUCOSE 101*   < > 119*  --  120*  --  121* 148*  --  105*  --  130* 86  --   ?BUN 25*   < > 25*  --  27*  --  27* 29*  --  30*  --  31* 32*  --   ?CREATININE 0.91   < > 1.13*  --  1.19*  --  1.17* 1.06*  --  0.94  --  0.95 0.97  --   ?CALCIUM 9.1   < > 8.5*  --  8.4*  --  8.6* 8.3*  --  8.6*  --  8.7* 8.8*  --   ?MG 2.3   < > 2.5*  --  2.3  --  2.2  --   --  2.4  --   --  2.1  --   ?PHOS 4.6  --  6.1*  --   --   --  4.1  --   --  3.2  --   --  2.5  --   ? < > = values in this interval not displayed.  ? ? ? ?Liver Function Tests: ?Recent Labs  ?Lab 09/01/21 ?0331 09/04/21 ?0311 09/05/21 ?0400 09/06/21 ?0355  ?AST 76* 92* 85* 53*  ?ALT 65* '31 27 25  '$ ?ALKPHOS 104 37* 44 60  ?BILITOT 1.0 1.5* 2.1* 1.6*  ?PROT 5.7* 4.4* 4.8* 5.0*  ?ALBUMIN 2.7* 3.0* 2.8* 2.5*  ? ? ?No results for input(s): LIPASE, AMYLASE in the last 168 hours. ?No results for input(s): AMMONIA in the last 168 hours. ? ?CBC: ?Recent Labs  ?Lab 09/05/21 ?0400 09/05/21 ?1535 09/05/21 ?1537 09/06/21 ?0355 09/06/21 ?0415 09/06/21 ?1738 09/07/21 ?0147 09/07/21 ?3716  ?WBC 29.5* 30.5*  --  30.8*  --  26.6* 23.9*  --   ?NEUTROABS 20.8* 23.4*  --  23.8*  --  22.1* 19.8*  --   ?HGB 8.0* 8.9*   < > 8.8* 8.5* 8.8* 8.4* 8.5*  ?HCT 25.0* 27.2*   < > 26.3* 25.0* 26.9* 25.6* 25.0*  ?MCV 88.3 87.7  --  87.7  --  88.8 88.6  --   ?PLT 113* 98*  --  106*  --  116* 111*  --   ? < > = values in this interval not displayed.  ? ? ? ?INR: ?Recent Labs  ?Lab 09/03/21 ?1611 09/04/21 ?0311 09/05/21 ?0400 09/06/21 ?0355 09/07/21 ?0147  ?INR 1.5* 0.8 1.6* 2.4* 3.3*  ? ? ? ?Other results: ? ? ?Imaging: ?DG Chest Port 1 View ? ?Result Date: 09/06/2021 ?CLINICAL DATA:  LVAD EXAM: PORTABLE CHEST 1 VIEW  COMPARISON:  September 05, 2021 FINDINGS: Left chest AICD/pacemaker with leads in unchanged position. Stable positioning of the left ventricular cyst device. Removal of the right IJ approach Swan-Ganz catheter. Interval placement of a right upper extremity PICC with tip overlying the right atrium. Defibrillator leads overlie the chest/upper abdomen. Enlarged cardiac silhouette is unchanged from prior. Low lung volumes with bibasilar atelectasis. No visible pleural effusion or pneumothorax. Prior median sternotomy. Gaseous lead distended loops of bowel in the left upper abdomen. IMPRESSION: 1. Interval placement of a right upper extremity PICC with tip overlying the right atrium. 2. Low lung volumes with bibasilar atelectasis. Electronically Signed   By: Dahlia Bailiff M.D.   On: 09/06/2021 08:15   ? ? ?Medications:   ? ? ?Scheduled Medications: ? (feeding supplement) PROSource Plus  60 mL Oral TID BM  ? sodium chloride   Intravenous Once  ? sodium chloride   Intravenous Once  ? acetaminophen  1,000 mg Oral Q6H  ? Or  ? acetaminophen (TYLENOL) oral liquid 160 mg/5 mL  1,000 mg Oral Q6H  ? aspirin EC  81 mg Oral Daily  ? bisacodyl  10 mg Oral Daily  ? Chlorhexidine Gluconate Cloth  6 each Topical Daily  ? Chlorhexidine Gluconate Cloth  6 each Topical Daily  ? insulin aspart  0-24 Units Subcutaneous Q4H  ? methimazole  10 mg Oral Daily  ? methimazole  5 mg Oral QPM  ? pantoprazole  40 mg Oral Daily  ? polyethylene glycol  17 g Oral Daily  ? predniSONE  20 mg Oral Q breakfast  ? quiniDINE gluconate  324 mg Oral BID  ? rosuvastatin  40 mg Oral QHS  ?  senna-docusate  2 tablet Oral BID  ? sodium chloride flush  10-40 mL Intracatheter Q12H  ? sodium chloride flush  10-40 mL Intracatheter Q12H  ? sodium chloride flush  3 mL Intravenous Q12H  ? Warfarin - Physician Dosing Inpatient   Does not apply q1600  ? ? ?Infusions: ? sodium chloride Stopped (09/05/21 1205)  ? sodium chloride    ? sodium chloride 10 mL/hr at 09/07/21 0600  ?  sodium chloride    ? amiodarone 30 mg/hr (09/07/21 0600)  ? ceFEPime (MAXIPIME) IV Stopped (09/07/21 0547)  ? epinephrine Stopped (09/06/21 1801)  ? furosemide (LASIX) 200 mg in dextrose 5% 100 mL ('2mg'$ /mL) infusion 8 mg/hr (09/07/21 0600)  ? lactated ringers    ? lactated ringers Stopped (09/05/21 1010)  ? milrinone 0.25 mcg/kg/min (09/07/21 0600)  ? norepinephrine (LEVOPHED) Adult infusion Stopped (09/06/21 1816)  ? ? ?PRN Medications: ?sodium chloride, Place/Maintain arterial line **AND** sodium chloride, midazolam, morphine injection, ondansetron (ZOFRAN) IV, oxyCODONE, sodium chloride flush, sodium chloride flush, sodium chloride flush, traMADol ? ? ? ?Plan/Discussion:   ? ? 1. Acute on chronic systolic HF: Nonischemic cardiomyopathy.  She has a [employer] ICD. Cause for cardiomyopathy is uncertain, initially identified peri-partum (after son's birth), so cannot rule out peri-partum CMP.  On and off, she has had frequent PVCs.   RHC in 8/15 showed normal filling pressures and preserved cardiac output. Echo in 4/22 with EF 25%, severe LV dilation.  CPX (8/22) with mild-moderate HF limitation.  Echo this admission with EF < 20% with severe dilation, normal RV size with mildly decreased systolic function, moderate-severe functional MR with dilated IVC. She was admitted with volume overload, NYHA class IV symptoms and low output. HCO3 18 and creatinine 1.5 on admit, co-ox 47%. Milrinone 0.25 started + lasix gtt.  I also suspect hyperthyroidism is playing a role in her CHF exacerbation. 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 Impella 5.5 placed 3/9.  Patient had HM3 LVAD placed 3/15. ?3/15 Speed increased to 5300 rpm. Flow 4.2   . ?- Off epi/NE. On milrinone 0.25, co-ox 68% ?- LDH ok. INR 3.3 Anticoagulation start to be directed by TCTS ?- Continue asa 81 mg daily. Will need long-term with h/o CVA ?- Volume  status remains  elevated. Now on lasix gtt at 8. Weight down 2 pounds. Weight still up 25 pounds. Will give metolazone 2.5 x 1. Supp K ?- Renal function stable.  ?- May need sildenafil down the road once off pre

## 2021-09-07 NOTE — Progress Notes (Signed)
Amio, milrinone line noted to be precipitated this AM. Albumin was in line but not on. ? ?Lines changed. Albumin taken down. ? ?MD made aware in case patient not adequately receiving amio and that may be contributing to ectopy. ?

## 2021-09-07 NOTE — Progress Notes (Signed)
Patient states she "feels slow" and " like she did when she had her stroke." RN assessed patient. She is a&Ox4, no slurred speech, visual fields intact, able to move all four extremities. She did state lack of sensation in her right leg which she felt was due to her compression socks. RN took compression socks off and she states her feeling is better in that leg. ? ?RN will continue to monitor patient closely.  ?

## 2021-09-08 ENCOUNTER — Inpatient Hospital Stay (HOSPITAL_COMMUNITY): Payer: BC Managed Care – PPO

## 2021-09-08 DIAGNOSIS — I509 Heart failure, unspecified: Secondary | ICD-10-CM | POA: Diagnosis not present

## 2021-09-08 LAB — CBC WITH DIFFERENTIAL/PLATELET
Abs Immature Granulocytes: 0.8 10*3/uL — ABNORMAL HIGH (ref 0.00–0.07)
Basophils Absolute: 0 10*3/uL (ref 0.0–0.1)
Basophils Relative: 0 %
Eosinophils Absolute: 0 10*3/uL (ref 0.0–0.5)
Eosinophils Relative: 0 %
HCT: 28.9 % — ABNORMAL LOW (ref 36.0–46.0)
Hemoglobin: 9.5 g/dL — ABNORMAL LOW (ref 12.0–15.0)
Lymphocytes Relative: 7 %
Lymphs Abs: 1.8 10*3/uL (ref 0.7–4.0)
MCH: 28.9 pg (ref 26.0–34.0)
MCHC: 32.9 g/dL (ref 30.0–36.0)
MCV: 87.8 fL (ref 80.0–100.0)
Metamyelocytes Relative: 1 %
Monocytes Absolute: 0.5 10*3/uL (ref 0.1–1.0)
Monocytes Relative: 2 %
Myelocytes: 2 %
Neutro Abs: 22.1 10*3/uL — ABNORMAL HIGH (ref 1.7–7.7)
Neutrophils Relative %: 88 %
Platelets: 176 10*3/uL (ref 150–400)
RBC: 3.29 MIL/uL — ABNORMAL LOW (ref 3.87–5.11)
RDW: 21.7 % — ABNORMAL HIGH (ref 11.5–15.5)
WBC: 25.1 10*3/uL — ABNORMAL HIGH (ref 4.0–10.5)
nRBC: 0 /100 WBC
nRBC: 0.3 % — ABNORMAL HIGH (ref 0.0–0.2)

## 2021-09-08 LAB — T3: T3, Total: 86 ng/dL (ref 71–180)

## 2021-09-08 LAB — BASIC METABOLIC PANEL
Anion gap: 12 (ref 5–15)
Anion gap: 12 (ref 5–15)
Anion gap: 9 (ref 5–15)
Anion gap: 9 (ref 5–15)
BUN: 32 mg/dL — ABNORMAL HIGH (ref 6–20)
BUN: 31 mg/dL — ABNORMAL HIGH (ref 6–20)
BUN: 30 mg/dL — ABNORMAL HIGH (ref 6–20)
BUN: 34 mg/dL — ABNORMAL HIGH (ref 6–20)
CO2: 24 mmol/L (ref 22–32)
CO2: 23 mmol/L (ref 22–32)
CO2: 25 mmol/L (ref 22–32)
CO2: 25 mmol/L (ref 22–32)
Calcium: 8.8 mg/dL — ABNORMAL LOW (ref 8.9–10.3)
Calcium: 9 mg/dL (ref 8.9–10.3)
Calcium: 8.7 mg/dL — ABNORMAL LOW (ref 8.9–10.3)
Calcium: 9.2 mg/dL (ref 8.9–10.3)
Chloride: 95 mmol/L — ABNORMAL LOW (ref 98–111)
Chloride: 99 mmol/L (ref 98–111)
Chloride: 96 mmol/L — ABNORMAL LOW (ref 98–111)
Chloride: 99 mmol/L (ref 98–111)
Creatinine, Ser: 0.84 mg/dL (ref 0.44–1.00)
Creatinine, Ser: 1.04 mg/dL — ABNORMAL HIGH (ref 0.44–1.00)
Creatinine, Ser: 0.93 mg/dL (ref 0.44–1.00)
Creatinine, Ser: 0.94 mg/dL (ref 0.44–1.00)
GFR, Estimated: >60 mL/min (ref >60)
GFR, Estimated: >60 mL/min (ref >60)
GFR, Estimated: >60 mL/min (ref >60)
GFR, Estimated: >60 mL/min (ref >60)
Glucose, Bld: 101 mg/dL — ABNORMAL HIGH (ref 70–99)
Glucose, Bld: 130 mg/dL — ABNORMAL HIGH (ref 70–99)
Glucose, Bld: 230 mg/dL — ABNORMAL HIGH (ref 70–99)
Glucose, Bld: 112 mg/dL — ABNORMAL HIGH (ref 70–99)
Potassium: 2.6 mmol/L — CL (ref 3.5–5.1)
Potassium: 3.3 mmol/L — ABNORMAL LOW (ref 3.5–5.1)
Potassium: 5.9 mmol/L — ABNORMAL HIGH (ref 3.5–5.1)
Potassium: 3.7 mmol/L (ref 3.5–5.1)
Sodium: 131 mmol/L — ABNORMAL LOW (ref 135–145)
Sodium: 134 mmol/L — ABNORMAL LOW (ref 135–145)
Sodium: 130 mmol/L — ABNORMAL LOW (ref 135–145)
Sodium: 133 mmol/L — ABNORMAL LOW (ref 135–145)

## 2021-09-08 LAB — COOXEMETRY PANEL
Carboxyhemoglobin: 1.3 % (ref 0.5–1.5)
Methemoglobin: 1.1 % (ref 0.0–1.5)
O2 Saturation: 73 %
Total hemoglobin: 9.1 g/dL — ABNORMAL LOW (ref 12.0–16.0)

## 2021-09-08 LAB — POCT I-STAT 7, (LYTES, BLD GAS, ICA,H+H)
Acid-Base Excess: 5 mmol/L — ABNORMAL HIGH (ref 0.0–2.0)
Bicarbonate: 28.5 mmol/L — ABNORMAL HIGH (ref 20.0–28.0)
Calcium, Ion: 1.23 mmol/L (ref 1.15–1.40)
HCT: 29 % — ABNORMAL LOW (ref 36.0–46.0)
Hemoglobin: 9.9 g/dL — ABNORMAL LOW (ref 12.0–15.0)
O2 Saturation: 99 %
Patient temperature: 36.6
Potassium: 3 mmol/L — ABNORMAL LOW (ref 3.5–5.1)
Sodium: 135 mmol/L (ref 135–145)
TCO2: 30 mmol/L (ref 22–32)
pCO2 arterial: 35.9 mmHg (ref 32–48)
pH, Arterial: 7.508 — ABNORMAL HIGH (ref 7.35–7.45)
pO2, Arterial: 138 mmHg — ABNORMAL HIGH (ref 83–108)

## 2021-09-08 LAB — GLUCOSE, CAPILLARY
Glucose-Capillary: 104 mg/dL — ABNORMAL HIGH (ref 70–99)
Glucose-Capillary: 108 mg/dL — ABNORMAL HIGH (ref 70–99)
Glucose-Capillary: 126 mg/dL — ABNORMAL HIGH (ref 70–99)
Glucose-Capillary: 89 mg/dL (ref 70–99)
Glucose-Capillary: 92 mg/dL (ref 70–99)
Glucose-Capillary: 95 mg/dL (ref 70–99)

## 2021-09-08 LAB — MAGNESIUM: Magnesium: 2.1 mg/dL (ref 1.7–2.4)

## 2021-09-08 LAB — PHOSPHORUS: Phosphorus: 2.3 mg/dL — ABNORMAL LOW (ref 2.5–4.6)

## 2021-09-08 LAB — LACTATE DEHYDROGENASE: LDH: 492 U/L — ABNORMAL HIGH (ref 98–192)

## 2021-09-08 LAB — PROTIME-INR
INR: 2.5 — ABNORMAL HIGH (ref 0.8–1.2)
Prothrombin Time: 26.6 seconds — ABNORMAL HIGH (ref 11.4–15.2)

## 2021-09-08 MED ORDER — POTASSIUM CHLORIDE CRYS ER 10 MEQ PO TBCR
40.0000 meq | EXTENDED_RELEASE_TABLET | Freq: Once | ORAL | Status: AC
  Administered 2021-09-08: 40 meq via ORAL
  Filled 2021-09-08: qty 4

## 2021-09-08 MED ORDER — SPIRONOLACTONE 25 MG PO TABS
25.0000 mg | ORAL_TABLET | Freq: Every day | ORAL | Status: DC
  Administered 2021-09-08 – 2021-09-14 (×7): 25 mg via ORAL
  Filled 2021-09-08 (×7): qty 1

## 2021-09-08 MED ORDER — POTASSIUM CHLORIDE CRYS ER 10 MEQ PO TBCR
40.0000 meq | EXTENDED_RELEASE_TABLET | Freq: Once | ORAL | Status: AC
Start: 2021-09-08 — End: 2021-09-08
  Administered 2021-09-08: 40 meq via ORAL
  Filled 2021-09-08: qty 4

## 2021-09-08 MED ORDER — POTASSIUM CHLORIDE 10 MEQ/50ML IV SOLN: 10.0000 meq | INTRAVENOUS | Status: DC

## 2021-09-08 MED ORDER — POTASSIUM CHLORIDE 10 MEQ/50ML IV SOLN
10.0000 meq | INTRAVENOUS | Status: AC
  Administered 2021-09-08 (×4): 10 meq via INTRAVENOUS
  Filled 2021-09-08 (×4): qty 50

## 2021-09-08 MED ORDER — POTASSIUM CHLORIDE 10 MEQ/50ML IV SOLN
10.0000 meq | INTRAVENOUS | Status: AC
  Administered 2021-09-08 – 2021-09-09 (×3): 10 meq via INTRAVENOUS
  Filled 2021-09-08 (×3): qty 50

## 2021-09-08 MED ORDER — K PHOS MONO-SOD PHOS DI & MONO 155-852-130 MG PO TABS
500.0000 mg | ORAL_TABLET | Freq: Once | ORAL | Status: AC
  Administered 2021-09-08: 500 mg via ORAL
  Filled 2021-09-08: qty 2

## 2021-09-08 MED ORDER — CLONAZEPAM 0.25 MG PO TBDP
0.2500 mg | ORAL_TABLET | Freq: Once | ORAL | Status: AC
  Administered 2021-09-08: 0.25 mg via ORAL
  Filled 2021-09-08: qty 1

## 2021-09-08 NOTE — Progress Notes (Signed)
Drive line dressing change:  ? LVAD driveline dressing was changed using proper sterile technique. Old dressing removed. Driveline site cleaned using chloroprep x2. Silver strip applied. New gauze dressing applied using sterile technique. Driveline secured at driveline site. Driveline site clean, no active drainage from site, no redness, no swelling, no tenderness, no odor from drainage. Driveline anchor Changed. New driveline anchor intact and applied correctly.  ? ?NEXT DRESSING CHANGE DUE 09/08/2021.  ? ?Alma Friendly RN  ? ?

## 2021-09-08 NOTE — Progress Notes (Addendum)
HeartMate 2 Rounding Note ? ?Subjective:   ? ?- 3/14: HM3 LVAD placement with removal of Impella 5.5  ?- 3/15: Speed increased to 5300 rpm. Given 1UPRBC. Febrile. Cultures obtained. Extubated.  ?- 3/16 VT paced out. On amio drip. Given 1UPRBCs . Given couple doses of IV lasix.  ? ?Was in rapid AFL last night. Bolused multiple times with amio and drip turned up to 60. Now back in NSR ? ?Remains on milrinone 0.25. Co-ox 73% Lasix gtt turned down from 8->6 last night. Out 3L. Weight down another 5 pounds. CVP 12. K has been low and requiring IV supp. K 3.3 this am  ? ?MAPs 80-90s ? ?Feels a bit stronger today. Denies SOB, orthopnea or PND.  ? ?Remains on cefepime WBC 24K -> 25K. No fevers. On po prednisone.  ? ?INR 2.5  ? ?LVAD INTERROGATION:  ?HeartMate II LVAD:  Flow 4.0  liters/min, speed 5300, power 4.0, PI 4.4-5.1  VAD interrogated personally. Parameters stable. ? ? ?Objective:   ? ?Vital Signs:   ?Temp:  [96.8 ?F (36 ?C)-97.9 ?F (36.6 ?C)] 97.7 ?F (36.5 ?C) (03/19 0800) ?Pulse Rate:  [90-198] 102 (03/18 2300) ?Resp:  [17-32] 27 (03/19 0800) ?SpO2:  [90 %-100 %] 100 % (03/19 0000) ?Arterial Line BP: (77-123)/(65-83) 92/79 (03/19 0800) ?Weight:  [110.6 kg] 110.6 kg (03/19 0547) ?Last BM Date : 09/08/21 ?Mean arterial Pressure 70s ? ?Intake/Output:  ? ?Intake/Output Summary (Last 24 hours) at 09/08/2021 0936 ?Last data filed at 09/08/2021 0800 ?Gross per 24 hour  ?Intake 3405.04 ml  ?Output 5105 ml  ?Net -1699.96 ml  ? ?  ?Physical exam ?General:  Weak appearing. Sitting up.   ?HEENT: normal  ?Neck: supple. JVP to jaw  Carotids 2+ bilat; no bruits. No lymphadenopathy or thryomegaly appreciated. ?Cor: LVAD hum.  ?Lungs: Clear. ?Abdomen: obese soft, nontender, non-distended. No hepatosplenomegaly. No bruits or masses. Good bowel sounds. Driveline site clean. Anchor in place.  ?Extremities: no cyanosis, clubbing, rash. 2+ edema ?Neuro: alert & oriented x 3. No focal deficits. Moves all 4 without problem  ? ? ?Telemetry:  Sinus 90-100  AFL overnight Personally reviewed ? ? ? ?Labs: ?Basic Metabolic Panel: ?Recent Labs  ?Lab 09/04/21 ?0311 09/04/21 ?0403 09/05/21 ?0400 09/05/21 ?1535 09/06/21 ?0355 09/06/21 ?0415 09/06/21 ?1738 09/07/21 ?0147 09/07/21 ?3825 09/07/21 ?1700 09/07/21 ?2003 09/08/21 ?0141 09/08/21 ?0406 09/08/21 ?0539  ?NA 136   < > 133*   < > 130*   < > 131* 132* 134* 133*  --  131* 135 134*  ?K 4.3   < > 3.7   < > 3.8   < > 4.0 3.2* 3.0* 2.9*  --  2.6* 3.0* 3.3*  ?CL 100   < > 98   < > 99  --  100 98  --  100  --  95*  --  99  ?CO2 26   < > 24   < > 24  --  23 24  --  24  --  24  --  23  ?GLUCOSE 119*   < > 121*   < > 105*  --  130* 86  --  138*  --  101*  --  130*  ?BUN 25*   < > 27*   < > 30*  --  31* 32*  --  33*  --  32*  --  31*  ?CREATININE 1.13*   < > 1.17*   < > 0.94  --  0.95 0.97  --  0.88  --  0.84  --  1.04*  ?CALCIUM 8.5*   < > 8.6*   < > 8.6*  --  8.7* 8.8*  --  8.9  --  8.8*  --  9.0  ?MG 2.5*   < > 2.2  --  2.4  --   --  2.1  --   --  1.9 2.1  --   --   ?PHOS 6.1*  --  4.1  --  3.2  --   --  2.5  --   --   --  2.3*  --   --   ? < > = values in this interval not displayed.  ? ? ? ?Liver Function Tests: ?Recent Labs  ?Lab 09/04/21 ?0311 09/05/21 ?0400 09/06/21 ?0355  ?AST 92* 85* 53*  ?ALT '31 27 25  '$ ?ALKPHOS 37* 44 60  ?BILITOT 1.5* 2.1* 1.6*  ?PROT 4.4* 4.8* 5.0*  ?ALBUMIN 3.0* 2.8* 2.5*  ? ? ?No results for input(s): LIPASE, AMYLASE in the last 168 hours. ?No results for input(s): AMMONIA in the last 168 hours. ? ?CBC: ?Recent Labs  ?Lab 09/05/21 ?1535 09/05/21 ?1537 09/06/21 ?0355 09/06/21 ?0415 09/06/21 ?1738 09/07/21 ?0147 09/07/21 ?5974 09/08/21 ?0406 09/08/21 ?0408  ?WBC 30.5*  --  30.8*  --  26.6* 23.9*  --   --  25.1*  ?NEUTROABS 23.4*  --  23.8*  --  22.1* 19.8*  --   --  22.1*  ?HGB 8.9*   < > 8.8*   < > 8.8* 8.4* 8.5* 9.9* 9.5*  ?HCT 27.2*   < > 26.3*   < > 26.9* 25.6* 25.0* 29.0* 28.9*  ?MCV 87.7  --  87.7  --  88.8 88.6  --   --  87.8  ?PLT 98*  --  106*  --  116* 111*  --   --  176  ? < > =  values in this interval not displayed.  ? ? ? ?INR: ?Recent Labs  ?Lab 09/04/21 ?0311 09/05/21 ?0400 09/06/21 ?0355 09/07/21 ?0147 09/08/21 ?0408  ?INR 0.8 1.6* 2.4* 3.3* 2.5*  ? ? ? ?Other results: ? ? ?Imaging: ?DG Chest Port 1 View ? ?Result Date: 09/07/2021 ?CLINICAL DATA:  Shortness of breath. EXAM: PORTABLE CHEST 1 VIEW COMPARISON:  September 06, 2021 FINDINGS: The LVAD is in stable position. The AICD leads are parent we stable. The right PICC line is stable. No pneumothorax. Evaluation of the lungs is limited due to low volumes, portable technique, and support apparatus over the left chest. No convincing nodules, masses, or focal infiltrates. No overt edema. Bilateral chest tubes are stable. IMPRESSION: 1. Support apparatus as above. 2. Evaluation of the lungs is limited but no acute abnormalities are seen. Electronically Signed   By: Dorise Bullion III M.D.   On: 09/07/2021 08:17   ? ? ?Medications:   ? ? ?Scheduled Medications: ? (feeding supplement) PROSource Plus  60 mL Oral TID BM  ? sodium chloride   Intravenous Once  ? sodium chloride   Intravenous Once  ? acetaminophen  1,000 mg Oral Q6H  ? Or  ? acetaminophen (TYLENOL) oral liquid 160 mg/5 mL  1,000 mg Oral Q6H  ? aspirin EC  81 mg Oral Daily  ? bisacodyl  10 mg Oral Daily  ? Chlorhexidine Gluconate Cloth  6 each Topical Daily  ? Chlorhexidine Gluconate Cloth  6 each Topical Daily  ? insulin aspart  0-24 Units Subcutaneous Q4H  ? methimazole  10 mg Oral  Daily  ? methimazole  5 mg Oral QPM  ? pantoprazole  40 mg Oral Daily  ? polyethylene glycol  17 g Oral Daily  ? potassium chloride  40 mEq Oral Once  ? potassium chloride  40 mEq Oral Once  ? predniSONE  20 mg Oral Q breakfast  ? quiniDINE gluconate  324 mg Oral BID  ? rosuvastatin  40 mg Oral QHS  ? senna-docusate  2 tablet Oral BID  ? sodium chloride flush  10-40 mL Intracatheter Q12H  ? sodium chloride flush  10-40 mL Intracatheter Q12H  ? sodium chloride flush  3 mL Intravenous Q12H  ? Warfarin -  Physician Dosing Inpatient   Does not apply q1600  ? ? ?Infusions: ? sodium chloride Stopped (09/05/21 1205)  ? sodium chloride    ? sodium chloride 10 mL/hr at 09/08/21 0800  ? sodium chloride    ? amiodarone 60 mg/hr (09/08/21 0800)  ? ceFEPime (MAXIPIME) IV Stopped (09/08/21 7494)  ? furosemide (LASIX) 200 mg in dextrose 5% 100 mL ('2mg'$ /mL) infusion 6 mg/hr (09/08/21 0800)  ? lactated ringers    ? lactated ringers Stopped (09/05/21 1010)  ? milrinone 0.25 mcg/kg/min (09/08/21 0800)  ? potassium chloride    ? ? ?PRN Medications: ?sodium chloride, Place/Maintain arterial line **AND** sodium chloride, midazolam, morphine injection, ondansetron (ZOFRAN) IV, oxyCODONE, sodium chloride flush, sodium chloride flush, sodium chloride flush, traMADol ? ? ? ?Plan/Discussion:   ? ? 1. Acute on chronic systolic HF: Nonischemic cardiomyopathy.  She has a Medtronic ICD. Cause for cardiomyopathy is uncertain, initially identified peri-partum (after son's birth), so cannot rule out peri-partum CMP.  On and off, she has had frequent PVCs.   RHC in 8/15 showed normal filling pressures and preserved cardiac output. Echo in 4/22 with EF 25%, severe LV dilation.  CPX (8/22) with mild-moderate HF limitation.  Echo this admission with EF < 20% with severe dilation, normal RV size with mildly decreased systolic function, moderate-severe functional MR with dilated IVC. She was admitted with volume overload, NYHA class IV symptoms and low output. HCO3 18 and creatinine 1.5 on admit, co-ox 47%. Milrinone 0.25 started + lasix gtt.  I also suspect hyperthyroidism is playing a role in her CHF exacerbation. 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 Impella 5.5 placed 3/9.  Patient had HM3 LVAD placed 3/15. 3/15 Speed increased to 5300 rpm. Flow 4.2   . ?- Off epi/NE. On milrinone 0.25, co-ox 73%. Milrinone likely driving AFL a bit but will  continue at current dose until more fully diuresed ?- PI running high. I increased speed to 5400 ?- MAPs 80-90 add spiro  ?- LDH stable at 498. INR 2.5 Anticoagulation start to be directed by TCTS ?- Cornelius Moras

## 2021-09-09 ENCOUNTER — Inpatient Hospital Stay (HOSPITAL_COMMUNITY): Payer: BC Managed Care – PPO

## 2021-09-09 DIAGNOSIS — I5023 Acute on chronic systolic (congestive) heart failure: Secondary | ICD-10-CM | POA: Diagnosis not present

## 2021-09-09 DIAGNOSIS — Z95811 Presence of heart assist device: Secondary | ICD-10-CM | POA: Diagnosis not present

## 2021-09-09 DIAGNOSIS — R570 Cardiogenic shock: Secondary | ICD-10-CM | POA: Diagnosis not present

## 2021-09-09 DIAGNOSIS — I428 Other cardiomyopathies: Secondary | ICD-10-CM | POA: Diagnosis not present

## 2021-09-09 LAB — PHOSPHORUS: Phosphorus: 2.8 mg/dL (ref 2.5–4.6)

## 2021-09-09 LAB — COOXEMETRY PANEL
Carboxyhemoglobin: 2.3 % — ABNORMAL HIGH (ref 0.5–1.5)
Carboxyhemoglobin: 2.1 % — ABNORMAL HIGH (ref 0.5–1.5)
Methemoglobin: <0.7 % (ref 0.0–1.5)
Methemoglobin: <0.7 % (ref 0.0–1.5)
O2 Saturation: 65.4 %
O2 Saturation: 61.4 %
Total hemoglobin: 9.8 g/dL — ABNORMAL LOW (ref 12.0–16.0)
Total hemoglobin: 9.9 g/dL — ABNORMAL LOW (ref 12.0–16.0)

## 2021-09-09 LAB — MAGNESIUM
Magnesium: 1.7 mg/dL (ref 1.7–2.4)
Magnesium: 2.5 mg/dL — ABNORMAL HIGH (ref 1.7–2.4)

## 2021-09-09 LAB — BASIC METABOLIC PANEL
Anion gap: 10 (ref 5–15)
Anion gap: 11 (ref 5–15)
Anion gap: 11 (ref 5–15)
BUN: 33 mg/dL — ABNORMAL HIGH (ref 6–20)
BUN: 33 mg/dL — ABNORMAL HIGH (ref 6–20)
BUN: 34 mg/dL — ABNORMAL HIGH (ref 6–20)
CO2: 26 mmol/L (ref 22–32)
CO2: 26 mmol/L (ref 22–32)
CO2: 26 mmol/L (ref 22–32)
Calcium: 9.2 mg/dL (ref 8.9–10.3)
Calcium: 9.2 mg/dL (ref 8.9–10.3)
Calcium: 9.3 mg/dL (ref 8.9–10.3)
Chloride: 99 mmol/L (ref 98–111)
Chloride: 98 mmol/L (ref 98–111)
Chloride: 97 mmol/L — ABNORMAL LOW (ref 98–111)
Creatinine, Ser: 0.86 mg/dL (ref 0.44–1.00)
Creatinine, Ser: 0.91 mg/dL (ref 0.44–1.00)
Creatinine, Ser: 0.93 mg/dL (ref 0.44–1.00)
GFR, Estimated: >60 mL/min (ref >60)
GFR, Estimated: >60 mL/min (ref >60)
GFR, Estimated: >60 mL/min (ref >60)
Glucose, Bld: 99 mg/dL (ref 70–99)
Glucose, Bld: 133 mg/dL — ABNORMAL HIGH (ref 70–99)
Glucose, Bld: 113 mg/dL — ABNORMAL HIGH (ref 70–99)
Potassium: 3.6 mmol/L (ref 3.5–5.1)
Potassium: 3.8 mmol/L (ref 3.5–5.1)
Potassium: 3.6 mmol/L (ref 3.5–5.1)
Sodium: 135 mmol/L (ref 135–145)
Sodium: 135 mmol/L (ref 135–145)
Sodium: 134 mmol/L — ABNORMAL LOW (ref 135–145)

## 2021-09-09 LAB — CBC WITH DIFFERENTIAL/PLATELET
Abs Immature Granulocytes: 1.3 10*3/uL — ABNORMAL HIGH (ref 0.00–0.07)
Basophils Absolute: 0.1 10*3/uL (ref 0.0–0.1)
Basophils Relative: 0 %
Eosinophils Absolute: 0 10*3/uL (ref 0.0–0.5)
Eosinophils Relative: 0 %
HCT: 29.9 % — ABNORMAL LOW (ref 36.0–46.0)
Hemoglobin: 9.6 g/dL — ABNORMAL LOW (ref 12.0–15.0)
Immature Granulocytes: 4 %
Lymphocytes Relative: 7 %
Lymphs Abs: 2.1 10*3/uL (ref 0.7–4.0)
MCH: 28.6 pg (ref 26.0–34.0)
MCHC: 32.1 g/dL (ref 30.0–36.0)
MCV: 89 fL (ref 80.0–100.0)
Monocytes Absolute: 2.2 10*3/uL — ABNORMAL HIGH (ref 0.1–1.0)
Monocytes Relative: 7 %
Neutro Abs: 25 10*3/uL — ABNORMAL HIGH (ref 1.7–7.7)
Neutrophils Relative %: 82 %
Platelets: 240 10*3/uL (ref 150–400)
RBC: 3.36 MIL/uL — ABNORMAL LOW (ref 3.87–5.11)
RDW: 21.9 % — ABNORMAL HIGH (ref 11.5–15.5)
WBC: 30.7 10*3/uL — ABNORMAL HIGH (ref 4.0–10.5)
nRBC: 0.1 % (ref 0.0–0.2)

## 2021-09-09 LAB — GLUCOSE, CAPILLARY
Glucose-Capillary: 102 mg/dL — ABNORMAL HIGH (ref 70–99)
Glucose-Capillary: 106 mg/dL — ABNORMAL HIGH (ref 70–99)
Glucose-Capillary: 123 mg/dL — ABNORMAL HIGH (ref 70–99)
Glucose-Capillary: 144 mg/dL — ABNORMAL HIGH (ref 70–99)
Glucose-Capillary: 93 mg/dL (ref 70–99)
Glucose-Capillary: 94 mg/dL (ref 70–99)

## 2021-09-09 LAB — CULTURE, BLOOD (ROUTINE X 2)
Culture: NO GROWTH
Culture: NO GROWTH
Special Requests: ADEQUATE
Special Requests: ADEQUATE

## 2021-09-09 LAB — PROTIME-INR
INR: 2.9 — ABNORMAL HIGH (ref 0.8–1.2)
Prothrombin Time: 30.1 seconds — ABNORMAL HIGH (ref 11.4–15.2)

## 2021-09-09 LAB — LACTATE DEHYDROGENASE: LDH: 519 U/L — ABNORMAL HIGH (ref 98–192)

## 2021-09-09 MED ORDER — POTASSIUM CHLORIDE 10 MEQ/50ML IV SOLN
10.0000 meq | INTRAVENOUS | Status: AC
  Administered 2021-09-09 (×3): 10 meq via INTRAVENOUS
  Filled 2021-09-09 (×3): qty 50

## 2021-09-09 MED ORDER — MILRINONE LACTATE IN DEXTROSE 20-5 MG/100ML-% IV SOLN
0.1250 ug/kg/min | INTRAVENOUS | Status: DC
Start: 2021-09-09 — End: 2021-09-09
  Administered 2021-09-09: 0.125 ug/kg/min via INTRAVENOUS
  Filled 2021-09-09: qty 100

## 2021-09-09 MED ORDER — AMIODARONE LOAD VIA INFUSION
150.0000 mg | Freq: Once | INTRAVENOUS | Status: AC
  Administered 2021-09-09: 150 mg via INTRAVENOUS

## 2021-09-09 MED ORDER — SILDENAFIL CITRATE 20 MG PO TABS
20.0000 mg | ORAL_TABLET | Freq: Three times a day (TID) | ORAL | Status: DC
  Administered 2021-09-09 – 2021-09-20 (×34): 20 mg via ORAL
  Filled 2021-09-09 (×35): qty 1

## 2021-09-09 MED ORDER — DIGOXIN 125 MCG PO TABS
0.0625 mg | ORAL_TABLET | Freq: Every day | ORAL | Status: DC
  Administered 2021-09-09 – 2021-09-20 (×12): 0.0625 mg via ORAL
  Filled 2021-09-09 (×12): qty 1

## 2021-09-09 MED ORDER — INSULIN ASPART 100 UNIT/ML IJ SOLN
0.0000 [IU] | Freq: Three times a day (TID) | INTRAMUSCULAR | Status: DC
  Administered 2021-09-09 – 2021-09-20 (×13): 2 [IU] via SUBCUTANEOUS

## 2021-09-09 MED ORDER — BOOST / RESOURCE BREEZE PO LIQD CUSTOM
1.0000 | Freq: Two times a day (BID) | ORAL | Status: DC
  Administered 2021-09-11 – 2021-09-12 (×3): 1 via ORAL

## 2021-09-09 MED ORDER — METOLAZONE 2.5 MG PO TABS
2.5000 mg | ORAL_TABLET | Freq: Once | ORAL | Status: AC
  Administered 2021-09-09: 2.5 mg via ORAL
  Filled 2021-09-09: qty 1

## 2021-09-09 MED ORDER — MAGNESIUM SULFATE IN D5W 1-5 GM/100ML-% IV SOLN
1.0000 g | Freq: Once | INTRAVENOUS | Status: AC
  Administered 2021-09-09: 1 g via INTRAVENOUS
  Filled 2021-09-09: qty 100

## 2021-09-09 MED ORDER — POTASSIUM CHLORIDE CRYS ER 10 MEQ PO TBCR
40.0000 meq | EXTENDED_RELEASE_TABLET | Freq: Once | ORAL | Status: AC
  Administered 2021-09-09: 40 meq via ORAL
  Filled 2021-09-09: qty 4

## 2021-09-09 MED ORDER — ALPRAZOLAM 0.25 MG PO TABS
0.2500 mg | ORAL_TABLET | Freq: Three times a day (TID) | ORAL | Status: DC | PRN
  Administered 2021-09-09 – 2021-09-15 (×12): 0.25 mg via ORAL
  Filled 2021-09-09 (×14): qty 1

## 2021-09-09 MED ORDER — MAGNESIUM SULFATE 2 GM/50ML IV SOLN
2.0000 g | Freq: Once | INTRAVENOUS | Status: AC
  Administered 2021-09-09: 2 g via INTRAVENOUS
  Filled 2021-09-09: qty 50

## 2021-09-09 MED ORDER — MAGNESIUM SULFATE 2 GM/50ML IV SOLN: 2.0000 g | Freq: Once | INTRAVENOUS | Status: DC

## 2021-09-09 MED ORDER — BOOST / RESOURCE BREEZE PO LIQD CUSTOM
1.0000 | Freq: Two times a day (BID) | ORAL | Status: DC
  Administered 2021-09-09: 1 via ORAL

## 2021-09-09 MED ORDER — POTASSIUM CHLORIDE CRYS ER 10 MEQ PO TBCR
40.0000 meq | EXTENDED_RELEASE_TABLET | ORAL | Status: AC
  Administered 2021-09-09 (×2): 40 meq via ORAL
  Filled 2021-09-09: qty 2
  Filled 2021-09-09: qty 4

## 2021-09-09 MED ORDER — POTASSIUM CHLORIDE 10 MEQ/50ML IV SOLN
10.0000 meq | INTRAVENOUS | Status: AC
  Administered 2021-09-09 – 2021-09-10 (×3): 10 meq via INTRAVENOUS
  Filled 2021-09-09 (×3): qty 50

## 2021-09-09 NOTE — Progress Notes (Signed)
LVAD Coordinator Rounding Note: ? ?Admitted 08/26/21 due to acute on chronic systolic heart failure. Receiving ongoing treatment for hyperthyroidism with Methimazole and steroids. On 08/29/21 Impella 5.5 placed.  ? ?HM III LVAD implanted on 09/03/21 by Dr Prescott Gum under destination therapy criteria due to elevated BMI.  ? ?Pt sitting up in the recliner upon my arrival. She reports that she is feeling less anxious this morning after her husband came to visit early this morning. PT at bedside to walk with pt in the hallway. She reports continued poor appetite "due to fluid on her abdomen." She drank a boost and ate half a banana for breakfast. Encouraged increased PO intake to hopefully avoid feeding tube. She verbalized understanding.  ? ?Wt up 5 lbs. Decreased Milrinone 0.125 mcg/kg/min. Increased Lasix to 65m/hr. Co-ox 65.4.  ? ?Remains in NSR this morning. Received multiple Amio boluses over the weekend for atrial flutter. Amio 30 mg/hr.  ? ?WBC 30.7, afebrile this morning. Receiving Cefepime for positive urine culture. She remains on methimazole and PO Prednisone for hyperthyroidism. Will need endocrinology f/u appt at discharge.  ? ?Pt's husband is not at bedside this morning as he was here very early this morning with patient's anxiety. I spoke with him on the phone and discussed plan to begin VAD dressing teaching tomorrow morning. He verbalized understanding.  ? ?Vital signs: ?Temp: 98 ?HR: 100 ?Doppler Pressure: not documented ?Arterial BP: 95/80 (88) ?O2 Sat: 95% 2 L  ?Wt: 243.2>244.7>248  lbs ? ?LVAD interrogation:  ?Speed: 5300 ?Flow: 4.3 ?Power: 4.0 w ?PI: 4.6 ? ?Alarms: none ?Events: none ? ?Hematocrit: 25 ?Fixed speed: 5300 ?Low speed limit: 5000 ? ?Drive Line: Existing VAD dressing clean, dry, and intact.  Daily dressing changes using daily kit per VAD coordinator or nurse champion. Next dressing change due: 09/09/21.  ? ?Labs:  ?LDH trend: [id number]? ?INR trend: 0.8>1.6>2.9 ? ?WBC trend:  25.1>29.5>30.7 ? ?Hgb trend: 6.5>7.5>8.0>9.6 ? ?Anticoagulation Plan: ?-INR Goal: 2.0 - 2.5 ?-ASA Dose: 81 mg  ? ?Blood Products:  ?IntraOp: ? 4 PRBCs ? 4 FFP ? 2 Cryo ? 2 platelets ?DDAVP ? 1,400 cell saver ?Post Op: ?09/03/21: Factor 7 ? 1 PRBC ? 1 Platelet ?09/04/21: 1 PRBC ?09/05/21: 1 PRBC ? ?Device: ?-Medtronic  ?-Therapies: OFF ? 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 ?Respiratory: extubated 09/04/21  ? ?Infection: ?08/31/21>> blood cultures>> no growth 5 days; final ?09/04/21>> blood cultures>> no growth 5 days; final ?09/04/21>> sputum culture>> moderate WBC, no organisms seen; rare candida albicans ?09/04/21>> urine culture>> enterococcus faecalis ? ?Drips: ?Epinephrine 10 mcg/min-- off ?Levophed 8 mcg/min-- off ?Milrinone 0.125 mcg/kg/min ?Amiodarone 30 mg/hr ?Lasix 10 mg/hr ? ? ?Patient Education: ?Plan to begin drive line dressing and VAD discharge teach with pt and husband tomorrow. ?Discussed with bedside RN need to have patient practice changing power sources on their own.  ?PT/OT at bedside to ambulate and work with patient at this time ? ?Plan/Recommendations:  ?1. Page VAD coordinators with VAD equipment and drive line concerns. ?2. Daily drive line dressing change per VAD coordinator or nurse champion using daily kit ? ?AEmerson MonteRN ?VAD Coordinator  ?Office: [phone] ?24/7 Pager: [phone] ? ?  ?

## 2021-09-09 NOTE — Progress Notes (Signed)
HeartMate 3 Rounding Note ? ?Postop day  #6 HeartMate 3 implantation March 14 ? ?Subjective:   ? ?No more a-flutter, maintaining nsr ?Up in chair this am am ready to walk, still feels edematous, lasix dose increased ?CXR clear- chest tube output low- will DC ?INR 2.9 with a single dose of coumadin postop [2 mg] ?Leave EPW in place today ? ?LVAD INTERROGATION:  ?HeartMate 3LVAD:  Flow 4.2 liters/min, speed 5400, power 4.3 PI 2.5.  Controller  intact.  ? ?Objective:   ? ?Vital Signs:   ?Temp:  [97.7 ?F (36.5 ?C)-98.6 ?F (37 ?C)] 98 ?F (36.7 ?C) (03/20 0744) ?Pulse Rate:  [100] 100 (03/20 0800) ?Resp:  [19-35] 31 (03/20 0800) ?SpO2:  [98 %] 98 % (03/20 0800) ?Arterial Line BP: (77-116)/(66-92) 81/70 (03/20 0800) ?Weight:  [112.5 kg] 112.5 kg (03/20 0600) ?Last BM Date : 09/09/21 ?Mean arterial Pressure 80 ? ?Intake/Output:  ? ?Intake/Output Summary (Last 24 hours) at 09/09/2021 0901 ?Last data filed at 09/09/2021 0800 ?Gross per 24 hour  ?Intake 3307.77 ml  ?Output 3695 ml  ?Net -387.23 ml  ?  ? ?Physical Exam: ?General:  Well appearing. No resp difficulty, mild tachypnea. Sternal incision dry ?HEENT: normal ?Neck: supple. JVP . Carotids 2+ bilat; no bruits. No lymphadenopathy or thryomegaly appreciated. L neck drain at Impella 5.5 out and incision healing ?Cor: Mechanical heart sounds with LVAD hum present. ?Lungs: clear ?Abdomen: soft, nontender, nondistended. No hepatosplenomegaly. No bruits or masses. Good bowel sounds. ?Extremities: no cyanosis, clubbing, rash, edema ?Neuro: alert & orientedx3, cranial nerves grossly intact. moves all 4 extremities w/o difficulty. Affect pleasant ? ?Telemetry: sinus tach ? ?Labs: ?Basic Metabolic Panel: ?Recent Labs  ?Lab 09/05/21 ?0400 09/05/21 ?1535 09/06/21 ?0355 09/06/21 ?0415 09/07/21 ?0147 09/07/21 ?8937 09/07/21 ?2003 09/08/21 ?0141 09/08/21 ?0406 09/08/21 ?0850 09/08/21 ?1641 09/08/21 ?1921 09/09/21 ?3428  ?NA 133*   < > 130*   < > 132*   < >  --  131* 135 134* 130* 133* 135   ?K 3.7   < > 3.8   < > 3.2*   < >  --  2.6* 3.0* 3.3* 5.9* 3.7 3.6  ?CL 98   < > 99   < > 98   < >  --  95*  --  99 96* 99 99  ?CO2 24   < > 24   < > 24   < >  --  24  --  '23 25 25 26  '$ ?GLUCOSE 121*   < > 105*   < > 86   < >  --  101*  --  130* 230* 112* 99  ?BUN 27*   < > 30*   < > 32*   < >  --  32*  --  31* 30* 34* 33*  ?CREATININE 1.17*   < > 0.94   < > 0.97   < >  --  0.84  --  1.04* 0.93 0.94 0.86  ?CALCIUM 8.6*   < > 8.6*   < > 8.8*   < >  --  8.8*  --  9.0 8.7* 9.2 9.2  ?MG 2.2  --  2.4  --  2.1  --  1.9 2.1  --   --   --   --  1.7  ?PHOS 4.1  --  3.2  --  2.5  --   --  2.3*  --   --   --   --  2.8  ? < > =  values in this interval not displayed.  ? ? ?Liver Function Tests: ?Recent Labs  ?Lab 09/04/21 ?0311 09/05/21 ?0400 09/06/21 ?0355  ?AST 92* 85* 53*  ?ALT '31 27 25  '$ ?ALKPHOS 37* 44 60  ?BILITOT 1.5* 2.1* 1.6*  ?PROT 4.4* 4.8* 5.0*  ?ALBUMIN 3.0* 2.8* 2.5*  ? ?No results for input(s): LIPASE, AMYLASE in the last 168 hours. ?No results for input(s): AMMONIA in the last 168 hours. ? ?CBC: ?Recent Labs  ?Lab 09/06/21 ?0355 09/06/21 ?0415 09/06/21 ?1738 09/07/21 ?0147 09/07/21 ?9326 09/08/21 ?0406 09/08/21 ?0408 09/09/21 ?7124  ?WBC 30.8*  --  26.6* 23.9*  --   --  25.1* 30.7*  ?NEUTROABS 23.8*  --  22.1* 19.8*  --   --  22.1* 25.0*  ?HGB 8.8*   < > 8.8* 8.4* 8.5* 9.9* 9.5* 9.6*  ?HCT 26.3*   < > 26.9* 25.6* 25.0* 29.0* 28.9* 29.9*  ?MCV 87.7  --  88.8 88.6  --   --  87.8 89.0  ?PLT 106*  --  116* 111*  --   --  176 240  ? < > = values in this interval not displayed.  ? ? ?INR: ?Recent Labs  ?Lab 09/05/21 ?0400 09/06/21 ?0355 09/07/21 ?0147 09/08/21 ?0408 09/09/21 ?5809  ?INR 1.6* 2.4* 3.3* 2.5* 2.9*  ? ? ?Other results: ?EKG:  ? ?Imaging: ?DG Chest Port 1 View ? ?Result Date: 09/09/2021 ?CLINICAL DATA:  History of hypertension, MI, cardiomyopathy. Defibrillator placement and recent LVAD placement EXAM: PORTABLE CHEST 1 VIEW COMPARISON:  Chest radiograph 09/08/2021 FINDINGS: The left chest wall cardiac device  and associated leads, median sternotomy wires, and LVAD are stable. The right upper extremity PICC terminating in the right atrium is stable. The heart is enlarged, unchanged. The upper mediastinal contours are stable. Lung volumes are low. Is no focal consolidation. There is no definite overt pulmonary edema. There is no significant pleural effusion, though the left costophrenic angle is partially obscured. There is no appreciable pneumothorax. There is no acute osseous abnormality. IMPRESSION: Low lung volumes. No new or worsening focal airspace disease. Overall, no significant interval change since 09/08/2021. Electronically Signed   By: Valetta Mole M.D.   On: 09/09/2021 07:02  ? ?DG Chest Port 1 View ? ?Result Date: 09/08/2021 ?CLINICAL DATA:  Shortness of breath.  Emesis and palpitations. EXAM: PORTABLE CHEST 1 VIEW COMPARISON:  AP chest 09/07/2021 FINDINGS: Patient is rightward rotated. Status post median sternotomy. LVAD is in stable position. Left chest wall cardiac AICD with leads unchanged on frontal view. Right upper extremity PICC tip overlies the superior aspect of the right atrium unchanged. Moderately decreased lung volumes. Markedly enlarged cardiac silhouette unchanged. Mild bibasilar bronchovascular crowding. No definite pleural effusion or pneumothorax. No acute skeletal abnormality. IMPRESSION: Limited evaluation due to low lung volumes, AP technique, and patient rightward rotation. Bibasilar bronchovascular crowding. No definite acute lung process. LVAD stable in position. Electronically Signed   By: Yvonne Kendall M.D.   On: 09/08/2021 09:44   ? ? ?Medications:   ? ? ?Scheduled Medications: ? (feeding supplement) PROSource Plus  60 mL Oral TID BM  ? sodium chloride   Intravenous Once  ? sodium chloride   Intravenous Once  ? aspirin EC  81 mg Oral Daily  ? bisacodyl  10 mg Oral Daily  ? Chlorhexidine Gluconate Cloth  6 each Topical Daily  ? Chlorhexidine Gluconate Cloth  6 each Topical Daily  ?  feeding supplement  1 Container Oral BID BM  ? insulin  aspart  0-24 Units Subcutaneous TID WC & HS  ? methimazole  10 mg Oral Daily  ? methimazole  5 mg Oral QPM  ? pantoprazole  40 mg Oral Daily  ? polyethylene glycol  17 g Oral Daily  ? potassium chloride  40 mEq Oral Q4H  ? predniSONE  20 mg Oral Q breakfast  ? quiniDINE gluconate  324 mg Oral BID  ? rosuvastatin  40 mg Oral QHS  ? senna-docusate  2 tablet Oral BID  ? sildenafil  20 mg Oral TID  ? sodium chloride flush  10-40 mL Intracatheter Q12H  ? spironolactone  25 mg Oral Daily  ? Warfarin - Physician Dosing Inpatient   Does not apply q1600  ? ? ?Infusions: ? sodium chloride Stopped (09/05/21 1205)  ? sodium chloride    ? sodium chloride Stopped (09/09/21 0758)  ? sodium chloride    ? amiodarone 60 mg/hr (09/09/21 0800)  ? ceFEPime (MAXIPIME) IV Stopped (09/09/21 0557)  ? furosemide (LASIX) 200 mg in dextrose 5% 100 mL ('2mg'$ /mL) infusion 10 mg/hr (09/09/21 0800)  ? lactated ringers    ? lactated ringers Stopped (09/05/21 1010)  ? magnesium sulfate bolus IVPB    ? milrinone 0.125 mcg/kg/min (09/09/21 0800)  ? ? ?PRN Medications: ?sodium chloride, Place/Maintain arterial line **AND** sodium chloride, ALPRAZolam, midazolam, morphine injection, ondansetron (ZOFRAN) IV, oxyCODONE, sodium chloride flush, sodium chloride flush, sodium chloride flush, traMADol ? ? ?Assessment:  ?History of nonischemic cardiomyopathy with recent readmission for worsening heart failure and cardiogenic shock ? ?Preoperative bridge with Impella 5.5 LVAD to stabilize hemodynamics and improve organ function. ? ?Patient developed significant leukocytosis without fever and with negative cultures prior to implantation after steroid therapy initiated for hyperthyroid condition. ? ?Implantation of HeartMate 3 and removal of Impella 5.5 on September 03, 2021-perioperative coagulopathy and thrombocytopenia ? ? ?Plan/Discussion:   ?Patient progressing slowly with issues of fluid retention and surgical  pain.  Now diuresing and chest x-ray with minimal interstitial edema. Getting stronger ? ?Pump parameters remain satisfactory with increased pulsatility today due to volume shift to the intravascular

## 2021-09-09 NOTE — Progress Notes (Addendum)
Nutrition Follow-up ? ?DOCUMENTATION CODES:  ? ?Obesity unspecified, Non-severe (moderate) malnutrition in context of chronic illness ? ?INTERVENTION:  ? ?Boost Breeze po BID, each supplement provides 250 kcal and 9 grams of protein ? ?ProSource Plus to 60 mL TID, each supplement provides 100 kcals and 15 grams protein. Total of 600 kcals, 90 g of protein. Pt tcan take with Lemonade per pt request ?  ?Encourage smaller, more frequent meals. Husband and son to bring in snacks such as walnuts for pt.  ?  ?Continue with MVI with Minerals daily ? ?Continue REGULAR diet-given poor appetite with poor po intake and malnutrition, hoping to optimize intake to hopefully avoid Cortrak as pt does not want a Cortrak; monitor fluid intake ? ?Again recommend considering Cortrak insertion if po intake does not improve.  ? ? ?NUTRITION DIAGNOSIS:  ? ?Moderate Malnutrition related to chronic illness as evidenced by moderate muscle depletion, mild fat depletion, energy intake < 75% for > or equal to 1 month. ? ?Being addressed via supplements, lib diet ? ?GOAL:  ? ?Patient will meet greater than or equal to 90% of their needs ? ?Being addressed ? ?MONITOR:  ? ?PO intake, Supplement acceptance, Labs, Weight trends ? ?REASON FOR ASSESSMENT:  ? ?Consult ?LVAD Eval ? ?ASSESSMENT:  ? ?51 yo female admitted with acute on chronic CHF. PMH includes NICM EF 20-25%, CKD 3, CVA, HTN, HLD ? ? ?3/08 RHC: low cardiac output with elevated right/left filling pressure ?3/09 s/p Impella 5.5 placement ?3/10 Cortrak placed (tip in distal stomach) ?3/14 LVAD placed, Cortrak came out during surgery ?3/15 Extubated ? ?Pt reports she "feels tight" today. Early satiety continues. Best way to optimize po intake with early satiety is small, very frequent meals/snacks.  ? ?Recorded po intake over the weekend 0-80%; 25% of meals over the 2 days ?Noted Boost Breeze ordered today; pt reporting she drank one and ate Banana for Breakfast this AM as well. Working on  [employer] with Lemonade ? ?Limited documentation for calorie count:  ?3/17 ?Dinner: 15% of balsamic chicken, 15% of pinto beans ?3/18 ?Breakfast: 75% raisin bran and 1.5 cartons of milk  ?Lunch: 1/4 cheeseburger with lettuce, tomato, mayo; few chips and 1 can sprite ?Afternoon snack: few walnuts ?Dinner: 1/2 [place] Sandwich with Mayo, lettuce, tomato, onion; 1/2 chocolate ice cream, some sprite ?4 Pro-Sources with 2 Lemonades ? ?No tickets from 3/19  ? ?Constipation resolved; +BM today ? ?Labs: sodium wdl, potassium wdl, Creatinine wdl ?Meds: lasix gtt, ss novolog ? ? ?Diet Order:   ?Diet Order   ? ?       ?  Diet regular Room service appropriate? Yes; Fluid consistency: Thin  Diet effective now       ?  ? ?  ?  ? ?  ? ? ?EDUCATION NEEDS:  ? ?Education needs have been addressed ? ?Skin:  Skin Assessment: Reviewed RN Assessment (closed incision to chest) ? ?Last BM:  3/20 ? ?Height:  ? ?Ht Readings from Last 1 Encounters:  ?08/29/21 '5\' 3"'$  (1.6 m)  ? ? ?Weight:  ? ?Wt Readings from Last 1 Encounters:  ?09/09/21 112.5 kg  ? ? ? ?BMI:  Body mass index is 43.93 kg/m?. ? ?Estimated Nutritional Needs:  ? ?Kcal:  1800-2000 kcals ? ?Protein:  100-115 g ? ?Fluid:  2L ? ? ? ?Kerman Passey MS, RDN, LDN, CNSC ?Registered Dietitian III ?Clinical Nutrition ?RD Pager and On-Call Pager Number Located in [place]  ? ?

## 2021-09-09 NOTE — Progress Notes (Signed)
Physical Therapy Treatment ?Patient Details ?Name: [name] ?MRN: [id number] ?DOB: [birth year] ?Today's Date: 09/09/2021 ? ? ?History of Present Illness 51yo female adm 3/6 with acute on chronic heart failure exacerbation. Impella placed  3/9. LVAD HM 3 implanted 09/03/21. 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 ? ?  ?PT Comments  ? ? Patient progressing well towards PT goals. Session focused on gait training with use of EVA and Min A for support/safety as well as chair follow. Worked on functional transfers while adhering to sternal precautions requiring Mod A of 2. Instructed pt on how to begin switching self to/from battery requiring mod A due to swelling and weakness in hands. Reports feeling anxious and fatigued from mobility but motivated to mobilize. VSS on 02. Will continue to follow and progress as tolerated. ?  ?Recommendations for follow up therapy are one component of a multi-disciplinary discharge planning process, led by the attending physician.  Recommendations may be updated based on patient status, additional functional criteria and insurance authorization. ? ?Follow Up Recommendations ? Acute inpatient rehab (3hours/day) ?  ?  ?Assistance Recommended at Discharge Frequent or constant Supervision/Assistance  ?Patient can return home with the following A lot of help with walking and/or transfers;A lot of help with bathing/dressing/bathroom;Assistance with cooking/housework;Direct supervision/assist for financial management ?  ?Equipment Recommendations ? Rollator (4 wheels);BSC/3in1  ?  ?Recommendations for Other Services   ? ? ?  ?Precautions / Restrictions Precautions ?Precautions: Fall;Other (comment);Sternal ?Precaution Booklet Issued: No ?Precaution Comments: LVAD, 1 chest tube, edema ?Restrictions ?Weight Bearing Restrictions: Yes ?Other Position/Activity Restrictions: sternal precautions  ?  ? ?Mobility ? Bed Mobility ?Overal bed mobility: Needs Assistance ?Bed  Mobility: Sit to Sidelying ?  ?  ?  ?  ?Sit to sidelying: Mod assist, HOB elevated, +2 for physical assistance ?General bed mobility comments: Assist to bring LEs into bed to return to supine. ?  ? ?Transfers ?Overall transfer level: Needs assistance ?Equipment used:  (EVA) ?Transfers: Sit to/from Stand ?Sit to Stand: Mod assist, +2 physical assistance ?  ?  ?  ?  ?  ?General transfer comment: Mod A of 2 to power to standing from chair x3. Cues for technique and hands on knees. ?  ? ?Ambulation/Gait ?Ambulation/Gait assistance: Min assist (+3 people for chair follow/lines) ?Gait Distance (Feet): 15 Feet ?Assistive device: Ethelene Hal ?Gait Pattern/deviations: Step-through pattern, Decreased stride length ?Gait velocity: decreased ?  ?  ?General Gait Details: SLow, mildly unsteady gait with close chair follow; anxiety and fatigue limiting distance. 2/4 DOE. VSS on 02. ? ? ?Stairs ?  ?  ?  ?  ?  ? ? ?Wheelchair Mobility ?  ? ?Modified Rankin (Stroke Patients Only) ?  ? ? ?  ?Balance Overall balance assessment: Needs assistance ?Sitting-balance support: Feet supported, No upper extremity supported ?Sitting balance-Leahy Scale: Good ?  ?  ?Standing balance support: During functional activity ?Standing balance-Leahy Scale: Poor ?Standing balance comment: Requires UE spport on EVA walker. Limited standing tolerance due to fatigue. ?  ?  ?  ?  ?  ?  ?  ?  ?  ?  ?  ?  ? ?  ?Cognition Arousal/Alertness: Awake/alert ?Behavior During Therapy: Anxious ?Overall Cognitive Status: Impaired/Different from baseline ?Area of Impairment: Memory ?  ?  ?  ?  ?  ?  ?  ?  ?  ?  ?Memory: Decreased recall of precautions ?  ?  ?  ?  ?General Comments: Seems appropriate  today, attempting to switch self over to batteries. Reports feeling anxious. Needs reminders about precautions. ?  ?  ? ?  ?Exercises   ? ?  ?General Comments General comments (skin integrity, edema, etc.): Instructed pt on switching self to/from batteries, requires mod A.  Continues to have edema in BLEs/feet and hands. ?  ?  ? ?Pertinent Vitals/Pain Pain Assessment ?Pain Assessment: 0-10 ?Pain Score: 7  ?Pain Location: chest at incsion ?Pain Descriptors / Indicators: Aching, Guarding, Sore ?Pain Intervention(s): Monitored during session, Repositioned  ? ? ?Home Living   ?  ?  ?  ?  ?  ?  ?  ?  ?  ?   ?  ?Prior Function    ?  ?  ?   ? ?PT Goals (current goals can now be found in the care plan section) Progress towards PT goals: Progressing toward goals ? ?  ?Frequency ? ? ? Min 4X/week ? ? ? ?  ?PT Plan Current plan remains appropriate  ? ? ?Co-evaluation   ?  ?  ?  ?  ? ?  ?AM-PAC PT "6 Clicks" Mobility   ?Outcome Measure ? Help needed turning from your back to your side while in a flat bed without using bedrails?: A Lot ?Help needed moving from lying on your back to sitting on the side of a flat bed without using bedrails?: A Lot ?Help needed moving to and from a bed to a chair (including a wheelchair)?: Total ?Help needed standing up from a chair using your arms (e.g., wheelchair or bedside chair)?: Total ?Help needed to walk in hospital room?: Total ?Help needed climbing 3-5 steps with a railing? : Total ?6 Click Score: 8 ? ?  ?End of Session Equipment Utilized During Treatment: Oxygen ?Activity Tolerance: Patient limited by fatigue;Patient tolerated treatment well ?Patient left: in bed;with call [place]/phone within reach;with nursing/sitter in room ?Nurse Communication: Mobility status ?PT Visit Diagnosis: Other abnormalities of gait and mobility (R26.89);Muscle weakness (generalized) (M62.81) ?  ? ? ?Time: 1001-1035 ?PT Time Calculation (min) (ACUTE ONLY): 34 min ? ?Charges:  $Gait Training: 8-22 mins ?$Therapeutic Activity: 8-22 mins          ?          ? ?Marisa Severin, PT, DPT ?Acute Rehabilitation Services ?Pager [phone] ?Office [phone] ? ? ? ? ? ?[place] ?09/09/2021, 12:49 PM ? ?

## 2021-09-09 NOTE — Progress Notes (Signed)
Patient back into a fib with a rate in the 130s. [facility] PA notified. Orders received for 150 mg amiodarone bolus. ?

## 2021-09-09 NOTE — Progress Notes (Signed)
CSW met with patient and husband at bedside. Patient shared that she is back in Afib again and feeling a bit exhausted. CSW spoke with husband who shared that he too is tired and feeling a bit down with the reoccurrence of Afib and the continued fluid. He shared "I feel like we are no further ahead than before the surgery". CSW provided supportive intervention and spoke with Ellen Henri, PA about patient's husband's concerns. [place] spoke with patient and husband at bedside and answered questions and shared current plan for patient. Patient's husband appeared to be relieved after conversation but stated he will ultimately feel better when she is improved. CSW will continue to follow for supportive intervention throughout implant hospitalization. Raquel Sarna, [place], [place] ? ?

## 2021-09-09 NOTE — Progress Notes (Addendum)
Pt back in rapid AF, 130s. MAPs ok 70s ? ?Diuresing on lasix gtt. K on f/u BMP 3.8. SCr stable, 0.91 ?Received 3 g Mg supp this am (1.7)  ? ?Rebolus w/ IV amio. Continue gtt at 60/hr ? ?Give additional 40 mEq KCl.  ? ?Add digoxin 0.0625 mg to help w/ rate control. Will discuss discontinuation of milrinone w/ Dr. [name] (Co-ox 61%)  ? ?Lyda Jester, PA-C  ? ? ?Addendum: D/w Dr. [name], will stop Milrinone. Hopefully this will help w/ AF. Recheck Mg, supp if < 2.0  ?F/u Co-ox scheduled for tomorrow AM.  ?

## 2021-09-09 NOTE — Progress Notes (Addendum)
HeartMate 2 Rounding Note ? ?Subjective:   ? ?- 3/14: HM3 LVAD placement with removal of Impella 5.5  ?- 3/15: Speed increased to 5300 rpm. Given 1UPRBC. Febrile. Cultures obtained. Extubated.  ?- 3/16 VT paced out. On amio drip. Given 1UPRBCs . Given couple doses of IV lasix.  ?- 3/18 Rapid ALF. Bolus ed multiple times with amio and drip turned up to 60. Now back in NSR ?- 3/19 VAD Speed increased to 5400 (high PI)  ? ?Remains on milrinone 0.25. Co-ox 65%. On Lasix gtt at 6/hr. 3.5L in UOP yesterday but slightly net + for the day. CVP 12-14 ? ?Sinus tach, low 100s. MAPs 70s ? ?Scr stable, 0.82 ?K 3.6 ?Mg 1.7  ? ?INR 2.9  ? ?LDH 498>>519  ? ?Remains on cefepime WBC 24K -> 25->>30K. No fevers. On po prednisone.  ? ?Feels slightly more SOB today. No other complaints. Denies fever/chills.  ? ? ?LVAD INTERROGATION:  ?HeartMate II LVAD:  Flow 4.4  liters/min, speed 5400, power 3.9, PI 3.2  VAD interrogated personally. Parameters stable. ? ? ?Objective:   ? ?Vital Signs:   ?Temp:  [97.7 ?F (36.5 ?C)-98.6 ?F (37 ?C)] 98.6 ?F (37 ?C) (03/20 0000) ?Resp:  [19-35] 28 (03/20 0600) ?Arterial Line BP: (91-116)/(69-92) 98/77 (03/20 0600) ?Weight:  [112.5 kg] 112.5 kg (03/20 0600) ?Last BM Date : 09/08/21 ?Mean arterial Pressure 70s-80s ? ?Intake/Output:  ? ?Intake/Output Summary (Last 24 hours) at 09/09/2021 0709 ?Last data filed at 09/09/2021 0600 ?Gross per 24 hour  ?Intake 3700.22 ml  ?Output 3475 ml  ?Net 225.22 ml  ?  ?Physical exam ?CVP 12-14 ?General:  fatigued appearing. Sitting up in chair.   ?HEENT: normal  ?Neck: supple. JVD 12-14 cm  Carotids 2+ bilat; no bruits. No lymphadenopathy or thryomegaly appreciated. ?Cor: LVAD hum. Tachy rhythm  ?Lungs: Clear. No wheezing  ?Abdomen: obese soft, nontender, non-distended. No hepatosplenomegaly. No bruits or masses. Good bowel sounds. Driveline site clean. Anchor in place.  ?Extremities: no cyanosis, clubbing, rash. 2+ edema ?Neuro: alert & oriented x 3. No focal deficits. Moves  all 4 without problem  ? ? ?Telemetry: Sinus tach, 102 bpm. No further AFL. Personally reviewed ? ? ? ?Labs: ?Basic Metabolic Panel: ?Recent Labs  ?Lab 09/05/21 ?0400 09/05/21 ?1535 09/06/21 ?0355 09/06/21 ?0415 09/07/21 ?0147 09/07/21 ?6979 09/07/21 ?2003 09/08/21 ?0141 09/08/21 ?0406 09/08/21 ?0850 09/08/21 ?1641 09/08/21 ?1921 09/09/21 ?4801  ?NA 133*   < > 130*   < > 132*   < >  --  131* 135 134* 130* 133* 135  ?K 3.7   < > 3.8   < > 3.2*   < >  --  2.6* 3.0* 3.3* 5.9* 3.7 3.6  ?CL 98   < > 99   < > 98   < >  --  95*  --  99 96* 99 99  ?CO2 24   < > 24   < > 24   < >  --  24  --  '23 25 25 26  '$ ?GLUCOSE 121*   < > 105*   < > 86   < >  --  101*  --  130* 230* 112* 99  ?BUN 27*   < > 30*   < > 32*   < >  --  32*  --  31* 30* 34* 33*  ?CREATININE 1.17*   < > 0.94   < > 0.97   < >  --  0.84  --  1.04*  0.93 0.94 0.86  ?CALCIUM 8.6*   < > 8.6*   < > 8.8*   < >  --  8.8*  --  9.0 8.7* 9.2 9.2  ?MG 2.2  --  2.4  --  2.1  --  1.9 2.1  --   --   --   --  1.7  ?PHOS 4.1  --  3.2  --  2.5  --   --  2.3*  --   --   --   --  2.8  ? < > = values in this interval not displayed.  ? ? ?Liver Function Tests: ?Recent Labs  ?Lab 09/04/21 ?0311 09/05/21 ?0400 09/06/21 ?0355  ?AST 92* 85* 53*  ?ALT '31 27 25  '$ ?ALKPHOS 37* 44 60  ?BILITOT 1.5* 2.1* 1.6*  ?PROT 4.4* 4.8* 5.0*  ?ALBUMIN 3.0* 2.8* 2.5*  ? ?No results for input(s): LIPASE, AMYLASE in the last 168 hours. ?No results for input(s): AMMONIA in the last 168 hours. ? ?CBC: ?Recent Labs  ?Lab 09/06/21 ?0355 09/06/21 ?0415 09/06/21 ?1738 09/07/21 ?0147 09/07/21 ?1540 09/08/21 ?0406 09/08/21 ?0408 09/09/21 ?0867  ?WBC 30.8*  --  26.6* 23.9*  --   --  25.1* 30.7*  ?NEUTROABS 23.8*  --  22.1* 19.8*  --   --  22.1* 25.0*  ?HGB 8.8*   < > 8.8* 8.4* 8.5* 9.9* 9.5* 9.6*  ?HCT 26.3*   < > 26.9* 25.6* 25.0* 29.0* 28.9* 29.9*  ?MCV 87.7  --  88.8 88.6  --   --  87.8 89.0  ?PLT 106*  --  116* 111*  --   --  176 240  ? < > = values in this interval not displayed.  ? ? ?INR: ?Recent Labs  ?Lab  09/05/21 ?0400 09/06/21 ?0355 09/07/21 ?0147 09/08/21 ?0408 09/09/21 ?6195  ?INR 1.6* 2.4* 3.3* 2.5* 2.9*  ? ? ?Other results: ? ? ?Imaging: ?DG Chest Port 1 View ? ?Result Date: 09/09/2021 ?CLINICAL DATA:  History of hypertension, MI, cardiomyopathy. Defibrillator placement and recent LVAD placement EXAM: PORTABLE CHEST 1 VIEW COMPARISON:  Chest radiograph 09/08/2021 FINDINGS: The left chest wall cardiac device and associated leads, median sternotomy wires, and LVAD are stable. The right upper extremity PICC terminating in the right atrium is stable. The heart is enlarged, unchanged. The upper mediastinal contours are stable. Lung volumes are low. Is no focal consolidation. There is no definite overt pulmonary edema. There is no significant pleural effusion, though the left costophrenic angle is partially obscured. There is no appreciable pneumothorax. There is no acute osseous abnormality. IMPRESSION: Low lung volumes. No new or worsening focal airspace disease. Overall, no significant interval change since 09/08/2021. Electronically Signed   By: Valetta Mole M.D.   On: 09/09/2021 07:02  ? ?DG Chest Port 1 View ? ?Result Date: 09/08/2021 ?CLINICAL DATA:  Shortness of breath.  Emesis and palpitations. EXAM: PORTABLE CHEST 1 VIEW COMPARISON:  AP chest 09/07/2021 FINDINGS: Patient is rightward rotated. Status post median sternotomy. LVAD is in stable position. Left chest wall cardiac AICD with leads unchanged on frontal view. Right upper extremity PICC tip overlies the superior aspect of the right atrium unchanged. Moderately decreased lung volumes. Markedly enlarged cardiac silhouette unchanged. Mild bibasilar bronchovascular crowding. No definite pleural effusion or pneumothorax. No acute skeletal abnormality. IMPRESSION: Limited evaluation due to low lung volumes, AP technique, and patient rightward rotation. Bibasilar bronchovascular crowding. No definite acute lung process. LVAD stable in position. Electronically  Signed   By: Jori Moll  Viola M.D.   On: 09/08/2021 09:44   ? ? ?Medications:   ? ? ?Scheduled Medications: ? (feeding supplement) PROSource Plus  60 mL Oral TID BM  ? sodium chloride   Intravenous Once  ? sodium chloride   Intravenous Once  ? aspirin EC  81 mg Oral Daily  ? bisacodyl  10 mg Oral Daily  ? Chlorhexidine Gluconate Cloth  6 each Topical Daily  ? Chlorhexidine Gluconate Cloth  6 each Topical Daily  ? insulin aspart  0-24 Units Subcutaneous Q4H  ? methimazole  10 mg Oral Daily  ? methimazole  5 mg Oral QPM  ? pantoprazole  40 mg Oral Daily  ? polyethylene glycol  17 g Oral Daily  ? predniSONE  20 mg Oral Q breakfast  ? quiniDINE gluconate  324 mg Oral BID  ? rosuvastatin  40 mg Oral QHS  ? senna-docusate  2 tablet Oral BID  ? sodium chloride flush  10-40 mL Intracatheter Q12H  ? spironolactone  25 mg Oral Daily  ? Warfarin - Physician Dosing Inpatient   Does not apply q1600  ? ? ?Infusions: ? sodium chloride Stopped (09/05/21 1205)  ? sodium chloride    ? sodium chloride Stopped (09/09/21 0525)  ? sodium chloride    ? amiodarone 60 mg/hr (09/09/21 0548)  ? ceFEPime (MAXIPIME) IV Stopped (09/09/21 0557)  ? furosemide (LASIX) 200 mg in dextrose 5% 100 mL ('2mg'$ /mL) infusion 6 mg/hr (09/09/21 0600)  ? lactated ringers    ? lactated ringers Stopped (09/05/21 1010)  ? magnesium sulfate bolus IVPB 2 g (09/09/21 0610)  ? milrinone 0.25 mcg/kg/min (09/09/21 0600)  ? potassium chloride 10 mEq (09/09/21 8676)  ? ? ?PRN Medications: ?sodium chloride, Place/Maintain arterial line **AND** sodium chloride, midazolam, morphine injection, ondansetron (ZOFRAN) IV, oxyCODONE, sodium chloride flush, sodium chloride flush, sodium chloride flush, traMADol ? ? ? ?Plan/Discussion:   ? ? 1. Acute on chronic systolic HF: Nonischemic cardiomyopathy.  She has a Medtronic ICD. Cause for cardiomyopathy is uncertain, initially identified peri-partum (after son's birth), so cannot rule out peri-partum CMP.  On and off, she has had frequent  PVCs.   RHC in 8/15 showed normal filling pressures and preserved cardiac output. Echo in 4/22 with EF 25%, severe LV dilation.  CPX (8/22) with mild-moderate HF limitation.  Echo this admission with E

## 2021-09-09 NOTE — Progress Notes (Signed)
Inpatient Rehab Admissions Coordinator:  ? ?Per therapy recommendations,  patient was screened for CIR candidacy by Clemens Catholic, MS, CCC-SLP ?Marland Kitchen At this time, Pt. Appears to be a a potential candidate for CIR. I will reach out to  to medical team to see if they feel she is ready/appropriate for CIR and they will place consult if so. Please contact me any with questions. ? ?Clemens Catholic, MS, CCC-SLP ?Rehab Admissions Coordinator  ?[phone] (celll) ?[phone] (office) ? ?

## 2021-09-09 NOTE — Progress Notes (Signed)
EVENING ROUNDS NOTE : ? ?   ?[address] ?      [place] [address] ?            [phone]   ?              ?6 Days Post-Op ?Procedure(s) (LRB): ?INSERTION OF IMPLANTABLE LEFT VENTRICULAR ASSIST DEVICE/ Heartmate 3 (N/A) ?TRANSESOPHAGEAL ECHOCARDIOGRAM (TEE) (N/A) ?REMOVAL OF IMPELLA LEFT VENTRICULAR ASSIST DEVICE (N/A) ? ? ?Total Length of Stay:  LOS: 14 days  ?Events:   ?No events ?Resting comfortably in bed ? ? ? ?BP (!) 61/41   Pulse (!) 129   Temp 97.9 ?F (36.6 ?C) (Axillary)   Resp (!) 23   Ht '5\' 3"'$  (1.6 m)   Wt 112.5 kg   SpO2 96%   BMI 43.93 kg/m?  ? ?CVP:  [8 mmHg-14 mmHg] 14 mmHg ? ?  ? ? sodium chloride Stopped (09/05/21 1205)  ? sodium chloride    ? sodium chloride Stopped (09/09/21 0758)  ? sodium chloride    ? amiodarone 60 mg/hr (09/09/21 1700)  ? ceFEPime (MAXIPIME) IV Stopped (09/09/21 1348)  ? furosemide (LASIX) 200 mg in dextrose 5% 100 mL ('2mg'$ /mL) infusion 10 mg/hr (09/09/21 1700)  ? lactated ringers    ? lactated ringers Stopped (09/05/21 1010)  ? ? ?I/O last 3 completed shifts: ?In: 5464.9 [P.O.:2011; I.V.:1975.2; IV Piggyback:1478.6] ?Out: 7371 [Urine:5930; Chest Tube:390] ? ? ?CBC Latest Ref Rng & Units 09/09/2021 09/08/2021 09/08/2021  ?WBC 4.0 - 10.5 K/uL 30.7(H) 25.1(H) -  ?Hemoglobin 12.0 - 15.0 g/dL 9.6(L) 9.5(L) 9.9(L)  ?Hematocrit 36.0 - 46.0 % 29.9(L) 28.9(L) 29.0(L)  ?Platelets 150 - 400 K/uL 240 176 -  ? ? ?BMP Latest Ref Rng & Units 09/09/2021 09/09/2021 09/08/2021  ?Glucose 70 - 99 mg/dL 133(H) 99 112(H)  ?BUN 6 - 20 mg/dL 33(H) 33(H) 34(H)  ?Creatinine 0.44 - 1.00 mg/dL 0.91 0.86 0.94  ?BUN/Creat Ratio 9 - 23 - - -  ?Sodium 135 - 145 mmol/L 135 135 133(L)  ?Potassium 3.5 - 5.1 mmol/L 3.8 3.6 3.7  ?Chloride 98 - 111 mmol/L 98 99 99  ?CO2 22 - 32 mmol/L '26 26 25  '$ ?Calcium 8.9 - 10.3 mg/dL 9.2 9.2 9.2  ? ? ?ABG ?   ?Component Value Date/Time  ? PHART 7.508 (H) 09/08/2021 0406  ? PCO2ART 35.9 09/08/2021 0406  ? PO2ART 138 (H) 09/08/2021 0406  ? HCO3 28.5 (H)  09/08/2021 0406  ? TCO2 30 09/08/2021 0406  ? ACIDBASEDEF 1.0 09/03/2021 1818  ? O2SAT 61.4 09/09/2021 1130  ? ? ? ? ? ?Melodie Bouillon, MD ?09/09/2021 5:24 PM ? ? ?

## 2021-09-09 NOTE — Progress Notes (Signed)
Inpatient Rehabilitation Admissions Coordinator  ? ?I met at bedside with patient for assessment. We discussed goals and expectations of a possible CIR admit when medically ready. She is in agreement.I feel she would benefit from CIR admit prior to discharge home with her spouse. I will follow her progress to determine when to begin Auth with [employer]. ? ?Danne Baxter, RN, MSN ?Rehab Admissions Coordinator ?[phone] ?09/09/2021 1:27 PM ? ?

## 2021-09-10 ENCOUNTER — Inpatient Hospital Stay (HOSPITAL_COMMUNITY): Payer: BC Managed Care – PPO

## 2021-09-10 DIAGNOSIS — I5043 Acute on chronic combined systolic (congestive) and diastolic (congestive) heart failure: Secondary | ICD-10-CM

## 2021-09-10 DIAGNOSIS — Z95811 Presence of heart assist device: Secondary | ICD-10-CM | POA: Diagnosis not present

## 2021-09-10 DIAGNOSIS — I5023 Acute on chronic systolic (congestive) heart failure: Secondary | ICD-10-CM | POA: Diagnosis not present

## 2021-09-10 DIAGNOSIS — R570 Cardiogenic shock: Secondary | ICD-10-CM | POA: Diagnosis not present

## 2021-09-10 DIAGNOSIS — I428 Other cardiomyopathies: Secondary | ICD-10-CM | POA: Diagnosis not present

## 2021-09-10 LAB — CBC WITH DIFFERENTIAL/PLATELET
Abs Immature Granulocytes: 0.6 10*3/uL — ABNORMAL HIGH (ref 0.00–0.07)
Basophils Absolute: 0 10*3/uL (ref 0.0–0.1)
Basophils Relative: 0 %
Eosinophils Absolute: 0 10*3/uL (ref 0.0–0.5)
Eosinophils Relative: 0 %
HCT: 30.5 % — ABNORMAL LOW (ref 36.0–46.0)
Hemoglobin: 9.5 g/dL — ABNORMAL LOW (ref 12.0–15.0)
Lymphocytes Relative: 5 %
Lymphs Abs: 1.6 10*3/uL (ref 0.7–4.0)
MCH: 27.7 pg (ref 26.0–34.0)
MCHC: 31.1 g/dL (ref 30.0–36.0)
MCV: 88.9 fL (ref 80.0–100.0)
Metamyelocytes Relative: 1 %
Monocytes Absolute: 3.2 10*3/uL — ABNORMAL HIGH (ref 0.1–1.0)
Monocytes Relative: 10 %
Myelocytes: 1 %
Neutro Abs: 26.9 10*3/uL — ABNORMAL HIGH (ref 1.7–7.7)
Neutrophils Relative %: 83 %
Platelets: 343 10*3/uL (ref 150–400)
RBC: 3.43 MIL/uL — ABNORMAL LOW (ref 3.87–5.11)
RDW: 21.8 % — ABNORMAL HIGH (ref 11.5–15.5)
WBC: 32.4 10*3/uL — ABNORMAL HIGH (ref 4.0–10.5)
nRBC: 0 /100 WBC
nRBC: 0.5 % — ABNORMAL HIGH (ref 0.0–0.2)

## 2021-09-10 LAB — POCT I-STAT 7, (LYTES, BLD GAS, ICA,H+H)
Acid-Base Excess: 6 mmol/L — ABNORMAL HIGH (ref 0.0–2.0)
Bicarbonate: 30.1 mmol/L — ABNORMAL HIGH (ref 20.0–28.0)
Calcium, Ion: 1.26 mmol/L (ref 1.15–1.40)
HCT: 31 % — ABNORMAL LOW (ref 36.0–46.0)
Hemoglobin: 10.5 g/dL — ABNORMAL LOW (ref 12.0–15.0)
O2 Saturation: 97 %
Patient temperature: 37.1
Potassium: 3.6 mmol/L (ref 3.5–5.1)
Sodium: 134 mmol/L — ABNORMAL LOW (ref 135–145)
TCO2: 31 mmol/L (ref 22–32)
pCO2 arterial: 39.8 mmHg (ref 32–48)
pH, Arterial: 7.487 — ABNORMAL HIGH (ref 7.35–7.45)
pO2, Arterial: 86 mmHg (ref 83–108)

## 2021-09-10 LAB — BASIC METABOLIC PANEL
Anion gap: 12 (ref 5–15)
Anion gap: 13 (ref 5–15)
Anion gap: 11 (ref 5–15)
BUN: 36 mg/dL — ABNORMAL HIGH (ref 6–20)
BUN: 35 mg/dL — ABNORMAL HIGH (ref 6–20)
BUN: 34 mg/dL — ABNORMAL HIGH (ref 6–20)
CO2: 28 mmol/L (ref 22–32)
CO2: 25 mmol/L (ref 22–32)
CO2: 28 mmol/L (ref 22–32)
Calcium: 9.2 mg/dL (ref 8.9–10.3)
Calcium: 9 mg/dL (ref 8.9–10.3)
Calcium: 9.4 mg/dL (ref 8.9–10.3)
Chloride: 95 mmol/L — ABNORMAL LOW (ref 98–111)
Chloride: 96 mmol/L — ABNORMAL LOW (ref 98–111)
Chloride: 96 mmol/L — ABNORMAL LOW (ref 98–111)
Creatinine, Ser: 0.89 mg/dL (ref 0.44–1.00)
Creatinine, Ser: 0.93 mg/dL (ref 0.44–1.00)
Creatinine, Ser: 0.96 mg/dL (ref 0.44–1.00)
GFR, Estimated: >60 mL/min (ref >60)
GFR, Estimated: >60 mL/min (ref >60)
GFR, Estimated: >60 mL/min (ref >60)
Glucose, Bld: 107 mg/dL — ABNORMAL HIGH (ref 70–99)
Glucose, Bld: 114 mg/dL — ABNORMAL HIGH (ref 70–99)
Glucose, Bld: 120 mg/dL — ABNORMAL HIGH (ref 70–99)
Potassium: 3.1 mmol/L — ABNORMAL LOW (ref 3.5–5.1)
Potassium: 3.3 mmol/L — ABNORMAL LOW (ref 3.5–5.1)
Potassium: 3.6 mmol/L (ref 3.5–5.1)
Sodium: 135 mmol/L (ref 135–145)
Sodium: 134 mmol/L — ABNORMAL LOW (ref 135–145)
Sodium: 135 mmol/L (ref 135–145)

## 2021-09-10 LAB — COOXEMETRY PANEL
Carboxyhemoglobin: 2.1 % — ABNORMAL HIGH (ref 0.5–1.5)
Methemoglobin: 1.1 % (ref 0.0–1.5)
O2 Saturation: 66.1 %
Total hemoglobin: 9.1 g/dL — ABNORMAL LOW (ref 12.0–16.0)

## 2021-09-10 LAB — GLUCOSE, CAPILLARY
Glucose-Capillary: 114 mg/dL — ABNORMAL HIGH (ref 70–99)
Glucose-Capillary: 125 mg/dL — ABNORMAL HIGH (ref 70–99)
Glucose-Capillary: 146 mg/dL — ABNORMAL HIGH (ref 70–99)
Glucose-Capillary: 83 mg/dL (ref 70–99)
Glucose-Capillary: 99 mg/dL (ref 70–99)

## 2021-09-10 LAB — MAGNESIUM
Magnesium: 2 mg/dL (ref 1.7–2.4)
Magnesium: 1.8 mg/dL (ref 1.7–2.4)

## 2021-09-10 LAB — PROTIME-INR
INR: 2.2 — ABNORMAL HIGH (ref 0.8–1.2)
Prothrombin Time: 24.4 seconds — ABNORMAL HIGH (ref 11.4–15.2)

## 2021-09-10 LAB — LACTATE DEHYDROGENASE: LDH: 534 U/L — ABNORMAL HIGH (ref 98–192)

## 2021-09-10 LAB — PHOSPHORUS: Phosphorus: 3.8 mg/dL (ref 2.5–4.6)

## 2021-09-10 LAB — BRAIN NATRIURETIC PEPTIDE: B Natriuretic Peptide: 521.3 pg/mL — ABNORMAL HIGH (ref 0.0–100.0)

## 2021-09-10 MED ORDER — WARFARIN SODIUM 2 MG PO TABS
2.0000 mg | ORAL_TABLET | Freq: Once | ORAL | Status: AC
Start: 2021-09-10 — End: 2021-09-10
  Administered 2021-09-10: 2 mg via ORAL
  Filled 2021-09-10: qty 1

## 2021-09-10 MED ORDER — POTASSIUM CHLORIDE 10 MEQ/50ML IV SOLN
10.0000 meq | INTRAVENOUS | Status: AC
  Administered 2021-09-10 (×3): 10 meq via INTRAVENOUS
  Filled 2021-09-10 (×3): qty 50

## 2021-09-10 MED ORDER — POTASSIUM CHLORIDE CRYS ER 10 MEQ PO TBCR
40.0000 meq | EXTENDED_RELEASE_TABLET | Freq: Once | ORAL | Status: AC
  Administered 2021-09-10: 40 meq via ORAL
  Filled 2021-09-10: qty 4

## 2021-09-10 MED ORDER — POTASSIUM CHLORIDE CRYS ER 10 MEQ PO TBCR
40.0000 meq | EXTENDED_RELEASE_TABLET | Freq: Once | ORAL | Status: AC
Start: 2021-09-10 — End: 2021-09-10
  Administered 2021-09-10: 40 meq via ORAL
  Filled 2021-09-10: qty 4

## 2021-09-10 MED ORDER — AMIODARONE IV BOLUS ONLY 150 MG/100ML: 150.0000 mg | Freq: Once | INTRAVENOUS | Status: DC

## 2021-09-10 MED ORDER — AMIODARONE LOAD VIA INFUSION
150.0000 mg | Freq: Once | INTRAVENOUS | Status: AC
  Administered 2021-09-10: 150 mg via INTRAVENOUS
  Filled 2021-09-10: qty 83.34

## 2021-09-10 MED ORDER — POTASSIUM CHLORIDE 10 MEQ/50ML IV SOLN: 10.0000 meq | INTRAVENOUS | Status: DC

## 2021-09-10 MED ORDER — METOLAZONE 2.5 MG PO TABS
2.5000 mg | ORAL_TABLET | Freq: Once | ORAL | Status: AC
Start: 2021-09-10 — End: 2021-09-10
  Administered 2021-09-10: 2.5 mg via ORAL
  Filled 2021-09-10: qty 1

## 2021-09-10 MED ORDER — POTASSIUM CHLORIDE 10 MEQ/50ML IV SOLN
10.0000 meq | INTRAVENOUS | Status: AC
  Administered 2021-09-10 (×3): 10 meq via INTRAVENOUS
  Filled 2021-09-10 (×2): qty 50

## 2021-09-10 MED ORDER — POTASSIUM CHLORIDE 10 MEQ/50ML IV SOLN
10.0000 meq | INTRAVENOUS | Status: AC
  Administered 2021-09-10 (×4): 10 meq via INTRAVENOUS
  Filled 2021-09-10: qty 50

## 2021-09-10 MED ORDER — MAGNESIUM SULFATE 2 GM/50ML IV SOLN
2.0000 g | Freq: Once | INTRAVENOUS | Status: AC
  Administered 2021-09-10: 2 g via INTRAVENOUS
  Filled 2021-09-10: qty 50

## 2021-09-10 MED ORDER — METOLAZONE 2.5 MG PO TABS
2.5000 mg | ORAL_TABLET | Freq: Once | ORAL | Status: AC
  Administered 2021-09-10: 2.5 mg via ORAL
  Filled 2021-09-10: qty 1

## 2021-09-10 NOTE — Progress Notes (Addendum)
LVAD Coordinator Rounding Note: ? ?Admitted 08/26/21 due to acute on chronic systolic heart failure. Receiving ongoing treatment for hyperthyroidism with Methimazole and steroids. On 08/29/21 Impella 5.5 placed.  ? ?HM III LVAD implanted on 09/03/21 by Dr Prescott Gum under destination therapy criteria due to elevated BMI.  ? ?Pt sitting up in the recliner upon my arrival. Husband is at bedside. Pt is conversationally short of breath this morning. Wt is down 6 lbs. CVP 13. Lasix 10 mg/hr with good output noted. Chest xray obtained this morning.  ? ?Pt in/out of atrial flutter yesterday afternoon. Received Amiodarone bolus. Converted around 11 pm. Had another episode of atrial flutter this morning, but is currently in NSR. Milrinone stopped with frequent arrhthymias. Co-ox 66%.  ? ?WBC 32.5, afebrile. Receiving Cefepime for positive urine culture. She remains on methimazole and PO Prednisone for hyperthyroidism. Will need endocrinology f/u appt at discharge.  ? ?Per bedside RN pt only able to walk short distance (out into the hallway to the desk by her room) before she becomes anxious and flustered and has to go back to her room. Discussed following pt with recliner to allow her rest breaks as needed to see if we can encourage longer walking distances in the hall. PT/OT coming to work with pt later today.  ? ?Ramp echo scheduled 09/11/21 at 1 pm.  ? ?See pt and caregiver education documented below.  ? ?Vital signs: ?Temp: 97.8 ?HR: 106 ST ?Doppler Pressure: not documented ?Arterial BP: 92/76 (84) ?O2 Sat: 97% 2 L  ?Wt: 243.2>244.7>248>242.6  lbs ? ?LVAD interrogation:  ?Speed: 5400 ?Flow: 4.6 ?Power: 4.0 w ?PI: 3.7 ? ?Alarms: none ?Events: none ? ?Hematocrit: 31 ?Fixed speed: 5300 ?Low speed limit: 5000 ? ?Drive Line: Existing VAD dressing removed and site care performed using sterile technique. Drive line exit site cleaned with Chlora prep applicators x 2, allowed to dry, and gauze dressing with silver strip applied. Exit  site not incorporated, the velour is fully implanted at exit site. 1 suture in place. No redness, drainage, tenderness, foul odor or rash noted. Slight bruising noted above exit site. Drive line anchor re-applied. Daily dressing changes using daily kit per VAD coordinator or nurse champion. Next dressing change due: 09/11/21.  ? ? ?Labs:  ?LDH trend: [phone] ? ?INR trend: 0.8>1.6>2.9>2.2 ? ?WBC trend: 25.1>29.5>30.7>32.4 ? ?Hgb trend: 6.5>7.5>8.0>9.6>9.5 ? ?Anticoagulation Plan: ?-INR Goal: 2.0 - 2.5 ?-ASA Dose: 81 mg  ? ?Blood Products:  ?IntraOp: ? 4 PRBCs ? 4 FFP ? 2 Cryo ? 2 platelets ?DDAVP ? 1,400 cell saver ?Post Op: ?09/03/21: Factor 7 ? 1 PRBC ? 1 Platelet ?09/04/21: 1 PRBC ?09/05/21: 1 PRBC ? ?Device: ?-Medtronic  ?-Therapies: OFF ? 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 ?Respiratory: extubated 09/04/21  ? ?Infection: ?08/31/21>> blood cultures>> no growth 5 days; final ?09/04/21>> blood cultures>> no growth 5 days; final ?09/04/21>> sputum culture>> moderate WBC, no organisms seen; rare candida albicans ?09/04/21>> urine culture>> enterococcus faecalis; final ? ?Drips: ?Milrinone 0.125 mcg/kg/min-- off ?Amiodarone 60 mg/hr ?Lasix 10 mg/hr ? ?Patient Education: ?Encouraged pt and husband [name]) to review HeartMate III Handbook ?Pt discharge binder delivered to beside. Encourage pt and [name] to review. ?Pt verbalized and demonstrated self-test ?Reviewed controller functions, buttons, how to review previous alarms, and pump running symbol ?Briefly reviewed controller back up battery function ?Discussed modular cable connection. The two cables are connected    ?      by aligning the triangles, applying firm force to engage  the cables and  ?      rotating the locking nut until the clicking sound stops and the yellow   ?      line is no longer visible. Keep the connection dry. ?Briefly discussed batteries & clips, and importance of charging/rotating batteries.  ?Discussed Charity fundraiser  functions. Discussed need to calibrate batteries every 70 uses. ?Pt and husband [name] successfully demonstrated placing batteries in/out of clips. Demonstrated how to check battery life on batteries and controller.  ?Both patient and [name] successfully demonstrated power source change. ?Discussed importance of black bag (and components in bag- extra clips, batteries, and backup controller ? Discussed drive line care including not pulling, tugging on DL and keeping secure in anchor at all times.  ?Discussed importance of knowing where equipment is at all times, and proper techniques of how to carry equipment. Neck strap placed on controller for pt to wear.  ? ?Plan/Recommendations:  ?1. Page VAD coordinators with VAD equipment and drive line concerns. ?2. Daily drive line dressing change per VAD coordinator or nurse champion using daily kit. ? ?[name] RN ?VAD Coordinator  ?Office: [phone]  ?24/7 Pager: [phone]  ? ?  ?

## 2021-09-10 NOTE — Progress Notes (Signed)
Physical Therapy Treatment ?Patient Details ?Name: [name] ?MRN: [id number] ?DOB: [birth year] ?Today's Date: 09/10/2021 ? ? ?History of Present Illness 51yo female adm 3/6 with acute on chronic heart failure exacerbation. Impella placed  3/9. LVAD HM 3 implanted 09/03/21. 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 ? ?  ?[name] Comments  ? ? [name] motivated to participate; RN provided [name] with anxiety medication prior to session. [name] able to participate in bed level warm up exercises and ambulating 25 ft x 2 with a walker and chair follow. Will need continued practice and education with LVAD management. [name] presents with decreased cardiopulmonary endurance, generalized weakness, increased edema, impaired balance. Suspect excellent progress given age, motivation and PLOF. Recommend AIR to address deficits and maximize functional mobility.  ?  ?Recommendations for follow up therapy are one component of a multi-disciplinary discharge planning process, led by the attending physician.  Recommendations may be updated based on patient status, additional functional criteria and insurance authorization. ? ?Follow Up Recommendations ? Acute inpatient rehab (3hours/day) ?  ?  ?Assistance Recommended at Discharge Frequent or constant Supervision/Assistance  ?Patient can return home with the following A lot of help with walking and/or transfers;A lot of help with bathing/dressing/bathroom;Assistance with cooking/housework;Direct supervision/assist for financial management ?  ?Equipment Recommendations ? Rollator (4 wheels);BSC/3in1  ?  ?Recommendations for Other Services   ? ? ?  ?Precautions / Restrictions Precautions ?Precautions: Fall;Other (comment);Sternal ?Precaution Booklet Issued: No ?Precaution Comments: LVAD ?Restrictions ?Weight Bearing Restrictions: Yes ?Other Position/Activity Restrictions: sternal precautions  ?  ? ?Mobility ? Bed Mobility ?Overal bed mobility: Needs Assistance ?Bed Mobility: Supine to  Sit, Sit to Supine ?  ?  ?Supine to sit: Mod assist, +2 for physical assistance ?Sit to supine: Mod assist, +2 for physical assistance ?  ?General bed mobility comments: Cues for initiation, assist for trunk, use of bed pad to scoot hips ?  ? ?Transfers ?Overall transfer level: Needs assistance ?Equipment used: 2 person hand held assist ?Transfers: Sit to/from Stand ?Sit to Stand: Mod assist, Min assist, +2 physical assistance ?  ?  ?  ?  ?  ?General transfer comment: Min-modA + 2 to power up from edge of bed and chair x 2. cues for "hands on knees." ?  ? ?Ambulation/Gait ?Ambulation/Gait assistance: Min assist, +2 safety/equipment ?Gait Distance (Feet): 50 Feet (25 ft x 2) ?Assistive device: Ethelene Hal ?Gait Pattern/deviations: Step-through pattern, Decreased stride length ?Gait velocity: decreased ?  ?  ?General Gait Details: Good posture, mild lateral sway, minA for balance and close chair follow. Required one seated rest break ? ? ?Stairs ?  ?  ?  ?  ?  ? ? ?Wheelchair Mobility ?  ? ?Modified Rankin (Stroke Patients Only) ?  ? ? ?  ?Balance Overall balance assessment: Needs assistance ?Sitting-balance support: Feet supported, No upper extremity supported ?Sitting balance-Leahy Scale: Good ?  ?  ?Standing balance support: Bilateral upper extremity supported, During functional activity ?Standing balance-Leahy Scale: Poor ?  ?  ?  ?  ?  ?  ?  ?  ?  ?  ?  ?  ?  ? ?  ?Cognition Arousal/Alertness: Awake/alert ?Behavior During Therapy: Anxious ?Overall Cognitive Status: Impaired/Different from baseline ?Area of Impairment: Memory ?  ?  ?  ?  ?  ?  ?  ?  ?  ?  ?Memory: Decreased recall of precautions ?  ?  ?  ?Problem Solving: Slow processing ?  ?  ?  ? ?  ?  Exercises General Exercises - Lower Extremity ?Ankle Circles/Pumps: Both, 20 reps, Supine ?Long Arc Quad: Both, 10 reps, Seated ?Heel Slides: AAROM, Both, 10 reps, Supine ?Hip ABduction/ADduction: AAROM, Both, 10 reps, Supine ? ?  ?General Comments General comments  (skin integrity, edema, etc.): HR up to 130 bpm, SpO2 > 90% on 2-4L O2 ?  ?  ? ?Pertinent Vitals/Pain Pain Assessment ?Pain Assessment: Faces ?Faces Pain Scale: Hurts a little bit ?Pain Location: chest at incision ?Pain Descriptors / Indicators: Aching, Guarding, Sore ?Pain Intervention(s): Monitored during session  ? ? ?Home Living   ?  ?  ?  ?  ?  ?  ?  ?  ?  ?   ?  ?Prior Function    ?  ?  ?   ? ?[name] Goals (current goals can now be found in the care plan section) Acute Rehab [name] Goals ?Patient Stated Goal: return to walking and reading books ?Potential to Achieve Goals: Good ?Progress towards [name] goals: Progressing toward goals ? ?  ?Frequency ? ? ? Min 4X/week ? ? ? ?  ?[name] Plan Current plan remains appropriate  ? ? ?Co-evaluation   ?  ?  ?  ?  ? ?  ?AM-PAC [name] "6 Clicks" Mobility   ?Outcome Measure ? Help needed turning from your back to your side while in a flat bed without using bedrails?: A Lot ?Help needed moving from lying on your back to sitting on the side of a flat bed without using bedrails?: A Lot ?Help needed moving to and from a bed to a chair (including a wheelchair)?: A Lot ?Help needed standing up from a chair using your arms (e.g., wheelchair or bedside chair)?: A Lot ?Help needed to walk in hospital room?: A Little ?Help needed climbing 3-5 steps with a railing? : Total ?6 Click Score: 12 ? ?  ?End of Session Equipment Utilized During Treatment: Oxygen ?Activity Tolerance: Patient tolerated treatment well ?Patient left: in bed;with call Devargas/phone within reach;with nursing/sitter in room ?Nurse Communication: Mobility status ?[name] Visit Diagnosis: Other abnormalities of gait and mobility (R26.89);Muscle weakness (generalized) (M62.81) ?  ? ? ?Time: 8832-5498 ?[name] Time Calculation (min) (ACUTE ONLY): 52 min ? ?Charges:  $Therapeutic Activity: 38-52 mins          ?          ? ?Wyona Almas, [name], DPT ?Acute Rehabilitation Services ?Pager [phone] ?Office [phone] ? ? ? ?Deno Etienne ?09/10/2021, 4:16 PM ? ?

## 2021-09-10 NOTE — Progress Notes (Signed)
HeartMate 3 Rounding Note ? ?Postop day  #7 HeartMate 3 Implantation March 14 ? ?Subjective:   ? ?Back in nsr with intermittent afib ?Wt down with lasix drip= walked in hallway with PT for first time today ?INR down to 2.2- will DC EPW and resume coumadin dosing ? ? ?LVAD INTERROGATION:  ?HeartMate 3LVAD:  Flow 4.5 liters/min, speed 5400, power 4.3 PI 2.5.  Controller  intact.  ? ?Objective:   ? ?Vital Signs:   ?Temp:  [97.8 ?F (36.6 ?C)-99 ?F (37.2 ?C)] 97.8 ?F (36.6 ?C) (03/21 1100) ?Pulse Rate:  [64-135] 81 (03/21 1300) ?Resp:  [22-38] 29 (03/21 1400) ?SpO2:  [70 %-100 %] 97 % (03/21 1300) ?Arterial Line BP: (77-107)/(59-82) 86/66 (03/21 1400) ?Weight:  [110 kg] 110 kg (03/21 0600) ?Last BM Date : 09/10/21 ?Mean arterial Pressure 70-80 ? ?Intake/Output:  ? ?Intake/Output Summary (Last 24 hours) at 09/10/2021 1601 ?Last data filed at 09/10/2021 1400 ?Gross per 24 hour  ?Intake 2253.9 ml  ?Output 4100 ml  ?Net -1846.1 ml  ?  ? ?Physical Exam: ?General:  Well appearing. No resp difficulty, mild tachypnea. Sternal incision dry ?HEENT: normal ?Neck: supple. JVP . Carotids 2+ bilat; no bruits. No lymphadenopathy or thryomegaly appreciated. L neck drain at Impella 5.5 out and incision healing ?Cor: Mechanical heart sounds with LVAD hum present. ?Lungs: clear ?Abdomen: soft, nontender, nondistended. No hepatosplenomegaly. No bruits or masses. Good bowel sounds. ?Extremities: no cyanosis, clubbing, rash, edema ?Neuro: alert & orientedx3, cranial nerves grossly intact. moves all 4 extremities w/o difficulty. Affect pleasant ? ?Telemetry: sinus tach ? ?Labs: ?Basic Metabolic Panel: ?Recent Labs  ?Lab 09/06/21 ?0355 09/06/21 ?0415 09/07/21 ?0147 09/07/21 ?3329 09/08/21 ?0141 09/08/21 ?0406 09/09/21 ?5188 09/09/21 ?1320 09/09/21 ?1718 09/09/21 ?2009 09/10/21 ?4166 09/10/21 ?0630 09/10/21 ?1200  ?NA 130*   < > 132*   < > 131*   < > 135 135  --  134* 134* 135 134*  ?K 3.8   < > 3.2*   < > 2.6*   < > 3.6 3.8  --  3.6 3.6 3.1* 3.3*   ?CL 99   < > 98   < > 95*   < > 99 98  --  97*  --  95* 96*  ?CO2 24   < > 24   < > 24   < > 26 26  --  26  --  28 25  ?GLUCOSE 105*   < > 86   < > 101*   < > 99 133*  --  113*  --  107* 114*  ?BUN 30*   < > 32*   < > 32*   < > 33* 33*  --  34*  --  36* 35*  ?CREATININE 0.94   < > 0.97   < > 0.84   < > 0.86 0.91  --  0.93  --  0.89 0.93  ?CALCIUM 8.6*   < > 8.8*   < > 8.8*   < > 9.2 9.2  --  9.3  --  9.2 9.0  ?MG 2.4  --  2.1   < > 2.1  --  1.7  --  2.5*  --   --  2.0 1.8  ?PHOS 3.2  --  2.5  --  2.3*  --  2.8  --   --   --   --  3.8  --   ? < > = values in this interval not displayed.  ? ? ?Liver Function  Tests: ?Recent Labs  ?Lab 09/04/21 ?0311 09/05/21 ?0400 09/06/21 ?0355  ?AST 92* 85* 53*  ?ALT '31 27 25  '$ ?ALKPHOS 37* 44 60  ?BILITOT 1.5* 2.1* 1.6*  ?PROT 4.4* 4.8* 5.0*  ?ALBUMIN 3.0* 2.8* 2.5*  ? ?No results for input(s): LIPASE, AMYLASE in the last 168 hours. ?No results for input(s): AMMONIA in the last 168 hours. ? ?CBC: ?Recent Labs  ?Lab 09/06/21 ?1738 09/07/21 ?0147 09/07/21 ?8786 09/08/21 ?0406 09/08/21 ?0408 09/09/21 ?7672 09/10/21 ?0947 09/10/21 ?0962  ?WBC 26.6* 23.9*  --   --  25.1* 30.7*  --  32.4*  ?NEUTROABS 22.1* 19.8*  --   --  22.1* 25.0*  --  26.9*  ?HGB 8.8* 8.4*   < > 9.9* 9.5* 9.6* 10.5* 9.5*  ?HCT 26.9* 25.6*   < > 29.0* 28.9* 29.9* 31.0* 30.5*  ?MCV 88.8 88.6  --   --  87.8 89.0  --  88.9  ?PLT 116* 111*  --   --  176 240  --  343  ? < > = values in this interval not displayed.  ? ? ?INR: ?Recent Labs  ?Lab 09/06/21 ?0355 09/07/21 ?0147 09/08/21 ?0408 09/09/21 ?0253 09/10/21 ?8366  ?INR 2.4* 3.3* 2.5* 2.9* 2.2*  ? ? ?Other results: ?EKG:  ? ?Imaging: ?DG Chest Port 1 View ? ?Result Date: 09/10/2021 ?CLINICAL DATA:  LVAD.  History of CHF, defibrillator. EXAM: PORTABLE CHEST 1 VIEW COMPARISON:  Portable chest yesterday at 5:11 a.m. FINDINGS: 5:16 a.m., 09/10/2021. Left chest AICD and wiring, LVAD, and right PICC line are unchanged. Right PICC again terminates in the right atrium. Heart is  enlarged. The mediastinal configuration is stable. Central vascular prominence and mild central edema continue to be noted as well as a small right pleural effusion and asymmetric opacity of the right lower lung field consistent with atelectasis or consolidation. Elevation of the right hemidiaphragm is also unchanged. Remainder of the lungs are clear. There is stable overall aeration. IMPRESSION: Stable atelectasis or airspace disease right lower lung field, with cardiomegaly, perihilar vascular congestion and mild central edema. Overall aeration, small right pleural effusion and mild central edema are unchanged. Electronically Signed   By: Telford Nab M.D.   On: 09/10/2021 06:35  ? ?DG Chest Port 1 View ? ?Result Date: 09/09/2021 ?CLINICAL DATA:  History of hypertension, MI, cardiomyopathy. Defibrillator placement and recent LVAD placement EXAM: PORTABLE CHEST 1 VIEW COMPARISON:  Chest radiograph 09/08/2021 FINDINGS: The left chest wall cardiac device and associated leads, median sternotomy wires, and LVAD are stable. The right upper extremity PICC terminating in the right atrium is stable. The heart is enlarged, unchanged. The upper mediastinal contours are stable. Lung volumes are low. Is no focal consolidation. There is no definite overt pulmonary edema. There is no significant pleural effusion, though the left costophrenic angle is partially obscured. There is no appreciable pneumothorax. There is no acute osseous abnormality. IMPRESSION: Low lung volumes. No new or worsening focal airspace disease. Overall, no significant interval change since 09/08/2021. Electronically Signed   By: Valetta Mole M.D.   On: 09/09/2021 07:02   ? ? ?Medications:   ? ? ?Scheduled Medications: ? (feeding supplement) PROSource Plus  60 mL Oral TID BM  ? sodium chloride   Intravenous Once  ? sodium chloride   Intravenous Once  ? aspirin EC  81 mg Oral Daily  ? bisacodyl  10 mg Oral Daily  ? Chlorhexidine Gluconate Cloth  6 each  Topical Daily  ? Chlorhexidine Gluconate Cloth  6 each Topical Daily  ? digoxin  0.0625 mg Oral Daily  ? feeding supplement  1 Container Oral BID WC  ? insulin aspart  0-24 Units Subcutaneous TID WC & HS  ? methimazole  10 mg Oral Daily  ? methimazole  5 mg Oral QPM  ? pantoprazole  40 mg Oral Daily  ? polyethylene glycol  17 g Oral Daily  ? potassium chloride  40 mEq Oral Once  ? predniSONE  20 mg Oral Q breakfast  ? quiniDINE gluconate  324 mg Oral BID  ? rosuvastatin  40 mg Oral QHS  ? senna-docusate  2 tablet Oral BID  ? sildenafil  20 mg Oral TID  ? sodium chloride flush  10-40 mL Intracatheter Q12H  ? spironolactone  25 mg Oral Daily  ? warfarin  2 mg Oral ONCE-1600  ? Warfarin - Physician Dosing Inpatient   Does not apply q1600  ? ? ?Infusions: ? sodium chloride Stopped (09/05/21 1205)  ? sodium chloride    ? sodium chloride Stopped (09/09/21 0758)  ? sodium chloride    ? amiodarone 60 mg/hr (09/10/21 1541)  ? ceFEPime (MAXIPIME) IV 2 g (09/10/21 1544)  ? furosemide (LASIX) 200 mg in dextrose 5% 100 mL ('2mg'$ /mL) infusion 10 mg/hr (09/10/21 1400)  ? lactated ringers    ? lactated ringers Stopped (09/05/21 1010)  ? potassium chloride 10 mEq (09/10/21 1537)  ? ? ?PRN Medications: ?sodium chloride, Place/Maintain arterial line **AND** sodium chloride, ALPRAZolam, midazolam, morphine injection, ondansetron (ZOFRAN) IV, oxyCODONE, sodium chloride flush, sodium chloride flush, sodium chloride flush, traMADol ? ? ?Assessment:  ?History of nonischemic cardiomyopathy with recent readmission for worsening heart failure and cardiogenic shock ? ?Preoperative bridge with Impella 5.5 LVAD to stabilize hemodynamics and improve organ function. ? ?Patient developed significant leukocytosis without fever and with negative cultures prior to implantation after steroid therapy initiated for hyperthyroid condition. ? ?Implantation of HeartMate 3 and removal of Impella 5.5 on September 03, 2021-perioperative coagulopathy and  thrombocytopenia ? ? ?Plan/Discussion:   ?Patient progressing slowly with issues of fluid retention and surgical pain.Intermittent afib when potassium levels drop  Now diuresing and chest x-ray with minimal intersti

## 2021-09-10 NOTE — Progress Notes (Addendum)
HeartMate 2 Rounding Note ? ?Subjective:   ? ?- 3/14: HM3 LVAD placement with removal of Impella 5.5  ?- 3/15: Speed increased to 5300 rpm. Given 1UPRBC. Febrile. Cultures obtained. Extubated.  ?- 3/16 VT paced out. On amio drip. Given 1UPRBCs . Given couple doses of IV lasix.  ?- 3/18 Rapid ALF. Bolused multiple times with amio and drip turned up to 60. Now back in NSR ?- 3/19 VAD Speed increased to 5400 (high PI)  ?- 3/20 Recurrent ALF. Bolused w/ amio. Milrinone discontinued ? ?Back in SR this morning, sinus tach low 100s. Converted ~11 PM and had brief recurrence ~6 am today, lasting ~40 min.  ? ?Co-ox stable off milrinone, 66% ? ?4.5L in UOP yesterday. Net negative 2.2 L. Wt down 6 lb. Still 20 lb above pre-op wt. CVP 13  ? ?Scr stable, 0.89 ?K 3.1 ?Mg 2.0 ? ?INR 2.2 ? ?LDH 498>>519>>534  ? ?Remains on cefepime WBC 24K -> 25->>30>>32K. No fevers. On po prednisone.  ? ?Still feels SOB. Sitting up in bed. Husband at bedside.  ? ? ?LVAD INTERROGATION:  ?HeartMate II LVAD:  Flow 4.6  liters/min, speed 5400, power 3.9, PI 3.2  VAD interrogated personally. Parameters stable. ? ? ?Objective:   ? ?Vital Signs:   ?Temp:  [97.9 ?F (36.6 ?C)-99 ?F (37.2 ?C)] 98.4 ?F (36.9 ?C) (03/21 0500) ?Pulse Rate:  [91-148] 91 (03/21 0500) ?Resp:  [21-35] 31 (03/21 0700) ?SpO2:  [65 %-100 %] 83 % (03/21 0500) ?Arterial Line BP: (74-107)/(59-79) 86/77 (03/21 0700) ?Weight:  [110 kg] 110 kg (03/21 0600) ?Last BM Date : 09/10/21 ?Mean arterial Pressure 70s-80s ? ?Intake/Output:  ? ?Intake/Output Summary (Last 24 hours) at 09/10/2021 0709 ?Last data filed at 09/10/2021 0700 ?Gross per 24 hour  ?Intake 2223.88 ml  ?Output 4450 ml  ?Net -2226.12 ml  ?  ?Physical exam ?CVP 113 ?General: sitting up in chair, SOB w/ conversation  ?HEENT: normal  ?Neck: supple. JVD 12-14 cm  Carotids 2+ bilat; no bruits. No lymphadenopathy or thryomegaly appreciated. ?Cor: LVAD hum. Reg Rhythm, tachy rate  ?Lungs: decreased BS at the bases bilaterally   ?Abdomen:  obese soft, nontender, non-distended. No hepatosplenomegaly. No bruits or masses. Good bowel sounds. Driveline site clean. Anchor in place.  ?Extremities: no cyanosis, clubbing, rash. 2+ edema ?Neuro: alert & oriented x 3. No focal deficits. Moves all 4 without problem  ? ? ?Telemetry: Sinus tach low 100s, brief Afib ~6 am lasting ~40 min. Personally reviewed ? ? ? ?Labs: ?Basic Metabolic Panel: ?Recent Labs  ?Lab 09/06/21 ?0355 09/06/21 ?0415 09/07/21 ?0147 09/07/21 ?2751 09/07/21 ?2003 09/08/21 ?0141 09/08/21 ?0406 09/08/21 ?1921 09/09/21 ?7001 09/09/21 ?1320 09/09/21 ?1718 09/09/21 ?2009 09/10/21 ?7494 09/10/21 ?4967  ?NA 130*   < > 132*   < >  --  131*   < > 133* 135 135  --  134* 134* 135  ?K 3.8   < > 3.2*   < >  --  2.6*   < > 3.7 3.6 3.8  --  3.6 3.6 3.1*  ?CL 99   < > 98   < >  --  95*   < > 99 99 98  --  97*  --  95*  ?CO2 24   < > 24   < >  --  24   < > '25 26 26  '$ --  26  --  28  ?GLUCOSE 105*   < > 86   < >  --  101*   < >  112* 99 133*  --  113*  --  107*  ?BUN 30*   < > 32*   < >  --  32*   < > 34* 33* 33*  --  34*  --  36*  ?CREATININE 0.94   < > 0.97   < >  --  0.84   < > 0.94 0.86 0.91  --  0.93  --  0.89  ?CALCIUM 8.6*   < > 8.8*   < >  --  8.8*   < > 9.2 9.2 9.2  --  9.3  --  9.2  ?MG 2.4  --  2.1  --  1.9 2.1  --   --  1.7  --  2.5*  --   --  2.0  ?PHOS 3.2  --  2.5  --   --  2.3*  --   --  2.8  --   --   --   --  3.8  ? < > = values in this interval not displayed.  ? ? ?Liver Function Tests: ?Recent Labs  ?Lab 09/04/21 ?0311 09/05/21 ?0400 09/06/21 ?0355  ?AST 92* 85* 53*  ?ALT '31 27 25  '$ ?ALKPHOS 37* 44 60  ?BILITOT 1.5* 2.1* 1.6*  ?PROT 4.4* 4.8* 5.0*  ?ALBUMIN 3.0* 2.8* 2.5*  ? ?No results for input(s): LIPASE, AMYLASE in the last 168 hours. ?No results for input(s): AMMONIA in the last 168 hours. ? ?CBC: ?Recent Labs  ?Lab 09/06/21 ?1738 09/07/21 ?0147 09/07/21 ?9211 09/08/21 ?0406 09/08/21 ?0408 09/09/21 ?9417 09/10/21 ?4081 09/10/21 ?4481  ?WBC 26.6* 23.9*  --   --  25.1* 30.7*  --  32.4*   ?NEUTROABS 22.1* 19.8*  --   --  22.1* 25.0*  --  26.9*  ?HGB 8.8* 8.4*   < > 9.9* 9.5* 9.6* 10.5* 9.5*  ?HCT 26.9* 25.6*   < > 29.0* 28.9* 29.9* 31.0* 30.5*  ?MCV 88.8 88.6  --   --  87.8 89.0  --  88.9  ?PLT 116* 111*  --   --  176 240  --  343  ? < > = values in this interval not displayed.  ? ? ?INR: ?Recent Labs  ?Lab 09/06/21 ?0355 09/07/21 ?0147 09/08/21 ?0408 09/09/21 ?0253 09/10/21 ?8563  ?INR 2.4* 3.3* 2.5* 2.9* 2.2*  ? ? ?Other results: ? ? ?Imaging: ?DG Chest Port 1 View ? ?Result Date: 09/10/2021 ?CLINICAL DATA:  LVAD.  History of CHF, defibrillator. EXAM: PORTABLE CHEST 1 VIEW COMPARISON:  Portable chest yesterday at 5:11 a.m. FINDINGS: 5:16 a.m., 09/10/2021. Left chest AICD and wiring, LVAD, and right PICC line are unchanged. Right PICC again terminates in the right atrium. Heart is enlarged. The mediastinal configuration is stable. Central vascular prominence and mild central edema continue to be noted as well as a small right pleural effusion and asymmetric opacity of the right lower lung field consistent with atelectasis or consolidation. Elevation of the right hemidiaphragm is also unchanged. Remainder of the lungs are clear. There is stable overall aeration. IMPRESSION: Stable atelectasis or airspace disease right lower lung field, with cardiomegaly, perihilar vascular congestion and mild central edema. Overall aeration, small right pleural effusion and mild central edema are unchanged. Electronically Signed   By: Telford Nab M.D.   On: 09/10/2021 06:35  ? ?DG Chest Port 1 View ? ?Result Date: 09/09/2021 ?CLINICAL DATA:  History of hypertension, MI, cardiomyopathy. Defibrillator placement and recent LVAD placement EXAM: PORTABLE CHEST 1 VIEW COMPARISON:  Chest radiograph 09/08/2021  FINDINGS: The left chest wall cardiac device and associated leads, median sternotomy wires, and LVAD are stable. The right upper extremity PICC terminating in the right atrium is stable. The heart is enlarged,  unchanged. The upper mediastinal contours are stable. Lung volumes are low. Is no focal consolidation. There is no definite overt pulmonary edema. There is no significant pleural effusion, though the left costophrenic angle is partially obscured. There is no appreciable pneumothorax. There is no acute osseous abnormality. IMPRESSION: Low lung volumes. No new or worsening focal airspace disease. Overall, no significant interval change since 09/08/2021. Electronically Signed   By: Valetta Mole M.D.   On: 09/09/2021 07:02   ? ? ?Medications:   ? ? ?Scheduled Medications: ? (feeding supplement) PROSource Plus  60 mL Oral TID BM  ? sodium chloride   Intravenous Once  ? sodium chloride   Intravenous Once  ? aspirin EC  81 mg Oral Daily  ? bisacodyl  10 mg Oral Daily  ? Chlorhexidine Gluconate Cloth  6 each Topical Daily  ? Chlorhexidine Gluconate Cloth  6 each Topical Daily  ? digoxin  0.0625 mg Oral Daily  ? feeding supplement  1 Container Oral BID WC  ? insulin aspart  0-24 Units Subcutaneous TID WC & HS  ? methimazole  10 mg Oral Daily  ? methimazole  5 mg Oral QPM  ? pantoprazole  40 mg Oral Daily  ? polyethylene glycol  17 g Oral Daily  ? predniSONE  20 mg Oral Q breakfast  ? quiniDINE gluconate  324 mg Oral BID  ? rosuvastatin  40 mg Oral QHS  ? senna-docusate  2 tablet Oral BID  ? sildenafil  20 mg Oral TID  ? sodium chloride flush  10-40 mL Intracatheter Q12H  ? spironolactone  25 mg Oral Daily  ? Warfarin - Physician Dosing Inpatient   Does not apply q1600  ? ? ?Infusions: ? sodium chloride Stopped (09/05/21 1205)  ? sodium chloride    ? sodium chloride Stopped (09/09/21 0758)  ? sodium chloride    ? amiodarone 60 mg/hr (09/10/21 0700)  ? ceFEPime (MAXIPIME) IV Stopped (09/10/21 0540)  ? furosemide (LASIX) 200 mg in dextrose 5% 100 mL ('2mg'$ /mL) infusion 10 mg/hr (09/10/21 0700)  ? lactated ringers    ? lactated ringers Stopped (09/05/21 1010)  ? potassium chloride 50 mL/hr at 09/10/21 0700  ? ? ?PRN  Medications: ?sodium chloride, Place/Maintain arterial line **AND** sodium chloride, ALPRAZolam, midazolam, morphine injection, ondansetron (ZOFRAN) IV, oxyCODONE, sodium chloride flush, sodium chloride flush, sodium chlor

## 2021-09-11 ENCOUNTER — Other Ambulatory Visit (HOSPITAL_COMMUNITY): Payer: Self-pay

## 2021-09-11 ENCOUNTER — Inpatient Hospital Stay (HOSPITAL_COMMUNITY): Payer: BC Managed Care – PPO

## 2021-09-11 DIAGNOSIS — I5021 Acute systolic (congestive) heart failure: Secondary | ICD-10-CM

## 2021-09-11 DIAGNOSIS — Z95811 Presence of heart assist device: Secondary | ICD-10-CM | POA: Diagnosis not present

## 2021-09-11 DIAGNOSIS — I5043 Acute on chronic combined systolic (congestive) and diastolic (congestive) heart failure: Secondary | ICD-10-CM | POA: Diagnosis not present

## 2021-09-11 DIAGNOSIS — R570 Cardiogenic shock: Secondary | ICD-10-CM | POA: Diagnosis not present

## 2021-09-11 DIAGNOSIS — I428 Other cardiomyopathies: Secondary | ICD-10-CM | POA: Diagnosis not present

## 2021-09-11 DIAGNOSIS — I5023 Acute on chronic systolic (congestive) heart failure: Secondary | ICD-10-CM | POA: Diagnosis not present

## 2021-09-11 LAB — BASIC METABOLIC PANEL
Anion gap: 11 (ref 5–15)
Anion gap: 10 (ref 5–15)
BUN: 36 mg/dL — ABNORMAL HIGH (ref 6–20)
BUN: 37 mg/dL — ABNORMAL HIGH (ref 6–20)
CO2: 30 mmol/L (ref 22–32)
CO2: 28 mmol/L (ref 22–32)
Calcium: 9.4 mg/dL (ref 8.9–10.3)
Calcium: 9.3 mg/dL (ref 8.9–10.3)
Chloride: 93 mmol/L — ABNORMAL LOW (ref 98–111)
Chloride: 98 mmol/L (ref 98–111)
Creatinine, Ser: 1 mg/dL (ref 0.44–1.00)
Creatinine, Ser: 1.02 mg/dL — ABNORMAL HIGH (ref 0.44–1.00)
GFR, Estimated: >60 mL/min (ref >60)
GFR, Estimated: >60 mL/min (ref >60)
Glucose, Bld: 110 mg/dL — ABNORMAL HIGH (ref 70–99)
Glucose, Bld: 108 mg/dL — ABNORMAL HIGH (ref 70–99)
Potassium: 3.1 mmol/L — ABNORMAL LOW (ref 3.5–5.1)
Potassium: 4.6 mmol/L (ref 3.5–5.1)
Sodium: 134 mmol/L — ABNORMAL LOW (ref 135–145)
Sodium: 136 mmol/L (ref 135–145)

## 2021-09-11 LAB — PROTIME-INR
INR: 1.8 — ABNORMAL HIGH (ref 0.8–1.2)
Prothrombin Time: 20.8 seconds — ABNORMAL HIGH (ref 11.4–15.2)

## 2021-09-11 LAB — CBC WITH DIFFERENTIAL/PLATELET
Abs Immature Granulocytes: 0 10*3/uL (ref 0.00–0.07)
Basophils Absolute: 0 10*3/uL (ref 0.0–0.1)
Basophils Relative: 0 %
Eosinophils Absolute: 0 10*3/uL (ref 0.0–0.5)
Eosinophils Relative: 0 %
HCT: 31.1 % — ABNORMAL LOW (ref 36.0–46.0)
Hemoglobin: 9.7 g/dL — ABNORMAL LOW (ref 12.0–15.0)
Lymphocytes Relative: 8 %
Lymphs Abs: 2.1 10*3/uL (ref 0.7–4.0)
MCH: 28 pg (ref 26.0–34.0)
MCHC: 31.2 g/dL (ref 30.0–36.0)
MCV: 89.9 fL (ref 80.0–100.0)
Monocytes Absolute: 1.3 10*3/uL — ABNORMAL HIGH (ref 0.1–1.0)
Monocytes Relative: 5 %
Neutro Abs: 22.8 10*3/uL — ABNORMAL HIGH (ref 1.7–7.7)
Neutrophils Relative %: 87 %
Platelets: 435 10*3/uL — ABNORMAL HIGH (ref 150–400)
RBC: 3.46 MIL/uL — ABNORMAL LOW (ref 3.87–5.11)
RDW: 22.1 % — ABNORMAL HIGH (ref 11.5–15.5)
WBC: 26.2 10*3/uL — ABNORMAL HIGH (ref 4.0–10.5)
nRBC: 0.9 % — ABNORMAL HIGH (ref 0.0–0.2)
nRBC: 2 /100 WBC — ABNORMAL HIGH

## 2021-09-11 LAB — COOXEMETRY PANEL
Carboxyhemoglobin: 2.2 % — ABNORMAL HIGH (ref 0.5–1.5)
Carboxyhemoglobin: 1.9 % — ABNORMAL HIGH (ref 0.5–1.5)
Carboxyhemoglobin: 2.3 % — ABNORMAL HIGH (ref 0.5–1.5)
Carboxyhemoglobin: 2.1 % — ABNORMAL HIGH (ref 0.5–1.5)
Methemoglobin: <0.7 % (ref 0.0–1.5)
Methemoglobin: <0.7 % (ref 0.0–1.5)
Methemoglobin: <0.7 % (ref 0.0–1.5)
Methemoglobin: <0.7 % (ref 0.0–1.5)
O2 Saturation: 58.2 %
O2 Saturation: 51.5 %
O2 Saturation: 99.1 %
O2 Saturation: 54.6 %
Total hemoglobin: 9.9 g/dL — ABNORMAL LOW (ref 12.0–16.0)
Total hemoglobin: 10.1 g/dL — ABNORMAL LOW (ref 12.0–16.0)
Total hemoglobin: 12 g/dL (ref 12.0–16.0)
Total hemoglobin: 10.5 g/dL — ABNORMAL LOW (ref 12.0–16.0)

## 2021-09-11 LAB — ECHOCARDIOGRAM LIMITED
Height: 63 in
Weight: 3880.1 oz

## 2021-09-11 LAB — GLUCOSE, CAPILLARY
Glucose-Capillary: 102 mg/dL — ABNORMAL HIGH (ref 70–99)
Glucose-Capillary: 138 mg/dL — ABNORMAL HIGH (ref 70–99)
Glucose-Capillary: 89 mg/dL (ref 70–99)
Glucose-Capillary: 112 mg/dL — ABNORMAL HIGH (ref 70–99)

## 2021-09-11 LAB — LACTATE DEHYDROGENASE: LDH: 548 U/L — ABNORMAL HIGH (ref 98–192)

## 2021-09-11 LAB — MAGNESIUM
Magnesium: 2.1 mg/dL (ref 1.7–2.4)
Magnesium: 2.1 mg/dL (ref 1.7–2.4)

## 2021-09-11 MED ORDER — POTASSIUM CHLORIDE 10 MEQ/50ML IV SOLN
10.0000 meq | INTRAVENOUS | Status: AC
  Administered 2021-09-11 (×4): 10 meq via INTRAVENOUS
  Filled 2021-09-11: qty 50

## 2021-09-11 MED ORDER — POTASSIUM CHLORIDE 10 MEQ/50ML IV SOLN
10.0000 meq | INTRAVENOUS | Status: AC
  Administered 2021-09-11 (×3): 10 meq via INTRAVENOUS
  Filled 2021-09-11 (×3): qty 50

## 2021-09-11 MED ORDER — POTASSIUM CHLORIDE CRYS ER 20 MEQ PO TBCR: 20.0000 meq | EXTENDED_RELEASE_TABLET | ORAL | Status: DC

## 2021-09-11 MED ORDER — MIRTAZAPINE 7.5 MG PO TABS
7.5000 mg | ORAL_TABLET | Freq: Every day | ORAL | Status: DC
  Administered 2021-09-11 – 2021-09-19 (×9): 7.5 mg via ORAL
  Filled 2021-09-11 (×9): qty 1

## 2021-09-11 MED ORDER — POTASSIUM CHLORIDE ER 10 MEQ PO TBCR
40.0000 meq | EXTENDED_RELEASE_TABLET | Freq: Once | ORAL | Status: AC
  Administered 2021-09-11: 40 meq via ORAL
  Filled 2021-09-11 (×2): qty 4

## 2021-09-11 MED ORDER — GABAPENTIN 100 MG PO CAPS
100.0000 mg | ORAL_CAPSULE | Freq: Three times a day (TID) | ORAL | Status: DC
  Administered 2021-09-11 – 2021-09-19 (×24): 100 mg via ORAL
  Filled 2021-09-11 (×24): qty 1

## 2021-09-11 MED ORDER — SIMETHICONE 80 MG PO CHEW
80.0000 mg | CHEWABLE_TABLET | Freq: Four times a day (QID) | ORAL | Status: DC
  Administered 2021-09-11 – 2021-09-12 (×7): 80 mg via ORAL
  Filled 2021-09-11 (×9): qty 1

## 2021-09-11 MED ORDER — METOLAZONE 2.5 MG PO TABS
2.5000 mg | ORAL_TABLET | Freq: Two times a day (BID) | ORAL | Status: AC
  Administered 2021-09-11 (×2): 2.5 mg via ORAL
  Filled 2021-09-11 (×2): qty 1

## 2021-09-11 MED ORDER — SORBITOL 70 % SOLN
30.0000 mL | Freq: Once | Status: DC
Start: 2021-09-11 — End: 2021-09-11

## 2021-09-11 MED ORDER — POTASSIUM CHLORIDE CRYS ER 10 MEQ PO TBCR
40.0000 meq | EXTENDED_RELEASE_TABLET | Freq: Once | ORAL | Status: AC
  Administered 2021-09-11: 40 meq via ORAL
  Filled 2021-09-11: qty 4

## 2021-09-11 MED ORDER — WARFARIN SODIUM 2.5 MG PO TABS
2.5000 mg | ORAL_TABLET | Freq: Once | ORAL | Status: AC
  Administered 2021-09-11: 2.5 mg via ORAL
  Filled 2021-09-11: qty 1

## 2021-09-11 NOTE — Progress Notes (Signed)
Physical Therapy Treatment ?Patient Details ?Name: [name] ?MRN: [id number] ?DOB: 08-31-1970 ?Today's Date: 09/11/2021 ? ? ?History of Present Illness 51yo female adm 3/6 with acute on chronic heart failure exacerbation. Impella placed  3/9. LVAD HM 3 implanted 09/03/21. 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 ? ?  ?PT Comments  ? ? Pt making incremental progress towards physical therapy goals, achieving slightly increased ambulation distance this session. Pt requiring two person min-moderate assist for functional mobility. Pt ambulating 50 ft, then 30 ft with an Harmon Pier walker and chair follow; requires one extended seated rest break. Education reinforced regarding LVAD supplies, steps for switching from wall power to batteries, and emergency bag. Pt continues to require cues for sternal precautions. D/c plan remains appropriate.  ?   ?Recommendations for follow up therapy are one component of a multi-disciplinary discharge planning process, led by the attending physician.  Recommendations may be updated based on patient status, additional functional criteria and insurance authorization. ? ?Follow Up Recommendations ? Acute inpatient rehab (3hours/day) ?  ?  ?Assistance Recommended at Discharge Frequent or constant Supervision/Assistance  ?Patient can return home with the following A lot of help with walking and/or transfers;A lot of help with bathing/dressing/bathroom;Assistance with cooking/housework;Direct supervision/assist for financial management ?  ?Equipment Recommendations ? Rollator (4 wheels);BSC/3in1  ?  ?Recommendations for Other Services   ? ? ?  ?Precautions / Restrictions Precautions ?Precautions: Fall ?Precaution Booklet Issued: No ?Precaution Comments: LVAD, sternal ?Restrictions ?Weight Bearing Restrictions: Yes ?RUE Weight Bearing: Partial weight bearing ?LUE Weight Bearing: Partial weight bearing ?Other Position/Activity Restrictions: sternal precautions  ?  ? ?Mobility ?  Bed Mobility ?Overal bed mobility: Needs Assistance ?Bed Mobility: Sit to Supine ?  ?  ?  ?Sit to supine: Mod assist, +2 for safety/equipment ?  ?General bed mobility comments: Assist for BLE's back into bed ?  ? ?Transfers ?Overall transfer level: Needs assistance ?Equipment used: None ?Transfers: Sit to/from Stand ?Sit to Stand: Mod assist, +2 physical assistance ?  ?  ?  ?  ?  ?General transfer comment: cues for "hand on knees," rocking to gain momentum, modA + 2 to power up to standing position ?  ? ?Ambulation/Gait ?Ambulation/Gait assistance: Min assist, +2 safety/equipment ?Gait Distance (Feet): 80 Feet (50 ft, 30 ft) ?Assistive device: Ethelene Hal ?Gait Pattern/deviations: Step-through pattern, Decreased stride length ?Gait velocity: decreased ?  ?  ?General Gait Details: Good posture, mild lateral sway, minA for balance and close chair follow. Required one seated rest break ? ? ?Stairs ?  ?  ?  ?  ?  ? ? ?Wheelchair Mobility ?  ? ?Modified Rankin (Stroke Patients Only) ?  ? ? ?  ?Balance Overall balance assessment: Needs assistance ?Sitting-balance support: Feet supported, No upper extremity supported ?Sitting balance-Leahy Scale: Good ?  ?  ?Standing balance support: Bilateral upper extremity supported, During functional activity ?Standing balance-Leahy Scale: Poor ?  ?  ?  ?  ?  ?  ?  ?  ?  ?  ?  ?  ?  ? ?  ?Cognition Arousal/Alertness: Awake/alert ?Behavior During Therapy: Anxious ?Overall Cognitive Status: Impaired/Different from baseline ?Area of Impairment: Memory ?  ?  ?  ?  ?  ?  ?  ?  ?  ?  ?Memory: Decreased recall of precautions ?  ?  ?  ?  ?General Comments: continued cues for sternal precautions ?  ?  ? ?  ?Exercises General Exercises - Lower Extremity ?  Ankle Circles/Pumps: Both, 10 reps, Seated ?Long Arc Quad: Both, 10 reps, Seated ? ?  ?General Comments General comments (skin integrity, edema, etc.): HR 99, 2-4 L O2 for comfort ?  ?  ? ?Pertinent Vitals/Pain Pain Assessment ?Pain Assessment:  Faces ?Faces Pain Scale: Hurts a little bit ?Pain Location: chest at incision ?Pain Descriptors / Indicators: Aching, Guarding, Sore ?Pain Intervention(s): Monitored during session  ? ? ?Home Living   ?  ?  ?  ?  ?  ?  ?  ?  ?  ?   ?  ?Prior Function    ?  ?  ?   ? ?PT Goals (current goals can now be found in the care plan section) Acute Rehab PT Goals ?Patient Stated Goal: return to walking and reading books ?Potential to Achieve Goals: Good ?Progress towards PT goals: Progressing toward goals ? ?  ?Frequency ? ? ? Min 4X/week ? ? ? ?  ?PT Plan Current plan remains appropriate  ? ? ?Co-evaluation   ?  ?  ?  ?  ? ?  ?AM-PAC PT "6 Clicks" Mobility   ?Outcome Measure ? Help needed turning from your back to your side while in a flat bed without using bedrails?: A Lot ?Help needed moving from lying on your back to sitting on the side of a flat bed without using bedrails?: A Lot ?Help needed moving to and from a bed to a chair (including a wheelchair)?: A Little ?Help needed standing up from a chair using your arms (e.g., wheelchair or bedside chair)?: A Lot ?Help needed to walk in hospital room?: A Little ?Help needed climbing 3-5 steps with a railing? : Total ?6 Click Score: 13 ? ?  ?End of Session Equipment Utilized During Treatment: Oxygen ?Activity Tolerance: Patient tolerated treatment well ?Patient left: in bed;with call [place]/phone within reach;with nursing/sitter in room ?Nurse Communication: Mobility status ?PT Visit Diagnosis: Other abnormalities of gait and mobility (R26.89);Muscle weakness (generalized) (M62.81) ?  ? ? ?Time: 9381-8299 ?PT Time Calculation (min) (ACUTE ONLY): 45 min ? ?Charges:  $Therapeutic Activity: 23-37 mins          ?          ? ?Wyona Almas, PT, DPT ?Acute Rehabilitation Services ?Pager [phone] ?Office [phone] ? ? ? ?Deno Etienne ?09/11/2021, 3:38 PM ? ?

## 2021-09-11 NOTE — Progress Notes (Addendum)
LVAD Coordinator Rounding Note: ? ?Admitted 08/26/21 due to acute on chronic systolic heart failure. Receiving ongoing treatment for hyperthyroidism with Methimazole and steroids. On 08/29/21 Impella 5.5 placed.  ? ?HM III LVAD implanted on 09/03/21 by Dr Prescott Gum under destination therapy criteria due to elevated BMI.  ? ?Pt sitting up in the recliner upon my arrival. Pt is conversationally short of breath this morning. CVP 11-12. Lasix increased to 12 mg/hr with good output noted.  ? ?Pt in/out of afib. Pt frustrated with rhythm changes, and reports anxiety and not feeling well with palpitations related to a-fib. Currently she is NSR rate 98.  ? ?Milrinone stopped with frequent arrhthymias. Co-ox 52% this morning. RAMP echo scheduled this afternoon at 1 pm.  ? ?WBC trending down to 26.2 today; afebrile. Completed Cefepime for positive urine culture. She remains on methimazole and PO Prednisone for hyperthyroidism. Will need endocrinology f/u appt at discharge.  ? ?Per bedside RN pt only able to walk short distance (out into the hallway to the desk by her room) before she becomes anxious and flustered and has to go back to her room. Per bedside RN husband appears anxious and upset when staff is encouraging pt to be out of bed and ambulating. Staff follows pt with recliner to allow for rest breaks as needed during activity. This morning pt refused to walk outside of her room due to a-fib (HR in 120s during activity at that time.)  ? ?Pt verbalized anxiety, fears of elevated heart rate during exercise, and shortness of breath. I had a long conversation with pt regarding her deconditioning, and need to be out of bed and ambulating in the hallway as tolerated. She may take PRN Xanax as needed for anxiety. Also discussed pt's poor PO intake. She reports she has no appetite and "just can't eat." She does not want CoreTrack. She is agreeable to drinking Boost. She states "if I knew it would be this hard I would not have had  the surgery." Encouraged her to keep trying her best, and over time things will improve, and that HF team is here to support her. She verbalized understanding and thanks for the support.  ? ?Pt's husband went home this morning to get some rest. Will continue VAD discharge education when appropriate.  ? ?Vital signs: ?Temp: 97.8 ?HR: 106 ST ?Doppler Pressure: not documented ?Arterial BP: 92/76 (84) ?O2 Sat: 97% 2 L  ?Wt: 243.2>244.7>248>242.6>242.5  lbs ? ?LVAD interrogation:  ?Speed: 5400 ?Flow: 4.6 ?Power: 4.0 w ?PI: 3.7 ? ?Alarms: none ?Events: none ? ?Hematocrit: 31 ?Fixed speed: 5300 ?Low speed limit: 5000 ? ?Drive Line: Existing VAD dressing clean, dry, and intact.  Drive line anchor correctly applied. Will advance to every other day dressing changes using daily kit per VAD coordinator or nurse champion. Next dressing change due: 09/12/21.  ? ?Labs:  ?LDH trend: [phone] ? ?INR trend: 0.8>1.6>2.9>2.2 ? ?WBC trend: 25.1>29.5>30.7>32.4 ? ?Hgb trend: 6.5>7.5>8.0>9.6>9.5 ? ?Anticoagulation Plan: ?-INR Goal: 2.0 - 2.5 ?-ASA Dose: 81 mg  ? ?Blood Products:  ?IntraOp: ? 4 PRBCs ? 4 FFP ? 2 Cryo ? 2 platelets ?DDAVP ? 1,400 cell saver ?Post Op: ?09/03/21: Factor 7 ? 1 PRBC ? 1 Platelet ?09/04/21: 1 PRBC ?09/05/21: 1 PRBC ? ?Device: ?-Medtronic  ?-Therapies: OFF ? 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 ?Respiratory: extubated 09/04/21  ? ?Infection: ?08/31/21>> blood cultures>> no growth 5 days; final ?09/04/21>> blood cultures>> no growth 5 days; final ?09/04/21>> sputum culture>> moderate WBC,  no organisms seen; rare candida albicans ?09/04/21>> urine culture>> enterococcus faecalis; final ? ?Drips: ?Amiodarone 60 mg/hr ?Lasix 10 mg/hr ? ?Patient Education: ?No family at bedside. ?Encouraged pt to review HeartMate III Handbook, and pt discharge binder.  ?Will plan for dressing change teaching with pt's husband tomorrow, or as his schedule allows. ? ?Plan/Recommendations:  ?1. Page VAD coordinators  with VAD equipment and drive line concerns. ?2. Daily drive line dressing change per VAD coordinator or nurse champion using daily kit. ? ?[name] RN ?VAD Coordinator  ?Office: [phone]  ?24/7 Pager: [phone]  ? ?  ?

## 2021-09-11 NOTE — Progress Notes (Addendum)
HeartMate 2 Rounding Note ? ?Subjective:   ? ?- 3/14: HM3 LVAD placement with removal of Impella 5.5  ?- 3/15: Speed increased to 5300 rpm. Given 1UPRBC. Febrile. Cultures obtained. Extubated.  ?- 3/16 VT paced out. On amio drip. Given 1UPRBCs . Given couple doses of IV lasix.  ?- 3/18 Rapid ALF. Bolused multiple times with amio and drip turned up to 60. Now back in NSR ?- 3/19 VAD Speed increased to 5400 (high PI)  ?- 3/20 Recurrent ALF. Bolused w/ amio. Milrinone discontinued ? ?Remains in Afib this morning. Rates 120s. MAPs 70s  ? ?Off Milrinone. Co-ox down, 58%>>52% on repeat. Ramp echo scheduled today.  ? ?4.4L in UOP yesterday. Net negative 2 L. CVP 11-12. Unsure if daily wts are accurate.  ? ?Scr ok, 1.0  ?K 3.1 ?Mg 2.1 ? ?LDH 498>>519>>534>>548  ?INR 1.8  ? ?Remains on cefepime WBC 24K -> 25->>30>>32->26K. No fevers. On po prednisone.  ? ?OOB sitting up in chair. Feels bad from afib. Symptomatic w/ palpitations. Husband at bedside.  ? ? ?LVAD INTERROGATION:  ?HeartMate II LVAD:  Flow 4.2  liters/min, speed 5450, power 3.8, PI 2.8. No PI events  VAD interrogated personally. Parameters stable. ? ? ?Objective:   ? ?Vital Signs:   ?Temp:  [97.8 ?F (36.6 ?C)-98.5 ?F (36.9 ?C)] 98.5 ?F (36.9 ?C) (03/22 0400) ?Pulse Rate:  [64-125] 108 (03/22 0600) ?Resp:  [21-38] 25 (03/22 0600) ?SpO2:  [97 %-98 %] 98 % (03/22 0600) ?Arterial Line BP: (73-97)/(61-82) 79/69 (03/22 0600) ?Weight:  [110 kg] 110 kg (03/22 0600) ?Last BM Date : 09/10/21 ?Mean arterial Pressure 70s-80s ? ?Intake/Output:  ? ?Intake/Output Summary (Last 24 hours) at 09/11/2021 0711 ?Last data filed at 09/11/2021 6073 ?Gross per 24 hour  ?Intake 2387.35 ml  ?Output 4375 ml  ?Net -1987.65 ml  ?  ?Physical exam ?CVP 11-13  ?General: sitting up in chair, SOB w/ conversation  ?HEENT: normal  ?Neck: supple. JVD 112 cm  Carotids 2+ bilat; no bruits. No lymphadenopathy or thryomegaly appreciated. ?Cor: LVAD hum. Irregularly irregular rhythm and rate  ?Lungs:  decreased BS at the bases bilaterally   ?Abdomen: obese soft, nontender, non-distended. No hepatosplenomegaly. No bruits or masses. Good bowel sounds. Driveline site clean. Anchor in place.  ?Extremities: no cyanosis, clubbing, rash. 2+ edema ?Neuro: alert & oriented x 3. No focal deficits. Moves all 4 without problem  ? ? ?Telemetry: Afib w/ RVR 120s Personally reviewed ? ? ? ?Labs: ?Basic Metabolic Panel: ?Recent Labs  ?Lab 09/06/21 ?0355 09/06/21 ?0415 09/07/21 ?0147 09/07/21 ?7106 09/08/21 ?0141 09/08/21 ?0406 09/09/21 ?2694 09/09/21 ?1320 09/09/21 ?1718 09/09/21 ?2009 09/10/21 ?8546 09/10/21 ?2703 09/10/21 ?1200 09/10/21 ?1800 09/11/21 ?0407  ?NA 130*   < > 132*   < > 131*   < > 135   < >  --  134* 134* 135 134* 135 134*  ?K 3.8   < > 3.2*   < > 2.6*   < > 3.6   < >  --  3.6 3.6 3.1* 3.3* 3.6 3.1*  ?CL 99   < > 98   < > 95*   < > 99   < >  --  97*  --  95* 96* 96* 93*  ?CO2 24   < > 24   < > 24   < > 26   < >  --  26  --  '28 25 28 30  '$ ?GLUCOSE 105*   < > 86   < >  101*   < > 99   < >  --  113*  --  107* 114* 120* 110*  ?BUN 30*   < > 32*   < > 32*   < > 33*   < >  --  34*  --  36* 35* 34* 36*  ?CREATININE 0.94   < > 0.97   < > 0.84   < > 0.86   < >  --  0.93  --  0.89 0.93 0.96 1.00  ?CALCIUM 8.6*   < > 8.8*   < > 8.8*   < > 9.2   < >  --  9.3  --  9.2 9.0 9.4 9.4  ?MG 2.4  --  2.1   < > 2.1  --  1.7  --  2.5*  --   --  2.0 1.8  --  2.1  ?PHOS 3.2  --  2.5  --  2.3*  --  2.8  --   --   --   --  3.8  --   --   --   ? < > = values in this interval not displayed.  ? ? ?Liver Function Tests: ?Recent Labs  ?Lab 09/05/21 ?0400 09/06/21 ?0355  ?AST 85* 53*  ?ALT 27 25  ?ALKPHOS 44 60  ?BILITOT 2.1* 1.6*  ?PROT 4.8* 5.0*  ?ALBUMIN 2.8* 2.5*  ? ?No results for input(s): LIPASE, AMYLASE in the last 168 hours. ?No results for input(s): AMMONIA in the last 168 hours. ? ?CBC: ?Recent Labs  ?Lab 09/07/21 ?0147 09/07/21 ?2376 09/08/21 ?0408 09/09/21 ?0253 09/10/21 ?2831 09/10/21 ?5176 09/11/21 ?0407  ?WBC 23.9*  --  25.1*  30.7*  --  32.4* 26.2*  ?NEUTROABS 19.8*  --  22.1* 25.0*  --  26.9* 22.8*  ?HGB 8.4*   < > 9.5* 9.6* 10.5* 9.5* 9.7*  ?HCT 25.6*   < > 28.9* 29.9* 31.0* 30.5* 31.1*  ?MCV 88.6  --  87.8 89.0  --  88.9 89.9  ?PLT 111*  --  176 240  --  343 435*  ? < > = values in this interval not displayed.  ? ? ?INR: ?Recent Labs  ?Lab 09/07/21 ?0147 09/08/21 ?0408 09/09/21 ?1607 09/10/21 ?3710 09/11/21 ?0407  ?INR 3.3* 2.5* 2.9* 2.2* 1.8*  ? ? ?Other results: ? ? ?Imaging: ?DG Chest Port 1 View ? ?Result Date: 09/10/2021 ?CLINICAL DATA:  LVAD.  History of CHF, defibrillator. EXAM: PORTABLE CHEST 1 VIEW COMPARISON:  Portable chest yesterday at 5:11 a.m. FINDINGS: 5:16 a.m., 09/10/2021. Left chest AICD and wiring, LVAD, and right PICC line are unchanged. Right PICC again terminates in the right atrium. Heart is enlarged. The mediastinal configuration is stable. Central vascular prominence and mild central edema continue to be noted as well as a small right pleural effusion and asymmetric opacity of the right lower lung field consistent with atelectasis or consolidation. Elevation of the right hemidiaphragm is also unchanged. Remainder of the lungs are clear. There is stable overall aeration. IMPRESSION: Stable atelectasis or airspace disease right lower lung field, with cardiomegaly, perihilar vascular congestion and mild central edema. Overall aeration, small right pleural effusion and mild central edema are unchanged. Electronically Signed   By: Telford Nab M.D.   On: 09/10/2021 06:35   ? ? ?Medications:   ? ? ?Scheduled Medications: ? (feeding supplement) PROSource Plus  60 mL Oral TID BM  ? sodium chloride   Intravenous Once  ? sodium chloride   Intravenous Once  ?  aspirin EC  81 mg Oral Daily  ? bisacodyl  10 mg Oral Daily  ? Chlorhexidine Gluconate Cloth  6 each Topical Daily  ? Chlorhexidine Gluconate Cloth  6 each Topical Daily  ? digoxin  0.0625 mg Oral Daily  ? feeding supplement  1 Container Oral BID WC  ? insulin  aspart  0-24 Units Subcutaneous TID WC & HS  ? methimazole  10 mg Oral Daily  ? methimazole  5 mg Oral QPM  ? pantoprazole  40 mg Oral Daily  ? polyethylene glycol  17 g Oral Daily  ? potassium chloride  40 mEq Oral Once  ? potassium chloride  40 mEq Oral Once  ? potassium chloride  40 mEq Oral Once  ? predniSONE  20 mg Oral Q breakfast  ? quiniDINE gluconate  324 mg Oral BID  ? rosuvastatin  40 mg Oral QHS  ? senna-docusate  2 tablet Oral BID  ? sildenafil  20 mg Oral TID  ? sodium chloride flush  10-40 mL Intracatheter Q12H  ? spironolactone  25 mg Oral Daily  ? warfarin  2.5 mg Oral ONCE-1600  ? Warfarin - Physician Dosing Inpatient   Does not apply q1600  ? ? ?Infusions: ? sodium chloride Stopped (09/05/21 1205)  ? sodium chloride    ? sodium chloride Stopped (09/09/21 0758)  ? sodium chloride    ? amiodarone 60 mg/hr (09/11/21 0600)  ? furosemide (LASIX) 200 mg in dextrose 5% 100 mL ('2mg'$ /mL) infusion 10 mg/hr (09/11/21 0600)  ? lactated ringers    ? lactated ringers Stopped (09/05/21 1010)  ? potassium chloride 10 mEq (09/11/21 0707)  ? potassium chloride    ? ? ?PRN Medications: ?sodium chloride, Place/Maintain arterial line **AND** sodium chloride, ALPRAZolam, midazolam, morphine injection, ondansetron (ZOFRAN) IV, oxyCODONE, sodium chloride flush, sodium chloride flush, sodium chloride flush, traMADol ? ? ? ?Plan/Discussion:   ? ? 1. Acute on chronic systolic HF: Nonischemic cardiomyopathy.  She has a Medtronic ICD. Cause for cardiomyopathy is uncertain, initially identified peri-partum (after son's birth), so cannot rule out peri-partum CMP.  On and off, she has had frequent PVCs.   RHC in 8/15 showed normal filling pressures and preserved cardiac output. Echo in 4/22 with EF 25%, severe LV dilation.  CPX (8/22) with mild-moderate HF limitation.  Echo this admission with EF < 20% with severe dilation, normal RV size with mildly decreased systolic function, moderate-severe functional MR with dilated IVC. She  was admitted with volume overload, NYHA class IV symptoms and low output. HCO3 18 and creatinine 1.5 on admit, co-ox 47%. Milrinone 0.25 started + lasix gtt.  I also suspect hyperthyroidism is playing a role in he

## 2021-09-11 NOTE — Progress Notes (Signed)
Occupational Therapy Treatment ?Patient Details ?Name: [name] ?MRN: [id number] ?DOB: [birth year] ?Today's Date: 09/11/2021 ? ? ?History of present illness 51yo female adm 3/6 with acute on chronic heart failure exacerbation. Impella placed  3/9. LVAD HM 3 implanted 09/03/21. 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 ?  ?OT comments ? Patient with fair progress toward all patient focused goals.  Patient see in conjunction with PT this date due to afternoon appointment.  The patient was able to increase her mobility distance, see PT note, but continues to experience anxiety, decreased activity tolerance, and frequent breaks.  Despite these deficits, she is setting new goals for herself, and actively participating.  Patient was able to walk PT/OT through switching drive line to battery pack.  OT to continue efforts in the acute setting, with AIR continuing to be recommended for post acute rehab prior to transitioning home.    ? ?Recommendations for follow up therapy are one component of a multi-disciplinary discharge planning process, led by the attending physician.  Recommendations may be updated based on patient status, additional functional criteria and insurance authorization. ?   ?Follow Up Recommendations ? Acute inpatient rehab (3hours/day)  ?  ?Assistance Recommended at Discharge Frequent or constant Supervision/Assistance  ?Patient can return home with the following ? A lot of help with walking and/or transfers;A lot of help with bathing/dressing/bathroom ?  ?Equipment Recommendations ?    ?  ?Recommendations for Other Services   ? ?  ?Precautions / Restrictions Precautions ?Precautions: Fall ?Precaution Booklet Issued: No ?Precaution Comments: LVAD, sternal ?Restrictions ?RUE Weight Bearing: Partial weight bearing ?LUE Weight Bearing: Partial weight bearing  ? ? ?  ? ?Mobility Bed Mobility ?  ?Bed Mobility: Sit to Supine ?  ?  ?  ?Sit to supine: Max assist ?  ?  ?Patient Response:  Anxious ? ?Transfers ?  ?  ?Transfers: Sit to/from Stand, Bed to chair/wheelchair/BSC ?Sit to Stand: Mod assist, +2 physical assistance ?  ?  ?Step pivot transfers: Min assist, +2 safety/equipment ?  ?  ?  ?  ?  ?Balance Overall balance assessment: Needs assistance ?Sitting-balance support: Feet supported, No upper extremity supported ?Sitting balance-Leahy Scale: Good ?  ?  ?Standing balance support: Bilateral upper extremity supported, During functional activity ?Standing balance-Leahy Scale: Poor ?  ?  ?  ?  ?  ?  ?  ?  ?  ?  ?  ?  ?   ? ?ADL either performed or assessed with clinical judgement  ? ?ADL   ?  ?  ?  ?  ?  ?  ?Lower Body Bathing: Maximal assistance;Sit to/from stand ?  ?Upper Body Dressing : Moderate assistance;Sitting ?  ?Lower Body Dressing: Maximal assistance;Sit to/from stand ?  ?  ?  ?  ?  ?  ?  ?  ?  ?  ? ?Extremity/Trunk Assessment Upper Extremity Assessment ?Upper Extremity Assessment: Generalized weakness ?RUE Deficits / Details: Improving edema ?LUE Deficits / Details: improving edema ?  ?Lower Extremity Assessment ?Lower Extremity Assessment: Defer to PT evaluation ?  ?Cervical / Trunk Assessment ?Cervical / Trunk Assessment: Normal ?  ? ?Vision   ?  ?  ?Perception   ?  ?Praxis   ?  ? ?Cognition Arousal/Alertness: Awake/alert ?Behavior During Therapy: Anxious ?Overall Cognitive Status: Within Functional Limits for tasks assessed ?  ?  ?  ?  ?  ?  ?  ?  ?  ?  ?  ?  ?  ?  ?  ?  ?  ?  ?  ?   ?   ? ?  ?   ? ? ?  ?    ? ? ?  Pertinent Vitals/ Pain       Pain Assessment ?Pain Assessment: No/denies pain ?Pain Intervention(s): Monitored during session ? ?   ?  ?  ?  ?  ?  ?  ?  ?  ?  ?  ?  ?  ?  ?  ?  ?  ?  ?  ? ?  ?    ?  ?  ?  ?   ? ?Frequency ? Min 2X/week  ? ? ? ? ?  ?Progress Toward Goals ? ?OT Goals(current goals can now be found in the care plan section) ? Progress towards OT goals: Progressing toward goals ? ?Acute Rehab OT Goals ?OT Goal Formulation: With patient ?Time For Goal Achievement:  09/14/21 ?Potential to Achieve Goals: Good  ?Plan Discharge plan remains appropriate   ? ?Co-evaluation ? ? ?   ?  ?  ?  ?  ? ?  ?AM-PAC OT "6 Clicks" Daily Activity     ?Outcome Measure ? ? Help from another person eating meals?: None ?Help from another person taking care of personal grooming?: A Little ?Help from another person toileting, which includes using toliet, bedpan, or urinal?: A Lot ?Help from another person bathing (including washing, rinsing, drying)?: A Lot ?Help from another person to put on and taking off regular upper body clothing?: A Lot ?Help from another person to put on and taking off regular lower body clothing?: A Lot ?6 Click Score: 15 ? ?  ?End of Session Equipment Utilized During Treatment: Oxygen ? ?OT Visit Diagnosis: Other abnormalities of gait and mobility (R26.89);Muscle weakness (generalized) (M62.81) ?  ?Activity Tolerance Patient tolerated treatment well ?  ?Patient Left in bed;with call [place]/phone within reach;with nursing/sitter in room ?  ?Nurse Communication Mobility status ?  ? ?   ? ?Time: 6063-0160 ?OT Time Calculation (min): 43 min ? ?Charges: OT General Charges ?$OT Visit: 1 Visit ?OT Treatments ?$Therapeutic Activity: 8-22 mins ? ?09/11/2021 ? ?RP, OTR/L ? ?Acute Rehabilitation Services ? ?Office:  [phone] ? ? ?Landi Biscardi D Shajuan Musso ?09/11/2021, 12:35 PM ?

## 2021-09-11 NOTE — Plan of Care (Signed)
?  Problem: Cardiac: ?Goal: Ability to achieve and maintain adequate cardiopulmonary perfusion will improve ?Outcome: Progressing ?  ?Problem: Activity: ?Goal: Risk for activity intolerance will decrease ?Outcome: Progressing ?  ?Problem: Safety: ?Goal: Ability to remain free from injury will improve ?Outcome: Progressing ?  ?Problem: Pain Managment: ?Goal: General experience of comfort will improve ?Outcome: Progressing ?  ?Problem: Fluid Volume: ?Goal: Risk for excess fluid volume will decrease ?Outcome: Progressing ?  ?Problem: Cardiac: ?Goal: Ability to maintain an adequate cardiac output will improve ?Outcome: Progressing ?  ?Problem: Nutrition: ?Goal: Adequate nutrition will be maintained ?Outcome: Not Progressing ?  ?

## 2021-09-11 NOTE — Progress Notes (Signed)
Speed  Flow  PI  Power  LVIDD  AI  Aortic opening MR  TR  Septum  RV  VTI (>18cm)  ?5400 4.5  3.3 4.2 6.61 mild 5/5  none  trace Pulling right WNL ?   ?5500 ? 4.5 3.2 4.2 6.71 mild 3/5 none trace Pulling right WNL   ? ?5600 4.8 3.0 4.3 6.48 mild 3/5 none trace Slight pull right to midline    ? ?             ? ?             ? ?             ? ?Doppler MAP: not documented ?Arterial BP: 105/89 (96) ?  ?Ramp ECHO performed at bedside per Dr [name]. ? ?At completion of ramp study, patients primary controller programmed:  ?Fixed speed: 5600 ?Low speed limit: 5300 ? ? ?[name] RN ?VAD Coordinator  ?Office: [phone]  ?24/7 Pager: [phone]  ? ?

## 2021-09-11 NOTE — Progress Notes (Signed)
?  Echocardiogram ?2D Echocardiogram has been performed. ? ?[name] ?09/11/2021, 1:43 PM ?

## 2021-09-11 NOTE — Progress Notes (Addendum)
HeartMate 3 Rounding Note ? ?Postop day  #8 HeartMate 3 Implantation March 14 ? ?Subjective:   ? ?Back in nsr with  decreasing intermittent afib ?Milrinone now weaned off ?Wt down with lasix drip= walked in hallway with PT  twice today ?Had Echo ramp study- speed increased to 5600 rpm to optimize pump performance. Patient feels better at higher pump speed ?INR down to 1.8, 2.5 mg coumadin ordered ? ?LVAD INTERROGATION:  ?HeartMate 3LVAD:  Flow 4.5 liters/min, speed 5600, power 4.8 PI 3.0.  Controller  intact.  ? ?Objective:   ? ?Vital Signs:   ?Temp:  [97.9 ?F (36.6 ?C)-98.5 ?F (36.9 ?C)] 97.9 ?F (36.6 ?C) (03/22 7829) ?Pulse Rate:  [77-125] 108 (03/22 0600) ?Resp:  [21-31] 26 (03/22 1400) ?SpO2:  [97 %-98 %] 98 % (03/22 0600) ?Arterial Line BP: (73-108)/(59-89) 91/80 (03/22 1400) ?Weight:  [110 kg] 110 kg (03/22 0600) ?Last BM Date : 09/10/21 ?Mean arterial Pressure 80-85 mm Hg ? ?Intake/Output:  ? ?Intake/Output Summary (Last 24 hours) at 09/11/2021 1632 ?Last data filed at 09/11/2021 1400 ?Gross per 24 hour  ?Intake 2153.85 ml  ?Output 3175 ml  ?Net -1021.15 ml  ?  ? ?Physical Exam: ?General:  Well appearing. No resp difficulty, mild tachypnea. Sternal incision dry ?HEENT: normal ?Neck: supple. JVP . 2+ carotids  no bruits. No lymphadenopathy or thryomegaly appreciated. L neck drain for Impella 5.5 out and incision healing ?Cor: Mechanical heart sounds with LVAD hum present. ?Lungs: clear ?Abdomen: soft, nontender, nondistended. No hepatosplenomegaly. No bruits or masses. Good bowel sounds. ?Extremities: no cyanosis, clubbing, rash, edema ?Neuro: alert & orientedx3, cranial nerves grossly intact. moves all 4 extremities w/o difficulty. Affect pleasant ? ?Telemetry: sinus tach ? ?Labs: ?Basic Metabolic Panel: ?Recent Labs  ?Lab 09/06/21 ?0355 09/06/21 ?0415 09/07/21 ?0147 09/07/21 ?5621 09/08/21 ?0141 09/08/21 ?0406 09/09/21 ?3086 09/09/21 ?1320 09/09/21 ?1718 09/09/21 ?2009 09/10/21 ?5784 09/10/21 ?1200  09/10/21 ?1800 09/11/21 ?0407 09/11/21 ?1454  ?NA 130*   < > 132*   < > 131*   < > 135   < >  --    < > 135 134* 135 134* 136  ?K 3.8   < > 3.2*   < > 2.6*   < > 3.6   < >  --    < > 3.1* 3.3* 3.6 3.1* 4.6  ?CL 99   < > 98   < > 95*   < > 99   < >  --    < > 95* 96* 96* 93* 98  ?CO2 24   < > 24   < > 24   < > 26   < >  --    < > '28 25 28 30 28  '$ ?GLUCOSE 105*   < > 86   < > 101*   < > 99   < >  --    < > 107* 114* 120* 110* 108*  ?BUN 30*   < > 32*   < > 32*   < > 33*   < >  --    < > 36* 35* 34* 36* 37*  ?CREATININE 0.94   < > 0.97   < > 0.84   < > 0.86   < >  --    < > 0.89 0.93 0.96 1.00 1.02*  ?CALCIUM 8.6*   < > 8.8*   < > 8.8*   < > 9.2   < >  --    < > 9.2  9.0 9.4 9.4 9.3  ?MG 2.4  --  2.1   < > 2.1  --  1.7  --  2.5*  --  2.0 1.8  --  2.1 2.1  ?PHOS 3.2  --  2.5  --  2.3*  --  2.8  --   --   --  3.8  --   --   --   --   ? < > = values in this interval not displayed.  ? ? ?Liver Function Tests: ?Recent Labs  ?Lab 09/05/21 ?0400 09/06/21 ?0355  ?AST 85* 53*  ?ALT 27 25  ?ALKPHOS 44 60  ?BILITOT 2.1* 1.6*  ?PROT 4.8* 5.0*  ?ALBUMIN 2.8* 2.5*  ? ?No results for input(s): LIPASE, AMYLASE in the last 168 hours. ?No results for input(s): AMMONIA in the last 168 hours. ? ?CBC: ?Recent Labs  ?Lab 09/07/21 ?0147 09/07/21 ?2542 09/08/21 ?0408 09/09/21 ?0253 09/10/21 ?7062 09/10/21 ?3762 09/11/21 ?0407  ?WBC 23.9*  --  25.1* 30.7*  --  32.4* 26.2*  ?NEUTROABS 19.8*  --  22.1* 25.0*  --  26.9* 22.8*  ?HGB 8.4*   < > 9.5* 9.6* 10.5* 9.5* 9.7*  ?HCT 25.6*   < > 28.9* 29.9* 31.0* 30.5* 31.1*  ?MCV 88.6  --  87.8 89.0  --  88.9 89.9  ?PLT 111*  --  176 240  --  343 435*  ? < > = values in this interval not displayed.  ? ? ?INR: ?Recent Labs  ?Lab 09/07/21 ?0147 09/08/21 ?0408 09/09/21 ?8315 09/10/21 ?1761 09/11/21 ?0407  ?INR 3.3* 2.5* 2.9* 2.2* 1.8*  ? ? ?Other results: ?EKG:  ? ?Imaging: ?DG Chest Port 1 View ? ?Result Date: 09/11/2021 ?CLINICAL DATA:  Decompensated heart failure. EXAM: PORTABLE CHEST 1 VIEW COMPARISON:   Portable chest yesterday at 5:16 a.m. FINDINGS: 5:15 a.m., 09/11/2021. LVAD, left chest AID and wire placements and right PICC tip in the right atrium are unaltered. Cardiomegaly is again noted with median sternotomy sutures intact. Perihilar vascular prominence is improved on today's exam and mild central interstitial edema is also improved. Coarse interstitial opacities in the right lower lung field are again noted alongside the elevated hemidiaphragm and could be atelectasis or consolidation. There is bilateral perihilar hazy opacity which is less prominent than yesterday, likely indicating ground-glass edema, underlying pneumonia not strictly excluded. There is a small right pleural effusion which is unchanged. The lungs are clear elsewhere. Stable mediastinum. Right axillary surgical clips. IMPRESSION: 1. Improving perihilar vascular congestion and central edema, with improving perihilar haziness. 2. Coarse interstitial opacities in right lower lung field could be atelectasis or consolidation, and were noted previously. 3. Small right pleural effusion also unchanged. 4. Stable appearance of support devices. Electronically Signed   By: Telford Nab M.D.   On: 09/11/2021 07:15  ? ?DG Chest Port 1 View ? ?Result Date: 09/10/2021 ?CLINICAL DATA:  LVAD.  History of CHF, defibrillator. EXAM: PORTABLE CHEST 1 VIEW COMPARISON:  Portable chest yesterday at 5:11 a.m. FINDINGS: 5:16 a.m., 09/10/2021. Left chest AICD and wiring, LVAD, and right PICC line are unchanged. Right PICC again terminates in the right atrium. Heart is enlarged. The mediastinal configuration is stable. Central vascular prominence and mild central edema continue to be noted as well as a small right pleural effusion and asymmetric opacity of the right lower lung field consistent with atelectasis or consolidation. Elevation of the right hemidiaphragm is also unchanged. Remainder of the lungs are clear. There is stable overall aeration. IMPRESSION:  Stable atelectasis or airspace disease right lower lung field, with cardiomegaly, perihilar vascular congestion and mild central edema. Overall aeration, small right pleural effusion and mild central edema are unchanged. Electronically Signed   By: Telford Nab M.D.   On: 09/10/2021 06:35   ? ? ?Medications:   ? ? ?Scheduled Medications: ? (feeding supplement) PROSource Plus  60 mL Oral TID BM  ? sodium chloride   Intravenous Once  ? sodium chloride   Intravenous Once  ? aspirin EC  81 mg Oral Daily  ? bisacodyl  10 mg Oral Daily  ? Chlorhexidine Gluconate Cloth  6 each Topical Daily  ? Chlorhexidine Gluconate Cloth  6 each Topical Daily  ? digoxin  0.0625 mg Oral Daily  ? feeding supplement  1 Container Oral BID WC  ? gabapentin  100 mg Oral TID  ? insulin aspart  0-24 Units Subcutaneous TID WC & HS  ? methimazole  10 mg Oral Daily  ? methimazole  5 mg Oral QPM  ? metolazone  2.5 mg Oral BID  ? mirtazapine  7.5 mg Oral QHS  ? pantoprazole  40 mg Oral Daily  ? polyethylene glycol  17 g Oral Daily  ? predniSONE  20 mg Oral Q breakfast  ? quiniDINE gluconate  324 mg Oral BID  ? rosuvastatin  40 mg Oral QHS  ? senna-docusate  2 tablet Oral BID  ? sildenafil  20 mg Oral TID  ? simethicone  80 mg Oral QID  ? sodium chloride flush  10-40 mL Intracatheter Q12H  ? spironolactone  25 mg Oral Daily  ? warfarin  2.5 mg Oral ONCE-1600  ? Warfarin - Physician Dosing Inpatient   Does not apply q1600  ? ? ?Infusions: ? sodium chloride Stopped (09/05/21 1205)  ? sodium chloride    ? sodium chloride Stopped (09/09/21 0758)  ? sodium chloride    ? amiodarone 60 mg/hr (09/11/21 1400)  ? furosemide (LASIX) 200 mg in dextrose 5% 100 mL ('2mg'$ /mL) infusion 12 mg/hr (09/11/21 1400)  ? lactated ringers    ? lactated ringers Stopped (09/05/21 1010)  ? ? ?PRN Medications: ?sodium chloride, Place/Maintain arterial line **AND** sodium chloride, ALPRAZolam, midazolam, morphine injection, ondansetron (ZOFRAN) IV, oxyCODONE, sodium chloride  flush, sodium chloride flush, sodium chloride flush, traMADol ? ? ?Assessment:  ?History of nonischemic cardiomyopathy with recent readmission for worsening heart failure and cardiogenic shock ? ?Preoperative bridge with I

## 2021-09-11 NOTE — TOC Benefit Eligibility Note (Signed)
Patient Advocate Encounter ?  ?Received notification fthat prior authorization for Sildenafil Citrate 20 mg tablets is required. ?  ?PA submitted on 09/11/2021 ?Key [id number] ?Status is pending ?   ? ? ? ?Lyndel Safe, CPhT ?Pharmacy Patient Advocate Specialist ?[facility] Patient Advocate Team ?Direct Number: [phone]  Fax: [phone]  ?

## 2021-09-11 NOTE — TOC Benefit Eligibility Note (Signed)
Patient Advocate Encounter ? ?Insurance verification completed.   ? ?The patient is currently admitted and upon discharge could be taking sildenafil (Revatio) 20 mg tablet. ? ?Prior Authorization Required ? ?The patient is insured through [employer]  ? ? ? ?Lyndel Safe, CPhT ?Pharmacy Patient Advocate Specialist ?[facility] Patient Advocate Team ?Direct Number: [phone]  Fax: [phone] ? ? ? ? ? ?  ?

## 2021-09-12 ENCOUNTER — Other Ambulatory Visit: Payer: Self-pay

## 2021-09-12 DIAGNOSIS — R570 Cardiogenic shock: Secondary | ICD-10-CM

## 2021-09-12 DIAGNOSIS — Z95811 Presence of heart assist device: Secondary | ICD-10-CM

## 2021-09-12 DIAGNOSIS — I5043 Acute on chronic combined systolic (congestive) and diastolic (congestive) heart failure: Secondary | ICD-10-CM | POA: Diagnosis not present

## 2021-09-12 DIAGNOSIS — I5023 Acute on chronic systolic (congestive) heart failure: Secondary | ICD-10-CM | POA: Diagnosis not present

## 2021-09-12 DIAGNOSIS — I428 Other cardiomyopathies: Secondary | ICD-10-CM

## 2021-09-12 LAB — COOXEMETRY PANEL
Carboxyhemoglobin: 1.9 % — ABNORMAL HIGH (ref 0.5–1.5)
Methemoglobin: <0.7 % (ref 0.0–1.5)
O2 Saturation: 69.5 %
Total hemoglobin: 9.7 g/dL — ABNORMAL LOW (ref 12.0–16.0)

## 2021-09-12 LAB — MAGNESIUM
Magnesium: 1.8 mg/dL (ref 1.7–2.4)
Magnesium: 2.1 mg/dL (ref 1.7–2.4)

## 2021-09-12 LAB — GLUCOSE, CAPILLARY
Glucose-Capillary: 129 mg/dL — ABNORMAL HIGH (ref 70–99)
Glucose-Capillary: 95 mg/dL (ref 70–99)
Glucose-Capillary: 59 mg/dL — ABNORMAL LOW (ref 70–99)
Glucose-Capillary: 83 mg/dL (ref 70–99)
Glucose-Capillary: 42 mg/dL — CL (ref 70–99)

## 2021-09-12 LAB — BASIC METABOLIC PANEL
Anion gap: 12 (ref 5–15)
Anion gap: 12 (ref 5–15)
Anion gap: 13 (ref 5–15)
BUN: 41 mg/dL — ABNORMAL HIGH (ref 6–20)
BUN: 45 mg/dL — ABNORMAL HIGH (ref 6–20)
BUN: 41 mg/dL — ABNORMAL HIGH (ref 6–20)
CO2: 32 mmol/L (ref 22–32)
CO2: 28 mmol/L (ref 22–32)
CO2: 32 mmol/L (ref 22–32)
Calcium: 9.3 mg/dL (ref 8.9–10.3)
Calcium: 9.3 mg/dL (ref 8.9–10.3)
Calcium: 9.4 mg/dL (ref 8.9–10.3)
Chloride: 92 mmol/L — ABNORMAL LOW (ref 98–111)
Chloride: 96 mmol/L — ABNORMAL LOW (ref 98–111)
Chloride: 91 mmol/L — ABNORMAL LOW (ref 98–111)
Creatinine, Ser: 1.16 mg/dL — ABNORMAL HIGH (ref 0.44–1.00)
Creatinine, Ser: 1.37 mg/dL — ABNORMAL HIGH (ref 0.44–1.00)
Creatinine, Ser: 1.39 mg/dL — ABNORMAL HIGH (ref 0.44–1.00)
GFR, Estimated: 57 mL/min — ABNORMAL LOW (ref >60)
GFR, Estimated: 47 mL/min — ABNORMAL LOW (ref >60)
GFR, Estimated: 46 mL/min — ABNORMAL LOW (ref >60)
Glucose, Bld: 100 mg/dL — ABNORMAL HIGH (ref 70–99)
Glucose, Bld: 127 mg/dL — ABNORMAL HIGH (ref 70–99)
Glucose, Bld: 147 mg/dL — ABNORMAL HIGH (ref 70–99)
Potassium: 2.6 mmol/L — CL (ref 3.5–5.1)
Potassium: 4.5 mmol/L (ref 3.5–5.1)
Potassium: 3 mmol/L — ABNORMAL LOW (ref 3.5–5.1)
Sodium: 136 mmol/L (ref 135–145)
Sodium: 136 mmol/L (ref 135–145)
Sodium: 136 mmol/L (ref 135–145)

## 2021-09-12 LAB — CBC WITH DIFFERENTIAL/PLATELET
Abs Immature Granulocytes: 0 10*3/uL (ref 0.00–0.07)
Basophils Absolute: 0 10*3/uL (ref 0.0–0.1)
Basophils Relative: 0 %
Eosinophils Absolute: 0 10*3/uL (ref 0.0–0.5)
Eosinophils Relative: 0 %
HCT: 30.5 % — ABNORMAL LOW (ref 36.0–46.0)
Hemoglobin: 9.6 g/dL — ABNORMAL LOW (ref 12.0–15.0)
Lymphocytes Relative: 6 %
Lymphs Abs: 1.4 10*3/uL (ref 0.7–4.0)
MCH: 28.4 pg (ref 26.0–34.0)
MCHC: 31.5 g/dL (ref 30.0–36.0)
MCV: 90.2 fL (ref 80.0–100.0)
Monocytes Absolute: 1.2 10*3/uL — ABNORMAL HIGH (ref 0.1–1.0)
Monocytes Relative: 5 %
Neutro Abs: 20.5 10*3/uL — ABNORMAL HIGH (ref 1.7–7.7)
Neutrophils Relative %: 89 %
Platelets: 439 10*3/uL — ABNORMAL HIGH (ref 150–400)
RBC: 3.38 MIL/uL — ABNORMAL LOW (ref 3.87–5.11)
RDW: 22.2 % — ABNORMAL HIGH (ref 11.5–15.5)
WBC: 23 10*3/uL — ABNORMAL HIGH (ref 4.0–10.5)
nRBC: 0 /100 WBC
nRBC: 0.7 % — ABNORMAL HIGH (ref 0.0–0.2)

## 2021-09-12 LAB — DIGOXIN LEVEL: Digoxin Level: 0.4 ng/mL — ABNORMAL LOW (ref 0.8–2.0)

## 2021-09-12 LAB — PROTIME-INR
INR: 1.8 — ABNORMAL HIGH (ref 0.8–1.2)
Prothrombin Time: 20.9 seconds — ABNORMAL HIGH (ref 11.4–15.2)

## 2021-09-12 LAB — LACTATE DEHYDROGENASE: LDH: 533 U/L — ABNORMAL HIGH (ref 98–192)

## 2021-09-12 MED ORDER — ENSURE ENLIVE PO LIQD
237.0000 mL | Freq: Three times a day (TID) | ORAL | Status: DC
  Administered 2021-09-12 – 2021-09-16 (×7): 237 mL via ORAL

## 2021-09-12 MED ORDER — POTASSIUM CHLORIDE CRYS ER 10 MEQ PO TBCR
40.0000 meq | EXTENDED_RELEASE_TABLET | ORAL | Status: AC
  Administered 2021-09-12 (×2): 40 meq via ORAL
  Filled 2021-09-12: qty 4
  Filled 2021-09-12: qty 2

## 2021-09-12 MED ORDER — MAGNESIUM SULFATE 2 GM/50ML IV SOLN
2.0000 g | Freq: Once | INTRAVENOUS | Status: AC
  Administered 2021-09-12: 2 g via INTRAVENOUS
  Filled 2021-09-12: qty 50

## 2021-09-12 MED ORDER — WARFARIN SODIUM 2 MG PO TABS
4.0000 mg | ORAL_TABLET | Freq: Once | ORAL | Status: AC
  Administered 2021-09-12: 4 mg via ORAL
  Filled 2021-09-12: qty 2

## 2021-09-12 MED ORDER — POTASSIUM CHLORIDE CRYS ER 10 MEQ PO TBCR
40.0000 meq | EXTENDED_RELEASE_TABLET | Freq: Once | ORAL | Status: AC
  Administered 2021-09-12: 40 meq via ORAL
  Filled 2021-09-12: qty 4

## 2021-09-12 MED ORDER — POTASSIUM CHLORIDE 10 MEQ/50ML IV SOLN
10.0000 meq | INTRAVENOUS | Status: AC
  Administered 2021-09-12 (×4): 10 meq via INTRAVENOUS
  Filled 2021-09-12 (×4): qty 50

## 2021-09-12 MED ORDER — METOLAZONE 2.5 MG PO TABS
2.5000 mg | ORAL_TABLET | Freq: Two times a day (BID) | ORAL | Status: AC
  Administered 2021-09-12 (×2): 2.5 mg via ORAL
  Filled 2021-09-12 (×2): qty 1

## 2021-09-12 MED ORDER — POTASSIUM CHLORIDE CRYS ER 10 MEQ PO TBCR
40.0000 meq | EXTENDED_RELEASE_TABLET | ORAL | Status: AC
  Administered 2021-09-12 – 2021-09-13 (×2): 40 meq via ORAL
  Filled 2021-09-12 (×2): qty 4

## 2021-09-12 NOTE — Progress Notes (Signed)
EVENING ROUNDS NOTE : ? ?   ?[address] ?      [place] [address] ?            [phone]   ?              ?9 Days Post-Op ?Procedure(s) (LRB): ?INSERTION OF IMPLANTABLE LEFT VENTRICULAR ASSIST DEVICE/ Heartmate 3 (N/A) ?TRANSESOPHAGEAL ECHOCARDIOGRAM (TEE) (N/A) ?REMOVAL OF IMPELLA LEFT VENTRICULAR ASSIST DEVICE (N/A) ? ? ?Total Length of Stay:  LOS: 17 days  ?Events:   ?No events ? ? ? ?BP (!) 61/41   Pulse 100   Temp 98 ?F (36.7 ?C) (Axillary)   Resp (!) 29   Ht '5\' 3"'$  (1.6 m)   Wt 109.4 kg   SpO2 98%   BMI 42.73 kg/m?  ? ?CVP:  [8 mmHg-16 mmHg] 11 mmHg ? ?  ? ? sodium chloride Stopped (09/05/21 1205)  ? sodium chloride    ? sodium chloride Stopped (09/09/21 0758)  ? sodium chloride    ? amiodarone 60 mg/hr (09/12/21 1600)  ? furosemide (LASIX) 200 mg in dextrose 5% 100 mL ('2mg'$ /mL) infusion 12 mg/hr (09/12/21 1600)  ? lactated ringers    ? lactated ringers Stopped (09/05/21 1010)  ? ? ?I/O last 3 completed shifts: ?In: 3038.5 [P.O.:920; I.V.:1382.6; IV Piggyback:735.9] ?Out: 5835 [OPFYT:2446] ? ? ? ?  Latest Ref Rng & Units 09/12/2021  ?  4:02 AM 09/11/2021  ?  4:07 AM 09/10/2021  ?  3:44 AM  ?CBC  ?WBC 4.0 - 10.5 K/uL 23.0   26.2   32.4    ?Hemoglobin 12.0 - 15.0 g/dL 9.6   9.7   9.5    ?Hematocrit 36.0 - 46.0 % 30.5   31.1   30.5    ?Platelets 150 - 400 K/uL 439   435   343    ? ? ? ?  Latest Ref Rng & Units 09/12/2021  ? 12:10 PM 09/12/2021  ?  4:02 AM 09/11/2021  ?  2:54 PM  ?BMP  ?Glucose 70 - 99 mg/dL 127   100   108    ?BUN 6 - 20 mg/dL 45   41   37    ?Creatinine 0.44 - 1.00 mg/dL 1.37   1.16   1.02    ?Sodium 135 - 145 mmol/L 136   136   136    ?Potassium 3.5 - 5.1 mmol/L 4.5   2.6   4.6    ?Chloride 98 - 111 mmol/L 96   92   98    ?CO2 22 - 32 mmol/L 28   32   28    ?Calcium 8.9 - 10.3 mg/dL 9.3   9.3   9.3    ? ? ?ABG ?   ?Component Value Date/Time  ? PHART 7.487 (H) 09/10/2021 0039  ? PCO2ART 39.8 09/10/2021 0039  ? PO2ART 86 09/10/2021 0039  ? HCO3 30.1 (H) 09/10/2021 0039  ?  TCO2 31 09/10/2021 0039  ? ACIDBASEDEF 1.0 09/03/2021 1818  ? O2SAT 69.5 09/12/2021 0415  ? ? ? ? ? ?Miranda Bouillon, MD ?09/12/2021 4:07 PM ? ? ?

## 2021-09-12 NOTE — TOC Benefit Eligibility Note (Signed)
Patient Advocate Encounter ? ?Received notification that the request for prior authorization for Sidenafil Citrate 20 mg has been denied due to does not meet the definition of Medical Necessity found in the member's benefit booklet.   ?   ? ?Lyndel Safe, CPhT ?Pharmacy Patient Advocate Specialist ?[facility] Patient Advocate Team ?Direct Number: [phone]  Fax: [phone]  ?

## 2021-09-12 NOTE — Progress Notes (Signed)
K+ 3, replace with 43mq q2 x2 doses. ? ?MOnnie Boer PharmD, BCIDP, AAHIVP, CPP ?Infectious Disease Pharmacist ?09/12/2021 10:35 PM ? ? ?

## 2021-09-12 NOTE — Progress Notes (Addendum)
HeartMate 2 Rounding Note ? ?Subjective:   ? ?- 3/14: HM3 LVAD placement with removal of Impella 5.5  ?- 3/15: Speed increased to 5300 rpm. Given 1UPRBC. Febrile. Cultures obtained. Extubated.  ?- 3/16 VT paced out. On amio drip. Given 1UPRBCs . Given couple doses of IV lasix.  ?- 3/18 Rapid ALF. Bolused multiple times with amio and drip turned up to 60. Now back in NSR ?- 3/19 VAD Speed increased to 5400 (high PI)  ?- 3/20 Recurrent ALF. Bolused w/ amio. Milrinone discontinued ?- 3/22 Ramp echo, Fixed Speed 5600  ? ?Converted back to NSR yesterday and stayed in rhythm for most of the day. Reverted back to Afib w/ RVR this morning around 5:00 AM, followed by multiple PI events and low MAPs. K drop from 4.6>>2.6. Mg 1.8. Cards fellow ordered lasix gtt to be held.  K supp ordered.  ? ?Remains in Afib, 130s. MAPs improved, now 43s.  ? ?Lasix gtt on hold. Had 4.3L in UOP yesterday. Net negative 2.2L. Wt down 2 lb. CVP 16  ? ?Co-ox stable off milrinone, 70%. SCr trending up slightly, 0.96>>1.00>>1.16 ? ?LDH 498>>519>>534>>548>>533 ?INR 1.8  ? ?Now off abx. WBC 24K -> 25->>30>>32->26->23K. No fevers. On po prednisone.  ? ?OOB sitting up in chair. + palpitations and mild dyspnea. Appetite still not great. Remeron started yesterday.  ? ? ?LVAD INTERROGATION:  ?HeartMate II LVAD:  Flow 4.4  liters/min, speed 5600, power 4.2, PI 6.0. Multiple PI events.  VAD interrogated personally. Parameters stable. ? ? ?Objective:   ? ?Vital Signs:   ?Temp:  [97.9 ?F (36.6 ?C)-98.2 ?F (36.8 ?C)] 98.2 ?F (36.8 ?C) (03/23 0400) ?Resp:  [17-32] 28 (03/23 0710) ?Arterial Line BP: (64-108)/(55-89) 70/60 (03/23 0710) ?Weight:  [109.4 kg] 109.4 kg (03/23 0500) ?Last BM Date : 09/11/21 ?Mean arterial Pressure  70s  ? ?Intake/Output:  ? ?Intake/Output Summary (Last 24 hours) at 09/12/2021 0715 ?Last data filed at 09/12/2021 0700 ?Gross per 24 hour  ?Intake 2028.76 ml  ?Output 4260 ml  ?Net -2231.24 ml  ?  ?Physical exam ?CVP 16  ?General: sitting up  in chair, SOB w/ conversation  ?HEENT: normal  ?Neck: supple. JVD 14 cm  Carotids 2+ bilat; no bruits. No lymphadenopathy or thryomegaly appreciated. ?Cor: LVAD hum. Irregularly irregular rhythm and rate  ?Lungs: decreased BS at the bases bilaterally  + crackles Rt anteriorly  ?Abdomen: obese soft, nontender, non-distended. No hepatosplenomegaly. No bruits or masses. Good bowel sounds. Driveline site clean. Anchor in place.  ?Extremities: no cyanosis, clubbing, rash. 2+ edema ?Neuro: alert & oriented x 3. No focal deficits. Moves all 4 without problem  ? ? ?Telemetry: Afib w/ RVR 130s Personally reviewed ? ? ? ?Labs: ?Basic Metabolic Panel: ?Recent Labs  ?Lab 09/06/21 ?0355 09/06/21 ?0415 09/07/21 ?0147 09/07/21 ?1157 09/08/21 ?0141 09/08/21 ?0406 09/09/21 ?2620 09/09/21 ?1320 09/10/21 ?3559 09/10/21 ?1200 09/10/21 ?1800 09/11/21 ?0407 09/11/21 ?1454 09/12/21 ?0402  ?NA 130*   < > 132*   < > 131*   < > 135   < > 135 134* 135 134* 136 136  ?K 3.8   < > 3.2*   < > 2.6*   < > 3.6   < > 3.1* 3.3* 3.6 3.1* 4.6 2.6*  ?CL 99   < > 98   < > 95*   < > 99   < > 95* 96* 96* 93* 98 92*  ?CO2 24   < > 24   < > 24   < >  26   < > '28 25 28 30 28 '$ 32  ?GLUCOSE 105*   < > 86   < > 101*   < > 99   < > 107* 114* 120* 110* 108* 100*  ?BUN 30*   < > 32*   < > 32*   < > 33*   < > 36* 35* 34* 36* 37* 41*  ?CREATININE 0.94   < > 0.97   < > 0.84   < > 0.86   < > 0.89 0.93 0.96 1.00 1.02* 1.16*  ?CALCIUM 8.6*   < > 8.8*   < > 8.8*   < > 9.2   < > 9.2 9.0 9.4 9.4 9.3 9.3  ?MG 2.4  --  2.1   < > 2.1  --  1.7   < > 2.0 1.8  --  2.1 2.1 1.8  ?PHOS 3.2  --  2.5  --  2.3*  --  2.8  --  3.8  --   --   --   --   --   ? < > = values in this interval not displayed.  ? ? ?Liver Function Tests: ?Recent Labs  ?Lab 09/06/21 ?9935  ?AST 53*  ?ALT 25  ?ALKPHOS 60  ?BILITOT 1.6*  ?PROT 5.0*  ?ALBUMIN 2.5*  ? ?No results for input(s): LIPASE, AMYLASE in the last 168 hours. ?No results for input(s): AMMONIA in the last 168 hours. ? ?CBC: ?Recent Labs  ?Lab  09/08/21 ?0408 09/09/21 ?0253 09/10/21 ?7017 09/10/21 ?7939 09/11/21 ?0407 09/12/21 ?0402  ?WBC 25.1* 30.7*  --  32.4* 26.2* 23.0*  ?NEUTROABS 22.1* 25.0*  --  26.9* 22.8* 20.5*  ?HGB 9.5* 9.6* 10.5* 9.5* 9.7* 9.6*  ?HCT 28.9* 29.9* 31.0* 30.5* 31.1* 30.5*  ?MCV 87.8 89.0  --  88.9 89.9 90.2  ?PLT 176 240  --  343 435* 439*  ? ? ?INR: ?Recent Labs  ?Lab 09/08/21 ?0408 09/09/21 ?0253 09/10/21 ?0300 09/11/21 ?0407 09/12/21 ?0402  ?INR 2.5* 2.9* 2.2* 1.8* 1.8*  ? ? ?Other results: ? ? ?Imaging: ?DG Chest Port 1 View ? ?Result Date: 09/11/2021 ?CLINICAL DATA:  Decompensated heart failure. EXAM: PORTABLE CHEST 1 VIEW COMPARISON:  Portable chest yesterday at 5:16 a.m. FINDINGS: 5:15 a.m., 09/11/2021. LVAD, left chest AID and wire placements and right PICC tip in the right atrium are unaltered. Cardiomegaly is again noted with median sternotomy sutures intact. Perihilar vascular prominence is improved on today's exam and mild central interstitial edema is also improved. Coarse interstitial opacities in the right lower lung field are again noted alongside the elevated hemidiaphragm and could be atelectasis or consolidation. There is bilateral perihilar hazy opacity which is less prominent than yesterday, likely indicating ground-glass edema, underlying pneumonia not strictly excluded. There is a small right pleural effusion which is unchanged. The lungs are clear elsewhere. Stable mediastinum. Right axillary surgical clips. IMPRESSION: 1. Improving perihilar vascular congestion and central edema, with improving perihilar haziness. 2. Coarse interstitial opacities in right lower lung field could be atelectasis or consolidation, and were noted previously. 3. Small right pleural effusion also unchanged. 4. Stable appearance of support devices. Electronically Signed   By: Telford Nab M.D.   On: 09/11/2021 07:15  ? ?ECHOCARDIOGRAM LIMITED ? ?Result Date: 09/11/2021 ?   ECHOCARDIOGRAM REPORT   Patient Name:   [name] Date  of Exam: 09/11/2021 Medical Rec #:  [id number]    Height:       63.0 in Accession #:  [id number]   Weight:       242.5 lb Date of Birth:  [birth year]    BSA:          2.099 m? Patient Age:    51 years     BP:           0/0 mmHg Patient Gender: F            HR:           94 bpm. Exam Location:  Inpatient Procedure: Limited Echo, Limited Color Doppler and Cardiac Doppler Indications:    CHF-Acute Systolic T61.44  History:        Patient has prior history of Echocardiogram examinations, most                 recent 09/03/2021. Cardiomyopathy and CHF, Previous Myocardial                 Infarction, Arrythmias:Tachycardia; Risk Factors:Sleep Apnea and                 Hypertension.  Sonographer:    Bernadene Person RDCS Referring Phys: [place]  1. Ramp Study for HeartMate 3 LVAD.     Initial speed 5400 rpm: AoV open with every beat. Septum bowing into RV. Mild MR. LIVDD 6.6 cm     Final speed 5600 rpm: AoV opens every thrid beat. Trivial AI. Septum midline. No MR. LIVDD 6.5 cm.  2. Left ventricular ejection fraction, by estimation, is <20%. The left ventricle has severely decreased function. The left ventricle demonstrates global hypokinesis. The left ventricular internal cavity size was moderately to severely dilated. Left ventricular diastolic function could not be evaluated.  3. Right ventricular systolic function is severely reduced. The right ventricular size is moderately enlarged.  4. A small pericardial effusion is present. The pericardial effusion is posterior to the left ventricle.  5. The mitral valve is grossly normal. Trivial mitral valve regurgitation. No evidence of mitral stenosis.  6. The aortic valve is tricuspid. Aortic valve regurgitation is trivial. No aortic stenosis is present. FINDINGS  Left Ventricular Assist Device: Ramp Study for HeartMate 3 LVAD. Initial speed 5400 rpm: AoV open with every beat. Septum bowing into RV. Mild MR. LIVDD 6.6 cm Final speed 5600 rpm: AoV opens  every thrid beat. Trivial AI. Septum midline. No MR. LIVDD 6.5 cm. Left Ventricle: Left ventricular ejection fraction, by estimation, is <20%. The left ventricle has severely decreased function. The left ve

## 2021-09-12 NOTE — Progress Notes (Signed)
Physical Therapy Treatment ?Patient Details ?Name: [name] ?MRN: [id number] ?DOB: [birth year] ?Today's Date: 09/12/2021 ? ? ?History of Present Illness 51yo female adm 3/6 with acute on chronic heart failure exacerbation. Impella placed  3/9. LVAD HM 3 implanted 09/03/21. 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 ? ?  ?PT Comments  ? ? Pt is making great progress with mobility, ambulating up to ~96 ft in one bout this date. However, pt required an extended seated rest break due to fatigue caused by this extended gait bout. Pt requiring min-modAx2 to transfer to stand due to lower extremity weakness. Focused part of session on educating pt and her husband on sternal precautions (needs reminders) and on switching the LVAD batteries and safety precautions, like carrying extra equipment in bag with her, not taking both batteries off at the same time, and making sure cord in anchored before mobilizing. Will continue to follow acutely. Current recommendations remain appropriate. ?   ?Recommendations for follow up therapy are one component of a multi-disciplinary discharge planning process, led by the attending physician.  Recommendations may be updated based on patient status, additional functional criteria and insurance authorization. ? ?Follow Up Recommendations ? Acute inpatient rehab (3hours/day) ?  ?  ?Assistance Recommended at Discharge Frequent or constant Supervision/Assistance  ?Patient can return home with the following A lot of help with bathing/dressing/bathroom;Assistance with cooking/housework;Direct supervision/assist for financial management;A lot of help with walking and/or transfers;Assist for transportation;Help with stairs or ramp for entrance ?  ?Equipment Recommendations ? Rollator (4 wheels);BSC/3in1  ?  ?Recommendations for Other Services   ? ? ?  ?Precautions / Restrictions Precautions ?Precautions: Fall;Sternal ?Precaution Booklet Issued: No ?Precaution Comments: LVAD,  sternal, watch HR ?Restrictions ?Weight Bearing Restrictions: Yes ?Other Position/Activity Restrictions: sternal precautions  ?  ? ?Mobility ? Bed Mobility ?Overal bed mobility: Needs Assistance ?Bed Mobility: Sit to Supine ?  ?  ?  ?Sit to supine: Mod assist, +2 for safety/equipment, +2 for physical assistance ?  ?General bed mobility comments: Assist for legs and trunk back to supine. ?  ? ?Transfers ?Overall transfer level: Needs assistance ?Equipment used: 1 person hand held assist Harmon Pier walker) ?Transfers: Sit to/from Stand, Bed to chair/wheelchair/BSC ?Sit to Stand: Mod assist, +2 physical assistance, Min assist ?  ?Step pivot transfers: +2 safety/equipment, Min assist ?  ?  ?  ?General transfer comment: Pt cued for hand placement on lap with transfers sit <> stand. Requiring modAx2 to come to stand the first rep and then pt began to reach for eva walker before initiating stand on 2nd and 3rd attempts, therefore was unsuccessful in transfer attempts these times. Pt cued to hold onto therapist lightly and lean anteriorly, using bed pad pt was able to come to stand with minAx2 and then stand step to R recliner > bed. ?  ? ?Ambulation/Gait ?Ambulation/Gait assistance: Min assist, +2 safety/equipment ?Gait Distance (Feet): 96 Feet (x2 bouts of ~96 ft > ~2 ft) ?Assistive device: Ethelene Hal, 1 person hand held assist ?Gait Pattern/deviations: Step-through pattern, Decreased stride length, Trunk flexed, Wide base of support ?Gait velocity: decreased ?Gait velocity interpretation: 1.31 - 2.62 ft/sec, indicative of limited community ambulator ?  ?General Gait Details: Pt with slight trunk flexion as she fatigued, noted mild lateral trunk sway with wide BOS. MinA for balance but no LOB, chair follow for safety. ? ? ?Stairs ?  ?  ?  ?  ?  ? ? ?Wheelchair Mobility ?  ? ?Modified Rankin (  Stroke Patients Only) ?  ? ? ?  ?Balance Overall balance assessment: Needs assistance ?Sitting-balance support: Feet supported, No upper  extremity supported ?Sitting balance-Leahy Scale: Fair ?Sitting balance - Comments: Poor trunk control at times ?  ?Standing balance support: Bilateral upper extremity supported, During functional activity ?Standing balance-Leahy Scale: Poor ?Standing balance comment: Reliant on UE support ?  ?  ?  ?  ?  ?  ?  ?  ?  ?  ?  ?  ? ?  ?Cognition Arousal/Alertness: Awake/alert ?Behavior During Therapy: Anxious ?Overall Cognitive Status: Impaired/Different from baseline ?Area of Impairment: Memory ?  ?  ?  ?  ?  ?  ?  ?  ?  ?  ?Memory: Decreased recall of precautions ?  ?  ?  ?  ?General Comments: continued cues for sternal precautions ?  ?  ? ?  ?Exercises Other Exercises ?Other Exercises: guided battery switch with minA for LVAD ? ?  ?General Comments General comments (skin integrity, edema, etc.): HR 90s at rest, 100s with gait; pt requesting up to 6L O2 with gait at times, otherwise WNL on 2L; educated pt on checking LVAD that it is anchored prior to mobilizing, checking and making sure extra equipment is packed in bag and brought with her, and how to switch cords from wall battery to transport batteries with pt performing battery switch with minA ?  ?  ? ?Pertinent Vitals/Pain Pain Assessment ?Pain Assessment: Faces ?Faces Pain Scale: Hurts a little bit ?Pain Location: chest at incision ?Pain Descriptors / Indicators: Guarding, Sore, Grimacing, Operative site guarding ?Pain Intervention(s): Limited activity within patient's tolerance, Monitored during session, Repositioned  ? ? ?Home Living   ?  ?  ?  ?  ?  ?  ?  ?  ?  ?   ?  ?Prior Function    ?  ?  ?   ? ?PT Goals (current goals can now be found in the care plan section) Acute Rehab PT Goals ?Patient Stated Goal: to improve ?PT Goal Formulation: With patient/family ?Time For Goal Achievement: 09/20/21 ?Potential to Achieve Goals: Good ?Progress towards PT goals: Progressing toward goals ? ?  ?Frequency ? ? ? Min 4X/week ? ? ? ?  ?PT Plan Current plan remains  appropriate  ? ? ?Co-evaluation   ?  ?  ?  ?  ? ?  ?AM-PAC PT "6 Clicks" Mobility   ?Outcome Measure ? Help needed turning from your back to your side while in a flat bed without using bedrails?: A Lot ?Help needed moving from lying on your back to sitting on the side of a flat bed without using bedrails?: A Lot ?Help needed moving to and from a bed to a chair (including a wheelchair)?: A Little ?Help needed standing up from a chair using your arms (e.g., wheelchair or bedside chair)?: A Lot ?Help needed to walk in hospital room?: A Little ?Help needed climbing 3-5 steps with a railing? : Total ?6 Click Score: 13 ? ?  ?End of Session Equipment Utilized During Treatment: Oxygen ?Activity Tolerance: Patient tolerated treatment well ?Patient left: in bed;with nursing/sitter in room;with call Teti/phone within reach;with bed alarm set;with family/visitor present ?Nurse Communication: Mobility status ?PT Visit Diagnosis: Other abnormalities of gait and mobility (R26.89);Muscle weakness (generalized) (M62.81);Unsteadiness on feet (R26.81);Difficulty in walking, not elsewhere classified (R26.2) ?  ? ? ?Time: 1335-1416 ?PT Time Calculation (min) (ACUTE ONLY): 41 min ? ?Charges:  $Gait Training: 23-37 mins ?$Therapeutic Activity: 8-22 mins          ?          ? ?  Moishe Spice, PT, DPT ?Acute Rehabilitation Services  ?Pager: [phone] ?Office: [phone] ? ? ? ?Maretta Bees Pettis ?09/12/2021, 5:15 PM ? ?

## 2021-09-12 NOTE — Progress Notes (Addendum)
Nutrition Follow-up ? ?DOCUMENTATION CODES:  ? ?Obesity unspecified, Non-severe (moderate) malnutrition in context of chronic illness ? ?INTERVENTION:  ? ?Plan for Cortrak tomorrow; RN and this RD discussed with pt. ? ?Tube Feeding via Cortrak: ?Pivot 1.5 at 50 ml/hr ?This provide 113 g of protein, 1800 kcals, 912 mL of free water ? ?D/c Pro-Source ?D/C Boost Breeze ? ?Ensure Enlive po TID, each supplement provides 350 kcal and 20 grams of protein. ? ? ?NUTRITION DIAGNOSIS:  ? ?Moderate Malnutrition related to chronic illness as evidenced by moderate muscle depletion, mild fat depletion, energy intake < 75% for > or equal to 1 month. ? ?Being addressed via TF  ? ?GOAL:  ? ?Patient will meet greater than or equal to 90% of their needs ? ?Progressing ? ?MONITOR:  ? ?PO intake, Supplement acceptance, Labs, Weight trends ? ?REASON FOR ASSESSMENT:  ? ?Consult ?LVAD Eval ? ?ASSESSMENT:  ? ?51 yo female admitted with acute on chronic CHF. PMH includes NICM EF 20-25%, CKD 3, CVA, HTN, HLD ?  ?3/08 RHC: low cardiac output with elevated right/left filling pressure ?3/09 s/p Impella 5.5 placement ?3/10 Cortrak placed (tip in distal stomach) ?3/14 LVAD placed, Cortrak came out during surgery ?3/15 Extubated ? ?Pt vomited after eating a half of piece of sausage this AM. Pt did eat a banana.  ? ?Appetite remains poor. Pt continues to complain of early satiety. Pt reports that the food looks good on the plate and she wants to eat it but when she puts the food in her mouth it tastes like cardboard and is hard to swallow. ? ?Pt reports she can no longer make her self drink the Pro-Source with Lemonade. Pt does not like the Boost Breeze but does report she likes the Ensure Enlive in Chocolate. RD to modify orders. ? ?RD again discussed Cortrak replacement with patient.  ?Pt reports she is trying very hard to eat and to improve po intake but she "just cant do it." Pt reports she does not want a Cortrak but knows she needs it at this  point.  ?Pt agreeable to Cortrak at this time. Discussed with Dr. [name] and plan for Cortrak tomorrow.  ? ?Labs: reviewed ?Meds: ss novolog, remeron, prednisone, lasix ? ? ? ?Diet Order:   ?Diet Order   ? ?       ?  Diet regular Room service appropriate? Yes; Fluid consistency: Thin  Diet effective now       ?  ? ?  ?  ? ?  ? ? ?EDUCATION NEEDS:  ? ?Education needs have been addressed ? ?Skin:  Skin Assessment: Skin Integrity Issues: ?Skin Integrity Issues:: Incisions, Other (Comment) ?Incisions: closed incision to chest ?Other: new LVAD, driveline ? ?Last BM:  3/22 ? ?Height:  ? ?Ht Readings from Last 1 Encounters:  ?09/12/21 '5\' 3"'$  (1.6 m)  ? ? ?Weight:  ? ?Wt Readings from Last 1 Encounters:  ?09/12/21 109.4 kg  ? ? ?BMI:  Body mass index is 42.73 kg/m?. ? ?Estimated Nutritional Needs:  ? ?Kcal:  1800-2000 kcals ? ?Protein:  100-115 g ? ?Fluid:  2L ? ? ?Kerman Passey MS, RDN, LDN, CNSC ?Registered Dietitian III ?Clinical Nutrition ?RD Pager and On-Call Pager Number Located in [place]  ? ?

## 2021-09-12 NOTE — Progress Notes (Signed)
MAPs 58-60, CVP ~10. Pt flipped into Afib rate controlled around 5am, multiple PI events since that time. Pt has diuresed 2.6L overnight on Lasix gtt. ? ?Cards fellow Rudi Rummage notified, given order to pause Lasix at this time. ?

## 2021-09-12 NOTE — Progress Notes (Signed)
Inpatient Rehabilitation Admissions Coordinator  ? ?I continue to follow patient progress to determine timing to begin Auth for a possible CIR admit. Patient in agreement. ? ?Danne Baxter, RN, MSN ?Rehab Admissions Coordinator ?[phone] ?09/12/2021 12:22 PM ? ?

## 2021-09-12 NOTE — Progress Notes (Signed)
Critical K 2.6. Mag 1.8. ? ?Pt flipped into Afib rate 100s this AM (was SR 80s-90s).  ? ?Dr. [name] on unit already, notified, gave orders for 66mq KCL PO now and again in 1 hour. Also ordered 4 runs KCL IVPB and '2mg'$  Mag IVPB. ?

## 2021-09-12 NOTE — Progress Notes (Signed)
LVAD Coordinator Rounding Note: ? ?Admitted 08/26/21 due to acute on chronic systolic heart failure. Receiving ongoing treatment for hyperthyroidism with Methimazole and steroids. On 08/29/21 Impella 5.5 placed.  ? ?HM III LVAD implanted on 09/03/21 by Dr Prescott Gum under destination therapy criteria due to elevated BMI.  ? ?Pt sitting up in the recliner upon my arrival. States she feels better with increased pump speed. Pt will remain in recliner today as much as possible. Dressing change performed in the recliner this morning - see below for full note.  ? ?Reverted back to Afib w/ RVR this morning around 5:00 AM, followed by multiple PI events and low MAPs.Lasix gtt on hold. Pt back in NSR ? ?WBC trending down to 23 today; afebrile. Completed Cefepime for positive urine culture. She remains on methimazole and PO Prednisone for hyperthyroidism. Will need endocrinology f/u appt at discharge.  ? ?Husbands FMLA papers faxed to [employer] today. Copy in chart in VAD office. ? ?Vital signs: ?Temp: 98 ?HR: 97 NSR ?Doppler Pressure: not documented ?Arterial BP: 88/73 (80) ?O2 Sat: 97% 3 L  ?Wt: 243.2>244.7>248>242.6>242.5>241.2  lbs ? ?LVAD interrogation:  ?Speed: 5600 ?Flow: 4.9 ?Power: 4.3 w ?PI: 2.8 ? ?Alarms: none ?Events: 100+ for today ? ?Hematocrit: 31 ?Fixed speed: 5600 ?Low speed limit: 5300 ? ?Drive Line: Existing VAD dressing removed and site care performed using sterile technique. Drive line exit site cleaned with Chlora prep applicators x 2, RINSED WITH SALINE, allowed to dry, and gauze dressing with Silver strip applied. Exit site healing and unincorporated, the velour is fully implanted at exit site. 2 sutures intact. Moderate amount of serousangeneous drainage. No redness, tenderness, foul odor or rash noted. Drive line anchor secure. Pt will need daily dressing changes using daily kit per VAD coordinator or nurse champion. Next dressing change due: 09/13/21.  ? ? ? ?Labs:  ?LDH trend: [id number] ? ?INR trend:  0.8>1.6>2.9>2.2 ? ?WBC trend: 25.1>29.5>30.7>32.4 ? ?Hgb trend: 6.5>7.5>8.0>9.6>9.5 ? ?Anticoagulation Plan: ?-INR Goal: 2.0 - 2.5 ?-ASA Dose: 81 mg  ? ?Blood Products:  ?IntraOp: ? 4 PRBCs ? 4 FFP ? 2 Cryo ? 2 platelets ?DDAVP ? 1,400 cell saver ?Post Op: ?09/03/21: Factor 7 ? 1 PRBC ? 1 Platelet ?09/04/21: 1 PRBC ?09/05/21: 1 PRBC ? ?Device: ?-Medtronic  ?-Therapies: OFF ? 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 ?Respiratory: extubated 09/04/21  ? ?Infection: ?08/31/21>> blood cultures>> no growth 5 days; final ?09/04/21>> blood cultures>> no growth 5 days; final ?09/04/21>> sputum culture>> moderate WBC, no organisms seen; rare candida albicans ?09/04/21>> urine culture>> enterococcus faecalis; final ? ?Drips: ?Amiodarone 60 mg/hr ?Lasix 10 mg/hr ? ?Patient Education: ?No family at bedside. ?Encouraged pt to review HeartMate III Handbook, and pt discharge binder.  ? ?Plan/Recommendations:  ?1. Page VAD coordinators with VAD equipment and drive line concerns. ?2. Daily drive line dressing change per VAD coordinator or nurse champion using daily kit. ? ?Tanda Rockers RN ?VAD Coordinator  ?Office: [phone]  ?24/7 Pager: [phone]  ? ?  ?

## 2021-09-12 NOTE — Progress Notes (Signed)
HeartMate 3 Rounding Note ? ?Postop day  #9 HeartMate 3 Implantation March 14 ? ?Subjective:   ? ?Back in nsr with   intermittent afib related to low K from lasix drip ?Started on sildenifil ?Wt down with lasix drip ?Had Echo ramp study- speed increased to 5600 rpm  3-22 to optimize pump performance. Patient feels better at higher pump speed ?INR 1.8 , coumadin 4 mg ordered ? ?LVAD INTERROGATION:  ?HeartMate 3LVAD:  Flow 4.7 liters/min, speed 5600, power 4.8 PI 3.0.  Controller  intact.  ? ?Objective:   ? ?Vital Signs:   ?Temp:  [97.6 ?F (36.4 ?C)-98.2 ?F (36.8 ?C)] 97.6 ?F (36.4 ?C) (03/23 0744) ?Pulse Rate:  [91] 91 (03/23 0800) ?Resp:  [17-34] 31 (03/23 0800) ?Arterial Line BP: (64-108)/(55-89) 82/67 (03/23 0800) ?Weight:  [109.4 kg] 109.4 kg (03/23 0500) ?Last BM Date : 09/11/21 ?Mean arterial Pressure 65-75 mm Hg ? ?Intake/Output:  ? ?Intake/Output Summary (Last 24 hours) at 09/12/2021 0903 ?Last data filed at 09/12/2021 0800 ?Gross per 24 hour  ?Intake 1654.88 ml  ?Output 4060 ml  ?Net -2405.12 ml  ?  ? ?Physical Exam: ?General:  Well appearing. No resp difficulty, mild tachypnea. Sternal incision dry ?HEENT: normal ?Neck: supple. JVP . 2+ carotids  no bruits. No lymphadenopathy or thryomegaly appreciated. L neck drain for Impella 5.5 out and incision healing ?Cor: Mechanical heart sounds with LVAD hum present. ?Lungs: clear ?Abdomen: soft, nontender, nondistended. No hepatosplenomegaly. No bruits or masses. Good bowel sounds. ?Extremities: no cyanosis, clubbing, rash, edema ?Neuro: alert & orientedx3, cranial nerves grossly intact. moves all 4 extremities w/o difficulty. Affect pleasant ? ?Telemetry: sinus tach ? ?Labs: ?Basic Metabolic Panel: ?Recent Labs  ?Lab 09/06/21 ?0355 09/06/21 ?0415 09/07/21 ?0147 09/07/21 ?4098 09/08/21 ?0141 09/08/21 ?0406 09/09/21 ?1191 09/09/21 ?1320 09/10/21 ?4782 09/10/21 ?1200 09/10/21 ?1800 09/11/21 ?0407 09/11/21 ?1454 09/12/21 ?0402  ?NA 130*   < > 132*   < > 131*   < > 135    < > 135 134* 135 134* 136 136  ?K 3.8   < > 3.2*   < > 2.6*   < > 3.6   < > 3.1* 3.3* 3.6 3.1* 4.6 2.6*  ?CL 99   < > 98   < > 95*   < > 99   < > 95* 96* 96* 93* 98 92*  ?CO2 24   < > 24   < > 24   < > 26   < > '28 25 28 30 28 '$ 32  ?GLUCOSE 105*   < > 86   < > 101*   < > 99   < > 107* 114* 120* 110* 108* 100*  ?BUN 30*   < > 32*   < > 32*   < > 33*   < > 36* 35* 34* 36* 37* 41*  ?CREATININE 0.94   < > 0.97   < > 0.84   < > 0.86   < > 0.89 0.93 0.96 1.00 1.02* 1.16*  ?CALCIUM 8.6*   < > 8.8*   < > 8.8*   < > 9.2   < > 9.2 9.0 9.4 9.4 9.3 9.3  ?MG 2.4  --  2.1   < > 2.1  --  1.7   < > 2.0 1.8  --  2.1 2.1 1.8  ?PHOS 3.2  --  2.5  --  2.3*  --  2.8  --  3.8  --   --   --   --   --   ? < > =  values in this interval not displayed.  ? ? ?Liver Function Tests: ?Recent Labs  ?Lab 09/06/21 ?1884  ?AST 53*  ?ALT 25  ?ALKPHOS 60  ?BILITOT 1.6*  ?PROT 5.0*  ?ALBUMIN 2.5*  ? ?No results for input(s): LIPASE, AMYLASE in the last 168 hours. ?No results for input(s): AMMONIA in the last 168 hours. ? ?CBC: ?Recent Labs  ?Lab 09/08/21 ?0408 09/09/21 ?0253 09/10/21 ?1660 09/10/21 ?6301 09/11/21 ?0407 09/12/21 ?0402  ?WBC 25.1* 30.7*  --  32.4* 26.2* 23.0*  ?NEUTROABS 22.1* 25.0*  --  26.9* 22.8* 20.5*  ?HGB 9.5* 9.6* 10.5* 9.5* 9.7* 9.6*  ?HCT 28.9* 29.9* 31.0* 30.5* 31.1* 30.5*  ?MCV 87.8 89.0  --  88.9 89.9 90.2  ?PLT 176 240  --  343 435* 439*  ? ? ?INR: ?Recent Labs  ?Lab 09/08/21 ?0408 09/09/21 ?0253 09/10/21 ?6010 09/11/21 ?0407 09/12/21 ?0402  ?INR 2.5* 2.9* 2.2* 1.8* 1.8*  ? ? ?Other results: ?EKG:  ? ?Imaging: ?DG Chest Port 1 View ? ?Result Date: 09/11/2021 ?CLINICAL DATA:  Decompensated heart failure. EXAM: PORTABLE CHEST 1 VIEW COMPARISON:  Portable chest yesterday at 5:16 a.m. FINDINGS: 5:15 a.m., 09/11/2021. LVAD, left chest AID and wire placements and right PICC tip in the right atrium are unaltered. Cardiomegaly is again noted with median sternotomy sutures intact. Perihilar vascular prominence is improved on today's exam  and mild central interstitial edema is also improved. Coarse interstitial opacities in the right lower lung field are again noted alongside the elevated hemidiaphragm and could be atelectasis or consolidation. There is bilateral perihilar hazy opacity which is less prominent than yesterday, likely indicating ground-glass edema, underlying pneumonia not strictly excluded. There is a small right pleural effusion which is unchanged. The lungs are clear elsewhere. Stable mediastinum. Right axillary surgical clips. IMPRESSION: 1. Improving perihilar vascular congestion and central edema, with improving perihilar haziness. 2. Coarse interstitial opacities in right lower lung field could be atelectasis or consolidation, and were noted previously. 3. Small right pleural effusion also unchanged. 4. Stable appearance of support devices. Electronically Signed   By: Telford Nab M.D.   On: 09/11/2021 07:15  ? ?ECHOCARDIOGRAM LIMITED ? ?Result Date: 09/11/2021 ?   ECHOCARDIOGRAM REPORT   Patient Name:   [name] Date of Exam: 09/11/2021 Medical Rec #:  [id number]    Height:       63.0 in Accession #:    [id number]   Weight:       242.5 lb Date of Birth:  March 17, 1971    BSA:          2.099 m? Patient Age:    50 years     BP:           0/0 mmHg Patient Gender: F            HR:           94 bpm. Exam Location:  Inpatient Procedure: Limited Echo, Limited Color Doppler and Cardiac Doppler Indications:    CHF-Acute Systolic B76.28  History:        Patient has prior history of Echocardiogram examinations, most                 recent 09/03/2021. Cardiomyopathy and CHF, Previous Myocardial                 Infarction, Arrythmias:Tachycardia; Risk Factors:Sleep Apnea and                 Hypertension.  Sonographer:    Judson Roch  [place] RDCS Referring Phys: [place]  1. Ramp Study for HeartMate 3 LVAD.     Initial speed 5400 rpm: AoV open with every beat. Septum bowing into RV. Mild MR. LIVDD 6.6 cm     Final speed  5600 rpm: AoV opens every thrid beat. Trivial AI. Septum midline. No MR. LIVDD 6.5 cm.  2. Left ventricular ejection fraction, by estimation, is <20%. The left ventricle has severely decreased function. The left ventricle demonstrates global hypokinesis. The left ventricular internal cavity size was moderately to severely dilated. Left ventricular diastolic function could not be evaluated.  3. Right ventricular systolic function is severely reduced. The right ventricular size is moderately enlarged.  4. A small pericardial effusion is present. The pericardial effusion is posterior to the left ventricle.  5. The mitral valve is grossly normal. Trivial mitral valve regurgitation. No evidence of mitral stenosis.  6. The aortic valve is tricuspid. Aortic valve regurgitation is trivial. No aortic stenosis is present. FINDINGS  Left Ventricular Assist Device: Ramp Study for HeartMate 3 LVAD. Initial speed 5400 rpm: AoV open with every beat. Septum bowing into RV. Mild MR. LIVDD 6.6 cm Final speed 5600 rpm: AoV opens every thrid beat. Trivial AI. Septum midline. No MR. LIVDD 6.5 cm. Left Ventricle: Left ventricular ejection fraction, by estimation, is <20%. The left ventricle has severely decreased function. The left ventricle demonstrates global hypokinesis. The left ventricular internal cavity size was moderately to severely dilated. There is no left ventricular hypertrophy. Left ventricular diastolic function could not be evaluated due to nondiagnostic images. Left ventricular diastolic function could not be evaluated. Right Ventricle: The right ventricular size is moderately enlarged. No increase in right ventricular wall thickness. Right ventricular systolic function is severely reduced. Left Atrium: Left atrial size was normal in size. Right Atrium: Right atrial size was normal in size. Pericardium: A small pericardial effusion is present. The pericardial effusion is posterior to the left ventricle. Mitral Valve: The  mitral valve is grossly normal. Trivial mitral valve regurgitation. No evidence of mitral valve stenosis. Tricuspid Valve: The tricuspid valve is grossly normal. Tricuspid valve regurgitation is not

## 2021-09-13 ENCOUNTER — Inpatient Hospital Stay (HOSPITAL_COMMUNITY): Payer: BC Managed Care – PPO

## 2021-09-13 DIAGNOSIS — I5023 Acute on chronic systolic (congestive) heart failure: Secondary | ICD-10-CM | POA: Diagnosis not present

## 2021-09-13 DIAGNOSIS — R570 Cardiogenic shock: Secondary | ICD-10-CM

## 2021-09-13 DIAGNOSIS — I428 Other cardiomyopathies: Secondary | ICD-10-CM

## 2021-09-13 DIAGNOSIS — Z515 Encounter for palliative care: Secondary | ICD-10-CM | POA: Diagnosis not present

## 2021-09-13 DIAGNOSIS — Z7189 Other specified counseling: Secondary | ICD-10-CM | POA: Diagnosis not present

## 2021-09-13 LAB — BASIC METABOLIC PANEL
Anion gap: 12 (ref 5–15)
Anion gap: 11 (ref 5–15)
BUN: 42 mg/dL — ABNORMAL HIGH (ref 6–20)
BUN: 38 mg/dL — ABNORMAL HIGH (ref 6–20)
CO2: 35 mmol/L — ABNORMAL HIGH (ref 22–32)
CO2: 35 mmol/L — ABNORMAL HIGH (ref 22–32)
Calcium: 9.1 mg/dL (ref 8.9–10.3)
Calcium: 9.3 mg/dL (ref 8.9–10.3)
Chloride: 89 mmol/L — ABNORMAL LOW (ref 98–111)
Chloride: 91 mmol/L — ABNORMAL LOW (ref 98–111)
Creatinine, Ser: 1.23 mg/dL — ABNORMAL HIGH (ref 0.44–1.00)
Creatinine, Ser: 1.33 mg/dL — ABNORMAL HIGH (ref 0.44–1.00)
GFR, Estimated: 54 mL/min — ABNORMAL LOW (ref >60)
GFR, Estimated: 49 mL/min — ABNORMAL LOW (ref >60)
Glucose, Bld: 169 mg/dL — ABNORMAL HIGH (ref 70–99)
Glucose, Bld: 110 mg/dL — ABNORMAL HIGH (ref 70–99)
Potassium: 3.2 mmol/L — ABNORMAL LOW (ref 3.5–5.1)
Potassium: 3.8 mmol/L (ref 3.5–5.1)
Sodium: 136 mmol/L (ref 135–145)
Sodium: 137 mmol/L (ref 135–145)

## 2021-09-13 LAB — CBC WITH DIFFERENTIAL/PLATELET
Abs Immature Granulocytes: 0.93 10*3/uL — ABNORMAL HIGH (ref 0.00–0.07)
Basophils Absolute: 0.1 10*3/uL (ref 0.0–0.1)
Basophils Relative: 0 %
Eosinophils Absolute: 0 10*3/uL (ref 0.0–0.5)
Eosinophils Relative: 0 %
HCT: 30 % — ABNORMAL LOW (ref 36.0–46.0)
Hemoglobin: 9.1 g/dL — ABNORMAL LOW (ref 12.0–15.0)
Immature Granulocytes: 4 %
Lymphocytes Relative: 7 %
Lymphs Abs: 1.8 10*3/uL (ref 0.7–4.0)
MCH: 28 pg (ref 26.0–34.0)
MCHC: 30.3 g/dL (ref 30.0–36.0)
MCV: 92.3 fL (ref 80.0–100.0)
Monocytes Absolute: 1.5 10*3/uL — ABNORMAL HIGH (ref 0.1–1.0)
Monocytes Relative: 6 %
Neutro Abs: 21.4 10*3/uL — ABNORMAL HIGH (ref 1.7–7.7)
Neutrophils Relative %: 83 %
Platelets: 429 10*3/uL — ABNORMAL HIGH (ref 150–400)
RBC: 3.25 MIL/uL — ABNORMAL LOW (ref 3.87–5.11)
RDW: 22.2 % — ABNORMAL HIGH (ref 11.5–15.5)
Smear Review: ADEQUATE
WBC: 25.8 10*3/uL — ABNORMAL HIGH (ref 4.0–10.5)
nRBC: 0.3 % — ABNORMAL HIGH (ref 0.0–0.2)

## 2021-09-13 LAB — PROTIME-INR
INR: 2.3 — ABNORMAL HIGH (ref 0.8–1.2)
Prothrombin Time: 25 seconds — ABNORMAL HIGH (ref 11.4–15.2)

## 2021-09-13 LAB — COOXEMETRY PANEL
Carboxyhemoglobin: 2.2 % — ABNORMAL HIGH (ref 0.5–1.5)
Methemoglobin: 0.7 % (ref 0.0–1.5)
O2 Saturation: 69.7 %
Total hemoglobin: 9.6 g/dL — ABNORMAL LOW (ref 12.0–16.0)

## 2021-09-13 LAB — GLUCOSE, CAPILLARY
Glucose-Capillary: 105 mg/dL — ABNORMAL HIGH (ref 70–99)
Glucose-Capillary: 108 mg/dL — ABNORMAL HIGH (ref 70–99)
Glucose-Capillary: 112 mg/dL — ABNORMAL HIGH (ref 70–99)
Glucose-Capillary: 125 mg/dL — ABNORMAL HIGH (ref 70–99)
Glucose-Capillary: 133 mg/dL — ABNORMAL HIGH (ref 70–99)
Glucose-Capillary: 137 mg/dL — ABNORMAL HIGH (ref 70–99)
Glucose-Capillary: 37 mg/dL — CL (ref 70–99)

## 2021-09-13 LAB — MAGNESIUM
Magnesium: 2 mg/dL (ref 1.7–2.4)
Magnesium: 2 mg/dL (ref 1.7–2.4)

## 2021-09-13 LAB — POTASSIUM: Potassium: 3.7 mmol/L (ref 3.5–5.1)

## 2021-09-13 LAB — PHOSPHORUS: Phosphorus: 3.5 mg/dL (ref 2.5–4.6)

## 2021-09-13 LAB — LACTATE DEHYDROGENASE: LDH: 530 U/L — ABNORMAL HIGH (ref 98–192)

## 2021-09-13 MED ORDER — POTASSIUM CHLORIDE 20 MEQ PO PACK
40.0000 meq | PACK | Freq: Once | ORAL | Status: AC
  Administered 2021-09-13: 40 meq
  Filled 2021-09-13: qty 2

## 2021-09-13 MED ORDER — POTASSIUM CHLORIDE CRYS ER 10 MEQ PO TBCR
40.0000 meq | EXTENDED_RELEASE_TABLET | Freq: Once | ORAL | Status: AC
  Administered 2021-09-13: 40 meq via ORAL
  Filled 2021-09-13: qty 4

## 2021-09-13 MED ORDER — POTASSIUM CHLORIDE CRYS ER 10 MEQ PO TBCR
40.0000 meq | EXTENDED_RELEASE_TABLET | ORAL | Status: DC
  Administered 2021-09-13: 40 meq via ORAL
  Filled 2021-09-13: qty 4

## 2021-09-13 MED ORDER — SENNOSIDES-DOCUSATE SODIUM 8.6-50 MG PO TABS: 2.0000 | ORAL_TABLET | Freq: Two times a day (BID) | ORAL | Status: DC | PRN

## 2021-09-13 MED ORDER — SALINE SPRAY 0.65 % NA SOLN
1.0000 | NASAL | Status: DC | PRN
  Administered 2021-09-13 – 2021-09-19 (×2): 1 via NASAL
  Filled 2021-09-13: qty 44

## 2021-09-13 MED ORDER — WARFARIN SODIUM 2.5 MG PO TABS
2.5000 mg | ORAL_TABLET | Freq: Once | ORAL | Status: AC
  Administered 2021-09-13: 2.5 mg via ORAL
  Filled 2021-09-13: qty 1

## 2021-09-13 MED ORDER — POTASSIUM CHLORIDE 10 MEQ/50ML IV SOLN
10.0000 meq | INTRAVENOUS | Status: AC
  Administered 2021-09-13 (×4): 10 meq via INTRAVENOUS
  Filled 2021-09-13 (×4): qty 50

## 2021-09-13 MED ORDER — PROSOURCE TF PO LIQD: 45.0000 mL | Freq: Two times a day (BID) | ORAL | Status: DC

## 2021-09-13 MED ORDER — PIVOT 1.5 CAL PO LIQD
1000.0000 mL | ORAL | Status: DC
Start: 2021-09-13 — End: 2021-09-18
  Administered 2021-09-13 – 2021-09-18 (×7): 1000 mL
  Filled 2021-09-13 (×3): qty 1000

## 2021-09-13 MED ORDER — AMIODARONE HCL 200 MG PO TABS
400.0000 mg | ORAL_TABLET | Freq: Two times a day (BID) | ORAL | Status: DC
  Administered 2021-09-13 (×2): 400 mg via ORAL
  Filled 2021-09-13 (×2): qty 2

## 2021-09-13 MED ORDER — METOLAZONE 2.5 MG PO TABS
2.5000 mg | ORAL_TABLET | Freq: Once | ORAL | Status: AC
  Administered 2021-09-13: 2.5 mg via ORAL
  Filled 2021-09-13: qty 1

## 2021-09-13 NOTE — Progress Notes (Signed)
? ?  Palliative Medicine Inpatient Follow Up Note ? ? ?HPI: ?51 y.o. female   admitted on 08/25/2021 with  ?[name] is a 51 y.o. 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 ?  ?3/7 Evaluation in process for LVAD implantation. ? ?Re-consulted on 3/24 for emotional support.  ? ?Today's Discussion (09/13/2021): ? ?*Please note that this is a verbal dictation therefore any spelling or grammatical errors are due to the "[facility]" system interpretation. ? ?Chart reviewed inclusive of vital signs, progress notes, laboratory results, and diagnostic images.  ? ?I met with [name] and her spouse, [name] at bedside. They were speaking with the LVAD coordinator, [name] at this time. She was explaining the road that they are on and the possible modification that will be made in the future. She assured them that this is all par for the course.  ? ?Reviewed briefly my role and how we act as an extra layer of support and guidance for patients and families.  ? ?Per [name] she has been feeling better these past two days. She was feeling defeated and exceptionally overwhelmed prior though these feelings are not improving.  ? ?Created space and opportunity for patient to explore thoughts feelings and fears regarding current medical situation. ? ?Questions and concerns addressed  ? ?Palliative Support Provided ? ?Objective Assessment: ?Vital Signs ?Vitals:  ? 09/13/21 1145 09/13/21 1200  ?Pulse:  90  ?Resp: 20 (!) 24  ?Temp:  97.9 ?F (36.6 ?C)  ?SpO2:    ? ? ?Intake/Output Summary (Last 24 hours) at 09/13/2021 1557 ?Last data filed at 09/13/2021 1200 ?Gross per 24 hour  ?Intake 1365.03 ml  ?Output 5300 ml  ?Net -3934.97 ml  ? ?Last Weight  Most recent update: 09/13/2021  7:54 AM  ? ? Weight  ?108 kg (238 lb 1.6 oz)  ?      ? ?   ? ?Gen:  Middle aged Pierz F in NAD ?HEENT: moist mucous membranes ?CV: Irregular rate and rhythm  ?PULM: 3LPM [place] ?ABD: soft/nontender  ?EXT: No edema  ?Neuro: Alert and oriented x3  ? ?SUMMARY OF RECOMMENDATIONS   ?Limited - No CPR ? ?Palliative support provided ? ?Appreciate [name] of Spiritual Care offering guidance to patient and her spouse ? ?Appreciate LVAD coordinator and MSW speaking to patient and her spouse ? ?PMT will continue to follow along ? ?MDM - High ?______________________________________________________________________________________ ?[name] ?[facility] Team ?Team Cell Phone: [phone] ?Please utilize secure chat with additional questions, if there is no response within 30 minutes please call the above phone number ? ?Palliative Medicine Team providers are available by phone from 7am to 7pm daily and can be reached through the team cell phone.  ?Should this patient require assistance outside of these hours, please call the patient's attending physician. ? ? ? ? ?

## 2021-09-13 NOTE — Progress Notes (Signed)
This chaplain responded to RN consult for spiritual care. The chaplain reviewed the chart notes and was updated by the Pt. RN-Elizabeth before the visit. ? ?The Pt. is sitting up in the bedside recliner and the Pt. husband-Jamie is at the bedside. The chaplain opened the space for the Pt., and [name] to celebrate today's accomplishments.  The chaplain understands the Pt. and [name] expected a much quicker recovery time. Through reflective listening with the chaplain the Pt. voiced her willingness to now take one day at time, and take a step away from the pre-LVAD preconceptions and expectations. The chaplain understands the Pt. is relying on her faith and God's timing in the care of "Baby", the Pt. new name for her pump. ? ?The chaplain listened as [name] explained the previous day's emotional response "to watching the Pt.  struggle to breath and the medical team asking the Pt.  to get up and walk." [name] declined community support and referenced a new peacefulness in his faith and a continued hope. Jamie's goal for the Pt. is "to take a walk down the street with the Pt."  The chaplain understands the Pt. LVAD pump care is very important to Jewel Baize may request education twice.   ? ?The Pt. has two adult children whom prefer to communicate with the Pt. through [place] and a community [name] is updating as needed. ? ?This chaplain and the Pt. Agreed on F/U spiritual care. ? ?Chaplain Sallyanne Kuster ?[phone] ?

## 2021-09-13 NOTE — Procedures (Signed)
Cortrak  Tube Type:  Cortrak - 43 inches Tube Location:  Left nare Initial Placement:  Stomach Secured by: Bridle Technique Used to Measure Tube Placement:  Marking at nare/corner of mouth Cortrak Secured At:  70 cm  Cortrak Tube Team Note:  Consult received to place a Cortrak feeding tube.   X-ray is required, abdominal x-ray has been ordered by the Cortrak team. Please confirm tube placement before using the Cortrak tube.   If the tube becomes dislodged please keep the tube and contact the Cortrak team at [email or web address] (password [email or web address]) for replacement.  If after hours and replacement cannot be delayed, place a NG tube and confirm placement with an abdominal x-ray.    Musa Rewerts MS, RD, LDN Please refer to AMION for RD and/or RD on-call/weekend/after hours pager   

## 2021-09-13 NOTE — Progress Notes (Signed)
Inpatient Diabetes Program Recommendations ? ?AACE/ADA: New Consensus Statement on Inpatient Glycemic Control (2015) ? ?Target Ranges:  Prepandial:   less than 140 mg/dL ?     Peak postprandial:   less than 180 mg/dL (1-2 hours) ?     Critically ill patients:  140 - 180 mg/dL  ? ?Lab Results  ?Component Value Date  ? GLUCAP 137 (H) 09/13/2021  ? HGBA1C 5.1 08/27/2021  ? ? ?Review of Glycemic Control ? Latest Reference Range & Units 09/11/21 21:45 09/12/21 07:42 09/12/21 11:13 09/12/21 17:31 09/12/21 18:05  ?Glucose-Capillary 70 - 99 mg/dL 112 (H) 129 (H) ?Novolog 2 units 95 59 (L) 83  ?(H): Data is abnormally high ?(L): Data is abnormally low ? ?Diabetes history: DM2 ?Outpatient Diabetes medications: Farxiga 10 mg daily ?Current orders for Inpatient glycemic control: Novolog 0-24 units qid ? ?Inpatient Diabetes Program Recommendations:   ?Hypoglycemia post correction ?-Decrease Novolog correction to 0-6 units tid + hs 0-5 units ? ?Thank you, ?Nani Gasser Chevy Virgo, RN, MSN, CDE  ?Diabetes Coordinator ?Inpatient Glycemic Control Team ?Team Pager [phone] (8am-5pm) ?09/13/2021 12:22 PM ? ? ? ? ?

## 2021-09-13 NOTE — Progress Notes (Addendum)
HeartMate 2 Rounding Note ? ?Subjective:   ? ?- 3/14: HM3 LVAD placement with removal of Impella 5.5  ?- 3/15: Speed increased to 5300 rpm. Given 1UPRBC. Febrile. Cultures obtained. Extubated.  ?- 3/16 VT paced out. On amio drip. Given 1UPRBCs . Given couple doses of IV lasix.  ?- 3/18 Rapid ALF. Bolused multiple times with amio and drip turned up to 60. Now back in NSR ?- 3/19 VAD Speed increased to 5400 (high PI)  ?- 3/20 Recurrent ALF. Bolused w/ amio. Milrinone discontinued ?- 3/22 Ramp echo, Fixed Speed 5600  ? ?Co-ox stable at 70% off milrinone. ? ?CVP 10-12. Diuresing well with lasix gtt + metolazone 2.5 mg BID. 4L UOP yesterday. PO intake minimal. Scr stable at 1.23. K 3.2.  ? ?MAPs 70s-80s. ? ?Back in SR 90s ? ?LDH 498>>519>>534>>548>>533 ?INR 2.3. ? ?Now off abx. WBC 24K>25>30>32>26>23>26K. No fevers. On po prednisone.  ? ?Appetite remains poor. Getting CorTrak today. ? ?Walked in halls yesterday. ? ? ?LVAD INTERROGATION:  ?HeartMate II LVAD:  Flow 4.9  liters/min, speed 5600, power 4, PI 3.4. Numerous PI events yesterday. No low flow alarms. VAD interrogated personally. Parameters stable. ? ? ?Objective:   ? ?Vital Signs:   ?Temp:  [97.6 ?F (36.4 ?C)-98.4 ?F (36.9 ?C)] 97.9 ?F (36.6 ?C) (03/24 0650) ?Pulse Rate:  [91-100] 96 (03/23 1600) ?Resp:  [18-34] 24 (03/23 2200) ?SpO2:  [98 %] 98 % (03/23 1600) ?Arterial Line BP: (68-115)/(58-93) 75/65 (03/24 0500) ?Last BM Date : 09/13/21 ?Mean arterial Pressure  70s - 80s ? ?Intake/Output:  ? ?Intake/Output Summary (Last 24 hours) at 09/13/2021 0658 ?Last data filed at 09/13/2021 0515 ?Gross per 24 hour  ?Intake 647.99 ml  ?Output 4175 ml  ?Net -3527.01 ml  ?  ?Physical exam ? ?General:  Lying comfortably in bed. ?HEENT: normal ?Neck: supple. JVP 10-12. Carotids 2+ bilat; no bruits.  ?Cor: PMI nondisplaced. Regular rate & rhythm. No rubs, gallops or murmurs. Sternal incision stable. ?Lungs: clear anteriorly ?Abdomen: soft, nontender, nondistended. No  hepatosplenomegaly.  ?Extremities: no cyanosis, clubbing, rash, edema ?Neuro: alert & orientedx3, cranial nerves grossly intact. moves all 4 extremities w/o difficulty. Affect pleasant ? ? ? ?Telemetry: SR 90s (personally reviewed) ? ? ? ?Labs: ?Basic Metabolic Panel: ?Recent Labs  ?Lab 09/07/21 ?0147 09/07/21 ?7741 09/08/21 ?0141 09/08/21 ?0406 09/09/21 ?2878 09/09/21 ?1320 09/10/21 ?6767 09/10/21 ?1200 09/11/21 ?0407 09/11/21 ?1454 09/12/21 ?0402 09/12/21 ?1210 09/12/21 ?2146 09/13/21 ?0424  ?NA 132*   < > 131*   < > 135   < > 135   < > 134* 136 136 136 136 136  ?K 3.2*   < > 2.6*   < > 3.6   < > 3.1*   < > 3.1* 4.6 2.6* 4.5 3.0* 3.2*  ?CL 98   < > 95*   < > 99   < > 95*   < > 93* 98 92* 96* 91* 89*  ?CO2 24   < > 24   < > 26   < > 28   < > 30 28 32 28 32 35*  ?GLUCOSE 86   < > 101*   < > 99   < > 107*   < > 110* 108* 100* 127* 147* 169*  ?BUN 32*   < > 32*   < > 33*   < > 36*   < > 36* 37* 41* 45* 41* 42*  ?CREATININE 0.97   < > 0.84   < > 0.86   < >  0.89   < > 1.00 1.02* 1.16* 1.37* 1.39* 1.23*  ?CALCIUM 8.8*   < > 8.8*   < > 9.2   < > 9.2   < > 9.4 9.3 9.3 9.3 9.4 9.1  ?MG 2.1   < > 2.1  --  1.7   < > 2.0   < > 2.1 2.1 1.8  --  2.1 2.0  ?PHOS 2.5  --  2.3*  --  2.8  --  3.8  --   --   --   --   --   --   --   ? < > = values in this interval not displayed.  ? ? ?Liver Function Tests: ?No results for input(s): AST, ALT, ALKPHOS, BILITOT, PROT, ALBUMIN in the last 168 hours. ? ?No results for input(s): LIPASE, AMYLASE in the last 168 hours. ?No results for input(s): AMMONIA in the last 168 hours. ? ?CBC: ?Recent Labs  ?Lab 09/09/21 ?0253 09/10/21 ?7371 09/10/21 ?0626 09/11/21 ?0407 09/12/21 ?0402 09/13/21 ?0424  ?WBC 30.7*  --  32.4* 26.2* 23.0* 25.8*  ?NEUTROABS 25.0*  --  26.9* 22.8* 20.5* 21.4*  ?HGB 9.6* 10.5* 9.5* 9.7* 9.6* 9.1*  ?HCT 29.9* 31.0* 30.5* 31.1* 30.5* 30.0*  ?MCV 89.0  --  88.9 89.9 90.2 92.3  ?PLT 240  --  343 435* 439* 429*  ? ? ?INR: ?Recent Labs  ?Lab 09/09/21 ?0253 09/10/21 ?9485  09/11/21 ?0407 09/12/21 ?0402 09/13/21 ?0424  ?INR 2.9* 2.2* 1.8* 1.8* 2.3*  ? ? ?Other results: ? ? ?Imaging: ?DG Chest Port 1 View ? ?Result Date: 09/13/2021 ?CLINICAL DATA:  Follow-up LVAD. Denies chest pain and shortness of breath. Prior history CHF. EXAM: PORTABLE CHEST 1 VIEW COMPARISON:  09/11/2021 at 5:15 a.m. FINDINGS: 5:13 a.m., 09/13/2021. Again noted and unchanged are and LVAD obscuring the lateral left base, left chest AID with 2 right ventricular wires, and right PICC terminating in the right atrium. The heart stably enlarged. There are intact median sternotomy sutures. There is a stable appearance perihilar vascular prominence, coarse interstitial changes in the right lower lung field and small right pleural effusion, and perihilar hazy opacities which could be edema and/or pneumonia. There is no new, further or worsening lung opacity. Overall aeration seems unchanged.  Stable mediastinum. IMPRESSION: Stable overall aeration with no interval change in bilateral lung opacities and perihilar vascular prominence. Electronically Signed   By: Telford Nab M.D.   On: 09/13/2021 06:38  ? ?ECHOCARDIOGRAM LIMITED ? ?Result Date: 09/11/2021 ?   ECHOCARDIOGRAM REPORT   Patient Name:   [name] Date of Exam: 09/11/2021 Medical Rec #:  [id number]    Height:       63.0 in Accession #:    [id number]   Weight:       242.5 lb Date of Birth:  07-Oct-1970    BSA:          2.099 m? Patient Age:    2 years     BP:           0/0 mmHg Patient Gender: F            HR:           94 bpm. Exam Location:  Inpatient Procedure: Limited Echo, Limited Color Doppler and Cardiac Doppler Indications:    CHF-Acute Systolic J69.67  History:        Patient has prior history of Echocardiogram examinations, most  recent 09/03/2021. Cardiomyopathy and CHF, Previous Myocardial                 Infarction, Arrythmias:Tachycardia; Risk Factors:Sleep Apnea and                 Hypertension.  Sonographer:    Bernadene Person RDCS  Referring Phys: [place]  1. Ramp Study for HeartMate 3 LVAD.     Initial speed 5400 rpm: AoV open with every beat. Septum bowing into RV. Mild MR. LIVDD 6.6 cm     Final speed 5600 rpm: AoV opens every thrid beat. Trivial AI. Septum midline. No MR. LIVDD 6.5 cm.  2. Left ventricular ejection fraction, by estimation, is <20%. The left ventricle has severely decreased function. The left ventricle demonstrates global hypokinesis. The left ventricular internal cavity size was moderately to severely dilated. Left ventricular diastolic function could not be evaluated.  3. Right ventricular systolic function is severely reduced. The right ventricular size is moderately enlarged.  4. A small pericardial effusion is present. The pericardial effusion is posterior to the left ventricle.  5. The mitral valve is grossly normal. Trivial mitral valve regurgitation. No evidence of mitral stenosis.  6. The aortic valve is tricuspid. Aortic valve regurgitation is trivial. No aortic stenosis is present. FINDINGS  Left Ventricular Assist Device: Ramp Study for HeartMate 3 LVAD. Initial speed 5400 rpm: AoV open with every beat. Septum bowing into RV. Mild MR. LIVDD 6.6 cm Final speed 5600 rpm: AoV opens every thrid beat. Trivial AI. Septum midline. No MR. LIVDD 6.5 cm. Left Ventricle: Left ventricular ejection fraction, by estimation, is <20%. The left ventricle has severely decreased function. The left ventricle demonstrates global hypokinesis. The left ventricular internal cavity size was moderately to severely dilated. There is no left ventricular hypertrophy. Left ventricular diastolic function could not be evaluated due to nondiagnostic images. Left ventricular diastolic function could not be evaluated. Right Ventricle: The right ventricular size is moderately enlarged. No increase in right ventricular wall thickness. Right ventricular systolic function is severely reduced. Left Atrium: Left atrial size  was normal in size. Right Atrium: Right atrial size was normal in size. Pericardium: A small pericardial effusion is present. The pericardial effusion is posterior to the left ventricle. Mitral Valve: The mitral valve is grossly

## 2021-09-13 NOTE — Progress Notes (Addendum)
0700 - Report received from [place], Therapist, sports.  All questions answered.  Safety checks performed.  All lines and drips verified. ?Hand hygiene performed before/after each pt contact. ?Ria Comment, PA rounded. ?0800 - Assessment & Rx ?0830 - Dr. Darcey Nora rounded. ?74 - Dr. [name] rounded.  Dr. [name] requested pt eat a banana with every meal tray since pt is able to eat them without problem.  Dietary updated and placed a note.  Lunch order updated to include a banana. ?0900 - Pt w BM.  Pt bathed with soap and CHG. ?0930 - Ebony Hail arrived and changed pt's LVAD dsg. ?[place], RD and Myriam Jacobson, RD arrived to place pt's Cortrack.  Cortrack placed without difficulty.  Order for CXR placed. ?1125 - Palliative paged to speak with pt d/t feeling of being overwhelmed.  Curt Bears, NP advised that she would be by later to speak with pt. ?1200 - Richard, OT came to work with pt.  Pt able to walk 160 ft. ?1230 - Pt's husband arrived. ?[place], Chaplain rounded on pt. ?1500 - Ebony Hail arrived to discuss LVAD care with pt and her husband.  All questions answered. ?[place], Palliative NP rounded. ?1600 - CVP line changed. ?[place], RT changed pt's a-line and dsg. ?1730 - Pt assisted to [facility].  2 whole K+ tablets from earlier Rx admission noted in pt's stool. ?1800 - Pt able to walk approximately 70 ft in the hall. ?[place] notified of pt's unabsorbed K+ tablets.  K+ to be changed to powder.  Pt agreed to powder as long as it was administered via Cortrack.  Pharmacy updated with pt's request. ?7858 - Dr. Darcey Nora rounded.  Pt C/O blood clot in nare.  Dr. Darcey Nora ordered nasal spray to assist in removal. ?1900 - Report given to Ubaldo Glassing, [place].  All questions answered. ?

## 2021-09-13 NOTE — Progress Notes (Signed)
LVAD Coordinator Rounding Note: ? ?Admitted 08/26/21 due to acute on chronic systolic heart failure. Receiving ongoing treatment for hyperthyroidism with Methimazole and steroids. On 08/29/21 Impella 5.5 placed.  ? ?HM III LVAD implanted on 09/03/21 by Dr Prescott Gum under destination therapy criteria due to elevated BMI.  ? ?Pt finishing up bath in bed upon my arrival. Plan for pt to get to recliner after Core-Track placement this morning. She states she is feel good this morning, and is looking forward to getting out of bed.  ? ?WBC 25.8 today; afebrile. Completed Cefepime for positive urine culture. She remains on methimazole and PO Prednisone for hyperthyroidism. Will need endocrinology f/u appt at discharge.  ? ?Plan in place with 2H nurse champions to cover daily drive line dressing changes over the weekend.  ? ?See patient and caregiver education documented below.  ? ?Vital signs: ?Temp: 97.9 ?HR: 97 NSR ?Doppler Pressure: not documented ?Arterial BP: 82/73 (78) ?O2 Sat: 97% 3 L  ?Wt: 243.2>244.7>248>242.6>242.5>241.2>238.1  lbs ? ?LVAD interrogation:  ?Speed: 5600 ?Flow: 4.8 ?Power: 4.2 w ?PI: 3.2 ? ?Alarms: none ?Events: 4 PI events today; 100+ yesterday ? ?Hematocrit: 30 ?Fixed speed: 5600 ?Low speed limit: 5300 ? ?Drive Line: Existing VAD dressing removed and site care performed using sterile technique. Drive line exit site cleaned with Chlora prep applicators x 2, RINSED WITH SALINE, allowed to dry, and gauze dressing with Silver strip applied. Exit site healing and unincorporated, the velour is fully implanted at exit site. 2 sutures intact. Small amount of serosanguinous drainage. No redness, tenderness, foul odor or rash noted. Drive line anchor secure. Pt will need daily dressing changes using daily kit per VAD coordinator or nurse champion. Next dressing change due: 09/14/21.  ? ? ? ?Labs:  ?LDH trend: 391>485>519>534>530 ? ?INR trend: 0.8>1.6>2.9>2.2>2.3 ? ?WBC trend: 25.1>29.5>30.7>32.4>25.8 ? ?Hgb trend:  6.5>7.5>8.0>9.6>9.5>9.1 ? ?Anticoagulation Plan: ?-INR Goal: 2.0 - 2.5 ?-ASA Dose: 81 mg  ? ?Blood Products:  ?IntraOp: ? 4 PRBCs ? 4 FFP ? 2 Cryo ? 2 platelets ?DDAVP ? 1,400 cell saver ?Post Op: ?09/03/21: Factor 7 ? 1 PRBC ? 1 Platelet ?09/04/21: 1 PRBC ?09/05/21: 1 PRBC ? ?Device: ?-Medtronic  ?-Therapies: OFF ? 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 ?Respiratory: extubated 09/04/21  ? ?Infection: ?08/31/21>> blood cultures>> no growth 5 days; final ?09/04/21>> blood cultures>> no growth 5 days; final ?09/04/21>> sputum culture>> moderate WBC, no organisms seen; rare candida albicans ?09/04/21>> urine culture>> enterococcus faecalis; final ? ?Drips: ?Amiodarone 60 mg/hr ?Lasix 12 mg/hr ? ?Patient Education: ?Encouraged pt to review HeartMate III Handbook, and pt discharge binder.  ?Discussed Coumadin and INR management. Discussed need for consistency in Vitamin K rich foods. Pt interested in home INR machine when appropriate. ?Discussed nosebleed management ?Discussed cough/cold medications that may be used ?Briefly discussed home red folder flow-sheets (need to bring to clinic each visit, fill out daily) ?Practiced sterile glove don/doff with [name]. He successfully return demonstrated. He will need size 8 gloves. Provided him with a few pairs to practice with at home over the weekend.  ?Provided with information about VAD support group ? ?Plan/Recommendations:  ?1. Page VAD coordinators with VAD equipment and drive line concerns. ?2. Daily drive line dressing change per VAD coordinator or nurse champion using daily kit. ? ?[name] RN ?VAD Coordinator  ?Office: [phone]  ?24/7 Pager: [phone]  ? ? ?  ?

## 2021-09-13 NOTE — Progress Notes (Signed)
Pharmacy Electrolyte Replacement ? ?Recent Labs: ? ?Recent Labs  ?  09/13/21 ?1436  ?K 3.7  3.8  ?MG 2.0  ?PHOS 3.5  ?CREATININE 1.33*  ? ? ?Plan: KCL 48mq PO Q2H x 2 doses ?Repeat BMET at 2200 while on Lasix infusion ? ?Lanelle Lindo D. DMina Marble PharmD, BCPS, BCCCP ?09/13/2021, 4:37 PM ? ?

## 2021-09-13 NOTE — Progress Notes (Signed)
Occupational Therapy Treatment ?Patient Details ?Name: [name] ?MRN: [id number] ?DOB: Jul 05, 1970 ?Today's Date: 09/13/2021 ? ? ?History of present illness 51yo female adm 3/6 with acute on chronic heart failure exacerbation. Impella placed  3/9. LVAD HM 3 implanted 09/03/21. 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 ?  ?OT comments ? Patient with good progress toward patient focused goals.  Patient with less anxiety, improved precaution adherence, and needing only standing therapeutic rest break times two.  Patient able to walk the first loop in the H unit on RA.  Less fatigue expressed, and much better activity tolerance and control of breathing.  Patient extremely happy with her progress this date, can look to attempt 2 treatments per day in the upcoming week.  AIR continues to be recommended once medically cleared.    ? ?Recommendations for follow up therapy are one component of a multi-disciplinary discharge planning process, led by the attending physician.  Recommendations may be updated based on patient status, additional functional criteria and insurance authorization. ?   ?Follow Up Recommendations ? Acute inpatient rehab (3hours/day)  ?  ?Assistance Recommended at Discharge Frequent or constant Supervision/Assistance  ?Patient can return home with the following ? A lot of help with walking and/or transfers;A lot of help with bathing/dressing/bathroom ?  ?Equipment Recommendations ?    ?  ?Recommendations for Other Services   ? ?  ?Precautions / Restrictions Precautions ?Precautions: Fall;Sternal ?Precaution Comments: LVAD, sternal, watch HR ?Restrictions ?Weight Bearing Restrictions: Yes ?RUE Weight Bearing: Partial weight bearing ?LUE Weight Bearing: Partial weight bearing ?Other Position/Activity Restrictions: sternal precautions  ? ? ?  ? ?Mobility Bed Mobility ?  ?  ?  ?  ?  ?  ?  ?General bed mobility comments: up in recliner ?  ? ?Transfers ?Overall transfer level: Needs  assistance ?Equipment used: Ambulation equipment used ?Transfers: Sit to/from Stand ?Sit to Stand: Min assist, +2 physical assistance ?  ?  ?  ?  ?  ?  ?  ?  ?Balance Overall balance assessment: Needs assistance ?Sitting-balance support: Feet supported, No upper extremity supported ?Sitting balance-Leahy Scale: Fair ?  ?  ?Standing balance support: Reliant on assistive device for balance ?Standing balance-Leahy Scale: Poor ?  ?  ?  ?  ?  ?  ?  ?  ?  ?  ?  ?  ?   ? ? ?   ?  ?  ?  ?  ?  ? ?   ?  ?  ?   ?  ?   ?  ? ?Cognition Arousal/Alertness: Awake/alert ?Behavior During Therapy: Anxious ?Overall Cognitive Status: Within Functional Limits for tasks assessed ?  ?  ?  ?  ?  ?  ?  ?  ?  ?  ?  ?  ?  ?  ?  ?  ?  ?  ?  ?   ?   ? ?  ?   ? ? ?  ?    ? ? ?Pertinent Vitals/ Pain       Pain Assessment ?Pain Assessment: No/denies pain ?Pain Intervention(s): Monitored during session ? ?   ?  ?  ?  ?  ?  ?  ?  ?  ?  ?  ?  ?  ?  ?  ?  ?  ?  ?  ? ?  ?    ?  ?  ?  ?   ? ?Frequency ?  Min 2X/week  ? ? ? ? ?  ?Progress Toward Goals ? ?OT Goals(current goals can now be found in the care plan section) ? Progress towards OT goals: Progressing toward goals ? ?Acute Rehab OT Goals ?OT Goal Formulation: With patient ?Time For Goal Achievement: 09/27/21 ?Potential to Achieve Goals: Good  ?Plan Discharge plan remains appropriate   ? ?Co-evaluation ? ? ?   ?  ?  ?  ?  ? ?  ?AM-PAC OT "6 Clicks" Daily Activity     ?Outcome Measure ? ? Help from another person eating meals?: None ?Help from another person taking care of personal grooming?: A Little ?Help from another person toileting, which includes using toliet, bedpan, or urinal?: A Lot ?Help from another person bathing (including washing, rinsing, drying)?: A Lot ?Help from another person to put on and taking off regular upper body clothing?: A Lot ?Help from another person to put on and taking off regular lower body clothing?: A Lot ?6 Click Score: 15 ? ?  ?End of Session Equipment Utilized  During Treatment: Other (comment) ? ?OT Visit Diagnosis: Other abnormalities of gait and mobility (R26.89);Muscle weakness (generalized) (M62.81) ?  ?Activity Tolerance Patient tolerated treatment well ?  ?Patient Left in chair;with call Jokerst/phone within reach;with nursing/sitter in room ?  ?Nurse Communication Mobility status ?  ? ?   ? ?Time: 6468-0321 ?OT Time Calculation (min): 21 min ? ?Charges: OT General Charges ?$OT Visit: 1 Visit ?OT Treatments ?$Therapeutic Activity: 8-22 mins ? ?09/13/2021 ? ?RP, OTR/L ? ?Acute Rehabilitation Services ? ?Office:  [phone] ? ? ?Dorthula Bier D Joshua Soulier ?09/13/2021, 1:58 PM ?

## 2021-09-13 NOTE — Progress Notes (Signed)
HeartMate 3 Rounding Note ? ?Postop day  #10 HeartMate 3 Implantation March 14 ? ?Subjective:   ?Maintaining sinus rhythm now with adequate potassium supplementation. ?Amiodarone transition to oral dosing. ?Started on sildenifil ?Wt down with lasix drip, urine output 4 L daily. ?Had Echo ramp study- speed increased to 5600 rpm  3-22 to optimize pump performance. Patient feels better at higher pump speed ?INR 2.3, Coumadin 2.5 mg ordered ? ?Feeding tube placed today due to inadequate nutritional intake with requirement for ongoing steroids for hypothyroidism, and major wound healing requirement. ? ?LVAD INTERROGATION:  ?HeartMate 3LVAD:  Flow 4.8 liters/min, speed 5600, power 4.8 PI 3.0.  Controller  intact.  ? ?Objective:   ? ?Vital Signs:   ?Temp:  [97.8 ?F (36.6 ?C)-98.4 ?F (36.9 ?C)] 97.9 ?F (36.6 ?C) (03/24 1200) ?Pulse Rate:  [89-90] 90 (03/24 1200) ?Resp:  [18-31] 18 (03/24 1700) ?Arterial Line BP: (68-115)/(55-93) 78/66 (03/24 1700) ?Weight:  [683 kg] 108 kg (03/24 0500) ?Last BM Date : 09/13/21 ?Mean arterial Pressure 65-75 mm Hg ? ?Intake/Output:  ? ?Intake/Output Summary (Last 24 hours) at 09/13/2021 1704 ?Last data filed at 09/13/2021 1700 ?Gross per 24 hour  ?Intake 1612.66 ml  ?Output 5750 ml  ?Net -4137.34 ml  ?  ? ?Physical Exam: ?General:  Well appearing. No resp difficulty, mild tachypnea. Sternal incision dry ?HEENT: normal ?Neck: supple. JVP . 2+ carotids  no bruits. No lymphadenopathy or thryomegaly appreciated. L neck drain for Impella 5.5 out and incision healing ?Cor: Mechanical heart sounds with LVAD hum present. ?Lungs: clear ?Abdomen: soft, nontender, nondistended. No hepatosplenomegaly. No bruits or masses. Good bowel sounds. ?Extremities: no cyanosis, clubbing, rash, edema ?Neuro: alert & orientedx3, cranial nerves grossly intact. moves all 4 extremities w/o difficulty. Affect pleasant ? ?Telemetry: sinus tach ? ?Labs: ?Basic Metabolic Panel: ?Recent Labs  ?Lab 09/07/21 ?0147 09/07/21 ?4196  09/08/21 ?0141 09/08/21 ?0406 09/09/21 ?2229 09/09/21 ?1320 09/10/21 ?7989 09/10/21 ?1200 09/11/21 ?1454 09/12/21 ?0402 09/12/21 ?1210 09/12/21 ?2146 09/13/21 ?0424 09/13/21 ?1436  ?NA 132*   < > 131*   < > 135   < > 135   < > 136 136 136 136 136 137  ?K 3.2*   < > 2.6*   < > 3.6   < > 3.1*   < > 4.6 2.6* 4.5 3.0* 3.2* 3.7  3.8  ?CL 98   < > 95*   < > 99   < > 95*   < > 98 92* 96* 91* 89* 91*  ?CO2 24   < > 24   < > 26   < > 28   < > 28 32 28 32 35* 35*  ?GLUCOSE 86   < > 101*   < > 99   < > 107*   < > 108* 100* 127* 147* 169* 110*  ?BUN 32*   < > 32*   < > 33*   < > 36*   < > 37* 41* 45* 41* 42* 38*  ?CREATININE 0.97   < > 0.84   < > 0.86   < > 0.89   < > 1.02* 1.16* 1.37* 1.39* 1.23* 1.33*  ?CALCIUM 8.8*   < > 8.8*   < > 9.2   < > 9.2   < > 9.3 9.3 9.3 9.4 9.1 9.3  ?MG 2.1   < > 2.1  --  1.7   < > 2.0   < > 2.1 1.8  --  2.1 2.0 2.0  ?PHOS  2.5  --  2.3*  --  2.8  --  3.8  --   --   --   --   --   --  3.5  ? < > = values in this interval not displayed.  ? ? ?Liver Function Tests: ?No results for input(s): AST, ALT, ALKPHOS, BILITOT, PROT, ALBUMIN in the last 168 hours. ? ?No results for input(s): LIPASE, AMYLASE in the last 168 hours. ?No results for input(s): AMMONIA in the last 168 hours. ? ?CBC: ?Recent Labs  ?Lab 09/09/21 ?0253 09/10/21 ?5361 09/10/21 ?4431 09/11/21 ?0407 09/12/21 ?0402 09/13/21 ?0424  ?WBC 30.7*  --  32.4* 26.2* 23.0* 25.8*  ?NEUTROABS 25.0*  --  26.9* 22.8* 20.5* 21.4*  ?HGB 9.6* 10.5* 9.5* 9.7* 9.6* 9.1*  ?HCT 29.9* 31.0* 30.5* 31.1* 30.5* 30.0*  ?MCV 89.0  --  88.9 89.9 90.2 92.3  ?PLT 240  --  343 435* 439* 429*  ? ? ?INR: ?Recent Labs  ?Lab 09/09/21 ?0253 09/10/21 ?5400 09/11/21 ?0407 09/12/21 ?0402 09/13/21 ?0424  ?INR 2.9* 2.2* 1.8* 1.8* 2.3*  ? ? ?Other results: ?EKG:  ? ?Imaging: ?DG Chest Port 1 View ? ?Result Date: 09/13/2021 ?CLINICAL DATA:  Follow-up LVAD. Denies chest pain and shortness of breath. Prior history CHF. EXAM: PORTABLE CHEST 1 VIEW COMPARISON:  09/11/2021 at 5:15 a.m.  FINDINGS: 5:13 a.m., 09/13/2021. Again noted and unchanged are and LVAD obscuring the lateral left base, left chest AID with 2 right ventricular wires, and right PICC terminating in the right atrium. The heart stably enlarged. There are intact median sternotomy sutures. There is a stable appearance perihilar vascular prominence, coarse interstitial changes in the right lower lung field and small right pleural effusion, and perihilar hazy opacities which could be edema and/or pneumonia. There is no new, further or worsening lung opacity. Overall aeration seems unchanged.  Stable mediastinum. IMPRESSION: Stable overall aeration with no interval change in bilateral lung opacities and perihilar vascular prominence. Electronically Signed   By: Telford Nab M.D.   On: 09/13/2021 06:38  ? ?DG Abd Portable 1V ? ?Result Date: 09/13/2021 ?CLINICAL DATA:  Feeding tube placement. EXAM: PORTABLE ABDOMEN - 1 VIEW COMPARISON:  None. FINDINGS: The feeding tube terminates in the region of the gastric antrum. The chest is poorly evaluated. There is incompletely evaluated opacity in the right base. IMPRESSION: 1. The feeding tube is in stable position, terminating in the region the gastric antrum. 2. The chest is poorly evaluated. However, there is incompletely evaluated opacity suspected in the right base. Electronically Signed   By: Dorise Bullion III M.D.   On: 09/13/2021 10:43   ? ? ?Medications:   ? ? ?Scheduled Medications: ? amiodarone  400 mg Oral BID  ? aspirin EC  81 mg Oral Daily  ? bisacodyl  10 mg Oral Daily  ? Chlorhexidine Gluconate Cloth  6 each Topical Daily  ? digoxin  0.0625 mg Oral Daily  ? feeding supplement  237 mL Oral TID BM  ? gabapentin  100 mg Oral TID  ? insulin aspart  0-24 Units Subcutaneous TID WC & HS  ? methimazole  10 mg Oral Daily  ? methimazole  5 mg Oral QPM  ? mirtazapine  7.5 mg Oral QHS  ? pantoprazole  40 mg Oral Daily  ? polyethylene glycol  17 g Oral Daily  ? potassium chloride  40 mEq Oral  Q2H  ? predniSONE  20 mg Oral Q breakfast  ? quiniDINE gluconate  324 mg Oral  BID  ? rosuvastatin  40 mg Oral QHS  ? sildenafil  20 mg Oral TID  ? simethicone  80 mg Oral QID  ? sodium chloride flush  10-40 mL Intracatheter Q12H  ? spironolactone  25 mg Oral Daily  ? Warfarin - Physician Dosing Inpatient   Does not apply q1600  ? ? ?Infusions: ? sodium chloride    ? sodium chloride Stopped (09/09/21 0758)  ? sodium chloride    ? feeding supplement (PIVOT 1.5 CAL) 1,000 mL (09/13/21 1509)  ? furosemide (LASIX) 200 mg in dextrose 5% 100 mL ('2mg'$ /mL) infusion 12 mg/hr (09/13/21 1700)  ? lactated ringers    ? lactated ringers Stopped (09/05/21 1010)  ? ? ?PRN Medications: ?Place/Maintain arterial line **AND** sodium chloride, ALPRAZolam, midazolam, morphine injection, ondansetron (ZOFRAN) IV, oxyCODONE, senna-docusate, sodium chloride flush, sodium chloride flush, sodium chloride flush, traMADol ? ? ?Assessment:  ?History of nonischemic cardiomyopathy with recent readmission for worsening heart failure and cardiogenic shock ? ?Preoperative bridge with Impella 5.5 LVAD to stabilize hemodynamics and improve organ function. ? ?Patient developed significant leukocytosis without fever and with negative cultures prior to implantation after steroid therapy initiated for hyperthyroid condition. ? ?Implantation of HeartMate 3 and removal of Impella 5.5 on September 03, 2021-perioperative coagulopathy and thrombocytopenia ? ? ?Plan/Discussion:   ?Patient progressing slowly - now walking in hall daily ?Pump parameters remain satisfactory with increased  ?speed. ? ?Feeding tube in good position on KUB to initiate tube feeds ? ?Surgical incisions continue to heal.  Leaving chest tube skin sutures in place due to ongoing steroids. ? ? ?I reviewed the LVAD parameters from today, and compared the results to the patient's prior recorded data.  No programming changes were made.  The LVAD is functioning within specified parameters.  The  patient performs LVAD self-test daily.  LVAD interrogation was negative for any significant power changes, alarms or PI events/speed drops.  LVAD equipment check completed and is in good working order.  Ba

## 2021-09-13 NOTE — TOC CM/SW Note (Signed)
HF TOC CM spoke to pt and husband at bedside. States she is does want IP rehab when she is medically stable for dc. Will continue to follow for dc needs. Jonnie Finner RN3 CCM, Heart Failure TOC CM [phone]  ?

## 2021-09-13 NOTE — Progress Notes (Signed)
Brief Nutrition Follow-up: ? ?Cortrak successfully placed today. This RD present during Cortrak placement and pt tolerated extremely well.  ? ?Pt continues with poor appetite and altered tasted. Reports the only things that taste "right" are bananas and chocolate. Pt ate a banana this AM and eating chocolate ice cream after Cortrak placed.  ? ?Interventions: ?1) Tube Feeding via Cortrak: ?Pivot 1.5 at 50 ml/hr ?Begin at 20 ml/hr; titrate by 10 mL q 8 hours until goal rate of 50 ml/hr ?This provide 113 g of protein, 1800 kcals, 912 mL of free water ?Consider transitioning to nocturnal TF in the future ? ?2) Ensure Enlive po TID, each supplement provides 350 kcal and 20 grams of protein. ? ?3) Continue Regular diet; encourage smaller, more frequent meals ? ?Kerman Passey MS, RDN, LDN, CNSC ?Registered Dietitian III ?Clinical Nutrition ?RD Pager and On-Call Pager Number Located in [place]  ? ? ?

## 2021-09-13 NOTE — Progress Notes (Signed)
CSW met at bedside with patient and husband. Patient was sitting up in the chair and in good spirits. Patient shared "We are learning" and seemed very positive. She shared that she feels much better and even walked the hallway today. Patient's husband with a thumbs up and smile was eager to learn more about dressing changes with VAD Coordinator at bedside. CSW provided supportive intervention and will continue to follow throughout implant hospitalization. Raquel Sarna, [place], [place] ? ?

## 2021-09-13 NOTE — Progress Notes (Signed)
? ?  Palliative Medicine Inpatient Follow Up Note ? ? ?Palliative care has been re-consulted due to patient being overwhelmed with LVAD.  ? ?Sallyanne Kuster, Palliative Care Chaplain will meet with family to provide emotional support. ? ?Have requested VAD coordinator also touch base with patient and her spouse. ? ?PMT will supportively continue to follow along. ? ?No Charge ?______________________________________________________________________________________ ?[name] ?[place] Team ?Team Cell Phone: [phone] ?Please utilize secure chat with additional questions, if there is no response within 30 minutes please call the above phone number ? ?Palliative Medicine Team providers are available by phone from 7am to 7pm daily and can be reached through the team cell phone.  ?Should this patient require assistance outside of these hours, please call the patient's attending physician. ? ? ? ? ?

## 2021-09-14 DIAGNOSIS — I480 Paroxysmal atrial fibrillation: Secondary | ICD-10-CM | POA: Diagnosis not present

## 2021-09-14 DIAGNOSIS — R570 Cardiogenic shock: Secondary | ICD-10-CM | POA: Diagnosis not present

## 2021-09-14 DIAGNOSIS — I428 Other cardiomyopathies: Secondary | ICD-10-CM | POA: Diagnosis not present

## 2021-09-14 DIAGNOSIS — Z95811 Presence of heart assist device: Secondary | ICD-10-CM | POA: Diagnosis not present

## 2021-09-14 DIAGNOSIS — I509 Heart failure, unspecified: Secondary | ICD-10-CM | POA: Diagnosis not present

## 2021-09-14 LAB — BASIC METABOLIC PANEL
Anion gap: 10 (ref 5–15)
Anion gap: 8 (ref 5–15)
Anion gap: 10 (ref 5–15)
Anion gap: 10 (ref 5–15)
BUN: 42 mg/dL — ABNORMAL HIGH (ref 6–20)
BUN: 39 mg/dL — ABNORMAL HIGH (ref 6–20)
BUN: 40 mg/dL — ABNORMAL HIGH (ref 6–20)
BUN: 43 mg/dL — ABNORMAL HIGH (ref 6–20)
CO2: 37 mmol/L — ABNORMAL HIGH (ref 22–32)
CO2: 38 mmol/L — ABNORMAL HIGH (ref 22–32)
CO2: 35 mmol/L — ABNORMAL HIGH (ref 22–32)
CO2: 38 mmol/L — ABNORMAL HIGH (ref 22–32)
Calcium: 9.1 mg/dL (ref 8.9–10.3)
Calcium: 9.2 mg/dL (ref 8.9–10.3)
Calcium: 9 mg/dL (ref 8.9–10.3)
Calcium: 9.1 mg/dL (ref 8.9–10.3)
Chloride: 89 mmol/L — ABNORMAL LOW (ref 98–111)
Chloride: 91 mmol/L — ABNORMAL LOW (ref 98–111)
Chloride: 92 mmol/L — ABNORMAL LOW (ref 98–111)
Chloride: 88 mmol/L — ABNORMAL LOW (ref 98–111)
Creatinine, Ser: 1.31 mg/dL — ABNORMAL HIGH (ref 0.44–1.00)
Creatinine, Ser: 1.28 mg/dL — ABNORMAL HIGH (ref 0.44–1.00)
Creatinine, Ser: 1.31 mg/dL — ABNORMAL HIGH (ref 0.44–1.00)
Creatinine, Ser: 1.23 mg/dL — ABNORMAL HIGH (ref 0.44–1.00)
GFR, Estimated: 50 mL/min — ABNORMAL LOW (ref >60)
GFR, Estimated: 51 mL/min — ABNORMAL LOW (ref >60)
GFR, Estimated: 50 mL/min — ABNORMAL LOW (ref >60)
GFR, Estimated: 54 mL/min — ABNORMAL LOW (ref >60)
Glucose, Bld: 119 mg/dL — ABNORMAL HIGH (ref 70–99)
Glucose, Bld: 119 mg/dL — ABNORMAL HIGH (ref 70–99)
Glucose, Bld: 159 mg/dL — ABNORMAL HIGH (ref 70–99)
Glucose, Bld: 141 mg/dL — ABNORMAL HIGH (ref 70–99)
Potassium: 2.8 mmol/L — ABNORMAL LOW (ref 3.5–5.1)
Potassium: 4 mmol/L (ref 3.5–5.1)
Potassium: 3.5 mmol/L (ref 3.5–5.1)
Potassium: 3.4 mmol/L — ABNORMAL LOW (ref 3.5–5.1)
Sodium: 136 mmol/L (ref 135–145)
Sodium: 137 mmol/L (ref 135–145)
Sodium: 137 mmol/L (ref 135–145)
Sodium: 136 mmol/L (ref 135–145)

## 2021-09-14 LAB — CBC WITH DIFFERENTIAL/PLATELET
Abs Immature Granulocytes: 0.82 10*3/uL — ABNORMAL HIGH (ref 0.00–0.07)
Basophils Absolute: 0.1 10*3/uL (ref 0.0–0.1)
Basophils Relative: 0 %
Eosinophils Absolute: 0 10*3/uL (ref 0.0–0.5)
Eosinophils Relative: 0 %
HCT: 29.3 % — ABNORMAL LOW (ref 36.0–46.0)
Hemoglobin: 8.9 g/dL — ABNORMAL LOW (ref 12.0–15.0)
Immature Granulocytes: 3 %
Lymphocytes Relative: 7 %
Lymphs Abs: 1.8 10*3/uL (ref 0.7–4.0)
MCH: 28.3 pg (ref 26.0–34.0)
MCHC: 30.4 g/dL (ref 30.0–36.0)
MCV: 93 fL (ref 80.0–100.0)
Monocytes Absolute: 1.3 10*3/uL — ABNORMAL HIGH (ref 0.1–1.0)
Monocytes Relative: 5 %
Neutro Abs: 23.8 10*3/uL — ABNORMAL HIGH (ref 1.7–7.7)
Neutrophils Relative %: 85 %
Platelets: 451 10*3/uL — ABNORMAL HIGH (ref 150–400)
RBC: 3.15 MIL/uL — ABNORMAL LOW (ref 3.87–5.11)
RDW: 22.5 % — ABNORMAL HIGH (ref 11.5–15.5)
Smear Review: ADEQUATE
WBC: 27.8 10*3/uL — ABNORMAL HIGH (ref 4.0–10.5)
nRBC: 0.5 % — ABNORMAL HIGH (ref 0.0–0.2)

## 2021-09-14 LAB — GLUCOSE, CAPILLARY
Glucose-Capillary: 115 mg/dL — ABNORMAL HIGH (ref 70–99)
Glucose-Capillary: 113 mg/dL — ABNORMAL HIGH (ref 70–99)
Glucose-Capillary: 138 mg/dL — ABNORMAL HIGH (ref 70–99)
Glucose-Capillary: 114 mg/dL — ABNORMAL HIGH (ref 70–99)

## 2021-09-14 LAB — COOXEMETRY PANEL
Carboxyhemoglobin: 3 % — ABNORMAL HIGH (ref 0.5–1.5)
Methemoglobin: 1.2 % (ref 0.0–1.5)
O2 Saturation: 89 %
Total hemoglobin: 8 g/dL — ABNORMAL LOW (ref 12.0–16.0)

## 2021-09-14 LAB — MAGNESIUM
Magnesium: 1.9 mg/dL (ref 1.7–2.4)
Magnesium: 2.6 mg/dL — ABNORMAL HIGH (ref 1.7–2.4)

## 2021-09-14 LAB — PROTIME-INR
INR: 2.8 — ABNORMAL HIGH (ref 0.8–1.2)
Prothrombin Time: 29.4 seconds — ABNORMAL HIGH (ref 11.4–15.2)

## 2021-09-14 LAB — PHOSPHORUS
Phosphorus: 2.9 mg/dL (ref 2.5–4.6)
Phosphorus: 3.1 mg/dL (ref 2.5–4.6)

## 2021-09-14 LAB — LACTATE DEHYDROGENASE: LDH: 539 U/L — ABNORMAL HIGH (ref 98–192)

## 2021-09-14 MED ORDER — POTASSIUM CHLORIDE 10 MEQ/50ML IV SOLN
10.0000 meq | INTRAVENOUS | Status: AC
  Administered 2021-09-14 (×4): 10 meq via INTRAVENOUS
  Filled 2021-09-14 (×4): qty 50

## 2021-09-14 MED ORDER — MAGNESIUM SULFATE 2 GM/50ML IV SOLN
2.0000 g | Freq: Once | INTRAVENOUS | Status: AC
  Administered 2021-09-14: 2 g via INTRAVENOUS
  Filled 2021-09-14: qty 50

## 2021-09-14 MED ORDER — AMIODARONE IV BOLUS ONLY 150 MG/100ML
150.0000 mg | Freq: Once | INTRAVENOUS | Status: AC
  Administered 2021-09-14: 150 mg via INTRAVENOUS
  Filled 2021-09-14: qty 100

## 2021-09-14 MED ORDER — METOLAZONE 2.5 MG PO TABS
2.5000 mg | ORAL_TABLET | Freq: Once | ORAL | Status: AC
  Administered 2021-09-14: 2.5 mg via ORAL
  Filled 2021-09-14: qty 1

## 2021-09-14 MED ORDER — LOPERAMIDE HCL 1 MG/7.5ML PO SUSP
1.0000 mg | Freq: Three times a day (TID) | ORAL | Status: DC | PRN
  Administered 2021-09-14 – 2021-09-17 (×8): 1 mg via ORAL
  Filled 2021-09-14 (×9): qty 7.5

## 2021-09-14 MED ORDER — AMIODARONE HCL IN DEXTROSE 360-4.14 MG/200ML-% IV SOLN: 30.0000 mg/h | INTRAVENOUS | Status: DC

## 2021-09-14 MED ORDER — POTASSIUM CHLORIDE 20 MEQ PO PACK
40.0000 meq | PACK | Freq: Once | ORAL | Status: AC
Start: 2021-09-14 — End: 2021-09-14
  Administered 2021-09-14: 40 meq via ORAL
  Filled 2021-09-14: qty 2

## 2021-09-14 MED ORDER — POTASSIUM CHLORIDE 20 MEQ PO PACK
40.0000 meq | PACK | ORAL | Status: AC
  Administered 2021-09-14 (×2): 40 meq
  Filled 2021-09-14 (×2): qty 2

## 2021-09-14 MED ORDER — AMIODARONE LOAD VIA INFUSION
150.0000 mg | Freq: Once | INTRAVENOUS | Status: AC
  Administered 2021-09-14: 150 mg via INTRAVENOUS
  Filled 2021-09-14: qty 83.34

## 2021-09-14 MED ORDER — AMIODARONE HCL IN DEXTROSE 360-4.14 MG/200ML-% IV SOLN
30.0000 mg/h | INTRAVENOUS | Status: DC
  Administered 2021-09-14 – 2021-09-17 (×12): 60 mg/h via INTRAVENOUS
  Administered 2021-09-17 – 2021-09-19 (×4): 30 mg/h via INTRAVENOUS
  Filled 2021-09-14: qty 400
  Filled 2021-09-14 (×5): qty 200
  Filled 2021-09-14 (×2): qty 400
  Filled 2021-09-14 (×2): qty 200
  Filled 2021-09-14: qty 400
  Filled 2021-09-14 (×3): qty 200

## 2021-09-14 MED ORDER — POTASSIUM CHLORIDE 10 MEQ/50ML IV SOLN
10.0000 meq | INTRAVENOUS | Status: AC
  Administered 2021-09-14 – 2021-09-15 (×4): 10 meq via INTRAVENOUS
  Filled 2021-09-14 (×4): qty 50

## 2021-09-14 MED ORDER — POTASSIUM CHLORIDE 10 MEQ/50ML IV SOLN: 10.0000 meq | INTRAVENOUS | Status: DC

## 2021-09-14 MED ORDER — ACETAZOLAMIDE 250 MG PO TABS
250.0000 mg | ORAL_TABLET | Freq: Every day | ORAL | Status: DC
  Administered 2021-09-14: 250 mg via ORAL
  Filled 2021-09-14: qty 1

## 2021-09-14 MED ORDER — AMIODARONE HCL IN DEXTROSE 360-4.14 MG/200ML-% IV SOLN: 60.0000 mg/h | INTRAVENOUS | Status: DC

## 2021-09-14 MED ORDER — WARFARIN SODIUM 2 MG PO TABS
2.0000 mg | ORAL_TABLET | Freq: Once | ORAL | Status: AC
  Administered 2021-09-14: 2 mg via ORAL
  Filled 2021-09-14: qty 1

## 2021-09-14 NOTE — Progress Notes (Signed)
CT surgery PM rounds ? ?Patient feels better back in sinus rhythm sitting in chair ?Complains of several loose stools today which has drained her energy ?P.m. potassium 3.4, will order IV rounds 10 mill equivalents x4 ? ?Blood pressure (!) 61/41, pulse (!) 103, temperature 98.3 ?F (36.8 ?C), temperature source Oral, resp. rate (!) 24, height '5\' 3"'$  (1.6 m), weight 108 kg, SpO2 98 %.  ?

## 2021-09-14 NOTE — Progress Notes (Signed)
?  09/14/21 1600  ?Clinical Encounter Type  ?Visited With Health care provider;Patient;Other (Comment)  ?Visit Type Critical Care;Post-op;Spiritual support;Initial  ?Referral From Other (Comment) ?(LVAD Team)  ?Consult/Referral To Nurse ?Albertina Parr Evans)  ?Spiritual Encounters  ?Spiritual Needs Emotional;Prayer  ? ?Page from LVAD Team requesting Spiritual Care visit and Prayer for Ms. [name]. Met Ms. Kapfer at bedside. Ms. Pina stated that she is having a rough day. Karmah shared with me her [place] upbringing and strong faith in the [place]. Reona stated that she had fallen away from the church recently, but that she wants to return when she is well enough. She also shared that though she and her husband had fallen away from attendance they are in the Word and reading Scripture together daily. Yesenia stated that she has strong faith and hope. Chaplain encouraged her to become a witness to others. At Ms. Clarida's request we shared in Prayer for comfort, healing, strength, and courage for her, and for her family. [address] [place], [place] [phone]. ?

## 2021-09-14 NOTE — Progress Notes (Signed)
Pharmacy Electrolyte Replacement ? ?K+ 2.8 ? ?Recent Labs: ? ?Recent Labs  ?  09/13/21 ?1436 09/14/21 ?0204  ?K 3.7  3.8 2.8*  ?MG 2.0  --   ?PHOS 3.5  --   ?CREATININE 1.33* 1.31*  ? ? ? ?Plan ?KCl 40 meq packet per tube q2h x 2 doses ?KCl 10 meq IV q1h x 4 doses ?1000 BMET ? ?Narda Bonds, PharmD, BCPS ?Clinical Pharmacist ?Phone: [phone] ? ?

## 2021-09-14 NOTE — Progress Notes (Signed)
Went to check on patient and noticed the patient's foley bag was leaking. Foley exchange performed by this RN and Caron Presume RN.  ?

## 2021-09-14 NOTE — Progress Notes (Signed)
HeartMate 3 Rounding Note ? ?Postop day  #11 HeartMate 3 Implantation March 14 ? ?Subjective:   ?Patient symptomatic with recurrent atrial fibrillation overnight after amiodarone was transitioned from IV to oral dosing.  Patient now back on IV amiodarone.  Pump performance remains good with flow 5.1 L/min. ?Continues to diurese well, lost another 2 pounds yesterday.  Having some loose stools associated with tube feeds. ? ?Feeding tube placed today due to inadequate nutritional intake with requirement for ongoing steroids for hypothyroidism, and major wound healing requirement. ? ?LVAD INTERROGATION:  ?HeartMate 3LVAD:  Flow 5.1liters/min, speed 5600, power 4.8 PI 3.0.  Controller  intact.  ? ?Objective:   ? ?Vital Signs:   ?Temp:  [97.6 ?F (36.4 ?C)-98.3 ?F (36.8 ?C)] 98.3 ?F (36.8 ?C) (03/25 1143) ?Pulse Rate:  [90-103] 103 (03/25 0935) ?Resp:  [18-29] 24 (03/24 2100) ?Arterial Line BP: (60-91)/(55-76) 63/58 (03/25 0700) ?Last BM Date : 09/14/21 ?Mean arterial Pressure 65-75 mm Hg ? ?Intake/Output:  ? ?Intake/Output Summary (Last 24 hours) at 09/14/2021 1153 ?Last data filed at 09/14/2021 4332 ?Gross per 24 hour  ?Intake 839.27 ml  ?Output 1825 ml  ?Net -985.73 ml  ?  ? ?Physical Exam: ?General:  Well appearing. No resp difficulty, mild tachypnea. Sternal incision dry ?HEENT: normal ?Neck: supple. JVP . 2+ carotids  no bruits. No lymphadenopathy or thryomegaly appreciated. L neck drain for Impella 5.5 out and incision healing ?Cor: Mechanical heart sounds with LVAD hum present. ?Lungs: clear ?Abdomen: soft, nontender, nondistended. No hepatosplenomegaly. No bruits or masses. Good bowel sounds. ?Extremities: no cyanosis, clubbing, rash, edema ?Neuro: alert & orientedx3, cranial nerves grossly intact. moves all 4 extremities w/o difficulty. Affect pleasant ? ?Telemetry: sinus tach ? ?Labs: ?Basic Metabolic Panel: ?Recent Labs  ?Lab 09/08/21 ?0141 09/08/21 ?0406 09/09/21 ?0253 09/09/21 ?1320 09/10/21 ?9518  09/10/21 ?1200 09/12/21 ?0402 09/12/21 ?1210 09/12/21 ?2146 09/13/21 ?0424 09/13/21 ?1436 09/14/21 ?8416 09/14/21 ?6063 09/14/21 ?0160  ?NA 131*   < > 135   < > 135   < > 136   < > 136 136 137 136 137 137  ?K 2.6*   < > 3.6   < > 3.1*   < > 2.6*   < > 3.0* 3.2* 3.7  3.8 2.8* 4.0 3.5  ?CL 95*   < > 99   < > 95*   < > 92*   < > 91* 89* 91* 89* 91* 92*  ?CO2 24   < > 26   < > 28   < > 32   < > 32 35* 35* 37* 38* 35*  ?GLUCOSE 101*   < > 99   < > 107*   < > 100*   < > 147* 169* 110* 119* 119* 159*  ?BUN 32*   < > 33*   < > 36*   < > 41*   < > 41* 42* 38* 42* 39* 40*  ?CREATININE 0.84   < > 0.86   < > 0.89   < > 1.16*   < > 1.39* 1.23* 1.33* 1.31* 1.28* 1.31*  ?CALCIUM 8.8*   < > 9.2   < > 9.2   < > 9.3   < > 9.4 9.1 9.3 9.1 9.2 9.0  ?MG 2.1  --  1.7   < > 2.0   < > 1.8  --  2.1 2.0 2.0  --  1.9  --   ?PHOS 2.3*  --  2.8  --  3.8  --   --   --   --   --  3.5  --  2.9  --   ? < > = values in this interval not displayed.  ? ? ?Liver Function Tests: ?No results for input(s): AST, ALT, ALKPHOS, BILITOT, PROT, ALBUMIN in the last 168 hours. ? ?No results for input(s): LIPASE, AMYLASE in the last 168 hours. ?No results for input(s): AMMONIA in the last 168 hours. ? ?CBC: ?Recent Labs  ?Lab 09/10/21 ?9622 09/11/21 ?0407 09/12/21 ?0402 09/13/21 ?0424 09/14/21 ?0431  ?WBC 32.4* 26.2* 23.0* 25.8* 27.8*  ?NEUTROABS 26.9* 22.8* 20.5* 21.4* 23.8*  ?HGB 9.5* 9.7* 9.6* 9.1* 8.9*  ?HCT 30.5* 31.1* 30.5* 30.0* 29.3*  ?MCV 88.9 89.9 90.2 92.3 93.0  ?PLT 343 435* 439* 429* 451*  ? ? ?INR: ?Recent Labs  ?Lab 09/10/21 ?2979 09/11/21 ?0407 09/12/21 ?0402 09/13/21 ?0424 09/14/21 ?0431  ?INR 2.2* 1.8* 1.8* 2.3* 2.8*  ? ? ?Other results: ?EKG:  ? ?Imaging: ?DG Chest Port 1 View ? ?Result Date: 09/13/2021 ?CLINICAL DATA:  Follow-up LVAD. Denies chest pain and shortness of breath. Prior history CHF. EXAM: PORTABLE CHEST 1 VIEW COMPARISON:  09/11/2021 at 5:15 a.m. FINDINGS: 5:13 a.m., 09/13/2021. Again noted and unchanged are and LVAD obscuring the  lateral left base, left chest AID with 2 right ventricular wires, and right PICC terminating in the right atrium. The heart stably enlarged. There are intact median sternotomy sutures. There is a stable appearance perihilar vascular prominence, coarse interstitial changes in the right lower lung field and small right pleural effusion, and perihilar hazy opacities which could be edema and/or pneumonia. There is no new, further or worsening lung opacity. Overall aeration seems unchanged.  Stable mediastinum. IMPRESSION: Stable overall aeration with no interval change in bilateral lung opacities and perihilar vascular prominence. Electronically Signed   By: Telford Nab M.D.   On: 09/13/2021 06:38  ? ?DG Abd Portable 1V ? ?Result Date: 09/13/2021 ?CLINICAL DATA:  Feeding tube placement. EXAM: PORTABLE ABDOMEN - 1 VIEW COMPARISON:  None. FINDINGS: The feeding tube terminates in the region of the gastric antrum. The chest is poorly evaluated. There is incompletely evaluated opacity in the right base. IMPRESSION: 1. The feeding tube is in stable position, terminating in the region the gastric antrum. 2. The chest is poorly evaluated. However, there is incompletely evaluated opacity suspected in the right base. Electronically Signed   By: Dorise Bullion III M.D.   On: 09/13/2021 10:43   ? ? ?Medications:   ? ? ?Scheduled Medications: ? acetaZOLAMIDE  250 mg Oral Daily  ? aspirin EC  81 mg Oral Daily  ? Chlorhexidine Gluconate Cloth  6 each Topical Daily  ? digoxin  0.0625 mg Oral Daily  ? feeding supplement  237 mL Oral TID BM  ? gabapentin  100 mg Oral TID  ? insulin aspart  0-24 Units Subcutaneous TID WC & HS  ? methimazole  10 mg Oral Daily  ? methimazole  5 mg Oral QPM  ? metolazone  2.5 mg Oral Once  ? mirtazapine  7.5 mg Oral QHS  ? pantoprazole  40 mg Oral Daily  ? potassium chloride  40 mEq Oral Once  ? predniSONE  20 mg Oral Q breakfast  ? quiniDINE gluconate  324 mg Oral BID  ? rosuvastatin  40 mg Oral QHS  ?  sildenafil  20 mg Oral TID  ? sodium chloride flush  10-40 mL Intracatheter Q12H  ? spironolactone  25 mg Oral Daily  ? warfarin  2 mg Oral ONCE-1600  ? Warfarin - Physician Dosing  Inpatient   Does not apply q1600  ? ? ?Infusions: ? sodium chloride    ? sodium chloride Stopped (09/09/21 0758)  ? sodium chloride    ? amiodarone 60 mg/hr (09/14/21 0700)  ? feeding supplement (PIVOT 1.5 CAL) 1,000 mL (09/14/21 0800)  ? furosemide (LASIX) 200 mg in dextrose 5% 100 mL ('2mg'$ /mL) infusion 12 mg/hr (09/14/21 0700)  ? lactated ringers    ? lactated ringers Stopped (09/05/21 1010)  ? ? ?PRN Medications: ?Place/Maintain arterial line **AND** sodium chloride, ALPRAZolam, ondansetron (ZOFRAN) IV, oxyCODONE, senna-docusate, sodium chloride, sodium chloride flush, sodium chloride flush, sodium chloride flush, traMADol ? ? ?Assessment:  ?History of nonischemic cardiomyopathy with recent readmission for worsening heart failure and cardiogenic shock ? ?Preoperative bridge with Impella 5.5 LVAD to stabilize hemodynamics and improve organ function. ? ?Patient developed significant leukocytosis without fever and with negative cultures prior to implantation after steroid therapy initiated for hyperthyroid condition. ? ?Implantation of HeartMate 3 and removal of Impella 5.5 on September 03, 2021-perioperative coagulopathy and thrombocytopenia ? ? ?Plan/Discussion:   ?Patient receiving IV amiodarone and IV potassium chloride to help conversion to sinus rhythm. ?Tube feeds started for poor postoperative nutritional intake ?Surgical incisions remain clean and dry. ?Weight approaching her dry weight with IV Lasix drip ?Coumadin dosed at 2 mg for INR 2.8 ? ? ?I reviewed the LVAD parameters from today, and compared the results to the patient's prior recorded data.  No programming changes were made.  The LVAD is functioning within specified parameters.  The patient performs LVAD self-test daily.  LVAD interrogation was negative for any significant  power changes, alarms or PI events/speed drops.  LVAD equipment check completed and is in good working order.  Back-up equipment present.   LVAD education done on emergency procedures and precautions and reviewed exi

## 2021-09-14 NOTE — Progress Notes (Signed)
Patient ID: [name], female   DOB: 1970/06/30, 51 y.o.   MRN: [id number] ?HeartMate 2 Rounding Note ? ?Subjective:   ? ?- 3/14: HM3 LVAD placement with removal of Impella 5.5  ?- 3/15: Speed increased to 5300 rpm. Given 1UPRBC. Febrile. Cultures obtained. Extubated.  ?- 3/16 VT paced out. On amio drip. Given 1UPRBCs . Given couple doses of IV lasix.  ?- 3/18 Rapid ALF. Bolused multiple times with amio and drip turned up to 60. Now back in NSR ?- 3/19 VAD Speed increased to 5400 (high PI)  ?- 3/20 Recurrent ALF. Bolused w/ amio. Milrinone discontinued ?- 3/22 Ramp echo, Fixed Speed 5600  ? ?Co-ox 89% off milrinone. ? ?Patient went back into atrial fibrillation with RVR this morning about 2 hrs ago.  HR up to 140s initially, now 110s-120s with bolus of amiodarone.  Feels worse in AF.  CVP up to 15.  Over the last day, however, I/Os negative and weight down 3 lbs. Prior to AF, she was feeling well and had been walking.  MAP around 65 in AF.  ? ?LDH 498>>519>>534>>548>>533>>539 ?INR 2.8 ? ?Now off abx. WBC 24K>25>30>32>26>23>26>27.8K. No fevers. On po prednisone.  ? ?Getting TFs via [employer] ? ? ?LVAD INTERROGATION:  ?HeartMate II LVAD:  Flow 5.3  liters/min, speed 5600, power 4.3, PI 2.  She has had multiple PI events since she went back into AF. No low flow alarms. VAD interrogated personally. Parameters stable. ? ? ?Objective:   ? ?Vital Signs:   ?Temp:  [97.8 ?F (36.6 ?C)-98 ?F (36.7 ?C)] 97.8 ?F (36.6 ?C) (03/24 1900) ?Pulse Rate:  [89-95] 95 (03/24 1600) ?Resp:  [18-31] 24 (03/24 2100) ?Arterial Line BP: (68-91)/(55-80) 69/63 (03/25 0500) ?Last BM Date : 09/13/21 ?Mean arterial Pressure  65 ? ?Intake/Output:  ? ?Intake/Output Summary (Last 24 hours) at 09/14/2021 0641 ?Last data filed at 09/14/2021 0500 ?Gross per 24 hour  ?Intake 1250.92 ml  ?Output 3700 ml  ?Net -2449.08 ml  ?  ?Physical exam ? ?General: Well appearing this am. NAD.  ?HEENT: Normal. ?Neck: Supple, JVP 12 cm. Carotids OK.  ?Cardiac:  Mechanical  heart sounds with LVAD hum present.  ?Lungs:  CTAB, normal effort.  ?Abdomen:  NT, ND, no HSM. No bruits or masses. +BS  ?LVAD exit site: Well-healed and incorporated. Dressing dry and intact. No erythema or drainage. Stabilization device present and accurately applied. Driveline dressing changed daily per sterile technique. ?Extremities:  Warm and dry. No cyanosis, clubbing, rash, or edema.  ?Neuro:  Alert & oriented x 3. Cranial nerves grossly intact. Moves all 4 extremities w/o difficulty. Affect pleasant   ? ?Telemetry: AF 110s-120s (personally reviewed) ? ? ? ?Labs: ?Basic Metabolic Panel: ?Recent Labs  ?Lab 09/08/21 ?0141 09/08/21 ?0406 09/09/21 ?0253 09/09/21 ?1320 09/10/21 ?4098 09/10/21 ?1200 09/12/21 ?0402 09/12/21 ?1210 09/12/21 ?2146 09/13/21 ?0424 09/13/21 ?1436 09/14/21 ?1191 09/14/21 ?0431  ?NA 131*   < > 135   < > 135   < > 136   < > 136 136 137 136 137  ?K 2.6*   < > 3.6   < > 3.1*   < > 2.6*   < > 3.0* 3.2* 3.7  3.8 2.8* 4.0  ?CL 95*   < > 99   < > 95*   < > 92*   < > 91* 89* 91* 89* 91*  ?CO2 24   < > 26   < > 28   < > 32   < > 32  35* 35* 37* 38*  ?GLUCOSE 101*   < > 99   < > 107*   < > 100*   < > 147* 169* 110* 119* 119*  ?BUN 32*   < > 33*   < > 36*   < > 41*   < > 41* 42* 38* 42* 39*  ?CREATININE 0.84   < > 0.86   < > 0.89   < > 1.16*   < > 1.39* 1.23* 1.33* 1.31* 1.28*  ?CALCIUM 8.8*   < > 9.2   < > 9.2   < > 9.3   < > 9.4 9.1 9.3 9.1 9.2  ?MG 2.1  --  1.7   < > 2.0   < > 1.8  --  2.1 2.0 2.0  --  1.9  ?PHOS 2.3*  --  2.8  --  3.8  --   --   --   --   --  3.5  --  2.9  ? < > = values in this interval not displayed.  ? ? ?Liver Function Tests: ?No results for input(s): AST, ALT, ALKPHOS, BILITOT, PROT, ALBUMIN in the last 168 hours. ? ?No results for input(s): LIPASE, AMYLASE in the last 168 hours. ?No results for input(s): AMMONIA in the last 168 hours. ? ?CBC: ?Recent Labs  ?Lab 09/10/21 ?5284 09/11/21 ?0407 09/12/21 ?0402 09/13/21 ?0424 09/14/21 ?0431  ?WBC 32.4* 26.2* 23.0* 25.8* 27.8*   ?NEUTROABS 26.9* 22.8* 20.5* 21.4* 23.8*  ?HGB 9.5* 9.7* 9.6* 9.1* 8.9*  ?HCT 30.5* 31.1* 30.5* 30.0* 29.3*  ?MCV 88.9 89.9 90.2 92.3 93.0  ?PLT 343 435* 439* 429* 451*  ? ? ?INR: ?Recent Labs  ?Lab 09/10/21 ?1324 09/11/21 ?0407 09/12/21 ?0402 09/13/21 ?0424 09/14/21 ?0431  ?INR 2.2* 1.8* 1.8* 2.3* 2.8*  ? ? ?Other results: ? ? ?Imaging: ?DG Chest Port 1 View ? ?Result Date: 09/13/2021 ?CLINICAL DATA:  Follow-up LVAD. Denies chest pain and shortness of breath. Prior history CHF. EXAM: PORTABLE CHEST 1 VIEW COMPARISON:  09/11/2021 at 5:15 a.m. FINDINGS: 5:13 a.m., 09/13/2021. Again noted and unchanged are and LVAD obscuring the lateral left base, left chest AID with 2 right ventricular wires, and right PICC terminating in the right atrium. The heart stably enlarged. There are intact median sternotomy sutures. There is a stable appearance perihilar vascular prominence, coarse interstitial changes in the right lower lung field and small right pleural effusion, and perihilar hazy opacities which could be edema and/or pneumonia. There is no new, further or worsening lung opacity. Overall aeration seems unchanged.  Stable mediastinum. IMPRESSION: Stable overall aeration with no interval change in bilateral lung opacities and perihilar vascular prominence. Electronically Signed   By: Telford Nab M.D.   On: 09/13/2021 06:38  ? ?DG Abd Portable 1V ? ?Result Date: 09/13/2021 ?CLINICAL DATA:  Feeding tube placement. EXAM: PORTABLE ABDOMEN - 1 VIEW COMPARISON:  None. FINDINGS: The feeding tube terminates in the region of the gastric antrum. The chest is poorly evaluated. There is incompletely evaluated opacity in the right base. IMPRESSION: 1. The feeding tube is in stable position, terminating in the region the gastric antrum. 2. The chest is poorly evaluated. However, there is incompletely evaluated opacity suspected in the right base. Electronically Signed   By: Dorise Bullion III M.D.   On: 09/13/2021 10:43    ? ? ?Medications:   ? ? ?Scheduled Medications: ? acetaZOLAMIDE  250 mg Oral Daily  ? aspirin EC  81 mg Oral Daily  ?  bisacodyl  10 mg Oral Daily  ? Chlorhexidine Gluconate Cloth  6 each Topical Daily  ? digoxin  0.0625 mg Oral Daily  ? feeding supplement  237 mL Oral TID BM  ? gabapentin  100 mg Oral TID  ? insulin aspart  0-24 Units Subcutaneous TID WC & HS  ? methimazole  10 mg Oral Daily  ? methimazole  5 mg Oral QPM  ? metolazone  2.5 mg Oral Once  ? mirtazapine  7.5 mg Oral QHS  ? pantoprazole  40 mg Oral Daily  ? polyethylene glycol  17 g Oral Daily  ? predniSONE  20 mg Oral Q breakfast  ? quiniDINE gluconate  324 mg Oral BID  ? rosuvastatin  40 mg Oral QHS  ? sildenafil  20 mg Oral TID  ? sodium chloride flush  10-40 mL Intracatheter Q12H  ? spironolactone  25 mg Oral Daily  ? Warfarin - Physician Dosing Inpatient   Does not apply q1600  ? ? ?Infusions: ? sodium chloride    ? sodium chloride Stopped (09/09/21 0758)  ? sodium chloride    ? amiodarone    ? feeding supplement (PIVOT 1.5 CAL) 30 mL/hr at 09/13/21 2300  ? furosemide (LASIX) 200 mg in dextrose 5% 100 mL ('2mg'$ /mL) infusion 12 mg/hr (09/14/21 0500)  ? lactated ringers    ? lactated ringers Stopped (09/05/21 1010)  ? magnesium sulfate bolus IVPB    ? potassium chloride 10 mEq (09/14/21 0538)  ? ? ?PRN Medications: ?Place/Maintain arterial line **AND** sodium chloride, ALPRAZolam, ondansetron (ZOFRAN) IV, oxyCODONE, senna-docusate, sodium chloride, sodium chloride flush, sodium chloride flush, sodium chloride flush, traMADol ? ? ? ?Plan/Discussion:   ? ? 1. Acute on chronic systolic HF: Nonischemic cardiomyopathy.  She has a [employer] ICD. Cause for cardiomyopathy is uncertain, initially identified peri-partum (after son's birth), so cannot rule out peri-partum CMP.  On and off, she has had frequent PVCs.   RHC in 8/15 showed normal filling pressures and preserved cardiac output. Echo in 4/22 with EF 25%, severe LV dilation.  CPX (8/22) with  mild-moderate HF limitation.  Echo this admission with EF < 20% with severe dilation, normal RV size with mildly decreased systolic function, moderate-severe functional MR with dilated IVC. She was admitted with volume overloa

## 2021-09-14 NOTE — Progress Notes (Signed)
Drive line dressing change:  ? LVAD driveline dressing was changed using proper sterile technique. Old dressing removed. Driveline site cleaned using chloroprep x2. Silver strip applied. New gauze dressing applied using sterile technique. Driveline secured at driveline site with 1 suture. Driveline site clean, minimal sanguinous drainage at site, no redness, no swelling, no tenderness, no odor from drainage. Driveline anchor intact and applied correctly.  ? ?NEXT DRESSING CHANGE DUE 09/15/2021.  ? ?Alma Friendly RN   ?

## 2021-09-15 ENCOUNTER — Inpatient Hospital Stay (HOSPITAL_COMMUNITY): Payer: BC Managed Care – PPO

## 2021-09-15 DIAGNOSIS — I428 Other cardiomyopathies: Secondary | ICD-10-CM | POA: Diagnosis not present

## 2021-09-15 DIAGNOSIS — R570 Cardiogenic shock: Secondary | ICD-10-CM | POA: Diagnosis not present

## 2021-09-15 DIAGNOSIS — I509 Heart failure, unspecified: Secondary | ICD-10-CM | POA: Diagnosis not present

## 2021-09-15 DIAGNOSIS — I5043 Acute on chronic combined systolic (congestive) and diastolic (congestive) heart failure: Secondary | ICD-10-CM | POA: Diagnosis not present

## 2021-09-15 LAB — CBC WITH DIFFERENTIAL/PLATELET
Abs Immature Granulocytes: 0.99 10*3/uL — ABNORMAL HIGH (ref 0.00–0.07)
Basophils Absolute: 0.1 10*3/uL (ref 0.0–0.1)
Basophils Relative: 0 %
Eosinophils Absolute: 0 10*3/uL (ref 0.0–0.5)
Eosinophils Relative: 0 %
HCT: 29.5 % — ABNORMAL LOW (ref 36.0–46.0)
Hemoglobin: 9 g/dL — ABNORMAL LOW (ref 12.0–15.0)
Immature Granulocytes: 3 %
Lymphocytes Relative: 6 %
Lymphs Abs: 1.8 10*3/uL (ref 0.7–4.0)
MCH: 28.4 pg (ref 26.0–34.0)
MCHC: 30.5 g/dL (ref 30.0–36.0)
MCV: 93.1 fL (ref 80.0–100.0)
Monocytes Absolute: 1.1 10*3/uL — ABNORMAL HIGH (ref 0.1–1.0)
Monocytes Relative: 4 %
Neutro Abs: 26 10*3/uL — ABNORMAL HIGH (ref 1.7–7.7)
Neutrophils Relative %: 87 %
Platelets: 413 10*3/uL — ABNORMAL HIGH (ref 150–400)
RBC: 3.17 MIL/uL — ABNORMAL LOW (ref 3.87–5.11)
RDW: 22.1 % — ABNORMAL HIGH (ref 11.5–15.5)
Smear Review: ADEQUATE
WBC: 30.1 10*3/uL — ABNORMAL HIGH (ref 4.0–10.5)
nRBC: 0.9 % — ABNORMAL HIGH (ref 0.0–0.2)

## 2021-09-15 LAB — BASIC METABOLIC PANEL
Anion gap: 11 (ref 5–15)
Anion gap: 10 (ref 5–15)
BUN: 44 mg/dL — ABNORMAL HIGH (ref 6–20)
BUN: 51 mg/dL — ABNORMAL HIGH (ref 6–20)
CO2: 41 mmol/L — ABNORMAL HIGH (ref 22–32)
CO2: 39 mmol/L — ABNORMAL HIGH (ref 22–32)
Calcium: 9.1 mg/dL (ref 8.9–10.3)
Calcium: 9.1 mg/dL (ref 8.9–10.3)
Chloride: 85 mmol/L — ABNORMAL LOW (ref 98–111)
Chloride: 86 mmol/L — ABNORMAL LOW (ref 98–111)
Creatinine, Ser: 1.22 mg/dL — ABNORMAL HIGH (ref 0.44–1.00)
Creatinine, Ser: 1.18 mg/dL — ABNORMAL HIGH (ref 0.44–1.00)
GFR, Estimated: 54 mL/min — ABNORMAL LOW (ref >60)
GFR, Estimated: 56 mL/min — ABNORMAL LOW (ref >60)
Glucose, Bld: 122 mg/dL — ABNORMAL HIGH (ref 70–99)
Glucose, Bld: 129 mg/dL — ABNORMAL HIGH (ref 70–99)
Potassium: 2.6 mmol/L — CL (ref 3.5–5.1)
Potassium: 3.2 mmol/L — ABNORMAL LOW (ref 3.5–5.1)
Sodium: 137 mmol/L (ref 135–145)
Sodium: 135 mmol/L (ref 135–145)

## 2021-09-15 LAB — COOXEMETRY PANEL
Carboxyhemoglobin: 2.4 % — ABNORMAL HIGH (ref 0.5–1.5)
Methemoglobin: <0.7 % (ref 0.0–1.5)
O2 Saturation: 68.1 %
Total hemoglobin: 7.1 g/dL — ABNORMAL LOW (ref 12.0–16.0)

## 2021-09-15 LAB — LACTATE DEHYDROGENASE: LDH: 534 U/L — ABNORMAL HIGH (ref 98–192)

## 2021-09-15 LAB — MAGNESIUM: Magnesium: 2.2 mg/dL (ref 1.7–2.4)

## 2021-09-15 LAB — PROTIME-INR
INR: 2.3 — ABNORMAL HIGH (ref 0.8–1.2)
Prothrombin Time: 25.7 seconds — ABNORMAL HIGH (ref 11.4–15.2)

## 2021-09-15 LAB — GLUCOSE, CAPILLARY
Glucose-Capillary: 99 mg/dL (ref 70–99)
Glucose-Capillary: 123 mg/dL — ABNORMAL HIGH (ref 70–99)
Glucose-Capillary: 147 mg/dL — ABNORMAL HIGH (ref 70–99)
Glucose-Capillary: 131 mg/dL — ABNORMAL HIGH (ref 70–99)

## 2021-09-15 MED ORDER — POTASSIUM CHLORIDE 10 MEQ/50ML IV SOLN
10.0000 meq | INTRAVENOUS | Status: AC
  Administered 2021-09-15 (×2): 10 meq via INTRAVENOUS
  Filled 2021-09-15 (×2): qty 50

## 2021-09-15 MED ORDER — WARFARIN SODIUM 2.5 MG PO TABS
2.5000 mg | ORAL_TABLET | Freq: Every day | ORAL | Status: AC
  Administered 2021-09-15: 2.5 mg via ORAL
  Filled 2021-09-15: qty 1

## 2021-09-15 MED ORDER — POTASSIUM CHLORIDE 10 MEQ/100ML IV SOLN
10.0000 meq | Freq: Once | INTRAVENOUS | Status: AC
  Administered 2021-09-15: 10 meq via INTRAVENOUS
  Filled 2021-09-15: qty 100

## 2021-09-15 MED ORDER — ACETAZOLAMIDE 250 MG PO TABS: 250.0000 mg | ORAL_TABLET | Freq: Every day | ORAL | Status: DC

## 2021-09-15 MED ORDER — ACETAZOLAMIDE 250 MG PO TABS
250.0000 mg | ORAL_TABLET | Freq: Two times a day (BID) | ORAL | Status: AC
Start: 2021-09-15 — End: 2021-09-15
  Administered 2021-09-15 (×2): 250 mg via ORAL
  Filled 2021-09-15 (×2): qty 1

## 2021-09-15 MED ORDER — POTASSIUM CHLORIDE 20 MEQ PO PACK
40.0000 meq | PACK | Freq: Three times a day (TID) | ORAL | Status: AC
Start: 2021-09-15 — End: 2021-09-16
  Administered 2021-09-15 – 2021-09-16 (×6): 40 meq via ORAL
  Filled 2021-09-15 (×6): qty 2

## 2021-09-15 MED ORDER — POTASSIUM CHLORIDE 10 MEQ/100ML IV SOLN
10.0000 meq | INTRAVENOUS | Status: AC
  Administered 2021-09-15 (×3): 10 meq via INTRAVENOUS
  Filled 2021-09-15 (×3): qty 100

## 2021-09-15 MED ORDER — MIDODRINE HCL 5 MG PO TABS
5.0000 mg | ORAL_TABLET | Freq: Three times a day (TID) | ORAL | Status: DC
  Administered 2021-09-15 (×3): 5 mg via ORAL
  Filled 2021-09-15 (×3): qty 1

## 2021-09-15 MED ORDER — POTASSIUM CHLORIDE 20 MEQ PO PACK
40.0000 meq | PACK | Freq: Once | ORAL | Status: AC
  Administered 2021-09-15: 40 meq
  Filled 2021-09-15: qty 2

## 2021-09-15 NOTE — Progress Notes (Signed)
HeartMate 3 Rounding Note ? ?Postop day  #12 HeartMate 3 Implantation March 14 ? ?Subjective:   ?Patient maintaining sinus rhythm overnight now back on IV amiodarone ?Excellent diuresis continues weight now only 4 kg above dry weight ?Hypokalemia treated with IV potassium supplementation due to diarrhea and poor absorption of oral medication ?Chest x-ray remains clear.  White count remains elevated probably secondary to steroid therapy for hyperthyroidism. ?LVAD parameters and flow remained stable ?Core track feedings at goal due to poor oral intake ?INR remains therapeutic at 2.3 ? ? ?LVAD INTERROGATION:  ?HeartMate 3LVAD:  Flow 5.1liters/min, speed 5600, power 4.8 PI 3.0.  Controller  intact.  ? ?Objective:   ? ?Vital Signs:   ?Temp:  [97.8 ?F (36.6 ?C)-98.4 ?F (36.9 ?C)] 97.8 ?F (36.6 ?C) (03/26 1131) ?Pulse Rate:  [86] 86 (03/26 0944) ?Resp:  [18-28] 24 (03/26 1000) ?Arterial Line BP: (64-86)/(52-76) 71/61 (03/26 1000) ?Last BM Date : 09/15/21 ?Mean arterial Pressure 65-29m Hg ? ?Intake/Output:  ? ?Intake/Output Summary (Last 24 hours) at 09/15/2021 1214 ?Last data filed at 09/15/2021 1100 ?Gross per 24 hour  ?Intake 1717.6 ml  ?Output 2875 ml  ?Net -1157.4 ml  ?  ? ?Physical Exam: ?General:  Well appearing. No resp difficulty, mild tachypnea. Sternal incision dry ?HEENT: normal ?Neck: supple. JVP . 2+ carotids  no bruits. No lymphadenopathy or thryomegaly appreciated. L neck drain for Impella 5.5 out and incision healing ?Cor: Mechanical heart sounds with LVAD hum present. ?Lungs: clear ?Abdomen: soft, nontender, nondistended. No hepatosplenomegaly. No bruits or masses. Good bowel sounds. ?Extremities: no cyanosis, clubbing, rash, edema ?Neuro: alert & orientedx3, cranial nerves grossly intact. moves all 4 extremities w/o difficulty. Affect pleasant ? ?Telemetry: sinus tach ? ?Labs: ?Basic Metabolic Panel: ?Recent Labs  ?Lab 09/09/21 ?0253 09/09/21 ?1320 09/10/21 ?0166003/21/23 ?1200 09/13/21 ?0424  09/13/21 ?1436 09/14/21 ?0630103/25/23 ?0601003/25/23 ?0932303/25/23 ?1629 09/15/21 ?0320  ?NA 135   < > 135   < > 136 137 136 137 137 136 137  ?K 3.6   < > 3.1*   < > 3.2* 3.7  3.8 2.8* 4.0 3.5 3.4* 2.6*  ?CL 99   < > 95*   < > 89* 91* 89* 91* 92* 88* 85*  ?CO2 26   < > 28   < > 35* 35* 37* 38* 35* 38* 41*  ?GLUCOSE 99   < > 107*   < > 169* 110* 119* 119* 159* 141* 122*  ?BUN 33*   < > 36*   < > 42* 38* 42* 39* 40* 43* 44*  ?CREATININE 0.86   < > 0.89   < > 1.23* 1.33* 1.31* 1.28* 1.31* 1.23* 1.22*  ?CALCIUM 9.2   < > 9.2   < > 9.1 9.3 9.1 9.2 9.0 9.1 9.1  ?MG 1.7   < > 2.0   < > 2.0 2.0  --  1.9  --  2.6* 2.2  ?PHOS 2.8  --  3.8  --   --  3.5  --  2.9  --  3.1  --   ? < > = values in this interval not displayed.  ? ? ?Liver Function Tests: ?No results for input(s): AST, ALT, ALKPHOS, BILITOT, PROT, ALBUMIN in the last 168 hours. ? ?No results for input(s): LIPASE, AMYLASE in the last 168 hours. ?No results for input(s): AMMONIA in the last 168 hours. ? ?CBC: ?Recent Labs  ?Lab 09/11/21 ?0407 09/12/21 ?0402 09/13/21 ?0424 09/14/21 ?0557303/26/23 ?0320  ?WBC 26.2*  23.0* 25.8* 27.8* 30.1*  ?NEUTROABS 22.8* 20.5* 21.4* 23.8* 26.0*  ?HGB 9.7* 9.6* 9.1* 8.9* 9.0*  ?HCT 31.1* 30.5* 30.0* 29.3* 29.5*  ?MCV 89.9 90.2 92.3 93.0 93.1  ?PLT 435* 439* 429* 451* 413*  ? ? ?INR: ?Recent Labs  ?Lab 09/11/21 ?0407 09/12/21 ?0402 09/13/21 ?0424 09/14/21 ?4315 09/15/21 ?0320  ?INR 1.8* 1.8* 2.3* 2.8* 2.3*  ? ? ?Other results: ?EKG:  ? ?Imaging: ?DG Chest Port 1 View ? ?Result Date: 09/15/2021 ?CLINICAL DATA:  Follow-up.  Left ventricular assist device. EXAM: PORTABLE CHEST 1 VIEW COMPARISON:  09/13/2021 and older studies. FINDINGS: Improved lung aeration with decreased interstitial and hazy airspace opacities compared to the most recent prior exam. Lung volumes remain low. Suspect small residual effusions. No pneumothorax. Left ventricular assist device is stable as is the left anterior chest wall AICD. IMPRESSION: 1. Mild interval  improvement in lung aeration consistent with improved pulmonary edema. 2. No other change.  Stable left ventricular assist device. Electronically Signed   By: Lajean Manes M.D.   On: 09/15/2021 08:10   ? ? ?Medications:   ? ? ?Scheduled Medications: ? acetaZOLAMIDE  250 mg Oral BID  ? aspirin EC  81 mg Oral Daily  ? Chlorhexidine Gluconate Cloth  6 each Topical Daily  ? digoxin  0.0625 mg Oral Daily  ? feeding supplement  237 mL Oral TID BM  ? gabapentin  100 mg Oral TID  ? insulin aspart  0-24 Units Subcutaneous TID WC & HS  ? methimazole  10 mg Oral Daily  ? methimazole  5 mg Oral QPM  ? midodrine  5 mg Oral TID WC  ? mirtazapine  7.5 mg Oral QHS  ? pantoprazole  40 mg Oral Daily  ? potassium chloride  40 mEq Oral TID  ? predniSONE  20 mg Oral Q breakfast  ? quiniDINE gluconate  324 mg Oral BID  ? rosuvastatin  40 mg Oral QHS  ? sildenafil  20 mg Oral TID  ? sodium chloride flush  10-40 mL Intracatheter Q12H  ? warfarin  2.5 mg Oral q1600  ? Warfarin - Physician Dosing Inpatient   Does not apply q1600  ? ? ?Infusions: ? sodium chloride    ? sodium chloride Stopped (09/09/21 0758)  ? sodium chloride    ? amiodarone 60 mg/hr (09/15/21 1124)  ? feeding supplement (PIVOT 1.5 CAL) 50 mL/hr at 09/15/21 1100  ? furosemide (LASIX) 200 mg in dextrose 5% 100 mL ('2mg'$ /mL) infusion 12 mg/hr (09/15/21 1100)  ? lactated ringers    ? lactated ringers Stopped (09/05/21 1010)  ? potassium chloride 10 mEq (09/15/21 1157)  ? ? ?PRN Medications: ?Place/Maintain arterial line **AND** sodium chloride, ALPRAZolam, loperamide HCl, ondansetron (ZOFRAN) IV, oxyCODONE, senna-docusate, sodium chloride, sodium chloride flush, sodium chloride flush, sodium chloride flush, traMADol ? ? ?Assessment:  ?History of nonischemic cardiomyopathy with recent readmission for worsening heart failure and cardiogenic shock ? ?Preoperative bridge with Impella 5.5 LVAD to stabilize hemodynamics and improve organ function. ? ?Patient developed significant  leukocytosis without fever and with negative cultures prior to implantation after steroid therapy initiated for hyperthyroid condition. ? ?Implantation of HeartMate 3 and removal of Impella 5.5 on September 03, 2021-perioperative coagulopathy and thrombocytopenia ? ? ?Plan/Discussion:   ?Continue IV amiodarone until Lasix drip is finished and hypokalemia induced A-fib will become less of an issue.  Patient had a good walk in the hallway earlier today. ? ?Coumadin 2.5 mg ordered for this p.m. ?Transfer to progressive care unit  early next week, change of setting should be good for the patient's emotional status. ? ?I reviewed the LVAD parameters from today, and compared the results to the patient's prior recorded data.  No programming changes were made.  The LVAD is functioning within specified parameters.  The patient performs LVAD self-test daily.  LVAD interrogation was negative for any significant power changes, alarms or PI events/speed drops.  LVAD equipment check completed and is in good working order.  Back-up equipment present.   LVAD education done on emergency procedures and precautions and reviewed exit site care. ? ?Length of Stay: 20 ? ?[name] ?09/15/2021, 12:14 PM  ?

## 2021-09-15 NOTE — Progress Notes (Signed)
Patient ID: [name], female   DOB: [birth year], 51 y.o.   MRN: [id number] ?HeartMate 2 Rounding Note ? ?Subjective:   ? ?- 3/14: HM3 LVAD placement with removal of Impella 5.5  ?- 3/15: Speed increased to 5300 rpm. Given 1UPRBC. Febrile. Cultures obtained. Extubated.  ?- 3/16 VT paced out. On amio drip. Given 1UPRBCs . Given couple doses of IV lasix.  ?- 3/18 Rapid ALF. Bolused multiple times with amio and drip turned up to 60. Now back in NSR ?- 3/19 VAD Speed increased to 5400 (high PI)  ?- 3/20 Recurrent ALF. Bolused w/ amio. Milrinone discontinued ?- 3/22 Ramp echo, Fixed Speed 5600  ? ?Co-ox 68% off milrinone. ? ?Patient is in NSR this morning.  She feels much better.  Remains on amiodarone 60 mg/hr.  Walked a full lap this morning.   ? ?Good diuresis again yesterday, net negative 1822.  Having a hard time keeping up with K (2.6 this morning).  Creatinine stable 1.22. CVP still 12-13. No weight recorded yet.  MAP lower today around 65.  ? ?LDH 498>>519>>534>>548>>533>>539>>534 ?INR 2.3 ? ?Now off abx. WBC 24K>25>30>32>26>23>26>27.8>>30K. No fevers. On po prednisone.  ? ?Getting TFs via [employer] ? ?LVAD INTERROGATION:  ?HeartMate II LVAD:  Flow 4.3 liters/min, speed 5600, power 4.5, PI 9 (rose when she walked this morning). No low flow alarms. VAD interrogated personally. Parameters stable. ? ? ?Objective:   ? ?Vital Signs:   ?Temp:  [97.6 ?F (36.4 ?C)-98.3 ?F (36.8 ?C)] 98.2 ?F (36.8 ?C) (03/25 1946) ?Pulse Rate:  [103] 103 (03/25 0935) ?Resp:  [18-33] 18 (03/26 0600) ?Arterial Line BP: (65-86)/(53-76) 69/60 (03/26 0600) ?Last BM Date : 09/14/21 ?Mean arterial Pressure  65 ? ?Intake/Output:  ? ?Intake/Output Summary (Last 24 hours) at 09/15/2021 0733 ?Last data filed at 09/15/2021 0500 ?Gross per 24 hour  ?Intake 1353.18 ml  ?Output 3175 ml  ?Net -1821.82 ml  ?  ?Physical exam ?General: Well appearing this am. NAD.  ?HEENT: Normal. ?Neck: Supple, JVP 12 cm. Carotids OK.  ?Cardiac:  Mechanical heart sounds with  LVAD hum present.  ?Lungs:  CTAB, normal effort.  ?Abdomen:  NT, ND, no HSM. No bruits or masses. +BS  ?LVAD exit site: Well-healed and incorporated. Dressing dry and intact. No erythema or drainage. Stabilization device present and accurately applied. Driveline dressing changed daily per sterile technique. ?Extremities:  Warm and dry. No cyanosis, clubbing, rash. 1+ ankle edema.  ?Neuro:  Alert & oriented x 3. Cranial nerves grossly intact. Moves all 4 extremities w/o difficulty. Affect pleasant   ? ?Telemetry: NSR 90s (personally reviewed) ? ?Labs: ?Basic Metabolic Panel: ?Recent Labs  ?Lab 09/09/21 ?0253 09/09/21 ?1320 09/10/21 ?3536 09/10/21 ?1200 09/13/21 ?0424 09/13/21 ?1436 09/14/21 ?1443 09/14/21 ?1540 09/14/21 ?0867 09/14/21 ?1629 09/15/21 ?0320  ?NA 135   < > 135   < > 136 137 136 137 137 136 137  ?K 3.6   < > 3.1*   < > 3.2* 3.7  3.8 2.8* 4.0 3.5 3.4* 2.6*  ?CL 99   < > 95*   < > 89* 91* 89* 91* 92* 88* 85*  ?CO2 26   < > 28   < > 35* 35* 37* 38* 35* 38* 41*  ?GLUCOSE 99   < > 107*   < > 169* 110* 119* 119* 159* 141* 122*  ?BUN 33*   < > 36*   < > 42* 38* 42* 39* 40* 43* 44*  ?CREATININE 0.86   < > 0.89   < >  1.23* 1.33* 1.31* 1.28* 1.31* 1.23* 1.22*  ?CALCIUM 9.2   < > 9.2   < > 9.1 9.3 9.1 9.2 9.0 9.1 9.1  ?MG 1.7   < > 2.0   < > 2.0 2.0  --  1.9  --  2.6* 2.2  ?PHOS 2.8  --  3.8  --   --  3.5  --  2.9  --  3.1  --   ? < > = values in this interval not displayed.  ? ? ?Liver Function Tests: ?No results for input(s): AST, ALT, ALKPHOS, BILITOT, PROT, ALBUMIN in the last 168 hours. ? ?No results for input(s): LIPASE, AMYLASE in the last 168 hours. ?No results for input(s): AMMONIA in the last 168 hours. ? ?CBC: ?Recent Labs  ?Lab 09/11/21 ?0407 09/12/21 ?0402 09/13/21 ?0424 09/14/21 ?6283 09/15/21 ?0320  ?WBC 26.2* 23.0* 25.8* 27.8* 30.1*  ?NEUTROABS 22.8* 20.5* 21.4* 23.8* 26.0*  ?HGB 9.7* 9.6* 9.1* 8.9* 9.0*  ?HCT 31.1* 30.5* 30.0* 29.3* 29.5*  ?MCV 89.9 90.2 92.3 93.0 93.1  ?PLT 435* 439* 429* 451*  413*  ? ? ?INR: ?Recent Labs  ?Lab 09/11/21 ?0407 09/12/21 ?0402 09/13/21 ?0424 09/14/21 ?1517 09/15/21 ?0320  ?INR 1.8* 1.8* 2.3* 2.8* 2.3*  ? ? ?Other results: ? ? ?Imaging: ?DG Abd Portable 1V ? ?Result Date: 09/13/2021 ?CLINICAL DATA:  Feeding tube placement. EXAM: PORTABLE ABDOMEN - 1 VIEW COMPARISON:  None. FINDINGS: The feeding tube terminates in the region of the gastric antrum. The chest is poorly evaluated. There is incompletely evaluated opacity in the right base. IMPRESSION: 1. The feeding tube is in stable position, terminating in the region the gastric antrum. 2. The chest is poorly evaluated. However, there is incompletely evaluated opacity suspected in the right base. Electronically Signed   By: Dorise Bullion III M.D.   On: 09/13/2021 10:43   ? ? ?Medications:   ? ? ?Scheduled Medications: ? acetaZOLAMIDE  250 mg Oral Daily  ? acetaZOLAMIDE  250 mg Oral BID  ? aspirin EC  81 mg Oral Daily  ? Chlorhexidine Gluconate Cloth  6 each Topical Daily  ? digoxin  0.0625 mg Oral Daily  ? feeding supplement  237 mL Oral TID BM  ? gabapentin  100 mg Oral TID  ? insulin aspart  0-24 Units Subcutaneous TID WC & HS  ? methimazole  10 mg Oral Daily  ? methimazole  5 mg Oral QPM  ? midodrine  5 mg Oral TID WC  ? mirtazapine  7.5 mg Oral QHS  ? pantoprazole  40 mg Oral Daily  ? potassium chloride  40 mEq Oral TID  ? predniSONE  20 mg Oral Q breakfast  ? quiniDINE gluconate  324 mg Oral BID  ? rosuvastatin  40 mg Oral QHS  ? sildenafil  20 mg Oral TID  ? sodium chloride flush  10-40 mL Intracatheter Q12H  ? spironolactone  25 mg Oral Daily  ? Warfarin - Physician Dosing Inpatient   Does not apply q1600  ? ? ?Infusions: ? sodium chloride    ? sodium chloride Stopped (09/09/21 0758)  ? sodium chloride    ? amiodarone 60 mg/hr (09/15/21 0538)  ? feeding supplement (PIVOT 1.5 CAL) 1,000 mL (09/14/21 1900)  ? furosemide (LASIX) 200 mg in dextrose 5% 100 mL ('2mg'$ /mL) infusion 12 mg/hr (09/15/21 0500)  ? lactated ringers    ?  lactated ringers Stopped (09/05/21 1010)  ? potassium chloride 100 mL/hr at 09/15/21 0500  ? ? ?PRN Medications: ?  Place/Maintain arterial line **AND** sodium chloride, ALPRAZolam, loperamide HCl, ondansetron (ZOFRAN) IV, oxyCODONE, senna-docusate, sodium chloride, sodium chloride flush, sodium chloride flush, sodium chloride flush, traMADol ? ? ? ?Plan/Discussion:   ? ? 1. Acute on chronic systolic HF: Nonischemic cardiomyopathy.  She has a [employer] ICD. Cause for cardiomyopathy is uncertain, initially identified peri-partum (after son's birth), so cannot rule out peri-partum CMP.  On and off, she has had frequent PVCs.   RHC in 8/15 showed normal filling pressures and preserved cardiac output. Echo in 4/22 with EF 25%, severe LV dilation.  CPX (8/22) with mild-moderate HF limitation.  Echo this admission with EF < 20% with severe dilation, normal RV size with mildly decreased systolic function, moderate-severe functional MR with dilated IVC. She was admitted with volume overload, NYHA class IV symptoms and low output. HCO3 18 and creatinine 1.5 on admit, co-ox 47%. Milrinone 0.25 started + lasix gtt.  I also suspect hyperthyroidism is playing a role in her CHF exacerbation. 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 Impella 5.5 placed 3/9.  Patient had HM3 LVAD placed 3/15. 3/15 Speed increased to 5300 rpm. 3/19 Speed increased to 5400. Ramp echo 3/22, fixed speed increased to 5600.  She is off pressors and milrinone.  Co-ox 68%, CVP 12-13 (lower) with good diuresis yesterday. LDH stable. Creatinine stable 1.22, HCO3 rising at 41. Having trouble keeping up with K. Feels better this morning in NSR. MAP running a bit lower today at 65.  ?- Continue Lasix gtt 12 mg/hr today, will give acetazolamide 250 mg bid but will hold off on metolazone today.  ?- Compression stockings ?- Hold spironolactone with soft BP.   ?-  Continue Digoxin 0.0625 mg  ?- Continue asa 81 mg daily. Will need long-term with h/o CVA ?- Continue sildenafil 20 mg tid  ?- Warfarin for VAD.  ?- Will use midodrine 5 mg tid for now with soft BP.  ?2.

## 2021-09-15 NOTE — Progress Notes (Signed)
Drive line dressing change: ? ?Existing VAD dressing removed and site care performed using proper sterile technique. Drive line rinsed with saline and site cleaned using chloraprep applicators x2. Allowed to dry fully. Silver strip and new gauze dressing applied using sterile technique. Drive line site clean, minimal drainage at site, no redness, no swelling, no tenderness, no odor from drainage. Single drive line anchor intact and applied correctly. Patient educated and engaged throughout process.  ?  ?NEXT DRESSING CHANGE DUE 09/16/2021 ?  ?

## 2021-09-15 NOTE — Progress Notes (Signed)
?   09/15/21 1800  ?Clinical Encounter Type  ?Visited With Patient;Health care provider  ?Visit Type Initial;Spiritual support  ?Referral From Nurse  ?Consult/Referral To Chaplain  ? ?Chaplain responded to a phone call requesting a visit with patient. Voncile was in good spirits and appreciative of my stopping by. She received news that she was going to move to the step down unit tomorrow.  This was truly wonderful news. Patient shared with me her challenges that brought her to the hospital. Her faith has only gotten stronger during her stay and facing many challenges as she heal her body. We prayed for her upcoming walk that she was going to do with the rehab team and her move tomorrow.  ? ?Danice Goltz ?Chaplain Resident  ?[facility] ?[phone] ?

## 2021-09-16 DIAGNOSIS — I509 Heart failure, unspecified: Secondary | ICD-10-CM | POA: Diagnosis not present

## 2021-09-16 DIAGNOSIS — I5043 Acute on chronic combined systolic (congestive) and diastolic (congestive) heart failure: Secondary | ICD-10-CM | POA: Diagnosis not present

## 2021-09-16 DIAGNOSIS — Z95811 Presence of heart assist device: Secondary | ICD-10-CM | POA: Diagnosis not present

## 2021-09-16 LAB — BASIC METABOLIC PANEL
Anion gap: 11 (ref 5–15)
Anion gap: 9 (ref 5–15)
BUN: 55 mg/dL — ABNORMAL HIGH (ref 6–20)
BUN: 56 mg/dL — ABNORMAL HIGH (ref 6–20)
CO2: 42 mmol/L — ABNORMAL HIGH (ref 22–32)
CO2: 39 mmol/L — ABNORMAL HIGH (ref 22–32)
Calcium: 8.9 mg/dL (ref 8.9–10.3)
Calcium: 9.2 mg/dL (ref 8.9–10.3)
Chloride: 84 mmol/L — ABNORMAL LOW (ref 98–111)
Chloride: 91 mmol/L — ABNORMAL LOW (ref 98–111)
Creatinine, Ser: 1.16 mg/dL — ABNORMAL HIGH (ref 0.44–1.00)
Creatinine, Ser: 1.22 mg/dL — ABNORMAL HIGH (ref 0.44–1.00)
GFR, Estimated: 57 mL/min — ABNORMAL LOW (ref >60)
GFR, Estimated: 54 mL/min — ABNORMAL LOW (ref >60)
Glucose, Bld: 113 mg/dL — ABNORMAL HIGH (ref 70–99)
Glucose, Bld: 122 mg/dL — ABNORMAL HIGH (ref 70–99)
Potassium: 2.2 mmol/L — CL (ref 3.5–5.1)
Potassium: 3.8 mmol/L (ref 3.5–5.1)
Sodium: 137 mmol/L (ref 135–145)
Sodium: 139 mmol/L (ref 135–145)

## 2021-09-16 LAB — LACTATE DEHYDROGENASE: LDH: 543 U/L — ABNORMAL HIGH (ref 98–192)

## 2021-09-16 LAB — GLUCOSE, CAPILLARY
Glucose-Capillary: 103 mg/dL — ABNORMAL HIGH (ref 70–99)
Glucose-Capillary: 128 mg/dL — ABNORMAL HIGH (ref 70–99)
Glucose-Capillary: 111 mg/dL — ABNORMAL HIGH (ref 70–99)
Glucose-Capillary: 116 mg/dL — ABNORMAL HIGH (ref 70–99)

## 2021-09-16 LAB — CBC WITH DIFFERENTIAL/PLATELET
Abs Immature Granulocytes: 0.85 10*3/uL — ABNORMAL HIGH (ref 0.00–0.07)
Basophils Absolute: 0.1 10*3/uL (ref 0.0–0.1)
Basophils Relative: 0 %
Eosinophils Absolute: 0 10*3/uL (ref 0.0–0.5)
Eosinophils Relative: 0 %
HCT: 29 % — ABNORMAL LOW (ref 36.0–46.0)
Hemoglobin: 8.9 g/dL — ABNORMAL LOW (ref 12.0–15.0)
Immature Granulocytes: 3 %
Lymphocytes Relative: 7 %
Lymphs Abs: 1.8 10*3/uL (ref 0.7–4.0)
MCH: 28.6 pg (ref 26.0–34.0)
MCHC: 30.7 g/dL (ref 30.0–36.0)
MCV: 93.2 fL (ref 80.0–100.0)
Monocytes Absolute: 1.2 10*3/uL — ABNORMAL HIGH (ref 0.1–1.0)
Monocytes Relative: 4 %
Neutro Abs: 23.2 10*3/uL — ABNORMAL HIGH (ref 1.7–7.7)
Neutrophils Relative %: 86 %
Platelets: 395 10*3/uL (ref 150–400)
RBC: 3.11 MIL/uL — ABNORMAL LOW (ref 3.87–5.11)
RDW: 22 % — ABNORMAL HIGH (ref 11.5–15.5)
Smear Review: ADEQUATE
WBC: 27.1 10*3/uL — ABNORMAL HIGH (ref 4.0–10.5)
nRBC: 1.4 % — ABNORMAL HIGH (ref 0.0–0.2)

## 2021-09-16 LAB — COOXEMETRY PANEL
Carboxyhemoglobin: 2.6 % — ABNORMAL HIGH (ref 0.5–1.5)
Methemoglobin: <0.7 % (ref 0.0–1.5)
O2 Saturation: 72.5 %
Total hemoglobin: 9.1 g/dL — ABNORMAL LOW (ref 12.0–16.0)

## 2021-09-16 LAB — PROTIME-INR
INR: 2 — ABNORMAL HIGH (ref 0.8–1.2)
Prothrombin Time: 22.6 seconds — ABNORMAL HIGH (ref 11.4–15.2)

## 2021-09-16 LAB — MAGNESIUM: Magnesium: 2 mg/dL (ref 1.7–2.4)

## 2021-09-16 MED ORDER — WARFARIN - PHARMACIST DOSING INPATIENT: Freq: Every day | Status: DC

## 2021-09-16 MED ORDER — MIDODRINE HCL 5 MG PO TABS
10.0000 mg | ORAL_TABLET | Freq: Three times a day (TID) | ORAL | Status: DC
  Administered 2021-09-16 – 2021-09-17 (×4): 10 mg via ORAL
  Filled 2021-09-16 (×4): qty 2

## 2021-09-16 MED ORDER — TORSEMIDE 20 MG PO TABS
40.0000 mg | ORAL_TABLET | Freq: Every day | ORAL | Status: DC
  Administered 2021-09-16 – 2021-09-18 (×3): 40 mg via ORAL
  Filled 2021-09-16 (×3): qty 2

## 2021-09-16 MED ORDER — POTASSIUM CHLORIDE 10 MEQ/50ML IV SOLN
10.0000 meq | INTRAVENOUS | Status: AC
  Administered 2021-09-16 (×4): 10 meq via INTRAVENOUS
  Filled 2021-09-16 (×4): qty 50

## 2021-09-16 MED ORDER — POTASSIUM CHLORIDE 20 MEQ PO PACK
40.0000 meq | PACK | Freq: Once | ORAL | Status: AC
  Administered 2021-09-16: 40 meq
  Filled 2021-09-16: qty 2

## 2021-09-16 MED ORDER — MIDODRINE HCL 5 MG PO TABS: 10.0000 mg | ORAL_TABLET | Freq: Three times a day (TID) | ORAL | Status: DC

## 2021-09-16 MED ORDER — WARFARIN SODIUM 3 MG PO TABS
3.0000 mg | ORAL_TABLET | Freq: Once | ORAL | Status: AC
  Administered 2021-09-16: 3 mg via ORAL
  Filled 2021-09-16: qty 1

## 2021-09-16 MED ORDER — NEPRO/CARBSTEADY PO LIQD
237.0000 mL | Freq: Three times a day (TID) | ORAL | Status: DC
  Administered 2021-09-16 (×2): 237 mL

## 2021-09-16 NOTE — Plan of Care (Signed)
?  Problem: Education: ?Goal: Ability to demonstrate management of disease process will improve ?Outcome: Progressing ?Goal: Ability to verbalize understanding of medication therapies will improve ?Outcome: Progressing ?  ?Problem: Activity: ?Goal: Capacity to carry out activities will improve ?Outcome: Progressing ?  ?Problem: Cardiac: ?Goal: Ability to achieve and maintain adequate cardiopulmonary perfusion will improve ?Outcome: Progressing ?  ?Problem: Education: ?Goal: Knowledge of General Education information will improve ?Description: Including pain rating scale, medication(s)/side effects and non-pharmacologic comfort measures ?Outcome: Progressing ?  ?Problem: Health Behavior/Discharge Planning: ?Goal: Ability to manage health-related needs will improve ?Outcome: Progressing ?  ?Problem: Clinical Measurements: ?Goal: Ability to maintain clinical measurements within normal limits will improve ?Outcome: Progressing ?Goal: Will remain free from infection ?Outcome: Progressing ?Goal: Diagnostic test results will improve ?Outcome: Progressing ?Goal: Respiratory complications will improve ?Outcome: Progressing ?Goal: Cardiovascular complication will be avoided ?Outcome: Progressing ?  ?Problem: Nutrition: ?Goal: Adequate nutrition will be maintained ?Outcome: Progressing ?  ?Problem: Activity: ?Goal: Risk for activity intolerance will decrease ?Outcome: Progressing ?  ?

## 2021-09-16 NOTE — Progress Notes (Signed)
Pt currently hospitalized.  Next ICM remote transmission was due 09/17/2021 and rescheduled for 09/30/2021.   ?

## 2021-09-16 NOTE — Progress Notes (Addendum)
Patient ID: [name], female   DOB: 13-Oct-1970, 51 y.o.   MRN: [id number] ?HeartMate 2 Rounding Note ? ?Subjective:   ? ?- 3/14: HM3 LVAD placement with removal of Impella 5.5  ?- 3/15: Speed increased to 5300 rpm. Given 1UPRBC. Febrile. Cultures obtained. Extubated.  ?- 3/16 VT paced out. On amio drip. Given 1UPRBCs . Given couple doses of IV lasix.  ?- 3/18 Rapid ALF. Bolused multiple times with amio and drip turned up to 60. Now back in NSR ?- 3/19 VAD Speed increased to 5400 (high PI)  ?- 3/20 Recurrent ALF. Bolused w/ amio. Milrinone discontinued ?- 3/22 Ramp echo, Fixed Speed 5600  ? ?Co-ox 73% this am off milrinone.  ? ?Maintaining SR. Remains on amiodarone 60 mg/hr. ?  ?Good diuresis yesterday with diamox 250 BID + lasix gtt at 12/hr. -3L. No weight charted over the weekend. CVP 10-12. ? ?Scr stable at 1.16, CO2 42, K 2.2 ? ?MAPs 60s, one down to 56 at 4 am. Up to 70 while I was in the room with her. ? ?LDH 498>>519>>534>>548>>533>>539>>534>>543 ?INR 2.0 ? ?Now off abx. WBC 24K>25>30>32>26>23>26>27.8>>30>>27K. No fevers. On po prednisone.  ? ?Getting TFs via [employer] ? ?Feeling stronger each day. Was able to eat a little more over the weekend. Notes occasional dyspnea. ? ?LVAD INTERROGATION:  ?HeartMate II LVAD:  Flow 4.9 liters/min, speed 5600, power 4, PI 3.5 Numerous PI events. VAD interrogated personally. Parameters stable. ? ? ?Objective:   ? ?Vital Signs:   ?Temp:  [97.8 ?F (36.6 ?C)-98.8 ?F (37.1 ?C)] 98.8 ?F (37.1 ?C) (03/26 2002) ?Pulse Rate:  [86] 86 (03/26 0944) ?Resp:  [19-31] 24 (03/27 0200) ?Arterial Line BP: (60-88)/(52-72) 70/60 (03/27 0500) ?Last BM Date : 09/15/21 ?Mean arterial Pressure  60s ? ?Intake/Output:  ? ?Intake/Output Summary (Last 24 hours) at 09/16/2021 0656 ?Last data filed at 09/16/2021 0200 ?Gross per 24 hour  ?Intake 1525.19 ml  ?Output 4670 ml  ?Net -3144.81 ml  ?  ?Physical exam ?GENERAL: Sitting up in bed. No distress. ?HEENT: normal  ?NECK: Supple, JVP 10-12 .  2+  bilaterally, no bruits.  ?CARDIAC:  Mechanical heart sounds with LVAD hum present.  ?LUNGS:  Clear to auscultation bilaterally.  ?ABDOMEN:  Soft, round, nontender, positive bowel sounds x4.     ?LVAD exit site: well-healed and incorporated.  Dressing dry and intact.  No erythema or drainage.  Stabilization device present and accurately applied.  Driveline dressing is being changed daily per sterile technique. ?EXTREMITIES:  Warm and dry, no cyanosis, clubbing, rash, + pedal edema ?NEUROLOGIC:  Alert and oriented x 4.  Gait steady.  No aphasia.  No dysarthria.  Affect pleasant.    ? ? ?Telemetry: NSR 70s, 2-5 PVCs/min (personally reviewed) ? ?Labs: ?Basic Metabolic Panel: ?Recent Labs  ?Lab 09/10/21 ?3329 09/10/21 ?1200 09/13/21 ?1436 09/14/21 ?0204 09/14/21 ?0431 09/14/21 ?5188 09/14/21 ?1629 09/15/21 ?0320 09/15/21 ?1429 09/16/21 ?0500  ?NA 135   < > 137   < > 137 137 136 137 135 137  ?K 3.1*   < > 3.7  3.8   < > 4.0 3.5 3.4* 2.6* 3.2* 2.2*  ?CL 95*   < > 91*   < > 91* 92* 88* 85* 86* 84*  ?CO2 28   < > 35*   < > 38* 35* 38* 41* 39* 42*  ?GLUCOSE 107*   < > 110*   < > 119* 159* 141* 122* 129* 113*  ?BUN 36*   < > 38*   < >  39* 40* 43* 44* 51* 55*  ?CREATININE 0.89   < > 1.33*   < > 1.28* 1.31* 1.23* 1.22* 1.18* 1.16*  ?CALCIUM 9.2   < > 9.3   < > 9.2 9.0 9.1 9.1 9.1 8.9  ?MG 2.0   < > 2.0  --  1.9  --  2.6* 2.2  --  2.0  ?PHOS 3.8  --  3.5  --  2.9  --  3.1  --   --   --   ? < > = values in this interval not displayed.  ? ? ?Liver Function Tests: ?No results for input(s): AST, ALT, ALKPHOS, BILITOT, PROT, ALBUMIN in the last 168 hours. ? ?No results for input(s): LIPASE, AMYLASE in the last 168 hours. ?No results for input(s): AMMONIA in the last 168 hours. ? ?CBC: ?Recent Labs  ?Lab 09/12/21 ?0402 09/13/21 ?0424 09/14/21 ?0431 09/15/21 ?0320 09/16/21 ?0500  ?WBC 23.0* 25.8* 27.8* 30.1* 27.1*  ?NEUTROABS 20.5* 21.4* 23.8* 26.0* 23.2*  ?HGB 9.6* 9.1* 8.9* 9.0* 8.9*  ?HCT 30.5* 30.0* 29.3* 29.5* 29.0*  ?MCV 90.2  92.3 93.0 93.1 93.2  ?PLT 439* 429* 451* 413* 395  ? ? ?INR: ?Recent Labs  ?Lab 09/12/21 ?0402 09/13/21 ?0424 09/14/21 ?0431 09/15/21 ?0320 09/16/21 ?0500  ?INR 1.8* 2.3* 2.8* 2.3* 2.0*  ? ? ?Other results: ? ? ?Imaging: ?DG Chest Port 1 View ? ?Result Date: 09/15/2021 ?CLINICAL DATA:  Follow-up.  Left ventricular assist device. EXAM: PORTABLE CHEST 1 VIEW COMPARISON:  09/13/2021 and older studies. FINDINGS: Improved lung aeration with decreased interstitial and hazy airspace opacities compared to the most recent prior exam. Lung volumes remain low. Suspect small residual effusions. No pneumothorax. Left ventricular assist device is stable as is the left anterior chest wall AICD. IMPRESSION: 1. Mild interval improvement in lung aeration consistent with improved pulmonary edema. 2. No other change.  Stable left ventricular assist device. Electronically Signed   By: Lajean Manes M.D.   On: 09/15/2021 08:10   ? ? ?Medications:   ? ? ?Scheduled Medications: ? aspirin EC  81 mg Oral Daily  ? Chlorhexidine Gluconate Cloth  6 each Topical Daily  ? digoxin  0.0625 mg Oral Daily  ? feeding supplement  237 mL Oral TID BM  ? gabapentin  100 mg Oral TID  ? insulin aspart  0-24 Units Subcutaneous TID WC & HS  ? methimazole  10 mg Oral Daily  ? methimazole  5 mg Oral QPM  ? midodrine  5 mg Oral TID WC  ? mirtazapine  7.5 mg Oral QHS  ? pantoprazole  40 mg Oral Daily  ? potassium chloride  40 mEq Oral TID  ? potassium chloride  40 mEq Per Tube Once  ? predniSONE  20 mg Oral Q breakfast  ? quiniDINE gluconate  324 mg Oral BID  ? rosuvastatin  40 mg Oral QHS  ? sildenafil  20 mg Oral TID  ? sodium chloride flush  10-40 mL Intracatheter Q12H  ? Warfarin - Physician Dosing Inpatient   Does not apply q1600  ? ? ?Infusions: ? sodium chloride    ? sodium chloride Stopped (09/09/21 0758)  ? sodium chloride    ? amiodarone 60 mg/hr (09/16/21 0655)  ? feeding supplement (PIVOT 1.5 CAL) 1,000 mL (09/15/21 1757)  ? furosemide (LASIX) 200 mg  in dextrose 5% 100 mL ('2mg'$ /mL) infusion 12 mg/hr (09/16/21 0654)  ? lactated ringers    ? lactated ringers Stopped (09/05/21 1010)  ? potassium chloride  10 mEq (09/16/21 0606)  ? ? ?PRN Medications: ?Place/Maintain arterial line **AND** sodium chloride, ALPRAZolam, loperamide HCl, ondansetron (ZOFRAN) IV, oxyCODONE, senna-docusate, sodium chloride, sodium chloride flush, sodium chloride flush, sodium chloride flush, traMADol ? ? ? ?Plan/Discussion:   ? ? 1. Acute on chronic systolic HF: Nonischemic cardiomyopathy.  She has a [employer] ICD. Cause for cardiomyopathy is uncertain, initially identified peri-partum (after son's birth), so cannot rule out peri-partum CMP.  On and off, she has had frequent PVCs.   RHC in 8/15 showed normal filling pressures and preserved cardiac output. Echo in 4/22 with EF 25%, severe LV dilation.  CPX (8/22) with mild-moderate HF limitation.  Echo this admission with EF < 20% with severe dilation, normal RV size with mildly decreased systolic function, moderate-severe functional MR with dilated IVC. She was admitted with volume overload, NYHA class IV symptoms and low output. HCO3 18 and creatinine 1.5 on admit, co-ox 47%. Milrinone 0.25 started + lasix gtt.  I also suspect hyperthyroidism is playing a role in her CHF exacerbation. 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 Impella 5.5 placed 3/9.  Patient had HM3 LVAD placed 3/15. 3/15 Speed increased to 5300 rpm. 3/19 Speed increased to 5400. Ramp echo 3/22, fixed speed increased to 5600.  She is off pressors and milrinone.   ?-Co-ox 73% off milrinone. Good diuresis yesterday, -3L. Need daily weights. ?- CVP 10-12. Numerous PI events overnight. Cut back diuresis. Stop lasix gtt. Will give 40 mg Torsemide today + 250 diamox BID.  CO2 42. ?- Notes a lot of pedal edema despite compression stockings. Will have UNNA boots placed. ?- Off  spiro with soft BP ?- Continue Digoxin 0.0625 mg  ?- Continue asa 81 mg daily. Will need long-term with h/o CVA ?- Continue sildenafil 20 mg tid  ?- Warfarin for VAD.  ?- Increase midodrine to 10 mg TID for soft B

## 2021-09-16 NOTE — Progress Notes (Signed)
Physical Therapy Treatment ?Patient Details ?Name: [name] ?MRN: [id number] ?DOB: 02-04-1971 ?Today's Date: 09/16/2021 ? ? ?History of Present Illness 51yo female adm 3/6 with acute on chronic heart failure exacerbation. Impella placed  3/9. LVAD HM 3 implanted 09/03/21. 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 ? ?  ?PT Comments  ? ? Patient progressing well towards PT goals. Reports feeling good today and eager to mobilize. Continues to require assist for bed mobility as well as standing transfers pending how fatigued she is and how low the surface is ranging anywhere from Min-Mod A. Improved ambulation distance with Min A and use EVA for support. Will begin transitioning pt to using RW for ambulation. Worked on strengthening of BLEs by performing functional sit to stands. VSS on RA, needed 3-4L/min 02 [place] for ambulation per pt request and required 2-3 standing rest breaks. Will continue to follow and progress as tolerated. ?  ?Recommendations for follow up therapy are one component of a multi-disciplinary discharge planning process, led by the attending physician.  Recommendations may be updated based on patient status, additional functional criteria and insurance authorization. ? ?Follow Up Recommendations ? Acute inpatient rehab (3hours/day) ?  ?  ?Assistance Recommended at Discharge Frequent or constant Supervision/Assistance  ?Patient can return home with the following A lot of help with bathing/dressing/bathroom;Assistance with cooking/housework;Direct supervision/assist for financial management;A lot of help with walking and/or transfers;Assist for transportation;Help with stairs or ramp for entrance ?  ?Equipment Recommendations ? Rollator (4 wheels);BSC/3in1  ?  ?Recommendations for Other Services   ? ? ?  ?Precautions / Restrictions Precautions ?Precautions: Fall;Sternal ?Precaution Booklet Issued: No ?Precaution Comments: LVAD, sternal, watch HR, NG tube ?Restrictions ?Weight  Bearing Restrictions: Yes ?Other Position/Activity Restrictions: sternal precautions  ?  ? ?Mobility ? Bed Mobility ?Overal bed mobility: Needs Assistance ?Bed Mobility: Rolling, Sidelying to Sit ?Rolling: Min assist ?Sidelying to sit: HOB elevated, Min assist ?  ?  ?  ?General bed mobility comments: Cues for technique and to adhere to sternal precautions, assist with trunk to get to EOB. ?  ? ?Transfers ?Overall transfer level: Needs assistance ?Equipment used: None ?Transfers: Sit to/from Stand ?Sit to Stand: Min assist, +2 physical assistance, Mod assist ?  ?  ?  ?  ?  ?General transfer comment: Min A to rise from EOB x1; Min A of 2 to stand initially from chair digressing to mod A of 2 when fatigued with cues for anterior weight shift. Unable to get upright on first 2 attempts from chair post walk. ?  ? ?Ambulation/Gait ?Ambulation/Gait assistance: Min assist, +2 safety/equipment ?Gait Distance (Feet): 370 Feet ?Assistive device: Ethelene Hal ?Gait Pattern/deviations: Step-through pattern, Decreased stride length, Trunk flexed, Wide base of support ?Gait velocity: decreased ?Gait velocity interpretation: 1.31 - 2.62 ft/sec, indicative of limited community ambulator ?  ?General Gait Details: Slow, mildly unsteady gait with truncal sway noted esp when fatigued. A few standign rest bresks. Needs 3L 02 [place] for ambulation, asked to increase to 4L during walk due to anxiety. ? ? ?Stairs ?  ?  ?  ?  ?  ? ? ?Wheelchair Mobility ?  ? ?Modified Rankin (Stroke Patients Only) ?  ? ? ?  ?Balance Overall balance assessment: Needs assistance ?Sitting-balance support: Feet supported, No upper extremity supported ?Sitting balance-Leahy Scale: Fair ?  ?  ?Standing balance support: During functional activity, Reliant on assistive device for balance ?Standing balance-Leahy Scale: Poor ?Standing balance comment: Reliant on UE support ?  ?  ?  ?  ?  ?  ?  ?  ?  ?  ?  ?  ? ?  ?  Cognition Arousal/Alertness: Awake/alert ?Behavior During  Therapy: Anxious ?Overall Cognitive Status: Within Functional Limits for tasks assessed ?  ?  ?  ?  ?  ?  ?  ?  ?  ?  ?  ?  ?  ?  ?  ?  ?General Comments: continued cues for sternal precautions; anxiety better today ?  ?  ? ?  ?Exercises Other Exercises ?Other Exercises: Sit to stand x4 from chair needing Min-Mod A of 2 post walk. ? ?  ?General Comments General comments (skin integrity, edema, etc.): HR stable. Sp02 3-4L/min 02 [place] during walking. Requires Min-Mod A to change to/from batteries and to bring black back during walk. ?  ?  ? ?Pertinent Vitals/Pain Pain Assessment ?Pain Assessment: No/denies pain  ? ? ?Home Living   ?  ?  ?  ?  ?  ?  ?  ?  ?  ?   ?  ?Prior Function    ?  ?  ?   ? ?PT Goals (current goals can now be found in the care plan section) Progress towards PT goals: Progressing toward goals ? ?  ?Frequency ? ? ? Min 4X/week ? ? ? ?  ?PT Plan Current plan remains appropriate  ? ? ?Co-evaluation   ?  ?  ?  ?  ? ?  ?AM-PAC PT "6 Clicks" Mobility   ?Outcome Measure ? Help needed turning from your back to your side while in a flat bed without using bedrails?: A Lot ?Help needed moving from lying on your back to sitting on the side of a flat bed without using bedrails?: A Lot ?Help needed moving to and from a bed to a chair (including a wheelchair)?: A Little ?Help needed standing up from a chair using your arms (e.g., wheelchair or bedside chair)?: A Lot ?Help needed to walk in hospital room?: A Little ?Help needed climbing 3-5 steps with a railing? : Total ?6 Click Score: 13 ? ?  ?End of Session Equipment Utilized During Treatment: Oxygen ?Activity Tolerance: Patient tolerated treatment well ?Patient left: in chair;with call [place]/phone within reach;with family/visitor present ?Nurse Communication: Mobility status ?PT Visit Diagnosis: Other abnormalities of gait and mobility (R26.89);Muscle weakness (generalized) (M62.81);Unsteadiness on feet (R26.81);Difficulty in walking, not elsewhere classified  (R26.2) ?  ? ? ?Time: 6226-3335 ?PT Time Calculation (min) (ACUTE ONLY): 42 min ? ?Charges:  $Gait Training: 23-37 mins ?$Therapeutic Activity: 8-22 mins          ?          ? ?Marisa Severin, PT, DPT ?Acute Rehabilitation Services ?Pager [phone] ?Office [phone] ? ? ? ? ? ?[place] ?09/16/2021, 1:13 PM ? ?

## 2021-09-16 NOTE — Progress Notes (Signed)
LVAD Coordinator Rounding Note: ? ?Admitted 08/26/21 due to acute on chronic systolic heart failure. Receiving ongoing treatment for hyperthyroidism with Methimazole and steroids. On 08/29/21 Impella 5.5 placed.  ? ?HM III LVAD implanted on 09/03/21 by Dr Prescott Gum under destination therapy criteria due to elevated BMI.  ? ?Pt lying in bed this morning on my arrival. Pt states that she has to stay in bed for now due to "my electrolyte issues." Pt states she is happy that she may be moving to the step down today.  ? ?WBC 27.1 today; afebrile. Completed Cefepime for positive urine culture. She remains on methimazole and PO Prednisone for hyperthyroidism. Will need endocrinology f/u appt at discharge.  ? ?Pt states that she is having trouble using the battery holster when she is walking so she has been putting her controller and batteries in a pillow case and putting it on her arm. I will bring up a consolidated bag today for her to try. ? ?Pts home equipment ordered today.  ? ?Vital signs: ?Temp: 98.8 ?HR: 79 NSR ?Doppler Pressure: not documented ?Arterial BP: 71/63 (67) ?O2 Sat: 97% 3 L  ?Wt: 243.2>244.7>248>242.6>242.5>241.2>238.1  lbs ? ?LVAD interrogation:  ?Speed: 5600 ?Flow: 4.9 ?Power: 4.2 w ?PI: 2.7 ? ?Alarms: none ?Events: 100+ today ? ?Hematocrit: 29 ?Fixed speed: 5600 ?Low speed limit: 5300 ? ?Drive Line: Existing VAD dressing removed and site care performed using sterile technique. Drive line exit site cleaned with Chlora prep applicators x 2, RINSED WITH SALINE, allowed to dry, and gauze dressing with Silver strip applied. Exit site healing and unincorporated, the velour is fully implanted at exit site. 2 sutures intact. Moderate amount of serosanguinous drainage. No redness, tenderness, foul odor or rash noted. Drive line anchor secure. Pt will need daily dressing changes using daily kit per VAD coordinator or nurse champion. Next dressing change due: 09/17/21.  ? ? ? ? ?Labs:  ?LDH trend:  391>485>519>534>530>543 ? ?INR trend: 0.8>1.6>2.9>2.2>2.3>2.0 ? ?WBC trend: 25.1>29.5>30.7>32.4>25.8>27.1 ? ?Hgb trend: 6.5>7.5>8.0>9.6>9.5>9.1>8.9 ? ?Anticoagulation Plan: ?-INR Goal: 2.0 - 2.5 ?-ASA Dose: 81 mg  ? ?Blood Products:  ?IntraOp: ? 4 PRBCs ? 4 FFP ? 2 Cryo ? 2 platelets ?DDAVP ? 1,400 cell saver ?Post Op: ?09/03/21: Factor 7 ? 1 PRBC ? 1 Platelet ?09/04/21: 1 PRBC ?09/05/21: 1 PRBC ? ?Device: ?-Medtronic  ?-Therapies: OFF ? 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 ?Respiratory: extubated 09/04/21  ? ?Infection: ?08/31/21>> blood cultures>> no growth 5 days; final ?09/04/21>> blood cultures>> no growth 5 days; final ?09/04/21>> sputum culture>> moderate WBC, no organisms seen; rare candida albicans ?09/04/21>> urine culture>> enterococcus faecalis; final ? ?Drips: ?Amiodarone 60 mg/hr ?Lasix 12 mg/hr-off ? ?Patient Education: ?Encouraged pt to review HeartMate III Handbook, and pt discharge binder.  ?Discussed Coumadin and INR management. Discussed need for consistency in Vitamin K rich foods. Pt interested in home INR machine when appropriate. ?Discussed nosebleed management ?Discussed cough/cold medications that may be used ?Briefly discussed home red folder flow-sheets (need to bring to clinic each visit, fill out daily) ?Practiced sterile glove don/doff with [name]. He successfully return demonstrated. He will need size 8 gloves. Provided him with a few pairs to practice with at home over the weekend.  ?Provided with information about VAD support group ? ?Plan/Recommendations:  ?1. Page VAD coordinators with VAD equipment and drive line concerns. ?2. Daily drive line dressing change per VAD coordinator or nurse champion using daily kit. ? ?Tanda Rockers RN ?VAD Coordinator  ?Office: [phone]  ?24/7  Pager: [phone]  ? ? ?  ?

## 2021-09-16 NOTE — Plan of Care (Signed)
?  Problem: Education: ?Goal: Ability to demonstrate management of disease process will improve ?Outcome: Progressing ?Goal: Ability to verbalize understanding of medication therapies will improve ?Outcome: Progressing ?  ?Problem: Activity: ?Goal: Capacity to carry out activities will improve ?Outcome: Progressing ?  ?Problem: Cardiac: ?Goal: Ability to achieve and maintain adequate cardiopulmonary perfusion will improve ?Outcome: Progressing ?  ?Problem: Education: ?Goal: Knowledge of General Education information will improve ?Description: Including pain rating scale, medication(s)/side effects and non-pharmacologic comfort measures ?Outcome: Progressing ?  ?Problem: Health Behavior/Discharge Planning: ?Goal: Ability to manage health-related needs will improve ?Outcome: Progressing ?  ?Problem: Clinical Measurements: ?Goal: Ability to maintain clinical measurements within normal limits will improve ?Outcome: Progressing ?Goal: Will remain free from infection ?Outcome: Progressing ?Goal: Diagnostic test results will improve ?Outcome: Progressing ?Goal: Respiratory complications will improve ?Outcome: Progressing ?Goal: Cardiovascular complication will be avoided ?Outcome: Progressing ?  ?Problem: Activity: ?Goal: Risk for activity intolerance will decrease ?Outcome: Progressing ?  ?

## 2021-09-16 NOTE — Progress Notes (Signed)
Inpatient Rehabilitation Admissions Coordinator  ? ?I continue to follow her progress to assist with planning rehab venue when appropriate. ? ?Danne Baxter, RN, MSN ?Rehab Admissions Coordinator ?[phone] ?09/16/2021 3:05 PM ? ?

## 2021-09-16 NOTE — Progress Notes (Signed)
CSW met with patient and husband at bedside. Patient states she had a better weekend and in good spirits. CSW informed husband of support group tomorrow and encouraged him to attend. CSW continues to follow throughout implant hospitalization. Raquel Sarna, [place], [place] ? ?

## 2021-09-16 NOTE — TOC Initial Note (Signed)
Transition of Care (TOC) - Initial/Assessment Note  ? ? ?Patient Details  ?Name: [name] ?MRN: [id number] ?Date of Birth: Feb 08, 1971 ? ?Transition of Care (TOC) CM/SW Contact:    ?Graves-Bigelow, Ocie Cornfield, RN ?Phone Number: ?09/16/2021, 11:37 AM ? ?Clinical Narrative:  Case Manager following for disposition needs. Inpatient Rehab Coordinator following for CIR. Case Manager will continue to follow as the patient progresses.            ? ? ?Expected Discharge Plan: [place] ?Barriers to Discharge: Continued Medical Work up ? ? ?Patient Goals and CMS Choice ?Patient states their goals for this hospitalization and ongoing recovery are:: patient states she wants to get stronger and well ?CMS Medicare.gov Compare Post Acute Care list provided to:: Patient ?  ? ?Expected Discharge Plan and Services ?Expected Discharge Plan: [place] ?  ?Discharge Planning Services: CM Consult ?  ?Living arrangements for the past 2 months: [place] ?                ? ?Prior Living Arrangements/Services ?Living arrangements for the past 2 months: [place] ?Lives with:: Spouse ?Patient language and need for interpreter reviewed:: Yes ?Do you feel safe going back to the place where you live?: Yes      ?Need for Family Participation in Patient Care: Yes (Comment) ?Care giver support system in place?: Yes (comment) ?  ?Criminal Activity/Legal Involvement Pertinent to Current Situation/Hospitalization: No - Comment as needed ? ?Activities of Daily Living ?Home Assistive Devices/Equipment: CPAP, Eyeglasses ?ADL Screening (condition at time of admission) ?Patient's cognitive ability adequate to safely complete daily activities?: Yes ?Is the patient deaf or have difficulty hearing?: No ?Does the patient have difficulty seeing, even when wearing glasses/contacts?: No ?Does the patient have difficulty concentrating, remembering, or making decisions?: No ?Patient able to express need for assistance with ADLs?:  Yes ?Does the patient have difficulty dressing or bathing?: No ?Independently performs ADLs?: Yes (appropriate for developmental age) ?Does the patient have difficulty walking or climbing stairs?: Yes ?Weakness of Legs: Both ?Weakness of Arms/Hands: Both ? ?Permission Sought/Granted ?Permission sought to share information with : Case Manager, Family Supports, PCP ?Permission granted to share information with : Yes, Verbal Permission Granted ? Share Information with NAME: Cree Napoli ? Permission granted to share info w AGENCY: IP rehab, Home health, DME ? Permission granted to share info w Relationship: husband ? Permission granted to share info w Contact Information: [phone] ? ?Emotional Assessment ?Appearance:: Appears younger than stated age ?Attitude/Demeanor/Rapport: Gracious ?Affect (typically observed): Accepting ?Orientation: : Oriented to Self, Oriented to Place, Oriented to  Time, Oriented to Situation ?  ?Psych Involvement: No (comment) ? ?Admission diagnosis:  Decompensated heart failure ([place]) [I50.9] ?Patient Active Problem List  ? Diagnosis Date Noted  ? Malnutrition of moderate degree 08/30/2021  ? Arterial line in place   ? Decompensated heart failure ([place]) 08/26/2021  ? Acute CHF (congestive heart failure) ([place]) 07/18/2021  ? 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  ? Stroke (cerebrum) ([place]) 04/17/2020  ? 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 ICD (implantable cardioverter-defibrillator) discharge 09/27/2018  ? Vitamin D deficiency 10/19/2017  ? Insulin resistance 10/19/2017  ? Hypotension 06/03/2016  ? Volume depletion 06/03/2016  ? Preoperative cardiovascular examination 10/25/2014  ? Hypothyroidism 04/26/2014  ? Pulmonary hypertension ([place]) 02/07/2014  ? Sprint Fidelis ICD lead RECALL  [phone]   ? UARS (upper airway resistance syndrome) 09/04/2011  ? Encounter for fitting or adjustment of implantable cardioverter-defibrillator (  ICD)   ? Obesity 03/12/2009  ?  NICM (nonischemic cardiomyopathy) ([place]) 03/12/2009  ? SYSTOLIC HEART FAILURE, CHRONIC 03/12/2009  ? ?PCP:  Patient, No Pcp Per (Inactive) ?Pharmacy:   ?[facility] [address] ?[place] [address] ?Phone: [phone] Fax: [phone] ? ?[facility] ?[address] ?[place] [address] ?Phone: [phone] Fax: [phone] ? ? ? ? ?Social Determinants of Health (SDOH) Interventions ?Food Insecurity Interventions: Intervention Not Indicated ?Financial Strain Interventions: Intervention Not Indicated ?Housing Interventions: Intervention Not Indicated ?Transportation Interventions: Intervention Not Indicated ? ?Readmission Risk Interventions ? ?  07/19/2021  ?  3:49 PM 04/20/2020  ?  2:17 PM  ?Readmission Risk Prevention Plan  ?Transportation Screening Complete Complete  ?PCP or Specialist Appt within 5-7 Days Complete Complete  ?Home Care Screening Complete Complete  ?Medication Review (RN CM) Complete Complete  ? ? ? ?

## 2021-09-16 NOTE — Progress Notes (Signed)
ANTICOAGULATION CONSULT NOTE - Initial Consult ? ?Pharmacy Consult for warfarin ?Indication:  LVAD HM3 ? ?No Known Allergies ? ?Patient Measurements: ?Height: '5\' 3"'$  (160 cm) ?Weight: 108 kg (238 lb 1.6 oz) ?IBW/kg (Calculated) : 52.4 ? ?Vital Signs: ?  ? ?Labs: ?Recent Labs  ?  09/14/21 ?0431 09/14/21 ?7425 09/15/21 ?0320 09/15/21 ?1429 09/16/21 ?0500  ?HGB 8.9*  --  9.0*  --  8.9*  ?HCT 29.3*  --  29.5*  --  29.0*  ?PLT 451*  --  413*  --  395  ?LABPROT 29.4*  --  25.7*  --  22.6*  ?INR 2.8*  --  2.3*  --  2.0*  ?CREATININE 1.28*   < > 1.22* 1.18* 1.16*  ? < > = values in this interval not displayed.  ? ? ?Estimated Creatinine Clearance: 68.3 mL/min (A) (by C-G formula based on SCr of 1.16 mg/dL (H)). ? ? ?Medical History: ?Past Medical History:  ?Diagnosis Date  ? Automatic implantable cardioverter-defibrillator in situ   ? Chronic CHF (congestive heart failure) (HCC)   ? a. EF 15-20% b. RHC (09/2013) RA 14, RV 57/22, PA 64/36 (48), PCWP 18, FIck CO/CI 3.7/1.6, PVR 8.1 WU, PA sat 47%   ? History of stomach ulcers   ? Hypertension   ? Hypotension   ? Hypothyroidism   ? Morbid obesity ([place])   ? Myocardial infarction [facility]) 08/2013  ? Nocturnal dyspnea   ? Nonischemic cardiomyopathy ([place])   ? Sinus tachycardia   ? Sleep apnea   ? Snoring-prob OSA 09/04/2011  ? Sprint Fidelis ICD lead RECALL  [phone]   ? UARS (upper airway resistance syndrome) 09/04/2011  ? HST 12/2013:  AHI 4/hr (numerous episodes of airflow reduction that did not have concomitant desaturation)   ? ? ?Medications:  ?Scheduled:  ? aspirin EC  81 mg Oral Daily  ? Chlorhexidine Gluconate Cloth  6 each Topical Daily  ? digoxin  0.0625 mg Oral Daily  ? feeding supplement  237 mL Oral TID BM  ? gabapentin  100 mg Oral TID  ? insulin aspart  0-24 Units Subcutaneous TID WC & HS  ? methimazole  10 mg Oral Daily  ? methimazole  5 mg Oral QPM  ? midodrine  10 mg Oral TID WC  ? mirtazapine  7.5 mg Oral QHS  ? pantoprazole  40 mg Oral Daily  ? potassium chloride  40 mEq  Oral TID  ? predniSONE  20 mg Oral Q breakfast  ? quiniDINE gluconate  324 mg Oral BID  ? rosuvastatin  40 mg Oral QHS  ? sildenafil  20 mg Oral TID  ? sodium chloride flush  10-40 mL Intracatheter Q12H  ? torsemide  40 mg Oral Daily  ? ? ?Assessment: ?51 yo F who is s/p HM3 LVAD placement on 3/14. Patient has been started on warfarin and baby aspirin. Baby aspirin will be continued indefinitely given prior CVA. Average warfarin dose over the past 6 days is ~2.5 mg. INR has been in range and is 2.0 today. Given INR on lower end and patient now eating more with CoreTrack placed on Friday will give slightly higher dose of 3 mg tonight.  ? ?Goal of Therapy:  ?INR 2-2.5 ?Monitor platelets by anticoagulation protocol: Yes ?  ?Plan:  ?Warfarin 3 mg tonight ?Daily INR and CBC ? ?Cathrine Muster, PharmD ?PGY2 Cardiology Pharmacy Resident ?Phone: [phone] ?09/16/2021  9:26 AM ? ?Please check [email or web address] for unit-specific pharmacy phone numbers. ? ?[name] ?09/16/2021,9:14  AM ? ? ?

## 2021-09-16 NOTE — Progress Notes (Signed)
Nutrition Follow-up ? ?DOCUMENTATION CODES:  ? ?Obesity unspecified, Non-severe (moderate) malnutrition in context of chronic illness ? ?INTERVENTION:  ? ?Tube Feeding via Cortrak:  ?Pivot 1.5 at 50 ml/hr ?This provide 113 g of protein, 1800 kcals, 912 mL of free water ? ?Continue Regular diet ? ?Change supplement to Nepro Shake po TID, each supplement provides 425 kcal and 19 grams protein, per pt preference ? ?D/C Ensure Enlive ? ?NUTRITION DIAGNOSIS:  ? ?Moderate Malnutrition related to chronic illness as evidenced by moderate muscle depletion, mild fat depletion, energy intake < 75% for > or equal to 1 month. ? ?Being addressed via TF ? ?GOAL:  ? ?Patient will meet greater than or equal to 90% of their needs ? ?Progessing ? ?MONITOR:  ? ?PO intake, Supplement acceptance, Labs, Weight trends ? ?REASON FOR ASSESSMENT:  ? ?Consult ?LVAD Eval ? ?ASSESSMENT:  ? ?51 yo female admitted with acute on chronic CHF. PMH includes NICM EF 20-25%, CKD 3, CVA, HTN, HLD ? ?3/08 RHC: low cardiac output with elevated right/left filling pressure ?3/09 s/p Impella 5.5 placement ?3/10 Cortrak placed (tip in distal stomach) ?3/14 LVAD placed, Cortrak came out during surgery ?3/15 Extubated ?3/24 Cortrak replaced, TF restarted ? ?Pt up walking around the unit with PT this AM.  ?Noted possible transfer to step down today.  ? ?Pt feels good, pt reports no issues with TF. Tolerating Pivot 1.5 at goal rate of via Cortrak tube ? ?Appetite remains poor, still complains of altered taste. Reports the only things that taste good still are bananas and chocolate.  ?Pt ate a banana and the raisins out of her raisin bran this AM. Limited documentation of po intake. Pt ate 5% at breakfast yesterday, nothing else recorded from that day. Nothing recorded for 3//25; pt ate 10% at breakfast, 10% at lunch and 50% at dinner on 3/24.  ? ?Pt reports she now prefers Nepro shake to Ensure; pt likes Butter Pecan the best but if this is not available, she will  drink the Mixed Berry. RD to change supplement ? ?Current wt 108 kg; back down to weight day of surgery. Noted lowest wt 100.7 kg. Pt still with some edema on exam but improving.  ? ?Labs: potassium 2.2 (L) ?Meds: remeron, ss novolog, potassium chloride, imodium prn, prednisone,  ? ?Diet Order:   ?Diet Order   ? ?       ?  Diet regular Room service appropriate? Yes; Fluid consistency: Thin  Diet effective now       ?  ? ?  ?  ? ?  ? ? ?EDUCATION NEEDS:  ? ?Education needs have been addressed ? ?Skin:  Skin Assessment: Skin Integrity Issues: ?Skin Integrity Issues:: Incisions, Other (Comment) ?Incisions: closed incision to chest ?Other: new LVAD, driveline ? ?Last BM:  3/27 ? ?Height:  ? ?Ht Readings from Last 1 Encounters:  ?09/12/21 '5\' 3"'$  (1.6 m)  ? ? ?Weight:  ? ?Wt Readings from Last 1 Encounters:  ?09/15/21 108 kg  ? ? ?BMI:  Body mass index is 42.18 kg/m?. ? ?Estimated Nutritional Needs:  ? ?Kcal:  1800-2000 kcals ? ?Protein:  100-115 g ? ?Fluid:  2L ? ?Kerman Passey MS, RDN, LDN, CNSC ?Registered Dietitian III ?Clinical Nutrition ?RD Pager and On-Call Pager Number Located in [place]  ? ?

## 2021-09-16 NOTE — Progress Notes (Signed)
The chaplain shared a smile and a wave with the Pt. The Pt. is with the LVAD coordinator and family at time of visit. This chaplain will F/U on Tuesday. ? ?Chaplain Sallyanne Kuster ?[phone] ?

## 2021-09-17 ENCOUNTER — Ambulatory Visit: Payer: Medicare Other | Admitting: Family

## 2021-09-17 ENCOUNTER — Encounter (HOSPITAL_COMMUNITY): Payer: Medicare Other | Admitting: Cardiology

## 2021-09-17 DIAGNOSIS — I428 Other cardiomyopathies: Secondary | ICD-10-CM | POA: Diagnosis not present

## 2021-09-17 DIAGNOSIS — R570 Cardiogenic shock: Secondary | ICD-10-CM | POA: Diagnosis not present

## 2021-09-17 DIAGNOSIS — Z515 Encounter for palliative care: Secondary | ICD-10-CM | POA: Diagnosis not present

## 2021-09-17 DIAGNOSIS — Z95811 Presence of heart assist device: Secondary | ICD-10-CM | POA: Diagnosis not present

## 2021-09-17 DIAGNOSIS — I509 Heart failure, unspecified: Secondary | ICD-10-CM | POA: Diagnosis not present

## 2021-09-17 DIAGNOSIS — I5023 Acute on chronic systolic (congestive) heart failure: Secondary | ICD-10-CM | POA: Diagnosis not present

## 2021-09-17 LAB — URINALYSIS, COMPLETE (UACMP) WITH MICROSCOPIC
Bilirubin Urine: NEGATIVE
Glucose, UA: NEGATIVE mg/dL
Ketones, ur: NEGATIVE mg/dL
Nitrite: NEGATIVE
Protein, ur: NEGATIVE mg/dL
Specific Gravity, Urine: 1.012 (ref 1.005–1.030)
pH: 7 (ref 5.0–8.0)

## 2021-09-17 LAB — BASIC METABOLIC PANEL
Anion gap: 8 (ref 5–15)
Anion gap: 9 (ref 5–15)
BUN: 58 mg/dL — ABNORMAL HIGH (ref 6–20)
BUN: 48 mg/dL — ABNORMAL HIGH (ref 6–20)
CO2: 38 mmol/L — ABNORMAL HIGH (ref 22–32)
CO2: 32 mmol/L (ref 22–32)
Calcium: 9 mg/dL (ref 8.9–10.3)
Calcium: 8.4 mg/dL — ABNORMAL LOW (ref 8.9–10.3)
Chloride: 92 mmol/L — ABNORMAL LOW (ref 98–111)
Chloride: 99 mmol/L (ref 98–111)
Creatinine, Ser: 1.04 mg/dL — ABNORMAL HIGH (ref 0.44–1.00)
Creatinine, Ser: 1.01 mg/dL — ABNORMAL HIGH (ref 0.44–1.00)
GFR, Estimated: >60 mL/min (ref >60)
GFR, Estimated: >60 mL/min (ref >60)
Glucose, Bld: 103 mg/dL — ABNORMAL HIGH (ref 70–99)
Glucose, Bld: 155 mg/dL — ABNORMAL HIGH (ref 70–99)
Potassium: 3.3 mmol/L — ABNORMAL LOW (ref 3.5–5.1)
Potassium: 5.4 mmol/L — ABNORMAL HIGH (ref 3.5–5.1)
Sodium: 138 mmol/L (ref 135–145)
Sodium: 140 mmol/L (ref 135–145)

## 2021-09-17 LAB — COOXEMETRY PANEL
Carboxyhemoglobin: 2.8 % — ABNORMAL HIGH (ref 0.5–1.5)
Carboxyhemoglobin: 2.5 % — ABNORMAL HIGH (ref 0.5–1.5)
Methemoglobin: <0.7 % (ref 0.0–1.5)
Methemoglobin: <0.7 % (ref 0.0–1.5)
O2 Saturation: 95.4 %
O2 Saturation: 70.5 %
Total hemoglobin: <6.9 g/dL — CL (ref 12.0–16.0)
Total hemoglobin: 8.9 g/dL — ABNORMAL LOW (ref 12.0–16.0)

## 2021-09-17 LAB — GLUCOSE, CAPILLARY
Glucose-Capillary: 114 mg/dL — ABNORMAL HIGH (ref 70–99)
Glucose-Capillary: 93 mg/dL (ref 70–99)
Glucose-Capillary: >600 mg/dL (ref 70–99)
Glucose-Capillary: 137 mg/dL — ABNORMAL HIGH (ref 70–99)
Glucose-Capillary: 126 mg/dL — ABNORMAL HIGH (ref 70–99)
Glucose-Capillary: 84 mg/dL (ref 70–99)
Glucose-Capillary: 104 mg/dL — ABNORMAL HIGH (ref 70–99)

## 2021-09-17 LAB — CBC WITH DIFFERENTIAL/PLATELET
Abs Immature Granulocytes: 0.62 10*3/uL — ABNORMAL HIGH (ref 0.00–0.07)
Basophils Absolute: 0.1 10*3/uL (ref 0.0–0.1)
Basophils Relative: 0 %
Eosinophils Absolute: 0 10*3/uL (ref 0.0–0.5)
Eosinophils Relative: 0 %
HCT: 32 % — ABNORMAL LOW (ref 36.0–46.0)
Hemoglobin: 9.4 g/dL — ABNORMAL LOW (ref 12.0–15.0)
Immature Granulocytes: 3 %
Lymphocytes Relative: 8 %
Lymphs Abs: 2 10*3/uL (ref 0.7–4.0)
MCH: 28.1 pg (ref 26.0–34.0)
MCHC: 29.4 g/dL — ABNORMAL LOW (ref 30.0–36.0)
MCV: 95.8 fL (ref 80.0–100.0)
Monocytes Absolute: 1.3 10*3/uL — ABNORMAL HIGH (ref 0.1–1.0)
Monocytes Relative: 6 %
Neutro Abs: 20 10*3/uL — ABNORMAL HIGH (ref 1.7–7.7)
Neutrophils Relative %: 83 %
Platelets: 355 10*3/uL (ref 150–400)
RBC: 3.34 MIL/uL — ABNORMAL LOW (ref 3.87–5.11)
RDW: 22.8 % — ABNORMAL HIGH (ref 11.5–15.5)
Smear Review: ADEQUATE
WBC: 24.1 10*3/uL — ABNORMAL HIGH (ref 4.0–10.5)
nRBC: 1.6 % — ABNORMAL HIGH (ref 0.0–0.2)

## 2021-09-17 LAB — PROTIME-INR
INR: 1.8 — ABNORMAL HIGH (ref 0.8–1.2)
Prothrombin Time: 20.5 seconds — ABNORMAL HIGH (ref 11.4–15.2)

## 2021-09-17 LAB — MAGNESIUM: Magnesium: 2.2 mg/dL (ref 1.7–2.4)

## 2021-09-17 LAB — BRAIN NATRIURETIC PEPTIDE: B Natriuretic Peptide: 279.4 pg/mL — ABNORMAL HIGH (ref 0.0–100.0)

## 2021-09-17 LAB — LACTATE DEHYDROGENASE: LDH: 621 U/L — ABNORMAL HIGH (ref 98–192)

## 2021-09-17 MED ORDER — MIDODRINE HCL 5 MG PO TABS
5.0000 mg | ORAL_TABLET | Freq: Three times a day (TID) | ORAL | Status: DC
Start: 2021-09-17 — End: 2021-09-20
  Administered 2021-09-17 – 2021-09-20 (×10): 5 mg via ORAL
  Filled 2021-09-17 (×10): qty 1

## 2021-09-17 MED ORDER — WARFARIN SODIUM 4 MG PO TABS
4.0000 mg | ORAL_TABLET | Freq: Once | ORAL | Status: AC
  Administered 2021-09-17: 4 mg via ORAL
  Filled 2021-09-17: qty 1

## 2021-09-17 MED ORDER — POTASSIUM CHLORIDE 10 MEQ/50ML IV SOLN
10.0000 meq | INTRAVENOUS | Status: AC
  Administered 2021-09-17 (×4): 10 meq via INTRAVENOUS
  Filled 2021-09-17 (×4): qty 50

## 2021-09-17 MED ORDER — POTASSIUM CHLORIDE 20 MEQ PO PACK
40.0000 meq | PACK | Freq: Once | ORAL | Status: AC
  Administered 2021-09-17: 40 meq
  Filled 2021-09-17: qty 2

## 2021-09-17 MED ORDER — ACETAZOLAMIDE 250 MG PO TABS
250.0000 mg | ORAL_TABLET | Freq: Two times a day (BID) | ORAL | Status: DC
  Administered 2021-09-17 – 2021-09-18 (×4): 250 mg via ORAL
  Filled 2021-09-17 (×4): qty 1

## 2021-09-17 MED ORDER — WARFARIN SODIUM 2.5 MG PO TABS: 3.5000 mg | ORAL_TABLET | Freq: Once | ORAL | Status: DC

## 2021-09-17 NOTE — Progress Notes (Signed)
MAP by doppler done with another nurse   which is  MAP of 60. Husband managed to  change from battery to wall with good technique. ?

## 2021-09-17 NOTE — Progress Notes (Addendum)
Patient ID: [name], female   DOB: [birth year], 51 y.o.   MRN: [id number] ?HeartMate 2 Rounding Note ? ?Subjective:   ? ?- 3/14: HM3 LVAD placement with removal of Impella 5.5  ?- 3/15: Speed increased to 5300 rpm. Given 1UPRBC. Febrile. Cultures obtained. Extubated.  ?- 3/16 VT paced out. On amio drip. Given 1UPRBCs . Given couple doses of IV lasix.  ?- 3/18 Rapid ALF. Bolused multiple times with amio and drip turned up to 60. Now back in NSR ?- 3/19 VAD Speed increased to 5400 (high PI)  ?- 3/20 Recurrent ALF. Bolused w/ amio. Milrinone discontinued ?- 3/22 Ramp echo, Fixed Speed 5600  ? ?Co-ox this am inaccurate, 95% ? ?Remains on amiodarone gtt at 60/hr. Developed WCT around 4:45 this am (about 30 min in duration), looks like AF with RVR. Back in SR. ?  ?Nearly net even yesterday with po torsemide and diamox. CVP 12-13 sitting up in chair. Now below pre-op weight. ? ?MAPs 80s this am ? ?Scr stable at 1.04. K 3.3. Mag 2.2.  ? ?Getting TFs via [employer]. ? ?LVAD INTERROGATION:  ?HeartMate II LVAD:  Flow 4.9, speed 5600, power 4.2, PI 2.8. Numerous PI events. VAD interrogated personally. Parameters stable. ? ? ?Objective:   ? ?Vital Signs:   ?Temp:  [97.6 ?F (36.4 ?C)-98.7 ?F (37.1 ?C)] 98.6 ?F (37 ?C) (03/27 2300) ?Pulse Rate:  [78-84] 84 (03/27 1600) ?Resp:  [15-29] 21 (03/28 0500) ?Arterial Line BP: (63-123)/(55-109) 104/91 (03/28 0500) ?Weight:  [99.7 kg-100.2 kg] 99.7 kg (03/28 0500) ?Last BM Date : 09/16/21 ?Mean arterial Pressure  80s ? ?Intake/Output:  ? ?Intake/Output Summary (Last 24 hours) at 09/17/2021 0657 ?Last data filed at 09/17/2021 0500 ?Gross per 24 hour  ?Intake 2497.28 ml  ?Output 1945 ml  ?Net 552.28 ml  ?  ?Physical Exam: ?GENERAL: No distress. Sitting up in bed. ?HEENT: normal  ?NECK: Supple, JVP 12 cm.  2+ bilaterally, no bruits.   ?CARDIAC:  Mechanical heart sounds with LVAD hum present.  ?LUNGS:  Clear to auscultation bilaterally.  ?ABDOMEN:  Soft, round, nontender, positive bowel sounds  x4.     ?LVAD exit site: well-healed and incorporated.  Dressing dry and intact.  No erythema or drainage.  Stabilization device present and accurately applied.  Driveline dressing is being changed daily per sterile technique. ?EXTREMITIES:  Warm and dry, no cyanosis, clubbing, rash, + pedal edema ?NEUROLOGIC:  Alert and oriented x 4.  Gait steady.  No aphasia.  No dysarthria.  Affect pleasant.    ? ? ? ? ?Telemetry: SR 80s-90s, 30 minutes of AF with RVR around 4:45 am ? ?Labs: ?Basic Metabolic Panel: ?Recent Labs  ?Lab 09/13/21 ?1436 09/14/21 ?0204 09/14/21 ?0431 09/14/21 ?7371 09/14/21 ?1629 09/15/21 ?0320 09/15/21 ?1429 09/16/21 ?0500 09/16/21 ?1430 09/17/21 ?0140  ?NA 137   < > 137   < > 136 137 135 137 139 138  ?K 3.7  3.8   < > 4.0   < > 3.4* 2.6* 3.2* 2.2* 3.8 3.3*  ?CL 91*   < > 91*   < > 88* 85* 86* 84* 91* 92*  ?CO2 35*   < > 38*   < > 38* 41* 39* 42* 39* 38*  ?GLUCOSE 110*   < > 119*   < > 141* 122* 129* 113* 122* 103*  ?BUN 38*   < > 39*   < > 43* 44* 51* 55* 56* 58*  ?CREATININE 1.33*   < > 1.28*   < >  1.23* 1.22* 1.18* 1.16* 1.22* 1.04*  ?CALCIUM 9.3   < > 9.2   < > 9.1 9.1 9.1 8.9 9.2 9.0  ?MG 2.0  --  1.9  --  2.6* 2.2  --  2.0  --  2.2  ?PHOS 3.5  --  2.9  --  3.1  --   --   --   --   --   ? < > = values in this interval not displayed.  ? ? ?Liver Function Tests: ?No results for input(s): AST, ALT, ALKPHOS, BILITOT, PROT, ALBUMIN in the last 168 hours. ? ?No results for input(s): LIPASE, AMYLASE in the last 168 hours. ?No results for input(s): AMMONIA in the last 168 hours. ? ?CBC: ?Recent Labs  ?Lab 09/13/21 ?0424 09/14/21 ?0431 09/15/21 ?0320 09/16/21 ?0500 09/17/21 ?0140  ?WBC 25.8* 27.8* 30.1* 27.1* 24.1*  ?NEUTROABS 21.4* 23.8* 26.0* 23.2* 20.0*  ?HGB 9.1* 8.9* 9.0* 8.9* 9.4*  ?HCT 30.0* 29.3* 29.5* 29.0* 32.0*  ?MCV 92.3 93.0 93.1 93.2 95.8  ?PLT 429* 451* 413* 395 355  ? ? ?INR: ?Recent Labs  ?Lab 09/13/21 ?0424 09/14/21 ?0431 09/15/21 ?0320 09/16/21 ?0500 09/17/21 ?0140  ?INR 2.3* 2.8* 2.3*  2.0* 1.8*  ? ? ?Other results: ? ? ?Imaging: ?No results found. ? ? ?Medications:   ? ? ?Scheduled Medications: ? aspirin EC  81 mg Oral Daily  ? Chlorhexidine Gluconate Cloth  6 each Topical Daily  ? digoxin  0.0625 mg Oral Daily  ? feeding supplement (NEPRO CARB STEADY)  237 mL Per Tube TID BM  ? gabapentin  100 mg Oral TID  ? insulin aspart  0-24 Units Subcutaneous TID WC & HS  ? methimazole  10 mg Oral Daily  ? methimazole  5 mg Oral QPM  ? midodrine  10 mg Oral TID WC  ? mirtazapine  7.5 mg Oral QHS  ? pantoprazole  40 mg Oral Daily  ? potassium chloride  40 mEq Per Tube Once  ? potassium chloride  40 mEq Per Tube Once  ? predniSONE  20 mg Oral Q breakfast  ? quiniDINE gluconate  324 mg Oral BID  ? rosuvastatin  40 mg Oral QHS  ? sildenafil  20 mg Oral TID  ? sodium chloride flush  10-40 mL Intracatheter Q12H  ? torsemide  40 mg Oral Daily  ? Warfarin - Pharmacist Dosing Inpatient   Does not apply [id number]  ? ? ?Infusions: ? sodium chloride    ? sodium chloride Stopped (09/09/21 0758)  ? sodium chloride    ? amiodarone 60 mg/hr (09/17/21 0500)  ? feeding supplement (PIVOT 1.5 CAL) 1,000 mL (09/16/21 1638)  ? lactated ringers    ? lactated ringers Stopped (09/05/21 1010)  ? ? ?PRN Medications: ?Place/Maintain arterial line **AND** sodium chloride, ALPRAZolam, loperamide HCl, ondansetron (ZOFRAN) IV, oxyCODONE, senna-docusate, sodium chloride, sodium chloride flush, sodium chloride flush, sodium chloride flush, traMADol ? ? ? ?Plan/Discussion:   ? ? 1. Acute on chronic systolic HF: Nonischemic cardiomyopathy.  She has a Medtronic ICD. Cause for cardiomyopathy is uncertain, initially identified peri-partum (after son's birth), so cannot rule out peri-partum CMP.  On and off, she has had frequent PVCs.   RHC in 8/15 showed normal filling pressures and preserved cardiac output. Echo in 4/22 with EF 25%, severe LV dilation.  CPX (8/22) with mild-moderate HF limitation.  Echo this admission with EF < 20% with severe  dilation, normal RV size with mildly decreased systolic function, moderate-severe functional MR with dilated  IVC. She was admitted with volume overload, NYHA class IV symptoms and low output. HCO3 18 and creatinine 1.5 on admit, co-ox 47%. Milrinone 0.25 started + lasix gtt.  I also suspect hyperthyroidism is playing a role in her CHF exacerbation. 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 Impella 5.5 placed 3/9.  Patient had HM3 LVAD placed 3/15. 3/15 Speed increased to 5300 rpm. 3/19 Speed increased to 5400. Ramp echo 3/22, fixed speed increased to 5600.  She is off pressors and milrinone.   ?- Am co-ox inaccurate. Recheck. 1.4L UOP charted yesterday. CVP 12-13. On po Torsemide 40 mg daily + po diamox 250 BID. Weight down 1 lb overnight but down 30 lb from post-op. ?- Notes a lot of pedal edema despite compression stockings. UNNA boots. ?- Off spiro with soft BP ?- Continue Digoxin 0.0625 mg  ?- Continue asa 81 mg daily. Will need long-term with h/o CVA ?- Continue sildenafil 20 mg tid  ?- Warfarin for VAD. INR 1.8. Dosing per pharmD. Discussed personally. ?- LDH 548>530>534>543>621 => trended up today, will need to watch.  ?- Can decrease midodrine to 5 mg TID with MAPs in 80s ?2. Acute hypoxemic respiratory failure: Intubated post-op. Extubated, now on 3 liters. Continue aggressive pulmonary toilet.  ?- Ambulate.   ?3. Morbid obesity: BMI out of range for heart transplant.  ?4. 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  She has been on Levoxyl at home but does not think she has taken for a month.  Now hyperthyroid.  This likely contributes to her CHF exacerbation. [facility] evaluated her for this. 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  ?- Solumedrol 20 mg IV daily -> transitioned to prednisone 20 on 3/16 and methimazole bid.  ?- per IM,  continue prednisone 20 mg x 1 month, then reduce down to 10 mg until endo follow-up  ?- Will need outpatient endocrinology followup.  ?- Continuing amiodarone for now with NSVT and rapid afib. See below. ?5. PVCs/

## 2021-09-17 NOTE — Progress Notes (Signed)
LVAD Coordinator Rounding Note: ? ?Admitted 08/26/21 due to acute on chronic systolic heart failure. Receiving ongoing treatment for hyperthyroidism with Methimazole and steroids. On 08/29/21 Impella 5.5 placed.  ? ?HM III LVAD implanted on 09/03/21 by Dr Prescott Gum under destination therapy criteria due to elevated BMI.  ? ?Pt sitting up in the chair on my arrival. Unable to obtain acurrate automatic cuff pressures or doppler pressures. Pt will need to keep the arterial line for now. ? ?WBC 24.1 today; afebrile. Completed Cefepime for positive urine culture. She remains on methimazole and PO Prednisone for hyperthyroidism. Will need endocrinology f/u appt at discharge.  ? ?Pt states that she is having trouble using the battery holster when she is walking so she has been putting her controller and batteries in a pillow case and putting it on her arm. I will bring up a consolidated bag today for her to try. ? ?Pts home equipment ordered yesterday.  ? ?Education completed yesterday with pt and her husband [name]. They completed the test last night. Today will we plan to grade the test and VAD coordinator will observe [name] doing the driveline dressing change. ? ?RAMP echo tomorrow at bedside at 1p per Dr [name] ? ?Husband would like to go to support group today. VAD coordinator will come up and get [name] this afternoon to walk him down for support group. ? ?Vital signs: ?Temp: 98.5 ?HR: 79 NSR ?Doppler Pressure: not accurate ?Arterial BP: 97/86 (93) ?O2 Sat: UTO Pt on RA  ?Wt: 243.2>244.7>248>242.6>242.5>241.2>238.1>219.8  lbs ? ?LVAD interrogation:  ?Speed: 5600 ?Flow: 5.0 ?Power: 4.3 w ?PI: 3.9 ? ?Alarms: none ?Events: 100+ today ? ?Hematocrit: 32 ?Fixed speed: 5600 ?Low speed limit: 5300 ? ?Drive Line: Existing VAD dressing removed and site care performed using sterile technique by pts husband Jamie-observed by VAD coordinator. Drive line exit site cleaned with Chlora prep applicators x 2, RINSED WITH SALINE, allowed to  dry, and gauze dressing with Silver strip applied. Exit site healing and unincorporated, the velour is fully implanted at exit site. 2 sutures intact. Large amount of serosanguinous drainage. No redness, tenderness, foul odor or rash noted. Drive line anchor secure. Pt will need daily dressing changes using daily kit per VAD coordinator or nurse champion. Next dressing change due: 09/18/21.  ? ? ? ? ? ?Labs:  ?LDH trend: 391>485>519>534>530>543>621 ? ?INR trend: 0.8>1.6>2.9>2.2>2.3>2.0>1.8 ? ?WBC trend: 25.1>29.5>30.7>32.4>25.8>27.1>24.1 ? ?Hgb trend: 6.5>7.5>8.0>9.6>9.5>9.1>8.9>9.4 ? ?Anticoagulation Plan: ?-INR Goal: 2.0 - 2.5 ?-ASA Dose: 81 mg  ? ?Blood Products:  ?IntraOp: ? 4 PRBCs ? 4 FFP ? 2 Cryo ? 2 platelets ?DDAVP ? 1,400 cell saver ?Post Op: ?09/03/21: Factor 7 ? 1 PRBC ? 1 Platelet ?09/04/21: 1 PRBC ?09/05/21: 1 PRBC ? ?Device: ?-Medtronic  ?-Therapies: OFF ? 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 ?Respiratory: extubated 09/04/21  ? ?Infection: ?08/31/21>> blood cultures>> no growth 5 days; final ?09/04/21>> blood cultures>> no growth 5 days; final ?09/04/21>> sputum culture>> moderate WBC, no organisms seen; rare candida albicans ?09/04/21>> urine culture>> enterococcus faecalis; final ? ?Drips: ?Amiodarone 60 mg/hr ?Lasix 12 mg/hr-off ? ?Patient Education: ?Husband performed dressing change today. VAD coordinator provided cues throughout procedure. Husband would like to perform again tomorrow at 0930. ?All education completed with exception of completing test and grading, we will work to complete test and grading this afternoon. ? ?Plan/Recommendations:  ?1. Page VAD coordinators with VAD equipment and drive line concerns. ?2. Daily drive line dressing change per VAD coordinator or nurse champion using daily  kit. ? ?Tanda Rockers RN ?VAD Coordinator  ?Office: [phone]  ?24/7 Pager: [phone]  ? ? ?  ?

## 2021-09-17 NOTE — Progress Notes (Signed)
Orthopedic Tech Progress Note ?Patient Details:  ?[name] ?[birth year] ?[id number] ? ?Ortho Devices ?Type of Ortho Device: Unna boot ?Ortho Device/Splint Location: BLE ?Ortho Device/Splint Interventions: Application, Ordered, Adjustment ?  ?Post Interventions ?Patient Tolerated: Well ?Instructions Provided: Care of device ? ?Janit Pagan ?09/17/2021, 2:23 PM ? ?

## 2021-09-17 NOTE — Progress Notes (Signed)
OT Cancellation Note ? ?Patient Details ?Name: [name] ?MRN: [id number] ?DOB: January 21, 1971 ? ? ?Cancelled Treatment:    Reason Eval/Treat Not Completed: Patient at procedure or test/ unavailable (doing drive line education with husband and VAD team) OT will continue to follow as schedule allows ? ?[name] ?09/17/2021, 10:09 AM ? ?Jesse Sans OTR/L ?Acute Rehabilitation Services ?Pager: [phone] ?Office: [phone] ? ?

## 2021-09-17 NOTE — Progress Notes (Signed)
ANTICOAGULATION CONSULT NOTE - Initial Consult ? ?Pharmacy Consult for warfarin ?Indication:  LVAD HM3 ? ?No Known Allergies ? ?Patient Measurements: ?Height: '5\' 3"'$  (160 cm) ?Weight: 99.7 kg (219 lb 12.8 oz) ?IBW/kg (Calculated) : 52.4 ? ?Vital Signs: ?Temp: 98 ?F (36.7 ?C) (03/28 1200) ?Temp Source: Axillary (03/28 1200) ?Pulse Rate: 82 (03/28 1200) ? ?Labs: ?Recent Labs  ?  09/15/21 ?0320 09/15/21 ?1429 09/16/21 ?0500 09/16/21 ?1430 09/17/21 ?0140  ?HGB 9.0*  --  8.9*  --  9.4*  ?HCT 29.5*  --  29.0*  --  32.0*  ?PLT 413*  --  395  --  355  ?LABPROT 25.7*  --  22.6*  --  20.5*  ?INR 2.3*  --  2.0*  --  1.8*  ?CREATININE 1.22*   < > 1.16* 1.22* 1.04*  ? < > = values in this interval not displayed.  ? ? ? ?Estimated Creatinine Clearance: 72.8 mL/min (A) (by C-G formula based on SCr of 1.04 mg/dL (H)). ? ? ?Medical History: ?Past Medical History:  ?Diagnosis Date  ? Automatic implantable cardioverter-defibrillator in situ   ? Chronic CHF (congestive heart failure) (HCC)   ? a. EF 15-20% b. RHC (09/2013) RA 14, RV 57/22, PA 64/36 (48), PCWP 18, FIck CO/CI 3.7/1.6, PVR 8.1 WU, PA sat 47%   ? History of stomach ulcers   ? Hypertension   ? Hypotension   ? Hypothyroidism   ? Morbid obesity ([place])   ? Myocardial infarction [facility]) 08/2013  ? Nocturnal dyspnea   ? Nonischemic cardiomyopathy ([place])   ? Sinus tachycardia   ? Sleep apnea   ? Snoring-prob OSA 09/04/2011  ? [employer] ICD lead RECALL  [phone]   ? UARS (upper airway resistance syndrome) 09/04/2011  ? HST 12/2013:  AHI 4/hr (numerous episodes of airflow reduction that did not have concomitant desaturation)   ? ? ?Medications:  ?Scheduled:  ? acetaZOLAMIDE  250 mg Oral BID  ? aspirin EC  81 mg Oral Daily  ? Chlorhexidine Gluconate Cloth  6 each Topical Daily  ? digoxin  0.0625 mg Oral Daily  ? feeding supplement (NEPRO CARB STEADY)  237 mL Per Tube TID BM  ? gabapentin  100 mg Oral TID  ? insulin aspart  0-24 Units Subcutaneous TID WC & HS  ? methimazole  10 mg Oral Daily   ? methimazole  5 mg Oral QPM  ? midodrine  5 mg Oral TID WC  ? mirtazapine  7.5 mg Oral QHS  ? pantoprazole  40 mg Oral Daily  ? predniSONE  20 mg Oral Q breakfast  ? quiniDINE gluconate  324 mg Oral BID  ? rosuvastatin  40 mg Oral QHS  ? sildenafil  20 mg Oral TID  ? sodium chloride flush  10-40 mL Intracatheter Q12H  ? torsemide  40 mg Oral Daily  ? warfarin  3.5 mg Oral ONCE-1600  ? Warfarin - Pharmacist Dosing Inpatient   Does not apply F7510  ? ? ?Assessment: ?51 yo F who is s/p HM3 LVAD placement on 3/14. Patient has been started on warfarin and baby aspirin. Baby aspirin will be continued indefinitely given prior CVA. Average warfarin dose over the past 7 days is ~2.6 mg. INR has been in range, however has now dropped and is subtherapeutic at 1.8 today. Patient has ben eating more since CoreTrack placed on Friday. LDH had been stable in the 540s, but increased today to 621. Will give slightly higher dose tonight to hopefully get INR  back in range.  ? ?Goal of Therapy:  ?INR 2-2.5 ?Monitor platelets by anticoagulation protocol: Yes ?  ?Plan:  ?Warfarin 4 mg tonight ?Daily INR and CBC ? ?[name], PharmD ?PGY2 Cardiology Pharmacy Resident ?Phone: [phone] ?09/17/2021  2:24 PM ? ?Please check [email or web address] for unit-specific pharmacy phone numbers. ? ?[name] ?09/17/2021,2:24 PM ? ? ?

## 2021-09-17 NOTE — Progress Notes (Signed)
Occupational Therapy Treatment ?Patient Details ?Name: [name] ?MRN: [id number] ?DOB: Jul 23, 1970 ?Today's Date: 09/17/2021 ? ? ?History of present illness 51yo female adm 3/6 with acute on chronic heart failure exacerbation. Impella placed  3/9. LVAD HM 3 implanted 09/03/21. 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 ?  ?OT comments ? Pt making excellent progress towards OT goals. Pt demonstrating improved bed mobility at min guard A for line management, UB ADL at mod A, grooming with set up, peri care in standing with min guard, and hallway ambulation with RW (has previously only used EVA walker). POC remains appropriate and continue to recommend comprehensive inpatient rehab to maximize safety and independence in ADL and functional transfers, OT to focus on sternal precautions with ADL and compensatory strategies as Pt continues to require mod cues during functional ADL. Of Note: Pt able edema in Bil hands improved so that she was able to perform her own power changeover for the first time during our session.   ? ?Recommendations for follow up therapy are one component of a multi-disciplinary discharge planning process, led by the attending physician.  Recommendations may be updated based on patient status, additional functional criteria and insurance authorization. ?   ?Follow Up Recommendations ? Acute inpatient rehab (3hours/day)  ?  ?Assistance Recommended at Discharge Frequent or constant Supervision/Assistance  ?Patient can return home with the following ? A lot of help with bathing/dressing/bathroom;A little help with walking and/or transfers ?  ?Equipment Recommendations ? Other (comment) (TBD)  ?  ?Recommendations for Other Services   ? ?  ?Precautions / Restrictions Precautions ?Precautions: Fall;Sternal ?Precaution Booklet Issued: No ?Precaution Comments: LVAD, sternal, NG tube ?Restrictions ?Weight Bearing Restrictions: Yes ?Other Position/Activity Restrictions: sternal  precautions  ? ? ?  ? ?Mobility Bed Mobility ?Overal bed mobility: Needs Assistance ?Bed Mobility: Supine to Sit ?  ?  ?Supine to sit: Min guard ?  ?  ?General bed mobility comments: Pt able to come EOB without assist ?  ? ?Transfers ?Overall transfer level: Needs assistance ?Equipment used: Rolling walker (2 wheels) ?Transfers: Sit to/from Stand ?Sit to Stand: Min assist, +2 safety/equipment ?  ?  ?  ?  ?  ?General transfer comment: Min A initially for slight boost to rise from EOB x1 ?  ?  ?Balance Overall balance assessment: Needs assistance ?Sitting-balance support: Feet supported, No upper extremity supported ?Sitting balance-Leahy Scale: Fair ?Sitting balance - Comments: improving from previous sessions ?  ?Standing balance support: During functional activity, Reliant on assistive device for balance ?Standing balance-Leahy Scale: Poor ?Standing balance comment: Reliant on UE support ?  ?  ?  ?  ?  ?  ?  ?  ?  ?  ?  ?   ? ?ADL either performed or assessed with clinical judgement  ? ?ADL Overall ADL's : Needs assistance/impaired ?  ?  ?Grooming: Sitting;Wash/dry face;Set up ?  ?  ?  ?Lower Body Bathing: Maximal assistance;Sit to/from stand ?  ?Upper Body Dressing : Moderate assistance;Sitting ?Upper Body Dressing Details (indicate cue type and reason): d/t lines ?Lower Body Dressing: Maximal assistance;Sit to/from stand ?Lower Body Dressing Details (indicate cue type and reason): donning socks ?  ?  ?Toileting- Water quality scientist and Hygiene: Min guard;Sit to/from stand ?Toileting - Clothing Manipulation Details (indicate cue type and reason): able to perform peri care in standing after purewick removal ?  ?  ?Functional mobility during ADLs: Minimal assistance;+2 for safety/equipment (min A for initial boost only) ?General  ADL Comments: cues for sternal precautions during ADL, imoroved hand function today ?  ? ?Extremity/Trunk Assessment Upper Extremity Assessment ?Upper Extremity Assessment: Generalized  weakness ?RUE Deficits / Details: Improving edema ?RUE Sensation: WNL ?RUE Coordination: decreased gross motor;decreased fine motor ?LUE Deficits / Details: improving edema ?LUE Sensation: WNL ?LUE Coordination: decreased fine motor;decreased gross motor ?  ?Lower Extremity Assessment ?Lower Extremity Assessment: Defer to PT evaluation ?  ?  ?  ? ?Vision   ?Vision Assessment?: No apparent visual deficits ?  ?Perception   ?  ?Praxis   ?  ? ?Cognition Arousal/Alertness: Awake/alert ?Behavior During Therapy: [facility] for tasks assessed/performed, Anxious ?Overall Cognitive Status: Within Functional Limits for tasks assessed ?  ?  ?  ?  ?  ?  ?  ?  ?  ?  ?  ?  ?  ?  ?  ?  ?General Comments: continued cues for sternal precautions; anxiety better today ?  ?  ?   ?Exercises   ? ?  ?Shoulder Instructions   ? ? ?  ?General Comments Spouse presenrt during session. VSS.  ? ? ?Pertinent Vitals/ Pain       Pain Assessment ?Pain Assessment: Faces ?Faces Pain Scale: Hurts a little bit ?Pain Location: chest at incision ?Pain Descriptors / Indicators: Guarding, Sore, Grimacing, Operative site guarding ?Pain Intervention(s): Monitored during session, Repositioned ? ?Home Living   ?  ?  ?  ?  ?  ?  ?  ?  ?  ?  ?  ?  ?  ?  ?  ?  ?  ?  ? ?  ?Prior Functioning/Environment    ?  ?  ?  ?   ? ?Frequency ? Min 2X/week  ? ? ? ? ?  ?Progress Toward Goals ? ?OT Goals(current goals can now be found in the care plan section) ? Progress towards OT goals: Progressing toward goals ? ?Acute Rehab OT Goals ?Patient Stated Goal: get better and get to the beach ?OT Goal Formulation: With patient/family ?Time For Goal Achievement: 09/27/21 ?Potential to Achieve Goals: Good ?ADL Goals ?Pt Will Perform Grooming: with supervision;sitting ?Pt Will Perform Upper Body Bathing: with min assist;sitting ?Pt Will Perform Lower Body Dressing: with min assist;with adaptive equipment;sitting/lateral leans;sit to/from stand ?Pt Will Transfer to Toilet: with min guard  assist;ambulating;regular height toilet ?Pt/caregiver will Perform Home Exercise Program: Increased strength;Increased ROM;Right Upper extremity;With written HEP provided  ?Plan Discharge plan remains appropriate   ? ?Co-evaluation ? ? ?   ?  ?  ?  ?  ? ?  ?AM-PAC OT "6 Clicks" Daily Activity     ?Outcome Measure ? ? Help from another person eating meals?: None ?Help from another person taking care of personal grooming?: A Little ?Help from another person toileting, which includes using toliet, bedpan, or urinal?: A Lot ?Help from another person bathing (including washing, rinsing, drying)?: A Lot ?Help from another person to put on and taking off regular upper body clothing?: A Lot ?Help from another person to put on and taking off regular lower body clothing?: A Lot ?6 Click Score: 15 ? ?  ?End of Session Equipment Utilized During Treatment: Rolling walker (2 wheels);Oxygen (2L during mobility) ? ?OT Visit Diagnosis: Other abnormalities of gait and mobility (R26.89);Muscle weakness (generalized) (M62.81) ?Pain - part of body:  (sternal incision) ?  ?Activity Tolerance Patient tolerated treatment well ?  ?Patient Left in chair;with call [place]/phone within reach;with family/visitor present ?  ?Nurse Communication Mobility status;Precautions;Other (comment) (  bloody discharge from vagina) ?  ? ?   ? ?Time: 0211-1735 ?OT Time Calculation (min): 42 min ? ?Charges: OT General Charges ?$OT Visit: 1 Visit ?OT Treatments ?$Self Care/Home Management : 8-22 mins ? ?Jesse Sans OTR/L ?Acute Rehabilitation Services ?Pager: [phone] ?Office: [phone] ? ?Merri Ray Heyden Jaber ?09/17/2021, 1:10 PM ?

## 2021-09-17 NOTE — Progress Notes (Signed)
? ? ?  Progress Note from the Palliative Medicine Team at [facility] ? ?Patient ID: [name], female   DOB: 1971/01/29, 51 y.o.   MRN: [id number] ? ? ?Patient Name: [name]        ?Date: 09/17/2021 ?DOB: [birth year]  Age: 51 y.o. MRN#: [id number] ?Attending Physician: Sanda Klein, MD ?Primary Care Physician: Patient, No Pcp Per (Inactive) ?Admit Date: 08/25/2021 ? ? ?Medical records reviewed  ? ?51 y.o. female   admitted on 08/25/2021 with  ?[name] is a 51 y.o. 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 ?  ? S/p LVAD implantation 09-03-21, progressing well.  ?Transferred to progressive unit today  ? ?This NP visited patient at the bedside as a follow up for palliative medicine needs and emotional support.   Husband  at bedside. ? ?Patient is alert and oriented,  sitting up in bed reading.  She tells me she "feels good today".   ? ? ?Education offered on the next steps in transition of care at it relates to treatment plan, physical therapy and anticipatory care needs.  ? ?Husband verbalizes his increasing confidence in his caregiver role.  ? ?Deshanae is c/o BLE edema. It is improving slowly.  Education offered on importance of ankle pumps and isometric exercises and certainly continued work with therapies. ? ?Emotional support offered ? ?Questions and concerns addressed    ? ?Total time spend on the unit was 35 minutes.  Greater than 50 % time spent in counseling and coordination of care.  ? ? ?Wadie Lessen NP  ?Palliative Medicine Team Team Phone # [phone] ?Pager [phone] ?  ?

## 2021-09-17 NOTE — Progress Notes (Signed)
This chaplain is present for F/U spiritual care. The Pt. is sitting up in the bedside recliner after a lap around the unit. The Pt. recognizes the increased work in her breathing with the chaplain. The Pt. husband-Jamie is visiting.  ? ?The Pt. welcomes the chaplain's ongoing presence and prayer. The chaplain understands the Pt. goal is three laps around the unit, using her "positive self talk" to keep moving, alongside her faith. ? ?The Pt. and [name] accept the chaplain's invitation for prayer before [name] leaves for his first support group meeting. ? ?Chaplain Sallyanne Kuster ?[phone] ?

## 2021-09-17 NOTE — Progress Notes (Signed)
VAD Discharge Teaching Note: ? ?Discharge VAD teaching completed with [name] and her husband [name]. ? ?The home inspection checklist has been reviewed and no unsafe conditions have been identified. Family reports that there are at least two dedicated grounded, 3-prong outlets with clearly labeled circuit breaker has been established in the bedroom for power module and Charity fundraiser.  ? ?Both patient and caregiver have been trained on the following: ? ?1. HM III LVAD overview of system operations  ?2. Overview of major lifestyle accommodations and cautions   ?3. Overview of system components (features and functions) ?4. Changing power sources ?5. Overview of alerts and alarms ?6. How to identify and manage an emergency including when pump is running and when pump has stopped  ?7. Changing system controller ?8. Maintain emergency contact list and medications ? ?The patient and caregiver have successfully demonstrated: ? ?1. Changing power source (from batteries to mobile power unit, mobile power unit to batteries, and replacing batteries) ?2. Perform system controller self test  ?3. Check and charge batteries  ?4. Change system controller ?5. Paged VAD pager and programmed number in phones ? ?A daily flow sheet with patient  weight, temperature,  flow, speed, power, and PI, along with daily self checks on system controller and power module have been performed by patient and caregiver during hospitalization and will also be done daily at home.  ? ?The caregiver has been trained on percutaneous lead exit site care, care of the driveline and dressing changes. She/he has performed dressing changes during patient's hospitalization under my supervision with the support of the nursing staff. The importance of lead immobilization has been stressed to patient and caregiver using the attachment device. The caregiver has successfully demonstrated the following: ?1. Cleansing site with sterile technique ?2. Dressing  care and maintenance  ?3. Immobilizing driveline ? ?The following routine activities and maintenance have been reviewed with patient and caregiver and both verbalize understanding: ? ?1. Stressed importance of never disconnecting power from both controller power leads at the same time, and never disconnecting both batteries at the same time, or the pump will alarm and eventually stop if no power supply restored.  ?2. Plug the mobile power unit (MPU) and the universal battery charger (UBC) into properly grounded (3 prong) outlets dedicated to PM use. Do NOT use adapter (cheater plug) for ungrounded outlets or multiple portable socket outlets (power strips) ?3. Do not connect the PM or MPU to an outlet controlled by wall switch or the device may not work ?4. Transfer from MPU to batteries during [facility] mains power failure. The PM has internal backup battery that will power the pump while you transfer to batteries ?5. Keep a backup system controller, charged batteries, battery clips, and flashlight near you during sleep in case of electrical power outage ?6. Clean battery, battery clip, and universal Charity fundraiser contacts weekly ?7. Visually inspect percutaneous lead daily ?8. Check cables and connectors when changing power source  ?9. Rotate batteries; keep all eight batteries charged ?10. Always have backup system controller, battery clips, fully charged batteries, and spare fully charged batteries when traveling ?11. Re-calibrate batteries every 70 uses; monitor battery life of 36 months or 360 uses; replace batteries at end of battery life ? ? ?Identified the following changes in activities of daily living with pump: ? ?1. No driving for at least six weeks and then only if doctor gives permission to do so ?2. No tub baths while pump implanted, and shower only if  doctor gives permission ?3. No swimming or submersion in water while implanted with pump ?4. Keep all VAD equipment away from water or moisture ?5. Keep all  VAD connections clean and dry ?6. No contact sports or engage in jumping activities ?7. Never have an MRI while implanted with the pump ?8. Never leave or store batteries in extremely hot or cold places (such as   trunk of your car), or the battery life will be shortened ?9. Call the doctor or hospital contact person if any change in how the pump sounds, feels, or works ?10. Plan to sleep only when connected to the mobile power unit. . ?11. Keep a backup system controller, charged batteries, battery clips, and flashlight near you during sleep in case of electrical power outage ?12. Do not sleep on your stomach ?13. Talk with doctor before any long distance travel plans ?14. Patient will need antibiotics prior to any dental procedure; instructed to contact VAD coordinator before any dental procedures (including routine cleaning) ? ? ?Discharge binder given to patient and include the following: ?1. List of emergency contacts ?2. Wallet card ?3. HM III Luggage tags ?4. HM III Alarms for Patients and their Caregivers ?5. HM III Patient Handbook ?6. HM III Patient Education Program DVD ?7. Daily diary sheets ?8. Warfarin teaching sheets ?9. Nosebleed teaching sheets ?10. Medications you may and may not take with CHF list ? ?Discharge equipment has been ordered. ? ? ?Following d/c we will notify: ?[facility] ?[place]  ?   ? ?Discussed frequency and importance of INR checks; emphasized importance of maintaining INR goal to prevent clotting and or bleeding issues with pump.  Patient able to answer questions and asked good questions pertaining to warfarin and diet/lifestyle changes necessary to be successful and safe.  Patient will have INR managed by V[facility] Clinic; current INR goal is 2.0 - 2.5.   ? ?The patient has completed a proficiency test for the HM III and all questions have been answered. The pt and family have been instructed to call if any questions, problems, or concerns arise. Pt and  caregiver successfully paged VAD coordinator using VAD pager emergency number and have been instructed to use this number only for emergencies. ?Patient and caregiver asked appropriate questions, had good interaction with VAD coordinator, and verbalized understanding of above instructions.  ? ?Before d/c: ?Pt and husband will need to practice paging the VAD pager system. [name] will need to perform the dressing changes a few more times. ? ?Tanda Rockers, RN ?VAD Coordinator  ?Office: [phone] ?24/7 VAD Pager: [phone] ? ?

## 2021-09-17 NOTE — Progress Notes (Signed)
HeartMate 3 Rounding Note ? ?Postop day  #114 HeartMate 3 Implantation March 14 ? ?Subjective:   ?Patient maintaining sinus rhythm overnight on IV amiodarone ?Excellent diuresis  -  lasix drip off now on po torsemide ?Feels better this am , ate some breakfast ?Chest x-ray remains clear.  White count remains elevated probably secondary to steroid therapy for hyperthyroidism. ?LVAD parameters and flow remained stable ?Core track feedings at goal due to poor oral intake ?INR  today 1.8 ? ? ?LVAD INTERROGATION:  ?HeartMate 3LVAD:  Flow 5.1liters/min, speed 5600, power 4.8 PI 3.0.  Controller  intact.  ? ?Objective:   ? ?Vital Signs:   ?Temp:  [97.6 ?F (36.4 ?C)-98.7 ?F (37.1 ?C)] 98.5 ?F (36.9 ?C) (03/28 0800) ?Pulse Rate:  [83-84] 83 (03/28 0800) ?Resp:  [15-29] 24 (03/28 0815) ?BP: (42)/(12) 42/12 (03/28 0800) ?Arterial Line BP: (63-123)/(55-109) 97/86 (03/28 0815) ?Weight:  [99.7 kg-100.2 kg] 99.7 kg (03/28 0500) ?Last BM Date : 09/17/21 ?Mean arterial Pressure 65-35m Hg ? ?Intake/Output:  ? ?Intake/Output Summary (Last 24 hours) at 09/17/2021 0943 ?Last data filed at 09/17/2021 0800 ?Gross per 24 hour  ?Intake 2080.46 ml  ?Output 1460 ml  ?Net 620.46 ml  ?  ? ?Physical Exam: ?General:  Well appearing. No resp difficulty, mild tachypnea. Sternal incision dry ?HEENT: normal ?Neck: supple. JVP . 2+ carotids  no bruits. No lymphadenopathy or thryomegaly appreciated. L neck drain for Impella 5.5 out and incision healing ?Cor: Mechanical heart sounds with LVAD hum present. ?Lungs: clear ?Abdomen: soft, nontender, nondistended. No hepatosplenomegaly. No bruits or masses. Good bowel sounds. ?Extremities: no cyanosis, clubbing, rash, edema ?Neuro: alert & orientedx3, cranial nerves grossly intact. moves all 4 extremities w/o difficulty. Affect pleasant ? ?Telemetry: sinus tach ? ?Labs: ?Basic Metabolic Panel: ?Recent Labs  ?Lab 09/13/21 ?1436 09/14/21 ?0204 09/14/21 ?0431 09/14/21 ?0299303/25/23 ?1629 09/15/21 ?0320  09/15/21 ?1429 09/16/21 ?0500 09/16/21 ?1430 09/17/21 ?0140  ?NA 137   < > 137   < > 136 137 135 137 139 138  ?K 3.7  3.8   < > 4.0   < > 3.4* 2.6* 3.2* 2.2* 3.8 3.3*  ?CL 91*   < > 91*   < > 88* 85* 86* 84* 91* 92*  ?CO2 35*   < > 38*   < > 38* 41* 39* 42* 39* 38*  ?GLUCOSE 110*   < > 119*   < > 141* 122* 129* 113* 122* 103*  ?BUN 38*   < > 39*   < > 43* 44* 51* 55* 56* 58*  ?CREATININE 1.33*   < > 1.28*   < > 1.23* 1.22* 1.18* 1.16* 1.22* 1.04*  ?CALCIUM 9.3   < > 9.2   < > 9.1 9.1 9.1 8.9 9.2 9.0  ?MG 2.0  --  1.9  --  2.6* 2.2  --  2.0  --  2.2  ?PHOS 3.5  --  2.9  --  3.1  --   --   --   --   --   ? < > = values in this interval not displayed.  ? ? ?Liver Function Tests: ?No results for input(s): AST, ALT, ALKPHOS, BILITOT, PROT, ALBUMIN in the last 168 hours. ? ?No results for input(s): LIPASE, AMYLASE in the last 168 hours. ?No results for input(s): AMMONIA in the last 168 hours. ? ?CBC: ?Recent Labs  ?Lab 09/13/21 ?0424 09/14/21 ?0431 09/15/21 ?0320 09/16/21 ?0500 09/17/21 ?0140  ?WBC 25.8* 27.8* 30.1* 27.1* 24.1*  ?NEUTROABS 21.4*  23.8* 26.0* 23.2* 20.0*  ?HGB 9.1* 8.9* 9.0* 8.9* 9.4*  ?HCT 30.0* 29.3* 29.5* 29.0* 32.0*  ?MCV 92.3 93.0 93.1 93.2 95.8  ?PLT 429* 451* 413* 395 355  ? ? ?INR: ?Recent Labs  ?Lab 09/13/21 ?0424 09/14/21 ?0431 09/15/21 ?0320 09/16/21 ?0500 09/17/21 ?0140  ?INR 2.3* 2.8* 2.3* 2.0* 1.8*  ? ? ?Other results: ?EKG:  ? ?Imaging: ?No results found. ? ? ?Medications:   ? ? ?Scheduled Medications: ? acetaZOLAMIDE  250 mg Oral BID  ? aspirin EC  81 mg Oral Daily  ? Chlorhexidine Gluconate Cloth  6 each Topical Daily  ? digoxin  0.0625 mg Oral Daily  ? feeding supplement (NEPRO CARB STEADY)  237 mL Per Tube TID BM  ? gabapentin  100 mg Oral TID  ? insulin aspart  0-24 Units Subcutaneous TID WC & HS  ? methimazole  10 mg Oral Daily  ? methimazole  5 mg Oral QPM  ? midodrine  5 mg Oral TID WC  ? mirtazapine  7.5 mg Oral QHS  ? pantoprazole  40 mg Oral Daily  ? potassium chloride  40 mEq Per  Tube Once  ? predniSONE  20 mg Oral Q breakfast  ? quiniDINE gluconate  324 mg Oral BID  ? rosuvastatin  40 mg Oral QHS  ? sildenafil  20 mg Oral TID  ? sodium chloride flush  10-40 mL Intracatheter Q12H  ? torsemide  40 mg Oral Daily  ? warfarin  3.5 mg Oral ONCE-1600  ? Warfarin - Pharmacist Dosing Inpatient   Does not apply [id number]  ? ? ?Infusions: ? sodium chloride    ? sodium chloride Stopped (09/09/21 0758)  ? sodium chloride    ? amiodarone 30 mg/hr (09/17/21 0800)  ? feeding supplement (PIVOT 1.5 CAL) 1,000 mL (09/16/21 1638)  ? lactated ringers    ? lactated ringers Stopped (09/05/21 1010)  ? potassium chloride 10 mEq (09/17/21 0831)  ? ? ?PRN Medications: ?Place/Maintain arterial line **AND** sodium chloride, ALPRAZolam, loperamide HCl, ondansetron (ZOFRAN) IV, oxyCODONE, senna-docusate, sodium chloride, sodium chloride flush, sodium chloride flush, sodium chloride flush, traMADol ? ? ?Assessment:  ?History of nonischemic cardiomyopathy with recent readmission for worsening heart failure and cardiogenic shock ? ?Preoperative bridge with Impella 5.5 LVAD to stabilize hemodynamics and improve organ function. ? ?Patient developed significant leukocytosis without fever and with negative cultures prior to implantation after steroid therapy initiated for hyperthyroid condition. ? ?Implantation of HeartMate 3 and removal of Impella 5.5 on September 03, 2021-perioperative coagulopathy and thrombocytopenia ? ? ?Plan/Discussion:   ?Doing well now- readtyfor transfer to [place] ?DC Foley now off iv lasix and prior enterococcus in urine ?Leave chest tube sutures in place until dC ?I reviewed the LVAD parameters from today, and compared the results to the patient's prior recorded data.  No programming changes were made.  The LVAD is functioning within specified parameters.  The patient performs LVAD self-test daily.  LVAD interrogation was negative for any significant power changes, alarms or PI events/speed drops.  LVAD  equipment check completed and is in good working order.  Back-up equipment present.   LVAD education done on emergency procedures and precautions and reviewed exit site care. ? ?Length of Stay: 22 ? ?Dahlia Byes ?09/17/2021, 9:43 AM  ?

## 2021-09-17 NOTE — Progress Notes (Signed)
Transferred- in from [facility] awake and alert. ?

## 2021-09-17 NOTE — Progress Notes (Signed)
Physical Therapy Treatment ?Patient Details ?Name: [name] ?MRN: [id number] ?DOB: 25-Feb-1971 ?Today's Date: 09/17/2021 ? ? ?History of Present Illness 51yo female adm 3/6 with acute on chronic heart failure exacerbation. Impella placed  3/9. LVAD HM 3 implanted 09/03/21. 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 ? ?  ?PT Comments  ? ? Patient progressing well towards PT goals. Session focused on gait training with use of a RW instead of an EVA walker. Continues to demonstrate weakness in BLEs and instability requiring 2 standing rest breaks. Able to transfer self over to batteries with min A/assist from spouse and increased time. Reports she is better able to control her anxiety without meds. Eager to get to rehab and maximize overall mobility/strength. Will continue to follow. ?   ?Recommendations for follow up therapy are one component of a multi-disciplinary discharge planning process, led by the attending physician.  Recommendations may be updated based on patient status, additional functional criteria and insurance authorization. ? ?Follow Up Recommendations ? Acute inpatient rehab (3hours/day) ?  ?  ?Assistance Recommended at Discharge Frequent or constant Supervision/Assistance  ?Patient can return home with the following A lot of help with bathing/dressing/bathroom;Assistance with cooking/housework;Direct supervision/assist for financial management;A lot of help with walking and/or transfers;Assist for transportation;Help with stairs or ramp for entrance ?  ?Equipment Recommendations ? Rollator (4 wheels);BSC/3in1  ?  ?Recommendations for Other Services   ? ? ?  ?Precautions / Restrictions Precautions ?Precautions: Fall;Sternal ?Precaution Booklet Issued: No ?Precaution Comments: LVAD, sternal, NG tube ?Restrictions ?Weight Bearing Restrictions: Yes ?Other Position/Activity Restrictions: sternal precautions  ?  ? ?Mobility ? Bed Mobility ?Overal bed mobility: Needs Assistance ?Bed  Mobility: Rolling, Sidelying to Sit ?Rolling: Min guard ?Sidelying to sit: Min assist, HOB elevated ?  ?  ?  ?General bed mobility comments: Cues for technique and to adhere to sternal precautions, assist with trunk to get to EOB. ?  ? ?Transfers ?Overall transfer level: Needs assistance ?Equipment used: Rolling walker (2 wheels) ?Transfers: Sit to/from Stand ?Sit to Stand: +2 physical assistance, Mod assist ?  ?  ?  ?  ?  ?General transfer comment: Mod A of 2 to rise from EOB x1 with use of momentum and cues for hand placement, transferred to chair post ambulation. ?  ? ?Ambulation/Gait ?Ambulation/Gait assistance: Min assist, +2 safety/equipment ?Gait Distance (Feet): 150 Feet ?Assistive device: Rolling walker (2 wheels) ?Gait Pattern/deviations: Step-through pattern, Decreased stride length, Trunk flexed, Wide base of support ?Gait velocity: decreased ?Gait velocity interpretation: <1.31 ft/sec, indicative of household ambulator ?  ?General Gait Details: Slow, mildly unsteady gait with truncal sway noted esp when fatigued. Right knee instability. 2 standing rest bresks. Needs 2L 02 [place] for ambulation due to anxiety. ? ? ?Stairs ?  ?  ?  ?  ?  ? ? ?Wheelchair Mobility ?  ? ?Modified Rankin (Stroke Patients Only) ?  ? ? ?  ?Balance Overall balance assessment: Needs assistance ?Sitting-balance support: Feet supported, No upper extremity supported ?Sitting balance-Leahy Scale: Fair ?  ?  ?Standing balance support: During functional activity, Reliant on assistive device for balance ?Standing balance-Leahy Scale: Poor ?Standing balance comment: Reliant on UE support ?  ?  ?  ?  ?  ?  ?  ?  ?  ?  ?  ?  ? ?  ?Cognition Arousal/Alertness: Awake/alert ?Behavior During Therapy: Anxious ?Overall Cognitive Status: Within Functional Limits for tasks assessed ?  ?  ?  ?  ?  ?  ?  ?  ?  ?  ?  ?  ?  ?  ?  ?  ?  General Comments: continued cues for sternal precautions; anxiety better today ?  ?  ? ?  ?Exercises   ? ?  ?General  Comments General comments (skin integrity, edema, etc.): Spouse presenrt during session. VSS. ?  ?  ? ?Pertinent Vitals/Pain Pain Assessment ?Pain Assessment: No/denies pain  ? ? ?Home Living   ?  ?  ?  ?  ?  ?  ?  ?  ?  ?   ?  ?Prior Function    ?  ?  ?   ? ?PT Goals (current goals can now be found in the care plan section) Progress towards PT goals: Progressing toward goals ? ?  ?Frequency ? ? ? Min 4X/week ? ? ? ?  ?PT Plan Current plan remains appropriate  ? ? ?Co-evaluation   ?  ?  ?  ?  ? ?  ?AM-PAC PT "6 Clicks" Mobility   ?Outcome Measure ? Help needed turning from your back to your side while in a flat bed without using bedrails?: A Little ?Help needed moving from lying on your back to sitting on the side of a flat bed without using bedrails?: A Lot ?Help needed moving to and from a bed to a chair (including a wheelchair)?: A Little ?Help needed standing up from a chair using your arms (e.g., wheelchair or bedside chair)?: A Lot ?Help needed to walk in hospital room?: A Little ?Help needed climbing 3-5 steps with a railing? : Total ?6 Click Score: 14 ? ?  ?End of Session   ?Activity Tolerance: Patient tolerated treatment well ?Patient left: in chair;with call [place]/phone within reach;with family/visitor present ?Nurse Communication: Mobility status ?PT Visit Diagnosis: Other abnormalities of gait and mobility (R26.89);Muscle weakness (generalized) (M62.81);Unsteadiness on feet (R26.81);Difficulty in walking, not elsewhere classified (R26.2) ?  ? ? ?Time: 9024-0973 ?PT Time Calculation (min) (ACUTE ONLY): 41 min ? ?Charges:  $Gait Training: 8-22 mins ?$Self Care/Home Management: 8-22          ?          ? ?Marisa Severin, PT, DPT ?Acute Rehabilitation Services ?Pager [phone] ?Office [phone] ? ? ? ? ? ?[place] ?09/17/2021, 1:08 PM ? ?

## 2021-09-17 NOTE — Progress Notes (Deleted)
ANTICOAGULATION CONSULT NOTE - Initial Consult ? ?Pharmacy Consult for warfarin ?Indication:  LVAD HM3 ? ?No Known Allergies ? ?Patient Measurements: ?Height: '5\' 3"'$  (160 cm) ?Weight: 99.7 kg (219 lb 12.8 oz) ?IBW/kg (Calculated) : 52.4 ? ?Vital Signs: ?Temp: 98.6 ?F (37 ?C) (03/27 2300) ?Temp Source: Oral (03/27 2300) ? ?Labs: ?Recent Labs  ?  09/15/21 ?0320 09/15/21 ?1429 09/16/21 ?0500 09/16/21 ?1430 09/17/21 ?0140  ?HGB 9.0*  --  8.9*  --  9.4*  ?HCT 29.5*  --  29.0*  --  32.0*  ?PLT 413*  --  395  --  355  ?LABPROT 25.7*  --  22.6*  --  20.5*  ?INR 2.3*  --  2.0*  --  1.8*  ?CREATININE 1.22*   < > 1.16* 1.22* 1.04*  ? < > = values in this interval not displayed.  ? ? ? ?Estimated Creatinine Clearance: 72.8 mL/min (A) (by C-G formula based on SCr of 1.04 mg/dL (H)). ? ? ?Medical History: ?Past Medical History:  ?Diagnosis Date  ? Automatic implantable cardioverter-defibrillator in situ   ? Chronic CHF (congestive heart failure) (HCC)   ? a. EF 15-20% b. RHC (09/2013) RA 14, RV 57/22, PA 64/36 (48), PCWP 18, FIck CO/CI 3.7/1.6, PVR 8.1 WU, PA sat 47%   ? History of stomach ulcers   ? Hypertension   ? Hypotension   ? Hypothyroidism   ? Morbid obesity ([place])   ? Myocardial infarction [facility]) 08/2013  ? Nocturnal dyspnea   ? Nonischemic cardiomyopathy ([place])   ? Sinus tachycardia   ? Sleep apnea   ? Snoring-prob OSA 09/04/2011  ? Sprint Fidelis ICD lead RECALL  [phone]   ? UARS (upper airway resistance syndrome) 09/04/2011  ? HST 12/2013:  AHI 4/hr (numerous episodes of airflow reduction that did not have concomitant desaturation)   ? ? ?Medications:  ?Scheduled:  ? aspirin EC  81 mg Oral Daily  ? Chlorhexidine Gluconate Cloth  6 each Topical Daily  ? digoxin  0.0625 mg Oral Daily  ? feeding supplement (NEPRO CARB STEADY)  237 mL Per Tube TID BM  ? gabapentin  100 mg Oral TID  ? insulin aspart  0-24 Units Subcutaneous TID WC & HS  ? methimazole  10 mg Oral Daily  ? methimazole  5 mg Oral QPM  ? midodrine  10 mg Oral TID WC  ?  mirtazapine  7.5 mg Oral QHS  ? pantoprazole  40 mg Oral Daily  ? potassium chloride  40 mEq Per Tube Once  ? potassium chloride  40 mEq Per Tube Once  ? predniSONE  20 mg Oral Q breakfast  ? quiniDINE gluconate  324 mg Oral BID  ? rosuvastatin  40 mg Oral QHS  ? sildenafil  20 mg Oral TID  ? sodium chloride flush  10-40 mL Intracatheter Q12H  ? torsemide  40 mg Oral Daily  ? Warfarin - Pharmacist Dosing Inpatient   Does not apply [id number]  ? ? ?Assessment: ?51 yo F who is s/p HM3 LVAD placement on 3/14. Patient has been started on warfarin and baby aspirin. Baby aspirin will be continued indefinitely given prior CVA. Average warfarin dose over the past 7 days is ~2.6 mg. INR has been in range, however has now dropped and is subtherapeutic at 1.8 today. Patient has ben eating more since CoreTrack placed on Friday. LDH had been stable in the 540s, but increased today to 621. Will give slightly higher dose tonight to hopefully get INR back  in range.  ? ?Goal of Therapy:  ?INR 2-2.5 ?Monitor platelets by anticoagulation protocol: Yes ?  ?Plan:  ?Warfarin 3.5 mg tonight ?Daily INR and CBC ? ?Cathrine Muster, PharmD ?PGY2 Cardiology Pharmacy Resident ?Phone: [phone] ?09/17/2021  6:32 AM ? ?Please check [email or web address] for unit-specific pharmacy phone numbers. ? ?Eduard Clos Kamala Kolton ?09/17/2021,6:32 AM ? ? ?

## 2021-09-17 NOTE — Progress Notes (Addendum)
Made aware of hematuria this afternoon.  ? ?Reports feeling tugging around foley this am prior to removal. Concern for trauma. Noticed clot after foley removed. ? ?Now with some blood tinged urine but no additional clots. Denies dysuria. AF. ? ?Suspect hematuria d/t trauma but will check UA.  ? ?Monitor. CBC in am. ?

## 2021-09-18 ENCOUNTER — Inpatient Hospital Stay (HOSPITAL_COMMUNITY): Payer: BC Managed Care – PPO

## 2021-09-18 DIAGNOSIS — I509 Heart failure, unspecified: Secondary | ICD-10-CM | POA: Diagnosis not present

## 2021-09-18 DIAGNOSIS — I5023 Acute on chronic systolic (congestive) heart failure: Secondary | ICD-10-CM | POA: Diagnosis not present

## 2021-09-18 DIAGNOSIS — R570 Cardiogenic shock: Secondary | ICD-10-CM

## 2021-09-18 DIAGNOSIS — I5043 Acute on chronic combined systolic (congestive) and diastolic (congestive) heart failure: Secondary | ICD-10-CM | POA: Diagnosis not present

## 2021-09-18 DIAGNOSIS — I428 Other cardiomyopathies: Secondary | ICD-10-CM

## 2021-09-18 DIAGNOSIS — Z95811 Presence of heart assist device: Secondary | ICD-10-CM

## 2021-09-18 LAB — CBC WITH DIFFERENTIAL/PLATELET
Abs Immature Granulocytes: 0.52 10*3/uL — ABNORMAL HIGH (ref 0.00–0.07)
Basophils Absolute: 0.1 10*3/uL (ref 0.0–0.1)
Basophils Relative: 0 %
Eosinophils Absolute: 0 10*3/uL (ref 0.0–0.5)
Eosinophils Relative: 0 %
HCT: 29.6 % — ABNORMAL LOW (ref 36.0–46.0)
Hemoglobin: 8.6 g/dL — ABNORMAL LOW (ref 12.0–15.0)
Immature Granulocytes: 3 %
Lymphocytes Relative: 11 %
Lymphs Abs: 1.9 10*3/uL (ref 0.7–4.0)
MCH: 28.1 pg (ref 26.0–34.0)
MCHC: 29.1 g/dL — ABNORMAL LOW (ref 30.0–36.0)
MCV: 96.7 fL (ref 80.0–100.0)
Monocytes Absolute: 1.2 10*3/uL — ABNORMAL HIGH (ref 0.1–1.0)
Monocytes Relative: 7 %
Neutro Abs: 14.2 10*3/uL — ABNORMAL HIGH (ref 1.7–7.7)
Neutrophils Relative %: 79 %
Platelets: 299 10*3/uL (ref 150–400)
RBC: 3.06 MIL/uL — ABNORMAL LOW (ref 3.87–5.11)
RDW: 23.3 % — ABNORMAL HIGH (ref 11.5–15.5)
Smear Review: ADEQUATE
WBC: 17.9 10*3/uL — ABNORMAL HIGH (ref 4.0–10.5)
nRBC: 1.1 % — ABNORMAL HIGH (ref 0.0–0.2)

## 2021-09-18 LAB — GLUCOSE, CAPILLARY
Glucose-Capillary: 105 mg/dL — ABNORMAL HIGH (ref 70–99)
Glucose-Capillary: 138 mg/dL — ABNORMAL HIGH (ref 70–99)
Glucose-Capillary: 93 mg/dL (ref 70–99)
Glucose-Capillary: 98 mg/dL (ref 70–99)
Glucose-Capillary: 124 mg/dL — ABNORMAL HIGH (ref 70–99)
Glucose-Capillary: 105 mg/dL — ABNORMAL HIGH (ref 70–99)

## 2021-09-18 LAB — ECHO INTRAOPERATIVE TEE
AR max vel: 1.74 cm2
AV Area VTI: 1.58 cm2
AV Area mean vel: 1.82 cm2
AV Mean grad: 2 mmHg
AV Peak grad: 4.2 mmHg
Ao pk vel: 1.03 m/s
Height: 63 in
Height: 63 in
MV VTI: 1.56 cm2
MV Vena cont: 1.1 cm
Weight: 3552 oz
Weight: 3820.13 oz

## 2021-09-18 LAB — COOXEMETRY PANEL
Carboxyhemoglobin: 2.9 % — ABNORMAL HIGH (ref 0.5–1.5)
Methemoglobin: <0.7 % (ref 0.0–1.5)
O2 Saturation: 63.7 %
Total hemoglobin: 8.9 g/dL — ABNORMAL LOW (ref 12.0–16.0)

## 2021-09-18 LAB — ECHOCARDIOGRAM LIMITED
Height: 63 in
Weight: 3516.78 oz

## 2021-09-18 LAB — BASIC METABOLIC PANEL
Anion gap: 9 (ref 5–15)
Anion gap: 7 (ref 5–15)
BUN: 49 mg/dL — ABNORMAL HIGH (ref 6–20)
BUN: 43 mg/dL — ABNORMAL HIGH (ref 6–20)
CO2: 35 mmol/L — ABNORMAL HIGH (ref 22–32)
CO2: 31 mmol/L (ref 22–32)
Calcium: 8.7 mg/dL — ABNORMAL LOW (ref 8.9–10.3)
Calcium: 8.5 mg/dL — ABNORMAL LOW (ref 8.9–10.3)
Chloride: 97 mmol/L — ABNORMAL LOW (ref 98–111)
Chloride: 102 mmol/L (ref 98–111)
Creatinine, Ser: 1.01 mg/dL — ABNORMAL HIGH (ref 0.44–1.00)
Creatinine, Ser: 0.94 mg/dL (ref 0.44–1.00)
GFR, Estimated: >60 mL/min (ref >60)
GFR, Estimated: >60 mL/min (ref >60)
Glucose, Bld: 111 mg/dL — ABNORMAL HIGH (ref 70–99)
Glucose, Bld: 188 mg/dL — ABNORMAL HIGH (ref 70–99)
Potassium: 2.7 mmol/L — CL (ref 3.5–5.1)
Potassium: 4.9 mmol/L (ref 3.5–5.1)
Sodium: 141 mmol/L (ref 135–145)
Sodium: 140 mmol/L (ref 135–145)

## 2021-09-18 LAB — PROTIME-INR
INR: 2 — ABNORMAL HIGH (ref 0.8–1.2)
Prothrombin Time: 23 seconds — ABNORMAL HIGH (ref 11.4–15.2)

## 2021-09-18 LAB — MAGNESIUM: Magnesium: 2.3 mg/dL (ref 1.7–2.4)

## 2021-09-18 LAB — LACTATE DEHYDROGENASE: LDH: 499 U/L — ABNORMAL HIGH (ref 98–192)

## 2021-09-18 MED ORDER — PIVOT 1.5 CAL PO LIQD
840.0000 mL | ORAL | Status: DC
  Administered 2021-09-18 – 2021-09-19 (×3): 840 mL
  Filled 2021-09-18 (×4): qty 1000

## 2021-09-18 MED ORDER — POTASSIUM CHLORIDE 20 MEQ PO PACK
40.0000 meq | PACK | Freq: Once | ORAL | Status: AC
  Administered 2021-09-18: 40 meq
  Filled 2021-09-18: qty 2

## 2021-09-18 MED ORDER — POTASSIUM CHLORIDE 20 MEQ PO PACK
40.0000 meq | PACK | Freq: Two times a day (BID) | ORAL | Status: DC
  Administered 2021-09-18 – 2021-09-19 (×3): 40 meq
  Filled 2021-09-18 (×3): qty 2

## 2021-09-18 MED ORDER — WARFARIN SODIUM 3 MG PO TABS
3.5000 mg | ORAL_TABLET | Freq: Once | ORAL | Status: AC
  Administered 2021-09-18: 3.5 mg via ORAL
  Filled 2021-09-18: qty 1

## 2021-09-18 MED ORDER — POTASSIUM CHLORIDE 10 MEQ/50ML IV SOLN
10.0000 meq | INTRAVENOUS | Status: AC
  Administered 2021-09-18 (×6): 10 meq via INTRAVENOUS
  Filled 2021-09-18 (×6): qty 50

## 2021-09-18 NOTE — Progress Notes (Addendum)
Speed  Flow  PI  Power  LVIDD  AI  Aortic opening MR  TR  Septum  RV   ?5600 4.8 3.2 4.4 5.8  trivial 4/5 trivial Mild mod Bounce midline Mild HK ?  ?5700 ? 4.9 3.3 4.3 5.3 trivial 5/5 trivial mild Slight right   ?5800 ? 5.1 3.3 4.5 5.2 trivial 4/5 trivial mild Slight right   ? ?            ? ?            ? ?            ? ?Doppler: unable to obtain ? ?A-line: 88/67 (72) ?  ?Ramp ECHO performed at bedside per Dr. [name] ? ?At completion of ramp study, patients primary controller programmed:  ? ?Fixed speed: 5800 ?Low speed limit: 5500 ? ? ?Zada Girt RN, VAD Coordinator ?24/7 pager [phone] ? ? ?

## 2021-09-18 NOTE — Progress Notes (Signed)
ANTICOAGULATION CONSULT NOTE - Initial Consult ? ?Pharmacy Consult for warfarin ?Indication:  LVAD HM3 ? ?No Known Allergies ? ?Patient Measurements: ?Height: '5\' 3"'$  (160 cm) ?Weight: 99.7 kg (219 lb 12.8 oz) ?IBW/kg (Calculated) : 52.4 ? ?Vital Signs: ?Temp: 97.7 ?F (36.5 ?C) (03/29 4315) ?Temp Source: Oral (03/29 0400) ?BP: 129/57 (03/29 0400) ?Pulse Rate: 84 (03/29 0930) ? ?Labs: ?Recent Labs  ?  09/16/21 ?0500 09/16/21 ?1430 09/17/21 ?0140 09/17/21 ?1435 09/18/21 ?0357  ?HGB 8.9*  --  9.4*  --  8.6*  ?HCT 29.0*  --  32.0*  --  29.6*  ?PLT 395  --  355  --  299  ?LABPROT 22.6*  --  20.5*  --  23.0*  ?INR 2.0*  --  1.8*  --  2.0*  ?CREATININE 1.16*   < > 1.04* 1.01* 1.01*  ? < > = values in this interval not displayed.  ? ? ? ?Estimated Creatinine Clearance: 75 mL/min (A) (by C-G formula based on SCr of 1.01 mg/dL (H)). ? ? ?Medical History: ?Past Medical History:  ?Diagnosis Date  ? Automatic implantable cardioverter-defibrillator in situ   ? Chronic CHF (congestive heart failure) (HCC)   ? a. EF 15-20% b. RHC (09/2013) RA 14, RV 57/22, PA 64/36 (48), PCWP 18, FIck CO/CI 3.7/1.6, PVR 8.1 WU, PA sat 47%   ? History of stomach ulcers   ? Hypertension   ? Hypotension   ? Hypothyroidism   ? Morbid obesity ([place])   ? Myocardial infarction [facility]) 08/2013  ? Nocturnal dyspnea   ? Nonischemic cardiomyopathy ([place])   ? Sinus tachycardia   ? Sleep apnea   ? Snoring-prob OSA 09/04/2011  ? Sprint Fidelis ICD lead RECALL  [phone]   ? UARS (upper airway resistance syndrome) 09/04/2011  ? HST 12/2013:  AHI 4/hr (numerous episodes of airflow reduction that did not have concomitant desaturation)   ? ? ?Medications:  ?Scheduled:  ? acetaZOLAMIDE  250 mg Oral BID  ? aspirin EC  81 mg Oral Daily  ? Chlorhexidine Gluconate Cloth  6 each Topical Daily  ? digoxin  0.0625 mg Oral Daily  ? feeding supplement (NEPRO CARB STEADY)  237 mL Per Tube TID BM  ? gabapentin  100 mg Oral TID  ? insulin aspart  0-24 Units Subcutaneous TID WC & HS  ?  methimazole  10 mg Oral Daily  ? methimazole  5 mg Oral QPM  ? midodrine  5 mg Oral TID WC  ? mirtazapine  7.5 mg Oral QHS  ? pantoprazole  40 mg Oral Daily  ? potassium chloride  40 mEq Per Tube BID  ? predniSONE  20 mg Oral Q breakfast  ? quiniDINE gluconate  324 mg Oral BID  ? rosuvastatin  40 mg Oral QHS  ? sildenafil  20 mg Oral TID  ? sodium chloride flush  10-40 mL Intracatheter Q12H  ? torsemide  40 mg Oral Daily  ? Warfarin - Pharmacist Dosing Inpatient   Does not apply Q7619  ? ? ?Assessment: ?51 yo F who is s/p HM3 LVAD placement on 3/14. Patient has been started on warfarin and baby aspirin. Baby aspirin will be continued indefinitely given prior CVA. Average warfarin dose over the past 7 days is ~3 mg. INR therapeutic in range today at 2.0. Patient has ben eating more since CoreTrack placed on Friday. LDH had been stable in the mid 500s. ? ?Goal of Therapy:  ?INR 2-2.5 ?Monitor platelets by anticoagulation protocol: Yes ?  ?Plan:  ?  Warfarin 3.5 mg tonight ?Daily INR and CBC ? ?Cathrine Muster, PharmD ?PGY2 Cardiology Pharmacy Resident ?Phone: [phone] ?09/18/2021  12:15 PM ? ?Please check [email or web address] for unit-specific pharmacy phone numbers. ? ?Eduard Clos Larwence Tu ?09/18/2021,12:15 PM ? ? ?

## 2021-09-18 NOTE — Progress Notes (Signed)
LVAD Coordinator Rounding Note: ? ?Admitted 08/26/21 due to acute on chronic systolic heart failure. Receiving ongoing treatment for hyperthyroidism with Methimazole and steroids. On 08/29/21 Impella 5.5 placed.  ? ?HM III LVAD implanted on 09/03/21 by Dr Prescott Gum under destination therapy criteria due to elevated BMI.  ? ?Pt up to bathroom, husband in room. Back to bed with assistance from OT.  OT worked with patient and husband with VAD equipment.  ? ?Patient reports her ICD has been making "making funny noise", reminder therapy is still off. [employer] rep, Leanna Sato, notified and asked to turn ICD therapy back on per Dr. Claris Gladden request.  ? ?RAMP echo re-scheduled to 1:30 this afternoon per Dr. [name]. VAD Coordinator notified Echo lab.  ? ?Patient will need IP Rehab if approved. Danne Baxter, Rehab Admission Coordinator will begin authorization with [employer] per her note.  ? ? ?Vital signs: ?Temp: 97.7 ?HR: 84 NSR ?Doppler Pressure: not documented ?Auto BP cuff:  70  ?O2 Sat: 93% on RA  ?Wt: 243.2>244.7>248>242.6>242.5>241.2>238.1>219.8>219.8 lbs ? ?LVAD interrogation:  ?Speed: 5600 ?Flow: 4.9 ?Power: 4.1w ?PI:  2.6 ? ?Alarms: none ?Events: 100+ today ? ?Hematocrit: 32 ?Fixed speed: 5600 ?Low speed limit: 5300 ? ?Drive Line: Existing VAD dressing removed and site care performed using sterile technique by pts husband Jamie-observed by VAD coordinator. Drive line exit site cleaned with Chlora prep applicators x 2, RINSED WITH SALINE, allowed to dry, and gauze dressing with Silver strip applied. Exit site healing and unincorporated, the velour is fully implanted at exit site. 2 sutures intact. Large amount of serosanguinous drainage. No redness, tenderness, foul odor or rash noted. Drive line anchor changed.  Pt will need daily dressing changes using daily kit per VAD coordinator or nurse champion. Next dressing change due: 09/19/21.  ? ? ?Labs:  ?LDH trend: 391>485>519>534>530>543>621>499 ? ?INR trend:  0.8>1.6>2.9>2.2>2.3>2.0>1.8>2.0 ? ?WBC trend: 25.1>29.5>30.7>32.4>25.8>27.1>24.1>17.9 ? ?Hgb trend: 6.5>7.5>8.0>9.6>9.5>9.1>8.9>9.4>8.6 ? ?Anticoagulation Plan: ?-INR Goal: 2.0 - 2.5 ?-ASA Dose: 81 mg  ? ?Blood Products:  ?IntraOp: ? 4 PRBCs ? 4 FFP ? 2 Cryo ? 2 platelets ?DDAVP ? 1,400 cell saver ?Post Op: ?09/03/21: Factor 7 ? 1 PRBC ? 1 Platelet ?09/04/21: 1 PRBC ?09/05/21: 1 PRBC ? ?Device: ?-Medtronic  ?-Therapies: OFF>>VAD Coordinator notified [employer] rep to turn therapy on today (09/18/21) ? 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 ?Respiratory: extubated 09/04/21  ? ?Infection: ?08/31/21>> blood cultures>> no growth 5 days; final ?09/04/21>> blood cultures>> no growth 5 days; final ?09/04/21>> sputum culture>> rare candida albicans; final ?09/04/21>> urine culture>> enterococcus faecalis; final ? ?Drips: ?Amiodarone 60 mg/hr ? ?Feeding Supplement:  ?- 50 ml/hr ? ? ?Patient Education: ?Husband performed dressing change today and did well. Provided with size 8 sterile gloves for dressing change.  ? ? ?Plan/Recommendations:  ?1. Page VAD coordinators with VAD equipment and drive line concerns. ?2. Daily drive line dressing change per VAD coordinator or nurse champion using daily kit. ? ?Zada Girt RN ?VAD Coordinator  ?Office: [phone]  ?24/7 Pager: [phone]  ? ? ?  ?

## 2021-09-18 NOTE — Progress Notes (Signed)
Physical Therapy Treatment ?Patient Details ?Name: [name] ?MRN: [id number] ?DOB: [birth year] ?Today's Date: 09/18/2021 ? ? ?History of Present Illness 51yo female adm 3/6 with acute on chronic heart failure exacerbation. Impella placed  3/9. LVAD HM 3 implanted 09/03/21. 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 ? ?  ?PT Comments  ? ? Pt making good progress with mobility, ambulating a further distance but still reliant on UE support. Pt with x1 LOB when nodding head up and down, needing minA to recover. Reviewed sternal precautions and allowed pt to manage her LVAD and switching battery sources with mod cues and some slight physical assistance today. Completed session with a couple lower extremity exercises to improve her strength for improved ease and independence with functional mobility. Will continue to follow acutely. Current recommendations remain appropriate. ?   ?Recommendations for follow up therapy are one component of a multi-disciplinary discharge planning process, led by the attending physician.  Recommendations may be updated based on patient status, additional functional criteria and insurance authorization. ? ?Follow Up Recommendations ? Acute inpatient rehab (3hours/day) ?  ?  ?Assistance Recommended at Discharge Frequent or constant Supervision/Assistance  ?Patient can return home with the following Assistance with cooking/housework;Assist for transportation;Help with stairs or ramp for entrance;A little help with walking and/or transfers;A little help with bathing/dressing/bathroom ?  ?Equipment Recommendations ? Rollator (4 wheels);BSC/3in1  ?  ?Recommendations for Other Services   ? ? ?  ?Precautions / Restrictions Precautions ?Precautions: Fall;Sternal ?Precaution Booklet Issued: Yes (comment) ?Precaution Comments: LVAD, sternal, NG tube ?Restrictions ?Weight Bearing Restrictions: Yes ?Other Position/Activity Restrictions: sternal precautions  ?  ? ?Mobility ? Bed  Mobility ?Overal bed mobility: Needs Assistance ?Bed Mobility: Supine to Sit ?  ?  ?Supine to sit: Min assist, HOB elevated ?  ?Sit to sidelying:  (for BLE back in bed) ?General bed mobility comments: Pt managing legs off EOB well, but needing minA using bed pad to scoot hips to EOB with cues to not push through arms. ?  ? ?Transfers ?Overall transfer level: Needs assistance ?Equipment used: Rolling walker (2 wheels) ?Transfers: Sit to/from Stand ?Sit to Stand: Min assist ?  ?  ?  ?  ?  ?General transfer comment: MinA at buttocks to boost up to stand from EOB, needing cues to keep hands on lap. Pt would quickly reach to pull on RW when it was anterior to her, thus had to move RW distal to her to maintain sternal precautions with transfers. ?  ? ?Ambulation/Gait ?Ambulation/Gait assistance: Min guard ?Gait Distance (Feet): 235 Feet ?Assistive device: Rolling walker (2 wheels) ?Gait Pattern/deviations: Step-through pattern, Decreased stride length, Trunk flexed, Wide base of support ?Gait velocity: reduced ?Gait velocity interpretation: <1.31 ft/sec, indicative of household ambulator ?  ?General Gait Details: Pt slow, mildly unsteady gait using RW. Pt needing up to minA to recover LOB when looking up and down with head movements when ambulating today. ? ? ?Stairs ?  ?  ?  ?  ?  ? ? ?Wheelchair Mobility ?  ? ?Modified Rankin (Stroke Patients Only) ?  ? ? ?  ?Balance Overall balance assessment: Needs assistance ?Sitting-balance support: Feet supported, No upper extremity supported ?Sitting balance-Leahy Scale: Fair ?  ?  ?Standing balance support: Bilateral upper extremity supported, During functional activity ?Standing balance-Leahy Scale: Poor ?Standing balance comment: Reliant on RW, x1 LOB when nodding head up and down when ambulating needing minA to recover ?  ?  ?  ?  ?  ?  ?  ?  ?  ?  ?  ?  ? ?  ?  Cognition Arousal/Alertness: Awake/alert ?Behavior During Therapy: [facility] for tasks assessed/performed ?Overall Cognitive  Status: Within Functional Limits for tasks assessed ?  ?  ?  ?  ?  ?  ?  ?  ?  ?  ?  ?  ?  ?  ?  ?  ?General Comments: sternal precaution cues - good memory for them but needs reminders for follow through ?  ?  ? ?  ?Exercises General Exercises - Lower Extremity ?Hip Flexion/Marching: AROM, Strengthening, Both, 10 reps, Seated ?Mini-Sqauts: AROM, Strengthening, Both, 10 reps, Standing (with RW) ? ?  ?General Comments General comments (skin integrity, edema, etc.): Pt transferring LVAD from battery to wall power with mod cues and minA to start to undo tight binds end of session ?  ?  ? ?Pertinent Vitals/Pain Pain Assessment ?Pain Assessment: Faces ?Faces Pain Scale: Hurts a little bit ?Pain Location: chest at incision ?Pain Descriptors / Indicators: Guarding, Grimacing, Operative site guarding ?Pain Intervention(s): Limited activity within patient's tolerance, Monitored during session, Repositioned  ? ? ?Home Living   ?  ?  ?  ?  ?  ?  ?  ?  ?  ?   ?  ?Prior Function    ?  ?  ?   ? ?PT Goals (current goals can now be found in the care plan section) Acute Rehab PT Goals ?Patient Stated Goal: to go to AIR ?PT Goal Formulation: With patient ?Time For Goal Achievement: 09/20/21 ?Potential to Achieve Goals: Good ?Progress towards PT goals: Progressing toward goals ? ?  ?Frequency ? ? ? Min 4X/week ? ? ? ?  ?PT Plan Current plan remains appropriate  ? ? ?Co-evaluation   ?  ?  ?  ?  ? ?  ?AM-PAC PT "6 Clicks" Mobility   ?Outcome Measure ? Help needed turning from your back to your side while in a flat bed without using bedrails?: A Little ?Help needed moving from lying on your back to sitting on the side of a flat bed without using bedrails?: A Little ?Help needed moving to and from a bed to a chair (including a wheelchair)?: A Little ?Help needed standing up from a chair using your arms (e.g., wheelchair or bedside chair)?: A Little ?Help needed to walk in hospital room?: A Little ?Help needed climbing 3-5 steps with a  railing? : Total ?6 Click Score: 16 ? ?  ?End of Session   ?Activity Tolerance: Patient tolerated treatment well ?Patient left: in chair;with call [place]/phone within reach;with nursing/sitter in room ?Nurse Communication: Mobility status ?PT Visit Diagnosis: Other abnormalities of gait and mobility (R26.89);Muscle weakness (generalized) (M62.81);Unsteadiness on feet (R26.81);Difficulty in walking, not elsewhere classified (R26.2) ?  ? ? ?Time: 4268-3419 ?PT Time Calculation (min) (ACUTE ONLY): 31 min ? ?Charges:  $Gait Training: 8-22 mins ?$Therapeutic Activity: 8-22 mins          ?          ? ?Moishe Spice, PT, DPT ?Acute Rehabilitation Services  ?Pager: [phone] ?Office: [phone] ? ? ? ?[name] ?09/18/2021, 1:43 PM ? ?

## 2021-09-18 NOTE — Progress Notes (Signed)
Brief Nutrition Note:  ? ?Pt transferred to [facility] on 3/28. Pt looks very good on visit today, appears to have more energy. Noted working towards discharge to CIR when able.  ? ?Pt appetite finally starting to improve; taste improving as is her early satiety. ? ?Pt keeping her own record of her po intake. Pt recording meal intake on her meal tray tickets and presented to RD on visit today.  ? ?Pt ate 50% of cheerios, 100% of milk, 100% of banana and 100% of orange juice at breakfast. For lunch, pt ate 100% small fry from [employer], 50% of Hamburger Patty (no bun) with American Cheese and 100% of Fruit Cup. Pt has also eaten a Cup of Peanut Butter and some Catheryn Bacon as a snacks.  ? ?Discussed possibility of transitioning to nocturnal TF at this time with pt. Pt agreeable to this plan.  ? ?Pt reports she is no longer taking the Nepro shakes; RD to discontinue. RD reinforced that without the po intake of oral nutrition supplements, pt will have to optimize her po intake of meals/snacks. Pt understands that plan will be to continue supplemental TF via Cortrak until po intake adequate. Pt agrees.  ? ?Interventions: ?Tube Feeding: Change to 12-hour Nocturnal feeding ?Pivot 1.5 at 70 ml/hr x 12 hours (6pm to 6am daily) ?This provides 1260 kcals, 79 g of protein and 638 mL of free water ?Discontinue Nepro as pt not drinking ?Continue Regular diet ? ? ?Kerman Passey MS, RDN, LDN, CNSC ?Registered Dietitian III ?Clinical Nutrition ?RD Pager and On-Call Pager Number Located in [place]  ? ?

## 2021-09-18 NOTE — Progress Notes (Signed)
Mobility Specialist: Progress Note ? ? 09/18/21 1700  ?Mobility  ?Activity Ambulated with assistance in hallway  ?Level of Assistance Minimal assist, patient does 75% or more  ?Assistive Device Front wheel walker  ?Distance Ambulated (ft) 240 ft  ?Activity Response Tolerated well  ?$Mobility charge 1 Mobility  ? ?Pt received in bed and agreeable to ambulation. C/o feeling SOB at the end of the session, otherwise asymptomatic. Pt sitting EOB after walk with call [place] and phone in reach. Pt's husband present in the room.  ? ?Harrell Gave Starlena Beil ?Mobility Specialist ?Mobility Specialist [place]: [phone] ?Mobility Specialist [place]: [phone] ? ?

## 2021-09-18 NOTE — Progress Notes (Signed)
HeartMate 3 Rounding Note ? ?Postop day  #15 HeartMate 3 Implantation March 14 ? ?Subjective:   ?Patient maintaining sinus rhythm  on IV amiodarone ?Lasix drip off, edema much improved ?Ramp Echo study- speed up to 5800 rpm ?Appetite improving daily- transition to HS tube feeds ?Incisions healing, chest tube sutures remain  ? ? ?LVAD INTERROGATION:  ?HeartMate 3LVAD:  Flow 5.0 liters/min, speed 5800, power 4.8 PI 3.0.  Controller  intact.  ? ?Objective:   ? ?Vital Signs:   ?Temp:  [97.7 ?F (36.5 ?C)-99.5 ?F (37.5 ?C)] 97.7 ?F (36.5 ?C) (03/29 5573) ?Pulse Rate:  [50-84] 84 (03/29 0930) ?Resp:  [16-20] 17 (03/29 2202) ?BP: (67-129)/(49-57) 129/57 (03/29 0400) ?SpO2:  [93 %-94 %] 93 % (03/29 0400) ?Arterial Line BP: (73-85)/(59-65) 82/65 (03/29 0400) ?Weight:  [99.7 kg] 99.7 kg (03/29 0443) ?Last BM Date : 09/17/21 ?Mean arterial Pressure 65-13m Hg ? ?Intake/Output:  ? ?Intake/Output Summary (Last 24 hours) at 09/18/2021 1629 ?Last data filed at 09/18/2021 1300 ?Gross per 24 hour  ?Intake 1953.47 ml  ?Output 600 ml  ?Net 1353.47 ml  ?  ? ?Physical Exam: ?General:  Well appearing. No resp difficulty, mild tachypnea. Sternal incision dry ?HEENT: normal ?Neck: supple. JVP . 2+ carotids  no bruits. No lymphadenopathy or thryomegaly appreciated. L neck drain for Impella 5.5 out and incision healing ?Cor: Mechanical heart sounds with LVAD hum present. ?Lungs: clear ?Abdomen: soft, nontender, nondistended. No hepatosplenomegaly. No bruits or masses. Good bowel sounds. ?Extremities: no cyanosis, clubbing, rash, edema ?Neuro: alert & orientedx3, cranial nerves grossly intact. moves all 4 extremities w/o difficulty. Affect pleasant ? ?Telemetry: sinus tach ? ?Labs: ?Basic Metabolic Panel: ?Recent Labs  ?Lab 09/13/21 ?1436 09/14/21 ?0204 09/14/21 ?0431 09/14/21 ?0542703/25/23 ?1629 09/15/21 ?0320 09/15/21 ?1429 09/16/21 ?0500 09/16/21 ?1430 09/17/21 ?0140 09/17/21 ?1435 09/18/21 ?0357 09/18/21 ?1418  ?NA 137   < > 137   < > 136  137   < > 137 139 138 140 141 140  ?K 3.7  3.8   < > 4.0   < > 3.4* 2.6*   < > 2.2* 3.8 3.3* 5.4* 2.7* 4.9  ?CL 91*   < > 91*   < > 88* 85*   < > 84* 91* 92* 99 97* 102  ?CO2 35*   < > 38*   < > 38* 41*   < > 42* 39* 38* 32 35* 31  ?GLUCOSE 110*   < > 119*   < > 141* 122*   < > 113* 122* 103* 155* 111* 188*  ?BUN 38*   < > 39*   < > 43* 44*   < > 55* 56* 58* 48* 49* 43*  ?CREATININE 1.33*   < > 1.28*   < > 1.23* 1.22*   < > 1.16* 1.22* 1.04* 1.01* 1.01* 0.94  ?CALCIUM 9.3   < > 9.2   < > 9.1 9.1   < > 8.9 9.2 9.0 8.4* 8.7* 8.5*  ?MG 2.0  --  1.9  --  2.6* 2.2  --  2.0  --  2.2  --  2.3  --   ?PHOS 3.5  --  2.9  --  3.1  --   --   --   --   --   --   --   --   ? < > = values in this interval not displayed.  ? ? ?Liver Function Tests: ?No results for input(s): AST, ALT, ALKPHOS, BILITOT, PROT, ALBUMIN in  the last 168 hours. ? ?No results for input(s): LIPASE, AMYLASE in the last 168 hours. ?No results for input(s): AMMONIA in the last 168 hours. ? ?CBC: ?Recent Labs  ?Lab 09/14/21 ?0431 09/15/21 ?0320 09/16/21 ?0500 09/17/21 ?0140 09/18/21 ?0357  ?WBC 27.8* 30.1* 27.1* 24.1* 17.9*  ?NEUTROABS 23.8* 26.0* 23.2* 20.0* 14.2*  ?HGB 8.9* 9.0* 8.9* 9.4* 8.6*  ?HCT 29.3* 29.5* 29.0* 32.0* 29.6*  ?MCV 93.0 93.1 93.2 95.8 96.7  ?PLT 451* 413* 395 355 299  ? ? ?INR: ?Recent Labs  ?Lab 09/14/21 ?0431 09/15/21 ?0320 09/16/21 ?0500 09/17/21 ?0140 09/18/21 ?0357  ?INR 2.8* 2.3* 2.0* 1.8* 2.0*  ? ? ?Other results: ?EKG:  ? ?Imaging: ?DG Chest Port 1 View ? ?Result Date: 09/18/2021 ?CLINICAL DATA:  LVAD. EXAM: PORTABLE CHEST 1 VIEW COMPARISON:  Chest radiograph 09/15/2021 FINDINGS: Patient has an LVAD and left chest ICD. Stable cardiomegaly. Feeding tube extends in the abdomen and the tip is incompletely visualized. Right arm PICC line tip terminates in the right atrium region. Patchy densities in the right lower lung are minimally changed. Slightly improved aeration in the lungs compared to the recent comparison examination.  IMPRESSION: 1. Slightly improved aeration in the lungs with persistent densities in the right lower chest. These densities are nonspecific but may be related to atelectasis. 2. Support apparatuses as described. 3. Cardiomegaly. Electronically Signed   By: Markus Daft M.D.   On: 09/18/2021 08:21   ? ? ?Medications:   ? ? ?Scheduled Medications: ? acetaZOLAMIDE  250 mg Oral BID  ? aspirin EC  81 mg Oral Daily  ? Chlorhexidine Gluconate Cloth  6 each Topical Daily  ? digoxin  0.0625 mg Oral Daily  ? gabapentin  100 mg Oral TID  ? insulin aspart  0-24 Units Subcutaneous TID WC & HS  ? methimazole  10 mg Oral Daily  ? methimazole  5 mg Oral QPM  ? midodrine  5 mg Oral TID WC  ? mirtazapine  7.5 mg Oral QHS  ? pantoprazole  40 mg Oral Daily  ? potassium chloride  40 mEq Per Tube BID  ? predniSONE  20 mg Oral Q breakfast  ? quiniDINE gluconate  324 mg Oral BID  ? rosuvastatin  40 mg Oral QHS  ? sildenafil  20 mg Oral TID  ? sodium chloride flush  10-40 mL Intracatheter Q12H  ? torsemide  40 mg Oral Daily  ? warfarin  3.5 mg Oral ONCE-1600  ? Warfarin - Pharmacist Dosing Inpatient   Does not apply [id number]  ? ? ?Infusions: ? sodium chloride    ? sodium chloride Stopped (09/18/21 0625)  ? sodium chloride    ? amiodarone 30 mg/hr (09/18/21 1300)  ? feeding supplement (PIVOT 1.5 CAL)    ? lactated ringers    ? lactated ringers Stopped (09/05/21 1010)  ? ? ?PRN Medications: ?Place/Maintain arterial line **AND** sodium chloride, ALPRAZolam, loperamide HCl, ondansetron (ZOFRAN) IV, oxyCODONE, senna-docusate, sodium chloride, sodium chloride flush, sodium chloride flush, sodium chloride flush, traMADol ? ? ?Assessment:  ?History of nonischemic cardiomyopathy with recent readmission for worsening heart failure and cardiogenic shock ? ?Preoperative bridge with Impella 5.5 LVAD to stabilize hemodynamics and improve organ function. ? ?Patient developed significant leukocytosis without fever and with negative cultures prior to implantation  after steroid therapy initiated for hyperthyroid condition. ? ?Implantation of HeartMate 3 and removal of Impella 5.5 on September 03, 2021-perioperative coagulopathy and thrombocytopenia ? ? ?Plan/Discussion:   ?Doing well now on 2C ?Foley out ,  A-line remains to monitor BP ?WBC decreasing ?Working toward CIR placement ? ?I reviewed the LVAD parameters from today, and compared the results to the patient's prior recorded data.  No programming changes were made.  The LVAD is functioning within specified parameters.  The patient performs LVAD self-test daily.  LVAD interrogation was negative for any significant power changes, alarms or PI events/speed drops.  LVAD equipment check completed and is in good working order.  Back-up equipment present.   LVAD education done on emergency procedures and precautions and reviewed exit site care. ? ?Length of Stay: 23 ? ?Dahlia Byes ?09/18/2021, 4:29 PM  ?

## 2021-09-18 NOTE — Progress Notes (Signed)
Occupational Therapy Treatment ?Patient Details ?Name: [name] ?MRN: [id number] ?DOB: Nov 01, 1970 ?Today's Date: 09/18/2021 ? ? ?History of present illness 51yo female adm 3/6 with acute on chronic heart failure exacerbation. Impella placed  3/9. LVAD HM 3 implanted 09/03/21. 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 ?  ?OT comments ? Pt progressing well towards OT goals this session. When OT arrived, Pt was in the restroom on the commode. Pt required mod A for rear peri care post-toileting. Initially performing breaking sternal precautions, husband then assisting as caregiver for thoroughness. Pt ambulating in room without DME - but requiring 1 person HHA (assist provided by husband and OT managed lines) and reaching out for external assist from environment (furniture surfing) Educated Pt and husband on safety and benefits of using a RW for mobility - including energy conservation) Pt able to perform standing grooming at sink level requiring rest breaks in standing and attempting to assume tripod position leaning on the sink. Pt then mod A to return supine (assist for BLE back into bed) for drive line change education with VAD team. Pt and husband provided with sternal precautions handout, reviewed and left with them to read in more detail. At this time AIR remains essential for safety, independence in ADL and functional transfers as well as maintaining sternal precautions, functioning with new VAD. OT will continue to follow acutely.   ? ?Recommendations for follow up therapy are one component of a multi-disciplinary discharge planning process, led by the attending physician.  Recommendations may be updated based on patient status, additional functional criteria and insurance authorization. ?   ?Follow Up Recommendations ? Acute inpatient rehab (3hours/day)  ?  ?Assistance Recommended at Discharge Frequent or constant Supervision/Assistance  ?Patient can return home with the following ? A  lot of help with bathing/dressing/bathroom;A little help with walking and/or transfers ?  ?Equipment Recommendations ? Other (comment) (defer to next venue of care)  ?  ?Recommendations for Other Services   ? ?  ?Precautions / Restrictions Precautions ?Precautions: Fall;Sternal ?Precaution Booklet Issued: Yes (comment) (provided and reviewed with patient) ?Precaution Comments: LVAD, sternal, NG tube ?Restrictions ?Weight Bearing Restrictions: Yes ?RUE Weight Bearing:  (sternal prec) ?LUE Weight Bearing:  (sternal prec.) ?Other Position/Activity Restrictions: sternal precautions  ? ? ?  ? ?Mobility Bed Mobility ?Overal bed mobility: Needs Assistance ?Bed Mobility: Sit to Sidelying, Rolling ?Rolling: Min guard ?  ?  ?  ?Sit to sidelying: Mod assist (for BLE back in bed) ?General bed mobility comments: Cues for technique and to adhere to sternal precautions, assist with BLE back into bed ?  ? ?Transfers ?Overall transfer level: Needs assistance ?Equipment used: None (reaching out for external assist from environment and also utilizing HHA from husband) ?Transfers: Sit to/from Stand ?Sit to Stand: Min assist ?  ?  ?  ?  ?  ?General transfer comment: min A for steadying boost and cues for sternal precautions ?  ?  ?Balance Overall balance assessment: Needs assistance ?Sitting-balance support: Feet supported, No upper extremity supported ?Sitting balance-Leahy Scale: Fair ?  ?  ?Standing balance support: Bilateral upper extremity supported, During functional activity ?Standing balance-Leahy Scale: Poor ?Standing balance comment: reliant on external source of balance from 1 person HHA or environment ?  ?  ?  ?  ?  ?  ?  ?  ?  ?  ?  ?   ? ?ADL either performed or assessed with clinical judgement  ? ?ADL Overall ADL's :  Needs assistance/impaired ?  ?  ?Grooming: Oral care;Wash/dry hands;Min guard;Standing ?Grooming Details (indicate cue type and reason): sink level ?  ?  ?  ?  ?  ?  ?  ?  ?Toilet Transfer: Minimal  assistance;Ambulation ?Toilet Transfer Details (indicate cue type and reason): no DME, HHA from husband and furniture surfing. Educated on use of RW as energy conservation technique and for safety ?Toileting- Clothing Manipulation and Hygiene: Moderate assistance;Sit to/from stand ?Toileting - Clothing Manipulation Details (indicate cue type and reason): When OT entered the room, Pt was performing rear peri care by reaching around back. Edcucated on sternal precautions, and husband ended up completing the rest of peri care ?  ?  ?Functional mobility during ADLs: Minimal assistance (one person HHA) ?General ADL Comments: provided handout and focused on sternal precautions education with focus on ADL including. Pt currently requiring mod cues for sternal precautions and reports soreness in her sternum ?  ? ?Extremity/Trunk Assessment Upper Extremity Assessment ?Upper Extremity Assessment: Generalized weakness ?  ?Lower Extremity Assessment ?Lower Extremity Assessment: Defer to PT evaluation ?  ?  ?  ? ?Vision   ?Vision Assessment?: No apparent visual deficits ?Additional Comments: reading glasses at baseline - needed to read sternal handout ?  ?Perception   ?  ?Praxis   ?  ? ?Cognition Arousal/Alertness: Awake/alert ?Behavior During Therapy: [facility] for tasks assessed/performed ?Overall Cognitive Status: Within Functional Limits for tasks assessed ?  ?  ?  ?  ?  ?  ?  ?  ?  ?  ?  ?  ?  ?  ?  ?  ?General Comments: sternal precaution cues - good memory for them, poor implementation ?  ?  ?   ?Exercises   ? ?  ?Shoulder Instructions   ? ? ?  ?General Comments Husband present during session, VSS on RA, WOB increased, but SpO2 remained >90%  ? ? ?Pertinent Vitals/ Pain       Pain Assessment ?Pain Assessment: Faces ?Faces Pain Scale: Hurts a little bit ?Pain Location: chest at incision ?Pain Descriptors / Indicators: Guarding, Sore, Grimacing, Operative site guarding ?Pain Intervention(s): Monitored during session (cues for  sternal precautions) ? ?Home Living   ?  ?  ?  ?  ?  ?  ?  ?  ?  ?  ?  ?  ?  ?  ?  ?  ?  ?  ? ?  ?Prior Functioning/Environment    ?  ?  ?  ?   ? ?Frequency ? Min 2X/week  ? ? ? ? ?  ?Progress Toward Goals ? ?OT Goals(current goals can now be found in the care plan section) ? Progress towards OT goals: Progressing toward goals ? ?Acute Rehab OT Goals ?Patient Stated Goal: get better and take a vacation to the beach ?OT Goal Formulation: With patient/family ?Time For Goal Achievement: 09/27/21 ?Potential to Achieve Goals: Good  ?Plan Discharge plan remains appropriate   ? ?Co-evaluation ? ? ?   ?  ?  ?  ?  ? ?  ?AM-PAC OT "6 Clicks" Daily Activity     ?Outcome Measure ? ? Help from another person eating meals?: None ?Help from another person taking care of personal grooming?: A Little ?Help from another person toileting, which includes using toliet, bedpan, or urinal?: A Lot ?Help from another person bathing (including washing, rinsing, drying)?: A Lot ?Help from another person to put on and taking off regular upper body clothing?: A Lot ?Help  from another person to put on and taking off regular lower body clothing?: A Lot ?6 Click Score: 15 ? ?  ?End of Session Equipment Utilized During Treatment: Gait belt ? ?OT Visit Diagnosis: Other abnormalities of gait and mobility (R26.89);Muscle weakness (generalized) (M62.81) ?Pain - part of body:  (sternal incision) ?  ?Activity Tolerance Patient tolerated treatment well ?  ?Patient Left in bed;with call [place]/phone within reach;with family/visitor present ?  ?Nurse Communication Mobility status;Precautions ?  ? ?   ? ?Time: [phone] ?OT Time Calculation (min): 28 min ? ?Charges: OT General Charges ?$OT Visit: 1 Visit ?OT Treatments ?$Self Care/Home Management : 23-37 mins ? ?Jesse Sans OTR/L ?Acute Rehabilitation Services ?Pager: [phone] ?Office: [phone] ? ? ?Merri Ray Kyston Gonce ?09/18/2021, 10:16 AM ?

## 2021-09-18 NOTE — Progress Notes (Addendum)
Inpatient Rehabilitation Admissions Coordinator  ? ?I will begin Auth with [employer] for possible CIR admit later this week. I met at bedside with patient and her spouse and they are in agreement. ? ?Danne Baxter, RN, MSN ?Rehab Admissions Coordinator ?[phone] ?09/18/2021 8:20 AM ? ?

## 2021-09-18 NOTE — Progress Notes (Deleted)
Patient fell while daughter attempted to assist patient to bedside commode.  Daughter came out to nurses station to get help.  Patient was assisted back into bed by staff. She denies injury and VSS. Dr. Marcelle Smiling notified via text page at [email or web address]. ?

## 2021-09-18 NOTE — Progress Notes (Signed)
This chaplain attempted a visit. The Pt. And Pt. husband are participating in bedside education. This chaplain will attempt a revisit at another time. ? ?Chaplain Sallyanne Kuster ?[phone]  ?

## 2021-09-18 NOTE — Progress Notes (Addendum)
Patient ID: [name], female   DOB: 1970-12-30, 51 y.o.   MRN: [id number] ?HeartMate 2 Rounding Note ? ?Subjective:   ? ?- 3/14: HM3 LVAD placement with removal of Impella 5.5  ?- 3/15: Speed increased to 5300 rpm. Given 1UPRBC. Febrile. Cultures obtained. Extubated.  ?- 3/16 VT paced out. On amio drip. Given 1UPRBCs . Given couple doses of IV lasix.  ?- 3/18 Rapid ALF. Bolused multiple times with amio and drip turned up to 60. Now back in NSR ?- 3/19 VAD Speed increased to 5400 (high PI)  ?- 3/20 Recurrent ALF. Bolused w/ amio. Milrinone discontinued ?- 3/22 Ramp echo, Fixed Speed 5600  ?- 3/28  Transferred out of CCU ? ?Co-ox 64%  ? ?1.7L in UOP yesterday. Wt stable. CVP 8-9 ? ?Remains in NSR  ? ?MAPs upper 60s-70s ? ?Scr stable at 1.01.  ? ?K 2.7>>getting IV K supp  ?Mg 2.3 ? ?WBC continues to trend down, 27>>24>>17K. AF. Remains on prednisone.  ? ?Getting TFs via [employer]. Feels appetite is picking up.  ? ?Feeling better. No dyspnea.  ? ?LVAD INTERROGATION:  ?HeartMate II LVAD:  Flow 4.9, speed 5600, power 4.3, PI 3.0. Numerous PI events. VAD interrogated personally. Parameters stable. ? ? ?Objective:   ? ?Vital Signs:   ?Temp:  [97.7 ?F (36.5 ?C)-99.5 ?F (37.5 ?C)] 97.7 ?F (36.5 ?C) (03/29 0400) ?Pulse Rate:  [50-83] 79 (03/29 0400) ?Resp:  [16-28] 18 (03/29 0400) ?BP: (67-129)/(49-57) 129/57 (03/29 0400) ?SpO2:  [93 %-94 %] 93 % (03/29 0400) ?Arterial Line BP: (66-124)/(59-108) 82/65 (03/29 0400) ?Weight:  [99.7 kg] 99.7 kg (03/29 0443) ?Last BM Date : 09/17/21 ?Mean arterial Pressure  upper 60s-70s  ? ?Intake/Output:  ? ?Intake/Output Summary (Last 24 hours) at 09/18/2021 0709 ?Last data filed at 09/18/2021 0530 ?Gross per 24 hour  ?Intake 3086.85 ml  ?Output 1725 ml  ?Net 1361.85 ml  ?  ?Physical Exam: ?CVP 8-9  ?GENERAL: Sitting up in chair. No distress. Sitting up in bed. ?HEENT: normal  ?NECK: Supple, JVP 9 cm.  2+ bilaterally, no bruits.   ?CARDIAC:  Mechanical heart sounds with LVAD hum present.  ?LUNGS:   Clear to auscultation bilaterally. No wheezing  ?ABDOMEN:  Soft, round, nontender, positive bowel sounds x4.     ?LVAD exit site: well-healed and incorporated.  Dressing dry and intact.  No erythema or drainage.  Stabilization device present and accurately applied.  Driveline dressing is being changed daily per sterile technique. ?EXTREMITIES:  Warm and dry, no cyanosis, clubbing, rash, + pedal edema ?NEUROLOGIC:  Alert and oriented x 4.  Gait steady.  No aphasia.  No dysarthria.  Affect pleasant.    ? ?Telemetry: NSR 70s ? ?Labs: ?Basic Metabolic Panel: ?Recent Labs  ?Lab 09/13/21 ?1436 09/14/21 ?0204 09/14/21 ?0431 09/14/21 ?5366 09/14/21 ?1629 09/15/21 ?0320 09/15/21 ?1429 09/16/21 ?0500 09/16/21 ?1430 09/17/21 ?0140 09/17/21 ?1435 09/18/21 ?0357  ?NA 137   < > 137   < > 136 137   < > 137 139 138 140 141  ?K 3.7  3.8   < > 4.0   < > 3.4* 2.6*   < > 2.2* 3.8 3.3* 5.4* 2.7*  ?CL 91*   < > 91*   < > 88* 85*   < > 84* 91* 92* 99 97*  ?CO2 35*   < > 38*   < > 38* 41*   < > 42* 39* 38* 32 35*  ?GLUCOSE 110*   < > 119*   < >  141* 122*   < > 113* 122* 103* 155* 111*  ?BUN 38*   < > 39*   < > 43* 44*   < > 55* 56* 58* 48* 49*  ?CREATININE 1.33*   < > 1.28*   < > 1.23* 1.22*   < > 1.16* 1.22* 1.04* 1.01* 1.01*  ?CALCIUM 9.3   < > 9.2   < > 9.1 9.1   < > 8.9 9.2 9.0 8.4* 8.7*  ?MG 2.0  --  1.9  --  2.6* 2.2  --  2.0  --  2.2  --  2.3  ?PHOS 3.5  --  2.9  --  3.1  --   --   --   --   --   --   --   ? < > = values in this interval not displayed.  ? ? ?Liver Function Tests: ?No results for input(s): AST, ALT, ALKPHOS, BILITOT, PROT, ALBUMIN in the last 168 hours. ? ?No results for input(s): LIPASE, AMYLASE in the last 168 hours. ?No results for input(s): AMMONIA in the last 168 hours. ? ?CBC: ?Recent Labs  ?Lab 09/14/21 ?0431 09/15/21 ?0320 09/16/21 ?0500 09/17/21 ?0140 09/18/21 ?0357  ?WBC 27.8* 30.1* 27.1* 24.1* 17.9*  ?NEUTROABS 23.8* 26.0* 23.2* 20.0* 14.2*  ?HGB 8.9* 9.0* 8.9* 9.4* 8.6*  ?HCT 29.3* 29.5* 29.0* 32.0*  29.6*  ?MCV 93.0 93.1 93.2 95.8 96.7  ?PLT 451* 413* 395 355 299  ? ? ?INR: ?Recent Labs  ?Lab 09/14/21 ?0431 09/15/21 ?0320 09/16/21 ?0500 09/17/21 ?0140 09/18/21 ?0357  ?INR 2.8* 2.3* 2.0* 1.8* 2.0*  ? ? ?Other results: ? ? ?Imaging: ?No results found. ? ? ?Medications:   ? ? ?Scheduled Medications: ? acetaZOLAMIDE  250 mg Oral BID  ? aspirin EC  81 mg Oral Daily  ? Chlorhexidine Gluconate Cloth  6 each Topical Daily  ? digoxin  0.0625 mg Oral Daily  ? feeding supplement (NEPRO CARB STEADY)  237 mL Per Tube TID BM  ? gabapentin  100 mg Oral TID  ? insulin aspart  0-24 Units Subcutaneous TID WC & HS  ? methimazole  10 mg Oral Daily  ? methimazole  5 mg Oral QPM  ? midodrine  5 mg Oral TID WC  ? mirtazapine  7.5 mg Oral QHS  ? pantoprazole  40 mg Oral Daily  ? potassium chloride  40 mEq Per Tube Once  ? potassium chloride  40 mEq Per Tube BID  ? predniSONE  20 mg Oral Q breakfast  ? quiniDINE gluconate  324 mg Oral BID  ? rosuvastatin  40 mg Oral QHS  ? sildenafil  20 mg Oral TID  ? sodium chloride flush  10-40 mL Intracatheter Q12H  ? torsemide  40 mg Oral Daily  ? Warfarin - Pharmacist Dosing Inpatient   Does not apply [id number]  ? ? ?Infusions: ? sodium chloride    ? sodium chloride Stopped (09/18/21 0524)  ? sodium chloride    ? amiodarone 30 mg/hr (09/18/21 0629)  ? feeding supplement (PIVOT 1.5 CAL) 50 mL/hr at 09/17/21 1900  ? lactated ringers    ? lactated ringers Stopped (09/05/21 1010)  ? potassium chloride 10 mEq (09/18/21 0625)  ? ? ?PRN Medications: ?Place/Maintain arterial line **AND** sodium chloride, ALPRAZolam, loperamide HCl, ondansetron (ZOFRAN) IV, oxyCODONE, senna-docusate, sodium chloride, sodium chloride flush, sodium chloride flush, sodium chloride flush, traMADol ? ? ? ?Plan/Discussion:   ? ? 1. Acute on chronic systolic HF: Nonischemic cardiomyopathy.  She has  a [employer] ICD. Cause for cardiomyopathy is uncertain, initially identified peri-partum (after son's birth), so cannot rule out  peri-partum CMP.  On and off, she has had frequent PVCs.   RHC in 8/15 showed normal filling pressures and preserved cardiac output. Echo in 4/22 with EF 25%, severe LV dilation.  CPX (8/22) with mild-moderate HF limitation.  Echo this admission with EF < 20% with severe dilation, normal RV size with mildly decreased systolic function, moderate-severe functional MR with dilated IVC. She was admitted with volume overload, NYHA class IV symptoms and low output. HCO3 18 and creatinine 1.5 on admit, co-ox 47%. Milrinone 0.25 started + lasix gtt.  I also suspect hyperthyroidism is playing a role in her CHF exacerbation. 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 Impella 5.5 placed 3/9.  Patient had HM3 LVAD placed 3/15. 3/15 Speed increased to 5300 rpm. 3/19 Speed increased to 5400. Ramp echo 3/22, fixed speed increased to 5600.  She is off pressors and milrinone.  Co-ox stable, 64%. CVP 8-9. On po Torsemide 40 mg daily + po diamox 250 BID. Weight down 30 lb from post-op. ?- Continue torsemide 40 daily + 250 Diamox 250 mg bid  ?- Off spiro with soft BP ?- Continue Digoxin 0.0625 mg  ?- Continue asa 81 mg daily. Will need long-term with h/o CVA ?- Continue sildenafil 20 mg tid  ?- Warfarin for VAD. INR 2.0. Dosing per pharmD. Discussed personally. ?- LDH 548>[id number]   ?- Continue midodrine 5 mg TID ?2. Acute hypoxemic respiratory failure: Intubated post-op. Extubated, stable on RA. Continue aggressive pulmonary toilet.  ?- Ambulate.   ?3. Morbid obesity: BMI out of range for heart transplant.  ?4. 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  She has been on Levoxyl at home but does not think she has taken for a month.  Now hyperthyroid.  This likely contributes to her CHF exacerbation. [facility] evaluated her for this. NSVT on milrinone made it difficult for [name] to stop  amiodarone.  ?- Solumedrol 20 mg IV daily -> transitioned to prednisone 20 on 3/16 and methimazole bid.  ?- per IM, continue prednisone 20 mg x 1 month, then reduce down to 10 mg until endo follow-up  ?- Will n

## 2021-09-18 NOTE — Progress Notes (Signed)
Date and time results received: 09/18/21 0443 ? ?Test: K ?Critical Value: 2.7  ? ?Name of Provider Notified: Narcisse ? ?Orders Received? Or Actions Taken?: Orders Received - See Orders for details ?

## 2021-09-18 NOTE — PMR Pre-admission (Signed)
PMR Admission Coordinator Pre-Admission Assessment ? ?Patient: [name] is an 51 y.o., female ?MRN: [id number] ?DOB: [birth year] ?Height: '5\' 3"'$  (160 cm) ?Weight: 100.3 kg ? ?Insurance Information ?HMO:     PPO: yes     PCP:      IPA:      80/20:      OTHER:  ?PRIMARY: [employer] [place]      Policy#: [id number]      Subscriber: spouse ?CM Name: approved via fax      Phone#: [phone]     Fax#: [phone] ?Pre-Cert#: [id number] approved until 10/09/21      Employer: [employer] ?Benefits:  Phone #: [phone]     Name: 3/29 ?Eff. Date: 06/23/2021     Deduct: $500      Out of Pocket Max: $1500      Life Max: none ?CIR: 90%      SNF: 90% 100 days per year ?Outpatient: 90%     Co-Pay: 60 combined visits ?Home Health: 90%      Co-Pay: 100 combined visits ?DME: 90%     Co-Pay: 10% ?Providers: in network ? ?SECONDARY: Medicare a and b      Policy#: [id number] per passport one source a active 11/21/2009 and b 07/24/2012 ? ?Financial Counselor:       Phone#:  ? ?The ?Data Collection Information Summary? for patients in Inpatient Rehabilitation Facilities with attached ?Privacy Act [place] Records? was provided and verbally reviewed with: Patient and Family ? ?Emergency Contact Information ?Contact Information   ? ? Name Relation Home Work Mobile  ? Cloteal, Isaacson Spouse [phone]  [phone]  ? Hoogland,Desiree Daughter [phone]  [phone]  ? ?  ? ? ?Current Medical History  ?Patient Admitting Diagnosis: debility, acute on chronic systolic Heart failure s/p LVAD placement ? 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 Presented on 08/25/2021 with worsening SOB, fatigue, nausea chest tightness and palpitations. Admitted with volume overload and low output.   ? ?Placed on milrinone and lasix IV. Felt hypothyroidism playing a role in her CHF exacerbation. 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 Impella placed 3/9.Heart mate III LVAD placement on 3/14/203 . Now off pressors and on po Torsemide and diamox. Weight down 30 lbs from pre op. Placed on Warfarin and dosing per pharmacy. Continue Midodrine.  BMI out of range for heart transplant.  ? ?Has history of hypothyroidism thought to be associated with amiodarone. NSVT on milrinone makes it difficult to stop. Began solumedrol IV and transitioned to prednisone and methimazole. Will need outpatient endocrinology follow up. Continue amiodarone for now with NSVT and rapid atrial fibrillation. ? ? Elevated WBC's felt due to steroids, not infection. Urine culture felt likely colonized. Completed Vancomycin and Cefazolin. As well as empiric cefepime. Malnutrition with prealbumin 13. Remeron added to stimulate appetite, now with TF via cortrak.  ? ?Patient's medical record from [facility] has been reviewed by the rehabilitation admission coordinator and physician. ? ?Past Medical History  ?Past Medical History:  ?Diagnosis Date  ? Automatic implantable cardioverter-defibrillator in situ   ? Chronic CHF (congestive heart failure) (HCC)   ? a. EF 15-20% b. RHC (09/2013) RA 14, RV 57/22, PA 64/36 (48), PCWP 18, FIck CO/CI 3.7/1.6, PVR 8.1 WU, PA sat 47%   ?  History of stomach ulcers   ? Hypertension   ? Hypotension   ? Hypothyroidism   ? Morbid obesity ([place])   ? Myocardial infarction [facility]) 08/2013  ? Nocturnal dyspnea   ? Nonischemic cardiomyopathy ([place])   ? Sinus tachycardia   ? Sleep apnea   ? Snoring-prob OSA 09/04/2011  ? [employer] ICD lead RECALL  [id number]   ? UARS (upper airway resistance syndrome) 09/04/2011  ? HST 12/2013:  AHI 4/hr (numerous episodes of airflow reduction that did not have concomitant desaturation)   ? ?Has the patient had major surgery during 100 days prior to admission? Yes ? ?Family History   ?family  history includes Heart disease in her maternal grandmother and mother; High blood pressure in her father; Obesity in her mother; Stroke in her father. ? ?Current Medications ? ?Current Facility-Administered Medications:  ?  0.9 %  sodium chloride infusion, 250 mL, Intravenous, Continuous, Joette Catching, [place] ?  0.9 %  sodium chloride infusion, , Intravenous, Continuous, Joette Catching, [place], Stopped at 09/19/21 2034 ?  Place/Maintain arterial line, , , Until Discontinued **AND** 0.9 %  sodium chloride infusion, , Intra-arterial, PRN, Joette Catching, PA-C ?  ALPRAZolam Duanne Moron) tablet 0.25 mg, 0.25 mg, Oral, TID PRN, Joette Catching, PA-C, 0.25 mg at 09/15/21 1650 ?  amiodarone (PACERONE) tablet 400 mg, 400 mg, Oral, BID, Larey Dresser, MD, 400 mg at 09/20/21 1275 ?  aspirin EC tablet 81 mg, 81 mg, Oral, Daily, Joette Catching, [place], 81 mg at 09/20/21 1700 ?  Chlorhexidine Gluconate Cloth 2 % PADS 6 each, 6 each, Topical, Daily, Joette Catching, [place], 6 each at 09/20/21 1749 ?  digoxin (LANOXIN) tablet 0.0625 mg, 0.0625 mg, Oral, Daily, Joette Catching, PA-C, 0.0625 mg at 09/20/21 0940 ?  feeding supplement (PIVOT 1.5 CAL) liquid 840 mL, 840 mL, Per Tube, Continuous, Larey Dresser, MD, Stopped at 09/20/21 [phone] ?  gabapentin (NEURONTIN) capsule 100 mg, 100 mg, Oral, TID PRN, Larey Dresser, MD ?  CBG monitoring, , , 4x Daily, AC & HS **AND** insulin aspart (novoLOG) injection 0-24 Units, 0-24 Units, Subcutaneous, TID WC & HS, Joette Catching, PA-C, 2 Units at 09/18/21 0940 ?  lactated ringers infusion, , Intravenous, Continuous, Joette Catching, PA-C ?  lactated ringers infusion, , Intravenous, Continuous, Joette Catching, [place], Stopped at 09/05/21 1010 ?  loperamide HCl (IMODIUM) 1 MG/7.5ML suspension 1 mg, 1 mg, Oral, TID PRN, Joette Catching, PA-C, 1 mg at 09/17/21 7591 ?  methimazole (TAPAZOLE) tablet 10 mg, 10 mg, Oral, Daily, Joette Catching, [place], 10 mg at 09/20/21 6384 ?  methimazole (TAPAZOLE) tablet 5 mg, 5 mg, Oral, QPM, Joette Catching, PA-C, 5 mg at 09/19/21 1627 ?  midodrine (PROAMATINE) tablet 5 mg, 5 mg, Oral, TID WC, Croitoru, Mihai, MD, 5 mg at 09/20/21 0941 ?  mirtazapine (REMERON) tablet 7.5 mg, 7.5 mg, Oral, QHS, Joette Catching, PA-C, 7.5 mg at 09/19/21 2216 ?  ondansetron [facility]) injection 4 mg, 4 mg, Intravenous, Q6H PRN, Joette Catching, PA-C ?  oxyCODONE (Oxy IR/ROXICODONE) immediate release tablet 5-10 mg, 5-10 mg, Oral, Q3H PRN, Joette Catching, PA-C, 5 mg at 09/20/21 6659 ?  pantoprazole (PROTONIX) EC tablet 40 mg, 40 mg, Oral, Daily, Joette Catching, [place], 40 mg at 09/20/21 9357 ?  predniSONE (DELTASONE) tablet 20 mg, 20 mg, Oral, Q breakfast, Joette Catching, [place], 20 mg at 09/20/21 0177 ?  quiniDINE gluconate  CR tablet 324 mg, 324 mg, Oral, BID, Joette Catching, [place], 324 mg at 09/19/21 2217 ?  rosuvastatin (CRESTOR) tablet 40 mg, 40 mg, Oral, QHS, Joette Catching, [place], 40 mg at 09/19/21 2216 ?  senna-docusate (Senokot-S) tablet 2 tablet, 2 tablet, Oral, BID PRN, Joette Catching, PA-C ?  sildenafil (REVATIO) tablet 20 mg, 20 mg, Oral, TID, Joette Catching, PA-C, 20 mg at 09/20/21 4825 ?  sodium chloride (OCEAN) 0.65 % nasal spray 1 spray, 1 spray, Each Nare, PRN, Joette Catching, PA-C, 1 spray at 09/19/21 0827 ?  sodium chloride flush (NS) 0.9 % injection 10-40 mL, 10-40 mL, Intracatheter, Q12H, Joette Catching, PA-C, 10 mL at 09/20/21 0037 ?  sodium chloride flush (NS) 0.9 % injection 10-40 mL, 10-40 mL, Intracatheter, PRN, Joette Catching, PA-C ?  sodium chloride flush (NS) 0.9 % injection 10-40 mL, 10-40 mL, Intracatheter, PRN, Joette Catching, PA-C ?  sodium chloride flush (NS) 0.9 % injection 3 mL, 3 mL, Intravenous, PRN, Joette Catching, PA-C ?  torsemide [facility]) tablet 80 mg, 80 mg, Oral, Daily, Larey Dresser,  MD, 80 mg at 09/20/21 0488 ?  traMADol (ULTRAM) tablet 50 mg, 50 mg, Oral, Q4H PRN, Joette Catching, PA-C, 50 mg at 09/11/21 0209 ?  warfarin (COUMADIN) tablet 3 mg, 3 mg, Oral, q1600, Larey Dresser, MD

## 2021-09-19 DIAGNOSIS — I509 Heart failure, unspecified: Secondary | ICD-10-CM | POA: Diagnosis not present

## 2021-09-19 DIAGNOSIS — R531 Weakness: Secondary | ICD-10-CM | POA: Diagnosis not present

## 2021-09-19 DIAGNOSIS — R638 Other symptoms and signs concerning food and fluid intake: Secondary | ICD-10-CM

## 2021-09-19 DIAGNOSIS — Z95811 Presence of heart assist device: Secondary | ICD-10-CM | POA: Diagnosis not present

## 2021-09-19 DIAGNOSIS — I5043 Acute on chronic combined systolic (congestive) and diastolic (congestive) heart failure: Secondary | ICD-10-CM | POA: Diagnosis not present

## 2021-09-19 LAB — BASIC METABOLIC PANEL
Anion gap: 8 (ref 5–15)
Anion gap: 8 (ref 5–15)
Anion gap: 6 (ref 5–15)
BUN: 45 mg/dL — ABNORMAL HIGH (ref 6–20)
BUN: 44 mg/dL — ABNORMAL HIGH (ref 6–20)
BUN: 38 mg/dL — ABNORMAL HIGH (ref 6–20)
CO2: 30 mmol/L (ref 22–32)
CO2: 31 mmol/L (ref 22–32)
CO2: 28 mmol/L (ref 22–32)
Calcium: 8.7 mg/dL — ABNORMAL LOW (ref 8.9–10.3)
Calcium: 8.5 mg/dL — ABNORMAL LOW (ref 8.9–10.3)
Calcium: 8.8 mg/dL — ABNORMAL LOW (ref 8.9–10.3)
Chloride: 105 mmol/L (ref 98–111)
Chloride: 104 mmol/L (ref 98–111)
Chloride: 106 mmol/L (ref 98–111)
Creatinine, Ser: 0.95 mg/dL (ref 0.44–1.00)
Creatinine, Ser: 0.95 mg/dL (ref 0.44–1.00)
Creatinine, Ser: 0.99 mg/dL (ref 0.44–1.00)
GFR, Estimated: >60 mL/min (ref >60)
GFR, Estimated: >60 mL/min (ref >60)
GFR, Estimated: >60 mL/min (ref >60)
Glucose, Bld: 127 mg/dL — ABNORMAL HIGH (ref 70–99)
Glucose, Bld: 89 mg/dL (ref 70–99)
Glucose, Bld: 92 mg/dL (ref 70–99)
Potassium: 2.9 mmol/L — ABNORMAL LOW (ref 3.5–5.1)
Potassium: 3 mmol/L — ABNORMAL LOW (ref 3.5–5.1)
Potassium: 5.6 mmol/L — ABNORMAL HIGH (ref 3.5–5.1)
Sodium: 143 mmol/L (ref 135–145)
Sodium: 143 mmol/L (ref 135–145)
Sodium: 140 mmol/L (ref 135–145)

## 2021-09-19 LAB — COOXEMETRY PANEL
Carboxyhemoglobin: 2.2 % — ABNORMAL HIGH (ref 0.5–1.5)
Methemoglobin: 1.3 % (ref 0.0–1.5)
O2 Saturation: 67.5 %
Total hemoglobin: 9.2 g/dL — ABNORMAL LOW (ref 12.0–16.0)

## 2021-09-19 LAB — CBC WITH DIFFERENTIAL/PLATELET
Abs Immature Granulocytes: 0.45 10*3/uL — ABNORMAL HIGH (ref 0.00–0.07)
Basophils Absolute: 0 10*3/uL (ref 0.0–0.1)
Basophils Relative: 0 %
Eosinophils Absolute: 0 10*3/uL (ref 0.0–0.5)
Eosinophils Relative: 0 %
HCT: 30.7 % — ABNORMAL LOW (ref 36.0–46.0)
Hemoglobin: 8.7 g/dL — ABNORMAL LOW (ref 12.0–15.0)
Immature Granulocytes: 3 %
Lymphocytes Relative: 12 %
Lymphs Abs: 1.9 10*3/uL (ref 0.7–4.0)
MCH: 28.2 pg (ref 26.0–34.0)
MCHC: 28.3 g/dL — ABNORMAL LOW (ref 30.0–36.0)
MCV: 99.4 fL (ref 80.0–100.0)
Monocytes Absolute: 1 10*3/uL (ref 0.1–1.0)
Monocytes Relative: 6 %
Neutro Abs: 12.4 10*3/uL — ABNORMAL HIGH (ref 1.7–7.7)
Neutrophils Relative %: 79 %
Platelets: 276 10*3/uL (ref 150–400)
RBC: 3.09 MIL/uL — ABNORMAL LOW (ref 3.87–5.11)
RDW: 23.5 % — ABNORMAL HIGH (ref 11.5–15.5)
WBC: 15.8 10*3/uL — ABNORMAL HIGH (ref 4.0–10.5)
nRBC: 1.4 % — ABNORMAL HIGH (ref 0.0–0.2)

## 2021-09-19 LAB — GLUCOSE, CAPILLARY
Glucose-Capillary: 102 mg/dL — ABNORMAL HIGH (ref 70–99)
Glucose-Capillary: 100 mg/dL — ABNORMAL HIGH (ref 70–99)
Glucose-Capillary: 96 mg/dL (ref 70–99)
Glucose-Capillary: 51 mg/dL — ABNORMAL LOW (ref 70–99)
Glucose-Capillary: 71 mg/dL (ref 70–99)
Glucose-Capillary: 109 mg/dL — ABNORMAL HIGH (ref 70–99)
Glucose-Capillary: 95 mg/dL (ref 70–99)

## 2021-09-19 LAB — PROTIME-INR
INR: 2.2 — ABNORMAL HIGH (ref 0.8–1.2)
Prothrombin Time: 24.4 seconds — ABNORMAL HIGH (ref 11.4–15.2)

## 2021-09-19 LAB — LACTATE DEHYDROGENASE: LDH: 490 U/L — ABNORMAL HIGH (ref 98–192)

## 2021-09-19 LAB — MAGNESIUM: Magnesium: 2.4 mg/dL (ref 1.7–2.4)

## 2021-09-19 MED ORDER — POTASSIUM CHLORIDE 20 MEQ PO PACK
40.0000 meq | PACK | Freq: Once | ORAL | Status: DC
  Filled 2021-09-19: qty 2

## 2021-09-19 MED ORDER — WARFARIN SODIUM 3 MG PO TABS
3.5000 mg | ORAL_TABLET | Freq: Once | ORAL | Status: AC
  Administered 2021-09-19: 3.5 mg via ORAL
  Filled 2021-09-19: qty 1

## 2021-09-19 MED ORDER — AMIODARONE HCL 200 MG PO TABS
400.0000 mg | ORAL_TABLET | Freq: Two times a day (BID) | ORAL | Status: DC
  Administered 2021-09-19 – 2021-09-20 (×3): 400 mg via ORAL
  Filled 2021-09-19 (×3): qty 2

## 2021-09-19 MED ORDER — POTASSIUM CHLORIDE 20 MEQ PO PACK
60.0000 meq | PACK | ORAL | Status: AC
  Administered 2021-09-19 – 2021-09-20 (×3): 60 meq via ORAL
  Filled 2021-09-19 (×3): qty 3

## 2021-09-19 MED ORDER — POTASSIUM CHLORIDE 20 MEQ PO PACK: 40.0000 meq | PACK | Freq: Two times a day (BID) | ORAL | Status: DC

## 2021-09-19 MED ORDER — GABAPENTIN 100 MG PO CAPS: 100.0000 mg | ORAL_CAPSULE | Freq: Three times a day (TID) | ORAL | Status: DC | PRN

## 2021-09-19 MED ORDER — TORSEMIDE 20 MG PO TABS
60.0000 mg | ORAL_TABLET | Freq: Every day | ORAL | Status: DC
Start: 2021-09-19 — End: 2021-09-20
  Administered 2021-09-19: 60 mg via ORAL
  Filled 2021-09-19: qty 3

## 2021-09-19 MED ORDER — POTASSIUM CHLORIDE 10 MEQ/50ML IV SOLN
10.0000 meq | INTRAVENOUS | Status: AC
  Administered 2021-09-19 (×5): 10 meq via INTRAVENOUS
  Filled 2021-09-19 (×5): qty 50

## 2021-09-19 MED ORDER — POTASSIUM CHLORIDE 10 MEQ/100ML IV SOLN
10.0000 meq | Freq: Once | INTRAVENOUS | Status: AC
  Administered 2021-09-19: 10 meq via INTRAVENOUS
  Filled 2021-09-19: qty 100

## 2021-09-19 NOTE — Progress Notes (Signed)
? ? ?  Progress Note from the Palliative Medicine Team at [facility] ? ?Patient ID: [name], female   DOB: April 11, 1971, 51 y.o.   MRN: [id number] ? ? ?Patient Name: [name]        ?Date: 09/19/2021 ?DOB: April 28, 1971  Age: 51 y.o. MRN#: [id number] ?Attending Physician: Sanda Klein, MD ?Primary Care Physician: Patient, No Pcp Per (Inactive) ?Admit Date: 08/25/2021 ? ? ?Medical records reviewed  ? ?51 y.o. female   admitted on 08/25/2021 with  ?[name] is a 51 y.o. 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 ?  ? S/p LVAD implantation 09-03-21, progressing slowly.   Plan is for CIR tomorrow.  ? ? ?This NP visited patient at the bedside as a follow up for palliative medicine needs and emotional support.   Husband  at bedside. ? ?Patient is alert and oriented, and feeling a better every day.  Husband continues develop confidence  in  his caregiver role.  ? ?Patient continues to struggle with po intake.  Education offered on grazing strategy and  high nutritional foods.   ? ?Education offered on the next steps in transition of care at it relates to treatment plan, transition to CIR and anticipatory care needs.  ? ?Emotional support offered ? ?Questions and concerns addressed   PMT will continue to support holistically  ? ?Wadie Lessen NP  ?Palliative Medicine Team Team Phone # [phone] ?Pager [phone] ?  ?

## 2021-09-19 NOTE — Progress Notes (Signed)
Nutrition Follow-up ? ?DOCUMENTATION CODES:  ? ?Obesity unspecified, Non-severe (moderate) malnutrition in context of chronic illness ? ?INTERVENTION:  ? ?Continue nocturnal tube feeds via Cortrak: ?- Pivot 1.5 @ 70 ml/hr x 12 hours from 1800 to 0600 (total of 840 ml) ? ?Nocturnal tube feeding regimen provides 1260 kcal, 79 grams of protein, and 630 ml of H2O (meets 70% of minimum kcal needs and 79% of minimum protein needs). ? ?- Continue Regular diet order ? ?NUTRITION DIAGNOSIS:  ? ?Moderate Malnutrition related to chronic illness as evidenced by moderate muscle depletion, mild fat depletion, energy intake < 75% for > or equal to 1 month. ? ?Ongoing, being addressed via TF ? ?GOAL:  ? ?Patient will meet greater than or equal to 90% of their needs ? ?Progressing ? ?MONITOR:  ? ?PO intake, Supplement acceptance, Labs, Weight trends ? ?REASON FOR ASSESSMENT:  ? ?Consult ?LVAD Eval ? ?ASSESSMENT:  ? ?51 yo female admitted with acute on chronic CHF. PMH includes NICM EF 20-25%, CKD 3, CVA, HTN, HLD. ? ?03/08 - RHC revealing low cardiac output with elevated right/left filling pressure ?03/09 - s/p Impella 5.5 placement ?03/10 - Cortrak placed (tip in distal stomach) ?03/14 - s/p LVAD, Cortrak came out during surgery ?03/15 - extubated ?03/24 - Cortrak replaced, TF restarted ?03/28 - transferred out of ICU ?03/29 - TF changed to nocturnal ? ?Pt transitioned to nocturnal tube feeds yesterday. Cortrak remains in place. ? ?Spoke with pt and husband at bedside. Pt in good spirits today and reports appetite continues to improve. Pt reports tolerating nocturnal tube feeds without issue and is in agreement with continuing this regimen. ? ?Pt continues to keep her own record of PO intake. Pt had a snack yesterday afternoon of honey nut cheerios (brought in by husband) and whole milk. She completed 100% of this. For dinner, pt consumed 75% of fresh fruit, 50% of chef salad, and 100% of fruit ice. For breakfast this morning, pt  consumed 100% of a bowl of cheerios with whole milk, 50% of a [place] strawberry yogurt, and 100% of a banana. Pt is pleased with her progress. ? ?Pt continues to decline any oral nutrition supplements at this time. She reports that they cause her GI distress. ? ?Noted plan for pt to d/c to CIR tomorrow. ? ?Admit weight: 104.5 kg ?Current weight: 100.2 kg ? ?Medications reviewed and include: SSI, remeron 7.5 mg daily, protonix, klor-con 40 mEq once, klor-con 40 mEq BID, prednisone, torsemide, warfarin, IV KCl 10 mEq x 6 runs ? ?Labs reviewed: potassium 2.9, BUN 45, WBC 15.8, hemoglobin 8.7 ?CBG's: 93-124 x 24 hours ? ?UOP: 2000 ml x 24 hours ?I/O's: -27.8 L since admit ? ?Diet Order:   ?Diet Order   ? ?       ?  Diet regular Room service appropriate? Yes; Fluid consistency: Thin  Diet effective now       ?  ? ?  ?  ? ?  ? ? ?EDUCATION NEEDS:  ? ?Education needs have been addressed ? ?Skin:  Skin Assessment: ?Skin Integrity Issues: ?Incisions: closed incision to chest ?Other: new LVAD, driveline ? ?Last BM:  09/18/21 type 5 ? ?Height:  ? ?Ht Readings from Last 1 Encounters:  ?09/12/21 '5\' 3"'$  (1.6 m)  ? ? ?Weight:  ? ?Wt Readings from Last 1 Encounters:  ?09/19/21 100.2 kg  ? ? ?BMI:  Body mass index is 39.13 kg/m?. ? ?Estimated Nutritional Needs:  ? ?Kcal:  1800-2000 kcals ? ?Protein:  100-115  grams ? ?Fluid:  2L ? ? ? ?Gustavus Bryant, MS, RD, LDN ?Inpatient Clinical Dietitian ?Please see AMiON for contact information. ? ?

## 2021-09-19 NOTE — Progress Notes (Addendum)
Inpatient Rehabilitation Admissions Coordinator  ? ?I met with patient and her spouse at bedside. I have insurance approval and plan admit to CIR on Friday.I reviewed cost of care benefits letter. They are in agreement. We will follow up tomorrow. ? ?Danne Baxter, RN, MSN ?Rehab Admissions Coordinator ?[phone] ?09/19/2021 10:46 AM ? ?

## 2021-09-19 NOTE — Progress Notes (Signed)
Critical result  ?Cbg read 51 on first evening check pt given juice ?Will recheck and start tube feedings  ? ?

## 2021-09-19 NOTE — Progress Notes (Signed)
This chaplain is present for F/U spiritual care with the Pt. and Pt. husband-Jamie. ? ?The chaplain opened the space for the Pt. and Jamie to reflect on the previous two days and the anticipated move to CIR. The Pt. is celebrating the increase in her appetite, "two pieces of pizza and ice cream". [name] shares he was welcomed into the LVAD support group and looks forwarding to sharing his experiences with others. ? ?The chaplain learned the Pt. is looking forward to the daily rehab schedule in CIR. [name] is interested in finding out more about the times he may need to be present. The Pt. explored mindful meditative activities with the chaplain to finish her day of therapy. The chaplain understands the Pt. remains focused on her faith and prayer as she takes the next steps toward healing. ? ?This chaplain prayed with the Pt. and will F/U on Monday. ? ?Chaplain Sallyanne Kuster ?[phone] ?

## 2021-09-19 NOTE — Progress Notes (Signed)
Unna boots were to be changed today but were not per patient.  She requested they be removed tonight and then replaced if needed tomorrow.  Unna boots were removed and skin was cleansed and dried.  ?

## 2021-09-19 NOTE — Progress Notes (Signed)
CARDIAC REHAB PHASE I  ? ?PRE:  Rate/Rhythm: 85 SR ? ?  BP: sitting 86V systolic by aline ? ?  SaO2: wouldn't register ? ?MODE:  Ambulation: 340 ft  ? ?POST:  Rate/Rhythm: 95 SR ? ?  BP: sitting not calibrated  ? ?  SaO2: wouldn't register ? ?Pt eager to ambulate. Husband managed battery change. Pt stood with rocking. Slow and steady pace with RW, no major c/o. Return to recliner. Discussed IS/flutter use, x3 walks qd, sternal precautions, and CRPII. Will refer to [place] for "down the road", hopefully in 2-3 months. ?1330-1420  ? ?Yves Dill CES, ACSM ?09/19/2021 ?2:17 PM ? ? ? ? ?

## 2021-09-19 NOTE — Progress Notes (Signed)
LVAD Coordinator Rounding Note: ? ?Admitted 08/26/21 due to acute on chronic systolic heart failure. Receiving ongoing treatment for hyperthyroidism with Methimazole and steroids. On 08/29/21 Impella 5.5 placed.  ? ?HM III LVAD implanted on 09/03/21 by Dr Prescott Gum under destination therapy criteria due to elevated BMI.  ? ?Pt sitting up in the bed. Unable to obtain acurrate automatic cuff pressures or doppler pressures. Pt will need to keep the arterial line for now. ? ?WBC 15.8 today; afebrile. Completed Cefepime for positive urine culture. She remains on methimazole and PO Prednisone for hyperthyroidism. Will need endocrinology f/u appt at discharge.  ? ?Pts home equipment is in house and checked in. ? ?Education completed with pt and her husband [name]. [name] and [place] practiced paging the [place] pager today. [name] performed VAD dressing today with very few cues from VAD coordinator, will most likely check him off tomorrow for him to perform independently. ? ?RAMP echo performed yesterday and pts speed increased to 5800. ? ?VAD coordinator removed CT sutures today per Dr Prescott Gum ? ?Pt will be d/c to CIR tomorrow per CIR team. ? ?Vital signs: ?Temp: 98.6 ?HR: 80 NSR ?Doppler Pressure: not accurate ?Arterial BP: 76/59 (65) ?O2 Sat: 95% on RA  ?Wt: 243.2>244.7>248>242.6>242.5>241.2>238.1>219.8>220.9  lbs ? ?LVAD interrogation:  ?Speed: 5800 ?Flow: 5.0 ?Power: 4.6 w ?PI: 3.1 ? ?Alarms: none ?Events: 4 PI today ? ?Hematocrit: 30 ?Fixed speed: 5800 ?Low speed limit: 5500 ? ?Drive Line: Existing VAD dressing removed and site care performed using sterile technique by pts husband Jamie-observed by VAD coordinator. Drive line exit site cleaned with Chlora prep applicators x 2, RINSED WITH SALINE, allowed to dry, and gauze dressing with Silver strip applied. Exit site healing and unincorporated, the velour is fully implanted at exit site. 2 sutures intact. Large amount of serosanguinous drainage. No redness, tenderness, foul odor  or rash noted. Drive line anchor secure. Pt will need daily dressing changes using daily kit per VAD coordinator or nurse champion. Next dressing change due: 09/20/21.  ? ? ? ? ? ?Labs:  ?LDH trend: 391>485>519>534>530>543>621 ? ?INR trend: 0.8>1.6>2.9>2.2>2.3>2.0>1.8 ? ?WBC trend: 25.1>29.5>30.7>32.4>25.8>27.1>24.1 ? ?Hgb trend: 6.5>7.5>8.0>9.6>9.5>9.1>8.9>9.4 ? ?Anticoagulation Plan: ?-INR Goal: 2.0 - 2.5 ?-ASA Dose: 81 mg  ? ?Blood Products:  ?IntraOp: ? 4 PRBCs ? 4 FFP ? 2 Cryo ? 2 platelets ?DDAVP ? 1,400 cell saver ?Post Op: ?09/03/21: Factor 7 ? 1 PRBC ? 1 Platelet ?09/04/21: 1 PRBC ?09/05/21: 1 PRBC ? ?Device: turned back on 3/29 ?-Medtronic  ?-Therapies: VF on >240 BPM ?        VT on 200-240 BPM ? 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 ?Respiratory: extubated 09/04/21  ? ?Infection: ?08/31/21>> blood cultures>> no growth 5 days; final ?09/04/21>> blood cultures>> no growth 5 days; final ?09/04/21>> sputum culture>> moderate WBC, no organisms seen; rare candida albicans ?09/04/21>> urine culture>> enterococcus faecalis; final ? ?Drips: ?Amiodarone 60 mg/hr-off today ?Lasix 12 mg/hr-off ? ?Patient Education: ?Husband performed dressing change today. VAD coordinator provided very few cues throughout procedure. ?All education completed with exception of completed. Pt and husband practiced paging the VAD pager today. ? ?Plan/Recommendations:  ?1. Page VAD coordinators with VAD equipment and drive line concerns. ?2. Daily drive line dressing change per VAD coordinator or nurse champion using daily kit. ? ?Tanda Rockers RN ?VAD Coordinator  ?Office: [phone]  ?24/7 Pager: [phone]  ? ? ?  ?

## 2021-09-19 NOTE — Discharge Summary (Addendum)
Advanced Heart Failure Team ? ?Discharge Summary  ? ?Patient ID: [name] ?MRN: [id number], DOB/AGE: 03/01/1971 51 y.o. Admit date: 08/25/2021 ?D/C date:     09/20/2021  ? ?Primary Discharge Diagnoses:  ?Acute on chronic systolic CHF ?Nonischemic cardiomyopathy ?HM III LVAD implant  ?Acute hypoxic respiratory failure ?Morbid obesity ?Hyperthyroidism ?VT/PVCs ?Paroxysmal atrial fibrillation ?CVA ?Anemia ?AKI on CKD II ?Mitral regurgitation ?Left ovarian mass ?Leukocytosis ?Malnutrition- Cortrak in place at d/c for nocturnal feeds ? ? ?Hospital Course:  ? 51 y.o. 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  ? ?Admitted on 08/26/21 with low-output HF in setting of hyperthyroidism. D/t recurrent admissions and inability to tolerate GDMT had reached point of requiring advanced therapies. BMI too high for transplant. Impella 5.5 placed on 03/09 as bridge to VAD. Pre-VAD she was seen by internal medicine service for hyperthyroidism and started on steroids and methimazole. GYN evaluated her for left ovarian mass.  Underwent placement of HM III LVAD by Dr. Darcey Clark on 09/03/21. Inotropes and pressors weaned off as tolerated. Extubated on 03/15 and received total of 4 u RBCs for blood loss anemia during hospital course. Had VT in pre-op and post-op period, paced out of VT on 03/16. Continued on amiodarone and quinidine. Diuresed with IV lasix which was eventually switched to po Torsemide. GDMT limited by soft BP.  Had difficulty with recurrent AF with RVR in setting of hypokalemia from aggressive diuresis.  ? ?She was followed by PT/OT during hospital stay. Acute inpatient rehab recommended at discharge. She was approved for CIR and discharged to the unit on 09/20/21 . VAD Team will continue to follow daily on CIR. See below for detailed problem list.  ? ?Hospital course by problem: ?1. Acute on chronic  systolic HF: Nonischemic cardiomyopathy.  She has a [employer] ICD. Cause for cardiomyopathy is uncertain, initially identified peri-partum (after son's birth), so cannot rule out peri-partum CMP.  On and off, she has had frequent PVCs.   She was admitted with volume overload, NYHA class IV symptoms and low output. Echo this admission with EF < 20% with severe dilation, normal RV size with mildly decreased systolic function, moderate-severe functional MR with dilated IVC. HCO3 18 and creatinine 1.5 on admit, co-ox 47%. Milrinone 0.25 started + lasix gtt.  I also suspect hyperthyroidism is playing a role in her CHF exacerbation. 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 Impella 5.5 placed 3/9.  Had HM3 LVAD placed 3/15. 3/15 Speed increased to 5300 rpm. 3/19 Speed increased to 5400. Pressors inotropes gradually weaned off. Ramp echo 3/22, fixed speed increased to 5600.  Ramp echo 3/29, speed increased to 5800.   ? - On po Torsemide 60 mg daily ?- Off spiro with soft BP/hyperkalemia  ?- Continue Digoxin 0.0625 mg  ?- Continue asa 81 mg daily. Will need long-term with h/o CVA ?- Continue sildenafil 20 mg tid  ?- Warfarin for VAD. INR 2.4. Dosing per pharmD.  ?- LDH stable 450  ?- Continue midodrine 5 mg TID ?2. Acute hypoxemic respiratory failure: Intubated post-op. Extubated, stable on RA. Continue aggressive pulmonary toilet.  ?- Ambulate.   ?3. Morbid obesity: BMI out of range for heart transplant.  ?4. 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  She has been on Levoxyl at home but  does not think she has taken for a month.  Now hyperthyroid.  This likely contributes to her CHF exacerbation. [facility] evaluated her for this. 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  ?- Solumedrol 20 mg IV daily -> transitioned to prednisone 20 on 3/16 and methimazole  bid.  ?- per IM, continue prednisone 20 mg x 1 month, then reduce down to 10 mg until outpatient endocrinology follow-up.   ?- Continuing amiodarone for now with NSVT and rapid afib. See below. ?5. PVCs/NSVT: EP consulted given complicated situation with hyperthyroidism and NSVT. They favored continuing amiodarone and quinidine at this time.   ?- Paced out of VT on 03/16 ?- Off inotrope and pressors. Would eventually like to stop amio given hyperthyroidism.  ?- IV amio switched to po amiodarone on 03/30 ?6. CVA: CVA in 10/21, had thrombectomy with stent placement.  No LV thrombus noted and no atrial fibrillation noted. . ?- Talked with interventional radiology. >1 year post-stent, ok to stop ticagrelor and continue ASA.  ?7. Anemia: Post-op with chest tube output.  3/15, 3/16 1UPRBCs. Hgb stable at 9.2  ?- Transfuse hgb < 8.  ?8. AKI on CKD stage II: Renal function stable, creatinine 1.0.  ?9. Mitral regurgitation: Moderate-severe functional MR.  Unlikely to be Mitraclip candidate with severely dilated LV (>7 cm). Hopefully will improve with LVAD.  ?10. 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  Unable to get MRI with ICD. Seen by GYN. Felt to be most likely non-neoplastic. Tumor markers within normal range. Possible low grade malignancy, could also be benign.  No ascites noted on abdominal CT and no mention of abdominal spread.  She is not a surgical candidate at this time with low output HF.  ?- Plan at this point would be to stabilize with LVAD then investigate the ovarian mass.  ?- F/u with GYN as outpatient ?11. Elevated WBCs: Suspect due to steroids, no evidence for infection. Urine 50,000 Gram + cocci. Final cx with enterococcus faecalis. Likely colonized.  Completed vancomycin+ cefazolin. Also completed course of empiric cefepime 03/22.  ?12. Malnutrition: Prealbumin 13 ?- Remeron added to stimulate appetite  ?- Now with nocturnal  TFs via CorTrak. Continue hopefully can d/c soon.  ?13. Hypokalemia: Recurrent issue during admit. Needed aggressive repletion with diuresis.   ?- Not absorbing pills well, need IV and powder. K 5.4 today  ?14. Atrial fibrillation: Tolerates poorly.  Now maintaining SR. ?- On po amiodarone 400 mg bid.  Would like to avoid long-term amio with hyperthyroidism. ?- continue digoxin 0.0625 ?- Keep K > 4 and mag > 2 ?  ? ?Discharge Weight: 221 pounds  ? ?Discharge Vitals: Blood pressure (!) 88/69, pulse 87, temperature 97.6 ?F (36.4 ?C), temperature source Oral, resp. rate 16, height '5\' 3"'$  (1.6 m), weight 100.3 kg, SpO2 96 %. ? ?Labs: ?Lab Results  ?Component Value Date  ? WBC 16.1 (H) 09/20/2021  ? HGB 9.2 (L) 09/20/2021  ? HCT 31.3 (L) 09/20/2021  ? MCV 97.2 09/20/2021  ? PLT 271 09/20/2021  ?  ?Recent Labs  ?Lab 09/20/21 ?0259  ?NA 140  ?K 5.4*  ?CL 107  ?CO2 28  ?BUN 40*  ?CREATININE 1.00  ?CALCIUM 9.1  ?GLUCOSE 110*  ? ?Lab Results  ?Component Value Date  ? CHOL 100 08/26/2021  ? HDL 26 (L) 08/26/2021  ? LDLCALC 55 08/26/2021  ? TRIG 95 08/26/2021  ? ?BNP (  last 3 results) ?Recent Labs  ?  09/04/21 ?1610 09/10/21 ?9604 09/17/21 ?0140  ?BNP 358.8* 521.3* 279.4*  ? ? ?ProBNP (last 3 results) ?No results for input(s): PROBNP in the last 8760 hours. ? ? ?Diagnostic Studies/Procedures  ? ?ECHOCARDIOGRAM LIMITED ? ?Result Date: 09/18/2021 ?   ECHOCARDIOGRAM LIMITED REPORT   Patient Name:   [name] Date of Exam: 09/18/2021 Medical Rec #:  [id number]    Height:       63.0 in Accession #:    [id number]   Weight:       219.8 lb Date of Birth:  01-21-1971    BSA:          2.013 m? Patient Age:    53 years     BP:           0/0 mmHg Patient Gender: F            HR:           61 bpm. Exam Location:  Inpatient Procedure: Limited Echo, Color Doppler and Cardiac Doppler Indications:    LVAD RAMP  History:        Patient has prior history of Echocardiogram examinations, most                 recent 09/11/2021. CHF, Defibrillator;  Pulmonary HTN.  Sonographer:    Raquel Sarna Senior RDCS Referring Phys: [phone] [place]  1. 5600 RPM     The septum is: neutral with leftward septal shift with diastole     There is no suck down.

## 2021-09-19 NOTE — Progress Notes (Signed)
Patient ID: [name], female   DOB: Apr 20, 1971, 51 y.o.   MRN: [id number] ?HeartMate 2 Rounding Note ? ?Subjective:   ? ?- 3/14: HM3 LVAD placement with removal of Impella 5.5  ?- 3/15: Speed increased to 5300 rpm. Given 1UPRBC. Febrile. Cultures obtained. Extubated.  ?- 3/16 VT paced out. On amio drip. Given 1UPRBCs . Given couple doses of IV lasix.  ?- 3/18 Rapid ALF. Bolused multiple times with amio and drip turned up to 60. Now back in NSR ?- 3/19 VAD Speed increased to 5400 (high PI)  ?- 3/20 Recurrent ALF. Bolused w/ amio. Milrinone discontinued ?- 3/22 Ramp echo, Fixed Speed 5600  ?- 3/28  Transferred out of CCU ?- 3/29 Ramp echo, fixed speed increased to 5800 ? ?Co-ox 67.5%.  I/Os mildly negative, weight stable.  CVP still 13-14.  ? ?Remains in NSR on amiodarone 30 mg/hr.  ? ?MAPs upper 60s-70s ? ?Scr stable at 0.95.   ? ?K 2.9>>getting IV K supp  ? ?WBC continues to trend down, 27>>24>>17>>15.8K. Afebrile. Remains on prednisone.  ? ?Getting nocturnal TFs via CorTrak. Feels appetite is picking up.  ? ?Feeling better. No dyspnea.  ? ?LVAD INTERROGATION:  ?HeartMate II LVAD:  Flow 4.9, speed 5800, power 4.6, PI 3.4. 4 PI events. VAD interrogated personally. Parameters stable. ? ? ?Objective:   ? ?Vital Signs:   ?Temp:  [97.7 ?F (36.5 ?C)-98.6 ?F (37 ?C)] 98.6 ?F (37 ?C) (03/30 0340) ?Pulse Rate:  [69-84] 69 (03/30 0340) ?Resp:  [17-22] 19 (03/30 0340) ?BP: (74-101)/(57-69) 74/59 (03/30 0340) ?SpO2:  [93 %-95 %] 93 % (03/30 0340) ?Arterial Line BP: (73-79)/(59-63) 76/59 (03/30 0340) ?Weight:  [100.2 kg] 100.2 kg (03/30 0451) ?Last BM Date : 09/17/21 ?Mean arterial Pressure  upper 60s-70s  ? ?Intake/Output:  ? ?Intake/Output Summary (Last 24 hours) at 09/19/2021 0748 ?Last data filed at 09/19/2021 0540 ?Gross per 24 hour  ?Intake 1857.51 ml  ?Output 2000 ml  ?Net -142.49 ml  ?  ?Physical Exam: ?CVP 13-14  ?General: Well appearing this am. NAD.  ?HEENT: Normal. ?Neck: Supple, JVP 12 cm. Carotids OK.  ?Cardiac:   Mechanical heart sounds with LVAD hum present.  ?Lungs:  CTAB, normal effort.  ?Abdomen:  NT, ND, no HSM. No bruits or masses. +BS  ?LVAD exit site: Well-healed and incorporated. Dressing dry and intact. No erythema or drainage. Stabilization device present and accurately applied. Driveline dressing changed daily per sterile technique. ?Extremities:  Warm and dry. No cyanosis, clubbing, rash. 1+ edema 1/2 to knees.  ?Neuro:  Alert & oriented x 3. Cranial nerves grossly intact. Moves all 4 extremities w/o difficulty. Affect pleasant     ? ?Telemetry: NSR 70s ? ?Labs: ?Basic Metabolic Panel: ?Recent Labs  ?Lab 09/13/21 ?1436 09/14/21 ?0204 09/14/21 ?0431 09/14/21 ?3428 09/14/21 ?1629 09/15/21 ?0320 09/15/21 ?1429 09/16/21 ?0500 09/16/21 ?1430 09/17/21 ?0140 09/17/21 ?1435 09/18/21 ?0357 09/18/21 ?1418 09/19/21 ?7681  ?NA 137   < > 137   < > 136 137   < > 137   < > 138 140 141 140 143  ?K 3.7  3.8   < > 4.0   < > 3.4* 2.6*   < > 2.2*   < > 3.3* 5.4* 2.7* 4.9 2.9*  ?CL 91*   < > 91*   < > 88* 85*   < > 84*   < > 92* 99 97* 102 105  ?CO2 35*   < > 38*   < > 38* 41*   < >  42*   < > 38* 32 35* 31 30  ?GLUCOSE 110*   < > 119*   < > 141* 122*   < > 113*   < > 103* 155* 111* 188* 127*  ?BUN 38*   < > 39*   < > 43* 44*   < > 55*   < > 58* 48* 49* 43* 45*  ?CREATININE 1.33*   < > 1.28*   < > 1.23* 1.22*   < > 1.16*   < > 1.04* 1.01* 1.01* 0.94 0.95  ?CALCIUM 9.3   < > 9.2   < > 9.1 9.1   < > 8.9   < > 9.0 8.4* 8.7* 8.5* 8.7*  ?MG 2.0  --  1.9  --  2.6* 2.2  --  2.0  --  2.2  --  2.3  --  2.4  ?PHOS 3.5  --  2.9  --  3.1  --   --   --   --   --   --   --   --   --   ? < > = values in this interval not displayed.  ? ? ?Liver Function Tests: ?No results for input(s): AST, ALT, ALKPHOS, BILITOT, PROT, ALBUMIN in the last 168 hours. ? ?No results for input(s): LIPASE, AMYLASE in the last 168 hours. ?No results for input(s): AMMONIA in the last 168 hours. ? ?CBC: ?Recent Labs  ?Lab 09/15/21 ?0320 09/16/21 ?0500 09/17/21 ?0140  09/18/21 ?0357 09/19/21 ?0355  ?WBC 30.1* 27.1* 24.1* 17.9* 15.8*  ?NEUTROABS 26.0* 23.2* 20.0* 14.2* 12.4*  ?HGB 9.0* 8.9* 9.4* 8.6* 8.7*  ?HCT 29.5* 29.0* 32.0* 29.6* 30.7*  ?MCV 93.1 93.2 95.8 96.7 99.4  ?PLT 413* 395 355 299 276  ? ? ?INR: ?Recent Labs  ?Lab 09/15/21 ?0320 09/16/21 ?0500 09/17/21 ?0140 09/18/21 ?0357 09/19/21 ?9741  ?INR 2.3* 2.0* 1.8* 2.0* 2.2*  ? ? ?Other results: ? ? ?Imaging: ?DG Chest Port 1 View ? ?Result Date: 09/18/2021 ?CLINICAL DATA:  LVAD. EXAM: PORTABLE CHEST 1 VIEW COMPARISON:  Chest radiograph 09/15/2021 FINDINGS: Patient has an LVAD and left chest ICD. Stable cardiomegaly. Feeding tube extends in the abdomen and the tip is incompletely visualized. Right arm PICC line tip terminates in the right atrium region. Patchy densities in the right lower lung are minimally changed. Slightly improved aeration in the lungs compared to the recent comparison examination. IMPRESSION: 1. Slightly improved aeration in the lungs with persistent densities in the right lower chest. These densities are nonspecific but may be related to atelectasis. 2. Support apparatuses as described. 3. Cardiomegaly. Electronically Signed   By: Markus Daft M.D.   On: 09/18/2021 08:21  ? ?ECHOCARDIOGRAM LIMITED ? ?Result Date: 09/18/2021 ?   ECHOCARDIOGRAM LIMITED REPORT   Patient Name:   [name] Date of Exam: 09/18/2021 Medical Rec #:  [id number]    Height:       63.0 in Accession #:    [id number]   Weight:       219.8 lb Date of Birth:  11-24-1970    BSA:          2.013 m? Patient Age:    82 years     BP:           0/0 mmHg Patient Gender: F            HR:           61 bpm. Exam Location:  Inpatient Procedure: Limited Echo, Color  Doppler and Cardiac Doppler Indications:    LVAD RAMP  History:        Patient has prior history of Echocardiogram examinations, most                 recent 09/11/2021. CHF, Defibrillator; Pulmonary HTN.  Sonographer:    Raquel Sarna Senior RDCS Referring Phys: [phone] [place]  1.  5600 RPM     The septum is: neutral with leftward septal shift with diastole     There is no suck down.     Right ventricle is mildly dilated with mildly reduced systolic function.     The aortic valve: opens every cycle     Aortic regurgitation: mild-moderate     Mitral regurgitation: mild     Tricuspid regurgitation: moderate-severe         Inflow cannula is not visualized.     Outflow cannula is not visualized.         LVAD ramp study:     5700 RPM     The septum is: neutral with leftward septal shift with diastole     There is no suck down.     Right ventricle is mildly dilated with mildly reduced systolic function.     The aortic valve: opens every cycle     Aortic regurgitation: mild-moderate     Mitral regurgitation: mild     Tricuspid regurgitation: moderate-severe         5800 RPM     The septum is: neutral with reduced leftward septal shift in diastole.     There is no suck down.     Right ventricle is mildly dilated with mildly reduced systolic function.     The aortic valve: opens every cycle     Aortic regurgitation: mild-moderate     Mitral regurgitation: mild     Tricuspid regurgitation: mild-moderate. FINDINGS  Left Ventricular Assist Device: 9675 RPM The septum is: neutral with leftward septal shift with diastole There is no suck down. Right ventricle is mildly dilated with mildly reduced systolic function. The aortic valve: opens every cycle Aortic regurgitation: mild-moderate Mitral regurgitation: mild Tricuspid regurgitation: moderate-severe Inflow cannula is not visualized. Outflow cannula is not visualized. LVAD ramp study: 5700 RPM The septum is: neutral with leftward septal shift with diastole There is no suck down. Right ventricle is mildly dilated with mildly reduced systolic function. The aortic valve: opens every cycle Aortic regurgitation: mild-moderate Mitral regurgitation: mild Tricuspid regurgitation: moderate-severe  5800 RPM The septum is: neutral with reduced leftward septal shift  in diastole. There is no suck down. Right ventricle is mildly dilated with mildly reduced systolic function. The aortic valve: opens every cycle Aortic regurgitation: mild-moderate Mitral regurgitation: mild Tr

## 2021-09-19 NOTE — Progress Notes (Signed)
Mobility Specialist: Progress Note ? ? 09/19/21 1144  ?Mobility  ?Activity Ambulated with assistance in hallway  ?Level of Assistance Moderate assist, patient does 50-74%  ?Assistive Device Front wheel walker  ?Distance Ambulated (ft) 340 ft  ?Activity Response Tolerated well  ?$Mobility charge 1 Mobility  ? ?Pt received in bed and agreeable to ambulation. Pt required modA to sit EOB as well as to stand after x2 rocking attempts. No c/o throughout ambulation. Pt to recliner after session with call [place] in reach. RN present in the room.  ? ?Harrell Gave Tommy [place] ?Mobility Specialist ?Mobility Specialist [place]: [phone] ?Mobility Specialist [place]: [phone] ? ?

## 2021-09-19 NOTE — Progress Notes (Signed)
Physical Therapy Treatment ?Patient Details ?Name: [name] ?MRN: [id number] ?DOB: [birth year] ?Today's Date: 09/19/2021 ? ? ?History of Present Illness 51yo female adm 3/6 with acute on chronic heart failure exacerbation. Impella placed  3/9. LVAD HM 3 implanted 09/03/21. 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 ? ?  ?PT Comments  ? ? Focused session on gait training with a RW and without UE support along with transfer training. Pt continues to display balance deficits, resulting in pt requiring minA to ambulate without UE support, displaying x1 LOB laterally requiring modA to recover. She benefits from using the RW at this time for improved stability. Performed x10 mini squats followed by x10 deep squats to improve technique and ease with transfers sit > stand as this is a primary area of difficulty for the pt. However, pt very fatigued after these reps and unable to complete a full sit > stand transfer from the recliner, even with maxA. Will continue to follow acutely. Current recommendations remain appropriate. ?  ?Recommendations for follow up therapy are one component of a multi-disciplinary discharge planning process, led by the attending physician.  Recommendations may be updated based on patient status, additional functional criteria and insurance authorization. ? ?Follow Up Recommendations ? Acute inpatient rehab (3hours/day) ?  ?  ?Assistance Recommended at Discharge Frequent or constant Supervision/Assistance  ?Patient can return home with the following Assistance with cooking/housework;Assist for transportation;Help with stairs or ramp for entrance;A little help with walking and/or transfers;A little help with bathing/dressing/bathroom ?  ?Equipment Recommendations ? Rollator (4 wheels);BSC/3in1  ?  ?Recommendations for Other Services   ? ? ?  ?Precautions / Restrictions Precautions ?Precautions: Fall;Sternal ?Precaution Booklet Issued: Yes (comment) ?Precaution Comments: LVAD,  sternal, NG tube ?Restrictions ?Weight Bearing Restrictions: Yes ?Other Position/Activity Restrictions: sternal precautions  ?  ? ?Mobility ? Bed Mobility ?  ?  ?  ?  ?  ?  ?  ?General bed mobility comments: Pt up in chair upon arrival. ?  ? ?Transfers ?Overall transfer level: Needs assistance ?Equipment used: Rolling walker (2 wheels) ?Transfers: Sit to/from Stand ?Sit to Stand: Min assist, Max assist ?  ?  ?  ?  ?  ?General transfer comment: MinA at buttocks to boost pt up to stand from recliner the first rep, cuing for hand placement to maintain sternal precautions. Attempted additional reps from recliner at end of session following x20 squats, with pt unable to come to stand with maxA and instead sliding anteriorly slightly, needing blocking and use of bed pad to scoot pt posteriorly in chair. Demonstrated gaining momentum when bringing "nose over toes" to improve power up to stand. Practiced rocking trunk. ?  ? ?Ambulation/Gait ?Ambulation/Gait assistance: Min guard, Mod assist ?Gait Distance (Feet): 180 Feet ?Assistive device: Rolling walker (2 wheels), None ?Gait Pattern/deviations: Step-through pattern, Decreased stride length, Trunk flexed, Wide base of support, Staggering right ?Gait velocity: reduced ?Gait velocity interpretation: <1.31 ft/sec, indicative of household ambulator ?  ?General Gait Details: Pt slow, mildly unsteady gait using RW, min guard for safety the first half with RW. When ambulating second half without UE support, pt with increased R lateral trunk sway, needing minA for stability and x1 LOB to the R needing modA to recover. ? ? ?Stairs ?  ?  ?  ?  ?  ? ? ?Wheelchair Mobility ?  ? ?Modified Rankin (Stroke Patients Only) ?  ? ? ?  ?Balance Overall balance assessment: Needs assistance ?Sitting-balance support: Feet supported, No  upper extremity supported ?Sitting balance-Leahy Scale: Fair ?  ?  ?Standing balance support: Bilateral upper extremity supported, During functional activity, No  upper extremity supported ?Standing balance-Leahy Scale: Poor ?Standing balance comment: Benefits from RW, x1 LOB when trying to ambulate without UE support, modA to recover ?  ?  ?  ?  ?  ?  ?  ?  ?  ?  ?  ?  ? ?  ?Cognition Arousal/Alertness: Awake/alert ?Behavior During Therapy: [facility] for tasks assessed/performed, Anxious ?Overall Cognitive Status: Within Functional Limits for tasks assessed ?  ?  ?  ?  ?  ?  ?  ?  ?  ?  ?  ?  ?  ?  ?  ?  ?General Comments: sternal precaution cues - good memory for them but needs reminders for follow through. Anxious occasionally, reporting fear of falling with transfers. ?  ?  ? ?  ?Exercises General Exercises - Lower Extremity ?Mini-Sqauts: AROM, Strengthening, Both, Standing, 20 reps (x10 mini-squats with arms on chest followed by x10 deeper squats (touching buttocks to pillow on seat of recliner) with arms on chest) ? ?  ?General Comments   ?  ?  ? ?Pertinent Vitals/Pain Pain Assessment ?Pain Assessment: Faces ?Faces Pain Scale: Hurts little more ?Pain Location: chest at incision ?Pain Descriptors / Indicators: Guarding, Grimacing, Operative site guarding ?Pain Intervention(s): Limited activity within patient's tolerance, Monitored during session, Repositioned  ? ? ?Home Living   ?  ?  ?  ?  ?  ?  ?  ?  ?  ?   ?  ?Prior Function    ?  ?  ?   ? ?PT Goals (current goals can now be found in the care plan section) Acute Rehab PT Goals ?Patient Stated Goal: to go to [facility] ?PT Goal Formulation: With patient/family ?Time For Goal Achievement: 09/26/21 ?Potential to Achieve Goals: Good ?Progress towards PT goals: Progressing toward goals ? ?  ?Frequency ? ? ? Min 4X/week ? ? ? ?  ?PT Plan Current plan remains appropriate  ? ? ?Co-evaluation   ?  ?  ?  ?  ? ?  ?AM-PAC PT "6 Clicks" Mobility   ?Outcome Measure ? Help needed turning from your back to your side while in a flat bed without using bedrails?: A Little ?Help needed moving from lying on your back to sitting on the side of a flat  bed without using bedrails?: A Little ?Help needed moving to and from a bed to a chair (including a wheelchair)?: A Little ?Help needed standing up from a chair using your arms (e.g., wheelchair or bedside chair)?: A Lot ?Help needed to walk in hospital room?: A Lot ?Help needed climbing 3-5 steps with a railing? : Total ?6 Click Score: 14 ? ?  ?End of Session   ?Activity Tolerance: Patient tolerated treatment well ?Patient left: in chair;with call [place]/phone within reach;with family/visitor present ?  ?PT Visit Diagnosis: Other abnormalities of gait and mobility (R26.89);Muscle weakness (generalized) (M62.81);Unsteadiness on feet (R26.81);Difficulty in walking, not elsewhere classified (R26.2) ?  ? ? ?Time: 3244-0102 ?PT Time Calculation (min) (ACUTE ONLY): 23 min ? ?Charges:  $Gait Training: 8-22 mins ?$Therapeutic Activity: 8-22 mins          ?          ? ?Moishe Spice, PT, DPT ?Acute Rehabilitation Services  ?Pager: [phone] ?Office: [phone] ? ? ? ?Maretta Bees Pettis ?09/19/2021, 4:59 PM ? ?

## 2021-09-19 NOTE — Progress Notes (Signed)
HeartMate 3 Rounding Note ? ?Postop day  #16 HeartMate 3 Implantation March 14 ? ?Subjective:   ?Patient maintaining sinus rhythm  on IV amiodarone, will transition to oral amiodarone dosing today. ?Lasix drip off, edema much improved ?Ramp Echo study- speed up to 5800 rpm ?Appetite improving daily- transition to HS tube feeds ?Incisions healing, chest tube sutures removed today. ?Patient feeling much better and stronger ?Plan is for CIR transfer soon. ? ? ?LVAD INTERROGATION:  ?HeartMate 3LVAD:  Flow 5.0 liters/min, speed 5800, power 4.8 PI 3.0.  Controller  intact.  ? ?Objective:   ? ?Vital Signs:   ?Temp:  [98 ?F (36.7 ?C)-98.6 ?F (37 ?C)] 98.6 ?F (37 ?C) (03/30 0340) ?Pulse Rate:  [69-73] 69 (03/30 0340) ?Resp:  [19-22] 19 (03/30 0340) ?BP: (74-101)/(57-69) 74/59 (03/30 0340) ?SpO2:  [93 %-95 %] 93 % (03/30 0340) ?Arterial Line BP: (73-79)/(59-63) 76/59 (03/30 0340) ?Weight:  [100.2 kg] 100.2 kg (03/30 0451) ?Last BM Date : 09/17/21 ?Mean arterial Pressure 65-19m Hg ? ?Intake/Output:  ? ?Intake/Output Summary (Last 24 hours) at 09/19/2021 0930 ?Last data filed at 09/19/2021 0540 ?Gross per 24 hour  ?Intake 1807.51 ml  ?Output 2000 ml  ?Net -192.49 ml  ?  ? ?Physical Exam: ?General:  Well appearing. No resp difficulty, mild tachypnea. Sternal incision dry ?HEENT: normal ?Neck: supple. JVP . 2+ carotids  no bruits. No lymphadenopathy or thryomegaly appreciated. L neck drain for Impella 5.5 out and incision healing ?Cor: Mechanical heart sounds with LVAD hum present. ?Lungs: clear ?Abdomen: soft, nontender, nondistended. No hepatosplenomegaly. No bruits or masses. Good bowel sounds..  Persistent serous drainage around power cord exit site, daily dressing changes to continue ?Extremities: no cyanosis, clubbing, rash, edema ?Neuro: alert & orientedx3, cranial nerves grossly intact. moves all 4 extremities w/o difficulty. Affect pleasant ? ?Telemetry: sinus tach ? ?Labs: ?Basic Metabolic Panel: ?Recent Labs  ?Lab  09/13/21 ?1436 09/14/21 ?0204 09/14/21 ?0431 09/14/21 ?0027203/25/23 ?1629 09/15/21 ?0320 09/15/21 ?1429 09/16/21 ?0500 09/16/21 ?1430 09/17/21 ?0140 09/17/21 ?1435 09/18/21 ?0357 09/18/21 ?1418 09/19/21 ?05366 ?NA 137   < > 137   < > 136 137   < > 137   < > 138 140 141 140 143  ?K 3.7  3.8   < > 4.0   < > 3.4* 2.6*   < > 2.2*   < > 3.3* 5.4* 2.7* 4.9 2.9*  ?CL 91*   < > 91*   < > 88* 85*   < > 84*   < > 92* 99 97* 102 105  ?CO2 35*   < > 38*   < > 38* 41*   < > 42*   < > 38* 32 35* 31 30  ?GLUCOSE 110*   < > 119*   < > 141* 122*   < > 113*   < > 103* 155* 111* 188* 127*  ?BUN 38*   < > 39*   < > 43* 44*   < > 55*   < > 58* 48* 49* 43* 45*  ?CREATININE 1.33*   < > 1.28*   < > 1.23* 1.22*   < > 1.16*   < > 1.04* 1.01* 1.01* 0.94 0.95  ?CALCIUM 9.3   < > 9.2   < > 9.1 9.1   < > 8.9   < > 9.0 8.4* 8.7* 8.5* 8.7*  ?MG 2.0  --  1.9  --  2.6* 2.2  --  2.0  --  2.2  --  2.3  --  2.4  ?PHOS 3.5  --  2.9  --  3.1  --   --   --   --   --   --   --   --   --   ? < > = values in this interval not displayed.  ? ? ?Liver Function Tests: ?No results for input(s): AST, ALT, ALKPHOS, BILITOT, PROT, ALBUMIN in the last 168 hours. ? ?No results for input(s): LIPASE, AMYLASE in the last 168 hours. ?No results for input(s): AMMONIA in the last 168 hours. ? ?CBC: ?Recent Labs  ?Lab 09/15/21 ?0320 09/16/21 ?0500 09/17/21 ?0140 09/18/21 ?0357 09/19/21 ?1610  ?WBC 30.1* 27.1* 24.1* 17.9* 15.8*  ?NEUTROABS 26.0* 23.2* 20.0* 14.2* 12.4*  ?HGB 9.0* 8.9* 9.4* 8.6* 8.7*  ?HCT 29.5* 29.0* 32.0* 29.6* 30.7*  ?MCV 93.1 93.2 95.8 96.7 99.4  ?PLT 413* 395 355 299 276  ? ? ?INR: ?Recent Labs  ?Lab 09/15/21 ?0320 09/16/21 ?0500 09/17/21 ?0140 09/18/21 ?0357 09/19/21 ?9604  ?INR 2.3* 2.0* 1.8* 2.0* 2.2*  ? ? ?Other results: ?EKG:  ? ?Imaging: ?DG Chest Port 1 View ? ?Result Date: 09/18/2021 ?CLINICAL DATA:  LVAD. EXAM: PORTABLE CHEST 1 VIEW COMPARISON:  Chest radiograph 09/15/2021 FINDINGS: Patient has an LVAD and left chest ICD. Stable cardiomegaly.  Feeding tube extends in the abdomen and the tip is incompletely visualized. Right arm PICC line tip terminates in the right atrium region. Patchy densities in the right lower lung are minimally changed. Slightly improved aeration in the lungs compared to the recent comparison examination. IMPRESSION: 1. Slightly improved aeration in the lungs with persistent densities in the right lower chest. These densities are nonspecific but may be related to atelectasis. 2. Support apparatuses as described. 3. Cardiomegaly. Electronically Signed   By: Markus Daft M.D.   On: 09/18/2021 08:21  ? ?ECHOCARDIOGRAM LIMITED ? ?Result Date: 09/18/2021 ?   ECHOCARDIOGRAM LIMITED REPORT   Patient Name:   [name] Date of Exam: 09/18/2021 Medical Rec #:  [id number]    Height:       63.0 in Accession #:    [id number]   Weight:       219.8 lb Date of Birth:  04-Oct-1970    BSA:          2.013 m? Patient Age:    51 years     BP:           0/0 mmHg Patient Gender: F            HR:           61 bpm. Exam Location:  Inpatient Procedure: Limited Echo, Color Doppler and Cardiac Doppler Indications:    LVAD RAMP  History:        Patient has prior history of Echocardiogram examinations, most                 recent 09/11/2021. CHF, Defibrillator; Pulmonary HTN.  Sonographer:    Raquel Sarna Senior RDCS Referring Phys: [id number] [place]  1. 5600 RPM     The septum is: neutral with leftward septal shift with diastole     There is no suck down.     Right ventricle is mildly dilated with mildly reduced systolic function.     The aortic valve: opens every cycle     Aortic regurgitation: mild-moderate     Mitral regurgitation: mild     Tricuspid regurgitation: moderate-severe         Inflow  cannula is not visualized.     Outflow cannula is not visualized.         LVAD ramp study:     5700 RPM     The septum is: neutral with leftward septal shift with diastole     There is no suck down.     Right ventricle is mildly dilated with mildly reduced systolic  function.     The aortic valve: opens every cycle     Aortic regurgitation: mild-moderate     Mitral regurgitation: mild     Tricuspid regurgitation: moderate-severe         5800 RPM     The septum is: neutral with reduced leftward septal shift in diastole.     There is no suck down.     Right ventricle is mildly dilated with mildly reduced systolic function.     The aortic valve: opens every cycle     Aortic regurgitation: mild-moderate     Mitral regurgitation: mild     Tricuspid regurgitation: mild-moderate. FINDINGS  Left Ventricular Assist Device: 4825 RPM The septum is: neutral with leftward septal shift with diastole There is no suck down. Right ventricle is mildly dilated with mildly reduced systolic function. The aortic valve: opens every cycle Aortic regurgitation: mild-moderate Mitral regurgitation: mild Tricuspid regurgitation: moderate-severe Inflow cannula is not visualized. Outflow cannula is not visualized. LVAD ramp study: 5700 RPM The septum is: neutral with leftward septal shift with diastole There is no suck down. Right ventricle is mildly dilated with mildly reduced systolic function. The aortic valve: opens every cycle Aortic regurgitation: mild-moderate Mitral regurgitation: mild Tricuspid regurgitation: moderate-severe  5800 RPM The septum is: neutral with reduced leftward septal shift in diastole. There is no suck down. Right ventricle is mildly dilated with mildly reduced systolic function. The aortic valve: opens every cycle Aortic regurgitation: mild-moderate Mitral regurgitation: mild Tricuspid regurgitation: mild-moderate.  Additional Comments: A device lead is visualized in the right atrium and right ventricle. Cherlynn Kaiser MD Electronically signed by Cherlynn Kaiser MD Signature Date/Time: 09/18/2021/7:02:31 PM    Final    ? ? ?Medications:   ? ? ?Scheduled Medications: ? amiodarone  400 mg Oral BID  ? aspirin EC  81 mg Oral Daily  ? Chlorhexidine Gluconate Cloth  6 each Topical  Daily  ? digoxin  0.0625 mg Oral Daily  ? gabapentin  100 mg Oral TID  ? insulin aspart  0-24 Units Subcutaneous TID WC & HS  ? methimazole  10 mg Oral Daily  ? methimazole  5 mg Oral QPM  ? midodrine  5 mg Karin Lieu

## 2021-09-19 NOTE — Progress Notes (Signed)
ANTICOAGULATION CONSULT NOTE - Initial Consult ? ?Pharmacy Consult for warfarin ?Indication:  LVAD HM3 ? ?No Known Allergies ? ?Patient Measurements: ?Height: '5\' 3"'$  (160 cm) ?Weight: 100.2 kg (220 lb 14.4 oz) ?IBW/kg (Calculated) : 52.4 ? ?Vital Signs: ?Temp: 98.6 ?F (37 ?C) (03/30 0340) ?Temp Source: Oral (03/30 0340) ?BP: 73/49 (03/30 0755) ?Pulse Rate: 69 (03/30 0340) ? ?Labs: ?Recent Labs  ?  09/17/21 ?0140 09/17/21 ?1435 09/18/21 ?0357 09/18/21 ?1418 09/19/21 ?1287  ?HGB 9.4*  --  8.6*  --  8.7*  ?HCT 32.0*  --  29.6*  --  30.7*  ?PLT 355  --  299  --  276  ?LABPROT 20.5*  --  23.0*  --  24.4*  ?INR 1.8*  --  2.0*  --  2.2*  ?CREATININE 1.04*   < > 1.01* 0.94 0.95  ? < > = values in this interval not displayed.  ? ? ? ?Estimated Creatinine Clearance: 80 mL/min (by C-G formula based on SCr of 0.95 mg/dL). ? ? ?Medical History: ?Past Medical History:  ?Diagnosis Date  ? Automatic implantable cardioverter-defibrillator in situ   ? Chronic CHF (congestive heart failure) (HCC)   ? a. EF 15-20% b. RHC (09/2013) RA 14, RV 57/22, PA 64/36 (48), PCWP 18, FIck CO/CI 3.7/1.6, PVR 8.1 WU, PA sat 47%   ? History of stomach ulcers   ? Hypertension   ? Hypotension   ? Hypothyroidism   ? Morbid obesity ([place])   ? Myocardial infarction [facility]) 08/2013  ? Nocturnal dyspnea   ? Nonischemic cardiomyopathy ([place])   ? Sinus tachycardia   ? Sleep apnea   ? Snoring-prob OSA 09/04/2011  ? Sprint Fidelis ICD lead RECALL  [phone]   ? UARS (upper airway resistance syndrome) 09/04/2011  ? HST 12/2013:  AHI 4/hr (numerous episodes of airflow reduction that did not have concomitant desaturation)   ? ? ?Medications:  ?Scheduled:  ? amiodarone  400 mg Oral BID  ? aspirin EC  81 mg Oral Daily  ? Chlorhexidine Gluconate Cloth  6 each Topical Daily  ? digoxin  0.0625 mg Oral Daily  ? gabapentin  100 mg Oral TID  ? insulin aspart  0-24 Units Subcutaneous TID WC & HS  ? methimazole  10 mg Oral Daily  ? methimazole  5 mg Oral QPM  ? midodrine  5 mg Oral TID WC   ? mirtazapine  7.5 mg Oral QHS  ? pantoprazole  40 mg Oral Daily  ? potassium chloride  40 mEq Per Tube BID  ? potassium chloride  40 mEq Oral Once  ? predniSONE  20 mg Oral Q breakfast  ? quiniDINE gluconate  324 mg Oral BID  ? rosuvastatin  40 mg Oral QHS  ? sildenafil  20 mg Oral TID  ? sodium chloride flush  10-40 mL Intracatheter Q12H  ? torsemide  60 mg Oral Daily  ? Warfarin - Pharmacist Dosing Inpatient   Does not apply H2094  ? ? ?Assessment: ?51 yo F who is s/p HM3 LVAD placement on 3/14. Patient has been started on warfarin and baby aspirin. Baby aspirin will be continued indefinitely given prior CVA. Average warfarin dose over the past 7 days is ~3 mg. INR therapeutic in range today at 2.2. Patient has ben eating more since CoreTrack placed on Friday. LDH had been stable in the mid 500s down to 490 today. Hgb stable, plts wnl.  ? ?Goal of Therapy:  ?INR 2-2.5 ?Monitor platelets by anticoagulation protocol: Yes ?  ?  Plan:  ?Warfarin 3.5 mg tonight ?Daily INR and CBC ? ?Cathrine Muster, PharmD ?PGY2 Cardiology Pharmacy Resident ?Phone: [phone] ?09/19/2021  10:23 AM ? ?Please check [email or web address] for unit-specific pharmacy phone numbers. ? ?Eduard Clos [place] ?09/19/2021,10:23 AM ? ? ?

## 2021-09-20 ENCOUNTER — Inpatient Hospital Stay (HOSPITAL_COMMUNITY)
Admission: RE | Admit: 2021-09-20 | Discharge: 2021-09-26 | DRG: 644 | Disposition: A | Discharge status: Home / Self Care | Payer: BC Managed Care – PPO | Source: Intra-hospital | Attending: Physical Medicine & Rehabilitation | Admitting: Physical Medicine & Rehabilitation

## 2021-09-20 ENCOUNTER — Encounter (HOSPITAL_COMMUNITY): Payer: Self-pay | Admitting: Physical Medicine & Rehabilitation

## 2021-09-20 ENCOUNTER — Other Ambulatory Visit: Payer: Self-pay

## 2021-09-20 DIAGNOSIS — Z79899 Other long term (current) drug therapy: Secondary | ICD-10-CM

## 2021-09-20 DIAGNOSIS — I5043 Acute on chronic combined systolic (congestive) and diastolic (congestive) heart failure: Secondary | ICD-10-CM | POA: Diagnosis not present

## 2021-09-20 DIAGNOSIS — N1831 Chronic kidney disease, stage 3a: Secondary | ICD-10-CM | POA: Diagnosis not present

## 2021-09-20 DIAGNOSIS — I130 Hypertensive heart and chronic kidney disease with heart failure and stage 1 through stage 4 chronic kidney disease, or unspecified chronic kidney disease: Secondary | ICD-10-CM | POA: Diagnosis not present

## 2021-09-20 DIAGNOSIS — I480 Paroxysmal atrial fibrillation: Secondary | ICD-10-CM

## 2021-09-20 DIAGNOSIS — E785 Hyperlipidemia, unspecified: Secondary | ICD-10-CM | POA: Diagnosis present

## 2021-09-20 DIAGNOSIS — I69351 Hemiplegia and hemiparesis following cerebral infarction affecting right dominant side: Secondary | ICD-10-CM

## 2021-09-20 DIAGNOSIS — Z823 Family history of stroke: Secondary | ICD-10-CM | POA: Diagnosis not present

## 2021-09-20 DIAGNOSIS — Z7984 Long term (current) use of oral hypoglycemic drugs: Secondary | ICD-10-CM

## 2021-09-20 DIAGNOSIS — D72829 Elevated white blood cell count, unspecified: Secondary | ICD-10-CM | POA: Diagnosis not present

## 2021-09-20 DIAGNOSIS — E876 Hypokalemia: Secondary | ICD-10-CM | POA: Diagnosis not present

## 2021-09-20 DIAGNOSIS — I428 Other cardiomyopathies: Secondary | ICD-10-CM | POA: Diagnosis present

## 2021-09-20 DIAGNOSIS — I509 Heart failure, unspecified: Secondary | ICD-10-CM | POA: Diagnosis not present

## 2021-09-20 DIAGNOSIS — Z8249 Family history of ischemic heart disease and other diseases of the circulatory system: Secondary | ICD-10-CM | POA: Diagnosis not present

## 2021-09-20 DIAGNOSIS — R5381 Other malaise: Secondary | ICD-10-CM | POA: Diagnosis not present

## 2021-09-20 DIAGNOSIS — I447 Left bundle-branch block, unspecified: Secondary | ICD-10-CM | POA: Diagnosis present

## 2021-09-20 DIAGNOSIS — R7401 Elevation of levels of liver transaminase levels: Secondary | ICD-10-CM

## 2021-09-20 DIAGNOSIS — E0590 Thyrotoxicosis, unspecified without thyrotoxic crisis or storm: Secondary | ICD-10-CM | POA: Diagnosis present

## 2021-09-20 DIAGNOSIS — D62 Acute posthemorrhagic anemia: Secondary | ICD-10-CM | POA: Diagnosis not present

## 2021-09-20 DIAGNOSIS — I2721 Secondary pulmonary arterial hypertension: Secondary | ICD-10-CM | POA: Diagnosis present

## 2021-09-20 DIAGNOSIS — I340 Nonrheumatic mitral (valve) insufficiency: Secondary | ICD-10-CM | POA: Diagnosis not present

## 2021-09-20 DIAGNOSIS — Z95810 Presence of automatic (implantable) cardiac defibrillator: Secondary | ICD-10-CM

## 2021-09-20 DIAGNOSIS — E039 Hypothyroidism, unspecified: Secondary | ICD-10-CM | POA: Diagnosis not present

## 2021-09-20 DIAGNOSIS — G4733 Obstructive sleep apnea (adult) (pediatric): Secondary | ICD-10-CM | POA: Diagnosis present

## 2021-09-20 DIAGNOSIS — I472 Ventricular tachycardia, unspecified: Secondary | ICD-10-CM | POA: Diagnosis not present

## 2021-09-20 DIAGNOSIS — T502X5A Adverse effect of carbonic-anhydrase inhibitors, benzothiadiazides and other diuretics, initial encounter: Secondary | ICD-10-CM | POA: Diagnosis not present

## 2021-09-20 DIAGNOSIS — I4891 Unspecified atrial fibrillation: Secondary | ICD-10-CM | POA: Diagnosis present

## 2021-09-20 DIAGNOSIS — R0603 Acute respiratory distress: Secondary | ICD-10-CM | POA: Diagnosis not present

## 2021-09-20 DIAGNOSIS — Z7982 Long term (current) use of aspirin: Secondary | ICD-10-CM

## 2021-09-20 DIAGNOSIS — I5032 Chronic diastolic (congestive) heart failure: Secondary | ICD-10-CM | POA: Diagnosis not present

## 2021-09-20 DIAGNOSIS — I959 Hypotension, unspecified: Secondary | ICD-10-CM | POA: Diagnosis not present

## 2021-09-20 DIAGNOSIS — Z6839 Body mass index (BMI) 39.0-39.9, adult: Secondary | ICD-10-CM

## 2021-09-20 DIAGNOSIS — I252 Old myocardial infarction: Secondary | ICD-10-CM

## 2021-09-20 DIAGNOSIS — Z95811 Presence of heart assist device: Secondary | ICD-10-CM | POA: Diagnosis not present

## 2021-09-20 DIAGNOSIS — T380X5A Adverse effect of glucocorticoids and synthetic analogues, initial encounter: Secondary | ICD-10-CM | POA: Diagnosis not present

## 2021-09-20 DIAGNOSIS — E46 Unspecified protein-calorie malnutrition: Secondary | ICD-10-CM | POA: Diagnosis not present

## 2021-09-20 DIAGNOSIS — F419 Anxiety disorder, unspecified: Secondary | ICD-10-CM | POA: Diagnosis not present

## 2021-09-20 LAB — MAGNESIUM: Magnesium: 2.2 mg/dL (ref 1.7–2.4)

## 2021-09-20 LAB — BASIC METABOLIC PANEL
Anion gap: 5 (ref 5–15)
Anion gap: 11 (ref 5–15)
BUN: 40 mg/dL — ABNORMAL HIGH (ref 6–20)
BUN: 32 mg/dL — ABNORMAL HIGH (ref 6–20)
CO2: 28 mmol/L (ref 22–32)
CO2: 26 mmol/L (ref 22–32)
Calcium: 9.1 mg/dL (ref 8.9–10.3)
Calcium: 8.7 mg/dL — ABNORMAL LOW (ref 8.9–10.3)
Chloride: 107 mmol/L (ref 98–111)
Chloride: 105 mmol/L (ref 98–111)
Creatinine, Ser: 1 mg/dL (ref 0.44–1.00)
Creatinine, Ser: 0.83 mg/dL (ref 0.44–1.00)
GFR, Estimated: >60 mL/min (ref >60)
GFR, Estimated: >60 mL/min (ref >60)
Glucose, Bld: 110 mg/dL — ABNORMAL HIGH (ref 70–99)
Glucose, Bld: 89 mg/dL (ref 70–99)
Potassium: 5.4 mmol/L — ABNORMAL HIGH (ref 3.5–5.1)
Potassium: 3.7 mmol/L (ref 3.5–5.1)
Sodium: 140 mmol/L (ref 135–145)
Sodium: 142 mmol/L (ref 135–145)

## 2021-09-20 LAB — CBC WITH DIFFERENTIAL/PLATELET
Abs Immature Granulocytes: 0.42 10*3/uL — ABNORMAL HIGH (ref 0.00–0.07)
Basophils Absolute: 0 10*3/uL (ref 0.0–0.1)
Basophils Relative: 0 %
Eosinophils Absolute: 0 10*3/uL (ref 0.0–0.5)
Eosinophils Relative: 0 %
HCT: 31.3 % — ABNORMAL LOW (ref 36.0–46.0)
Hemoglobin: 9.2 g/dL — ABNORMAL LOW (ref 12.0–15.0)
Immature Granulocytes: 3 %
Lymphocytes Relative: 14 %
Lymphs Abs: 2.2 10*3/uL (ref 0.7–4.0)
MCH: 28.6 pg (ref 26.0–34.0)
MCHC: 29.4 g/dL — ABNORMAL LOW (ref 30.0–36.0)
MCV: 97.2 fL (ref 80.0–100.0)
Monocytes Absolute: 1.3 10*3/uL — ABNORMAL HIGH (ref 0.1–1.0)
Monocytes Relative: 8 %
Neutro Abs: 12.1 10*3/uL — ABNORMAL HIGH (ref 1.7–7.7)
Neutrophils Relative %: 75 %
Platelets: 271 10*3/uL (ref 150–400)
RBC: 3.22 MIL/uL — ABNORMAL LOW (ref 3.87–5.11)
RDW: 23.6 % — ABNORMAL HIGH (ref 11.5–15.5)
WBC: 16.1 10*3/uL — ABNORMAL HIGH (ref 4.0–10.5)
nRBC: 1.2 % — ABNORMAL HIGH (ref 0.0–0.2)

## 2021-09-20 LAB — COOXEMETRY PANEL
Carboxyhemoglobin: 1.9 % — ABNORMAL HIGH (ref 0.5–1.5)
Methemoglobin: 1 % (ref 0.0–1.5)
O2 Saturation: 66.6 %
Total hemoglobin: 10.2 g/dL — ABNORMAL LOW (ref 12.0–16.0)

## 2021-09-20 LAB — PROTIME-INR
INR: 2.4 — ABNORMAL HIGH (ref 0.8–1.2)
Prothrombin Time: 26.1 seconds — ABNORMAL HIGH (ref 11.4–15.2)

## 2021-09-20 LAB — GLUCOSE, CAPILLARY
Glucose-Capillary: 105 mg/dL — ABNORMAL HIGH (ref 70–99)
Glucose-Capillary: 102 mg/dL — ABNORMAL HIGH (ref 70–99)
Glucose-Capillary: 136 mg/dL — ABNORMAL HIGH (ref 70–99)
Glucose-Capillary: 88 mg/dL (ref 70–99)
Glucose-Capillary: 106 mg/dL — ABNORMAL HIGH (ref 70–99)

## 2021-09-20 LAB — LACTATE DEHYDROGENASE: LDH: 450 U/L — ABNORMAL HIGH (ref 98–192)

## 2021-09-20 MED ORDER — SILDENAFIL CITRATE 20 MG PO TABS
20.0000 mg | ORAL_TABLET | Freq: Three times a day (TID) | ORAL | Status: DC
  Administered 2021-09-20 – 2021-09-26 (×17): 20 mg via ORAL
  Filled 2021-09-20 (×18): qty 1

## 2021-09-20 MED ORDER — METHIMAZOLE 10 MG PO TABS
10.0000 mg | ORAL_TABLET | Freq: Every day | ORAL | Status: DC
  Administered 2021-09-21 – 2021-09-26 (×6): 10 mg via ORAL
  Filled 2021-09-20 (×6): qty 1

## 2021-09-20 MED ORDER — SENNOSIDES-DOCUSATE SODIUM 8.6-50 MG PO TABS: 2.0000 | ORAL_TABLET | Freq: Two times a day (BID) | ORAL | Status: DC | PRN

## 2021-09-20 MED ORDER — PREDNISONE 20 MG PO TABS
20.0000 mg | ORAL_TABLET | Freq: Every day | ORAL | Status: DC
  Administered 2021-09-21 – 2021-09-26 (×6): 20 mg via ORAL
  Filled 2021-09-20 (×6): qty 1

## 2021-09-20 MED ORDER — QUINIDINE GLUCONATE ER 324 MG PO TBCR
324.0000 mg | EXTENDED_RELEASE_TABLET | Freq: Two times a day (BID) | ORAL | Status: DC
  Administered 2021-09-20 – 2021-09-26 (×12): 324 mg via ORAL
  Filled 2021-09-20 (×13): qty 1

## 2021-09-20 MED ORDER — TRAMADOL HCL 50 MG PO TABS
50.0000 mg | ORAL_TABLET | ORAL | Status: DC | PRN
  Administered 2021-09-21 – 2021-09-23 (×3): 50 mg via ORAL
  Filled 2021-09-20 (×4): qty 1

## 2021-09-20 MED ORDER — WARFARIN SODIUM 3 MG PO TABS: 3.0000 mg | ORAL_TABLET | Freq: Every day | ORAL | Status: DC

## 2021-09-20 MED ORDER — ASPIRIN EC 81 MG PO TBEC
81.0000 mg | DELAYED_RELEASE_TABLET | Freq: Every day | ORAL | Status: DC
  Administered 2021-09-21 – 2021-09-26 (×6): 81 mg via ORAL
  Filled 2021-09-20 (×6): qty 1

## 2021-09-20 MED ORDER — TORSEMIDE 20 MG PO TABS
80.0000 mg | ORAL_TABLET | Freq: Every day | ORAL | Status: DC
  Administered 2021-09-21: 80 mg via ORAL
  Filled 2021-09-20 (×2): qty 4

## 2021-09-20 MED ORDER — MIRTAZAPINE 15 MG PO TABS
7.5000 mg | ORAL_TABLET | Freq: Every day | ORAL | Status: DC
  Administered 2021-09-20 – 2021-09-25 (×6): 7.5 mg via ORAL
  Filled 2021-09-20 (×6): qty 1

## 2021-09-20 MED ORDER — TORSEMIDE 40 MG PO TABS: 80.0000 mg | ORAL_TABLET | Freq: Every day | ORAL | Status: DC

## 2021-09-20 MED ORDER — AMIODARONE HCL 400 MG PO TABS: 400.0000 mg | ORAL_TABLET | Freq: Two times a day (BID) | ORAL | Status: DC

## 2021-09-20 MED ORDER — WARFARIN SODIUM 3 MG PO TABS
3.0000 mg | ORAL_TABLET | Freq: Once | ORAL | Status: AC
  Administered 2021-09-20: 3 mg via ORAL
  Filled 2021-09-20: qty 1

## 2021-09-20 MED ORDER — TORSEMIDE 20 MG PO TABS
80.0000 mg | ORAL_TABLET | Freq: Every day | ORAL | Status: DC
  Administered 2021-09-20: 80 mg via ORAL
  Filled 2021-09-20: qty 4

## 2021-09-20 MED ORDER — ASPIRIN 81 MG PO TBEC: 81.0000 mg | DELAYED_RELEASE_TABLET | Freq: Every day | ORAL | 11 refills | Status: DC

## 2021-09-20 MED ORDER — ALPRAZOLAM 0.25 MG PO TABS: 0.2500 mg | ORAL_TABLET | Freq: Three times a day (TID) | ORAL | 0 refills | Status: DC | PRN

## 2021-09-20 MED ORDER — PIVOT 1.5 CAL PO LIQD: 840.0000 mL | ORAL | 0 refills | Status: DC

## 2021-09-20 MED ORDER — DIGOXIN 62.5 MCG PO TABS: 0.0625 mg | ORAL_TABLET | Freq: Every day | ORAL | Status: DC

## 2021-09-20 MED ORDER — METHIMAZOLE 5 MG PO TABS: 5.0000 mg | ORAL_TABLET | Freq: Every evening | ORAL | Status: DC

## 2021-09-20 MED ORDER — LOPERAMIDE HCL 1 MG/7.5ML PO SUSP
1.0000 mg | Freq: Three times a day (TID) | ORAL | 0 refills | Status: DC | PRN
Start: 2021-09-20 — End: 2021-09-26

## 2021-09-20 MED ORDER — SILDENAFIL CITRATE 20 MG PO TABS: 20.0000 mg | ORAL_TABLET | Freq: Three times a day (TID) | ORAL | 0 refills | Status: DC

## 2021-09-20 MED ORDER — MIRTAZAPINE 7.5 MG PO TABS: 7.5000 mg | ORAL_TABLET | Freq: Every day | ORAL | Status: DC

## 2021-09-20 MED ORDER — PANTOPRAZOLE SODIUM 40 MG PO TBEC
40.0000 mg | DELAYED_RELEASE_TABLET | Freq: Every day | ORAL | Status: DC
  Administered 2021-09-21 – 2021-09-26 (×6): 40 mg via ORAL
  Filled 2021-09-20 (×6): qty 1

## 2021-09-20 MED ORDER — SALINE SPRAY 0.65 % NA SOLN
1.0000 | NASAL | 0 refills | Status: DC | PRN
Start: 2021-09-20 — End: 2021-09-26

## 2021-09-20 MED ORDER — DIGOXIN 0.0625 MG HALF TABLET
0.0625 mg | ORAL_TABLET | Freq: Every day | ORAL | Status: DC
  Administered 2021-09-21 – 2021-09-26 (×6): 0.0625 mg via ORAL
  Filled 2021-09-20 (×6): qty 1

## 2021-09-20 MED ORDER — MIDODRINE HCL 5 MG PO TABS
5.0000 mg | ORAL_TABLET | Freq: Three times a day (TID) | ORAL | Status: DC
  Administered 2021-09-20 – 2021-09-26 (×17): 5 mg via ORAL
  Filled 2021-09-20 (×17): qty 1

## 2021-09-20 MED ORDER — INSULIN ASPART 100 UNIT/ML IJ SOLN: 0.0000 [IU] | Freq: Three times a day (TID) | INTRAMUSCULAR | 11 refills | Status: DC

## 2021-09-20 MED ORDER — INSULIN ASPART 100 UNIT/ML IJ SOLN
0.0000 [IU] | Freq: Three times a day (TID) | INTRAMUSCULAR | Status: DC
  Administered 2021-09-22: 2 [IU] via SUBCUTANEOUS

## 2021-09-20 MED ORDER — PIVOT 1.5 CAL PO LIQD
840.0000 mL | ORAL | Status: DC
  Administered 2021-09-20: 840 mL
  Filled 2021-09-20 (×2): qty 1000

## 2021-09-20 MED ORDER — LOPERAMIDE HCL 1 MG/7.5ML PO SUSP: 1.0000 mg | Freq: Three times a day (TID) | ORAL | Status: DC | PRN

## 2021-09-20 MED ORDER — PREDNISONE 20 MG PO TABS: 20.0000 mg | ORAL_TABLET | Freq: Every day | ORAL | Status: DC

## 2021-09-20 MED ORDER — ROSUVASTATIN CALCIUM 20 MG PO TABS
40.0000 mg | ORAL_TABLET | Freq: Every day | ORAL | Status: DC
  Administered 2021-09-20 – 2021-09-25 (×6): 40 mg via ORAL
  Filled 2021-09-20 (×6): qty 2

## 2021-09-20 MED ORDER — SALINE SPRAY 0.65 % NA SOLN: 1.0000 | NASAL | Status: DC | PRN

## 2021-09-20 MED ORDER — METHIMAZOLE 5 MG PO TABS
5.0000 mg | ORAL_TABLET | Freq: Every evening | ORAL | Status: DC
  Administered 2021-09-20 – 2021-09-25 (×6): 5 mg via ORAL
  Filled 2021-09-20 (×6): qty 1

## 2021-09-20 MED ORDER — POTASSIUM CHLORIDE 20 MEQ PO PACK
40.0000 meq | PACK | Freq: Three times a day (TID) | ORAL | Status: DC
  Administered 2021-09-20 – 2021-09-23 (×8): 40 meq via ORAL
  Filled 2021-09-20 (×8): qty 2

## 2021-09-20 MED ORDER — MIDODRINE HCL 5 MG PO TABS: 5.0000 mg | ORAL_TABLET | Freq: Three times a day (TID) | ORAL | Status: DC

## 2021-09-20 MED ORDER — WARFARIN - PHARMACIST DOSING INPATIENT: Freq: Every day | Status: DC

## 2021-09-20 MED ORDER — GABAPENTIN 100 MG PO CAPS: 100.0000 mg | ORAL_CAPSULE | Freq: Three times a day (TID) | ORAL | Status: DC | PRN

## 2021-09-20 MED ORDER — AMIODARONE HCL 200 MG PO TABS
400.0000 mg | ORAL_TABLET | Freq: Two times a day (BID) | ORAL | Status: DC
  Administered 2021-09-20 – 2021-09-26 (×12): 400 mg via ORAL
  Filled 2021-09-20 (×13): qty 2

## 2021-09-20 MED ORDER — TORSEMIDE 20 MG PO TABS: 80.0000 mg | ORAL_TABLET | Freq: Every day | ORAL | Status: DC

## 2021-09-20 MED ORDER — ALPRAZOLAM 0.25 MG PO TABS
0.2500 mg | ORAL_TABLET | Freq: Three times a day (TID) | ORAL | Status: DC | PRN
  Administered 2021-09-21 (×2): 0.25 mg via ORAL
  Filled 2021-09-20 (×2): qty 1

## 2021-09-20 MED ORDER — POTASSIUM CHLORIDE 20 MEQ PO PACK
40.0000 meq | PACK | Freq: Once | ORAL | Status: AC
  Administered 2021-09-20: 40 meq via ORAL
  Filled 2021-09-20: qty 2

## 2021-09-20 MED ORDER — ONDANSETRON HCL 4 MG/2ML IJ SOLN: 4.0000 mg | Freq: Four times a day (QID) | INTRAMUSCULAR | 0 refills | Status: DC | PRN

## 2021-09-20 MED ORDER — PANTOPRAZOLE SODIUM 40 MG PO TBEC: 40.0000 mg | DELAYED_RELEASE_TABLET | Freq: Every day | ORAL | Status: DC

## 2021-09-20 MED ORDER — METHIMAZOLE 10 MG PO TABS
10.0000 mg | ORAL_TABLET | Freq: Every day | ORAL | Status: DC
Start: 2021-09-21 — End: 2021-09-26

## 2021-09-20 NOTE — H&P (Signed)
? ? ?Physical Medicine and Rehabilitation Admission H&P ? ?  ?Chief Complaint  ?Patient presents with  ? Shortness of Breath  ? Emesis  ? Palpitations  ?   ?  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  Per chart review patient lives with spouse.  Two-level home 3 steps to entry.  Reportedly independent prior to admission no assistive device but was limited by occasional shortness of breath.  Presented 08/25/2021 with increasing shortness of breath as well as fatigue with occasional nausea and palpitations.  She also reports nonproductive cough x1 month.  Denied any fever or sore throat.  She did see her primary cardiology PA on 08/21/2021 who recommended she take metolazone given concern for possible volume overload.  In the ED heart rate 116-130, BP 92-1 19/59-83, respiratory 25 saturating 100% on room air, BNP 1117, troponin 27-29.  Chest x-ray showed cardiomegaly without pulmonary edema.  CT for pulmonary emboli was negative.  EKG showed sinus tachycardia with left bundle branch block.  Echocardiogram with ejection fraction less than 20%.  No thrombus noted.  Cardiology follow-up milrinone was initiated.  Patient underwent insertion of implantable (LVAD) left ventricular assistive device 09/03/2021 per Dr. Darcey Nora.  Postoperative anemia receiving 1 unit packed red blood cells with latest hemoglobin 9.2.  She was slowly weaned off pressors as well as milrinone.  She  continued to undergo diuresis.  Bouts of atrial fibrillation continued on p.o. amiodarone as well as digoxin.  Patient has been placed on Coumadin as well as remains on low-dose aspirin for history of CVA.Marland Kitchen  Hospital course incidental finding of left ovarian mass noted on CT abdomen pelvis.  Unable to get MRI due to ICD.  Seen by GYN felt to be most likely nonneoplastic.  Tumor markers were within normal limits.  No ascites noted.  Patient not a surgical candidate at this time follow-up outpatient.  She did have some leukocytosis 30,100 suspect due to steroids that she had been placed on for hyperthyroidism no evidence for infection urine culture 50,000 gram-positive cocci.  Final culture with Enterococcus faecalis likely colonized completing course of vancomycin and cefazolin.  Decreased nutritional storage her diet has been advanced to regular she was placed on Remeron to help stimulate appetite.  She was requiring 12-hour tube feeds for nutritional supplement.  Palliative care was consulted to establish goals of care.  Therapy evaluations completed due to patient decreased functional mobility was admitted for a comprehensive rehab program. ? ?Review of Systems  ?Constitutional:  Positive for malaise/fatigue. Negative for chills and fever.  ?HENT:  Negative for hearing loss.   ?Eyes:  Negative for blurred vision and double vision.  ?Respiratory:  Positive for cough and shortness of breath. Negative for wheezing.   ?Cardiovascular:  Positive for chest pain, palpitations and leg swelling.  ?Gastrointestinal:  Positive for constipation and nausea.  ?Genitourinary:  Negative for dysuria, flank pain and hematuria.  ?Musculoskeletal:  Positive for joint pain and myalgias.  ?Skin:  Negative for rash.  ?Neurological:  Positive for weakness.  ?All other  systems reviewed and are negative. ?Past Medical History:  ?Diagnosis Date  ? Automatic implantable cardioverter-defibrillator in situ   ? Chronic CHF (congestive heart  failure) (HCC)   ? a. EF 15-20% b. RHC (09/2013) RA 14, RV 57/22, PA 64/36 (48), PCWP 18, FIck CO/CI 3.7/1.6, PVR 8.1 WU, PA sat 47%   ? History of stomach ulcers   ? Hypertension   ? Hypotension   ? Hypothyroidism   ? Morbid obesity ([place])   ? Myocardial infarction [facility]) 08/2013  ? Nocturnal dyspnea   ? Nonischemic cardiomyopathy ([place])   ? Sinus tachycardia   ? Sleep apnea   ? Snoring-prob OSA 09/04/2011  ? Sprint Fidelis ICD lead RECALL  [phone]   ? UARS (upper airway resistance syndrome) 09/04/2011  ? HST 12/2013:  AHI 4/hr (numerous episodes of airflow reduction that did not have concomitant desaturation)   ? ?Past Surgical History:  ?Procedure Laterality Date  ? BREATH TEK H PYLORI N/A 11/09/2014  ? Procedure: BREATH TEK H PYLORI;  Surgeon: Greer Pickerel, MD;  Location: [facility];  Service: General;  Laterality: N/A;  ? CARDIAC CATHETERIZATION  ~ 2006; 09/2013  ? CARDIAC CATHETERIZATION N/A 05/23/2016  ? Procedure: Right Heart Cath;  Surgeon: Larey Dresser, MD;  Location: [place] [facility];  Service: Cardiovascular;  Laterality: N/A;  ? CARDIAC DEFIBRILLATOR PLACEMENT  2006; 12/26/2013  ? Medtronic [id number]; 12-2013 ICD gen change and RV lead revision with new 6935 RV lead by Dr [name]  ? CENTRAL LINE INSERTION  08/28/2021  ? Procedure: CENTRAL LINE INSERTION;  Surgeon: Larey Dresser, MD;  Location: [place] [facility];  Service: Cardiovascular;;  ? [place]  ? IMPLANTABLE CARDIOVERTER DEFIBRILLATOR GENERATOR CHANGE N/A 12/26/2013  ? Procedure: IMPLANTABLE CARDIOVERTER DEFIBRILLATOR GENERATOR CHANGE;  Surgeon: Deboraha Sprang, MD;  Location: [facility] CATH LAB;  Service: Cardiovascular;  Laterality: N/A;  ? INSERTION OF IMPLANTABLE LEFT VENTRICULAR ASSIST DEVICE N/A 09/03/2021  ? Procedure: INSERTION OF IMPLANTABLE LEFT VENTRICULAR ASSIST DEVICE/ Heartmate 3;  Surgeon: Dahlia Byes, MD;  Location: [place];  Service: Open Heart Surgery;  Laterality: N/A;  ? IR CT HEAD LTD  04/17/2020  ? IR CT HEAD LTD   04/17/2020  ? IR INTRA CRAN STENT  04/17/2020  ? IR PERCUTANEOUS ART THROMBECTOMY/INFUSION INTRACRANIAL INC DIAG ANGIO  04/17/2020  ? IR RADIOLOGIST EVAL & MGMT  06/05/2020  ? LEAD REVISION N/A 12/26/2013  ? Procedure: LEAD REVISION;  Surgeon: Deboraha Sprang, MD;  Location: [facility] CATH LAB;  Service: Cardiovascular;  Laterality: N/A;  ? PLACEMENT OF IMPELLA LEFT VENTRICULAR ASSIST DEVICE Right 08/29/2021  ? Procedure: PLACEMENT OF IMPELLA 5.5 LEFT VENTRICULAR ASSIST DEVICE;  Surgeon: Gaye Pollack, MD;  Location: [place];  Service: Open Heart Surgery;  Laterality: Right;  ? RADIOLOGY WITH ANESTHESIA N/A 04/17/2020  ? Procedure: Code Stroke;  Surgeon: Luanne Bras, MD;  Location: [place];  Service: Radiology;  Laterality: N/A;  ? REMOVAL OF IMPELLA LEFT VENTRICULAR ASSIST DEVICE N/A 09/03/2021  ? Procedure: REMOVAL OF IMPELLA LEFT VENTRICULAR ASSIST DEVICE;  Surgeon: Dahlia Byes, MD;  Location: [place];  Service: Open Heart Surgery;  Laterality: N/A;  ? RIGHT HEART CATH N/A 08/28/2021  ? Procedure: RIGHT HEART CATH;  Surgeon: Larey Dresser, MD;  Location: [place] [facility];  Service: Cardiovascular;  Laterality: N/A;  ? RIGHT HEART CATHETERIZATION N/A 02/15/2014  ? Procedure: RIGHT HEART CATH;  Surgeon: Larey Dresser, MD;  Location: [facility] CATH LAB;  Service: Cardiovascular;  Laterality: N/A;  ? TEE WITHOUT CARDIOVERSION N/A 08/29/2021  ? Procedure: TRANSESOPHAGEAL ECHOCARDIOGRAM (TEE);  Surgeon: Gaye Pollack, MD;  Location: [place];  Service: Open Heart Surgery;  Laterality: N/A;  ? TEE WITHOUT CARDIOVERSION N/A 09/03/2021  ? Procedure: TRANSESOPHAGEAL ECHOCARDIOGRAM (TEE);  Surgeon: Dahlia Byes, MD;  Location: [place];  Service: Open Heart Surgery;  Laterality: N/A;  ? TUBAL LIGATION  1999  ? ?Family History  ?Problem Relation Age of Onset  ? Heart disease Maternal Grandmother   ? Heart disease Mother   ? Obesity Mother   ? High blood pressure Father   ? Stroke Father   ? ?Social History:  reports that she has never  smoked. She has never used smokeless tobacco. She reports that she does not drink alcohol and does not use drugs. ?Allergies: No Known Allergies ?Medications Prior to Admission  ?Medication Sig Dispense Refill  ? acetamin

## 2021-09-20 NOTE — Progress Notes (Signed)
HeartMate 3 Rounding Note ? ?Postop day  #17 HeartMate 3 Implantation March 14 ? ?Subjective:   ?Patient maintaining sinus rhythm  on po amiodarone,  ?Lasix drip off, edema much improved ?Ramp Echo study- speed up to 5800 rpm ?Appetite improving daily- transition to HS tube feeds ?Incisions healing, chest tube sutures removed today. ?Patient feeling much better and stronger ?Sutures removed from old Impella site R upper chest with drainage of old hematoma. Change dressing PRN ?Plan is for CIR transfer today. ? ? ?LVAD INTERROGATION:  ?HeartMate 3LVAD:  Flow 4.9 liters/min, speed 5800, power 4.8 PI 3.0.  Controller  intact.  ? ?Objective:   ? ?Vital Signs:   ?Temp:  [97.6 ?F (36.4 ?C)-98.9 ?F (37.2 ?C)] 97.6 ?F (36.4 ?C) (03/31 0720) ?Pulse Rate:  [87-89] 87 (03/31 0314) ?Resp:  [16-21] 16 (03/31 0720) ?BP: (70-110)/(37-92) 88/69 (03/31 0800) ?SpO2:  [96 %-98 %] 96 % (03/31 0805) ?Arterial Line BP: (88-94)/(66-74) 88/66 (03/31 0305) ?Weight:  [100.3 kg] 100.3 kg (03/31 0437) ?Last BM Date : 09/17/21 ?Mean arterial Pressure 70-80 mm Hg ? ?Intake/Output:  ? ?Intake/Output Summary (Last 24 hours) at 09/20/2021 0946 ?Last data filed at 09/20/2021 6767 ?Gross per 24 hour  ?Intake 2247.54 ml  ?Output 1750 ml  ?Net 497.54 ml  ?  ? ?Physical Exam: ?General:  Well appearing. No resp difficulty, mild tachypnea. Sternal incision dry ?HEENT: normal ?Neck: supple. JVP . 2+ carotids  no bruits. No lymphadenopathy or thryomegaly appreciated. L neck drain for Impella 5.5 out and incision healing ?Cor: Mechanical heart sounds with LVAD hum present. ?Lungs: clear ?Abdomen: soft, nontender, nondistended. No hepatosplenomegaly. No bruits or masses. Good bowel sounds..  Persistent serous drainage around power cord exit site, daily dressing changes to continue ?Extremities: no cyanosis, clubbing, rash, edema ?Neuro: alert & orientedx3, cranial nerves grossly intact. moves all 4 extremities w/o difficulty. Affect pleasant ? ?Telemetry: sinus  tach ? ?Labs: ?Basic Metabolic Panel: ?Recent Labs  ?Lab 09/13/21 ?1436 09/14/21 ?0204 09/14/21 ?0431 09/14/21 ?2094 09/14/21 ?1629 09/15/21 ?0320 09/16/21 ?0500 09/16/21 ?1430 09/17/21 ?0140 09/17/21 ?1435 09/18/21 ?0357 09/18/21 ?1418 09/19/21 ?7096 09/19/21 ?1300 09/19/21 ?1704 09/20/21 ?0259  ?NA 137   < > 137   < > 136   < > 137   < > 138   < > 141 140 143 143 140 140  ?K 3.7  3.8   < > 4.0   < > 3.4*   < > 2.2*   < > 3.3*   < > 2.7* 4.9 2.9* 3.0* 5.6* 5.4*  ?CL 91*   < > 91*   < > 88*   < > 84*   < > 92*   < > 97* 102 105 104 106 107  ?CO2 35*   < > 38*   < > 38*   < > 42*   < > 38*   < > 35* '31 30 31 28 28  '$ ?GLUCOSE 110*   < > 119*   < > 141*   < > 113*   < > 103*   < > 111* 188* 127* 89 92 110*  ?BUN 38*   < > 39*   < > 43*   < > 55*   < > 58*   < > 49* 43* 45* 44* 38* 40*  ?CREATININE 1.33*   < > 1.28*   < > 1.23*   < > 1.16*   < > 1.04*   < > 1.01* 0.94 0.95 0.95 0.99 1.00  ?  CALCIUM 9.3   < > 9.2   < > 9.1   < > 8.9   < > 9.0   < > 8.7* 8.5* 8.7* 8.5* 8.8* 9.1  ?MG 2.0  --  1.9  --  2.6*   < > 2.0  --  2.2  --  2.3  --  2.4  --   --  2.2  ?PHOS 3.5  --  2.9  --  3.1  --   --   --   --   --   --   --   --   --   --   --   ? < > = values in this interval not displayed.  ? ? ?Liver Function Tests: ?No results for input(s): AST, ALT, ALKPHOS, BILITOT, PROT, ALBUMIN in the last 168 hours. ? ?No results for input(s): LIPASE, AMYLASE in the last 168 hours. ?No results for input(s): AMMONIA in the last 168 hours. ? ?CBC: ?Recent Labs  ?Lab 09/16/21 ?0500 09/17/21 ?0140 09/18/21 ?0357 09/19/21 ?5027 09/20/21 ?0259  ?WBC 27.1* 24.1* 17.9* 15.8* 16.1*  ?NEUTROABS 23.2* 20.0* 14.2* 12.4* 12.1*  ?HGB 8.9* 9.4* 8.6* 8.7* 9.2*  ?HCT 29.0* 32.0* 29.6* 30.7* 31.3*  ?MCV 93.2 95.8 96.7 99.4 97.2  ?PLT 395 355 299 276 271  ? ? ?INR: ?Recent Labs  ?Lab 09/16/21 ?0500 09/17/21 ?0140 09/18/21 ?0357 09/19/21 ?7412 09/20/21 ?0259  ?INR 2.0* 1.8* 2.0* 2.2* 2.4*  ? ? ?Other results: ?EKG:  ? ?Imaging: ?ECHOCARDIOGRAM  LIMITED ? ?Result Date: 09/18/2021 ?   ECHOCARDIOGRAM LIMITED REPORT   Patient Name:   [name] Date of Exam: 09/18/2021 Medical Rec #:  [id number]    Height:       63.0 in Accession #:    [id number]   Weight:       219.8 lb Date of Birth:  1970-10-18    BSA:          2.013 m? Patient Age:    51 years     BP:           0/0 mmHg Patient Gender: F            HR:           61 bpm. Exam Location:  Inpatient Procedure: Limited Echo, Color Doppler and Cardiac Doppler Indications:    LVAD RAMP  History:        Patient has prior history of Echocardiogram examinations, most                 recent 09/11/2021. CHF, Defibrillator; Pulmonary HTN.  Sonographer:    Raquel Sarna Senior RDCS Referring Phys: [phone] [place]  1. 5600 RPM     The septum is: neutral with leftward septal shift with diastole     There is no suck down.     Right ventricle is mildly dilated with mildly reduced systolic function.     The aortic valve: opens every cycle     Aortic regurgitation: mild-moderate     Mitral regurgitation: mild     Tricuspid regurgitation: moderate-severe         Inflow cannula is not visualized.     Outflow cannula is not visualized.         LVAD ramp study:     5700 RPM     The septum is: neutral with leftward septal shift with diastole     There is no suck down.     Right ventricle is  mildly dilated with mildly reduced systolic function.     The aortic valve: opens every cycle     Aortic regurgitation: mild-moderate     Mitral regurgitation: mild     Tricuspid regurgitation: moderate-severe         5800 RPM     The septum is: neutral with reduced leftward septal shift in diastole.     There is no suck down.     Right ventricle is mildly dilated with mildly reduced systolic function.     The aortic valve: opens every cycle     Aortic regurgitation: mild-moderate     Mitral regurgitation: mild     Tricuspid regurgitation: mild-moderate. FINDINGS  Left Ventricular Assist Device: 9798 RPM The septum is: neutral with  leftward septal shift with diastole There is no suck down. Right ventricle is mildly dilated with mildly reduced systolic function. The aortic valve: opens every cycle Aortic regurgitation: mild-moderate Mitral regurgitation: mild Tricuspid regurgitation: moderate-severe Inflow cannula is not visualized. Outflow cannula is not visualized. LVAD ramp study: 5700 RPM The septum is: neutral with leftward septal shift with diastole There is no suck down. Right ventricle is mildly dilated with mildly reduced systolic function. The aortic valve: opens every cycle Aortic regurgitation: mild-moderate Mitral regurgitation: mild Tricuspid regurgitation: moderate-severe  5800 RPM The septum is: neutral with reduced leftward septal shift in diastole. There is no suck down. Right ventricle is mildly dilated with mildly reduced systolic function. The aortic valve: opens every cycle Aortic regurgitation: mild-moderate Mitral regurgitation: mild Tricuspid regurgitation: mild-moderate.  Additional Comments: A device lead is visualized in the right atrium and right ventricle. Cherlynn Kaiser MD Electronically signed by Cherlynn Kaiser MD Signature Date/Time: 09/18/2021/7:02:31 PM    Final    ? ? ?Medications:   ? ? ?Scheduled Medications: ? amiodarone  400 mg Oral BID  ? aspirin EC  81 mg Oral Daily  ? Chlorhexidine Gluconate Cloth  6 each Topical Daily  ? digoxin  0.0625 mg Oral Daily  ? insulin aspart  0-24 Units Subcutaneous TID WC & HS  ? methimazole  10 mg Oral Daily  ? methimazole  5 mg Oral QPM  ? midodrine  5 mg Oral TID WC  ? mirtazapine  7.5 mg Oral QHS  ? pantoprazole  40 mg Oral Daily  ? predniSONE  20 mg Oral Q breakfast  ? quiniDINE gluconate  324 mg Oral BID  ? rosuvastatin  40 mg Oral QHS  ? sildenafil  20 mg Oral TID  ? sodium chloride flush  10-40 mL Intracatheter Q12H  ? torsemide  80 mg Oral Daily  ? warfarin  3 mg Oral q1600  ? Warfarin - Pharmacist Dosing Inpatient   Does not apply X2119  ? ? ?Infusions: ? sodium  chloride    ? sodium chloride Stopped (09/19/21 2034)  ? sodium chloride    ? feeding supplement (PIVOT 1.5 CAL) Stopped (09/20/21 4174)  ? lactated ringers    ? lactated ringers Stopped (09/05/21 1010)  ? ? ?PRN Medicat

## 2021-09-20 NOTE — Progress Notes (Signed)
Signed    ? ?   ?   ?   ?   ?   ?   ?   ?   ?   ?   ?   ?   ?   ?   ?   ?   ?   ?   ?   ?   ?   ?   ?   ?   ?   ?   ?   ?   ?   ?   ?   ?   ?   ?   ?   ?   ?   ?   ?   ?   ?   ?   ?   ?   ?   ?   ?   ?   ?   ?   ?   ?   ?   ?   ?   ?   ?   ?   ?   ?   ?   ?   ?   ?   ?   ?   ?   ?   ?   ?   ?   ?   ?   ?   ?   ?   ?   ?   ?   ?   ?   ?   ?   ?   ?   ?   ?   ?   ?   ?   ?   ?   ?   ?   ?   ?   ?   ?   ?   ?   ?   ?   ?   ?   ?   ?   ?   ?   ?   ?   ?   ?   ?   ?   ?   ?   ?   ?   ?   ?   ?   ?   ?   ?   ?   ?   ?   ?   ?   ?   ?   ?   ?   ?   ?   ?   ?   ?   ?   ?   ?   ?   ?   ?   ?   ?PMR Admission Coordinator Pre-Admission Assessment ?  ?Patient: [name] is an 51 y.o., female ?MRN: [id number] ?DOB: 1971-04-26 ?Height: '5\' 3"'$  (160 cm) ?Weight: 100.3 kg ?  ?Insurance Information ?HMO:     PPO: yes     PCP:      IPA:      80/20:      OTHER:  ?PRIMARY: [employer] [place]      Policy#: [id number]      Subscriber: spouse ?CM Name: approved via fax      Phone#: [phone]     Fax#: [phone] ?Pre-Cert#: [id number] approved until 10/09/21      Employer: [employer] ?Benefits:  Phone #: [phone]     Name: 3/29 ?Eff. Date: 06/23/2021     Deduct: $500      Out of Pocket Max: $1500      Life Max: none ?CIR: 90%      SNF: 90% 100 days  per year ?Outpatient: 90%     Co-Pay: 60 combined visits ?Home Health: 90%      Co-Pay: 100 combined visits ?DME: 90%     Co-Pay: 10% ?Providers: in network ? ?SECONDARY: Medicare a and b      Policy#: [id number] per passport one source a active 11/21/2009 and b 07/24/2012 ?  ?Financial Counselor:       Phone#:  ?  ?The ?Data Collection Information Summary? for patients in Inpatient Rehabilitation Facilities with attached ?Privacy Act [place] Records? was provided and verbally reviewed with: Patient and Family ?  ?Emergency Contact Information ?Contact Information   ?  ?  Name Relation Home Work Mobile  ?  Armine, Rizzolo Spouse [phone]   [phone]  ?  Milroy,Desiree Daughter  [phone]   [phone]  ?  ?   ?  ?  ?Current Medical History  ?Patient Admitting Diagnosis: debility, acute on chronic systolic Heart failure s/p LVAD placement ?  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 Presented on 08/25/2021 with worsening SOB, fatigue, nausea chest tightness and palpitations. Admitted with volume overload and low output.   ?  ?Placed on milrinone and lasix IV. Felt hypothyroidism playing a role in her CHF exacerbation. Right Heart Cath on 3/8 showed elevated filling pressures, moderate mixed pulmonary venous/pulmonary arterial hypertension and low output. Impella placed 3/9.Heart mate III LVAD placement on 3/14/203 . Now off pressors and on po Torsemide and diamox. Weight down 30 lbs from pre op. Placed on Warfarin and dosing per pharmacy. Continue Midodrine.  BMI out of range for heart transplant.  ?  ?Has history of hypothyroidism thought to be associated with amiodarone. NSVT on milrinone makes it difficult to stop. Began solumedrol IV and transitioned to prednisone and methimazole. Will need outpatient endocrinology follow up. Continue amiodarone for now with NSVT and rapid atrial fibrillation. ?  ? Elevated WBC's felt due to steroids, not infection. Urine culture felt likely colonized. Completed Vancomycin and Cefazolin. As well as empiric cefepime. Malnutrition with prealbumin 13. Remeron added to stimulate appetite, now with TF via cortrak.  ?  ?Patient's medical record from [facility] has been reviewed by the rehabilitation admission coordinator and physician. ?  ?Past Medical History  ?    ?Past Medical History:  ?Diagnosis Date  ? Automatic implantable cardioverter-defibrillator in situ    ? Chronic CHF (congestive heart failure) (HCC)    ?  a. EF 15-20% b. RHC (09/2013) RA 14, RV 57/22, PA 64/36 (48), PCWP 18,  FIck CO/CI 3.7/1.6, PVR 8.1 WU, PA sat 47%   ? History of stomach ulcers    ? Hypertension    ? Hypotension    ? Hypothyroidism    ? Morbid obesity ([place])    ? Myocardial infarction [facility]) 08/2013  ? Nocturnal dyspnea    ? Nonischemic cardiomyopathy ([place])    ? Sinus tachycardia    ? Sleep apnea    ? Snoring-prob OSA 09/04/2011  ? [employer] ICD lead RECALL  [phone]    ? UARS (upper airway resistance syndrome) 09/04/2011  ?  HST 12/2013:  AHI 4/hr (numerous episodes of airflow reduction that did not have concomitant desaturation)   ?  ?Has the patient had major surgery during 100 days prior to admission? Yes ?  ?Family History   ?family history includes  Heart disease in her maternal grandmother and mother; High blood pressure in her father; Obesity in her mother; Stroke in her father. ?  ?Current Medications ?  ?Current Facility-Administered Medications:  ?  0.9 %  sodium chloride infusion, 250 mL, Intravenous, Continuous, Joette Catching, [place] ?  0.9 %  sodium chloride infusion, , Intravenous, Continuous, Joette Catching, [place], Stopped at 09/19/21 2034 ?  Place/Maintain arterial line, , , Until Discontinued **AND** 0.9 %  sodium chloride infusion, , Intra-arterial, PRN, Joette Catching, PA-C ?  ALPRAZolam Duanne Moron) tablet 0.25 mg, 0.25 mg, Oral, TID PRN, Joette Catching, PA-C, 0.25 mg at 09/15/21 1650 ?  amiodarone (PACERONE) tablet 400 mg, 400 mg, Oral, BID, Larey Dresser, MD, 400 mg at 09/20/21 9629 ?  aspirin EC tablet 81 mg, 81 mg, Oral, Daily, Joette Catching, [place], 81 mg at 09/20/21 5284 ?  Chlorhexidine Gluconate Cloth 2 % PADS 6 each, 6 each, Topical, Daily, Joette Catching, [place], 6 each at 09/20/21 1324 ?  digoxin (LANOXIN) tablet 0.0625 mg, 0.0625 mg, Oral, Daily, Joette Catching, PA-C, 0.0625 mg at 09/20/21 0940 ?  feeding supplement (PIVOT 1.5 CAL) liquid 840 mL, 840 mL, Per Tube, Continuous, Larey Dresser, MD, Stopped at 09/20/21 [phone] ?  gabapentin (NEURONTIN)  capsule 100 mg, 100 mg, Oral, TID PRN, Larey Dresser, MD ?  CBG monitoring, , , 4x Daily, AC & HS **AND** insulin aspart (novoLOG) injection 0-24 Units, 0-24 Units, Subcutaneous, TID WC & HS, Joette Catching, PA-C, 2 Units at 09/18/21 0940 ?  lactated ringers infusion, , Intravenous, Continuous, Joette Catching, PA-C ?  lactated ringers infusion, , Intravenous, Continuous, Joette Catching, [place], Stopped at 09/05/21 1010 ?  loperamide HCl (IMODIUM) 1 MG/7.5ML suspension 1 mg, 1 mg, Oral, TID PRN, Joette Catching, PA-C, 1 mg at 09/17/21 2725 ?  methimazole (TAPAZOLE) tablet 10 mg, 10 mg, Oral, Daily, Joette Catching, [place], 10 mg at 09/20/21 3664 ?  methimazole (TAPAZOLE) tablet 5 mg, 5 mg, Oral, QPM, Joette Catching, PA-C, 5 mg at 09/19/21 1627 ?  midodrine (PROAMATINE) tablet 5 mg, 5 mg, Oral, TID WC, Croitoru, Mihai, MD, 5 mg at 09/20/21 0941 ?  mirtazapine (REMERON) tablet 7.5 mg, 7.5 mg, Oral, QHS, Joette Catching, PA-C, 7.5 mg at 09/19/21 2216 ?  ondansetron [facility]) injection 4 mg, 4 mg, Intravenous, Q6H PRN, Joette Catching, PA-C ?  oxyCODONE (Oxy IR/ROXICODONE) immediate release tablet 5-10 mg, 5-10 mg, Oral, Q3H PRN, Joette Catching, PA-C, 5 mg at 09/20/21 4034 ?  pantoprazole (PROTONIX) EC tablet 40 mg, 40 mg, Oral, Daily, Joette Catching, [place], 40 mg at 09/20/21 7425 ?  predniSONE (DELTASONE) tablet 20 mg, 20 mg, Oral, Q breakfast, Joette Catching, [place], 20 mg at 09/20/21 9563 ?  quiniDINE gluconate CR tablet 324 mg, 324 mg, Oral, BID, Joette Catching, PA-C, 324 mg at 09/19/21 2217 ?  rosuvastatin (CRESTOR) tablet 40 mg, 40 mg, Oral, QHS, Joette Catching, [place], 40 mg at 09/19/21 2216 ?  senna-docusate (Senokot-S) tablet 2 tablet, 2 tablet, Oral, BID PRN, Joette Catching, PA-C ?  sildenafil (REVATIO) tablet 20 mg, 20 mg, Oral, TID, Joette Catching, PA-C, 20 mg at 09/20/21 8756 ?  sodium chloride (OCEAN) 0.65 % nasal  spray 1 spray, 1 spray, Each Nare, PRN, Joette Catching, PA-C, 1 spray at 09/19/21 0827 ?  sodium chloride flush (NS) 0.9 % injection 10-40 mL, 10-40 mL, Intracatheter, Q12H,  Joette Catching, PA-C, 10 mL

## 2021-09-20 NOTE — H&P (Signed)
?  ?Physical Medicine and Rehabilitation Admission H&P ?  ?  ?    ?Chief Complaint  ?Patient presents with  ? Shortness of Breath  ? Emesis  ? Palpitations  ?      ?   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  Per chart review patient lives with spouse.  Two-level home 3 steps to entry.  Reportedly independent prior to admission no assistive device but was limited by occasional shortness of breath.  Presented 08/25/2021 with increasing shortness of breath as well as fatigue with occasional nausea and palpitations.  She also reports nonproductive cough x1 month.  Denied any fever or sore throat.  She did see her primary cardiology PA on 08/21/2021 who recommended she take metolazone given concern for possible volume overload.  In the ED heart rate 116-130, BP 92-1 19/59-83, respiratory 25 saturating 100% on room air, BNP 1117, troponin 27-29.  Chest x-ray showed cardiomegaly without pulmonary edema.  CT for pulmonary emboli was negative.  EKG showed sinus tachycardia with left bundle branch block.  Echocardiogram with ejection fraction less than 20%.  No thrombus noted.  Cardiology follow-up milrinone was initiated.  Patient underwent insertion of implantable (LVAD) left ventricular assistive device 09/03/2021 per Dr. Darcey Nora.  Postoperative anemia receiving 1 unit packed red blood cells with latest hemoglobin 9.2.  She was slowly weaned off pressors as well as milrinone.   She continued to undergo diuresis.  Bouts of atrial fibrillation continued on p.o. amiodarone as well as digoxin.  Patient has been placed on Coumadin as well as remains on low-dose aspirin for history of CVA.Marland Kitchen  Hospital course incidental finding of left ovarian mass noted on CT abdomen pelvis.  Unable to get MRI due to ICD.  Seen by GYN felt to be most likely nonneoplastic.  Tumor markers were within normal limits.  No ascites noted.  Patient not a surgical candidate at this time follow-up outpatient.  She did have some leukocytosis 30,100 suspect due to steroids that she had been placed on for hyperthyroidism no evidence for infection urine culture 50,000 gram-positive cocci.  Final culture with Enterococcus faecalis likely colonized completing course of vancomycin and cefazolin.  Decreased nutritional storage her diet has been advanced to regular she was placed on Remeron to help stimulate appetite.  She was requiring 12-hour tube feeds for nutritional supplement.  Palliative care was consulted to establish goals of care.  Therapy evaluations completed due to patient decreased functional mobility was admitted for a comprehensive rehab program. ?  ?Review of Systems  ?Constitutional:  Positive for malaise/fatigue. Negative for chills and fever.  ?HENT:  Negative for hearing loss.   ?Eyes:  Negative for blurred vision and double vision.  ?Respiratory:  Positive for cough and shortness of breath. Negative for wheezing.   ?Cardiovascular:  Positive for chest pain, palpitations and leg swelling.  ?Gastrointestinal:  Positive for constipation and nausea.  ?Genitourinary:  Negative for dysuria, flank pain and hematuria.  ?Musculoskeletal:  Positive for joint pain and myalgias.  ?Skin:  Negative for  rash.  ?Neurological:  Positive for weakness.  ?All other systems reviewed and are negative. ?    ?Past Medical History:  ?Diagnosis Date  ? Automatic implantable cardioverter-defibrillator in situ    ? Chronic CHF  (congestive heart failure) (HCC)    ?  a. EF 15-20% b. RHC (09/2013) RA 14, RV 57/22, PA 64/36 (48), PCWP 18, FIck CO/CI 3.7/1.6, PVR 8.1 WU, PA sat 47%   ? History of stomach ulcers    ? Hypertension    ? Hypotension    ? Hypothyroidism    ? Morbid obesity ([place])    ? Myocardial infarction [facility]) 08/2013  ? Nocturnal dyspnea    ? Nonischemic cardiomyopathy ([place])    ? Sinus tachycardia    ? Sleep apnea    ? Snoring-prob OSA 09/04/2011  ? Sprint Fidelis ICD lead RECALL  [phone]    ? UARS (upper airway resistance syndrome) 09/04/2011  ?  HST 12/2013:  AHI 4/hr (numerous episodes of airflow reduction that did not have concomitant desaturation)   ?  ?     ?Past Surgical History:  ?Procedure Laterality Date  ? BREATH TEK H PYLORI N/A 11/09/2014  ?  Procedure: BREATH TEK H PYLORI;  Surgeon: Greer Pickerel, MD;  Location: [facility];  Service: General;  Laterality: N/A;  ? CARDIAC CATHETERIZATION   ~ 2006; 09/2013  ? CARDIAC CATHETERIZATION N/A 05/23/2016  ?  Procedure: Right Heart Cath;  Surgeon: Larey Dresser, MD;  Location: [place] CV LAB;  Service: Cardiovascular;  Laterality: N/A;  ? CARDIAC DEFIBRILLATOR PLACEMENT   2006; 12/26/2013  ?  Medtronic [id number]; 12-2013 ICD gen change and RV lead revision with new 6935 RV lead by Dr [name]  ? CENTRAL LINE INSERTION   08/28/2021  ?  Procedure: CENTRAL LINE INSERTION;  Surgeon: Larey Dresser, MD;  Location: [place] CV LAB;  Service: Cardiovascular;;  ? [place]  ? IMPLANTABLE CARDIOVERTER DEFIBRILLATOR GENERATOR CHANGE N/A 12/26/2013  ?  Procedure: IMPLANTABLE CARDIOVERTER DEFIBRILLATOR GENERATOR CHANGE;  Surgeon: Deboraha Sprang, MD;  Location: [facility] CATH LAB;  Service: Cardiovascular;  Laterality: N/A;  ? INSERTION OF IMPLANTABLE LEFT VENTRICULAR ASSIST DEVICE N/A 09/03/2021  ?  Procedure: INSERTION OF IMPLANTABLE LEFT VENTRICULAR ASSIST DEVICE/ Heartmate 3;  Surgeon: Dahlia Byes, MD;  Location: [place];  Service: Open Heart Surgery;  Laterality: N/A;  ? IR CT  HEAD LTD   04/17/2020  ? IR CT HEAD LTD   04/17/2020  ? IR INTRA CRAN STENT   04/17/2020  ? IR PERCUTANEOUS ART THROMBECTOMY/INFUSION INTRACRANIAL INC DIAG ANGIO   04/17/2020  ? IR RADIOLOGIST EVAL & MGMT   06/05/2020  ? LEAD REVISION N/A 12/26/2013  ?  Procedure: LEAD REVISION;  Surgeon: Deboraha Sprang, MD;  Location: [facility] CATH LAB;  Service: Cardiovascular;  Laterality: N/A;  ? PLACEMENT OF IMPELLA LEFT VENTRICULAR ASSIST DEVICE Right 08/29/2021  ?  Procedure: PLACEMENT OF IMPELLA 5.5 LEFT VENTRICULAR ASSIST DEVICE;  Surgeon: Gaye Pollack, MD;  Location: [place];  Service: Open Heart Surgery;  Laterality: Right;  ? RADIOLOGY WITH ANESTHESIA N/A 04/17/2020  ?  Procedure: Code Stroke;  Surgeon: Luanne Bras, MD;  Location: [place];  Service: Radiology;  Laterality: N/A;  ? REMOVAL OF IMPELLA LEFT VENTRICULAR ASSIST DEVICE N/A 09/03/2021  ?  Procedure: REMOVAL OF IMPELLA LEFT VENTRICULAR ASSIST DEVICE;  Surgeon: Dahlia Byes, MD;  Location: [place];  Service: Open Heart Surgery;  Laterality: N/A;  ? RIGHT HEART CATH  N/A 08/28/2021  ?  Procedure: RIGHT HEART CATH;  Surgeon: Larey Dresser, MD;  Location: [place] CV LAB;  Service: Cardiovascular;  Laterality: N/A;  ? RIGHT HEART CATHETERIZATION N/A 02/15/2014  ?  Procedure: RIGHT HEART CATH;  Surgeon: Larey Dresser, MD;  Location: [facility] CATH LAB;  Service: Cardiovascular;  Laterality: N/A;  ? TEE WITHOUT CARDIOVERSION N/A 08/29/2021  ?  Procedure: TRANSESOPHAGEAL ECHOCARDIOGRAM (TEE);  Surgeon: Gaye Pollack, MD;  Location: [place];  Service: Open Heart Surgery;  Laterality: N/A;  ? TEE WITHOUT CARDIOVERSION N/A 09/03/2021  ?  Procedure: TRANSESOPHAGEAL ECHOCARDIOGRAM (TEE);  Surgeon: Dahlia Byes, MD;  Location: [place];  Service: Open Heart Surgery;  Laterality: N/A;  ? TUBAL LIGATION   1999  ?  ?     ?Family History  ?Problem Relation Age of Onset  ? Heart disease Maternal Grandmother    ? Heart disease Mother    ? Obesity Mother    ? High blood pressure Father    ?  Stroke Father    ?  ?Social History:  reports that she has never smoked. She has never used smokeless tobacco. She reports that she does not drink alcohol and does not use drugs. ?Allergies: No Known Allergies ?

## 2021-09-20 NOTE — Progress Notes (Signed)
Inpatient Rehabilitation Admission Medication Review by a Pharmacist ? ?A complete drug regimen review was completed for this patient to identify any potential clinically significant medication issues. ? ?High Risk Drug Classes Is patient taking? Indication by Medication  ?Antipsychotic No   ?Anticoagulant Yes Warfarin for LVAD  ?Antibiotic No   ?Opioid Yes Tramadol prn pain  ?Antiplatelet No   ?Hypoglycemics/insulin Yes SSI for glucose control (on prednisone)  ?Vasoactive Medication Yes Amiodarone, carvedilol, digoxin, midodrine, quinidine, sildenafil, torsemide for NSVT, CHF  ?Chemotherapy No   ?Other Yes Methimazole, prednisone for hyperthyroidism ?Rosuvastatin for HLD ?Remeron for mood, sleep  ? ? ? ?Type of Medication Issue Identified Description of Issue Recommendation(s)  ?Drug Interaction(s) (clinically significant) ?    ?Duplicate Therapy ?    ?Allergy ?    ?No Medication Administration End Date ?    ?Incorrect Dose ?    ?Additional Drug Therapy Needed ?    ?Significant med changes from prior encounter (inform family/care partners about these prior to discharge).    ?Other ?    ? ? ?Clinically significant medication issues were identified that warrant physician communication and completion of prescribed/recommended actions by midnight of the next day:  No ? ?Pharmacist comments: none  ? ?Time spent performing this drug regimen review (minutes):  20 minutes ? ? ?Tad Moore ?09/20/2021 2:28 PM ?

## 2021-09-20 NOTE — Progress Notes (Signed)
Transferred to [facility] room 14 by bed awake and alert. Report given to Rn. Belongings with pt. ?

## 2021-09-20 NOTE — Progress Notes (Signed)
ANTICOAGULATION CONSULT NOTE - Consult ? ?Pharmacy Consult for warfarin ?Indication:  LVAD HM3 ? ?No Known Allergies ? ?Patient Measurements: ?Height: '5\' 3"'$  (160 cm) ?Weight: 100.3 kg (221 lb 1.9 oz) ?IBW/kg (Calculated) : 52.4 ? ?Vital Signs: ?Temp: 97.6 ?F (36.4 ?C) (03/31 0720) ?Temp Source: Oral (03/31 0720) ?BP: 80/37 (03/31 0720) ?Pulse Rate: 87 (03/31 0314) ? ?Labs: ?Recent Labs  ?  09/18/21 ?0357 09/18/21 ?1418 09/19/21 ?6546 09/19/21 ?1300 09/19/21 ?1704 09/20/21 ?0259  ?HGB 8.6*  --  8.7*  --   --  9.2*  ?HCT 29.6*  --  30.7*  --   --  31.3*  ?PLT 299  --  276  --   --  271  ?LABPROT 23.0*  --  24.4*  --   --  26.1*  ?INR 2.0*  --  2.2*  --   --  2.4*  ?CREATININE 1.01*   < > 0.95 0.95 0.99 1.00  ? < > = values in this interval not displayed.  ? ? ? ?Estimated Creatinine Clearance: 76.1 mL/min (by C-G formula based on SCr of 1 mg/dL). ? ? ?Medical History: ?Past Medical History:  ?Diagnosis Date  ? Automatic implantable cardioverter-defibrillator in situ   ? Chronic CHF (congestive heart failure) (HCC)   ? a. EF 15-20% b. RHC (09/2013) RA 14, RV 57/22, PA 64/36 (48), PCWP 18, FIck CO/CI 3.7/1.6, PVR 8.1 WU, PA sat 47%   ? History of stomach ulcers   ? Hypertension   ? Hypotension   ? Hypothyroidism   ? Morbid obesity ([place])   ? Myocardial infarction [facility]) 08/2013  ? Nocturnal dyspnea   ? Nonischemic cardiomyopathy ([place])   ? Sinus tachycardia   ? Sleep apnea   ? Snoring-prob OSA 09/04/2011  ? Sprint Fidelis ICD lead RECALL  [phone]   ? UARS (upper airway resistance syndrome) 09/04/2011  ? HST 12/2013:  AHI 4/hr (numerous episodes of airflow reduction that did not have concomitant desaturation)   ? ? ?Medications:  ?Scheduled:  ? amiodarone  400 mg Oral BID  ? aspirin EC  81 mg Oral Daily  ? Chlorhexidine Gluconate Cloth  6 each Topical Daily  ? digoxin  0.0625 mg Oral Daily  ? insulin aspart  0-24 Units Subcutaneous TID WC & HS  ? methimazole  10 mg Oral Daily  ? methimazole  5 mg Oral QPM  ? midodrine  5 mg Oral  TID WC  ? mirtazapine  7.5 mg Oral QHS  ? pantoprazole  40 mg Oral Daily  ? predniSONE  20 mg Oral Q breakfast  ? quiniDINE gluconate  324 mg Oral BID  ? rosuvastatin  40 mg Oral QHS  ? sildenafil  20 mg Oral TID  ? sodium chloride flush  10-40 mL Intracatheter Q12H  ? torsemide  80 mg Oral Daily  ? warfarin  3 mg Oral q1600  ? Warfarin - Pharmacist Dosing Inpatient   Does not apply [id number]  ? ? ?Assessment: ?51 yo F who is s/p HM3 LVAD placement on 3/14. Patient has been started on warfarin and aspirin. Aspirin '81mg'$  will be continued indefinitely given prior CVA.  ?INR therapeutic in range today at 2.4. Patient has ben eating more since CoreTrack placed on Friday. LDH had been stable 400-500s  ? Hgb stable, plts wnl.  ? ?Goal of Therapy:  ?INR 2-2.5 ?Monitor platelets by anticoagulation protocol: Yes ?  ?Plan:  ?Warfarin 3 mg daily  ?Daily INR and CBC ? ? ? ?Bonnita Nasuti Pharm.D.  CPP, BCPS ?Clinical Pharmacist ?[phone] ?09/20/2021 8:18 AM  ? ? ?Please check [email or web address] for unit-specific pharmacy phone numbers. ? ? ?

## 2021-09-20 NOTE — Progress Notes (Signed)
Inpatient Rehab Admissions Coordinator:  ?There is a bed available for pt in CIR today.  Dr. Darcey Nora and Dr. [name] aware and in agreement. Pt, pt's husband, NSG, and TOC made aware.  ? ?Gayland Curry, MS, CCC-SLP ?Admissions Coordinator ?[phone] ? ?

## 2021-09-20 NOTE — Progress Notes (Signed)
Patient ID: [name], female   DOB: 12/19/1970, 51 y.o.   MRN: [id number] ?HeartMate 2 Rounding Note ? ?Subjective:   ? ?- 3/14: HM3 LVAD placement with removal of Impella 5.5  ?- 3/15: Speed increased to 5300 rpm. Given 1UPRBC. Febrile. Cultures obtained. Extubated.  ?- 3/16 VT paced out. On amio drip. Given 1UPRBCs . Given couple doses of IV lasix.  ?- 3/18 Rapid ALF. Bolused multiple times with amio and drip turned up to 60. Now back in NSR ?- 3/19 VAD Speed increased to 5400 (high PI)  ?- 3/20 Recurrent ALF. Bolused w/ amio. Milrinone discontinued ?- 3/22 Ramp echo, Fixed Speed 5600  ?- 3/28  Transferred out of CCU ?- 3/29 Ramp echo, fixed speed increased to 5800 ? ?Co-ox 67%.  I/Os mildly negative, weight up 1 lb.  CVP still 13-14.  ? ?Remains in NSR now on po amiodarone.  ? ?MAP 80s by arterial line (hard to get by cuff).  ? ?Scr stable at 1.   ? ?K now elevated mildly with aggressive replacement.   ? ?WBC continues to trend down, 27>>24>>17>>15.8>>16K. Afebrile. Remains on prednisone.  ? ?Getting nocturnal TFs via CorTrak. Appetite better.  ? ?Feeling better. No dyspnea, walking in hall.  ? ?LVAD INTERROGATION:  ?HeartMate II LVAD:  Flow 4.9, speed 5800, power 4.6, PI 3.2. No PI events overnight. VAD interrogated personally. Parameters stable. ? ? ?Objective:   ? ?Vital Signs:   ?Temp:  [97.6 ?F (36.4 ?C)-98.9 ?F (37.2 ?C)] 97.6 ?F (36.4 ?C) (03/31 0720) ?Pulse Rate:  [87-89] 87 (03/31 0314) ?Resp:  [16-21] 16 (03/31 0720) ?BP: (70-110)/(37-92) 80/37 (03/31 0720) ?SpO2:  [96 %-98 %] 96 % (03/31 0720) ?Arterial Line BP: (88-94)/(66-74) 88/66 (03/31 0305) ?Weight:  [100.3 kg] 100.3 kg (03/31 0437) ?Last BM Date : 09/17/21 ?Mean arterial Pressure  80s ? ?Intake/Output:  ? ?Intake/Output Summary (Last 24 hours) at 09/20/2021 0741 ?Last data filed at 09/20/2021 5573 ?Gross per 24 hour  ?Intake 2247.54 ml  ?Output 1750 ml  ?Net 497.54 ml  ?  ?Physical Exam: ?CVP 13-14  ?General: Well appearing this am. NAD.   ?HEENT: Normal. ?Neck: Supple, JVP 10-12 cm. Carotids OK.  ?Cardiac:  Mechanical heart sounds with LVAD hum present.  ?Lungs:  CTAB, normal effort.  ?Abdomen:  NT, ND, no HSM. No bruits or masses. +BS  ?LVAD exit site: Well-healed and incorporated. Dressing dry and intact. No erythema or drainage. Stabilization device present and accurately applied. Driveline dressing changed daily per sterile technique. ?Extremities:  Warm and dry. No cyanosis, clubbing, rash,. Trace ankle edema.   ?Neuro:  Alert & oriented x 3. Cranial nerves grossly intact. Moves all 4 extremities w/o difficulty. Affect pleasant   ?  ? ?Telemetry: NSR 70s ? ?Labs: ?Basic Metabolic Panel: ?Recent Labs  ?Lab 09/13/21 ?1436 09/14/21 ?0204 09/14/21 ?0431 09/14/21 ?2202 09/14/21 ?1629 09/15/21 ?0320 09/16/21 ?0500 09/16/21 ?1430 09/17/21 ?0140 09/17/21 ?1435 09/18/21 ?0357 09/18/21 ?1418 09/19/21 ?5427 09/19/21 ?1300 09/19/21 ?1704 09/20/21 ?0259  ?NA 137   < > 137   < > 136   < > 137   < > 138   < > 141 140 143 143 140 140  ?K 3.7  3.8   < > 4.0   < > 3.4*   < > 2.2*   < > 3.3*   < > 2.7* 4.9 2.9* 3.0* 5.6* 5.4*  ?CL 91*   < > 91*   < > 88*   < > 84*   < >  92*   < > 97* 102 105 104 106 107  ?CO2 35*   < > 38*   < > 38*   < > 42*   < > 38*   < > 35* '31 30 31 28 28  '$ ?GLUCOSE 110*   < > 119*   < > 141*   < > 113*   < > 103*   < > 111* 188* 127* 89 92 110*  ?BUN 38*   < > 39*   < > 43*   < > 55*   < > 58*   < > 49* 43* 45* 44* 38* 40*  ?CREATININE 1.33*   < > 1.28*   < > 1.23*   < > 1.16*   < > 1.04*   < > 1.01* 0.94 0.95 0.95 0.99 1.00  ?CALCIUM 9.3   < > 9.2   < > 9.1   < > 8.9   < > 9.0   < > 8.7* 8.5* 8.7* 8.5* 8.8* 9.1  ?MG 2.0  --  1.9  --  2.6*   < > 2.0  --  2.2  --  2.3  --  2.4  --   --  2.2  ?PHOS 3.5  --  2.9  --  3.1  --   --   --   --   --   --   --   --   --   --   --   ? < > = values in this interval not displayed.  ? ? ?Liver Function Tests: ?No results for input(s): AST, ALT, ALKPHOS, BILITOT, PROT, ALBUMIN in the last 168  hours. ? ?No results for input(s): LIPASE, AMYLASE in the last 168 hours. ?No results for input(s): AMMONIA in the last 168 hours. ? ?CBC: ?Recent Labs  ?Lab 09/16/21 ?0500 09/17/21 ?0140 09/18/21 ?0357 09/19/21 ?6811 09/20/21 ?0259  ?WBC 27.1* 24.1* 17.9* 15.8* 16.1*  ?NEUTROABS 23.2* 20.0* 14.2* 12.4* 12.1*  ?HGB 8.9* 9.4* 8.6* 8.7* 9.2*  ?HCT 29.0* 32.0* 29.6* 30.7* 31.3*  ?MCV 93.2 95.8 96.7 99.4 97.2  ?PLT 395 355 299 276 271  ? ? ?INR: ?Recent Labs  ?Lab 09/16/21 ?0500 09/17/21 ?0140 09/18/21 ?0357 09/19/21 ?5726 09/20/21 ?0259  ?INR 2.0* 1.8* 2.0* 2.2* 2.4*  ? ? ?Other results: ? ? ?Imaging: ?ECHOCARDIOGRAM LIMITED ? ?Result Date: 09/18/2021 ?   ECHOCARDIOGRAM LIMITED REPORT   Patient Name:   JEZELLE GULLICK Date of Exam: 09/18/2021 Medical Rec #:  [id number]    Height:       63.0 in Accession #:    [id number]   Weight:       219.8 lb Date of Birth:  02/01/1971    BSA:          2.013 m? Patient Age:    71 years     BP:           0/0 mmHg Patient Gender: F            HR:           61 bpm. Exam Location:  Inpatient Procedure: Limited Echo, Color Doppler and Cardiac Doppler Indications:    LVAD RAMP  History:        Patient has prior history of Echocardiogram examinations, most                 recent 09/11/2021. CHF, Defibrillator; Pulmonary HTN.  Sonographer:    Simla Referring Phys: [place]  CROITORU IMPRESSIONS  1. 5600 RPM     The septum is: neutral with leftward septal shift with diastole     There is no suck down.     Right ventricle is mildly dilated with mildly reduced systolic function.     The aortic valve: opens every cycle     Aortic regurgitation: mild-moderate     Mitral regurgitation: mild     Tricuspid regurgitation: moderate-severe         Inflow cannula is not visualized.     Outflow cannula is not visualized.         LVAD ramp study:     5700 RPM     The septum is: neutral with leftward septal shift with diastole     There is no suck down.     Right ventricle is mildly dilated with  mildly reduced systolic function.     The aortic valve: opens every cycle     Aortic regurgitation: mild-moderate     Mitral regurgitation: mild     Tricuspid regurgitation: moderate-severe         5800 RPM     The septum is: neutral with reduced leftward septal shift in diastole.     There is no suck down.     Right ventricle is mildly dilated with mildly reduced systolic function.     The aortic valve: opens every cycle     Aortic regurgitation: mild-moderate     Mitral regurgitation: mild     Tricuspid regurgitation: mild-moderate. FINDINGS  Left Ventricular Assist Device: 1165 RPM The septum is: neutral with leftward septal shift with diastole There is no suck down. Right ventricle is mildly dilated with mildly reduced systolic function. The aortic valve: opens every cycle Aortic regurgitation: mild-moderate Mitral regurgitation: mild Tricuspid regurgitation: moderate-severe Inflow cannula is not visualized. Outflow cannula is not visualized. LVAD ramp study: 5700 RPM The septum is: neutral with leftward septal shift with diastole There is no suck down. Right ventricle is mildly dilated with mildly reduced systolic function. The aortic valve: opens every cycle Aortic regurgitation: mild-moderate Mitral regurgitation: mild Tricuspid regurgitation: moderate-severe  5800 RPM The septum is: neutral with reduced leftward septal shift in diastole. There is no suck down. Right ventricle is mildly dilated with mildly reduced systolic function. The aortic valve: opens every cycle Aortic regurgitation: mild-moderate Mitral regurgitation: mild Tricuspid regurgitation: mild-moderate.  Additional Comments: A device lead is visualized in the right atrium and right ventricle. Cherlynn Kaiser MD Electronically signed by Cherlynn Kaiser MD Signature Date/Time: 09/18/2021/7:02:31 PM    Final    ? ? ?Medications:   ? ? ?Scheduled Medications: ? amiodarone  400 mg Oral BID  ? aspirin EC  81 mg Oral Daily  ? Chlorhexidine Gluconate  Cloth  6 each Topical Daily  ? digoxin  0.0625 mg Oral Daily  ? insulin aspart  0-24 Units Subcutaneous TID WC & HS  ? methimazole  10 mg Oral Daily  ? methimazole  5 mg Oral QPM  ? midodrine  5 mg Oral TID WC  ? mirtazapine

## 2021-09-20 NOTE — Progress Notes (Signed)
CSW met with patient at bedside on 2C prior to discharge to CIR. Patient shared that she is feeling more positive about the LVAD and has begun to journal her hospital course to help her see her journey throughout the recovery process. She noted that the first few days post op she felt "degraded" and questioned whether or not she had made the right choice. She stated that meeting other LVAD patients and journaling has helped tremendously. She is motivated for improved health and looking forward to coming to the LVAD Support Group. Patient's husband shared that he has already followed up with some of the other patients he met at the support group on Tuesday and was so glad that he attended. CSW provided supportive intervention and will continue to be available through the outpatient clinic. Raquel Sarna, [place], [place] ? ?

## 2021-09-20 NOTE — Progress Notes (Signed)
LVAD Coordinator Rounding Note: ? ?Admitted 08/26/21 due to acute on chronic systolic heart failure. Receiving ongoing treatment for hyperthyroidism with Methimazole and steroids. On 08/29/21 Impella 5.5 placed.  ? ?HM III LVAD implanted on 09/03/21 by Dr Prescott Gum under destination therapy criteria due to elevated BMI.  ? ?Pt sitting up in the bed. Pt just finished working with OT. ? ?WBC 16.1 today; afebrile. Completed Cefepime for positive urine culture. She remains on methimazole and PO Prednisone for hyperthyroidism. Will need endocrinology f/u appt at discharge.  ? ?Pts home equipment is in house and checked in. ? ?Education completed with pt and her husband [name]. [name] performed VAD dressing today with VAD coordinator observing, he required no cues. [name] is checked off to perform dressing changes independently.  ? ?Dr Prescott Gum removed sutures in old impella pocket earlier today. Dressing saturated with old blood. VAD coordinator removed this dressing, site no longer bleeding. 4 x 4 folded and placed over the site. ? ?Pt will be d/c to CIR today per CIR team. ? ?Vital signs: ?Temp: 97.6 ?HR: 85 NSR ?Doppler Pressure: not accurate ?Auto BP: 90/71 (78) ?O2 Sat: 96% on RA  ?Wt: 243.2>244.7>248>242.6>242.5>241.2>238.1>219.8>220.9>221.1  lbs ? ?LVAD interrogation:  ?Speed: 5800 ?Flow: 4.8 ?Power: 4.5 w ?PI: 3.2 ? ?Alarms: none ?Events: none ? ?Hematocrit: 31 ?Fixed speed: 5800 ?Low speed limit: 5500 ? ?Drive Line: Existing VAD dressing removed and site care performed using sterile technique by pts husband Jamie-observed by VAD coordinator. Drive line exit site cleaned with Chlora prep applicators x 2, RINSED WITH SALINE, allowed to dry, and gauze dressing with Silver strip applied. Exit site healing and unincorporated, the velour is fully implanted at exit site. 2 sutures intact. Moderate amount of serosanguinous drainage. No redness, tenderness, foul odor or rash noted. Drive line anchor secure. Pt will need daily  dressing changes using daily kit per VAD coordinator or nurse champion or pts husband [name]. Next dressing change due: 09/21/21.  ? ? ? ? ? ? ?Labs:  ?LDH trend: 391>485>519>534>530>543>621>450 ? ?INR trend: 0.8>1.6>2.9>2.2>2.3>2.0>1.8 ? ?WBC trend: 25.1>29.5>30.7>32.4>25.8>27.1>24.1>16.1 ? ?Hgb trend: 6.5>7.5>8.0>9.6>9.5>9.1>8.9>9.4>9.2 ? ?Anticoagulation Plan: ?-INR Goal: 2.0 - 2.5 ?-ASA Dose: 81 mg  ? ?Blood Products:  ?IntraOp: ? 4 PRBCs ? 4 FFP ? 2 Cryo ? 2 platelets ?DDAVP ? 1,400 cell saver ?Post Op: ?09/03/21: Factor 7 ? 1 PRBC ? 1 Platelet ?09/04/21: 1 PRBC ?09/05/21: 1 PRBC ? ?Device: turned back on 3/29 ?-Medtronic  ?-Therapies: VF on >240 BPM ?        VT on 200-240 BPM ? 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 ?Respiratory: extubated 09/04/21  ? ?Infection: ?08/31/21>> blood cultures>> no growth 5 days; final ?09/04/21>> blood cultures>> no growth 5 days; final ?09/04/21>> sputum culture>> moderate WBC, no organisms seen; rare candida albicans ?09/04/21>> urine culture>> enterococcus faecalis; final ? ?Drips: ?Amiodarone 60 mg/hr-off today ?Lasix 12 mg/hr-off ? ?Patient Education: ?Husband performed dressing change today. VAD coordinator provided very few cues throughout procedure. [name] competent to perform dressing changes independently. Pt will page me over the weekend if he has any issues. ?All education completed with exception of completed.  ? ?Plan/Recommendations:  ?1. Page VAD coordinators with VAD equipment and drive line concerns. ?2. Daily drive line dressing change per VAD coordinator or nurse champion using daily kit. ? ?Tanda Rockers RN ?VAD Coordinator  ?Office: [phone]  ?24/7 Pager: [phone]  ? ? ?  ?

## 2021-09-21 DIAGNOSIS — Z95811 Presence of heart assist device: Secondary | ICD-10-CM

## 2021-09-21 DIAGNOSIS — I480 Paroxysmal atrial fibrillation: Secondary | ICD-10-CM

## 2021-09-21 DIAGNOSIS — E876 Hypokalemia: Secondary | ICD-10-CM

## 2021-09-21 LAB — BASIC METABOLIC PANEL
Anion gap: 6 (ref 5–15)
BUN: 32 mg/dL — ABNORMAL HIGH (ref 6–20)
CO2: 32 mmol/L (ref 22–32)
Calcium: 8.8 mg/dL — ABNORMAL LOW (ref 8.9–10.3)
Chloride: 105 mmol/L (ref 98–111)
Creatinine, Ser: 1.06 mg/dL — ABNORMAL HIGH (ref 0.44–1.00)
GFR, Estimated: >60 mL/min (ref >60)
Glucose, Bld: 105 mg/dL — ABNORMAL HIGH (ref 70–99)
Potassium: 3.6 mmol/L (ref 3.5–5.1)
Sodium: 143 mmol/L (ref 135–145)

## 2021-09-21 LAB — PROTIME-INR
INR: 2.8 — ABNORMAL HIGH (ref 0.8–1.2)
Prothrombin Time: 29.2 seconds — ABNORMAL HIGH (ref 11.4–15.2)

## 2021-09-21 LAB — GLUCOSE, CAPILLARY
Glucose-Capillary: 98 mg/dL (ref 70–99)
Glucose-Capillary: 84 mg/dL (ref 70–99)
Glucose-Capillary: 88 mg/dL (ref 70–99)
Glucose-Capillary: 104 mg/dL — ABNORMAL HIGH (ref 70–99)

## 2021-09-21 MED ORDER — CHLORHEXIDINE GLUCONATE CLOTH 2 % EX PADS
6.0000 | MEDICATED_PAD | Freq: Two times a day (BID) | CUTANEOUS | Status: DC
  Administered 2021-09-21 – 2021-09-26 (×11): 6 via TOPICAL

## 2021-09-21 MED ORDER — WARFARIN SODIUM 2 MG PO TABS
2.0000 mg | ORAL_TABLET | Freq: Once | ORAL | Status: AC
  Administered 2021-09-21: 2 mg via ORAL
  Filled 2021-09-21: qty 1

## 2021-09-21 MED ORDER — PIVOT 1.5 CAL PO LIQD
840.0000 mL | ORAL | Status: DC
  Administered 2021-09-21 – 2021-09-22 (×2): 840 mL
  Filled 2021-09-21 (×2): qty 1000

## 2021-09-21 NOTE — Progress Notes (Signed)
?   09/21/21 0014  ?Assess: MEWS Score  ?Temp 98.5 ?F (36.9 ?C)  ?BP (!) 76/62  ?Pulse Rate 83  ?Resp 19  ?Level of Consciousness Alert  ?SpO2 99 %  ?O2 Device Room Air  ?Assess: MEWS Score  ?MEWS Temp 0  ?MEWS Systolic 2  ?MEWS Pulse 0  ?MEWS RR 0  ?MEWS LOC 0  ?MEWS Score 2  ?MEWS Score Color Yellow  ?Assess: if the MEWS score is Yellow or Red  ?Were vital signs taken at a resting state? Yes  ?Focused Assessment No change from prior assessment  ?Early Detection of Sepsis Score *See Row Information* Low  ?MEWS guidelines implemented *See Row Information* No, previously yellow, continue vital signs every 4 hours  ?Take Vital Signs  ?Increase Vital Sign Frequency  Yellow: Q 2hr X 2 then Q 4hr X 2, if remains yellow, continue Q 4hrs ?(pt already on q 4 VS)  ?Escalate  ?MEWS: Escalate Yellow: discuss with charge nurse/RN and consider discussing with provider and RRT  ?Notify: Charge Nurse/RN  ?Name of Charge Nurse/RN Notified [name], RN  ?Date Charge Nurse/RN Notified 09/21/21  ?Time Charge Nurse/RN Notified 0040  ? ? ?

## 2021-09-21 NOTE — Progress Notes (Signed)
ANTICOAGULATION CONSULT NOTE - Follow Up Consult ? ?Pharmacy Consult for Warfarin ?Indication: LVAD HM3 ? ?No Known Allergies ? ?Patient Measurements: ?Height: '5\' 3"'$  (160 cm) ?Weight: 100.9 kg (222 lb 7.1 oz) ?IBW/kg (Calculated) : 52.4 ? ?Vital Signs: ?Temp: 98 ?F (36.7 ?C) (04/01 0423) ?Temp Source: Oral (04/01 0423) ?BP: 101/74 (04/01 0801) ?Pulse Rate: 79 (04/01 0801) ? ?Labs: ?Recent Labs  ?  09/19/21 ?5852 09/19/21 ?1300 09/20/21 ?0259 09/20/21 ?1300 09/21/21 ?0543  ?HGB 8.7*  --  9.2*  --   --   ?HCT 30.7*  --  31.3*  --   --   ?PLT 276  --  271  --   --   ?LABPROT 24.4*  --  26.1*  --  29.2*  ?INR 2.2*  --  2.4*  --  2.8*  ?CREATININE 0.95   < > 1.00 0.83 1.06*  ? < > = values in this interval not displayed.  ? ? ?Estimated Creatinine Clearance: 72 mL/min (A) (by C-G formula based on SCr of 1.06 mg/dL (H)). ? ? ?Medications:  ?Scheduled:  ? amiodarone  400 mg Oral BID  ? aspirin EC  81 mg Oral Daily  ? Chlorhexidine Gluconate Cloth  6 each Topical Q12H  ? digoxin  0.0625 mg Oral Daily  ? feeding supplement (PIVOT 1.5 CAL)  840 mL Per Tube Q24H  ? insulin aspart  0-24 Units Subcutaneous TID WC & HS  ? methimazole  10 mg Oral Daily  ? methimazole  5 mg Oral QPM  ? midodrine  5 mg Oral TID WC  ? mirtazapine  7.5 mg Oral QHS  ? pantoprazole  40 mg Oral Daily  ? potassium chloride  40 mEq Oral TID  ? predniSONE  20 mg Oral Q breakfast  ? quiniDINE gluconate  324 mg Oral BID  ? rosuvastatin  40 mg Oral QHS  ? sildenafil  20 mg Oral TID  ? torsemide  80 mg Oral Daily  ? Warfarin - Pharmacist Dosing Inpatient   Does not apply [id number]  ? ? ?Assessment: ?51 yo F who is s/p HM3 LVAD placement on 3/14. Patient has been started on warfarin and aspirin. Aspirin '81mg'$  will be continued indefinitely given prior CVA.  ? ?INR today is supratherapeutic at 2.8. Cortrak placed on Friday, and PO intake has continued to improve. LDH had been stable 400-500s. Hgb stable, plts wnl.  ? ?Goal of Therapy:  ?INR 2-2.5 ?Monitor platelets by  anticoagulation protocol: Yes ?  ?Plan:  ?Warfarin 2 mg x 1 today.  ?Daily INR, CBC.  ?Monitor for signs/symptoms of bleeding. ? ? ?Vance Peper, PharmD ?PGY1 Pharmacy Resident ?Phone [phone] ?09/21/2021 10:12 AM  ? ?Please check AMION for all [place] phone numbers ?After 10:00 PM, call [place] [phone] ? ? ? ?

## 2021-09-21 NOTE — Progress Notes (Signed)
Patient ID: [name], female   DOB: 1970/10/13, 51 y.o.   MRN: [id number] ?  ? ? Advanced Heart Failure Rounding Note ? ?PCP-Cardiologist: [name], MD  ? ?Subjective:   ? ?No complaints this morning. Going for PT soon.  ? ?She remains in NSR.  ? ?LVAD Interrogation HM 3: ?Speed: 5800 Flow: 4.8 PI: 3.2 Power: 4.5.  ? ? ?Objective:   ?Weight Range: ?100.9 kg ?Body mass index is 39.4 kg/m?.  ? ?Vital Signs:   ?Temp:  [97.9 ?F (36.6 ?C)-98.5 ?F (36.9 ?C)] 98 ?F (36.7 ?C) (04/01 0423) ?Pulse Rate:  [79-83] 79 (04/01 0801) ?Resp:  [16-19] 16 (04/01 0801) ?BP: (75-101)/(45-86) 101/74 (04/01 0801) ?SpO2:  [95 %-100 %] 100 % (04/01 0801) ?Weight:  [100.9 kg] 100.9 kg (03/31 1555) ?Last BM Date : 09/17/21 ? ?Weight change: ?Filed Weights  ? 09/20/21 1555  ?Weight: 100.9 kg  ? ? ?Intake/Output:  ? ?Intake/Output Summary (Last 24 hours) at 09/21/2021 0854 ?Last data filed at 09/21/2021 0429 ?Gross per 24 hour  ?Intake 0 ml  ?Output 900 ml  ?Net -900 ml  ?  ? ? ?Physical Exam  ?  ?General: Well appearing this am. NAD.  ?HEENT: Normal. ?Neck: Supple, JVP 8 cm. Carotids OK.  ?Cardiac:  Mechanical heart sounds with LVAD hum present.  ?Lungs:  CTAB, normal effort.  ?Abdomen:  NT, ND, no HSM. No bruits or masses. +BS  ?LVAD exit site: Well-healed and incorporated. Dressing dry and intact. No erythema or drainage. Stabilization device present and accurately applied. Driveline dressing changed daily per sterile technique. ?Extremities:  Warm and dry. No cyanosis, clubbing, rash, or edema.  ?Neuro:  Alert & oriented x 3. Cranial nerves grossly intact. Moves all 4 extremities w/o difficulty. Affect pleasant   ? ? ?Labs  ?  ?CBC ?Recent Labs  ?  09/19/21 ?5188 09/20/21 ?0259  ?WBC 15.8* 16.1*  ?NEUTROABS 12.4* 12.1*  ?HGB 8.7* 9.2*  ?HCT 30.7* 31.3*  ?MCV 99.4 97.2  ?PLT 276 271  ? ?Basic Metabolic Panel ?Recent Labs  ?  09/19/21 ?4166 09/19/21 ?1300 09/20/21 ?0259 09/20/21 ?1300 09/21/21 ?0543  ?NA 143   < > 140 142 143  ?K 2.9*   <  > 5.4* 3.7 3.6  ?CL 105   < > 107 105 105  ?CO2 30   < > 28 26 32  ?GLUCOSE 127*   < > 110* 89 105*  ?BUN 45*   < > 40* 32* 32*  ?CREATININE 0.95   < > 1.00 0.83 1.06*  ?CALCIUM 8.7*   < > 9.1 8.7* 8.8*  ?MG 2.4  --  2.2  --   --   ? < > = values in this interval not displayed.  ? ?Liver Function Tests ?No results for input(s): AST, ALT, ALKPHOS, BILITOT, PROT, ALBUMIN in the last 72 hours. ?No results for input(s): LIPASE, AMYLASE in the last 72 hours. ?Cardiac Enzymes ?No results for input(s): CKTOTAL, CKMB, CKMBINDEX, TROPONINI in the last 72 hours. ? ?BNP: ?BNP (last 3 results) ?Recent Labs  ?  09/04/21 ?0630 09/10/21 ?1601 09/17/21 ?0140  ?BNP 358.8* 521.3* 279.4*  ? ? ?ProBNP (last 3 results) ?No results for input(s): PROBNP in the last 8760 hours. ? ? ?D-Dimer ?No results for input(s): DDIMER in the last 72 hours. ?Hemoglobin A1C ?No results for input(s): HGBA1C in the last 72 hours. ?Fasting Lipid Panel ?No results for input(s): CHOL, HDL, LDLCALC, TRIG, CHOLHDL, LDLDIRECT in the last 72 hours. ?Thyroid Function  Tests ?No results for input(s): TSH, T4TOTAL, T3FREE, THYROIDAB in the last 72 hours. ? ?Invalid input(s): FREET3 ? ?Other results: ? ? ?Imaging  ? ? ?No results found. ? ? ?Medications:   ? ? ?Scheduled Medications: ? amiodarone  400 mg Oral BID  ? aspirin EC  81 mg Oral Daily  ? Chlorhexidine Gluconate Cloth  6 each Topical Q12H  ? digoxin  0.0625 mg Oral Daily  ? feeding supplement (PIVOT 1.5 CAL)  840 mL Per Tube Q24H  ? insulin aspart  0-24 Units Subcutaneous TID WC & HS  ? methimazole  10 mg Oral Daily  ? methimazole  5 mg Oral QPM  ? midodrine  5 mg Oral TID WC  ? mirtazapine  7.5 mg Oral QHS  ? pantoprazole  40 mg Oral Daily  ? potassium chloride  40 mEq Oral TID  ? predniSONE  20 mg Oral Q breakfast  ? quiniDINE gluconate  324 mg Oral BID  ? rosuvastatin  40 mg Oral QHS  ? sildenafil  20 mg Oral TID  ? torsemide  80 mg Oral Daily  ? Warfarin - Pharmacist Dosing Inpatient   Does not apply  [id number]  ? ? ?Infusions: ? ? ?PRN Medications: ?ALPRAZolam, gabapentin, loperamide HCl, senna-docusate, sodium chloride, traMADol ? ? ?Assessment/Plan  ? ? 1. Acute on chronic systolic HF: Nonischemic cardiomyopathy.  She has a Medtronic ICD. Cause for cardiomyopathy is uncertain, initially identified peri-partum (after son's birth), so cannot rule out peri-partum CMP.  On and off, she has had frequent PVCs.   RHC in 8/15 showed normal filling pressures and preserved cardiac output. Echo in 4/22 with EF 25%, severe LV dilation.  CPX (8/22) with mild-moderate HF limitation.  Echo this admission with EF < 20% with severe dilation, normal RV size with mildly decreased systolic function, moderate-severe functional MR with dilated IVC. She was admitted with volume overload, NYHA class IV symptoms and low output. HCO3 18 and creatinine 1.5 on admit, co-ox 47%. Milrinone 0.25 started + lasix gtt.  I also suspect hyperthyroidism is playing a role in her CHF exacerbation. 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 Impella 5.5 placed 3/9.  Patient had HM3 LVAD placed 3/15. 3/15 Speed increased to 5300 rpm. 3/19 Speed increased to 5400. Ramp echo 3/22, fixed speed increased to 5600.  Ramp echo 3/29, speed increased to 5800. Stable currently, volume status improved and weight has trended down.  MAP 83 this morning. BP is difficult to obtain.  ?- Continue torsemide 80 mg daily. ?- Continue Kdur 40 bid for now.    ?- Off spiro with soft BP ?- Continue Digoxin 0.0625 mg  ?- Continue asa 81 mg daily. Will need long-term with h/o CVA ?- Continue sildenafil 20 mg tid  ?- Warfarin for VAD. INR 2.8. Dosing per pharmD.  ?- Continue midodrine 5 mg TID.  ?2. 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  She has been on Levoxyl at home but does not think she has taken for a month.  Now  hyperthyroid.  This likely contributes to her CHF exacerbation. [facility] evaluated her for this. 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 Solumedrol 20 mg IV daily -> transitioned to prednisone 20 on 3/16 and methimazole bid.  ?- per IM, continue prednisone 20 mg x 1 month (4/16), then reduce down to 10 mg until endo follow-up  ?-  Will need outpatient endocrinology followup.  ?- Continuing amiodarone for now with NSVT and rapid afib. See below. ?3. PVCs/NSVT: EP consulted given complicated situation with hyperthyroidism and NSVT. They favored continuing amiodarone and quinidine at this time.  Would eventually like to stop amio given hyperthyroidism.  ?4. CVA: CVA in 10/21, had thrombectomy with stent placement.  No LV thrombus noted and no atrial fibrillation noted.  ?- Talked with interventional radiology. >1 year post-stent, ok to stop ticagrelor and continue ASA.  ?5. Anemia: Post-op with chest tube output.  3/15, 3/16 1UPRBCs.  ?- Transfuse hgb < 8.  ?6. CKD stage 3: Renal function stable, creatinine 1.06 today.   ?7. Mitral regurgitation: Moderate-severe functional MR.  Unlikely to be Mitraclip candidate with severely dilated LV (>7 cm). Hopefully will improve with LVAD.  ?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  Unable to get MRI with ICD. Seen by GYN. Felt to be most likely non-neoplastic. Tumor markers within normal range. Possible low grade malignancy, could also be benign.  No ascites noted on abdominal CT and no mention of abdominal spread.  She is not a surgical candidate at this time with low output HF.  ?- Plan at this point would be to stabilize with LVAD then investigate the ovarian mass.  ?- F/u with GYN as outpatient ?9. Elevated WBCs: Thought to be due to prednisone.  ?10. Malnutrition: Appetite improving.  ?- Remeron added to stimulate appetite  ?- Now with nocturnal TFs via CorTrak, may be able  to remove Monday.  ?11. VT:  Paced out on 3/16.   ?- Continue amiodarone and quinidine.  ?- Keep K > 4.0 Mg > 2.0 ?12. Hypokalemia: Follow K daily.  ?13. Atrial fibrillation: Tolerates poorly.  She is maintaining N

## 2021-09-21 NOTE — Progress Notes (Signed)
Physical Therapy Session Note ? ?Patient Details  ?Name: [name] ?MRN: [id number] ?Date of Birth: Nov 10, 1970 ? ?Today's Date: 09/21/2021 ?PT Individual Time: 5093-2671 ?PT Individual Time Calculation (min): 48 min  ? ? ? ?Skilled Therapeutic Interventions/Progress Updates:  ? ?Pt received sitting in WC and agreeable to PT. Pt performed ambulatory transfer with push from thighs, RW and mod assist from PT for safety.  ? ?Sit<>stand from [facility] with RUE to push from arm rest performed with CGA throughout remainder of session with cues for chest precautions to remain "within the window".   ?Patient demonstrates increased fall risk as noted by score of   25/56 on Berg Balance Scale.  (<36= high risk for falls, close to 100%; 37-45 significant >80%; 46-51 moderate >50%; 52-55 lower >25%) multiple seated rest breaks due to fatigue intermittently throughout  ? ?PT instructed pt in TUG: 26.7 sec with RW (average of 3 trials; >13.5 sec indicates increased fall risk) ? ?Pt returned to room and performed stand pivot transfer to bed with CGA and RW for safety. Pt left sitting EOB with husband present and call [facility] in reach.  ? ?   ? ?Therapy Documentation ?Precautions:  ?Precautions ?Precautions: Fall, Sternal ?Precaution Comments: LVAD, sternal, NG tube ?Restrictions ?Weight Bearing Restrictions: No ? ?Pain:  ?Pain Assessment ?Pain Scale: 0-10 ?Pain Score: 0-No pain ? ?   ?Trunk/Postural Assessment : ?Cervical Assessment ?Cervical Assessment: Within Functional Limits ?Thoracic Assessment ?Thoracic Assessment: Exceptions to [facility] (rounded shoulders) ?Lumbar Assessment ?Lumbar Assessment: Exceptions to [facility] (posterior pelvic tilt) ?Postural Control ?Postural Control: Within Functional Limits  ?Balance: ?Balance ?Balance Assessed: Yes ?Standardized Balance Assessment ?Standardized Balance Assessment: Berg Balance Test;Timed Up and Go Test ?Berg Balance Test ?Sit to Stand: Able to stand using hands after several tries ?Standing  Unsupported: Able to stand 2 minutes with supervision ?Sitting with Back Unsupported but Feet Supported on Floor or Stool: Able to sit safely and securely 2 minutes ?Stand to Sit: Controls descent by using hands ?Transfers: Needs one person to assist ?Standing Unsupported with Eyes Closed: Able to stand 3 seconds ?Standing Ubsupported with Feet Together: Needs help to attain position but able to stand for 30 seconds with feet together ?From Standing, Reach Forward with Outstretched Arm: Can reach forward >5 cm safely (2") ?From Standing Position, Pick up Object from Floor: Unable to try/needs assist to keep balance ?From Standing Position, Turn to Look Behind Over each Shoulder: Turn sideways only but maintains balance ?Turn 360 Degrees: Needs close supervision or verbal cueing ?Standing Unsupported, Alternately Place Feet on Step/Stool: Able to complete >2 steps/needs minimal assist ?Standing Unsupported, One Foot in Front: Needs help to step but can hold 15 seconds ?Standing on One Leg: Able to lift leg independently and hold equal to or more than 3 seconds ?Total Score: 25 ?Timed Up and Go Test ?TUG: Normal TUG ?Normal TUG (seconds): 26.7 ?Static Sitting Balance ?Static Sitting - Balance Support: Feet supported ?Static Sitting - Level of Assistance: 6: Modified independent (Device/Increase time) ?Dynamic Sitting Balance ?Dynamic Sitting - Balance Support: Feet unsupported ?Dynamic Sitting - Level of Assistance: 5: Stand by assistance ?Static Standing Balance ?Static Standing - Balance Support: Bilateral upper extremity supported;During functional activity ?Static Standing - Level of Assistance: 5: Stand by assistance ?Dynamic Standing Balance ?Dynamic Standing - Balance Support: Bilateral upper extremity supported;During functional activity ?Dynamic Standing - Level of Assistance: 5: Stand by assistance;4: Min assist ? ? ? ? ?Therapy/Group: Individual Therapy ? ?Lorie Phenix ?09/21/2021, 3:56 PM  ?

## 2021-09-21 NOTE — Evaluation (Signed)
Physical Therapy Assessment and Plan ? ?Patient Details  ?Name: [name] ?MRN: [id number] ?Date of Birth: [birth year] ? ?PT Diagnosis: Difficulty walking, Hemiparesis dominant, and Muscle weakness ?Rehab Potential: Good ?ELOS: 10 days  ? ?Today's Date: 09/21/2021 ?PT Individual Time: 0177-9390 ?PT Individual Time Calculation (min): 74 min   ? ?Hospital Problem: Principal Problem: ?  Debility ?Active Problems: ?  LVAD (left ventricular assist device) present ([place]) ?  Hypokalemia ?  Paroxysmal atrial fibrillation (HCC) ? ? ?Past Medical History:  ?Past Medical History:  ?Diagnosis Date  ? Automatic implantable cardioverter-defibrillator in situ   ? Chronic CHF (congestive heart failure) (HCC)   ? a. EF 15-20% b. RHC (09/2013) RA 14, RV 57/22, PA 64/36 (48), PCWP 18, FIck CO/CI 3.7/1.6, PVR 8.1 WU, PA sat 47%   ? History of stomach ulcers   ? Hypertension   ? Hypotension   ? Hypothyroidism   ? Morbid obesity ([place])   ? Myocardial infarction [facility]) 08/2013  ? Nocturnal dyspnea   ? Nonischemic cardiomyopathy ([place])   ? Sinus tachycardia   ? Sleep apnea   ? Snoring-prob OSA 09/04/2011  ? Sprint Fidelis ICD lead RECALL  [phone]   ? UARS (upper airway resistance syndrome) 09/04/2011  ? HST 12/2013:  AHI 4/hr (numerous episodes of airflow reduction that did not have concomitant desaturation)   ? ?Past Surgical History:  ?Past Surgical History:  ?Procedure Laterality Date  ? BREATH TEK H PYLORI N/A 11/09/2014  ? Procedure: BREATH TEK H PYLORI;  Surgeon: Greer Pickerel, MD;  Location: [facility];  Service: General;  Laterality: N/A;  ? CARDIAC CATHETERIZATION  ~ 2006; 09/2013  ? CARDIAC CATHETERIZATION N/A 05/23/2016  ? Procedure: Right Heart Cath;  Surgeon: Larey Dresser, MD;  Location: [place] CV LAB;  Service: Cardiovascular;  Laterality: N/A;  ? CARDIAC DEFIBRILLATOR PLACEMENT  2006; 12/26/2013  ? Medtronic [id number]; 12-2013 ICD gen change and RV lead revision with new 6935 RV lead by Dr [name]  ? CENTRAL LINE INSERTION   08/28/2021  ? Procedure: CENTRAL LINE INSERTION;  Surgeon: Larey Dresser, MD;  Location: [place] CV LAB;  Service: Cardiovascular;;  ? [place]  ? IMPLANTABLE CARDIOVERTER DEFIBRILLATOR GENERATOR CHANGE N/A 12/26/2013  ? Procedure: IMPLANTABLE CARDIOVERTER DEFIBRILLATOR GENERATOR CHANGE;  Surgeon: Deboraha Sprang, MD;  Location: [facility] CATH LAB;  Service: Cardiovascular;  Laterality: N/A;  ? INSERTION OF IMPLANTABLE LEFT VENTRICULAR ASSIST DEVICE N/A 09/03/2021  ? Procedure: INSERTION OF IMPLANTABLE LEFT VENTRICULAR ASSIST DEVICE/ Heartmate 3;  Surgeon: Dahlia Byes, MD;  Location: [place];  Service: Open Heart Surgery;  Laterality: N/A;  ? IR CT HEAD LTD  04/17/2020  ? IR CT HEAD LTD  04/17/2020  ? IR INTRA CRAN STENT  04/17/2020  ? IR PERCUTANEOUS ART THROMBECTOMY/INFUSION INTRACRANIAL INC DIAG ANGIO  04/17/2020  ? IR RADIOLOGIST EVAL & MGMT  06/05/2020  ? LEAD REVISION N/A 12/26/2013  ? Procedure: LEAD REVISION;  Surgeon: Deboraha Sprang, MD;  Location: [facility] CATH LAB;  Service: Cardiovascular;  Laterality: N/A;  ? PLACEMENT OF IMPELLA LEFT VENTRICULAR ASSIST DEVICE Right 08/29/2021  ? Procedure: PLACEMENT OF IMPELLA 5.5 LEFT VENTRICULAR ASSIST DEVICE;  Surgeon: Gaye Pollack, MD;  Location: [place];  Service: Open Heart Surgery;  Laterality: Right;  ? RADIOLOGY WITH ANESTHESIA N/A 04/17/2020  ? Procedure: Code Stroke;  Surgeon: Luanne Bras, MD;  Location: [place];  Service: Radiology;  Laterality: N/A;  ? REMOVAL OF IMPELLA LEFT VENTRICULAR ASSIST DEVICE N/A 09/03/2021  ?  Procedure: REMOVAL OF IMPELLA LEFT VENTRICULAR ASSIST DEVICE;  Surgeon: Dahlia Byes, MD;  Location: [place];  Service: Open Heart Surgery;  Laterality: N/A;  ? RIGHT HEART CATH N/A 08/28/2021  ? Procedure: RIGHT HEART CATH;  Surgeon: Larey Dresser, MD;  Location: [place] CV LAB;  Service: Cardiovascular;  Laterality: N/A;  ? RIGHT HEART CATHETERIZATION N/A 02/15/2014  ? Procedure: RIGHT HEART CATH;  Surgeon: Larey Dresser, MD;   Location: [facility] CATH LAB;  Service: Cardiovascular;  Laterality: N/A;  ? TEE WITHOUT CARDIOVERSION N/A 08/29/2021  ? Procedure: TRANSESOPHAGEAL ECHOCARDIOGRAM (TEE);  Surgeon: Gaye Pollack, MD;  Location: [place];  Service: Open Heart Surgery;  Laterality: N/A;  ? TEE WITHOUT CARDIOVERSION N/A 09/03/2021  ? Procedure: TRANSESOPHAGEAL ECHOCARDIOGRAM (TEE);  Surgeon: Dahlia Byes, MD;  Location: [place];  Service: Open Heart Surgery;  Laterality: N/A;  ? TUBAL LIGATION  1999  ? ? ?Assessment & Plan ?Clinical Impression: Patient is a 51 y.o. 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  Per chart review patient lives with spouse.  Two-level home 3 steps to entry.  Reportedly independent prior to admission no assistive device but was limited by occasional shortness of breath.  Presented 08/25/2021 with increasing shortness of breath as well as fatigue with occasional nausea and palpitations.  She also reports nonproductive cough x1 month.  Denied any fever or sore throat.  She did see her primary cardiology PA on 08/21/2021 who recommended she take metolazone given concern for possible volume overload.  In the ED heart rate 116-130, BP 92-1 19/59-83, respiratory 25 saturating 100% on room air, BNP 1117, troponin 27-29.  Chest x-ray showed cardiomegaly without pulmonary edema.  CT for pulmonary emboli was negative.  EKG showed sinus tachycardia with left bundle branch block.  Echocardiogram with ejection fraction less than 20%.   No thrombus noted.  Cardiology follow-up milrinone was initiated.  Patient underwent insertion of implantable (LVAD) left ventricular assistive device 09/03/2021 per Dr. Darcey Nora.  Postoperative anemia receiving 1 unit packed red blood cells with latest hemoglobin 9.2.  She was slowly weaned off pressors as well as milrinone.  She continued to undergo diuresis.  Bouts of atrial fibrillation continued on p.o. amiodarone as well as digoxin.  Patient has been placed on Coumadin as well as remains on low-dose aspirin for history of CVA.Marland Kitchen  Hospital course incidental finding of left ovarian mass noted on CT abdomen pelvis.  Unable to get MRI due to ICD.  Seen by GYN felt to be most likely nonneoplastic.  Tumor markers were within normal limits.  No ascites noted.  Patient not a surgical candidate at this time follow-up outpatient.  She did have some leukocytosis 30,100 suspect due to steroids that she had been placed on for hyperthyroidism no evidence for infection urine culture 50,000 gram-positive cocci.  Final culture with Enterococcus faecalis likely colonized completing course of vancomycin and cefazolin.  Decreased nutritional storage her diet has been advanced to regular she was placed on Remeron to help stimulate appetite.  She was requiring 12-hour tube feeds for nutritional supplement.  Palliative  care was consulted to establish goals of care.  Therapy evaluations completed due to patient decreased functional mobility was admitted for a comprehensive rehab program.  Patient transferred to CIR on 09/20/2021 .  ? ?Patient currently requires min assist with mobility secondary to muscle weakness, decreased cardiorespiratoy endurance and new LVAD placement, unbalanced muscle activation, and decreased standing balance, decreased balance strategies, and difficulty maintaining precautions.  Prior to hospitalization, patient was modified independent  with mobility and lived with Spouse, Family (as well as 2 minor children,  11 and 13) in a House home.  Home access is 3Stairs to enter. ? ?Patient will benefit from skilled PT intervention to maximize safe functional mobility, minimize fall risk, and decrease caregiver burden fo

## 2021-09-21 NOTE — Evaluation (Signed)
Occupational Therapy Assessment and Plan ? ?Patient Details  ?Name: [name] ?MRN: [id number] ?Date of Birth: 10-10-1970 ? ?OT Diagnosis: muscle weakness (generalized) and decreased activity tolerance, chronic R hemi-paresis, decreased standing balance ?Rehab Potential: Rehab Potential (ACUTE ONLY): Good ?ELOS: 7-10 days  ? ?Today's Date: 09/21/2021 ?OT Individual Time: 0900-1004 ?OT Individual Time Calculation (min): 64 min   + 49 min  ? ?Hospital Problem: Principal Problem: ?  Debility ?Active Problems: ?  LVAD (left ventricular assist device) present ([place]) ?  Hypokalemia ?  Paroxysmal atrial fibrillation (HCC) ? ? ?Past Medical History:  ?Past Medical History:  ?Diagnosis Date  ? Automatic implantable cardioverter-defibrillator in situ   ? Chronic CHF (congestive heart failure) (HCC)   ? a. EF 15-20% b. RHC (09/2013) RA 14, RV 57/22, PA 64/36 (48), PCWP 18, FIck CO/CI 3.7/1.6, PVR 8.1 WU, PA sat 47%   ? History of stomach ulcers   ? Hypertension   ? Hypotension   ? Hypothyroidism   ? Morbid obesity ([place])   ? Myocardial infarction [facility]) 08/2013  ? Nocturnal dyspnea   ? Nonischemic cardiomyopathy ([place])   ? Sinus tachycardia   ? Sleep apnea   ? Snoring-prob OSA 09/04/2011  ? Sprint Fidelis ICD lead RECALL  [phone]   ? UARS (upper airway resistance syndrome) 09/04/2011  ? HST 12/2013:  AHI 4/hr (numerous episodes of airflow reduction that did not have concomitant desaturation)   ? ?Past Surgical History:  ?Past Surgical History:  ?Procedure Laterality Date  ? BREATH TEK H PYLORI N/A 11/09/2014  ? Procedure: BREATH TEK H PYLORI;  Surgeon: Greer Pickerel, MD;  Location[facility];  Service: General;  Laterality: N/A;  ? CARDIAC CATHETERIZATION  ~ 2006; 09/2013  ? CARDIAC CATHETERIZATION N/A 05/23/2016  ? Procedure: Right Heart Cath;  Surgeon: Larey [facility], MD;  Location: [place] CV LAB;  Service: Cardiovascular;  Laterality: N/A;  ? CARDIAC DEFIBRILLATOR PLACEMENT  2006; 12/26/2013  ? Medtronic [id number]; 12-2013 ICD  gen change and RV lead revision with new 6935 RV lead by Dr [name]  ? CENTRAL LINE INSERTION  08/28/2021  ? Procedure: CENTRAL LINE INSERTION;  Surgeon: Larey [facility]er, MD;  Location: [place] CV LAB;  Service: Cardiovascular;;  ? [place]  ? IMPLANTABLE CARDIOVERTER DEFIBRILLATOR GENERATOR CHANGE N/A 12/26/2013  ? Procedure: IMPLANTABLE CARDIOVERTER DEFIBRILLATOR GENERATOR CHANGE;  Surgeon: Deboraha Sprang, MD;  Location: [facility] CATH LAB;  Service: Cardiovascular;  Laterality: N/A;  ? INSERTION OF IMPLANTABLE LEFT VENTRICULAR ASSIST DEVICE N/A 09/03/2021  ? Procedure: INSERTION OF IMPLANTABLE LEFT VENTRICULAR ASSIST DEVICE/ Heartmate 3;  Surgeon: Dahlia Byes, MD;  Location: [place];  Service: Open Heart Surgery;  Laterality: N/A;  ? IR CT HEAD LTD  04/17/2020  ? IR CT HEAD LTD  04/17/2020  ? IR INTRA CRAN STENT  04/17/2020  ? IR PERCUTANEOUS ART THROMBECTOMY/INFUSION INTRACRANIAL INC DIAG ANGIO  04/17/2020  ? IR RADIOLOGIST EVAL & MGMT  06/05/2020  ? LEAD REVISION N/A 12/26/2013  ? Procedure: LEAD REVISION;  Surgeon: Deboraha Sprang, MD;  Location: [facility] CATH LAB;  Service: Cardiovascular;  Laterality: N/A;  ? PLACEMENT OF IMPELLA LEFT VENTRICULAR ASSIST DEVICE Right 08/29/2021  ? Procedure: PLACEMENT OF IMPELLA 5.5 LEFT VENTRICULAR ASSIST DEVICE;  Surgeon: Gaye Pollack, MD;  Location: [place];  Service: Open Heart Surgery;  Laterality: Right;  ? RADIOLOGY WITH ANESTHESIA N/A 04/17/2020  ? Procedure: Code Stroke;  Surgeon: Luanne Bras, MD;  Location: [place];  Service: Radiology;  Laterality: N/A;  ? REMOVAL OF IMPELLA LEFT VENTRICULAR ASSIST DEVICE N/A 09/03/2021  ? Procedure: REMOVAL OF IMPELLA LEFT VENTRICULAR ASSIST DEVICE;  Surgeon: Dahlia Byes, MD;  Location: [place];  Service: Open Heart Surgery;  Laterality: N/A;  ? RIGHT HEART CATH N/A 08/28/2021  ? Procedure: RIGHT HEART CATH;  Surgeon: Larey Dresser, MD;  Location: [place] CV LAB;  Service: Cardiovascular;  Laterality: N/A;  ? RIGHT HEART  CATHETERIZATION N/A 02/15/2014  ? Procedure: RIGHT HEART CATH;  Surgeon: Larey Dresser, MD;  Location: [facility] CATH LAB;  Service: Cardiovascular;  Laterality: N/A;  ? TEE WITHOUT CARDIOVERSION N/A 08/29/2021  ? Procedure: TRANSESOPHAGEAL ECHOCARDIOGRAM (TEE);  Surgeon: Gaye Pollack, MD;  Location: [place];  Service: Open Heart Surgery;  Laterality: N/A;  ? TEE WITHOUT CARDIOVERSION N/A 09/03/2021  ? Procedure: TRANSESOPHAGEAL ECHOCARDIOGRAM (TEE);  Surgeon: Dahlia Byes, MD;  Location: [place];  Service: Open Heart Surgery;  Laterality: N/A;  ? TUBAL LIGATION  1999  ? ? ?Assessment & Plan ?Clinical Impression: Patient is a 51 y.o. 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  Per chart review patient lives with spouse.  Two-level home 3 steps to entry.  Reportedly independent prior to admission no assistive device but was limited by occasional shortness of breath.  Presented 08/25/2021 with increasing shortness of breath as well as fatigue with occasional nausea and palpitations.  She also reports nonproductive cough x1 month.  Denied any fever or sore throat.  She did see her primary cardiology PA on 08/21/2021 who recommended she take metolazone given concern for possible volume overload.  In the ED heart rate 116-130, BP 92-1 19/59-83, respiratory 25 saturating 100% on room air, BNP 1117, troponin 27-29.  Chest x-ray showed cardiomegaly without pulmonary edema.  CT for pulmonary emboli was negative.  EKG showed sinus tachycardia with left  bundle branch block.  Echocardiogram with ejection fraction less than 20%.  No thrombus noted.  Cardiology follow-up milrinone was initiated.  Patient underwent insertion of implantable (LVAD) left ventricular assistive device 09/03/2021 per Dr. Darcey Nora.  Postoperative anemia receiving 1 unit packed red blood cells with latest hemoglobin 9.2.  She was slowly weaned off pressors as well as milrinone.  She continued to undergo diuresis.  Bouts of atrial fibrillation continued on p.o. amiodarone as well as digoxin.  Patient has been placed on Coumadin as well as remains on low-dose aspirin for history of CVA.Marland Kitchen  Hospital course incidental finding of left ovarian mass noted on CT abdomen pelvis.  Unable to get MRI due to ICD.  Seen by GYN felt to be most likely nonneoplastic.  Tumor markers were within normal limits.  No ascites noted.  Patient not a surgical candidate at this time follow-up outpatient.  She did have some leukocytosis 30,100 suspect due to steroids that she had been placed on for hyperthyroidism no evidence for infection urine culture 50,000 gram-positive cocci.  Final culture with Enterococcus faecalis likely colonized completing course of vancomycin and cefazolin.  Decreased nutritional storage her diet has been advanced to regular she was placed on Remeron to  help stimulate appetite.  She was requiring 12-hour tube feeds for nutritional supplement.  Palliative care was consulted to establish goals of care.  Therapy evaluations completed due to patient decreased functional mobility was admitted for a comprehensive rehab program. ? .  Patient transferred to CIR on 09/20/2021 .   ? ?Patient currently requires min with basic self-care skills secondary to muscle weakness, decreased cardiorespiratoy endurance, chronic mild R HP, and decreased standing balance and decreased balance strategies.  Prior to hospitalization, patient could complete BADL/mobility with independent . ? ?Patient will benefit from  skilled intervention to decrease level of assist with basic self-care skills, increase independence with basic self-care skills, and increase level of independence with iADL prior to discharge home with care

## 2021-09-21 NOTE — Plan of Care (Signed)
?  Problem: Sit to Stand ?Goal: LTG:  Patient will perform sit to stand in prep for activites of daily living with assistance level (OT) ?Description: LTG:  Patient will perform sit to stand in prep for activites of daily living with assistance level (OT) ?Flowsheets (Taken 09/21/2021 1036) ?LTG: PT will perform sit to stand in prep for activites of daily living with assistance level: Independent with assistive device ?  ?Problem: RH Grooming ?Goal: LTG Patient will perform grooming w/assist,cues/equip (OT) ?Description: LTG: Patient will perform grooming with assist, with/without cues using equipment (OT) ?Flowsheets (Taken 09/21/2021 1036) ?LTG: Pt will perform grooming with assistance level of: Independent with assistive device  ?  ?Problem: RH Bathing ?Goal: LTG Patient will bathe all body parts with assist levels (OT) ?Description: LTG: Patient will bathe all body parts with assist levels (OT) ?Flowsheets (Taken 09/21/2021 1036) ?LTG: Pt will perform bathing with assistance level/cueing: Supervision/Verbal cueing ?  ?Problem: RH Dressing ?Goal: LTG Patient will perform upper body dressing (OT) ?Description: LTG Patient will perform upper body dressing with assist, with/without cues (OT). ?Flowsheets (Taken 09/21/2021 1036) ?LTG: Pt will perform upper body dressing with assistance level of: Independent with assistive device ?Goal: LTG Patient will perform lower body dressing w/assist (OT) ?Description: LTG: Patient will perform lower body dressing with assist, with/without cues in positioning using equipment (OT) ?Flowsheets (Taken 09/21/2021 1036) ?LTG: Pt will perform lower body dressing with assistance level of: Supervision/Verbal cueing ?  ?Problem: RH Toileting ?Goal: LTG Patient will perform toileting task (3/3 steps) with assistance level (OT) ?Description: LTG: Patient will perform toileting task (3/3 steps) with assistance level (OT)  ?Flowsheets (Taken 09/21/2021 1036) ?LTG: Pt will perform toileting task (3/3  steps) with assistance level: Independent with assistive device ?  ?Problem: RH Simple Meal Prep ?Goal: LTG Patient will perform simple meal prep w/assist (OT) ?Description: LTG: Patient will perform simple meal prep with assistance, with/without cues (OT). ?Flowsheets (Taken 09/21/2021 1036) ?LTG: Pt will perform simple meal prep with assistance level of: Supervision/Verbal cueing ?  ?Problem: RH Toilet Transfers ?Goal: LTG Patient will perform toilet transfers w/assist (OT) ?Description: LTG: Patient will perform toilet transfers with assist, with/without cues using equipment (OT) ?Flowsheets (Taken 09/21/2021 1036) ?LTG: Pt will perform toilet transfers with assistance level of: Supervision/Verbal cueing ?  ?Problem: RH Furniture Transfers ?Goal: LTG Patient will perform furniture transfers w/assist (OT/PT) ?Description: LTG: Patient will perform furniture transfers  with assistance (OT/PT). ?Flowsheets (Taken 09/21/2021 1036) ?LTG: Pt will perform furniture transfers with assist:: Supervision/Verbal cueing ?  ?

## 2021-09-21 NOTE — Progress Notes (Signed)
?                                                       PROGRESS NOTE ? ? ?Subjective/Complaints: ?Good BM this am  ?Pt without new issues overnite, PT just finished working with pt  ? ?Objective: ?  ?No results found. ?Recent Labs  ?  09/19/21 ?1027 09/20/21 ?0259  ?WBC 15.8* 16.1*  ?HGB 8.7* 9.2*  ?HCT 30.7* 31.3*  ?PLT 276 271  ? ?Recent Labs  ?  09/20/21 ?1300 09/21/21 ?0543  ?NA 142 143  ?K 3.7 3.6  ?CL 105 105  ?CO2 26 32  ?GLUCOSE 89 105*  ?BUN 32* 32*  ?CREATININE 0.83 1.06*  ?CALCIUM 8.7* 8.8*  ? ? ?Intake/Output Summary (Last 24 hours) at 09/21/2021 1102 ?Last data filed at 09/21/2021 0429 ?Gross per 24 hour  ?Intake 0 ml  ?Output 900 ml  ?Net -900 ml  ?  ? ?  ? ?Physical Exam: ?Vital Signs ?Blood pressure 101/74, pulse 79, temperature 98 ?F (36.7 ?C), temperature source Oral, resp. rate 16, height '5\' 3"'$  (1.6 m), weight 100.9 kg, SpO2 100 %. ? ?General: No acute distress ?Mood and affect are appropriate ?Heart: Regular rate and rhythm no rubs murmurs or extra sounds ?Lungs: Clear to auscultation, breathing unlabored, no rales or wheezes ?Abdomen: Positive bowel sounds, soft nontender to palpation, nondistended ?Extremities: No clubbing, cyanosis, or edema ?Skin: No evidence of breakdown, no evidence of rash ?Neurologic: Cranial nerves II through XII intact, motor strength is 4/5 in bilateral deltoid, bicep, tricep, grip, hip flexor, knee extensors, ankle dorsiflexor and plantar flexor ?Sensory exam normal sensation to light touch and proprioception in bilateral upper and lower extremities ?Cerebellar exam normal finger to nose to finger as well as heel to shin in bilateral upper and lower extremities ?Musculoskeletal: Full range of motion in all 4 extremities. No joint swelling ? ?Skin epidermis open on drain wound x 2 , no drainage or erythema ? ?Assessment/Plan: ?1. Functional deficits which require 3+ hours per day of interdisciplinary therapy in a comprehensive inpatient rehab setting. ?Physiatrist is  providing close team supervision and 24 hour management of active medical problems listed below. ?Physiatrist and rehab team continue to assess barriers to discharge/monitor patient progress toward functional and medical goals ? ?Care Tool: ? ?Bathing ?   ?Body parts bathed by patient: Face, Right arm, Left arm, Chest, Abdomen, Right upper leg, Left upper leg, Right lower leg, Left lower leg  ?   ?  ?  ?Bathing assist Assist Level: Minimal Assistance - Patient > 75% ?  ?  ?Upper Body Dressing/Undressing ?Upper body dressing   ?What is the patient wearing?: Button up shirt ?   ?Upper body assist Assist Level: Minimal Assistance - Patient > 75% ?   ?Lower Body Dressing/Undressing ?Lower body dressing ? ? ?   ?What is the patient wearing?: Underwear/pull up, Pants ? ?  ? ?Lower body assist Assist for lower body dressing: Minimal Assistance - Patient > 75% ?   ? ?Toileting ?Toileting Toileting Activity did not occur (Probation officer and hygiene only): N/A (no void or bm)  ?Toileting assist Assist for toileting: Maximal Assistance - Patient 25 - 49% ?  ?  ?Transfers ?Chair/bed transfer ? ?Transfers assist ?   ? ?Chair/bed transfer assist level: Minimal Assistance - Patient >  75% ?  ?  ?Locomotion ?Ambulation ? ? ?Ambulation assist ? ?   ? ?  ?  ?   ? ?Walk 10 feet activity ? ? ?Assist ?   ? ?  ?   ? ?Walk 50 feet activity ? ? ?Assist   ? ?  ?   ? ? ?Walk 150 feet activity ? ? ?Assist   ? ?  ?  ?  ? ?Walk 10 feet on uneven surface  ?activity ? ? ?Assist   ? ? ?  ?   ? ?Wheelchair ? ? ? ? ?Assist   ?  ?  ? ?  ?   ? ? ?Wheelchair 50 feet with 2 turns activity ? ? ? ?Assist ? ?  ?  ? ? ?   ? ?Wheelchair 150 feet activity  ? ? ? ?Assist ?   ? ? ?   ? ?Blood pressure 101/74, pulse 79, temperature 98 ?F (36.7 ?C), temperature source Oral, resp. rate 16, height '5\' 3"'$  (1.6 m), weight 100.9 kg, SpO2 100 %. ? ?Medical Problem List and Plan: ?1. Functional deficits secondary to debility/acute chronic systolic congestive heart  failure/nonischemic cardiomyopathy with ICD status post LVAD 3/15. ?            -patient may not shower ?            -ELOS/Goals: 7-10 days, min assist/sup with PT,OT and mod I SLP ?2.  Antithrombotics: ?-DVT/anticoagulation:  Pharmaceutical: Coumadin ?            -antiplatelet therapy: Aspirin 81 mg daily ?3. Pain Management: Oxycodone as needed Neurontin 100 mg 3 times daily as needed ?4. Mood: Remeron 7.5 mg nightly, anxiety 0.25 mg 3 times daily as needed ?            -antipsychotic agents: N/A ?5. Neuropsych: This patient is capable of making decisions on her own behalf. ?6. Skin/Wound Care: Routine skin checks ?            -continue q-shift dressing changes to right shoulder hematoma ?7. Fluids/Electrolytes/Nutrition: Routine in and outs with follow-up chemistries during admit ?8.  Acute blood loss anemia.  Follow-up CBC ?9.  Hypotension.  ProAmatine 5 mg 3 times daily ?10.  Atrial fibrillation.  Continue amiodarone 400 mg twice daily per cardiology services as well as Lanoxin 0.0625 mg daily ?11.  CKD stage III.  Follow-up chemistries ? ?  Latest Ref Rng & Units 09/21/2021  ?  5:43 AM 09/20/2021  ?  1:00 PM 09/20/2021  ?  2:59 AM  ?BMP  ?Glucose 70 - 99 mg/dL 105   89   110    ?BUN 6 - 20 mg/dL 32   32   40    ?Creatinine 0.44 - 1.00 mg/dL 1.06   0.83   1.00    ?Sodium 135 - 145 mmol/L 143   142   140    ?Potassium 3.5 - 5.1 mmol/L 3.6   3.7   5.4    ?Chloride 98 - 111 mmol/L 105   105   107    ?CO2 22 - 32 mmol/L 32   26   28    ?Calcium 8.9 - 10.3 mg/dL 8.8   8.7   9.1    ? Stable 4/1 ?12.  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  Continue aspirin therapy ?13.  Morbid obesity.  BMI 39.17.  Dietary follow-up ?            -  pt aware of change needed in diet ?14.  Hyperthyroidism.  Continue Tapazole.  Patient initially hypothyroidism but has been not taking her medication for 1 month prior to admission.  Currently maintained on steroid therapy for hyperthyroid condition.  Per internal  medicine continue prednisone 20 mg daily x1 month then reduce to 10 mg until endocrinology follow-up as outpatient ?15.  Hyperlipidemia.  Crestor ?16.  Left ovarian mass.  Noted on CT abdomen pelvis.  Unable to get MRI due to ICD.  Tumor markers within normal limits discussed with GYN.  Follow-up outpatient ?17.  Decreased nutritional storage.  Currently on a regular diet.  Patient has required nightly nocturnal tube feeds.   ?            -discussed with pt today. Told her we could take out NGT if she' eating enough by mouth. She has yet to take in PO consistently. Dietician monitoring intake reassess Monday  ?  ? ?LOS: ?1 days ?A FACE TO FACE EVALUATION WAS PERFORMED ? ?Luanna Salk Madelynn Malson ?09/21/2021, 11:02 AM  ? ? ? ?

## 2021-09-21 NOTE — Progress Notes (Signed)
Initial Nutrition Assessment ? ?DOCUMENTATION CODES:  ? ?Not applicable ? ?INTERVENTION:  ? ?- Agree with REGULAR diet order given history of malnutrition and inadequate PO intake ? ?Continue nocturnal tube feeds via Cortrak: ?- Pivot 1.5 @ 70 ml/hr x 12 hours from 1800 to 0600 (total of 840 ml) ? ?Nocturnal tube feeding regimen provides 1260 kcal, 79 grams of protein, and 630 ml of H2O (meets 66% of minimum kcal needs and 79% of minimum protein needs). ? ?NUTRITION DIAGNOSIS:  ? ?Increased nutrient needs related to chronic illness, post-op healing, other (therapies) as evidenced by estimated needs. ? ?GOAL:  ? ?Patient will meet greater than or equal to 90% of their needs ? ?MONITOR:  ? ?PO intake, Labs, Weight trends, I & O's ? ?REASON FOR ASSESSMENT:  ? ?Consult ?Enteral/tube feeding initiation and management ? ?ASSESSMENT:  ? ?51 year old female who was admitted to [facility] on 3/31. PMH of HTN, hypothyroidism, nonischemic cardiomyopathy with EF 20-25% s/p AICD, severe MR. 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 in October 2021, CKD stage III, CHF. During acute admission, pt underwent LVAD placement on 09/03/21. Pt had Cortrak placed due to poor PO intake and has been transitioned from continuous tube feeds to nocturnal tube feeds. ? ?RD working remotely. ? ?RD team is very familiar with pt from acute admission. Spoke with pt briefly via phone call to room. Pt getting ready for first therapy session this morning. Prior to acute care admission, pt had been having a poor appetite for at least 1 month. Pt was experiencing early satiety due to fluid gains and had been eating minimally. Pt was mostly eating fruit like bananas and oranges. ? ?Pt reports PO intake continues to improve. Pt is on a Regular diet to allow for the most variety in food choices. Pt reports that her husband brought her a Happy Meal yesterday and it was the best thing she has had in a while. Pt continues to keep track of her PO intake and reports that  she has several meal tickets saved for RD. RD encouraged pt to continue to track her PO intake and save meal tickets for unit RD to review next week. Pt believes that her PO intake is improving significantly and that she is close to being able to have Cortrak removed. ? ?Cortrak remains in place with nocturnal tube feeds ordered. Will continue these for now with plans to re-evaluate soon. Of note, pt does not like or tolerance oral nutrition supplements. ? ?Reviewed weight history in chart. Pt with a slow, steady decline in weight over the last 6 months. Pt with a weight loss of 8 kg since 04/29/21. This is a 7.3% weight loss in 5 months which is not clinically significant for timeframe. Suspect that a portion of weight loss can be attributed to volume status given CHF diagnosis. ? ?Pt with previous diagnosis of moderate chronic illness during acute hospital admission. Suspect some degree of malnutrition remains but unable to confirm without NFPE. ? ?Medications reviewed and include: SSI, remeron 7.5 mg daily, protonix, klor-con 40 mEq TID, prednisone, torsemide, warfarin ? ?Labs reviewed: BUN 32, creatinine 1.06 ?CBG's: 88-136 x 24 hours ? ?UOP: 900 ml x 12 hours ? ?NUTRITION - FOCUSED PHYSICAL EXAM: ? ?Unable to complete at this time. RD working remotely. ? ?Diet Order:   ?Diet Order   ? ?       ?  Diet regular Room service appropriate? Yes; Fluid consistency: Thin  Diet effective now       ?  ? ?  ?  ? ?  ? ? ?  EDUCATION NEEDS:  ? ?Education needs have been addressed ? ?Skin:  Skin Assessment: ?Skin Integrity Issues: ?Incisions: closed incision to chest, abdomen ?Other: skin tear abdomen, new LVAD/driveline ? ?Last BM:  09/21/21 large type 5 ? ?Height:  ? ?Ht Readings from Last 1 Encounters:  ?09/20/21 '5\' 3"'$  (1.6 m)  ? ? ?Weight:  ? ?Wt Readings from Last 1 Encounters:  ?09/20/21 100.9 kg  ? ? ?BMI:  Body mass index is 39.4 kg/m?. ? ?Estimated Nutritional Needs:  ? ?Kcal:  1900-2100 ? ?Protein:  100-120  grams ? ?Fluid:  2.0 L ? ? ? ?Gustavus Bryant, MS, RD, LDN ?Inpatient Clinical Dietitian ?Please see AMiON for contact information. ? ?

## 2021-09-22 LAB — CBC
HCT: 33 % — ABNORMAL LOW (ref 36.0–46.0)
Hemoglobin: 9.6 g/dL — ABNORMAL LOW (ref 12.0–15.0)
MCH: 27.6 pg (ref 26.0–34.0)
MCHC: 29.1 g/dL — ABNORMAL LOW (ref 30.0–36.0)
MCV: 94.8 fL (ref 80.0–100.0)
Platelets: 260 10*3/uL (ref 150–400)
RBC: 3.48 MIL/uL — ABNORMAL LOW (ref 3.87–5.11)
RDW: 22.4 % — ABNORMAL HIGH (ref 11.5–15.5)
WBC: 9.7 10*3/uL (ref 4.0–10.5)
nRBC: 0.2 % (ref 0.0–0.2)

## 2021-09-22 LAB — BASIC METABOLIC PANEL
Anion gap: 13 (ref 5–15)
BUN: 28 mg/dL — ABNORMAL HIGH (ref 6–20)
CO2: 33 mmol/L — ABNORMAL HIGH (ref 22–32)
Calcium: 9 mg/dL (ref 8.9–10.3)
Chloride: 99 mmol/L (ref 98–111)
Creatinine, Ser: 0.92 mg/dL (ref 0.44–1.00)
GFR, Estimated: >60 mL/min (ref >60)
Glucose, Bld: 106 mg/dL — ABNORMAL HIGH (ref 70–99)
Potassium: 3 mmol/L — ABNORMAL LOW (ref 3.5–5.1)
Sodium: 145 mmol/L (ref 135–145)

## 2021-09-22 LAB — POTASSIUM: Potassium: 3.5 mmol/L (ref 3.5–5.1)

## 2021-09-22 LAB — PROTIME-INR
INR: 3.1 — ABNORMAL HIGH (ref 0.8–1.2)
Prothrombin Time: 31.9 seconds — ABNORMAL HIGH (ref 11.4–15.2)

## 2021-09-22 LAB — GLUCOSE, CAPILLARY
Glucose-Capillary: 107 mg/dL — ABNORMAL HIGH (ref 70–99)
Glucose-Capillary: 130 mg/dL — ABNORMAL HIGH (ref 70–99)
Glucose-Capillary: 69 mg/dL — ABNORMAL LOW (ref 70–99)
Glucose-Capillary: 114 mg/dL — ABNORMAL HIGH (ref 70–99)
Glucose-Capillary: 99 mg/dL (ref 70–99)

## 2021-09-22 LAB — LACTATE DEHYDROGENASE: LDH: 373 U/L — ABNORMAL HIGH (ref 98–192)

## 2021-09-22 MED ORDER — WARFARIN SODIUM 1 MG PO TABS
1.5000 mg | ORAL_TABLET | Freq: Once | ORAL | Status: AC
  Administered 2021-09-22: 1.5 mg via ORAL
  Filled 2021-09-22: qty 1

## 2021-09-22 MED ORDER — TORSEMIDE 20 MG PO TABS
40.0000 mg | ORAL_TABLET | Freq: Every day | ORAL | Status: DC
  Administered 2021-09-22 – 2021-09-25 (×4): 40 mg via ORAL
  Filled 2021-09-22 (×3): qty 2

## 2021-09-22 MED ORDER — TORSEMIDE 20 MG PO TABS: 40.0000 mg | ORAL_TABLET | Freq: Every day | ORAL | Status: DC

## 2021-09-22 NOTE — Progress Notes (Signed)
Pt refused 80 mg of torsemide requesting lower dose, contacted Dr. [name] new order for decreased dose to '40mg'$   ?

## 2021-09-22 NOTE — Progress Notes (Signed)
?   09/22/21 0019  ?Assess: MEWS Score  ?Temp 97.9 ?F (36.6 ?C)  ?BP (!) 79/49  ?Pulse Rate 85  ?Resp 16  ?Level of Consciousness Alert  ?SpO2 99 %  ?O2 Device Room Air  ?Assess: MEWS Score  ?MEWS Temp 0  ?MEWS Systolic 2  ?MEWS Pulse 0  ?MEWS RR 0  ?MEWS LOC 0  ?MEWS Score 2  ?MEWS Score Color Yellow  ?Assess: if the MEWS score is Yellow or Red  ?Were vital signs taken at a resting state? Yes  ?Focused Assessment No change from prior assessment  ?Early Detection of Sepsis Score *See Row Information* Low  ?MEWS guidelines implemented *See Row Information* No, previously yellow, continue vital signs every 4 hours  ?Treat  ?Pain Scale 0-10  ?Pain Score 0  ?Take Vital Signs  ?Increase Vital Sign Frequency  Yellow: Q 2hr X 2 then Q 4hr X 2, if remains yellow, continue Q 4hrs ?(pt is currently on q 4 VS)  ?Escalate  ?MEWS: Escalate Yellow: discuss with charge nurse/RN and consider discussing with provider and RRT  ?Notify: Charge Nurse/RN  ?Name of Charge Nurse/RN Notified [name], RN  ?Date Charge Nurse/RN Notified 09/22/21  ?Time Charge Nurse/RN Notified 0045  ? ? ?

## 2021-09-22 NOTE — Plan of Care (Signed)
?  Problem: RH Furniture Transfers ?Goal: LTG Patient will perform furniture transfers w/assist (OT/PT) ?Description: LTG: Patient will perform furniture transfers  with assistance (OT/PT). ?Flowsheets (Taken 09/21/2021 1724) ?LTG: Pt will perform furniture transfers with assist:: Supervision/Verbal cueing ?  ?Problem: RH Balance ?Goal: LTG Patient will maintain dynamic standing balance (PT) ?Description: LTG:  Patient will maintain dynamic standing balance with assistance during mobility activities (PT) ?Flowsheets (Taken 09/21/2021 1724) ?LTG: Pt will maintain dynamic standing balance during mobility activities with:: Supervision/Verbal cueing ?  ?Problem: Sit to Stand ?Goal: LTG:  Patient will perform sit to stand with assistance level (PT) ?Description: LTG:  Patient will perform sit to stand with assistance level (PT) ?Flowsheets (Taken 09/21/2021 1724) ?LTG: PT will perform sit to stand in preparation for functional mobility with assistance level: Independent with assistive device ?  ?Problem: RH Bed Mobility ?Goal: LTG Patient will perform bed mobility with assist (PT) ?Description: LTG: Patient will perform bed mobility with assistance, with/without cues (PT). ?Flowsheets (Taken 09/21/2021 1724) ?LTG: Pt will perform bed mobility with assistance level of: Supervision/Verbal cueing ?  ?Problem: RH Bed to Chair Transfers ?Goal: LTG Patient will perform bed/chair transfers w/assist (PT) ?Description: LTG: Patient will perform bed to chair transfers with assistance (PT). ?Flowsheets (Taken 09/21/2021 1724) ?LTG: Pt will perform Bed to Chair Transfers with assistance level: Supervision/Verbal cueing ?  ?Problem: RH Car Transfers ?Goal: LTG Patient will perform car transfers with assist (PT) ?Description: LTG: Patient will perform car transfers with assistance (PT). ?Flowsheets (Taken 09/21/2021 1724) ?LTG: Pt will perform car transfers with assist:: Supervision/Verbal cueing ?  ?Problem: RH Ambulation ?Goal: LTG Patient will  ambulate in home environment (PT) ?Description: LTG: Patient will ambulate in home environment, # of feet with assistance (PT). ?Flowsheets (Taken 09/21/2021 1724) ?LTG: Pt will ambulate in home environ  assist needed:: Supervision/Verbal cueing ?LTG: Ambulation distance in home environment: 50 ft using LRAD ?Goal: LTG Patient will ambulate in community environment (PT) ?Description: LTG: Patient will ambulate in community environment, # of feet with assistance (PT). ?Flowsheets (Taken 09/21/2021 1724) ?LTG: Pt will ambulate in community environ  assist needed:: Supervision/Verbal cueing ?LTG: Ambulation distance in community environment: at least 200 feet using LRAD ?  ?Problem: RH Stairs ?Goal: LTG Patient will ambulate up and down stairs w/assist (PT) ?Description: LTG: Patient will ambulate up and down # of stairs with assistance (PT) ?Flowsheets (Taken 09/21/2021 1724) ?LTG: Pt will ambulate up/down stairs assist needed:: Contact Guard/Touching assist ?LTG: Pt will  ambulate up and down number of stairs: at least 3 steps using HR setup as per home environment in order to enter/exit home ?  ?

## 2021-09-22 NOTE — Progress Notes (Signed)
ANTICOAGULATION CONSULT NOTE - Follow Up Consult ? ?Pharmacy Consult for Warfarin ?Indication: LVAD HM3 ? ?No Known Allergies ? ?Patient Measurements: ?Height: '5\' 3"'$  (160 cm) ?Weight: 100.9 kg (222 lb 7.1 oz) ?IBW/kg (Calculated) : 52.4 ? ?Vital Signs: ?Temp: 98.1 ?F (36.7 ?C) (04/02 0415) ?Temp Source: Oral (04/02 0415) ?BP: 72/51 (04/02 0415) ?Pulse Rate: 83 (04/02 0415) ? ?Labs: ?Recent Labs  ?  09/20/21 ?0259 09/20/21 ?1300 09/21/21 ?7616 09/22/21 ?0601  ?HGB 9.2*  --   --  9.6*  ?HCT 31.3*  --   --  33.0*  ?PLT 271  --   --  260  ?LABPROT 26.1*  --  29.2* 31.9*  ?INR 2.4*  --  2.8* 3.1*  ?CREATININE 1.00 0.83 1.06* 0.92  ? ? ?Estimated Creatinine Clearance: 82.9 mL/min (by C-G formula based on SCr of 0.92 mg/dL). ? ? ?Medications:  ?Scheduled:  ? amiodarone  400 mg Oral BID  ? aspirin EC  81 mg Oral Daily  ? Chlorhexidine Gluconate Cloth  6 each Topical Q12H  ? digoxin  0.0625 mg Oral Daily  ? feeding supplement (PIVOT 1.5 CAL)  840 mL Per Tube Q24H  ? insulin aspart  0-24 Units Subcutaneous TID WC & HS  ? methimazole  10 mg Oral Daily  ? methimazole  5 mg Oral QPM  ? midodrine  5 mg Oral TID WC  ? mirtazapine  7.5 mg Oral QHS  ? pantoprazole  40 mg Oral Daily  ? potassium chloride  40 mEq Oral TID  ? predniSONE  20 mg Oral Q breakfast  ? quiniDINE gluconate  324 mg Oral BID  ? rosuvastatin  40 mg Oral QHS  ? sildenafil  20 mg Oral TID  ? torsemide  80 mg Oral Daily  ? Warfarin - Pharmacist Dosing Inpatient   Does not apply [id number]  ? ? ?Assessment: ?51 yo F who is s/p HM3 LVAD placement on 3/14. Patient has been started on warfarin and aspirin. Aspirin '81mg'$  will be continued indefinitely given prior CVA.  ? ?INR today is supratherapeutic at 3.1. Cortrak placed on Friday, and PO intake has continued to improve. LDH had been stable 400-500s. Hgb stable, plts wnl. No signs/symptoms of bleeding noted per RN.  ? ?INRs have been labile, likely still seeing the effects from higher warfarin doses given 3/28-3/30. Will  give a lower warfarin dose today and continue to monitor.  ? ?Goal of Therapy:  ?INR 2-2.5 ?Monitor platelets by anticoagulation protocol: Yes ?  ?Plan:  ?Warfarin 1.5 mg x 1 today.  ?Daily INR, CBC.  ?Monitor for signs/symptoms of bleeding. ? ? ?Vance Peper, PharmD ?PGY1 Pharmacy Resident ?Phone [phone] ?09/22/2021 7:01 AM  ? ?Please check AMION for all [facility] phone numbers ?After 10:00 PM, call [facility] [phone] ? ? ? ?

## 2021-09-22 NOTE — Evaluation (Signed)
Speech Language Pathology Assessment and Plan ? ?Patient Details  ?Name: [name] ?MRN: [id number] ?Date of Birth: 30-Jul-1970 ? ? ?Today's Date: 09/22/2021 ?SLP Individual Time: 0800-0900 ?SLP Individual Time Calculation (min): 60 min ? ? ?Hospital Problem: Principal Problem: ?  Debility ?Active Problems: ?  LVAD (left ventricular assist device) present ([place]) ?  Hypokalemia ?  Paroxysmal atrial fibrillation (HCC) ? ?Past Medical History:  ?Past Medical History:  ?Diagnosis Date  ? Automatic implantable cardioverter-defibrillator in situ   ? Chronic CHF (congestive heart failure) (HCC)   ? a. EF 15-20% b. RHC (09/2013) RA 14, RV 57/22, PA 64/36 (48), PCWP 18, FIck CO/CI 3.7/1.6, PVR 8.1 WU, PA sat 47%   ? History of stomach ulcers   ? Hypertension   ? Hypotension   ? Hypothyroidism   ? Morbid obesity ([place])   ? Myocardial infarction [facility]) 08/2013  ? Nocturnal dyspnea   ? Nonischemic cardiomyopathy ([place])   ? Sinus tachycardia   ? Sleep apnea   ? Snoring-prob OSA 09/04/2011  ? Sprint Fidelis ICD lead RECALL  [phone]   ? UARS (upper airway resistance syndrome) 09/04/2011  ? HST 12/2013:  AHI 4/hr (numerous episodes of airflow reduction that did not have concomitant desaturation)   ? ?Past Surgical History:  ?Past Surgical History:  ?Procedure Laterality Date  ? BREATH TEK H PYLORI N/A 11/09/2014  ? Procedure: BREATH TEK H PYLORI;  Surgeon: Greer Pickerel, MD;  Location: [facility];  Service: General;  Laterality: N/A;  ? CARDIAC CATHETERIZATION  ~ 2006; 09/2013  ? CARDIAC CATHETERIZATION N/A 05/23/2016  ? Procedure: Right Heart Cath;  Surgeon: Larey Dresser, MD;  Location: [place] CV LAB;  Service: Cardiovascular;  Laterality: N/A;  ? CARDIAC DEFIBRILLATOR PLACEMENT  2006; 12/26/2013  ? Medtronic [id number]; 12-2013 ICD gen change and RV lead revision with new 6935 RV lead by Dr [name]  ? CENTRAL LINE INSERTION  08/28/2021  ? Procedure: CENTRAL LINE INSERTION;  Surgeon: Larey Dresser, MD;  Location: [place] CV LAB;   Service: Cardiovascular;;  ? [place]  ? IMPLANTABLE CARDIOVERTER DEFIBRILLATOR GENERATOR CHANGE N/A 12/26/2013  ? Procedure: IMPLANTABLE CARDIOVERTER DEFIBRILLATOR GENERATOR CHANGE;  Surgeon: Deboraha Sprang, MD;  Location: [facility] CATH LAB;  Service: Cardiovascular;  Laterality: N/A;  ? INSERTION OF IMPLANTABLE LEFT VENTRICULAR ASSIST DEVICE N/A 09/03/2021  ? Procedure: INSERTION OF IMPLANTABLE LEFT VENTRICULAR ASSIST DEVICE/ Heartmate 3;  Surgeon: Dahlia Byes, MD;  Location: [place];  Service: Open Heart Surgery;  Laterality: N/A;  ? IR CT HEAD LTD  04/17/2020  ? IR CT HEAD LTD  04/17/2020  ? IR INTRA CRAN STENT  04/17/2020  ? IR PERCUTANEOUS ART THROMBECTOMY/INFUSION INTRACRANIAL INC DIAG ANGIO  04/17/2020  ? IR RADIOLOGIST EVAL & MGMT  06/05/2020  ? LEAD REVISION N/A 12/26/2013  ? Procedure: LEAD REVISION;  Surgeon: Deboraha Sprang, MD;  Location: [facility] CATH LAB;  Service: Cardiovascular;  Laterality: N/A;  ? PLACEMENT OF IMPELLA LEFT VENTRICULAR ASSIST DEVICE Right 08/29/2021  ? Procedure: PLACEMENT OF IMPELLA 5.5 LEFT VENTRICULAR ASSIST DEVICE;  Surgeon: Gaye Pollack, MD;  Location: [place];  Service: Open Heart Surgery;  Laterality: Right;  ? RADIOLOGY WITH ANESTHESIA N/A 04/17/2020  ? Procedure: Code Stroke;  Surgeon: Luanne Bras, MD;  Location: [place];  Service: Radiology;  Laterality: N/A;  ? REMOVAL OF IMPELLA LEFT VENTRICULAR ASSIST DEVICE N/A 09/03/2021  ? Procedure: REMOVAL OF IMPELLA LEFT VENTRICULAR ASSIST DEVICE;  Surgeon: Dahlia Byes, MD;  Location:  [place] OR;  Service: Open Heart Surgery;  Laterality: N/A;  ? RIGHT HEART CATH N/A 08/28/2021  ? Procedure: RIGHT HEART CATH;  Surgeon: Larey Dresser, MD;  Location: [place] CV LAB;  Service: Cardiovascular;  Laterality: N/A;  ? RIGHT HEART CATHETERIZATION N/A 02/15/2014  ? Procedure: RIGHT HEART CATH;  Surgeon: Larey Dresser, MD;  Location: [facility] CATH LAB;  Service: Cardiovascular;  Laterality: N/A;  ? TEE WITHOUT CARDIOVERSION N/A 08/29/2021  ?  Procedure: TRANSESOPHAGEAL ECHOCARDIOGRAM (TEE);  Surgeon: Gaye Pollack, MD;  Location: [place];  Service: Open Heart Surgery;  Laterality: N/A;  ? TEE WITHOUT CARDIOVERSION N/A 09/03/2021  ? Procedure: TRANSESOPHAGEAL ECHOCARDIOGRAM (TEE);  Surgeon: Dahlia Byes, MD;  Location: [place];  Service: Open Heart Surgery;  Laterality: N/A;  ? TUBAL LIGATION  1999  ? ? ?Assessment / Plan / Recommendation ?Clinical Impression  Miranda Constantin. 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  Per chart review patient lives with spouse.  Two-level home 3 steps to entry.  Reportedly independent prior to admission no assistive device but was limited by occasional shortness of breath.  Presented 08/25/2021 with increasing shortness of breath as well as fatigue with occasional nausea and palpitations.  She also reports nonproductive cough x1 month.  Denied any fever or sore throat.  She did see her primary cardiology PA on 08/21/2021 who recommended she take metolazone given concern for possible volume overload.  In the ED heart rate 116-130, BP 92-1 19/59-83, respiratory 25 saturating 100% on room air, BNP 1117, troponin 27-29.  Chest x-ray showed cardiomegaly without pulmonary edema.  CT for pulmonary emboli was negative.  EKG showed sinus tachycardia with left bundle branch block.  Echocardiogram with ejection fraction less than 20%.  No thrombus noted.  Cardiology follow-up milrinone was initiated.  Patient  underwent insertion of implantable (LVAD) left ventricular assistive device 09/03/2021 per Dr. Darcey Nora.  Postoperative anemia receiving 1 unit packed red blood cells with latest hemoglobin 9.2.  She was slowly weaned off pressors as well as milrinone.  She continued to undergo diuresis.  Bouts of atrial fibrillation continued on p.o. amiodarone as well as digoxin.  Patient has been placed on Coumadin as well as remains on low-dose aspirin for history of CVA.[place]  Hospital course incidental finding of left ovarian mass noted on CT abdomen pelvis.  Unable to get MRI due to ICD.  Seen by GYN felt to be most likely nonneoplastic.  Tumor markers were within normal limits.  No ascites noted.  Patient not a surgical candidate at this time follow-up outpatient.  She did have some leukocytosis 30,100 suspect due to steroids that she had been placed on for hyperthyroidism no evidence for infection urine culture 50,000 gram-positive cocci.  Final culture with Enterococcus faecalis likely colonized completing course of vancomycin and cefazolin.  Decreased nutritional storage her diet has been advanced to regular she was placed on Remeron to help stimulate appetite.  She was requiring 12-hour tube feeds for nutritional supplement.  Palliative care was consulted to establish goals of care.  Therapy evaluations  completed due to patient decreased functional mobility was admitted for a comprehensive rehab program.  SLP evaluation was completed on 09/22/21 with results as follows: ? ?Bedside swallow evaluation  ?Pt presented with what at bedside appears to be grossly intact swallowing function.  She had timely and efficient mastication and transit of boluses orally.  No residue was evident in the oral cavity post swallow and she had no overt s/s of aspiration across solid or liquid consistencies.  Pt reported that certain solid textures seem to "get stuck" around her feeding tube and pointed to sternal notch to indicate location of globus  sensation.  She reported that she is easily able to mitigate globus with multiple sips of liquids.  Recommend that pt continue a regular diet and thin liquids, meds whole with liquids.  ? ?Cognitive-language e

## 2021-09-22 NOTE — Progress Notes (Addendum)
?                                                       PROGRESS NOTE ? ? ?Subjective/Complaints: ?Appreciate cardiology note, no new issues this am  ? ?Objective: ?  ?No results found. ?Recent Labs  ?  09/20/21 ?0259 09/22/21 ?0601  ?WBC 16.1* 9.7  ?HGB 9.2* 9.6*  ?HCT 31.3* 33.0*  ?PLT 271 260  ? ? ?Recent Labs  ?  09/21/21 ?1856 09/22/21 ?0601  ?NA 143 145  ?K 3.6 3.0*  ?CL 105 99  ?CO2 32 33*  ?GLUCOSE 105* 106*  ?BUN 32* 28*  ?CREATININE 1.06* 0.92  ?CALCIUM 8.8* 9.0  ? ? ? ?Intake/Output Summary (Last 24 hours) at 09/22/2021 1303 ?Last data filed at 09/22/2021 0802 ?Gross per 24 hour  ?Intake 340 ml  ?Output 2300 ml  ?Net -1960 ml  ? ?  ? ?  ? ?Physical Exam: ?Vital Signs ?Blood pressure 124/86, pulse 67, temperature 97.6 ?F (36.4 ?C), temperature source Oral, resp. rate 16, height '5\' 3"'$  (1.6 m), weight 99.3 kg, SpO2 100 %. ? ?General: No acute distress ?Mood and affect are appropriate ?Heart: Regular rate and rhythm no rubs murmurs or extra sounds ?Lungs: Clear to auscultation, breathing unlabored, no rales or wheezes ?Abdomen: Positive bowel sounds, soft nontender to palpation, nondistended ?Extremities: No clubbing, cyanosis, or edema ?Skin: No evidence of breakdown, no evidence of rash ?Neurologic: Cranial nerves II through XII intact, motor strength is 4/5 in bilateral deltoid, bicep, tricep, grip, hip flexor, knee extensors, ankle dorsiflexor and plantar flexor ?Sensory exam normal sensation to light touch and proprioception in bilateral upper and lower extremities ?Cerebellar exam normal finger to nose to finger as well as heel to shin in bilateral upper and lower extremities ?Musculoskeletal: Full range of motion in all 4 extremities. No joint swelling ? ?Skin epidermis open on drain wound x 2 , no drainage or erythema ? ?Assessment/Plan: ?1. Functional deficits which require 3+ hours per day of interdisciplinary therapy in a comprehensive inpatient rehab setting. ?Physiatrist is providing close team  supervision and 24 hour management of active medical problems listed below. ?Physiatrist and rehab team continue to assess barriers to discharge/monitor patient progress toward functional and medical goals ? ?Care Tool: ? ?Bathing ?   ?Body parts bathed by patient: Face, Right arm, Left arm, Chest, Abdomen, Right upper leg, Left upper leg, Right lower leg, Left lower leg  ?   ?  ?  ?Bathing assist Assist Level: Minimal Assistance - Patient > 75% ?  ?  ?Upper Body Dressing/Undressing ?Upper body dressing   ?What is the patient wearing?: Button up shirt ?   ?Upper body assist Assist Level: Minimal Assistance - Patient > 75% ?   ?Lower Body Dressing/Undressing ?Lower body dressing ? ? ?   ?What is the patient wearing?: Incontinence brief, Pants ? ?  ? ?Lower body assist Assist for lower body dressing: Minimal Assistance - Patient > 75% ?   ? ?Toileting ?Toileting Toileting Activity did not occur (Probation officer and hygiene only): N/A (no void or bm)  ?Toileting assist Assist for toileting: Maximal Assistance - Patient 25 - 49% ?  ?  ?Transfers ?Chair/bed transfer ? ?Transfers assist ?   ? ?Chair/bed transfer assist level: Minimal Assistance - Patient > 75% ?  ?  ?  Locomotion ?Ambulation ? ? ?Ambulation assist ? ?   ? ?Assist level: Contact Guard/Touching assist ?Assistive device: Walker-rolling ?Max distance: 175 ft  ? ?Walk 10 feet activity ? ? ?Assist ?   ? ?Assist level: Contact Guard/Touching assist ?Assistive device: Walker-rolling  ? ?Walk 50 feet activity ? ? ?Assist   ? ?Assist level: Contact Guard/Touching assist ?Assistive device: Walker-rolling  ? ? ?Walk 150 feet activity ? ? ?Assist   ? ?Assist level: Contact Guard/Touching assist ?Assistive device: Walker-rolling ?  ? ?Walk 10 feet on uneven surface  ?activity ? ? ?Assist Walk 10 feet on uneven surfaces activity did not occur: Safety/medical concerns ? ? ?  ?   ? ?Wheelchair ? ? ? ? ?Assist Is the patient using a wheelchair?: No ?Type of Wheelchair:  Manual ?Wheelchair activity did not occur: Safety/medical concerns ? ?  ?   ? ? ?Wheelchair 50 feet with 2 turns activity ? ? ? ?Assist ? ?  ?Wheelchair 50 feet with 2 turns activity did not occur: Safety/medical concerns ? ? ?   ? ?Wheelchair 150 feet activity  ? ? ? ?Assist ? Wheelchair 150 feet activity did not occur: Safety/medical concerns ? ? ?   ? ?Blood pressure 124/86, pulse 67, temperature 97.6 ?F (36.4 ?C), temperature source Oral, resp. rate 16, height '5\' 3"'$  (1.6 m), weight 99.3 kg, SpO2 100 %. ? ?Medical Problem List and Plan: ?1. Functional deficits secondary to debility/acute chronic systolic congestive heart failure/nonischemic cardiomyopathy with ICD status post LVAD 3/15. ?            -patient may not shower ?            -ELOS/Goals: 7-10 days, min assist/sup with PT,OT and mod I SLP ?2.  Antithrombotics: ?-DVT/anticoagulation:  Pharmaceutical: Coumadin ?            -antiplatelet therapy: Aspirin 81 mg daily ?3. Pain Management: Oxycodone as needed Neurontin 100 mg 3 times daily as needed ?4. Mood: Remeron 7.5 mg nightly, anxiety 0.25 mg 3 times daily as needed ?            -antipsychotic agents: N/A ?5. Neuropsych: This patient is capable of making decisions on her own behalf. ?6. Skin/Wound Care: Routine skin checks ?            -continue q-shift dressing changes to right shoulder hematoma ?7. Fluids/Electrolytes/Nutrition: Routine in and outs with follow-up chemistries during admit ?8.  Acute blood loss anemia.  Follow-up CBC ?9.  Hypotension.  ProAmatine 5 mg 3 times daily ?10.  Atrial fibrillation.  Continue amiodarone 400 mg twice daily per cardiology services as well as Lanoxin 0.0625 mg daily ?11.  CKD stage III.  Follow-up chemistries ? ?  Latest Ref Rng & Units 09/22/2021  ?  6:01 AM 09/21/2021  ?  5:43 AM 09/20/2021  ?  1:00 PM  ?BMP  ?Glucose 70 - 99 mg/dL 106   105   89    ?BUN 6 - 20 mg/dL 28   32   32    ?Creatinine 0.44 - 1.00 mg/dL 0.92   1.06   0.83    ?Sodium 135 - 145 mmol/L 145    143   142    ?Potassium 3.5 - 5.1 mmol/L 3.0   3.6   3.7    ?Chloride 98 - 111 mmol/L 99   105   105    ?CO2 22 - 32 mmol/L 33   32   26    ?  Calcium 8.9 - 10.3 mg/dL 9.0   8.8   8.7    ? Stable 4/1 ?12.  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  Continue aspirin therapy ?13.  Morbid obesity.  BMI 39.17.  Dietary follow-up ?            -pt aware of change needed in diet ?14.  Hyperthyroidism.  Continue Tapazole.  Patient initially hypothyroidism but has been not taking her medication for 1 month prior to admission.  Currently maintained on steroid therapy for hyperthyroid condition.  Per internal medicine continue prednisone 20 mg daily x1 month then reduce to 10 mg until endocrinology follow-up as outpatient ?15.  Hyperlipidemia.  Crestor ?16.  Left ovarian mass.  Noted on CT abdomen pelvis.  Unable to get MRI due to ICD.  Tumor markers within normal limits discussed with GYN.  Follow-up outpatient ?17.  Decreased nutritional storage.  Currently on a regular diet.  Patient has required nightly nocturnal tube feeds.   ?            -discussed with pt today. Told her we could take out NGT if she' eating enough by mouth. She has yet to take in PO consistently. Dietician monitoring intake reassess Monday  ? 18.  HypoK likely related to diuretic use TOrsemide reduced per Cardiology today which should help , already on KCL 65mq TID, Cardiology contacted repeat K+ recommended back at 3.5, improved.  Had been on spironolactone but this was stopped due to low BP ?LOS: ?2 days ?A FACE TO FACE EVALUATION WAS PERFORMED ? ?ALuanna SalkKirsteins ?09/22/2021, 1:03 PM  ? ? ? ?

## 2021-09-22 NOTE — Progress Notes (Signed)
Patient ID: [name], female   DOB: 1970-12-19, 52 y.o.   MRN: [id number] ?  ? ? Advanced Heart Failure Rounding Note ? ?PCP-Cardiologist: [name], MD  ? ?Subjective:   ? ?No complaints this morning. It is hard to obtain her BP.  She denies lightheadedness however, and creatinine is stable.  ? ?She remains in NSR on ECG this morning.   ? ?LVAD Interrogation HM 3: ?Speed: 5800 Flow: 4.8 PI: 3.1 Power: 5.  ? ? ?Objective:   ?Weight Range: ?100.9 kg ?Body mass index is 39.4 kg/m?.  ? ?Vital Signs:   ?Temp:  [97.9 ?F (36.6 ?C)-98.2 ?F (36.8 ?C)] 98 ?F (36.7 ?C) (04/02 4944) ?Pulse Rate:  [76-85] 83 (04/02 0832) ?Resp:  [16-18] 16 (04/02 0415) ?BP: (66-110)/(39-75) 66/39 (04/02 9675) ?SpO2:  [97 %-100 %] 97 % (04/02 0415) ?Last BM Date : 09/21/21 ? ?Weight change: ?Filed Weights  ? 09/20/21 1555  ?Weight: 100.9 kg  ? ? ?Intake/Output:  ? ?Intake/Output Summary (Last 24 hours) at 09/22/2021 0900 ?Last data filed at 09/22/2021 0802 ?Gross per 24 hour  ?Intake 640 ml  ?Output 2300 ml  ?Net -1660 ml  ?  ? ? ?Physical Exam  ?  ?General: Well appearing this am. NAD.  ?HEENT: Normal. ?Neck: Supple, JVP 7-8 cm. Carotids OK.  ?Cardiac:  Mechanical heart sounds with LVAD hum present.  ?Lungs:  CTAB, normal effort.  ?Abdomen:  NT, ND, no HSM. No bruits or masses. +BS  ?LVAD exit site: Well-healed and incorporated. Dressing dry and intact. No erythema or drainage. Stabilization device present and accurately applied. Driveline dressing changed daily per sterile technique. ?Extremities:  Warm and dry. No cyanosis, clubbing, rash, or edema.  ?Neuro:  Alert & oriented x 3. Cranial nerves grossly intact. Moves all 4 extremities w/o difficulty. Affect pleasant   ? ? ?Labs  ?  ?CBC ?Recent Labs  ?  09/20/21 ?0259 09/22/21 ?0601  ?WBC 16.1* 9.7  ?NEUTROABS 12.1*  --   ?HGB 9.2* 9.6*  ?HCT 31.3* 33.0*  ?MCV 97.2 94.8  ?PLT 271 260  ? ?Basic Metabolic Panel ?Recent Labs  ?  09/20/21 ?0259 09/20/21 ?1300 09/21/21 ?9163 09/22/21 ?0601  ?NA  140   < > 143 145  ?K 5.4*   < > 3.6 3.0*  ?CL 107   < > 105 99  ?CO2 28   < > 32 33*  ?GLUCOSE 110*   < > 105* 106*  ?BUN 40*   < > 32* 28*  ?CREATININE 1.00   < > 1.06* 0.92  ?CALCIUM 9.1   < > 8.8* 9.0  ?MG 2.2  --   --   --   ? < > = values in this interval not displayed.  ? ?Liver Function Tests ?No results for input(s): AST, ALT, ALKPHOS, BILITOT, PROT, ALBUMIN in the last 72 hours. ?No results for input(s): LIPASE, AMYLASE in the last 72 hours. ?Cardiac Enzymes ?No results for input(s): CKTOTAL, CKMB, CKMBINDEX, TROPONINI in the last 72 hours. ? ?BNP: ?BNP (last 3 results) ?Recent Labs  ?  09/04/21 ?8466 09/10/21 ?5993 09/17/21 ?0140  ?BNP 358.8* 521.3* 279.4*  ? ? ?ProBNP (last 3 results) ?No results for input(s): PROBNP in the last 8760 hours. ? ? ?D-Dimer ?No results for input(s): DDIMER in the last 72 hours. ?Hemoglobin A1C ?No results for input(s): HGBA1C in the last 72 hours. ?Fasting Lipid Panel ?No results for input(s): CHOL, HDL, LDLCALC, TRIG, CHOLHDL, LDLDIRECT in the last 72 hours. ?Thyroid Function  Tests ?No results for input(s): TSH, T4TOTAL, T3FREE, THYROIDAB in the last 72 hours. ? ?Invalid input(s): FREET3 ? ?Other results: ? ? ?Imaging  ? ? ?No results found. ? ? ?Medications:   ? ? ?Scheduled Medications: ? amiodarone  400 mg Oral BID  ? aspirin EC  81 mg Oral Daily  ? Chlorhexidine Gluconate Cloth  6 each Topical Q12H  ? digoxin  0.0625 mg Oral Daily  ? feeding supplement (PIVOT 1.5 CAL)  840 mL Per Tube Q24H  ? insulin aspart  0-24 Units Subcutaneous TID WC & HS  ? methimazole  10 mg Oral Daily  ? methimazole  5 mg Oral QPM  ? midodrine  5 mg Oral TID WC  ? mirtazapine  7.5 mg Oral QHS  ? pantoprazole  40 mg Oral Daily  ? potassium chloride  40 mEq Oral TID  ? predniSONE  20 mg Oral Q breakfast  ? quiniDINE gluconate  324 mg Oral BID  ? rosuvastatin  40 mg Oral QHS  ? sildenafil  20 mg Oral TID  ? [START ON 09/23/2021] torsemide  40 mg Oral Daily  ? Warfarin - Pharmacist Dosing Inpatient    Does not apply [id number]  ? ? ?Infusions: ? ? ?PRN Medications: ?ALPRAZolam, gabapentin, loperamide HCl, senna-docusate, sodium chloride, traMADol ? ? ?Assessment/Plan  ? ? 1. Acute on chronic systolic HF: Nonischemic cardiomyopathy.  She has a Medtronic ICD. Cause for cardiomyopathy is uncertain, initially identified peri-partum (after son's birth), so cannot rule out peri-partum CMP.  On and off, she has had frequent PVCs.   RHC in 8/15 showed normal filling pressures and preserved cardiac output. Echo in 4/22 with EF 25%, severe LV dilation.  CPX (8/22) with mild-moderate HF limitation.  Echo this admission with EF < 20% with severe dilation, normal RV size with mildly decreased systolic function, moderate-severe functional MR with dilated IVC. She was admitted with volume overload, NYHA class IV symptoms and low output. HCO3 18 and creatinine 1.5 on admit, co-ox 47%. Milrinone 0.25 started + lasix gtt.  I also suspect hyperthyroidism is playing a role in her CHF exacerbation. 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 Impella 5.5 placed 3/9.  Patient had HM3 LVAD placed 3/15. 3/15 Speed increased to 5300 rpm. 3/19 Speed increased to 5400. Ramp echo 3/22, fixed speed increased to 5600.  Ramp echo 3/29, speed increased to 5800. Stable currently, volume status improved and weight has trended down.  BP is difficult to obtain but creatinine stable and no lightheadedness.  ?- Decrease torsemide to 40 mg daily.  ?- Aggressive K replacement.    ?- Off spiro with soft BP ?- Continue Digoxin 0.0625 mg, check level in am.  ?- Continue asa 81 mg daily. Will need long-term with h/o CVA ?- Continue sildenafil 20 mg tid  ?- Warfarin for VAD. INR 3.1. Dosing per pharmD.  ?- Continue midodrine 5 mg TID.  ?2. 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  She has been on Levoxyl at home but  does not think she has taken for a month.  Now hyperthyroid.  This likely contributes to her CHF exacerbation. [facility] evaluated her for this. 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 Solumedrol 20 mg IV daily -> transitioned to prednisone 20 on 3/16 and methimazole bid.  ?- per IM, continue prednisone 20 mg x 1 month (4/16), then reduce down  to 10 mg until endo follow-up  ?- Will need outpatient endocrinology followup.  ?- Continuing amiodarone for now with NSVT and rapid afib. See below. ?3. PVCs/NSVT: EP consulted given complicated situation with hyperthyroidism and NSVT. They favored continuing amiodarone and quinidine at this time.  Would eventually like to stop amio given hyperthyroidism.  ?4. CVA: CVA in 10/21, had thrombectomy with stent placement.  No LV thrombus noted and no atrial fibrillation noted.  ?- Talked with interventional radiology. >1 year post-stent, ok to stop ticagrelor and continue ASA.  ?5. Anemia: Post-op with chest tube output.  3/15, 3/16 1UPRBCs.  ?- Transfuse hgb < 8.  ?6. CKD stage 3: Renal function stable, creatinine 0.92 today.   ?7. Mitral regurgitation: Moderate-severe functional MR.  Unlikely to be Mitraclip candidate with severely dilated LV (>7 cm). Hopefully will improve with LVAD.  ?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  Unable to get MRI with ICD. Seen by GYN. Felt to be most likely non-neoplastic. Tumor markers within normal range. Possible low grade malignancy, could also be benign.  No ascites noted on abdominal CT and no mention of abdominal spread.  She is not a surgical candidate at this time with low output HF.  ?- Plan at this point would be to stabilize with LVAD then investigate the ovarian mass.  ?- F/u with GYN as outpatient ?9. Elevated WBCs: Thought to be due to prednisone.  ?10. Malnutrition: Appetite improving.  ?- Remeron added to stimulate appetite  ?-  Now with nocturnal TFs via [employer], may be able to remove Monday.  ?11. VT:  Paced out on 3/16.   ?- Continue amiodarone and quinidine.  ?- Keep K > 4.0 Mg > 2.0 ?12. Hypokalemia: Follow K daily.  ?13

## 2021-09-22 NOTE — Discharge Instructions (Addendum)
Inpatient Rehab Discharge Instructions ? ?[name] ?Discharge date and time: No discharge date for patient encounter.  ? ?Activities/Precautions/ Functional Status: ?Activity: As tolerated ?Diet: Regular ?Wound Care: Routine skin checks ?Functional status:  ?___ No restrictions     ___ Walk up steps independently ?___ 24/7 supervision/assistance   ___ Walk up steps with assistance ?___ Intermittent supervision/assistance  ___ Bathe/dress independently ?___ Walk with walker     __x_ Bathe/dress with assistance ?___ Walk Independently    ___ Shower independently ?___ Walk with assistance    ___ Shower with assistance ?___ No alcohol     ___ Return to work/school ________ ? ?COMMUNITY REFERRALS UPON DISCHARGE:   ? ?Outpatient: PT     OT                Agency: [place] [facility]  Phone: [phone]  ?            Appointment Date/Time: TBD ? ?Medical Equipment/Items Ordered: Hospital Bed, Bedside Commode and Rollator  ?                                                Agency/Supplier: [facility] [phone] ? ?Special Instructions: ?No driving smoking or alcohol ? ?Ambulatory referral obtained to endocrinology for hyperthyroidism ? ?Follow-up with OB/GYN for left ovarian mass ? ?Home health nurse to check INR 09/30/2021 results to [facility] health heart care Coumadin clinic [phone] fax [phone] ? ? ? ? ?My questions have been answered and I understand these instructions. I will adhere to these goals and the provided educational materials after my discharge from the hospital. ? ?Patient/Caregiver Signature _______________________________ Date __________ ? ?Clinician Signature _______________________________________ Date __________ ? ?Please bring this form and your medication list with you to all your follow-up doctor's appointments.   ?

## 2021-09-23 ENCOUNTER — Encounter (HOSPITAL_COMMUNITY): Payer: Self-pay | Admitting: Physical Medicine & Rehabilitation

## 2021-09-23 ENCOUNTER — Inpatient Hospital Stay (HOSPITAL_COMMUNITY): Payer: BC Managed Care – PPO

## 2021-09-23 DIAGNOSIS — R7401 Elevation of levels of liver transaminase levels: Secondary | ICD-10-CM

## 2021-09-23 DIAGNOSIS — E876 Hypokalemia: Secondary | ICD-10-CM | POA: Diagnosis not present

## 2021-09-23 DIAGNOSIS — Z95811 Presence of heart assist device: Secondary | ICD-10-CM | POA: Diagnosis not present

## 2021-09-23 DIAGNOSIS — R5381 Other malaise: Secondary | ICD-10-CM | POA: Diagnosis not present

## 2021-09-23 DIAGNOSIS — I480 Paroxysmal atrial fibrillation: Secondary | ICD-10-CM | POA: Diagnosis not present

## 2021-09-23 LAB — CBC WITH DIFFERENTIAL/PLATELET
Abs Immature Granulocytes: 0 10*3/uL (ref 0.00–0.07)
Basophils Absolute: 0.1 10*3/uL (ref 0.0–0.1)
Basophils Relative: 1 %
Eosinophils Absolute: 0 10*3/uL (ref 0.0–0.5)
Eosinophils Relative: 0 %
HCT: 32.8 % — ABNORMAL LOW (ref 36.0–46.0)
Hemoglobin: 9.6 g/dL — ABNORMAL LOW (ref 12.0–15.0)
Lymphocytes Relative: 8 %
Lymphs Abs: 0.7 10*3/uL (ref 0.7–4.0)
MCH: 28 pg (ref 26.0–34.0)
MCHC: 29.3 g/dL — ABNORMAL LOW (ref 30.0–36.0)
MCV: 95.6 fL (ref 80.0–100.0)
Monocytes Absolute: 0.9 10*3/uL (ref 0.1–1.0)
Monocytes Relative: 10 %
Neutro Abs: 7.5 10*3/uL (ref 1.7–7.7)
Neutrophils Relative %: 81 %
Platelets: 259 10*3/uL (ref 150–400)
RBC: 3.43 MIL/uL — ABNORMAL LOW (ref 3.87–5.11)
RDW: 21.4 % — ABNORMAL HIGH (ref 11.5–15.5)
WBC: 9.3 10*3/uL (ref 4.0–10.5)
nRBC: 0 % (ref 0.0–0.2)
nRBC: 1 /100 WBC — ABNORMAL HIGH

## 2021-09-23 LAB — DIGOXIN LEVEL: Digoxin Level: 0.3 ng/mL — ABNORMAL LOW (ref 0.8–2.0)

## 2021-09-23 LAB — COMPREHENSIVE METABOLIC PANEL
ALT: 129 U/L — ABNORMAL HIGH (ref 0–44)
AST: 79 U/L — ABNORMAL HIGH (ref 15–41)
Albumin: 2.6 g/dL — ABNORMAL LOW (ref 3.5–5.0)
Alkaline Phosphatase: 228 U/L — ABNORMAL HIGH (ref 38–126)
Anion gap: 6 (ref 5–15)
BUN: 28 mg/dL — ABNORMAL HIGH (ref 6–20)
CO2: 31 mmol/L (ref 22–32)
Calcium: 8.9 mg/dL (ref 8.9–10.3)
Chloride: 102 mmol/L (ref 98–111)
Creatinine, Ser: 0.84 mg/dL (ref 0.44–1.00)
GFR, Estimated: >60 mL/min (ref >60)
Glucose, Bld: 91 mg/dL (ref 70–99)
Potassium: 3.7 mmol/L (ref 3.5–5.1)
Sodium: 139 mmol/L (ref 135–145)
Total Bilirubin: 0.4 mg/dL (ref 0.3–1.2)
Total Protein: 5.6 g/dL — ABNORMAL LOW (ref 6.5–8.1)

## 2021-09-23 LAB — PROTIME-INR
INR: 3.1 — ABNORMAL HIGH (ref 0.8–1.2)
Prothrombin Time: 32.2 seconds — ABNORMAL HIGH (ref 11.4–15.2)

## 2021-09-23 LAB — GLUCOSE, CAPILLARY
Glucose-Capillary: 118 mg/dL — ABNORMAL HIGH (ref 70–99)
Glucose-Capillary: 114 mg/dL — ABNORMAL HIGH (ref 70–99)
Glucose-Capillary: 129 mg/dL — ABNORMAL HIGH (ref 70–99)

## 2021-09-23 LAB — LACTATE DEHYDROGENASE: LDH: 361 U/L — ABNORMAL HIGH (ref 98–192)

## 2021-09-23 MED ORDER — POTASSIUM CHLORIDE 20 MEQ PO PACK
60.0000 meq | PACK | Freq: Three times a day (TID) | ORAL | Status: DC
  Filled 2021-09-23: qty 3

## 2021-09-23 MED ORDER — POTASSIUM CHLORIDE CRYS ER 10 MEQ PO TBCR
60.0000 meq | EXTENDED_RELEASE_TABLET | Freq: Three times a day (TID) | ORAL | Status: DC
  Administered 2021-09-24: 60 meq via ORAL
  Filled 2021-09-23 (×2): qty 6

## 2021-09-23 MED ORDER — PATIENT'S GUIDE TO USING COUMADIN BOOK
Freq: Once | Status: AC
  Filled 2021-09-23 (×2): qty 1

## 2021-09-23 MED ORDER — WARFARIN SODIUM 1 MG PO TABS
1.5000 mg | ORAL_TABLET | Freq: Once | ORAL | Status: AC
  Administered 2021-09-23: 1.5 mg via ORAL
  Filled 2021-09-23: qty 1

## 2021-09-23 MED ORDER — POTASSIUM CHLORIDE CRYS ER 20 MEQ PO TBCR
60.0000 meq | EXTENDED_RELEASE_TABLET | Freq: Three times a day (TID) | ORAL | Status: DC
  Filled 2021-09-23: qty 3

## 2021-09-23 MED ORDER — SODIUM CHLORIDE 0.9% FLUSH
10.0000 mL | INTRAVENOUS | Status: DC | PRN
  Administered 2021-09-25: 10 mL

## 2021-09-23 NOTE — Progress Notes (Signed)
?                                                       PROGRESS NOTE ? ? ?Subjective/Complaints: ?Pt felt some "PVC's" last night. Seemed to gradually subside and feels well today. Po intake has improved quite a bit. She's keeping a log of her intake as well ? ?ROS: Patient denies fever, rash, sore throat, blurred vision, dizziness, nausea, vomiting, diarrhea, cough,  joint or back/neck pain, headache, or mood change.  ? ? ?Objective: ?  ?DG Chest Port 1 View ? ?Result Date: 09/23/2021 ?CLINICAL DATA:  Acute respiratory distress EXAM: PORTABLE CHEST 1 VIEW COMPARISON:  09/18/2021 FINDINGS: Postoperative changes in the mediastinum. Cardiac pacemaker. Left ventricular assist device. Enteric tube. Appliances are unchanged since prior study. Shallow inspiration. Infiltration or atelectasis in the right lower lung is unchanged. No pleural effusions. No pneumothorax. Diffuse enlargement of the cardiac silhouette. Surgical clips in the right axilla. IMPRESSION: No change since prior study. Persistent enlargement of the cardiac silhouette. Persistent infiltration or atelectasis in the right lung base. Electronically Signed   By: Lucienne Capers M.D.   On: 09/23/2021 03:03   ?Recent Labs  ?  09/22/21 ?0601 09/23/21 ?0825  ?WBC 9.7 9.3  ?HGB 9.6* 9.6*  ?HCT 33.0* 32.8*  ?PLT 260 259  ? ?Recent Labs  ?  09/22/21 ?0601 09/22/21 ?2011 09/23/21 ?0825  ?NA 145  --  139  ?K 3.0* 3.5 3.7  ?CL 99  --  102  ?CO2 33*  --  31  ?GLUCOSE 106*  --  91  ?BUN 28*  --  28*  ?CREATININE 0.92  --  0.84  ?CALCIUM 9.0  --  8.9  ? ? ?Intake/Output Summary (Last 24 hours) at 09/23/2021 1104 ?Last data filed at 09/23/2021 0900 ?Gross per 24 hour  ?Intake 478 ml  ?Output 1000 ml  ?Net -522 ml  ?  ? ?  ? ?Physical Exam: ?Vital Signs ?Blood pressure 92/65, pulse 81, temperature 98 ?F (36.7 ?C), resp. rate 18, height '5\' 3"'$  (1.6 m), weight 99.3 kg, SpO2 100 %. ? ?Constitutional: No distress . Vital signs reviewed. ?HEENT: NCAT, EOMI, oral membranes moist,  NGT ?Neck: supple ?Cardiovascular: LVAD hum, drive site clean.     ?Respiratory/Chest: CTA Bilaterally without wheezes or rales. Normal effort    ?GI/Abdomen: BS +, non-tender, non-distended ?Ext: no clubbing, cyanosis, or edema ?Psych: pleasant and cooperative  ?Skin: No evidence of breakdown, no evidence of rash ?Neurologic: Cranial nerves II through XII intact, motor strength is 4/5 in bilateral deltoid, bicep, tricep, grip, hip flexor, knee extensors, ankle dorsiflexor and plantar flexor ?Sensory exam normal for light touch and pain in all 4 limbs. No limb ataxia or cerebellar signs. No abnormal tone appreciated.   ?Musculoskeletal: Full range of motion in all 4 extremities. No joint swelling ? ?Skin epidermis open on drain wound x 2 , no drainage or erythema ? ?Assessment/Plan: ?1. Functional deficits which require 3+ hours per day of interdisciplinary therapy in a comprehensive inpatient rehab setting. ?Physiatrist is providing close team supervision and 24 hour management of active medical problems listed below. ?Physiatrist and rehab team continue to assess barriers to discharge/monitor patient progress toward functional and medical goals ? ?Care Tool: ? ?Bathing ?   ?Body parts bathed by patient: Face, Right arm, Left  arm, Chest, Abdomen, Right upper leg, Left upper leg, Right lower leg, Left lower leg  ?   ?  ?  ?Bathing assist Assist Level: Minimal Assistance - Patient > 75% ?  ?  ?Upper Body Dressing/Undressing ?Upper body dressing   ?What is the patient wearing?: Button up shirt ?   ?Upper body assist Assist Level: Minimal Assistance - Patient > 75% ?   ?Lower Body Dressing/Undressing ?Lower body dressing ? ? ?   ?What is the patient wearing?: Incontinence brief, Pants ? ?  ? ?Lower body assist Assist for lower body dressing: Minimal Assistance - Patient > 75% ?   ? ?Toileting ?Toileting Toileting Activity did not occur (Probation officer and hygiene only): N/A (no void or bm)  ?Toileting assist  Assist for toileting: Maximal Assistance - Patient 25 - 49% ?  ?  ?Transfers ?Chair/bed transfer ? ?Transfers assist ?   ? ?Chair/bed transfer assist level: Minimal Assistance - Patient > 75% ?  ?  ?Locomotion ?Ambulation ? ? ?Ambulation assist ? ?   ? ?Assist level: Contact Guard/Touching assist ?Assistive device: Walker-rolling ?Max distance: 175 ft  ? ?Walk 10 feet activity ? ? ?Assist ?   ? ?Assist level: Contact Guard/Touching assist ?Assistive device: Walker-rolling  ? ?Walk 50 feet activity ? ? ?Assist   ? ?Assist level: Contact Guard/Touching assist ?Assistive device: Walker-rolling  ? ? ?Walk 150 feet activity ? ? ?Assist   ? ?Assist level: Contact Guard/Touching assist ?Assistive device: Walker-rolling ?  ? ?Walk 10 feet on uneven surface  ?activity ? ? ?Assist Walk 10 feet on uneven surfaces activity did not occur: Safety/medical concerns ? ? ?  ?   ? ?Wheelchair ? ? ? ? ?Assist Is the patient using a wheelchair?: No ?Type of Wheelchair: Manual ?Wheelchair activity did not occur: Safety/medical concerns ? ?  ?   ? ? ?Wheelchair 50 feet with 2 turns activity ? ? ? ?Assist ? ?  ?Wheelchair 50 feet with 2 turns activity did not occur: Safety/medical concerns ? ? ?   ? ?Wheelchair 150 feet activity  ? ? ? ?Assist ? Wheelchair 150 feet activity did not occur: Safety/medical concerns ? ? ?   ? ?Blood pressure 92/65, pulse 81, temperature 98 ?F (36.7 ?C), resp. rate 18, height '5\' 3"'$  (1.6 m), weight 99.3 kg, SpO2 100 %. ? ?Medical Problem List and Plan: ?1. Functional deficits secondary to debility/acute chronic systolic congestive heart failure/nonischemic cardiomyopathy with ICD status post LVAD 3/15. ?            -patient may not shower ?            -ELOS/Goals: 7-10 days, min assist/sup with PT,OT and mod I SLP ?2.  Antithrombotics: ?-DVT/anticoagulation:  Pharmaceutical: Coumadin ?            -antiplatelet therapy: Aspirin 81 mg daily ?3. Pain Management: Oxycodone as needed Neurontin 100 mg 3 times daily as  needed ?4. Mood: Remeron 7.5 mg nightly, anxiety 0.25 mg 3 times daily as needed ?            -antipsychotic agents: N/A ?5. Neuropsych: This patient is capable of making decisions on her own behalf. ?6. Skin/Wound Care: Routine skin checks ?            -continue q-shift dressing changes to right shoulder hematoma ?7. Fluids/Electrolytes/Nutrition: Routine in and outs with follow-up chemistries during admit ?8.  Acute blood loss anemia.  Follow-up CBC ?9.  Hypotension.  ProAmatine 5 mg  3 times daily ?10.  Atrial fibrillation.  Continue amiodarone 400 mg twice daily per cardiology services as well as Lanoxin 0.0625 mg daily ? 4/3 had palpitations last night. Were self-limited. observe ?11.  CKD stage III.  Follow-up chemistries ? ?  Latest Ref Rng & Units 09/23/2021  ?  8:25 AM 09/22/2021  ?  8:11 PM 09/22/2021  ?  6:01 AM  ?BMP  ?Glucose 70 - 99 mg/dL 91    106    ?BUN 6 - 20 mg/dL 28    28    ?Creatinine 0.44 - 1.00 mg/dL 0.84    0.92    ?Sodium 135 - 145 mmol/L 139    145    ?Potassium 3.5 - 5.1 mmol/L 3.7   3.5   3.0    ?Chloride 98 - 111 mmol/L 102    99    ?CO2 22 - 32 mmol/L 31    33    ?Calcium 8.9 - 10.3 mg/dL 8.9    9.0    ? Stable 4/3 ?12.  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  Continue aspirin therapy ?13.  Morbid obesity.  BMI 39.17.  Dietary follow-up ?            -pt aware of change needed in diet ?14.  Hyperthyroidism.  Continue Tapazole.  Patient initially hypothyroidism but has been not taking her medication for 1 month prior to admission.  Currently maintained on steroid therapy for hyperthyroid condition.  Per internal medicine continue prednisone 20 mg daily x1 month then reduce to 10 mg until endocrinology follow-up as outpatient ?15.  Hyperlipidemia.  Crestor ?16.  Left ovarian mass.  Noted on CT abdomen pelvis.  Unable to get MRI due to ICD.  Tumor markers within normal limits discussed with GYN.  Follow-up outpatient ?17.  Decreased nutritional storage.  Currently on a  regular diet.  Patient has required nightly nocturnal tube feeds.   ?            4/3-pt eating 100% of meals. Keeping records to prove it! ?   -wants NGT out. Will dc  ? 18.  HypoK likely related to diuret

## 2021-09-23 NOTE — Progress Notes (Signed)
ANTICOAGULATION CONSULT NOTE - Follow Up Consult ? ?Pharmacy Consult for Warfarin ?Indication: LVAD HM3 ? ?No Known Allergies ? ?Patient Measurements: ?Height: '5\' 3"'$  (160 cm) ?Weight: 99.3 kg (218 lb 14.4 oz) ?IBW/kg (Calculated) : 52.4 ? ?Vital Signs: ?Temp: 98 ?F (36.7 ?C) (04/03 0744) ?Temp Source: Oral (04/03 0422) ?BP: 92/65 (04/03 0744) ?Pulse Rate: 81 (04/03 0744) ? ?Labs: ?Recent Labs  ?  09/21/21 ?8841 09/22/21 ?0601 09/23/21 ?0825  ?HGB  --  9.6* 9.6*  ?HCT  --  33.0* 32.8*  ?PLT  --  260 259  ?LABPROT 29.2* 31.9* 32.2*  ?INR 2.8* 3.1* 3.1*  ?CREATININE 1.06* 0.92 0.84  ? ? ? ?Estimated Creatinine Clearance: 90.1 mL/min (by C-G formula based on SCr of 0.84 mg/dL). ? ? ?Medications:  ?Scheduled:  ? amiodarone  400 mg Oral BID  ? aspirin EC  81 mg Oral Daily  ? Chlorhexidine Gluconate Cloth  6 each Topical Q12H  ? digoxin  0.0625 mg Oral Daily  ? insulin aspart  0-24 Units Subcutaneous TID WC & HS  ? methimazole  10 mg Oral Daily  ? methimazole  5 mg Oral QPM  ? midodrine  5 mg Oral TID WC  ? mirtazapine  7.5 mg Oral QHS  ? pantoprazole  40 mg Oral Daily  ? potassium chloride  40 mEq Oral TID  ? predniSONE  20 mg Oral Q breakfast  ? quiniDINE gluconate  324 mg Oral BID  ? rosuvastatin  40 mg Oral QHS  ? sildenafil  20 mg Oral TID  ? torsemide  40 mg Oral Daily  ? warfarin  1.5 mg Oral ONCE-1600  ? Warfarin - Pharmacist Dosing Inpatient   Does not apply [id number]  ? ? ?Assessment: ?51 yo F who is s/p HM3 LVAD placement on 3/14. Patient has been started on warfarin and aspirin. Aspirin '81mg'$  will be continued indefinitely given prior CVA.  ? ?INR is slightly above goal at 3.1, pt reporting great appetite. LDH and CBC stable. ? ?Goal of Therapy:  ?INR 2-2.5 ?Monitor platelets by anticoagulation protocol: Yes ?  ?Plan:  ?Warfarin 1.5 mg x1 again today. ?Daily INR, CBC.  ?Monitor for signs/symptoms of bleeding. ? ? ?Arrie Senate, PharmD, BCPS, BCCP ?Clinical Pharmacist ?[phone] ?Please check AMION for all [place] numbers ?09/23/2021 ? ?

## 2021-09-23 NOTE — Progress Notes (Addendum)
Patient ID: [name], female   DOB: Mar 13, 1971, 51 y.o.   MRN: [id number] ?  ? ? Advanced Heart Failure Rounding Note ? ?PCP-Cardiologist: [name], MD  ? ?Subjective:   ?Yesterday torsemide cut back to 40 mg daily.  ? ?Complaining of palpitations overnight. Feels better today  ? ?LVAD Interrogation HM 3: ?Speed: 5800 Flow: 4.8 PI: 3.1 Power: 4.5 No PI over night.  ? ? ?Objective:   ?Weight Range: ?99.3 kg ?Body mass index is 38.78 kg/m?.  ? ?Vital Signs:   ?Temp:  [97.6 ?F (36.4 ?C)-98.8 ?F (37.1 ?C)] 98.8 ?F (37.1 ?C) (04/03 0422) ?Pulse Rate:  [60-85] 82 (04/03 0422) ?Resp:  [17-19] 19 (04/03 0422) ?BP: (66-124)/(39-86) 82/67 (04/03 0422) ?SpO2:  [97 %-100 %] 100 % (04/03 0422) ?Weight:  [99.3 kg] 99.3 kg (04/02 0914) ?Last BM Date : 09/22/21 ? ?Weight change: ?Filed Weights  ? 09/20/21 1555 09/22/21 0914  ?Weight: 100.9 kg 99.3 kg  ? ? ?Intake/Output:  ? ?Intake/Output Summary (Last 24 hours) at 09/23/2021 0720 ?Last data filed at 09/23/2021 0429 ?Gross per 24 hour  ?Intake 480 ml  ?Output 1000 ml  ?Net -520 ml  ?  ? ? ?Physical Exam  ?Physical Exam: ?GENERAL: No acute distress. ?HEENT: + [place] ?NECK: Supple, JVP  flat  2+ bilaterally, no bruits.  No lymphadenopathy or thyromegaly appreciated.   ?CARDIAC:  Mechanical heart sounds with LVAD hum present.  ?LUNGS:  Clear to auscultation bilaterally.  ?ABDOMEN:  Soft, round, nontender, positive bowel sounds x4.     ?LVAD exit site:  Dressing dry and intact.  No erythema or drainage.  Stabilization device present and accurately applied.  Driveline dressing is being changed daily per sterile technique. ?EXTREMITIES:  Warm and dry, no cyanosis, clubbing, rash or edema  ?NEUROLOGIC:  Alert and oriented x 3.    No aphasia.  No dysarthria.  Affect pleasant.    ? ? ?Labs  ?  ?CBC ?Recent Labs  ?  09/22/21 ?0601  ?WBC 9.7  ?HGB 9.6*  ?HCT 33.0*  ?MCV 94.8  ?PLT 260  ? ?Basic Metabolic Panel ?Recent Labs  ?  09/21/21 ?0543 09/22/21 ?0601 09/22/21 ?2011  ?NA 143 145  --    ?K 3.6 3.0* 3.5  ?CL 105 99  --   ?CO2 32 33*  --   ?GLUCOSE 105* 106*  --   ?BUN 32* 28*  --   ?CREATININE 1.06* 0.92  --   ?CALCIUM 8.8* 9.0  --   ? ?Liver Function Tests ?No results for input(s): AST, ALT, ALKPHOS, BILITOT, PROT, ALBUMIN in the last 72 hours. ?No results for input(s): LIPASE, AMYLASE in the last 72 hours. ?Cardiac Enzymes ?No results for input(s): CKTOTAL, CKMB, CKMBINDEX, TROPONINI in the last 72 hours. ? ?BNP: ?BNP (last 3 results) ?Recent Labs  ?  09/04/21 ?7858 09/10/21 ?8502 09/17/21 ?0140  ?BNP 358.8* 521.3* 279.4*  ? ? ?ProBNP (last 3 results) ?No results for input(s): PROBNP in the last 8760 hours. ? ? ?D-Dimer ?No results for input(s): DDIMER in the last 72 hours. ?Hemoglobin A1C ?No results for input(s): HGBA1C in the last 72 hours. ?Fasting Lipid Panel ?No results for input(s): CHOL, HDL, LDLCALC, TRIG, CHOLHDL, LDLDIRECT in the last 72 hours. ?Thyroid Function Tests ?No results for input(s): TSH, T4TOTAL, T3FREE, THYROIDAB in the last 72 hours. ? ?Invalid input(s): FREET3 ? ?Other results: ? ? ?Imaging  ? ? ?DG Chest Port 1 View ? ?Result Date: 09/23/2021 ?CLINICAL DATA:  Acute respiratory distress  EXAM: PORTABLE CHEST 1 VIEW COMPARISON:  09/18/2021 FINDINGS: Postoperative changes in the mediastinum. Cardiac pacemaker. Left ventricular assist device. Enteric tube. Appliances are unchanged since prior study. Shallow inspiration. Infiltration or atelectasis in the right lower lung is unchanged. No pleural effusions. No pneumothorax. Diffuse enlargement of the cardiac silhouette. Surgical clips in the right axilla. IMPRESSION: No change since prior study. Persistent enlargement of the cardiac silhouette. Persistent infiltration or atelectasis in the right lung base. Electronically Signed   By: Lucienne Capers M.D.   On: 09/23/2021 03:03   ? ? ?Medications:   ? ? ?Scheduled Medications: ? amiodarone  400 mg Oral BID  ? aspirin EC  81 mg Oral Daily  ? Chlorhexidine Gluconate Cloth  6 each  Topical Q12H  ? digoxin  0.0625 mg Oral Daily  ? feeding supplement (PIVOT 1.5 CAL)  840 mL Per Tube Q24H  ? insulin aspart  0-24 Units Subcutaneous TID WC & HS  ? methimazole  10 mg Oral Daily  ? methimazole  5 mg Oral QPM  ? midodrine  5 mg Oral TID WC  ? mirtazapine  7.5 mg Oral QHS  ? pantoprazole  40 mg Oral Daily  ? potassium chloride  40 mEq Oral TID  ? predniSONE  20 mg Oral Q breakfast  ? quiniDINE gluconate  324 mg Oral BID  ? rosuvastatin  40 mg Oral QHS  ? sildenafil  20 mg Oral TID  ? torsemide  40 mg Oral Daily  ? Warfarin - Pharmacist Dosing Inpatient   Does not apply I0973  ? ? ?Infusions: ? ? ?PRN Medications: ?ALPRAZolam, gabapentin, loperamide HCl, senna-docusate, sodium chloride, traMADol ? ? ?Assessment/Plan  ? ? 1. Acute on chronic systolic HF: Nonischemic cardiomyopathy.  She has a [employer] ICD. Cause for cardiomyopathy is uncertain, initially identified peri-partum (after son's birth), so cannot rule out peri-partum CMP.  On and off, she has had frequent PVCs.   RHC in 8/15 showed normal filling pressures and preserved cardiac output. Echo in 4/22 with EF 25%, severe LV dilation.  CPX (8/22) with mild-moderate HF limitation.  Echo this admission with EF < 20% with severe dilation, normal RV size with mildly decreased systolic function, moderate-severe functional MR with dilated IVC. She was admitted with volume overload, NYHA class IV symptoms and low output. HCO3 18 and creatinine 1.5 on admit, co-ox 47%. Milrinone 0.25 started + lasix gtt.  I also suspect hyperthyroidism is playing a role in her CHF exacerbation. 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 Impella 5.5 placed 3/9.  Patient had HM3 LVAD placed 3/15. 3/15 Speed increased to 5300 rpm. 3/19 Speed increased to 5400. Ramp echo 3/22, fixed speed increased to 5600.  Ramp echo 3/29, speed increased to 5800. Stable currently, volume  status improved and weight has trended down.  BP is difficult to obtain but creatinine stable and no lightheadedness.  ?- Volume status stable. Continue torsemide to 40 mg daily.  ?- Off spiro with soft BP ?- Continue Digoxin 0.0625 mg, check level today   ?- Continue asa 81 mg daily. Will need long-term with h/o CVA ?- Continue sildenafil 20 mg tid  ?- Warfarin for VAD. INR pending . Dosing per pharmD.  ?- Continue midodrine 5 mg TID.  ?2. 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  She has been on Levoxyl at home but does not think she has taken for a  month.  Now hyperthyroid.  This likely contributes to her CHF exacerbation. [facility] evaluated her for this. 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 Solumedrol 20 mg IV daily -> transitioned to prednisone 20 on 3/16 and methimazole bid.  ?- per IM, continue prednisone 20 mg x 1 month (4/16), then reduce down to 10 mg until endo follow-up  ?- Will need outpatient endocrinology followup.  ?- Continuing amiodarone for now with NSVT and rapid afib. See below. ?3. PVCs/NSVT: EP consulted given complicated situation with hyperthyroidism and NSVT. They favored continuing amiodarone and quinidine at this time.  Would eventually like to stop amio given hyperthyroidism.  ?- EKG 4/2 in SR no PVCs.  ?4. CVA: CVA in 10/21, had thrombectomy with stent placement.  No LV thrombus noted and no atrial fibrillation noted.  ?- Talked with interventional radiology. >1 year post-stent, no longer on ticagrelor and continue ASA.  ?5. Anemia: Post-op with chest tube output.  3/15, 3/16 1UPRBCs.  ?- Transfuse hgb < 8.  ?- CBC pending  ?6. CKD stage 3: Renal function stable, BMET pending .   ?7. Mitral regurgitation: Moderate-severe functional MR.  Unlikely to be Mitraclip candidate with severely dilated LV (>7 cm). Hopefully will improve with LVAD.  ?8. Left ovarian mass: Cystic left ovarian mass noted on CT abdomen/pelvis,  pelvic US with complex cystic left ovarian mass.  Unable to get MRI with ICD. Seen by GYN. Felt to be most likely non-neoplastic. Tumor markers within normal range. Possible low grade malignancy, could al

## 2021-09-23 NOTE — Progress Notes (Signed)
?   09/23/21 0007  ?Assess: MEWS Score  ?Temp 97.7 ?F (36.5 ?C)  ?BP (!) 78/61  ?Pulse Rate 77  ?Resp 17  ?Level of Consciousness Alert  ?SpO2 99 %  ?O2 Device Room Air  ?Assess: MEWS Score  ?MEWS Temp 0  ?MEWS Systolic 2  ?MEWS Pulse 0  ?MEWS RR 0  ?MEWS LOC 0  ?MEWS Score 2  ?MEWS Score Color Yellow  ?Assess: if the MEWS score is Yellow or Red  ?Were vital signs taken at a resting state? Yes  ?Focused Assessment No change from prior assessment  ?Early Detection of Sepsis Score *See Row Information* Low  ?MEWS guidelines implemented *See Row Information* No, previously yellow, continue vital signs every 4 hours  ?Treat  ?Pain Scale 0-10  ?Pain Score 0  ?Take Vital Signs  ?Increase Vital Sign Frequency  Yellow: Q 2hr X 2 then Q 4hr X 2, if remains yellow, continue Q 4hrs ?(pt is currently on q 4 VS)  ?Escalate  ?MEWS: Escalate Yellow: discuss with charge nurse/RN and consider discussing with provider and RRT  ?Notify: Charge Nurse/RN  ?Name of Charge Nurse/RN Notified [name], RN  ?Date Charge Nurse/RN Notified 09/23/21  ?Time Charge Nurse/RN Notified 0015  ? ? ?

## 2021-09-23 NOTE — IPOC Note (Signed)
Overall Plan of Care (IPOC) ?Patient Details ?Name: STEPHANNIE BRONER ?MRN: [id number] ?DOB: 1970/09/07 ? ?Admitting Diagnosis: Debility ? ?Hospital Problems: Principal Problem: ?  Debility ?Active Problems: ?  LVAD (left ventricular assist device) present ([place]) ?  Hypokalemia ?  Paroxysmal atrial fibrillation (HCC) ?  Transaminitis ? ? ? ? Functional Problem List: ?Nursing Bladder, Medication Management, Safety, Skin Integrity, Bowel, Pain, Endurance, Nutrition  ?PT Balance, Edema, Endurance, Motor, Nutrition, Pain, Safety, Perception  ?OT Balance, Endurance, Motor  ?SLP    ?TR    ?    ? Basic ADL?s: ?OT Grooming, Bathing, Dressing, Toileting  ? ?  Advanced  ADL?s: ?OT Simple Meal Preparation  ?   ?Transfers: ?PT Bed Mobility, Bed to Chair, Car, Furniture  ?OT Toilet  ? ?  Locomotion: ?PT Ambulation, Stairs  ? ?  Additional Impairments: ?OT None  ?SLP   ?  ?   ?TR    ? ? ?Anticipated Outcomes ?Item Anticipated Outcome  ?Self Feeding mod I  ?Swallowing ?   ?  ?Basic self-care ? mod I to S  ?Toileting ? mod I ?  ?Bathroom Transfers S  ?Bowel/Bladder ? manage bowel w mod I and bladder w toileting  ?Transfers ? Mod I  ?Locomotion ? Mod I/ CGA  ?Communication ?    ?Cognition ?    ?Pain ? pain at or below level 4 with prns  ?Safety/Judgment ? maintain safety w cues  ? ?Therapy Plan: ?PT Intensity: Minimum of 1-2 x/day ,45 to 90 minutes ?PT Frequency: 5 out of 7 days ?PT Duration Estimated Length of Stay: 10 days ?OT Intensity: Minimum of 1-2 x/day, 45 to 90 minutes ?OT Frequency: 5 out of 7 days ?OT Duration/Estimated Length of Stay: 7-10 days ?   ? ?Due to the current state of emergency, patients may not be receiving their 3-hours of Medicare-mandated therapy. ? ? Team Interventions: ?Nursing Interventions Bladder Management, Disease Management/Prevention, Medication Management, Discharge Planning, Skin Care/Wound Management, Pain Management, Bowel Management, Patient/Family Education  ?PT interventions Ambulation/gait  training, Community reintegration, DME/adaptive equipment instruction, Neuromuscular re-education, Psychosocial support, Stair training, UE/LE Strength taining/ROM, UE/LE Coordination activities, Therapeutic Activities, Pain management, Skin care/wound management, Functional electrical stimulation, Discharge planning, Balance/vestibular training, Cognitive remediation/compensation, Patient/family education, Disease management/prevention, Functional mobility training, Therapeutic Exercise, Visual/perceptual remediation/compensation  ?OT Interventions Balance/vestibular training, Disease mangement/prevention, Neuromuscular re-education, Self Care/advanced ADL retraining, Therapeutic Exercise, Wheelchair propulsion/positioning, Community reintegration, Patient/family education, UE/LE Coordination activities, Therapeutic Activities, Psychosocial support, Functional mobility training, Discharge planning, DME/adaptive equipment instruction, UE/LE Strength taining/ROM  ?SLP Interventions    ?TR Interventions    ?SW/CM Interventions Discharge Planning, Psychosocial Support, Patient/Family Education  ? ?Barriers to Discharge ?MD  Medical stability  ?Nursing Home environment access/layout, Decreased caregiver support ?2 level, 3ste, bil rails, main B+B w spouse; who plans to take FMLA  ?PT Inaccessible home environment, Decreased caregiver support, Home environment access/layout, Lack of/limited family support, Insurance for SNF coverage, Weight, Nutrition means ?   ?OT Inaccessible home environment, Home environment access/layout, Other (comments) ?sternal precautions  ?SLP   ?   ?SW Insurance for SNF coverage ?   ? ?Team Discharge Planning: ?Destination: PT-Home ,OT- Home , SLP-  ?Projected Follow-up: PT-Home health PT, OT-  Outpatient OT, SLP-None ?Projected Equipment Needs: PT-To be determined, OT- To be determined, SLP-None recommended by SLP ?Equipment Details: PT- , OT-  ?Patient/family involved in discharge planning:  PT- Patient,  OT-Patient, SLP-Patient ? ?MD ELOS: 7-10 days ?Medical Rehab Prognosis:  Excellent ?Assessment: The patient has  been admitted for CIR therapies with the diagnosis of debility related to CM/CHF with ultimate LVAD replacement. The team will be addressing functional mobility, strength, stamina, balance, safety, adaptive techniques and equipment, self-care, bowel and bladder mgt, patient and caregiver education, LVAD education, wound care, nutrition, swallowing, pain control, community reentry. Goals have been set at mod I to supervision for basic moblity and self-care. Anticipated discharge destination is home with family. ? ?Due to the current state of emergency, patients may not be receiving their 3 hours per day of Medicare-mandated therapy.  ? ? ?Meredith Staggers, MD, FAAPMR   ? ? ?See Team Conference Notes for weekly updates to the plan of care  ?

## 2021-09-23 NOTE — Progress Notes (Signed)
Physical Therapy Session Note ? ?Patient Details  ?Name: [name] ?MRN: [id number] ?Date of Birth: Mar 07, 1971 ? ?Today's Date: 09/23/2021 ?PT Individual Time: 0737-1062 and 6948-5462 ?PT Individual Time Calculation (min): 33 min and 74 min ? ?Short Term Goals: ?Week 1:  PT Short Term Goal 1 (Week 1): Pt will perform bed mobility maintaining full sternal precautions with supervision. ?PT Short Term Goal 2 (Week 1): Pt will consistently perform sit <> stand transfers from multiple heights with supervision. ?PT Short Term Goal 3 (Week 1): Pt will consistently perform stand pivot transfers with supervision. ?PT Short Term Goal 4 (Week 1): Pt will ambulate at least 45 ft with no AD and CGA. ?PT Short Term Goal 5 (Week 1): Pt will complete at least 8 steps with BHR and CGA. ? ?Skilled Therapeutic Interventions/Progress Updates:  ?Session 1:  ?Patient seated upright in w/c on entrance to room. Patient alert and agreeable to PT session.  ?  ?LVAD battery pack already set up and pt wearing cross body bag. Extra bag in pt's w/c.  ?Patient with no pain complaint throughout session. ? ?Therapeutic Activity: ?Transfers: Patient performed sit<>stand and stand pivot transfers throughout session with supervision and performed on first attempt each time. Pt provides her own vc for reducing push to rise to stand. ? ?Gait Training:  ?Patient ambulated 100' x2 using RW with supervision. Demonstrated increased lateral sway of hips with minimal UE pressure into RW. Pt then guided in ambulation in day room with no UE support. Pt initially hesitant with decreased step length but is able to build confidence and ambulate with similar quality to ambulation with walker use. Guided in safe mobility in/ out of tight spaces as if in home and demonstrated how walker use will hinder mobility.  ? ?Neuromuscular Re-ed: ?NMR facilitated during session with focus on dynamic gait/ balance and safe decision making with regard to movement precautions. Pt  guided in collection of weighted balls from throughout therapy gym with balls placed from mid shin height to head height. Pt required one vc for improving approach for safety. Able to reach to all heights while maintaining sternal precautions. Reaches to lower heights performed with strong hip/ knee flexion.  ? ?NMR performed for improvements in motor control and coordination, balance, sequencing, judgement, and self confidence/ efficacy in performing all aspects of mobility at highest level of independence.  ? ?Patient seated  in recliner at end of session with brakes locked, seat pad alarm set, and all needs within reach. ? ?Session 2: ?Patient seated in recliner on entrance to room. Patient alert and agreeable to PT session.  ? ?Patient with no pain complaint throughout session.  ? ?Is able to perform change to/ from battery pack for LVAD with no cues. ? ?Therapeutic Activity: ?Transfers: Patient performed sit<>stand and stand pivot transfers throughout session with supervision/ Mod I. Minimal push from seat using RUE. No vc required. ? ?Gait Training:  ?Patient ambulated >280' x1 using RW with close supervision while putting very light pressure into RW. Then ambulated 145' x1/ 135' x1 with no AD and close supervision. Demonstrated no change in quality of gait.  Provided vc/ tc for maintaining self observation throughout in order to self assess level of fatigue and potential need to sit. Education provided re: energy conservation with focus on performing cooking tasks at home on d/c which pt states she will be getting back to.  ? ?Pt guided in stair training with physical demonstration and verbal instructions provided prior to performance.  Pt is able to complete four 6" steps using BHR with CGA to ascend with reciprocal gait pattern and CGA to descend leading with RLE. VC provided at initiation of each direction for leading LE.   ? ?Neuromuscular Re-ed: ?NMR facilitated during session with focus on standing  balance. Pt guided in [place] testing with improvement noted from previous performance a few days prior. NMR performed for improvements in motor control and coordination, balance, sequencing, judgement, and self confidence/ efficacy in performing all aspects of mobility at highest level of independence.  ? ?[place] provided for neck strap of power drive case in order to decrease irritation on neck.  ? ?Patient fatigued at end of session and seated in recliner with BLE elevated at end of session with brakes locked, and all needs within reach. RN gathering nursing students for in-person demonstration/ education re: LVAD. ? 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  ?Vital Signs: ?Therapy Vitals ?Temp: 98 ?F (36.7 ?C) ?Pulse Rate: 81 ?Resp: 18 ?BP: 92/65 ?Patient Position (if appropriate): Lying ?Oxygen Therapy ?SpO2: 100 % ?O2 Device: Room Air ?Pain: ? No pain complaint ? ?Therapy/Group: Individual Therapy ? ?Alger Simons PT, DPT ?09/23/2021, 10:25 AM  ?

## 2021-09-23 NOTE — Progress Notes (Signed)
Inpatient Rehabilitation Center ?Individual Statement of Services ? ?Patient Name:  [name]  ?Date:  09/23/2021 ? ?Welcome to the [facility].  Our goal is to provide you with an individualized program based on your diagnosis and situation, designed to meet your specific needs.  With this comprehensive rehabilitation program, you will be expected to participate in at least 3 hours of rehabilitation therapies Monday-Friday, with modified therapy programming on the weekends. ? ?Your rehabilitation program will include the following services:  Physical Therapy (PT), Occupational Therapy (OT), Speech Therapy (ST), 24 hour per day rehabilitation nursing, Therapeutic Recreaction (TR), Neuropsychology, Care Coordinator, Rehabilitation Medicine, Nutrition Services, and Pharmacy Services ? ?Weekly team conferences will be held on Wednesday to discuss your progress.  Your Inpatient Rehabilitation Care Coordinator will talk with you frequently to get your input and to update you on team discussions.  Team conferences with you and your family in attendance may also be held. ? ?Expected length of stay: 10 days  Overall anticipated outcome: supervision with cueing ? ?Depending on your progress and recovery, your program may change. Your Inpatient Rehabilitation Care Coordinator will coordinate services and will keep you informed of any changes. Your Inpatient Rehabilitation Care Coordinator's name and contact numbers are listed  below. ? ?The following services may also be recommended but are not provided by the [facility]:  ?Driving Evaluations ?Home Health Rehabiltiation Services ?Outpatient Rehabilitation Services ? ?  ?Arrangements will be made to provide these services after discharge if needed.  Arrangements include referral to agencies that provide these services. ? ?Your insurance has been verified to be:  [employer] ?Your primary doctor is:  [name] ? ?Pertinent  information will be shared with your doctor and your insurance company. ? ?Inpatient Rehabilitation Care Coordinator:  Erlene Quan, [place] or ([phone] ? ?Information discussed with and copy given to patient by: Elease Hashimoto, 09/23/2021, 10:07 AM    ?

## 2021-09-23 NOTE — Progress Notes (Signed)
Occupational Therapy Note ? ?Patient Details  ?Name: [name] ?MRN: [id number] ?Date of Birth: April 07, 1971 ? ?Today's Date: 09/23/2021 ?OT Missed Time: 45 Minutes ?Missed Time Reason: Nursing care;Other (comment) (pt requested female only for ADLs (nursing staff assisting)) ? ?Pt missed 45 mins skilled OT services. LPN present administering meds. Pt requested female only for dressing tasks after meds admin. Nursing staff to assist with dressing tasks. Scheduler notified of pt request. Will check back if schedule opens up.   ? ?Leroy Libman ?09/23/2021, 10:03 AM ?

## 2021-09-23 NOTE — Progress Notes (Signed)
Pt was standing up in the room at the sink. LVAD screen suddenly restarted. All monitor settings stayed the same, no alarm sounded. This nurse checked all connections and outlets; everything in place according to protocol.  Due to the pt feeling anxious, LVAD coordinator [place] notified of event. In house cardiology also notified and agreed to come speak with the pt and husband to provide reassurance and answer questions. Charge RN Iona Beard notified  ?Pt resting in bed at this time. Denies pain or discomfort. Call light in reach ?

## 2021-09-23 NOTE — Progress Notes (Signed)
Inpatient Rehabilitation Care Coordinator ?Assessment and Plan ?Patient Details  ?Name: [name] ?MRN: [id number] ?Date of Birth: [birth year] ? ?Today's Date: 09/23/2021 ? ?Hospital Problems: Principal Problem: ?  Debility ?Active Problems: ?  LVAD (left ventricular assist device) present ([place]) ?  Hypokalemia ?  Paroxysmal atrial fibrillation (HCC) ? ?Past Medical History:  ?Past Medical History:  ?Diagnosis Date  ? Automatic implantable cardioverter-defibrillator in situ   ? Chronic CHF (congestive heart failure) (HCC)   ? a. EF 15-20% b. RHC (09/2013) RA 14, RV 57/22, PA 64/36 (48), PCWP 18, FIck CO/CI 3.7/1.6, PVR 8.1 WU, PA sat 47%   ? History of stomach ulcers   ? Hypertension   ? Hypotension   ? Hypothyroidism   ? Morbid obesity ([place])   ? Myocardial infarction [facility]) 08/2013  ? Nocturnal dyspnea   ? Nonischemic cardiomyopathy ([place])   ? Sinus tachycardia   ? Sleep apnea   ? Snoring-prob OSA 09/04/2011  ? Sprint Fidelis ICD lead RECALL  [id number]   ? UARS (upper airway resistance syndrome) 09/04/2011  ? HST 12/2013:  AHI 4/hr (numerous episodes of airflow reduction that did not have concomitant desaturation)   ? ?Past Surgical History:  ?Past Surgical History:  ?Procedure Laterality Date  ? BREATH TEK H PYLORI N/A 11/09/2014  ? Procedure: BREATH TEK H PYLORI;  Surgeon: Greer Pickerel, MD;  Location: [facility];  Service: General;  Laterality: N/A;  ? CARDIAC CATHETERIZATION  ~ 2006; 09/2013  ? CARDIAC CATHETERIZATION N/A 05/23/2016  ? Procedure: Right Heart Cath;  Surgeon: Larey Dresser, MD;  Location: [facility] CV LAB;  Service: Cardiovascular;  Laterality: N/A;  ? CARDIAC DEFIBRILLATOR PLACEMENT  2006; 12/26/2013  ? Medtronic [id number]; 12-2013 ICD gen change and RV lead revision with new 6935 RV lead by Dr [name]  ? CENTRAL LINE INSERTION  08/28/2021  ? Procedure: CENTRAL LINE INSERTION;  Surgeon: Larey Dresser, MD;  Location: [facility] CV LAB;  Service: Cardiovascular;;  ? [place]  ? IMPLANTABLE  CARDIOVERTER DEFIBRILLATOR GENERATOR CHANGE N/A 12/26/2013  ? Procedure: IMPLANTABLE CARDIOVERTER DEFIBRILLATOR GENERATOR CHANGE;  Surgeon: Deboraha Sprang, MD;  Location: [facility] CATH LAB;  Service: Cardiovascular;  Laterality: N/A;  ? INSERTION OF IMPLANTABLE LEFT VENTRICULAR ASSIST DEVICE N/A 09/03/2021  ? Procedure: INSERTION OF IMPLANTABLE LEFT VENTRICULAR ASSIST DEVICE/ Heartmate 3;  Surgeon: Dahlia Byes, MD;  Location: [facility];  Service: Open Heart Surgery;  Laterality: N/A;  ? IR CT HEAD LTD  04/17/2020  ? IR CT HEAD LTD  04/17/2020  ? IR INTRA CRAN STENT  04/17/2020  ? IR PERCUTANEOUS ART THROMBECTOMY/INFUSION INTRACRANIAL INC DIAG ANGIO  04/17/2020  ? IR RADIOLOGIST EVAL & MGMT  06/05/2020  ? LEAD REVISION N/A 12/26/2013  ? Procedure: LEAD REVISION;  Surgeon: Deboraha Sprang, MD;  Location: [facility] CATH LAB;  Service: Cardiovascular;  Laterality: N/A;  ? PLACEMENT OF IMPELLA LEFT VENTRICULAR ASSIST DEVICE Right 08/29/2021  ? Procedure: PLACEMENT OF IMPELLA 5.5 LEFT VENTRICULAR ASSIST DEVICE;  Surgeon: Gaye Pollack, MD;  Location: [facility];  Service: Open Heart Surgery;  Laterality: Right;  ? RADIOLOGY WITH ANESTHESIA N/A 04/17/2020  ? Procedure: Code Stroke;  Surgeon: Luanne Bras, MD;  Location: [facility];  Service: Radiology;  Laterality: N/A;  ? REMOVAL OF IMPELLA LEFT VENTRICULAR ASSIST DEVICE N/A 09/03/2021  ? Procedure: REMOVAL OF IMPELLA LEFT VENTRICULAR ASSIST DEVICE;  Surgeon: Dahlia Byes, MD;  Location: [facility];  Service: Open Heart Surgery;  Laterality: N/A;  ? RIGHT  HEART CATH N/A 08/28/2021  ? Procedure: RIGHT HEART CATH;  Surgeon: Larey Dresser, MD;  Location: [facility] CV LAB;  Service: Cardiovascular;  Laterality: N/A;  ? RIGHT HEART CATHETERIZATION N/A 02/15/2014  ? Procedure: RIGHT HEART CATH;  Surgeon: Larey Dresser, MD;  Location: [facility] CATH LAB;  Service: Cardiovascular;  Laterality: N/A;  ? TEE WITHOUT CARDIOVERSION N/A 08/29/2021  ? Procedure: TRANSESOPHAGEAL ECHOCARDIOGRAM (TEE);  Surgeon: Gaye Pollack, MD;  Location: [facility];  Service: Open Heart Surgery;  Laterality: N/A;  ? TEE WITHOUT CARDIOVERSION N/A 09/03/2021  ? Procedure: TRANSESOPHAGEAL ECHOCARDIOGRAM (TEE);  Surgeon: Dahlia Byes, MD;  Location: [facility];  Service: Open Heart Surgery;  Laterality: N/A;  ? TUBAL LIGATION  1999  ? ?Social History:  reports that she has never smoked. She has never used smokeless tobacco. She reports that she does not drink alcohol and does not use drugs. ? ?Family / Support Systems ?Marital Status: Married ?Patient Roles: Spouse, Parent ?Spouse/Significant Other: Jamie [phone] ?Children: Desiree-daughter [phone] ?Other Supports: Extended family ?Anticipated Caregiver: Husband ?Ability/Limitations of Caregiver: Husband taking FMLA to assist pt with her care ?Caregiver Availability: 24/7 ?Family Dynamics: Close with family who will pull together to assist her at home. Pt is hopeful she will eventually be able to take care of herself and her LVAD in time ? ?Social History ?Preferred language: English ?Religion: None ?Cultural Background: No issues ?Education: HS ?Health Literacy - How often do you need to have someone help you when you read instructions, pamphlets, or other written material from your doctor or pharmacy?: Never ?Writes: Yes ?Employment Status: Disabled ?Legal History/Current Legal Issues: No issues ?Guardian/Conservator: None-accoding to MD pt is capable of making her own decisions while here  ? ?Abuse/Neglect ?Abuse/Neglect Assessment Can Be Completed: Yes ?Physical Abuse: Denies ?Verbal Abuse: Denies ?Sexual Abuse: Denies ?Exploitation of patient/patient's resources: Denies ?Self-Neglect: Denies ? ?Patient response to: ?Social Isolation - How often do you feel lonely or isolated from those around you?: Never ? ?Emotional Status ?Pt's affect, behavior and adjustment status: Pt is motivated to do well and get her NG tube out hopefully today. Husband is present and he is very  supportive and will assist. Pt feels better and hopes to do well here in rehab ?Recent Psychosocial Issues: other health issues ?Psychiatric History: no issues ?Substance Abuse History: None ? ?Patient / Family Perceptions, Expectations & Goals ?Pt/Family understanding of illness & functional limitations: Pt and husband can explain her LVAD and talk with the MD daily and feel they have a good understanding for her treatment plan moving forward. ?Premorbid pt/family roles/activities: wife, mother, daughter, church member, friend, etc ?Anticipated changes in roles/activities/participation: resume ?Pt/family expectations/goals: Pt states: " I hope to get this tube out today I am eating well."  Husband states: " I hope she does well and she is making good progress already I am proud of her." ? ?Community Resources ?Community Agencies: None ?Premorbid Home Care/DME Agencies: None ?Transportation available at discharge: Family ?Is the patient able to respond to transportation needs?: Yes ?In the past 12 months, has lack of transportation kept you from medical appointments or from getting medications?: No ?In the past 12 months, has lack of transportation kept you from meetings, work, or from getting things needed for daily living?: No ?Resource referrals recommended: Neuropsychology ? ?Discharge Planning ?Living Arrangements: Spouse/significant other, Children ?Support Systems: Spouse/significant other, Children, Other relatives, Friends/neighbors, Church/faith community ?Type of Residence: Private residence ?Insurance Resources: [employer], Multimedia [employer] (specify) Nurse, mental health) ?Financial Resources:  Family Support, SSD ?Financial Screen Referred: No ?Living Expenses: Lives with family ?Money Management: Spouse ?Does the patient have any problems obtaining your medications?: No ?Home Management: Family pt helps ?Patient/Family Preliminary Plans: Return home with husband and children. Pt has minor children 13 & 11 in the home.  Husband plans on taking a FMLA to assist with her care at first until she is doing well. ?Care Coordinator Barriers to Discharge: Insurance for SNF coverage ?Care Coordinator Anticipated Follow Up Needs: HH/OP

## 2021-09-23 NOTE — Progress Notes (Addendum)
LVAD Coordinator Rounding Note: ? ?Admitted 09/20/21 to CIR post LVAD implant. e to elevated BMI.  ? ?Pt lying in bed this morning; nurse at bedside administering morning medications. Patient denies complaints, says she is feeling much more confident since transferring to CIR.  ? ?Patient does report she had "a lot of PVCs last night" that she felt. Reviewed by Darrick Grinder, NP and Dr. [name].  ? ?Vital signs: ?Temp: 98 ?HR: 81 ?Doppler Pressure: not documented ?Auto BP:  92/65 (75) ?O2 Sat: 100% on RA  ?Wt:  222.4>218.9 lbs ? ?LVAD interrogation:  ?Speed: 5800 ?Flow: 4.7 ?Power: 4.6w ?PI: 3.2 ?Hct: 31 ? ?Alarms: none ?Events: 2 PI events 09/22/21 ?Hematocrit: 31 ? ?Fixed speed: 5800 ?Low speed limit: 5500 ? ?Drive Line: Existing VAD dressing removed and site care performed using sterile technique. Drive line exit site cleaned with Chlora prep applicators x 2, RINSED WITH SALINE, allowed to dry, and gauze dressing with Silver strip applied. Exit site healing and unincorporated, the velour is fully implanted at exit site. 2 sutures intact. Moderate amount of fat necrosis drainage.  No redness, tenderness, foul odor or rash noted. Drive line anchor secure. Pt will need daily dressing changes using daily kit per VAD coordinator or caregiver, [name]. Next dressing change due: 09/24/21.  ? ? ?Labs:  ?LDH trend:  490>450>373>361 (remains on PO prednisone) ? ?INR trend:  2.4>2.8>3.1>3.1 ? ? ?Anticoagulation Plan: ?-INR Goal: 2.0 - 2.5 ?-ASA Dose: 81 mg  ? ?Device: Medtronic single ICD ?- Therapies: on 200 bpm ?- Pacing:  VVI 40 ?-  Last check 09/18/21 (therapy turned on after surgery/transfer to [facility]) ?        ? ?Plan/Recommendations:  ?1. Page VAD coordinators with VAD equipment and drive line concerns. ?2. Daily drive line dressing change per VAD coordinator or caregiver. Next dressing change due 09/24/21. ? ?Zada Girt RN ?VAD Coordinator  ?Office: [phone]  ?24/7 Pager: [phone]  ? ? ?  ?

## 2021-09-23 NOTE — Progress Notes (Signed)
Inpatient Rehabilitation  Patient information reviewed and entered into eRehab system by Keelie Zemanek M. Hamzeh Tall, M.A., CCC/SLP, PPS Coordinator.  Information including medical coding, functional ability and quality indicators will be reviewed and updated through discharge.    

## 2021-09-23 NOTE — Significant Event (Addendum)
Rapid Response Event Note  ? ?Reason for Call :  ?Called originally at 0135 d/t pt c/o palpitations. RN to obtain VS.  ? ?Pt seen at [facility]. ? ?Initial Focused Assessment:  ?Pt lying in bed with eyes closed. Her breathing is mildly labored.  She awakens easily, is alert and oriented, follows commands, and moves all extremities.  She c/o some SOB and feeling palpitations in her chest that feel like hiccups. She denies chest pain/lightheadedness/dizziness. Lungs clear t/o. Heart-LVAD hum. Skin warm to touch, dry. ? ?T-97.7, HR-78(NSR), BP-67/53(60), RR-18, SpO2-100% on RA. ? ?LVAD:  Speed: 5800, Flow: 4.9, PI: 3.3, Power: 4.6.  ? ?Interventions:  ?CBG-118 ?EKG-NSR ?PCXR ?Plan of Care:  ?Await PCXR results. Continue to monitor pt. Call RRT if further assistance needed.  ? ? ?Event Summary:  ? ?MD Notified: Dr. Letta Pate and Dr. Kalman Shan notified.  ?Call Time:0135 ?Arrival QDUK:3838 ?End Time:0257 ? ?Dillard Essex, RN ?

## 2021-09-23 NOTE — Progress Notes (Signed)
Patient ID: [name], female   DOB: 10/19/1970, 51 y.o.   MRN: [id number] ?Met with the patient to introduce self, review role, rehab process, team conference and plan of care. Reviewed medications and dietary modification recommendations. Patient noted eating well and hopes to have cortrak pulled. Noted sore fingers from CBGs; request to d/c forwarded to [facility]. Reviewed dressing change and LVAD management; reports spouse is doing well with this. Denies pain/discomfort. Continue to follow along to discharge to address educational needs to facilitate preparation for discharge. Dorien Chihuahua B ? ?

## 2021-09-24 ENCOUNTER — Encounter (HOSPITAL_COMMUNITY): Payer: Self-pay | Admitting: Radiology

## 2021-09-24 ENCOUNTER — Encounter (HOSPITAL_COMMUNITY): Payer: Medicare Other | Admitting: Cardiology

## 2021-09-24 ENCOUNTER — Encounter (HOSPITAL_COMMUNITY): Payer: Self-pay | Admitting: Physical Medicine & Rehabilitation

## 2021-09-24 LAB — COMPREHENSIVE METABOLIC PANEL
ALT: 92 U/L — ABNORMAL HIGH (ref 0–44)
AST: 40 U/L (ref 15–41)
Albumin: 2.5 g/dL — ABNORMAL LOW (ref 3.5–5.0)
Alkaline Phosphatase: 202 U/L — ABNORMAL HIGH (ref 38–126)
Anion gap: 7 (ref 5–15)
BUN: 19 mg/dL (ref 6–20)
CO2: 32 mmol/L (ref 22–32)
Calcium: 8.9 mg/dL (ref 8.9–10.3)
Chloride: 104 mmol/L (ref 98–111)
Creatinine, Ser: 0.83 mg/dL (ref 0.44–1.00)
GFR, Estimated: >60 mL/min (ref >60)
Glucose, Bld: 86 mg/dL (ref 70–99)
Potassium: 2.9 mmol/L — ABNORMAL LOW (ref 3.5–5.1)
Sodium: 143 mmol/L (ref 135–145)
Total Bilirubin: 0.5 mg/dL (ref 0.3–1.2)
Total Protein: 5.5 g/dL — ABNORMAL LOW (ref 6.5–8.1)

## 2021-09-24 LAB — CBC
HCT: 31.3 % — ABNORMAL LOW (ref 36.0–46.0)
Hemoglobin: 9 g/dL — ABNORMAL LOW (ref 12.0–15.0)
MCH: 27.7 pg (ref 26.0–34.0)
MCHC: 28.8 g/dL — ABNORMAL LOW (ref 30.0–36.0)
MCV: 96.3 fL (ref 80.0–100.0)
Platelets: 250 10*3/uL (ref 150–400)
RBC: 3.25 MIL/uL — ABNORMAL LOW (ref 3.87–5.11)
RDW: 21 % — ABNORMAL HIGH (ref 11.5–15.5)
WBC: 8.2 10*3/uL (ref 4.0–10.5)
nRBC: 0 % (ref 0.0–0.2)

## 2021-09-24 LAB — MAGNESIUM: Magnesium: 1.9 mg/dL (ref 1.7–2.4)

## 2021-09-24 LAB — PROTIME-INR
INR: 3.2 — ABNORMAL HIGH (ref 0.8–1.2)
Prothrombin Time: 32.7 seconds — ABNORMAL HIGH (ref 11.4–15.2)

## 2021-09-24 LAB — POTASSIUM: Potassium: 4.2 mmol/L (ref 3.5–5.1)

## 2021-09-24 LAB — LACTATE DEHYDROGENASE: LDH: 300 U/L — ABNORMAL HIGH (ref 98–192)

## 2021-09-24 MED ORDER — POTASSIUM CHLORIDE CRYS ER 20 MEQ PO TBCR: 60.0000 meq | EXTENDED_RELEASE_TABLET | Freq: Three times a day (TID) | ORAL | Status: DC

## 2021-09-24 MED ORDER — POTASSIUM CHLORIDE CRYS ER 10 MEQ PO TBCR
60.0000 meq | EXTENDED_RELEASE_TABLET | Freq: Three times a day (TID) | ORAL | Status: DC
  Administered 2021-09-24 – 2021-09-26 (×6): 60 meq via ORAL
  Filled 2021-09-24 (×5): qty 6

## 2021-09-24 MED ORDER — WARFARIN 0.5 MG HALF TABLET
0.5000 mg | ORAL_TABLET | Freq: Once | ORAL | Status: AC
  Administered 2021-09-24: 0.5 mg via ORAL
  Filled 2021-09-24: qty 1

## 2021-09-24 MED ORDER — PROSOURCE PLUS PO LIQD
30.0000 mL | Freq: Two times a day (BID) | ORAL | Status: DC
  Filled 2021-09-24: qty 30

## 2021-09-24 MED ORDER — POTASSIUM CHLORIDE 20 MEQ PO PACK
60.0000 meq | PACK | Freq: Three times a day (TID) | ORAL | Status: DC
  Filled 2021-09-24: qty 3

## 2021-09-24 MED ORDER — MAGNESIUM SULFATE 2 GM/50ML IV SOLN
2.0000 g | Freq: Once | INTRAVENOUS | Status: AC
  Administered 2021-09-24: 2 g via INTRAVENOUS
  Filled 2021-09-24: qty 50

## 2021-09-24 NOTE — Progress Notes (Signed)
Occupational Therapy Session Note ? ?Patient Details  ?Name: [name] ?MRN: [id number] ?Date of Birth: 04/30/1971 ? ?Today's Date: 09/24/2021 ?OT Individual Time: 0177-9390 ?OT Individual Time Calculation (min): 59 min  ? ? ?Short Term Goals: ?Week 1:  OT Short Term Goal 1 (Week 1): STG = LTG 2/2 ELOS ? ?Skilled Therapeutic Interventions/Progress Updates:  ?  Pt received supine with no c/o pain, agreeable to OT session. Pt completed bed mobility with (S) to EOB and then changed over her LVAD to batteries with no assist. Stand pivot transfer to the w/c with (S) overall. Pt was taken outside via w/c for mood support and for d/c discussion. While out here pt completed BUE strengthening circuit with a 3 lb dumbbell, completing exercises that adhered to sternal precautions with min cueing for technique for carryover to ADL and ADL transfers. She had no c/o pain. She was taken back inside and she completed interval style routine on the [facility] for cardiovascular endurance 4x1 min interval with a 1 min rest. Pt returned to her room and was left supine with all needs met. Bed alarm set. Pt on wall power.  ? ? 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 ? ?Therapy/Group: Individual Therapy ? ?Curtis Sites ?09/24/2021, 6:41 AM ?

## 2021-09-24 NOTE — Progress Notes (Addendum)
Patient ID: [name], female   DOB: 01-Jun-1971, 51 y.o.   MRN: [id number] ?  ? ? Advanced Heart Failure Rounding Note ? ?PCP-Cardiologist: [name], MD  ? ?Subjective:   ? ?Progressing well. Feels she is getting stronger w/ PT. No dyspnea. Denies orthopnea. Complains of slight rt Le swelling.  ? ?Appetite is good. Cor trak removed.  ? ?K low this morning, 2.9. Mg level pending.  Per review of MAR, she did not get evening dose of KCl. Will d/w nursing staff.  ? ?Dig level ok, 0.3  ? ?Denies palpitations.  ? ?LVAD Interrogation HM 3: ?Speed: 5800 Flow: 4.9 PI: 3.2 Power: 4.6  ? ? ?Objective:   ?Weight Range: ?98.1 kg ?Body mass index is 38.32 kg/m?.  ? ?Vital Signs:   ?Temp:  [97.6 ?F (36.4 ?C)-98.5 ?F (36.9 ?C)] 98.5 ?F (36.9 ?C) (04/04 7517) ?Pulse Rate:  [78-85] 80 (04/04 0826) ?Resp:  [18-20] 18 (04/04 0826) ?BP: (90-106)/(51-66) 106/66 (04/04 0826) ?SpO2:  [99 %-100 %] 99 % (04/03 1930) ?Weight:  [98.1 kg] 98.1 kg (04/04 0500) ?Last BM Date : 09/23/21 ? ?Weight change: ?Filed Weights  ? 09/20/21 1555 09/22/21 0914 09/24/21 0500  ?Weight: 100.9 kg 99.3 kg 98.1 kg  ? ? ?Intake/Output:  ? ?Intake/Output Summary (Last 24 hours) at 09/24/2021 0847 ?Last data filed at 09/23/2021 1700 ?Gross per 24 hour  ?Intake 415 ml  ?Output --  ?Net 415 ml  ?  ? ? ?Physical Exam  ? ?GENERAL: Well appearing. No acute distress. ?HEENT: + [place] ?NECK: Supple, JVP not elevated  2+ bilaterally, no bruits.  No lymphadenopathy or thyromegaly appreciated.   ?CARDIAC:  Mechanical heart sounds with LVAD hum present.  ?LUNGS:  Clear to auscultation bilaterally. No wheezing  ?ABDOMEN:  Soft, round, nontender, positive bowel sounds x4.     ?LVAD exit site:  Dressing dry and intact.  No erythema or drainage.  Stabilization device present and accurately applied.  Driveline dressing is being changed daily per sterile technique. ?EXTREMITIES:  Warm and dry, no cyanosis, clubbing or rash, trace rt LEE  ?NEUROLOGIC:  Alert and oriented x 3.    No  aphasia.  No dysarthria.  Affect pleasant.    ? ? ?Labs  ?  ?CBC ?Recent Labs  ?  09/23/21 ?0825 09/24/21 ?0440  ?WBC 9.3 8.2  ?NEUTROABS 7.5  --   ?HGB 9.6* 9.0*  ?HCT 32.8* 31.3*  ?MCV 95.6 96.3  ?PLT 259 250  ? ?Basic Metabolic Panel ?Recent Labs  ?  09/23/21 ?0825 09/24/21 ?0440  ?NA 139 143  ?K 3.7 2.9*  ?CL 102 104  ?CO2 31 32  ?GLUCOSE 91 86  ?BUN 28* 19  ?CREATININE 0.84 0.83  ?CALCIUM 8.9 8.9  ? ?Liver Function Tests ?Recent Labs  ?  09/23/21 ?0825 09/24/21 ?0440  ?AST 79* 40  ?ALT 129* 92*  ?ALKPHOS 228* 202*  ?BILITOT 0.4 0.5  ?PROT 5.6* 5.5*  ?ALBUMIN 2.6* 2.5*  ? ?No results for input(s): LIPASE, AMYLASE in the last 72 hours. ?Cardiac Enzymes ?No results for input(s): CKTOTAL, CKMB, CKMBINDEX, TROPONINI in the last 72 hours. ? ?BNP: ?BNP (last 3 results) ?Recent Labs  ?  09/04/21 ?0017 09/10/21 ?4944 09/17/21 ?0140  ?BNP 358.8* 521.3* 279.4*  ? ? ?ProBNP (last 3 results) ?No results for input(s): PROBNP in the last 8760 hours. ? ? ?D-Dimer ?No results for input(s): DDIMER in the last 72 hours. ?Hemoglobin A1C ?No results for input(s): HGBA1C in the last 72 hours. ?Fasting  Lipid Panel ?No results for input(s): CHOL, HDL, LDLCALC, TRIG, CHOLHDL, LDLDIRECT in the last 72 hours. ?Thyroid Function Tests ?No results for input(s): TSH, T4TOTAL, T3FREE, THYROIDAB in the last 72 hours. ? ?Invalid input(s): FREET3 ? ?Other results: ? ? ?Imaging  ? ? ?No results found. ? ? ?Medications:   ? ? ?Scheduled Medications: ? amiodarone  400 mg Oral BID  ? aspirin EC  81 mg Oral Daily  ? Chlorhexidine Gluconate Cloth  6 each Topical Q12H  ? digoxin  0.0625 mg Oral Daily  ? methimazole  10 mg Oral Daily  ? methimazole  5 mg Oral QPM  ? midodrine  5 mg Oral TID WC  ? mirtazapine  7.5 mg Oral QHS  ? pantoprazole  40 mg Oral Daily  ? potassium chloride  60 mEq Oral TID  ? predniSONE  20 mg Oral Q breakfast  ? quiniDINE gluconate  324 mg Oral BID  ? rosuvastatin  40 mg Oral QHS  ? sildenafil  20 mg Oral TID  ? torsemide  40 mg  Oral Daily  ? Warfarin - Pharmacist Dosing Inpatient   Does not apply C1448  ? ? ?Infusions: ? ? ?PRN Medications: ?ALPRAZolam, gabapentin, loperamide HCl, senna-docusate, sodium chloride, sodium chloride flush, traMADol ? ? ?Assessment/Plan  ? ? 1. Acute on chronic systolic HF: Nonischemic cardiomyopathy.  She has a Medtronic ICD. Cause for cardiomyopathy is uncertain, initially identified peri-partum (after son's birth), so cannot rule out peri-partum CMP.  On and off, she has had frequent PVCs.   RHC in 8/15 showed normal filling pressures and preserved cardiac output. Echo in 4/22 with EF 25%, severe LV dilation.  CPX (8/22) with mild-moderate HF limitation.  Echo this admission with EF < 20% with severe dilation, normal RV size with mildly decreased systolic function, moderate-severe functional MR with dilated IVC. She was admitted with volume overload, NYHA class IV symptoms and low output. HCO3 18 and creatinine 1.5 on admit, co-ox 47%. Milrinone 0.25 started + lasix gtt.  I also suspect hyperthyroidism is playing a role in her CHF exacerbation. 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 Impella 5.5 placed 3/9.  Patient had HM3 LVAD placed 3/15. 3/15 Speed increased to 5300 rpm. 3/19 Speed increased to 5400. Ramp echo 3/22, fixed speed increased to 5600.  Ramp echo 3/29, speed increased to 5800. Stable currently, volume status improved and weight has trended down.  BP is difficult to obtain but creatinine stable and no lightheadedness.  ?- Volume status stable. Continue torsemide to 40 mg daily.  ?- Off spiro with soft BP ?- Continue Digoxin 0.0625 mg. Dig level ok  ?- Continue asa 81 mg daily. Will need long-term with h/o CVA ?- Continue sildenafil 20 mg tid  ?- Warfarin for VAD. INR 3.2 . Dosing per pharmD.  ?- Continue midodrine 5 mg TID.  ?2. 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  She has been on Levoxyl at home but does not think she has taken for a month.  Now hyperthyroid.  This likely contributes to her CHF exacerbation. [facility] evaluated her for this. 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 Initially treated w/ IV Solumedrol  -> transitioned to prednisone 20 mg on 3/16 and methimazole bid.  ?- per IM, continue prednisone 20 mg x 1 month (4/16), then reduce down to 10 mg until endo follow-up  ?-  Will need outpatient endocrinology followup.  ?- Continuing amiodarone for now with NSVT and rapid afib. See below. ?3. PVCs/NSVT: EP consulted given complicated situation with hyperthyroidism and NSVT. They favored continuing amiodarone and quinidine at this time.  Would eventually like to stop amio given hyperthyroidism.  ?- EKG 4/2 in SR no PVCs.  ?4. CVA: CVA in 10/21, had thrombectomy with stent placement.  No LV thrombus noted and no atrial fibrillation noted.  ?- Talked with interventional radiology. >1 year post-stent, no longer on ticagrelor and continue ASA.  ?5. Anemia: Post-op with chest tube output.  3/15, 3/16 1UPRBCs.  ?- Transfuse hgb < 8.  ?- Hgb stable 9.0 today ?6. CKD stage 3: Renal function stable ?7. Mitral regurgitation: Moderate-severe functional MR.  Unlikely to be Mitraclip candidate with severely dilated LV (>7 cm). Hopefully will improve with LVAD.  ?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  Unable to get MRI with ICD. Seen by GYN. Felt to be most likely non-neoplastic. Tumor markers within normal range. Possible low grade malignancy, could also be benign.  No ascites noted on abdominal CT and no mention of abdominal spread.  She is not a surgical candidate at this time with low output HF.  ?- Plan at this point would be to stabilize with LVAD then investigate the ovarian mass.  ?- F/u with GYN as outpatient ?9. Elevated WBCs: Thought to be  due to prednisone. Now resolved, WBC 8.2  ?10. Malnutrition: Appetite improving.  ?- Remeron added to stimulate appetite  ?- CorTrak removed  ?11. VT:  Paced out on 3/16.   ?- Continue amiodarone and

## 2021-09-24 NOTE — Progress Notes (Signed)
Nutrition Follow-up ? ?DOCUMENTATION CODES:  ? ?Non-severe (moderate) malnutrition in context of chronic illness, Obesity unspecified ? ?INTERVENTION:  ?Provide 30 ml Prosource plus po BID, each supplement provides 100 kcal and 15 grams of protein.  ? ?Encourage adequate PO intake.  ? ?NUTRITION DIAGNOSIS:  ? ?Moderate Malnutrition related to chronic illness (CHF) as evidenced by mild fat depletion, moderate muscle depletion; ongoing ? ?GOAL:  ? ?Patient will meet greater than or equal to 90% of their needs; progressing ? ?MONITOR:  ? ?PO intake, Supplement acceptance, Labs, Weight trends, Skin, I & O's ? ?REASON FOR ASSESSMENT:  ? ?Consult ?Enteral/tube feeding initiation and management ? ?ASSESSMENT:  ? ?51 year old female who was admitted to [facility] on 3/31. PMH of HTN, hypothyroidism, nonischemic cardiomyopathy with EF 20-25% s/p AICD, severe MR. 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 in October 2021, CKD stage III, CHF. During acute admission, pt underwent LVAD placement on 09/03/21. Pt had Cortrak placed due to poor PO intake and has been transitioned from continuous tube feeds to nocturnal tube feeds. ? ?Cortrak NGT removed yesterday. Tube feeding orders discontinued. Meal completion has been 80-100%. Appetite and intake has improved. Pt educated on the importance of protein intake at meals. Pt reports understanding. RD to order Prosource to aid in protein needs as pt reports dislike of other protein supplementation. Pt encouraged to eat her food at meals.  ? ?NUTRITION - FOCUSED PHYSICAL EXAM: ? ?Flowsheet Row Most Recent Value  ?Orbital Region Mild depletion  ?Upper Arm Region No depletion  ?Thoracic and Lumbar Region No depletion  ?Buccal Region Moderate depletion  ?Temple Region Moderate depletion  ?Clavicle Bone Region Mild depletion  ?Clavicle and Acromion Bone Region Mild depletion  ?Scapular Bone Region Unable to assess  ?Dorsal Hand Unable to assess  ?Patellar Region Moderate depletion  ?Anterior Thigh  Region Moderate depletion  ?Posterior Calf Region Moderate depletion  ?Edema (RD Assessment) Mild  ?Hair Reviewed  ?Eyes Reviewed  ?Mouth Reviewed  ?Skin Reviewed  ?Nails Unable to assess  ? ?  ? ?Labs and medications reviewed.  ? ?Diet Order:   ?Diet Order   ? ?       ?  Diet regular Room service appropriate? Yes; Fluid consistency: Thin  Diet effective now       ?  ? ?  ?  ? ?  ? ? ?EDUCATION NEEDS:  ? ?Education needs have been addressed ? ?Skin:  Skin Assessment: Reviewed RN Assessment ?Skin Integrity Issues:: Incisions, Other (Comment) ?Incisions: closed incision to chest, abdomen ?Other: skin tear abdomen, new LVAD/driveline ? ?Last BM:  4/4 ? ?Height:  ? ?Ht Readings from Last 1 Encounters:  ?09/20/21 '5\' 3"'$  (1.6 m)  ? ? ?Weight:  ? ?Wt Readings from Last 1 Encounters:  ?09/24/21 98.1 kg  ? ?BMI:  Body mass index is 38.32 kg/m?. ? ?Estimated Nutritional Needs:  ? ?Kcal:  1900-2100 ? ?Protein:  100-120 grams ? ?Fluid:  2.0 L ? ?Corrin Parker, MS, RD, LDN ?RD pager number/after hours weekend pager number on Amion. ? ?

## 2021-09-24 NOTE — Progress Notes (Signed)
Occupational Therapy Session Note ? ?Patient Details  ?Name: [name] ?MRN: [id number] ?Date of Birth: September 24, 1970 ? ?Today's Date: 09/24/2021 ?OT Individual Time: 9449-6759 ?OT Individual Time Calculation (min): 45 min  ? ? ?Short Term Goals: ?Week 1:  OT Short Term Goal 1 (Week 1): STG = LTG 2/2 ELOS ? ?Skilled Therapeutic Interventions/Progress Updates:  ?  Pt received seated EOB ready for therapy , attached to mobile LVAD unit, agreeable to therapy, no c/o pain. Session focus on self-care retraining, activity tolerance, dynamic standing balance, transfer retraining, energy conservation education in prep for improved ADL/IADL/func mobility performance + decreased caregiver burden. Ambulated > hospital atrium with RW and distant S due to increased distance. Pt located 5/5 shopping list items in store within reasonable amount of time, no AD and close S due to increasing fatigue and reliance on furniture walking towards end. Reports 5/10 on modified RPE and required extended rest break. Weaved in/out tables/chairs busy hospital environment, able to complete 3 sit to stands from low, soft surfaces with only min A from couch due to fatigue. W/c transport back to unit due to fatigue. ? ?Discussed kitchen navigation and energy conservation techniques (placing frequently used items on counter, having chair/table to meal prep, etc.) Pt reports daughter already purchasing cart for meal prep. Issued LH sponge to adhere to sternal precautions safety during sponge baths.  ? ?Pt ind hooked her controller back to wall unit. ? ?Pt left semi-reclined in bed with call [place] in reach, and all immediate needs met.  ? ? 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 ?Pain: ? denies ?ADL: See Care Tool for more details. ? ?Therapy/Group: Individual Therapy ? ?Volanda Napoleon MS, OTR/L ? ?09/24/2021, 6:51 AM ?

## 2021-09-24 NOTE — Progress Notes (Signed)
Physical Therapy Session Note ? ?Patient Details  ?Name: [name] ?MRN: [id number] ?Date of Birth: [birth year] ? ?Today's Date: 09/24/2021 ?PT Individual Time: 0859-1000 and 1530-1630 ?PT Individual Time Calculation (min): 61 min and 60 min ? ?Short Term Goals: ?Week 1:  PT Short Term Goal 1 (Week 1): Pt will perform bed mobility maintaining full sternal precautions with supervision. ?PT Short Term Goal 2 (Week 1): Pt will consistently perform sit <> stand transfers from multiple heights with supervision. ?PT Short Term Goal 3 (Week 1): Pt will consistently perform stand pivot transfers with supervision. ?PT Short Term Goal 4 (Week 1): Pt will ambulate at least 45 ft with no AD and CGA. ?PT Short Term Goal 5 (Week 1): Pt will complete at least 8 steps with BHR and CGA. ? ?Skilled Therapeutic Interventions/Progress Updates:  ?Session 1: ?Patient seated on EOB on entrance to room. Patient alert and agreeable to PT session. Completes dressing with setup. Requires ModA with R shoes d/t swelling in R foot.  ? ?Patient with no pain complaint throughout session. ? ?Therapeutic Activity: ?Transfers: Patient performed sit<>stand and stand pivot transfers throughout session with supervision/ mod I. Vc required for reminder to ensure locked brakes on w/c prior to all transfers. ? ?Gait Training:  ?Patient ambulated 40' x1/ 42' x1/ 17' x1 in hallway to [facility] in order to expose pt to sunlight for improved mood. Ambulates using no AD with close supervision. Demonstrated slow but consistent pace. Provided vc/ tc for energy conservation and rest prior to absolute need.  ? ?Pt guided in stair training with physical demonstration and verbal instructions provided prior to performance. Pt is able to complete eight 6" steps using BHR with close supervision to ascend leading with LLE and supervision to descend leading with RLE. VC provided at initiation of each direction for leading LE.  Completes 8 steps prior to "feeling winded"  and requiring rest break.  ? ?Neuromuscular Re-ed: ?NMR facilitated during session with focus on standing balance. Pt guided in BITS training while standing on Airex pad. Completes rotational complex array with alphabet sequence. First round with 81.25% accuracy, completed in 44mn 2 sec with reaction time of 2.38sec. Improves to 100% accuracy in 54 sec with 2.06 sec reaction time. Completes with RUE only. NMR performed for improvements in motor control and coordination, balance, sequencing, judgement, and self confidence/ efficacy in performing all aspects of mobility at highest level of independence.  ? ?LVAD coordinator in room prior to end of session. Requested education for pt on unit that pt will be going home with. Session set up for THU.  ? ?Patient seated on EOB at end of session with brakes locked,  no alarm set, and all needs within reach. LVAD coordinator present to assess and change dressing at drive site.  ? ?Session 2: ?Patient seated upright on EOB and completing battery change out as well as dressing with RN present. Patient alert and agreeable to PT session.  ? ?Patient with no pain complaint throughout session. ? ?Therapeutic Activity: ?Transfers: Patient performed sit<>stand and stand pivot transfers throughout session with supervision. Provided verbal cues for locking brakes on w/c. ? ?Gait Training:  ?Patient ambulated outside over 120 ft using no AD with close supervision. Pt also completes slow ambulation transitioning from uneven surface outside over threshold and throughout 1st floor atrium into elevator and most distance back toward room. Distance >4042f Provided vc/ tc for continued self assessment of fatigue. ? ?Patient seated on EOB at end of session  with brakes locked, no alarm set, and all needs within reach. NT in room for CHG bathing.  ? ? ?Therapy Documentation ?Precautions:  ?Precautions ?Precautions: Fall, Sternal ?Precaution Comments: recent LVAD, cortrack, hx of L MCA CVA with  residual R sided weakness ?Restrictions ?Weight Bearing Restrictions: No ?General: ?  ?Vital Signs: ?Therapy Vitals ?Temp: 98.5 ?F (36.9 ?C) ?Temp Source: Oral ?Pulse Rate: 80 ?Resp: 18 ?BP: 106/66 ?Patient Position (if appropriate): Lying ?Pain: ?Pain Assessment ?Pain Scale: 0-10 ?Pain Score: 0-No pain ? ?Therapy/Group: Individual Therapy ? ?Alger Simons PT, DPT ?09/24/2021, 10:28 AM  ?

## 2021-09-24 NOTE — Progress Notes (Signed)
LVAD Coordinator Rounding Note: ? ?Admitted 09/20/21 to CIR post LVAD implant. e to elevated BMI.  ? ?Patient in w/c returning from PT exercise. Pt dressed in regular clothes, reports she feels "good today, denies any issues. ? ?Pt got from w/c to bed and from bed to bathroom without any assistance. Says she feels stronger and less anxious today. Spoke with PT about possible discharge home this Thurs or Fri. She says she and her husband will "be ready".  VAD home equipment will be delivered to room prior to discharge.  ? ?Vital signs: ?Temp: 98 ?HR: 79 ?Doppler Pressure: not documented ?Auto BP:  106/66 ?O2 Sat: 100% on RA  ?Wt:  222.4>218.9>216.3 lbs ? ?LVAD interrogation:  ?Speed: 5800 ?Flow: 4.9 ?Power: 4.7w ?PI: 2.5 ?Hct: 31 ? ?Alarms: none ?Events: none ?Hematocrit: 31 ? ?Fixed speed: 5800 ?Low speed limit: 5500 ? ?Drive Line: Existing VAD dressing removed and site care performed using sterile technique. Drive line exit site cleaned with Chlora prep applicators x 2, RINSED WITH SALINE, allowed to dry, and gauze dressing with Silver strip applied. Exit site healing and unincorporated, the velour is fully implanted at exit site. 2 sutures intact. Moderate amount of serous drainage noted.  No redness, tenderness, foul odor or rash noted. Drive line anchor secure. Pt will need daily dressing changes using daily kit per VAD coordinator or caregiver, [name]. Next dressing change due: 09/25/21.  ? ? ? ?Labs:  ?LDH trend:  490>450>373>361>300  ? ?INR trend:  2.4>2.8>3.1>3.1>3.2 ? ? ?Anticoagulation Plan: ?-INR Goal: 2.0 - 2.5 ?-ASA Dose: 81 mg  ? ?Device: Medtronic single ICD ?- Therapies: on 200 bpm ?- Pacing:  VVI 40 ?-  Last check 09/18/21 (therapy turned on after surgery/transfer to [facility]) ?        ? ?Plan/Recommendations:  ?1. Page VAD coordinators with VAD equipment and drive line concerns. ?2. Daily drive line dressing change per VAD coordinator or caregiver. Next dressing change due 09/25/21. ? ?Zada Girt RN ?VAD  Coordinator  ?Office: [phone]  ?24/7 Pager: [phone]  ? ? ?  ?

## 2021-09-24 NOTE — Progress Notes (Signed)
ANTICOAGULATION CONSULT NOTE - Follow Up Consult ? ?Pharmacy Consult for Warfarin ?Indication: LVAD HM3 ? ?No Known Allergies ? ?Patient Measurements: ?Height: '5\' 3"'$  (160 cm) ?Weight: 98.1 kg (216 lb 4.8 oz) ?IBW/kg (Calculated) : 52.4 ? ?Vital Signs: ?Temp: 98.5 ?F (36.9 ?C) (04/04 7342) ?Temp Source: Oral (04/04 8768) ?BP: 106/66 (04/04 0826) ?Pulse Rate: 80 (04/04 0826) ? ?Labs: ?Recent Labs  ?  09/22/21 ?0601 09/23/21 ?0825 09/24/21 ?0440  ?HGB 9.6* 9.6* 9.0*  ?HCT 33.0* 32.8* 31.3*  ?PLT 260 259 250  ?LABPROT 31.9* 32.2* 32.7*  ?INR 3.1* 3.1* 3.2*  ?CREATININE 0.92 0.84 0.83  ? ? ? ?Estimated Creatinine Clearance: 90.5 mL/min (by C-G formula based on SCr of 0.83 mg/dL). ? ? ?Medications:  ?Scheduled:  ? amiodarone  400 mg Oral BID  ? aspirin EC  81 mg Oral Daily  ? Chlorhexidine Gluconate Cloth  6 each Topical Q12H  ? digoxin  0.0625 mg Oral Daily  ? methimazole  10 mg Oral Daily  ? methimazole  5 mg Oral QPM  ? midodrine  5 mg Oral TID WC  ? mirtazapine  7.5 mg Oral QHS  ? pantoprazole  40 mg Oral Daily  ? potassium chloride  60 mEq Oral TID  ? predniSONE  20 mg Oral Q breakfast  ? quiniDINE gluconate  324 mg Oral BID  ? rosuvastatin  40 mg Oral QHS  ? sildenafil  20 mg Oral TID  ? torsemide  40 mg Oral Daily  ? Warfarin - Pharmacist Dosing Inpatient   Does not apply T1572  ? ? ?Assessment: ?51 yo F who is s/p HM3 LVAD placement on 3/14. Patient has been started on warfarin and aspirin. Aspirin '81mg'$  will be continued indefinitely given prior CVA.  ? ?INR is still slightly above goal at 3.2, pt reporting great appetite. LDH and CBC stable. ? ?Goal of Therapy:  ?INR 2-2.5 ?Monitor platelets by anticoagulation protocol: Yes ?  ?Plan:  ?Warfarin 0.5 mg x1 again today. ?Daily INR, CBC.  ?Monitor for signs/symptoms of bleeding. ? ? ?Arrie Senate, PharmD, BCPS, BCCP ?Clinical Pharmacist ?[phone] ?Please check AMION for all [place] numbers ?09/24/2021 ? ?

## 2021-09-24 NOTE — Progress Notes (Signed)
?                                                       PROGRESS NOTE ? ? ?Subjective/Complaints: ? ?Seen during therapy.  Discussed K+ , pt visiting with CHF RN navigator  ? ?ROS: Patient denies CP, SOB, N/V/D ? ? ?Objective: ?  ?DG Chest Port 1 View ? ?Result Date: 09/23/2021 ?CLINICAL DATA:  Acute respiratory distress EXAM: PORTABLE CHEST 1 VIEW COMPARISON:  09/18/2021 FINDINGS: Postoperative changes in the mediastinum. Cardiac pacemaker. Left ventricular assist device. Enteric tube. Appliances are unchanged since prior study. Shallow inspiration. Infiltration or atelectasis in the right lower lung is unchanged. No pleural effusions. No pneumothorax. Diffuse enlargement of the cardiac silhouette. Surgical clips in the right axilla. IMPRESSION: No change since prior study. Persistent enlargement of the cardiac silhouette. Persistent infiltration or atelectasis in the right lung base. Electronically Signed   By: Lucienne Capers M.D.   On: 09/23/2021 03:03   ?Recent Labs  ?  09/23/21 ?0825 09/24/21 ?0440  ?WBC 9.3 8.2  ?HGB 9.6* 9.0*  ?HCT 32.8* 31.3*  ?PLT 259 250  ? ? ?Recent Labs  ?  09/23/21 ?0825 09/24/21 ?0440  ?NA 139 143  ?K 3.7 2.9*  ?CL 102 104  ?CO2 31 32  ?GLUCOSE 91 86  ?BUN 28* 19  ?CREATININE 0.84 0.83  ?CALCIUM 8.9 8.9  ? ? ? ?Intake/Output Summary (Last 24 hours) at 09/24/2021 0946 ?Last data filed at 09/24/2021 0700 ?Gross per 24 hour  ?Intake 415 ml  ?Output --  ?Net 415 ml  ? ?  ? ?  ? ?Physical Exam: ?Vital Signs ?Blood pressure 106/66, pulse 80, temperature 98.5 ?F (36.9 ?C), temperature source Oral, resp. rate 18, height '5\' 3"'$  (1.6 m), weight 98.1 kg, SpO2 99 %. ? ? ?General: No acute distress ?Mood and affect are appropriate ?Heart: Regular rate and rhythm no rubs murmurs or extra sounds ?Lungs: Clear to auscultation, breathing unlabored, no rales or wheezes ?Abdomen: Positive bowel sounds, soft nontender to palpation, nondistended ?Extremities: No clubbing, cyanosis, or edema ?Skin: No evidence  of breakdown, no evidence of rash ?Neurologic: Cranial nerves II through XII intact, motor strength is 4/5 in bilateral deltoid, bicep, tricep, grip, hip flexor, knee extensors, ankle dorsiflexor and plantar flexor ?Sensory exam normal for light touch and pain in all 4 limbs. No limb ataxia or cerebellar signs. No abnormal tone appreciated.   ?Musculoskeletal: Full range of motion in all 4 extremities. No joint swelling ? ?Skin epidermis open on drain wound x 2 , no drainage or erythema ? ?Assessment/Plan: ?1. Functional deficits which require 3+ hours per day of interdisciplinary therapy in a comprehensive inpatient rehab setting. ?Physiatrist is providing close team supervision and 24 hour management of active medical problems listed below. ?Physiatrist and rehab team continue to assess barriers to discharge/monitor patient progress toward functional and medical goals ? ?Care Tool: ? ?Bathing ?   ?Body parts bathed by patient: Face, Right arm, Left arm, Chest, Abdomen, Right upper leg, Left upper leg, Right lower leg, Left lower leg  ?   ?  ?  ?Bathing assist Assist Level: Minimal Assistance - Patient > 75% ?  ?  ?Upper Body Dressing/Undressing ?Upper body dressing   ?What is the patient wearing?: Button up shirt ?   ?Upper body assist Assist  Level: Minimal Assistance - Patient > 75% ?   ?Lower Body Dressing/Undressing ?Lower body dressing ? ? ?   ?What is the patient wearing?: Incontinence brief, Pants ? ?  ? ?Lower body assist Assist for lower body dressing: Minimal Assistance - Patient > 75% ?   ? ?Toileting ?Toileting Toileting Activity did not occur (Probation officer and hygiene only): N/A (no void or bm)  ?Toileting assist Assist for toileting: Maximal Assistance - Patient 25 - 49% ?  ?  ?Transfers ?Chair/bed transfer ? ?Transfers assist ?   ? ?Chair/bed transfer assist level: Minimal Assistance - Patient > 75% ?  ?  ?Locomotion ?Ambulation ? ? ?Ambulation assist ? ?   ? ?Assist level: Contact  Guard/Touching assist ?Assistive device: Walker-rolling ?Max distance: 175 ft  ? ?Walk 10 feet activity ? ? ?Assist ?   ? ?Assist level: Contact Guard/Touching assist ?Assistive device: Walker-rolling  ? ?Walk 50 feet activity ? ? ?Assist   ? ?Assist level: Contact Guard/Touching assist ?Assistive device: Walker-rolling  ? ? ?Walk 150 feet activity ? ? ?Assist   ? ?Assist level: Contact Guard/Touching assist ?Assistive device: Walker-rolling ?  ? ?Walk 10 feet on uneven surface  ?activity ? ? ?Assist Walk 10 feet on uneven surfaces activity did not occur: Safety/medical concerns ? ? ?  ?   ? ?Wheelchair ? ? ? ? ?Assist Is the patient using a wheelchair?: No ?Type of Wheelchair: Manual ?Wheelchair activity did not occur: Safety/medical concerns ? ?  ?   ? ? ?Wheelchair 50 feet with 2 turns activity ? ? ? ?Assist ? ?  ?Wheelchair 50 feet with 2 turns activity did not occur: Safety/medical concerns ? ? ?   ? ?Wheelchair 150 feet activity  ? ? ? ?Assist ? Wheelchair 150 feet activity did not occur: Safety/medical concerns ? ? ?   ? ?Blood pressure 106/66, pulse 80, temperature 98.5 ?F (36.9 ?C), temperature source Oral, resp. rate 18, height '5\' 3"'$  (1.6 m), weight 98.1 kg, SpO2 99 %. ? ?Medical Problem List and Plan: ?1. Functional deficits secondary to debility/acute chronic systolic congestive heart failure/nonischemic cardiomyopathy with ICD status post LVAD 3/15. ?            -patient may not shower ?            -ELOS/Goals: 7-10 days, min assist/sup with PT,OT and mod I SLP ?2.  Antithrombotics: ?-DVT/anticoagulation:  Pharmaceutical: Coumadin ?            -antiplatelet therapy: Aspirin 81 mg daily ?3. Pain Management: Oxycodone as needed Neurontin 100 mg 3 times daily as needed ?4. Mood: Remeron 7.5 mg nightly, anxiety 0.25 mg 3 times daily as needed ?            -antipsychotic agents: N/A ?5. Neuropsych: This patient is capable of making decisions on her own behalf. ?6. Skin/Wound Care: Routine skin checks ?             -continue q-shift dressing changes to right shoulder hematoma ?7. Fluids/Electrolytes/Nutrition: Routine in and outs with follow-up chemistries during admit ?8.  Acute blood loss anemia.  Follow-up CBC ?9.  Hypotension.  ProAmatine 5 mg 3 times daily ?10.  Atrial fibrillation.  Continue amiodarone 400 mg twice daily per cardiology services as well as Lanoxin 0.0625 mg daily ? 4/3 had palpitations last night. Were self-limited. observe ?11.  CKD stage III.  Follow-up chemistries ? ?  Latest Ref Rng & Units 09/24/2021  ?  4:40 AM 09/23/2021  ?  8:25 AM 09/22/2021  ?  8:11 PM  ?BMP  ?Glucose 70 - 99 mg/dL 86   91     ?BUN 6 - 20 mg/dL 19   28     ?Creatinine 0.44 - 1.00 mg/dL 0.83   0.84     ?Sodium 135 - 145 mmol/L 143   139     ?Potassium 3.5 - 5.1 mmol/L 2.9   3.7   3.5    ?Chloride 98 - 111 mmol/L 104   102     ?CO2 22 - 32 mmol/L 32   31     ?Calcium 8.9 - 10.3 mg/dL 8.9   8.9     ? Low again 4/4 , on high dose K+- just increased  , repeat later, ask for cardiology input on this- only received 1 dose of po KCL yesterday  ?12.  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  Continue aspirin therapy ?13.  Morbid obesity.  BMI 39.17.  Dietary follow-up ?            -pt aware of change needed in diet ?14.  Hyperthyroidism.  Continue Tapazole.  Patient initially hypothyroidism but has been not taking her medication for 1 month prior to admission.  Currently maintained on steroid therapy for hyperthyroid condition.  Per internal medicine continue prednisone 20 mg daily x1 month then reduce to 10 mg until endocrinology follow-up as outpatient ?15.  Hyperlipidemia.  Crestor ?16.  Left ovarian mass.  Noted on CT abdomen pelvis.  Unable to get MRI due to ICD.  Tumor markers within normal limits discussed with GYN.  Follow-up outpatient ?17.  Decreased nutritional storage.  Currently on a regular diet.              ?  ? 18.  HypoK likely related to diuretic use TOrsemide reduced per Cardiology 4/2    ? -continue diuretics/k+ supp per cards recs ?19. Transaminitis: ? -pt hasn't had these checked since 3/16-17 when there was mild elevation ? -fairly large jump today in AP, AST, ALT ? -could be reactive, could also

## 2021-09-24 NOTE — Progress Notes (Signed)
This chaplain is present for F/U spiritual care as the Pt. participates in inpatient rehab.  The Pt. is between therapy sessions during the chaplain visit.  ? ?The Pt. is sitting beside the window in the sunlight. The chaplain listened reflectively as the Pt. described the different places of "light in her life" with the LVAD. The chaplain understands the Pt. remains hopeful in the process. ? ?This chaplain will continue a spiritual care relationship with the Pt. and family as needed. The chaplain and Pt. prayed together at the end of the visit. The Pt. provided LVAD battery education to the chaplain. ? ?Chaplain Sallyanne Kuster ?[phone] ? ?

## 2021-09-24 NOTE — Plan of Care (Signed)
?  Problem: Consults ?Goal: RH GENERAL PATIENT EDUCATION ?Description: See Patient Education module for education specifics. ?Outcome: Progressing ?  ?Problem: RH BOWEL ELIMINATION ?Goal: RH STG MANAGE BOWEL WITH ASSISTANCE ?Description: STG Manage Bowel with mod I  Assistance. ?Outcome: Progressing ?Goal: RH STG MANAGE BOWEL W/MEDICATION W/ASSISTANCE ?Description: STG Manage Bowel with Medication with mod I Assistance. ?Outcome: Progressing ?  ?Problem: RH BLADDER ELIMINATION ?Goal: RH STG MANAGE BLADDER WITH ASSISTANCE ?Description: STG Manage Bladder With toileting Assistance ?Outcome: Progressing ?  ?Problem: RH SAFETY ?Goal: RH STG ADHERE TO SAFETY PRECAUTIONS W/ASSISTANCE/DEVICE ?Description: STG Adhere to Safety Precautions With cues Assistance/Device. ?Outcome: Progressing ?  ?Problem: RH PAIN MANAGEMENT ?Goal: RH STG PAIN MANAGED AT OR BELOW PT'S PAIN GOAL ?Description: At or below level 4 with prns ?Outcome: Progressing ?  ?Problem: RH KNOWLEDGE DEFICIT GENERAL ?Goal: RH STG INCREASE KNOWLEDGE OF SELF CARE AFTER HOSPITALIZATION ?Description: Patient and spouse will be able to manage care at discharge using handouts and educational resources independently ?Outcome: Progressing ?  ?Problem: Education: ?Goal: Knowledge of the prescribed therapeutic regimen will improve ?Description: Patient and spouse will be able to manage care , medications, skin care, and dietary modifications at discharge using handouts and educational resources independently ?Outcome: Progressing ?  ?Problem: Activity: ?Goal: Risk for activity intolerance will decrease ?Description: Patient and spouse will be able to manage care , medications, skin care, and dietary modifications at discharge using handouts and educational resources independently ?Outcome: Progressing ?  ?Problem: Cardiac: ?Goal: Ability to maintain an adequate cardiac output will improve ?Description: Patient and spouse will be able to manage care , medications, skin  care, and dietary modifications at discharge using handouts and educational resources independently ?Outcome: Progressing ?  ?Problem: Fluid Volume: ?Goal: Risk for excess fluid volume will decrease ?Description: Patient and spouse will be able to manage care , medications,and dietary modifications at discharge using handouts and educational resources independently ?Outcome: Progressing ?  ?Problem: Respiratory: ?Goal: Will regain and/or maintain adequate ventilation ?Outcome: Progressing ?  ?

## 2021-09-25 ENCOUNTER — Other Ambulatory Visit (HOSPITAL_COMMUNITY): Payer: Self-pay

## 2021-09-25 LAB — CBC
HCT: 32.5 % — ABNORMAL LOW (ref 36.0–46.0)
Hemoglobin: 9.3 g/dL — ABNORMAL LOW (ref 12.0–15.0)
MCH: 27.5 pg (ref 26.0–34.0)
MCHC: 28.6 g/dL — ABNORMAL LOW (ref 30.0–36.0)
MCV: 96.2 fL (ref 80.0–100.0)
Platelets: 266 10*3/uL (ref 150–400)
RBC: 3.38 MIL/uL — ABNORMAL LOW (ref 3.87–5.11)
RDW: 21 % — ABNORMAL HIGH (ref 11.5–15.5)
WBC: 9 10*3/uL (ref 4.0–10.5)
nRBC: 0 % (ref 0.0–0.2)

## 2021-09-25 LAB — BASIC METABOLIC PANEL
Anion gap: 6 (ref 5–15)
BUN: 17 mg/dL (ref 6–20)
CO2: 29 mmol/L (ref 22–32)
Calcium: 9.1 mg/dL (ref 8.9–10.3)
Chloride: 104 mmol/L (ref 98–111)
Creatinine, Ser: 0.88 mg/dL (ref 0.44–1.00)
GFR, Estimated: >60 mL/min (ref >60)
Glucose, Bld: 80 mg/dL (ref 70–99)
Potassium: 4.1 mmol/L (ref 3.5–5.1)
Sodium: 139 mmol/L (ref 135–145)

## 2021-09-25 LAB — PROTIME-INR
INR: 3.2 — ABNORMAL HIGH (ref 0.8–1.2)
Prothrombin Time: 32.2 seconds — ABNORMAL HIGH (ref 11.4–15.2)

## 2021-09-25 LAB — LACTATE DEHYDROGENASE: LDH: 313 U/L — ABNORMAL HIGH (ref 98–192)

## 2021-09-25 MED ORDER — ROSUVASTATIN CALCIUM 40 MG PO TABS
40.0000 mg | ORAL_TABLET | Freq: Every day | ORAL | 0 refills | Status: DC
  Filled 2021-09-25: qty 30, 30d supply, fill #0

## 2021-09-25 MED ORDER — TORSEMIDE 20 MG PO TABS
20.0000 mg | ORAL_TABLET | Freq: Every day | ORAL | Status: DC
  Administered 2021-09-26: 20 mg via ORAL
  Filled 2021-09-25: qty 1

## 2021-09-25 MED ORDER — DIGOXIN 125 MCG PO TABS
0.0625 mg | ORAL_TABLET | Freq: Every day | ORAL | 0 refills | Status: DC
  Filled 2021-09-25: qty 30, 60d supply, fill #0

## 2021-09-25 MED ORDER — QUINIDINE GLUCONATE ER 324 MG PO TBCR
324.0000 mg | EXTENDED_RELEASE_TABLET | Freq: Two times a day (BID) | ORAL | 0 refills | Status: DC
Start: 2021-09-25 — End: 2022-03-27
  Filled 2021-09-25: qty 60, 30d supply, fill #0

## 2021-09-25 MED ORDER — TRAMADOL HCL 50 MG PO TABS
50.0000 mg | ORAL_TABLET | ORAL | 0 refills | Status: DC | PRN
  Filled 2021-09-25: qty 30, 5d supply, fill #0

## 2021-09-25 MED ORDER — TORSEMIDE 20 MG PO TABS
20.0000 mg | ORAL_TABLET | Freq: Every day | ORAL | 0 refills | Status: DC
Start: 2021-09-26 — End: 2021-10-07
  Filled 2021-09-25: qty 30, 30d supply, fill #0

## 2021-09-25 MED ORDER — AMIODARONE HCL 200 MG PO TABS
ORAL_TABLET | ORAL | 0 refills | Status: DC
  Filled 2021-09-25: qty 40, 10d supply, fill #0

## 2021-09-25 MED ORDER — MIDODRINE HCL 5 MG PO TABS
5.0000 mg | ORAL_TABLET | Freq: Three times a day (TID) | ORAL | 0 refills | Status: DC
  Filled 2021-09-25: qty 90, 30d supply, fill #0

## 2021-09-25 MED ORDER — PREDNISONE 10 MG PO TABS
ORAL_TABLET | ORAL | 0 refills | Status: DC
  Filled 2021-09-25: qty 51, 30d supply, fill #0

## 2021-09-25 MED ORDER — METHIMAZOLE 10 MG PO TABS
10.0000 mg | ORAL_TABLET | Freq: Every day | ORAL | 0 refills | Status: DC
  Filled 2021-09-25: qty 60, 60d supply, fill #0

## 2021-09-25 MED ORDER — PANTOPRAZOLE SODIUM 40 MG PO TBEC
40.0000 mg | DELAYED_RELEASE_TABLET | Freq: Every day | ORAL | 0 refills | Status: DC
  Filled 2021-09-25: qty 30, 30d supply, fill #0

## 2021-09-25 MED ORDER — SILDENAFIL CITRATE 20 MG PO TABS
20.0000 mg | ORAL_TABLET | Freq: Three times a day (TID) | ORAL | 0 refills | Status: DC
  Filled 2021-09-25: qty 90, 30d supply, fill #0

## 2021-09-25 MED ORDER — MIRTAZAPINE 7.5 MG PO TABS
7.5000 mg | ORAL_TABLET | Freq: Every day | ORAL | 0 refills | Status: DC
  Filled 2021-09-25: qty 30, 30d supply, fill #0

## 2021-09-25 MED ORDER — ALPRAZOLAM 0.25 MG PO TABS
0.2500 mg | ORAL_TABLET | Freq: Three times a day (TID) | ORAL | 0 refills | Status: DC | PRN
  Filled 2021-09-25: qty 30, 10d supply, fill #0

## 2021-09-25 MED ORDER — GABAPENTIN 100 MG PO CAPS
100.0000 mg | ORAL_CAPSULE | Freq: Three times a day (TID) | ORAL | 0 refills | Status: DC | PRN
  Filled 2021-09-25: qty 30, 10d supply, fill #0

## 2021-09-25 NOTE — Progress Notes (Signed)
Patient ID: [name], female   DOB: [birth year], 51 y.o.   MRN: [id number] ? ?Team Conference Report to Patient/Family ? ?Team Conference discussion was reviewed with the patient and caregiver, including goals, any changes in plan of care and target discharge date.  Patient and caregiver express understanding and are in agreement.  The patient has a target discharge date of 09/26/21. ? ?SW met with patient and provided team conference updates. Patient happy with date but would like to have recommendation for HB. The patient spouse has made room downstairs for the patient instead of sleeping upstairs.  ? ?Dyanne Iha ?09/25/2021, 1:52 PM  ?

## 2021-09-25 NOTE — Progress Notes (Signed)
?                                                       PROGRESS NOTE ? ? ?Subjective/Complaints: ? ?K+ improved (had missed 2 doses day before) ?Discussed discharge with pt  ? ?ROS: Patient denies CP, SOB, N/V/D ? ? ?Objective: ?  ?No results found. ?Recent Labs  ?  09/24/21 ?1829 09/25/21 ?0418  ?WBC 8.2 9.0  ?HGB 9.0* 9.3*  ?HCT 31.3* 32.5*  ?PLT 250 266  ? ? ?Recent Labs  ?  09/24/21 ?9371 09/24/21 ?1525 09/25/21 ?0418  ?NA 143  --  139  ?K 2.9* 4.2 4.1  ?CL 104  --  104  ?CO2 32  --  29  ?GLUCOSE 86  --  80  ?BUN 19  --  17  ?CREATININE 0.83  --  0.88  ?CALCIUM 8.9  --  9.1  ? ? ? ?Intake/Output Summary (Last 24 hours) at 09/25/2021 0913 ?Last data filed at 09/24/2021 2100 ?Gross per 24 hour  ?Intake 508 ml  ?Output --  ?Net 508 ml  ? ?  ? ?  ? ?Physical Exam: ?Vital Signs ?Blood pressure 102/84, pulse 80, temperature 98.8 ?F (37.1 ?C), resp. rate 14, height '5\' 3"'  (1.6 m), weight 98.1 kg, SpO2 100 %. ? ? ?General: No acute distress ?Mood and affect are appropriate ?Heart: Regular rate and rhythm no rubs murmurs or extra sounds ?Lungs: Clear to auscultation, breathing unlabored, no rales or wheezes ?Abdomen: Positive bowel sounds, soft nontender to palpation, nondistended ?Extremities: No clubbing, cyanosis, or edema ?Skin: No evidence of breakdown, no evidence of rash ?Neurologic: Cranial nerves II through XII intact, motor strength is 4/5 in bilateral deltoid, bicep, tricep, grip, hip flexor, knee extensors, ankle dorsiflexor and plantar flexor ?Sensory exam normal for light touch and pain in all 4 limbs. No limb ataxia or cerebellar signs. No abnormal tone appreciated.   ?Musculoskeletal: Full range of motion in all 4 extremities. No joint swelling ? ?Skin epidermis open on drain wound x 2 , no drainage or erythema ? ?Assessment/Plan: ?1. Functional deficits which require 3+ hours per day of interdisciplinary therapy in a comprehensive inpatient rehab setting. ?Physiatrist is providing close team supervision and  24 hour management of active medical problems listed below. ?Physiatrist and rehab team continue to assess barriers to discharge/monitor patient progress toward functional and medical goals ? ?Care Tool: ? ?Bathing ?   ?Body parts bathed by patient: Face, Right arm, Left arm, Chest, Abdomen, Right upper leg, Left upper leg, Right lower leg, Left lower leg  ?   ?  ?  ?Bathing assist Assist Level: Minimal Assistance - Patient > 75% ?  ?  ?Upper Body Dressing/Undressing ?Upper body dressing   ?What is the patient wearing?: Button up shirt ?   ?Upper body assist Assist Level: Minimal Assistance - Patient > 75% ?   ?Lower Body Dressing/Undressing ?Lower body dressing ? ? ?   ?What is the patient wearing?: Incontinence brief, Pants ? ?  ? ?Lower body assist Assist for lower body dressing: Minimal Assistance - Patient > 75% ?   ? ?Toileting ?Toileting Toileting Activity did not occur (Probation officer and hygiene only): N/A (no void or bm)  ?Toileting assist Assist for toileting: Maximal Assistance - Patient 25 - 49% ?  ?  ?Transfers ?  Chair/bed transfer ? ?Transfers assist ?   ? ?Chair/bed transfer assist level: Supervision/Verbal cueing ?  ?  ?Locomotion ?Ambulation ? ? ?Ambulation assist ? ?   ? ?Assist level: Supervision/Verbal cueing ?Assistive device: No Device ?Max distance: >457f  ? ?Walk 10 feet activity ? ? ?Assist ?   ? ?Assist level: Set up assist (LVAD cords mgmt) ?Assistive device: No Device  ? ?Walk 50 feet activity ? ? ?Assist   ? ?Assist level: Supervision/Verbal cueing ?Assistive device: No Device  ? ? ?Walk 150 feet activity ? ? ?Assist   ? ?Assist level: Supervision/Verbal cueing ?Assistive device: No Device ?  ? ?Walk 10 feet on uneven surface  ?activity ? ? ?Assist Walk 10 feet on uneven surfaces activity did not occur: Safety/medical concerns ? ? ?Assist level: Supervision/Verbal cueing ?Assistive device: Other (comment) (no device)  ? ?Wheelchair ? ? ? ? ?Assist Is the patient using a  wheelchair?: No ?Type of Wheelchair: Manual ?Wheelchair activity did not occur: Safety/medical concerns ? ?  ?   ? ? ?Wheelchair 50 feet with 2 turns activity ? ? ? ?Assist ? ?  ?Wheelchair 50 feet with 2 turns activity did not occur: Safety/medical concerns ? ? ?   ? ?Wheelchair 150 feet activity  ? ? ? ?Assist ? Wheelchair 150 feet activity did not occur: Safety/medical concerns ? ? ?   ? ?Blood pressure 102/84, pulse 80, temperature 98.8 ?F (37.1 ?C), resp. rate 14, height '5\' 3"'  (1.6 m), weight 98.1 kg, SpO2 100 %. ? ?Medical Problem List and Plan: ?1. Functional deficits secondary to debility/acute chronic systolic congestive heart failure/nonischemic cardiomyopathy with ICD status post LVAD 3/15. ?            -patient may not shower ?            -ELOS/Goals: plan d/c in am , pt will notify family ?Team conference today please see physician documentation under team conference tab, met with team  to discuss problems,progress, and goals. Formulized individual treatment plan based on medical history, underlying problem and comorbidities.  ?2.  Antithrombotics: ?-DVT/anticoagulation:  Pharmaceutical: Coumadin ?            -antiplatelet therapy: Aspirin 81 mg daily ?3. Pain Management: Oxycodone as needed Neurontin 100 mg 3 times daily as needed ?4. Mood: Remeron 7.5 mg nightly, anxiety 0.25 mg 3 times daily as needed ?            -antipsychotic agents: N/A ?5. Neuropsych: This patient is capable of making decisions on her own behalf. ?6. Skin/Wound Care: Routine skin checks ?            -continue q-shift dressing changes to right shoulder hematoma ?7. Fluids/Electrolytes/Nutrition: Routine in and outs with follow-up chemistries during admit ?8.  Acute blood loss anemia.  Follow-up CBC ?9.  Hypotension.  ProAmatine 5 mg 3 times daily ?10.  Atrial fibrillation.  Continue amiodarone 400 mg twice daily per cardiology services as well as Lanoxin 0.0625 mg daily ? 4/3 had palpitations last night. Were self-limited.  observe ?11.  CKD stage III.  Follow-up chemistries ? ?  Latest Ref Rng & Units 09/25/2021  ?  4:18 AM 09/24/2021  ?  3:25 PM 09/24/2021  ?  4:40 AM  ?BMP  ?Glucose 70 - 99 mg/dL 80    86    ?BUN 6 - 20 mg/dL 17    19    ?Creatinine 0.44 - 1.00 mg/dL 0.88    0.83    ?  Sodium 135 - 145 mmol/L 139    143    ?Potassium 3.5 - 5.1 mmol/L 4.1   4.2   2.9    ?Chloride 98 - 111 mmol/L 104    104    ?CO2 22 - 32 mmol/L 29    32    ?Calcium 8.9 - 10.3 mg/dL 9.1    8.9    ? Low again 4/4 , on high dose K+- just increased  , repeat later, ask for cardiology input on this- only received 1 dose of po KCL yesterday  ?12.  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  Continue aspirin therapy ?13.  Morbid obesity.  BMI 39.17.  Dietary follow-up ?            -pt aware of change needed in diet ?14.  Hyperthyroidism.  Continue Tapazole.  Patient initially hypothyroidism but has been not taking her medication for 1 month prior to admission.  Currently maintained on steroid therapy for hyperthyroid condition.  Per internal medicine continue prednisone 20 mg daily x1 month then reduce to 10 mg until endocrinology follow-up as outpatient ?15.  Hyperlipidemia.  Crestor ?16.  Left ovarian mass.  Noted on CT abdomen pelvis.  Unable to get MRI due to ICD.  Tumor markers within normal limits discussed with GYN.  Follow-up outpatient ?17.  Decreased nutritional storage.  Currently on a regular diet.              ?  ? 18.  HypoK likely related to diuretic use TOrsemide reduced per Cardiology 4/2   ? -continue diuretics/k+ supp per cards recs ?19. Transaminitis: ? -pt hasn't had these checked since 3/16-17 when there was mild elevation ? -fairly large jump today in AP, AST, ALT ? -could be reactive, could also be related to statin ?  ?Would defer to cards on any statin change ? ?LOS: ?5 days ?A FACE TO FACE EVALUATION WAS PERFORMED ? ?Luanna Salk Kieran Nachtigal ?09/25/2021, 9:13 AM  ? ? ? ?

## 2021-09-25 NOTE — Progress Notes (Signed)
Inpatient Rehabilitation Discharge Medication Review by a Pharmacist ? ?A complete drug regimen review was completed for this patient to identify any potential clinically significant medication issues. ? ?High Risk Drug Classes Is patient taking? Indication by Medication  ?Antipsychotic No   ?Anticoagulant Yes Warfarin- LVAD HM3. INR goal 2-2.5  ?Antibiotic No   ?Opioid Yes Tramadol- acute pain  ?Antiplatelet Yes Aspirin- CVA prophylaxis  ?Hypoglycemics/insulin No   ?Vasoactive Medication Yes Torsemide, Revatio, Quinidine, midodrine, digoxin, amiodarone- hypertension, PAH, hypotension, arrhythmias  ?Chemotherapy No   ?Other Yes Protonix- GERD ?Crestor- HLD ?Remeron- sleep  ? ? ? ?Type of Medication Issue Identified Description of Issue Recommendation(s)  ?Drug Interaction(s) (clinically significant) ?    ?Duplicate Therapy ?    ?Allergy ?    ?No Medication Administration End Date ?    ?Incorrect Dose ?    ?Additional Drug Therapy Needed ?    ?Significant med changes from prior encounter (inform family/care partners about these prior to discharge).    ?Other ?    ? ? ?Clinically significant medication issues were identified that warrant physician communication and completion of prescribed/recommended actions by midnight of the next day:  No ? ?Time spent performing this drug regimen review (minutes):  30 ? ? ?Tyhesha Dutson BS, PharmD, BCPS ?Clinical Pharmacist ?09/26/2021 8:24 AM ?

## 2021-09-25 NOTE — Progress Notes (Signed)
Physical Therapy Session Note ? ?Patient Details  ?Name: [name] ?MRN: [id number] ?Date of Birth: 20-Jul-1970 ? ?Today's Date: 09/25/2021 ?PT Individual Time: 5093-2671 ?PT Individual Time Calculation (min): 57 min  ? ?Short Term Goals: ?Week 1:  PT Short Term Goal 1 (Week 1): Pt will perform bed mobility maintaining full sternal precautions with supervision. ?PT Short Term Goal 2 (Week 1): Pt will consistently perform sit <> stand transfers from multiple heights with supervision. ?PT Short Term Goal 3 (Week 1): Pt will consistently perform stand pivot transfers with supervision. ?PT Short Term Goal 4 (Week 1): Pt will ambulate at least 45 ft with no AD and CGA. ?PT Short Term Goal 5 (Week 1): Pt will complete at least 8 steps with BHR and CGA. ? ? ?Skilled Therapeutic Interventions/Progress Updates:  ?Patient getting up from recliner with RN on entrance to room. Patient alert and agreeable to PT session after going to bathroom. ? ?Patient with no pain complaint at start of session. ? ?Therapeutic Activity: ?Bed Mobility: Patient performed sit-->supine with Mod I using bed features. No cues required. Pt elevates HOB to improved position for breathing and LVAD. ?Transfers: Patient performed sit<>stand and stand pivot transfers throughout session with supervision/ mod I. Provided verbal cues for locking brakes on rollator for each transfer. ? ?Gait Training:  ?Patient ambulated community distances on level and uneven surfaces using rollator with close and distant supervision. Demonstrated good, consistent pace with no lean into rollator with UE's. Provided vc/ tc for locking rollator brakes during transitions.Continued education provided re: energy conservation and consistent self assessment of fatigue level. ? ?IND in LVAD switch from battery pack to base unit.  ? ?Patient supine  in bed at end of session with brakes locked, bed alarm set, and all needs within reach. ? ? ?Therapy Documentation ?Precautions:   ?Precautions ?Precautions: Fall, Sternal ?Precaution Comments: recent LVAD,  hx of L MCA CVA with residual R sided weakness ?Restrictions ?Weight Bearing Restrictions: No ?General: ?  ?Vital Signs: ?Therapy Vitals ?Temp: 97.8 ?F (36.6 ?C) ?Pulse Rate: 90 ?Resp: 20 ?BP: (!) 107/40 ?Patient Position (if appropriate): Lying ?Oxygen Therapy ?SpO2: 100 % ?O2 Device: Room Air ?Pain: ? No complaint of pain during this session.  ? ?Therapy/Group: Individual Therapy ? ?Alger Simons ?09/25/2021, 4:11 PM  ?

## 2021-09-25 NOTE — Discharge Summary (Signed)
Physician Discharge Summary  ?Patient ID: ?[name] ?MRN: [id number] ?DOB/AGE: [birth year] 50 y.o. ? ?Admit date: 09/20/2021 ?Discharge date: 09/26/2021 ? ?Discharge Diagnoses:  ?Principal Problem: ?  Debility ?Active Problems: ?  LVAD (left ventricular assist device) present ([place]) ?  Hypokalemia ?  Paroxysmal atrial fibrillation (HCC) ?  Transaminitis ?CKD stage III ?Mood stabilization ?Acute blood loss anemia ?History of left MCA CVA ?Morbid obesity ?Hypothyroidism ?Hyperlipidemia ?Left ovarian mass ? ?Discharged Condition: Stable ? ?Significant Diagnostic Studies: ?DG Orthopantogram ? ?Result Date: 08/27/2021 ?CLINICAL DATA:  Screening prior to possible cardiac surgery. No chest complaints. EXAM: ORTHOPANTOGRAM/PANORAMIC COMPARISON:  None. FINDINGS: Normal appearing teeth without caries or periapical lucency. IMPRESSION: 1. Normal. Electronically Signed   By: Titus Dubin M.D.   On: 08/27/2021 12:55  ? ?CARDIAC CATHETERIZATION ? ?Result Date: 08/28/2021 ?1. Elevated right and left heart filling pressures. 2. 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 3. Low cardiac output, marked difference between Fick and thermodilution. 4. PAPI low at 1.33. Discussed with Dr. Cyndia Bent.  Hopefully can get Impella 5.5 in OR tomorrow.  In the meantime, will increase milrinone to 0.375 and push diuresis.   ? ?DG Chest Port 1 View ? ?Result Date: 09/23/2021 ?CLINICAL DATA:  Acute respiratory distress EXAM: PORTABLE CHEST 1 VIEW COMPARISON:  09/18/2021 FINDINGS: Postoperative changes in the mediastinum. Cardiac pacemaker. Left ventricular assist device. Enteric tube. Appliances are unchanged since prior study. Shallow inspiration. Infiltration or atelectasis in the right lower lung is unchanged. No pleural effusions. No pneumothorax. Diffuse enlargement of the cardiac silhouette. Surgical clips in the right axilla. IMPRESSION: No change since prior study. Persistent enlargement of the cardiac silhouette. Persistent  infiltration or atelectasis in the right lung base. Electronically Signed   By: Lucienne Capers M.D.   On: 09/23/2021 03:03  ? ?DG Chest Port 1 View ? ?Result Date: 09/18/2021 ?CLINICAL DATA:  LVAD. EXAM: PORTABLE CHEST 1 VIEW COMPARISON:  Chest radiograph 09/15/2021 FINDINGS: Patient has an LVAD and left chest ICD. Stable cardiomegaly. Feeding tube extends in the abdomen and the tip is incompletely visualized. Right arm PICC line tip terminates in the right atrium region. Patchy densities in the right lower lung are minimally changed. Slightly improved aeration in the lungs compared to the recent comparison examination. IMPRESSION: 1. Slightly improved aeration in the lungs with persistent densities in the right lower chest. These densities are nonspecific but may be related to atelectasis. 2. Support apparatuses as described. 3. Cardiomegaly. Electronically Signed   By: Markus Daft M.D.   On: 09/18/2021 08:21  ? ?DG Chest Port 1 View ? ?Result Date: 09/15/2021 ?CLINICAL DATA:  Follow-up.  Left ventricular assist device. EXAM: PORTABLE CHEST 1 VIEW COMPARISON:  09/13/2021 and older studies. FINDINGS: Improved lung aeration with decreased interstitial and hazy airspace opacities compared to the most recent prior exam. Lung volumes remain low. Suspect small residual effusions. No pneumothorax. Left ventricular assist device is stable as is the left anterior chest wall AICD. IMPRESSION: 1. Mild interval improvement in lung aeration consistent with improved pulmonary edema. 2. No other change.  Stable left ventricular assist device. Electronically Signed   By: Lajean Manes M.D.   On: 09/15/2021 08:10  ? ?DG Chest Port 1 View ? ?Result Date: 09/13/2021 ?CLINICAL DATA:  Follow-up LVAD. Denies chest pain and shortness of breath. Prior history CHF. EXAM: PORTABLE CHEST 1 VIEW COMPARISON:  09/11/2021 at 5:15 a.m. FINDINGS: 5:13 a.m., 09/13/2021. Again noted and unchanged are and LVAD obscuring the lateral left base, left  chest  AID with 2 right ventricular wires, and right PICC terminating in the right atrium. The heart stably enlarged. There are intact median sternotomy sutures. There is a stable appearance perihilar vascular prominence, coarse interstitial changes in the right lower lung field and small right pleural effusion, and perihilar hazy opacities which could be edema and/or pneumonia. There is no new, further or worsening lung opacity. Overall aeration seems unchanged.  Stable mediastinum. IMPRESSION: Stable overall aeration with no interval change in bilateral lung opacities and perihilar vascular prominence. Electronically Signed   By: Telford Nab M.D.   On: 09/13/2021 06:38  ? ?DG Chest Port 1 View ? ?Result Date: 09/11/2021 ?CLINICAL DATA:  Decompensated heart failure. EXAM: PORTABLE CHEST 1 VIEW COMPARISON:  Portable chest yesterday at 5:16 a.m. FINDINGS: 5:15 a.m., 09/11/2021. LVAD, left chest AID and wire placements and right PICC tip in the right atrium are unaltered. Cardiomegaly is again noted with median sternotomy sutures intact. Perihilar vascular prominence is improved on today's exam and mild central interstitial edema is also improved. Coarse interstitial opacities in the right lower lung field are again noted alongside the elevated hemidiaphragm and could be atelectasis or consolidation. There is bilateral perihilar hazy opacity which is less prominent than yesterday, likely indicating ground-glass edema, underlying pneumonia not strictly excluded. There is a small right pleural effusion which is unchanged. The lungs are clear elsewhere. Stable mediastinum. Right axillary surgical clips. IMPRESSION: 1. Improving perihilar vascular congestion and central edema, with improving perihilar haziness. 2. Coarse interstitial opacities in right lower lung field could be atelectasis or consolidation, and were noted previously. 3. Small right pleural effusion also unchanged. 4. Stable appearance of support devices.  Electronically Signed   By: Telford Nab M.D.   On: 09/11/2021 07:15  ? ?DG Chest Port 1 View ? ?Result Date: 09/10/2021 ?CLINICAL DATA:  LVAD.  History of CHF, defibrillator. EXAM: PORTABLE CHEST 1 VIEW COMPARISON:  Portable chest yesterday at 5:11 a.m. FINDINGS: 5:16 a.m., 09/10/2021. Left chest AICD and wiring, LVAD, and right PICC line are unchanged. Right PICC again terminates in the right atrium. Heart is enlarged. The mediastinal configuration is stable. Central vascular prominence and mild central edema continue to be noted as well as a small right pleural effusion and asymmetric opacity of the right lower lung field consistent with atelectasis or consolidation. Elevation of the right hemidiaphragm is also unchanged. Remainder of the lungs are clear. There is stable overall aeration. IMPRESSION: Stable atelectasis or airspace disease right lower lung field, with cardiomegaly, perihilar vascular congestion and mild central edema. Overall aeration, small right pleural effusion and mild central edema are unchanged. Electronically Signed   By: Telford Nab M.D.   On: 09/10/2021 06:35  ? ?DG Chest Port 1 View ? ?Result Date: 09/09/2021 ?CLINICAL DATA:  History of hypertension, MI, cardiomyopathy. Defibrillator placement and recent LVAD placement EXAM: PORTABLE CHEST 1 VIEW COMPARISON:  Chest radiograph 09/08/2021 FINDINGS: The left chest wall cardiac device and associated leads, median sternotomy wires, and LVAD are stable. The right upper extremity PICC terminating in the right atrium is stable. The heart is enlarged, unchanged. The upper mediastinal contours are stable. Lung volumes are low. Is no focal consolidation. There is no definite overt pulmonary edema. There is no significant pleural effusion, though the left costophrenic angle is partially obscured. There is no appreciable pneumothorax. There is no acute osseous abnormality. IMPRESSION: Low lung volumes. No new or worsening focal airspace disease.  Overall, no significant interval change since 09/08/2021. Electronically Signed  By: Valetta Mole M.D.   On: 09/09/2021 07:02  ? ?DG Chest Port 1 View ? ?Result Date: 09/08/2021 ?CLINICAL DATA:  Shortness o

## 2021-09-25 NOTE — Progress Notes (Signed)
Occupational Therapy Session Note ? ?Patient Details  ?Name: [name] ?MRN: [id number] ?Date of Birth: 03/26/1971 ? ?Today's Date: 09/25/2021 ?OT Individual Time: 4098-1191 ?OT Individual Time Calculation (min): 15 min (make up) ? ? ?Short Term Goals: ?Week 1:  OT Short Term Goal 1 (Week 1): STG = LTG 2/2 ELOS ? ?Skilled Therapeutic Interventions/Progress Updates:  ?  1:1. Pt received in recliner agreeable to OT make up time. Pt completes ambulatory transfers to/from dayroom with Rolator with CGA fading to supervision. Pt requires cuing for brakes on Rolator. Pt sits to [employer] focusing on [facility] and visual perception. NO cuing required. Exited session with pt seated in ed, exit alarm on and call light in reach ? ? ?Therapy Documentation ?Precautions:  ?Precautions ?Precautions: Fall, Sternal ?Precaution Comments: recent LVAD,  hx of L MCA CVA with residual R sided weakness ?Restrictions ?Weight Bearing Restrictions: No ?General: ?  ? ? ?Therapy/Group: Individual Therapy ? ?[name] ?09/25/2021, 10:20 AM ?

## 2021-09-25 NOTE — Progress Notes (Signed)
Inpatient Rehabilitation Care Coordinator ?Discharge Note  ? ?Patient Details  ?Name: [name] ?MRN: [id number] ?Date of Birth: [birth year] ? ? ?Discharge location: Home ? ?Length of Stay: 6 Days ? ?Discharge activity level: Mod I ? ?Home/community participation: spouse ? ?Patient response WU:[id number] Literacy - How often do you need to have someone help you when you read instructions, pamphlets, or other written material from your doctor or pharmacy?: Never ? ?Patient response YN:[id number] Isolation - How often do you feel lonely or isolated from those around you?: Never ? ?Services provided included: MD, RD, PT, OT, SLP, RN, CM, TR, Pharmacy, SW ? ?Financial Services:  ?Charity fundraiser Utilized: Private Insurance ?BCBS COMM ? ?Choices offered to/list presented to: patient ? ?Follow-up services arranged:  ?Outpatient ?   ?Outpatient Servicies: [facility] ?  ?  ? ?Patient response to transportation need: ?Is the patient able to respond to transportation needs?: Yes ?In the past 12 months, has lack of transportation kept you from medical appointments or from getting medications?: No ?In the past 12 months, has lack of transportation kept you from meetings, work, or from getting things needed for daily living?: No ? ? ? ?Comments (or additional information): ? ?Patient/Family verbalized understanding of follow-up arrangements:  Yes ? ?Individual responsible for coordination of the follow-up plan: self ? ?Confirmed correct DME delivered: Dyanne Iha 09/25/2021   ? ?Dyanne Iha ?

## 2021-09-25 NOTE — Plan of Care (Signed)
?  Problem: Consults ?Goal: RH GENERAL PATIENT EDUCATION ?Description: See Patient Education module for education specifics. ?Outcome: Progressing ?  ?Problem: RH BOWEL ELIMINATION ?Goal: RH STG MANAGE BOWEL WITH ASSISTANCE ?Description: STG Manage Bowel with mod I  Assistance. ?Outcome: Progressing ?Goal: RH STG MANAGE BOWEL W/MEDICATION W/ASSISTANCE ?Description: STG Manage Bowel with Medication with mod I Assistance. ?Outcome: Progressing ?  ?Problem: RH BLADDER ELIMINATION ?Goal: RH STG MANAGE BLADDER WITH ASSISTANCE ?Description: STG Manage Bladder With toileting Assistance ?Outcome: Progressing ?  ?Problem: RH SAFETY ?Goal: RH STG ADHERE TO SAFETY PRECAUTIONS W/ASSISTANCE/DEVICE ?Description: STG Adhere to Safety Precautions With cues Assistance/Device. ?Outcome: Progressing ?  ?Problem: RH PAIN MANAGEMENT ?Goal: RH STG PAIN MANAGED AT OR BELOW PT'S PAIN GOAL ?Description: At or below level 4 with prns ?Outcome: Progressing ?  ?Problem: RH KNOWLEDGE DEFICIT GENERAL ?Goal: RH STG INCREASE KNOWLEDGE OF SELF CARE AFTER HOSPITALIZATION ?Description: Patient and spouse will be able to manage care at discharge using handouts and educational resources independently ?Outcome: Progressing ?  ?Problem: Education: ?Goal: Knowledge of the prescribed therapeutic regimen will improve ?Description: Patient and spouse will be able to manage care , medications, skin care, and dietary modifications at discharge using handouts and educational resources independently ?Outcome: Progressing ?  ?Problem: Activity: ?Goal: Risk for activity intolerance will decrease ?Description: Patient and spouse will be able to manage care , medications, skin care, and dietary modifications at discharge using handouts and educational resources independently ?Outcome: Progressing ?  ?Problem: Cardiac: ?Goal: Ability to maintain an adequate cardiac output will improve ?Description: Patient and spouse will be able to manage care , medications, skin  care, and dietary modifications at discharge using handouts and educational resources independently ?Outcome: Progressing ?  ?Problem: Fluid Volume: ?Goal: Risk for excess fluid volume will decrease ?Description: Patient and spouse will be able to manage care , medications,and dietary modifications at discharge using handouts and educational resources independently ?Outcome: Progressing ?  ?Problem: Respiratory: ?Goal: Will regain and/or maintain adequate ventilation ?Outcome: Progressing ?  ?

## 2021-09-25 NOTE — Progress Notes (Signed)
LVAD Coordinator Rounding Note: ? ?Admitted 09/20/21 to CIR post LVAD implant due to elevated BMI.  ? ?Patient sitting in recliner upon my arrival. She says she feels great today. She is in regular clothes, and has completed 2 therapy sessions so far this morning. Says she feels stronger and less anxious today. She is looking forward to discharging home this week. She says she and her husband will "be ready".  VAD home equipment will be delivered to room prior to discharge.  ? ?Dressing changed today. See documentation below.  ? ?Vital signs: ?Temp: 98.1 ?HR: 91 ?Doppler Pressure: not documented ?Auto BP:   ?O2 Sat: 99% on RA  ?Wt:  222.4>218.9>216.3 lbs ? ?LVAD interrogation:  ?Speed: 5800 ?Flow: 5.0 ?Power: 4.6w ?PI: 3.0 ?Hct: 31 ? ?Alarms: none ?Events: none ?Hematocrit: 31 ? ?Fixed speed: 5800 ?Low speed limit: 5500 ? ?Drive Line: Existing VAD dressing removed and site care performed using sterile technique. Drive line exit site cleaned with Chlora prep applicators x 2, RINSED WITH SALINE, allowed to dry, and gauze dressing with Silver strip applied. Exit site healing and unincorporated, the velour is fully implanted at exit site. 2 sutures removed. Large amount of serous drainage noted.  No redness, tenderness, foul odor or rash noted. Drive line anchor secure. Pt will need daily dressing changes using daily kit per VAD coordinator or caregiver, [name]. Next dressing change due: 09/26/21.  ? ? ? ?Labs:  ?LDH trend:  490>450>373>361>300>313  ? ?INR trend:  2.4>2.8>3.1>3.1>3.2>3.2 ? ? ?Anticoagulation Plan: ?-INR Goal: 2.0 - 2.5 ?-ASA Dose: 81 mg  ? ?Device: Medtronic single ICD ?- Therapies: on 200 bpm ?- Pacing:  VVI 40 ?-  Last check 09/18/21 (therapy turned on after surgery/transfer to [facility]) ?        ? ?Plan/Recommendations:  ?1. Page VAD coordinators with VAD equipment and drive line concerns. ?2. Daily drive line dressing change per VAD coordinator or caregiver. Next dressing change due 09/26/21. ? ?[name]  RN ?VAD Coordinator  ?Office: [phone]  ?24/7 Pager: [phone]  ? ? ? ?  ?

## 2021-09-25 NOTE — Progress Notes (Signed)
Physical Therapy Discharge Summary ? ?Patient Details  ?Name: LERONDA LEWERS ?MRN: [id number] ?Date of Birth: 05-28-1971 ? ?Today's Date: 09/25/2021 ? ? ?Patient has met 8 of 8 long term goals due to improved activity tolerance, improved balance, increased strength, and functional use of  right upper extremity and right lower extremity.  Patient to discharge at an ambulatory level Supervision.   Patient's care partner is independent to provide the necessary physical assistance at discharge. ? ?Reasons goals not met: n/a ? ?Recommendation:  ?Patient will benefit from ongoing skilled PT services in outpatient setting to continue to advance safe functional mobility, address ongoing impairments in strength, coordination, balance, activity tolerance, cognition, safety awareness, and to minimize fall risk. ? ?Equipment: ?Rollator, hospital bed ? ?Reasons for discharge: treatment goals met and discharge from hospital ? ?Patient/family agrees with progress made and goals achieved: Yes ? ?PT Discharge ?Precautions/Restrictions ?Precautions ?Precautions: Fall;Sternal ?Precaution Comments: recent LVAD,  hx of L MCA CVA with residual R sided weakness ?Restrictions ?Weight Bearing Restrictions: No ?Vital Signs ?Therapy Vitals ?Temp: 97.8 ?F (36.6 ?C) ?Pulse Rate: 90 ?Resp: 20 ?BP: (!) 107/40 ?Patient Position (if appropriate): Lying ?Oxygen Therapy ?SpO2: 100 % ?O2 Device: Room Air ?Pain ?Pain Assessment ?Pain Scale: 0-10 ?Pain Score: 0-No pain ?Pain Interference ?Pain Interference ?Pain Effect on Sleep: 1. Rarely or not at all ?Pain Interference with Therapy Activities: 1. Rarely or not at all ?Pain Interference with Day-to-Day Activities: 1. Rarely or not at all ?Vision/Perception  ?Vision - History ?Ability to See in Adequate Light: 0 Adequate ?Perception ?Perception: Within Functional Limits ?Praxis ?Praxis: Intact  ?Cognition ?Overall Cognitive Status: Within Functional Limits for tasks assessed ?Arousal/Alertness:  Awake/alert ?Orientation Level: Oriented X4 ?Attention: Divided ?Divided Attention: Appears intact ?Memory: Appears intact ?Awareness: Appears intact ?Problem Solving: Appears intact ?Safety/Judgment: Appears intact ?Comments: able to recall and adhere to sternal precautions, general LVAD care ?Sensation ?Sensation ?Light Touch: Appears Intact ?Hot/Cold: Appears Intact ?Proprioception: Appears Intact ?Stereognosis: Appears Intact ?Coordination ?Gross Motor Movements are Fluid and Coordinated: Yes ?Fine Motor Movements are Fluid and Coordinated: Yes ?Coordination and Movement Description: mild residual R sided hemipareisis, generalized deconditioning ?Motor  ?Motor ?Motor: Other (comment) (residual R hemipareisis) ?Motor - Discharge Observations: residual R sided hemipareisis, generalized deconditioning but moderately improved since eval  ?Mobility ?Bed Mobility ?Bed Mobility: Supine to Sit;Sit to Supine ?Supine to Sit: Independent with assistive device ?Sit to Supine: Independent with assistive device ?Transfers ?Transfers: Sit to Stand;Stand to [employer] Transfers ?Sit to Stand: Independent with assistive device ?Stand to Sit: Independent with assistive device ?Stand Pivot Transfers: Independent with assistive device ?Stand Pivot Transfer Details: Verbal cues for precautions/safety ?Transfer (Assistive device): Rollator ?Locomotion  ?Gait ?Ambulation: Yes ?Gait Assistance: Supervision/Verbal cueing ?Assistive device: Rollator ?Gait Assistance Details: Verbal cues for precautions/safety ?Gait ?Gait: Yes ?Gait Pattern: Within Functional Limits ?Gait Pattern: Decreased stride length;Step-through pattern (increased lateral hip sway bilaterally) ?Gait velocity: reduced ?Stairs / Additional Locomotion ?Stairs: Yes ?Stairs Assistance: Supervision/Verbal cueing ?Stair Management Technique: Two rails ?Height of Stairs: 6 ?Ramp: Supervision/Verbal cueing ?Curb: Supervision/Verbal cueing ?Wheelchair Mobility ?Wheelchair  Mobility: No  ?Trunk/Postural Assessment  ?Cervical Assessment ?Cervical Assessment: Within Functional Limits ?Thoracic Assessment ?Thoracic Assessment: Exceptions to [facility] (rounded shoulders) ?Lumbar Assessment ?Lumbar Assessment: Within Functional Limits ?Postural Control ?Postural Control: Within Functional Limits  ?Balance ?Balance ?Balance Assessed: Yes ?Static Sitting Balance ?Static Sitting - Balance Support: Feet supported ?Static Sitting - Level of Assistance: 7: Independent ?Dynamic Sitting Balance ?Dynamic Sitting - Balance Support: Feet supported;During functional activity ?Dynamic Sitting -  Level of Assistance: 6: Modified independent (Device/Increase time) ?Static Standing Balance ?Static Standing - Balance Support: During functional activity ?Static Standing - Level of Assistance: 6: Modified independent (Device/Increase time) ?Dynamic Standing Balance ?Dynamic Standing - Balance Support: During functional activity;Bilateral upper extremity supported ?Dynamic Standing - Level of Assistance: 6: Modified independent (Device/Increase time) ?Extremity Assessment  ?RUE Assessment ?RUE Assessment: Within Functional Limits ?General Strength Comments: mild R HP from previous CVA, grossly WFL for ADL ?LUE Assessment ?LUE Assessment: Within Functional Limits ?General Strength Comments: limited [place] due to bruising on L digits, grossly WFL for ADL ?RLE Assessment ?RLE Assessment: Within Functional Limits ?General Strength Comments: Grossly 4/5 proximal to distal ?LLE Assessment ?LLE Assessment: Within Functional Limits ?General Strength Comments: Grossly 4/ 5 proximal to distal ? ? ? ?Alger Simons ?09/25/2021, 5:06 PM ?

## 2021-09-25 NOTE — Progress Notes (Signed)
?  Subjective: ?Doing well and progressing at VAD speed 5800 rpm ?Sternal incision healing well ?Impella incision with skin separation from old hematoma ?  No infection,  no cellulitis- needs daily betadine swab and dry dressing ? ?Objective: ?Vital signs in last 24 hours: ?Temp:  [98 ?F (36.7 ?C)-98.8 ?F (37.1 ?C)] 98.8 ?F (37.1 ?C) (04/05 0544) ?Pulse Rate:  [79-89] 80 (04/05 0544) ?Resp:  [14-18] 14 (04/05 0544) ?BP: (95-102)/(75-84) 102/84 (04/05 0544) ?SpO2:  [100 %] 100 % (04/05 0544) ? ?Hemodynamic parameters for last 24 hours: ?  ? ?Intake/Output from previous day: ?04/04 0701 - 04/05 0700 ?In: 508 [P.O.:358; IV Piggyback:50] ?Out: -  ?Intake/Output this shift: ?No intake/output data recorded. ? ?EXAM ? ?Normal HM3 hum ?No edema ?Lungs clear ? ?Lab Results: ?Recent Labs  ?  09/24/21 ?4034 09/25/21 ?0418  ?WBC 8.2 9.0  ?HGB 9.0* 9.3*  ?HCT 31.3* 32.5*  ?PLT 250 266  ? ?BMET:  ?Recent Labs  ?  09/24/21 ?7425 09/24/21 ?1525 09/25/21 ?0418  ?NA 143  --  139  ?K 2.9* 4.2 4.1  ?CL 104  --  104  ?CO2 32  --  29  ?GLUCOSE 86  --  80  ?BUN 19  --  17  ?CREATININE 0.83  --  0.88  ?CALCIUM 8.9  --  9.1  ?  ?PT/INR:  ?Recent Labs  ?  09/25/21 ?0418  ?LABPROT 32.2*  ?INR 3.2*  ? ?ABG ?   ?Component Value Date/Time  ? PHART 7.487 (H) 09/10/2021 0039  ? HCO3 30.1 (H) 09/10/2021 0039  ? TCO2 31 09/10/2021 0039  ? ACIDBASEDEF 1.0 09/03/2021 1818  ? O2SAT 66.6 09/20/2021 0302  ? ?CBG (last 3)  ?Recent Labs  ?  09/23/21 ?0236 09/23/21 ?0625 09/23/21 ?1137  ?GLUCAP 118* 114* 129*  ? ? ?Assessment/Plan: ?S/P HM3 for NICM ?No sig surgical issues, INR supra therapeutic ? ready for DC soon ? ? ? LOS: 5 days  ? ? ?Dahlia Byes ?09/25/2021 ?  ?

## 2021-09-25 NOTE — Progress Notes (Signed)
ANTICOAGULATION CONSULT NOTE - Follow Up Consult ? ?Pharmacy Consult for Warfarin ?Indication: LVAD HM3 ? ?No Known Allergies ? ?Patient Measurements: ?Height: '5\' 3"'$  (160 cm) ?Weight: 98.1 kg (216 lb 4.8 oz) ?IBW/kg (Calculated) : 52.4 ? ?Vital Signs: ?Temp: 97.8 ?F (36.6 ?C) (04/05 1414) ?Temp Source: Oral (04/05 1026) ?BP: 107/40 (04/05 1414) ?Pulse Rate: 90 (04/05 1414) ? ?Labs: ?Recent Labs  ?  09/23/21 ?0825 09/24/21 ?5697 09/25/21 ?0418  ?HGB 9.6* 9.0* 9.3*  ?HCT 32.8* 31.3* 32.5*  ?PLT 259 250 266  ?LABPROT 32.2* 32.7* 32.2*  ?INR 3.1* 3.2* 3.2*  ?CREATININE 0.84 0.83 0.88  ? ? ? ?Estimated Creatinine Clearance: 85.4 mL/min (by C-G formula based on SCr of 0.88 mg/dL). ? ? ?Medications:  ?Scheduled:  ? (feeding supplement) PROSource Plus  30 mL Oral BID BM  ? amiodarone  400 mg Oral BID  ? aspirin EC  81 mg Oral Daily  ? Chlorhexidine Gluconate Cloth  6 each Topical Q12H  ? digoxin  0.0625 mg Oral Daily  ? methimazole  10 mg Oral Daily  ? methimazole  5 mg Oral QPM  ? midodrine  5 mg Oral TID WC  ? mirtazapine  7.5 mg Oral QHS  ? pantoprazole  40 mg Oral Daily  ? potassium chloride  60 mEq Oral TID  ? predniSONE  20 mg Oral Q breakfast  ? quiniDINE gluconate  324 mg Oral BID  ? rosuvastatin  40 mg Oral QHS  ? sildenafil  20 mg Oral TID  ? [START ON 09/26/2021] torsemide  20 mg Oral Daily  ? Warfarin - Pharmacist Dosing Inpatient   Does not apply [id number]  ? ? ?Assessment: ?51 yo F who is s/p HM3 LVAD placement on 3/14. Patient has been started on warfarin and aspirin. Aspirin '81mg'$  will be continued indefinitely given prior CVA.  ? ?INR is still above goal at 3.2 with low doses, pt reporting great appetite. LDH and CBC stable. ? ?Goal of Therapy:  ?INR 2-2.5 ?Monitor platelets by anticoagulation protocol: Yes ?  ?Plan:  ?Hold warfarin today  ?Plan to DC home tomorrow with warfarin '1mg'$  tabs and take 0.'5mg'$  daily until oupt follow up  ?Daily INR, CBC.  ?Monitor for signs/symptoms of bleeding. ? ? ? ?Bonnita Nasuti  Pharm.D. CPP, BCPS ?Clinical Pharmacist ?[phone] ?09/25/2021 2:43 PM  ? ?Please check AMION for all [place] numbers ?09/25/2021 ? ?

## 2021-09-25 NOTE — Progress Notes (Signed)
Patient ID: [name], female   DOB: Feb 14, 1971, 51 y.o.   MRN: [id number] ? ?Hospital Bed, Bedside Commode and Rollator ordered through [employer].  ?

## 2021-09-25 NOTE — Progress Notes (Signed)
Physical Therapy Session Note ? ?Patient Details  ?Name: [name] ?MRN: [id number] ?Date of Birth: 1970/09/01 ? ?Today's Date: 09/25/2021 ?PT Individual Time: 0902-1000 and 1650-1730 ?PT Individual Time Calculation (min): 58 min and 36mn  ? ?Short Term Goals: ?Week 1:  PT Short Term Goal 1 (Week 1): Pt will perform bed mobility maintaining full sternal precautions with supervision. ?PT Short Term Goal 2 (Week 1): Pt will consistently perform sit <> stand transfers from multiple heights with supervision. ?PT Short Term Goal 3 (Week 1): Pt will consistently perform stand pivot transfers with supervision. ?PT Short Term Goal 4 (Week 1): Pt will ambulate at least 45 ft with no AD and CGA. ?PT Short Term Goal 5 (Week 1): Pt will complete at least 8 steps with BHR and CGA. ?  ? ? ?Skilled Therapeutic Interventions/Progress Updates:  ? ?Session 1  ? ?Pt received sitting in WC and agreeable to PT. Pt performed sit<>stand transfer with rollator and no assist from PT. ? ?Gait training with UE support on WC handles x 2016fwith cues for direction and safety through doorway management. Gait training with rollator in orthogym x 75 with cues for AD parts management for safety when transitioning to sitting for seated rest break. Additional gait training with rollator x [id number] supervision assist with cues for parts management only .  ? ?6 Min Walk Test:  ?Instructed patient to ambulate as quickly and as safely as possible for 6 minutes using LRAD. Patient was allowed to take standing rest breaks without stopping the test, but if the patient required a sitting rest break the clock would be stopped and the test would be over.  ?Results: 1002 31  using a Rollator with supervision assist. Results indicate that the patient has reduced endurance with ambulation compared to age matched norms.  ?Age Matched Norms: 60-6965-69: 572 F5738, 467933-79: 527 F2071, 80-8959-89: 417 F: 392 ?MDC: 58.21 meters (190.98 feet)  or 50 meters ?(ANPTA Core Set of Outcome Measures for Adults with Neurologic Conditions, 2018) ? ? ?Dynamic balance training on Wii fit. Table tilt, penguin slide. Each performed x 2 with min assist from PT initially for improved use of ankle strategy to control COM. ? ?Patient returned to room and performed stand pivot to recliner with rollator and no assist from PT. Pt left sitting in recliner with call Braddy in reach and all needs met.   ? ? ?Session 2.  ? ?Pt received sitting in WC and agreeable to PT. Pt performed sit<>stand transfer with Rollatorwithout cues or assist from PT.  ? ?Gait training with Rollator through hall x 225ft,48fft, 50f140ft. N56fes or assist from PT throughout session ? ?Stair management training with BUE support x 8 with supervision assist and cues for step to gait pattern to reduce fall rist.  ? ?Pt performed picking object up from floor with UE support on rollator and supervision assist fr omPT ? ?PT instructed pt in modified [place] level A balance program with hand out provided. Cues for use of proper use of BUE on solid surface to reduce fall rist.  ? ?Patient returned to room and performed stand pivot to recliner with rollator and no assist from PT. Pt left sitting in recliner with call [place] in reach and all needs met.   ? ? ? ? 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 ?  ?Vital Signs: ?Therapy Vitals ?Temp: 98.1 ?F (36.7 ?C) ?Temp Source: Oral ?Pulse Rate: 91 ?Resp: 18 ?Patient Position (if appropriate): Sitting ?Oxygen Therapy ?SpO2: 99 % ?O2 Device: Room Air ?Pain: ?Pain Assessment ?Pain Scale: 0-10 ?Pain Score: 0-No pain ? ? ? ? ?Therapy/Group: Individual Therapy ? ?Lorie Phenix ?09/25/2021, 11:03 AM  ?

## 2021-09-25 NOTE — Progress Notes (Addendum)
Patient ID: [name], female   DOB: 14-Aug-1970, 51 y.o.   MRN: [id number] ?  ? ? Advanced Heart Failure Rounding Note ? ?PCP-Cardiologist: [name], MD  ? ?Subjective:   ? ?K back to normal today, 4.1 ? ?Volume stable. No dyspnea ? ?INR 3.2  ? ?MAPs 90s. Remains on midodrine 5 tid  ? ?Making good progress w/ therapy. Hoping she will be home by Easter Sunday.  ? ?LVAD Interrogation HM 3: ?Speed: 5800 Flow: 4.9 PI: 3.2 Power: 4.6  ? ? ?Objective:   ?Weight Range: ?98.1 kg ?Body mass index is 38.32 kg/m?.  ? ?Vital Signs:   ?Temp:  [98 ?F (36.7 ?C)-98.8 ?F (37.1 ?C)] 98.8 ?F (37.1 ?C) (04/05 0544) ?Pulse Rate:  [79-89] 80 (04/05 0544) ?Resp:  [14-18] 14 (04/05 0544) ?BP: (95-102)/(75-84) 102/84 (04/05 0544) ?SpO2:  [100 %] 100 % (04/05 0544) ?Last BM Date : 09/24/21 ? ?Weight change: ?Filed Weights  ? 09/20/21 1555 09/22/21 0914 09/24/21 0500  ?Weight: 100.9 kg 99.3 kg 98.1 kg  ? ? ?Intake/Output:  ? ?Intake/Output Summary (Last 24 hours) at 09/25/2021 0905 ?Last data filed at 09/24/2021 2100 ?Gross per 24 hour  ?Intake 508 ml  ?Output --  ?Net 508 ml  ?  ? ? ?Physical Exam  ? ?GENERAL: Well appearing. Sitting up in chair. No acute distress. ?HEENT: + [place] ?NECK: Supple, JVD flat 2+ bilaterally, no bruits.  No lymphadenopathy or thyromegaly appreciated.   ?CARDIAC:  Mechanical heart sounds with LVAD hum present.  ?LUNGS:  CTAB   ?ABDOMEN:  Soft, round, nontender, positive bowel sounds x4.     ?LVAD exit site:  Dressing dry and intact.  No erythema or drainage.  Stabilization device present and accurately applied.  Driveline dressing is being changed daily per sterile technique. ?EXTREMITIES:  Warm and dry, no cyanosis, clubbing or rash, trace b/l LE  ?NEUROLOGIC:  Alert and oriented x 3.    No aphasia.  No dysarthria.  Affect pleasant.    ? ? ?Labs  ?  ?CBC ?Recent Labs  ?  09/23/21 ?0825 09/24/21 ?0932 09/25/21 ?0418  ?WBC 9.3 8.2 9.0  ?NEUTROABS 7.5  --   --   ?HGB 9.6* 9.0* 9.3*  ?HCT 32.8* 31.3* 32.5*  ?MCV  95.6 96.3 96.2  ?PLT 259 250 266  ? ?Basic Metabolic Panel ?Recent Labs  ?  09/24/21 ?6712 09/24/21 ?1525 09/25/21 ?0418  ?NA 143  --  139  ?K 2.9* 4.2 4.1  ?CL 104  --  104  ?CO2 32  --  29  ?GLUCOSE 86  --  80  ?BUN 19  --  17  ?CREATININE 0.83  --  0.88  ?CALCIUM 8.9  --  9.1  ?MG 1.9  --   --   ? ?Liver Function Tests ?Recent Labs  ?  09/23/21 ?0825 09/24/21 ?0440  ?AST 79* 40  ?ALT 129* 92*  ?ALKPHOS 228* 202*  ?BILITOT 0.4 0.5  ?PROT 5.6* 5.5*  ?ALBUMIN 2.6* 2.5*  ? ?No results for input(s): LIPASE, AMYLASE in the last 72 hours. ?Cardiac Enzymes ?No results for input(s): CKTOTAL, CKMB, CKMBINDEX, TROPONINI in the last 72 hours. ? ?BNP: ?BNP (last 3 results) ?Recent Labs  ?  09/04/21 ?4580 09/10/21 ?9983 09/17/21 ?0140  ?BNP 358.8* 521.3* 279.4*  ? ? ?ProBNP (last 3 results) ?No results for input(s): PROBNP in the last 8760 hours. ? ? ?D-Dimer ?No results for input(s): DDIMER in the last 72 hours. ?Hemoglobin A1C ?No results for input(s): HGBA1C  in the last 72 hours. ?Fasting Lipid Panel ?No results for input(s): CHOL, HDL, LDLCALC, TRIG, CHOLHDL, LDLDIRECT in the last 72 hours. ?Thyroid Function Tests ?No results for input(s): TSH, T4TOTAL, T3FREE, THYROIDAB in the last 72 hours. ? ?Invalid input(s): FREET3 ? ?Other results: ? ? ?Imaging  ? ? ?No results found. ? ? ?Medications:   ? ? ?Scheduled Medications: ? (feeding supplement) PROSource Plus  30 mL Oral BID BM  ? amiodarone  400 mg Oral BID  ? aspirin EC  81 mg Oral Daily  ? Chlorhexidine Gluconate Cloth  6 each Topical Q12H  ? digoxin  0.0625 mg Oral Daily  ? methimazole  10 mg Oral Daily  ? methimazole  5 mg Oral QPM  ? midodrine  5 mg Oral TID WC  ? mirtazapine  7.5 mg Oral QHS  ? pantoprazole  40 mg Oral Daily  ? potassium chloride  60 mEq Oral TID  ? predniSONE  20 mg Oral Q breakfast  ? quiniDINE gluconate  324 mg Oral BID  ? rosuvastatin  40 mg Oral QHS  ? sildenafil  20 mg Oral TID  ? torsemide  40 mg Oral Daily  ? Warfarin - Pharmacist Dosing  Inpatient   Does not apply [id number]  ? ? ?Infusions: ? ? ?PRN Medications: ?ALPRAZolam, gabapentin, loperamide HCl, senna-docusate, sodium chloride, sodium chloride flush, traMADol ? ? ?Assessment/Plan  ? ? 1. Acute on chronic systolic HF: Nonischemic cardiomyopathy.  She has a [employer] ICD. Cause for cardiomyopathy is uncertain, initially identified peri-partum (after son's birth), so cannot rule out peri-partum CMP.  On and off, she has had frequent PVCs.   RHC in 8/15 showed normal filling pressures and preserved cardiac output. Echo in 4/22 with EF 25%, severe LV dilation.  CPX (8/22) with mild-moderate HF limitation.  Echo this admission with EF < 20% with severe dilation, normal RV size with mildly decreased systolic function, moderate-severe functional MR with dilated IVC. She was admitted with volume overload, NYHA class IV symptoms and low output. HCO3 18 and creatinine 1.5 on admit, co-ox 47%. Milrinone 0.25 started + lasix gtt.  I also suspect hyperthyroidism is playing a role in her CHF exacerbation. 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 Impella 5.5 placed 3/9.  Patient had HM3 LVAD placed 3/15. 3/15 Speed increased to 5300 rpm. 3/19 Speed increased to 5400. Ramp echo 3/22, fixed speed increased to 5600.  Ramp echo 3/29, speed increased to 5800. Stable currently, volume status improved and weight has trended down.  BP is difficult to obtain but creatinine stable and no lightheadedness.  ?- Volume status stable. Reduce torsemide to 20 mg daily.  ?- Off spiro with soft BP ?- Continue Digoxin 0.0625 mg. Dig level ok  ?- Continue asa 81 mg daily. Will need long-term with h/o CVA ?- Continue sildenafil 20 mg tid  ?- Warfarin for VAD. INR 3.2 . Dosing per pharmD.  ?- Continue midodrine 5 mg TID. Monitor MAPs may be able to wean soon  ?2. 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  She has been on Levoxyl at home but does not think she has taken for a month.  Now hyperthyroid.  This likely contributes to her CHF exacerbation. [facility] evaluated her for this. 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 Initially treated w/ IV Solumedrol  -> transitioned to prednisone 20 mg on 3/16  and methimazole bid.  ?- per IM, continue prednisone 20 mg x 1 month (4/16), then reduce down to 10 mg until endo follow-up  ?- Will need outpatient endocrinology followup.  ?- Continuing amiodarone for now with NSVT and rapid afib. See below. ?3. PVCs/NSVT: EP consulted given complicated situation with hyperthyroidism and NSVT. They favored continuing amiodarone and quinidine at this time.  Would eventually like to stop amio given hyperthyroidism.  ?- EKG 4/2 in SR no PVCs.  ?4. CVA: CVA in 10/21, had thrombectomy with stent placement.  No LV thrombus noted and no atrial fibrillation noted.  ?- Talked with interventional radiology. >1 year post-stent, no longer on ticagrelor and continue ASA.  ?5. Anemia: Post-op with chest tube output.  3/15, 3/16 1UPRBCs.  ?- Transfuse hgb < 8.  ?- Hgb stable 9.3 today ?6. CKD stage 3: Renal function stable ?7. Mitral regurgitation: Moderate-severe functional MR.  Unlikely to be Mitraclip candidate with severely dilated LV (>7 cm). Hopefully will improve with LVAD.  ?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  Unable to get MRI with ICD. Seen by GYN. Felt to be most likely non-neoplastic. Tumor markers within normal range. Possible low grade malignancy, could also be benign.  No ascites noted on abdominal CT and no mention of abdominal spread.  She is not a surgical candidate at this time with low output HF.  ?- Plan at this point would be to stabilize with LVAD then investigate the ovarian mass.  ?- F/u with GYN as outpatient ?9. Elevated WBCs: Thought to be  due to prednisone. Now resolved, WBC 8.2  ?10. Malnutrition: Appetite improving.  ?- Remeron added to stimulate appetite  ?- CorTrak removed  ?11. VT:  Paced out on 3/16.   ?- Continue amiodarone and quinidine.  ?

## 2021-09-25 NOTE — Plan of Care (Signed)
?  Problem: RH Toileting ?Goal: LTG Patient will perform toileting task (3/3 steps) with assistance level (OT) ?Description: LTG: Patient will perform toileting task (3/3 steps) with assistance level (OT)  ?Outcome: Not Met (add Reason) ?Flowsheets (Taken 09/25/2021 1457) ?LTG: Pt will perform toileting task (3/3 steps) with assistance level: (Pt continues to require min to mod A for posterior pericare due to sternal precautions.) -- ?  ?Problem: RH Balance ?Goal: LTG Patient will maintain dynamic standing with ADLs (OT) ?Description: LTG:  Patient will maintain dynamic standing balance with assist during activities of daily living (OT)  ?Outcome: Completed/Met ?  ?Problem: Sit to Stand ?Goal: LTG:  Patient will perform sit to stand in prep for activites of daily living with assistance level (OT) ?Description: LTG:  Patient will perform sit to stand in prep for activites of daily living with assistance level (OT) ?Outcome: Completed/Met ?  ?Problem: RH Grooming ?Goal: LTG Patient will perform grooming w/assist,cues/equip (OT) ?Description: LTG: Patient will perform grooming with assist, with/without cues using equipment (OT) ?Outcome: Completed/Met ?  ?Problem: RH Bathing ?Goal: LTG Patient will bathe all body parts with assist levels (OT) ?Description: LTG: Patient will bathe all body parts with assist levels (OT) ?Outcome: Completed/Met ?  ?Problem: RH Dressing ?Goal: LTG Patient will perform upper body dressing (OT) ?Description: LTG Patient will perform upper body dressing with assist, with/without cues (OT). ?Outcome: Completed/Met ?Goal: LTG Patient will perform lower body dressing w/assist (OT) ?Description: LTG: Patient will perform lower body dressing with assist, with/without cues in positioning using equipment (OT) ?Outcome: Completed/Met ?  ?Problem: RH Simple Meal Prep ?Goal: LTG Patient will perform simple meal prep w/assist (OT) ?Description: LTG: Patient will perform simple meal prep with assistance,  with/without cues (OT). ?Outcome: Completed/Met ?  ?Problem: RH Toilet Transfers ?Goal: LTG Patient will perform toilet transfers w/assist (OT) ?Description: LTG: Patient will perform toilet transfers with assist, with/without cues using equipment (OT) ?Outcome: Completed/Met ?  ?Problem: RH Furniture Transfers ?Goal: LTG Patient will perform furniture transfers w/assist (OT/PT) ?Description: LTG: Patient will perform furniture transfers  with assistance (OT/PT). ?Outcome: Completed/Met ?  ?

## 2021-09-25 NOTE — Progress Notes (Signed)
Occupational Therapy Discharge Summary ? ?Patient Details  ?Name: [name] ?MRN: [id number] ?Date of Birth: [birth year] ? ? ?Patient has met 9 of 10 long term goals due to improved activity tolerance, improved balance, and ability to compensate for deficits.  Patient to discharge at overall  S to mod I  level.  Patient's care partner is independent to provide the necessary physical assistance at discharge.  Pt to DC at the below ADL/bathroom transfer performance level. Assist levels represent pt's most consistent performance when alert/participatory. Pt continues to be primarily limited by decreased activity tolerance and sternal precaution limitations. Pt and caregivers will benefit from continued OP OT to facilitate improved caregiver education, functional mobility, and occupational performance.  ? ? ?Reasons goals not met: Pt continues to require min to mod A for posterior pericare post BM due to sternal precautions, educated pt on AE options to facilitate independence with hygiene. ? ?Recommendation:  ?Patient will benefit from ongoing skilled OT services in outpatient setting to continue to advance functional skills in the area of BADL, iADL, and Reduce care partner burden. ? ?Equipment: ?3in1 ? ?Reasons for discharge: treatment goals met and discharge from hospital ? ?Patient/family agrees with progress made and goals achieved: Yes ? ?OT Discharge ?Precautions/Restrictions  ?Precautions ?Precautions: Fall;Sternal ?Precaution Comments: recent LVAD,  hx of L MCA CVA with residual R sided weakness ?Restrictions ?Weight Bearing Restrictions: No ? ? ?ADL ?ADL ?Eating: Independent ?Where Assessed-Eating: Wheelchair ?Grooming: Modified independent ?Where Assessed-Grooming: Sitting at sink, Standing at sink ?Upper Body Bathing: Modified independent ?Where Assessed-Upper Body Bathing: Sitting at sink ?Lower Body Bathing: Modified independent ?Where Assessed-Lower Body Bathing: Standing at sink ?Upper Body Dressing:  Independent ?Where Assessed-Upper Body Dressing: Sitting at sink ?Lower Body Dressing: Supervision/safety ?Where Assessed-Lower Body Dressing: Standing at sink, Sitting at sink ?Toileting: Moderate assistance, Minimal assistance ?Where Assessed-Toileting: Toilet, Bedside Commode ?Toilet Transfer: Modified independent ?Toilet Transfer Method: Ambulating ?Science writer: Grab bars, Bedside commode ?Tub/Shower Transfer: Not assessed ?Walk-In Shower Transfer: Not assessed ?Vision ?Baseline Vision/History: 1 Wears glasses (readers) ?Patient Visual Report: No change from baseline ?Vision Assessment?: No apparent visual deficits ?Perception  ?Perception: Within Functional Limits ?Praxis ?Praxis: Intact ?Cognition ?Cognition ?Overall Cognitive Status: Within Functional Limits for tasks assessed ?Arousal/Alertness: Awake/alert ?Orientation Level: Person;Place;Situation ?Person: Oriented ?Place: Oriented ?Situation: Oriented ?Memory: Appears intact ?Attention: Divided ?Divided Attention: Appears intact ?Awareness: Appears intact ?Problem Solving: Appears intact ?Safety/Judgment: Appears intact ?Comments: able to recall and adhere to sternal precautions, general LVAD care ?Brief Interview for Mental Status (BIMS) ?Repetition of Three Words (First Attempt): 3 ?Temporal Orientation: Year: Correct ?Temporal Orientation: Month: Accurate within 5 days ?Temporal Orientation: Day: Incorrect ?Recall: "Sock": Yes, no cue required ?Recall: "Blue": Yes, no cue required ?Recall: "Bed": Yes, no cue required ?BIMS Summary Score: 14 ?Sensation ?Sensation ?Light Touch: Appears Intact ?Hot/Cold: Appears Intact ?Proprioception: Appears Intact ?Stereognosis: Appears Intact ?Coordination ?Gross Motor Movements are Fluid and Coordinated: Yes ?Fine Motor Movements are Fluid and Coordinated:  (grossly WFL, limited by bruising on L digits) ?Coordination and Movement Description: mild residual R sided hemipareisis, generalized  deconditioning ?Motor  ?Motor ?Motor: Other (comment) (residual R HP) ?Motor - Discharge Observations: residual R sided hemipareisis, generalized deconditioning but moderately improved since eval ?Mobility  ?Bed Mobility ?Bed Mobility: Supine to Sit;Sit to Supine ?Supine to Sit: Independent with assistive device ?Sit to Supine: Independent with assistive device ?Transfers ?Sit to Stand: Independent with assistive device ?Stand to Sit: Independent with assistive device  ?Trunk/Postural Assessment  ?Cervical Assessment ?Cervical Assessment: Within  Functional Limits ?Thoracic Assessment ?Thoracic Assessment: Exceptions to [facility] (rounded shoulders) ?Lumbar Assessment ?Lumbar Assessment: Exceptions to [facility] (posterior pelvic tilt) ?Postural Control ?Postural Control: Within Functional Limits  ?Balance ?Balance ?Balance Assessed: Yes ?Static Sitting Balance ?Static Sitting - Balance Support: Feet supported ?Static Sitting - Level of Assistance: 6: Modified independent (Device/Increase time) ?Dynamic Sitting Balance ?Dynamic Sitting - Balance Support: Feet supported;During functional activity ?Dynamic Sitting - Level of Assistance: 6: Modified independent (Device/Increase time) ?Static Standing Balance ?Static Standing - Balance Support: Bilateral upper extremity supported;During functional activity ?Static Standing - Level of Assistance: 6: Modified independent (Device/Increase time) ?Dynamic Standing Balance ?Dynamic Standing - Balance Support: During functional activity ?Dynamic Standing - Level of Assistance: 6: Modified independent (Device/Increase time) ?Extremity/Trunk Assessment ?RUE Assessment ?RUE Assessment: Within Functional Limits ?General Strength Comments: mild R HP from previous CVA, grossly WFL for ADL ?LUE Assessment ?LUE Assessment: Within Functional Limits ?General Strength Comments: limited [place] due to bruising on L digits, grossly WFL for ADL ? ? ?Volanda Napoleon MS, OTR/L ? ?09/25/2021, 7:26 AM ?

## 2021-09-25 NOTE — Progress Notes (Signed)
Occupational Therapy Session Note ? ?Patient Details  ?Name: [name] ?MRN: [id number] ?Date of Birth: 20-Nov-1970 ? ?Today's Date: 09/25/2021 ?OT Individual Time: 0702-0802 ?OT Individual Time Calculation (min): 60 min  ? ? ?Short Term Goals: ?Week 1:  OT Short Term Goal 1 (Week 1): STG = LTG 2/2 ELOS ? ?Skilled Therapeutic Interventions/Progress Updates:  ?  Pt received semi-reclined in bed, no c/o pain, agreeable to therapy. Session focus on self-care retraining, activity tolerance, transfer retraining in prep for improved ADL/IADL/func mobility performance + decreased caregiver burden. Mod I bed mobility to come EOB. Sit to stand with distant S and short ambulatory transfer > w/c at sink with no AD.  ? ?Completed UB bathing/dressing seated at sink set-up A and increased time due to fatigue. Ambulated > toilet with distant S and no AD. Completed LB clothing management close S and increased time, cues to not place BUE behind back at same time to adhere to sternal precautions. Continent void of bowel, required max A for thoroughness of pericare. Issued toilet tongs and discussed various options to increase ind with pericare (using foot stool, reaching in between legs, bidet, post partum pericare bottle, etc.)  ? ?Pt left seated EOB with call Cales in reach, and all immediate needs met.  ? ? 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 ?Pain: no c/o ?  ?ADL: See Care Tool for more details. ? ?Therapy/Group: Individual Therapy ? ?Volanda Napoleon MS, OTR/L ? ?09/25/2021, 6:51 AM ?

## 2021-09-25 NOTE — Patient Care Conference (Signed)
Inpatient RehabilitationTeam Conference and Plan of Care Update ?Date: 09/25/2021   Time: 10:11 AM  ? ? ?Patient Name: [name]      ?Medical Record Number: [id number]  ?Date of Birth: 1971/02/14 ?Sex: Female         ?Room/Bed: 6E83T/5V76H-60 ?Payor Info: Payor: Teacher, music / Plan: BCBS COMM PPO / Product Type: *No Product type* /   ? ?Admit Date/Time:  09/20/2021  3:22 PM ? ?Primary Diagnosis:  Debility ? ?Hospital Problems: Principal Problem: ?  Debility ?Active Problems: ?  LVAD (left ventricular assist device) present ([facility]) ?  Hypokalemia ?  Paroxysmal atrial fibrillation (HCC) ?  Transaminitis ? ? ? ?Expected Discharge Date: Expected Discharge Date: 09/26/21 ? ?Team Members Present: ?Physician leading conference: Dr. Alysia Penna ?Social Worker Present: Erlene Quan, BSW ?Nurse Present: Dorien Chihuahua, RN ?PT Present: Barrie Folk, PT ?OT Present: Providence Lanius, OT ?SLP Present: Sherren Kerns, SLP ?PPS Coordinator present : Gunnar Fusi, SLP ? ?   Current Status/Progress Goal Weekly Team Focus  ?Bowel/Bladder ? ?   Continent        ?Swallow/Nutrition/ Hydration ? ?           ?ADL's ? ? S bathroom transfers no AD, fatigues quickly, min A for UBD/LBD, otherwise set-up A to mod I for seated bathing/grooming/managing LVAD  S IADL, mod I BADL  self-care/balance/transfer retraining, energy conservation, activity tolerance, fam/pt education, DME/AE use   ?Mobility ? ? supervision/ CGA for bed mobility, supervision for transfers, supervision for ambulation, climbs 8 steps in one bout  overall Mod I/ supervision  standing balance, R NMR, activity tolerance, general strengthening, stair training, gait training, family education   ?Communication ? ?           ?Safety/Cognition/ Behavioral Observations ?           ?Pain ? ?   Minimal incisional pain        ?Skin ? ?   LVAD dressing/sternal incisionw skin glue        ? ? ?Discharge Planning:  ?Discharging home with spouse and 2 minor children (11  and 13). Spouse plans on taking FMLA to provide 24/7   ?Team Discussion: ?Patient doing well overall and on target for discharge. ? ?Patient on target to meet rehab goals: ?yes, currently needs supervision for toileting without a device. Able to maintain sternal precautions and notes spouse able to complete drive line dressing changes. Progress limited by fatigue; however on target to med mod I goals and supervision for steps. ? ?*See Care Plan and progress notes for long and short-term goals.  ? ?Revisions to Treatment Plan:  ?N/A ?  ?Teaching Needs: ?Safety, skin care, LVAD management, medications, dietary modifications, etc.  ?Current Barriers to Discharge: ?Home enviroment access/layout ? ?Possible Resolutions to Barriers: ?Recommend railing for steps ?DME: Rollator ?OP follow up services ?  ? ? Medical Summary ?Current Status: Cont Bowel adn bladder , skin is healing well.  Hard to obtain BP, still on sternal prec ? Barriers to Discharge: Weight bearing restrictions ? Barriers to Discharge Comments: Sternal precautions, E lyte disturbance ?Possible Resolutions to [employer] Focus: Monitor potassium levels, cont supplementation, cardiology to manage LVAD/Heart failure ? ? ?Continued Need for Acute Rehabilitation Level of Care: The patient requires daily medical management by a physician with specialized training in physical medicine and rehabilitation for the following reasons: ?Direction of a multidisciplinary physical rehabilitation program to maximize functional independence : Yes ?Medical management of patient stability for increased  activity during participation in an intensive rehabilitation regime.: Yes ?Analysis of laboratory values and/or radiology reports with any subsequent need for medication adjustment and/or medical intervention. : Yes ? ? ?I attest that I was present, lead the team conference, and concur with the assessment and plan of the team. ? ? ?Dorien Chihuahua B ?09/25/2021, 1:34 PM   ? ? ? ? ? ? ?

## 2021-09-26 ENCOUNTER — Ambulatory Visit: Payer: Medicare Other | Admitting: Adult Health

## 2021-09-26 ENCOUNTER — Other Ambulatory Visit (HOSPITAL_COMMUNITY): Payer: Self-pay

## 2021-09-26 LAB — BASIC METABOLIC PANEL
Anion gap: 9 (ref 5–15)
BUN: 14 mg/dL (ref 6–20)
CO2: 25 mmol/L (ref 22–32)
Calcium: 9.1 mg/dL (ref 8.9–10.3)
Chloride: 106 mmol/L (ref 98–111)
Creatinine, Ser: 0.87 mg/dL (ref 0.44–1.00)
GFR, Estimated: >60 mL/min (ref >60)
Glucose, Bld: 87 mg/dL (ref 70–99)
Potassium: 4.4 mmol/L (ref 3.5–5.1)
Sodium: 140 mmol/L (ref 135–145)

## 2021-09-26 LAB — CBC
HCT: 32.8 % — ABNORMAL LOW (ref 36.0–46.0)
Hemoglobin: 9.6 g/dL — ABNORMAL LOW (ref 12.0–15.0)
MCH: 27.7 pg (ref 26.0–34.0)
MCHC: 29.3 g/dL — ABNORMAL LOW (ref 30.0–36.0)
MCV: 94.8 fL (ref 80.0–100.0)
Platelets: 259 10*3/uL (ref 150–400)
RBC: 3.46 MIL/uL — ABNORMAL LOW (ref 3.87–5.11)
RDW: 20.5 % — ABNORMAL HIGH (ref 11.5–15.5)
WBC: 9 10*3/uL (ref 4.0–10.5)
nRBC: 0 % (ref 0.0–0.2)

## 2021-09-26 LAB — LACTATE DEHYDROGENASE: LDH: 316 U/L — ABNORMAL HIGH (ref 98–192)

## 2021-09-26 LAB — PROTIME-INR
INR: 2.7 — ABNORMAL HIGH (ref 0.8–1.2)
Prothrombin Time: 28.8 seconds — ABNORMAL HIGH (ref 11.4–15.2)

## 2021-09-26 MED ORDER — POTASSIUM CHLORIDE CRYS ER 20 MEQ PO TBCR
20.0000 meq | EXTENDED_RELEASE_TABLET | Freq: Every day | ORAL | 0 refills | Status: DC
Start: 2021-09-26 — End: 2021-09-26
  Filled 2021-09-26: qty 30, 30d supply, fill #0

## 2021-09-26 MED ORDER — POTASSIUM CHLORIDE CRYS ER 20 MEQ PO TBCR
60.0000 meq | EXTENDED_RELEASE_TABLET | Freq: Two times a day (BID) | ORAL | 0 refills | Status: DC
  Filled 2021-09-26: qty 180, 30d supply, fill #0

## 2021-09-26 MED ORDER — WARFARIN 0.5 MG HALF TABLET
0.5000 mg | ORAL_TABLET | Freq: Every day | ORAL | Status: DC
Start: 2021-09-26 — End: 2021-09-26

## 2021-09-26 MED ORDER — WARFARIN SODIUM 1 MG PO TABS
ORAL_TABLET | ORAL | 11 refills | Status: DC
  Filled 2021-09-26: qty 15, 30d supply, fill #0

## 2021-09-26 MED ORDER — AMIODARONE HCL 200 MG PO TABS
ORAL_TABLET | ORAL | 0 refills | Status: DC
  Filled 2021-09-26: qty 60, 30d supply, fill #0

## 2021-09-26 NOTE — Progress Notes (Signed)
LVAD Coordinator Rounding Note: ? ?Admitted 09/20/21 to CIR post LVAD implant due to elevated BMI.  ? ?Patient sitting in the bed ready to go home. All home equipment has been delivered to the pts room today. Hospital f/u appts made.  ? ?Dressing changed today. See documentation below.  ? ?Vital signs: ?Temp: 98.5 ?HR: 80 ?Doppler Pressure: not documented ?Auto BP:  95/71(80) ?O2 Sat: 100% on RA  ?Wt:  222.4>218.9>216.3>217.8 lbs ? ?LVAD interrogation:  ?Speed: 5800 ?Flow: 4.9 ?Power: 4.6w ?PI: 3.3 ?Hct: 31 ? ?Alarms: none ?Events: none ?Hematocrit: 31 ? ?Fixed speed: 5800 ?Low speed limit: 5500 ? ?Drive Line: Existing VAD dressing removed and site care performed using sterile technique. Drive line exit site cleaned with Chlora prep applicators x 2, RINSED WITH SALINE, allowed to dry, and gauze dressing with Silver strip applied. Exit site healing and unincorporated, the velour is fully implanted at exit site. Large amount of serous drainage noted.  No redness, tenderness, foul odor or rash noted. Drive line anchor secure. Pt will need daily dressing changes using daily kit per VAD coordinator or caregiver, [name]. Next dressing change due: 09/27/21.  ? ? ? ? ? ?Labs:  ?LDH trend:  490>450>373>361>300>313 >316 ? ?INR trend:  2.4>2.8>3.1>3.1>3.2>3.2>2.7 ? ? ?Anticoagulation Plan: ?-INR Goal: 2.0 - 2.5 ?-ASA Dose: 81 mg  ? ?Device: Medtronic single ICD ?- Therapies: on 200 bpm ?- Pacing:  VVI 40 ?-  Last check 09/18/21 (therapy turned on after surgery/transfer to [facility]) ?        ? ?Plan/Recommendations:  ?1. Page VAD coordinators with VAD equipment and drive line concerns. ?2. Daily drive line dressing change per VAD coordinator or caregiver. Next dressing change due 09/27/21. ?3. Pt ok to d/c home ? ?Tanda Rockers RN ?VAD Coordinator  ?Office: [phone]  ?24/7 Pager: [phone]  ? ? ? ?  ?

## 2021-09-26 NOTE — Progress Notes (Signed)
Patient discharged off of unit with all belongings and home medication from pharmacy. Discharge papers/instructions explained by physician assistant to family. Patient and family have no further questions at time of discharge. No complications noted at this time.  ?[name]   ?

## 2021-09-26 NOTE — Progress Notes (Addendum)
Patient ID: [name], female   DOB: [birth year], 51 y.o.   MRN: [id number] ?  ? ? Advanced Heart Failure Rounding Note ? ?PCP-Cardiologist: [name], MD  ? ?Subjective:   ? ?Wt stable. No dyspnea.  ? ?K stable, 4.4. SCr 0.87  ? ?INR 2.7  ? ?MAPs 80s  ? ?Feels well. Going home today.  ? ? ?LVAD Interrogation HM 3: ?Speed: 5800 Flow: 4.8 PI: 3.1 Power: 4.5. No PI events   ? ? ?Objective:   ?Weight Range: ?98.8 kg ?Body mass index is 38.58 kg/m?.  ? ?Vital Signs:   ?Temp:  [97.8 ?F (36.6 ?C)-98.5 ?F (36.9 ?C)] 98.5 ?F (36.9 ?C) (04/06 1224) ?Pulse Rate:  [80-91] 80 (04/06 0745) ?Resp:  [17-20] 17 (04/06 0537) ?BP: (93-107)/(40-71) 95/71 (04/06 0537) ?SpO2:  [99 %-100 %] 100 % (04/06 0537) ?Weight:  [98.8 kg] 98.8 kg (04/06 0537) ?Last BM Date : 09/25/21 ? ?Weight change: ?Filed Weights  ? 09/22/21 0914 09/24/21 0500 09/26/21 0537  ?Weight: 99.3 kg 98.1 kg 98.8 kg  ? ? ?Intake/Output:  ? ?Intake/Output Summary (Last 24 hours) at 09/26/2021 0750 ?Last data filed at 09/25/2021 2131 ?Gross per 24 hour  ?Intake 1074 ml  ?Output --  ?Net 1074 ml  ?  ? ? ?Physical Exam  ? ?GENERAL: Well appearing. Sitting up in chair. No acute distress. ?HEENT: + [place] ?NECK: Supple, JVD not elevated 2+ bilaterally, no bruits.  No lymphadenopathy or thyromegaly appreciated.   ?CARDIAC:  Mechanical heart sounds with LVAD hum present.  ?LUNGS:  CTAB, no wheezing    ?ABDOMEN:  Soft, round, nontender, positive bowel sounds x4.     ?LVAD exit site:  Dressing dry and intact.  No erythema or drainage.  Stabilization device present and accurately applied.  Driveline dressing is being changed daily per sterile technique. ?EXTREMITIES:  Warm and dry, no cyanosis, clubbing or rash, trace RLEE ?NEUROLOGIC:  Alert and oriented x 3.    No aphasia.  No dysarthria.  Affect pleasant.    ? ? ?Labs  ?  ?CBC ?Recent Labs  ?  09/23/21 ?0825 09/24/21 ?8250 09/25/21 ?0370 09/26/21 ?0321  ?WBC 9.3   < > 9.0 9.0  ?NEUTROABS 7.5  --   --   --   ?HGB 9.6*   < > 9.3*  9.6*  ?HCT 32.8*   < > 32.5* 32.8*  ?MCV 95.6   < > 96.2 94.8  ?PLT 259   < > 266 259  ? < > = values in this interval not displayed.  ? ?Basic Metabolic Panel ?Recent Labs  ?  09/24/21 ?4888 09/24/21 ?1525 09/25/21 ?0418 09/26/21 ?0321  ?NA 143  --  139 140  ?K 2.9*   < > 4.1 4.4  ?CL 104  --  104 106  ?CO2 32  --  29 25  ?GLUCOSE 86  --  80 87  ?BUN 19  --  17 14  ?CREATININE 0.83  --  0.88 0.87  ?CALCIUM 8.9  --  9.1 9.1  ?MG 1.9  --   --   --   ? < > = values in this interval not displayed.  ? ?Liver Function Tests ?Recent Labs  ?  09/23/21 ?0825 09/24/21 ?0440  ?AST 79* 40  ?ALT 129* 92*  ?ALKPHOS 228* 202*  ?BILITOT 0.4 0.5  ?PROT 5.6* 5.5*  ?ALBUMIN 2.6* 2.5*  ? ?No results for input(s): LIPASE, AMYLASE in the last 72 hours. ?Cardiac Enzymes ?No results for input(s): CKTOTAL,  CKMB, CKMBINDEX, TROPONINI in the last 72 hours. ? ?BNP: ?BNP (last 3 results) ?Recent Labs  ?  09/04/21 ?3212 09/10/21 ?2482 09/17/21 ?0140  ?BNP 358.8* 521.3* 279.4*  ? ? ?ProBNP (last 3 results) ?No results for input(s): PROBNP in the last 8760 hours. ? ? ?D-Dimer ?No results for input(s): DDIMER in the last 72 hours. ?Hemoglobin A1C ?No results for input(s): HGBA1C in the last 72 hours. ?Fasting Lipid Panel ?No results for input(s): CHOL, HDL, LDLCALC, TRIG, CHOLHDL, LDLDIRECT in the last 72 hours. ?Thyroid Function Tests ?No results for input(s): TSH, T4TOTAL, T3FREE, THYROIDAB in the last 72 hours. ? ?Invalid input(s): FREET3 ? ?Other results: ? ? ?Imaging  ? ? ?No results found. ? ? ?Medications:   ? ? ?Scheduled Medications: ? (feeding supplement) PROSource Plus  30 mL Oral BID BM  ? amiodarone  400 mg Oral BID  ? aspirin EC  81 mg Oral Daily  ? Chlorhexidine Gluconate Cloth  6 each Topical Q12H  ? digoxin  0.0625 mg Oral Daily  ? methimazole  10 mg Oral Daily  ? methimazole  5 mg Oral QPM  ? midodrine  5 mg Oral TID WC  ? mirtazapine  7.5 mg Oral QHS  ? pantoprazole  40 mg Oral Daily  ? potassium chloride  60 mEq Oral TID  ?  predniSONE  20 mg Oral Q breakfast  ? quiniDINE gluconate  324 mg Oral BID  ? rosuvastatin  40 mg Oral QHS  ? sildenafil  20 mg Oral TID  ? torsemide  20 mg Oral Daily  ? Warfarin - Pharmacist Dosing Inpatient   Does not apply N0037  ? ? ?Infusions: ? ? ?PRN Medications: ?ALPRAZolam, gabapentin, loperamide HCl, senna-docusate, sodium chloride, sodium chloride flush, traMADol ? ? ?Assessment/Plan  ? ? 1. Acute on chronic systolic HF: Nonischemic cardiomyopathy.  She has a Medtronic ICD. Cause for cardiomyopathy is uncertain, initially identified peri-partum (after son's birth), so cannot rule out peri-partum CMP.  On and off, she has had frequent PVCs.   RHC in 8/15 showed normal filling pressures and preserved cardiac output. Echo in 4/22 with EF 25%, severe LV dilation.  CPX (8/22) with mild-moderate HF limitation.  Echo this admission with EF < 20% with severe dilation, normal RV size with mildly decreased systolic function, moderate-severe functional MR with dilated IVC. She was admitted with volume overload, NYHA class IV symptoms and low output. HCO3 18 and creatinine 1.5 on admit, co-ox 47%. Milrinone 0.25 started + lasix gtt.  I also suspect hyperthyroidism is playing a role in her CHF exacerbation. 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 Impella 5.5 placed 3/9.  Patient had HM3 LVAD placed 3/15. 3/15 Speed increased to 5300 rpm. 3/19 Speed increased to 5400. Ramp echo 3/22, fixed speed increased to 5600.  Ramp echo 3/29, speed increased to 5800. Stable currently, volume status improved and weight has trended down.  BP is difficult to obtain but creatinine stable and no lightheadedness.  ?- Volume status stable. Continue torsemide 20 mg daily.  ?- Off spiro with soft BP ?- Continue Digoxin 0.0625 mg. Dig level ok  ?- Continue asa 81 mg daily. Will need long-term with h/o CVA ?- Continue sildenafil 20 mg  tid  ?- Warfarin for VAD. INR 3.2 . Dosing per pharmD.  ?- Continue midodrine 5 mg TID. Monitor MAPs may be able to wean soon  ?2. 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  She has been on Levoxyl at home but does not think she has taken for a month.  Now hyperthyroid.  This likely contributes to her CHF exacerbation. [facility] evaluated her for this. 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 Initially treated w/ IV Solumedrol  -> transitioned to prednisone 20 mg on 3/16 and methimazole bid.  ?- per IM, continue prednisone 20 mg x 1 month (4/16), then reduce down to 10 mg until endo follow-up  ?- Will need outpatient endocrinology followup.  ?- Continuing amiodarone for now with NSVT and rapid afib. See below. ?3. PVCs/NSVT: EP consulted given complicated situation with hyperthyroidism and NSVT. They favored continuing amiodarone and quinidine at this time.  Would eventually like to stop amio given hyperthyroidism.  ?- EKG 4/2 in SR no PVCs.  ?4. CVA: CVA in 10/21, had thrombectomy with stent placement.  No LV thrombus noted and no atrial fibrillation noted.  ?- Talked with interventional radiology. >1 year post-stent, no longer on ticagrelor and continue ASA.  ?5. Anemia: Post-op with chest tube output.  3/15, 3/16 1UPRBCs.  ?- Transfuse hgb < 8.  ?- Hgb stable 9.6 today ?6. CKD stage 3: Renal function stable ?7. Mitral regurgitation: Moderate-severe functional MR.  Unlikely to be Mitraclip candidate with severely dilated LV (>7 cm). Hopefully will improve with LVAD.  ?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  Unable to get MRI with ICD. Seen by GYN. Felt to be most likely non-neoplastic. Tumor markers within normal range. Possible low grade malignancy, could also be benign.  No ascites noted on abdominal CT and no mention of abdominal spread.  She is not a surgical  candidate at this time with low output HF.  ?- Plan at this point would be to stabilize with LVAD then investigate the ovarian mass.  ?- F/u with GYN as outpatient ?9. Elevated WBCs: Thought to be due to pre

## 2021-09-26 NOTE — Plan of Care (Signed)
?  Problem: RH Balance ?Goal: LTG Patient will maintain dynamic standing balance (PT) ?Description: LTG:  Patient will maintain dynamic standing balance with assistance during mobility activities (PT) ?Outcome: Completed/Met ?Flowsheets (Taken 09/26/2021 5035) ?LTG: Pt will maintain dynamic standing balance during mobility activities with:: Independent with assistive device  ?  ?Problem: Sit to Stand ?Goal: LTG:  Patient will perform sit to stand with assistance level (PT) ?Description: LTG:  Patient will perform sit to stand with assistance level (PT) ?Outcome: Completed/Met ?Flowsheets (Taken 09/26/2021 4656) ?LTG: PT will perform sit to stand in preparation for functional mobility with assistance level: Independent ?  ?Problem: RH Bed Mobility ?Goal: LTG Patient will perform bed mobility with assist (PT) ?Description: LTG: Patient will perform bed mobility with assistance, with/without cues (PT). ?Outcome: Completed/Met ?Flowsheets (Taken 09/26/2021 8127) ?LTG: Pt will perform bed mobility with assistance level of: Set up assist  ?  ?Problem: RH Bed to Chair Transfers ?Goal: LTG Patient will perform bed/chair transfers w/assist (PT) ?Description: LTG: Patient will perform bed to chair transfers with assistance (PT). ?Outcome: Completed/Met ?Flowsheets (Taken 09/26/2021 5170) ?LTG: Pt will perform Bed to Chair Transfers with assistance level: Independent with assistive device  ?  ?Problem: RH Car Transfers ?Goal: LTG Patient will perform car transfers with assist (PT) ?Description: LTG: Patient will perform car transfers with assistance (PT). ?Outcome: Completed/Met ?Flowsheets (Taken 09/21/2021 1724) ?LTG: Pt will perform car transfers with assist:: Supervision/Verbal cueing ?  ?Problem: RH Ambulation ?Goal: LTG Patient will ambulate in home environment (PT) ?Description: LTG: Patient will ambulate in home environment, # of feet with assistance (PT). ?Outcome: Completed/Met ?Flowsheets ?Taken 09/26/2021 0824 ?LTG: Ambulation  distance in home environment: more than 50 ft with or without LRAD ?Taken 09/21/2021 1724 ?LTG: Pt will ambulate in home environ  assist needed:: Supervision/Verbal cueing ?Goal: LTG Patient will ambulate in community environment (PT) ?Description: LTG: Patient will ambulate in community environment, # of feet with assistance (PT). ?Outcome: Completed/Met ?Flowsheets ?Taken 09/26/2021 0824 ?LTG: Ambulation distance in community environment: >400 ft using rollator ?Taken 09/21/2021 1724 ?LTG: Pt will ambulate in community environ  assist needed:: Supervision/Verbal cueing ?  ?Problem: RH Stairs ?Goal: LTG Patient will ambulate up and down stairs w/assist (PT) ?Description: LTG: Patient will ambulate up and down # of stairs with assistance (PT) ?Outcome: Completed/Met ?Flowsheets (Taken 09/26/2021 0174) ?LTG: Pt will ambulate up/down stairs assist needed:: Supervision/Verbal cueing ?LTG: Pt will  ambulate up and down number of stairs: 8 steps with BHR ?  ?

## 2021-09-27 ENCOUNTER — Other Ambulatory Visit (HOSPITAL_COMMUNITY): Payer: Self-pay | Admitting: *Deleted

## 2021-09-27 DIAGNOSIS — Z7901 Long term (current) use of anticoagulants: Secondary | ICD-10-CM

## 2021-09-27 DIAGNOSIS — Z95811 Presence of heart assist device: Secondary | ICD-10-CM

## 2021-09-30 ENCOUNTER — Ambulatory Visit (INDEPENDENT_AMBULATORY_CARE_PROVIDER_SITE_OTHER): Payer: BC Managed Care – PPO

## 2021-09-30 DIAGNOSIS — Z95810 Presence of automatic (implantable) cardiac defibrillator: Secondary | ICD-10-CM

## 2021-09-30 DIAGNOSIS — I5022 Chronic systolic (congestive) heart failure: Secondary | ICD-10-CM

## 2021-09-30 NOTE — Progress Notes (Signed)
EPIC Encounter for ICM Monitoring ? ?Patient Name: [name] is a 51 y.o. female ?Date: 09/30/2021 ?Primary Care Physican: Patient, No Pcp Per (Inactive) ?Primary Cardiologist: [name] ?Electrophysiologist: [name] ?06/11/2021 Weight: 234 lbs ?07/09/2021 Weight: 234 lbs ?07/30/2021 Weight: 225-227 lbs (baseline since being diuresed) ?08/20/2021 Weight: 228 lbs ?09/30/2021 Weight: 215 lbs ?  ?  ?Spoke with patient and heart failure questions reviewed.  Weight is stable at 215 lbs post 4/6 hospital discharge but feet remain swollen.  She still feels like she has fluid.  Her condition, at the beginning of March, deteriorated and had LVAD procedure 3/14.  Discharged from hospital 4/6.   ?  ?Optivol Thoracic impedance suggesting possible fluid accumulation starting 3/12 which correlates with hospitalization.  Fluid index > normal threshold starting 09/08/2021. ?  ?Prescribed:  ?Torsemide 20 mg Take 1 tablet (20 mg total) by mouth daily. ?Potassium 20 mEq Take 3 tablets (60 mEq total) by mouth twice a day  ?  ?Labs: ?10/02/2021 BMET Scheduled ?09/26/2021 Creatinine 0.87, BUN 14, Potassium 4.4, Sodium 140, GFR >60  ?09/25/2021 Creatinine 0.88, BUN 17, Potassium 4.1, Sodium 139, GFR >60  ?09/24/2021 Potassium 4.2 at 3:25 PM  ?09/24/2021 Creatinine 0.83, BUN 19, Potassium 2.9, Sodium 143, GFR >60 at 4:40 AM  ?09/23/2021 Creatinine 0.84, BUN 28, Potassium 3.7, Sodium 139, GFR >60  ?09/22/2021 Creatinine 0.92, BUN 28, Potassium 3.0 (3.5 at 8:11 PM), Sodium 145, GFR >60  ?09/21/2021 Creatinine 1.06, BUN 32, Potassium 3.6, Sodium 143, GFR >60  ?09/20/2021 Creatinine 0.83, BUN 32, Potassium 3.7, Sodium 142, GFR >60 ?A complete set of results can be found in Results Review. ?  ?Recommendations:  Copy sent to Dr. [name] for review and recommendations if needed.   ?  ?Follow-up plan: ICM clinic phone appointment on 10/07/2021 to recheck fluid levels.   91 day device clinic remote transmission 12/16/2021.       ?  ?EP/Cardiology Office  Visits: 10/07/2021 with LVAD clinic.   11/26/2021 with Dr [name] ?  ?Copy of ICM check sent to Dr. [name].    ? ?3 month ICM trend: 09/30/2021. ? ? ? ?12-14 Month ICM trend:  ? ? ? ?[name], RN ?09/30/2021 ?1:18 PM ? ?

## 2021-10-01 DIAGNOSIS — G4733 Obstructive sleep apnea (adult) (pediatric): Secondary | ICD-10-CM | POA: Diagnosis not present

## 2021-10-02 ENCOUNTER — Ambulatory Visit (HOSPITAL_COMMUNITY): Payer: Self-pay | Admitting: Pharmacist

## 2021-10-02 ENCOUNTER — Ambulatory Visit (HOSPITAL_COMMUNITY)
Admission: RE | Admit: 2021-10-02 | Discharge: 2021-10-02 | Disposition: A | Discharge status: Home / Self Care | Payer: BC Managed Care – PPO | Source: Ambulatory Visit | Attending: Internal Medicine | Admitting: Internal Medicine

## 2021-10-02 DIAGNOSIS — Z452 Encounter for adjustment and management of vascular access device: Secondary | ICD-10-CM | POA: Insufficient documentation

## 2021-10-02 DIAGNOSIS — Z7901 Long term (current) use of anticoagulants: Secondary | ICD-10-CM | POA: Insufficient documentation

## 2021-10-02 DIAGNOSIS — Z95811 Presence of heart assist device: Secondary | ICD-10-CM | POA: Diagnosis not present

## 2021-10-02 LAB — BASIC METABOLIC PANEL
Anion gap: 9 (ref 5–15)
BUN: 16 mg/dL (ref 6–20)
CO2: 26 mmol/L (ref 22–32)
Calcium: 9.6 mg/dL (ref 8.9–10.3)
Chloride: 106 mmol/L (ref 98–111)
Creatinine, Ser: 0.87 mg/dL (ref 0.44–1.00)
GFR, Estimated: >60 mL/min (ref >60)
Glucose, Bld: 105 mg/dL — ABNORMAL HIGH (ref 70–99)
Potassium: 4.4 mmol/L (ref 3.5–5.1)
Sodium: 141 mmol/L (ref 135–145)

## 2021-10-02 LAB — PROTIME-INR
INR: 1.2 (ref 0.8–1.2)
Prothrombin Time: 14.6 seconds (ref 11.4–15.2)

## 2021-10-02 MED ORDER — ENOXAPARIN SODIUM 40 MG/0.4ML IJ SOSY
40.0000 mg | PREFILLED_SYRINGE | Freq: Two times a day (BID) | INTRAMUSCULAR | 0 refills | Status: DC
Start: 2021-10-02 — End: 2021-10-10

## 2021-10-02 MED ORDER — WARFARIN SODIUM 1 MG PO TABS: ORAL_TABLET | ORAL | 11 refills | Status: DC

## 2021-10-02 NOTE — Progress Notes (Signed)
Recommendation from Dr [name] in [place] chat is to keep everything as is and pt will be seen in LVAD clinic on 10/07/2021.  ICM remote transmission rescheduled to 4/24.  Spoke with patient and provided recommendation from Dr [name].  Advised to call Advanced HF clinic if needed for next 2 days due to will be out of the office.  She verbalized understanding.   ?

## 2021-10-02 NOTE — Progress Notes (Signed)
Patient presented for labs and dressing change to [facility] with husband. Pt denies any issues with VAD equipment or drive line site. ? ?Audry Riles, PharmD in room to meet with patient and her husband. Husband reports patient is eating "all the time" since discharge home. Mr. Campoy says she is trying to get as much protein as possible to help with wound healing.  ? ?Drive Line: Existing VAD dressing removed and site care performed using sterile technique. Drive line exit site cleaned with Chlora prep applicators x 2, RINSED WITH SALINE, allowed to dry, and gauze dressing with Silver strip applied. Exit site healing and unincorporated, the velour is fully implanted at exit site. Small amount of fat necrosis drainage noted.  No redness, tenderness, foul odor or rash noted. Drive line anchor secure. Pt will continue daily dressing changes until next visit. Provided patient with 5 extra anchors for home use. ? ?Pt says she would be interested in home INR machine in the future.  ? ?Zada Girt RN, VAD Coordinator ?[phone] ?

## 2021-10-02 NOTE — Progress Notes (Signed)
LVAD INR 

## 2021-10-03 ENCOUNTER — Ambulatory Visit: Payer: Medicare Other | Admitting: Internal Medicine

## 2021-10-04 ENCOUNTER — Encounter (HOSPITAL_COMMUNITY): Payer: Self-pay | Admitting: Unknown Physician Specialty

## 2021-10-04 ENCOUNTER — Other Ambulatory Visit (HOSPITAL_COMMUNITY): Payer: Self-pay | Admitting: Unknown Physician Specialty

## 2021-10-04 DIAGNOSIS — Z95811 Presence of heart assist device: Secondary | ICD-10-CM

## 2021-10-04 DIAGNOSIS — Z7901 Long term (current) use of anticoagulants: Secondary | ICD-10-CM

## 2021-10-07 ENCOUNTER — Ambulatory Visit (HOSPITAL_COMMUNITY): Payer: Self-pay | Admitting: Pharmacist

## 2021-10-07 ENCOUNTER — Ambulatory Visit (HOSPITAL_COMMUNITY)
Admit: 2021-10-07 | Discharge: 2021-10-07 | Disposition: A | Discharge status: Home / Self Care | Payer: BC Managed Care – PPO | Attending: Cardiology | Admitting: Cardiology

## 2021-10-07 VITALS — BP 120/0 | HR 87 | Wt 225.4 lb

## 2021-10-07 DIAGNOSIS — E46 Unspecified protein-calorie malnutrition: Secondary | ICD-10-CM | POA: Insufficient documentation

## 2021-10-07 DIAGNOSIS — I130 Hypertensive heart and chronic kidney disease with heart failure and stage 1 through stage 4 chronic kidney disease, or unspecified chronic kidney disease: Secondary | ICD-10-CM | POA: Diagnosis not present

## 2021-10-07 DIAGNOSIS — I639 Cerebral infarction, unspecified: Secondary | ICD-10-CM | POA: Diagnosis not present

## 2021-10-07 DIAGNOSIS — Z95811 Presence of heart assist device: Secondary | ICD-10-CM | POA: Diagnosis not present

## 2021-10-07 DIAGNOSIS — Z7901 Long term (current) use of anticoagulants: Secondary | ICD-10-CM

## 2021-10-07 DIAGNOSIS — D649 Anemia, unspecified: Secondary | ICD-10-CM | POA: Insufficient documentation

## 2021-10-07 DIAGNOSIS — E039 Hypothyroidism, unspecified: Secondary | ICD-10-CM | POA: Diagnosis not present

## 2021-10-07 DIAGNOSIS — I4891 Unspecified atrial fibrillation: Secondary | ICD-10-CM | POA: Diagnosis not present

## 2021-10-07 DIAGNOSIS — I428 Other cardiomyopathies: Secondary | ICD-10-CM | POA: Insufficient documentation

## 2021-10-07 DIAGNOSIS — I340 Nonrheumatic mitral (valve) insufficiency: Secondary | ICD-10-CM | POA: Diagnosis not present

## 2021-10-07 DIAGNOSIS — I493 Ventricular premature depolarization: Secondary | ICD-10-CM | POA: Insufficient documentation

## 2021-10-07 DIAGNOSIS — Z8673 Personal history of transient ischemic attack (TIA), and cerebral infarction without residual deficits: Secondary | ICD-10-CM | POA: Insufficient documentation

## 2021-10-07 DIAGNOSIS — I5022 Chronic systolic (congestive) heart failure: Secondary | ICD-10-CM | POA: Diagnosis not present

## 2021-10-07 DIAGNOSIS — E0590 Thyrotoxicosis, unspecified without thyrotoxic crisis or storm: Secondary | ICD-10-CM | POA: Diagnosis not present

## 2021-10-07 DIAGNOSIS — Z7982 Long term (current) use of aspirin: Secondary | ICD-10-CM | POA: Diagnosis not present

## 2021-10-07 DIAGNOSIS — N183 Chronic kidney disease, stage 3 unspecified: Secondary | ICD-10-CM | POA: Diagnosis not present

## 2021-10-07 DIAGNOSIS — N83202 Unspecified ovarian cyst, left side: Secondary | ICD-10-CM | POA: Diagnosis not present

## 2021-10-07 LAB — CBC
HCT: 36.9 % (ref 36.0–46.0)
Hemoglobin: 11 g/dL — ABNORMAL LOW (ref 12.0–15.0)
MCH: 27 pg (ref 26.0–34.0)
MCHC: 29.8 g/dL — ABNORMAL LOW (ref 30.0–36.0)
MCV: 90.4 fL (ref 80.0–100.0)
Platelets: 253 10*3/uL (ref 150–400)
RBC: 4.08 MIL/uL (ref 3.87–5.11)
RDW: 18.3 % — ABNORMAL HIGH (ref 11.5–15.5)
WBC: 13.9 10*3/uL — ABNORMAL HIGH (ref 4.0–10.5)
nRBC: 0 % (ref 0.0–0.2)

## 2021-10-07 LAB — COMPREHENSIVE METABOLIC PANEL
ALT: 45 U/L — ABNORMAL HIGH (ref 0–44)
AST: 31 U/L (ref 15–41)
Albumin: 3.2 g/dL — ABNORMAL LOW (ref 3.5–5.0)
Alkaline Phosphatase: 106 U/L (ref 38–126)
Anion gap: 6 (ref 5–15)
BUN: 10 mg/dL (ref 6–20)
CO2: 31 mmol/L (ref 22–32)
Calcium: 9.3 mg/dL (ref 8.9–10.3)
Chloride: 102 mmol/L (ref 98–111)
Creatinine, Ser: 0.9 mg/dL (ref 0.44–1.00)
GFR, Estimated: >60 mL/min (ref >60)
Glucose, Bld: 79 mg/dL (ref 70–99)
Potassium: 3.9 mmol/L (ref 3.5–5.1)
Sodium: 139 mmol/L (ref 135–145)
Total Bilirubin: 0.6 mg/dL (ref 0.3–1.2)
Total Protein: 6.1 g/dL — ABNORMAL LOW (ref 6.5–8.1)

## 2021-10-07 LAB — PROTIME-INR
INR: 1.1 (ref 0.8–1.2)
Prothrombin Time: 14.4 seconds (ref 11.4–15.2)

## 2021-10-07 LAB — TSH: TSH: 0.235 u[IU]/mL — ABNORMAL LOW (ref 0.350–4.500)

## 2021-10-07 LAB — T4, FREE: Free T4: 1.84 ng/dL — ABNORMAL HIGH (ref 0.61–1.12)

## 2021-10-07 LAB — LACTATE DEHYDROGENASE: LDH: 272 U/L — ABNORMAL HIGH (ref 98–192)

## 2021-10-07 MED ORDER — AMIODARONE HCL 200 MG PO TABS
ORAL_TABLET | ORAL | 6 refills | Status: DC
Start: 2021-10-07 — End: 2021-10-14

## 2021-10-07 MED ORDER — WARFARIN SODIUM 1 MG PO TABS: ORAL_TABLET | ORAL | 11 refills | Status: DC

## 2021-10-07 MED ORDER — PREDNISONE 10 MG PO TABS: 10.0000 mg | ORAL_TABLET | Freq: Every day | ORAL | 0 refills | Status: DC

## 2021-10-07 MED ORDER — TORSEMIDE 20 MG PO TABS
40.0000 mg | ORAL_TABLET | Freq: Every day | ORAL | 6 refills | Status: DC
Start: 2021-10-07 — End: 2021-11-26

## 2021-10-07 MED ORDER — ZINC GLUCONATE 50 MG PO TABS: 50.0000 mg | ORAL_TABLET | Freq: Every day | ORAL | 6 refills | Status: DC

## 2021-10-07 MED ORDER — SPIRONOLACTONE 25 MG PO TABS: 12.5000 mg | ORAL_TABLET | Freq: Every day | ORAL | 3 refills | Status: DC

## 2021-10-07 NOTE — Progress Notes (Addendum)
Patient presents for hosp follow up in [facility] today. Reports no problems with VAD equipment or concerns with drive line. ? ?Pt states that she is feeling great. She is started to get out and walk some outside. ? ?Pt states that she is no longer taking any pain medications. She denies blood in her stools, SOB, PND, or orthopnea. Pt states she feels like "I have fluid in my feet:" ? ?BP up slightly today. EKG obtained - NSR. ? ?[name] - spouse is asking to extend his FMLA to 5/17. We will fill this papers out and fax today. ? ?Pt is requesting a home INR machine. I have faxed over referral to [facility]. ?  ?Vital Signs:  ?Doppler Pressure 120   ?Automatc BP: 130/113 (120) ?HR:87   ?SPO2:100% on RA ?  ?Weight: 225.4 lbs w/o eqt ?Last weight: 215 lb hosp d/c wt ?Home weights: 222 lbs ? ? ?VAD Indication: ?Destination Therapy d/t BMI ?  ?VAD interrogation & Equipment Management: ?Speed: 5800 ?Flow: 4.5 ?Power:4.6 w    ?PI:3.5 ?  ?Alarms: no clinical alarms ?Events: none ? ?Fixed speed 5800 ?Low speed limit: 5500 ?  ?Primary Controller:  Replace back up battery in 32 months. ?Back up controller:   Replace back up battery in 32 months. ?  ?Annual Equipment Maintenance on UBC/PM was performed on 09/03/21.  ?  ?I reviewed the LVAD parameters from today and compared the results to the patient's prior recorded data. LVAD interrogation was NEGATIVE for significant power changes, NEGATIVE for clinical alarms and STABLE for PI events/speed drops. No programming changes were made and pump is functioning within specified parameters. Pt is performing daily controller and system monitor self tests along with completing weekly and monthly maintenance for LVAD equipment. ?  ?LVAD equipment check completed and is in good working order. Back-up equipment present. Charged back up battery and performed self-test on equipment.  ?  ?Exit Site Care: ?Drive line is being maintained daily by her husband [name]. Existing VAD dressing removed and site  care performed using sterile technique. Drive line exit site cleaned with Chlora prep applicators x 2, Cellurate applied to exit site, allowed to dry, and gauze dressing with Silver strip applied. Skin irritation noted at the bottom of the dressing, this was covered by 2 x 2 so that the tape is not directly on the skin. Exit site healing and unincorporated, the velour is fully implanted at exit site. Scant amount of serous drainage. No redness, tenderness, foul odor or rash noted. Drive line anchor re-applied. Pt denies fever or chills. Pt was given 7 daily kits and 8 anchors for home use. May advance to twice a week dressing changes. ? ?  ? ? ?  ?Significant Events on VAD Support:  ? ? ?Device: Medtronic ?Therapies: on VF ?240 CT 200-240 ?VVI: 40 ?Last check: 09/18/21 ? ? ? BP & Labs:  ?MAP 120 - Doppler is reflecting {MAP ?  ?Hgb 11 - No S/S of bleeding. Specifically denies melena/BRBPR or nosebleeds. ?  ?LDH stable at 272 with established baseline of 250 - 450. Denies tea-colored urine. No power elevations noted on interrogation.  ? ?Plan:  ?Stop Aspirin  ?Stop Midodrine ?Start Spironolactone half a tab (12.5 mg) daily ?Decrease Prednisone 1 tablet daily ('10mg'$ ) ?Increase Torsemide to 2 tablets daily (40 mg) ?Decrease Amiodarone to 1 tablet daily (200 mg) ?Start Zinc tablets (may need to get these over the counter) ?We will refer you to Cardiac rehab at [facility] ?We will send  in for a home INR machine ?Please try to move your OB/GYN appt to a closer date ?May advance dressing changes to twice weekly (Monday/Thursday) ?  ?Tanda Rockers RN ?VAD Coordinator  ?  ?Office: [phone] ?24/7 Emergency VAD Pager: [phone] ? ? ?  ? ? ?  ?ID:  [name], DOB Aug 23, 1970, MRN [id number]    ?Provider location: [facility] ?Type of Visit: Established patient  ?  ?PCP:  [name], MD    ?Cardiologist:  [name], MD ? ?History of Present Illness: ?[name] is a 51 y.o. female who has a history of  morbid obesity, HTN, negative for sleep apnea, and systolic HF due to nonischemic cardiomyopathy s/p Medtronic ICD (2006). ? ?She was followed for systolic CHF initially in [place]. Apparently her EF recovered to 35-40%. However, she was admitted to [facility] 09/19/13 for worsening dyspnea and CP. Coronaries were reportedly OK on LHC at that time at [facility]. Echo showed EF of 15-20% with four-chamber enlargement with moderate-severe TR/MR. RHC as below. Rheumatological w/u negative.  CPX in 9/16 showed near-normal functional capacity.  Repeat echo in 6/17 shows that EF remains 20%. Repeat CPX in 1/18 was submaximal but probably only mild circulatory limitation.  Echo 8/18 showed EF 25-30%, severe LV dilation.  CPX in 6/19 showed low normal functional capacity.  Echo in 10/19 showed EF 20-25%, moderate LV dilation.  ?  ?Zio patch in 2/20 with 11% PVCs and NSVT, amiodarone started. Zio patch in 3/20 with 13.3% PVCs and NSVT.  She saw Dr. [name], he thought that increased PVCs were electromechanical in nature due to worsening heart failure.  Repeat Zio patch on amiodarone in 1/21 showed 6.2% PVCs.  ? ?She was admitted in 4/20 with VT in setting of hypokalemia.   ? ?CPX in 9/20 showed mild functional limitation, more due to body habitus than heart failure.  Echo in 4/21 showed EF 20% with severe LV dilation, diffuse hypokinesis, normal RV size and systolic function.  ? ?In 8/21, she had a shock for VF in the setting of hypokalemia.  ? ?Patient was admitted in 10/21 with CVA involving left MCA territory.  This was thought to be cardioembolic but LV thrombus was not visualized and no atrial fibrillation has been seen.  Echo in 10/21 showed EF 20-25%, severe LV dilation (no LV thrombus seen), mildly decreased RV systolic function, mild-moderate MR. She was taken by IR for thrombectomy and stent placement, now on ASA 81 and ticagrelor.  ? ?She had recurrent VT in 2/22, quinidine was added by EP.  Spironolactone and Coreg were  decreased due to low BP and lightheadedness.   ? ?Echo in 4/22 showed EF 25% with severe LV dilation and global hypokinesis, normal RV, mild-moderate MR.  ? ?CPX (8/22) showed peak VO2 14.3, VE/VCO2 slope 42, RER 1.09 => mild to moderate HF limitation, elevated VE/VCO2 slope is likely false from end exercise hyperventilation. Restrictive lung physiology from obesity.  ? ?Patient was admitted in 3/23 with recurrent CHF exacerbation with low cardiac output.  She was also noted to be hyperthyroidism but amiodarone had to be continued due to VT and poorly tolerated atrial fibrillation.  She required Impella 5.5 placement and eventual HM3 LVAD placement on 09/04/21.  Post-op course complicated by atrial fibrillation, malnutrition, and deconditioning.  She went to CIR prior to discharge.  ? ?She returns for followup of CHF and LVAD.  She is now at home and seems to be doing well.  She has been walking on flat ground and even up a small hill without dyspnea.  She has noted that her weight is going up and she has developed some peripheral edema.  No lightheadedness.  No palpitations. She remains on prednisone and methimazole for hyperthyroidism.  MAP is quite high today, she is still taking midodrine. No BRBPR/melena.  ? ?ECG (personally reviewed): NSR, IVCD ? ?LVAD interrogation: Please see LVAD nursing note above.  ? ?PMH: ?1. OSA: Using CPAP.  ?2. PVCs:  ?- Zio patch 2/20: 11% PVCs ?- Zio patch 3/20: 13.3% PVCs ?- Zio patch 1/21: 6.2% PVCs ?3. VT/VF: 8/21, in setting of hypokalemia.  2/22 VT, quinidine added.  ?4. 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  No LV thrombus visualized and atrial fibrillation has not been visualized.  ?- IR thrombectomy with stent placement in 10/21.  ?5. Chronic systolic CHF: Nonischemic cardiomyopathy:   ?- Coronary angiography 2015 at [facility]: Normal coronaries.  ?- Echo (8/15) with EF 20%, moderate LV dilation, diffuse hypokinesis, normal RV size with mildly decreased systolic function, mild MR.  ?-  Echo (5/16) with EF 20%, spherical LV with diffuse hypokinesis, mild MR.  ?- Echo (6/17) with EF 20%, diffuse hypokinesis, moderate LV dilation, normal RV, mild to moderate MR.  ?- RHC (12/17): mean RA 5,

## 2021-10-07 NOTE — Progress Notes (Signed)
LVAD INR 

## 2021-10-07 NOTE — Patient Instructions (Signed)
Stop Aspirin  ?Stop Midodrine ?Start Spironolactone half a tab (12.5 mg) daily ?Decrease Prednisone 1 tablet daily ('10mg'$ ) ?Increase Torsemide to 2 tablets daily (40 mg) ?Decrease Amiodarone to 1 tablet daily (200 mg) ?Start Zinc tablets (may need to get these over the counter) ?We will refer you to Cardiac rehab at [facility] ?We will send in for a home INR machine ?Please try to move your OB/GYN appt to a closer date ?May advance dressing changes to twice weekly (Monday/Thursday) ?

## 2021-10-08 ENCOUNTER — Ambulatory Visit (INDEPENDENT_AMBULATORY_CARE_PROVIDER_SITE_OTHER): Payer: BC Managed Care – PPO | Admitting: Internal Medicine

## 2021-10-08 ENCOUNTER — Telehealth (HOSPITAL_COMMUNITY): Payer: Self-pay | Admitting: Unknown Physician Specialty

## 2021-10-08 ENCOUNTER — Encounter: Payer: Self-pay | Admitting: Internal Medicine

## 2021-10-08 VITALS — BP 92/58 | HR 39 | Ht 63.0 in | Wt 224.0 lb

## 2021-10-08 DIAGNOSIS — E064 Drug-induced thyroiditis: Secondary | ICD-10-CM

## 2021-10-08 DIAGNOSIS — T462X5A Adverse effect of other antidysrhythmic drugs, initial encounter: Secondary | ICD-10-CM | POA: Diagnosis not present

## 2021-10-08 LAB — T3, FREE: T3, Free: 2.7 pg/mL (ref 2.0–4.4)

## 2021-10-08 MED ORDER — ASPIRIN EC 81 MG PO TBEC: 81.0000 mg | DELAYED_RELEASE_TABLET | Freq: Every day | ORAL | 3 refills | Status: DC

## 2021-10-08 MED ORDER — PREDNISONE 10 MG PO TABS: 15.0000 mg | ORAL_TABLET | Freq: Every day | ORAL | 1 refills | Status: DC

## 2021-10-08 MED ORDER — METHIMAZOLE 10 MG PO TABS: 10.0000 mg | ORAL_TABLET | Freq: Two times a day (BID) | ORAL | 1 refills | Status: DC

## 2021-10-08 NOTE — Telephone Encounter (Signed)
ASA was stopped at pts hosp f/u yesterday as she had completed 30 days of ASA per VAD implant protocol. Received message from DR [name] asking [name] to restart pts ASA d/t hx of CVA and CNS stent. Called pt and informed her to restart Aspirin 81 mg. Pt verbalized understanding of all instructions.  ? ?Tanda Rockers RN, BSN ?VAD Coordinator ?24/7 Pager [phone] ? ?

## 2021-10-08 NOTE — Patient Instructions (Signed)
-   Increase Methimazole 10 mg , 1 tablet twice daily  ?- Increase Prednisone 10 mg to 1.5 tablets daily with Breakfast  ?

## 2021-10-08 NOTE — Progress Notes (Signed)
? ? ?Name: [name]  ?MRN/ DOB: [id number], 1970/07/20    ?Age/ Sex: 51 y.o., female   ? ?PCP: Patient, No Pcp Per (Inactive)   ?Reason for Endocrinology Evaluation: Hyperthyroid  ?   ?Date of Initial Endocrinology Evaluation: 10/08/2021   ? ? ?HPI: ?[name] is a 51 y.o. female with a past medical history of CHF, nonischemic cardiomyopathy (S/P ICD placement), CVA, pulmonary HTN, paroxysmal A-fib. The patient presented for initial endocrinology clinic visit on 10/08/2021 for consultative assistance with her Hyperthyroidism.  ? ?Patient with extensive cardiac history to include A-fib, and nonischemic cardiomyopathy (s/p insertion of ICD) as well as left MCA territory CVA, s/p thrombectomy and stent placement. ? ? ?The patient presented to the ED on 08/25/2021 with palpitations and shortness of breath and was discharged on 09/20/2021 ? ? ?The patient has been on LT-for replacement since 2014 due to hypothyroid ? ?In review of her history the patient has been noted fluctuating TSH level of 10.272 u IU/mL but by 08/26/2021 her TSH was suppressed at < 0.010 uIU/mL Levothyroxine was stopped  and Methimazole started 08/26/2021 ? ?Amiodarone was initiated 07/2018 ? 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 ? ?She has lost weight but she attributes to lack of appetite  ?Palpitations have improved  ?Denies local neck swelling ?Had occasional  loose stools but this has resolved  ?Denies abdominal pain or vomiting  ? ? ?No FH of thyroid disease ?No Biotin intake  ?No prior thyroid /neck sx  ? ? ? ? ? ?HISTORY:  ?Past Medical History:  ?Past Medical History:  ?Diagnosis Date  ? Automatic implantable cardioverter-defibrillator in situ   ? Chronic CHF (congestive heart failure) (HCC)   ? a. EF 15-20% b. RHC (09/2013) RA 14, RV 57/22, PA 64/36 (48), PCWP 18, FIck CO/CI 3.7/1.6, PVR 8.1 WU, PA sat 47%   ? History of stomach ulcers   ? Hypertension   ? Hypotension   ? Hypothyroidism   ? Morbid obesity ([place])   ?  Myocardial infarction [facility]) 08/2013  ? Nocturnal dyspnea   ? Nonischemic cardiomyopathy ([place])   ? Sinus tachycardia   ? Sleep apnea   ? Snoring-prob OSA 09/04/2011  ? Sprint Fidelis ICD lead RECALL  [phone]   ? UARS (upper airway resistance syndrome) 09/04/2011  ? HST 12/2013:  AHI 4/hr (numerous episodes of airflow reduction that did not have concomitant desaturation)   ? ?Past Surgical History:  ?Past Surgical History:  ?Procedure Laterality Date  ? BREATH TEK H PYLORI N/A 11/09/2014  ? Procedure: BREATH TEK H PYLORI;  Surgeon: Greer Pickerel, MD;  Location: [facility];  Service: General;  Laterality: N/A;  ? CARDIAC CATHETERIZATION  ~ 2006; 09/2013  ? CARDIAC CATHETERIZATION N/A 05/23/2016  ? Procedure: Right Heart Cath;  Surgeon: Larey Dresser, MD;  Location: [place] CV LAB;  Service: Cardiovascular;  Laterality: N/A;  ? CARDIAC DEFIBRILLATOR PLACEMENT  2006; 12/26/2013  ? Medtronic [id number]; 12-2013 ICD gen change and RV lead revision with new 6935 RV lead by Dr [name]  ? CENTRAL LINE INSERTION  08/28/2021  ? Procedure: CENTRAL LINE INSERTION;  Surgeon: Larey Dresser, MD;  Location: [place] CV LAB;  Service: Cardiovascular;;  ? [place]  ? IMPLANTABLE CARDIOVERTER DEFIBRILLATOR GENERATOR CHANGE N/A 12/26/2013  ? Procedure: IMPLANTABLE CARDIOVERTER DEFIBRILLATOR GENERATOR CHANGE;  Surgeon: Deboraha Sprang, MD;  Location: [facility] CATH LAB;  Service: Cardiovascular;  Laterality: N/A;  ? INSERTION OF IMPLANTABLE  LEFT VENTRICULAR ASSIST DEVICE N/A 09/03/2021  ? Procedure: INSERTION OF IMPLANTABLE LEFT VENTRICULAR ASSIST DEVICE/ Heartmate 3;  Surgeon: Dahlia Byes, MD;  Location: [place];  Service: Open Heart Surgery;  Laterality: N/A;  ? IR CT HEAD LTD  04/17/2020  ? IR CT HEAD LTD  04/17/2020  ? IR INTRA CRAN STENT  04/17/2020  ? IR PERCUTANEOUS ART THROMBECTOMY/INFUSION INTRACRANIAL INC DIAG ANGIO  04/17/2020  ? IR RADIOLOGIST EVAL & MGMT  06/05/2020  ? LEAD REVISION N/A 12/26/2013  ? Procedure: LEAD  REVISION;  Surgeon: Deboraha Sprang, MD;  Location: [facility] CATH LAB;  Service: Cardiovascular;  Laterality: N/A;  ? PLACEMENT OF IMPELLA LEFT VENTRICULAR ASSIST DEVICE Right 08/29/2021  ? Procedure: PLACEMENT OF IMPELLA 5.5 LEFT VENTRICULAR ASSIST DEVICE;  Surgeon: Gaye Pollack, MD;  Location: [place];  Service: Open Heart Surgery;  Laterality: Right;  ? RADIOLOGY WITH ANESTHESIA N/A 04/17/2020  ? Procedure: Code Stroke;  Surgeon: Luanne Bras, MD;  Location: [place];  Service: Radiology;  Laterality: N/A;  ? REMOVAL OF IMPELLA LEFT VENTRICULAR ASSIST DEVICE N/A 09/03/2021  ? Procedure: REMOVAL OF IMPELLA LEFT VENTRICULAR ASSIST DEVICE;  Surgeon: Dahlia Byes, MD;  Location: [place];  Service: Open Heart Surgery;  Laterality: N/A;  ? RIGHT HEART CATH N/A 08/28/2021  ? Procedure: RIGHT HEART CATH;  Surgeon: Larey Dresser, MD;  Location: [place] CV LAB;  Service: Cardiovascular;  Laterality: N/A;  ? RIGHT HEART CATHETERIZATION N/A 02/15/2014  ? Procedure: RIGHT HEART CATH;  Surgeon: Larey Dresser, MD;  Location: [facility] CATH LAB;  Service: Cardiovascular;  Laterality: N/A;  ? TEE WITHOUT CARDIOVERSION N/A 08/29/2021  ? Procedure: TRANSESOPHAGEAL ECHOCARDIOGRAM (TEE);  Surgeon: Gaye Pollack, MD;  Location: [place];  Service: Open Heart Surgery;  Laterality: N/A;  ? TEE WITHOUT CARDIOVERSION N/A 09/03/2021  ? Procedure: TRANSESOPHAGEAL ECHOCARDIOGRAM (TEE);  Surgeon: Dahlia Byes, MD;  Location: [place];  Service: Open Heart Surgery;  Laterality: N/A;  ? TUBAL LIGATION  1999  ?  ?Social History:  reports that she has never smoked. She has never used smokeless tobacco. She reports that she does not drink alcohol and does not use drugs. ?Family History: family history includes Heart disease in her maternal grandmother and mother; High blood pressure in her father; Obesity in her mother; Stroke in her father. ? ? ?HOME MEDICATIONS: ?Allergies as of 10/08/2021   ?No Known Allergies ?  ? ?  ?Medication List  ?  ? ?  ? Accurate  as of October 08, 2021  7:53 AM. If you have any questions, ask your nurse or doctor.  ?  ?  ? ?  ? ?ALPRAZolam 0.25 MG tablet ?Commonly known as: Duanne Moron ?Take 1 tablet (0.25 mg total) by mouth 3 (three) times daily as needed for anxiety. ?  ?amiodarone 200 MG tablet ?Commonly known as: PACERONE ?Take 1 tablet (200 mg) by mouth daily ?  ?aspirin EC 81 MG tablet ?Take 1 tablet (81 mg total) by mouth daily. Swallow whole. ?Started by: Robyne Peers, RN ?  ?digoxin 0.125 MG tablet ?Commonly known as: LANOXIN ?Take 1/2 tablet (0.0625 mg total) by mouth daily. ?  ?enoxaparin 40 MG/0.4ML injection ?Commonly known as: Lovenox ?Inject 0.4 mLs (40 mg total) into the skin every 12 (twelve) hours. ?  ?gabapentin 100 MG capsule ?Commonly known as: NEURONTIN ?Take 1 capsule (100 mg total) by mouth 3 (three) times daily as needed (pain). ?  ?methimazole 10 MG tablet ?Commonly known as: TAPAZOLE ?Take  1 tablet (10 mg total) by mouth daily. ?  ?pantoprazole 40 MG tablet ?Commonly known as: PROTONIX ?Take 1 tablet (40 mg total) by mouth daily. ?  ?potassium chloride SA 20 MEQ tablet ?Commonly known as: KLOR-CON M ?Take 3 tablets (60 mEq total) by mouth 2 (two) times daily. ?  ?predniSONE 10 MG tablet ?Commonly known as: DELTASONE ?Take 1 tablet (10 mg total) by mouth daily with breakfast. ?  ?quiniDINE gluconate 324 MG CR tablet ?Take 1 tablet (324 mg total) by mouth 2 (two) times daily. ?  ?rosuvastatin 40 MG tablet ?Commonly known as: CRESTOR ?Take 1 tablet (40 mg total) by mouth at bedtime. ?  ?senna-docusate 8.6-50 MG tablet ?Commonly known as: Senokot-S ?Take 2 tablets by mouth 2 (two) times daily as needed for mild constipation. ?  ?sildenafil 20 MG tablet ?Commonly known as: REVATIO ?Take 1 tablet (20 mg total) by mouth 3 (three) times daily. ?  ?spironolactone 25 MG tablet ?Commonly known as: ALDACTONE ?Take 0.5 tablets (12.5 mg total) by mouth daily. ?  ?torsemide 20 MG tablet ?Commonly known as: DEMADEX ?Take 2 tablets  (40 mg total) by mouth daily. ?  ?traMADol 50 MG tablet ?Commonly known as: ULTRAM ?Take 1 tablet (50 mg total) by mouth every 4 (four) hours as needed for moderate pain. ?  ?warfarin 1 MG tablet ?Commonly

## 2021-10-10 ENCOUNTER — Encounter: Payer: Self-pay | Admitting: Gynecologic Oncology

## 2021-10-10 ENCOUNTER — Telehealth: Payer: Self-pay | Admitting: *Deleted

## 2021-10-10 NOTE — Telephone Encounter (Signed)
Spoke with the patient and scheduled a new patient appt with Dr Berline Lopes 5/4 at 11:15 am. Patient given the address and phone number for the clinic; along with the policy for mask and visitors  ?

## 2021-10-11 ENCOUNTER — Other Ambulatory Visit (HOSPITAL_COMMUNITY): Payer: Self-pay | Admitting: *Deleted

## 2021-10-11 ENCOUNTER — Encounter: Payer: Self-pay | Admitting: Gynecologic Oncology

## 2021-10-11 ENCOUNTER — Other Ambulatory Visit: Payer: Self-pay

## 2021-10-11 ENCOUNTER — Other Ambulatory Visit (HOSPITAL_COMMUNITY)
Admission: RE | Admit: 2021-10-11 | Discharge: 2021-10-11 | Disposition: A | Discharge status: Home / Self Care | Payer: BC Managed Care – PPO | Source: Ambulatory Visit | Attending: Gynecologic Oncology | Admitting: Gynecologic Oncology

## 2021-10-11 ENCOUNTER — Inpatient Hospital Stay: Payer: BC Managed Care – PPO | Attending: Gynecologic Oncology | Admitting: Gynecologic Oncology

## 2021-10-11 VITALS — BP 100/54 | HR 84 | Temp 98.1°F | Resp 16 | Ht 63.0 in | Wt 226.1 lb

## 2021-10-11 DIAGNOSIS — R1900 Intra-abdominal and pelvic swelling, mass and lump, unspecified site: Secondary | ICD-10-CM | POA: Insufficient documentation

## 2021-10-11 DIAGNOSIS — N9489 Other specified conditions associated with female genital organs and menstrual cycle: Secondary | ICD-10-CM | POA: Insufficient documentation

## 2021-10-11 DIAGNOSIS — I6602 Occlusion and stenosis of left middle cerebral artery: Secondary | ICD-10-CM

## 2021-10-11 DIAGNOSIS — N189 Chronic kidney disease, unspecified: Secondary | ICD-10-CM | POA: Insufficient documentation

## 2021-10-11 DIAGNOSIS — I252 Old myocardial infarction: Secondary | ICD-10-CM | POA: Insufficient documentation

## 2021-10-11 DIAGNOSIS — I4891 Unspecified atrial fibrillation: Secondary | ICD-10-CM | POA: Insufficient documentation

## 2021-10-11 DIAGNOSIS — Z01419 Encounter for gynecological examination (general) (routine) without abnormal findings: Secondary | ICD-10-CM | POA: Diagnosis not present

## 2021-10-11 DIAGNOSIS — I480 Paroxysmal atrial fibrillation: Secondary | ICD-10-CM | POA: Diagnosis not present

## 2021-10-11 DIAGNOSIS — Z7901 Long term (current) use of anticoagulants: Secondary | ICD-10-CM

## 2021-10-11 DIAGNOSIS — I509 Heart failure, unspecified: Secondary | ICD-10-CM | POA: Diagnosis not present

## 2021-10-11 DIAGNOSIS — I5022 Chronic systolic (congestive) heart failure: Secondary | ICD-10-CM

## 2021-10-11 DIAGNOSIS — E0590 Thyrotoxicosis, unspecified without thyrotoxic crisis or storm: Secondary | ICD-10-CM | POA: Insufficient documentation

## 2021-10-11 DIAGNOSIS — Z6841 Body Mass Index (BMI) 40.0 and over, adult: Secondary | ICD-10-CM

## 2021-10-11 DIAGNOSIS — Z1151 Encounter for screening for human papillomavirus (HPV): Secondary | ICD-10-CM | POA: Insufficient documentation

## 2021-10-11 DIAGNOSIS — Z8673 Personal history of transient ischemic attack (TIA), and cerebral infarction without residual deficits: Secondary | ICD-10-CM

## 2021-10-11 DIAGNOSIS — Z79899 Other long term (current) drug therapy: Secondary | ICD-10-CM | POA: Insufficient documentation

## 2021-10-11 DIAGNOSIS — Z124 Encounter for screening for malignant neoplasm of cervix: Secondary | ICD-10-CM | POA: Insufficient documentation

## 2021-10-11 DIAGNOSIS — E039 Hypothyroidism, unspecified: Secondary | ICD-10-CM | POA: Insufficient documentation

## 2021-10-11 DIAGNOSIS — Z7982 Long term (current) use of aspirin: Secondary | ICD-10-CM | POA: Insufficient documentation

## 2021-10-11 DIAGNOSIS — Z95811 Presence of heart assist device: Secondary | ICD-10-CM

## 2021-10-11 DIAGNOSIS — I428 Other cardiomyopathies: Secondary | ICD-10-CM | POA: Insufficient documentation

## 2021-10-11 NOTE — Patient Instructions (Signed)
It was very nice to meet you today. ? ?As we discussed and reviewed on your CT scan and ultrasound, there is a cystic mass in your pelvis, likely coming from one of your ovaries or fallopian tubes.  Although there are multiple components to the mass, there are not findings on ultrasound that are especially concerning from a cancer standpoint.  You also had some blood work done during your recent hospital stay (tumor markers) that was normal.  While this does not mean that the mass is benign, it provides further reassurance in the setting of your imaging. ? ?The 3 management options that we discussed today were surgery (removal of the mass), draining it (outpatient procedure where we placed a needle into the cyst to drain some of the fluid to tested for cancer), and following closely with imaging. ? ?The next step is to have a discussion with your cardiologist about your risk for major surgery.  If you are deemed at acceptable risk for surgery by your cardiologist, then the question will be what is the interval that we would need to wait after your recent surgery before we could plan for surgery. ? ?In the meantime, we will get you scheduled for a pelvic ultrasound 3 months after your ultrasound in March.  This will give [name] a chance to evaluate whether there has been any change in size of the pelvic mass or the way it looks on ultrasound. ? ?We will have a phone visit shortly after your ultrasound to discuss next steps. ?

## 2021-10-11 NOTE — Progress Notes (Signed)
GYNECOLOGIC ONCOLOGY NEW PATIENT CONSULTATION  ? ?Patient Name: [name]  ?Patient Age: 51 y.o. ?Date of Service: 10/11/2021 ?Referring Provider: Dr. [name] ? ?Primary Care Provider: Patient, No Pcp Per (Inactive) ?Consulting Provider: Jeral Pinch, MD  ? ?Assessment/Plan:  ?Premenopausal patient with complex adnexal mass. ? ?I reviewed findings from recent CT scan and ultrasound with the patient and her partner.  We looked at the images together.  There is a multiseptated cystic mass within the pelvis.  It appears adnexal, although whether its origin is the fallopian tube or ovary is more challenging to delineate.  It is rather oblong and stretches along the length of the posterior aspect of the uterus.  Although there are septations within the mass, I do not see mural nodularity, excrescences, or other features that increase the concern for malignancy. ? ?We also discussed her lab testing while inpatient.  She had tumor markers that were drawn and normal.  We discussed the limitations of these tests and that they are not able to diagnose or definitively rule out malignancy.  I discussed specifically the limitations of CA-125.  This test may be normal in up to 50% of patients with early stage ovarian cancer and can be elevated in many noncancerous disease processes. ? ?With regard to management, in the setting of someone who was at the lower acceptable surgical risk, given the size of this mass, my recommendation would be to proceed with diagnostic surgery.  The patient, likely, is not symptomatic.  Given her complex comorbidities, especially her cardiac history, I do not think that she is at low risk from a surgical standpoint.  She sees her cardiologist on Monday.  I encouraged her to have a conversation with her cardiologist about her risk related to major surgery.  If she were deemed to be at an acceptable risk for surgery, I would appreciate some guidance in terms of how long to delay surgery  after her recent LVAD procedure. ? ?In terms of other management options, we discussed percutaneous biopsy.  While this is generally avoided in the setting of concern for malignancy, it can be quite helpful in terms of decompressing a cystic mass and sending fluid for cytology to obtain a diagnosis.  If the patient is very high risk for surgery, then even in the setting of cancer, we would not be planning for surgery.  In this case, it may make sense to proceed with percutaneous cyst drainage for diagnosis. ? ?We also discussed the option of close surveillance with imaging.  Unfortunately, the patient is not a candidate for MRI.  We could repeat a pelvic ultrasound in 3 months to assess for any change in size or character of the mass.  This may help [name] determine the need and urgency for either surgery or percutaneous drainage moving forward. ? ?The patient, understandably, would like to avoid surgery if at all possible.  We have scheduled her for a pelvic ultrasound in June, 3 months after her last.  We will await news from cardiology about her risk related to any surgical procedure in the pelvis.  I will have a phone visit with her after her ultrasound to discuss next steps in management. ? ?A copy of this note was sent to the patient's referring provider.  ? ?65 minutes of total time was spent for this patient encounter, including preparation, face-to-face counseling with the patient and coordination of care, and documentation of the encounter. ? ?Jeral Pinch, MD  ?Division of Gynecologic Oncology  ?  Department of Obstetrics and Gynecology  ?[facility]  ?___________________________________________  ?Chief Complaint: ?No chief complaint on file. ? ? ?History of Present Illness:  ?[name] is a 51 y.o. y.o. female who is seen in consultation at the request of Dr. Aundra Clark for an evaluation of a complex adnexal mass. ? ?The patient was recently admitted from 3/6 to 3/31 with low  output heart failure in the setting of hyperthyroidism.  Ultimately underwent placement of an LVAD on 3/14.  She was discharged to acute inpatient rehab at the end of March and discharged from rehab on 4/6. During her hospital stay, CT scan was performed with incidental finding of a pelvic mass.  Further work-up and treatment was deferred in the setting of her acute cardiac issues. ? ?CT of the abdomen and pelvis on 08/26/2021 with incidental finding of a 11.7 x 4.3 x 7.4 cm low-attenuation mass within the left adnexa.  Uterus and right ovary unremarkable in appearance.  No significant volume of ascites, no adenopathy.  The findings included cardiomegaly, mild interstitial pulmonary edema in the lung bases, biliary sludge, and a 5 mm nonobstructive calculus in the lower pole of the collecting system of the right kidney. ?Pelvic ultrasound exam on 08/27/2021 shows a 12 x 9.4 x 4.8 cm complex multiseptated cystic mass replacing the left adnexa.  Right ovary normal in appearance measuring up to 2.9 cm. ?Tumor markers obtained on 08/26/2021 were notable for a normal CEA of 1.1 and CA-125 of 17.3. ? ?Her medical history is notable for significant cardiac history including A-fib, CHF, prior stroke (2021), obesity, chronic kidney disease, and hypothyroidism. ? ?Since her hospital discharge, the patient reports doing well at home.  She denies any abdominal or pelvic pain.  Her appetite is slowly improving.  She denies any nausea or emesis.  Her diarrhea is improved and she endorses normal bowel function.  She denies any bladder symptoms.  She is walking by herself at home.  She uses a walker with a seat when she is out of the house. ? ?From a gynecologic standpoint, the patient denies any vaginal bleeding or discharge.  She endorses regular menses, last period in March.  In the setting of her acute illness and surgery, she did not have a menses in early April.  She notes menses are regular, denies any recent changes to bleeding  in terms of length of menstrual period or quantity.  She denies any intermenstrual bleeding. ? ?PAST MEDICAL HISTORY:  ?Past Medical History:  ?Diagnosis Date  ? Automatic implantable cardioverter-defibrillator in situ   ? Chronic CHF (congestive heart failure) (HCC)   ? a. EF 15-20% b. RHC (09/2013) RA 14, RV 57/22, PA 64/36 (48), PCWP 18, FIck CO/CI 3.7/1.6, PVR 8.1 WU, PA sat 47%   ? History of hypothyroidism   ? History of stomach ulcers   ? Hyperthyroidism   ? Hypotension   ? Morbid obesity ([place])   ? Myocardial infarction [facility]) 08/2013  ? Nocturnal dyspnea   ? Nonischemic cardiomyopathy ([place])   ? Sinus tachycardia   ? Sleep apnea   ? Snoring-prob OSA 09/04/2011  ? Sprint Fidelis ICD lead RECALL  [phone]   ? Stroke [facility]) 2021  ? some residual right-sided weakness  ? UARS (upper airway resistance syndrome) 09/04/2011  ? HST 12/2013:  AHI 4/hr (numerous episodes of airflow reduction that did not have concomitant desaturation)   ?  ? ?PAST SURGICAL HISTORY:  ?Past Surgical History:  ?Procedure Laterality Date  ?  BREATH TEK H PYLORI N/A 11/09/2014  ? Procedure: BREATH TEK H PYLORI;  Surgeon: Greer Pickerel, MD;  Location: [facility];  Service: General;  Laterality: N/A;  ? CARDIAC CATHETERIZATION  ~ 2006; 09/2013  ? CARDIAC CATHETERIZATION N/A 05/23/2016  ? Procedure: Right Heart Cath;  Surgeon: Larey Dresser, MD;  Location: [facility];  Service: Cardiovascular;  Laterality: N/A;  ? CARDIAC DEFIBRILLATOR PLACEMENT  2006; 12/26/2013  ? Medtronic [id number]; 12-2013 ICD gen change and RV lead revision with new 6935 RV lead by Dr [name]  ? CENTRAL LINE INSERTION  08/28/2021  ? Procedure: CENTRAL LINE INSERTION;  Surgeon: Larey Dresser, MD;  Location: [facility];  Service: Cardiovascular;;  ? [place]  ? IMPLANTABLE CARDIOVERTER DEFIBRILLATOR GENERATOR CHANGE N/A 12/26/2013  ? Procedure: IMPLANTABLE CARDIOVERTER DEFIBRILLATOR GENERATOR CHANGE;  Surgeon: Deboraha Sprang, MD;  Location: [facility] C[facility] LAB;   Service: Cardiovascular;  Laterality: N/A;  ? INSERTION OF IMPLANTABLE LEFT VENTRICULAR ASSIST DEVICE N/A 09/03/2021  ? Procedure: INSERTION OF IMPLANTABLE LEFT VENTRICULAR ASSIST DEVICE/ Heartmate 3;  Surge

## 2021-10-11 NOTE — Addendum Note (Signed)
Addended by: Joylene John D on: 10/11/2021 11:15 AM ? ? Modules accepted: Orders ? ?

## 2021-10-14 ENCOUNTER — Ambulatory Visit (HOSPITAL_COMMUNITY)
Admission: RE | Admit: 2021-10-14 | Discharge: 2021-10-14 | Disposition: A | Discharge status: Home / Self Care | Payer: BC Managed Care – PPO | Source: Ambulatory Visit | Attending: Cardiology | Admitting: Cardiology

## 2021-10-14 ENCOUNTER — Ambulatory Visit (HOSPITAL_COMMUNITY): Payer: Self-pay | Admitting: Pharmacist

## 2021-10-14 ENCOUNTER — Ambulatory Visit (INDEPENDENT_AMBULATORY_CARE_PROVIDER_SITE_OTHER): Payer: BC Managed Care – PPO

## 2021-10-14 VITALS — BP 125/90 | HR 88 | Ht 63.0 in | Wt 226.2 lb

## 2021-10-14 DIAGNOSIS — Z4801 Encounter for change or removal of surgical wound dressing: Secondary | ICD-10-CM | POA: Insufficient documentation

## 2021-10-14 DIAGNOSIS — N83202 Unspecified ovarian cyst, left side: Secondary | ICD-10-CM | POA: Insufficient documentation

## 2021-10-14 DIAGNOSIS — E0590 Thyrotoxicosis, unspecified without thyrotoxic crisis or storm: Secondary | ICD-10-CM | POA: Diagnosis not present

## 2021-10-14 DIAGNOSIS — I5022 Chronic systolic (congestive) heart failure: Secondary | ICD-10-CM | POA: Diagnosis not present

## 2021-10-14 DIAGNOSIS — Z79899 Other long term (current) drug therapy: Secondary | ICD-10-CM | POA: Diagnosis not present

## 2021-10-14 DIAGNOSIS — Z6841 Body Mass Index (BMI) 40.0 and over, adult: Secondary | ICD-10-CM | POA: Insufficient documentation

## 2021-10-14 DIAGNOSIS — Z8673 Personal history of transient ischemic attack (TIA), and cerebral infarction without residual deficits: Secondary | ICD-10-CM | POA: Insufficient documentation

## 2021-10-14 DIAGNOSIS — Z95810 Presence of automatic (implantable) cardiac defibrillator: Secondary | ICD-10-CM | POA: Diagnosis not present

## 2021-10-14 DIAGNOSIS — E46 Unspecified protein-calorie malnutrition: Secondary | ICD-10-CM | POA: Diagnosis not present

## 2021-10-14 DIAGNOSIS — I493 Ventricular premature depolarization: Secondary | ICD-10-CM | POA: Insufficient documentation

## 2021-10-14 DIAGNOSIS — Z7901 Long term (current) use of anticoagulants: Secondary | ICD-10-CM | POA: Diagnosis not present

## 2021-10-14 DIAGNOSIS — I081 Rheumatic disorders of both mitral and tricuspid valves: Secondary | ICD-10-CM | POA: Insufficient documentation

## 2021-10-14 DIAGNOSIS — Z7982 Long term (current) use of aspirin: Secondary | ICD-10-CM | POA: Insufficient documentation

## 2021-10-14 DIAGNOSIS — I4891 Unspecified atrial fibrillation: Secondary | ICD-10-CM | POA: Insufficient documentation

## 2021-10-14 DIAGNOSIS — I428 Other cardiomyopathies: Secondary | ICD-10-CM | POA: Insufficient documentation

## 2021-10-14 DIAGNOSIS — Z95811 Presence of heart assist device: Secondary | ICD-10-CM | POA: Diagnosis not present

## 2021-10-14 DIAGNOSIS — N183 Chronic kidney disease, stage 3 unspecified: Secondary | ICD-10-CM | POA: Insufficient documentation

## 2021-10-14 DIAGNOSIS — I130 Hypertensive heart and chronic kidney disease with heart failure and stage 1 through stage 4 chronic kidney disease, or unspecified chronic kidney disease: Secondary | ICD-10-CM | POA: Diagnosis not present

## 2021-10-14 DIAGNOSIS — Z7952 Long term (current) use of systemic steroids: Secondary | ICD-10-CM | POA: Insufficient documentation

## 2021-10-14 DIAGNOSIS — D631 Anemia in chronic kidney disease: Secondary | ICD-10-CM | POA: Insufficient documentation

## 2021-10-14 DIAGNOSIS — I2721 Secondary pulmonary arterial hypertension: Secondary | ICD-10-CM | POA: Insufficient documentation

## 2021-10-14 LAB — CBC
HCT: 37.7 % (ref 36.0–46.0)
Hemoglobin: 11.4 g/dL — ABNORMAL LOW (ref 12.0–15.0)
MCH: 27.1 pg (ref 26.0–34.0)
MCHC: 30.2 g/dL (ref 30.0–36.0)
MCV: 89.5 fL (ref 80.0–100.0)
Platelets: 234 10*3/uL (ref 150–400)
RBC: 4.21 MIL/uL (ref 3.87–5.11)
RDW: 17.6 % — ABNORMAL HIGH (ref 11.5–15.5)
WBC: 12.2 10*3/uL — ABNORMAL HIGH (ref 4.0–10.5)
nRBC: 0 % (ref 0.0–0.2)

## 2021-10-14 LAB — PROTIME-INR
INR: 1.1 (ref 0.8–1.2)
Prothrombin Time: 13.9 seconds (ref 11.4–15.2)

## 2021-10-14 LAB — BASIC METABOLIC PANEL
Anion gap: 9 (ref 5–15)
BUN: 10 mg/dL (ref 6–20)
CO2: 30 mmol/L (ref 22–32)
Calcium: 9.5 mg/dL (ref 8.9–10.3)
Chloride: 100 mmol/L (ref 98–111)
Creatinine, Ser: 0.98 mg/dL (ref 0.44–1.00)
GFR, Estimated: >60 mL/min (ref >60)
Glucose, Bld: 84 mg/dL (ref 70–99)
Potassium: 3.8 mmol/L (ref 3.5–5.1)
Sodium: 139 mmol/L (ref 135–145)

## 2021-10-14 LAB — LACTATE DEHYDROGENASE: LDH: 244 U/L — ABNORMAL HIGH (ref 98–192)

## 2021-10-14 MED ORDER — AMIODARONE HCL 200 MG PO TABS: 100.0000 mg | ORAL_TABLET | Freq: Every day | ORAL | 6 refills | Status: DC

## 2021-10-14 MED ORDER — ENTRESTO 24-26 MG PO TABS: 1.0000 | ORAL_TABLET | Freq: Two times a day (BID) | ORAL | 6 refills | Status: DC

## 2021-10-14 MED ORDER — WARFARIN SODIUM 1 MG PO TABS: ORAL_TABLET | ORAL | 11 refills | Status: DC

## 2021-10-14 MED ORDER — ENOXAPARIN SODIUM 40 MG/0.4ML IJ SOSY: 40.0000 mg | PREFILLED_SYRINGE | Freq: Two times a day (BID) | INTRAMUSCULAR | 0 refills | Status: DC

## 2021-10-14 NOTE — Progress Notes (Addendum)
Patient presents for 1 week f/u up in [facility] today. Reports no problems with VAD equipment or concerns with drive line. ? ?Pt states that she is feeling great. She is started to get out and walk some outside. She is increasing activity. ? ?Pt confirmed that she stopped midodrine, started [name], increased Torsemide and decreased Amio. ? ?Pt has seen Endo and OB/GYN since we saw her last week. Endo increased the prednisone. 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 Dr [name] informed the pt that we would need to wait 6 months prior to her having surgery this date would be 03/06/22. ? ?Pt has not heard back from the [facility] regarding home INRs. I will check in with them today. ? ?Pts BP better today but still up slightly, will start Entresto 24-26 per Dr [name]. Pt informed to call if she becomes dizzy or feels like she might pass out or has VAD alarms. If so, we may need to back off on her Torsemide. ?  ?Vital Signs:  ?Doppler Pressure: UTO  ?Automatc BP: 125/90 (99) ?HR:88   ?SPO2: 99% on RA ?  ?Weight: 226.2 lbs w/o eqt ?Last weight: 225.4 lb w/o eqt ?Home weights: 222 lbs ? ? ?VAD Indication: ?Destination Therapy d/t BMI ?  ?VAD interrogation & Equipment Management: ?Speed: 5800 ?Flow: 4.7 ?Power:4.6 w    ?PI: 2.9 ?  ?Alarms: no clinical alarms ?Events: none ? ?Fixed speed 5800 ?Low speed limit: 5500 ?  ?Primary Controller:  Replace back up battery in 30 months. ?Back up controller:   Replace back up battery in 30 months. ?  ?Annual Equipment Maintenance on UBC/PM was performed on 09/03/21.  ?  ?I reviewed the LVAD parameters from today and compared the results to the patient's prior recorded data. LVAD interrogation was NEGATIVE for significant power changes, NEGATIVE for clinical alarms and STABLE for PI events/speed drops. No programming changes were made and pump is functioning within specified parameters. Pt is performing daily controller and system monitor self tests along with completing weekly  and monthly maintenance for LVAD equipment. ?  ?LVAD equipment check completed and is in good working order. Back-up equipment present. Charged back up battery and performed self-test on equipment.  ?  ?Exit Site Care: ?Drive line is being maintained daily by her husband [name]. Existing VAD dressing removed and site care performed using sterile technique. Drive line exit site cleaned with Chlora prep applicators x 2, Cellurate applied to exit site, allowed to dry, and gauze dressing with Silver strip applied. Skin irritation noted at the bottom of the dressing - much improved today.  Exit site healing and unincorporated, the velour is fully implanted at exit site. Moderate amount of serous drainage. No redness, tenderness, foul odor or rash noted. Drive line anchor re-applied. Pt denies fever or chills. Pt was given a bottle of silver strips for home use. Continue twice a week dressing changes. ? ? ? ? ?  ? ?Significant Events on VAD Support:  ? ? ?Device: Medtronic ?Therapies: on VF ?240 CT 200-240 ?VVI: 40 ?Last check: 09/18/21 ? ? ? BP & Labs:  ?MAP 99 - UTO doppler ?  ?Hgb 11.4 - No S/S of bleeding. Specifically denies melena/BRBPR or nosebleeds. ?  ?LDH stable at 272 with established baseline of 250 - 450. Denies tea-colored urine. No power elevations noted on interrogation.  ? ?Plan:  ?Decrease Amiodarone to half a tab daily (100 mg) ?Start Entresto 24-26 twice a day ? Return to clinic  in 2 weeks w/TSH, T4 and T3 ? ?Tanda Rockers RN ?VAD Coordinator  ?  ?Office: [phone] ?24/7 Emergency VAD Pager: [phone] ?  ? ? ?  ?ID:  [name], DOB 11/10/1970, MRN [id number]    ?Provider location: [facility] ?Type of Visit: Established patient  ?  ?PCP:  [name], MD    ?Cardiologist:  [name], MD ? ?History of Present Illness: ?[name] is a 51 y.o. female who has a history of morbid obesity, HTN, negative for sleep apnea, and systolic HF due to nonischemic cardiomyopathy s/p  Medtronic ICD (2006). ? ?She was followed for systolic CHF initially in [place]. Apparently her EF recovered to 35-40%. However, she was admitted to [facility] 09/19/13 for worsening dyspnea and CP. Coronaries were reportedly OK on LHC at that time at [facility]. Echo showed EF of 15-20% with four-chamber enlargement with moderate-severe TR/MR. RHC as below. Rheumatological w/u negative.  CPX in 9/16 showed near-normal functional capacity.  Repeat echo in 6/17 shows that EF remains 20%. Repeat CPX in 1/18 was submaximal but probably only mild circulatory limitation.  Echo 8/18 showed EF 25-30%, severe LV dilation.  CPX in 6/19 showed low normal functional capacity.  Echo in 10/19 showed EF 20-25%, moderate LV dilation.  ?  ?Zio patch in 2/20 with 11% PVCs and NSVT, amiodarone started. Zio patch in 3/20 with 13.3% PVCs and NSVT.  She saw Dr. [name], he thought that increased PVCs were electromechanical in nature due to worsening heart failure.  Repeat Zio patch on amiodarone in 1/21 showed 6.2% PVCs.  ? ?She was admitted in 4/20 with VT in setting of hypokalemia.   ? ?CPX in 9/20 showed mild functional limitation, more due to body habitus than heart failure.  Echo in 4/21 showed EF 20% with severe LV dilation, diffuse hypokinesis, normal RV size and systolic function.  ? ?In 8/21, she had a shock for VF in the setting of hypokalemia.  ? ?Patient was admitted in 10/21 with CVA involving left MCA territory.  This was thought to be cardioembolic but LV thrombus was not visualized and no atrial fibrillation has been seen.  Echo in 10/21 showed EF 20-25%, severe LV dilation (no LV thrombus seen), mildly decreased RV systolic function, mild-moderate MR. She was taken by IR for thrombectomy and stent placement, now on ASA 81 and ticagrelor.  ? ?She had recurrent VT in 2/22, quinidine was added by EP.  Spironolactone and Coreg were decreased due to low BP and lightheadedness.   ? ?Echo in 4/22 showed EF 25% with severe LV dilation and  global hypokinesis, normal RV, mild-moderate MR.  ? ?CPX (8/22) showed peak VO2 14.3, VE/VCO2 slope 42, RER 1.09 => mild to moderate HF limitation, elevated VE/VCO2 slope is likely false from end exercise hyperventilation. Restrictive lung physiology from obesity.  ? ?Patient was admitted in 3/23 with recurrent CHF exacerbation with low cardiac output.  She was also noted to be hyperthyroidism but amiodarone had to be continued due to VT and poorly tolerated atrial fibrillation.  She required Impella 5.5 placement and eventual HM3 LVAD placement on 09/04/21.  Post-op course complicated by atrial fibrillation, malnutrition, and deconditioning.  She went to CIR prior to discharge.  ? ?She returns for followup of CHF and LVAD.  Weight down 1 lb.  She feels very good, walking up stairs without problems. No exertional dyspnea walking on flat ground.  No orthopnea/PND.  She was seen by endocrinology, methimazole and prednisone  were both increased.  She also saw OB/gyn and ideally will need surgery to remove ovarian mass.  ? ?LVAD interrogation: Please see LVAD nursing note above.  ? ?Labs: ?(11/09/13): Dig level 0.5, K 3.6, Creatinine 0.83, pro-BNP 622 ?(12/19/13): K 3.6, creatinine 0.8 ?(7/15): K 4.1, creatinine 0.91 ?(8/15): K 3.9, creatinine 0.91 ?(10/15): K 4, creatinine 0.93, proBNP 129 ?(12/15): K 4, creatinine 0.9, digoxin 0.4 ?(5/16): K 4, creatinine 0.86 ?(9/16): K 3.6, creatinine 0.86, digoxin < 0.2 ?(5/17): K 4.1, creatinine 0.95, digoxin 0.3 ?(5/18): K 3.4, creatinine 0.95, TSH elevated but free T3 and free T4 normal.  ?(8/18): K 3.9, creatinine 0.87, BNP 108  ?(11/18): K 3.5, creatinine 1.04, digoxin 0.4 ?(1/19): LDL 91, HDL 47 ?(3/19): digoxin 0.7, K 4, creatinine 0.99 ?(4/19): TSH normal, LDL 95 ?(9/19): LDL 99, HDL 45, K 4, creatinine 0.96 ?(10/19): K 3.6, creatinine 0.85, digoxin 0.5 ?(2/20): LDL 103 ?(3/20): K 3.9, creatinine 1.05 ?(4/20): K 3.9, creatinine 1.23 => 1.15, TSH 8, digoxin 0.3, LFTs  normal ?6/20): K 4.3, creatinine 1.07, LFTs normal, digoxin 0.5 ?(7/20): K 3.8, creatinine 1.16 ?(8/20): digoxin < 0.2, LDL 135 ?(1/21): K 3.9, creatinine 1.05, LDL 171  ?(4/21): K 4.2, creatinine 0.95, LDL 127, T

## 2021-10-14 NOTE — Progress Notes (Signed)
LVAD INR 

## 2021-10-14 NOTE — Patient Instructions (Signed)
Decrease Amiodarone to half a tab daily (100 mg) ?Start Entresto 24-26 twice a day ? Return to clinic in 2 weeks ?

## 2021-10-15 LAB — CYTOLOGY - PAP
Adequacy: ABSENT
Comment: NEGATIVE
Diagnosis: NEGATIVE
High risk HPV: NEGATIVE

## 2021-10-15 NOTE — Progress Notes (Signed)
EPIC Encounter for ICM Monitoring ? ?Patient Name: [name] is a 51 y.o. female ?Date: 10/15/2021 ?Primary Care Physican: Patient, No Pcp Per (Inactive) ?Primary Cardiologist: [name] ?Electrophysiologist: [name] ?07/30/2021 Weight: 225-227 lbs (baseline since being diuresed) ?08/20/2021 Weight: 228 lbs ?09/30/2021 Weight: 215 lbs ?10/15/2021 Weight: 215 lbs ?  ?  ?Spoke with patient and heart failure questions reviewed.  Pt asymptomatic for fluid accumulation. She is feeling well at this time. Per LVAD 4/24 OV note, not overloaded by exam despite thoracic impedance continues to suggest possible fluid overload.  ? ?Optivol Thoracic impedance suggesting possible fluid accumulation starting 3/12.  Fluid index > normal threshold starting 09/08/2021.    ?  ?Prescribed:  ?Torsemide 20 mg Take 2 tablets (40 mg total) by mouth daily. ?Potassium 20 mEq Take 3 tablets (60 mEq total) by mouth twice a day  ?  ?Labs: ?10/18/2021 BMET Scheduled ?10/14/2021 Creatinine 0.98, BUN 10, Potassium 3.8, Sodium 139, GFR <60 ?10/07/2021 Creatinine 0.90, BUN 10, Potassium 3.9, Sodium 139, GFR >60 ?10/02/2021 Creatinine 0.87, BUN 16, Potassium 4.4, Sodium 141, GFR >60 ?09/26/2021 Creatinine 0.87, BUN 14, Potassium 4.4, Sodium 140, GFR >60  ?09/25/2021 Creatinine 0.88, BUN 17, Potassium 4.1, Sodium 139, GFR >60  ?09/24/2021 Potassium 4.2 at 3:25 PM  ?09/24/2021 Creatinine 0.83, BUN 19, Potassium 2.9, Sodium 143, GFR >60 at 4:40 AM  ?09/23/2021 Creatinine 0.84, BUN 28, Potassium 3.7, Sodium 139, GFR >60  ?09/22/2021 Creatinine 0.92, BUN 28, Potassium 3.0 (3.5 at 8:11 PM), Sodium 145, GFR >60  ?09/21/2021 Creatinine 1.06, BUN 32, Potassium 3.6, Sodium 143, GFR >60  ?09/20/2021 Creatinine 0.83, BUN 32, Potassium 3.7, Sodium 142, GFR >60 ?A complete set of results can be found in Results Review. ?  ?Recommendations:  Pt seen in LVAD clinic 4/24 and recommendations were given at that time.  Copy sent to Dr. [name] for [facility] and to ask if ICM should  continue monthly since discrepancy in Optiovl reading and pt is euvolemic condition since receiving LVAD.    ?  ?Follow-up plan: ICM clinic phone appointment on pending Dr Claris Gladden recommendation if ICM is beneficial for monthly fluid level monitoring .   91 day device clinic remote transmission 12/16/2021.       ?  ?EP/Cardiology Office Visits: 10/28/2021 with LVAD clinic.   11/26/2021 with Dr [name]. ?  ?Copy of ICM check sent to Dr. [name].    ? ?3 month ICM trend: 10/14/2021. ? ? ? ?12-14 Month ICM trend:  ? ? ? ?[name], RN ?10/15/2021 ?9:20 AM ? ?

## 2021-10-15 NOTE — Progress Notes (Signed)
Larey Dresser, MD  Saheed Carrington Panda, RN ?Can stop for now.  ?

## 2021-10-15 NOTE — Progress Notes (Signed)
? ?Virtual Visit via Video Note  ? ?This visit type was conducted due to national recommendations for restrictions regarding the COVID-19 Pandemic (e.g. social distancing) in an effort to limit this patient's exposure and mitigate transmission in our community.  Due to her co-morbid illnesses, this patient is at least at moderate risk for complications without adequate follow up.  This format is felt to be most appropriate for this patient at this time.  All issues noted in this document were discussed and addressed.  A limited physical exam was performed with this format.  Please refer to the patient's chart for her consent to telehealth for [facility]. ? ?Date:  10/16/2021  ? ?ID:  [name], DOB July 10, 1970, MRN [id number] ?The patient was identified using 2 identifiers. ? ?Patient Location: Home ?Provider Location: Office/Clinic ? ?PCP:  Patient, No Pcp Per (Inactive)  ?Cardiologist:  [name], MD  ?Electrophysiologist:  Virl Axe, MD  ? ?Evaluation Performed:  Follow-Up Visit ? ?Chief Complaint:  OSA ? ?History of Present Illness:   ? ?[name] is a 51 y.o. female with hx of HTN, chronic systolic CHF, morbid obesity and hx of upper airway resistance syndrome with HST in 2015 with AHI 4/hr. due to excessive daytime sleepiness and loud snoring she underwent repeat home sleep study which showed severe OSA with an AHI of 30.3/hr with nadir O2 sat 85%.  She was started on auto CPAP from 4 to 20cm H2O. ? ?At her last office visit she was complaining that occasionally she would not get enough air.  At that time I increased her auto CPAP to 5 to 20 cm H2O.  She is now here for follow-up. ? ?She was using her device before she went into the hospital and got an LVAD and then when she got home she was sleeping so well she thought she did not need it anymore.  She sleeps well at night and does not feel tired in the am.  She does still have to nap during the day.   ? ?The patient does not have symptoms  concerning for COVID-19 infection (fever, chills, cough, or new shortness of breath).  ? ?Past Medical History:  ?Diagnosis Date  ? Automatic implantable cardioverter-defibrillator in situ   ? Chronic CHF (congestive heart failure) (HCC)   ? a. EF 15-20% b. RHC (09/2013) RA 14, RV 57/22, PA 64/36 (48), PCWP 18, FIck CO/CI 3.7/1.6, PVR 8.1 WU, PA sat 47%   ? History of hypothyroidism   ? History of stomach ulcers   ? Hyperthyroidism   ? Hypotension   ? Morbid obesity ([place])   ? Myocardial infarction [facility]) 08/2013  ? Nocturnal dyspnea   ? Nonischemic cardiomyopathy ([place])   ? Sinus tachycardia   ? Sleep apnea   ? Snoring-prob OSA 09/04/2011  ? Sprint Fidelis ICD lead RECALL  [phone]   ? Stroke [facility]) 2021  ? some residual right-sided weakness  ? UARS (upper airway resistance syndrome) 09/04/2011  ? HST 12/2013:  AHI 4/hr (numerous episodes of airflow reduction that did not have concomitant desaturation)   ? ?Past Surgical History:  ?Procedure Laterality Date  ? BREATH TEK H PYLORI N/A 11/09/2014  ? Procedure: BREATH TEK H PYLORI;  Surgeon: Greer Pickerel, MD;  Location: [facility];  Service: General;  Laterality: N/A;  ? CARDIAC CATHETERIZATION  ~ 2006; 09/2013  ? CARDIAC CATHETERIZATION N/A 05/23/2016  ? Procedure: Right Heart Cath;  Surgeon: Larey Dresser, MD;  Location: [place] [facility];  Service: Cardiovascular;  Laterality: N/A;  ? CARDIAC DEFIBRILLATOR PLACEMENT  2006; 12/26/2013  ? Medtronic [id number]; 12-2013 ICD gen change and RV lead revision with new 6935 RV lead by Dr [name]  ? CENTRAL LINE INSERTION  08/28/2021  ? Procedure: CENTRAL LINE INSERTION;  Surgeon: Larey Dresser, MD;  Location: [place] [facility];  Service: Cardiovascular;;  ? [place]  ? IMPLANTABLE CARDIOVERTER DEFIBRILLATOR GENERATOR CHANGE N/A 12/26/2013  ? Procedure: IMPLANTABLE CARDIOVERTER DEFIBRILLATOR GENERATOR CHANGE;  Surgeon: Deboraha Sprang, MD;  Location: [facility] CATH LAB;  Service: Cardiovascular;  Laterality: N/A;  ? INSERTION  OF IMPLANTABLE LEFT VENTRICULAR ASSIST DEVICE N/A 09/03/2021  ? Procedure: INSERTION OF IMPLANTABLE LEFT VENTRICULAR ASSIST DEVICE/ Heartmate 3;  Surgeon: Dahlia Byes, MD;  Location: [place];  Service: Open Heart Surgery;  Laterality: N/A;  ? IR CT HEAD LTD  04/17/2020  ? IR CT HEAD LTD  04/17/2020  ? IR INTRA CRAN STENT  04/17/2020  ? IR PERCUTANEOUS ART THROMBECTOMY/INFUSION INTRACRANIAL INC DIAG ANGIO  04/17/2020  ? IR RADIOLOGIST EVAL & MGMT  06/05/2020  ? LEAD REVISION N/A 12/26/2013  ? Procedure: LEAD REVISION;  Surgeon: Deboraha Sprang, MD;  Location: [facility] C[facility] LAB;  Service: Cardiovascular;  Laterality: N/A;  ? PLACEMENT OF IMPELLA LEFT VENTRICULAR ASSIST DEVICE Right 08/29/2021  ? Procedure: PLACEMENT OF IMPELLA 5.5 LEFT VENTRICULAR ASSIST DEVICE;  Surgeon: Gaye Pollack, MD;  Location: [place];  Service: Open Heart Surgery;  Laterality: Right;  ? RADIOLOGY WITH ANESTHESIA N/A 04/17/2020  ? Procedure: Code Stroke;  Surgeon: Luanne Bras, MD;  Location: [place];  Service: Radiology;  Laterality: N/A;  ? REMOVAL OF IMPELLA LEFT VENTRICULAR ASSIST DEVICE N/A 09/03/2021  ? Procedure: REMOVAL OF IMPELLA LEFT VENTRICULAR ASSIST DEVICE;  Surgeon: Dahlia Byes, MD;  Location: [place];  Service: Open Heart Surgery;  Laterality: N/A;  ? RIGHT HEART CATH N/A 08/28/2021  ? Procedure: RIGHT HEART CATH;  Surgeon: Larey Dresser, MD;  Location: [place] [facility];  Service: Cardiovascular;  Laterality: N/A;  ? RIGHT HEART CATHETERIZATION N/A 02/15/2014  ? Procedure: RIGHT HEART CATH;  Surgeon: Larey Dresser, MD;  Location: [facility] CATH LAB;  Service: Cardiovascular;  Laterality: N/A;  ? TEE WITHOUT CARDIOVERSION N/A 08/29/2021  ? Procedure: TRANSESOPHAGEAL ECHOCARDIOGRAM (TEE);  Surgeon: Gaye Pollack, MD;  Location: [place];  Service: Open Heart Surgery;  Laterality: N/A;  ? TEE WITHOUT CARDIOVERSION N/A 09/03/2021  ? Procedure: TRANSESOPHAGEAL ECHOCARDIOGRAM (TEE);  Surgeon: Dahlia Byes, MD;  Location: [place];  Service: Open  Heart Surgery;  Laterality: N/A;  ? TUBAL LIGATION  1999  ?  ? ?Current Meds  ?Medication Sig  ? amiodarone (PACERONE) 200 MG tablet Take 0.5 tablets (100 mg total) by mouth daily. Take 1 tablet (200 mg) by mouth daily  ? aspirin EC 81 MG tablet Take 1 tablet (81 mg total) by mouth daily. Swallow whole.  ? digoxin (LANOXIN) 0.125 MG tablet Take 1/2 tablet (0.0625 mg total) by mouth daily.  ? enoxaparin (LOVENOX) 40 MG/0.4ML injection Inject 0.4 mLs (40 mg total) into the skin every 12 (twelve) hours.  ? methimazole (TAPAZOLE) 10 MG tablet Take 1 tablet (10 mg total) by mouth 2 (two) times daily.  ? pantoprazole (PROTONIX) 40 MG tablet Take 1 tablet (40 mg total) by mouth daily.  ? potassium chloride SA (KLOR-CON M) 20 MEQ tablet Take 3 tablets (60 mEq total) by mouth 2 (two) times daily.  ? predniSONE (DELTASONE) 10  MG tablet Take 1.5 tablets (15 mg total) by mouth daily with breakfast.  ? quiniDINE gluconate 324 MG CR tablet Take 1 tablet (324 mg total) by mouth 2 (two) times daily.  ? rosuvastatin (CRESTOR) 40 MG tablet Take 1 tablet (40 mg total) by mouth at bedtime.  ? sacubitril-valsartan (ENTRESTO) 24-26 MG Take 1 tablet by mouth 2 (two) times daily.  ? sildenafil (REVATIO) 20 MG tablet Take 1 tablet (20 mg total) by mouth 3 (three) times daily.  ? spironolactone (ALDACTONE) 25 MG tablet Take 0.5 tablets (12.5 mg total) by mouth daily.  ? torsemide (DEMADEX) 20 MG tablet Take 2 tablets (40 mg total) by mouth daily.  ? warfarin (COUMADIN) 1 MG tablet Take 2 mg (2 tab) every day or as directed by HF Clinic  ? zinc gluconate 50 MG tablet Take 1 tablet (50 mg total) by mouth daily.  ?  ? ?Allergies:   Patient has no known allergies.  ? ?Social History  ? ?Tobacco Use  ? Smoking status: Never  ? Smokeless tobacco: Never  ?Vaping Use  ? Vaping Use: Never used  ?Substance Use Topics  ? Alcohol use: No  ? Drug use: No  ?  ? ?Family Hx: ?The patient's family history includes Heart disease in her maternal grandmother  and mother; High blood pressure in her father; Obesity in her mother; Stroke in her father. There is no history of Colon cancer, Breast cancer, Ovarian cancer, Endometrial cancer, Pancreatic cancer, or Pros

## 2021-10-15 NOTE — Progress Notes (Signed)
Spoke with patient and advised Dr [name] recommended to stop ICM monthly follow up at this time.  Explained per device clinic protocol, device will continued to be checked every 91 days.   Encouraged to call if any assistance is needed in the future.   ?

## 2021-10-16 ENCOUNTER — Telehealth (INDEPENDENT_AMBULATORY_CARE_PROVIDER_SITE_OTHER): Payer: BC Managed Care – PPO | Admitting: Cardiology

## 2021-10-16 ENCOUNTER — Encounter: Payer: Self-pay | Admitting: Cardiology

## 2021-10-16 VITALS — Ht 63.0 in | Wt 218.0 lb

## 2021-10-16 DIAGNOSIS — G4733 Obstructive sleep apnea (adult) (pediatric): Secondary | ICD-10-CM | POA: Diagnosis not present

## 2021-10-16 DIAGNOSIS — I6602 Occlusion and stenosis of left middle cerebral artery: Secondary | ICD-10-CM

## 2021-10-16 NOTE — Patient Instructions (Signed)
Medication Instructions:  ?Your physician recommends that you continue on your current medications as directed. Please refer to the Current Medication list given to you today. ? ?*If you need a refill on your cardiac medications before your next appointment, please call your pharmacy* ? ?Testing/Procedures: ?Your physician has recommended that you have a sleep study. This test records several body functions during sleep, including: brain activity, eye movement, oxygen and carbon dioxide blood levels, heart rate and rhythm, breathing rate and rhythm, the flow of air through your mouth and nose, snoring, body muscle movements, and chest and belly movement. ? ?Follow-Up: ?At [facility], you and your health needs are our priority.  As part of our continuing mission to provide you with exceptional heart care, we have created designated Provider Care Teams.  These Care Teams include your primary Cardiologist (physician) and Advanced Practice Providers (APPs -  Physician Assistants and Nurse Practitioners) who all work together to provide you with the care you need, when you need it. ? ?Your next appointment:   ?8 week(s) after your sleep study ? ?The format for your next appointment:   ?Virtual Visit  ? ?Provider:  ?Fransico Him, MD  ? ? ? ?Important Information About Sugar ? ? ? ? ?  ?

## 2021-10-16 NOTE — Addendum Note (Signed)
Addended by: Antonieta Iba on: 10/16/2021 10:12 AM ? ? Modules accepted: Orders ? ?

## 2021-10-17 ENCOUNTER — Telehealth: Payer: Self-pay | Admitting: *Deleted

## 2021-10-17 DIAGNOSIS — G4733 Obstructive sleep apnea (adult) (pediatric): Secondary | ICD-10-CM

## 2021-10-17 NOTE — Telephone Encounter (Signed)
Prior Authorization for HOME SLEEP TEST sent to [facility] via web portal.  ?Order ID: [id number] ?Authorized ?Approval Valid Through:10/17/2021 - 12/15/2021.  ?

## 2021-10-18 ENCOUNTER — Ambulatory Visit (HOSPITAL_COMMUNITY)
Admission: RE | Admit: 2021-10-18 | Discharge: 2021-10-18 | Disposition: A | Discharge status: Home / Self Care | Payer: BC Managed Care – PPO | Source: Ambulatory Visit | Attending: Internal Medicine | Admitting: Internal Medicine

## 2021-10-18 ENCOUNTER — Ambulatory Visit (HOSPITAL_COMMUNITY): Payer: Self-pay | Admitting: Pharmacist

## 2021-10-18 ENCOUNTER — Other Ambulatory Visit (HOSPITAL_COMMUNITY): Payer: Self-pay | Admitting: *Deleted

## 2021-10-18 DIAGNOSIS — Z5181 Encounter for therapeutic drug level monitoring: Secondary | ICD-10-CM | POA: Diagnosis not present

## 2021-10-18 DIAGNOSIS — Z95811 Presence of heart assist device: Secondary | ICD-10-CM | POA: Insufficient documentation

## 2021-10-18 DIAGNOSIS — Z7901 Long term (current) use of anticoagulants: Secondary | ICD-10-CM

## 2021-10-18 LAB — PROTIME-INR
INR: 1.5 — ABNORMAL HIGH (ref 0.8–1.2)
Prothrombin Time: 18.3 seconds — ABNORMAL HIGH (ref 11.4–15.2)

## 2021-10-18 NOTE — Progress Notes (Signed)
LVAD INR 

## 2021-10-21 ENCOUNTER — Ambulatory Visit: Payer: Medicare Other | Admitting: Internal Medicine

## 2021-10-22 ENCOUNTER — Ambulatory Visit (HOSPITAL_COMMUNITY): Payer: Self-pay | Admitting: Pharmacist

## 2021-10-22 ENCOUNTER — Ambulatory Visit (HOSPITAL_COMMUNITY)
Admission: RE | Admit: 2021-10-22 | Discharge: 2021-10-22 | Disposition: A | Discharge status: Home / Self Care | Payer: BC Managed Care – PPO | Source: Ambulatory Visit | Attending: Internal Medicine | Admitting: Internal Medicine

## 2021-10-22 DIAGNOSIS — Z7901 Long term (current) use of anticoagulants: Secondary | ICD-10-CM | POA: Diagnosis not present

## 2021-10-22 DIAGNOSIS — Z95811 Presence of heart assist device: Secondary | ICD-10-CM | POA: Insufficient documentation

## 2021-10-22 LAB — PROTIME-INR
INR: 2.3 — ABNORMAL HIGH (ref 0.8–1.2)
Prothrombin Time: 25.4 seconds — ABNORMAL HIGH (ref 11.4–15.2)

## 2021-10-22 NOTE — Progress Notes (Signed)
LVAD INR 

## 2021-10-24 ENCOUNTER — Ambulatory Visit (HOSPITAL_COMMUNITY): Payer: Self-pay | Admitting: Pharmacist

## 2021-10-24 ENCOUNTER — Ambulatory Visit: Payer: BC Managed Care – PPO | Admitting: Gynecologic Oncology

## 2021-10-24 LAB — POCT INR: INR: 2.3 (ref 2.0–3.0)

## 2021-10-24 NOTE — Progress Notes (Signed)
LVAD INR 

## 2021-10-25 ENCOUNTER — Other Ambulatory Visit (HOSPITAL_COMMUNITY): Payer: Self-pay | Admitting: Unknown Physician Specialty

## 2021-10-25 DIAGNOSIS — Z7901 Long term (current) use of anticoagulants: Secondary | ICD-10-CM

## 2021-10-25 DIAGNOSIS — Z95811 Presence of heart assist device: Secondary | ICD-10-CM

## 2021-10-26 DIAGNOSIS — R5381 Other malaise: Secondary | ICD-10-CM | POA: Diagnosis not present

## 2021-10-28 ENCOUNTER — Ambulatory Visit (HOSPITAL_COMMUNITY)
Admission: RE | Admit: 2021-10-28 | Discharge: 2021-10-28 | Disposition: A | Discharge status: Home / Self Care | Payer: BC Managed Care – PPO | Source: Ambulatory Visit | Attending: Internal Medicine | Admitting: Internal Medicine

## 2021-10-28 ENCOUNTER — Ambulatory Visit (HOSPITAL_COMMUNITY): Payer: Self-pay | Admitting: Pharmacist

## 2021-10-28 ENCOUNTER — Encounter (HOSPITAL_COMMUNITY): Payer: Self-pay

## 2021-10-28 VITALS — BP 120/0 | HR 89 | Wt 225.0 lb

## 2021-10-28 DIAGNOSIS — I5022 Chronic systolic (congestive) heart failure: Secondary | ICD-10-CM | POA: Diagnosis not present

## 2021-10-28 DIAGNOSIS — Z6839 Body mass index (BMI) 39.0-39.9, adult: Secondary | ICD-10-CM | POA: Diagnosis not present

## 2021-10-28 DIAGNOSIS — Z95810 Presence of automatic (implantable) cardiac defibrillator: Secondary | ICD-10-CM | POA: Diagnosis not present

## 2021-10-28 DIAGNOSIS — Z7901 Long term (current) use of anticoagulants: Secondary | ICD-10-CM | POA: Diagnosis not present

## 2021-10-28 DIAGNOSIS — Z95811 Presence of heart assist device: Secondary | ICD-10-CM | POA: Diagnosis not present

## 2021-10-28 DIAGNOSIS — I428 Other cardiomyopathies: Secondary | ICD-10-CM | POA: Diagnosis not present

## 2021-10-28 DIAGNOSIS — I130 Hypertensive heart and chronic kidney disease with heart failure and stage 1 through stage 4 chronic kidney disease, or unspecified chronic kidney disease: Secondary | ICD-10-CM | POA: Diagnosis not present

## 2021-10-28 LAB — CBC
HCT: 39.2 % (ref 36.0–46.0)
Hemoglobin: 11.8 g/dL — ABNORMAL LOW (ref 12.0–15.0)
MCH: 26.2 pg (ref 26.0–34.0)
MCHC: 30.1 g/dL (ref 30.0–36.0)
MCV: 87.1 fL (ref 80.0–100.0)
Platelets: 327 10*3/uL (ref 150–400)
RBC: 4.5 MIL/uL (ref 3.87–5.11)
RDW: 17.1 % — ABNORMAL HIGH (ref 11.5–15.5)
WBC: 10.2 10*3/uL (ref 4.0–10.5)
nRBC: 0 % (ref 0.0–0.2)

## 2021-10-28 LAB — BASIC METABOLIC PANEL
Anion gap: 11 (ref 5–15)
BUN: 8 mg/dL (ref 6–20)
CO2: 25 mmol/L (ref 22–32)
Calcium: 9.3 mg/dL (ref 8.9–10.3)
Chloride: 102 mmol/L (ref 98–111)
Creatinine, Ser: 0.85 mg/dL (ref 0.44–1.00)
GFR, Estimated: >60 mL/min (ref >60)
Glucose, Bld: 84 mg/dL (ref 70–99)
Potassium: 3.3 mmol/L — ABNORMAL LOW (ref 3.5–5.1)
Sodium: 138 mmol/L (ref 135–145)

## 2021-10-28 LAB — PROTIME-INR
INR: 2.5 — ABNORMAL HIGH (ref 0.8–1.2)
Prothrombin Time: 26.4 seconds — ABNORMAL HIGH (ref 11.4–15.2)

## 2021-10-28 LAB — T4, FREE: Free T4: 2.53 ng/dL — ABNORMAL HIGH (ref 0.61–1.12)

## 2021-10-28 LAB — LACTATE DEHYDROGENASE: LDH: 188 U/L (ref 98–192)

## 2021-10-28 LAB — TSH: TSH: 0.013 u[IU]/mL — ABNORMAL LOW (ref 0.350–4.500)

## 2021-10-28 LAB — POCT INR: INR: 2.5 (ref 2.0–3.0)

## 2021-10-28 MED ORDER — SILDENAFIL CITRATE 20 MG PO TABS: 20.0000 mg | ORAL_TABLET | Freq: Three times a day (TID) | ORAL | 6 refills | Status: DC

## 2021-10-28 NOTE — Patient Instructions (Addendum)
Stop Amiodarone ?Coumadin dosing per Ander Purpura PharmD ?Return to [place] clinic in 1 month  ?Continue twice weekly drive line dressing changes without silver strip. Call VAD coordinators Monday and let us know how your site is looking.  ?

## 2021-10-28 NOTE — Progress Notes (Signed)
LVAD INR 

## 2021-10-28 NOTE — Progress Notes (Addendum)
Patient presents for 2 week f/u up in [facility] today with her husband [name]. Reports no problems with VAD equipment or concerns with drive line. ? ?Pt states that she is feeling great. She is started to get out and walk some outside. She is increasing activity. Denies dizziness, shortness of breath, heart failure symptoms, and signs of bleeding. Reports occasional lightheadedness with position changes since starting Entresto. She thinks this is due to her moving too fast. Orthostatic vitals obtained; see documentation below.  ? ?Pt confirmed that she decreased to Amiodarone 100 mg daily and started Entresto 24-26 mg BID. She also decreased Torsemide to 20 mg daily herself. Fluid status looks good today. BP improved today. No changes to Entresto dosing today. Per Dr [name] will stop Amiodarone today in hopes this will improve thyroid function. Pt to call if short of breath, palpations, or VAD alarms occur. She verbalized understanding.  ? ?Pt has seen Endo and OB/GYN. Endo increased prednisone at last visit. Will check TSH, T4, and T3 today for endo office. 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 Dr [name] informed the pt that we would need to wait 6 months prior to her having surgery this date would be 03/06/22. ? ?Scheduled for home sleep study 12/10/21 per Dr [name]. Will reach out to their office to see if this can be completed sooner per Dr [name].  ? ?Pt has not heard from cardiac rehab regarding start date. I will reach out to them today.  ? ?Drive line dressing change completed twice weekly at home per [place]. Dressing change today in clinic. See documentation below. Silver strip appears to be irritating skin, and pulling away fresh granulation tissue with removal. Will trial no silver strip at this time. [name] to call clinic on Monday to update VAD coordinators on exit site progress.  ? ?Vital Signs:  ?Doppler Pressure: 120  ?Automatc BP:  ? Laying: 108/70 (87) HR 89  Speed: 5800 Flow: 4.7 Power:  4.7 w PI: 3.1 ? Sitting: 102/61 (78)  HR 78  Speed: 5800 Flow: 4.6 Power: 4.5 w PI: 3.4 ? Standing: 103/71 (82)  HR 94  Speed: 5800 Flow: 4.7 Power: 4.7 w PI: 3.2 ?HR: 89 SR  ?SPO2: 97% on RA ?  ?Weight: 225 lbs w/o eqt ?Last weight: 226.2 lb w/o eqt ?Home weights: 222 lbs ? ?VAD Indication: ?Destination Therapy d/t BMI ?  ?VAD interrogation & Equipment Management: ?Speed: 5800 ?Flow: 4.7 ?Power: 4.6 w    ?PI: 3.3 ?  ?Alarms: no clinical alarms ?Events: 5-10 daily; isolated 5/5- 35 PI events ? ?Fixed speed 5800 ?Low speed limit: 5500 ?  ?Primary Controller:  Replace back up battery in 29 months. ?Back up controller:   Replace back up battery in 30 months. ?  ?Annual Equipment Maintenance on UBC/PM was performed on 09/03/21.  ?  ?I reviewed the LVAD parameters from today and compared the results to the patient's prior recorded data. LVAD interrogation was NEGATIVE for significant power changes, NEGATIVE for clinical alarms and STABLE for PI events/speed drops. No programming changes were made and pump is functioning within specified parameters. Pt is performing daily controller and system monitor self tests along with completing weekly and monthly maintenance for LVAD equipment. ?  ?LVAD equipment check completed and is in good working order. Back-up equipment present. Charged back up battery and performed self-test on equipment.  ?  ?Exit Site Care: ?Drive line is being maintained daily by her husband [name]. Existing VAD dressing removed  and site care performed using sterile technique. Drive line exit site cleaned with Chlora prep applicators x 2, Cellurate applied to exit site, allowed to dry, and gauze dressing WITHOUT Silver strip applied. Exit site healing and unincorporated, the velour is fully implanted at exit site. Small amount of serosanguinous drainage. No redness, tenderness, foul odor or rash noted. Drive line anchor re-applied. Pt denies fever or chills. Continue twice a week dressing changes without  silver strip. Pt has adequate supplies for home use. ? ? ? ? ?Significant Events on VAD Support:  ? ? ?Device: Medtronic ?Therapies: on VF ?240 CT 200-240 ?VVI: 40 ?Last check: 09/18/21 ? ? ?BP & Labs:  ?Doppler 120 - not correlating with cuff ?  ?Hgb 11.8 - No S/S of bleeding. Specifically denies melena/BRBPR or nosebleeds. ?  ?LDH stable at 244 with established baseline of 250 - 450. Denies tea-colored urine. No power elevations noted on interrogation.  ? ?Plan:  ?Stop Amiodarone ?Coumadin dosing per Ander Purpura PharmD ?Return to [place] clinic in 1 month  ?Continue twice weekly drive line dressing changes without silver strip. Call VAD coordinators Monday and let us know how your site is looking.  ? ?[name] RN ?VAD Coordinator  ?Office: [phone]  ?24/7 Pager: [phone]  ? ? ?  ?ID:  [name], DOB Dec 02, 1970, MRN [id number]    ?Provider location: [place] [facility] ?Type of Visit: Established patient  ?  ?PCP:  [name], MD    ?Cardiologist:  [name], MD ? ?History of Present Illness: ?[name] is a 51 y.o. 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 ? ?She was followed for systolic CHF initially in [place]. Apparently her EF recovered to 35-40%. However, she was admitted to [facility] 09/19/13 for worsening dyspnea and CP. Coronaries were reportedly OK on LHC at that time at [facility]. Echo showed EF of 15-20% with four-chamber enlargement with moderate-severe TR/MR. RHC as below. Rheumatological w/u negative.  CPX in 9/16 showed near-normal functional capacity.  Repeat echo in 6/17 shows that EF remains 20%. Repeat CPX in 1/18 was submaximal but probably only mild circulatory limitation.  Echo 8/18 showed EF 25-30%, severe LV dilation.  CPX in 6/19 showed low normal functional capacity.  Echo in 10/19 showed EF 20-25%, moderate LV dilation.  ?  ?Zio patch in 2/20 with 11%  PVCs and NSVT, amiodarone started. Zio patch in 3/20 with 13.3% PVCs and NSVT.  She saw Dr. [name], he thought that increased PVCs were electromechanical in nature due to worsening heart failure.  Repeat Zio patch on amiodarone in 1/21 showed 6.2% PVCs.  ? ?She was admitted in 4/20 with VT in setting of hypokalemia.   ? ?CPX in 9/20 showed mild functional limitation, more due to body habitus than heart failure.  Echo in 4/21 showed EF 20% with severe LV dilation, diffuse hypokinesis, normal RV size and systolic function.  ? ?In 8/21, she had a shock for VF in the setting of hypokalemia.  ? ?Patient was admitted in 10/21 with CVA involving left MCA territory.  This was thought to be cardioembolic but LV thrombus was not visualized and no atrial fibrillation has been seen.  Echo in 10/21 showed EF 20-25%, severe LV dilation (no LV thrombus seen), mildly decreased RV systolic function, mild-moderate MR. She was taken by IR for thrombectomy and stent placement, now on ASA 81 and ticagrelor.  ? ?  She had recurrent VT in 2/22, quinidine was added by EP.  Spironolactone and Coreg were decreased due to low BP and lightheadedness.   ? ?Echo in 4/22 showed EF 25% with severe LV dilation and global hypokinesis, normal RV, mild-moderate MR.  ? ?CPX (8/22) showed peak VO2 14.3, VE/VCO2 slope 42, RER 1.09 => mild to moderate HF limitation, elevated VE/VCO2 slope is likely false from end exercise hyperventilation. Restrictive lung physiology from obesity.  ? ?Patient was admitted in 3/23 with recurrent CHF exacerbation with low cardiac output.  She was also noted to be hyperthyroidism but amiodarone had to be continued due to VT and poorly tolerated atrial fibrillation.  She required Impella 5.5 placement and eventual HM3 LVAD placement on 09/04/21.  Post-op course complicated by atrial fibrillation, malnutrition, and deconditioning.  She went to CIR prior to discharge.  ? ?She returns for followup of CHF and LVAD.  Weight down 1 lb.   She is doing well symptomatically.  Mild orthostatic symptoms when she stands up too fast, not orthostatic today when checked in the office.  No dyspnea walking on flat ground, walked into office today wit

## 2021-10-29 ENCOUNTER — Other Ambulatory Visit: Payer: Self-pay | Admitting: Internal Medicine

## 2021-10-29 ENCOUNTER — Other Ambulatory Visit (HOSPITAL_COMMUNITY): Payer: Self-pay

## 2021-10-29 LAB — T3, FREE: T3, Free: 3.6 pg/mL (ref 2.0–4.4)

## 2021-10-29 MED ORDER — METHIMAZOLE 10 MG PO TABS: 10.0000 mg | ORAL_TABLET | Freq: Three times a day (TID) | ORAL | 1 refills | Status: DC

## 2021-10-30 ENCOUNTER — Telehealth: Payer: Self-pay

## 2021-10-30 ENCOUNTER — Other Ambulatory Visit: Payer: BC Managed Care – PPO

## 2021-10-30 NOTE — Telephone Encounter (Signed)
Patient advised and states that she has been taking her medication 2 times daily and will start taking 3 times daily  ?

## 2021-10-30 NOTE — Telephone Encounter (Signed)
-----   Message from Cloyd Stagers, MD sent at 10/29/2021  1:52 PM EDT ----- ?Pease let the pt know that cardiology reached out to me regarding her thyroid which is worse. Please make sure the pt has been taking methimazole twice daily . Needs to INCREASE to THREE times daily  ?----- Message ----- ?From: [name], RN ?Sent: 10/29/2021   8:36 AM EDT ?To: Cloyd Stagers, MD ? ?Hello, ? ?I saw [name] in LVAD clinic yesterday with Dr [name]. Attached are her thyroid labs. She has follow up scheduled with you in July. Unsure if she needs to be seen sooner.  ? ?Thanks, ? ?[name] RN ?VAD Coordinator  ?Office: [phone]  ?24/7 Pager: [phone]  ? ? ?

## 2021-11-01 ENCOUNTER — Encounter: Payer: BC Managed Care – PPO | Attending: Cardiology | Admitting: *Deleted

## 2021-11-01 ENCOUNTER — Ambulatory Visit (HOSPITAL_COMMUNITY): Payer: Self-pay | Admitting: Pharmacist

## 2021-11-01 ENCOUNTER — Encounter: Payer: Self-pay | Admitting: *Deleted

## 2021-11-01 DIAGNOSIS — I5042 Chronic combined systolic (congestive) and diastolic (congestive) heart failure: Secondary | ICD-10-CM

## 2021-11-01 DIAGNOSIS — Z95811 Presence of heart assist device: Secondary | ICD-10-CM

## 2021-11-01 LAB — POCT INR: INR: 2.4 (ref 2.0–3.0)

## 2021-11-01 NOTE — Progress Notes (Signed)
LVAD INR 

## 2021-11-01 NOTE — Progress Notes (Signed)
Virtual orientation call completed today. shehas an appointment on Date: 11/13/2021  for EP eval and gym Orientation.  Documentation of diagnosis can be found in [facility] Date: 08/25/2021 .   ?

## 2021-11-04 ENCOUNTER — Encounter: Payer: Self-pay | Admitting: Adult Health

## 2021-11-04 ENCOUNTER — Ambulatory Visit (INDEPENDENT_AMBULATORY_CARE_PROVIDER_SITE_OTHER): Payer: BC Managed Care – PPO | Admitting: Adult Health

## 2021-11-04 VITALS — HR 82 | Ht 63.0 in | Wt 227.2 lb

## 2021-11-04 DIAGNOSIS — I63312 Cerebral infarction due to thrombosis of left middle cerebral artery: Secondary | ICD-10-CM

## 2021-11-04 NOTE — Progress Notes (Signed)
?[facility] ?[address] ?[place]. [place] [address] ?[phone] ? ?     STROKE FOLLOW UP NOTE ? ?[name] November ?Date of Birth:  1970/12/19 ?Medical Record Number:  [id number]  ? ?Reason for Referral: stroke follow up ? ? ? ?SUBJECTIVE: ? ? ?CHIEF COMPLAINT:  ?Chief Complaint  ?Patient presents with  ? Follow-up  ?  Rm 3 with husband [name]) here for 6 month f/u. Pt reports over all doing well. Reports she is scheduled for sleep study through cardiology next month- has not been using CPAP ( trouble with pressure)  ? ? ? ?HPI:  ? ?Update 11/04/2021 JM: Patient returns for 68-monthstroke follow-up accompanied by her husband, JRoselyn Clark  Overall stable from stroke standpoint without new stroke/TIA symptoms.  Reports minimal/rare right hand numbness and some mild right hand fine motor control difficulty.  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  Brilinta cleared to discontinue by IR and was started on warfarin in addition to aspirin.  She is closely being followed by cardiology and Coumadin clinic.  Reports tolerating warfarin and aspirin without side effects as well as Crestor without side effects.  Systolic blood pressure 94.  She is scheduled for repeat sleep study next month through cardiology, not currently using CPAP as she feels she is sleeping and breathing better - she questions if she still has sleep apnea.  Plans on follow-up with Dr. DEstanislado Pandyin June/July for repeat CTA.  No new neurological concerns at this time. ? ? ? ?History provided for reference purposes only ?Update 04/29/2021 JM: Returns for 664-monthtroke follow-up unaccompanied ? ?Overall stable without new stroke/TIA symptoms. ?Residual occasional right hand finger tips tingling/numbness and weakness (at times, difficulty opening bottles/jars). Denies residual numbness/tingling into right leg.  ? ?Compliant on aspirin,  Brilinta and atorvastatin -denies side effects ?Blood pressure today 109/76 ?Has not been using CPAP over the past couple of months due to difficulty tolerating pressure - is followed by Dr. TuRadford Pax plans to schedule f/u visit as prior visit approx 1 year ago ?She does not currently have an established PCP - is planning on looking to establish care  ? ?Routinely follows with cardiology for CHF monitoring/management ? ?F/u with IR Dr. DeEstanislado Pandyrecent CTA head/neck showed patent L MCA stent - plans to repeat in 6 months - advised to continue Brilinta until repeat scan ? ?No new/further concerns at this time ? ?Update 10/29/2020 JM: [name] for 3-38-monthroke follow-up accompanied by her husband, JamRoselyn Clark?Reports residual right leg numbness/tingling with occasional painful sensation at nighttime which at times can interfere with her sleep.  Mild decreased right hand dexterity and slight numbness gradually improving.  She has been taking Tylenol PM with increased pain as well as massage and use of compression with benefit. ?Denies new stroke/TIA symptoms ? ?Remains on aspirin and Brilinta as well as atorvastatin without associated side effects ?Blood pressure today 95/66 -routinely monitors at home and typically runs on lower side ?Use of CPAP nightly for OSA management  ? ?Has since had follow-up with cardiology with recent medication adjustments as well as IR with stable CTA with plans on repeating around September and per pt, IR advised ongoing use of Brilinta until that time ? ?No further concerns at this time ? ? ?Initial visit 07/31/2020 JM: [name] being seen for hospital follow-up from admission in 03/2020 accompanied by her husband.  She reports residual decreased right hand dexterity and tingling right hand and foot.  At discharge, she initially had diminished sensation on right side but this has been slowly improving.  More recently, she will experience pins-and-needles sensation more at  nighttime. She will use a compression sock with benefit.   She has not participated in any therapies as she reports being told that therapy was not needed.   Denies new or worsening stroke/TIA symptoms.  She has remained on aspirin and Brilinta without bleeding or bruising.  Remains on atorvastatin 40 mg daily without myalgias.  Blood pressure today 106/71.  Reports 1 syncopal event which sounds to be hypotension related and has not experienced any additional events since cardiology lowered Entresto dosage.  Reports compliance with CPAP for sleep apnea management.  She did have follow-up with Dr. Estanislado Pandy who plans on repeating CTA around this time -she reports calling office and leaving voicemail to schedule imaging 1-2 weeks ago but has not received return call back to schedule.  No further concerns at this time. ? ?Stroke admission 04/17/2020 ?[name] is a 51 y.o. female with a history of CHF with an EF of 15 to 20% who presented to [facility] ED on 04/17/2020 with right-sided weakness.  Personally reviewed hospitalization pertinent progress notes, lab work and imaging with summary provided.  Evaluated by Dr. Leonie Man with CT showing subtle hypodensity of the left [place] and evidence of left M1 occlusion s/p mechanical thrombectomy and stent.  Repeat CTA showed stent with nonocclusive thrombus along the posterior wall mid stent.  Unable to obtain MRI d/t ICD.  2D echo showed EF 20 to 25%.  ICD interrogated which did not show evidence of atrial fibrillation.  She was placed on Brilinta 90 mg twice daily and aspirin 81 mg daily for secondary stroke prevention.  She was advised to follow-up outpatient with Dr. Estanislado Pandy. 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  History of HLD on atorvastatin 40 mg daily with LDL 127.  No prior history or evidence of DM with A1c 5.3.  Other stroke risk factors include hx of substance abuse, obesity, family history of stroke, CAD/MI, OSA on CPAP and systolic CHF.  Evaluated by therapies and recommended discharge home with OP PT/OT.  ? ? 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  ?CT perfusion with 64 cc delay in left MCA territory and 0cc CBF and collateral flow to left MCA branches. Patient went for intervention with thrombectomy and stent placement of left M1 occlusion.    ?Post-stent CT 10/27 shows continued mild hypodensity of left putamen without other loss of grey-white differentiation or hemorrhage with stent at M1/M2 left MCA ?CTA 10/28 with left MCA stent with nonocclusive thrombus along the posterior wall of the mid stent.  ?Goal bp systolic <607 from neurological standpoint.  ?Unable to obtain MRI/MRA due to ICD  ?2D Echo without thrombus, EF 20-25% and global hypokinesis, severely dilated LV w/grade III diastolic dysfunction. RVP 38 mmHg, decreased IVC respiratory variability, EF similar to prior 09/27/19 ?ICD negative for atrial fibrillation. Continue brillinta 90 mg bid and asa  81 mg qd.    ?LDL 127 ?HgbA1c 5.3 ?VTE prophylaxis - lovenox ?Continue PT/OT  ?Therapy recommendations:  OP PT/OT/SLP ?Disposition: Stable for discharge from neurological standpoint pending PT/OT evaluation today for CIR vs. HHPT. With EF 20-25% without thrombus, ICD negative for atrial fibrillation. Recommend continuing brilinta 90 mg bid and asa 81 mg. Will possibly switch to warfarin in the future per cardiology with concern for thrombus being an ongoing concern. Follow-up in neurology clinic in six weeks.  ? ? ? ? ? ?ROS:   ?14 system review of systems performed and  negative with exception of those listed in HPI ? ?PMH:  ?Past Medical History:  ?Diagnosis Date  ? Automatic implantable cardioverter-defibrillator in situ   ? Chronic CHF (congestive heart failure) (HCC)   ? a. E

## 2021-11-04 NOTE — Patient Instructions (Addendum)
Continue aspirin 81 mg daily  and Crestor for secondary stroke prevention ? ?Continue to follow with Dr. Estanislado Pandy in June/July as advised  ? ?Continue to follow up with PCP regarding blood pressure and cholesterol management  ?Maintain strict control of hypertension with blood pressure goal below 130/90 and cholesterol with LDL cholesterol (bad cholesterol) goal below 70 mg/dL.  ? ?Signs of a Stroke? Follow the BEFAST method:  ?Balance Watch for a sudden loss of balance, trouble with coordination or vertigo ?Eyes Is there a sudden loss of vision in one or both eyes? Or double vision?  ?Face: Ask the person to smile. Does one side of the face droop or is it numb?  ?Arms: Ask the person to raise both arms. Does one arm drift downward? Is there weakness or numbness of a leg? ?Speech: Ask the person to repeat a simple phrase. Does the speech sound slurred/strange? Is the person confused ? ?Time: If you observe any of these signs, call 911. ? ? ? ? ? ? ?Thank you for coming to see [name] at [facility]. I hope we have been able to provide you high quality care today. ? ?You may receive a patient satisfaction survey over the next few weeks. We would appreciate your feedback and comments so that we may continue to improve ourselves and the health of our patients. ? ?

## 2021-11-05 ENCOUNTER — Ambulatory Visit (INDEPENDENT_AMBULATORY_CARE_PROVIDER_SITE_OTHER): Payer: BC Managed Care – PPO

## 2021-11-05 DIAGNOSIS — I480 Paroxysmal atrial fibrillation: Secondary | ICD-10-CM

## 2021-11-05 LAB — CUP PACEART REMOTE DEVICE CHECK
Battery Remaining Longevity: 35 mo
Battery Voltage: 2.96 V
Brady Statistic RV Percent Paced: 0 %
Date Time Interrogation Session: 20230515132506
HighPow Impedance: 58 Ohm
Implantable Lead Implant Date: 20150706
Implantable Lead Location: 753860
Implantable Pulse Generator Implant Date: 20150706
Lead Channel Impedance Value: 399 Ohm
Lead Channel Impedance Value: 456 Ohm
Lead Channel Pacing Threshold Amplitude: 1.125 V
Lead Channel Pacing Threshold Pulse Width: 0.4 ms
Lead Channel Sensing Intrinsic Amplitude: 6 mV
Lead Channel Sensing Intrinsic Amplitude: 6 mV
Lead Channel Setting Pacing Amplitude: 2.25 V
Lead Channel Setting Pacing Pulse Width: 0.4 ms
Lead Channel Setting Sensing Sensitivity: 0.3 mV

## 2021-11-07 ENCOUNTER — Ambulatory Visit (HOSPITAL_COMMUNITY): Payer: Self-pay | Admitting: Pharmacist

## 2021-11-07 LAB — POCT INR: INR: 1.6 — AB (ref 2.0–3.0)

## 2021-11-07 NOTE — Progress Notes (Signed)
LVAD INR 

## 2021-11-13 VITALS — Ht 64.0 in | Wt 230.8 lb

## 2021-11-13 DIAGNOSIS — Z95811 Presence of heart assist device: Secondary | ICD-10-CM

## 2021-11-13 DIAGNOSIS — I5042 Chronic combined systolic (congestive) and diastolic (congestive) heart failure: Secondary | ICD-10-CM

## 2021-11-13 NOTE — Progress Notes (Addendum)
Cardiac Individual Treatment Plan  Patient Details  Name: [name] MRN: [id number] Date of Birth: [birth year] Referring Provider:   Flowsheet Row Cardiac Rehab from 11/13/2021 in [facility] Cardiac and Pulmonary Rehab  Referring Provider Lanette Hampshire MD       Initial Encounter Date:  Flowsheet Row Cardiac Rehab from 11/13/2021 in [facility] Cardiac and Pulmonary Rehab  Date 11/13/21       Visit Diagnosis: Chronic combined systolic and diastolic heart failure ([place])  LVAD (left ventricular assist device) present [facility])  Patient's Home Medications on Admission:  Current Outpatient Medications:    aspirin EC 81 MG tablet, Take 1 tablet (81 mg total) by mouth daily. Swallow whole., Disp: 90 tablet, Rfl: 3   digoxin (LANOXIN) 0.125 MG tablet, Take 1/2 tablet (0.0625 mg total) by mouth daily., Disp: 30 tablet, Rfl: 0   enoxaparin (LOVENOX) 40 MG/0.4ML injection, Inject 0.4 mLs (40 mg total) into the skin every 12 (twelve) hours., Disp: 24 mL, Rfl: 0   methimazole (TAPAZOLE) 10 MG tablet, Take 1 tablet (10 mg total) by mouth 3 (three) times daily., Disp: 270 tablet, Rfl: 1   pantoprazole (PROTONIX) 40 MG tablet, Take 1 tablet (40 mg total) by mouth daily., Disp: 30 tablet, Rfl: 0   potassium chloride SA (KLOR-CON M) 20 MEQ tablet, Take 3 tablets (60 mEq total) by mouth 2 (two) times daily., Disp: 180 tablet, Rfl: 0   predniSONE (DELTASONE) 10 MG tablet, Take 1.5 tablets (15 mg total) by mouth daily with breakfast., Disp: 135 tablet, Rfl: 1   quiniDINE gluconate 324 MG CR tablet, Take 1 tablet (324 mg total) by mouth 2 (two) times daily., Disp: 60 tablet, Rfl: 0   rosuvastatin (CRESTOR) 40 MG tablet, Take 1 tablet (40 mg total) by mouth at bedtime., Disp: 30 tablet, Rfl: 0   sacubitril-valsartan (ENTRESTO) 24-26 MG, Take 1 tablet by mouth 2 (two) times daily., Disp: 60 tablet, Rfl: 6   sildenafil (REVATIO) 20 MG tablet, Take 1 tablet (20 mg total) by mouth 3 (three) times daily., Disp: 90 tablet,  Rfl: 6   spironolactone (ALDACTONE) 25 MG tablet, Take 0.5 tablets (12.5 mg total) by mouth daily., Disp: 45 tablet, Rfl: 3   torsemide (DEMADEX) 20 MG tablet, Take 2 tablets (40 mg total) by mouth daily. (Patient taking differently: Take 40 mg by mouth daily.), Disp: 60 tablet, Rfl: 6   warfarin (COUMADIN) 1 MG tablet, Take 2 mg (2 tab) every day or as directed by HF Clinic, Disp: 120 tablet, Rfl: 11   zinc gluconate 50 MG tablet, Take 1 tablet (50 mg total) by mouth daily., Disp: 30 tablet, Rfl: 6  Past Medical History: Past Medical History:  Diagnosis Date   Automatic implantable cardioverter-defibrillator in situ    Chronic CHF (congestive heart failure) (HCC)    a. EF 15-20% b. RHC (09/2013) RA 14, RV 57/22, PA 64/36 (48), PCWP 18, FIck CO/CI 3.7/1.6, PVR 8.1 WU, PA sat 47%    History of hypothyroidism    History of stomach ulcers    Hyperthyroidism    Hypotension    Morbid obesity ([place])    Myocardial infarction ([place]) 08/2013   Nocturnal dyspnea    Nonischemic cardiomyopathy ([place])    Sinus tachycardia    Sleep apnea    Snoring-prob OSA 09/04/2011   [employer] ICD lead RECALL  6949    Stroke ([place]) 2021   some residual right-sided weakness   UARS (upper airway resistance syndrome) 09/04/2011   HST 12/2013:  AHI 4/hr (numerous episodes of airflow reduction that did not have concomitant desaturation)     Tobacco Use: Social History   Tobacco Use  Smoking Status Never  Smokeless Tobacco Never    Labs: Review Flowsheet        Latest Ref Rng & Units 09/16/2021 09/17/2021 09/18/2021 09/19/2021  Labs for ITP Cardiac and Pulmonary Rehab  O2 Saturation % 72.5   70.5     95.4   63.7   67.5       09/20/2021  Labs for ITP Cardiac and Pulmonary Rehab  O2 Saturation 66.6        Multiple values from one day are sorted in reverse-chronological order         Exercise Target Goals: Exercise Program Goal: Individual exercise prescription set using results from initial 6 min  walk test and THRR while considering  patient's activity barriers and safety.   Exercise Prescription Goal: Initial exercise prescription builds to 30-45 minutes a day of aerobic activity, 2-3 days per week.  Home exercise guidelines will be given to patient during program as part of exercise prescription that the participant will acknowledge.   Education: Aerobic Exercise: - Group verbal and visual presentation on the components of exercise prescription. Introduces F.I.T.T principle from ACSM for exercise prescriptions.  Reviews F.I.T.T. principles of aerobic exercise including progression. Written material given at graduation. Flowsheet Row Cardiac Rehab from 10/17/2020 in [facility] Cardiac and Pulmonary Rehab  Date 08/08/20  Educator [employer]  Instruction Review Code 1- Verbalizes Understanding       Education: Resistance Exercise: - Group verbal and visual presentation on the components of exercise prescription. Introduces F.I.T.T principle from ACSM for exercise prescriptions  Reviews F.I.T.T. principles of resistance exercise including progression. Written material given at graduation. Flowsheet Row Cardiac Rehab from 10/17/2020 in [facility] Cardiac and Pulmonary Rehab  Date 10/17/20  Educator [facility]  Instruction Review Code 1- Verbalizes Understanding        Education: Exercise & Equipment Safety: - Individual verbal instruction and demonstration of equipment use and safety with use of the equipment. Flowsheet Row Cardiac Rehab from 11/13/2021 in [facility] Cardiac and Pulmonary Rehab  Education need identified 11/13/21  Date 11/13/21  Educator [place]  Instruction Review Code 1- Verbalizes Understanding       Education: Exercise Physiology & General Exercise Guidelines: - Group verbal and written instruction with models to review the exercise physiology of the cardiovascular system and associated critical values. Provides general exercise guidelines with specific guidelines to those with heart or lung  disease.  Flowsheet Row Cardiac Rehab from 10/17/2020 in [facility] Cardiac and Pulmonary Rehab  Date 10/03/20  Educator [facility]  Instruction Review Code 1- Verbalizes Understanding       Education: Flexibility, Balance, Mind/Body Relaxation: - Group verbal and visual presentation with interactive activity on the components of exercise prescription. Introduces F.I.T.T principle from ACSM for exercise prescriptions. Reviews F.I.T.T. principles of flexibility and balance exercise training including progression. Also discusses the mind body connection.  Reviews various relaxation techniques to help reduce and manage stress (i.e. Deep breathing, progressive muscle relaxation, and visualization). Balance handout provided to take home. Written material given at graduation. Flowsheet Row Cardiac Rehab from 10/17/2020 in [facility] Cardiac and Pulmonary Rehab  Date 08/22/20  Educator [facility]  Instruction Review Code 1- Verbalizes Understanding       Activity Barriers & Risk Stratification:  Activity Barriers & Cardiac Risk Stratification - 11/13/21 1234       Activity Barriers & Cardiac Risk  Stratification   Activity Barriers Deconditioning;Muscular Weakness;Shortness of Breath;Decreased Ventricular Function;Other (comment)    Comments LVAD    Cardiac Risk Stratification High             6 Minute Walk:  6 Minute Walk     Row Name 11/13/21 1245         6 Minute Walk   Phase Initial     Distance 985 feet     Walk Time 6 minutes     # of Rest Breaks 0     MPH 1.86     METS 2.68     RPE 10     Perceived Dyspnea  1     VO2 Peak 9.41     Symptoms Yes (comment)     Comments Slight SOB, unsteadiness with balance     Resting HR 81 bpm     Resting BP 103/64  automated BP cuff     Resting Oxygen Saturation  97 %     Exercise Oxygen Saturation  during 6 min walk 96 %     Max Ex. HR 105 bpm     Max Ex. BP 112/83  automated BP cuff     2 Minute Post BP 85/74  automated BP cuff; On entresto and denied  dizziness/lightheadedness              Oxygen Initial Assessment:   Oxygen Re-Evaluation:   Oxygen Discharge (Final Oxygen Re-Evaluation):   Initial Exercise Prescription:  Initial Exercise Prescription - 11/13/21 1200       Date of Initial Exercise RX and Referring Provider   Date 11/13/21    Referring Provider Lanette Hampshire MD      Oxygen   Maintain Oxygen Saturation 88% or higher      Recumbant Bike   Level 1    RPM 60    Watts 22    Minutes 15    METs 2.6      NuStep   Level 1    SPM 80    Minutes 15    METs 2.6      REL-XR   Level 1    Speed 50    Minutes 15    METs 2.6      Track   Laps 21    Minutes 15    METs 2.14      Prescription Details   Frequency (times per week) 3    Duration Progress to 30 minutes of continuous aerobic without signs/symptoms of physical distress      Intensity   THRR 40-80% of Max Heartrate 116- 152    Ratings of Perceived Exertion 11-13    Perceived Dyspnea 0-4      Progression   Progression Continue to progress workloads to maintain intensity without signs/symptoms of physical distress.      Resistance Training   Training Prescription Yes    Weight 3 lb    Reps 10-15             Perform Capillary Blood Glucose checks as needed.  Exercise Prescription Changes:   Exercise Prescription Changes     Row Name 11/13/21 1200             Response to Exercise   Blood Pressure (Admit) 103/64  automated BP cuff       Blood Pressure (Exercise) 112/83  automated BP cuff       Blood Pressure (Exit) 85/74  automated BP cuff; On entresto and denied dizziness/lightheadedness  Heart Rate (Admit) 71 bpm       Heart Rate (Exercise) 105 bpm       Heart Rate (Exit) 80 bpm       Oxygen Saturation (Admit) 97 %       Oxygen Saturation (Exercise) 96 %       Oxygen Saturation (Exit) 97 %       Rating of Perceived Exertion (Exercise) 10       Perceived Dyspnea (Exercise) 1       Symptoms Slight SOB, unsteady  gait       Comments walk test results                Exercise Comments:   Exercise Goals and Review:   Exercise Goals     Row Name 11/13/21 1258             Exercise Goals   Increase Physical Activity Yes       Intervention Provide advice, education, support and counseling about physical activity/exercise needs.;Develop an individualized exercise prescription for aerobic and resistive training based on initial evaluation findings, risk stratification, comorbidities and participant's personal goals.       Expected Outcomes Short Term: Attend rehab on a regular basis to increase amount of physical activity.;Long Term: Add in home exercise to make exercise part of routine and to increase amount of physical activity.;Long Term: Exercising regularly at least 3-5 days a week.       Increase Strength and Stamina Yes       Intervention Provide advice, education, support and counseling about physical activity/exercise needs.;Develop an individualized exercise prescription for aerobic and resistive training based on initial evaluation findings, risk stratification, comorbidities and participant's personal goals.       Expected Outcomes Short Term: Increase workloads from initial exercise prescription for resistance, speed, and METs.;Short Term: Perform resistance training exercises routinely during rehab and add in resistance training at home;Long Term: Improve cardiorespiratory fitness, muscular endurance and strength as measured by increased METs and functional capacity (6MWT)       Able to understand and use rate of perceived exertion (RPE) scale Yes       Intervention Provide education and explanation on how to use RPE scale       Expected Outcomes Short Term: Able to use RPE daily in rehab to express subjective intensity level;Long Term:  Able to use RPE to guide intensity level when exercising independently       Able to understand and use Dyspnea scale Yes       Intervention Provide  education and explanation on how to use Dyspnea scale       Expected Outcomes Short Term: Able to use Dyspnea scale daily in rehab to express subjective sense of shortness of breath during exertion;Long Term: Able to use Dyspnea scale to guide intensity level when exercising independently       Knowledge and understanding of Target Heart Rate Range (THRR) Yes       Intervention Provide education and explanation of THRR including how the numbers were predicted and where they are located for reference       Expected Outcomes Short Term: Able to state/look up THRR;Short Term: Able to use daily as guideline for intensity in rehab;Long Term: Able to use THRR to govern intensity when exercising independently       Able to check pulse independently Yes       Intervention Provide education and demonstration on how to check pulse in carotid  and radial arteries.;Review the importance of being able to check your own pulse for safety during independent exercise       Expected Outcomes Short Term: Able to explain why pulse checking is important during independent exercise;Long Term: Able to check pulse independently and accurately       Understanding of Exercise Prescription Yes       Intervention Provide education, explanation, and written materials on patient's individual exercise prescription       Expected Outcomes Short Term: Able to explain program exercise prescription;Long Term: Able to explain home exercise prescription to exercise independently                Exercise Goals Re-Evaluation :   Discharge Exercise Prescription (Final Exercise Prescription Changes):  Exercise Prescription Changes - 11/13/21 1200       Response to Exercise   Blood Pressure (Admit) 103/64   automated BP cuff   Blood Pressure (Exercise) 112/83   automated BP cuff   Blood Pressure (Exit) 85/74   automated BP cuff; On entresto and denied dizziness/lightheadedness   Heart Rate (Admit) 71 bpm    Heart Rate (Exercise)  105 bpm    Heart Rate (Exit) 80 bpm    Oxygen Saturation (Admit) 97 %    Oxygen Saturation (Exercise) 96 %    Oxygen Saturation (Exit) 97 %    Rating of Perceived Exertion (Exercise) 10    Perceived Dyspnea (Exercise) 1    Symptoms Slight SOB, unsteady gait    Comments walk test results             Nutrition:  Target Goals: Understanding of nutrition guidelines, daily intake of sodium '1500mg'$ , cholesterol '200mg'$ , calories 30% from fat and 7% or less from saturated fats, daily to have 5 or more servings of fruits and vegetables.  Education: All About Nutrition: -Group instruction provided by verbal, written material, interactive activities, discussions, models, and posters to present general guidelines for heart healthy nutrition including fat, fiber, MyPlate, the role of sodium in heart healthy nutrition, utilization of the nutrition label, and utilization of this knowledge for meal planning. Follow up email sent as well. Written material given at graduation. Flowsheet Row Cardiac Rehab from 11/13/2021 in [facility] Cardiac and Pulmonary Rehab  Education need identified 11/13/21       Biometrics:  Pre Biometrics - 11/13/21 1234       Pre Biometrics   Height '5\' 4"'$  (1.626 m)    Weight 230 lb 12.8 oz (104.7 kg)   Weighed with LVAD   BMI (Calculated) 39.6    Single Leg Stand 30 seconds              Nutrition Therapy Plan and Nutrition Goals:  Nutrition Therapy & Goals - 11/13/21 1227       Personal Nutrition Goals   Comments Patient has limited diet as she takes coumadin.      Intervention Plan   Intervention Prescribe, educate and counsel regarding individualized specific dietary modifications aiming towards targeted core components such as weight, hypertension, lipid management, diabetes, heart failure and other comorbidities.    Expected Outcomes Short Term Goal: Understand basic principles of dietary content, such as calories, fat, sodium, cholesterol and  nutrients.;Short Term Goal: A plan has been developed with personal nutrition goals set during dietitian appointment.;Long Term Goal: Adherence to prescribed nutrition plan.             Nutrition Assessments:  MEDIFICTS Score Key: ?70 Need to make dietary changes  40-70 Heart Healthy Diet ? 40 Therapeutic Level Cholesterol Diet  Flowsheet Row Cardiac Rehab from 11/13/2021 in [facility] Cardiac and Pulmonary Rehab  Picture Your Plate Total Score on Admission 70      Picture Your Plate Scores: <08 Unhealthy dietary pattern with much room for improvement. 41-50 Dietary pattern unlikely to meet recommendations for good health and room for improvement. 51-60 More healthful dietary pattern, with some room for improvement.  >60 Healthy dietary pattern, although there may be some specific behaviors that could be improved.    Nutrition Goals Re-Evaluation:   Nutrition Goals Discharge (Final Nutrition Goals Re-Evaluation):   Psychosocial: Target Goals: Acknowledge presence or absence of significant depression and/or stress, maximize coping skills, provide positive support system. Participant is able to verbalize types and ability to use techniques and skills needed for reducing stress and depression.   Education: Stress, Anxiety, and Depression - Group verbal and visual presentation to define topics covered.  Reviews how body is impacted by stress, anxiety, and depression.  Also discusses healthy ways to reduce stress and to treat/manage anxiety and depression.  Written material given at graduation. Flowsheet Row Cardiac Rehab from 10/17/2020 in [facility] Cardiac and Pulmonary Rehab  Education need identified 08/06/20  Date 09/26/20  Educator [facility]  Instruction Review Code 1- [employer] Understanding       Education: Sleep Hygiene -Provides group verbal and written instruction about how sleep can affect your health.  Define sleep hygiene, discuss sleep cycles and impact of sleep habits. Review  good sleep hygiene tips.    Initial Review & Psychosocial Screening:  Initial Psych Review & Screening - 11/01/21 1410       Initial Review   Current issues with None Identified      Family Dynamics   Good Support System? Yes   husband and 54 and 23 years children     Barriers   Psychosocial barriers to participate in program There are no identifiable barriers or psychosocial needs.      Screening Interventions   Interventions Encouraged to exercise    Expected Outcomes Short Term goal: Utilizing psychosocial counselor, staff and physician to assist with identification of specific Stressors or current issues interfering with healing process. Setting desired goal for each stressor or current issue identified.;Long Term Goal: Stressors or current issues are controlled or eliminated.;Short Term goal: Identification and review with participant of any Quality of Life or Depression concerns found by scoring the questionnaire.;Long Term goal: The participant improves quality of Life and PHQ9 Scores as seen by post scores and/or verbalization of changes             Quality of Life Scores:   Quality of Life - 11/13/21 1230       Quality of Life   Select Quality of Life      Quality of Life Scores   Health/Function Pre 27.57 %    Socioeconomic Pre 29.64 %    Psych/Spiritual Pre 30 %    Family Pre 30 %    GLOBAL Pre 28.85 %            Scores of 19 and below usually indicate a poorer quality of life in these areas.  A difference of  2-3 points is a clinically meaningful difference.  A difference of 2-3 points in the total score of the Quality of Life Index has been associated with significant improvement in overall quality of life, self-image, physical symptoms, and general health in studies assessing change in quality of life.  PHQ-9: Review Flowsheet        11/13/2021 09/03/2020 08/06/2020 10/05/2017 11/27/2014  Depression screen PHQ 2/9  Decreased Interest 0 0 0 1 0  Down,  Depressed, Hopeless 0 0 0 0 0  PHQ - 2 Score 0 0 0 1 0  Altered sleeping 0 '1 1 1   '$ Tired, decreased energy '1 1 2 2   '$ Change in appetite 1 0 1    Feeling bad or failure about yourself  0 0 0 1   Trouble concentrating 0 0 0 0   Moving slowly or fidgety/restless 0 0 1 0   Suicidal thoughts 0 0 0 0   PHQ-9 Score '2 2 5 5   '$ Difficult doing work/chores Not difficult at all Not difficult at all Not difficult at all Somewhat difficult          Interpretation of Total Score  Total Score Depression Severity:  1-4 = Minimal depression, 5-9 = Mild depression, 10-14 = Moderate depression, 15-19 = Moderately severe depression, 20-27 = Severe depression   Psychosocial Evaluation and Intervention:  Psychosocial Evaluation - 11/01/21 1419       Psychosocial Evaluation & Interventions   Interventions Encouraged to exercise with the program and follow exercise prescription    Comments Britnie is returning to the program after LVAD insertion. She has no barriers to attending. She has become deconditioned after a long period in the hospital and recovering. She has lost weight and wants to lose more weight. She is trying to increase her protein intake and wants to talk with Melissa,our RD.  Noeli is ready to get started.    Expected Outcomes STG Braeden ia able to attend all sechedules sessions. She is able to work with our RD and find ways to increase her protein intake without gaining weight if possible. LTG Adora is able to continue to progress with er exercise and weight loss goals    Continue Psychosocial Services  Follow up required by staff             Psychosocial Re-Evaluation:   Psychosocial Discharge (Final Psychosocial Re-Evaluation):   Vocational Rehabilitation: Provide vocational rehab assistance to qualifying candidates.   Vocational Rehab Evaluation & Intervention:   Education: Education Goals: Education classes will be provided on a variety of topics geared toward better  understanding of heart health and risk factor modification. Participant will state understanding/return demonstration of topics presented as noted by education test scores.  Learning Barriers/Preferences:   General Cardiac Education Topics:  AED/CPR: - Group verbal and written instruction with the use of models to demonstrate the basic use of the AED with the basic ABC's of resuscitation.   Anatomy and Cardiac Procedures: - Group verbal and visual presentation and models provide information about basic cardiac anatomy and function. Reviews the testing methods done to diagnose heart disease and the outcomes of the test results. Describes the treatment choices: Medical Management, Angioplasty, or Coronary Bypass Surgery for treating various heart conditions including Myocardial Infarction, Angina, Valve Disease, and Cardiac Arrhythmias.  Written material given at graduation. Flowsheet Row Cardiac Rehab from 11/13/2021 in [facility] Cardiac and Pulmonary Rehab  Education need identified 11/13/21       Medication Safety: - Group verbal and visual instruction to review commonly prescribed medications for heart and lung disease. Reviews the medication, class of the drug, and side effects. Includes the steps to properly store meds and maintain the prescription regimen.  Written material given at graduation. Flowsheet Row Cardiac Rehab from 10/17/2020  in [facility] Cardiac and Pulmonary Rehab  Date 09/05/20  Educator [facility]  Instruction Review Code 1- Verbalizes Understanding       Intimacy: - Group verbal instruction through game format to discuss how heart and lung disease can affect sexual intimacy. Written material given at graduation.. Flowsheet Row Cardiac Rehab from 10/17/2020 in [facility] Cardiac and Pulmonary Rehab  Date 08/08/20  Educator [facility]  Instruction Review Code 1- Verbalizes Understanding       Know Your Numbers and Heart Failure: - Group verbal and visual instruction to discuss disease risk  factors for cardiac and pulmonary disease and treatment options.  Reviews associated critical values for Overweight/Obesity, Hypertension, Cholesterol, and Diabetes.  Discusses basics of heart failure: signs/symptoms and treatments.  Introduces Heart Failure Zone chart for action plan for heart failure.  Written material given at graduation. Flowsheet Row Cardiac Rehab from 11/13/2021 in [facility] Cardiac and Pulmonary Rehab  Education need identified 11/13/21       Infection Prevention: - Provides verbal and written material to individual with discussion of infection control including proper hand washing and proper equipment cleaning during exercise session. Flowsheet Row Cardiac Rehab from 11/13/2021 in [facility] Cardiac and Pulmonary Rehab  Education need identified 11/13/21  Date 11/13/21  Educator [place]  Instruction Review Code 1- Verbalizes Understanding       Falls Prevention: - Provides verbal and written material to individual with discussion of falls prevention and safety. Flowsheet Row Cardiac Rehab from 11/13/2021 in [facility] Cardiac and Pulmonary Rehab  Education need identified 11/13/21  Date 11/13/21  Educator [place]  Instruction Review Code 1- Verbalizes Understanding       Other: -Provides group and verbal instruction on various topics (see comments)   Knowledge Questionnaire Score:  Knowledge Questionnaire Score - 11/13/21 1229       Knowledge Questionnaire Score   Pre Score 23/26             Core Components/Risk Factors/Patient Goals at Admission:  Personal Goals and Risk Factors at Admission - 11/13/21 1258       Core Components/Risk Factors/Patient Goals on Admission    Weight Management Yes;Weight Loss;Obesity    Intervention Weight Management: Develop a combined nutrition and exercise program designed to reach desired caloric intake, while maintaining appropriate intake of nutrient and fiber, sodium and fats, and appropriate energy expenditure required for the  weight goal.;Weight Management/Obesity: Establish reasonable short term and long term weight goals.    Admit Weight 230 lb 12.8 oz (104.7 kg)   weighed with LVAD & bag   Goal Weight: Short Term 225 lb (102.1 kg)    Goal Weight: Long Term 200 lb (90.7 kg)    Expected Outcomes Short Term: Continue to assess and modify interventions until short term weight is achieved;Long Term: Adherence to nutrition and physical activity/exercise program aimed toward attainment of established weight goal;Weight Loss: Understanding of general recommendations for a balanced deficit meal plan, which promotes 1-2 lb weight loss per week and includes a negative energy balance of [phone] kcal/d;Understanding recommendations for meals to include 15-35% energy as protein, 25-35% energy from fat, 35-60% energy from carbohydrates, less than '200mg'$  of dietary cholesterol, 20-35 gm of total fiber daily;Understanding of distribution of calorie intake throughout the day with the consumption of 4-5 meals/snacks    Heart Failure Yes    Intervention Provide a combined exercise and nutrition program that is supplemented with education, support and counseling about heart failure. Directed toward relieving symptoms such as shortness of breath, decreased exercise  tolerance, and extremity edema.    Expected Outcomes Improve functional capacity of life;Short term: Attendance in program 2-3 days a week with increased exercise capacity. Reported lower sodium intake. Reported increased fruit and vegetable intake. Reports medication compliance.;Short term: Daily weights obtained and reported for increase. Utilizing diuretic protocols set by physician.;Long term: Adoption of self-care skills and reduction of barriers for early signs and symptoms recognition and intervention leading to self-care maintenance.    Hypertension Yes    Intervention Provide education on lifestyle modifcations including regular physical activity/exercise, weight management,  moderate sodium restriction and increased consumption of fresh fruit, vegetables, and low fat dairy, alcohol moderation, and smoking cessation.;Monitor prescription use compliance.    Expected Outcomes Short Term: Continued assessment and intervention until BP is < 140/66m HG in hypertensive participants. < 130/879mHG in hypertensive participants with diabetes, heart failure or chronic kidney disease.;Long Term: Maintenance of blood pressure at goal levels.    Lipids Yes    Intervention Provide education and support for participant on nutrition & aerobic/resistive exercise along with prescribed medications to achieve LDL '70mg'$ , HDL >'40mg'$ .    Expected Outcomes Short Term: Participant states understanding of desired cholesterol values and is compliant with medications prescribed. Participant is following exercise prescription and nutrition guidelines.;Long Term: Cholesterol controlled with medications as prescribed, with individualized exercise RX and with personalized nutrition plan. Value goals: LDL < '70mg'$ , HDL > 40 mg.             Education:Diabetes - Individual verbal and written instruction to review signs/symptoms of diabetes, desired ranges of glucose level fasting, after meals and with exercise. Acknowledge that pre and post exercise glucose checks will be done for 3 sessions at entry of program.   Core Components/Risk Factors/Patient Goals Review:    Core Components/Risk Factors/Patient Goals at Discharge (Final Review):    ITP Comments:  ITP Comments     Row Name 11/01/21 1424 11/13/21 1223         ITP Comments Virtual orientation call completed today. shehas an appointment on Date: 11/13/2021  for EP eval and gym Orientation.  [facility] . Completed 6MWT and gym orientation. Initial ITP created and sent for review to Dr. MaEmily FilbertMedical Director.               Comments: Initial ITP

## 2021-11-13 NOTE — Patient Instructions (Signed)
Patient Instructions  Patient Details  Name: [name] MRN: [id number] Date of Birth: [birth year] Referring Provider:  Larey Dresser, MD  Below are your personal goals for exercise, nutrition, and risk factors. Our goal is to help you stay on track towards obtaining and maintaining these goals. We will be discussing your progress on these goals with you throughout the program.  Initial Exercise Prescription:  Initial Exercise Prescription - 11/13/21 1200       Date of Initial Exercise RX and Referring Provider   Date 11/13/21    Referring Provider Lanette Hampshire MD      Oxygen   Maintain Oxygen Saturation 88% or higher      Recumbant Bike   Level 1    RPM 60    Watts 22    Minutes 15    METs 2.6      NuStep   Level 1    SPM 80    Minutes 15    METs 2.6      REL-XR   Level 1    Speed 50    Minutes 15    METs 2.6      Track   Laps 21    Minutes 15    METs 2.14      Prescription Details   Frequency (times per week) 3    Duration Progress to 30 minutes of continuous aerobic without signs/symptoms of physical distress      Intensity   THRR 40-80% of Max Heartrate 116- 152    Ratings of Perceived Exertion 11-13    Perceived Dyspnea 0-4      Progression   Progression Continue to progress workloads to maintain intensity without signs/symptoms of physical distress.      Resistance Training   Training Prescription Yes    Weight 3 lb    Reps 10-15             Exercise Goals: Frequency: Be able to perform aerobic exercise two to three times per week in program working toward 2-5 days per week of home exercise.  Intensity: Work with a perceived exertion of 11 (fairly light) - 15 (hard) while following your exercise prescription.  We will make changes to your prescription with you as you progress through the program.   Duration: Be able to do 30 to 45 minutes of continuous aerobic exercise in addition to a 5 minute warm-up and a 5 minute cool-down  routine.   Nutrition Goals: Your personal nutrition goals will be established when you do your nutrition analysis with the dietician.  The following are general nutrition guidelines to follow: Cholesterol < '200mg'$ /day Sodium < '1500mg'$ /day Fiber: Women over 50 yrs - 21 grams per day  Personal Goals:  Personal Goals and Risk Factors at Admission - 11/13/21 1258       Core Components/Risk Factors/Patient Goals on Admission    Weight Management Yes;Weight Loss;Obesity    Intervention Weight Management: Develop a combined nutrition and exercise program designed to reach desired caloric intake, while maintaining appropriate intake of nutrient and fiber, sodium and fats, and appropriate energy expenditure required for the weight goal.;Weight Management/Obesity: Establish reasonable short term and long term weight goals.    Admit Weight 230 lb 12.8 oz (104.7 kg)   weighed with LVAD & bag   Goal Weight: Short Term 225 lb (102.1 kg)    Goal Weight: Long Term 200 lb (90.7 kg)    Expected Outcomes Short Term: Continue to assess and modify interventions  until short term weight is achieved;Long Term: Adherence to nutrition and physical activity/exercise program aimed toward attainment of established weight goal;Weight Loss: Understanding of general recommendations for a balanced deficit meal plan, which promotes 1-2 lb weight loss per week and includes a negative energy balance of [phone] kcal/d;Understanding recommendations for meals to include 15-35% energy as protein, 25-35% energy from fat, 35-60% energy from carbohydrates, less than '200mg'$  of dietary cholesterol, 20-35 gm of total fiber daily;Understanding of distribution of calorie intake throughout the day with the consumption of 4-5 meals/snacks    Heart Failure Yes    Intervention Provide a combined exercise and nutrition program that is supplemented with education, support and counseling about heart failure. Directed toward relieving symptoms such as  shortness of breath, decreased exercise tolerance, and extremity edema.    Expected Outcomes Improve functional capacity of life;Short term: Attendance in program 2-3 days a week with increased exercise capacity. Reported lower sodium intake. Reported increased fruit and vegetable intake. Reports medication compliance.;Short term: Daily weights obtained and reported for increase. Utilizing diuretic protocols set by physician.;Long term: Adoption of self-care skills and reduction of barriers for early signs and symptoms recognition and intervention leading to self-care maintenance.    Hypertension Yes    Intervention Provide education on lifestyle modifcations including regular physical activity/exercise, weight management, moderate sodium restriction and increased consumption of fresh fruit, vegetables, and low fat dairy, alcohol moderation, and smoking cessation.;Monitor prescription use compliance.    Expected Outcomes Short Term: Continued assessment and intervention until BP is < 140/22m HG in hypertensive participants. < 130/841mHG in hypertensive participants with diabetes, heart failure or chronic kidney disease.;Long Term: Maintenance of blood pressure at goal levels.    Lipids Yes    Intervention Provide education and support for participant on nutrition & aerobic/resistive exercise along with prescribed medications to achieve LDL '70mg'$ , HDL >'40mg'$ .    Expected Outcomes Short Term: Participant states understanding of desired cholesterol values and is compliant with medications prescribed. Participant is following exercise prescription and nutrition guidelines.;Long Term: Cholesterol controlled with medications as prescribed, with individualized exercise RX and with personalized nutrition plan. Value goals: LDL < '70mg'$ , HDL > 40 mg.             Tobacco Use Initial Evaluation: Social History   Tobacco Use  Smoking Status Never  Smokeless Tobacco Never    Exercise Goals and Review:   Exercise Goals     Row Name 11/13/21 1258             Exercise Goals   Increase Physical Activity Yes       Intervention Provide advice, education, support and counseling about physical activity/exercise needs.;Develop an individualized exercise prescription for aerobic and resistive training based on initial evaluation findings, risk stratification, comorbidities and participant's personal goals.       Expected Outcomes Short Term: Attend rehab on a regular basis to increase amount of physical activity.;Long Term: Add in home exercise to make exercise part of routine and to increase amount of physical activity.;Long Term: Exercising regularly at least 3-5 days a week.       Increase Strength and Stamina Yes       Intervention Provide advice, education, support and counseling about physical activity/exercise needs.;Develop an individualized exercise prescription for aerobic and resistive training based on initial evaluation findings, risk stratification, comorbidities and participant's personal goals.       Expected Outcomes Short Term: Increase workloads from initial exercise prescription for resistance, speed, and METs.;Short Term:  Perform resistance training exercises routinely during rehab and add in resistance training at home;Long Term: Improve cardiorespiratory fitness, muscular endurance and strength as measured by increased METs and functional capacity (6MWT)       Able to understand and use rate of perceived exertion (RPE) scale Yes       Intervention Provide education and explanation on how to use RPE scale       Expected Outcomes Short Term: Able to use RPE daily in rehab to express subjective intensity level;Long Term:  Able to use RPE to guide intensity level when exercising independently       Able to understand and use Dyspnea scale Yes       Intervention Provide education and explanation on how to use Dyspnea scale       Expected Outcomes Short Term: Able to use Dyspnea scale  daily in rehab to express subjective sense of shortness of breath during exertion;Long Term: Able to use Dyspnea scale to guide intensity level when exercising independently       Knowledge and understanding of Target Heart Rate Range (THRR) Yes       Intervention Provide education and explanation of THRR including how the numbers were predicted and where they are located for reference       Expected Outcomes Short Term: Able to state/look up THRR;Short Term: Able to use daily as guideline for intensity in rehab;Long Term: Able to use THRR to govern intensity when exercising independently       Able to check pulse independently Yes       Intervention Provide education and demonstration on how to check pulse in carotid and radial arteries.;Review the importance of being able to check your own pulse for safety during independent exercise       Expected Outcomes Short Term: Able to explain why pulse checking is important during independent exercise;Long Term: Able to check pulse independently and accurately       Understanding of Exercise Prescription Yes       Intervention Provide education, explanation, and written materials on patient's individual exercise prescription       Expected Outcomes Short Term: Able to explain program exercise prescription;Long Term: Able to explain home exercise prescription to exercise independently                Copy of goals given to participant.

## 2021-11-14 ENCOUNTER — Ambulatory Visit (HOSPITAL_COMMUNITY): Payer: Self-pay | Admitting: Pharmacist

## 2021-11-14 LAB — POCT INR: INR: 2 (ref 2.0–3.0)

## 2021-11-14 NOTE — Progress Notes (Signed)
LVAD INR 

## 2021-11-20 DIAGNOSIS — Z95811 Presence of heart assist device: Secondary | ICD-10-CM | POA: Diagnosis not present

## 2021-11-20 DIAGNOSIS — I5042 Chronic combined systolic (congestive) and diastolic (congestive) heart failure: Secondary | ICD-10-CM | POA: Diagnosis not present

## 2021-11-20 NOTE — Progress Notes (Signed)
Daily Session Note  Patient Details  Name: REGAN MCBRYAR MRN: [id number] Date of Birth: [birth year] Referring Provider:   Flowsheet Row Cardiac Rehab from 11/13/2021 in [facility] Cardiac and Pulmonary Rehab  Referring Provider Lanette Hampshire MD       Encounter Date: 11/20/2021  Check In:  Session Check In - 11/20/21 1001       Check-In   Supervising physician immediately available to respond to emergencies See telemetry face sheet for immediately available ER MD    Location [facility]-Cardiac & Pulmonary Rehab    Staff Present Birdie Sons, MPA, Nino Glow, MS, ASCM CEP, Exercise Physiologist;Noah Tickle, BS, Exercise Physiologist;Joseph Tessie Fass, [place]    Virtual Visit No    Medication changes reported     No    Fall or balance concerns reported    No    Warm-up and Cool-down Performed on first and last piece of equipment    Resistance Training Performed Yes    VAD Patient? No    PAD/SET Patient? No      Pain Assessment   Currently in Pain? No/denies                Social History   Tobacco Use  Smoking Status Never  Smokeless Tobacco Never    Goals Met:  Independence with exercise equipment Exercise tolerated well No report of concerns or symptoms today Strength training completed today  Goals Unmet:  Not Applicable  Comments: First full day of exercise!  Patient was oriented to gym and equipment including functions, settings, policies, and procedures.  Patient's individual exercise prescription and treatment plan were reviewed.  All starting workloads were established based on the results of the 6 minute walk test done at initial orientation visit.  The plan for exercise progression was also introduced and progression will be customized based on patient's performance and goals.    Dr. Emily Filbert is Medical Director for [facility].  Dr. Ottie Glazier is Medical Director for [facility] Pulmonary Rehabilitation.

## 2021-11-20 NOTE — Progress Notes (Signed)
Remote ICD transmission.   

## 2021-11-21 ENCOUNTER — Ambulatory Visit (HOSPITAL_COMMUNITY): Payer: Self-pay | Admitting: Pharmacist

## 2021-11-21 LAB — POCT INR: INR: 2.6 (ref 2.0–3.0)

## 2021-11-21 NOTE — Progress Notes (Signed)
LVAD INR 

## 2021-11-22 ENCOUNTER — Telehealth (HOSPITAL_COMMUNITY): Payer: Self-pay | Admitting: *Deleted

## 2021-11-22 NOTE — Telephone Encounter (Signed)
Husband called VAD pager to report his wife had a: "broken heart" alarm this morning. Reviewed alarm history and found she had a LOW FLOW alarm at  this am. Patient had been having more dizzy spells lately, but denies any syncope. Current VAD parameters are 5800, flow 4.6, PI 3.5, power 4.6. She confirms she is drinking 2 liters/day as instructed.   Reviewed above with Dr. [name] along with current meds. Patient reports taking torsemide 40 mg daily and "not voiding much". Instructed patient to hold Entresto. Also asked her to hold Torsemide for three days, if swelling or weight gain occurs, may restart at 20 mg daily.   Patient has f/u appt Wednesday 11/27/21; offered to see her Monday or Tuesday, she has other appointments. Asked her to call VAD pager is symptoms worsen or do not improve over weekend. Husband and patient verbalized understanding of above.  Zada Girt RN, VAD Coordinator [phone] 24/7 VAD Pager: [phone]

## 2021-11-25 ENCOUNTER — Ambulatory Visit
Admission: RE | Admit: 2021-11-25 | Discharge: 2021-11-25 | Disposition: A | Discharge status: Home / Self Care | Payer: BC Managed Care – PPO | Source: Ambulatory Visit | Attending: Gynecologic Oncology | Admitting: Gynecologic Oncology

## 2021-11-25 ENCOUNTER — Other Ambulatory Visit (HOSPITAL_COMMUNITY): Payer: Self-pay | Admitting: *Deleted

## 2021-11-25 DIAGNOSIS — N9489 Other specified conditions associated with female genital organs and menstrual cycle: Secondary | ICD-10-CM | POA: Insufficient documentation

## 2021-11-25 DIAGNOSIS — I5022 Chronic systolic (congestive) heart failure: Secondary | ICD-10-CM

## 2021-11-25 DIAGNOSIS — R1909 Other intra-abdominal and pelvic swelling, mass and lump: Secondary | ICD-10-CM | POA: Diagnosis not present

## 2021-11-25 DIAGNOSIS — Z95811 Presence of heart assist device: Secondary | ICD-10-CM

## 2021-11-25 DIAGNOSIS — N838 Other noninflammatory disorders of ovary, fallopian tube and broad ligament: Secondary | ICD-10-CM | POA: Diagnosis not present

## 2021-11-25 DIAGNOSIS — Z7901 Long term (current) use of anticoagulants: Secondary | ICD-10-CM

## 2021-11-26 ENCOUNTER — Encounter: Payer: Self-pay | Admitting: Internal Medicine

## 2021-11-26 ENCOUNTER — Ambulatory Visit (INDEPENDENT_AMBULATORY_CARE_PROVIDER_SITE_OTHER): Payer: BC Managed Care – PPO | Admitting: Internal Medicine

## 2021-11-26 ENCOUNTER — Telehealth: Payer: Self-pay | Admitting: *Deleted

## 2021-11-26 ENCOUNTER — Other Ambulatory Visit (HOSPITAL_COMMUNITY): Payer: BC Managed Care – PPO

## 2021-11-26 VITALS — HR 88 | Ht 63.0 in | Wt 228.0 lb

## 2021-11-26 DIAGNOSIS — I6602 Occlusion and stenosis of left middle cerebral artery: Secondary | ICD-10-CM

## 2021-11-26 DIAGNOSIS — I428 Other cardiomyopathies: Secondary | ICD-10-CM

## 2021-11-26 DIAGNOSIS — I5022 Chronic systolic (congestive) heart failure: Secondary | ICD-10-CM | POA: Diagnosis not present

## 2021-11-26 DIAGNOSIS — Z95810 Presence of automatic (implantable) cardiac defibrillator: Secondary | ICD-10-CM | POA: Diagnosis not present

## 2021-11-26 DIAGNOSIS — I472 Ventricular tachycardia, unspecified: Secondary | ICD-10-CM | POA: Diagnosis not present

## 2021-11-26 DIAGNOSIS — R5381 Other malaise: Secondary | ICD-10-CM | POA: Diagnosis not present

## 2021-11-26 LAB — CUP PACEART INCLINIC DEVICE CHECK
Battery Remaining Longevity: 32 mo
Battery Voltage: 2.95 V
Brady Statistic RV Percent Paced: 0 %
Date Time Interrogation Session: 20230606164312
HighPow Impedance: 57 Ohm
Implantable Lead Implant Date: 20150706
Implantable Lead Location: 753860
Implantable Pulse Generator Implant Date: 20150706
Lead Channel Impedance Value: 380 Ohm
Lead Channel Impedance Value: 456 Ohm
Lead Channel Pacing Threshold Amplitude: 1 V
Lead Channel Pacing Threshold Pulse Width: 0.4 ms
Lead Channel Sensing Intrinsic Amplitude: 4.875 mV
Lead Channel Sensing Intrinsic Amplitude: 5.375 mV
Lead Channel Setting Pacing Amplitude: 2.25 V
Lead Channel Setting Pacing Pulse Width: 0.4 ms
Lead Channel Setting Sensing Sensitivity: 0.3 mV

## 2021-11-26 MED ORDER — DIGOXIN 125 MCG PO TABS: ORAL_TABLET | ORAL | 6 refills | Status: DC

## 2021-11-26 NOTE — Progress Notes (Signed)
Patient ID: [name], female   DOB: 04/28/1971, 51 y.o.   MRN: [id number]       Patient Care Team: Patient, No Pcp Per (Inactive) as PCP - General (General Practice) Larey Dresser, MD as PCP - Cardiology (Cardiology) Deboraha Sprang, MD as PCP - Electrophysiology (Cardiology) Sueanne Margarita, MD as PCP - Sleep Medicine (Cardiology)   HPI  [name] is a 51 y.o. female seen in follow-up for  ICD implanted for nonischemic cardiomyopathy.  She has a history of ventricular tachycardia with polymorphic and monomorphic treated by her device and is on amiodarone.  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  Question cardioembolic.  Underwent stenting.  On DAPT.  To see neuroradiology soon with the question of discontinuing Brilinta  Interval insertion of LVAD  3/23  She is feeling much better.  More active.  Less fatigue.  No edema.   Tolerating quinidine--without neuro or GI  DATE TEST EF%    4/15 LHC (CE)  Cors w/out obstruction  8/15 Echo   20 %    12/17 Cardio Cath (CE)    10/19 Echo  20-25% MR mod  10/21 Echo  20-25%    1/22 Echo 20-25%   5/22 Echo 25%   3/23 Echo  <20%      Date PVCs     /2016 1%    2/20 11%--VTNS Monomorphic pop  VTNS -MM and PM   3/20 13.3% Sharma Covert morphic population 7.& 4 % Total  1/21 6.2%     Date Cr K TSH LFTs Dig Hgb WBC Platelets  4/20 1.37 4.0 12.027 26        11/21  1.28 3.2 (adjusted) 3.1 14  9.6    5/22 1.42 3.9 4.06 (2/22) 14 0.7(1/22) 10.8(4/22) 4.9K (4/22)   5/23 0.85 3.3 0.013  0.3 (4/23) 11.8 7.2 (4/23) 250 (4/23)   Antiarrhythmics Date Reason stopped  Amio  Hyperthyroidism  [place]   ineffective  Quinidine           Records and Results Reviewed   Past Medical History:  Diagnosis Date   Automatic implantable cardioverter-defibrillator in situ    Chronic CHF (congestive heart failure) ([place])    a. EF 15-20% b. RHC (09/2013) RA 14, RV 57/22, PA 64/36 (48), PCWP 18, FIck CO/CI 3.7/1.6, PVR 8.1 WU, PA sat 47%    History of hypothyroidism    History of stomach ulcers    Hyperthyroidism    Hypotension    Morbid obesity ([place])    Myocardial infarction ([place]) 08/2013   Nocturnal dyspnea    Nonischemic cardiomyopathy ([place])    Sinus tachycardia    Sleep apnea    Snoring-prob OSA 09/04/2011   [employer] Fidel[employer] ICD lead RECALL  6949    Stroke ([place]) 2021   some residual right-sided weakness   UARS (upper airway resistance syndrome) 09/04/2011   HST 12/2013:  AHI 4/hr (numerous episodes of airflow reduction that did not have concomitant desaturation)     Past Surgical History:  Procedure Laterality Date   BREATH TEK H PYLORI N/A 11/09/2014   Procedure: BREATH TEK Kandis Ban;  Surgeon: Greer Pickerel, MD;  Location: [facility];  Service: General;  Laterality: N/A;   CARDIAC CATHETERIZATION  ~ 2006; 09/2013   CARDIAC CATHETERIZATION N/A 05/23/2016   Procedure: Right Heart Cath;  Surgeon: Larey Dresser, MD;  Location: [place] [facility];  Service: Cardiovascular;  Laterality: N/A;   CARDIAC DEFIBRILLATOR PLACEMENT  2006; 12/26/2013   Medtronic [id number]; 12-2013 ICD gen change and RV lead revision with new 6935 RV lead by Dr [name]   CENTRAL LINE INSERTION  08/28/2021   Procedure: CENTRAL LINE INSERTION;  Surgeon: Larey Dresser, MD;  Location:  CV LAB;  Service: Cardiovascular;;   CESAREAN SECTION  1999   IMPLANTABLE CARDIOVERTER DEFIBRILLATOR GENERATOR CHANGE N/A 12/26/2013   Procedure: IMPLANTABLE CARDIOVERTER DEFIBRILLATOR GENERATOR CHANGE;  Surgeon: Deboraha Sprang, MD;  Location: [facility] CATH LAB;  Service: Cardiovascular;  Laterality: N/A;   INSERTION OF IMPLANTABLE  LEFT VENTRICULAR ASSIST DEVICE N/A 09/03/2021   Procedure: INSERTION OF IMPLANTABLE LEFT VENTRICULAR ASSIST DEVICE/ Heartmate 3;  Surgeon: Dahlia Byes, MD;  Location: [place];  Service: Open Heart Surgery;  Laterality: N/A;   IR CT HEAD LTD  04/17/2020   IR CT HEAD LTD  04/17/2020   IR INTRA CRAN STENT  04/17/2020   IR PERCUTANEOUS ART THROMBECTOMY/INFUSION INTRACRANIAL INC DIAG ANGIO  04/17/2020   IR RADIOLOGIST EVAL & MGMT  06/05/2020   LEAD REVISION N/A 12/26/2013   Procedure: LEAD REVISION;  Surgeon: Deboraha Sprang, MD;  Location: [facility] CATH LAB;  Service: Cardiovascular;  Laterality: N/A;   PLACEMENT OF IMPELLA LEFT VENTRICULAR ASSIST DEVICE Right 08/29/2021   Procedure: PLACEMENT OF IMPELLA 5.5 LEFT VENTRICULAR ASSIST DEVICE;  Surgeon: Gaye Pollack, MD;  Location: [place];  Service: Open Heart Surgery;  Laterality: Right;   RADIOLOGY WITH ANESTHESIA N/A 04/17/2020   Procedure: Code Stroke;  Surgeon: Luanne Bras, MD;  Location: [place];  Service: Radiology;  Laterality: N/A;   REMOVAL OF IMPELLA LEFT VENTRICULAR ASSIST DEVICE N/A 09/03/2021   Procedure: REMOVAL OF IMPELLA LEFT VENTRICULAR ASSIST DEVICE;  Surgeon: Dahlia Byes, MD;  Location: [place];  Service: Open Heart Surgery;  Laterality: N/A;   RIGHT HEART CATH N/A 08/28/2021   Procedure: RIGHT HEART CATH;  Surgeon: Larey Dresser, MD;  Location: [place] CV LAB;  Service: Cardiovascular;  Laterality: N/A;   RIGHT HEART CATHETERIZATION N/A 02/15/2014   Procedure: RIGHT HEART CATH;  Surgeon: Larey Dresser, MD;  Location: [facility] CATH LAB;  Service: Cardiovascular;  Laterality: N/A;   TEE WITHOUT CARDIOVERSION N/A 08/29/2021   Procedure: TRANSESOPHAGEAL ECHOCARDIOGRAM (TEE);  Surgeon: Gaye Pollack, MD;  Location: [place];  Service: Open Heart Surgery;  Laterality: N/A;   TEE WITHOUT CARDIOVERSION N/A 09/03/2021   Procedure: TRANSESOPHAGEAL ECHOCARDIOGRAM (TEE);  Surgeon: Dahlia Byes, MD;  Location: [place];  Service: Open Heart Surgery;   Laterality: N/A;   TUBAL LIGATION  1999    Current Meds  Medication Sig   aspirin EC 81 MG tablet Take 1 tablet (81 mg total) by mouth daily. Swallow whole.   digoxin (LANOXIN) 0.125 MG tablet Take 1/2 tablet (0.0625 mg total) by mouth daily.   enoxaparin (LOVENOX) 40 MG/0.4ML injection Inject 0.4 mLs (40 mg total) into the skin every 12 (twelve) hours.   methimazole (TAPAZOLE) 10 MG tablet Take 1 tablet (10 mg total) by mouth 3 (three) times daily.   pantoprazole (PROTONIX) 40 MG tablet Take 1 tablet (40 mg total) by mouth daily.   potassium chloride SA (KLOR-CON M) 20 MEQ tablet Take 3  tablets (60 mEq total) by mouth 2 (two) times daily.   predniSONE (DELTASONE) 10 MG tablet Take 1.5 tablets (15 mg total) by mouth daily with breakfast.   quiniDINE gluconate 324 MG CR tablet Take 1 tablet (324 mg total) by mouth 2 (two) times daily.   rosuvastatin (CRESTOR) 40 MG tablet Take 1 tablet (40 mg total) by mouth at bedtime.   sacubitril-valsartan (ENTRESTO) 24-26 MG Take 1 tablet by mouth 2 (two) times daily.   sildenafil (REVATIO) 20 MG tablet Take 1 tablet (20 mg total) by mouth 3 (three) times daily.   spironolactone (ALDACTONE) 25 MG tablet Take 0.5 tablets (12.5 mg total) by mouth daily.   torsemide (DEMADEX) 20 MG tablet Take 40 mg by mouth daily.   warfarin (COUMADIN) 1 MG tablet Take 2 mg (2 tab) every day or as directed by HF Clinic   zinc gluconate 50 MG tablet Take 1 tablet (50 mg total) by mouth daily.    No Known Allergies  Review of Systems negative except from HPI and PMH  Physical Exam: Pulse 88   Ht '5\' 3"'$  (1.6 m)   Wt 228 lb (103.4 kg)   SpO2 97%   BMI 40.39 kg/m  Well developed and well nourished in no acute distress HENT normal Neck supple with JVP-flat Clear Device pocket well healed; without hematoma or erythema.  There is no tethering  Regular rate and rhythm, no  gallop  murmur  but LVAD hum Abd-soft with active BS No Clubbing cyanosis  edema Skin-warm and  dry A & Oriented  Grossly normal sensory and motor function  ECG artifact but regular rhythm     Assessment and  Plan:  Nonischemic cardiomyopathy  Ventricular tachycardia-monomorphic and polymorphic-recurrent  LVAD in place  PVCs variably frequent    Systolic heart failure-chronic    Implantable defibrillator-Medtronic     High Risk Medication Surveillance --amio/quinidine   MR/TR  Stroke with intracranial stenting on DAPT  Hypokalemia   No VT  continue quinidine 324 and amio has been stopped secondary to hyperthyroidism.  We reviewed the potential benefits of an LVAD in terms of reducing the VT burden secondary to electromechanical interactions; moreover, the need for therapy is reduced because of hemodynamic support of the LVAD.  With the discontinuation of amiodarone, her dig level will decrease, have discussed this with Dr. DM we will increase it back to 0.125 mg daily.  We will check a trough level in about 1 week.  Given the presence of the LVAD and the history of shock therapy, we will eliminate the shocks in the VT zone-make sure to mostly be awake with recurrent ventricular tachycardia given the presence of the LVAD.  At that point she can be sedated and shocked semi-emergently.  Pt heart failure status is stable. Continue torsemide 40 mg.   Last K was low, ascribed to not taking her K supplement that day, repeat draw scheduled for tomorrow

## 2021-11-26 NOTE — Patient Instructions (Signed)
Medication Instructions:  - Your physician has recommended you make the following change in your medication:   1) INCREASE Digoxin 0.125 mg: - take 1 whole tablet by mouth once daily   *If you need a refill on your cardiac medications before your next appointment, please call your pharmacy*   Lab Work: - none ordered  If you have labs (blood work) drawn today and your tests are completely normal, you will receive your results only by: [place] (if you have MyChart) OR A paper copy in the mail If you have any lab test that is abnormal or we need to change your treatment, we will call you to review the results.   Testing/Procedures: - none ordered   Follow-Up: At [facility], you and your health needs are our priority.  As part of our continuing mission to provide you with exceptional heart care, we have created designated Provider Care Teams.  These Care Teams include your primary Cardiologist (physician) and Advanced Practice Providers (APPs -  Physician Assistants and Nurse Practitioners) who all work together to provide you with the care you need, when you need it.  We recommend signing up for the patient portal called "MyChart".  Sign up information is provided on this After Visit Summary.  MyChart is used to connect with patients for Virtual Visits (Telemedicine).  Patients are able to view lab/test results, encounter notes, upcoming appointments, etc.  Non-urgent messages can be sent to your provider as well.   To learn more about what you can do with MyChart, go to [email or web address].    Your next appointment:   1 year(s)  The format for your next appointment:   In Person  Provider:   Virl Axe, MD    Other Instructions N/a  Important Information About [place]

## 2021-11-26 NOTE — Telephone Encounter (Signed)
Received a call from radiology to inform us that the pelvis ultrasound results are completed. Reviewed the 11.9 cm diameter complex cystic mass left adnexa, only minimally increased in volume since previous study, containing multiple septations and some peripheral vascularity as well as a potential mural nodule up to 13 x 24 mm. Miranda John, NP notified.

## 2021-11-27 ENCOUNTER — Ambulatory Visit (HOSPITAL_COMMUNITY): Payer: Self-pay | Admitting: Pharmacist

## 2021-11-27 ENCOUNTER — Inpatient Hospital Stay: Payer: BC Managed Care – PPO | Attending: Gynecologic Oncology | Admitting: Gynecologic Oncology

## 2021-11-27 ENCOUNTER — Ambulatory Visit (HOSPITAL_COMMUNITY)
Admission: RE | Admit: 2021-11-27 | Discharge: 2021-11-27 | Disposition: A | Discharge status: Home / Self Care | Payer: BC Managed Care – PPO | Source: Ambulatory Visit | Attending: Cardiology | Admitting: Cardiology

## 2021-11-27 ENCOUNTER — Encounter (HOSPITAL_COMMUNITY): Payer: Self-pay

## 2021-11-27 VITALS — BP 101/60 | HR 85 | Wt 229.0 lb

## 2021-11-27 DIAGNOSIS — E46 Unspecified protein-calorie malnutrition: Secondary | ICD-10-CM | POA: Insufficient documentation

## 2021-11-27 DIAGNOSIS — G4733 Obstructive sleep apnea (adult) (pediatric): Secondary | ICD-10-CM | POA: Insufficient documentation

## 2021-11-27 DIAGNOSIS — Z7901 Long term (current) use of anticoagulants: Secondary | ICD-10-CM

## 2021-11-27 DIAGNOSIS — I110 Hypertensive heart disease with heart failure: Secondary | ICD-10-CM | POA: Insufficient documentation

## 2021-11-27 DIAGNOSIS — I5022 Chronic systolic (congestive) heart failure: Secondary | ICD-10-CM | POA: Diagnosis not present

## 2021-11-27 DIAGNOSIS — I4891 Unspecified atrial fibrillation: Secondary | ICD-10-CM | POA: Insufficient documentation

## 2021-11-27 DIAGNOSIS — I509 Heart failure, unspecified: Secondary | ICD-10-CM

## 2021-11-27 DIAGNOSIS — I428 Other cardiomyopathies: Secondary | ICD-10-CM | POA: Diagnosis not present

## 2021-11-27 DIAGNOSIS — D649 Anemia, unspecified: Secondary | ICD-10-CM

## 2021-11-27 DIAGNOSIS — I639 Cerebral infarction, unspecified: Secondary | ICD-10-CM | POA: Insufficient documentation

## 2021-11-27 DIAGNOSIS — Z6841 Body Mass Index (BMI) 40.0 and over, adult: Secondary | ICD-10-CM | POA: Insufficient documentation

## 2021-11-27 DIAGNOSIS — N9489 Other specified conditions associated with female genital organs and menstrual cycle: Secondary | ICD-10-CM | POA: Diagnosis not present

## 2021-11-27 DIAGNOSIS — Z95811 Presence of heart assist device: Secondary | ICD-10-CM

## 2021-11-27 DIAGNOSIS — I340 Nonrheumatic mitral (valve) insufficiency: Secondary | ICD-10-CM | POA: Insufficient documentation

## 2021-11-27 DIAGNOSIS — N183 Chronic kidney disease, stage 3 unspecified: Secondary | ICD-10-CM | POA: Diagnosis not present

## 2021-11-27 DIAGNOSIS — I480 Paroxysmal atrial fibrillation: Secondary | ICD-10-CM

## 2021-11-27 DIAGNOSIS — E0590 Thyrotoxicosis, unspecified without thyrotoxic crisis or storm: Secondary | ICD-10-CM | POA: Diagnosis not present

## 2021-11-27 DIAGNOSIS — D72829 Elevated white blood cell count, unspecified: Secondary | ICD-10-CM | POA: Diagnosis not present

## 2021-11-27 DIAGNOSIS — I493 Ventricular premature depolarization: Secondary | ICD-10-CM | POA: Diagnosis not present

## 2021-11-27 LAB — CBC
HCT: 26.4 % — ABNORMAL LOW (ref 36.0–46.0)
Hemoglobin: 8.1 g/dL — ABNORMAL LOW (ref 12.0–15.0)
MCH: 26.4 pg (ref 26.0–34.0)
MCHC: 30.7 g/dL (ref 30.0–36.0)
MCV: 86 fL (ref 80.0–100.0)
Platelets: 326 10*3/uL (ref 150–400)
RBC: 3.07 MIL/uL — ABNORMAL LOW (ref 3.87–5.11)
RDW: 17.6 % — ABNORMAL HIGH (ref 11.5–15.5)
WBC: 12.8 10*3/uL — ABNORMAL HIGH (ref 4.0–10.5)
nRBC: 0.2 % (ref 0.0–0.2)

## 2021-11-27 LAB — BASIC METABOLIC PANEL
Anion gap: 6 (ref 5–15)
BUN: 11 mg/dL (ref 6–20)
CO2: 25 mmol/L (ref 22–32)
Calcium: 9.1 mg/dL (ref 8.9–10.3)
Chloride: 109 mmol/L (ref 98–111)
Creatinine, Ser: 1.01 mg/dL — ABNORMAL HIGH (ref 0.44–1.00)
GFR, Estimated: >60 mL/min (ref >60)
Glucose, Bld: 78 mg/dL (ref 70–99)
Potassium: 3.8 mmol/L (ref 3.5–5.1)
Sodium: 140 mmol/L (ref 135–145)

## 2021-11-27 LAB — PROTIME-INR
INR: 2.1 — ABNORMAL HIGH (ref 0.8–1.2)
Prothrombin Time: 23.3 seconds — ABNORMAL HIGH (ref 11.4–15.2)

## 2021-11-27 LAB — LACTATE DEHYDROGENASE: LDH: 178 U/L (ref 98–192)

## 2021-11-27 NOTE — Progress Notes (Signed)
Signed      Show:Clear all '[x]' Written'[x]' Templated'[x]' Copied  Added by: '[x]' [name], RN  '[]' Hover for details                                                                                           Patient presents for 1 month f/u up with 3 month Intermacs in [facility] today with her husband [name]. Reports no problems with VAD equipment or concerns with drive line.   Pt states that she is feeling pretty good since her last visit. She is started to get out and walk some outside. Denies shortness of breath, heart failure symptoms, and signs of GI bleeding. Reports mild lightheadedness and dizziness that she describes as "almost constant." Reports she has had a very heavy menstrual cycle over the last 2 weeks that ended 2 days ago. Hgb is 8.1 today (down from 11.8 one month ago). Plan to recheck CBC, anemia panel, and digoxin level next week.    Pt confirmed that she stopped Amiodarone as instructed at last visit. Confirms she has held Torsemide and Entresto since Friday 6/2 after calling with a LOW FLOW. No further LOW FLOWs noted. BP stable today. Wt up 4 lbs. Per [name] NP pt may take 1/2 tablet of Torsemide if wt hits 229 - 230 lbs. Pt to call if short of breath, palpations, or VAD alarms occur. She verbalized understanding.    Pt had appt with Dr [name] yesterday: "Given the presence of the LVAD and the history of shock therapy, we will eliminate the shocks in the VT zone-make sure to mostly be awake with recurrent ventricular tachycardia given the presence of the LVAD.  At that point she can be sedated and shocked semi-emergently." Digoxin increased to 0.125 mg daily per Dr [name]. Dr [name] aware and in agreement.    Pt has seen Endo and OB/GYN. Endo recently increased Methimazole based off lab results. She has Endo follow up scheduled 12/26/21. 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 Dr [name] informed the pt that we would need to wait 6 months prior  to her having surgery this date would be 03/06/22. She has had a very heavy menstrual cycle this month with almost 4 gm drop in Hgb. She has an appt with her OB/GYN today and will discuss this with her. They will also review recent pelvic ultrasound results:    Unremarkable uterus, endometrial complex and RIGHT ovary.   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   In the absence of being able to characterize this lesion by MR due to presence of a pacemaker, recommend surgical evaluation to exclude cystic ovarian neoplasm.     Scheduled for home sleep study 12/10/21 per Dr [name].    Pt participating in cardiac rehab. She is enjoying this so far.    Drive line dressing change completed twice weekly at home per [facility]. Dressing change today in clinic. See documentation below. Will continue twice weekly dressing changes,  but if drainage has improved with next dressing change may advance to weekly with daily dressing kit.    Vital Signs:  Doppler Pressure: 120             Automatc BP: 101/60 (83) HR: 85 SR w/ occasional PVCs SPO2: 100% on RA   Weight: 229 lbs w/o eqt Last weight: 225 lb w/o eqt Home weights: 223-227 lbs   VAD Indication: Destination Therapy d/t BMI   VAD interrogation & Equipment Management: Speed: 5800 Flow: 4.4 Power: 4.6 w    PI: 3.6   Alarms: LOW FLOW 6/2 Events: 0-10 daily; 6/3: 60 + PI events & 6/6 32 PI events  Fixed speed 5800 Low speed limit: 5500   Primary Controller:  Replace back up battery in 28 months. Back up controller:   Replace back up battery in 30 months.   Annual Equipment Maintenance on UBC/PM was performed on 09/03/21.    I reviewed the LVAD parameters from today and compared the results to the patient's prior recorded data. LVAD interrogation was NEGATIVE for  significant power changes, NEGATIVE for clinical alarms and STABLE for PI events/speed drops. No programming changes were made and pump is functioning within specified parameters. Pt is performing daily controller and system monitor self tests along with completing weekly and monthly maintenance for LVAD equipment.   LVAD equipment check completed and is in good working order. Back-up equipment present. Charged back up battery and performed self-test on equipment.    Exit Site Care: Drive line is being maintained daily by her husband [name]. Existing VAD dressing removed and site care performed using sterile technique. Drive line exit site cleaned with Chlora prep applicators x 2, allowed to dry, and gauze dressing WITHOUT Silver strip applied. Exit site healing and partially incorporated, the velour is fully implanted at exit site. Scant amount of serosanguinous drainage. No redness, tenderness, foul odor or rash noted. Drive line anchor re-applied. Pt denies fever or chills. Continue twice a week dressing changes without silver strip. May advance to weekly if drainage improves. Pt has adequate supplies for home use.     Significant Events on VAD Support:      Device: Medtronic Therapies: on VF ?240 CT 200-240 VVI: 40 Last check: 09/18/21     BP & Labs:  Doppler 120 - reflecting modified systolic   Hgb 8.1 - No S/S of bleeding. Specifically denies melena/BRBPR or nosebleeds. Pt had a heavy menstrual cycle that lasted 2 weeks. This ended 2 days ago.    LDH stable at 178 with established baseline of 250 - 450. Denies tea-colored urine. No power elevations noted on interrogation.    3 month Intermacs follow up completed including:  Quality of Life, KCCQ-12, and Neurocognitive trail making.    Pt completed 985  feet during 6 minute walk at cardiac rehab   Back up controller: Not present today   Patient Goals: Pt would like to feel well enough to be able to go to the beach this summer to  celebrate their wedding anniversary. Another big milestone for her is to be able to shower once her drive line is healed.    [place] Cardiomyopathy Questionnaire      11/27/2021   10:49 AM  KCCQ-12  1 a. Ability to shower/bathe Not at all limited  1 b. Ability to walk 1 block Moderately limited  1 c. Ability to hurry/jog Other, Did not do  2. Edema feet/ankles/legs Never over the past 2 weeks  3.  Limited by fatigue 1-2 times a week  4. Limited by dyspnea Never over the past 2 weeks  5. Sitting up / on 3+ pillows Never over the past 2 weeks  6. Limited enjoyment of life Not limited at all  7. Rest of life w/ symptoms Completely satisfied  8 a. Participation in hobbies Did not limit at all  8 b. Participation in chores Slightly limited  8 c. Visiting family/friends Did not limit at all    Plan:  Continue holding Entresto If wt hits 229-230 lbs take 1/2 tablet of Torsemide.  Coumadin dosing per Ander Purpura PharmD Return to [facility] in 1 month for follow up with Dr [name] Return to [facility] in 1 week for repeat CBC, anemia panel, and digoxin level Continue twice weekly drive line dressing changes without silver strip. When you change dressing on Monday, if drainage is minimal may advance to weekly changes with daily kit. If drainage still present continue twice weekly changes.    Miranda Monte RN [place] Coordinator  Office: [phone]  24/7 Pager: [phone]            ID:  [name], [name] [birth year], MRN [id number]    Provider location: [facility] Type of Visit: Established patient    PCP:  [name], MD    Cardiologist:  [name], MD  History of Present Illness: [name] is a 51 y.o. 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  She was followed for systolic CHF initially in [place]. Apparently her EF recovered to 35-40%.  However, she was admitted to [facility] 09/19/13 for worsening dyspnea and CP. Coronaries were reportedly OK on LHC at that time at [facility]. Echo showed EF of 15-20% with four-chamber enlargement with moderate-severe TR/MR. RHC as below. Rheumatological w/u negative.  CPX in 9/16 showed near-normal functional capacity.  Repeat echo in 6/17 shows that EF remains 20%. Repeat CPX in 1/18 was submaximal but probably only mild circulatory limitation.  Echo 8/18 showed EF 25-30%, severe LV dilation.  CPX in 6/19 showed low normal functional capacity.  Echo in 10/19 showed EF 20-25%, moderate LV dilation.    Zio patch in 2/20 with 11% PVCs and NSVT, amiodarone started. Zio patch in 3/20 with 13.3% PVCs and NSVT.  She saw Dr. [name], he thought that increased PVCs were electromechanical in nature due to worsening heart failure.  Repeat Zio patch on amiodarone in 1/21 showed 6.2% PVCs.   She was admitted in 4/20 with VT in setting of hypokalemia.    CPX in 9/20 showed mild functional limitation, more due to body habitus than heart failure.  Echo in 4/21 showed EF 20% with severe LV dilation, diffuse hypokinesis, normal RV size and systolic function.   In 8/21, she had a shock for VF in the setting of hypokalemia.   Patient was admitted in 10/21 with CVA involving left MCA territory.  This was thought to be cardioembolic but LV thrombus was not visualized and no atrial fibrillation has been seen.  Echo in 10/21 showed EF 20-25%, severe LV dilation (no LV thrombus seen), mildly decreased RV systolic function, mild-moderate MR. She was taken by IR for thrombectomy and stent placement, now on ASA 81 and ticagrelor.   She had recurrent VT in 2/22, quinidine was added by EP.  Spironolactone and Coreg were decreased due to low BP and lightheadedness.  Echo in 4/22 showed EF 25% with severe LV dilation and global hypokinesis, normal RV, mild-moderate MR.   CPX (8/22) showed peak VO2 14.3, VE/VCO2 slope 42, RER 1.09 => mild to  moderate HF limitation, elevated VE/VCO2 slope is likely false from end exercise hyperventilation. Restrictive lung physiology from obesity.   Patient was admitted in 3/23 with recurrent CHF exacerbation with low cardiac output.  She was also noted to be hyperthyroidism but amiodarone had to be continued due to VT and poorly tolerated atrial fibrillation.  She required Impella 5.5 placement and eventual HM3 LVAD placement on 09/04/21.  Post-op course complicated by atrial fibrillation, malnutrition, and deconditioning.  She went to CIR prior to discharge.   She was seen in May and started on entresto. Amio was stopped.   On 6/2 entresto and torsemide stopped due to low flow and dizziness.   Today she returns for HF follow up.Overall feeling ok. Says she just completed heavy menstrual cycle that last 14 days. Denies SOB/PND/Orthopnea. Appetite ok. No fever or chills. Less drainage around driveline. Weight at home 223-227 pounds. Taking all medications.   LVAD Interrogation HM III- See above LVAD coordinator note.  Speed: 5800 Flow: 4.3 PI: 3.5 Power: 4.5 PI events 0-10 daily 11/23/21 60 + PI events  and 11/26/21 32 PI events.   Labs: (11/09/13): Dig level 0.5, K 3.6, Creatinine 0.83, pro-BNP 622 (12/19/13): K 3.6, creatinine 0.8 (7/15): K 4.1, creatinine 0.91 (8/15): K 3.9, creatinine 0.91 (10/15): K 4, creatinine 0.93, proBNP 129 (12/15): K 4, creatinine 0.9, digoxin 0.4 (5/16): K 4, creatinine 0.86 (9/16): K 3.6, creatinine 0.86, digoxin < 0.2 (5/17): K 4.1, creatinine 0.95, digoxin 0.3 (5/18): K 3.4, creatinine 0.95, TSH elevated but free T3 and free T4 normal.  (8/18): K 3.9, creatinine 0.87, BNP 108  (11/18): K 3.5, creatinine 1.04, digoxin 0.4 (1/19): LDL 91, HDL 47 (3/19): digoxin 0.7, K 4, creatinine 0.99 (4/19): TSH normal, LDL 95 (9/19): LDL 99, HDL 45, K 4, creatinine 0.96 (10/19): K 3.6, creatinine 0.85, digoxin 0.5 (2/20): LDL 103 (3/20): K 3.9, creatinine 1.05 (4/20): K 3.9,  creatinine 1.23 => 1.15, TSH 8, digoxin 0.3, LFTs normal 6/20): K 4.3, creatinine 1.07, LFTs normal, digoxin 0.5 (7/20): K 3.8, creatinine 1.16 (8/20): digoxin < 0.2, LDL 135 (1/21): K 3.9, creatinine 1.05, LDL 171  (4/21): K 4.2, creatinine 0.95, LDL 127, TSH normal, LFTs normal (10/21): K 2.8, creatinine 1.21, LDL 130 (11/21): K 4.5, creatinine 1.3 => 1.2, LFTs normal, digoxin 1.3, TSH normal (3/22): K 4.1, creatinine 1.36 (4/22): hgb 10.8 (7/22): Transferrin 8%, digoxin 0.9, hgb 10.1, K 3.9, creatinine 1.42 (8/22): digoxin 0.4 (9/22): K 4.2, creatinine 1.32 (4/23): hgb 11, WBCs 13, K 3.9 => 3.8, creatinine 0.9 => 0.98, albumin 3.2, ALT 45, AST 31, LDH 272 => 244, TSH 0.235, free T4 1.84  PMH: 1. OSA: Using CPAP.  2. PVCs:  - Zio patch 2/20: 11% PVCs - Zio patch 3/20: 13.3% PVCs - Zio patch 1/21: 6.2% PVCs 3. VT/VF: 8/21, in setting of hypokalemia.  2/22 VT, quinidine added.  4. 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  No LV thrombus visualized and atrial fibrillation has not been visualized.  - IR thrombectomy with stent placement in 10/21.  5. Chronic systolic CHF: Nonischemic cardiomyopathy:   - Coronary angiography 2015 at [facility]: Normal coronaries.  - Echo (8/15) with EF 20%, moderate LV dilation, diffuse hypokinesis, normal RV size with mildly decreased systolic function, mild MR.  - Echo (5/16) with EF 20%,  spherical LV with diffuse hypokinesis, mild MR.  - Echo (6/17) with EF 20%, diffuse hypokinesis, moderate LV dilation, normal RV, mild to moderate MR.  - RHC (12/17): mean RA 5, PA 56/31, mean PCWP 24, CI 2, PVR 3.7 WU - Echo (8/18) with EF 25-30%, severe LV dilation, severe LAE.  - Echo (2/20) with EF 20-25%, moderate LV dilation, moderate MR.  - Echo (4/21) with EF 20-25%, severe LV dilation, mild-moderate MR, normal RV, PASP 26 mmHg.  - Echo (10/21) with EF 20-25%, severe LV dilation (no LV thrombus seen), mildly decreased RV systolic function, mild-moderate MR. - CPX in 9/20  showed mild functional limitation, more due to body habitus than heart failure.  - CPX in 8/21: Peak VO2 11.6, VE/VCO2 32, RER 1.2.  Mild HF limitation.  - Echo (4/22): EF 25% with severe LV dilation and global hypokinesis, normal RV, mild-moderate MR.  - CPX (8/22): peak VO2 14.3, VE/VCO2 slope 42, RER 1.09 => mild to moderate HF limitation, elevated VE/VCO2 slope is likely false from end exercise hyperventilation. Restrictive lung physiology from obesity.  - HM3 LVAD placement 09/04/21.  6. PFTs (1/22): Near normal.  7. Fe deficiency anemia 8. 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     Current Outpatient Medications  Medication Sig Dispense Refill   aspirin EC 81 MG tablet Take 1 tablet (81 mg total) by mouth daily. Swallow whole. 90 tablet 3   digoxin (LANOXIN) 0.125 MG tablet Take 1 tablet (0.125 mg) by mouth once daily 30 tablet 6   methimazole (TAPAZOLE) 10 MG tablet Take 1 tablet (10 mg total) by mouth 3 (three) times daily. 270 tablet 1   potassium chloride SA (KLOR-CON M) 20 MEQ tablet Take 3 tablets (60 mEq total) by mouth 2 (two) times daily. 180 tablet 0   predniSONE (DELTASONE) 10 MG tablet Take 1.5 tablets (15 mg total) by mouth daily with breakfast. 135 tablet 1   quiniDINE gluconate 324 MG CR tablet Take 1 tablet (324 mg total) by mouth 2 (two) times daily. 60 tablet 0   rosuvastatin (CRESTOR) 40 MG tablet Take 1 tablet (40 mg total) by mouth at bedtime. 30 tablet 0   sildenafil (REVATIO) 20 MG tablet Take 1 tablet (20 mg total) by mouth 3 (three) times daily. 90 tablet 6   spironolactone (ALDACTONE) 25 MG tablet Take 0.5 tablets (12.5 mg total) by mouth daily. 45 tablet 3   warfarin (COUMADIN) 1 MG tablet Take 2 mg (2 tab) every day or as directed by HF Clinic (Patient taking differently: Take 2 mg every day except 4 mg on MWF, or as directed by HF Clinic) 120 tablet 11   zinc gluconate 50 MG tablet Take 1 tablet (50 mg total) by  mouth daily. 30 tablet 6   enoxaparin (LOVENOX) 40 MG/0.4ML injection Inject 0.4 mLs (40 mg total) into the skin every 12 (twelve) hours. (Patient not taking: Reported on 11/27/2021) 24 mL 0   pantoprazole (PROTONIX) 40 MG tablet Take 1 tablet (40 mg total) by mouth daily. (Patient not taking: Reported on 11/27/2021) 30 tablet 0   sacubitril-valsartan (ENTRESTO) 24-26 MG Take 1 tablet by mouth 2 (two) times daily. (Patient not taking: Reported on 11/27/2021) 60 tablet 6   torsemide (DEMADEX) 20 MG tablet Take 40 mg by mouth daily. (Patient not taking: Reported on 11/27/2021)     No current facility-administered medications for this encounter.    Allergies:   Patient has no known allergies.   Social  History:  The patient  reports that she has never smoked. She has never used smokeless tobacco. She reports that she does not drink alcohol and does not use drugs.   Family History:  The patient's family history includes Heart disease in her maternal grandmother and mother; High blood pressure in her father; Obesity in her mother; Stroke in her father.   ROS:  Please see the history of present illness.   All other systems are personally reviewed and negative.   Exam:   BP 101/60 Comment: map 83  Pulse 85 Comment: SR with occasional PVCs  Wt 103.9 kg (229 lb)   SpO2 100%   BMI 40.57 kg/m  Wt Readings from Last 3 Encounters:  11/27/21 103.9 kg (229 lb)  11/26/21 103.4 kg (228 lb)  11/13/21 104.7 kg (230 lb 12.8 oz)  Physical Exam: GENERAL: No acute distress.Walked in the clinic HEENT: normal  NECK: Supple, JVP flat  2+ bilaterally, no bruits.  No lymphadenopathy or thyromegaly appreciated.   CARDIAC:  Mechanical heart sounds with LVAD hum present.  LUNGS:  Clear to auscultation bilaterally.  ABDOMEN:  Soft, round, nontender, positive bowel sounds x4.     LVAD exit site: Minimal drainage on the dressing.   No erythema or drainage.  Stabilization device present and accurately applied.    EXTREMITIES:  Warm and dry, no cyanosis, clubbing, rash or edema  NEUROLOGIC:  Alert and oriented x 3.    No aphasia.  No dysarthria.  Affect pleasant.     Recent Labs: 09/17/2021: B Natriuretic Peptide 279.4 09/24/2021: Magnesium 1.9 10/07/2021: ALT 45 10/28/2021: BUN 8; Creatinine, Ser 0.85; Potassium 3.3; Sodium 138; TSH 0.013 11/27/2021: Hemoglobin 8.1; Platelets 326  Personally reviewed   Wt Readings from Last 3 Encounters:  11/27/21 103.9 kg (229 lb)  11/26/21 103.4 kg (228 lb)  11/13/21 104.7 kg (230 lb 12.8 oz)    ASSESSMENT AND PLAN:   1. Chronic systolic HF: Nonischemic cardiomyopathy.  She has a Medtronic ICD. Cause for cardiomyopathy is uncertain, initially identified peri-partum (after son's birth), so cannot rule out peri-partum CMP.  On and off, she has had frequent PVCs.   RHC in 8/15 showed normal filling pressures and preserved cardiac output. Echo in 4/22 with EF 25%, severe LV dilation.  CPX (8/22) with mild-moderate HF limitation.  Echo in 3/23 with EF < 20% with severe dilation, normal RV size with mildly decreased systolic function, moderate-severe functional MR with dilated IVC. 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 Impella 5.5 placed 08/29/21.  Patient had HM3 LVAD placed 09/04/21 and speed adjusted by ramp echo up to 5800 rpm.  - NYHA II. Volume status stable. Hold off on scheduled diuretics. Instructed to take 10 mg torsemide as needed for weight 229-230. - Hold off on entresto for now due to dizziness.  - Continue spironolactone 12.5 mg daily.  - Continue Digoxin 0.125 mg, increased by EP.    - She should continue ASA 81 mg daily with history of CVA.  - Continue sildenafil 20 mg tid  - Warfarin for VAD, goal INR 2-2.5.   2. 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  She had been on  Levoxyl at home prior to 3/23 admission, noted to be hyperthyroid in 3/23.  This likely contributes to her CHF. She had recurrent VT and poorly tolerated AF which made it  difficult for [name] to stop amiodarone. Initially treated w/ IV Solumedrol  -> transitioned to prednisone and methimazole.  She was seen recently by endocrinology, methimazole and prednisone were increased.  - Management per endocrinology.  -Now off amiodarone. 3. PVCs/VT: EP consulted given complicated situation with hyperthyroidism and VT. They favored continuing amiodarone and quinidine at the time.  Would eventually like to stop amiodarone given hyperthyroidism.  - Continue quinidine.  - AMiodarone stopped in May 2023 4. CVA: CVA in 10/21, had thrombectomy with stent placement.  No LV thrombus noted and no atrial fibrillation noted.  - Would continue ASA 81 daily.   5. Anemia: Post-op, improved.  6. CKD stage 3: Creatinine recently improved.   7. Mitral regurgitation: Moderate-severe functional MR pre-LVAD.  Unlikely to be Mitraclip candidate with severely dilated LV (>7 cm). Hopefully will improve with LVAD.  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  Unable to get MRI with ICD. Seen by GYN. Felt to be most likely non-neoplastic. Tumor markers within normal range. Possible low grade malignancy, could also be benign.  No ascites noted on abdominal CT and no mention of abdominal spread.  She was seen by OB/gyn, best course likely would be surgery to remove ovarian mass. - Would like her to wait about 6 months post-LVAD as ovarian surgery is not urgent.  At that time, reasonable to operate to remove mass.  9. Elevated WBCs: Thought to be due to prednisone. 10. Malnutrition: Appetite improving. This is improving.   11. Atrial fibrillation: Tolerates poorly.   - No recent  A fib.  -  continue digoxin 0.125 mg daily.  12. Deconditioning: Startws cardiac  rehab at [facility].  13. OSA: She has not been using CPAP.  Has seen Dr. [name] who recommended home sleep study.  14. Anemia  CBC today Hgb down to 8.1 from previous 11.8. Suspect due to heavy cycle.     Check CBC, LDH, INR, and BMET INR on 2.1 LDH 178  and BMET stable. .  Hgb down to 8.1 ? Possible related to heavy cycle. Repeat CBC next week. If she remains 8.1  or less will need to transfuse.   F/U next week for CBC /type and screen./ anemia panel. If stable return to clinic in 1 month.   Darrick Grinder, NP  11/27/2021  [facility] [address] [facility] [place] [address] [phone] (office) [phone] (fax)

## 2021-11-27 NOTE — Progress Notes (Signed)
Gynecologic Oncology Telehealth Note: Gyn-Onc  I connected with [name] on 11/28/21 at  4:40 PM EDT by telephone and verified that I am speaking with the correct person using two identifiers.  I discussed the limitations, risks, security and privacy concerns of performing an evaluation and management service by telemedicine and the availability of in-person appointments. I also discussed with the patient that there may be a patient responsible charge related to this service. The patient expressed understanding and agreed to proceed.  Other persons participating in the visit and their role in the encounter: None.  Patient's location: Home Provider's location: [facility]  Reason for Visit: Follow-up recent imaging  Treatment History: The patient was recently admitted from 3/6 to 3/31 with low output heart failure in the setting of hyperthyroidism.  Ultimately underwent placement of an LVAD on 3/14.  She was discharged to acute inpatient rehab at the end of March and discharged from rehab on 4/6. During her hospital stay, CT scan was performed with incidental finding of a pelvic mass.  Further work-up and treatment was deferred in the setting of her acute cardiac issues.   CT of the abdomen and pelvis on 08/26/2021 with incidental finding of a 11.7 x 4.3 x 7.4 cm low-attenuation mass within the left adnexa.  Uterus and right ovary unremarkable in appearance.  No significant volume of ascites, no adenopathy.  The findings included cardiomegaly, mild interstitial pulmonary edema in the lung bases, biliary sludge, and a 5 mm nonobstructive calculus in the lower pole of the collecting system of the right kidney. Pelvic ultrasound exam on 08/27/2021 shows a 12 x 9.4 x 4.8 cm complex multiseptated cystic mass replacing the left adnexa.  Right ovary normal in appearance measuring up to 2.9 cm. Tumor markers obtained on 08/26/2021 were notable for a normal CEA of 1.1 and CA-125 of  17.3.  Interval History: Patient reports overall doing well since her visit with me.  She finally had resumption of menses after surgery, notes that it was quite heavy.  She bled for about 2 weeks and stopped this past Monday.  Feeling more tired, lightheaded.  Denies any abdominal or pelvic pain.  Past Medical/Surgical History: Past Medical History:  Diagnosis Date   Automatic implantable cardioverter-defibrillator in situ    Chronic CHF (congestive heart failure) ([place])    a. EF 15-20% b. RHC (09/2013) RA 14, RV 57/22, PA 64/36 (48), PCWP 18, FIck CO/CI 3.7/1.6, PVR 8.1 WU, PA sat 47%    History of hypothyroidism    History of stomach ulcers    Hyperthyroidism    Hypotension    Morbid obesity ([place])    Myocardial infarction ([place]) 08/2013   Nocturnal dyspnea    Nonischemic cardiomyopathy ([place])    Sinus tachycardia    Sleep apnea    Snoring-prob OSA 09/04/2011   Sprint Fidel[employer] ICD lead RECALL  6949    Stroke ([place]) 2021   some residual right-sided weakness   UARS (upper airway resistance syndrome) 09/04/2011   HST 12/2013:  AHI 4/hr (numerous episodes of airflow reduction that did not have concomitant desaturation)     Past Surgical History:  Procedure Laterality Date   BREATH TEK H PYLORI N/A 11/09/2014   Procedure: BREATH TEK H PYLORI;  Surgeon: Greer Pickerel, MD;  Location: [facility];  Service: General;  Laterality: N/A;   CARDIAC CATHETERIZATION  ~ 2006; 09/2013   CARDIAC CATHETERIZATION N/A 05/23/2016   Procedure: Right Heart Cath;  Surgeon: Larey Dresser,  MD;  Location: [facility];  Service: Cardiovascular;  Laterality: N/A;   CARDIAC DEFIBRILLATOR PLACEMENT  2006; 12/26/2013   Medtronic [id number]; 12-2013 ICD gen change and RV lead revision with new 6935 RV lead by Dr [name]   CENTRAL LINE INSERTION  08/28/2021   Procedure: CENTRAL LINE INSERTION;  Surgeon: Larey Dresser, MD;  Location: [facility];  Service: Cardiovascular;;   CESAREAN SECTION  1999    IMPLANTABLE CARDIOVERTER DEFIBRILLATOR GENERATOR CHANGE N/A 12/26/2013   Procedure: IMPLANTABLE CARDIOVERTER DEFIBRILLATOR GENERATOR CHANGE;  Surgeon: Deboraha Sprang, MD;  Location: [facility];  Service: Cardiovascular;  Laterality: N/A;   INSERTION OF IMPLANTABLE LEFT VENTRICULAR ASSIST DEVICE N/A 09/03/2021   Procedure: INSERTION OF IMPLANTABLE LEFT VENTRICULAR ASSIST DEVICE/ Heartmate 3;  Surgeon: Dahlia Byes, MD;  Location: [place];  Service: Open Heart Surgery;  Laterality: N/A;   IR CT HEAD LTD  04/17/2020   IR CT HEAD LTD  04/17/2020   IR INTRA CRAN STENT  04/17/2020   IR PERCUTANEOUS ART THROMBECTOMY/INFUSION INTRACRANIAL INC DIAG ANGIO  04/17/2020   IR RADIOLOGIST EVAL & MGMT  06/05/2020   LEAD REVISION N/A 12/26/2013   Procedure: LEAD REVISION;  Surgeon: Deboraha Sprang, MD;  Location: [facility];  Service: Cardiovascular;  Laterality: N/A;   PLACEMENT OF IMPELLA LEFT VENTRICULAR ASSIST DEVICE Right 08/29/2021   Procedure: PLACEMENT OF IMPELLA 5.5 LEFT VENTRICULAR ASSIST DEVICE;  Surgeon: Gaye Pollack, MD;  Location: [place];  Service: Open Heart Surgery;  Laterality: Right;   RADIOLOGY WITH ANESTHESIA N/A 04/17/2020   Procedure: Code Stroke;  Surgeon: Luanne Bras, MD;  Location: [place];  Service: Radiology;  Laterality: N/A;   REMOVAL OF IMPELLA LEFT VENTRICULAR ASSIST DEVICE N/A 09/03/2021   Procedure: REMOVAL OF IMPELLA LEFT VENTRICULAR ASSIST DEVICE;  Surgeon: Dahlia Byes, MD;  Location: [place];  Service: Open Heart Surgery;  Laterality: N/A;   RIGHT HEART CATH N/A 08/28/2021   Procedure: RIGHT HEART CATH;  Surgeon: Larey Dresser, MD;  Location: [place] [facility];  Service: Cardiovascular;  Laterality: N/A;   RIGHT HEART CATHETERIZATION N/A 02/15/2014   Procedure: RIGHT HEART CATH;  Surgeon: Larey Dresser, MD;  Location: [facility] C[facility] LAB;  Service: Cardiovascular;  Laterality: N/A;   TEE WITHOUT CARDIOVERSION N/A 08/29/2021   Procedure: TRANSESOPHAGEAL ECHOCARDIOGRAM (TEE);   Surgeon: Gaye Pollack, MD;  Location: [place];  Service: Open Heart Surgery;  Laterality: N/A;   TEE WITHOUT CARDIOVERSION N/A 09/03/2021   Procedure: TRANSESOPHAGEAL ECHOCARDIOGRAM (TEE);  Surgeon: Dahlia Byes, MD;  Location: [place];  Service: Open Heart Surgery;  Laterality: N/A;   TUBAL LIGATION  1999    Family History  Problem Relation Age of Onset   Heart disease Mother    Obesity Mother    High blood pressure Father    Stroke Father    Heart disease Maternal Grandmother    Colon cancer Neg Hx    Breast cancer Neg Hx    Ovarian cancer Neg Hx    Endometrial cancer Neg Hx    Pancreatic cancer Neg Hx    Prostate cancer Neg Hx     Social History   Socioeconomic History   Marital status: Married    Spouse name: Chloe Flis   Number of children: 2   Years of education: Not on file   Highest education level: Not on file  Occupational History   Occupation: home maker    Employer: [employer]  Tobacco Use  Smoking status: Never   Smokeless tobacco: Never  Vaping Use   Vaping Use: Never used  Substance and Sexual Activity   Alcohol use: No   Drug use: No   Sexual activity: Not Currently  Other Topics Concern   Not on file  Social History Narrative   [place] sometimes   Right handed   Social Determinants of Health   Financial Resource Strain: Low Risk  (08/28/2021)   Overall Financial Resource Strain (CARDIA)    Difficulty of Paying Living Expenses: Not very hard  Food Insecurity: No Food Insecurity (08/28/2021)   Hunger Vital Sign    Worried About Running Out of Food in the Last Year: Never true    Ran Out of Food in the Last Year: Never true  Transportation Needs: No Transportation Needs (08/28/2021)   PRAPARE - Hydrologist (Medical): No    Lack of Transportation (Non-Medical): No  Physical Activity: Not on file  Stress: Not on file  Social Connections: Not on file    Current Medications:  Current Outpatient Medications:    aspirin  EC 81 MG tablet, Take 1 tablet (81 mg total) by mouth daily. Swallow whole., Disp: 90 tablet, Rfl: 3   digoxin (LANOXIN) 0.125 MG tablet, Take 1 tablet (0.125 mg) by mouth once daily, Disp: 30 tablet, Rfl: 6   enoxaparin (LOVENOX) 40 MG/0.4ML injection, Inject 0.4 mLs (40 mg total) into the skin every 12 (twelve) hours. (Patient not taking: Reported on 11/27/2021), Disp: 24 mL, Rfl: 0   methimazole (TAPAZOLE) 10 MG tablet, Take 1 tablet (10 mg total) by mouth 3 (three) times daily., Disp: 270 tablet, Rfl: 1   pantoprazole (PROTONIX) 40 MG tablet, Take 1 tablet (40 mg total) by mouth daily. (Patient not taking: Reported on 11/27/2021), Disp: 30 tablet, Rfl: 0   potassium chloride SA (KLOR-CON M) 20 MEQ tablet, Take 3 tablets (60 mEq total) by mouth 2 (two) times daily., Disp: 180 tablet, Rfl: 0   predniSONE (DELTASONE) 10 MG tablet, Take 1.5 tablets (15 mg total) by mouth daily with breakfast., Disp: 135 tablet, Rfl: 1   quiniDINE gluconate 324 MG CR tablet, Take 1 tablet (324 mg total) by mouth 2 (two) times daily., Disp: 60 tablet, Rfl: 0   rosuvastatin (CRESTOR) 40 MG tablet, Take 1 tablet (40 mg total) by mouth at bedtime., Disp: 30 tablet, Rfl: 0   sacubitril-valsartan (ENTRESTO) 24-26 MG, Take 1 tablet by mouth 2 (two) times daily. (Patient not taking: Reported on 11/27/2021), Disp: 60 tablet, Rfl: 6   sildenafil (REVATIO) 20 MG tablet, Take 1 tablet (20 mg total) by mouth 3 (three) times daily., Disp: 90 tablet, Rfl: 6   spironolactone (ALDACTONE) 25 MG tablet, Take 0.5 tablets (12.5 mg total) by mouth daily., Disp: 45 tablet, Rfl: 3   torsemide (DEMADEX) 20 MG tablet, Take 40 mg by mouth daily. (Patient not taking: Reported on 11/27/2021), Disp: , Rfl:    warfarin (COUMADIN) 1 MG tablet, Take 2 mg (2 tab) every day or as directed by HF Clinic (Patient taking differently: Take 2 mg every day except 4 mg on MWF, or as directed by HF Clinic), Disp: 120 tablet, Rfl: 11   zinc gluconate 50 MG tablet, Take 1  tablet (50 mg total) by mouth daily., Disp: 30 tablet, Rfl: 6  Review of Symptoms: Pertinent positives as per HPI.  Physical Exam: There were no vitals taken for this visit. For given limitations of phone visit.  Laboratory &  Radiologic Studies: Pelvic ultrasound 6/5: IMPRESSION: Unremarkable uterus, endometrial complex and RIGHT ovary.   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   In the absence of being able to characterize this lesion by MR due to presence of a pacemaker, recommend surgical evaluation to exclude cystic ovarian neoplasm.   These results will be called to the ordering clinician or representative by the Radiologist Assistant, and communication documented in the PACS or [employer].  Assessment & Plan: AYLAH YEARY is a 51 y.o. woman with significant medical comorbidities found to have a complex adnexal mass.  Patient overall is doing well, remains relatively asymptomatic.  Discussed findings on ultrasound which show minimal increase in size of her mass with continued findings that raise the concern for possibility of cancer.  MRI would be recommended although the patient is not able to undergo an MRI.  She has seen her cardiologist since I first met with her.  Recommendation is that she should not undergo surgery for 6 months after her LVAD procedure.  This would be in mid September.  I recommended that we repeat a pelvic ultrasound in mid August and then plan to schedule a tentative date for surgery once we have the results.  Patient endorses some symptoms of anemia.  She notes that she has reached out to her cardiologist with plan to get blood work.  I reviewed that her uterus is normal-appearing on ultrasound.  Given her comorbidities, she would not be a good candidate for  estrogen-containing hormone suppression, but could be placed on even a low-dose progesterone to help keep her lining thin and prevent future heavy menses.  I discussed the assessment and treatment plan with the patient. The patient was provided with an opportunity to ask questions and all were answered. The patient agreed with the plan and demonstrated an understanding of the instructions.   The patient was advised to call back or see an in-person evaluation if the symptoms worsen or if the condition fails to improve as anticipated.   12 minutes of total time was spent for this patient encounter, including preparation, phone counseling with the patient and coordination of care, and documentation of the encounter.   Jeral Pinch, MD  Division of Gynecologic Oncology  Department of Obstetrics and Gynecology  [facility]

## 2021-11-27 NOTE — Patient Instructions (Addendum)
Continue holding Entresto If wt hits 229-230 lbs take 1/2 tablet of Torsemide. You do not need to take this daily Coumadin dosing per [name] Return to [place] clinic in 1 month for follow up with Dr [name] Return to [place] clinic in 1 week for repeat CBC, anemia panel, and digoxin level Continue twice weekly drive line dressing changes without silver strip. When you change dressing on Monday, if drainage is minimal may advance to weekly changes with daily kit. If drainage still present continue twice weekly changes.

## 2021-11-27 NOTE — Progress Notes (Signed)
LVAD INR 

## 2021-11-27 NOTE — Progress Notes (Signed)
Patient presents for 1 month f/u up with 3 month Intermacs in [facility] today with her husband [name]. Reports no problems with VAD equipment or concerns with drive line.  Pt states that she is feeling pretty good since her last visit. She is started to get out and walk some outside. Denies shortness of breath, heart failure symptoms, and signs of GI bleeding. Reports mild lightheadedness and dizziness that she describes as "almost constant." Reports she has had a very heavy menstrual cycle over the last 2 weeks that ended 2 days ago. Hgb is 8.1 today (down from 11.8 one month ago). Plan to recheck CBC, anemia panel, and digoxin level next week.   Pt confirmed that she stopped Amiodarone as instructed at last visit. Confirms she has held Torsemide and Entresto since Friday 6/2 after calling with a LOW FLOW. No further LOW FLOWs noted. BP stable today. Wt up 4 lbs. Per Amy NP pt may take 1/2 tablet of Torsemide if wt hits 229 - 230 lbs. Pt to call if short of breath, palpations, or VAD alarms occur. She verbalized understanding.   Pt had appt with Dr [name] yesterday: "Given the presence of the LVAD and the history of shock therapy, we will eliminate the shocks in the VT zone-make sure to mostly be awake with recurrent ventricular tachycardia given the presence of the LVAD.  At that point she can be sedated and shocked semi-emergently." Digoxin increased to 0.125 mg daily per Dr [name]. Dr [name] aware and in agreement.   Pt has seen Endo and OB/GYN. Endo recently increased Methimazole based off lab results. She has Endo follow up scheduled 12/26/21. 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 Dr [name] informed the pt that we would need to wait 6 months prior to her having surgery this date would be 03/06/22. She has had a very heavy menstrual cycle this month with almost 4 gm drop in Hgb. She has an appt with her OB/GYN today and will discuss this with her. They will also review recent pelvic ultrasound  results:   Unremarkable uterus, endometrial complex and RIGHT ovary.   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   In the absence of being able to characterize this lesion by MR due to presence of a pacemaker, recommend surgical evaluation to exclude cystic ovarian neoplasm.   Scheduled for home sleep study 12/10/21 per Dr [name].   Pt participating in cardiac rehab. She is enjoying this so far.   Drive line dressing change completed twice weekly at home per [place]. Dressing change today in clinic. See documentation below. Will continue twice weekly dressing changes, but if drainage has improved with next dressing change may advance to weekly with daily dressing kit.   Vital Signs:  Doppler Pressure: 120  Automatc BP: 101/60 (83) HR: 85 SR w/ occasional PVCs SPO2: 100% on RA   Weight: 229 lbs w/o eqt Last weight: 225 lb w/o eqt Home weights: 223-227 lbs  VAD Indication: Destination Therapy d/t BMI   VAD interrogation & Equipment Management: Speed: 5800 Flow: 4.4 Power: 4.6 w    PI: 3.6   Alarms: LOW FLOW 6/2 Events: 0-10 daily; 6/3: 60 + PI events & 6/6 32 PI events  Fixed speed 5800 Low speed limit: 5500   Primary Controller:  Replace back up battery  in 28 months. Back up controller:   Replace back up battery in 30 months.   Annual Equipment Maintenance on UBC/PM was performed on 09/03/21.    I reviewed the LVAD parameters from today and compared the results to the patient's prior recorded data. LVAD interrogation was NEGATIVE for significant power changes, NEGATIVE for clinical alarms and STABLE for PI events/speed drops. No programming changes were made and pump is functioning within specified parameters. Pt is performing daily controller and system monitor self tests along with completing  weekly and monthly maintenance for LVAD equipment.   LVAD equipment check completed and is in good working order. Back-up equipment present. Charged back up battery and performed self-test on equipment.    Exit Site Care: Drive line is being maintained daily by her husband [name]. Existing VAD dressing removed and site care performed using sterile technique. Drive line exit site cleaned with Chlora prep applicators x 2, allowed to dry, and gauze dressing WITHOUT Silver strip applied. Exit site healing and partially incorporated, the velour is fully implanted at exit site. Scant amount of serosanguinous drainage. No redness, tenderness, foul odor or rash noted. Drive line anchor re-applied. Pt denies fever or chills. Continue twice a week dressing changes without silver strip. May advance to weekly if drainage improves. Pt has adequate supplies for home use.    Significant Events on VAD Support:    Device: Medtronic Therapies: on VF ?240 CT 200-240 VVI: 40 Last check: 09/18/21   BP & Labs:  Doppler 120 - reflecting modified systolic   Hgb 8.1 - No S/S of bleeding. Specifically denies melena/BRBPR or nosebleeds. Pt had a heavy menstrual cycle that lasted 2 weeks. This ended 2 days ago.    LDH stable at 178 with established baseline of 250 - 450. Denies tea-colored urine. No power elevations noted on interrogation.   3 month Intermacs follow up completed including:  Quality of Life, KCCQ-12, and Neurocognitive trail making.   Pt completed 985  feet during 6 minute walk at cardiac rehab  Back up controller: Not present today  Patient Goals: Pt would like to feel well enough to be able to go to the beach this summer to celebrate their wedding anniversary. Another big milestone for her is to be able to shower once her drive line is healed.   [place] Cardiomyopathy Questionnaire     11/27/2021   10:49 AM  KCCQ-12  1 a. Ability to shower/bathe Not at all limited  1 b. Ability to walk  1 block Moderately limited  1 c. Ability to hurry/jog Other, Did not do  2. Edema feet/ankles/legs Never over the past 2 weeks  3. Limited by fatigue 1-2 times a week  4. Limited by dyspnea Never over the past 2 weeks  5. Sitting up / on 3+ pillows Never over the past 2 weeks  6. Limited enjoyment of life Not limited at all  7. Rest of life w/ symptoms Completely satisfied  8 a. Participation in hobbies Did not limit at all  8 b. Participation in chores Slightly limited  8 c. Visiting family/friends Did not limit at all    Plan:  Continue holding Entresto If wt hits 229-230 lbs take 1/2 tablet of Torsemide.  Coumadin dosing per Ander Purpura PharmD Return to [place] clinic in 1 month for follow up with Dr [name] Return to [place] clinic in 1 week for repeat CBC, anemia panel, and digoxin level Continue twice weekly drive line dressing changes without silver strip. When  you change dressing on Monday, if drainage is minimal may advance to weekly changes with daily kit. If drainage still present continue twice weekly changes.   [name] RN [place] Coordinator  Office: [phone]  24/7 Pager: [phone]

## 2021-11-28 ENCOUNTER — Other Ambulatory Visit (HOSPITAL_COMMUNITY): Payer: Self-pay | Admitting: *Deleted

## 2021-11-28 ENCOUNTER — Telehealth: Payer: Self-pay | Admitting: *Deleted

## 2021-11-28 ENCOUNTER — Encounter: Payer: Self-pay | Admitting: Gynecologic Oncology

## 2021-11-28 ENCOUNTER — Other Ambulatory Visit (HOSPITAL_COMMUNITY): Payer: Self-pay | Admitting: Cardiology

## 2021-11-28 DIAGNOSIS — Z7901 Long term (current) use of anticoagulants: Secondary | ICD-10-CM

## 2021-11-28 DIAGNOSIS — I5022 Chronic systolic (congestive) heart failure: Secondary | ICD-10-CM

## 2021-11-28 DIAGNOSIS — Z95811 Presence of heart assist device: Secondary | ICD-10-CM

## 2021-11-28 DIAGNOSIS — D649 Anemia, unspecified: Secondary | ICD-10-CM

## 2021-11-28 NOTE — Telephone Encounter (Signed)
Per Dr Berline Lopes scheduled the patient for an [name] scan on 8/14 with a phone visit on 8/16. Spoke with the patient and gave the appt times and dates

## 2021-11-29 ENCOUNTER — Encounter: Payer: BC Managed Care – PPO | Attending: Cardiology | Admitting: *Deleted

## 2021-11-29 DIAGNOSIS — I5022 Chronic systolic (congestive) heart failure: Secondary | ICD-10-CM | POA: Diagnosis not present

## 2021-11-29 DIAGNOSIS — Z95811 Presence of heart assist device: Secondary | ICD-10-CM | POA: Diagnosis not present

## 2021-11-29 DIAGNOSIS — I5042 Chronic combined systolic (congestive) and diastolic (congestive) heart failure: Secondary | ICD-10-CM | POA: Diagnosis not present

## 2021-11-29 NOTE — Progress Notes (Signed)
Daily Session Note  Patient Details  Name: [name] MRN: [id number] Date of Birth: [birth year] Referring Provider:   Flowsheet Row Cardiac Rehab from 11/13/2021 in [facility] Cardiac and Pulmonary Rehab  Referring Provider Lanette Hampshire MD       Encounter Date: 11/29/2021  Check In:  Session Check In - 11/29/21 0745       Check-In   Supervising physician immediately available to respond to emergencies See telemetry face sheet for immediately available ER MD    Location [facility]-Cardiac & Pulmonary Rehab    Staff Present Heath Lark, RN, BSN, CCRP;Jessica [place], MA, RCEP, CCRP, CCET;Joseph [place], [place]    Virtual Visit No    Medication changes reported     Yes    Comments Entresto and Torsemide stoppped at this moment    Fall or balance concerns reported    No    Warm-up and Cool-down Performed on first and last piece of equipment    Resistance Training Performed Yes    VAD Patient? Yes    PAD/SET Patient? No      VAD patient   Has back up controller? Yes    Has spare charged batteries? Yes    Has battery cables? Yes    Has compatible battery clips? Yes      Pain Assessment   Currently in Pain? No/denies                Social History   Tobacco Use  Smoking Status Never  Smokeless Tobacco Never    Goals Met:  Exercise tolerated well No report of concerns or symptoms today  Goals Unmet:  Not Applicable  Comments: Pt able to follow exercise prescription today without complaint.  Will continue to monitor for progression.    Dr. Emily Filbert is Medical Director for [name].  Dr. Ottie Glazier is Medical Director for [facility] Pulmonary Rehabilitation.

## 2021-12-02 ENCOUNTER — Encounter: Payer: BC Managed Care – PPO | Admitting: *Deleted

## 2021-12-02 DIAGNOSIS — I5022 Chronic systolic (congestive) heart failure: Secondary | ICD-10-CM | POA: Diagnosis not present

## 2021-12-02 DIAGNOSIS — I5042 Chronic combined systolic (congestive) and diastolic (congestive) heart failure: Secondary | ICD-10-CM | POA: Diagnosis not present

## 2021-12-02 DIAGNOSIS — Z95811 Presence of heart assist device: Secondary | ICD-10-CM

## 2021-12-02 NOTE — Progress Notes (Signed)
Daily Session Note  Patient Details  Name: [name] MRN: [id number] Date of Birth: 12-01-1970 Referring Provider:   Flowsheet Row Cardiac Rehab from 11/13/2021 in [facility] Cardiac and Pulmonary Rehab  Referring Provider Lanette Hampshire MD       Encounter Date: 12/02/2021  Check In:  Session Check In - 12/02/21 1117       Check-In   Supervising physician immediately available to respond to emergencies See telemetry face sheet for immediately available ER MD    Location [facility]-Cardiac & Pulmonary Rehab    Staff Present Heath Lark, RN, BSN, CCRP;Joseph [place], RCP,RRT,BSRT;Kelly [place], [place], ACSM CEP, Exercise Physiologist    Virtual Visit No    Medication changes reported     No    Fall or balance concerns reported    No    Warm-up and Cool-down Performed on first and last piece of equipment    Resistance Training Performed Yes    VAD Patient? Yes    PAD/SET Patient? No      VAD patient   Has back up controller? Yes    Has spare charged batteries? Yes    Has battery cables? Yes    Has compatible battery clips? Yes      Pain Assessment   Currently in Pain? No/denies                Social History   Tobacco Use  Smoking Status Never  Smokeless Tobacco Never    Goals Met:  Independence with exercise equipment Exercise tolerated well No report of concerns or symptoms today  Goals Unmet:  Not Applicable  Comments: Pt able to follow exercise prescription today without complaint.  Will continue to monitor for progression.    Dr. Emily Filbert is Medical Director for [place].  Dr. Ottie Glazier is Medical Director for [facility] Pulmonary Rehabilitation.

## 2021-12-03 ENCOUNTER — Ambulatory Visit (HOSPITAL_COMMUNITY)
Admission: RE | Admit: 2021-12-03 | Discharge: 2021-12-03 | Disposition: A | Discharge status: Home / Self Care | Payer: BC Managed Care – PPO | Source: Ambulatory Visit | Attending: Internal Medicine | Admitting: Internal Medicine

## 2021-12-03 ENCOUNTER — Telehealth (HOSPITAL_COMMUNITY): Payer: Self-pay | Admitting: Unknown Physician Specialty

## 2021-12-03 DIAGNOSIS — I5022 Chronic systolic (congestive) heart failure: Secondary | ICD-10-CM | POA: Diagnosis not present

## 2021-12-03 DIAGNOSIS — D649 Anemia, unspecified: Secondary | ICD-10-CM | POA: Insufficient documentation

## 2021-12-03 DIAGNOSIS — Z7901 Long term (current) use of anticoagulants: Secondary | ICD-10-CM | POA: Diagnosis not present

## 2021-12-03 DIAGNOSIS — Z95811 Presence of heart assist device: Secondary | ICD-10-CM | POA: Diagnosis not present

## 2021-12-03 LAB — IRON AND TIBC
Iron: 18 ug/dL — ABNORMAL LOW (ref 28–170)
Saturation Ratios: 5 % — ABNORMAL LOW (ref 10.4–31.8)
TIBC: 351 ug/dL (ref 250–450)
UIBC: 333 ug/dL

## 2021-12-03 LAB — CBC
HCT: 27.2 % — ABNORMAL LOW (ref 36.0–46.0)
Hemoglobin: 8.2 g/dL — ABNORMAL LOW (ref 12.0–15.0)
MCH: 24.9 pg — ABNORMAL LOW (ref 26.0–34.0)
MCHC: 30.1 g/dL (ref 30.0–36.0)
MCV: 82.7 fL (ref 80.0–100.0)
Platelets: 379 10*3/uL (ref 150–400)
RBC: 3.29 MIL/uL — ABNORMAL LOW (ref 3.87–5.11)
RDW: 17.6 % — ABNORMAL HIGH (ref 11.5–15.5)
WBC: 13 10*3/uL — ABNORMAL HIGH (ref 4.0–10.5)
nRBC: 0 % (ref 0.0–0.2)

## 2021-12-03 LAB — FOLATE: Folate: 6.8 ng/mL (ref >5.9)

## 2021-12-03 LAB — TYPE AND SCREEN
ABO/RH(D): B POS
Antibody Screen: NEGATIVE

## 2021-12-03 LAB — FERRITIN: Ferritin: 17 ng/mL (ref 11–307)

## 2021-12-03 LAB — VITAMIN B12: Vitamin B-12: 334 pg/mL (ref 180–914)

## 2021-12-03 LAB — DIGOXIN LEVEL: Digoxin Level: 1.3 ng/mL (ref 0.8–2.0)

## 2021-12-03 NOTE — Addendum Note (Signed)
Addended by: Tanda Rockers B on: 12/03/2021 04:49 PM   Modules accepted: Orders

## 2021-12-03 NOTE — Telephone Encounter (Addendum)
Called pt at home to discuss recent lab work. Pt informed that hgb is about the same a as last visit. Pt states that she is no longer having vaginal bleeding. Pt informed that based on her anemia panel she will need 3 doses of IV Fereheme. First dose is scheduled for 6/22 at 1000am. Pt informed that if she has anymore frank vaginal bleeding in the next couple weeks to let our office know as she may require transfusion.    Pts Digoxin level resulted at 1.3 today. Pt instructed to STOP Digoxin per DR Bensimhon. This medication has been removed from her med list. Pt verbalized understanding of all instructions.  Tanda Rockers RN, BSN [facility] Coordinator 24/7 Pager [phone]

## 2021-12-03 NOTE — Addendum Note (Signed)
Encounter addended by: Christinia Gully, RN on: 12/03/2021 1:27 PM  Actions taken: Alternative orders not taken and original order placed, Pharmacy for encounter modified, Order list changed, Diagnosis association updated

## 2021-12-04 ENCOUNTER — Encounter: Payer: BC Managed Care – PPO | Admitting: *Deleted

## 2021-12-04 ENCOUNTER — Encounter: Payer: Self-pay | Admitting: *Deleted

## 2021-12-04 DIAGNOSIS — I5042 Chronic combined systolic (congestive) and diastolic (congestive) heart failure: Secondary | ICD-10-CM

## 2021-12-04 DIAGNOSIS — Z95811 Presence of heart assist device: Secondary | ICD-10-CM

## 2021-12-04 DIAGNOSIS — I5022 Chronic systolic (congestive) heart failure: Secondary | ICD-10-CM | POA: Diagnosis not present

## 2021-12-04 NOTE — Progress Notes (Signed)
Cardiac Individual Treatment Plan  Patient Details  Name: [name] MRN: [id number] Date of Birth: 15-Jun-1971 Referring Provider:   Flowsheet Row Cardiac Rehab from 11/13/2021 in [facility] Cardiac and Pulmonary Rehab  Referring Provider Lanette Hampshire MD       Initial Encounter Date:  Flowsheet Row Cardiac Rehab from 11/13/2021 in [facility] Cardiac and Pulmonary Rehab  Date 11/13/21       Visit Diagnosis: Chronic combined systolic and diastolic heart failure ([place])  LVAD (left ventricular assist device) present [facility])  Patient's Home Medications on Admission:  Current Outpatient Medications:    aspirin EC 81 MG tablet, Take 1 tablet (81 mg total) by mouth daily. Swallow whole., Disp: 90 tablet, Rfl: 3   enoxaparin (LOVENOX) 40 MG/0.4ML injection, Inject 0.4 mLs (40 mg total) into the skin every 12 (twelve) hours. (Patient not taking: Reported on 11/27/2021), Disp: 24 mL, Rfl: 0   methimazole (TAPAZOLE) 10 MG tablet, Take 1 tablet (10 mg total) by mouth 3 (three) times daily., Disp: 270 tablet, Rfl: 1   pantoprazole (PROTONIX) 40 MG tablet, Take 1 tablet (40 mg total) by mouth daily. (Patient not taking: Reported on 11/27/2021), Disp: 30 tablet, Rfl: 0   potassium chloride SA (KLOR-CON M) 20 MEQ tablet, Take 3 tablets (60 mEq total) by mouth 2 (two) times daily., Disp: 180 tablet, Rfl: 0   predniSONE (DELTASONE) 10 MG tablet, Take 1.5 tablets (15 mg total) by mouth daily with breakfast., Disp: 135 tablet, Rfl: 1   quiniDINE gluconate 324 MG CR tablet, Take 1 tablet (324 mg total) by mouth 2 (two) times daily., Disp: 60 tablet, Rfl: 0   rosuvastatin (CRESTOR) 40 MG tablet, Take 1 tablet (40 mg total) by mouth at bedtime., Disp: 30 tablet, Rfl: 0   sacubitril-valsartan (ENTRESTO) 24-26 MG, Take 1 tablet by mouth 2 (two) times daily. (Patient not taking: Reported on 11/27/2021), Disp: 60 tablet, Rfl: 6   sildenafil (REVATIO) 20 MG tablet, Take 1 tablet (20 mg total) by mouth 3 (three) times daily.,  Disp: 90 tablet, Rfl: 6   spironolactone (ALDACTONE) 25 MG tablet, Take 0.5 tablets (12.5 mg total) by mouth daily., Disp: 45 tablet, Rfl: 3   torsemide (DEMADEX) 20 MG tablet, Take 40 mg by mouth daily. (Patient not taking: Reported on 11/27/2021), Disp: , Rfl:    warfarin (COUMADIN) 1 MG tablet, Take 2 mg (2 tab) every day or as directed by HF Clinic (Patient taking differently: Take 2 mg every day except 4 mg on MWF, or as directed by HF Clinic), Disp: 120 tablet, Rfl: 11   zinc gluconate 50 MG tablet, Take 1 tablet (50 mg total) by mouth daily., Disp: 30 tablet, Rfl: 6  Past Medical History: Past Medical History:  Diagnosis Date   Automatic implantable cardioverter-defibrillator in situ    Chronic CHF (congestive heart failure) (HCC)    a. EF 15-20% b. 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   HST 12/2013:  AHI 4/hr (numerous episodes of airflow reduction that did not have concomitant desaturation)     Tobacco Use: Social History   Tobacco Use  Smoking Status Never  Smokeless Tobacco Never    Labs: Review Flowsheet  More data exists      Latest Ref Rng & Units 09/16/2021 09/17/2021 09/18/2021 09/19/2021  Labs for ITP Cardiac and Pulmonary Rehab  O2 Saturation % 72.5  70.5  95.4  63.7  67.5       09/20/2021  Labs for ITP Cardiac and Pulmonary Rehab  O2 Saturation 66.6      Exercise Target Goals: Exercise Program Goal: Individual exercise prescription set using results from initial 6 min walk  test and THRR while considering  patient's activity barriers and safety.   Exercise Prescription Goal: Initial exercise prescription builds to 30-45 minutes a day of aerobic activity, 2-3 days per week.  Home exercise guidelines will be given to patient during program as part of exercise prescription that the participant will acknowledge.   Education: Aerobic Exercise: - Group verbal and visual presentation on the components of exercise prescription. Introduces F.I.T.T principle from ACSM for exercise prescriptions.  Reviews F.I.T.T. principles of aerobic exercise including progression. Written material given at graduation. Flowsheet Row Cardiac Rehab from 10/17/2020 in [facility] Cardiac and Pulmonary Rehab  Date 08/08/20  Educator [facility]  Instruction Review Code 1- Verbalizes Understanding       Education: Resistance Exercise: - Group verbal and visual presentation on the components of exercise prescription. Introduces F.I.T.T principle from ACSM for exercise prescriptions  Reviews F.I.T.T. principles of resistance exercise including progression. Written material given at graduation. Flowsheet Row Cardiac Rehab from 10/17/2020 in [facility] Cardiac and Pulmonary Rehab  Date 10/17/20  Educator [facility]  Instruction Review Code 1- Verbalizes Understanding        Education: Exercise & Equipment Safety: - Individual verbal instruction and demonstration of equipment use and safety with use of the equipment. Flowsheet Row Cardiac Rehab from 11/13/2021 in [facility] Cardiac and Pulmonary Rehab  Education need identified 11/13/21  Date 11/13/21  Educator Ama  Instruction Review Code 1- Verbalizes Understanding       Education: Exercise Physiology & General Exercise Guidelines: - Group verbal and written instruction with models to review the exercise physiology of the cardiovascular system and associated critical values. Provides general exercise guidelines with specific guidelines to those with heart or lung  disease.  Flowsheet Row Cardiac Rehab from 10/17/2020 in [facility] Cardiac and Pulmonary Rehab  Date 10/03/20  Educator [facility]  Instruction Review Code 1- Verbalizes Understanding       Education: Flexibility, Balance, Mind/Body Relaxation: - Group verbal and visual presentation with interactive activity on the components of exercise prescription. Introduces F.I.T.T principle from ACSM for exercise prescriptions. Reviews F.I.T.T. principles of flexibility and balance exercise training including progression. Also discusses the mind body connection.  Reviews various relaxation techniques to help reduce and manage stress (i.e. Deep breathing, progressive muscle relaxation, and visualization). Balance handout provided to take home. Written material given at graduation. Flowsheet Row Cardiac Rehab from 10/17/2020 in [facility] Cardiac and Pulmonary Rehab  Date 08/22/20  Educator AS  Instruction Review Code 1- Verbalizes Understanding       Activity Barriers & Risk Stratification:  Activity Barriers & Cardiac Risk Stratification - 11/13/21 1234       Activity Barriers & Cardiac Risk Stratification   Activity Barriers Deconditioning;Muscular Weakness;Shortness of Breath;Decreased Ventricular Function;Other (comment)  Comments LVAD    Cardiac Risk Stratification High             6 Minute Walk:  6 Minute Walk     Row Name 11/13/21 1245         6 Minute Walk   Phase Initial     Distance 985 feet     Walk Time 6 minutes     # of Rest Breaks 0     MPH 1.86     METS 2.68     RPE 10     Perceived Dyspnea  1     VO2 Peak 9.41     Symptoms Yes (comment)     Comments Slight SOB, unsteadiness with balance     Resting HR 81 bpm     Resting BP 103/64  automated BP cuff     Resting Oxygen Saturation  97 %     Exercise Oxygen Saturation  during 6 min walk 96 %     Max Ex. HR 105 bpm     Max Ex. BP 112/83  automated BP cuff     2 Minute Post BP 85/74  automated BP cuff; On entresto and denied  dizziness/lightheadedness              Oxygen Initial Assessment:   Oxygen Re-Evaluation:   Oxygen Discharge (Final Oxygen Re-Evaluation):   Initial Exercise Prescription:  Initial Exercise Prescription - 11/13/21 1200       Date of Initial Exercise RX and Referring Provider   Date 11/13/21    Referring Provider Lanette Hampshire MD      Oxygen   Maintain Oxygen Saturation 88% or higher      Recumbant Bike   Level 1    RPM 60    Watts 22    Minutes 15    METs 2.6      NuStep   Level 1    SPM 80    Minutes 15    METs 2.6      REL-XR   Level 1    Speed 50    Minutes 15    METs 2.6      Track   Laps 21    Minutes 15    METs 2.14      Prescription Details   Frequency (times per week) 3    Duration Progress to 30 minutes of continuous aerobic without signs/symptoms of physical distress      Intensity   THRR 40-80% of Max Heartrate 116- 152    Ratings of Perceived Exertion 11-13    Perceived Dyspnea 0-4      Progression   Progression Continue to progress workloads to maintain intensity without signs/symptoms of physical distress.      Resistance Training   Training Prescription Yes    Weight 3 lb    Reps 10-15             Perform Capillary Blood Glucose checks as needed.  Exercise Prescription Changes:   Exercise Prescription Changes     Row Name 11/13/21 1200 11/20/21 1400 12/02/21 1600         Response to Exercise   Blood Pressure (Admit) 103/64  automated BP cuff 93/81 91/56     Blood Pressure (Exercise) 112/83  automated BP cuff --  76 Doppler 107/39     Blood Pressure (Exit) 85/74  automated BP cuff; On entresto and denied dizziness/lightheadedness --  72 Doppler 116/83     Heart Rate (Admit)  71 bpm 91 bpm 103 bpm     Heart Rate (Exercise) 105 bpm 116 bpm 113 bpm     Heart Rate (Exit) 80 bpm 98 bpm 90 bpm     Oxygen Saturation (Admit) 97 % -- --     Oxygen Saturation (Exercise) 96 % -- --     Oxygen Saturation (Exit) 97 % -- --      Rating of Perceived Exertion (Exercise) '10 13 13     '$ Perceived Dyspnea (Exercise) 1 -- --     Symptoms Slight SOB, unsteady gait -- SOB     Comments walk test results Today was her first day at Rehab --     Duration -- -- Continue with 30 min of aerobic exercise without signs/symptoms of physical distress.     Intensity -- -- THRR unchanged       Progression   Progression -- -- Continue to progress workloads to maintain intensity without signs/symptoms of physical distress.     Average METs -- -- 2.07       Resistance Training   Training Prescription -- -- Yes     Weight -- -- 3 lb     Reps -- -- 10-15       Interval Training   Interval Training -- -- No       Recumbant Bike   Level -- -- 1.5     Watts -- -- 16     Minutes -- -- 15     METs -- -- 2.45       NuStep   Level -- -- 1     Minutes -- -- 15     METs -- -- 2.5       REL-XR   Level -- -- 1     Minutes -- -- 15     METs -- -- 1.4       T5 Nustep   Level -- -- 3     Minutes -- -- 15     METs -- -- 1.9       Track   Laps -- -- 20     Minutes -- -- 15     METs -- -- 2.09       Oxygen   Maintain Oxygen Saturation -- -- 88% or higher              Exercise Comments:   Exercise Comments     Row Name 11/20/21 1002           Exercise Comments First full day of exercise!  Patient was oriented to gym and equipment including functions, settings, policies, and procedures.  Patient's individual exercise prescription and treatment plan were reviewed.  All starting workloads were established based on the results of the 6 minute walk test done at initial orientation visit.  The plan for exercise progression was also introduced and progression will be customized based on patient's performance and goals.                Exercise Goals and Review:   Exercise Goals     Row Name 11/13/21 1258             Exercise Goals   Increase Physical Activity Yes       Intervention Provide advice,  education, support and counseling about physical activity/exercise needs.;Develop an individualized exercise prescription for aerobic and resistive training based on initial evaluation findings, risk stratification, comorbidities and participant's personal goals.  Expected Outcomes Short Term: Attend rehab on a regular basis to increase amount of physical activity.;Long Term: Add in home exercise to make exercise part of routine and to increase amount of physical activity.;Long Term: Exercising regularly at least 3-5 days a week.       Increase Strength and Stamina Yes       Intervention Provide advice, education, support and counseling about physical activity/exercise needs.;Develop an individualized exercise prescription for aerobic and resistive training based on initial evaluation findings, risk stratification, comorbidities and participant's personal goals.       Expected Outcomes Short Term: Increase workloads from initial exercise prescription for resistance, speed, and METs.;Short Term: Perform resistance training exercises routinely during rehab and add in resistance training at home;Long Term: Improve cardiorespiratory fitness, muscular endurance and strength as measured by increased METs and functional capacity (6MWT)       Able to understand and use rate of perceived exertion (RPE) scale Yes       Intervention Provide education and explanation on how to use RPE scale       Expected Outcomes Short Term: Able to use RPE daily in rehab to express subjective intensity level;Long Term:  Able to use RPE to guide intensity level when exercising independently       Able to understand and use Dyspnea scale Yes       Intervention Provide education and explanation on how to use Dyspnea scale       Expected Outcomes Short Term: Able to use Dyspnea scale daily in rehab to express subjective sense of shortness of breath during exertion;Long Term: Able to use Dyspnea scale to guide intensity level when  exercising independently       Knowledge and understanding of Target Heart Rate Range (THRR) Yes       Intervention Provide education and explanation of THRR including how the numbers were predicted and where they are located for reference       Expected Outcomes Short Term: Able to state/look up THRR;Short Term: Able to use daily as guideline for intensity in rehab;Long Term: Able to use THRR to govern intensity when exercising independently       Able to check pulse independently Yes       Intervention Provide education and demonstration on how to check pulse in carotid and radial arteries.;Review the importance of being able to check your own pulse for safety during independent exercise       Expected Outcomes Short Term: Able to explain why pulse checking is important during independent exercise;Long Term: Able to check pulse independently and accurately       Understanding of Exercise Prescription Yes       Intervention Provide education, explanation, and written materials on patient's individual exercise prescription       Expected Outcomes Short Term: Able to explain program exercise prescription;Long Term: Able to explain home exercise prescription to exercise independently                Exercise Goals Re-Evaluation :  Exercise Goals Re-Evaluation     [place] Name 11/20/21 1002 11/20/21 1409 12/02/21 1600         Exercise Goal Re-Evaluation   Exercise Goals Review Increase Physical Activity;Able to understand and use rate of perceived exertion (RPE) scale;Knowledge and understanding of Target Heart Rate Range (THRR);Understanding of Exercise Prescription;Increase Strength and Stamina;Able to understand and use Dyspnea scale;Able to check pulse independently Increase Physical Activity;Understanding of Exercise Prescription;Increase Strength and Stamina Increase Physical Activity;Understanding of  Exercise Prescription;Increase Strength and Stamina     Comments Reviewed RPE and dyspnea  scales, THR and program prescription with pt today.  Pt voiced understanding and was given a copy of goals to take home. Zorah did well on her first day in the program. Pt will benefit from continuing to attend rehab. Sameen is off to a great start in rehab.  Some days we have been able to use automatic cuff and some days need to use dopplar for blood pressures.  She has only attended three full sessions as she had to miss a few appointments due to an alarm on her VAD but was found to be to dry and meds were adjusted.  She is up to level 1.5 on the bike and level 3 on T5 NuStep. We will conitnue to monitor her progress.     Expected Outcomes Short: Use RPE daily to regulate intensity. Long: Follow program prescription in THR. Short: Use RPE daily to regulate intensity. Long: Follow program prescription in THR. Short: Continue ot attend rehab regularly Long: Continue to follow program prescription              Discharge Exercise Prescription (Final Exercise Prescription Changes):  Exercise Prescription Changes - 12/02/21 1600       Response to Exercise   Blood Pressure (Admit) 91/56    Blood Pressure (Exercise) 107/39    Blood Pressure (Exit) 116/83    Heart Rate (Admit) 103 bpm    Heart Rate (Exercise) 113 bpm    Heart Rate (Exit) 90 bpm    Rating of Perceived Exertion (Exercise) 13    Symptoms SOB    Duration Continue with 30 min of aerobic exercise without signs/symptoms of physical distress.    Intensity THRR unchanged      Progression   Progression Continue to progress workloads to maintain intensity without signs/symptoms of physical distress.    Average METs 2.07      Resistance Training   Training Prescription Yes    Weight 3 lb    Reps 10-15      Interval Training   Interval Training No      Recumbant Bike   Level 1.5    Watts 16    Minutes 15    METs 2.45      NuStep   Level 1    Minutes 15    METs 2.5      REL-XR   Level 1    Minutes 15    METs 1.4       T5 Nustep   Level 3    Minutes 15    METs 1.9      Track   Laps 20    Minutes 15    METs 2.09      Oxygen   Maintain Oxygen Saturation 88% or higher             Nutrition:  Target Goals: Understanding of nutrition guidelines, daily intake of sodium '1500mg'$ , cholesterol '200mg'$ , calories 30% from fat and 7% or less from saturated fats, daily to have 5 or more servings of fruits and vegetables.  Education: All About Nutrition: -Group instruction provided by verbal, written material, interactive activities, discussions, models, and posters to present general guidelines for heart healthy nutrition including fat, fiber, MyPlate, the role of sodium in heart healthy nutrition, utilization of the nutrition label, and utilization of this knowledge for meal planning. Follow up email sent as well. Written material given at graduation. Flowsheet Row Cardiac  Rehab from 11/13/2021 in [facility] Cardiac and Pulmonary Rehab  Education need identified 11/13/21       Biometrics:  Pre Biometrics - 11/13/21 1234       Pre Biometrics   Height '5\' 4"'$  (1.626 m)    Weight 230 lb 12.8 oz (104.7 kg)   Weighed with LVAD   BMI (Calculated) 39.6    Single Leg Stand 30 seconds              Nutrition Therapy Plan and Nutrition Goals:  Nutrition Therapy & Goals - 11/13/21 1227       Personal Nutrition Goals   Comments Patient has limited diet as she takes coumadin.      Intervention Plan   Intervention Prescribe, educate and counsel regarding individualized specific dietary modifications aiming towards targeted core components such as weight, hypertension, lipid management, diabetes, heart failure and other comorbidities.    Expected Outcomes Short Term Goal: Understand basic principles of dietary content, such as calories, fat, sodium, cholesterol and nutrients.;Short Term Goal: A plan has been developed with personal nutrition goals set during dietitian appointment.;Long Term Goal: Adherence to  prescribed nutrition plan.             Nutrition Assessments:  MEDIFICTS Score Key: ?70 Need to make dietary changes  40-70 Heart Healthy Diet ? 40 Therapeutic Level Cholesterol Diet  Flowsheet Row Cardiac Rehab from 11/13/2021 in [facility] Cardiac and Pulmonary Rehab  Picture Your Plate Total Score on Admission 70      Picture Your Plate Scores: <28 Unhealthy dietary pattern with much room for improvement. 41-50 Dietary pattern unlikely to meet recommendations for good health and room for improvement. 51-60 More healthful dietary pattern, with some room for improvement.  >60 Healthy dietary pattern, although there may be some specific behaviors that could be improved.    Nutrition Goals Re-Evaluation:   Nutrition Goals Discharge (Final Nutrition Goals Re-Evaluation):   Psychosocial: Target Goals: Acknowledge presence or absence of significant depression and/or stress, maximize coping skills, provide positive support system. Participant is able to verbalize types and ability to use techniques and skills needed for reducing stress and depression.   Education: Stress, Anxiety, and Depression - Group verbal and visual presentation to define topics covered.  Reviews how body is impacted by stress, anxiety, and depression.  Also discusses healthy ways to reduce stress and to treat/manage anxiety and depression.  Written material given at graduation. Flowsheet Row Cardiac Rehab from 10/17/2020 in [facility] Cardiac and Pulmonary Rehab  Education need identified 08/06/20  Date 09/26/20  Educator [facility]  Instruction Review Code 1- [employer] Understanding       Education: Sleep Hygiene -Provides group verbal and written instruction about how sleep can affect your health.  Define sleep hygiene, discuss sleep cycles and impact of sleep habits. Review good sleep hygiene tips.    Initial Review & Psychosocial Screening:  Initial Psych Review & Screening - 11/01/21 1410       Initial Review    Current issues with None Identified      Family Dynamics   Good Support System? Yes   husband and 31 and 23 years children     Barriers   Psychosocial barriers to participate in program There are no identifiable barriers or psychosocial needs.      Screening Interventions   Interventions Encouraged to exercise    Expected Outcomes Short Term goal: Utilizing psychosocial counselor, staff and physician to assist with identification of specific Stressors or current issues interfering with  healing process. Setting desired goal for each stressor or current issue identified.;Long Term Goal: Stressors or current issues are controlled or eliminated.;Short Term goal: Identification and review with participant of any Quality of Life or Depression concerns found by scoring the questionnaire.;Long Term goal: The participant improves quality of Life and PHQ9 Scores as seen by post scores and/or verbalization of changes             Quality of Life Scores:   Quality of Life - 11/13/21 1230       Quality of Life   Select Quality of Life      Quality of Life Scores   Health/Function Pre 27.57 %    Socioeconomic Pre 29.64 %    Psych/Spiritual Pre 30 %    Family Pre 30 %    GLOBAL Pre 28.85 %            Scores of 19 and below usually indicate a poorer quality of life in these areas.  A difference of  2-3 points is a clinically meaningful difference.  A difference of 2-3 points in the total score of the Quality of Life Index has been associated with significant improvement in overall quality of life, self-image, physical symptoms, and general health in studies assessing change in quality of life.  PHQ-9: Review Flowsheet  More data exists      11/13/2021 09/03/2020 08/06/2020 10/05/2017 11/27/2014  Depression screen PHQ 2/9  Decreased Interest 0 0 0 1 0  Down, Depressed, Hopeless 0 0 0 0 0  PHQ - 2 Score 0 0 0 1 0  Altered sleeping 0 '1 1 1 '$ -  Tired, decreased energy '1 1 2 2 '$ -  Change in  appetite 1 0 1 - -  Feeling bad or failure about yourself  0 0 0 1 -  Trouble concentrating 0 0 0 0 -  Moving slowly or fidgety/restless 0 0 1 0 -  Suicidal thoughts 0 0 0 0 -  PHQ-9 Score '2 2 5 5 '$ -  Difficult doing work/chores Not difficult at all Not difficult at all Not difficult at all Somewhat difficult -   Interpretation of Total Score  Total Score Depression Severity:  1-4 = Minimal depression, 5-9 = Mild depression, 10-14 = Moderate depression, 15-19 = Moderately severe depression, 20-27 = Severe depression   Psychosocial Evaluation and Intervention:  Psychosocial Evaluation - 11/01/21 1419       Psychosocial Evaluation & Interventions   Interventions Encouraged to exercise with the program and follow exercise prescription    Comments Adryanna is returning to the program after LVAD insertion. She has no barriers to attending. She has become deconditioned after a long period in the hospital and recovering. She has lost weight and wants to lose more weight. She is trying to increase her protein intake and wants to talk with Melissa,our RD.  Meliss is ready to get started.    Expected Outcomes STG Shannette ia able to attend all sechedules sessions. She is able to work with our RD and find ways to increase her protein intake without gaining weight if possible. LTG Christabella is able to continue to progress with er exercise and weight loss goals    Continue Psychosocial Services  Follow up required by staff             Psychosocial Re-Evaluation:   Psychosocial Discharge (Final Psychosocial Re-Evaluation):   Vocational Rehabilitation: Provide vocational rehab assistance to qualifying candidates.   Vocational Rehab Evaluation & Intervention:  Education: Education Goals: Education classes will be provided on a variety of topics geared toward better understanding of heart health and risk factor modification. Participant will state understanding/return demonstration of topics presented  as noted by education test scores.  Learning Barriers/Preferences:   General Cardiac Education Topics:  AED/CPR: - Group verbal and written instruction with the use of models to demonstrate the basic use of the AED with the basic ABC's of resuscitation.   Anatomy and Cardiac Procedures: - Group verbal and visual presentation and models provide information about basic cardiac anatomy and function. Reviews the testing methods done to diagnose heart disease and the outcomes of the test results. Describes the treatment choices: Medical Management, Angioplasty, or Coronary Bypass Surgery for treating various heart conditions including Myocardial Infarction, Angina, Valve Disease, and Cardiac Arrhythmias.  Written material given at graduation. Flowsheet Row Cardiac Rehab from 11/13/2021 in [facility] Cardiac and Pulmonary Rehab  Education need identified 11/13/21       Medication Safety: - Group verbal and visual instruction to review commonly prescribed medications for heart and lung disease. Reviews the medication, class of the drug, and side effects. Includes the steps to properly store meds and maintain the prescription regimen.  Written material given at graduation. Flowsheet Row Cardiac Rehab from 10/17/2020 in [facility] Cardiac and Pulmonary Rehab  Date 09/05/20  Educator SB  Instruction Review Code 1- Verbalizes Understanding       Intimacy: - Group verbal instruction through game format to discuss how heart and lung disease can affect sexual intimacy. Written material given at graduation.. Flowsheet Row Cardiac Rehab from 10/17/2020 in [facility] Cardiac and Pulmonary Rehab  Date 08/08/20  Educator [facility]  Instruction Review Code 1- Verbalizes Understanding       Know Your Numbers and Heart Failure: - Group verbal and visual instruction to discuss disease risk factors for cardiac and pulmonary disease and treatment options.  Reviews associated critical values for Overweight/Obesity, Hypertension,  Cholesterol, and Diabetes.  Discusses basics of heart failure: signs/symptoms and treatments.  Introduces Heart Failure Zone chart for action plan for heart failure.  Written material given at graduation. Flowsheet Row Cardiac Rehab from 11/13/2021 in [facility] Cardiac and Pulmonary Rehab  Education need identified 11/13/21       Infection Prevention: - Provides verbal and written material to individual with discussion of infection control including proper hand washing and proper equipment cleaning during exercise session. Flowsheet Row Cardiac Rehab from 11/13/2021 in [facility] Cardiac and Pulmonary Rehab  Education need identified 11/13/21  Date 11/13/21  Educator [place]  Instruction Review Code 1- Verbalizes Understanding       Falls Prevention: - Provides verbal and written material to individual with discussion of falls prevention and safety. Flowsheet Row Cardiac Rehab from 11/13/2021 in [facility] Cardiac and Pulmonary Rehab  Education need identified 11/13/21  Date 11/13/21  Educator [place]  Instruction Review Code 1- Verbalizes Understanding       Other: -Provides group and verbal instruction on various topics (see comments)   Knowledge Questionnaire Score:  Knowledge Questionnaire Score - 11/13/21 1229       Knowledge Questionnaire Score   Pre Score 23/26             Core Components/Risk Factors/Patient Goals at Admission:  Personal Goals and Risk Factors at Admission - 11/13/21 1258       Core Components/Risk Factors/Patient Goals on Admission    Weight Management Yes;Weight Loss;Obesity    Intervention Weight Management: Develop a combined nutrition and exercise program designed  to reach desired caloric intake, while maintaining appropriate intake of nutrient and fiber, sodium and fats, and appropriate energy expenditure required for the weight goal.;Weight Management/Obesity: Establish reasonable short term and long term weight goals.    Admit Weight 230 lb 12.8 oz (104.7 kg)    weighed with LVAD & bag   Goal Weight: Short Term 225 lb (102.1 kg)    Goal Weight: Long Term 200 lb (90.7 kg)    Expected Outcomes Short Term: Continue to assess and modify interventions until short term weight is achieved;Long Term: Adherence to nutrition and physical activity/exercise program aimed toward attainment of established weight goal;Weight Loss: Understanding of general recommendations for a balanced deficit meal plan, which promotes 1-2 lb weight loss per week and includes a negative energy balance of [phone] kcal/d;Understanding recommendations for meals to include 15-35% energy as protein, 25-35% energy from fat, 35-60% energy from carbohydrates, less than '200mg'$  of dietary cholesterol, 20-35 gm of total fiber daily;Understanding of distribution of calorie intake throughout the day with the consumption of 4-5 meals/snacks    Heart Failure Yes    Intervention Provide a combined exercise and nutrition program that is supplemented with education, support and counseling about heart failure. Directed toward relieving symptoms such as shortness of breath, decreased exercise tolerance, and extremity edema.    Expected Outcomes Improve functional capacity of life;Short term: Attendance in program 2-3 days a week with increased exercise capacity. Reported lower sodium intake. Reported increased fruit and vegetable intake. Reports medication compliance.;Short term: Daily weights obtained and reported for increase. Utilizing diuretic protocols set by physician.;Long term: Adoption of self-care skills and reduction of barriers for early signs and symptoms recognition and intervention leading to self-care maintenance.    Hypertension Yes    Intervention Provide education on lifestyle modifcations including regular physical activity/exercise, weight management, moderate sodium restriction and increased consumption of fresh fruit, vegetables, and low fat dairy, alcohol moderation, and smoking  cessation.;Monitor prescription use compliance.    Expected Outcomes Short Term: Continued assessment and intervention until BP is < 140/23m HG in hypertensive participants. < 130/848mHG in hypertensive participants with diabetes, heart failure or chronic kidney disease.;Long Term: Maintenance of blood pressure at goal levels.    Lipids Yes    Intervention Provide education and support for participant on nutrition & aerobic/resistive exercise along with prescribed medications to achieve LDL '70mg'$ , HDL >'40mg'$ .    Expected Outcomes Short Term: Participant states understanding of desired cholesterol values and is compliant with medications prescribed. Participant is following exercise prescription and nutrition guidelines.;Long Term: Cholesterol controlled with medications as prescribed, with individualized exercise RX and with personalized nutrition plan. Value goals: LDL < '70mg'$ , HDL > 40 mg.             Education:Diabetes - Individual verbal and written instruction to review signs/symptoms of diabetes, desired ranges of glucose level fasting, after meals and with exercise. Acknowledge that pre and post exercise glucose checks will be done for 3 sessions at entry of program.   Core Components/Risk Factors/Patient Goals Review:    Core Components/Risk Factors/Patient Goals at Discharge (Final Review):    ITP Comments:  ITP Comments     Row Name 11/01/21 1424 11/13/21 1223 11/20/21 1002 12/04/21 0731     ITP Comments Virtual orientation call completed today. shehas an appointment on Date: 11/13/2021  for EP eval and gym Orientation.  [facility]ation of diagnosis [facility] be found [facility]: 08/25/2021 . Completed 6MWT and gym orientation. Initial ITP created and sent  for review to Dr. Emily Filbert, Medical Director. First full day of exercise!  Patient was oriented to gym and equipment including functions, settings, policies, and procedures.  Patient's individual exercise prescription and treatment  plan were reviewed.  All starting workloads were established based on the results of the 6 minute walk test done at initial orientation visit.  The plan for exercise progression was also introduced and progression will be customized based on patient's performance and goals. 30 Day review completed. Medical Director ITP review done, changes made as directed, and signed approval by Medical Director.             Comments:

## 2021-12-04 NOTE — Progress Notes (Signed)
Daily Session Note  Patient Details  Name: [name] MRN: [id number] Date of Birth: 11/07/1970 Referring Provider:   Flowsheet Row Cardiac Rehab from 11/13/2021 in [facility] Cardiac and Pulmonary Rehab  Referring Provider Lanette Hampshire MD       Encounter Date: 12/04/2021  Check In:  Session Check In - 12/04/21 1040       Check-In   Supervising physician immediately available to respond to emergencies See telemetry face sheet for immediately available ER MD    Location [facility]-Cardiac & Pulmonary Rehab    Staff Present Heath Lark, RN, BSN, CCRP;Laureen Owens Shark, BS, RRT, CPFT;Jessica [place]fax, MA, RCEP, CCRP, CCET    Virtual Visit No    Medication changes reported     No    Fall or balance concerns reported    No    Warm-up and Cool-down Performed on first and last piece of equipment    Resistance Training Performed Yes    VAD Patient? Yes    PAD/SET Patient? No      VAD patient   Has back up controller? Yes    Has spare charged batteries? Yes    Has battery cables? Yes    Has compatible battery clips? Yes      Pain Assessment   Currently in Pain? No/denies                Social History   Tobacco Use  Smoking Status Never  Smokeless Tobacco Never    Goals Met:  Independence with exercise equipment Exercise tolerated well No report of concerns or symptoms today  Goals Unmet:  Not Applicable  Comments: Pt able to follow exercise prescription today without complaint.  Will continue to monitor for progression.    Dr. Emily Filbert is Medical Director for [place].  Dr. Ottie Glazier is Medical Director for [facility] Pulmonary Rehabilitation.

## 2021-12-05 ENCOUNTER — Ambulatory Visit (HOSPITAL_COMMUNITY): Payer: Self-pay | Admitting: Pharmacist

## 2021-12-05 DIAGNOSIS — R1900 Intra-abdominal and pelvic swelling, mass and lump, unspecified site: Secondary | ICD-10-CM | POA: Diagnosis not present

## 2021-12-05 DIAGNOSIS — Z01419 Encounter for gynecological examination (general) (routine) without abnormal findings: Secondary | ICD-10-CM | POA: Diagnosis not present

## 2021-12-05 LAB — POCT INR: INR: 1.9 — AB (ref 2.0–3.0)

## 2021-12-05 MED ORDER — WARFARIN SODIUM 2 MG PO TABS: ORAL_TABLET | ORAL | 5 refills | Status: DC

## 2021-12-05 NOTE — Progress Notes (Signed)
LVAD INR 

## 2021-12-06 ENCOUNTER — Encounter: Payer: BC Managed Care – PPO | Admitting: *Deleted

## 2021-12-06 DIAGNOSIS — I5022 Chronic systolic (congestive) heart failure: Secondary | ICD-10-CM | POA: Diagnosis not present

## 2021-12-06 DIAGNOSIS — Z95811 Presence of heart assist device: Secondary | ICD-10-CM

## 2021-12-06 DIAGNOSIS — I5042 Chronic combined systolic (congestive) and diastolic (congestive) heart failure: Secondary | ICD-10-CM

## 2021-12-06 NOTE — Progress Notes (Signed)
Daily Session Note  Patient Details  Name: [name] MRN: [id number] Date of Birth: 01/27/1971 Referring Provider:   Flowsheet Row Cardiac Rehab from 11/13/2021 in [facility] Cardiac and Pulmonary Rehab  Referring Provider Lanette Hampshire MD       Encounter Date: 12/06/2021  Check In:  Session Check In - 12/06/21 1032       Check-In   Supervising physician immediately available to respond to emergencies See telemetry face sheet for immediately available ER MD    Location [facility]-Cardiac & Pulmonary Rehab    Staff Present Heath Lark, RN, BSN, CCRP;Jessica Coolin, MA, RCEP, CCRP, CCET;Joseph [place], [place]    Virtual Visit No    Medication changes reported     No    Fall or balance concerns reported    No    Warm-up and Cool-down Performed on first and last piece of equipment    Resistance Training Performed Yes    VAD Patient? Yes    PAD/SET Patient? No      VAD patient   Has back up controller? Yes    Has spare charged batteries? Yes    Has battery cables? Yes    Has compatible battery clips? Yes      Pain Assessment   Currently in Pain? No/denies                Social History   Tobacco Use  Smoking Status Never  Smokeless Tobacco Never    Goals Met:  Independence with exercise equipment Exercise tolerated well No report of concerns or symptoms today  Goals Unmet:  Not Applicable  Comments: Pt able to follow exercise prescription today without complaint.  Will continue to monitor for progression.    Dr. Emily Filbert is Medical Director for [place].  Dr. Ottie Glazier is Medical Director for [facility] Pulmonary Rehabilitation.

## 2021-12-09 ENCOUNTER — Encounter: Payer: BC Managed Care – PPO | Admitting: *Deleted

## 2021-12-09 DIAGNOSIS — Z95811 Presence of heart assist device: Secondary | ICD-10-CM

## 2021-12-09 DIAGNOSIS — I5042 Chronic combined systolic (congestive) and diastolic (congestive) heart failure: Secondary | ICD-10-CM

## 2021-12-09 DIAGNOSIS — I5022 Chronic systolic (congestive) heart failure: Secondary | ICD-10-CM | POA: Diagnosis not present

## 2021-12-09 NOTE — Progress Notes (Signed)
Daily Session Note  Patient Details  Name: [name] MRN: [id number] Date of Birth: 1971/04/08 Referring Provider:   Flowsheet Row Cardiac Rehab from 11/13/2021 in [facility] Cardiac and Pulmonary Rehab  Referring Provider Lanette Hampshire MD       Encounter Date: 12/09/2021  Check In:  Session Check In - 12/09/21 1008       Check-In   Supervising physician immediately available to respond to emergencies See telemetry face sheet for immediately available ER MD    Location [facility]-Cardiac & Pulmonary Rehab    Staff Present Heath Lark, RN, BSN, Laveda Norman, BS, ACSM CEP, Exercise Physiologist;Kelly Rosalia Hammers, MPA, RN    Virtual Visit No    Medication changes reported     No    Fall or balance concerns reported    No    Warm-up and Cool-down Performed on first and last piece of equipment    Resistance Training Performed Yes    VAD Patient? Yes    PAD/SET Patient? No      VAD patient   Has back up controller? Yes    Has spare charged batteries? Yes    Has battery cables? Yes    Has compatible battery clips? Yes      Pain Assessment   Currently in Pain? No/denies                Social History   Tobacco Use  Smoking Status Never  Smokeless Tobacco Never    Goals Met:  Independence with exercise equipment Exercise tolerated well No report of concerns or symptoms today  Goals Unmet:  Not Applicable  Comments: Pt able to follow exercise prescription today without complaint.  Will continue to monitor for progression.    Dr. Emily Filbert is Medical Director for [place].  Dr. Ottie Glazier is Medical Director for [facility] Pulmonary Rehabilitation.

## 2021-12-09 NOTE — Progress Notes (Signed)
Completed initial RD consultation ?

## 2021-12-10 ENCOUNTER — Ambulatory Visit (HOSPITAL_BASED_OUTPATIENT_CLINIC_OR_DEPARTMENT_OTHER): Payer: BC Managed Care – PPO | Admitting: Cardiology

## 2021-12-10 NOTE — Telephone Encounter (Signed)
Per [name] patient came in today to pick up her Home Sleep study and she has a LVAD and can not do a in home study it has to be changed to a in lab study.

## 2021-12-11 DIAGNOSIS — I5042 Chronic combined systolic (congestive) and diastolic (congestive) heart failure: Secondary | ICD-10-CM

## 2021-12-11 DIAGNOSIS — Z95811 Presence of heart assist device: Secondary | ICD-10-CM

## 2021-12-11 DIAGNOSIS — I5022 Chronic systolic (congestive) heart failure: Secondary | ICD-10-CM | POA: Diagnosis not present

## 2021-12-11 NOTE — Progress Notes (Signed)
Daily Session Note  Patient Details  Name: [name] MRN: [id number] Date of Birth: August 21, 1970 Referring Provider:   Flowsheet Row Cardiac Rehab from 11/13/2021 in [facility] Cardiac and Pulmonary Rehab  Referring Provider Lanette Hampshire MD       Encounter Date: 12/11/2021  Check In:  Session Check In - 12/11/21 0951       Check-In   Supervising physician immediately available to respond to emergencies See telemetry face sheet for immediately available ER MD    Location [facility]-Cardiac & Pulmonary Rehab    Staff Present Carson Myrtle, BS, RRT, CPFT;Kristen Coble, RN,BC,MSN;Arley Salamone Rosalia Hammers, MPA, RN    Virtual Visit No    Medication changes reported     No    Fall or balance concerns reported    No    Warm-up and Cool-down Performed on first and last piece of equipment    Resistance Training Performed Yes    VAD Patient? Yes    PAD/SET Patient? No      VAD patient   Has back up controller? Yes    Has spare charged batteries? Yes    Has battery cables? Yes    Has compatible battery clips? Yes      Pain Assessment   Currently in Pain? No/denies                Social History   Tobacco Use  Smoking Status Never  Smokeless Tobacco Never    Goals Met:  Independence with exercise equipment Exercise tolerated well No report of concerns or symptoms today Strength training completed today  Goals Unmet:  Not Applicable  Comments: Pt able to follow exercise prescription today without complaint.  Will continue to monitor for progression.    Dr. Emily Filbert is Medical Director for [place].  Dr. Ottie Glazier is Medical Director for [facility] Pulmonary Rehabilitation.

## 2021-12-11 NOTE — Telephone Encounter (Signed)
Prior Authorization for SPLIT NIGHT sent to [facility] via web portal. Tracking Number  .Order ID: [id number] Authorized Approval Valid Through:12/11/2021 - 02/08/2022

## 2021-12-11 NOTE — Addendum Note (Signed)
Addended by: Freada Bergeron on: 12/11/2021 11:10 AM   Modules accepted: Orders

## 2021-12-12 ENCOUNTER — Other Ambulatory Visit (HOSPITAL_COMMUNITY): Payer: Self-pay | Admitting: *Deleted

## 2021-12-12 ENCOUNTER — Encounter (HOSPITAL_COMMUNITY)
Admission: RE | Admit: 2021-12-12 | Discharge: 2021-12-12 | Disposition: A | Discharge status: Home / Self Care | Payer: BC Managed Care – PPO | Source: Ambulatory Visit | Attending: Cardiology | Admitting: Cardiology

## 2021-12-12 ENCOUNTER — Ambulatory Visit (HOSPITAL_COMMUNITY): Payer: Self-pay | Admitting: Pharmacist

## 2021-12-12 DIAGNOSIS — D649 Anemia, unspecified: Secondary | ICD-10-CM | POA: Insufficient documentation

## 2021-12-12 DIAGNOSIS — Z95811 Presence of heart assist device: Secondary | ICD-10-CM

## 2021-12-12 DIAGNOSIS — Z7901 Long term (current) use of anticoagulants: Secondary | ICD-10-CM

## 2021-12-12 LAB — POCT INR: INR: 0.9 — AB (ref 2.0–3.0)

## 2021-12-12 MED ORDER — SODIUM CHLORIDE 0.9 % IV SOLN
510.0000 mg | INTRAVENOUS | Status: DC
  Administered 2021-12-12: 510 mg via INTRAVENOUS
  Filled 2021-12-12: qty 17

## 2021-12-12 NOTE — Progress Notes (Signed)
LVAD INR 

## 2021-12-13 ENCOUNTER — Ambulatory Visit (HOSPITAL_COMMUNITY): Payer: Self-pay | Admitting: Pharmacist

## 2021-12-13 ENCOUNTER — Encounter: Payer: BC Managed Care – PPO | Admitting: *Deleted

## 2021-12-13 ENCOUNTER — Other Ambulatory Visit
Admission: RE | Admit: 2021-12-13 | Discharge: 2021-12-13 | Disposition: A | Discharge status: Home / Self Care | Payer: BC Managed Care – PPO | Attending: Cardiology | Admitting: Cardiology

## 2021-12-13 DIAGNOSIS — Z7901 Long term (current) use of anticoagulants: Secondary | ICD-10-CM | POA: Diagnosis not present

## 2021-12-13 DIAGNOSIS — Z95811 Presence of heart assist device: Secondary | ICD-10-CM | POA: Insufficient documentation

## 2021-12-13 DIAGNOSIS — I5022 Chronic systolic (congestive) heart failure: Secondary | ICD-10-CM | POA: Diagnosis not present

## 2021-12-13 DIAGNOSIS — I5042 Chronic combined systolic (congestive) and diastolic (congestive) heart failure: Secondary | ICD-10-CM | POA: Diagnosis not present

## 2021-12-13 LAB — PROTIME-INR
INR: 1.8 — ABNORMAL HIGH (ref 0.8–1.2)
Prothrombin Time: 20.6 seconds — ABNORMAL HIGH (ref 11.4–15.2)

## 2021-12-16 ENCOUNTER — Encounter: Payer: BC Managed Care – PPO | Admitting: *Deleted

## 2021-12-16 DIAGNOSIS — Z95811 Presence of heart assist device: Secondary | ICD-10-CM

## 2021-12-16 DIAGNOSIS — I5042 Chronic combined systolic (congestive) and diastolic (congestive) heart failure: Secondary | ICD-10-CM

## 2021-12-16 DIAGNOSIS — I5022 Chronic systolic (congestive) heart failure: Secondary | ICD-10-CM | POA: Diagnosis not present

## 2021-12-18 DIAGNOSIS — Z95811 Presence of heart assist device: Secondary | ICD-10-CM

## 2021-12-18 DIAGNOSIS — I5042 Chronic combined systolic (congestive) and diastolic (congestive) heart failure: Secondary | ICD-10-CM

## 2021-12-18 DIAGNOSIS — I5022 Chronic systolic (congestive) heart failure: Secondary | ICD-10-CM | POA: Diagnosis not present

## 2021-12-18 NOTE — Progress Notes (Signed)
Daily Session Note  Patient Details  Name: [name] MRN: [id number] Date of Birth: July 20, 1970 Referring Provider:   Flowsheet Row Cardiac Rehab from 11/13/2021 in [facility] Cardiac and Pulmonary Rehab  Referring Provider Lanette Hampshire MD       Encounter Date: 12/18/2021  Check In:  Session Check In - 12/18/21 1004       Check-In   Supervising physician immediately available to respond to emergencies See telemetry face sheet for immediately available ER MD    Location [facility]-Cardiac & Pulmonary Rehab    Staff Present Carson Myrtle, BS, RRT, CPFT;Teela Narducci Amedeo Plenty, BS, ACSM CEP, Exercise Physiologist;Joseph Tessie Fass, Fabio Neighbors, MPA, RN    Virtual Visit No    Medication changes reported     No    Fall or balance concerns reported    No    Warm-up and Cool-down Performed on first and last piece of equipment    Resistance Training Performed Yes    VAD Patient? Yes    PAD/SET Patient? No      VAD patient   Has back up controller? Yes    Has spare charged batteries? Yes    Has battery cables? Yes    Has compatible battery clips? Yes      Pain Assessment   Currently in Pain? No/denies                Social History   Tobacco Use  Smoking Status Never  Smokeless Tobacco Never    Goals Met:  Independence with exercise equipment Exercise tolerated well No report of concerns or symptoms today Strength training completed today  Goals Unmet:  Not Applicable  Comments: Pt able to follow exercise prescription today without complaint.  Will continue to monitor for progression.    Dr. Emily Filbert is Medical Director for [place].  Dr. Ottie Glazier is Medical Director for [facility] Pulmonary Rehabilitation.

## 2021-12-19 ENCOUNTER — Ambulatory Visit (HOSPITAL_COMMUNITY): Payer: Self-pay | Admitting: Pharmacist

## 2021-12-19 ENCOUNTER — Encounter (HOSPITAL_COMMUNITY)
Admission: RE | Admit: 2021-12-19 | Discharge: 2021-12-19 | Disposition: A | Discharge status: Home / Self Care | Payer: BC Managed Care – PPO | Source: Ambulatory Visit | Attending: Cardiology | Admitting: Cardiology

## 2021-12-19 DIAGNOSIS — D649 Anemia, unspecified: Secondary | ICD-10-CM

## 2021-12-19 LAB — POCT INR: INR: 2.7 (ref 2.0–3.0)

## 2021-12-19 MED ORDER — SODIUM CHLORIDE 0.9 % IV SOLN
510.0000 mg | INTRAVENOUS | Status: DC
  Administered 2021-12-19: 510 mg via INTRAVENOUS
  Filled 2021-12-19: qty 17

## 2021-12-19 NOTE — Progress Notes (Signed)
LVAD INR 

## 2021-12-20 ENCOUNTER — Encounter: Payer: BC Managed Care – PPO | Admitting: *Deleted

## 2021-12-20 DIAGNOSIS — I5042 Chronic combined systolic (congestive) and diastolic (congestive) heart failure: Secondary | ICD-10-CM | POA: Diagnosis not present

## 2021-12-20 DIAGNOSIS — Z95811 Presence of heart assist device: Secondary | ICD-10-CM | POA: Diagnosis not present

## 2021-12-20 DIAGNOSIS — I5022 Chronic systolic (congestive) heart failure: Secondary | ICD-10-CM | POA: Diagnosis not present

## 2021-12-20 NOTE — Progress Notes (Signed)
Daily Session Note  Patient Details  Name: [name] MRN: [id number] Date of Birth: [birth year] Referring Provider:   Flowsheet Row Cardiac Rehab from 11/13/2021 in [facility] Cardiac and Pulmonary Rehab  Referring Provider Lanette Hampshire MD       Encounter Date: 12/20/2021  Check In:  Session Check In - 12/20/21 1021       Check-In   Supervising physician immediately available to respond to emergencies See telemetry face sheet for immediately available ER MD    Location [facility]-Cardiac & Pulmonary Rehab    Staff Present Heath Lark, RN, BSN, CCRP;Jessica [place], MA, RCEP, CCRP, CCET;Joseph [place], [place]    Virtual Visit No    Medication changes reported     No    Fall or balance concerns reported    No    Warm-up and Cool-down Performed on first and last piece of equipment    Resistance Training Performed Yes    VAD Patient? Yes    PAD/SET Patient? No      VAD patient   Has back up controller? Yes    Has spare charged batteries? Yes    Has battery cables? Yes    Has compatible battery clips? Yes      Pain Assessment   Currently in Pain? No/denies                Social History   Tobacco Use  Smoking Status Never  Smokeless Tobacco Never    Goals Met:  Independence with exercise equipment Exercise tolerated well No report of concerns or symptoms today  Goals Unmet:  Not Applicable  Comments: Pt able to follow exercise prescription today without complaint.  Will continue to monitor for progression.    Dr. Emily Filbert is Medical Director for [place].  Dr. Ottie Glazier is Medical Director for [facility] Pulmonary Rehabilitation.

## 2021-12-23 ENCOUNTER — Encounter: Payer: BC Managed Care – PPO | Attending: Cardiology | Admitting: *Deleted

## 2021-12-23 DIAGNOSIS — I5042 Chronic combined systolic (congestive) and diastolic (congestive) heart failure: Secondary | ICD-10-CM | POA: Insufficient documentation

## 2021-12-23 DIAGNOSIS — Z95811 Presence of heart assist device: Secondary | ICD-10-CM | POA: Insufficient documentation

## 2021-12-23 NOTE — Progress Notes (Signed)
Daily Session Note  Patient Details  Name: [name] MRN: [id number] Date of Birth: Dec 28, 1970 Referring Provider:   Flowsheet Row Cardiac Rehab from 11/13/2021 in [facility] Cardiac and Pulmonary Rehab  Referring Provider Lanette Hampshire MD       Encounter Date: 12/23/2021  Check In:  Session Check In - 12/23/21 1047       Check-In   Supervising physician immediately available to respond to emergencies See telemetry face sheet for immediately available ER MD    Location [facility]-Cardiac & Pulmonary Rehab    Staff Present Heath Lark, RN, BSN, CCRP;Laureen Owens Shark, BS, RRT, CPFT;Joseph [place], [place]    Virtual Visit No    Medication changes reported     No    Fall or balance concerns reported    No    Warm-up and Cool-down Performed on first and last piece of equipment    Resistance Training Performed Yes    VAD Patient? Yes    PAD/SET Patient? No      VAD patient   Has back up controller? Yes    Has spare charged batteries? Yes    Has battery cables? Yes    Has compatible battery clips? Yes      Pain Assessment   Currently in Pain? No/denies                Social History   Tobacco Use  Smoking Status Never  Smokeless Tobacco Never    Goals Met:  Independence with exercise equipment Exercise tolerated well No report of concerns or symptoms today  Goals Unmet:  Not Applicable  Comments: Pt able to follow exercise prescription today without complaint.  Will continue to monitor for progression.    Dr. Emily Filbert is Medical Director for [place].  Dr. Ottie Glazier is Medical Director for [facility] Pulmonary Rehabilitation.

## 2021-12-24 ENCOUNTER — Emergency Department (HOSPITAL_COMMUNITY)
Admission: EM | Admit: 2021-12-24 | Discharge: 2021-12-25 | Disposition: A | Discharge status: Home / Self Care | Payer: BC Managed Care – PPO | Attending: Emergency Medicine | Admitting: Emergency Medicine

## 2021-12-24 ENCOUNTER — Other Ambulatory Visit: Payer: Self-pay

## 2021-12-24 ENCOUNTER — Encounter: Payer: Self-pay | Admitting: Gynecologic Oncology

## 2021-12-24 ENCOUNTER — Encounter (HOSPITAL_COMMUNITY): Payer: Self-pay

## 2021-12-24 DIAGNOSIS — R001 Bradycardia, unspecified: Secondary | ICD-10-CM | POA: Diagnosis not present

## 2021-12-24 DIAGNOSIS — N939 Abnormal uterine and vaginal bleeding, unspecified: Secondary | ICD-10-CM | POA: Diagnosis not present

## 2021-12-24 DIAGNOSIS — Z79899 Other long term (current) drug therapy: Secondary | ICD-10-CM | POA: Insufficient documentation

## 2021-12-24 DIAGNOSIS — R55 Syncope and collapse: Secondary | ICD-10-CM | POA: Insufficient documentation

## 2021-12-24 DIAGNOSIS — D649 Anemia, unspecified: Secondary | ICD-10-CM | POA: Diagnosis not present

## 2021-12-24 DIAGNOSIS — Z7982 Long term (current) use of aspirin: Secondary | ICD-10-CM | POA: Insufficient documentation

## 2021-12-24 DIAGNOSIS — R42 Dizziness and giddiness: Secondary | ICD-10-CM | POA: Diagnosis not present

## 2021-12-24 DIAGNOSIS — R58 Hemorrhage, not elsewhere classified: Secondary | ICD-10-CM | POA: Diagnosis not present

## 2021-12-24 LAB — CBC WITH DIFFERENTIAL/PLATELET
Abs Immature Granulocytes: 0.1 10*3/uL — ABNORMAL HIGH (ref 0.00–0.07)
Basophils Absolute: 0 10*3/uL (ref 0.0–0.1)
Basophils Relative: 0 %
Eosinophils Absolute: 0 10*3/uL (ref 0.0–0.5)
Eosinophils Relative: 0 %
HCT: 24 % — ABNORMAL LOW (ref 36.0–46.0)
Hemoglobin: 7 g/dL — ABNORMAL LOW (ref 12.0–15.0)
Immature Granulocytes: 1 %
Lymphocytes Relative: 14 %
Lymphs Abs: 1.2 10*3/uL (ref 0.7–4.0)
MCH: 25.5 pg — ABNORMAL LOW (ref 26.0–34.0)
MCHC: 29.2 g/dL — ABNORMAL LOW (ref 30.0–36.0)
MCV: 87.3 fL (ref 80.0–100.0)
Monocytes Absolute: 0.7 10*3/uL (ref 0.1–1.0)
Monocytes Relative: 8 %
Neutro Abs: 6.7 10*3/uL (ref 1.7–7.7)
Neutrophils Relative %: 77 %
Platelets: 274 10*3/uL (ref 150–400)
RBC: 2.75 MIL/uL — ABNORMAL LOW (ref 3.87–5.11)
RDW: 24.3 % — ABNORMAL HIGH (ref 11.5–15.5)
WBC: 8.7 10*3/uL (ref 4.0–10.5)
nRBC: 0.3 % — ABNORMAL HIGH (ref 0.0–0.2)

## 2021-12-24 LAB — I-STAT CHEM 8, ED
BUN: 7 mg/dL (ref 6–20)
Calcium, Ion: 1.12 mmol/L — ABNORMAL LOW (ref 1.15–1.40)
Chloride: 104 mmol/L (ref 98–111)
Creatinine, Ser: 1.1 mg/dL — ABNORMAL HIGH (ref 0.44–1.00)
Glucose, Bld: 86 mg/dL (ref 70–99)
HCT: 20 % — ABNORMAL LOW (ref 36.0–46.0)
Hemoglobin: 6.8 g/dL — CL (ref 12.0–15.0)
Potassium: 3.4 mmol/L — ABNORMAL LOW (ref 3.5–5.1)
Sodium: 138 mmol/L (ref 135–145)
TCO2: 21 mmol/L — ABNORMAL LOW (ref 22–32)

## 2021-12-24 LAB — BASIC METABOLIC PANEL
Anion gap: 9 (ref 5–15)
BUN: 8 mg/dL (ref 6–20)
CO2: 22 mmol/L (ref 22–32)
Calcium: 8.6 mg/dL — ABNORMAL LOW (ref 8.9–10.3)
Chloride: 106 mmol/L (ref 98–111)
Creatinine, Ser: 1.1 mg/dL — ABNORMAL HIGH (ref 0.44–1.00)
GFR, Estimated: >60 mL/min (ref >60)
Glucose, Bld: 94 mg/dL (ref 70–99)
Potassium: 3.4 mmol/L — ABNORMAL LOW (ref 3.5–5.1)
Sodium: 137 mmol/L (ref 135–145)

## 2021-12-24 LAB — PROTIME-INR
INR: 2.2 — ABNORMAL HIGH (ref 0.8–1.2)
Prothrombin Time: 23.9 seconds — ABNORMAL HIGH (ref 11.4–15.2)

## 2021-12-24 LAB — LACTATE DEHYDROGENASE: LDH: 140 U/L (ref 98–192)

## 2021-12-24 LAB — PREPARE RBC (CROSSMATCH)

## 2021-12-24 MED ORDER — SODIUM CHLORIDE 0.9% IV SOLUTION: Freq: Once | INTRAVENOUS | Status: DC

## 2021-12-24 MED ORDER — SODIUM CHLORIDE 0.9 % IV BOLUS
250.0000 mL | Freq: Once | INTRAVENOUS | Status: AC
  Administered 2021-12-24: 250 mL via INTRAVENOUS

## 2021-12-24 NOTE — Progress Notes (Addendum)
LVAD Coordinator ED Encounter  [name] a 51 y.o. female that presented to [facility] ER today due to heavy vaginal bleeding. She  has a past medical history of Automatic implantable cardioverter-defibrillator in situ, Chronic CHF (congestive heart failure) ([place]), History of hypothyroidism, History of stomach ulcers, Hyperthyroidism, Hypotension, Morbid obesity ([place]), Myocardial infarction ([place]) (08/2013), Nocturnal dyspnea, Nonischemic cardiomyopathy ([place]), Sinus tachycardia, Sleep apnea, Snoring-prob OSA (09/04/2011), [employer] ICD lead RECALL  6949, Stroke ([place]) (2021), and UARS (upper airway resistance syndrome) (09/04/2011).  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   Received page from pt's husband stating pt has been dizzy, lightheaded, and almost passed out while she was using the bathroom this afternoon. Reports heavy vaginal bleeding x 2.5 weeks with menstrual cycle. This is not new for her as she has a  large complex left cystic mass. Follows closely with her OBGYN.  VAD #s stable.   Orders for labs provided to ER Charge RN.  1700: Hgb down to 7.0 from 8.2 3 weeks ago. Discussed with Dr [name]. Order received for 2 units of PRBC with repeat H&H post transfusion. Provided Dr [name] contact information to Dr [name] so they can discuss pt's plan of care.   Vital signs: HR: 96 Doppler MAP:  Automated BP: O2 Sat: 100%  LVAD interrogation reveals:  Speed: 5800 Flow: 4.0 Power: 4.4  PI: 3.0  Alarms: none  Drive Line: Maintained weekly at home per pt's husband [name].   Significant Events with LVAD:   Updated VAD Providers (Dr [name]) about the above. No LVAD issues and pump is functioning as expected. Able to independently manage LVAD equipment. No LVAD needs at this time.   Per Dr [name] pt is scheduled for follow up in [place] clinic tomorrow 12/25/21 at 11:00.   Miranda Monte RN [place] Coordinator  Office: [phone]  24/7  Pager: [phone]

## 2021-12-24 NOTE — ED Triage Notes (Signed)
Pt BIB [facility] from home d/t dizziness caused by severe vaginal bleeding. Pt stated she has cyst on her ovaries, but she has never been bleeding like this before. Pt stated she almost had a syncopal episode while using the bathroom. Pt currently using pads for her periods, and reported it soaks every 50mns. Pt on LVAD/. LVAD recommended the pt to come in for possible blood transfusion. A&O X4. VSS.

## 2021-12-24 NOTE — ED Notes (Signed)
ED Provider at bedside. 

## 2021-12-24 NOTE — ED Notes (Signed)
Pt's BP down to 91/36. EDP made aware. 242m fluids bolus ordered .

## 2021-12-24 NOTE — ED Provider Notes (Signed)
[facility] EMERGENCY DEPARTMENT Provider Note   CSN: [id number] Arrival date & time: 12/24/21  1605     History  Chief Complaint  Patient presents with   Dizziness   Vaginal Bleeding    [name] is a 51 y.o. female.  51 yo F with a chief complaints of feeling lightheaded and fatigued.  This been going on for a few days now.  She actually passed out today when she was on the toilet.  Was still seeing spots a bit afterwards.  Called 911 and felt better after some IV fluids.  She has been eating and drinking normally.  Denies vomiting or diarrhea.  She has been having very profuse vaginal bleeding since yesterday.  This is atypical for her.  She is on Coumadin for her LVAD.        Home Medications Prior to Admission medications   Medication Sig Start Date End Date Taking? Authorizing Provider  aspirin EC 81 MG tablet Take 1 tablet (81 mg total) by mouth daily. Swallow whole. 10/08/21   Larey Dresser, MD  enoxaparin (LOVENOX) 40 MG/0.4ML injection Inject 0.4 mLs (40 mg total) into the skin every 12 (twelve) hours. Patient not taking: Reported on 11/27/2021 10/14/21   Larey Dresser, MD  methimazole (TAPAZOLE) 10 MG tablet Take 1 tablet (10 mg total) by mouth 3 (three) times daily. 10/29/21   Shamleffer, Melanie Crazier, MD  pantoprazole (PROTONIX) 40 MG tablet Take 1 tablet (40 mg total) by mouth daily. Patient not taking: Reported on 11/27/2021 09/25/21   Angiulli, Lavon Paganini, PA-C  potassium chloride SA (KLOR-CON M) 20 MEQ tablet Take 3 tablets (60 mEq total) by mouth 2 (two) times daily. 12/19/21   Larey Dresser, MD  predniSONE (DELTASONE) 10 MG tablet Take 1.5 tablets (15 mg total) by mouth daily with breakfast. 10/08/21   Shamleffer, Melanie Crazier, MD  quiniDINE gluconate 324 MG CR tablet Take 1 tablet (324 mg total) by mouth 2 (two) times daily. 09/25/21   Angiulli, Lavon Paganini, PA-C  rosuvastatin (CRESTOR) 40 MG tablet Take 1 tablet (40 mg total) by mouth at  bedtime. 09/25/21   Angiulli, Lavon Paganini, PA-C  sacubitril-valsartan (ENTRESTO) 24-26 MG Take 1 tablet by mouth 2 (two) times daily. Patient not taking: Reported on 11/27/2021 10/14/21   Larey Dresser, MD  sildenafil (REVATIO) 20 MG tablet Take 1 tablet (20 mg total) by mouth 3 (three) times daily. 10/28/21   Larey Dresser, MD  spironolactone (ALDACTONE) 25 MG tablet Take 0.5 tablets (12.5 mg total) by mouth daily. 10/07/21 10/02/22  Larey Dresser, MD  torsemide (DEMADEX) 20 MG tablet Take 40 mg by mouth daily. Patient not taking: Reported on 11/27/2021    [provider]  warfarin (COUMADIN) 2 MG tablet Take 4 mg (2 tabs) every Monday/Wednesday/Friday and 2 mg (1 tablet) all other days or as directed by the [facility] 12/05/21   Larey Dresser, MD  zinc gluconate 50 MG tablet Take 1 tablet (50 mg total) by mouth daily. 10/07/21   Larey Dresser, MD      Allergies    Patient has no known allergies.    Review of Systems   Review of Systems  Physical Exam Updated Vital Signs BP (!) 83/69 (BP Location: Left Arm)   Pulse 91   Temp 98 F (36.7 C)   Resp 15   SpO2 95%  Physical Exam Vitals and nursing note reviewed.  Constitutional:  General: She is not in acute distress.    Appearance: She is well-developed. She is not diaphoretic.     Comments: Pale conjunctiva.  Audible hum  HENT:     Head: Normocephalic and atraumatic.  Eyes:     Pupils: Pupils are equal, round, and reactive to light.  Cardiovascular:     Rate and Rhythm: Normal rate and regular rhythm.     Heart sounds: No murmur heard.    No friction rub. No gallop.  Pulmonary:     Effort: Pulmonary effort is normal.     Breath sounds: No wheezing or rales.  Abdominal:     General: There is no distension.     Palpations: Abdomen is soft.     Tenderness: There is no abdominal tenderness.  Musculoskeletal:        General: No tenderness.     Cervical back: Normal range of motion and neck  supple.  Skin:    General: Skin is warm and dry.  Neurological:     Mental Status: She is alert and oriented to person, place, and time.  Psychiatric:        Behavior: Behavior normal.     ED Results / Procedures / Treatments   Labs (all labs ordered are listed, but only abnormal results are displayed) Labs Reviewed  CBC WITH DIFFERENTIAL/PLATELET - Abnormal; Notable for the following components:      Result Value   RBC 2.75 (*)    Hemoglobin 7.0 (*)    HCT 24.0 (*)    MCH 25.5 (*)    MCHC 29.2 (*)    RDW 24.3 (*)    nRBC 0.3 (*)    Abs Immature Granulocytes 0.10 (*)    All other components within normal limits  BASIC METABOLIC PANEL - Abnormal; Notable for the following components:   Potassium 3.4 (*)    Creatinine, Ser 1.10 (*)    Calcium 8.6 (*)    All other components within normal limits  PROTIME-INR - Abnormal; Notable for the following components:   Prothrombin Time 23.9 (*)    INR 2.2 (*)    All other components within normal limits  I-STAT CHEM 8, ED - Abnormal; Notable for the following components:   Potassium 3.4 (*)    Creatinine, Ser 1.10 (*)    Calcium, Ion 1.12 (*)    TCO2 21 (*)    Hemoglobin 6.8 (*)    HCT 20.0 (*)    All other components within normal limits  LACTATE DEHYDROGENASE  TYPE AND SCREEN  PREPARE RBC (CROSSMATCH)    EKG None  Radiology No results found.  Procedures Procedures    Medications Ordered in ED Medications  0.9 %  sodium chloride infusion (Manually program via Guardrails IV Fluids) (has no administration in time range)  sodium chloride 0.9 % bolus 250 mL (250 mLs Intravenous New Bag/Given 12/24/21 1652)    ED Course/ Medical Decision Making/ A&P                           Medical Decision Making Amount and/or Complexity of Data Reviewed Labs: ordered.  Risk Prescription drug management.   51 yo F with a chief complaints of a syncopal event.  This happened earlier today while she was on the toilet.  Could be  vasovagal but the patient is also complaining of profuse vaginal bleeding such as been changing her pads every 30 minutes since yesterday.  She does appear  a bit pale.  Will give a small bolus of IV fluids.  Check blood work.  LVAD coordinator was notified upon arrival per protocol.  Patient's hemoglobin has dropped, on the i-STAT it was 6.8 on the formal CBC it was 7.  Recommendations from the cardiologist, Dr. [name] to give 2 units of blood and 2 reassess.  Plan for close follow-up tomorrow if the patient's vaginal bleeding has improved and her hemoglobin has come up as it should.  Patient's INR is 2.2.  Signed out to Dr. Dina Rich.  Please see their note for further details of care in the ED.  CRITICAL CARE Performed by: Cecilio Asper   Total critical care time: 80 minutes  Critical care time was exclusive of separately billable procedures and treating other patients.  Critical care was necessary to treat or prevent imminent or life-threatening deterioration.  Critical care was time spent personally by me on the following activities: development of treatment plan with patient and/or surrogate as well as nursing, discussions with consultants, evaluation of patient's response to treatment, examination of patient, obtaining history from patient or surrogate, ordering and performing treatments and interventions, ordering and review of laboratory studies, ordering and review of radiographic studies, pulse oximetry and re-evaluation of patient's condition.  The patients results and plan were reviewed and discussed.   Any x-rays performed were independently reviewed by myself.   Differential diagnosis were considered with the presenting HPI.  Medications  0.9 %  sodium chloride infusion (Manually program via Guardrails IV Fluids) (has no administration in time range)  sodium chloride 0.9 % bolus 250 mL (250 mLs Intravenous New Bag/Given 12/24/21 1652)    Vitals:   12/24/21 1945 12/24/21  2000 12/24/21 2100 12/24/21 2200  BP: (!) 57/46 103/73 (!) 83/69   Pulse: 94 91 91 91  Resp: '19 18 20 15  '$ Temp:      TempSrc:      SpO2: 100% 100% 100% 95%    Final diagnoses:  Syncope and collapse  Acute anemia  Vaginal bleeding    Admission/ observation were discussed with the admitting physician, patient and/or family and they are comfortable with the plan.          Final Clinical Impression(s) / ED Diagnoses Final diagnoses:  Syncope and collapse  Acute anemia  Vaginal bleeding    Rx / DC Orders ED Discharge Orders     None         Deno Etienne, DO 12/24/21 2346

## 2021-12-25 ENCOUNTER — Other Ambulatory Visit (HOSPITAL_COMMUNITY): Payer: Self-pay | Admitting: *Deleted

## 2021-12-25 ENCOUNTER — Telehealth (HOSPITAL_COMMUNITY): Payer: Self-pay | Admitting: *Deleted

## 2021-12-25 ENCOUNTER — Telehealth: Payer: Self-pay

## 2021-12-25 ENCOUNTER — Encounter (HOSPITAL_COMMUNITY): Payer: BC Managed Care – PPO

## 2021-12-25 ENCOUNTER — Other Ambulatory Visit: Payer: Self-pay | Admitting: Gynecologic Oncology

## 2021-12-25 DIAGNOSIS — Z7982 Long term (current) use of aspirin: Secondary | ICD-10-CM | POA: Diagnosis not present

## 2021-12-25 DIAGNOSIS — N939 Abnormal uterine and vaginal bleeding, unspecified: Secondary | ICD-10-CM

## 2021-12-25 DIAGNOSIS — Z95811 Presence of heart assist device: Secondary | ICD-10-CM

## 2021-12-25 DIAGNOSIS — Z79899 Other long term (current) drug therapy: Secondary | ICD-10-CM | POA: Diagnosis not present

## 2021-12-25 DIAGNOSIS — R55 Syncope and collapse: Secondary | ICD-10-CM | POA: Diagnosis not present

## 2021-12-25 DIAGNOSIS — Z7901 Long term (current) use of anticoagulants: Secondary | ICD-10-CM

## 2021-12-25 DIAGNOSIS — D649 Anemia, unspecified: Secondary | ICD-10-CM | POA: Diagnosis not present

## 2021-12-25 LAB — HEMOGLOBIN AND HEMATOCRIT, BLOOD
HCT: 24.3 % — ABNORMAL LOW (ref 36.0–46.0)
HCT: 28.3 % — ABNORMAL LOW (ref 36.0–46.0)
Hemoglobin: 7.8 g/dL — ABNORMAL LOW (ref 12.0–15.0)
Hemoglobin: 8.5 g/dL — ABNORMAL LOW (ref 12.0–15.0)

## 2021-12-25 LAB — PREPARE RBC (CROSSMATCH)

## 2021-12-25 MED ORDER — SODIUM CHLORIDE 0.9 % IV BOLUS
500.0000 mL | Freq: Once | INTRAVENOUS | Status: AC
  Administered 2021-12-25: 500 mL via INTRAVENOUS

## 2021-12-25 MED ORDER — MEDROXYPROGESTERONE ACETATE 10 MG PO TABS: 10.0000 mg | ORAL_TABLET | Freq: Every day | ORAL | 0 refills | Status: DC

## 2021-12-25 MED ORDER — SODIUM CHLORIDE 0.9 % IV SOLN
10.0000 mL/h | Freq: Once | INTRAVENOUS | Status: AC
  Administered 2021-12-25: 10 mL/h via INTRAVENOUS

## 2021-12-25 NOTE — ED Notes (Signed)
Awaiting provider update, reevaluation.

## 2021-12-25 NOTE — Progress Notes (Signed)
Repeat H&H with Hgb 8.5.  BP re-checked with results below. Pt denies complaints, Dr. [name] updated. OK to discharge home, ED MD and [name] (GYN Oncologist) updated.    Vital signs:   HR: 92    Automated BP: 97/76 (83)   LVAD:    Speed:  5800  Flow:  4.7  Power:  4.5w PI: 3.3    Per Dr [name] pt is scheduled for labs tomorrow after IV iron infusion. Pt aware.   Zada Girt RN [place] Coordinator  Office: [phone]  24/7 Pager: [phone]

## 2021-12-25 NOTE — ED Notes (Signed)
This RN and another RN attempted to obtain BP. CN now in room attempting to obtain BP.

## 2021-12-25 NOTE — ED Notes (Signed)
ED Provider at bedside. 

## 2021-12-25 NOTE — ED Notes (Signed)
LVAD team member at bedside checking pt BP. Automatic BP 114/100 MAP 107. LVAD team member verifying w/ manual and got 29 for the MAP.

## 2021-12-25 NOTE — ED Notes (Signed)
CN unable to obtain manual BP as well. Per CN, use the automatic and notify LVAD team. Secretary paging LVAD team to this RN's phone.

## 2021-12-25 NOTE — Telephone Encounter (Signed)
Called patient per Dr. [name] post her ED discharge.  VAD team spoke with [name] (GYN Oncology) and informed her Dr. [name] is OK with vaginal surgery asap from Cardiology stand point.  [name] explained she wants to wait six months post VAD implant; if surgery required sooner, she would need to refer Mr. Spengler to tertiary center [facility]). Dr. Charisse March plan is to control bleeding with Progesterone or emobolization. [name] will reach out to Ms. Sheaffer to schedule f/u. Dr. [name] updated.  Asked patient to hold warfarin today per Audry Riles, PharmD. Pt says she has already taken today. She will get repeat cbc and INR tomorrow. Pt will hold warfarin until she hears from [place]. Audry Riles, PharmD updated.  Zada Girt RN, [place] Coordinator [phone]

## 2021-12-25 NOTE — ED Notes (Signed)
Patient denies pain and is resting comfortably.  

## 2021-12-25 NOTE — Telephone Encounter (Signed)
Following up with [name]. Advised patient that a new prescription has been sent in for Provera '10mg'$ . Per [name] this can be increased to '20mg'$  a day if no significant decrease in bleeding over 1-2 days. Instructed patient to stop the norethindrone. A phone visit has been scheduled for 01/01/22 at 4:40pm with [name] for follow up. Patient verbalized understanding and will call with any needs.

## 2021-12-25 NOTE — ED Provider Notes (Addendum)
  Physical Exam  BP (!) 75/61 Comment: LVAD pt, MAP 67  Pulse 87   Temp 98.3 F (36.8 C) (Oral)   Resp 18   SpO2 99%   Physical Exam  Procedures  Procedures  ED Course / MDM   Clinical Course as of 12/25/21 0720  Wed Dec 25, 2021  0554 Spoke with Dr. [name].  Patient has having some ongoing bleeding but not any heavier than she was previously.  Hemoglobin rise to 7.8 after 2 units.  Given that she remains slightly symptomatic with dizziness with ambulation, he recommends transfusing 1/3 unit.  Do not repeat hemoglobin and have her follow-up closely in clinic this morning.  I discussed this with the patient who is agreeable to this plan. [CH]    Clinical Course User Index [CH] Horton, Barbette Hair, MD   Medical Decision Making Amount and/or Complexity of Data Reviewed Labs: ordered.  Risk Prescription drug management.   51 yo female with lvad presented yesterday with heavy vaginal bleeding and hgb of 7. Patient received 2 units of blood and hgb increased to 7.8 Passing clots vaginally Dr. [name] consulted Now ordered 3rd unit then d/c without recheck RN called lvad team after map of 38.  Orthostatics done and map rebounded to 78 3rd unit infuse. LVAD team recommended 500 cc ns and repeat hgb per Dr. [name]  11:56 AM Patient has not received her third unit of blood.  Postinfusion hemoglobin is 8.5. >VAD team is at bedside and checking her map Patient has ongoing vaginal bleeding and is on Coumadin and must stay on Coumadin due to her LVAD.  She also has a pelvic mass and is scheduled to have a GYN oncologic surgery.  The LVAD team is coordinating this.  They have follow-up plans in place to recheck her hemoglobin.  Patient is aware of all return precautions.  Patient with map 8, lvad coordinator advises patient safe for d/c. They are assisting with f/u   Pattricia Boss, MD 12/25/21 6389    Pattricia Boss, MD 12/25/21 1215

## 2021-12-25 NOTE — Telephone Encounter (Signed)
Spoke with patient this afternoon to verify if she is taking progesterone. Patient states she recently went to [facility] and they prescribed her norethindrone. Advised patient that the provider will be notified and someone from the office will follow up with her. Patient verbalized understanding.

## 2021-12-25 NOTE — ED Notes (Signed)
LVAD team member on the way to assist in obtain pt BP

## 2021-12-25 NOTE — ED Notes (Signed)
Reviewed discharge instructions with patient and spouse. Follow-up care reviewed. Patient and spouse verbalized understanding. Patient A&Ox4, VSS, and ambulatory with steady gait upon discharge. 

## 2021-12-25 NOTE — ED Provider Notes (Signed)
Patient signed out pending 2 units transfusion for ongoing vaginal bleeding.  She is an LVAD with coagulopathy and on blood thinners.  She has had some ongoing passage of clots while in the emergency department although she has not had to change her pad/diaper recently.  Following 2 units of transfusion, patient states she feels somewhat better but is having some persistent dizziness.  She ambulated to the bathroom and reports that she felt unsteady on her feet and had ongoing dizziness.  For this reason I touch base with Dr. [name].  Patient did not have fully appropriate rise in her hemoglobin after 2 units.  Hemoglobin went from 6.8-7.8.  Dr. [name] requests 1 additional unit of blood given and patient discharged to clinic later this morning.  Patient is agreeable to plan.  Clinical Course as of 12/25/21 0557  Wed Dec 25, 2021  0554 Spoke with Dr. [name].  Patient has having some ongoing bleeding but not any heavier than she was previously.  Hemoglobin rise to 7.8 after 2 units.  Given that she remains slightly symptomatic with dizziness with ambulation, he recommends transfusing 1/3 unit.  Do not repeat hemoglobin and have her follow-up closely in clinic this morning.  I discussed this with the patient who is agreeable to this plan. [CH]    Clinical Course User Index [CH] Mete Purdum, Barbette Hair, MD      Merryl Hacker, MD 12/25/21 [phone]

## 2021-12-25 NOTE — Progress Notes (Signed)
Per [name], provera 10 mg once daily sent in due to abnormal uterine bleeding (can increased to '20mg'$  q day if no significant decrease in bleeding over 1-2 days). Also plan for follow-up with [name] via phone in the next 1-2 weeks.

## 2021-12-25 NOTE — Telephone Encounter (Signed)
Per provider reached out to [name] to arrange for patient follow up and inquire if patient has had an EMB. Spoke with [name] who states patients first appointment with them was on 12/05/21. Patient did not have an EMB at this visit and they do not have records of an EMB. Per [name] patient needs to be scheduled for an EMB and follow up after recent ER visit for vaginal bleeding. If [facility] is not able to schedule this we can schedule this at our office. [name] is going to speak with the provider and try to schedule [name] for follow up with EMB. She will notify our office once this is scheduled or if she is unable to schedule.

## 2021-12-25 NOTE — Discharge Instructions (Signed)
Please follow-up as planned with your LVAD team. Return if you are having worsening bleeding, get weaker, get lightheaded, or feel worse

## 2021-12-25 NOTE — ED Notes (Signed)
Orthostatic VS  Lying 114/100 MAP 107 Sitting 78/46 MAP 55 HR 99 Standing 86/72 MAP 78  HR 100  No dizziness reported by pt. LVAD RN notifying [name] MD

## 2021-12-25 NOTE — Telephone Encounter (Signed)
Received call from [place] at [facility]. Patient is scheduled to see them tomorrow for a follow up and EMB at 08:45am. [name] has communicated appointment details to patient and will send over pathology results once they have them. Providers notified.

## 2021-12-25 NOTE — ED Notes (Signed)
Patient wheeled to bathroom, when patient returned to room patient expressed "I see the whole room spinning, I had to sit down right away". Occurs mainly with movement.

## 2021-12-25 NOTE — ED Notes (Addendum)
Patient expresses feeling "unwell" still. Denies any pain and resting.

## 2021-12-25 NOTE — Progress Notes (Signed)
VAD Coordinator paged to ED; nurse reports they are having difficulty obtained BP.   Patient awake, lying in bed. Third unit PCs infusion completing now.  Patient reports she was OOB to BR and denies any dizziness or lightheadedness. She does report continued "moderate" vaginal bleeding.   Doppler BP left antecubital difficult to hear. Orthostatics obtained; pt denies any orthostatic symptoms.   Vital signs:  Lying:  Lying:    Sitting: Standing: HR: 96   88  89    97  105 Doppler MAP:   40  84 Automated BP: 62/30 (38) 114/100 (107)     78/46 (55) 86/72 (78) O2 Sat: 100%  LVAD:  Lying: Sitting: Standing:  Speed: 5800   Flow: 4.4  4.7  4.6 Power: 4.45w PI: 3.5   3.3  3.8  Events:  rare Alarms:  none  Dr. [name] updated - order for additional 500cc NS given to [facility] nurse, Raquel Sarna. Planned repeat H&H two hours after third PC infusion complete with possible discharge home if labs wnl and patient remains asymptomatic.   VAD Coordinator messaged Dr. Jeral Pinch in Epic and called her office and left message per Dr. [name]. He is asking for vaginal surgery asap. Patient will need to be admitted to [facility] (for heparin bridge) Dr. Claris Gladden team for VAD management.   Per Dr [name] pt is scheduled for labs tomorrow after IV iron infusion. Pt aware.   Zada Girt RN [place] Coordinator  Office: [phone]  24/7 Pager: [phone]

## 2021-12-26 ENCOUNTER — Other Ambulatory Visit (HOSPITAL_COMMUNITY): Payer: BC Managed Care – PPO

## 2021-12-26 ENCOUNTER — Other Ambulatory Visit (HOSPITAL_COMMUNITY): Payer: Self-pay | Admitting: *Deleted

## 2021-12-26 ENCOUNTER — Ambulatory Visit (INDEPENDENT_AMBULATORY_CARE_PROVIDER_SITE_OTHER): Payer: BC Managed Care – PPO | Admitting: Internal Medicine

## 2021-12-26 ENCOUNTER — Ambulatory Visit (HOSPITAL_COMMUNITY): Payer: Self-pay | Admitting: Pharmacist

## 2021-12-26 ENCOUNTER — Ambulatory Visit: Payer: BC Managed Care – PPO | Admitting: Internal Medicine

## 2021-12-26 ENCOUNTER — Encounter: Payer: Self-pay | Admitting: Internal Medicine

## 2021-12-26 ENCOUNTER — Ambulatory Visit (HOSPITAL_COMMUNITY)
Admission: RE | Admit: 2021-12-26 | Discharge: 2021-12-26 | Disposition: A | Discharge status: Home / Self Care | Payer: BC Managed Care – PPO | Source: Ambulatory Visit | Attending: Cardiology | Admitting: Cardiology

## 2021-12-26 VITALS — HR 76 | Ht 63.0 in | Wt 233.0 lb

## 2021-12-26 VITALS — BP 94/57 | HR 89 | Temp 97.3°F | Resp 16 | Wt 233.0 lb

## 2021-12-26 DIAGNOSIS — R5381 Other malaise: Secondary | ICD-10-CM | POA: Diagnosis not present

## 2021-12-26 DIAGNOSIS — Z95811 Presence of heart assist device: Secondary | ICD-10-CM

## 2021-12-26 DIAGNOSIS — N939 Abnormal uterine and vaginal bleeding, unspecified: Secondary | ICD-10-CM | POA: Diagnosis not present

## 2021-12-26 DIAGNOSIS — Z6841 Body Mass Index (BMI) 40.0 and over, adult: Secondary | ICD-10-CM | POA: Diagnosis not present

## 2021-12-26 DIAGNOSIS — D649 Anemia, unspecified: Secondary | ICD-10-CM | POA: Diagnosis not present

## 2021-12-26 DIAGNOSIS — E064 Drug-induced thyroiditis: Secondary | ICD-10-CM

## 2021-12-26 DIAGNOSIS — T462X5A Adverse effect of other antidysrhythmic drugs, initial encounter: Secondary | ICD-10-CM

## 2021-12-26 DIAGNOSIS — Z7901 Long term (current) use of anticoagulants: Secondary | ICD-10-CM

## 2021-12-26 LAB — BPAM RBC
Blood Product Expiration Date: 202307302359
Blood Product Expiration Date: 202307302359
Blood Product Expiration Date: 202307302359
ISSUE DATE / TIME: 202307041810
ISSUE DATE / TIME: 202307050022
ISSUE DATE / TIME: 202307050615
Unit Type and Rh: 7300
Unit Type and Rh: 7300
Unit Type and Rh: 7300

## 2021-12-26 LAB — TYPE AND SCREEN
ABO/RH(D): B POS
Antibody Screen: NEGATIVE
Unit division: 0
Unit division: 0
Unit division: 0

## 2021-12-26 LAB — CBC
HCT: 26.2 % — ABNORMAL LOW (ref 36.0–46.0)
Hemoglobin: 8 g/dL — ABNORMAL LOW (ref 12.0–15.0)
MCH: 27.1 pg (ref 26.0–34.0)
MCHC: 30.5 g/dL (ref 30.0–36.0)
MCV: 88.8 fL (ref 80.0–100.0)
Platelets: 259 10*3/uL (ref 150–400)
RBC: 2.95 MIL/uL — ABNORMAL LOW (ref 3.87–5.11)
RDW: 21.4 % — ABNORMAL HIGH (ref 11.5–15.5)
WBC: 10.2 10*3/uL (ref 4.0–10.5)
nRBC: 0.4 % — ABNORMAL HIGH (ref 0.0–0.2)

## 2021-12-26 LAB — PROTIME-INR
INR: 2.2 — ABNORMAL HIGH (ref 0.8–1.2)
Prothrombin Time: 24 seconds — ABNORMAL HIGH (ref 11.4–15.2)

## 2021-12-26 LAB — T4, FREE: Free T4: 1.2 ng/dL (ref 0.60–1.60)

## 2021-12-26 LAB — TSH: TSH: 5.4 u[IU]/mL (ref 0.35–5.50)

## 2021-12-26 MED ORDER — PREDNISONE 10 MG PO TABS: 10.0000 mg | ORAL_TABLET | Freq: Every day | ORAL | 1 refills | Status: DC

## 2021-12-26 MED ORDER — METHIMAZOLE 10 MG PO TABS
10.0000 mg | ORAL_TABLET | Freq: Two times a day (BID) | ORAL | 1 refills | Status: DC
Start: 2021-12-26 — End: 2022-05-12

## 2021-12-26 MED ORDER — SODIUM CHLORIDE 0.9 % IV SOLN
510.0000 mg | INTRAVENOUS | Status: DC
  Administered 2021-12-26: 510 mg via INTRAVENOUS
  Filled 2021-12-26: qty 510

## 2021-12-26 NOTE — Progress Notes (Signed)
Name: [name]  MRN/ DOB: [id number], 1970/12/05    Age/ Sex: 51 y.o., female    PCP: Patient, No Pcp Per   Reason for Endocrinology Evaluation: Hyperthyroid     Date of Initial Endocrinology Evaluation: 10/08/2021    HPI: [name] is a 51 y.o. female with a past medical history of CHF, nonischemic cardiomyopathy (S/P ICD placement), CVA, pulmonary HTN, paroxysmal A-fib. The patient presented for initial endocrinology clinic visit on 4/18/2023for consultative assistance with her Hyperthyroidism.   Patient with extensive cardiac history to include A-fib, and nonischemic cardiomyopathy (s/p insertion of ICD) as well as left MCA territory CVA, s/p thrombectomy and stent placement.   The patient presented to the ED on 08/25/2021 with palpitations and shortness of breath and was discharged on 09/20/2021   The patient has been on LT-for replacement since 2014 due to hypothyroid  In review of her history the patient has been noted fluctuating TSH level of 10.272 u IU/mL but by 08/26/2021 her TSH was suppressed at < 0.010 uIU/mL Levothyroxine was stopped  and Methimazole started 08/26/2021  Amiodarone was initiated 07/2018  Thyroid ultrasound shows absent left thyroid tissue 08/2021   No FH of thyroid disease No Biotin intake  No prior thyroid /neck sx   She was already on methimazole and prednisone on her initial visit with me which we have increased   SUBJECTIVE:    Today (12/26/21): [name] is here for follow-up on hyperthyroidism    She is accompanied her spouse  Weight has increased Has mild constipation  Palpitations have improved  Denies local neck swelling  Denies abdominal pain or vomiting    methimazole 10 mg, 1 tablet 3 times daily prednisone 10 mg to 1.5 tabs daily with breakfast   HISTORY:  Past Medical History:  Past Medical History:  Diagnosis Date   Automatic implantable cardioverter-defibrillator in situ    Chronic CHF (congestive heart  failure) ([place])    a. EF 15-20% b. RHC (09/2013) RA 14, RV 57/22, PA 64/36 (48), PCWP 18, FIck CO/CI 3.7/1.6, PVR 8.1 WU, PA sat 47%    History of hypothyroidism    History of stomach ulcers    Hyperthyroidism    Hypotension    Morbid obesity ([place])    Myocardial infarction ([place]) 08/2013   Nocturnal dyspnea    Nonischemic cardiomyopathy ([place])    Sinus tachycardia    Sleep apnea    Snoring-prob OSA 09/04/2011   S[employer] Fidel[employer] ICD lead RECALL  6949    Stroke ([place]) 2021   some residual right-sided weakness   UARS (upper airway resistance syndrome) 09/04/2011   HST 12/2013:  AHI 4/hr (numerous episodes of airflow reduction that did not have concomitant desaturation)    Past Surgical History:  Past Surgical History:  Procedure Laterality Date   BREATH TEK H PYLORI N/A 11/09/2014   Procedure: BREATH TEK H PYLORI;  Surgeon: Greer Pickerel, MD;  Location: [facility];  Service: General;  Laterality: N/A;   CARDIAC CATHETERIZATION  ~ 2006; 09/2013   CARDIAC CATHETERIZATION N/A 05/23/2016   Procedure: Right Heart Cath;  Surgeon: Larey Dresser, MD;  Location: [facility];  Service: Cardiovascular;  Laterality: N/A;   CARDIAC DEFIBRILLATOR PLACEMENT  2006; 12/26/2013   Medtronic [id number]; 12-2013 ICD gen change and RV lead revision with new 6935 RV lead by Dr [name]   CENTRAL LINE INSERTION  08/28/2021   Procedure: CENTRAL LINE INSERTION;  Surgeon: Larey Dresser, MD;  Location: [facility];  Service: Cardiovascular;;   CESAREAN SECTION  1999   IMPLANTABLE CARDIOVERTER DEFIBRILLATOR GENERATOR CHANGE N/A 12/26/2013   Procedure: IMPLANTABLE CARDIOVERTER DEFIBRILLATOR GENERATOR CHANGE;  Surgeon: Deboraha Sprang, MD;  Location: [facility] CATH LAB;  Service: Cardiovascular;  Laterality: N/A;   INSERTION OF IMPLANTABLE LEFT VENTRICULAR ASSIST DEVICE N/A 09/03/2021   Procedure: INSERTION OF IMPLANTABLE LEFT VENTRICULAR ASSIST DEVICE/ Heartmate 3;  Surgeon: Dahlia Byes, MD;  Location: [facility];   Service: Open Heart Surgery;  Laterality: N/A;   IR CT HEAD LTD  04/17/2020   IR CT HEAD LTD  04/17/2020   IR INTRA CRAN STENT  04/17/2020   IR PERCUTANEOUS ART THROMBECTOMY/INFUSION INTRACRANIAL INC DIAG ANGIO  04/17/2020   IR RADIOLOGIST EVAL & MGMT  06/05/2020   LEAD REVISION N/A 12/26/2013   Procedure: LEAD REVISION;  Surgeon: Deboraha Sprang, MD;  Location: [facility] CATH LAB;  Service: Cardiovascular;  Laterality: N/A;   PLACEMENT OF IMPELLA LEFT VENTRICULAR ASSIST DEVICE Right 08/29/2021   Procedure: PLACEMENT OF IMPELLA 5.5 LEFT VENTRICULAR ASSIST DEVICE;  Surgeon: Gaye Pollack, MD;  Location: [facility];  Service: Open Heart Surgery;  Laterality: Right;   RADIOLOGY WITH ANESTHESIA N/A 04/17/2020   Procedure: Code Stroke;  Surgeon: Luanne Bras, MD;  Location: [facility];  Service: Radiology;  Laterality: N/A;   REMOVAL OF IMPELLA LEFT VENTRICULAR ASSIST DEVICE N/A 09/03/2021   Procedure: REMOVAL OF IMPELLA LEFT VENTRICULAR ASSIST DEVICE;  Surgeon: Dahlia Byes, MD;  Location: [facility];  Service: Open Heart Surgery;  Laterality: N/A;   RIGHT HEART CATH N/A 08/28/2021   Procedure: RIGHT HEART CATH;  Surgeon: Larey Dresser, MD;  Location: [facility];  Service: Cardiovascular;  Laterality: N/A;   RIGHT HEART CATHETERIZATION N/A 02/15/2014   Procedure: RIGHT HEART CATH;  Surgeon: Larey Dresser, MD;  Location: [facility] CATH LAB;  Service: Cardiovascular;  Laterality: N/A;   TEE WITHOUT CARDIOVERSION N/A 08/29/2021   Procedure: TRANSESOPHAGEAL ECHOCARDIOGRAM (TEE);  Surgeon: Gaye Pollack, MD;  Location: [facility];  Service: Open Heart Surgery;  Laterality: N/A;   TEE WITHOUT CARDIOVERSION N/A 09/03/2021   Procedure: TRANSESOPHAGEAL ECHOCARDIOGRAM (TEE);  Surgeon: Dahlia Byes, MD;  Location: [facility];  Service: Open Heart Surgery;  Laterality: N/A;   TUBAL LIGATION  1999    Social History:  reports that she has never smoked. She has never used smokeless tobacco. She reports that she does not drink  alcohol and does not use drugs. Family History: family history includes Heart disease in her maternal grandmother and mother; High blood pressure in her father; Obesity in her mother; Stroke in her father.   HOME MEDICATIONS: Allergies as of 12/26/2021   No Known Allergies      Medication List        Accurate as of December 26, 2021  3:13 PM. If you have any questions, ask your nurse or doctor.          STOP taking these medications    Entresto 24-26 MG Generic drug: sacubitril-valsartan Stopped by: Dorita Sciara, MD   pantoprazole 40 MG tablet Commonly known as: PROTONIX Stopped by: Dorita Sciara, MD       TAKE these medications    aspirin EC 81 MG tablet Take 1 tablet (81 mg total) by mouth daily. Swallow whole.   enoxaparin 40 MG/0.4ML injection Commonly known as: LOVENOX Inject 0.4 mLs (40 mg total) into the skin every 12 (twelve) hours.   medroxyPROGESTERone 10 MG  tablet Commonly known as: Provera Take 1 tablet (10 mg total) by mouth daily.   methimazole 10 MG tablet Commonly known as: TAPAZOLE Take 1 tablet (10 mg total) by mouth 2 (two) times daily. What changed: when to take this Changed by: Dorita Sciara, MD   norethindrone 0.35 MG tablet Commonly known as: MICRONOR Take 1 tablet by mouth daily.   potassium chloride SA 20 MEQ tablet Commonly known as: KLOR-CON M Take 3 tablets (60 mEq total) by mouth 2 (two) times daily.   predniSONE 10 MG tablet Commonly known as: DELTASONE Take 1 tablet (10 mg total) by mouth daily with breakfast. What changed: how much to take Changed by: Dorita Sciara, MD   quiniDINE gluconate 324 MG CR tablet Take 1 tablet (324 mg total) by mouth 2 (two) times daily.   rosuvastatin 40 MG tablet Commonly known as: CRESTOR Take 1 tablet (40 mg total) by mouth at bedtime.   sildenafil 20 MG tablet Commonly known as: REVATIO Take 1 tablet (20 mg total) by mouth 3 (three) times daily.    spironolactone 25 MG tablet Commonly known as: ALDACTONE Take 0.5 tablets (12.5 mg total) by mouth daily.   torsemide 20 MG tablet Commonly known as: DEMADEX Take 40 mg by mouth daily.   warfarin 2 MG tablet Commonly known as: COUMADIN Take as directed by the anticoagulation clinic. If you are unsure how to take this medication, talk to your nurse or doctor. Original instructions: Take 4 mg (2 tabs) every Monday/Wednesday/Friday and 2 mg (1 tablet) all other days or as directed by the [facility]   zinc gluconate 50 MG tablet Take 1 tablet (50 mg total) by mouth daily.          REVIEW OF SYSTEMS: A comprehensive ROS was conducted with the patient and is negative except as per HPI    OBJECTIVE:  VS: Pulse 76   Ht '5\' 3"'$  (1.6 m)   Wt 233 lb (105.7 kg)   BMI 41.27 kg/m    Wt Readings from Last 3 Encounters:  12/26/21 233 lb (105.7 kg)  12/26/21 233 lb (105.7 kg)  12/19/21 225 lb (102.1 kg)     EXAM: General: Pt appears well and is in NAD  Eyes: External eye exam normal without stare, lid lag or exophthalmos.  EOM intact.    Neck: General: Supple without adenopathy. Thyroid: Thyroid size normal.  No goiter or nodules appreciated.   Lungs: Clear with good BS bilat with no rales, rhonchi, or wheezes  Extremities:  BL LE: No pretibial edema normal ROM and strength.  Mental Status: Judgment, insight: Intact Orientation: Oriented to time, place, and person Mood and affect: No depression, anxiety, or agitation     DATA REVIEWED:     Latest Reference Range & Units 12/26/21 10:29  TSH 0.35 - 5.50 uIU/mL 5.40  T4,Free(Direct) 0.60 - 1.60 ng/dL 1.20    Latest Reference Range & Units 10/07/21 11:01  Sodium 135 - 145 mmol/L 139  Potassium 3.5 - 5.1 mmol/L 3.9  Chloride 98 - 111 mmol/L 102  CO2 22 - 32 mmol/L 31  Glucose 70 - 99 mg/dL 79  BUN 6 - 20 mg/dL 10  Creatinine 0.44 - 1.00 mg/dL 0.90  Calcium 8.9 - 10.3 mg/dL 9.3  Anion gap 5 - 15  6   Alkaline Phosphatase 38 - 126 U/L 106  Albumin 3.5 - 5.0 g/dL 3.2 (L)  AST 15 - 41 U/L 31  ALT 0 -  44 U/L 45 (H)  Total Protein 6.5 - 8.1 g/dL 6.1 (L)    Thyroid ultrasound 08/27/2021  Right thyroid lobe is moderately heterogeneous with at least 2 small nodules, largest measuring 0.5 cm. These nodules do not meet criteria for biopsy or dedicated follow-up.   No definite thyroid tissue in the expected region of the left thyroid lobe. 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   IMPRESSION: 1. 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 2. Thyroid tissue is heterogeneous with small nodules. These nodules do not meet criteria for biopsy or dedicated follow-up.  Old records , labs and images have been reviewed.    ASSESSMENT/PLAN/RECOMMENDATIONS:   Amiodarone-induced thyroid disease:   -The patient was initially diagnosed with hypothyroidism and was on LT-for replacement for decades, prior to developing hyperthyroid and 08/2021 -LT-for replacement was discontinued and she has been on methimazole since 08/26/2021 as well as prednisone -Amiodarone causes two different types of thyroid dysfunction. In Type I , there's increased synthesis of T4 and T3 and requires thionamide therapy vs in Type II, there's destruction of the thyroid gland which results in excess T4 and T3 and requires prednisone for treatment. Although it is critical to distinguish between the two types, since therapy differs. However, the distinction maybe difficult using clinical criteria , partly because some patients may have a mixture of both mechanisms.  -Her TFTs today show normalization of free T4, and elevated TSH, will reduce her medications as below    Medications : Decrease methimazole 10 mg, 1 tablet twice daily Decrease prednisone 10 mg to 1 tab daily with breakfast   Follow-up in 4 months Labs in 8 weeks   Signed electronically by: Mack Guise, MD  [facility] End[facility]  [facility] [address] [place], [place] [address] Phone: [phone] FAX: [phone]   CC: Patient, No Pcp Per No address on file Phone: None Fax: None   Return to Endocrinology clinic as below: Future Appointments  Date Time Provider [place]  12/27/2021  9:45 AM [facility] [place] None  12/30/2021  9:45 AM [facility] [place] None  12/30/2021  8:00 PM Sueanne Margarita, MD [facility]  12/31/2021  9:00 AM [facility] None  01/01/2022  9:45 AM [facility] PROGRAM SESSION [facility] None  01/01/2022  4:40 PM Lafonda Mosses, MD [facility] None  01/03/2022  9:45 AM [facility] PROGRAM SESSION [facility] None  01/06/2022  9:45 AM [facility] PROGRAM SESSION [facility] None  01/08/2022  9:45 AM [facility] PRO[facility] SESSION [facility] None  01/10/2022  9:45 AM [facility] PRO[facility] SESSION [facility] None  01/13/2022  9:45 AM [facility] PROGRAM SESSION [facility] None  01/15/2022  9:45 AM [facility] PROGRAM SESSION [facility] None  01/17/2022  9:45 AM [facility] PROGRAM SESSION [facility] None  01/20/2022  9:45 AM [facility] PROGRAM SESSION [facility] None  01/22/2022  9:45 AM [facility] PROGRAM SESSION [facility] None  01/24/2022  9:45 AM [facility] PROGRAM SESSION [facility] None  01/27/2022  9:45 AM [facility] PROGRAM SESSION [facility] None  01/29/2022  9:45 AM [facility] PROGRAM SESSION [facility] None  01/31/2022  9:45 AM [facility] PROGRAM SESSION [facility] None  02/03/2022  9:45 AM [facility] PROGRAM SESSION [facility] None  02/03/2022 10:00 AM [facility] 1 [facility] [place]  [facility]  02/04/2022 10:10 AM [facility] DEVICE REMOTES [facility]  02/05/2022  9:45 AM [facility] PROGRAM SESSION  [facility] None  02/05/2022  4:00 PM Lafonda Mosses, MD [facility] None  02/07/2022  9:45 AM [facility] PRO[facility] SESSION [facility] None  02/10/2022  9:45 AM [facility] PROGRAM SESSION [facility] None  02/12/2022  9:45 AM [facility] PROGRAM SESSION [facility] None  05/06/2022 10:10 AM [facility] DEVICE REMOTES [facility]  05/12/2022  8:30 AM Kylar Leonhardt, Melanie Crazier, MD [facility] None

## 2021-12-27 ENCOUNTER — Other Ambulatory Visit (HOSPITAL_COMMUNITY): Payer: BC Managed Care – PPO

## 2021-12-27 LAB — T3: T3, Total: 98 ng/dL (ref 76–181)

## 2021-12-30 ENCOUNTER — Encounter: Payer: BC Managed Care – PPO | Admitting: *Deleted

## 2021-12-30 ENCOUNTER — Ambulatory Visit (HOSPITAL_BASED_OUTPATIENT_CLINIC_OR_DEPARTMENT_OTHER): Payer: BC Managed Care – PPO | Attending: Cardiology | Admitting: Cardiology

## 2021-12-30 DIAGNOSIS — Z95811 Presence of heart assist device: Secondary | ICD-10-CM | POA: Diagnosis not present

## 2021-12-30 DIAGNOSIS — G4733 Obstructive sleep apnea (adult) (pediatric): Secondary | ICD-10-CM | POA: Diagnosis not present

## 2021-12-30 DIAGNOSIS — R0683 Snoring: Secondary | ICD-10-CM | POA: Insufficient documentation

## 2021-12-30 DIAGNOSIS — I5042 Chronic combined systolic (congestive) and diastolic (congestive) heart failure: Secondary | ICD-10-CM

## 2021-12-30 NOTE — Progress Notes (Signed)
Daily Session Note  Patient Details  Name: [name] MRN: [id number] Date of Birth: 21-Mar-1971 Referring Provider:   Flowsheet Row Cardiac Rehab from 11/13/2021 in [facility] Cardiac and Pulmonary Rehab  Referring Provider [name] MD       Encounter Date: 12/30/2021  Check In:  Session Check In - 12/30/21 1028       Check-In   Supervising physician immediately available to respond to emergencies See telemetry face sheet for immediately available ER MD    Location [facility]    Staff Present Heath Lark, RN, BSN, CCRP;Joseph Ardmore, RCP,RRT,BSRT;Kelly Boonton, BS, ACSM CEP, Exercise Physiologist    Virtual Visit No    Medication changes reported     No    Fall or balance concerns reported    No    Warm-up and Cool-down Performed on first and last piece of equipment    Resistance Training Performed Yes    VAD Patient? Yes    PAD/SET Patient? No      VAD patient   Has back up controller? Yes    Has spare charged batteries? Yes    Has battery cables? Yes    Has compatible battery clips? Yes      Pain Assessment   Currently in Pain? No/denies                Social History   Tobacco Use  Smoking Status Never  Smokeless Tobacco Never    Goals Met:  Independence with exercise equipment Exercise tolerated well No report of concerns or symptoms today  Goals Unmet:  Not Applicable  Comments: Pt able to follow exercise prescription today without complaint.  Will continue to monitor for progression.    Dr. Emily Filbert is Medical Director for [facility].  Dr. Ottie Glazier is Medical Director for [facility] Pulmonary Rehabilitation.

## 2021-12-31 ENCOUNTER — Ambulatory Visit (HOSPITAL_COMMUNITY)
Admission: RE | Admit: 2021-12-31 | Discharge: 2021-12-31 | Disposition: A | Discharge status: Home / Self Care | Payer: BC Managed Care – PPO | Source: Ambulatory Visit | Attending: Cardiology | Admitting: Cardiology

## 2021-12-31 ENCOUNTER — Ambulatory Visit (HOSPITAL_COMMUNITY): Payer: Self-pay | Admitting: Pharmacist

## 2021-12-31 ENCOUNTER — Encounter (HOSPITAL_COMMUNITY): Payer: Self-pay

## 2021-12-31 VITALS — BP 126/95 | HR 96 | Wt 233.4 lb

## 2021-12-31 DIAGNOSIS — N183 Chronic kidney disease, stage 3 unspecified: Secondary | ICD-10-CM | POA: Insufficient documentation

## 2021-12-31 DIAGNOSIS — Z95811 Presence of heart assist device: Secondary | ICD-10-CM | POA: Diagnosis not present

## 2021-12-31 DIAGNOSIS — Z6841 Body Mass Index (BMI) 40.0 and over, adult: Secondary | ICD-10-CM | POA: Diagnosis not present

## 2021-12-31 DIAGNOSIS — E46 Unspecified protein-calorie malnutrition: Secondary | ICD-10-CM | POA: Insufficient documentation

## 2021-12-31 DIAGNOSIS — I130 Hypertensive heart and chronic kidney disease with heart failure and stage 1 through stage 4 chronic kidney disease, or unspecified chronic kidney disease: Secondary | ICD-10-CM | POA: Diagnosis not present

## 2021-12-31 DIAGNOSIS — I493 Ventricular premature depolarization: Secondary | ICD-10-CM | POA: Insufficient documentation

## 2021-12-31 DIAGNOSIS — Z8673 Personal history of transient ischemic attack (TIA), and cerebral infarction without residual deficits: Secondary | ICD-10-CM | POA: Diagnosis not present

## 2021-12-31 DIAGNOSIS — G4733 Obstructive sleep apnea (adult) (pediatric): Secondary | ICD-10-CM | POA: Diagnosis not present

## 2021-12-31 DIAGNOSIS — N920 Excessive and frequent menstruation with regular cycle: Secondary | ICD-10-CM | POA: Insufficient documentation

## 2021-12-31 DIAGNOSIS — I4891 Unspecified atrial fibrillation: Secondary | ICD-10-CM | POA: Insufficient documentation

## 2021-12-31 DIAGNOSIS — Z7952 Long term (current) use of systemic steroids: Secondary | ICD-10-CM | POA: Diagnosis not present

## 2021-12-31 DIAGNOSIS — N939 Abnormal uterine and vaginal bleeding, unspecified: Secondary | ICD-10-CM | POA: Diagnosis not present

## 2021-12-31 DIAGNOSIS — Z79899 Other long term (current) drug therapy: Secondary | ICD-10-CM | POA: Insufficient documentation

## 2021-12-31 DIAGNOSIS — I428 Other cardiomyopathies: Secondary | ICD-10-CM | POA: Diagnosis not present

## 2021-12-31 DIAGNOSIS — E0590 Thyrotoxicosis, unspecified without thyrotoxic crisis or storm: Secondary | ICD-10-CM | POA: Insufficient documentation

## 2021-12-31 DIAGNOSIS — Z7982 Long term (current) use of aspirin: Secondary | ICD-10-CM | POA: Insufficient documentation

## 2021-12-31 DIAGNOSIS — I2721 Secondary pulmonary arterial hypertension: Secondary | ICD-10-CM | POA: Insufficient documentation

## 2021-12-31 DIAGNOSIS — I081 Rheumatic disorders of both mitral and tricuspid valves: Secondary | ICD-10-CM | POA: Diagnosis not present

## 2021-12-31 DIAGNOSIS — D649 Anemia, unspecified: Secondary | ICD-10-CM | POA: Diagnosis not present

## 2021-12-31 DIAGNOSIS — I5022 Chronic systolic (congestive) heart failure: Secondary | ICD-10-CM | POA: Insufficient documentation

## 2021-12-31 DIAGNOSIS — Z95810 Presence of automatic (implantable) cardiac defibrillator: Secondary | ICD-10-CM | POA: Insufficient documentation

## 2021-12-31 DIAGNOSIS — Z7901 Long term (current) use of anticoagulants: Secondary | ICD-10-CM

## 2021-12-31 DIAGNOSIS — N83202 Unspecified ovarian cyst, left side: Secondary | ICD-10-CM | POA: Diagnosis not present

## 2021-12-31 LAB — CBC
HCT: 30.2 % — ABNORMAL LOW (ref 36.0–46.0)
Hemoglobin: 9.1 g/dL — ABNORMAL LOW (ref 12.0–15.0)
MCH: 27.2 pg (ref 26.0–34.0)
MCHC: 30.1 g/dL (ref 30.0–36.0)
MCV: 90.4 fL (ref 80.0–100.0)
Platelets: 319 10*3/uL (ref 150–400)
RBC: 3.34 MIL/uL — ABNORMAL LOW (ref 3.87–5.11)
RDW: 21.4 % — ABNORMAL HIGH (ref 11.5–15.5)
WBC: 9.5 10*3/uL (ref 4.0–10.5)
nRBC: 0 % (ref 0.0–0.2)

## 2021-12-31 LAB — LACTATE DEHYDROGENASE: LDH: 192 U/L (ref 98–192)

## 2021-12-31 LAB — TYPE AND SCREEN
ABO/RH(D): B POS
Antibody Screen: NEGATIVE

## 2021-12-31 LAB — BASIC METABOLIC PANEL
Anion gap: 9 (ref 5–15)
BUN: 12 mg/dL (ref 6–20)
CO2: 22 mmol/L (ref 22–32)
Calcium: 9.3 mg/dL (ref 8.9–10.3)
Chloride: 107 mmol/L (ref 98–111)
Creatinine, Ser: 1.41 mg/dL — ABNORMAL HIGH (ref 0.44–1.00)
GFR, Estimated: 45 mL/min — ABNORMAL LOW (ref >60)
Glucose, Bld: 97 mg/dL (ref 70–99)
Potassium: 4.4 mmol/L (ref 3.5–5.1)
Sodium: 138 mmol/L (ref 135–145)

## 2021-12-31 LAB — PROTIME-INR
INR: 1.2 (ref 0.8–1.2)
Prothrombin Time: 14.8 seconds (ref 11.4–15.2)

## 2021-12-31 MED ORDER — TORSEMIDE 20 MG PO TABS: 20.0000 mg | ORAL_TABLET | ORAL | 3 refills | Status: DC

## 2021-12-31 MED ORDER — SPIRONOLACTONE 25 MG PO TABS: 25.0000 mg | ORAL_TABLET | Freq: Every day | ORAL | 3 refills | Status: DC

## 2021-12-31 NOTE — Patient Instructions (Signed)
Increase Spironolactone to 25 mg daily Take Torsemide 20 mg daily x 3 days, then take 20 mg every other day Coumadin dosing per [name] Return to [place] clinic in 1 week for labs Return to [place] clinic in 1 month for follow up with Dr Kendall Flack are cleared to have surgery whenever Dr Berline Lopes is ready to schedule you

## 2021-12-31 NOTE — Progress Notes (Signed)
LVAD INR 

## 2021-12-31 NOTE — Progress Notes (Addendum)
Patient presents for 1 month f/u up in [facility] today with her husband [name]. Reports no problems with VAD equipment or concerns with drive line.  Pt seen in the ER 12/24/21 for heavy vaginal bleeding due to known large ovarian cyst. Received 3 units PRBC. Had iron infusion 12/26/21. Discussed pt with Dr Berline Lopes. Pt started on Provera daily per Dr Berline Lopes.  Pt states that she is feeling better since starting Provera, and that vaginal bleeding stopped on Saturday. Had uterine biopsy last week with Dr Berline Lopes. She has follow up appt with her tomorrow to discuss biopsy results, and plan moving forward for medical management vs proceeding with surgery.  Denies dizziness, shortness of breath, and signs of bleeding since vaginal bleeding stopped on Saturday. Pt wt up 4 lbs and fluid is up. BP elevated today. Per Dr [name] will increase [name] to 25 mg daily, and pt to take Torsemide 20 mg daily x 3 days, then decrease to 20 mg every other day. Prescriptions sent to pt's pharmacy. Cr 1.4 today (up from 1.1.) Discussed with Dr [name]- will continue with medication changes made today, and will recheck BMET next week.   Will continue INR goal 2.0 - 2.5 at this time. If recurrent severe vaginal bleeding will readdress at that time per Dr [name]. [name] aware.  Pt has seen Endo and OB/GYN. Endo decreased Prednisone to 10 mg daily, and Methimazole to 10 mg BID at last visit. Has follow up with endocrinology scheduled in November.  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 Per Dr [name] pt is clear from a cardiac/VAD prospective, and may have surgery here at [facility] whenever Dr Berline Lopes feels surgery is appropriate.  Completed sleep study last night per Dr [name] at sleep center. Pt found out her current mask does not fit correctly, and is hopeful that a new mask will help with sleep apnea symptoms.    She is participating in cardiac rehab at [facility]. She is enjoying this.    Drive  line dressing change completed  weekly at home per [name]. Dressing change today in clinic. See documentation below. Site still not entirely incorporated and small amount of serosanguinous drainage noted today. Denies trauma. Drainage probably related to extra fluid on abdomen. Will continue weekly dressing changes with daily kit at this time. Instructed to change more frequently if drainage noted through dressing. They verbalized understanding.   Vital Signs:  Doppler Pressure: 130  Automatc BP: 126/95 (109) HR: 89 SR  SPO2: 97% on RA   Weight: 233.4 lbs w/o eqt Last weight: 229 lb w/o eqt Home weights: 222 lbs  VAD Indication: Destination Therapy d/t BMI   VAD interrogation & Equipment Management: Speed: 5800 Flow: 4.8 Power: 4.5 w    PI: 3.6   Alarms: no external power with low voltage hazard of 7/8 due to power outage at their home Events: 0-10 daily  Fixed speed 5800 Low speed limit: 5500   Primary Controller:  Replace back up battery in 27 months. Back up controller:   Replace back up battery in 30 months.   Annual Equipment Maintenance on UBC/PM was performed on 09/03/21.    I reviewed the LVAD parameters from today and compared the results to the patient's prior recorded data. LVAD interrogation was NEGATIVE for significant power changes, NEGATIVE for clinical alarms and STABLE for PI events/speed drops. No programming changes were made and pump is functioning within specified parameters. Pt is performing daily controller and  system monitor self tests along with completing weekly and monthly maintenance for LVAD equipment.   LVAD equipment check completed and is in good working order. Back-up equipment present. Charged back up battery and performed self-test on equipment.    Exit Site Care: Drive line is being maintained weekly by her husband [name]. Existing VAD dressing removed and site care performed using sterile technique. Drive line exit site cleaned with Chlora prep  applicators x 2, allowed to dry, and gauze dressing WITHOUT Silver strip applied. Exit site healing and partially incorporated, the velour is fully implanted at exit site. Small amount of serosanguinous drainage noted on gauze. Drainage thought to be related to fluid on abdomen. No redness, tenderness, foul odor or rash noted. Drive line anchor re-applied. Pt denies fever or chills. Continue weekly dressing changes without silver strip. Provided with 7 daily kits and 10 anchors for home use.     Significant Events on VAD Support:    Device: Medtronic Therapies: on VF ?240 CT 200-240 VVI: 40 Last check: 09/18/21   BP & Labs:  Doppler 130 - reflecting modified systolic   Hgb 9.1 - No S/S of bleeding. Specifically denies melena/BRBPR or nosebleeds.  LDH stable at 192 with established baseline of 250 - 450. Denies tea-colored urine. No power elevations noted on interrogation.   Plan:  Increase Spironolactone to 25 mg daily Take Torsemide 20 mg daily x 3 days, then take 20 mg every other day Coumadin dosing per [name] Return to [place] [facility] in 1 week for labs Return to [place] [facility] in 1 month for follow up with Dr Kendall Flack are cleared to have surgery whenever Dr Berline Lopes is ready to schedule you  [name] RN [place] Coordinator  Office: [phone]  24/7 Pager: [phone]     ID:  [name], DOB 18-Apr-1971, MRN [id number]    Provider location: [place] [facility] Type of Visit: Established patient    PCP:  [name], MD    Cardiologist:  [name], MD  History of Present Illness: [name] is a 51 y.o. 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  She was followed for systolic CHF initially in [place]. Apparently her EF recovered to 35-40%. However, she was admitted to [facility] 09/19/13 for worsening dyspnea and CP. Coronaries were  reportedly OK on LHC at that time at [facility]. Echo showed EF of 15-20% with four-chamber enlargement with moderate-severe TR/MR. RHC as below. Rheumatological w/u negative.  CPX in 9/16 showed near-normal functional capacity.  Repeat echo in 6/17 shows that EF remains 20%. Repeat CPX in 1/18 was submaximal but probably only mild circulatory limitation.  Echo 8/18 showed EF 25-30%, severe LV dilation.  CPX in 6/19 showed low normal functional capacity.  Echo in 10/19 showed EF 20-25%, moderate LV dilation.    Zio patch in 2/20 with 11% PVCs and NSVT, amiodarone started. Zio patch in 3/20 with 13.3% PVCs and NSVT.  She saw Dr. [name], he thought that increased PVCs were electromechanical in nature due to worsening heart failure.  Repeat Zio patch on amiodarone in 1/21 showed 6.2% PVCs.   She was admitted in 4/20 with VT in setting of hypokalemia.    CPX in 9/20 showed mild functional limitation, more due to body habitus than heart failure.  Echo in 4/21 showed EF 20% with severe LV dilation, diffuse hypokinesis, normal RV size and  systolic function.   In 8/21, she had a shock for VF in the setting of hypokalemia.   Patient was admitted in 10/21 with CVA involving left MCA territory.  This was thought to be cardioembolic but LV thrombus was not visualized and no atrial fibrillation has been seen.  Echo in 10/21 showed EF 20-25%, severe LV dilation (no LV thrombus seen), mildly decreased RV systolic function, mild-moderate MR. She was taken by IR for thrombectomy and stent placement, now on ASA 81 and ticagrelor.   She had recurrent VT in 2/22, quinidine was added by EP.  Spironolactone and Coreg were decreased due to low BP and lightheadedness.    Echo in 4/22 showed EF 25% with severe LV dilation and global hypokinesis, normal RV, mild-moderate MR.   CPX (8/22) showed peak VO2 14.3, VE/VCO2 slope 42, RER 1.09 => mild to moderate HF limitation, elevated VE/VCO2 slope is likely false from end exercise  hyperventilation. Restrictive lung physiology from obesity.   Patient was admitted in 3/23 with recurrent CHF exacerbation with low cardiac output.  She was also noted to be hyperthyroidism but amiodarone had to be continued due to VT and poorly tolerated atrial fibrillation.  She required Impella 5.5 placement and eventual HM3 LVAD placement on 09/04/21.  Post-op course complicated by atrial fibrillation, malnutrition, and deconditioning.  She went to CIR prior to discharge.   Patient developed heavy vaginal bleeding in 7/23, she was seen in the ER and required 2 units PRBCs.  She has since seen her OB/GYN and was started on Provera.   She returns for followup of CHF and LVAD.  Bleeding has resolved on Provera.  Had a recent sleep study was was nondiagnostic.  She is followed by endocrinology for hyperthyroidism, on methimazole and prednisone, titrating down prednisone.  Feels more short of breath with exertion, notes with moderate exertion.  Weight up 4 lbs.  Thinks she picked up fluid after blood transfusions.   LVAD interrogation: Please see LVAD nursing note above.   Labs: (11/09/13): Dig level 0.5, K 3.6, Creatinine 0.83, pro-BNP 622 (12/19/13): K 3.6, creatinine 0.8 (7/15): K 4.1, creatinine 0.91 (8/15): K 3.9, creatinine 0.91 (10/15): K 4, creatinine 0.93, proBNP 129 (12/15): K 4, creatinine 0.9, digoxin 0.4 (5/16): K 4, creatinine 0.86 (9/16): K 3.6, creatinine 0.86, digoxin < 0.2 (5/17): K 4.1, creatinine 0.95, digoxin 0.3 (5/18): K 3.4, creatinine 0.95, TSH elevated but free T3 and free T4 normal.  (8/18): K 3.9, creatinine 0.87, BNP 108  (11/18): K 3.5, creatinine 1.04, digoxin 0.4 (1/19): LDL 91, HDL 47 (3/19): digoxin 0.7, K 4, creatinine 0.99 (4/19): TSH normal, LDL 95 (9/19): LDL 99, HDL 45, K 4, creatinine 0.96 (10/19): K 3.6, creatinine 0.85, digoxin 0.5 (2/20): LDL 103 (3/20): K 3.9, creatinine 1.05 (4/20): K 3.9, creatinine 1.23 => 1.15, TSH 8, digoxin 0.3, LFTs  normal 6/20): K 4.3, creatinine 1.07, LFTs normal, digoxin 0.5 (7/20): K 3.8, creatinine 1.16 (8/20): digoxin < 0.2, LDL 135 (1/21): K 3.9, creatinine 1.05, LDL 171  (4/21): K 4.2, creatinine 0.95, LDL 127, TSH normal, LFTs normal (10/21): K 2.8, creatinine 1.21, LDL 130 (11/21): K 4.5, creatinine 1.3 => 1.2, LFTs normal, digoxin 1.3, TSH normal (3/22): K 4.1, creatinine 1.36 (4/22): hgb 10.8 (7/22): Transferrin 8%, digoxin 0.9, hgb 10.1, K 3.9, creatinine 1.42 (8/22): digoxin 0.4 (9/22): K 4.2, creatinine 1.32 (4/23): hgb 11, WBCs 13, K 3.9 => 3.8, creatinine 0.9 => 0.98, albumin 3.2, ALT 45, AST 31, LDH 272 => 244, TSH  0.235, free T4 1.84 (7/23): hgb 8, TSH normal  PMH: 1. OSA: Using CPAP.  2. PVCs:  - Zio patch 2/20: 11% PVCs - Zio patch 3/20: 13.3% PVCs - Zio patch 1/21: 6.2% PVCs 3. VT/VF: 8/21, in setting of hypokalemia.  2/22 VT, quinidine added.  4. 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  No LV thrombus visualized and atrial fibrillation has not been visualized.  - IR thrombectomy with stent placement in 10/21.  5. Chronic systolic CHF: Nonischemic cardiomyopathy:   - Coronary angiography 2015 at [facility]: Normal coronaries.  - Echo (8/15) with EF 20%, moderate LV dilation, diffuse hypokinesis, normal RV size with mildly decreased systolic function, mild MR.  - Echo (5/16) with EF 20%, spherical LV with diffuse hypokinesis, mild MR.  - Echo (6/17) with EF 20%, diffuse hypokinesis, moderate LV dilation, normal RV, mild to moderate MR.  - RHC (12/17): mean RA 5, PA 56/31, mean PCWP 24, CI 2, PVR 3.7 WU - Echo (8/18) with EF 25-30%, severe LV dilation, severe LAE.  - Echo (2/20) with EF 20-25%, moderate LV dilation, moderate MR.  - Echo (4/21) with EF 20-25%, severe LV dilation, mild-moderate MR, normal RV, PASP 26 mmHg.  - Echo (10/21) with EF 20-25%, severe LV dilation (no LV thrombus seen), mildly decreased RV systolic function, mild-moderate MR. - CPX in 9/20 showed mild functional  limitation, more due to body habitus than heart failure.  - CPX in 8/21: Peak VO2 11.6, VE/VCO2 32, RER 1.2.  Mild HF limitation.  - Echo (4/22): EF 25% with severe LV dilation and global hypokinesis, normal RV, mild-moderate MR.  - CPX (8/22): peak VO2 14.3, VE/VCO2 slope 42, RER 1.09 => mild to moderate HF limitation, elevated VE/VCO2 slope is likely false from end exercise hyperventilation. Restrictive lung physiology from obesity.  - HM3 LVAD placement 09/04/21.  6. PFTs (1/22): Near normal.  7. Fe deficiency anemia 8. 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  10. Left ovarian mass 11. Heavy menses    Current Outpatient Medications  Medication Sig Dispense Refill   aspirin EC 81 MG tablet Take 1 tablet (81 mg total) by mouth daily. Swallow whole. 90 tablet 3   medroxyPROGESTERone (PROVERA) 10 MG tablet Take 1 tablet (10 mg total) by mouth daily. 30 tablet 0   methimazole (TAPAZOLE) 10 MG tablet Take 1 tablet (10 mg total) by mouth 2 (two) times daily. 180 tablet 1   potassium chloride SA (KLOR-CON M) 20 MEQ tablet Take 3 tablets (60 mEq total) by mouth 2 (two) times daily. 180 tablet 11   predniSONE (DELTASONE) 10 MG tablet Take 1 tablet (10 mg total) by mouth daily with breakfast. 90 tablet 1   quiniDINE gluconate 324 MG CR tablet Take 1 tablet (324 mg total) by mouth 2 (two) times daily. 60 tablet 0   rosuvastatin (CRESTOR) 40 MG tablet Take 1 tablet (40 mg total) by mouth at bedtime. 30 tablet 0   sildenafil (REVATIO) 20 MG tablet Take 1 tablet (20 mg total) by mouth 3 (three) times daily. 90 tablet 6   warfarin (COUMADIN) 2 MG tablet Take 4 mg (2 tabs) every Monday/Wednesday/Friday and 2 mg (1 tablet) all other days or as directed by the [facility] 180 tablet 5   zinc gluconate 50 MG tablet Take 1 tablet (50 mg total) by mouth daily. 30 tablet 6   enoxaparin (LOVENOX) 40 MG/0.4ML injection Inject 0.4 mLs (40 mg total) into  the skin every 12 (twelve)  hours. (Patient not taking: Reported on 11/27/2021) 24 mL 0   spironolactone (ALDACTONE) 25 MG tablet Take 1 tablet (25 mg total) by mouth daily. 45 tablet 3   torsemide (DEMADEX) 20 MG tablet Take 1 tablet (20 mg total) by mouth every other day. 45 tablet 3   No current facility-administered medications for this encounter.    Allergies:   Patient has no known allergies.   Social History:  The patient  reports that she has never smoked. She has never used smokeless tobacco. She reports that she does not drink alcohol and does not use drugs.   Family History:  The patient's family history includes Heart disease in her maternal grandmother and mother; High blood pressure in her father; Obesity in her mother; Stroke in her father.   ROS:  Please see the history of present illness.   All other systems are personally reviewed and negative.   Exam:   BP (!) 126/95 Comment: map 109  Pulse 96 Comment: SR  Wt 105.9 kg (233 lb 6.4 oz)   BMI 41.34 kg/m  MAP 109 General: Well appearing this am. NAD.  HEENT: Normal. Neck: Supple, JVP 10-12 cm. Carotids OK.  Cardiac:  Mechanical heart sounds with LVAD hum present.  Lungs:  CTAB, normal effort.  Abdomen:  NT, ND, no HSM. No bruits or masses. +BS  LVAD exit site: Well-healed and incorporated. Dressing dry and intact. No erythema or drainage. Stabilization device present and accurately applied. Driveline dressing changed daily per sterile technique. Extremities:  Warm and dry. No cyanosis, clubbing, rash.  1+ ankle edema.  Neuro:  Alert & oriented x 3. Cranial nerves grossly intact. Moves all 4 extremities w/o difficulty. Affect pleasant    Recent Labs: 09/17/2021: B Natriuretic Peptide 279.4 09/24/2021: Magnesium 1.9 10/07/2021: ALT 45 12/26/2021: TSH 5.40 12/31/2021: BUN 12; Creatinine, Ser 1.41; Hemoglobin 9.1; Platelets 319; Potassium 4.4; Sodium 138  Personally reviewed   Wt Readings from Last 3 Encounters:  12/31/21  105.9 kg (233 lb 6.4 oz)  12/30/21 102.5 kg (226 lb)  12/26/21 105.7 kg (233 lb)    ASSESSMENT AND PLAN:   1. Chronic systolic HF: Nonischemic cardiomyopathy.  She has a Medtronic ICD. Cause for cardiomyopathy is uncertain, initially identified peri-partum (after son's birth), so cannot rule out peri-partum CMP.  On and off, she has had frequent PVCs.   RHC in 8/15 showed normal filling pressures and preserved cardiac output. Echo in 4/22 with EF 25%, severe LV dilation.  CPX (8/22) with mild-moderate HF limitation.  Echo in 3/23 with EF < 20% with severe dilation, normal RV size with mildly decreased systolic function, moderate-severe functional MR with dilated IVC. 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 Impella 5.5 placed 08/29/21.  Patient had HM3 LVAD placed 09/04/21 and speed adjusted by ramp echo up to 5800 rpm. LVAD interrogation shows stable parameters today.  Today, weight is up and she is volume overloaded by exam, NYHA class III symptoms. MAP elevated today.  - Restart torsemide 20 mg daily x 3 days then 20 mg every other day after that. BMET today and in 1 week.  - Increase spironolactone to 25 mg daily with elevated MAP.   - She should continue ASA 81 mg daily with history of CVA.  - Continue sildenafil 20 mg tid  - Warfarin for VAD, goal INR 2-2.5.   2. Hyperthyroidism: Has  history of hypothroidism thought to be associated with amiodarone.  She had been on Levoxyl at home prior to 3/23 admission, noted to be hyperthyroid in 3/23.  This likely contributes to her CHF. She had recurrent VT and poorly tolerated AF which made it difficult for [name] to stop amiodarone. Initially treated w/ IV Solumedrol  -> transitioned to prednisone and methimazole.  Endocrinology following, prednisone being titrated down.  She is off amiodarone.  - Management per  endocrinology.   3. PVCs/VT: EP consulted given complicated situation with hyperthyroidism and VT.  - Continue quinidine.  - Now off amiodarone.  4. CVA: CVA in 10/21, had thrombectomy with stent placement.  No LV thrombus noted and no atrial fibrillation noted.  - Would continue ASA 81 daily.   5. Anemia: Post-op, improved.  6. CKD stage 3: Creatinine recently improved.   - BMET today.  7. Mitral regurgitation: Moderate-severe functional MR pre-LVAD.  Unlikely to be Mitraclip candidate with severely dilated LV (>7 cm). Hopefully will improve with LVAD.  - Will get followup echo in next couple of months.  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  Unable to get MRI with ICD. Seen by GYN. Felt to be most likely non-neoplastic. Tumor markers within normal range. Possible low grade malignancy, could also be benign.  No ascites noted on abdominal CT and no mention of abdominal spread.  She was seen by OB/gyn, best course likely would be surgery to remove ovarian mass. - She is stable post-LVAD, can have surgery for mass removal whenever surgeon is ready.  Will need to be done at [facility] and coordinated with LVAD staff to be present in [place].   9. Elevated WBCs: Thought to be due to prednisone. 10. Malnutrition: This is improving.   11. Atrial fibrillation: Tolerates poorly.   - Watch closely off amiodarone.  - Make sure K is normal.   12. Deconditioning: Refer again to cardiac rehab at [facility], we referred at last appointment but she has not heard anything yet.  13. OSA: Sleep study was nondiagnostic, needs to be repeated.  14. Heavy menses: In ER 7/23 with anemia in setting of very heavy menses requiring 2 units PRBCs.  Bleeding has resolved with Provera.  - Has had feraheme.  - Ongoing management per GYN.   Followup in 1 month.   [name], MD  12/31/2021  [place] [address] [facility] [place] [address] [phone] (office) [phone] (fax)

## 2021-12-31 NOTE — Procedures (Signed)
    Patient Name: [name], [name] Date:12/30/2021 Gender: Female D.O.B: 1971-04-03 Age (years): 56 Referring Provider: Fransico Him MD, ABSM Height (inches): 63 Interpreting Physician: Fransico Him MD, ABSM Weight (lbs): 226 RPSGT: Gwenyth Allegra BMI: 40 MRN: [id number] Neck Size: 15.00  CLINICAL INFORMATION Sleep Study Type: NPSG  Indication for sleep study: OSA  SLEEP STUDY TECHNIQUE As per the AASM Manual for the Scoring of Sleep and Associated Events v2.3 (April 2016) with a hypopnea requiring 4% desaturations.  The channels recorded and monitored were frontal, central and occipital EEG, electrooculogram (EOG), submentalis EMG (chin), nasal and oral airflow, thoracic and abdominal wall motion, anterior tibialis EMG, snore microphone, electrocardiogram, and pulse oximetry.  MEDICATIONS Medications self-administered by patient taken the night of the study : N/A  SLEEP ARCHITECTURE The study was initiated at 11:06:52 PM and ended at 5:10:10 AM.  Sleep onset time was 18.2 minutes and the sleep efficiency was 64.4%. The total sleep time was 234 minutes.  Stage REM latency was 130.0 minutes.  The patient spent 9.4%% of the night in stage N1 sleep, 72.4% in stage N2 sleep, 0.0% in stage N3 and 18.2% in REM.  Alpha intrusion was absent.  Supine sleep was 34.02%.  RESPIRATORY PARAMETERS The overall apnea/hypopnea index (AHI) was 0.3 per hour. There were 0 total apneas, including 0 obstructive, 0 central and 0 mixed apneas. There were 1 hypopneas and 7 RERAs.  The AHI during Stage REM sleep was 1.4 per hour.  AHI while supine was 0.8 per hour.  The mean oxygen saturation was 98.4%. The minimum SpO2 during sleep was 94.0%.  moderate snoring was noted during this study.  CARDIAC DATA The 2 lead EKG demonstrated sinus rhythm. The mean heart rate was 84.4 beats per minute. Other EKG findings include: PVCs.  LEG MOVEMENT DATA The total PLMS were 0 with a resulting PLMS  index of 0.0. Associated arousal with leg movement index was 9.2 .  IMPRESSIONS - No significant obstructive sleep apnea occurred during this study (AHI = 0.3/h). - Severe oxygen desaturation was noted during this study (Min O2 = 94.0%). - The patient snored with moderate snoring volume. - No cardiac abnormalities were noted during this study. - Clinically significant periodic limb movements did not occur during sleep. Associated arousals were significant.  DIAGNOSIS - Nondiagnostic study due to inadequate sleep time.   RECOMMENDATIONS - Repeat PSG with sleep aide. - Avoid alcohol, sedatives and other CNS depressants that may worsen sleep apnea and disrupt normal sleep architecture. - Sleep hygiene should be reviewed to assess factors that may improve sleep quality. - Weight management and regular exercise should be initiated or continued if appropriate.  [Electronically signed] 12/31/2021 08:55 AM  Fransico Him MD, ABSM Diplomate, American Board of Sleep Medicine

## 2022-01-01 ENCOUNTER — Encounter: Payer: Self-pay | Admitting: Gynecologic Oncology

## 2022-01-01 ENCOUNTER — Encounter: Payer: Self-pay | Admitting: *Deleted

## 2022-01-01 ENCOUNTER — Inpatient Hospital Stay: Payer: BC Managed Care – PPO | Attending: Gynecologic Oncology | Admitting: Gynecologic Oncology

## 2022-01-01 DIAGNOSIS — N939 Abnormal uterine and vaginal bleeding, unspecified: Secondary | ICD-10-CM

## 2022-01-01 DIAGNOSIS — I5042 Chronic combined systolic (congestive) and diastolic (congestive) heart failure: Secondary | ICD-10-CM | POA: Diagnosis not present

## 2022-01-01 DIAGNOSIS — Z95811 Presence of heart assist device: Secondary | ICD-10-CM

## 2022-01-01 DIAGNOSIS — I5022 Chronic systolic (congestive) heart failure: Secondary | ICD-10-CM

## 2022-01-01 DIAGNOSIS — N9489 Other specified conditions associated with female genital organs and menstrual cycle: Secondary | ICD-10-CM

## 2022-01-01 NOTE — Progress Notes (Signed)
Gynecologic Oncology Telehealth Note: Gyn-Onc  I connected with [name] on 01/01/22 at  4:40 PM EDT by telephone and verified that I am speaking with the correct person using two identifiers.  I discussed the limitations, risks, security and privacy concerns of performing an evaluation and management service by telemedicine and the availability of in-person appointments. I also discussed with the patient that there may be a patient responsible charge related to this service. The patient expressed understanding and agreed to proceed.  Other persons participating in the visit and their role in the encounter: none.  Patient's location: home Provider's location: [facility]  Reason for Visit: follow-up   Treatment History: The patient was recently admitted from 3/6 to 3/31 with low output heart failure in the setting of hyperthyroidism.  Ultimately underwent placement of an LVAD on 3/14.  She was discharged to acute inpatient rehab at the end of March and discharged from rehab on 4/6. During her hospital stay, CT scan was performed with incidental finding of a pelvic mass.  Further work-up and treatment was deferred in the setting of her acute cardiac issues.   CT of the abdomen and pelvis on 08/26/2021 with incidental finding of a 11.7 x 4.3 x 7.4 cm low-attenuation mass within the left adnexa.  Uterus and right ovary unremarkable in appearance.  No significant volume of ascites, no adenopathy.  The findings included cardiomegaly, mild interstitial pulmonary edema in the lung bases, biliary sludge, and a 5 mm nonobstructive calculus in the lower pole of the collecting system of the right kidney. Pelvic ultrasound exam on 08/27/2021 shows a 12 x 9.4 x 4.8 cm complex multiseptated cystic mass replacing the left adnexa.  Right ovary normal in appearance measuring up to 2.9 cm. Tumor markers obtained on 08/26/2021 were notable for a normal CEA of 1.1 and CA-125 of 17.3.  Pelvic ultrasound on 11/25/2021: Uterus  measures 11.1 x 4.6 x 7.5 cm.  Endometrial lining is 7 mm.  Right ovary is normal-appearing measuring up to 3.4 cm.  Left ovary not visualized but there is a complex cystic mass within the left adnexa measuring 11.9 x 7.8 x 6.1 cm containing multiple septations, some of which are irregular and show mild degree of blood flow peripherally.  Questionable mural nodule.  Interval History: Patient recently presented to the emergency department on 7 4 after feeling dizzy and lightheaded in the setting of heavy vaginal bleeding.  Hemoglobin was noted to be 7 and the patient was transfused 2 units of pRBCs.  Hemoglobin at the time of discharge from the emergency department was 8.5. Endometrial biopsy performed on 7/6 showed secretory endometrium, no hyperplasia or malignancy.  Started provera, bleeding almost stopped, just brown discharge.  Doing well. Denies pelvic pain.  Past Medical/Surgical History: Past Medical History:  Diagnosis Date   Automatic implantable cardioverter-defibrillator in situ    Chronic CHF (congestive heart failure) ([place])    a. EF 15-20% b. RHC (09/2013) RA 14, RV 57/22, PA 64/36 (48), PCWP 18, FIck CO/CI 3.7/1.6, PVR 8.1 WU, PA sat 47%    History of hypothyroidism    History of stomach ulcers    Hyperthyroidism    Hypotension    Morbid obesity ([place])    Myocardial infarction ([place]) 08/2013   Nocturnal dyspnea    Nonischemic cardiomyopathy ([place])    Sinus tachycardia    Sleep apnea    Snoring-prob OSA 09/04/2011   [employer] ICD lead RECALL  6949    Stroke ([place]) 2021  some residual right-sided weakness   UARS (upper airway resistance syndrome) 09/04/2011   HST 12/2013:  AHI 4/hr (numerous episodes of airflow reduction that did not have concomitant desaturation)     Past Surgical History:  Procedure Laterality Date   BREATH TEK H PYLORI N/A 11/09/2014   Procedure: BREATH TEK H PYLORI;  Surgeon: Greer Pickerel, MD;  Location: [facility];  Service: General;  Laterality:  N/A;   CARDIAC CATHETERIZATION  ~ 2006; 09/2013   CARDIAC CATHETERIZATION N/A 05/23/2016   Procedure: Right Heart Cath;  Surgeon: Larey Dresser, MD;  Location: [facility] [facility];  Service: Cardiovascular;  Laterality: N/A;   CARDIAC DEFIBRILLATOR PLACEMENT  2006; 12/26/2013   Medtronic [id number]; 12-2013 ICD gen change and RV lead revision with new 6935 RV lead by Dr [name]   CENTRAL LINE INSERTION  08/28/2021   Procedure: CENTRAL LINE INSERTION;  Surgeon: Larey Dresser, MD;  Location: [place] [facility];  Service: Cardiovascular;;   CESAREAN SECTION  1999   IMPLANTABLE CARDIOVERTER DEFIBRILLATOR GENERATOR CHANGE N/A 12/26/2013   Procedure: IMPLANTABLE CARDIOVERTER DEFIBRILLATOR GENERATOR CHANGE;  Surgeon: Deboraha Sprang, MD;  Location: [facility] CATH LAB;  Service: Cardiovascular;  Laterality: N/A;   INSERTION OF IMPLANTABLE LEFT VENTRICULAR ASSIST DEVICE N/A 09/03/2021   Procedure: INSERTION OF IMPLANTABLE LEFT VENTRICULAR ASSIST DEVICE/ Heartmate 3;  Surgeon: Dahlia Byes, MD;  Location: [place];  Service: Open Heart Surgery;  Laterality: N/A;   IR CT HEAD LTD  04/17/2020   IR CT HEAD LTD  04/17/2020   IR INTRA CRAN STENT  04/17/2020   IR PERCUTANEOUS ART THROMBECTOMY/INFUSION INTRACRANIAL INC DIAG ANGIO  04/17/2020   IR RADIOLOGIST EVAL & MGMT  06/05/2020   LEAD REVISION N/A 12/26/2013   Procedure: LEAD REVISION;  Surgeon: Deboraha Sprang, MD;  Location: [facility] CATH LAB;  Service: Cardiovascular;  Laterality: N/A;   PLACEMENT OF IMPELLA LEFT VENTRICULAR ASSIST DEVICE Right 08/29/2021   Procedure: PLACEMENT OF IMPELLA 5.5 LEFT VENTRICULAR ASSIST DEVICE;  Surgeon: Gaye Pollack, MD;  Location: [place];  Service: Open Heart Surgery;  Laterality: Right;   RADIOLOGY WITH ANESTHESIA N/A 04/17/2020   Procedure: Code Stroke;  Surgeon: Luanne Bras, MD;  Location: [place];  Service: Radiology;  Laterality: N/A;   REMOVAL OF IMPELLA LEFT VENTRICULAR ASSIST DEVICE N/A 09/03/2021   Procedure: REMOVAL OF  IMPELLA LEFT VENTRICULAR ASSIST DEVICE;  Surgeon: Dahlia Byes, MD;  Location: [place];  Service: Open Heart Surgery;  Laterality: N/A;   RIGHT HEART CATH N/A 08/28/2021   Procedure: RIGHT HEART CATH;  Surgeon: Larey Dresser, MD;  Location: [place] [facility];  Service: Cardiovascular;  Laterality: N/A;   RIGHT HEART CATHETERIZATION N/A 02/15/2014   Procedure: RIGHT HEART CATH;  Surgeon: Larey Dresser, MD;  Location: [facility] CATH LAB;  Service: Cardiovascular;  Laterality: N/A;   TEE WITHOUT CARDIOVERSION N/A 08/29/2021   Procedure: TRANSESOPHAGEAL ECHOCARDIOGRAM (TEE);  Surgeon: Gaye Pollack, MD;  Location: [place];  Service: Open Heart Surgery;  Laterality: N/A;   TEE WITHOUT CARDIOVERSION N/A 09/03/2021   Procedure: TRANSESOPHAGEAL ECHOCARDIOGRAM (TEE);  Surgeon: Dahlia Byes, MD;  Location: [place];  Service: Open Heart Surgery;  Laterality: N/A;   TUBAL LIGATION  1999    Family History  Problem Relation Age of Onset   Heart disease Mother    Obesity Mother    High blood pressure Father    Stroke Father    Heart disease Maternal Grandmother    Colon cancer Neg Hx  Breast cancer Neg Hx    Ovarian cancer Neg Hx    Endometrial cancer Neg Hx    Pancreatic cancer Neg Hx    Prostate cancer Neg Hx     Social History   Socioeconomic History   Marital status: Married    Spouse name: Desaray Marschner   Number of children: 2   Years of education: Not on file   Highest education level: Not on file  Occupational History   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  Tobacco Use   Smoking status: Never   Smokeless tobacco: Never  Vaping Use   Vaping Use: Never used  Substance and Sexual Activity   Alcohol use: No   Drug use: No   Sexual activity: Not Currently  Other Topics Concern   Not on file  Social History Narrative   [place] sometimes   Right handed   Social Determinants of Health   Financial Resource Strain: Low Risk  (08/28/2021)   Overall Financial Resource Strain  (CARDIA)    Difficulty of Paying Living Expenses: Not very hard  Food Insecurity: No Food Insecurity (08/28/2021)   Hunger Vital Sign    Worried About Running Out of Food in the Last Year: Never true    Ran Out of Food in the Last Year: Never true  Transportation Needs: No Transportation Needs (08/28/2021)   PRAPARE - Hydrologist (Medical): No    Lack of Transportation (Non-Medical): No  Physical Activity: Not on file  Stress: Not on file  Social Connections: Not on file    Current Medications:  Current Outpatient Medications:    aspirin EC 81 MG tablet, Take 1 tablet (81 mg total) by mouth daily. Swallow whole., Disp: 90 tablet, Rfl: 3   enoxaparin (LOVENOX) 40 MG/0.4ML injection, Inject 0.4 mLs (40 mg total) into the skin every 12 (twelve) hours. (Patient not taking: Reported on 11/27/2021), Disp: 24 mL, Rfl: 0   medroxyPROGESTERone (PROVERA) 10 MG tablet, Take 1 tablet (10 mg total) by mouth daily., Disp: 30 tablet, Rfl: 0   methimazole (TAPAZOLE) 10 MG tablet, Take 1 tablet (10 mg total) by mouth 2 (two) times daily., Disp: 180 tablet, Rfl: 1   potassium chloride SA (KLOR-CON M) 20 MEQ tablet, Take 3 tablets (60 mEq total) by mouth 2 (two) times daily., Disp: 180 tablet, Rfl: 11   predniSONE (DELTASONE) 10 MG tablet, Take 1 tablet (10 mg total) by mouth daily with breakfast., Disp: 90 tablet, Rfl: 1   quiniDINE gluconate 324 MG CR tablet, Take 1 tablet (324 mg total) by mouth 2 (two) times daily., Disp: 60 tablet, Rfl: 0   rosuvastatin (CRESTOR) 40 MG tablet, Take 1 tablet (40 mg total) by mouth at bedtime., Disp: 30 tablet, Rfl: 0   sildenafil (REVATIO) 20 MG tablet, Take 1 tablet (20 mg total) by mouth 3 (three) times daily., Disp: 90 tablet, Rfl: 6   spironolactone (ALDACTONE) 25 MG tablet, Take 1 tablet (25 mg total) by mouth daily., Disp: 45 tablet, Rfl: 3   torsemide (DEMADEX) 20 MG tablet, Take 1 tablet (20 mg total) by mouth every other day., Disp: 45  tablet, Rfl: 3   warfarin (COUMADIN) 2 MG tablet, Take 4 mg (2 tabs) every Monday/Wednesday/Friday and 2 mg (1 tablet) all other days or as directed by the [facility], Disp: 180 tablet, Rfl: 5   zinc gluconate 50 MG tablet, Take 1 tablet (50 mg total) by mouth daily., Disp:  30 tablet, Rfl: 6  Review of Symptoms: Pertinent positives as per HPI.  Physical Exam: There were no vitals taken for this visit. Deferred given limitations of phone visit.  Laboratory & Radiologic Studies: See recent endometrial biopsy above  Assessment & Plan: [name] is a 51 y.o. woman with significant medical comorbidities found to have a complex adnexal mass.  Discussed endometrial biopsy results.  Heavy vaginal bleeding likely in the setting of Coumadin given no anatomical reason for heavy bleeding and biopsy showing no hyperplasia or malignancy.  Patient is now started on Provera with almost complete cessation of her bleeding.  She is doing very well from a cardiac standpoint and has cleared to move forward with surgery now for her adnexal mass.  We will plan to reschedule her pelvic ultrasound for early August and tentatively pick a date for surgery in mid August.  I suspect that this will need to be at [facility] if possible to coordinate bridging of her Coumadin and LVAD care.  Upon my office work on rescheduling the ultrasound and seeing if there is robotic time for [name] to use at [facility]  In terms of the plan for surgery, if the adnexal mass represents a malignancy, then we would proceed with removal of both fallopian tubes and ovaries, total hysterectomy, and staging.  If the mass is not a malignancy, then total hysterectomy would not be indicated from a cancer standpoint.  While I know that her bleeding is problematic in terms of her LVAD, if we successful in stopping or significantly decreasing her bleeding with the use of progesterone, then hysterectomy would not be indicated.  Total  hysterectomy if benign adnexal mass would increase the complexity of surgery, duration, and risks associated.  We discussed the option, if she continues not to have any significant bleeding on the oral Provera, placing a Mirena IUD at the time of surgery.  If however, progesterone is insufficient to control her bleeding, weakening move forward with total hysterectomy at the time of surgery, regardless of her adnexal pathology.  I discussed the assessment and treatment plan with the patient. The patient was provided with an opportunity to ask questions and all were answered. The patient agreed with the plan and demonstrated an understanding of the instructions.   The patient was advised to call back or see an in-person evaluation if the symptoms worsen or if the condition fails to improve as anticipated.   12 minutes of total time was spent for this patient encounter, including preparation, face-to-face counseling with the patient and coordination of care, and documentation of the encounter.   Jeral Pinch, MD  Division of Gynecologic Oncology  Department of Obstetrics and Gynecology  [facility]

## 2022-01-01 NOTE — Progress Notes (Signed)
Daily Session Note  Patient Details  Name: [name] MRN: [id number] Date of Birth: Jul 17, 1970 Referring Provider:   Flowsheet Row Cardiac Rehab from 11/13/2021 in [facility]  Referring Provider [name] MD       Encounter Date: 01/01/2022  Check In:  Session Check In - 01/01/22 0950       Check-In   Supervising physician immediately available to respond to emergencies See telemetry face sheet for immediately available ER MD    Location [facility]-Cardiac & Pulmonary Rehab    Staff Present Justin Mend, RCP,RRT,BSRT;Makenah Karas, MPA, RN;Melissa [place], RDN, LDN    Virtual Visit No    Medication changes reported     No    Fall or balance concerns reported    No    Warm-up and Cool-down Performed on first and last piece of equipment    Resistance Training Performed Yes    VAD Patient? Yes    PAD/SET Patient? No      VAD patient   Has back up controller? Yes    Has spare charged batteries? Yes    Has battery cables? Yes    Has compatible battery clips? Yes      Pain Assessment   Currently in Pain? No/denies                Social History   Tobacco Use  Smoking Status Never  Smokeless Tobacco Never    Goals Met:  Independence with exercise equipment Exercise tolerated well No report of concerns or symptoms today Strength training completed today  Goals Unmet:  Not Applicable  Comments: Pt able to follow exercise prescription today without complaint.  Will continue to monitor for progression.    Dr. Emily Filbert is Medical Director for [place].  Dr. Ottie Glazier is Medical Director for [facility] Pulmonary Rehabilitation.

## 2022-01-01 NOTE — Progress Notes (Signed)
Cardiac Individual Treatment Plan  Patient Details  Name: [name] MRN: [id number] Date of Birth: 11/05/1970 Referring Provider:   Flowsheet Row Cardiac Rehab from 11/13/2021 in [facility] Cardiac and Pulmonary Rehab  Referring Provider [name] MD       Initial Encounter Date:  Flowsheet Row Cardiac Rehab from 11/13/2021 in [facility] Cardiac and Pulmonary Rehab  Date 11/13/21       Visit Diagnosis: LVAD (left ventricular assist device) present ([place])  Chronic combined systolic and diastolic heart failure ([place])  Patient's Home Medications on Admission:  Current Outpatient Medications:    aspirin EC 81 MG tablet, Take 1 tablet (81 mg total) by mouth daily. Swallow whole., Disp: 90 tablet, Rfl: 3   enoxaparin (LOVENOX) 40 MG/0.4ML injection, Inject 0.4 mLs (40 mg total) into the skin every 12 (twelve) hours. (Patient not taking: Reported on 11/27/2021), Disp: 24 mL, Rfl: 0   medroxyPROGESTERone (PROVERA) 10 MG tablet, Take 1 tablet (10 mg total) by mouth daily., Disp: 30 tablet, Rfl: 0   methimazole (TAPAZOLE) 10 MG tablet, Take 1 tablet (10 mg total) by mouth 2 (two) times daily., Disp: 180 tablet, Rfl: 1   potassium chloride SA (KLOR-CON M) 20 MEQ tablet, Take 3 tablets (60 mEq total) by mouth 2 (two) times daily., Disp: 180 tablet, Rfl: 11   predniSONE (DELTASONE) 10 MG tablet, Take 1 tablet (10 mg total) by mouth daily with breakfast., Disp: 90 tablet, Rfl: 1   quiniDINE gluconate 324 MG CR tablet, Take 1 tablet (324 mg total) by mouth 2 (two) times daily., Disp: 60 tablet, Rfl: 0   rosuvastatin (CRESTOR) 40 MG tablet, Take 1 tablet (40 mg total) by mouth at bedtime., Disp: 30 tablet, Rfl: 0   sildenafil (REVATIO) 20 MG tablet, Take 1 tablet (20 mg total) by mouth 3 (three) times daily., Disp: 90 tablet, Rfl: 6   spironolactone (ALDACTONE) 25 MG tablet, Take 1 tablet (25 mg total) by mouth daily., Disp: 45 tablet, Rfl: 3   torsemide (DEMADEX) 20 MG tablet, Take 1 tablet (20 mg total)  by mouth every other day., Disp: 45 tablet, Rfl: 3   warfarin (COUMADIN) 2 MG tablet, Take 4 mg (2 tabs) every Monday/Wednesday/Friday and 2 mg (1 tablet) all other days or as directed by the [facility], Disp: 180 tablet, Rfl: 5   zinc gluconate 50 MG tablet, Take 1 tablet (50 mg total) by mouth daily., Disp: 30 tablet, Rfl: 6  Past Medical History: Past Medical History:  Diagnosis Date   Automatic implantable cardioverter-defibrillator in situ    Chronic CHF (congestive heart failure) (HCC)    a. EF 15-20% b. RHC (09/2013) RA 14, RV 57/22, PA 64/36 (48), PCWP 18, FIck CO/CI 3.7/1.6, PVR 8.1 WU, PA sat 47%    History of hypothyroidism    History of stomach ulcers    Hyperthyroidism    Hypotension    Morbid obesity ([place])    Myocardial infarction ([place]) 08/2013   Nocturnal dyspnea    Nonischemic cardiomyopathy ([place])    Sinus tachycardia    Sleep apnea    Snoring-prob OSA 09/04/2011   [employer] ICD lead RECALL  6949    Stroke ([place]) 2021   some residual right-sided weakness   UARS (upper airway resistance syndrome) 09/04/2011   HST 12/2013:  AHI 4/hr (numerous episodes of airflow reduction that did not have concomitant desaturation)     Tobacco Use: Social History   Tobacco Use  Smoking Status Never  Smokeless  Tobacco Never    Labs: Review Flowsheet  More data exists      Latest Ref Rng & Units 09/17/2021 09/18/2021 09/19/2021 09/20/2021 12/24/2021  Labs for ITP Cardiac and Pulmonary Rehab  TCO2 22 - 32 mmol/L - - - - 21   O2 Saturation % 70.5  95.4  63.7  67.5  66.6  -     Exercise Target Goals: Exercise Program Goal: Individual exercise prescription set using results from initial 6 min walk test and THRR while considering  patient's activity barriers and safety.   Exercise Prescription Goal: Initial exercise prescription builds to 30-45 minutes a day of aerobic activity, 2-3 days per week.  Home exercise guidelines will be given to patient during  program as part of exercise prescription that the participant will acknowledge.   Education: Aerobic Exercise: - Group verbal and visual presentation on the components of exercise prescription. Introduces F.I.T.T principle from ACSM for exercise prescriptions.  Reviews F.I.T.T. principles of aerobic exercise including progression. Written material given at graduation. Flowsheet Row Cardiac Rehab from 10/17/2020 in [facility] Cardiac and Pulmonary Rehab  Date 08/08/20  Educator [facility]  Instruction Review Code 1- Verbalizes Understanding       Education: Resistance Exercise: - Group verbal and visual presentation on the components of exercise prescription. Introduces F.I.T.T principle from ACSM for exercise prescriptions  Reviews F.I.T.T. principles of resistance exercise including progression. Written material given at graduation. Flowsheet Row Cardiac Rehab from 10/17/2020 in [facility] Cardiac and Pulmonary Rehab  Date 10/17/20  Educator [facility]  Instruction Review Code 1- Verbalizes Understanding        Education: Exercise & Equipment Safety: - Individual verbal instruction and demonstration of equipment use and safety with use of the equipment. Flowsheet Row Cardiac Rehab from 11/13/2021 in [facility] Cardiac and Pulmonary Rehab  Education need identified 11/13/21  Date 11/13/21  Educator [place]  Instruction Review Code 1- Verbalizes Understanding       Education: Exercise Physiology & General Exercise Guidelines: - Group verbal and written instruction with models to review the exercise physiology of the cardiovascular system and associated critical values. Provides general exercise guidelines with specific guidelines to those with heart or lung disease.  Flowsheet Row Cardiac Rehab from 10/17/2020 in [facility] Cardiac and Pulmonary Rehab  Date 10/03/20  Educator [facility]  Instruction Review Code 1- Verbalizes Understanding       Education: Flexibility, Balance, Mind/Body Relaxation: - Group verbal and visual  presentation with interactive activity on the components of exercise prescription. Introduces F.I.T.T principle from ACSM for exercise prescriptions. Reviews F.I.T.T. principles of flexibility and balance exercise training including progression. Also discusses the mind body connection.  Reviews various relaxation techniques to help reduce and manage stress (i.e. Deep breathing, progressive muscle relaxation, and visualization). Balance handout provided to take home. Written material given at graduation. Flowsheet Row Cardiac Rehab from 10/17/2020 in [facility] Cardiac and Pulmonary Rehab  Date 08/22/20  Educator [facility]  Instruction Review Code 1- Verbalizes Understanding       Activity Barriers & Risk Stratification:  Activity Barriers & Cardiac Risk Stratification - 11/13/21 1234       Activity Barriers & Cardiac Risk Stratification   Activity Barriers Deconditioning;Muscular Weakness;Shortness of Breath;Decreased Ventricular Function;Other (comment)    Comments LVAD    Cardiac Risk Stratification High             6 Minute Walk:  6 Minute Walk     Row Name 11/13/21 1245         6  Minute Walk   Phase Initial     Distance 985 feet     Walk Time 6 minutes     # of Rest Breaks 0     MPH 1.86     METS 2.68     RPE 10     Perceived Dyspnea  1     VO2 Peak 9.41     Symptoms Yes (comment)     Comments Slight SOB, unsteadiness with balance     Resting HR 81 bpm     Resting BP 103/64  automated BP cuff     Resting Oxygen Saturation  97 %     Exercise Oxygen Saturation  during 6 min walk 96 %     Max Ex. HR 105 bpm     Max Ex. BP 112/83  automated BP cuff     2 Minute Post BP 85/74  automated BP cuff; On entresto and denied dizziness/lightheadedness              Oxygen Initial Assessment:   Oxygen Re-Evaluation:   Oxygen Discharge (Final Oxygen Re-Evaluation):   Initial Exercise Prescription:  Initial Exercise Prescription - 11/13/21 1200       Date of Initial  Exercise RX and Referring Provider   Date 11/13/21    Referring Provider [name] MD      Oxygen   Maintain Oxygen Saturation 88% or higher      Recumbant Bike   Level 1    RPM 60    Watts 22    Minutes 15    METs 2.6      NuStep   Level 1    SPM 80    Minutes 15    METs 2.6      REL-XR   Level 1    Speed 50    Minutes 15    METs 2.6      Track   Laps 21    Minutes 15    METs 2.14      Prescription Details   Frequency (times per week) 3    Duration Progress to 30 minutes of continuous aerobic without signs/symptoms of physical distress      Intensity   THRR 40-80% of Max Heartrate 116- 152    Ratings of Perceived Exertion 11-13    Perceived Dyspnea 0-4      Progression   Progression Continue to progress workloads to maintain intensity without signs/symptoms of physical distress.      Resistance Training   Training Prescription Yes    Weight 3 lb    Reps 10-15             Perform Capillary Blood Glucose checks as needed.  Exercise Prescription Changes:   Exercise Prescription Changes     Row Name 11/13/21 1200 11/20/21 1400 12/02/21 1600 12/16/21 0800 12/30/21 0900     Response to Exercise   Blood Pressure (Admit) 103/64  automated BP cuff '93/81 91/56 93/80 '$ 103/57   Blood Pressure (Exercise) 112/83  automated BP cuff --  76 Doppler 107/39 114/82 --   Blood Pressure (Exit) 85/74  automated BP cuff; On entresto and denied dizziness/lightheadedness --  72 Doppler 116/83 114/82 78/65   Heart Rate (Admit) 71 bpm 91 bpm 103 bpm 92 bpm 100 bpm   Heart Rate (Exercise) 105 bpm 116 bpm 113 bpm 113 bpm 118 bpm   Heart Rate (Exit) 80 bpm 98 bpm 90 bpm 83 bpm 100 bpm   Oxygen Saturation (Admit)  97 % -- -- -- --   Oxygen Saturation (Exercise) 96 % -- -- -- --   Oxygen Saturation (Exit) 97 % -- -- -- --   Rating of Perceived Exertion (Exercise) '10 13 13 14 13   '$ Perceived Dyspnea (Exercise) 1 -- -- -- --   Symptoms Slight SOB, unsteady gait -- SOB none  none   Comments walk test results Today was her first day at Rehab -- -- --   Duration -- -- Continue with 30 min of aerobic exercise without signs/symptoms of physical distress. Continue with 30 min of aerobic exercise without signs/symptoms of physical distress. Continue with 30 min of aerobic exercise without signs/symptoms of physical distress.   Intensity -- -- THRR unchanged THRR unchanged THRR unchanged     Progression   Progression -- -- Continue to progress workloads to maintain intensity without signs/symptoms of physical distress. Continue to progress workloads to maintain intensity without signs/symptoms of physical distress. Continue to progress workloads to maintain intensity without signs/symptoms of physical distress.   Average METs -- -- 2.07 2.47 2.72     Resistance Training   Training Prescription -- -- Yes Yes Yes   Weight -- -- 3 lb 3 lb 3 lb   Reps -- -- 10-15 10-15 10-15     Interval Training   Interval Training -- -- No No No     Treadmill   MPH -- -- -- 1.5 1.7   Grade -- -- -- 0 2   Minutes -- -- -- 15 15   METs -- -- -- 2.15 2.77     Recumbant Bike   Level -- -- 1.'5 1 2   '$ Watts -- -- '16 20 19   '$ Minutes -- -- '15 15 15   '$ METs -- -- 2.45 2.59 2.6     NuStep   Level -- -- 1 5 --   Minutes -- -- 15 15 --   METs -- -- 2.5 2.8 --     REL-XR   Level -- -- '1 2 2   '$ Minutes -- -- '15 15 15   '$ METs -- -- 1.4 3.1 3.1     T5 Nustep   Level -- -- 3 -- --   Minutes -- -- 15 -- --   METs -- -- 1.9 -- --     Track   Laps -- -- 20 -- --   Minutes -- -- 15 -- --   METs -- -- 2.09 -- --     Home Exercise Plan   Plans to continue exercise at -- -- -- -- Home (comment)  walking, treadmill, staff videos   Frequency -- -- -- -- Add 2 additional days to program exercise sessions.   Initial Home Exercises Provided -- -- -- -- 12/18/21     Oxygen   Maintain Oxygen Saturation -- -- 88% or higher 88% or higher 88% or higher            Exercise Comments:    Exercise Comments     Row Name 11/20/21 1002           Exercise Comments First full day of exercise!  Patient was oriented to gym and equipment including functions, settings, policies, and procedures.  Patient's individual exercise prescription and treatment plan were reviewed.  All starting workloads were established based on the results of the 6 minute walk test done at initial orientation visit.  The plan for exercise progression was also introduced and progression will be customized  based on patient's performance and goals.                Exercise Goals and Review:   Exercise Goals     Row Name 11/13/21 1258             Exercise Goals   Increase Physical Activity Yes       Intervention Provide advice, education, support and counseling about physical activity/exercise needs.;Develop an individualized exercise prescription for aerobic and resistive training based on initial evaluation findings, risk stratification, comorbidities and participant's personal goals.       Expected Outcomes Short Term: Attend rehab on a regular basis to increase amount of physical activity.;Long Term: Add in home exercise to make exercise part of routine and to increase amount of physical activity.;Long Term: Exercising regularly at least 3-5 days a week.       Increase Strength and Stamina Yes       Intervention Provide advice, education, support and counseling about physical activity/exercise needs.;Develop an individualized exercise prescription for aerobic and resistive training based on initial evaluation findings, risk stratification, comorbidities and participant's personal goals.       Expected Outcomes Short Term: Increase workloads from initial exercise prescription for resistance, speed, and METs.;Short Term: Perform resistance training exercises routinely during rehab and add in resistance training at home;Long Term: Improve cardiorespiratory fitness, muscular endurance and strength as  measured by increased METs and functional capacity (6MWT)       Able to understand and use rate of perceived exertion (RPE) scale Yes       Intervention Provide education and explanation on how to use RPE scale       Expected Outcomes Short Term: Able to use RPE daily in rehab to express subjective intensity level;Long Term:  Able to use RPE to guide intensity level when exercising independently       Able to understand and use Dyspnea scale Yes       Intervention Provide education and explanation on how to use Dyspnea scale       Expected Outcomes Short Term: Able to use Dyspnea scale daily in rehab to express subjective sense of shortness of breath during exertion;Long Term: Able to use Dyspnea scale to guide intensity level when exercising independently       Knowledge and understanding of Target Heart Rate Range (THRR) Yes       Intervention Provide education and explanation of THRR including how the numbers were predicted and where they are located for reference       Expected Outcomes Short Term: Able to state/look up THRR;Short Term: Able to use daily as guideline for intensity in rehab;Long Term: Able to use THRR to govern intensity when exercising independently       Able to check pulse independently Yes       Intervention Provide education and demonstration on how to check pulse in carotid and radial arteries.;Review the importance of being able to check your own pulse for safety during independent exercise       Expected Outcomes Short Term: Able to explain why pulse checking is important during independent exercise;Long Term: Able to check pulse independently and accurately       Understanding of Exercise Prescription Yes       Intervention Provide education, explanation, and written materials on patient's individual exercise prescription       Expected Outcomes Short Term: Able to explain program exercise prescription;Long Term: Able to explain home exercise prescription to exercise  independently                Exercise Goals Re-Evaluation :  Exercise Goals Re-Evaluation     [place] Name 11/20/21 1002 11/20/21 1409 12/02/21 1600 12/16/21 0830 12/18/21 1007     Exercise Goal Re-Evaluation   Exercise Goals Review Increase Physical Activity;Able to understand and use rate of perceived exertion (RPE) scale;Knowledge and understanding of Target Heart Rate Range (THRR);Understanding of Exercise Prescription;Increase Strength and Stamina;Able to understand and use Dyspnea scale;Able to check pulse independently Increase Physical Activity;Understanding of Exercise Prescription;Increase Strength and Stamina Increase Physical Activity;Understanding of Exercise Prescription;Increase Strength and Stamina Increase Physical Activity;Understanding of Exercise Prescription;Increase Strength and Stamina Increase Physical Activity;Understanding of Exercise Prescription;Increase Strength and Stamina;Able to understand and use rate of perceived exertion (RPE) scale;Able to understand and use Dyspnea scale;Knowledge and understanding of Target Heart Rate Range (THRR);Able to check pulse independently   Comments Reviewed RPE and dyspnea scales, THR and program prescription with pt today.  Pt voiced understanding and was given a copy of goals to take home. Yaneisy did well on her first day in the program. Pt will benefit from continuing to attend rehab. Brandy is off to a great start in rehab.  Some days we have been able to use automatic cuff and some days need to use dopplar for blood pressures.  She has only attended three full sessions as she had to miss a few appointments due to an alarm on her VAD but was found to be to dry and meds were adjusted.  She is up to level 1.5 on the bike and level 3 on T5 NuStep. We will conitnue to monitor her progress. Armonie is doing well in rehab. She has done well on the treadmill at a speed of 1.5 mph. She also increased to level 5 on the T4 machine. Maki has tolerated  using 3 lb hand weights for resistance training as well. We will continue to monitor her progress in the program. Updated home exercise with pt today.  Pt plans to walk and use treadmill and bike at home for exercise.  Reviewed THR, pulse, RPE, sign and symptoms, pulse oximetery and when to call 911 or MD.  Also review LVAD parameters and reminded of precautions for it with exercise at home.  Also discussed weather considerations and indoor options.  Pt voiced understanding.   Expected Outcomes Short: Use RPE daily to regulate intensity. Long: Follow program prescription in THR. Short: Use RPE daily to regulate intensity. Long: Follow program prescription in THR. Short: Continue ot attend rehab regularly Long: Continue to follow program prescription Short: Continue to increase laps on the track Long: Continue to improve strength and stamina. Short: Start to add exercise back in at home Long: Continue to rebuild stamina.    [place] Name 12/30/21 0938             Exercise Goal Re-Evaluation   Exercise Goals Review Increase Physical Activity;Increase Strength and Stamina;Understanding of Exercise Prescription       Comments Zahava has been doing well in rehab.  She missed part of last week by needing iron infusions and med adjustments.  She has worked her way up to 2% grade on the treadmill.  We will encouarge her to try 4 lb weights and continue to montior her progress.       Expected Outcomes Short: Try 4 lb hand weights once back to exercise again Long; Continue to build stamina  Discharge Exercise Prescription (Final Exercise Prescription Changes):  Exercise Prescription Changes - 12/30/21 0900       Response to Exercise   Blood Pressure (Admit) 103/57    Blood Pressure (Exit) 78/65    Heart Rate (Admit) 100 bpm    Heart Rate (Exercise) 118 bpm    Heart Rate (Exit) 100 bpm    Rating of Perceived Exertion (Exercise) 13    Symptoms none    Duration Continue with 30 min of  aerobic exercise without signs/symptoms of physical distress.    Intensity THRR unchanged      Progression   Progression Continue to progress workloads to maintain intensity without signs/symptoms of physical distress.    Average METs 2.72      Resistance Training   Training Prescription Yes    Weight 3 lb    Reps 10-15      Interval Training   Interval Training No      Treadmill   MPH 1.7    Grade 2    Minutes 15    METs 2.77      Recumbant Bike   Level 2    Watts 19    Minutes 15    METs 2.6      REL-XR   Level 2    Minutes 15    METs 3.1      Home Exercise Plan   Plans to continue exercise at Home (comment)   walking, treadmill, staff videos   Frequency Add 2 additional days to program exercise sessions.    Initial Home Exercises Provided 12/18/21      Oxygen   Maintain Oxygen Saturation 88% or higher             Nutrition:  Target Goals: Understanding of nutrition guidelines, daily intake of sodium '1500mg'$ , cholesterol '200mg'$ , calories 30% from fat and 7% or less from saturated fats, daily to have 5 or more servings of fruits and vegetables.  Education: All About Nutrition: -Group instruction provided by verbal, written material, interactive activities, discussions, models, and posters to present general guidelines for heart healthy nutrition including fat, fiber, MyPlate, the role of sodium in heart healthy nutrition, utilization of the nutrition label, and utilization of this knowledge for meal planning. Follow up email sent as well. Written material given at graduation. Flowsheet Row Cardiac Rehab from 11/13/2021 in [facility] Cardiac and Pulmonary Rehab  Education need identified 11/13/21       Biometrics:  Pre Biometrics - 11/13/21 1234       Pre Biometrics   Height '5\' 4"'$  (1.626 m)    Weight 230 lb 12.8 oz (104.7 kg)   Weighed with LVAD   BMI (Calculated) 39.6    Single Leg Stand 30 seconds              Nutrition Therapy Plan and Nutrition  Goals:  Nutrition Therapy & Goals - 12/09/21 0912       Nutrition Therapy   Diet Heart healthy, low Na    Drug/Food Interactions Statins/Certain Fruits;Coumadin/Vit K    Protein (specify units) 100g    Fiber 25 grams    Whole Grain Foods 3 servings    Saturated Fats 12 max. grams    Fruits and Vegetables 8 servings/day    Sodium 1.5 grams      Personal Nutrition Goals   Nutrition Goal ST: limit sugar-sweetened beverages, try citris or vinegar with meals to help with flavor, include vitamin K rich food consistently - work with health  care team LT: continue with mechanical eating to meet nutritional needs, follow MyPlate guidelines for meals, limit added sugar <24g/day    Comments 51 y.o. F admitted to cardiac rehab  for chronic combined systolic and diastolic HF. PMHx includes hypotension, CVA (2021), NICM EF 20-25%, a.fib, UARS, hyperthyroidism, LVAD (08/2021), moderate malnutrition (08/2021), MI (2015). Relevant medications includes methimazole, crestor, prednisone, warfarin, zinc, torsemide PRN. PYP Score: 70. Vegetables & Fruits 7/12. Breads, Grains & Cereals 8/12. Red & Processed Meat 9/12. Poultry 2/2. Fish & Shellfish 2/4. Beans, Nuts & Seeds 3/4. Milk & Dairy Foods 6/6. Toppings, Oils, Seasonings & Salt 13/20. Sweets, Snacks & Restaurant Food 12/14. Beverages 8/10.  Breakfast: 1 egg with 1.5 pieces of bacon or [place] bacon Snacks: almonds, skinny pop, watermelon Lunch: same as dinner Snacks: almonds, skinny pop, watermelon Dinner: pork tenderloin, chicken breast, occasional steak - cook to have for the week. They have a garden so she will eat vegetables from her garden - she is limiting her vitamin K rich food. She will cook with some butter but mostly she will use pam spray. She rarely has fried foods, but will sometimes have fried chicken. She does not salt her food. Drinks: lemonade, water, OJ, sometimes cranberry-watermelon juice. She reports that she is practicing mechanical eating as she  is not feeling her hunger cues. She is also struggling with a metallic taste in her mouth which she reports is getting better. Encouraged to have protein rich foods at each meal and spoke to her higher protein needs. Encouraged her to limit her sugar sweetened beverages and include more nutrient dense foods such as peanut butter as this is something she enjoys. She would like to lose weight - discussed how it is important she meet her nutriitonal needs to ensure proper nutrition and limit muscle loss; also discussed the body's compensatory mechanisms. She reorts limiting vitamin K rich food, discussed updated recommendations to include these foods consistently and in similar amounts and to work with health care team. Reviewed heart healthy eating.      Intervention Plan   Intervention Prescribe, educate and counsel regarding individualized specific dietary modifications aiming towards targeted core components such as weight, hypertension, lipid management, diabetes, heart failure and other comorbidities.    Expected Outcomes Short Term Goal: Understand basic principles of dietary content, such as calories, fat, sodium, cholesterol and nutrients.;Short Term Goal: A plan has been developed with personal nutrition goals set during dietitian appointment.;Long Term Goal: Adherence to prescribed nutrition plan.             Nutrition Assessments:  MEDIFICTS Score Key: ?70 Need to make dietary changes  40-70 Heart Healthy Diet ? 40 Therapeutic Level Cholesterol Diet  Flowsheet Row Cardiac Rehab from 11/13/2021 in [facility] Cardiac and Pulmonary Rehab  Picture Your Plate Total Score on Admission 70      Picture Your Plate Scores: <92 Unhealthy dietary pattern with much room for improvement. 41-50 Dietary pattern unlikely to meet recommendations for good health and room for improvement. 51-60 More healthful dietary pattern, with some room for improvement.  >60 Healthy dietary pattern, although there may  be some specific behaviors that could be improved.    Nutrition Goals Re-Evaluation:   Nutrition Goals Discharge (Final Nutrition Goals Re-Evaluation):   Psychosocial: Target Goals: Acknowledge presence or absence of significant depression and/or stress, maximize coping skills, provide positive support system. Participant is able to verbalize types and ability to use techniques and skills needed for reducing stress and depression.  Education: Stress, Anxiety, and Depression - Group verbal and visual presentation to define topics covered.  Reviews how body is impacted by stress, anxiety, and depression.  Also discusses healthy ways to reduce stress and to treat/manage anxiety and depression.  Written material given at graduation. Flowsheet Row Cardiac Rehab from 10/17/2020 in [facility] Cardiac and Pulmonary Rehab  Education need identified 08/06/20  Date 09/26/20  Educator [facility]  Instruction Review Code 1- [employer] Understanding       Education: Sleep Hygiene -Provides group verbal and written instruction about how sleep can affect your health.  Define sleep hygiene, discuss sleep cycles and impact of sleep habits. Review good sleep hygiene tips.    Initial Review & Psychosocial Screening:  Initial Psych Review & Screening - 11/01/21 1410       Initial Review   Current issues with None Identified      Family Dynamics   Good Support System? Yes   husband and 60 and 23 years children     Barriers   Psychosocial barriers to participate in program There are no identifiable barriers or psychosocial needs.      Screening Interventions   Interventions Encouraged to exercise    Expected Outcomes Short Term goal: Utilizing psychosocial counselor, staff and physician to assist with identification of specific Stressors or current issues interfering with healing process. Setting desired goal for each stressor or current issue identified.;Long Term Goal: Stressors or current issues are  controlled or eliminated.;Short Term goal: Identification and review with participant of any Quality of Life or Depression concerns found by scoring the questionnaire.;Long Term goal: The participant improves quality of Life and PHQ9 Scores as seen by post scores and/or verbalization of changes             Quality of Life Scores:   Quality of Life - 11/13/21 1230       Quality of Life   Select Quality of Life      Quality of Life Scores   Health/Function Pre 27.57 %    Socioeconomic Pre 29.64 %    Psych/Spiritual Pre 30 %    Family Pre 30 %    GLOBAL Pre 28.85 %            Scores of 19 and below usually indicate a poorer quality of life in these areas.  A difference of  2-3 points is a clinically meaningful difference.  A difference of 2-3 points in the total score of the Quality of Life Index has been associated with significant improvement in overall quality of life, self-image, physical symptoms, and general health in studies assessing change in quality of life.  PHQ-9: Review Flowsheet  More data exists      11/13/2021 09/03/2020 08/06/2020 10/05/2017 11/27/2014  Depression screen PHQ 2/9  Decreased Interest 0 0 0 1 0  Down, Depressed, Hopeless 0 0 0 0 0  PHQ - 2 Score 0 0 0 1 0  Altered sleeping 0 '1 1 1 '$ -  Tired, decreased energy '1 1 2 2 '$ -  Change in appetite 1 0 1 - -  Feeling bad or failure about yourself  0 0 0 1 -  Trouble concentrating 0 0 0 0 -  Moving slowly or fidgety/restless 0 0 1 0 -  Suicidal thoughts 0 0 0 0 -  PHQ-9 Score '2 2 5 5 '$ -  Difficult doing work/chores Not difficult at all Not difficult at all Not difficult at all Somewhat difficult -   Interpretation of Total  Score  Total Score Depression Severity:  1-4 = Minimal depression, 5-9 = Mild depression, 10-14 = Moderate depression, 15-19 = Moderately severe depression, 20-27 = Severe depression   Psychosocial Evaluation and Intervention:  Psychosocial Evaluation - 11/01/21 1419        Psychosocial Evaluation & Interventions   Interventions Encouraged to exercise with the program and follow exercise prescription    Comments Delphia is returning to the program after LVAD insertion. She has no barriers to attending. She has become deconditioned after a long period in the hospital and recovering. She has lost weight and wants to lose more weight. She is trying to increase her protein intake and wants to talk with Melissa,our RD.  Leia is ready to get started.    Expected Outcomes STG Ellyn ia able to attend all sechedules sessions. She is able to work with our RD and find ways to increase her protein intake without gaining weight if possible. LTG Sharita is able to continue to progress with er exercise and weight loss goals    Continue Psychosocial Services  Follow up required by staff             Psychosocial Re-Evaluation:  Psychosocial Re-Evaluation     Pace Name 12/18/21 1008             Psychosocial Re-Evaluation   Current issues with Current Stress Concerns;Current Sleep Concerns       Comments Teofila is doing well in rehab.  She has not been using her CPAP as she does not feel like she stops breathing anymore. She has a sleep study scheduled for 7/10 with Dr. [name].  She feels that before it was from fluid build, but now that she is dryer she feels better.  Her biggest stressor is dealing with LVAD.  She is just happy to be alive and stay out of hospital.  She does have several appointments to keep up with, but just focuses on the positives.       Expected Outcomes Short: Get sleep study Long: Continue to focus on the positive       Interventions Encouraged to attend Cardiac Rehabilitation for the exercise;Stress management education                Psychosocial Discharge (Final Psychosocial Re-Evaluation):  Psychosocial Re-Evaluation - 12/18/21 1008       Psychosocial Re-Evaluation   Current issues with Current Stress Concerns;Current Sleep Concerns     Comments Sharel is doing well in rehab.  She has not been using her CPAP as she does not feel like she stops breathing anymore. She has a sleep study scheduled for 7/10 with Dr. [name].  She feels that before it was from fluid build, but now that she is dryer she feels better.  Her biggest stressor is dealing with LVAD.  She is just happy to be alive and stay out of hospital.  She does have several appointments to keep up with, but just focuses on the positives.    Expected Outcomes Short: Get sleep study Long: Continue to focus on the positive    Interventions Encouraged to attend Cardiac Rehabilitation for the exercise;Stress management education             Vocational Rehabilitation: Provide vocational rehab assistance to qualifying candidates.   Vocational Rehab Evaluation & Intervention:   Education: Education Goals: Education classes will be provided on a variety of topics geared toward better understanding of heart health and risk factor modification. Participant will state  understanding/return demonstration of topics presented as noted by education test scores.  Learning Barriers/Preferences:   General Cardiac Education Topics:  AED/CPR: - Group verbal and written instruction with the use of models to demonstrate the basic use of the AED with the basic ABC's of resuscitation.   Anatomy and Cardiac Procedures: - Group verbal and visual presentation and models provide information about basic cardiac anatomy and function. Reviews the testing methods done to diagnose heart disease and the outcomes of the test results. Describes the treatment choices: Medical Management, Angioplasty, or Coronary Bypass Surgery for treating various heart conditions including Myocardial Infarction, Angina, Valve Disease, and Cardiac Arrhythmias.  Written material given at graduation. Flowsheet Row Cardiac Rehab from 11/13/2021 in [facility] Cardiac and Pulmonary Rehab  Education need identified 11/13/21        Medication Safety: - Group verbal and visual instruction to review commonly prescribed medications for heart and lung disease. Reviews the medication, class of the drug, and side effects. Includes the steps to properly store meds and maintain the prescription regimen.  Written material given at graduation. Flowsheet Row Cardiac Rehab from 10/17/2020 in [facility] Cardiac and Pulmonary Rehab  Date 09/05/20  Educator SB  Instruction Review Code 1- Verbalizes Understanding       Intimacy: - Group verbal instruction through game format to discuss how heart and lung disease can affect sexual intimacy. Written material given at graduation.. Flowsheet Row Cardiac Rehab from 10/17/2020 in [facility] Cardiac and Pulmonary Rehab  Date 08/08/20  Educator [facility]  Instruction Review Code 1- Verbalizes Understanding       Know Your Numbers and Heart Failure: - Group verbal and visual instruction to discuss disease risk factors for cardiac and pulmonary disease and treatment options.  Reviews associated critical values for Overweight/Obesity, Hypertension, Cholesterol, and Diabetes.  Discusses basics of heart failure: signs/symptoms and treatments.  Introduces Heart Failure Zone chart for action plan for heart failure.  Written material given at graduation. Flowsheet Row Cardiac Rehab from 11/13/2021 in [facility] Cardiac and Pulmonary Rehab  Education need identified 11/13/21       Infection Prevention: - Provides verbal and written material to individual with discussion of infection control including proper hand washing and proper equipment cleaning during exercise session. Flowsheet Row Cardiac Rehab from 11/13/2021 in [facility] Cardiac and Pulmonary Rehab  Education need identified 11/13/21  Date 11/13/21  Educator [place]  Instruction Review Code 1- Verbalizes Understanding       Falls Prevention: - Provides verbal and written material to individual with discussion of falls prevention and safety. Flowsheet Row  Cardiac Rehab from 11/13/2021 in [facility] Cardiac and Pulmonary Rehab  Education need identified 11/13/21  Date 11/13/21  Educator [place]  Instruction Review Code 1- Verbalizes Understanding       Other: -Provides group and verbal instruction on various topics (see comments)   Knowledge Questionnaire Score:  Knowledge Questionnaire Score - 11/13/21 1229       Knowledge Questionnaire Score   Pre Score 23/26             Core Components/Risk Factors/Patient Goals at Admission:  Personal Goals and Risk Factors at Admission - 11/13/21 1258       Core Components/Risk Factors/Patient Goals on Admission    Weight Management Yes;Weight Loss;Obesity    Intervention Weight Management: Develop a combined nutrition and exercise program designed to reach desired caloric intake, while maintaining appropriate intake of nutrient and fiber, sodium and fats, and appropriate energy expenditure required for the weight goal.;Weight Management/Obesity: Establish  reasonable short term and long term weight goals.    Admit Weight 230 lb 12.8 oz (104.7 kg)   weighed with LVAD & bag   Goal Weight: Short Term 225 lb (102.1 kg)    Goal Weight: Long Term 200 lb (90.7 kg)    Expected Outcomes Short Term: Continue to assess and modify interventions until short term weight is achieved;Long Term: Adherence to nutrition and physical activity/exercise program aimed toward attainment of established weight goal;Weight Loss: Understanding of general recommendations for a balanced deficit meal plan, which promotes 1-2 lb weight loss per week and includes a negative energy balance of [phone] kcal/d;Understanding recommendations for meals to include 15-35% energy as protein, 25-35% energy from fat, 35-60% energy from carbohydrates, less than '200mg'$  of dietary cholesterol, 20-35 gm of total fiber daily;Understanding of distribution of calorie intake throughout the day with the consumption of 4-5 meals/snacks    Heart Failure Yes     Intervention Provide a combined exercise and nutrition program that is supplemented with education, support and counseling about heart failure. Directed toward relieving symptoms such as shortness of breath, decreased exercise tolerance, and extremity edema.    Expected Outcomes Improve functional capacity of life;Short term: Attendance in program 2-3 days a week with increased exercise capacity. Reported lower sodium intake. Reported increased fruit and vegetable intake. Reports medication compliance.;Short term: Daily weights obtained and reported for increase. Utilizing diuretic protocols set by physician.;Long term: Adoption of self-care skills and reduction of barriers for early signs and symptoms recognition and intervention leading to self-care maintenance.    Hypertension Yes    Intervention Provide education on lifestyle modifcations including regular physical activity/exercise, weight management, moderate sodium restriction and increased consumption of fresh fruit, vegetables, and low fat dairy, alcohol moderation, and smoking cessation.;Monitor prescription use compliance.    Expected Outcomes Short Term: Continued assessment and intervention until BP is < 140/20m HG in hypertensive participants. < 130/830mHG in hypertensive participants with diabetes, heart failure or chronic kidney disease.;Long Term: Maintenance of blood pressure at goal levels.    Lipids Yes    Intervention Provide education and support for participant on nutrition & aerobic/resistive exercise along with prescribed medications to achieve LDL '70mg'$ , HDL >'40mg'$ .    Expected Outcomes Short Term: Participant states understanding of desired cholesterol values and is compliant with medications prescribed. Participant is following exercise prescription and nutrition guidelines.;Long Term: Cholesterol controlled with medications as prescribed, with individualized exercise RX and with personalized nutrition plan. Value goals: LDL <  '70mg'$ , HDL > 40 mg.             Education:Diabetes - Individual verbal and written instruction to review signs/symptoms of diabetes, desired ranges of glucose level fasting, after meals and with exercise. Acknowledge that pre and post exercise glucose checks will be done for 3 sessions at entry of program.   Core Components/Risk Factors/Patient Goals Review:   Goals and Risk Factor Review     Row Name 12/18/21 1012             Core Components/Risk Factors/Patient Goals Review   Personal Goals Review Heart Failure;Lipids;Weight Management/Obesity;Hypertension       Review AnLynnettas doing well in rehab.  Her weight is starting to go up some now that she is eating again, but she is working closely with dietitan to keep close eye on it.  Now that she can eat, she will start to focus on heart healthy eating again. She is good about checking her LVAD and staying  on top of her fluid levels.  She has not had any heart failure symptoms.  Her pressures are staying steady both in class and at home.                Core Components/Risk Factors/Patient Goals at Discharge (Final Review):   Goals and Risk Factor Review - 12/18/21 1012       Core Components/Risk Factors/Patient Goals Review   Personal Goals Review Heart Failure;Lipids;Weight Management/Obesity;Hypertension    Review Tajana is doing well in rehab.  Her weight is starting to go up some now that she is eating again, but she is working closely with dietitan to keep close eye on it.  Now that she can eat, she will start to focus on heart healthy eating again. She is good about checking her LVAD and staying on top of her fluid levels.  She has not had any heart failure symptoms.  Her pressures are staying steady both in class and at home.             ITP Comments:  ITP Comments     Row Name 11/01/21 1424 11/13/21 1223 11/20/21 1002 12/04/21 0731 12/09/21 1015   ITP Comments Virtual orientation call completed today. shehas an  appointment on Date: 11/13/2021  for EP eval and gym Orientation.  Documentation of diagnosis can be found in [facility] Date: 08/25/2021 . Completed 6MWT and gym orientation. Initial ITP created and sent for review to Dr. Emily Filbert, Medical Director. First full day of exercise!  Patient was oriented to gym and equipment including functions, settings, policies, and procedures.  Patient's individual exercise prescription and treatment plan were reviewed.  All starting workloads were established based on the results of the 6 minute walk test done at initial orientation visit.  The plan for exercise progression was also introduced and progression will be customized based on patient's performance and goals. 30 Day review completed. Medical Director ITP review done, changes made as directed, and signed approval by Medical Director. Completed initial RD consultation    Row Name 12/30/21 [phone] 01/01/22 0743         ITP Comments Heylee missed part of last week due to multiple iron infusions in the ED and med adjustments.  She was also feeling washed out on Friday from all her appointments. 30 Day review completed. Medical Director ITP review done, changes made as directed, and signed approval by Medical Director.               Comments:

## 2022-01-02 ENCOUNTER — Telehealth: Payer: Self-pay

## 2022-01-02 ENCOUNTER — Other Ambulatory Visit (HOSPITAL_COMMUNITY): Payer: Self-pay | Admitting: *Deleted

## 2022-01-02 ENCOUNTER — Ambulatory Visit (HOSPITAL_COMMUNITY): Payer: Self-pay | Admitting: Pharmacist

## 2022-01-02 DIAGNOSIS — Z7901 Long term (current) use of anticoagulants: Secondary | ICD-10-CM

## 2022-01-02 DIAGNOSIS — Z95811 Presence of heart assist device: Secondary | ICD-10-CM

## 2022-01-02 DIAGNOSIS — I5022 Chronic systolic (congestive) heart failure: Secondary | ICD-10-CM

## 2022-01-02 LAB — POCT INR: INR: 1.5 — AB (ref 2.0–3.0)

## 2022-01-02 NOTE — Progress Notes (Signed)
LVAD INR 

## 2022-01-02 NOTE — Telephone Encounter (Signed)
Spoke with patient and let her know that her surgery has been booked for 02/05/22 at [facility]. She needs to proceed with the plan for ultrasound in August and office visit with [name] as well as a pre-op with Joylene John, NP. Advised patient that she may get a call from the hospital to arrange a pre-admissions appointment. Cardiology will also be reaching out, they recommend admission under their service before and after surgery. Patient verbalized understanding.  Follow up visit scheduled with [name] 01/30/22 at 3:30pm and at 3:45pm with Joylene John, NP. Ultrasound appointment rescheduled to 01/28/22 at 2pm. Patient verbalized understanding and is in agreement of appointment dates and times. Instructed to call with any needs.

## 2022-01-03 ENCOUNTER — Encounter: Payer: BC Managed Care – PPO | Admitting: *Deleted

## 2022-01-03 DIAGNOSIS — Z95811 Presence of heart assist device: Secondary | ICD-10-CM | POA: Diagnosis not present

## 2022-01-03 DIAGNOSIS — I5042 Chronic combined systolic (congestive) and diastolic (congestive) heart failure: Secondary | ICD-10-CM

## 2022-01-03 NOTE — Progress Notes (Signed)
Daily Session Note  Patient Details  Name: [name] MRN: [id number] Date of Birth: [birth year] Referring Provider:   Flowsheet Row Cardiac Rehab from 11/13/2021 in [facility] Cardiac and Pulmonary Rehab  Referring Provider [name] MD       Encounter Date: 01/03/2022  Check In:  Session Check In - 01/03/22 1037       Check-In   Supervising physician immediately available to respond to emergencies See telemetry face sheet for immediately available ER MD    Location [facility]-Cardiac & Pulmonary Rehab    Staff Present Heath Lark, RN, BSN, CCRP;Jessica Alma, MA, RCEP, CCRP, CCET;Other   Seward Carol MS,ACSM CEP Exercise Physiologist   Virtual Visit No    Medication changes reported     No    Fall or balance concerns reported    No    Warm-up and Cool-down Performed on first and last piece of equipment    Resistance Training Performed Yes    VAD Patient? Yes    PAD/SET Patient? No      VAD patient   Has spare charged batteries? Yes    Has battery cables? Yes    Has compatible battery clips? Yes      Pain Assessment   Currently in Pain? No/denies                Social History   Tobacco Use  Smoking Status Never  Smokeless Tobacco Never    Goals Met:  Independence with exercise equipment Exercise tolerated well No report of concerns or symptoms today  Goals Unmet:  Not Applicable  Comments: Pt able to follow exercise prescription today without complaint.  Will continue to monitor for progression.    Dr. Emily Filbert is Medical Director for [place].  Dr. Ottie Glazier is Medical Director for [facility] Pulmonary Rehabilitation.

## 2022-01-06 ENCOUNTER — Encounter: Payer: BC Managed Care – PPO | Admitting: *Deleted

## 2022-01-06 DIAGNOSIS — Z95811 Presence of heart assist device: Secondary | ICD-10-CM

## 2022-01-06 DIAGNOSIS — I5042 Chronic combined systolic (congestive) and diastolic (congestive) heart failure: Secondary | ICD-10-CM

## 2022-01-06 NOTE — Progress Notes (Signed)
Daily Session Note  Patient Details  Name: [name] MRN: [id number] Date of Birth: 04/17/1971 Referring Provider:   Flowsheet Row Cardiac Rehab from 11/13/2021 in [facility] Cardiac and Pulmonary Rehab  Referring Provider McLean, Dalton MD       Encounter Date: 01/06/2022  Check In:  Session Check In - 01/06/22 1049       Check-In   Supervising physician immediately available to respond to emergencies See telemetry face sheet for immediately available ER MD    Location [facility]-Cardiac & Pulmonary Rehab    Staff Present Susanne Bice, RN, BSN, CCRP;Kristen Coble, RN,BC,MSN;Kelly Hayes, BS, ACSM CEP, Exercise Physiologist    Virtual Visit No    Medication changes reported     No    Fall or balance concerns reported    No    Warm-up and Cool-down Performed on first and last piece of equipment    Resistance Training Performed Yes    VAD Patient? Yes    PAD/SET Patient? No      VAD patient   Has back up controller? Yes    Has spare charged batteries? Yes    Has battery cables? Yes    Has compatible battery clips? Yes      Pain Assessment   Currently in Pain? No/denies                Social History   Tobacco Use  Smoking Status Never  Smokeless Tobacco Never    Goals Met:  Independence with exercise equipment Exercise tolerated well No report of concerns or symptoms today  Goals Unmet:  Not Applicable  Comments: Pt able to follow exercise prescription today without complaint.  Will continue to monitor for progression.    Dr. Mark Miller is Medical Director for [facility].  Dr. Fuad Aleskerov is Medical Director for [facility] Pulmonary [facility]. 

## 2022-01-07 ENCOUNTER — Ambulatory Visit (HOSPITAL_COMMUNITY): Payer: Self-pay | Admitting: Pharmacist

## 2022-01-07 ENCOUNTER — Ambulatory Visit (HOSPITAL_COMMUNITY)
Admission: RE | Admit: 2022-01-07 | Discharge: 2022-01-07 | Disposition: A | Discharge status: Home / Self Care | Payer: BC Managed Care – PPO | Source: Ambulatory Visit | Attending: Internal Medicine | Admitting: Internal Medicine

## 2022-01-07 DIAGNOSIS — Z95811 Presence of heart assist device: Secondary | ICD-10-CM | POA: Diagnosis not present

## 2022-01-07 DIAGNOSIS — Z7901 Long term (current) use of anticoagulants: Secondary | ICD-10-CM | POA: Diagnosis not present

## 2022-01-07 DIAGNOSIS — I5022 Chronic systolic (congestive) heart failure: Secondary | ICD-10-CM | POA: Diagnosis not present

## 2022-01-07 LAB — BASIC METABOLIC PANEL
Anion gap: 7 (ref 5–15)
BUN: 15 mg/dL (ref 6–20)
CO2: 20 mmol/L — ABNORMAL LOW (ref 22–32)
Calcium: 9.2 mg/dL (ref 8.9–10.3)
Chloride: 111 mmol/L (ref 98–111)
Creatinine, Ser: 1.28 mg/dL — ABNORMAL HIGH (ref 0.44–1.00)
GFR, Estimated: 51 mL/min — ABNORMAL LOW (ref >60)
Glucose, Bld: 102 mg/dL — ABNORMAL HIGH (ref 70–99)
Potassium: 4.2 mmol/L (ref 3.5–5.1)
Sodium: 138 mmol/L (ref 135–145)

## 2022-01-07 LAB — PROTIME-INR
INR: 1.6 — ABNORMAL HIGH (ref 0.8–1.2)
Prothrombin Time: 18.6 seconds — ABNORMAL HIGH (ref 11.4–15.2)

## 2022-01-07 NOTE — Progress Notes (Signed)
LVAD INR 

## 2022-01-08 DIAGNOSIS — I5042 Chronic combined systolic (congestive) and diastolic (congestive) heart failure: Secondary | ICD-10-CM | POA: Diagnosis not present

## 2022-01-08 DIAGNOSIS — Z95811 Presence of heart assist device: Secondary | ICD-10-CM

## 2022-01-08 DIAGNOSIS — I5022 Chronic systolic (congestive) heart failure: Secondary | ICD-10-CM

## 2022-01-08 NOTE — Progress Notes (Signed)
Daily Session Note  Patient Details  Name: [name] MRN: [id number] Date of Birth: March 13, 1971 Referring Provider:   Flowsheet Row Cardiac Rehab from 11/13/2021 in [facility] Cardiac and Pulmonary Rehab  Referring Provider [name] MD       Encounter Date: 01/08/2022  Check In:  Session Check In - 01/08/22 0955       Check-In   Supervising physician immediately available to respond to emergencies See telemetry face sheet for immediately available ER MD    Location [facility]-Cardiac & Pulmonary Rehab    Staff Present Antionette Fairy, BS, Exercise Physiologist;Susanne Bice, RN, BSN, CCRP;Kristen Coble, RN,BC,MSN;Melissa Everson, RDN, LDN    Virtual Visit No    Medication changes reported     No    Fall or balance concerns reported    No    Tobacco Cessation No Change    Warm-up and Cool-down Performed on first and last piece of equipment    Resistance Training Performed Yes    VAD Patient? Yes    PAD/SET Patient? No      VAD patient   Has back up controller? Yes    Has spare charged batteries? Yes    Has battery cables? Yes    Has compatible battery clips? Yes      Pain Assessment   Currently in Pain? No/denies    Multiple Pain Sites No                Social History   Tobacco Use  Smoking Status Never  Smokeless Tobacco Never    Goals Met:  Independence with exercise equipment Exercise tolerated well No report of concerns or symptoms today  Goals Unmet:  Not Applicable  Comments: Pt able to follow exercise prescription today without complaint.  Will continue to monitor for progression.    Dr. Emily Filbert is Medical Director for [place].  Dr. Ottie Glazier is Medical Director for [facility] Pulmonary Rehabilitation.

## 2022-01-10 ENCOUNTER — Encounter: Payer: BC Managed Care – PPO | Admitting: *Deleted

## 2022-01-10 DIAGNOSIS — I5042 Chronic combined systolic (congestive) and diastolic (congestive) heart failure: Secondary | ICD-10-CM | POA: Diagnosis not present

## 2022-01-10 DIAGNOSIS — Z95811 Presence of heart assist device: Secondary | ICD-10-CM

## 2022-01-10 NOTE — Progress Notes (Signed)
Daily Session Note  Patient Details  Name: [name] MRN: [id number] Date of Birth: 14-May-1971 Referring Provider:   Flowsheet Row Cardiac Rehab from 11/13/2021 in [facility] Cardiac and Pulmonary Rehab  Referring Provider [name] MD       Encounter Date: 01/10/2022  Check In:  Session Check In - 01/10/22 1007       Check-In   Supervising physician immediately available to respond to emergencies See telemetry face sheet for immediately available ER MD    Location [facility]-Cardiac & Pulmonary Rehab    Staff Present Hope Budds, RDN, LDN;Jessica Luan Pulling, MA, RCEP, CCRP, CCET;Berdie Malter Sherryll Burger, RN BSN    Virtual Visit No    Medication changes reported     No    Fall or balance concerns reported    No    Warm-up and Cool-down Performed on first and last piece of equipment    Resistance Training Performed Yes    VAD Patient? No    PAD/SET Patient? No      VAD patient   Has back up controller? Yes    Has spare charged batteries? Yes    Has battery cables? Yes    Has compatible battery clips? Yes                Social History   Tobacco Use  Smoking Status Never  Smokeless Tobacco Never    Goals Met:  Independence with exercise equipment Exercise tolerated well No report of concerns or symptoms today Strength training completed today  Goals Unmet:  Not Applicable  Comments: Pt able to follow exercise prescription today without complaint.  Will continue to monitor for progression.    Dr. Emily Filbert is Medical Director for [place].  Dr. Ottie Glazier is Medical Director for [facility] Pulmonary Rehabilitation.

## 2022-01-13 ENCOUNTER — Encounter: Payer: BC Managed Care – PPO | Admitting: *Deleted

## 2022-01-13 DIAGNOSIS — I5042 Chronic combined systolic (congestive) and diastolic (congestive) heart failure: Secondary | ICD-10-CM | POA: Diagnosis not present

## 2022-01-13 DIAGNOSIS — Z95811 Presence of heart assist device: Secondary | ICD-10-CM | POA: Diagnosis not present

## 2022-01-13 NOTE — Progress Notes (Signed)
Daily Session Note  Patient Details  Name: [name] MRN: [id number] Date of Birth: 1970/12/19 Referring Provider:   Flowsheet Row Cardiac Rehab from 11/13/2021 in [facility] Cardiac and Pulmonary Rehab  Referring Provider [name] MD       Encounter Date: 01/13/2022  Check In:  Session Check In - 01/13/22 1034       Check-In   Supervising physician immediately available to respond to emergencies See telemetry face sheet for immediately available ER MD    Location [facility]-Cardiac & Pulmonary Rehab    Staff Present Renita Papa, RN Moises Blood, BS, ACSM CEP, Exercise Physiologist;Jessica Luan Pulling, MA, RCEP, CCRP, CCET    Virtual Visit No    Medication changes reported     No    Fall or balance concerns reported    No    Warm-up and Cool-down Performed on first and last piece of equipment    Resistance Training Performed Yes    VAD Patient? Yes    PAD/SET Patient? No      VAD patient   Has back up controller? Yes    Has spare charged batteries? Yes    Has battery cables? Yes    Has compatible battery clips? Yes      Pain Assessment   Currently in Pain? No/denies                Social History   Tobacco Use  Smoking Status Never  Smokeless Tobacco Never    Goals Met:  Independence with exercise equipment Exercise tolerated well No report of concerns or symptoms today Strength training completed today  Goals Unmet:  Not Applicable  Comments: Pt able to follow exercise prescription today without complaint.  Will continue to monitor for progression.    Dr. Emily Filbert is Medical Director for [place].  Dr. Ottie Glazier is Medical Director for [facility] Pulmonary Rehabilitation.

## 2022-01-15 ENCOUNTER — Telehealth: Payer: Self-pay | Admitting: *Deleted

## 2022-01-15 ENCOUNTER — Encounter: Payer: BC Managed Care – PPO | Admitting: *Deleted

## 2022-01-15 DIAGNOSIS — Z95811 Presence of heart assist device: Secondary | ICD-10-CM

## 2022-01-15 DIAGNOSIS — I5042 Chronic combined systolic (congestive) and diastolic (congestive) heart failure: Secondary | ICD-10-CM

## 2022-01-15 DIAGNOSIS — G4733 Obstructive sleep apnea (adult) (pediatric): Secondary | ICD-10-CM

## 2022-01-15 NOTE — Progress Notes (Signed)
Daily Session Note  Patient Details  Name: [name] MRN: [id number] Date of Birth: 08-24-1970 Referring Provider:   Flowsheet Row Cardiac Rehab from 11/13/2021 in [facility] Cardiac and Pulmonary Rehab  Referring Provider [name] MD       Encounter Date: 01/15/2022  Check In:  Session Check In - 01/15/22 1046       Check-In   Supervising physician immediately available to respond to emergencies See telemetry face sheet for immediately available ER MD    Location [facility]-Cardiac & Pulmonary Rehab    Staff Present Heath Lark, RN, BSN, CCRP;Jessica [place], MA, RCEP, CCRP, [place], BS, ACSM CEP, Exercise Physiologist    Virtual Visit No    Medication changes reported     No    Fall or balance concerns reported    No    Warm-up and Cool-down Performed on first and last piece of equipment    Resistance Training Performed Yes    VAD Patient? Yes    PAD/SET Patient? No      VAD patient   Has back up controller? Yes    Has spare charged batteries? Yes    Has battery cables? Yes    Has compatible battery clips? Yes      Pain Assessment   Currently in Pain? No/denies                Social History   Tobacco Use  Smoking Status Never  Smokeless Tobacco Never    Goals Met:  Independence with exercise equipment Exercise tolerated well No report of concerns or symptoms today  Goals Unmet:  Not Applicable  Comments: Pt able to follow exercise prescription today without complaint.  Will continue to monitor for progression.    Dr. Emily Filbert is Medical Director for [place].  Dr. Ottie Glazier is Medical Director for [facility] Pulmonary Rehabilitation.

## 2022-01-15 NOTE — Telephone Encounter (Addendum)
The patient has been notified of the result and verbalized understanding.  All questions (if any) were answered. Marolyn Hammock, [place] 01/15/2022 12:23 PM    Per Dr [name]: please put an order in for the PSG to continue study if pulse ox is intermittent - will monitor air flow to rule out OSA  Precert PSG

## 2022-01-16 ENCOUNTER — Ambulatory Visit (HOSPITAL_COMMUNITY): Payer: Self-pay | Admitting: Pharmacist

## 2022-01-16 LAB — POCT INR: INR: 1.4 — AB (ref 2.0–3.0)

## 2022-01-16 MED ORDER — WARFARIN SODIUM 2 MG PO TABS: ORAL_TABLET | ORAL | 5 refills | Status: DC

## 2022-01-16 NOTE — Progress Notes (Signed)
LVAD INR 

## 2022-01-17 ENCOUNTER — Encounter: Payer: BC Managed Care – PPO | Admitting: *Deleted

## 2022-01-17 DIAGNOSIS — Z95811 Presence of heart assist device: Secondary | ICD-10-CM

## 2022-01-17 DIAGNOSIS — I5042 Chronic combined systolic (congestive) and diastolic (congestive) heart failure: Secondary | ICD-10-CM

## 2022-01-17 NOTE — Progress Notes (Signed)
Daily Session Note  Patient Details  Name: [name] MRN: [id number] Date of Birth: 12/15/1970 Referring Provider:   Flowsheet Row Cardiac Rehab from 11/13/2021 in [facility] Cardiac and Pulmonary Rehab  Referring Provider [name] MD       Encounter Date: 01/17/2022  Check In:  Session Check In - 01/17/22 1000       Check-In   Supervising physician immediately available to respond to emergencies See telemetry face sheet for immediately available ER MD    Location [facility]-Cardiac & Pulmonary Rehab    Staff Present Alberteen Sam, MA, RCEP, CCRP, CCET;Laureen Owens Shark, BS, RRT, CPFT;Radhika Dershem Sherryll Burger, RN BSN    Virtual Visit No    Medication changes reported     No    Fall or balance concerns reported    No    Warm-up and Cool-down Performed on first and last piece of equipment    Resistance Training Performed Yes    VAD Patient? Yes    PAD/SET Patient? No      VAD patient   Has back up controller? Yes    Has spare charged batteries? Yes    Has battery cables? Yes    Has compatible battery clips? Yes      Pain Assessment   Currently in Pain? No/denies                Social History   Tobacco Use  Smoking Status Never  Smokeless Tobacco Never    Goals Met:  Independence with exercise equipment Exercise tolerated well No report of concerns or symptoms today Strength training completed today  Goals Unmet:  Not Applicable  Comments: Pt able to follow exercise prescription today without complaint.  Will continue to monitor for progression.    Dr. Emily Filbert is Medical Director for [place].  Dr. Ottie Glazier is Medical Director for [facility] Pulmonary Rehabilitation.

## 2022-01-20 ENCOUNTER — Encounter: Payer: BC Managed Care – PPO | Admitting: *Deleted

## 2022-01-20 DIAGNOSIS — I5042 Chronic combined systolic (congestive) and diastolic (congestive) heart failure: Secondary | ICD-10-CM | POA: Diagnosis not present

## 2022-01-20 DIAGNOSIS — Z95811 Presence of heart assist device: Secondary | ICD-10-CM | POA: Diagnosis not present

## 2022-01-20 NOTE — Progress Notes (Signed)
Daily Session Note  Patient Details  Name: [name] MRN: [id number] Date of Birth: 01-10-1971 Referring Provider:   Flowsheet Row Cardiac Rehab from 11/13/2021 in [facility] Cardiac and Pulmonary Rehab  Referring Provider [name] MD       Encounter Date: 01/20/2022  Check In:  Session Check In - 01/20/22 1045       Check-In   Supervising physician immediately available to respond to emergencies See telemetry face sheet for immediately available ER MD    Location [facility]-Cardiac & Pulmonary Rehab    Staff Present Heath Lark, RN, BSN, [place]aveda Norman, BS, ACSM CEP, Exercise Physiologist;Meredith Sherryll Burger, RN BSN    Virtual Visit No    Medication changes reported     No    Fall or balance concerns reported    No    Warm-up and Cool-down Performed on first and last piece of equipment    Resistance Training Performed Yes    VAD Patient? Yes    PAD/SET Patient? No      VAD patient   Has back up controller? Yes    Has spare charged batteries? Yes    Has battery cables? Yes    Has compatible battery clips? Yes      Pain Assessment   Currently in Pain? No/denies                Social History   Tobacco Use  Smoking Status Never  Smokeless Tobacco Never    Goals Met:  Independence with exercise equipment Exercise tolerated well No report of concerns or symptoms today  Goals Unmet:  Not Applicable  Comments: Pt able to follow exercise prescription today without complaint.  Will continue to monitor for progression.    Dr. Emily Filbert is Medical Director for [place].  Dr. Ottie Glazier is Medical Director for [facility] Pulmonary Rehabilitation.

## 2022-01-22 ENCOUNTER — Encounter: Payer: BC Managed Care – PPO | Attending: Cardiology

## 2022-01-22 DIAGNOSIS — Z95811 Presence of heart assist device: Secondary | ICD-10-CM | POA: Diagnosis not present

## 2022-01-22 DIAGNOSIS — I5042 Chronic combined systolic (congestive) and diastolic (congestive) heart failure: Secondary | ICD-10-CM | POA: Insufficient documentation

## 2022-01-22 DIAGNOSIS — I5022 Chronic systolic (congestive) heart failure: Secondary | ICD-10-CM | POA: Insufficient documentation

## 2022-01-22 NOTE — Progress Notes (Signed)
Daily Session Note  Patient Details  Name: [name] MRN: [id number] Date of Birth: July 26, 1970 Referring Provider:   Flowsheet Row Cardiac Rehab from 11/13/2021 in [facility] Cardiac and Pulmonary Rehab  Referring Provider [name] MD       Encounter Date: 01/22/2022  Check In:  Session Check In - 01/22/22 0958       Check-In   Supervising physician immediately available to respond to emergencies See telemetry face sheet for immediately available ER MD    Location [facility]-Cardiac & Pulmonary Rehab    Staff Present Carson Myrtle, BS, RRT, CPFT;Jerrin Recore, BS, Exercise Physiologist;Susanne Bice, RN, BSN, CCRP;Melissa Caiola, RDN, LDN    Virtual Visit No    Medication changes reported     No    Fall or balance concerns reported    No    Tobacco Cessation No Change    Warm-up and Cool-down Performed on first and last piece of equipment    Resistance Training Performed Yes    VAD Patient? Yes    PAD/SET Patient? No      VAD patient   Has back up controller? Yes    Has spare charged batteries? Yes    Has battery cables? Yes    Has compatible battery clips? Yes      Pain Assessment   Currently in Pain? No/denies    Multiple Pain Sites No                Social History   Tobacco Use  Smoking Status Never  Smokeless Tobacco Never    Goals Met:  Independence with exercise equipment Exercise tolerated well No report of concerns or symptoms today  Goals Unmet:  Not Applicable  Comments: Pt able to follow exercise prescription today without complaint.  Will continue to monitor for progression.    Dr. Emily Filbert is Medical Director for [place].  Dr. Ottie Glazier is Medical Director for [facility] Pulmonary Rehabilitation.

## 2022-01-23 ENCOUNTER — Ambulatory Visit (HOSPITAL_COMMUNITY): Payer: Self-pay | Admitting: Pharmacist

## 2022-01-23 LAB — POCT INR: INR: 1.3 — AB (ref 2.0–3.0)

## 2022-01-23 NOTE — Progress Notes (Signed)
LVAD INR 

## 2022-01-24 ENCOUNTER — Encounter: Payer: BC Managed Care – PPO | Admitting: *Deleted

## 2022-01-24 DIAGNOSIS — Z95811 Presence of heart assist device: Secondary | ICD-10-CM | POA: Diagnosis not present

## 2022-01-24 DIAGNOSIS — I5042 Chronic combined systolic (congestive) and diastolic (congestive) heart failure: Secondary | ICD-10-CM

## 2022-01-24 DIAGNOSIS — I5022 Chronic systolic (congestive) heart failure: Secondary | ICD-10-CM | POA: Diagnosis not present

## 2022-01-24 NOTE — Progress Notes (Signed)
Daily Session Note  Patient Details  Name: [name] MRN: [id number] Date of Birth: 1971/04/14 Referring Provider:   Flowsheet Row Cardiac Rehab from 11/13/2021 in [facility] Cardiac and Pulmonary Rehab  Referring Provider [name] MD       Encounter Date: 01/24/2022  Check In:  Session Check In - 01/24/22 1104       Check-In   Supervising physician immediately available to respond to emergencies See telemetry face sheet for immediately available ER MD    Location [facility]-Cardiac & Pulmonary Rehab    Staff Present Will Bonnet, RN,BC,MSN;Susanne Bice, RN, BSN, CCRP;Sahar Ryback Sherryll Burger, RN BSN    Virtual Visit No    Medication changes reported     Yes    Comments added lovenox injections    Fall or balance concerns reported    No    Warm-up and Cool-down Performed on first and last piece of equipment    Resistance Training Performed Yes    VAD Patient? Yes    PAD/SET Patient? No      VAD patient   Has back up controller? Yes    Has spare charged batteries? Yes    Has battery cables? Yes    Has compatible battery clips? Yes      Pain Assessment   Currently in Pain? No/denies                Social History   Tobacco Use  Smoking Status Never  Smokeless Tobacco Never    Goals Met:  Independence with exercise equipment Exercise tolerated well No report of concerns or symptoms today Strength training completed today  Goals Unmet:  Not Applicable  Comments: Pt able to follow exercise prescription today without complaint.  Will continue to monitor for progression.    Dr. Emily Filbert is Medical Director for [place].  Dr. Ottie Glazier is Medical Director for [facility] Pulmonary Rehabilitation.

## 2022-01-26 DIAGNOSIS — R5381 Other malaise: Secondary | ICD-10-CM | POA: Diagnosis not present

## 2022-01-27 ENCOUNTER — Encounter: Payer: Self-pay | Admitting: Internal Medicine

## 2022-01-27 ENCOUNTER — Encounter: Payer: BC Managed Care – PPO | Admitting: *Deleted

## 2022-01-27 DIAGNOSIS — I5042 Chronic combined systolic (congestive) and diastolic (congestive) heart failure: Secondary | ICD-10-CM | POA: Diagnosis not present

## 2022-01-27 DIAGNOSIS — Z95811 Presence of heart assist device: Secondary | ICD-10-CM | POA: Diagnosis not present

## 2022-01-27 DIAGNOSIS — I5022 Chronic systolic (congestive) heart failure: Secondary | ICD-10-CM | POA: Diagnosis not present

## 2022-01-27 NOTE — Progress Notes (Signed)
Daily Session Note  Patient Details  Name: [name] MRN: [id number] Date of Birth: 09-17-1970 Referring Provider:   Flowsheet Row Cardiac Rehab from 11/13/2021 in [facility] Cardiac and Pulmonary Rehab  Referring Provider [name] MD       Encounter Date: 01/27/2022  Check In:  Session Check In - 01/27/22 1032       Check-In   Supervising physician immediately available to respond to emergencies See telemetry face sheet for immediately available ER MD    Location [facility]-Cardiac & Pulmonary Rehab    Staff Present Heath Lark, RN, BSN, Jacklynn Bue, MS, ASCM CEP, Exercise Physiologist;Kelly Amedeo Plenty, BS, ACSM CEP, Exercise Physiologist    Virtual Visit No    Medication changes reported     No    Fall or balance concerns reported    No    Warm-up and Cool-down Performed on first and last piece of equipment    Resistance Training Performed Yes    VAD Patient? Yes    PAD/SET Patient? No      VAD patient   Has back up controller? Yes    Has spare charged batteries? Yes    Has battery cables? Yes    Has compatible battery clips? Yes      Pain Assessment   Currently in Pain? No/denies                Social History   Tobacco Use  Smoking Status Never  Smokeless Tobacco Never    Goals Met:  Independence with exercise equipment Exercise tolerated well No report of concerns or symptoms today  Goals Unmet:  Not Applicable  Comments: Pt able to follow exercise prescription today without complaint.  Will continue to monitor for progression.    Dr. Emily Filbert is Medical Director for [place].  Dr. Ottie Glazier is Medical Director for [facility] Pulmonary Rehabilitation.

## 2022-01-27 NOTE — Progress Notes (Signed)
PERIOPERATIVE PRESCRIPTION FOR IMPLANTED CARDIAC DEVICE PROGRAMMING  Patient Information: Name:  [name]  DOB:  [birth year]  MRN:  [id number]    Planned Procedure:  Robotic assisted laparoscopic unilateral oophorectomy, bilateral salpingectomy, possible bilateral salpingo-oophorectomy, possible robotic assist laparoscopic total hysterectomy, possible staging/laparotomy, possible Mirena intrauterine device insertion  Surgeon:  Dr. Jeral Pinch  Date of Procedure:  02/05/22  Cautery will be used.  Position during surgery:  Supine   Please send documentation back to:  [facility] (Fax # [phone])  Device Information:  Clinic EP Physician:  Virl Axe, MD   Device Type:  Defibrillator Manufacturer and Phone #:  Medtronic: [id number] Pacemaker Dependent?:  No. Date of Last Device Check:  11/26/21  Normal Device Function?:  Yes.    Electrophysiologist's Recommendations:  Have magnet available. Provide continuous ECG monitoring when magnet is used or reprogramming is to be performed.  Procedure should not interfere with device function.  No device programming or magnet placement needed.  Per Device Clinic Standing Orders, Simone Curia, RN  11:49 AM 01/27/2022

## 2022-01-28 ENCOUNTER — Inpatient Hospital Stay (HOSPITAL_COMMUNITY): Admission: RE | Admit: 2022-01-28 | Payer: BC Managed Care – PPO | Source: Ambulatory Visit

## 2022-01-28 ENCOUNTER — Ambulatory Visit (HOSPITAL_COMMUNITY)
Admission: RE | Admit: 2022-01-28 | Discharge: 2022-01-28 | Disposition: A | Discharge status: Home / Self Care | Payer: BC Managed Care – PPO | Source: Ambulatory Visit | Attending: Gynecologic Oncology | Admitting: Gynecologic Oncology

## 2022-01-28 ENCOUNTER — Telehealth: Payer: Self-pay | Admitting: *Deleted

## 2022-01-28 DIAGNOSIS — N83202 Unspecified ovarian cyst, left side: Secondary | ICD-10-CM | POA: Diagnosis not present

## 2022-01-28 DIAGNOSIS — N9489 Other specified conditions associated with female genital organs and menstrual cycle: Secondary | ICD-10-CM | POA: Diagnosis not present

## 2022-01-28 DIAGNOSIS — R1909 Other intra-abdominal and pelvic swelling, mass and lump: Secondary | ICD-10-CM | POA: Diagnosis not present

## 2022-01-28 NOTE — Telephone Encounter (Signed)
Patient called and stated "I just received a call that my appt for this morning was canceled  and the surgery was moved from 8/16 to 8/14. I am confused." Per Melissa APP explained to the patient that the surgery is on 8/16 but her hospital admit date is 8/14. Explained to call her cardiology office about her pre admission appt being canceled, our office didn't cancel the appt. Explained that the appt may have possible been cancel do her hospital admission and the test would be done then.   Also reminded the patient about her [name] appt today.   Patient verbalized understanding

## 2022-01-29 ENCOUNTER — Encounter: Payer: BC Managed Care – PPO | Admitting: *Deleted

## 2022-01-29 ENCOUNTER — Encounter (INDEPENDENT_AMBULATORY_CARE_PROVIDER_SITE_OTHER): Payer: Self-pay

## 2022-01-29 ENCOUNTER — Other Ambulatory Visit: Payer: Self-pay | Admitting: Gynecologic Oncology

## 2022-01-29 ENCOUNTER — Encounter: Payer: Self-pay | Admitting: *Deleted

## 2022-01-29 ENCOUNTER — Encounter: Payer: Self-pay | Admitting: Gynecologic Oncology

## 2022-01-29 DIAGNOSIS — I5022 Chronic systolic (congestive) heart failure: Secondary | ICD-10-CM | POA: Diagnosis not present

## 2022-01-29 DIAGNOSIS — Z95811 Presence of heart assist device: Secondary | ICD-10-CM | POA: Diagnosis not present

## 2022-01-29 DIAGNOSIS — I5042 Chronic combined systolic (congestive) and diastolic (congestive) heart failure: Secondary | ICD-10-CM

## 2022-01-29 DIAGNOSIS — N939 Abnormal uterine and vaginal bleeding, unspecified: Secondary | ICD-10-CM

## 2022-01-29 MED ORDER — MEDROXYPROGESTERONE ACETATE 10 MG PO TABS: 10.0000 mg | ORAL_TABLET | ORAL | 0 refills | Status: DC

## 2022-01-29 NOTE — Progress Notes (Signed)
Daily Session Note  Patient Details  Name: [name] MRN: [id number] Date of Birth: 25-Feb-1971 Referring Provider:   Flowsheet Row Cardiac Rehab from 11/13/2021 in [facility] Cardiac and Pulmonary Rehab  Referring Provider [name] MD       Encounter Date: 01/29/2022  Check In:  Session Check In - 01/29/22 1656       Check-In   Supervising physician immediately available to respond to emergencies See telemetry face sheet for immediately available ER MD    Location [facility]-Cardiac & Pulmonary Rehab    Staff Present Nyoka Cowden, RN, BSN, Fenton Foy, BS, Exercise Physiologist;Susanne Bice, RN, BSN, CCRP;Melissa Boykins, RDN, LDN;Jessica [place], MA, RCEP, CCRP, [place], MS, ASCM CEP, Exercise Physiologist    Virtual Visit No    Medication changes reported     No    Fall or balance concerns reported    No    Tobacco Cessation No Change    Warm-up and Cool-down Performed on first and last piece of equipment    Resistance Training Performed Yes    VAD Patient? No    PAD/SET Patient? No      VAD patient   Has back up controller? Yes    Has spare charged batteries? Yes    Has battery cables? Yes    Has compatible battery clips? Yes      Pain Assessment   Currently in Pain? No/denies                Social History   Tobacco Use  Smoking Status Never  Smokeless Tobacco Never    Goals Met:  Independence with exercise equipment Exercise tolerated well No report of concerns or symptoms today  Goals Unmet:  Not Applicable  Comments: Pt able to follow exercise prescription today without complaint.  Will continue to monitor for progression.    Dr. Emily Filbert is Medical Director for [place].  Dr. Ottie Glazier is Medical Director for [facility] Pulmonary Rehabilitation.

## 2022-01-29 NOTE — Progress Notes (Signed)
Called patient.  She is overall doing well.  Provera significantly helped with her bleeding although it never completely stopped.  She notes brown bleeding for a period of time.  Now thinks that she is having her normal menses.  She also had to increase her Coumadin dose and take Lovenox recently, so thinks she may be bleeding more because of that.  Discussed options at the time of surgery regarding hysterectomy.  While endometrial biopsy recently was overall reassuring, the risk is that if hysterectomy is not performed at the time of surgery, she may have additional bleeding events, which is not ideal in the setting of her cardiac history.  She understands that the risk related to surgery and both intraoperative and postoperative bleeding increases if hysterectomy is performed.  She is thought about this decision, I would like to move forward with total hysterectomy and BSO regardless of pathology findings.  Jeral Pinch MD Gynecologic Oncology

## 2022-01-29 NOTE — Progress Notes (Signed)
Cardiac Individual Treatment Plan  Patient Details  Name: [name] MRN: [id number] Date of Birth: 23-Apr-1971 Referring Provider:   Flowsheet Row Cardiac Rehab from 11/13/2021 in [facility] Cardiac and Pulmonary Rehab  Referring Provider [name] MD       Initial Encounter Date:  Flowsheet Row Cardiac Rehab from 11/13/2021 in [facility] Cardiac and Pulmonary Rehab  Date 11/13/21       Visit Diagnosis: LVAD (left ventricular assist device) present ([place])  Chronic combined systolic and diastolic heart failure ([place])  Patient's Home Medications on Admission:  Current Outpatient Medications:    aspirin EC 81 MG tablet, Take 1 tablet (81 mg total) by mouth daily. Swallow whole., Disp: 90 tablet, Rfl: 3   enoxaparin (LOVENOX) 40 MG/0.4ML injection, Inject 0.4 mLs (40 mg total) into the skin every 12 (twelve) hours. (Patient not taking: Reported on 01/21/2022), Disp: 24 mL, Rfl: 0   medroxyPROGESTERone (PROVERA) 10 MG tablet, Take 1 tablet (10 mg total) by mouth daily. (Patient taking differently: Take 10-20 mg by mouth See admin instructions. Take 10 mg daily, may take a second 10 mg dose as needed for heavy bleeding), Disp: 30 tablet, Rfl: 0   methimazole (TAPAZOLE) 10 MG tablet, Take 1 tablet (10 mg total) by mouth 2 (two) times daily., Disp: 180 tablet, Rfl: 1   potassium chloride SA (KLOR-CON M) 20 MEQ tablet, Take 3 tablets (60 mEq total) by mouth 2 (two) times daily. (Patient taking differently: Take 60 mEq by mouth daily.), Disp: 180 tablet, Rfl: 11   predniSONE (DELTASONE) 10 MG tablet, Take 1 tablet (10 mg total) by mouth daily with breakfast., Disp: 90 tablet, Rfl: 1   quiniDINE gluconate 324 MG CR tablet, Take 1 tablet (324 mg total) by mouth 2 (two) times daily., Disp: 60 tablet, Rfl: 0   rosuvastatin (CRESTOR) 40 MG tablet, Take 1 tablet (40 mg total) by mouth at bedtime., Disp: 30 tablet, Rfl: 0   sildenafil (REVATIO) 20 MG tablet, Take 1 tablet (20 mg total) by mouth 3 (three) times  daily., Disp: 90 tablet, Rfl: 6   spironolactone (ALDACTONE) 25 MG tablet, Take 1 tablet (25 mg total) by mouth daily., Disp: 45 tablet, Rfl: 3   torsemide (DEMADEX) 20 MG tablet, Take 1 tablet (20 mg total) by mouth every other day., Disp: 45 tablet, Rfl: 3   warfarin (COUMADIN) 2 MG tablet, Take 2 mg (1 tabs) every Tuesday/Thursday/Saturday and 4 mg (2 tabs) all other days or as directed by the [facility] (Patient taking differently: Take 2-4 mg by mouth See admin instructions. Take 4 mg on Mon, Wed, Fri, and Sun Take 2 mg on Tues, Thurs, and Sat), Disp: 180 tablet, Rfl: 5   zinc gluconate 50 MG tablet, Take 1 tablet (50 mg total) by mouth daily., Disp: 30 tablet, Rfl: 6  Past Medical History: Past Medical History:  Diagnosis Date   Automatic implantable cardioverter-defibrillator in situ    Chronic CHF (congestive heart failure) (HCC)    a. EF 15-20% b. RHC (09/2013) RA 14, RV 57/22, PA 64/36 (48), PCWP 18, FIck CO/CI 3.7/1.6, PVR 8.1 WU, PA sat 47%    History of hypothyroidism    History of stomach ulcers    Hyperthyroidism    Hypotension    Morbid obesity ([place])    Myocardial infarction ([place]) 08/2013   Nocturnal dyspnea    Nonischemic cardiomyopathy (HCC)    Sinus tachycardia    Sleep apnea    Snoring-prob OSA 09/04/2011  [employer] ICD lead RECALL  [id number]    Stroke ([place]) 2021   some residual right-sided weakness   UARS (upper airway resistance syndrome) 09/04/2011   HST 12/2013:  AHI 4/hr (numerous episodes of airflow reduction that did not have concomitant desaturation)     Tobacco Use: Social History   Tobacco Use  Smoking Status Never  Smokeless Tobacco Never    Labs: Review Flowsheet  More data exists      Latest Ref Rng & Units 09/17/2021 09/18/2021 09/19/2021 09/20/2021 12/24/2021  Labs for ITP Cardiac and Pulmonary Rehab  TCO2 22 - 32 mmol/L - - - - 21   O2 Saturation % 70.5  95.4  63.7  67.5  66.6  -     Exercise Target Goals: Exercise  Program Goal: Individual exercise prescription set using results from initial 6 min walk test and THRR while considering  patient's activity barriers and safety.   Exercise Prescription Goal: Initial exercise prescription builds to 30-45 minutes a day of aerobic activity, 2-3 days per week.  Home exercise guidelines will be given to patient during program as part of exercise prescription that the participant will acknowledge.   Education: Aerobic Exercise: - Group verbal and visual presentation on the components of exercise prescription. Introduces F.I.T.T principle from ACSM for exercise prescriptions.  Reviews F.I.T.T. principles of aerobic exercise including progression. Written material given at graduation. Flowsheet Row Cardiac Rehab from 10/17/2020 in [facility] Cardiac and Pulmonary Rehab  Date 08/08/20  Educator [facility]  Instruction Review Code 1- Verbalizes Understanding       Education: Resistance Exercise: - Group verbal and visual presentation on the components of exercise prescription. Introduces F.I.T.T principle from ACSM for exercise prescriptions  Reviews F.I.T.T. principles of resistance exercise including progression. Written material given at graduation. Flowsheet Row Cardiac Rehab from 10/17/2020 in [facility] Cardiac and Pulmonary Rehab  Date 10/17/20  Educator [facility]  Instruction Review Code 1- Verbalizes Understanding        Education: Exercise & Equipment Safety: - Individual verbal instruction and demonstration of equipment use and safety with use of the equipment. Flowsheet Row Cardiac Rehab from 11/13/2021 in [facility] Cardiac and Pulmonary Rehab  Education need identified 11/13/21  Date 11/13/21  Educator [facility]  Instruction Review Code 1- Verbalizes Understanding       Education: Exercise Physiology & General Exercise Guidelines: - Group verbal and written instruction with models to review the exercise physiology of the cardiovascular system and associated critical values.  Provides general exercise guidelines with specific guidelines to those with heart or lung disease.  Flowsheet Row Cardiac Rehab from 10/17/2020 in [facility] Cardiac and Pulmonary Rehab  Date 10/03/20  Educator [facility]  Instruction Review Code 1- Verbalizes Understanding       Education: Flexibility, Balance, Mind/Body Relaxation: - Group verbal and visual presentation with interactive activity on the components of exercise prescription. Introduces F.I.T.T principle from ACSM for exercise prescriptions. Reviews F.I.T.T. principles of flexibility and balance exercise training including progression. Also discusses the mind body connection.  Reviews various relaxation techniques to help reduce and manage stress (i.e. Deep breathing, progressive muscle relaxation, and visualization). Balance handout provided to take home. Written material given at graduation. Flowsheet Row Cardiac Rehab from 10/17/2020 in [facility] Cardiac and Pulmonary Rehab  Date 08/22/20  Educator [facility]  Instruction Review Code 1- Verbalizes Understanding       Activity Barriers & Risk Stratification:  Activity Barriers & Cardiac Risk Stratification - 11/13/21 1234       Activity Barriers &  Cardiac Risk Stratification   Activity Barriers Deconditioning;Muscular Weakness;Shortness of Breath;Decreased Ventricular Function;Other (comment)    Comments LVAD    Cardiac Risk Stratification High             6 Minute Walk:  6 Minute Walk     Row Name 11/13/21 1245         6 Minute Walk   Phase Initial     Distance 985 feet     Walk Time 6 minutes     # of Rest Breaks 0     MPH 1.86     METS 2.68     RPE 10     Perceived Dyspnea  1     VO2 Peak 9.41     Symptoms Yes (comment)     Comments Slight SOB, unsteadiness with balance     Resting HR 81 bpm     Resting BP 103/64  automated BP cuff     Resting Oxygen Saturation  97 %     Exercise Oxygen Saturation  during 6 min walk 96 %     Max Ex. HR 105 bpm     Max Ex. BP 112/83   automated BP cuff     2 Minute Post BP 85/74  automated BP cuff; On entresto and denied dizziness/lightheadedness              Oxygen Initial Assessment:   Oxygen Re-Evaluation:   Oxygen Discharge (Final Oxygen Re-Evaluation):   Initial Exercise Prescription:  Initial Exercise Prescription - 11/13/21 1200       Date of Initial Exercise RX and Referring Provider   Date 11/13/21    Referring Provider [name] MD      Oxygen   Maintain Oxygen Saturation 88% or higher      Recumbant Bike   Level 1    RPM 60    Watts 22    Minutes 15    METs 2.6      NuStep   Level 1    SPM 80    Minutes 15    METs 2.6      REL-XR   Level 1    Speed 50    Minutes 15    METs 2.6      Track   Laps 21    Minutes 15    METs 2.14      Prescription Details   Frequency (times per week) 3    Duration Progress to 30 minutes of continuous aerobic without signs/symptoms of physical distress      Intensity   THRR 40-80% of Max Heartrate 116- 152    Ratings of Perceived Exertion 11-13    Perceived Dyspnea 0-4      Progression   Progression Continue to progress workloads to maintain intensity without signs/symptoms of physical distress.      Resistance Training   Training Prescription Yes    Weight 3 lb    Reps 10-15             Perform Capillary Blood Glucose checks as needed.  Exercise Prescription Changes:   Exercise Prescription Changes     Row Name 11/13/21 1200 11/20/21 1400 12/02/21 1600 12/16/21 0800 12/30/21 0900     Response to Exercise   Blood Pressure (Admit) 103/64  automated BP cuff 93/81 91/56 93/80 103/57   Blood Pressure (Exercise) 112/83  automated BP cuff --  76 Doppler 107/39 114/82 --   Blood Pressure (Exit) 85/74  automated BP cuff;  On entresto and denied dizziness/lightheadedness --  72 Doppler 116/83 114/82 78/65   Heart Rate (Admit) 71 bpm 91 bpm 103 bpm 92 bpm 100 bpm   Heart Rate (Exercise) 105 bpm 116 bpm 113 bpm 113 bpm 118 bpm    Heart Rate (Exit) 80 bpm 98 bpm 90 bpm 83 bpm 100 bpm   Oxygen Saturation (Admit) 97 % -- -- -- --   Oxygen Saturation (Exercise) 96 % -- -- -- --   Oxygen Saturation (Exit) 97 % -- -- -- --   Rating of Perceived Exertion (Exercise) _0 Perceived Dyspnea (Exercise) 1 -- -- -- --   Symptoms Slight SOB, unsteady gait -- SOB none none   Comments walk test results Today was her first day at Rehab -- -- --   Duration -- -- Continue with 30 min of aerobic exercise without signs/symptoms of physical distress. Continue with 30 min of aerobic exercise without signs/symptoms of physical distress. Continue with 30 min of aerobic exercise without signs/symptoms of physical distress.   Intensity -- -- THRR unchanged THRR unchanged THRR unchanged     Progression   Progression -- -- Continue to progress workloads to maintain intensity without signs/symptoms of physical distress. Continue to progress workloads to maintain intensity without signs/symptoms of physical distress. Continue to progress workloads to maintain intensity without signs/symptoms of physical distress.   Average METs -- -- 2.07 2.47 2.72     Resistance Training   Training Prescription -- -- Yes Yes Yes   Weight -- -- 3 lb 3 lb 3 lb   Reps -- -- 10-15 10-15 10-15     Interval Training   Interval Training -- -- No No No     Treadmill   MPH -- -- -- 1.5 1.7   Grade -- -- -- 0 2   Minutes -- -- -- 15 15   METs -- -- -- 2.15 2.77     Recumbant Bike   Level -- -- 1._1 Watts -- -- _2 Minutes -- -- _3 METs -- -- 2.45 2.59 2.6     NuStep   Level -- -- 1 5 --   Minutes -- -- 15 15 --   METs -- -- 2.5 2.8 --     REL-XR   Level -- -- _4 Minutes -- -- _5 METs -- -- 1.4 3.1 3.1     T5 Nustep   Level -- -- 3 -- --   Minutes -- -- 15 -- --   METs -- -- 1.9 -- --     Track   Laps -- -- 20 -- --   Minutes -- -- 15 -- --   METs -- -- 2.09 -- --     Home Exercise Plan   Plans  to continue exercise at -- -- -- -- Home (comment)  walking, treadmill, staff videos   Frequency -- -- -- -- Add 2 additional days to program exercise sessions.   Initial Home Exercises Provided -- -- -- -- 12/18/21     Oxygen   Maintain Oxygen Saturation -- -- 88% or higher 88% or higher 88% or higher    Row Name 01/13/22 1500 01/27/22 0900           Response to Exercise   Blood Pressure (Admit) 98/70 107/89      Blood Pressure (Exit) 106/84 109/73  Heart Rate (Admit) 94 bpm 59 bpm      Heart Rate (Exercise) 133 bpm 118 bpm      Heart Rate (Exit) 84 bpm 70 bpm      Rating of Perceived Exertion (Exercise) 13 13      Symptoms none none      Duration Continue with 30 min of aerobic exercise without signs/symptoms of physical distress. Continue with 30 min of aerobic exercise without signs/symptoms of physical distress.      Intensity THRR unchanged THRR unchanged        Progression   Progression Continue to progress workloads to maintain intensity without signs/symptoms of physical distress. Continue to progress workloads to maintain intensity without signs/symptoms of physical distress.      Average METs 2.82 2.96        Resistance Training   Training Prescription Yes Yes      Weight 3 lb 3 lb      Reps 10-15 10-15        Interval Training   Interval Training No No        Treadmill   MPH 2 2      Grade 1 1      Minutes 15 15      METs 2.81 2.81        Recumbant Bike   Level 2 2      Watts 23 23      Minutes 15 15      METs 2.67 2.66        NuStep   Level 6 4      Minutes 15 15      METs 3.3 3.1        REL-XR   Level 3 6      Minutes 15 15      METs 3 4.2        Track   Laps -- 27      Minutes -- 15      METs -- 2.47        Home Exercise Plan   Plans to continue exercise at Home (comment)  walking, treadmill, staff videos Home (comment)  walking, treadmill, staff videos      Frequency Add 2 additional days to program exercise sessions. Add 2  additional days to program exercise sessions.      Initial Home Exercises Provided 12/18/21 12/18/21        Oxygen   Maintain Oxygen Saturation 88% or higher 88% or higher               Exercise Comments:   Exercise Comments     Row Name 11/20/21 1002           Exercise Comments First full day of exercise!  Patient was oriented to gym and equipment including functions, settings, policies, and procedures.  Patient's individual exercise prescription and treatment plan were reviewed.  All starting workloads were established based on the results of the 6 minute walk test done at initial orientation visit.  The plan for exercise progression was also introduced and progression will be customized based on patient's performance and goals.                Exercise Goals and Review:   Exercise Goals     Row Name 11/13/21 1258             Exercise Goals   Increase Physical Activity Yes       Intervention Provide advice, education,  support and counseling about physical activity/exercise needs.;Develop an individualized exercise prescription for aerobic and resistive training based on initial evaluation findings, risk stratification, comorbidities and participant's personal goals.       Expected Outcomes Short Term: Attend rehab on a regular basis to increase amount of physical activity.;Long Term: Add in home exercise to make exercise part of routine and to increase amount of physical activity.;Long Term: Exercising regularly at least 3-5 days a week.       Increase Strength and Stamina Yes       Intervention Provide advice, education, support and counseling about physical activity/exercise needs.;Develop an individualized exercise prescription for aerobic and resistive training based on initial evaluation findings, risk stratification, comorbidities and participant's personal goals.       Expected Outcomes Short Term: Increase workloads from initial exercise prescription for  resistance, speed, and METs.;Short Term: Perform resistance training exercises routinely during rehab and add in resistance training at home;Long Term: Improve cardiorespiratory fitness, muscular endurance and strength as measured by increased METs and functional capacity (6MWT)       Able to understand and use rate of perceived exertion (RPE) scale Yes       Intervention Provide education and explanation on how to use RPE scale       Expected Outcomes Short Term: Able to use RPE daily in rehab to express subjective intensity level;Long Term:  Able to use RPE to guide intensity level when exercising independently       Able to understand and use Dyspnea scale Yes       Intervention Provide education and explanation on how to use Dyspnea scale       Expected Outcomes Short Term: Able to use Dyspnea scale daily in rehab to express subjective sense of shortness of breath during exertion;Long Term: Able to use Dyspnea scale to guide intensity level when exercising independently       Knowledge and understanding of Target Heart Rate Range (THRR) Yes       Intervention Provide education and explanation of THRR including how the numbers were predicted and where they are located for reference       Expected Outcomes Short Term: Able to state/look up THRR;Short Term: Able to use daily as guideline for intensity in rehab;Long Term: Able to use THRR to govern intensity when exercising independently       Able to check pulse independently Yes       Intervention Provide education and demonstration on how to check pulse in carotid and radial arteries.;Review the importance of being able to check your own pulse for safety during independent exercise       Expected Outcomes Short Term: Able to explain why pulse checking is important during independent exercise;Long Term: Able to check pulse independently and accurately       Understanding of Exercise Prescription Yes       Intervention Provide education, explanation,  and written materials on patient's individual exercise prescription       Expected Outcomes Short Term: Able to explain program exercise prescription;Long Term: Able to explain home exercise prescription to exercise independently                Exercise Goals Re-Evaluation :  Exercise Goals Re-Evaluation     Row Name 11/20/21 1002 11/20/21 1409 12/02/21 1600 12/16/21 0830 12/18/21 1007     Exercise Goal Re-Evaluation   Exercise Goals Review Increase Physical Activity;Able to understand and use rate of perceived exertion (RPE) scale;Knowledge and understanding  of Target Heart Rate Range (THRR);Understanding of Exercise Prescription;Increase Strength and Stamina;Able to understand and use Dyspnea scale;Able to check pulse independently Increase Physical Activity;Understanding of Exercise Prescription;Increase Strength and Stamina Increase Physical Activity;Understanding of Exercise Prescription;Increase Strength and Stamina Increase Physical Activity;Understanding of Exercise Prescription;Increase Strength and Stamina Increase Physical Activity;Understanding of Exercise Prescription;Increase Strength and Stamina;Able to understand and use rate of perceived exertion (RPE) scale;Able to understand and use Dyspnea scale;Knowledge and understanding of Target Heart Rate Range (THRR);Able to check pulse independently   Comments Reviewed RPE and dyspnea scales, THR and program prescription with pt today.  Pt voiced understanding and was given a copy of goals to take home. [name] did well on her first day in the program. Pt will benefit from continuing to attend rehab. [name] is off to a great start in rehab.  Some days we have been able to use automatic cuff and some days need to use dopplar for blood pressures.  She has only attended three full sessions as she had to miss a few appointments due to an alarm on her VAD but was found to be to dry and meds were adjusted.  She is up to level 1.5 on the bike and  level 3 on T5 NuStep. We will conitnue to monitor her progress. [name] is doing well in rehab. She has done well on the treadmill at a speed of 1.5 mph. She also increased to level 5 on the T4 machine. [name] has tolerated using 3 lb hand weights for resistance training as well. We will continue to monitor her progress in the program. Updated home exercise with pt today.  Pt plans to walk and use treadmill and bike at home for exercise.  Reviewed THR, pulse, RPE, sign and symptoms, pulse oximetery and when to call 911 or MD.  Also review LVAD parameters and reminded of precautions for it with exercise at home.  Also discussed weather considerations and indoor options.  Pt voiced understanding.   Expected Outcomes Short: Use RPE daily to regulate intensity. Long: Follow program prescription in THR. Short: Use RPE daily to regulate intensity. Long: Follow program prescription in THR. Short: Continue ot attend rehab regularly Long: Continue to follow program prescription Short: Continue to increase laps on the track Long: Continue to improve strength and stamina. Short: Start to add exercise back in at home Long: Continue to rebuild stamina.    [place] Name 12/30/21 8299 01/13/22 1519 01/15/22 1012 01/27/22 0946       Exercise Goal Re-Evaluation   Exercise Goals Review Increase Physical Activity;Increase Strength and Stamina;Understanding of Exercise Prescription Increase Physical Activity;Increase Strength and Stamina;Understanding of Exercise Prescription Increase Physical Activity;Increase Strength and Stamina;Understanding of Exercise Prescription Increase Physical Activity;Increase Strength and Stamina;Understanding of Exercise Prescription    Comments [name] has been doing well in rehab.  She missed part of last week by needing iron infusions and med adjustments.  She has worked her way up to 2% grade on the treadmill.  We will encouarge her to try 4 lb weights and continue to montior her progress. [name]  continues to do well in rehab. She has increased her levels on both the recumbent bike and T4 Nustep. RPEs are in appropriate range. She would benefit from using 4 lbs for handweights. Will continue to monitor. [name] is doing well in rehab.  She is walking on the treadmill at home for 20-30 min each day or go to the stores to walk.  She feels likes she is recovering her strength  and stamina.  She has noticed a big change since she started rehab.  When she started, she was not able to walk up her stairs and now she is able to go up them without a problem. [name] continues to do well in rehab. She recently improved her overall average MET level to 2.96 METs. She increased her laps on the track to 27 laps as well. She also improved from level 3 to level 6 on the XR. We will continue to monitor [name] progress in the program.    Expected Outcomes Short: Try 4 lb hand weights once back to exercise again Long; Continue to build stamina Short: Increase to 4 lbs for handweights Long: Continue to increase overall MET level Short: Continue to use treadmill at home Long; Continue to improve stamina Short: Continue to increase workload on the treadmill. Long: Continue to improve strength and stamina.             Discharge Exercise Prescription (Final Exercise Prescription Changes):  Exercise Prescription Changes - 01/27/22 0900       Response to Exercise   Blood Pressure (Admit) 107/89    Blood Pressure (Exit) 109/73    Heart Rate (Admit) 59 bpm    Heart Rate (Exercise) 118 bpm    Heart Rate (Exit) 70 bpm    Rating of Perceived Exertion (Exercise) 13    Symptoms none    Duration Continue with 30 min of aerobic exercise without signs/symptoms of physical distress.    Intensity THRR unchanged      Progression   Progression Continue to progress workloads to maintain intensity without signs/symptoms of physical distress.    Average METs 2.96      Resistance Training   Training Prescription Yes     Weight 3 lb    Reps 10-15      Interval Training   Interval Training No      Treadmill   MPH 2    Grade 1    Minutes 15    METs 2.81      Recumbant Bike   Level 2    Watts 23    Minutes 15    METs 2.66      NuStep   Level 4    Minutes 15    METs 3.1      REL-XR   Level 6    Minutes 15    METs 4.2      Track   Laps 27    Minutes 15    METs 2.47      Home Exercise Plan   Plans to continue exercise at Home (comment)   walking, treadmill, staff videos   Frequency Add 2 additional days to program exercise sessions.    Initial Home Exercises Provided 12/18/21      Oxygen   Maintain Oxygen Saturation 88% or higher             Nutrition:  Target Goals: Understanding of nutrition guidelines, daily intake of sodium <1556m, cholesterol <2075m calories 30% from fat and 7% or less from saturated fats, daily to have 5 or more servings of fruits and vegetables.  Education: All About Nutrition: -Group instruction provided by verbal, written material, interactive activities, discussions, models, and posters to present general guidelines for heart healthy nutrition including fat, fiber, MyPlate, the role of sodium in heart healthy nutrition, utilization of the nutrition label, and utilization of this knowledge for meal planning. Follow up email sent as well. Written material given at  graduation. Flowsheet Row Cardiac Rehab from 11/13/2021 in [facility] Cardiac and Pulmonary Rehab  Education need identified 11/13/21       Biometrics:  Pre Biometrics - 11/13/21 1234       Pre Biometrics   Height 5' 4" (1.626 m)    Weight 230 lb 12.8 oz (104.7 kg)   Weighed with LVAD   BMI (Calculated) 39.6    Single Leg Stand 30 seconds              Nutrition Therapy Plan and Nutrition Goals:  Nutrition Therapy & Goals - 12/09/21 0912       Nutrition Therapy   Diet Heart healthy, low Na    Drug/Food Interactions Statins/Certain Fruits;Coumadin/Vit K    Protein (specify units)  100g    Fiber 25 grams    Whole Grain Foods 3 servings    Saturated Fats 12 max. grams    Fruits and Vegetables 8 servings/day    Sodium 1.5 grams      Personal Nutrition Goals   Nutrition Goal ST: limit sugar-sweetened beverages, try citris or vinegar with meals to help with flavor, include vitamin K rich food consistently - work with health care team LT: continue with mechanical eating to meet nutritional needs, follow MyPlate guidelines for meals, limit added sugar <24g/day    Comments 51 y.o. F admitted to cardiac rehab  for chronic combined systolic and diastolic HF. PMHx includes hypotension, CVA (2021), NICM EF 20-25%, a.fib, UARS, hyperthyroidism, LVAD (08/2021), moderate malnutrition (08/2021), MI (2015). Relevant medications includes methimazole, crestor, prednisone, warfarin, zinc, torsemide PRN. PYP Score: 70. Vegetables & Fruits 7/12. Breads, Grains & Cereals 8/12. Red & Processed Meat 9/12. Poultry 2/2. Fish & Shellfish 2/4. Beans, Nuts & Seeds 3/4. Milk & Dairy Foods 6/6. Toppings, Oils, Seasonings & Salt 13/20. Sweets, Snacks & Restaurant Food 12/14. Beverages 8/10.  Breakfast: 1 egg with 1.5 pieces of bacon or [place] bacon Snacks: almonds, skinny pop, watermelon Lunch: same as dinner Snacks: almonds, skinny pop, watermelon Dinner: pork tenderloin, chicken breast, occasional steak - cook to have for the week. They have a garden so she will eat vegetables from her garden - she is limiting her vitamin K rich food. She will cook with some butter but mostly she will use pam spray. She rarely has fried foods, but will sometimes have fried chicken. She does not salt her food. Drinks: lemonade, water, OJ, sometimes cranberry-watermelon juice. She reports that she is practicing mechanical eating as she is not feeling her hunger cues. She is also struggling with a metallic taste in her mouth which she reports is getting better. Encouraged to have protein rich foods at each meal and spoke to her higher  protein needs. Encouraged her to limit her sugar sweetened beverages and include more nutrient dense foods such as peanut butter as this is something she enjoys. She would like to lose weight - discussed how it is important she meet her nutriitonal needs to ensure proper nutrition and limit muscle loss; also discussed the body's compensatory mechanisms. She reorts limiting vitamin K rich food, discussed updated recommendations to include these foods consistently and in similar amounts and to work with health care team. Reviewed heart healthy eating.      Intervention Plan   Intervention Prescribe, educate and counsel regarding individualized specific dietary modifications aiming towards targeted core components such as weight, hypertension, lipid management, diabetes, heart failure and other comorbidities.    Expected Outcomes Short Term Goal: Understand basic principles of dietary  content, such as calories, fat, sodium, cholesterol and nutrients.;Short Term Goal: A plan has been developed with personal nutrition goals set during dietitian appointment.;Long Term Goal: Adherence to prescribed nutrition plan.             Nutrition Assessments:  MEDIFICTS Score Key: ?70 Need to make dietary changes  40-70 Heart Healthy Diet ? 40 Therapeutic Level Cholesterol Diet  Flowsheet Row Cardiac Rehab from 11/13/2021 in [facility] Cardiac [facility]monary Rehab  Picture Your Plate Total Score on Admission 70      Picture Your Plate Scores: <70 Unhealthy dietary pattern with much room for improvement. 41-50 Dietary pattern unlikely to meet recommendations for good health and room for improvement. 51-60 More healthful dietary pattern, with some room for improvement.  >60 Healthy dietary pattern, although there may be some specific behaviors that could be improved.    Nutrition Goals Re-Evaluation:  Nutrition Goals Re-Evaluation     [place]in Name 01/15/22 1022             Goals   Nutrition Goal ST: limit  sugar-sweetened beverages, try citris or vinegar with meals to help with flavor, include vitamin K rich food consistently - work with health care team LT: continue with mechanical eating to meet nutritional needs, follow MyPlate guidelines for meals, limit added sugar <24g/day       Comment [name] is doing well with her diet.  Her weight went up some but she is hoping it will start to come down again.  She has cut back on sweeted drinks.  She is using diet cranberry juice and we talked about diluting it more too.  She also bought a water diffuser and using lemon and mint to flavor.  She is working on adding in more vitamin K foods daily to try to help maintain her INR levels.  She is still eating becuase she has too as she doesn't have an appetite.  She still gets the metallic taste from some food and will use plastic utensils to help as well.  She eats when its time to eat just because she knkows she is supposed to eat.       Expected Outcome Short: Continue to work on weight loss and vitamin K food Long: Continue to eat to eat                Nutrition Goals Discharge (Final Nutrition Goals Re-Evaluation):  Nutrition Goals Re-Evaluation - 01/15/22 1022       Goals   Nutrition Goal ST: limit sugar-sweetened beverages, try citris or vinegar with meals to help with flavor, include vitamin K rich food consistently - work with health care team LT: continue with mechanical eating to meet nutritional needs, follow MyPlate guidelines for meals, limit added sugar <24g/day    Comment [name] is doing well with her diet.  Her weight went up some but she is hoping it will start to come down again.  She has cut back on sweeted drinks.  She is using diet cranberry juice and we talked about diluting it more too.  She also bought a water diffuser and using lemon and mint to flavor.  She is working on adding in more vitamin K foods daily to try to help maintain her INR levels.  She is still eating becuase she has too  as she doesn't have an appetite.  She still gets the metallic taste from some food and will use plastic utensils to help as well.  She eats when its time  to eat just because she knkows she is supposed to eat.    Expected Outcome Short: Continue to work on weight loss and vitamin K food Long: Continue to eat to eat             Psychosocial: Target Goals: Acknowledge presence or absence of significant depression and/or stress, maximize coping skills, provide positive support system. Participant is able to verbalize types and ability to use techniques and skills needed for reducing stress and depression.   Education: Stress, Anxiety, and Depression - Group verbal and visual presentation to define topics covered.  Reviews how body is impacted by stress, anxiety, and depression.  Also discusses healthy ways to reduce stress and to treat/manage anxiety and depression.  Written material given at graduation. Flowsheet Row Cardiac Rehab from 10/17/2020 in [facility] Cardiac [facility] Pul[facility] Rehab  Education need identified 08/06/20  Date 09/26/20  Educator [facility]  Instruction Review Code 1- [employer] Understanding       Education: Sleep Hygiene -Provides group verbal and written instruction about how sleep can affect your health.  Define sleep hygiene, discuss sleep cycles and impact of sleep habits. Review good sleep hygiene tips.    Initial Review & Psychosocial Screening:  Initial Psych Review & Screening - 11/01/21 1410       Initial Review   Current issues with None Identified      Family Dynamics   Good Support System? Yes   husband and 38 and 23 years children     Barriers   Psychosocial barriers to participate in program There are no identifiable barriers or psychosocial needs.      Screening Interventions   Interventions Encouraged to exercise    Expected Outcomes Short Term goal: Utilizing psychosocial counselor, staff and physician to assist with identification of specific Stressors or  current issues interfering with healing process. Setting desired goal for each stressor or current issue identified.;Long Term Goal: Stressors or current issues are controlled or eliminated.;Short Term goal: Identification and review with participant of any Quality of Life or Depression concerns found by scoring the questionnaire.;Long Term goal: The participant improves quality of Life and PHQ9 Scores as seen by post scores and/or verbalization of changes             Quality of Life Scores:   Quality of Life - 11/13/21 1230       Quality of Life   Select Quality of Life      Quality of Life Scores   Health/Function Pre 27.57 %    Socioeconomic Pre 29.64 %    Psych/Spiritual Pre 30 %    Family Pre 30 %    GLOBAL Pre 28.85 %            Scores of 19 and below usually indicate a poorer quality of life in these areas.  A difference of  2-3 points is a clinically meaningful difference.  A difference of 2-3 points in the total score of the Quality of Life Index has been associated with significant improvement in overall quality of life, self-image, physical symptoms, and general health in studies assessing change in quality of life.  PHQ-9: Review Flowsheet  More data exists      11/13/2021 09/03/2020 08/06/2020 10/05/2017 11/27/2014  Depression screen PHQ 2/9  Decreased Interest 0 0 0 1 0  Down, Depressed, Hopeless 0 0 0 0 0  PHQ - 2 Score 0 0 0 1 0  Altered sleeping 0 _0 -  Tired, decreased energy  _0 -  Change in appetite 1 0 1 - -  Feeling bad or failure about yourself  0 0 0 1 -  Trouble concentrating 0 0 0 0 -  Moving slowly or fidgety/restless 0 0 1 0 -  Suicidal thoughts 0 0 0 0 -  PHQ-9 Score _1 -  Difficult doing work/chores Not difficult at all Not difficult at all Not difficult at all Somewhat difficult -   Interpretation of Total Score  Total Score Depression Severity:  1-4 = Minimal depression, 5-9 = Mild depression, 10-14 = Moderate depression, 15-19 =  Moderately severe depression, 20-27 = Severe depression   Psychosocial Evaluation and Intervention:  Psychosocial Evaluation - 11/01/21 1419       Psychosocial Evaluation & Interventions   Interventions Encouraged to exercise with the program and follow exercise prescription    Comments [name] is returning to the program after LVAD insertion. She has no barriers to attending. She has become deconditioned after a long period in the hospital and recovering. She has lost weight and wants to lose more weight. She is trying to increase her protein intake and wants to talk with Melissa,our RD.  Markita is ready to get started.    Expected Outcomes STG [name] ia able to attend all sechedules sessions. She is able to work with our RD and find ways to increase her protein intake without gaining weight if possible. LTG Zacari is able to continue to progress with er exercise and weight loss goals    Continue Psychosocial Services  Follow up required by staff             Psychosocial Re-Evaluation:  Psychosocial Re-Evaluation     [place] Name 12/18/21 1008 01/15/22 1019           Psychosocial Re-Evaluation   Current issues with Current Stress Concerns;Current Sleep Concerns Current Stress Concerns;Current Sleep Concerns      Comments [name] is doing well in rehab.  She has not been using her CPAP as she does not feel like she stops breathing anymore. She has a sleep study scheduled for 7/10 with Dr. [name].  She feels that before it was from fluid build, but now that she is dryer she feels better.  Her biggest stressor is dealing with LVAD.  She is just happy to be alive and stay out of hospital.  She does have several appointments to keep up with, but just focuses on the positives. [name] is doing well in rehab.  She continues to feel happy to alive each day and chooses to focus on the positive.  She had her sleep study done and they did not tell her to use CPAP but no one has called her about the results.   They want her to redo the test because of her pulse oximeter readings and restless leg syndrome causing her to jump.  She did not have apenic events, so shes happy about not having to use her CPAP.  She is doing well with her VAD and keeping close eye on wound to heal.      Expected Outcomes Short: Get sleep study Long: Continue to focus on the positive Short: Continue to follow up on sleep study Long: Continue to focus on positive      Interventions Encouraged to attend Cardiac Rehabilitation for the exercise;Stress management education Encouraged to attend Cardiac Rehabilitation for the exercise;Stress management education      Continue Psychosocial Services  -- Follow up  required by staff               Psychosocial Discharge (Final Psychosocial Re-Evaluation):  Psychosocial Re-Evaluation - 01/15/22 1019       Psychosocial Re-Evaluation   Current issues with Current Stress Concerns;Current Sleep Concerns    Comments [name] is doing well in rehab.  She continues to feel happy to alive each day and chooses to focus on the positive.  She had her sleep study done and they did not tell her to use CPAP but no one has called her about the results.  They want her to redo the test because of her pulse oximeter readings and restless leg syndrome causing her to jump.  She did not have apenic events, so shes happy about not having to use her CPAP.  She is doing well with her VAD and keeping close eye on wound to heal.    Expected Outcomes Short: Continue to follow up on sleep study Long: Continue to focus on positive    Interventions Encouraged to attend Cardiac Rehabilitation for the exercise;Stress management education    Continue Psychosocial Services  Follow up required by staff             Vocational Rehabilitation: Provide vocational rehab assistance to qualifying candidates.   Vocational Rehab Evaluation & Intervention:   Education: Education Goals: Education classes will be provided  on a variety of topics geared toward better understanding of heart health and risk factor modification. Participant will state understanding/return demonstration of topics presented as noted by education test scores.  Learning Barriers/Preferences:   General Cardiac Education Topics:  AED/CPR: - Group verbal and written instruction with the use of models to demonstrate the basic use of the AED with the basic ABC's of resuscitation.   Anatomy and Cardiac Procedures: - Group verbal and visual presentation and models provide information about basic cardiac anatomy and function. Reviews the testing methods done to diagnose heart disease and the outcomes of the test results. Describes the treatment choices: Medical Management, Angioplasty, or Coronary Bypass Surgery for treating various heart conditions including Myocardial Infarction, Angina, Valve Disease, and Cardiac Arrhythmias.  Written material given at graduation. Flowsheet Row Cardiac Rehab from 11/13/2021 in [facility] Cardiac and Pulmonary Rehab  Education need identified 11/13/21       Medication Safety: - Group verbal and visual instruction to review commonly prescribed medications for heart and lung disease. Reviews the medication, class of the drug, and side effects. Includes the steps to properly store meds and maintain the prescription regimen.  Written material given at graduation. Flowsheet Row Cardiac Rehab from 10/17/2020 in [facility] Cardiac and Pulmonary Rehab  Date 09/05/20  Educator SB  Instruction Review Code 1- Verbalizes Understanding       Intimacy: - Group verbal instruction through game format to discuss how heart and lung disease can affect sexual intimacy. Written material given at graduation.. Flowsheet Row Cardiac Rehab from 10/17/2020 in [facility] Cardiac and Pulmonary Rehab  Date 08/08/20  Educator [facility]  Instruction Review Code 1- Verbalizes Understanding       Know Your Numbers and Heart Failure: - Group verbal and  visual instruction to discuss disease risk factors for cardiac and pulmonary disease and treatment options.  Reviews associated critical values for Overweight/Obesity, Hypertension, Cholesterol, and Diabetes.  Discusses basics of heart failure: signs/symptoms and treatments.  Introduces Heart Failure Zone chart for action plan for heart failure.  Written material given at graduation. Flowsheet Row Cardiac Rehab from 11/13/2021 in [facility] Cardiac  and Pulmonary Rehab  Education need identified 11/13/21       Infection Prevention: - Provides verbal and written material to individual with discussion of infection control including proper hand washing and proper equipment cleaning during exercise session. Flowsheet Row Cardiac Rehab from 11/13/2021 in [facility] Cardiac and Pulmonary Rehab  Education need identified 11/13/21  Date 11/13/21  Educator [place]  Instruction Review Code 1- Verbalizes Understanding       Falls Prevention: - Provides verbal and written material to individual with discussion of falls prevention and safety. Flowsheet Row Cardiac Rehab from 11/13/2021 in [facility] Cardiac and Pulmonary Rehab  Education need identified 11/13/21  Date 11/13/21  Educator [place]  Instruction Review Code 1- Verbalizes Understanding       Other: -Provides group and verbal instruction on various topics (see comments)   Knowledge Questionnaire Score:  Knowledge Questionnaire Score - 11/13/21 1229       Knowledge Questionnaire Score   Pre Score 23/26             Core Components/Risk Factors/Patient Goals at Admission:  Personal Goals and Risk Factors at Admission - 11/13/21 1258       Core Components/Risk Factors/Patient Goals on Admission    Weight Management Yes;Weight Loss;Obesity    Intervention Weight Management: Develop a combined nutrition and exercise program designed to reach desired caloric intake, while maintaining appropriate intake of nutrient and fiber, sodium and fats, and  appropriate energy expenditure required for the weight goal.;Weight Management/Obesity: Establish reasonable short term and long term weight goals.    Admit Weight 230 lb 12.8 oz (104.7 kg)   weighed with LVAD & bag   Goal Weight: Short Term 225 lb (102.1 kg)    Goal Weight: Long Term 200 lb (90.7 kg)    Expected Outcomes Short Term: Continue to assess and modify interventions until short term weight is achieved;Long Term: Adherence to nutrition and physical activity/exercise program aimed toward attainment of established weight goal;Weight Loss: Understanding of general recommendations for a balanced deficit meal plan, which promotes 1-2 lb weight loss per week and includes a negative energy balance of [phone] kcal/d;Understanding recommendations for meals to include 15-35% energy as protein, 25-35% energy from fat, 35-60% energy from carbohydrates, less than 259m of dietary cholesterol, 20-35 gm of total fiber daily;Understanding of distribution of calorie intake throughout the day with the consumption of 4-5 meals/snacks    Heart Failure Yes    Intervention Provide a combined exercise and nutrition program that is supplemented with education, support and counseling about heart failure. Directed toward relieving symptoms such as shortness of breath, decreased exercise tolerance, and extremity edema.    Expected Outcomes Improve functional capacity of life;Short term: Attendance in program 2-3 days a week with increased exercise capacity. Reported lower sodium intake. Reported increased fruit and vegetable intake. Reports medication compliance.;Short term: Daily weights obtained and reported for increase. Utilizing diuretic protocols set by physician.;Long term: Adoption of self-care skills and reduction of barriers for early signs and symptoms recognition and intervention leading to self-care maintenance.    Hypertension Yes    Intervention Provide education on lifestyle modifcations including regular  physical activity/exercise, weight management, moderate sodium restriction and increased consumption of fresh fruit, vegetables, and low fat dairy, alcohol moderation, and smoking cessation.;Monitor prescription use compliance.    Expected Outcomes Short Term: Continued assessment and intervention until BP is < 140/949mHG in hypertensive participants. < 130/8034mG in hypertensive participants with diabetes, heart failure or chronic kidney disease.;Long Term: Maintenance  of blood pressure at goal levels.    Lipids Yes    Intervention Provide education and support for participant on nutrition & aerobic/resistive exercise along with prescribed medications to achieve LDL <6m, HDL >470m    Expected Outcomes Short Term: Participant states understanding of desired cholesterol values and is compliant with medications prescribed. Participant is following exercise prescription and nutrition guidelines.;Long Term: Cholesterol controlled with medications as prescribed, with individualized exercise RX and with personalized nutrition plan. Value goals: LDL < 7036mHDL > 40 mg.             Education:Diabetes - Individual verbal and written instruction to review signs/symptoms of diabetes, desired ranges of glucose level fasting, after meals and with exercise. Acknowledge that pre and post exercise glucose checks will be done for 3 sessions at entry of program.   Core Components/Risk Factors/Patient Goals Review:   Goals and Risk Factor Review     Row Name 12/18/21 1012 01/15/22 1027           Core Components/Risk Factors/Patient Goals Review   Personal Goals Review Heart Failure;Lipids;Weight Management/Obesity;Hypertension Heart Failure;Lipids;Weight Management/Obesity;Hypertension      Review [name] doing well in rehab.  Her weight is starting to go up some now that she is eating again, but she is working closely with dietitan to keep close eye on it.  Now that she can eat, she will start to  focus on heart healthy eating again. She is good about checking her LVAD and staying on top of her fluid levels.  She has not had any heart failure symptoms.  Her pressures are staying steady both in class and at home. [name] doing well in rehab.  Her weight is starting to come down some.  Her only heart failure symptom is getting SOB when it is hot outside, so she tries to stay inside on those days.  Her pressures are staying steady and still detectable with automatic cuff.  She is doing well on her medications.      Expected Outcomes -- short: Continue to work on weight loss Long: Continue to manage HF/VAD               Core Components/Risk Factors/Patient Goals at Discharge (Final Review):   Goals and Risk Factor Review - 01/15/22 1027       Core Components/Risk Factors/Patient Goals Review   Personal Goals Review Heart Failure;Lipids;Weight Management/Obesity;Hypertension    Review [name] doing well in rehab.  Her weight is starting to come down some.  Her only heart failure symptom is getting SOB when it is hot outside, so she tries to stay inside on those days.  Her pressures are staying steady and still detectable with automatic cuff.  She is doing well on her medications.    Expected Outcomes short: Continue to work on weight loss Long: Continue to manage HF/VAD             ITP Comments:  ITP Comments     Row Name 11/01/21 1424 11/13/21 1223 11/20/21 1002 12/04/21 0731 12/09/21 1015   ITP Comments Virtual orientation call completed today. shehas an appointment on Date: 11/13/2021  for EP eval and gym Orientation.  [facility] . Completed 6MWT and gym orientation. Initial ITP created and sent for review to Dr. MarEmily Filbertedical Director. First full day of exercise!  Patient was oriented to gym and equipment including functions, settings, policies, and procedures.  Patient's individual  exercise prescription and treatment  plan were reviewed.  All starting workloads were established based on the results of the 6 minute walk test done at initial orientation visit.  The plan for exercise progression was also introduced and progression will be customized based on patient's performance and goals. 30 Day review completed. Medical Director ITP review done, changes made as directed, and signed approval by Medical Director. Completed initial RD consultation    Row Name 12/30/21 [phone] 01/01/22 0743 01/29/22 0641       ITP Comments Deltha missed part of last week due to multiple iron infusions in the ED and med adjustments.  She was also feeling washed out on Friday from all her appointments. 30 Day review completed. Medical Director ITP review done, changes made as directed, and signed approval by Medical Director. 30 Day review completed. Medical Director ITP review done, changes made as directed, and signed approval by Medical Director.              Comments:

## 2022-01-30 ENCOUNTER — Encounter: Payer: Self-pay | Admitting: Gynecologic Oncology

## 2022-01-30 ENCOUNTER — Other Ambulatory Visit: Payer: Self-pay

## 2022-01-30 ENCOUNTER — Ambulatory Visit (HOSPITAL_BASED_OUTPATIENT_CLINIC_OR_DEPARTMENT_OTHER): Payer: BC Managed Care – PPO | Admitting: Gynecologic Oncology

## 2022-01-30 ENCOUNTER — Inpatient Hospital Stay: Payer: BC Managed Care – PPO | Attending: Gynecologic Oncology | Admitting: Gynecologic Oncology

## 2022-01-30 ENCOUNTER — Ambulatory Visit (HOSPITAL_COMMUNITY): Payer: Self-pay | Admitting: Pharmacist

## 2022-01-30 VITALS — BP 107/60 | HR 87 | Temp 98.3°F | Resp 16 | Ht 63.0 in | Wt 235.2 lb

## 2022-01-30 DIAGNOSIS — R1900 Intra-abdominal and pelvic swelling, mass and lump, unspecified site: Secondary | ICD-10-CM | POA: Insufficient documentation

## 2022-01-30 DIAGNOSIS — N939 Abnormal uterine and vaginal bleeding, unspecified: Secondary | ICD-10-CM | POA: Insufficient documentation

## 2022-01-30 DIAGNOSIS — N9489 Other specified conditions associated with female genital organs and menstrual cycle: Secondary | ICD-10-CM

## 2022-01-30 DIAGNOSIS — Z95811 Presence of heart assist device: Secondary | ICD-10-CM | POA: Diagnosis not present

## 2022-01-30 DIAGNOSIS — Z6841 Body Mass Index (BMI) 40.0 and over, adult: Secondary | ICD-10-CM

## 2022-01-30 DIAGNOSIS — Z1231 Encounter for screening mammogram for malignant neoplasm of breast: Secondary | ICD-10-CM | POA: Diagnosis not present

## 2022-01-30 LAB — POCT INR: INR: 3 (ref 2.0–3.0)

## 2022-01-30 MED ORDER — SENNOSIDES-DOCUSATE SODIUM 8.6-50 MG PO TABS: 2.0000 | ORAL_TABLET | Freq: Every day | ORAL | 0 refills | Status: DC

## 2022-01-30 MED ORDER — TRAMADOL HCL 50 MG PO TABS: 50.0000 mg | ORAL_TABLET | Freq: Four times a day (QID) | ORAL | 0 refills | Status: DC | PRN

## 2022-01-30 NOTE — Progress Notes (Signed)
Gynecologic Oncology Return Clinic Visit  01/30/2022  Reason for Visit: Planning prior to surgery  Treatment History: The patient was recently admitted from 3/6 to 3/31 with low output heart failure in the setting of hyperthyroidism.  Ultimately underwent placement of an LVAD on 3/14.  She was discharged to acute inpatient rehab at the end of March and discharged from rehab on 4/6. During her hospital stay, CT scan was performed with incidental finding of a pelvic mass.  Further work-up and treatment was deferred in the setting of her acute cardiac issues.   CT of the abdomen and pelvis on 08/26/2021 with incidental finding of a 11.7 x 4.3 x 7.4 cm low-attenuation mass within the left adnexa.  Uterus and right ovary unremarkable in appearance.  No significant volume of ascites, no adenopathy.  The findings included cardiomegaly, mild interstitial pulmonary edema in the lung bases, biliary sludge, and a 5 mm nonobstructive calculus in the lower pole of the collecting system of the right kidney. Pelvic ultrasound exam on 08/27/2021 shows a 12 x 9.4 x 4.8 cm complex multiseptated cystic mass replacing the left adnexa.  Right ovary normal in appearance measuring up to 2.9 cm. Tumor markers obtained on 08/26/2021 were notable for a normal CEA of 1.1 and CA-125 of 17.3.  Pelvic ultrasound on 11/25/2021: 11.9 cm complex cystic mass of left adnexa, only minimally increased in volume since prior study.  Contains multiple septations and some peripheral vascularity as well as a potential mural nodule measuring up to 24 mm.  Endometrial biopsy performed on 7/6 showed secretory endometrium, no hyperplasia or malignancy.  Pelvic ultrasound exam on 01/28/2022: Uterus measures 12.6 x 6.8 x 7.4 cm.  Endometrium is 6.6 mm.  Right ovary is normal in appearance.  Left ovary contains a mass measuring 11.6 x 6.7 x 9.1 cm.  Numerous internal septations are present within the mass, some are slightly thicker and contain internal blood  flow.  No discrete solid components.  Stable appearance of this mass from prior ultrasound.  Interval History: Doing well, comes in with her husband today. Continues to take the progesterone, spotting a little daily.  Denies any associated pain or cramping. Denies any new pelvic symptoms. Reports baseline bowel and bladder function.  Past Medical/Surgical History: Past Medical History:  Diagnosis Date   Automatic implantable cardioverter-defibrillator in situ    Chronic CHF (congestive heart failure) ([place])    a. EF 15-20% b. RHC (09/2013) RA 14, RV 57/22, PA 64/36 (48), PCWP 18, FIck CO/CI 3.7/1.6, PVR 8.1 WU, PA sat 47%    History of hypothyroidism    History of stomach ulcers    Hyperthyroidism    Hypotension    Morbid obesity ([place])    Myocardial infarction ([place]) 08/2013   Nocturnal dyspnea    Nonischemic cardiomyopathy ([place])    Sinus tachycardia    Sleep apnea    Snoring-prob OSA 09/04/2011   S[employer] Fidel[employer] ICD lead RECALL  6949    Stroke ([place]) 2021   some residual right-sided weakness   UARS (upper airway resistance syndrome) 09/04/2011   HST 12/2013:  AHI 4/hr (numerous episodes of airflow reduction that did not have concomitant desaturation)     Past Surgical History:  Procedure Laterality Date   BREATH TEK H PYLORI N/A 11/09/2014   Procedure: BREATH TEK Kandis Ban;  Surgeon: Greer Pickerel, MD;  Location: [facility];  Service: General;  Laterality: N/A;   CARDIAC CATHETERIZATION  ~ 2006; 09/2013   CARDIAC CATHETERIZATION N/A 05/23/2016  Procedure: Right Heart Cath;  Surgeon: Larey Dresser, MD;  Location: [place] [facility];  Service: Cardiovascular;  Laterality: N/A;   CARDIAC DEFIBRILLATOR PLACEMENT  2006; 12/26/2013   Medtronic [id number]; 12-2013 ICD gen change and RV lead revision with new 6935 RV lead by Dr [name]   CENTRAL LINE INSERTION  08/28/2021   Procedure: CENTRAL LINE INSERTION;  Surgeon: Larey Dresser, MD;  Location: [place] [facility];  Service:  Cardiovascular;;   CESAREAN SECTION  1999   IMPLANTABLE CARDIOVERTER DEFIBRILLATOR GENERATOR CHANGE N/A 12/26/2013   Procedure: IMPLANTABLE CARDIOVERTER DEFIBRILLATOR GENERATOR CHANGE;  Surgeon: Deboraha Sprang, MD;  Location: [facility] C[facility] LAB;  Service: Cardiovascular;  Laterality: N/A;   INSERTION OF IMPLANTABLE LEFT VENTRICULAR ASSIST DEVICE N/A 09/03/2021   Procedure: INSERTION OF IMPLANTABLE LEFT VENTRICULAR ASSIST DEVICE/ Heartmate 3;  Surgeon: Dahlia Byes, MD;  Location: [place];  Service: Open Heart Surgery;  Laterality: N/A;   IR CT HEAD LTD  04/17/2020   IR CT HEAD LTD  04/17/2020   IR INTRA CRAN STENT  04/17/2020   IR PERCUTANEOUS ART THROMBECTOMY/INFUSION INTRACRANIAL INC DIAG ANGIO  04/17/2020   IR RADIOLOGIST EVAL & MGMT  06/05/2020   LEAD REVISION N/A 12/26/2013   Procedure: LEAD REVISION;  Surgeon: Deboraha Sprang, MD;  Location: [facility] CATH LAB;  Service: Cardiovascular;  Laterality: N/A;   PLACEMENT OF IMPELLA LEFT VENTRICULAR ASSIST DEVICE Right 08/29/2021   Procedure: PLACEMENT OF IMPELLA 5.5 LEFT VENTRICULAR ASSIST DEVICE;  Surgeon: Gaye Pollack, MD;  Location: [place];  Service: Open Heart Surgery;  Laterality: Right;   RADIOLOGY WITH ANESTHESIA N/A 04/17/2020   Procedure: Code Stroke;  Surgeon: Luanne Bras, MD;  Location: [place];  Service: Radiology;  Laterality: N/A;   REMOVAL OF IMPELLA LEFT VENTRICULAR ASSIST DEVICE N/A 09/03/2021   Procedure: REMOVAL OF IMPELLA LEFT VENTRICULAR ASSIST DEVICE;  Surgeon: Dahlia Byes, MD;  Location: [place];  Service: Open Heart Surgery;  Laterality: N/A;   RIGHT HEART CATH N/A 08/28/2021   Procedure: RIGHT HEART CATH;  Surgeon: Larey Dresser, MD;  Location: [place] [facility];  Service: Cardiovascular;  Laterality: N/A;   RIGHT HEART CATHETERIZATION N/A 02/15/2014   Procedure: RIGHT HEART CATH;  Surgeon: Larey Dresser, MD;  Location: [facility] CATH LAB;  Service: Cardiovascular;  Laterality: N/A;   TEE WITHOUT CARDIOVERSION N/A 08/29/2021    Procedure: TRANSESOPHAGEAL ECHOCARDIOGRAM (TEE);  Surgeon: Gaye Pollack, MD;  Location: [place];  Service: Open Heart Surgery;  Laterality: N/A;   TEE WITHOUT CARDIOVERSION N/A 09/03/2021   Procedure: TRANSESOPHAGEAL ECHOCARDIOGRAM (TEE);  Surgeon: Dahlia Byes, MD;  Location: [place];  Service: Open Heart Surgery;  Laterality: N/A;   TUBAL LIGATION  1999    Family History  Problem Relation Age of Onset   Heart disease Mother    Obesity Mother    High blood pressure Father    Stroke Father    Heart disease Maternal Grandmother    Colon cancer Neg Hx    Breast cancer Neg Hx    Ovarian cancer Neg Hx    Endometrial cancer Neg Hx    Pancreatic cancer Neg Hx    Prostate cancer Neg Hx     Social History   Socioeconomic History   Marital status: Married    Spouse name: Marillyn Goren   Number of children: 2   Years of education: Not on file   Highest education level: Not on file  Occupational History   Occupation: home maker  Employer: [employer]  Tobacco Use   Smoking status: Never   Smokeless tobacco: Never  Vaping Use   Vaping Use: Never used  Substance and Sexual Activity   Alcohol use: No   Drug use: No   Sexual activity: Not Currently  Other Topics Concern   Not on file  Social History Narrative   [place] sometimes   Right handed   Social Determinants of Health   Financial Resource Strain: Low Risk  (08/28/2021)   Overall Financial Resource Strain (CARDIA)    Difficulty of Paying Living Expenses: Not very hard  Food Insecurity: No Food Insecurity (08/28/2021)   Hunger Vital Sign    Worried About Running Out of Food in the Last Year: Never true    Ran Out of Food in the Last Year: Never true  Transportation Needs: No Transportation Needs (08/28/2021)   PRAPARE - Hydrologist (Medical): No    Lack of Transportation (Non-Medical): No  Physical Activity: Not on file  Stress: Not on file  Social Connections: Not on file    Current  Medications:  Current Outpatient Medications:    aspirin EC 81 MG tablet, Take 1 tablet (81 mg total) by mouth daily. Swallow whole., Disp: 90 tablet, Rfl: 3   medroxyPROGESTERone (PROVERA) 10 MG tablet, Take 1-2 tablets (10-20 mg total) by mouth See admin instructions. Take 10 mg daily, may take a second 10 mg dose as needed for heavy bleeding, Disp: 20 tablet, Rfl: 0   methimazole (TAPAZOLE) 10 MG tablet, Take 1 tablet (10 mg total) by mouth 2 (two) times daily., Disp: 180 tablet, Rfl: 1   potassium chloride SA (KLOR-CON M) 20 MEQ tablet, Take 3 tablets (60 mEq total) by mouth 2 (two) times daily. (Patient taking differently: Take 60 mEq by mouth daily.), Disp: 180 tablet, Rfl: 11   predniSONE (DELTASONE) 10 MG tablet, Take 1 tablet (10 mg total) by mouth daily with breakfast., Disp: 90 tablet, Rfl: 1   quiniDINE gluconate 324 MG CR tablet, Take 1 tablet (324 mg total) by mouth 2 (two) times daily., Disp: 60 tablet, Rfl: 0   rosuvastatin (CRESTOR) 40 MG tablet, Take 1 tablet (40 mg total) by mouth at bedtime., Disp: 30 tablet, Rfl: 0   sildenafil (REVATIO) 20 MG tablet, Take 1 tablet (20 mg total) by mouth 3 (three) times daily., Disp: 90 tablet, Rfl: 6   spironolactone (ALDACTONE) 25 MG tablet, Take 1 tablet (25 mg total) by mouth daily., Disp: 45 tablet, Rfl: 3   torsemide (DEMADEX) 20 MG tablet, Take 1 tablet (20 mg total) by mouth every other day., Disp: 45 tablet, Rfl: 3   warfarin (COUMADIN) 2 MG tablet, Take 2 mg (1 tabs) every Tuesday/Thursday/Saturday and 4 mg (2 tabs) all other days or as directed by the [facility] (Patient taking differently: Take 2-4 mg by mouth See admin instructions. Take 4 mg on Mon, Wed, Fri, and Sun Take 2 mg on Tues, Thurs, and Sat), Disp: 180 tablet, Rfl: 5   zinc gluconate 50 MG tablet, Take 1 tablet (50 mg total) by mouth daily., Disp: 30 tablet, Rfl: 6   senna-docusate (SENOKOT-S) 8.6-50 MG tablet, Take 2 tablets by mouth at bedtime. For  AFTER surgery, do not take if having diarrhea, Disp: 30 tablet, Rfl: 0   traMADol (ULTRAM) 50 MG tablet, Take 1 tablet (50 mg total) by mouth every 6 (six) hours as needed for severe pain. For AFTER surgery only, do not take  and drive, Disp: 10 tablet, Rfl: 0  Review of Systems: Denies appetite changes, fevers, chills, fatigue, unexplained weight changes. Denies hearing loss, neck lumps or masses, mouth sores, ringing in ears or voice changes. Denies cough or wheezing.  Denies shortness of breath. Denies chest pain or palpitations. Denies leg swelling. Denies abdominal distention, pain, blood in stools, constipation, diarrhea, nausea, vomiting, or early satiety. Denies pain with intercourse, dysuria, frequency, hematuria or incontinence. Denies hot flashes, pelvic pain, vaginal bleeding or vaginal discharge.   Denies joint pain, back pain or muscle pain/cramps. Denies itching, rash, or wounds. Denies dizziness, headaches, numbness or seizures. Denies swollen lymph nodes or glands, denies easy bruising or bleeding. Denies anxiety, depression, confusion, or decreased concentration.  Physical Exam: BP 107/60 (BP Location: Right Arm, Patient Position: Sitting)   Pulse 87   Temp 98.3 F (36.8 C) (Oral)   Resp 16   Ht '5\' 3"'$  (1.6 m)   Wt 235 lb 3.2 oz (106.7 kg)   SpO2 100%   BMI 41.66 kg/m  General: Alert, oriented, no acute distress.  HEENT: Normocephalic, atraumatic. Sclera anicteric.  Chest: Clear to auscultation bilaterally. No wheezes, rhonchi, or rales. Cardiovascular: Mechanical hum present from LVAD. Abdomen: Obese. Normoactive bowel sounds. Soft, nondistended, nontender to palpation. No masses or hepatosplenomegaly appreciated. No palpable fluid wave.  Extremities: Grossly normal range of motion. Warm, well perfused. Trace edema bilaterally.   Laboratory & Radiologic Studies: CBC    Component Value Date/Time   WBC 9.5 12/31/2021 0933   RBC 3.34 (L) 12/31/2021 0933   HGB  9.1 (L) 12/31/2021 0933   HGB 10.1 (L) 12/25/2020 1132   HCT 30.2 (L) 12/31/2021 0933   HCT 33.8 (L) 12/25/2020 1132   PLT 319 12/31/2021 0933   PLT 430 12/25/2020 1132   MCV 90.4 12/31/2021 0933   MCV 73 (L) 12/25/2020 1132   MCV 85 09/23/2013 0443   MCH 27.2 12/31/2021 0933   MCHC 30.1 12/31/2021 0933   RDW 21.4 (H) 12/31/2021 0933   RDW 17.0 (H) 12/25/2020 1132   RDW 15.4 (H) 09/23/2013 0443   LYMPHSABS 1.2 12/24/2021 1630   LYMPHSABS 1.3 12/25/2020 1132   LYMPHSABS 2.0 09/23/2013 0443   MONOABS 0.7 12/24/2021 1630   MONOABS 0.4 09/23/2013 0443   EOSABS 0.0 12/24/2021 1630   EOSABS 0.0 12/25/2020 1132   EOSABS 0.1 09/23/2013 0443   BASOSABS 0.0 12/24/2021 1630   BASOSABS 0.0 12/25/2020 1132   BASOSABS 0.1 09/23/2013 0443      Latest Ref Rng & Units 01/07/2022   10:44 AM 12/31/2021    9:33 AM 12/24/2021    4:50 PM  BMP  Glucose 70 - 99 mg/dL 102  97  86   BUN 6 - 20 mg/dL '15  12  7   '$ Creatinine 0.44 - 1.00 mg/dL 1.28  1.41  1.10   Sodium 135 - 145 mmol/L 138  138  138   Potassium 3.5 - 5.1 mmol/L 4.2  4.4  3.4   Chloride 98 - 111 mmol/L 111  107  104   CO2 22 - 32 mmol/L 20  22    Calcium 8.9 - 10.3 mg/dL 9.2  9.3     Assessment & Plan: [name] is a 51 y.o. woman with complex cardiac history with complex adnexal mass, relatively stable in size over time as well as abnormal uterine bleeding.  Plan for definitive surgery with total hysterectomy and BSO.  Discussed with the patient plan for surgery next week.  She is getting admitted on Monday in anticipation of anticoagulation bridge in the setting of her LVAD.  Surgery is scheduled on Wednesday.  We have previously discussed options for surgery.  The patient would like to undergo bilateral salpingo-oophorectomy to avoid need for future adnexal surgery.  Enlarged adnexa will be placed in Endo Catch bag for controlled cyst drainage and removal.  Plan will be to send adnexal mass for frozen section.  If borderline tumor,  then we will proceed with BSO, total hysterectomy, and staging including omental biopsy and peritoneal biopsies.  If malignancy identified, decision will be made at the time of surgery about the safety and feasibility of lymph node biopsy.  We discussed the decision about total hysterectomy.  While removal of bilateral ovaries would place the patient into surgical menopause, she is interested in removing the uterus to avoid any future bleeding and/or need for surgery.  Discussed that this means increased risk related to bleeding and postoperative bleeding at the time of surgery.  Additionally, if the patient becomes symptomatic from a menopausal standpoint and it is acceptable to start hormone replacement therapy per cardiology, removal of her uterus would obviate the need for protected progesterone.  I think it is reasonable to move forward with plan for total hysterectomy.  Given the size of her uterus, we discussed that there is a risk that it will not fit out through the vagina.  Reviewed option of placing the uterus in a bag and performing contained morcellation versus extraction through a mini laparotomy on her abdomen.  The risks of surgery were discussed in detail and she understands these to include infection; wound separation; hernia; vaginal cuff separation, injury to adjacent organs such as bowel, bladder, blood vessels, ureters and nerves; bleeding which may require blood transfusion; anesthesia risk; thromboembolic events; possible death; unforeseen complications; possible need for re-exploration; medical complications such as heart attack, stroke, pleural effusion and pneumonia; and, if full lymphadenectomy is performed the risk of lymphedema and lymphocyst. The patient will receive DVT and antibiotic prophylaxis as indicated. She voiced a clear understanding. She had the opportunity to ask questions. Perioperative instructions were reviewed with her. Prescriptions for post-op medications were  sent to her pharmacy of choice.  32 minutes of total time was spent for this patient encounter, including preparation, face-to-face counseling with the patient and coordination of care, and documentation of the encounter.  Jeral Pinch, MD  Division of Gynecologic Oncology  Department of Obstetrics and Gynecology  [facility]

## 2022-01-30 NOTE — Progress Notes (Signed)
Patient here with her husband for follow up with Dr. Jeral Pinch and for a pre-operative appointment prior to her scheduled surgery on February 05, 2022. She is scheduled for robotic assisted total laparoscopic hysterectomy, bilateral salpingo-oophorectomy, possible staging if a cancer is identified, possible laparotomy. The surgery was discussed in detail.  See after visit summary for additional details. Visual aids used to discuss items related to surgery including the incentive spirometer, sequential compression stockings, foley catheter, IV pump, multi-modal pain regimen including tylenol, photo of the surgical robot, female reproductive system to discuss surgery in detail.      Discussed post-op pain management in detail including the aspects of the enhanced recovery pathway.  Advised her that a new prescription would be sent in for tramadol and it is only to be used for after her upcoming surgery.  We discussed the use of tylenol post-op and to monitor for a maximum of 4,000 mg in a 24 hour period.  Also prescribed sennakot to be used after surgery and to hold if having loose stools.  Discussed bowel regimen in detail.     Discussed the use of SCDs and measures to take at home to prevent DVT including frequent mobility.  Reportable signs and symptoms of DVT discussed. Post-operative instructions discussed and expectations for after surgery. Incisional care discussed as well including reportable signs and symptoms including erythema, drainage, wound separation.     10 minutes spent with the patient.  Verbalizing understanding of material discussed. No needs or concerns voiced at the end of the visit.   Advised patient and family to call for any needs.  Advised that her post-operative medications had been prescribed and could be picked up at any time.  The plan will be for admission pre-operatively under cardiology service with anticoagulation management per cards as well. Post-op appointments given  to patient. Hysterectomy form obtained.     This appointment is included in the global surgical bundle as pre-operative teaching and has no charge.

## 2022-01-30 NOTE — H&P (View-Only) (Signed)
Gynecologic Oncology Return Clinic Visit  01/30/2022  Reason for Visit: Planning prior to surgery  Treatment History: The patient was recently admitted from 3/6 to 3/31 with low output heart failure in the setting of hyperthyroidism.  Ultimately underwent placement of an LVAD on 3/14.  She was discharged to acute inpatient rehab at the end of March and discharged from rehab on 4/6. During her hospital stay, CT scan was performed with incidental finding of a pelvic mass.  Further work-up and treatment was deferred in the setting of her acute cardiac issues.   CT of the abdomen and pelvis on 08/26/2021 with incidental finding of a 11.7 x 4.3 x 7.4 cm low-attenuation mass within the left adnexa.  Uterus and right ovary unremarkable in appearance.  No significant volume of ascites, no adenopathy.  The findings included cardiomegaly, mild interstitial pulmonary edema in the lung bases, biliary sludge, and a 5 mm nonobstructive calculus in the lower pole of the collecting system of the right kidney. Pelvic ultrasound exam on 08/27/2021 shows a 12 x 9.4 x 4.8 cm complex multiseptated cystic mass replacing the left adnexa.  Right ovary normal in appearance measuring up to 2.9 cm. Tumor markers obtained on 08/26/2021 were notable for a normal CEA of 1.1 and CA-125 of 17.3.  Pelvic ultrasound on 11/25/2021: 11.9 cm complex cystic mass of left adnexa, only minimally increased in volume since prior study.  Contains multiple septations and some peripheral vascularity as well as a potential mural nodule measuring up to 24 mm.  Endometrial biopsy performed on 7/6 showed secretory endometrium, no hyperplasia or malignancy.  Pelvic ultrasound exam on 01/28/2022: Uterus measures 12.6 x 6.8 x 7.4 cm.  Endometrium is 6.6 mm.  Right ovary is normal in appearance.  Left ovary contains a mass measuring 11.6 x 6.7 x 9.1 cm.  Numerous internal septations are present within the mass, some are slightly thicker and contain internal blood  flow.  No discrete solid components.  Stable appearance of this mass from prior ultrasound.  Interval History: Doing well, comes in with her husband today. Continues to take the progesterone, spotting a little daily.  Denies any associated pain or cramping. Denies any new pelvic symptoms. Reports baseline bowel and bladder function.  Past Medical/Surgical History: Past Medical History:  Diagnosis Date   Automatic implantable cardioverter-defibrillator in situ    Chronic CHF (congestive heart failure) ([place])    a. EF 15-20% b. RHC (09/2013) RA 14, RV 57/22, PA 64/36 (48), PCWP 18, FIck CO/CI 3.7/1.6, PVR 8.1 WU, PA sat 47%    History of hypothyroidism    History of stomach ulcers    Hyperthyroidism    Hypotension    Morbid obesity ([place])    Myocardial infarction ([place]) 08/2013   Nocturnal dyspnea    Nonischemic cardiomyopathy ([place])    Sinus tachycardia    Sleep apnea    Snoring-prob OSA 09/04/2011   [employer] Fidel[employer] ICD lead RECALL  6949    Stroke ([place]) 2021   some residual right-sided weakness   UARS (upper airway resistance syndrome) 09/04/2011   HST 12/2013:  AHI 4/hr (numerous episodes of airflow reduction that did not have concomitant desaturation)     Past Surgical History:  Procedure Laterality Date   BREATH TEK H PYLORI N/A 11/09/2014   Procedure: BREATH TEK Kandis Ban;  Surgeon: Greer Pickerel, MD;  Location: [facility];  Service: General;  Laterality: N/A;   CARDIAC CATHETERIZATION  ~ 2006; 09/2013   CARDIAC CATHETERIZATION N/A 05/23/2016  Procedure: Right Heart Cath;  Surgeon: Larey Dresser, MD;  Location: [place] [facility];  Service: Cardiovascular;  Laterality: N/A;   CARDIAC DEFIBRILLATOR PLACEMENT  2006; 12/26/2013   Medtronic [id number]; 12-2013 ICD gen change and RV lead revision with new 6935 RV lead by Dr [name]   CENTRAL LINE INSERTION  08/28/2021   Procedure: CENTRAL LINE INSERTION;  Surgeon: Larey Dresser, MD;  Location: [place] [facility];  Service:  Cardiovascular;;   CESAREAN SECTION  1999   IMPLANTABLE CARDIOVERTER DEFIBRILLATOR GENERATOR CHANGE N/A 12/26/2013   Procedure: IMPLANTABLE CARDIOVERTER DEFIBRILLATOR GENERATOR CHANGE;  Surgeon: Deboraha Sprang, MD;  Location: [facility];  Service: Cardiovascular;  Laterality: N/A;   INSERTION OF IMPLANTABLE LEFT VENTRICULAR ASSIST DEVICE N/A 09/03/2021   Procedure: INSERTION OF IMPLANTABLE LEFT VENTRICULAR ASSIST DEVICE/ Heartmate 3;  Surgeon: Dahlia Byes, MD;  Location: [place];  Service: Open Heart Surgery;  Laterality: N/A;   IR CT HEAD LTD  04/17/2020   IR CT HEAD LTD  04/17/2020   IR INTRA CRAN STENT  04/17/2020   IR PERCUTANEOUS ART THROMBECTOMY/INFUSION INTRACRANIAL INC DIAG ANGIO  04/17/2020   IR RADIOLOGIST EVAL & MGMT  06/05/2020   LEAD REVISION N/A 12/26/2013   Procedure: LEAD REVISION;  Surgeon: Deboraha Sprang, MD;  Location: [facility] CATH LAB;  Service: Cardiovascular;  Laterality: N/A;   PLACEMENT OF IMPELLA LEFT VENTRICULAR ASSIST DEVICE Right 08/29/2021   Procedure: PLACEMENT OF IMPELLA 5.5 LEFT VENTRICULAR ASSIST DEVICE;  Surgeon: Gaye Pollack, MD;  Location: [place];  Service: Open Heart Surgery;  Laterality: Right;   RADIOLOGY WITH ANESTHESIA N/A 04/17/2020   Procedure: Code Stroke;  Surgeon: Luanne Bras, MD;  Location: [place];  Service: Radiology;  Laterality: N/A;   REMOVAL OF IMPELLA LEFT VENTRICULAR ASSIST DEVICE N/A 09/03/2021   Procedure: REMOVAL OF IMPELLA LEFT VENTRICULAR ASSIST DEVICE;  Surgeon: Dahlia Byes, MD;  Location: [place];  Service: Open Heart Surgery;  Laterality: N/A;   RIGHT HEART CATH N/A 08/28/2021   Procedure: RIGHT HEART CATH;  Surgeon: Larey Dresser, MD;  Location: [place] [facility];  Service: Cardiovascular;  Laterality: N/A;   RIGHT HEART CATHETERIZATION N/A 02/15/2014   Procedure: RIGHT HEART CATH;  Surgeon: Larey Dresser, MD;  Location: [facility] CATH LAB;  Service: Cardiovascular;  Laterality: N/A;   TEE WITHOUT CARDIOVERSION N/A 08/29/2021    Procedure: TRANSESOPHAGEAL ECHOCARDIOGRAM (TEE);  Surgeon: Gaye Pollack, MD;  Location: [place];  Service: Open Heart Surgery;  Laterality: N/A;   TEE WITHOUT CARDIOVERSION N/A 09/03/2021   Procedure: TRANSESOPHAGEAL ECHOCARDIOGRAM (TEE);  Surgeon: Dahlia Byes, MD;  Location: [place];  Service: Open Heart Surgery;  Laterality: N/A;   TUBAL LIGATION  1999    Family History  Problem Relation Age of Onset   Heart disease Mother    Obesity Mother    High blood pressure Father    Stroke Father    Heart disease Maternal Grandmother    Colon cancer Neg Hx    Breast cancer Neg Hx    Ovarian cancer Neg Hx    Endometrial cancer Neg Hx    Pancreatic cancer Neg Hx    Prostate cancer Neg Hx     Social History   Socioeconomic History   Marital status: Married    Spouse name: Nikiah Goin   Number of children: 2   Years of education: Not on file   Highest education level: Not on file  Occupational History   Occupation: home maker  Employer: [employer]  Tobacco Use   Smoking status: Never   Smokeless tobacco: Never  Vaping Use   Vaping Use: Never used  Substance and Sexual Activity   Alcohol use: No   Drug use: No   Sexual activity: Not Currently  Other Topics Concern   Not on file  Social History Narrative   [place] sometimes   Right handed   Social Determinants of Health   Financial Resource Strain: Low Risk  (08/28/2021)   Overall Financial Resource Strain (CARDIA)    Difficulty of Paying Living Expenses: Not very hard  Food Insecurity: No Food Insecurity (08/28/2021)   Hunger Vital Sign    Worried About Running Out of Food in the Last Year: Never true    Ran Out of Food in the Last Year: Never true  Transportation Needs: No Transportation Needs (08/28/2021)   PRAPARE - Hydrologist (Medical): No    Lack of Transportation (Non-Medical): No  Physical Activity: Not on file  Stress: Not on file  Social Connections: Not on file    Current  Medications:  Current Outpatient Medications:    aspirin EC 81 MG tablet, Take 1 tablet (81 mg total) by mouth daily. Swallow whole., Disp: 90 tablet, Rfl: 3   medroxyPROGESTERone (PROVERA) 10 MG tablet, Take 1-2 tablets (10-20 mg total) by mouth See admin instructions. Take 10 mg daily, may take a second 10 mg dose as needed for heavy bleeding, Disp: 20 tablet, Rfl: 0   methimazole (TAPAZOLE) 10 MG tablet, Take 1 tablet (10 mg total) by mouth 2 (two) times daily., Disp: 180 tablet, Rfl: 1   potassium chloride SA (KLOR-CON M) 20 MEQ tablet, Take 3 tablets (60 mEq total) by mouth 2 (two) times daily. (Patient taking differently: Take 60 mEq by mouth daily.), Disp: 180 tablet, Rfl: 11   predniSONE (DELTASONE) 10 MG tablet, Take 1 tablet (10 mg total) by mouth daily with breakfast., Disp: 90 tablet, Rfl: 1   quiniDINE gluconate 324 MG CR tablet, Take 1 tablet (324 mg total) by mouth 2 (two) times daily., Disp: 60 tablet, Rfl: 0   rosuvastatin (CRESTOR) 40 MG tablet, Take 1 tablet (40 mg total) by mouth at bedtime., Disp: 30 tablet, Rfl: 0   sildenafil (REVATIO) 20 MG tablet, Take 1 tablet (20 mg total) by mouth 3 (three) times daily., Disp: 90 tablet, Rfl: 6   spironolactone (ALDACTONE) 25 MG tablet, Take 1 tablet (25 mg total) by mouth daily., Disp: 45 tablet, Rfl: 3   torsemide (DEMADEX) 20 MG tablet, Take 1 tablet (20 mg total) by mouth every other day., Disp: 45 tablet, Rfl: 3   warfarin (COUMADIN) 2 MG tablet, Take 2 mg (1 tabs) every Tuesday/Thursday/Saturday and 4 mg (2 tabs) all other days or as directed by the [facility] (Patient taking differently: Take 2-4 mg by mouth See admin instructions. Take 4 mg on Mon, Wed, Fri, and Sun Take 2 mg on Tues, Thurs, and Sat), Disp: 180 tablet, Rfl: 5   zinc gluconate 50 MG tablet, Take 1 tablet (50 mg total) by mouth daily., Disp: 30 tablet, Rfl: 6   senna-docusate (SENOKOT-S) 8.6-50 MG tablet, Take 2 tablets by mouth at bedtime. For  AFTER surgery, do not take if having diarrhea, Disp: 30 tablet, Rfl: 0   traMADol (ULTRAM) 50 MG tablet, Take 1 tablet (50 mg total) by mouth every 6 (six) hours as needed for severe pain. For AFTER surgery only, do not take  and drive, Disp: 10 tablet, Rfl: 0  Review of Systems: Denies appetite changes, fevers, chills, fatigue, unexplained weight changes. Denies hearing loss, neck lumps or masses, mouth sores, ringing in ears or voice changes. Denies cough or wheezing.  Denies shortness of breath. Denies chest pain or palpitations. Denies leg swelling. Denies abdominal distention, pain, blood in stools, constipation, diarrhea, nausea, vomiting, or early satiety. Denies pain with intercourse, dysuria, frequency, hematuria or incontinence. Denies hot flashes, pelvic pain, vaginal bleeding or vaginal discharge.   Denies joint pain, back pain or muscle pain/cramps. Denies itching, rash, or wounds. Denies dizziness, headaches, numbness or seizures. Denies swollen lymph nodes or glands, denies easy bruising or bleeding. Denies anxiety, depression, confusion, or decreased concentration.  Physical Exam: BP 107/60 (BP Location: Right Arm, Patient Position: Sitting)   Pulse 87   Temp 98.3 F (36.8 C) (Oral)   Resp 16   Ht '5\' 3"'$  (1.6 m)   Wt 235 lb 3.2 oz (106.7 kg)   SpO2 100%   BMI 41.66 kg/m  General: Alert, oriented, no acute distress.  HEENT: Normocephalic, atraumatic. Sclera anicteric.  Chest: Clear to auscultation bilaterally. No wheezes, rhonchi, or rales. Cardiovascular: Mechanical hum present from LVAD. Abdomen: Obese. Normoactive bowel sounds. Soft, nondistended, nontender to palpation. No masses or hepatosplenomegaly appreciated. No palpable fluid wave.  Extremities: Grossly normal range of motion. Warm, well perfused. Trace edema bilaterally.   Laboratory & Radiologic Studies: CBC    Component Value Date/Time   WBC 9.5 12/31/2021 0933   RBC 3.34 (L) 12/31/2021 0933   HGB  9.1 (L) 12/31/2021 0933   HGB 10.1 (L) 12/25/2020 1132   HCT 30.2 (L) 12/31/2021 0933   HCT 33.8 (L) 12/25/2020 1132   PLT 319 12/31/2021 0933   PLT 430 12/25/2020 1132   MCV 90.4 12/31/2021 0933   MCV 73 (L) 12/25/2020 1132   MCV 85 09/23/2013 0443   MCH 27.2 12/31/2021 0933   MCHC 30.1 12/31/2021 0933   RDW 21.4 (H) 12/31/2021 0933   RDW 17.0 (H) 12/25/2020 1132   RDW 15.4 (H) 09/23/2013 0443   LYMPHSABS 1.2 12/24/2021 1630   LYMPHSABS 1.3 12/25/2020 1132   LYMPHSABS 2.0 09/23/2013 0443   MONOABS 0.7 12/24/2021 1630   MONOABS 0.4 09/23/2013 0443   EOSABS 0.0 12/24/2021 1630   EOSABS 0.0 12/25/2020 1132   EOSABS 0.1 09/23/2013 0443   BASOSABS 0.0 12/24/2021 1630   BASOSABS 0.0 12/25/2020 1132   BASOSABS 0.1 09/23/2013 0443      Latest Ref Rng & Units 01/07/2022   10:44 AM 12/31/2021    9:33 AM 12/24/2021    4:50 PM  BMP  Glucose 70 - 99 mg/dL 102  97  86   BUN 6 - 20 mg/dL '15  12  7   '$ Creatinine 0.44 - 1.00 mg/dL 1.28  1.41  1.10   Sodium 135 - 145 mmol/L 138  138  138   Potassium 3.5 - 5.1 mmol/L 4.2  4.4  3.4   Chloride 98 - 111 mmol/L 111  107  104   CO2 22 - 32 mmol/L 20  22    Calcium 8.9 - 10.3 mg/dL 9.2  9.3     Assessment & Plan: [name] is a 51 y.o. woman with complex cardiac history with complex adnexal mass, relatively stable in size over time as well as abnormal uterine bleeding.  Plan for definitive surgery with total hysterectomy and BSO.  Discussed with the patient plan for surgery next week.  She is getting admitted on Monday in anticipation of anticoagulation bridge in the setting of her LVAD.  Surgery is scheduled on Wednesday.  We have previously discussed options for surgery.  The patient would like to undergo bilateral salpingo-oophorectomy to avoid need for future adnexal surgery.  Enlarged adnexa will be placed in Endo Catch bag for controlled cyst drainage and removal.  Plan will be to send adnexal mass for frozen section.  If borderline tumor,  then we will proceed with BSO, total hysterectomy, and staging including omental biopsy and peritoneal biopsies.  If malignancy identified, decision will be made at the time of surgery about the safety and feasibility of lymph node biopsy.  We discussed the decision about total hysterectomy.  While removal of bilateral ovaries would place the patient into surgical menopause, she is interested in removing the uterus to avoid any future bleeding and/or need for surgery.  Discussed that this means increased risk related to bleeding and postoperative bleeding at the time of surgery.  Additionally, if the patient becomes symptomatic from a menopausal standpoint and it is acceptable to start hormone replacement therapy per cardiology, removal of her uterus would obviate the need for protected progesterone.  I think it is reasonable to move forward with plan for total hysterectomy.  Given the size of her uterus, we discussed that there is a risk that it will not fit out through the vagina.  Reviewed option of placing the uterus in a bag and performing contained morcellation versus extraction through a mini laparotomy on her abdomen.  The risks of surgery were discussed in detail and she understands these to include infection; wound separation; hernia; vaginal cuff separation, injury to adjacent organs such as bowel, bladder, blood vessels, ureters and nerves; bleeding which may require blood transfusion; anesthesia risk; thromboembolic events; possible death; unforeseen complications; possible need for re-exploration; medical complications such as heart attack, stroke, pleural effusion and pneumonia; and, if full lymphadenectomy is performed the risk of lymphedema and lymphocyst. The patient will receive DVT and antibiotic prophylaxis as indicated. She voiced a clear understanding. She had the opportunity to ask questions. Perioperative instructions were reviewed with her. Prescriptions for post-op medications were  sent to her pharmacy of choice.  32 minutes of total time was spent for this patient encounter, including preparation, face-to-face counseling with the patient and coordination of care, and documentation of the encounter.  Jeral Pinch, MD  Division of Gynecologic Oncology  Department of Obstetrics and Gynecology  [facility]

## 2022-01-30 NOTE — Patient Instructions (Signed)
Preparing for your Surgery  Plan for surgery on February 05, 2022 with Dr. Jeral Pinch at [place] [facility] will be scheduled for robotic assisted total laparoscopic hysterectomy (removal of the uterus and cervix), bilateral salpingo-oophorectomy (removal of both ovaries and fallopian tubes), possible staging if a cancer is identified, possible laparotomy (larger incision on your abdomen if needed).   You will need to be admitted before surgery under cardiology's service to prepare for surgery.  Pre-operative Testing -You will receive a phone call from presurgical testing at [facility] to arrange for a pre-operative appointment and lab work.  -Bring your insurance card, copy of an advanced directive if applicable, medication list  -At that visit, you will be asked to sign a consent for a possible blood transfusion in case a transfusion becomes necessary during surgery.  The need for a blood transfusion is rare but having consent is a necessary part of your care.     -Your blood thinner will be managed by cardiology.  -Do not take supplements such as fish oil (omega 3), red yeast rice, turmeric before your surgery. You want to avoid medications with aspirin in them including headache powders such as BC or Goody's), Excedrin migraine.  Day Before Surgery at [place] will be asked to take in a light diet the day before surgery. You will be advised you can have clear liquids up until 3 hours before your surgery.    Eat a light diet the day before surgery.  Examples including soups, broths, toast, yogurt, mashed potatoes.  AVOID GAS PRODUCING FOODS. Things to avoid include carbonated beverages (fizzy beverages, sodas), raw fruits and raw vegetables (uncooked), or beans.   If your bowels are filled with gas, your surgeon will have difficulty visualizing your pelvic organs which increases your surgical risks.  Your role in recovery Your role is to become active as soon as  directed by your doctor, while still giving yourself time to heal.  Rest when you feel tired. You will be asked to do the following in order to speed your recovery:  - Cough and breathe deeply. This helps to clear and expand your lungs and can prevent pneumonia after surgery.  - [place]. Do mild physical activity. Walking or moving your legs help your circulation and body functions return to normal. Do not try to get up or walk alone the first time after surgery.   -If you develop swelling on one leg or the other, pain in the back of your leg, redness/warmth in one of your legs, please call the office or go to the Emergency Room to have a doppler to rule out a blood clot. For shortness of breath, chest pain-seek care in the Emergency Room as soon as possible. - Actively manage your pain. Managing your pain lets you move in comfort. We will ask you to rate your pain on a scale of zero to 10. It is your responsibility to tell your doctor or nurse where and how much you hurt so your pain can be treated.  Special Considerations -If you are diabetic, you may be placed on insulin after surgery to have closer control over your blood sugars to promote healing and recovery.  This does not mean that you will be discharged on insulin.  If applicable, your oral antidiabetics will be resumed when you are tolerating a solid diet.  -Your final pathology results from surgery should be available around one week after surgery and the  results will be relayed to you when available.  -FMLA forms can be faxed to [phone] and please allow 5-7 business days for completion.  Pain Management After Surgery -You have been prescribed your pain medication and bowel regimen medications before surgery so that you can have these available when you are discharged from the hospital. The pain medication is for use ONLY AFTER surgery and a new prescription will not be given.   -Make sure that you have Tylenol  IF YOU ARE ABLE TO TAKE THESE MEDICATION at home to use on a regular basis after surgery for pain control.   -Review the attached handout on narcotic use and their risks and side effects.   Bowel Regimen -You have been prescribed Sennakot-S to take nightly to prevent constipation especially if you are taking the narcotic pain medication intermittently.  It is important to prevent constipation and drink adequate amounts of liquids. You can stop taking this medication when you are not taking pain medication and you are back on your normal bowel routine.  Risks of Surgery Risks of surgery are low but include bleeding, infection, damage to surrounding structures, re-operation, blood clots, and very rarely death.   Blood Transfusion Information (For the consent to be signed before surgery)  We will be checking your blood type before surgery so in case of emergencies, we will know what type of blood you would need.                                            WHAT IS A BLOOD TRANSFUSION?  A transfusion is the replacement of blood or some of its parts. Blood is made up of multiple cells which provide different functions. Red blood cells carry oxygen and are used for blood loss replacement. White blood cells fight against infection. Platelets control bleeding. Plasma helps clot blood. Other blood products are available for specialized needs, such as hemophilia or other clotting disorders. BEFORE THE TRANSFUSION  Who gives blood for transfusions?  You may be able to donate blood to be used at a later date on yourself (autologous donation). Relatives can be asked to donate blood. This is generally not any safer than if you have received blood from a stranger. The same precautions are taken to ensure safety when a relative's blood is donated. Healthy volunteers who are fully evaluated to make sure their blood is safe. This is blood bank blood. Transfusion therapy is the safest it has ever been in the  practice of medicine. Before blood is taken from a donor, a complete history is taken to make sure that person has no history of diseases nor engages in risky social behavior (examples are intravenous drug use or sexual activity with multiple partners). The donor's travel history is screened to minimize risk of transmitting infections, such as malaria. The donated blood is tested for signs of infectious diseases, such as HIV and hepatitis. The blood is then tested to be sure it is compatible with you in order to minimize the chance of a transfusion reaction. If you or a relative donates blood, this is often done in anticipation of surgery and is not appropriate for emergency situations. It takes many days to process the donated blood. RISKS AND COMPLICATIONS Although transfusion therapy is very safe and saves many lives, the main dangers of transfusion include:  Getting an infectious disease. Developing a transfusion reaction.  This is an allergic reaction to something in the blood you were given. Every precaution is taken to prevent this. The decision to have a blood transfusion has been considered carefully by your caregiver before blood is given. Blood is not given unless the benefits outweigh the risks.  AFTER SURGERY INSTRUCTIONS  Return to work: 4-6 weeks if applicable  Activity: 1. Be up and out of the bed during the day.  Take a nap if needed.  You may walk up steps but be careful and use the hand rail.  Stair climbing will tire you more than you think, you may need to stop part way and rest.   2. No lifting or straining for 6 weeks over 10 pounds. No pushing, pulling, straining for 6 weeks.  3. No driving for around 1 week(s) if you were cleared to drive before surgery.  Do not drive if you are taking narcotic pain medicine and make sure that your reaction time has returned.   4. You can shower as soon as the next day after surgery. Shower daily.  Use your regular soap and water (not  directly on the incision) and pat your incision(s) dry afterwards; don't rub.  No tub baths or submerging your body in water until cleared by your surgeon. If you have the soap that was given to you by pre-surgical testing that was used before surgery, you do not need to use it afterwards because this can irritate your incisions.   5. No sexual activity and nothing in the vagina for 8 weeks.  6. You may experience a small amount of clear drainage from your incisions, which is normal.  If the drainage persists, increases, or changes color please call the office.  7. Do not use creams, lotions, or ointments such as neosporin on your incisions after surgery until advised by your surgeon because they can cause removal of the dermabond glue on your incisions.    8. You may experience vaginal spotting after surgery or around the 6-8 week mark from surgery when the stitches at the top of the vagina begin to dissolve.  The spotting is normal but if you experience heavy bleeding, call our office.  9. Take Tylenol or ibuprofen first for pain if you are able to take these medications and only use narcotic pain medication for severe pain not relieved by the Tylenol or Ibuprofen.  Monitor your Tylenol intake to a max of 4,000 mg in a 24 hour period. You can alternate these medications after surgery.  Diet: 1. Low sodium Heart Healthy Diet is recommended but you are cleared to resume your normal (before surgery) diet after your procedure.  2. It is safe to use a laxative, such as Miralax or Colace, if you have difficulty moving your bowels. You have been prescribed Sennakot-S to take at bedtime every evening after surgery to keep bowel movements regular and to prevent constipation.    Wound Care: 1. Keep clean and dry.  Shower daily.  Reasons to call the Doctor: Fever - Oral temperature greater than 100.4 degrees Fahrenheit Foul-smelling vaginal discharge Difficulty urinating Nausea and vomiting Increased  pain at the site of the incision that is unrelieved with pain medicine. Difficulty breathing with or without chest pain New calf pain especially if only on one side Sudden, continuing increased vaginal bleeding with or without clots.   Contacts: For questions or concerns you should contact:  Dr. Jeral Pinch at [phone]  Joylene John, NP at [phone]  After Hours: call  [phone] and have the GYN Oncologist paged/contacted (after 5 pm or on the weekends).  Messages sent via mychart are for non-urgent matters and are not responded to after hours so for urgent needs, please call the after hours number.

## 2022-01-31 ENCOUNTER — Encounter: Payer: BC Managed Care – PPO | Admitting: *Deleted

## 2022-01-31 DIAGNOSIS — I5022 Chronic systolic (congestive) heart failure: Secondary | ICD-10-CM | POA: Diagnosis not present

## 2022-01-31 DIAGNOSIS — Z95811 Presence of heart assist device: Secondary | ICD-10-CM

## 2022-01-31 DIAGNOSIS — I5042 Chronic combined systolic (congestive) and diastolic (congestive) heart failure: Secondary | ICD-10-CM | POA: Diagnosis not present

## 2022-01-31 NOTE — Progress Notes (Signed)
Daily Session Note  Patient Details  Name: [name] MRN: [id number] Date of Birth: February 11, 1971 Referring Provider:   Flowsheet Row Cardiac Rehab from 11/13/2021 in [facility]  Referring Provider [name] MD       Encounter Date: 01/31/2022  Check In:      Social History   Tobacco Use  Smoking Status Never  Smokeless Tobacco Never    Goals Met:  Independence with exercise equipment Exercise tolerated well No report of concerns or symptoms today  Goals Unmet:  Not Applicable  Comments: Pt able to follow exercise prescription today without complaint.  Will continue to monitor for progression.    Dr. Emily Filbert is Medical Director for [place].  Dr. Ottie Glazier is Medical Director for [facility] Pulmonary Rehabilitation.

## 2022-02-03 ENCOUNTER — Inpatient Hospital Stay (HOSPITAL_COMMUNITY)
Admission: RE | Admit: 2022-02-03 | Discharge: 2022-02-08 | DRG: 742 | Disposition: A | Discharge status: Home / Self Care | Payer: BC Managed Care – PPO | Attending: Cardiology | Admitting: Cardiology

## 2022-02-03 ENCOUNTER — Other Ambulatory Visit (HOSPITAL_COMMUNITY): Payer: BC Managed Care – PPO

## 2022-02-03 ENCOUNTER — Encounter (HOSPITAL_COMMUNITY): Payer: Self-pay | Admitting: Cardiology

## 2022-02-03 ENCOUNTER — Other Ambulatory Visit: Payer: Self-pay

## 2022-02-03 DIAGNOSIS — E0590 Thyrotoxicosis, unspecified without thyrotoxic crisis or storm: Secondary | ICD-10-CM | POA: Diagnosis not present

## 2022-02-03 DIAGNOSIS — I480 Paroxysmal atrial fibrillation: Secondary | ICD-10-CM | POA: Diagnosis not present

## 2022-02-03 DIAGNOSIS — Z6841 Body Mass Index (BMI) 40.0 and over, adult: Secondary | ICD-10-CM

## 2022-02-03 DIAGNOSIS — N920 Excessive and frequent menstruation with regular cycle: Secondary | ICD-10-CM | POA: Diagnosis present

## 2022-02-03 DIAGNOSIS — G8191 Hemiplegia, unspecified affecting right dominant side: Secondary | ICD-10-CM | POA: Diagnosis present

## 2022-02-03 DIAGNOSIS — D72829 Elevated white blood cell count, unspecified: Secondary | ICD-10-CM | POA: Diagnosis not present

## 2022-02-03 DIAGNOSIS — Z7901 Long term (current) use of anticoagulants: Secondary | ICD-10-CM

## 2022-02-03 DIAGNOSIS — I472 Ventricular tachycardia, unspecified: Secondary | ICD-10-CM | POA: Diagnosis present

## 2022-02-03 DIAGNOSIS — Z823 Family history of stroke: Secondary | ICD-10-CM

## 2022-02-03 DIAGNOSIS — Z95810 Presence of automatic (implantable) cardiac defibrillator: Secondary | ICD-10-CM | POA: Diagnosis not present

## 2022-02-03 DIAGNOSIS — Z95811 Presence of heart assist device: Secondary | ICD-10-CM

## 2022-02-03 DIAGNOSIS — N852 Hypertrophy of uterus: Secondary | ICD-10-CM | POA: Diagnosis present

## 2022-02-03 DIAGNOSIS — I493 Ventricular premature depolarization: Secondary | ICD-10-CM | POA: Diagnosis present

## 2022-02-03 DIAGNOSIS — K660 Peritoneal adhesions (postprocedural) (postinfection): Secondary | ICD-10-CM | POA: Diagnosis present

## 2022-02-03 DIAGNOSIS — D251 Intramural leiomyoma of uterus: Secondary | ICD-10-CM | POA: Diagnosis not present

## 2022-02-03 DIAGNOSIS — I5022 Chronic systolic (congestive) heart failure: Secondary | ICD-10-CM | POA: Diagnosis not present

## 2022-02-03 DIAGNOSIS — R140 Abdominal distension (gaseous): Secondary | ICD-10-CM | POA: Diagnosis not present

## 2022-02-03 DIAGNOSIS — N9489 Other specified conditions associated with female genital organs and menstrual cycle: Secondary | ICD-10-CM

## 2022-02-03 DIAGNOSIS — E46 Unspecified protein-calorie malnutrition: Secondary | ICD-10-CM | POA: Diagnosis not present

## 2022-02-03 DIAGNOSIS — I428 Other cardiomyopathies: Secondary | ICD-10-CM | POA: Diagnosis present

## 2022-02-03 DIAGNOSIS — D259 Leiomyoma of uterus, unspecified: Secondary | ICD-10-CM | POA: Diagnosis not present

## 2022-02-03 DIAGNOSIS — D271 Benign neoplasm of left ovary: Principal | ICD-10-CM | POA: Diagnosis present

## 2022-02-03 DIAGNOSIS — R928 Other abnormal and inconclusive findings on diagnostic imaging of breast: Secondary | ICD-10-CM

## 2022-02-03 DIAGNOSIS — N1831 Chronic kidney disease, stage 3a: Secondary | ICD-10-CM | POA: Diagnosis present

## 2022-02-03 DIAGNOSIS — D279 Benign neoplasm of unspecified ovary: Secondary | ICD-10-CM | POA: Diagnosis not present

## 2022-02-03 DIAGNOSIS — Z79890 Hormone replacement therapy (postmenopausal): Secondary | ICD-10-CM

## 2022-02-03 DIAGNOSIS — I252 Old myocardial infarction: Secondary | ICD-10-CM

## 2022-02-03 DIAGNOSIS — Z79899 Other long term (current) drug therapy: Secondary | ICD-10-CM

## 2022-02-03 DIAGNOSIS — Z95820 Peripheral vascular angioplasty status with implants and grafts: Secondary | ICD-10-CM | POA: Diagnosis not present

## 2022-02-03 DIAGNOSIS — Z8711 Personal history of peptic ulcer disease: Secondary | ICD-10-CM

## 2022-02-03 DIAGNOSIS — C561 Malignant neoplasm of right ovary: Secondary | ICD-10-CM | POA: Diagnosis not present

## 2022-02-03 DIAGNOSIS — N939 Abnormal uterine and vaginal bleeding, unspecified: Secondary | ICD-10-CM

## 2022-02-03 DIAGNOSIS — N859 Noninflammatory disorder of uterus, unspecified: Secondary | ICD-10-CM | POA: Diagnosis present

## 2022-02-03 DIAGNOSIS — Z7982 Long term (current) use of aspirin: Secondary | ICD-10-CM

## 2022-02-03 DIAGNOSIS — I2721 Secondary pulmonary arterial hypertension: Secondary | ICD-10-CM | POA: Diagnosis present

## 2022-02-03 DIAGNOSIS — I130 Hypertensive heart and chronic kidney disease with heart failure and stage 1 through stage 4 chronic kidney disease, or unspecified chronic kidney disease: Secondary | ICD-10-CM | POA: Diagnosis present

## 2022-02-03 DIAGNOSIS — R8769 Abnormal cytological findings in specimens from other female genital organs: Secondary | ICD-10-CM | POA: Diagnosis not present

## 2022-02-03 DIAGNOSIS — N838 Other noninflammatory disorders of ovary, fallopian tube and broad ligament: Secondary | ICD-10-CM | POA: Diagnosis not present

## 2022-02-03 DIAGNOSIS — Z8249 Family history of ischemic heart disease and other diseases of the circulatory system: Secondary | ICD-10-CM

## 2022-02-03 DIAGNOSIS — D631 Anemia in chronic kidney disease: Secondary | ICD-10-CM | POA: Diagnosis not present

## 2022-02-03 DIAGNOSIS — Z01818 Encounter for other preprocedural examination: Secondary | ICD-10-CM | POA: Diagnosis not present

## 2022-02-03 LAB — CBC WITH DIFFERENTIAL/PLATELET
Abs Immature Granulocytes: 0.02 10*3/uL (ref 0.00–0.07)
Basophils Absolute: 0 10*3/uL (ref 0.0–0.1)
Basophils Relative: 1 %
Eosinophils Absolute: 0 10*3/uL (ref 0.0–0.5)
Eosinophils Relative: 0 %
HCT: 35.3 % — ABNORMAL LOW (ref 36.0–46.0)
Hemoglobin: 11.1 g/dL — ABNORMAL LOW (ref 12.0–15.0)
Immature Granulocytes: 0 %
Lymphocytes Relative: 19 %
Lymphs Abs: 1 10*3/uL (ref 0.7–4.0)
MCH: 26.4 pg (ref 26.0–34.0)
MCHC: 31.4 g/dL (ref 30.0–36.0)
MCV: 84 fL (ref 80.0–100.0)
Monocytes Absolute: 0.5 10*3/uL (ref 0.1–1.0)
Monocytes Relative: 11 %
Neutro Abs: 3.4 10*3/uL (ref 1.7–7.7)
Neutrophils Relative %: 69 %
Platelets: 280 10*3/uL (ref 150–400)
RBC: 4.2 MIL/uL (ref 3.87–5.11)
RDW: 15.6 % — ABNORMAL HIGH (ref 11.5–15.5)
WBC: 5 10*3/uL (ref 4.0–10.5)
nRBC: 0 % (ref 0.0–0.2)

## 2022-02-03 LAB — COMPREHENSIVE METABOLIC PANEL
ALT: 13 U/L (ref 0–44)
AST: 17 U/L (ref 15–41)
Albumin: 3.7 g/dL (ref 3.5–5.0)
Alkaline Phosphatase: 61 U/L (ref 38–126)
Anion gap: 7 (ref 5–15)
BUN: 14 mg/dL (ref 6–20)
CO2: 25 mmol/L (ref 22–32)
Calcium: 9 mg/dL (ref 8.9–10.3)
Chloride: 108 mmol/L (ref 98–111)
Creatinine, Ser: 1.42 mg/dL — ABNORMAL HIGH (ref 0.44–1.00)
GFR, Estimated: 45 mL/min — ABNORMAL LOW (ref >60)
Glucose, Bld: 109 mg/dL — ABNORMAL HIGH (ref 70–99)
Potassium: 4.2 mmol/L (ref 3.5–5.1)
Sodium: 140 mmol/L (ref 135–145)
Total Bilirubin: 0.5 mg/dL (ref 0.3–1.2)
Total Protein: 6.5 g/dL (ref 6.5–8.1)

## 2022-02-03 LAB — MRSA NEXT GEN BY PCR, NASAL: MRSA by PCR Next Gen: NOT DETECTED

## 2022-02-03 LAB — LACTATE DEHYDROGENASE: LDH: 207 U/L — ABNORMAL HIGH (ref 98–192)

## 2022-02-03 LAB — HEPARIN LEVEL (UNFRACTIONATED): Heparin Unfractionated: <0.1 IU/mL — ABNORMAL LOW (ref 0.30–0.70)

## 2022-02-03 LAB — PROTIME-INR
INR: 1.2 (ref 0.8–1.2)
Prothrombin Time: 14.9 seconds (ref 11.4–15.2)

## 2022-02-03 MED ORDER — SODIUM CHLORIDE 0.9% FLUSH: 3.0000 mL | INTRAVENOUS | Status: DC | PRN

## 2022-02-03 MED ORDER — QUINIDINE GLUCONATE ER 324 MG PO TBCR
324.0000 mg | EXTENDED_RELEASE_TABLET | Freq: Two times a day (BID) | ORAL | Status: DC
Start: 2022-02-03 — End: 2022-02-08
  Administered 2022-02-03 – 2022-02-08 (×9): 324 mg via ORAL
  Filled 2022-02-03 (×15): qty 1

## 2022-02-03 MED ORDER — SENNOSIDES-DOCUSATE SODIUM 8.6-50 MG PO TABS
2.0000 | ORAL_TABLET | Freq: Every day | ORAL | Status: DC
  Administered 2022-02-05 – 2022-02-06 (×2): 2 via ORAL
  Filled 2022-02-03 (×5): qty 2

## 2022-02-03 MED ORDER — ASPIRIN 81 MG PO TBEC
81.0000 mg | DELAYED_RELEASE_TABLET | Freq: Every day | ORAL | Status: DC
  Administered 2022-02-04 – 2022-02-08 (×5): 81 mg via ORAL
  Filled 2022-02-03 (×6): qty 1

## 2022-02-03 MED ORDER — ROSUVASTATIN CALCIUM 20 MG PO TABS
40.0000 mg | ORAL_TABLET | Freq: Every day | ORAL | Status: DC
  Administered 2022-02-03 – 2022-02-07 (×5): 40 mg via ORAL
  Filled 2022-02-03 (×5): qty 2

## 2022-02-03 MED ORDER — HEPARIN (PORCINE) 25000 UT/250ML-% IV SOLN
400.0000 [IU]/h | INTRAVENOUS | Status: AC
  Administered 2022-02-03: 300 [IU]/h via INTRAVENOUS
  Filled 2022-02-03: qty 250

## 2022-02-03 MED ORDER — QUINIDINE GLUCONATE ER 324 MG PO TBCR
324.0000 mg | EXTENDED_RELEASE_TABLET | Freq: Two times a day (BID) | ORAL | Status: DC
  Filled 2022-02-03: qty 1

## 2022-02-03 MED ORDER — SODIUM CHLORIDE 0.9 % IV SOLN: 250.0000 mL | INTRAVENOUS | Status: DC | PRN

## 2022-02-03 MED ORDER — POTASSIUM CHLORIDE CRYS ER 20 MEQ PO TBCR
20.0000 meq | EXTENDED_RELEASE_TABLET | Freq: Every day | ORAL | Status: DC
  Administered 2022-02-03 – 2022-02-08 (×6): 20 meq via ORAL
  Filled 2022-02-03 (×6): qty 1

## 2022-02-03 MED ORDER — SILDENAFIL CITRATE 20 MG PO TABS: 20.0000 mg | ORAL_TABLET | Freq: Three times a day (TID) | ORAL | Status: DC

## 2022-02-03 MED ORDER — SPIRONOLACTONE 25 MG PO TABS
25.0000 mg | ORAL_TABLET | Freq: Every day | ORAL | Status: DC
  Administered 2022-02-04 – 2022-02-06 (×3): 25 mg via ORAL
  Filled 2022-02-03 (×4): qty 1

## 2022-02-03 MED ORDER — TORSEMIDE 20 MG PO TABS
20.0000 mg | ORAL_TABLET | Freq: Every day | ORAL | Status: DC
  Administered 2022-02-03 – 2022-02-06 (×4): 20 mg via ORAL
  Filled 2022-02-03 (×4): qty 1

## 2022-02-03 MED ORDER — ZINC SULFATE 220 (50 ZN) MG PO CAPS
220.0000 mg | ORAL_CAPSULE | Freq: Every day | ORAL | Status: DC
  Administered 2022-02-04 – 2022-02-08 (×5): 220 mg via ORAL
  Filled 2022-02-03 (×5): qty 1

## 2022-02-03 MED ORDER — PREDNISONE 10 MG PO TABS
10.0000 mg | ORAL_TABLET | Freq: Every day | ORAL | Status: DC
  Administered 2022-02-04 – 2022-02-08 (×4): 10 mg via ORAL
  Filled 2022-02-03 (×4): qty 1

## 2022-02-03 MED ORDER — SODIUM CHLORIDE 0.9% FLUSH
3.0000 mL | Freq: Two times a day (BID) | INTRAVENOUS | Status: DC
  Administered 2022-02-03 – 2022-02-08 (×7): 3 mL via INTRAVENOUS

## 2022-02-03 MED ORDER — METHIMAZOLE 10 MG PO TABS: 10.0000 mg | ORAL_TABLET | Freq: Two times a day (BID) | ORAL | Status: DC

## 2022-02-03 MED ORDER — SILDENAFIL CITRATE 20 MG PO TABS
20.0000 mg | ORAL_TABLET | Freq: Three times a day (TID) | ORAL | Status: DC
Start: 2022-02-03 — End: 2022-02-08
  Administered 2022-02-03 – 2022-02-08 (×15): 20 mg via ORAL
  Filled 2022-02-03 (×15): qty 1

## 2022-02-03 MED ORDER — ONDANSETRON HCL 4 MG/2ML IJ SOLN
4.0000 mg | Freq: Four times a day (QID) | INTRAMUSCULAR | Status: DC | PRN
  Administered 2022-02-07: 4 mg via INTRAVENOUS
  Filled 2022-02-03: qty 2

## 2022-02-03 MED ORDER — METHIMAZOLE 10 MG PO TABS
10.0000 mg | ORAL_TABLET | Freq: Two times a day (BID) | ORAL | Status: DC
  Administered 2022-02-03 – 2022-02-08 (×9): 10 mg via ORAL
  Filled 2022-02-03 (×9): qty 1

## 2022-02-03 MED ORDER — ACETAMINOPHEN 325 MG PO TABS: 650.0000 mg | ORAL_TABLET | ORAL | Status: DC | PRN

## 2022-02-03 NOTE — Progress Notes (Signed)
ANTICOAGULATION CONSULT NOTE - Follow Up Consult  Pharmacy Consult for heparin Indication:  LVAD  No Known Allergies  Patient Measurements: Height: '5\' 3"'$  (160 cm) Weight: 107.7 kg (237 lb 7 oz) IBW/kg (Calculated) : 52.4 Heparin Dosing Weight: 78kg  Vital Signs: Temp: 98.3 F (36.8 C) (08/14 2019) Temp Source: Oral (08/14 2019) BP: 97/81 (08/14 2019) Pulse Rate: 80 (08/14 2019)  Labs: Recent Labs    02/03/22 1049 02/03/22 1948  HGB 11.1*  --   HCT 35.3*  --   PLT 280  --   LABPROT 14.9  --   INR 1.2  --   HEPARINUNFRC  --  <0.10*  CREATININE 1.42*  --     Estimated Creatinine Clearance: 55.7 mL/min (A) (by C-G formula based on SCr of 1.42 mg/dL (H)).   Medical History: Past Medical History:  Diagnosis Date   Automatic implantable cardioverter-defibrillator in situ    Chronic CHF (congestive heart failure) ([name])    a. EF 15-20% b. RHC (09/2013) RA 14, RV 57/22, PA 64/36 (48), PCWP 18, FIck CO/CI 3.7/1.6, PVR 8.1 WU, PA sat 47%    History of hypothyroidism    History of stomach ulcers    Hyperthyroidism    Hypotension    Morbid obesity ([place])    Myocardial infarction ([place]) 08/2013   Nocturnal dyspnea    Nonischemic cardiomyopathy ([place])    Sinus tachycardia    Sleep apnea    Snoring-prob OSA 09/04/2011   [employer] ICD lead RECALL  6949    Stroke ([facility]) 2021   some residual right-sided weakness   UARS (upper airway resistance syndrome) 09/04/2011   HST 12/2013:  AHI 4/hr (numerous episodes of airflow reduction that did not have concomitant desaturation)    Assessment: 51 year old female with LVAD implanted earlier this year. Patient being admitted for total hysterectomy on 8/16. Last dose of warfarin was 8/10, lovenox bridge was started with last dose given 8/13pm. Will start low dose heparin bridge today with INR of 1.2. Hemoglobin 11s, LDH low 200s. No overt bleeding issues noted.   Looking back at history patient was at the low end of therapeutic  (0.2-0.3 units/ml) on 300 units/hr of heparin during previous stay. Will start at this lower rate and discuss with team whether titrations are needed.  Heparin drip 300 uts/he Heparin level < 0.1 - no changes tonight   Goal of Therapy:  Heparin level goal <0.3 Monitor platelets by anticoagulation protocol: Yes   Plan:  Continue heparin infusion at 300 units/hr - Daily heparin level and CBC  Monitor s/s bleeding     Bonnita Nasuti Pharm.D. CPP, BCPS Clinical Pharmacist [phone] 02/03/2022 9:52 PM

## 2022-02-03 NOTE — Progress Notes (Addendum)
LVAD Coordinator Rounding Note:  Planned admission 02/03/22 to [facility] for heparin bridge per Dr [name]. Pt posted for total hysterectomy.  Patient just arrived to unit. Sitting up in bed. Denies any vaginal bleeding currently. Last dose of Coumadin was Thursday. Pt took 3 doses of Lovenox over the weekend.   Pt posted for total hysterectomy on Wednesday.  Vital signs: Temp: 98.3 HR: 88 Doppler Pressure: 70 Auto BP:  89/62(70) O2 Sat: 92% on RA  Wt:  237.44 lbs  LVAD interrogation:  Speed: 5800 Flow: 5.2 Power: 4.6w PI: 2.7 Hct: 20-DO NOT ADJUST  Alarms: none Events: 5-10 daily   Fixed speed: 5800 Low speed limit: 5500  Drive Line: CDI. Drive line anchor secure. Weekly dressing changes by bedside nurse. Next dressing change due: 02/10/22.    Labs:  LDH trend:  207  INR trend:  1.2  Hgb: 11.1   Anticoagulation Plan: -INR Goal: 2.0 - 2.5-holding for surgery on Wednesday -ASA Dose: 81 mg  -Heparin gtt 300 u/hr  Device: Medtronic single ICD - Therapies: on 200 bpm - Pacing:  VVI 40 -  Last check 09/18/21 (therapy turned on after surgery/transfer to [facility])          Plan/Recommendations:  1. Page VAD coordinators with VAD equipment and drive line concerns. 2. Weekly dressing change per VAD coordinator or caregiver. Next dressing change due 02/10/22. 3. VAD coordinator will accompany pt to OR on Wednesday.  Tanda Rockers RN [place] Coordinator  Office: [phone]  24/7 Pager: [phone]

## 2022-02-03 NOTE — H&P (Addendum)
Advanced Heart Failure VAD History and Physical Note   PCP-Cardiologist: [name], MD   Reason for Admission:  Heparin Bridge for hysterectomy   HPI:    [name] is a 51 year old with h/o HFrEF, HMIII LVAD 2023, Medtronic ICD, obesity, PVCs/VT, hyperthyroidism, CVA cardioembolic,  MR, and left ovarian mass.  VT 2020--> hypokalemia VF shock 2021-->hypokalemia VT 2022 --> quinidine added by EP.   Admitted March 2023 with recurrent CHF exacerbation with low cardiac output.  She was also noted to be hyperthyroidism but amiodarone had to be continued due to VT and poorly tolerated atrial fibrillation.  She required Impella 5.5 placement and eventual HM3 LVAD placement on 09/04/21.  Post-op course complicated by atrial fibrillation, malnutrition, and deconditioning. Ramp ECHO speed optimized, 5800.    Admitted 12/24/2021 with heavy vaginal bleeding. Given 3UPRBCS and Iron transfusions. Started on provera.  Admitted for scheduled robotic hysterectomy. Last dose of coumadin was Thursday and had lovenox x 3 doses.  Last dose 02/02/22. Bleeding stopped after coumadin was stopped.  Drive line is not incorporated. Able to walk 1 mile at cardiac rehab. No fever of chills. Denies SOB.   LVAD INTERROGATION:  HeartMate III LVAD:  Flow 5.2 liters/min, speed 5800, power 4.6, PI 2.8 Rare PI events.  Review of Systems: [y] = yes, '[ ]'$  = no   General: Weight gain '[ ]'$ ; Weight loss '[ ]'$ ; Anorexia '[ ]'$ ; Fatigue '[ ]'$ ; Fever '[ ]'$ ; Chills '[ ]'$ ; Weakness '[ ]'$   Cardiac: Chest pain/pressure '[ ]'$ ; Resting SOB '[ ]'$ ; Exertional SOB '[ ]'$ ; Orthopnea '[ ]'$ ; Pedal Edema '[ ]'$ ; Palpitations '[ ]'$ ; Syncope '[ ]'$ ; Presyncope '[ ]'$ ; Paroxysmal nocturnal dyspnea'[ ]'$   Pulmonary: Cough '[ ]'$ ; Wheezing'[ ]'$ ; Hemoptysis'[ ]'$ ; Sputum '[ ]'$ ; Snoring '[ ]'$   GI: Vomiting'[ ]'$ ; Dysphagia'[ ]'$ ; Melena'[ ]'$ ; Hematochezia '[ ]'$ ; Heartburn'[ ]'$ ; Abdominal pain '[ ]'$ ; Constipation '[ ]'$ ; Diarrhea '[ ]'$ ; BRBPR '[ ]'$   GU: Hematuria'[ ]'$ ; Dysuria '[ ]'$ ; Nocturia'[ ]'$   Vascular: Pain in legs  with walking '[ ]'$ ; Pain in feet with lying flat '[ ]'$ ; Non-healing sores '[ ]'$ ; Stroke '[ ]'$ ; TIA '[ ]'$ ; Slurred speech '[ ]'$ ;  Neuro: Headaches'[ ]'$ ; Vertigo'[ ]'$ ; Seizures'[ ]'$ ; Paresthesias'[ ]'$ ;Blurred vision '[ ]'$ ; Diplopia '[ ]'$ ; Vision changes '[ ]'$   Ortho/Skin: Arthritis '[ ]'$ ; Joint pain [ Y]; Muscle pain '[ ]'$ ; Joint swelling '[ ]'$ ; Back Pain [Y ]; Rash '[ ]'$   Psych: Depression'[ ]'$ ; Anxiety'[ ]'$   Heme: Bleeding problems '[ ]'$ ; Clotting disorders '[ ]'$ ; Anemia [ Y]  Endocrine: Diabetes '[ ]'$ ; Thyroid dysfunction[ Y]    Home Medications Prior to Admission medications   Medication Sig Start Date End Date Taking? Authorizing Provider  aspirin EC 81 MG tablet Take 1 tablet (81 mg total) by mouth daily. Swallow whole. 10/08/21  Yes Larey Dresser, MD  methimazole (TAPAZOLE) 10 MG tablet Take 1 tablet (10 mg total) by mouth 2 (two) times daily. 12/26/21  Yes Shamleffer, Melanie Crazier, MD  potassium chloride SA (KLOR-CON M) 20 MEQ tablet Take 3 tablets (60 mEq total) by mouth 2 (two) times daily. Patient taking differently: Take 60 mEq by mouth daily. 12/19/21  Yes Larey Dresser, MD  predniSONE (DELTASONE) 10 MG tablet Take 1 tablet (10 mg total) by mouth daily with breakfast. 12/26/21  Yes Shamleffer, Melanie Crazier, MD  quiniDINE gluconate 324 MG CR tablet Take 1 tablet (324 mg total) by mouth 2 (two) times daily. 09/25/21  Yes Angiulli, Lavon Paganini, PA-C  rosuvastatin (CRESTOR) 40 MG tablet Take 1 tablet (40 mg total) by mouth at bedtime. 09/25/21  Yes Angiulli, Lavon Paganini, PA-C  sildenafil (REVATIO) 20 MG tablet Take 1 tablet (20 mg total) by mouth 3 (three) times daily. 10/28/21  Yes Larey Dresser, MD  spironolactone (ALDACTONE) 25 MG tablet Take 1 tablet (25 mg total) by mouth daily. 12/31/21 12/26/22 Yes Larey Dresser, MD  torsemide (DEMADEX) 20 MG tablet Take 1 tablet (20 mg total) by mouth every other day. 12/31/21  Yes Larey Dresser, MD  warfarin (COUMADIN) 2 MG tablet Take 2 mg (1 tabs) every Tuesday/Thursday/Saturday and 4 mg  (2 tabs) all other days or as directed by the [facility] Patient taking differently: Take 2-4 mg by mouth See admin instructions. Take 4 mg on Mon, Wed, Fri, and Sun Take 2 mg on Tues, Thurs, and Sat 01/16/22  Yes Larey Dresser, MD  zinc gluconate 50 MG tablet Take 1 tablet (50 mg total) by mouth daily. 10/07/21  Yes Larey Dresser, MD  medroxyPROGESTERone (PROVERA) 10 MG tablet Take 1-2 tablets (10-20 mg total) by mouth See admin instructions. Take 10 mg daily, may take a second 10 mg dose as needed for heavy bleeding 01/29/22   Lafonda Mosses, MD  senna-docusate (SENOKOT-S) 8.6-50 MG tablet Take 2 tablets by mouth at bedtime. For AFTER surgery, do not take if having diarrhea 01/30/22   Cross, Lenna Sciara D, NP  traMADol (ULTRAM) 50 MG tablet Take 1 tablet (50 mg total) by mouth every 6 (six) hours as needed for severe pain. For AFTER surgery only, do not take and drive 09/30/79   Joylene John D, NP    Past Medical History: Past Medical History:  Diagnosis Date   Automatic implantable cardioverter-defibrillator in situ    Chronic CHF (congestive heart failure) ([place])    a. EF 15-20% b. RHC (09/2013) RA 14, RV 57/22, PA 64/36 (48), PCWP 18, FIck CO/CI 3.7/1.6, PVR 8.1 WU, PA sat 47%    History of hypothyroidism    History of stomach ulcers    Hyperthyroidism    Hypotension    Morbid obesity ([place])    Myocardial infarction ([place]) 08/2013   Nocturnal dyspnea    Nonischemic cardiomyopathy ([place])    Sinus tachycardia    Sleep apnea    Snoring-prob OSA 09/04/2011   Sprint Fidelis ICD lead RECALL  6949    Stroke ([place]) 2021   some residual right-sided weakness   UARS (upper airway resistance syndrome) 09/04/2011   HST 12/2013:  AHI 4/hr (numerous episodes of airflow reduction that did not have concomitant desaturation)     Past Surgical History: Past Surgical History:  Procedure Laterality Date   BREATH TEK H PYLORI N/A 11/09/2014   Procedure: BREATH TEK H PYLORI;   Surgeon: Greer Pickerel, MD;  Location: [facility];  Service: General;  Laterality: N/A;   CARDIAC CATHETERIZATION  ~ 2006; 09/2013   CARDIAC CATHETERIZATION N/A 05/23/2016   Procedure: Right Heart Cath;  Surgeon: Larey Dresser, MD;  Location: [facility];  Service: Cardiovascular;  Laterality: N/A;   CARDIAC DEFIBRILLATOR PLACEMENT  2006; 12/26/2013   Medtronic [id number]; 12-2013 ICD gen change and RV lead revision with new 6935 RV lead by Dr [name]   CENTRAL LINE INSERTION  08/28/2021   Procedure: CENTRAL LINE INSERTION;  Surgeon: Larey Dresser, MD;  Location: [facility];  Service: Cardiovascular;;   CESAREAN SECTION  1999   IMPLANTABLE CARDIOVERTER  DEFIBRILLATOR GENERATOR CHANGE N/A 12/26/2013   Procedure: IMPLANTABLE CARDIOVERTER DEFIBRILLATOR GENERATOR CHANGE;  Surgeon: Deboraha Sprang, MD;  Location: [facility];  Service: Cardiovascular;  Laterality: N/A;   INSERTION OF IMPLANTABLE LEFT VENTRICULAR ASSIST DEVICE N/A 09/03/2021   Procedure: INSERTION OF IMPLANTABLE LEFT VENTRICULAR ASSIST DEVICE/ Heartmate 3;  Surgeon: Dahlia Byes, MD;  Location: [facility];  Service: Open Heart Surgery;  Laterality: N/A;   IR CT HEAD LTD  04/17/2020   IR CT HEAD LTD  04/17/2020   IR INTRA CRAN STENT  04/17/2020   IR PERCUTANEOUS ART THROMBECTOMY/INFUSION INTRACRANIAL INC DIAG ANGIO  04/17/2020   IR RADIOLOGIST EVAL & MGMT  06/05/2020   LEAD REVISION N/A 12/26/2013   Procedure: LEAD REVISION;  Surgeon: Deboraha Sprang, MD;  Location: [facility] CATH LAB;  Service: Cardiovascular;  Laterality: N/A;   PLACEMENT OF IMPELLA LEFT VENTRICULAR ASSIST DEVICE Right 08/29/2021   Procedure: PLACEMENT OF IMPELLA 5.5 LEFT VENTRICULAR ASSIST DEVICE;  Surgeon: Gaye Pollack, MD;  Location: [facility];  Service: Open Heart Surgery;  Laterality: Right;   RADIOLOGY WITH ANESTHESIA N/A 04/17/2020   Procedure: Code Stroke;  Surgeon: Luanne Bras, MD;  Location: [facility];  Service: Radiology;  Laterality: N/A;   REMOVAL OF  IMPELLA LEFT VENTRICULAR ASSIST DEVICE N/A 09/03/2021   Procedure: REMOVAL OF IMPELLA LEFT VENTRICULAR ASSIST DEVICE;  Surgeon: Dahlia Byes, MD;  Location: [facility];  Service: Open Heart Surgery;  Laterality: N/A;   RIGHT HEART CATH N/A 08/28/2021   Procedure: RIGHT HEART CATH;  Surgeon: Larey Dresser, MD;  Location: [facility];  Service: Cardiovascular;  Laterality: N/A;   RIGHT HEART CATHETERIZATION N/A 02/15/2014   Procedure: RIGHT HEART CATH;  Surgeon: Larey Dresser, MD;  Location: [facility];  Service: Cardiovascular;  Laterality: N/A;   TEE WITHOUT CARDIOVERSION N/A 08/29/2021   Procedure: TRANSESOPHAGEAL ECHOCARDIOGRAM (TEE);  Surgeon: Gaye Pollack, MD;  Location: [facility];  Service: Open Heart Surgery;  Laterality: N/A;   TEE WITHOUT CARDIOVERSION N/A 09/03/2021   Procedure: TRANSESOPHAGEAL ECHOCARDIOGRAM (TEE);  Surgeon: Dahlia Byes, MD;  Location: [facility];  Service: Open Heart Surgery;  Laterality: N/A;   TUBAL LIGATION  1999    Family History: Family History  Problem Relation Age of Onset   Heart disease Mother    Obesity Mother    High blood pressure Father    Stroke Father    Heart disease Maternal Grandmother    Colon cancer Neg Hx    Breast cancer Neg Hx    Ovarian cancer Neg Hx    Endometrial cancer Neg Hx    Pancreatic cancer Neg Hx    Prostate cancer Neg Hx     Social History: Social History   Socioeconomic History   Marital status: Married    Spouse name: Tahjae Durr   Number of children: 2   Years of education: Not on file   Highest education level: Not on file  Occupational History   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  Tobacco Use   Smoking status: Never   Smokeless tobacco: Never  Vaping Use   Vaping Use: Never used  Substance and Sexual Activity   Alcohol use: No   Drug use: No   Sexual activity: Not Currently  Other Topics Concern   Not on file  Social History Narrative   [place] sometimes   Right handed   Social  Determinants of Health   Financial Resource Strain: Low Risk  (08/28/2021)  Overall Financial Resource Strain (CARDIA)    Difficulty of Paying Living Expenses: Not very hard  Food Insecurity: No Food Insecurity (08/28/2021)   Hunger Vital Sign    Worried About Running Out of Food in the Last Year: Never true    Ran Out of Food in the Last Year: Never true  Transportation Needs: No Transportation Needs (08/28/2021)   PRAPARE - Hydrologist (Medical): No    Lack of Transportation (Non-Medical): No  Physical Activity: Not on file  Stress: Not on file  Social Connections: Not on file    Allergies:  No Known Allergies  Objective:    Vital Signs:   Temp:  [98.3 F (36.8 C)] 98.3 F (36.8 C) (08/14 1013) Pulse Rate:  [97] 97 (08/14 1013) SpO2:  [92 %] 92 % (08/14 1013) Weight:  [107.7 kg] 107.7 kg (08/14 1013) Last BM Date : 02/03/22 Filed Weights   02/03/22 1013  Weight: 107.7 kg    Mean arterial Pressure 70  Physical Exam    General:  Well appearing. No resp difficulty HEENT: Normal Neck: supple. JVP  flat. Carotids 2+ bilat; no bruits. No lymphadenopathy or thyromegaly appreciated. Cor: Mechanical heart sounds with LVAD hum present. Lungs: Clear Abdomen: soft, nontender, nondistended. No hepatosplenomegaly. No bruits or masses. Good bowel sounds. Driveline: C/D/I; securement device intact and driveline incorporated Extremities: no cyanosis, clubbing, rash, edema Neuro: alert & orientedx3, cranial nerves grossly intact. moves all 4 extremities w/o difficulty. Affect pleasant   Telemetry   SR 90s   EKG   N/A   Labs    Basic Metabolic Panel: No results for input(s): "NA", "K", "CL", "CO2", "GLUCOSE", "BUN", "CREATININE", "CALCIUM", "MG", "PHOS" in the last 168 hours.  Liver Function Tests: No results for input(s): "AST", "ALT", "ALKPHOS", "BILITOT", "PROT", "ALBUMIN" in the last 168 hours. No results for input(s): "LIPASE", "AMYLASE"  in the last 168 hours. No results for input(s): "AMMONIA" in the last 168 hours.  CBC: No results for input(s): "WBC", "NEUTROABS", "HGB", "HCT", "MCV", "PLT" in the last 168 hours.  Cardiac Enzymes: No results for input(s): "CKTOTAL", "CKMB", "CKMBINDEX", "TROPONINI" in the last 168 hours.  BNP: BNP (last 3 results) Recent Labs    09/04/21 0349 09/10/21 0039 09/17/21 0140  BNP 358.8* 521.3* 279.4*    ProBNP (last 3 results) No results for input(s): "PROBNP" in the last 8760 hours.   CBG: No results for input(s): "GLUCAP" in the last 168 hours.  Coagulation Studies: No results for input(s): "LABPROT", "INR" in the last 72 hours.  Other results:   Imaging    No results found.    Patient Profile:   [name] is a 51 y.o. female who has a history of morbid obesity, HTN, negative for sleep apnea, Medtroinc ICD, HFrEF, HMIII LVAD, and uterine mass.   Admitted for heparin bridge and robotic hysterectomy on Wed.     Assessment/Plan:    1. Ovarian Mass Mass -Admitted for scheduled robotic hysterectomy on Wed.  -Last dose of coumadin was Thursday. Had 3 doses of lovenox.  - Check INR  2. Chronic HFrEF, HMIII LVAD, Medtronic ICD NICM . Cause for cardiomyopathy is uncertain, initially identified peri-partum (after son's birth), so cannot rule out peri-partum CMP.  On and off, she has had frequent PVCs.   HMIII implant 08/2021. Ramp ECHO 08/2021.  - VAD interrogated. Stable parameters. Continue current speed.  -Volume status stable. Hold  toremide.  - Continue spironolactone 25 mg daily  -  Continue ASA 81 mg daily. J - Continue sildenafil 20 mg tid.  -Check LDH, INR, BMET  -Hold coumadin. Check INR INR < 1.8 start heparin drip.  - Placed ted hose.   3. Hyperthyroidism, suspected from amiodarone.  -In the community she is managed by Endocrinology -Continue methimazole 10 mg twice a day  -Continue prednisone 10 mg daily    4. PVCs,/ VT VT 2020-->  hypokalemia VF shock 2021-->hypokalemia VT 2022 --> quinidine added by EP.  -Previously on amio but stopped due to hyperthyroidism -On quinidine.  -Watch K/Mag closely and supplement as needed.   5. CKD Stage IIIIab Check BMET   6. PAF -In SR today   7. H/O CVA 2021  -Thrombectomy with stent placement.  No LV thrombus noted and no atrial fibrillation noted.      I reviewed the LVAD parameters from today, and compared the results to the patient's prior recorded data.  No programming changes were made.  The LVAD is functioning within specified parameters.  The patient performs LVAD self-test daily.  LVAD interrogation was negative for any significant power changes, alarms or PI events/speed drops.  LVAD equipment check completed and is in good working order.  Back-up equipment present.   LVAD education done on emergency procedures and precautions and reviewed exit site care.  Length of Stay: 0  Darrick Grinder, NP 02/03/2022, 11:03 AM  VAD Team Pager [phone] (7am - 7am) +++VAD ISSUES ONLY+++   Advanced Heart Failure Team Pager [phone] (M-F; [place])  Please contact [facility] for night-coverage after hours (5p -7a ) and weekends on [email or web address] for all non- LVAD Issues  Patient seen with NP, agree with the above note.   She has not had further heavy bleeding with Provera.  Hgb today is 11.1.  She has had some peripheral edema recently and has been taking torsemide 20 mg daily, but denies exertional dyspnea.   She is admitted today pre-op for total hysterectomy and BSO.   General: Well appearing this am. NAD.  HEENT: Normal. Neck: Supple, JVP 7-8 cm. Carotids OK.  Cardiac:  Mechanical heart sounds with LVAD hum present.  Lungs:  CTAB, normal effort.  Abdomen:  NT, ND, no HSM. No bruits or masses. +BS  LVAD exit site: Well-healed and incorporated. Dressing dry and intact. No erythema or drainage. Stabilization device present and accurately applied. Driveline dressing changed  daily per sterile technique. Extremities:  Warm and dry. No cyanosis, clubbing, rash. Trace ankle edema.  Neuro:  Alert & oriented x 3. Cranial nerves grossly intact. Moves all 4 extremities w/o difficulty. Affect pleasant     1. Chronic systolic HF: Nonischemic cardiomyopathy.  She has a Medtronic ICD. Cause for cardiomyopathy is uncertain, initially identified peri-partum (after son's birth), so cannot rule out peri-partum CMP.  On and off, she has had frequent PVCs.   RHC in 8/15 showed normal filling pressures and preserved cardiac output. Echo in 4/22 with EF 25%, severe LV dilation.  CPX (8/22) with mild-moderate HF limitation.  Echo in 3/23 with EF < 20% with severe dilation, normal RV size with mildly decreased systolic function, moderate-severe functional MR with dilated IVC. 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 Impella 5.5 placed 08/29/21. Patient had HM3 LVAD placed 09/04/21 and speed adjusted by ramp echo up to 5800 rpm. LVAD interrogation shows stable parameters today.  She has been doing  well lately, weight stable, MAP 70s.   - Continue torsemide 20 mg daily with KCl 20 daily.  - Continue spironolactone 25 mg daily.  - She should continue ASA 81 mg daily with history of CVA.  - Continue sildenafil 20 mg tid  - I will obtain echo while she is here.  - Warfarin for VAD, goal INR 2-2.5 => on hold now for surgery with INR 1.2, will cover with heparin gtt (has been on Lovenox). .   2. 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  She had been on Levoxyl at home prior to 3/23 admission, noted to be hyperthyroid in 3/23.  This likely contributes to her CHF. She had recurrent VT and poorly tolerated AF which made it difficult for [name] to stop amiodarone. Initially treated w/ IV Solumedrol  -> transitioned to  prednisone and methimazole.  Endocrinology following, prednisone being titrated down.  She is off amiodarone.  - Continue methimazole 10 mg bid - Continue prednisone 10 mg daily.   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  - Continue quinidine.  - Now off amiodarone.  4. CVA: CVA in 10/21, had thrombectomy with stent placement.  No LV thrombus noted and no atrial fibrillation noted.  - Would continue ASA 81 daily.   5. Anemia: Improved, hgb 11.1 today.  6. CKD stage 3: Stable creatinine at 1.4 today. Follow BMET.    7. Mitral regurgitation: Moderate-severe functional MR pre-LVAD.  Unlikely to be Mitraclip candidate with severely dilated LV (>7 cm). Hopefully will improve with LVAD.  - Will get echo while she is here.  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  Unable to get MRI with ICD. Seen by GYN. Felt to be most likely non-neoplastic. Tumor markers within normal range. Possible low grade malignancy, could also be benign.  No ascites noted on abdominal CT and no mention of abdominal spread.  She was seen by OB/gyn, best course thought to be surgical removal of the ovarian mass. Plan for total hysterectomy + BSO on Wednesday. INR 1.2.  - Start heparin gtt today (has been on Lovenox).  9. Atrial fibrillation: Tolerates poorly.   - Watch closely off amiodarone.  10. Heavy menses: In ER 7/23 with anemia in setting of very heavy menses requiring 2 units PRBCs.  Bleeding has resolved with Provera.  - Has had feraheme.  - As above, plan for total hysterectomy + BSO on Wednesday.   [name] 02/03/2022 1:43 PM

## 2022-02-03 NOTE — Plan of Care (Signed)
  Problem: Education: Goal: Knowledge of General Education information will improve Description: Including pain rating scale, medication(s)/side effects and non-pharmacologic comfort measures Outcome: Progressing   Problem: Health Behavior/Discharge Planning: Goal: Ability to manage health-related needs will improve Outcome: Progressing   Problem: Clinical Measurements: Goal: Ability to maintain clinical measurements within normal limits will improve Outcome: Progressing Goal: Will remain free from infection Outcome: Progressing Goal: Diagnostic test results will improve Outcome: Progressing Goal: Respiratory complications will improve Outcome: Progressing Goal: Cardiovascular complication will be avoided Outcome: Progressing   Problem: Activity: Goal: Risk for activity intolerance will decrease Outcome: Progressing   Problem: Nutrition: Goal: Adequate nutrition will be maintained Outcome: Progressing   Problem: Coping: Goal: Level of anxiety will decrease Outcome: Progressing   Problem: Elimination: Goal: Will not experience complications related to bowel motility Outcome: Progressing Goal: Will not experience complications related to urinary retention Outcome: Progressing   Problem: Safety: Goal: Ability to remain free from injury will improve Outcome: Progressing   Problem: Skin Integrity: Goal: Risk for impaired skin integrity will decrease Outcome: Progressing   Problem: Education: Goal: Knowledge of General Education information will improve Description: Including pain rating scale, medication(s)/side effects and non-pharmacologic comfort measures Outcome: Progressing   Problem: Health Behavior/Discharge Planning: Goal: Ability to manage health-related needs will improve Outcome: Progressing   Problem: Clinical Measurements: Goal: Ability to maintain clinical measurements within normal limits will improve Outcome: Progressing Goal: Will remain free  from infection Outcome: Progressing Goal: Diagnostic test results will improve Outcome: Progressing Goal: Respiratory complications will improve Outcome: Progressing Goal: Cardiovascular complication will be avoided Outcome: Progressing   Problem: Activity: Goal: Risk for activity intolerance will decrease Outcome: Progressing   Problem: Nutrition: Goal: Adequate nutrition will be maintained Outcome: Progressing   Problem: Coping: Goal: Level of anxiety will decrease Outcome: Progressing   Problem: Elimination: Goal: Will not experience complications related to bowel motility Outcome: Progressing Goal: Will not experience complications related to urinary retention Outcome: Progressing   Problem: Pain Managment: Goal: General experience of comfort will improve Outcome: Progressing   Problem: Safety: Goal: Ability to remain free from injury will improve Outcome: Progressing   Problem: Skin Integrity: Goal: Risk for impaired skin integrity will decrease Outcome: Progressing   Problem: Education: Goal: Patient will understand all VAD equipment and how it functions Outcome: Progressing Goal: Patient will be able to verbalize current INR target range and antiplatelet therapy for discharge home Outcome: Progressing   Problem: Cardiac: Goal: LVAD will function as expected and patient will experience no clinical alarms Outcome: Progressing

## 2022-02-03 NOTE — Progress Notes (Signed)
ANTICOAGULATION CONSULT NOTE - Initial Consult  Pharmacy Consult for heparin Indication:  LVAD  No Known Allergies  Patient Measurements: Height: '5\' 3"'$  (160 cm) Weight: 107.7 kg (237 lb 7 oz) IBW/kg (Calculated) : 52.4 Heparin Dosing Weight: 78kg  Vital Signs: Temp: 98.3 F (36.8 C) (08/14 1126) Temp Source: Oral (08/14 1126) BP: 89/62 (08/14 1126) Pulse Rate: 97 (08/14 1013)  Labs: No results for input(s): "HGB", "HCT", "PLT", "APTT", "LABPROT", "INR", "HEPARINUNFRC", "HEPRLOWMOCWT", "CREATININE", "CKTOTAL", "CKMB", "TROPONINIHS" in the last 72 hours.  CrCl cannot be calculated (Patient's most recent lab result is older than the maximum 21 days allowed.).   Medical History: Past Medical History:  Diagnosis Date   Automatic implantable cardioverter-defibrillator in situ    Chronic CHF (congestive heart failure) ([place])    a. EF 15-20% b. RHC (09/2013) RA 14, RV 57/22, PA 64/36 (48), PCWP 18, FIck CO/CI 3.7/1.6, PVR 8.1 WU, PA sat 47%    History of hypothyroidism    History of stomach ulcers    Hyperthyroidism    Hypotension    Morbid obesity ([place])    Myocardial infarction ([place]) 08/2013   Nocturnal dyspnea    Nonischemic cardiomyopathy ([place])    Sinus tachycardia    Sleep apnea    Snoring-prob OSA 09/04/2011   Sprint Fidelis ICD lead RECALL  6949    Stroke ([place]) 2021   some residual right-sided weakness   UARS (upper airway resistance syndrome) 09/04/2011   HST 12/2013:  AHI 4/hr (numerous episodes of airflow reduction that did not have concomitant desaturation)    Assessment: 51 year old female with LVAD implanted earlier this year. Patient being admitted for total hysterectomy on 8/16. Last dose of warfarin was 8/10, lovenox bridge was started with last dose given 8/13pm. Will start low dose heparin bridge today with INR of 1.2. Hemoglobin 11s, LDH low 200s. No overt bleeding issues noted.   Looking back at history patient was at the low end of therapeutic (0.2-0.3  units/ml) on 300 units/hr of heparin during previous stay. Will start at this lower rate and discuss with team whether titrations are needed.   Goal of Therapy:  Heparin level goal <0.3 Monitor platelets by anticoagulation protocol: Yes   Plan:  Start heparin infusion at 300 units/hr - was at low end of therapeutic at this rate earlier this year Check anti-Xa level in 6 hours and daily while on heparin Continue to monitor H&H and platelets  Erin Hearing PharmD., BCPS Clinical Pharmacist 02/03/2022 11:45 AM

## 2022-02-04 ENCOUNTER — Telehealth: Payer: Self-pay | Admitting: *Deleted

## 2022-02-04 ENCOUNTER — Encounter (HOSPITAL_COMMUNITY): Payer: BC Managed Care – PPO

## 2022-02-04 ENCOUNTER — Inpatient Hospital Stay (HOSPITAL_COMMUNITY): Payer: BC Managed Care – PPO

## 2022-02-04 DIAGNOSIS — Z95811 Presence of heart assist device: Secondary | ICD-10-CM | POA: Diagnosis not present

## 2022-02-04 DIAGNOSIS — G4733 Obstructive sleep apnea (adult) (pediatric): Secondary | ICD-10-CM

## 2022-02-04 DIAGNOSIS — I5022 Chronic systolic (congestive) heart failure: Secondary | ICD-10-CM

## 2022-02-04 DIAGNOSIS — N9489 Other specified conditions associated with female genital organs and menstrual cycle: Secondary | ICD-10-CM | POA: Diagnosis not present

## 2022-02-04 LAB — BASIC METABOLIC PANEL
Anion gap: 6 (ref 5–15)
BUN: 16 mg/dL (ref 6–20)
CO2: 24 mmol/L (ref 22–32)
Calcium: 8.9 mg/dL (ref 8.9–10.3)
Chloride: 108 mmol/L (ref 98–111)
Creatinine, Ser: 1.41 mg/dL — ABNORMAL HIGH (ref 0.44–1.00)
GFR, Estimated: 45 mL/min — ABNORMAL LOW (ref >60)
Glucose, Bld: 97 mg/dL (ref 70–99)
Potassium: 4.1 mmol/L (ref 3.5–5.1)
Sodium: 138 mmol/L (ref 135–145)

## 2022-02-04 LAB — ECHOCARDIOGRAM COMPLETE
Height: 63 in
S' Lateral: 5.9 cm
Weight: 3767.22 oz

## 2022-02-04 LAB — COMPREHENSIVE METABOLIC PANEL
ALT: 12 U/L (ref 0–44)
AST: 17 U/L (ref 15–41)
Albumin: 3.5 g/dL (ref 3.5–5.0)
Alkaline Phosphatase: 56 U/L (ref 38–126)
Anion gap: 7 (ref 5–15)
BUN: 16 mg/dL (ref 6–20)
CO2: 23 mmol/L (ref 22–32)
Calcium: 9 mg/dL (ref 8.9–10.3)
Chloride: 108 mmol/L (ref 98–111)
Creatinine, Ser: 1.25 mg/dL — ABNORMAL HIGH (ref 0.44–1.00)
GFR, Estimated: 53 mL/min — ABNORMAL LOW (ref >60)
Glucose, Bld: 103 mg/dL — ABNORMAL HIGH (ref 70–99)
Potassium: 4.2 mmol/L (ref 3.5–5.1)
Sodium: 138 mmol/L (ref 135–145)
Total Bilirubin: 0.5 mg/dL (ref 0.3–1.2)
Total Protein: 6.1 g/dL — ABNORMAL LOW (ref 6.5–8.1)

## 2022-02-04 LAB — TYPE AND SCREEN
ABO/RH(D): B POS
Antibody Screen: NEGATIVE

## 2022-02-04 LAB — PROTIME-INR
INR: 1.2 (ref 0.8–1.2)
Prothrombin Time: 15.2 seconds (ref 11.4–15.2)

## 2022-02-04 LAB — CBC
HCT: 32.9 % — ABNORMAL LOW (ref 36.0–46.0)
HCT: 34 % — ABNORMAL LOW (ref 36.0–46.0)
Hemoglobin: 10.4 g/dL — ABNORMAL LOW (ref 12.0–15.0)
Hemoglobin: 10.7 g/dL — ABNORMAL LOW (ref 12.0–15.0)
MCH: 26.5 pg (ref 26.0–34.0)
MCH: 26.4 pg (ref 26.0–34.0)
MCHC: 31.6 g/dL (ref 30.0–36.0)
MCHC: 31.5 g/dL (ref 30.0–36.0)
MCV: 83.9 fL (ref 80.0–100.0)
MCV: 83.7 fL (ref 80.0–100.0)
Platelets: 386 10*3/uL (ref 150–400)
Platelets: 253 10*3/uL (ref 150–400)
RBC: 3.92 MIL/uL (ref 3.87–5.11)
RBC: 4.06 MIL/uL (ref 3.87–5.11)
RDW: 15.8 % — ABNORMAL HIGH (ref 11.5–15.5)
RDW: 15.5 % (ref 11.5–15.5)
WBC: 6.5 10*3/uL (ref 4.0–10.5)
WBC: 7.1 10*3/uL (ref 4.0–10.5)
nRBC: 0 % (ref 0.0–0.2)
nRBC: 0 % (ref 0.0–0.2)

## 2022-02-04 LAB — HEPARIN LEVEL (UNFRACTIONATED): Heparin Unfractionated: <0.1 IU/mL — ABNORMAL LOW (ref 0.30–0.70)

## 2022-02-04 LAB — MAGNESIUM: Magnesium: 1.9 mg/dL (ref 1.7–2.4)

## 2022-02-04 LAB — LACTATE DEHYDROGENASE: LDH: 197 U/L — ABNORMAL HIGH (ref 98–192)

## 2022-02-04 MED ORDER — CHLORHEXIDINE GLUCONATE CLOTH 2 % EX PADS
6.0000 | MEDICATED_PAD | Freq: Once | CUTANEOUS | Status: AC
  Administered 2022-02-05: 6 via TOPICAL

## 2022-02-04 MED ORDER — CHLORHEXIDINE GLUCONATE CLOTH 2 % EX PADS
6.0000 | MEDICATED_PAD | Freq: Once | CUTANEOUS | Status: AC
  Administered 2022-02-04: 6 via TOPICAL

## 2022-02-04 MED ORDER — SCOPOLAMINE 1 MG/3DAYS TD PT72
1.0000 | MEDICATED_PATCH | TRANSDERMAL | Status: DC
  Filled 2022-02-04 (×2): qty 1

## 2022-02-04 MED ORDER — ACETAMINOPHEN 500 MG PO TABS
1000.0000 mg | ORAL_TABLET | ORAL | Status: AC
  Administered 2022-02-05: 1000 mg via ORAL
  Filled 2022-02-04: qty 2

## 2022-02-04 MED ORDER — MAGNESIUM SULFATE 2 GM/50ML IV SOLN
2.0000 g | Freq: Once | INTRAVENOUS | Status: AC
  Administered 2022-02-04: 2 g via INTRAVENOUS
  Filled 2022-02-04: qty 50

## 2022-02-04 MED ORDER — DEXAMETHASONE SODIUM PHOSPHATE 4 MG/ML IJ SOLN
4.0000 mg | INTRAMUSCULAR | Status: DC
  Filled 2022-02-04: qty 1

## 2022-02-04 NOTE — Progress Notes (Signed)
ANTICOAGULATION CONSULT NOTE - Follow Up Consult  Pharmacy Consult for heparin Indication:  LVAD  No Known Allergies  Patient Measurements: Height: '5\' 3"'$  (160 cm) Weight: 106.8 kg (235 lb 7.2 oz) IBW/kg (Calculated) : 52.4 Heparin Dosing Weight: 78kg  Vital Signs: Temp: 97.8 F (36.6 C) (08/15 0600) Temp Source: Oral (08/15 0600) BP: 114/72 (08/15 0600) Pulse Rate: 87 (08/15 0600)  Labs: Recent Labs    02/03/22 1049 02/03/22 1948 02/04/22 0029  HGB 11.1*  --  10.4*  HCT 35.3*  --  32.9*  PLT 280  --  386  LABPROT 14.9  --  15.2  INR 1.2  --  1.2  HEPARINUNFRC  --  <0.10* <0.10*  CREATININE 1.42*  --  1.41*     Estimated Creatinine Clearance: 55.9 mL/min (A) (by C-G formula based on SCr of 1.41 mg/dL (H)).   Medical History: Past Medical History:  Diagnosis Date   Automatic implantable cardioverter-defibrillator in situ    Chronic CHF (congestive heart failure) ([place])    a. EF 15-20% b. RHC (09/2013) RA 14, RV 57/22, PA 64/36 (48), PCWP 18, FIck CO/CI 3.7/1.6, PVR 8.1 WU, PA sat 47%    History of hypothyroidism    History of stomach ulcers    Hyperthyroidism    Hypotension    Morbid obesity ([place])    Myocardial infarction ([place]) 08/2013   Nocturnal dyspnea    Nonischemic cardiomyopathy ([place])    Sinus tachycardia    Sleep apnea    Snoring-prob OSA 09/04/2011   Sprint Fidelis ICD lead RECALL  6949    Stroke ([place]) 2021   some residual right-sided weakness   UARS (upper airway resistance syndrome) 09/04/2011   HST 12/2013:  AHI 4/hr (numerous episodes of airflow reduction that did not have concomitant desaturation)    Assessment: 51 year old female with LVAD implanted earlier this year. Patient being admitted for total hysterectomy on 8/16. Last dose of warfarin was 8/10, lovenox bridge was started with last dose given 8/13pm. Will start low dose heparin bridge today with INR of 1.2. Hemoglobin 10-11s, LDH 190s. No overt bleeding issues noted.   Heparin drip 300  uts/he Heparin level < 0.1.    Goal of Therapy:  Heparin level goal <0.3 Monitor platelets by anticoagulation protocol: Yes   Plan:  Increase heparin infusion to 400 units/hr  Daily heparin level and CBC  Monitor s/s bleeding   Erin Hearing PharmD., BCPS Clinical Pharmacist 02/04/2022 7:19 AM

## 2022-02-04 NOTE — Progress Notes (Addendum)
LVAD Coordinator Rounding Note:  Planned admission 02/03/22 to [facility] for heparin bridge per Dr [name]. Pt posted for total hysterectomy.  Pt walking around her room. States she has had some spotting over the past 24 hrs but no significant vaginal bleeding.   Pt is asking if she can shower prior to surgery tomorrow. Per Dr [name] pt may shower today off cardiac monitor. Order placed. Pt given shower bag and instructions on how to use the shower bag.   Pt posted for total hysterectomy tomorrow. VAD coordinators will accompany pt to OR.   Vital signs: Temp: 97.9 HR: 87 Doppler Pressure: 90 Auto BP:  99/76 (84) O2 Sat: 92% on RA  Wt:  237.44>235.4 lbs  LVAD interrogation:  Speed: 5800 Flow: 5.2 Power: 4.6w PI: 2.7 Hct: 20-DO NOT ADJUST  Alarms: none Events: 5-10 daily   Fixed speed: 5800 Low speed limit: 5500  Drive Line:  Existing VAD dressing removed and site care performed using sterile technique. Drive line exit site cleaned with Chlora prep applicators x 2, allowed to dry, and Sorbaview dressing with Silverlon patch applied. Exit site healed and incorporated, the velour is fully implanted at exit site. No redness, tenderness, drainage, foul odor or rash noted. Drive line anchor re-applied. 2 large tegaderms applied to site. Next dressing change due: 02/11/22.        Labs:  LDH trend:  207>197  INR trend:  1.2  Hgb: 11.1>10.4   Anticoagulation Plan: -INR Goal: 2.0 - 2.5-holding for surgery on Wednesday -ASA Dose: 81 mg  -Heparin gtt 400 u/hr  Device: Medtronic single ICD - Therapies: on 200 bpm - Pacing:  VVI 40 -  Last check 09/18/21 (therapy turned on after surgery/transfer to [facility])          Plan/Recommendations:  1. Page VAD coordinators with VAD equipment and drive line concerns. 2. Weekly dressing change per VAD coordinator or caregiver. Next dressing change due 02/10/22. 3. VAD coordinator will accompany pt to OR on Wednesday. 4. Pt ok to shower  Tanda Rockers RN [place] Coordinator  Office: [phone]  24/7 Pager: [phone]

## 2022-02-04 NOTE — Telephone Encounter (Signed)
-----   Message from Lauralee Evener, [place] sent at 01/21/2022 10:20 AM EDT -----  ----- Message ----- From: Sueanne Margarita, MD Sent: 12/31/2021   8:57 AM EDT To: [facility] Sleep Studies  Nondiagnostic study due to inadequate sleep time - please repeat PSG with sleep aide - take Lunesta '3mg'$  (Rx #1 tablet with 0 refills) take at sleep lab

## 2022-02-04 NOTE — Discharge Instructions (Signed)
AFTER SURGERY INSTRUCTIONS   Return to work: 4-6 weeks if applicable   Activity: 1. Be up and out of the bed during the day.  Take a nap if needed.  You may walk up steps but be careful and use the hand rail.  Stair climbing will tire you more than you think, you may need to stop part way and rest.    2. No lifting or straining for 6 weeks over 10 pounds. No pushing, pulling, straining for 6 weeks.   3. No driving for around 1 week(s) if you were cleared to drive before surgery.  Do not drive if you are taking narcotic pain medicine and make sure that your reaction time has returned.    4. You can shower as soon as the next day after surgery. Shower daily.  Use your regular soap and water (not directly on the incision) and pat your incision(s) dry afterwards; don't rub.  No tub baths or submerging your body in water until cleared by your surgeon. If you have the soap that was given to you by pre-surgical testing that was used before surgery, you do not need to use it afterwards because this can irritate your incisions.    5. No sexual activity and nothing in the vagina for 8 weeks.   6. You may experience a small amount of clear drainage from your incisions, which is normal.  If the drainage persists, increases, or changes color please call the office.   7. Do not use creams, lotions, or ointments such as neosporin on your incisions after surgery until advised by your surgeon because they can cause removal of the dermabond glue on your incisions.     8. You may experience vaginal spotting after surgery or around the 6-8 week mark from surgery when the stitches at the top of the vagina begin to dissolve.  The spotting is normal but if you experience heavy bleeding, call our office.   9. Take Tylenol or ibuprofen first for pain if you are able to take these medications and only use narcotic pain medication for severe pain not relieved by the Tylenol or Ibuprofen.  Monitor your Tylenol intake to a  max of 4,000 mg in a 24 hour period. You can alternate these medications after surgery.   Diet: 1. Low sodium Heart Healthy Diet is recommended but you are cleared to resume your normal (before surgery) diet after your procedure.   2. It is safe to use a laxative, such as Miralax or Colace, if you have difficulty moving your bowels. You have been prescribed Sennakot-S to take at bedtime every evening after surgery to keep bowel movements regular and to prevent constipation.     Wound Care: 1. Keep clean and dry.  Shower daily.   Reasons to call the Doctor: Fever - Oral temperature greater than 100.4 degrees Fahrenheit Foul-smelling vaginal discharge Difficulty urinating Nausea and vomiting Increased pain at the site of the incision that is unrelieved with pain medicine. Difficulty breathing with or without chest pain New calf pain especially if only on one side Sudden, continuing increased vaginal bleeding with or without clots.   Contacts: For questions or concerns you should contact:   Dr. Jeral Pinch at [phone]   Joylene John, NP at [phone]   After Hours: call [phone] and have the GYN Oncologist paged/contacted (after 5 pm or on the weekends).   Messages sent via mychart are for non-urgent matters and are not responded to after hours so for  urgent needs, please call the after hours number.

## 2022-02-04 NOTE — Progress Notes (Signed)
Gynecologic Oncology Progress Note  [name] is a 51 year old female scheduled for robotic assisted total laparoscopic hysterectomy, bilateral salpingo-oophorectomy, possible staging if a cancer is identified, possible laparotomy on February 05, 2022 for an adnexal mass. She is currently admitted as of 02/03/22 under management of Cardiology for heparin bridge pre-operatively. History includes morbid obesity, HTN, negative for sleep apnea, Medtroinc ICD, HFrEF, HMIII LVAD, and uterine mass/bleeding.  The patient reports doing well. States she is ready to get surgery completed. Denies pain. Tolerating diet with no nausea or emesis. All questions answered. No needs voiced at end of visit. Husband at the bedside. Continue plan of care per Cards team. Recommendation per [name] for discontinuation of heparin drip at least 6 hours before surgery.

## 2022-02-04 NOTE — Telephone Encounter (Signed)
Prior Authorization for NPSG sent to [facility] via web portal. Tracking Number Order ID: [id number] Authorized  Approval Valid Through:02/04/2022 - 04/04/2022.

## 2022-02-04 NOTE — Progress Notes (Addendum)
Advanced Heart Failure VAD Team Note  PCP-Cardiologist: [name], MD   Subjective:    No events overnight. No complaints. Surgery scheduled tomorrow.   On heparin gtt. INR 1.2   Hgb 11.1>>10.4    LVAD INTERROGATION:  HeartMate III LVAD:   Flow 5.1 liters/min, speed 5850, power 4.5, PI 2.5.  7 PI events   Objective:    Vital Signs:   Temp:  [97.8 F (36.6 C)-98.3 F (36.8 C)] 97.8 F (36.6 C) (08/15 0600) Pulse Rate:  [80-97] 87 (08/15 0600) Resp:  [16-18] 18 (08/15 0600) BP: (60-114)/(49-81) 114/72 (08/15 0600) SpO2:  [92 %-94 %] 94 % (08/15 0040) Weight:  [106.8 kg-107.7 kg] 106.8 kg (08/15 0600) Last BM Date : 02/03/22 Mean arterial Pressure 84  Intake/Output:   Intake/Output Summary (Last 24 hours) at 02/04/2022 0727 Last data filed at 02/04/2022 0333 Gross per 24 hour  Intake 283.62 ml  Output --  Net 283.62 ml     Physical Exam    General:  Well appearing. No resp difficulty HEENT: normal Neck: supple. JVP 8-9 cm . Carotids 2+ bilat; no bruits. No lymphadenopathy or thyromegaly appreciated. Cor: Mechanical heart sounds with LVAD hum present. Lungs: clear Abdomen: soft, nontender, nondistended. No hepatosplenomegaly. No bruits or masses. Good bowel sounds. Driveline: C/D/I; securement device intact and driveline incorporated Extremities: no cyanosis, clubbing, rash, edema Neuro: alert & orientedx3, cranial nerves grossly intact. moves all 4 extremities w/o difficulty. Affect pleasant   Telemetry   NSR 70s   EKG    N/A   Labs   Basic Metabolic Panel: Recent Labs  Lab 02/03/22 1049 02/04/22 0029  NA 140 138  K 4.2 4.1  CL 108 108  CO2 25 24  GLUCOSE 109* 97  BUN 14 16  CREATININE 1.42* 1.41*  CALCIUM 9.0 8.9  MG  --  1.9    Liver Function Tests: Recent Labs  Lab 02/03/22 1049  AST 17  ALT 13  ALKPHOS 61  BILITOT 0.5  PROT 6.5  ALBUMIN 3.7   No results for input(s): "LIPASE", "AMYLASE" in the last 168 hours. No  results for input(s): "AMMONIA" in the last 168 hours.  CBC: Recent Labs  Lab 02/03/22 1049 02/04/22 0029  WBC 5.0 6.5  NEUTROABS 3.4  --   HGB 11.1* 10.4*  HCT 35.3* 32.9*  MCV 84.0 83.9  PLT 280 386    INR: Recent Labs  Lab 01/30/22 0000 02/03/22 1049 02/04/22 0029  INR 3.0 1.2 1.2    Other results: EKG:    Imaging   No results found.   Medications:     Scheduled Medications:  aspirin EC  81 mg Oral Daily   methimazole  10 mg Oral BID   potassium chloride  20 mEq Oral Daily   predniSONE  10 mg Oral Q breakfast   quiniDINE gluconate  324 mg Oral BID   rosuvastatin  40 mg Oral QHS   senna-docusate  2 tablet Oral QHS   sildenafil  20 mg Oral TID   sodium chloride flush  3 mL Intravenous Q12H   spironolactone  25 mg Oral Daily   torsemide  20 mg Oral Daily   zinc sulfate  220 mg Oral Daily    Infusions:  sodium chloride     heparin 300 Units/hr (02/04/22 0333)   magnesium sulfate bolus IVPB      PRN Medications: sodium chloride, acetaminophen, ondansetron (ZOFRAN) IV, sodium chloride flush   Patient Profile   [name]  is a 51 y.o. female who has a history of morbid obesity, HTN, negative for sleep apnea, Medtroinc ICD, HFrEF, HMIII LVAD, and uterine mass.    Admitted for heparin bridge and robotic hysterectomy   Assessment/Plan:     1. Chronic systolic HF: Nonischemic cardiomyopathy.  She has a Medtronic ICD. Cause for cardiomyopathy is uncertain, initially identified peri-partum (after son's birth), so cannot rule out peri-partum CMP.  On and off, she has had frequent PVCs.   RHC in 8/15 showed normal filling pressures and preserved cardiac output. Echo in 4/22 with EF 25%, severe LV dilation.  CPX (8/22) with mild-moderate HF limitation.  Echo in 3/23 with EF < 20% with severe dilation, normal RV size with mildly decreased systolic function, moderate-severe functional MR with dilated IVC. She was admitted in 3/23 with low output HF, RHC on 0.25  of Milrinone showed elevated filling pressures, moderate mixed pulmonary venous/pulmonary arterial hypertension and low output. Impella 5.5 placed 08/29/21. Patient had HM3 LVAD placed 09/04/21 and speed adjusted by ramp echo up to 5800 rpm. LVAD interrogation shows stable parameters today.  She has been doing well lately, weight stable, MAP 70-80s.   - Continue torsemide 20 mg daily with KCl 20 daily.  - Continue spironolactone 25 mg daily.  - She should continue ASA 81 mg daily with history of CVA.  - Continue sildenafil 20 mg tid  - I will obtain echo while she is here.  - Warfarin for VAD, goal INR 2-2.5 => on hold now for surgery with INR 1.2, will cover with heparin gtt (has been on Lovenox).   2. 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  She had been on Levoxyl at home prior to 3/23 admission, noted to be hyperthyroid in 3/23.  This likely contributes to her CHF. She had recurrent VT and poorly tolerated AF which made it difficult for [name] to stop amiodarone. Initially treated w/ IV Solumedrol  -> transitioned to prednisone and methimazole.  Endocrinology following, prednisone being titrated down.  She is off amiodarone.  - Continue methimazole 10 mg bid - Continue prednisone 10 mg daily.   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  - Continue quinidine.  - Now off amiodarone.  4. CVA: CVA in 10/21, had thrombectomy with stent placement.  No LV thrombus noted and no atrial fibrillation noted.  - Would continue ASA 81 daily.   5. Anemia: Improved, hgb 10.4 today.  6. CKD stage 3: Stable creatinine at 1.41 today. Follow BMET.    7. Mitral regurgitation: Moderate-severe functional MR pre-LVAD.  Unlikely to be Mitraclip candidate with severely dilated LV (>7 cm). Hopefully will improve with LVAD.  - Will get echo while she is here.  8. Left ovarian mass: Cystic left ovarian mass noted on CT abdomen/pelvis, pelvic US with complex  cystic left ovarian mass.  Unable to get MRI with ICD. Seen by GYN. Felt to be most likely non-neoplastic. Tumor markers within normal range. Possible low grade malignancy, could also be benign.  No ascites noted on abdominal CT and no mention of abdominal spread.  She was seen by OB/gyn, best course thought to be surgical removal of the ovarian mass. Plan for total hysterectomy + BSO tomorrow. INR 1.2.  - continue heparin gtt today (has been on Lovenox).  9. Atrial fibrillation: Tolerates poorly.   - Watch closely off amiodarone.  10. Heavy menses: In ER 7/23 with anemia in setting of very heavy menses requiring 2  units PRBCs.  Bleeding has resolved with Provera.  - Has had feraheme.  - As above, plan for total hysterectomy + BSO tomorrow     I reviewed the LVAD parameters from today, and compared the results to the patient's prior recorded data.  No programming changes were made.  The LVAD is functioning within specified parameters.  The patient performs LVAD self-test daily.  LVAD interrogation was negative for any significant power changes, alarms or PI events/speed drops.  LVAD equipment check completed and is in good working order.  Back-up equipment present.   LVAD education done on emergency procedures and precautions and reviewed exit site care.  Length of Stay: 1  Nelida Gores 02/04/2022, 7:27 AM  VAD Team --- VAD ISSUES ONLY--- Pager [phone] (7am - 7am)  Advanced Heart Failure Team  Pager [phone] (M-F; 7a - 5p)  Please contact [facility] for night-coverage after hours (5p -7a ) and weekends on [email or web address]  Patient seen with PA, agree with the above note.   Stable today, INR 1.2.  No complaints.   Echo reviewed, septal wall bows mildly towards the RV and there is no significant mitral regurgitation. IVC is small.   General: Well appearing this am. NAD.  HEENT: Normal. Neck: Supple, JVP 7-8 cm. Carotids OK.  Cardiac:  Mechanical heart sounds with LVAD hum  present.  Lungs:  CTAB, normal effort.  Abdomen:  NT, ND, no HSM. No bruits or masses. +BS  LVAD exit site: Well-healed and incorporated. Dressing dry and intact. No erythema or drainage. Stabilization device present and accurately applied. Driveline dressing changed daily per sterile technique. Extremities:  Warm and dry. No cyanosis, clubbing, rash, or edema.  Neuro:  Alert & oriented x 3. Cranial nerves grossly intact. Moves all 4 extremities w/o difficulty. Affect pleasant    Plan for hysterectomy/BSO tomorrow, INR 1.2 and on heparin gtt.  NPO at MN.   I am going to increase LVAD speed to 5900 based on echo.   No other changes.   [name] 02/04/2022 1:49 PM

## 2022-02-04 NOTE — Progress Notes (Signed)
Echocardiogram 2D Echocardiogram has been performed.  Oneal Deputy Soyla Bainter RDCS 02/04/2022, 9:35 AM

## 2022-02-04 NOTE — Telephone Encounter (Signed)
The patient has been notified of the result and verbalized understanding.  All questions (if any) were answered. Marolyn Hammock, CMA 5/32/0233 4:35 PM    PRECERT NPSG

## 2022-02-05 ENCOUNTER — Telehealth: Payer: BC Managed Care – PPO | Admitting: Gynecologic Oncology

## 2022-02-05 ENCOUNTER — Encounter (HOSPITAL_COMMUNITY)
Admission: RE | Disposition: A | Discharge status: Home / Self Care | Payer: Self-pay | Source: Home / Self Care | Attending: Cardiology

## 2022-02-05 ENCOUNTER — Inpatient Hospital Stay (HOSPITAL_COMMUNITY): Payer: BC Managed Care – PPO

## 2022-02-05 ENCOUNTER — Inpatient Hospital Stay (HOSPITAL_COMMUNITY): Payer: BC Managed Care – PPO | Admitting: Certified Registered Nurse Anesthetist

## 2022-02-05 ENCOUNTER — Encounter (HOSPITAL_COMMUNITY): Payer: Self-pay | Admitting: Cardiology

## 2022-02-05 ENCOUNTER — Other Ambulatory Visit: Payer: Self-pay

## 2022-02-05 DIAGNOSIS — N939 Abnormal uterine and vaginal bleeding, unspecified: Secondary | ICD-10-CM

## 2022-02-05 DIAGNOSIS — N9489 Other specified conditions associated with female genital organs and menstrual cycle: Secondary | ICD-10-CM

## 2022-02-05 DIAGNOSIS — I5022 Chronic systolic (congestive) heart failure: Secondary | ICD-10-CM | POA: Diagnosis not present

## 2022-02-05 DIAGNOSIS — Z95811 Presence of heart assist device: Secondary | ICD-10-CM | POA: Diagnosis not present

## 2022-02-05 HISTORY — PX: ROBOTIC ASSISTED TOTAL HYSTERECTOMY WITH BILATERAL SALPINGO OOPHERECTOMY: SHX6086

## 2022-02-05 LAB — POCT I-STAT 7, (LYTES, BLD GAS, ICA,H+H)
Acid-base deficit: 1 mmol/L (ref 0.0–2.0)
Bicarbonate: 25.5 mmol/L (ref 20.0–28.0)
Calcium, Ion: 1.23 mmol/L (ref 1.15–1.40)
HCT: 32 % — ABNORMAL LOW (ref 36.0–46.0)
Hemoglobin: 10.9 g/dL — ABNORMAL LOW (ref 12.0–15.0)
O2 Saturation: 100 %
Potassium: 3.9 mmol/L (ref 3.5–5.1)
Sodium: 138 mmol/L (ref 135–145)
TCO2: 27 mmol/L (ref 22–32)
pCO2 arterial: 49 mmHg — ABNORMAL HIGH (ref 32–48)
pH, Arterial: 7.325 — ABNORMAL LOW (ref 7.35–7.45)
pO2, Arterial: 432 mmHg — ABNORMAL HIGH (ref 83–108)

## 2022-02-05 LAB — BASIC METABOLIC PANEL
Anion gap: 6 (ref 5–15)
BUN: 15 mg/dL (ref 6–20)
CO2: 25 mmol/L (ref 22–32)
Calcium: 8.8 mg/dL — ABNORMAL LOW (ref 8.9–10.3)
Chloride: 107 mmol/L (ref 98–111)
Creatinine, Ser: 1.22 mg/dL — ABNORMAL HIGH (ref 0.44–1.00)
GFR, Estimated: 54 mL/min — ABNORMAL LOW (ref >60)
Glucose, Bld: 96 mg/dL (ref 70–99)
Potassium: 4.1 mmol/L (ref 3.5–5.1)
Sodium: 138 mmol/L (ref 135–145)

## 2022-02-05 LAB — CBC
HCT: 32.6 % — ABNORMAL LOW (ref 36.0–46.0)
Hemoglobin: 10.2 g/dL — ABNORMAL LOW (ref 12.0–15.0)
MCH: 26.5 pg (ref 26.0–34.0)
MCHC: 31.3 g/dL (ref 30.0–36.0)
MCV: 84.7 fL (ref 80.0–100.0)
Platelets: 227 10*3/uL (ref 150–400)
RBC: 3.85 MIL/uL — ABNORMAL LOW (ref 3.87–5.11)
RDW: 15.7 % — ABNORMAL HIGH (ref 11.5–15.5)
WBC: 7.1 10*3/uL (ref 4.0–10.5)
nRBC: 0 % (ref 0.0–0.2)

## 2022-02-05 LAB — LACTATE DEHYDROGENASE: LDH: 182 U/L (ref 98–192)

## 2022-02-05 LAB — SURGICAL PCR SCREEN
MRSA, PCR: NEGATIVE
Staphylococcus aureus: NEGATIVE

## 2022-02-05 LAB — PROTIME-INR
INR: 1.1 (ref 0.8–1.2)
Prothrombin Time: 14.3 seconds (ref 11.4–15.2)

## 2022-02-05 LAB — PREGNANCY, URINE: Preg Test, Ur: NEGATIVE

## 2022-02-05 LAB — ECHO INTRAOPERATIVE TEE: Weight: 3777.8 oz

## 2022-02-05 LAB — HEPARIN LEVEL (UNFRACTIONATED): Heparin Unfractionated: <0.1 IU/mL — ABNORMAL LOW (ref 0.30–0.70)

## 2022-02-05 SURGERY — HYSTERECTOMY, TOTAL, ROBOT-ASSISTED, LAPAROSCOPIC, WITH BILATERAL SALPINGO-OOPHORECTOMY
Anesthesia: General

## 2022-02-05 MED ORDER — MILRINONE LACTATE IN DEXTROSE 20-5 MG/100ML-% IV SOLN
INTRAVENOUS | Status: DC | PRN
  Administered 2022-02-05: .375 ug/kg/min via INTRAVENOUS

## 2022-02-05 MED ORDER — SUGAMMADEX SODIUM 500 MG/5ML IV SOLN
INTRAVENOUS | Status: AC
  Filled 2022-02-05: qty 5

## 2022-02-05 MED ORDER — FENTANYL CITRATE (PF) 250 MCG/5ML IJ SOLN
INTRAMUSCULAR | Status: DC | PRN
Start: 2022-02-05 — End: 2022-02-05
  Administered 2022-02-05: 100 ug via INTRAVENOUS
  Administered 2022-02-05: 50 ug via INTRAVENOUS
  Administered 2022-02-05: 25 ug via INTRAVENOUS
  Administered 2022-02-05: 50 ug via INTRAVENOUS
  Administered 2022-02-05: 25 ug via INTRAVENOUS

## 2022-02-05 MED ORDER — NOREPINEPHRINE 4 MG/250ML-% IV SOLN
INTRAVENOUS | Status: DC | PRN
  Administered 2022-02-05: 2 ug/min via INTRAVENOUS

## 2022-02-05 MED ORDER — FENTANYL CITRATE (PF) 250 MCG/5ML IJ SOLN
INTRAMUSCULAR | Status: AC
  Filled 2022-02-05: qty 5

## 2022-02-05 MED ORDER — MIDAZOLAM HCL 2 MG/2ML IJ SOLN
INTRAMUSCULAR | Status: AC
  Filled 2022-02-05: qty 2

## 2022-02-05 MED ORDER — CHLORHEXIDINE GLUCONATE CLOTH 2 % EX PADS
6.0000 | MEDICATED_PAD | Freq: Every day | CUTANEOUS | Status: DC
Start: 2022-02-05 — End: 2022-02-08
  Administered 2022-02-05 – 2022-02-08 (×4): 6 via TOPICAL

## 2022-02-05 MED ORDER — PROPOFOL 10 MG/ML IV BOLUS
INTRAVENOUS | Status: DC | PRN
  Administered 2022-02-05: 60 mg via INTRAVENOUS
  Administered 2022-02-05: 40 mg via INTRAVENOUS

## 2022-02-05 MED ORDER — FENTANYL CITRATE (PF) 100 MCG/2ML IJ SOLN
INTRAMUSCULAR | Status: AC
  Filled 2022-02-05: qty 2

## 2022-02-05 MED ORDER — CHLORHEXIDINE GLUCONATE 0.12 % MT SOLN
OROMUCOSAL | Status: AC
  Filled 2022-02-05: qty 15

## 2022-02-05 MED ORDER — LIDOCAINE 2% (20 MG/ML) 5 ML SYRINGE
INTRAMUSCULAR | Status: DC | PRN
  Administered 2022-02-05: 80 mg via INTRAVENOUS

## 2022-02-05 MED ORDER — DEXAMETHASONE SODIUM PHOSPHATE 10 MG/ML IJ SOLN
INTRAMUSCULAR | Status: DC | PRN
  Administered 2022-02-05: 10 mg via INTRAVENOUS

## 2022-02-05 MED ORDER — EPHEDRINE 5 MG/ML INJ
INTRAVENOUS | Status: AC
  Filled 2022-02-05: qty 5

## 2022-02-05 MED ORDER — HEMOSTATIC AGENTS (NO CHARGE) OPTIME
TOPICAL | Status: DC | PRN
  Administered 2022-02-05: 1 via TOPICAL

## 2022-02-05 MED ORDER — PHENYLEPHRINE 80 MCG/ML (10ML) SYRINGE FOR IV PUSH (FOR BLOOD PRESSURE SUPPORT)
PREFILLED_SYRINGE | INTRAVENOUS | Status: AC
  Filled 2022-02-05: qty 10

## 2022-02-05 MED ORDER — PROPOFOL 10 MG/ML IV BOLUS
INTRAVENOUS | Status: AC
  Filled 2022-02-05: qty 20

## 2022-02-05 MED ORDER — STERILE WATER FOR IRRIGATION IR SOLN
Status: DC | PRN
  Administered 2022-02-05: 1000 mL

## 2022-02-05 MED ORDER — ROCURONIUM BROMIDE 10 MG/ML (PF) SYRINGE
PREFILLED_SYRINGE | INTRAVENOUS | Status: AC
  Filled 2022-02-05: qty 20

## 2022-02-05 MED ORDER — DEXAMETHASONE SODIUM PHOSPHATE 10 MG/ML IJ SOLN
INTRAMUSCULAR | Status: AC
  Filled 2022-02-05: qty 1

## 2022-02-05 MED ORDER — MIDAZOLAM HCL 2 MG/2ML IJ SOLN
INTRAMUSCULAR | Status: DC | PRN
  Administered 2022-02-05 (×2): 1 mg via INTRAVENOUS

## 2022-02-05 MED ORDER — ONDANSETRON HCL 4 MG/2ML IJ SOLN
4.0000 mg | Freq: Four times a day (QID) | INTRAMUSCULAR | Status: DC | PRN
  Administered 2022-02-07: 4 mg via INTRAVENOUS
  Filled 2022-02-05: qty 2

## 2022-02-05 MED ORDER — LACTATED RINGERS IR SOLN
Status: DC | PRN
  Administered 2022-02-05: 1000 mL

## 2022-02-05 MED ORDER — SUGAMMADEX SODIUM 200 MG/2ML IV SOLN
INTRAVENOUS | Status: DC | PRN
  Administered 2022-02-05: 300 mg via INTRAVENOUS

## 2022-02-05 MED ORDER — LACTATED RINGERS IV SOLN: INTRAVENOUS | Status: DC

## 2022-02-05 MED ORDER — ONDANSETRON HCL 4 MG/2ML IJ SOLN
INTRAMUSCULAR | Status: AC
  Filled 2022-02-05: qty 2

## 2022-02-05 MED ORDER — BUPIVACAINE-EPINEPHRINE (PF) 0.25% -1:200000 IJ SOLN
INTRAMUSCULAR | Status: DC | PRN
  Administered 2022-02-05: 30 mL

## 2022-02-05 MED ORDER — PHENYLEPHRINE 80 MCG/ML (10ML) SYRINGE FOR IV PUSH (FOR BLOOD PRESSURE SUPPORT)
PREFILLED_SYRINGE | INTRAVENOUS | Status: DC | PRN
  Administered 2022-02-05 (×3): 80 ug via INTRAVENOUS

## 2022-02-05 MED ORDER — PROPOFOL 500 MG/50ML IV EMUL
INTRAVENOUS | Status: DC | PRN
  Administered 2022-02-05: 100 ug/kg/min via INTRAVENOUS

## 2022-02-05 MED ORDER — ETOMIDATE 2 MG/ML IV SOLN
INTRAVENOUS | Status: AC
  Filled 2022-02-05: qty 10

## 2022-02-05 MED ORDER — ROCURONIUM BROMIDE 10 MG/ML (PF) SYRINGE
PREFILLED_SYRINGE | INTRAVENOUS | Status: DC | PRN
  Administered 2022-02-05 (×2): 10 mg via INTRAVENOUS
  Administered 2022-02-05: 20 mg via INTRAVENOUS
  Administered 2022-02-05: 100 mg via INTRAVENOUS

## 2022-02-05 MED ORDER — ALBUMIN HUMAN 5 % IV SOLN: INTRAVENOUS | Status: DC | PRN

## 2022-02-05 MED ORDER — ESZOPICLONE 3 MG PO TABS: 3.0000 mg | ORAL_TABLET | Freq: Every day | ORAL | 0 refills | Status: DC

## 2022-02-05 MED ORDER — HEPARIN (PORCINE) 25000 UT/250ML-% IV SOLN
400.0000 [IU]/h | INTRAVENOUS | Status: DC
  Administered 2022-02-05 – 2022-02-08 (×2): 400 [IU]/h via INTRAVENOUS
  Filled 2022-02-05 (×3): qty 250

## 2022-02-05 MED ORDER — LIDOCAINE 2% (20 MG/ML) 5 ML SYRINGE
INTRAMUSCULAR | Status: AC
  Filled 2022-02-05: qty 5

## 2022-02-05 MED ORDER — ONDANSETRON HCL 4 MG/2ML IJ SOLN
INTRAMUSCULAR | Status: DC | PRN
  Administered 2022-02-05: 4 mg via INTRAVENOUS

## 2022-02-05 MED ORDER — HYDROMORPHONE HCL 1 MG/ML IJ SOLN: 0.2000 mg | INTRAMUSCULAR | Status: DC | PRN

## 2022-02-05 MED ORDER — CEFAZOLIN SODIUM-DEXTROSE 2-4 GM/100ML-% IV SOLN
2.0000 g | INTRAVENOUS | Status: AC
  Administered 2022-02-05: 2 g via INTRAVENOUS
  Filled 2022-02-05: qty 100

## 2022-02-05 MED ORDER — EPHEDRINE SULFATE-NACL 50-0.9 MG/10ML-% IV SOSY
PREFILLED_SYRINGE | INTRAVENOUS | Status: DC | PRN
  Administered 2022-02-05: 2.5 mg via INTRAVENOUS

## 2022-02-05 MED ORDER — BUPIVACAINE HCL (PF) 0.25 % IJ SOLN
INTRAMUSCULAR | Status: AC
  Filled 2022-02-05: qty 30

## 2022-02-05 MED ORDER — LACTATED RINGERS IV SOLN: INTRAVENOUS | Status: DC | PRN

## 2022-02-05 MED ORDER — ONDANSETRON HCL 4 MG PO TABS: 4.0000 mg | ORAL_TABLET | Freq: Four times a day (QID) | ORAL | Status: DC | PRN

## 2022-02-05 MED ORDER — OXYCODONE HCL 5 MG PO TABS
5.0000 mg | ORAL_TABLET | ORAL | Status: DC | PRN
  Administered 2022-02-05 (×2): 10 mg via ORAL
  Administered 2022-02-06 (×2): 5 mg via ORAL
  Administered 2022-02-06: 10 mg via ORAL
  Filled 2022-02-05: qty 1
  Filled 2022-02-05 (×3): qty 2
  Filled 2022-02-05: qty 1

## 2022-02-05 SURGICAL SUPPLY — 84 items
ADH SKN CLS APL DERMABOND .7 (GAUZE/BANDAGES/DRESSINGS) ×1
AGENT HMST KT MTR STRL THRMB (HEMOSTASIS)
AGENT HMST MTR 8 SURGIFLO (HEMOSTASIS) ×1
APL ESCP 34 STRL LF DISP (HEMOSTASIS) ×1
APL SRG 38 LTWT LNG FL B (MISCELLANEOUS) ×1
APPLICATOR ARISTA FLEXITIP XL (MISCELLANEOUS) ×1 IMPLANT
APPLICATOR SURGIFLO ENDO (HEMOSTASIS) ×1 IMPLANT
BAG LAPAROSCOPIC 12 15 PORT 16 (BASKET) IMPLANT
BAG RETRIEVAL 12/15 (BASKET) ×4
BLADE SURG SZ10 CARB STEEL (BLADE) IMPLANT
COVER BACK TABLE 60X90IN (DRAPES) ×2 IMPLANT
COVER TIP SHEARS 8 DVNC (MISCELLANEOUS) ×1 IMPLANT
COVER TIP SHEARS 8MM DA VINCI (MISCELLANEOUS) ×2
DERMABOND ADVANCED (GAUZE/BANDAGES/DRESSINGS) ×2
DERMABOND ADVANCED .7 DNX12 (GAUZE/BANDAGES/DRESSINGS) ×1 IMPLANT
DRAPE ARM DVNC X/XI (DISPOSABLE) ×4 IMPLANT
DRAPE COLUMN DVNC XI (DISPOSABLE) ×1 IMPLANT
DRAPE DA VINCI XI ARM (DISPOSABLE) ×8
DRAPE DA VINCI XI COLUMN (DISPOSABLE) ×2
DRAPE SHEET LG 3/4 BI-LAMINATE (DRAPES) ×2 IMPLANT
DRAPE SURG IRRIG POUCH 19X23 (DRAPES) ×2 IMPLANT
DRSG OPSITE POSTOP 4X6 (GAUZE/BANDAGES/DRESSINGS) IMPLANT
DRSG OPSITE POSTOP 4X8 (GAUZE/BANDAGES/DRESSINGS) IMPLANT
ELECT PENCIL ROCKER SW 15FT (MISCELLANEOUS) ×1 IMPLANT
ELECT REM PT RETURN 15FT ADLT (MISCELLANEOUS) ×2 IMPLANT
GAUZE 4X4 16PLY ~~LOC~~+RFID DBL (SPONGE) ×4 IMPLANT
GLOVE BIO SURGEON STRL SZ 6 (GLOVE) ×8 IMPLANT
GLOVE BIO SURGEON STRL SZ 6.5 (GLOVE) ×2 IMPLANT
GOWN STRL REUS W/ TWL LRG LVL3 (GOWN DISPOSABLE) ×4 IMPLANT
GOWN STRL REUS W/TWL LRG LVL3 (GOWN DISPOSABLE) ×8
HEMOSTAT ARISTA ABSORB 3G PWDR (HEMOSTASIS) ×1 IMPLANT
HOLDER FOLEY CATH W/STRAP (MISCELLANEOUS) IMPLANT
IRRIG SUCT STRYKERFLOW 2 WTIP (MISCELLANEOUS) ×2
IRRIGATION SUCT STRKRFLW 2 WTP (MISCELLANEOUS) ×1 IMPLANT
KIT PROCEDURE DA VINCI SI (MISCELLANEOUS)
KIT PROCEDURE DVNC SI (MISCELLANEOUS) IMPLANT
KIT TURNOVER KIT A (KITS) IMPLANT
LIGASURE IMPACT 36 18CM CVD LR (INSTRUMENTS) IMPLANT
MANIPULATOR ADVINCU DEL 3.0 PL (MISCELLANEOUS) IMPLANT
MANIPULATOR ADVINCU DEL 3.5 PL (MISCELLANEOUS) IMPLANT
MANIPULATOR ADVINCU DEL 4.0 PL (MISCELLANEOUS) ×1 IMPLANT
MANIPULATOR UTERINE 4.5 ZUMI (MISCELLANEOUS) IMPLANT
NDL HYPO 21X1.5 SAFETY (NEEDLE) ×1 IMPLANT
NDL SPNL 18GX3.5 QUINCKE PK (NEEDLE) IMPLANT
NEEDLE HYPO 21X1.5 SAFETY (NEEDLE) ×2 IMPLANT
NEEDLE SPNL 18GX3.5 QUINCKE PK (NEEDLE) IMPLANT
OBTURATOR OPTICAL STANDARD 8MM (TROCAR) ×2
OBTURATOR OPTICAL STND 8 DVNC (TROCAR) ×1
OBTURATOR OPTICALSTD 8 DVNC (TROCAR) ×1 IMPLANT
PACK ROBOT GYN CUSTOM WL (TRAY / TRAY PROCEDURE) ×2 IMPLANT
PAD POSITIONING PINK XL (MISCELLANEOUS) ×2 IMPLANT
PORT ACCESS TROCAR AIRSEAL 12 (TROCAR) ×1 IMPLANT
PORT ACCESS TROCAR AIRSEAL 5M (TROCAR) ×2
SCRUB CHG 4% DYNA-HEX 4OZ (MISCELLANEOUS) ×4 IMPLANT
SEAL CANN UNIV 5-8 DVNC XI (MISCELLANEOUS) ×4 IMPLANT
SEAL XI 5MM-8MM UNIVERSAL (MISCELLANEOUS) ×8
SET TRI-LUMEN FLTR TB AIRSEAL (TUBING) ×2 IMPLANT
SPIKE FLUID TRANSFER (MISCELLANEOUS) ×2 IMPLANT
SPOGE SURGIFLO 8M (HEMOSTASIS) ×2
SPONGE SURGIFLO 8M (HEMOSTASIS) IMPLANT
SPONGE T-LAP 18X18 ~~LOC~~+RFID (SPONGE) IMPLANT
SURGIFLO W/THROMBIN 8M KIT (HEMOSTASIS) IMPLANT
SUT MNCRL AB 4-0 PS2 18 (SUTURE) IMPLANT
SUT PDS AB 1 TP1 96 (SUTURE) IMPLANT
SUT VIC AB 0 CT1 27 (SUTURE)
SUT VIC AB 0 CT1 27XBRD ANTBC (SUTURE) IMPLANT
SUT VIC AB 2-0 CT1 27 (SUTURE)
SUT VIC AB 2-0 CT1 TAPERPNT 27 (SUTURE) IMPLANT
SUT VIC AB 2-0 SH 27 (SUTURE) ×2
SUT VIC AB 2-0 SH 27XBRD (SUTURE) IMPLANT
SUT VIC AB 4-0 PS2 18 (SUTURE) ×4 IMPLANT
SUT VLOC 180 0 9IN  GS21 (SUTURE) ×2
SUT VLOC 180 0 9IN GS21 (SUTURE) IMPLANT
SYR 10ML LL (SYRINGE) IMPLANT
SYS BAG RETRIEVAL 10MM (BASKET)
SYS WOUND ALEXIS 18CM MED (MISCELLANEOUS)
SYSTEM BAG RETRIEVAL 10MM (BASKET) IMPLANT
SYSTEM WOUND ALEXIS 18CM MED (MISCELLANEOUS) IMPLANT
TOWEL OR NON WOVEN STRL DISP B (DISPOSABLE) IMPLANT
TRAP SPECIMEN MUCUS 40CC (MISCELLANEOUS) IMPLANT
TRAY FOLEY MTR SLVR 16FR STAT (SET/KITS/TRAYS/PACK) ×2 IMPLANT
UNDERPAD 30X36 HEAVY ABSORB (UNDERPADS AND DIAPERS) ×4 IMPLANT
WATER STERILE IRR 1000ML POUR (IV SOLUTION) ×2 IMPLANT
YANKAUER SUCT BULB TIP 10FT TU (MISCELLANEOUS) IMPLANT

## 2022-02-05 NOTE — Progress Notes (Signed)
LVAD Coordinator Rounding Note:  Planned admission 02/03/22 to [facility] for heparin bridge per Dr [name]. Pt posted for total hysterectomy.  Patient met in short stay for a robotic hysterectomy with [name]. Patient is alert and oriented with no complaints this AM. Myself and [name] accompanied the patient to the OR as her LVAD Coordinators. No complications in the OR. Patient returned to 2C17. Low dose heparin to started 2300 tonight per Dr Berline Lopes and Dr Darcey Nora. Pharmacy made aware.   Vital signs: Temp: 97.9 HR: 88 Doppler Pressure: 75 Auto BP:  107/85 O2 Sat: 100% on RA  Wt:  237.4>235.4>236lbs  LVAD interrogation:  Speed: 5900 Flow: 5.3 Power: 4.8w PI: 2.6 Hct: 20-DO NOT ADJUST  Alarms: none Events: 5-10 daily   Fixed speed: 5900 Low speed limit: 5600  Drive Line: CDI. Drive line anchor secure. Weekly dressing changes by bedside nurse. Next dressing change due: 02/10/22.    Labs:  LDH trend:  207>182  INR trend:  1.2>1.1  Hgb: 11.1>10.2   Anticoagulation Plan: -INR Goal: 2.0 - 2.5-holding for surgery on Wednesday -ASA Dose: 81 mg  -Heparin gtt 400 u/hr- OFF at 0427 for surgery today 02/05/22 -Restart low dose Heparin tonight at 2300 per pharmacy  Device: Medtronic single ICD - Therapies: on 200 bpm - Pacing:  VVI 40 -  Last check 09/18/21 (therapy turned on after surgery/transfer to [facility])          Plan/Recommendations:  1. Page VAD coordinators with VAD equipment and drive line concerns. 2. Weekly dressing change per VAD coordinator or caregiver. Next dressing change due 02/11/22.   Miranda Morton RN [place] Coordinator  Office: [phone]  24/7 Pager: [phone]

## 2022-02-05 NOTE — Progress Notes (Signed)
Transported back from PACU  by bed awake and alert.

## 2022-02-05 NOTE — Interval H&P Note (Signed)
History and Physical Interval Note:  02/05/2022 10:26 AM  [name]  has presented today for surgery, with the diagnosis of [place].  The various methods of treatment have been discussed with the patient and family. After consideration of risks, benefits and other options for treatment, the patient has consented to  Procedure(s): XI ROBOTIC ASSISTED TOTAL HYSTERECTOMY WITH BILATERAL SALPINGO OOPHORECTOMY (N/A) POSSIBLE LAPAROTOMY WITH POSSIBLE STAGING (N/A) as a surgical intervention.  The patient's history has been reviewed, patient examined, no change in status, stable for surgery.  I have reviewed the patient's chart and labs.  Questions were answered to the patient's satisfaction.     Lafonda Mosses

## 2022-02-05 NOTE — Anesthesia Procedure Notes (Signed)
Central Venous Catheter Insertion Performed by: Oleta Mouse, MD, anesthesiologist Start/End8/16/2023 10:49 AM, 02/05/2022 10:59 AM Patient location: Pre-op. Preanesthetic checklist: patient identified, IV checked, site marked, risks and benefits discussed, surgical consent, monitors and equipment checked, pre-op evaluation, timeout performed and anesthesia consent Position: supine Lidocaine 1% used for infiltration and patient sedated Hand hygiene performed  and maximum sterile barriers used  Catheter size: 8 Fr Total catheter length 16. Central line was placed.Double lumen Procedure performed using ultrasound guided technique. Ultrasound Notes:anatomy identified, needle tip was noted to be adjacent to the nerve/plexus identified, no ultrasound evidence of intravascular and/or intraneural injection and image(s) printed for medical record Attempts: 1 Following insertion, dressing applied, line sutured and Biopatch. Post procedure assessment: blood return through all ports, free fluid flow and no air  Patient tolerated the procedure well with no immediate complications.

## 2022-02-05 NOTE — Telephone Encounter (Addendum)
Lunesta '3mg'$  (Rx #1 tablet with 0 refills) take at sleep lab Called into patients preferred pharmacy

## 2022-02-05 NOTE — Progress Notes (Signed)
Exit site care: Existing VAD dressing removed and site care performed using sterile technique. Drive line exit site cleaned with Chlora prep applicators x 2, allowed to dry, and Sorbaview dressing with Silverlon patch applied. Exit site healed and incorporated, the velour is fully implanted at exit site. No redness, tenderness, drainage, foul odor or rash noted. Drive line anchor re-applied. Next dressing change due: 02/12/22 per bedside RN or pt's husband.   [name] RN [place] Coordinator  Office: [phone]  24/7 Pager: [phone]

## 2022-02-05 NOTE — Progress Notes (Addendum)
Advanced Heart Failure VAD Team Note  PCP-Cardiologist: [name], MD   Subjective:    Notes a little bit of spotting but no significant vaginal bleeding.   Hgb stable 10.2.   MAPs primarily 80s  VAD speed increased to 5900 after echo yesterday.  LVAD INTERROGATION:  HeartMate III LVAD:   Flow 5.3 liters/min, speed 5900, power 5, PI 2.3.  18 PI events. VAD interrogated personally  Objective:    Vital Signs:   Temp:  [97.8 F (36.6 C)-98.4 F (36.9 C)] 97.8 F (36.6 C) (08/16 0430) Pulse Rate:  [68-69] 68 (08/16 0733) Resp:  [15-20] 15 (08/16 0430) BP: (77-109)/(59-96) 78/61 (08/16 0430) SpO2:  [96 %-99 %] 96 % (08/16 0733) Weight:  [107.1 kg] 107.1 kg (08/16 0500) Last BM Date : 02/03/01 Mean arterial Pressure 80s  Intake/Output:   Intake/Output Summary (Last 24 hours) at 02/05/2022 0758 Last data filed at 02/05/2022 0300 Gross per 24 hour  Intake 86.74 ml  Output --  Net 86.74 ml     Physical Exam    Physical Exam: GENERAL: Well appearing. Sitting up in bed. HEENT: normal  NECK: Supple, JVP not elevated.  2+ bilaterally, no bruits.   CARDIAC:  Mechanical heart sounds with LVAD hum present.  LUNGS:  Clear to auscultation bilaterally.  ABDOMEN:  Soft, round, nontender, positive bowel sounds x4.     LVAD exit site: well-healed and incorporated.  Dressing dry and intact.  No erythema or drainage.  Stabilization device present and accurately applied.   EXTREMITIES:  Warm and dry, no cyanosis, clubbing, rash or edema  NEUROLOGIC:  Alert and oriented x 4.  Gait steady.  No aphasia.  No dysarthria.  Affect pleasant.       Telemetry   SR 70s, 1-2 PVCs/min  EKG    N/A   Labs   Basic Metabolic Panel: Recent Labs  Lab 02/03/22 1049 02/04/22 0029 02/04/22 2117 02/05/22 0030  NA 140 138 138 138  K 4.2 4.1 4.2 4.1  CL 108 108 108 107  CO2 '25 24 23 25  '$ GLUCOSE 109* 97 103* 96  BUN '14 16 16 15  '$ CREATININE 1.42* 1.41* 1.25* 1.22*  CALCIUM 9.0 8.9  9.0 8.8*  MG  --  1.9  --   --     Liver Function Tests: Recent Labs  Lab 02/03/22 1049 02/04/22 2117  AST 17 17  ALT 13 12  ALKPHOS 61 56  BILITOT 0.5 0.5  PROT 6.5 6.1*  ALBUMIN 3.7 3.5   No results for input(s): "LIPASE", "AMYLASE" in the last 168 hours. No results for input(s): "AMMONIA" in the last 168 hours.  CBC: Recent Labs  Lab 02/03/22 1049 02/04/22 0029 02/04/22 2117 02/05/22 0030  WBC 5.0 6.5 7.1 7.1  NEUTROABS 3.4  --   --   --   HGB 11.1* 10.4* 10.7* 10.2*  HCT 35.3* 32.9* 34.0* 32.6*  MCV 84.0 83.9 83.7 84.7  PLT 280 386 253 227    INR: Recent Labs  Lab 01/30/22 0000 02/03/22 1049 02/04/22 0029 02/05/22 0030  INR 3.0 1.2 1.2 1.1    Other results: EKG:    Imaging   ECHOCARDIOGRAM COMPLETE  Result Date: 02/04/2022    ECHOCARDIOGRAM REPORT   Patient Name:   [name] Date of Exam: 02/04/2022 Medical Rec #:  [id number]    Height:       63.0 in Accession #:    [id number]   Weight:       235.4  lb Date of Birth:  July 30, 1970    BSA:          2.073 m Patient Age:    51 years     BP:           0/0 mmHg Patient Gender: F            HR:           90 bpm. Exam Location:  Inpatient Procedure: 2D Echo, Color Doppler and Cardiac Doppler Indications:    M75.44 Chronic systolic (congestive) heart failure  History:        Patient has prior history of Echocardiogram examinations, most                 recent 09/18/2021. CHF; Defibrillator and Pacemaker. HM III Speed                 5800.  Sonographer:    Raquel Sarna Senior RDCS Referring Phys: [place]  1. HM3 LVAD 5800 rpm. EF <20%. LV septum bows into the RV. No MR on color doppler interrogation. AoV appears closed with trivial AI. Trivial TR. Poor quality study. Left ventricular ejection fraction, by estimation, is <20%. The left ventricle has severely decreased function. The left ventricle demonstrates global hypokinesis. The left ventricular internal cavity size was mildly to moderately dilated.  Left ventricular diastolic function could not be evaluated.  2. Right ventricular systolic function was not well visualized. The right ventricular size is not well visualized. Tricuspid regurgitation signal is inadequate for assessing PA pressure.  3. The mitral valve was not well visualized. No evidence of mitral valve regurgitation.  4. The aortic valve was not well visualized. Aortic valve regurgitation is trivial. No aortic stenosis is present.  5. The inferior vena cava is normal in size with greater than 50% respiratory variability, suggesting right atrial pressure of 3 mmHg. Comparison(s): No significant change from prior study. FINDINGS  Left Ventricle: HM3 LVAD 5800 rpm. EF <20%. LV septum bows into the RV. No MR on color doppler interrogation. AoV appears closed with trivial AI. Trivial TR. Poor quality study. Left ventricular ejection fraction, by estimation, is <20%. The left ventricle has severely decreased function. The left ventricle demonstrates global hypokinesis. The left ventricular internal cavity size was mildly to moderately dilated. There is no left ventricular hypertrophy. Left ventricular diastolic function could  not be evaluated due to nondiagnostic images. Left ventricular diastolic function could not be evaluated. Right Ventricle: The right ventricular size is not well visualized. Right vetricular wall thickness was not well visualized. Right ventricular systolic function was not well visualized. Tricuspid regurgitation signal is inadequate for assessing PA pressure. Left Atrium: Left atrial size was not well visualized. Right Atrium: Right atrial size was not well visualized. Pericardium: There is no evidence of pericardial effusion. Mitral Valve: The mitral valve was not well visualized. No evidence of mitral valve regurgitation. Tricuspid Valve: The tricuspid valve is not well visualized. Tricuspid valve regurgitation is not demonstrated. Aortic Valve: The aortic valve was not well  visualized. Aortic valve regurgitation is trivial. No aortic stenosis is present. Pulmonic Valve: The pulmonic valve was grossly normal. Pulmonic valve regurgitation is trivial. No evidence of pulmonic stenosis. Aorta: The aortic root and ascending aorta are structurally normal, with no evidence of dilitation. Venous: The inferior vena cava is normal in size with greater than 50% respiratory variability, suggesting right atrial pressure of 3 mmHg. IAS/Shunts: The interatrial septum was not well visualized.  LEFT VENTRICLE  PLAX 2D LVIDd:         6.00 cm   Diastology LVIDs:         5.90 cm   LV e' medial:  5.00 cm/s LV PW:         1.00 cm   LV e' lateral: 6.31 cm/s LV IVS:        0.90 cm LVOT diam:     2.20 cm LVOT Area:     3.80 cm  RIGHT VENTRICLE RV S prime:     8.49 cm/s TAPSE (M-mode): 1.6 cm LEFT ATRIUM             Index LA diam:        5.50 cm 2.65 cm/m LA Vol (A2C):   80.1 ml 38.65 ml/m LA Vol (A4C):   89.3 ml 43.09 ml/m LA Biplane Vol: 89.1 ml 42.99 ml/m   AORTA Ao Root diam: 3.20 cm Ao Asc diam:  3.30 cm  SHUNTS Systemic Diam: 2.20 cm [name] Chiquito MD Electronically signed by [name] Chiquito MD Signature Date/Time: 02/04/2022/9:45:05 AM    Final      Medications:     Scheduled Medications:  acetaminophen  1,000 mg Oral On Call to OR   aspirin EC  81 mg Oral Daily   dexamethasone (DECADRON) injection  4 mg Intravenous On Call to OR   methimazole  10 mg Oral BID   potassium chloride  20 mEq Oral Daily   predniSONE  10 mg Oral Q breakfast   quiniDINE gluconate  324 mg Oral BID   rosuvastatin  40 mg Oral QHS   scopolamine  1 patch Transdermal On Call to OR   senna-docusate  2 tablet Oral QHS   sildenafil  20 mg Oral TID   sodium chloride flush  3 mL Intravenous Q12H   spironolactone  25 mg Oral Daily   torsemide  20 mg Oral Daily   zinc sulfate  220 mg Oral Daily    Infusions:  sodium chloride      ceFAZolin (ANCEF) IV      PRN Medications: sodium chloride, acetaminophen,  ondansetron (ZOFRAN) IV, sodium chloride flush   Patient Profile   [name] is a 51 y.o. female who has a history of morbid obesity, HTN, negative for sleep apnea, [employer] ICD, HFrEF, HMIII LVAD, and uterine mass.    Admitted for heparin bridge and robotic hysterectomy   Assessment/Plan:     1. Chronic systolic HF: Nonischemic cardiomyopathy.  She has a [employer] ICD. Cause for cardiomyopathy is uncertain, initially identified peri-partum (after son's birth), so cannot rule out peri-partum CMP.  On and off, she has had frequent PVCs.   RHC in 8/15 showed normal filling pressures and preserved cardiac output. Echo in 4/22 with EF 25%, severe LV dilation.  CPX (8/22) with mild-moderate HF limitation.  Echo in 3/23 with EF < 20% with severe dilation, normal RV size with mildly decreased systolic function, moderate-severe functional MR with dilated IVC. 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 Impella 5.5 placed 08/29/21. Patient had HM3 LVAD placed 09/04/21 and speed adjusted by ramp echo up to 5800 rpm. Echo 02/04/22: septal wall bows mildly towards the RV and no significant MR. IVC small. LVAD speed increased to 5900.  - Continue torsemide 20 mg daily with KCl 20 daily.  - Continue spironolactone 25 mg daily.  - She should  continue ASA 81 mg daily with history of CVA.  - Continue sildenafil 20 mg tid  - Warfarin for VAD, goal INR 2-2.5 => on hold now for surgery with INR 1.1, covered with heparin gtt (has been on Lovenox).  Heparin gtt off at 4:30 am (6 hrs pre-op) per GYN Oncology. 2. 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  She had been on Levoxyl at home prior to 3/23 admission, noted to be hyperthyroid in 3/23.  This likely contributes to her CHF. She had recurrent VT and poorly tolerated AF which made it  difficult for [name] to stop amiodarone. Initially treated w/ IV Solumedrol  -> transitioned to prednisone and methimazole.  Endocrinology following, prednisone being titrated down.  She is off amiodarone.  - Continue methimazole 10 mg bid - Continue prednisone 10 mg daily.   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  - Continue quinidine.  - Now off amiodarone.  4. CVA: CVA in 10/21, had thrombectomy with stent placement.  No LV thrombus noted and no atrial fibrillation noted.  - Would continue ASA 81 daily.   5. Anemia: Improved, hgb 10.2 today.  6. CKD stage 3: Stable creatinine at 1.22 today. Follow BMET.    7. Mitral regurgitation: Moderate-severe functional MR pre-LVAD.  Unlikely to be Mitraclip candidate with severely dilated LV (>7 cm). Hopefully will improve with LVAD.  - No significant MR on echo this admit 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  Unable to get MRI with ICD. Seen by GYN. Felt to be most likely non-neoplastic. Tumor markers within normal range. Possible low grade malignancy, could also be benign.  No ascites noted on abdominal CT and no mention of abdominal spread.  She was seen by OB/gyn, best course thought to be surgical removal of the ovarian mass. Plan for total hysterectomy + BSO today. INR 1.1.  - heparin gtt off 6 hrs pre-op. Resume as soon as possible post-op 9. Atrial fibrillation: Tolerates poorly.   - Watch closely off amiodarone.  - SR currently 10. Heavy menses: In ER 7/23 with anemia in setting of very heavy menses requiring 2 units PRBCs.  Bleeding has resolved with Provera.  - Has had feraheme.  - As above, plan for total hysterectomy + BSO today    I reviewed the LVAD parameters from today, and compared the results to the patient's prior recorded data.  No programming changes were made.  The LVAD is functioning within specified parameters.  The patient  performs LVAD self-test daily.  LVAD interrogation was negative for any significant power changes, alarms or PI events/speed drops.  LVAD equipment check completed and is in good working order.  Back-up equipment present.   LVAD education done on emergency procedures and precautions and reviewed exit site care.  Length of Stay: 2  FINCH, Lynder Parents, PA-C 02/05/2022, 7:58 AM  VAD Team --- VAD ISSUES ONLY--- Pager [phone] (7am - 7am)  Advanced Heart Failure Team  Pager [phone] (M-F; 7a - 5p)  Please contact [facility] for night-coverage after hours (5p -7a ) and weekends on [email or web address]  Agree with the above PA note.  No complaints this morning, creatinine lower at 1.2.  Tolerating higher speed 5900 rpm.   General: Well appearing this am. NAD.  HEENT: Normal. Neck: Supple, JVP 7-8 cm. Carotids OK.  Cardiac:  Mechanical heart sounds with LVAD hum present.  Lungs:  CTAB, normal effort.  Abdomen:  NT, ND, no HSM. No bruits or masses. +BS  LVAD exit site: Well-healed and incorporated. Dressing dry and intact. No erythema or drainage. Stabilization device present and accurately applied. Driveline dressing changed daily per sterile technique. Extremities:  Warm and dry. No cyanosis, clubbing, rash, or edema.  Neuro:  Alert & oriented x 3. Cranial nerves grossly intact. Moves all 4 extremities w/o difficulty. Affect pleasant    Patient going to OR today for hysterectomy/BSO.  Heparin on hold.  Restart when stable post-op and restart warfarin as well when ok with surgeon.   [name] 02/05/2022

## 2022-02-05 NOTE — Plan of Care (Signed)
  Problem: Education: Goal: Knowledge of General Education information will improve Description: Including pain rating scale, medication(s)/side effects and non-pharmacologic comfort measures Outcome: Progressing   Problem: Health Behavior/Discharge Planning: Goal: Ability to manage health-related needs will improve Outcome: Progressing   Problem: Clinical Measurements: Goal: Ability to maintain clinical measurements within normal limits will improve Outcome: Progressing Goal: Will remain free from infection Outcome: Progressing Goal: Diagnostic test results will improve Outcome: Progressing Goal: Respiratory complications will improve Outcome: Progressing Goal: Cardiovascular complication will be avoided Outcome: Progressing   Problem: Activity: Goal: Risk for activity intolerance will decrease Outcome: Progressing   Problem: Nutrition: Goal: Adequate nutrition will be maintained Outcome: Progressing   Problem: Coping: Goal: Level of anxiety will decrease Outcome: Progressing   Problem: Elimination: Goal: Will not experience complications related to bowel motility Outcome: Progressing Goal: Will not experience complications related to urinary retention Outcome: Progressing   Problem: Pain Managment: Goal: General experience of comfort will improve Outcome: Progressing   Problem: Safety: Goal: Ability to remain free from injury will improve Outcome: Progressing   Problem: Skin Integrity: Goal: Risk for impaired skin integrity will decrease Outcome: Progressing   Problem: Education: Goal: Knowledge of General Education information will improve Description: Including pain rating scale, medication(s)/side effects and non-pharmacologic comfort measures Outcome: Progressing   Problem: Health Behavior/Discharge Planning: Goal: Ability to manage health-related needs will improve Outcome: Progressing   Problem: Clinical Measurements: Goal: Ability to maintain  clinical measurements within normal limits will improve Outcome: Progressing Goal: Will remain free from infection Outcome: Progressing Goal: Diagnostic test results will improve Outcome: Progressing Goal: Respiratory complications will improve Outcome: Progressing Goal: Cardiovascular complication will be avoided Outcome: Progressing   Problem: Activity: Goal: Risk for activity intolerance will decrease Outcome: Progressing   Problem: Nutrition: Goal: Adequate nutrition will be maintained Outcome: Progressing   Problem: Coping: Goal: Level of anxiety will decrease Outcome: Progressing   Problem: Elimination: Goal: Will not experience complications related to bowel motility Outcome: Progressing Goal: Will not experience complications related to urinary retention Outcome: Progressing   Problem: Pain Managment: Goal: General experience of comfort will improve Outcome: Progressing   Problem: Safety: Goal: Ability to remain free from injury will improve Outcome: Progressing   Problem: Skin Integrity: Goal: Risk for impaired skin integrity will decrease Outcome: Progressing   Problem: Education: Goal: Patient will understand all VAD equipment and how it functions Outcome: Progressing Goal: Patient will be able to verbalize current INR target range and antiplatelet therapy for discharge home Outcome: Progressing

## 2022-02-05 NOTE — Anesthesia Procedure Notes (Signed)
Procedure Name: Intubation Date/Time: 02/05/2022 11:56 AM  Performed by: Dorthea Cove, CRNAPre-anesthesia Checklist: Patient identified, Emergency Drugs available, Suction available and Patient being monitored Patient Re-evaluated:Patient Re-evaluated prior to induction Oxygen Delivery Method: Circle system utilized Preoxygenation: Pre-oxygenation with 100% oxygen Induction Type: IV induction Ventilation: Mask ventilation without difficulty Laryngoscope Size: Mac and 3 Grade View: Grade I Tube type: Oral Tube size: 7.5 mm Number of attempts: 1 Airway Equipment and Method: Stylet and Oral airway Placement Confirmation: ETT inserted through vocal cords under direct vision, positive ETCO2 and breath sounds checked- equal and bilateral Secured at: 22 cm Tube secured with: Tape Dental Injury: Teeth and Oropharynx as per pre-operative assessment

## 2022-02-05 NOTE — Addendum Note (Signed)
Addended by: Freada Bergeron on: 02/05/2022 08:44 AM   Modules accepted: Orders

## 2022-02-05 NOTE — Progress Notes (Signed)
ANTICOAGULATION CONSULT NOTE - Follow Up Consult  Pharmacy Consult for heparin Indication:  LVAD  No Known Allergies  Patient Measurements: Height: '5\' 3"'$  (160 cm) Weight: 107.1 kg (236 lb 1.8 oz) IBW/kg (Calculated) : 52.4 Heparin Dosing Weight: 78kg  Vital Signs: Temp: 97.9 F (36.6 C) (08/16 1031) Temp Source: Oral (08/16 1031) BP: 107/85 (08/16 1031) Pulse Rate: 113 (08/16 1031)  Labs: Recent Labs    02/03/22 1049 02/03/22 1948 02/04/22 0029 02/04/22 2117 02/05/22 0030  HGB 11.1*  --  10.4* 10.7* 10.2*  HCT 35.3*  --  32.9* 34.0* 32.6*  PLT 280  --  386 253 227  LABPROT 14.9  --  15.2  --  14.3  INR 1.2  --  1.2  --  1.1  HEPARINUNFRC  --  <0.10* <0.10*  --  <0.10*  CREATININE 1.42*  --  1.41* 1.25* 1.22*     Estimated Creatinine Clearance: 64.7 mL/min (A) (by C-G formula based on SCr of 1.22 mg/dL (H)).   Medical History: Past Medical History:  Diagnosis Date   Automatic implantable cardioverter-defibrillator in situ    Chronic CHF (congestive heart failure) ([place])    a. EF 15-20% b. RHC (09/2013) RA 14, RV 57/22, PA 64/36 (48), PCWP 18, FIck CO/CI 3.7/1.6, PVR 8.1 WU, PA sat 47%    History of hypothyroidism    History of stomach ulcers    Hyperthyroidism    Hypotension    Morbid obesity ([place])    Myocardial infarction ([place]) 08/2013   Nocturnal dyspnea    Nonischemic cardiomyopathy ([place])    Sinus tachycardia    Sleep apnea    Snoring-prob OSA 09/04/2011   [employer] ICD lead RECALL  6949    Stroke ([place]) 2021   some residual right-sided weakness   UARS (upper airway resistance syndrome) 09/04/2011   HST 12/2013:  AHI 4/hr (numerous episodes of airflow reduction that did not have concomitant desaturation)    Assessment: 51 year old female with LVAD implanted earlier this year. Patient being admitted for total hysterectomy on 8/16. Last dose of warfarin was 8/10, lovenox bridge was started with last dose given 8/13pm.   Patient s/p hysterectomy  8/16. Procedure went well per LVAD coordinators. Ok per surgery to resume low dose heparin at 11pm tonight. Hgb this am was 10, INR 1.1.   Goal of Therapy:  Heparin level goal <0.3 Monitor platelets by anticoagulation protocol: Yes   Plan:  Restart heparin infusion at 400 units/hr tonight  Daily heparin level and CBC  Monitor s/s bleeding   Erin Hearing PharmD., BCPS Clinical Pharmacist 02/05/2022 2:57 PM

## 2022-02-05 NOTE — Op Note (Addendum)
OPERATIVE NOTE  Pre-operative Diagnosis: Complex adnexal mass, enlarged uterus  Post-operative Diagnosis: same, benign ovarian cyst  Operation: Robotic-assisted laparoscopic total hysterectomy with bilateral salpingoophorectomy   Surgeon: Jeral Pinch MD  Assistant Surgeon: Joylene John NP  Anesthesia: GET  Urine Output: 350 cc  Operative Findings:  On EUA, enlarged mobile uterus, large mobile and smooth mass filling the cul-de-sac.  On intra-abdominal entry, normal upper abdominal survey including stomach, liver edge, and diaphragm.  LVAD cords seen above the peritoneum.  Normal-appearing small and large bowel.  Some filmy adhesions of the cecum to the right lateral sidewall.  Approximately 12 cm cystic lesion replacing the left ovary with dilated fallopian tube.  Smooth cystic mass of the ovary had some filmy adhesions to the left pelvic sidewall peritoneum and posterior cul-de-sac peritoneum.  Right fallopian tube and ovary normal in appearance.  Uterus enlarged measuring 12 cm and bulbous.  No identifiable fibroids.  No ascites.  No intra-abdominal or pelvic evidence of disease. On cystoscopy, intact bladder dome with bubble seen.  Good efflux noted from bilateral ureteral orifices. Frozen section consistent with benign ovarian cyst.  Estimated Blood Loss:  100 cc      Total IV Fluids: see I&O flowsheet         Specimens: uterus, cervix, bilateral tubes and ovaries, pelvic washings         Complications:  None apparent; patient tolerated the procedure well.         Disposition: PACU - hemodynamically stable.  Procedure Details  The patient was seen in the Holding Room. The risks, benefits, complications, treatment options, and expected outcomes were discussed with the patient.  The patient concurred with the proposed plan, giving informed consent.  The site of surgery properly noted/marked. The patient was identified as [name] and the procedure verified as a  Robotic-assisted hysterectomy with bilateral salpingo oophorectomy.   After induction of anesthesia, the patient was draped and prepped in the usual sterile manner. Patient was placed in supine position after anesthesia and draped and prepped in the usual sterile manner as follows: Her arms were tucked to her side with all appropriate precautions.  The patient was secured to the bed using padding in tape.  Her LVAD cord was secured to this padding.  The patient was placed in the semi-lithotomy position in [place].  The perineum and vagina were prepped with CholoraPrep. The patient was draped after the CholoraPrep had been allowed to dry for 3 minutes.  A Time Out was held and the above information confirmed.  The urethra was prepped with Betadine. Foley catheter was placed.  A sterile speculum was placed in the vagina.  The cervix was grasped with a single-tooth tenaculum. The cervix was dilated with Kennon Rounds dilators.  The Delineator uterine manipulator with a colpotomizer ring was placed without difficulty.  A pneum occluder balloon was placed over the manipulator.  OG tube placement was confirmed and to suction.   Next, a 5 mm skin incision was made 1 cm below the subcostal margin in the midclavicular line.  The 5 mm Optiview port and scope was used for direct entry.  Opening pressure was under 10 mm CO2.  The abdomen was insufflated and the findings were noted as above.   At this point and all points during the procedure, the patient's intra-abdominal pressure did not exceed 15 mmHg. Next, an 8 mm skin incision was made superior to the umbilicus and a right and left port were placed about 8 cm  lateral to the robot port on the right and left side.  A fourth arm was placed on the right.  A second assist trocar was placed in the right upper abdomen with a 12 mm port.  All ports were placed under direct visualization.  The patient was placed in steep Trendelenburg.  The robot was docked in the normal  manner.  The right and left peritoneum were opened parallel to the IP ligament to open the retroperitoneal spaces bilaterally. The round ligaments were transected. The ureter was noted to be on the medial leaf of the broad ligament.  The peritoneum above the ureter was incised and stretched and the infundibulopelvic ligament was skeletonized, cauterized and cut.  On the left, the fallopian tube and utero-ovarian ligament were skeletonized, cauterized, and transected just lateral to the uterine fundus.  Filmy adhesions of the adnexal cyst to the pelvic peritoneum were lysed sharply, freeing the left adnexa.  This was placed in an Endo Catch bag inserted through the assist trocar.  The posterior peritoneum was taken down to the level of the KOH ring.  The anterior peritoneum was also taken down.  The bladder flap was created to the level of the KOH ring.  The uterine artery on the right side was skeletonized, cauterized and cut in the normal manner.  A similar procedure was performed on the left.  The colpotomy was made and the uterus, cervix, bilateral ovaries and tubes were amputated, placed in an Endo Catch bag and delivered through the vagina.  Pedicles were inspected and excellent hemostasis was achieved.  The left adnexa was also delivered in its Endo Catch bag through the vagina.  Adnexa was then sent for frozen section.  The colpotomy at the vaginal cuff was closed with Vicryl on a CT1 needle in running manner.  2 small areas of the bladder where the serosa had been disrupted during creation of the bladder flap were oversewn using 2-0 Vicryl.  Cystoscopy was then performed with findings noted above after the bladder was instilled with approximately 150 cc of sterile fluid.  The catheter was replaced.  Attention was turned back intra-abdominal he.  At this point frozen section returned.  Irrigation was used and excellent hemostasis was achieved.  Given need to restart heparin drip postop, Floseal and  Arista were placed on the operative bed.  Pressure was then turned down to 5 mmHg with good hemostasis noted.    At this point in the procedure was completed.  Robotic instruments were removed under direct visulaization.  The robot was undocked. The fascia at the 10-12 mm port was closed with 0 Vicryl on a UR-5 needle.  The subcuticular tissue was closed with 4-0 Vicryl and the skin was closed with 4-0 Monocryl using mattress stitches.  Dermabond was applied.    The vagina was swabbed with minimal bleeding noted.   All sponge, lap and needle counts were correct x  3.   The patient was transferred to the recovery room in stable condition.  Jeral Pinch, MD

## 2022-02-05 NOTE — Anesthesia Procedure Notes (Signed)
Arterial Line Insertion Start/End8/16/2023 11:14 AM, 02/05/2022 11:22 AM Performed by: Oleta Mouse, MD, anesthesiologist  Patient location: OR. Preanesthetic checklist: patient identified, IV checked, site marked, risks and benefits discussed, surgical consent, monitors and equipment checked, pre-op evaluation, timeout performed and anesthesia consent Patient sedated Left, brachial was placed Catheter size: 20 G Hand hygiene performed  and maximum sterile barriers used   Attempts: 1 Procedure performed without using ultrasound guided technique. Following insertion, dressing applied and Biopatch. Post procedure assessment: normal and unchanged  Patient tolerated the procedure well with no immediate complications.

## 2022-02-05 NOTE — TOC Progression Note (Signed)
Transition of Care [facility]) - Progression Note    Patient Details  Name: [name] MRN: [id number] Date of Birth: 29-Dec-1970  Transition of Care [facility] CM/SW [place], RN Phone Number:[phone]  02/05/2022, 4:07 PM  Clinical Narrative:    Transition of Care [facility]) Screening Note   Patient Details  Name: [name] Date of Birth: 02-Jan-1971   Transition of Care [facility]) CM/SW Contact:    Angelita Ingles, RN Phone Number: 02/05/2022, 4:08 PM    Transition of Care Department [facility]) has reviewed patient and no TOC needs have been identified at this time. We will continue to monitor patient advancement through interdisciplinary progression rounds.            Expected Discharge Plan and Services                                                 Social Determinants of Health (SDOH) Interventions    Readmission Risk Interventions    09/17/2021   10:03 AM 07/19/2021    3:49 PM 04/20/2020    2:17 PM  Readmission Risk Prevention Plan  Transportation Screening Complete Complete Complete  PCP or Specialist Appt within 5-7 Days  Complete Complete  Home Care Screening  Complete Complete  Medication Review (RN CM)  Complete Complete  PCP or Specialist appointment within 3-5 days of discharge Complete    HRI or [place] Complete    SW Recovery Care/Counseling Consult Complete    [place] Not Applicable

## 2022-02-05 NOTE — Progress Notes (Signed)
Transported to short stay by bed awake and alert.

## 2022-02-05 NOTE — Transfer of Care (Signed)
Immediate Anesthesia Transfer of Care Note  Patient: [name]  Procedure(s) Performed: XI ROBOTIC ASSISTED TOTAL HYSTERECTOMY WITH BILATERAL SALPINGO OOPHORECTOMY, CYSTOSCOPY, UTERUS GREATER THAN 250 GRAMS  Patient Location: PACU  Anesthesia Type:General  Level of Consciousness: awake, alert  and oriented  Airway & Oxygen Therapy: Patient Spontanous Breathing  Post-op Assessment: Report given to RN and Post -op Vital signs reviewed and stable  Post vital signs: Reviewed and stable  Last Vitals: SEE PACU vitals. Nonpulsatile bloodflow due to HMIII. No SpO2 measurement. Pt. Breathing comfortably on RA.  Vitals Value Taken Time  BP    Temp    Pulse 78   Resp 14 02/05/22 1504  SpO2    Vitals shown include unvalidated device data.  Last Pain:  Vitals:   02/05/22 1031  TempSrc: Oral  PainSc:          Complications: No notable events documented.

## 2022-02-05 NOTE — Anesthesia Preprocedure Evaluation (Deleted)
Anesthesia Evaluation    History of Anesthesia Complications Negative for: history of anesthetic complications  Airway        Dental   Pulmonary           Cardiovascular      Neuro/Psych    GI/Hepatic   Endo/Other    Renal/GU      Musculoskeletal   Abdominal   Peds  Hematology  (+) Blood dyscrasia, anemia , Lab Results      Component                Value               Date                      WBC                      7.1                 02/05/2022                HGB                      10.2 (L)            02/05/2022                HCT                      32.6 (L)            02/05/2022                MCV                      84.7                02/05/2022                PLT                      227                 02/05/2022              Anesthesia Other Findings   Reproductive/Obstetrics                             Anesthesia Physical Anesthesia Plan Anesthesia Quick Evaluation

## 2022-02-05 NOTE — Progress Notes (Signed)
VAD Coordinator Procedure Note:   VAD Coordinator met patient in short stay. Pt undergoing robotic hysterectomy per Dr. [name]. Hemodynamics and VAD parameters monitored by myself and anesthesia throughout the procedure. Blood pressures were obtained with left brachial arterial line.    Time: Doppler Arterial  BP Flow PI Power Speed  Pre-procedure:  1036  96/74(79) 5.3 2.6 4.8 5900   1143  133/111(118) 5.3 3 4.8 5900           Sedation Induction: 1153  100/94(97) 5.2 2.9 4.8 5900   1156  106/99(103) 5 4.1 4.8 5900   1208  76/70(75) 5.3 2 4.8 5900   1230  111/97(103) 5.2 3 4.7 5900   1245  125/108(118) 5.1 3.5 4.8 5900   1300  102/81(92) 5.5 2 4.7 5900   1315  103/83(93) 5.1 2 4.7 5900   1330  116/85(94) 5 1.9 4.6 5900   1345  114/90(99) 5 2.1 4.7 5900   1400  108/86(94) 5.2 2.1 4.7 5900   1415  107/93(99) 5.3 2.5 4.7 5900   1430  103/82(91) 5.2 3 4.7 5900   1445  133/110(119) 5 3 4.7 5900  Recovery Area: 1502  94/70(80 5.3 3.1 4.8 5900   1515  98/78(85) 5.2 3.1 4.7 5900   1530  100/87(92) 5.2 3.1 4.7 5900   1545  101/88(95) 5.2 3.1 4.7 5900    Patient tolerated the procedure well. VAD Coordinator accompanied and remained with patient in recovery area. Patient returned to 1S25 with no complications. Report given to Chat RN.   Patient Disposition: [phone]

## 2022-02-06 ENCOUNTER — Encounter (HOSPITAL_COMMUNITY): Payer: Self-pay | Admitting: Gynecologic Oncology

## 2022-02-06 DIAGNOSIS — I5022 Chronic systolic (congestive) heart failure: Secondary | ICD-10-CM | POA: Diagnosis not present

## 2022-02-06 DIAGNOSIS — N9489 Other specified conditions associated with female genital organs and menstrual cycle: Secondary | ICD-10-CM | POA: Diagnosis not present

## 2022-02-06 DIAGNOSIS — Z95811 Presence of heart assist device: Secondary | ICD-10-CM | POA: Diagnosis not present

## 2022-02-06 LAB — BASIC METABOLIC PANEL
Anion gap: 6 (ref 5–15)
BUN: 14 mg/dL (ref 6–20)
CO2: 26 mmol/L (ref 22–32)
Calcium: 9.2 mg/dL (ref 8.9–10.3)
Chloride: 106 mmol/L (ref 98–111)
Creatinine, Ser: 1.31 mg/dL — ABNORMAL HIGH (ref 0.44–1.00)
GFR, Estimated: 50 mL/min — ABNORMAL LOW (ref >60)
Glucose, Bld: 110 mg/dL — ABNORMAL HIGH (ref 70–99)
Potassium: 4.8 mmol/L (ref 3.5–5.1)
Sodium: 138 mmol/L (ref 135–145)

## 2022-02-06 LAB — LACTATE DEHYDROGENASE: LDH: 181 U/L (ref 98–192)

## 2022-02-06 LAB — CBC
HCT: 35.1 % — ABNORMAL LOW (ref 36.0–46.0)
Hemoglobin: 10.8 g/dL — ABNORMAL LOW (ref 12.0–15.0)
MCH: 26 pg (ref 26.0–34.0)
MCHC: 30.8 g/dL (ref 30.0–36.0)
MCV: 84.4 fL (ref 80.0–100.0)
Platelets: 292 10*3/uL (ref 150–400)
RBC: 4.16 MIL/uL (ref 3.87–5.11)
RDW: 15.3 % (ref 11.5–15.5)
WBC: 19.5 10*3/uL — ABNORMAL HIGH (ref 4.0–10.5)
nRBC: 0 % (ref 0.0–0.2)

## 2022-02-06 LAB — HEPARIN LEVEL (UNFRACTIONATED): Heparin Unfractionated: <0.1 IU/mL — ABNORMAL LOW (ref 0.30–0.70)

## 2022-02-06 LAB — PROTIME-INR
INR: 1.1 (ref 0.8–1.2)
Prothrombin Time: 14.3 seconds (ref 11.4–15.2)

## 2022-02-06 MED ORDER — ORAL CARE MOUTH RINSE: 15.0000 mL | OROMUCOSAL | Status: DC | PRN

## 2022-02-06 MED ORDER — WARFARIN SODIUM 5 MG PO TABS
5.0000 mg | ORAL_TABLET | Freq: Once | ORAL | Status: AC
Start: 2022-02-06 — End: 2022-02-06
  Administered 2022-02-06: 5 mg via ORAL
  Filled 2022-02-06: qty 1

## 2022-02-06 MED ORDER — WARFARIN - PHARMACIST DOSING INPATIENT: Freq: Every day | Status: DC

## 2022-02-06 NOTE — TOC Initial Note (Addendum)
Transition of Care [facility]) - Initial/Assessment Note    Patient Details  Name: [name] MRN: [id number] Date of Birth: 1971/01/27  Transition of Care [facility]) CM/SW Contact:    Angelita Ingles, RN Phone Number:[phone]  02/06/2022, 1:19 PM  Clinical Narrative:                 TOC following patient with high risk for readmission. CM at bedside introduces self and explains role. Patient is alert, oriented and pleasant. Patient states that she is from home with her husband where she normally functions independently. Patient reports that she has been searching for a PCP and her Cardiologist is  [name], MD. Patient states that she does have insurance coverage to cover meds and that meds are affordable. Home DME includes a rolling walker but patient states that she does not need to use the walker. Patient does have LVAD and states that she has been able to manage without difficulty. Patient does not have any home health services.Transition of Care Department [facility]) has reviewed patient and no TOC needs have been identified at this time. We will continue to monitor patient advancement through interdisciplinary progression rounds  Expected Discharge Plan: Home/Self Care Barriers to Discharge: Continued Medical Work up   Patient Goals and CMS Choice Patient states their goals for this hospitalization and ongoing recovery are:: Wants to get better and go home CMS Medicare.gov Compare Post Acute Care list provided to::  (n/a) Choice offered to / list presented to : NA  Expected Discharge Plan and Services Expected Discharge Plan: Home/Self Care In-house Referral: NA Discharge Planning Services: CM Consult Post Acute Care Choice: NA Living arrangements for the past 2 months: Single Family Home                 DME Arranged: N/A DME Agency: NA       HH Arranged: NA HH Agency: NA        Prior Living Arrangements/Services Living arrangements for the past 2 months: Single Family  Home Lives with:: Spouse Patient language and need for interpreter reviewed:: Yes Do you feel safe going back to the place where you live?: Yes      Need for Family Participation in Patient Care: No (Comment) Care giver support system in place?: Yes (comment) Current home services:  (n/a) Criminal Activity/Legal Involvement Pertinent to Current Situation/Hospitalization: No - Comment as needed  Activities of Daily Living Home Assistive Devices/Equipment: None ADL Screening (condition at time of admission) Patient's cognitive ability adequate to safely complete daily activities?: Yes Is the patient deaf or have difficulty hearing?: No Does the patient have difficulty seeing, even when wearing glasses/contacts?: No Does the patient have difficulty concentrating, remembering, or making decisions?: No Patient able to express need for assistance with ADLs?: Yes Does the patient have difficulty dressing or bathing?: No Independently performs ADLs?: Yes (appropriate for developmental age) Does the patient have difficulty walking or climbing stairs?: No Weakness of Legs: None Weakness of Arms/Hands: None  Permission Sought/Granted Permission sought to share information with : Family Supports    Share Information with NAME: Jules Vidovich     Permission granted to share info w Relationship: spouse  Permission granted to share info w Contact Information: [phone]  Emotional Assessment Appearance:: Appears stated age Attitude/Demeanor/Rapport: Gracious Affect (typically observed): Accepting, Pleasant Orientation: : Oriented to Self, Oriented to Place, Oriented to  Time, Oriented to Situation Alcohol / Substance Use: Not Applicable Psych Involvement: No (comment)  Admission diagnosis:  Mass of uterine adnexa [N94.89] Patient Active Problem List   Diagnosis Date Noted   Abnormal uterine bleeding (AUB)    Mass of uterine adnexa 02/03/2022   Adnexal mass 10/11/2021   Transaminitis     LVAD (left ventricular assist device) present ([place])    Hypokalemia    Paroxysmal atrial fibrillation ([place])    Debility 09/20/2021   Malnutrition of moderate degree 08/30/2021   Arterial line in place    Decompensated heart failure ([place]) 08/26/2021   Acute CHF (congestive heart failure) ([place]) 07/18/2021   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   Middle cerebral artery embolism, 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   ICD (implantable cardioverter-defibrillator) discharge 09/27/2018   Vitamin D deficiency 10/19/2017   Insulin resistance 10/19/2017   Hypotension 06/03/2016   Volume depletion 06/03/2016   Preoperative cardiovascular examination 10/25/2014   Hypothyroidism 04/26/2014   Pulmonary hypertension ([place]) 02/07/2014   S[employer] Fidel[employer] ICD lead RECALL  6949    UARS (upper airway resistance syndrome) 09/04/2011   Encounter for fitting or adjustment of implantable cardioverter-defibrillator (ICD)    Obesity 03/12/2009   NICM (nonischemic cardiomyopathy) ([place]) 53/61/4431   SYSTOLIC HEART FAILURE, CHRONIC 03/12/2009   PCP:  Patient, No Pcp Per Pharmacy:   [facility] [place] [address] [place] [address] Phone: [phone] Fax: [phone]  [facility] [address] [place] [address] Phone: [phone] Fax: [phone]     Social Determinants of Health (SDOH) Interventions    Readmission Risk Interventions    02/06/2022   12:34 PM 09/17/2021   10:03 AM 07/19/2021    3:49 PM  Readmission Risk Prevention Plan  Transportation Screening Complete Complete Complete  PCP or Specialist Appt within 5-7 Days   Complete  PCP or Specialist Appt within 3-5 Days Complete    Home Care Screening   Complete  Medication Review (RN CM)   Complete  HRI or Home Care Consult Complete    Social Work Consult for [place] Planning/Counseling Complete     Palliative Care Screening Not Applicable    Medication Review Press photographer) Referral to Pharmacy    PCP or Specialist appointment within 3-5 days of discharge  Complete   HRI or [place]  Complete   SW Recovery Care/Counseling Consult  Complete   [place]  Not Applicable

## 2022-02-06 NOTE — Progress Notes (Signed)
GYN Oncology Progress Note  Called to check on patient's postoperative status. She states she is doing pretty good.  She had her catheter removed around 1 hour ago.  She continues to tolerate her diet with no nausea or emesis. She went for a walk in the hall and tolerated this well with no lightheadedness or dizziness. She has only needed to take oral pain medication once after her walk. No concerns or needs voiced. Advised patient we would see her tomorrow and are available for any needs at any time.

## 2022-02-06 NOTE — Progress Notes (Signed)
LVAD Coordinator Rounding Note:  Planned admission 02/03/22 to [facility] for heparin bridge per Dr [name]. Pt posted for total hysterectomy.  Patient sitting up in bed alert and oriented with no complaints of pain this AM. Patient states she has minimal vaginal bleeding since procedure yesterday. Patient has been ambulating without any complications.   Vital signs: Temp: 98.1 HR: 79 Doppler Pressure: 94 Auto BP:  102/58 (70) O2 Sat: 100% on RA  Wt:  237.4>235.4>236>235.4lbs  LVAD interrogation:  Speed: 5900 Flow: 5.4 Power: 4.8w PI: 2.8 Hct: 20-DO NOT ADJUST  Alarms: none Events:3 PI events today, none yesterday   Fixed speed: 5900 Low speed limit: 5600  Drive Line: CDI. Drive line anchor secure. Weekly dressing changes by bedside nurse. Next dressing change due: 02/10/22.    Labs:  LDH trend:  207>182>181  INR trend:  1.2>1.1>1.1  Hgb: 11.1>10.2>10.8   Anticoagulation Plan: -INR Goal: 2.0 - 2.5 -ASA Dose: 81 mg  -Heparin gtt 400 u/hr  Device: Medtronic single ICD - Therapies: on 200 bpm - Pacing:  VVI 40 -  Last check 09/18/21 (therapy turned on after surgery/transfer to [facility])          Plan/Recommendations:  1. Page VAD coordinators with VAD equipment and drive line concerns. 2. Weekly dressing change per VAD coordinator or caregiver. Next dressing change due 02/11/22.   Bobbye Morton RN [place] Coordinator  Office: [phone]  24/7 Pager: [phone]

## 2022-02-06 NOTE — Anesthesia Preprocedure Evaluation (Signed)
Anesthesia Evaluation  Patient identified by MRN, date of birth, ID band Patient awake    Reviewed: Allergy & Precautions, NPO status , Patient's Chart, lab work & pertinent test results  History of Anesthesia Complications Negative for: history of anesthetic complications  Airway Mallampati: III  TM Distance: >3 FB Neck ROM: Full    Dental  (+) Teeth Intact, Dental Advisory Given   Pulmonary shortness of breath, sleep apnea ,    breath sounds clear to auscultation       Cardiovascular hypertension, (-) angina+ Past MI and +CHF  + Cardiac Defibrillator  Rhythm:Irregular     Medtronic AICD   Neuro/Psych  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 CVA    GI/Hepatic negative GI ROS, Neg liver ROS,   Endo/Other  Hypothyroidism   Renal/GU CRFRenal diseaseLab Results      Component                Value               Date                      CREATININE               1.31 (H)            02/06/2022                Musculoskeletal   Abdominal   Peds  Hematology  (+) Blood dyscrasia, anemia , Lab Results      Component                Value               Date                      WBC                      19.5 (H)            02/06/2022                HGB                      10.8 (L)            02/06/2022                HCT                      35.1 (L)            02/06/2022                MCV                      84.4                02/06/2022                PLT                      292                 02/06/2022               Anesthesia Other Findings lvad  Reproductive/Obstetrics  Anesthesia Physical Anesthesia Plan  ASA: 4  Anesthesia Plan: General   Post-op Pain Management: Ofirmev IV (intra-op)*   Induction: Intravenous  PONV Risk Score and Plan: 3 and Propofol infusion, Ondansetron, Dexamethasone, TIVA and Midazolam  Airway Management Planned: Oral  ETT  Additional Equipment: Arterial line, CVP and Ultrasound Guidance Line Placement  Intra-op Plan:   Post-operative Plan: Extubation in OR  Informed Consent: I have reviewed the patients History and Physical, chart, labs and discussed the procedure including the risks, benefits and alternatives for the proposed anesthesia with the patient or authorized representative who has indicated his/her understanding and acceptance.     Dental advisory given  Plan Discussed with: CRNA  Anesthesia Plan Comments:         Anesthesia Quick Evaluation

## 2022-02-06 NOTE — Anesthesia Postprocedure Evaluation (Signed)
Anesthesia Post Note  Patient: [name]  Procedure(s) Performed: XI ROBOTIC ASSISTED TOTAL HYSTERECTOMY WITH BILATERAL SALPINGO OOPHORECTOMY, CYSTOSCOPY, UTERUS GREATER THAN 250 GRAMS     Patient location during evaluation: PACU Anesthesia Type: General Level of consciousness: awake and alert Pain management: pain level controlled Vital Signs Assessment: post-procedure vital signs reviewed and stable Respiratory status: spontaneous breathing, nonlabored ventilation, respiratory function stable and patient connected to nasal cannula oxygen Cardiovascular status: blood pressure returned to baseline and stable Postop Assessment: no apparent nausea or vomiting Anesthetic complications: no   No notable events documented.  Last Vitals:  Vitals:   02/05/22 2310 02/06/22 0424  BP: (!) 85/56 91/66  Pulse: 81 80  Resp: 14 16  Temp: 36.6 C 36.7 C  SpO2: 98% 98%    Last Pain:  Vitals:   02/06/22 0507  TempSrc:   PainSc: 0-No pain                 Dontaye Hur

## 2022-02-06 NOTE — Progress Notes (Signed)
1 Day Post-Op Procedure(s) (LRB): XI ROBOTIC ASSISTED TOTAL HYSTERECTOMY WITH BILATERAL SALPINGO OOPHORECTOMY, CYSTOSCOPY, UTERUS GREATER THAN 250 GRAMS (N/A)  Subjective: Patient reports doing well this am. She has been out of bed and tolerated it well. States she feels better when out of bed. Tolerating diet with no nausea or emesis. No flatus reported. Mild abdominal soreness reported but no feelings of moderate pain. Feels her abdomen is less distended post-op compared with pre. Having mild amount of vaginal bleeding, denies heavy. Denies chest pain, dyspnea. No needs or concerns voiced.   Objective: Vital signs in last 24 hours: Temp:  [97 F (36.1 C)-98.1 F (36.7 C)] 98.1 F (36.7 C) (08/17 0753) Pulse Rate:  [80-113] 82 (08/17 0753) Resp:  [14-20] 17 (08/17 0753) BP: (80-107)/(50-85) 102/58 (08/17 0753) SpO2:  [93 %-100 %] 98 % (08/17 0753) Arterial Line BP: (73-102)/(63-92) 99/83 (08/17 0753) Weight:  [235 lb 7.2 oz (106.8 kg)-236 lb 1.8 oz (107.1 kg)] 235 lb 7.2 oz (106.8 kg) (08/17 0442) Last BM Date : 02/03/01  Intake/Output from previous day: 08/16 0701 - 08/17 0700 In: 1730.9 [P.O.:240; I.V.:1140.9; IV Piggyback:350] Out: 7322 [Urine:3075; Blood:100]  Physical Examination: General: alert, cooperative, and no distress Resp: clear to auscultation bilaterally Cardio: mechanical heart sounds from LVAD GI: abdomen soft, non-distended, active bowel sounds, lap sites incisions intact with dermabond with no drainage. Ecchymoses noted in the lower abdomen (some present pre-op due to injections) Extremities: extremities normal, atraumatic, no cyanosis or edema  Labs: WBC/Hgb/Hct/Plts:  19.5/10.8/35.1/292 (08/17 0430) BUN/Cr/glu/ALT/AST/amyl/lip:  14/1.31/--/--/--/--/-- (08/17 0430)  Assessment: 51 y.o. s/p Procedure(s): XI ROBOTIC ASSISTED TOTAL HYSTERECTOMY WITH BILATERAL SALPINGO OOPHORECTOMY, CYSTOSCOPY, UTERUS GREATER THAN 250 GRAMS: stable. Meeting early milestones-  tolerating diet, ambulating. Pain:  Pain is well-controlled on PRN medications.  Heme: Hgb 10.8 and Hct 35.1 this am-appropriate given pre-op values, surgical losses.  ID: WBC 19.5 this am- likely reactive, received 10 mg decadron intraop. No signs of infection. Given ancef intraop as well.   CV: Currently managed by cardiology service, admitted pre-operatively for heparin bridge, LVAD management. Hx of chronic systolic HF with nonischemic cardiomyopathy.  She has a Medtronic ICD. 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 Hx CVA. Echo 02/04/22: septal wall bows mildly towards the RV and no significant MR. IVC small. Currently back on heparin drip managed per pharmacy. This was discontinued 6 hours before surgery and held for 8 hours after. To be bridged back to Coumadin. INR 1.1 this am.  GI:  Tolerating po: Yes. Antiemetics ordered as needed.   GU: Creatinine 1.31 this am. Hx Stage 3 CKD. 3075 cc output reported for 8/16-8/17.   FEN: No critical values on am Bmet.  Prophylaxis: Currently on heparin drip. Admitted pre-operatively for heparin bridge. Plan for re-initiation of Coumadin.   Plan: Cleared to resume Coumadin tonight per [name], managed per Cardiology Pt requesting flutter valve over IS Plan for foley removal this am Encouraged ambulation and increasing mobility She is doing well post-operatively. Awaiting return of bowel function. Continue plan of care per Cardiology Team, [name]   LOS: 3 days    Dorothyann Gibbs 02/06/2022, 9:44 AM

## 2022-02-06 NOTE — Progress Notes (Signed)
ANTICOAGULATION CONSULT NOTE - Follow Up Consult  Pharmacy Consult for heparin>>warfarin Indication:  LVAD  No Known Allergies  Patient Measurements: Height: '5\' 3"'$  (160 cm) Weight: 106.8 kg (235 lb 7.2 oz) IBW/kg (Calculated) : 52.4 Heparin Dosing Weight: 78kg  Vital Signs: Temp: 98.1 F (36.7 C) (08/17 0753) Temp Source: Oral (08/17 0753) BP: 102/58 (08/17 0753) Pulse Rate: 82 (08/17 0753)  Labs: Recent Labs    02/04/22 0029 02/04/22 2117 02/05/22 0030 02/05/22 1305 02/06/22 0430  HGB 10.4* 10.7* 10.2* 10.9* 10.8*  HCT 32.9* 34.0* 32.6* 32.0* 35.1*  PLT 386 253 227  --  292  LABPROT 15.2  --  14.3  --  14.3  INR 1.2  --  1.1  --  1.1  HEPARINUNFRC <0.10*  --  <0.10*  --  <0.10*  CREATININE 1.41* 1.25* 1.22*  --  1.31*     Estimated Creatinine Clearance: 60.2 mL/min (A) (by C-G formula based on SCr of 1.31 mg/dL (H)).   Medical History: Past Medical History:  Diagnosis Date   Automatic implantable cardioverter-defibrillator in situ    Chronic CHF (congestive heart failure) ([place])    a. EF 15-20% b. RHC (09/2013) RA 14, RV 57/22, PA 64/36 (48), PCWP 18, FIck CO/CI 3.7/1.6, PVR 8.1 WU, PA sat 47%    History of hypothyroidism    History of stomach ulcers    Hyperthyroidism    Hypotension    Morbid obesity ([place])    Myocardial infarction ([place]) 08/2013   Nocturnal dyspnea    Nonischemic cardiomyopathy ([place])    Sinus tachycardia    Sleep apnea    Snoring-prob OSA 09/04/2011   [employer] Fidel[employer] ICD lead RECALL  6949    Stroke ([place]) 2021   some residual right-sided weakness   UARS (upper airway resistance syndrome) 09/04/2011   HST 12/2013:  AHI 4/hr (numerous episodes of airflow reduction that did not have concomitant desaturation)    Assessment: 51 year old female with LVAD implanted earlier this year. Patient being admitted for total hysterectomy on 8/16. Last dose of warfarin was 8/10, lovenox bridge was started with last dose given 8/13pm.   Patient s/p  hysterectomy 8/16. Low dose heparin resume last night 8/16. Orders to add back warfarin today. INR normal at 1.1 after not having warfarin for almost a week.   Prior to admission warfarin dose was '2mg'$  TuTrSa and '4mg'$  all other days.   Goal of Therapy:  INR goal 2-2.5 Heparin level goal <0.3 Monitor platelets by anticoagulation protocol: Yes   Plan:  Continue heparin infusion at 400 units/hr  Warfarin '5mg'$  today Daily heparin level and CBC  Monitor s/s bleeding   Erin Hearing PharmD., BCPS Clinical Pharmacist 02/06/2022 9:08 AM

## 2022-02-06 NOTE — Progress Notes (Signed)
Patient refused foley out  right now, wants to wait until late this afternoon, she just took fluid pill and wants to keep foley in for that reason

## 2022-02-06 NOTE — Progress Notes (Addendum)
Advanced Heart Failure VAD Team Note  PCP-Cardiologist: [name], MD   Subjective:    S/p laparoscopic total hysterectomy with bilateral salpingoophorectomy.  POD #1. Feels well. Minimal pain. Laparoscopic port sites look good. Ate regular diet this morning.   WBC 7.1>>19.5K. AF. Denies infectious symptoms. No fever/chills.    Breathing well. Heparin gtt started last night. INR 1.1 today. Hgb stable 10.8.   MAPs upper 80s-90s   VAD speed increased to 5900 after echo 8/15  LVAD INTERROGATION:  HeartMate III LVAD:   Flow 5.4 liters/min, speed 5900, power 4.8, PI 2.9.  3 PI events. VAD interrogated personally  Objective:    Vital Signs:   Temp:  [97 F (36.1 C)-98.1 F (36.7 C)] 98.1 F (36.7 C) (08/17 0753) Pulse Rate:  [80-113] 82 (08/17 0753) Resp:  [14-20] 17 (08/17 0753) BP: (80-107)/(50-85) 102/58 (08/17 0753) SpO2:  [93 %-100 %] 98 % (08/17 0753) Arterial Line BP: (73-102)/(63-92) 99/83 (08/17 0753) Weight:  [106.8 kg-107.1 kg] 106.8 kg (08/17 0442) Last BM Date : 02/03/01 Mean arterial Pressure 80s  Intake/Output:   Intake/Output Summary (Last 24 hours) at 02/06/2022 0859 Last data filed at 02/06/2022 0753 Gross per 24 hour  Intake 1750.12 ml  Output 3175 ml  Net -1424.88 ml     Physical Exam    Physical Exam: GENERAL: well appearing, up in bed. No distress  HEENT: normal  NECK: Supple, JVP not elevated.  2+ bilaterally, no bruits.   CARDIAC:  Mechanical heart sounds with LVAD hum present.  LUNGS:  CTAB. No wheezing  ABDOMEN:  Soft, round, nontender, positive bowel sounds x4.  Laparoscopic port sites closed, no drainage or erythremia    LVAD exit site: well-healed and incorporated.  Dressing dry and intact.  No erythema or drainage.  Stabilization device present and accurately applied.   EXTREMITIES:  Warm and dry, no cyanosis, clubbing, rash or edema  NEUROLOGIC:  Alert and oriented x 4.  Gait steady.  No aphasia.  No dysarthria.  Affect  pleasant.       Telemetry   NSR 70s, personally reviewed   EKG    N/A   Labs   Basic Metabolic Panel: Recent Labs  Lab 02/03/22 1049 02/04/22 0029 02/04/22 2117 02/05/22 0030 02/05/22 1305 02/06/22 0430  NA 140 138 138 138 138 138  K 4.2 4.1 4.2 4.1 3.9 4.8  CL 108 108 108 107  --  106  CO2 '25 24 23 25  '$ --  26  GLUCOSE 109* 97 103* 96  --  110*  BUN '14 16 16 15  '$ --  14  CREATININE 1.42* 1.41* 1.25* 1.22*  --  1.31*  CALCIUM 9.0 8.9 9.0 8.8*  --  9.2  MG  --  1.9  --   --   --   --     Liver Function Tests: Recent Labs  Lab 02/03/22 1049 02/04/22 2117  AST 17 17  ALT 13 12  ALKPHOS 61 56  BILITOT 0.5 0.5  PROT 6.5 6.1*  ALBUMIN 3.7 3.5   No results for input(s): "LIPASE", "AMYLASE" in the last 168 hours. No results for input(s): "AMMONIA" in the last 168 hours.  CBC: Recent Labs  Lab 02/03/22 1049 02/04/22 0029 02/04/22 2117 02/05/22 0030 02/05/22 1305 02/06/22 0430  WBC 5.0 6.5 7.1 7.1  --  19.5*  NEUTROABS 3.4  --   --   --   --   --   HGB 11.1* 10.4* 10.7* 10.2* 10.9* 10.8*  HCT 35.3* 32.9* 34.0* 32.6* 32.0* 35.1*  MCV 84.0 83.9 83.7 84.7  --  84.4  PLT 280 386 253 227  --  292    INR: Recent Labs  Lab 02/03/22 1049 02/04/22 0029 02/05/22 0030 02/06/22 0430  INR 1.2 1.2 1.1 1.1    Other results: EKG:    Imaging   DG CHEST PORT 1 VIEW  Result Date: 02/05/2022 CLINICAL DATA:  Preop chest exam, right central line EXAM: PORTABLE CHEST 1 VIEW COMPARISON:  Radiograph 09/23/2021 FINDINGS: Unchanged cardiomegaly with prior median sternotomy, AICD leads, and LVAD. There is a right neck approach central venous catheter tip overlying the right atrium. There is no focal airspace consolidation. No pleural effusion. No pneumothorax. No acute osseous abnormality. Mild gaseous distension of bowel in the upper abdomen. IMPRESSION: Right neck central venous catheter tip overlies the right atrium. No focal airspace disease.  Unchanged cardiomegaly.  Electronically Signed   By: Maurine Simmering M.D.   On: 02/05/2022 15:45   ECHOCARDIOGRAM COMPLETE  Result Date: 02/04/2022    ECHOCARDIOGRAM REPORT   Patient Name:   [name] Date of Exam: 02/04/2022 Medical Rec #:  [id number]    Height:       63.0 in Accession #:    [id number]   Weight:       235.4 lb Date of Birth:  31-Dec-1970    BSA:          2.073 m Patient Age:    51 years     BP:           0/0 mmHg Patient Gender: F            HR:           90 bpm. Exam Location:  Inpatient Procedure: 2D Echo, Color Doppler and Cardiac Doppler Indications:    R48.54 Chronic systolic (congestive) heart failure  History:        Patient has prior history of Echocardiogram examinations, most                 recent 09/18/2021. CHF; Defibrillator and Pacemaker. HM III Speed                 5800.  Sonographer:    Raquel Sarna Senior RDCS Referring Phys: [place]  1. HM3 LVAD 5800 rpm. EF <20%. LV septum bows into the RV. No MR on color doppler interrogation. AoV appears closed with trivial AI. Trivial TR. Poor quality study. Left ventricular ejection fraction, by estimation, is <20%. The left ventricle has severely decreased function. The left ventricle demonstrates global hypokinesis. The left ventricular internal cavity size was mildly to moderately dilated. Left ventricular diastolic function could not be evaluated.  2. Right ventricular systolic function was not well visualized. The right ventricular size is not well visualized. Tricuspid regurgitation signal is inadequate for assessing PA pressure.  3. The mitral valve was not well visualized. No evidence of mitral valve regurgitation.  4. The aortic valve was not well visualized. Aortic valve regurgitation is trivial. No aortic stenosis is present.  5. The inferior vena cava is normal in size with greater than 50% respiratory variability, suggesting right atrial pressure of 3 mmHg. Comparison(s): No significant change from prior study. FINDINGS  Left  Ventricle: HM3 LVAD 5800 rpm. EF <20%. LV septum bows into the RV. No MR on color doppler interrogation. AoV appears closed with trivial AI. Trivial TR. Poor quality study. Left ventricular ejection fraction, by estimation, is <  20%. The left ventricle has severely decreased function. The left ventricle demonstrates global hypokinesis. The left ventricular internal cavity size was mildly to moderately dilated. There is no left ventricular hypertrophy. Left ventricular diastolic function could  not be evaluated due to nondiagnostic images. Left ventricular diastolic function could not be evaluated. Right Ventricle: The right ventricular size is not well visualized. Right vetricular wall thickness was not well visualized. Right ventricular systolic function was not well visualized. Tricuspid regurgitation signal is inadequate for assessing PA pressure. Left Atrium: Left atrial size was not well visualized. Right Atrium: Right atrial size was not well visualized. Pericardium: There is no evidence of pericardial effusion. Mitral Valve: The mitral valve was not well visualized. No evidence of mitral valve regurgitation. Tricuspid Valve: The tricuspid valve is not well visualized. Tricuspid valve regurgitation is not demonstrated. Aortic Valve: The aortic valve was not well visualized. Aortic valve regurgitation is trivial. No aortic stenosis is present. Pulmonic Valve: The pulmonic valve was grossly normal. Pulmonic valve regurgitation is trivial. No evidence of pulmonic stenosis. Aorta: The aortic root and ascending aorta are structurally normal, with no evidence of dilitation. Venous: The inferior vena cava is normal in size with greater than 50% respiratory variability, suggesting right atrial pressure of 3 mmHg. IAS/Shunts: The interatrial septum was not well visualized.  LEFT VENTRICLE PLAX 2D LVIDd:         6.00 cm   Diastology LVIDs:         5.90 cm   LV e' medial:  5.00 cm/s LV PW:         1.00 cm   LV e' lateral:  6.31 cm/s LV IVS:        0.90 cm LVOT diam:     2.20 cm LVOT Area:     3.80 cm  RIGHT VENTRICLE RV S prime:     8.49 cm/s TAPSE (M-mode): 1.6 cm LEFT ATRIUM             Index LA diam:        5.50 cm 2.65 cm/m LA Vol (A2C):   80.1 ml 38.65 ml/m LA Vol (A4C):   89.3 ml 43.09 ml/m LA Biplane Vol: 89.1 ml 42.99 ml/m   AORTA Ao Root diam: 3.20 cm Ao Asc diam:  3.30 cm  SHUNTS Systemic Diam: 2.20 cm [name] Chiquito MD Electronically signed by [name] Chiquito MD Signature Date/Time: 02/04/2022/9:45:05 AM    Final      Medications:     Scheduled Medications:  aspirin EC  81 mg Oral Daily   Chlorhexidine Gluconate Cloth  6 each Topical Daily   methimazole  10 mg Oral BID   potassium chloride  20 mEq Oral Daily   predniSONE  10 mg Oral Q breakfast   quiniDINE gluconate  324 mg Oral BID   rosuvastatin  40 mg Oral QHS   senna-docusate  2 tablet Oral QHS   sildenafil  20 mg Oral TID   sodium chloride flush  3 mL Intravenous Q12H   spironolactone  25 mg Oral Daily   torsemide  20 mg Oral Daily   zinc sulfate  220 mg Oral Daily    Infusions:  sodium chloride     heparin 400 Units/hr (02/06/22 0753)    PRN Medications: sodium chloride, acetaminophen, HYDROmorphone (DILAUDID) injection, ondansetron (ZOFRAN) IV, ondansetron **OR** ondansetron (ZOFRAN) IV, mouth rinse, oxyCODONE, sodium chloride flush   Patient Profile   [name] is a 51 y.o. female who has a history of  morbid obesity, HTN, negative for sleep apnea, Medtroinc ICD, HFrEF, HMIII LVAD, and uterine mass.    Admitted for heparin bridge and robotic hysterectomy   Assessment/Plan:     1. Chronic systolic HF: Nonischemic cardiomyopathy.  She has a Medtronic ICD. Cause for cardiomyopathy is uncertain, initially identified peri-partum (after son's birth), so cannot rule out peri-partum CMP.  On and off, she has had frequent PVCs.   RHC in 8/15 showed normal filling pressures and preserved cardiac output. Echo in 4/22 with EF 25%,  severe LV dilation.  CPX (8/22) with mild-moderate HF limitation.  Echo in 3/23 with EF < 20% with severe dilation, normal RV size with mildly decreased systolic function, moderate-severe functional MR with dilated IVC. 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 Impella 5.5 placed 08/29/21. Patient had HM3 LVAD placed 09/04/21 and speed adjusted by ramp echo up to 5800 rpm. Echo 02/04/22: septal wall bows mildly towards the RV and no significant MR. IVC small. LVAD speed increased to 5900.  - Continue torsemide 20 mg daily with KCl 20 daily.  - Continue spironolactone 25 mg daily.  - She should continue ASA 81 mg daily with history of CVA.  - Continue sildenafil 20 mg tid  - Warfarin for VAD, goal INR 2-2.5 =>Warfarin initially held for surgery. Now post-op. Now on heparin gtt w/ stable hgb. INR 1.1 today  - restart coumadin tonight if ok by GYN  2. 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  She had been on Levoxyl at home prior to 3/23 admission, noted to be hyperthyroid in 3/23.  This likely contributes to her CHF. She had recurrent VT and poorly tolerated AF which made it difficult for [name] to stop amiodarone. Initially treated w/ IV Solumedrol  -> transitioned to prednisone and methimazole.  Endocrinology following, prednisone being titrated down.  She is off amiodarone.  - Continue methimazole 10 mg bid - Continue prednisone 10 mg daily.   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  - Continue quinidine.  - Now off amiodarone.  4. CVA: CVA in 10/21, had thrombectomy with stent placement.  No LV thrombus noted and no atrial fibrillation noted.  - Would continue ASA 81 daily.   5. Anemia: Improved, hgb 10.8 today.  6. CKD stage 3: Stable creatinine at 1.31 today. Follow BMET.    7. Mitral  regurgitation: Moderate-severe functional MR pre-LVAD.  Unlikely to be Mitraclip candidate with severely dilated LV (>7 cm). Hopefully will improve with LVAD.  - No significant MR on echo this admit 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  Unable to get MRI with ICD. Seen by GYN. Felt to be most likely non-neoplastic. Tumor markers within normal range. Possible low grade malignancy, could also be benign.  No ascites noted on abdominal CT and no mention of abdominal spread.  She was seen by OB/gyn, best course thought to be surgical removal of the ovarian mass. S/p total hysterectomy + BSO on 8/16.  - appreciate GYN oncology  9. Atrial fibrillation: Tolerates poorly.   - Watch closely off amiodarone.  - SR currently 10. Heavy menses: In ER 7/23 with anemia in setting of very heavy menses requiring 2 units PRBCs.  Bleeding has resolved with Provera.  - Has had feraheme.  - s/p  total hysterectomy + BSO 11. Leukocytosis: WBC elevated 19K post op. No s/s of systemic infection. AF ? Reactive - monitor closely    I reviewed the LVAD parameters from today, and compared the results to the patient's prior recorded data.  No programming changes were made.  The LVAD is functioning within specified parameters.  The patient performs LVAD self-test daily.  LVAD interrogation was negative for any significant power changes, alarms or PI events/speed drops.  LVAD equipment check completed and is in good working order.  Back-up equipment present.   LVAD education done on emergency procedures and precautions and reviewed exit site care.  Length of Stay: [address] Ladoris Gene 02/06/2022, 8:59 AM  VAD Team --- VAD ISSUES ONLY--- Pager [phone] (7am - 7am)  Advanced Heart Failure Team  Pager [phone] (M-F; 7a - 5p)  Please contact [facility] for night-coverage after hours (5p -7a ) and weekends on [email or web address]  Patient seen  with PA, agree with the above note.   Doing well post-op, not dyspnea.  Has walked today.  No flatus/BM yet.   General: Well appearing this am. NAD.  HEENT: Normal. Neck: Supple, JVP 7-8 cm. Carotids OK.  Cardiac:  Mechanical heart sounds with LVAD hum present.  Lungs:  CTAB, normal effort.  Abdomen:  NT, ND, no HSM. No bruits or masses. +BS  LVAD exit site: Well-healed and incorporated. Dressing dry and intact. No erythema or drainage. Stabilization device present and accurately applied. Driveline dressing changed daily per sterile technique. Extremities:  Warm and dry. No cyanosis, clubbing, rash, or edema.  Neuro:  Alert & oriented x 3. Cranial nerves grossly intact. Moves all 4 extremities w/o difficulty. Affect pleasant    Stable post-op.  Encourage ambulation.  Foley out today.  LVAD parameters stable.   On heparin gtt, ok to start warfarin today.   [name] 02/06/2022 10:38 AM

## 2022-02-07 DIAGNOSIS — Z95811 Presence of heart assist device: Secondary | ICD-10-CM | POA: Diagnosis not present

## 2022-02-07 DIAGNOSIS — N9489 Other specified conditions associated with female genital organs and menstrual cycle: Secondary | ICD-10-CM | POA: Diagnosis not present

## 2022-02-07 LAB — CBC
HCT: 32.2 % — ABNORMAL LOW (ref 36.0–46.0)
Hemoglobin: 10.2 g/dL — ABNORMAL LOW (ref 12.0–15.0)
MCH: 26.9 pg (ref 26.0–34.0)
MCHC: 31.7 g/dL (ref 30.0–36.0)
MCV: 85 fL (ref 80.0–100.0)
Platelets: 223 10*3/uL (ref 150–400)
RBC: 3.79 MIL/uL — ABNORMAL LOW (ref 3.87–5.11)
RDW: 15.6 % — ABNORMAL HIGH (ref 11.5–15.5)
WBC: 10 10*3/uL (ref 4.0–10.5)
nRBC: 0 % (ref 0.0–0.2)

## 2022-02-07 LAB — BASIC METABOLIC PANEL
Anion gap: 6 (ref 5–15)
BUN: 15 mg/dL (ref 6–20)
CO2: 25 mmol/L (ref 22–32)
Calcium: 8.5 mg/dL — ABNORMAL LOW (ref 8.9–10.3)
Chloride: 105 mmol/L (ref 98–111)
Creatinine, Ser: 1.28 mg/dL — ABNORMAL HIGH (ref 0.44–1.00)
GFR, Estimated: 51 mL/min — ABNORMAL LOW (ref >60)
Glucose, Bld: 86 mg/dL (ref 70–99)
Potassium: 3.5 mmol/L (ref 3.5–5.1)
Sodium: 136 mmol/L (ref 135–145)

## 2022-02-07 LAB — PROTIME-INR
INR: 1.2 (ref 0.8–1.2)
Prothrombin Time: 14.8 seconds (ref 11.4–15.2)

## 2022-02-07 LAB — HEPARIN LEVEL (UNFRACTIONATED): Heparin Unfractionated: <0.1 IU/mL — ABNORMAL LOW (ref 0.30–0.70)

## 2022-02-07 LAB — LACTATE DEHYDROGENASE: LDH: 166 U/L (ref 98–192)

## 2022-02-07 MED ORDER — WARFARIN SODIUM 5 MG PO TABS
5.0000 mg | ORAL_TABLET | Freq: Once | ORAL | Status: AC
  Administered 2022-02-07: 5 mg via ORAL
  Filled 2022-02-07: qty 1

## 2022-02-07 NOTE — TOC CM/SW Note (Signed)
HF TOC CM spoke to pt at bedside. Will request meds come up from [place] at dc. Scheduled PCP appt, [facility] in [place] on 03/13/2022 at 48 am. [place], Heart Failure TOC CM [phone]

## 2022-02-07 NOTE — Progress Notes (Signed)
2 Days Post-Op Procedure(s) (LRB): XI ROBOTIC ASSISTED TOTAL HYSTERECTOMY WITH BILATERAL SALPINGO OOPHORECTOMY, CYSTOSCOPY, UTERUS GREATER THAN 250 GRAMS (N/A)  Subjective: Patient reports doing well.  Soreness, denies significant abdominal pain.  Ambulated twice in the hall yesterday.  Denies dizziness or lightheadedness.  Voiding without difficulty since Foley catheter removed. + flatus.  Endorses scant vaginal spotting.  Objective: Vital signs in last 24 hours: Temp:  [98 F (36.7 C)-98.3 F (36.8 C)] 98.1 F (36.7 C) (08/18 0352) Pulse Rate:  [76-87] 76 (08/18 0352) Resp:  [13-20] 16 (08/18 0352) BP: (80-102)/(48-68) 87/64 (08/18 0352) SpO2:  [96 %-99 %] 99 % (08/18 0352) Arterial Line BP: (99)/(83) 99/83 (08/17 0753) Weight:  [232 lb 5.8 oz (105.4 kg)] 232 lb 5.8 oz (105.4 kg) (08/18 0352) Last BM Date : 02/03/01  Intake/Output from previous day: 08/17 0701 - 08/18 0700 In: 699.7 [P.O.:600; I.V.:99.7] Out: 1300 [Urine:1300]  Physical Examination: General: No acute distress, alert and oriented HEENT: Normocephalic, atraumatic Cardiovascular: Mechanical hum present from LVAD. Pulmonary: Lungs clear to auscultation bilaterally, no wheezes or rhonchi. Abdomen: Soft, nondistended, appropriately tender to palpation.  Incisions are clean, dry, intact.  Peri-incisional bruising noted.  Full active bowel sounds. Extremities: Warm and well perfused, no edema.  Labs:    Latest Ref Rng & Units 02/07/2022    5:20 AM 02/06/2022    4:30 AM 02/05/2022    1:05 PM  CBC  WBC 4.0 - 10.5 K/uL 10.0  19.5    Hemoglobin 12.0 - 15.0 g/dL 10.2  10.8  10.9   Hematocrit 36.0 - 46.0 % 32.2  35.1  32.0   Platelets 150 - 400 K/uL 223  292        Latest Ref Rng & Units 02/07/2022    5:20 AM 02/06/2022    4:30 AM 02/05/2022    1:05 PM  BMP  Glucose 70 - 99 mg/dL 86  110    BUN 6 - 20 mg/dL 15  14    Creatinine 0.44 - 1.00 mg/dL 1.28  1.31    Sodium 135 - 145 mmol/L 136  138  138   Potassium 3.5  - 5.1 mmol/L 3.5  4.8  3.9   Chloride 98 - 111 mmol/L 105  106    CO2 22 - 32 mmol/L 25  26    Calcium 8.9 - 10.3 mg/dL 8.5  9.2     Assessment:  51 y.o. s/p Procedure(s): XI ROBOTIC ASSISTED TOTAL HYSTERECTOMY WITH BILATERAL SALPINGO OOPHORECTOMY, CYSTOSCOPY, UTERUS GREATER THAN 250 GRAMS: Doing well, meeting milestones.  Postop: Patient is meeting milestones.  Pain well controlled.  Tolerating regular diet.  Has return of bowel function.  Encourage continued ambulation and being up and out of bed.  Leukocytosis: Resolved, reactive in the setting of surgery and perioperative steroids.  Acute on chronic anemia: Hemoglobin 10.2 from 10.8 yesterday.  Preoperative hemoglobin was 10.9.  Dropped to 10.2 is expected in the setting of blood loss from surgery.  No concerns at this time for intra-abdominal bleeding.  Anticoagulation: Patient continues on a heparin drip, Coumadin started last night.  Defer discharge planning to LVAD team.  Plan: Dispo: Per LVAD team.  From a postoperative standpoint, patient can be discharged at any time. The patient is to be discharged to home.   LOS: 4 days    Lafonda Mosses 02/07/2022, 6:40 AM

## 2022-02-07 NOTE — Progress Notes (Signed)
ANTICOAGULATION CONSULT NOTE - Follow Up Consult  Pharmacy Consult for heparin>>warfarin Indication:  LVAD  No Known Allergies  Patient Measurements: Height: '5\' 3"'$  (160 cm) Weight: 105.4 kg (232 lb 5.8 oz) IBW/kg (Calculated) : 52.4 Heparin Dosing Weight: 78kg  Vital Signs: Temp: 98.1 F (36.7 C) (08/18 1217) Temp Source: Oral (08/18 1217) BP: 87/64 (08/18 0352) Pulse Rate: 75 (08/18 1217)  Labs: Recent Labs    02/05/22 0030 02/05/22 1305 02/06/22 0430 02/07/22 0520  HGB 10.2* 10.9* 10.8* 10.2*  HCT 32.6* 32.0* 35.1* 32.2*  PLT 227  --  292 223  LABPROT 14.3  --  14.3 14.8  INR 1.1  --  1.1 1.2  HEPARINUNFRC <0.10*  --  <0.10* <0.10*  CREATININE 1.22*  --  1.31* 1.28*     Estimated Creatinine Clearance: 61.1 mL/min (A) (by C-G formula based on SCr of 1.28 mg/dL (H)).   Medical History: Past Medical History:  Diagnosis Date   Automatic implantable cardioverter-defibrillator in situ    Chronic CHF (congestive heart failure) ([place])    a. EF 15-20% b. RHC (09/2013) RA 14, RV 57/22, PA 64/36 (48), PCWP 18, FIck CO/CI 3.7/1.6, PVR 8.1 WU, PA sat 47%    History of hypothyroidism    History of stomach ulcers    Hyperthyroidism    Hypotension    Morbid obesity ([place])    Myocardial infarction ([place]) 08/2013   Nocturnal dyspnea    Nonischemic cardiomyopathy ([place])    Sinus tachycardia    Sleep apnea    Snoring-prob OSA 09/04/2011   Sprint Fidelis ICD lead RECALL  6949    Stroke ([place]) 2021   some residual right-sided weakness   UARS (upper airway resistance syndrome) 09/04/2011   HST 12/2013:  AHI 4/hr (numerous episodes of airflow reduction that did not have concomitant desaturation)    Assessment: 51 year old female with LVAD implanted earlier this year. Patient being admitted for total hysterectomy on 8/16. Last dose of warfarin was 8/10, lovenox bridge was started with last dose given 8/13pm.   Patient s/p hysterectomy 8/16. Low dose heparin resumed 8/16. INR  trending up to 1.2. Off warfarin for a week.   Prior to admission warfarin dose was '2mg'$  TuTrSa and '4mg'$  all other days.   Goal of Therapy:  INR goal 2-2.5 Heparin level goal <0.3 Monitor platelets by anticoagulation protocol: Yes   Plan:  Continue heparin infusion at 400 units/hr  Warfarin '5mg'$  again today  Of note patient states she has a large supply of Lovenox injections at home if cardiology feels she is stable to leave on a bridge over the weekend.  Erin Hearing PharmD., BCPS Clinical Pharmacist 02/07/2022 12:40 PM

## 2022-02-07 NOTE — Progress Notes (Addendum)
Advanced Heart Failure VAD Team Note  PCP-Cardiologist: [name], MD   Subjective:    S/p laparoscopic total hysterectomy with bilateral salpingoophorectomy VAD speed increased to 5900 after echo 8/15  POD #3  On heparin/ warfarin. INR 1.2  Diuretics held yesterday for low MAPs. MAPs 70-80 today   Feels good. Walking floor   LVAD INTERROGATION:  HeartMate III LVAD:   Flow 4.8 liters/min, speed 5900, power 5.0, PI 3.1.  VAD interrogated personally. Parameters stable.  Objective:    Vital Signs:   Temp:  [98 F (36.7 C)-98.3 F (36.8 C)] 98.1 F (36.7 C) (08/18 0352) Pulse Rate:  [76-87] 76 (08/18 0352) Resp:  [13-20] 16 (08/18 0352) BP: (80-102)/(48-68) 87/64 (08/18 0352) SpO2:  [96 %-99 %] 99 % (08/18 0352) Arterial Line BP: (99)/(83) 99/83 (08/17 0753) Weight:  [105.4 kg] 105.4 kg (08/18 0352) Last BM Date : 02/03/01 Mean arterial Pressure 70-80s   Intake/Output:   Intake/Output Summary (Last 24 hours) at 02/07/2022 0740 Last data filed at 02/07/2022 0400 Gross per 24 hour  Intake 699.73 ml  Output 1300 ml  Net -600.27 ml     Physical Exam    General:  NAD.  HEENT: normal  Neck: supple. JVP not elevated.  Carotids 2+ bilat; no bruits. No lymphadenopathy or thryomegaly appreciated. Cor: LVAD hum.  Lungs: Clear. Abdomen: obese soft, nontender, non-distended. No hepatosplenomegaly. No bruits or masses. Good bowel sounds. Driveline site clean. Anchor in place.  Extremities: no cyanosis, clubbing, rash. Warm no edema  Neuro: alert & oriented x 3. No focal deficits. Moves all 4 without problem    Telemetry   SR 70-80s Personally reviewed  Labs   Basic Metabolic Panel: Recent Labs  Lab 02/04/22 0029 02/04/22 2117 02/05/22 0030 02/05/22 1305 02/06/22 0430 02/07/22 0520  NA 138 138 138 138 138 136  K 4.1 4.2 4.1 3.9 4.8 3.5  CL 108 108 107  --  106 105  CO2 '24 23 25  '$ --  26 25  GLUCOSE 97 103* 96  --  110* 86  BUN '16 16 15  '$ --  14 15   CREATININE 1.41* 1.25* 1.22*  --  1.31* 1.28*  CALCIUM 8.9 9.0 8.8*  --  9.2 8.5*  MG 1.9  --   --   --   --   --     Liver Function Tests: Recent Labs  Lab 02/03/22 1049 02/04/22 2117  AST 17 17  ALT 13 12  ALKPHOS 61 56  BILITOT 0.5 0.5  PROT 6.5 6.1*  ALBUMIN 3.7 3.5   No results for input(s): "LIPASE", "AMYLASE" in the last 168 hours. No results for input(s): "AMMONIA" in the last 168 hours.  CBC: Recent Labs  Lab 02/03/22 1049 02/04/22 0029 02/04/22 2117 02/05/22 0030 02/05/22 1305 02/06/22 0430 02/07/22 0520  WBC 5.0 6.5 7.1 7.1  --  19.5* 10.0  NEUTROABS 3.4  --   --   --   --   --   --   HGB 11.1* 10.4* 10.7* 10.2* 10.9* 10.8* 10.2*  HCT 35.3* 32.9* 34.0* 32.6* 32.0* 35.1* 32.2*  MCV 84.0 83.9 83.7 84.7  --  84.4 85.0  PLT 280 386 253 227  --  292 223    INR: Recent Labs  Lab 02/03/22 1049 02/04/22 0029 02/05/22 0030 02/06/22 0430 02/07/22 0520  INR 1.2 1.2 1.1 1.1 1.2      Imaging   DG CHEST PORT 1 VIEW  Result Date: 02/05/2022 CLINICAL DATA:  Preop chest exam, right central line EXAM: PORTABLE CHEST 1 VIEW COMPARISON:  Radiograph 09/23/2021 FINDINGS: Unchanged cardiomegaly with prior median sternotomy, AICD leads, and LVAD. There is a right neck approach central venous catheter tip overlying the right atrium. There is no focal airspace consolidation. No pleural effusion. No pneumothorax. No acute osseous abnormality. Mild gaseous distension of bowel in the upper abdomen. IMPRESSION: Right neck central venous catheter tip overlies the right atrium. No focal airspace disease.  Unchanged cardiomegaly. Electronically Signed   By: Maurine Simmering M.D.   On: 02/05/2022 15:45     Medications:     Scheduled Medications:  aspirin EC  81 mg Oral Daily   Chlorhexidine Gluconate Cloth  6 each Topical Daily   methimazole  10 mg Oral BID   potassium chloride  20 mEq Oral Daily   predniSONE  10 mg Oral Q breakfast   quiniDINE gluconate  324 mg Oral BID    rosuvastatin  40 mg Oral QHS   senna-docusate  2 tablet Oral QHS   sildenafil  20 mg Oral TID   sodium chloride flush  3 mL Intravenous Q12H   spironolactone  25 mg Oral Daily   torsemide  20 mg Oral Daily   Warfarin - Pharmacist Dosing Inpatient   Does not apply q1600   zinc sulfate  220 mg Oral Daily    Infusions:  sodium chloride     heparin 400 Units/hr (02/07/22 0400)    PRN Medications: sodium chloride, acetaminophen, HYDROmorphone (DILAUDID) injection, ondansetron (ZOFRAN) IV, ondansetron **OR** ondansetron (ZOFRAN) IV, mouth rinse, oxyCODONE, sodium chloride flush   Patient Profile   [name] is a 51 y.o. female who has a history of morbid obesity, HTN, negative for sleep apnea, Medtroinc ICD, HFrEF, HMIII LVAD, and uterine mass.    Admitted for heparin bridge and robotic hysterectomy   Assessment/Plan:     1. Chronic systolic HF: Nonischemic cardiomyopathy.  She has a Medtronic ICD. Cause for cardiomyopathy is uncertain, initially identified peri-partum (after son's birth), so cannot rule out peri-partum CMP.  On and off, she has had frequent PVCs.   RHC in 8/15 showed normal filling pressures and preserved cardiac output. Echo in 4/22 with EF 25%, severe LV dilation.  CPX (8/22) with mild-moderate HF limitation.  Echo in 3/23 with EF < 20% with severe dilation, normal RV size with mildly decreased systolic function, moderate-severe functional MR with dilated IVC. 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 Impella 5.5 placed 08/29/21. Patient had HM3 LVAD placed 09/04/21 and speed adjusted by ramp echo up to 5800 rpm. Echo 02/04/22: septal wall bows mildly towards the RV and no significant MR. IVC small. LVAD speed increased to 5900.  - Diuretics held yesterday for low MAPs  - Continue KCL, K 3.8 this am. - She should continue ASA 81 mg  daily with history of CVA.  - Continue sildenafil 20 mg tid. May need to hold if MAPs remain low. - Warfarin for VAD, goal INR 2-2.5 =>Warfarin initially held for surgery. Now post-op. Now on heparin gtt w/ stable hgb. Warfarin restarted 08/17. INR unchanged at 1.2 today. Discussed dosing with PharmD personally. Can go home on lovenox when stable for d/c if needed. 2. 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  She had been on Levoxyl at home prior to 3/23 admission, noted to be  hyperthyroid in 3/23.  This likely contributes to her CHF. She had recurrent VT and poorly tolerated AF which made it difficult for [name] to stop amiodarone. Initially treated w/ IV Solumedrol  -> transitioned to prednisone and methimazole.  Endocrinology following, prednisone being titrated down.  She is off amiodarone.  - Continue methimazole 10 mg bid - Continue prednisone 10 mg daily.   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  - Continue quinidine.  - Now off amiodarone.  4. CVA: CVA in 10/21, had thrombectomy with stent placement.  No LV thrombus noted and no atrial fibrillation noted.  - Would continue ASA 81 daily.   5. Anemia: Improved, hgb 10.2 today.  6. CKD stage 3: Stable creatinine at 1.3 today. Follow BMET.    7. Mitral regurgitation: Moderate-severe functional MR pre-LVAD.  Unlikely to be Mitraclip candidate with severely dilated LV (>7 cm). Hopefully will improve with LVAD.  - No significant MR on echo this admit 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  Unable to get MRI with ICD. Seen by GYN. Felt to be most likely non-neoplastic. Tumor markers within normal range. Possible low grade malignancy, could also be benign.  No ascites noted on abdominal CT and no mention of abdominal spread.  She was seen by OB/gyn, best course thought to be surgical removal of the  ovarian mass. S/p total hysterectomy + BSO on 8/16.  - appreciate GYN oncology  9. Atrial fibrillation: Tolerates poorly.   - Watch closely off amiodarone.  - SR currently 10. Heavy menses: In ER 7/23 with anemia in setting of very heavy menses requiring 2 units PRBCs.  Bleeding has resolved with Provera.  - Has had feraheme.  - s/p total hysterectomy + BSO 11. Leukocytosis: WBC elevated 19K post op.  - resolved  She looks good and is eager to go home. Knows how to use lovenox. Will d/c today. Would hold torsemide until needed for edema.    I reviewed the LVAD parameters from today, and compared the results to the patient's prior recorded data.  No programming changes were made.  The LVAD is functioning within specified parameters.  The patient performs LVAD self-test daily.  LVAD interrogation was negative for any significant power changes, alarms or PI events/speed drops.  LVAD equipment check completed and is in good working order.  Back-up equipment present.   LVAD education done on emergency procedures and precautions and reviewed exit site care.  Length of Stay: 4  Glori Bickers, MD  1:18 PM   VAD Team --- VAD ISSUES ONLY--- Pager [phone] (7am - 7am)  Advanced Heart Failure Team  Pager [phone] (M-F; 7a - 5p)  Please contact [facility] for night-coverage after hours (5p -7a ) and weekends on [email or web address]

## 2022-02-07 NOTE — Progress Notes (Signed)
LVAD Coordinator Rounding Note:  Planned admission 02/03/22 to [facility] for heparin bridge per Dr [name]. Pt posted for total hysterectomy.  S/p laparoscopic total hysterectomy with bilateral salpingoophorectomy- 02/05/22  Patient sitting up on side of the bed. Denies complaints other than mild nausea this morning. Patient states she has minimal vaginal bleeding since procedure.    Increased # of PI events noted on interrogation. Encouraged pt to increase PO intake. She verbalized understanding. Holding diuretics today per Dr [name].   Vital signs: Temp: 98.1 HR: 75 Doppler: 73 Auto BP: 87/64 (73) O2 Sat: 94% on RA  Wt:  237.4>235.4>236>235.4>232.3 lbs  LVAD interrogation:  Speed: 5900 Flow: 5.5 Power: 4.7w PI: 2.6 Hct: 20-DO NOT ADJUST  Alarms: none Events: 100+ so far today   Fixed speed: 5900 Low speed limit: 5600  Drive Line: CDI. Drive line anchor secure. Weekly dressing changes by bedside nurse. Next dressing change due: 02/10/22.    Labs:  LDH trend:  207>182>181>166  INR trend:  1.2>1.1>1.1>1.2  Hgb: 11.1>10.2>10.8>10.2   Anticoagulation Plan: -INR Goal: 2.0 - 2.5 -ASA Dose: 81 mg  -Heparin gtt 400 u/hr  Device: Medtronic single ICD - Therapies: on 200 bpm - Pacing:  VVI 40 -  Last check 11/26/21  Drips:  Heparin 400 units/hr          Plan/Recommendations:  1. Page VAD coordinators with VAD equipment and drive line concerns. 2. Weekly dressing change per VAD coordinator or caregiver. Next dressing change due 02/11/22.  [name] RN [place] Coordinator  Office: [phone]  24/7 Pager: [phone]    [name] RN [place] Coordinator  Office: [phone]  24/7 Pager: [phone]

## 2022-02-07 NOTE — Discharge Summary (Signed)
Advanced Heart Failure Team  Discharge Summary   Patient ID: [name] MRN: [id number], DOB/AGE: 1970-12-03 51 y.o. Admit date: 02/03/2022 D/C date:     02/09/2022   Primary Discharge Diagnoses:  Complex Left Adnexal Mass w/ AUB, s/p laparotomy and laparoscopic total hysterectomy and BSO  Secondary Discharge Diagnoses:  Chronic Systolic Heart Failure, s/p HMIII LVAD Chronic anticoagulation therapy w/ coumadin  Acute on Chronic Anemia  Stage III CKD H/o VT s/p ICD PAF Hyperthyroidism   Hospital Course:   [name] is a 51 y.o. female who has a history of chronic systolic HF s/p HMIII LVAD, s/p ICD, HTN, mitral regurgitation, PAF, h/o PVCs/VT, hyperthyroidism, Stage III CKD, h/o CVA and recently found to have a left adnexal mas and abnormal vaginal bleeding, requiring blood transfusions and treatment w/ Provera. Tumor markers obtained on 08/26/2021 were notable for a normal CEA of 1.1 and CA-125 of 17.3. Evaluated by Gynecology and robotic hysterectomy with bilateral salpingoophorectomy recommended.    Admitted 8/14 for heparin bridge. Coumadin held. On 8/16, underwent successful hysterectomy with bilateral salpingoophorectomy. Tolerated well. No immediate complications. Once cleared by GYN, was restarted on coumadin w/ heparin gtt. H/H remained stable   On 8/18 MAPs were low. Torsemide held.   On 8/19 was feeling well MAPs back to 70-80 range. Still with multiple PI events but no low flows. Eating/drinking well. Ambulating halls. Was eager to go home INR 1.1 on heparin/warfarin  No bleeding   She has used lovenox in past and was comfortable giving herself the injections.   [place] for d/c today with Lovenox. Hold torsemide for now. Can resume as needed.  __________________________________  We will restart spironolactone on Monday, 02/10/2022. Per Pharm.D., will take 4 mg of Coumadin 02/09/2022 and 02/10/2022.  She will check her INR on Tuesday, 02/11/2022 and call the LVAD clinic. She  understands to take Lovenox twice daily until therapeutic.  Prednisone and methimazole dosing recently reduced.  We will continue these at present doses.  Upon further discussion with [name], we will discontinue her progesterone.  [name] requests guidance regarding estrogen hormone replacement therapy now that she will be in medically induced menopause.  She suspects that she may start having symptoms in the next 2 to 3 weeks.  Given that she is fully anticoagulated with Coumadin, the OB thinks that low-dose estrogen use could be safe for her.  HRT will only be started if the patient becomes symptomatic.  [name] requests that our team weigh in on this decision.  - A. Duke PAC  _______________________________________     Discharge Weight Range:  Discharge Vitals: Blood pressure (!) 88/62, pulse 76, temperature 98.1 F (36.7 C), temperature source Oral, resp. rate 15, height '5\' 3"'$  (1.6 m), weight 105.8 kg, SpO2 93 %. Doppler 82  General:  NAD.  HEENT: normal  Neck: supple. JVP not elevated.  Carotids 2+ bilat; no bruits. No lymphadenopathy or thryomegaly appreciated. Cor: LVAD hum.  Lungs: Clear. Abdomen: soft, nontender, non-distended. No hepatosplenomegaly. No bruits or masses. Good bowel sounds. Driveline site clean. Anchor in place.  Extremities: no cyanosis, clubbing, rash. Warm no edema  Neuro: alert & oriented x 3. No focal deficits. Moves all 4 without problem    Labs: Lab Results  Component Value Date   WBC 10.7 (H) 02/08/2022   HGB 11.4 (L) 02/08/2022   HCT 37.1 02/08/2022   MCV 85.1 02/08/2022   PLT 293 02/08/2022    Recent Labs  Lab 02/04/22 2117 02/05/22  0030 02/08/22 0555  NA 138   < > 138  K 4.2   < > 3.8  CL 108   < > 105  CO2 23   < > 23  BUN 16   < > 9  CREATININE 1.25*   < > 1.15*  CALCIUM 9.0   < > 8.9  PROT 6.1*  --   --   BILITOT 0.5  --   --   ALKPHOS 56  --   --   ALT 12  --   --   AST 17  --   --   GLUCOSE 103*   < > 98   < > =  values in this interval not displayed.   Lab Results  Component Value Date   CHOL 100 08/26/2021   HDL 26 (L) 08/26/2021   LDLCALC 55 08/26/2021   TRIG 95 08/26/2021   BNP (last 3 results) Recent Labs    09/04/21 0349 09/10/21 0039 09/17/21 0140  BNP 358.8* 521.3* 279.4*    ProBNP (last 3 results) No results for input(s): "PROBNP" in the last 8760 hours.   Diagnostic Studies/Procedures   No results found.  Discharge Medications   Allergies as of 02/08/2022   No Known Allergies      Medication List     STOP taking these medications    medroxyPROGESTERone 10 MG tablet Commonly known as: Provera       TAKE these medications    aspirin EC 81 MG tablet Take 1 tablet (81 mg total) by mouth daily. Swallow whole.   enoxaparin 40 MG/0.4ML injection Commonly known as: LOVENOX Inject 0.4 mLs (40 mg total) into the skin every 12 (twelve) hours.   Eszopiclone 3 MG Tabs Take 1 tablet (3 mg total) by mouth at bedtime. Take immediately before bedtime   methimazole 10 MG tablet Commonly known as: TAPAZOLE Take 1 tablet (10 mg total) by mouth 2 (two) times daily.   potassium chloride SA 20 MEQ tablet Commonly known as: KLOR-CON M Take 1 tablet (20 mEq total) by mouth daily. What changed:  how much to take when to take this   predniSONE 10 MG tablet Commonly known as: DELTASONE Take 1 tablet (10 mg total) by mouth daily with breakfast.   quiniDINE gluconate 324 MG CR tablet Take 1 tablet (324 mg total) by mouth 2 (two) times daily.   rosuvastatin 40 MG tablet Commonly known as: CRESTOR Take 1 tablet (40 mg total) by mouth at bedtime.   senna-docusate 8.6-50 MG tablet Commonly known as: Senokot-S Take 2 tablets by mouth at bedtime. For AFTER surgery, do not take if having diarrhea   sildenafil 20 MG tablet Commonly known as: REVATIO Take 1 tablet (20 mg total) by mouth 3 (three) times daily.   spironolactone 25 MG tablet Commonly known as:  ALDACTONE Take 1 tablet (25 mg total) by mouth daily. Restart on 02/10/22. Start taking on: February 10, 2022 What changed: additional instructions   torsemide 20 MG tablet Commonly known as: DEMADEX Take 1 tablet (20 mg total) by mouth daily as needed (for shortness of breath or weight gain of 3 lbs overnight). What changed:  when to take this reasons to take this   traMADol 50 MG tablet Commonly known as: ULTRAM Take 1 tablet (50 mg total) by mouth every 6 (six) hours as needed for severe pain. For AFTER surgery only, do not take and drive   warfarin 2 MG tablet Commonly known as: COUMADIN Take as  directed. If you are unsure how to take this medication, talk to your nurse or doctor. Original instructions: Take 4 mg 02/09/22 and 02/10/22. Check INR on Tues and call LVAD clinic. What changed: additional instructions   zinc gluconate 50 MG tablet Take 1 tablet (50 mg total) by mouth daily.        Disposition   The patient will be discharged in stable condition to home. Discharge Instructions     Diet - low sodium heart healthy   Complete by: As directed    Discharge instructions   Complete by: As directed    Check INR on Tues and call LVAD clinic.   Start spironolactone on Monday 02/10/22.  Use torsemide PRN, potassium now 20 mEq daily.  Stop progesterone.   Increase activity slowly   Complete by: As directed        Follow-up Information     Lafonda Mosses, MD Follow up on 02/12/2022.   Specialty: Gynecologic Oncology Why: at 4:40 pm will be a PHONE visit with [name] to check in and discuss pathology. IN PERSON visit will be on 03/13/22 at 1:45pm at the [facility]. Contact information: [place] [address] [phone]         [facility] Follow up.   Specialty: Family Medicine Why: new patient appt for Primary Care-March 13, 2022 at 11 am, if you are unable to keep appt. please call to reschedule or  cancel Contact information: [place] [address] [place] [address] [phone]                  Duration of Discharge Encounter: Greater than 35 minutes   Signed, Glori Bickers, MD  4:10 PM

## 2022-02-08 DIAGNOSIS — N9489 Other specified conditions associated with female genital organs and menstrual cycle: Secondary | ICD-10-CM | POA: Diagnosis not present

## 2022-02-08 LAB — BASIC METABOLIC PANEL
Anion gap: 10 (ref 5–15)
BUN: 9 mg/dL (ref 6–20)
CO2: 23 mmol/L (ref 22–32)
Calcium: 8.9 mg/dL (ref 8.9–10.3)
Chloride: 105 mmol/L (ref 98–111)
Creatinine, Ser: 1.15 mg/dL — ABNORMAL HIGH (ref 0.44–1.00)
GFR, Estimated: 58 mL/min — ABNORMAL LOW (ref >60)
Glucose, Bld: 98 mg/dL (ref 70–99)
Potassium: 3.8 mmol/L (ref 3.5–5.1)
Sodium: 138 mmol/L (ref 135–145)

## 2022-02-08 LAB — LACTATE DEHYDROGENASE: LDH: 191 U/L (ref 98–192)

## 2022-02-08 LAB — CBC
HCT: 37.1 % (ref 36.0–46.0)
Hemoglobin: 11.4 g/dL — ABNORMAL LOW (ref 12.0–15.0)
MCH: 26.1 pg (ref 26.0–34.0)
MCHC: 30.7 g/dL (ref 30.0–36.0)
MCV: 85.1 fL (ref 80.0–100.0)
Platelets: 293 10*3/uL (ref 150–400)
RBC: 4.36 MIL/uL (ref 3.87–5.11)
RDW: 15.5 % (ref 11.5–15.5)
WBC: 10.7 10*3/uL — ABNORMAL HIGH (ref 4.0–10.5)
nRBC: 0 % (ref 0.0–0.2)

## 2022-02-08 LAB — HEPARIN LEVEL (UNFRACTIONATED): Heparin Unfractionated: <0.1 IU/mL — ABNORMAL LOW (ref 0.30–0.70)

## 2022-02-08 LAB — PROTIME-INR
INR: 1.2 (ref 0.8–1.2)
Prothrombin Time: 15.1 seconds (ref 11.4–15.2)

## 2022-02-08 MED ORDER — WARFARIN SODIUM 2 MG PO TABS: ORAL_TABLET | ORAL | 5 refills | Status: DC

## 2022-02-08 MED ORDER — TORSEMIDE 20 MG PO TABS: 20.0000 mg | ORAL_TABLET | Freq: Every day | ORAL | 3 refills | Status: DC | PRN

## 2022-02-08 MED ORDER — SPIRONOLACTONE 25 MG PO TABS: 25.0000 mg | ORAL_TABLET | Freq: Every day | ORAL | 3 refills | Status: DC

## 2022-02-08 MED ORDER — WARFARIN SODIUM 7.5 MG PO TABS
7.5000 mg | ORAL_TABLET | ORAL | Status: AC
  Administered 2022-02-08: 7.5 mg via ORAL
  Filled 2022-02-08: qty 1

## 2022-02-08 MED ORDER — ENOXAPARIN SODIUM 40 MG/0.4ML IJ SOSY: 40.0000 mg | PREFILLED_SYRINGE | Freq: Two times a day (BID) | INTRAMUSCULAR | 0 refills | Status: DC

## 2022-02-08 MED ORDER — ENOXAPARIN SODIUM 40 MG/0.4ML IJ SOSY
40.0000 mg | PREFILLED_SYRINGE | Freq: Once | INTRAMUSCULAR | Status: AC
Start: 2022-02-08 — End: 2022-02-08
  Administered 2022-02-08: 40 mg via SUBCUTANEOUS
  Filled 2022-02-08: qty 0.4

## 2022-02-08 MED ORDER — POTASSIUM CHLORIDE CRYS ER 20 MEQ PO TBCR: 20.0000 meq | EXTENDED_RELEASE_TABLET | Freq: Every day | ORAL | 11 refills | Status: DC

## 2022-02-08 MED ORDER — WARFARIN SODIUM 7.5 MG PO TABS: 7.5000 mg | ORAL_TABLET | Freq: Once | ORAL | Status: DC

## 2022-02-08 NOTE — Plan of Care (Signed)

## 2022-02-08 NOTE — Progress Notes (Addendum)
ANTICOAGULATION CONSULT NOTE - Follow Up Consult  Pharmacy Consult for heparin>>warfarin Indication:  LVAD  No Known Allergies  Patient Measurements: Height: '5\' 3"'$  (160 cm) Weight: 105.8 kg (233 lb 4 oz) IBW/kg (Calculated) : 52.4 Heparin Dosing Weight: 78kg  Vital Signs: Temp: 98 F (36.7 C) (08/19 1113) Temp Source: Oral (08/19 1113) BP: 89/57 (08/19 1113) Pulse Rate: 82 (08/19 1113)  Labs: Recent Labs    02/06/22 0430 02/07/22 0520 02/08/22 0555  HGB 10.8* 10.2* 11.4*  HCT 35.1* 32.2* 37.1  PLT 292 223 293  LABPROT 14.3 14.8 15.1  INR 1.1 1.2 1.2  HEPARINUNFRC <0.10* <0.10* <0.10*  CREATININE 1.31* 1.28* 1.15*     Estimated Creatinine Clearance: 68.2 mL/min (A) (by C-G formula based on SCr of 1.15 mg/dL (H)).   Medical History: Past Medical History:  Diagnosis Date   Automatic implantable cardioverter-defibrillator in situ    Chronic CHF (congestive heart failure) ([place])    a. EF 15-20% b. RHC (09/2013) RA 14, RV 57/22, PA 64/36 (48), PCWP 18, FIck CO/CI 3.7/1.6, PVR 8.1 WU, PA sat 47%    History of hypothyroidism    History of stomach ulcers    Hyperthyroidism    Hypotension    Morbid obesity ([place])    Myocardial infarction ([place]) 08/2013   Nocturnal dyspnea    Nonischemic cardiomyopathy ([place])    Sinus tachycardia    Sleep apnea    Snoring-prob OSA 09/04/2011   Sprint Fidelis ICD lead RECALL  6949    Stroke ([place]) 2021   some residual right-sided weakness   UARS (upper airway resistance syndrome) 09/04/2011   HST 12/2013:  AHI 4/hr (numerous episodes of airflow reduction that did not have concomitant desaturation)    Assessment: [name] with LVAD implanted earlier this year. Patient being admitted for total hysterectomy on 8/16. Last dose of warfarin was 8/10, lovenox bridge was started with last dose given 8/13pm.   Patient s/p hysterectomy 8/16. Low dose heparin 400 uts/hr  - no titration -resumed 8/16. Heparin level remains undetectable.  INR < goal 1.2. Off warfarin for a week prior to procedure    Prior to admission warfarin dose was '2mg'$  TuTrSa and '4mg'$  all other days.   Goal of Therapy:  INR goal 2-2.5 Heparin level goal <0.3 Monitor platelets by anticoagulation protocol: Yes   Plan: Plan dc home today with enoxaparin '40mg'$  sq q12 - until INR 1.8  She will check INR on Tues  Warfarin 7.'5mg'$  x1  today Then warfarin '4mg'$  Sunday and Monday   [name] Pharm.D. CPP, BCPS Clinical Pharmacist [phone] 02/08/2022 1:31 PM     Of note patient states she has a large supply of Lovenox injections at home if cardiology feels she is stable to leave on a bridge over the weekend.   [name] Pharm.D. CPP, BCPS Clinical Pharmacist [phone] 02/08/2022 12:52 PM

## 2022-02-08 NOTE — Progress Notes (Signed)
3 Days Post-Op Procedure(s) (LRB): XI ROBOTIC ASSISTED TOTAL HYSTERECTOMY WITH BILATERAL SALPINGO OOPHORECTOMY, CYSTOSCOPY, UTERUS GREATER THAN 250 GRAMS (N/A)  Subjective: Patient reports doing well. Denies significant ain. Minimal spotting when she wipes after voiding. + flatus, had bowel movement yesterday. Ambulating without difficulty.  Objective: Vital signs in last 24 hours: Temp:  [98.1 F (36.7 C)-98.3 F (36.8 C)] 98.1 F (36.7 C) (08/19 0511) Pulse Rate:  [75-87] 76 (08/19 0511) Resp:  [16-17] 17 (08/19 0511) BP: (81-98)/(49-67) 81/67 (08/19 0511) SpO2:  [94 %-97 %] 97 % (08/19 0511) Weight:  [233 lb 4 oz (105.8 kg)] 233 lb 4 oz (105.8 kg) (08/19 0511) Last BM Date : 02/03/01  Intake/Output from previous day: 08/18 0701 - 08/19 0700 In: 108 [P.O.:60; I.V.:48] Out: -   Physical Examination: General: No acute distress, alert and oriented HEENT: Normocephalic, atraumatic Cardiovascular: Mechanical hum present from LVAD. Pulmonary: Lungs clear to auscultation bilaterally, no wheezes or rhonchi. Abdomen: Soft, nondistended, appropriately tender to palpation.  Incisions are clean, dry, intact.  Peri-incisional bruising noted.  Full active bowel sounds. Extremities: Warm and well perfused, no edema.  Labs:    Latest Ref Rng & Units 02/08/2022    5:55 AM 02/07/2022    5:20 AM 02/06/2022    4:30 AM  CBC  WBC 4.0 - 10.5 K/uL 10.7  10.0  19.5   Hemoglobin 12.0 - 15.0 g/dL 11.4  10.2  10.8   Hematocrit 36.0 - 46.0 % 37.1  32.2  35.1   Platelets 150 - 400 K/uL 293  223  292       Latest Ref Rng & Units 02/08/2022    5:55 AM 02/07/2022    5:20 AM 02/06/2022    4:30 AM  BMP  Glucose 70 - 99 mg/dL 98  86  110   BUN 6 - 20 mg/dL '9  15  14   '$ Creatinine 0.44 - 1.00 mg/dL 1.15  1.28  1.31   Sodium 135 - 145 mmol/L 138  136  138   Potassium 3.5 - 5.1 mmol/L 3.8  3.5  4.8   Chloride 98 - 111 mmol/L 105  105  106   CO2 22 - 32 mmol/L '23  25  26   '$ Calcium 8.9 - 10.3 mg/dL 8.9   8.5  9.2    Assessment:  51 y.o. s/p Procedure(s): XI ROBOTIC ASSISTED TOTAL HYSTERECTOMY WITH BILATERAL SALPINGO OOPHORECTOMY, CYSTOSCOPY, UTERUS GREATER THAN 250 GRAMS: well, meeting milestones.   Meeting postoperative milestones. Continues to be appropriate for discharge from a surgical standpoint. Will monitor symptoms over the next 2-3 weeks given surgical menopause. If symptomatic, would be helpful to know if acceptable per cardiology to be on HRT (would plan on lowest dose estrogen to control symptoms) in the setting of full anticoagulation.    Plan: Dispo: Per LVAD team.  From a postoperative standpoint, patient can be discharged at any time. The patient is to be discharged to home.   LOS: 5 days    [name] 02/08/2022, 6:41 AM

## 2022-02-08 NOTE — TOC Transition Note (Signed)
Transition of Care [facility]) - CM/SW Discharge Note   Patient Details  Name: [name] MRN: [id number] Date of Birth: 03/04/1971  [facility] of [facility]) CM/SW Contact:  Miranda Penta, RN Phone Number: [phone] 02/08/2022, 2:50 PM   Clinical Narrative:   Spoke with [name] along with her nurse at bedside. Discussed [name] is currenlty closed. Inquiring about different pharmacy for medications to be sent to. Nursing confirmed only medication in main pharmacy is quinidine. [facility] pharmacy closed on weekends. [name] states it is no need to have medications sent to another pharmacy. Confirmed with [name], along with patient's nurse, that she already has medications at home- lovenox, potassium, spirolactone, torsemide, and coumadin. [name] states she has been given specific instructions on when to take her medications. States she has enough medications and will pick the prescriptions on Monday from [place]. Denies any TOC needs at this time.     Final next level of care: Home/Self Care Barriers to Discharge: No Barriers Identified   Patient Goals and CMS Choice Patient states their goals for this hospitalization and ongoing recovery are:: return home CMS Medicare.gov Compare Post Acute Care list provided to::  (n/a) Choice offered to / list presented to : NA  Discharge Placement                       Discharge Plan and Services In-house Referral: NA Discharge Planning Services: CM Consult Post Acute Care Choice: NA          DME Arranged: N/A DME Agency: NA       HH Arranged: NA HH Agency: NA        Social Determinants of Health (SDOH) Interventions     Readmission Risk Interventions    02/06/2022   12:34 PM 09/17/2021   10:03 AM 07/19/2021    3:49 PM  Readmission Risk Prevention Plan  Transportation Screening Complete Complete Complete  PCP or Specialist Appt within 5-7 Days   Complete  PCP or Specialist Appt within 3-5  Days Complete    Home Care Screening   Complete  Medication Review (RN CM)   Complete  HRI or Home Care Consult Complete    Social Work Consult for [place] Planning/Counseling Complete    Palliative Care Screening Not Applicable    Medication Review Press photographer) Referral to Pharmacy    PCP or Specialist appointment within 3-5 days of discharge  Complete   HRI or [place]  Complete   SW Recovery Care/Counseling Consult  Complete   [place]  Not Applicable

## 2022-02-10 ENCOUNTER — Telehealth: Payer: Self-pay | Admitting: Gynecologic Oncology

## 2022-02-10 ENCOUNTER — Telehealth: Payer: Self-pay

## 2022-02-10 LAB — SURGICAL PATHOLOGY

## 2022-02-10 NOTE — Telephone Encounter (Signed)
Spoke with [name] this morning. She states she is eating, drinking and urinating well. She has had several BMs. She has stopped taking senokot-s. Encouraged her to drink plenty of water. She denies fever or chills. Incisions are dry and intact. She rates her pain 1-2/10. She has not needed any pain medication since Saturday 02-08-22.  Instructed to call office with any fever, chills, purulent drainage, uncontrolled pain or any other questions or concerns. Patient verbalizes understanding.   Pt aware of post op appointments as well as the office number [phone] and after hours number [phone] to call if she has any questions or concerns

## 2022-02-10 NOTE — Telephone Encounter (Signed)
Called the patient, she is doing well since discharge.  Discussed pathology.  Left ovary with benign cyst.  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  We will clarify with pathology whether atypical cells in the pelvic washings are related to the granulosa cell tumor or suspected to be something else.  Jeral Pinch MD Gynecologic Oncology

## 2022-02-11 ENCOUNTER — Other Ambulatory Visit: Payer: Self-pay | Admitting: Obstetrics and Gynecology

## 2022-02-11 ENCOUNTER — Ambulatory Visit (HOSPITAL_COMMUNITY): Payer: Self-pay | Admitting: Pharmacist

## 2022-02-11 ENCOUNTER — Ambulatory Visit: Payer: BC Managed Care – PPO | Admitting: Internal Medicine

## 2022-02-11 DIAGNOSIS — R928 Other abnormal and inconclusive findings on diagnostic imaging of breast: Secondary | ICD-10-CM

## 2022-02-11 LAB — POCT INR: INR: 1.3 — AB (ref 2.0–3.0)

## 2022-02-11 LAB — CYTOLOGY - NON PAP

## 2022-02-12 ENCOUNTER — Telehealth: Payer: Self-pay

## 2022-02-12 ENCOUNTER — Inpatient Hospital Stay: Payer: BC Managed Care – PPO | Admitting: Gynecologic Oncology

## 2022-02-12 NOTE — Telephone Encounter (Signed)
I spoke to pt at the request of [name] to see how she is doing. Pt states she is doing ok with a little pain on her left side. She rates it 2/10 on pain scale. It is relieved by Tylenol.   Advised pt to call office with any questions or concerns. She voiced an understanding and was thankful for the call. [name] notified.

## 2022-02-17 ENCOUNTER — Encounter: Payer: BC Managed Care – PPO | Admitting: *Deleted

## 2022-02-17 ENCOUNTER — Ambulatory Visit (HOSPITAL_BASED_OUTPATIENT_CLINIC_OR_DEPARTMENT_OTHER): Payer: BC Managed Care – PPO | Attending: Cardiology | Admitting: Cardiology

## 2022-02-17 ENCOUNTER — Other Ambulatory Visit (HOSPITAL_COMMUNITY): Payer: Self-pay

## 2022-02-17 DIAGNOSIS — I5022 Chronic systolic (congestive) heart failure: Secondary | ICD-10-CM

## 2022-02-17 DIAGNOSIS — R5383 Other fatigue: Secondary | ICD-10-CM | POA: Diagnosis not present

## 2022-02-17 DIAGNOSIS — Z6841 Body Mass Index (BMI) 40.0 and over, adult: Secondary | ICD-10-CM | POA: Insufficient documentation

## 2022-02-17 DIAGNOSIS — R0683 Snoring: Secondary | ICD-10-CM | POA: Diagnosis not present

## 2022-02-17 DIAGNOSIS — G4733 Obstructive sleep apnea (adult) (pediatric): Secondary | ICD-10-CM | POA: Insufficient documentation

## 2022-02-17 DIAGNOSIS — I493 Ventricular premature depolarization: Secondary | ICD-10-CM | POA: Insufficient documentation

## 2022-02-17 DIAGNOSIS — Z95811 Presence of heart assist device: Secondary | ICD-10-CM | POA: Diagnosis not present

## 2022-02-17 DIAGNOSIS — E669 Obesity, unspecified: Secondary | ICD-10-CM | POA: Insufficient documentation

## 2022-02-17 DIAGNOSIS — I5042 Chronic combined systolic (congestive) and diastolic (congestive) heart failure: Secondary | ICD-10-CM

## 2022-02-17 DIAGNOSIS — Z7901 Long term (current) use of anticoagulants: Secondary | ICD-10-CM

## 2022-02-17 DIAGNOSIS — I509 Heart failure, unspecified: Secondary | ICD-10-CM | POA: Diagnosis not present

## 2022-02-17 NOTE — Progress Notes (Signed)
Daily Session Note  Patient Details  Name: [name] MRN: [id number] Date of Birth: [birth year] Referring Provider:   Flowsheet Row Cardiac Rehab from 11/13/2021 in [facility] Cardiac and Pulmonary Rehab  Referring Provider [name] MD       Encounter Date: 02/17/2022  Check In:  Session Check In - 02/17/22 0956       Check-In   Supervising physician immediately available to respond to emergencies See telemetry face sheet for immediately available ER MD    Location [facility]-Cardiac & Pulmonary Rehab    Staff Present Heath Lark, RN, BSN, CCRP;Joseph [place], RCP,RRT,BSRT;Kelly [place], [place], ACSM CEP, Exercise Physiologist    Virtual Visit No    Medication changes reported     No    Comments returning after robotic abdominal surgery    Fall or balance concerns reported    No    Warm-up and Cool-down Performed on first and last piece of equipment    Resistance Training Performed Yes    VAD Patient? Yes    PAD/SET Patient? No      VAD patient   Has back up controller? Yes    Has spare charged batteries? Yes    Has battery cables? Yes    Has compatible battery clips? Yes      Pain Assessment   Currently in Pain? No/denies                Social History   Tobacco Use  Smoking Status Never  Smokeless Tobacco Never    Goals Met:  Independence with exercise equipment Exercise tolerated well No report of concerns or symptoms today  Goals Unmet:  Not Applicable  Comments: Pt able to follow exercise prescription today without complaint.  Will continue to monitor for progression.    Dr. Emily Filbert is Medical Director for [place].  Dr. Ottie Glazier is Medical Director for [facility] Pulmonary Rehabilitation.

## 2022-02-18 NOTE — Procedures (Signed)
   Patient Name: [name], [name] Date:02/17/2022 Gender: Female D.O.B: 1971-05-24 Age (years): 43 Referring Provider: Fransico Him MD, ABSM Height (inches): 63 Interpreting Physician: Fransico Him MD, ABSM Weight (lbs): 235 RPSGT: Carolin Coy BMI: 38 MRN: [id number] Neck Size: 14.50  CLINICAL INFORMATION Sleep Study Type: NPSG  Indication for sleep study: Congestive Heart Failure, Fatigue, Obesity, OSA  Epworth Sleepiness Score:   Most recent polysomnogram dated 12/30/2021 revealed an AHI of 0.3/h and RDI of 2.1/h. SLEEP STUDY TECHNIQUE As per the AASM Manual for the Scoring of Sleep and Associated Events v2.3 (April 2016) with a hypopnea requiring 4% desaturations.  The channels recorded and monitored were frontal, central and occipital EEG, electrooculogram (EOG), submentalis EMG (chin), nasal and oral airflow, thoracic and abdominal wall motion, anterior tibialis EMG, snore microphone, electrocardiogram, and pulse oximetry.  MEDICATIONS Medications self-administered by patient taken the night of the study : QUINIDINE, SILDENAFIL CITRATE, CRESTOR, eszopiclone  SLEEP ARCHITECTURE The study was initiated at 10:41:46 PM and ended at 4:45:25 AM.  Sleep onset time was 44.7 minutes and the sleep efficiency was 77.5%. The total sleep time was 282 minutes.  Stage REM latency was 152.5 minutes.  The patient spent 9.0% of the night in stage N1 sleep, 68.6% in stage N2 sleep, 0.0% in stage N3 and 22.3% in REM.  Alpha intrusion was absent.  Supine sleep was 24.11%.  RESPIRATORY PARAMETERS The overall apnea/hypopnea index (AHI) was 0.2 per hour. There were 0 total apneas, including 0 obstructive, 0 central and 0 mixed apneas. There were 1 hypopneas and 124 RERAs.  The AHI during Stage REM sleep was 0.0 per hour.  AHI while supine was 0.0 per hour.  The mean oxygen saturation was 98.3%. The minimum SpO2 during sleep was 95.0%.  soft snoring was noted during this  study.  CARDIAC DATA The 2 lead EKG demonstrated sinus rhythm. The mean heart rate was 87.5 beats per minute. Other EKG findings include: PVCs  LEG MOVEMENT DATA The total PLMS were 0 with a resulting PLMS index of 0.0. Associated arousal with leg movement index was 4.9 .  IMPRESSIONS - No significant obstructive sleep apnea occurred during this study (AHI = 0.2/h). - The patient had minimal or no oxygen desaturation during the study (Min O2 = 95.0%) - The patient snored with soft snoring volume. - PVCs were noted during this study. - Clinically significant periodic limb movements did not occur during sleep. No significant associated arousals.  DIAGNOSIS - Normal Study - PVCs  RECOMMENDATIONS - Avoid alcohol, sedatives and other CNS depressants that may worsen sleep apnea and disrupt normal sleep architecture. - Sleep hygiene should be reviewed to assess factors that may improve sleep quality. - Weight management and regular exercise should be initiated or continued if appropriate.  [Electronically signed] 02/18/2022 12:09 PM  Fransico Him MD, ABSM Diplomate, American Board of Sleep Medicine

## 2022-02-19 ENCOUNTER — Encounter: Payer: BC Managed Care – PPO | Admitting: *Deleted

## 2022-02-19 ENCOUNTER — Ambulatory Visit (HOSPITAL_COMMUNITY): Payer: Self-pay | Admitting: Pharmacist

## 2022-02-19 ENCOUNTER — Encounter (HOSPITAL_COMMUNITY): Payer: Self-pay

## 2022-02-19 ENCOUNTER — Ambulatory Visit (HOSPITAL_COMMUNITY)
Admission: RE | Admit: 2022-02-19 | Discharge: 2022-02-19 | Disposition: A | Discharge status: Home / Self Care | Payer: BC Managed Care – PPO | Source: Ambulatory Visit | Attending: Cardiology | Admitting: Cardiology

## 2022-02-19 ENCOUNTER — Telehealth: Payer: Self-pay | Admitting: *Deleted

## 2022-02-19 VITALS — BP 120/57 | HR 88 | Wt 239.0 lb

## 2022-02-19 DIAGNOSIS — I5022 Chronic systolic (congestive) heart failure: Secondary | ICD-10-CM

## 2022-02-19 DIAGNOSIS — Z48812 Encounter for surgical aftercare following surgery on the circulatory system: Secondary | ICD-10-CM | POA: Insufficient documentation

## 2022-02-19 DIAGNOSIS — I5042 Chronic combined systolic (congestive) and diastolic (congestive) heart failure: Secondary | ICD-10-CM

## 2022-02-19 DIAGNOSIS — I480 Paroxysmal atrial fibrillation: Secondary | ICD-10-CM

## 2022-02-19 DIAGNOSIS — Z95811 Presence of heart assist device: Secondary | ICD-10-CM

## 2022-02-19 DIAGNOSIS — G4733 Obstructive sleep apnea (adult) (pediatric): Secondary | ICD-10-CM

## 2022-02-19 DIAGNOSIS — Z7901 Long term (current) use of anticoagulants: Secondary | ICD-10-CM | POA: Diagnosis not present

## 2022-02-19 LAB — CBC
HCT: 35.6 % — ABNORMAL LOW (ref 36.0–46.0)
Hemoglobin: 10.6 g/dL — ABNORMAL LOW (ref 12.0–15.0)
MCH: 25.1 pg — ABNORMAL LOW (ref 26.0–34.0)
MCHC: 29.8 g/dL — ABNORMAL LOW (ref 30.0–36.0)
MCV: 84.4 fL (ref 80.0–100.0)
Platelets: 295 10*3/uL (ref 150–400)
RBC: 4.22 MIL/uL (ref 3.87–5.11)
RDW: 15.3 % (ref 11.5–15.5)
WBC: 9.8 10*3/uL (ref 4.0–10.5)
nRBC: 0 % (ref 0.0–0.2)

## 2022-02-19 LAB — LACTATE DEHYDROGENASE: LDH: 239 U/L — ABNORMAL HIGH (ref 98–192)

## 2022-02-19 LAB — PROTIME-INR
INR: 1.4 — ABNORMAL HIGH (ref 0.8–1.2)
Prothrombin Time: 17.3 seconds — ABNORMAL HIGH (ref 11.4–15.2)

## 2022-02-19 LAB — BASIC METABOLIC PANEL
Anion gap: 7 (ref 5–15)
BUN: 12 mg/dL (ref 6–20)
CO2: 22 mmol/L (ref 22–32)
Calcium: 9 mg/dL (ref 8.9–10.3)
Chloride: 110 mmol/L (ref 98–111)
Creatinine, Ser: 1.27 mg/dL — ABNORMAL HIGH (ref 0.44–1.00)
GFR, Estimated: 51 mL/min — ABNORMAL LOW (ref >60)
Glucose, Bld: 77 mg/dL (ref 70–99)
Potassium: 4.1 mmol/L (ref 3.5–5.1)
Sodium: 139 mmol/L (ref 135–145)

## 2022-02-19 MED ORDER — POTASSIUM CHLORIDE CRYS ER 20 MEQ PO TBCR: 60.0000 meq | EXTENDED_RELEASE_TABLET | Freq: Every day | ORAL | 3 refills | Status: DC

## 2022-02-19 MED ORDER — WARFARIN SODIUM 2 MG PO TABS: ORAL_TABLET | ORAL | 5 refills | Status: DC

## 2022-02-19 MED ORDER — TORSEMIDE 20 MG PO TABS: 40.0000 mg | ORAL_TABLET | Freq: Every day | ORAL | 3 refills | Status: DC

## 2022-02-19 NOTE — Progress Notes (Signed)
Daily Session Note  Patient Details  Name: PRARTHANA PARLIN MRN: [id number] Date of Birth: [birth year] Referring Provider:   Flowsheet Row Cardiac Rehab from 11/13/2021 in [facility] Cardiac and Pulmonary Rehab  Referring Provider [name] MD       Encounter Date: 02/19/2022  Check In:  Session Check In - 02/19/22 0842       Check-In   Supervising physician immediately available to respond to emergencies See telemetry face sheet for immediately available ER MD    Location [facility]-Cardiac & Pulmonary Rehab    Staff Present Nyoka Cowden, RN, BSN, Lauretta Grill, RCP,RRT,BSRT;Melissa Vaughn, [place], LDN    Virtual Visit No    Medication changes reported     No    Fall or balance concerns reported    No    Tobacco Cessation No Change    Warm-up and Cool-down Performed on first and last piece of equipment    Resistance Training Performed Yes    VAD Patient? Yes    PAD/SET Patient? No      VAD patient   Has back up controller? Yes    Has spare charged batteries? Yes    Has battery cables? Yes    Has compatible battery clips? Yes      Pain Assessment   Currently in Pain? No/denies                Social History   Tobacco Use  Smoking Status Never  Smokeless Tobacco Never    Goals Met:  Independence with exercise equipment Exercise tolerated well No report of concerns or symptoms today  Goals Unmet:  Not Applicable  Comments: Pt able to follow exercise prescription today without complaint.  Will continue to monitor for progression.    Dr. Emily Filbert is Medical Director for [place].  Dr. Ottie Glazier is Medical Director for [facility] Pulmonary Rehabilitation.

## 2022-02-19 NOTE — Progress Notes (Signed)
Patient presents for hospital d/c f/u in [facility] today with her husband [name]. Reports no problems with VAD equipment or concerns with drive line.  Pt states that she is feeling pretty good since she discharged home. Denies lightheadedness, dizziness, and signs of bleeding. Has had no vaginal bleeding since going home. Reports some shortness of breath when she is "rushing" around. This resolves with rest.   Weight up 7 lbs from discharge. She feels like this is fluid related. Neck veins up. Discussed with Dr [name]. Will restart Torsemide 40 mg daily with K 60 meq daily. Updated prescriptions sent to pt's pharmacy. Advised pt if she becomes dizzy or lightheaded to notify VAD coordinators. She verbalized understanding. Will recheck BMET next week.   She had follow up with Dr Berline Lopes. Per Dr Charisse March note: "Left ovary with benign cyst. 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  We will clarify with pathology whether atypical cells in the pelvic washings are related to the granulosa cell tumor or suspected to be something else." Jahnia says she talked with Dr Berline Lopes again and was told that the pelvic washings were benign. All laparoscopic incisions healing, well approximated.   Reports a lump was found during recent mammogram. She has repeat imaging scheduled on Friday. She will update [name] on findings.   Blister noted under Sorbaview dressing last week. Switched to daily dressing kit, changing weekly. Blister partially healed. Will continue weekly dressings using daily kit until blister resolved. Unsure what caused blister.     Pt participating in cardiac rehab at [facility]. She is enjoying this so far.   Vital Signs:  Doppler Pressure: 148 (not correlating)  Automatc BP: 120/57 (86) HR: 88 SR w/ occasional PVCs SPO2: UTO % on RA   Weight: 239 lbs w/o eqt Last weight: 232 lb w/o eqt Home weights: 223-227 lbs  VAD Indication: Destination  Therapy d/t BMI   VAD interrogation & Equipment Management: Speed: 5900 Flow: 5.4 Power: 4.8 w    PI: 2.7   Alarms: none Events: 10 - 20 daily  Fixed speed 5900 Low speed limit: 5600   Primary Controller:  Replace back up battery in 26 months. Back up controller:   Replace back up battery in 30 months.    Annual Equipment Maintenance on UBC/PM was performed on 09/03/21.    I reviewed the LVAD parameters from today and compared the results to the patient's prior recorded data. LVAD interrogation was NEGATIVE for significant power changes, NEGATIVE for clinical alarms and STABLE for PI events/speed drops. No programming changes were made and pump is functioning within specified parameters. Pt is performing daily controller and system monitor self tests along with completing weekly and monthly maintenance for LVAD equipment.   LVAD equipment check completed and is in good working order. Back-up equipment present. Charged back up battery and performed self-test on equipment.    Exit Site Care: Drive line is being maintained daily by her husband [name]. Existing VAD dressing removed and site care performed using sterile technique. Drive line exit site cleaned with Chlora prep applicators x 2, allowed to dry, and gauze dressing WITHOUT Silver strip applied. Exit site healing and partially incorporated, the velour is fully implanted at exit site. No drainage, redness, tenderness, foul odor or rash noted. Partially healed blister seen as noted above. Drive line anchor re-applied. Pt denies fever or chills. Continue weekly dressing changes without silver strip. Provided with 7 daily kits and 10  anchors for home use.   Significant Events on VAD Support:    Device: Medtronic Therapies: on VF ?240 CT 200-240 VVI: 40 Last check: 09/18/21   BP & Labs:  Doppler 148 - not correlating with auto cuff   Hgb 10.6 - No S/S of bleeding. Specifically denies vaginal bleeding, melena/BRBPR or nosebleeds.     LDH stable at 239 with established baseline of 250 - 450. Denies tea-colored urine. No power elevations noted on interrogation.   Plan:  Start Torsemide 40 mg daily. If you become lightheaded or dizzy please notify VAD coordinators Increase Potassium 60 meq daily Coumadin dosing per [name] Return to [place] clinic in 1 month for follow up with Dr [name] Return to [place] clinic in 10 days for repeat BMET  [name] RN [place] Coordinator  Office: [phone]  24/7 Pager: [phone]

## 2022-02-19 NOTE — Patient Instructions (Signed)
Start Torsemide 40 mg daily. If you become lightheaded or dizzy please notify VAD coordinators Increase Potassium 60 meq daily Coumadin dosing per [name] Return to [place] clinic in 1 month for follow up with Dr [name] Return to [place] clinic in 10 days for repeat BMET

## 2022-02-19 NOTE — Telephone Encounter (Signed)
The patient has been notified of the result and verbalized understanding.  All questions (if any) were answered. Marolyn Hammock, [place] 02/19/2022 5:17 PM    Pt is agreeable to normal results.

## 2022-02-19 NOTE — Telephone Encounter (Signed)
-----   Message from Lauralee Evener, [place] sent at 02/18/2022  4:25 PM EDT -----  ----- Message ----- From: Sueanne Margarita, MD Sent: 02/18/2022  12:10 PM EDT To: Cv Div Sleep Studies  Please let patient know that sleep study showed no significant sleep apnea.

## 2022-02-21 ENCOUNTER — Ambulatory Visit
Admission: RE | Admit: 2022-02-21 | Discharge: 2022-02-21 | Disposition: A | Discharge status: Home / Self Care | Payer: BC Managed Care – PPO | Source: Ambulatory Visit | Attending: Obstetrics and Gynecology | Admitting: Obstetrics and Gynecology

## 2022-02-21 ENCOUNTER — Other Ambulatory Visit (HOSPITAL_COMMUNITY): Payer: Self-pay | Admitting: *Deleted

## 2022-02-21 ENCOUNTER — Encounter: Payer: BC Managed Care – PPO | Attending: Cardiology | Admitting: *Deleted

## 2022-02-21 ENCOUNTER — Other Ambulatory Visit (HOSPITAL_COMMUNITY): Payer: BC Managed Care – PPO

## 2022-02-21 DIAGNOSIS — I5022 Chronic systolic (congestive) heart failure: Secondary | ICD-10-CM

## 2022-02-21 DIAGNOSIS — R928 Other abnormal and inconclusive findings on diagnostic imaging of breast: Secondary | ICD-10-CM | POA: Diagnosis not present

## 2022-02-21 DIAGNOSIS — Z48812 Encounter for surgical aftercare following surgery on the circulatory system: Secondary | ICD-10-CM | POA: Insufficient documentation

## 2022-02-21 DIAGNOSIS — I5042 Chronic combined systolic (congestive) and diastolic (congestive) heart failure: Secondary | ICD-10-CM | POA: Insufficient documentation

## 2022-02-21 DIAGNOSIS — Z95811 Presence of heart assist device: Secondary | ICD-10-CM | POA: Insufficient documentation

## 2022-02-21 DIAGNOSIS — N6489 Other specified disorders of breast: Secondary | ICD-10-CM | POA: Diagnosis not present

## 2022-02-21 NOTE — Addendum Note (Signed)
Addended by: Freada Bergeron on: 02/21/2022 08:53 AM   Modules accepted: Orders

## 2022-02-21 NOTE — Progress Notes (Signed)
Daily Session Note  Patient Details  Name: [name] MRN: [id number] Date of Birth: 1970/08/20 Referring Provider:   Flowsheet Row Cardiac Rehab from 11/13/2021 in [facility] Cardiac and Pulmonary Rehab  Referring Provider [name] MD       Encounter Date: 02/21/2022  Check In:  Session Check In - 02/21/22 0958       Check-In   Supervising physician immediately available to respond to emergencies See telemetry face sheet for immediately available ER MD    Location [facility]-Cardiac & Pulmonary Rehab    Staff Present Nyoka Cowden, RN, BSN, Lauretta Grill, [place], [place], RCEP, CCRP, CCET    Virtual Visit No    Medication changes reported     No    Fall or balance concerns reported    No    Tobacco Cessation No Change    Warm-up and Cool-down Performed on first and last piece of equipment    Resistance Training Performed Yes    VAD Patient? No    PAD/SET Patient? No      VAD patient   Has back up controller? Yes    Has spare charged batteries? Yes    Has battery cables? Yes    Has compatible battery clips? Yes      Pain Assessment   Currently in Pain? No/denies                Social History   Tobacco Use  Smoking Status Never  Smokeless Tobacco Never    Goals Met:  Independence with exercise equipment Exercise tolerated well No report of concerns or symptoms today  Goals Unmet:  Not Applicable  Comments: Pt able to follow exercise prescription today without complaint.  Will continue to monitor for progression.    Dr. Emily Filbert is Medical Director for [name].  Dr. Ottie Glazier is Medical Director for [name] Pulmonary Rehabilitation.

## 2022-02-26 ENCOUNTER — Encounter: Payer: BC Managed Care – PPO | Admitting: *Deleted

## 2022-02-26 ENCOUNTER — Encounter: Payer: Self-pay | Admitting: *Deleted

## 2022-02-26 VITALS — Ht 64.0 in | Wt 243.7 lb

## 2022-02-26 DIAGNOSIS — Z95811 Presence of heart assist device: Secondary | ICD-10-CM

## 2022-02-26 DIAGNOSIS — I5042 Chronic combined systolic (congestive) and diastolic (congestive) heart failure: Secondary | ICD-10-CM

## 2022-02-26 DIAGNOSIS — R5381 Other malaise: Secondary | ICD-10-CM | POA: Diagnosis not present

## 2022-02-26 DIAGNOSIS — Z48812 Encounter for surgical aftercare following surgery on the circulatory system: Secondary | ICD-10-CM | POA: Diagnosis not present

## 2022-02-26 NOTE — Patient Instructions (Signed)
Discharge Patient Instructions  Patient Details  Name: [name] MRN: [id number] Date of Birth: [birth year] Referring Provider:  No ref. provider found   Number of Visits: 36  Reason for Discharge:  Patient reached a stable level of exercise. Patient independent in their exercise. Patient has met program and personal goals.  Smoking History:  Social History   Tobacco Use  Smoking Status Never  Smokeless Tobacco Never    Diagnosis:  LVAD (left ventricular assist device) present ([place])  Chronic combined systolic and diastolic heart failure (HCC)  Initial Exercise Prescription:  Initial Exercise Prescription - 11/13/21 1200       Date of Initial Exercise RX and Referring Provider   Date 11/13/21    Referring Provider [name] MD      Oxygen   Maintain Oxygen Saturation 88% or higher      Recumbant Bike   Level 1    RPM 60    Watts 22    Minutes 15    METs 2.6      NuStep   Level 1    SPM 80    Minutes 15    METs 2.6      REL-XR   Level 1    Speed 50    Minutes 15    METs 2.6      Track   Laps 21    Minutes 15    METs 2.14      Prescription Details   Frequency (times per week) 3    Duration Progress to 30 minutes of continuous aerobic without signs/symptoms of physical distress      Intensity   THRR 40-80% of Max Heartrate 116- 152    Ratings of Perceived Exertion 11-13    Perceived Dyspnea 0-4      Progression   Progression Continue to progress workloads to maintain intensity without signs/symptoms of physical distress.      Resistance Training   Training Prescription Yes    Weight 3 lb    Reps 10-15             Discharge Exercise Prescription (Final Exercise Prescription Changes):  Exercise Prescription Changes - 02/10/22 1000       Response to Exercise   Blood Pressure (Admit) 110/79    Blood Pressure (Exit) 108/87    Heart Rate (Admit) 86 bpm    Heart Rate (Exercise) 126 bpm    Heart Rate (Exit) 71 bpm    Rating of  Perceived Exertion (Exercise) 15    Symptoms none    Duration Continue with 30 min of aerobic exercise without signs/symptoms of physical distress.    Intensity THRR unchanged      Progression   Progression Continue to progress workloads to maintain intensity without signs/symptoms of physical distress.    Average METs 2.59      Resistance Training   Training Prescription Yes    Weight 3 lb    Reps 10-15      Interval Training   Interval Training Yes    Equipment Treadmill    Comments intervals of speed from 2-2.5 mph      Treadmill   MPH 2.5    Grade 1    Minutes 15    METs 3.26      Recumbant Bike   Level 2    Minutes 15    METs 2.9      NuStep   Level 6    Minutes 15    METs 3.1  REL-XR   Level 4    Minutes 15    METs 1.9      Home Exercise Plan   Plans to continue exercise at Home (comment)   walking, treadmill, staff videos   Frequency Add 2 additional days to program exercise sessions.    Initial Home Exercises Provided 12/18/21      Oxygen   Maintain Oxygen Saturation 88% or higher             Functional Capacity:  6 Minute Walk     Row Name 11/13/21 1245 02/26/22 1205       6 Minute Walk   Phase Initial Discharge    Distance 985 feet 1306 feet    Distance % Change -- 32.6 %    Distance Feet Change -- 321 ft    Walk Time 6 minutes 6 minutes    # of Rest Breaks 0 0    MPH 1.86 2.47    METS 2.68 3.46    RPE 10 15    Perceived Dyspnea  1 --    VO2 Peak 9.41 12.11    Symptoms Yes (comment) Yes (comment)    Comments Slight SOB, unsteadiness with balance cramping in calves    Resting HR 81 bpm 78 bpm    Resting BP 103/64  automated BP cuff 82/43    Resting Oxygen Saturation  97 % 98 %    Exercise Oxygen Saturation  during 6 min walk 96 % 98 %    Max Ex. HR 105 bpm 135 bpm    Max Ex. BP 112/83  automated BP cuff 130/90    2 Minute Post BP 85/74  automated BP cuff; On entresto and denied dizziness/lightheadedness --            Nutrition & Weight - Outcomes:  Pre Biometrics - 11/13/21 1234       Pre Biometrics   Height 5' 4" (1.626 m)    Weight 230 lb 12.8 oz (104.7 kg)   Weighed with LVAD   BMI (Calculated) 39.6    Single Leg Stand 30 seconds             Post Biometrics - 02/26/22 1206        Post  Biometrics   Height 5' 4" (1.626 m)    Weight 243 lb 11.2 oz (110.5 kg)    BMI (Calculated) 41.81    Single Leg Stand 30 seconds             Nutrition:  Nutrition Therapy & Goals - 12/09/21 0912       Nutrition Therapy   Diet Heart healthy, low Na    Drug/Food Interactions Statins/Certain Fruits;Coumadin/Vit K    Protein (specify units) 100g    Fiber 25 grams    Whole Grain Foods 3 servings    Saturated Fats 12 max. grams    Fruits and Vegetables 8 servings/day    Sodium 1.5 grams      Personal Nutrition Goals   Nutrition Goal ST: limit sugar-sweetened beverages, try citris or vinegar with meals to help with flavor, include vitamin K rich food consistently - work with health care team LT: continue with mechanical eating to meet nutritional needs, follow MyPlate guidelines for meals, limit added sugar <24g/day    Comments 51 y.o. F admitted to cardiac rehab  for chronic combined systolic and diastolic HF. PMHx includes hypotension, CVA (2021), NICM EF 20-25%, a.fib, UARS, hyperthyroidism, LVAD (08/2021), moderate malnutrition (08/2021), MI (2015). Relevant  medications includes methimazole, crestor, prednisone, warfarin, zinc, torsemide PRN. PYP Score: 70. Vegetables & Fruits 7/12. Breads, Grains & Cereals 8/12. Red & Processed Meat 9/12. Poultry 2/2. Fish & Shellfish 2/4. Beans, Nuts & Seeds 3/4. Milk & Dairy Foods 6/6. Toppings, Oils, Seasonings & Salt 13/20. Sweets, Snacks & Restaurant Food 12/14. Beverages 8/10.  Breakfast: 1 egg with 1.5 pieces of bacon or [place] bacon Snacks: almonds, skinny pop, watermelon Lunch: same as dinner Snacks: almonds, skinny pop, watermelon Dinner: pork tenderloin,  chicken breast, occasional steak - cook to have for the week. They have a garden so she will eat vegetables from her garden - she is limiting her vitamin K rich food. She will cook with some butter but mostly she will use pam spray. She rarely has fried foods, but will sometimes have fried chicken. She does not salt her food. Drinks: lemonade, water, OJ, sometimes cranberry-watermelon juice. She reports that she is practicing mechanical eating as she is not feeling her hunger cues. She is also struggling with a metallic taste in her mouth which she reports is getting better. Encouraged to have protein rich foods at each meal and spoke to her higher protein needs. Encouraged her to limit her sugar sweetened beverages and include more nutrient dense foods such as peanut butter as this is something she enjoys. She would like to lose weight - discussed how it is important she meet her nutriitonal needs to ensure proper nutrition and limit muscle loss; also discussed the body's compensatory mechanisms. She reorts limiting vitamin K rich food, discussed updated recommendations to include these foods consistently and in similar amounts and to work with health care team. Reviewed heart healthy eating.      Intervention Plan   Intervention Prescribe, educate and counsel regarding individualized specific dietary modifications aiming towards targeted core components such as weight, hypertension, lipid management, diabetes, heart failure and other comorbidities.    Expected Outcomes Short Term Goal: Understand basic principles of dietary content, such as calories, fat, sodium, cholesterol and nutrients.;Short Term Goal: A plan has been developed with personal nutrition goals set during dietitian appointment.;Long Term Goal: Adherence to prescribed nutrition plan.            Goals reviewed with patient; copy given to patient.

## 2022-02-26 NOTE — Progress Notes (Signed)
Daily Session Note  Patient Details  Name: [name] MRN: [id number] Date of Birth: 04/19/1971 Referring Provider:   Flowsheet Row Cardiac Rehab from 11/13/2021 in [facility]  Referring Provider [name] MD       Encounter Date: 02/26/2022  Check In:  Session Check In - 02/26/22 1142       Check-In   Supervising physician immediately available to respond to emergencies See telemetry face sheet for immediately available ER MD    Location [facility]-Cardiac & Pulmonary Rehab    Staff Present Heath Lark, RN, BSN, CCRP;Joseph Hood, RCP,RRT,BSRT;Noah Tickle, [place], Exercise Physiologist    Virtual Visit No    Medication changes reported     No    Fall or balance concerns reported    No    Warm-up and Cool-down Performed on first and last piece of equipment    Resistance Training Performed Yes    VAD Patient? Yes    PAD/SET Patient? No      VAD patient   Has back up controller? Yes    Has spare charged batteries? Yes    Has battery cables? Yes    Has compatible battery clips? Yes      Pain Assessment   Currently in Pain? No/denies                Social History   Tobacco Use  Smoking Status Never  Smokeless Tobacco Never    Goals Met:  Independence with exercise equipment Exercise tolerated well No report of concerns or symptoms today  Goals Unmet:  Not Applicable  Comments: Pt able to follow exercise prescription today without complaint.  Will continue to monitor for progression.    Dr. Emily Filbert is Medical Director for [facility].  Dr. Ottie Glazier is Medical Director for [facility] Pulmonary Rehabilitation.

## 2022-02-26 NOTE — Progress Notes (Signed)
Cardiac Individual Treatment Plan  Patient Details  Name: [name] MRN: [id number] Date of Birth: 09-24-1970 Referring Provider:   Flowsheet Row Cardiac Rehab from 11/13/2021 in [facility] Cardiac and Pulmonary Rehab  Referring Provider [name] MD       Initial Encounter Date:  Flowsheet Row Cardiac Rehab from 11/13/2021 in [facility] Cardiac and Pulmonary Rehab  Date 11/13/21       Visit Diagnosis: LVAD (left ventricular assist device) present ([place])  Chronic combined systolic and diastolic heart failure ([place])  Patient's Home Medications on Admission:  Current Outpatient Medications:    aspirin EC 81 MG tablet, Take 1 tablet (81 mg total) by mouth daily. Swallow whole., Disp: 90 tablet, Rfl: 3   enoxaparin (LOVENOX) 40 MG/0.4ML injection, Inject 0.4 mLs (40 mg total) into the skin every 12 (twelve) hours. (Patient not taking: Reported on 02/19/2022), Disp: 0.4 mL, Rfl: 0   methimazole (TAPAZOLE) 10 MG tablet, Take 1 tablet (10 mg total) by mouth 2 (two) times daily., Disp: 180 tablet, Rfl: 1   potassium chloride SA (KLOR-CON M) 20 MEQ tablet, Take 3 tablets (60 mEq total) by mouth daily., Disp: 270 tablet, Rfl: 3   predniSONE (DELTASONE) 10 MG tablet, Take 1 tablet (10 mg total) by mouth daily with breakfast., Disp: 90 tablet, Rfl: 1   quiniDINE gluconate 324 MG CR tablet, Take 1 tablet (324 mg total) by mouth 2 (two) times daily., Disp: 60 tablet, Rfl: 0   rosuvastatin (CRESTOR) 40 MG tablet, Take 1 tablet (40 mg total) by mouth at bedtime., Disp: 30 tablet, Rfl: 0   senna-docusate (SENOKOT-S) 8.6-50 MG tablet, Take 2 tablets by mouth at bedtime. For AFTER surgery, do not take if having diarrhea (Patient not taking: Reported on 02/19/2022), Disp: 30 tablet, Rfl: 0   sildenafil (REVATIO) 20 MG tablet, Take 1 tablet (20 mg total) by mouth 3 (three) times daily., Disp: 90 tablet, Rfl: 6   spironolactone (ALDACTONE) 25 MG tablet, Take 1 tablet (25 mg total) by mouth daily. Restart on  02/10/22., Disp: 45 tablet, Rfl: 3   torsemide (DEMADEX) 20 MG tablet, Take 2 tablets (40 mg total) by mouth daily., Disp: 180 tablet, Rfl: 3   traMADol (ULTRAM) 50 MG tablet, Take 1 tablet (50 mg total) by mouth every 6 (six) hours as needed for severe pain. For AFTER surgery only, do not take and drive (Patient not taking: Reported on 02/19/2022), Disp: 10 tablet, Rfl: 0   warfarin (COUMADIN) 2 MG tablet, Take 2 mg every Tuesday and 4 mg all other days or as directed by LV[facility] Clinic., Disp: 120 tablet, Rfl: 5   zinc gluconate 50 MG tablet, Take 1 tablet (50 mg total) by mouth daily., Disp: 30 tablet, Rfl: 6  Past Medical History: Past Medical History:  Diagnosis Date   Automatic implantable cardioverter-defibrillator in situ    Chronic CHF (congestive heart failure) (HCC)    a. EF 15-20% b. RHC (09/2013) RA 14, RV 57/22, PA 64/36 (48), PCWP 18, FIck CO/CI 3.7/1.6, PVR 8.1 WU, PA sat 47%    History of hypothyroidism    History of stomach ulcers    Hyperthyroidism    Hypotension    Morbid obesity ([place])    Myocardial infarction ([place]) 08/2013   Nocturnal dyspnea    Nonischemic cardiomyopathy ([place])    Sinus tachycardia    Sleep apnea    Snoring-prob OSA 09/04/2011   [employer] ICD lead RECALL  6949    Stroke ([place]) 2021  some residual right-sided weakness   UARS (upper airway resistance syndrome) 09/04/2011   HST 12/2013:  AHI 4/hr (numerous episodes of airflow reduction that did not have concomitant desaturation)     Tobacco Use: Social History   Tobacco Use  Smoking Status Never  Smokeless Tobacco Never    Labs: Review Flowsheet  More data exists      Latest Ref Rng & Units 09/18/2021 09/19/2021 09/20/2021 12/24/2021 02/05/2022  Labs for ITP Cardiac and Pulmonary Rehab  PH, Arterial 7.35 - 7.45 - - - - 7.325   PCO2 arterial 32 - 48 mmHg - - - - 49.0   Bicarbonate 20.0 - 28.0 mmol/L - - - - 25.5   TCO2 22 - 32 mmol/L - - - 21  27   Acid-base deficit 0.0 - 2.0 mmol/L - - - - 1.0    O2 Saturation % 63.7  67.5  66.6  - 100      Exercise Target Goals: Exercise Program Goal: Individual exercise prescription set using results from initial 6 min walk test and THRR while considering  patient's activity barriers and safety.   Exercise Prescription Goal: Initial exercise prescription builds to 30-45 minutes a day of aerobic activity, 2-3 days per week.  Home exercise guidelines will be given to patient during program as part of exercise prescription that the participant will acknowledge.   Education: Aerobic Exercise: - Group verbal and visual presentation on the components of exercise prescription. Introduces F.I.T.T principle from ACSM for exercise prescriptions.  Reviews F.I.T.T. principles of aerobic exercise including progression. Written material given at graduation. Flowsheet Row Cardiac Rehab from 10/17/2020 in [facility] Cardiac and Pulmonary Rehab  Date 08/08/20  Educator [facility]  Instruction Review Code 1- Verbalizes Understanding       Education: Resistance Exercise: - Group verbal and visual presentation on the components of exercise prescription. Introduces F.I.T.T principle from ACSM for exercise prescriptions  Reviews F.I.T.T. principles of resistance exercise including progression. Written material given at graduation. Flowsheet Row Cardiac Rehab from 10/17/2020 in [facility] Cardiac and Pulmonary Rehab  Date 10/17/20  Educator [facility]  Instruction Review Code 1- Verbalizes Understanding        Education: Exercise & Equipment Safety: - Individual verbal instruction and demonstration of equipment use and safety with use of the equipment. Flowsheet Row Cardiac Rehab from 11/13/2021 in [facility] Cardiac and Pulmonary Rehab  Education need identified 11/13/21  Date 11/13/21  Educator [place]  Instruction Review Code 1- Verbalizes Understanding       Education: Exercise Physiology & General Exercise Guidelines: - Group verbal and written instruction with models to review the  exercise physiology of the cardiovascular system and associated critical values. Provides general exercise guidelines with specific guidelines to those with heart or lung disease.  Flowsheet Row Cardiac Rehab from 10/17/2020 in [facility] Cardiac and Pulmonary Rehab  Date 10/03/20  Educator [facility]  Instruction Review Code 1- Verbalizes Understanding       Education: Flexibility, Balance, Mind/Body Relaxation: - Group verbal and visual presentation with interactive activity on the components of exercise prescription. Introduces F.I.T.T principle from ACSM for exercise prescriptions. Reviews F.I.T.T. principles of flexibility and balance exercise training including progression. Also discusses the mind body connection.  Reviews various relaxation techniques to help reduce and manage stress (i.e. Deep breathing, progressive muscle relaxation, and visualization). Balance handout provided to take home. Written material given at graduation. Flowsheet Row Cardiac Rehab from 10/17/2020 in [facility] Cardiac and Pulmonary Rehab  Date 08/22/20  Educator [facility]  Instruction  Review Code 1- Verbalizes Understanding       Activity Barriers & Risk Stratification:  Activity Barriers & Cardiac Risk Stratification - 11/13/21 1234       Activity Barriers & Cardiac Risk Stratification   Activity Barriers Deconditioning;Muscular Weakness;Shortness of Breath;Decreased Ventricular Function;Other (comment)    Comments LVAD    Cardiac Risk Stratification High             6 Minute Walk:  6 Minute Walk     Row Name 11/13/21 1245         6 Minute Walk   Phase Initial     Distance 985 feet     Walk Time 6 minutes     # of Rest Breaks 0     MPH 1.86     METS 2.68     RPE 10     Perceived Dyspnea  1     VO2 Peak 9.41     Symptoms Yes (comment)     Comments Slight SOB, unsteadiness with balance     Resting HR 81 bpm     Resting BP 103/64  automated BP cuff     Resting Oxygen Saturation  97 %     Exercise Oxygen  Saturation  during 6 min walk 96 %     Max Ex. HR 105 bpm     Max Ex. BP 112/83  automated BP cuff     2 Minute Post BP 85/74  automated BP cuff; On entresto and denied dizziness/lightheadedness              Oxygen Initial Assessment:   Oxygen Re-Evaluation:   Oxygen Discharge (Final Oxygen Re-Evaluation):   Initial Exercise Prescription:  Initial Exercise Prescription - 11/13/21 1200       Date of Initial Exercise RX and Referring Provider   Date 11/13/21    Referring Provider [name] MD      Oxygen   Maintain Oxygen Saturation 88% or higher      Recumbant Bike   Level 1    RPM 60    Watts 22    Minutes 15    METs 2.6      NuStep   Level 1    SPM 80    Minutes 15    METs 2.6      REL-XR   Level 1    Speed 50    Minutes 15    METs 2.6      Track   Laps 21    Minutes 15    METs 2.14      Prescription Details   Frequency (times per week) 3    Duration Progress to 30 minutes of continuous aerobic without signs/symptoms of physical distress      Intensity   THRR 40-80% of Max Heartrate 116- 152    Ratings of Perceived Exertion 11-13    Perceived Dyspnea 0-4      Progression   Progression Continue to progress workloads to maintain intensity without signs/symptoms of physical distress.      Resistance Training   Training Prescription Yes    Weight 3 lb    Reps 10-15             Perform Capillary Blood Glucose checks as needed.  Exercise Prescription Changes:   Exercise Prescription Changes     Row Name 11/13/21 1200 11/20/21 1400 12/02/21 1600 12/16/21 0800 12/30/21 0900     Response to Exercise   Blood Pressure (Admit) 103/64  automated BP cuff '93/81 91/56 93/80 ' 103/57   Blood Pressure (Exercise) 112/83  automated BP cuff --  76 Doppler 107/39 114/82 --   Blood Pressure (Exit) 85/74  automated BP cuff; On entresto and denied dizziness/lightheadedness --  72 Doppler 116/83 114/82 78/65   Heart Rate (Admit) 71 bpm 91 bpm 103  bpm 92 bpm 100 bpm   Heart Rate (Exercise) 105 bpm 116 bpm 113 bpm 113 bpm 118 bpm   Heart Rate (Exit) 80 bpm 98 bpm 90 bpm 83 bpm 100 bpm   Oxygen Saturation (Admit) 97 % -- -- -- --   Oxygen Saturation (Exercise) 96 % -- -- -- --   Oxygen Saturation (Exit) 97 % -- -- -- --   Rating of Perceived Exertion (Exercise) '10 13 13 14 13   ' Perceived Dyspnea (Exercise) 1 -- -- -- --   Symptoms Slight SOB, unsteady gait -- SOB none none   Comments walk test results Today was her first day at Rehab -- -- --   Duration -- -- Continue with 30 min of aerobic exercise without signs/symptoms of physical distress. Continue with 30 min of aerobic exercise without signs/symptoms of physical distress. Continue with 30 min of aerobic exercise without signs/symptoms of physical distress.   Intensity -- -- THRR unchanged THRR unchanged THRR unchanged     Progression   Progression -- -- Continue to progress workloads to maintain intensity without signs/symptoms of physical distress. Continue to progress workloads to maintain intensity without signs/symptoms of physical distress. Continue to progress workloads to maintain intensity without signs/symptoms of physical distress.   Average METs -- -- 2.07 2.47 2.72     Resistance Training   Training Prescription -- -- Yes Yes Yes   Weight -- -- 3 lb 3 lb 3 lb   Reps -- -- 10-15 10-15 10-15     Interval Training   Interval Training -- -- No No No     Treadmill   MPH -- -- -- 1.5 1.7   Grade -- -- -- 0 2   Minutes -- -- -- 15 15   METs -- -- -- 2.15 2.77     Recumbant Bike   Level -- -- 1.'5 1 2   ' Watts -- -- '16 20 19   ' Minutes -- -- '15 15 15   ' METs -- -- 2.45 2.59 2.6     NuStep   Level -- -- 1 5 --   Minutes -- -- 15 15 --   METs -- -- 2.5 2.8 --     REL-XR   Level -- -- '1 2 2   ' Minutes -- -- '15 15 15   ' METs -- -- 1.4 3.1 3.1     T5 Nustep   Level -- -- 3 -- --   Minutes -- -- 15 -- --   METs -- -- 1.9 -- --     Track   Laps -- -- 20 -- --    Minutes -- -- 15 -- --   METs -- -- 2.09 -- --     Home Exercise Plan   Plans to continue exercise at -- -- -- -- Home (comment)  walking, treadmill, staff videos   Frequency -- -- -- -- Add 2 additional days to program exercise sessions.   Initial Home Exercises Provided -- -- -- -- 12/18/21     Oxygen   Maintain Oxygen Saturation -- -- 88% or higher 88% or higher 88% or higher    Row Name  01/13/22 1500 01/27/22 0900 02/10/22 1000         Response to Exercise   Blood Pressure (Admit) 98/70 107/89 110/79     Blood Pressure (Exit) 106/84 109/73 108/87     Heart Rate (Admit) 94 bpm 59 bpm 86 bpm     Heart Rate (Exercise) 133 bpm 118 bpm 126 bpm     Heart Rate (Exit) 84 bpm 70 bpm 71 bpm     Rating of Perceived Exertion (Exercise) '13 13 15     ' Symptoms none none none     Duration Continue with 30 min of aerobic exercise without signs/symptoms of physical distress. Continue with 30 min of aerobic exercise without signs/symptoms of physical distress. Continue with 30 min of aerobic exercise without signs/symptoms of physical distress.     Intensity THRR unchanged THRR unchanged THRR unchanged       Progression   Progression Continue to progress workloads to maintain intensity without signs/symptoms of physical distress. Continue to progress workloads to maintain intensity without signs/symptoms of physical distress. Continue to progress workloads to maintain intensity without signs/symptoms of physical distress.     Average METs 2.82 2.96 2.59       Resistance Training   Training Prescription Yes Yes Yes     Weight 3 lb 3 lb 3 lb     Reps 10-15 10-15 10-15       Interval Training   Interval Training No No Yes     Equipment -- -- Treadmill     Comments -- -- intervals of speed from 2-2.5 mph       Treadmill   MPH 2 2 2.5     Grade '1 1 1     ' Minutes '15 15 15     ' METs 2.81 2.81 3.26       Recumbant Bike   Level '2 2 2     ' Watts 23 23 --     Minutes '15 15 15     ' METs 2.67 2.66  2.9       NuStep   Level '6 4 6     ' Minutes '15 15 15     ' METs 3.3 3.1 3.1       REL-XR   Level '3 6 4     ' Minutes '15 15 15     ' METs 3 4.2 1.9       Track   Laps -- 27 --     Minutes -- 15 --     METs -- 2.47 --       Home Exercise Plan   Plans to continue exercise at Home (comment)  walking, treadmill, staff videos Home (comment)  walking, treadmill, staff videos Home (comment)  walking, treadmill, staff videos     Frequency Add 2 additional days to program exercise sessions. Add 2 additional days to program exercise sessions. Add 2 additional days to program exercise sessions.     Initial Home Exercises Provided 12/18/21 12/18/21 12/18/21       Oxygen   Maintain Oxygen Saturation 88% or higher 88% or higher 88% or higher              Exercise Comments:   Exercise Comments     Row Name 11/20/21 1002           Exercise Comments First full day of exercise!  Patient was oriented to gym and equipment including functions, settings, policies, and procedures.  Patient's individual exercise prescription and treatment plan were reviewed.  All starting workloads were established based on the results of the 6 minute walk test done at initial orientation visit.  The plan for exercise progression was also introduced and progression will be customized based on patient's performance and goals.                Exercise Goals and Review:   Exercise Goals     Row Name 11/13/21 1258             Exercise Goals   Increase Physical Activity Yes       Intervention Provide advice, education, support and counseling about physical activity/exercise needs.;Develop an individualized exercise prescription for aerobic and resistive training based on initial evaluation findings, risk stratification, comorbidities and participant's personal goals.       Expected Outcomes Short Term: Attend rehab on a regular basis to increase amount of physical activity.;Long Term: Add in home exercise to  make exercise part of routine and to increase amount of physical activity.;Long Term: Exercising regularly at least 3-5 days a week.       Increase Strength and Stamina Yes       Intervention Provide advice, education, support and counseling about physical activity/exercise needs.;Develop an individualized exercise prescription for aerobic and resistive training based on initial evaluation findings, risk stratification, comorbidities and participant's personal goals.       Expected Outcomes Short Term: Increase workloads from initial exercise prescription for resistance, speed, and METs.;Short Term: Perform resistance training exercises routinely during rehab and add in resistance training at home;Long Term: Improve cardiorespiratory fitness, muscular endurance and strength as measured by increased METs and functional capacity (6MWT)       Able to understand and use rate of perceived exertion (RPE) scale Yes       Intervention Provide education and explanation on how to use RPE scale       Expected Outcomes Short Term: Able to use RPE daily in rehab to express subjective intensity level;Long Term:  Able to use RPE to guide intensity level when exercising independently       Able to understand and use Dyspnea scale Yes       Intervention Provide education and explanation on how to use Dyspnea scale       Expected Outcomes Short Term: Able to use Dyspnea scale daily in rehab to express subjective sense of shortness of breath during exertion;Long Term: Able to use Dyspnea scale to guide intensity level when exercising independently       Knowledge and understanding of Target Heart Rate Range (THRR) Yes       Intervention Provide education and explanation of THRR including how the numbers were predicted and where they are located for reference       Expected Outcomes Short Term: Able to state/look up THRR;Short Term: Able to use daily as guideline for intensity in rehab;Long Term: Able to use THRR to govern  intensity when exercising independently       Able to check pulse independently Yes       Intervention Provide education and demonstration on how to check pulse in carotid and radial arteries.;Review the importance of being able to check your own pulse for safety during independent exercise       Expected Outcomes Short Term: Able to explain why pulse checking is important during independent exercise;Long Term: Able to check pulse independently and accurately       Understanding of Exercise Prescription Yes       Intervention Provide education,  explanation, and written materials on patient's individual exercise prescription       Expected Outcomes Short Term: Able to explain program exercise prescription;Long Term: Able to explain home exercise prescription to exercise independently                Exercise Goals Re-Evaluation :  Exercise Goals Re-Evaluation     Row Name 11/20/21 1002 11/20/21 1409 12/02/21 1600 12/16/21 0830 12/18/21 1007     Exercise Goal Re-Evaluation   Exercise Goals Review Increase Physical Activity;Able to understand and use rate of perceived exertion (RPE) scale;Knowledge and understanding of Target Heart Rate Range (THRR);Understanding of Exercise Prescription;Increase Strength and Stamina;Able to understand and use Dyspnea scale;Able to check pulse independently Increase Physical Activity;Understanding of Exercise Prescription;Increase Strength and Stamina Increase Physical Activity;Understanding of Exercise Prescription;Increase Strength and Stamina Increase Physical Activity;Understanding of Exercise Prescription;Increase Strength and Stamina Increase Physical Activity;Understanding of Exercise Prescription;Increase Strength and Stamina;Able to understand and use rate of perceived exertion (RPE) scale;Able to understand and use Dyspnea scale;Knowledge and understanding of Target Heart Rate Range (THRR);Able to check pulse independently   Comments Reviewed RPE and  dyspnea scales, THR and program prescription with pt today.  Pt voiced understanding and was given a copy of goals to take home. [name] did well on her first day in the program. Pt will benefit from continuing to attend rehab. [name] is off to a great start in rehab.  Some days we have been able to use automatic cuff and some days need to use dopplar for blood pressures.  She has only attended three full sessions as she had to miss a few appointments due to an alarm on her VAD but was found to be to dry and meds were adjusted.  She is up to level 1.5 on the bike and level 3 on T5 NuStep. We will conitnue to monitor her progress. [name] is doing well in rehab. She has done well on the treadmill at a speed of 1.5 mph. She also increased to level 5 on the T4 machine. [name] has tolerated using 3 lb hand weights for resistance training as well. We will continue to monitor her progress in the program. Updated home exercise with pt today.  Pt plans to walk and use treadmill and bike at home for exercise.  Reviewed THR, pulse, RPE, sign and symptoms, pulse oximetery and when to call 911 or MD.  Also review LVAD parameters and reminded of precautions for it with exercise at home.  Also discussed weather considerations and indoor options.  Pt voiced understanding.   Expected Outcomes Short: Use RPE daily to regulate intensity. Long: Follow program prescription in THR. Short: Use RPE daily to regulate intensity. Long: Follow program prescription in THR. Short: Continue ot attend rehab regularly Long: Continue to follow program prescription Short: Continue to increase laps on the track Long: Continue to improve strength and stamina. Short: Start to add exercise back in at home Long: Continue to rebuild stamina.    [place] Name 12/30/21 [id number] 01/13/22 1519 01/15/22 1012 01/27/22 0946 02/10/22 1019     Exercise Goal Re-Evaluation   Exercise Goals Review Increase Physical Activity;Increase Strength and Stamina;Understanding of  Exercise Prescription Increase Physical Activity;Increase Strength and Stamina;Understanding of Exercise Prescription Increase Physical Activity;Increase Strength and Stamina;Understanding of Exercise Prescription Increase Physical Activity;Increase Strength and Stamina;Understanding of Exercise Prescription Increase Physical Activity;Increase Strength and Stamina;Understanding of Exercise Prescription   Comments [name] has been doing well in rehab.  She missed part of last week by  needing iron infusions and med adjustments.  She has worked her way up to 2% grade on the treadmill.  We will encouarge her to try 4 lb weights and continue to montior her progress. [name] continues to do well in rehab. She has increased her levels on both the recumbent bike and T4 Nustep. RPEs are in appropriate range. She would benefit from using 4 lbs for handweights. Will continue to monitor. [name] is doing well in rehab.  She is walking on the treadmill at home for 20-30 min each day or go to the stores to walk.  She feels likes she is recovering her strength and stamina.  She has noticed a big change since she started rehab.  When she started, she was not able to walk up her stairs and now she is able to go up them without a problem. [name] continues to do well in rehab. She recently improved her overall average MET level to 2.96 METs. She increased her laps on the track to 27 laps as well. She also improved from level 3 to level 6 on the XR. We will continue to monitor [name]'s progress in the program. [name] continues to do well in rehab. She has consistently kept her average MET level above 2.5 METs. She also improved to level 6 on the T4 machine. She also has been doing intervals of speed on the treadmill from 2-2.5 mph with an incline of 1%. We will continue to monitor her progress in the program.   Expected Outcomes Short: Try 4 lb hand weights once back to exercise again Long; Continue to build stamina Short: Increase to 4 lbs  for handweights Long: Continue to increase overall MET level Short: Continue to use treadmill at home Long; Continue to improve stamina Short: Continue to increase workload on the treadmill. Long: Continue to improve strength and stamina. Short: Continue to increase workload on the treadmill. Long: Continue to increase overall MET levels.            Discharge Exercise Prescription (Final Exercise Prescription Changes):  Exercise Prescription Changes - 02/10/22 1000       Response to Exercise   Blood Pressure (Admit) 110/79    Blood Pressure (Exit) 108/87    Heart Rate (Admit) 86 bpm    Heart Rate (Exercise) 126 bpm    Heart Rate (Exit) 71 bpm    Rating of Perceived Exertion (Exercise) 15    Symptoms none    Duration Continue with 30 min of aerobic exercise without signs/symptoms of physical distress.    Intensity THRR unchanged      Progression   Progression Continue to progress workloads to maintain intensity without signs/symptoms of physical distress.    Average METs 2.59      Resistance Training   Training Prescription Yes    Weight 3 lb    Reps 10-15      Interval Training   Interval Training Yes    Equipment Treadmill    Comments intervals of speed from 2-2.5 mph      Treadmill   MPH 2.5    Grade 1    Minutes 15    METs 3.26      Recumbant Bike   Level 2    Minutes 15    METs 2.9      NuStep   Level 6    Minutes 15    METs 3.1      REL-XR   Level 4    Minutes 15  METs 1.9      Home Exercise Plan   Plans to continue exercise at Home (comment)   walking, treadmill, staff videos   Frequency Add 2 additional days to program exercise sessions.    Initial Home Exercises Provided 12/18/21      Oxygen   Maintain Oxygen Saturation 88% or higher             Nutrition:  Target Goals: Understanding of nutrition guidelines, daily intake of sodium <1590m, cholesterol <2067m calories 30% from fat and 7% or less from saturated fats, daily to have 5  or more servings of fruits and vegetables.  Education: All About Nutrition: -Group instruction provided by verbal, written material, interactive activities, discussions, models, and posters to present general guidelines for heart healthy nutrition including fat, fiber, MyPlate, the role of sodium in heart healthy nutrition, utilization of the nutrition label, and utilization of this knowledge for meal planning. Follow up email sent as well. Written material given at graduation. [facility]  Education need identified 11/13/21       Biometrics:  Pre Biometrics - 11/13/21 1234       Pre Biometrics   Height '5\' 4"'  (1.626 m)    Weight 230 lb 12.8 oz (104.7 kg)   Weighed with LVAD   BMI (Calculated) 39.6    Single Leg Stand 30 seconds              Nutrition Therapy Plan and Nutrition Goals:  Nutrition Therapy & Goals - 12/09/21 0912       Nutrition Therapy   Diet Heart healthy, low Na    Drug/Food Interactions Statins/Certain Fruits;Coumadin/Vit K    Protein (specify units) 100g    Fiber 25 grams    Whole Grain Foods 3 servings    Saturated Fats 12 max. grams    Fruits and Vegetables 8 servings/day    Sodium 1.5 grams      Personal Nutrition Goals   Nutrition Goal ST: limit sugar-sweetened beverages, try citris or vinegar with meals to help with flavor, include vitamin K rich food consistently - work with health care team LT: continue with mechanical eating to meet nutritional needs, follow MyPlate guidelines for meals, limit added sugar <24g/day    Comments [age].o. F admitted to cardiac rehab  for chronic combined systolic and diastolic HF. PMHx includes hypotension, CVA (2021), NICM EF 20-25%, a.fib, UARS, hyperthyroidism, LVAD (08/2021), moderate malnutrition (08/2021), MI (2015). Relevant medications includes methimazole, crestor, prednisone, warfarin, zinc, torsemide PRN. PYP Score: 70. Vegetables & Fruits 7/12.  Breads, Grains & Cereals 8/12. Red & Processed Meat 9/12. Poultry 2/2. Fish & Shellfish 2/4. Beans, Nuts & Seeds 3/4. Milk & Dairy Foods 6/6. Toppings, Oils, Seasonings & Salt 13/20. Sweets, Snacks & Restaurant Food 12/14. Beverages 8/10.  Breakfast: 1 egg with 1.5 pieces of bacon or tu[place]acon Snacks: almonds, skinny pop, watermelon Lunch: same as dinner Snacks: almonds, skinny pop, watermelon Dinner: pork tenderloin, chicken breast, occasional steak - cook to have for the week. They have a garden so she will eat vegetables from her garden - she is limiting her vitamin K rich food. She will cook with some butter but mostly she will use pam spray. She rarely has fried foods, but will sometimes have fried chicken. She does not salt her food. Drinks: lemonade, water, OJ, sometimes cranberry-watermelon juice. She reports that she is practicing mechanical eating as she is not feeling her hunger  cues. She is also struggling with a metallic taste in her mouth which she reports is getting better. Encouraged to have protein rich foods at each meal and spoke to her higher protein needs. Encouraged her to limit her sugar sweetened beverages and include more nutrient dense foods such as peanut butter as this is something she enjoys. She would like to lose weight - discussed how it is important she meet her nutriitonal needs to ensure proper nutrition and limit muscle loss; also discussed the body's compensatory mechanisms. She reorts limiting vitamin K rich food, discussed updated recommendations to include these foods consistently and in similar amounts and to work with health care team. Reviewed heart healthy eating.      Intervention Plan   Intervention Prescribe, educate and counsel regarding individualized specific dietary modifications aiming towards targeted core components such as weight, hypertension, lipid management, diabetes, heart failure and other comorbidities.    Expected Outcomes Short Term Goal:  Understand basic principles of dietary content, such as calories, fat, sodium, cholesterol and nutrients.;Short Term Goal: A plan has been developed with personal nutrition goals set during dietitian appointment.;Long Term Goal: Adherence to prescribed nutrition plan.             Nutrition Assessments:  MEDIFICTS Score Key: ?70 Need to make dietary changes  40-70 Heart Healthy Diet ? 40 Therapeutic Level Cholesterol Diet  Flowsheet Row Cardiac Rehab from 11/13/2021 in [facility] Cardiac and Pulmonary Rehab  Picture Your Plate Total Score on Admission 70      Picture Your Plate Scores: <84 Unhealthy dietary pattern with much room for improvement. 41-50 Dietary pattern unlikely to meet recommendations for good health and room for improvement. 51-60 More healthful dietary pattern, with some room for improvement.  >60 Healthy dietary pattern, although there may be some specific behaviors that could be improved.    Nutrition Goals Re-Evaluation:  Nutrition Goals Re-Evaluation     [place] Name 01/15/22 1022             Goals   Nutrition Goal ST: limit sugar-sweetened beverages, try citris or vinegar with meals to help with flavor, include vitamin K rich food consistently - work with health care team LT: continue with mechanical eating to meet nutritional needs, follow MyPlate guidelines for meals, limit added sugar <24g/day       Comment [name] is doing well with her diet.  Her weight went up some but she is hoping it will start to come down again.  She has cut back on sweeted drinks.  She is using diet cranberry juice and we talked about diluting it more too.  She also bought a water diffuser and using lemon and mint to flavor.  She is working on adding in more vitamin K foods daily to try to help maintain her INR levels.  She is still eating becuase she has too as she doesn't have an appetite.  She still gets the metallic taste from some food and will use plastic utensils to help as well.  She  eats when its time to eat just because she knkows she is supposed to eat.       Expected Outcome Short: Continue to work on weight loss and vitamin K food Long: Continue to eat to eat                Nutrition Goals Discharge (Final Nutrition Goals Re-Evaluation):  Nutrition Goals Re-Evaluation - 01/15/22 1022       Goals   Nutrition Goal ST:  limit sugar-sweetened beverages, try citris or vinegar with meals to help with flavor, include vitamin K rich food consistently - work with health care team LT: continue with mechanical eating to meet nutritional needs, follow MyPlate guidelines for meals, limit added sugar <24g/day    Comment [name] is doing well with her diet.  Her weight went up some but she is hoping it will start to come down again.  She has cut back on sweeted drinks.  She is using diet cranberry juice and we talked about diluting it more too.  She also bought a water diffuser and using lemon and mint to flavor.  She is working on adding in more vitamin K foods daily to try to help maintain her INR levels.  She is still eating becuase she has too as she doesn't have an appetite.  She still gets the metallic taste from some food and will use plastic utensils to help as well.  She eats when its time to eat just because she knkows she is supposed to eat.    Expected Outcome Short: Continue to work on weight loss and vitamin K food Long: Continue to eat to eat             Psychosocial: Target Goals: Acknowledge presence or absence of significant depression and/or stress, maximize coping skills, provide positive support system. Participant is able to verbalize types and ability to use techniques and skills needed for reducing stress and depression.   Education: Stress, Anxiety, and Depression - Group verbal and visual presentation to define topics covered.  Reviews how body is impacted by stress, anxiety, and depression.  Also discusses healthy ways to reduce stress and to  treat/manage anxiety and depression.  Written material given at graduation. Flowsheet Row Cardiac Rehab from 10/17/2020 in [facility] Cardiac and Pulmonary Rehab  Education need identified 08/06/20  Date 09/26/20  Educator [facility]  Instruction Review Code 1- [employer] Understanding       Education: Sleep Hygiene -Provides group verbal and written instruction about how sleep can affect your health.  Define sleep hygiene, discuss sleep cycles and impact of sleep habits. Review good sleep hygiene tips.    Initial Review & Psychosocial Screening:  Initial Psych Review & Screening - 11/01/21 1410       Initial Review   Current issues with None Identified      Family Dynamics   Good Support System? Yes   husband and 1 and 23 years children     Barriers   Psychosocial barriers to participate in program There are no identifiable barriers or psychosocial needs.      Screening Interventions   Interventions Encouraged to exercise    Expected Outcomes Short Term goal: Utilizing psychosocial counselor, staff and physician to assist with identification of specific Stressors or current issues interfering with healing process. Setting desired goal for each stressor or current issue identified.;Long Term Goal: Stressors or current issues are controlled or eliminated.;Short Term goal: Identification and review with participant of any Quality of Life or Depression concerns found by scoring the questionnaire.;Long Term goal: The participant improves quality of Life and PHQ9 Scores as seen by post scores and/or verbalization of changes             Quality of Life Scores:   Quality of Life - 11/13/21 1230       Quality of Life   Select Quality of Life      Quality of Life Scores   Health/Function Pre 27.57 %  Socioeconomic Pre 29.64 %    Psych/Spiritual Pre 30 %    Family Pre 30 %    GLOBAL Pre 28.85 %            Scores of 19 and below usually indicate a poorer quality of life in these  areas.  A difference of  2-3 points is a clinically meaningful difference.  A difference of 2-3 points in the total score of the Quality of Life Index has been associated with significant improvement in overall quality of life, self-image, physical symptoms, and general health in studies assessing change in quality of life.  PHQ-9: Review Flowsheet  More data exists      11/13/2021 09/03/2020 08/06/2020 10/05/2017 11/27/2014  Depression screen PHQ 2/9  Decreased Interest 0 0 0 1 0  Down, Depressed, Hopeless 0 0 0 0 0  PHQ - 2 Score 0 0 0 1 0  Altered sleeping 0 '1 1 1 ' -  Tired, decreased energy '1 1 2 2 ' -  Change in appetite 1 0 1 - -  Feeling bad or failure about yourself  0 0 0 1 -  Trouble concentrating 0 0 0 0 -  Moving slowly or fidgety/restless 0 0 1 0 -  Suicidal thoughts 0 0 0 0 -  PHQ-9 Score '2 2 5 5 ' -  Difficult doing work/chores Not difficult at all Not difficult at all Not difficult at all Somewhat difficult -   Interpretation of Total Score  Total Score Depression Severity:  1-4 = Minimal depression, 5-9 = Mild depression, 10-14 = Moderate depression, 15-19 = Moderately severe depression, 20-27 = Severe depression   Psychosocial Evaluation and Intervention:  Psychosocial Evaluation - 11/01/21 1419       Psychosocial Evaluation & Interventions   Interventions Encouraged to exercise with the program and follow exercise prescription    Comments [name] is returning to the program after LVAD insertion. She has no barriers to attending. She has become deconditioned after a long period in the hospital and recovering. She has lost weight and wants to lose more weight. She is trying to increase her protein intake and wants to talk with Melissa,our RD.  [name] is ready to get started.    Expected Outcomes STG Cai ia able to attend all sechedules sessions. She is able to work with our RD and find ways to increase her protein intake without gaining weight if possible. LTG [name] is able to  continue to progress with er exercise and weight loss goals    Continue Psychosocial Services  Follow up required by staff             Psychosocial Re-Evaluation:  Psychosocial Re-Evaluation     [place] Name 12/18/21 1008 01/15/22 1019           Psychosocial Re-Evaluation   Current issues with Current Stress Concerns;Current Sleep Concerns Current Stress Concerns;Current Sleep Concerns      Comments [name] is doing well in rehab.  She has not been using her CPAP as she does not feel like she stops breathing anymore. She has a sleep study scheduled for 7/10 with Dr. [name].  She feels that before it was from fluid build, but now that she is dryer she feels better.  Her biggest stressor is dealing with LVAD.  She is just happy to be alive and stay out of hospital.  She does have several appointments to keep up with, but just focuses on the positives. [name] is doing well in rehab.  She continues to feel happy to alive each day and chooses to focus on the positive.  She had her sleep study done and they did not tell her to use CPAP but no one has called her about the results.  They want her to redo the test because of her pulse oximeter readings and restless leg syndrome causing her to jump.  She did not have apenic events, so shes happy about not having to use her CPAP.  She is doing well with her VAD and keeping close eye on wound to heal.      Expected Outcomes Short: Get sleep study Long: Continue to focus on the positive Short: Continue to follow up on sleep study Long: Continue to focus on positive      Interventions Encouraged to attend Cardiac Rehabilitation for the exercise;Stress management education Encouraged to attend Cardiac Rehabilitation for the exercise;Stress management education      Continue Psychosocial Services  -- Follow up required by staff               Psychosocial Discharge (Final Psychosocial Re-Evaluation):  Psychosocial Re-Evaluation - 01/15/22 1019        Psychosocial Re-Evaluation   Current issues with Current Stress Concerns;Current Sleep Concerns    Comments [name] is doing well in rehab.  She continues to feel happy to alive each day and chooses to focus on the positive.  She had her sleep study done and they did not tell her to use CPAP but no one has called her about the results.  They want her to redo the test because of her pulse oximeter readings and restless leg syndrome causing her to jump.  She did not have apenic events, so shes happy about not having to use her CPAP.  She is doing well with her VAD and keeping close eye on wound to heal.    Expected Outcomes Short: Continue to follow up on sleep study Long: Continue to focus on positive    Interventions Encouraged to attend Cardiac Rehabilitation for the exercise;Stress management education    Continue Psychosocial Services  Follow up required by staff             Vocational Rehabilitation: Provide vocational rehab assistance to qualifying candidates.   Vocational Rehab Evaluation & Intervention:   Education: Education Goals: Education classes will be provided on a variety of topics geared toward better understanding of heart health and risk factor modification. Participant will state understanding/return demonstration of topics presented as noted by education test scores.  Learning Barriers/Preferences:   General Cardiac Education Topics:  AED/CPR: - Group verbal and written instruction with the use of models to demonstrate the basic use of the AED with the basic ABC's of resuscitation.   Anatomy and Cardiac Procedures: - Group verbal and visual presentation and models provide information about basic cardiac anatomy and function. Reviews the testing methods done to diagnose heart disease and the outcomes of the test results. Describes the treatment choices: Medical Management, Angioplasty, or Coronary Bypass Surgery for treating various heart conditions including  Myocardial Infarction, Angina, Valve Disease, and Cardiac Arrhythmias.  Written material given at graduation. Flowsheet Row Cardiac Rehab from 11/13/2021 in [facility] Cardiac and Pulmonary Rehab  Education need identified 11/13/21       Medication Safety: - Group verbal and visual instruction to review commonly prescribed medications for heart and lung disease. Reviews the medication, class of the drug, and side effects. Includes the steps to properly store meds and maintain the  prescription regimen.  Written material given at graduation. Flowsheet Row Cardiac Rehab from 10/17/2020 in [facility] Cardiac and Pulmonary Rehab  Date 09/05/20  Educator SB  Instruction Review Code 1- Verbalizes Understanding       Intimacy: - Group verbal instruction through game format to discuss how heart and lung disease can affect sexual intimacy. Written material given at graduation.. Flowsheet Row Cardiac Rehab from 10/17/2020 in [facility] Cardiac and Pulmonary Rehab  Date 08/08/20  Educator [facility]  Instruction Review Code 1- Verbalizes Understanding       Know Your Numbers and Heart Failure: - Group verbal and visual instruction to discuss disease risk factors for cardiac and pulmonary disease and treatment options.  Reviews associated critical values for Overweight/Obesity, Hypertension, Cholesterol, and Diabetes.  Discusses basics of heart failure: signs/symptoms and treatments.  Introduces Heart Failure Zone chart for action plan for heart failure.  Written material given at graduation. Flowsheet Row Cardiac Rehab from 11/13/2021 in [facility] Cardiac and Pulmonary Rehab  Education need identified 11/13/21       Infection Prevention: - Provides verbal and written material to individual with discussion of infection control including proper hand washing and proper equipment cleaning during exercise session. Flowsheet Row Cardiac Rehab from 11/13/2021 in [facility] Cardiac and Pulmonary Rehab  Education need identified 11/13/21   Date 11/13/21  Educator [place]  Instruction Review Code 1- Verbalizes Understanding       Falls Prevention: - Provides verbal and written material to individual with discussion of falls prevention and safety. Flowsheet Row Cardiac Rehab from 11/13/2021 in [facility] Rehab  Education need identified 11/13/21  Date 11/13/21  Educator [place]  Instruction Review Code 1- Verbalizes Understanding       Other: -Provides group and verbal instruction on various topics (see comments)   Knowledge Questionnaire Score:  Knowledge Questionnaire Score - 11/13/21 1229       Knowledge Questionnaire Score   Pre Score 23/26             Core Components/Risk Factors/Patient Goals at Admission:  Personal Goals and Risk Factors at Admission - 11/13/21 1258       Core Components/Risk Factors/Patient Goals on Admission    Weight Management Yes;Weight Loss;Obesity    Intervention Weight Management: Develop a combined nutrition and exercise program designed to reach desired caloric intake, while maintaining appropriate intake of nutrient and fiber, sodium and fats, and appropriate energy expenditure required for the weight goal.;Weight Management/Obesity: Establish reasonable short term and long term weight goals.    Admit Weight 230 lb 12.8 oz (104.7 kg)   weighed with LVAD & bag   Goal Weight: Short Term 225 lb (102.1 kg)    Goal Weight: Long Term 200 lb (90.7 kg)    Expected Outcomes Short Term: Continue to assess and modify interventions until short term weight is achieved;Long Term: Adherence to nutrition and physical activity/exercise program aimed toward attainment of established weight goal;Weight Loss: Understanding of general recommendations for a balanced deficit meal plan, which promotes 1-2 lb weight loss per week and includes a negative energy balance of [phone] kcal/d;Understanding recommendations for meals to include 15-35% energy as protein, 25-35% energy from fat, 35-60%  energy from carbohydrates, less than 23m of dietary cholesterol, 20-35 gm of total fiber daily;Understanding of distribution of calorie intake throughout the day with the consumption of 4-5 meals/snacks    Heart Failure Yes    Intervention Provide a combined exercise and nutrition program that is supplemented with education, support and counseling  about heart failure. Directed toward relieving symptoms such as shortness of breath, decreased exercise tolerance, and extremity edema.    Expected Outcomes Improve functional capacity of life;Short term: Attendance in program 2-3 days a week with increased exercise capacity. Reported lower sodium intake. Reported increased fruit and vegetable intake. Reports medication compliance.;Short term: Daily weights obtained and reported for increase. Utilizing diuretic protocols set by physician.;Long term: Adoption of self-care skills and reduction of barriers for early signs and symptoms recognition and intervention leading to self-care maintenance.    Hypertension Yes    Intervention Provide education on lifestyle modifcations including regular physical activity/exercise, weight management, moderate sodium restriction and increased consumption of fresh fruit, vegetables, and low fat dairy, alcohol moderation, and smoking cessation.;Monitor prescription use compliance.    Expected Outcomes Short Term: Continued assessment and intervention until BP is < 140/44m HG in hypertensive participants. < 130/871mHG in hypertensive participants with diabetes, heart failure or chronic kidney disease.;Long Term: Maintenance of blood pressure at goal levels.    Lipids Yes    Intervention Provide education and support for participant on nutrition & aerobic/resistive exercise along with prescribed medications to achieve LDL <7024mHDL >44m16m  Expected Outcomes Short Term: Participant states understanding of desired cholesterol values and is compliant with medications prescribed.  Participant is following exercise prescription and nutrition guidelines.;Long Term: Cholesterol controlled with medications as prescribed, with individualized exercise RX and with personalized nutrition plan. Value goals: LDL < 70mg15mL > 40 mg.             Education:Diabetes - Individual verbal and written instruction to review signs/symptoms of diabetes, desired ranges of glucose level fasting, after meals and with exercise. Acknowledge that pre and post exercise glucose checks will be done for 3 sessions at entry of program.   Core Components/Risk Factors/Patient Goals Review:   Goals and Risk Factor Review     Row Name 12/18/21 1012 01/15/22 1027           Core Components/Risk Factors/Patient Goals Review   Personal Goals Review Heart Failure;Lipids;Weight Management/Obesity;Hypertension Heart Failure;Lipids;Weight Management/Obesity;Hypertension      Review AnitaDonelloing well in rehab.  Her weight is starting to go up some now that she is eating again, but she is working closely with dietitan to keep close eye on it.  Now that she can eat, she will start to focus on heart healthy eating again. She is good about checking her LVAD and staying on top of her fluid levels.  She has not had any heart failure symptoms.  Her pressures are staying steady both in class and at home. AnitaTamairaoing well in rehab.  Her weight is starting to come down some.  Her only heart failure symptom is getting SOB when it is hot outside, so she tries to stay inside on those days.  Her pressures are staying steady and still detectable with automatic cuff.  She is doing well on her medications.      Expected Outcomes -- short: Continue to work on weight loss Long: Continue to manage HF/VAD               Core Components/Risk Factors/Patient Goals at Discharge (Final Review):   Goals and Risk Factor Review - 01/15/22 1027       Core Components/Risk Factors/Patient Goals Review   Personal Goals Review  Heart Failure;Lipids;Weight Management/Obesity;Hypertension    Review AnitaRayenoing well in rehab.  Her weight is starting to come down some.  Her only heart failure symptom is getting SOB when it is hot outside, so she tries to stay inside on those days.  Her pressures are staying steady and still detectable with automatic cuff.  She is doing well on her medications.    Expected Outcomes short: Continue to work on weight loss Long: Continue to manage HF/VAD             ITP Comments:  ITP Comments     Row Name 11/01/21 1424 11/13/21 1223 11/20/21 1002 12/04/21 0731 12/09/21 1015   ITP Comments Virtual orientation call completed today. shehas an appointment on Date: 11/13/2021  for EP eval and gym Orientation.  Documentation of diagnosis can be found in [facility] Date: 08/25/2021 . Completed 6MWT and gym orientation. Initial ITP created and sent for review to Dr. Emily Filbert, Medical Director. First full day of exercise!  Patient was oriented to gym and equipment including functions, settings, policies, and procedures.  Patient's individual exercise prescription and treatment plan were reviewed.  All starting workloads were established based on the results of the 6 minute walk test done at initial orientation visit.  The plan for exercise progression was also introduced and progression will be customized based on patient's performance and goals. 30 Day review completed. Medical Director ITP review done, changes made as directed, and signed approval by Medical Director. Completed initial RD consultation    Row Name 12/30/21 [phone] 01/01/22 0743 01/29/22 0641 02/26/22 0743     ITP Comments Shoshana missed part of last week due to multiple iron infusions in the ED and med adjustments.  She was also feeling washed out on Friday from all her appointments. 30 Day review completed. Medical Director ITP review done, changes made as directed, and signed approval by Medical Director. 30 Day review completed. Medical  Director ITP review done, changes made as directed, and signed approval by Medical Director. 30 Day review completed. Medical Director ITP review done, changes made as directed, and signed approval by Medical Director.             Comments:

## 2022-02-27 ENCOUNTER — Ambulatory Visit (HOSPITAL_COMMUNITY): Payer: Self-pay | Admitting: Pharmacist

## 2022-02-27 LAB — POCT INR: INR: 1.5 — AB (ref 2.0–3.0)

## 2022-02-28 ENCOUNTER — Encounter: Payer: BC Managed Care – PPO | Admitting: *Deleted

## 2022-02-28 ENCOUNTER — Ambulatory Visit (HOSPITAL_COMMUNITY)
Admission: RE | Admit: 2022-02-28 | Discharge: 2022-02-28 | Disposition: A | Discharge status: Home / Self Care | Payer: BC Managed Care – PPO | Source: Ambulatory Visit | Attending: Internal Medicine | Admitting: Internal Medicine

## 2022-02-28 ENCOUNTER — Encounter: Payer: Self-pay | Admitting: *Deleted

## 2022-02-28 DIAGNOSIS — Z48812 Encounter for surgical aftercare following surgery on the circulatory system: Secondary | ICD-10-CM | POA: Diagnosis not present

## 2022-02-28 DIAGNOSIS — I5022 Chronic systolic (congestive) heart failure: Secondary | ICD-10-CM | POA: Insufficient documentation

## 2022-02-28 DIAGNOSIS — Z7901 Long term (current) use of anticoagulants: Secondary | ICD-10-CM | POA: Diagnosis not present

## 2022-02-28 DIAGNOSIS — Z95811 Presence of heart assist device: Secondary | ICD-10-CM

## 2022-02-28 DIAGNOSIS — I5042 Chronic combined systolic (congestive) and diastolic (congestive) heart failure: Secondary | ICD-10-CM | POA: Diagnosis not present

## 2022-02-28 LAB — BASIC METABOLIC PANEL
Anion gap: 9 (ref 5–15)
BUN: 24 mg/dL — ABNORMAL HIGH (ref 6–20)
CO2: 24 mmol/L (ref 22–32)
Calcium: 9.1 mg/dL (ref 8.9–10.3)
Chloride: 103 mmol/L (ref 98–111)
Creatinine, Ser: 1.36 mg/dL — ABNORMAL HIGH (ref 0.44–1.00)
GFR, Estimated: 47 mL/min — ABNORMAL LOW (ref >60)
Glucose, Bld: 92 mg/dL (ref 70–99)
Potassium: 3.7 mmol/L (ref 3.5–5.1)
Sodium: 136 mmol/L (ref 135–145)

## 2022-02-28 NOTE — Progress Notes (Signed)
Scoring post questionnaires

## 2022-02-28 NOTE — Progress Notes (Signed)
Daily Session Note  Patient Details  Name: [name] MRN: [id number] Date of Birth: 03/05/1971 Referring Provider:   Flowsheet Row Cardiac Rehab from 11/13/2021 in [facility] Cardiac and Pulmonary Rehab  Referring Provider [name] MD       Encounter Date: 02/28/2022  Check In:  Session Check In - 02/28/22 0914       Check-In   Supervising physician immediately available to respond to emergencies See telemetry face sheet for immediately available ER MD    Location [facility]-Cardiac & Pulmonary Rehab    Staff Present Heath Lark, RN, BSN, CCRP;Joseph Belvedere, RCP,RRT,BSRT;Jessica Linton Hall, [place], [place], CCRP, CCET    Virtual Visit No    Medication changes reported     No    Fall or balance concerns reported    No    Warm-up and Cool-down Performed on first and last piece of equipment    Resistance Training Performed Yes    VAD Patient? Yes    PAD/SET Patient? No      VAD patient   Has back up controller? Yes    Has spare charged batteries? Yes    Has battery cables? Yes    Has compatible battery clips? Yes      Pain Assessment   Currently in Pain? No/denies                Social History   Tobacco Use  Smoking Status Never  Smokeless Tobacco Never    Goals Met:  Independence with exercise equipment Exercise tolerated well No report of concerns or symptoms today  Goals Unmet:  Not Applicable  Comments: Pt able to follow exercise prescription today without complaint.  Will continue to monitor for progression.    Dr. Emily Filbert is Medical Director for [place].  Dr. Ottie Glazier is Medical Director for [facility] Pulmonary Rehabilitation.

## 2022-03-03 ENCOUNTER — Encounter: Payer: BC Managed Care – PPO | Admitting: *Deleted

## 2022-03-03 DIAGNOSIS — I5042 Chronic combined systolic (congestive) and diastolic (congestive) heart failure: Secondary | ICD-10-CM | POA: Diagnosis not present

## 2022-03-03 DIAGNOSIS — Z95811 Presence of heart assist device: Secondary | ICD-10-CM

## 2022-03-03 DIAGNOSIS — Z48812 Encounter for surgical aftercare following surgery on the circulatory system: Secondary | ICD-10-CM | POA: Diagnosis not present

## 2022-03-03 NOTE — Progress Notes (Signed)
Daily Session Note  Patient Details  Name: [name] MRN: [id number] Date of Birth: July 05, 1970 Referring Provider:   Flowsheet Row Cardiac Rehab from 11/13/2021 in [facility] Cardiac and Pulmonary Rehab  Referring Provider [name] MD       Encounter Date: 03/03/2022  Check In:  Session Check In - 03/03/22 1033       Check-In   Supervising physician immediately available to respond to emergencies See telemetry face sheet for immediately available ER MD    Location [facility]Card[facility] & Pul[facility]    Staff Present Heath Lark, RN, BSN, Laveda Norman, BS, ACSM CEP, Exercise Physiologist;Noah Tickle, BS, Exercise Physiologist    Virtual Visit No    Medication changes reported     No    Fall or balance concerns reported    No    Warm-up and Cool-down Performed on first and last piece of equipment    Resistance Training Performed Yes    VAD Patient? Yes    PAD/SET Patient? No      VAD patient   Has back up controller? Yes    Has spare charged batteries? Yes    Has battery cables? Yes    Has compatible battery clips? Yes      Pain Assessment   Currently in Pain? No/denies                Social History   Tobacco Use  Smoking Status Never  Smokeless Tobacco Never    Goals Met:  Independence with exercise equipment Exercise tolerated well No report of concerns or symptoms today  Goals Unmet:  Not Applicable  Comments: Pt able to follow exercise prescription today without complaint.  Will continue to monitor for progression.    Dr. Emily Filbert is Medical Director for [place].  Dr. Ottie Glazier is Medical Director for [facility] Pulmonary Rehabilitation.

## 2022-03-05 ENCOUNTER — Encounter: Payer: BC Managed Care – PPO | Admitting: *Deleted

## 2022-03-05 DIAGNOSIS — Z95811 Presence of heart assist device: Secondary | ICD-10-CM | POA: Diagnosis not present

## 2022-03-05 DIAGNOSIS — Z48812 Encounter for surgical aftercare following surgery on the circulatory system: Secondary | ICD-10-CM | POA: Diagnosis not present

## 2022-03-05 DIAGNOSIS — I5042 Chronic combined systolic (congestive) and diastolic (congestive) heart failure: Secondary | ICD-10-CM

## 2022-03-05 NOTE — Progress Notes (Signed)
Daily Session Note  Patient Details  Name: [name] MRN: [id number] Date of Birth: [birth year] Referring Provider:   Flowsheet Row Cardiac Rehab from 11/13/2021 in [facility] Cardiac and Pulmonary Rehab  Referring Provider [name] MD       Encounter Date: 03/05/2022  Check In:  Session Check In - 03/05/22 1113       Check-In   Supervising physician immediately available to respond to emergencies See telemetry face sheet for immediately available ER MD    Location [facility]iac & Pulmonary Rehab    Staff Present Heath Lark, RN, BSN, CCRP;Joseph [place], RCP,RRT,BSRT;Other   Kathee Delton, BS, Exercise Physiologist     Darlyne Russian RN ADN   Virtual Visit No    Medication changes reported     No    Fall or balance concerns reported    No    Warm-up and Cool-down Performed on first and last piece of equipment    Resistance Training Performed Yes    VAD Patient? Yes      VAD patient   Has back up controller? Yes    Has spare charged batteries? Yes    Has battery cables? Yes    Has compatible battery clips? Yes      Pain Assessment   Currently in Pain? No/denies                Social History   Tobacco Use  Smoking Status Never  Smokeless Tobacco Never    Goals Met:  Independence with exercise equipment Exercise tolerated well No report of concerns or symptoms today  Goals Unmet:  Not Applicable  Comments: Pt able to follow exercise prescription today without complaint.  Will continue to monitor for progression.    Dr. Emily Filbert is Medical Director for [place].  Dr. Ottie Glazier is Medical Director for [facility] Pulmonary Rehabilitation.

## 2022-03-06 ENCOUNTER — Ambulatory Visit (HOSPITAL_COMMUNITY): Payer: Self-pay | Admitting: Pharmacist

## 2022-03-06 LAB — POCT INR: INR: 1.9 — AB (ref 2.0–3.0)

## 2022-03-07 ENCOUNTER — Encounter: Payer: BC Managed Care – PPO | Admitting: *Deleted

## 2022-03-07 DIAGNOSIS — Z95811 Presence of heart assist device: Secondary | ICD-10-CM

## 2022-03-07 DIAGNOSIS — I5042 Chronic combined systolic (congestive) and diastolic (congestive) heart failure: Secondary | ICD-10-CM | POA: Diagnosis not present

## 2022-03-07 DIAGNOSIS — Z48812 Encounter for surgical aftercare following surgery on the circulatory system: Secondary | ICD-10-CM | POA: Diagnosis not present

## 2022-03-07 NOTE — Progress Notes (Signed)
Daily Session Note  Patient Details  Name: [name] MRN: [id number] Date of Birth: 1970/07/19 Referring Provider:   Flowsheet Row Cardiac Rehab from 11/13/2021 in [facility] Cardiac and Pulmonary Rehab  Referring Provider [name] MD       Encounter Date: 03/07/2022  Check In:  Session Check In - 03/07/22 1039       Check-In   Supervising physician immediately available to respond to emergencies See telemetry face sheet for immediately available ER MD    Location [facility]-Cardiac & Pulmonary Rehab    Staff Present Heath Lark, RN, BSN, CCRP;Joseph Tessie Fass, RCP,RRT,BSRT   Kathee Delton BS,Exercise Physiologist   Virtual Visit No    Medication changes reported     No    Fall or balance concerns reported    No    Warm-up and Cool-down Performed on first and last piece of equipment    Resistance Training Performed Yes    VAD Patient? Yes    PAD/SET Patient? No      VAD patient   Has back up controller? Yes    Has spare charged batteries? Yes    Has battery cables? Yes    Has compatible battery clips? Yes      Pain Assessment   Currently in Pain? No/denies                Social History   Tobacco Use  Smoking Status Never  Smokeless Tobacco Never    Goals Met:  Independence with exercise equipment Exercise tolerated well No report of concerns or symptoms today  Goals Unmet:  Not Applicable  Comments: Pt able to follow exercise prescription today without complaint.  Will continue to monitor for progression.    Dr. Emily Filbert is Medical Director for [place].  Dr. Ottie Glazier is Medical Director for [facility] Pulmonary Rehabilitation.

## 2022-03-10 ENCOUNTER — Encounter: Payer: BC Managed Care – PPO | Admitting: *Deleted

## 2022-03-10 DIAGNOSIS — Z95811 Presence of heart assist device: Secondary | ICD-10-CM

## 2022-03-10 DIAGNOSIS — I5042 Chronic combined systolic (congestive) and diastolic (congestive) heart failure: Secondary | ICD-10-CM | POA: Diagnosis not present

## 2022-03-10 DIAGNOSIS — Z48812 Encounter for surgical aftercare following surgery on the circulatory system: Secondary | ICD-10-CM | POA: Diagnosis not present

## 2022-03-10 NOTE — Progress Notes (Signed)
Daily Session Note  Patient Details  Name: [name] MRN: [id number] Date of Birth: 12/22/1970 Referring Provider:   Flowsheet Row Cardiac Rehab from 11/13/2021 in [facility] Cardiac and Pulmonary Rehab  Referring Provider McLean, Dalton MD       Encounter Date: 03/10/2022  Check In:  Session Check In - 03/10/22 1023       Check-In   Supervising physician immediately available to respond to emergencies See telemetry face sheet for immediately available ER MD    Location [facility]-Cardiac & Pulmonary Rehab    Staff Present Meredith Craven, RN BSN;Kelly Hayes, BS, ACSM CEP, Exercise Physiologist;Noah Tickle, BS, Exercise Physiologist    Virtual Visit No    Medication changes reported     No    Fall or balance concerns reported    No    Warm-up and Cool-down Performed on first and last piece of equipment    Resistance Training Performed Yes    VAD Patient? Yes    PAD/SET Patient? No      VAD patient   Has back up controller? Yes    Has spare charged batteries? Yes    Has battery cables? Yes    Has compatible battery clips? Yes      Pain Assessment   Currently in Pain? No/denies                Social History   Tobacco Use  Smoking Status Never  Smokeless Tobacco Never    Goals Met:  Independence with exercise equipment Exercise tolerated well No report of concerns or symptoms today Strength training completed today  Goals Unmet:  Not Applicable  Comments:  [name] graduated today from  rehab with 36 sessions completed.  Details of the patient's exercise prescription and what She needs to do in order to continue the prescription and progress were discussed with patient.  Patient was given a copy of prescription and goals.  Patient verbalized understanding.  [name] plans to continue to exercise by walking and using staff videos.     Dr. Mark Miller is Medical Director for [facility].  Dr. Fuad Aleskerov is Medical Director for [facility]  Pulmonary Rehabilitation. 

## 2022-03-10 NOTE — Progress Notes (Signed)
Cardiac Individual Treatment Plan  Patient Details  Name: [name] MRN: [id number] Date of Birth: 29-Sep-1970 Referring Provider:   Flowsheet Row Cardiac Rehab from 11/13/2021 in [facility] Cardiac and Pulmonary Rehab  Referring Provider [name] MD       Initial Encounter Date:  Flowsheet Row Cardiac Rehab from 11/13/2021 in [facility] Cardiac and Pulmonary Rehab  Date 11/13/21       Visit Diagnosis: LVAD (left ventricular assist device) present [facility])  Patient's Home Medications on Admission:  Current Outpatient Medications:    aspirin EC 81 MG tablet, Take 1 tablet (81 mg total) by mouth daily. Swallow whole., Disp: 90 tablet, Rfl: 3   enoxaparin (LOVENOX) 40 MG/0.4ML injection, Inject 0.4 mLs (40 mg total) into the skin every 12 (twelve) hours. (Patient not taking: Reported on 02/19/2022), Disp: 0.4 mL, Rfl: 0   methimazole (TAPAZOLE) 10 MG tablet, Take 1 tablet (10 mg total) by mouth 2 (two) times daily., Disp: 180 tablet, Rfl: 1   potassium chloride SA (KLOR-CON M) 20 MEQ tablet, Take 3 tablets (60 mEq total) by mouth daily., Disp: 270 tablet, Rfl: 3   predniSONE (DELTASONE) 10 MG tablet, Take 1 tablet (10 mg total) by mouth daily with breakfast., Disp: 90 tablet, Rfl: 1   quiniDINE gluconate 324 MG CR tablet, Take 1 tablet (324 mg total) by mouth 2 (two) times daily., Disp: 60 tablet, Rfl: 0   rosuvastatin (CRESTOR) 40 MG tablet, Take 1 tablet (40 mg total) by mouth at bedtime., Disp: 30 tablet, Rfl: 0   senna-docusate (SENOKOT-S) 8.6-50 MG tablet, Take 2 tablets by mouth at bedtime. For AFTER surgery, do not take if having diarrhea (Patient not taking: Reported on 02/19/2022), Disp: 30 tablet, Rfl: 0   sildenafil (REVATIO) 20 MG tablet, Take 1 tablet (20 mg total) by mouth 3 (three) times daily., Disp: 90 tablet, Rfl: 6   spironolactone (ALDACTONE) 25 MG tablet, Take 1 tablet (25 mg total) by mouth daily. Restart on 02/10/22., Disp: 45 tablet, Rfl: 3   torsemide (DEMADEX) 20 MG  tablet, Take 2 tablets (40 mg total) by mouth daily., Disp: 180 tablet, Rfl: 3   traMADol (ULTRAM) 50 MG tablet, Take 1 tablet (50 mg total) by mouth every 6 (six) hours as needed for severe pain. For AFTER surgery only, do not take and drive (Patient not taking: Reported on 02/19/2022), Disp: 10 tablet, Rfl: 0   warfarin (COUMADIN) 2 MG tablet, Take 2 mg every Tuesday and 4 mg all other days or as directed by LVAD Clinic., Disp: 120 tablet, Rfl: 5   zinc gluconate 50 MG tablet, Take 1 tablet (50 mg total) by mouth daily., Disp: 30 tablet, Rfl: 6  Past Medical History: Past Medical History:  Diagnosis Date   Automatic implantable cardioverter-defibrillator in situ    Chronic CHF (congestive heart failure) (HCC)    a. EF 15-20% b. RHC (09/2013) RA 14, RV 57/22, PA 64/36 (48), PCWP 18, FIck CO/CI 3.7/1.6, PVR 8.1 WU, PA sat 47%    History of hypothyroidism    History of stomach ulcers    Hyperthyroidism    Hypotension    Morbid obesity ([place])    Myocardial infarction ([place]) 08/2013   Nocturnal dyspnea    Nonischemic cardiomyopathy (HCC)    Sinus tachycardia    Sleep apnea    Snoring-prob OSA 09/04/2011   [employer] ICD lead RECALL  6949    Stroke ([place]) 2021   some residual right-sided weakness   UARS (upper airway  resistance syndrome) 09/04/2011   HST 12/2013:  AHI 4/hr (numerous episodes of airflow reduction that did not have concomitant desaturation)     Tobacco Use: Social History   Tobacco Use  Smoking Status Never  Smokeless Tobacco Never    Labs: Review Flowsheet  More data exists      Latest Ref Rng & Units 09/18/2021 09/19/2021 09/20/2021 12/24/2021 02/05/2022  Labs for ITP Cardiac and Pulmonary Rehab  PH, Arterial 7.35 - 7.45 - - - - 7.325   PCO2 arterial 32 - 48 mmHg - - - - 49.0   Bicarbonate 20.0 - 28.0 mmol/L - - - - 25.5   TCO2 22 - 32 mmol/L - - - 21  27   Acid-base deficit 0.0 - 2.0 mmol/L - - - - 1.0   O2 Saturation % 63.7  67.5  66.6  - 100      Exercise  Target Goals: Exercise Program Goal: Individual exercise prescription set using results from initial 6 min walk test and THRR while considering  patient's activity barriers and safety.   Exercise Prescription Goal: Initial exercise prescription builds to 30-45 minutes a day of aerobic activity, 2-3 days per week.  Home exercise guidelines will be given to patient during program as part of exercise prescription that the participant will acknowledge.   Education: Aerobic Exercise: - Group verbal and visual presentation on the components of exercise prescription. Introduces F.I.T.T principle from ACSM for exercise prescriptions.  Reviews F.I.T.T. principles of aerobic exercise including progression. Written material given at graduation. Flowsheet Row Cardiac Rehab from 10/17/2020 in [facility] Cardiac and Pulmonary Rehab  Date 08/08/20  Educator [facility]  Instruction Review Code 1- Verbalizes Understanding       Education: Resistance Exercise: - Group verbal and visual presentation on the components of exercise prescription. Introduces F.I.T.T principle from ACSM for exercise prescriptions  Reviews F.I.T.T. principles of resistance exercise including progression. Written material given at graduation. Flowsheet Row Cardiac Rehab from 10/17/2020 in [facility] Cardiac and Pulmonary Rehab  Date 10/17/20  Educator [facility]  Instruction Review Code 1- Verbalizes Understanding        Education: Exercise & Equipment Safety: - Individual verbal instruction and demonstration of equipment use and safety with use of the equipment. Flowsheet Row Cardiac Rehab from 11/13/2021 in [facility] Cardiac [facility] Pulmonary Rehab  Education need identified 11/13/21  Date 11/13/21  Educator [place]  Instruction Review Code 1- Verbalizes Understanding       Education: Exercise Physiology & General Exercise Guidelines: - Group verbal and written instruction with models to review the exercise physiology of the cardiovascular system and associated  critical values. Provides general exercise guidelines with specific guidelines to those with heart or lung disease.  Flowsheet Row Cardiac Rehab from 10/17/2020 in [facility] Cardiac and Pulmonary Rehab  Date 10/03/20  Educator [facility]  Instruction Review Code 1- Verbalizes Understanding       Education: Flexibility, Balance, Mind/Body Relaxation: - Group verbal and visual presentation with interactive activity on the components of exercise prescription. Introduces F.I.T.T principle from ACSM for exercise prescriptions. Reviews F.I.T.T. principles of flexibility and balance exercise training including progression. Also discusses the mind body connection.  Reviews various relaxation techniques to help reduce and manage stress (i.e. Deep breathing, progressive muscle relaxation, and visualization). Balance handout provided to take home. Written material given at graduation. Flowsheet Row Cardiac Rehab from 10/17/2020 in [facility] Cardiac and Pulmonary Rehab  Date 08/22/20  Educator [facility]  Instruction Review Code 1- Verbalizes Understanding  Activity Barriers & Risk Stratification:  Activity Barriers & Cardiac Risk Stratification - 11/13/21 1234       Activity Barriers & Cardiac Risk Stratification   Activity Barriers Deconditioning;Muscular Weakness;Shortness of Breath;Decreased Ventricular Function;Other (comment)    Comments LVAD    Cardiac Risk Stratification High             6 Minute Walk:  6 Minute Walk     Row Name 11/13/21 1245 02/26/22 1205       6 Minute Walk   Phase Initial Discharge    Distance 985 feet 1306 feet    Distance % Change -- 32.6 %    Distance Feet Change -- 321 ft    Walk Time 6 minutes 6 minutes    # of Rest Breaks 0 0    MPH 1.86 2.47    METS 2.68 3.46    RPE 10 15    Perceived Dyspnea  1 --    VO2 Peak 9.41 12.11    Symptoms Yes (comment) Yes (comment)    Comments Slight SOB, unsteadiness with balance cramping in calves    Resting HR 81 bpm 78 bpm     Resting BP 103/64  automated BP cuff 82/43    Resting Oxygen Saturation  97 % 98 %    Exercise Oxygen Saturation  during 6 min walk 96 % 98 %    Max Ex. HR 105 bpm 135 bpm    Max Ex. BP 112/83  automated BP cuff 130/90    2 Minute Post BP 85/74  automated BP cuff; On entresto and denied dizziness/lightheadedness --             Oxygen Initial Assessment:   Oxygen Re-Evaluation:   Oxygen Discharge (Final Oxygen Re-Evaluation):   Initial Exercise Prescription:  Initial Exercise Prescription - 11/13/21 1200       Date of Initial Exercise RX and Referring Provider   Date 11/13/21    Referring Provider [name] MD      Oxygen   Maintain Oxygen Saturation 88% or higher      Recumbant Bike   Level 1    RPM 60    Watts 22    Minutes 15    METs 2.6      NuStep   Level 1    SPM 80    Minutes 15    METs 2.6      REL-XR   Level 1    Speed 50    Minutes 15    METs 2.6      Track   Laps 21    Minutes 15    METs 2.14      Prescription Details   Frequency (times per week) 3    Duration Progress to 30 minutes of continuous aerobic without signs/symptoms of physical distress      Intensity   THRR 40-80% of Max Heartrate 116- 152    Ratings of Perceived Exertion 11-13    Perceived Dyspnea 0-4      Progression   Progression Continue to progress workloads to maintain intensity without signs/symptoms of physical distress.      Resistance Training   Training Prescription Yes    Weight 3 lb    Reps 10-15             Perform Capillary Blood Glucose checks as needed.  Exercise Prescription Changes:   Exercise Prescription Changes     Row Name 11/13/21 1200 11/20/21 1400 12/02/21 1600 12/16/21  0800 12/30/21 0900     Response to Exercise   Blood Pressure (Admit) 103/64  automated BP cuff '93/81 91/56 93/80 ' 103/57   Blood Pressure (Exercise) 112/83  automated BP cuff --  76 Doppler 107/39 114/82 --   Blood Pressure (Exit) 85/74  automated BP cuff; On  entresto and denied dizziness/lightheadedness --  72 Doppler 116/83 114/82 78/65   Heart Rate (Admit) 71 bpm 91 bpm 103 bpm 92 bpm 100 bpm   Heart Rate (Exercise) 105 bpm 116 bpm 113 bpm 113 bpm 118 bpm   Heart Rate (Exit) 80 bpm 98 bpm 90 bpm 83 bpm 100 bpm   Oxygen Saturation (Admit) 97 % -- -- -- --   Oxygen Saturation (Exercise) 96 % -- -- -- --   Oxygen Saturation (Exit) 97 % -- -- -- --   Rating of Perceived Exertion (Exercise) '10 13 13 14 13   ' Perceived Dyspnea (Exercise) 1 -- -- -- --   Symptoms Slight SOB, unsteady gait -- SOB none none   Comments walk test results Today was her first day at Rehab -- -- --   Duration -- -- Continue with 30 min of aerobic exercise without signs/symptoms of physical distress. Continue with 30 min of aerobic exercise without signs/symptoms of physical distress. Continue with 30 min of aerobic exercise without signs/symptoms of physical distress.   Intensity -- -- THRR unchanged THRR unchanged THRR unchanged     Progression   Progression -- -- Continue to progress workloads to maintain intensity without signs/symptoms of physical distress. Continue to progress workloads to maintain intensity without signs/symptoms of physical distress. Continue to progress workloads to maintain intensity without signs/symptoms of physical distress.   Average METs -- -- 2.07 2.47 2.72     Resistance Training   Training Prescription -- -- Yes Yes Yes   Weight -- -- 3 lb 3 lb 3 lb   Reps -- -- 10-15 10-15 10-15     Interval Training   Interval Training -- -- No No No     Treadmill   MPH -- -- -- 1.5 1.7   Grade -- -- -- 0 2   Minutes -- -- -- 15 15   METs -- -- -- 2.15 2.77     Recumbant Bike   Level -- -- 1.'5 1 2   ' Watts -- -- '16 20 19   ' Minutes -- -- '15 15 15   ' METs -- -- 2.45 2.59 2.6     NuStep   Level -- -- 1 5 --   Minutes -- -- 15 15 --   METs -- -- 2.5 2.8 --     REL-XR   Level -- -- '1 2 2   ' Minutes -- -- '15 15 15   ' METs -- -- 1.4 3.1 3.1      T5 Nustep   Level -- -- 3 -- --   Minutes -- -- 15 -- --   METs -- -- 1.9 -- --     Track   Laps -- -- 20 -- --   Minutes -- -- 15 -- --   METs -- -- 2.09 -- --     Home Exercise Plan   Plans to continue exercise at -- -- -- -- Home (comment)  walking, treadmill, staff videos   Frequency -- -- -- -- Add 2 additional days to program exercise sessions.   Initial Home Exercises Provided -- -- -- -- 12/18/21     Oxygen   Maintain Oxygen  Saturation -- -- 88% or higher 88% or higher 88% or higher    Row Name 01/13/22 1500 01/27/22 0900 02/10/22 1000 02/26/22 1500       Response to Exercise   Blood Pressure (Admit) 98/70 107/89 110/79 114/95    Blood Pressure (Exit) 106/84 109/73 108/87 92/61    Heart Rate (Admit) 94 bpm 59 bpm 86 bpm 75 bpm    Heart Rate (Exercise) 133 bpm 118 bpm 126 bpm 108 bpm    Heart Rate (Exit) 84 bpm 70 bpm 71 bpm 62 bpm    Rating of Perceived Exertion (Exercise) '13 13 15 13    ' Symptoms none none none none    Duration Continue with 30 min of aerobic exercise without signs/symptoms of physical distress. Continue with 30 min of aerobic exercise without signs/symptoms of physical distress. Continue with 30 min of aerobic exercise without signs/symptoms of physical distress. Continue with 30 min of aerobic exercise without signs/symptoms of physical distress.    Intensity THRR unchanged THRR unchanged THRR unchanged THRR unchanged      Progression   Progression Continue to progress workloads to maintain intensity without signs/symptoms of physical distress. Continue to progress workloads to maintain intensity without signs/symptoms of physical distress. Continue to progress workloads to maintain intensity without signs/symptoms of physical distress. Continue to progress workloads to maintain intensity without signs/symptoms of physical distress.    Average METs 2.82 2.96 2.59 2.9      Resistance Training   Training Prescription Yes Yes Yes Yes    Weight 3 lb 3 lb 3  lb 3 lb    Reps 10-15 10-15 10-15 10-15      Interval Training   Interval Training No No Yes No    Equipment -- -- Treadmill --    Comments -- -- intervals of speed from 2-2.5 mph --      Treadmill   MPH 2 2 2.5 2.5    Grade '1 1 1 ' 0    Minutes '15 15 15 15    ' METs 2.81 2.81 3.26 2.91      Recumbant Bike   Level '2 2 2 3    ' Watts 23 23 -- 22    Minutes '15 15 15 15    ' METs 2.67 2.66 2.9 2.63      NuStep   Level '6 4 6 ' --    Minutes '15 15 15 ' --    METs 3.3 3.1 3.1 --      REL-XR   Level '3 6 4 ' --    Minutes '15 15 15 ' --    METs 3 4.2 1.9 --      Track   Laps -- 27 -- 22    Minutes -- 15 -- 15    METs -- 2.47 -- 2.2      Home Exercise Plan   Plans to continue exercise at Home (comment)  walking, treadmill, staff videos Home (comment)  walking, treadmill, staff videos Home (comment)  walking, treadmill, staff videos Home (comment)  walking, treadmill, staff videos    Frequency Add 2 additional days to program exercise sessions. Add 2 additional days to program exercise sessions. Add 2 additional days to program exercise sessions. Add 2 additional days to program exercise sessions.    Initial Home Exercises Provided 12/18/21 12/18/21 12/18/21 12/18/21      Oxygen   Maintain Oxygen Saturation 88% or higher 88% or higher 88% or higher 88% or higher  Exercise Comments:   Exercise Comments     Row Name 11/20/21 1002           Exercise Comments First full day of exercise!  Patient was oriented to gym and equipment including functions, settings, policies, and procedures.  Patient's individual exercise prescription and treatment plan were reviewed.  All starting workloads were established based on the results of the 6 minute walk test done at initial orientation visit.  The plan for exercise progression was also introduced and progression will be customized based on patient's performance and goals.                Exercise Goals and Review:   Exercise Goals      Row Name 11/13/21 1258             Exercise Goals   Increase Physical Activity Yes       Intervention Provide advice, education, support and counseling about physical activity/exercise needs.;Develop an individualized exercise prescription for aerobic and resistive training based on initial evaluation findings, risk stratification, comorbidities and participant's personal goals.       Expected Outcomes Short Term: Attend rehab on a regular basis to increase amount of physical activity.;Long Term: Add in home exercise to make exercise part of routine and to increase amount of physical activity.;Long Term: Exercising regularly at least 3-5 days a week.       Increase Strength and Stamina Yes       Intervention Provide advice, education, support and counseling about physical activity/exercise needs.;Develop an individualized exercise prescription for aerobic and resistive training based on initial evaluation findings, risk stratification, comorbidities and participant's personal goals.       Expected Outcomes Short Term: Increase workloads from initial exercise prescription for resistance, speed, and METs.;Short Term: Perform resistance training exercises routinely during rehab and add in resistance training at home;Long Term: Improve cardiorespiratory fitness, muscular endurance and strength as measured by increased METs and functional capacity (6MWT)       Able to understand and use rate of perceived exertion (RPE) scale Yes       Intervention Provide education and explanation on how to use RPE scale       Expected Outcomes Short Term: Able to use RPE daily in rehab to express subjective intensity level;Long Term:  Able to use RPE to guide intensity level when exercising independently       Able to understand and use Dyspnea scale Yes       Intervention Provide education and explanation on how to use Dyspnea scale       Expected Outcomes Short Term: Able to use Dyspnea scale daily in rehab to  express subjective sense of shortness of breath during exertion;Long Term: Able to use Dyspnea scale to guide intensity level when exercising independently       Knowledge and understanding of Target Heart Rate Range (THRR) Yes       Intervention Provide education and explanation of THRR including how the numbers were predicted and where they are located for reference       Expected Outcomes Short Term: Able to state/look up THRR;Short Term: Able to use daily as guideline for intensity in rehab;Long Term: Able to use THRR to govern intensity when exercising independently       Able to check pulse independently Yes       Intervention Provide education and demonstration on how to check pulse in carotid and radial arteries.;Review the importance of being able to check your  own pulse for safety during independent exercise       Expected Outcomes Short Term: Able to explain why pulse checking is important during independent exercise;Long Term: Able to check pulse independently and accurately       Understanding of Exercise Prescription Yes       Intervention Provide education, explanation, and written materials on patient's individual exercise prescription       Expected Outcomes Short Term: Able to explain program exercise prescription;Long Term: Able to explain home exercise prescription to exercise independently                Exercise Goals Re-Evaluation :  Exercise Goals Re-Evaluation     Row Name 11/20/21 1002 11/20/21 1409 12/02/21 1600 12/16/21 0830 12/18/21 1007     Exercise Goal Re-Evaluation   Exercise Goals Review Increase Physical Activity;Able to understand and use rate of perceived exertion (RPE) scale;Knowledge and understanding of Target Heart Rate Range (THRR);Understanding of Exercise Prescription;Increase Strength and Stamina;Able to understand and use Dyspnea scale;Able to check pulse independently Increase Physical Activity;Understanding of Exercise Prescription;Increase  Strength and Stamina Increase Physical Activity;Understanding of Exercise Prescription;Increase Strength and Stamina Increase Physical Activity;Understanding of Exercise Prescription;Increase Strength and Stamina Increase Physical Activity;Understanding of Exercise Prescription;Increase Strength and Stamina;Able to understand and use rate of perceived exertion (RPE) scale;Able to understand and use Dyspnea scale;Knowledge and understanding of Target Heart Rate Range (THRR);Able to check pulse independently   Comments Reviewed RPE and dyspnea scales, THR and program prescription with pt today.  Pt voiced understanding and was given a copy of goals to take home. [name] did well on her first day in the program. Pt will benefit from continuing to attend rehab. [name] is off to a great start in rehab.  Some days we have been able to use automatic cuff and some days need to use dopplar for blood pressures.  She has only attended three full sessions as she had to miss a few appointments due to an alarm on her VAD but was found to be to dry and meds were adjusted.  She is up to level 1.5 on the bike and level 3 on T5 NuStep. We will conitnue to monitor her progress. [name] is doing well in rehab. She has done well on the treadmill at a speed of 1.5 mph. She also increased to level 5 on the T4 machine. [name] has tolerated using 3 lb hand weights for resistance training as well. We will continue to monitor her progress in the program. Updated home exercise with pt today.  Pt plans to walk and use treadmill and bike at home for exercise.  Reviewed THR, pulse, RPE, sign and symptoms, pulse oximetery and when to call 911 or MD.  Also review LVAD parameters and reminded of precautions for it with exercise at home.  Also discussed weather considerations and indoor options.  Pt voiced understanding.   Expected Outcomes Short: Use RPE daily to regulate intensity. Long: Follow program prescription in THR. Short: Use RPE daily to  regulate intensity. Long: Follow program prescription in THR. Short: Continue ot attend rehab regularly Long: Continue to follow program prescription Short: Continue to increase laps on the track Long: Continue to improve strength and stamina. Short: Start to add exercise back in at home Long: Continue to rebuild stamina.    [place] Name 12/30/21 [id number] 01/13/22 1519 01/15/22 1012 01/27/22 0946 02/10/22 1019     Exercise Goal Re-Evaluation   Exercise Goals Review Increase Physical Activity;Increase Strength and Stamina;Understanding of  Exercise Prescription Increase Physical Activity;Increase Strength and Stamina;Understanding of Exercise Prescription Increase Physical Activity;Increase Strength and Stamina;Understanding of Exercise Prescription Increase Physical Activity;Increase Strength and Stamina;Understanding of Exercise Prescription Increase Physical Activity;Increase Strength and Stamina;Understanding of Exercise Prescription   Comments [name] has been doing well in rehab.  She missed part of last week by needing iron infusions and med adjustments.  She has worked her way up to 2% grade on the treadmill.  We will encouarge her to try 4 lb weights and continue to montior her progress. [name] continues to do well in rehab. She has increased her levels on both the recumbent bike and T4 Nustep. RPEs are in appropriate range. She would benefit from using 4 lbs for handweights. Will continue to monitor. [name] is doing well in rehab.  She is walking on the treadmill at home for 20-30 min each day or go to the stores to walk.  She feels likes she is recovering her strength and stamina.  She has noticed a big change since she started rehab.  When she started, she was not able to walk up her stairs and now she is able to go up them without a problem. [name] continues to do well in rehab. She recently improved her overall average MET level to 2.96 METs. She increased her laps on the track to 27 laps as well. She also  improved from level 3 to level 6 on the XR. We will continue to monitor Kolbee's progress in the program. Talynn continues to do well in rehab. She has consistently kept her average MET level above 2.5 METs. She also improved to level 6 on the T4 machine. She also has been doing intervals of speed on the treadmill from 2-2.5 mph with an incline of 1%. We will continue to monitor her progress in the program.   Expected Outcomes Short: Try 4 lb hand weights once back to exercise again Long; Continue to build stamina Short: Increase to 4 lbs for handweights Long: Continue to increase overall MET level Short: Continue to use treadmill at home Long; Continue to improve stamina Short: Continue to increase workload on the treadmill. Long: Continue to improve strength and stamina. Short: Continue to increase workload on the treadmill. Long: Continue to increase overall MET levels.    [name] Name 02/26/22 1526             Exercise Goal Re-Evaluation   Exercise Goals Review Increase Physical Activity;Increase Strength and Stamina;Understanding of Exercise Prescription       Comments [name] is doing well in rehab and is close to graduating. She recently increased her overall average MET level to 2.9 METs. She also improved on her post 6MWT by 32.6%! We will continue to monitor her progress in the program until she graduates.       Expected Outcomes Short: Graduate. Long: Continue to exercise independently.                Discharge Exercise Prescription (Final Exercise Prescription Changes):  Exercise Prescription Changes - 02/26/22 1500       Response to Exercise   Blood Pressure (Admit) 114/95    Blood Pressure (Exit) 92/61    Heart Rate (Admit) 75 bpm    Heart Rate (Exercise) 108 bpm    Heart Rate (Exit) 62 bpm    Rating of Perceived Exertion (Exercise) 13    Symptoms none    Duration Continue with 30 min of aerobic exercise without signs/symptoms of physical distress.    Intensity  THRR unchanged       Progression   Progression Continue to progress workloads to maintain intensity without signs/symptoms of physical distress.    Average METs 2.9      Resistance Training   Training Prescription Yes    Weight 3 lb    Reps 10-15      Interval Training   Interval Training No      Treadmill   MPH 2.5    Grade 0    Minutes 15    METs 2.91      Recumbant Bike   Level 3    Watts 22    Minutes 15    METs 2.63      Track   Laps 22    Minutes 15    METs 2.2      Home Exercise Plan   Plans to continue exercise at Home (comment)   walking, treadmill, staff videos   Frequency Add 2 additional days to program exercise sessions.    Initial Home Exercises Provided 12/18/21      Oxygen   Maintain Oxygen Saturation 88% or higher             Nutrition:  Target Goals: Understanding of nutrition guidelines, daily intake of sodium <1565m, cholesterol <2033m calories 30% from fat and 7% or less from saturated fats, daily to have 5 or more servings of fruits and vegetables.  Education: All About Nutrition: -Group instruction provided by verbal, written material, interactive activities, discussions, models, and posters to present general guidelines for heart healthy nutrition including fat, fiber, MyPlate, the role of sodium in heart healthy nutrition, utilization of the nutrition label, and utilization of this knowledge for meal planning. Follow up email sent as well. Written material given at graduation. [facility]  Education need identified 11/13/21       Biometrics:  Pre Biometrics - 11/13/21 1234       Pre Biometrics   Height '5\' 4"'  (1.626 m)    Weight 230 lb 12.8 oz (104.7 kg)   Weighed with LVAD   BMI (Calculated) 39.6    Single Leg Stand 30 seconds             Post Biometrics - 02/26/22 1206        Post  Biometrics   Height '5\' 4"'  (1.626 m)    Weight 243 lb 11.2 oz (110.5 kg)    BMI  (Calculated) 41.81    Single Leg Stand 30 seconds             Nutrition Therapy Plan and Nutrition Goals:  Nutrition Therapy & Goals - 12/09/21 0912       Nutrition Therapy   Diet Heart healthy, low Na    Drug/Food Interactions Statins/Certain Fruits;Coumadin/Vit K    Protein (specify units) 100g    Fiber 25 grams    Whole Grain Foods 3 servings    Saturated Fats 12 max. grams    Fruits and Vegetables 8 servings/day    Sodium 1.5 grams      Personal Nutrition Goals   Nutrition Goal ST: limit sugar-sweetened beverages, try citris or vinegar with meals to help with flavor, include vitamin K rich food consistently - work with health care team LT: continue with mechanical eating to meet nutritional needs, follow MyPlate guidelines for meals, limit added sugar <24g/day    Comments [age].o. F admitted to cardiac rehab  for chronic combined systolic and  diastolic HF. PMHx includes hypotension, CVA (2021), NICM EF 20-25%, a.fib, UARS, hyperthyroidism, LVAD (08/2021), moderate malnutrition (08/2021), MI (2015). Relevant medications includes methimazole, crestor, prednisone, warfarin, zinc, torsemide PRN. PYP Score: 70. Vegetables & Fruits 7/12. Breads, Grains & Cereals 8/12. Red & Processed Meat 9/12. Poultry 2/2. Fish & Shellfish 2/4. Beans, Nuts & Seeds 3/4. Milk & Dairy Foods 6/6. Toppings, Oils, Seasonings & Salt 13/20. Sweets, Snacks & Restaurant Food 12/14. Beverages 8/10.  Breakfast: 1 egg with 1.5 pieces of bacon or [place] bacon Snacks: almonds, skinny pop, watermelon Lunch: same as dinner Snacks: almonds, skinny pop, watermelon Dinner: pork tenderloin, chicken breast, occasional steak - cook to have for the week. They have a garden so she will eat vegetables from her garden - she is limiting her vitamin K rich food. She will cook with some butter but mostly she will use pam spray. She rarely has fried foods, but will sometimes have fried chicken. She does not salt her food. Drinks: lemonade,  water, OJ, sometimes cranberry-watermelon juice. She reports that she is practicing mechanical eating as she is not feeling her hunger cues. She is also struggling with a metallic taste in her mouth which she reports is getting better. Encouraged to have protein rich foods at each meal and spoke to her higher protein needs. Encouraged her to limit her sugar sweetened beverages and include more nutrient dense foods such as peanut butter as this is something she enjoys. She would like to lose weight - discussed how it is important she meet her nutriitonal needs to ensure proper nutrition and limit muscle loss; also discussed the body's compensatory mechanisms. She reorts limiting vitamin K rich food, discussed updated recommendations to include these foods consistently and in similar amounts and to work with health care team. Reviewed heart healthy eating.      Intervention Plan   Intervention Prescribe, educate and counsel regarding individualized specific dietary modifications aiming towards targeted core components such as weight, hypertension, lipid management, diabetes, heart failure and other comorbidities.    Expected Outcomes Short Term Goal: Understand basic principles of dietary content, such as calories, fat, sodium, cholesterol and nutrients.;Short Term Goal: A plan has been developed with personal nutrition goals set during dietitian appointment.;Long Term Goal: Adherence to prescribed nutrition plan.             Nutrition Assessments:  MEDIFICTS Score Key: ?70 Need to make dietary changes  40-70 Heart Healthy Diet ? 40 Therapeutic Level Cholesterol Diet  Flowsheet Row Documentation from 02/28/2022 in [facility] Cardiac [facility] Pulmonary Rehab  Picture Your Plate Total Score on Admission 70  Picture Your Plate Total Score on Discharge 76      Picture Your Plate Scores: <85 Unhealthy dietary pattern with much room for improvement. 41-50 Dietary pattern unlikely to meet recommendations for  good health and room for improvement. 51-60 More healthful dietary pattern, with some room for improvement.  >60 Healthy dietary pattern, although there may be some specific behaviors that could be improved.    Nutrition Goals Re-Evaluation:  Nutrition Goals Re-Evaluation     [name] Name 01/15/22 1022             Goals   Nutrition Goal ST: limit sugar-sweetened beverages, try citris or vinegar with meals to help with flavor, include vitamin K rich food consistently - work with health care team LT: continue with mechanical eating to meet nutritional needs, follow MyPlate guidelines for meals, limit added sugar <24g/day       Comment  [name] is doing well with her diet.  Her weight went up some but she is hoping it will start to come down again.  She has cut back on sweeted drinks.  She is using diet cranberry juice and we talked about diluting it more too.  She also bought a water diffuser and using lemon and mint to flavor.  She is working on adding in more vitamin K foods daily to try to help maintain her INR levels.  She is still eating becuase she has too as she doesn't have an appetite.  She still gets the metallic taste from some food and will use plastic utensils to help as well.  She eats when its time to eat just because she knkows she is supposed to eat.       Expected Outcome Short: Continue to work on weight loss and vitamin K food Long: Continue to eat to eat                Nutrition Goals Discharge (Final Nutrition Goals Re-Evaluation):  Nutrition Goals Re-Evaluation - 01/15/22 1022       Goals   Nutrition Goal ST: limit sugar-sweetened beverages, try citris or vinegar with meals to help with flavor, include vitamin K rich food consistently - work with health care team LT: continue with mechanical eating to meet nutritional needs, follow MyPlate guidelines for meals, limit added sugar <24g/day    Comment [name] is doing well with her diet.  Her weight went up some but she is  hoping it will start to come down again.  She has cut back on sweeted drinks.  She is using diet cranberry juice and we talked about diluting it more too.  She also bought a water diffuser and using lemon and mint to flavor.  She is working on adding in more vitamin K foods daily to try to help maintain her INR levels.  She is still eating becuase she has too as she doesn't have an appetite.  She still gets the metallic taste from some food and will use plastic utensils to help as well.  She eats when its time to eat just because she knkows she is supposed to eat.    Expected Outcome Short: Continue to work on weight loss and vitamin K food Long: Continue to eat to eat             Psychosocial: Target Goals: Acknowledge presence or absence of significant depression and/or stress, maximize coping skills, provide positive support system. Participant is able to verbalize types and ability to use techniques and skills needed for reducing stress and depression.   Education: Stress, Anxiety, and Depression - Group verbal and visual presentation to define topics covered.  Reviews how body is impacted by stress, anxiety, and depression.  Also discusses healthy ways to reduce stress and to treat/manage anxiety and depression.  Written material given at graduation. Flowsheet Row Cardiac Rehab from 10/17/2020 in [facility] Cardiac and Pulmonary Rehab  Education need identified 08/06/20  Date 09/26/20  Educator [facility]  Instruction Review Code 1- [employer] Understanding       Education: Sleep Hygiene -Provides group verbal and written instruction about how sleep can affect your health.  Define sleep hygiene, discuss sleep cycles and impact of sleep habits. Review good sleep hygiene tips.    Initial Review & Psychosocial Screening:  Initial Psych Review & Screening - 11/01/21 1410       Initial Review   Current issues with None Identified  Family Dynamics   Good Support System? Yes   husband and 40  and 23 years children     Barriers   Psychosocial barriers to participate in program There are no identifiable barriers or psychosocial needs.      Screening Interventions   Interventions Encouraged to exercise    Expected Outcomes Short Term goal: Utilizing psychosocial counselor, staff and physician to assist with identification of specific Stressors or current issues interfering with healing process. Setting desired goal for each stressor or current issue identified.;Long Term Goal: Stressors or current issues are controlled or eliminated.;Short Term goal: Identification and review with participant of any Quality of Life or Depression concerns found by scoring the questionnaire.;Long Term goal: The participant improves quality of Life and PHQ9 Scores as seen by post scores and/or verbalization of changes             Quality of Life Scores:   Quality of Life - 02/28/22 0941       Quality of Life Scores   Health/Function Pre 27.57 %    Health/Function Post 30 %    Health/Function % Change 8.81 %    Socioeconomic Pre 29.64 %    Socioeconomic Post 30 %    Socioeconomic % Change  1.21 %    Psych/Spiritual Pre 30 %    Psych/Spiritual Post 30 %    Psych/Spiritual % Change 0 %    Family Pre 30 %    Family Post 30 %    Family % Change 0 %    GLOBAL Pre 28.85 %    GLOBAL Post 30 %    GLOBAL % Change 3.99 %            Scores of 19 and below usually indicate a poorer quality of life in these areas.  A difference of  2-3 points is a clinically meaningful difference.  A difference of 2-3 points in the total score of the Quality of Life Index has been associated with significant improvement in overall quality of life, self-image, physical symptoms, and general health in studies assessing change in quality of life.  PHQ-9: Review Flowsheet  More data exists      02/28/2022 11/13/2021 09/03/2020 08/06/2020 10/05/2017  Depression screen PHQ 2/9  Decreased Interest 0 0 0 0 1  Down,  Depressed, Hopeless 0 0 0 0 0  PHQ - 2 Score 0 0 0 0 1  Altered sleeping 0 0 '1 1 1  ' Tired, decreased energy '1 1 1 2 2  ' Change in appetite 1 1 0 1 -  Feeling bad or failure about yourself  0 0 0 0 1  Trouble concentrating 0 0 0 0 0  Moving slowly or fidgety/restless 0 0 0 1 0  Suicidal thoughts 0 0 0 0 0  PHQ-9 Score '2 2 2 5 5  ' Difficult doing work/chores Not difficult at all Not difficult at all Not difficult at all Not difficult at all Somewhat difficult   Interpretation of Total Score  Total Score Depression Severity:  1-4 = Minimal depression, 5-9 = Mild depression, 10-14 = Moderate depression, 15-19 = Moderately severe depression, 20-27 = Severe depression   Psychosocial Evaluation and Intervention:  Psychosocial Evaluation - 11/01/21 1419       Psychosocial Evaluation & Interventions   Interventions Encouraged to exercise with the program and follow exercise prescription    Comments [name] is returning to the program after LVAD insertion. She has no barriers to attending. She has become deconditioned  after a long period in the hospital and recovering. She has lost weight and wants to lose more weight. She is trying to increase her protein intake and wants to talk with Melissa,our RD.  [name] is ready to get started.    Expected Outcomes STG Sallyann ia able to attend all sechedules sessions. She is able to work with our RD and find ways to increase her protein intake without gaining weight if possible. LTG Laporshia is able to continue to progress with er exercise and weight loss goals    Continue Psychosocial Services  Follow up required by staff             Psychosocial Re-Evaluation:  Psychosocial Re-Evaluation     Friend Name 12/18/21 1008 01/15/22 1019           Psychosocial Re-Evaluation   Current issues with Current Stress Concerns;Current Sleep Concerns Current Stress Concerns;Current Sleep Concerns      Comments [name] is doing well in rehab.  She has not been using her  CPAP as she does not feel like she stops breathing anymore. She has a sleep study scheduled for 7/10 with Dr. [name].  She feels that before it was from fluid build, but now that she is dryer she feels better.  Her biggest stressor is dealing with LVAD.  She is just happy to be alive and stay out of hospital.  She does have several appointments to keep up with, but just focuses on the positives. [name] is doing well in rehab.  She continues to feel happy to alive each day and chooses to focus on the positive.  She had her sleep study done and they did not tell her to use CPAP but no one has called her about the results.  They want her to redo the test because of her pulse oximeter readings and restless leg syndrome causing her to jump.  She did not have apenic events, so shes happy about not having to use her CPAP.  She is doing well with her VAD and keeping close eye on wound to heal.      Expected Outcomes Short: Get sleep study Long: Continue to focus on the positive Short: Continue to follow up on sleep study Long: Continue to focus on positive      Interventions Encouraged to attend Cardiac Rehabilitation for the exercise;Stress management education Encouraged to attend Cardiac Rehabilitation for the exercise;Stress management education      Continue Psychosocial Services  -- Follow up required by staff               Psychosocial Discharge (Final Psychosocial Re-Evaluation):  Psychosocial Re-Evaluation - 01/15/22 1019       Psychosocial Re-Evaluation   Current issues with Current Stress Concerns;Current Sleep Concerns    Comments [name] is doing well in rehab.  She continues to feel happy to alive each day and chooses to focus on the positive.  She had her sleep study done and they did not tell her to use CPAP but no one has called her about the results.  They want her to redo the test because of her pulse oximeter readings and restless leg syndrome causing her to jump.  She did not have apenic  events, so shes happy about not having to use her CPAP.  She is doing well with her VAD and keeping close eye on wound to heal.    Expected Outcomes Short: Continue to follow up on sleep study Long: Continue to focus on  positive    Interventions Encouraged to attend Cardiac Rehabilitation for the exercise;Stress management education    Continue Psychosocial Services  Follow up required by staff             Vocational Rehabilitation: Provide vocational rehab assistance to qualifying candidates.   Vocational Rehab Evaluation & Intervention:  Vocational Rehab - 02/28/22 0941       Discharge Vocational Rehab   Discharge Vocational Rehabilitation no need for VR             Education: Education Goals: Education classes will be provided on a variety of topics geared toward better understanding of heart health and risk factor modification. Participant will state understanding/return demonstration of topics presented as noted by education test scores.  Learning Barriers/Preferences:   General Cardiac Education Topics:  AED/CPR: - Group verbal and written instruction with the use of models to demonstrate the basic use of the AED with the basic ABC's of resuscitation.   Anatomy and Cardiac Procedures: - Group verbal and visual presentation and models provide information about basic cardiac anatomy and function. Reviews the testing methods done to diagnose heart disease and the outcomes of the test results. Describes the treatment choices: Medical Management, Angioplasty, or Coronary Bypass Surgery for treating various heart conditions including Myocardial Infarction, Angina, Valve Disease, and Cardiac Arrhythmias.  Written material given at graduation. Flowsheet Row Cardiac Rehab from 11/13/2021 in [facility] Cardiac and Pulmonary Rehab  Education need identified 11/13/21       Medication Safety: - Group verbal and visual instruction to review commonly prescribed medications for heart  and lung disease. Reviews the medication, class of the drug, and side effects. Includes the steps to properly store meds and maintain the prescription regimen.  Written material given at graduation. Flowsheet Row Cardiac Rehab from 10/17/2020 in [facility] Cardiac and Pulmonary Rehab  Date 09/05/20  Educator SB  Instruction Review Code 1- Verbalizes Understanding       Intimacy: - Group verbal instruction through game format to discuss how heart and lung disease can affect sexual intimacy. Written material given at graduation.. Flowsheet Row [facility]  Date 08/08/20  Educator [facility]  Instruction Review Code 1- Verbalizes Understanding       Know Your Numbers and Heart Failure: - Group verbal and visual instruction to discuss disease risk factors for cardiac and pulmonary disease and treatment options.  Reviews associated critical values for Overweight/Obesity, Hypertension, Cholesterol, and Diabetes.  Discusses basics of heart failure: signs/symptoms and treatments.  Introduces Heart Failure Zone chart for action plan for heart failure.  Written material given at graduation. Flowsheet Row Cardiac Rehab from 11/13/2021 in [facility] Cardiac and Pulmonary Rehab  Education need identified 11/13/21       Infection Prevention: - Provides verbal and written material to individual with discussion of infection control including proper hand washing and proper equipment cleaning during exercise session. Flowsheet Row Cardiac Rehab from 11/13/2021 in [facility] Cardiac and Pulmonary Rehab  Education need identified 11/13/21  Date 11/13/21  Educator [place]  Instruction Review Code 1- Verbalizes Understanding       Falls Prevention: - Provides verbal and written material to individual with discussion of falls prevention and safety. Flowsheet Row Cardiac Rehab from 11/13/2021 in [facility] Cardiac and Pulmonary Rehab  Education need identified 11/13/21  Date 11/13/21   Educator [place]  Instruction Review Code 1- Verbalizes Understanding       Other: -Provides group and verbal instruction on various  topics (see comments)   Knowledge Questionnaire Score:  Knowledge Questionnaire Score - 02/28/22 0940       Knowledge Questionnaire Score   Pre Score 23/26    Post Score 26/26             Core Components/Risk Factors/Patient Goals at Admission:  Personal Goals and Risk Factors at Admission - 11/13/21 1258       Core Components/Risk Factors/Patient Goals on Admission    Weight Management Yes;Weight Loss;Obesity    Intervention Weight Management: Develop a combined nutrition and exercise program designed to reach desired caloric intake, while maintaining appropriate intake of nutrient and fiber, sodium and fats, and appropriate energy expenditure required for the weight goal.;Weight Management/Obesity: Establish reasonable short term and long term weight goals.    Admit Weight 230 lb 12.8 oz (104.7 kg)   weighed with LVAD & bag   Goal Weight: Short Term 225 lb (102.1 kg)    Goal Weight: Long Term 200 lb (90.7 kg)    Expected Outcomes Short Term: Continue to assess and modify interventions until short term weight is achieved;Long Term: Adherence to nutrition and physical activity/exercise program aimed toward attainment of established weight goal;Weight Loss: Understanding of general recommendations for a balanced deficit meal plan, which promotes 1-2 lb weight loss per week and includes a negative energy balance of [phone] kcal/d;Understanding recommendations for meals to include 15-35% energy as protein, 25-35% energy from fat, 35-60% energy from carbohydrates, less than 271m of dietary cholesterol, 20-35 gm of total fiber daily;Understanding of distribution of calorie intake throughout the day with the consumption of 4-5 meals/snacks    Heart Failure Yes    Intervention Provide a combined exercise and nutrition program that is supplemented with  education, support and counseling about heart failure. Directed toward relieving symptoms such as shortness of breath, decreased exercise tolerance, and extremity edema.    Expected Outcomes Improve functional capacity of life;Short term: Attendance in program 2-3 days a week with increased exercise capacity. Reported lower sodium intake. Reported increased fruit and vegetable intake. Reports medication compliance.;Short term: Daily weights obtained and reported for increase. Utilizing diuretic protocols set by physician.;Long term: Adoption of self-care skills and reduction of barriers for early signs and symptoms recognition and intervention leading to self-care maintenance.    Hypertension Yes    Intervention Provide education on lifestyle modifcations including regular physical activity/exercise, weight management, moderate sodium restriction and increased consumption of fresh fruit, vegetables, and low fat dairy, alcohol moderation, and smoking cessation.;Monitor prescription use compliance.    Expected Outcomes Short Term: Continued assessment and intervention until BP is < 140/952mHG in hypertensive participants. < 130/8075mG in hypertensive participants with diabetes, heart failure or chronic kidney disease.;Long Term: Maintenance of blood pressure at goal levels.    Lipids Yes    Intervention Provide education and support for participant on nutrition & aerobic/resistive exercise along with prescribed medications to achieve LDL <74m52mDL >40mg41m Expected Outcomes Short Term: Participant states understanding of desired cholesterol values and is compliant with medications prescribed. Participant is following exercise prescription and nutrition guidelines.;Long Term: Cholesterol controlled with medications as prescribed, with individualized exercise RX and with personalized nutrition plan. Value goals: LDL < 74mg,49m > 40 mg.             Education:Diabetes - Individual verbal and written  instruction to review signs/symptoms of diabetes, desired ranges of glucose level fasting, after meals and with exercise. Acknowledge that pre and post exercise glucose  checks will be done for 3 sessions at entry of program.   Core Components/Risk Factors/Patient Goals Review:   Goals and Risk Factor Review     Row Name 12/18/21 1012 01/15/22 1027           Core Components/Risk Factors/Patient Goals Review   Personal Goals Review Heart Failure;Lipids;Weight Management/Obesity;Hypertension Heart Failure;Lipids;Weight Management/Obesity;Hypertension      Review Wayne is doing well in rehab.  Her weight is starting to go up some now that she is eating again, but she is working closely with dietitan to keep close eye on it.  Now that she can eat, she will start to focus on heart healthy eating again. She is good about checking her LVAD and staying on top of her fluid levels.  She has not had any heart failure symptoms.  Her pressures are staying steady both in class and at home. Kaitland is doing well in rehab.  Her weight is starting to come down some.  Her only heart failure symptom is getting SOB when it is hot outside, so she tries to stay inside on those days.  Her pressures are staying steady and still detectable with automatic cuff.  She is doing well on her medications.      Expected Outcomes -- short: Continue to work on weight loss Long: Continue to manage HF/VAD               Core Components/Risk Factors/Patient Goals at Discharge (Final Review):   Goals and Risk Factor Review - 01/15/22 1027       Core Components/Risk Factors/Patient Goals Review   Personal Goals Review Heart Failure;Lipids;Weight Management/Obesity;Hypertension    Review Okie is doing well in rehab.  Her weight is starting to come down some.  Her only heart failure symptom is getting SOB when it is hot outside, so she tries to stay inside on those days.  Her pressures are staying steady and still detectable with  automatic cuff.  She is doing well on her medications.    Expected Outcomes short: Continue to work on weight loss Long: Continue to manage HF/VAD             ITP Comments:  ITP Comments     Row Name 11/01/21 1424 11/13/21 1223 11/20/21 1002 12/04/21 0731 12/09/21 1015   ITP Comments Virtual orientation call completed today. shehas an appointment on Date: 11/13/2021  for EP eval and gym Orientation.  Documentation of diagnosis can be found in [facility] Date: 08/25/2021 . Completed 6MWT and gym orientation. Initial ITP created and sent for review to Dr. Emily Filbert, Medical Director. First full day of exercise!  Patient was oriented to gym and equipment including functions, settings, policies, and procedures.  Patient's individual exercise prescription and treatment plan were reviewed.  All starting workloads were established based on the results of the 6 minute walk test done at initial orientation visit.  The plan for exercise progression was also introduced and progression will be customized based on patient's performance and goals. 30 Day review completed. Medical Director ITP review done, changes made as directed, and signed approval by Medical Director. Completed initial RD consultation    Row Name 12/30/21 [phone] 01/01/22 0743 01/29/22 0641 02/26/22 0743 03/10/22 1040   ITP Comments [name] missed part of last week due to multiple iron infusions in the ED and med adjustments.  She was also feeling washed out on Friday from all her appointments. 30 Day review completed. Medical Director ITP review done, changes made  as directed, and signed approval by Market researcher. 30 Day review completed. Medical Director ITP review done, changes made as directed, and signed approval by Medical Director. 30 Day review completed. Medical Director ITP review done, changes made as directed, and signed approval by Medical Director. Beyla graduated today from  rehab with 36 sessions completed.  Details of the patient's  exercise prescription and what She needs to do in order to continue the prescription and progress were discussed with patient.  Patient was given a copy of prescription and goals.  Patient verbalized understanding.  Shaquinta plans to continue to exercise by walking and using staff videos.            Comments: Discharge ITP

## 2022-03-10 NOTE — Progress Notes (Signed)
Discharge Summary: [name] (DOB 09-18-2070)  [name] graduated today from  rehab with 36 sessions completed.  Details of the patient's exercise prescription and what She needs to do in order to continue the prescription and progress were discussed with patient.  Patient was given a copy of prescription and goals.  Patient verbalized understanding.  Sarika plans to continue to exercise by walking and using staff videos.   [place] Name 11/13/21 1245 02/26/22 1205       6 Minute Walk   Phase Initial Discharge    Distance 985 feet 1306 feet    Distance % Change -- 32.6 %    Distance Feet Change -- 321 ft    Walk Time 6 minutes 6 minutes    # of Rest Breaks 0 0    MPH 1.86 2.47    METS 2.68 3.46    RPE 10 15    Perceived Dyspnea  1 --    VO2 Peak 9.41 12.11    Symptoms Yes (comment) Yes (comment)    Comments Slight SOB, unsteadiness with balance cramping in calves    Resting HR 81 bpm 78 bpm    Resting BP 103/64  automated BP cuff 82/43    Resting Oxygen Saturation  97 % 98 %    Exercise Oxygen Saturation  during 6 min walk 96 % 98 %    Max Ex. HR 105 bpm 135 bpm    Max Ex. BP 112/83  automated BP cuff 130/90    2 Minute Post BP 85/74  automated BP cuff; On entresto and denied dizziness/lightheadedness --

## 2022-03-13 ENCOUNTER — Inpatient Hospital Stay: Payer: BC Managed Care – PPO | Attending: Gynecologic Oncology | Admitting: Gynecologic Oncology

## 2022-03-13 ENCOUNTER — Encounter: Payer: Self-pay | Admitting: Gynecologic Oncology

## 2022-03-13 ENCOUNTER — Other Ambulatory Visit: Payer: Self-pay

## 2022-03-13 ENCOUNTER — Ambulatory Visit (HOSPITAL_COMMUNITY): Payer: Self-pay | Admitting: Pharmacist

## 2022-03-13 ENCOUNTER — Inpatient Hospital Stay: Payer: BC Managed Care – PPO

## 2022-03-13 ENCOUNTER — Ambulatory Visit: Payer: BC Managed Care – PPO | Admitting: Family Medicine

## 2022-03-13 VITALS — BP 89/62 | HR 76 | Temp 98.5°F | Ht 63.5 in | Wt 236.0 lb

## 2022-03-13 DIAGNOSIS — C569 Malignant neoplasm of unspecified ovary: Secondary | ICD-10-CM | POA: Diagnosis not present

## 2022-03-13 DIAGNOSIS — N939 Abnormal uterine and vaginal bleeding, unspecified: Secondary | ICD-10-CM

## 2022-03-13 DIAGNOSIS — D3910 Neoplasm of uncertain behavior of unspecified ovary: Secondary | ICD-10-CM

## 2022-03-13 DIAGNOSIS — Z7189 Other specified counseling: Secondary | ICD-10-CM

## 2022-03-13 LAB — POCT INR: INR: 1.6 — AB (ref 2.0–3.0)

## 2022-03-13 NOTE — Patient Instructions (Signed)
It was great to see you today!  You are healing very well from surgery.  Please remember, no heavy lifting for at least 6 weeks after surgery and nothing in the vagina for at least 8-10.  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  This will be something we will follow at your future visits.  I will let you know when I get your CT scan results back.  This will help establish a baseline for [name] moving forward if you start having any new symptoms.  Given your diagnosis, we will plan on visits every 6 months initially.  Please call back sometime in December or January to schedule a visit to see me in March.  If you develop any of the symptoms that we talked about today, please call to see me sooner.

## 2022-03-13 NOTE — Progress Notes (Signed)
Gynecologic Oncology Return Clinic Visit  03/13/22  Reason for Visit: Follow-up after surgery, treatment planning  Treatment History: 02/05/2022: Robotic assisted TLH with BSO for a complex adnexal mass, enlarged uterus  Interval History: Doing well.  Denies any abdominal or pelvic pain.  Denies any issues with her incision.  Reports regular bowel and bladder function.  Denies any vaginal bleeding or discharge.  Denies any dizziness or lightheadedness.  Checks her blood pressure at home and it is similar to what it is today.  Past Medical/Surgical History: Past Medical History:  Diagnosis Date   Automatic implantable cardioverter-defibrillator in situ    Chronic CHF (congestive heart failure) ([place])    a. EF 15-20% b. RHC (09/2013) RA 14, RV 57/22, PA 64/36 (48), PCWP 18, FIck CO/CI 3.7/1.6, PVR 8.1 WU, PA sat 47%    History of hypothyroidism    History of stomach ulcers    Hyperthyroidism    Hypotension    Morbid obesity ([place])    Myocardial infarction ([place]) 08/2013   Nocturnal dyspnea    Nonischemic cardiomyopathy ([place])    Sinus tachycardia    Sleep apnea    Snoring-prob OSA 09/04/2011   S[employer] Fidel[employer] ICD lead RECALL  6949    Stroke ([place]) 2021   some residual right-sided weakness   UARS (upper airway resistance syndrome) 09/04/2011   HST 12/2013:  AHI 4/hr (numerous episodes of airflow reduction that did not have concomitant desaturation)     Past Surgical History:  Procedure Laterality Date   BREATH TEK H PYLORI N/A 11/09/2014   Procedure: BREATH TEK H PYLORI;  Surgeon: Greer Pickerel, MD;  Location: [facility];  Service: General;  Laterality: N/A;   CARDIAC CATHETERIZATION  ~ 2006; 09/2013   CARDIAC CATHETERIZATION N/A 05/23/2016   Procedure: Right Heart Cath;  Surgeon: Larey Dresser, MD;  Location: [place] [facility];  Service: Cardiovascular;  Laterality: N/A;   CARDIAC DEFIBRILLATOR PLACEMENT  2006; 12/26/2013   Medtronic [id number]; 12-2013 ICD gen change and RV lead  revision with new 6935 RV lead by Dr [name]   CENTRAL LINE INSERTION  08/28/2021   Procedure: CENTRAL LINE INSERTION;  Surgeon: Larey Dresser, MD;  Location: [place] [facility];  Service: Cardiovascular;;   CESAREAN SECTION  1999   IMPLANTABLE CARDIOVERTER DEFIBRILLATOR GENERATOR CHANGE N/A 12/26/2013   Procedure: IMPLANTABLE CARDIOVERTER DEFIBRILLATOR GENERATOR CHANGE;  Surgeon: Deboraha Sprang, MD;  Location: [facility] CATH LAB;  Service: Cardiovascular;  Laterality: N/A;   INSERTION OF IMPLANTABLE LEFT VENTRICULAR ASSIST DEVICE N/A 09/03/2021   Procedure: INSERTION OF IMPLANTABLE LEFT VENTRICULAR ASSIST DEVICE/ Heartmate 3;  Surgeon: Dahlia Byes, MD;  Location: [place];  Service: Open Heart Surgery;  Laterality: N/A;   IR CT HEAD LTD  04/17/2020   IR CT HEAD LTD  04/17/2020   IR INTRA CRAN STENT  04/17/2020   IR PERCUTANEOUS ART THROMBECTOMY/INFUSION INTRACRANIAL INC DIAG ANGIO  04/17/2020   IR RADIOLOGIST EVAL & MGMT  06/05/2020   LEAD REVISION N/A 12/26/2013   Procedure: LEAD REVISION;  Surgeon: Deboraha Sprang, MD;  Location: [facility] CATH LAB;  Service: Cardiovascular;  Laterality: N/A;   PLACEMENT OF IMPELLA LEFT VENTRICULAR ASSIST DEVICE Right 08/29/2021   Procedure: PLACEMENT OF IMPELLA 5.5 LEFT VENTRICULAR ASSIST DEVICE;  Surgeon: Gaye Pollack, MD;  Location: [place];  Service: Open Heart Surgery;  Laterality: Right;   RADIOLOGY WITH ANESTHESIA N/A 04/17/2020   Procedure: Code Stroke;  Surgeon: Luanne Bras, MD;  Location: [place];  Service: Radiology;  Laterality: N/A;   REMOVAL OF IMPELLA LEFT VENTRICULAR ASSIST DEVICE N/A 09/03/2021   Procedure: REMOVAL OF IMPELLA LEFT VENTRICULAR ASSIST DEVICE;  Surgeon: Dahlia Byes, MD;  Location: [place];  Service: Open Heart Surgery;  Laterality: N/A;   RIGHT HEART CATH N/A 08/28/2021   Procedure: RIGHT HEART CATH;  Surgeon: Larey Dresser, MD;  Location: [place] CV LAB;  Service: Cardiovascular;  Laterality: N/A;   RIGHT HEART CATHETERIZATION N/A  02/15/2014   Procedure: RIGHT HEART CATH;  Surgeon: Larey Dresser, MD;  Location: [place] CATH LAB;  Service: Cardiovascular;  Laterality: N/A;   ROBOTIC ASSISTED TOTAL HYSTERECTOMY WITH BILATERAL SALPINGO OOPHERECTOMY N/A 02/05/2022   Procedure: XI ROBOTIC ASSISTED TOTAL HYSTERECTOMY WITH BILATERAL SALPINGO OOPHORECTOMY, CYSTOSCOPY, UTERUS GREATER THAN 250 GRAMS;  Surgeon: Lafonda Mosses, MD;  Location: [place];  Service: Gynecology;  Laterality: N/A;   TEE WITHOUT CARDIOVERSION N/A 08/29/2021   Procedure: TRANSESOPHAGEAL ECHOCARDIOGRAM (TEE);  Surgeon: Gaye Pollack, MD;  Location: [place];  Service: Open Heart Surgery;  Laterality: N/A;   TEE WITHOUT CARDIOVERSION N/A 09/03/2021   Procedure: TRANSESOPHAGEAL ECHOCARDIOGRAM (TEE);  Surgeon: Dahlia Byes, MD;  Location: [place];  Service: Open Heart Surgery;  Laterality: N/A;   TUBAL LIGATION  1999    Family History  Problem Relation Age of Onset   Heart disease Mother    Obesity Mother    High blood pressure Father    Stroke Father    Heart disease Maternal Grandmother    Colon cancer Neg Hx    Breast cancer Neg Hx    Ovarian cancer Neg Hx    Endometrial cancer Neg Hx    Pancreatic cancer Neg Hx    Prostate cancer Neg Hx     Social History   Socioeconomic History   Marital status: Married    Spouse name: Deidre Carino   Number of children: 2   Years of education: Not on file   Highest education level: Not on file  Occupational History   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  Tobacco Use   Smoking status: Never   Smokeless tobacco: Never  Vaping Use   Vaping Use: Never used  Substance and Sexual Activity   Alcohol use: No   Drug use: No   Sexual activity: Not Currently  Other Topics Concern   Not on file  Social History Narrative   [place] sometimes   Right handed   Social Determinants of Health   Financial Resource Strain: Low Risk  (08/28/2021)   Overall Financial Resource Strain (CARDIA)    Difficulty of Paying  Living Expenses: Not very hard  Food Insecurity: No Food Insecurity (08/28/2021)   Hunger Vital Sign    Worried About Running Out of Food in the Last Year: Never true    Ran Out of Food in the Last Year: Never true  Transportation Needs: No Transportation Needs (08/28/2021)   PRAPARE - Hydrologist (Medical): No    Lack of Transportation (Non-Medical): No  Physical Activity: Not on file  Stress: Not on file  Social Connections: Not on file    Current Medications:  Current Outpatient Medications:    aspirin EC 81 MG tablet, Take 1 tablet (81 mg total) by mouth daily. Swallow whole., Disp: 90 tablet, Rfl: 3   methimazole (TAPAZOLE) 10 MG tablet, Take 1 tablet (10 mg total) by mouth 2 (two) times daily., Disp: 180 tablet, Rfl: 1   potassium chloride SA (KLOR-CON  M) 20 MEQ tablet, Take 3 tablets (60 mEq total) by mouth daily., Disp: 270 tablet, Rfl: 3   predniSONE (DELTASONE) 10 MG tablet, Take 1 tablet (10 mg total) by mouth daily with breakfast., Disp: 90 tablet, Rfl: 1   quiniDINE gluconate 324 MG CR tablet, Take 1 tablet (324 mg total) by mouth 2 (two) times daily., Disp: 60 tablet, Rfl: 0   rosuvastatin (CRESTOR) 40 MG tablet, Take 1 tablet (40 mg total) by mouth at bedtime., Disp: 30 tablet, Rfl: 0   sildenafil (REVATIO) 20 MG tablet, Take 1 tablet (20 mg total) by mouth 3 (three) times daily., Disp: 90 tablet, Rfl: 6   spironolactone (ALDACTONE) 25 MG tablet, Take 1 tablet (25 mg total) by mouth daily. Restart on 02/10/22., Disp: 45 tablet, Rfl: 3   torsemide (DEMADEX) 20 MG tablet, Take 2 tablets (40 mg total) by mouth daily., Disp: 180 tablet, Rfl: 3   warfarin (COUMADIN) 2 MG tablet, Take 2 mg every Tuesday and 4 mg all other days or as directed by LVAD Clinic., Disp: 120 tablet, Rfl: 5   zinc gluconate 50 MG tablet, Take 1 tablet (50 mg total) by mouth daily., Disp: 30 tablet, Rfl: 6  Review of Systems: Denies appetite changes, fevers, chills, fatigue,  unexplained weight changes. Denies hearing loss, neck lumps or masses, mouth sores, ringing in ears or voice changes. Denies cough or wheezing.  Denies shortness of breath. Denies chest pain or palpitations. Denies leg swelling. Denies abdominal distention, pain, blood in stools, constipation, diarrhea, nausea, vomiting, or early satiety. Denies pain with intercourse, dysuria, frequency, hematuria or incontinence. Denies hot flashes, pelvic pain, vaginal bleeding or vaginal discharge.   Denies joint pain, back pain or muscle pain/cramps. Denies itching, rash, or wounds. Denies dizziness, headaches, numbness or seizures. Denies swollen lymph nodes or glands, denies easy bruising or bleeding. Denies anxiety, depression, confusion, or decreased concentration.  Physical Exam: BP (!) 89/62 (BP Location: Right Arm, Patient Position: Sitting) Comment: MD notified  Pulse 76   Temp 98.5 F (36.9 C) (Oral)   Ht 5' 3.5" (1.613 m)   Wt 236 lb (107 kg)   BMI 41.15 kg/m  General: Alert, oriented, no acute distress. HEENT: Normocephalic, atraumatic, sclera anicteric. Chest: Clear to auscultation bilaterally. No wheezes, rhonchi, or rales. Cardiovascular: Mechanical hum present from LVAD. Abdomen: Obese, soft, nontender.  Normoactive bowel sounds.  No masses or hepatosplenomegaly appreciated.  Well-healed visions. Extremities: Grossly normal range of motion.  Warm, well perfused.  No edema bilaterally. Skin: No rashes or lesions noted. GU: Normal appearing external genitalia without erythema, excoriation, or lesions.  Speculum exam reveals cuff intact, suture visible.  Bimanual exam reveals cuff intact, no fluctuance or tenderness with palpation.    Laboratory & Radiologic Studies: A. FALLOPIAN TUBE AND OVARY, LEFT, SALPINGO OOPHORECTOMY:  - Benign serous cystadenoma  - Incidental benign Brenner tumor  - Segment of benign unremarkable fallopian tube  - No evidence of malignancy   B. UTERUS AND  CERVIX, WITH RIGHT FALLOPIAN TUBE AND OVARY, HYSTERECTOMY:  - Granulosa cell tumor, adult-type, 1.2 cm  - Tumor is confined to ovary without any surface involvement  - Uterus with leiomyoma, 1cm  - Benign inactive endometrium  - Benign unremarkable cervix  - Benign unremarkable bilateral fallopian tubes  - See oncology table   Cytology FINAL MICROSCOPIC DIAGNOSIS:  - Atypical cells present  FINAL MICROSCOPIC DIAGNOSIS:  The atypical cells do not appear diagnostic of malignancy.  Assessment & Plan: [name] is  a 51 y.o. 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  Patient is overall doing well, postoperative milestones.  Discussed continued expectations and restrictions.  Patient was given a copy of her pathology report.  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  We will plan to get an inhibin B level today to establish baseline after surgery.  Given small tumor limited to the ovary, is low risk disease.  We will plan to get a CT abdomen and pelvis.  Discussed surveillance with the patient which would include visits every 6 to 12 months initially with exam and tumor marker.  We discussed that granulosa cell tumors can produce estrogen.  This may have contributed to her abnormal uterine bleeding.  We discussed signs and symptoms that would be concerning for recurrence of granulosa cell tumor.  I stressed the importance of calling if she develops any of these.  The patient's blood pressure is low today.  She reports that this is her normal since her heart surgery.  She checks her blood pressure at home and has been told by cardiology that as long as she is not dizzy, this is tolerable.  22 minutes of total time was spent for this patient encounter, including preparation, face-to-face counseling with the patient and coordination of care, and documentation of the encounter.  Jeral Pinch, MD  Division of Gynecologic Oncology  Department of Obstetrics and Gynecology  [facility]

## 2022-03-14 LAB — INHIBIN B: Inhibin B: <7 pg/mL

## 2022-03-18 ENCOUNTER — Ambulatory Visit
Admission: RE | Admit: 2022-03-18 | Discharge: 2022-03-18 | Disposition: A | Discharge status: Home / Self Care | Payer: BC Managed Care – PPO | Source: Ambulatory Visit | Attending: Gynecologic Oncology | Admitting: Gynecologic Oncology

## 2022-03-18 DIAGNOSIS — C569 Malignant neoplasm of unspecified ovary: Secondary | ICD-10-CM | POA: Diagnosis not present

## 2022-03-18 DIAGNOSIS — N200 Calculus of kidney: Secondary | ICD-10-CM | POA: Insufficient documentation

## 2022-03-18 DIAGNOSIS — D3910 Neoplasm of uncertain behavior of unspecified ovary: Secondary | ICD-10-CM | POA: Insufficient documentation

## 2022-03-18 DIAGNOSIS — I878 Other specified disorders of veins: Secondary | ICD-10-CM | POA: Diagnosis not present

## 2022-03-18 DIAGNOSIS — K3189 Other diseases of stomach and duodenum: Secondary | ICD-10-CM | POA: Diagnosis not present

## 2022-03-18 MED ORDER — IOHEXOL 300 MG/ML  SOLN
85.0000 mL | Freq: Once | INTRAMUSCULAR | Status: AC | PRN
  Administered 2022-03-18: 85 mL via INTRAVENOUS

## 2022-03-20 ENCOUNTER — Ambulatory Visit (HOSPITAL_COMMUNITY): Payer: Self-pay | Admitting: Pharmacist

## 2022-03-20 LAB — POCT INR: INR: 2 (ref 2.0–3.0)

## 2022-03-21 ENCOUNTER — Other Ambulatory Visit: Payer: Self-pay | Admitting: Adult Health

## 2022-03-26 ENCOUNTER — Other Ambulatory Visit (HOSPITAL_COMMUNITY): Payer: Self-pay | Admitting: Unknown Physician Specialty

## 2022-03-26 DIAGNOSIS — Z7901 Long term (current) use of anticoagulants: Secondary | ICD-10-CM

## 2022-03-26 DIAGNOSIS — Z95811 Presence of heart assist device: Secondary | ICD-10-CM

## 2022-03-27 ENCOUNTER — Encounter (HOSPITAL_COMMUNITY): Payer: Self-pay

## 2022-03-27 ENCOUNTER — Ambulatory Visit (HOSPITAL_COMMUNITY)
Admission: RE | Admit: 2022-03-27 | Discharge: 2022-03-27 | Disposition: A | Discharge status: Home / Self Care | Payer: BC Managed Care – PPO | Source: Ambulatory Visit | Attending: Internal Medicine | Admitting: Internal Medicine

## 2022-03-27 ENCOUNTER — Other Ambulatory Visit (HOSPITAL_COMMUNITY): Payer: Self-pay | Admitting: *Deleted

## 2022-03-27 ENCOUNTER — Other Ambulatory Visit (HOSPITAL_COMMUNITY): Payer: Self-pay

## 2022-03-27 ENCOUNTER — Ambulatory Visit (HOSPITAL_COMMUNITY): Payer: Self-pay | Admitting: Pharmacist

## 2022-03-27 VITALS — BP 95/75 | HR 86 | Wt 237.0 lb

## 2022-03-27 DIAGNOSIS — D3911 Neoplasm of uncertain behavior of right ovary: Secondary | ICD-10-CM | POA: Insufficient documentation

## 2022-03-27 DIAGNOSIS — I428 Other cardiomyopathies: Secondary | ICD-10-CM | POA: Insufficient documentation

## 2022-03-27 DIAGNOSIS — I472 Ventricular tachycardia, unspecified: Secondary | ICD-10-CM | POA: Diagnosis not present

## 2022-03-27 DIAGNOSIS — Z79899 Other long term (current) drug therapy: Secondary | ICD-10-CM | POA: Diagnosis not present

## 2022-03-27 DIAGNOSIS — I081 Rheumatic disorders of both mitral and tricuspid valves: Secondary | ICD-10-CM | POA: Diagnosis not present

## 2022-03-27 DIAGNOSIS — Z48812 Encounter for surgical aftercare following surgery on the circulatory system: Secondary | ICD-10-CM | POA: Diagnosis not present

## 2022-03-27 DIAGNOSIS — Z95811 Presence of heart assist device: Secondary | ICD-10-CM

## 2022-03-27 DIAGNOSIS — I5022 Chronic systolic (congestive) heart failure: Secondary | ICD-10-CM | POA: Diagnosis not present

## 2022-03-27 DIAGNOSIS — I130 Hypertensive heart and chronic kidney disease with heart failure and stage 1 through stage 4 chronic kidney disease, or unspecified chronic kidney disease: Secondary | ICD-10-CM | POA: Diagnosis not present

## 2022-03-27 DIAGNOSIS — E0590 Thyrotoxicosis, unspecified without thyrotoxic crisis or storm: Secondary | ICD-10-CM | POA: Insufficient documentation

## 2022-03-27 DIAGNOSIS — Z8673 Personal history of transient ischemic attack (TIA), and cerebral infarction without residual deficits: Secondary | ICD-10-CM | POA: Diagnosis not present

## 2022-03-27 DIAGNOSIS — Z79890 Hormone replacement therapy (postmenopausal): Secondary | ICD-10-CM | POA: Insufficient documentation

## 2022-03-27 DIAGNOSIS — Z7982 Long term (current) use of aspirin: Secondary | ICD-10-CM | POA: Insufficient documentation

## 2022-03-27 DIAGNOSIS — N183 Chronic kidney disease, stage 3 unspecified: Secondary | ICD-10-CM | POA: Diagnosis not present

## 2022-03-27 DIAGNOSIS — I480 Paroxysmal atrial fibrillation: Secondary | ICD-10-CM | POA: Diagnosis not present

## 2022-03-27 DIAGNOSIS — I493 Ventricular premature depolarization: Secondary | ICD-10-CM | POA: Diagnosis not present

## 2022-03-27 DIAGNOSIS — Z7901 Long term (current) use of anticoagulants: Secondary | ICD-10-CM

## 2022-03-27 DIAGNOSIS — Z7952 Long term (current) use of systemic steroids: Secondary | ICD-10-CM | POA: Insufficient documentation

## 2022-03-27 LAB — COMPREHENSIVE METABOLIC PANEL
ALT: 19 U/L (ref 0–44)
AST: 23 U/L (ref 15–41)
Albumin: 4.2 g/dL (ref 3.5–5.0)
Alkaline Phosphatase: 72 U/L (ref 38–126)
Anion gap: 10 (ref 5–15)
BUN: 26 mg/dL — ABNORMAL HIGH (ref 6–20)
CO2: 25 mmol/L (ref 22–32)
Calcium: 9.7 mg/dL (ref 8.9–10.3)
Chloride: 104 mmol/L (ref 98–111)
Creatinine, Ser: 1.42 mg/dL — ABNORMAL HIGH (ref 0.44–1.00)
GFR, Estimated: 45 mL/min — ABNORMAL LOW (ref >60)
Glucose, Bld: 90 mg/dL (ref 70–99)
Potassium: 3.6 mmol/L (ref 3.5–5.1)
Sodium: 139 mmol/L (ref 135–145)
Total Bilirubin: 0.5 mg/dL (ref 0.3–1.2)
Total Protein: 7.4 g/dL (ref 6.5–8.1)

## 2022-03-27 LAB — CBC
HCT: 38.2 % (ref 36.0–46.0)
Hemoglobin: 11.7 g/dL — ABNORMAL LOW (ref 12.0–15.0)
MCH: 24 pg — ABNORMAL LOW (ref 26.0–34.0)
MCHC: 30.6 g/dL (ref 30.0–36.0)
MCV: 78.3 fL — ABNORMAL LOW (ref 80.0–100.0)
Platelets: 264 10*3/uL (ref 150–400)
RBC: 4.88 MIL/uL (ref 3.87–5.11)
RDW: 15.5 % (ref 11.5–15.5)
WBC: 6.2 10*3/uL (ref 4.0–10.5)
nRBC: 0 % (ref 0.0–0.2)

## 2022-03-27 LAB — PROTIME-INR
INR: 2 — ABNORMAL HIGH (ref 0.8–1.2)
Prothrombin Time: 22.4 seconds — ABNORMAL HIGH (ref 11.4–15.2)

## 2022-03-27 LAB — LACTATE DEHYDROGENASE: LDH: 191 U/L (ref 98–192)

## 2022-03-27 LAB — PREALBUMIN: Prealbumin: 23 mg/dL (ref 18–38)

## 2022-03-27 MED ORDER — QUINIDINE GLUCONATE ER 324 MG PO TBCR
324.0000 mg | EXTENDED_RELEASE_TABLET | Freq: Two times a day (BID) | ORAL | 6 refills | Status: DC
  Filled 2022-03-27: qty 60, 30d supply, fill #0

## 2022-03-27 NOTE — Progress Notes (Addendum)
Patient presents for 1 month f/u with 6 month Intermacs in [facility] today alone. Reports no problems with VAD equipment or concerns with drive line.  Pt states that she is feeling great since last visit. Denies lightheadedness, dizziness, falls, shortness of breath, and signs of bleeding. Has had no vaginal bleeding.   Tolerating Torsemide 40 mg daily with K 60 meq daily. Wt down 2 lbs today. Fluid status stable.   Pt had f/u with Dr Berline Lopes 03/13/22. Per her note:   " Patient was given a copy of her pathology report. 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  We will plan to get an inhibin B level today to establish baseline after surgery.  Given small tumor limited to the ovary, is low risk disease.  We will plan to get a CT abdomen and pelvis.  Discussed surveillance with the patient which would include visits every 6 to 12 months initially with exam and tumor marker."  CT abd/pelvis results: No evidence of metastatic disease.  Reports a lump was found during recent mammogram. This was found to be benign. Will continue yearly mammograms.   Blister previously noted under drive line dressing has completely healed. [name] is changing her dressing weekly using weekly kit.   Pt has completed cardiac rehab at [facility]. She is interested in participating in their maintenance program. Referral sent today.   Reports she is having difficulties obtaining Quinidine from her pharmacy as medication is on nationwide backorder. Discussed pt with [facility]. They are able to order medication from one of their wholesalers, but did advise that medication may remain difficult to obtain. They will be able to ship medication to pt next week. Pt reports she has at least a 10 day supply left. Dr [name] made aware of the above. Will continue Quinidine for now. Per Dr [name] will reach out to Miranda Kilts PA and Dr [name] at the device clinic to  discuss Quinidine shortage.   Vital Signs:  Doppler Pressure: 86  Automatc BP: 95/75 (83) HR: 86 SR SPO2: 100% on RA   Weight: 237 lbs w/o eqt Last weight: 239 lb w/o eqt Home weights:  lbs  VAD Indication: Destination Therapy d/t BMI   VAD interrogation & Equipment Management: Speed: 5900 Flow: 5.3 Power: 4.9 w    PI: 3.1   Alarms: none Events: 20 - 40 daily  Fixed speed 5900 Low speed limit: 5600   Primary Controller:  Replace back up battery in 24 months. Back up controller:   Replace back up battery in 30 months.    Annual Equipment Maintenance on UBC/PM was performed on 09/03/21.    I reviewed the LVAD parameters from today and compared the results to the patient's prior recorded data. LVAD interrogation was NEGATIVE for significant power changes, NEGATIVE for clinical alarms and STABLE for PI events/speed drops. No programming changes were made and pump is functioning within specified parameters. Pt is performing daily controller and system monitor self tests along with completing weekly and monthly maintenance for LVAD equipment.   LVAD equipment check completed and is in good working order. Back-up equipment present. Charged back up battery and performed self-test on equipment.    Exit Site Care: Drive line is being maintained weekly by her husband [name]. Existing VAD dressing clean, dry, and intact. Drive line anchor correctly applied. Pt denies fever or chills. Continue weekly dressing changes using weekly kit Provided with 8 weekly kits  and 2 boxes of large tegaderms for home use.   Significant Events on VAD Support:    Device: Medtronic Therapies: on VF 240 VT 200 VVI: 40 Last check: 11/26/21   BP & Labs:  Doppler 86 - reflecting MAP   Hgb 11.7 - No S/S of bleeding. Specifically denies vaginal bleeding, melena/BRBPR or nosebleeds.    LDH stable at 23 with established baseline of 250 - 450. Denies tea-colored urine. No power elevations noted on  interrogation.   6 month Intermacs follow up completed including:  Quality of Life, KCCQ-12, and Neurocognitive trail making.   Pt completed 1375 feet during 6 minute walk.  Back up controller: 11V backup battery charged during this visit.  Patient Goals: Would like to continue exercising with the cardiac rehab maintenance program. She would like to continue losing weight, for a goal BMI <35.   Wilson Cardiomyopathy Questionnaire     03/27/2022    1:09 PM 11/27/2021   10:49 AM  KCCQ-12  1 a. Ability to shower/bathe Not at all limited Not at all limited  1 b. Ability to walk 1 block Slightly limited Moderately limited  1 c. Ability to hurry/jog Moderately limited Other, Did not do  2. Edema feet/ankles/legs Less than once a week Never over the past 2 weeks  3. Limited by fatigue Less than once a week 1-2 times a week  4. Limited by dyspnea Less than once a week Never over the past 2 weeks  5. Sitting up / on 3+ pillows Never over the past 2 weeks Never over the past 2 weeks  6. Limited enjoyment of life Not limited at all Not limited at all  7. Rest of life w/ symptoms Completely satisfied Completely satisfied  8 a. Participation in hobbies Did not limit at all Did not limit at all  8 b. Participation in chores Did not limit at all Slightly limited  8 c. Visiting family/friends Did not limit at all Did not limit at all    Plan:  No medication changes  Coumadin dosing per [name] Return to clinic in 2 months  [name] RN [place] Coordinator  Office: [phone]  24/7 Pager: [phone]    ID:  ZAMAYA RAPAPORT, DOB 05-12-1971, MRN [id number]    Provider location:  Advanced Heart Failure Type of Visit: Established patient    PCP:  Miranda Pitch, MD    Cardiologist:  Miranda Champagne, MD  History of Present Illness: [name] is a 51 y.o. female who has a history of morbid obesity, HTN, negative for sleep apnea, and systolic HF due to nonischemic  cardiomyopathy s/p Medtronic ICD (2006).  She was followed for systolic CHF initially in [place]. Apparently her EF recovered to 35-40%. However, she was admitted to [facility] 09/19/13 for worsening dyspnea and CP. Coronaries were reportedly OK on LHC at that time at [facility]. Echo showed EF of 15-20% with four-chamber enlargement with moderate-severe TR/MR. RHC as below. Rheumatological w/u negative.  CPX in 9/16 showed near-normal functional capacity.  Repeat echo in 6/17 shows that EF remains 20%. Repeat CPX in 1/18 was submaximal but probably only mild circulatory limitation.  Echo 8/18 showed EF 25-30%, severe LV dilation.  CPX in 6/19 showed low normal functional capacity.  Echo in 10/19 showed EF 20-25%, moderate LV dilation.    Zio patch in 2/20 with 11% PVCs and NSVT, amiodarone started. Zio patch in 3/20 with 13.3% PVCs and NSVT.  She saw Dr. Caryl Clark, he thought  that increased PVCs were electromechanical in nature due to worsening heart failure.  Repeat Zio patch on amiodarone in 1/21 showed 6.2% PVCs.   She was admitted in 4/20 with VT in setting of hypokalemia.    CPX in 9/20 showed mild functional limitation, more due to body habitus than heart failure.  Echo in 4/21 showed EF 20% with severe LV dilation, diffuse hypokinesis, normal RV size and systolic function.   In 8/21, she had a shock for VF in the setting of hypokalemia.   Patient was admitted in 10/21 with CVA involving left MCA territory.  This was thought to be cardioembolic but LV thrombus was not visualized and no atrial fibrillation has been seen.  Echo in 10/21 showed EF 20-25%, severe LV dilation (no LV thrombus seen), mildly decreased RV systolic function, mild-moderate MR. She was taken by IR for thrombectomy and stent placement, now on ASA 81 and ticagrelor.   She had recurrent VT in 2/22, quinidine was added by EP.  Spironolactone and Coreg were decreased due to low BP and lightheadedness.    Echo in 4/22 showed EF 25% with  severe LV dilation and global hypokinesis, normal RV, mild-moderate MR.   CPX (8/22) showed peak VO2 14.3, VE/VCO2 slope 42, RER 1.09 => mild to moderate HF limitation, elevated VE/VCO2 slope is likely false from end exercise hyperventilation. Restrictive lung physiology from obesity.   Patient was admitted in 3/23 with recurrent CHF exacerbation with low cardiac output.  She was also noted to be hyperthyroidism but amiodarone had to be continued due to VT and poorly tolerated atrial fibrillation.  She required Impella 5.5 placement and eventual HM3 LVAD placement on 09/04/21.  Post-op course complicated by atrial fibrillation, malnutrition, and deconditioning.  She went to CIR prior to discharge.   Patient developed heavy vaginal bleeding in 7/23, she was seen in the ER and required 2 units PRBCs.  She was admitted in 8/23 and had hysterectomy + BSO, LVAD speed increased to 5900 rpm on ramp echo.  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    She returns for followup of CHF and LVAD.  She has been doing well recently.  Weight down 2 lbs.  MAP 83.  No significant dyspnea walking on flat ground.  No lightheadedness.  Driveline site looks good.  She has finished cardiac rehab and wants to go to maintenance rehab.   LVAD interrogation: Please see LVAD nursing note above.   Labs: (11/09/13): Dig level 0.5, K 3.6, Creatinine 0.83, pro-BNP 622 (12/19/13): K 3.6, creatinine 0.8 (7/15): K 4.1, creatinine 0.91 (8/15): K 3.9, creatinine 0.91 (10/15): K 4, creatinine 0.93, proBNP 129 (12/15): K 4, creatinine 0.9, digoxin 0.4 (5/16): K 4, creatinine 0.86 (9/16): K 3.6, creatinine 0.86, digoxin < 0.2 (5/17): K 4.1, creatinine 0.95, digoxin 0.3 (5/18): K 3.4, creatinine 0.95, TSH elevated but free T3 and free T4 normal.  (8/18): K 3.9, creatinine 0.87, BNP 108  (11/18): K 3.5, creatinine 1.04, digoxin 0.4 (1/19): LDL 91, HDL 47 (3/19): digoxin 0.7, K 4, creatinine  0.99 (4/19): TSH normal, LDL 95 (9/19): LDL 99, HDL 45, K 4, creatinine 0.96 (10/19): K 3.6, creatinine 0.85, digoxin 0.5 (2/20): LDL 103 (3/20): K 3.9, creatinine 1.05 (4/20): K 3.9, creatinine 1.23 => 1.15, TSH 8, digoxin 0.3, LFTs normal 6/20): K 4.3, creatinine 1.07, LFTs normal, digoxin 0.5 (7/20): K 3.8, creatinine 1.16 (8/20): digoxin < 0.2, LDL 135 (1/21): K 3.9, creatinine 1.05, LDL  171  (4/21): K 4.2, creatinine 0.95, LDL 127, TSH normal, LFTs normal (10/21): K 2.8, creatinine 1.21, LDL 130 (11/21): K 4.5, creatinine 1.3 => 1.2, LFTs normal, digoxin 1.3, TSH normal (3/22): K 4.1, creatinine 1.36 (4/22): hgb 10.8 (7/22): Transferrin 8%, digoxin 0.9, hgb 10.1, K 3.9, creatinine 1.42 (8/22): digoxin 0.4 (9/22): K 4.2, creatinine 1.32 (4/23): hgb 11, WBCs 13, K 3.9 => 3.8, creatinine 0.9 => 0.98, albumin 3.2, ALT 45, AST 31, LDH 272 => 244, TSH 0.235, free T4 1.84 (7/23): hgb 8, TSH normal 8/23): K 3.8, creatinine 1.15, hgb 11.4 (9/23): K 3.7, creatinine 1.36  PMH: 1. OSA: Using CPAP.  2. PVCs:  - Zio patch 2/20: 11% PVCs - Zio patch 3/20: 13.3% PVCs - Zio patch 1/21: 6.2% PVCs 3. VT/VF: 8/21, in setting of hypokalemia.  2/22 VT, quinidine added.  4. 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  No LV thrombus visualized and atrial fibrillation has not been visualized.  - IR thrombectomy with stent placement in 10/21.  5. Chronic systolic CHF: Nonischemic cardiomyopathy:   - Coronary angiography 2015 at [facility]: Normal coronaries.  - Echo (8/15) with EF 20%, moderate LV dilation, diffuse hypokinesis, normal RV size with mildly decreased systolic function, mild MR.  - Echo (5/16) with EF 20%, spherical LV with diffuse hypokinesis, mild MR.  - Echo (6/17) with EF 20%, diffuse hypokinesis, moderate LV dilation, normal RV, mild to moderate MR.  - RHC (12/17): mean RA 5, PA 56/31, mean PCWP 24, CI 2, PVR 3.7 WU - Echo (8/18) with EF 25-30%, severe LV dilation, severe LAE.  - Echo (2/20) with  EF 20-25%, moderate LV dilation, moderate MR.  - Echo (4/21) with EF 20-25%, severe LV dilation, mild-moderate MR, normal RV, PASP 26 mmHg.  - Echo (10/21) with EF 20-25%, severe LV dilation (no LV thrombus seen), mildly decreased RV systolic function, mild-moderate MR. - CPX in 9/20 showed mild functional limitation, more due to body habitus than heart failure.  - CPX in 8/21: Peak VO2 11.6, VE/VCO2 32, RER 1.2.  Mild HF limitation.  - Echo (4/22): EF 25% with severe LV dilation and global hypokinesis, normal RV, mild-moderate MR.  - CPX (8/22): peak VO2 14.3, VE/VCO2 slope 42, RER 1.09 => mild to moderate HF limitation, elevated VE/VCO2 slope is likely false from end exercise hyperventilation. Restrictive lung physiology from obesity.  - HM3 LVAD placement 09/04/21.  6. PFTs (1/22): Near normal.  7. Fe deficiency anemia 8. 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  10. Left ovarian mass -  hysterectomy + BSO.  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      Current Outpatient Medications  Medication Sig Dispense Refill   aspirin EC 81 MG tablet Take 1 tablet (81 mg total) by mouth daily. Swallow whole. 90 tablet 3   methimazole (TAPAZOLE) 10 MG tablet Take 1 tablet (10 mg total) by mouth 2 (two) times daily. 180 tablet 1   potassium chloride SA (KLOR-CON M) 20 MEQ tablet Take 3 tablets (60 mEq total) by mouth daily. 270 tablet 3   predniSONE (DELTASONE) 10 MG tablet Take 1 tablet (10 mg total) by mouth daily with breakfast. 90 tablet 1   rosuvastatin (CRESTOR) 40 MG tablet TAKE 1 TABLET BY MOUTH ONCE DAILY 30 tablet 2   sildenafil (REVATIO) 20 MG tablet Take 1 tablet (20 mg total) by mouth 3 (three) times daily. 90 tablet 6   spironolactone (ALDACTONE) 25  MG tablet Take 1 tablet (25 mg total) by mouth daily. Restart on 02/10/22. 45 tablet 3   torsemide (DEMADEX) 20 MG tablet Take 2 tablets (40 mg total) by mouth  daily. 180 tablet 3   warfarin (COUMADIN) 2 MG tablet Take 2 mg every Tuesday and 4 mg all other days or as directed by LVAD Clinic. 120 tablet 5   quiniDINE gluconate 324 MG CR tablet Take 1 tablet (324 mg total) by mouth 2 (two) times daily. 60 tablet 6   zinc gluconate 50 MG tablet Take 1 tablet (50 mg total) by mouth daily. (Patient not taking: Reported on 03/27/2022) 30 tablet 6   No current facility-administered medications for this encounter.    Allergies:   Patient has no known allergies.   Social History:  The patient  reports that she has never smoked. She has never used smokeless tobacco. She reports that she does not drink alcohol and does not use drugs.   Family History:  The patient's family history includes Heart disease in her maternal grandmother and mother; High blood pressure in her father; Obesity in her mother; Stroke in her father.   ROS:  Please see the history of present illness.   All other systems are personally reviewed and negative.   Exam:   BP 95/75 Comment: map 83  Pulse 86   Wt 107.5 kg (237 lb)   SpO2 100%   BMI 41.32 kg/m  MAP 84 General: Well appearing this am. NAD.  HEENT: Normal. Neck: Supple, JVP 8 cm. Carotids OK.  Cardiac:  Mechanical heart sounds with LVAD hum present.  Lungs:  CTAB, normal effort.  Abdomen:  NT, ND, no HSM. No bruits or masses. +BS  LVAD exit site: Well-healed and incorporated. Dressing dry and intact. No erythema or drainage. Stabilization device present and accurately applied. Driveline dressing changed daily per sterile technique. Extremities:  Warm and dry. No cyanosis, clubbing, rash, or edema.  Neuro:  Alert & oriented x 3. Cranial nerves grossly intact. Moves all 4 extremities w/o difficulty. Affect pleasant    Recent Labs: 09/17/2021: B Natriuretic Peptide 279.4 12/26/2021: TSH 5.40 02/04/2022: Magnesium 1.9 03/27/2022: ALT 19; BUN 26; Creatinine, Ser 1.42; Hemoglobin 11.7; Platelets 264; Potassium 3.6; Sodium 139   Personally reviewed   Wt Readings from Last 3 Encounters:  03/27/22 107.5 kg (237 lb)  03/13/22 107 kg (236 lb)  02/26/22 110.5 kg (243 lb 11.2 oz)    ASSESSMENT AND PLAN:   1. Chronic systolic HF: Nonischemic cardiomyopathy.  She has a Medtronic ICD. Cause for cardiomyopathy is uncertain, initially identified peri-partum (after son's birth), so cannot rule out peri-partum CMP.  On and off, she has had frequent PVCs.   RHC in 8/15 showed normal filling pressures and preserved cardiac output. Echo in 4/22 with EF 25%, severe LV dilation.  CPX (8/22) with mild-moderate HF limitation.  Echo in 3/23 with EF < 20% with severe dilation, normal RV size with mildly decreased systolic function, moderate-severe functional MR with dilated IVC. 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 Impella 5.5 placed 08/29/21.  Patient had HM3 LVAD placed 09/04/21 and speed adjusted by ramp echo up to 5800 rpm and again up to 5900 rpm. LVAD interrogation shows stable parameters today.  Weight is down 2 lbs.  She is not volume overloaded, NYHA class II symptoms.   - Continue torsemide 40 mg  daily and KCl 60 daily.  BMET today.  - Continue spironolactone 25 mg daily.   - She should continue ASA 81 mg daily with history of CVA.  - Continue sildenafil 20 mg tid  - Warfarin for VAD, goal INR 2-2.5.   2. 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  She had been on Levoxyl at home prior to 3/23 admission, noted to be hyperthyroid in 3/23.  This likely contributes to her CHF. She had recurrent VT and poorly tolerated AF which made it difficult for [name] to stop amiodarone. Initially treated w/ IV Solumedrol  -> transitioned to prednisone and methimazole.  Endocrinology following, prednisone being titrated down.  She is off amiodarone.  - Management per  endocrinology => has appt in November 3. PVCs/VT: EP consulted given complicated situation with hyperthyroidism and VT.  - Continue quinidine.  - Now off amiodarone.  4. CVA: CVA in 10/21, had thrombectomy with stent placement.  No LV thrombus noted and no atrial fibrillation noted.  - Would continue ASA 81 daily.   5. Anemia: Stable hgb recently.  6. CKD stage 3: Creatinine recently improved.   - BMET today.  7. Mitral regurgitation: Moderate-severe functional MR pre-LVAD.  Unlikely to be Mitraclip candidate with severely dilated LV (>7 cm). Mitral regurgitation was mild on 8/23 echo.  8. Left ovarian mass: Hysterectomy + BSO.  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   9. Atrial fibrillation: Tolerates poorly.   - Watch closely off amiodarone.   Followup in 2 months.   Miranda Champagne, MD  03/27/2022  [facility] [address] [facility] and [place] [address] [phone] (office) [phone] (fax)

## 2022-03-27 NOTE — Patient Instructions (Signed)
No medication changes ?Coumadin dosing per [name] ?Return to clinic in 2 months  ?

## 2022-03-28 ENCOUNTER — Other Ambulatory Visit (HOSPITAL_COMMUNITY): Payer: Self-pay

## 2022-03-28 DIAGNOSIS — R5381 Other malaise: Secondary | ICD-10-CM | POA: Diagnosis not present

## 2022-04-02 ENCOUNTER — Other Ambulatory Visit (HOSPITAL_COMMUNITY): Payer: Self-pay

## 2022-04-03 ENCOUNTER — Ambulatory Visit (HOSPITAL_COMMUNITY): Payer: Self-pay | Admitting: Pharmacist

## 2022-04-03 LAB — POCT INR: INR: 1.4 — AB (ref 2.0–3.0)

## 2022-04-07 DIAGNOSIS — Z0289 Encounter for other administrative examinations: Secondary | ICD-10-CM

## 2022-04-09 ENCOUNTER — Ambulatory Visit (INDEPENDENT_AMBULATORY_CARE_PROVIDER_SITE_OTHER): Payer: BC Managed Care – PPO | Admitting: Family Medicine

## 2022-04-09 ENCOUNTER — Other Ambulatory Visit (INDEPENDENT_AMBULATORY_CARE_PROVIDER_SITE_OTHER): Payer: Self-pay | Admitting: Family Medicine

## 2022-04-09 ENCOUNTER — Encounter (INDEPENDENT_AMBULATORY_CARE_PROVIDER_SITE_OTHER): Payer: Self-pay | Admitting: Family Medicine

## 2022-04-09 VITALS — HR 57 | Temp 98.2°F | Ht 63.0 in | Wt 243.0 lb

## 2022-04-09 DIAGNOSIS — I5022 Chronic systolic (congestive) heart failure: Secondary | ICD-10-CM | POA: Diagnosis not present

## 2022-04-09 DIAGNOSIS — R5383 Other fatigue: Secondary | ICD-10-CM | POA: Diagnosis not present

## 2022-04-09 DIAGNOSIS — E8940 Asymptomatic postprocedural ovarian failure: Secondary | ICD-10-CM | POA: Diagnosis not present

## 2022-04-09 DIAGNOSIS — Z1331 Encounter for screening for depression: Secondary | ICD-10-CM | POA: Diagnosis not present

## 2022-04-09 DIAGNOSIS — E669 Obesity, unspecified: Secondary | ICD-10-CM

## 2022-04-09 DIAGNOSIS — E46 Unspecified protein-calorie malnutrition: Secondary | ICD-10-CM

## 2022-04-09 DIAGNOSIS — R0602 Shortness of breath: Secondary | ICD-10-CM

## 2022-04-09 DIAGNOSIS — E0590 Thyrotoxicosis, unspecified without thyrotoxic crisis or storm: Secondary | ICD-10-CM

## 2022-04-09 DIAGNOSIS — Z6841 Body Mass Index (BMI) 40.0 and over, adult: Secondary | ICD-10-CM

## 2022-04-10 ENCOUNTER — Ambulatory Visit (HOSPITAL_COMMUNITY): Payer: Self-pay | Admitting: Pharmacist

## 2022-04-10 LAB — HEMOGLOBIN A1C
Est. average glucose Bld gHb Est-mCnc: 108 mg/dL
Hgb A1c MFr Bld: 5.4 % (ref 4.8–5.6)

## 2022-04-10 LAB — LIPID PANEL
Chol/HDL Ratio: 2.8 ratio (ref 0.0–4.4)
Cholesterol, Total: 171 mg/dL (ref 100–199)
HDL: 62 mg/dL (ref >39)
LDL Chol Calc (NIH): 94 mg/dL (ref 0–99)
Triglycerides: 82 mg/dL (ref 0–149)
VLDL Cholesterol Cal: 15 mg/dL (ref 5–40)

## 2022-04-10 LAB — INSULIN, RANDOM: INSULIN: 17.8 u[IU]/mL (ref 2.6–24.9)

## 2022-04-10 LAB — VITAMIN D 25 HYDROXY (VIT D DEFICIENCY, FRACTURES): Vit D, 25-Hydroxy: 19.1 ng/mL — ABNORMAL LOW (ref 30.0–100.0)

## 2022-04-10 LAB — VITAMIN B12: Vitamin B-12: 616 pg/mL (ref 232–1245)

## 2022-04-10 LAB — POCT INR: INR: 2.4 (ref 2.0–3.0)

## 2022-04-10 LAB — FOLATE: Folate: 4.6 ng/mL (ref >3.0)

## 2022-04-14 ENCOUNTER — Telehealth: Payer: Self-pay | Admitting: *Deleted

## 2022-04-14 NOTE — Telephone Encounter (Signed)
Referral was sent for maintenance cardiac rehab at [facility] per pt request. Per Wynona Canes at [facility] cardiac rehab they do not have a maintenance program since reopening after Covid shutdown. They have a community gym [facility] [employer]) that many of their program graduates attend. [phone]  Spoke with [name] regarding the above. She will reach out to [facility] and discuss membership options.   [name] RN [place] Coordinator  Office: [phone]  24/7 Pager: [phone]

## 2022-04-17 ENCOUNTER — Ambulatory Visit (HOSPITAL_COMMUNITY): Payer: Self-pay | Admitting: Pharmacist

## 2022-04-17 LAB — POCT INR: INR: 2.2 (ref 2.0–3.0)

## 2022-04-21 NOTE — Progress Notes (Unsigned)
Chief Complaint:   [name] (MR# [id number]) is a 51 y.o. female who presents for evaluation and treatment of Miranda and related comorbidities. Current BMI is Body mass index is 43.05 kg/m. [name] has been struggling with her weight for many years and has been unsuccessful in either losing weight, maintaining weight loss, or reaching her healthy weight goal.  She is restarting our program, she was last seen in July 2021.  Her weight is stable.  She is motivated to lose weight for a heart transplant.  Using LVAD for 6 months.  Prior to that she had a lot of fatigue and DOE.  She denies a big appetite.  She eats boiled eggs, bacon in the morning, lean cuisine for lunch. She cooks for her family at night. She limits greens due to Warfarin.  She craves more sweets at night.  She has some fruit for a snack.  She drinks more water.  She has been on prednisone for 5 months.  She has a fairlife shake daily.   Brea is currently in the action stage of change and ready to dedicate time achieving and maintaining a healthier weight. [name] is interested in becoming our patient and working on intensive lifestyle modifications including (but not limited to) diet and exercise for weight loss.  [name]'s habits were reviewed today and are as follows: Her family eats meals together, she thinks her family will eat healthier with her, her desired weight loss is 53 lbs, she has been heavy most of her life, she started gaining weight after having her last child, her heaviest weight ever was 260 pounds, she has significant food cravings issues, she snacks frequently in the evenings, and she is frequently drinking liquids with calories.  Depression Screen [name]'s Food and Mood (modified PHQ-9) score was 4.     04/09/2022   10:04 AM  Depression screen PHQ 2/9  Decreased Interest 1  Down, Depressed, Hopeless 0  PHQ - 2 Score 1  Altered sleeping 0  Tired, decreased energy 1  Change in appetite 1   Feeling bad or failure about yourself  0  Trouble concentrating 0  Moving slowly or fidgety/restless 1  Suicidal thoughts 0  PHQ-9 Score 4  Difficult doing work/chores Not difficult at all   Subjective:   1. Other fatigue She is on multiple medications.  She had anemia post hysterectomy.  She is using LVAD.  [name] admits to daytime somnolence and admits to waking up still tired. Patient has a history of symptoms of N/A. [name] generally gets 8 hours of sleep per night, and states that she has generally restful sleep. Snoring is not present. Apneic episodes are not present. Epworth Sleepiness Score is 4.   2. SOBOE (shortness of breath on exertion) [name] notes increasing shortness of breath with exercising and seems to be worsening over time with weight gain. She notes getting out of breath sooner with activity than she used to. This has not gotten worse recently. [name] denies shortness of breath at rest or orthopnea.  3. Chronic systolic heart failure [facility]) She is being managed by Dr [name].  She has been using the LVAD for 5-6 months.  She is on Torsemide 40 mg daily and spironolactone 25 mg daily.    4. Hyperthyroidism She is being managed by Dr Phoebe Perch.  She is on Methimazole 10 mg BID.  Her last TSH was at goal.  She has follow up in November.  She has had heart palpitations.  5. Postsurgical menopause Hysterectomy with oophorectomy this year for cancer.  She has had abnormal bleeding that caused anemia.  She was having vasomotor flushing.   6. Malnutrition, unspecified type [facility]) She was diagnosed int he spring with inadequate food intake, it is improving. She has increased her intake of protein, eggs, protein shakes, lean cuisine and added in some fruit.  Prealbumin 1015 improved to 23.  Assessment/Plan:   1. Other fatigue Tari does feel that her weight is causing her energy to be lower than it should be. Fatigue may be related to Miranda, depression or many other causes.  Labs will be ordered, and in the meanwhile, [name] will focus on self care including making healthy food choices, increasing physical activity and focusing on stress reduction. Labs reviewed and updated today.   - VITAMIN D 25 Hydroxy (Vit-D Deficiency, Fractures) - Lipid panel - Insulin, random - Hemoglobin A1c - Folate - Vitamin B12  2. SOBOE (shortness of breath on exertion) [name] does feel that she gets out of breath more easily that she used to when she exercises. [name]'s shortness of breath appears to be Miranda related and exercise induced. She has agreed to work on weight loss and gradually increase exercise to treat her exercise induced shortness of breath. Will continue to monitor closely.  3. Chronic systolic heart failure ([place]) She needs BMI to be <35 for heart transplant.  She will begin actively working on weight loss.   4. Hyperthyroidism Continue current plan of care per endocrinology.   5. Postsurgical menopause She is not a candidate for HRT due to CVA.  Symptom management only.   6. Malnutrition, unspecified type ([place]) She is to keep on fairlife protein shake per day as a snack and Category meal plan.   7. Depression screening [name] had a negative depression screening. Depression is commonly associated with Miranda and often results in emotional eating behaviors. We will monitor this closely and work on CBT to help improve the non-hunger eating patterns. Referral to Psychology may be required if no improvement is seen as she continues in our clinic.  8. Miranda, current BMI 43.1 [name] is currently in the action stage of change and her goal is to continue with weight loss efforts. I recommend Elianie begin the structured treatment plan as follows:  She has agreed to the Category 2 Plan+90 protein daily.   Exercise goals:  walking 20 minutes 4-5 times per week.     Behavioral modification strategies: increasing lean protein intake, decreasing eating out, and  decreasing junk food.  She was informed of the importance of frequent follow-up visits to maximize her success with intensive lifestyle modifications for her multiple health conditions. She was informed we would discuss her lab results at her next visit unless there is a critical issue that needs to be addressed sooner. [name] agreed to keep her next visit at the agreed upon time to discuss these results.  Objective:   Pulse (!) 57, temperature 98.2 F (36.8 C), height '5\' 3"'$  (1.6 m), weight 243 lb (110.2 kg), SpO2 98 %. Body mass index is 43.05 kg/m.  EKG: Normal sinus rhythm, rate 88 BPM.  Indirect Calorimeter completed today shows a VO2 of 270 and a REE of 1858.  Her calculated basal metabolic rate is 7510 thus her basal metabolic rate is better than expected.  General: Cooperative, alert, well developed, in no acute distress. HEENT: Conjunctivae and lids unremarkable. Cardiovascular: Regular rhythm.  Lungs: Normal work of breathing. Neurologic: No focal deficits.  Lab Results  Component Value Date   CREATININE 1.42 (H) 03/27/2022   BUN 26 (H) 03/27/2022   NA 139 03/27/2022   K 3.6 03/27/2022   CL 104 03/27/2022   CO2 25 03/27/2022   Lab Results  Component Value Date   ALT 19 03/27/2022   AST 23 03/27/2022   ALKPHOS 72 03/27/2022   BILITOT 0.5 03/27/2022   Lab Results  Component Value Date   HGBA1C 5.4 04/09/2022   HGBA1C 5.1 08/27/2021   HGBA1C 5.5 04/18/2020   HGBA1C 5.3 12/01/2019   HGBA1C 5.3 07/04/2019   Lab Results  Component Value Date   INSULIN 17.8 04/09/2022   INSULIN 16.6 12/01/2019   INSULIN 19.8 07/04/2019   INSULIN 26.5 (H) 01/25/2019   INSULIN 15.6 08/17/2018   Lab Results  Component Value Date   TSH 5.40 12/26/2021   Lab Results  Component Value Date   CHOL 171 04/09/2022   HDL 62 04/09/2022   LDLCALC 94 04/09/2022   TRIG 82 04/09/2022   CHOLHDL 2.8 04/09/2022   Lab Results  Component Value Date   WBC 6.2 03/27/2022   HGB 11.7 (L)  03/27/2022   HCT 38.2 03/27/2022   MCV 78.3 (L) 03/27/2022   PLT 264 03/27/2022   Lab Results  Component Value Date   IRON 18 (L) 12/03/2021   TIBC 351 12/03/2021   FERRITIN 17 12/03/2021   Attestation Statements:   Reviewed by clinician on day of visit: allergies, medications, problem list, medical history, surgical history, family history, social history, and previous encounter notes.  Time spent on visit including pre-visit chart review and post-visit charting and care was 50 minutes.   I, Davy Pique, am acting as Location manager for Loyal Gambler, DO.  I have reviewed the above documentation for accuracy and completeness, and I agree with the above. - ***

## 2022-04-23 ENCOUNTER — Encounter (INDEPENDENT_AMBULATORY_CARE_PROVIDER_SITE_OTHER): Payer: Self-pay | Admitting: Family Medicine

## 2022-04-23 ENCOUNTER — Ambulatory Visit (INDEPENDENT_AMBULATORY_CARE_PROVIDER_SITE_OTHER): Payer: BC Managed Care – PPO | Admitting: Family Medicine

## 2022-04-23 VITALS — BP 113/82 | HR 63 | Temp 97.9°F | Ht 63.0 in | Wt 237.0 lb

## 2022-04-23 DIAGNOSIS — Z6841 Body Mass Index (BMI) 40.0 and over, adult: Secondary | ICD-10-CM

## 2022-04-23 DIAGNOSIS — I5022 Chronic systolic (congestive) heart failure: Secondary | ICD-10-CM

## 2022-04-23 DIAGNOSIS — E669 Obesity, unspecified: Secondary | ICD-10-CM | POA: Diagnosis not present

## 2022-04-23 DIAGNOSIS — E559 Vitamin D deficiency, unspecified: Secondary | ICD-10-CM

## 2022-04-23 DIAGNOSIS — E88819 Insulin resistance, unspecified: Secondary | ICD-10-CM

## 2022-04-23 MED ORDER — VITAMIN D (ERGOCALCIFEROL) 1.25 MG (50000 UNIT) PO CAPS: 50000.0000 [IU] | ORAL_CAPSULE | ORAL | 0 refills | Status: DC

## 2022-04-25 ENCOUNTER — Ambulatory Visit (HOSPITAL_COMMUNITY): Payer: Self-pay | Admitting: Pharmacist

## 2022-04-25 LAB — POCT INR: INR: 2.1 (ref 2.0–3.0)

## 2022-04-28 DIAGNOSIS — R5381 Other malaise: Secondary | ICD-10-CM | POA: Diagnosis not present

## 2022-05-01 ENCOUNTER — Ambulatory Visit (HOSPITAL_COMMUNITY): Payer: Self-pay | Admitting: Pharmacist

## 2022-05-01 LAB — POCT INR: INR: 2 (ref 2.0–3.0)

## 2022-05-06 ENCOUNTER — Ambulatory Visit (INDEPENDENT_AMBULATORY_CARE_PROVIDER_SITE_OTHER): Payer: Medicare Other

## 2022-05-06 DIAGNOSIS — I428 Other cardiomyopathies: Secondary | ICD-10-CM | POA: Diagnosis not present

## 2022-05-06 LAB — CUP PACEART REMOTE DEVICE CHECK
Battery Remaining Longevity: 33 mo
Battery Voltage: 2.95 V
Brady Statistic RV Percent Paced: 0 %
Date Time Interrogation Session: 20231114113512
HighPow Impedance: 67 Ohm
Implantable Lead Connection Status: 753985
Implantable Lead Implant Date: 20150706
Implantable Lead Location: 753860
Implantable Pulse Generator Implant Date: 20150706
Lead Channel Impedance Value: 342 Ohm
Lead Channel Impedance Value: 437 Ohm
Lead Channel Pacing Threshold Amplitude: 1 V
Lead Channel Pacing Threshold Pulse Width: 0.4 ms
Lead Channel Sensing Intrinsic Amplitude: 5.125 mV
Lead Channel Sensing Intrinsic Amplitude: 5.125 mV
Lead Channel Setting Pacing Amplitude: 2 V
Lead Channel Setting Pacing Pulse Width: 0.4 ms
Lead Channel Setting Sensing Sensitivity: 0.3 mV

## 2022-05-06 NOTE — Progress Notes (Signed)
Chief Complaint:   OBESITY [name] is here to discuss her progress with her obesity treatment plan along with follow-up of her obesity related diagnoses. [name] is on the Category 2 Plan and states she is following her eating plan approximately 90% of the time. [name] states she is walking 30 minutes 4 times per week.  Today's visit was #: 2 Starting weight: 243 lbs Starting date: 04/09/2022 Today's weight: 237 lbs Today's date: 04/23/2022 Total lbs lost to date: 6 lbs Total lbs lost since last in-office visit: 6 lbs  Interim History: Working on meal prep and finding new recipes.  Denies meal skipping, sugar cravings are reduced.  Getting in fruits and veggies.  Husband is supportive.  Added in walking, indoor and outdoor.  Denies DOE with walking.  Has upcoming trip to beach.   Subjective:   1. Vitamin D deficiency Discussed labs with patient today.  Vitamin D level 19.1.   2. Insulin resistance Discussed labs with patient today. Fasting insulin 17.8. She is on chronic prednisone therapy, tapering down.   3. Chronic systolic heart failure ([place]) On LVAD and looking at heart transplant in the future.  Able to do some walking without a problem.   Assessment/Plan:   1. Vitamin D deficiency Low Vitamin D level contributes to fatigue and are associated with obesity, breast, and colon cancer. She agrees to continue to take prescription Vitamin D '@50'$ ,000 IU every week and will follow-up for routine testing of Vitamin D, at least 2-3 times per year to avoid over-replacement.  Begin- Vitamin D, Ergocalciferol, (DRISDOL) 1.25 MG (50000 UNIT) CAPS capsule; Take 1 capsule (50,000 Units total) by mouth every 7 (seven) days.  Dispense: 5 capsule; Refill: 0  2. Insulin resistance Consider use of metformin.  Continue low sugar diet.   3. Chronic systolic heart failure ([place]) Continue plan of care per Cardiology.  Working on BMI reduction for possible cardiac transplant in the future.   4.  Obesity,current BMI 42.1 Continue low Vitamin K veggies with dinner.   [name] is currently in the action stage of change. As such, her goal is to continue with weight loss efforts. She has agreed to the Category 2 Plan.   Exercise goals:  As is.   Behavioral modification strategies: increasing lean protein intake, increasing water intake, increasing high fiber foods, decreasing eating out, no skipping meals, meal planning and cooking strategies, keeping healthy foods in the home, and decreasing junk food.  [name] has agreed to follow-up with our clinic in 3 weeks. She was informed of the importance of frequent follow-up visits to maximize her success with intensive lifestyle modifications for her multiple health conditions.   Objective:   Blood pressure 113/82, pulse 63, temperature 97.9 F (36.6 C), height '5\' 3"'$  (1.6 m), weight 237 lb (107.5 kg), SpO2 100 %. Body mass index is 41.98 kg/m.  General: Cooperative, alert, well developed, in no acute distress. HEENT: Conjunctivae and lids unremarkable. Cardiovascular: Regular rhythm.  Lungs: Normal work of breathing. Neurologic: No focal deficits.   Lab Results  Component Value Date   CREATININE 1.42 (H) 03/27/2022   BUN 26 (H) 03/27/2022   NA 139 03/27/2022   K 3.6 03/27/2022   CL 104 03/27/2022   CO2 25 03/27/2022   Lab Results  Component Value Date   ALT 19 03/27/2022   AST 23 03/27/2022   ALKPHOS 72 03/27/2022   BILITOT 0.5 03/27/2022   Lab Results  Component Value Date   HGBA1C 5.4 04/09/2022  HGBA1C 5.1 08/27/2021   HGBA1C 5.5 04/18/2020   HGBA1C 5.3 12/01/2019   HGBA1C 5.3 07/04/2019   Lab Results  Component Value Date   INSULIN 17.8 04/09/2022   INSULIN 16.6 12/01/2019   INSULIN 19.8 07/04/2019   INSULIN 26.5 (H) 01/25/2019   INSULIN 15.6 08/17/2018   Lab Results  Component Value Date   TSH 5.40 12/26/2021   Lab Results  Component Value Date   CHOL 171 04/09/2022   HDL 62 04/09/2022   LDLCALC 94  04/09/2022   TRIG 82 04/09/2022   CHOLHDL 2.8 04/09/2022   Lab Results  Component Value Date   VD25OH 19.1 (L) 04/09/2022   VD25OH 30.5 12/01/2019   VD25OH 14.4 (L) 07/04/2019   Lab Results  Component Value Date   WBC 6.2 03/27/2022   HGB 11.7 (L) 03/27/2022   HCT 38.2 03/27/2022   MCV 78.3 (L) 03/27/2022   PLT 264 03/27/2022   Lab Results  Component Value Date   IRON 18 (L) 12/03/2021   TIBC 351 12/03/2021   FERRITIN 17 12/03/2021    Attestation Statements:   Reviewed by clinician on day of visit: allergies, medications, problem list, medical history, surgical history, family history, social history, and previous encounter notes.  I, Davy Pique, am acting as Location manager for Loyal Gambler, DO.  I have reviewed the above documentation for accuracy and completeness, and I agree with the above. Dell Ponto, DO

## 2022-05-08 ENCOUNTER — Ambulatory Visit (HOSPITAL_COMMUNITY): Payer: Self-pay | Admitting: Pharmacist

## 2022-05-08 LAB — POCT INR: INR: 1.8 — AB (ref 2.0–3.0)

## 2022-05-12 ENCOUNTER — Ambulatory Visit (INDEPENDENT_AMBULATORY_CARE_PROVIDER_SITE_OTHER): Payer: BC Managed Care – PPO | Admitting: Internal Medicine

## 2022-05-12 ENCOUNTER — Encounter: Payer: Self-pay | Admitting: Internal Medicine

## 2022-05-12 VITALS — Ht 63.0 in | Wt 238.0 lb

## 2022-05-12 DIAGNOSIS — E064 Drug-induced thyroiditis: Secondary | ICD-10-CM

## 2022-05-12 DIAGNOSIS — T462X5A Adverse effect of other antidysrhythmic drugs, initial encounter: Secondary | ICD-10-CM

## 2022-05-12 LAB — TSH: TSH: 4.69 u[IU]/mL (ref 0.35–5.50)

## 2022-05-12 LAB — T4, FREE: Free T4: 0.97 ng/dL (ref 0.60–1.60)

## 2022-05-12 MED ORDER — PREDNISONE 5 MG PO TABS: 5.0000 mg | ORAL_TABLET | Freq: Every day | ORAL | 6 refills | Status: DC

## 2022-05-12 MED ORDER — METHIMAZOLE 10 MG PO TABS: 10.0000 mg | ORAL_TABLET | Freq: Every day | ORAL | 1 refills | Status: DC

## 2022-05-12 NOTE — Progress Notes (Signed)
Name: [name]  MRN/ DOB: [id number], Apr 09, 1971    Age/ Sex: 51 y.o., female    PCP: Patient, No Pcp Per   Reason for Endocrinology Evaluation: Hyperthyroid     Date of Initial Endocrinology Evaluation: 10/08/2021    HPI: [name] is a 51 y.o. female with a past medical history of CHF, nonischemic cardiomyopathy (S/P ICD placement and LVAD), CVA, pulmonary HTN, paroxysmal A-fib. The patient presented for initial endocrinology clinic visit on 4/18/2023for consultative assistance with her Hyperthyroidism.   Patient with extensive cardiac history to include A-fib, and nonischemic cardiomyopathy (s/p insertion of ICD) as well as left MCA territory CVA, s/p thrombectomy and stent placement.   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 08/2021   No FH of thyroid disease No Biotin intake  No prior thyroid /neck sx   She was already on methimazole and prednisone on her initial visit with me which we have adjusted   SUBJECTIVE:    Today (05/12/22): [name] is here for follow-up on hyperthyroidism  She is accompanied her spouse    She is s/p robotic assisted total hysterectomy and BSO for complex adnexal mass on 02/05/2022 She continues to follow-up with cardiology for VAD, had a normal sleep study 01/2022    Weight has been stable  Denies local neck swelling  Has occasional Palpitations  Denies constipation or diarrhea  Denies abdominal pain or vomiting  Denies LE edema    Methimazole 10 mg, 1 tablet twice  daily Prednisone 10 mg, 1 tab daily with breakfast  HISTORY:  Past Medical History:  Past Medical History:  Diagnosis Date   Anemia    Automatic implantable cardioverter-defibrillator in situ    B12 deficiency    Cancer ([place])    Chronic CHF (congestive heart failure) ([place])    a. EF 15-20% b. RHC (09/2013) RA 14, RV 57/22, PA 64/36 (48), PCWP 18, FIck CO/CI 3.7/1.6, PVR 8.1 WU, PA sat 47%    Heart valve problem    History of heart attack    History of hypothyroidism    History of stomach ulcers    HLD (hyperlipidemia)    Hyperthyroidism    Hypotension    Hypothyroidism    Morbid obesity ([place])    Myocardial infarction ([place]) 08/2013   Nocturnal dyspnea    Nonischemic cardiomyopathy ([facility])    Palpitations    Sinus tachycardia    Sleep apnea    Snoring-prob OSA 09/04/2011   SOB (shortness of breath)    Sprint Fidelis ICD lead RECALL  6949    Stroke ([place]) 2021   some residual right-sided weakness   UARS (upper airway resistance syndrome) 09/04/2011   HST 12/2013:  AHI 4/hr (numerous episodes of airflow reduction that did not have concomitant desaturation)    Vitamin D deficiency    Past Surgical History:  Past Surgical History:  Procedure Laterality Date   BREATH TEK H PYLORI N/A 11/09/2014   Procedure: BREATH TEK Kandis Ban;  Surgeon: Greer Pickerel, MD;  Location: [facility];  Service: General;  Laterality: N/A;   CARDIAC CATHETERIZATION  ~ 2006; 09/2013   CARDIAC CATHETERIZATION N/A 05/23/2016   Procedure: Right Heart Cath;  Surgeon: Larey Dresser, MD;  Location: [facility] CV LAB;  Service: Cardiovascular;  Laterality: N/A;   CARDIAC DEFIBRILLATOR PLACEMENT  2006; 12/26/2013   Medtronic [id number]; 12-2013 ICD gen change and RV lead revision with new 6935 RV lead by Dr [name]   CENTRAL LINE INSERTION  08/28/2021   Procedure: CENTRAL LINE INSERTION;  Surgeon: Larey Dresser, MD;  Location: [facility] LAB;  Service: Cardiovascular;;   CESAREAN SECTION  1999   IMPLANTABLE  CARDIOVERTER DEFIBRILLATOR GENERATOR CHANGE N/A 12/26/2013   Procedure: IMPLANTABLE CARDIOVERTER DEFIBRILLATOR GENERATOR CHANGE;  Surgeon: Deboraha Sprang, MD;  Location: [facility] CATH LAB;  Service: Cardiovascular;  Laterality: N/A;   INSERTION OF IMPLANTABLE LEFT VENTRICULAR ASSIST DEVICE N/A 09/03/2021   Procedure: INSERTION OF IMPLANTABLE LEFT VENTRICULAR ASSIST DEVICE/ Heartmate 3;  Surgeon: Dahlia Byes, MD;  Location: [facility];  Service: Open Heart Surgery;  Laterality: N/A;   IR CT HEAD LTD  04/17/2020   IR CT HEAD LTD  04/17/2020   IR INTRA CRAN STENT  04/17/2020   IR PERCUTANEOUS ART THROMBECTOMY/INFUSION INTRACRANIAL INC DIAG ANGIO  04/17/2020   IR RADIOLOGIST EVAL & MGMT  06/05/2020   LEAD REVISION N/A 12/26/2013   Procedure: LEAD REVISION;  Surgeon: Deboraha Sprang, MD;  Location: [facility] CATH LAB;  Service: Cardiovascular;  Laterality: N/A;   PLACEMENT OF IMPELLA LEFT VENTRICULAR ASSIST DEVICE Right 08/29/2021   Procedure: PLACEMENT OF IMPELLA 5.5 LEFT VENTRICULAR ASSIST DEVICE;  Surgeon: Gaye Pollack, MD;  Location: [facility];  Service: Open Heart Surgery;  Laterality: Right;   RADIOLOGY WITH ANESTHESIA N/A 04/17/2020   Procedure: Code Stroke;  Surgeon: Luanne Bras, MD;  Location: [facility];  Service: Radiology;  Laterality: N/A;   REMOVAL OF IMPELLA LEFT VENTRICULAR ASSIST DEVICE N/A 09/03/2021   Procedure: REMOVAL OF IMPELLA LEFT VENTRICULAR ASSIST DEVICE;  Surgeon: Dahlia Byes, MD;  Location: [facility];  Service: Open Heart Surgery;  Laterality: N/A;   RIGHT HEART CATH N/A 08/28/2021   Procedure: RIGHT HEART CATH;  Surgeon: Larey Dresser, MD;  Location: [facility] CV LAB;  Service: Cardiovascular;  Laterality: N/A;   RIGHT HEART CATHETERIZATION N/A 02/15/2014   Procedure: RIGHT HEART CATH;  Surgeon: Larey Dresser, MD;  Location: [facility] CATH LAB;  Service: Cardiovascular;  Laterality: N/A;   ROBOTIC ASSISTED TOTAL HYSTERECTOMY WITH BILATERAL SALPINGO OOPHERECTOMY N/A 02/05/2022   Procedure: XI  ROBOTIC ASSISTED TOTAL HYSTERECTOMY WITH BILATERAL SALPINGO OOPHORECTOMY, CYSTOSCOPY, UTERUS GREATER THAN 250 GRAMS;  Surgeon: Lafonda Mosses, MD;  Location: [facility];  Service: Gynecology;  Laterality: N/A;   TEE WITHOUT CARDIOVERSION N/A 08/29/2021   Procedure: TRANSESOPHAGEAL ECHOCARDIOGRAM (TEE);  Surgeon: Gaye Pollack, MD;  Location: [facility];  Service: Open Heart Surgery;  Laterality: N/A;   TEE WITHOUT CARDIOVERSION N/A 09/03/2021   Procedure: TRANSESOPHAGEAL ECHOCARDIOGRAM (TEE);  Surgeon: Dahlia Byes, MD;  Location: [facility] [place];  Service: Open Heart Surgery;  Laterality: N/A;   TUBAL LIGATION  1999    Social History:  reports that she has never smoked. She has never used smokeless tobacco. She reports that she does not drink alcohol and does not use drugs. Family History: family history includes Heart Problems in her mother; Heart disease in her maternal grandmother and mother; High blood pressure in her father; Hypertension in her father and mother; Obesity in her mother;  Stroke in her father.   HOME MEDICATIONS: Allergies as of 05/12/2022   No Known Allergies      Medication List        Accurate as of May 12, 2022  3:12 PM. If you have any questions, ask your nurse or doctor.          aspirin EC 81 MG tablet Take 1 tablet (81 mg total) by mouth daily. Swallow whole.   methimazole 10 MG tablet Commonly known as: TAPAZOLE Take 1 tablet (10 mg total) by mouth 2 (two) times daily.   potassium chloride SA 20 MEQ tablet Commonly known as: KLOR-CON M Take 3 tablets (60 mEq total) by mouth daily.   predniSONE 10 MG tablet Commonly known as: DELTASONE Take 1 tablet (10 mg total) by mouth daily with breakfast.   quiniDINE gluconate 324 MG CR tablet Take 1 tablet (324 mg total) by mouth 2 (two) times daily.   rosuvastatin 40 MG tablet Commonly known as: CRESTOR TAKE 1 TABLET BY MOUTH ONCE DAILY   sildenafil 20 MG tablet Commonly known as: REVATIO Take 1 tablet (20  mg total) by mouth 3 (three) times daily.   spironolactone 25 MG tablet Commonly known as: ALDACTONE Take 1 tablet (25 mg total) by mouth daily. Restart on 02/10/22.   torsemide 20 MG tablet Commonly known as: DEMADEX Take 2 tablets (40 mg total) by mouth daily.   Vitamin D (Ergocalciferol) 1.25 MG (50000 UNIT) Caps capsule Commonly known as: DRISDOL Take 1 capsule (50,000 Units total) by mouth every 7 (seven) days.   warfarin 2 MG tablet Commonly known as: COUMADIN Take as directed by the anticoagulation clinic. If you are unsure how to take this medication, talk to your nurse or doctor. Original instructions: Take 2 mg every Tuesday and 4 mg all other days or as directed by LV[facility] Clinic.   zinc gluconate 50 MG tablet Take 1 tablet (50 mg total) by mouth daily.          REVIEW OF SYSTEMS: A comprehensive ROS was conducted with the patient and is negative except as per HPI    OBJECTIVE:  VS: Ht '5\' 3"'$  (1.6 m)   Wt 238 lb (108 kg)   BMI 42.16 kg/m    Wt Readings from Last 3 Encounters:  05/12/22 238 lb (108 kg)  04/23/22 237 lb (107.5 kg)  04/09/22 243 lb (110.2 kg)     EXAM: General: Pt appears well and is in NAD  Eyes: External eye exam normal without stare, lid lag or exophthalmos.  EOM intact.    Neck: General: Supple without adenopathy. Thyroid: Thyroid size normal.  No goiter or nodules appreciated.   Lungs: Clear with good BS bilat with no rales, rhonchi, or wheezes  Extremities:  BL LE: No pretibial edema normal ROM and strength.  Mental Status: Judgment, insight: Intact Orientation: Oriented to time, place, and person Mood and affect: No depression, anxiety, or agitation     DATA REVIEWED:  Latest Reference Range & Units 05/12/22 08:34  TSH 0.35 - 5.50 uIU/mL 4.69  T4,Free(Direct) 0.60 - 1.60 ng/dL 0.97      Latest Reference Range & Units 03/27/22 10:10  Sodium 135 - 145 mmol/L 139  Potassium 3.5 - 5.1 mmol/L 3.6  Chloride 98 - 111 mmol/L 104   CO2 22 - 32 mmol/L 25  Glucose 70 - 99 mg/dL 90  BUN 6 - 20 mg/dL 26 (H)  Creatinine 0.44 - 1.00 mg/dL 1.42 (H)  Calcium 8.9 -  10.3 mg/dL 9.7  Anion gap 5 - 15  10  Alkaline Phosphatase 38 - 126 U/L 72  Albumin 3.5 - 5.0 g/dL 4.2  AST 15 - 41 U/L 23  ALT 0 - 44 U/L 19  Total Protein 6.5 - 8.1 g/dL 7.4  Total Bilirubin 0.3 - 1.2 mg/dL 0.5  PREALBUMIN 18 - 38 mg/dL 23  GFR, Estimated >60 mL/min 45 (L)     Latest Reference Range & Units 03/27/22 10:10  WBC 4.0 - 10.5 K/uL 6.2  RBC 3.87 - 5.11 MIL/uL 4.88  Hemoglobin 12.0 - 15.0 g/dL 11.7 (L)  HCT 36.0 - 46.0 % 38.2  MCV 80.0 - 100.0 fL 78.3 (L)  MCH 26.0 - 34.0 pg 24.0 (L)  MCHC 30.0 - 36.0 g/dL 30.6  RDW 11.5 - 15.5 % 15.5  Platelets 150 - 400 K/uL 264  nRBC 0.0 - 0.2 % 0.0      Thyroid ultrasound 08/27/2021  Right thyroid lobe is moderately heterogeneous with at least 2 small nodules, largest measuring 0.5 cm. These nodules do not meet criteria for biopsy or dedicated follow-up.   No definite thyroid tissue in the expected region of the left thyroid lobe. 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   IMPRESSION: 1. 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 2. Thyroid tissue is heterogeneous with small nodules. These nodules do not meet criteria for biopsy or dedicated follow-up.  Old records , labs and images have been reviewed.    ASSESSMENT/PLAN/RECOMMENDATIONS:   Amiodarone-induced thyroid disease:   -The patient was initially diagnosed with hypothyroidism and was on LT-for replacement for decades, prior to developing hyperthyroid and 08/2021 -LT-for replacement was discontinued and she has been on methimazole since 08/26/2021 as well as prednisone -Tft's are normal and will reduce methimazole and prednisone as below     Medications : Decrease methimazole 10 mg, 1 tablet daily  Decrease prednisone 10 mg to 5 mg daily  with  breakfast   Follow-up in 4 months Labs in 8 weeks   Signed electronically by: Mack Guise, MD  [facility]  [facility] [address] [place], [place] [address] Phone: [phone] FAX: [phone]   CC: Patient, No Pcp Per No address on file Phone: None Fax: None   Return to Endocrinology clinic as below: Future Appointments  Date Time Provider [place]  05/27/2022  9:20 AM Valetta Close, Collene Leyden, DO [facility] None  05/27/2022 10:00 AM [facility] None  06/30/2022 11:00 AM Carollee Leitz, MD [facility]  07/07/2022  9:45 AM [facility] LAB [facility] None  09/15/2022  9:30 AM Christepher Melchior, Melanie Crazier, MD [facility] None

## 2022-05-13 ENCOUNTER — Other Ambulatory Visit: Payer: Self-pay | Admitting: Internal Medicine

## 2022-05-13 DIAGNOSIS — I5022 Chronic systolic (congestive) heart failure: Secondary | ICD-10-CM

## 2022-05-13 DIAGNOSIS — Z95811 Presence of heart assist device: Secondary | ICD-10-CM

## 2022-05-13 DIAGNOSIS — I480 Paroxysmal atrial fibrillation: Secondary | ICD-10-CM

## 2022-05-13 LAB — T3: T3, Total: 118 ng/dL (ref 76–181)

## 2022-05-13 NOTE — Telephone Encounter (Signed)
Please advise on refill. Per [name]'s AVS from 3/23: [facility] Cardiologist:  [name], MD   3 month F/U advised at that time. Thank you!

## 2022-05-13 NOTE — Telephone Encounter (Signed)
This is a [place] pt 

## 2022-05-13 NOTE — Telephone Encounter (Signed)
Spoke with Triage nurse. This medication is prescribed by [name] for LVAD and not managed by our office. Thank you!

## 2022-05-14 ENCOUNTER — Ambulatory Visit (HOSPITAL_COMMUNITY): Payer: Self-pay | Admitting: Pharmacist

## 2022-05-14 LAB — POCT INR: INR: 2.4 (ref 2.0–3.0)

## 2022-05-22 ENCOUNTER — Ambulatory Visit (HOSPITAL_COMMUNITY): Payer: Self-pay

## 2022-05-22 LAB — POCT INR: INR: 2.3 (ref 2.0–3.0)

## 2022-05-22 NOTE — Progress Notes (Signed)
LVAD INR 

## 2022-05-26 ENCOUNTER — Other Ambulatory Visit (HOSPITAL_COMMUNITY): Payer: Self-pay | Admitting: Unknown Physician Specialty

## 2022-05-26 DIAGNOSIS — Z7901 Long term (current) use of anticoagulants: Secondary | ICD-10-CM

## 2022-05-26 DIAGNOSIS — Z95811 Presence of heart assist device: Secondary | ICD-10-CM

## 2022-05-27 ENCOUNTER — Encounter (INDEPENDENT_AMBULATORY_CARE_PROVIDER_SITE_OTHER): Payer: Self-pay | Admitting: Family Medicine

## 2022-05-27 ENCOUNTER — Ambulatory Visit (INDEPENDENT_AMBULATORY_CARE_PROVIDER_SITE_OTHER): Payer: Medicare Other | Admitting: Family Medicine

## 2022-05-27 ENCOUNTER — Ambulatory Visit (HOSPITAL_COMMUNITY): Payer: Self-pay | Admitting: Pharmacist

## 2022-05-27 ENCOUNTER — Ambulatory Visit (HOSPITAL_COMMUNITY)
Admission: RE | Admit: 2022-05-27 | Discharge: 2022-05-27 | Disposition: A | Discharge status: Home / Self Care | Payer: Medicare Other | Source: Ambulatory Visit | Attending: Cardiology | Admitting: Cardiology

## 2022-05-27 VITALS — HR 66 | Temp 98.3°F | Ht 63.0 in | Wt 235.0 lb

## 2022-05-27 DIAGNOSIS — Z95811 Presence of heart assist device: Secondary | ICD-10-CM | POA: Diagnosis not present

## 2022-05-27 DIAGNOSIS — E88819 Insulin resistance, unspecified: Secondary | ICD-10-CM | POA: Diagnosis not present

## 2022-05-27 DIAGNOSIS — I5022 Chronic systolic (congestive) heart failure: Secondary | ICD-10-CM

## 2022-05-27 DIAGNOSIS — Z7901 Long term (current) use of anticoagulants: Secondary | ICD-10-CM | POA: Insufficient documentation

## 2022-05-27 DIAGNOSIS — E559 Vitamin D deficiency, unspecified: Secondary | ICD-10-CM | POA: Diagnosis not present

## 2022-05-27 DIAGNOSIS — E669 Obesity, unspecified: Secondary | ICD-10-CM | POA: Diagnosis not present

## 2022-05-27 DIAGNOSIS — Z6841 Body Mass Index (BMI) 40.0 and over, adult: Secondary | ICD-10-CM | POA: Diagnosis not present

## 2022-05-27 DIAGNOSIS — Z7952 Long term (current) use of systemic steroids: Secondary | ICD-10-CM

## 2022-05-27 LAB — BASIC METABOLIC PANEL
Anion gap: 7 (ref 5–15)
BUN: 29 mg/dL — ABNORMAL HIGH (ref 6–20)
CO2: 25 mmol/L (ref 22–32)
Calcium: 9.8 mg/dL (ref 8.9–10.3)
Chloride: 107 mmol/L (ref 98–111)
Creatinine, Ser: 1.32 mg/dL — ABNORMAL HIGH (ref 0.44–1.00)
GFR, Estimated: 49 mL/min — ABNORMAL LOW (ref >60)
Glucose, Bld: 89 mg/dL (ref 70–99)
Potassium: 4.2 mmol/L (ref 3.5–5.1)
Sodium: 139 mmol/L (ref 135–145)

## 2022-05-27 LAB — CBC
HCT: 41.9 % (ref 36.0–46.0)
Hemoglobin: 13.3 g/dL (ref 12.0–15.0)
MCH: 24.8 pg — ABNORMAL LOW (ref 26.0–34.0)
MCHC: 31.7 g/dL (ref 30.0–36.0)
MCV: 78 fL — ABNORMAL LOW (ref 80.0–100.0)
Platelets: 255 10*3/uL (ref 150–400)
RBC: 5.37 MIL/uL — ABNORMAL HIGH (ref 3.87–5.11)
RDW: 18.4 % — ABNORMAL HIGH (ref 11.5–15.5)
WBC: 5.9 10*3/uL (ref 4.0–10.5)
nRBC: 0 % (ref 0.0–0.2)

## 2022-05-27 LAB — PROTIME-INR
INR: 1.8 — ABNORMAL HIGH (ref 0.8–1.2)
Prothrombin Time: 20.5 seconds — ABNORMAL HIGH (ref 11.4–15.2)

## 2022-05-27 LAB — LACTATE DEHYDROGENASE: LDH: 186 U/L (ref 98–192)

## 2022-05-27 MED ORDER — DAPAGLIFLOZIN PROPANEDIOL 10 MG PO TABS: 10.0000 mg | ORAL_TABLET | Freq: Every day | ORAL | 6 refills | Status: DC

## 2022-05-27 MED ORDER — TORSEMIDE 20 MG PO TABS: 20.0000 mg | ORAL_TABLET | Freq: Every day | ORAL | 3 refills | Status: DC

## 2022-05-27 NOTE — Patient Instructions (Signed)
Decrease Torsemide to 1 tablet daily (20 mg) Start Farxiga 10 mg daily Coumadin dosing per Ander Purpura PharmD Return to clinic in 2 months

## 2022-05-27 NOTE — Progress Notes (Addendum)
Patient presents for 2 month f/u in [facility] today alone. Reports no problems with VAD equipment or concerns with drive line.  Pt states that she is feeling great since last visit. Denies lightheadedness, dizziness, falls, shortness of breath, and signs of bleeding. Has not had any issues since her hysterectomy with the exception of extreme hot flashes. Pt is not a candidate for hormone replacement due to her stroke history.  Pt states that about a month ago she decreased her Torsemide to every other day as she was feeling dizzy on and off and felt better when she increased her fluid intake.   Pt had f/u with Dr Berline Lopes 03/13/22. Per her note:   " Patient was given a copy of her pathology report. 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  We will plan to get an inhibin B level today to establish baseline after surgery.  Given small tumor limited to the ovary, is low risk disease.  We will plan to get a CT abdomen and pelvis.  Discussed surveillance with the patient which would include visits every 6 to 12 months initially with exam and tumor marker."  Pt has seen Endo who has decreased her Methimazole and Prednisone.  Pt is participating in healthy weight and wellness program and has lost a total of 8 lbs.  Vital Signs:  Doppler Pressure: 100  Automatc BP: 96/58 (70) HR: 79 SR SPO2: 98% on RA   Weight: 235.4 lbs w/o eqt Last weight: 237 lb w/o eqt Home weights:  lbs  VAD Indication: Destination Therapy d/t BMI   VAD interrogation & Equipment Management: Speed: 5900 Flow: 5.3 Power: 4.9 w    PI: 4.2   Alarms: none Events: 50-60 daily  Fixed speed 5900 Low speed limit: 5600   Primary Controller:  Replace back up battery in 22 months. Back up controller:   Replace back up battery in 28 months.    Annual Equipment Maintenance on UBC/PM was performed on 09/03/21.    I reviewed the LVAD parameters from today and compared  the results to the patient's prior recorded data. LVAD interrogation was NEGATIVE for significant power changes, NEGATIVE for clinical alarms and STABLE for PI events/speed drops. No programming changes were made and pump is functioning within specified parameters. Pt is performing daily controller and system monitor self tests along with completing weekly and monthly maintenance for LVAD equipment.   LVAD equipment check completed and is in good working order. Back-up equipment present. Charged back up battery and performed self-test on equipment.    Exit Site Care: Drive line is being maintained weekly by her husband [name]. Existing VAD dressing removed and site care performed using sterile technique. Drive line exit site cleaned with SALINE ONLY, allowed to dry, and gauze dressing with silk tape applied. Exit site healed and incorporated, the velour is fully implanted at exit site. No redness, tenderness, foul odor or rash noted. There is an area of irration that is healing. Scant amount of brown drainage. Drive line anchor re-applied. Continue weekly dressing changes using daily kit. STOP using CHG. Only use silk tape and special anchor. Provided with 14 daily kits, 14 special anchors, roll of silk tape and 2 boxes of large tegaderms for home use.       Significant Events on VAD Support:    Device: Medtronic Therapies: on VF 240 VT 200 VVI: 40 Last check: 11/26/21   BP & Labs:  Doppler 100 -  reflecting Modified systolic   Hgb 74.0 - No S/S of bleeding. Specifically denies vaginal bleeding, melena/BRBPR or nosebleeds.    LDH stable at 186 with established baseline of 250 - 450. Denies tea-colored urine. No power elevations noted on interrogation.    Plan:  Decrease Torsemide to 1 tablet daily (20 mg) Start Farxiga 10 mg daily STOP using CHG for driveline care. Use saline only, with daily kit, silk tape and special anchor. Coumadin dosing per Ander Purpura PharmD Return to clinic in 2  months  Tanda Rockers RN [place] Coordinator  Office: [phone]  24/7 Pager: [phone]   ID:  JON KASPAREK, DOB 10/12/1970, MRN [id number]    Provider location: [facility] Type of Visit: Established patient    PCP:  [name], MD    Cardiologist:  [name], MD  History of Present Illness: [name] is a 51 y.o. 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  She was followed for systolic CHF initially in [place]. Apparently her EF recovered to 35-40%. However, she was admitted to [facility] 09/19/13 for worsening dyspnea and CP. Coronaries were reportedly OK on LHC at that time at [facility]. Echo showed EF of 15-20% with four-chamber enlargement with moderate-severe TR/MR. RHC as below. Rheumatological w/u negative.  CPX in 9/16 showed near-normal functional capacity.  Repeat echo in 6/17 shows that EF remains 20%. Repeat CPX in 1/18 was submaximal but probably only mild circulatory limitation.  Echo 8/18 showed EF 25-30%, severe LV dilation.  CPX in 6/19 showed low normal functional capacity.  Echo in 10/19 showed EF 20-25%, moderate LV dilation.    Zio patch in 2/20 with 11% PVCs and NSVT, amiodarone started. Zio patch in 3/20 with 13.3% PVCs and NSVT.  She saw Dr. [name], he thought that increased PVCs were electromechanical in nature due to worsening heart failure.  Repeat Zio patch on amiodarone in 1/21 showed 6.2% PVCs.   She was admitted in 4/20 with VT in setting of hypokalemia.    CPX in 9/20 showed mild functional limitation, more due to body habitus than heart failure.  Echo in 4/21 showed EF 20% with severe LV dilation, diffuse hypokinesis, normal RV size and systolic function.   In 8/21, she had a shock for VF in the setting of hypokalemia.   Patient was admitted in 10/21 with CVA involving left MCA territory.  This was thought to be cardioembolic  but LV thrombus was not visualized and no atrial fibrillation has been seen.  Echo in 10/21 showed EF 20-25%, severe LV dilation (no LV thrombus seen), mildly decreased RV systolic function, mild-moderate MR. She was taken by IR for thrombectomy and stent placement, now on ASA 81 and ticagrelor.   She had recurrent VT in 2/22, quinidine was added by EP.  Spironolactone and Coreg were decreased due to low BP and lightheadedness.    Echo in 4/22 showed EF 25% with severe LV dilation and global hypokinesis, normal RV, mild-moderate MR.   CPX (8/22) showed peak VO2 14.3, VE/VCO2 slope 42, RER 1.09 => mild to moderate HF limitation, elevated VE/VCO2 slope is likely false from end exercise hyperventilation. Restrictive lung physiology from obesity.   Patient was admitted in 3/23 with recurrent CHF exacerbation with low cardiac output.  She was also noted to be hyperthyroidism but amiodarone had to be continued due to VT and poorly tolerated atrial fibrillation.  She required Impella 5.5 placement and eventual HM3 LVAD placement on 09/04/21.  Post-op course complicated by atrial fibrillation, malnutrition, and deconditioning.  She went to CIR prior to discharge.   Patient developed heavy vaginal bleeding in 7/23, she was seen in the ER and required 2 units PRBCs.  She was admitted in 8/23 and had hysterectomy + BSO, LVAD speed increased to 5900 rpm on ramp echo.  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    She returns for followup of CHF and LVAD.  She has been doing well overall.  She has been losing weight, down 8 lbs recently.  She felt like she was getting overdiuresed recently so decreased her torsemide to 40 mg every other day.  MAP 70 today.  No lightheadedness.  No exertional dyspnea with usual activities.  She and her husband have gone on several trips.  She is working out every day.  She can climb a flight of stairs with no problems.   LVAD interrogation: Please  see LVAD nursing note above.   Labs: (11/09/13): Dig level 0.5, K 3.6, Creatinine 0.83, pro-BNP 622 (12/19/13): K 3.6, creatinine 0.8 (7/15): K 4.1, creatinine 0.91 (8/15): K 3.9, creatinine 0.91 (10/15): K 4, creatinine 0.93, proBNP 129 (12/15): K 4, creatinine 0.9, digoxin 0.4 (5/16): K 4, creatinine 0.86 (9/16): K 3.6, creatinine 0.86, digoxin < 0.2 (5/17): K 4.1, creatinine 0.95, digoxin 0.3 (5/18): K 3.4, creatinine 0.95, TSH elevated but free T3 and free T4 normal.  (8/18): K 3.9, creatinine 0.87, BNP 108  (11/18): K 3.5, creatinine 1.04, digoxin 0.4 (1/19): LDL 91, HDL 47 (3/19): digoxin 0.7, K 4, creatinine 0.99 (4/19): TSH normal, LDL 95 (9/19): LDL 99, HDL 45, K 4, creatinine 0.96 (10/19): K 3.6, creatinine 0.85, digoxin 0.5 (2/20): LDL 103 (3/20): K 3.9, creatinine 1.05 (4/20): K 3.9, creatinine 1.23 => 1.15, TSH 8, digoxin 0.3, LFTs normal 6/20): K 4.3, creatinine 1.07, LFTs normal, digoxin 0.5 (7/20): K 3.8, creatinine 1.16 (8/20): digoxin < 0.2, LDL 135 (1/21): K 3.9, creatinine 1.05, LDL 171  (4/21): K 4.2, creatinine 0.95, LDL 127, TSH normal, LFTs normal (10/21): K 2.8, creatinine 1.21, LDL 130 (11/21): K 4.5, creatinine 1.3 => 1.2, LFTs normal, digoxin 1.3, TSH normal (3/22): K 4.1, creatinine 1.36 (4/22): hgb 10.8 (7/22): Transferrin 8%, digoxin 0.9, hgb 10.1, K 3.9, creatinine 1.42 (8/22): digoxin 0.4 (9/22): K 4.2, creatinine 1.32 (4/23): hgb 11, WBCs 13, K 3.9 => 3.8, creatinine 0.9 => 0.98, albumin 3.2, ALT 45, AST 31, LDH 272 => 244, TSH 0.235, free T4 1.84 (7/23): hgb 8, TSH normal 8/23): K 3.8, creatinine 1.15, hgb 11.4 (9/23): K 3.7, creatinine 1.36 (10/23): K 3.6, creatinine 1.42 (11/23): TSH normal  PMH: 1. OSA: Using CPAP.  2. PVCs:  - Zio patch 2/20: 11% PVCs - Zio patch 3/20: 13.3% PVCs - Zio patch 1/21: 6.2% PVCs 3. VT/VF: 8/21, in setting of hypokalemia.  2/22 VT, quinidine added.  4. 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  No LV thrombus  visualized and atrial fibrillation has not been visualized.  - IR thrombectomy with stent placement in 10/21.  5. Chronic systolic CHF: Nonischemic cardiomyopathy:   - Coronary angiography 2015 at [facility]: Normal coronaries.  - Echo (8/15) with EF 20%, moderate LV dilation, diffuse hypokinesis, normal RV size with mildly decreased systolic function, mild MR.  - Echo (5/16) with EF 20%, spherical LV with diffuse hypokinesis, mild MR.  - Echo (6/17) with EF 20%,  diffuse hypokinesis, moderate LV dilation, normal RV, mild to moderate MR.  - RHC (12/17): mean RA 5, PA 56/31, mean PCWP 24, CI 2, PVR 3.7 WU - Echo (8/18) with EF 25-30%, severe LV dilation, severe LAE.  - Echo (2/20) with EF 20-25%, moderate LV dilation, moderate MR.  - Echo (4/21) with EF 20-25%, severe LV dilation, mild-moderate MR, normal RV, PASP 26 mmHg.  - Echo (10/21) with EF 20-25%, severe LV dilation (no LV thrombus seen), mildly decreased RV systolic function, mild-moderate MR. - CPX in 9/20 showed mild functional limitation, more due to body habitus than heart failure.  - CPX in 8/21: Peak VO2 11.6, VE/VCO2 32, RER 1.2.  Mild HF limitation.  - Echo (4/22): EF 25% with severe LV dilation and global hypokinesis, normal RV, mild-moderate MR.  - CPX (8/22): peak VO2 14.3, VE/VCO2 slope 42, RER 1.09 => mild to moderate HF limitation, elevated VE/VCO2 slope is likely false from end exercise hyperventilation. Restrictive lung physiology from obesity.  - HM3 LVAD placement 09/04/21.  6. PFTs (1/22): Near normal.  7. Fe deficiency anemia 8. 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  10. Left ovarian mass -  hysterectomy + BSO.  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      Current Outpatient Medications  Medication Sig Dispense Refill   aspirin EC 81 MG tablet Take 1 tablet (81 mg total) by mouth daily. Swallow whole. 90 tablet 3   dapagliflozin  propanediol (FARXIGA) 10 MG TABS tablet Take 1 tablet (10 mg total) by mouth daily before breakfast. 30 tablet 6   methimazole (TAPAZOLE) 10 MG tablet Take 1 tablet (10 mg total) by mouth daily. 90 tablet 1   potassium chloride SA (KLOR-CON M) 20 MEQ tablet Take 3 tablets (60 mEq total) by mouth daily. 270 tablet 3   predniSONE (DELTASONE) 5 MG tablet Take 1 tablet (5 mg total) by mouth daily with breakfast. 30 tablet 6   quiniDINE gluconate 324 MG CR tablet TAKE 1 TABLET BY MOUTH TWICE A DAY 60 tablet 6   rosuvastatin (CRESTOR) 40 MG tablet TAKE 1 TABLET BY MOUTH ONCE DAILY 30 tablet 2   sildenafil (REVATIO) 20 MG tablet Take 1 tablet (20 mg total) by mouth 3 (three) times daily. 90 tablet 6   spironolactone (ALDACTONE) 25 MG tablet Take 1 tablet (25 mg total) by mouth daily. Restart on 02/10/22. 45 tablet 3   Vitamin D, Ergocalciferol, (DRISDOL) 1.25 MG (50000 UNIT) CAPS capsule Take 1 capsule (50,000 Units total) by mouth every 7 (seven) days. 5 capsule 0   warfarin (COUMADIN) 2 MG tablet Take 2 mg every Tuesday and 4 mg all other days or as directed by LVAD Clinic. 120 tablet 5   torsemide (DEMADEX) 20 MG tablet Take 1 tablet (20 mg total) by mouth daily. 180 tablet 3   No current facility-administered medications for this encounter.    Allergies:   Patient has no known allergies.   Social History:  The patient  reports that she has never smoked. She has never used smokeless tobacco. She reports that she does not drink alcohol and does not use drugs.   Family History:  The patient's family history includes Heart Problems in her mother; Heart disease in her maternal grandmother and mother; High blood pressure in her father; Hypertension in her father and mother; Obesity in her mother; Stroke in her father.   ROS:  Please see the history  of present illness.   All other systems are personally reviewed and negative.   Exam:   BP (!) 96/58 Comment: 70  Pulse 79   Ht _0  (1.6 m)   Wt 106.8  kg (235 lb 6.4 oz)   SpO2 98%   BMI 41.70 kg/m  MAP 70 General: Well appearing this am. NAD.  HEENT: Normal. Neck: Supple, JVP 7-8 cm. Carotids OK.  Cardiac:  Mechanical heart sounds with LVAD hum present.  Lungs:  CTAB, normal effort.  Abdomen:  NT, ND, no HSM. No bruits or masses. +BS  LVAD exit site: Well-healed and incorporated. Dressing dry and intact. No erythema or drainage. Stabilization device present and accurately applied. Driveline dressing changed daily per sterile technique. Extremities:  Warm and dry. No cyanosis, clubbing, rash, or edema.  Neuro:  Alert & oriented x 3. Cranial nerves grossly intact. Moves all 4 extremities w/o difficulty. Affect pleasant     Recent Labs: 09/17/2021: B Natriuretic Peptide 279.4 02/04/2022: Magnesium 1.9 03/27/2022: ALT 19 05/12/2022: TSH 4.69 05/27/2022: BUN 29; Creatinine, Ser 1.32; Hemoglobin 13.3; Platelets 255; Potassium 4.2; Sodium 139  Personally reviewed   Wt Readings from Last 3 Encounters:  05/27/22 106.8 kg (235 lb 6.4 oz)  05/27/22 106.6 kg (235 lb)  05/12/22 108 kg (238 lb)    ASSESSMENT AND PLAN:   1. Chronic systolic HF: Nonischemic cardiomyopathy.  She has a Medtronic ICD. Cause for cardiomyopathy is uncertain, initially identified peri-partum (after son's birth), so cannot rule out peri-partum CMP.  On and off, she has had frequent PVCs.   RHC in 8/15 showed normal filling pressures and preserved cardiac output. Echo in 4/22 with EF 25%, severe LV dilation.  CPX (8/22) with mild-moderate HF limitation.  Echo in 3/23 with EF < 20% with severe dilation, normal RV size with mildly decreased systolic function, moderate-severe functional MR with dilated IVC. 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 Impella 5.5 placed 08/29/21.  Patient had HM3 LVAD placed 09/04/21 and speed adjusted by ramp  echo up to 5800 rpm and again up to 5900 rpm. LVAD interrogation shows stable parameters today.  Weight is down 8 lbs.  She is not volume overloaded, NYHA class II symptoms.   - Check torsemide to 20 mg daily and start Farxiga 10 mg daily.  BMET today and in 10 days.  - Continue spironolactone 25 mg daily.   - She should continue ASA 81 mg daily with history of CVA.  - Continue sildenafil 20 mg tid  - Warfarin for VAD, goal INR 2-2.5.   2. 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  She had been on Levoxyl at home prior to 3/23 admission, noted to be hyperthyroid in 3/23.  This likely contributes to her CHF. She had recurrent VT and poorly tolerated AF which made it difficult for [name] to stop amiodarone. Initially treated w/ IV Solumedrol  -> transitioned to prednisone and methimazole.  Endocrinology following, prednisone being titrated down.  She is off amiodarone. Last TSH was normal.  - Management per endocrinology 3. PVCs/VT: EP consulted given complicated situation with hyperthyroidism and VT.  - Continue quinidine.  - Now off amiodarone.  4. CVA: CVA in 10/21, had thrombectomy with stent placement.  No LV thrombus noted and no atrial fibrillation noted.  - Would continue ASA 81 daily.   5. Anemia: Stable  hgb recently.  - CBC today.  6. CKD stage 3: Creatinine recently improved.   - BMET today.  - Add Farxiga for renal protection as above.  7. Mitral regurgitation: Moderate-severe functional MR pre-LVAD.  Unlikely to be Mitraclip candidate with severely dilated LV (>7 cm). Mitral regurgitation was mild on 8/23 echo.  8. Left ovarian mass: Hysterectomy + BSO.  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   9. Atrial fibrillation: Tolerates poorly.   - Watch closely off amiodarone.   Followup in 2 months.   [name], MD  05/27/2022  [facility] [address] [facility] and  [place] [address] [phone] (office) [phone] (fax)

## 2022-06-02 NOTE — Progress Notes (Signed)
Remote ICD transmission.   

## 2022-06-04 NOTE — Progress Notes (Signed)
Chief Complaint:   OBESITY [name] is here to discuss her progress with her obesity treatment plan along with follow-up of her obesity related diagnoses. [name] is on the Category 2 Plan and states she is following her eating plan approximately 75-80% of the time. [name] states she is walking 30 minutes 3 times per week.  Today's visit was #: 3 Starting weight: 19 LBS Starting date: 04/09/2022 Today's weight: 235 LBS Today's date: 05/27/2022 Total lbs lost to date: 8 LBS Total lbs lost since last in-office visit: 2 LBS  Interim History: Occasionally making fried chicken at home.  She has increased protein 2 servings, she has added cheese, and [place] yogurt.  She denies hunger or cravings, energy level and bowels have improved.  She drinks water, no sugar or juice.  She has a support system at home. She is doing well with meal prepping.  She has traveled to [employer] and mountains.  Subjective:   1. Vitamin D deficiency Patient is on prescription vitamin D 50,000 IUs every week, but insurance is not covering.  Her last vitamin D level was 19.1 on 04/09/2022.  2. Insulin resistance Fasting insulin is elevated on 1018, level is 17.8.  She is actively working on changes to diet and exercise.  Her visceral fat rating is down 2  points to 6 weeks.  3. Current chronic use of systemic steroids She has slowly reduce steroid use per endocrinology.  This should also have a weight reduction, eventually tapering off prednisone.  Assessment/Plan:   1. Vitamin D deficiency Changed to OTC vitamin D 5000 IU daily.  Recheck level in February.  2. Insulin resistance Continue prescription meal plan.   increase walking to 30 minutes 3-4 times per week.  3. Current chronic use of systemic steroids Continue plan of care per endocrinology  4. Obesity,current BMI 41.7 Weight loss since last visit 100%, body fat loss has decreased by 2 lb  [name] is currently in the action stage of change. As such,  her goal is to continue with weight loss efforts. She has agreed to the Category 2 Plan.   Exercise goals:  As is.  Behavioral modification strategies: increasing lean protein intake, increasing vegetables, decreasing eating out, no skipping meals, meal planning and cooking strategies, keeping healthy foods in the home, holiday eating strategies , and planning for success.  [name] has agreed to follow-up with our clinic in 4 weeks. She was informed of the importance of frequent follow-up visits to maximize her success with intensive lifestyle modifications for her multiple health conditions.   Objective:   Pulse 66, temperature 98.3 F (36.8 C), height '5\' 3"'$  (1.6 m), weight 235 lb (106.6 kg), SpO2 100 %. Body mass index is 41.63 kg/m.  General: Cooperative, alert, well developed, in no acute distress. HEENT: Conjunctivae and lids unremarkable. Cardiovascular: Regular rhythm.  Lungs: Normal work of breathing. Neurologic: No focal deficits.   Lab Results  Component Value Date   CREATININE 1.32 (H) 05/27/2022   BUN 29 (H) 05/27/2022   NA 139 05/27/2022   K 4.2 05/27/2022   CL 107 05/27/2022   CO2 25 05/27/2022   Lab Results  Component Value Date   ALT 19 03/27/2022   AST 23 03/27/2022   ALKPHOS 72 03/27/2022   BILITOT 0.5 03/27/2022   Lab Results  Component Value Date   HGBA1C 5.4 04/09/2022   HGBA1C 5.1 08/27/2021   HGBA1C 5.5 04/18/2020   HGBA1C 5.3 12/01/2019   HGBA1C 5.3 07/04/2019  Lab Results  Component Value Date   INSULIN 17.8 04/09/2022   INSULIN 16.6 12/01/2019   INSULIN 19.8 07/04/2019   INSULIN 26.5 (H) 01/25/2019   INSULIN 15.6 08/17/2018   Lab Results  Component Value Date   TSH 4.69 05/12/2022   Lab Results  Component Value Date   CHOL 171 04/09/2022   HDL 62 04/09/2022   LDLCALC 94 04/09/2022   TRIG 82 04/09/2022   CHOLHDL 2.8 04/09/2022   Lab Results  Component Value Date   VD25OH 19.1 (L) 04/09/2022   VD25OH 30.5 12/01/2019   VD25OH  14.4 (L) 07/04/2019   Lab Results  Component Value Date   WBC 5.9 05/27/2022   HGB 13.3 05/27/2022   HCT 41.9 05/27/2022   MCV 78.0 (L) 05/27/2022   PLT 255 05/27/2022   Lab Results  Component Value Date   IRON 18 (L) 12/03/2021   TIBC 351 12/03/2021   FERRITIN 17 12/03/2021    Attestation Statements:   Reviewed by clinician on day of visit: allergies, medications, problem list, medical history, surgical history, family history, social history, and previous encounter notes.  I, Davy Pique, am acting as Location manager for Loyal Gambler, DO.  I have reviewed the above documentation for accuracy and completeness, and I agree with the above. Dell Ponto, DO

## 2022-06-05 ENCOUNTER — Ambulatory Visit (HOSPITAL_COMMUNITY): Payer: Self-pay | Admitting: Pharmacist

## 2022-06-05 LAB — POCT INR: INR: 1.7 — AB (ref 2.0–3.0)

## 2022-06-05 MED ORDER — WARFARIN SODIUM 2 MG PO TABS: ORAL_TABLET | ORAL | 5 refills | Status: DC

## 2022-06-12 ENCOUNTER — Ambulatory Visit (HOSPITAL_COMMUNITY): Payer: Self-pay | Admitting: Pharmacist

## 2022-06-12 LAB — POCT INR: INR: 2.4 (ref 2.0–3.0)

## 2022-06-19 ENCOUNTER — Ambulatory Visit (HOSPITAL_COMMUNITY): Payer: Self-pay | Admitting: Pharmacist

## 2022-06-19 LAB — POCT INR: INR: 2.2 (ref 2.0–3.0)

## 2022-06-22 ENCOUNTER — Observation Stay (HOSPITAL_COMMUNITY)
Admission: EM | Admit: 2022-06-22 | Discharge: 2022-06-23 | Disposition: A | Discharge status: Home / Self Care | Payer: Medicare Other | Attending: Student | Admitting: Student

## 2022-06-22 ENCOUNTER — Encounter (HOSPITAL_COMMUNITY): Payer: Self-pay

## 2022-06-22 DIAGNOSIS — Z95810 Presence of automatic (implantable) cardiac defibrillator: Secondary | ICD-10-CM | POA: Diagnosis not present

## 2022-06-22 DIAGNOSIS — N183 Chronic kidney disease, stage 3 unspecified: Secondary | ICD-10-CM | POA: Insufficient documentation

## 2022-06-22 DIAGNOSIS — I051 Rheumatic mitral insufficiency: Secondary | ICD-10-CM | POA: Diagnosis not present

## 2022-06-22 DIAGNOSIS — I5022 Chronic systolic (congestive) heart failure: Secondary | ICD-10-CM | POA: Insufficient documentation

## 2022-06-22 DIAGNOSIS — R040 Epistaxis: Secondary | ICD-10-CM | POA: Diagnosis not present

## 2022-06-22 DIAGNOSIS — E039 Hypothyroidism, unspecified: Secondary | ICD-10-CM | POA: Diagnosis not present

## 2022-06-22 DIAGNOSIS — I428 Other cardiomyopathies: Secondary | ICD-10-CM

## 2022-06-22 DIAGNOSIS — Z7901 Long term (current) use of anticoagulants: Secondary | ICD-10-CM | POA: Insufficient documentation

## 2022-06-22 DIAGNOSIS — N83202 Unspecified ovarian cyst, left side: Secondary | ICD-10-CM | POA: Insufficient documentation

## 2022-06-22 DIAGNOSIS — Z95811 Presence of heart assist device: Secondary | ICD-10-CM | POA: Diagnosis not present

## 2022-06-22 DIAGNOSIS — Z7982 Long term (current) use of aspirin: Secondary | ICD-10-CM | POA: Diagnosis not present

## 2022-06-22 DIAGNOSIS — Z8673 Personal history of transient ischemic attack (TIA), and cerebral infarction without residual deficits: Secondary | ICD-10-CM | POA: Insufficient documentation

## 2022-06-22 DIAGNOSIS — I4891 Unspecified atrial fibrillation: Secondary | ICD-10-CM | POA: Insufficient documentation

## 2022-06-22 DIAGNOSIS — Z79899 Other long term (current) drug therapy: Secondary | ICD-10-CM | POA: Diagnosis not present

## 2022-06-22 LAB — BASIC METABOLIC PANEL
Anion gap: 6 (ref 5–15)
BUN: 40 mg/dL — ABNORMAL HIGH (ref 6–20)
CO2: 22 mmol/L (ref 22–32)
Calcium: 9.2 mg/dL (ref 8.9–10.3)
Chloride: 109 mmol/L (ref 98–111)
Creatinine, Ser: 1.05 mg/dL — ABNORMAL HIGH (ref 0.44–1.00)
GFR, Estimated: >60 mL/min (ref >60)
Glucose, Bld: 110 mg/dL — ABNORMAL HIGH (ref 70–99)
Potassium: 4.6 mmol/L (ref 3.5–5.1)
Sodium: 137 mmol/L (ref 135–145)

## 2022-06-22 LAB — CBC
HCT: 38.5 % (ref 36.0–46.0)
HCT: 35.1 % — ABNORMAL LOW (ref 36.0–46.0)
Hemoglobin: 12 g/dL (ref 12.0–15.0)
Hemoglobin: 11.2 g/dL — ABNORMAL LOW (ref 12.0–15.0)
MCH: 24.6 pg — ABNORMAL LOW (ref 26.0–34.0)
MCH: 24.9 pg — ABNORMAL LOW (ref 26.0–34.0)
MCHC: 31.2 g/dL (ref 30.0–36.0)
MCHC: 31.9 g/dL (ref 30.0–36.0)
MCV: 78.9 fL — ABNORMAL LOW (ref 80.0–100.0)
MCV: 78 fL — ABNORMAL LOW (ref 80.0–100.0)
Platelets: 292 10*3/uL (ref 150–400)
Platelets: 269 10*3/uL (ref 150–400)
RBC: 4.88 MIL/uL (ref 3.87–5.11)
RBC: 4.5 MIL/uL (ref 3.87–5.11)
RDW: 17.2 % — ABNORMAL HIGH (ref 11.5–15.5)
RDW: 17.3 % — ABNORMAL HIGH (ref 11.5–15.5)
WBC: 11.8 10*3/uL — ABNORMAL HIGH (ref 4.0–10.5)
WBC: 11.7 10*3/uL — ABNORMAL HIGH (ref 4.0–10.5)
nRBC: 0 % (ref 0.0–0.2)
nRBC: 0 % (ref 0.0–0.2)

## 2022-06-22 LAB — LACTATE DEHYDROGENASE: LDH: 189 U/L (ref 98–192)

## 2022-06-22 LAB — PROTIME-INR
INR: 2.9 — ABNORMAL HIGH (ref 0.8–1.2)
Prothrombin Time: 29.8 seconds — ABNORMAL HIGH (ref 11.4–15.2)

## 2022-06-22 MED ORDER — QUINIDINE GLUCONATE ER 324 MG PO TBCR
324.0000 mg | EXTENDED_RELEASE_TABLET | Freq: Two times a day (BID) | ORAL | Status: DC
  Filled 2022-06-22 (×3): qty 1

## 2022-06-22 MED ORDER — LORAZEPAM 2 MG/ML IJ SOLN
0.5000 mg | Freq: Once | INTRAMUSCULAR | Status: AC
  Administered 2022-06-22: 0.5 mg via INTRAVENOUS
  Filled 2022-06-22: qty 1

## 2022-06-22 MED ORDER — SPIRONOLACTONE 25 MG PO TABS
25.0000 mg | ORAL_TABLET | Freq: Every day | ORAL | Status: DC
  Administered 2022-06-22 – 2022-06-23 (×2): 25 mg via ORAL
  Filled 2022-06-22 (×2): qty 1

## 2022-06-22 MED ORDER — PREDNISONE 5 MG PO TABS
5.0000 mg | ORAL_TABLET | Freq: Every day | ORAL | Status: DC
  Administered 2022-06-22 – 2022-06-23 (×2): 5 mg via ORAL
  Filled 2022-06-22 (×2): qty 1

## 2022-06-22 MED ORDER — POTASSIUM CHLORIDE CRYS ER 20 MEQ PO TBCR
60.0000 meq | EXTENDED_RELEASE_TABLET | Freq: Every day | ORAL | Status: DC
  Administered 2022-06-22 – 2022-06-23 (×2): 60 meq via ORAL
  Filled 2022-06-22 (×2): qty 3

## 2022-06-22 MED ORDER — CEPHALEXIN 500 MG PO CAPS
500.0000 mg | ORAL_CAPSULE | Freq: Three times a day (TID) | ORAL | Status: DC
  Administered 2022-06-22 – 2022-06-23 (×4): 500 mg via ORAL
  Filled 2022-06-22 (×2): qty 1
  Filled 2022-06-22: qty 2
  Filled 2022-06-22: qty 1

## 2022-06-22 MED ORDER — CEPHALEXIN 500 MG PO CAPS: 500.0000 mg | ORAL_CAPSULE | Freq: Three times a day (TID) | ORAL | 0 refills | Status: DC

## 2022-06-22 MED ORDER — TORSEMIDE 20 MG PO TABS
20.0000 mg | ORAL_TABLET | Freq: Every day | ORAL | Status: DC
  Administered 2022-06-22 – 2022-06-23 (×2): 20 mg via ORAL
  Filled 2022-06-22 (×2): qty 1

## 2022-06-22 MED ORDER — ACETAMINOPHEN 500 MG PO TABS
1000.0000 mg | ORAL_TABLET | Freq: Once | ORAL | Status: AC
  Administered 2022-06-22: 1000 mg via ORAL
  Filled 2022-06-22: qty 2

## 2022-06-22 MED ORDER — DAPAGLIFLOZIN PROPANEDIOL 10 MG PO TABS
10.0000 mg | ORAL_TABLET | Freq: Every day | ORAL | Status: DC
  Administered 2022-06-23: 10 mg via ORAL
  Filled 2022-06-22: qty 1

## 2022-06-22 MED ORDER — WARFARIN - PHARMACIST DOSING INPATIENT: Freq: Every day | Status: DC

## 2022-06-22 MED ORDER — ACETAMINOPHEN 325 MG PO TABS
650.0000 mg | ORAL_TABLET | ORAL | Status: DC | PRN
  Administered 2022-06-23 (×2): 650 mg via ORAL
  Filled 2022-06-22 (×2): qty 2

## 2022-06-22 MED ORDER — VITAMIN D (ERGOCALCIFEROL) 1.25 MG (50000 UNIT) PO CAPS: 50000.0000 [IU] | ORAL_CAPSULE | ORAL | Status: DC

## 2022-06-22 MED ORDER — SILDENAFIL CITRATE 20 MG PO TABS
20.0000 mg | ORAL_TABLET | Freq: Three times a day (TID) | ORAL | Status: DC
  Administered 2022-06-22 – 2022-06-23 (×3): 20 mg via ORAL
  Filled 2022-06-22 (×4): qty 1

## 2022-06-22 MED ORDER — ROSUVASTATIN CALCIUM 20 MG PO TABS
40.0000 mg | ORAL_TABLET | Freq: Every day | ORAL | Status: DC
  Administered 2022-06-22 – 2022-06-23 (×2): 40 mg via ORAL
  Filled 2022-06-22 (×2): qty 2

## 2022-06-22 MED ORDER — METHIMAZOLE 10 MG PO TABS
10.0000 mg | ORAL_TABLET | Freq: Every day | ORAL | Status: DC
  Administered 2022-06-22 – 2022-06-23 (×2): 10 mg via ORAL
  Filled 2022-06-22 (×2): qty 1

## 2022-06-22 NOTE — Procedures (Signed)
Preop diagnosis: Right posterior epistaxis Postop diagnosis: same Procedure: Anterior/posterior nasal packing Surgeon: Redmond Baseman Anesth: None Compl: None Findings: Right-sided bleeding. Description:  After discussing risks, benefits, and alternatives, the patient was placed in a seated position and a 10 cm Merocel pack was placed in the right nasal passage.  The pack was saturated with oxymetazoline.  A drip pad was changed serially for about 20 minutes until bleeding stopped.  She was returned to nursing care in stable condition.

## 2022-06-22 NOTE — ED Triage Notes (Signed)
Pt comes POV for nosebleed that started around 2am, is on coumadin. Pt is an LVAD pt

## 2022-06-22 NOTE — ED Notes (Signed)
Provider at bedside

## 2022-06-22 NOTE — ED Provider Notes (Signed)
[facility] Emergency Department Provider Note MRN:  [id number]  Arrival date & time: 06/22/22     Chief Complaint   Epistaxis   History of Present Illness   [name] is a 51 y.o. year-old female presents to the ED with chief complaint of epistaxis.  She uses and LVAD.  She is anticoagulated on coumadin.  Reports nosebleed that started tonight about 3 hours ago.  She has been unable to control the bleeding with pressure.  She states that she can feel it running down the back of her throat.  History provided by patient.   Review of Systems  Pertinent positive and negative review of systems noted in HPI.    Physical Exam   Vitals:   06/22/22 1315 06/22/22 1941  BP: 119/67 (!) 87/70  Pulse: 92 92  Resp: 16 18  Temp:  98.3 F (36.8 C)  SpO2: 99% 98%    CONSTITUTIONAL:  anxious-appearing, NAD NEURO:  Alert and oriented x 3, CN 3-12 grossly intact EYES:  eyes equal and reactive ENT/NECK:  Supple, no stridor, brisk bleeding from right nostril, unable to see the source CARDIO:  LVAD, appears well-perfused  PULM:  No respiratory distress,  GI/GU:  non-distended,  MSK/SPINE:  No gross deformities, no edema, moves all extremities  SKIN:  no rash, atraumatic   *Additional and/or pertinent findings included in MDM below  Diagnostic and Interventional Summary    EKG Interpretation  Date/Time:    Ventricular Rate:    PR Interval:    QRS Duration:   QT Interval:    QTC Calculation:   R Axis:     Text Interpretation:         Labs Reviewed  CBC - Abnormal; Notable for the following components:      Result Value   WBC 11.8 (*)    MCV 78.9 (*)    MCH 24.6 (*)    RDW 17.2 (*)    All other components within normal limits  PROTIME-INR - Abnormal; Notable for the following components:   Prothrombin Time 29.8 (*)    INR 2.9 (*)    All other components within normal limits  BASIC METABOLIC PANEL - Abnormal; Notable for the following  components:   Glucose, Bld 110 (*)    BUN 40 (*)    Creatinine, Ser 1.05 (*)    All other components within normal limits  CBC - Abnormal; Notable for the following components:   WBC 11.7 (*)    Hemoglobin 11.2 (*)    HCT 35.1 (*)    MCV 78.0 (*)    MCH 24.9 (*)    RDW 17.3 (*)    All other components within normal limits  LACTATE DEHYDROGENASE  LACTATE DEHYDROGENASE  PROTIME-INR  CBC  BASIC METABOLIC PANEL    No orders to display    Medications  cephALEXin (KEFLEX) capsule 500 mg (500 mg Oral Given 06/22/22 1535)  acetaminophen (TYLENOL) tablet 650 mg (has no administration in time range)  quiniDINE gluconate CR tablet 324 mg (has no administration in time range)  rosuvastatin (CRESTOR) tablet 40 mg (40 mg Oral Given 06/22/22 1532)  sildenafil (REVATIO) tablet 20 mg (20 mg Oral Given 06/22/22 1534)  spironolactone (ALDACTONE) tablet 25 mg (25 mg Oral Given 06/22/22 1532)  torsemide (DEMADEX) tablet 20 mg (20 mg Oral Given 06/22/22 1532)  dapagliflozin propanediol (FARXIGA) tablet 10 mg (has no administration in time range)  methimazole (TAPAZOLE) tablet 10 mg (10 mg Oral Given 06/22/22  1534)  predniSONE (DELTASONE) tablet 5 mg (5 mg Oral Given 06/22/22 1533)  potassium chloride SA (KLOR-CON M) CR tablet 60 mEq (60 mEq Oral Given 06/22/22 1531)  Vitamin D (Ergocalciferol) (DRISDOL) 1.25 MG (50000 UNIT) capsule 50,000 Units (has no administration in time range)  Warfarin - Pharmacist Dosing Inpatient (0 each Does not apply Hold 06/22/22 1523)  acetaminophen (TYLENOL) tablet 1,000 mg (1,000 mg Oral Given 06/22/22 0531)  LORazepam (ATIVAN) injection 0.5 mg (0.5 mg Intravenous Given 06/22/22 0610)     Procedures  /  Critical Care .Epistaxis Management  Date/Time: 06/22/2022 5:49 AM  Performed by: Montine Circle, PA-C Authorized by: Montine Circle, PA-C   Consent:    Consent obtained:  Verbal   Consent given by:  Patient   Risks, benefits, and alternatives were  discussed: yes     Risks discussed:  Bleeding, pain, nasal injury and infection   Alternatives discussed:  Observation Universal protocol:    Procedure explained and questions answered to patient or proxy's satisfaction: yes     Relevant documents present and verified: yes     Test results available: yes     Imaging studies available: yes     Required blood products, implants, devices, and special equipment available: yes     Site/side marked: yes     Immediately prior to procedure, a time out was called: yes     Patient identity confirmed:  Verbally with patient Procedure details:    Treatment site:  R posterior and L posterior   Treatment method:  Merocel sponge   Treatment complexity:  Extensive   Treatment episode: recurring   Post-procedure details:    Assessment:  Bleeding decreased   Procedure completion:  Tolerated with difficulty .Critical Care  Performed by: Montine Circle, PA-C Authorized by: Montine Circle, PA-C   Critical care provider statement:    Critical care time (minutes):  33   Critical care was necessary to treat or prevent imminent or life-threatening deterioration of the following conditions: Bleeding.   Critical care was time spent personally by me on the following activities:  Development of treatment plan with patient or surrogate, discussions with consultants, evaluation of patient's response to treatment, examination of patient, ordering and review of laboratory studies, ordering and review of radiographic studies, ordering and performing treatments and interventions, pulse oximetry, re-evaluation of patient's condition and review of old charts   ED Course and Medical Decision Making  I have reviewed the triage vital signs, the nursing notes, and pertinent available records from the EMR.  Social Determinants Affecting Complexity of Care: Patient has no clinically significant social determinants affecting this chief complaint..   ED  Course: Clinical Course as of 06/22/22 2203  Sun Jun 22, 2022  0627 I spoke with Dr. Redmond Baseman again after second packing.  Patient having blood running down the back of her throat.  Dr. Redmond Baseman suggest one additional attempt be removing the 1st mericel and inserting another.  I attempted this, but the procedure was terminated by the patient.  She asked that I remove the left packing as well, which I did and she is holding pressure.    I notified Dr. Christy Gentles, who spoke with Dr. Redmond Baseman that I was unsuccessful.  Appreciate Dr. Redmond Baseman for coming to consult. [RB]  [id number] Dr. Redmond Baseman from ENT coming to see. Is on blood thinners, coumadin. LVAD patient.  [CG]    Clinical Course User Index [CG] Azucena Cecil, PA-C [RB] Montine Circle, PA-C    Medical Decision  Making Patient here with nosebleed.  She is anticoagulated on Coumadin.  She is an LVAD patient.  We initially attempted pressure followed by Afrin and pressure, but when this failed, the right nostril was packed with 10 cm merocel sponge.  Patient was hemostatic for 5 to 10 minutes, but then blood started coming from the left nostril.  She also reports that it is drained on the back of her throat.  At this point, I discussed the case with Dr. Redmond Baseman, from ENT, who recommends packing the left side as well.  Will observe her for about 30 minutes.  If no improvement, Dr. Redmond Baseman states that he will come see the patient in the emergency department and may need to take her to the OR.  Amount and/or Complexity of Data Reviewed Labs: ordered.    Details: INR 2.9 Hemoglobin 12.0  Risk OTC drugs. Prescription drug management. Decision regarding hospitalization.     Consultants: I discussed the case with Dr. Redmond Baseman, who recommends packing both sides.   Treatment and Plan: Patient's exam and diagnostic results are concerning for epistaxis requiring close monitoring.  Feel that patient will need admission to the hospital for further treatment and  evaluation.    Final Clinical Impressions(s) / ED Diagnoses     ICD-10-CM   1. Epistaxis  R04.0       ED Discharge Orders          Ordered    cephALEXin (KEFLEX) 500 MG capsule  3 times daily,   Status:  Discontinued       Note to Pharmacy: 5 day course - first dose given 12/31 in ER.   06/22/22 0834              Discharge Instructions Discussed with and Provided to Patient:   Discharge Instructions   None      Montine Circle, PA-C 06/22/22 2203    Ripley Fraise, MD 06/25/22 2310

## 2022-06-22 NOTE — H&P (Signed)
Advanced Heart Failure VAD History and Physical Note   PCP-Cardiologist: [name], MD   Reason for Admission: Epistaxis  HPI:    [name] is a 51 y.o. 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    Patient was admitted in 3/23 with recurrent CHF exacerbation with low cardiac output.  She was also noted to be hyperthyroidism but amiodarone had to be continued due to VT and poorly tolerated atrial fibrillation.  She required Impella 5.5 placement and eventual HM3 LVAD placement on 09/04/21.  Post-op course complicated by atrial fibrillation, malnutrition, and deconditioning. She went to CIR prior to discharge.    Patient developed heavy vaginal bleeding in 7/23, she was seen in the ER and required 2 units PRBCs.  She was admitted in 8/23 and had hysterectomy + BSO, LVAD speed increased to 5900 rpm on ramp echo.  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    Patient was in her usual state of health until last night.  She had been doing, well, no significant dyspnea with her usual activities.  She woke up last night with severe epistaxis from right nostril that would not stop.  She came to the ER.  She was noted to have right posterior epistaxis that required ENT to come in and pack.  INR was 2.9, hgb 12.  Keflex was started with packing and it was recommended that she come in the hospital for observation overnight.   LVAD INTERROGATION:  HeartMate 3 LVAD:  Flow 5.3 liters/min, speed 5900, power 4.8, PI 4.2.  Many PI events.    Review of Systems: All systems reviewed and negative except as per HPI.   Home Medications Prior to Admission medications   Medication Sig Start Date End Date Taking? Authorizing Provider  aspirin EC 81 MG tablet Take 1 tablet (81 mg total) by mouth daily. Swallow whole. 10/08/21   Larey Dresser, MD  dapagliflozin  propanediol (FARXIGA) 10 MG TABS tablet Take 1 tablet (10 mg total) by mouth daily before breakfast. 05/27/22   Larey Dresser, MD  methimazole (TAPAZOLE) 10 MG tablet Take 1 tablet (10 mg total) by mouth daily. 05/12/22   Shamleffer, Melanie Crazier, MD  potassium chloride SA (KLOR-CON M) 20 MEQ tablet Take 3 tablets (60 mEq total) by mouth daily. 02/19/22   Larey Dresser, MD  predniSONE (DELTASONE) 5 MG tablet Take 1 tablet (5 mg total) by mouth daily with breakfast. 05/12/22   Shamleffer, Melanie Crazier, MD  quiniDINE gluconate 324 MG CR tablet TAKE 1 TABLET BY MOUTH TWICE A DAY 05/14/22   Larey Dresser, MD  rosuvastatin (CRESTOR) 40 MG tablet TAKE 1 TABLET BY MOUTH ONCE DAILY 03/24/22   Frann Rider, NP  sildenafil (REVATIO) 20 MG tablet Take 1 tablet (20 mg total) by mouth 3 (three) times daily. 10/28/21   Larey Dresser, MD  spironolactone (ALDACTONE) 25 MG tablet Take 1 tablet (25 mg total) by mouth daily. Restart on 02/10/22. 02/10/22 02/05/23  Ledora Bottcher, PA  torsemide (DEMADEX) 20 MG tablet Take 1 tablet (20 mg total) by mouth daily. 05/27/22   Larey Dresser, MD  Vitamin D, Ergocalciferol, (DRISDOL) 1.25 MG (50000 UNIT) CAPS capsule Take 1 capsule (50,000 Units total) by mouth every 7 (seven) days. 04/23/22   Bowen, Collene Leyden, DO  warfarin (COUMADIN) 2 MG tablet Take  6 mg every Tuesday/Thursday/Saturday and 4 mg all other days or as directed by LVAD Clinic. 06/05/22   Larey Dresser, MD    Past Medical History: Past Medical History:  Diagnosis Date   Anemia    Automatic implantable cardioverter-defibrillator in situ    B12 deficiency    Cancer ([place])    Chronic CHF (congestive heart failure) ([place])    a. EF 15-20% b. RHC (09/2013) RA 14, RV 57/22, PA 64/36 (48), PCWP 18, FIck CO/CI 3.7/1.6, PVR 8.1 WU, PA sat 47%    Heart valve problem    History of heart attack    History of hypothyroidism    History of stomach ulcers    HLD (hyperlipidemia)    Hyperthyroidism     Hypotension    Hypothyroidism    Morbid obesity ([place])    Myocardial infarction ([place]) 08/2013   Nocturnal dyspnea    Nonischemic cardiomyopathy ([facility])    Palpitations    Sinus tachycardia    Sleep apnea    Snoring-prob OSA 09/04/2011   SOB (shortness of breath)    Sprint Fidelis ICD lead RECALL  6949    Stroke ([place]) 2021   some residual right-sided weakness   UARS (upper airway resistance syndrome) 09/04/2011   HST 12/2013:  AHI 4/hr (numerous episodes of airflow reduction that did not have concomitant desaturation)    Vitamin D deficiency     Past Surgical History: Past Surgical History:  Procedure Laterality Date   BREATH TEK H PYLORI N/A 11/09/2014   Procedure: BREATH TEK H PYLORI;  Surgeon: Greer Pickerel, MD;  Location: [facility];  Service: General;  Laterality: N/A;   CARDIAC CATHETERIZATION  ~ 2006; 09/2013   CARDIAC CATHETERIZATION N/A 05/23/2016   Procedure: Right Heart Cath;  Surgeon: Larey Dresser, MD;  Location: [facility];  Service: Cardiovascular;  Laterality: N/A;   CARDIAC DEFIBRILLATOR PLACEMENT  2006; 12/26/2013   Medtronic [id number]; 12-2013 ICD gen change and RV lead revision with new 6935 RV lead by Dr [name]   CENTRAL LINE INSERTION  08/28/2021   Procedure: CENTRAL LINE INSERTION;  Surgeon: Larey Dresser, MD;  Location: [facility];  Service: Cardiovascular;;   CESAREAN SECTION  1999   IMPLANTABLE CARDIOVERTER DEFIBRILLATOR GENERATOR CHANGE N/A 12/26/2013   Procedure: IMPLANTABLE CARDIOVERTER DEFIBRILLATOR GENERATOR CHANGE;  Surgeon: Deboraha Sprang, MD;  [facility];  Service: Cardiovascular;  Laterality: N/A;   INSERTION OF IMPLANTABLE LEFT VENTRICULAR ASSIST DEVICE N/A 09/03/2021   Procedure: INSERTION OF IMPLANTABLE LEFT VENTRICULAR ASSIST DEVICE/ Heartmate 3;  Surgeon: Dahlia Byes, MD;  Location: [place];  Service: Open Heart Surgery;  Laterality: N/A;   IR CT HEAD LTD  04/17/2020   IR CT HEAD LTD  04/17/2020   IR INTRA CRAN  STENT  04/17/2020   IR PERCUTANEOUS ART THROMBECTOMY/INFUSION INTRACRANIAL INC DIAG ANGIO  04/17/2020   IR RADIOLOGIST EVAL & MGMT  06/05/2020   LEAD REVISION N/A 12/26/2013   Procedure: LEAD REVISION;  Surgeon: Deboraha Sprang, MD;  Location: [facility] CATH LAB;  Service: Cardiovascular;  Laterality: N/A;   PLACEMENT OF IMPELLA LEFT VENTRICULAR ASSIST DEVICE Right 08/29/2021   Procedure: PLACEMENT OF IMPELLA 5.5 LEFT VENTRICULAR ASSIST DEVICE;  Surgeon: Gaye Pollack, MD;  Location: [name];  Service: Open Heart Surgery;  Laterality: Right;   RADIOLOGY WITH ANESTHESIA N/A 04/17/2020   Procedure: Code Stroke;  Surgeon: Luanne Bras, MD;  Location: [place];  Service: Radiology;  Laterality: N/A;  REMOVAL OF IMPELLA LEFT VENTRICULAR ASSIST DEVICE N/A 09/03/2021   Procedure: REMOVAL OF IMPELLA LEFT VENTRICULAR ASSIST DEVICE;  Surgeon: Dahlia Byes, MD;  Location: [place];  Service: Open Heart Surgery;  Laterality: N/A;   RIGHT HEART CATH N/A 08/28/2021   Procedure: RIGHT HEART CATH;  Surgeon: Larey Dresser, MD;  Location: [place];  Service: Cardiovascular;  Laterality: N/A;   RIGHT HEART CATHETERIZATION N/A 02/15/2014   Procedure: RIGHT HEART CATH;  Surgeon: Larey Dresser, MD;  Location: [facility] CATH LAB;  Service: Cardiovascular;  Laterality: N/A;   ROBOTIC ASSISTED TOTAL HYSTERECTOMY WITH BILATERAL SALPINGO OOPHERECTOMY N/A 02/05/2022   Procedure: XI ROBOTIC ASSISTED TOTAL HYSTERECTOMY WITH BILATERAL SALPINGO OOPHORECTOMY, CYSTOSCOPY, UTERUS GREATER THAN 250 GRAMS;  Surgeon: Lafonda Mosses, MD;  Location: [facility];  Service: Gynecology;  Laterality: N/A;   TEE WITHOUT CARDIOVERSION N/A 08/29/2021   Procedure: TRANSESOPHAGEAL ECHOCARDIOGRAM (TEE);  Surgeon: Gaye Pollack, MD;  Location: [place];  Service: Open Heart Surgery;  Laterality: N/A;   TEE WITHOUT CARDIOVERSION N/A 09/03/2021   Procedure: TRANSESOPHAGEAL ECHOCARDIOGRAM (TEE);  Surgeon: Dahlia Byes, MD;  Location: [place];  Service: Open  Heart Surgery;  Laterality: N/A;   TUBAL LIGATION  1999    Family History: Family History  Problem Relation Age of Onset   Hypertension Mother    Heart disease Mother    Obesity Mother    Heart Problems Mother    Hypertension Father    High blood pressure Father    Stroke Father    Heart disease Maternal Grandmother    Colon cancer Neg Hx    Breast cancer Neg Hx    Ovarian cancer Neg Hx    Endometrial cancer Neg Hx    Pancreatic cancer Neg Hx    Prostate cancer Neg Hx     Social History: Social History   Socioeconomic History   Marital status: Married    Spouse name: Zabdi Mis   Number of children: 2   Years of education: Not on file   Highest education level: Not on file  Occupational History   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  Tobacco Use   Smoking status: Never   Smokeless tobacco: Never  Vaping Use   Vaping Use: Never used  Substance and Sexual Activity   Alcohol use: No   Drug use: No   Sexual activity: Not Currently  Other Topics Concern   Not on file  Social History Narrative   [place] sometimes   Right handed   Social Determinants of Health   Financial Resource Strain: Low Risk  (08/28/2021)   Overall Financial Resource Strain (CARDIA)    Difficulty of Paying Living Expenses: Not very hard  Food Insecurity: No Food Insecurity (08/28/2021)   Hunger Vital Sign    Worried About Running Out of Food in the Last Year: Never true    Ran Out of Food in the Last Year: Never true  Transportation Needs: No Transportation Needs (08/28/2021)   PRAPARE - Hydrologist (Medical): No    Lack of Transportation (Non-Medical): No  Physical Activity: Not on file  Stress: Not on file  Social Connections: Not on file    Allergies:  No Known Allergies  Objective:    Vital Signs:   Temp:  [97.7 F (36.5 C)-97.9 F (36.6 C)] 97.9 F (36.6 C) (12/31 0909) Pulse Rate:  [59-81] 81 (12/31 0909) Resp:  [14-20] 14 (12/31  0909) BP: (90-124)/(44-100)  124/100 (12/31 0909) SpO2:  [97 %-100 %] 100 % (12/31 0909)   There were no vitals filed for this visit.  Mean arterial Pressure 90s-100s  Physical Exam    General:  Well appearing. No resp difficulty HEENT: Right nostril packed Neck: supple. JVP not elevated. Carotids 2+ bilat; no bruits. No lymphadenopathy or thyromegaly appreciated. Cor: Mechanical heart sounds with LVAD hum present. Lungs: Clear Abdomen: soft, nontender, nondistended. No hepatosplenomegaly. No bruits or masses. Good bowel sounds. Driveline: C/D/I; securement device intact and driveline incorporated Extremities: no cyanosis, clubbing, rash, edema Neuro: alert & orientedx3, cranial nerves grossly intact. moves all 4 extremities w/o difficulty. Affect pleasant   Telemetry   NSR 90s (personally reviewed)  EKG   pending  Labs    Basic Metabolic Panel: No results for input(s): "NA", "K", "CL", "CO2", "GLUCOSE", "BUN", "CREATININE", "CALCIUM", "MG", "PHOS" in the last 168 hours.  Liver Function Tests: No results for input(s): "AST", "ALT", "ALKPHOS", "BILITOT", "PROT", "ALBUMIN" in the last 168 hours. No results for input(s): "LIPASE", "AMYLASE" in the last 168 hours. No results for input(s): "AMMONIA" in the last 168 hours.  CBC: Recent Labs  Lab 06/22/22 0510  WBC 11.8*  HGB 12.0  HCT 38.5  MCV 78.9*  PLT 292    Cardiac Enzymes: No results for input(s): "CKTOTAL", "CKMB", "CKMBINDEX", "TROPONINI" in the last 168 hours.  BNP: BNP (last 3 results) Recent Labs    09/04/21 0349 09/10/21 0039 09/17/21 0140  BNP 358.8* 521.3* 279.4*    ProBNP (last 3 results) No results for input(s): "PROBNP" in the last 8760 hours.   CBG: No results for input(s): "GLUCAP" in the last 168 hours.  Coagulation Studies: Recent Labs    06/22/22 0510  LABPROT 29.8*  INR 2.9*    Other results: EKG: pending.   Imaging    No results found.    Assessment/Plan:    1.  Epistaxis: Right posterior epistaxis, s/p packing by ENT.  Hgb 12, INR 2.9.  - Hold ASA for now.  - Allow INR to drift down to goal 2-2.5.  - Continue Keflex while packing is in place.  - Will watch over night, can go home in am if she remains stable.  Will need followup in 5 days in Dr. Redmond Baseman' office to remove pacing.   2. Chronic systolic HF: Nonischemic cardiomyopathy.  She has a Medtronic ICD. Cause for cardiomyopathy is uncertain, initially identified peri-partum (after son's birth), so cannot rule out peri-partum CMP.  On and off, she has had frequent PVCs.   RHC in 8/15 showed normal filling pressures and preserved cardiac output. Echo in 4/22 with EF 25%, severe LV dilation.  CPX (8/22) with mild-moderate HF limitation.  Echo in 3/23 with EF < 20% with severe dilation, normal RV size with mildly decreased systolic function, moderate-severe functional MR with dilated IVC. 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 Impella 5.5 placed 08/29/21.  Patient had HM3 LVAD placed 09/04/21 and speed adjusted by ramp echo up to 5800 rpm and again up to 5900 rpm. LVAD interrogation shows stable parameters today except for frequent PI events.  She is not volume overloaded, NYHA class II symptoms.   - Continue home torsemide and Farxiga pending BMET check.  - Need to check LDH as well.  - Continue spironolactone 25 mg daily.   - Hold ASA for now as above.  -  Continue sildenafil 20 mg tid  - Warfarin for VAD, goal INR 2-2.5. Hold for now with INR 2.9 and epistaxis.   3. 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  She had been on Levoxyl at home prior to 3/23 admission, noted to be hyperthyroid in 3/23.  This likely contributes to her CHF. She had recurrent VT and poorly tolerated AF which made it difficult for [name] to stop  amiodarone. Initially treated w/ IV Solumedrol  -> transitioned to prednisone and methimazole.  Endocrinology following, prednisone being titrated down (now 5 mg daily).  She is off amiodarone. Last TSH was normal.  - Management per endocrinology - Will check TSH today.  4. PVCs/VT: EP consulted given complicated situation with hyperthyroidism and VT.  - Continue quinidine.  - Now off amiodarone.  5. CVA: CVA in 10/21, had thrombectomy with stent placement.  No LV thrombus noted and no atrial fibrillation noted.  - Was on ASA 81 for this, will hold for now with epistaxis and restart in future.  6. Anemia: Stable hgb this morning, repeat in pm.   7. CKD stage 3:   - BMET today.  8. Mitral regurgitation: Moderate-severe functional MR pre-LVAD.  Unlikely to be Mitraclip candidate with severely dilated LV (>7 cm). Mitral regurgitation was mild on 8/23 echo.  9. Left ovarian mass: Hysterectomy + BSO.  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   10. Atrial fibrillation: Tolerates poorly.  NSR today.  - Watch closely off amiodarone.    I reviewed the LVAD parameters from today, and compared the results to the patient's prior recorded data.  No programming changes were made.  The LVAD is functioning within specified parameters.  The patient performs LVAD self-test daily.  LVAD interrogation was negative for any significant power changes, alarms or PI events/speed drops.  LVAD equipment check completed and is in good working order.  Back-up equipment present.   LVAD education done on emergency procedures and precautions and reviewed exit site care.  Length of Stay: 0  [name], MD 06/22/2022, 9:35 AM  VAD Team Pager [phone] (7am - 7am) +++VAD ISSUES ONLY+++   Advanced Heart Failure Team Pager [phone] (M-F; [place])  Please contact [facility] for night-coverage after hours (5p -7a ) and weekends on [email or web address] for all non- LVAD Issues

## 2022-06-22 NOTE — Consult Note (Signed)
Reason for Consult: Epistaxis Referring Physician: ER  DOMINGA MCDUFFIE is an 51 y.o. female.  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  She could not control it at home and came to the ER where the PA attempted to pack the right side but did not place the 10 cm pack fully into position.  This resulted in bleeding then out the left side and still down the throat.  Packing the left side was not helpful.  Consultation was requested.  Upon arrival, there was no provider in the room, no pack in either side of the nose, and the patient and her husband holding the nose with a washcloth with active bleeding.  Past Medical History:  Diagnosis Date   Anemia    Automatic implantable cardioverter-defibrillator in situ    B12 deficiency    Cancer (HCC)    Chronic CHF (congestive heart failure) (HCC)    a. EF 15-20% b. RHC (09/2013) RA 14, RV 57/22, PA 64/36 (48), PCWP 18, FIck CO/CI 3.7/1.6, PVR 8.1 WU, PA sat 47%    Heart valve problem    History of heart attack    History of hypothyroidism    History of stomach ulcers    HLD (hyperlipidemia)    Hyperthyroidism    Hypotension    Hypothyroidism    Morbid obesity ([place])    Myocardial infarction ([place]) 08/2013   Nocturnal dyspnea    Nonischemic cardiomyopathy (HCC)    Palpitations    Sinus tachycardia    Sleep apnea    Snoring-prob OSA 09/04/2011   SOB (shortness of breath)    Sprint Fidelis ICD lead RECALL  6949    Stroke ([place]) 2021   some residual right-sided weakness   UARS (upper airway resistance syndrome) 09/04/2011   HST 12/2013:  AHI 4/hr (numerous episodes of airflow reduction that did not have concomitant desaturation)    Vitamin D deficiency     Past Surgical History:  Procedure Laterality Date   BREATH TEK H PYLORI N/A 11/09/2014   Procedure: BREATH TEK H PYLORI;  Surgeon: Greer Pickerel, MD;  Location: [facility];  Service: General;   Laterality: N/A;   CARDIAC CATHETERIZATION  ~ 2006; 09/2013   CARDIAC CATHETERIZATION N/A 05/23/2016   Procedure: Right Heart Cath;  Surgeon: Larey Dresser, MD;  Location: [facility];  Service: Cardiovascular;  Laterality: N/A;   CARDIAC DEFIBRILLATOR PLACEMENT  2006; 12/26/2013   Medtronic [id number]; 12-2013 ICD gen change and RV lead revision with new 6935 RV lead by Dr [name]   CENTRAL LINE INSERTION  08/28/2021   Procedure: CENTRAL LINE INSERTION;  Surgeon: Larey Dresser, MD;  Location: [facility];  Service: Cardiovascular;;   CESAREAN SECTION  1999   IMPLANTABLE CARDIOVERTER DEFIBRILLATOR GENERATOR CHANGE N/A 12/26/2013   Procedure: IMPLANTABLE CARDIOVERTER DEFIBRILLATOR GENERATOR CHANGE;  Surgeon: Deboraha Sprang, MD;  Location: [facility];  Service: Cardiovascular;  Laterality: N/A;   INSERTION OF IMPLANTABLE LEFT VENTRICULAR ASSIST DEVICE N/A 09/03/2021   Procedure: INSERTION OF IMPLANTABLE LEFT VENTRICULAR ASSIST DEVICE/ Heartmate 3;  Surgeon: Dahlia Byes, MD;  Location: [facility];  Service: Open Heart Surgery;  Laterality: N/A;   IR CT HEAD LTD  04/17/2020   IR CT HEAD LTD  04/17/2020   IR INTRA CRAN STENT  04/17/2020   IR PERCUTANEOUS ART THROMBECTOMY/INFUSION INTRACRANIAL INC DIAG ANGIO  04/17/2020   IR RADIOLOGIST EVAL &  MGMT  06/05/2020   LEAD REVISION N/A 12/26/2013   Procedure: LEAD REVISION;  Surgeon: Deboraha Sprang, MD;  Location: [facility] CATH LAB;  Service: Cardiovascular;  Laterality: N/A;   PLACEMENT OF IMPELLA LEFT VENTRICULAR ASSIST DEVICE Right 08/29/2021   Procedure: PLACEMENT OF IMPELLA 5.5 LEFT VENTRICULAR ASSIST DEVICE;  Surgeon: Gaye Pollack, MD;  Location: [place];  Service: Open Heart Surgery;  Laterality: Right;   RADIOLOGY WITH ANESTHESIA N/A 04/17/2020   Procedure: Code Stroke;  Surgeon: Luanne Bras, MD;  Location: [facility];  Service: Radiology;  Laterality: N/A;   REMOVAL OF IMPELLA LEFT VENTRICULAR ASSIST DEVICE N/A 09/03/2021   Procedure:  REMOVAL OF IMPELLA LEFT VENTRICULAR ASSIST DEVICE;  Surgeon: Dahlia Byes, MD;  Location: [place];  Service: Open Heart Surgery;  Laterality: N/A;   RIGHT HEART CATH N/A 08/28/2021   Procedure: RIGHT HEART CATH;  Surgeon: Larey Dresser, MD;  Location: [facility];  Service: Cardiovascular;  Laterality: N/A;   RIGHT HEART CATHETERIZATION N/A 02/15/2014   Procedure: RIGHT HEART CATH;  Surgeon: Larey Dresser, MD;  Location: [facility] CATH LAB;  Service: Cardiovascular;  Laterality: N/A;   ROBOTIC ASSISTED TOTAL HYSTERECTOMY WITH BILATERAL SALPINGO OOPHERECTOMY N/A 02/05/2022   Procedure: XI ROBOTIC ASSISTED TOTAL HYSTERECTOMY WITH BILATERAL SALPINGO OOPHORECTOMY, CYSTOSCOPY, UTERUS GREATER THAN 250 GRAMS;  Surgeon: Lafonda Mosses, MD;  Location: [facility];  Service: Gynecology;  Laterality: N/A;   TEE WITHOUT CARDIOVERSION N/A 08/29/2021   Procedure: TRANSESOPHAGEAL ECHOCARDIOGRAM (TEE);  Surgeon: Gaye Pollack, MD;  Location: [place];  Service: Open Heart Surgery;  Laterality: N/A;   TEE WITHOUT CARDIOVERSION N/A 09/03/2021   Procedure: TRANSESOPHAGEAL ECHOCARDIOGRAM (TEE);  Surgeon: Dahlia Byes, MD;  Location: [place];  Service: Open Heart Surgery;  Laterality: N/A;   TUBAL LIGATION  1999    Family History  Problem Relation Age of Onset   Hypertension Mother    Heart disease Mother    Obesity Mother    Heart Problems Mother    Hypertension Father    High blood pressure Father    Stroke Father    Heart disease Maternal Grandmother    Colon cancer Neg Hx    Breast cancer Neg Hx    Ovarian cancer Neg Hx    Endometrial cancer Neg Hx    Pancreatic cancer Neg Hx    Prostate cancer Neg Hx     Social History:  reports that she has never smoked. She has never used smokeless tobacco. She reports that she does not drink alcohol and does not use drugs.  Allergies: No Known Allergies  Medications: I have reviewed the patient's current medications.  Results for orders placed or performed  during the hospital encounter of 06/22/22 (from the past 48 hour(s))  CBC     Status: Abnormal   Collection Time: 06/22/22  5:10 AM  Result Value Ref Range   WBC 11.8 (H) 4.0 - 10.5 K/uL   RBC 4.88 3.87 - 5.11 MIL/uL   Hemoglobin 12.0 12.0 - 15.0 g/dL   HCT 38.5 36.0 - 46.0 %   MCV 78.9 (L) 80.0 - 100.0 fL   MCH 24.6 (L) 26.0 - 34.0 pg   MCHC 31.2 30.0 - 36.0 g/dL   RDW 17.2 (H) 11.5 - 15.5 %   Platelets 292 150 - 400 K/uL   nRBC 0.0 0.0 - 0.2 %    Comment: Performed at [facility], [place] [address] [place], [place] [address]  Protime-INR  Status: Abnormal   Collection Time: 06/22/22  5:10 AM  Result Value Ref Range   Prothrombin Time 29.8 (H) 11.4 - 15.2 seconds   INR 2.9 (H) 0.8 - 1.2    Comment: (NOTE) INR goal varies based on device and disease states. Performed at [facility], [place] [address], [place], [place] [address]    *Note: Due to a large number of results and/or encounters for the requested time period, some results have not been displayed. A complete set of results can be found in Results Review.    No results found.  Review of Systems  HENT:  Positive for nosebleeds.   Neurological:  Positive for headaches.  All other systems reviewed and are negative.  Blood pressure 90/64, pulse 61, temperature 97.7 F (36.5 C), temperature source Axillary, resp. rate 20, SpO2 97 %. Physical Exam Constitutional:      Appearance: Normal appearance. She is obese.  HENT:     Head: Normocephalic and atraumatic.     Right Ear: External ear normal.     Left Ear: External ear normal.     Nose:     Comments: Right-sided active bleeding, no pack in place.    Mouth/Throat:     Mouth: Mucous membranes are moist.     Comments: Blood in oropharynx. Eyes:     Extraocular Movements: Extraocular movements intact.     Conjunctiva/sclera: Conjunctivae normal.     Pupils: Pupils are equal, round, and reactive to light.  Cardiovascular:     Rate and Rhythm: Normal  rate.  Pulmonary:     Effort: Pulmonary effort is normal.  Musculoskeletal:     Cervical back: Normal range of motion.  Skin:    General: Skin is warm and dry.  Neurological:     General: No focal deficit present.     Mental Status: She is alert and oriented to person, place, and time.  Psychiatric:        Mood and Affect: Mood normal.        Behavior: Behavior normal.        Thought Content: Thought content normal.        Judgment: Judgment normal.     Assessment/Plan: Right posterior epistaxis, anti-coagulation  A right-sided 10 cm Merocel pack was placed.  Bleeding continued for about 20 minutes but tapered off.  See procedure note.  Consider admission for 24 hour observation.  If bleeding remains controlled with the pack in place, she can be discharged and follow-up with me in 5 days for pack removal.  Recommend Keflex 500 mg three times daily for 5 days.  Melida Quitter 06/22/2022, 7:22 AM

## 2022-06-22 NOTE — ED Notes (Signed)
ENT Provider at bedside

## 2022-06-22 NOTE — Progress Notes (Addendum)
Dr. [name] will be evaluating patient for discharge but recommends sending in rx for cephalexin '500mg'$  TID x 5 days as recommended by ENT. Sent in #14 (since first dose requested to be given here in ER) - [facility] [address] since [facility] closed [facility]. Otherwise Dr. [name] recommended to continue home medicines. He will be in touch with LVAD coordinator to arrange close f/u.  Addendum: Dr. [name] recommends admit instead. [facility]. Admit orders written per our discussion. He recommends to hold ASA. 2pm CBC also ordered - will s/o to on call APP to follow up this afternoon.

## 2022-06-22 NOTE — Progress Notes (Signed)
ANTICOAGULATION CONSULT NOTE - Initial Consult  Pharmacy Consult for warfarin  Indication:  LVAD  No Known Allergies  Patient Measurements:   Vital Signs: Temp: 98.1 F (36.7 C) (12/31 1313) Temp Source: Oral (12/31 1313) BP: 119/67 (12/31 1315) Pulse Rate: 92 (12/31 1315)  Labs: Recent Labs    06/22/22 0510 06/22/22 0744  HGB 12.0  --   HCT 38.5  --   PLT 292  --   LABPROT 29.8*  --   INR 2.9*  --   CREATININE  --  1.05*    CrCl cannot be calculated (Unknown ideal weight.).   Medical History: Past Medical History:  Diagnosis Date   Anemia    Automatic implantable cardioverter-defibrillator in situ    B12 deficiency    Cancer (HCC)    Chronic CHF (congestive heart failure) (HCC)    a. EF 15-20% b. RHC (09/2013) RA 14, RV 57/22, PA 64/36 (48), PCWP 18, FIck CO/CI 3.7/1.6, PVR 8.1 WU, PA sat 47%    Heart valve problem    History of heart attack    History of hypothyroidism    History of stomach ulcers    HLD (hyperlipidemia)    Hyperthyroidism    Hypotension    Hypothyroidism    Morbid obesity ([place])    Myocardial infarction ([place]) 08/2013   Nocturnal dyspnea    Nonischemic cardiomyopathy (HCC)    Palpitations    Sinus tachycardia    Sleep apnea    Snoring-prob OSA 09/04/2011   SOB (shortness of breath)    Sprint Fidelis ICD lead RECALL  6949    Stroke ([place]) 2021   some residual right-sided weakness   UARS (upper airway resistance syndrome) 09/04/2011   HST 12/2013:  AHI 4/hr (numerous episodes of airflow reduction that did not have concomitant desaturation)    Vitamin D deficiency     Assessment: Patient here with epistaxis s/p packing with ENT. INR today is 2.9. Started on Keflex per ENT. Warfarin regimen PTA is '6mg'$  Tues/Thurs/Sat and '4mg'$  all other days.   Goal of Therapy:  INR 2-3 Monitor platelets by anticoagulation protocol: Yes   Plan:  Per MD will hold warfarin dose today and allow to drift down to goal range of 2-2.5.  Repeat CBC this  afternoon.  F/u for resolution of S/SX of bleeding.   Esmeralda Arthur, PharmD, BCCCP  06/22/2022,2:32 PM

## 2022-06-22 NOTE — ED Provider Notes (Signed)
  Physical Exam  BP (!) 96/44 (BP Location: Left Arm) Comment: Pt states this is her baseline  Pulse (!) 59   Temp 97.9 F (36.6 C) (Axillary)   Resp 18   SpO2 100%   Physical Exam Vitals and nursing note reviewed.  Constitutional:      General: She is not in acute distress.    Appearance: She is well-developed.  HENT:     Head: Normocephalic and atraumatic.     Nose:     Comments: Bilateral nasal packing in place, no active hemorrhaging Eyes:     Conjunctiva/sclera: Conjunctivae normal.  Cardiovascular:     Rate and Rhythm: Normal rate and regular rhythm.     Heart sounds: No murmur heard. Pulmonary:     Effort: Pulmonary effort is normal. No respiratory distress.     Breath sounds: Normal breath sounds.  Abdominal:     Palpations: Abdomen is soft.     Tenderness: There is no abdominal tenderness.  Musculoskeletal:        General: No swelling.     Cervical back: Neck supple.  Skin:    General: Skin is warm and dry.     Capillary Refill: Capillary refill takes less than 2 seconds.  Neurological:     Mental Status: She is alert.  Psychiatric:        Mood and Affect: Mood normal.     Procedures  Procedures  ED Course / MDM   Clinical Course as of 06/22/22 0847  Sun Jun 22, 2022  0627 I spoke with Dr. Redmond Baseman again after second packing.  Patient having blood running down the back of her throat.  Dr. Redmond Baseman suggest one additional attempt be removing the 1st mericel and inserting another.  I attempted this, but the procedure was terminated by the patient.  She asked that I remove the left packing as well, which I did and she is holding pressure.    I notified Dr. Christy Gentles, who spoke with Dr. Redmond Baseman that I was unsuccessful.  Appreciate Dr. Redmond Baseman for coming to consult. [RB]  [id number] Dr. Redmond Baseman from ENT coming to see. Is on blood thinners, coumadin. LVAD patient.  [CG]    Clinical Course User Index [CG] Azucena Cecil, PA-C [RB] Montine Circle, PA-C   Medical Decision  Making Amount and/or Complexity of Data Reviewed Labs: ordered.  Risk OTC drugs. Prescription drug management. Decision regarding hospitalization.   Patient signed out to me at shift change.  Please see previous provider note for further details.  In short this is a 51 year old LVAD patient on Coumadin who presents for persistent epistaxis since 0200 this morning.  Initial attempts to pack nares were unsuccessful.  Patient signed out to me pending ENT assessment.  Dr. Redmond Baseman, ENT, has seen the patient and packed her bilateral naris.  Dr. Redmond Baseman is suggesting 24-hour observation and then discharge.  Dr. Redmond Baseman recommending 5-day follow-up in office, Keflex 500 mg 3 times daily for 5 days.  Initial dose of Keflex ordered.  Patient admitted to cardiology service due to LVAD status.       Azucena Cecil, PA-C 06/22/22 0848    Pattricia Boss, MD 06/22/22 [phone]

## 2022-06-23 DIAGNOSIS — I5022 Chronic systolic (congestive) heart failure: Secondary | ICD-10-CM | POA: Diagnosis not present

## 2022-06-23 DIAGNOSIS — E039 Hypothyroidism, unspecified: Secondary | ICD-10-CM | POA: Diagnosis not present

## 2022-06-23 DIAGNOSIS — I4891 Unspecified atrial fibrillation: Secondary | ICD-10-CM | POA: Diagnosis not present

## 2022-06-23 DIAGNOSIS — Z95810 Presence of automatic (implantable) cardiac defibrillator: Secondary | ICD-10-CM | POA: Diagnosis not present

## 2022-06-23 DIAGNOSIS — I051 Rheumatic mitral insufficiency: Secondary | ICD-10-CM | POA: Diagnosis not present

## 2022-06-23 DIAGNOSIS — Z95811 Presence of heart assist device: Secondary | ICD-10-CM | POA: Diagnosis not present

## 2022-06-23 DIAGNOSIS — N83202 Unspecified ovarian cyst, left side: Secondary | ICD-10-CM | POA: Diagnosis not present

## 2022-06-23 DIAGNOSIS — Z7901 Long term (current) use of anticoagulants: Secondary | ICD-10-CM | POA: Diagnosis not present

## 2022-06-23 DIAGNOSIS — N183 Chronic kidney disease, stage 3 unspecified: Secondary | ICD-10-CM | POA: Diagnosis not present

## 2022-06-23 DIAGNOSIS — Z8673 Personal history of transient ischemic attack (TIA), and cerebral infarction without residual deficits: Secondary | ICD-10-CM | POA: Diagnosis not present

## 2022-06-23 DIAGNOSIS — R040 Epistaxis: Secondary | ICD-10-CM | POA: Diagnosis not present

## 2022-06-23 DIAGNOSIS — Z79899 Other long term (current) drug therapy: Secondary | ICD-10-CM | POA: Diagnosis not present

## 2022-06-23 DIAGNOSIS — Z7982 Long term (current) use of aspirin: Secondary | ICD-10-CM | POA: Diagnosis not present

## 2022-06-23 LAB — CBC
HCT: 36.7 % (ref 36.0–46.0)
Hemoglobin: 11.6 g/dL — ABNORMAL LOW (ref 12.0–15.0)
MCH: 24.7 pg — ABNORMAL LOW (ref 26.0–34.0)
MCHC: 31.6 g/dL (ref 30.0–36.0)
MCV: 78.3 fL — ABNORMAL LOW (ref 80.0–100.0)
Platelets: 282 10*3/uL (ref 150–400)
RBC: 4.69 MIL/uL (ref 3.87–5.11)
RDW: 17.7 % — ABNORMAL HIGH (ref 11.5–15.5)
WBC: 11.6 10*3/uL — ABNORMAL HIGH (ref 4.0–10.5)
nRBC: 0 % (ref 0.0–0.2)

## 2022-06-23 LAB — BASIC METABOLIC PANEL
Anion gap: 6 (ref 5–15)
BUN: 31 mg/dL — ABNORMAL HIGH (ref 6–20)
CO2: 26 mmol/L (ref 22–32)
Calcium: 9.6 mg/dL (ref 8.9–10.3)
Chloride: 103 mmol/L (ref 98–111)
Creatinine, Ser: 1.17 mg/dL — ABNORMAL HIGH (ref 0.44–1.00)
GFR, Estimated: 56 mL/min — ABNORMAL LOW (ref >60)
Glucose, Bld: 105 mg/dL — ABNORMAL HIGH (ref 70–99)
Potassium: 3.9 mmol/L (ref 3.5–5.1)
Sodium: 135 mmol/L (ref 135–145)

## 2022-06-23 LAB — LACTATE DEHYDROGENASE: LDH: 173 U/L (ref 98–192)

## 2022-06-23 LAB — PROTIME-INR
INR: 2.4 — ABNORMAL HIGH (ref 0.8–1.2)
Prothrombin Time: 25.8 seconds — ABNORMAL HIGH (ref 11.4–15.2)

## 2022-06-23 MED ORDER — WARFARIN SODIUM 4 MG PO TABS: 4.0000 mg | ORAL_TABLET | Freq: Every day | ORAL | Status: DC

## 2022-06-23 MED ORDER — WARFARIN SODIUM 2 MG PO TABS: 4.0000 mg | ORAL_TABLET | Freq: Every day | ORAL | 5 refills | Status: DC

## 2022-06-23 MED ORDER — CEPHALEXIN 500 MG PO CAPS: 500.0000 mg | ORAL_CAPSULE | Freq: Three times a day (TID) | ORAL | 0 refills | Status: AC

## 2022-06-23 NOTE — Progress Notes (Signed)
ANTICOAGULATION CONSULT NOTE - Follow Up Consult  Pharmacy Consult for warfarin  Indication:  LVAD  No Known Allergies  Patient Measurements: Height: '5\' 3"'$  (160 cm) Weight: 107.7 kg (237 lb 7 oz) (patient did not want to stand for weight.) IBW/kg (Calculated) : 52.4 Vital Signs: Temp: 97.4 F (36.3 C) (01/01 0811) Temp Source: Oral (01/01 0811) BP: 91/71 (01/01 0811) Pulse Rate: 80 (01/01 0811)  Labs: Recent Labs    06/22/22 0510 06/22/22 0744 06/22/22 1452 06/23/22 0011  HGB 12.0  --  11.2* 11.6*  HCT 38.5  --  35.1* 36.7  PLT 292  --  269 282  LABPROT 29.8*  --   --  25.8*  INR 2.9*  --   --  2.4*  CREATININE  --  1.05*  --  1.17*     Estimated Creatinine Clearance: 66.9 mL/min (A) (by C-G formula based on SCr of 1.17 mg/dL (H)).   Medical History: Past Medical History:  Diagnosis Date   Anemia    Automatic implantable cardioverter-defibrillator in situ    B12 deficiency    Cancer (HCC)    Chronic CHF (congestive heart failure) (HCC)    a. EF 15-20% b. RHC (09/2013) RA 14, RV 57/22, PA 64/36 (48), PCWP 18, FIck CO/CI 3.7/1.6, PVR 8.1 WU, PA sat 47%    Heart valve problem    History of heart attack    History of hypothyroidism    History of stomach ulcers    HLD (hyperlipidemia)    Hyperthyroidism    Hypotension    Hypothyroidism    Morbid obesity ([place])    Myocardial infarction ([place]) 08/2013   Nocturnal dyspnea    Nonischemic cardiomyopathy (HCC)    Palpitations    Sinus tachycardia    Sleep apnea    Snoring-prob OSA 09/04/2011   SOB (shortness of breath)    Sprint Fidelis ICD lead RECALL  6949    Stroke ([place]) 2021   some residual right-sided weakness   UARS (upper airway resistance syndrome) 09/04/2011   HST 12/2013:  AHI 4/hr (numerous episodes of airflow reduction that did not have concomitant desaturation)    Vitamin D deficiency     Assessment: 55yof patient with Hx HF s/p LVAD HM3 implant 3/23 on warfarin regimen PTA is '6mg'$  Tues/Thurs/Sat  and '4mg'$  all other days.  Here with epistaxis s/p packing with ENT - keflex '500mg'$  bid while packing in place - to follow up as outpt. INR 2.9 on admit > fell to 2.4 after holding dose last pm Plan dc today will decrease her dose for now and re-assess Thur for INR check  Hx CVA - lifelong basa - hold for now restart on wed     Goal of Therapy:  INR 2-2.5  Monitor platelets by anticoagulation protocol: Yes   Plan:  Decrease warfarin '4mg'$  daily until INR check thur  Restart basa on wed  Continue keflex '500mg'$  tid while nasal packing in place   [employer].D. CPP, BCPS Clinical Pharmacist [phone] 06/23/2022 8:39 AM

## 2022-06-23 NOTE — Progress Notes (Addendum)
Advanced Heart Failure Rounding Note  PCP-Cardiologist: [name], MD   Subjective:    No complaints this morning, no further significant bleeding with nasal packing in place.  Hgb stable at 11.6.   LVAD Interrogation HM 3: Speed: 5900 Flow: 5.3 PI: 3.3 Power: 5.    Objective:   Weight Range: 107.7 kg Body mass index is 42.06 kg/m.   Vital Signs:   Temp:  [97.4 F (36.3 C)-98.3 F (36.8 C)] 97.4 F (36.3 C) (01/01 0811) Pulse Rate:  [48-92] 80 (01/01 0811) Resp:  [14-18] 17 (01/01 0811) BP: (87-124)/(47-100) 91/71 (01/01 0811) SpO2:  [98 %-100 %] 98 % (12/31 1941) Weight:  [107.7 kg] 107.7 kg (01/01 0334)    Weight change: Filed Weights   06/23/22 0334  Weight: 107.7 kg    Intake/Output:   Intake/Output Summary (Last 24 hours) at 06/23/2022 0825 Last data filed at 06/23/2022 0815 Gross per 24 hour  Intake 480 ml  Output --  Net 480 ml      Physical Exam    General: Well appearing this am. NAD.  HEENT: Nasal packing in place. Neck: Supple, JVP 7-8 cm. Carotids OK.  Cardiac:  Mechanical heart sounds with LVAD hum present.  Lungs:  CTAB, normal effort.  Abdomen:  NT, ND, no HSM. No bruits or masses. +BS  LVAD exit site: Well-healed and incorporated. Dressing dry and intact. No erythema or drainage. Stabilization device present and accurately applied. Driveline dressing changed daily per sterile technique. Extremities:  Warm and dry. No cyanosis, clubbing, rash, or edema.  Neuro:  Alert & oriented x 3. Cranial nerves grossly intact. Moves all 4 extremities w/o difficulty. Affect pleasant     Telemetry   NSR (personally reviewed)  Labs    CBC Recent Labs    06/22/22 1452 06/23/22 0011  WBC 11.7* 11.6*  HGB 11.2* 11.6*  HCT 35.1* 36.7  MCV 78.0* 78.3*  PLT 269 850   Basic Metabolic Panel Recent Labs    06/22/22 0744 06/23/22 0011  NA 137 135  K 4.6 3.9  CL 109 103  CO2 22 26  GLUCOSE 110* 105*  BUN 40* 31*  CREATININE 1.05* 1.17*   CALCIUM 9.2 9.6   Liver Function Tests No results for input(s): "AST", "ALT", "ALKPHOS", "BILITOT", "PROT", "ALBUMIN" in the last 72 hours. No results for input(s): "LIPASE", "AMYLASE" in the last 72 hours. Cardiac Enzymes No results for input(s): "CKTOTAL", "CKMB", "CKMBINDEX", "TROPONINI" in the last 72 hours.  BNP: BNP (last 3 results) Recent Labs    09/04/21 0349 09/10/21 0039 09/17/21 0140  BNP 358.8* 521.3* 279.4*    ProBNP (last 3 results) No results for input(s): "PROBNP" in the last 8760 hours.   D-Dimer No results for input(s): "DDIMER" in the last 72 hours. Hemoglobin A1C No results for input(s): "HGBA1C" in the last 72 hours. Fasting Lipid Panel No results for input(s): "CHOL", "HDL", "LDLCALC", "TRIG", "CHOLHDL", "LDLDIRECT" in the last 72 hours. Thyroid Function Tests No results for input(s): "TSH", "T4TOTAL", "T3FREE", "THYROIDAB" in the last 72 hours.  Invalid input(s): "FREET3"  Other results:   Imaging    No results found.   Medications:     Scheduled Medications:  cephALEXin  500 mg Oral TID   dapagliflozin propanediol  10 mg Oral QAC breakfast   methimazole  10 mg Oral Daily   potassium chloride SA  60 mEq Oral Daily   predniSONE  5 mg Oral Q breakfast   quiniDINE gluconate  324 mg  Oral BID   rosuvastatin  40 mg Oral Daily   sildenafil  20 mg Oral TID   spironolactone  25 mg Oral Daily   torsemide  20 mg Oral Daily   [START ON 06/25/2022] Vitamin D (Ergocalciferol)  50,000 Units Oral Q7 days   Warfarin - Pharmacist Dosing Inpatient   Does not apply q1600    Infusions:   PRN Medications: acetaminophen    Assessment/Plan   1. Epistaxis: Right posterior epistaxis, s/p packing by ENT.  Hgb 11.6, INR 2.4.  - Hold ASA until Wednesday then restart 81 mg daily.  - Maintain INR in 2-2.5 range (warfarin held yesterday with INR 2.9).  - Continue Keflex while packing is in place.  - She can go home today.  Will need followup in 5 days  in Dr. Redmond Baseman' office to remove pacing.   2. Chronic systolic HF: Nonischemic cardiomyopathy.  She has a Medtronic ICD. Cause for cardiomyopathy is uncertain, initially identified peri-partum (after son's birth), so cannot rule out peri-partum CMP.  On and off, she has had frequent PVCs.   RHC in 8/15 showed normal filling pressures and preserved cardiac output. Echo in 4/22 with EF 25%, severe LV dilation.  CPX (8/22) with mild-moderate HF limitation.  Echo in 3/23 with EF < 20% with severe dilation, normal RV size with mildly decreased systolic function, moderate-severe functional MR with dilated IVC. 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 Impella 5.5 placed 08/29/21.  Patient had HM3 LVAD placed 09/04/21 and speed adjusted by ramp echo up to 5800 rpm and again up to 5900 rpm. LVAD interrogation shows stable parameters today except for frequent PI events.  She is not volume overloaded, NYHA class II symptoms.   - Continue home torsemide and [place].  - Continue spironolactone 25 mg daily.   - Hold ASA until Wednesday as above.  - Continue sildenafil 20 mg tid  - Warfarin for VAD, goal INR 2-2.5.   3. 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  She had been on Levoxyl at home prior to 3/23 admission, noted to be hyperthyroid in 3/23.  This likely contributes to her CHF. She had recurrent VT and poorly tolerated AF which made it difficult for [name] to stop amiodarone. Initially treated w/ IV Solumedrol  -> transitioned to prednisone and methimazole.  Endocrinology following, prednisone being titrated down (now 5 mg daily).  She is off amiodarone. Last TSH was normal.  - Management per endocrinology 4. PVCs/VT: EP consulted given complicated situation with hyperthyroidism and VT.  - Continue quinidine.  - Now off amiodarone.   5. CVA: CVA in 10/21, had thrombectomy with stent placement.  No LV thrombus noted and no atrial fibrillation noted.  - Was on ASA 81 for this, restart Wednesday.  6. Anemia: Hgb stable at 11.6.    7. CKD stage 3: creatinine 1.17 today.  8. Mitral regurgitation: Moderate-severe functional MR pre-LVAD.  Unlikely to be Mitraclip candidate with severely dilated LV (>7 cm). Mitral regurgitation was mild on 8/23 echo.  9. Left ovarian mass: Hysterectomy + BSO.  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   10. Atrial fibrillation: Tolerates poorly.  NSR today.  - Watch closely off amiodarone.   She can go home today.  Will need to call Dr. Redmond Baseman' office tomorrow for followup  with ENT in 5 days (leave number on discharge paperwork).  She will hold aspirin until Wednesday then restart.  Coumadin dosing per pharmacy.  She will continue Keflex until the nasal packing is removed by ENT.  Other meds will not change.  Followup in LVAD clinic as scheduled.  Length of Stay: 0  [name], MD  06/23/2022, 8:25 AM  Advanced Heart Failure Team Pager [phone] (M-F; 7a - 5p)  Please contact [facility] for night-coverage after hours (5p -7a ) and weekends on [email or web address]

## 2022-06-23 NOTE — Care Management Obs Status (Signed)
MEDICARE OBSERVATION STATUS NOTIFICATION   Patient Details  Name: [name] MRN: [id number] Date of Birth: [birth year]   Medicare Observation Status Notification Given:  Yes    Zenon Mayo, RN 06/23/2022, 10:41 AM

## 2022-06-23 NOTE — TOC Transition Note (Signed)
Transition of Care [facility]) - CM/SW Discharge Note   Patient Details  Name: [name] MRN: [id number] Date of Birth: 07-11-1970  [facility] CM/SW Contact:  Zenon Mayo, RN Phone Number: 06/23/2022, 11:43 AM   Clinical Narrative:    Patient is for dc today, her spouse is at the bedside to transport her home.  She states she has a PCP with [facility] and she has an apt on the 12th.   She has no needs.          Patient Goals and CMS Choice      Discharge Placement                         Discharge Plan and Services Additional resources added to the After Visit Summary for                                       Social Determinants of Health (SDOH) Interventions SDOH Screenings   Food Insecurity: No Food Insecurity (08/28/2021)  Housing: Low Risk  (08/28/2021)  Transportation Needs: No Transportation Needs (08/28/2021)  Depression (PHQ2-9): Low Risk  (04/09/2022)  Financial Resource Strain: Low Risk  (08/28/2021)  Tobacco Use: Low Risk  (06/22/2022)     Readmission Risk Interventions    02/06/2022   12:34 PM 09/17/2021   10:03 AM 07/19/2021    3:49 PM  Readmission Risk Prevention Plan  Transportation Screening Complete Complete Complete  PCP or Specialist Appt within 5-7 Days   Complete  PCP or Specialist Appt within 3-5 Days Complete    Home Care Screening   Complete  Medication Review (RN CM)   Complete  HRI or [place] Complete    Social Work Consult for [place] Planning/Counseling Complete    Palliative Care Screening Not Applicable    Medication Review Press photographer) Referral to Pharmacy    PCP or Specialist appointment within 3-5 days of discharge  Complete   HRI or [place]  Complete   SW Recovery Care/Counseling Consult  Complete   [place]  Not Applicable

## 2022-06-23 NOTE — Discharge Summary (Signed)
Discharge Summary    Patient ID: [name] MRN: [id number]; DOB: 20-Oct-1970  Admit date: 06/22/2022 Discharge date: 06/23/2022  PCP:  Patient, No Pcp Per   [place] Providers Cardiologist:  [name], MD  Electrophysiologist:  Virl Axe, MD  Sleep Medicine:  Fransico Him, MD    Discharge Diagnoses    Principal Problem:   Epistaxis Active Problems:   NICM (nonischemic cardiomyopathy) ([place])   LVAD (left ventricular assist device) present [facility])   Diagnostic Studies/Procedures    N/a  _____________   History of Present Illness     [name] is a 52 y.o. 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    Patient was admitted in 3/23 with recurrent CHF exacerbation with low cardiac output.  She was also noted to be hyperthyroidism but amiodarone had to be continued due to VT and poorly tolerated atrial fibrillation.  She required Impella 5.5 placement and eventual HM3 LVAD placement on 09/04/21. Post-op course complicated by atrial fibrillation, malnutrition, and deconditioning. She went to CIR prior to discharge.    Patient developed heavy vaginal bleeding in 7/23, she was seen in the ER and required 2 units PRBCs.  She was admitted in 8/23 and had hysterectomy + BSO, LVAD speed increased to 5900 rpm on ramp echo.  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     Patient was in her usual state of health the night prior to admission.  She had been doing, well, no significant dyspnea with her usual activities. She woke up the night prior to admission with severe epistaxis from right nostril that would not stop.  She came to the ER.  She was noted to have right posterior epistaxis that required ENT to come in and pack.  INR was 2.9, hgb 12. Keflex was started with packing and it was recommended that she come in the hospital for observation  overnight.    LVAD INTERROGATION:  HeartMate 3 LVAD:  Flow 5.3 liters/min, speed 5900, power 4.8, PI 4.2.  Many PI events.    Hospital Course     Epistaxis -- Right posterior epistaxis, s/p packing by ENT.  Hgb 11.6, INR 2.4 at DC -- Holding ASA until Wednesday then restart 81 mg daily -- Maintain INR in 2-2.5 range (warfarin held initially on admission with INR 2.9) -- Continue Keflex '500mg'$  TID while packing is in place -- will need to call Dr. Redmond Baseman office for follow up on 1/5 to hopefully have packing removed  Chronic systolic HF -- Nonischemic cardiomyopathy.  She has a Medtronic ICD. Cause for cardiomyopathy is uncertain, initially identified peri-partum (after son's birth), so cannot rule out peri-partum CMP.  On and off, she has had frequent PVCs.   RHC in 8/15 showed normal filling pressures and preserved cardiac output. Echo in 4/22 with EF 25%, severe LV dilation.  CPX (8/22) with mild-moderate HF limitation.  Echo in 3/23 with EF < 20% with severe dilation, normal RV size with mildly decreased systolic function, moderate-severe functional MR with dilated IVC. 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 Impella 5.5 placed 08/29/21.  Patient had HM3 LVAD placed 09/04/21 and speed adjusted by ramp echo up to 5800 rpm and again up to 5900 rpm. LVAD interrogation shows  stable parameters today except for frequent PI events.  She was not volume overloaded, NYHA class II symptoms.   -- Continue home torsemide and [place].  -- Continue spironolactone 25 mg daily.   -- As above, holding ASA until Wednesday 1/3 -- Continue sildenafil 20 mg tid  -- Warfarin for VAD per pharmD dosing, goal INR 2-2.5.    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  She had been on Levoxyl at home prior to 3/23 admission, noted to be  hyperthyroid in 3/23.  This likely contributes to her CHF. She had recurrent VT and poorly tolerated AF which made it difficult for [name] to stop amiodarone. Initially treated w/ IV Solumedrol  -> transitioned to prednisone and methimazole.  Endocrinology following, prednisone being titrated down (now 5 mg daily).  She is off amiodarone. Last TSH was normal.  -- Management per endocrinology  PVCs/VT -- EP consulted given complicated situation with hyperthyroidism and VT.  -- Continue quinidine.  -- Now off amiodarone.   CVA -- CVA in 10/21, had thrombectomy with stent placement.  No LV thrombus noted and no atrial fibrillation noted.  -- Was on ASA 81 for this, restart Wednesday.   Anemia -- Hgb stable at 11.6.     CKD stage 3 -- creatinine 1.17 at discharge  Mitral regurgitation -- Moderate-severe functional MR pre-LVAD.  Unlikely to be Mitraclip candidate with severely dilated LV (>7 cm). Mitral regurgitation was mild on 8/23 echo  Left ovarian mass -- Hysterectomy + BSO.  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 Remained in SR during admission -- Watch closely off amiodarone.   Patient was seen by  Dr. [name] and deemed stable for discharge home. Follow up messages sent to the clinic. New Medications sent to pharmacy of choice.  _____________  Discharge Vitals Blood pressure 91/71, pulse 80, temperature (!) 97.4 F (36.3 C), temperature source Oral, resp. rate 17, height '5\' 3"'$  (1.6 m), weight 107.7 kg, SpO2 98 %.  Filed Weights   06/23/22 0334  Weight: 107.7 kg    Labs & Radiologic Studies    CBC Recent Labs    06/22/22 1452 06/23/22 0011  WBC 11.7* 11.6*  HGB 11.2* 11.6*  HCT 35.1* 36.7  MCV 78.0* 78.3*  PLT 269 144   Basic Metabolic Panel Recent Labs    06/22/22 0744 06/23/22 0011  NA 137 135  K 4.6 3.9  CL 109 103  CO2 22 26  GLUCOSE 110* 105*  BUN 40* 31*  CREATININE 1.05*  1.17*  CALCIUM 9.2 9.6   Liver Function Tests No results for input(s): "AST", "ALT", "ALKPHOS", "BILITOT", "PROT", "ALBUMIN" in the last 72 hours. No results for input(s): "LIPASE", "AMYLASE" in the last 72 hours. High Sensitivity Troponin:   No results for input(s): "TROPONINIHS" in the last 720 hours.  BNP Invalid input(s): "POCBNP" D-Dimer No results for input(s): "DDIMER" in the last 72 hours. Hemoglobin A1C No results for input(s): "HGBA1C" in the last 72 hours. Fasting Lipid Panel No results for input(s): "CHOL", "HDL", "LDLCALC", "TRIG", "CHOLHDL", "LDLDIRECT" in the last 72 hours. Thyroid Function Tests No results for input(s): "TSH", "T4TOTAL", "T3FREE", "THYROIDAB" in the last 72 hours.  Invalid input(s): "FREET3" _____________  No results found. Disposition   Pt is being discharged home today in good condition.  Follow-up Plans & Appointments     Follow-up Information  Melida Quitter, MD. Schedule an appointment as soon as possible for a visit on 06/26/2022.   Specialty: Otolaryngology Contact information: [address] [place] [address] [phone]                  Discharge Medications   Allergies as of 06/23/2022   No Known Allergies      Medication List     TAKE these medications    aspirin EC 81 MG tablet Take 1 tablet (81 mg total) by mouth daily. Swallow whole. Notes to patient: Please hold you aspirin until Wednesday 1/3!!!!!   cephALEXin 500 MG capsule Commonly known as: KEFLEX Take 1 capsule (500 mg total) by mouth 3 (three) times daily for 5 days.   dapagliflozin propanediol 10 MG Tabs tablet Commonly known as: FARXIGA Take 1 tablet (10 mg total) by mouth daily before breakfast.   methimazole 10 MG tablet Commonly known as: TAPAZOLE Take 1 tablet (10 mg total) by mouth daily.   potassium chloride SA 20 MEQ tablet Commonly known as: KLOR-CON M Take 3 tablets (60 mEq total) by mouth daily.    predniSONE 5 MG tablet Commonly known as: DELTASONE Take 1 tablet (5 mg total) by mouth daily with breakfast.   quiniDINE gluconate 324 MG CR tablet TAKE 1 TABLET BY MOUTH TWICE A DAY   rosuvastatin 40 MG tablet Commonly known as: CRESTOR TAKE 1 TABLET BY MOUTH ONCE DAILY   sildenafil 20 MG tablet Commonly known as: REVATIO Take 1 tablet (20 mg total) by mouth 3 (three) times daily.   spironolactone 25 MG tablet Commonly known as: ALDACTONE Take 1 tablet (25 mg total) by mouth daily. Restart on 02/10/22.   torsemide 20 MG tablet Commonly known as: DEMADEX Take 1 tablet (20 mg total) by mouth daily.   Vitamin D (Ergocalciferol) 1.25 MG (50000 UNIT) Caps capsule Commonly known as: DRISDOL Take 1 capsule (50,000 Units total) by mouth every 7 (seven) days.   warfarin 2 MG tablet Commonly known as: COUMADIN Take as directed. If you are unsure how to take this medication, talk to your nurse or doctor. Original instructions: Take 2 tablets (4 mg total) by mouth daily at 4 PM. Take '4mg'$  daily  or as directed by LVAD Clinic. What changed:  how much to take how to take this when to take this additional instructions         Outstanding Labs/Studies   N/a   Duration of Discharge Encounter   Greater than 30 minutes including physician time.  Signed, Reino Bellis, NP 06/23/2022, 10:03 AM

## 2022-06-24 ENCOUNTER — Ambulatory Visit (INDEPENDENT_AMBULATORY_CARE_PROVIDER_SITE_OTHER): Payer: Medicare Other | Admitting: Family Medicine

## 2022-06-26 ENCOUNTER — Ambulatory Visit (HOSPITAL_COMMUNITY): Payer: Self-pay | Admitting: Pharmacist

## 2022-06-26 DIAGNOSIS — Z7901 Long term (current) use of anticoagulants: Secondary | ICD-10-CM | POA: Diagnosis not present

## 2022-06-26 DIAGNOSIS — R040 Epistaxis: Secondary | ICD-10-CM | POA: Diagnosis not present

## 2022-06-26 LAB — POCT INR: INR: 1.4 — AB (ref 2.0–3.0)

## 2022-06-30 ENCOUNTER — Telehealth (HOSPITAL_COMMUNITY): Payer: Self-pay | Admitting: *Deleted

## 2022-06-30 ENCOUNTER — Ambulatory Visit: Payer: BC Managed Care – PPO | Admitting: Family Medicine

## 2022-06-30 ENCOUNTER — Other Ambulatory Visit (HOSPITAL_COMMUNITY): Payer: Self-pay | Admitting: *Deleted

## 2022-06-30 DIAGNOSIS — Z95811 Presence of heart assist device: Secondary | ICD-10-CM

## 2022-06-30 DIAGNOSIS — I5022 Chronic systolic (congestive) heart failure: Secondary | ICD-10-CM

## 2022-06-30 MED ORDER — TORSEMIDE 20 MG PO TABS: 20.0000 mg | ORAL_TABLET | ORAL | 3 refills | Status: DC | PRN

## 2022-06-30 NOTE — Telephone Encounter (Signed)
Received call from pt reporting continuous lightheadedness and dizziness. Denies falls. Denies bleeding. States she spent the day in bed yesterday due to feeling "off." Reports pump speed frequently ramping down to low speed limit and holding there for a period of time. VAD #: Speed- 5600 (fixed speed 5900) Flow 5.0 PI 7.0 Power 5.1 w. Currently taking Farxiga 10 mg daily and Torsemide 20 mg daily.   Discussed with Dr [name]. Order received for pt to [facility] and Torsemide x 2 days. May restart Farxiga on Wednesday, and Torsemide will be PRN. He wants to see pt in clinic this week.   Spoke with Satrina about the above medications changes. Encouraged hydration. She verbalized understanding. Clinic appt scheduled 07/03/22 at 10:00.   [name] RN [place] Coordinator  Office: [phone]  24/7 Pager: [phone]

## 2022-07-02 ENCOUNTER — Ambulatory Visit (INDEPENDENT_AMBULATORY_CARE_PROVIDER_SITE_OTHER): Payer: Medicare Other | Admitting: Family Medicine

## 2022-07-02 ENCOUNTER — Encounter (INDEPENDENT_AMBULATORY_CARE_PROVIDER_SITE_OTHER): Payer: Self-pay | Admitting: Family Medicine

## 2022-07-02 ENCOUNTER — Other Ambulatory Visit (HOSPITAL_COMMUNITY): Payer: Self-pay | Admitting: *Deleted

## 2022-07-02 VITALS — HR 81 | Temp 97.9°F | Ht 63.0 in | Wt 236.0 lb

## 2022-07-02 DIAGNOSIS — Z7901 Long term (current) use of anticoagulants: Secondary | ICD-10-CM

## 2022-07-02 DIAGNOSIS — E88819 Insulin resistance, unspecified: Secondary | ICD-10-CM | POA: Diagnosis not present

## 2022-07-02 DIAGNOSIS — E0590 Thyrotoxicosis, unspecified without thyrotoxic crisis or storm: Secondary | ICD-10-CM | POA: Diagnosis not present

## 2022-07-02 DIAGNOSIS — E669 Obesity, unspecified: Secondary | ICD-10-CM | POA: Diagnosis not present

## 2022-07-02 DIAGNOSIS — Z6841 Body Mass Index (BMI) 40.0 and over, adult: Secondary | ICD-10-CM

## 2022-07-02 DIAGNOSIS — E559 Vitamin D deficiency, unspecified: Secondary | ICD-10-CM | POA: Diagnosis not present

## 2022-07-02 DIAGNOSIS — Z95811 Presence of heart assist device: Secondary | ICD-10-CM

## 2022-07-03 ENCOUNTER — Ambulatory Visit (HOSPITAL_COMMUNITY)
Admission: RE | Admit: 2022-07-03 | Discharge: 2022-07-03 | Disposition: A | Discharge status: Home / Self Care | Payer: Medicare Other | Source: Ambulatory Visit | Attending: Cardiology | Admitting: Cardiology

## 2022-07-03 ENCOUNTER — Encounter (HOSPITAL_COMMUNITY): Payer: Self-pay | Admitting: Cardiology

## 2022-07-03 ENCOUNTER — Ambulatory Visit (HOSPITAL_COMMUNITY): Payer: Self-pay | Admitting: Pharmacist

## 2022-07-03 VITALS — BP 110/0 | HR 58 | Wt 241.4 lb

## 2022-07-03 DIAGNOSIS — I5022 Chronic systolic (congestive) heart failure: Secondary | ICD-10-CM | POA: Diagnosis not present

## 2022-07-03 DIAGNOSIS — I130 Hypertensive heart and chronic kidney disease with heart failure and stage 1 through stage 4 chronic kidney disease, or unspecified chronic kidney disease: Secondary | ICD-10-CM | POA: Diagnosis not present

## 2022-07-03 DIAGNOSIS — I081 Rheumatic disorders of both mitral and tricuspid valves: Secondary | ICD-10-CM | POA: Diagnosis not present

## 2022-07-03 DIAGNOSIS — Z7982 Long term (current) use of aspirin: Secondary | ICD-10-CM | POA: Diagnosis not present

## 2022-07-03 DIAGNOSIS — Z7901 Long term (current) use of anticoagulants: Secondary | ICD-10-CM | POA: Diagnosis not present

## 2022-07-03 DIAGNOSIS — E0590 Thyrotoxicosis, unspecified without thyrotoxic crisis or storm: Secondary | ICD-10-CM | POA: Insufficient documentation

## 2022-07-03 DIAGNOSIS — Z95811 Presence of heart assist device: Secondary | ICD-10-CM | POA: Diagnosis not present

## 2022-07-03 DIAGNOSIS — N183 Chronic kidney disease, stage 3 unspecified: Secondary | ICD-10-CM | POA: Diagnosis not present

## 2022-07-03 DIAGNOSIS — Z8543 Personal history of malignant neoplasm of ovary: Secondary | ICD-10-CM | POA: Insufficient documentation

## 2022-07-03 DIAGNOSIS — I493 Ventricular premature depolarization: Secondary | ICD-10-CM | POA: Diagnosis not present

## 2022-07-03 DIAGNOSIS — Z6841 Body Mass Index (BMI) 40.0 and over, adult: Secondary | ICD-10-CM | POA: Diagnosis not present

## 2022-07-03 DIAGNOSIS — I428 Other cardiomyopathies: Secondary | ICD-10-CM | POA: Insufficient documentation

## 2022-07-03 DIAGNOSIS — Z8673 Personal history of transient ischemic attack (TIA), and cerebral infarction without residual deficits: Secondary | ICD-10-CM | POA: Diagnosis not present

## 2022-07-03 DIAGNOSIS — I2721 Secondary pulmonary arterial hypertension: Secondary | ICD-10-CM | POA: Insufficient documentation

## 2022-07-03 DIAGNOSIS — Z95810 Presence of automatic (implantable) cardiac defibrillator: Secondary | ICD-10-CM | POA: Insufficient documentation

## 2022-07-03 DIAGNOSIS — Z79899 Other long term (current) drug therapy: Secondary | ICD-10-CM | POA: Insufficient documentation

## 2022-07-03 LAB — PROTIME-INR
INR: 1.9 — ABNORMAL HIGH (ref 0.8–1.2)
Prothrombin Time: 21.7 seconds — ABNORMAL HIGH (ref 11.4–15.2)

## 2022-07-03 LAB — BASIC METABOLIC PANEL
Anion gap: 10 (ref 5–15)
BUN: 20 mg/dL (ref 6–20)
CO2: 22 mmol/L (ref 22–32)
Calcium: 9.3 mg/dL (ref 8.9–10.3)
Chloride: 107 mmol/L (ref 98–111)
Creatinine, Ser: 1.35 mg/dL — ABNORMAL HIGH (ref 0.44–1.00)
GFR, Estimated: 48 mL/min — ABNORMAL LOW (ref >60)
Glucose, Bld: 83 mg/dL (ref 70–99)
Potassium: 3.5 mmol/L (ref 3.5–5.1)
Sodium: 139 mmol/L (ref 135–145)

## 2022-07-03 LAB — CBC
HCT: 36.2 % (ref 36.0–46.0)
Hemoglobin: 10.8 g/dL — ABNORMAL LOW (ref 12.0–15.0)
MCH: 24.3 pg — ABNORMAL LOW (ref 26.0–34.0)
MCHC: 29.8 g/dL — ABNORMAL LOW (ref 30.0–36.0)
MCV: 81.3 fL (ref 80.0–100.0)
Platelets: 309 10*3/uL (ref 150–400)
RBC: 4.45 MIL/uL (ref 3.87–5.11)
RDW: 17.1 % — ABNORMAL HIGH (ref 11.5–15.5)
WBC: 8 10*3/uL (ref 4.0–10.5)
nRBC: 0 % (ref 0.0–0.2)

## 2022-07-03 LAB — T4, FREE: Free T4: 0.9 ng/dL (ref 0.61–1.12)

## 2022-07-03 LAB — TSH: TSH: 7.097 u[IU]/mL — ABNORMAL HIGH (ref 0.350–4.500)

## 2022-07-03 LAB — LACTATE DEHYDROGENASE: LDH: 210 U/L — ABNORMAL HIGH (ref 98–192)

## 2022-07-03 NOTE — Progress Notes (Addendum)
Patient presented for a sick visit in [facility] today alone. Reports no problems with VAD equipment or concerns with drive line.  Pt called th [facility] on Monday reporting constant lightheadedness and dizziness. Denied falls or bleeding. She noted her speed was ramping down to her low speed and holding for period of time. Dr. [name] ordered for patient to Jolyn Nap and Torsemide for 2 days and then resume Farxiga 1/10.   Pt states that she is feeling much better since making these changes. Denies lightheadedness, dizziness, falls, shortness of breath, and signs of bleeding. She confirms restarting Farxiga yesterday. She endorses good hydration. VAD interrogated last suction event noted yesterday. Dr. [name] ordered for patient to only take Torsemide as needed and continue [place] daily.  Pt was recently admitted for epistaxis in the right nare. Packing was placed in the emergency room and removed 1/4. Pt states she has had no residual bleeding since packing was removed and has been using a humidifier and saline spray as instructed by Dr.Bates.  Pt requested to have Thyroid Panel drawn today and faxed to [facility] as she has follow up scheduled in March and they are requesting labs.  Pt is participating in healthy weight and wellness program and has been able to increase her exercise routine without any limitation.  Vital Signs:  Doppler Pressure: 110  Automatc BP: 92/46 (64) HR: 58 SR SPO2: UTO   Weight: 241.4 lbs w/o eqt Last weight: 235.4 lb w/o eqt Last home weight: 236  VAD Indication: Destination Therapy d/t BMI   VAD interrogation & Equipment Management: Speed: 5900 Flow: 5.2 Power: 4.8 w    PI: 2.9   Alarms: none Events: 10-20 daily  Fixed speed 5900 Low speed limit: 5600   Primary Controller:  Replace back up battery in 21 months. Back up controller:   Replace back up battery in 26 months.    Annual Equipment Maintenance on UBC/PM was performed on  09/03/21.    I reviewed the LVAD parameters from today and compared the results to the patient's prior recorded data. LVAD interrogation was NEGATIVE for significant power changes, NEGATIVE for clinical alarms and STABLE for PI events/speed drops. No programming changes were made and pump is functioning within specified parameters. Pt is performing daily controller and system monitor self tests along with completing weekly and monthly maintenance for LVAD equipment.   LVAD equipment check completed and is in good working order. Back-up equipment present. Charged back up battery and performed self-test on equipment.    Exit Site Care: Drive line is being maintained weekly by her husband [name]. Dressing CDI. Pt has been using the cath grip anchors and states these have improved her skin irritation. Pt given 8 daily dressing kits and 8 anchors for at home use.  Significant Events on VAD Support:    Device: Medtronic Therapies: on VF 240 VT 200 VVI: 40 Last check: 05/06/2022   BP & Labs:  Doppler 110 - reflecting modified systolic   Hgb 79.8 - No S/S of bleeding. Specifically denies vaginal bleeding, melena/BRBPR or nosebleeds.    LDH stable at 210 with established baseline of 250 - 450. Denies tea-colored urine. No power elevations noted on interrogation.    Plan:  Only take Torsemide as needed  Continue Farxiga 10 mg daily Thyroid panel results forwarded to [facility] for management Coumadin dosing per [name] Return to clinic in 2 months  Bobbye Morton RN,BSN [place] Coordinator  Office: [phone]  24/7 Pager: [phone]  ID:  [name], DOB 1970/11/11, MRN [id number]    Provider location: [place] [facility] Type of Visit: Established patient    PCP:  [name], MD    Cardiologist:  [name], MD  History of Present Illness: [name] is a 52 y.o. 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  She was followed for systolic CHF initially in [place]. Apparently her EF recovered to 35-40%. However, she was admitted to [facility] 09/19/13 for worsening dyspnea and CP. Coronaries were reportedly OK on LHC at that time at [facility]. Echo showed EF of 15-20% with four-chamber enlargement with moderate-severe TR/MR. RHC as below. Rheumatological w/u negative.  CPX in 9/16 showed near-normal functional capacity.  Repeat echo in 6/17 shows that EF remains 20%. Repeat CPX in 1/18 was submaximal but probably only mild circulatory limitation.  Echo 8/18 showed EF 25-30%, severe LV dilation.  CPX in 6/19 showed low normal functional capacity.  Echo in 10/19 showed EF 20-25%, moderate LV dilation.    Zio patch in 2/20 with 11% PVCs and NSVT, amiodarone started. Zio patch in 3/20 with 13.3% PVCs and NSVT.  She saw Dr. [name], he thought that increased PVCs were electromechanical in nature due to worsening heart failure.  Repeat Zio patch on amiodarone in 1/21 showed 6.2% PVCs.   She was admitted in 4/20 with VT in setting of hypokalemia.    CPX in 9/20 showed mild functional limitation, more due to body habitus than heart failure.  Echo in 4/21 showed EF 20% with severe LV dilation, diffuse hypokinesis, normal RV size and systolic function.   In 8/21, she had a shock for VF in the setting of hypokalemia.   Patient was admitted in 10/21 with CVA involving left MCA territory.  This was thought to be cardioembolic but LV thrombus was not visualized and no atrial fibrillation has been seen.  Echo in 10/21 showed EF 20-25%, severe LV dilation (no LV thrombus seen), mildly decreased RV systolic function, mild-moderate MR. She was taken by IR for thrombectomy and stent placement, now on ASA 81 and ticagrelor.   She had recurrent VT in 2/22, quinidine was added by EP.  Spironolactone and Coreg were decreased due to low BP and lightheadedness.     Echo in 4/22 showed EF 25% with severe LV dilation and global hypokinesis, normal RV, mild-moderate MR.   CPX (8/22) showed peak VO2 14.3, VE/VCO2 slope 42, RER 1.09 => mild to moderate HF limitation, elevated VE/VCO2 slope is likely false from end exercise hyperventilation. Restrictive lung physiology from obesity.   Patient was admitted in 3/23 with recurrent CHF exacerbation with low cardiac output.  She was also noted to be hyperthyroidism but amiodarone had to be continued due to VT and poorly tolerated atrial fibrillation.  She required Impella 5.5 placement and eventual HM3 LVAD placement on 09/04/21.  Post-op course complicated by atrial fibrillation, malnutrition, and deconditioning.  She went to CIR prior to discharge.   Patient developed heavy vaginal bleeding in 7/23, she was seen in the ER and required 2 units PRBCs.  She was admitted in 8/23 and had hysterectomy + BSO, LVAD speed increased to 5900 rpm on ramp echo.  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    The patient was admitted overnight in 12/23 with severe posterior epistaxis requiring nasal pacing.  She returns for followup of CHF and LVAD.  She has had no further epistaxis. She had an episode of severe lightheadedness several days ago associated with many PI events and suction events.  She was thought to be dehydrated/hypovolemic.  Torsemide was stopped and Wilder Glade was held for 2 days then restarted.  She is back on Farxiga.  She feels much better, no lightheadedness.  No significant exertional dyspnea. She continues to exercise regularly.   LVAD interrogation: Please see LVAD nursing note above.   Labs: (11/09/13): Dig level 0.5, K 3.6, Creatinine 0.83, pro-BNP 622 (12/19/13): K 3.6, creatinine 0.8 (7/15): K 4.1, creatinine 0.91 (8/15): K 3.9, creatinine 0.91 (10/15): K 4, creatinine 0.93, proBNP 129 (12/15): K 4, creatinine 0.9, digoxin 0.4 (5/16): K 4, creatinine 0.86 (9/16): K  3.6, creatinine 0.86, digoxin < 0.2 (5/17): K 4.1, creatinine 0.95, digoxin 0.3 (5/18): K 3.4, creatinine 0.95, TSH elevated but free T3 and free T4 normal.  (8/18): K 3.9, creatinine 0.87, BNP 108  (11/18): K 3.5, creatinine 1.04, digoxin 0.4 (1/19): LDL 91, HDL 47 (3/19): digoxin 0.7, K 4, creatinine 0.99 (4/19): TSH normal, LDL 95 (9/19): LDL 99, HDL 45, K 4, creatinine 0.96 (10/19): K 3.6, creatinine 0.85, digoxin 0.5 (2/20): LDL 103 (3/20): K 3.9, creatinine 1.05 (4/20): K 3.9, creatinine 1.23 => 1.15, TSH 8, digoxin 0.3, LFTs normal 6/20): K 4.3, creatinine 1.07, LFTs normal, digoxin 0.5 (7/20): K 3.8, creatinine 1.16 (8/20): digoxin < 0.2, LDL 135 (1/21): K 3.9, creatinine 1.05, LDL 171  (4/21): K 4.2, creatinine 0.95, LDL 127, TSH normal, LFTs normal (10/21): K 2.8, creatinine 1.21, LDL 130 (11/21): K 4.5, creatinine 1.3 => 1.2, LFTs normal, digoxin 1.3, TSH normal (3/22): K 4.1, creatinine 1.36 (4/22): hgb 10.8 (7/22): Transferrin 8%, digoxin 0.9, hgb 10.1, K 3.9, creatinine 1.42 (8/22): digoxin 0.4 (9/22): K 4.2, creatinine 1.32 (4/23): hgb 11, WBCs 13, K 3.9 => 3.8, creatinine 0.9 => 0.98, albumin 3.2, ALT 45, AST 31, LDH 272 => 244, TSH 0.235, free T4 1.84 (7/23): hgb 8, TSH normal 8/23): K 3.8, creatinine 1.15, hgb 11.4 (9/23): K 3.7, creatinine 1.36 (10/23): K 3.6, creatinine 1.42 (11/23): TSH normal (1/24): K 3.9, creatinine 1.17  PMH: 1. OSA: Using CPAP.  2. PVCs:  - Zio patch 2/20: 11% PVCs - Zio patch 3/20: 13.3% PVCs - Zio patch 1/21: 6.2% PVCs 3. VT/VF: 8/21, in setting of hypokalemia.  2/22 VT, quinidine added.  4. 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  No LV thrombus visualized and atrial fibrillation has not been visualized.  - IR thrombectomy with stent placement in 10/21.  5. Chronic systolic CHF: Nonischemic cardiomyopathy:   - Coronary angiography 2015 at [facility]: Normal coronaries.  - Echo (8/15) with EF 20%, moderate LV dilation, diffuse hypokinesis,  normal RV size with mildly decreased systolic function, mild MR.  - Echo (5/16) with EF 20%, spherical LV with diffuse hypokinesis, mild MR.  - Echo (6/17) with EF 20%, diffuse hypokinesis, moderate LV dilation, normal RV, mild to moderate MR.  - RHC (12/17): mean RA 5, PA 56/31, mean PCWP 24, CI 2, PVR 3.7 WU - Echo (8/18) with EF 25-30%, severe LV dilation, severe LAE.  - Echo (2/20) with EF 20-25%, moderate LV dilation, moderate MR.  - Echo (4/21) with EF 20-25%, severe LV dilation, mild-moderate MR, normal RV, PASP 26 mmHg.  - Echo (10/21) with EF 20-25%, severe LV dilation (no LV thrombus seen), mildly decreased RV systolic function, mild-moderate MR. - CPX in 9/20 showed  mild functional limitation, more due to body habitus than heart failure.  - CPX in 8/21: Peak VO2 11.6, VE/VCO2 32, RER 1.2.  Mild HF limitation.  - Echo (4/22): EF 25% with severe LV dilation and global hypokinesis, normal RV, mild-moderate MR.  - CPX (8/22): peak VO2 14.3, VE/VCO2 slope 42, RER 1.09 => mild to moderate HF limitation, elevated VE/VCO2 slope is likely false from end exercise hyperventilation. Restrictive lung physiology from obesity.  - HM3 LVAD placement 09/04/21.  6. PFTs (1/22): Near normal.  7. Fe deficiency anemia 8. 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  10. Left ovarian mass -  hysterectomy + BSO.  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      Current Outpatient Medications  Medication Sig Dispense Refill   aspirin EC 81 MG tablet Take 1 tablet (81 mg total) by mouth daily. Swallow whole. 90 tablet 3   dapagliflozin propanediol (FARXIGA) 10 MG TABS tablet Take 1 tablet (10 mg total) by mouth daily before breakfast. 30 tablet 6   methimazole (TAPAZOLE) 10 MG tablet Take 1 tablet (10 mg total) by mouth daily. 90 tablet 1   potassium chloride SA (KLOR-CON M) 20 MEQ tablet Take 3 tablets (60 mEq total) by mouth  daily. 270 tablet 3   predniSONE (DELTASONE) 5 MG tablet Take 1 tablet (5 mg total) by mouth daily with breakfast. 30 tablet 6   quiniDINE gluconate 324 MG CR tablet TAKE 1 TABLET BY MOUTH TWICE A DAY 60 tablet 6   rosuvastatin (CRESTOR) 40 MG tablet TAKE 1 TABLET BY MOUTH ONCE DAILY 30 tablet 2   sildenafil (REVATIO) 20 MG tablet Take 1 tablet (20 mg total) by mouth 3 (three) times daily. 90 tablet 6   spironolactone (ALDACTONE) 25 MG tablet Take 1 tablet (25 mg total) by mouth daily. Restart on 02/10/22. 45 tablet 3   torsemide (DEMADEX) 20 MG tablet Take 1 tablet (20 mg total) by mouth as needed. Take 1 tablet (20 mg) by mouth as needed, or as directed by heart failure clinic. 180 tablet 3   Vitamin D, Ergocalciferol, (DRISDOL) 1.25 MG (50000 UNIT) CAPS capsule Take 1 capsule (50,000 Units total) by mouth every 7 (seven) days. 5 capsule 0   warfarin (COUMADIN) 2 MG tablet Take 2 tablets (4 mg total) by mouth daily at 4 PM. Take '4mg'$  daily  or as directed by LVAD Clinic. 180 tablet 5   No current facility-administered medications for this encounter.    Allergies:   Patient has no known allergies.   Social History:  The patient  reports that she has never smoked. She has never used smokeless tobacco. She reports that she does not drink alcohol and does not use drugs.   Family History:  The patient's family history includes Heart Problems in her mother; Heart disease in her maternal grandmother and mother; High blood pressure in her father; Hypertension in her father and mother; Obesity in her mother; Stroke in her father.   ROS:  Please see the history of present illness.   All other systems are personally reviewed and negative.   Exam:   BP (!) 110/0   Pulse (!) 58   Wt 109.5 kg (241 lb 6.4 oz)   BMI 42.76 kg/m  MAP 74 General: Well appearing this am. NAD.  HEENT: Normal. Neck: Supple, JVP 7 cm. Carotids OK.  Cardiac:  Mechanical heart sounds with LVAD hum present.  Lungs:  CTAB,  normal effort.  Abdomen:  NT, ND, no HSM. No bruits or masses. +BS  LVAD exit site: Well-healed and incorporated. Dressing dry and intact. No erythema or drainage. Stabilization device present and accurately applied. Driveline dressing changed daily per sterile technique. Extremities:  Warm and dry. No cyanosis, clubbing, rash, or edema.  Neuro:  Alert & oriented x 3. Cranial nerves grossly intact. Moves all 4 extremities w/o difficulty. Affect pleasant    Recent Labs: 09/17/2021: B Natriuretic Peptide 279.4 02/04/2022: Magnesium 1.9 03/27/2022: ALT 19 07/03/2022: BUN 20; Creatinine, Ser 1.35; Hemoglobin 10.8; Platelets 309; Potassium 3.5; Sodium 139; TSH 7.097  Personally reviewed   Wt Readings from Last 3 Encounters:  07/03/22 109.5 kg (241 lb 6.4 oz)  07/02/22 107 kg (236 lb)  06/23/22 107.7 kg (237 lb 7 oz)    ASSESSMENT AND PLAN:   1. Chronic systolic HF: Nonischemic cardiomyopathy.  She has a Medtronic ICD. Cause for cardiomyopathy is uncertain, initially identified peri-partum (after son's birth), so cannot rule out peri-partum CMP.  On and off, she has had frequent PVCs.   RHC in 8/15 showed normal filling pressures and preserved cardiac output. Echo in 4/22 with EF 25%, severe LV dilation.  CPX (8/22) with mild-moderate HF limitation.  Echo in 3/23 with EF < 20% with severe dilation, normal RV size with mildly decreased systolic function, moderate-severe functional MR with dilated IVC. 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 Impella 5.5 placed 08/29/21.  Patient had HM3 LVAD placed 09/04/21 and speed adjusted by ramp echo up to 5800 rpm and again up to 5900 rpm. LVAD interrogation shows stable parameters today.  NYHA class I-II, she is not volume overloaded on exam.  Torsemide stopped recently due to hypovolemia.   - She will stay off torsemide, can  use prn.  - Continue Farxiga 10 mg daily, BMET today.   - Continue spironolactone 25 mg daily.   - She should continue ASA 81 mg daily with history of CVA.  - Continue sildenafil 20 mg tid  - Warfarin for VAD, goal INR 2-2.5.   2. 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  She had been on Levoxyl at home prior to 3/23 admission, noted to be hyperthyroid in 3/23.  This likely contributes to her CHF. She had recurrent VT and poorly tolerated AF which made it difficult for [name] to stop amiodarone. Initially treated w/ IV Solumedrol  -> transitioned to prednisone and methimazole.  Endocrinology following, prednisone being titrated down.  She is off amiodarone. Last TSH was normal.  - Management per endocrinology 3. PVCs/VT: EP consulted given complicated situation with hyperthyroidism and VT.  - Continue quinidine.  - Now off amiodarone.  4. CVA: CVA in 10/21, had thrombectomy with stent placement.  No LV thrombus noted and no atrial fibrillation noted.  - Would continue ASA 81 daily.   5. Anemia: Stable hgb recently.  - CBC today.  6. CKD stage 3: Creatinine recently improved.   - BMET today.  7. Mitral regurgitation: Moderate-severe functional MR pre-LVAD.  Unlikely to be Mitraclip candidate with severely dilated LV (>7 cm). Mitral regurgitation was mild on 8/23 echo.  8. Left ovarian mass: Hysterectomy + BSO.  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   9. Atrial fibrillation: Tolerates poorly.   - Watch closely  off amiodarone.  10. Epistaxis: No recurrence.   Followup in 2 months.   [name], MD  07/03/2022  [facility] [address] [facility] [place] [address] [phone] (office) [phone] (fax)

## 2022-07-04 ENCOUNTER — Encounter: Payer: Self-pay | Admitting: Nurse Practitioner

## 2022-07-04 ENCOUNTER — Ambulatory Visit (INDEPENDENT_AMBULATORY_CARE_PROVIDER_SITE_OTHER): Payer: Medicare Other | Admitting: Nurse Practitioner

## 2022-07-04 VITALS — Ht 63.39 in | Wt 241.0 lb

## 2022-07-04 DIAGNOSIS — F410 Panic disorder [episodic paroxysmal anxiety] without agoraphobia: Secondary | ICD-10-CM

## 2022-07-04 DIAGNOSIS — D1801 Hemangioma of skin and subcutaneous tissue: Secondary | ICD-10-CM | POA: Diagnosis not present

## 2022-07-04 DIAGNOSIS — Z1211 Encounter for screening for malignant neoplasm of colon: Secondary | ICD-10-CM

## 2022-07-04 DIAGNOSIS — E785 Hyperlipidemia, unspecified: Secondary | ICD-10-CM

## 2022-07-04 DIAGNOSIS — Z6841 Body Mass Index (BMI) 40.0 and over, adult: Secondary | ICD-10-CM

## 2022-07-04 LAB — T3, FREE: T3, Free: 2.6 pg/mL (ref 2.0–4.4)

## 2022-07-04 MED ORDER — ALPRAZOLAM 0.25 MG PO TABS: 0.2500 mg | ORAL_TABLET | Freq: Two times a day (BID) | ORAL | 0 refills | Status: DC | PRN

## 2022-07-04 NOTE — Progress Notes (Signed)
Tomasita Morrow, NP-C Phone: [phone]  [name] is a 52 y.o. female who presents today to establish care. She has an extensive past medical history that is significant for chronic systolic heart failure due to nonischemic cardiomyopathy with LVAD placement. She is followed closely by Cardiology/VAD team, Endocrinology, and Weight Management. She is requesting management of her cholesterol.   She reports a small abnormal bump on her left inner thigh that she is concerned about. Denies pain. She reports it has not changed in size or shape.  She reports increasing anxiety and panic attacks after a recent hospitalization. She is also requesting a handicap placard.   Active Ambulatory Problems    Diagnosis Date Noted   Obesity 03/12/2009   NICM (nonischemic cardiomyopathy) ([place]) 72/02/4708   SYSTOLIC HEART FAILURE, CHRONIC 03/12/2009   Encounter for fitting or adjustment of implantable cardioverter-defibrillator (ICD)    UARS (upper airway resistance syndrome) 09/04/2011   [employer] ICD lead RECALL  6949    Pulmonary hypertension ([place]) 02/07/2014   Preoperative cardiovascular examination 10/25/2014   Hypotension 06/03/2016   Volume depletion 06/03/2016   Vitamin D deficiency 10/19/2017   Insulin resistance 10/19/2017   ICD (implantable cardioverter-defibrillator) discharge 09/27/2018   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   Middle cerebral artery embolism, 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   Hypothyroidism 04/26/2014   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   Acute CHF (congestive heart failure) ([place]) 07/18/2021   Decompensated heart failure ([place]) 08/26/2021   Malnutrition of moderate degree 08/30/2021   Arterial line in place    Debility 09/20/2021   LVAD (left ventricular assist device) present ([place])    Hypokalemia    Paroxysmal atrial fibrillation ([place])    Transaminitis    Adnexal mass 10/11/2021   Mass of uterine adnexa 02/03/2022   Abnormal uterine bleeding  (AUB)    OSA (obstructive sleep apnea) 02/17/2022   Other fatigue 04/09/2022   SOBOE (shortness of breath on exertion) 04/09/2022   Hyperthyroidism 04/09/2022   Postsurgical menopause 04/09/2022   Malnutrition ([place]) 04/09/2022   Depression screening 04/09/2022   Current chronic use of systemic steroids 05/27/2022   Epistaxis 06/22/2022   Panic attacks 07/04/2022   Morbid obesity with BMI of 40.0-44.9, adult ([place]) 07/04/2022   Hyperlipidemia 07/04/2022   Hemangioma of skin 07/04/2022   Resolved Ambulatory Problems    Diagnosis Date Noted   No Resolved Ambulatory Problems   Past Medical History:  Diagnosis Date   Anemia    Automatic implantable cardioverter-defibrillator in situ    B12 deficiency    Cancer ([place])    Chronic CHF (congestive heart failure) (H[facility])    Heart valve problem    History of heart attack    History of hypothyroidism    History of stomach ulcers    HLD (hyperlipidemia)    Morbid obesity ([place])    Myocardial infarction ([place]) 08/2013   Nocturnal dyspnea    Nonischemic cardiomyopathy (H[facility])    Palpitations    Sinus tachycardia    Sleep apnea    Snoring-prob OSA 09/04/2011   SOB (shortness of breath)    Stroke ([place]) 2021    Family History  Problem Relation Age of Onset   Hypertension Mother    Heart disease Mother    Obesity Mother    Heart Problems Mother    Hypertension Father    High blood pressure Father    Stroke Father    Heart disease Maternal Grandmother    Colon cancer Neg Hx    Breast cancer  Neg Hx    Ovarian cancer Neg Hx    Endometrial cancer Neg Hx    Pancreatic cancer Neg Hx    Prostate cancer Neg Hx     Social History   Socioeconomic History   Marital status: Married    Spouse name: Daisie Haft   Number of children: 2   Years of education: Not on file   Highest education level: Not on file  Occupational History   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  Tobacco Use   Smoking status: Never   Smokeless tobacco:  Never  Vaping Use   Vaping Use: Never used  Substance and Sexual Activity   Alcohol use: No   Drug use: No   Sexual activity: Not Currently  Other Topics Concern   Not on file  Social History Narrative   [place] sometimes   Right handed   Social Determinants of Health   Financial Resource Strain: Low Risk  (08/28/2021)   Overall Financial Resource Strain (CARDIA)    Difficulty of Paying Living Expenses: Not very hard  Food Insecurity: No Food Insecurity (08/28/2021)   Hunger Vital Sign    Worried About Running Out of Food in the Last Year: Never true    Ran Out of Food in the Last Year: Never true  Transportation Needs: No Transportation Needs (08/28/2021)   PRAPARE - Hydrologist (Medical): No    Lack of Transportation (Non-Medical): No  Physical Activity: Not on file  Stress: Not on file  Social Connections: Not on file  Intimate Partner Violence: Not on file    ROS  General:  Negative for unexplained weight loss, fever Skin: Negative for sore that won't heal HEENT: Negative for trouble hearing, trouble seeing, ringing in ears, mouth sores, hoarseness, change in voice, dysphagia. CV:  Negative for chest pain, dyspnea, edema, palpitations Resp: Negative for cough, dyspnea, hemoptysis GI: Negative for nausea, vomiting, diarrhea, constipation, abdominal pain, melena, hematochezia. GU: Negative for dysuria, incontinence, urinary hesitance, hematuria, vaginal or penile discharge, polyuria, sexual difficulty, lumps in testicle or breasts MSK: Negative for muscle cramps or aches, joint pain or swelling Neuro: Negative for headaches, weakness, numbness, dizziness, passing out/fainting Psych: Negative for depression, memory problems  Objective  Physical Exam Vitals:    BP Readings from Last 3 Encounters:  07/03/22 (!) 110/0  06/23/22 91/71  05/27/22 (!) 96/58   Wt Readings from Last 3 Encounters:  07/04/22 241 lb (109.3 kg)  07/03/22 241 lb 6.4 oz  (109.5 kg)  07/02/22 236 lb (107 kg)    Physical Exam Constitutional:      General: She is not in acute distress.    Appearance: Normal appearance.  HENT:     Head: Normocephalic.  Cardiovascular:     Comments: Mechanical heart sounds with LVAD present.  Pulmonary:     Effort: Pulmonary effort is normal. No respiratory distress.     Breath sounds: Normal breath sounds.  Skin:    General: Skin is warm and dry.     Findings: Lesion present.     Comments: Small dark blue papule noted on inner left thigh consistent with an angioma.   Neurological:     Mental Status: She is alert.  Psychiatric:        Mood and Affect: Mood normal.        Behavior: Behavior normal.    Assessment/Plan:   Panic attacks Assessment & Plan: Hx of panic attacks, treated with Xanax 0.25  TID PRN in the past. Having increased anxiety and panic attacks after recent hospitalization. Restart BID PRN. Will monitor. PDMP reviewed.   Orders: -     ALPRAZolam; Take 1 tablet (0.25 mg total) by mouth 2 (two) times daily as needed for anxiety.  Dispense: 20 tablet; Refill: 0  Hyperlipidemia, unspecified hyperlipidemia type Assessment & Plan: Chronic. Stable on Crestor '40mg'$  daily. Continue. Encouraged healthy diet and exercise.    Hemangioma of skin Assessment & Plan: Very small. Not bothersome to patient. Advised patient to monitor closely for bleeding or changes.   Morbid obesity with BMI of 40.0-44.9, adult [facility]) Assessment & Plan: Goal for patient to get BMI under 35 for heart transplant. Continue working with [employer] management services. Continue exercise, treadmill 3 x weekly and healthy diet.    Screen for colon cancer -     Cologuard   Return in about 6 months (around 01/02/2023).   Tomasita Morrow, NP-C [place]

## 2022-07-04 NOTE — Assessment & Plan Note (Signed)
Hx of panic attacks, treated with Xanax 0.25 TID PRN in the past. Having increased anxiety and panic attacks after recent hospitalization. Restart BID PRN. Will monitor. PDMP reviewed.

## 2022-07-04 NOTE — Progress Notes (Signed)
Patient advised and will make medication changes

## 2022-07-04 NOTE — Assessment & Plan Note (Signed)
Chronic. Stable on Crestor '40mg'$  daily. Continue. Encouraged healthy diet and exercise.

## 2022-07-04 NOTE — Assessment & Plan Note (Addendum)
Very small. Not bothersome to patient. Advised patient to monitor closely for bleeding or changes.

## 2022-07-04 NOTE — Assessment & Plan Note (Addendum)
Goal for patient to get BMI under 35 for heart transplant. Continue working with [employer] management services. Continue exercise, treadmill 3 x weekly and healthy diet.

## 2022-07-07 ENCOUNTER — Other Ambulatory Visit: Payer: BC Managed Care – PPO

## 2022-07-10 ENCOUNTER — Ambulatory Visit (HOSPITAL_COMMUNITY): Payer: Self-pay | Admitting: Pharmacist

## 2022-07-10 LAB — POCT INR: INR: 2.2 (ref 2.0–3.0)

## 2022-07-15 DIAGNOSIS — Z1211 Encounter for screening for malignant neoplasm of colon: Secondary | ICD-10-CM | POA: Diagnosis not present

## 2022-07-24 ENCOUNTER — Ambulatory Visit (HOSPITAL_COMMUNITY): Payer: Self-pay | Admitting: Pharmacist

## 2022-07-24 ENCOUNTER — Other Ambulatory Visit: Payer: Self-pay | Admitting: Nurse Practitioner

## 2022-07-24 DIAGNOSIS — R195 Other fecal abnormalities: Secondary | ICD-10-CM

## 2022-07-24 LAB — POCT INR: INR: 1.9 — AB (ref 2.0–3.0)

## 2022-07-24 LAB — COLOGUARD: COLOGUARD: POSITIVE — AB

## 2022-07-24 NOTE — Progress Notes (Signed)
Chief Complaint:   OBESITY [name] is here to discuss her progress with her obesity treatment plan along with follow-up of her obesity related diagnoses. [name] is on the Category 2 Plan and states she is following her eating plan approximately 80% of the time. [name] states she is walking 20-30 minutes 3 times per week.  Today's visit was #: 4 Starting weight: 243 lbs Starting date: 04/09/2022 Today's weight: 235 lbs Today's date: 07/02/2022 Total lbs lost to date: 8 lbs Total lbs lost since last in-office visit: +1 lb  Interim History: Patient went to the emergency department on 06/23/2022 for epistaxis and had a headache due to packing.  Patient is feeling better now.  Patient held diuretics for a week probably.  Follow-up with your  Has more abdominal distortion and was not hungry.  Patient is added 1-2 CeraLyte protein shakes daily.  Patient has not been walking as much.  Patient was drinking 57 ounces of water daily but diuretics has been holding.  Subjective:   1. Hyperthyroidism Thyroid lab 07/07/2022 scheduled.  Patient is taking prednisone 5 mg daily.  She is also on methimazole 10 mg daily.  Feeling more tired.  2. Vitamin D deficiency Vitamin D level 19.1 on 04/09/2022.  Patient is taking OTC vitamin D 5000 IU daily patient is on prescription vitamin D not covered by insurance.   3. Insulin resistance Fasting insulin 17.8 on 04/09/2022.  Avoid use of metformin with heart failure.  Actively working on prescribed diet.  Assessment/Plan:   1. Hyperthyroidism Look for weight reduction as she tapers off prednisone.  If she is currently in the hyperthyroid range this may be impacting her ability to lose weight, follow-up with Dr. Phoebe Perch.   2. Vitamin D deficiency Continue OTC vitamin D 5000 IU daily.  Recheck level next visit.  3. Insulin resistance Reduce intake of starches, sweets.  Increase walking time and consider semaglutide.  4. Obesity,current BMI 41.8 1.   Restart walking on treadmill 20 to 30 minutes 4 times per week. 2.  Aim for 80 g of protein intake daily.  [name] is currently in the action stage of change. As such, her goal is to continue with weight loss efforts. She has agreed to the Category 2 Plan.   Exercise goals: All adults should avoid inactivity. Some physical activity is better than none, and adults who participate in any amount of physical activity gain some health benefits.  Behavioral modification strategies: increasing lean protein intake and planning for success.  [name] has agreed to follow-up with our clinic in 4 weeks. She was informed of the importance of frequent follow-up visits to maximize her success with intensive lifestyle modifications for her multiple health conditions.   Objective:   Pulse 81, temperature 97.9 F (36.6 C), height '5\' 3"'$  (1.6 m), weight 236 lb (107 kg), SpO2 100 %. Body mass index is 41.81 kg/m.  General: Cooperative, alert, well developed, in no acute distress. HEENT: Conjunctivae and lids unremarkable. Cardiovascular: Regular rhythm.  Lungs: Normal work of breathing. Neurologic: No focal deficits.   Lab Results  Component Value Date   CREATININE 1.35 (H) 07/03/2022   BUN 20 07/03/2022   NA 139 07/03/2022   K 3.5 07/03/2022   CL 107 07/03/2022   CO2 22 07/03/2022   Lab Results  Component Value Date   ALT 19 03/27/2022   AST 23 03/27/2022   ALKPHOS 72 03/27/2022   BILITOT 0.5 03/27/2022   Lab Results  Component Value Date  HGBA1C 5.4 04/09/2022   HGBA1C 5.1 08/27/2021   HGBA1C 5.5 04/18/2020   HGBA1C 5.3 12/01/2019   HGBA1C 5.3 07/04/2019   Lab Results  Component Value Date   INSULIN 17.8 04/09/2022   INSULIN 16.6 12/01/2019   INSULIN 19.8 07/04/2019   INSULIN 26.5 (H) 01/25/2019   INSULIN 15.6 08/17/2018   Lab Results  Component Value Date   TSH 7.097 (H) 07/03/2022   Lab Results  Component Value Date   CHOL 171 04/09/2022   HDL 62 04/09/2022   LDLCALC 94  04/09/2022   TRIG 82 04/09/2022   CHOLHDL 2.8 04/09/2022   Lab Results  Component Value Date   VD25OH 19.1 (L) 04/09/2022   VD25OH 30.5 12/01/2019   VD25OH 14.4 (L) 07/04/2019   Lab Results  Component Value Date   WBC 8.0 07/03/2022   HGB 10.8 (L) 07/03/2022   HCT 36.2 07/03/2022   MCV 81.3 07/03/2022   PLT 309 07/03/2022   Lab Results  Component Value Date   IRON 18 (L) 12/03/2021   TIBC 351 12/03/2021   FERRITIN 17 12/03/2021   Attestation Statements:   Reviewed by clinician on day of visit: allergies, medications, problem list, medical history, surgical history, family history, social history, and previous encounter notes.  I, Davy Pique, am acting as Location manager for Loyal Gambler, DO.

## 2022-07-28 ENCOUNTER — Encounter (HOSPITAL_COMMUNITY): Payer: Medicare Other | Admitting: Cardiology

## 2022-07-29 NOTE — Progress Notes (Signed)
Chief Complaint:   OBESITY [name] is here to discuss her progress with her obesity treatment plan along with follow-up of her obesity related diagnoses. [name] is on the Category 2 Plan and states she is following her eating plan approximately 80% of the time. [name] states she is walking 20-30 minutes 3 times per week.  Today's visit was #: 4 Starting weight: 243 lbs Starting date: 04/09/2022 Today's weight: 235 lbs Today's date: 07/02/2022 Total lbs lost to date: 8 lbs Total lbs lost since last in-office visit: +1 lb  Interim History: Patient went to the emergency department on 06/23/2022 for epistaxis and had a headache due to packing.  Patient is feeling better now.  Patient held diuretics for a week probably.  Follow-up with your  Has more abdominal distortion and was not hungry.  Patient is added 1-2 CeraLyte protein shakes daily.  Patient has not been walking as much.  Patient was drinking 57 ounces of water daily but diuretics has been holding.  Subjective:   1. Hyperthyroidism Thyroid lab 07/07/2022 scheduled.  Patient is taking prednisone 5 mg daily.  She is also on methimazole 10 mg daily.  Feeling more tired.  2. Vitamin D deficiency Vitamin D level 19.1 on 04/09/2022.  Patient is taking OTC vitamin D 5000 IU daily patient is on prescription vitamin D not covered by insurance.   3. Insulin resistance Fasting insulin 17.8 on 04/09/2022.  Avoid use of metformin with heart failure.  Actively working on prescribed diet.  Assessment/Plan:   1. Hyperthyroidism Look for weight reduction as she tapers off prednisone.  If she is currently in the hyperthyroid range this may be impacting her ability to lose weight, follow-up with Dr. Phoebe Perch.   2. Vitamin D deficiency Continue OTC vitamin D 5000 IU daily.  Recheck level next visit.  3. Insulin resistance Reduce intake of starches, sweets.  Increase walking time and consider semaglutide.  4. Obesity,current BMI 41.8 1.   Restart walking on treadmill 20 to 30 minutes 4 times per week. 2.  Aim for 80 g of protein intake daily.  [name] is currently in the action stage of change. As such, her goal is to continue with weight loss efforts. She has agreed to the Category 2 Plan.   Exercise goals: All adults should avoid inactivity. Some physical activity is better than none, and adults who participate in any amount of physical activity gain some health benefits.  Behavioral modification strategies: increasing lean protein intake and planning for success.  [name] has agreed to follow-up with our clinic in 4 weeks. She was informed of the importance of frequent follow-up visits to maximize her success with intensive lifestyle modifications for her multiple health conditions.   Objective:   Pulse 81, temperature 97.9 F (36.6 C), height '5\' 3"'$  (1.6 m), weight 236 lb (107 kg), SpO2 100 %. Body mass index is 41.81 kg/m.  General: Cooperative, alert, well developed, in no acute distress. HEENT: Conjunctivae and lids unremarkable. Cardiovascular: Regular rhythm.  Lungs: Normal work of breathing. Neurologic: No focal deficits.   Lab Results  Component Value Date   CREATININE 1.35 (H) 07/03/2022   BUN 20 07/03/2022   NA 139 07/03/2022   K 3.5 07/03/2022   CL 107 07/03/2022   CO2 22 07/03/2022   Lab Results  Component Value Date   ALT 19 03/27/2022   AST 23 03/27/2022   ALKPHOS 72 03/27/2022   BILITOT 0.5 03/27/2022   Lab Results  Component Value Date  HGBA1C 5.4 04/09/2022   HGBA1C 5.1 08/27/2021   HGBA1C 5.5 04/18/2020   HGBA1C 5.3 12/01/2019   HGBA1C 5.3 07/04/2019   Lab Results  Component Value Date   INSULIN 17.8 04/09/2022   INSULIN 16.6 12/01/2019   INSULIN 19.8 07/04/2019   INSULIN 26.5 (H) 01/25/2019   INSULIN 15.6 08/17/2018   Lab Results  Component Value Date   TSH 7.097 (H) 07/03/2022   Lab Results  Component Value Date   CHOL 171 04/09/2022   HDL 62 04/09/2022   LDLCALC 94  04/09/2022   TRIG 82 04/09/2022   CHOLHDL 2.8 04/09/2022   Lab Results  Component Value Date   VD25OH 19.1 (L) 04/09/2022   VD25OH 30.5 12/01/2019   VD25OH 14.4 (L) 07/04/2019   Lab Results  Component Value Date   WBC 8.0 07/03/2022   HGB 10.8 (L) 07/03/2022   HCT 36.2 07/03/2022   MCV 81.3 07/03/2022   PLT 309 07/03/2022   Lab Results  Component Value Date   IRON 18 (L) 12/03/2021   TIBC 351 12/03/2021   FERRITIN 17 12/03/2021   Attestation Statements:   Reviewed by clinician on day of visit: allergies, medications, problem list, medical history, surgical history, family history, social history, and previous encounter notes.  I, Davy Pique, am acting as Location manager for Loyal Gambler, DO.  I have reviewed the above documentation for accuracy and completeness, and I agree with the above. Dell Ponto, DO

## 2022-07-31 ENCOUNTER — Ambulatory Visit (HOSPITAL_COMMUNITY): Payer: Self-pay | Admitting: Pharmacist

## 2022-07-31 LAB — POCT INR: INR: 1.6 — AB (ref 2.0–3.0)

## 2022-08-04 ENCOUNTER — Encounter (INDEPENDENT_AMBULATORY_CARE_PROVIDER_SITE_OTHER): Payer: Self-pay | Admitting: Family Medicine

## 2022-08-04 ENCOUNTER — Ambulatory Visit (INDEPENDENT_AMBULATORY_CARE_PROVIDER_SITE_OTHER): Payer: Medicare Other | Admitting: Family Medicine

## 2022-08-04 VITALS — HR 89 | Temp 98.0°F | Ht 63.0 in | Wt 243.0 lb

## 2022-08-04 DIAGNOSIS — Z6841 Body Mass Index (BMI) 40.0 and over, adult: Secondary | ICD-10-CM

## 2022-08-04 DIAGNOSIS — E88819 Insulin resistance, unspecified: Secondary | ICD-10-CM | POA: Diagnosis not present

## 2022-08-04 DIAGNOSIS — I5022 Chronic systolic (congestive) heart failure: Secondary | ICD-10-CM

## 2022-08-04 DIAGNOSIS — E559 Vitamin D deficiency, unspecified: Secondary | ICD-10-CM

## 2022-08-04 DIAGNOSIS — I428 Other cardiomyopathies: Secondary | ICD-10-CM | POA: Diagnosis not present

## 2022-08-04 DIAGNOSIS — E0590 Thyrotoxicosis, unspecified without thyrotoxic crisis or storm: Secondary | ICD-10-CM

## 2022-08-04 MED ORDER — SEMAGLUTIDE(0.25 OR 0.5MG/DOS) 2 MG/3ML ~~LOC~~ SOPN: PEN_INJECTOR | SUBCUTANEOUS | 0 refills | Status: DC

## 2022-08-04 NOTE — Assessment & Plan Note (Signed)
Taking OTC vitamin D 5,000 IU daily Last vitamin D Lab Results  Component Value Date   VD25OH 19.1 (L) 04/09/2022

## 2022-08-04 NOTE — Assessment & Plan Note (Signed)
Fasting insulin 17.8  10/18 Plans to start going to the gym at [facility] in [place] to try out weight trainer

## 2022-08-04 NOTE — Assessment & Plan Note (Signed)
Her Torsemide was held by cardiology and moved to prn use. Takes when she has DOE Has a minimal amount of LE edema

## 2022-08-04 NOTE — Assessment & Plan Note (Signed)
Managed by endocrinology Reviewed most recent labs with an elevated TSH and normal free T3 and free T4 Methimazole dose was recently reduced and a half This has resulted in some weight gain and increased fatigue Has follow-up with endocrinology in March

## 2022-08-04 NOTE — Progress Notes (Signed)
Office: [phone]  /  Fax: [phone]  WEIGHT SUMMARY AND BIOMETRICS  Medical Weight Loss Height: 5' 3"$  (1.6 m) Weight: 243 lb (110.2 kg) Temp: 98 F (36.7 C) Pulse Rate: 89 BP: -- (BP hard to get) SpO2: 97 % Fasting: no Labs: yes Today's Visit #: 5 Weight at Last VIsit: 236lb Weight Lost Since Last Visit: +7  Body Fat %: 48.4 % Fat Mass (lbs): 117.8 lbs Muscle Mass (lbs): 119 lbs Total Body Water (lbs): 87 lbs Visceral Fat Rating : 15 Starting Date: 04/09/22 Starting Weight: 243lb Total Weight Loss (lbs): 1 lb (0.454 kg)    HPI  Chief Complaint: OBESITY  [name] is here to discuss her progress with her obesity treatment plan. She is on the the Category 2 Plan and states she is following her eating plan approximately 90 % of the time. She states she is walking for 40 minutes 3 times per week.  Interval History:  Since last office visit she has gained 7 lb Feeling more fatigued- endo has reduced her methimazole dose to 1/2 due to elevated TSH with a normal Free T3 and free T4.  Goes back to Dr Kelton Pillar in March.  Appetite has increased.  Eating on plan- getting everything in on plan.  Snacking more at night due to increased hunger on things like nuts, eggs.  Craving more sweets.  Doesn't buy them.  Husband is supportive and they are doing well with meal prep.  Rarely eats out.     Pharmacotherapy: none  PHYSICAL EXAM:  Pulse 89, temperature 98 F (36.7 C), height 5' 3"$  (1.6 m), weight 243 lb (110.2 kg), SpO2 97 %. Body mass index is 43.05 kg/m.  General: She is overweight, cooperative, alert, well developed, and in no acute distress. PSYCH: Has normal mood, affect and thought process.   HEENT: EOMI, sclerae are anicteric. Lungs: Normal breathing effort, no conversational dyspnea. Extremities: No edema.  Neurologic: No gross sensory or motor deficits. No tremors or fasciculations noted.    DIAGNOSTIC DATA REVIEWED:  BMET    Component Value Date/Time    NA 139 07/03/2022 1018   NA 138 02/08/2020 0948   NA 138 09/24/2013 0536   K 3.5 07/03/2022 1018   K 3.7 09/24/2013 0536   CL 107 07/03/2022 1018   CL 107 09/24/2013 0536   CO2 22 07/03/2022 1018   CO2 25 09/24/2013 0536   GLUCOSE 83 07/03/2022 1018   GLUCOSE 82 09/24/2013 0536   BUN 20 07/03/2022 1018   BUN 18 02/08/2020 0948   BUN 11 09/24/2013 0536   CREATININE 1.35 (H) 07/03/2022 1018   CREATININE 1.08 09/24/2013 0536   CALCIUM 9.3 07/03/2022 1018   CALCIUM 8.6 09/24/2013 0536   GFRNONAA 48 (L) 07/03/2022 1018   GFRNONAA >60 09/24/2013 0536   GFRAA >60 02/23/2020 0903   GFRAA >60 09/24/2013 0536   Lab Results  Component Value Date   HGBA1C 5.4 04/09/2022   HGBA1C 5.9 09/19/2013   Lab Results  Component Value Date   INSULIN 17.8 04/09/2022   INSULIN 22.5 10/05/2017   Lab Results  Component Value Date   TSH 7.097 (H) 07/03/2022   CBC    Component Value Date/Time   WBC 8.0 07/03/2022 1018   RBC 4.45 07/03/2022 1018   HGB 10.8 (L) 07/03/2022 1018   HGB 10.1 (L) 12/25/2020 1132   HCT 36.2 07/03/2022 1018   HCT 33.8 (L) 12/25/2020 1132   PLT 309 07/03/2022 1018   PLT 430 12/25/2020  1132   MCV 81.3 07/03/2022 1018   MCV 73 (L) 12/25/2020 1132   MCV 85 09/23/2013 0443   MCH 24.3 (L) 07/03/2022 1018   MCHC 29.8 (L) 07/03/2022 1018   RDW 17.1 (H) 07/03/2022 1018   RDW 17.0 (H) 12/25/2020 1132   RDW 15.4 (H) 09/23/2013 0443   Iron Studies    Component Value Date/Time   IRON 18 (L) 12/03/2021 1021   TIBC 351 12/03/2021 1021   FERRITIN 17 12/03/2021 1021   IRONPCTSAT 5 (L) 12/03/2021 1021   Lipid Panel     Component Value Date/Time   CHOL 171 04/09/2022 1147   CHOL 183 09/20/2013 0424   TRIG 82 04/09/2022 1147   TRIG 105 09/20/2013 0424   HDL 62 04/09/2022 1147   HDL 40 09/20/2013 0424   CHOLHDL 2.8 04/09/2022 1147   CHOLHDL 3.8 08/26/2021 1720   VLDL 19 08/26/2021 1720   VLDL 21 09/20/2013 0424   LDLCALC 94 04/09/2022 1147   LDLCALC 122 (H)  09/20/2013 0424   Hepatic Function Panel     Component Value Date/Time   PROT 7.4 03/27/2022 1010   PROT 7.8 03/10/2018 1507   PROT 7.7 09/19/2013 0431   ALBUMIN 4.2 03/27/2022 1010   ALBUMIN 4.5 03/10/2018 1507   ALBUMIN 3.5 09/19/2013 0431   AST 23 03/27/2022 1010   AST 36 09/19/2013 0431   ALT 19 03/27/2022 1010   ALT 32 09/19/2013 0431   ALKPHOS 72 03/27/2022 1010   ALKPHOS 65 09/19/2013 0431   BILITOT 0.5 03/27/2022 1010   BILITOT 0.4 03/10/2018 1507   BILITOT 0.4 09/19/2013 0431   BILIDIR 0.2 09/01/2021 0331   IBILI 0.8 09/01/2021 0331      Component Value Date/Time   TSH 7.097 (H) 07/03/2022 1018   TSH 4.69 05/12/2022 0834   Nutritional Lab Results  Component Value Date   VD25OH 19.1 (L) 04/09/2022   VD25OH 30.5 12/01/2019   VD25OH 14.4 (L) 07/04/2019     ASSESSMENT AND PLAN  TREATMENT PLAN FOR OBESITY:  Recommended Dietary Goals  [name] is currently in the action stage of change. As such, her goal is to continue weight management plan. She has agreed to the Category 2 Plan.  Behavioral Intervention  We discussed the following Behavioral Modification Strategies today: increasing lean protein intake, increasing vegetables, increasing fiber rich foods, increase water intake, work on meal planning and easy cooking plans, and think about ways to increase physical activity.  Additional resources provided today: NA  Recommended Physical Activity Goals  [name] has been advised to work up to 150 minutes of moderate intensity aerobic activity a week and strengthening exercises 2-3 times per week for cardiovascular health, weight loss maintenance and preservation of muscle mass.   She has agreed to increase physical activity in their day and reduce sedentary time (increase NEAT).  Vigorous exercise limited due to use of LVAD   Pharmacotherapy We discussed various medication options to help [name] with her weight loss efforts and we both agreed to starting Ozempic  for Insulin resistance and heart failure.  ASSOCIATED CONDITIONS ADDRESSED TODAY  Insulin resistance Assessment & Plan: Fasting insulin 17.8  10/18 Plans to start going to the gym at [facility] in [place] to try out weight trainer   Orders: -     Semaglutide(0.25 or 0.5MG/DOS); 0.25 mg [place] weekly injection  Dispense: 3 mL; Refill: 0  Morbid obesity ([place]) Assessment & Plan: Net weight loss of 1 pound in 4 months of medically supervised  weight management Doing well with meal plan and increased physical activity levels Barriers to seeing further results include management for thyroid disorder, insulin resistance and nonischemic cardiomyopathy with limited vigorous exercise on an LVAD  Trying to reduce BMI for heart transplant   Vitamin D deficiency Assessment & Plan: Taking OTC vitamin D 5,000 IU daily Last vitamin D Lab Results  Component Value Date   VD25OH 19.1 (L) 04/09/2022      NICM (nonischemic cardiomyopathy) ([place]) Assessment & Plan: Her Torsemide was held by cardiology and moved to prn use. Takes when she has DOE Has a minimal amount of LE edema    Chronic systolic heart failure ([place]) -     Semaglutide(0.25 or 0.5MG/DOS); 0.25 mg [place] weekly injection  Dispense: 3 mL; Refill: 0  Hyperthyroidism Assessment & Plan: Managed by endocrinology Reviewed most recent labs with an elevated TSH and normal free T3 and free T4 Methimazole dose was recently reduced and a half This has resulted in some weight gain and increased fatigue Has follow-up with endocrinology in March     Return in about 4 weeks (around 09/01/2022).Marland Kitchen She was informed of the importance of frequent follow up visits to maximize her success with intensive lifestyle modifications for her multiple health conditions.   ATTESTASTION STATEMENTS:  Reviewed by clinician on day of visit: allergies, medications, problem list, medical history, surgical history, family history, social history, and previous  encounter notes.   Time spent on visit including pre-visit chart review and post-visit care and charting was 35 minutes.    Dell Ponto, DO

## 2022-08-04 NOTE — Assessment & Plan Note (Signed)
Net weight loss of 1 pound in 4 months of medically supervised weight management Doing well with meal plan and increased physical activity levels Barriers to seeing further results include management for thyroid disorder, insulin resistance and nonischemic cardiomyopathy with limited vigorous exercise on an LVAD  Trying to reduce BMI for heart transplant

## 2022-08-06 ENCOUNTER — Telehealth (INDEPENDENT_AMBULATORY_CARE_PROVIDER_SITE_OTHER): Payer: Self-pay | Admitting: *Deleted

## 2022-08-06 NOTE — Telephone Encounter (Signed)
Prior authorization done via cover my meds. It was denied. [name] (Key: [id number]) Ozempic (0.25 or 0.5 MG/DOSE) 2MG/3ML pen-injectors

## 2022-08-07 ENCOUNTER — Ambulatory Visit (INDEPENDENT_AMBULATORY_CARE_PROVIDER_SITE_OTHER): Payer: Medicare Other

## 2022-08-07 ENCOUNTER — Telehealth: Payer: Self-pay

## 2022-08-07 ENCOUNTER — Ambulatory Visit (HOSPITAL_COMMUNITY): Payer: Self-pay | Admitting: Pharmacist

## 2022-08-07 ENCOUNTER — Telehealth (HOSPITAL_COMMUNITY): Payer: Self-pay | Admitting: Unknown Physician Specialty

## 2022-08-07 DIAGNOSIS — I428 Other cardiomyopathies: Secondary | ICD-10-CM

## 2022-08-07 DIAGNOSIS — Z95811 Presence of heart assist device: Secondary | ICD-10-CM

## 2022-08-07 DIAGNOSIS — Z7901 Long term (current) use of anticoagulants: Secondary | ICD-10-CM

## 2022-08-07 LAB — CUP PACEART REMOTE DEVICE CHECK
Battery Remaining Longevity: 24 mo
Battery Voltage: 2.93 V
Brady Statistic RV Percent Paced: 0 %
Date Time Interrogation Session: 20240215005808
HighPow Impedance: 71 Ohm
Implantable Lead Connection Status: 753985
Implantable Lead Implant Date: 20150706
Implantable Lead Location: 753860
Implantable Pulse Generator Implant Date: 20150706
Lead Channel Impedance Value: 380 Ohm
Lead Channel Impedance Value: 494 Ohm
Lead Channel Pacing Threshold Amplitude: 0.75 V
Lead Channel Pacing Threshold Pulse Width: 0.4 ms
Lead Channel Sensing Intrinsic Amplitude: 5.875 mV
Lead Channel Sensing Intrinsic Amplitude: 5.875 mV
Lead Channel Setting Pacing Amplitude: 2 V
Lead Channel Setting Pacing Pulse Width: 0.4 ms
Lead Channel Setting Sensing Sensitivity: 0.3 mV

## 2022-08-07 LAB — POCT INR: INR: 1.9 — AB (ref 2.0–3.0)

## 2022-08-07 MED ORDER — AMIODARONE HCL 200 MG PO TABS: ORAL_TABLET | ORAL | 3 refills | Status: DC

## 2022-08-07 NOTE — Telephone Encounter (Signed)
yes

## 2022-08-07 NOTE — Telephone Encounter (Signed)
Alerted by device team that pt has had runs of VT/VF. Spoke with pt and instructed her to start Amiodarone 200 mg - 1 pill twice a day for 10 days and then resume a dose of 1 pill daily. Pt tells me that she feels fine and has not felt any heart palpitations. Pt states that she has been in the gym this morning working out. I have sent the Amio script to her pharmacy. I have also added the pt to VAD clinic in the morning for a nurse visit. We will check full labs along with a thyroid panel tomorrow and interrogate the pts VAD.  Tanda Rockers RN, BSN VAD Coordinator 24/7 Pager [phone]

## 2022-08-07 NOTE — Telephone Encounter (Signed)
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  Amiodarone was stopped due to hyperthyroidism.  This has been treated with methimazole, last thyroid indices looked ok.  I think we may have to go back on amiodarone, start 200 mg bid x 10 days then 200 mg daily.  We need to see her in LVAD clinic with labs and full thyroid panel to see if there have been any clinical changes.    [name], any other thoughts?

## 2022-08-07 NOTE — Telephone Encounter (Signed)
Scheduled remote reviewed. Normal device function.   3 VF, 1 VT, 104 NSVT, 28 high V rate NSVT, 1 SVT 4 events treated with ATPx1, one unsuccessful termination with spontaneous termination following.  Route to triage LVAD, hx of PAF, Warfarin Next remote 91 days. LA  LM on patient's VM to have her call us with an update on how she is doing.  Will also forward this over to Dr. [name] in the [place] clinic and copy Dr. [name] for follow up.

## 2022-08-07 NOTE — Telephone Encounter (Signed)
Patient returned call stated she was not near her phone before and she is now

## 2022-08-08 ENCOUNTER — Ambulatory Visit (HOSPITAL_COMMUNITY)
Admission: RE | Admit: 2022-08-08 | Discharge: 2022-08-08 | Disposition: A | Discharge status: Home / Self Care | Payer: Medicare Other | Source: Ambulatory Visit | Attending: Internal Medicine | Admitting: Internal Medicine

## 2022-08-08 DIAGNOSIS — I5022 Chronic systolic (congestive) heart failure: Secondary | ICD-10-CM | POA: Diagnosis not present

## 2022-08-08 DIAGNOSIS — Z95811 Presence of heart assist device: Secondary | ICD-10-CM | POA: Diagnosis not present

## 2022-08-08 DIAGNOSIS — Z7901 Long term (current) use of anticoagulants: Secondary | ICD-10-CM | POA: Insufficient documentation

## 2022-08-08 LAB — COMPREHENSIVE METABOLIC PANEL
ALT: 29 U/L (ref 0–44)
AST: 28 U/L (ref 15–41)
Albumin: 4 g/dL (ref 3.5–5.0)
Alkaline Phosphatase: 77 U/L (ref 38–126)
Anion gap: 10 (ref 5–15)
BUN: 27 mg/dL — ABNORMAL HIGH (ref 6–20)
CO2: 24 mmol/L (ref 22–32)
Calcium: 9.4 mg/dL (ref 8.9–10.3)
Chloride: 103 mmol/L (ref 98–111)
Creatinine, Ser: 1.35 mg/dL — ABNORMAL HIGH (ref 0.44–1.00)
GFR, Estimated: 48 mL/min — ABNORMAL LOW (ref >60)
Glucose, Bld: 99 mg/dL (ref 70–99)
Potassium: 3.4 mmol/L — ABNORMAL LOW (ref 3.5–5.1)
Sodium: 137 mmol/L (ref 135–145)
Total Bilirubin: 0.5 mg/dL (ref 0.3–1.2)
Total Protein: 7.6 g/dL (ref 6.5–8.1)

## 2022-08-08 LAB — CBC
HCT: 38.6 % (ref 36.0–46.0)
Hemoglobin: 11.3 g/dL — ABNORMAL LOW (ref 12.0–15.0)
MCH: 21.9 pg — ABNORMAL LOW (ref 26.0–34.0)
MCHC: 29.3 g/dL — ABNORMAL LOW (ref 30.0–36.0)
MCV: 74.8 fL — ABNORMAL LOW (ref 80.0–100.0)
Platelets: 406 10*3/uL — ABNORMAL HIGH (ref 150–400)
RBC: 5.16 MIL/uL — ABNORMAL HIGH (ref 3.87–5.11)
RDW: 17.8 % — ABNORMAL HIGH (ref 11.5–15.5)
WBC: 9.5 10*3/uL (ref 4.0–10.5)
nRBC: 0 % (ref 0.0–0.2)

## 2022-08-08 LAB — PROTIME-INR
INR: 1.9 — ABNORMAL HIGH (ref 0.8–1.2)
Prothrombin Time: 21.9 seconds — ABNORMAL HIGH (ref 11.4–15.2)

## 2022-08-08 LAB — T4, FREE: Free T4: 0.87 ng/dL (ref 0.61–1.12)

## 2022-08-08 LAB — MAGNESIUM: Magnesium: 2.2 mg/dL (ref 1.7–2.4)

## 2022-08-08 LAB — LACTATE DEHYDROGENASE: LDH: 185 U/L (ref 98–192)

## 2022-08-08 LAB — TSH: TSH: 4.75 u[IU]/mL — ABNORMAL HIGH (ref 0.350–4.500)

## 2022-08-08 MED ORDER — POTASSIUM CHLORIDE CRYS ER 20 MEQ PO TBCR: 60.0000 meq | EXTENDED_RELEASE_TABLET | Freq: Every day | ORAL | 3 refills | Status: DC

## 2022-08-08 NOTE — Progress Notes (Signed)
Patient presented for a sick visit in [facility] today alone. Reports no problems with VAD equipment or concerns with drive line.  VAD team was alerted yesterday by device clinic that pt has had quite a bit of VT/VT. DR [name] reviewed and added Amiodarone 200 mg bid for 10 days and then resume 200 daily. This was started by the pt last night.  Pt looks good today and tells me that she has been working out at the gym everyday with no issues and is surprised by the arrhthymia alerts.   Pt states that with her new insurance the quinidine is going to be over $200 a month for her. I will reach out to the EP team today to see if there is different option for her. We have also had difficulty obtaining this medication in the past.  Pt states that she is taking Torsemide 20 mg once approx once a week. She states that if she gets winded walking up steps then she will take a Torsemide.  Pt is participating in healthy weight and wellness program and has been able to increase her exercise routine without any limitation.  Vital Signs:  Doppler Pressure: 106  Automatc BP: UTO HR: 80 SR w/occ PVCs SPO2: UTO   Weight: 241.4 lbs w/o eqt Last weight: 235.4 lb w/o eqt Last home weight: 236  VAD Indication: Destination Therapy d/t BMI   VAD interrogation & Equipment Management: Speed: 5900 Flow: 5.2 Power: 4.8 w    PI: 2.9   Alarms: none Events: 10-20 daily  Fixed speed 5900 Low speed limit: 5600   Primary Controller:  Replace back up battery in 21 months. Back up controller:   Replace back up battery in 26 months.    Annual Equipment Maintenance on UBC/PM was performed on 09/03/21.    I reviewed the LVAD parameters from today and compared the results to the patient's prior recorded data. LVAD interrogation was NEGATIVE for significant power changes, NEGATIVE for clinical alarms and STABLE for PI events/speed drops. No programming changes were made and pump is functioning within specified  parameters. Pt is performing daily controller and system monitor self tests along with completing weekly and monthly maintenance for LVAD equipment.   LVAD equipment check completed and is in good working order. Back-up equipment present. Charged back up battery and performed self-test on equipment.    Exit Site Care: Drive line is being maintained weekly by her husband [name]. Dressing CDI. Pt has been using the cath grip anchors and states these have improved her skin irritation. Pt has sufficient kits at home.  Significant Events on VAD Support:    Device: Medtronic Therapies: on VF 240 VT 200 VVI: 40 Last check: 05/06/2022   BP & Labs:  Doppler 106 - reflecting modified systolic   Hgb 99991111 - No S/S of bleeding. Specifically denies vaginal bleeding, melena/BRBPR or nosebleeds.    LDH stable at 185 with established baseline of 250 - 450. Denies tea-colored urine. No power elevations noted on interrogation.    Plan:  Increase K+ to 60 mEq daily Please do not take any Torsemide  Liberalize fluid intake Coumadin dosing per Ander Purpura PharmD Return to clinic at your regularly scheduled appt  Tanda Rockers RN,BSN [place] Coordinator  Office: [phone]  24/7 Pager: [phone]

## 2022-08-09 LAB — T3, FREE: T3, Free: 2.8 pg/mL (ref 2.0–4.4)

## 2022-08-11 NOTE — Telephone Encounter (Signed)
[name] I agree, if she still on quinidine, I wonder whether we need to think about sending her to an advanced center for possible mapping and ablation of epicardial focus.

## 2022-08-11 NOTE — Telephone Encounter (Signed)
She is still on quinidine, now on both quinidine and amiodarone. Agree with considering advanced center for consideration of VT ablation.  My gestalt would be to check look at [facility], do you have any other advice?

## 2022-08-13 ENCOUNTER — Other Ambulatory Visit (HOSPITAL_COMMUNITY): Payer: Self-pay

## 2022-08-13 ENCOUNTER — Telehealth (HOSPITAL_COMMUNITY): Payer: Self-pay | Admitting: Pharmacy Technician

## 2022-08-13 ENCOUNTER — Other Ambulatory Visit (HOSPITAL_COMMUNITY): Payer: Self-pay | Admitting: Pharmacist

## 2022-08-13 ENCOUNTER — Other Ambulatory Visit (HOSPITAL_BASED_OUTPATIENT_CLINIC_OR_DEPARTMENT_OTHER): Payer: Self-pay

## 2022-08-13 DIAGNOSIS — I5022 Chronic systolic (congestive) heart failure: Secondary | ICD-10-CM

## 2022-08-13 DIAGNOSIS — I480 Paroxysmal atrial fibrillation: Secondary | ICD-10-CM

## 2022-08-13 DIAGNOSIS — Z95811 Presence of heart assist device: Secondary | ICD-10-CM

## 2022-08-13 MED ORDER — QUINIDINE GLUCONATE ER 324 MG PO TBCR
324.0000 mg | EXTENDED_RELEASE_TABLET | Freq: Two times a day (BID) | ORAL | 6 refills | Status: DC
  Filled 2022-08-13: qty 60, 30d supply, fill #0
  Filled 2022-08-14: qty 40, 20d supply, fill #0
  Filled 2022-09-30: qty 60, 30d supply, fill #1
  Filled 2022-09-30: qty 40, 20d supply, fill #1
  Filled 2022-11-08: qty 60, 30d supply, fill #0
  Filled 2022-12-24: qty 60, 30d supply, fill #1
  Filled 2023-01-23: qty 60, 30d supply, fill #2
  Filled 2023-03-09: qty 60, 30d supply, fill #3
  Filled 2023-04-24: qty 60, 30d supply, fill #4
  Filled 2023-05-27: qty 20, 10d supply, fill #5

## 2022-08-13 NOTE — Telephone Encounter (Signed)
Advanced Heart Failure Patient Advocate Encounter  The patient was approved for an [employer] that will help cover the cost of Quinidine. The grant will cover the medication regardless of cost. Eligibility, 06/23/22 - 06/23/23.  ID [id number]  BIN [id number]  PCN [id number]  Group [id number]  Called and spoke with the patient. She is aware the [place] may ask for income verification. If so, they will either email or mail the request. It will have to be completed and turned back in within 30 days or the grant will be closed. Patient would like for [name] to mail the RX to her. Sent 30 day RX request to [facility]) to send to [facility]

## 2022-08-14 ENCOUNTER — Other Ambulatory Visit (HOSPITAL_BASED_OUTPATIENT_CLINIC_OR_DEPARTMENT_OTHER): Payer: Self-pay

## 2022-08-14 ENCOUNTER — Other Ambulatory Visit (HOSPITAL_COMMUNITY): Payer: Self-pay

## 2022-08-14 ENCOUNTER — Ambulatory Visit (HOSPITAL_COMMUNITY): Payer: Self-pay | Admitting: Pharmacist

## 2022-08-18 ENCOUNTER — Other Ambulatory Visit (HOSPITAL_COMMUNITY): Payer: Self-pay

## 2022-08-18 ENCOUNTER — Encounter: Payer: Self-pay | Admitting: Internal Medicine

## 2022-08-19 ENCOUNTER — Other Ambulatory Visit (HOSPITAL_COMMUNITY): Payer: Self-pay

## 2022-08-20 ENCOUNTER — Other Ambulatory Visit (HOSPITAL_COMMUNITY): Payer: Self-pay

## 2022-08-20 ENCOUNTER — Other Ambulatory Visit: Payer: Self-pay

## 2022-08-21 ENCOUNTER — Other Ambulatory Visit (HOSPITAL_COMMUNITY): Payer: Self-pay

## 2022-08-21 ENCOUNTER — Other Ambulatory Visit (HOSPITAL_COMMUNITY): Payer: Self-pay | Admitting: Unknown Physician Specialty

## 2022-08-21 ENCOUNTER — Ambulatory Visit (HOSPITAL_COMMUNITY): Payer: Self-pay | Admitting: Pharmacist

## 2022-08-21 ENCOUNTER — Other Ambulatory Visit: Payer: Self-pay

## 2022-08-21 LAB — POCT INR: INR: 2.2 (ref 2.0–3.0)

## 2022-08-21 MED ORDER — DAPAGLIFLOZIN PROPANEDIOL 10 MG PO TABS: 10.0000 mg | ORAL_TABLET | Freq: Every day | ORAL | 3 refills | Status: DC

## 2022-08-21 MED ORDER — ROSUVASTATIN CALCIUM 40 MG PO TABS: 40.0000 mg | ORAL_TABLET | Freq: Every day | ORAL | 3 refills | Status: DC

## 2022-08-22 ENCOUNTER — Other Ambulatory Visit: Payer: Self-pay

## 2022-08-22 ENCOUNTER — Other Ambulatory Visit (HOSPITAL_COMMUNITY): Payer: Self-pay

## 2022-08-25 ENCOUNTER — Other Ambulatory Visit: Payer: Self-pay

## 2022-08-25 ENCOUNTER — Other Ambulatory Visit (HOSPITAL_COMMUNITY): Payer: Self-pay

## 2022-08-26 ENCOUNTER — Encounter (INDEPENDENT_AMBULATORY_CARE_PROVIDER_SITE_OTHER): Payer: Self-pay | Admitting: Family Medicine

## 2022-08-26 ENCOUNTER — Ambulatory Visit (INDEPENDENT_AMBULATORY_CARE_PROVIDER_SITE_OTHER): Payer: Medicare Other | Admitting: Family Medicine

## 2022-08-26 VITALS — HR 53 | Temp 98.3°F | Ht 63.0 in | Wt 240.0 lb

## 2022-08-26 DIAGNOSIS — I428 Other cardiomyopathies: Secondary | ICD-10-CM | POA: Diagnosis not present

## 2022-08-26 DIAGNOSIS — E0590 Thyrotoxicosis, unspecified without thyrotoxic crisis or storm: Secondary | ICD-10-CM | POA: Diagnosis not present

## 2022-08-26 DIAGNOSIS — E88819 Insulin resistance, unspecified: Secondary | ICD-10-CM

## 2022-08-26 DIAGNOSIS — Z6841 Body Mass Index (BMI) 40.0 and over, adult: Secondary | ICD-10-CM

## 2022-08-26 DIAGNOSIS — E559 Vitamin D deficiency, unspecified: Secondary | ICD-10-CM | POA: Diagnosis not present

## 2022-08-26 NOTE — Assessment & Plan Note (Signed)
Last fasting insulin elevated at 17.8 04/09/2022.  She has actively been working on reducing intake of added sugar and refined carbohydrates.  She has added in more daily steps.  Plan to recheck fasting insulin in the next 1 to 2 months.  Continue to limit added sugar and stick to prescribed meal plan

## 2022-08-26 NOTE — Assessment & Plan Note (Signed)
Last vitamin D Lab Results  Component Value Date   VD25OH 19.1 (L) 04/09/2022   Patient has been taking over-the-counter vitamin D 5000 IU once daily.  We have discussed her target goal vitamin D 50-70.  Plan to recheck vitamin D level in the next 1 to 2 months.  Continue over-the-counter vitamin D 5000 IU every day.

## 2022-08-26 NOTE — Progress Notes (Signed)
Office: [phone]  /  Fax: [phone]  WEIGHT SUMMARY AND BIOMETRICS  Vitals Temp: 98.3 F (36.8 C) BP: -- (unable to get BP reading) Pulse Rate: (!) 53 SpO2: 100 %   Anthropometric Measurements Height: '5\' 3"'$  (1.6 m) Weight: 240 lb (108.9 kg) BMI (Calculated): 42.52 Weight at Last Visit: 243lb Weight Lost Since Last Visit: 3lb Starting Weight: 243lb Total Weight Loss (lbs): 3 lb (1.361 kg)   Body Composition  Body Fat %: 46.1 % Fat Mass (lbs): 110.8 lbs Muscle Mass (lbs): 123 lbs Total Body Water (lbs): 84.2 lbs Visceral Fat Rating : 14   Other Clinical Data Fasting: yes Labs: no Today's Visit #: 6 Starting Date: 04/09/22   HPI  Chief Complaint: OBESITY  [name] is here to discuss her progress with her obesity treatment plan. She is on the the Category 2 Plan and states she is following her eating plan approximately 90 % of the time. She states she is exercising 45-60 minutes 3-4 times per week.   Interval History:  Since last office visit she is down 3 lb This gives her a net weight loss of 3 pounds in the past 4 months of medically supervised weight management Her bioimpedence shows a 4 lb muscle mass gain and 7 lb of body fat loss in the past 4 wks She is doing weight training at the gym 3-4 x a week She is sticking to her meal plan Her TSH went up with new addition of amiodorone  She is working on BMI reduction for cardiac transplant, using LVAD Has had increase in abdominal distention with diuretics held by cardiology - this has reduced her appetite. She has added in a protein shake daily  Pharmacotherapy: none (did not start Ozempic -- insurance)  PHYSICAL EXAM:  Pulse (!) 53, temperature 98.3 F (36.8 C), height '5\' 3"'$  (1.6 m), weight 240 lb (108.9 kg), SpO2 100 %. Body mass index is 42.51 kg/m.  General: She is overweight, cooperative, alert, well developed, and in no acute distress. PSYCH: Has normal mood, affect and thought process.    Lungs: Normal breathing effort, no conversational dyspnea. Has LVAD  DIAGNOSTIC DATA REVIEWED:  BMET    Component Value Date/Time   NA 137 08/08/2022 0903   NA 138 02/08/2020 0948   NA 138 09/24/2013 0536   K 3.4 (L) 08/08/2022 0903   K 3.7 09/24/2013 0536   CL 103 08/08/2022 0903   CL 107 09/24/2013 0536   CO2 24 08/08/2022 0903   CO2 25 09/24/2013 0536   GLUCOSE 99 08/08/2022 0903   GLUCOSE 82 09/24/2013 0536   BUN 27 (H) 08/08/2022 0903   BUN 18 02/08/2020 0948   BUN 11 09/24/2013 0536   CREATININE 1.35 (H) 08/08/2022 0903   CREATININE 1.08 09/24/2013 0536   CALCIUM 9.4 08/08/2022 0903   CALCIUM 8.6 09/24/2013 0536   GFRNONAA 48 (L) 08/08/2022 0903   GFRNONAA >60 09/24/2013 0536   GFRAA >60 02/23/2020 0903   GFRAA >60 09/24/2013 0536   Lab Results  Component Value Date   HGBA1C 5.4 04/09/2022   HGBA1C 5.9 09/19/2013   Lab Results  Component Value Date   INSULIN 17.8 04/09/2022   INSULIN 22.5 10/05/2017   Lab Results  Component Value Date   TSH 4.750 (H) 08/08/2022   CBC    Component Value Date/Time   WBC 9.5 08/08/2022 0903   RBC 5.16 (H) 08/08/2022 0903   HGB 11.3 (L) 08/08/2022 0903   HGB 10.1 (L) 12/25/2020  1132   HCT 38.6 08/08/2022 0903   HCT 33.8 (L) 12/25/2020 1132   PLT 406 (H) 08/08/2022 0903   PLT 430 12/25/2020 1132   MCV 74.8 (L) 08/08/2022 0903   MCV 73 (L) 12/25/2020 1132   MCV 85 09/23/2013 0443   MCH 21.9 (L) 08/08/2022 0903   MCHC 29.3 (L) 08/08/2022 0903   RDW 17.8 (H) 08/08/2022 0903   RDW 17.0 (H) 12/25/2020 1132   RDW 15.4 (H) 09/23/2013 0443   Iron Studies    Component Value Date/Time   IRON 18 (L) 12/03/2021 1021   TIBC 351 12/03/2021 1021   FERRITIN 17 12/03/2021 1021   IRONPCTSAT 5 (L) 12/03/2021 1021   Lipid Panel     Component Value Date/Time   CHOL 171 04/09/2022 1147   CHOL 183 09/20/2013 0424   TRIG 82 04/09/2022 1147   TRIG 105 09/20/2013 0424   HDL 62 04/09/2022 1147   HDL 40 09/20/2013 0424    CHOLHDL 2.8 04/09/2022 1147   CHOLHDL 3.8 08/26/2021 1720   VLDL 19 08/26/2021 1720   VLDL 21 09/20/2013 0424   LDLCALC 94 04/09/2022 1147   LDLCALC 122 (H) 09/20/2013 0424   Hepatic Function Panel     Component Value Date/Time   PROT 7.6 08/08/2022 0903   PROT 7.8 03/10/2018 1507   PROT 7.7 09/19/2013 0431   ALBUMIN 4.0 08/08/2022 0903   ALBUMIN 4.5 03/10/2018 1507   ALBUMIN 3.5 09/19/2013 0431   AST 28 08/08/2022 0903   AST 36 09/19/2013 0431   ALT 29 08/08/2022 0903   ALT 32 09/19/2013 0431   ALKPHOS 77 08/08/2022 0903   ALKPHOS 65 09/19/2013 0431   BILITOT 0.5 08/08/2022 0903   BILITOT 0.4 03/10/2018 1507   BILITOT 0.4 09/19/2013 0431   BILIDIR 0.2 09/01/2021 0331   IBILI 0.8 09/01/2021 0331      Component Value Date/Time   TSH 4.750 (H) 08/08/2022 0903   TSH 4.69 05/12/2022 0834   Nutritional Lab Results  Component Value Date   VD25OH 19.1 (L) 04/09/2022   VD25OH 30.5 12/01/2019   VD25OH 14.4 (L) 07/04/2019     ASSESSMENT AND PLAN  TREATMENT PLAN FOR OBESITY:  Recommended Dietary Goals  [name] is currently in the action stage of change. As such, her goal is to continue weight management plan. She has agreed to the Category 2 Plan.  Behavioral Intervention  We discussed the following Behavioral Modification Strategies today: increasing lean protein intake, increasing vegetables, increasing fiber rich foods, increasing water intake, work on meal planning and easy cooking plans, and decreasing sodium intake.  Additional resources provided today: NA  Recommended Physical Activity Goals  [name] has been advised to work up to 150 minutes of moderate intensity aerobic activity a week and strengthening exercises 2-3 times per week for cardiovascular health, weight loss maintenance and preservation of muscle mass.   She has agreed to continue physical activity as is.    Pharmacotherapy We discussed various medication options to help [name] with her weight  loss efforts and we both agreed to [employer], pending approval  ASSOCIATED CONDITIONS ADDRESSED TODAY  Insulin resistance Assessment & Plan: Last fasting insulin elevated at 17.8 04/09/2022.  She has actively been working on reducing intake of added sugar and refined carbohydrates.  She has added in more daily steps.  Plan to recheck fasting insulin in the next 1 to 2 months.  Continue to limit added sugar and stick to prescribed meal plan   Morbid obesity ([place])  BMI 40.0-44.9, adult (HCC)  NICM (nonischemic cardiomyopathy) ([place]) Assessment & Plan: Patient is closely managed by cardiology.  She has an LVAD and is looking forward to cardiac transplant surgery in the future.  She has actively been working on BMI reduction in preparation for transplant.  With her diagnosis of nonischemic cardiomyopathy, insulin resistance and history of MI, she is a good candidate for use of Ozempic based on new cardiac data.  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    Hyperthyroidism Assessment & Plan: Patient has upcoming follow-up scheduled with Dr. Kelton Pillar for management of her thyroid.  Her dose of methimazole was reduced and her TSH is elevated.  She has concerns about amiodarone's effect on her thyroid function.  She feels like her TSH has affected her weight loss over the last month.   Vitamin D deficiency Assessment & Plan: Last vitamin D Lab Results  Component Value Date   VD25OH 19.1 (L) 04/09/2022   Patient has been taking over-the-counter vitamin D 5000 IU once daily.  We have discussed her target goal vitamin D 50-70.  Plan to recheck vitamin D level in the next 1 to 2 months.  Continue over-the-counter vitamin D 5000 IU every day.       No follow-ups on file.Marland Kitchen She was informed of the importance of frequent follow up visits to maximize her success with intensive lifestyle modifications for her multiple health conditions.   ATTESTASTION STATEMENTS:  Reviewed by  clinician on day of visit: allergies, medications, problem list, medical history, surgical history, family history, social history, and previous encounter notes pertinent to obesity diagnosis.   I have personally spent 30 minutes total time today in preparation, patient care, nutritional counseling and documentation for this visit, including the following: review of clinical lab tests; review of medical tests/procedures/services.      Dell Ponto, DO

## 2022-08-26 NOTE — Assessment & Plan Note (Signed)
Patient is closely managed by cardiology.  She has an LVAD and is looking forward to cardiac transplant surgery in the future.  She has actively been working on BMI reduction in preparation for transplant.  With her diagnosis of nonischemic cardiomyopathy, insulin resistance and history of MI, she is a good candidate for use of Ozempic based on new cardiac data.  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

## 2022-08-26 NOTE — Assessment & Plan Note (Signed)
Patient has upcoming follow-up scheduled with Dr. Kelton Pillar for management of her thyroid.  Her dose of methimazole was reduced and her TSH is elevated.  She has concerns about amiodarone's effect on her thyroid function.  She feels like her TSH has affected her weight loss over the last month.

## 2022-08-28 ENCOUNTER — Ambulatory Visit (HOSPITAL_COMMUNITY): Payer: Self-pay | Admitting: Pharmacist

## 2022-08-28 LAB — POCT INR: INR: 2.3 (ref 2.0–3.0)

## 2022-09-02 ENCOUNTER — Other Ambulatory Visit: Payer: Self-pay

## 2022-09-02 ENCOUNTER — Encounter (INDEPENDENT_AMBULATORY_CARE_PROVIDER_SITE_OTHER): Payer: Self-pay | Admitting: General Practice

## 2022-09-02 NOTE — Progress Notes (Signed)
Remote ICD transmission.   

## 2022-09-02 NOTE — Telephone Encounter (Signed)
Faxed over Medical letter for appeal for patients Ozempic to [facility]  at [phone].

## 2022-09-04 ENCOUNTER — Other Ambulatory Visit (HOSPITAL_COMMUNITY): Payer: Self-pay

## 2022-09-04 ENCOUNTER — Ambulatory Visit (HOSPITAL_COMMUNITY): Payer: Self-pay | Admitting: Pharmacist

## 2022-09-04 DIAGNOSIS — Z7901 Long term (current) use of anticoagulants: Secondary | ICD-10-CM

## 2022-09-04 DIAGNOSIS — Z95811 Presence of heart assist device: Secondary | ICD-10-CM

## 2022-09-04 LAB — POCT INR: INR: 2 (ref 2.0–3.0)

## 2022-09-05 ENCOUNTER — Other Ambulatory Visit (HOSPITAL_COMMUNITY): Payer: Self-pay

## 2022-09-08 ENCOUNTER — Ambulatory Visit (HOSPITAL_COMMUNITY): Payer: Self-pay | Admitting: Pharmacist

## 2022-09-08 ENCOUNTER — Ambulatory Visit (HOSPITAL_COMMUNITY)
Admission: RE | Admit: 2022-09-08 | Discharge: 2022-09-08 | Disposition: A | Discharge status: Home / Self Care | Payer: Medicare Other | Source: Ambulatory Visit | Attending: Cardiology | Admitting: Cardiology

## 2022-09-08 DIAGNOSIS — I130 Hypertensive heart and chronic kidney disease with heart failure and stage 1 through stage 4 chronic kidney disease, or unspecified chronic kidney disease: Secondary | ICD-10-CM | POA: Insufficient documentation

## 2022-09-08 DIAGNOSIS — Z90722 Acquired absence of ovaries, bilateral: Secondary | ICD-10-CM | POA: Insufficient documentation

## 2022-09-08 DIAGNOSIS — Z7901 Long term (current) use of anticoagulants: Secondary | ICD-10-CM | POA: Insufficient documentation

## 2022-09-08 DIAGNOSIS — I428 Other cardiomyopathies: Secondary | ICD-10-CM | POA: Insufficient documentation

## 2022-09-08 DIAGNOSIS — Z8249 Family history of ischemic heart disease and other diseases of the circulatory system: Secondary | ICD-10-CM | POA: Diagnosis not present

## 2022-09-08 DIAGNOSIS — Z7982 Long term (current) use of aspirin: Secondary | ICD-10-CM | POA: Insufficient documentation

## 2022-09-08 DIAGNOSIS — Z8673 Personal history of transient ischemic attack (TIA), and cerebral infarction without residual deficits: Secondary | ICD-10-CM | POA: Insufficient documentation

## 2022-09-08 DIAGNOSIS — Z95810 Presence of automatic (implantable) cardiac defibrillator: Secondary | ICD-10-CM | POA: Insufficient documentation

## 2022-09-08 DIAGNOSIS — Z79899 Other long term (current) drug therapy: Secondary | ICD-10-CM | POA: Insufficient documentation

## 2022-09-08 DIAGNOSIS — Z7984 Long term (current) use of oral hypoglycemic drugs: Secondary | ICD-10-CM | POA: Insufficient documentation

## 2022-09-08 DIAGNOSIS — I081 Rheumatic disorders of both mitral and tricuspid valves: Secondary | ICD-10-CM | POA: Diagnosis not present

## 2022-09-08 DIAGNOSIS — Z823 Family history of stroke: Secondary | ICD-10-CM | POA: Diagnosis not present

## 2022-09-08 DIAGNOSIS — Z95811 Presence of heart assist device: Secondary | ICD-10-CM | POA: Diagnosis not present

## 2022-09-08 DIAGNOSIS — Z90710 Acquired absence of both cervix and uterus: Secondary | ICD-10-CM | POA: Insufficient documentation

## 2022-09-08 DIAGNOSIS — I5022 Chronic systolic (congestive) heart failure: Secondary | ICD-10-CM | POA: Insufficient documentation

## 2022-09-08 DIAGNOSIS — Z79890 Hormone replacement therapy (postmenopausal): Secondary | ICD-10-CM | POA: Insufficient documentation

## 2022-09-08 DIAGNOSIS — I493 Ventricular premature depolarization: Secondary | ICD-10-CM | POA: Diagnosis not present

## 2022-09-08 DIAGNOSIS — N183 Chronic kidney disease, stage 3 unspecified: Secondary | ICD-10-CM | POA: Diagnosis not present

## 2022-09-08 LAB — CBC
HCT: 40.8 % (ref 36.0–46.0)
Hemoglobin: 11.6 g/dL — ABNORMAL LOW (ref 12.0–15.0)
MCH: 20.5 pg — ABNORMAL LOW (ref 26.0–34.0)
MCHC: 28.4 g/dL — ABNORMAL LOW (ref 30.0–36.0)
MCV: 72.2 fL — ABNORMAL LOW (ref 80.0–100.0)
Platelets: 351 10*3/uL (ref 150–400)
RBC: 5.65 MIL/uL — ABNORMAL HIGH (ref 3.87–5.11)
RDW: 19.2 % — ABNORMAL HIGH (ref 11.5–15.5)
WBC: 5.8 10*3/uL (ref 4.0–10.5)
nRBC: 0 % (ref 0.0–0.2)

## 2022-09-08 LAB — PROTIME-INR
INR: 2.2 — ABNORMAL HIGH (ref 0.8–1.2)
Prothrombin Time: 24.2 seconds — ABNORMAL HIGH (ref 11.4–15.2)

## 2022-09-08 LAB — COMPREHENSIVE METABOLIC PANEL
ALT: 26 U/L (ref 0–44)
AST: 26 U/L (ref 15–41)
Albumin: 3.9 g/dL (ref 3.5–5.0)
Alkaline Phosphatase: 83 U/L (ref 38–126)
Anion gap: 9 (ref 5–15)
BUN: 20 mg/dL (ref 6–20)
CO2: 25 mmol/L (ref 22–32)
Calcium: 9.6 mg/dL (ref 8.9–10.3)
Chloride: 105 mmol/L (ref 98–111)
Creatinine, Ser: 1.42 mg/dL — ABNORMAL HIGH (ref 0.44–1.00)
GFR, Estimated: 45 mL/min — ABNORMAL LOW (ref >60)
Glucose, Bld: 71 mg/dL (ref 70–99)
Potassium: 4 mmol/L (ref 3.5–5.1)
Sodium: 139 mmol/L (ref 135–145)
Total Bilirubin: 0.4 mg/dL (ref 0.3–1.2)
Total Protein: 7.2 g/dL (ref 6.5–8.1)

## 2022-09-08 LAB — HEMOGLOBIN A1C
Hgb A1c MFr Bld: 5.6 % (ref 4.8–5.6)
Mean Plasma Glucose: 114 mg/dL

## 2022-09-08 LAB — LACTATE DEHYDROGENASE: LDH: 206 U/L — ABNORMAL HIGH (ref 98–192)

## 2022-09-08 LAB — PREALBUMIN: Prealbumin: 26 mg/dL (ref 18–38)

## 2022-09-08 MED ORDER — TORSEMIDE 20 MG PO TABS: 20.0000 mg | ORAL_TABLET | ORAL | 3 refills | Status: DC

## 2022-09-08 NOTE — Patient Instructions (Addendum)
Resume Torsemide 20mg  every other day Please call VAD Coordinator if lightheadedness and dizziness occurs BMET 10 days Return to [facility] in 2 months for full visit with Dr. [name]

## 2022-09-08 NOTE — Progress Notes (Addendum)
Patient presented for a 2 month follow up and 1 year Intermacs and Annual Maintenance in [facility] today alone. Reports no problems with VAD equipment or concerns with drive line.  Pt denies lightheadedness, dizziness, falls, shortness of breath, and signs of bleeding. She endorses good hydration.   Pt states she notices she has put on weight. Weight up 10 pounds today. Pt is participating in healthy weight and wellness program and has been able to increase her exercise routine without any limitation. Will add back Torsemide 20 mg every other day per Dr. [name]. Pt advised to call VAD Coordinator if symptoms of dizziness and lightheadedness resume. Will obtain a BMET in 10 days.   Device clinic notified of recent VT/VF 2/15. Patient continues to take Amiodarone 200 mg daily and Quinidine 324 mg BID. Pt denies any palpitations of ICD shocks. Pt in NSR today with a rate of 96.   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 Thyroid panel drawn last visit and pt has follow up with Endocrinology next week.  Pt denies any signs and symptoms of bleeding. She did have a positive Cologuard that she will be seeing [facility] for 4/3.   Vital Signs:  Doppler Pressure: 98  Automatc BP: 135/66 (79) HR: 96 SR SPO2: UTO   Weight: 251.6 lbs w/o eqt Last weight: 241.4 lb w/o eqt  VAD Indication: Destination Therapy d/t BMI   VAD interrogation & Equipment Management: Speed: 5900 Flow: 5.4 Power: 4.9 w    PI: 2.8   Alarms: none Events: >100  Fixed speed 5900 Low speed limit: 5600   Primary Controller:  Replace back up battery in 21 months. Back up controller:   Replace back up battery in 26 months.    Annual Equipment Maintenance on UBC/PM was performed on 09/03/21.    I reviewed the LVAD parameters from today and compared the results to the patient's prior recorded data. LVAD interrogation was NEGATIVE for significant power changes, NEGATIVE for clinical alarms and STABLE  for PI events/speed drops. No programming changes were made and pump is functioning within specified parameters. Pt is performing daily controller and system monitor self tests along with completing weekly and monthly maintenance for LVAD equipment.   LVAD equipment check completed and is in good working order. Back-up equipment present. Charged back up battery and performed self-test on equipment.   Patient completed 1400 feet during 6 minute walk test. Tolerated well.   Neurocognitive trail making completed correctly in 1 m 0 s.   [address] Cardiomyopathy, EQ-5D-3L and post-VAD QOL completed by the patient independently.  [place] Cardiomyopathy Questionnaire     09/08/2022    3:45 PM 03/27/2022    1:09 PM 11/27/2021   10:49 AM  KCCQ-12  1 a. Ability to shower/bathe Not at all limited Not at all limited Not at all limited  1 b. Ability to walk 1 block Not at all limited Slightly limited Moderately limited  1 c. Ability to hurry/jog Moderately limited Moderately limited Other, Did not do  2. Edema feet/ankles/legs Never over the past 2 weeks Less than once a week Never over the past 2 weeks  3. Limited by fatigue Less than once a week Less than once a week 1-2 times a week  4. Limited by dyspnea Less than once a week Less than once a week Never over the past 2 weeks  5. Sitting up / on 3+ pillows Never over the past 2 weeks Never over the past  2 weeks Never over the past 2 weeks  6. Limited enjoyment of life Not limited at all Not limited at all Not limited at all  7. Rest of life w/ symptoms Completely satisfied Completely satisfied Completely satisfied  8 a. Participation in hobbies Did not limit at all Did not limit at all Did not limit at all  8 b. Participation in chores Slightly limited Did not limit at all Slightly limited  8 c. Visiting family/friends Did not limit at all Did not limit at all Did not limit at all     Exit Site Care: Drive line exit site cleaned with Vashe  soaked gauze x 2, allowed to dry, drive line cleansed with sterile saline x 2. Sterile 2x2 placed flush with exit site. Exit site healed and incorporated, the velour is fully implanted at exit site. No redness, tenderness, drainage, foul odor or rash noted. Drive line anchor re-applied. Pt denies fever or chills. Next dressing change to be performed by caregiver [name] in a week. Patient given 8 weekly kits, 8 anchors and a pack of tegaderms for at home use.    Significant Events on VAD Support:    Device: Medtronic Therapies: on VF 240 VT 200 VVI: 40 Last check: 08/07/2022   BP & Labs:  Doppler 98    Hgb 11.6 - No S/S of bleeding. Specifically denies vaginal bleeding, melena/BRBPR or nosebleeds.    LDH stable at 206 with established baseline of 250 - 450. Denies tea-colored urine. No power elevations noted on interrogation.   Plan:  Resume Torsemide 20mg  every other day Please call VAD Coordinator if lightheadedness and dizziness occurs BMET 10 days Return to [facility] in 2 months for full visit with Dr. Sydell Axon RN,BSN [place] Coordinator  Office: [phone]  24/7 Pager: [phone]     ID:  [name], DOB December 11, 1970, MRN [id number]    Provider location: [place] [facility] Type of Visit: Established patient    PCP:  [name], MD    Cardiologist:  [name], MD  History of Present Illness: [name] is a 52 y.o. 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  She was followed for systolic CHF initially in [place]. Apparently her EF recovered to 35-40%. However, she was admitted to [facility] 09/19/13 for worsening dyspnea and CP. Coronaries were reportedly OK on LHC at that time at [facility]. Echo showed EF of 15-20% with four-chamber enlargement with moderate-severe TR/MR. RHC as below. Rheumatological w/u negative.  CPX in 9/16 showed  near-normal functional capacity.  Repeat echo in 6/17 shows that EF remains 20%. Repeat CPX in 1/18 was submaximal but probably only mild circulatory limitation.  Echo 8/18 showed EF 25-30%, severe LV dilation.  CPX in 6/19 showed low normal functional capacity.  Echo in 10/19 showed EF 20-25%, moderate LV dilation.    Zio patch in 2/20 with 11% PVCs and NSVT, amiodarone started. Zio patch in 3/20 with 13.3% PVCs and NSVT.  She saw Dr. [name], he thought that increased PVCs were electromechanical in nature due to worsening heart failure.  Repeat Zio patch on amiodarone in 1/21 showed 6.2% PVCs.   She was admitted in 4/20 with VT in setting of hypokalemia.    CPX in 9/20 showed mild functional limitation, more due to body habitus than heart failure.  Echo in 4/21 showed EF 20% with severe LV  dilation, diffuse hypokinesis, normal RV size and systolic function.   In 8/21, she had a shock for VF in the setting of hypokalemia.   Patient was admitted in 10/21 with CVA involving left MCA territory.  This was thought to be cardioembolic but LV thrombus was not visualized and no atrial fibrillation has been seen.  Echo in 10/21 showed EF 20-25%, severe LV dilation (no LV thrombus seen), mildly decreased RV systolic function, mild-moderate MR. She was taken by IR for thrombectomy and stent placement, now on ASA 81 and ticagrelor.   She had recurrent VT in 2/22, quinidine was added by EP.  Spironolactone and Coreg were decreased due to low BP and lightheadedness.    Echo in 4/22 showed EF 25% with severe LV dilation and global hypokinesis, normal RV, mild-moderate MR.   CPX (8/22) showed peak VO2 14.3, VE/VCO2 slope 42, RER 1.09 => mild to moderate HF limitation, elevated VE/VCO2 slope is likely false from end exercise hyperventilation. Restrictive lung physiology from obesity.   Patient was admitted in 3/23 with recurrent CHF exacerbation with low cardiac output.  She was also noted to be hyperthyroidism but  amiodarone had to be continued due to VT and poorly tolerated atrial fibrillation.  She required Impella 5.5 placement and eventual HM3 LVAD placement on 09/04/21.  Post-op course complicated by atrial fibrillation, malnutrition, and deconditioning.  She went to CIR prior to discharge.   Patient developed heavy vaginal bleeding in 7/23, she was seen in the ER and required 2 units PRBCs.  She was admitted in 8/23 and had hysterectomy + BSO, LVAD speed increased to 5900 rpm on ramp echo.  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    The patient was admitted overnight in 12/23 with severe posterior epistaxis requiring nasal pacing.   She returns for followup of CHF and LVAD.  Weight is up about 10 lbs.  She has not been able to get semaglutide as she does not have diabetes diagnosis.  No significant exertional dyspnea.  No lightheadedness.  No orthopnea/PND.    LVAD interrogation: Please see LVAD nursing note above.   Labs: (11/09/13): Dig level 0.5, K 3.6, Creatinine 0.83, pro-BNP 622 (12/19/13): K 3.6, creatinine 0.8 (7/15): K 4.1, creatinine 0.91 (8/15): K 3.9, creatinine 0.91 (10/15): K 4, creatinine 0.93, proBNP 129 (12/15): K 4, creatinine 0.9, digoxin 0.4 (5/16): K 4, creatinine 0.86 (9/16): K 3.6, creatinine 0.86, digoxin < 0.2 (5/17): K 4.1, creatinine 0.95, digoxin 0.3 (5/18): K 3.4, creatinine 0.95, TSH elevated but free T3 and free T4 normal.  (8/18): K 3.9, creatinine 0.87, BNP 108  (11/18): K 3.5, creatinine 1.04, digoxin 0.4 (1/19): LDL 91, HDL 47 (3/19): digoxin 0.7, K 4, creatinine 0.99 (4/19): TSH normal, LDL 95 (9/19): LDL 99, HDL 45, K 4, creatinine 0.96 (10/19): K 3.6, creatinine 0.85, digoxin 0.5 (2/20): LDL 103 (3/20): K 3.9, creatinine 1.05 (4/20): K 3.9, creatinine 1.23 => 1.15, TSH 8, digoxin 0.3, LFTs normal 6/20): K 4.3, creatinine 1.07, LFTs normal, digoxin 0.5 (7/20): K 3.8, creatinine 1.16 (8/20): digoxin < 0.2, LDL  135 (1/21): K 3.9, creatinine 1.05, LDL 171  (4/21): K 4.2, creatinine 0.95, LDL 127, TSH normal, LFTs normal (10/21): K 2.8, creatinine 1.21, LDL 130 (11/21): K 4.5, creatinine 1.3 => 1.2, LFTs normal, digoxin 1.3, TSH normal (3/22): K 4.1, creatinine 1.36 (4/22): hgb 10.8 (7/22): Transferrin 8%, digoxin 0.9, hgb 10.1, K 3.9, creatinine 1.42 (8/22): digoxin 0.4 (9/22): K  4.2, creatinine 1.32 (4/23): hgb 11, WBCs 13, K 3.9 => 3.8, creatinine 0.9 => 0.98, albumin 3.2, ALT 45, AST 31, LDH 272 => 244, TSH 0.235, free T4 1.84 (7/23): hgb 8, TSH normal 8/23): K 3.8, creatinine 1.15, hgb 11.4 (9/23): K 3.7, creatinine 1.36 (10/23): K 3.6, creatinine 1.42 (11/23): TSH normal (1/24): K 3.9, creatinine 1.17 (2/24): TSH 4.75, hgb 11.3, K 3.4, creatinine 1.35, LFTs normal  PMH: 1. OSA: Using CPAP.  2. PVCs:  - Zio patch 2/20: 11% PVCs - Zio patch 3/20: 13.3% PVCs - Zio patch 1/21: 6.2% PVCs 3. VT/VF: 8/21, in setting of hypokalemia.  2/22 VT, quinidine added.  4. 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  No LV thrombus visualized and atrial fibrillation has not been visualized.  - IR thrombectomy with stent placement in 10/21.  5. Chronic systolic CHF: Nonischemic cardiomyopathy:   - Coronary angiography 2015 at [facility]: Normal coronaries.  - Echo (8/15) with EF 20%, moderate LV dilation, diffuse hypokinesis, normal RV size with mildly decreased systolic function, mild MR.  - Echo (5/16) with EF 20%, spherical LV with diffuse hypokinesis, mild MR.  - Echo (6/17) with EF 20%, diffuse hypokinesis, moderate LV dilation, normal RV, mild to moderate MR.  - RHC (12/17): mean RA 5, PA 56/31, mean PCWP 24, CI 2, PVR 3.7 WU - Echo (8/18) with EF 25-30%, severe LV dilation, severe LAE.  - Echo (2/20) with EF 20-25%, moderate LV dilation, moderate MR.  - Echo (4/21) with EF 20-25%, severe LV dilation, mild-moderate MR, normal RV, PASP 26 mmHg.  - Echo (10/21) with EF 20-25%, severe LV dilation (no LV thrombus  seen), mildly decreased RV systolic function, mild-moderate MR. - CPX in 9/20 showed mild functional limitation, more due to body habitus than heart failure.  - CPX in 8/21: Peak VO2 11.6, VE/VCO2 32, RER 1.2.  Mild HF limitation.  - Echo (4/22): EF 25% with severe LV dilation and global hypokinesis, normal RV, mild-moderate MR.  - CPX (8/22): peak VO2 14.3, VE/VCO2 slope 42, RER 1.09 => mild to moderate HF limitation, elevated VE/VCO2 slope is likely false from end exercise hyperventilation. Restrictive lung physiology from obesity.  - HM3 LVAD placement 09/04/21.  6. PFTs (1/22): Near normal.  7. Fe deficiency anemia 8. 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  10. Left ovarian mass -  hysterectomy + BSO.  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      Current Outpatient Medications  Medication Sig Dispense Refill   ALPRAZolam (XANAX) 0.25 MG tablet Take 1 tablet (0.25 mg total) by mouth 2 (two) times daily as needed for anxiety. 20 tablet 0   amiodarone (PACERONE) 200 MG tablet Take 1 pill twice a day for 10 days and then resume a dose of 1 pill daily 90 tablet 3   aspirin EC 81 MG tablet Take 1 tablet (81 mg total) by mouth daily. Swallow whole. 90 tablet 3   dapagliflozin propanediol (FARXIGA) 10 MG TABS tablet Take 1 tablet (10 mg total) by mouth daily before breakfast. 90 tablet 3   methimazole (TAPAZOLE) 10 MG tablet Take 1 tablet (10 mg total) by mouth daily. (Patient taking differently: Take 5 mg by mouth daily.) 90 tablet 1   potassium chloride SA (KLOR-CON M) 20 MEQ tablet Take 3 tablets (60 mEq total) by mouth daily. 270 tablet 3   predniSONE (DELTASONE) 5 MG tablet Take 1 tablet (5 mg total) by mouth  daily with breakfast. (Patient taking differently: Take 2.5 mg by mouth daily with breakfast.) 30 tablet 6   quiniDINE gluconate 324 MG CR tablet Take 1 tablet (324 mg total) by mouth 2 (two) times daily.  60 tablet 6   rosuvastatin (CRESTOR) 40 MG tablet Take 1 tablet (40 mg total) by mouth daily. 90 tablet 3   sildenafil (REVATIO) 20 MG tablet Take 1 tablet (20 mg total) by mouth 3 (three) times daily. 90 tablet 6   spironolactone (ALDACTONE) 25 MG tablet Take 1 tablet (25 mg total) by mouth daily. Restart on 02/10/22. 45 tablet 3   Vitamin D, Ergocalciferol, (DRISDOL) 1.25 MG (50000 UNIT) CAPS capsule Take 1 capsule (50,000 Units total) by mouth every 7 (seven) days. (Patient taking differently: Take 5,000 Units by mouth daily at 12 noon.) 5 capsule 0   warfarin (COUMADIN) 2 MG tablet Take 2 tablets (4 mg total) by mouth daily at 4 PM. Take 4mg  daily  or as directed by LVAD Clinic. 180 tablet 5   Semaglutide,0.25 or 0.5MG /DOS, 2 MG/3ML SOPN 0.25 mg [place] weekly injection (Patient not taking: Reported on 09/08/2022) 3 mL 0   torsemide (DEMADEX) 20 MG tablet Take 1 tablet (20 mg total) by mouth every other day. Take 1 tablet (20 mg) by mouth as needed, or as directed by heart failure clinic. 180 tablet 3   No current facility-administered medications for this encounter.    Allergies:   Patient has no known allergies.   Social History:  The patient  reports that she has never smoked. She has never used smokeless tobacco. She reports that she does not drink alcohol and does not use drugs.   Family History:  The patient's family history includes Heart Problems in her mother; Heart disease in her maternal grandmother and mother; High blood pressure in her father; Hypertension in her father and mother; Obesity in her mother; Stroke in her father.   ROS:  Please see the history of present illness.   All other systems are personally reviewed and negative.   Exam:    MAP 74 General: Well appearing this am. NAD.  HEENT: Normal. Neck: Supple, JVP 8-9 cm. Carotids OK.  Cardiac:  Mechanical heart sounds with LVAD hum present.  Lungs:  CTAB, normal effort.  Abdomen:  NT, ND, no HSM. No bruits or masses. +BS   LVAD exit site: Well-healed and incorporated. Dressing dry and intact. No erythema or drainage. Stabilization device present and accurately applied. Driveline dressing changed daily per sterile technique. Extremities:  Warm and dry. No cyanosis, clubbing, rash, or edema.  Neuro:  Alert & oriented x 3. Cranial nerves grossly intact. Moves all 4 extremities w/o difficulty. Affect pleasant    Recent Labs: 09/17/2021: B Natriuretic Peptide 279.4 08/08/2022: Magnesium 2.2; TSH 4.750 09/08/2022: ALT 26; BUN 20; Creatinine, Ser 1.42; Hemoglobin 11.6; Platelets 351; Potassium 4.0; Sodium 139  Personally reviewed   Wt Readings from Last 3 Encounters:  08/26/22 108.9 kg (240 lb)  08/04/22 110.2 kg (243 lb)  07/04/22 109.3 kg (241 lb)    ASSESSMENT AND PLAN:   1. Chronic systolic HF: Nonischemic cardiomyopathy.  She has a Medtronic ICD. Cause for cardiomyopathy is uncertain, initially identified peri-partum (after son's birth), so cannot rule out peri-partum CMP.  On and off, she has had frequent PVCs.   RHC in 8/15 showed normal filling pressures and preserved cardiac output. Echo in 4/22 with EF 25%, severe LV dilation.  CPX (8/22) with mild-moderate HF limitation.  Echo  in 3/23 with EF < 20% with severe dilation, normal RV size with mildly decreased systolic function, moderate-severe functional MR with dilated IVC. 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 Impella 5.5 placed 08/29/21.  Patient had HM3 LVAD placed 09/04/21 and speed adjusted by ramp echo up to 5800 rpm and again up to 5900 rpm. LVAD interrogation shows stable parameters today.  NYHA class I-II, she is mildly volume overloaded on exam today.  - Start torsemide 20 mg daily. BMET today and in 10 days.  - Continue Farxiga 10 mg daily.  - Continue spironolactone 25 mg daily.   - She should continue ASA 81  mg daily with history of CVA.  - Continue sildenafil 20 mg tid  - Warfarin for VAD, goal INR 2-2.5.   2. 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  She had been on Levoxyl at home prior to 3/23 admission, noted to be hyperthyroid in 3/23.  This likely contributes to her CHF. She had recurrent VT and poorly tolerated AF which made it difficult for [name] to stop amiodarone. Initially treated w/ IV Solumedrol  -> transitioned to prednisone and methimazole.  Endocrinology following, prednisone being titrated down.  She is off amiodarone.  - Management per endocrinology, has appointment on 3/25.  3. PVCs/VT: EP consulted given complicated situation with hyperthyroidism and VT.  - Continue quinidine.  - Now off amiodarone.  4. CVA: CVA in 10/21, had thrombectomy with stent placement.  No LV thrombus noted and no atrial fibrillation noted.  - Would continue ASA 81 daily.   5. Anemia: Stable hgb recently.  - CBC today.  6. CKD stage 3: Creatinine recently improved.   - BMET today.  7. Mitral regurgitation: Moderate-severe functional MR pre-LVAD.  Unlikely to be Mitraclip candidate with severely dilated LV (>7 cm). Mitral regurgitation was mild on 8/23 echo.  8. Left ovarian mass: Hysterectomy + BSO.  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   9. Atrial fibrillation: Tolerates poorly.   - Watch closely off amiodarone.  10. Epistaxis: No recurrence.   Followup in 2 months.   [name], MD  09/08/2022  [facility] [address] [facility] [place] [address] [phone] (office) [phone] (fax)

## 2022-09-09 ENCOUNTER — Other Ambulatory Visit: Payer: Self-pay

## 2022-09-09 ENCOUNTER — Other Ambulatory Visit (HOSPITAL_COMMUNITY): Payer: Self-pay

## 2022-09-12 ENCOUNTER — Other Ambulatory Visit (HOSPITAL_COMMUNITY): Payer: Self-pay | Admitting: Unknown Physician Specialty

## 2022-09-12 DIAGNOSIS — Z7901 Long term (current) use of anticoagulants: Secondary | ICD-10-CM

## 2022-09-12 DIAGNOSIS — Z95811 Presence of heart assist device: Secondary | ICD-10-CM

## 2022-09-15 ENCOUNTER — Ambulatory Visit: Payer: Medicare Other | Admitting: Internal Medicine

## 2022-09-15 ENCOUNTER — Encounter: Payer: Self-pay | Admitting: Internal Medicine

## 2022-09-15 VITALS — Ht 63.0 in | Wt 250.0 lb

## 2022-09-15 DIAGNOSIS — Z95811 Presence of heart assist device: Secondary | ICD-10-CM | POA: Diagnosis not present

## 2022-09-15 DIAGNOSIS — Z7901 Long term (current) use of anticoagulants: Secondary | ICD-10-CM

## 2022-09-15 DIAGNOSIS — E064 Drug-induced thyroiditis: Secondary | ICD-10-CM

## 2022-09-15 DIAGNOSIS — T462X5A Adverse effect of other antidysrhythmic drugs, initial encounter: Secondary | ICD-10-CM | POA: Diagnosis not present

## 2022-09-15 NOTE — Progress Notes (Unsigned)
Name: [name]  MRN/ DOB: [id number], 08-15-1970    Age/ Sex: 52 y.o., female    PCP: Patient, No Pcp Per   Reason for Endocrinology Evaluation: Hyperthyroid     Date of Initial Endocrinology Evaluation: 10/08/2021    HPI: [name] is a 52 y.o. female with a past medical history of CHF, nonischemic cardiomyopathy (S/P ICD placement and LVAD), CVA, pulmonary HTN, paroxysmal A-fib. The patient presented for initial endocrinology clinic visit on 4/18/2023for consultative assistance with her Hyperthyroidism.   Patient with extensive cardiac history to include A-fib, and nonischemic cardiomyopathy (s/p insertion of ICD) as well as left MCA territory CVA, s/p thrombectomy and stent placement.   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 08/2021 No FH of thyroid disease No Biotin intake  No prior thyroid /neck sx   She was already on methimazole and prednisone on her initial visit with me which we have adjusted   SUBJECTIVE:    Today (09/16/22): [name] is here for follow-up on hyperthyroidism   She continues to follow-up with cardiology for VAD She has been participating with the [facility] [facility]  Weight has decreased  Denies local neck swelling  She continues with  Palpitations , she was restarted on Amiodarone  Has occasional constipation , on fiber     Methimazole 10  mg, half  tablet  daily Prednisone 5 mg, half a tab daily with breakfast  HISTORY:  Past  Medical History:  Past Medical History:  Diagnosis Date   Anemia    Automatic implantable cardioverter-defibrillator in situ    B12 deficiency    Cancer ([place])    Chronic CHF (congestive heart failure) ([place])    a. EF 15-20% b. RHC (09/2013) RA 14, RV 57/22, PA 64/36 (48), PCWP 18, FIck CO/CI 3.7/1.6, PVR 8.1 WU, PA sat 47%    Heart valve problem    History of heart attack    History of hypothyroidism    History of stomach ulcers    HLD (hyperlipidemia)    Hyperthyroidism    Hypotension    Hypothyroidism    Morbid obesity ([place])    Myocardial infarction ([place]) 08/2013   Nocturnal dyspnea    Nonischemic cardiomyopathy ([facility])    Palpitations    Sinus tachycardia    Sleep apnea    Snoring-prob OSA 09/04/2011   SOB (shortness of breath)    S[employer] Fidel[employer] ICD lead RECALL  6949    Stroke ([place]) 2021   some residual right-sided weakness   UARS (upper airway resistance syndrome) 09/04/2011   HST 12/2013:  AHI 4/hr (numerous episodes of airflow reduction that did not have concomitant desaturation)    Vitamin D deficiency    Past Surgical History:  Past Surgical History:  Procedure Laterality Date   BREATH TEK H PYLORI N/A 11/09/2014   Procedure: BREATH TEK H PYLORI;  Surgeon: Greer Pickerel, MD;  Location: [facility];  Service: General;  Laterality: N/A;   CARDIAC CATHETERIZATION  ~ 2006;  09/2013   CARDIAC CATHETERIZATION N/A 05/23/2016   Procedure: Right Heart Cath;  Surgeon: Larey Dresser, MD;  Location: [facility];  Service: Cardiovascular;  Laterality: N/A;   CARDIAC DEFIBRILLATOR PLACEMENT  2006; 12/26/2013   Medtronic [id number]; 12-2013 ICD gen change and RV lead revision with new 6935 RV lead by Dr [name]   CENTRAL LINE INSERTION  08/28/2021   Procedure: CENTRAL LINE INSERTION;  Surgeon: Larey Dresser, MD;  Location: [place] [facility];  Service: Cardiovascular;;   CESAREAN SECTION  1999   IMPLANTABLE CARDIOVERTER DEFIBRILLATOR GENERATOR CHANGE N/A 12/26/2013   Procedure:  IMPLANTABLE CARDIOVERTER DEFIBRILLATOR GENERATOR CHANGE;  Surgeon: Deboraha Sprang, MD;  [facility];  Service: Cardiovascular;  Laterality: N/A;   INSERTION OF IMPLANTABLE LEFT VENTRICULAR ASSIST DEVICE N/A 09/03/2021   Procedure: INSERTION OF IMPLANTABLE LEFT VENTRICULAR ASSIST DEVICE/ Heartmate 3;  Surgeon: Dahlia Byes, MD;  Location: [place];  Service: Open Heart Surgery;  Laterality: N/A;   IR CT HEAD LTD  04/17/2020   IR CT HEAD LTD  04/17/2020   IR INTRA CRAN STENT  04/17/2020   IR PERCUTANEOUS ART THROMBECTOMY/INFUSION INTRACRANIAL INC DIAG ANGIO  04/17/2020   IR RADIOLOGIST EVAL & MGMT  06/05/2020   LEAD REVISION N/A 12/26/2013   Procedure: LEAD REVISION;  Surgeon: Deboraha Sprang, MD;  Location: [facility] CATH LAB;  Service: Cardiovascular;  Laterality: N/A;   PLACEMENT OF IMPELLA LEFT VENTRICULAR ASSIST DEVICE Right 08/29/2021   Procedure: PLACEMENT OF IMPELLA 5.5 LEFT VENTRICULAR ASSIST DEVICE;  Surgeon: Gaye Pollack, MD;  Location: [place];  Service: Open Heart Surgery;  Laterality: Right;   RADIOLOGY WITH ANESTHESIA N/A 04/17/2020   Procedure: Code Stroke;  Surgeon: Luanne Bras, MD;  Location: [place];  Service: Radiology;  Laterality: N/A;   REMOVAL OF IMPELLA LEFT VENTRICULAR ASSIST DEVICE N/A 09/03/2021   Procedure: REMOVAL OF IMPELLA LEFT VENTRICULAR ASSIST DEVICE;  Surgeon: Dahlia Byes, MD;  Location: [place];  Service: Open Heart Surgery;  Laterality: N/A;   RIGHT HEART CATH N/A 08/28/2021   Procedure: RIGHT HEART CATH;  Surgeon: Larey Dresser, MD;  Location: [place] [facility];  Service: Cardiovascular;  Laterality: N/A;   RIGHT HEART CATHETERIZATION N/A 02/15/2014   Procedure: RIGHT HEART CATH;  Surgeon: Larey Dresser, MD;  Location: [facility] CATH LAB;  Service: Cardiovascular;  Laterality: N/A;   ROBOTIC ASSISTED TOTAL HYSTERECTOMY WITH BILATERAL SALPINGO OOPHERECTOMY N/A 02/05/2022   Procedure: XI ROBOTIC ASSISTED TOTAL HYSTERECTOMY WITH BILATERAL SALPINGO OOPHORECTOMY,  CYSTOSCOPY, UTERUS GREATER THAN 250 GRAMS;  Surgeon: Lafonda Mosses, MD;  Location: [place];  Service: Gynecology;  Laterality: N/A;   TEE WITHOUT CARDIOVERSION N/A 08/29/2021   Procedure: TRANSESOPHAGEAL ECHOCARDIOGRAM (TEE);  Surgeon: Gaye Pollack, MD;  Location: [place];  Service: Open Heart Surgery;  Laterality: N/A;   TEE WITHOUT CARDIOVERSION N/A 09/03/2021   Procedure: TRANSESOPHAGEAL ECHOCARDIOGRAM (TEE);  Surgeon: Dahlia Byes, MD;  Location: [place];  Service: Open Heart Surgery;  Laterality: N/A;   TUBAL LIGATION  1999    Social History:  reports that she has never smoked. She has never used smokeless tobacco. She reports that she does not drink alcohol and does not use drugs. Family History: family history includes Heart Problems in her mother; Heart disease in her maternal grandmother and mother; High blood pressure in her father; Hypertension in her father and mother; Obesity in her mother; Stroke in her father.   HOME MEDICATIONS: Allergies as of 09/15/2022  No Known Allergies      Medication List        Accurate as of September 15, 2022 11:59 PM. If you have any questions, ask your nurse or doctor.          STOP taking these medications    predniSONE 5 MG tablet Commonly known as: DELTASONE Stopped by: Dorita Sciara, MD       TAKE these medications    ALPRAZolam 0.25 MG tablet Commonly known as: XANAX Take 1 tablet (0.25 mg total) by mouth 2 (two) times daily as needed for anxiety.   amiodarone 200 MG tablet Commonly known as: PACERONE Take 1 pill twice a day for 10 days and then resume a dose of 1 pill daily   aspirin EC 81 MG tablet Take 1 tablet (81 mg total) by mouth daily. Swallow whole.   dapagliflozin propanediol 10 MG Tabs tablet Commonly known as: FARXIGA Take 1 tablet (10 mg total) by mouth daily before breakfast.   methimazole 5 MG tablet Commonly known as: TAPAZOLE Take 1 tablet (5 mg total) by mouth as directed. Methimazole 5  mg, four days a week What changed:  medication strength how much to take when to take this additional instructions Changed by: Dorita Sciara, MD   potassium chloride SA 20 MEQ tablet Commonly known as: KLOR-CON M Take 3 tablets (60 mEq total) by mouth daily.   quiniDINE gluconate 324 MG CR tablet Take 1 tablet (324 mg total) by mouth 2 (two) times daily.   rosuvastatin 40 MG tablet Commonly known as: CRESTOR Take 1 tablet (40 mg total) by mouth daily.   Semaglutide(0.25 or 0.5MG /DOS) 2 MG/3ML Sopn 0.25 mg [place] weekly injection   sildenafil 20 MG tablet Commonly known as: REVATIO Take 1 tablet (20 mg total) by mouth 3 (three) times daily.   spironolactone 25 MG tablet Commonly known as: ALDACTONE Take 1 tablet (25 mg total) by mouth daily. Restart on 02/10/22.   torsemide 20 MG tablet Commonly known as: DEMADEX Take 1 tablet (20 mg total) by mouth every other day. Take 1 tablet (20 mg) by mouth as needed, or as directed by heart failure clinic.   Vitamin D (Ergocalciferol) 1.25 MG (50000 UNIT) Caps capsule Commonly known as: DRISDOL Take 1 capsule (50,000 Units total) by mouth every 7 (seven) days. What changed:  how much to take when to take this   warfarin 2 MG tablet Commonly known as: COUMADIN Take as directed by the anticoagulation clinic. If you are unsure how to take this medication, talk to your nurse or doctor. Original instructions: Take 2 tablets (4 mg total) by mouth daily at 4 PM. Take 4mg  daily  or as directed by LVAD Clinic.          REVIEW OF SYSTEMS: A comprehensive ROS was conducted with the patient and is negative except as per HPI    OBJECTIVE:  VS: Ht 5\' 3"  (1.6 m)   Wt 250 lb (113.4 kg)   BMI 44.29 kg/m    Wt Readings from Last 3 Encounters:  09/15/22 250 lb (113.4 kg)  08/26/22 240 lb (108.9 kg)  08/04/22 243 lb (110.2 kg)     EXAM: General: Pt appears well and is in NAD  Eyes: External eye exam normal without stare, lid  lag or exophthalmos.  EOM intact.    Neck: General: Supple without adenopathy. Thyroid: Thyroid size normal.  No goiter or nodules appreciated.   Lungs: Clear with good BS bilat  with no rales, rhonchi, or wheezes  Extremities:  BL LE: No pretibial edema normal ROM and strength.  Mental Status: Judgment, insight: Intact Orientation: Oriented to time, place, and person Mood and affect: No depression, anxiety, or agitation     DATA REVIEWED:   Latest Reference Range & Units 09/15/22 09:50  TSH 0.35 - 5.50 uIU/mL 6.93 (H)  Triiodothyronine (T3) 76 - 181 ng/dL 90  T4,Free(Direct) 0.60 - 1.60 ng/dL 1.01     Latest Reference Range & Units 03/27/22 10:10  Sodium 135 - 145 mmol/L 139  Potassium 3.5 - 5.1 mmol/L 3.6  Chloride 98 - 111 mmol/L 104  CO2 22 - 32 mmol/L 25  Glucose 70 - 99 mg/dL 90  BUN 6 - 20 mg/dL 26 (H)  Creatinine 0.44 - 1.00 mg/dL 1.42 (H)  Calcium 8.9 - 10.3 mg/dL 9.7  Anion gap 5 - 15  10  Alkaline Phosphatase 38 - 126 U/L 72  Albumin 3.5 - 5.0 g/dL 4.2  AST 15 - 41 U/L 23  ALT 0 - 44 U/L 19  Total Protein 6.5 - 8.1 g/dL 7.4  Total Bilirubin 0.3 - 1.2 mg/dL 0.5  PREALBUMIN 18 - 38 mg/dL 23  GFR, Estimated >60 mL/min 45 (L)     Latest Reference Range & Units 03/27/22 10:10  WBC 4.0 - 10.5 K/uL 6.2  RBC 3.87 - 5.11 MIL/uL 4.88  Hemoglobin 12.0 - 15.0 g/dL 11.7 (L)  HCT 36.0 - 46.0 % 38.2  MCV 80.0 - 100.0 fL 78.3 (L)  MCH 26.0 - 34.0 pg 24.0 (L)  MCHC 30.0 - 36.0 g/dL 30.6  RDW 11.5 - 15.5 % 15.5  Platelets 150 - 400 K/uL 264  nRBC 0.0 - 0.2 % 0.0      Thyroid ultrasound 08/27/2021  Right thyroid lobe is moderately heterogeneous with at least 2 small nodules, largest measuring 0.5 cm. These nodules do not meet criteria for biopsy or dedicated follow-up.   No definite thyroid tissue in the expected region of the left thyroid lobe. 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   IMPRESSION: 1. 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 2. Thyroid tissue is heterogeneous with small nodules. These nodules do not meet criteria for biopsy or dedicated follow-up.     ASSESSMENT/PLAN/RECOMMENDATIONS:   Amiodarone-induced thyroid disease:   -The patient was initially diagnosed with hypothyroidism and was on LT-for replacement for decades prior to developing hyperthyroidism in 08/2021 -The patient was on amiodarone from 07/2018 until 09/2021, restarted again in 08/2022 -LT-4 replacement was discontinued and she has been on methimazole since 08/26/2021 as well as prednisone -Her TSH is elevated today, will reduce methimazole and discontinue prednisone as below   Medications : Decrease methimazole 5 mg, 1 tablet 4 days weekly Stop prednisone   Follow-up in 3 months   Signed electronically by: Mack Guise, MD  [facility] Endocrinology  [facility] [address] [place], [place] [address] Phone: [phone] FAX: [phone]   CC: Patient, No Pcp Per No address on file Phone: None Fax: None   Return to Endocrinology clinic as below: Future Appointments  Date Time Provider [place]  09/16/2022 12:30 PM [place] [facility] None  09/19/2022  4:00 PM Lafonda Mosses, MD [facility] None  09/24/2022  9:40 AM Irene Shipper, MD [facility]  09/25/2022 12:20 PM Bowen, Collene Leyden, DO [facility] None  11/06/2022  7:05 AM [facility] DEVICE REMOTES [facility]  11/10/2022  10:00 AM Larey Dresser, MD [facility] None  11/28/2022 11:30 AM Naveh Rickles, Melanie Crazier, MD [facility] None  01/02/2023  8:40 AM Tomasita Morrow, NP [facility]  02/05/2023  7:05 AM [facility] DEVICE REMOTES [facility]  05/07/2023  7:05 AM [facility] DEVICE REMOTES [facility]  08/06/2023  7:05 AM [facility] DEVICE REMOTES [facility]  11/05/2023  7:05 AM [facility] DEVICE REMOTES [facility]

## 2022-09-16 ENCOUNTER — Telehealth (HOSPITAL_COMMUNITY): Payer: Self-pay | Admitting: Unknown Physician Specialty

## 2022-09-16 ENCOUNTER — Ambulatory Visit (HOSPITAL_COMMUNITY)
Admission: RE | Admit: 2022-09-16 | Discharge: 2022-09-16 | Disposition: A | Discharge status: Home / Self Care | Payer: Medicare Other | Source: Ambulatory Visit | Attending: Internal Medicine | Admitting: Internal Medicine

## 2022-09-16 DIAGNOSIS — I5022 Chronic systolic (congestive) heart failure: Secondary | ICD-10-CM | POA: Diagnosis not present

## 2022-09-16 LAB — BASIC METABOLIC PANEL
Anion gap: 8 (ref 5–15)
BUN: 20 mg/dL (ref 6–20)
CO2: 24 mmol/L (ref 22–32)
Calcium: 9.6 mg/dL (ref 8.9–10.3)
Chloride: 106 mmol/L (ref 98–111)
Creatinine, Ser: 1.26 mg/dL — ABNORMAL HIGH (ref 0.44–1.00)
GFR, Estimated: 52 mL/min — ABNORMAL LOW (ref >60)
Glucose, Bld: 74 mg/dL (ref 70–99)
Potassium: 4.4 mmol/L (ref 3.5–5.1)
Sodium: 138 mmol/L (ref 135–145)

## 2022-09-16 LAB — T3: T3, Total: 90 ng/dL (ref 76–181)

## 2022-09-16 LAB — T4, FREE: Free T4: 1.01 ng/dL (ref 0.60–1.60)

## 2022-09-16 LAB — TSH: TSH: 6.93 u[IU]/mL — ABNORMAL HIGH (ref 0.35–5.50)

## 2022-09-16 MED ORDER — METHIMAZOLE 5 MG PO TABS: 5.0000 mg | ORAL_TABLET | ORAL | 1 refills | Status: DC

## 2022-09-16 NOTE — Telephone Encounter (Signed)
Informed pt of normal lab results. She states she is doing well with Torsemide every other day. Pt denies any dizziness or fluid retention.  Tanda Rockers RN, BSN VAD Coordinator 24/7 Pager [phone]

## 2022-09-17 ENCOUNTER — Other Ambulatory Visit: Payer: Self-pay

## 2022-09-18 ENCOUNTER — Ambulatory Visit (HOSPITAL_COMMUNITY): Payer: Self-pay | Admitting: Pharmacist

## 2022-09-18 ENCOUNTER — Other Ambulatory Visit: Payer: Self-pay

## 2022-09-18 ENCOUNTER — Other Ambulatory Visit: Payer: Self-pay | Admitting: Adult Health

## 2022-09-18 LAB — POCT INR: INR: 1.8 — AB (ref 2.0–3.0)

## 2022-09-18 NOTE — Progress Notes (Signed)
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  Interval History: Patient reports overall doing well.  Is having bothersome hot flashes.  Has had some weight gain recently.  Denies any vaginal bleeding or discharge.  Denies any abdominal or pelvic pain.  Reports baseline bowel bladder function.  Past Medical/Surgical History: Past Medical History:  Diagnosis Date   Anemia    Automatic implantable cardioverter-defibrillator in situ    B12 deficiency    Cancer ([place])    Chronic CHF (congestive heart failure) (HCC)    a. EF 15-20% b. RHC (09/2013) RA 14, RV 57/22, PA 64/36 (48), PCWP 18, FIck CO/CI 3.7/1.6, PVR 8.1 WU, PA sat 47%    Heart valve problem    History of heart attack    History of hypothyroidism    History of stomach ulcers    HLD (hyperlipidemia)    Hyperthyroidism    Hypotension    Hypothyroidism    Morbid obesity ([place])    Myocardial infarction ([place]) 08/2013   Nocturnal dyspnea    Nonischemic cardiomyopathy (HCC)    Palpitations    Sinus tachycardia    Sleep apnea    Snoring-prob OSA 09/04/2011   SOB (shortness of breath)    Sprint Fidelis ICD lead RECALL  6949    Stroke ([place]) 2021   some residual right-sided weakness   UARS (upper airway resistance syndrome) 09/04/2011   HST 12/2013:  AHI 4/hr (numerous episodes of airflow reduction that did not have concomitant desaturation)    Vitamin D deficiency     Past Surgical History:  Procedure Laterality Date   BREATH TEK H PYLORI N/A 11/09/2014   Procedure: BREATH TEK H PYLORI;  Surgeon: Greer Pickerel, MD;  Location: [facility];  Service: General;  Laterality: N/A;   CARDIAC CATHETERIZATION  ~ 2006; 09/2013   CARDIAC CATHETERIZATION N/A 05/23/2016   Procedure:  Right Heart Cath;  Surgeon: Larey Dresser, MD;  Location: [place] [facility];  Service: Cardiovascular;  Laterality: N/A;   CARDIAC DEFIBRILLATOR PLACEMENT  2006; 12/26/2013   Medtronic [id number]; 12-2013 ICD gen change and RV lead revision with new 6935 RV lead by Dr [name]   CENTRAL LINE INSERTION  08/28/2021   Procedure: CENTRAL LINE INSERTION;  Surgeon: Larey Dresser, MD;  Location: [place] [facility];  Service: Cardiovascular;;   CESAREAN SECTION  1999   IMPLANTABLE CARDIOVERTER DEFIBRILLATOR GENERATOR CHANGE N/A 12/26/2013   Procedure: IMPLANTABLE CARDIOVERTER DEFIBRILLATOR GENERATOR CHANGE;  Surgeon: Deboraha Sprang, MD;  Location: [facility] CATH LAB;  Service: Cardiovascular;  Laterality: N/A;   INSERTION OF IMPLANTABLE LEFT VENTRICULAR ASSIST DEVICE N/A 09/03/2021   Procedure: INSERTION OF IMPLANTABLE LEFT VENTRICULAR ASSIST DEVICE/ Heartmate 3;  Surgeon: Dahlia Byes, MD;  Location: [place];  Service: Open Heart Surgery;  Laterality: N/A;   IR CT HEAD LTD  04/17/2020   IR CT HEAD LTD  04/17/2020   IR INTRA CRAN STENT  04/17/2020   IR PERCUTANEOUS ART THROMBECTOMY/INFUSION INTRACRANIAL INC DIAG ANGIO  04/17/2020   IR RADIOLOGIST EVAL & MGMT  06/05/2020   LEAD REVISION N/A 12/26/2013   Procedure: LEAD REVISION;  Surgeon: Deboraha Sprang, MD;  Location: [facility] CATH LAB;  Service: Cardiovascular;  Laterality: N/A;   PLACEMENT OF IMPELLA LEFT VENTRICULAR ASSIST DEVICE Right 08/29/2021  Procedure: PLACEMENT OF IMPELLA 5.5 LEFT VENTRICULAR ASSIST DEVICE;  Surgeon: Gaye Pollack, MD;  Location: [place];  Service: Open Heart Surgery;  Laterality: Right;   RADIOLOGY WITH ANESTHESIA N/A 04/17/2020   Procedure: Code Stroke;  Surgeon: Luanne Bras, MD;  Location: [place];  Service: Radiology;  Laterality: N/A;   REMOVAL OF IMPELLA LEFT VENTRICULAR ASSIST DEVICE N/A 09/03/2021   Procedure: REMOVAL OF IMPELLA LEFT VENTRICULAR ASSIST DEVICE;  Surgeon: Dahlia Byes, MD;  Location: [place];  Service: Open  Heart Surgery;  Laterality: N/A;   RIGHT HEART CATH N/A 08/28/2021   Procedure: RIGHT HEART CATH;  Surgeon: Larey Dresser, MD;  Location: [place] CV LAB;  Service: Cardiovascular;  Laterality: N/A;   RIGHT HEART CATHETERIZATION N/A 02/15/2014   Procedure: RIGHT HEART CATH;  Surgeon: Larey Dresser, MD;  Location: [facility] CATH LAB;  Service: Cardiovascular;  Laterality: N/A;   ROBOTIC ASSISTED TOTAL HYSTERECTOMY WITH BILATERAL SALPINGO OOPHERECTOMY N/A 02/05/2022   Procedure: XI ROBOTIC ASSISTED TOTAL HYSTERECTOMY WITH BILATERAL SALPINGO OOPHORECTOMY, CYSTOSCOPY, UTERUS GREATER THAN 250 GRAMS;  Surgeon: Lafonda Mosses, MD;  Location: [place];  Service: Gynecology;  Laterality: N/A;   TEE WITHOUT CARDIOVERSION N/A 08/29/2021   Procedure: TRANSESOPHAGEAL ECHOCARDIOGRAM (TEE);  Surgeon: Gaye Pollack, MD;  Location: [place];  Service: Open Heart Surgery;  Laterality: N/A;   TEE WITHOUT CARDIOVERSION N/A 09/03/2021   Procedure: TRANSESOPHAGEAL ECHOCARDIOGRAM (TEE);  Surgeon: Dahlia Byes, MD;  Location: [place];  Service: Open Heart Surgery;  Laterality: N/A;   TUBAL LIGATION  1999    Family History  Problem Relation Age of Onset   Hypertension Mother    Heart disease Mother    Obesity Mother    Heart Problems Mother    Hypertension Father    High blood pressure Father    Stroke Father    Heart disease Maternal Grandmother    Colon cancer Neg Hx    Breast cancer Neg Hx    Ovarian cancer Neg Hx    Endometrial cancer Neg Hx    Pancreatic cancer Neg Hx    Prostate cancer Neg Hx     Social History   Socioeconomic History   Marital status: Married    Spouse name: Mardie Pagano   Number of children: 2   Years of education: Not on file   Highest education level: Not on file  Occupational History   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  Tobacco Use   Smoking status: Never   Smokeless tobacco: Never  Vaping Use   Vaping Use: Never used  Substance and Sexual Activity   Alcohol  use: No   Drug use: No   Sexual activity: Not Currently  Other Topics Concern   Not on file  Social History Narrative   [place] sometimes   Right handed   Social Determinants of Health   Financial Resource Strain: Low Risk  (08/28/2021)   Overall Financial Resource Strain (CARDIA)    Difficulty of Paying Living Expenses: Not very hard  Food Insecurity: No Food Insecurity (08/28/2021)   Hunger Vital Sign    Worried About Running Out of Food in the Last Year: Never true    Ran Out of Food in the Last Year: Never true  Transportation Needs: No Transportation Needs (08/28/2021)   PRAPARE - Hydrologist (Medical): No    Lack of Transportation (Non-Medical): No  Physical Activity: Not on file  Stress: Not on file  Social Connections: Not on file    Current Medications:  Current Outpatient Medications:    ALPRAZolam (XANAX) 0.25 MG tablet, Take 1 tablet (0.25 mg total) by mouth 2 (two) times daily as needed for anxiety., Disp: 20 tablet, Rfl: 0   amiodarone (PACERONE) 200 MG tablet, Take 1 pill twice a day for 10 days and then resume a dose of 1 pill daily, Disp: 90 tablet, Rfl: 3   aspirin EC 81 MG tablet, Take 1 tablet (81 mg total) by mouth daily. Swallow whole., Disp: 90 tablet, Rfl: 3   dapagliflozin propanediol (FARXIGA) 10 MG TABS tablet, Take 1 tablet (10 mg total) by mouth daily before breakfast., Disp: 90 tablet, Rfl: 3   estradiol (CLIMARA) 0.0375 mg/24hr patch, Place 1 patch (0.0375 mg total) onto the skin once a week., Disp: 4 patch, Rfl: 12   methimazole (TAPAZOLE) 5 MG tablet, Take 1 tablet (5 mg total) by mouth as directed. Methimazole 5 mg, four days a week, Disp: 48 tablet, Rfl: 1   potassium chloride SA (KLOR-CON M) 20 MEQ tablet, Take 3 tablets (60 mEq total) by mouth daily., Disp: 270 tablet, Rfl: 3   quiniDINE gluconate 324 MG CR tablet, Take 1 tablet (324 mg total) by mouth 2 (two) times daily., Disp: 60 tablet, Rfl: 6   rosuvastatin (CRESTOR)  40 MG tablet, Take 1 tablet (40 mg total) by mouth daily., Disp: 90 tablet, Rfl: 3   sildenafil (REVATIO) 20 MG tablet, Take 1 tablet (20 mg total) by mouth 3 (three) times daily., Disp: 90 tablet, Rfl: 6   spironolactone (ALDACTONE) 25 MG tablet, Take 1 tablet (25 mg total) by mouth daily. Restart on 02/10/22., Disp: 45 tablet, Rfl: 3   torsemide (DEMADEX) 20 MG tablet, Take 1 tablet (20 mg total) by mouth every other day. Take 1 tablet (20 mg) by mouth as needed, or as directed by heart failure clinic., Disp: 180 tablet, Rfl: 3   Vitamin D, Ergocalciferol, (DRISDOL) 1.25 MG (50000 UNIT) CAPS capsule, Take 1 capsule (50,000 Units total) by mouth every 7 (seven) days. (Patient taking differently: Take 5,000 Units by mouth daily at 12 noon.), Disp: 5 capsule, Rfl: 0   warfarin (COUMADIN) 2 MG tablet, Take 2 tablets (4 mg total) by mouth daily at 4 PM. Take 4mg  daily  or as directed by LVAD Clinic., Disp: 180 tablet, Rfl: 5   Semaglutide,0.25 or 0.5MG /DOS, 2 MG/3ML SOPN, 0.25 mg Greer weekly injection (Patient not taking: Reported on 09/17/2022), Disp: 3 mL, Rfl: 0  Review of Systems: + Hot flashes, weight gain, anxiety Denies appetite changes, fevers, chills, fatigue. Denies hearing loss, neck lumps or masses, mouth sores, ringing in ears or voice changes. Denies cough or wheezing.  Denies shortness of breath. Denies chest pain or palpitations. Denies leg swelling. Denies abdominal distention, pain, blood in stools, constipation, diarrhea, nausea, vomiting, or early satiety. Denies pain with intercourse, dysuria, frequency, hematuria or incontinence. Denies pelvic pain, vaginal bleeding or vaginal discharge.   Denies joint pain, back pain or muscle pain/cramps. Denies itching, rash, or wounds. Denies dizziness, headaches, numbness or seizures. Denies swollen lymph nodes or glands, denies easy bruising or bleeding. Denies depression, confusion, or decreased concentration.  Physical Exam: BP (!)  105/56 (BP Location: Right Arm, Patient Position: Sitting)   Pulse 88   Temp 98 F (36.7 C) (Oral)   Resp 18   Ht 5\' 3"  (1.6 m)   Wt 253 lb (114.8 kg)   SpO2 100%   BMI 44.82  kg/m  General: Alert, oriented, no acute distress. HEENT: Normocephalic, atraumatic, sclera anicteric. Chest: Unlabored breathing on room air. Abdomen: Obese, soft, nontender.  Normoactive bowel sounds.  No masses or hepatosplenomegaly appreciated.  Well-healed incisions. Extremities: Grossly normal range of motion.  Warm, well perfused.  No edema bilaterally. Skin: No rashes or lesions noted. Lymphatics: No cervical, supraclavicular, or inguinal adenopathy. GU: Normal appearing external genitalia without erythema, excoriation, or lesions.  Speculum exam reveals cuff intact, no lesions or atypical vascularity.  Bimanual exam reveals no masses or nodularity.  Laboratory & Radiologic Studies: None new  Assessment & Plan: [name] is a 52 y.o. 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  Patient is overall doing well, NED on exam today.  Inhibin B performed today.  We discussed bothersome hot flashes.  Discussed nonhormonal and hormonal management.  Reviewed possibility of starting Effexor, gabapentin, or lower dose estrogen replacement therapy.  After discussion, the patient would like to start with some low-dose estrogen replacement therapy.  Prescription for a patch sent in today.  And that she was premenopausal before the surgery and had a small tumor that can sometimes create estrogen, I think any risk related to VTE for her is negligible with low-dose replacement.   We will continue with surveillance visits at this time and an interval of every 6 months.  These will alternate between myself and Melissa.  We discussed signs and symptoms that would be concerning for recurrence of granulosa cell tumor.  I stressed the importance of calling if she develops any of these.   22 minutes of  total time was spent for this patient encounter, including preparation, face-to-face counseling with the patient and coordination of care, and documentation of the encounter.  Jeral Pinch, MD  Division of Gynecologic Oncology  Department of Obstetrics and Gynecology  [facility]

## 2022-09-19 ENCOUNTER — Other Ambulatory Visit (HOSPITAL_COMMUNITY): Payer: Self-pay

## 2022-09-19 ENCOUNTER — Encounter: Payer: Self-pay | Admitting: Gynecologic Oncology

## 2022-09-19 ENCOUNTER — Inpatient Hospital Stay: Payer: Medicare Other

## 2022-09-19 ENCOUNTER — Inpatient Hospital Stay: Payer: Medicare Other | Attending: Gynecologic Oncology | Admitting: Gynecologic Oncology

## 2022-09-19 VITALS — BP 105/56 | HR 88 | Temp 98.0°F | Resp 18 | Ht 63.0 in | Wt 253.0 lb

## 2022-09-19 DIAGNOSIS — N951 Menopausal and female climacteric states: Secondary | ICD-10-CM | POA: Insufficient documentation

## 2022-09-19 DIAGNOSIS — Z90722 Acquired absence of ovaries, bilateral: Secondary | ICD-10-CM | POA: Insufficient documentation

## 2022-09-19 DIAGNOSIS — D3910 Neoplasm of uncertain behavior of unspecified ovary: Secondary | ICD-10-CM

## 2022-09-19 DIAGNOSIS — Z95811 Presence of heart assist device: Secondary | ICD-10-CM

## 2022-09-19 DIAGNOSIS — Z90710 Acquired absence of both cervix and uterus: Secondary | ICD-10-CM | POA: Diagnosis not present

## 2022-09-19 DIAGNOSIS — R232 Flushing: Secondary | ICD-10-CM

## 2022-09-19 DIAGNOSIS — Z86018 Personal history of other benign neoplasm: Secondary | ICD-10-CM | POA: Insufficient documentation

## 2022-09-19 DIAGNOSIS — N9489 Other specified conditions associated with female genital organs and menstrual cycle: Secondary | ICD-10-CM

## 2022-09-19 MED ORDER — ESTRADIOL 0.0375 MG/24HR TD PTWK: 0.0375 mg | MEDICATED_PATCH | TRANSDERMAL | 12 refills | Status: DC

## 2022-09-19 NOTE — Patient Instructions (Signed)
It was good to see you today.  I do not see or feel any evidence of cancer recurrence on your exam.  You will see Melissa for follow-up in 6 months and see me back in 1 year.  As always, if you develop any new and concerning symptoms before your next visit, please call to see me sooner.

## 2022-09-22 LAB — INHIBIN B: Inhibin B: <7 pg/mL

## 2022-09-24 ENCOUNTER — Ambulatory Visit: Payer: Medicare Other | Admitting: Internal Medicine

## 2022-09-24 ENCOUNTER — Encounter: Payer: Self-pay | Admitting: Internal Medicine

## 2022-09-24 VITALS — HR 63 | Ht 63.0 in | Wt 253.0 lb

## 2022-09-24 DIAGNOSIS — Z95811 Presence of heart assist device: Secondary | ICD-10-CM

## 2022-09-24 DIAGNOSIS — R195 Other fecal abnormalities: Secondary | ICD-10-CM

## 2022-09-24 DIAGNOSIS — Z7901 Long term (current) use of anticoagulants: Secondary | ICD-10-CM | POA: Diagnosis not present

## 2022-09-24 NOTE — Progress Notes (Signed)
HISTORY OF PRESENT ILLNESS:  [name] is a 52 y.o. female with medical problems listed below.  Summary from hospitalization in December for nosebleed as follows:  52 y.o. 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    Patient was admitted in 3/23 with recurrent CHF exacerbation with low cardiac output.  She was also noted to be hyperthyroidism but amiodarone had to be continued due to VT and poorly tolerated atrial fibrillation.  She required Impella 5.5 placement and eventual HM3 LVAD placement on 09/04/21. Post-op course complicated by atrial fibrillation, malnutrition, and deconditioning. She went to CIR prior to discharge.    Patient developed heavy vaginal bleeding in 7/23, she was seen in the ER and required 2 units PRBCs.  She was admitted in 8/23 and had hysterectomy + BSO, LVAD speed increased to 5900 rpm on ramp echo.  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     Patient was in her usual state of health the night prior to admission.  She had been doing, well, no significant dyspnea with her usual activities. She woke up the night prior to admission with severe epistaxis from right nostril that would not stop.  She came to the ER.  She was noted to have right posterior epistaxis that required ENT to come in and pack.  INR was 2.9, hgb 12. Keflex was started with packing and it was recommended that she come in the hospital for observation overnight.   Presents today by her primary provider Tomasita Morrow, NP regarding positive Cologuard testing.  The patient denies a family history of colon cancer.  She has had no change in bowel habits.  No rectal bleeding.  No abdominal pain.  She has had weight gain.  She remains on chronic anticoagulation.  Other medications as noted.  Blood work from September 08, 2022 shows a creatinine of 1.42.  Otherwise unremarkable comprehensive  metabolic panel.  Hemoglobin 11.6.  INR 2.2.  CT scan of the abdomen and pelvis with contrast from September 2023 revealed no significant abnormalities of the abdomen or pelvis.   REVIEW OF SYSTEMS:  All non-GI ROS negative unless otherwise stated in the HPI except for shortness of breath, anxiety  Past Medical History:  Diagnosis Date   Anemia    Automatic implantable cardioverter-defibrillator in situ    B12 deficiency    Cancer    Chronic CHF (congestive heart failure)    a. EF 15-20% b. RHC (09/2013) RA 14, RV 57/22, PA 64/36 (48), PCWP 18, FIck CO/CI 3.7/1.6, PVR 8.1 WU, PA sat 47%    Heart valve problem    History of heart attack    History of hypothyroidism    History of stomach ulcers    HLD (hyperlipidemia)    Hyperthyroidism    Hypotension    Hypothyroidism    Morbid obesity    Myocardial infarction 08/2013   Nocturnal dyspnea    Nonischemic cardiomyopathy    Palpitations    Sinus tachycardia    Sleep apnea    Snoring-prob OSA 09/04/2011   SOB (shortness of breath)    Sprint Fidelis ICD lead RECALL  6949    Stroke 2021   some residual right-sided weakness   UARS (upper airway resistance syndrome) 09/04/2011   HST 12/2013:  AHI 4/hr (numerous episodes of airflow reduction that did not have concomitant desaturation)    Vitamin  D deficiency     Past Surgical History:  Procedure Laterality Date   BREATH TEK H PYLORI N/A 11/09/2014   Procedure: BREATH TEK H PYLORI;  Surgeon: Greer Pickerel, MD;  Location: [facility];  Service: General;  Laterality: N/A;   CARDIAC CATHETERIZATION  ~ 2006; 09/2013   CARDIAC CATHETERIZATION N/A 05/23/2016   Procedure: Right Heart Cath;  Surgeon: Larey [facility]er, MD;  Location: [place] [facility];  Service: Cardiovascular;  Laterality: N/A;   CARDIAC DEFIBRILLATOR PLACEMENT  2006; 12/26/2013   Medtronic [id number]; 12-2013 ICD gen change and RV lead revision with new 6935 RV lead by Dr [name]   CENTRAL LINE INSERTION  08/28/2021    Procedure: CENTRAL LINE INSERTION;  Surgeon: Larey Dresser, MD;  Location: [place] CV LAB;  Service: Cardiovascular;;   CESAREAN SECTION  06/23/1997   IMPLANTABLE CARDIOVERTER DEFIBRILLATOR GENERATOR CHANGE N/A 12/26/2013   Procedure: IMPLANTABLE CARDIOVERTER DEFIBRILLATOR GENERATOR CHANGE;  Surgeon: Deboraha Sprang, MD;  Location: [facility] CATH LAB;  Service: Cardiovascular;  Laterality: N/A;   INSERTION OF IMPLANTABLE LEFT VENTRICULAR ASSIST DEVICE N/A 09/03/2021   Procedure: INSERTION OF IMPLANTABLE LEFT VENTRICULAR ASSIST DEVICE/ Heartmate 3;  Surgeon: Dahlia Byes, MD;  Location: [place];  Service: Open Heart Surgery;  Laterality: N/A;   IR CT HEAD LTD  04/17/2020   IR CT HEAD LTD  04/17/2020   IR INTRA CRAN STENT  04/17/2020   IR PERCUTANEOUS ART THROMBECTOMY/INFUSION INTRACRANIAL INC DIAG ANGIO  04/17/2020   IR RADIOLOGIST EVAL & MGMT  06/05/2020   LEAD REVISION N/A 12/26/2013   Procedure: LEAD REVISION;  Surgeon: Deboraha Sprang, MD;  Location: [facility] CATH LAB;  Service: Cardiovascular;  Laterality: N/A;   LEFT VENTRICULAR ASSIST DEVICE     PLACEMENT OF IMPELLA LEFT VENTRICULAR ASSIST DEVICE Right 08/29/2021   Procedure: PLACEMENT OF IMPELLA 5.5 LEFT VENTRICULAR ASSIST DEVICE;  Surgeon: Gaye Pollack, MD;  Location: [place];  Service: Open Heart Surgery;  Laterality: Right;   RADIOLOGY WITH ANESTHESIA N/A 04/17/2020   Procedure: Code Stroke;  Surgeon: Luanne Bras, MD;  Location: [place];  Service: Radiology;  Laterality: N/A;   REMOVAL OF IMPELLA LEFT VENTRICULAR ASSIST DEVICE N/A 09/03/2021   Procedure: REMOVAL OF IMPELLA LEFT VENTRICULAR ASSIST DEVICE;  Surgeon: Dahlia Byes, MD;  Location: [place];  Service: Open Heart Surgery;  Laterality: N/A;   RIGHT HEART CATH N/A 08/28/2021   Procedure: RIGHT HEART CATH;  Surgeon: Larey Dresser, MD;  Location: [place] CV LAB;  Service: Cardiovascular;  Laterality: N/A;   RIGHT HEART CATHETERIZATION N/A 02/15/2014   Procedure: RIGHT  HEART CATH;  Surgeon: Larey Dresser, MD;  Location: [facility] CATH LAB;  Service: Cardiovascular;  Laterality: N/A;   ROBOTIC ASSISTED TOTAL HYSTERECTOMY WITH BILATERAL SALPINGO OOPHERECTOMY N/A 02/05/2022   Procedure: XI ROBOTIC ASSISTED TOTAL HYSTERECTOMY WITH BILATERAL SALPINGO OOPHORECTOMY, CYSTOSCOPY, UTERUS GREATER THAN 250 GRAMS;  Surgeon: Lafonda Mosses, MD;  Location: [place];  Service: Gynecology;  Laterality: N/A;   TEE WITHOUT CARDIOVERSION N/A 08/29/2021   Procedure: TRANSESOPHAGEAL ECHOCARDIOGRAM (TEE);  Surgeon: Gaye Pollack, MD;  Location: [place];  Service: Open Heart Surgery;  Laterality: N/A;   TEE WITHOUT CARDIOVERSION N/A 09/03/2021   Procedure: TRANSESOPHAGEAL ECHOCARDIOGRAM (TEE);  Surgeon: Dahlia Byes, MD;  Location: [place];  Service: Open Heart Surgery;  Laterality: N/A;   TUBAL LIGATION  06/23/1997    Social History TASSIE GURSKI  reports that she has never smoked. She has never used smokeless tobacco.  She reports that she does not drink alcohol and does not use drugs.  family history includes Heart Problems in her mother; Heart disease in her maternal grandmother and mother; High blood pressure in her father; Hypertension in her father and mother; Obesity in her mother; Stroke in her father.  No Known Allergies     PHYSICAL EXAMINATION: Vital signs: Pulse 63   Ht 5\' 3"  (1.6 m)   Wt 253 lb (114.8 kg)   BMI 44.82 kg/m   Constitutional: Pleasant, chronically ill-appearing, obese, no acute distress Psychiatric: alert and oriented x3, cooperative Eyes: extraocular movements intact, anicteric, conjunctiva pink Mouth: oral pharynx moist, no lesions Neck: supple no lymphadenopathy Cardiovascular: Heart sounds obscured by LVAD background noise Lungs: clear to auscultation bilaterally Abdomen: soft, obese, nontender, nondistended, no obvious ascites, no peritoneal signs, normal bowel sounds, no organomegaly.  Catheter in place Rectal: Omitted Extremities: no  clubbing or cyanosis.  1+ lower extremity edema bilaterally Skin: no lesions on visible extremities Neuro: No focal deficits.  Cranial nerves intact  ASSESSMENT:  1.  Positive Cologuard testing 2.  MULTIPLE significant and serious medical problems as listed above cleaning severe cardiomyopathy with prior implantable defibrillator and LVAD placement on chronic anticoagulation therapy 3.  Morbid obesity   PLAN:  1.  The patient is at exceedingly high risk for colonoscopy given all the above issues.  As such, I recommended that we proceed with virtual colonoscopy to exclude significant intraluminal colonic pathology.  We discussed in detail the risks, benefits, and alternatives to this strategy.  She is in agreement.  Should she have a significant abnormality of the colon requiring colonoscopy, we would need to confer with her cardiology team and discuss/make arrangements for further evaluation. A total time of 60 minutes was spent preparing to see the patient, reviewing the myriad of records, laboratories, and x-rays.  Obtaining comprehensive history, performing medically appropriate physical examination, counseling educating patient regarding the above listed issues, ordering advanced radiology study, and documenting clinical information in the health record.

## 2022-09-24 NOTE — Patient Instructions (Signed)
You have been scheduled for a CT virtual colonoscopy at [facility] ( [place] location). Your appointment is scheduled for may 8th at 8:00am . Please go to [facility] at least a week prior to your exam to get your prep and instructions. If you need to reschedule your appointment or have specific questions regarding this test, please contact [facility] at [phone].   _______________________________________________________  If your blood pressure at your visit was 140/90 or greater, please contact your primary care physician to follow up on this.  _______________________________________________________  If you are age 52 or older, your body mass index should be between 23-30. Your Body mass index is 44.82 kg/m. If this is out of the aforementioned range listed, please consider follow up with your Primary Care Provider.  If you are age 13 or younger, your body mass index should be between 19-25. Your Body mass index is 44.82 kg/m. If this is out of the aformentioned range listed, please consider follow up with your Primary Care Provider.   ________________________________________________________  The [facility] providers would like to encourage you to use [facility] to communicate with providers for non-urgent requests or questions.  Due to long hold times on the telephone, sending your provider a message by [facility] may be a faster and more efficient way to get a response.  Please allow 48 business hours for a response.  Please remember that this is for non-urgent requests.  _______________________________________________________ It was a pleasure to see you today!  Thank you for trusting me with your gastrointestinal care!

## 2022-09-25 ENCOUNTER — Ambulatory Visit (HOSPITAL_COMMUNITY): Payer: Self-pay

## 2022-09-25 ENCOUNTER — Ambulatory Visit (INDEPENDENT_AMBULATORY_CARE_PROVIDER_SITE_OTHER): Payer: Medicare Other | Admitting: Family Medicine

## 2022-09-25 LAB — POCT INR: INR: 1.8 — AB (ref 2.0–3.0)

## 2022-09-30 ENCOUNTER — Encounter (INDEPENDENT_AMBULATORY_CARE_PROVIDER_SITE_OTHER): Payer: Self-pay | Admitting: Family Medicine

## 2022-09-30 ENCOUNTER — Ambulatory Visit (INDEPENDENT_AMBULATORY_CARE_PROVIDER_SITE_OTHER): Payer: Medicare Other | Admitting: Family Medicine

## 2022-09-30 ENCOUNTER — Other Ambulatory Visit (HOSPITAL_COMMUNITY): Payer: Self-pay

## 2022-09-30 VITALS — HR 75 | Temp 98.3°F | Ht 63.0 in | Wt 251.0 lb

## 2022-09-30 DIAGNOSIS — Z6841 Body Mass Index (BMI) 40.0 and over, adult: Secondary | ICD-10-CM | POA: Diagnosis not present

## 2022-09-30 DIAGNOSIS — E88819 Insulin resistance, unspecified: Secondary | ICD-10-CM | POA: Diagnosis not present

## 2022-09-30 DIAGNOSIS — I5022 Chronic systolic (congestive) heart failure: Secondary | ICD-10-CM

## 2022-09-30 MED ORDER — RYBELSUS 3 MG PO TABS: 3.0000 mg | ORAL_TABLET | Freq: Every day | ORAL | 0 refills | Status: DC

## 2022-09-30 NOTE — Assessment & Plan Note (Signed)
Stable, managed by cardiology.  Her diuretic was recently increased.  Her blood pressure is stable.  She has no signs of fluid overload today.  Her water weight is up 5.8 pounds from last visit.  Continue LVAD therapy, exercise as tolerated.  Continue current medications per cardiology.  She is working on BMI reduction to 30 or less for cardiac transplant.  Will continue active plan for weight reduction.

## 2022-09-30 NOTE — Assessment & Plan Note (Signed)
Fasting insulin level elevated at October 2023 at 17.8 with a Homa IR score of 3.3 showing insulin resistance.  She is not a candidate for metformin due to her chronic systolic heart failure.  She has actually been working on dietary change, reducing intake of added sugar and refined carbohydrates.  Her exercise has been consistent at least 3 days a week.  She is actively working on weight loss.  Ozempic has been denied even with an appeal to her insurance company.  She is a good Candidate for use of Rybelsus 3 mg tab 30 minutes before breakfast daily we have discussed potential adverse side effects.  Will see her back for follow-up in 1 month.

## 2022-09-30 NOTE — Progress Notes (Signed)
Office: [phone]  /  Fax: [phone]  WEIGHT SUMMARY AND BIOMETRICS  Starting Date: 04/09/22  Starting Weight: 243lb   Weight Lost Since Last Visit: 0   Vitals Temp: 98.3 F (36.8 C) BP: -- (unable to get patient BP) Pulse Rate: 75 SpO2: 99 %   Body Composition  Body Fat %: 48.8 % Fat Mass (lbs): 122.6 lbs Muscle Mass (lbs): 122.4 lbs Total Body Water (lbs): 90 lbs Visceral Fat Rating : 16    HPI  Chief Complaint: OBESITY  [name] is here to discuss her progress with her obesity treatment plan. She is on the the Category 2 Plan and states she is following her eating plan approximately 80 % of the time. She states she is exercising 60 minutes 3 times per week.   Interval History:  Since last office visit she is down 0 lb She denies much of an appetite She is eating on a schedule and is focused on getting more lean protein and fiber Denies excess snacking or cravings She was on a round of prednisone and her thyroid has been running hypo due to use of amiodorone She is [facility] She is trying to lose weight for cardiac transplant- she needs a BMI <35 She was not able to get Ozempic even after the appeal  Pharmacotherapy: none  PHYSICAL EXAM:  Pulse 75, temperature 98.3 F (36.8 C), height 5\' 3"  (1.6 m), weight 251 lb (113.9 kg), SpO2 99 %. Body mass index is 44.46 kg/m.  General: She is overweight, cooperative, alert, well developed, and in no acute distress. PSYCH: Has normal mood, affect and thought process.   Lungs: Normal breathing effort, no conversational dyspnea.   ASSESSMENT AND PLAN  TREATMENT PLAN FOR OBESITY:  Recommended Dietary Goals  Mylani is currently in the action stage of change. As such, her goal is to continue weight management plan. She has agreed to the Category 2 Plan.  Behavioral Intervention  We discussed the following Behavioral Modification Strategies today: increasing lean protein intake,  increasing fiber rich foods, reading food labels , decreasing eating out or consumption of processed foods, and making healthy choices when eating convenient foods, and planning for success.  Additional resources provided today: NA  Recommended Physical Activity Goals  Lucylle has been advised to work up to 150 minutes of moderate intensity aerobic activity a week and strengthening exercises 2-3 times per week for cardiovascular health, weight loss maintenance and preservation of muscle mass.   She has agreed to Work on scheduling and tracking physical activity.   Pharmacotherapy changes for the treatment of obesity: None  ASSOCIATED CONDITIONS ADDRESSED TODAY  Insulin resistance Assessment & Plan: Fasting insulin level elevated at October 2023 at 17.8 with a Homa IR score of 3.3 showing insulin resistance.  She is not a candidate for metformin due to her chronic systolic heart failure.  She has actually been working on dietary change, reducing intake of added sugar and refined carbohydrates.  Her exercise has been consistent at least 3 days a week.  She is actively working on weight loss.  Ozempic has been denied even with an appeal to her insurance company.  She is a good Candidate for use of Rybelsus 3 mg tab 30 minutes before breakfast daily we have discussed potential adverse side effects.  Will see her back for follow-up in 1 month.  Orders: -     Rybelsus; Take 1 tablet (3 mg total) by mouth daily.  Dispense: 30 tablet; Refill: 0  Morbid obesity  BMI 40.0-44.9, adult  Chronic systolic heart failure Assessment & Plan: Stable, managed by cardiology.  Her diuretic was recently increased.  Her blood pressure is stable.  She has no signs of fluid overload today.  Her water weight is up 5.8 pounds from last visit.  Continue LVAD therapy, exercise as tolerated.  Continue current medications per cardiology.  She is working on BMI reduction to 30 or less for cardiac transplant.  Will  continue active plan for weight reduction.       She was informed of the importance of frequent follow up visits to maximize her success with intensive lifestyle modifications for her multiple health conditions.   ATTESTASTION STATEMENTS:  Reviewed by clinician on day of visit: allergies, medications, problem list, medical history, surgical history, family history, social history, and previous encounter notes pertinent to obesity diagnosis.   I have personally spent 30 minutes total time today in preparation, patient care, nutritional counseling and documentation for this visit, including the following: review of clinical lab tests; review of medical tests/procedures/services.      Glennis Brink, DO DABFM, DABOM [facility] [address] [place], [place] [address] [phone]

## 2022-10-01 ENCOUNTER — Telehealth (INDEPENDENT_AMBULATORY_CARE_PROVIDER_SITE_OTHER): Payer: Self-pay | Admitting: Family Medicine

## 2022-10-01 NOTE — Telephone Encounter (Signed)
Patients Rybelsus was denied patient and provider notified.

## 2022-10-01 NOTE — Telephone Encounter (Signed)
Prior authorization done via cover my meds for patients Rybelsus. Waiting on determination.  

## 2022-10-02 ENCOUNTER — Ambulatory Visit (HOSPITAL_COMMUNITY): Payer: Self-pay

## 2022-10-02 DIAGNOSIS — Z7901 Long term (current) use of anticoagulants: Secondary | ICD-10-CM | POA: Diagnosis not present

## 2022-10-02 LAB — POCT INR: INR: 2.5 (ref 2.0–3.0)

## 2022-10-04 ENCOUNTER — Other Ambulatory Visit (HOSPITAL_COMMUNITY): Payer: Self-pay

## 2022-10-09 ENCOUNTER — Ambulatory Visit (HOSPITAL_COMMUNITY): Payer: Self-pay

## 2022-10-09 LAB — POCT INR: INR: 2 (ref 2.0–3.0)

## 2022-10-14 ENCOUNTER — Telehealth: Payer: Self-pay | Admitting: Nurse Practitioner

## 2022-10-14 NOTE — Telephone Encounter (Signed)
Contacted Sandrea Matte to schedule their annual wellness visit. Appointment made for 10/20/2022.  Thank you,  [facility] Support [facility] Direct dial  [phone]

## 2022-10-16 ENCOUNTER — Ambulatory Visit (HOSPITAL_COMMUNITY): Payer: Self-pay | Admitting: Pharmacist

## 2022-10-16 LAB — POCT INR: INR: 1.8 — AB (ref 2.0–3.0)

## 2022-10-20 ENCOUNTER — Ambulatory Visit (INDEPENDENT_AMBULATORY_CARE_PROVIDER_SITE_OTHER): Payer: Medicare Other

## 2022-10-20 VITALS — Ht 63.0 in | Wt 251.0 lb

## 2022-10-20 DIAGNOSIS — Z0000 Encounter for general adult medical examination without abnormal findings: Secondary | ICD-10-CM | POA: Diagnosis not present

## 2022-10-20 NOTE — Patient Instructions (Addendum)
[name] , Thank you for taking time to come for your Medicare Wellness Visit. I appreciate your ongoing commitment to your health goals. Please review the following plan we discussed and let me know if I can assist you in the future.   These are the goals we discussed:  Goals       Patient Stated     Weight (lb) < 200 lb (90.7 kg) (pt-stated)      I would like to lose weight Weight goal 190lb BMI <35        This is a list of the screening recommended for you and due dates:  Health Maintenance  Topic Date Due   DTaP/Tdap/Td vaccine (2 - Td or Tdap) 01/26/2017   Zoster (Shingles) Vaccine (1 of 2) 01/19/2023*   Colon Cancer Screening  07/05/2023*   Flu Shot  01/22/2023   Medicare Annual Wellness Visit  10/20/2023   Mammogram  02/22/2024   Pap Smear  10/11/2024   Hepatitis C Screening: USPSTF Recommendation to screen - Ages 18-79 yo.  Completed   HIV Screening  Completed   HPV Vaccine  Aged Out   COVID-19 Vaccine  Discontinued  *Topic was postponed. The date shown is not the original due date.    Conditions/risks identified: none new  Next appointment: Follow up in one year for your annual wellness visit.   Preventive Care 40-64 Years, Female Preventive care refers to lifestyle choices and visits with your health care provider that can promote health and wellness. What does preventive care include? A yearly physical exam. This is also called an annual well check. Dental exams once or twice a year. Routine eye exams. Ask your health care provider how often you should have your eyes checked. Personal lifestyle choices, including: Daily care of your teeth and gums. Regular physical activity. Eating a healthy diet. Avoiding tobacco and drug use. Limiting alcohol use. Practicing safe sex. Taking low-dose aspirin daily starting at age 81. Taking vitamin and mineral supplements as recommended by your health care provider. What happens during an annual well check? The  services and screenings done by your health care provider during your annual well check will depend on your age, overall health, lifestyle risk factors, and family history of disease. Counseling  Your health care provider may ask you questions about your: Alcohol use. Tobacco use. Drug use. Emotional well-being. Home and relationship well-being. Sexual activity. Eating habits. Work and work Astronomer. Method of birth control. Menstrual cycle. Pregnancy history. Screening  You may have the following tests or measurements: Height, weight, and BMI. Blood pressure. Lipid and cholesterol levels. These may be checked every 5 years, or more frequently if you are over 62 years old. Skin check. Lung cancer screening. You may have this screening every year starting at age 106 if you have a 30-pack-year history of smoking and currently smoke or have quit within the past 15 years. Fecal occult blood test (FOBT) of the stool. You may have this test every year starting at age 33. Flexible sigmoidoscopy or colonoscopy. You may have a sigmoidoscopy every 5 years or a colonoscopy every 10 years starting at age 27. Hepatitis C blood test. Hepatitis B blood test. Sexually transmitted disease (STD) testing. Diabetes screening. This is done by checking your blood sugar (glucose) after you have not eaten for a while (fasting). You may have this done every 1-3 years. Mammogram. This may be done every 1-2 years. Talk to your health care provider about when you should start  having regular mammograms. This may depend on whether you have a family history of breast cancer. BRCA-related cancer screening. This may be done if you have a family history of breast, ovarian, tubal, or peritoneal cancers. Pelvic exam and Pap test. This may be done every 3 years starting at age 29. Starting at age 39, this may be done every 5 years if you have a Pap test in combination with an HPV test. Bone density scan. This is done to  screen for osteoporosis. You may have this scan if you are at high risk for osteoporosis. Discuss your test results, treatment options, and if necessary, the need for more tests with your health care provider. Vaccines  Your health care provider may recommend certain vaccines, such as: Influenza vaccine. This is recommended every year. Tetanus, diphtheria, and acellular pertussis (Tdap, Td) vaccine. You may need a Td booster every 10 years. Zoster vaccine. You may need this after age 58. Pneumococcal 13-valent conjugate (PCV13) vaccine. You may need this if you have certain conditions and were not previously vaccinated. Pneumococcal polysaccharide (PPSV23) vaccine. You may need one or two doses if you smoke cigarettes or if you have certain conditions. Talk to your health care provider about which screenings and vaccines you need and how often you need them. This information is not intended to replace advice given to you by your health care provider. Make sure you discuss any questions you have with your health care provider. Document Released: 07/06/2015 Document Revised: 02/27/2016 Document Reviewed: 04/10/2015 Elsevier Interactive Patient Education  2017 [employer].    Fall Prevention in the Home Falls can cause injuries. They can happen to people of all ages. There are many things you can do to make your home safe and to help prevent falls. What can I do on the outside of my home? Regularly fix the edges of walkways and driveways and fix any cracks. Remove anything that might make you trip as you walk through a door, such as a raised step or threshold. Trim any bushes or trees on the path to your home. Use bright outdoor lighting. Clear any walking paths of anything that might make someone trip, such as rocks or tools. Regularly check to see if handrails are loose or broken. Make sure that both sides of any steps have handrails. Any raised decks and porches should have guardrails on  the edges. Have any leaves, snow, or ice cleared regularly. Use sand or salt on walking paths during winter. Clean up any spills in your garage right away. This includes oil or grease spills. What can I do in the bathroom? Use night lights. Install grab bars by the toilet and in the tub and shower. Do not use towel bars as grab bars. Use non-skid mats or decals in the tub or shower. If you need to sit down in the shower, use a plastic, non-slip stool. Keep the floor dry. Clean up any water that spills on the floor as soon as it happens. Remove soap buildup in the tub or shower regularly. Attach bath mats securely with double-sided non-slip rug tape. Do not have throw rugs and other things on the floor that can make you trip. What can I do in the bedroom? Use night lights. Make sure that you have a light by your bed that is easy to reach. Do not use any sheets or blankets that are too big for your bed. They should not hang down onto the floor. Have a firm chair that  has side arms. You can use this for support while you get dressed. Do not have throw rugs and other things on the floor that can make you trip. What can I do in the kitchen? Clean up any spills right away. Avoid walking on wet floors. Keep items that you use a lot in easy-to-reach places. If you need to reach something above you, use a strong step stool that has a grab bar. Keep electrical cords out of the way. Do not use floor polish or wax that makes floors slippery. If you must use wax, use non-skid floor wax. Do not have throw rugs and other things on the floor that can make you trip. What can I do with my stairs? Do not leave any items on the stairs. Make sure that there are handrails on both sides of the stairs and use them. Fix handrails that are broken or loose. Make sure that handrails are as long as the stairways. Check any carpeting to make sure that it is firmly attached to the stairs. Fix any carpet that is loose  or worn. Avoid having throw rugs at the top or bottom of the stairs. If you do have throw rugs, attach them to the floor with carpet tape. Make sure that you have a light switch at the top of the stairs and the bottom of the stairs. If you do not have them, ask someone to add them for you. What else can I do to help prevent falls? Wear shoes that: Do not have high heels. Have rubber bottoms. Are comfortable and fit you well. Are closed at the toe. Do not wear sandals. If you use a stepladder: Make sure that it is fully opened. Do not climb a closed stepladder. Make sure that both sides of the stepladder are locked into place. Ask someone to hold it for you, if possible. Clearly mark and make sure that you can see: Any grab bars or handrails. First and last steps. Where the edge of each step is. Use tools that help you move around (mobility aids) if they are needed. These include: Canes. Walkers. Scooters. Crutches. Turn on the lights when you go into a dark area. Replace any light bulbs as soon as they burn out. Set up your furniture so you have a clear path. Avoid moving your furniture around. If any of your floors are uneven, fix them. If there are any pets around you, be aware of where they are. Review your medicines with your doctor. Some medicines can make you feel dizzy. This can increase your chance of falling. Ask your doctor what other things that you can do to help prevent falls. This information is not intended to replace advice given to you by your health care provider. Make sure you discuss any questions you have with your health care provider. Document Released: 04/05/2009 Document Revised: 11/15/2015 Document Reviewed: 07/14/2014 Elsevier Interactive Patient Education  2017 [employer].

## 2022-10-20 NOTE — Progress Notes (Signed)
Subjective:   SHRAVYA WICKWIRE is a 52 y.o. female who presents for an Initial Medicare Annual Wellness Visit.  Review of Systems    No ROS.  Medicare Wellness Virtual Visit.  Visual/audio telehealth visit, UTA vital signs.   See social history for additional risk factors.   Cardiac Risk Factors include: advanced age (>87men, >69 women)     Objective:    Today's Vitals   10/20/22 1336  Weight: 251 lb (113.9 kg)  Height: 5\' 3"  (1.6 m)   Body mass index is 44.46 kg/m.     10/20/2022    1:36 PM 06/22/2022    4:09 AM 02/17/2022    9:03 PM 02/05/2022   10:58 AM 02/03/2022   10:52 AM 12/30/2021    8:36 PM 12/24/2021    6:07 PM  Advanced Directives  Does Patient Have a Medical Advance Directive? No No No No No No No  Would patient like information on creating a medical advance directive? No - Patient declined  No - Patient declined No - Patient declined No - Patient declined No - Patient declined     Current Medications (verified) Outpatient Encounter Medications as of 10/20/2022  Medication Sig   ALPRAZolam (XANAX) 0.25 MG tablet Take 1 tablet (0.25 mg total) by mouth 2 (two) times daily as needed for anxiety.   amiodarone (PACERONE) 200 MG tablet Take 1 pill twice a day for 10 days and then resume a dose of 1 pill daily   aspirin EC 81 MG tablet Take 1 tablet (81 mg total) by mouth daily. Swallow whole.   dapagliflozin propanediol (FARXIGA) 10 MG TABS tablet Take 1 tablet (10 mg total) by mouth daily before breakfast.   estradiol (CLIMARA) 0.0375 mg/24hr patch Place 1 patch (0.0375 mg total) onto the skin once a week.   methimazole (TAPAZOLE) 5 MG tablet Take 1 tablet (5 mg total) by mouth as directed. Methimazole 5 mg, four days a week   potassium chloride SA (KLOR-CON M) 20 MEQ tablet Take 3 tablets (60 mEq total) by mouth daily.   quiniDINE gluconate 324 MG CR tablet Take 1 tablet (324 mg total) by mouth 2 (two) times daily.   rosuvastatin (CRESTOR) 40 MG tablet Take 1 tablet (40  mg total) by mouth daily.   Semaglutide (RYBELSUS) 3 MG TABS Take 1 tablet (3 mg total) by mouth daily.   sildenafil (REVATIO) 20 MG tablet Take 1 tablet (20 mg total) by mouth 3 (three) times daily.   spironolactone (ALDACTONE) 25 MG tablet Take 1 tablet (25 mg total) by mouth daily. Restart on 02/10/22.   torsemide (DEMADEX) 20 MG tablet Take 1 tablet (20 mg total) by mouth every other day. Take 1 tablet (20 mg) by mouth as needed, or as directed by heart failure clinic.   warfarin (COUMADIN) 2 MG tablet Take 2 tablets (4 mg total) by mouth daily at 4 PM. Take 4mg  daily  or as directed by LVAD Clinic.   No facility-administered encounter medications on file as of 10/20/2022.    Allergies (verified) Patient has no known allergies.   History: Past Medical History:  Diagnosis Date   Anemia    Automatic implantable cardioverter-defibrillator in situ    B12 deficiency    Cancer (HCC)    Chronic CHF (congestive heart failure) (HCC)    a. EF 15-20% b. RHC (09/2013) RA 14, RV 57/22, PA 64/36 (48), PCWP 18, FIck CO/CI 3.7/1.6, PVR 8.1 WU, PA sat 47%    Heart valve  problem    History of heart attack    History of hypothyroidism    History of stomach ulcers    HLD (hyperlipidemia)    Hyperthyroidism    Hypotension    Hypothyroidism    Morbid obesity (HCC)    Myocardial infarction (HCC) 08/2013   Nocturnal dyspnea    Nonischemic cardiomyopathy (HCC)    Palpitations    Sinus tachycardia    Sleep apnea    Snoring-prob OSA 09/04/2011   SOB (shortness of breath)    Sprint Fidelis ICD lead RECALL  6949    Stroke (HCC) 2021   some residual right-sided weakness   UARS (upper airway resistance syndrome) 09/04/2011   HST 12/2013:  AHI 4/hr (numerous episodes of airflow reduction that did not have concomitant desaturation)    Vitamin D deficiency    Past Surgical History:  Procedure Laterality Date   BREATH TEK H PYLORI N/A 11/09/2014   Procedure: BREATH TEK H PYLORI;  Surgeon: Gaynelle Adu,  MD;  Location: [facility];  Service: General;  Laterality: N/A;   CARDIAC CATHETERIZATION  ~ 2006; 09/2013   CARDIAC CATHETERIZATION N/A 05/23/2016   Procedure: Right Heart Cath;  Surgeon: Laurey Morale, MD;  Location: [facility] INVASIVE CV LAB;  Service: Cardiovascular;  Laterality: N/A;   CARDIAC DEFIBRILLATOR PLACEMENT  2006; 12/26/2013   Medtronic [id number]; 12-2013 ICD gen change and RV lead revision with new 6935 RV lead by Dr Graciela Husbands   CENTRAL LINE INSERTION  08/28/2021   Procedure: CENTRAL LINE INSERTION;  Surgeon: Laurey Morale, MD;  Location: [facility] INVASIVE CV LAB;  Service: Cardiovascular;;   CESAREAN SECTION  06/23/1997   IMPLANTABLE CARDIOVERTER DEFIBRILLATOR GENERATOR CHANGE N/A 12/26/2013   Procedure: IMPLANTABLE CARDIOVERTER DEFIBRILLATOR GENERATOR CHANGE;  Surgeon: Duke Salvia, MD;  Location: [facility] CATH LAB;  Service: Cardiovascular;  Laterality: N/A;   INSERTION OF IMPLANTABLE LEFT VENTRICULAR ASSIST DEVICE N/A 09/03/2021   Procedure: INSERTION OF IMPLANTABLE LEFT VENTRICULAR ASSIST DEVICE/ Heartmate 3;  Surgeon: Lovett Sox, MD;  Location: [facility] OR;  Service: Open Heart Surgery;  Laterality: N/A;   IR CT HEAD LTD  04/17/2020   IR CT HEAD LTD  04/17/2020   IR INTRA CRAN STENT  04/17/2020   IR PERCUTANEOUS ART THROMBECTOMY/INFUSION INTRACRANIAL INC DIAG ANGIO  04/17/2020   IR RADIOLOGIST EVAL & MGMT  06/05/2020   LEAD REVISION N/A 12/26/2013   Procedure: LEAD REVISION;  Surgeon: Duke Salvia, MD;  Location: [facility] CATH LAB;  Service: Cardiovascular;  Laterality: N/A;   LEFT VENTRICULAR ASSIST DEVICE     PLACEMENT OF IMPELLA LEFT VENTRICULAR ASSIST DEVICE Right 08/29/2021   Procedure: PLACEMENT OF IMPELLA 5.5 LEFT VENTRICULAR ASSIST DEVICE;  Surgeon: Alleen Borne, MD;  Location: [facility] OR;  Service: Open Heart Surgery;  Laterality: Right;   RADIOLOGY WITH ANESTHESIA N/A 04/17/2020   Procedure: Code Stroke;  Surgeon: Julieanne Cotton, MD;  Location: [facility] OR;  Service: Radiology;   Laterality: N/A;   REMOVAL OF IMPELLA LEFT VENTRICULAR ASSIST DEVICE N/A 09/03/2021   Procedure: REMOVAL OF IMPELLA LEFT VENTRICULAR ASSIST DEVICE;  Surgeon: Lovett Sox, MD;  Location: [facility] OR;  Service: Open Heart Surgery;  Laterality: N/A;   RIGHT HEART CATH N/A 08/28/2021   Procedure: RIGHT HEART CATH;  Surgeon: Laurey Morale, MD;  Location: [facility] INVASIVE CV LAB;  Service: Cardiovascular;  Laterality: N/A;   RIGHT HEART CATHETERIZATION N/A 02/15/2014   Procedure: RIGHT HEART CATH;  Surgeon: Laurey Morale, MD;  Location: [facility] CATH LAB;  Service:  Cardiovascular;  Laterality: N/A;   ROBOTIC ASSISTED TOTAL HYSTERECTOMY WITH BILATERAL SALPINGO OOPHERECTOMY N/A 02/05/2022   Procedure: XI ROBOTIC ASSISTED TOTAL HYSTERECTOMY WITH BILATERAL SALPINGO OOPHORECTOMY, CYSTOSCOPY, UTERUS GREATER THAN 250 GRAMS;  Surgeon: Carver Fila, MD;  Location: [facility] OR;  Service: Gynecology;  Laterality: N/A;   TEE WITHOUT CARDIOVERSION N/A 08/29/2021   Procedure: TRANSESOPHAGEAL ECHOCARDIOGRAM (TEE);  Surgeon: Alleen Borne, MD;  Location: [facility] OR;  Service: Open Heart Surgery;  Laterality: N/A;   TEE WITHOUT CARDIOVERSION N/A 09/03/2021   Procedure: TRANSESOPHAGEAL ECHOCARDIOGRAM (TEE);  Surgeon: Lovett Sox, MD;  Location: [facility] OR;  Service: Open Heart Surgery;  Laterality: N/A;   TUBAL LIGATION  06/23/1997   Family History  Problem Relation Age of Onset   Hypertension Mother    Heart disease Mother    Obesity Mother    Heart Problems Mother    Hypertension Father    High blood pressure Father    Stroke Father    Heart disease Maternal Grandmother    Colon cancer Neg Hx    Breast cancer Neg Hx    Ovarian cancer Neg Hx    Endometrial cancer Neg Hx    Pancreatic cancer Neg Hx    Prostate cancer Neg Hx    Liver disease Neg Hx    Cancer - Colon Neg Hx    Social History   Socioeconomic History   Marital status: Married    Spouse name: Salena Ortlieb   Number of children: 2   Years of education:  Not on file   Highest education level: Not on file  Occupational History   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  Tobacco Use   Smoking status: Never   Smokeless tobacco: Never  Vaping Use   Vaping Use: Never used  Substance and Sexual Activity   Alcohol use: No   Drug use: No   Sexual activity: Not Currently  Other Topics Concern   Not on file  Social History Narrative   Neita Carp sometimes   Right handed   Social Determinants of Health   Financial Resource Strain: Low Risk  (10/20/2022)   Overall Financial Resource Strain (CARDIA)    Difficulty of Paying Living Expenses: Not hard at all  Food Insecurity: No Food Insecurity (10/20/2022)   Hunger Vital Sign    Worried About Running Out of Food in the Last Year: Never true    Ran Out of Food in the Last Year: Never true  Transportation Needs: No Transportation Needs (10/20/2022)   PRAPARE - Administrator, Civil Service (Medical): No    Lack of Transportation (Non-Medical): No  Physical Activity: Sufficiently Active (10/20/2022)   Exercise Vital Sign    Days of Exercise per Week: 3 days    Minutes of Exercise per Session: 60 min  Stress: No Stress Concern Present (10/20/2022)   [employer] of Occupational Health - Occupational Stress Questionnaire    Feeling of Stress : Only a little  Social Connections: Socially Integrated (10/20/2022)   Social Connection and Isolation Panel [NHANES]    Frequency of Communication with Friends and Family: More than three times a week    Frequency of Social Gatherings with Friends and Family: More than three times a week    Attends Religious Services: More than 4 times per year    Active Member of [employer] or Organizations: Yes    Attends Engineer, structural: More than 4 times per year    Marital Status: Married  Tobacco Counseling Counseling given: Not Answered   Clinical Intake:  Pre-visit preparation completed: Yes        Diabetes: No  How often  do you need to have someone help you when you read instructions, pamphlets, or other written materials from your doctor or pharmacy?: 1 - Never   Interpreter Needed?: No      Activities of Daily Living    10/20/2022    1:38 PM 06/22/2022    2:00 PM  In your present state of health, do you have any difficulty performing the following activities:  Hearing? 0 0  Vision? 0 0  Difficulty concentrating or making decisions? 0 0  Walking or climbing stairs? 1 0  Dressing or bathing? 0 0  Doing errands, shopping? 1 0  Preparing Food and eating ? N   Using the Toilet? N   In the past six months, have you accidently leaked urine? N   Do you have problems with loss of bowel control? N   Managing your Medications? N   Managing your Finances? N   Housekeeping or managing your Housekeeping? N     Patient Care Team: Bethanie Dicker, NP as PCP - General (Nurse Practitioner) Laurey Morale, MD as PCP - Cardiology (Cardiology) Duke Salvia, MD as PCP - Electrophysiology (Cardiology) Quintella Reichert, MD as PCP - Sleep Medicine (Cardiology)  Indicate any recent Medical Services you may have received from other than [facility] providers in the past year (date may be approximate).     Assessment:   This is a routine wellness examination for [place].  I connected with  Sandrea Matte on 10/20/22 by a audio enabled telemedicine application and verified that I am speaking with the correct person using two identifiers.  Patient Location: Home  Provider Location: Office/Clinic  I discussed the limitations of evaluation and management by telemedicine. The patient expressed understanding and agreed to proceed.   Hearing/Vision screen Hearing Screening - Comments:: Patient is able to hear conversational tones without difficulty.  No issues reported.   Vision Screening - Comments:: Wears corrective reader lenses They have seen their ophthalmologist in the last 12 months.    Dietary issues and  exercise activities discussed: Current Exercise Habits: Home exercise routine, Type of exercise: strength training/weights;calisthenics, Time (Minutes): 60, Frequency (Times/Week): 3, Weekly Exercise (Minutes/Week): 180, Intensity: Mild   Goals Addressed               This Visit's Progress     Patient Stated     Weight (lb) < 200 lb (90.7 kg) (pt-stated)   251 lb (113.9 kg)     I would like to lose weight Weight goal 190lb BMI <35       Depression Screen    10/20/2022    1:46 PM 07/04/2022    8:59 AM 04/09/2022   10:04 AM 02/28/2022    9:38 AM 11/13/2021   12:24 PM 09/03/2020    7:39 AM 08/06/2020    2:24 PM  PHQ 2/9 Scores  PHQ - 2 Score 0 0 1 0 0 0 0  PHQ- 9 Score  2 4 2 2 2 5     Fall Risk    10/20/2022    1:38 PM 07/04/2022    8:59 AM 11/01/2021    2:06 PM 07/26/2020    8:57 AM 11/27/2014    2:46 PM  Fall Risk   Falls in the past year? 0 0 0 1 No  Number falls in past yr:  0 0  0   Injury with Fall? 0 0  0   Risk for fall due to :  No Fall Risks Medication side effect No Fall Risks   Follow up Falls evaluation completed;Falls prevention discussed Falls evaluation completed Education provided;Falls prevention discussed Falls evaluation completed;Education provided;Falls prevention discussed     FALL RISK PREVENTION PERTAINING TO THE HOME: Home free of loose throw rugs in walkways, pet beds, electrical cords, etc? Yes  Adequate lighting in your home to reduce risk of falls? Yes   ASSISTIVE DEVICES UTILIZED TO PREVENT FALLS:  Medical/Life alert? Yes  Use of a cane, walker or w/c? No  Grab bars in the bathroom? Yes  Shower chair or bench in shower? Yes  Comfort chair height, Elevated toilet seat or a handicapped toilet? Yes   TIMED UP AND GO: Was the test performed? No .   Cognitive Function:        10/20/2022    1:50 PM  6CIT Screen  What Year? 0 points  What month? 0 points  What time? 0 points  Count back from 20 0 points  Months in reverse 0 points   Repeat phrase 0 points  Total Score 0 points    Immunizations Immunization History  Administered Date(s) Administered   Influenza Split 03/23/2013, 04/24/2015   Influenza, Quadrivalent, Recombinant, Inj, Pf 04/28/2019   Influenza-Unspecified 02/25/2019, 04/17/2022   Tdap 01/27/2007   TDAP status: Due, Education has been provided regarding the importance of this vaccine. Advised may receive this vaccine at local pharmacy or Health Dept. Aware to provide a copy of the vaccination record if obtained from local pharmacy or Health Dept. Verbalized acceptance and understanding.  Covid-19 vaccine status: Declined, Education has been provided regarding the importance of this vaccine but patient still declined. Advised may receive this vaccine at local pharmacy or Health Dept.or vaccine clinic. Aware to provide a copy of the vaccination record if obtained from local pharmacy or Health Dept. Verbalized acceptance and understanding.  Shingrix Completed?: No.    Education has been provided regarding the importance of this vaccine. Patient has been advised to call insurance company to determine out of pocket expense if they have not yet received this vaccine. Advised may also receive vaccine at local pharmacy or Health Dept. Verbalized acceptance and understanding.  Screening Tests Health Maintenance  Topic Date Due   DTaP/Tdap/Td (2 - Td or Tdap) 01/26/2017   Zoster Vaccines- Shingrix (1 of 2) 01/19/2023 (Originally 02/14/1990)   COLONOSCOPY (Pts 45-105yrs Insurance coverage will need to be confirmed)  07/05/2023 (Originally 02/15/2016)   INFLUENZA VACCINE  01/22/2023   Medicare Annual Wellness (AWV)  10/20/2023   MAMMOGRAM  02/22/2024   PAP SMEAR-Modifier  10/11/2024   Hepatitis C Screening  Completed   HIV Screening  Completed   HPV VACCINES  Aged Out   COVID-19 Vaccine  Discontinued    Health Maintenance  Health Maintenance Due  Topic Date Due   DTaP/Tdap/Td (2 - Td or Tdap) 01/26/2017     Lung Cancer Screening: (Low Dose CT Chest recommended if Age 31-80 years, 30 pack-year currently smoking OR have quit w/in 15years.) does not qualify.   Hepatitis C Screening: Completed 08/2021.  Vision Screening: Recommended annual ophthalmology exams for early detection of glaucoma and other disorders of the eye.  Dental Screening: Recommended annual dental exams for proper oral hygiene  Community Resource Referral / Chronic Care Management: CRR required this visit?  No   CCM required this visit?  No  Plan:     I have personally reviewed and noted the following in the patient's chart:   Medical and social history Use of alcohol, tobacco or illicit drugs  Current medications and supplements including opioid prescriptions. Patient is not currently taking opioid prescriptions. Functional ability and status Nutritional status Physical activity Advanced directives List of other physicians Hospitalizations, surgeries, and ER visits in previous 12 months Vitals Screenings to include cognitive, depression, and falls Referrals and appointments  In addition, I have reviewed and discussed with patient certain preventive protocols, quality metrics, and best practice recommendations. A written personalized care plan for preventive services as well as general preventive health recommendations were provided to patient.     Cathey Endow, LPN   2/84/1324

## 2022-10-23 ENCOUNTER — Ambulatory Visit (HOSPITAL_COMMUNITY): Payer: Self-pay | Admitting: Pharmacist

## 2022-10-23 LAB — POCT INR: INR: 2.4 (ref 2.0–3.0)

## 2022-10-24 ENCOUNTER — Other Ambulatory Visit: Payer: Self-pay

## 2022-10-24 ENCOUNTER — Other Ambulatory Visit (HOSPITAL_COMMUNITY): Payer: Self-pay

## 2022-10-27 ENCOUNTER — Other Ambulatory Visit (HOSPITAL_COMMUNITY): Payer: Self-pay

## 2022-10-27 MED ORDER — ESTRADIOL 0.0375 MG/24HR TD PTWK
0.0375 mg | MEDICATED_PATCH | TRANSDERMAL | 11 refills | Status: DC
  Filled 2022-10-27 – 2022-11-21 (×4): qty 4, 28d supply, fill #0
  Filled 2022-12-24: qty 4, 28d supply, fill #1
  Filled 2023-01-23: qty 4, 28d supply, fill #2
  Filled 2023-03-09: qty 4, 28d supply, fill #3
  Filled 2023-04-24: qty 4, 28d supply, fill #4
  Filled 2023-05-27: qty 4, 28d supply, fill #5
  Filled 2023-07-03: qty 4, 28d supply, fill #6
  Filled 2023-08-25: qty 4, 28d supply, fill #7

## 2022-10-28 ENCOUNTER — Other Ambulatory Visit (HOSPITAL_COMMUNITY): Payer: Self-pay

## 2022-10-28 ENCOUNTER — Ambulatory Visit (INDEPENDENT_AMBULATORY_CARE_PROVIDER_SITE_OTHER): Payer: Medicare Other | Admitting: Family Medicine

## 2022-10-28 ENCOUNTER — Encounter (INDEPENDENT_AMBULATORY_CARE_PROVIDER_SITE_OTHER): Payer: Self-pay | Admitting: Family Medicine

## 2022-10-28 VITALS — HR 71 | Temp 98.4°F | Ht 63.0 in | Wt 253.0 lb

## 2022-10-28 DIAGNOSIS — E559 Vitamin D deficiency, unspecified: Secondary | ICD-10-CM

## 2022-10-28 DIAGNOSIS — E88819 Insulin resistance, unspecified: Secondary | ICD-10-CM | POA: Diagnosis not present

## 2022-10-28 DIAGNOSIS — I5022 Chronic systolic (congestive) heart failure: Secondary | ICD-10-CM

## 2022-10-28 DIAGNOSIS — Z6841 Body Mass Index (BMI) 40.0 and over, adult: Secondary | ICD-10-CM

## 2022-10-28 MED ORDER — RYBELSUS 3 MG PO TABS
3.0000 mg | ORAL_TABLET | Freq: Every day | ORAL | 0 refills | Status: DC
Start: 2022-10-28 — End: 2022-12-04
  Filled 2022-10-28: qty 30, 30d supply, fill #0

## 2022-10-28 NOTE — Progress Notes (Signed)
Office: [phone]  /  Fax: [phone]  WEIGHT SUMMARY AND BIOMETRICS  Starting Date: 04/09/22  Starting Weight: 243lb   Weight Lost Since Last Visit: 0   Vitals Temp: 98.4 F (36.9 C) BP: -- (Patient has LVAD) Pulse Rate: 71 SpO2: 100 %   Body Composition  Body Fat %: 48.6 % Fat Mass (lbs): 123.2 lbs Muscle Mass (lbs): 123.6 lbs Total Body Water (lbs): 90.4 lbs Visceral Fat Rating : 16    HPI  Chief Complaint: OBESITY  [name] is here to discuss her progress with her obesity treatment plan. She is on the the Category 2 Plan and states she is following her eating plan approximately 75 % of the time. She states she is exercising 30 minutes 3 times per week.   Interval History:  Since last office visit she is up 2 lb She has not been eating on plan 24 hr verbal recall: 1-2 boiled eggs, toast with honey, + cup fairlife milk Sandwich on low cal bread with [place], thin sliced cheese, lettuce and tomato, mustard, apple Lean cuisine for dinner No snacks Drinks water, vitamin water zero Rybelsus was denied by insurance for [employer] to the [employer] program 3 x a week Denies cravings for sweets She is taking Torsemide every other day but she is retaining fluid in her abdomen She feels lightheaded from her Torsemide, Spironolactone, and [place]  Pharmacotherapy: unable to get Rybelsus  PHYSICAL EXAM:  Pulse 71, temperature 98.4 F (36.9 C), height 5\' 3"  (1.6 m), weight 253 lb (114.8 kg), SpO2 100 %. Body mass index is 44.82 kg/m.  General: She is overweight, cooperative, alert, well developed, and in no acute distress. PSYCH: Has normal mood, affect and thought process.   Lungs: Normal breathing effort, no conversational dyspnea.   ASSESSMENT AND PLAN  TREATMENT PLAN FOR OBESITY:  Recommended Dietary Goals  [name] is currently in the action stage of change. As such, her goal is to continue weight management plan. She has agreed to the  Category 2 Plan.  Behavioral Intervention  We discussed the following Behavioral Modification Strategies today: increasing lean protein intake, decreasing simple carbohydrates , increasing vegetables, increasing lower glycemic fruits, avoiding skipping meals, increasing water intake, work on meal planning and preparation, work on tracking and journaling calories using tracking application, continue to practice mindfulness when eating, and planning for success.  Additional resources provided today: NA  Recommended Physical Activity Goals  [name] has been advised to work up to 150 minutes of moderate intensity aerobic activity a week and strengthening exercises 2-3 times per week for cardiovascular health, weight loss maintenance and preservation of muscle mass.   She has agreed to Increase the intensity, frequency or duration of aerobic exercises    Pharmacotherapy changes for the treatment of obesity: Rybsus re- sent to [facility]  ASSOCIATED CONDITIONS ADDRESSED TODAY  Insulin resistance Assessment & Plan: Fasting insulin 17.30 March 2022.  Ozempic denied by insurance.  Rybelsus denied by insurance. She is actively working on a low sugar/ lower starch diet rich in lean protein and fiber Able to do light exercises 3 days/ wk Not a candidate for metformin.  Will retry Rybelsus RX 3 mg daily thru [facility] Continue cat 2 meal plan  Orders: -     Rybelsus; Take 1 tablet (3 mg total) by mouth daily.  Dispense: 30 tablet; Refill: 0  Obesity, Class III, BMI 40-49.9 (morbid obesity) (HCC) Assessment & Plan: Pt has failed to  see weight loss since starting here 6 mos ago She has gained 10 lb and is retaining fluid with chronic systolic heart failure She has been trying to get her BMI down for cardiac transplant, on an LVAD She appears to be undereating some days  Continue light exercise as tolerated Track daily intake on the MyFitnessPal ap with a goal of 1200 cal/  day.  This should include 90 g of protein daily. GLP-1's have been denied on multiple occasions for weight loss.    Chronic systolic heart failure (HCC) Assessment & Plan: Managed by cardiology, on LVAD and long term plan includes cardiac transplant She is retaining about 6 lb of fluid weight in the past 2 mos Her diuretics were reduced due to volume depletion.  Continue current medications per cardiology Continue to focus on hitting daily calorie/ protein targets Light exercise as tolerated   Vitamin D deficiency Assessment & Plan: Last vitamin D Lab Results  Component Value Date   VD25OH 19.1 (L) 04/09/2022   She is off of a vitamin D supplement.  Target vitamin D level is 50-70 to help with leptin resistance, immune function, bone health and energy levels.  Plan to recheck vitamin D level next visit       She was informed of the importance of frequent follow up visits to maximize her success with intensive lifestyle modifications for her multiple health conditions.   ATTESTASTION STATEMENTS:  Reviewed by clinician on day of visit: allergies, medications, problem list, medical history, surgical history, family history, social history, and previous encounter notes pertinent to obesity diagnosis.   I have personally spent 30 minutes total time today in preparation, patient care, nutritional counseling and documentation for this visit, including the following: review of clinical lab tests; review of medical tests/procedures/services.      Glennis Brink, DO DABFM, DABOM [facility] [address] [place], [place] [address] [phone]

## 2022-10-28 NOTE — Assessment & Plan Note (Signed)
Pt has failed to see weight loss since starting here 6 mos ago She has gained 10 lb and is retaining fluid with chronic systolic heart failure She has been trying to get her BMI down for cardiac transplant, on an LVAD She appears to be undereating some days  Continue light exercise as tolerated Track daily intake on the MyFitnessPal ap with a goal of 1200 cal/ day.  This should include 90 g of protein daily. GLP-1's have been denied on multiple occasions for weight loss.

## 2022-10-28 NOTE — Assessment & Plan Note (Addendum)
Last vitamin D Lab Results  Component Value Date   VD25OH 19.1 (L) 04/09/2022   She is off of a vitamin D supplement.  Target vitamin D level is 50-70 to help with leptin resistance, immune function, bone health and energy levels.  Plan to recheck vitamin D level next visit

## 2022-10-28 NOTE — Assessment & Plan Note (Signed)
Fasting insulin 17.30 March 2022.  Ozempic denied by insurance.  Rybelsus denied by insurance. She is actively working on a low sugar/ lower starch diet rich in lean protein and fiber Able to do light exercises 3 days/ wk Not a candidate for metformin.  Will retry Rybelsus RX 3 mg daily thru [facility] Continue cat 2 meal plan

## 2022-10-28 NOTE — Assessment & Plan Note (Signed)
Managed by cardiology, on LVAD and long term plan includes cardiac transplant She is retaining about 6 lb of fluid weight in the past 2 mos Her diuretics were reduced due to volume depletion.  Continue current medications per cardiology Continue to focus on hitting daily calorie/ protein targets Light exercise as tolerated

## 2022-10-29 ENCOUNTER — Other Ambulatory Visit: Payer: Self-pay

## 2022-10-29 ENCOUNTER — Ambulatory Visit
Admission: RE | Admit: 2022-10-29 | Discharge: 2022-10-29 | Disposition: A | Discharge status: Home / Self Care | Payer: Medicare Other | Source: Ambulatory Visit | Attending: Internal Medicine | Admitting: Internal Medicine

## 2022-10-29 DIAGNOSIS — R195 Other fecal abnormalities: Secondary | ICD-10-CM

## 2022-10-29 DIAGNOSIS — Z1211 Encounter for screening for malignant neoplasm of colon: Secondary | ICD-10-CM | POA: Diagnosis not present

## 2022-10-30 ENCOUNTER — Ambulatory Visit (HOSPITAL_COMMUNITY): Payer: Self-pay | Admitting: Pharmacist

## 2022-10-30 DIAGNOSIS — Z7901 Long term (current) use of anticoagulants: Secondary | ICD-10-CM | POA: Diagnosis not present

## 2022-10-30 LAB — POCT INR: INR: 2.3 (ref 2.0–3.0)

## 2022-11-05 ENCOUNTER — Other Ambulatory Visit (HOSPITAL_COMMUNITY): Payer: Self-pay | Admitting: *Deleted

## 2022-11-05 DIAGNOSIS — Z95811 Presence of heart assist device: Secondary | ICD-10-CM

## 2022-11-05 DIAGNOSIS — Z7901 Long term (current) use of anticoagulants: Secondary | ICD-10-CM

## 2022-11-06 ENCOUNTER — Other Ambulatory Visit: Payer: Self-pay | Admitting: Pharmacist

## 2022-11-06 ENCOUNTER — Ambulatory Visit (HOSPITAL_COMMUNITY): Payer: Self-pay | Admitting: Pharmacist

## 2022-11-06 ENCOUNTER — Other Ambulatory Visit: Payer: Self-pay

## 2022-11-06 ENCOUNTER — Telehealth: Payer: Self-pay

## 2022-11-06 ENCOUNTER — Other Ambulatory Visit: Payer: Self-pay | Admitting: Nurse Practitioner

## 2022-11-06 ENCOUNTER — Ambulatory Visit (INDEPENDENT_AMBULATORY_CARE_PROVIDER_SITE_OTHER): Payer: Medicare Other

## 2022-11-06 DIAGNOSIS — E785 Hyperlipidemia, unspecified: Secondary | ICD-10-CM

## 2022-11-06 DIAGNOSIS — I428 Other cardiomyopathies: Secondary | ICD-10-CM

## 2022-11-06 DIAGNOSIS — F410 Panic disorder [episodic paroxysmal anxiety] without agoraphobia: Secondary | ICD-10-CM

## 2022-11-06 LAB — CUP PACEART REMOTE DEVICE CHECK
Battery Remaining Longevity: 27 mo
Battery Voltage: 2.95 V
Brady Statistic RV Percent Paced: 0 %
Date Time Interrogation Session: 20240515061806
HighPow Impedance: 69 Ohm
Implantable Lead Connection Status: 753985
Implantable Lead Implant Date: 20150706
Implantable Lead Location: 753860
Implantable Pulse Generator Implant Date: 20150706
Lead Channel Impedance Value: 342 Ohm
Lead Channel Impedance Value: 456 Ohm
Lead Channel Pacing Threshold Amplitude: 1 V
Lead Channel Pacing Threshold Pulse Width: 0.4 ms
Lead Channel Sensing Intrinsic Amplitude: 4.875 mV
Lead Channel Sensing Intrinsic Amplitude: 4.875 mV
Lead Channel Setting Pacing Amplitude: 2 V
Lead Channel Setting Pacing Pulse Width: 0.4 ms
Lead Channel Setting Sensing Sensitivity: 0.3 mV

## 2022-11-06 LAB — POCT INR: INR: 1.1 — AB (ref 2.0–3.0)

## 2022-11-06 MED ORDER — ALPRAZOLAM 0.25 MG PO TABS
0.2500 mg | ORAL_TABLET | Freq: Two times a day (BID) | ORAL | 2 refills | Status: DC | PRN
Start: 2022-11-06 — End: 2023-09-25
  Filled 2022-11-06: qty 20, 10d supply, fill #0
  Filled 2023-04-24: qty 20, 10d supply, fill #1

## 2022-11-06 MED ORDER — ROSUVASTATIN CALCIUM 40 MG PO TABS
40.0000 mg | ORAL_TABLET | Freq: Every day | ORAL | 3 refills | Status: DC
Start: 2022-11-06 — End: 2023-11-30
  Filled 2022-11-06: qty 90, 90d supply, fill #0
  Filled 2022-12-24: qty 90, 90d supply, fill #1
  Filled 2023-01-23 – 2023-07-03 (×3): qty 90, 90d supply, fill #2
  Filled 2023-09-25: qty 90, 90d supply, fill #3

## 2022-11-06 NOTE — Progress Notes (Signed)
Pharmacy Quality Measure Review  This patient is appearing a report for being at risk of failing the adherence measure for cholesterol and diabetes (though she does not have diabetes, target medication is prescribed for HF) medications this calendar year.   Medication: rosuvastatin 40 mg Last fill date: 09/18/22 for 30 day supply  Medication: Farxiga 10 mg  Last fill date: 09/18/22 for 30 day supply   Patient has a visit with [facility] pharmacist today. Will collaborate to address adherence.   Catie Eppie Gibson, PharmD, BCACP, CPP [facility] [phone]

## 2022-11-06 NOTE — Progress Notes (Signed)
LVAD INR 

## 2022-11-06 NOTE — Telephone Encounter (Signed)
Pt has been informed via mychart

## 2022-11-06 NOTE — Telephone Encounter (Signed)
Patient states she is changing her pharmacy from [facility] to [facility] at [facility] and she needs the following prescriptions changed to this pharmacy:  rosuvastatin (CRESTOR) 40 MG tablet  ALPRAZolam (XANAX) 0.25 MG tablet

## 2022-11-10 ENCOUNTER — Other Ambulatory Visit (HOSPITAL_COMMUNITY): Payer: Self-pay

## 2022-11-10 ENCOUNTER — Other Ambulatory Visit: Payer: Self-pay

## 2022-11-10 ENCOUNTER — Ambulatory Visit (HOSPITAL_COMMUNITY): Payer: Self-pay | Admitting: Pharmacist

## 2022-11-10 ENCOUNTER — Ambulatory Visit (HOSPITAL_COMMUNITY)
Admission: RE | Admit: 2022-11-10 | Discharge: 2022-11-10 | Disposition: A | Discharge status: Home / Self Care | Payer: Medicare Other | Source: Ambulatory Visit | Attending: Cardiology | Admitting: Cardiology

## 2022-11-10 VITALS — BP 90/0 | HR 72 | Ht 63.0 in | Wt 255.4 lb

## 2022-11-10 DIAGNOSIS — Z7982 Long term (current) use of aspirin: Secondary | ICD-10-CM | POA: Insufficient documentation

## 2022-11-10 DIAGNOSIS — D649 Anemia, unspecified: Secondary | ICD-10-CM | POA: Diagnosis not present

## 2022-11-10 DIAGNOSIS — E0590 Thyrotoxicosis, unspecified without thyrotoxic crisis or storm: Secondary | ICD-10-CM | POA: Insufficient documentation

## 2022-11-10 DIAGNOSIS — Z79899 Other long term (current) drug therapy: Secondary | ICD-10-CM | POA: Diagnosis not present

## 2022-11-10 DIAGNOSIS — N183 Chronic kidney disease, stage 3 unspecified: Secondary | ICD-10-CM | POA: Insufficient documentation

## 2022-11-10 DIAGNOSIS — I081 Rheumatic disorders of both mitral and tricuspid valves: Secondary | ICD-10-CM | POA: Insufficient documentation

## 2022-11-10 DIAGNOSIS — Z8673 Personal history of transient ischemic attack (TIA), and cerebral infarction without residual deficits: Secondary | ICD-10-CM | POA: Diagnosis not present

## 2022-11-10 DIAGNOSIS — Z7984 Long term (current) use of oral hypoglycemic drugs: Secondary | ICD-10-CM | POA: Diagnosis not present

## 2022-11-10 DIAGNOSIS — Z8249 Family history of ischemic heart disease and other diseases of the circulatory system: Secondary | ICD-10-CM | POA: Insufficient documentation

## 2022-11-10 DIAGNOSIS — Z95810 Presence of automatic (implantable) cardiac defibrillator: Secondary | ICD-10-CM | POA: Insufficient documentation

## 2022-11-10 DIAGNOSIS — Z7952 Long term (current) use of systemic steroids: Secondary | ICD-10-CM | POA: Insufficient documentation

## 2022-11-10 DIAGNOSIS — Z823 Family history of stroke: Secondary | ICD-10-CM | POA: Diagnosis not present

## 2022-11-10 DIAGNOSIS — I5022 Chronic systolic (congestive) heart failure: Secondary | ICD-10-CM | POA: Diagnosis not present

## 2022-11-10 DIAGNOSIS — I493 Ventricular premature depolarization: Secondary | ICD-10-CM | POA: Diagnosis not present

## 2022-11-10 DIAGNOSIS — Z7901 Long term (current) use of anticoagulants: Secondary | ICD-10-CM | POA: Insufficient documentation

## 2022-11-10 DIAGNOSIS — Z95811 Presence of heart assist device: Secondary | ICD-10-CM | POA: Diagnosis not present

## 2022-11-10 DIAGNOSIS — I130 Hypertensive heart and chronic kidney disease with heart failure and stage 1 through stage 4 chronic kidney disease, or unspecified chronic kidney disease: Secondary | ICD-10-CM | POA: Insufficient documentation

## 2022-11-10 DIAGNOSIS — I509 Heart failure, unspecified: Secondary | ICD-10-CM | POA: Diagnosis not present

## 2022-11-10 DIAGNOSIS — Z79890 Hormone replacement therapy (postmenopausal): Secondary | ICD-10-CM | POA: Insufficient documentation

## 2022-11-10 DIAGNOSIS — I428 Other cardiomyopathies: Secondary | ICD-10-CM | POA: Insufficient documentation

## 2022-11-10 LAB — CBC
HCT: 43.5 % (ref 36.0–46.0)
Hemoglobin: 12.5 g/dL (ref 12.0–15.0)
MCH: 20.4 pg — ABNORMAL LOW (ref 26.0–34.0)
MCHC: 28.7 g/dL — ABNORMAL LOW (ref 30.0–36.0)
MCV: 70.8 fL — ABNORMAL LOW (ref 80.0–100.0)
Platelets: 273 10*3/uL (ref 150–400)
RBC: 6.14 MIL/uL — ABNORMAL HIGH (ref 3.87–5.11)
RDW: 23.5 % — ABNORMAL HIGH (ref 11.5–15.5)
WBC: 6.9 10*3/uL (ref 4.0–10.5)
nRBC: 0 % (ref 0.0–0.2)

## 2022-11-10 LAB — BASIC METABOLIC PANEL
Anion gap: 8 (ref 5–15)
BUN: 22 mg/dL — ABNORMAL HIGH (ref 6–20)
CO2: 25 mmol/L (ref 22–32)
Calcium: 9.1 mg/dL (ref 8.9–10.3)
Chloride: 103 mmol/L (ref 98–111)
Creatinine, Ser: 1.41 mg/dL — ABNORMAL HIGH (ref 0.44–1.00)
GFR, Estimated: 45 mL/min — ABNORMAL LOW (ref >60)
Glucose, Bld: 84 mg/dL (ref 70–99)
Potassium: 4 mmol/L (ref 3.5–5.1)
Sodium: 136 mmol/L (ref 135–145)

## 2022-11-10 LAB — PROTIME-INR
INR: 1.6 — ABNORMAL HIGH (ref 0.8–1.2)
Prothrombin Time: 19.3 seconds — ABNORMAL HIGH (ref 11.4–15.2)

## 2022-11-10 LAB — LACTATE DEHYDROGENASE: LDH: 193 U/L — ABNORMAL HIGH (ref 98–192)

## 2022-11-10 MED ORDER — POTASSIUM CHLORIDE CRYS ER 20 MEQ PO TBCR
60.0000 meq | EXTENDED_RELEASE_TABLET | Freq: Every day | ORAL | 3 refills | Status: DC
  Filled 2022-11-10: qty 270, 90d supply, fill #0

## 2022-11-10 MED ORDER — ASPIRIN 81 MG PO TBEC: 81.0000 mg | DELAYED_RELEASE_TABLET | Freq: Every day | ORAL | 3 refills | Status: DC

## 2022-11-10 MED ORDER — DAPAGLIFLOZIN PROPANEDIOL 10 MG PO TABS
10.0000 mg | ORAL_TABLET | Freq: Every day | ORAL | 3 refills | Status: DC
  Filled 2022-11-10: qty 90, 90d supply, fill #0
  Filled 2023-07-03: qty 90, 90d supply, fill #1
  Filled 2023-09-25: qty 90, 90d supply, fill #2
  Filled 2023-10-26: qty 90, 90d supply, fill #3

## 2022-11-10 MED ORDER — TORSEMIDE 20 MG PO TABS
20.0000 mg | ORAL_TABLET | ORAL | 3 refills | Status: DC
Start: 2022-11-10 — End: 2024-01-07
  Filled 2022-11-10: qty 45, 90d supply, fill #0
  Filled 2022-12-24 – 2023-03-09 (×2): qty 45, 90d supply, fill #1

## 2022-11-10 MED ORDER — AMIODARONE HCL 200 MG PO TABS
ORAL_TABLET | ORAL | 3 refills | Status: DC
  Filled 2022-11-10 – 2022-11-12 (×2): qty 90, 80d supply, fill #0
  Filled 2022-12-24: qty 90, 30d supply, fill #0
  Filled 2023-01-23: qty 90, 90d supply, fill #1

## 2022-11-10 MED ORDER — SPIRONOLACTONE 25 MG PO TABS
25.0000 mg | ORAL_TABLET | Freq: Every day | ORAL | 3 refills | Status: DC
Start: 2022-11-10 — End: 2023-04-28
  Filled 2022-11-10: qty 45, 45d supply, fill #0
  Filled 2022-12-24: qty 45, 45d supply, fill #1
  Filled 2023-01-23: qty 45, 45d supply, fill #2
  Filled 2023-03-09 – 2023-03-25 (×2): qty 45, 45d supply, fill #3

## 2022-11-10 MED ORDER — SILDENAFIL CITRATE 20 MG PO TABS
20.0000 mg | ORAL_TABLET | Freq: Three times a day (TID) | ORAL | 6 refills | Status: DC
Start: 2022-11-10 — End: 2024-01-07
  Filled 2022-11-10 – 2022-11-12 (×2): qty 90, 30d supply, fill #0
  Filled 2022-12-24: qty 90, 30d supply, fill #1
  Filled 2023-01-23: qty 90, 30d supply, fill #2
  Filled 2023-07-03 – 2023-07-13 (×3): qty 90, 30d supply, fill #3
  Filled 2023-08-25: qty 90, 30d supply, fill #4
  Filled 2023-09-25: qty 90, 30d supply, fill #5
  Filled 2023-10-26: qty 90, 30d supply, fill #6

## 2022-11-10 NOTE — Patient Instructions (Signed)
Pt is traveling to [facility], [place] at the end of the month. Here is the closest VAD center info:       [facility]       [phone]       [address],      [place], [place], [address] Please take Torsemide every other day Return to clinic in 2 months w.an echo

## 2022-11-10 NOTE — Progress Notes (Signed)
Patient presented for a 2 month follow up in VAD Clinic today alone. Reports no problems with VAD equipment or concerns with drive line.  Pt denies lightheadedness, dizziness, falls, shortness of breath, and signs of bleeding. She endorses good hydration.   Pt states she notices she has put on weight. Weight up 4 pounds today. Pt is participating in healthy weight and wellness program and has been able to increase her exercise routine without any limitation. Pt was prescibed Ry Pt tells me that she has been taking Torsemide everyday.  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  pt has follow up with Endocrinology 11/28/22.  Pt denies any signs and symptoms of bleeding. She did have a positive Cologuard and had a virtual colonoscopy which only showed a tortuous colon otherwise negative.  Vital Signs:  Doppler Pressure: 90  Automatc BP: UTO HR: 72 SR SPO2: UTO   Weight: 255.4 lbs w/o eqt Last weight: 251.6 lb w/o eqt  VAD Indication: Destination Therapy d/t BMI   VAD interrogation & Equipment Management: Speed: 5900 Flow: 5.2 Power: 4.8 w    PI: 3.6   Alarms: none Events: >100  Fixed speed 5900 Low speed limit: 5600   Primary Controller:  Replace back up battery in 17 months. Back up controller:   Replace back up battery in 23 months.    Annual Equipment Maintenance on UBC/PM was performed on 09/03/21.    I reviewed the LVAD parameters from today and compared the results to the patient's prior recorded data. LVAD interrogation was NEGATIVE for significant power changes, NEGATIVE for clinical alarms and STABLE for PI events/speed drops. No programming changes were made and pump is functioning within specified parameters. Pt is performing daily controller and system monitor self tests along with completing weekly and monthly maintenance for LVAD equipment.   LVAD equipment check completed and is in good working order. Back-up equipment present. Charged back up  battery and performed self-test on equipment.    Exit Site Care: CDI. Drive line anchor secure. Pt denies fever or chills. Patient given 12 weekly kits, 8 anchors and a pack of tegaderms for at home use.    Significant Events on VAD Support:    Device: Medtronic Therapies: on VF 240 VT 200 VVI: 40 Last check: 11/05/2022   BP & Labs:  Doppler 98 - unable to obtain auto cuff today   Hgb 12.5 - No S/S of bleeding. Specifically denies vaginal bleeding, melena/BRBPR or nosebleeds.    LDH stable at 193 with established baseline of 250 - 450. Denies tea-colored urine. No power elevations noted on interrogation.   Plan:  Pt is traveling to [facility], [place] at the end of the month. Here is the closest VAD center info:       [facility]       [phone]       [address],      [place], [place], [address] Please take Torsemide every other day Return to clinic in 2 months w.an echo  Carlton Adam RN,BSN VAD Coordinator  Office: [phone]  24/7 Pager: [phone]

## 2022-11-12 ENCOUNTER — Other Ambulatory Visit: Payer: Self-pay

## 2022-11-12 ENCOUNTER — Telehealth (HOSPITAL_COMMUNITY): Payer: Self-pay | Admitting: Pharmacy Technician

## 2022-11-12 NOTE — Telephone Encounter (Signed)
Patient Advocate Encounter   Received notification from [facility] Medicare that prior authorization for Sildenafil is required.   PA submitted on CoverMyMeds Key  [id number] Status is pending   Will continue to follow.

## 2022-11-13 ENCOUNTER — Ambulatory Visit (HOSPITAL_COMMUNITY): Payer: Self-pay | Admitting: Pharmacist

## 2022-11-13 ENCOUNTER — Other Ambulatory Visit: Payer: Self-pay

## 2022-11-13 LAB — POCT INR: INR: 2.1 (ref 2.0–3.0)

## 2022-11-14 ENCOUNTER — Other Ambulatory Visit (HOSPITAL_COMMUNITY): Payer: Self-pay

## 2022-11-14 NOTE — Telephone Encounter (Signed)
Advanced Heart Failure Patient Advocate Encounter  Prior Authorization for Sildenafil has been approved.    PA# [id number] Effective dates: 11/12/22 through 11/12/23  Patients co-pay is $41.34 (30 days)  Archer Asa, CPhT

## 2022-11-20 ENCOUNTER — Ambulatory Visit (HOSPITAL_COMMUNITY): Payer: Self-pay | Admitting: Pharmacist

## 2022-11-20 LAB — POCT INR: INR: 2.5 (ref 2.0–3.0)

## 2022-11-20 NOTE — Progress Notes (Signed)
Remote ICD transmission.   

## 2022-11-21 ENCOUNTER — Other Ambulatory Visit: Payer: Self-pay

## 2022-11-27 ENCOUNTER — Ambulatory Visit (HOSPITAL_COMMUNITY): Payer: Self-pay | Admitting: Pharmacist

## 2022-11-27 DIAGNOSIS — Z7901 Long term (current) use of anticoagulants: Secondary | ICD-10-CM | POA: Diagnosis not present

## 2022-11-27 LAB — POCT INR: INR: 2.9 (ref 2.0–3.0)

## 2022-11-28 ENCOUNTER — Ambulatory Visit: Payer: Medicare Other | Admitting: Internal Medicine

## 2022-11-28 ENCOUNTER — Encounter: Payer: Self-pay | Admitting: Internal Medicine

## 2022-11-28 ENCOUNTER — Other Ambulatory Visit: Payer: Self-pay

## 2022-11-28 ENCOUNTER — Ambulatory Visit (HOSPITAL_COMMUNITY)
Admission: RE | Admit: 2022-11-28 | Discharge: 2022-11-28 | Disposition: A | Discharge status: Home / Self Care | Payer: Medicare Other | Source: Ambulatory Visit | Attending: Cardiology | Admitting: Cardiology

## 2022-11-28 VITALS — Ht 63.0 in | Wt 261.8 lb

## 2022-11-28 DIAGNOSIS — T827XXA Infection and inflammatory reaction due to other cardiac and vascular devices, implants and grafts, initial encounter: Secondary | ICD-10-CM | POA: Diagnosis not present

## 2022-11-28 DIAGNOSIS — E064 Drug-induced thyroiditis: Secondary | ICD-10-CM | POA: Diagnosis not present

## 2022-11-28 DIAGNOSIS — T462X5A Adverse effect of other antidysrhythmic drugs, initial encounter: Secondary | ICD-10-CM

## 2022-11-28 DIAGNOSIS — Y828 Other medical devices associated with adverse incidents: Secondary | ICD-10-CM | POA: Insufficient documentation

## 2022-11-28 DIAGNOSIS — Z95811 Presence of heart assist device: Secondary | ICD-10-CM | POA: Diagnosis not present

## 2022-11-28 LAB — T4, FREE: Free T4: 1.04 ng/dL (ref 0.60–1.60)

## 2022-11-28 LAB — T3, FREE: T3, Free: 2.6 pg/mL (ref 2.3–4.2)

## 2022-11-28 LAB — TSH: TSH: 5.79 u[IU]/mL — ABNORMAL HIGH (ref 0.35–5.50)

## 2022-11-28 MED ORDER — CEFADROXIL 500 MG PO CAPS
1000.0000 mg | ORAL_CAPSULE | Freq: Two times a day (BID) | ORAL | 0 refills | Status: AC
Start: 2022-11-28 — End: 2022-12-12
  Filled 2022-11-28: qty 56, 14d supply, fill #0

## 2022-11-28 MED ORDER — METHIMAZOLE 5 MG PO TABS: 5.0000 mg | ORAL_TABLET | ORAL | 1 refills | Status: DC

## 2022-11-28 NOTE — Progress Notes (Signed)
Pt seen by Kirt Boys RN VAD clinic for bleeding from drive line site. See her note for details.      Discussed with Dr Shirlee Latch. Order received for Cefadroxil 1000 mg BID x 14 days. (Prescription sent to [facility] at [facility] per pt request). Will have her change dressing daily using daily kit. Will see her back in clinic in 1 week for f/u appt.   Called and spoke with pt regarding the above plan. Will see in clinic 6/14 at 10 AM. Advised to page VAD coordinator over the weekend if drainage/symptoms worsen. She verbalized understanding to all instructions.   Alyce Pagan RN VAD Coordinator  Office: [phone]  24/7 Pager: [phone]

## 2022-11-28 NOTE — Progress Notes (Signed)
Pt presented to VAD Clinic reporting her drive line site started bleeding yesterday. Her husband changed dressing, placing 2 x 2's under Sorbaview window. Those are saturated with serosanguinous drainage at this time.   Pt does report skin under dressing stays irritated.  Exit Site Care:  Existing VAD dressing removed and site care performed using sterile technique. Drive line exit site cleaned with Vashe solution, allowed to dry, and Vashe soaked 2 x 2's placed around exit site/drive line. Gauze dressing applied. Exit site healed and incorporated, the velour is fully implanted at exit site. No redness, tenderness, foul odor, rash, or tunneling noted. Drive line anchor re-applied. Pt denies fever or chills.    Provided patient with 7 daily dressing kits, 7 anchors, and Vashe cleansing solution.   Alyce Pagan, VAD Coordinator will review with provider and call patient back with any further instructions and follow up appointments. Pt verbalized understanding of same.  Hessie Diener RN, VAD Coordinator [phone]

## 2022-11-28 NOTE — Progress Notes (Signed)
Name: [name]  MRN/ DOB: [id number], 08-11-1970    Age/ Sex: 52 y.o., female    PCP: Bethanie Dicker, NP   Reason for Endocrinology Evaluation: Hyperthyroid     Date of Initial Endocrinology Evaluation: 10/08/2021    HPI: [name] is a 51 y.o. female with a past medical history of CHF, nonischemic cardiomyopathy (S/P ICD placement and LVAD), CVA, pulmonary HTN, paroxysmal A-fib. The patient presented for initial endocrinology clinic visit on 4/18/2023for consultative assistance with her Hyperthyroidism.   Patient with extensive cardiac history to include A-fib, and nonischemic cardiomyopathy (s/p insertion of ICD) as well as left MCA territory CVA, s/p thrombectomy and stent placement.   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 08/2021 No FH of thyroid disease  No prior thyroid /neck sx   She was already on methimazole and prednisone on her initial visit with me which we have adjusted   Prednisone was stopped by 08/2022  SUBJECTIVE:    Today (11/28/22): [name] is here for follow-up on hyperthyroidism   She continues to follow-up with cardiology for LVAD She continues to follow-up with the [place] [facility] weight and wellness clinic, she has been noted with weight gain, her insurance stopped covering GLP-1 agonist  Amiodarone dose has been decreasing to once daily Weight has been trending up Denies local neck swelling  Palpitations are improving  Denies tremors  Constipation resolved     Methimazole 5 mg, 4 days a week ( Mon- Thur)   HISTORY:  Past Medical History:  Past Medical History:  Diagnosis Date   Anemia    Automatic implantable cardioverter-defibrillator in situ    B12 deficiency    Cancer (HCC)    Chronic CHF (congestive heart failure) (HCC)    a. EF 15-20% b. RHC (09/2013) RA 14, RV 57/22, PA 64/36 (48), PCWP 18, FIck CO/CI 3.7/1.6, PVR 8.1 WU, PA sat 47%    Heart valve problem    History of heart attack    History of hypothyroidism    History of stomach ulcers    HLD (hyperlipidemia)    Hyperthyroidism    Hypotension    Hypothyroidism    Morbid obesity (HCC)    Myocardial infarction (HCC) 08/2013   Nocturnal dyspnea    Nonischemic cardiomyopathy (HCC)    Palpitations    Sinus tachycardia    Sleep apnea    Snoring-prob OSA 09/04/2011   SOB (shortness of breath)    Sprint Fidelis ICD lead RECALL  6949    Stroke (HCC) 2021   some residual right-sided weakness   UARS (upper airway resistance syndrome) 09/04/2011   HST 12/2013:  AHI 4/hr (numerous episodes of airflow reduction that did not have concomitant desaturation)    Vitamin D deficiency    Past Surgical History:  Past Surgical History:  Procedure Laterality Date   BREATH TEK H PYLORI N/A 11/09/2014   Procedure: BREATH TEK H PYLORI;  Surgeon: Gaynelle Adu, MD;  Location: [facility];  Service: General;  Laterality: N/A;  CARDIAC CATHETERIZATION  ~ 2006; 09/2013   CARDIAC CATHETERIZATION N/A 05/23/2016   Procedure: Right Heart Cath;  Surgeon: Laurey Morale, MD;  Location: [facility] INVASIVE CV LAB;  Service: Cardiovascular;  Laterality: N/A;   CARDIAC DEFIBRILLATOR PLACEMENT  2006; 12/26/2013   Medtronic [id number]; 12-2013 ICD gen change and RV lead revision with new 6935 RV lead by Dr Graciela Husbands   CENTRAL LINE INSERTION  08/28/2021   Procedure: CENTRAL LINE INSERTION;  Surgeon: Laurey Morale, MD;  Location: [facility] INVASIVE CV LAB;  Service: Cardiovascular;;   CESAREAN SECTION  06/23/1997    IMPLANTABLE CARDIOVERTER DEFIBRILLATOR GENERATOR CHANGE N/A 12/26/2013   Procedure: IMPLANTABLE CARDIOVERTER DEFIBRILLATOR GENERATOR CHANGE;  Surgeon: Duke Salvia, MD;  Location: [facility] CATH LAB;  Service: Cardiovascular;  Laterality: N/A;   INSERTION OF IMPLANTABLE LEFT VENTRICULAR ASSIST DEVICE N/A 09/03/2021   Procedure: INSERTION OF IMPLANTABLE LEFT VENTRICULAR ASSIST DEVICE/ Heartmate 3;  Surgeon: Lovett Sox, MD;  Location: [facility] OR;  Service: Open Heart Surgery;  Laterality: N/A;   IR CT HEAD LTD  04/17/2020   IR CT HEAD LTD  04/17/2020   IR INTRA CRAN STENT  04/17/2020   IR PERCUTANEOUS ART THROMBECTOMY/INFUSION INTRACRANIAL INC DIAG ANGIO  04/17/2020   IR RADIOLOGIST EVAL & MGMT  06/05/2020   LEAD REVISION N/A 12/26/2013   Procedure: LEAD REVISION;  Surgeon: Duke Salvia, MD;  Location: [facility] CATH LAB;  Service: Cardiovascular;  Laterality: N/A;   LEFT VENTRICULAR ASSIST DEVICE     PLACEMENT OF IMPELLA LEFT VENTRICULAR ASSIST DEVICE Right 08/29/2021   Procedure: PLACEMENT OF IMPELLA 5.5 LEFT VENTRICULAR ASSIST DEVICE;  Surgeon: Alleen Borne, MD;  Location: [facility] OR;  Service: Open Heart Surgery;  Laterality: Right;   RADIOLOGY WITH ANESTHESIA N/A 04/17/2020   Procedure: Code Stroke;  Surgeon: Julieanne Cotton, MD;  Location: [facility] OR;  Service: Radiology;  Laterality: N/A;   REMOVAL OF IMPELLA LEFT VENTRICULAR ASSIST DEVICE N/A 09/03/2021   Procedure: REMOVAL OF IMPELLA LEFT VENTRICULAR ASSIST DEVICE;  Surgeon: Lovett Sox, MD;  Location: [facility] OR;  Service: Open Heart Surgery;  Laterality: N/A;   RIGHT HEART CATH N/A 08/28/2021   Procedure: RIGHT HEART CATH;  Surgeon: Laurey Morale, MD;  Location: [facility] INVASIVE CV LAB;  Service: Cardiovascular;  Laterality: N/A;   RIGHT HEART CATHETERIZATION N/A 02/15/2014   Procedure: RIGHT HEART CATH;  Surgeon: Laurey Morale, MD;  Location: [facility] CATH LAB;  Service: Cardiovascular;  Laterality: N/A;   ROBOTIC ASSISTED TOTAL HYSTERECTOMY WITH  BILATERAL SALPINGO OOPHERECTOMY N/A 02/05/2022   Procedure: XI ROBOTIC ASSISTED TOTAL HYSTERECTOMY WITH BILATERAL SALPINGO OOPHORECTOMY, CYSTOSCOPY, UTERUS GREATER THAN 250 GRAMS;  Surgeon: Carver Fila, MD;  Location: [facility] OR;  Service: Gynecology;  Laterality: N/A;   TEE WITHOUT CARDIOVERSION N/A 08/29/2021   Procedure: TRANSESOPHAGEAL ECHOCARDIOGRAM (TEE);  Surgeon: Alleen Borne, MD;  Location: [facility] OR;  Service: Open Heart Surgery;  Laterality: N/A;   TEE WITHOUT CARDIOVERSION N/A 09/03/2021   Procedure: TRANSESOPHAGEAL ECHOCARDIOGRAM (TEE);  Surgeon: Lovett Sox, MD;  Location: [facility] OR;  Service: Open Heart Surgery;  Laterality: N/A;   TUBAL LIGATION  06/23/1997    Social History:  reports that she has never smoked. She has never used smokeless tobacco. She reports that she does not drink alcohol and does not use drugs. Family History: family history includes Heart Problems in her mother; Heart disease in her maternal grandmother and mother; High blood pressure in her father; Hypertension in her father and mother; Obesity in her mother; Stroke  in her father.   HOME MEDICATIONS: Allergies as of 11/28/2022   No Known Allergies      Medication List        Accurate as of November 28, 2022 11:30 AM. If you have any questions, ask your nurse or doctor.          ALPRAZolam 0.25 MG tablet Commonly known as: XANAX Take 1 tablet (0.25 mg total) by mouth 2 (two) times daily as needed for anxiety.   amiodarone 200 MG tablet Commonly known as: PACERONE Take 1 tablet (200 mg total) by mouth 2 (two) times daily for 10 days, THEN 1 tablet (200 mg total) daily thereafter Start taking on: Nov 10, 2022   aspirin EC 81 MG tablet Take 1 tablet (81 mg total) by mouth daily. Swallow whole.   estradiol 0.0375 mg/24hr patch Commonly known as: CLIMARA - Dosed in mg/24 hr Place 1 patch (0.0375 mg total) onto the skin every 7 (seven) days.   Farxiga 10 MG Tabs tablet Generic drug:  dapagliflozin propanediol Take 1 tablet (10 mg total) by mouth daily before breakfast.   methimazole 5 MG tablet Commonly known as: TAPAZOLE Take 1 tablet (5 mg total) by mouth as directed. Methimazole 5 mg, four days a week   potassium chloride SA 20 MEQ tablet Commonly known as: KLOR-CON M Take 3 tablets (60 mEq total) by mouth daily.   quiniDINE gluconate 324 MG CR tablet Take 1 tablet (324 mg total) by mouth 2 (two) times daily.   rosuvastatin 40 MG tablet Commonly known as: CRESTOR Take 1 tablet (40 mg total) by mouth daily.   Rybelsus 3 MG Tabs Generic drug: Semaglutide Take 1 tablet (3 mg total) by mouth daily.   sildenafil 20 MG tablet Commonly known as: REVATIO Take 1 tablet (20 mg total) by mouth 3 (three) times daily.   spironolactone 25 MG tablet Commonly known as: ALDACTONE Take 1 tablet (25 mg total) by mouth daily. Restart on 02/10/22.   torsemide 20 MG tablet Commonly known as: DEMADEX Take 1 tablet (20 mg total) by mouth every other day. Take 1 tablet (20 mg) by mouth as needed, or as directed by heart failure clinic.   warfarin 2 MG tablet Commonly known as: COUMADIN Take as directed by the anticoagulation clinic. If you are unsure how to take this medication, talk to your nurse or doctor. Original instructions: Take 2 tablets (4 mg total) by mouth daily at 4 PM. Take 4mg  daily  or as directed by LVAD Clinic.          REVIEW OF SYSTEMS: A comprehensive ROS was conducted with the patient and is negative except as per HPI    OBJECTIVE:  VS: Ht 5\' 3"  (1.6 m)   Wt 261 lb 12.8 oz (118.8 kg)   BMI 46.38 kg/m    Wt Readings from Last 3 Encounters:  11/28/22 261 lb 12.8 oz (118.8 kg)  11/10/22 255 lb 6.4 oz (115.8 kg)  10/28/22 253 lb (114.8 kg)     EXAM: General: Pt appears well and is in NAD  Eyes: External eye exam normal without stare, lid lag or exophthalmos.  EOM intact.    Neck: General: Supple without adenopathy. Thyroid: Thyroid size  normal.  No goiter or nodules appreciated.   Lungs: Clear with good BS bilat   Extremities:  BL LE: No pretibial edema  Mental Status: Judgment, insight: Intact Orientation: Oriented to time, place, and person Mood and affect: No depression, anxiety, or agitation  DATA REVIEWED:    Latest Reference Range & Units 11/28/22 11:57  TSH 0.35 - 5.50 uIU/mL 5.79 (H)  Triiodothyronine,Free,Serum 2.3 - 4.2 pg/mL 2.6  T4,Free(Direct) 0.60 - 1.60 ng/dL 1.61  (H): Data is abnormally high   Latest Reference Range & Units 03/27/22 10:10  Sodium 135 - 145 mmol/L 139  Potassium 3.5 - 5.1 mmol/L 3.6  Chloride 98 - 111 mmol/L 104  CO2 22 - 32 mmol/L 25  Glucose 70 - 99 mg/dL 90  BUN 6 - 20 mg/dL 26 (H)  Creatinine 0.96 - 1.00 mg/dL 0.45 (H)  Calcium 8.9 - 10.3 mg/dL 9.7  Anion gap 5 - 15  10  Alkaline Phosphatase 38 - 126 U/L 72  Albumin 3.5 - 5.0 g/dL 4.2  AST 15 - 41 U/L 23  ALT 0 - 44 U/L 19  Total Protein 6.5 - 8.1 g/dL 7.4  Total Bilirubin 0.3 - 1.2 mg/dL 0.5  PREALBUMIN 18 - 38 mg/dL 23  GFR, Estimated >40 mL/min 45 (L)     Latest Reference Range & Units 03/27/22 10:10  WBC 4.0 - 10.5 K/uL 6.2  RBC 3.87 - 5.11 MIL/uL 4.88  Hemoglobin 12.0 - 15.0 g/dL 98.1 (L)  HCT 19.1 - 47.8 % 38.2  MCV 80.0 - 100.0 fL 78.3 (L)  MCH 26.0 - 34.0 pg 24.0 (L)  MCHC 30.0 - 36.0 g/dL 29.5  RDW 62.1 - 30.8 % 15.5  Platelets 150 - 400 K/uL 264  nRBC 0.0 - 0.2 % 0.0      Thyroid ultrasound 08/27/2021  Right thyroid lobe is moderately heterogeneous with at least 2 small nodules, largest measuring 0.5 cm. These nodules do not meet criteria for biopsy or dedicated follow-up.   No definite thyroid tissue in the expected region of the left thyroid lobe. 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   IMPRESSION: 1. 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 2. Thyroid tissue is heterogeneous with small nodules. These nodules do not  meet criteria for biopsy or dedicated follow-up.     ASSESSMENT/PLAN/RECOMMENDATIONS:   Amiodarone-induced thyroid disease:   -The patient was initially diagnosed with hypothyroidism and was on LT-for replacement for decades prior to developing hyperthyroidism in 08/2021 -The patient was on amiodarone from 07/2018 until 09/2021, restarted again in 08/2022 -LT-4 replacement was discontinued and she has been on methimazole since 08/26/2021 as well as prednisone -Prednisone was discontinued by March 2024 -TSH elevated today, will decrease methimazole as below   Medications : Decrease methimazole 5 mg, 1 tablet 2 days weekly    Follow-up in 6 months Labs in 2 months  Signed electronically by: Lyndle Herrlich, MD  [facility] Endocrinology  [facility] [address] [place] [place], [place] [address] Phone: [phone] FAX: [phone]   CC: Bethanie Dicker, NP [address] [place] [address] Phone: [phone] Fax: [phone]   Return to Endocrinology clinic as below: Future Appointments  Date Time Provider Department Center  12/04/2022  8:40 AM Cathey Endow, Scot Jun, DO [facility] None  12/16/2022 10:20 AM Duke Salvia, MD [facility] None  01/02/2023  8:40 AM Bethanie Dicker, NP [facility] PEC  01/12/2023  8:00 AM [facility] ECHO OP 1 [facility]  01/12/2023  9:00 AM Laurey Morale, MD [facility] None  02/05/2023  7:05 AM [facility] DEVICE REMOTES [facility]  03/10/2023 10:45 AM [facility] LAB [facility] None  03/10/2023 11:00 AM Warner Mccreedy D, NP [facility]NL None  05/07/2023  7:05  AM [facility] DEVICE REMOTES [facility]  08/06/2023  7:05 AM [facility] DEVICE REMOTES [facility]  10/21/2023  1:30 PM [facility] ANNUAL WELLNESS VISIT [facility]  11/05/2023  7:05 AM [facility] DEVICE REMOTES [facility]

## 2022-11-29 ENCOUNTER — Other Ambulatory Visit (HOSPITAL_COMMUNITY): Payer: Self-pay

## 2022-12-01 ENCOUNTER — Other Ambulatory Visit (HOSPITAL_COMMUNITY): Payer: Self-pay

## 2022-12-04 ENCOUNTER — Ambulatory Visit (INDEPENDENT_AMBULATORY_CARE_PROVIDER_SITE_OTHER): Payer: Medicare Other | Admitting: Family Medicine

## 2022-12-04 ENCOUNTER — Encounter (INDEPENDENT_AMBULATORY_CARE_PROVIDER_SITE_OTHER): Payer: Self-pay | Admitting: Family Medicine

## 2022-12-04 ENCOUNTER — Ambulatory Visit (HOSPITAL_COMMUNITY): Payer: Self-pay | Admitting: Pharmacist

## 2022-12-04 VITALS — HR 74 | Temp 97.9°F | Ht 63.0 in | Wt 254.0 lb

## 2022-12-04 DIAGNOSIS — E559 Vitamin D deficiency, unspecified: Secondary | ICD-10-CM

## 2022-12-04 DIAGNOSIS — Z6841 Body Mass Index (BMI) 40.0 and over, adult: Secondary | ICD-10-CM

## 2022-12-04 DIAGNOSIS — I5022 Chronic systolic (congestive) heart failure: Secondary | ICD-10-CM | POA: Diagnosis not present

## 2022-12-04 DIAGNOSIS — E88819 Insulin resistance, unspecified: Secondary | ICD-10-CM | POA: Diagnosis not present

## 2022-12-04 DIAGNOSIS — E0590 Thyrotoxicosis, unspecified without thyrotoxic crisis or storm: Secondary | ICD-10-CM | POA: Diagnosis not present

## 2022-12-04 LAB — POCT INR: INR: 2.6 (ref 2.0–3.0)

## 2022-12-04 NOTE — Progress Notes (Signed)
Office: [phone]  /  Fax: [phone]  WEIGHT SUMMARY AND BIOMETRICS  Starting Date: 04/09/22  Starting Weight: 243 lb   Weight Lost Since Last Visit: 0   Vitals Temp: 97.9 F (36.6 C) BP: -- (pt has a LVAD) Pulse Rate: 74 SpO2: 100 %   Body Composition  Body Fat %: 47 % Fat Mass (lbs): 119.8 lbs Muscle Mass (lbs): 128.2 lbs Total Body Water (lbs): 85.8 lbs Visceral Fat Rating : 16     HPI  Chief Complaint: OBESITY  [place] is here to discuss her progress with her obesity treatment plan. She is on the the Category 2 Plan and states she is following her eating plan approximately 75 % of the time. She states she is not exercising.   Interval History:  Since last office visit she is up 1 lb She has come down in water weight and body fat and gained muscle mass in the past month She has been on vacation to the beach her anniversary She had a site infection with her LVAD requiring antibiotics She has follow up with Dr Shirlee Latch tomorrow She is up 11 lb in the past 8 mos of medically supervised weight management She has been trying to lose weight to have cardiac transplant  Rybelsus was denied She has been having eggs - 2, + low calorie toast for breakfast (9am) At 1 pm, she has a Lean Cuisine + apple or melon or a sandwich + lean deli meat and fruit or leftovers + veggies/ sweets She may have a greek yogurt or a Yasso bar in the afternoon Dinner has been pork tenderloin or chicken breast or steak or shrimp, a vegetable and sometimes a starch Reviewed food products that are higher in protein and fiber in lower and refined carbohydrate and added sugar.  TSH rose to 5.79 with endocrinology, treated for hyperthyroidism  Pharmacotherapy: None  PHYSICAL EXAM:  Pulse 74, temperature 97.9 F (36.6 C), height 5\' 3"  (1.6 m), weight 254 lb (115.2 kg), SpO2 100 %. Body mass index is 44.99 kg/m.  General: She is overweight, cooperative, alert, well developed, and in no  acute distress. PSYCH: Has normal mood, affect and thought process.   Lungs: Normal breathing effort, no conversational dyspnea.   ASSESSMENT AND PLAN  TREATMENT PLAN FOR OBESITY:  Recommended Dietary Goals  [place]leigh is currently in the action stage of change. As such, her goal is to continue weight management plan. She has agreed to keeping a food journal and adhering to recommended goals of 1500 calories and 90 to 110 g of protein.  Behavioral Intervention  We discussed the following Behavioral Modification Strategies today: increasing lean protein intake, decreasing simple carbohydrates , increasing vegetables, increasing lower glycemic fruits, increasing water intake, work on meal planning and preparation, work on Counselling psychologist calories using tracking application, keeping healthy foods at home, continue to practice mindfulness when eating, and planning for success.  Additional resources provided today: NA  Recommended Physical Activity Goals  [place] has been advised to work up to 150 minutes of moderate intensity aerobic activity a week and strengthening exercises 2-3 times per week for cardiovascular health, weight loss maintenance and preservation of muscle mass.   She has agreed to Think about ways to increase daily physical activity and overcoming barriers to exercise  Pharmacotherapy changes for the treatment of obesity: None  ASSOCIATED CONDITIONS ADDRESSED TODAY  Insulin resistance Assessment & Plan: Homa IR score 3.7 consistent with significant insulin resistance.  Patient is not  a good candidate for metformin use given her chronic systolic heart failure.  Her insurance has continued to deny Ozempic and Rybelsus for insulin resistance without a diagnosis of type 2 diabetes.  She is actively working on reducing her intake of added sugar and refined carbohydrates.  Exercise has been limited lately due to an infection.  We discussed her 24-hour food recall in  detail. Avoid foods and drinks with over 8 g of sugar per serving.  Increase walking time as tolerated to 15 minutes daily.  This may be broken up into small increments.   Obesity, Class III, BMI 40-49.9 (morbid obesity) (HCC)  SYSTOLIC HEART FAILURE, CHRONIC Assessment & Plan: She is managed by Dr. [facility], currently using an LVAD.  She needs BMI reduction for cardiac transplant but is considering not going this route due to risk of surgery.  Her fluid weight is down today.  She is feeling less bloated.  She is currently on torsemide 20 mg every other day, spironolactone 25 mg daily, amiodarone 200 mg daily, aspirin 81 mg daily, warfarin 4 mg daily.  Last echocardiogram from August 2023 shows a left ventricular ejection fraction less than 20%.  An updated echocardiogram has been ordered.  Her energy level has reduced since her last visit due to an infection, on antibiotics. Continue current medications.  Continue active plan for weight reduction.  Recommend dietary logging to ensure she is hitting her target goal for calories daily.   Hyperthyroidism Assessment & Plan: Managed by Dr. Lonzo Cloud, her methimazole was recently reduced to 5 mg 2 days a week, separated.  She does have some complaints of fatigue.  She has noticed weight gain with her TSH rising over 5.  Continue current plan per endocrinology with follow-up TSH.   Vitamin D deficiency Assessment & Plan: Last vitamin D Lab Results  Component Value Date   VD25OH 19.1 (L) 04/09/2022   Her last vitamin D was low at 19.1 and she has been off a vitamin D supplement.  She does have complaints of fatigue.  Will plan to update her vitamin D level next visit.  Goal over 50.       She was informed of the importance of frequent follow up visits to maximize her success with intensive lifestyle modifications for her multiple health conditions.   ATTESTASTION STATEMENTS:  Reviewed by clinician on day of visit: allergies,  medications, problem list, medical history, surgical history, family history, social history, and previous encounter notes pertinent to obesity diagnosis.   I have personally spent 30 minutes total time today in preparation, patient care, nutritional counseling and documentation for this visit, including the following: review of clinical lab tests; review of medical tests/procedures/services.      Glennis Brink, DO DABFM, DABOM [facility] [address] [place], [place] [address] [phone]

## 2022-12-04 NOTE — Assessment & Plan Note (Signed)
Managed by Dr. Lonzo Cloud, her methimazole was recently reduced to 5 mg 2 days a week, separated.  She does have some complaints of fatigue.  She has noticed weight gain with her TSH rising over 5.  Continue current plan per endocrinology with follow-up TSH.

## 2022-12-04 NOTE — Assessment & Plan Note (Signed)
Homa IR score 3.7 consistent with significant insulin resistance.  Patient is not a good candidate for metformin use given her chronic systolic heart failure.  Her insurance has continued to deny Ozempic and Rybelsus for insulin resistance without a diagnosis of type 2 diabetes.  She is actively working on reducing her intake of added sugar and refined carbohydrates.  Exercise has been limited lately due to an infection.  We discussed her 24-hour food recall in detail. Avoid foods and drinks with over 8 g of sugar per serving.  Increase walking time as tolerated to 15 minutes daily.  This may be broken up into small increments.

## 2022-12-04 NOTE — Assessment & Plan Note (Signed)
Last vitamin D Lab Results  Component Value Date   VD25OH 19.1 (L) 04/09/2022   Her last vitamin D was low at 19.1 and she has been off a vitamin D supplement.  She does have complaints of fatigue.  Will plan to update her vitamin D level next visit.  Goal over 50.

## 2022-12-04 NOTE — Assessment & Plan Note (Signed)
She is managed by Dr. [facility] Mons, currently using an LVAD.  She needs BMI reduction for cardiac transplant but is considering not going this route due to risk of surgery.  Her fluid weight is down today.  She is feeling less bloated.  She is currently on torsemide 20 mg every other day, spironolactone 25 mg daily, amiodarone 200 mg daily, aspirin 81 mg daily, warfarin 4 mg daily.  Last echocardiogram from August 2023 shows a left ventricular ejection fraction less than 20%.  An updated echocardiogram has been ordered.  Her energy level has reduced since her last visit due to an infection, on antibiotics. Continue current medications.  Continue active plan for weight reduction.  Recommend dietary logging to ensure she is hitting her target goal for calories daily.

## 2022-12-05 ENCOUNTER — Ambulatory Visit (HOSPITAL_COMMUNITY)
Admission: RE | Admit: 2022-12-05 | Discharge: 2022-12-05 | Disposition: A | Discharge status: Home / Self Care | Payer: Medicare Other | Source: Ambulatory Visit | Attending: Cardiology | Admitting: Cardiology

## 2022-12-05 DIAGNOSIS — Z4801 Encounter for change or removal of surgical wound dressing: Secondary | ICD-10-CM | POA: Insufficient documentation

## 2022-12-05 DIAGNOSIS — Z95811 Presence of heart assist device: Secondary | ICD-10-CM | POA: Diagnosis not present

## 2022-12-05 DIAGNOSIS — T827XXA Infection and inflammatory reaction due to other cardiac and vascular devices, implants and grafts, initial encounter: Secondary | ICD-10-CM

## 2022-12-05 NOTE — Progress Notes (Signed)
Pt presented to [facility] for wound check. Was started on Cefadroxil 1000 mg BID x 14 days last Friday for blood/serosanguinous drive line drainage.   Tolerating Cefadroxil well. Changing dressing daily. Notes scant serous drainage on gauze. Stats [name] was able to express scant amount of drainage earlier this week while cleaning site. Unable to express drainage today.   Pt does report skin under dressing stays irritated.She attributes this to the "pants" part of dressing.   Exit Site Care:  Existing VAD dressing removed and site care performed using sterile technique. Drive line exit site cleaned with Vashe solution, allowed to dry, and Vashe soaked 2 x 2's placed around exit site/drive line. Gauze dressing applied. Exit site healed and incorporated, the velour is fully implanted at exit site. No redness, tenderness, foul odor, rash, or tunneling noted. Scant serosanguinous drainage on previous dressing. Drive line anchor re-applied. Pt denies fever or chills. Provided patient with 7 daily dressing kits and 4 anchors for home use.      Plan: Continue Cefadroxil (end date 12/12/22) May advance to every other day dressing changes. If increased drainage present return to daily dressing changes Return to clinic in 1 week for wound check   Alyce Pagan RN VAD Coordinator  Office: [phone]  24/7 Pager: [phone]

## 2022-12-11 ENCOUNTER — Ambulatory Visit (HOSPITAL_COMMUNITY): Payer: Self-pay | Admitting: Pharmacist

## 2022-12-11 ENCOUNTER — Encounter (HOSPITAL_COMMUNITY): Payer: Medicare Other

## 2022-12-11 LAB — POCT INR: INR: 2.3 (ref 2.0–3.0)

## 2022-12-12 ENCOUNTER — Ambulatory Visit (HOSPITAL_COMMUNITY)
Admission: RE | Admit: 2022-12-12 | Discharge: 2022-12-12 | Disposition: A | Discharge status: Home / Self Care | Payer: Medicare Other | Source: Ambulatory Visit | Attending: Internal Medicine | Admitting: Internal Medicine

## 2022-12-12 DIAGNOSIS — Z4801 Encounter for change or removal of surgical wound dressing: Secondary | ICD-10-CM | POA: Diagnosis not present

## 2022-12-12 DIAGNOSIS — T827XXA Infection and inflammatory reaction due to other cardiac and vascular devices, implants and grafts, initial encounter: Secondary | ICD-10-CM

## 2022-12-12 NOTE — Progress Notes (Signed)
Pt presented to [facility] Clinic for wound check and dressing change. Was started on Cefadroxil 1000 mg BID x 14 days last Friday for blood/serosanguinous drive line drainage.   Pt completed Cefadroxil today. She reports scant drainage with every other day dressing changes. She does report skin irritation with paper tape, pants, and achor. She is using silk tape over gauze and using silk tape under achor.   Exit Site Care:  Existing VAD dressing removed and site care performed using sterile technique. Drive line exit site cleaned with Vashe solution, allowed to dry, and Sorbaview dressing with biopatch placed over site.   Exit site healed and incorporated, the velour is fully implanted at exit site. No drainage, redness, tenderness, foul odor, rash, or tunneling noted. Drive line anchor re-applied. Pt denies fever or chills. Provided patient with 4 weekly kits for home use.      Plan: May advance to weekly dressing changes. If increased drainage presents after completing antibiotic, return to daily dressing changes and call VAD Coordinator.  If skin irritation occurs with Sorbaview dressing, may switch to daily dressing kit and change weekly if no drainage present. Return to scheduled [facility] Clinic appointment next month. Call VAD Coordinator if you need to be seen sooner.   Hessie Diener RN VAD Coordinator  Office: [phone]  24/7 Pager: [phone]

## 2022-12-16 ENCOUNTER — Ambulatory Visit: Payer: Medicare Other | Attending: Internal Medicine | Admitting: Internal Medicine

## 2022-12-16 ENCOUNTER — Encounter: Payer: Self-pay | Admitting: Internal Medicine

## 2022-12-16 VITALS — BP 98/0 | HR 59 | Ht 63.0 in | Wt 265.0 lb

## 2022-12-16 DIAGNOSIS — I472 Ventricular tachycardia, unspecified: Secondary | ICD-10-CM

## 2022-12-16 DIAGNOSIS — Z4502 Encounter for adjustment and management of automatic implantable cardiac defibrillator: Secondary | ICD-10-CM | POA: Diagnosis not present

## 2022-12-16 DIAGNOSIS — I428 Other cardiomyopathies: Secondary | ICD-10-CM | POA: Diagnosis not present

## 2022-12-16 DIAGNOSIS — I5022 Chronic systolic (congestive) heart failure: Secondary | ICD-10-CM

## 2022-12-16 NOTE — Patient Instructions (Signed)
Medication Instructions:  Your physician recommends that you continue on your current medications as directed. Please refer to the Current Medication list given to you today.   *If you need a refill on your cardiac medications before your next appointment, please call your pharmacy*   Lab Work: Please complete a CBC with Diff at your next Heart Failure Clinic appt. If you have labs (blood work) drawn today and your tests are completely normal, you will receive your results only by: MyChart Message (if you have MyChart) OR A paper copy in the mail If you have any lab test that is abnormal or we need to change your treatment, we will call you to review the results.   Testing/Procedures: None ordered.    Follow-Up: At [facility], you and your health needs are our priority.  As part of our continuing mission to provide you with exceptional heart care, we have created designated Provider Care Teams.  These Care Teams include your primary Cardiologist (physician) and Advanced Practice Providers (APPs -  Physician Assistants and Nurse Practitioners) who all work together to provide you with the care you need, when you need it.  We recommend signing up for the patient portal called "MyChart".  Sign up information is provided on this After Visit Summary.  MyChart is used to connect with patients for Virtual Visits (Telemedicine).  Patients are able to view lab/test results, encounter notes, upcoming appointments, etc.  Non-urgent messages can be sent to your provider as well.   To learn more about what you can do with MyChart, go to [email or web address].    Your next appointment:   6 months with Dr Graciela Husbands

## 2022-12-16 NOTE — Progress Notes (Signed)
Patient ID: [name], female   DOB: 08/03/1970, 52 y.o.   MRN: [id number]       Patient Care Team: Bethanie Dicker, NP as PCP - General (Nurse Practitioner) Laurey Morale, MD as PCP - Cardiology (Cardiology) Duke Salvia, MD as PCP - Electrophysiology (Cardiology) Quintella Reichert, MD as PCP - Sleep Medicine (Cardiology) Alden Hipp, RPH-CPP (Pharmacist)   HPI  [name] is a 52 y.o. female seen in follow-up for  ICD implanted for nonischemic cardiomyopathy.  Associated with frequent PVCs  she has a history of ventricular tachycardia with polymorphic and monomorphic treated by her device and is on amiodarone.  Had been  on adjunctive mexiletine which because of recurrent ventricular tachycardia 2/22 was transitioned to quinidine following conversation with colleagues; ablation was not recommended;  LVAD 3/23  Recurrent shocks in the setting of hypokalemia most recently 8/21 VT therapy 08/12/20 at which time potassium was presumed normal, 2/15--3.9  &  3/1--4.1 2/24 had fast VT treated with ATP; (prior chart erroneously describes it as a shock) amiodarone previously discontinued was resumed .  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  Question cardioembolic.  Underwent stenting.  On DAPT.  To see neuroradiology soon with the question of discontinuing Brilinta   The patient denies chest pain, nocturnal dyspnea, orthopnea or peripheral edema.  There have been no palpitations, lightheadedness or syncope.  Complains of chronic shortness of breath  Significant weight gain and she has been hypothyroid.  She still remains on methimazole twice a week.  I presume she is being weaned off.Marland Kitchen   DATE TEST EF%    4/15 LHC (CE)  Cors w/out obstruction  8/15 Echo   20 %    12/17 Cardio Cath (CE)    10/19 Echo  20-25% MR mod  10/21 Echo  20-25%    1/22 Echo 20-25%   5/22 Echo 25%   3/23 Echo  <20%      Date PVCs     /2016 1%    2/20  11%--VTNS Monomorphic pop  VTNS -MM and PM   3/20 13.3% Judi Cong morphic population 7.& 4 % Total  1/21 6.2%     Date Cr K TSH LFTs Hgb WBC Platelets  4/20 1.37 4.0 12.027 26       11/21  1.28 3.2 (adjusted) 3.1 14 9.6    5/22 1.42 3.9 4.06 (2/22) 14 10.8(4/22) 4.9K (4/22)   5/23 0.85 3.3 0.013  11.8 7.2 (4/23) 250 (4/23)  5/24 1.41 4.0 5.79  12.5  273   Antiarrhythmics Date   Amio Stopped and resumed    Mex  ineffective  Quinidine           Records and Results Reviewed   Past Medical History:  Diagnosis Date   Anemia    Automatic implantable cardioverter-defibrillator in situ    B12 deficiency    Cancer (HCC)    Chronic CHF (congestive heart failure) (HCC)    a. EF 15-20% b. RHC (09/2013) RA 14, RV 57/22, PA 64/36 (48), PCWP 18, FIck CO/CI 3.7/1.6, PVR 8.1 WU, PA sat 47%    Heart valve problem    History of heart attack    History of hypothyroidism    History of stomach ulcers    HLD (hyperlipidemia)    Hyperthyroidism    Hypotension    Hypothyroidism    Morbid obesity (HCC)    Myocardial infarction (HCC) 08/2013  Nocturnal dyspnea    Nonischemic cardiomyopathy (HCC)    Palpitations    Sinus tachycardia    Sleep apnea    Snoring-prob OSA 09/04/2011   SOB (shortness of breath)    Sprint Fidelis ICD lead RECALL  6949    Stroke (HCC) 2021   some residual right-sided weakness   UARS (upper airway resistance syndrome) 09/04/2011   HST 12/2013:  AHI 4/hr (numerous episodes of airflow reduction that did not have concomitant desaturation)    Vitamin D deficiency     Past Surgical History:  Procedure Laterality Date   BREATH TEK H PYLORI N/A 11/09/2014   Procedure: BREATH TEK H PYLORI;  Surgeon: Gaynelle Adu, MD;  [facility];  Service: General;  Laterality: N/A;   CARDIAC CATHETERIZATION  ~ 2006; 09/2013   CARDIAC CATHETERIZATION N/A 05/23/2016   Procedure: Right Heart Cath;  Surgeon: Laurey Morale, MD;  Location: [facility] INVASIVE CV LAB;  Service: Cardiovascular;   Laterality: N/A;   CARDIAC DEFIBRILLATOR PLACEMENT  2006; 12/26/2013   Medtronic [id number]; 12-2013 ICD gen change and RV lead revision with new 6935 RV lead by Dr Graciela Husbands   CENTRAL LINE INSERTION  08/28/2021   Procedure: CENTRAL LINE INSERTION;  Surgeon: Laurey Morale, MD;  Location: [facility] INVASIVE CV LAB;  Service: Cardiovascular;;   CESAREAN SECTION  06/23/1997   IMPLANTABLE CARDIOVERTER DEFIBRILLATOR GENERATOR CHANGE N/A 12/26/2013   Procedure: IMPLANTABLE CARDIOVERTER DEFIBRILLATOR GENERATOR CHANGE;  Surgeon: Duke Salvia, MD;  Location: [facility] CATH LAB;  Service: Cardiovascular;  Laterality: N/A;   INSERTION OF IMPLANTABLE LEFT VENTRICULAR ASSIST DEVICE N/A 09/03/2021   Procedure: INSERTION OF IMPLANTABLE LEFT VENTRICULAR ASSIST DEVICE/ Heartmate 3;  Surgeon: Lovett Sox, MD;  Location: [facility] OR;  Service: Open Heart Surgery;  Laterality: N/A;   IR CT HEAD LTD  04/17/2020   IR CT HEAD LTD  04/17/2020   IR INTRA CRAN STENT  04/17/2020   IR PERCUTANEOUS ART THROMBECTOMY/INFUSION INTRACRANIAL INC DIAG ANGIO  04/17/2020   IR RADIOLOGIST EVAL & MGMT  06/05/2020   LEAD REVISION N/A 12/26/2013   Procedure: LEAD REVISION;  Surgeon: Duke Salvia, MD;  Location: [facility] CATH LAB;  Service: Cardiovascular;  Laterality: N/A;   LEFT VENTRICULAR ASSIST DEVICE     PLACEMENT OF IMPELLA LEFT VENTRICULAR ASSIST DEVICE Right 08/29/2021   Procedure: PLACEMENT OF IMPELLA 5.5 LEFT VENTRICULAR ASSIST DEVICE;  Surgeon: Alleen Borne, MD;  Location: [facility] OR;  Service: Open Heart Surgery;  Laterality: Right;   RADIOLOGY WITH ANESTHESIA N/A 04/17/2020   Procedure: Code Stroke;  Surgeon: Julieanne Cotton, MD;  Location: [facility] OR;  Service: Radiology;  Laterality: N/A;   REMOVAL OF IMPELLA LEFT VENTRICULAR ASSIST DEVICE N/A 09/03/2021   Procedure: REMOVAL OF IMPELLA LEFT VENTRICULAR ASSIST DEVICE;  Surgeon: Lovett Sox, MD;  Location: [facility] OR;  Service: Open Heart Surgery;  Laterality: N/A;   RIGHT HEART CATH N/A  08/28/2021   Procedure: RIGHT HEART CATH;  Surgeon: Laurey Morale, MD;  Location: [facility] INVASIVE CV LAB;  Service: Cardiovascular;  Laterality: N/A;   RIGHT HEART CATHETERIZATION N/A 02/15/2014   Procedure: RIGHT HEART CATH;  Surgeon: Laurey Morale, MD;  Location: [facility] CATH LAB;  Service: Cardiovascular;  Laterality: N/A;   ROBOTIC ASSISTED TOTAL HYSTERECTOMY WITH BILATERAL SALPINGO OOPHERECTOMY N/A 02/05/2022   Procedure: XI ROBOTIC ASSISTED TOTAL HYSTERECTOMY WITH BILATERAL SALPINGO OOPHORECTOMY, CYSTOSCOPY, UTERUS GREATER THAN 250 GRAMS;  Surgeon: Carver Fila, MD;  Location: [facility] OR;  Service: Gynecology;  Laterality: N/A;  TEE WITHOUT CARDIOVERSION N/A 08/29/2021   Procedure: TRANSESOPHAGEAL ECHOCARDIOGRAM (TEE);  Surgeon: Alleen Borne, MD;  Location: [facility] OR;  Service: Open Heart Surgery;  Laterality: N/A;   TEE WITHOUT CARDIOVERSION N/A 09/03/2021   Procedure: TRANSESOPHAGEAL ECHOCARDIOGRAM (TEE);  Surgeon: Lovett Sox, MD;  Location: [facility] OR;  Service: Open Heart Surgery;  Laterality: N/A;   TUBAL LIGATION  06/23/1997    No outpatient medications have been marked as taking for the 12/16/22 encounter (Office Visit) with Duke Salvia, MD.    No Known Allergies  Review of Systems negative except from HPI and PMH  Physical Exam: Pulse (!) 59   Ht 5\' 3"  (1.6 m)   Wt 265 lb (120.2 kg)   LMP 03/28/2021 (Approximate)   SpO2 92%   BMI 46.94 kg/m  Well developed and well nourished in no acute distress HENT normal Neck supple with JVP-flat Clear Device pocket well healed; without hematoma or erythema.  There is no tethering  Regular rate and rhythm, LVAD hum no  murmur Abd-soft with active BS No Clubbing cyanosis  edema Skin-warm and dry A & Oriented  Grossly normal sensory and motor function  ECG A pacing   Device function is normal. Programming changes none  See Paceart for details   Multiple episodes of very fast ventricular tachycardia treated with ATP 2/24.   None of these were cleanly terminated but all of them terminated spontaneously before second therapy was necessary.   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  We reviewed the potential benefits of an LVAD in terms of reducing the VT burden secondary to electromechanical interactions; moreover, the need for therapy is reduced because of hemodynamic support of the LVAD.  With the discontinuation of amiodarone, her dig level will decrease, have discussed this with Dr. DM we will increase it back to 0.125 mg daily.  We will check a trough level in about 1 week.  Given the presence of the LVAD and the history of shock therapy, we will eliminate the shocks in the VT zone-make sure to mostly be awake with recurrent ventricular tachycardia given the presence of the LVAD.  At that point she can be sedated and shocked semi-emergently.  Pt heart failure status is stable. Continue torsemide 40 mg.   Last K was low, ascribed to not taking her K supplement that day, repeat draw scheduled for tomorrow

## 2022-12-18 ENCOUNTER — Ambulatory Visit (HOSPITAL_COMMUNITY): Payer: Self-pay | Admitting: Pharmacist

## 2022-12-18 LAB — POCT INR: INR: 2.6 (ref 2.0–3.0)

## 2022-12-24 ENCOUNTER — Ambulatory Visit (HOSPITAL_COMMUNITY): Payer: Self-pay | Admitting: Pharmacist

## 2022-12-24 ENCOUNTER — Other Ambulatory Visit: Payer: Self-pay

## 2022-12-24 DIAGNOSIS — Z7901 Long term (current) use of anticoagulants: Secondary | ICD-10-CM | POA: Diagnosis not present

## 2022-12-24 LAB — POCT INR: INR: 2.4 (ref 2.0–3.0)

## 2022-12-26 ENCOUNTER — Other Ambulatory Visit: Payer: Self-pay

## 2023-01-01 ENCOUNTER — Ambulatory Visit (HOSPITAL_COMMUNITY): Payer: Self-pay | Admitting: Pharmacist

## 2023-01-01 LAB — POCT INR: INR: 2.1 (ref 2.0–3.0)

## 2023-01-02 ENCOUNTER — Telehealth: Payer: Self-pay

## 2023-01-02 ENCOUNTER — Ambulatory Visit (INDEPENDENT_AMBULATORY_CARE_PROVIDER_SITE_OTHER): Payer: Medicare Other | Admitting: Nurse Practitioner

## 2023-01-02 ENCOUNTER — Encounter: Payer: Self-pay | Admitting: Pharmacist

## 2023-01-02 ENCOUNTER — Other Ambulatory Visit: Payer: Self-pay

## 2023-01-02 ENCOUNTER — Encounter: Payer: Self-pay | Admitting: Nurse Practitioner

## 2023-01-02 ENCOUNTER — Other Ambulatory Visit (HOSPITAL_COMMUNITY): Payer: Self-pay

## 2023-01-02 VITALS — HR 41 | Temp 98.3°F | Ht 63.0 in | Wt 264.0 lb

## 2023-01-02 DIAGNOSIS — I428 Other cardiomyopathies: Secondary | ICD-10-CM

## 2023-01-02 DIAGNOSIS — E88819 Insulin resistance, unspecified: Secondary | ICD-10-CM

## 2023-01-02 DIAGNOSIS — Z6841 Body Mass Index (BMI) 40.0 and over, adult: Secondary | ICD-10-CM | POA: Diagnosis not present

## 2023-01-02 DIAGNOSIS — E0590 Thyrotoxicosis, unspecified without thyrotoxic crisis or storm: Secondary | ICD-10-CM

## 2023-01-02 DIAGNOSIS — E559 Vitamin D deficiency, unspecified: Secondary | ICD-10-CM | POA: Diagnosis not present

## 2023-01-02 DIAGNOSIS — Z1231 Encounter for screening mammogram for malignant neoplasm of breast: Secondary | ICD-10-CM

## 2023-01-02 DIAGNOSIS — E785 Hyperlipidemia, unspecified: Secondary | ICD-10-CM

## 2023-01-02 DIAGNOSIS — I5022 Chronic systolic (congestive) heart failure: Secondary | ICD-10-CM | POA: Diagnosis not present

## 2023-01-02 DIAGNOSIS — Z95811 Presence of heart assist device: Secondary | ICD-10-CM

## 2023-01-02 LAB — CBC WITH DIFFERENTIAL/PLATELET
Basophils Absolute: 0.1 10*3/uL (ref 0.0–0.1)
Basophils Relative: 0.9 % (ref 0.0–3.0)
Eosinophils Absolute: 0 10*3/uL (ref 0.0–0.7)
Eosinophils Relative: 0 % (ref 0.0–5.0)
HCT: 40.5 % (ref 36.0–46.0)
Hemoglobin: 12.3 g/dL (ref 12.0–15.0)
Lymphocytes Relative: 23.9 % (ref 12.0–46.0)
Lymphs Abs: 1.5 10*3/uL (ref 0.7–4.0)
MCHC: 30.3 g/dL (ref 30.0–36.0)
MCV: 71 fl — ABNORMAL LOW (ref 78.0–100.0)
Monocytes Absolute: 0.6 10*3/uL (ref 0.1–1.0)
Monocytes Relative: 9.8 % (ref 3.0–12.0)
Neutro Abs: 4 10*3/uL (ref 1.4–7.7)
Neutrophils Relative %: 65.4 % (ref 43.0–77.0)
Platelets: 238 10*3/uL (ref 150.0–400.0)
RBC: 5.71 Mil/uL — ABNORMAL HIGH (ref 3.87–5.11)
RDW: 22.7 % — ABNORMAL HIGH (ref 11.5–15.5)
WBC: 6.2 10*3/uL (ref 4.0–10.5)

## 2023-01-02 LAB — COMPREHENSIVE METABOLIC PANEL
ALT: 20 U/L (ref 0–35)
AST: 23 U/L (ref 0–37)
Albumin: 4.2 g/dL (ref 3.5–5.2)
Alkaline Phosphatase: 88 U/L (ref 39–117)
BUN: 22 mg/dL (ref 6–23)
CO2: 26 mEq/L (ref 19–32)
Calcium: 9.6 mg/dL (ref 8.4–10.5)
Chloride: 106 mEq/L (ref 96–112)
Creatinine, Ser: 1.3 mg/dL — ABNORMAL HIGH (ref 0.40–1.20)
GFR: 47.49 mL/min — ABNORMAL LOW (ref >60.00)
Glucose, Bld: 77 mg/dL (ref 70–99)
Potassium: 4 mEq/L (ref 3.5–5.1)
Sodium: 139 mEq/L (ref 135–145)
Total Bilirubin: 0.3 mg/dL (ref 0.2–1.2)
Total Protein: 7.6 g/dL (ref 6.0–8.3)

## 2023-01-02 LAB — LIPID PANEL
Cholesterol: 204 mg/dL — ABNORMAL HIGH (ref 0–200)
HDL: 73.3 mg/dL (ref >39.00)
LDL Cholesterol: 115 mg/dL — ABNORMAL HIGH (ref 0–99)
NonHDL: 130.38
Total CHOL/HDL Ratio: 3
Triglycerides: 77 mg/dL (ref 0.0–149.0)
VLDL: 15.4 mg/dL (ref 0.0–40.0)

## 2023-01-02 LAB — VITAMIN D 25 HYDROXY (VIT D DEFICIENCY, FRACTURES): VITD: 18.27 ng/mL — ABNORMAL LOW (ref 30.00–100.00)

## 2023-01-02 LAB — HEMOGLOBIN A1C: Hgb A1c MFr Bld: 5.5 % (ref 4.6–6.5)

## 2023-01-02 MED ORDER — WEGOVY 1 MG/0.5ML ~~LOC~~ SOAJ
1.0000 mg | SUBCUTANEOUS | 0 refills | Status: DC
Start: 2023-01-02 — End: 2023-04-28
  Filled 2023-01-02: qty 2, fill #0
  Filled 2023-03-09 – 2023-03-25 (×2): qty 2, 28d supply, fill #0

## 2023-01-02 MED ORDER — WEGOVY 0.25 MG/0.5ML ~~LOC~~ SOAJ
0.2500 mg | SUBCUTANEOUS | 0 refills | Status: DC
Start: 2023-01-02 — End: 2023-04-22
  Filled 2023-01-02 – 2023-03-09 (×2): qty 2, 28d supply, fill #0

## 2023-01-02 MED ORDER — WEGOVY 0.5 MG/0.5ML ~~LOC~~ SOAJ
0.5000 mg | SUBCUTANEOUS | 0 refills | Status: DC
Start: 2023-01-02 — End: 2023-04-22
  Filled 2023-01-02: qty 2, fill #0
  Filled 2023-01-23: qty 2, 28d supply, fill #0

## 2023-01-02 NOTE — Telephone Encounter (Signed)
Answered questions  PA submitted 

## 2023-01-02 NOTE — Patient Instructions (Signed)
YOUR MAMMOGRAM IS DUE, PLEASE CALL AND GET THIS SCHEDULED! [facility]  - call [phone]   

## 2023-01-02 NOTE — Progress Notes (Unsigned)
Bethanie Dicker, NP-C Phone: [phone]  [name] is a 52 y.o. female who presents today for follow up.   HYPERLIPIDEMIA Symptoms Chest pain on exertion:  No   Leg claudication:   No Medications: Compliance- Crestor Right upper quadrant pain- No  Muscle aches- No Lipid Panel     Component Value Date/Time   CHOL 204 (H) 01/02/2023 0926   CHOL 171 04/09/2022 1147   CHOL 183 09/20/2013 0424   TRIG 77.0 01/02/2023 0926   TRIG 105 09/20/2013 0424   HDL 73.30 01/02/2023 0926   HDL 62 04/09/2022 1147   HDL 40 09/20/2013 0424   CHOLHDL 3 01/02/2023 0926   VLDL 15.4 01/02/2023 0926   VLDL 21 09/20/2013 0424   LDLCALC 115 (H) 01/02/2023 0926   LDLCALC 94 04/09/2022 1147   LDLCALC 122 (H) 09/20/2013 0424   LABVLDL 15 04/09/2022 1147    Insulin resistance/Obesity- Working with [facility] for weight loss. Patient needing BMI below 35 to have cardiac transplant. They attempted to put her on Wegovy and Ryblesus to help with weight loss but her insurance denied it. She has been having difficulty losing weight. She has also been having difficulty with her thyroid, fluctuating TSH levels which has contributed to her weight gain. She is working with Endocrinology on this.   Social History   Tobacco Use  Smoking Status Never  Smokeless Tobacco Never    Current Outpatient Medications on File Prior to Visit  Medication Sig Dispense Refill   ALPRAZolam (XANAX) 0.25 MG tablet Take 1 tablet (0.25 mg total) by mouth 2 (two) times daily as needed for anxiety. 20 tablet 2   amiodarone (PACERONE) 200 MG tablet Take 1 tablet (200 mg total) by mouth 2 (two) times daily for 10 days, THEN 1 tablet (200 mg total) daily thereafter 90 tablet 3   aspirin EC 81 MG tablet Take 1 tablet (81 mg total) by mouth daily. Swallow whole. 90 tablet 3   dapagliflozin propanediol (FARXIGA) 10 MG TABS tablet Take 1 tablet (10 mg total) by mouth daily before breakfast. 90 tablet 3   estradiol (CLIMARA  - DOSED IN MG/24 HR) 0.0375 mg/24hr patch Place 1 patch (0.0375 mg total) onto the skin every 7 (seven) days. 4 patch 11   methimazole (TAPAZOLE) 5 MG tablet Take 1 tablet (5 mg total) by mouth as directed. Methimazole 5 mg, two days a week 48 tablet 1   potassium chloride SA (KLOR-CON M) 20 MEQ tablet Take 3 tablets (60 mEq total) by mouth daily. 270 tablet 3   quiniDINE gluconate 324 MG CR tablet Take 1 tablet (324 mg total) by mouth 2 (two) times daily. 60 tablet 6   rosuvastatin (CRESTOR) 40 MG tablet Take 1 tablet (40 mg total) by mouth daily. 90 tablet 3   sildenafil (REVATIO) 20 MG tablet Take 1 tablet (20 mg total) by mouth 3 (three) times daily. 90 tablet 6   spironolactone (ALDACTONE) 25 MG tablet Take 1 tablet (25 mg total) by mouth daily. Restart on 02/10/22. 45 tablet 3   torsemide (DEMADEX) 20 MG tablet Take 1 tablet (20 mg total) by mouth every other day. Take 1 tablet (20 mg) by mouth as needed, or as directed by heart failure clinic. 180 tablet 3   warfarin (COUMADIN) 2 MG tablet Take 2 tablets (4 mg total) by mouth daily at 4 PM. Take 4mg  daily  or as directed by LVAD Clinic. 180 tablet 5   No current facility-administered medications  on file prior to visit.    ROS see history of present illness  Objective  Physical Exam Vitals:   01/02/23 0829  Pulse: (!) 41  Temp: 98.3 F (36.8 C)  SpO2: 97%    BP Readings from Last 3 Encounters:  12/16/22 (!) 98/0  11/10/22 (!) 90/0  09/19/22 (!) 105/56   Wt Readings from Last 3 Encounters:  01/02/23 264 lb (119.7 kg)  12/16/22 265 lb (120.2 kg)  12/04/22 254 lb (115.2 kg)    Physical Exam Constitutional:      General: She is not in acute distress.    Appearance: Normal appearance.  HENT:     Head: Normocephalic.  Cardiovascular:     Comments: Mechanical heart sounds with LVAD present.  Pulmonary:     Effort: Pulmonary effort is normal. No respiratory distress.     Breath sounds: Normal breath sounds.  Skin:     General: Skin is warm and dry.  Neurological:     Mental Status: She is alert.  Psychiatric:        Mood and Affect: Mood normal.        Behavior: Behavior normal.    Assessment/Plan: Please see individual problem list.  Hyperlipidemia, unspecified hyperlipidemia type Assessment & Plan: Chronic. Currently on Crestor 40 mg daily, has not been consistent with taking her medication. Encouraged to start taking her medication daily as prescribed. Will check lipid panel today.  Orders: -     Lipid panel  Morbid obesity with BMI of 45.0-49.9, adult [facility]) Assessment & Plan: Patient has been actively working to reduce her BMI for cardiac transplant, has an LVAD. She has been working with [facility] who has tried multiple times to get her a GLP-1, which has been denied by her insurance. Samples of Wegovy for one month provided to patient today in office. Will send in new Rx of Wegovy and make PA team aware in hopes that we can get it approved by her insurance. Counseled on the risk of pancreatitis and gallbladder disease. Discussed the risk of nausea. Advised to discontinue the [facility] and contact us immediately if they develop abdominal pain. If they develop excessive nausea they will contact us right away. I discussed that medullary thyroid cancer has been seen in rats studies. The patient confirmed no personal or family history of thyroid cancer, parathyroid cancer, or adrenal gland cancer. Discussed that we thus far have not seen medullary thyroid cancer result from use of this type of medication in humans. Advised to monitor the thyroid area and contact us for any lumps, swelling, trouble swallowing, or any other changes in this area.  Discussed goal weight loss of 1 to 2 pounds a week while on this medication. Discussed the importance of healthy diet, exercise and lifestyle modifications even with this medication. She will continue to work with [facility]. Will  continue to monitor.   Orders: -     Hemoglobin A1c -     Wegovy; Inject 0.25 mg into the skin once a week for 4 weeks then increase to 0.5 mg  Dispense: 2 mL; Refill: 0 -     ZOXWRU; Inject 0.5 mg into the skin once a week for 4 weeks then increase to 1 mg  Dispense: 2 mL; Refill: 0 -     EAVWUJ; Inject 1 mg into the skin once a week.  Dispense: 2 mL; Refill: 0  NICM (nonischemic cardiomyopathy) (HCC) Assessment & Plan: Closely followed by Cardiology. Has  an LVAD. Needs cardiac transplant. Working on BMI reduction in order to have transplant. Encouraged to continue working on healthy diet and exercise.   Orders: -     Wegovy; Inject 0.25 mg into the skin once a week for 4 weeks then increase to 0.5 mg  Dispense: 2 mL; Refill: 0 -     YNWGNF; Inject 0.5 mg into the skin once a week for 4 weeks then increase to 1 mg  Dispense: 2 mL; Refill: 0 -     AOZHYQ; Inject 1 mg into the skin once a week.  Dispense: 2 mL; Refill: 0  SYSTOLIC HEART FAILURE, CHRONIC Assessment & Plan: Closely followed by Cardiology. Has an LVAD. Needs cardiac transplant. Working on BMI reduction in order to have transplant. Encouraged to continue working on healthy diet and exercise. She will continue her current medication regimen. Follow up with Cardiology as scheduled.   Orders: -     CBC with Differential/Platelet -     Comprehensive metabolic panel -     [id number]; Inject 0.25 mg into the skin once a week for 4 weeks then increase to 0.5 mg  Dispense: 2 mL; Refill: 0 -     MVHQIO; Inject 0.5 mg into the skin once a week for 4 weeks then increase to 1 mg  Dispense: 2 mL; Refill: 0 -     NGEXBM; Inject 1 mg into the skin once a week.  Dispense: 2 mL; Refill: 0  LVAD (left ventricular assist device) present (HCC) -     WUXLKG; Inject 0.25 mg into the skin once a week for 4 weeks then increase to 0.5 mg  Dispense: 2 mL; Refill: 0 -     MWNUUV; Inject 0.5 mg into the skin once a week for 4 weeks then increase to 1 mg   Dispense: 2 mL; Refill: 0 -     OZDGUY; Inject 1 mg into the skin once a week.  Dispense: 2 mL; Refill: 0  Insulin resistance Assessment & Plan: Will check A1c today. Encouraged to continue working on healthy diet and exercise.   Orders: -     Hemoglobin A1c -     Wegovy; Inject 0.25 mg into the skin once a week for 4 weeks then increase to 0.5 mg  Dispense: 2 mL; Refill: 0 -     QIHKVQ; Inject 0.5 mg into the skin once a week for 4 weeks then increase to 1 mg  Dispense: 2 mL; Refill: 0 -     QVZDGL; Inject 1 mg into the skin once a week.  Dispense: 2 mL; Refill: 0  Hyperthyroidism Assessment & Plan: Methimazole recently changed to two days per week. 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 Encouraged to follow up with Endocrinology as scheduled.     Vitamin D deficiency Assessment & Plan: Chronic issue. Has been taking OTC supplement daily due to insurance not covering the once weekly dosing. Will check vitamin D level today, if lower will try to resend the once weekly dose.   Orders: -     VITAMIN D 25 Hydroxy (Vit-D Deficiency, Fractures)  Screening mammogram for breast cancer -     3D Screening Mammogram, Left and Right; Future   Return in about 6 months (around 07/05/2023) for Follow up.   Bethanie Dicker, NP-C [facility] - [employer]

## 2023-01-02 NOTE — Telephone Encounter (Signed)
Pt needs a PA for Wegovy  Provider: Kacy Lester, NP 

## 2023-01-02 NOTE — Telephone Encounter (Signed)
Pharmacy Patient Advocate Encounter   Received notification from Pt Calls Messages that prior authorization for [facility] 0.25MG /0.5ML auto-injectors is required/requested.   Insurance verification completed.   The patient is insured through [employer] .   Per test claim: PA required   PA started via CoverMyMeds. KEY [id number] . Waiting for clinical questions to populate.

## 2023-01-02 NOTE — Telephone Encounter (Signed)
Medication Samples have been provided to the patient.  Drug name: ZOXWRU       Strength: 0.25        Qty: 1 box ( 4 pens)  LOT: [id number]  Exp.Date: 04-23-23  Dosing instructions: inject 0.25 mg into the skin weekly  The patient has been instructed regarding the correct time, dose, and frequency of taking this medication, including desired effects and most common side effects.   Donavan Foil 9:22 AM 01/02/2023

## 2023-01-02 NOTE — Telephone Encounter (Signed)
PA request has been Submitted. New Encounter created for follow up. For additional info see Pharmacy Prior Auth telephone encounter from 01/02/23.

## 2023-01-03 ENCOUNTER — Other Ambulatory Visit (HOSPITAL_COMMUNITY): Payer: Self-pay

## 2023-01-03 NOTE — Telephone Encounter (Signed)
Pharmacy Patient Advocate Encounter  Received notification from [facility] Medicare  that Prior Authorization for Wegovy 0.25MG /0.5ML auto-injectors has been APPROVED from 01-02-2023 to 01-02-2024.Marland Kitchen  PA #/Case ID/Reference #: Key: [id number]  Copay is $246.81 per 28 days. Patient in coverage gap/donut hole

## 2023-01-05 ENCOUNTER — Other Ambulatory Visit: Payer: Self-pay

## 2023-01-05 ENCOUNTER — Other Ambulatory Visit: Payer: Self-pay | Admitting: Nurse Practitioner

## 2023-01-05 DIAGNOSIS — E559 Vitamin D deficiency, unspecified: Secondary | ICD-10-CM

## 2023-01-05 MED ORDER — VITAMIN D (ERGOCALCIFEROL) 1.25 MG (50000 UNIT) PO CAPS
50000.0000 [IU] | ORAL_CAPSULE | ORAL | 1 refills | Status: DC
Start: 2023-01-05 — End: 2023-07-09
  Filled 2023-01-05: qty 13, 90d supply, fill #0
  Filled 2023-04-24: qty 13, 90d supply, fill #1

## 2023-01-07 ENCOUNTER — Encounter: Payer: Self-pay | Admitting: Nurse Practitioner

## 2023-01-07 NOTE — Assessment & Plan Note (Signed)
Will check A1c today. Encouraged to continue working on healthy diet and exercise.

## 2023-01-07 NOTE — Assessment & Plan Note (Signed)
Patient has been actively working to reduce her BMI for cardiac transplant, has an LVAD. She has been working with [facility] who has tried multiple times to get her a GLP-1, which has been denied by her insurance. Samples of Wegovy for one month provided to patient today in office. Will send in new Rx of Wegovy and make PA team aware in hopes that we can get it approved by her insurance. Counseled on the risk of pancreatitis and gallbladder disease. Discussed the risk of nausea. Advised to discontinue the [facility] and contact us immediately if they develop abdominal pain. If they develop excessive nausea they will contact us right away. I discussed that medullary thyroid cancer has been seen in rats studies. The patient confirmed no personal or family history of thyroid cancer, parathyroid cancer, or adrenal gland cancer. Discussed that we thus far have not seen medullary thyroid cancer result from use of this type of medication in humans. Advised to monitor the thyroid area and contact us for any lumps, swelling, trouble swallowing, or any other changes in this area.  Discussed goal weight loss of 1 to 2 pounds a week while on this medication. Discussed the importance of healthy diet, exercise and lifestyle modifications even with this medication. She will continue to work with [facility]. Will continue to monitor.

## 2023-01-07 NOTE — Assessment & Plan Note (Signed)
Chronic issue. Has been taking OTC supplement daily due to insurance not covering the once weekly dosing. Will check vitamin D level today, if lower will try to resend the once weekly dose.

## 2023-01-07 NOTE — Assessment & Plan Note (Signed)
Chronic. Currently on Crestor 40 mg daily, has not been consistent with taking her medication. Encouraged to start taking her medication daily as prescribed. Will check lipid panel today.

## 2023-01-07 NOTE — Assessment & Plan Note (Signed)
Closely followed by Cardiology. Has an LVAD. Needs cardiac transplant. Working on BMI reduction in order to have transplant. Encouraged to continue working on healthy diet and exercise.

## 2023-01-07 NOTE — Assessment & Plan Note (Signed)
Closely followed by Cardiology. Has an LVAD. Needs cardiac transplant. Working on BMI reduction in order to have transplant. Encouraged to continue working on healthy diet and exercise. She will continue her current medication regimen. Follow up with Cardiology as scheduled.

## 2023-01-07 NOTE — Assessment & Plan Note (Signed)
Methimazole recently changed to two days per week. 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 Encouraged to follow up with Endocrinology as scheduled.

## 2023-01-08 ENCOUNTER — Ambulatory Visit (INDEPENDENT_AMBULATORY_CARE_PROVIDER_SITE_OTHER): Payer: Medicare Other | Admitting: Family Medicine

## 2023-01-08 ENCOUNTER — Encounter (INDEPENDENT_AMBULATORY_CARE_PROVIDER_SITE_OTHER): Payer: Self-pay | Admitting: Family Medicine

## 2023-01-08 ENCOUNTER — Ambulatory Visit (HOSPITAL_COMMUNITY): Payer: Self-pay | Admitting: Pharmacist

## 2023-01-08 VITALS — HR 66 | Temp 97.6°F | Ht 63.0 in | Wt 255.0 lb

## 2023-01-08 DIAGNOSIS — I428 Other cardiomyopathies: Secondary | ICD-10-CM

## 2023-01-08 DIAGNOSIS — E559 Vitamin D deficiency, unspecified: Secondary | ICD-10-CM | POA: Diagnosis not present

## 2023-01-08 DIAGNOSIS — N1831 Chronic kidney disease, stage 3a: Secondary | ICD-10-CM | POA: Diagnosis not present

## 2023-01-08 DIAGNOSIS — E669 Obesity, unspecified: Secondary | ICD-10-CM | POA: Diagnosis not present

## 2023-01-08 DIAGNOSIS — Z6841 Body Mass Index (BMI) 40.0 and over, adult: Secondary | ICD-10-CM

## 2023-01-08 LAB — POCT INR: INR: 2.9 (ref 2.0–3.0)

## 2023-01-08 NOTE — Assessment & Plan Note (Signed)
Managed by Dr Graciela Husbands, has LVAD and would need BMI reduction to have cardiac transplant in the future.  She remains undecided if that is what she wants to do but has been fairly stable on current medications with LVAD.  She is able to walk 20-25 min on a treadmill without CP or DOE.    She recently qualified for [facility] with cardiac indications.  Improving BMI is expected to improve her cardiac health as well.  Continue to work on a reduced kcal diet focused on fiber and lean protein with meals.  Referred to RD today and continue current meds per cardiology.

## 2023-01-08 NOTE — Assessment & Plan Note (Signed)
Last vitamin D Lab Results  Component Value Date   VD25OH 18.27 (L) 01/02/2023   She was recently started on RX vitamin D 50,000 international units  weekly by her PCP She has struggled with fatigue  Recheck level in 3-4 mos

## 2023-01-08 NOTE — Progress Notes (Signed)
Office: [phone]  /  Fax: [phone]  WEIGHT SUMMARY AND BIOMETRICS  Starting Date: 04/09/22  Starting Weight: 243lb   Weight Lost Since Last Visit: 0lb   Vitals Temp: 97.6 F (36.4 C) BP: -- (PT wears LVAD unable to get BP) Pulse Rate: 66 SpO2: 99 %   Body Composition  Body Fat %: 47.5 % Fat Mass (lbs): 121.4 lbs Muscle Mass (lbs): 127.2 lbs Total Body Water (lbs): 87.4 lbs Visceral Fat Rating : 16     HPI  Chief Complaint: OBESITY  [name] is here to discuss her progress with her obesity treatment plan. She is on the the Category 2 Plan and states she is following her eating plan approximately 90 % of the time. She states she is exercising 30 minutes 3 times per week.   Interval History:  Since last office visit she is up 1 lb She started Wegovy 0.25 mg weekly on Saturday by her PCP with samples She reports that her insurance finally approved it with cardiac indications She is getting in lean protein and fruits and veggies She is still trying to get her thyroid regulated - energy level is improving She is walking on a treadmill at home for about 20 min 3 x a week (in garage-- hot) She is trying to hydrate well and denies sugar cravings She has a net weight gain of 12 lb in the past 8 mos  Pharmacotherapy: Wegovy 0.25 mg  PHYSICAL EXAM:  Pulse 66, temperature 97.6 F (36.4 C), height 5\' 3"  (1.6 m), weight 255 lb (115.7 kg), last menstrual period 03/28/2021, SpO2 99%. Body mass index is 45.17 kg/m.  General: She is overweight, cooperative, alert, well developed, and in no acute distress. PSYCH: Has normal mood, affect and thought process.   Lungs: Normal breathing effort, no conversational dyspnea. Has LVAD  ASSESSMENT AND PLAN  TREATMENT PLAN FOR OBESITY:  Recommended Dietary Goals  Corby is currently in the action stage of change. As such, her goal is to continue weight management plan. She has agreed to following a lower carbohydrate,  vegetable and lean protein rich diet plan. KEEP PROTEIN INTAKE 85-90 G/ DAY  Behavioral Intervention  We discussed the following Behavioral Modification Strategies today: increasing lean protein intake, decreasing simple carbohydrates , increasing vegetables, increasing lower glycemic fruits, increasing fiber rich foods, increasing water intake, keeping healthy foods at home, avoiding temptations and identifying enticing environmental cues, continue to practice mindfulness when eating, and planning for success.  Additional resources provided today: NA  Recommended Physical Activity Goals  Luisana has been advised to work up to 150 minutes of moderate intensity aerobic activity a week and strengthening exercises 2-3 times per week for cardiovascular health, weight loss maintenance and preservation of muscle mass.   She has agreed to [employer] strengthening exercises with a goal of 2-3 sessions a week  and Increase the intensity, frequency or duration of aerobic exercises   EXERCISE AS TOLERATED WITH LVAD, ADDING IN RESISTANCE BANDS, SHORTER WALKS ON TREADMILL WITHOUT EXTREME HEAT  Pharmacotherapy changes for the treatment of obesity: STARTED WEGOVY 0.25 MG WEEKLY WITH PCP THIS WEEK-- REVIEWED PEN INJECTION TECHNIQUE  ASSOCIATED CONDITIONS ADDRESSED TODAY  Stage 3a chronic kidney disease (HCC) Assessment & Plan: Stable Reviewed most recent CMP with stable CKD and GFR 47.  She has been working on dietary changes and is not tracking protein intake Recommend 85-90 grams of dietary protein intake and referred to RD to help her as she just started [facility] for cardiac indications  and also limits intake of greens while on Coumadin.  Lab Results  Component Value Date   CREATININE 1.30 (H) 01/02/2023     Orders: -     Amb ref to Medical Nutrition Therapy-MNT  Obesity, Class III, BMI 40-49.9 (morbid obesity) (HCC)  BMI 45.0-49.9, adult (HCC)  Vitamin D deficiency Assessment & Plan: Last  vitamin D Lab Results  Component Value Date   VD25OH 18.27 (L) 01/02/2023   She was recently started on RX vitamin D 50,000 international units  weekly by her PCP She has struggled with fatigue  Recheck level in 3-4 mos    NICM (nonischemic cardiomyopathy) [facility]) Assessment & Plan: Managed by Dr Graciela Husbands, has LVAD and would need BMI reduction to have cardiac transplant in the future.  She remains undecided if that is what she wants to do but has been fairly stable on current medications with LVAD.  She is able to walk 20-25 min on a treadmill without CP or DOE.    She recently qualified for [facility] with cardiac indications.  Improving BMI is expected to improve her cardiac health as well.  Continue to work on a reduced kcal diet focused on fiber and lean protein with meals.  Referred to RD today and continue current meds per cardiology.       She was informed of the importance of frequent follow up visits to maximize her success with intensive lifestyle modifications for her multiple health conditions.   ATTESTASTION STATEMENTS:  Reviewed by clinician on day of visit: allergies, medications, problem list, medical history, surgical history, family history, social history, and previous encounter notes pertinent to obesity diagnosis.   I have personally spent 30 minutes total time today in preparation, patient care, nutritional counseling and documentation for this visit, including the following: review of clinical lab tests; review of medical tests/procedures/services.      Glennis Brink, DO DABFM, DABOM [facility] [address] [place], [place] [address] [phone]

## 2023-01-08 NOTE — Assessment & Plan Note (Signed)
Stable Reviewed most recent CMP with stable CKD and GFR 47.  She has been working on dietary changes and is not tracking protein intake Recommend 85-90 grams of dietary protein intake and referred to RD to help her as she just started [facility] for cardiac indications and also limits intake of greens while on Coumadin.  Lab Results  Component Value Date   CREATININE 1.30 (H) 01/02/2023

## 2023-01-09 ENCOUNTER — Other Ambulatory Visit (HOSPITAL_COMMUNITY): Payer: Self-pay | Admitting: *Deleted

## 2023-01-09 DIAGNOSIS — Z5181 Encounter for therapeutic drug level monitoring: Secondary | ICD-10-CM

## 2023-01-09 DIAGNOSIS — Z7901 Long term (current) use of anticoagulants: Secondary | ICD-10-CM

## 2023-01-09 DIAGNOSIS — Z95811 Presence of heart assist device: Secondary | ICD-10-CM

## 2023-01-12 ENCOUNTER — Ambulatory Visit (HOSPITAL_BASED_OUTPATIENT_CLINIC_OR_DEPARTMENT_OTHER)
Admission: RE | Admit: 2023-01-12 | Discharge: 2023-01-12 | Disposition: A | Discharge status: Home / Self Care | Payer: Medicare Other | Source: Ambulatory Visit | Attending: Cardiology | Admitting: Cardiology

## 2023-01-12 ENCOUNTER — Ambulatory Visit (HOSPITAL_COMMUNITY): Payer: Self-pay | Admitting: Pharmacist

## 2023-01-12 ENCOUNTER — Ambulatory Visit (HOSPITAL_COMMUNITY)
Admission: RE | Admit: 2023-01-12 | Discharge: 2023-01-12 | Disposition: A | Discharge status: Home / Self Care | Payer: Medicare Other | Source: Ambulatory Visit | Attending: Cardiology | Admitting: Cardiology

## 2023-01-12 ENCOUNTER — Encounter (HOSPITAL_COMMUNITY): Payer: Self-pay | Admitting: Cardiology

## 2023-01-12 DIAGNOSIS — Z7901 Long term (current) use of anticoagulants: Secondary | ICD-10-CM | POA: Diagnosis not present

## 2023-01-12 DIAGNOSIS — E785 Hyperlipidemia, unspecified: Secondary | ICD-10-CM | POA: Diagnosis not present

## 2023-01-12 DIAGNOSIS — Z79899 Other long term (current) drug therapy: Secondary | ICD-10-CM | POA: Insufficient documentation

## 2023-01-12 DIAGNOSIS — I428 Other cardiomyopathies: Secondary | ICD-10-CM | POA: Diagnosis not present

## 2023-01-12 DIAGNOSIS — Z7982 Long term (current) use of aspirin: Secondary | ICD-10-CM | POA: Diagnosis not present

## 2023-01-12 DIAGNOSIS — I130 Hypertensive heart and chronic kidney disease with heart failure and stage 1 through stage 4 chronic kidney disease, or unspecified chronic kidney disease: Secondary | ICD-10-CM | POA: Diagnosis not present

## 2023-01-12 DIAGNOSIS — Z95811 Presence of heart assist device: Secondary | ICD-10-CM | POA: Insufficient documentation

## 2023-01-12 DIAGNOSIS — E0590 Thyrotoxicosis, unspecified without thyrotoxic crisis or storm: Secondary | ICD-10-CM | POA: Diagnosis not present

## 2023-01-12 DIAGNOSIS — I4891 Unspecified atrial fibrillation: Secondary | ICD-10-CM | POA: Insufficient documentation

## 2023-01-12 DIAGNOSIS — N183 Chronic kidney disease, stage 3 unspecified: Secondary | ICD-10-CM | POA: Diagnosis not present

## 2023-01-12 DIAGNOSIS — I509 Heart failure, unspecified: Secondary | ICD-10-CM | POA: Diagnosis not present

## 2023-01-12 DIAGNOSIS — Z90722 Acquired absence of ovaries, bilateral: Secondary | ICD-10-CM | POA: Insufficient documentation

## 2023-01-12 DIAGNOSIS — I5022 Chronic systolic (congestive) heart failure: Secondary | ICD-10-CM | POA: Insufficient documentation

## 2023-01-12 DIAGNOSIS — Z8249 Family history of ischemic heart disease and other diseases of the circulatory system: Secondary | ICD-10-CM | POA: Diagnosis not present

## 2023-01-12 DIAGNOSIS — I083 Combined rheumatic disorders of mitral, aortic and tricuspid valves: Secondary | ICD-10-CM | POA: Insufficient documentation

## 2023-01-12 DIAGNOSIS — I5043 Acute on chronic combined systolic (congestive) and diastolic (congestive) heart failure: Secondary | ICD-10-CM

## 2023-01-12 DIAGNOSIS — Z90710 Acquired absence of both cervix and uterus: Secondary | ICD-10-CM | POA: Diagnosis not present

## 2023-01-12 DIAGNOSIS — G4730 Sleep apnea, unspecified: Secondary | ICD-10-CM | POA: Diagnosis not present

## 2023-01-12 DIAGNOSIS — D649 Anemia, unspecified: Secondary | ICD-10-CM | POA: Diagnosis not present

## 2023-01-12 DIAGNOSIS — Z8603 Personal history of neoplasm of uncertain behavior: Secondary | ICD-10-CM | POA: Insufficient documentation

## 2023-01-12 DIAGNOSIS — Z8673 Personal history of transient ischemic attack (TIA), and cerebral infarction without residual deficits: Secondary | ICD-10-CM | POA: Diagnosis not present

## 2023-01-12 DIAGNOSIS — Z95810 Presence of automatic (implantable) cardiac defibrillator: Secondary | ICD-10-CM | POA: Diagnosis not present

## 2023-01-12 DIAGNOSIS — Z5181 Encounter for therapeutic drug level monitoring: Secondary | ICD-10-CM | POA: Diagnosis not present

## 2023-01-12 LAB — BASIC METABOLIC PANEL
Anion gap: 8 (ref 5–15)
BUN: 21 mg/dL — ABNORMAL HIGH (ref 6–20)
CO2: 24 mmol/L (ref 22–32)
Calcium: 9.1 mg/dL (ref 8.9–10.3)
Chloride: 105 mmol/L (ref 98–111)
Creatinine, Ser: 1.35 mg/dL — ABNORMAL HIGH (ref 0.44–1.00)
GFR, Estimated: 48 mL/min — ABNORMAL LOW (ref >60)
Glucose, Bld: 72 mg/dL (ref 70–99)
Potassium: 3.9 mmol/L (ref 3.5–5.1)
Sodium: 137 mmol/L (ref 135–145)

## 2023-01-12 LAB — PROTIME-INR
INR: 2.5 — ABNORMAL HIGH (ref 0.8–1.2)
Prothrombin Time: 27.4 seconds — ABNORMAL HIGH (ref 11.4–15.2)

## 2023-01-12 LAB — ECHOCARDIOGRAM COMPLETE
Est EF: <20
S' Lateral: 6.7 cm

## 2023-01-12 LAB — CBC
HCT: 43.6 % (ref 36.0–46.0)
Hemoglobin: 12.9 g/dL (ref 12.0–15.0)
MCH: 21.8 pg — ABNORMAL LOW (ref 26.0–34.0)
MCHC: 29.6 g/dL — ABNORMAL LOW (ref 30.0–36.0)
MCV: 73.6 fL — ABNORMAL LOW (ref 80.0–100.0)
Platelets: 265 10*3/uL (ref 150–400)
RBC: 5.92 MIL/uL — ABNORMAL HIGH (ref 3.87–5.11)
RDW: 21.4 % — ABNORMAL HIGH (ref 11.5–15.5)
WBC: 6.8 10*3/uL (ref 4.0–10.5)
nRBC: 0 % (ref 0.0–0.2)

## 2023-01-12 LAB — LACTATE DEHYDROGENASE: LDH: 221 U/L — ABNORMAL HIGH (ref 98–192)

## 2023-01-12 LAB — DIGOXIN LEVEL: Digoxin Level: <0.2 ng/mL — ABNORMAL LOW (ref 0.8–2.0)

## 2023-01-12 NOTE — Progress Notes (Addendum)
Patient presented for a 2 month follow up in VAD Clinic today alone. Reports no problems with VAD equipment or concerns with drive line.  Pt denies lightheadedness, dizziness, falls, shortness of breath, and signs of bleeding. Drinking at least 2 L daily.   Pt states she notices she has put on weight. Weight up 6 pounds today. Pt is participating in healthy weight and wellness program and has been able to increase her exercise routine without any limitation. Started on [place] 2 weeks ago. Appetite has decreased since starting [name].   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 Has follow up with Endocrinology 01/28/23.  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 She has not taken Digoxin since 2023.    ECHO completed this morning. Dr Shirlee Latch reviewed and speed increased to 6000 today per ECHO results. Low speed limit 5700. Advised to notify VAD coordinators if she becomes symptomatic with speed change. She verbalized understanding.   Vital Signs:  Doppler Pressure: 100  Automatc BP: 107/71 (83) HR: 73 SR SPO2: UTO   Weight: 261.2 lbs w/o eqt Last weight: 255.4 lb w/o eqt  VAD Indication: Destination Therapy d/t BMI   VAD interrogation & Equipment Management: Speed: 5900 Flow: 5.4 Power: 5.0 w    PI: 3.9   Alarms: none Events: >100  Fixed speed 5900 >> 6000 Low speed limit: 5600 >> 5700   Primary Controller:  Replace back up battery in 15 months. Back up controller:   Replace back up battery in 23 months.    Annual Equipment Maintenance on UBC/PM was performed on 09/08/22.    I reviewed the LVAD parameters from today and compared the results to the patient's prior recorded data. LVAD interrogation was NEGATIVE for significant power changes, NEGATIVE for clinical alarms and STABLE for PI events/speed drops. No programming changes were made and pump is functioning within specified parameters. Pt is  performing daily controller and system monitor self tests along with completing weekly and monthly maintenance for LVAD equipment.   LVAD equipment check completed and is in good working order. Back-up equipment present. Charged back up battery and performed self-test on equipment.    Exit Site Care: CDI. Drive line anchor secure. Pt denies fever or chills. Patient given 8 weekly kits and 2 packs of large tegaderms for at home use.    Significant Events on VAD Support:    Device: Medtronic Therapies: on VF 240 VT 200 VVI: 40 Last check: 11/05/2022   BP & Labs:  Doppler 100 - correlates with modified systolic    Hgb 12.9 - No S/S of bleeding. Specifically denies vaginal bleeding, melena/BRBPR or nosebleeds.    LDH stable at pending with established baseline of 250 - 450. Denies tea-colored urine. No power elevations noted on interrogation.   Plan:  No medication changes Coumadin dosing per [name] Return to VAD clinic in 2 months with 1.5 year Intermacs  Alyce Pagan RN VAD Coordinator  Office: [phone]  24/7 Pager: [phone]    History of Present Illness: RUSHIE BRAZEL is a 52 y.o. 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  She was followed for systolic CHF initially in [place]. Apparently her EF recovered to 35-40%. However, she was admitted to [facility] 09/19/13 for worsening dyspnea and CP. Coronaries were reportedly OK on LHC at that time at [facility]. Echo showed EF of  15-20% with four-chamber enlargement with moderate-severe TR/MR. RHC as below. Rheumatological w/u negative.  CPX in 9/16 showed near-normal functional capacity.  Repeat echo in 6/17 shows that EF remains 20%. Repeat CPX in 1/18 was submaximal but probably only mild circulatory limitation.  Echo 8/18 showed EF 25-30%, severe LV dilation.  CPX in 6/19 showed low normal functional capacity.  Echo  in 10/19 showed EF 20-25%, moderate LV dilation.    Zio patch in 2/20 with 11% PVCs and NSVT, amiodarone started. Zio patch in 3/20 with 13.3% PVCs and NSVT.  She saw Dr. Graciela Husbands, he thought that increased PVCs were electromechanical in nature due to worsening heart failure.  Repeat Zio patch on amiodarone in 1/21 showed 6.2% PVCs.   She was admitted in 4/20 with VT in setting of hypokalemia.    CPX in 9/20 showed mild functional limitation, more due to body habitus than heart failure.  Echo in 4/21 showed EF 20% with severe LV dilation, diffuse hypokinesis, normal RV size and systolic function.   In 8/21, she had a shock for VF in the setting of hypokalemia.   Patient was admitted in 10/21 with CVA involving left MCA territory.  This was thought to be cardioembolic but LV thrombus was not visualized and no atrial fibrillation has been seen.  Echo in 10/21 showed EF 20-25%, severe LV dilation (no LV thrombus seen), mildly decreased RV systolic function, mild-moderate MR. She was taken by IR for thrombectomy and stent placement, now on ASA 81 and ticagrelor.   She had recurrent VT in 2/22, quinidine was added by EP.  Spironolactone and Coreg were decreased due to low BP and lightheadedness.    Echo in 4/22 showed EF 25% with severe LV dilation and global hypokinesis, normal RV, mild-moderate MR.   CPX (8/22) showed peak VO2 14.3, VE/VCO2 slope 42, RER 1.09 => mild to moderate HF limitation, elevated VE/VCO2 slope is likely false from end exercise hyperventilation. Restrictive lung physiology from obesity.   Patient was admitted in 3/23 with recurrent CHF exacerbation with low cardiac output.  She was also noted to be hyperthyroidism but amiodarone had to be continued due to VT and poorly tolerated atrial fibrillation.  She required Impella 5.5 placement and eventual HM3 LVAD placement on 09/04/21.  Post-op course complicated by atrial fibrillation, malnutrition, and deconditioning.  She went to CIR  prior to discharge.   Patient developed heavy vaginal bleeding in 7/23, she was seen in the ER and required 2 units PRBCs.  She was admitted in 8/23 and had hysterectomy + BSO, LVAD speed increased to 5900 rpm on ramp echo.  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    The patient was admitted overnight in 12/23 with severe posterior epistaxis requiring nasal pacing.   In 2/24, she had VF with shock.  Amiodarone was restarted.   Echo was done today, EF < 20, mild RV dysfunction with normal size, interventricular septum shifted mildly to right.   She returns for followup of CHF and LVAD.  She has just started on semaglutide for weight loss.  Symptomatically doing well, no dyspnea with usual activities.  No lightheadedness.  Chronically has frequent PI events.  Weight up 6 lbs.  No lightheadedness.    LVAD interrogation: Please see LVAD nursing note above.   Labs: (11/09/13): Dig level 0.5, K 3.6, Creatinine 0.83, pro-BNP 622 (12/19/13): K 3.6, creatinine 0.8 (7/15): K 4.1, creatinine 0.91 (8/15): K 3.9, creatinine 0.91 (  10/15): K 4, creatinine 0.93, proBNP 129 (12/15): K 4, creatinine 0.9, digoxin 0.4 (5/16): K 4, creatinine 0.86 (9/16): K 3.6, creatinine 0.86, digoxin < 0.2 (5/17): K 4.1, creatinine 0.95, digoxin 0.3 (5/18): K 3.4, creatinine 0.95, TSH elevated but free T3 and free T4 normal.  (8/18): K 3.9, creatinine 0.87, BNP 108  (11/18): K 3.5, creatinine 1.04, digoxin 0.4 (1/19): LDL 91, HDL 47 (3/19): digoxin 0.7, K 4, creatinine 0.99 (4/19): TSH normal, LDL 95 (9/19): LDL 99, HDL 45, K 4, creatinine 0.96 (10/19): K 3.6, creatinine 0.85, digoxin 0.5 (2/20): LDL 103 (3/20): K 3.9, creatinine 1.05 (4/20): K 3.9, creatinine 1.23 => 1.15, TSH 8, digoxin 0.3, LFTs normal 6/20): K 4.3, creatinine 1.07, LFTs normal, digoxin 0.5 (7/20): K 3.8, creatinine 1.16 (8/20): digoxin < 0.2, LDL 135 (1/21): K 3.9, creatinine 1.05, LDL 171  (4/21): K 4.2,  creatinine 0.95, LDL 127, TSH normal, LFTs normal (10/21): K 2.8, creatinine 1.21, LDL 130 (11/21): K 4.5, creatinine 1.3 => 1.2, LFTs normal, digoxin 1.3, TSH normal (3/22): K 4.1, creatinine 1.36 (4/22): hgb 10.8 (7/22): Transferrin 8%, digoxin 0.9, hgb 10.1, K 3.9, creatinine 1.42 (8/22): digoxin 0.4 (9/22): K 4.2, creatinine 1.32 (4/23): hgb 11, WBCs 13, K 3.9 => 3.8, creatinine 0.9 => 0.98, albumin 3.2, ALT 45, AST 31, LDH 272 => 244, TSH 0.235, free T4 1.84 (7/23): hgb 8, TSH normal 8/23): K 3.8, creatinine 1.15, hgb 11.4 (9/23): K 3.7, creatinine 1.36 (10/23): K 3.6, creatinine 1.42 (11/23): TSH normal (1/24): K 3.9, creatinine 1.17 (2/24): TSH 4.75, hgb 11.3, K 3.4, creatinine 1.35, LFTs normal (3/24): LFTs normal (5/24): K 4, creatinine 1.4, hgb 12.5, LDH 193 (7/24): K 4, creatinine 1.3, LFTs normal, hgb 12.3, LDL 115  PMH: 1. OSA: Using CPAP.  2. PVCs:  - Zio patch 2/20: 11% PVCs - Zio patch 3/20: 13.3% PVCs - Zio patch 1/21: 6.2% PVCs 3. VT/VF: 8/21, in setting of hypokalemia.  2/22 VT, quinidine added.  - VT 2/24, restarted amiodarone 4. 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  No LV thrombus visualized and atrial fibrillation has not been visualized.  - IR thrombectomy with stent placement in 10/21.  5. Chronic systolic CHF: Nonischemic cardiomyopathy:   - Coronary angiography 2015 at [facility]: Normal coronaries.  - Echo (8/15) with EF 20%, moderate LV dilation, diffuse hypokinesis, normal RV size with mildly decreased systolic function, mild MR.  - Echo (5/16) with EF 20%, spherical LV with diffuse hypokinesis, mild MR.  - Echo (6/17) with EF 20%, diffuse hypokinesis, moderate LV dilation, normal RV, mild to moderate MR.  - RHC (12/17): mean RA 5, PA 56/31, mean PCWP 24, CI 2, PVR 3.7 WU - Echo (8/18) with EF 25-30%, severe LV dilation, severe LAE.  - Echo (2/20) with EF 20-25%, moderate LV dilation, moderate MR.  - Echo (4/21) with EF 20-25%, severe LV dilation, mild-moderate  MR, normal RV, PASP 26 mmHg.  - Echo (10/21) with EF 20-25%, severe LV dilation (no LV thrombus seen), mildly decreased RV systolic function, mild-moderate MR. - CPX in 9/20 showed mild functional limitation, more due to body habitus than heart failure.  - CPX in 8/21: Peak VO2 11.6, VE/VCO2 32, RER 1.2.  Mild HF limitation.  - Echo (4/22): EF 25% with severe LV dilation and global hypokinesis, normal RV, mild-moderate MR.  - CPX (8/22): peak VO2 14.3, VE/VCO2 slope 42, RER 1.09 => mild to moderate HF limitation, elevated VE/VCO2 slope is likely false from end exercise hyperventilation. Restrictive lung physiology  from obesity.  - HM3 LVAD placement 09/04/21.  - Echo (7/24): EF < 20, mild RV dysfunction with normal size, interventricular septum shifted mildly to right. 6. PFTs (1/22): Near normal.  7. Fe deficiency anemia 8. 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  10. Left ovarian mass -  hysterectomy + BSO.  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      Current Outpatient Medications  Medication Sig Dispense Refill   ALPRAZolam (XANAX) 0.25 MG tablet Take 1 tablet (0.25 mg total) by mouth 2 (two) times daily as needed for anxiety. 20 tablet 2   amiodarone (PACERONE) 200 MG tablet Take 1 tablet (200 mg total) by mouth 2 (two) times daily for 10 days, THEN 1 tablet (200 mg total) daily thereafter 90 tablet 3   aspirin EC 81 MG tablet Take 1 tablet (81 mg total) by mouth daily. Swallow whole. 90 tablet 3   dapagliflozin propanediol (FARXIGA) 10 MG TABS tablet Take 1 tablet (10 mg total) by mouth daily before breakfast. 90 tablet 3   estradiol (CLIMARA - DOSED IN MG/24 HR) 0.0375 mg/24hr patch Place 1 patch (0.0375 mg total) onto the skin every 7 (seven) days. 4 patch 11   methimazole (TAPAZOLE) 5 MG tablet Take 1 tablet (5 mg total) by mouth as directed. Methimazole 5 mg, two days a week 48 tablet 1   potassium  chloride SA (KLOR-CON M) 20 MEQ tablet Take 3 tablets (60 mEq total) by mouth daily. 270 tablet 3   quiniDINE gluconate 324 MG CR tablet Take 1 tablet (324 mg total) by mouth 2 (two) times daily. 60 tablet 6   rosuvastatin (CRESTOR) 40 MG tablet Take 1 tablet (40 mg total) by mouth daily. 90 tablet 3   Semaglutide-Weight Management (WEGOVY) 0.25 MG/0.5ML SOAJ Inject 0.25 mg into the skin once a week for 4 weeks then increase to 0.5 mg 2 mL 0   sildenafil (REVATIO) 20 MG tablet Take 1 tablet (20 mg total) by mouth 3 (three) times daily. 90 tablet 6   spironolactone (ALDACTONE) 25 MG tablet Take 1 tablet (25 mg total) by mouth daily. Restart on 02/10/22. 45 tablet 3   torsemide (DEMADEX) 20 MG tablet Take 1 tablet (20 mg total) by mouth every other day. Take 1 tablet (20 mg) by mouth as needed, or as directed by heart failure clinic. 180 tablet 3   Vitamin D, Ergocalciferol, (DRISDOL) 1.25 MG (50000 UNIT) CAPS capsule Take 1 capsule (50,000 Units total) by mouth every 7 (seven) days. 13 capsule 1   warfarin (COUMADIN) 2 MG tablet Take 2 tablets (4 mg total) by mouth daily at 4 PM. Take 4mg  daily  or as directed by LVAD Clinic. 180 tablet 5   Semaglutide-Weight Management (WEGOVY) 0.5 MG/0.5ML SOAJ Inject 0.5 mg into the skin once a week for 4 weeks then increase to 1 mg (Patient not taking: Reported on 01/12/2023) 2 mL 0   Semaglutide-Weight Management (WEGOVY) 1 MG/0.5ML SOAJ Inject 1 mg into the skin once a week. (Patient not taking: Reported on 01/12/2023) 2 mL 0   No current facility-administered medications for this encounter.    Allergies:   Patient has no known allergies.   Social History:  The patient  reports that she has never smoked. She has never used smokeless tobacco. She reports that she does not drink alcohol and does not use drugs.   Family History:  The patient's family history includes Heart  Problems in her mother; Heart disease in her maternal grandmother and mother; High blood  pressure in her father; Hypertension in her father and mother; Obesity in her mother; Stroke in her father.   ROS:  Please see the history of present illness.   All other systems are personally reviewed and negative.   Exam:    MAP 83 General: Well appearing this am. NAD.  HEENT: Normal. Neck: Supple, JVP 7-8 cm. Carotids OK.  Cardiac:  Mechanical heart sounds with LVAD hum present.  Lungs:  CTAB, normal effort.  Abdomen:  NT, ND, no HSM. No bruits or masses. +BS  LVAD exit site: Well-healed and incorporated. Dressing dry and intact. No erythema or drainage. Stabilization device present and accurately applied. Driveline dressing changed daily per sterile technique. Extremities:  Warm and dry. No cyanosis, clubbing, rash, or edema.  Neuro:  Alert & oriented x 3. Cranial nerves grossly intact. Moves all 4 extremities w/o difficulty. Affect pleasant    Recent Labs: 08/08/2022: Magnesium 2.2 11/28/2022: TSH 5.79 01/02/2023: ALT 20 01/12/2023: BUN 21; Creatinine, Ser 1.35; Hemoglobin 12.9; Platelets 265; Potassium 3.9; Sodium 137  Personally reviewed   Wt Readings from Last 3 Encounters:  01/12/23 118.5 kg (261 lb 3.2 oz)  01/08/23 115.7 kg (255 lb)  01/02/23 119.7 kg (264 lb)    ASSESSMENT AND PLAN:   1. Chronic systolic HF: Nonischemic cardiomyopathy.  She has a Medtronic ICD. Cause for cardiomyopathy is uncertain, initially identified peri-partum (after son's birth), so cannot rule out peri-partum CMP.  On and off, she has had frequent PVCs.   RHC in 8/15 showed normal filling pressures and preserved cardiac output. Echo in 4/22 with EF 25%, severe LV dilation.  CPX (8/22) with mild-moderate HF limitation.  Echo in 3/23 with EF < 20% with severe dilation, normal RV size with mildly decreased systolic function, moderate-severe functional MR with dilated IVC. 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 Impella 5.5 placed 08/29/21.  Patient had HM3 LVAD placed 09/04/21 and speed adjusted by ramp echo up to 5800 rpm and again up to 5900 rpm. LVAD interrogation shows stable parameters today, chronic frequent PI events.  NYHA class I-II, not volume overloaded.   - Based on echo appearance, increase speed to 6000 rpm.  - Continue torsemide 20 every other day. BMET today.   - Continue Farxiga 10 mg daily.  - Continue spironolactone 25 mg daily.   - She should continue ASA 81 mg daily with history of CVA.  - Continue sildenafil 20 mg tid  - Warfarin for VAD, goal INR 2-2.5.   2. 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  She had been on Levoxyl at home prior to 3/23 admission, noted to be hyperthyroid in 3/23.  This likely contributes to her CHF. She had recurrent VT and poorly tolerated AF which made it difficult for [name] to stop amiodarone. Initially treated w/ IV Solumedrol  -> transitioned to prednisone and methimazole.  Endocrinology following, prednisone has been titrated off, still on methimazole.  We had to restart her on amiodarone with VF.  - Continue amiodarone 200 daily, check LFTs and TSH and will need regular eye exam.  - She has followup with endocrinology.  3. PVCs/VT: EP consulted given complicated situation with hyperthyroidism and VT. Most recently had VF episode and amiodarone restarted.  - Continue quinidine.  -  Continue amiodarone, follow TSH closely.  4. CVA: CVA in 10/21, had thrombectomy with stent placement.  No LV thrombus noted and no atrial fibrillation noted.  - Would continue ASA 81 daily.   5. Anemia: CBC today.  6. CKD stage 3: Follow creatinine closely.  7. Mitral regurgitation: Moderate-severe functional MR pre-LVAD.  Unlikely to be Mitraclip candidate with severely dilated LV (>7 cm). Mitral regurgitation was mild on 7/24 echo.  8. Left ovarian mass: Hysterectomy + BSO.   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   9. Atrial fibrillation: Tolerates poorly.  NSR today.  10. Epistaxis: No recurrence.   Followup in 2 months   Marca Ancona, MD  01/12/2023  [facility] [address] [facility] [place] [address] [phone] (office) [phone] (fax)

## 2023-01-12 NOTE — Patient Instructions (Addendum)
No medication changes Coumadin dosing per [name] Return to VAD clinic in 2 months with 1.5 year Intermacs

## 2023-01-20 ENCOUNTER — Other Ambulatory Visit: Payer: Self-pay

## 2023-01-21 ENCOUNTER — Other Ambulatory Visit (HOSPITAL_COMMUNITY): Payer: Self-pay

## 2023-01-22 ENCOUNTER — Ambulatory Visit (HOSPITAL_COMMUNITY): Payer: Self-pay | Admitting: Pharmacist

## 2023-01-22 ENCOUNTER — Other Ambulatory Visit: Payer: Self-pay

## 2023-01-22 DIAGNOSIS — Z7901 Long term (current) use of anticoagulants: Secondary | ICD-10-CM | POA: Diagnosis not present

## 2023-01-22 LAB — POCT INR: INR: 2.6 (ref 2.0–3.0)

## 2023-01-22 MED ORDER — WARFARIN SODIUM 2 MG PO TABS
ORAL_TABLET | ORAL | 3 refills | Status: DC
  Filled 2023-01-22: qty 180, 90d supply, fill #0
  Filled 2023-03-09 – 2023-04-24 (×2): qty 180, 90d supply, fill #1
  Filled 2023-07-03: qty 180, 90d supply, fill #2
  Filled 2023-09-25: qty 180, 90d supply, fill #3

## 2023-01-23 ENCOUNTER — Other Ambulatory Visit: Payer: Self-pay

## 2023-01-26 ENCOUNTER — Other Ambulatory Visit: Payer: Self-pay

## 2023-01-27 ENCOUNTER — Other Ambulatory Visit: Payer: Self-pay

## 2023-01-28 ENCOUNTER — Telehealth: Payer: Self-pay | Admitting: Internal Medicine

## 2023-01-28 ENCOUNTER — Other Ambulatory Visit: Payer: Self-pay

## 2023-01-28 ENCOUNTER — Other Ambulatory Visit (INDEPENDENT_AMBULATORY_CARE_PROVIDER_SITE_OTHER): Payer: Medicare Other

## 2023-01-28 DIAGNOSIS — E064 Drug-induced thyroiditis: Secondary | ICD-10-CM

## 2023-01-28 DIAGNOSIS — T462X5A Adverse effect of other antidysrhythmic drugs, initial encounter: Secondary | ICD-10-CM

## 2023-01-28 LAB — TSH: TSH: 7.98 u[IU]/mL — ABNORMAL HIGH (ref 0.35–5.50)

## 2023-01-28 LAB — T4, FREE: Free T4: 0.97 ng/dL (ref 0.60–1.60)

## 2023-01-28 NOTE — Addendum Note (Signed)
Addended by: Scarlette Shorts on: 01/28/2023 02:51 PM   Modules accepted: Orders

## 2023-01-28 NOTE — Telephone Encounter (Signed)
Can you please contact the patient and schedule her for a lab appointment 2 months out?    Thank you

## 2023-01-29 ENCOUNTER — Ambulatory Visit (HOSPITAL_COMMUNITY): Payer: Self-pay | Admitting: Pharmacist

## 2023-01-29 LAB — POCT INR: INR: 3.3 — AB (ref 2.0–3.0)

## 2023-01-30 ENCOUNTER — Other Ambulatory Visit: Payer: Self-pay

## 2023-02-05 ENCOUNTER — Ambulatory Visit (INDEPENDENT_AMBULATORY_CARE_PROVIDER_SITE_OTHER): Payer: Medicare Other

## 2023-02-05 ENCOUNTER — Ambulatory Visit (HOSPITAL_COMMUNITY): Payer: Self-pay | Admitting: Pharmacist

## 2023-02-05 ENCOUNTER — Other Ambulatory Visit: Payer: Self-pay

## 2023-02-05 DIAGNOSIS — I428 Other cardiomyopathies: Secondary | ICD-10-CM | POA: Diagnosis not present

## 2023-02-05 LAB — POCT INR: INR: 2.5 (ref 2.0–3.0)

## 2023-02-06 LAB — CUP PACEART REMOTE DEVICE CHECK
Battery Remaining Longevity: 24 mo
Battery Voltage: 2.94 V
Brady Statistic RV Percent Paced: 0.01 %
Date Time Interrogation Session: 20240815133725
HighPow Impedance: 72 Ohm
Implantable Lead Connection Status: 753985
Implantable Lead Implant Date: 20150706
Implantable Lead Location: 753860
Implantable Pulse Generator Implant Date: 20150706
Lead Channel Impedance Value: 342 Ohm
Lead Channel Impedance Value: 456 Ohm
Lead Channel Pacing Threshold Amplitude: 1 V
Lead Channel Pacing Threshold Pulse Width: 0.4 ms
Lead Channel Sensing Intrinsic Amplitude: 4.5 mV
Lead Channel Sensing Intrinsic Amplitude: 4.5 mV
Lead Channel Setting Pacing Amplitude: 2.25 V
Lead Channel Setting Pacing Pulse Width: 0.4 ms
Lead Channel Setting Sensing Sensitivity: 0.3 mV

## 2023-02-09 ENCOUNTER — Encounter (INDEPENDENT_AMBULATORY_CARE_PROVIDER_SITE_OTHER): Payer: Self-pay | Admitting: Internal Medicine

## 2023-02-09 ENCOUNTER — Ambulatory Visit (INDEPENDENT_AMBULATORY_CARE_PROVIDER_SITE_OTHER): Payer: Medicare Other | Admitting: Internal Medicine

## 2023-02-09 VITALS — HR 67 | Temp 97.5°F | Ht 63.0 in | Wt 256.0 lb

## 2023-02-09 DIAGNOSIS — I428 Other cardiomyopathies: Secondary | ICD-10-CM

## 2023-02-09 DIAGNOSIS — N1831 Chronic kidney disease, stage 3a: Secondary | ICD-10-CM | POA: Diagnosis not present

## 2023-02-09 DIAGNOSIS — Z6841 Body Mass Index (BMI) 40.0 and over, adult: Secondary | ICD-10-CM | POA: Diagnosis not present

## 2023-02-09 NOTE — Assessment & Plan Note (Signed)
I reviewed 2 years of labs which showed a decrease in GFR as of last year coinciding with institution of GBMT.  She is also on a fluid restriction.  Her decreased in GFR may be medication related as long as is not more than 30% from baseline he should be susceptible.  She she is on a fluid restriction and is avoiding nephrotoxins.  She will continue to work with heart failure clinic.  Losing 10 to 15% of body weight may improve condition.  Continue with weight loss therapy

## 2023-02-09 NOTE — Progress Notes (Signed)
Office: [phone]  /  Fax: [phone]  WEIGHT SUMMARY AND BIOMETRICS  Vitals Temp: (!) 97.5 F (36.4 C) BP: -- (Patient has LVAD unable to get BP) Pulse Rate: 67 SpO2: 98 %   Anthropometric Measurements Height: 5\' 3"  (1.6 m) Weight: 256 lb (116.1 kg) BMI (Calculated): 45.36 Weight at Last Visit: 255 lb Weight Lost Since Last Visit: 0 lb Weight Gained Since Last Visit: 1 lb Starting Weight: 243 lb Total Weight Loss (lbs): 0 lb (0 kg) Peak Weight: 265 lb   Body Composition  Body Fat %: 48.3 % Fat Mass (lbs): 124 lbs Muscle Mass (lbs): 126.2 lbs Total Body Water (lbs): 89 lbs Visceral Fat Rating : 16    No data recorded Today's Visit #: 11  Starting Date: 04/09/22   HPI  Chief Complaint: OBESITY  [name] is here to discuss her progress with her obesity treatment plan. She is on the the Category 2 Plan and states she is following her eating plan approximately 80 % of the time. She states she is exercising 30 minutes 3-4 times per week.  Interval History:  This is my first encounter with 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  Since last office visit she has gained 1 lb. She is started on Wegovy by PCP and is currently on 0.5 mg once a week. She reports good adherence to reduced calorie nutritional plan. She has been working on not skipping meals, increasing protein intake at every meal, drinking more water, avoiding and / or reducing liquid calories, making healthier choices, and begun to exercise    Orixegenic Control: Denies problems with appetite and hunger signals.  Denies problems with satiety and satiation.  Denies problems with eating patterns and portion control.  Denies abnormal cravings. Denies feeling deprived or restricted.   Barriers identified: medical comorbidities.   Pharmacotherapy for weight loss: She is currently taking Wegovy with adequate clinical  response  and without side effects..    ASSESSMENT AND PLAN  TREATMENT PLAN FOR OBESITY:  Recommended Dietary Goals  Southern is currently in the action stage of change. As such, her goal is to continue weight management plan. She has agreed to: continue current plan and with modified protein intake of 70 to 80 g/day because of chronic kidney disease.  Behavioral Intervention  We discussed the following Behavioral Modification Strategies today: increasing lean protein intake, decreasing simple carbohydrates , increasing vegetables, increasing lower glycemic fruits, increasing water intake, continue to practice mindfulness when eating, and planning for success.  Additional resources provided today: None  Recommended Physical Activity Goals  [name] has been advised to work up to 150 minutes of moderate intensity aerobic activity a week and strengthening exercises 2-3 times per week for cardiovascular health, weight loss maintenance and preservation of muscle mass.   She has agreed to :  Think about ways to increase daily physical activity and overcoming barriers to exercise and Increase physical activity in their day and reduce sedentary time (increase NEAT).  Pharmacotherapy We discussed various medication options to help [name] with her weight loss efforts and we both agreed to : continue current anti-obesity medication regimen as per PCP.  I do not recommend increasing medication at present time.  ASSOCIATED CONDITIONS ADDRESSED TODAY  Stage 3a chronic kidney disease (HCC) Assessment & Plan: I reviewed 2 years of labs which showed a decrease in GFR as of last year coinciding with institution of  GBMT.  She is also on a fluid restriction.  Her decreased in GFR may be medication related as long as is not more than 30% from baseline he should be susceptible.  She she is on a fluid restriction and is avoiding nephrotoxins.  She will continue to work with heart failure clinic.  Losing 10 to 15%  of body weight may improve condition.  Continue with weight loss therapy   Obesity, Class III, BMI 40-49.9 (morbid obesity) (HCC) Assessment & Plan: Her indirect calorimetry is 1858 which is close to calculated.  She is at risk for diet resistant obesity.  Her 24-hour recall suggest that she is staying under 1300 cal but is having a hard time losing weight.  She has good orexigenic control on current dose of incretin therapy.  I recommend that she stay at the current dose.  She has noticed a decrease in appetite and improved satiety on the medication.  She reports having a history of sleep apnea but had a repeat sleep study that did not show sleep disordered breathing, I could not find these test results.  Will have to review records further.  She needs to reach a lower body weight to get on the transplant list.  She has been referred to dietitian by my colleague appointment is still pending.   NICM (nonischemic cardiomyopathy) (HCC) Assessment & Plan: Patient is on LVAD and GBMT.  She has had a decreased GFR since starting treatment so I suspect medication related.  She is also on a fluid restriction and may have chronic prerenal AKI as a cause of decreased GFR.  Most recent electrolytes are within normal limits.  She needs to lose weight to qualify for heart transplantation.  At present time there is no signs of decompensated heart failure or overload.     PHYSICAL EXAM:  Pulse 67, temperature (!) 97.5 F (36.4 C), height 5\' 3"  (1.6 m), weight 256 lb (116.1 kg), last menstrual period 03/28/2021, SpO2 98%. Body mass index is 45.35 kg/m.  General: She is overweight, cooperative, alert, well developed, and in no acute distress. PSYCH: Has normal mood, affect and thought process.   HEENT: EOMI, sclerae are anicteric. Lungs: Normal breathing effort, no conversational dyspnea. Extremities: No edema.  Neurologic: No gross sensory or motor deficits. No tremors or fasciculations noted.     DIAGNOSTIC DATA REVIEWED:  BMET    Component Value Date/Time   NA 137 01/12/2023 0839   NA 138 02/08/2020 0948   NA 138 09/24/2013 0536   K 3.9 01/12/2023 0839   K 3.7 09/24/2013 0536   CL 105 01/12/2023 0839   CL 107 09/24/2013 0536   CO2 24 01/12/2023 0839   CO2 25 09/24/2013 0536   GLUCOSE 72 01/12/2023 0839   GLUCOSE 82 09/24/2013 0536   BUN 21 (H) 01/12/2023 0839   BUN 18 02/08/2020 0948   BUN 11 09/24/2013 0536   CREATININE 1.35 (H) 01/12/2023 0839   CREATININE 1.08 09/24/2013 0536   CALCIUM 9.1 01/12/2023 0839   CALCIUM 8.6 09/24/2013 0536   GFRNONAA 48 (L) 01/12/2023 0839   GFRNONAA >60 09/24/2013 0536   GFRAA >60 02/23/2020 0903   GFRAA >60 09/24/2013 0536   Lab Results  Component Value Date   HGBA1C 5.5 01/02/2023   HGBA1C 5.9 09/19/2013   Lab Results  Component Value Date   INSULIN 17.8 04/09/2022   INSULIN 22.5 10/05/2017   Lab Results  Component Value Date   TSH 7.98 (H) 01/28/2023   CBC  Component Value Date/Time   WBC 6.8 01/12/2023 0839   RBC 5.92 (H) 01/12/2023 0839   HGB 12.9 01/12/2023 0839   HGB 10.1 (L) 12/25/2020 1132   HCT 43.6 01/12/2023 0839   HCT 33.8 (L) 12/25/2020 1132   PLT 265 01/12/2023 0839   PLT 430 12/25/2020 1132   MCV 73.6 (L) 01/12/2023 0839   MCV 73 (L) 12/25/2020 1132   MCV 85 09/23/2013 0443   MCH 21.8 (L) 01/12/2023 0839   MCHC 29.6 (L) 01/12/2023 0839   RDW 21.4 (H) 01/12/2023 0839   RDW 17.0 (H) 12/25/2020 1132   RDW 15.4 (H) 09/23/2013 0443   Iron Studies    Component Value Date/Time   IRON 18 (L) 12/03/2021 1021   TIBC 351 12/03/2021 1021   FERRITIN 17 12/03/2021 1021   IRONPCTSAT 5 (L) 12/03/2021 1021   Lipid Panel     Component Value Date/Time   CHOL 204 (H) 01/02/2023 0926   CHOL 171 04/09/2022 1147   CHOL 183 09/20/2013 0424   TRIG 77.0 01/02/2023 0926   TRIG 105 09/20/2013 0424   HDL 73.30 01/02/2023 0926   HDL 62 04/09/2022 1147   HDL 40 09/20/2013 0424   CHOLHDL 3 01/02/2023 0926    VLDL 15.4 01/02/2023 0926   VLDL 21 09/20/2013 0424   LDLCALC 115 (H) 01/02/2023 0926   LDLCALC 94 04/09/2022 1147   LDLCALC 122 (H) 09/20/2013 0424   Hepatic Function Panel     Component Value Date/Time   PROT 7.6 01/02/2023 0926   PROT 7.8 03/10/2018 1507   PROT 7.7 09/19/2013 0431   ALBUMIN 4.2 01/02/2023 0926   ALBUMIN 4.5 03/10/2018 1507   ALBUMIN 3.5 09/19/2013 0431   AST 23 01/02/2023 0926   AST 36 09/19/2013 0431   ALT 20 01/02/2023 0926   ALT 32 09/19/2013 0431   ALKPHOS 88 01/02/2023 0926   ALKPHOS 65 09/19/2013 0431   BILITOT 0.3 01/02/2023 0926   BILITOT 0.4 03/10/2018 1507   BILITOT 0.4 09/19/2013 0431   BILIDIR 0.2 09/01/2021 0331   IBILI 0.8 09/01/2021 0331      Component Value Date/Time   TSH 7.98 (H) 01/28/2023 0846   Nutritional Lab Results  Component Value Date   VD25OH 18.27 (L) 01/02/2023   VD25OH 19.1 (L) 04/09/2022   VD25OH 30.5 12/01/2019     Return in about 2 weeks (around 02/23/2023) for For Weight Mangement with Dr. Rikki Spearing 40 minutes, complex obesity.. She was informed of the importance of frequent follow up visits to maximize her success with intensive lifestyle modifications for her multiple health conditions.   ATTESTASTION STATEMENTS:  Reviewed by clinician on day of visit: allergies, medications, problem list, medical history, surgical history, family history, social history, and previous encounter notes.     Worthy Rancher, MD

## 2023-02-09 NOTE — Assessment & Plan Note (Signed)
Her indirect calorimetry is 1858 which is close to calculated.  She is at risk for diet resistant obesity.  Her 24-hour recall suggest that she is staying under 1300 cal but is having a hard time losing weight.  She has good orexigenic control on current dose of incretin therapy.  I recommend that she stay at the current dose.  She has noticed a decrease in appetite and improved satiety on the medication.  She reports having a history of sleep apnea but had a repeat sleep study that did not show sleep disordered breathing, I could not find these test results.  Will have to review records further.  She needs to reach a lower body weight to get on the transplant list.  She has been referred to dietitian by my colleague appointment is still pending.

## 2023-02-09 NOTE — Assessment & Plan Note (Signed)
Patient is on LVAD and GBMT.  She has had a decreased GFR since starting treatment so I suspect medication related.  She is also on a fluid restriction and may have chronic prerenal AKI as a cause of decreased GFR.  Most recent electrolytes are within normal limits.  She needs to lose weight to qualify for heart transplantation.  At present time there is no signs of decompensated heart failure or overload.

## 2023-02-12 ENCOUNTER — Ambulatory Visit (HOSPITAL_COMMUNITY): Payer: Self-pay | Admitting: Pharmacist

## 2023-02-12 ENCOUNTER — Other Ambulatory Visit: Payer: Self-pay

## 2023-02-12 DIAGNOSIS — Z01411 Encounter for gynecological examination (general) (routine) with abnormal findings: Secondary | ICD-10-CM | POA: Diagnosis not present

## 2023-02-12 DIAGNOSIS — Z1231 Encounter for screening mammogram for malignant neoplasm of breast: Secondary | ICD-10-CM | POA: Diagnosis not present

## 2023-02-12 DIAGNOSIS — B3731 Acute candidiasis of vulva and vagina: Secondary | ICD-10-CM | POA: Diagnosis not present

## 2023-02-12 LAB — HM MAMMOGRAPHY

## 2023-02-12 LAB — POCT INR: INR: 2.3 (ref 2.0–3.0)

## 2023-02-12 MED ORDER — FLUCONAZOLE 150 MG PO TABS
150.0000 mg | ORAL_TABLET | Freq: Every day | ORAL | 0 refills | Status: DC
  Filled 2023-02-12: qty 1, 1d supply, fill #0

## 2023-02-17 NOTE — Progress Notes (Signed)
Remote ICD transmission.   

## 2023-02-19 ENCOUNTER — Ambulatory Visit (HOSPITAL_COMMUNITY): Payer: Self-pay | Admitting: Pharmacist

## 2023-02-19 DIAGNOSIS — Z7901 Long term (current) use of anticoagulants: Secondary | ICD-10-CM | POA: Diagnosis not present

## 2023-02-19 LAB — POCT INR: INR: 3.3 — AB (ref 2.0–3.0)

## 2023-02-26 ENCOUNTER — Ambulatory Visit (HOSPITAL_COMMUNITY): Payer: Self-pay | Admitting: Pharmacist

## 2023-02-26 LAB — POCT INR: INR: 3 (ref 2.0–3.0)

## 2023-03-05 ENCOUNTER — Ambulatory Visit (HOSPITAL_COMMUNITY): Payer: Self-pay | Admitting: Pharmacist

## 2023-03-05 LAB — POCT INR: INR: 2.5 (ref 2.0–3.0)

## 2023-03-06 ENCOUNTER — Telehealth (HOSPITAL_COMMUNITY): Payer: Self-pay | Admitting: Licensed Clinical Social Worker

## 2023-03-06 ENCOUNTER — Encounter: Payer: Self-pay | Admitting: Gynecologic Oncology

## 2023-03-06 NOTE — Telephone Encounter (Signed)
CSW contacted patient to remind of upcoming VADiversary next Thursday March 12, 2023 at 5pm. [name], [name], CCSW-MCS [phone]

## 2023-03-09 ENCOUNTER — Other Ambulatory Visit: Payer: Self-pay | Admitting: Gynecologic Oncology

## 2023-03-09 ENCOUNTER — Other Ambulatory Visit: Payer: Self-pay

## 2023-03-09 DIAGNOSIS — D3910 Neoplasm of uncertain behavior of unspecified ovary: Secondary | ICD-10-CM

## 2023-03-10 ENCOUNTER — Other Ambulatory Visit: Payer: Self-pay

## 2023-03-10 ENCOUNTER — Inpatient Hospital Stay: Payer: Medicare Other

## 2023-03-10 ENCOUNTER — Inpatient Hospital Stay: Payer: Medicare Other | Attending: Gynecologic Oncology | Admitting: Gynecologic Oncology

## 2023-03-10 ENCOUNTER — Telehealth: Payer: Self-pay

## 2023-03-10 ENCOUNTER — Other Ambulatory Visit: Payer: Self-pay | Admitting: Gynecologic Oncology

## 2023-03-10 VITALS — BP 108/93 | HR 70 | Temp 98.3°F | Resp 16 | Ht 63.0 in | Wt 260.0 lb

## 2023-03-10 DIAGNOSIS — Z86018 Personal history of other benign neoplasm: Secondary | ICD-10-CM | POA: Insufficient documentation

## 2023-03-10 DIAGNOSIS — Z90710 Acquired absence of both cervix and uterus: Secondary | ICD-10-CM | POA: Diagnosis not present

## 2023-03-10 DIAGNOSIS — R350 Frequency of micturition: Secondary | ICD-10-CM | POA: Insufficient documentation

## 2023-03-10 DIAGNOSIS — R3129 Other microscopic hematuria: Secondary | ICD-10-CM

## 2023-03-10 DIAGNOSIS — D3910 Neoplasm of uncertain behavior of unspecified ovary: Secondary | ICD-10-CM

## 2023-03-10 DIAGNOSIS — N951 Menopausal and female climacteric states: Secondary | ICD-10-CM | POA: Insufficient documentation

## 2023-03-10 DIAGNOSIS — Z79890 Hormone replacement therapy (postmenopausal): Secondary | ICD-10-CM | POA: Insufficient documentation

## 2023-03-10 DIAGNOSIS — Z90722 Acquired absence of ovaries, bilateral: Secondary | ICD-10-CM | POA: Insufficient documentation

## 2023-03-10 DIAGNOSIS — B3731 Acute candidiasis of vulva and vagina: Secondary | ICD-10-CM | POA: Diagnosis not present

## 2023-03-10 LAB — URINALYSIS, COMPLETE (UACMP) WITH MICROSCOPIC
Bacteria, UA: NONE SEEN
Bilirubin Urine: NEGATIVE
Glucose, UA: NEGATIVE mg/dL
Ketones, ur: NEGATIVE mg/dL
Leukocytes,Ua: NEGATIVE
Nitrite: NEGATIVE
Protein, ur: NEGATIVE mg/dL
Specific Gravity, Urine: 1.004 — ABNORMAL LOW (ref 1.005–1.030)
pH: 6 (ref 5.0–8.0)

## 2023-03-10 MED ORDER — MICONAZOLE NITRATE 2 % EX CREA
1.0000 | TOPICAL_CREAM | Freq: Every day | CUTANEOUS | 0 refills | Status: DC
Start: 2023-03-10 — End: 2023-04-22
  Filled 2023-03-10: qty 30, 30d supply, fill #0

## 2023-03-10 MED ORDER — FLUCONAZOLE 100 MG PO TABS
100.0000 mg | ORAL_TABLET | Freq: Once | ORAL | 0 refills | Status: AC
Start: 2023-03-10 — End: 2023-03-12
  Filled 2023-03-10: qty 1, 1d supply, fill #0

## 2023-03-10 NOTE — Telephone Encounter (Signed)
Pt is aware of urinalysis results.  Appointment for follow up urinalysis collection scheduled for 03/24/23 @ 9:00.

## 2023-03-10 NOTE — Patient Instructions (Addendum)
We will let you know the results of your urine sample from today along with your inhibin level when this returns.  I sent in a topical yeast cream you can use on the vulva in between the labia once daily for 5-7 days or for symptoms. I also sent in another tablet of the diflucan yeast treatment tablet. This medication flags with your coumadin but I spoke with the pharmacist and she said a one time dose is ok. SHE SAID YOU WOULD NEED TO LET THE COUMADIN CLINIC KNOW YOU TOOK THE DOSE OF DIFLUCAN.   Plan to follow up with Dr. Pricilla Holm in six months or sooner.

## 2023-03-10 NOTE — Telephone Encounter (Signed)
-----   Message from Doylene Bode sent at 03/10/2023  4:06 PM EDT ----- Please let her know her urine sample does not have any obvious signs of infection. The increased freq could be related to the yeast on the vulva and near the urethra so will be interesting to see if she has improvement after treating the yeast.   There was a small amount of blood in her urine. We can plan on rechecking another sample in 1.5-2 weeks. The blood could have been from the exam performed today but since she is on Coumadin etc would recommend rechecking another sample as follow up.

## 2023-03-11 ENCOUNTER — Other Ambulatory Visit: Payer: Self-pay

## 2023-03-12 ENCOUNTER — Ambulatory Visit (HOSPITAL_COMMUNITY): Payer: Self-pay | Admitting: Pharmacist

## 2023-03-12 LAB — POCT INR: INR: 2.8 (ref 2.0–3.0)

## 2023-03-13 NOTE — Progress Notes (Signed)
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  Interval History: Patient reports overall doing well since her last visit. Hot flashes have improved with the estrogen patch. She started on Mounjaro around 2 months ago and has not seen a change in her weight. She reports desired side effects with Mounjaro such as decreased appetite. She feels her thyroid may be the cause of her inability to lose weight. She has had intermittent constipation with this as well. Reporting intermittent abdominal bloating that does not persist. She was recently treated in August for a yeast infection by her Gyn with diflucan. She states she did not have symptoms of this but it was noted on pelvic examination. She has also experienced an increase in urinary frequency without hematuria or dysuria. Denies any vaginal bleeding or discharge.  Denies any abdominal or pelvic pain.   Past Medical/Surgical History: Past Medical History:  Diagnosis Date   Anemia    Automatic implantable cardioverter-defibrillator in situ    B12 deficiency    Cancer (HCC)    Chronic CHF (congestive heart failure) (HCC)    a. EF 15-20% b. RHC (09/2013) RA 14, RV 57/22, PA 64/36 (48), PCWP 18, FIck CO/CI 3.7/1.6, PVR 8.1 WU, PA sat 47%    Heart valve problem    History of heart attack    History of hypothyroidism    History of stomach ulcers    HLD (hyperlipidemia)    Hyperthyroidism    Hypotension    Hypothyroidism    Morbid obesity (HCC)    Myocardial infarction (HCC) 08/2013   Nocturnal dyspnea    Nonischemic cardiomyopathy (HCC)    Palpitations    Sinus tachycardia    Sleep apnea    Snoring-prob OSA 09/04/2011   SOB (shortness of breath)    Sprint Fidelis ICD lead RECALL   6949    Stroke (HCC) 2021   some residual right-sided weakness   UARS (upper airway resistance syndrome) 09/04/2011   HST 12/2013:  AHI 4/hr (numerous episodes of airflow reduction that did not have concomitant desaturation)    Vitamin D deficiency     Past Surgical History:  Procedure Laterality Date   BREATH TEK H PYLORI N/A 11/09/2014   Procedure: BREATH TEK H PYLORI;  Surgeon: Gaynelle Adu, MD;  Location: [facility];  Service: General;  Laterality: N/A;   CARDIAC CATHETERIZATION  ~ 2006; 09/2013   CARDIAC CATHETERIZATION N/A 05/23/2016   Procedure: Right Heart Cath;  Surgeon: Laurey Morale, MD;  Location: [facility] INVASIVE CV LAB;  Service: Cardiovascular;  Laterality: N/A;   CARDIAC DEFIBRILLATOR PLACEMENT  2006; 12/26/2013   Medtronic [id number]; 12-2013 ICD gen change and RV lead revision with new 6935 RV lead by Dr Graciela Husbands   CENTRAL LINE INSERTION  08/28/2021   Procedure: CENTRAL LINE INSERTION;  Surgeon: Laurey Morale, MD;  Location: [facility] INVASIVE CV LAB;  Service: Cardiovascular;;   CESAREAN SECTION  06/23/1997   IMPLANTABLE CARDIOVERTER DEFIBRILLATOR GENERATOR CHANGE N/A 12/26/2013   Procedure: IMPLANTABLE CARDIOVERTER DEFIBRILLATOR GENERATOR CHANGE;  Surgeon: Duke Salvia, MD;  Location: [facility] CATH LAB;  Service: Cardiovascular;  Laterality: N/A;   INSERTION OF IMPLANTABLE LEFT VENTRICULAR ASSIST DEVICE N/A 09/03/2021   Procedure: INSERTION OF IMPLANTABLE LEFT VENTRICULAR ASSIST DEVICE/ Heartmate 3;  Surgeon: Lovett Sox, MD;  Location: [facility] OR;  Service: Open Heart Surgery;  Laterality: N/A;   IR CT HEAD LTD  04/17/2020   IR CT HEAD LTD  04/17/2020   IR INTRA CRAN STENT  04/17/2020   IR PERCUTANEOUS ART THROMBECTOMY/INFUSION INTRACRANIAL INC DIAG ANGIO  04/17/2020   IR RADIOLOGIST EVAL & MGMT  06/05/2020   LEAD REVISION N/A 12/26/2013   Procedure: LEAD REVISION;  Surgeon: Duke Salvia, MD;  Location: [facility] CATH LAB;  Service: Cardiovascular;  Laterality: N/A;   LEFT  VENTRICULAR ASSIST DEVICE     PLACEMENT OF IMPELLA LEFT VENTRICULAR ASSIST DEVICE Right 08/29/2021   Procedure: PLACEMENT OF IMPELLA 5.5 LEFT VENTRICULAR ASSIST DEVICE;  Surgeon: Alleen Borne, MD;  Location: [facility] OR;  Service: Open Heart Surgery;  Laterality: Right;   RADIOLOGY WITH ANESTHESIA N/A 04/17/2020   Procedure: Code Stroke;  Surgeon: Julieanne Cotton, MD;  Location: [facility] OR;  Service: Radiology;  Laterality: N/A;   REMOVAL OF IMPELLA LEFT VENTRICULAR ASSIST DEVICE N/A 09/03/2021   Procedure: REMOVAL OF IMPELLA LEFT VENTRICULAR ASSIST DEVICE;  Surgeon: Lovett Sox, MD;  Location: [facility] OR;  Service: Open Heart Surgery;  Laterality: N/A;   RIGHT HEART CATH N/A 08/28/2021   Procedure: RIGHT HEART CATH;  Surgeon: Laurey Morale, MD;  Location: [facility] INVASIVE CV LAB;  Service: Cardiovascular;  Laterality: N/A;   RIGHT HEART CATHETERIZATION N/A 02/15/2014   Procedure: RIGHT HEART CATH;  Surgeon: Laurey Morale, MD;  Location: [facility] CATH LAB;  Service: Cardiovascular;  Laterality: N/A;   ROBOTIC ASSISTED TOTAL HYSTERECTOMY WITH BILATERAL SALPINGO OOPHERECTOMY N/A 02/05/2022   Procedure: XI ROBOTIC ASSISTED TOTAL HYSTERECTOMY WITH BILATERAL SALPINGO OOPHORECTOMY, CYSTOSCOPY, UTERUS GREATER THAN 250 GRAMS;  Surgeon: Carver Fila, MD;  Location: [facility] OR;  Service: Gynecology;  Laterality: N/A;   TEE WITHOUT CARDIOVERSION N/A 08/29/2021   Procedure: TRANSESOPHAGEAL ECHOCARDIOGRAM (TEE);  Surgeon: Alleen Borne, MD;  Location: [facility] OR;  Service: Open Heart Surgery;  Laterality: N/A;   TEE WITHOUT CARDIOVERSION N/A 09/03/2021   Procedure: TRANSESOPHAGEAL ECHOCARDIOGRAM (TEE);  Surgeon: Lovett Sox, MD;  Location: [facility] OR;  Service: Open Heart Surgery;  Laterality: N/A;   TUBAL LIGATION  06/23/1997    Family History  Problem Relation Age of Onset   Hypertension Mother    Heart disease Mother    Obesity Mother    Heart Problems Mother    Hypertension Father    High blood pressure Father     Stroke Father    Heart disease Maternal Grandmother    Colon cancer Neg Hx    Breast cancer Neg Hx    Ovarian cancer Neg Hx    Endometrial cancer Neg Hx    Pancreatic cancer Neg Hx    Prostate cancer Neg Hx    Liver disease Neg Hx    Cancer - Colon Neg Hx     Social History   Socioeconomic History   Marital status: Married    Spouse name: Marypatricia Town   Number of children: 2   Years of education: Not on file   Highest education level: Not on file  Occupational History   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  Tobacco Use   Smoking status: Never   Smokeless tobacco: Never  Vaping Use   Vaping status: Never Used  Substance and Sexual Activity   Alcohol use: No   Drug use: No   Sexual activity: Not Currently  Other Topics Concern   Not on file  Social History Narrative  Soda sometimes   Right handed   Social Determinants of Health   Financial Resource Strain: Low Risk  (10/20/2022)   Overall Financial Resource Strain (CARDIA)    Difficulty of Paying Living Expenses: Not hard at all  Food Insecurity: No Food Insecurity (10/20/2022)   Hunger Vital Sign    Worried About Running Out of Food in the Last Year: Never true    Ran Out of Food in the Last Year: Never true  Transportation Needs: No Transportation Needs (10/20/2022)   PRAPARE - Administrator, Civil Service (Medical): No    Lack of Transportation (Non-Medical): No  Physical Activity: Sufficiently Active (10/20/2022)   Exercise Vital Sign    Days of Exercise per Week: 3 days    Minutes of Exercise per Session: 60 min  Stress: No Stress Concern Present (10/20/2022)   [employer] of Occupational Health - Occupational Stress Questionnaire    Feeling of Stress : Only a little  Social Connections: Socially Integrated (10/20/2022)   Social Connection and Isolation Panel [NHANES]    Frequency of Communication with Friends and Family: More than three times a week    Frequency of Social  Gatherings with Friends and Family: More than three times a week    Attends Religious Services: More than 4 times per year    Active Member of [employer] or Organizations: Yes    Attends Engineer, structural: More than 4 times per year    Marital Status: Married    Current Medications:  Current Outpatient Medications:    ALPRAZolam (XANAX) 0.25 MG tablet, Take 1 tablet (0.25 mg total) by mouth 2 (two) times daily as needed for anxiety., Disp: 20 tablet, Rfl: 2   aspirin EC 81 MG tablet, Take 1 tablet (81 mg total) by mouth daily. Swallow whole., Disp: 90 tablet, Rfl: 3   dapagliflozin propanediol (FARXIGA) 10 MG TABS tablet, Take 1 tablet (10 mg total) by mouth daily before breakfast., Disp: 90 tablet, Rfl: 3   estradiol (CLIMARA - DOSED IN MG/24 HR) 0.0375 mg/24hr patch, Place 1 patch (0.0375 mg total) onto the skin every 7 (seven) days., Disp: 4 patch, Rfl: 11   miconazole (MICATIN) 2 % cream, Apply 1 Application topically daily. Apply topically to skin folds on the vulva to treat yeast, Disp: 30 g, Rfl: 0   potassium chloride SA (KLOR-CON M) 20 MEQ tablet, Take 3 tablets (60 mEq total) by mouth daily., Disp: 270 tablet, Rfl: 3   quiniDINE gluconate 324 MG CR tablet, Take 1 tablet (324 mg total) by mouth 2 (two) times daily., Disp: 60 tablet, Rfl: 6   rosuvastatin (CRESTOR) 40 MG tablet, Take 1 tablet (40 mg total) by mouth daily., Disp: 90 tablet, Rfl: 3   Semaglutide-Weight Management (WEGOVY) 0.25 MG/0.5ML SOAJ, Inject 0.25 mg into the skin once a week for 4 weeks then increase to 0.5 mg, Disp: 2 mL, Rfl: 0   sildenafil (REVATIO) 20 MG tablet, Take 1 tablet (20 mg total) by mouth 3 (three) times daily., Disp: 90 tablet, Rfl: 6   spironolactone (ALDACTONE) 25 MG tablet, Take 1 tablet (25 mg total) by mouth daily. Restart on 02/10/22., Disp: 45 tablet, Rfl: 3   torsemide (DEMADEX) 20 MG tablet, Take 1 tablet (20 mg total) by mouth every other day. Take 1 tablet (20 mg) by mouth as needed,  or as directed by heart failure clinic., Disp: 180 tablet, Rfl: 3   Vitamin D, Ergocalciferol, (DRISDOL) 1.25 MG (50000 UNIT) CAPS  capsule, Take 1 capsule (50,000 Units total) by mouth every 7 (seven) days., Disp: 13 capsule, Rfl: 1   warfarin (COUMADIN) 2 MG tablet, Take 4 mg (2 tabs) daily or as directed by LVAD Clinic., Disp: 180 tablet, Rfl: 3   Semaglutide-Weight Management (WEGOVY) 0.5 MG/0.5ML SOAJ, Inject 0.5 mg into the skin once a week for 4 weeks then increase to 1 mg (Patient not taking: Reported on 03/06/2023), Disp: 2 mL, Rfl: 0   Semaglutide-Weight Management (WEGOVY) 1 MG/0.5ML SOAJ, Inject 1 mg into the skin once a week. (Patient not taking: Reported on 02/09/2023), Disp: 2 mL, Rfl: 0  Review of Systems: + weight gain with no decrease after starting Mounjaro. + constipation Denies unexpected appetite changes, fevers, chills, fatigue. Has had decreased intake with Mounjaro. Denies hearing loss, neck lumps or masses, mouth sores, ringing in ears or voice changes. Denies cough or wheezing.  Denies shortness of breath. Denies chest pain or palpitations. Denies leg swelling. Denies abdominal distention, pain, blood in stools, diarrhea, nausea, vomiting, or early satiety. +constipation Denies pain with intercourse, dysuria, frequency, hematuria or incontinence. Denies pelvic pain, vaginal bleeding or vaginal discharge.   Denies joint pain, back pain or muscle pain/cramps. Denies itching, rash, or wounds. Denies dizziness, headaches, numbness or seizures. Denies swollen lymph nodes or glands, denies easy bruising or bleeding. Denies depression, confusion, or decreased concentration.  Physical Exam: BP (!) 108/93 (BP Location: Right Arm, Patient Position: Sitting)   Pulse 70   Temp 98.3 F (36.8 C) (Oral)   Resp 16   Ht 5\' 3"  (1.6 m)   Wt 260 lb (117.9 kg)   LMP 03/28/2021 (Approximate)   SpO2 100%   BMI 46.06 kg/m  General: Alert, oriented, no acute distress. HEENT:  Normocephalic, atraumatic, sclera anicteric. Chest: Unlabored breathing on room air. Lungs clear. Mechanical heart sounds with LVAD hum.  Abdomen: Obese, soft, nontender.  Normoactive bowel sounds.  No masses or hepatosplenomegaly appreciated.  Well-healed incisions. Extremities: Grossly normal range of motion.  Warm, well perfused.  No edema bilaterally. Skin: No rashes or lesions noted. Lymphatics: No cervical, supraclavicular, or inguinal adenopathy. GU: Normal appearing external genitalia without erythema, excoriation, or lesions. Small amount of candida present between labial folds. Speculum exam reveals cuff intact, no lesions or atypical vascularity.  Bimanual exam reveals no masses or nodularity. White discharge noted in the vagina.  Laboratory & Radiologic Studies: None new  Mammogram in 01/2023 Virtual colonoscopy in 10/2022  Assessment & Plan: ENNIFER TELLIER is a 52 y.o. 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 Patient is overall doing well, NED on exam today. Inhibin B performed today and she will be contacted with the results.  Will plan for an additional dose of diflucan to treat vulvar and vaginal yeast. Also prescribed topical antifungal cream. Urine sample obtained given increase in frequency. Per [facility] pharmacist, patient can proceed with one time dose of diflucan and to instruct patient to notify Coumadin clinic of diflucan dose at next visit. Patient verbalizing understanding.  She will plan to continue with estrogen patch given improvement in hot flashes. We will continue with surveillance visits at this time and an interval of every 6 months with alternation between myself and Dr. Pricilla Holm.  We discussed signs and symptoms that would be concerning for recurrence of granulosa cell tumor and she is advised to call the office for any needs, concerns, or new persistent symptoms.   20 minutes of total time was spent for this  patient encounter, including  preparation, face-to-face counseling with the patient and coordination of care, and documentation of the encounter.  Warner Mccreedy NP [facility] GYN Oncology

## 2023-03-15 IMAGING — US US PELVIS COMPLETE WITH TRANSVAGINAL
1 series · 13 of 25 positions shown · non-contrast
Comparison: 08/27/2021

CLINICAL DATA: Complex adnexal mass, follow-up; LMP approximately 2
weeks ago, specific date not known

EXAM:
TRANSABDOMINAL AND TRANSVAGINAL ULTRASOUND OF PELVIS
TECHNIQUE: Both transabdominal and transvaginal ultrasound examinations of the
pelvis were performed. Transabdominal technique was performed for
global imaging of the pelvis including uterus, ovaries, adnexal
regions, and pelvic cul-de-sac. It was necessary to proceed with
endovaginal exam following the transabdominal exam to visualize the
ovaries and adnexa.

[Series 1: us pelvis complete with transvaginal · 0.22mm/px · 13 of 124 slices shown]
[im 1/124]
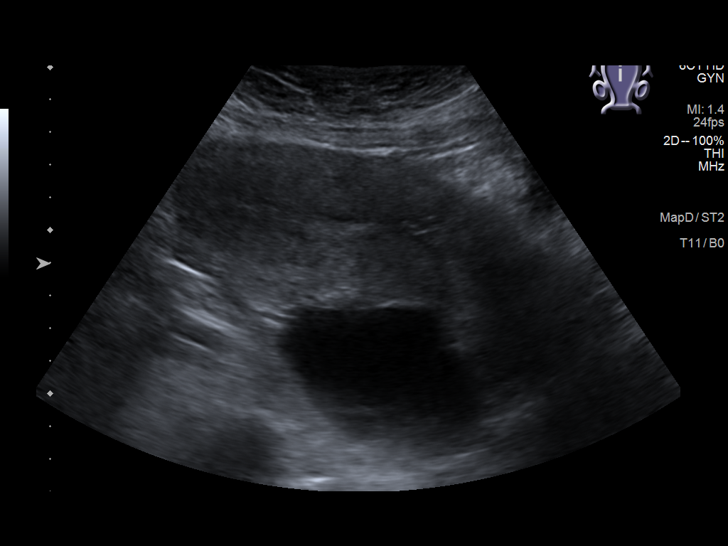
[im 11/124]
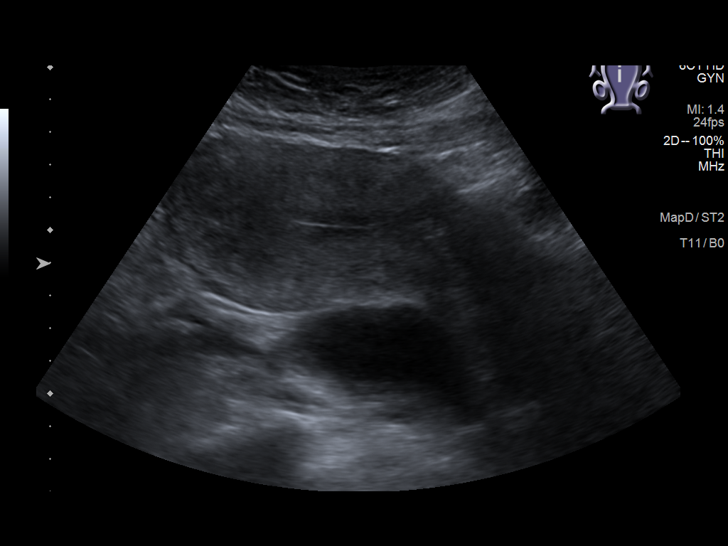
[im 21/124]
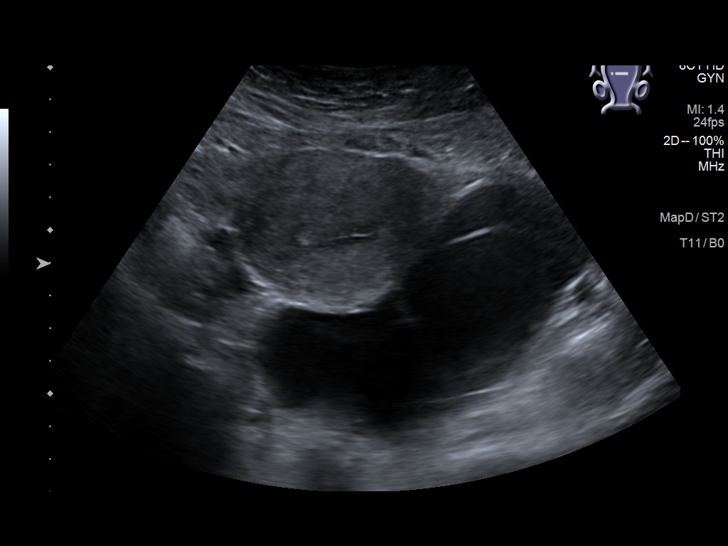
[im 31/124]
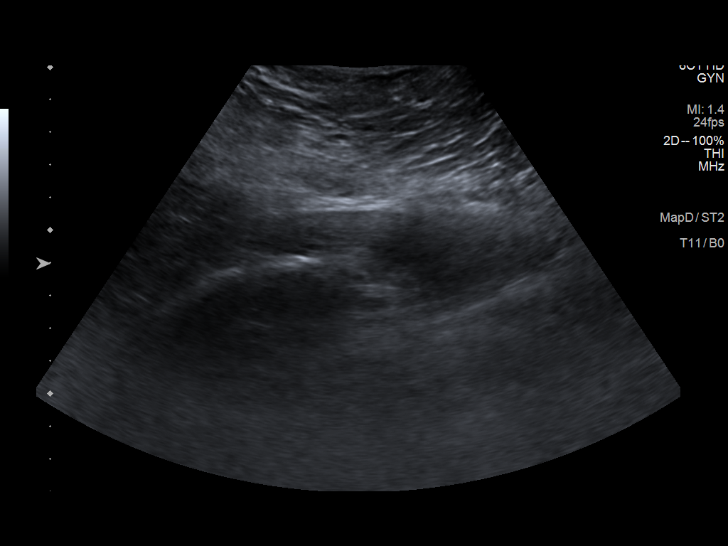
[im 42/124]
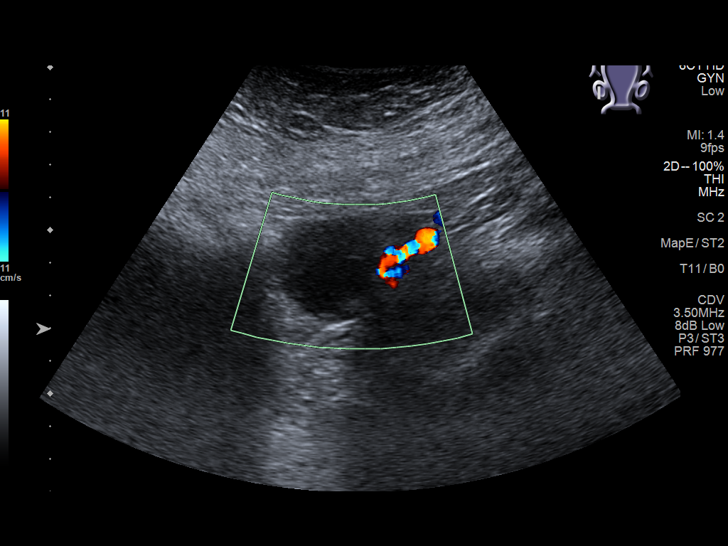
[im 52/124]
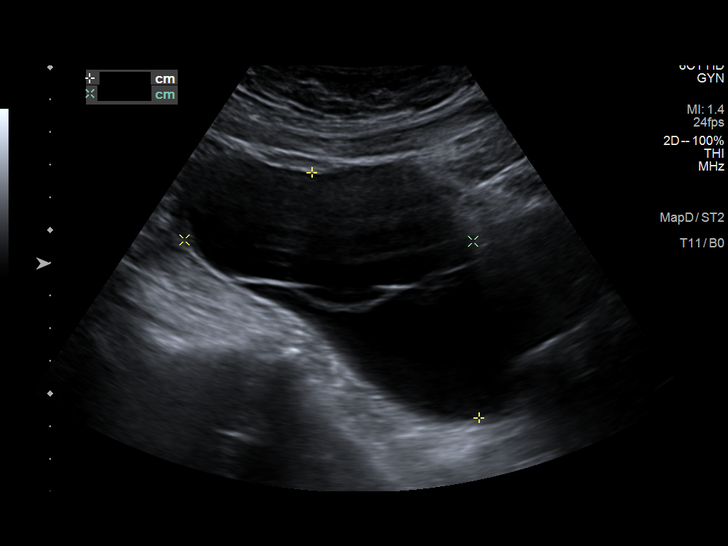
[im 62/124]
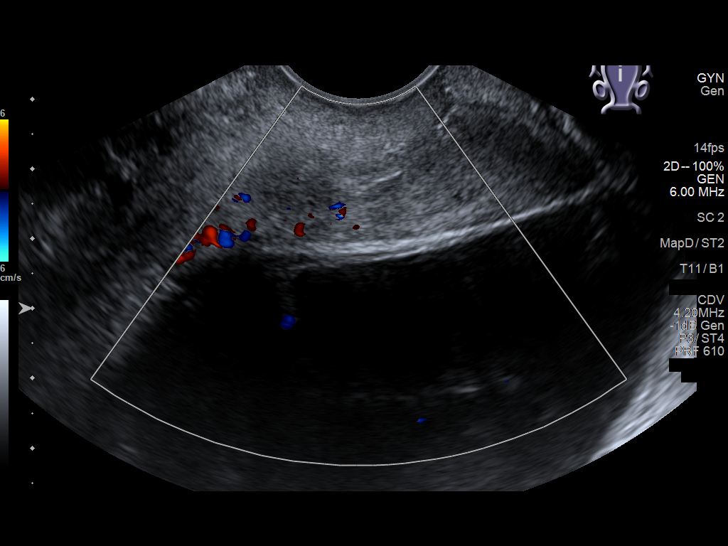
[im 72/124]
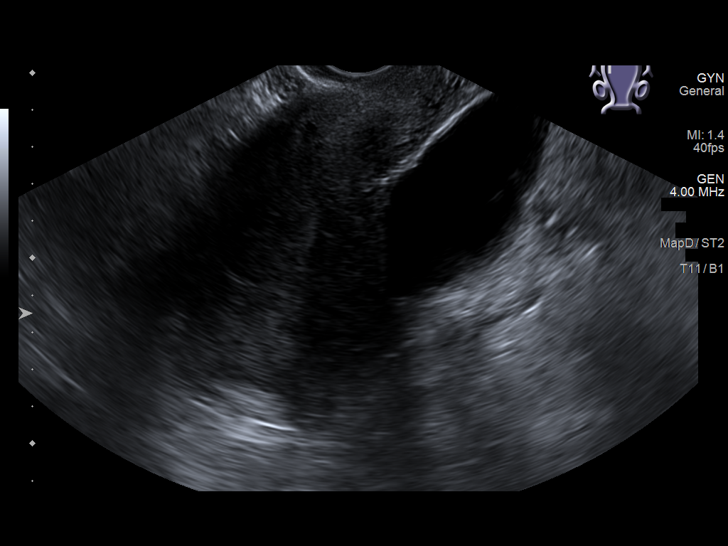
[im 83/124]
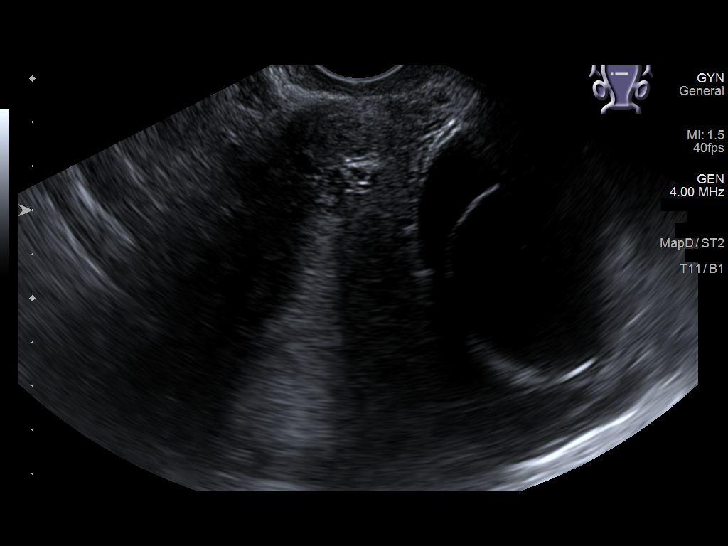
[im 93/124]
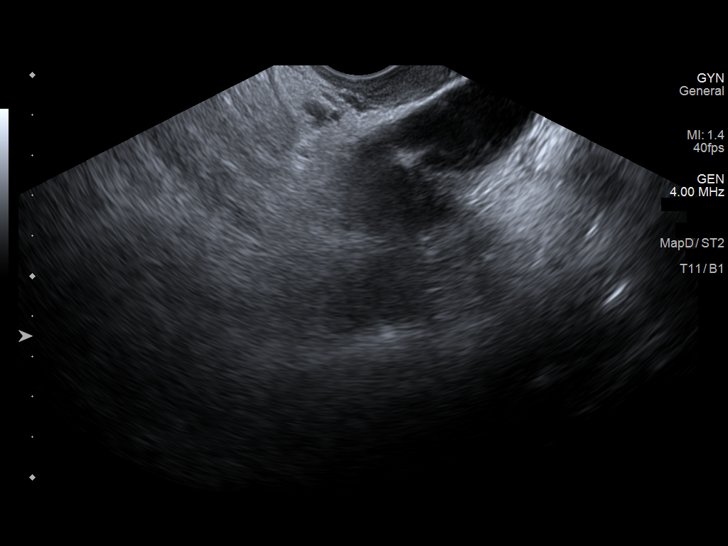
[im 103/124]
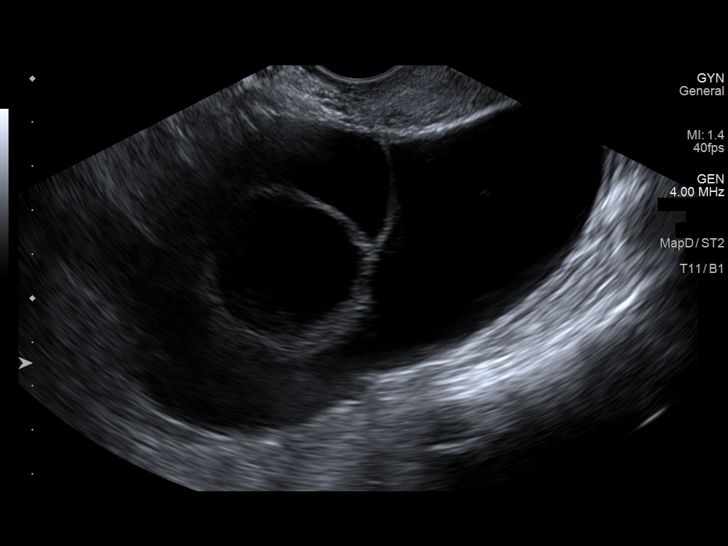
[im 113/124]
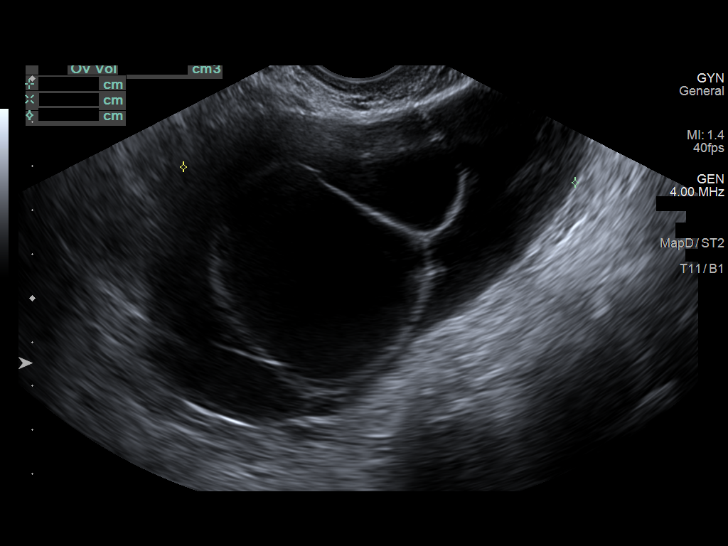
[im 124/124]
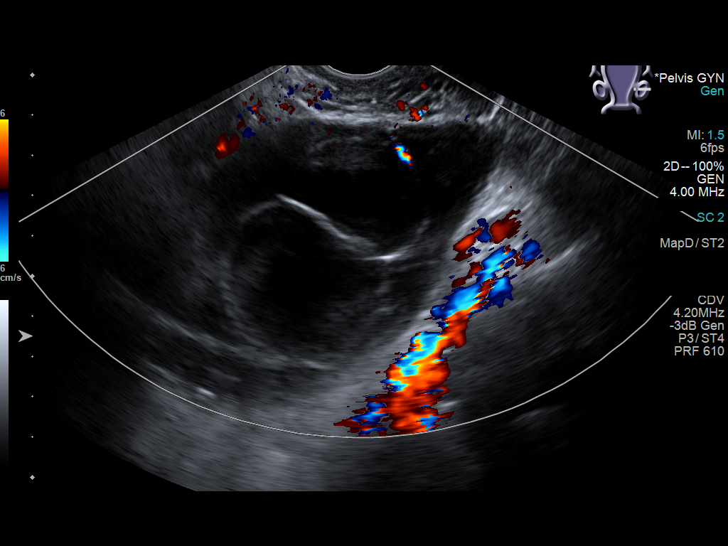

[13 of 25 positions shown; findings below may reference images not displayed]

FINDINGS: Uterus

Measurements: 11.1 x 4.6 x 7.5 cm = volume: 203 mL. Anteverted.
Normal morphology without mass

Endometrium

Thickness: 7 mm.  No endometrial fluid or mass

Right ovary

Measurements: 3.4 x 3.0 x 2.9 cm = volume: 15.4 mL. Normal
morphology without mass

Left ovary

No normal appearing LEFT ovary visualized, see below

Other findings

No free pelvic fluid. Complex cystic mass LEFT adnexa 11.9 x 7.8 x
6.1 cm with volume = 300 cm^3 (previously 12.0 x 9.4 x 4.8 cm with
volume = 280 cm^3) containing multiple septations, some of which
are irregular and show a mild degree of blood flow peripherally.
Questionable mural nodule on cine series up to 13 mm thick by 24 mm
diameter, best demonstrated on cine series. No additional masses.
IMPRESSION: Unremarkable uterus, endometrial complex and RIGHT ovary.

11.9 cm diameter complex cystic mass LEFT adnexa, only minimally
increased in volume since previous study, containing multiple
septations and some peripheral vascularity as well as a potential
mural nodule up to 13 x 24 mm.

In the absence of being able to characterize this lesion by MR due
to presence of a pacemaker, recommend surgical evaluation to exclude
cystic ovarian neoplasm.

These results will be called to the ordering clinician or
representative by the Radiologist Assistant, and communication
documented in the PACS or [REDACTED].

## 2023-03-16 ENCOUNTER — Other Ambulatory Visit (HOSPITAL_COMMUNITY): Payer: Self-pay

## 2023-03-16 DIAGNOSIS — Z95811 Presence of heart assist device: Secondary | ICD-10-CM

## 2023-03-16 DIAGNOSIS — Z7901 Long term (current) use of anticoagulants: Secondary | ICD-10-CM

## 2023-03-17 ENCOUNTER — Other Ambulatory Visit: Payer: Self-pay

## 2023-03-17 ENCOUNTER — Ambulatory Visit (HOSPITAL_COMMUNITY)
Admission: RE | Admit: 2023-03-17 | Discharge: 2023-03-17 | Disposition: A | Discharge status: Home / Self Care | Payer: Medicare Other | Source: Ambulatory Visit | Attending: Cardiology | Admitting: Cardiology

## 2023-03-17 ENCOUNTER — Other Ambulatory Visit (HOSPITAL_COMMUNITY): Payer: Self-pay

## 2023-03-17 ENCOUNTER — Ambulatory Visit (HOSPITAL_COMMUNITY): Payer: Self-pay | Admitting: Pharmacist

## 2023-03-17 DIAGNOSIS — N183 Chronic kidney disease, stage 3 unspecified: Secondary | ICD-10-CM | POA: Diagnosis not present

## 2023-03-17 DIAGNOSIS — E0590 Thyrotoxicosis, unspecified without thyrotoxic crisis or storm: Secondary | ICD-10-CM | POA: Insufficient documentation

## 2023-03-17 DIAGNOSIS — Z95810 Presence of automatic (implantable) cardiac defibrillator: Secondary | ICD-10-CM | POA: Insufficient documentation

## 2023-03-17 DIAGNOSIS — Z8673 Personal history of transient ischemic attack (TIA), and cerebral infarction without residual deficits: Secondary | ICD-10-CM | POA: Insufficient documentation

## 2023-03-17 DIAGNOSIS — I4891 Unspecified atrial fibrillation: Secondary | ICD-10-CM | POA: Diagnosis not present

## 2023-03-17 DIAGNOSIS — I5022 Chronic systolic (congestive) heart failure: Secondary | ICD-10-CM | POA: Diagnosis not present

## 2023-03-17 DIAGNOSIS — Z95811 Presence of heart assist device: Secondary | ICD-10-CM

## 2023-03-17 DIAGNOSIS — Z90722 Acquired absence of ovaries, bilateral: Secondary | ICD-10-CM | POA: Insufficient documentation

## 2023-03-17 DIAGNOSIS — Z79899 Other long term (current) drug therapy: Secondary | ICD-10-CM | POA: Diagnosis not present

## 2023-03-17 DIAGNOSIS — Z90710 Acquired absence of both cervix and uterus: Secondary | ICD-10-CM | POA: Insufficient documentation

## 2023-03-17 DIAGNOSIS — Z4509 Encounter for adjustment and management of other cardiac device: Secondary | ICD-10-CM | POA: Insufficient documentation

## 2023-03-17 DIAGNOSIS — I493 Ventricular premature depolarization: Secondary | ICD-10-CM | POA: Diagnosis not present

## 2023-03-17 DIAGNOSIS — D631 Anemia in chronic kidney disease: Secondary | ICD-10-CM | POA: Insufficient documentation

## 2023-03-17 DIAGNOSIS — Z7985 Long-term (current) use of injectable non-insulin antidiabetic drugs: Secondary | ICD-10-CM | POA: Insufficient documentation

## 2023-03-17 DIAGNOSIS — I428 Other cardiomyopathies: Secondary | ICD-10-CM | POA: Insufficient documentation

## 2023-03-17 DIAGNOSIS — Z7901 Long term (current) use of anticoagulants: Secondary | ICD-10-CM | POA: Insufficient documentation

## 2023-03-17 DIAGNOSIS — I130 Hypertensive heart and chronic kidney disease with heart failure and stage 1 through stage 4 chronic kidney disease, or unspecified chronic kidney disease: Secondary | ICD-10-CM | POA: Insufficient documentation

## 2023-03-17 DIAGNOSIS — I340 Nonrheumatic mitral (valve) insufficiency: Secondary | ICD-10-CM | POA: Insufficient documentation

## 2023-03-17 DIAGNOSIS — Z7982 Long term (current) use of aspirin: Secondary | ICD-10-CM | POA: Diagnosis not present

## 2023-03-17 DIAGNOSIS — Z8543 Personal history of malignant neoplasm of ovary: Secondary | ICD-10-CM | POA: Diagnosis not present

## 2023-03-17 LAB — CBC
HCT: 45.8 % (ref 36.0–46.0)
Hemoglobin: 14.3 g/dL (ref 12.0–15.0)
MCH: 24.4 pg — ABNORMAL LOW (ref 26.0–34.0)
MCHC: 31.2 g/dL (ref 30.0–36.0)
MCV: 78.2 fL — ABNORMAL LOW (ref 80.0–100.0)
Platelets: 259 10*3/uL (ref 150–400)
RBC: 5.86 MIL/uL — ABNORMAL HIGH (ref 3.87–5.11)
RDW: 21.2 % — ABNORMAL HIGH (ref 11.5–15.5)
WBC: 7.5 10*3/uL (ref 4.0–10.5)
nRBC: 0 % (ref 0.0–0.2)

## 2023-03-17 LAB — COMPREHENSIVE METABOLIC PANEL
ALT: 33 U/L (ref 0–44)
AST: 29 U/L (ref 15–41)
Albumin: 4 g/dL (ref 3.5–5.0)
Alkaline Phosphatase: 74 U/L (ref 38–126)
Anion gap: 15 (ref 5–15)
BUN: 19 mg/dL (ref 6–20)
CO2: 23 mmol/L (ref 22–32)
Calcium: 9 mg/dL (ref 8.9–10.3)
Chloride: 94 mmol/L — ABNORMAL LOW (ref 98–111)
Creatinine, Ser: 1.34 mg/dL — ABNORMAL HIGH (ref 0.44–1.00)
GFR, Estimated: 48 mL/min — ABNORMAL LOW (ref >60)
Glucose, Bld: 87 mg/dL (ref 70–99)
Potassium: 3.4 mmol/L — ABNORMAL LOW (ref 3.5–5.1)
Sodium: 132 mmol/L — ABNORMAL LOW (ref 135–145)
Total Bilirubin: 0.7 mg/dL (ref 0.3–1.2)
Total Protein: 7.8 g/dL (ref 6.5–8.1)

## 2023-03-17 LAB — PREALBUMIN: Prealbumin: 26 mg/dL (ref 18–38)

## 2023-03-17 LAB — LACTATE DEHYDROGENASE: LDH: 190 U/L (ref 98–192)

## 2023-03-17 LAB — MAGNESIUM: Magnesium: 1.8 mg/dL (ref 1.7–2.4)

## 2023-03-17 LAB — PROTIME-INR
INR: 2.6 — ABNORMAL HIGH (ref 0.8–1.2)
Prothrombin Time: 28.4 seconds — ABNORMAL HIGH (ref 11.4–15.2)

## 2023-03-17 LAB — TSH: TSH: 9.06 u[IU]/mL — ABNORMAL HIGH (ref 0.350–4.500)

## 2023-03-17 MED ORDER — POTASSIUM CHLORIDE CRYS ER 20 MEQ PO TBCR
80.0000 meq | EXTENDED_RELEASE_TABLET | Freq: Every day | ORAL | 3 refills | Status: DC
  Filled 2023-03-17: qty 270, 67d supply, fill #0

## 2023-03-17 MED ORDER — POTASSIUM CHLORIDE CRYS ER 20 MEQ PO TBCR
20.0000 meq | EXTENDED_RELEASE_TABLET | Freq: Every day | ORAL | 3 refills | Status: AC
  Filled 2023-03-17: qty 90, 90d supply, fill #0
  Filled 2024-01-07: qty 90, 90d supply, fill #1

## 2023-03-17 MED ORDER — AMIODARONE HCL 200 MG PO TABS
200.0000 mg | ORAL_TABLET | Freq: Every day | ORAL | 3 refills | Status: DC
  Filled 2023-03-17 – 2023-04-24 (×3): qty 90, 90d supply, fill #0
  Filled 2023-07-03: qty 90, 90d supply, fill #1
  Filled 2023-08-25 – 2023-09-25 (×2): qty 90, 90d supply, fill #2
  Filled 2023-11-27 – 2023-12-02 (×2): qty 90, 90d supply, fill #3

## 2023-03-17 NOTE — Progress Notes (Addendum)
Patient presented for a 2 month follow up and 1.5 year Intermacs in VAD Clinic today alone. Reports no problems with VAD equipment or concerns with drive line.  Pt denies lightheadedness, dizziness, falls, shortness of breath, and signs of bleeding. Drinking at least 2 L daily.   Pt is participating in healthy weight and wellness program and has been able to increase her exercise routine without any limitation. Currently on 0.5mg  of Wegovy. Appetite has decreased since starting [facility]; however, she has not lost any weight. Weight up 2 pounds since last visit. Pt states she meets with [facility] next Monday for a more extensive assessment with Dr. Rikki Spearing.   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 Pt had follow up with Endocrinology 01/28/23 where Methimazole was discontinued. Thyroid panel drawn today. Will route results to Dr. Lonzo Cloud. Pt has close follow up with Endocrinology.   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 She has not taken Digoxin since 2023.    VAD speed increased to 6000 last visit. Pt states she took a Torsemide Sunday night and has noticed a lot of PI events. Upon interrogation patient having multiple PI & suction events. Pt states she drank 5 bottles of water last night to try and resolve the issue. Discussed with Dr. Shirlee Latch. Speed decreased back to 5900. Pt instructed to notify the VAD team of reoccurrence.   Vital Signs:  Doppler Pressure: UTO  Automatic BP: 80/63 (68) HR: 77 SR SPO2: 100   Weight: 263.8 lbs w/o eqt Last weight: 261.2 lb w/o eqt  VAD Indication: Destination Therapy d/t BMI   VAD interrogation & Equipment Management: Speed: 6000 Flow: 5.5 Power: 5.1 w    PI: 4.4   Alarms: none Events: >100  Fixed speed 6000 >> 5900 Low speed limit: 5700 >> 5600   Primary Controller:  Replace back up battery in 13 months. Back up controller:   Replace back up  battery in 21 months.    Annual Equipment Maintenance on UBC/PM was performed on 09/08/22.    I reviewed the LVAD parameters from today and compared the results to the patient's prior recorded data. LVAD interrogation was NEGATIVE for significant power changes, NEGATIVE for clinical alarms and STABLE for PI events/speed drops. No programming changes were made and pump is functioning within specified parameters. Pt is performing daily controller and system monitor self tests along with completing weekly and monthly maintenance for LVAD equipment.   LVAD equipment check completed and is in good working order. Back-up equipment present. Charged back up battery and performed self-test on equipment.    Exit Site Care: CDI. Drive line anchor secure. Pt denies fever or chills. Patient given 8 weekly kits and 2 packs of large tegaderms for at home use.    Significant Events on VAD Support:  N/A  Patient completed 1500 feet during 6 minute walk test. Tolerated well.   Neurocognitive trail making completed correctly in 1 m 12 s.   [address] Cardiomyopathy, EQ-5D-3L and post-VAD QOL completed by {the patient independently.   [place] Cardiomyopathy Questionnaire     03/17/2023    2:21 PM 09/08/2022    3:45 PM 03/27/2022    1:09 PM  KCCQ-12  1 a. Ability to shower/bathe Not at all limited Not at all limited Not at all limited  1 b. Ability to walk 1 block Not at all limited Not at all limited Slightly limited  1 c. Ability  to hurry/jog Moderately limited Moderately limited Moderately limited  2. Edema feet/ankles/legs Never over the past 2 weeks Never over the past 2 weeks Less than once a week  3. Limited by fatigue Never over the past 2 weeks Less than once a week Less than once a week  4. Limited by dyspnea Never over the past 2 weeks Less than once a week Less than once a week  5. Sitting up / on 3+ pillows Never over the past 2 weeks Never over the past 2 weeks Never over the past 2  weeks  6. Limited enjoyment of life Not limited at all Not limited at all Not limited at all  7. Rest of life w/ symptoms Completely satisfied Completely satisfied Completely satisfied  8 a. Participation in hobbies Did not limit at all Did not limit at all Did not limit at all  8 b. Participation in chores Slightly limited Slightly limited Did not limit at all  8 c. Visiting family/friends Did not limit at all Did not limit at all Did not limit at all     Device: Medtronic Therapies: on VF 240 VT 200 VVI: 40 Last check: 11/05/2022   BP & Labs:  Doppler- unable to obtain as today's visit after multiple attempts   Hgb 14.3 - No S/S of bleeding. Specifically denies vaginal bleeding, melena/BRBPR or nosebleeds.    LDH stable at 190 with established baseline of 250 - 450. Denies tea-colored urine. No power elevations noted on interrogation.   Plan:  No medication changes Coumadin dosing per [name] Return to VAD clinic in 2 months   Addendum: Potassium 3.4 today. Pt states she is only taking as needed when she takes Torsemide. Discussed with Dr.Gustaf Mccarter will increase to daily.  [name] VAD Coordinator  Office: [phone]  24/7 Pager: [phone]     History of Present Illness: [name] is a 52 y.o. 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  She was followed for systolic CHF initially in [place]. Apparently her EF recovered to 35-40%. However, she was admitted to [facility] 09/19/13 for worsening dyspnea and CP. Coronaries were reportedly OK on LHC at that time at [facility]. Echo showed EF of 15-20% with four-chamber enlargement with moderate-severe TR/MR. RHC as below. Rheumatological w/u negative.  CPX in 9/16 showed near-normal functional capacity.  Repeat echo in 6/17 shows that EF remains 20%. Repeat CPX in 1/18 was submaximal but probably only mild  circulatory limitation.  Echo 8/18 showed EF 25-30%, severe LV dilation.  CPX in 6/19 showed low normal functional capacity.  Echo in 10/19 showed EF 20-25%, moderate LV dilation.    Zio patch in 2/20 with 11% PVCs and NSVT, amiodarone started. Zio patch in 3/20 with 13.3% PVCs and NSVT.  She saw Dr. Graciela Husbands, he thought that increased PVCs were electromechanical in nature due to worsening heart failure.  Repeat Zio patch on amiodarone in 1/21 showed 6.2% PVCs.   She was admitted in 4/20 with VT in setting of hypokalemia.    CPX in 9/20 showed mild functional limitation, more due to body habitus than heart failure.  Echo in 4/21 showed EF 20% with severe LV dilation, diffuse hypokinesis, normal RV size and systolic function.   In 8/21, she had a shock for VF in the setting of hypokalemia.   Patient was admitted in 10/21 with CVA involving left MCA  territory.  This was thought to be cardioembolic but LV thrombus was not visualized and no atrial fibrillation has been seen.  Echo in 10/21 showed EF 20-25%, severe LV dilation (no LV thrombus seen), mildly decreased RV systolic function, mild-moderate MR. She was taken by IR for thrombectomy and stent placement, now on ASA 81 and ticagrelor.   She had recurrent VT in 2/22, quinidine was added by EP.  Spironolactone and Coreg were decreased due to low BP and lightheadedness.    Echo in 4/22 showed EF 25% with severe LV dilation and global hypokinesis, normal RV, mild-moderate MR.   CPX (8/22) showed peak VO2 14.3, VE/VCO2 slope 42, RER 1.09 => mild to moderate HF limitation, elevated VE/VCO2 slope is likely false from end exercise hyperventilation. Restrictive lung physiology from obesity.   Patient was admitted in 3/23 with recurrent CHF exacerbation with low cardiac output.  She was also noted to be hyperthyroidism but amiodarone had to be continued due to VT and poorly tolerated atrial fibrillation.  She required Impella 5.5 placement and eventual HM3  LVAD placement on 09/04/21.  Post-op course complicated by atrial fibrillation, malnutrition, and deconditioning.  She went to CIR prior to discharge.   Patient developed heavy vaginal bleeding in 7/23, she was seen in the ER and required 2 units PRBCs.  She was admitted in 8/23 and had hysterectomy + BSO, LVAD speed increased to 5900 rpm on ramp echo.  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    The patient was admitted overnight in 12/23 with severe posterior epistaxis requiring nasal pacing.   In 2/24, she had VF with shock.  Amiodarone was restarted.   Echo in 7/24 showed  EF < 20, mild RV dysfunction with normal size, interventricular septum shifted mildly to right.   She returns for followup of CHF and LVAD.  Weight is up 2 lbs.  Still has not been able to lose weight on semaglutide. She has chronic frequent PI events but has been having occasional suction events.  No lightheadedness.  No significant exertional dyspnea, exercise capacity is good.  She has been working out several days/week.  No problem with stairs.  Endocrinology has taken her off methimazole, she is worried that her weight gain could be related to hypothyroidism. She has been taking torsemide only prn (every couple of weeks).   LVAD interrogation: Please see LVAD nursing note above.   Labs: (11/09/13): Dig level 0.5, K 3.6, Creatinine 0.83, pro-BNP 622 (12/19/13): K 3.6, creatinine 0.8 (7/15): K 4.1, creatinine 0.91 (8/15): K 3.9, creatinine 0.91 (10/15): K 4, creatinine 0.93, proBNP 129 (12/15): K 4, creatinine 0.9, digoxin 0.4 (5/16): K 4, creatinine 0.86 (9/16): K 3.6, creatinine 0.86, digoxin < 0.2 (5/17): K 4.1, creatinine 0.95, digoxin 0.3 (5/18): K 3.4, creatinine 0.95, TSH elevated but free T3 and free T4 normal.  (8/18): K 3.9, creatinine 0.87, BNP 108  (11/18): K 3.5, creatinine 1.04, digoxin 0.4 (1/19): LDL 91, HDL 47 (3/19): digoxin 0.7, K 4, creatinine 0.99 (4/19):  TSH normal, LDL 95 (9/19): LDL 99, HDL 45, K 4, creatinine 0.96 (10/19): K 3.6, creatinine 0.85, digoxin 0.5 (2/20): LDL 103 (3/20): K 3.9, creatinine 1.05 (4/20): K 3.9, creatinine 1.23 => 1.15, TSH 8, digoxin 0.3, LFTs normal 6/20): K 4.3, creatinine 1.07, LFTs normal, digoxin 0.5 (7/20): K 3.8, creatinine 1.16 (8/20): digoxin < 0.2, LDL 135 (1/21): K 3.9, creatinine 1.05, LDL 171  (4/21): K 4.2, creatinine 0.95, LDL  127, TSH normal, LFTs normal (10/21): K 2.8, creatinine 1.21, LDL 130 (11/21): K 4.5, creatinine 1.3 => 1.2, LFTs normal, digoxin 1.3, TSH normal (3/22): K 4.1, creatinine 1.36 (4/22): hgb 10.8 (7/22): Transferrin 8%, digoxin 0.9, hgb 10.1, K 3.9, creatinine 1.42 (8/22): digoxin 0.4 (9/22): K 4.2, creatinine 1.32 (4/23): hgb 11, WBCs 13, K 3.9 => 3.8, creatinine 0.9 => 0.98, albumin 3.2, ALT 45, AST 31, LDH 272 => 244, TSH 0.235, free T4 1.84 (7/23): hgb 8, TSH normal 8/23): K 3.8, creatinine 1.15, hgb 11.4 (9/23): K 3.7, creatinine 1.36 (10/23): K 3.6, creatinine 1.42 (11/23): TSH normal (1/24): K 3.9, creatinine 1.17 (2/24): TSH 4.75, hgb 11.3, K 3.4, creatinine 1.35, LFTs normal (3/24): LFTs normal (5/24): K 4, creatinine 1.4, hgb 12.5, LDH 193 (7/24): K 4, creatinine 1.3, LFTs normal, hgb 12.3, LDL 115, LDH 221  PMH: 1. OSA: Using CPAP.  2. PVCs:  - Zio patch 2/20: 11% PVCs - Zio patch 3/20: 13.3% PVCs - Zio patch 1/21: 6.2% PVCs 3. VT/VF: 8/21, in setting of hypokalemia.  2/22 VT, quinidine added.  - VT 2/24, restarted amiodarone 4. 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  No LV thrombus visualized and atrial fibrillation has not been visualized.  - IR thrombectomy with stent placement in 10/21.  5. Chronic systolic CHF: Nonischemic cardiomyopathy:   - Coronary angiography 2015 at [facility]: Normal coronaries.  - Echo (8/15) with EF 20%, moderate LV dilation, diffuse hypokinesis, normal RV size with mildly decreased systolic function, mild MR.  - Echo (5/16) with EF  20%, spherical LV with diffuse hypokinesis, mild MR.  - Echo (6/17) with EF 20%, diffuse hypokinesis, moderate LV dilation, normal RV, mild to moderate MR.  - RHC (12/17): mean RA 5, PA 56/31, mean PCWP 24, CI 2, PVR 3.7 WU - Echo (8/18) with EF 25-30%, severe LV dilation, severe LAE.  - Echo (2/20) with EF 20-25%, moderate LV dilation, moderate MR.  - Echo (4/21) with EF 20-25%, severe LV dilation, mild-moderate MR, normal RV, PASP 26 mmHg.  - Echo (10/21) with EF 20-25%, severe LV dilation (no LV thrombus seen), mildly decreased RV systolic function, mild-moderate MR. - CPX in 9/20 showed mild functional limitation, more due to body habitus than heart failure.  - CPX in 8/21: Peak VO2 11.6, VE/VCO2 32, RER 1.2.  Mild HF limitation.  - Echo (4/22): EF 25% with severe LV dilation and global hypokinesis, normal RV, mild-moderate MR.  - CPX (8/22): peak VO2 14.3, VE/VCO2 slope 42, RER 1.09 => mild to moderate HF limitation, elevated VE/VCO2 slope is likely false from end exercise hyperventilation. Restrictive lung physiology from obesity.  - HM3 LVAD placement 09/04/21.  - Echo (7/24): EF < 20, mild RV dysfunction with normal size, interventricular septum shifted mildly to right. 6. PFTs (1/22): Near normal.  7. Fe deficiency anemia 8. 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  10. Left ovarian mass -  hysterectomy + BSO.  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      Current Outpatient Medications  Medication Sig Dispense Refill   ALPRAZolam (XANAX) 0.25 MG tablet Take 1 tablet (0.25 mg total) by mouth 2 (two) times daily as needed for anxiety. 20 tablet 2   amiodarone (PACERONE) 200 MG tablet Take 1 tablet (200 mg total) by mouth daily. 90 tablet 3   aspirin EC 81 MG tablet Take 1 tablet (81 mg total) by mouth daily. Swallow whole. 90 tablet 3  dapagliflozin propanediol (FARXIGA) 10 MG TABS tablet Take 1 tablet  (10 mg total) by mouth daily before breakfast. 90 tablet 3   estradiol (CLIMARA - DOSED IN MG/24 HR) 0.0375 mg/24hr patch Place 1 patch (0.0375 mg total) onto the skin every 7 (seven) days. 4 patch 11   miconazole (MICATIN) 2 % cream Apply 1 Application topically daily. Apply topically to skin folds on the vulva to treat yeast 30 g 0   potassium chloride SA (KLOR-CON M) 20 MEQ tablet Take 1 tablet (20 mEq total) by mouth daily. 270 tablet 3   quiniDINE gluconate 324 MG CR tablet Take 1 tablet (324 mg total) by mouth 2 (two) times daily. 60 tablet 6   rosuvastatin (CRESTOR) 40 MG tablet Take 1 tablet (40 mg total) by mouth daily. 90 tablet 3   Semaglutide-Weight Management (WEGOVY) 0.25 MG/0.5ML SOAJ Inject 0.25 mg into the skin once a week for 4 weeks then increase to 0.5 mg (Patient taking differently: Inject 0.5 mg into the skin once a week.) 2 mL 0   Semaglutide-Weight Management (WEGOVY) 0.5 MG/0.5ML SOAJ Inject 0.5 mg into the skin once a week for 4 weeks then increase to 1 mg 2 mL 0   Semaglutide-Weight Management (WEGOVY) 1 MG/0.5ML SOAJ Inject 1 mg into the skin once a week. 2 mL 0   sildenafil (REVATIO) 20 MG tablet Take 1 tablet (20 mg total) by mouth 3 (three) times daily. 90 tablet 6   spironolactone (ALDACTONE) 25 MG tablet Take 1 tablet (25 mg total) by mouth daily. Restart on 02/10/22. 45 tablet 3   torsemide (DEMADEX) 20 MG tablet Take 1 tablet (20 mg total) by mouth every other day. Take 1 tablet (20 mg) by mouth as needed, or as directed by heart failure clinic. 180 tablet 3   Vitamin D, Ergocalciferol, (DRISDOL) 1.25 MG (50000 UNIT) CAPS capsule Take 1 capsule (50,000 Units total) by mouth every 7 (seven) days. 13 capsule 1   warfarin (COUMADIN) 2 MG tablet Take 4 mg (2 tabs) daily or as directed by LVAD Clinic. 180 tablet 3   No current facility-administered medications for this encounter.    Allergies:   Patient has no known allergies.   Social History:  The patient  reports  that she has never smoked. She has never used smokeless tobacco. She reports that she does not drink alcohol and does not use drugs.   Family History:  The patient's family history includes Heart Problems in her mother; Heart disease in her maternal grandmother and mother; High blood pressure in her father; Hypertension in her father and mother; Obesity in her mother; Stroke in her father.   ROS:  Please see the history of present illness.   All other systems are personally reviewed and negative.   Exam:    MAP 68 General: Well appearing this am. NAD.  HEENT: Normal. Neck: Supple, JVP 7-8 cm. Carotids OK.  Cardiac:  Mechanical heart sounds with LVAD hum present.  Lungs:  CTAB, normal effort.  Abdomen:  NT, ND, no HSM. No bruits or masses. +BS  LVAD exit site: Well-healed and incorporated. Dressing dry and intact. No erythema or drainage. Stabilization device present and accurately applied. Driveline dressing changed daily per sterile technique. Extremities:  Warm and dry. No cyanosis, clubbing, rash, or edema.  Neuro:  Alert & oriented x 3. Cranial nerves grossly intact. Moves all 4 extremities w/o difficulty. Affect pleasant    Recent Labs: 03/17/2023: ALT 33; BUN 19; Creatinine, Ser 1.34;  Hemoglobin 14.3; Magnesium 1.8; Platelets 259; Potassium 3.4; Sodium 132; TSH 9.060  Personally reviewed   Wt Readings from Last 3 Encounters:  03/17/23 119.7 kg (263 lb 12.8 oz)  03/10/23 117.9 kg (260 lb)  02/09/23 116.1 kg (256 lb)    ASSESSMENT AND PLAN:   1. Chronic systolic HF: Nonischemic cardiomyopathy.  She has a Medtronic ICD. Cause for cardiomyopathy is uncertain, initially identified peri-partum (after son's birth), so cannot rule out peri-partum CMP.  On and off, she has had frequent PVCs.   RHC in 8/15 showed normal filling pressures and preserved cardiac output. Echo in 4/22 with EF 25%, severe LV dilation.  CPX (8/22) with mild-moderate HF limitation.  Echo in 3/23 with EF < 20% with  severe dilation, normal RV size with mildly decreased systolic function, moderate-severe functional MR with dilated IVC. 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 Impella 5.5 placed 08/29/21.  Patient had HM3 LVAD placed 09/04/21 and speed adjusted by ramp echo up to 5800 rpm and again up to 5900 rpm. In 7/24, LVAD speed increased to 6000 rpm under echo guidance. However, she has frequent PIs now and some suction events.  NYHA class I-II, not volume overloaded.   - With suction events, decrease speed to 5900 rpm.  - She can continue to use torsemide prn.   - Continue Farxiga 10 mg daily.  - Continue spironolactone 25 mg daily.   - She should continue ASA 81 mg daily with history of CVA.  - Continue sildenafil 20 mg tid  - Warfarin for VAD, goal INR 2-2.5.   2. 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  She had been on Levoxyl at home prior to 3/23 admission, noted to be hyperthyroid in 3/23.  This likely contributes to her CHF. She had recurrent VT and poorly tolerated AF which made it difficult for [name] to stop amiodarone. Initially treated w/ IV Solumedrol -> transitioned to prednisone and methimazole.  Endocrinology following, prednisone has been titrated off, now titrated off methimazole.  We had to restart her on amiodarone with VF.  - Continue amiodarone 200 daily, check LFTs and will need regular eye exam.  - She has followup with endocrinology, will check TSH today as she is afraid she has become hypothyroid.  3. PVCs/VT: EP consulted given complicated situation with hyperthyroidism and VT. She had a VF episode and amiodarone was restarted.  - Continue quinidine.  - Continue amiodarone, follow TSH closely.  4. CVA: CVA in 10/21, had thrombectomy with stent placement.  No LV thrombus noted and no atrial  fibrillation noted.  - Would continue ASA 81 daily.   5. Anemia: CBC today.  6. CKD stage 3: Follow creatinine closely.  7. Mitral regurgitation: Moderate-severe functional MR pre-LVAD.  Unlikely to be Mitraclip candidate with severely dilated LV (>7 cm). Mitral regurgitation was mild on 7/24 echo.  8. Left ovarian mass: Hysterectomy + BSO.  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   9. Atrial fibrillation: Tolerates poorly.  NSR today.  10. Epistaxis: No recurrence.   Followup in 2 months   Marca Ancona, MD  03/17/2023  [facility] [address] [facility] [place] [address] [phone] (office) [phone] (fax)

## 2023-03-17 NOTE — Patient Instructions (Signed)
No medication changes today Follow up 2 months in VAD Clinic Coumadin dosing per Leotis Shames [facility]

## 2023-03-18 LAB — T3: T3, Total: 92 ng/dL (ref 71–180)

## 2023-03-23 ENCOUNTER — Encounter (INDEPENDENT_AMBULATORY_CARE_PROVIDER_SITE_OTHER): Payer: Self-pay | Admitting: Internal Medicine

## 2023-03-23 ENCOUNTER — Ambulatory Visit (INDEPENDENT_AMBULATORY_CARE_PROVIDER_SITE_OTHER): Payer: Medicare Other | Admitting: Internal Medicine

## 2023-03-23 VITALS — Temp 97.9°F | Ht 63.0 in | Wt 257.0 lb

## 2023-03-23 DIAGNOSIS — N1831 Chronic kidney disease, stage 3a: Secondary | ICD-10-CM | POA: Diagnosis not present

## 2023-03-23 DIAGNOSIS — Z6841 Body Mass Index (BMI) 40.0 and over, adult: Secondary | ICD-10-CM

## 2023-03-23 DIAGNOSIS — I428 Other cardiomyopathies: Secondary | ICD-10-CM | POA: Diagnosis not present

## 2023-03-23 NOTE — Assessment & Plan Note (Signed)
Patient is on LVAD and GBMT.  She has had a decreased GFR since starting treatment so I suspect medication related.  Most recent electrolytes are within normal limits.  She needs to lose weight to qualify for heart transplantation.  At present time there is no signs of decompensated heart failure or overload.

## 2023-03-23 NOTE — Assessment & Plan Note (Signed)
I reviewed 2 years of labs which showed a decrease in GFR as of last year coinciding with institution of GBMT.  She is also on a fluid restriction.  Her decreased in GFR may be medication related as long as is not more than 30% from baseline would be acceptable.  She she is on a fluid restriction and is avoiding nephrotoxins.  She will continue to work with heart failure clinic.  Losing 10 to 15% of body weight may improve condition.  Continue with weight loss therapy

## 2023-03-23 NOTE — Assessment & Plan Note (Signed)
[name] has been tracking and journaling calories and is staying under 1200 cal and getting about 70 g of protein per day.  She has also been exercising but despite these interventions she has trouble losing weight.  She is on amiodarone and TSH was recently found to be increased.  She reports having a problem with with her weight since under the age of 55 and also has a strong family history so I do suspect this is a genetic contributor to her obesity.  She also appears to be diet resistant and therefore recommend an emphasis on physical activity as allowed by her cardiac condition.  She has a prescription for Wegovy 1 mg once a week which was prescribed by primary care team I suggest that she try this dose but if there is no clinical response she may be a nonresponder and I would recommend discontinuing medication.  She also finds medication has not been helpful thus far.  She has adequate orixegenic control as is.

## 2023-03-23 NOTE — Progress Notes (Signed)
Office: [phone]  /  Fax: [phone]  WEIGHT SUMMARY AND BIOMETRICS  Vitals Temp: 97.9 F (36.6 C) BP: -- (LVAD) SpO2: 95 %   Anthropometric Measurements Height: 5\' 3"  (1.6 m) Weight: 257 lb (116.6 kg) BMI (Calculated): 45.54 Weight at Last Visit: 256 lb Weight Lost Since Last Visit: 0 lb Weight Gained Since Last Visit: 1 lb Starting Weight: 243 lb Total Weight Loss (lbs): 0 lb (0 kg) Peak Weight: 265 lb   Body Composition  Body Fat %: 49 % Fat Mass (lbs): 126 lbs Muscle Mass (lbs): 124.4 lbs Total Body Water (lbs): 88.4 lbs Visceral Fat Rating : 17    No data recorded Today's Visit #: 12  Starting Date: 04/09/22   HPI  Chief Complaint: OBESITY  Discussed the use of AI scribe software for clinical note transcription with the patient, who gave verbal consent to proceed.  History of Present Illness       Marcelle is here to discuss her progress with her obesity treatment plan. She is on the the Category 2 Plan and states she is following her eating plan approximately 95 % of the time. She states she is exercising 60 minutes 3 times per week.  The patient, with a history of non-ischemic cardiomyopathy and insulin resistance, presents for a follow-up consultation regarding obesity. The patient's weight loss is crucial for improving candidacy for heart transplantation. The patient is currently on Wegovy 0.5mg  once a week, prescribed by her primary care physician.  The patient's metabolism was tested in October of the previous year, revealing a metabolic rate of 2956, which is close to the expected rate. However, the patient suspects a slowing metabolism due to recent thyroid test results. The patient is on amiodarone, which has previously affected her thyroid function. The patient's potassium level was noted to be 3.4, and she has been advised to take potassium supplements daily. The patient's kidney function, as indicated by a GFR of 48, is close to previous  levels, likely affected by heart medications.  The patient's daily caloric intake, tracked via an app, averages around 1100 calories, which is lower than the recommended 1300 calories for weight loss. The patient reports often forgetting to eat due to lack of hunger, attributed to the effects of Wegovy. The patient's diet typically consists of a protein shake in the morning, a sandwich or a Lean Cuisine meal in the afternoon, and a protein with vegetables for dinner. The patient also consumes fruits like apples and peaches. The patient's protein intake is around 77 grams per day, which is considered adequate given her kidney function.  The patient engages in physical activity, primarily walking on a treadmill, for about an hour daily. The patient breaks up her exercise into 15-minute intervals, interspersed with light weight lifting. The patient's physical activity is not restricted by her heart doctor, and she is advised to do what is comfortable.  The patient has a long-standing history of obesity, dating back to childhood, with a family history of obesity. The patient's weight gain did not correlate with the start of amiodarone. The patient's heart problems began postpartum, about 25 years ago, following a complicated delivery. The patient's sleep study results were normal, ruling out sleep apnea as a cause for weight gain. The patient's fluid intake is not restricted due to the need for adequate hydration for heart pump function. 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  Pharmacotherapy  for weight loss: She is currently taking Wegovy without clinical response and without side effects..    ASSESSMENT AND PLAN  TREATMENT PLAN FOR OBESITY:  Recommended Dietary Goals  Cesia is currently in the action stage of change. As such, her goal is to continue weight management  plan. She has agreed to: continue current plan  Behavioral Intervention  We discussed the following Behavioral Modification Strategies today: increasing lean protein intake, decreasing simple carbohydrates , increasing vegetables, increasing lower glycemic fruits, increasing water intake, continue to practice mindfulness when eating, and planning for success.  Additional resources provided today: None  Recommended Physical Activity Goals  Pearlene has been advised to work up to 150 minutes of moderate intensity aerobic activity a week and strengthening exercises 2-3 times per week for cardiovascular health, weight loss maintenance and preservation of muscle mass.   She has agreed to :  Increase the intensity, frequency or duration of strengthening exercises , Increase the intensity, frequency or duration of aerobic exercises  , and as tolerated based on cardiac restrictions.  Pharmacotherapy We discussed various medication options to help Viveca with her weight loss efforts and we both agreed to : increase Wegovy to 1 mg once weekly, this is being managed by her primary care team.  If she does not have a clinical response at this dose she is likely a nonresponder.  ASSOCIATED CONDITIONS ADDRESSED TODAY  Stage 3a chronic kidney disease (HCC) Assessment & Plan: I reviewed 2 years of labs which showed a decrease in GFR as of last year coinciding with institution of GBMT.  She is also on a fluid restriction.  Her decreased in GFR may be medication related as long as is not more than 30% from baseline would be acceptable.  She she is on a fluid restriction and is avoiding nephrotoxins.  She will continue to work with heart failure clinic.  Losing 10 to 15% of body weight may improve condition.  Continue with weight loss therapy   Obesity, Class III, BMI 40-49.9 (morbid obesity) (HCC) Assessment & Plan: Aziza has been tracking and journaling calories and is staying under 1200 cal and getting about  70 g of protein per day.  She has also been exercising but despite these interventions she has trouble losing weight.  She is on amiodarone and TSH was recently found to be increased.  She reports having a problem with with her weight since under the age of 18 and also has a strong family history so I do suspect this is a genetic contributor to her obesity.  She also appears to be diet resistant and therefore recommend an emphasis on physical activity as allowed by her cardiac condition.  She has a prescription for Wegovy 1 mg once a week which was prescribed by primary care team I suggest that she try this dose but if there is no clinical response she may be a nonresponder and I would recommend discontinuing medication.  She also finds medication has not been helpful thus far.  She has adequate orixegenic control as is.   NICM (nonischemic cardiomyopathy) (HCC) Assessment & Plan: Patient is on LVAD and GBMT.  She has had a decreased GFR since starting treatment so I suspect medication related.  Most recent electrolytes are within normal limits.  She needs to lose weight to qualify for heart transplantation.  At present time there is no signs of decompensated heart failure or overload.     PHYSICAL EXAM:  Temperature 97.9 F (36.6 C), height 5'  3" (1.6 m), weight 257 lb (116.6 kg), last menstrual period 03/28/2021, SpO2 95%. Body mass index is 45.53 kg/m.  General: She is overweight, cooperative, alert, well developed, and in no acute distress. PSYCH: Has normal mood, affect and thought process.   HEENT: EOMI, sclerae are anicteric. Lungs: Normal breathing effort, no conversational dyspnea. Extremities: No edema.  Neurologic: No gross sensory or motor deficits. No tremors or fasciculations noted.    DIAGNOSTIC DATA REVIEWED:  BMET    Component Value Date/Time   NA 132 (L) 03/17/2023 0903   NA 138 02/08/2020 0948   NA 138 09/24/2013 0536   K 3.4 (L) 03/17/2023 0903   K 3.7 09/24/2013  0536   CL 94 (L) 03/17/2023 0903   CL 107 09/24/2013 0536   CO2 23 03/17/2023 0903   CO2 25 09/24/2013 0536   GLUCOSE 87 03/17/2023 0903   GLUCOSE 82 09/24/2013 0536   BUN 19 03/17/2023 0903   BUN 18 02/08/2020 0948   BUN 11 09/24/2013 0536   CREATININE 1.34 (H) 03/17/2023 0903   CREATININE 1.08 09/24/2013 0536   CALCIUM 9.0 03/17/2023 0903   CALCIUM 8.6 09/24/2013 0536   GFRNONAA 48 (L) 03/17/2023 0903   GFRNONAA >60 09/24/2013 0536   GFRAA >60 02/23/2020 0903   GFRAA >60 09/24/2013 0536   Lab Results  Component Value Date   HGBA1C 5.5 01/02/2023   HGBA1C 5.9 09/19/2013   Lab Results  Component Value Date   INSULIN 17.8 04/09/2022   INSULIN 22.5 10/05/2017   Lab Results  Component Value Date   TSH 9.060 (H) 03/17/2023   CBC    Component Value Date/Time   WBC 7.5 03/17/2023 0903   RBC 5.86 (H) 03/17/2023 0903   HGB 14.3 03/17/2023 0903   HGB 10.1 (L) 12/25/2020 1132   HCT 45.8 03/17/2023 0903   HCT 33.8 (L) 12/25/2020 1132   PLT 259 03/17/2023 0903   PLT 430 12/25/2020 1132   MCV 78.2 (L) 03/17/2023 0903   MCV 73 (L) 12/25/2020 1132   MCV 85 09/23/2013 0443   MCH 24.4 (L) 03/17/2023 0903   MCHC 31.2 03/17/2023 0903   RDW 21.2 (H) 03/17/2023 0903   RDW 17.0 (H) 12/25/2020 1132   RDW 15.4 (H) 09/23/2013 0443   Iron Studies    Component Value Date/Time   IRON 18 (L) 12/03/2021 1021   TIBC 351 12/03/2021 1021   FERRITIN 17 12/03/2021 1021   IRONPCTSAT 5 (L) 12/03/2021 1021   Lipid Panel     Component Value Date/Time   CHOL 204 (H) 01/02/2023 0926   CHOL 171 04/09/2022 1147   CHOL 183 09/20/2013 0424   TRIG 77.0 01/02/2023 0926   TRIG 105 09/20/2013 0424   HDL 73.30 01/02/2023 0926   HDL 62 04/09/2022 1147   HDL 40 09/20/2013 0424   CHOLHDL 3 01/02/2023 0926   VLDL 15.4 01/02/2023 0926   VLDL 21 09/20/2013 0424   LDLCALC 115 (H) 01/02/2023 0926   LDLCALC 94 04/09/2022 1147   LDLCALC 122 (H) 09/20/2013 0424   Hepatic Function Panel      Component Value Date/Time   PROT 7.8 03/17/2023 0903   PROT 7.8 03/10/2018 1507   PROT 7.7 09/19/2013 0431   ALBUMIN 4.0 03/17/2023 0903   ALBUMIN 4.5 03/10/2018 1507   ALBUMIN 3.5 09/19/2013 0431   AST 29 03/17/2023 0903   AST 36 09/19/2013 0431   ALT 33 03/17/2023 0903   ALT 32 09/19/2013 0431   ALKPHOS 74 03/17/2023  0903   ALKPHOS 65 09/19/2013 0431   BILITOT 0.7 03/17/2023 0903   BILITOT 0.4 03/10/2018 1507   BILITOT 0.4 09/19/2013 0431   BILIDIR 0.2 09/01/2021 0331   IBILI 0.8 09/01/2021 0331      Component Value Date/Time   TSH 9.060 (H) 03/17/2023 0903   TSH 7.98 (H) 01/28/2023 0846   Nutritional Lab Results  Component Value Date   VD25OH 18.27 (L) 01/02/2023   VD25OH 19.1 (L) 04/09/2022   VD25OH 30.5 12/01/2019     Return in about 4 weeks (around 04/20/2023) for For Weight Mangement with Dr. Rikki Spearing 40 minutes complex obesity.. She was informed of the importance of frequent follow up visits to maximize her success with intensive lifestyle modifications for her multiple health conditions.   ATTESTASTION STATEMENTS:  Reviewed by clinician on day of visit: allergies, medications, problem list, medical history, surgical history, family history, social history, and previous encounter notes.     Worthy Rancher, MD

## 2023-03-24 ENCOUNTER — Inpatient Hospital Stay: Payer: Medicare Other | Attending: Gynecologic Oncology

## 2023-03-24 ENCOUNTER — Telehealth: Payer: Self-pay | Admitting: *Deleted

## 2023-03-24 DIAGNOSIS — R3129 Other microscopic hematuria: Secondary | ICD-10-CM

## 2023-03-24 DIAGNOSIS — Z86018 Personal history of other benign neoplasm: Secondary | ICD-10-CM | POA: Diagnosis not present

## 2023-03-24 LAB — URINALYSIS, COMPLETE (UACMP) WITH MICROSCOPIC
Bilirubin Urine: NEGATIVE
Glucose, UA: >=500 mg/dL — AB
Ketones, ur: NEGATIVE mg/dL
Leukocytes,Ua: NEGATIVE
Nitrite: NEGATIVE
Protein, ur: NEGATIVE mg/dL
Specific Gravity, Urine: 1.026 (ref 1.005–1.030)
pH: 6 (ref 5.0–8.0)

## 2023-03-24 NOTE — Telephone Encounter (Signed)
-----   Message from Doylene Bode sent at 03/24/2023  2:40 PM EDT ----- Please let the patient know her urine is still showing a small amount of hemoglobin. There is now a large amount of glucose which can be caused by the farxiga. How have her sugars been running? We will send urine results to PCP for further follow up. Does she see blood in her urine when she wipes? She has had blood in her urine in the past. Has she ever seen a urologist?

## 2023-03-24 NOTE — Telephone Encounter (Signed)
Spoke with Ms. Yonan and relayed message from Warner Mccreedy, NP that patient's urine is still showing a small amount of hemoglobin. And there is now a large amount of glucose which can be caused by the farxiga. Pt states her sugars have been good. Pt states she is not seeing blood in her urine and not even when she wipes. She has had blood in her urine in the past. Pt states she has never seen a urologist. Advised patient that her urine results will be sent to PCP for further follow up. Pt thanked the office for calling.

## 2023-03-25 ENCOUNTER — Other Ambulatory Visit: Payer: Self-pay | Admitting: Nurse Practitioner

## 2023-03-25 ENCOUNTER — Other Ambulatory Visit: Payer: Self-pay

## 2023-03-25 DIAGNOSIS — R3129 Other microscopic hematuria: Secondary | ICD-10-CM

## 2023-03-26 ENCOUNTER — Telehealth: Payer: Self-pay | Admitting: *Deleted

## 2023-03-26 ENCOUNTER — Other Ambulatory Visit (INDEPENDENT_AMBULATORY_CARE_PROVIDER_SITE_OTHER): Payer: Medicare Other

## 2023-03-26 ENCOUNTER — Encounter: Payer: Self-pay | Admitting: Dietician

## 2023-03-26 ENCOUNTER — Ambulatory Visit (HOSPITAL_COMMUNITY): Payer: Self-pay | Admitting: Pharmacist

## 2023-03-26 ENCOUNTER — Encounter: Payer: Medicare Other | Attending: Family Medicine | Admitting: Dietician

## 2023-03-26 ENCOUNTER — Other Ambulatory Visit: Payer: Self-pay | Admitting: Gynecologic Oncology

## 2023-03-26 ENCOUNTER — Other Ambulatory Visit: Payer: Self-pay

## 2023-03-26 VITALS — Ht 63.0 in | Wt 260.0 lb

## 2023-03-26 DIAGNOSIS — N1831 Chronic kidney disease, stage 3a: Secondary | ICD-10-CM | POA: Diagnosis not present

## 2023-03-26 DIAGNOSIS — Z7901 Long term (current) use of anticoagulants: Secondary | ICD-10-CM | POA: Diagnosis not present

## 2023-03-26 DIAGNOSIS — R3129 Other microscopic hematuria: Secondary | ICD-10-CM

## 2023-03-26 DIAGNOSIS — T462X5A Adverse effect of other antidysrhythmic drugs, initial encounter: Secondary | ICD-10-CM | POA: Diagnosis not present

## 2023-03-26 DIAGNOSIS — Z713 Dietary counseling and surveillance: Secondary | ICD-10-CM | POA: Insufficient documentation

## 2023-03-26 DIAGNOSIS — E064 Drug-induced thyroiditis: Secondary | ICD-10-CM | POA: Diagnosis not present

## 2023-03-26 DIAGNOSIS — N183 Chronic kidney disease, stage 3 unspecified: Secondary | ICD-10-CM | POA: Insufficient documentation

## 2023-03-26 LAB — TSH: TSH: 7.39 u[IU]/mL — ABNORMAL HIGH (ref 0.35–5.50)

## 2023-03-26 LAB — T4, FREE: Free T4: 0.84 ng/dL (ref 0.60–1.60)

## 2023-03-26 LAB — POCT INR: INR: 3.2 — AB (ref 2.0–3.0)

## 2023-03-26 NOTE — Telephone Encounter (Signed)
-----   Message from Doylene Bode sent at 03/26/2023  9:27 AM EDT ----- I talked with a urologist in the OR today about her. They are recommending she have a urology eval. He said the blood in the urine is prob nothing but he would recommend a work up. I spoke with Dr. Cardell Peach about it so if he has any open appts that would be great. Thank you! The referral would be done on the paper fax sheet. They are not on EPIC. She is having hematuria. ----- Message ----- From: Leory Plowman, [facility] [place] Sent: 03/24/2023   9:35 AM EDT To: Doylene Bode, NP

## 2023-03-26 NOTE — Telephone Encounter (Signed)
Spoke with Ms. Laflamme and relayed message from Warner Mccreedy, NP that recommendations are to have a urology consult to evaluate blood in urine. Pt states her PCP's office has put in a referral for her to see a urologist and she hasn't  heard back yet for an appointment.

## 2023-03-26 NOTE — Patient Instructions (Addendum)
Your vitamin  B-12 was in the normal range 1 year ago.  If desired you can take a Vitamin B-12 (sublingual or dissolvable) 3 times per week.  Do take the Vitamin B-12 daily if you are not eating meat.  Consider more plant based options.  Protein with each meal:  beans, tofu, etc.  Eat more Non-Starchy Vegetables  - choose according to your vitamin K balance These include greens, broccoli, cauliflower, cabbage, carrots, beets, eggplant, peppers, squash and others. Minimize added sugars and refined grains Rethink what you drink.  Choose beverages without added sugar.  Look for 0 carbs on the label. See the list of whole grains below.  Find alternatives to usual sweet treats. Choose whole foods over processed. Make simple meals at home more often than eating out.  Tips to increase fiber in your diet: (All plants have fiber.  Eat a variety. There are more than are on this list.) Slowly increase the amount of fiber you eat to 25-35 grams per day.  (More is fine if you tolerate it.) Fiber from whole grains, nuts and seeds Quinoa, 1/2 cup = 5 grams Bulgur, 1/2 cup = 4.1 grams Popcorn, 3 cups = 3.6 grams Whole Wheat Spaghetti, 1/2 cup = 3.2 grams Barley, 1/2 cup = 3 grams Oatmeal, 1/2 cup = 2 grams Whole Wheat English Muffin = 3 grams Corn, 1/2 cup = 2.1 grams Brown Rice, 1/2 cup = 1.8 grams Flax seeds, 1 Tablespoon = 2.8 grams Chia seeds, 1 Tablespoon = 11 grams Almonds, 1 ounce = 3.5 grams fiber Fiber from legumes Kidney beans, 1/2 cup 7.9 grams Lentils, 1/2 cup = 7.8 grams Pinto beans, 1/2 cup = 7.7 grams Black beans, 1/2 cup = 7.6 grams Lima beans, 1/2 cup 6.4 grams Chick peas, 1/2 cup = 5.3 grams Black eyed peas, 1/2 cup = 4 grams Fiber from fruits and vegetables Pear, 6 grams Apple. 3.3 grams Raspberries or Blackberries, 3/4 cup = 6 grams Strawberries or Blueberries, 1 cup = 3.4 grams Baked sweet potato 3.8 grams fiber Baked potato with skin 4.4 grams  Peas, 1/2 cup = 4.4  grams  Spinach, 1/2 cup cooked = 3.5 grams  Avocado, 1/2 = 5 grams

## 2023-03-26 NOTE — Progress Notes (Signed)
0910-  260 lbs per pt   Medical Nutrition Therapy  Appointment Start time:  [phone]  Appointment End time:  1010  Primary concerns today: She is taking Wegovy at 0.5 with no side effects but has not lost weight.  She is seen by [facility] and it is not working to help her lose weight either.  MD from weight loss clinic wants her to learn more about diet for CKD.  She is now having blood in her urine and is to see a urologist.  She states that her thyroid is off.  Referral diagnosis: CHD stage 3 Preferred learning style: no preference indicated Learning readiness: ready, change in progress   NUTRITION ASSESSMENT  63" 260 lbs per patient 03/26/2023 Gained 30 lbs in the past 6-7 months possibly related to thyroid.  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  She does the shopping and cooking.  Meal preps on Sunday.  Avoids added salt, reads labels for sodium.  She followed a vegetarian diet for 1 year (animal reasons) and she lost weight at that time .  She is on disability.  Estimated daily fluid intake: >64 oz per day Supplements: Vitamin D, occasional probiotic Sleep: good 8-10 hours per night Stress / self-care: none Current average weekly physical activity: 3 times per week for 45-60 minutes and was recently told to increase to 5-6 days per week if she can tolerate it (cardiac).  24-Hr Dietary Recall First Meal: Fairlife protein shake, banana  OR 2 eggs, 1 slice diet toast with PB or honey Snack: none Second Meal: Lean Cuisine OR [place], lettuce, tomato, onion sandwich on diet bread, occasional apple, pear, or peach Snack: none Third Meal: salad with ham, [place], cheese with ranch OR chicken breast or salmon or pork, mashed sweet potatoes, green beans or broccoli Snack: skinny pop Beverages: water  Estimated Energy Needs Calories: 1400-1500 Protein: 70g  NUTRITION DIAGNOSIS  NB-1.1 Food and nutrition-related knowledge deficit As related to balance of carbohydrate, protein, and fat.  As evidenced by diet hx and patient report.   NUTRITION INTERVENTION  Nutrition education (E-1) on the following topics:  Discussed vitamin K content of foods related to coumadin use Discussed plant based options as she has done this in the past.  Discussed plant based proteins. Discussed low sodium low fat diet high in vegetables and fruit to help with HTN.    Handouts Provided Include  NKD national kidney diet - Dish up a Kidney-Friendly Meal for Patients with Chronic Kidney Disease (not on dialysis)  Learning Style & Readiness for Change Teaching method utilized: Visual & Auditory  Demonstrated degree of understanding via: Teach Back  Barriers to learning/adherence to lifestyle change: none  Goals Established by Pt Your vitamin  B-12 was in the normal range 1 year ago.  If desired you can take a Vitamin B-12 (sublingual or dissolvable) 3 times per week.  Do take the Vitamin B-12 daily if you are not eating meat.  Consider more plant based options.  Protein with each meal:  beans, tofu, etc.  Eat more Non-Starchy Vegetables  - choose according to your vitamin K balance These include greens, broccoli, cauliflower, cabbage, carrots, beets, eggplant, peppers, squash and others. Minimize added sugars and refined grains Rethink what you drink.  Choose beverages without added sugar.  Look for 0 carbs on the label. See the list of whole  grains below.  Find alternatives to usual sweet treats. Choose whole foods over processed. Make simple meals at home more often than eating out.   MONITORING & EVALUATION Dietary intake, weekly physical activity, and label reading in 2 months.  Next Steps  Patient is to call for questions.

## 2023-03-27 LAB — T3: T3, Total: 81 ng/dL (ref 76–181)

## 2023-03-31 ENCOUNTER — Other Ambulatory Visit (HOSPITAL_COMMUNITY): Payer: Self-pay | Admitting: Cardiology

## 2023-04-02 ENCOUNTER — Ambulatory Visit (HOSPITAL_COMMUNITY): Payer: Self-pay | Admitting: Pharmacist

## 2023-04-02 LAB — POCT INR: INR: 2.7 (ref 2.0–3.0)

## 2023-04-06 ENCOUNTER — Other Ambulatory Visit (HOSPITAL_COMMUNITY): Payer: Self-pay

## 2023-04-09 ENCOUNTER — Telehealth: Payer: Self-pay | Admitting: *Deleted

## 2023-04-09 ENCOUNTER — Ambulatory Visit (HOSPITAL_COMMUNITY): Payer: Self-pay | Admitting: Pharmacist

## 2023-04-09 LAB — POCT INR: INR: 4 — AB (ref 2.0–3.0)

## 2023-04-09 NOTE — Telephone Encounter (Signed)
Spoke with [name] and rescheduled her follow up appt. With Dr.Tucker from March 20th to March 21st at 3:30 pm. Pt agreed to date and time. Pt had no further concerns or questions at this time.

## 2023-04-09 NOTE — Progress Notes (Signed)
LVAD INR 

## 2023-04-16 ENCOUNTER — Ambulatory Visit (HOSPITAL_COMMUNITY): Payer: Self-pay | Admitting: Pharmacist

## 2023-04-16 DIAGNOSIS — I509 Heart failure, unspecified: Secondary | ICD-10-CM | POA: Diagnosis not present

## 2023-04-16 LAB — POCT INR: INR: 3.8 — AB (ref 2.0–3.0)

## 2023-04-20 ENCOUNTER — Ambulatory Visit (INDEPENDENT_AMBULATORY_CARE_PROVIDER_SITE_OTHER): Payer: Medicare Other | Admitting: Internal Medicine

## 2023-04-20 ENCOUNTER — Encounter (INDEPENDENT_AMBULATORY_CARE_PROVIDER_SITE_OTHER): Payer: Self-pay | Admitting: Internal Medicine

## 2023-04-20 VITALS — HR 77 | Temp 98.1°F | Ht 63.0 in | Wt 249.0 lb

## 2023-04-20 DIAGNOSIS — I428 Other cardiomyopathies: Secondary | ICD-10-CM | POA: Diagnosis not present

## 2023-04-20 DIAGNOSIS — Z6841 Body Mass Index (BMI) 40.0 and over, adult: Secondary | ICD-10-CM | POA: Diagnosis not present

## 2023-04-20 DIAGNOSIS — N1831 Chronic kidney disease, stage 3a: Secondary | ICD-10-CM | POA: Diagnosis not present

## 2023-04-20 DIAGNOSIS — E669 Obesity, unspecified: Secondary | ICD-10-CM | POA: Diagnosis not present

## 2023-04-20 NOTE — Assessment & Plan Note (Addendum)
See obesity treatment plan.  She has lost 8 pounds due to changes in nutrition also increases in [place] and physical activity.  Her body composition shows reduction in body fat and preservation of muscle mass.

## 2023-04-20 NOTE — Progress Notes (Signed)
Office: [phone]  /  Fax: [phone]  WEIGHT SUMMARY AND BIOMETRICS  Vitals Temp: 98.1 F (36.7 C) Pulse Rate: 77 SpO2: 99 %   Anthropometric Measurements Height: 5\' 3"  (1.6 m) Weight: 249 lb (112.9 kg) BMI (Calculated): 44.12 Weight at Last Visit: 257 lb Weight Lost Since Last Visit: 8 lb Weight Gained Since Last Visit: 0 lb Starting Weight: 243 lb Total Weight Loss (lbs): 0 lb (0 kg) Peak Weight: 265 lb   Body Composition  Body Fat %: 47.1 % Fat Mass (lbs): 117.4 lbs Muscle Mass (lbs): 125.2 lbs Total Body Water (lbs): 84.2 lbs Visceral Fat Rating : 15    No data recorded Today's Visit #: 13  Starting Date: 04/09/22   HPI  Chief Complaint: OBESITY  [name] is here to discuss her progress with her obesity treatment plan. She is on the following a lower carbohydrate, vegetable and lean protein rich diet plan and states she is following her eating plan approximately 0 % of the time. She states she is exercising 60 minutes 5 times per week.  Interval History:  Since last office visit she has lost 8 pounds.  BIA suggest a reduction in body fat and preservation of muscle mass.  She was seen by registered dietitian and has been incorporating more plant-based nutrition.  I have reviewed consultation. She has been working on reading food labels, not skipping meals, increasing protein intake at every meal, eating more fruits, eating more vegetables, drinking more water, journaling and tracking calories, making healthier choices, reducing portion sizes, and incorporating more whole foods.  Her Reginal Lutes was also increased to 1 mg once a week.  She has noticed an improvement in hunger signals and satiety.  She denies any adverse effects.  She has also begun to exercise.  All of this combined as helped patient initiate weight loss.   Barriers identified: medical comorbidities.   Pharmacotherapy for weight loss: She is currently taking Wegovy with adequate clinical response   and without side effects..    ASSESSMENT AND PLAN  TREATMENT PLAN FOR OBESITY:  Recommended Dietary Goals  [name] is currently in the action stage of change. As such, her goal is to continue weight management plan. She has agreed to: continue current plan  Behavioral Intervention  We discussed the following Behavioral Modification Strategies today: continue to work on maintaining a reduced calorie state, getting the recommended amount of protein, incorporating whole foods, making healthy choices, staying well hydrated and practicing mindfulness when eating..  Additional resources provided today: None and Provided with personal guidance and instructions on how to use [email or web address] for healthy meal ideas and cooking in bulk.  Recommended Physical Activity Goals  [name] has been advised to work up to 150 minutes of moderate intensity aerobic activity a week and strengthening exercises 2-3 times per week for cardiovascular health, weight loss maintenance and preservation of muscle mass.   She has agreed to :  Continue current level of physical activity   Pharmacotherapy We discussed various medication options to help [name] with her weight loss efforts and we both agreed to : continue current anti-obesity medication regimen  ASSOCIATED CONDITIONS ADDRESSED TODAY  Stage 3a chronic kidney disease (HCC) Assessment & Plan: I do not see a follow-up renal panel.  She had a low potassium and is taking supplementation.  We will check a CMP at her next office visit.  Because of chronic kidney disease I do not want her to exceed 70 g of protein per day.  Obesity, Class III, BMI 40-49.9 (morbid obesity) (HCC) Assessment & Plan: See obesity treatment plan.  She has lost 8 pounds due to changes in nutrition also increases in [place] and physical activity.  Her body composition shows reduction in body fat and preservation of muscle mass.   NICM (nonischemic cardiomyopathy) (HCC) Assessment &  Plan: Patient is on LVAD and GBMT.  She has had a decreased GFR since starting treatment so I suspect medication related.  Most recent electrolytes are within normal limits.  She needs to lose weight to qualify for heart transplantation.  At present time there is no signs of decompensated heart failure or overload.  She has begun to exercise and denies any chest pain or shortness of breath.  Continue current regimen     PHYSICAL EXAM:  Pulse 77, temperature 98.1 F (36.7 C), height 5\' 3"  (1.6 m), weight 249 lb (112.9 kg), last menstrual period 03/28/2021, SpO2 99%. Body mass index is 44.11 kg/m.  General: She is overweight, cooperative, alert, well developed, and in no acute distress. PSYCH: Has normal mood, affect and thought process.   HEENT: EOMI, sclerae are anicteric. Lungs: Normal breathing effort, no conversational dyspnea. Extremities: No edema.  Neurologic: No gross sensory or motor deficits. No tremors or fasciculations noted.    DIAGNOSTIC DATA REVIEWED:  BMET    Component Value Date/Time   NA 132 (L) 03/17/2023 0903   NA 138 02/08/2020 0948   NA 138 09/24/2013 0536   K 3.4 (L) 03/17/2023 0903   K 3.7 09/24/2013 0536   CL 94 (L) 03/17/2023 0903   CL 107 09/24/2013 0536   CO2 23 03/17/2023 0903   CO2 25 09/24/2013 0536   GLUCOSE 87 03/17/2023 0903   GLUCOSE 82 09/24/2013 0536   BUN 19 03/17/2023 0903   BUN 18 02/08/2020 0948   BUN 11 09/24/2013 0536   CREATININE 1.34 (H) 03/17/2023 0903   CREATININE 1.08 09/24/2013 0536   CALCIUM 9.0 03/17/2023 0903   CALCIUM 8.6 09/24/2013 0536   GFRNONAA 48 (L) 03/17/2023 0903   GFRNONAA >60 09/24/2013 0536   GFRAA >60 02/23/2020 0903   GFRAA >60 09/24/2013 0536   Lab Results  Component Value Date   HGBA1C 5.5 01/02/2023   HGBA1C 5.9 09/19/2013   Lab Results  Component Value Date   INSULIN 17.8 04/09/2022   INSULIN 22.5 10/05/2017   Lab Results  Component Value Date   TSH 7.39 (H) 03/26/2023   CBC     Component Value Date/Time   WBC 7.5 03/17/2023 0903   RBC 5.86 (H) 03/17/2023 0903   HGB 14.3 03/17/2023 0903   HGB 10.1 (L) 12/25/2020 1132   HCT 45.8 03/17/2023 0903   HCT 33.8 (L) 12/25/2020 1132   PLT 259 03/17/2023 0903   PLT 430 12/25/2020 1132   MCV 78.2 (L) 03/17/2023 0903   MCV 73 (L) 12/25/2020 1132   MCV 85 09/23/2013 0443   MCH 24.4 (L) 03/17/2023 0903   MCHC 31.2 03/17/2023 0903   RDW 21.2 (H) 03/17/2023 0903   RDW 17.0 (H) 12/25/2020 1132   RDW 15.4 (H) 09/23/2013 0443   Iron Studies    Component Value Date/Time   IRON 18 (L) 12/03/2021 1021   TIBC 351 12/03/2021 1021   FERRITIN 17 12/03/2021 1021   IRONPCTSAT 5 (L) 12/03/2021 1021   Lipid Panel     Component Value Date/Time   CHOL 204 (H) 01/02/2023 0926   CHOL 171 04/09/2022 1147   CHOL 183 09/20/2013 0424  TRIG 77.0 01/02/2023 0926   TRIG 105 09/20/2013 0424   HDL 73.30 01/02/2023 0926   HDL 62 04/09/2022 1147   HDL 40 09/20/2013 0424   CHOLHDL 3 01/02/2023 0926   VLDL 15.4 01/02/2023 0926   VLDL 21 09/20/2013 0424   LDLCALC 115 (H) 01/02/2023 0926   LDLCALC 94 04/09/2022 1147   LDLCALC 122 (H) 09/20/2013 0424   Hepatic Function Panel     Component Value Date/Time   PROT 7.8 03/17/2023 0903   PROT 7.8 03/10/2018 1507   PROT 7.7 09/19/2013 0431   ALBUMIN 4.0 03/17/2023 0903   ALBUMIN 4.5 03/10/2018 1507   ALBUMIN 3.5 09/19/2013 0431   AST 29 03/17/2023 0903   AST 36 09/19/2013 0431   ALT 33 03/17/2023 0903   ALT 32 09/19/2013 0431   ALKPHOS 74 03/17/2023 0903   ALKPHOS 65 09/19/2013 0431   BILITOT 0.7 03/17/2023 0903   BILITOT 0.4 03/10/2018 1507   BILITOT 0.4 09/19/2013 0431   BILIDIR 0.2 09/01/2021 0331   IBILI 0.8 09/01/2021 0331      Component Value Date/Time   TSH 7.39 (H) 03/26/2023 0833   Nutritional Lab Results  Component Value Date   VD25OH 18.27 (L) 01/02/2023   VD25OH 19.1 (L) 04/09/2022   VD25OH 30.5 12/01/2019     Return in about 4 weeks (around 05/18/2023)  for For Weight Mangement with Dr. Rikki Spearing.Marland Kitchen She was informed of the importance of frequent follow up visits to maximize her success with intensive lifestyle modifications for her multiple health conditions.   ATTESTASTION STATEMENTS:  Reviewed by clinician on day of visit: allergies, medications, problem list, medical history, surgical history, family history, social history, and previous encounter notes.   I have spent 40 minutes in the care of the patient today including: preparing to see patient (e.g. review and interpretation of tests, old notes ), obtaining and/or reviewing separately obtained history, performing a medically appropriate examination or evaluation, counseling and educating the patient, documenting clinical information in the electronic or other health care record, and independently interpreting results and communicating results to the patient, family, or caregiver   Worthy Rancher, MD

## 2023-04-20 NOTE — Assessment & Plan Note (Addendum)
I do not see a follow-up renal panel.  She had a low potassium and is taking supplementation.  We will check a CMP at her next office visit.  Because of chronic kidney disease I do not want her to exceed 70 g of protein per day.

## 2023-04-20 NOTE — Assessment & Plan Note (Signed)
Patient is on LVAD and GBMT.  She has had a decreased GFR since starting treatment so I suspect medication related.  Most recent electrolytes are within normal limits.  She needs to lose weight to qualify for heart transplantation.  At present time there is no signs of decompensated heart failure or overload.  She has begun to exercise and denies any chest pain or shortness of breath.  Continue current regimen

## 2023-04-21 ENCOUNTER — Ambulatory Visit (HOSPITAL_COMMUNITY): Payer: Self-pay | Admitting: Pharmacist

## 2023-04-21 ENCOUNTER — Encounter (HOSPITAL_COMMUNITY): Payer: Self-pay

## 2023-04-21 ENCOUNTER — Observation Stay (HOSPITAL_BASED_OUTPATIENT_CLINIC_OR_DEPARTMENT_OTHER)
Admission: RE | Admit: 2023-04-21 | Discharge: 2023-04-21 | Disposition: A | Discharge status: Home / Self Care | Payer: Medicare Other | Source: Ambulatory Visit | Attending: Cardiology

## 2023-04-21 ENCOUNTER — Ambulatory Visit (HOSPITAL_COMMUNITY)
Admission: RE | Admit: 2023-04-21 | Discharge: 2023-04-21 | Disposition: A | Discharge status: Home / Self Care | Payer: Medicare Other | Source: Ambulatory Visit | Attending: Cardiology | Admitting: Cardiology

## 2023-04-21 ENCOUNTER — Other Ambulatory Visit (HOSPITAL_COMMUNITY): Payer: Self-pay | Admitting: *Deleted

## 2023-04-21 DIAGNOSIS — Z95811 Presence of heart assist device: Secondary | ICD-10-CM

## 2023-04-21 DIAGNOSIS — I429 Cardiomyopathy, unspecified: Secondary | ICD-10-CM | POA: Diagnosis not present

## 2023-04-21 DIAGNOSIS — I110 Hypertensive heart disease with heart failure: Secondary | ICD-10-CM | POA: Insufficient documentation

## 2023-04-21 DIAGNOSIS — I5022 Chronic systolic (congestive) heart failure: Secondary | ICD-10-CM | POA: Diagnosis not present

## 2023-04-21 DIAGNOSIS — G4730 Sleep apnea, unspecified: Secondary | ICD-10-CM | POA: Insufficient documentation

## 2023-04-21 DIAGNOSIS — Z8673 Personal history of transient ischemic attack (TIA), and cerebral infarction without residual deficits: Secondary | ICD-10-CM | POA: Diagnosis not present

## 2023-04-21 DIAGNOSIS — Z7901 Long term (current) use of anticoagulants: Secondary | ICD-10-CM | POA: Insufficient documentation

## 2023-04-21 DIAGNOSIS — E785 Hyperlipidemia, unspecified: Secondary | ICD-10-CM | POA: Insufficient documentation

## 2023-04-21 DIAGNOSIS — I080 Rheumatic disorders of both mitral and aortic valves: Secondary | ICD-10-CM | POA: Insufficient documentation

## 2023-04-21 LAB — BASIC METABOLIC PANEL
Anion gap: 6 (ref 5–15)
BUN: 11 mg/dL (ref 6–20)
CO2: 24 mmol/L (ref 22–32)
Calcium: 9 mg/dL (ref 8.9–10.3)
Chloride: 106 mmol/L (ref 98–111)
Creatinine, Ser: 1.26 mg/dL — ABNORMAL HIGH (ref 0.44–1.00)
GFR, Estimated: 51 mL/min — ABNORMAL LOW (ref >60)
Glucose, Bld: 79 mg/dL (ref 70–99)
Potassium: 4.5 mmol/L (ref 3.5–5.1)
Sodium: 136 mmol/L (ref 135–145)

## 2023-04-21 LAB — CBC
HCT: 44.4 % (ref 36.0–46.0)
Hemoglobin: 13.4 g/dL (ref 12.0–15.0)
MCH: 23.9 pg — ABNORMAL LOW (ref 26.0–34.0)
MCHC: 30.2 g/dL (ref 30.0–36.0)
MCV: 79.1 fL — ABNORMAL LOW (ref 80.0–100.0)
Platelets: 240 10*3/uL (ref 150–400)
RBC: 5.61 MIL/uL — ABNORMAL HIGH (ref 3.87–5.11)
RDW: 19.7 % — ABNORMAL HIGH (ref 11.5–15.5)
WBC: 7 10*3/uL (ref 4.0–10.5)
nRBC: 0 % (ref 0.0–0.2)

## 2023-04-21 LAB — PROTIME-INR
INR: 2.9 — ABNORMAL HIGH (ref 0.8–1.2)
Prothrombin Time: 30.2 s — ABNORMAL HIGH (ref 11.4–15.2)

## 2023-04-21 LAB — ECHOCARDIOGRAM LIMITED
Est EF: <20
Weight: 4092.8 [oz_av]

## 2023-04-21 LAB — LACTATE DEHYDROGENASE: LDH: 201 U/L — ABNORMAL HIGH (ref 98–192)

## 2023-04-21 NOTE — Progress Notes (Signed)
Echocardiogram 2D Echocardiogram has been performed.  [name] 04/21/2023, 12:18 PM

## 2023-04-21 NOTE — Progress Notes (Signed)
History of Present Illness: [name] is a 52 y.o. 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  She was followed for systolic CHF initially in  [place]. Apparently her EF recovered to 35-40%. However, she was admitted to [facility] 09/19/13 for worsening dyspnea and CP. Coronaries were reportedly OK on LHC at that time at [facility]. Echo showed EF of 15-20% with four-chamber enlargement with moderate-severe TR/MR. RHC as below. Rheumatological w/u negative.  CPX in 9/16 showed near-normal functional capacity.  Repeat echo in 6/17 shows that EF remains 20%. Repeat CPX in 1/18 was submaximal but probably only mild circulatory limitation.  Echo 8/18 showed EF 25-30%, severe LV dilation.  CPX in 6/19 showed low normal functional capacity.  Echo in 10/19 showed EF 20-25%, moderate LV dilation.    Zio patch in 2/20 with 11% PVCs and NSVT, amiodarone started. Zio patch in 3/20 with 13.3% PVCs and NSVT.  She saw Dr. Graciela Husbands, he thought that increased PVCs were electromechanical in nature due to worsening heart failure.  Repeat Zio patch on amiodarone in 1/21 showed 6.2% PVCs.   She was admitted in 4/20 with VT in setting of hypokalemia.    CPX in 9/20 showed mild functional limitation, more due to body habitus than heart failure.  Echo in 4/21 showed EF 20% with severe LV dilation, diffuse hypokinesis, normal RV size and systolic function.   In 8/21, she had a shock for VF in the setting of hypokalemia.   Patient was admitted in 10/21 with CVA involving left MCA territory.  This was thought to be cardioembolic but LV thrombus was not visualized and no atrial fibrillation has been seen.  Echo in 10/21 showed EF 20-25%, severe LV dilation (no LV thrombus seen), mildly decreased RV systolic function, mild-moderate MR. She was taken by IR for thrombectomy and stent placement, now on ASA 81 and ticagrelor.   She  had recurrent VT in 2/22, quinidine was added by EP.  Spironolactone and Coreg were decreased due to low BP and lightheadedness.    Echo in 4/22 showed EF 25% with severe LV dilation and global hypokinesis, normal RV, mild-moderate MR.   CPX (8/22) showed peak VO2 14.3, VE/VCO2 slope 42, RER 1.09 => mild to moderate HF limitation, elevated VE/VCO2 slope is likely false from end exercise hyperventilation. Restrictive lung physiology from obesity.   Patient was admitted in 3/23 with recurrent CHF exacerbation with low cardiac output.  She was also noted to be hyperthyroidism but amiodarone had to be continued due to VT and poorly tolerated atrial fibrillation.  She required Impella 5.5 placement and eventual HM3 LVAD placement on 09/04/21.  Post-op course complicated by atrial fibrillation, malnutrition, and deconditioning.  She went to CIR prior to discharge.   Patient developed heavy vaginal bleeding in 7/23, she was seen in the ER and required 2 units PRBCs.  She was admitted in 8/23 and had hysterectomy + BSO, LVAD speed increased to 5900 rpm on ramp echo.  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    The patient was admitted overnight in 12/23 with severe posterior epistaxis requiring nasal pacing.   In 2/24, she had VF with shock.  Amiodarone was restarted.   Echo in 7/24 showed  EF < 20, mild RV dysfunction with normal size, interventricular septum shifted mildly to right.   At a previous visit in  12/2022, her VAD speed was increased to 6000. She took a torsemide a day before presenting to clinic in 02/2023 and noted an increased frequency of PI events despite drinking water. Her speed was reduced to 5900 RPM and she states that she improved.   She returns with similar symptoms today, but with frequent PI events as well as fatigue, no recent diuretic use in the last month.   LVAD interrogation: Please see LVAD nursing note above.   Labs: (11/09/13): Dig  level 0.5, K 3.6, Creatinine 0.83, pro-BNP 622 (12/19/13): K 3.6, creatinine 0.8 (7/15): K 4.1, creatinine 0.91 (8/15): K 3.9, creatinine 0.91 (10/15): K 4, creatinine 0.93, proBNP 129 (12/15): K 4, creatinine 0.9, digoxin 0.4 (5/16): K 4, creatinine 0.86 (9/16): K 3.6, creatinine 0.86, digoxin < 0.2 (5/17): K 4.1, creatinine 0.95, digoxin 0.3 (5/18): K 3.4, creatinine 0.95, TSH elevated but free T3 and free T4 normal.  (8/18): K 3.9, creatinine 0.87, BNP 108  (11/18): K 3.5, creatinine 1.04, digoxin 0.4 (1/19): LDL 91, HDL 47 (3/19): digoxin 0.7, K 4, creatinine 0.99 (4/19): TSH normal, LDL 95 (9/19): LDL 99, HDL 45, K 4, creatinine 0.96 (10/19): K 3.6, creatinine 0.85, digoxin 0.5 (2/20): LDL 103 (3/20): K 3.9, creatinine 1.05 (4/20): K 3.9, creatinine 1.23 => 1.15, TSH 8, digoxin 0.3, LFTs normal 6/20): K 4.3, creatinine 1.07, LFTs normal, digoxin 0.5 (7/20): K 3.8, creatinine 1.16 (8/20): digoxin < 0.2, LDL 135 (1/21): K 3.9, creatinine 1.05, LDL 171  (4/21): K 4.2, creatinine 0.95, LDL 127, TSH normal, LFTs normal (10/21): K 2.8, creatinine 1.21, LDL 130 (11/21): K 4.5, creatinine 1.3 => 1.2, LFTs normal, digoxin 1.3, TSH normal (3/22): K 4.1, creatinine 1.36 (4/22): hgb 10.8 (7/22): Transferrin 8%, digoxin 0.9, hgb 10.1, K 3.9, creatinine 1.42 (8/22): digoxin 0.4 (9/22): K 4.2, creatinine 1.32 (4/23): hgb 11, WBCs 13, K 3.9 => 3.8, creatinine 0.9 => 0.98, albumin 3.2, ALT 45, AST 31, LDH 272 => 244, TSH 0.235, free T4 1.84 (7/23): hgb 8, TSH normal 8/23): K 3.8, creatinine 1.15, hgb 11.4 (9/23): K 3.7, creatinine 1.36 (10/23): K 3.6, creatinine 1.42 (11/23): TSH normal (1/24): K 3.9, creatinine 1.17 (2/24): TSH 4.75, hgb 11.3, K 3.4, creatinine 1.35, LFTs normal (3/24): LFTs normal (5/24): K 4, creatinine 1.4, hgb 12.5, LDH 193 (7/24): K 4, creatinine 1.3, LFTs normal, hgb 12.3, LDL 115, LDH 221  PMH: 1. OSA: Using CPAP.  2. PVCs:  - Zio patch 2/20: 11% PVCs - Zio  patch 3/20: 13.3% PVCs - Zio patch 1/21: 6.2% PVCs 3. VT/VF: 8/21, in setting of hypokalemia.  2/22 VT, quinidine added.  - VT 2/24, restarted amiodarone 4. 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  No LV thrombus visualized and atrial fibrillation has not been visualized.  - IR thrombectomy with stent placement in 10/21.  5. Chronic systolic CHF: Nonischemic cardiomyopathy:   - [facility]: Normal coronaries.  - Echo (8/15) with EF 20%, moderate LV dilation, diffuse hypokinesis, normal RV size with mildly decreased systolic function, mild MR.  - Echo (5/16) with EF 20%, spherical LV with diffuse hypokinesis, mild MR.  - Echo (6/17) with EF 20%, diffuse hypokinesis, moderate LV dilation, normal RV, mild to moderate MR.  - RHC (12/17): mean RA 5, PA 56/31, mean PCWP 24, CI 2, PVR 3.7 WU - Echo (8/18) with EF 25-30%, severe LV dilation, severe LAE.  - Echo (2/20) with EF 20-25%, moderate LV dilation, moderate MR.  - Echo (4/21) with EF 20-25%, severe LV  dilation, mild-moderate MR, normal RV, PASP 26 mmHg.  - Echo (10/21) with EF 20-25%, severe LV dilation (no LV thrombus seen), mildly decreased RV systolic function, mild-moderate MR. - CPX in 9/20 showed mild functional limitation, more due to body habitus than heart failure.  - CPX in 8/21: Peak VO2 11.6, VE/VCO2 32, RER 1.2.  Mild HF limitation.  - Echo (4/22): EF 25% with severe LV dilation and global hypokinesis, normal RV, mild-moderate MR.  - CPX (8/22): peak VO2 14.3, VE/VCO2 slope 42, RER 1.09 => mild to moderate HF limitation, elevated VE/VCO2 slope is likely false from end exercise hyperventilation. Restrictive lung physiology from obesity.  - HM3 LVAD placement 09/04/21.  - Echo (7/24): EF < 20, mild RV dysfunction with normal size, interventricular septum shifted mildly to right. 6. PFTs (1/22): Near normal.  7. Fe deficiency anemia 8. 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   10. Left ovarian mass -  hysterectomy + BSO.  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      Current Outpatient Medications  Medication Sig Dispense Refill   amiodarone (PACERONE) 200 MG tablet Take 1 tablet (200 mg total) by mouth daily. 90 tablet 3   ASPIRIN LOW DOSE 81 MG tablet TAKE ONE TABLET BY MOUTH ONCE A DAY 30 tablet 11   dapagliflozin propanediol (FARXIGA) 10 MG TABS tablet Take 1 tablet (10 mg total) by mouth daily before breakfast. 90 tablet 3   estradiol (CLIMARA - DOSED IN MG/24 HR) 0.0375 mg/24hr patch Place 1 patch (0.0375 mg total) onto the skin every 7 (seven) days. 4 patch 11   quiniDINE gluconate 324 MG CR tablet Take 1 tablet (324 mg total) by mouth 2 (two) times daily. 60 tablet 6   rosuvastatin (CRESTOR) 40 MG tablet Take 1 tablet (40 mg total) by mouth daily. 90 tablet 3   Semaglutide-Weight Management (WEGOVY) 1 MG/0.5ML SOAJ Inject 1 mg into the skin once a week. 2 mL 0   sildenafil (REVATIO) 20 MG tablet Take 1 tablet (20 mg total) by mouth 3 (three) times daily. 90 tablet 6   spironolactone (ALDACTONE) 25 MG tablet Take 1 tablet (25 mg total) by mouth daily. Restart on 02/10/22. 45 tablet 3   Vitamin D, Ergocalciferol, (DRISDOL) 1.25 MG (50000 UNIT) CAPS capsule Take 1 capsule (50,000 Units total) by mouth every 7 (seven) days. 13 capsule 1   warfarin (COUMADIN) 2 MG tablet Take 4 mg (2 tabs) daily or as directed by LVAD Clinic. (Patient taking differently: Take 2 mg by mouth daily at 4 PM. Take 4 mg (2 tabs) daily except Tues/Thursday take 2 mg or as directed by LVAD Clinic.) 180 tablet 3   ALPRAZolam (XANAX) 0.25 MG tablet Take 1 tablet (0.25 mg total) by mouth 2 (two) times daily as needed for anxiety. 20 tablet 2   potassium chloride SA (KLOR-CON M) 20 MEQ tablet Take 1 tablet (20 mEq total) by mouth daily. 270 tablet 3   torsemide (DEMADEX) 20 MG tablet Take 1 tablet (20 mg total) by mouth every other day. Take 1 tablet (20 mg)  by mouth as needed, or as directed by heart failure clinic. 180 tablet 3   No current facility-administered medications for this encounter.    Allergies:   Patient has no known allergies.   Social History:  The patient  reports that she has never smoked. She has never used smokeless tobacco. She reports that she does not  drink alcohol and does not use drugs.   Family History:  The patient's family history includes Heart Problems in her mother; Heart disease in her maternal grandmother and mother; High blood pressure in her father; Hypertension in her father and mother; Obesity in her mother; Stroke in her father.   ROS:  Please see the history of present illness.   All other systems are personally reviewed and negative.   Exam:    MAP 68 General: Well appearing this am. NAD.  HEENT: Normal. Neck: Supple, JVP 5-6 cm. Carotids OK.  Cardiac:  Mechanical heart sounds with LVAD hum present.  Lungs:  CTAB, normal effort.  Abdomen:  NT, ND, no HSM. No bruits or masses. +BS  LVAD exit site: Well-healed and incorporated. Dressing dry and intact. No erythema or drainage. Stabilization device present and accurately applied. Driveline dressing changed daily per sterile technique. Extremities:  Warm and dry. No cyanosis, clubbing, rash, or edema.  Neuro:  Alert & oriented x 3. Cranial nerves grossly intact. Moves all 4 extremities w/o difficulty. Affect pleasant    Recent Labs: 03/17/2023: ALT 33; Magnesium 1.8 03/26/2023: TSH 7.39 04/21/2023: BUN 11; Creatinine, Ser 1.26; Hemoglobin 13.4; Platelets 240; Potassium 4.5; Sodium 136  Personally reviewed   Wt Readings from Last 3 Encounters:  04/22/23 115.7 kg (255 lb)  04/21/23 116 kg (255 lb 12.8 oz)  04/20/23 112.9 kg (249 lb)    ASSESSMENT AND PLAN:   1. Chronic systolic HF: Nonischemic cardiomyopathy.  She has a Medtronic ICD. Cause for cardiomyopathy is uncertain, initially identified peri-partum (after son's birth), so cannot rule out  peri-partum CMP.  On and off, she has had frequent PVCs.   RHC in 8/15 showed normal filling pressures and preserved cardiac output. Echo in 4/22 with EF 25%, severe LV dilation.  CPX (8/22) with mild-moderate HF limitation.  Echo in 3/23 with EF < 20% with severe dilation, normal RV size with mildly decreased systolic function, moderate-severe functional MR with dilated IVC. 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 Impella 5.5 placed 08/29/21.  Patient had HM3 LVAD placed 09/04/21 and speed adjusted by ramp echo up to 5800 rpm and again up to 5900 rpm. In 7/24, LVAD speed increased to 6000 rpm under echo guidance. Continued PI and suction events. Echo reviewed and shows mild, continuous AI and no aortic valve opening. Speed turned down to 5700 with minimal change in flow to help encourage aortic valve opening. Will return to clinic for repeat labs and assess response.  - With suction events, decrease speed to 5700 rpm.  - She can continue to use torsemide prn.   - Continue Farxiga 10 mg daily.  - Continue spironolactone 25 mg daily.   - She should continue ASA 81 mg daily with history of CVA.  - Continue sildenafil 20 mg tid  - Warfarin for VAD, goal INR 2-2.5.   2. 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  She had been on Levoxyl at home prior to 3/23 admission, noted to be hyperthyroid in 3/23.  This likely contributes to her CHF. She had recurrent VT and poorly tolerated AF which made it difficult for [name] to stop amiodarone. Initially treated w/ IV Solumedrol -> transitioned to prednisone and methimazole.  Endocrinology following, prednisone has been titrated off, now titrated off methimazole.  We had to restart her on amiodarone with  VF.  - Continue amiodarone 200 daily, check LFTs and will need regular eye exam.  3.  PVCs/VT: EP consulted given complicated situation with hyperthyroidism and VT. She had a VF episode and amiodarone was restarted.  - Continue quinidine.  - Continue amiodarone, follow TSH closely.  4. CVA: CVA in 10/21, had thrombectomy with stent placement.  No LV thrombus noted and no atrial fibrillation noted.  - Would continue ASA 81 daily.   5. Anemia: CBC today.  6. CKD stage 3: Follow creatinine closely.  7. Mitral regurgitation: Moderate-severe functional MR pre-LVAD.  Unlikely to be Mitraclip candidate with severely dilated LV (>7 cm). Mitral regurgitation was mild on 7/24 echo.  8. Left ovarian mass: Hysterectomy + BSO.  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   9. Atrial fibrillation: Tolerates poorly.  NSR today.  10. Epistaxis: No recurrence.   Followup in 1 week in VAD clinic  Romie Minus, MD  04/22/2023  [facility] [address] [facility] [place] [address] [phone] (office) [phone] (fax)

## 2023-04-21 NOTE — Patient Instructions (Signed)
No medication changes We decreased your speed to 5700 today based on your RAMP echo Please call VAD coordinators if you are not feeling better by Friday Return to VAD clinic in 1 week for a nurse visit

## 2023-04-21 NOTE — Progress Notes (Signed)
Patient presented for a sick visit in VAD Clinic today with her husband Marijean Niemann. Reports no problems with VAD equipment or concerns with drive line.  Pt called VAD clinic this morning and reported feeling poorly over the last few days with lightheadedness, fatigue, and noted speed drops/elevated PI. Advised pt to come to clinic for assessment.  Pt ambulated into clinic unassisted. Denies sick contact, dizziness, falls, shortness of breath, and signs of bleeding. Endorses 4 days of intermittent lightheaded episodes and fatigue. Reports at times she feels the need to take a deep breath because "I feel like I am having PVCs." (Occasional PVCs noted on the monitor.) She has been drinking at least 2 L daily. She has not taken any Torsemide in a month. States that her speed is ramping down to low speed limit, and at times her PI is >10. VAD speed decreased to 5900 (from 6000) last visit due to dizziness. Orthostatic BP check as documented below.   RAMP echo completed in clinic today per Dr Elwyn Lade. Speed decreased to 5700 today. Low speed 5200. Advised pt to notify VAD coordinators on Friday if she is still not feeling better. She verbalized understanding. Will have pt return for nurse visit next week to assess tolerance of decreased speed.    Pt is participating in healthy weight and wellness program and has been able to increase her exercise routine without any limitation. Currently on 1.0 mg of Wegovy. Appetite has decreased since starting [facility]. Weight down 7 lbs since last visit.   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 She has not taken Digoxin since 2023.    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 Pt had follow up with Endocrinology 01/28/23 where Methimazole was discontinued. Thyroid labs checked 10/3. Next visit 06/02/23.   Vital Signs:  Doppler Pressure: UTO  Automatic BP: 98/75 (84) - laying   115/86 (97)-  sitting   113/66 (78)- standing  HR: 74 SR with occasional PVCs SPO2: UTO   Weight: 255.8 lbs w/o eqt Last weight: 263.8 lb w/o eqt  VAD Indication: Destination Therapy d/t BMI   VAD interrogation & Equipment Management: Speed: 6000 Flow: 5.5 Power: 5.1 w    PI: 4.4   Alarms: none Events: >100  Fixed speed 5900 >> 5700 Low speed limit: 5600 >> 5200   Primary Controller:  Replace back up battery in 12 months. Back up controller:   Replace back up battery in 21 months.    Annual Equipment Maintenance on UBC/PM was performed on 09/08/22.    I reviewed the LVAD parameters from today and compared the results to the patient's prior recorded data. LVAD interrogation was NEGATIVE for significant power changes, NEGATIVE for clinical alarms and STABLE for PI events/speed drops. No programming changes were made and pump is functioning within specified parameters. Pt is performing daily controller and system monitor self tests along with completing weekly and monthly maintenance for LVAD equipment.   LVAD equipment check completed and is in good working order. Back-up equipment present. Charged back up battery and performed self-test on equipment.    Exit Site Care: CDI. Drive line anchor secure. Pt denies fever or chills. Patient has adequate dressing supplies at home.     Significant Events on VAD Support:  N/A  Device: Medtronic Therapies: on VF 240 VT 200 VVI: 40 Last check: 02/05/2023   BP & Labs:  Doppler- unable to obtain as today's visit after multiple attempts  Hgb 13.4 - No S/S of bleeding. Specifically denies vaginal bleeding, melena/BRBPR or nosebleeds.    LDH stable at pending with established baseline of 250 - 450. Denies tea-colored urine. No power elevations noted on interrogation.   Plan:  No medication changes We decreased your speed to 5700 today based on your RAMP echo Please call VAD coordinators if you are not feeling better by Friday Return to VAD  clinic in 1 week for a nurse visit  Alyce Pagan RN VAD Coordinator  Office: [phone]  24/7 Pager: [phone]

## 2023-04-21 NOTE — Progress Notes (Signed)
Speed  Flow  PI  Power  LVIDD  AI  Aortic opening MR  TR  Septum  RV  VTI (>18cm)  5900 5.3 5.0 5.0 7.0 mild 0/5          5800  5.1 4.8 4.9  mild 0/5       5700  4.9 6.3 4.8  mild                                                   Doppler MAP:  Auto cuff BP: 98/75 (84)   Ramp ECHO performed at bedside per Dr Elwyn Lade. Images were difficult to obtain.   At completion of ramp study, patients primary controller programmed:   Fixed speed: 5700 Low speed limit: 5200   Alyce Pagan RN VAD Coordinator  Office: [phone]  24/7 Pager: [phone]

## 2023-04-22 ENCOUNTER — Ambulatory Visit: Payer: Medicare Other | Admitting: Urology

## 2023-04-22 ENCOUNTER — Encounter: Payer: Self-pay | Admitting: Urology

## 2023-04-22 VITALS — BP 98/75 | HR 74 | Ht 63.0 in | Wt 255.0 lb

## 2023-04-22 DIAGNOSIS — R3129 Other microscopic hematuria: Secondary | ICD-10-CM | POA: Diagnosis not present

## 2023-04-22 NOTE — Patient Instructions (Signed)

## 2023-04-22 NOTE — Progress Notes (Signed)
04/22/23 9:56 AM   [name] 01-Jul-1970 [id number]  CC: Asymptomatic microscopic hematuria  HPI: 52 year old female referred for microscopic hematuria with 6-10 RBCs seen on recent urinalysis.  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  CT abdomen and pelvis with contrast from September 2023 for history of ovarian cancer was benign aside from a 4 mm nonobstructive lower pole stone.  She denies any gross hematuria, dysuria, or other urinary symptoms aside from baseline frequency secondary to diuretics.  She is a non-smoker.   PMH: Past Medical History:  Diagnosis Date   Anemia    Automatic implantable cardioverter-defibrillator in situ    B12 deficiency    Cancer (HCC)    Chronic CHF (congestive heart failure) (HCC)    a. EF 15-20% b. RHC (09/2013) RA 14, RV 57/22, PA 64/36 (48), PCWP 18, FIck CO/CI 3.7/1.6, PVR 8.1 WU, PA sat 47%    Chronic kidney disease    Heart valve problem    History of heart attack    History of hypothyroidism    History of stomach ulcers    HLD (hyperlipidemia)    Hyperthyroidism    Hypotension    Hypothyroidism    Morbid obesity (HCC)    Myocardial infarction (HCC) 08/2013   Nocturnal dyspnea    Nonischemic cardiomyopathy (HCC)    Palpitations    Sinus tachycardia    Sleep apnea    Snoring-prob OSA 09/04/2011   SOB (shortness of breath)    Sprint Fidelis ICD lead RECALL  6949    Stroke (HCC) 2021   some residual right-sided weakness   UARS (upper airway resistance syndrome) 09/04/2011   HST 12/2013:  AHI 4/hr (numerous episodes of airflow reduction that did not have concomitant desaturation)    Vitamin D deficiency     Surgical History: Past Surgical History:  Procedure Laterality Date   BREATH TEK H PYLORI N/A 11/09/2014   Procedure: BREATH TEK H PYLORI;  Surgeon: Gaynelle Adu, MD;  Location: [facility];  Service: General;  Laterality: N/A;    CARDIAC CATHETERIZATION  ~ 2006; 09/2013   CARDIAC CATHETERIZATION N/A 05/23/2016   Procedure: Right Heart Cath;  Surgeon: Laurey Morale, MD;  Location: [facility] INVASIVE CV LAB;  Service: Cardiovascular;  Laterality: N/A;   CARDIAC DEFIBRILLATOR PLACEMENT  2006; 12/26/2013   Medtronic [id number]; 12-2013 ICD gen change and RV lead revision with new 6935 RV lead by Dr Graciela Husbands   CENTRAL LINE INSERTION  08/28/2021   Procedure: CENTRAL LINE INSERTION;  Surgeon: Laurey Morale, MD;  Location: [facility] INVASIVE CV LAB;  Service: Cardiovascular;;   CESAREAN SECTION  06/23/1997   IMPLANTABLE CARDIOVERTER DEFIBRILLATOR GENERATOR CHANGE N/A 12/26/2013   Procedure: IMPLANTABLE CARDIOVERTER DEFIBRILLATOR GENERATOR CHANGE;  Surgeon: Duke Salvia, MD;  Location[facility] CATH LAB;  Service: Cardiovascular;  Laterality: N/A;   INSERTION OF IMPLANTABLE LEFT VENTRICULAR ASSIST DEVICE N/A 09/03/2021   Procedure: INSERTION OF IMPLANTABLE LEFT VENTRICULAR ASSIST DEVICE/ Heartmate 3;  Surgeon: Lovett Sox, MD;  Location: [facility] OR;  Service: Open Heart Surgery;  Laterality: N/A;   IR CT HEAD LTD  04/17/2020   IR CT HEAD LTD  04/17/2020   IR INTRA CRAN STENT  04/17/2020   IR PERCUTANEOUS ART THROMBECTOMY/INFUSION INTRACRANIAL INC DIAG ANGIO  04/17/2020   IR RADIOLOGIST EVAL & MGMT  06/05/2020   LEAD REVISION N/A 12/26/2013   Procedure: LEAD REVISION;  Surgeon: Viviann Spare  Anabel Halon, MD;  Location: [facility] CATH LAB;  Service: Cardiovascular;  Laterality: N/A;   LEFT VENTRICULAR ASSIST DEVICE     PLACEMENT OF IMPELLA LEFT VENTRICULAR ASSIST DEVICE Right 08/29/2021   Procedure: PLACEMENT OF IMPELLA 5.5 LEFT VENTRICULAR ASSIST DEVICE;  Surgeon: Alleen Borne, MD;  Location: [facility] OR;  Service: Open Heart Surgery;  Laterality: Right;   RADIOLOGY WITH ANESTHESIA N/A 04/17/2020   Procedure: Code Stroke;  Surgeon: Julieanne Cotton, MD;  Location: [facility] OR;  Service: Radiology;  Laterality: N/A;   REMOVAL OF IMPELLA LEFT VENTRICULAR ASSIST DEVICE  N/A 09/03/2021   Procedure: REMOVAL OF IMPELLA LEFT VENTRICULAR ASSIST DEVICE;  Surgeon: Lovett Sox, MD;  Location: [facility] OR;  Service: Open Heart Surgery;  Laterality: N/A;   RIGHT HEART CATH N/A 08/28/2021   Procedure: RIGHT HEART CATH;  Surgeon: Laurey Morale, MD;  Location[facility] INVASIVE CV LAB;  Service: Cardiovascular;  Laterality: N/A;   RIGHT HEART CATHETERIZATION N/A 02/15/2014   Procedure: RIGHT HEART CATH;  Surgeon: Laurey Morale, MD;  Location: [facility] CATH LAB;  Service: Cardiovascular;  Laterality: N/A;   ROBOTIC ASSISTED TOTAL HYSTERECTOMY WITH BILATERAL SALPINGO OOPHERECTOMY N/A 02/05/2022   Procedure: XI ROBOTIC ASSISTED TOTAL HYSTERECTOMY WITH BILATERAL SALPINGO OOPHORECTOMY, CYSTOSCOPY, UTERUS GREATER THAN 250 GRAMS;  Surgeon: Carver Fila, MD;  Location: [facility] OR;  Service: Gynecology;  Laterality: N/A;   TEE WITHOUT CARDIOVERSION N/A 08/29/2021   Procedure: TRANSESOPHAGEAL ECHOCARDIOGRAM (TEE);  Surgeon: Alleen Borne, MD;  Location: [facility] OR;  Service: Open Heart Surgery;  Laterality: N/A;   TEE WITHOUT CARDIOVERSION N/A 09/03/2021   Procedure: TRANSESOPHAGEAL ECHOCARDIOGRAM (TEE);  Surgeon: Lovett Sox, MD;  Location: [facility] OR;  Service: Open Heart Surgery;  Laterality: N/A;   TUBAL LIGATION  06/23/1997     Family History: Family History  Problem Relation Age of Onset   Hypertension Mother    Heart disease Mother    Obesity Mother    Heart Problems Mother    Hypertension Father    High blood pressure Father    Stroke Father    Heart disease Maternal Grandmother    Colon cancer Neg Hx    Breast cancer Neg Hx    Ovarian cancer Neg Hx    Endometrial cancer Neg Hx    Pancreatic cancer Neg Hx    Prostate cancer Neg Hx    Liver disease Neg Hx    Cancer - Colon Neg Hx     Social History:  reports that she has never smoked. She has never used smokeless tobacco. She reports that she does not drink alcohol and does not use drugs.  Physical Exam: BP 98/75 (BP  Location: Left Arm, Patient Position: Sitting, Cuff Size: Large)   Pulse 74   Ht 5\' 3"  (1.6 m)   Wt 255 lb (115.7 kg)   LMP 03/28/2021 (Approximate)   BMI 45.17 kg/m    Constitutional:  Alert and oriented, No acute distress. Cardiovascular: No clubbing, cyanosis, or edema. Respiratory: Normal respiratory effort, no increased work of breathing. GI: Abdomen is soft, nontender, nondistended, no abdominal masses   Laboratory Data: Reviewed, see HPI  Pertinent Imaging: I have personally viewed and interpreted the CT scan from September 2023 showing a 4 mm nonobstructive right lower pole stone, no other urologic abnormalities.  Assessment & Plan:   52 year old female with a number of comorbidities and microscopic hematuria with 6-10 RBC seen on recent urinalysis.  Outside notes reviewed at length.  We discussed common possible etiologies of  microscopic hematuria including idiopathic, urolithiasis, medical renal disease, and malignancy. We discussed the new asymptomatic microscopic hematuria guidelines and risk categories of low, intermediate, and high risk that are based on age, risk factors like smoking, and degree of microscopic hematuria. We discussed work-up can range from repeat urinalysis, renal ultrasound and cystoscopy, to CT urogram and cystoscopy.  They fall into the intermediate risk category, and I recommended proceeding with renal ultrasound and considering cystoscopy.  She is unsure if she would like to pursue cystoscopy and would like to start with a renal ultrasound which I think is reasonable, especially in the setting of her other numerous comorbidities.  Will call with renal ultrasound results, if she opts to pursue cystoscopy okay to schedule.   Legrand Rams, MD 04/22/2023  [facility] [address] [place], [place] [address] [phone]

## 2023-04-24 ENCOUNTER — Other Ambulatory Visit: Payer: Self-pay

## 2023-04-24 ENCOUNTER — Other Ambulatory Visit: Payer: Self-pay | Admitting: Nurse Practitioner

## 2023-04-24 ENCOUNTER — Other Ambulatory Visit (HOSPITAL_COMMUNITY): Payer: Self-pay | Admitting: Cardiology

## 2023-04-24 DIAGNOSIS — Z95811 Presence of heart assist device: Secondary | ICD-10-CM

## 2023-04-24 DIAGNOSIS — I5022 Chronic systolic (congestive) heart failure: Secondary | ICD-10-CM

## 2023-04-24 DIAGNOSIS — E88819 Insulin resistance, unspecified: Secondary | ICD-10-CM

## 2023-04-24 DIAGNOSIS — I428 Other cardiomyopathies: Secondary | ICD-10-CM

## 2023-04-26 ENCOUNTER — Other Ambulatory Visit: Payer: Self-pay

## 2023-04-27 ENCOUNTER — Other Ambulatory Visit: Payer: Self-pay

## 2023-04-28 ENCOUNTER — Other Ambulatory Visit: Payer: Self-pay

## 2023-04-28 ENCOUNTER — Other Ambulatory Visit (HOSPITAL_COMMUNITY): Payer: Self-pay

## 2023-04-28 ENCOUNTER — Ambulatory Visit (HOSPITAL_COMMUNITY)
Admission: RE | Admit: 2023-04-28 | Discharge: 2023-04-28 | Disposition: A | Discharge status: Home / Self Care | Payer: Medicare Other | Source: Ambulatory Visit | Attending: Internal Medicine | Admitting: Internal Medicine

## 2023-04-28 DIAGNOSIS — I5022 Chronic systolic (congestive) heart failure: Secondary | ICD-10-CM | POA: Insufficient documentation

## 2023-04-28 DIAGNOSIS — Z95811 Presence of heart assist device: Secondary | ICD-10-CM | POA: Diagnosis not present

## 2023-04-28 MED ORDER — SPIRONOLACTONE 25 MG PO TABS
25.0000 mg | ORAL_TABLET | Freq: Every day | ORAL | 3 refills | Status: DC
  Filled 2023-04-28: qty 45, 45d supply, fill #0
  Filled 2023-07-03: qty 45, 45d supply, fill #1
  Filled 2023-08-25: qty 45, 45d supply, fill #2
  Filled 2023-09-25 – 2023-10-26 (×2): qty 45, 45d supply, fill #3

## 2023-04-28 MED ORDER — WEGOVY 1.7 MG/0.75ML ~~LOC~~ SOAJ
1.7000 mg | SUBCUTANEOUS | 1 refills | Status: DC
  Filled 2023-04-28: qty 3, fill #0
  Filled 2023-06-20: qty 3, 28d supply, fill #0

## 2023-04-28 MED FILL — Semaglutide (Weight Mngmt) Soln Auto-Injector 1 MG/0.5ML: SUBCUTANEOUS | 28 days supply | Qty: 2 | Fill #0 | Status: AC

## 2023-04-28 NOTE — Progress Notes (Addendum)
Patient presented for a nurse visit in [facility] Clinic today alone to assess tolerance of decreased speed. Reports no problems with VAD equipment or concerns with drive line.  Pt seen in VAD clinic last week with complaints of feeling poorly for 4 days with lightheadedness, fatigue, and noted speed drops/elevated PI. RAMP echo completed in clinic Dr Elwyn Lade. Speed decreased to 5700 today. Low speed 5400. Advised pt to notify VAD coordinators on Friday if she is still not feeling better.    Pt endorses feeling much better after speed change last week. Denies lightheadedness, dizziness, falls, shortness of breath, and signs of bleeding. States she has increased energy this week. Pt continues to have >100 PI events daily. Discussed with Dr. Shirlee Latch. Will continue current plan as pt is asymptomatic at this time and add a RAMP echo on for her next full visit. Pt encouraged to continue to ensure adequate hydration. Pt instructed to call VAD Clinic if symptoms reoccur.  Vital Signs:  Doppler Pressure: UTO  Automatic BP: 116/59 (76)  VAD Indication: Destination Therapy d/t BMI   VAD interrogation & Equipment Management: Speed: 5700 Flow: 5.0 Power: 4.7 w    PI: 4.9   Alarms: none Events: >100  Fixed speed 5700 Low speed limit: 5400   Primary Controller:  Replace back up battery in 12 months. Back up controller:   Replace back up battery in 21 months.    Annual Equipment Maintenance on UBC/PM was performed on 09/08/22.    I reviewed the LVAD parameters from today and compared the results to the patient's prior recorded data. LVAD interrogation was NEGATIVE for significant power changes, NEGATIVE for clinical alarms and STABLE for PI events/speed drops. No programming changes were made and pump is functioning within specified parameters. Pt is performing daily controller and system monitor self tests along with completing weekly and monthly maintenance for LVAD equipment.   LVAD equipment check  completed and is in good working order. Back-up equipment present. Charged back up battery and performed self-test on equipment.   Exit Site Care: CDI. Drive line anchor secure. Pt denies fever or chills. Pt given 8 cath grip anchors for home use today.   Significant Events on VAD Support:  N/A  Device: Medtronic Therapies: on VF 240 VT 200 VVI: 40 Last check: 02/05/2023  Plan:  Please call VAD coordinators if symptoms reoccur Return to VAD clinic in 05/26/23 for a full visit with Dr. Shirlee Latch with RAMP echo  [name],BSN VAD Coordinator  Office: [phone]  24/7 Pager: [phone]

## 2023-04-29 ENCOUNTER — Ambulatory Visit
Admission: RE | Admit: 2023-04-29 | Discharge: 2023-04-29 | Disposition: A | Discharge status: Home / Self Care | Payer: Medicare Other | Source: Ambulatory Visit | Attending: Urology | Admitting: Urology

## 2023-04-29 DIAGNOSIS — R3129 Other microscopic hematuria: Secondary | ICD-10-CM | POA: Insufficient documentation

## 2023-04-30 ENCOUNTER — Ambulatory Visit (HOSPITAL_COMMUNITY): Payer: Self-pay | Admitting: Pharmacist

## 2023-04-30 LAB — POCT INR: INR: 3 (ref 2.0–3.0)

## 2023-05-07 ENCOUNTER — Ambulatory Visit (INDEPENDENT_AMBULATORY_CARE_PROVIDER_SITE_OTHER): Payer: Medicare Other

## 2023-05-07 DIAGNOSIS — I428 Other cardiomyopathies: Secondary | ICD-10-CM | POA: Diagnosis not present

## 2023-05-07 LAB — CUP PACEART REMOTE DEVICE CHECK
Battery Remaining Longevity: 22 mo
Battery Voltage: 2.93 V
Brady Statistic RV Percent Paced: 0.01 %
Date Time Interrogation Session: 20241114023324
HighPow Impedance: 77 Ohm
Implantable Lead Connection Status: 753985
Implantable Lead Implant Date: 20150706
Implantable Lead Location: 753860
Implantable Pulse Generator Implant Date: 20150706
Lead Channel Impedance Value: 380 Ohm
Lead Channel Impedance Value: 494 Ohm
Lead Channel Pacing Threshold Amplitude: 1.125 V
Lead Channel Pacing Threshold Pulse Width: 0.4 ms
Lead Channel Sensing Intrinsic Amplitude: 5.5 mV
Lead Channel Sensing Intrinsic Amplitude: 5.5 mV
Lead Channel Setting Pacing Amplitude: 2.25 V
Lead Channel Setting Pacing Pulse Width: 0.4 ms
Lead Channel Setting Sensing Sensitivity: 0.3 mV

## 2023-05-07 LAB — POCT INR: INR: 2.3 (ref 2.0–3.0)

## 2023-05-08 ENCOUNTER — Ambulatory Visit (HOSPITAL_COMMUNITY): Payer: Self-pay | Admitting: Pharmacist

## 2023-05-12 ENCOUNTER — Other Ambulatory Visit: Payer: Self-pay

## 2023-05-14 ENCOUNTER — Ambulatory Visit (HOSPITAL_COMMUNITY): Payer: Self-pay | Admitting: Pharmacist

## 2023-05-14 LAB — POCT INR: INR: 2.1 (ref 2.0–3.0)

## 2023-05-18 ENCOUNTER — Ambulatory Visit (INDEPENDENT_AMBULATORY_CARE_PROVIDER_SITE_OTHER): Payer: Medicare Other | Admitting: Internal Medicine

## 2023-05-20 ENCOUNTER — Ambulatory Visit (HOSPITAL_COMMUNITY): Payer: Self-pay | Admitting: Pharmacist

## 2023-05-20 LAB — POCT INR: INR: 3.2 — AB (ref 2.0–3.0)

## 2023-05-20 NOTE — Progress Notes (Signed)
Remote ICD transmission.   

## 2023-05-25 ENCOUNTER — Other Ambulatory Visit (HOSPITAL_COMMUNITY): Payer: Self-pay | Admitting: *Deleted

## 2023-05-25 DIAGNOSIS — I5022 Chronic systolic (congestive) heart failure: Secondary | ICD-10-CM

## 2023-05-25 DIAGNOSIS — Z7901 Long term (current) use of anticoagulants: Secondary | ICD-10-CM

## 2023-05-25 DIAGNOSIS — Z95811 Presence of heart assist device: Secondary | ICD-10-CM

## 2023-05-26 ENCOUNTER — Ambulatory Visit (HOSPITAL_COMMUNITY): Payer: Self-pay | Admitting: Pharmacist

## 2023-05-26 ENCOUNTER — Ambulatory Visit (HOSPITAL_BASED_OUTPATIENT_CLINIC_OR_DEPARTMENT_OTHER)
Admission: RE | Admit: 2023-05-26 | Discharge: 2023-05-26 | Disposition: A | Discharge status: Home / Self Care | Payer: Medicare Other | Source: Ambulatory Visit | Attending: Cardiology | Admitting: Cardiology

## 2023-05-26 ENCOUNTER — Encounter (HOSPITAL_COMMUNITY): Payer: Self-pay | Admitting: Cardiology

## 2023-05-26 ENCOUNTER — Ambulatory Visit (HOSPITAL_COMMUNITY)
Admission: RE | Admit: 2023-05-26 | Discharge: 2023-05-26 | Disposition: A | Discharge status: Home / Self Care | Payer: Medicare Other | Source: Ambulatory Visit | Attending: Cardiology | Admitting: Cardiology

## 2023-05-26 ENCOUNTER — Other Ambulatory Visit (HOSPITAL_COMMUNITY): Payer: Self-pay | Admitting: Cardiology

## 2023-05-26 DIAGNOSIS — I5022 Chronic systolic (congestive) heart failure: Secondary | ICD-10-CM | POA: Diagnosis not present

## 2023-05-26 DIAGNOSIS — I2721 Secondary pulmonary arterial hypertension: Secondary | ICD-10-CM | POA: Insufficient documentation

## 2023-05-26 DIAGNOSIS — Z95811 Presence of heart assist device: Secondary | ICD-10-CM

## 2023-05-26 DIAGNOSIS — I130 Hypertensive heart and chronic kidney disease with heart failure and stage 1 through stage 4 chronic kidney disease, or unspecified chronic kidney disease: Secondary | ICD-10-CM | POA: Insufficient documentation

## 2023-05-26 DIAGNOSIS — Z7952 Long term (current) use of systemic steroids: Secondary | ICD-10-CM | POA: Insufficient documentation

## 2023-05-26 DIAGNOSIS — Z7982 Long term (current) use of aspirin: Secondary | ICD-10-CM | POA: Diagnosis not present

## 2023-05-26 DIAGNOSIS — N183 Chronic kidney disease, stage 3 unspecified: Secondary | ICD-10-CM | POA: Insufficient documentation

## 2023-05-26 DIAGNOSIS — I493 Ventricular premature depolarization: Secondary | ICD-10-CM | POA: Insufficient documentation

## 2023-05-26 DIAGNOSIS — I083 Combined rheumatic disorders of mitral, aortic and tricuspid valves: Secondary | ICD-10-CM | POA: Insufficient documentation

## 2023-05-26 DIAGNOSIS — R040 Epistaxis: Secondary | ICD-10-CM | POA: Insufficient documentation

## 2023-05-26 DIAGNOSIS — Z8673 Personal history of transient ischemic attack (TIA), and cerebral infarction without residual deficits: Secondary | ICD-10-CM | POA: Insufficient documentation

## 2023-05-26 DIAGNOSIS — Z95810 Presence of automatic (implantable) cardiac defibrillator: Secondary | ICD-10-CM | POA: Diagnosis not present

## 2023-05-26 DIAGNOSIS — E0590 Thyrotoxicosis, unspecified without thyrotoxic crisis or storm: Secondary | ICD-10-CM | POA: Insufficient documentation

## 2023-05-26 DIAGNOSIS — D649 Anemia, unspecified: Secondary | ICD-10-CM | POA: Insufficient documentation

## 2023-05-26 DIAGNOSIS — Z7901 Long term (current) use of anticoagulants: Secondary | ICD-10-CM | POA: Diagnosis not present

## 2023-05-26 DIAGNOSIS — C563 Malignant neoplasm of bilateral ovaries: Secondary | ICD-10-CM | POA: Diagnosis not present

## 2023-05-26 DIAGNOSIS — Z79899 Other long term (current) drug therapy: Secondary | ICD-10-CM | POA: Insufficient documentation

## 2023-05-26 DIAGNOSIS — Z90710 Acquired absence of both cervix and uterus: Secondary | ICD-10-CM | POA: Insufficient documentation

## 2023-05-26 DIAGNOSIS — I428 Other cardiomyopathies: Secondary | ICD-10-CM | POA: Diagnosis not present

## 2023-05-26 LAB — LACTATE DEHYDROGENASE: LDH: 185 U/L (ref 98–192)

## 2023-05-26 LAB — BASIC METABOLIC PANEL
Anion gap: 9 (ref 5–15)
BUN: 11 mg/dL (ref 6–20)
CO2: 24 mmol/L (ref 22–32)
Calcium: 9.2 mg/dL (ref 8.9–10.3)
Chloride: 105 mmol/L (ref 98–111)
Creatinine, Ser: 1.24 mg/dL — ABNORMAL HIGH (ref 0.44–1.00)
GFR, Estimated: 52 mL/min — ABNORMAL LOW (ref >60)
Glucose, Bld: 82 mg/dL (ref 70–99)
Potassium: 4.1 mmol/L (ref 3.5–5.1)
Sodium: 138 mmol/L (ref 135–145)

## 2023-05-26 LAB — ECHOCARDIOGRAM LIMITED
AV Mean grad: 3 mm[Hg]
AV Peak grad: 6.3 mm[Hg]
Ao pk vel: 1.25 m/s
Est EF: <20
S' Lateral: 6.8 cm

## 2023-05-26 LAB — CBC
HCT: 47.3 % — ABNORMAL HIGH (ref 36.0–46.0)
Hemoglobin: 14.4 g/dL (ref 12.0–15.0)
MCH: 25 pg — ABNORMAL LOW (ref 26.0–34.0)
MCHC: 30.4 g/dL (ref 30.0–36.0)
MCV: 82.1 fL (ref 80.0–100.0)
Platelets: 267 10*3/uL (ref 150–400)
RBC: 5.76 MIL/uL — ABNORMAL HIGH (ref 3.87–5.11)
RDW: 18 % — ABNORMAL HIGH (ref 11.5–15.5)
WBC: 8 10*3/uL (ref 4.0–10.5)
nRBC: 0 % (ref 0.0–0.2)

## 2023-05-26 LAB — PROTIME-INR
INR: 2.3 — ABNORMAL HIGH (ref 0.8–1.2)
Prothrombin Time: 25.6 s — ABNORMAL HIGH (ref 11.4–15.2)

## 2023-05-26 NOTE — Progress Notes (Signed)
Patient presented for 2 month f/u visit in V[facility] Clinic today alone. Reports no problems with VAD equipment or concerns with drive line.  States she has been feeling a lot better with recent speed adjustment to 5700. Denies lightheadedness, dizziness, falls, shortness of breath, heart failure symptoms, and signs of bleeding.   RAMP echo completed in clinic today per Dr Shirlee Latch. Speed increased to 5800 today. Low speed limit 5500.   Pt is participating in healthy weight and wellness program and has been able to increase her exercise routine without any limitation. Currently on 1.0 mg of Wegovy. Appetite has decreased since starting [facility]. Weight stable since last visit.   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 She has not taken Digoxin since 2023.    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 Pt had follow up with Endocrinology 01/28/23 where Methimazole was discontinued. She feels like her thyroid labs may be "mess up again" because she is having difficulty with weight loss. Next endocrinology visit 06/02/23.   Vital Signs:  Doppler Pressure: UTO  Automatic BP: 109/79 (90) HR: 74 SR  SPO2: UTO   Weight: 255.4 lbs w/o eqt Last weight: 255.8 lb w/o eqt  VAD Indication: Destination Therapy d/t BMI   VAD interrogation & Equipment Management: Speed: 5700 >> 5800 Flow: 5.3 Power: 4.5 w    PI: 3.0   Alarms: none Events: >100  Fixed speed: 5700 >> 5800 Low speed limit: 5400 >> 5500   Primary Controller:  Replace back up battery in 10 months. Back up controller:   Replace back up battery in 21 months.   Annual Equipment Maintenance on UBC/PM was performed on 09/08/22.    I reviewed the LVAD parameters from today and compared the results to the patient's prior recorded data. LVAD interrogation was NEGATIVE for significant power changes, NEGATIVE for clinical alarms and STABLE for PI events/speed drops. No  programming changes were made and pump is functioning within specified parameters. Pt is performing daily controller and system monitor self tests along with completing weekly and monthly maintenance for LVAD equipment.   LVAD equipment check completed and is in good working order. Back-up equipment present. Charged back up battery and performed self-test on equipment.    Exit Site Care: CDI. Drive line anchor secure. Pt denies fever or chills. Provided with 8 weekly kits, 10 cath grip anchors, and 3 boxes large tegaderms for home use.     Significant Events on VAD Support:  N/A  Device: Medtronic Therapies: on VF 240 VT 200 VVI: 40 Last check: 05/07/23   BP & Labs:  Doppler- unable to obtain as today's visit after multiple attempts   Hgb 14.4 - No S/S of bleeding. Specifically denies vaginal bleeding, melena/BRBPR or nosebleeds.    LDH stable at 185 with established baseline of 250 - 450. Denies tea-colored urine. No power elevations noted on interrogation.   Plan:  No medication changes Coumadin dosing per [name] Return to clinic in 2 months. We will complete your 2 yr Intermacs and annual maintenance at this visit. Please bring your back up equipment, battery charger, and MPU for maintenance. Please wear tennis shoes for your 6 minute walk.   Alyce Pagan RN VAD Coordinator  Office: [phone]  24/7 Pager: [phone]      History of Present Illness: [name] is a 52 y.o. female who has a history of morbid obesity, HTN, negative for sleep apnea, and systolic HF  due to nonischemic cardiomyopathy s/p Medtronic ICD (2006).  She was followed for systolic CHF initially in [place]. Apparently her EF recovered to 35-40%. However, she was admitted to [facility] 09/19/13 for worsening dyspnea and CP. Coronaries were reportedly OK on LHC at that time at [facility]. Echo showed EF of 15-20% with four-chamber enlargement with moderate-severe TR/MR. RHC as below. Rheumatological  w/u negative.  CPX in 9/16 showed near-normal functional capacity.  Repeat echo in 6/17 shows that EF remains 20%. Repeat CPX in 1/18 was submaximal but probably only mild circulatory limitation.  Echo 8/18 showed EF 25-30%, severe LV dilation.  CPX in 6/19 showed low normal functional capacity.  Echo in 10/19 showed EF 20-25%, moderate LV dilation.    Zio patch in 2/20 with 11% PVCs and NSVT, amiodarone started. Zio patch in 3/20 with 13.3% PVCs and NSVT.  She saw Dr. Graciela Husbands, he thought that increased PVCs were electromechanical in nature due to worsening heart failure.  Repeat Zio patch on amiodarone in 1/21 showed 6.2% PVCs.   She was admitted in 4/20 with VT in setting of hypokalemia.    CPX in 9/20 showed mild functional limitation, more due to body habitus than heart failure.  Echo in 4/21 showed EF 20% with severe LV dilation, diffuse hypokinesis, normal RV size and systolic function.   In 8/21, she had a shock for VF in the setting of hypokalemia.   Patient was admitted in 10/21 with CVA involving left MCA territory.  This was thought to be cardioembolic but LV thrombus was not visualized and no atrial fibrillation has been seen.  Echo in 10/21 showed EF 20-25%, severe LV dilation (no LV thrombus seen), mildly decreased RV systolic function, mild-moderate MR. She was taken by IR for thrombectomy and stent placement, now on ASA 81 and ticagrelor.   She had recurrent VT in 2/22, quinidine was added by EP.  Spironolactone and Coreg were decreased due to low BP and lightheadedness.    Echo in 4/22 showed EF 25% with severe LV dilation and global hypokinesis, normal RV, mild-moderate MR.   CPX (8/22) showed peak VO2 14.3, VE/VCO2 slope 42, RER 1.09 => mild to moderate HF limitation, elevated VE/VCO2 slope is likely false from end exercise hyperventilation. Restrictive lung physiology from obesity.   Patient was admitted in 3/23 with recurrent CHF exacerbation with low cardiac output.  She was  also noted to be hyperthyroidism but amiodarone had to be continued due to VT and poorly tolerated atrial fibrillation.  She required Impella 5.5 placement and eventual HM3 LVAD placement on 09/04/21.  Post-op course complicated by atrial fibrillation, malnutrition, and deconditioning.  She went to CIR prior to discharge.   Patient developed heavy vaginal bleeding in 7/23, she was seen in the ER and required 2 units PRBCs.  She was admitted in 8/23 and had hysterectomy + BSO, LVAD speed increased to 5900 rpm on ramp echo.  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    The patient was admitted overnight in 12/23 with severe posterior epistaxis requiring nasal pacing.   In 2/24, she had VF with shock.  Amiodarone was restarted.   Echo in 7/24 showed  EF < 20, mild RV dysfunction with normal size, interventricular septum shifted mildly to right.   At a previous visit in 12/2022, her VAD speed was increased to 6000. She took a torsemide a day before presenting to clinic in 02/2023 and noted an increased frequency of  PI events despite drinking water. Her speed was reduced to 5900 rpm. She continued to have suction events and speed was decreased to 5700 rpm in 11/24.   Ramp echo was done today, septum bowed to right at 5700 rpm, so speed increased to 5800 rpm.  The aortic valve opens each beat at 5800 rpm with moderate RV dysfunction/normal RV size.   She returns for followup of LVAD.  Weight is stable. No significant exertional dyspnea.  No lightheadedness.  No longer having suction alarms. She is taking torsemide only prn.  Overall, feels good.   LVAD interrogation: Please see LVAD nursing note above.   Labs: (11/09/13): Dig level 0.5, K 3.6, Creatinine 0.83, pro-BNP 622 (12/19/13): K 3.6, creatinine 0.8 (7/15): K 4.1, creatinine 0.91 (8/15): K 3.9, creatinine 0.91 (10/15): K 4, creatinine 0.93, proBNP 129 (12/15): K 4, creatinine 0.9, digoxin 0.4 (5/16): K 4,  creatinine 0.86 (9/16): K 3.6, creatinine 0.86, digoxin < 0.2 (5/17): K 4.1, creatinine 0.95, digoxin 0.3 (5/18): K 3.4, creatinine 0.95, TSH elevated but free T3 and free T4 normal.  (8/18): K 3.9, creatinine 0.87, BNP 108  (11/18): K 3.5, creatinine 1.04, digoxin 0.4 (1/19): LDL 91, HDL 47 (3/19): digoxin 0.7, K 4, creatinine 0.99 (4/19): TSH normal, LDL 95 (9/19): LDL 99, HDL 45, K 4, creatinine 0.96 (10/19): K 3.6, creatinine 0.85, digoxin 0.5 (2/20): LDL 103 (3/20): K 3.9, creatinine 1.05 (4/20): K 3.9, creatinine 1.23 => 1.15, TSH 8, digoxin 0.3, LFTs normal 6/20): K 4.3, creatinine 1.07, LFTs normal, digoxin 0.5 (7/20): K 3.8, creatinine 1.16 (8/20): digoxin < 0.2, LDL 135 (1/21): K 3.9, creatinine 1.05, LDL 171  (4/21): K 4.2, creatinine 0.95, LDL 127, TSH normal, LFTs normal (10/21): K 2.8, creatinine 1.21, LDL 130 (11/21): K 4.5, creatinine 1.3 => 1.2, LFTs normal, digoxin 1.3, TSH normal (3/22): K 4.1, creatinine 1.36 (4/22): hgb 10.8 (7/22): Transferrin 8%, digoxin 0.9, hgb 10.1, K 3.9, creatinine 1.42 (8/22): digoxin 0.4 (9/22): K 4.2, creatinine 1.32 (4/23): hgb 11, WBCs 13, K 3.9 => 3.8, creatinine 0.9 => 0.98, albumin 3.2, ALT 45, AST 31, LDH 272 => 244, TSH 0.235, free T4 1.84 (7/23): hgb 8, TSH normal 8/23): K 3.8, creatinine 1.15, hgb 11.4 (9/23): K 3.7, creatinine 1.36 (10/23): K 3.6, creatinine 1.42 (11/23): TSH normal (1/24): K 3.9, creatinine 1.17 (2/24): TSH 4.75, hgb 11.3, K 3.4, creatinine 1.35, LFTs normal (3/24): LFTs normal (5/24): K 4, creatinine 1.4, hgb 12.5, LDH 193 (7/24): K 4, creatinine 1.3, LFTs normal, hgb 12.3, LDL 115, LDH 221 (12/24): K 4.1, creatinine 1.24, LDH 185, hgb 14.4  PMH: 1. OSA: Using CPAP.  2. PVCs:  - Zio patch 2/20: 11% PVCs - Zio patch 3/20: 13.3% PVCs - Zio patch 1/21: 6.2% PVCs 3. VT/VF: 8/21, in setting of hypokalemia.  2/22 VT, quinidine added.  - VT 2/24, restarted amiodarone 4. 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   No LV thrombus visualized and atrial fibrillation has not been visualized.  - IR thrombectomy with stent placement in 10/21.  5. Chronic systolic CHF: Nonischemic cardiomyopathy:   - Coronary angiography 2015 at [facility]: Normal coronaries.  - Echo (8/15) with EF 20%, moderate LV dilation, diffuse hypokinesis, normal RV size with mildly decreased systolic function, mild MR.  - Echo (5/16) with EF 20%, spherical LV with diffuse hypokinesis, mild MR.  - Echo (6/17) with EF 20%, diffuse hypokinesis, moderate LV dilation, normal RV, mild to moderate MR.  - RHC (12/17): mean RA 5, PA 56/31, mean PCWP  24, CI 2, PVR 3.7 WU - Echo (8/18) with EF 25-30%, severe LV dilation, severe LAE.  - Echo (2/20) with EF 20-25%, moderate LV dilation, moderate MR.  - Echo (4/21) with EF 20-25%, severe LV dilation, mild-moderate MR, normal RV, PASP 26 mmHg.  - Echo (10/21) with EF 20-25%, severe LV dilation (no LV thrombus seen), mildly decreased RV systolic function, mild-moderate MR. - CPX in 9/20 showed mild functional limitation, more due to body habitus than heart failure.  - CPX in 8/21: Peak VO2 11.6, VE/VCO2 32, RER 1.2.  Mild HF limitation.  - Echo (4/22): EF 25% with severe LV dilation and global hypokinesis, normal RV, mild-moderate MR.  - CPX (8/22): peak VO2 14.3, VE/VCO2 slope 42, RER 1.09 => mild to moderate HF limitation, elevated VE/VCO2 slope is likely false from end exercise hyperventilation. Restrictive lung physiology from obesity.  - HM3 LVAD placement 09/04/21.  - Echo (7/24): EF < 20, mild RV dysfunction with normal size, interventricular septum shifted mildly to right. - Ramp echo (12/24): Speed increased to 5800 rpm.  Aortic valve opens each beat, moderate RV dysfunction with normal RV size.  6. PFTs (1/22): Near normal.  7. Fe deficiency anemia 8. 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  10. Left ovarian mass -  hysterectomy + BSO.  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      Current Outpatient Medications  Medication Sig Dispense Refill   ALPRAZolam (XANAX) 0.25 MG tablet Take 1 tablet (0.25 mg total) by mouth 2 (two) times daily as needed for anxiety. 20 tablet 2   amiodarone (PACERONE) 200 MG tablet Take 1 tablet (200 mg total) by mouth daily. 90 tablet 3   ASPIRIN LOW DOSE 81 MG tablet TAKE ONE TABLET BY MOUTH ONCE A DAY 30 tablet 11   dapagliflozin propanediol (FARXIGA) 10 MG TABS tablet Take 1 tablet (10 mg total) by mouth daily before breakfast. 90 tablet 3   estradiol (CLIMARA - DOSED IN MG/24 HR) 0.0375 mg/24hr patch Place 1 patch (0.0375 mg total) onto the skin every 7 (seven) days. 4 patch 11   quiniDINE gluconate 324 MG CR tablet Take 1 tablet (324 mg total) by mouth 2 (two) times daily. 60 tablet 6   rosuvastatin (CRESTOR) 40 MG tablet Take 1 tablet (40 mg total) by mouth daily. 90 tablet 3   Semaglutide-Weight Management (WEGOVY) 1 MG/0.5ML SOAJ Inject 1 mg into the skin once a week. 2 mL 0   sildenafil (REVATIO) 20 MG tablet Take 1 tablet (20 mg total) by mouth 3 (three) times daily. 90 tablet 6   spironolactone (ALDACTONE) 25 MG tablet Take 1 tablet (25 mg total) by mouth daily. 45 tablet 3   Vitamin D, Ergocalciferol, (DRISDOL) 1.25 MG (50000 UNIT) CAPS capsule Take 1 capsule (50,000 Units total) by mouth every 7 (seven) days. 13 capsule 1   warfarin (COUMADIN) 2 MG tablet Take 4 mg (2 tabs) daily or as directed by LVAD Clinic. (Patient taking differently: Take 2 mg by mouth daily at 4 PM. Take 4 mg (2 tabs) daily except Tues/Thursday take 2 mg or as directed by LVAD Clinic.) 180 tablet 3   potassium chloride SA (KLOR-CON M) 20 MEQ tablet Take 1 tablet (20 mEq total) by mouth daily. (Patient not taking: Reported on 05/26/2023) 270 tablet 3   Semaglutide-Weight Management (WEGOVY) 1.7 MG/0.75ML SOAJ Inject 1.7 mg into the skin once a week. (Patient not taking:  Reported on 05/26/2023) 3 mL  1   torsemide (DEMADEX) 20 MG tablet Take 1 tablet (20 mg total) by mouth every other day. Take 1 tablet (20 mg) by mouth as needed, or as directed by heart failure clinic. (Patient not taking: Reported on 05/26/2023) 180 tablet 3   No current facility-administered medications for this encounter.    Allergies:   Patient has no known allergies.   Social History:  The patient  reports that she has never smoked. She has never used smokeless tobacco. She reports that she does not drink alcohol and does not use drugs.   Family History:  The patient's family history includes Heart Problems in her mother; Heart disease in her maternal grandmother and mother; High blood pressure in her father; Hypertension in her father and mother; Obesity in her mother; Stroke in her father.   ROS:  Please see the history of present illness.   All other systems are personally reviewed and negative.   Exam:    MAP 80 General: Well appearing this am. NAD.  HEENT: Normal. Neck: Supple, JVP 7-8 cm. Carotids OK.  Cardiac:  Mechanical heart sounds with LVAD hum present.  Lungs:  CTAB, normal effort.  Abdomen:  NT, ND, no HSM. No bruits or masses. +BS  LVAD exit site: Well-healed and incorporated. Dressing dry and intact. No erythema or drainage. Stabilization device present and accurately applied. Driveline dressing changed daily per sterile technique. Extremities:  Warm and dry. No cyanosis, clubbing, rash, or edema.  Neuro:  Alert & oriented x 3. Cranial nerves grossly intact. Moves all 4 extremities w/o difficulty. Affect pleasant    Recent Labs: 03/17/2023: ALT 33; Magnesium 1.8 03/26/2023: TSH 7.39 05/26/2023: BUN 11; Creatinine, Ser 1.24; Hemoglobin 14.4; Platelets 267; Potassium 4.1; Sodium 138  Personally reviewed   Wt Readings from Last 3 Encounters:  05/26/23 115.8 kg (255 lb 6.4 oz)  04/22/23 115.7 kg (255 lb)  04/21/23 116 kg (255 lb 12.8 oz)    ASSESSMENT AND PLAN:   1. Chronic systolic HF:  Nonischemic cardiomyopathy.  She has a Medtronic ICD. Cause for cardiomyopathy is uncertain, initially identified peri-partum (after son's birth), so cannot rule out peri-partum CMP.  On and off, she has had frequent PVCs.   RHC in 8/15 showed normal filling pressures and preserved cardiac output. Echo in 4/22 with EF 25%, severe LV dilation.  CPX (8/22) with mild-moderate HF limitation.  Echo in 3/23 with EF < 20% with severe dilation, normal RV size with mildly decreased systolic function, moderate-severe functional MR with dilated IVC. 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 Impella 5.5 placed 08/29/21.  Patient had HM3 LVAD placed 09/04/21 and speed adjusted by ramp echo up to 5800 rpm and again up to 5900 rpm. In 7/24, LVAD speed increased to 6000 rpm under echo guidance. Developed PI and suction events, speed decreased to 5700 rpm.  Today, ramp echo done and speed increased to 5800 rpm.  The aortic valve opens each beat and the septum is midline.  NYHA class II, not volume overloaded on exam.   - She can continue to use torsemide prn.   - Continue Farxiga 10 mg daily.  - Continue spironolactone 25 mg daily.   - She should continue ASA 81 mg daily with history of CVA.  - Continue sildenafil 20 mg tid  - Warfarin for VAD, goal INR 2-2.5.  2. 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  She had been on Levoxyl at home prior to 3/23 admission, noted to be hyperthyroid in 3/23.  This likely contributes to her CHF. She had recurrent VT and poorly tolerated AF which made it difficult for [name] to stop amiodarone. Initially treated w/ IV Solumedrol -> transitioned to prednisone and methimazole.  Endocrinology following, prednisone has been titrated off, now titrated off methimazole.  We had to restart her on amiodarone with VF.   - Continue amiodarone 200 daily, check LFTs and will need regular eye exam.  3. PVCs/VT: EP consulted given complicated situation with hyperthyroidism and VT. She had a VF episode and amiodarone was restarted.  - Continue quinidine.  - Continue amiodarone, follow TSH closely.  4. CVA: CVA in 10/21, had thrombectomy with stent placement.  No LV thrombus noted and no atrial fibrillation noted.  - Would continue ASA 81 daily.   5. Anemia: CBC today.  6. CKD stage 3: Follow creatinine closely.  7. Mitral regurgitation: Moderate-severe functional MR pre-LVAD.  Unlikely to be Mitraclip candidate with severely dilated LV (>7 cm). Mitral regurgitation was mild on 7/24 echo and 12/24 echo.  8. Left ovarian mass: Hysterectomy + BSO.  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   9. Atrial fibrillation: Tolerates poorly.  NSR today.  10. Epistaxis: No recurrence.   Followup in 2 months.   Marca Ancona, MD  05/27/2023  [facility] [place] [address] [facility] [place] [address] [phone] (office) [phone] (fax)

## 2023-05-26 NOTE — Patient Instructions (Addendum)
No medication changes Coumadin dosing per [name] Return to clinic in 2 months. We will complete your 2 yr Intermacs and annual maintenance at this visit. Please bring your back up equipment, battery charger, and MPU for maintenance. Please wear tennis shoes for your 6 minute walk.

## 2023-05-26 NOTE — Progress Notes (Signed)
Speed  Flow  PI  Power  LVIDD  AI  Aortic opening MR  TR  Septum  RV  VTI (>18cm)  5700 5.0 4.7 4.5 8.0 trivial 5/5 none  none Slight bow right mild    5800  5.2 4.4 4.8 7.3 trivial 5/5 none none midline mild                                                            Doppler MAP: UTO Auto cuff BP: 109/79 (90)   Ramp ECHO performed at bedside per Dr Shirlee Latch.   At completion of ramp study, patients primary controller programmed:  Fixed speed: 5800 Low speed limit: 5500  Alyce Pagan RN VAD Coordinator  Office: [phone]  24/7 Pager: [phone]

## 2023-05-27 ENCOUNTER — Other Ambulatory Visit: Payer: Self-pay

## 2023-05-28 ENCOUNTER — Encounter: Payer: Self-pay | Admitting: Dietician

## 2023-05-28 ENCOUNTER — Other Ambulatory Visit: Payer: Self-pay

## 2023-05-28 ENCOUNTER — Encounter: Payer: Medicare Other | Attending: Family Medicine | Admitting: Dietician

## 2023-05-28 DIAGNOSIS — N1831 Chronic kidney disease, stage 3a: Secondary | ICD-10-CM | POA: Diagnosis not present

## 2023-05-28 DIAGNOSIS — N183 Chronic kidney disease, stage 3 unspecified: Secondary | ICD-10-CM | POA: Insufficient documentation

## 2023-05-28 DIAGNOSIS — Z713 Dietary counseling and surveillance: Secondary | ICD-10-CM | POA: Insufficient documentation

## 2023-05-28 NOTE — Patient Instructions (Addendum)
Continue to stay active- as tolerated and recommended by your MD  Wii fit dance  Walk away the pounds you tube  Treadmill  Walk outside  Resistance bands twice per week  Continue a low sodium diet. Focus on low carb vegetables (1/2 your plate)   Great job on all of your changes!

## 2023-05-28 NOTE — Progress Notes (Signed)
Medical Nutrition Therapy  Appointment Start time:  [phone]  Appointment End time:  0935  She increased the Wegovy dose and has tolerated it well but states that this is the last month that she will take this as it is not covered by her new insurance.  She is still going to [facility].  She is not going plant based at this time as they stated that she needed the meat to meet the protein requirements. She has stopped her torsemide per MD this is now prn and the other medications are helping keeping the fluid down. She will have her thyroid re-evaluated next week. Walking 3 times per week (sometimes more) for 30-40 minutes. She has started the Vitamin B-12 and feels more energy. She continues the Vitamin D as her levels are low.  Primary concerns today: She is taking Wegovy at 0.5 with no side effects but has not lost weight.  She is seen by [facility] program and it is not working to help her lose weight either.  MD from weight loss clinic wants her to learn more about diet for CKD.  She is now having blood in her urine and is to see a urologist.  She states that her thyroid is off.  Referral diagnosis: CHD stage 3 Preferred learning style: no preference indicated Learning readiness: ready, change in progress   NUTRITION ASSESSMENT  63" 255 lbs 05/28/2023 (L-vad subtracted) 260 per patient 03/26/2023 Gained 30 lbs in the past 6-7 months possibly related to thyroid.  Clinical Medical Hx: CKD, HLD, MI, CVA, hyper no hypothyroidism, CHF-L-VAD, nonischemic cardiomyopathy, h/o ovarian cancer (2023)- hysterectomy, anemia (iron and vitamin B-12) Medications: see list to include Coumadin, Farxiga, spronolactone, torsemide, Wegovy 0.5 and is to increase to 1, Labs: BUN 19, Creatine 1.34, Potassium 3.4, Sodium 132, eGFR 52 on 05/26/2023, Vitamin D 18 on 01/02/2023, vitamin B-12 616 04/09/2022, A1C 5.5% 01/02/2023 Notable Signs/Symptoms: blood and sugar in urine, negative  proteinuria  Lifestyle & Dietary Hx Patient lives with her husband.  She does the shopping and cooking.  Meal preps on Sunday.  Avoids added salt, reads labels for sodium.  She followed a vegetarian diet for 1 year (animal reasons) and she lost weight at that time .  She is on disability.  Estimated daily fluid intake: >64 oz per day Supplements: Vitamin D, occasional probiotic Sleep: good 8-10 hours per night Stress / self-care: none Current average weekly physical activity: 3 times per week for 45-60 minutes and was recently told to increase to 5-6 days per week if she can tolerate it (cardiac).  24-Hr Dietary Recall First Meal: Fairlife protein shake, banana OR greek yogurt and banana Snack: none Second Meal: salad with chicken Snack: none Third Meal: salmon, vegetables with sauce Snack:  none Beverages: water  Estimated Energy Needs Calories: 1400-1500 Protein: 70g  NUTRITION DIAGNOSIS  NB-1.1 Food and nutrition-related knowledge deficit As related to balance of carbohydrate, protein, and fat.  As evidenced by diet hx and patient report.   NUTRITION INTERVENTION  Nutrition education (E-1) on the following topics: continued Discussed vitamin K content of foods related to coumadin use Discussed plant based options as she has done this in the past.  Discussed plant based proteins. Discussed low sodium low fat diet high in vegetables and fruit to help with HTN.   Encouraged low carb vegetables as 1/2 the plate as a low calorie density food Avoid red meat - discussed protein with CKD  Handouts  Provided Include - initial visit NKD national kidney diet - Dish up a Kidney-Friendly Meal for Patients with Chronic Kidney Disease (not on dialysis)  Learning Style & Readiness for Change Teaching method utilized: Visual & Auditory  Demonstrated degree of understanding via: Teach Back  Barriers to learning/adherence to lifestyle change: none  Goals Established by Pt Continue to stay  active- as tolerated and recommended by your MD  Wii fit dance  Walk away the pounds you tube  Treadmill  Walk outside  Resistance bands twice per week  Continue a low sodium diet. Focus on low carb vegetables (1/2 your plate)   Great job on all of your changes!  MONITORING & EVALUATION Dietary intake, weekly physical activity, and label reading in 3 months.  Next Steps  Patient is to call for questions.

## 2023-06-02 ENCOUNTER — Ambulatory Visit: Payer: Medicare Other | Admitting: Internal Medicine

## 2023-06-02 ENCOUNTER — Encounter: Payer: Self-pay | Admitting: Internal Medicine

## 2023-06-02 VITALS — Ht 63.0 in | Wt 255.0 lb

## 2023-06-02 DIAGNOSIS — T462X5A Adverse effect of other antidysrhythmic drugs, initial encounter: Secondary | ICD-10-CM

## 2023-06-02 DIAGNOSIS — E064 Drug-induced thyroiditis: Secondary | ICD-10-CM | POA: Diagnosis not present

## 2023-06-02 NOTE — Progress Notes (Unsigned)
Name: [name]  MRN/ DOB: [id number], 1970/08/20    Age/ Sex: 52 y.o., female    PCP: Bethanie Dicker, NP   Reason for Endocrinology Evaluation: Hyperthyroid     Date of Initial Endocrinology Evaluation: 10/08/2021    HPI: [name] is a 52 y.o. female with a past medical history of CHF, nonischemic cardiomyopathy (S/P ICD placement and LVAD), CVA, pulmonary HTN, paroxysmal A-fib. The patient presented for initial endocrinology clinic visit on 4/18/2023for consultative assistance with her Hyperthyroidism.   Patient with extensive cardiac history to include A-fib, and nonischemic cardiomyopathy (s/p insertion of ICD) as well as left MCA territory CVA, s/p thrombectomy and stent placement.   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 08/2021 No FH of thyroid disease  No prior thyroid /neck sx   She was already on methimazole and prednisone on her initial visit with me which we have adjusted   Prednisone was stopped by 08/2022  Methimazole was stopped 01/28/2023  SUBJECTIVE:    Today (06/02/23): [name] is here for follow-up on hyperthyroidism   She continues to follow-up with cardiology for LVAD She has been evaluated by urology for microscopic hematuria She [facility] on [facility]  She continues to be on amiodarone Weight continues to fluctuate Denies local neck swelling  Has occasional palpitations  Denies tremors  No LE edema  Has noted  occasional constipation  Has noted hair loss  Has noted fatigue   HISTORY:  Past Medical History:  Past Medical History:  Diagnosis Date   Anemia    Automatic implantable cardioverter-defibrillator in situ    B12 deficiency    Cancer (HCC)    Chronic CHF (congestive heart failure) (HCC)    a. EF 15-20% b. RHC (09/2013) RA 14, RV 57/22, PA 64/36 (48), PCWP 18, FIck CO/CI 3.7/1.6, PVR 8.1 WU, PA sat 47%    Chronic kidney disease    Heart valve problem    History of heart attack    History of hypothyroidism    History of stomach ulcers    HLD (hyperlipidemia)    Hyperthyroidism    Hypotension    Hypothyroidism    Morbid obesity (HCC)    Myocardial infarction (HCC) 08/2013   Nocturnal dyspnea    Nonischemic cardiomyopathy (HCC)    Palpitations    Sinus tachycardia    Sleep apnea    Snoring-prob OSA 09/04/2011   SOB (shortness of breath)    Sprint Fidelis ICD lead RECALL  6949    Stroke (HCC) 2021   some residual right-sided weakness   UARS (upper airway resistance syndrome) 09/04/2011   HST 12/2013:  AHI 4/hr (numerous episodes of airflow reduction that did not have concomitant desaturation)    Vitamin D deficiency    Past Surgical History:  Past Surgical History:  Procedure Laterality Date   BREATH TEK H PYLORI N/A 11/09/2014   Procedure: BREATH TEK H PYLORI;  Surgeon: Gaynelle Adu, MD;  Location: [facility];  Service: General;  Laterality: N/A;   CARDIAC CATHETERIZATION  ~ 2006; 09/2013   CARDIAC CATHETERIZATION N/A 05/23/2016   Procedure: Right Heart Cath;  Surgeon: Laurey Morale, MD;  [facility] INVASIVE CV LAB;  Service: Cardiovascular;  Laterality: N/A;   CARDIAC DEFIBRILLATOR PLACEMENT  2006; 12/26/2013   Medtronic [id number]; 12-2013 ICD gen change and RV lead revision with new 6935 RV lead by Dr Graciela Husbands   CENTRAL LINE INSERTION  08/28/2021   Procedure: CENTRAL LINE INSERTION;  Surgeon: Laurey Morale, MD;  Location: [facility] INVASIVE CV LAB;  Service:  Cardiovascular;;   CESAREAN SECTION  06/23/1997   IMPLANTABLE CARDIOVERTER DEFIBRILLATOR GENERATOR CHANGE N/A 12/26/2013   Procedure: IMPLANTABLE CARDIOVERTER DEFIBRILLATOR GENERATOR CHANGE;  Surgeon: Duke Salvia, MD;  Location: [facility] CATH LAB;  Service: Cardiovascular;  Laterality: N/A;   INSERTION OF IMPLANTABLE LEFT VENTRICULAR ASSIST DEVICE N/A 09/03/2021   Procedure: INSERTION OF IMPLANTABLE LEFT VENTRICULAR ASSIST DEVICE/ Heartmate 3;  Surgeon: Lovett Sox, MD;  Location: [facility] OR;  Service: Open Heart Surgery;  Laterality: N/A;   IR CT HEAD LTD  04/17/2020   IR CT HEAD LTD  04/17/2020   IR INTRA CRAN STENT  04/17/2020   IR PERCUTANEOUS ART THROMBECTOMY/INFUSION INTRACRANIAL INC DIAG ANGIO  04/17/2020   IR RADIOLOGIST EVAL & MGMT  06/05/2020   LEAD REVISION N/A 12/26/2013   Procedure: LEAD REVISION;  Surgeon: Duke Salvia, MD;  Location: [facility] CATH LAB;  Service: Cardiovascular;  Laterality: N/A;   LEFT VENTRICULAR ASSIST DEVICE     PLACEMENT OF IMPELLA LEFT VENTRICULAR ASSIST DEVICE Right 08/29/2021   Procedure: PLACEMENT OF IMPELLA 5.5 LEFT VENTRICULAR ASSIST DEVICE;  Surgeon: Alleen Borne, MD;  Location: [facility] OR;  Service: Open Heart Surgery;  Laterality: Right;   RADIOLOGY WITH ANESTHESIA N/A 04/17/2020   Procedure: Code Stroke;  Surgeon: Julieanne Cotton, MD;  Location: [facility] OR;  Service: Radiology;  Laterality: N/A;   REMOVAL OF IMPELLA LEFT VENTRICULAR ASSIST DEVICE N/A 09/03/2021   Procedure: REMOVAL OF IMPELLA LEFT VENTRICULAR ASSIST DEVICE;  Surgeon: Lovett Sox, MD;  Location: [facility] OR;  Service: Open Heart Surgery;  Laterality: N/A;   RIGHT HEART CATH N/A 08/28/2021   Procedure: RIGHT HEART CATH;  Surgeon: Laurey Morale, MD;  Location: [facility] INVASIVE CV LAB;  Service: Cardiovascular;  Laterality: N/A;   RIGHT HEART CATHETERIZATION N/A 02/15/2014   Procedure: RIGHT HEART CATH;  Surgeon: Laurey Morale, MD;  Location: [facility] CATH LAB;  Service: Cardiovascular;  Laterality: N/A;    ROBOTIC ASSISTED TOTAL HYSTERECTOMY WITH BILATERAL SALPINGO OOPHERECTOMY N/A 02/05/2022   Procedure: XI ROBOTIC ASSISTED TOTAL HYSTERECTOMY WITH BILATERAL SALPINGO OOPHORECTOMY, CYSTOSCOPY, UTERUS GREATER THAN 250 GRAMS;  Surgeon: Carver Fila, MD;  Location: [facility] OR;  Service: Gynecology;  Laterality: N/A;   TEE WITHOUT CARDIOVERSION N/A 08/29/2021   Procedure: TRANSESOPHAGEAL ECHOCARDIOGRAM (TEE);  Surgeon: Alleen Borne, MD;  Location: [facility] OR;  Service: Open Heart Surgery;  Laterality: N/A;   TEE WITHOUT CARDIOVERSION N/A 09/03/2021   Procedure: TRANSESOPHAGEAL ECHOCARDIOGRAM (TEE);  Surgeon: Lovett Sox, MD;  Location: [facility] OR;  Service: Open Heart Surgery;  Laterality: N/A;   TUBAL LIGATION  06/23/1997    Social History:  reports that she has never smoked. She has never used smokeless tobacco. She reports that she does not drink alcohol and does not use drugs. Family History: family history includes Heart Problems in her mother; Heart disease in her maternal grandmother and mother; High blood pressure in her father; Hypertension in her father  and mother; Obesity in her mother; Stroke in her father.   HOME MEDICATIONS: Allergies as of 06/02/2023   No Known Allergies      Medication List        Accurate as of June 02, 2023  7:06 AM. If you have any questions, ask your nurse or doctor.          ALPRAZolam 0.25 MG tablet Commonly known as: XANAX Take 1 tablet (0.25 mg total) by mouth 2 (two) times daily as needed for anxiety.   amiodarone 200 MG tablet Commonly known as: PACERONE Take 1 tablet (200 mg total) by mouth daily.   Aspirin Low Dose 81 MG tablet Generic drug: aspirin EC TAKE ONE TABLET BY MOUTH ONCE A DAY   estradiol 0.0375 mg/24hr patch Commonly known as: CLIMARA - Dosed in mg/24 hr Place 1 patch (0.0375 mg total) onto the skin every 7 (seven) days.   Farxiga 10 MG Tabs tablet Generic drug: dapagliflozin propanediol Take 1 tablet (10 mg total)  by mouth daily before breakfast.   potassium chloride SA 20 MEQ tablet Commonly known as: KLOR-CON M Take 1 tablet (20 mEq total) by mouth daily.   quiniDINE gluconate 324 MG CR tablet Take 1 tablet (324 mg total) by mouth 2 (two) times daily.   rosuvastatin 40 MG tablet Commonly known as: CRESTOR Take 1 tablet (40 mg total) by mouth daily.   sildenafil 20 MG tablet Commonly known as: REVATIO Take 1 tablet (20 mg total) by mouth 3 (three) times daily.   spironolactone 25 MG tablet Commonly known as: ALDACTONE Take 1 tablet (25 mg total) by mouth daily.   torsemide 20 MG tablet Commonly known as: DEMADEX Take 1 tablet (20 mg total) by mouth every other day. Take 1 tablet (20 mg) by mouth as needed, or as directed by heart failure clinic.   Vitamin D (Ergocalciferol) 1.25 MG (50000 UNIT) Caps capsule Commonly known as: DRISDOL Take 1 capsule (50,000 Units total) by mouth every 7 (seven) days.   warfarin 2 MG tablet Commonly known as: COUMADIN Take as directed by the anticoagulation clinic. If you are unsure how to take this medication, talk to your nurse or doctor. Original instructions: Take 4 mg (2 tabs) daily or as directed by LVAD Clinic. What changed:  how much to take how to take this when to take this additional instructions   Wegovy 1 MG/0.5ML Soaj Generic drug: Semaglutide-Weight Management Inject 1 mg into the skin once a week.   Wegovy 1.7 MG/0.75ML Soaj Generic drug: Semaglutide-Weight Management Inject 1.7 mg into the skin once a week.          REVIEW OF SYSTEMS: A comprehensive ROS was conducted with the patient and is negative except as per HPI    OBJECTIVE:  VS: LMP 03/28/2021 (Approximate)    Wt Readings from Last 3 Encounters:  05/26/23 255 lb 6.4 oz (115.8 kg)  04/22/23 255 lb (115.7 kg)  04/21/23 255 lb 12.8 oz (116 kg)     EXAM: General: Pt appears well and is in NAD  Eyes: External eye exam normal without stare, lid lag or  exophthalmos.  EOM intact.    Neck: General: Supple without adenopathy. Thyroid: Thyroid size normal.  No goiter or nodules appreciated.   Lungs: Clear with good BS bilat   Extremities:  BL LE: No pretibial edema  Mental Status: Judgment, insight: Intact Orientation: Oriented to time, place, and person Mood and affect: No depression, anxiety, or agitation  DATA REVIEWED:    Latest Reference Range & Units 11/28/22 11:57  TSH 0.35 - 5.50 uIU/mL 5.79 (H)  Triiodothyronine,Free,Serum 2.3 - 4.2 pg/mL 2.6  T4,Free(Direct) 0.60 - 1.60 ng/dL 0.45    Latest Reference Range & Units 05/26/23 09:00  Sodium 135 - 145 mmol/L 138  Potassium 3.5 - 5.1 mmol/L 4.1  Chloride 98 - 111 mmol/L 105  CO2 22 - 32 mmol/L 24  Glucose 70 - 99 mg/dL 82  BUN 6 - 20 mg/dL 11  Creatinine 4.09 - 8.11 mg/dL 9.14 (H)  Calcium 8.9 - 10.3 mg/dL 9.2  Anion gap 5 - 15  9  GFR, Estimated >60 mL/min 52 (L)  (H): Data is abnormally high (L): Data is abnormally low   Thyroid ultrasound 08/27/2021  Right thyroid lobe is moderately heterogeneous with at least 2 small nodules, largest measuring 0.5 cm. These nodules do not meet criteria for biopsy or dedicated follow-up.   No definite thyroid tissue in the expected region of the left thyroid lobe. 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   IMPRESSION: 1. 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 2. Thyroid tissue is heterogeneous with small nodules. These nodules do not meet criteria for biopsy or dedicated follow-up.     ASSESSMENT/PLAN/RECOMMENDATIONS:   Amiodarone-induced thyroid disease:   -The patient was initially diagnosed with hypothyroidism and was on LT-for replacement for decades prior to developing hyperthyroidism in 08/2021 -The patient was on amiodarone from 07/2018 until 09/2021, restarted again in 08/2022 -LT-4 replacement was discontinued and she has been on methimazole  since 08/26/2021 as well as prednisone -Prednisone was discontinued by March 2024 -TSH elevated today, will decrease methimazole as below   Medications :   Follow-up in 6 months Labs in 2 months  Signed electronically by: Lyndle Herrlich, MD  [facility]  [facility] [address] [place] [place], [place] [address] Phone: [phone] FAX: [phone]   CC: Bethanie Dicker, NP [address] [place] [address] Phone: [phone] Fax: [phone]   Return to Endocrinology clinic as below: Future Appointments  Date Time Provider Department Center  06/02/2023  9:30 AM Kuulei Kleier, Konrad Dolores, MD [facility] None  06/03/2023 11:40 AM Langston Reusing, MD [facility] None  06/23/2023  9:40 AM Duke Salvia, MD [facility] None  07/07/2023  8:20 AM Bethanie Dicker, NP [facility] PEC  08/03/2023  9:00 AM Laurey Morale, MD [facility] None  08/06/2023  7:05 AM [facility] DEVICE REMOTES [facility]  09/03/2023  9:00 AM Bonnita Levan, RD [facility] NDM  09/11/2023  3:30 PM Carver Fila, MD [facility] None  10/21/2023  1:30 PM [facility] ANNUAL WELLNESS VISIT [facility]EC  11/05/2023  7:05 AM [facility] DEVICE REMOTES [facility]

## 2023-06-03 ENCOUNTER — Ambulatory Visit (INDEPENDENT_AMBULATORY_CARE_PROVIDER_SITE_OTHER): Payer: Medicare Other | Admitting: Family Medicine

## 2023-06-03 VITALS — HR 73 | Temp 97.7°F | Ht 63.0 in | Wt 253.0 lb

## 2023-06-03 DIAGNOSIS — E66813 Obesity, class 3: Secondary | ICD-10-CM | POA: Diagnosis not present

## 2023-06-03 DIAGNOSIS — N1831 Chronic kidney disease, stage 3a: Secondary | ICD-10-CM

## 2023-06-03 DIAGNOSIS — I428 Other cardiomyopathies: Secondary | ICD-10-CM

## 2023-06-03 DIAGNOSIS — Z6841 Body Mass Index (BMI) 40.0 and over, adult: Secondary | ICD-10-CM | POA: Diagnosis not present

## 2023-06-03 LAB — T3, FREE: T3, Free: 2.6 pg/mL (ref 2.3–4.2)

## 2023-06-03 LAB — TSH: TSH: 5.03 m[IU]/L — ABNORMAL HIGH

## 2023-06-03 LAB — T4, FREE: Free T4: 1.3 ng/dL (ref 0.8–1.8)

## 2023-06-03 NOTE — Progress Notes (Signed)
   SUBJECTIVE:  Chief Complaint: Obesity  Interim History: Patient has had a very medically complex last 4 years.  She is trying to lose weight to get on heart transplant list. She feels like every time she starts to lose weight her weight will go up.  She is on Wegovy and is moving up to the 1.7mg .  She has been eating protein and vegetables most days.  She was trying to limit her meat consumption due to her kidney function test.  Not much planned for the next few weeks for the holidays- staying home and laying low.  [name] is here to discuss her progress with her obesity treatment plan. She is on the following a lower carbohydrate, vegetable and lean protein rich diet plan and states she is following her eating plan approximately 85 % of the time. She states she is exercising 30 minutes 3-4 times per week.   OBJECTIVE: Visit Diagnoses: Problem List Items Addressed This Visit       Cardiovascular and Mediastinum   NICM (nonischemic cardiomyopathy) [facility])   Patient sees cardiology.  She is dealing with many medical comborbidities.  She is awaiting placement on a heart transplant list.  She is on wegovy but isn't experiencing much in terms of weight loss or food intake control. She is hopeful that a higher dose will help her.  We are increasing Wegovy to 1.7mg  weekly.        Genitourinary   Stage 3a chronic kidney disease (HCC)   Kidney disease likely multifactorial.  Patient needs medication for heart failure.  She will continue to follow up with her medical subspecialists. Upon review creatinine has been stable over the last 9 months.         Other   Obesity, Class III, BMI 40-49.9 (morbid obesity) (HCC)   Other Visit Diagnoses       BMI 40.0-44.9, adult (HCC)    -  Primary       No data recorded No data recorded  No data recorded No data recorded   ASSESSMENT AND PLAN:  Diet: Dayatra is currently in the action stage of change. As such, her goal is to continue with weight  loss efforts. She has agreed to Category 3 Plan and Pescatarian Plan + 300 calories.  Exercise: Sabreena has been instructed that some exercise is better than none for weight loss and overall health benefits.   Behavior Modification:  We discussed the following Behavioral Modification Strategies today: increasing lean protein intake, increasing vegetables, meal planning and cooking strategies, and holiday eating strategies. We discussed various medication options to help [name] with her weight loss efforts and we both agreed to increase Wegovy to 1.7mg  weekly.  No follow-ups on file.Marland Kitchen She was informed of the importance of frequent follow up visits to maximize her success with intensive lifestyle modifications for her multiple health conditions.  Attestation Statements:   Reviewed by clinician on day of visit: allergies, medications, problem list, medical history, surgical history, family history, social history, and previous encounter notes.     Reuben Likes, MD

## 2023-06-04 ENCOUNTER — Ambulatory Visit (HOSPITAL_COMMUNITY): Payer: Self-pay | Admitting: Pharmacist

## 2023-06-04 ENCOUNTER — Encounter: Payer: Self-pay | Admitting: Pharmacist

## 2023-06-04 LAB — POCT INR: INR: 2.6 (ref 2.0–3.0)

## 2023-06-04 NOTE — Progress Notes (Signed)
Pharmacy Quality Measure Review  This patient is appearing on a report for being at risk of failing the adherence measure for cholesterol (statin) medications this calendar year.   Medication: rosuvastatin 40 mg tablet Last fill date: 12/26/22 for 90 day supply  Last fill canceled by [facility] for "Refill too soon".  Rx was never filled thereafter.  Spoke with patient via phone. She states that she still has medication remaining. Last filled 12/26/22 for 90 days to which she reports that she had some extra medication on hand. Aware that she has refills remaining at [facility] pharmacy.

## 2023-06-05 ENCOUNTER — Other Ambulatory Visit (HOSPITAL_BASED_OUTPATIENT_CLINIC_OR_DEPARTMENT_OTHER): Payer: Self-pay

## 2023-06-11 ENCOUNTER — Ambulatory Visit (HOSPITAL_COMMUNITY): Payer: Self-pay | Admitting: Pharmacist

## 2023-06-11 LAB — POCT INR: INR: 2.4 (ref 2.0–3.0)

## 2023-06-12 ENCOUNTER — Telehealth: Payer: Self-pay

## 2023-06-12 NOTE — Telephone Encounter (Signed)
Health team advantage form has been received needing provider signature. Form has been placed in provider to be signed folder

## 2023-06-13 NOTE — Assessment & Plan Note (Signed)
Kidney disease likely multifactorial.  Patient needs medication for heart failure.  She will continue to follow up with her medical subspecialists. Upon review creatinine has been stable over the last 9 months.

## 2023-06-13 NOTE — Assessment & Plan Note (Signed)
Patient sees cardiology.  She is dealing with many medical comborbidities.  She is awaiting placement on a heart transplant list.  She is on wegovy but isn't experiencing much in terms of weight loss or food intake control. She is hopeful that a higher dose will help her.  We are increasing Wegovy to 1.7mg  weekly.

## 2023-06-18 ENCOUNTER — Ambulatory Visit (HOSPITAL_COMMUNITY): Payer: Self-pay | Admitting: Student-PharmD

## 2023-06-18 LAB — POCT INR: INR: 2.5 (ref 2.0–3.0)

## 2023-06-18 NOTE — Telephone Encounter (Signed)
Form faxed to the given fax number [phone] with a completed transmission log

## 2023-06-21 ENCOUNTER — Other Ambulatory Visit: Payer: Self-pay

## 2023-06-23 ENCOUNTER — Ambulatory Visit: Payer: Medicare Other | Attending: Internal Medicine | Admitting: Internal Medicine

## 2023-06-23 ENCOUNTER — Encounter: Payer: Self-pay | Admitting: Internal Medicine

## 2023-06-23 VITALS — BP 98/64 | HR 92 | Ht 63.0 in | Wt 256.0 lb

## 2023-06-23 DIAGNOSIS — Z95810 Presence of automatic (implantable) cardiac defibrillator: Secondary | ICD-10-CM

## 2023-06-23 DIAGNOSIS — Z79899 Other long term (current) drug therapy: Secondary | ICD-10-CM

## 2023-06-23 DIAGNOSIS — I428 Other cardiomyopathies: Secondary | ICD-10-CM

## 2023-06-23 DIAGNOSIS — I5022 Chronic systolic (congestive) heart failure: Secondary | ICD-10-CM

## 2023-06-23 DIAGNOSIS — I472 Ventricular tachycardia, unspecified: Secondary | ICD-10-CM | POA: Diagnosis not present

## 2023-06-23 DIAGNOSIS — Z95811 Presence of heart assist device: Secondary | ICD-10-CM

## 2023-06-23 DIAGNOSIS — I493 Ventricular premature depolarization: Secondary | ICD-10-CM

## 2023-06-23 NOTE — Progress Notes (Signed)
 Patient ID: [name], female   DOB: 1970/08/03, 52 y.o.   MRN: [id number]       Patient Care Team: Gretel App, NP as PCP - General (Nurse Practitioner) Rolan Ezra RAMAN, MD as PCP - Cardiology (Cardiology) Fernande Elspeth BROCKS, MD as PCP - Electrophysiology (Cardiology) Shlomo Wilbert SAUNDERS, MD as PCP - Sleep Medicine (Cardiology)   HPI  ROXSANA Clark is a 52 y.o. female seen in follow-up for  Medtronic  ICD implanted (Gen change 2015) for nonischemic cardiomyopathy.  Associated with frequent PVCs  she has a history of ventricular tachycardia with polymorphic and monomorphic treated by her device and is on amiodarone .  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  Question cardioembolic.  Underwent stenting.  On DAPT.  To see neuroradiology soon with the question of discontinuing Brilinta   Recurrent shocks in the setting of hypokalemia most recently 8/21 VT therapy 08/12/20 at which time potassium was presumed normal, 2/15--3.9  &  3/1--4.1  2/24 had fast VT treated with ATP; (prior chart erroneously describes it as a shock) amiodarone  previously discontinued was resumed .    Has hyperthyroidism previously on methimazole   Largely without symptoms of shortness of breath edema chest pain.  Has some lightheadedness that was related to suction events, and transient shortness of breath related to the lower flows but now feeling better      DATE TEST EF%    4/15 LHC (CE)  Cors w/out obstruction  8/15 Echo   20 %    12/17 Cardio Cath (CE)    10/19 Echo  20-25% MR mod  10/21 Echo  20-25%    1/22 Echo 20-25%   5/22 Echo 25%   3/23 Echo  <20%    7/24 Echo  <20% MR mod    Date PVCs     /2016 1%    2/20 11%--VTNS Monomorphic pop  VTNS -MM and PM   3/20 13.3% Leona morphic population 7.& 4 % Total   1/21 6.2%     Date Cr K TSH LFTs Hgb WBC Platelets  4/20 1.37 4.0 12.027 26       11/21  1.28 3.2 (adjusted) 3.1 14 9.6    5/22 1.42 3.9 4.06 (2/22) 14 10.8(4/22) 4.9K (4/22)   5/23 0.85 3.3 0.013  11.8 7.2 (4/23) 250 (4/23)  5/24 1.41 4.0 5.79  12.5  273  12/24 1.24 4.0 5.03  14.4     Antiarrhythmics Date   Amio Stopped and resumed    Mex  ineffective  Quinidine           Records and Results Reviewed   Past Medical History:  Diagnosis Date   Anemia    Automatic implantable cardioverter-defibrillator in situ    B12 deficiency    Cancer (HCC)    Chronic CHF (congestive heart failure) (HCC)    a. EF 15-20% b. RHC (09/2013) RA 14, RV 57/22, PA 64/36 (48), PCWP 18, FIck CO/CI 3.7/1.6, PVR 8.1 WU, PA sat 47%    Chronic kidney disease    Heart valve problem    History of heart attack    History of hypothyroidism    History of stomach ulcers    HLD (hyperlipidemia)    Hyperthyroidism    Hypotension    Hypothyroidism    Morbid obesity (HCC)  Myocardial infarction (HCC) 08/2013   Nocturnal dyspnea    Nonischemic cardiomyopathy (HCC)    Palpitations    Sinus tachycardia    Sleep apnea    Snoring-prob OSA 09/04/2011   SOB (shortness of breath)    Sprint Fidelis ICD lead RECALL  6949    Stroke (HCC) 2021   some residual right-sided weakness   UARS (upper airway resistance syndrome) 09/04/2011   HST 12/2013:  AHI 4/hr (numerous episodes of airflow reduction that did not have concomitant desaturation)    Vitamin D  deficiency     Past Surgical History:  Procedure Laterality Date   BREATH TEK H PYLORI N/A 11/09/2014   Procedure: BREATH TEK H PYLORI;  Surgeon: Camellia Blush, MD;  Location: [facility];  Service: General;  Laterality: N/A;   CARDIAC CATHETERIZATION  ~ 2006; 09/2013   CARDIAC CATHETERIZATION N/A 05/23/2016   Procedure: Right Heart Cath;  Surgeon: Ezra GORMAN Shuck, MD;  Location: [facility] INVASIVE CV LAB;  Service: Cardiovascular;  Laterality: N/A;   CARDIAC  DEFIBRILLATOR PLACEMENT  2006; 12/26/2013   Medtronic [id number]; 12-2013 ICD gen change and RV lead revision with new 6935 RV lead by Dr Fernande   CENTRAL LINE INSERTION  08/28/2021   Procedure: CENTRAL LINE INSERTION;  Surgeon: Shuck Ezra GORMAN, MD;  Location: [facility] INVASIVE CV LAB;  Service: Cardiovascular;;   CESAREAN SECTION  06/23/1997   IMPLANTABLE CARDIOVERTER DEFIBRILLATOR GENERATOR CHANGE N/A 12/26/2013   Procedure: IMPLANTABLE CARDIOVERTER DEFIBRILLATOR GENERATOR CHANGE;  Surgeon: Elspeth JAYSON Fernande, MD;  Location: [facility] CATH LAB;  Service: Cardiovascular;  Laterality: N/A;   INSERTION OF IMPLANTABLE LEFT VENTRICULAR ASSIST DEVICE N/A 09/03/2021   Procedure: INSERTION OF IMPLANTABLE LEFT VENTRICULAR ASSIST DEVICE/ Heartmate 3;  Surgeon: Obadiah Coy, MD;  Location: [facility] OR;  Service: Open Heart Surgery;  Laterality: N/A;   IR CT HEAD LTD  04/17/2020   IR CT HEAD LTD  04/17/2020   IR INTRA CRAN STENT  04/17/2020   IR PERCUTANEOUS ART THROMBECTOMY/INFUSION INTRACRANIAL INC DIAG ANGIO  04/17/2020   IR RADIOLOGIST EVAL & MGMT  06/05/2020   LEAD REVISION N/A 12/26/2013   Procedure: LEAD REVISION;  Surgeon: Elspeth JAYSON Fernande, MD;  Location: [facility] CATH LAB;  Service: Cardiovascular;  Laterality: N/A;   LEFT VENTRICULAR ASSIST DEVICE     PLACEMENT OF IMPELLA LEFT VENTRICULAR ASSIST DEVICE Right 08/29/2021   Procedure: PLACEMENT OF IMPELLA 5.5 LEFT VENTRICULAR ASSIST DEVICE;  Surgeon: Lucas Dorise POUR, MD;  Location: [facility] OR;  Service: Open Heart Surgery;  Laterality: Right;   RADIOLOGY WITH ANESTHESIA N/A 04/17/2020   Procedure: Code Stroke;  Surgeon: Dolphus Carrion, MD;  Location: [facility] OR;  Service: Radiology;  Laterality: N/A;   REMOVAL OF IMPELLA LEFT VENTRICULAR ASSIST DEVICE N/A 09/03/2021   Procedure: REMOVAL OF IMPELLA LEFT VENTRICULAR ASSIST DEVICE;  Surgeon: Obadiah Coy, MD;  Location: [facility] OR;  Service: Open Heart Surgery;  Laterality: N/A;   RIGHT HEART CATH N/A 08/28/2021   Procedure: RIGHT  HEART CATH;  Surgeon: Shuck Ezra GORMAN, MD;  Location: [facility] INVASIVE CV LAB;  Service: Cardiovascular;  Laterality: N/A;   RIGHT HEART CATHETERIZATION N/A 02/15/2014   Procedure: RIGHT HEART CATH;  Surgeon: Ezra GORMAN Shuck, MD;  Location: [facility] CATH LAB;  Service: Cardiovascular;  Laterality: N/A;   ROBOTIC ASSISTED TOTAL HYSTERECTOMY WITH BILATERAL SALPINGO OOPHERECTOMY N/A 02/05/2022   Procedure: XI ROBOTIC ASSISTED TOTAL HYSTERECTOMY WITH BILATERAL SALPINGO OOPHORECTOMY, CYSTOSCOPY, UTERUS GREATER THAN 250 GRAMS;  Surgeon: Viktoria Comer SAUNDERS, MD;  Location: [facility] OR;  Service:  Gynecology;  Laterality: N/A;   TEE WITHOUT CARDIOVERSION N/A 08/29/2021   Procedure: TRANSESOPHAGEAL ECHOCARDIOGRAM (TEE);  Surgeon: Lucas Dorise POUR, MD;  Location: [facility] OR;  Service: Open Heart Surgery;  Laterality: N/A;   TEE WITHOUT CARDIOVERSION N/A 09/03/2021   Procedure: TRANSESOPHAGEAL ECHOCARDIOGRAM (TEE);  Surgeon: Obadiah Coy, MD;  Location: [facility] OR;  Service: Open Heart Surgery;  Laterality: N/A;   TUBAL LIGATION  06/23/1997    Current Meds  Medication Sig   ALPRAZolam  (XANAX ) 0.25 MG tablet Take 1 tablet (0.25 mg total) by mouth 2 (two) times daily as needed for anxiety.   amiodarone  (PACERONE ) 200 MG tablet Take 1 tablet (200 mg total) by mouth daily.   ASPIRIN  LOW DOSE 81 MG tablet TAKE ONE TABLET BY MOUTH ONCE A DAY   dapagliflozin  propanediol (FARXIGA ) 10 MG TABS tablet Take 1 tablet (10 mg total) by mouth daily before breakfast.   estradiol  (CLIMARA  - DOSED IN MG/24 HR) 0.0375 mg/24hr patch Place 1 patch (0.0375 mg total) onto the skin every 7 (seven) days.   potassium chloride  SA (KLOR-CON  M) 20 MEQ tablet Take 1 tablet (20 mEq total) by mouth daily.   quiniDINE gluconate  324 MG CR tablet Take 1 tablet (324 mg total) by mouth 2 (two) times daily.   rosuvastatin  (CRESTOR ) 40 MG tablet Take 1 tablet (40 mg total) by mouth daily.   Semaglutide -Weight Management (WEGOVY ) 1.7 MG/0.75ML SOAJ Inject 1.7 mg into  the skin once a week.   sildenafil  (REVATIO ) 20 MG tablet Take 1 tablet (20 mg total) by mouth 3 (three) times daily.   spironolactone  (ALDACTONE ) 25 MG tablet Take 1 tablet (25 mg total) by mouth daily.   torsemide  (DEMADEX ) 20 MG tablet Take 1 tablet (20 mg total) by mouth every other day. Take 1 tablet (20 mg) by mouth as needed, or as directed by heart failure clinic.   Vitamin D , Ergocalciferol , (DRISDOL ) 1.25 MG (50000 UNIT) CAPS capsule Take 1 capsule (50,000 Units total) by mouth every 7 (seven) days.   warfarin (COUMADIN ) 2 MG tablet Take 4 mg (2 tabs) daily or as directed by LVAD Clinic. (Patient taking differently: Take 2 mg by mouth daily at 4 PM. Take 4 mg (2 tabs) daily except Tues/Thursday take 2 mg or as directed by LVAD Clinic.)    No Known Allergies  Review of Systems negative except from HPI and PMH  Physical Exam: BP 98/64 (BP Location: Left Arm, Patient Position: Sitting, Cuff Size: Large)   Pulse 92   Ht 5' 3 (1.6 m)   Wt 256 lb (116.1 kg)   LMP 03/28/2021 (Approximate)   SpO2 91%   BMI 45.35 kg/m  Well developed and well nourished in no acute distress HENT normal Neck supple with JVP-flat Clear Device pocket well healed; without hematoma or erythema.  There is no tethering  Regular rate and rhythm, no  gallop No  murmur with LVAD hum Abd-soft with active BS No Clubbing cyanosis  edema Skin-warm and dry A & Oriented  Grossly normal sensory and motor function  ECG sinus   Device function is normal. Programming changes none  See Paceart for details   Multiple episodes of very fast ventricular tachycardia treated with ATP 2/24.  None of these were cleanly terminated but all of them terminated spontaneously before second therapy was necessary.   Assessment and  Plan:  Nonischemic cardiomyopathy  Ventricular tachycardia-monomorphic and polymorphic-recurrent  LVAD in place  PVCs variably frequent    Systolic heart failure-chronic  Implantable  defibrillator-Medtronic     High Risk Medication Surveillance --amio/quinidine   MR/TR  Stroke with intracranial stenting on DAPT    No interval ventricular tachycardia.  Will continue the amiodarone  and the quinidine.  Amiodarone  surveillance laboratories were normal.  Quinidine needs a absolute neutrophil count.  Total white count was normal.  Will repeat the CBC with differential

## 2023-06-23 NOTE — Patient Instructions (Addendum)
Medication Instructions:  Your physician recommends that you continue on your current medications as directed. Please refer to the Current Medication list given to you today.  *If you need a refill on your cardiac medications before your next appointment, please call your pharmacy*   Lab Work: CBC If you have labs (blood work) drawn today and your tests are completely normal, you will receive your results only by: MyChart Message (if you have MyChart) OR A paper copy in the mail If you have any lab test that is abnormal or we need to change your treatment, we will call you to review the results.   Testing/Procedures: None ordered.    Follow-Up: At [facility], you and your health needs are our priority.  As part of our continuing mission to provide you with exceptional heart care, we have created designated Provider Care Teams.  These Care Teams include your primary Cardiologist (physician) and Advanced Practice Providers (APPs -  Physician Assistants and Nurse Practitioners) who all work together to provide you with the care you need, when you need it.  We recommend signing up for the patient portal called "MyChart".  Sign up information is provided on this After Visit Summary.  MyChart is used to connect with patients for Virtual Visits (Telemedicine).  Patients are able to view lab/test results, encounter notes, upcoming appointments, etc.  Non-urgent messages can be sent to your provider as well.   To learn more about what you can do with MyChart, go to [email or web address].    Your next appointment:   12 months with Dr Graciela Husbands

## 2023-06-25 ENCOUNTER — Ambulatory Visit (HOSPITAL_COMMUNITY): Payer: Self-pay | Admitting: Pharmacist

## 2023-06-25 LAB — POCT INR: INR: 2 (ref 2.0–3.0)

## 2023-06-28 LAB — CUP PACEART INCLINIC DEVICE CHECK
Date Time Interrogation Session: 20241231142716
Implantable Lead Connection Status: 753985
Implantable Lead Implant Date: 20150706
Implantable Lead Location: 753860
Implantable Pulse Generator Implant Date: 20150706

## 2023-07-02 ENCOUNTER — Ambulatory Visit (HOSPITAL_COMMUNITY): Payer: Self-pay | Admitting: Pharmacist

## 2023-07-02 LAB — POCT INR: INR: 2.4 (ref 2.0–3.0)

## 2023-07-03 ENCOUNTER — Other Ambulatory Visit: Payer: Self-pay | Admitting: Nurse Practitioner

## 2023-07-03 ENCOUNTER — Other Ambulatory Visit (HOSPITAL_COMMUNITY): Payer: Self-pay | Admitting: Cardiology

## 2023-07-03 ENCOUNTER — Other Ambulatory Visit: Payer: Self-pay

## 2023-07-03 DIAGNOSIS — I5022 Chronic systolic (congestive) heart failure: Secondary | ICD-10-CM

## 2023-07-03 DIAGNOSIS — E559 Vitamin D deficiency, unspecified: Secondary | ICD-10-CM

## 2023-07-03 DIAGNOSIS — Z95811 Presence of heart assist device: Secondary | ICD-10-CM

## 2023-07-03 DIAGNOSIS — I480 Paroxysmal atrial fibrillation: Secondary | ICD-10-CM

## 2023-07-06 ENCOUNTER — Other Ambulatory Visit: Payer: Self-pay

## 2023-07-06 ENCOUNTER — Other Ambulatory Visit (HOSPITAL_COMMUNITY): Payer: Self-pay | Admitting: Cardiology

## 2023-07-06 ENCOUNTER — Other Ambulatory Visit: Payer: Self-pay | Admitting: Nurse Practitioner

## 2023-07-06 DIAGNOSIS — E559 Vitamin D deficiency, unspecified: Secondary | ICD-10-CM

## 2023-07-06 DIAGNOSIS — Z95811 Presence of heart assist device: Secondary | ICD-10-CM

## 2023-07-06 DIAGNOSIS — I480 Paroxysmal atrial fibrillation: Secondary | ICD-10-CM

## 2023-07-06 DIAGNOSIS — I5022 Chronic systolic (congestive) heart failure: Secondary | ICD-10-CM

## 2023-07-07 ENCOUNTER — Other Ambulatory Visit: Payer: Self-pay

## 2023-07-07 ENCOUNTER — Encounter: Payer: Self-pay | Admitting: Nurse Practitioner

## 2023-07-07 ENCOUNTER — Ambulatory Visit (INDEPENDENT_AMBULATORY_CARE_PROVIDER_SITE_OTHER): Payer: HMO | Admitting: Nurse Practitioner

## 2023-07-07 VITALS — Ht 63.0 in | Wt 253.4 lb

## 2023-07-07 DIAGNOSIS — Z23 Encounter for immunization: Secondary | ICD-10-CM

## 2023-07-07 DIAGNOSIS — Z95811 Presence of heart assist device: Secondary | ICD-10-CM | POA: Diagnosis not present

## 2023-07-07 DIAGNOSIS — I5022 Chronic systolic (congestive) heart failure: Secondary | ICD-10-CM | POA: Diagnosis not present

## 2023-07-07 DIAGNOSIS — E559 Vitamin D deficiency, unspecified: Secondary | ICD-10-CM

## 2023-07-07 DIAGNOSIS — F410 Panic disorder [episodic paroxysmal anxiety] without agoraphobia: Secondary | ICD-10-CM

## 2023-07-07 DIAGNOSIS — R110 Nausea: Secondary | ICD-10-CM | POA: Diagnosis not present

## 2023-07-07 DIAGNOSIS — E785 Hyperlipidemia, unspecified: Secondary | ICD-10-CM | POA: Diagnosis not present

## 2023-07-07 DIAGNOSIS — E66813 Obesity, class 3: Secondary | ICD-10-CM

## 2023-07-07 LAB — LIPID PANEL
Cholesterol: 181 mg/dL (ref 0–200)
HDL: 81.6 mg/dL (ref >39.00)
LDL Cholesterol: 88 mg/dL (ref 0–99)
NonHDL: 99.04
Total CHOL/HDL Ratio: 2
Triglycerides: 53 mg/dL (ref 0.0–149.0)
VLDL: 10.6 mg/dL (ref 0.0–40.0)

## 2023-07-07 LAB — COMPREHENSIVE METABOLIC PANEL
ALT: 17 U/L (ref 0–35)
AST: 19 U/L (ref 0–37)
Albumin: 4.2 g/dL (ref 3.5–5.2)
Alkaline Phosphatase: 70 U/L (ref 39–117)
BUN: 12 mg/dL (ref 6–23)
CO2: 25 meq/L (ref 19–32)
Calcium: 9.5 mg/dL (ref 8.4–10.5)
Chloride: 104 meq/L (ref 96–112)
Creatinine, Ser: 1.25 mg/dL — ABNORMAL HIGH (ref 0.40–1.20)
GFR: 49.6 mL/min — ABNORMAL LOW (ref >60.00)
Glucose, Bld: 90 mg/dL (ref 70–99)
Potassium: 4 meq/L (ref 3.5–5.1)
Sodium: 138 meq/L (ref 135–145)
Total Bilirubin: 0.5 mg/dL (ref 0.2–1.2)
Total Protein: 7.5 g/dL (ref 6.0–8.3)

## 2023-07-07 LAB — VITAMIN D 25 HYDROXY (VIT D DEFICIENCY, FRACTURES): VITD: 26.72 ng/mL — ABNORMAL LOW (ref 30.00–100.00)

## 2023-07-07 MED ORDER — ZEPBOUND 5 MG/0.5ML ~~LOC~~ SOAJ
5.0000 mg | SUBCUTANEOUS | 0 refills | Status: DC
  Filled 2023-07-07: qty 2, 28d supply, fill #0

## 2023-07-07 MED ORDER — ONDANSETRON 4 MG PO TBDP
4.0000 mg | ORAL_TABLET | Freq: Three times a day (TID) | ORAL | 0 refills | Status: DC | PRN
  Filled 2023-07-07: qty 20, 7d supply, fill #0

## 2023-07-07 MED ORDER — ZEPBOUND 2.5 MG/0.5ML ~~LOC~~ SOAJ
2.5000 mg | SUBCUTANEOUS | 0 refills | Status: DC
  Filled 2023-07-07: qty 2, 28d supply, fill #0

## 2023-07-07 MED FILL — Quinidine Gluconate Tab ER 324 MG: ORAL | 30 days supply | Qty: 60 | Fill #0 | Status: CN

## 2023-07-07 NOTE — Assessment & Plan Note (Signed)
Closely followed by Cardiology. Has an LVAD. Needs cardiac transplant. Working on BMI reduction in order to have transplant. Encouraged to continue working on healthy diet and exercise. She will continue her current medication regimen. Follow up with Cardiology as scheduled.

## 2023-07-07 NOTE — Progress Notes (Signed)
 Leron Glance, NP-C Phone: [phone]  [name] is a 53 y.o. female who presents today for follow up.   Discussed the use of AI scribe software for clinical note transcription with the patient, who gave verbal consent to proceed.  History of Present Illness   The patient, with a history of heart failure, obesity, and hyperlipidemia, presents for a follow-up visit. She reports no weight loss despite being on Wegovy , which was increased to 1.7mg  about a month ago. She has been experiencing nausea for the past two days, with one episode of vomiting last night. The patient is unsure if the nausea is a side effect of the Wegovy  or a viral infection, as her granddaughter recently had multiple episodes of vomiting. She denies any fevers, abdominal pain or diarrhea. She has been drinking fluids as normal but has noticed a decreased appetite.  The patient has been adherent to her Crestor  medication for hyperlipidemia since the last visit, when her cholesterol levels were found to be elevated. She has also been taking a weekly high-dose Vitamin D  supplement for the past six months due to a previously low level.  Regarding her heart failure management, the patient has been off torsemide  due to its negative impact on her LVAD pump numbers. She has been advised to drink fluids as needed, guided by her pump's performance. Her last office visit with Cardiology was on 06/23/2023.   The patient also reports occasional use of Xanax  for anxiety, particularly in crowded situations.      Social History   Tobacco Use  Smoking Status Never  Smokeless Tobacco Never    Current Outpatient Medications on File Prior to Visit  Medication Sig Dispense Refill   ALPRAZolam  (XANAX ) 0.25 MG tablet Take 1 tablet (0.25 mg total) by mouth 2 (two) times daily as needed for anxiety. 20 tablet 2   amiodarone  (PACERONE ) 200 MG tablet Take 1 tablet (200 mg total) by mouth daily. 90 tablet 3   ASPIRIN  LOW DOSE 81 MG tablet  TAKE ONE TABLET BY MOUTH ONCE A DAY 30 tablet 11   dapagliflozin  propanediol (FARXIGA ) 10 MG TABS tablet Take 1 tablet (10 mg total) by mouth daily before breakfast. 90 tablet 3   estradiol  (CLIMARA  - DOSED IN MG/24 HR) 0.0375 mg/24hr patch Place 1 patch (0.0375 mg total) onto the skin every 7 (seven) days. 4 patch 11   potassium chloride  SA (KLOR-CON  M) 20 MEQ tablet Take 1 tablet (20 mEq total) by mouth daily. 270 tablet 3   quiniDINE gluconate  324 MG CR tablet Take 1 tablet (324 mg total) by mouth 2 (two) times daily. 60 tablet 6   rosuvastatin  (CRESTOR ) 40 MG tablet Take 1 tablet (40 mg total) by mouth daily. 90 tablet 3   sildenafil  (REVATIO ) 20 MG tablet Take 1 tablet (20 mg total) by mouth 3 (three) times daily. 90 tablet 6   spironolactone  (ALDACTONE ) 25 MG tablet Take 1 tablet (25 mg total) by mouth daily. 45 tablet 3   torsemide  (DEMADEX ) 20 MG tablet Take 1 tablet (20 mg total) by mouth every other day. Take 1 tablet (20 mg) by mouth as needed, or as directed by heart failure clinic. 180 tablet 3   Vitamin D , Ergocalciferol , (DRISDOL ) 1.25 MG (50000 UNIT) CAPS capsule Take 1 capsule (50,000 Units total) by mouth every 7 (seven) days. 13 capsule 1   warfarin (COUMADIN ) 2 MG tablet Take 4 mg (2 tabs) daily or as directed by LVAD Clinic. (Patient taking differently: Take 2  mg by mouth daily at 4 PM. Take 4 mg (2 tabs) daily except Tues/Thursday take 2 mg or as directed by LVAD Clinic.) 180 tablet 3   No current facility-administered medications on file prior to visit.    ROS see history of present illness  Objective  Physical Exam Vitals:    BP Readings from Last 3 Encounters:  06/23/23 98/64  05/26/23 109/79  04/28/23 (!) 116/59   Wt Readings from Last 3 Encounters:  07/07/23 253 lb 6.4 oz (114.9 kg)  06/23/23 256 lb (116.1 kg)  06/03/23 253 lb (114.8 kg)    Physical Exam Constitutional:      General: She is not in acute distress.    Appearance: Normal appearance.   HENT:     Head: Normocephalic.  Cardiovascular:     Comments: Mechanical heart sounds with LVAD present.  Pulmonary:     Effort: Pulmonary effort is normal. No respiratory distress.     Breath sounds: Normal breath sounds.  Abdominal:     General: Abdomen is flat. Bowel sounds are normal.     Palpations: Abdomen is soft.     Tenderness: There is no abdominal tenderness.  Skin:    General: Skin is warm and dry.  Neurological:     Mental Status: She is alert.  Psychiatric:        Mood and Affect: Mood normal.        Behavior: Behavior normal.    Assessment/Plan: Please see individual problem list.  Nausea Assessment & Plan: Possible viral gastroenteritis is suspected due to recent contact with a symptomatic granddaughter. Prescribe Zofran  for symptomatic relief. Advise staying hydrated and seeking medical attention if unable to keep fluids down, not urinating, or developing high fevers.  Orders: -     Ondansetron ; Take 1 tablet (4 mg total) by mouth every 8 (eight) hours as needed for nausea or vomiting.  Dispense: 20 tablet; Refill: 0  Obesity, Class III, BMI 40-49.9 (morbid obesity) (HCC) Assessment & Plan: Minimal weight loss has been observed despite taking Wegovy  1.7mg . We will attempt to switch from Wegovy  to Zepbound , pending insurance approval. Continue follow-up with [facility]. Counseled on the risk of pancreatitis and gallbladder disease. Discussed the risk of nausea. Advised to discontinue the Zepbound  and contact us  immediately if they develop abdominal pain. If they develop excessive nausea they will contact us  right away. I discussed that medullary thyroid  cancer has been seen in rats studies. The patient confirmed no personal or family history of thyroid  cancer, parathyroid cancer, or adrenal gland cancer. Discussed that we thus far have not seen medullary thyroid  cancer result from use of this type of medication in humans. Advised to monitor the  thyroid  area and contact us  for any lumps, swelling, trouble swallowing, or any other changes in this area.  Discussed goal weight loss of 1 to 2 pounds a week while on this medication. Discussed the importance of healthy diet, exercise and lifestyle modifications even with this medication.  Orders: -     Zepbound ; Inject 2.5 mg into the skin once a week.  Dispense: 2 mL; Refill: 0 -     Zepbound ; Inject 5 mg into the skin once a week.  Dispense: 2 mL; Refill: 0  Vitamin D  deficiency Assessment & Plan: Completed six months of high-dose Vitamin D  supplementation. Check Vitamin D  level today. If normalized, switch to over-the-counter daily supplementation.  Orders: -     VITAMIN D  25 Hydroxy (Vit-D Deficiency, Fractures)  Hyperlipidemia,  unspecified hyperlipidemia type Assessment & Plan: Previously inconsistent use of Crestor  40mg  daily has been corrected, and it is now taken regularly. Check lipid panel today.  Orders: -     Comprehensive metabolic panel -     Lipid panel  SYSTOLIC HEART FAILURE, CHRONIC Assessment & Plan: Closely followed by Cardiology. Has an LVAD. Needs cardiac transplant. Working on BMI reduction in order to have transplant. Encouraged to continue working on healthy diet and exercise. She will continue her current medication regimen. Follow up with Cardiology as scheduled.    LVAD (left ventricular assist device) present (HCC)  Panic attacks Assessment & Plan: Occasional use of Xanax  0.25mg  BID for situational anxiety. Continue current management.    Need for influenza vaccination -     Flu vaccine trivalent PF, 6mos and older(Flulaval,Afluria,Fluarix,Fluzone)   Return in about 6 months (around 01/04/2024) for Follow up.   Leron Glance, NP-C [facility]

## 2023-07-07 NOTE — Assessment & Plan Note (Signed)
 Possible viral gastroenteritis is suspected due to recent contact with a symptomatic granddaughter. Prescribe Zofran  for symptomatic relief. Advise staying hydrated and seeking medical attention if unable to keep fluids down, not urinating, or developing high fevers.

## 2023-07-07 NOTE — Assessment & Plan Note (Signed)
 Completed six months of high-dose Vitamin D supplementation. Check Vitamin D level today. If normalized, switch to over-the-counter daily supplementation.

## 2023-07-07 NOTE — Assessment & Plan Note (Signed)
 Occasional use of Xanax 0.25mg  BID for situational anxiety. Continue current management.

## 2023-07-07 NOTE — Assessment & Plan Note (Signed)
 Minimal weight loss has been observed despite taking Wegovy  1.7mg . We will attempt to switch from Wegovy  to Zepbound , pending insurance approval. Continue follow-up with [facility]. Counseled on the risk of pancreatitis and gallbladder disease. Discussed the risk of nausea. Advised to discontinue the Zepbound  and contact us  immediately if they develop abdominal pain. If they develop excessive nausea they will contact us  right away. I discussed that medullary thyroid  cancer has been seen in rats studies. The patient confirmed no personal or family history of thyroid  cancer, parathyroid cancer, or adrenal gland cancer. Discussed that we thus far have not seen medullary thyroid  cancer result from use of this type of medication in humans. Advised to monitor the thyroid  area and contact us  for any lumps, swelling, trouble swallowing, or any other changes in this area.  Discussed goal weight loss of 1 to 2 pounds a week while on this medication. Discussed the importance of healthy diet, exercise and lifestyle modifications even with this medication.

## 2023-07-07 NOTE — Assessment & Plan Note (Signed)
 Previously inconsistent use of Crestor 40mg  daily has been corrected, and it is now taken regularly. Check lipid panel today.

## 2023-07-08 ENCOUNTER — Other Ambulatory Visit: Payer: Self-pay

## 2023-07-09 ENCOUNTER — Ambulatory Visit (HOSPITAL_COMMUNITY): Payer: Self-pay | Admitting: Pharmacist

## 2023-07-09 ENCOUNTER — Ambulatory Visit (INDEPENDENT_AMBULATORY_CARE_PROVIDER_SITE_OTHER): Payer: HMO | Admitting: Family Medicine

## 2023-07-09 ENCOUNTER — Other Ambulatory Visit: Payer: Self-pay

## 2023-07-09 ENCOUNTER — Encounter (INDEPENDENT_AMBULATORY_CARE_PROVIDER_SITE_OTHER): Payer: Self-pay | Admitting: Family Medicine

## 2023-07-09 VITALS — HR 70 | Temp 97.6°F | Ht 63.0 in | Wt 253.0 lb

## 2023-07-09 DIAGNOSIS — E669 Obesity, unspecified: Secondary | ICD-10-CM | POA: Diagnosis not present

## 2023-07-09 DIAGNOSIS — Z6841 Body Mass Index (BMI) 40.0 and over, adult: Secondary | ICD-10-CM | POA: Diagnosis not present

## 2023-07-09 DIAGNOSIS — E559 Vitamin D deficiency, unspecified: Secondary | ICD-10-CM | POA: Diagnosis not present

## 2023-07-09 DIAGNOSIS — E785 Hyperlipidemia, unspecified: Secondary | ICD-10-CM

## 2023-07-09 LAB — POCT INR: INR: 3.6 — AB (ref 2.0–3.0)

## 2023-07-09 MED ORDER — VITAMIN D (ERGOCALCIFEROL) 1.25 MG (50000 UNIT) PO CAPS
50000.0000 [IU] | ORAL_CAPSULE | ORAL | 0 refills | Status: DC
  Filled 2023-07-09: qty 12, 84d supply, fill #0

## 2023-07-09 NOTE — Assessment & Plan Note (Signed)
Patient on Crestor daily.  Last LDL slightly improved from prior lab (labs done 1/14//25 reviewed)

## 2023-07-09 NOTE — Progress Notes (Signed)
   SUBJECTIVE:  Chief Complaint: Obesity  Interim History: patient had a good holiday season- had family over.  She stayed in for New Years.  She feels like she did well following her meal plan.   She feels a bit frustrated due to maintenance.  She is able to exercise and do what her body allows her to do.  She has been doing some whole grain rice and vegetables and fish.  She is getting in around 6 oz of protein at supper. She is doing yasso bars and rice and walnuts for snack calories. She has been sick the last couple of days and hasn't been eating much.  [name] is here to discuss her progress with her obesity treatment plan. She is on the Category 3 Plan and states she is following her eating plan approximately 85 % of the time. She states she is light exercising.   OBJECTIVE: Visit Diagnoses: Problem List Items Addressed This Visit       Other   Vitamin D deficiency   Recent Vitamin D lab reviewed.  Discussed importance of vitamin d supplementation.  Vitamin d supplementation has been shown to decrease fatigue, decrease risk of progression to insulin resistance and then prediabetes, decreases risk of falling in older age and can even assist in decreasing depressive symptoms in PTSD.         Relevant Medications   Vitamin D, Ergocalciferol, (DRISDOL) 1.25 MG (50000 UNIT) CAPS capsule   Hyperlipidemia - Primary   Patient on Crestor daily.  Last LDL slightly improved from prior lab (labs done 1/14//25 reviewed)         Vitals BP: (!) 109/93 (map 100) Pulse Rate: 75 SpO2: 0 % (UTO)   Anthropometric Measurements Weight: 255 lb 9.6 oz (115.9 kg)      08/03/2023   10:05 AM 08/03/2023   10:00 AM 07/09/2023   11:00 AM  Vitals with BMI  Height   5\' 3"   Weight  255 lbs 10 oz 253 lbs  BMI   44.83  Systolic 109 86   Diastolic 93 0   Pulse  75 70    No data recorded  No data recorded    ASSESSMENT AND PLAN:  Diet: [name] is currently in the action stage of change. As  such, her goal is to continue with weight loss efforts. She has agreed to Category 3 Plan.  Exercise: [name] has been instructed that some exercise is better than none for weight loss and overall health benefits.   Behavior Modification:  We discussed the following Behavioral Modification Strategies today: increasing lean protein intake, increasing vegetables, no skipping meals, meal planning and cooking strategies, and keeping healthy foods in the home.   No follow-ups on file.Marland Kitchen She was informed of the importance of frequent follow up visits to maximize her success with intensive lifestyle modifications for her multiple health conditions.  Attestation Statements:   Reviewed by clinician on day of visit: allergies, medications, problem list, medical history, surgical history, family history, social history, and previous encounter notes.    Reuben Likes, MD

## 2023-07-09 NOTE — Assessment & Plan Note (Signed)
Recent Vitamin D lab reviewed.  Discussed importance of vitamin d supplementation.  Vitamin d supplementation has been shown to decrease fatigue, decrease risk of progression to insulin resistance and then prediabetes, decreases risk of falling in older age and can even assist in decreasing depressive symptoms in PTSD.

## 2023-07-10 ENCOUNTER — Telehealth (HOSPITAL_COMMUNITY): Payer: Self-pay | Admitting: Pharmacy Technician

## 2023-07-10 ENCOUNTER — Other Ambulatory Visit (HOSPITAL_COMMUNITY): Payer: Self-pay

## 2023-07-10 NOTE — Telephone Encounter (Signed)
Patient Advocate Encounter   Received notification from [employer] that prior authorization for Sildenafil is required.   PA submitted on CoverMyMeds Key [id number] Status is pending   Will continue to follow.

## 2023-07-10 NOTE — Telephone Encounter (Signed)
Advanced Heart Failure Patient Advocate Encounter  Prior Authorization for Sildenafil has been approved.    PA# [id number] Effective dates: 07/10/23 through 01/07/24  Archer Asa, CPhT

## 2023-07-13 ENCOUNTER — Other Ambulatory Visit: Payer: Self-pay

## 2023-07-15 ENCOUNTER — Other Ambulatory Visit: Payer: Self-pay

## 2023-07-16 ENCOUNTER — Ambulatory Visit (HOSPITAL_COMMUNITY): Payer: Self-pay | Admitting: Pharmacist

## 2023-07-16 ENCOUNTER — Other Ambulatory Visit: Payer: Self-pay

## 2023-07-16 LAB — POCT INR: INR: 2.8 (ref 2.0–3.0)

## 2023-07-17 ENCOUNTER — Telehealth (HOSPITAL_COMMUNITY): Payer: Self-pay | Admitting: Pharmacy Technician

## 2023-07-17 ENCOUNTER — Telehealth: Payer: Self-pay | Admitting: Internal Medicine

## 2023-07-17 ENCOUNTER — Other Ambulatory Visit: Payer: Self-pay

## 2023-07-17 ENCOUNTER — Other Ambulatory Visit (HOSPITAL_COMMUNITY): Payer: Self-pay

## 2023-07-17 NOTE — Telephone Encounter (Signed)
Advanced Heart Failure Patient Advocate Encounter  Received a notification from [facility] outpatient pharmacy that the grant for Quinidine is not working. After some investigation, the TAF grant is no longer covering that medication. There are currently no other available options for assistance for this medication. Called and spoke with the patient. She confirmed that she can afford the $100 co-pay. Patient is aware that we can assist with an AR account if she cannot afford the medication, at any time. Updated Pam [facility]) at [facility].  Archer Asa, CPhT

## 2023-07-17 NOTE — Telephone Encounter (Signed)
Spoke with Pam over at the [facility] pharmacy and they do not need any further assistance at this time. Speciality pharmacy is working on trying to get her a lower cost and for now patient is going to pay the copay while they work on the cost. No further needs

## 2023-07-17 NOTE — Telephone Encounter (Signed)
Pt c/o medication issue:  1. Name of Medication:   quiniDINE gluconate 324 MG CR tablet    2. How are you currently taking this medication (dosage and times per day)?  Take 1 tablet (324 mg total) by mouth 2 (two) times daily.       3. Are you having a reaction (difficulty breathing--STAT)? No  4. What is your medication issue? Pam from Pharmacy is requesting a callback regarding patient assistance on this medication. Please advise

## 2023-07-23 ENCOUNTER — Ambulatory Visit (HOSPITAL_COMMUNITY): Payer: Self-pay | Admitting: Pharmacist

## 2023-07-23 LAB — POCT INR: INR: 2.1 (ref 2.0–3.0)

## 2023-07-24 ENCOUNTER — Other Ambulatory Visit: Payer: Self-pay

## 2023-07-30 ENCOUNTER — Ambulatory Visit (HOSPITAL_COMMUNITY): Payer: Self-pay | Admitting: Pharmacist

## 2023-07-30 ENCOUNTER — Telehealth (HOSPITAL_COMMUNITY): Payer: Self-pay | Admitting: Unknown Physician Specialty

## 2023-07-30 ENCOUNTER — Other Ambulatory Visit (HOSPITAL_COMMUNITY): Payer: Self-pay | Admitting: Unknown Physician Specialty

## 2023-07-30 DIAGNOSIS — Z95811 Presence of heart assist device: Secondary | ICD-10-CM

## 2023-07-30 DIAGNOSIS — Z7901 Long term (current) use of anticoagulants: Secondary | ICD-10-CM

## 2023-07-30 LAB — POCT INR: INR: 2.3 (ref 2.0–3.0)

## 2023-07-30 NOTE — Telephone Encounter (Signed)
 Spoke with [name] today and reminded him of appt Monday 2/10 @ 0900am. [name] instructed to bring all of equipment including UBC, MPU and back up bag with back up controller for her 2 year annual maintenance. [name] verbalized understanding and will comply.   Miranda Ip RN, BSN VAD Coordinator 24/7 Pager [phone]

## 2023-08-03 ENCOUNTER — Ambulatory Visit (HOSPITAL_COMMUNITY)
Admission: RE | Admit: 2023-08-03 | Discharge: 2023-08-03 | Disposition: A | Discharge status: Home / Self Care | Payer: PPO | Source: Ambulatory Visit | Attending: Cardiology | Admitting: Cardiology

## 2023-08-03 ENCOUNTER — Ambulatory Visit (HOSPITAL_COMMUNITY): Payer: Self-pay | Admitting: Pharmacist

## 2023-08-03 DIAGNOSIS — I5022 Chronic systolic (congestive) heart failure: Secondary | ICD-10-CM | POA: Diagnosis not present

## 2023-08-03 DIAGNOSIS — Z95811 Presence of heart assist device: Secondary | ICD-10-CM | POA: Insufficient documentation

## 2023-08-03 DIAGNOSIS — Z7982 Long term (current) use of aspirin: Secondary | ICD-10-CM | POA: Diagnosis not present

## 2023-08-03 DIAGNOSIS — I493 Ventricular premature depolarization: Secondary | ICD-10-CM | POA: Diagnosis not present

## 2023-08-03 DIAGNOSIS — N183 Chronic kidney disease, stage 3 unspecified: Secondary | ICD-10-CM | POA: Diagnosis not present

## 2023-08-03 DIAGNOSIS — I472 Ventricular tachycardia, unspecified: Secondary | ICD-10-CM | POA: Diagnosis not present

## 2023-08-03 DIAGNOSIS — Z4509 Encounter for adjustment and management of other cardiac device: Secondary | ICD-10-CM | POA: Diagnosis not present

## 2023-08-03 DIAGNOSIS — I428 Other cardiomyopathies: Secondary | ICD-10-CM | POA: Insufficient documentation

## 2023-08-03 DIAGNOSIS — I130 Hypertensive heart and chronic kidney disease with heart failure and stage 1 through stage 4 chronic kidney disease, or unspecified chronic kidney disease: Secondary | ICD-10-CM | POA: Insufficient documentation

## 2023-08-03 DIAGNOSIS — Z7901 Long term (current) use of anticoagulants: Secondary | ICD-10-CM | POA: Insufficient documentation

## 2023-08-03 DIAGNOSIS — I4891 Unspecified atrial fibrillation: Secondary | ICD-10-CM | POA: Insufficient documentation

## 2023-08-03 DIAGNOSIS — Z95810 Presence of automatic (implantable) cardiac defibrillator: Secondary | ICD-10-CM | POA: Diagnosis not present

## 2023-08-03 DIAGNOSIS — Z8673 Personal history of transient ischemic attack (TIA), and cerebral infarction without residual deficits: Secondary | ICD-10-CM | POA: Diagnosis not present

## 2023-08-03 DIAGNOSIS — D631 Anemia in chronic kidney disease: Secondary | ICD-10-CM | POA: Insufficient documentation

## 2023-08-03 DIAGNOSIS — I340 Nonrheumatic mitral (valve) insufficiency: Secondary | ICD-10-CM | POA: Diagnosis not present

## 2023-08-03 DIAGNOSIS — E0590 Thyrotoxicosis, unspecified without thyrotoxic crisis or storm: Secondary | ICD-10-CM | POA: Diagnosis not present

## 2023-08-03 LAB — COMPREHENSIVE METABOLIC PANEL
ALT: 22 U/L (ref 0–44)
AST: 22 U/L (ref 15–41)
Albumin: 3.6 g/dL (ref 3.5–5.0)
Alkaline Phosphatase: 63 U/L (ref 38–126)
Anion gap: 10 (ref 5–15)
BUN: 16 mg/dL (ref 6–20)
CO2: 23 mmol/L (ref 22–32)
Calcium: 9.2 mg/dL (ref 8.9–10.3)
Chloride: 105 mmol/L (ref 98–111)
Creatinine, Ser: 1.5 mg/dL — ABNORMAL HIGH (ref 0.44–1.00)
GFR, Estimated: 42 mL/min — ABNORMAL LOW (ref >60)
Glucose, Bld: 83 mg/dL (ref 70–99)
Potassium: 3.9 mmol/L (ref 3.5–5.1)
Sodium: 138 mmol/L (ref 135–145)
Total Bilirubin: 0.7 mg/dL (ref 0.0–1.2)
Total Protein: 7.4 g/dL (ref 6.5–8.1)

## 2023-08-03 LAB — CBC WITH DIFFERENTIAL/PLATELET
Abs Immature Granulocytes: 0.04 10*3/uL (ref 0.00–0.07)
Basophils Absolute: 0 10*3/uL (ref 0.0–0.1)
Basophils Relative: 0 %
Eosinophils Absolute: 0 10*3/uL (ref 0.0–0.5)
Eosinophils Relative: 0 %
HCT: 44.8 % (ref 36.0–46.0)
Hemoglobin: 14 g/dL (ref 12.0–15.0)
Immature Granulocytes: 1 %
Lymphocytes Relative: 19 %
Lymphs Abs: 1.6 10*3/uL (ref 0.7–4.0)
MCH: 25.7 pg — ABNORMAL LOW (ref 26.0–34.0)
MCHC: 31.3 g/dL (ref 30.0–36.0)
MCV: 82.4 fL (ref 80.0–100.0)
Monocytes Absolute: 0.8 10*3/uL (ref 0.1–1.0)
Monocytes Relative: 10 %
Neutro Abs: 5.9 10*3/uL (ref 1.7–7.7)
Neutrophils Relative %: 70 %
Platelets: 223 10*3/uL (ref 150–400)
RBC: 5.44 MIL/uL — ABNORMAL HIGH (ref 3.87–5.11)
RDW: 15.8 % — ABNORMAL HIGH (ref 11.5–15.5)
WBC: 8.4 10*3/uL (ref 4.0–10.5)
nRBC: 0 % (ref 0.0–0.2)

## 2023-08-03 LAB — PROTIME-INR
INR: 2.3 — ABNORMAL HIGH (ref 0.8–1.2)
Prothrombin Time: 25.9 s — ABNORMAL HIGH (ref 11.4–15.2)

## 2023-08-03 LAB — PREALBUMIN: Prealbumin: 21 mg/dL (ref 18–38)

## 2023-08-03 LAB — LACTATE DEHYDROGENASE: LDH: 174 U/L (ref 98–192)

## 2023-08-03 NOTE — Patient Instructions (Signed)
No medication changes today Coumadin dosing per [name] Return to clinic in 2 months for follow up with Dr [name]

## 2023-08-03 NOTE — Progress Notes (Addendum)
 Patient presented for 2 month f/u with 2 year Intermacs and annual maintenance in VAD Clinic today alone. Reports no problems with VAD equipment or concerns with drive line.  States she has been feeling a lot better with recent speed adjustment to 5800. Denies lightheadedness, dizziness, falls, shortness of breath, heart failure symptoms, and signs of bleeding.   Pt is participating in healthy weight and wellness program and has been able to increase her exercise routine without any limitation. Her insurance is no longer covering Wegovy . PCP office is working on prior authorization for Zepbound . Weight stable since last visit.   Amiodarone  restarted in February 2024 due to VT/VF episode Tolerating Amiodarone  200 mg daily. She has not taken Digoxin  since 2023.    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 Pt had follow up with Endocrinology 06/02/23. TSH trending down, decision was made to hold off on starting Synthroid  as long as TFTs improving. Next endocrinology visit 10/01/23.   Vital Signs:  Doppler Pressure: 86  Automatic BP: 109/93 (100) HR: 75 SR  SPO2: UTO   Weight: 255.6 lbs w/o eqt Last weight: 255.4 lb w/o eqt  VAD Indication: Destination Therapy d/t BMI   VAD interrogation & Equipment Management: Speed: 5800 Flow: 5.3 Power: 4.3 w    PI: 4.1   Alarms: none Events: >100 daily  Fixed speed: 5800 Low speed limit: 5500   Primary Controller:  Replace back up battery in 8 months. Back up controller:   Replace back up battery in 8 months.   Annual Equipment Maintenance on UBC/PM was performed on 08/03/23.    I reviewed the LVAD parameters from today and compared the results to the patient's prior recorded data. LVAD interrogation was NEGATIVE for significant power changes, NEGATIVE for clinical alarms and STABLE for PI events/speed drops. No programming changes were made and pump is functioning within specified parameters. Pt is performing daily  controller and system monitor self tests along with completing weekly and monthly maintenance for LVAD equipment.   LVAD equipment check completed and is in good working order. Back-up equipment present. Charged back up battery and performed self-test on equipment.    Exit Site Care: CDI. Drive line anchor secure. Pt denies fever or chills. Provided with 8 weekly kits and 4 boxes large tegaderms for home use.     Significant Events on VAD Support:  N/A  Device: Medtronic Therapies: on VF 240 VT 200 VVI: 40 Last check: 06/23/23   BP & Labs:  Doppler 86- Doppler correlated with MAP   Hgb 14.0 - No S/S of bleeding. Specifically denies vaginal bleeding, melena/BRBPR or nosebleeds.    LDH stable at 174 with established baseline of 250 - 450. Denies tea-colored urine. No power elevations noted on interrogation.    Batteries Manufacture Date: Number of uses: Re-calibration  07/29/21 35-45 Performed by patient   Annual maintenance completed per Biomed on patient's home MPU and universal Magazine features editor.    Backup system controller 11 volt battery charged during visit.   2 year Intermacs follow up completed including:  Quality of Life, KCCQ-12, and Neurocognitive trail making.   Pt completed 1400 feet during 6 minute walk.  Patient Goals: To continue weight loss journey.   Plan:  No medication changes today Coumadin  dosing per [name] Return to clinic in 2 months for follow up with Dr Gerianne Kobs RN VAD Coordinator  Office: [phone]  24/7 Pager: [phone]   Chief Complaint: LVAD followup, CHF  History of Present Illness  [name] is a 53 y.o. 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  She was followed for systolic CHF initially in [place]. Apparently her EF recovered to 35-40%. However, she was admitted to [facility] 09/19/13 for  worsening dyspnea and CP. Coronaries were reportedly OK on LHC at that time at [facility]. Echo showed EF of 15-20% with four-chamber enlargement with moderate-severe TR/MR. RHC as below. Rheumatological w/u negative.  CPX in 9/16 showed near-normal functional capacity.  Repeat echo in 6/17 shows that EF remains 20%. Repeat CPX in 1/18 was submaximal but probably only mild circulatory limitation.  Echo 8/18 showed EF 25-30%, severe LV dilation.  CPX in 6/19 showed low normal functional capacity.  Echo in 10/19 showed EF 20-25%, moderate LV dilation.    Zio patch in 2/20 with 11% PVCs and NSVT, amiodarone  started. Zio patch in 3/20 with 13.3% PVCs and NSVT.  She saw Dr. Rodolfo Clan, he thought that increased PVCs were electromechanical in nature due to worsening heart failure.  Repeat Zio patch on amiodarone  in 1/21 showed 6.2% PVCs.   She was admitted in 4/20 with VT in setting of hypokalemia.    CPX in 9/20 showed mild functional limitation, more due to body habitus than heart failure.  Echo in 4/21 showed EF 20% with severe LV dilation, diffuse hypokinesis, normal RV size and systolic function.   In 8/21, she had a shock for VF in the setting of hypokalemia.   Patient was admitted in 10/21 with CVA involving left MCA territory.  This was thought to be cardioembolic but LV thrombus was not visualized and no atrial fibrillation has been seen.  Echo in 10/21 showed EF 20-25%, severe LV dilation (no LV thrombus seen), mildly decreased RV systolic function, mild-moderate MR. She was taken by IR for thrombectomy and stent placement, now on ASA 81 and ticagrelor .   She had recurrent VT in 2/22, quinidine was added by EP.  Spironolactone  and Coreg  were decreased due to low BP and lightheadedness.    Echo in 4/22 showed EF 25% with severe LV dilation and global hypokinesis, normal RV, mild-moderate MR.   CPX (8/22) showed peak VO2 14.3, VE/VCO2 slope 42, RER 1.09 => mild to moderate HF limitation, elevated VE/VCO2  slope is likely false from end exercise hyperventilation. Restrictive lung physiology from obesity.   Patient was admitted in 3/23 with recurrent CHF exacerbation with low cardiac output.  She was also noted to be hyperthyroidism but amiodarone  had to be continued due to VT and poorly tolerated atrial fibrillation.  She required Impella 5.5 placement and eventual HM3 LVAD placement on 09/04/21.  Post-op course complicated by atrial fibrillation, malnutrition, and deconditioning.  She went to CIR prior to discharge.   Patient developed heavy vaginal bleeding in 7/23, she was seen in the ER and required 2 units PRBCs.  She was admitted in 8/23 and had hysterectomy + BSO, LVAD speed increased to 5900 rpm on ramp echo.  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    The patient was admitted overnight in 12/23 with severe posterior epistaxis requiring nasal pacing.   In 2/24, she had VF with shock.  Amiodarone  was restarted.   Echo in 7/24 showed  EF < 20, mild RV dysfunction with normal size, interventricular septum shifted mildly to right.   At a previous  visit in 12/2022, her VAD speed was increased to 6000. She took a torsemide  a day before presenting to clinic in 02/2023 and noted an increased frequency of PI events despite drinking water . Her speed was reduced to 5900 rpm. She continued to have suction events and speed was decreased to 5700 rpm in 11/24.   Ramp echo was done in 12/24, septum bowed to right at 5700 rpm, so speed increased to 5800 rpm.  The aortic valve opened each beat at 5800 rpm with moderate RV dysfunction/normal RV size.   She returns for followup of LVAD.  Weight is unchanged.  Unable to get coverage currently for Mounjaro , trying to get Zepbound . Doing well symptomatically, no exertional dyspnea.  Doing some walking for exercise.  No lightheadedness.  No chest pain.  She is helping with renovations at her mother's house so staying very active. She  has not been using torsemide .    LVAD interrogation: Personally reviewed.  Please see LVAD nursing note above.  Frequent PI events but similar to prior.  No low flow events.   Labs: (11/09/13): Dig level 0.5, K 3.6, Creatinine 0.83, pro-BNP 622 (12/19/13): K 3.6, creatinine 0.8 (7/15): K 4.1, creatinine 0.91 (8/15): K 3.9, creatinine 0.91 (10/15): K 4, creatinine 0.93, proBNP 129 (12/15): K 4, creatinine 0.9, digoxin  0.4 (5/16): K 4, creatinine 0.86 (9/16): K 3.6, creatinine 0.86, digoxin  < 0.2 (5/17): K 4.1, creatinine 0.95, digoxin  0.3 (5/18): K 3.4, creatinine 0.95, TSH elevated but free T3 and free T4 normal.  (8/18): K 3.9, creatinine 0.87, BNP 108  (11/18): K 3.5, creatinine 1.04, digoxin  0.4 (1/19): LDL 91, HDL 47 (3/19): digoxin  0.7, K 4, creatinine 0.99 (4/19): TSH normal, LDL 95 (9/19): LDL 99, HDL 45, K 4, creatinine 0.96 (10/19): K 3.6, creatinine 0.85, digoxin  0.5 (2/20): LDL 103 (3/20): K 3.9, creatinine 1.05 (4/20): K 3.9, creatinine 1.23 => 1.15, TSH 8, digoxin  0.3, LFTs normal 6/20): K 4.3, creatinine 1.07, LFTs normal, digoxin  0.5 (7/20): K 3.8, creatinine 1.16 (8/20): digoxin  < 0.2, LDL 135 (1/21): K 3.9, creatinine 1.05, LDL 171  (4/21): K 4.2, creatinine 0.95, LDL 127, TSH normal, LFTs normal (10/21): K 2.8, creatinine 1.21, LDL 130 (11/21): K 4.5, creatinine 1.3 => 1.2, LFTs normal, digoxin  1.3, TSH normal (3/22): K 4.1, creatinine 1.36 (4/22): hgb 10.8 (7/22): Transferrin 8%, digoxin  0.9, hgb 10.1, K 3.9, creatinine 1.42 (8/22): digoxin  0.4 (9/22): K 4.2, creatinine 1.32 (4/23): hgb 11, WBCs 13, K 3.9 => 3.8, creatinine 0.9 => 0.98, albumin  3.2, ALT 45, AST 31, LDH 272 => 244, TSH 0.235, free T4 1.84 (7/23): hgb 8, TSH normal 8/23): K 3.8, creatinine 1.15, hgb 11.4 (9/23): K 3.7, creatinine 1.36 (10/23): K 3.6, creatinine 1.42 (11/23): TSH normal (1/24): K 3.9, creatinine 1.17 (2/24): TSH 4.75, hgb 11.3, K 3.4, creatinine 1.35, LFTs normal (3/24):  LFTs normal (5/24): K 4, creatinine 1.4, hgb 12.5, LDH 193 (7/24): K 4, creatinine 1.3, LFTs normal, hgb 12.3, LDL 115, LDH 221 (12/24): K 4.1, creatinine 1.24, LDH 185, hgb 14.4 (1/25): K 4, creatinine 1.25, LFTs normal, LDL 88  PMH: 1. OSA: Using CPAP.  2. PVCs:  - Zio patch 2/20: 11% PVCs - Zio patch 3/20: 13.3% PVCs - Zio patch 1/21: 6.2% PVCs 3. VT/VF: 8/21, in setting of hypokalemia.  2/22 VT, quinidine added.  - VT 2/24, restarted amiodarone  4. 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  No LV thrombus visualized and atrial fibrillation has not been visualized.  - IR thrombectomy with stent placement in  10/21.  5. Chronic systolic CHF: Nonischemic cardiomyopathy:   - Coronary angiography 2015 at [facility]: Normal coronaries.  - Echo (8/15) with EF 20%, moderate LV dilation, diffuse hypokinesis, normal RV size with mildly decreased systolic function, mild MR.  - Echo (5/16) with EF 20%, spherical LV with diffuse hypokinesis, mild MR.  - Echo (6/17) with EF 20%, diffuse hypokinesis, moderate LV dilation, normal RV, mild to moderate MR.  - RHC (12/17): mean RA 5, PA 56/31, mean PCWP 24, CI 2, PVR 3.7 WU - Echo (8/18) with EF 25-30%, severe LV dilation, severe LAE.  - Echo (2/20) with EF 20-25%, moderate LV dilation, moderate MR.  - Echo (4/21) with EF 20-25%, severe LV dilation, mild-moderate MR, normal RV, PASP 26 mmHg.  - Echo (10/21) with EF 20-25%, severe LV dilation (no LV thrombus seen), mildly decreased RV systolic function, mild-moderate MR. - CPX in 9/20 showed mild functional limitation, more due to body habitus than heart failure.  - CPX in 8/21: Peak VO2 11.6, VE/VCO2 32, RER 1.2.  Mild HF limitation.  - Echo (4/22): EF 25% with severe LV dilation and global hypokinesis, normal RV, mild-moderate MR.  - CPX (8/22): peak VO2 14.3, VE/VCO2 slope 42, RER 1.09 => mild to moderate HF limitation, elevated VE/VCO2 slope is likely false from end exercise hyperventilation. Restrictive lung  physiology from obesity.  - HM3 LVAD placement 09/04/21.  - Echo (7/24): EF < 20, mild RV dysfunction with normal size, interventricular septum shifted mildly to right. - Ramp echo (12/24): Speed increased to 5800 rpm.  Aortic valve opens each beat, moderate RV dysfunction with normal RV size.  6. PFTs (1/22): Near normal.  7. Fe deficiency anemia 8. 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  10. Left ovarian mass -  hysterectomy + BSO.  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      Current Outpatient Medications  Medication Sig Dispense Refill   ALPRAZolam  (XANAX ) 0.25 MG tablet Take 1 tablet (0.25 mg total) by mouth 2 (two) times daily as needed for anxiety. 20 tablet 2   amiodarone  (PACERONE ) 200 MG tablet Take 1 tablet (200 mg total) by mouth daily. 90 tablet 3   ASPIRIN  LOW DOSE 81 MG tablet TAKE ONE TABLET BY MOUTH ONCE A DAY 30 tablet 11   dapagliflozin  propanediol (FARXIGA ) 10 MG TABS tablet Take 1 tablet (10 mg total) by mouth daily before breakfast. 90 tablet 3   estradiol  (CLIMARA  - DOSED IN MG/24 HR) 0.0375 mg/24hr patch Place 1 patch (0.0375 mg total) onto the skin every 7 (seven) days. 4 patch 11   quiniDINE gluconate  324 MG CR tablet Take 1 tablet (324 mg total) by mouth 2 (two) times daily. 60 tablet 6   rosuvastatin  (CRESTOR ) 40 MG tablet Take 1 tablet (40 mg total) by mouth daily. 90 tablet 3   sildenafil  (REVATIO ) 20 MG tablet Take 1 tablet (20 mg total) by mouth 3 (three) times daily. 90 tablet 6   spironolactone  (ALDACTONE ) 25 MG tablet Take 1 tablet (25 mg total) by mouth daily. 45 tablet 3   warfarin (COUMADIN ) 2 MG tablet Take 4 mg (2 tabs) daily or as directed by LVAD Clinic. (Patient taking differently: Take 2 mg by mouth daily at 4 PM.  2 mg (2 mg x 1) every Tue, Thu, Sat; 4 mg (2 mg x 2) all other days or as directed by LVAD Clinic.) 180 tablet 3   ondansetron  (  ZOFRAN -ODT) 4 MG  disintegrating tablet Take 1 tablet (4 mg total) by mouth every 8 (eight) hours as needed for nausea or vomiting. (Patient not taking: Reported on 08/03/2023) 20 tablet 0   potassium chloride  SA (KLOR-CON  M) 20 MEQ tablet Take 1 tablet (20 mEq total) by mouth daily. (Patient not taking: Reported on 08/03/2023) 270 tablet 3   tirzepatide  (ZEPBOUND ) 2.5 MG/0.5ML Pen Inject 2.5 mg into the skin once a week. (Patient not taking: Reported on 08/03/2023) 2 mL 0   tirzepatide  (ZEPBOUND ) 5 MG/0.5ML Pen Inject 5 mg into the skin once a week. (Patient not taking: Reported on 08/03/2023) 2 mL 0   torsemide  (DEMADEX ) 20 MG tablet Take 1 tablet (20 mg total) by mouth every other day. Take 1 tablet (20 mg) by mouth as needed, or as directed by heart failure clinic. (Patient not taking: Reported on 08/03/2023) 180 tablet 3   Vitamin D , Ergocalciferol , (DRISDOL ) 1.25 MG (50000 UNIT) CAPS capsule Take 1 capsule (50,000 Units total) by mouth every 7 (seven) days. (Patient not taking: Reported on 08/03/2023) 12 capsule 0   No current facility-administered medications for this encounter.    Allergies:   Patient has no known allergies.   Social History:  The patient  reports that she has never smoked. She has never used smokeless tobacco. She reports that she does not drink alcohol and does not use drugs.   Family History:  The patient's family history includes Heart Problems in her mother; Heart disease in her maternal grandmother and mother; High blood pressure in her father; Hypertension in her father and mother; Obesity in her mother; Stroke in her father.   ROS:  Please see the history of present illness.   All other systems are personally reviewed and negative.   Exam:    MAP 90 General: Well appearing this am. NAD.  HEENT: Normal. Neck: Supple, JVP 7-8 cm. Carotids OK.  Cardiac:  Mechanical heart sounds with LVAD hum present.  Lungs:  CTAB, normal effort.  Abdomen:  NT, ND, no HSM. No bruits or masses. +BS   LVAD exit site: Well-healed and incorporated. Dressing dry and intact. No erythema or drainage. Stabilization device present and accurately applied. Driveline dressing changed daily per sterile technique. Extremities:  Warm and dry. No cyanosis, clubbing, rash, or edema.  Neuro:  Alert & oriented x 3. Cranial nerves grossly intact. Moves all 4 extremities w/o difficulty. Affect pleasant    Recent Labs: 03/17/2023: Magnesium  1.8 06/02/2023: TSH 5.03 08/03/2023: ALT 22; BUN 16; Creatinine, Ser 1.50; Hemoglobin 14.0; Platelets 223; Potassium 3.9; Sodium 138  Personally reviewed   Wt Readings from Last 3 Encounters:  08/03/23 115.9 kg (255 lb 9.6 oz)  07/09/23 114.8 kg (253 lb)  07/07/23 114.9 kg (253 lb 6.4 oz)    ASSESSMENT AND PLAN:   1. Chronic systolic HF: Nonischemic cardiomyopathy.  She has a Medtronic ICD. Cause for cardiomyopathy is uncertain, initially identified peri-partum (after son's birth), so cannot rule out peri-partum CMP.  On and off, she has had frequent PVCs.   RHC in 8/15 showed normal filling pressures and preserved cardiac output. Echo in 4/22 with EF 25%, severe LV dilation.  CPX (8/22) with mild-moderate HF limitation.  Echo in 3/23 with EF < 20% with severe dilation, normal RV size with mildly decreased systolic function, moderate-severe functional MR with dilated IVC. 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 Impella 5.5 placed 08/29/21.  Patient had HM3 LVAD placed 09/04/21 and speed adjusted by ramp echo up to 5800 rpm and again up to 5900 rpm. In 7/24, LVAD speed increased to 6000 rpm under echo guidance. Developed PI and suction events, speed decreased to 5700 rpm.  In 12/24, ramp echo done and speed increased to 5800 rpm; the aortic valve opened each beat and the septum was midline.  NYHA class I currently with stable weight, not  volume overloaded on exam. LVAD parameters stable.    - She can continue to use torsemide  prn.   - Continue Farxiga  10 mg daily.  - Continue spironolactone  25 mg daily.  BMET today.  - She should continue ASA 81 mg daily with history of CVA.  - Continue sildenafil  20 mg tid  - Warfarin for VAD, goal INR 2-2.5.   2. Hyperthyroidism: Has history of hypothroidism thought to be associated with amiodarone .  She had been on Levoxyl  at home prior to 3/23 admission, noted to be hyperthyroid in 3/23.  This likely contributes to her CHF. She had recurrent VT and poorly tolerated AF which made it difficult for us  to stop amiodarone . Initially treated w/ IV Solumedrol -> transitioned to prednisone  and methimazole .  Endocrinology following, prednisone  has been titrated off, now titrated off methimazole .  We had to restart her on amiodarone  with VF.  - Continue amiodarone  200 daily, recent LFTs normal and will need regular eye exam.  3. PVCs/VT: EP consulted given complicated situation with hyperthyroidism and VT. She had a VF episode and amiodarone  was restarted.  - Continue quinidine.  - Continue amiodarone , follow TSH closely.  4. CVA: CVA in 10/21, had thrombectomy with stent placement.  No LV thrombus noted and no atrial fibrillation noted.  - Would continue ASA 81 daily.   5. Anemia: CBC today.  6. CKD stage 3: Follow creatinine closely.  7. Mitral regurgitation: Moderate-severe functional MR pre-LVAD.  Unlikely to be Mitraclip candidate with severely dilated LV (>7 cm). Mitral regurgitation was mild on 7/24 echo and 12/24 echo.  8. Left ovarian mass: Hysterectomy + BSO.  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   9. Atrial fibrillation: Tolerates poorly.  NSR today.   Followup in 2 months.   I spent 41 minutes reviewing records and LVAD interrogation, examining/interviewing patient, and organizing orders.    Peder Bourdon, MD  08/03/2023  [facility] [place] [address] [facility] [place] [address] [phone] (office) [phone] (fax)

## 2023-08-04 ENCOUNTER — Ambulatory Visit (INDEPENDENT_AMBULATORY_CARE_PROVIDER_SITE_OTHER): Payer: PPO | Admitting: Family Medicine

## 2023-08-04 ENCOUNTER — Encounter (INDEPENDENT_AMBULATORY_CARE_PROVIDER_SITE_OTHER): Payer: Self-pay | Admitting: Family Medicine

## 2023-08-04 VITALS — HR 99 | Temp 98.9°F | Ht 63.0 in | Wt 252.0 lb

## 2023-08-04 DIAGNOSIS — N1831 Chronic kidney disease, stage 3a: Secondary | ICD-10-CM | POA: Diagnosis not present

## 2023-08-04 DIAGNOSIS — Z7901 Long term (current) use of anticoagulants: Secondary | ICD-10-CM | POA: Diagnosis not present

## 2023-08-04 DIAGNOSIS — G4733 Obstructive sleep apnea (adult) (pediatric): Secondary | ICD-10-CM | POA: Diagnosis not present

## 2023-08-04 DIAGNOSIS — Z6841 Body Mass Index (BMI) 40.0 and over, adult: Secondary | ICD-10-CM

## 2023-08-04 NOTE — Assessment & Plan Note (Signed)
Discussed recent approval of semaglutide for CKD but no clear indicators yet when that will be covered and for what severity of kidney disease.  Will need to discuss at next appointment to see if further guidelines are out as to whether or not she may be covered for her kidney disease.

## 2023-08-04 NOTE — Assessment & Plan Note (Addendum)
Patient on warfarin for chronic anticoagulation.  Her INR yesterday was 2.3 which is in her goal range.  She reports that she is consistent with her food intake.  Will follow up with anticoagulation clinic at next scheduled appointment.

## 2023-08-04 NOTE — Progress Notes (Signed)
SUBJECTIVE:  Chief Complaint: Obesity  Interim History: Patient has been exercising more over the last few weeks.  She ate off plan this weekend for the Super Bowl.  Prior to the Super Bowl she was eating vegetables and Lean Cuisines and boiled eggs.  She will eat an Egg McMuffin from [employer] if she eats out or a fairlife protein shake. No upcoming plans.   [name] is here to discuss her progress with her obesity treatment plan. She is on the Category 3 Plan and states she is following her eating plan approximately 50 % of the time. She states she is exercising 45-60 minutes 3-4 times per week.  She is doing a good amount of walking.  She is lifting a bit of weights and doing some resistance band exercises.    OBJECTIVE: Visit Diagnoses: Problem List Items Addressed This Visit       Respiratory   OSA (obstructive sleep apnea)   Discussed recent FDA approval of Zepbound for treatment of OSA.  We discussed that there are certain requirements for coverage and that not all insurances have picked up this medication for treatment of OSA.  Plan to revisit medication options at next appointment.        Genitourinary   Stage 3a chronic kidney disease (HCC)   Discussed recent approval of semaglutide for CKD but no clear indicators yet when that will be covered and for what severity of kidney disease.  Will need to discuss at next appointment to see if further guidelines are out as to whether or not she may be covered for her kidney disease.        Other   Chronic anticoagulation - Primary   Patient on warfarin for chronic anticoagulation.  Her INR yesterday was 2.3 which is in her goal range.  She reports that she is consistent with her food intake.  Will follow up with anticoagulation clinic at next scheduled appointment.       Vitals Temp: 98.9 F (37.2 C) BP: -- (has a heart pump,unable to get B/P sometimes) Pulse Rate: 99 SpO2: 99 %   Anthropometric Measurements Height: 5\' 3"   (1.6 m) Weight: 252 lb (114.3 kg) BMI (Calculated): 44.65 Weight at Last Visit: 253 lb Weight Lost Since Last Visit: 1 lb Weight Gained Since Last Visit: 0 Starting Weight: 243 lb Total Weight Loss (lbs): 1 lb (0.454 kg)   Body Composition  Body Fat %: 47.7 % Fat Mass (lbs): 120.2 lbs Muscle Mass (lbs): 125.4 lbs Total Body Water (lbs): 86.2 lbs Visceral Fat Rating : 16   Other Clinical Data Fasting: yes Labs: no Today's Visit #: 16 Starting Date: 04/09/22     ASSESSMENT AND PLAN:  Diet: Vergene is currently in the action stage of change. As such, her goal is to continue with weight loss efforts. She has agreed to Category 3 Plan.  Exercise: Manal has been instructed that some exercise is better than none for weight loss and overall health benefits.   Behavior Modification:  We discussed the following Behavioral Modification Strategies today: increasing lean protein intake, increasing vegetables, decreasing eating out, no skipping meals, and meal planning and cooking strategies.   No follow-ups on file.Marland Kitchen She was informed of the importance of frequent follow up visits to maximize her success with intensive lifestyle modifications for her multiple health conditions.  Attestation Statements:   Reviewed by clinician on day of visit: allergies, medications, problem list, medical history, surgical history, family history, social history, and previous encounter  notes.     Rudean Hitt, MD

## 2023-08-04 NOTE — Assessment & Plan Note (Signed)
Discussed recent FDA approval of Zepbound for treatment of OSA.  We discussed that there are certain requirements for coverage and that not all insurances have picked up this medication for treatment of OSA.  Plan to revisit medication options at next appointment.

## 2023-08-06 ENCOUNTER — Ambulatory Visit (INDEPENDENT_AMBULATORY_CARE_PROVIDER_SITE_OTHER): Payer: Medicare Other

## 2023-08-06 DIAGNOSIS — I428 Other cardiomyopathies: Secondary | ICD-10-CM

## 2023-08-08 LAB — CUP PACEART REMOTE DEVICE CHECK
Battery Remaining Longevity: 22 mo
Battery Voltage: 2.93 V
Brady Statistic RV Percent Paced: 0.01 %
Date Time Interrogation Session: 20250213043723
HighPow Impedance: 72 Ohm
Implantable Lead Connection Status: 753985
Implantable Lead Implant Date: 20150706
Implantable Lead Location: 753860
Implantable Pulse Generator Implant Date: 20150706
Lead Channel Impedance Value: 380 Ohm
Lead Channel Impedance Value: 456 Ohm
Lead Channel Pacing Threshold Amplitude: 0.875 V
Lead Channel Pacing Threshold Pulse Width: 0.4 ms
Lead Channel Sensing Intrinsic Amplitude: 5.625 mV
Lead Channel Sensing Intrinsic Amplitude: 5.625 mV
Lead Channel Setting Pacing Amplitude: 2.25 V
Lead Channel Setting Pacing Pulse Width: 0.4 ms
Lead Channel Setting Sensing Sensitivity: 0.3 mV

## 2023-08-13 ENCOUNTER — Ambulatory Visit (HOSPITAL_COMMUNITY): Payer: Self-pay | Admitting: Pharmacist

## 2023-08-13 LAB — POCT INR: INR: 3.5 — AB (ref 2.0–3.0)

## 2023-08-20 ENCOUNTER — Ambulatory Visit (HOSPITAL_COMMUNITY): Payer: Self-pay | Admitting: Pharmacist

## 2023-08-20 LAB — POCT INR: INR: 3.7 — AB (ref 2.0–3.0)

## 2023-08-24 ENCOUNTER — Encounter: Payer: Self-pay | Admitting: Internal Medicine

## 2023-08-25 ENCOUNTER — Other Ambulatory Visit: Payer: Self-pay

## 2023-08-25 MED FILL — Quinidine Gluconate Tab ER 324 MG: ORAL | 30 days supply | Qty: 60 | Fill #0 | Status: CN

## 2023-08-26 ENCOUNTER — Other Ambulatory Visit: Payer: Self-pay

## 2023-08-27 ENCOUNTER — Other Ambulatory Visit: Payer: Self-pay

## 2023-08-27 ENCOUNTER — Ambulatory Visit (HOSPITAL_COMMUNITY): Payer: Self-pay | Admitting: Pharmacist

## 2023-08-27 LAB — POCT INR: INR: 2.5 (ref 2.0–3.0)

## 2023-09-01 ENCOUNTER — Ambulatory Visit (INDEPENDENT_AMBULATORY_CARE_PROVIDER_SITE_OTHER): Payer: PPO | Admitting: Family Medicine

## 2023-09-01 ENCOUNTER — Encounter (INDEPENDENT_AMBULATORY_CARE_PROVIDER_SITE_OTHER): Payer: Self-pay | Admitting: Family Medicine

## 2023-09-01 VITALS — HR 75 | Temp 98.2°F | Ht 63.0 in | Wt 252.0 lb

## 2023-09-01 DIAGNOSIS — E66813 Obesity, class 3: Secondary | ICD-10-CM

## 2023-09-01 DIAGNOSIS — Z6841 Body Mass Index (BMI) 40.0 and over, adult: Secondary | ICD-10-CM

## 2023-09-01 DIAGNOSIS — E559 Vitamin D deficiency, unspecified: Secondary | ICD-10-CM | POA: Diagnosis not present

## 2023-09-01 NOTE — Progress Notes (Signed)
   SUBJECTIVE:  Chief Complaint: Obesity  Interim History: Patient has been working out more and eating more on plan.  She is frustrated with lack of scale change.  She is weighing out protein at supper and getting around [facility] in.  She likes pork tenderloin, steak, fish.  She is not getting in her snack calories either.  She feels like she needs to set reminders to eat.  Next few weeks she has an upcoming wedding.   Jay is here to discuss her progress with her obesity treatment plan. She is on the Category 3 Plan and states she is following her eating plan approximately 90 % of the time. She states she is exercising 30-40 minutes 7 times per week.   OBJECTIVE: Visit Diagnoses: Problem List Items Addressed This Visit       Other   Obesity, Class III, BMI 40-49.9 (morbid obesity) (HCC)   Anthropometric Measurements Height: 5\' 3"  (1.6 m) Weight: 252 lb (114.3 kg) BMI (Calculated): 44.65 Weight at Last Visit: 252 lb Weight Lost Since Last Visit: 0 Weight Gained Since Last Visit: 0 Starting Weight: 243 lb Total Weight Loss (lbs): 0 lb (0 kg) Body Composition  Body Fat %: 47.5 % Fat Mass (lbs): 119.8 lbs Muscle Mass (lbs): 125.8 lbs Total Body Water (lbs): 86.8 lbs Visceral Fat Rating : 16 Other Clinical Data Today's Visit #: 17 Starting Date: 04/09/22 Comments: Cat 3       Vitamin D deficiency - Primary   Last Vitamin D level half of goal.  She is on prescription strength Vitamin D.  Will continue prescription strength and lab can be repeated with PCP in July.        No data recorded       09/01/2023    8:00 AM 08/04/2023    8:00 AM 08/03/2023   10:05 AM  Vitals with BMI  Height 5\' 3"  5\' 3"    Weight 252 lbs 252 lbs   BMI 44.65 44.65   Systolic -- -- 109  Diastolic -- -- 93  Pulse 75 99       ASSESSMENT AND PLAN:  Diet: Marco is currently in the action stage of change. As such, her goal is to continue with weight loss efforts and has agreed to the Category  3 Plan.   Exercise:  For substantial health benefits, adults should do at least 150 minutes (2 hours and 30 minutes) a week of moderate-intensity, or 75 minutes (1 hour and 15 minutes) a week of vigorous-intensity aerobic physical activity, or an equivalent combination of moderate- and vigorous-intensity aerobic activity. Aerobic activity should be performed in episodes of at least 10 minutes, and preferably, it should be spread throughout the week.  Behavior Modification:  We discussed the following Behavioral Modification Strategies today: increasing lean protein intake, increasing vegetables, meal planning and cooking strategies, avoiding temptations, and planning for success.   Return in about 4 weeks (around 09/29/2023) for repeat IC.Marland Kitchen She was informed of the importance of frequent follow up visits to maximize her success with intensive lifestyle modifications for her multiple health conditions.  Attestation Statements:   Reviewed by clinician on day of visit: allergies, medications, problem list, medical history, surgical history, family history, social history, and previous encounter notes.   Reuben Likes, MD

## 2023-09-01 NOTE — Assessment & Plan Note (Signed)
 Last Vitamin D level half of goal.  She is on prescription strength Vitamin D.  Will continue prescription strength and lab can be repeated with PCP in July.

## 2023-09-03 ENCOUNTER — Ambulatory Visit: Payer: Medicare Other | Admitting: Dietician

## 2023-09-03 ENCOUNTER — Ambulatory Visit (HOSPITAL_COMMUNITY): Payer: Self-pay | Admitting: Pharmacist

## 2023-09-03 LAB — POCT INR: INR: 3.2 — AB (ref 2.0–3.0)

## 2023-09-07 ENCOUNTER — Encounter: Payer: Self-pay | Admitting: Gynecologic Oncology

## 2023-09-08 NOTE — Assessment & Plan Note (Signed)
 Anthropometric Measurements Height: 5\' 3"  (1.6 m) Weight: 252 lb (114.3 kg) BMI (Calculated): 44.65 Weight at Last Visit: 252 lb Weight Lost Since Last Visit: 0 Weight Gained Since Last Visit: 0 Starting Weight: 243 lb Total Weight Loss (lbs): 0 lb (0 kg) Body Composition  Body Fat %: 47.5 % Fat Mass (lbs): 119.8 lbs Muscle Mass (lbs): 125.8 lbs Total Body Water (lbs): 86.8 lbs Visceral Fat Rating : 16 Other Clinical Data Today's Visit #: 17 Starting Date: 04/09/22 Comments: Cat 3

## 2023-09-08 NOTE — Addendum Note (Signed)
 Addended by: Elease Etienne A on: 09/08/2023 02:45 PM   Modules accepted: Orders

## 2023-09-08 NOTE — Progress Notes (Signed)
 Remote ICD transmission.

## 2023-09-10 ENCOUNTER — Ambulatory Visit: Payer: Medicare Other | Admitting: Gynecologic Oncology

## 2023-09-10 ENCOUNTER — Ambulatory Visit (HOSPITAL_COMMUNITY): Payer: Self-pay | Admitting: Pharmacist

## 2023-09-10 LAB — POCT INR: INR: 2 (ref 2.0–3.0)

## 2023-09-11 ENCOUNTER — Inpatient Hospital Stay: Payer: Medicare Other | Attending: Gynecologic Oncology | Admitting: Gynecologic Oncology

## 2023-09-11 ENCOUNTER — Encounter: Payer: Self-pay | Admitting: Gynecologic Oncology

## 2023-09-11 ENCOUNTER — Inpatient Hospital Stay

## 2023-09-11 VITALS — HR 80 | Temp 98.0°F | Resp 20 | Wt 256.0 lb

## 2023-09-11 DIAGNOSIS — Z86018 Personal history of other benign neoplasm: Secondary | ICD-10-CM | POA: Diagnosis not present

## 2023-09-11 DIAGNOSIS — N951 Menopausal and female climacteric states: Secondary | ICD-10-CM | POA: Diagnosis not present

## 2023-09-11 DIAGNOSIS — Z9079 Acquired absence of other genital organ(s): Secondary | ICD-10-CM | POA: Diagnosis not present

## 2023-09-11 DIAGNOSIS — D3910 Neoplasm of uncertain behavior of unspecified ovary: Secondary | ICD-10-CM

## 2023-09-11 DIAGNOSIS — Z90710 Acquired absence of both cervix and uterus: Secondary | ICD-10-CM | POA: Diagnosis not present

## 2023-09-11 DIAGNOSIS — Z90722 Acquired absence of ovaries, bilateral: Secondary | ICD-10-CM | POA: Insufficient documentation

## 2023-09-11 DIAGNOSIS — Z79890 Hormone replacement therapy (postmenopausal): Secondary | ICD-10-CM | POA: Insufficient documentation

## 2023-09-11 NOTE — Progress Notes (Signed)
 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  Interval History: Patient reports doing well.  She denies any pelvic or abdominal pain.  Reports some intermittent urinary urgency, not bothersome.  Endorses normal bowel function.  Denies any vaginal bleeding.  Past Medical/Surgical History: Past Medical History:  Diagnosis Date   Anemia    Automatic implantable cardioverter-defibrillator in situ    B12 deficiency    Cancer (HCC)    Chronic CHF (congestive heart failure) (HCC)    a. EF 15-20% b. RHC (09/2013) RA 14, RV 57/22, PA 64/36 (48), PCWP 18, FIck CO/CI 3.7/1.6, PVR 8.1 WU, PA sat 47%    Chronic kidney disease    Heart valve problem    History of heart attack    History of hypothyroidism    History of stomach ulcers    HLD (hyperlipidemia)    Hyperthyroidism    Hypotension    Hypothyroidism    Morbid obesity (HCC)    Myocardial infarction (HCC) 08/2013   Nocturnal dyspnea    Nonischemic cardiomyopathy (HCC)    Palpitations    Sinus tachycardia    Sleep apnea    Snoring-prob OSA 09/04/2011   SOB (shortness of breath)    [employer] ICD lead RECALL  6949    Stroke (HCC) 2021   some residual right-sided weakness   UARS (upper airway resistance syndrome) 09/04/2011   HST 12/2013:  AHI 4/hr (numerous episodes of airflow reduction that did not have concomitant desaturation)    Vitamin D deficiency     Past Surgical History:  Procedure Laterality Date   BREATH TEK H PYLORI N/A 11/09/2014   Procedure: BREATH TEK H PYLORI;  Surgeon: Gaynelle Adu, MD;  Location: [facility];  Service: General;  Laterality: N/A;   CARDIAC CATHETERIZATION  ~ 2006; 09/2013   CARDIAC CATHETERIZATION N/A 05/23/2016   Procedure: Right  Heart Cath;  Surgeon: Laurey Morale, MD;  Location: [facility] INVASIVE CV LAB;  Service: Cardiovascular;  Laterality: N/A;   CARDIAC DEFIBRILLATOR PLACEMENT  2006; 12/26/2013   Medtronic [id number]; 12-2013 ICD gen change and RV lead revision with new 6935 RV lead by Dr Graciela Husbands   CENTRAL LINE INSERTION  08/28/2021   Procedure: CENTRAL LINE INSERTION;  Surgeon: Laurey Morale, MD;  Location: [facility] INVASIVE CV LAB;  Service: Cardiovascular;;   CESAREAN SECTION  06/23/1997   IMPLANTABLE CARDIOVERTER DEFIBRILLATOR GENERATOR CHANGE N/A 12/26/2013   Procedure: IMPLANTABLE CARDIOVERTER DEFIBRILLATOR GENERATOR CHANGE;  Surgeon: Duke Salvia, MD;  Location: [facility] CATH LAB;  Service: Cardiovascular;  Laterality: N/A;   INSERTION OF IMPLANTABLE LEFT VENTRICULAR ASSIST DEVICE N/A 09/03/2021   Procedure: INSERTION OF IMPLANTABLE LEFT VENTRICULAR ASSIST DEVICE/ Heartmate 3;  Surgeon: Lovett Sox, MD;  Location: [facility] OR;  Service: Open Heart Surgery;  Laterality: N/A;   IR CT HEAD LTD  04/17/2020   IR CT HEAD LTD  04/17/2020   IR INTRA CRAN STENT  04/17/2020   IR PERCUTANEOUS ART THROMBECTOMY/INFUSION INTRACRANIAL INC DIAG ANGIO  04/17/2020   IR RADIOLOGIST EVAL & MGMT  06/05/2020   LEAD REVISION N/A 12/26/2013   Procedure: LEAD REVISION;  Surgeon: Duke Salvia, MD;  Location: [facility] CATH LAB;  Service: Cardiovascular;  Laterality: N/A;   LEFT VENTRICULAR ASSIST DEVICE     PLACEMENT OF  IMPELLA LEFT VENTRICULAR ASSIST DEVICE Right 08/29/2021   Procedure: PLACEMENT OF IMPELLA 5.5 LEFT VENTRICULAR ASSIST DEVICE;  Surgeon: Alleen Borne, MD;  Location: [facility] OR;  Service: Open Heart Surgery;  Laterality: Right;   RADIOLOGY WITH ANESTHESIA N/A 04/17/2020   Procedure: Code Stroke;  Surgeon: Julieanne Cotton, MD;  Location: [facility] OR;  Service: Radiology;  Laterality: N/A;   REMOVAL OF IMPELLA LEFT VENTRICULAR ASSIST DEVICE N/A 09/03/2021   Procedure: REMOVAL OF IMPELLA LEFT VENTRICULAR ASSIST DEVICE;  Surgeon: Lovett Sox, MD;  Location: [facility] OR;  Service: Open Heart Surgery;  Laterality: N/A;   RIGHT HEART CATH N/A 08/28/2021   Procedure: RIGHT HEART CATH;  Surgeon: Laurey Morale, MD;  Location: [facility] INVASIVE CV LAB;  Service: Cardiovascular;  Laterality: N/A;   RIGHT HEART CATHETERIZATION N/A 02/15/2014   Procedure: RIGHT HEART CATH;  Surgeon: Laurey Morale, MD;  Location: [facility] CATH LAB;  Service: Cardiovascular;  Laterality: N/A;   ROBOTIC ASSISTED TOTAL HYSTERECTOMY WITH BILATERAL SALPINGO OOPHERECTOMY N/A 02/05/2022   Procedure: XI ROBOTIC ASSISTED TOTAL HYSTERECTOMY WITH BILATERAL SALPINGO OOPHORECTOMY, CYSTOSCOPY, UTERUS GREATER THAN 250 GRAMS;  Surgeon: Carver Fila, MD;  Location: [facility] OR;  Service: Gynecology;  Laterality: N/A;   TEE WITHOUT CARDIOVERSION N/A 08/29/2021   Procedure: TRANSESOPHAGEAL ECHOCARDIOGRAM (TEE);  Surgeon: Alleen Borne, MD;  Location: [facility] OR;  Service: Open Heart Surgery;  Laterality: N/A;   TEE WITHOUT CARDIOVERSION N/A 09/03/2021   Procedure: TRANSESOPHAGEAL ECHOCARDIOGRAM (TEE);  Surgeon: Lovett Sox, MD;  Location[facility] OR;  Service: Open Heart Surgery;  Laterality: N/A;   TUBAL LIGATION  06/23/1997    Family History  Problem Relation Age of Onset   Hypertension Mother    Heart disease Mother    Obesity Mother    Heart Problems Mother    Hypertension Father    High blood pressure Father    Stroke Father    Heart disease Maternal Grandmother    Colon cancer Neg Hx    Breast cancer Neg Hx    Ovarian cancer Neg Hx    Endometrial cancer Neg Hx    Pancreatic cancer Neg Hx    Prostate cancer Neg Hx    Liver disease Neg Hx    Cancer - Colon Neg Hx     Social History   Socioeconomic History   Marital status: Married    Spouse name: Brecklyn Galvis   Number of children: 2   Years of education: Not on file   Highest education level: Not on file  Occupational History   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  Tobacco Use   Smoking status: Never    Smokeless tobacco: Never  Vaping Use   Vaping status: Never Used  Substance and Sexual Activity   Alcohol use: No   Drug use: No   Sexual activity: Not Currently  Other Topics Concern   Not on file  Social History Narrative   Neita Carp sometimes   Right handed   Social Drivers of Health   Financial Resource Strain: Low Risk  (10/20/2022)   Overall Financial Resource Strain (CARDIA)    Difficulty of Paying Living Expenses: Not hard at all  Food Insecurity: No Food Insecurity (10/20/2022)   Hunger Vital Sign    Worried About Running Out of Food in the Last Year: Never true    Ran Out of Food in the Last Year: Never true  Transportation Needs: No Transportation Needs (10/20/2022)   PRAPARE - Transportation  Lack of Transportation (Medical): No    Lack of Transportation (Non-Medical): No  Physical Activity: Sufficiently Active (10/20/2022)   Exercise Vital Sign    Days of Exercise per Week: 3 days    Minutes of Exercise per Session: 60 min  Stress: No Stress Concern Present (10/20/2022)   [employer] of Occupational Health - Occupational Stress Questionnaire    Feeling of Stress : Only a little  Social Connections: Socially Integrated (10/20/2022)   Social Connection and Isolation Panel [NHANES]    Frequency of Communication with Friends and Family: More than three times a week    Frequency of Social Gatherings with Friends and Family: More than three times a week    Attends Religious Services: More than 4 times per year    Active Member of [employer] or Organizations: Yes    Attends Engineer, structural: More than 4 times per year    Marital Status: Married    Current Medications:  Current Outpatient Medications:    ALPRAZolam (XANAX) 0.25 MG tablet, Take 1 tablet (0.25 mg total) by mouth 2 (two) times daily as needed for anxiety., Disp: 20 tablet, Rfl: 2   amiodarone (PACERONE) 200 MG tablet, Take 1 tablet (200 mg total) by mouth daily., Disp: 90 tablet, Rfl: 3    ASPIRIN LOW DOSE 81 MG tablet, TAKE ONE TABLET BY MOUTH ONCE A DAY, Disp: 30 tablet, Rfl: 11   dapagliflozin propanediol (FARXIGA) 10 MG TABS tablet, Take 1 tablet (10 mg total) by mouth daily before breakfast., Disp: 90 tablet, Rfl: 3   estradiol (CLIMARA - DOSED IN MG/24 HR) 0.0375 mg/24hr patch, Place 1 patch (0.0375 mg total) onto the skin every 7 (seven) days., Disp: 4 patch, Rfl: 11   potassium chloride SA (KLOR-CON M) 20 MEQ tablet, Take 1 tablet (20 mEq total) by mouth daily., Disp: 270 tablet, Rfl: 3   quiniDINE gluconate 324 MG CR tablet, Take 1 tablet (324 mg total) by mouth 2 (two) times daily., Disp: 60 tablet, Rfl: 6   rosuvastatin (CRESTOR) 40 MG tablet, Take 1 tablet (40 mg total) by mouth daily., Disp: 90 tablet, Rfl: 3   sildenafil (REVATIO) 20 MG tablet, Take 1 tablet (20 mg total) by mouth 3 (three) times daily., Disp: 90 tablet, Rfl: 6   spironolactone (ALDACTONE) 25 MG tablet, Take 1 tablet (25 mg total) by mouth daily., Disp: 45 tablet, Rfl: 3   torsemide (DEMADEX) 20 MG tablet, Take 1 tablet (20 mg total) by mouth every other day. Take 1 tablet (20 mg) by mouth as needed, or as directed by heart failure clinic., Disp: 180 tablet, Rfl: 3   Vitamin D, Ergocalciferol, (DRISDOL) 1.25 MG (50000 UNIT) CAPS capsule, Take 1 capsule (50,000 Units total) by mouth every 7 (seven) days., Disp: 12 capsule, Rfl: 0   warfarin (COUMADIN) 2 MG tablet, Take 4 mg (2 tabs) daily or as directed by [facility] Clinic. (Patient taking differently: Take 2 mg by mouth daily at 4 PM.  2 mg (2 mg x 1) every Tue, Thu, Sat; 4 mg (2 mg x 2) all other days or as directed by [facility] Clinic.), Disp: 180 tablet, Rfl: 3  Review of Systems: Denies appetite changes, fevers, chills, fatigue, unexplained weight changes. Denies hearing loss, neck lumps or masses, mouth sores, ringing in ears or voice changes. Denies cough or wheezing.  Denies shortness of breath. Denies chest pain or palpitations. Denies leg swelling. Denies  abdominal distention, pain, blood in stools, constipation, diarrhea, nausea, vomiting, or  early satiety. Denies pain with intercourse, dysuria, frequency, hematuria or incontinence. Denies hot flashes, pelvic pain, vaginal bleeding or vaginal discharge.   Denies joint pain, back pain or muscle pain/cramps. Denies itching, rash, or wounds. Denies dizziness, headaches, numbness or seizures. Denies swollen lymph nodes or glands, denies easy bruising or bleeding. Denies anxiety, depression, confusion, or decreased concentration.  Physical Exam: Pulse 80   Temp 98 F (36.7 C) (Oral)   Resp 20   Wt 256 lb (116.1 kg)   LMP 03/28/2021 (Approximate)   SpO2 99%   BMI 45.35 kg/m  General: Alert, oriented, no acute distress. HEENT: Normocephalic, atraumatic, sclera anicteric. Chest: Unlabored breathing on room air.  Lungs clear to auscultation bilaterally. Abdomen: Obese, soft, nontender.  Normoactive bowel sounds.  No masses or hepatosplenomegaly appreciated.  Well-healed incisions. Extremities: Grossly normal range of motion.  Warm, well perfused.  No edema bilaterally. Skin: No rashes or lesions noted. Lymphatics: No cervical, supraclavicular, or inguinal adenopathy. GU: Normal appearing external genitalia without erythema, excoriation, or lesions.  Speculum exam reveals cuff intact, no lesions or atypical vascularity.  Bimanual exam reveals no masses or nodularity.  Laboratory & Radiologic Studies:      Component Ref Range & Units (hover) 6 mo ago 11 mo ago 1 yr ago  Inhibin B <7.0 <7.0 CM <7.0 CM     Assessment & Plan: [name] is a 53 y.o. 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  Surgery 01/2022.  Patient is overall doing well, NED on exam today. Inhibin B performed today and she will be contacted with the results.   On estrogen patch given improvement in hot flashes. Will discuss possible decrease in dose at next follow-up.  We will continue with surveillance  visits at this time and an interval of every 6 months. She will see Melissa for her next follow-up.   We discussed signs and symptoms that would be concerning for recurrence of granulosa cell tumor and she is advised to call the office for any needs, concerns, or new persistent symptoms.  20 minutes of total time was spent for this patient encounter, including preparation, face-to-face counseling with the patient and coordination of care, and documentation of the encounter.  Eugene Garnet, MD  Division of Gynecologic Oncology  Department of Obstetrics and Gynecology  [facility]

## 2023-09-11 NOTE — Patient Instructions (Signed)
It was good to see you today.  I do not see or feel any evidence of cancer recurrence on your exam.  We will see you for follow-up in 6 months.  As always, if you develop any new and concerning symptoms before your next visit, please call to see me sooner.

## 2023-09-15 ENCOUNTER — Encounter: Payer: Self-pay | Admitting: Gynecologic Oncology

## 2023-09-15 LAB — INHIBIN B: Inhibin B: <7 pg/mL

## 2023-09-17 ENCOUNTER — Ambulatory Visit (HOSPITAL_COMMUNITY): Payer: Self-pay | Admitting: Pharmacist

## 2023-09-17 LAB — POCT INR: INR: 2.6 (ref 2.0–3.0)

## 2023-09-24 ENCOUNTER — Ambulatory Visit (HOSPITAL_COMMUNITY): Payer: Self-pay | Admitting: Pharmacist

## 2023-09-24 LAB — POCT INR: INR: 2.5 (ref 2.0–3.0)

## 2023-09-25 ENCOUNTER — Other Ambulatory Visit: Payer: Self-pay

## 2023-09-25 ENCOUNTER — Other Ambulatory Visit: Payer: Self-pay | Admitting: Gynecologic Oncology

## 2023-09-25 ENCOUNTER — Other Ambulatory Visit: Payer: Self-pay | Admitting: Nurse Practitioner

## 2023-09-25 DIAGNOSIS — F410 Panic disorder [episodic paroxysmal anxiety] without agoraphobia: Secondary | ICD-10-CM

## 2023-09-25 MED ORDER — ALPRAZOLAM 0.25 MG PO TABS
0.2500 mg | ORAL_TABLET | Freq: Two times a day (BID) | ORAL | 2 refills | Status: AC | PRN
  Filled 2023-09-25: qty 15, 7d supply, fill #0
  Filled 2023-09-25: qty 5, 3d supply, fill #0
  Filled 2023-11-27: qty 20, 10d supply, fill #1
  Filled 2024-02-29: qty 20, 10d supply, fill #2

## 2023-09-25 MED ORDER — ESTRADIOL 0.0375 MG/24HR TD PTWK
0.0375 mg | MEDICATED_PATCH | TRANSDERMAL | 11 refills | Status: AC
  Filled 2023-09-25: qty 4, 28d supply, fill #0
  Filled 2023-10-26: qty 4, 28d supply, fill #1
  Filled 2024-01-07: qty 4, 28d supply, fill #2
  Filled 2024-04-09: qty 4, 28d supply, fill #3
  Filled 2024-05-09: qty 4, 28d supply, fill #4
  Filled 2024-06-08: qty 4, 28d supply, fill #5
  Filled 2024-07-06 – 2024-07-14 (×2): qty 4, 28d supply, fill #6

## 2023-09-25 MED FILL — Quinidine Gluconate Tab ER 324 MG: ORAL | 30 days supply | Qty: 60 | Fill #0 | Status: CN

## 2023-09-29 ENCOUNTER — Ambulatory Visit (INDEPENDENT_AMBULATORY_CARE_PROVIDER_SITE_OTHER): Admitting: Family Medicine

## 2023-09-29 ENCOUNTER — Other Ambulatory Visit: Payer: Self-pay

## 2023-09-29 ENCOUNTER — Encounter (INDEPENDENT_AMBULATORY_CARE_PROVIDER_SITE_OTHER): Payer: Self-pay | Admitting: Family Medicine

## 2023-09-29 VITALS — HR 68 | Temp 98.3°F | Ht 63.0 in | Wt 258.0 lb

## 2023-09-29 DIAGNOSIS — R0602 Shortness of breath: Secondary | ICD-10-CM

## 2023-09-29 DIAGNOSIS — Z6841 Body Mass Index (BMI) 40.0 and over, adult: Secondary | ICD-10-CM | POA: Diagnosis not present

## 2023-09-29 NOTE — Assessment & Plan Note (Signed)
 RMR of 1858 on 04/09/22.  She is experiencing worsening symptoms so indirect calorimetry was repeated today.  RMR today of 1469 which is an almost 400 calorie decrease.  Expected decrease with loss of weight and therefore loss of muscle mass.

## 2023-09-29 NOTE — Progress Notes (Signed)
   SUBJECTIVE:  Chief Complaint: Obesity  Interim History: She has been eating more of the same foods recently.  She has been making a breakfast casserole with eggs, egg whites, vegetarian sausage with green peppers and onions.  She has been making this quite a bit as well as the oats overnight with fairlife milk. She has an upcoming wedding on Saturday.   [name] is here to discuss her progress with her obesity treatment plan. She is on the Category 3 Plan and states she is following her eating plan approximately 75-80 % of the time. She states she is exercising 45-60 minutes 4-5 times per week she is also renovating part of her mother's house which has got her moving more.   OBJECTIVE: Visit Diagnoses: Problem List Items Addressed This Visit       Other   Morbid obesity (HCC)   Anthropometric Measurements Height: 5\' 3"  (1.6 m) Weight: 258 lb (117 kg) BMI (Calculated): 45.71 Weight at Last Visit: 252 lb Weight Lost Since Last Visit: 0 Weight Gained Since Last Visit: 6 Starting Weight: 243 lb Total Weight Loss (lbs): 0 lb (0 kg) Body Composition  Body Fat %: 48.2 % Fat Mass (lbs): 124.8 lbs Muscle Mass (lbs): 127.2 lbs Total Body Water (lbs): 88 lbs Visceral Fat Rating : 16 Other Clinical Data Today's Visit #: 18 Starting Date: 04/09/22       SOBOE (shortness of breath on exertion) - Primary   RMR of 1858 on 04/09/22.  She is experiencing worsening symptoms so indirect calorimetry was repeated today.  RMR today of 1469 which is an almost 400 calorie decrease.  Expected decrease with loss of weight and therefore loss of muscle mass.      Other Visit Diagnoses       BMI 45.0-49.9, adult (HCC)           No data recorded       10/01/2023    9:34 AM 09/29/2023    9:00 AM 09/11/2023    3:20 PM  Vitals with BMI  Height 5\' 3"  5\' 3"    Weight 263 lbs 258 lbs 256 lbs  BMI 46.6 45.71 45.36  Systolic   --  Diastolic   --  Pulse  68 80      ASSESSMENT AND  PLAN:  Diet: [name] is currently in the action stage of change. As such, her goal is to continue with weight loss efforts and has agreed to the Category 3 Plan.   Exercise:  For substantial health benefits, adults should do at least 150 minutes (2 hours and 30 minutes) a week of moderate-intensity, or 75 minutes (1 hour and 15 minutes) a week of vigorous-intensity aerobic physical activity, or an equivalent combination of moderate- and vigorous-intensity aerobic activity. Aerobic activity should be performed in episodes of at least 10 minutes, and preferably, it should be spread throughout the week.  Behavior Modification:  We discussed the following Behavioral Modification Strategies today: increasing lean protein intake, decreasing simple carbohydrates, meal planning and cooking strategies, keeping healthy foods in the home, and avoiding temptations.   Return in about 4 weeks (around 10/27/2023).Aaron Aas She was informed of the importance of frequent follow up visits to maximize her success with intensive lifestyle modifications for her multiple health conditions.  Attestation Statements:   Reviewed by clinician on day of visit: allergies, medications, problem list, medical history, surgical history, family history, social history, and previous encounter notes.   Donaciano Frizzle, MD

## 2023-10-01 ENCOUNTER — Ambulatory Visit (HOSPITAL_COMMUNITY): Payer: Self-pay | Admitting: Pharmacist

## 2023-10-01 ENCOUNTER — Ambulatory Visit (INDEPENDENT_AMBULATORY_CARE_PROVIDER_SITE_OTHER): Payer: Medicare Other | Admitting: Internal Medicine

## 2023-10-01 ENCOUNTER — Encounter: Payer: Self-pay | Admitting: Internal Medicine

## 2023-10-01 VITALS — Ht 63.0 in | Wt 263.0 lb

## 2023-10-01 DIAGNOSIS — T462X5A Adverse effect of other antidysrhythmic drugs, initial encounter: Secondary | ICD-10-CM | POA: Diagnosis not present

## 2023-10-01 DIAGNOSIS — E064 Drug-induced thyroiditis: Secondary | ICD-10-CM | POA: Diagnosis not present

## 2023-10-01 LAB — T4, FREE: Free T4: 1.3 ng/dL (ref 0.8–1.8)

## 2023-10-01 LAB — TSH: TSH: 4.91 m[IU]/L — ABNORMAL HIGH

## 2023-10-01 LAB — POCT INR: INR: 2.3 (ref 2.0–3.0)

## 2023-10-01 LAB — T3, FREE: T3, Free: 2.7 pg/mL (ref 2.3–4.2)

## 2023-10-01 NOTE — Progress Notes (Unsigned)
 Name: [name]  MRN/ DOB: [id number], May 25, 1971    Age/ Sex: 53 y.o., female    PCP: Bethanie Dicker, NP   Reason for Endocrinology Evaluation: Hyperthyroid     Date of Initial Endocrinology Evaluation: 10/08/2021    HPI: [name] is a 53 y.o. female with a past medical history of CHF, nonischemic cardiomyopathy (S/P ICD placement and LVAD), CVA, pulmonary HTN, paroxysmal A-fib. The patient presented for initial endocrinology clinic visit on 4/18/2023for consultative assistance with her Hyperthyroidism.   Patient with extensive cardiac history to include A-fib, and nonischemic cardiomyopathy (s/p insertion of ICD) as well as left MCA territory CVA, s/p thrombectomy and stent placement.   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 08/2021 No FH of thyroid disease  No prior thyroid /neck sx   She was already on methimazole and prednisone on her initial visit with me which we have adjusted   Prednisone was stopped by 08/2022  Methimazole was stopped 01/28/2023  SUBJECTIVE:    Today (10/01/23): 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 EF 15-20% b. RHC (09/2013) RA 14, RV 57/22, PA 64/36 (48), PCWP 18, FIck CO/CI 3.7/1.6, PVR 8.1 WU, PA sat 47%    Chronic kidney disease    Heart valve problem    History of heart attack    History of hypothyroidism    History of stomach ulcers    HLD (hyperlipidemia)    Hyperthyroidism    Hypotension    Hypothyroidism    Morbid obesity (HCC)    Myocardial infarction (HCC) 08/2013   Nocturnal dyspnea    Nonischemic cardiomyopathy (HCC)    Palpitations    Sinus tachycardia    Sleep apnea    Snoring-prob OSA 09/04/2011   SOB (shortness of breath)    S[employer] Fidel[employer] ICD lead RECALL  6949    Stroke (HCC) 2021   some residual right-sided weakness   UARS (upper airway resistance syndrome) 09/04/2011   HST 12/2013:  AHI 4/hr (numerous episodes of airflow reduction that did not have concomitant desaturation)    Vitamin D deficiency    Past Surgical History:  Past Surgical History:  Procedure Laterality Date   BREATH TEK H PYLORI N/A 11/09/2014   Procedure: BREATH TEK H PYLORI;  Surgeon: Gaynelle Adu, MD;  Location: [facility];  Service: General;  Laterality: N/A;   CARDIAC CATHETERIZATION  ~ 2006; 09/2013   CARDIAC CATHETERIZATION N/A 05/23/2016   Procedure: Right Heart Cath;  Surgeon: Laurey Morale, MD;  Location: [facility] INVASIVE CV LAB;  Service: Cardiovascular;  Laterality: N/A;   CARDIAC DEFIBRILLATOR PLACEMENT  2006; 12/26/2013   Medtronic [id number]; 12-2013 ICD gen change and RV lead revision with new 6935 RV lead by Dr Graciela Husbands   CENTRAL LINE INSERTION  08/28/2021   Procedure: CENTRAL LINE INSERTION;  Surgeon: Laurey Morale, MD;  Location: [facility]  INVASIVE CV LAB;  Service: Cardiovascular;;   CESAREAN SECTION  06/23/1997   IMPLANTABLE CARDIOVERTER DEFIBRILLATOR GENERATOR CHANGE N/A 12/26/2013   Procedure: IMPLANTABLE CARDIOVERTER DEFIBRILLATOR GENERATOR CHANGE;  Surgeon: Duke Salvia, MD;  Location: [facility] CATH LAB;  Service: Cardiovascular;  Laterality: N/A;   INSERTION OF IMPLANTABLE LEFT VENTRICULAR ASSIST DEVICE N/A 09/03/2021   Procedure: INSERTION OF IMPLANTABLE LEFT VENTRICULAR ASSIST DEVICE/ Heartmate 3;  Surgeon: Lovett Sox, MD;  Location: [facility] OR;  Service: Open Heart Surgery;  Laterality: N/A;   IR CT HEAD LTD  04/17/2020   IR CT HEAD LTD  04/17/2020   IR INTRA CRAN STENT  04/17/2020   IR PERCUTANEOUS ART THROMBECTOMY/INFUSION INTRACRANIAL INC DIAG ANGIO  04/17/2020   IR RADIOLOGIST EVAL & MGMT  06/05/2020   LEAD REVISION N/A 12/26/2013   Procedure: LEAD REVISION;  Surgeon: Duke Salvia, MD;  Location: [facility] CATH LAB;  Service: Cardiovascular;  Laterality: N/A;   LEFT VENTRICULAR ASSIST DEVICE     PLACEMENT OF IMPELLA LEFT VENTRICULAR ASSIST DEVICE Right 08/29/2021   Procedure: PLACEMENT OF IMPELLA 5.5 LEFT VENTRICULAR ASSIST DEVICE;  Surgeon: Alleen Borne, MD;  Location: [facility] OR;  Service: Open Heart Surgery;  Laterality: Right;   RADIOLOGY WITH ANESTHESIA N/A 04/17/2020   Procedure: Code Stroke;  Surgeon: Julieanne Cotton, MD;  Location: [facility] OR;  Service: Radiology;  Laterality: N/A;   REMOVAL OF IMPELLA LEFT VENTRICULAR ASSIST DEVICE N/A 09/03/2021   Procedure: REMOVAL OF IMPELLA LEFT VENTRICULAR ASSIST DEVICE;  Surgeon: Lovett Sox, MD;  Location: [facility] OR;  Service: Open Heart Surgery;  Laterality: N/A;   RIGHT HEART CATH N/A 08/28/2021   Procedure: RIGHT HEART CATH;  Surgeon: Laurey Morale, MD;  Location: [facility] INVASIVE CV LAB;  Service: Cardiovascular;  Laterality: N/A;   RIGHT HEART CATHETERIZATION N/A 02/15/2014   Procedure: RIGHT HEART CATH;  Surgeon: Laurey Morale, MD;  Location: [facility] CATH LAB;  Service:  Cardiovascular;  Laterality: N/A;   ROBOTIC ASSISTED TOTAL HYSTERECTOMY WITH BILATERAL SALPINGO OOPHERECTOMY N/A 02/05/2022   Procedure: XI ROBOTIC ASSISTED TOTAL HYSTERECTOMY WITH BILATERAL SALPINGO OOPHORECTOMY, CYSTOSCOPY, UTERUS GREATER THAN 250 GRAMS;  Surgeon: Carver Fila, MD;  Location: [facility] OR;  Service: Gynecology;  Laterality: N/A;   TEE WITHOUT CARDIOVERSION N/A 08/29/2021   Procedure: TRANSESOPHAGEAL ECHOCARDIOGRAM (TEE);  Surgeon: Alleen Borne, MD;  Location: [facility] OR;  Service: Open Heart Surgery;  Laterality: N/A;   TEE WITHOUT CARDIOVERSION N/A 09/03/2021   Procedure: TRANSESOPHAGEAL ECHOCARDIOGRAM (TEE);  Surgeon: Lovett Sox, MD;  Location[facility] OR;  Service: Open Heart Surgery;  Laterality: N/A;   TUBAL LIGATION  06/23/1997    Social History:  reports that she has never smoked. She has never used smokeless tobacco. She reports that she does not drink alcohol and does not use drugs. Family History: family history includes Heart Problems in her mother; Heart disease in her maternal grandmother and mother; High  blood pressure in her father; Hypertension in her father and mother; Obesity in her mother; Stroke in her father.   HOME MEDICATIONS: Allergies as of 10/01/2023   No Known Allergies      Medication List        Accurate as of October 01, 2023  9:39 AM. If you have any questions, ask your nurse or doctor.          ALPRAZolam 0.25 MG tablet Commonly known as: XANAX Take 1 tablet (0.25 mg total) by mouth 2 (two) times daily as needed for anxiety.   amiodarone 200 MG tablet Commonly known as: PACERONE Take 1 tablet (200 mg total) by mouth daily.   Aspirin Low Dose 81 MG tablet Generic drug: aspirin EC TAKE ONE TABLET BY MOUTH ONCE A DAY   estradiol 0.0375 mg/24hr patch Commonly known as: CLIMARA - Dosed in mg/24 hr Place 1 patch (0.0375 mg total) onto the skin every 7 (seven) days.   Farxiga 10 MG Tabs tablet Generic drug: dapagliflozin  propanediol Take 1 tablet (10 mg total) by mouth daily before breakfast.   potassium chloride SA 20 MEQ tablet Commonly known as: KLOR-CON M Take 1 tablet (20 mEq total) by mouth daily.   quiniDINE gluconate 324 MG CR tablet Take 1 tablet (324 mg total) by mouth 2 (two) times daily.   rosuvastatin 40 MG tablet Commonly known as: CRESTOR Take 1 tablet (40 mg total) by mouth daily.   sildenafil 20 MG tablet Commonly known as: REVATIO Take 1 tablet (20 mg total) by mouth 3 (three) times daily.   spironolactone 25 MG tablet Commonly known as: ALDACTONE Take 1 tablet (25 mg total) by mouth daily.   torsemide 20 MG tablet Commonly known as: DEMADEX Take 1 tablet (20 mg total) by mouth every other day. Take 1 tablet (20 mg) by mouth as needed, or as directed by heart failure clinic.   Vitamin D3 125 MCG (5000 UT) Caps Take 5,000 Units by mouth.   warfarin 2 MG tablet Commonly known as: COUMADIN Take as directed by the anticoagulation clinic. If you are unsure how to take this medication, talk to your nurse or doctor. Original instructions: Take 4 mg (2 tabs) daily or as directed by LVAD Clinic. What changed:  how much to take how to take this when to take this additional instructions          REVIEW OF SYSTEMS: A comprehensive ROS was conducted with the patient and is negative except as per HPI    OBJECTIVE:  VS: Ht 5\' 3"  (1.6 m)   Wt 271 lb (122.9 kg)   LMP 03/28/2021 (Approximate)   SpO2 99%   BMI 48.01 kg/m     Wt Readings from Last 3 Encounters:  10/01/23 271 lb (122.9 kg)  09/29/23 258 lb (117 kg)  09/11/23 256 lb (116.1 kg)     EXAM: General: Pt appears well and is in NAD  Neck: General: Supple without adenopathy. Thyroid: Thyroid size normal.  No goiter or nodules appreciated.   Lungs: Clear with good BS bilat   Extremities:  BL LE: no pretibial edema  Mental Status: Judgment, insight: Intact Orientation: Oriented to time, place, and  person Mood and affect: No depression, anxiety, or agitation     DATA REVIEWED:    Latest Reference Range & Units 06/02/23 09:43  TSH mIU/L 5.03 (H)  Triiodothyronine,Free,Serum 2.3 - 4.2 pg/mL 2.6  T4,Free(Direct) 0.8 - 1.8 ng/dL 1.3      Latest  Reference Range & Units 05/26/23 09:00  Sodium 135 - 145 mmol/L 138  Potassium 3.5 - 5.1 mmol/L 4.1  Chloride 98 - 111 mmol/L 105  CO2 22 - 32 mmol/L 24  Glucose 70 - 99 mg/dL 82  BUN 6 - 20 mg/dL 11  Creatinine 9.14 - 7.82 mg/dL 9.56 (H)  Calcium 8.9 - 10.3 mg/dL 9.2  Anion gap 5 - 15  9  GFR, Estimated >60 mL/min 52 (L)  (H): Data is abnormally high (L): Data is abnormally low   Thyroid ultrasound 08/27/2021  Right thyroid lobe is moderately heterogeneous with at least 2 small nodules, largest measuring 0.5 cm. These nodules do not meet criteria for biopsy or dedicated follow-up.   No definite thyroid tissue in the expected region of the left thyroid lobe. 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   IMPRESSION: 1. 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 2. Thyroid tissue is heterogeneous with small nodules. These nodules do not meet criteria for biopsy or dedicated follow-up.     ASSESSMENT/PLAN/RECOMMENDATIONS:   Amiodarone-induced thyroid disease:   -The patient was initially diagnosed with hypothyroidism and was on LT-for replacement for decades prior to developing hyperthyroidism in 08/2021 -The patient was on amiodarone from 07/2018 until 09/2021, restarted again in 08/2022 -LT-4 replacement was discontinued and she has been on methimazole since 08/26/2021 as well as prednisone -Prednisone was discontinued by March 2024 -Methimazole was discontinued 01/28/2023 -TSH elevated, but it is trending down.  I would hold off on starting Synthroid as long as her TFTs are improving    Follow-up in 4 months   Signed electronically by: Lyndle Herrlich, MD  [facility] Endocrinology  [facility] [address] [place], [place] [address] Phone: [phone] FAX: [phone]   CC: Bethanie Dicker, NP [address] [place] [address] Phone: [phone] Fax: [phone]   Return to Endocrinology clinic as below: Future Appointments  Date Time Provider Department Center  10/01/2023  9:50 AM Christiana Gurevich, Konrad Dolores, MD [facility] None  10/12/2023  9:00 AM Laurey Morale, MD [facility] None  10/21/2023  1:00 PM [facility] ANNUAL WELLNESS VISIT [facility]  11/05/2023  7:05 AM [facility] DEVICE REMOTES [facility]  11/05/2023  9:20 AM Langston Reusing, MD [facility] None  12/07/2023  9:40 AM Langston Reusing, MD [facility] None  01/05/2024  9:40 AM Bethanie Dicker, NP [facility]  02/04/2024  7:05 AM [facility] DEVICE REMOTES [facility]  02/12/2024  1:15 PM [facility] None  02/12/2024  1:45 PM Carver Fila, MD [facility] None  05/05/2024  7:05 AM [facility] DEVICE REMOTES [facility]  08/04/2024  7:05 AM [facility] DEVICE REMOTES [facility]  11/03/2024  7:05 AM [facility] DEVICE REMOTES [facility]  02/02/2025  7:05 AM [facility] DEVICE REMOTES [facility]

## 2023-10-02 ENCOUNTER — Encounter: Payer: Self-pay | Admitting: Internal Medicine

## 2023-10-07 ENCOUNTER — Telehealth: Payer: Self-pay | Admitting: *Deleted

## 2023-10-07 NOTE — Telephone Encounter (Signed)
 Per provider appt moved from 8/22 to 8/15. Patient aware of new date/time

## 2023-10-07 NOTE — Assessment & Plan Note (Signed)
 Anthropometric Measurements Height: 5\' 3"  (1.6 m) Weight: 258 lb (117 kg) BMI (Calculated): 45.71 Weight at Last Visit: 252 lb Weight Lost Since Last Visit: 0 Weight Gained Since Last Visit: 6 Starting Weight: 243 lb Total Weight Loss (lbs): 0 lb (0 kg) Body Composition  Body Fat %: 48.2 % Fat Mass (lbs): 124.8 lbs Muscle Mass (lbs): 127.2 lbs Total Body Water (lbs): 88 lbs Visceral Fat Rating : 16 Other Clinical Data Today's Visit #: 18 Starting Date: 04/09/22

## 2023-10-08 ENCOUNTER — Ambulatory Visit (HOSPITAL_COMMUNITY): Payer: Self-pay | Admitting: Pharmacist

## 2023-10-08 LAB — POCT INR: INR: 2.3 (ref 2.0–3.0)

## 2023-10-09 ENCOUNTER — Other Ambulatory Visit (HOSPITAL_COMMUNITY): Payer: Self-pay

## 2023-10-09 DIAGNOSIS — Z7901 Long term (current) use of anticoagulants: Secondary | ICD-10-CM

## 2023-10-09 DIAGNOSIS — Z95811 Presence of heart assist device: Secondary | ICD-10-CM

## 2023-10-12 ENCOUNTER — Other Ambulatory Visit: Payer: Self-pay

## 2023-10-12 ENCOUNTER — Ambulatory Visit (HOSPITAL_COMMUNITY)
Admission: RE | Admit: 2023-10-12 | Discharge: 2023-10-12 | Disposition: A | Discharge status: Home / Self Care | Payer: PPO | Source: Ambulatory Visit | Attending: Cardiology | Admitting: Cardiology

## 2023-10-12 ENCOUNTER — Ambulatory Visit (HOSPITAL_COMMUNITY): Payer: Self-pay | Admitting: Pharmacist

## 2023-10-12 DIAGNOSIS — I340 Nonrheumatic mitral (valve) insufficiency: Secondary | ICD-10-CM | POA: Insufficient documentation

## 2023-10-12 DIAGNOSIS — Z95811 Presence of heart assist device: Secondary | ICD-10-CM | POA: Diagnosis not present

## 2023-10-12 DIAGNOSIS — I493 Ventricular premature depolarization: Secondary | ICD-10-CM | POA: Insufficient documentation

## 2023-10-12 DIAGNOSIS — I5022 Chronic systolic (congestive) heart failure: Secondary | ICD-10-CM | POA: Diagnosis not present

## 2023-10-12 DIAGNOSIS — D649 Anemia, unspecified: Secondary | ICD-10-CM | POA: Insufficient documentation

## 2023-10-12 DIAGNOSIS — Z79899 Other long term (current) drug therapy: Secondary | ICD-10-CM | POA: Insufficient documentation

## 2023-10-12 DIAGNOSIS — N1831 Chronic kidney disease, stage 3a: Secondary | ICD-10-CM | POA: Diagnosis not present

## 2023-10-12 DIAGNOSIS — R0602 Shortness of breath: Secondary | ICD-10-CM | POA: Insufficient documentation

## 2023-10-12 DIAGNOSIS — Z7984 Long term (current) use of oral hypoglycemic drugs: Secondary | ICD-10-CM | POA: Insufficient documentation

## 2023-10-12 DIAGNOSIS — I428 Other cardiomyopathies: Secondary | ICD-10-CM | POA: Diagnosis not present

## 2023-10-12 DIAGNOSIS — I472 Ventricular tachycardia, unspecified: Secondary | ICD-10-CM | POA: Diagnosis not present

## 2023-10-12 DIAGNOSIS — Z4502 Encounter for adjustment and management of automatic implantable cardiac defibrillator: Secondary | ICD-10-CM | POA: Diagnosis not present

## 2023-10-12 DIAGNOSIS — Z8673 Personal history of transient ischemic attack (TIA), and cerebral infarction without residual deficits: Secondary | ICD-10-CM | POA: Insufficient documentation

## 2023-10-12 DIAGNOSIS — I130 Hypertensive heart and chronic kidney disease with heart failure and stage 1 through stage 4 chronic kidney disease, or unspecified chronic kidney disease: Secondary | ICD-10-CM | POA: Diagnosis not present

## 2023-10-12 DIAGNOSIS — I4891 Unspecified atrial fibrillation: Secondary | ICD-10-CM | POA: Diagnosis not present

## 2023-10-12 DIAGNOSIS — I2721 Secondary pulmonary arterial hypertension: Secondary | ICD-10-CM | POA: Insufficient documentation

## 2023-10-12 DIAGNOSIS — E0590 Thyrotoxicosis, unspecified without thyrotoxic crisis or storm: Secondary | ICD-10-CM | POA: Diagnosis not present

## 2023-10-12 DIAGNOSIS — Z7901 Long term (current) use of anticoagulants: Secondary | ICD-10-CM | POA: Insufficient documentation

## 2023-10-12 DIAGNOSIS — Z7982 Long term (current) use of aspirin: Secondary | ICD-10-CM | POA: Insufficient documentation

## 2023-10-12 LAB — CBC
HCT: 44 % (ref 36.0–46.0)
Hemoglobin: 13 g/dL (ref 12.0–15.0)
MCH: 25.2 pg — ABNORMAL LOW (ref 26.0–34.0)
MCHC: 29.5 g/dL — ABNORMAL LOW (ref 30.0–36.0)
MCV: 85.3 fL (ref 80.0–100.0)
Platelets: 210 10*3/uL (ref 150–400)
RBC: 5.16 MIL/uL — ABNORMAL HIGH (ref 3.87–5.11)
RDW: 15.6 % — ABNORMAL HIGH (ref 11.5–15.5)
WBC: 6.5 10*3/uL (ref 4.0–10.5)
nRBC: 0 % (ref 0.0–0.2)

## 2023-10-12 LAB — BASIC METABOLIC PANEL WITH GFR
Anion gap: 10 (ref 5–15)
BUN: 19 mg/dL (ref 6–20)
CO2: 24 mmol/L (ref 22–32)
Calcium: 9.1 mg/dL (ref 8.9–10.3)
Chloride: 106 mmol/L (ref 98–111)
Creatinine, Ser: 1.3 mg/dL — ABNORMAL HIGH (ref 0.44–1.00)
GFR, Estimated: 49 mL/min — ABNORMAL LOW (ref >60)
Glucose, Bld: 79 mg/dL (ref 70–99)
Potassium: 3.7 mmol/L (ref 3.5–5.1)
Sodium: 140 mmol/L (ref 135–145)

## 2023-10-12 LAB — PROTIME-INR
INR: 2.3 — ABNORMAL HIGH (ref 0.8–1.2)
Prothrombin Time: 25.7 s — ABNORMAL HIGH (ref 11.4–15.2)

## 2023-10-12 LAB — LACTATE DEHYDROGENASE: LDH: 225 U/L — ABNORMAL HIGH (ref 98–192)

## 2023-10-12 MED ORDER — ENTRESTO 24-26 MG PO TABS
1.0000 | ORAL_TABLET | Freq: Two times a day (BID) | ORAL | 3 refills | Status: DC
  Filled 2023-10-12: qty 60, 30d supply, fill #0

## 2023-10-12 NOTE — Progress Notes (Addendum)
 Pt presented for 2 month f/u with in VAD Clinic today alone. Reports no problems with VAD equipment or concerns with drive line.  Pt states she has been doing well since last visit. Denies lightheadedness, dizziness, falls or any signs of bleeding. Weight up 17lbs since last visit. Pt reports eating salty foods for Easter and has not taken a Torsemide  and has noticed shortness of breath at rest today. Discussed with Dr. Mitzie Anda pt provider with sample of Furosix 80mg /34mL today in clinic and instructed to then increase Torsemide  to 20mg  daily for 3 days and then advance 20mg  to every other day. She continues to participate in healthy weight and wellness program and has been able to increase her exercise routine without any limitation. Her insurance is no longer covering [employer]  but she is on a new nutrition plan with [facility].  Pt hypertensive today. Per Dr. Mitzie Anda will start Entresto  24-26. Prescription sent to [facility].  Amiodarone  restarted in February 2024 due to VT/VF episode Tolerating Amiodarone  200 mg daily and Quinidine. Pt requesting information on if she can stop her Quinidine due to cost. Discussed with Lauren RPH there are no options for grants with Quinidine at this time. Will reach out to Dr. Rodolfo Clan for further assistance.  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 Pt had follow up with Endocrinology 10/01/23. TSH remains elevated but trending down.  Medication Samples have been provided to the patient:  Drug name: Furoscix   Strength: 80mg /12mL  Qty: 1  LOT: [id number] Exp.Date: 01/20/25  Application instructions provided by Emer RN. Pt verbalized understanding.   Vital Signs:  Doppler Pressure: 120 Automatic BP: 129/89 (102) HR: 80 SR  SPO2: 98   Weight: 272.4 lbs w/o eqt Last weight: 255.6 lb w/o eqt  VAD Indication: Destination Therapy d/t BMI   VAD interrogation & Equipment Management: Speed: 5800 Flow:  5.2 Power: 4.6 w    PI: 4.4   Alarms: none Events: 10-20 daily  Fixed speed: 5800 Low speed limit: 5500   Primary Controller: Primacy back up controller replaced with [id number]. Replace back up battery in 33 months. Back up controller: Back up controller replaced with [id number]. Replace back up battery in 33 months.   Annual Equipment Maintenance on UBC/PM was performed on 08/03/23.    I reviewed the LVAD parameters from today and compared the results to the patient's prior recorded data. LVAD interrogation was NEGATIVE for significant power changes, NEGATIVE for clinical alarms and STABLE for PI events/speed drops. No programming changes were made and pump is functioning within specified parameters. Pt is performing daily controller and system monitor self tests along with completing weekly and monthly maintenance for LVAD equipment.   LVAD equipment check completed and is in good working order. Back-up equipment present. Charged back up battery and performed self-test on equipment.   Exit Site Care: CDI. Drive line anchor secure. Pt denies fever or chills. Provided with 8 weekly kits.  Significant Events on VAD Support:  N/A  Device: Medtronic Therapies: on VF 240 VT 200 VVI: 40 Last check: 08/08/23  BP & Labs:  Doppler 120- Doppler correlated with MAP   Hgb 13 - No S/S of bleeding. Specifically denies vaginal bleeding, melena/BRBPR or nosebleeds.    LDH stable at 225 with established baseline of 250 - 450. Denies tea-colored urine. No power elevations noted on interrogation.   Plan:  Start Entresto  24-29 Furoscix  today then start Torsemide  20mg  x 3 days then every other day  Will reach out to Dr. Rodolfo Clan in regards to Quinidine  Return in 10 days for BMET Return in 2 months for full visit with Dr. Christia Cowboy RN,BSN VAD Coordinator  Office: [phone]  24/7 Pager: [phone]   Chief Complaint: LVAD followup, CHF    History of Present Illness  [name] is a 53 y.o. 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  She was followed for systolic CHF initially in [place]. Apparently her EF recovered to 35-40%. However, she was admitted to [facility] 09/19/13 for worsening dyspnea and CP. Coronaries were reportedly OK on LHC at that time at [facility]. Echo showed EF of 15-20% with four-chamber enlargement with moderate-severe TR/MR. RHC as below. Rheumatological w/u negative.  CPX in 9/16 showed near-normal functional capacity.  Repeat echo in 6/17 shows that EF remains 20%. Repeat CPX in 1/18 was submaximal but probably only mild circulatory limitation.  Echo 8/18 showed EF 25-30%, severe LV dilation.  CPX in 6/19 showed low normal functional capacity.  Echo in 10/19 showed EF 20-25%, moderate LV dilation.    Zio patch in 2/20 with 11% PVCs and NSVT, amiodarone  started. Zio patch in 3/20 with 13.3% PVCs and NSVT.  She saw Dr. Rodolfo Clan, he thought that increased PVCs were electromechanical in nature due to worsening heart failure.  Repeat Zio patch on amiodarone  in 1/21 showed 6.2% PVCs.   She was admitted in 4/20 with VT in setting of hypokalemia.    CPX in 9/20 showed mild functional limitation, more due to body habitus than heart failure.  Echo in 4/21 showed EF 20% with severe LV dilation, diffuse hypokinesis, normal RV size and systolic function.   In 8/21, she had a shock for VF in the setting of hypokalemia.   Patient was admitted in 10/21 with CVA involving left MCA territory.  This was thought to be cardioembolic but LV thrombus was not visualized and no atrial fibrillation has been seen.  Echo in 10/21 showed EF 20-25%, severe LV dilation (no LV thrombus seen), mildly decreased RV systolic function, mild-moderate MR. She was taken by IR for thrombectomy and stent placement, now on ASA 81 and ticagrelor .   She had recurrent VT in 2/22, quinidine was  added by EP.  Spironolactone  and Coreg  were decreased due to low BP and lightheadedness.    Echo in 4/22 showed EF 25% with severe LV dilation and global hypokinesis, normal RV, mild-moderate MR.   CPX (8/22) showed peak VO2 14.3, VE/VCO2 slope 42, RER 1.09 => mild to moderate HF limitation, elevated VE/VCO2 slope is likely false from end exercise hyperventilation. Restrictive lung physiology from obesity.   Patient was admitted in 3/23 with recurrent CHF exacerbation with low cardiac output.  She was also noted to be hyperthyroidism but amiodarone  had to be continued due to VT and poorly tolerated atrial fibrillation.  She required Impella 5.5 placement and eventual HM3 LVAD placement on 09/04/21.  Post-op course complicated by atrial fibrillation, malnutrition, and deconditioning.  She went to CIR prior to discharge.   Patient developed heavy vaginal bleeding in 7/23, she was seen in the ER and required 2 units PRBCs.  She was admitted in 8/23 and had hysterectomy + BSO, LVAD speed increased to 5900 rpm on ramp echo.  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    The patient was admitted  overnight in 12/23 with severe posterior epistaxis requiring nasal pacing.   In 2/24, she had VF with shock.  Amiodarone  was restarted.   Echo in 7/24 showed  EF < 20, mild RV dysfunction with normal size, interventricular septum shifted mildly to right.   At a previous visit in 12/2022, her VAD speed was increased to 6000. She took a torsemide  a day before presenting to clinic in 02/2023 and noted an increased frequency of PI events despite drinking water . Her speed was reduced to 5900 rpm. She continued to have suction events and speed was decreased to 5700 rpm in 11/24.   Ramp echo was done in 12/24, septum bowed to right at 5700 rpm, so speed increased to 5800 rpm.  The aortic valve opened each beat at 5800 rpm with moderate RV dysfunction/normal RV size.   She returns for  followup of LVAD.  Weight up 17 lbs.  Feels short of breath today. Ate salty ham yesterday and feels like she is volume overloaded.  She was short of breath walking into the office.  No orthopnea/PND.  No lightheadedness. She was feeling good up to this weekend. MAP elevated today.  She has not been taking torsemide .   LVAD interrogation: Personally reviewed.  Please see LVAD nursing note above.  Rare PIs, no low flow events.   Labs: (11/09/13): Dig level 0.5, K 3.6, Creatinine 0.83, pro-BNP 622 (12/19/13): K 3.6, creatinine 0.8 (7/15): K 4.1, creatinine 0.91 (8/15): K 3.9, creatinine 0.91 (10/15): K 4, creatinine 0.93, proBNP 129 (12/15): K 4, creatinine 0.9, digoxin  0.4 (5/16): K 4, creatinine 0.86 (9/16): K 3.6, creatinine 0.86, digoxin  < 0.2 (5/17): K 4.1, creatinine 0.95, digoxin  0.3 (5/18): K 3.4, creatinine 0.95, TSH elevated but free T3 and free T4 normal.  (8/18): K 3.9, creatinine 0.87, BNP 108  (11/18): K 3.5, creatinine 1.04, digoxin  0.4 (1/19): LDL 91, HDL 47 (3/19): digoxin  0.7, K 4, creatinine 0.99 (4/19): TSH normal, LDL 95 (9/19): LDL 99, HDL 45, K 4, creatinine 0.96 (10/19): K 3.6, creatinine 0.85, digoxin  0.5 (2/20): LDL 103 (3/20): K 3.9, creatinine 1.05 (4/20): K 3.9, creatinine 1.23 => 1.15, TSH 8, digoxin  0.3, LFTs normal 6/20): K 4.3, creatinine 1.07, LFTs normal, digoxin  0.5 (7/20): K 3.8, creatinine 1.16 (8/20): digoxin  < 0.2, LDL 135 (1/21): K 3.9, creatinine 1.05, LDL 171  (4/21): K 4.2, creatinine 0.95, LDL 127, TSH normal, LFTs normal (10/21): K 2.8, creatinine 1.21, LDL 130 (11/21): K 4.5, creatinine 1.3 => 1.2, LFTs normal, digoxin  1.3, TSH normal (3/22): K 4.1, creatinine 1.36 (4/22): hgb 10.8 (7/22): Transferrin 8%, digoxin  0.9, hgb 10.1, K 3.9, creatinine 1.42 (8/22): digoxin  0.4 (9/22): K 4.2, creatinine 1.32 (4/23): hgb 11, WBCs 13, K 3.9 => 3.8, creatinine 0.9 => 0.98, albumin  3.2, ALT 45, AST 31, LDH 272 => 244, TSH 0.235, free T4  1.84 (7/23): hgb 8, TSH normal 8/23): K 3.8, creatinine 1.15, hgb 11.4 (9/23): K 3.7, creatinine 1.36 (10/23): K 3.6, creatinine 1.42 (11/23): TSH normal (1/24): K 3.9, creatinine 1.17 (2/24): TSH 4.75, hgb 11.3, K 3.4, creatinine 1.35, LFTs normal (3/24): LFTs normal (5/24): K 4, creatinine 1.4, hgb 12.5, LDH 193 (7/24): K 4, creatinine 1.3, LFTs normal, hgb 12.3, LDL 115, LDH 221 (12/24): K 4.1, creatinine 1.24, LDH 185, hgb 14.4 (1/25): K 4, creatinine 1.25, LFTs normal, LDL 88 (2/25): K 3.9, creatinine 1.5, LDH 174, hgb 14, LFTs normal (4/25): TSH 4.91, free T3 and T4 normal  PMH: 1. OSA: Using CPAP.  2. PVCs:  -  Zio patch 2/20: 11% PVCs - Zio patch 3/20: 13.3% PVCs - Zio patch 1/21: 6.2% PVCs 3. VT/VF: 8/21, in setting of hypokalemia.  2/22 VT, quinidine added.  - VT 2/24, restarted amiodarone  4. 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  No LV thrombus visualized and atrial fibrillation has not been visualized.  - IR thrombectomy with stent placement in 10/21.  5. Chronic systolic CHF: Nonischemic cardiomyopathy:   - Coronary angiography 2015 at [facility]: Normal coronaries.  - Echo (8/15) with EF 20%, moderate LV dilation, diffuse hypokinesis, normal RV size with mildly decreased systolic function, mild MR.  - Echo (5/16) with EF 20%, spherical LV with diffuse hypokinesis, mild MR.  - Echo (6/17) with EF 20%, diffuse hypokinesis, moderate LV dilation, normal RV, mild to moderate MR.  - RHC (12/17): mean RA 5, PA 56/31, mean PCWP 24, CI 2, PVR 3.7 WU - Echo (8/18) with EF 25-30%, severe LV dilation, severe LAE.  - Echo (2/20) with EF 20-25%, moderate LV dilation, moderate MR.  - Echo (4/21) with EF 20-25%, severe LV dilation, mild-moderate MR, normal RV, PASP 26 mmHg.  - Echo (10/21) with EF 20-25%, severe LV dilation (no LV thrombus seen), mildly decreased RV systolic function, mild-moderate MR. - CPX in 9/20 showed mild functional limitation, more due to body habitus than heart failure.   - CPX in 8/21: Peak VO2 11.6, VE/VCO2 32, RER 1.2.  Mild HF limitation.  - Echo (4/22): EF 25% with severe LV dilation and global hypokinesis, normal RV, mild-moderate MR.  - CPX (8/22): peak VO2 14.3, VE/VCO2 slope 42, RER 1.09 => mild to moderate HF limitation, elevated VE/VCO2 slope is likely false from end exercise hyperventilation. Restrictive lung physiology from obesity.  - HM3 LVAD placement 09/04/21.  - Echo (7/24): EF < 20, mild RV dysfunction with normal size, interventricular septum shifted mildly to right. - Ramp echo (12/24): Speed increased to 5800 rpm.  Aortic valve opens each beat, moderate RV dysfunction with normal RV size.  6. PFTs (1/22): Near normal.  7. Fe deficiency anemia 8. 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  10. Left ovarian mass -  hysterectomy + BSO.  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      Current Outpatient Medications  Medication Sig Dispense Refill   ALPRAZolam  (XANAX ) 0.25 MG tablet Take 1 tablet (0.25 mg total) by mouth 2 (two) times daily as needed for anxiety. 20 tablet 2   amiodarone  (PACERONE ) 200 MG tablet Take 1 tablet (200 mg total) by mouth daily. 90 tablet 3   ASPIRIN  LOW DOSE 81 MG tablet TAKE ONE TABLET BY MOUTH ONCE A DAY 30 tablet 11   Cholecalciferol (VITAMIN D3) 125 MCG (5000 UT) CAPS Take 5,000 Units by mouth.     dapagliflozin  propanediol (FARXIGA ) 10 MG TABS tablet Take 1 tablet (10 mg total) by mouth daily before breakfast. 90 tablet 3   estradiol  (CLIMARA  - DOSED IN MG/24 HR) 0.0375 mg/24hr patch Place 1 patch (0.0375 mg total) onto the skin every 7 (seven) days. 4 patch 11   potassium chloride  SA (KLOR-CON  M) 20 MEQ tablet Take 1 tablet (20 mEq total) by mouth daily. 270 tablet 3   quiniDINE gluconate  324 MG CR tablet Take 1 tablet (324 mg total) by mouth 2 (two) times daily. 60 tablet 6   rosuvastatin  (CRESTOR ) 40 MG tablet Take 1 tablet (40 mg  total) by mouth daily. 90 tablet 3   sacubitril -valsartan  (  ENTRESTO ) 24-26 MG Take 1 tablet by mouth 2 (two) times daily. 60 tablet 3   sildenafil  (REVATIO ) 20 MG tablet Take 1 tablet (20 mg total) by mouth 3 (three) times daily. 90 tablet 6   spironolactone  (ALDACTONE ) 25 MG tablet Take 1 tablet (25 mg total) by mouth daily. 45 tablet 3   torsemide  (DEMADEX ) 20 MG tablet Take 1 tablet (20 mg total) by mouth every other day. Take 1 tablet (20 mg) by mouth as needed, or as directed by heart failure clinic. 180 tablet 3   warfarin (COUMADIN ) 2 MG tablet Take 4 mg (2 tabs) daily or as directed by LVAD Clinic. (Patient taking differently: Take 2 mg by mouth daily at 4 PM.  2 mg (2 mg x 1) every Tue, Thu, Sat; 4 mg (2 mg x 2) all other days or as directed by LVAD Clinic.) 180 tablet 3   No current facility-administered medications for this encounter.    Allergies:   Patient has no known allergies.   Social History:  The patient  reports that she has never smoked. She has never used smokeless tobacco. She reports that she does not drink alcohol and does not use drugs.   Family History:  The patient's family history includes Heart Problems in her mother; Heart disease in her maternal grandmother and mother; High blood pressure in her father; Hypertension in her father and mother; Obesity in her mother; Stroke in her father.   ROS:  Please see the history of present illness.   All other systems are personally reviewed and negative.   Exam:    MAP 102 General: Well appearing this am. NAD.  HEENT: Normal. Neck: Supple, JVP 12 cm. Carotids OK.  Cardiac:  Mechanical heart sounds with LVAD hum present.  Lungs:  CTAB, normal effort.  Abdomen:  NT, ND, no HSM. No bruits or masses. +BS  LVAD exit site: Well-healed and incorporated. Dressing dry and intact. No erythema or drainage. Stabilization device present and accurately applied. Driveline dressing changed daily per sterile technique. Extremities:   Warm and dry. No cyanosis, clubbing, rash.  Trace ankle edema.   Neuro:  Alert & oriented x 3. Cranial nerves grossly intact. Moves all 4 extremities w/o difficulty. Affect pleasant    Recent Labs: 03/17/2023: Magnesium  1.8 08/03/2023: ALT 22 10/01/2023: TSH 4.91 10/12/2023: BUN 19; Creatinine, Ser 1.30; Hemoglobin 13.0; Platelets 210; Potassium 3.7; Sodium 140  Personally reviewed   Wt Readings from Last 3 Encounters:  10/12/23 123.6 kg (272 lb 6.4 oz)  10/01/23 119.3 kg (263 lb)  09/29/23 117 kg (258 lb)    ASSESSMENT AND PLAN:   1. Chronic systolic HF: Nonischemic cardiomyopathy.  She has a Medtronic ICD. Cause for cardiomyopathy is uncertain, initially identified peri-partum (after son's birth), so cannot rule out peri-partum CMP.  On and off, she has had frequent PVCs.   RHC in 8/15 showed normal filling pressures and preserved cardiac output. Echo in 4/22 with EF 25%, severe LV dilation.  CPX (8/22) with mild-moderate HF limitation.  Echo in 3/23 with EF < 20% with severe dilation, normal RV size with mildly decreased systolic function, moderate-severe functional MR with dilated IVC. 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 Impella 5.5 placed 08/29/21.  Patient had HM3 LVAD placed 09/04/21 and speed adjusted by ramp echo up to 5800 rpm and again up to 5900 rpm. In 7/24,  LVAD speed increased to 6000 rpm under echo guidance. Developed PI and suction events, speed decreased to 5700 rpm.  In 12/24, ramp echo done and speed increased to 5800 rpm; the aortic valve opened each beat and the septum was midline.  NYHA class III today and volume overloaded, weight up.  Think she is worse due to recent dietary indiscretion with a lot of sodium intake. LVAD parameters stable.    - I will give her 1 dose of Furoscix  80 mg [place] today, then torsemide  20 mg daily starting  tomorrow for 3 days then down to 20 mg every other day. BMET today and in 10 days.    - Continue Farxiga  10 mg daily.  - Continue spironolactone  25 mg daily.  - Increase Entresto  to 24/26 bid.  - She should continue ASA 81 mg daily with history of CVA.  - Continue sildenafil  20 mg tid  - Warfarin for VAD, goal INR 2-2.5.   2. Hyperthyroidism: Has history of hypothroidism thought to be associated with amiodarone .  She had been on Levoxyl  at home prior to 3/23 admission, noted to be hyperthyroid in 3/23.  This likely contributes to her CHF. She had recurrent VT and poorly tolerated AF which made it difficult for us  to stop amiodarone . Initially treated w/ IV Solumedrol -> transitioned to prednisone  and methimazole .  Endocrinology following, prednisone  has been titrated off, now titrated off methimazole .  We had to restart her on amiodarone  with VF.  - Continue amiodarone  200 daily, recent LFTs and TFTs stable and will need regular eye exam.  3. PVCs/VT: EP consulted given complicated situation with hyperthyroidism and VT. She had a VF episode and amiodarone  was restarted.  - Quinidine is very expensive for her, I will check with EP regarding options (do we need to continue now that she is back on amiodarone ?).   - Continue amiodarone , follow TFTs closely.  4. CVA: CVA in 10/21, had thrombectomy with stent placement.  No LV thrombus noted and no atrial fibrillation noted.  - Would continue ASA 81 daily.   5. Anemia: CBC today.  6. CKD stage 3: Follow creatinine closely.  7. Mitral regurgitation: Moderate-severe functional MR pre-LVAD.  Unlikely to be Mitraclip candidate with severely dilated LV (>7 cm). Mitral regurgitation was mild on 7/24 echo and 12/24 echo.  8. Left ovarian mass: Hysterectomy + BSO.  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   9. Atrial fibrillation: Tolerates poorly. Has been in NSR.   Followup in 2 months.   I spent 42 minutes reviewing  records and LVAD interrogation, examining/interviewing patient, and organizing orders.    Peder Bourdon, MD  10/12/2023  [facility]  [address] [facility] [place] [address] [phone] (office) [phone] (fax)

## 2023-10-12 NOTE — Patient Instructions (Addendum)
 Start Entresto  24-29 Furoscix  today then start Torsemide  20mg  x 3 days then every other day Will reach out to Dr. Rodolfo Clan in regards to Quinidine  Return in 10 days for BMET Return in 2 months for full visit with Dr. Mitzie Anda

## 2023-10-13 LAB — HEPATIC FUNCTION PANEL
ALT: 39 U/L (ref 0–44)
AST: 40 U/L (ref 15–41)
Albumin: 3.5 g/dL (ref 3.5–5.0)
Alkaline Phosphatase: 54 U/L (ref 38–126)
Bilirubin, Direct: <0.1 mg/dL (ref 0.0–0.2)
Total Bilirubin: 0.4 mg/dL (ref 0.0–1.2)
Total Protein: 7.1 g/dL (ref 6.5–8.1)

## 2023-10-16 ENCOUNTER — Other Ambulatory Visit: Payer: Self-pay

## 2023-10-16 ENCOUNTER — Telehealth (HOSPITAL_COMMUNITY): Payer: Self-pay | Admitting: Pharmacy Technician

## 2023-10-16 ENCOUNTER — Other Ambulatory Visit (HOSPITAL_COMMUNITY): Payer: Self-pay

## 2023-10-16 MED FILL — Quinidine Gluconate Tab ER 324 MG: ORAL | 30 days supply | Qty: 60 | Fill #0 | Status: CN

## 2023-10-16 NOTE — Telephone Encounter (Signed)
 Advanced Heart Failure Patient Advocate Encounter  Prior Authorization for Quinidine Gluconate  has been approved.    PA# [id number] Effective dates: 10/16/23 through 06/22/24  Patients co-pay is $100 (30 days), $250 (90 days)  Called and spoke with patient. Noticed that she hasn't refilled medication lately. She stated that the co-pay is affordable and that she had extra medication on hand. Will be refilling with monthly refills in May.  Correne Dillon, CPhT

## 2023-10-16 NOTE — Telephone Encounter (Signed)
 Patient Advocate Encounter   Received notification from [employer] that prior authorization for quiniDINE gluconate  is required.   PA submitted on CoverMyMeds Key [id number] Status is pending   Will continue to follow.

## 2023-10-19 ENCOUNTER — Other Ambulatory Visit (HOSPITAL_COMMUNITY): Payer: Self-pay | Admitting: Unknown Physician Specialty

## 2023-10-19 DIAGNOSIS — Z7901 Long term (current) use of anticoagulants: Secondary | ICD-10-CM

## 2023-10-19 DIAGNOSIS — Z95811 Presence of heart assist device: Secondary | ICD-10-CM

## 2023-10-21 ENCOUNTER — Ambulatory Visit (INDEPENDENT_AMBULATORY_CARE_PROVIDER_SITE_OTHER): Payer: Medicare Other | Admitting: *Deleted

## 2023-10-21 VITALS — Ht 63.0 in | Wt 256.0 lb

## 2023-10-21 DIAGNOSIS — Z0000 Encounter for general adult medical examination without abnormal findings: Secondary | ICD-10-CM | POA: Diagnosis not present

## 2023-10-21 NOTE — Patient Instructions (Signed)
 Ms. Dieleman , Thank you for taking time to come for your Medicare Wellness Visit. I appreciate your ongoing commitment to your health goals. Please review the following plan we discussed and let me know if I can assist you in the future.   Referrals/Orders/Follow-Ups/Clinician Recommendations: Remember to update your tetanus and pneumonia vaccines. Check on the shingles vaccines.  This is a list of the screening recommended for you and due dates:  Health Maintenance  Topic Date Due   Pneumococcal Vaccination (1 of 2 - PCV) Never done   Zoster (Shingles) Vaccine (1 of 2) Never done   DTaP/Tdap/Td vaccine (2 - Td or Tdap) 01/26/2017   Flu Shot  01/22/2024   Medicare Annual Wellness Visit  10/20/2024   Mammogram  02/11/2025   Cologuard (Stool DNA test)  07/15/2025   Hepatitis C Screening  Completed   HIV Screening  Completed   HPV Vaccine  Aged Out   Meningitis B Vaccine  Aged Out   COVID-19 Vaccine  Discontinued    Advanced directives: (Declined) Advance directive discussed with you today. Even though you declined this today, please call our office should you change your mind, and we can give you the proper paperwork for you to fill out. Patient will work on this.  Next Medicare Annual Wellness Visit scheduled for next year: Yes 10/24/24 @ 10:50

## 2023-10-21 NOTE — Progress Notes (Signed)
 Subjective:   [name] is a 53 y.o. who presents for a Medicare Wellness preventive visit.  Visit Complete: Virtual I connected with  Blythe Waterloo on 10/21/23 by a audio enabled telemedicine application and verified that I am speaking with the correct person using two identifiers.  Patient Location: Home  Provider Location: Office/Clinic  I discussed the limitations of evaluation and management by telemedicine. The patient expressed understanding and agreed to proceed.  Vital Signs: Because this visit was a virtual/telehealth visit, some criteria may be missing or patient reported. Any vitals not documented were not able to be obtained and vitals that have been documented are patient reported.  VideoDeclined- This patient declined Librarian, academic. Therefore the visit was completed with audio only.  Persons Participating in Visit: Patient.  AWV Questionnaire: No: Patient Medicare AWV questionnaire was not completed prior to this visit.  Cardiac Risk Factors include: obesity (BMI >30kg/m2);dyslipidemia;hypertension     Objective:    Today's Vitals   10/21/23 1300  Weight: 256 lb (116.1 kg)  Height: 5\' 3"  (1.6 m)   Body mass index is 45.35 kg/m.     10/21/2023    1:12 PM 03/26/2023    9:13 AM 10/20/2022    1:36 PM 06/22/2022    4:09 AM 02/17/2022    9:03 PM 02/05/2022   10:58 AM 02/03/2022   10:52 AM  Advanced Directives  Does Patient Have a Medical Advance Directive? No No No No No No No  Would patient like information on creating a medical advance directive? No - Patient declined Yes (MAU/Ambulatory/Procedural Areas - Information given) No - Patient declined  No - Patient declined No - Patient declined No - Patient declined    Current Medications (verified) Outpatient Encounter Medications as of 10/21/2023  Medication Sig   ALPRAZolam  (XANAX ) 0.25 MG tablet Take 1 tablet (0.25 mg total) by mouth 2 (two) times daily as needed for anxiety.    amiodarone  (PACERONE ) 200 MG tablet Take 1 tablet (200 mg total) by mouth daily.   ASPIRIN  LOW DOSE 81 MG tablet TAKE ONE TABLET BY MOUTH ONCE A DAY   Cholecalciferol (VITAMIN D3) 125 MCG (5000 UT) CAPS Take 5,000 Units by mouth.   dapagliflozin  propanediol (FARXIGA ) 10 MG TABS tablet Take 1 tablet (10 mg total) by mouth daily before breakfast.   estradiol  (CLIMARA  - DOSED IN MG/24 HR) 0.0375 mg/24hr patch Place 1 patch (0.0375 mg total) onto the skin every 7 (seven) days.   potassium chloride  SA (KLOR-CON  M) 20 MEQ tablet Take 1 tablet (20 mEq total) by mouth daily.   quiniDINE gluconate  324 MG CR tablet Take 1 tablet (324 mg total) by mouth 2 (two) times daily.   rosuvastatin  (CRESTOR ) 40 MG tablet Take 1 tablet (40 mg total) by mouth daily.   sacubitril -valsartan  (ENTRESTO ) 24-26 MG Take 1 tablet by mouth 2 (two) times daily.   sildenafil  (REVATIO ) 20 MG tablet Take 1 tablet (20 mg total) by mouth 3 (three) times daily.   spironolactone  (ALDACTONE ) 25 MG tablet Take 1 tablet (25 mg total) by mouth daily.   torsemide  (DEMADEX ) 20 MG tablet Take 1 tablet (20 mg total) by mouth every other day. Take 1 tablet (20 mg) by mouth as needed, or as directed by heart failure clinic.   warfarin (COUMADIN ) 2 MG tablet Take 4 mg (2 tabs) daily or as directed by LVAD Clinic. (Patient taking differently: Take 2 mg by mouth daily at 4 PM.  2 mg (  2 mg x 1) every Tue, Thu, Sat; 4 mg (2 mg x 2) all other days or as directed by LVAD Clinic.)   No facility-administered encounter medications on file as of 10/21/2023.    Allergies (verified) Patient has no known allergies.   History: Past Medical History:  Diagnosis Date   Anemia    Automatic implantable cardioverter-defibrillator in situ    B12 deficiency    Cancer (HCC)    Chronic CHF (congestive heart failure) (HCC)    a. EF 15-20% b. RHC (09/2013) RA 14, RV 57/22, PA 64/36 (48), PCWP 18, FIck CO/CI 3.7/1.6, PVR 8.1 WU, PA sat 47%    Chronic kidney  disease    Heart valve problem    History of heart attack    History of hypothyroidism    History of stomach ulcers    HLD (hyperlipidemia)    Hyperthyroidism    Hypotension    Hypothyroidism    Morbid obesity (HCC)    Myocardial infarction (HCC) 08/2013   Nocturnal dyspnea    Nonischemic cardiomyopathy (HCC)    Palpitations    Sinus tachycardia    Sleep apnea    Snoring-prob OSA 09/04/2011   SOB (shortness of breath)    Sprint Fidelis ICD lead RECALL  6949    Stroke (HCC) 2021   some residual right-sided weakness   UARS (upper airway resistance syndrome) 09/04/2011   HST 12/2013:  AHI 4/hr (numerous episodes of airflow reduction that did not have concomitant desaturation)    Vitamin D  deficiency    Past Surgical History:  Procedure Laterality Date   BREATH TEK H PYLORI N/A 11/09/2014   Procedure: BREATH TEK H PYLORI;  Surgeon: Aldean Hummingbird, MD;  Location: [facility];  Service: General;  Laterality: N/A;   CARDIAC CATHETERIZATION  ~ 2006; 09/2013   CARDIAC CATHETERIZATION N/A 05/23/2016   Procedure: Right Heart Cath;  Surgeon: Darlis Eisenmenger, MD;  Location: [facility] INVASIVE CV LAB;  Service: Cardiovascular;  Laterality: N/A;   CARDIAC DEFIBRILLATOR PLACEMENT  2006; 12/26/2013   Medtronic [id number]; 12-2013 ICD gen change and RV lead revision with new 6935 RV lead by Dr Rodolfo Clan   CENTRAL LINE INSERTION  08/28/2021   Procedure: CENTRAL LINE INSERTION;  Surgeon: Darlis Eisenmenger, MD;  Location: [facility] INVASIVE CV LAB;  Service: Cardiovascular;;   CESAREAN SECTION  06/23/1997   IMPLANTABLE CARDIOVERTER DEFIBRILLATOR GENERATOR CHANGE N/A 12/26/2013   Procedure: IMPLANTABLE CARDIOVERTER DEFIBRILLATOR GENERATOR CHANGE;  Surgeon: Verona Goodwill, MD;  Location: [facility] CATH LAB;  Service: Cardiovascular;  Laterality: N/A;   INSERTION OF IMPLANTABLE LEFT VENTRICULAR ASSIST DEVICE N/A 09/03/2021   Procedure: INSERTION OF IMPLANTABLE LEFT VENTRICULAR ASSIST DEVICE/ Heartmate 3;  Surgeon: Shon Downing, MD;  Location: [facility] OR;  Service: Open Heart Surgery;  Laterality: N/A;   IR CT HEAD LTD  04/17/2020   IR CT HEAD LTD  04/17/2020   IR INTRA CRAN STENT  04/17/2020   IR PERCUTANEOUS ART THROMBECTOMY/INFUSION INTRACRANIAL INC DIAG ANGIO  04/17/2020   IR RADIOLOGIST EVAL & MGMT  06/05/2020   LEAD REVISION N/A 12/26/2013   Procedure: LEAD REVISION;  Surgeon: Verona Goodwill, MD;  Location: [facility] CATH LAB;  Service: Cardiovascular;  Laterality: N/A;   LEFT VENTRICULAR ASSIST DEVICE     PLACEMENT OF IMPELLA LEFT VENTRICULAR ASSIST DEVICE Right 08/29/2021   Procedure: PLACEMENT OF IMPELLA 5.5 LEFT VENTRICULAR ASSIST DEVICE;  Surgeon: Bartley Lightning, MD;  Location: [facility] OR;  Service: Open Heart Surgery;  Laterality: Right;  RADIOLOGY WITH ANESTHESIA N/A 04/17/2020   Procedure: Code Stroke;  Surgeon: Luellen Sages, MD;  Location: [facility] OR;  Service: Radiology;  Laterality: N/A;   REMOVAL OF IMPELLA LEFT VENTRICULAR ASSIST DEVICE N/A 09/03/2021   Procedure: REMOVAL OF IMPELLA LEFT VENTRICULAR ASSIST DEVICE;  Surgeon: Shon Downing, MD;  Location: [facility] OR;  Service: Open Heart Surgery;  Laterality: N/A;   RIGHT HEART CATH N/A 08/28/2021   Procedure: RIGHT HEART CATH;  Surgeon: Darlis Eisenmenger, MD;  Location: [facility] INVASIVE CV LAB;  Service: Cardiovascular;  Laterality: N/A;   RIGHT HEART CATHETERIZATION N/A 02/15/2014   Procedure: RIGHT HEART CATH;  Surgeon: Darlis Eisenmenger, MD;  Location: [facility] CATH LAB;  Service: Cardiovascular;  Laterality: N/A;   ROBOTIC ASSISTED TOTAL HYSTERECTOMY WITH BILATERAL SALPINGO OOPHERECTOMY N/A 02/05/2022   Procedure: XI ROBOTIC ASSISTED TOTAL HYSTERECTOMY WITH BILATERAL SALPINGO OOPHORECTOMY, CYSTOSCOPY, UTERUS GREATER THAN 250 GRAMS;  Surgeon: Suzi Essex, MD;  Location: [facility] OR;  Service: Gynecology;  Laterality: N/A;   TEE WITHOUT CARDIOVERSION N/A 08/29/2021   Procedure: TRANSESOPHAGEAL ECHOCARDIOGRAM (TEE);  Surgeon: Bartley Lightning, MD;  Location: [facility] OR;   Service: Open Heart Surgery;  Laterality: N/A;   TEE WITHOUT CARDIOVERSION N/A 09/03/2021   Procedure: TRANSESOPHAGEAL ECHOCARDIOGRAM (TEE);  Surgeon: Shon Downing, MD;  Location: [facility] OR;  Service: Open Heart Surgery;  Laterality: N/A;   TUBAL LIGATION  06/23/1997   Family History  Problem Relation Age of Onset   Hypertension Mother    Heart disease Mother    Obesity Mother    Heart Problems Mother    Hypertension Father    High blood pressure Father    Stroke Father    Heart disease Maternal Grandmother    Colon cancer Neg Hx    Breast cancer Neg Hx    Ovarian cancer Neg Hx    Endometrial cancer Neg Hx    Pancreatic cancer Neg Hx    Prostate cancer Neg Hx    Liver disease Neg Hx    Cancer - Colon Neg Hx    Social History   Socioeconomic History   Marital status: Married    Spouse name: Cait Reddig   Number of children: 2   Years of education: Not on file   Highest education level: Not on file  Occupational History   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  Tobacco Use   Smoking status: Never   Smokeless tobacco: Never  Vaping Use   Vaping status: Never Used  Substance and Sexual Activity   Alcohol use: No   Drug use: No   Sexual activity: Not Currently  Other Topics Concern   Not on file  Social History Narrative   Albin Huh sometimes   Right handed   Social Drivers of Health   Financial Resource Strain: Low Risk  (10/21/2023)   Overall Financial Resource Strain (CARDIA)    Difficulty of Paying Living Expenses: Not hard at all  Food Insecurity: No Food Insecurity (10/21/2023)   Hunger Vital Sign    Worried About Running Out of Food in the Last Year: Never true    Ran Out of Food in the Last Year: Never true  Transportation Needs: No Transportation Needs (10/21/2023)   PRAPARE - Administrator, Civil Service (Medical): No    Lack of Transportation (Non-Medical): No  Physical Activity: Insufficiently Active (10/21/2023)   Exercise Vital Sign     Days of Exercise per Week: 2 days    Minutes of Exercise  per Session: 40 min  Stress: No Stress Concern Present (10/21/2023)   [employer] of Occupational Health - Occupational Stress Questionnaire    Feeling of Stress : Only a little  Social Connections: Socially Integrated (10/21/2023)   Social Connection and Isolation Panel [NHANES]    Frequency of Communication with Friends and Family: More than three times a week    Frequency of Social Gatherings with Friends and Family: More than three times a week    Attends Religious Services: More than 4 times per year    Active Member of [employer] or Organizations: Yes    Attends Engineer, structural: More than 4 times per year    Marital Status: Married    Tobacco Counseling Counseling given: Not Answered    Clinical Intake:  Pre-visit preparation completed: Yes  Pain : No/denies pain     BMI - recorded: 45.35 Nutritional Status: BMI > 30  Obese Nutritional Risks: None Diabetes: No  Lab Results  Component Value Date   HGBA1C 5.5 01/02/2023   HGBA1C 5.6 09/08/2022   HGBA1C 5.4 04/09/2022     How often do you need to have someone help you when you read instructions, pamphlets, or other written materials from your doctor or pharmacy?: 1 - Never  Interpreter Needed?: No  Information entered by :: R. Nestor Wieneke LPN   Activities of Daily Living     10/21/2023    1:02 PM  In your present state of health, do you have any difficulty performing the following activities:  Hearing? 0  Vision? 0  Difficulty concentrating or making decisions? 0  Walking or climbing stairs? 0  Dressing or bathing? 0  Doing errands, shopping? 0  Preparing Food and eating ? N  Using the Toilet? N  In the past six months, have you accidently leaked urine? N  Do you have problems with loss of bowel control? N  Managing your Medications? N  Managing your Finances? N  Housekeeping or managing your Housekeeping? N    Patient Care  Team: Bluford Burkitt, NP as PCP - General (Nurse Practitioner) Darlis Eisenmenger, MD as PCP - Cardiology (Cardiology) Verona Goodwill, MD as PCP - Electrophysiology (Cardiology) Jacqueline Matsu, MD as PCP - Sleep Medicine (Cardiology)  Indicate any recent Medical Services you may have received from other than [facility] providers in the past year (date may be approximate).     Assessment:   This is a routine wellness examination for [name].  Hearing/Vision screen Hearing Screening - Comments:: No issues Vision Screening - Comments:: readers   Goals Addressed             This Visit's Progress    Patient Stated       Wants to lose weight and get on heart transplant list       Depression Screen     10/21/2023    1:07 PM 07/07/2023    8:24 AM 03/26/2023    9:13 AM 10/20/2022    1:46 PM 07/04/2022    8:59 AM 04/09/2022   10:04 AM 02/28/2022    9:38 AM  PHQ 2/9 Scores  PHQ - 2 Score 0 0 0 0 0 1 0  PHQ- 9 Score 2 2   2 4 2     Fall Risk     10/21/2023    1:04 PM 03/26/2023    9:12 AM 10/20/2022    1:38 PM 07/04/2022    8:59 AM 11/01/2021    2:06 PM  Fall  Risk   Falls in the past year? 0 0 0 0 0  Number falls in past yr: 0  0 0   Injury with Fall? 0  0 0   Risk for fall due to : No Fall Risks   No Fall Risks Medication side effect  Follow up Falls prevention discussed;Falls evaluation completed  Falls evaluation completed;Falls prevention discussed Falls evaluation completed Education provided;Falls prevention discussed    MEDICARE RISK AT HOME:  Medicare Risk at Home Any stairs in or around the home?: Yes If so, are there any without handrails?: No Home free of loose throw rugs in walkways, pet beds, electrical cords, etc?: Yes Adequate lighting in your home to reduce risk of falls?: Yes Life alert?: Yes Use of a cane, walker or w/c?: No Grab bars in the bathroom?: No Shower chair or bench in shower?: Yes Elevated toilet seat or a handicapped toilet?: Yes  TIMED UP AND  GO:  Was the test performed?  No  Cognitive Function: 6CIT completed        10/21/2023    1:13 PM 10/20/2022    1:50 PM  6CIT Screen  What Year? 0 points 0 points  What month? 0 points 0 points  What time? 0 points 0 points  Count back from 20 0 points 0 points  Months in reverse 0 points 0 points  Repeat phrase 0 points 0 points  Total Score 0 points 0 points    Immunizations Immunization History  Administered Date(s) Administered   Influenza Split 03/23/2013, 04/24/2015   Influenza, Quadrivalent, Recombinant, Inj, Pf 04/28/2019   Influenza, Seasonal, Injecte, Preservative Fre 07/07/2023   Influenza-Unspecified 02/25/2019, 04/17/2022   Tdap 01/27/2007    Screening Tests Health Maintenance  Topic Date Due   Pneumococcal Vaccine 25-58 Years old (1 of 2 - PCV) Never done   Zoster Vaccines- Shingrix (1 of 2) Never done   Colonoscopy  Never done   DTaP/Tdap/Td (2 - Td or Tdap) 01/26/2017   Medicare Annual Wellness (AWV)  10/20/2023   INFLUENZA VACCINE  01/22/2024   MAMMOGRAM  02/11/2025   Hepatitis C Screening  Completed   HIV Screening  Completed   HPV VACCINES  Aged Out   Meningococcal B Vaccine  Aged Out   COVID-19 Vaccine  Discontinued    Health Maintenance  Health Maintenance Due  Topic Date Due   Pneumococcal Vaccine 80-9 Years old (1 of 2 - PCV) Never done   Zoster Vaccines- Shingrix (1 of 2) Never done   Colonoscopy  Never done   DTaP/Tdap/Td (2 - Td or Tdap) 01/26/2017   Medicare Annual Wellness (AWV)  10/20/2023   Health Maintenance Items Addressed: Discussed with patient  the need to update tetanus, pneumonia, and shingles vaccine. Patient had a virtual colonoscopy 10/2022 and will need to repeat in 5 years.  Additional Screening:  Vision Screening: Recommended annual ophthalmology exams for early detection of glaucoma and other disorders of the eye. [facility], is overdue but will call and schedule an appointment.  Dental Screening:  Recommended annual dental exams for proper oral hygiene  Community Resource Referral / Chronic Care Management: CRR required this visit?  No   CCM required this visit?  No     Plan:     I have personally reviewed and noted the following in the patient's chart:   Medical and social history Use of alcohol, tobacco or illicit drugs  Current medications and supplements including opioid prescriptions. Patient is not currently taking opioid  prescriptions. Functional ability and status Nutritional status Physical activity Advanced directives List of other physicians Hospitalizations, surgeries, and ER visits in previous 12 months Vitals Screenings to include cognitive, depression, and falls Referrals and appointments  In addition, I have reviewed and discussed with patient certain preventive protocols, quality metrics, and best practice recommendations. A written personalized care plan for preventive services as well as general preventive health recommendations were provided to patient.     Felicitas Horse, LPN   0/45/4098   After Visit Summary: (MyChart) Due to this being a telephonic visit, the after visit summary with patients personalized plan was offered to patient via MyChart   Notes: Nothing significant to report at this time.

## 2023-10-22 ENCOUNTER — Ambulatory Visit (HOSPITAL_COMMUNITY)
Admission: RE | Admit: 2023-10-22 | Discharge: 2023-10-22 | Disposition: A | Discharge status: Home / Self Care | Source: Ambulatory Visit | Attending: Cardiology | Admitting: Cardiology

## 2023-10-22 ENCOUNTER — Ambulatory Visit (HOSPITAL_COMMUNITY): Payer: Self-pay | Admitting: Pharmacist

## 2023-10-22 DIAGNOSIS — Z7901 Long term (current) use of anticoagulants: Secondary | ICD-10-CM | POA: Diagnosis not present

## 2023-10-22 DIAGNOSIS — Z95811 Presence of heart assist device: Secondary | ICD-10-CM | POA: Diagnosis not present

## 2023-10-22 LAB — BASIC METABOLIC PANEL WITH GFR
Anion gap: 7 (ref 5–15)
BUN: 19 mg/dL (ref 6–20)
CO2: 22 mmol/L (ref 22–32)
Calcium: 9.2 mg/dL (ref 8.9–10.3)
Chloride: 106 mmol/L (ref 98–111)
Creatinine, Ser: 1.39 mg/dL — ABNORMAL HIGH (ref 0.44–1.00)
GFR, Estimated: 46 mL/min — ABNORMAL LOW (ref >60)
Glucose, Bld: 96 mg/dL (ref 70–99)
Potassium: 4.4 mmol/L (ref 3.5–5.1)
Sodium: 135 mmol/L (ref 135–145)

## 2023-10-22 LAB — POCT INR: INR: 2.4 (ref 2.0–3.0)

## 2023-10-26 ENCOUNTER — Other Ambulatory Visit: Payer: Self-pay

## 2023-10-26 ENCOUNTER — Telehealth (HOSPITAL_COMMUNITY): Payer: Self-pay | Admitting: *Deleted

## 2023-10-26 DIAGNOSIS — Z95811 Presence of heart assist device: Secondary | ICD-10-CM

## 2023-10-26 DIAGNOSIS — I10 Essential (primary) hypertension: Secondary | ICD-10-CM

## 2023-10-26 MED ORDER — AMLODIPINE BESYLATE 2.5 MG PO TABS
2.5000 mg | ORAL_TABLET | Freq: Every day | ORAL | 3 refills | Status: DC
  Filled 2023-10-26: qty 90, 90d supply, fill #0

## 2023-10-26 MED FILL — Quinidine Gluconate Tab ER 324 MG: ORAL | 30 days supply | Qty: 60 | Fill #0 | Status: AC

## 2023-10-26 NOTE — Telephone Encounter (Signed)
 Pt called and reported over the weekend she started dropping to low speed limit after recently starting Entresto . Reports intermittent lightheadedness, dizziness, and feeling poorly with speed drops. VAD #s: Speed: 5800 Flow: 5.1 PI: 4.0 Power: 5.7 w after drinking several bottles of water  this morning. No low flows noted.  Discussed with Dr Mitzie Anda. Order received to stop Entresto , and start Amlodipine 2.5 mg daily. Will bring to clinic for BP check on Thursday. Prescription sent to pt's pharmacy.   Called pt and discussed the above medication changes. BP check scheduled 10/29/23 at 10:00 AM. Pt verbalized understanding.   Paulo Bosworth RN VAD Coordinator  Office: [phone]  24/7 Pager: [phone]

## 2023-10-29 ENCOUNTER — Ambulatory Visit (HOSPITAL_COMMUNITY)
Admission: RE | Admit: 2023-10-29 | Discharge: 2023-10-29 | Disposition: A | Discharge status: Home / Self Care | Source: Ambulatory Visit | Attending: Internal Medicine | Admitting: Internal Medicine

## 2023-10-29 ENCOUNTER — Other Ambulatory Visit: Payer: Self-pay

## 2023-10-29 ENCOUNTER — Ambulatory Visit (HOSPITAL_COMMUNITY): Payer: Self-pay | Admitting: Pharmacist

## 2023-10-29 DIAGNOSIS — Z4801 Encounter for change or removal of surgical wound dressing: Secondary | ICD-10-CM | POA: Insufficient documentation

## 2023-10-29 DIAGNOSIS — Z4509 Encounter for adjustment and management of other cardiac device: Secondary | ICD-10-CM | POA: Insufficient documentation

## 2023-10-29 DIAGNOSIS — I509 Heart failure, unspecified: Secondary | ICD-10-CM | POA: Diagnosis not present

## 2023-10-29 DIAGNOSIS — Z79899 Other long term (current) drug therapy: Secondary | ICD-10-CM | POA: Diagnosis not present

## 2023-10-29 DIAGNOSIS — I10 Essential (primary) hypertension: Secondary | ICD-10-CM | POA: Insufficient documentation

## 2023-10-29 DIAGNOSIS — Z95811 Presence of heart assist device: Secondary | ICD-10-CM

## 2023-10-29 LAB — BASIC METABOLIC PANEL WITH GFR
Anion gap: 9 (ref 5–15)
BUN: 14 mg/dL (ref 6–20)
CO2: 21 mmol/L — ABNORMAL LOW (ref 22–32)
Calcium: 9 mg/dL (ref 8.9–10.3)
Chloride: 106 mmol/L (ref 98–111)
Creatinine, Ser: 1.31 mg/dL — ABNORMAL HIGH (ref 0.44–1.00)
GFR, Estimated: 49 mL/min — ABNORMAL LOW (ref >60)
Glucose, Bld: 69 mg/dL — ABNORMAL LOW (ref 70–99)
Potassium: 4 mmol/L (ref 3.5–5.1)
Sodium: 136 mmol/L (ref 135–145)

## 2023-10-29 LAB — CBC
HCT: 46.6 % — ABNORMAL HIGH (ref 36.0–46.0)
Hemoglobin: 14.2 g/dL (ref 12.0–15.0)
MCH: 25 pg — ABNORMAL LOW (ref 26.0–34.0)
MCHC: 30.5 g/dL (ref 30.0–36.0)
MCV: 81.9 fL (ref 80.0–100.0)
Platelets: 232 10*3/uL (ref 150–400)
RBC: 5.69 MIL/uL — ABNORMAL HIGH (ref 3.87–5.11)
RDW: 16.3 % — ABNORMAL HIGH (ref 11.5–15.5)
WBC: 4.7 10*3/uL (ref 4.0–10.5)
nRBC: 0 % (ref 0.0–0.2)

## 2023-10-29 LAB — POCT INR: INR: 2 (ref 2.0–3.0)

## 2023-10-29 LAB — LACTATE DEHYDROGENASE: LDH: 192 U/L (ref 98–192)

## 2023-10-29 MED ORDER — AMLODIPINE BESYLATE 5 MG PO TABS
5.0000 mg | ORAL_TABLET | Freq: Every day | ORAL | 3 refills | Status: AC
Start: 2023-10-29 — End: 2024-08-09
  Filled 2023-10-29: qty 90, 90d supply, fill #0
  Filled 2023-12-01 – 2024-01-07 (×2): qty 90, 90d supply, fill #1
  Filled 2024-02-29 – 2024-05-09 (×2): qty 90, 90d supply, fill #2
  Filled 2024-07-06: qty 90, 90d supply, fill #3

## 2023-10-29 NOTE — Progress Notes (Signed)
 Pt presented for BP check in VAD Clinic today alone. Reports no problems with VAD equipment or concerns with drive line.  Pt called on Monday reporting over the weekend she started dropping to low speed limit after recently starting Entresto . Reported intermittent lightheadedness, dizziness, and feeling poorly with speed drops. Per Dr Mitzie Anda order received to stop Entresto  and start Amlodipine 2.5 mg daily.   Denies lightheadedness, dizziness, falls or any signs of bleeding today. States she has felt much better since stopping Entresto . States she has had rare speed drops since stopping Entresto . BP remains mildly elevated today. Discussed with Dr Mitzie Anda- order received to obtain labs today, and increase Amlodipine to 5 mg daily. Will repeat labs and BP check in 2 weeks. Updated prescription sent to pt's pharmacy. Pt verbalized understanding of medication change.   Pt reports when she changed her drive line dressing yesterday there was a small amount of bloody drainage after a scab fell off. Requesting VAD coordinator check drive line today. Dressing changed- see documentation below.   Vital Signs:  Doppler Pressure: not done Automatic BP: 108/97 (102) HR: 73 SR  SPO2: 98%   Weight: 265.5 lbs w/o eqt Last weight: 272.4 lb w/o eqt  VAD Indication: Destination Therapy d/t BMI   VAD interrogation & Equipment Management: Speed: 5800 Flow: 5.6 Power: 4.7 w    PI: 2.6   Alarms: none Events: 60+ daily  Fixed speed: 5800 Low speed limit: 5500   Primary Controller: Replace back up battery in 33 months. Back up controller: Replace back up battery in 33 months.   Annual Equipment Maintenance on UBC/PM was performed on 08/03/23.    I reviewed the LVAD parameters from today and compared the results to the patient's prior recorded data. LVAD interrogation was NEGATIVE for significant power changes, NEGATIVE for clinical alarms and STABLE for PI events/speed drops. No programming changes were  made and pump is functioning within specified parameters. Pt is performing daily controller and system monitor self tests along with completing weekly and monthly maintenance for LVAD equipment.   LVAD equipment check completed and is in good working order. Back-up equipment present. Charged back up battery and performed self-test on equipment.   Exit Site Care: Existing VAD dressing removed and site care performed using sterile technique. Drive line exit site cleaned with Chlora prep applicators x 2, allowed to dry, and Sorbaview dressing with Silverlon patch applied. Exit site healed and incorporated, the velour is fully implanted at exit site. Small scab at exit site fell off with cleansing. No redness, tenderness, drainage, foul odor or rash noted. Drive line anchor re-applied. Pt denies fever or chills.    Significant Events on VAD Support:  N/A  Device: Medtronic Therapies: on VF 240 VT 200 VVI: 40 Last check: 08/08/23  BP & Labs:  Doppler - Doppler correlated with MAP (not done today)   Hgb 14.2 - No S/S of bleeding. Specifically denies vaginal bleeding, melena/BRBPR or nosebleeds.    LDH stable at 192 with established baseline of 250 - 450. Denies tea-colored urine. No power elevations noted on interrogation.   Plan:  Increase Amlodipine to 5 mg daily Return to clinic in 2 weeks for BMET and BP check  Paulo Bosworth RN VAD Coordinator  Office: [phone]  24/7 Pager: [phone]

## 2023-10-29 NOTE — Patient Instructions (Signed)
 Increase Amlodipine to 5 mg daily Return to clinic in 2 weeks for BMET and BP check

## 2023-11-05 ENCOUNTER — Ambulatory Visit (INDEPENDENT_AMBULATORY_CARE_PROVIDER_SITE_OTHER): Payer: Medicare Other

## 2023-11-05 ENCOUNTER — Ambulatory Visit (HOSPITAL_COMMUNITY): Payer: Self-pay | Admitting: Pharmacist

## 2023-11-05 ENCOUNTER — Ambulatory Visit (INDEPENDENT_AMBULATORY_CARE_PROVIDER_SITE_OTHER): Admitting: Family Medicine

## 2023-11-05 DIAGNOSIS — I428 Other cardiomyopathies: Secondary | ICD-10-CM

## 2023-11-05 LAB — CUP PACEART REMOTE DEVICE CHECK
Battery Remaining Longevity: 18 mo
Battery Voltage: 2.92 V
Brady Statistic RV Percent Paced: 0.01 %
Date Time Interrogation Session: 20250515001603
HighPow Impedance: 73 Ohm
Implantable Lead Connection Status: 753985
Implantable Lead Implant Date: 20150706
Implantable Lead Location: 753860
Implantable Pulse Generator Implant Date: 20150706
Lead Channel Impedance Value: 342 Ohm
Lead Channel Impedance Value: 456 Ohm
Lead Channel Pacing Threshold Amplitude: 0.875 V
Lead Channel Pacing Threshold Pulse Width: 0.4 ms
Lead Channel Sensing Intrinsic Amplitude: 4.875 mV
Lead Channel Sensing Intrinsic Amplitude: 4.875 mV
Lead Channel Setting Pacing Amplitude: 2 V
Lead Channel Setting Pacing Pulse Width: 0.4 ms
Lead Channel Setting Sensing Sensitivity: 0.3 mV

## 2023-11-05 LAB — POCT INR: INR: 2.5 (ref 2.0–3.0)

## 2023-11-06 ENCOUNTER — Other Ambulatory Visit (HOSPITAL_COMMUNITY): Payer: Self-pay

## 2023-11-06 DIAGNOSIS — Z95811 Presence of heart assist device: Secondary | ICD-10-CM

## 2023-11-06 DIAGNOSIS — Z7901 Long term (current) use of anticoagulants: Secondary | ICD-10-CM

## 2023-11-09 ENCOUNTER — Emergency Department (HOSPITAL_COMMUNITY)
Admission: EM | Admit: 2023-11-09 | Discharge: 2023-11-09 | Disposition: A | Discharge status: Home / Self Care | Attending: Emergency Medicine | Admitting: Emergency Medicine

## 2023-11-09 ENCOUNTER — Encounter (HOSPITAL_COMMUNITY)

## 2023-11-09 ENCOUNTER — Emergency Department (HOSPITAL_COMMUNITY)

## 2023-11-09 ENCOUNTER — Other Ambulatory Visit: Payer: Self-pay

## 2023-11-09 ENCOUNTER — Encounter (HOSPITAL_COMMUNITY): Payer: Self-pay | Admitting: Emergency Medicine

## 2023-11-09 DIAGNOSIS — R519 Headache, unspecified: Secondary | ICD-10-CM | POA: Diagnosis not present

## 2023-11-09 DIAGNOSIS — I110 Hypertensive heart disease with heart failure: Secondary | ICD-10-CM | POA: Diagnosis not present

## 2023-11-09 DIAGNOSIS — I509 Heart failure, unspecified: Secondary | ICD-10-CM | POA: Insufficient documentation

## 2023-11-09 DIAGNOSIS — I10 Essential (primary) hypertension: Secondary | ICD-10-CM | POA: Diagnosis not present

## 2023-11-09 DIAGNOSIS — Z7982 Long term (current) use of aspirin: Secondary | ICD-10-CM | POA: Diagnosis not present

## 2023-11-09 DIAGNOSIS — J0100 Acute maxillary sinusitis, unspecified: Secondary | ICD-10-CM | POA: Diagnosis not present

## 2023-11-09 DIAGNOSIS — Z7901 Long term (current) use of anticoagulants: Secondary | ICD-10-CM | POA: Diagnosis not present

## 2023-11-09 LAB — COMPREHENSIVE METABOLIC PANEL WITH GFR
ALT: 21 U/L (ref 0–44)
AST: 21 U/L (ref 15–41)
Albumin: 3.4 g/dL — ABNORMAL LOW (ref 3.5–5.0)
Alkaline Phosphatase: 68 U/L (ref 38–126)
Anion gap: 7 (ref 5–15)
BUN: 12 mg/dL (ref 6–20)
CO2: 24 mmol/L (ref 22–32)
Calcium: 9.4 mg/dL (ref 8.9–10.3)
Chloride: 107 mmol/L (ref 98–111)
Creatinine, Ser: 1.3 mg/dL — ABNORMAL HIGH (ref 0.44–1.00)
GFR, Estimated: 49 mL/min — ABNORMAL LOW (ref >60)
Glucose, Bld: 83 mg/dL (ref 70–99)
Potassium: 4.6 mmol/L (ref 3.5–5.1)
Sodium: 138 mmol/L (ref 135–145)
Total Bilirubin: 0.6 mg/dL (ref 0.0–1.2)
Total Protein: 7.5 g/dL (ref 6.5–8.1)

## 2023-11-09 LAB — CBC WITH DIFFERENTIAL/PLATELET
Abs Immature Granulocytes: 0.04 10*3/uL (ref 0.00–0.07)
Basophils Absolute: 0 10*3/uL (ref 0.0–0.1)
Basophils Relative: 0 %
Eosinophils Absolute: 0.1 10*3/uL (ref 0.0–0.5)
Eosinophils Relative: 1 %
HCT: 45.5 % (ref 36.0–46.0)
Hemoglobin: 13.9 g/dL (ref 12.0–15.0)
Immature Granulocytes: 0 %
Lymphocytes Relative: 18 %
Lymphs Abs: 1.7 10*3/uL (ref 0.7–4.0)
MCH: 25.1 pg — ABNORMAL LOW (ref 26.0–34.0)
MCHC: 30.5 g/dL (ref 30.0–36.0)
MCV: 82.1 fL (ref 80.0–100.0)
Monocytes Absolute: 1 10*3/uL (ref 0.1–1.0)
Monocytes Relative: 10 %
Neutro Abs: 6.7 10*3/uL (ref 1.7–7.7)
Neutrophils Relative %: 71 %
Platelets: 251 10*3/uL (ref 150–400)
RBC: 5.54 MIL/uL — ABNORMAL HIGH (ref 3.87–5.11)
RDW: 16.1 % — ABNORMAL HIGH (ref 11.5–15.5)
WBC: 9.6 10*3/uL (ref 4.0–10.5)
nRBC: 0 % (ref 0.0–0.2)

## 2023-11-09 LAB — PROTIME-INR
INR: 2 — ABNORMAL HIGH (ref 0.8–1.2)
Prothrombin Time: 23.3 s — ABNORMAL HIGH (ref 11.4–15.2)

## 2023-11-09 MED ORDER — OXYCODONE HCL 5 MG PO TABS
5.0000 mg | ORAL_TABLET | Freq: Once | ORAL | Status: AC
  Administered 2023-11-09: 5 mg via ORAL
  Filled 2023-11-09: qty 1

## 2023-11-09 MED ORDER — AMOXICILLIN-POT CLAVULANATE 875-125 MG PO TABS: 1.0000 | ORAL_TABLET | Freq: Two times a day (BID) | ORAL | 0 refills | Status: AC

## 2023-11-09 MED ORDER — OXYCODONE-ACETAMINOPHEN 5-325 MG PO TABS
1.0000 | ORAL_TABLET | Freq: Three times a day (TID) | ORAL | 0 refills | Status: AC | PRN
Start: 2023-11-09 — End: 2023-11-12

## 2023-11-09 MED ORDER — ACETAMINOPHEN 325 MG PO TABS
650.0000 mg | ORAL_TABLET | Freq: Once | ORAL | Status: AC
  Administered 2023-11-09: 650 mg via ORAL
  Filled 2023-11-09: qty 2

## 2023-11-09 NOTE — ED Notes (Signed)
 Doppler BP 98 mmHg

## 2023-11-09 NOTE — ED Provider Notes (Signed)
 [place] EMERGENCY DEPARTMENT AT [facility] Provider Note   CSN: [id number] Arrival date & time: 11/09/23  8295     History  Chief Complaint  Patient presents with   Headache   LVAD/Headache    [name] is a 53 y.o. female with a history of stroke, CHF, with LVAD present who presents to the ED today for headache.  Patient reports headache at the left temple for the past 3 days.  She did have an episode of right-sided transient blurred vision the day the symptoms started but this is since resolved.  Denies any weakness, slurred speech, or confusion.  Due to her history of stroke, patient was advised to come to the ED for further evaluation.  No chest pain or shortness of breath.  Was recently started on amlodipine  for blood pressure, does not know if this medication could be playing a role in her headache.    Home Medications Prior to Admission medications   Medication Sig Start Date End Date Taking? Authorizing Provider  amoxicillin -clavulanate (AUGMENTIN ) 875-125 MG tablet Take 1 tablet by mouth every 12 (twelve) hours for 7 days. 11/09/23 11/16/23 Yes Sonnie Dusky, PA-C  oxyCODONE -acetaminophen  (PERCOCET/ROXICET) 5-325 MG tablet Take 1 tablet by mouth every 8 (eight) hours as needed for up to 3 days for severe pain (pain score 7-10). 11/09/23 11/12/23 Yes Sonnie Dusky, PA-C  ALPRAZolam  (XANAX ) 0.25 MG tablet Take 1 tablet (0.25 mg total) by mouth 2 (two) times daily as needed for anxiety. 09/25/23   Bluford Burkitt, NP  amiodarone  (PACERONE ) 200 MG tablet Take 1 tablet (200 mg total) by mouth daily. 03/17/23   Darlis Eisenmenger, MD  amLODipine  (NORVASC ) 5 MG tablet Take 1 tablet (5 mg total) by mouth daily. 10/29/23 01/27/24  Darlis Eisenmenger, MD  ASPIRIN  LOW DOSE 81 MG tablet TAKE ONE TABLET BY MOUTH ONCE A DAY 04/01/23   Darlis Eisenmenger, MD  Cholecalciferol (VITAMIN D3) 125 MCG (5000 UT) CAPS Take 5,000 Units by mouth.    [provider]  dapagliflozin  propanediol  (FARXIGA ) 10 MG TABS tablet Take 1 tablet (10 mg total) by mouth daily before breakfast. 11/10/22   Darlis Eisenmenger, MD  estradiol  (CLIMARA  - DOSED IN MG/24 HR) 0.0375 mg/24hr patch Place 1 patch (0.0375 mg total) onto the skin every 7 (seven) days. 09/25/23   Suzi Essex, MD  potassium chloride  SA (KLOR-CON  M) 20 MEQ tablet Take 1 tablet (20 mEq total) by mouth daily. 03/17/23   Darlis Eisenmenger, MD  quiniDINE gluconate  324 MG CR tablet Take 1 tablet (324 mg total) by mouth 2 (two) times daily. 07/07/23   Darlis Eisenmenger, MD  rosuvastatin  (CRESTOR ) 40 MG tablet Take 1 tablet (40 mg total) by mouth daily. 11/06/22   Bluford Burkitt, NP  sildenafil  (REVATIO ) 20 MG tablet Take 1 tablet (20 mg total) by mouth 3 (three) times daily. 11/10/22   Darlis Eisenmenger, MD  spironolactone  (ALDACTONE ) 25 MG tablet Take 1 tablet (25 mg total) by mouth daily. 04/28/23 04/22/24  Darlis Eisenmenger, MD  torsemide  (DEMADEX ) 20 MG tablet Take 1 tablet (20 mg total) by mouth every other day. Take 1 tablet (20 mg) by mouth as needed, or as directed by heart failure clinic. Patient not taking: Reported on 10/29/2023 11/10/22   Darlis Eisenmenger, MD  warfarin (COUMADIN ) 2 MG tablet Take 4 mg (2 tabs) daily or as directed by LVAD Clinic. Patient taking differently: Take 2 mg by mouth daily  at 4 PM.  2 mg (2 mg x 1) every Tue, Thu, Sat; 4 mg (2 mg x 2) all other days or as directed by LVAD Clinic. 01/22/23   Bensimhon, Rheta Celestine, MD      Allergies    Patient has no known allergies.    Review of Systems   Review of Systems  Neurological:  Positive for headaches.  All other systems reviewed and are negative.   Physical Exam Updated Vital Signs Pulse 72   Temp 98.2 F (36.8 C)   Resp 18   Ht 5\' 3"  (1.6 m)   Wt 120.2 kg   LMP 03/28/2021 (Approximate)   SpO2 100%   BMI 46.94 kg/m  Physical Exam Vitals and nursing note reviewed.  Constitutional:      General: She is not in acute distress.    Appearance: Normal  appearance.  HENT:     Head: Normocephalic and atraumatic.     Mouth/Throat:     Mouth: Mucous membranes are moist.  Eyes:     Conjunctiva/sclera: Conjunctivae normal.     Pupils: Pupils are equal, round, and reactive to light.  Cardiovascular:     Rate and Rhythm: Normal rate and regular rhythm.     Pulses: Normal pulses.  Pulmonary:     Effort: Pulmonary effort is normal.     Breath sounds: Normal breath sounds.  Abdominal:     Palpations: Abdomen is soft.     Tenderness: There is no abdominal tenderness.  Skin:    General: Skin is warm and dry.     Findings: No rash.  Neurological:     General: No focal deficit present.     Mental Status: She is alert.  Psychiatric:        Mood and Affect: Mood normal.        Behavior: Behavior normal.     ED Results / Procedures / Treatments   Labs (all labs ordered are listed, but only abnormal results are displayed) Labs Reviewed  COMPREHENSIVE METABOLIC PANEL WITH GFR - Abnormal; Notable for the following components:      Result Value   Creatinine, Ser 1.30 (*)    Albumin  3.4 (*)    GFR, Estimated 49 (*)    All other components within normal limits  CBC WITH DIFFERENTIAL/PLATELET - Abnormal; Notable for the following components:   RBC 5.54 (*)    MCH 25.1 (*)    RDW 16.1 (*)    All other components within normal limits  PROTIME-INR - Abnormal; Notable for the following components:   Prothrombin Time 23.3 (*)    INR 2.0 (*)    All other components within normal limits    EKG EKG Interpretation Date/Time:  Monday Nov 09 2023 11:01:39 EDT Ventricular Rate:  78 PR Interval:    QRS Duration:  86 QT Interval:  419 QTC Calculation: 478 R Axis:   -81  Text Interpretation: Poor quality data, interpretation may be affected Normal sinus rhythm Left anterior fascicular block Anteroseptal infarct, old Artifact in lead(s) I II III aVR aVL aVF V1 V2 V3 V4 V5 V6 Confirmed by Shyrl Doyne [phone]) on 11/09/2023 1:22:21  PM  Radiology CT Head Wo Contrast Result Date: 11/09/2023 CLINICAL DATA:  53 year old female with headache, blurred vision. Hypertension, LVAD. EXAM: CT HEAD WITHOUT CONTRAST TECHNIQUE: Contiguous axial images were obtained from the base of the skull through the vertex without intravenous contrast. RADIATION DOSE REDUCTION: This exam was performed according to the departmental dose-optimization program  which includes automated exposure control, adjustment of the mA and/or kV according to patient size and/or use of iterative reconstruction technique. COMPARISON:  Head CT 04/02/2021. FINDINGS: Brain: Cerebral volume is stable, within normal limits for age. No midline shift, ventriculomegaly, mass effect, evidence of mass lesion, intracranial hemorrhage or evidence of cortically based acute infarction. Chronic left caudate lacunar infarct. Stable and otherwise normal gray-white differentiation. Vascular: Chronic left MCA vascular stent. Calcified atherosclerosis at the skull base. No suspicious intracranial vascular hyperdensity. Skull: No acute osseous abnormality identified. Sinuses/Orbits: Widespread new left paranasal sinus mucosal thickening and low-density fluid levels. Right side paranasal sinuses, tympanic cavities and mastoids remain clear. Superimposed left nasal cavity mucosal thickening and retained secretions. Other: Visualized orbits and scalp soft tissues are within normal limits. IMPRESSION: 1. Evidence of Acute Rhinosinusitis on the Left, left OMC obstructive pattern. 2. Stable non contrast CT appearance of the brain. Chronic left MCA vascular stent, and chronic left caudate lacunar infarct. Electronically Signed   By: Marlise Simpers M.D.   On: 11/09/2023 13:07    Procedures Procedures    Medications Ordered in ED Medications  acetaminophen  (TYLENOL ) tablet 650 mg (650 mg Oral Given 11/09/23 1256)  oxyCODONE  (Oxy IR/ROXICODONE ) immediate release tablet 5 mg (5 mg Oral Given 11/09/23 1344)    ED  Course/ Medical Decision Making/ A&P                                 Medical Decision Making Amount and/or Complexity of Data Reviewed Labs: ordered. Radiology: ordered.  Risk OTC drugs. Prescription drug management.   This patient presents to the ED for concern of headache, this involves an extensive number of treatment options, and is a complaint that carries with it a high risk of complications and morbidity.   Differential diagnosis includes: Dehydration, electrolyte abnormality, sinusitis, cluster headache, tension headache, migraine, medication side effect, etc.   Comorbidities  See HPI above   Additional History  Additional history obtained from prior records   Cardiac Monitoring / EKG  The patient was maintained on a cardiac monitor.  I personally viewed and interpreted the cardiac monitored which showed: atrial fibrillation, poor quality EKG with a heart rate of 78 bpm.   Lab Tests  I ordered and personally interpreted labs.  The pertinent results include:   BMP and CBC within normal limits for patient   Imaging Studies  I ordered imaging studies including CT head  I independently visualized and interpreted imaging which showed:  Evidence of acute rhinosinusitis on the left, left OMC obstructive pattern. Stable noncontrast CT appearance of the brain.  Chronic left MCA vascular stent, and chronic left caudate lacunar infarct. I agree with the radiologist interpretation   Consultations  I requested consultation with LVAD team,  and discussed lab and imaging findings as well as pertinent plan - they recommend: They agree with patient's treatment plan in the ED, are agreeable with plan to send her home with antibiotics to treat for sinusitis.   Problem List / ED Course / Critical Interventions / Medication Management  Patient presents the ED today after 3-day history of left-sided headache.  Denies any head injury or trauma prior to onset of symptoms.  Was  having a transient episode of right eye blurred vision when the pain first started.  Denies any vision changes, confusion, altered mental status, or weakness.  Is on warfarin.  Has taken Tylenol  for symptoms at home without  improvement.  History of stroke in the past, was advised to come to the ED today for evaluation due to this.  No shortness of breath.  No photophobia, phonophobia, nausea, or vomiting. I ordered medications including: Tylenol  for headache  Reevaluation of the patient after these medicines showed that the patient stayed the same.  I then ordered oxycodone .  On reevaluation this helped relieve some of patient's pain. I have reviewed the patients home medicines and have made adjustments as needed   Social Determinants of Health  Physical activity   Test / Admission - Considered  Discussed signs with patient.  All questions were answered. She is stable and safe for discharge home. Return precautions given.       Final Clinical Impression(s) / ED Diagnoses Final diagnoses:  Acute nonintractable headache, unspecified headache type  Acute non-recurrent maxillary sinusitis    Rx / DC Orders ED Discharge Orders          Ordered    amoxicillin -clavulanate (AUGMENTIN ) 875-125 MG tablet  Every 12 hours        11/09/23 1334    oxyCODONE -acetaminophen  (PERCOCET/ROXICET) 5-325 MG tablet  Every 8 hours PRN        11/09/23 1412              Sonnie Dusky, PA-C 11/09/23 1414    Ninetta Basket, MD 11/17/23 [phone]

## 2023-11-09 NOTE — Progress Notes (Signed)
 LVAD Coordinator ED Encounter  [name] a 53 y.o. female that presented to [facility] ER today due to a severe headache since Saturday. She states she has a brief period where her vision was blurry but it resolved quickly. Headache worsened yesterday and was unrelieved by Tylenol . Pt sensitive to light.    She has a past medical history  has a past medical history of Anemia, Automatic implantable cardioverter-defibrillator in situ, B12 deficiency, Cancer (HCC), Chronic CHF (congestive heart failure) (HCC), Chronic kidney disease, Heart valve problem, History of heart attack, History of hypothyroidism, History of stomach ulcers, HLD (hyperlipidemia), Hyperthyroidism, Hypotension, Hypothyroidism, Morbid obesity (HCC), Myocardial infarction (HCC) (08/2013), Nocturnal dyspnea, Nonischemic cardiomyopathy (HCC), Palpitations, Sinus tachycardia, Sleep apnea, Snoring-prob OSA (09/04/2011), SOB (shortness of breath), [employer] ICD lead RECALL  6949, Stroke (HCC) (2021), 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   Pt taken for STAT head CT:   IMPRESSION: 1. Evidence of Acute Rhinosinusitis on the Left, left OMC obstructive pattern. 2. Stable non contrast CT appearance of the brain. Chronic left MCA vascular stent, and chronic left caudate lacunar infarct.  Discussed with Dr. Mitzie Anda. Augmentin  prescribed for sinusitis. [facility] made aware.   Vital signs: HR:  Doppler MAP: 98/0 Automated BP: UTO O2 Sat: 100% RA  LVAD interrogation reveals:  Speed: 5800 Flow: 5.6 Power:  4.6w PI:2.8  Alarms: none  Drive Line: Existing dressing CDI. Maintained weekly by her husband Carolyn Cisco. Anchor secure.  Significant Events with LVAD:   Updated VAD Providers Dr. Mitzie Anda about the above. No LVAD issues and pump is functioning as expected. Able to independently manage LVAD equipment. No  LVAD needs at this time. Pt ready for discharge.  Laurice Pope RN, BSN VAD Coordinator 24/7 Pager [phone]

## 2023-11-09 NOTE — ED Notes (Signed)
 Patient transported to CT. Pt attached LVAD to her battery pack

## 2023-11-09 NOTE — ED Notes (Signed)
 Pt returned from CT and attached herself back to ER VAD equipment.

## 2023-11-09 NOTE — ED Notes (Signed)
 Tech attempted to collect blood pressure x4 w/ no success. Automatic x2 RA and Manual x2 RA. PA also attempted x1. Pt. Stated, "it's usually a little faint and they always have trouble getting it."

## 2023-11-09 NOTE — Discharge Instructions (Addendum)
 Your labs and imaging are reassuring. Imaging does show signs of a sinus infection, which may be the source of your pain. Take Augmentin  twice a day for the next 7 days. It will take about 2-3 days for the antibiotics to take effect.  Get help right away if: You have a very bad headache. You cannot stop vomiting. You have very bad pain or swelling around your face or eyes. You have trouble seeing. You feel confused. Your neck is stiff. You have trouble breathing.

## 2023-11-09 NOTE — ED Triage Notes (Addendum)
 Pt reports HA and fever since Saturday. Denies any N/V/D or fevers.

## 2023-11-09 NOTE — ED Triage Notes (Signed)
 C/o headache onset sat , not as bad today states she was recently put on blood pressure medication and though that was the problem. LVAD for 2 years , no problems with LVAD had blurred vision right eye on Sat however cleared quickly

## 2023-11-12 ENCOUNTER — Ambulatory Visit (HOSPITAL_COMMUNITY)
Admission: RE | Admit: 2023-11-12 | Discharge: 2023-11-12 | Disposition: A | Discharge status: Home / Self Care | Source: Ambulatory Visit | Attending: Cardiology | Admitting: Cardiology

## 2023-11-12 ENCOUNTER — Ambulatory Visit (HOSPITAL_COMMUNITY): Payer: Self-pay | Admitting: Pharmacist

## 2023-11-12 VITALS — BP 113/58

## 2023-11-12 DIAGNOSIS — Z95811 Presence of heart assist device: Secondary | ICD-10-CM

## 2023-11-12 DIAGNOSIS — Z4509 Encounter for adjustment and management of other cardiac device: Secondary | ICD-10-CM | POA: Insufficient documentation

## 2023-11-12 LAB — POCT INR: INR: 2.4 (ref 2.0–3.0)

## 2023-11-12 NOTE — Progress Notes (Signed)
 Pt presented for BP check in VAD Clinic today alone. Reports no problems with VAD equipment or concerns with drive line.  Pt was in the ER Monday for acute rhinorhitits. Pt was prescribed Augmentin . She is feeling much better since then.  Denies lightheadedness, dizziness, falls or any signs of bleeding today. Pt confirms that she  increased Amlodipine  to 5 mg daily as instructed 5/8. She had labs on Monday in ER that were WNL for this pt.   Vital Signs:  Doppler Pressure: 88 Automatic BP: 113/58 (77) HR: 73 SR  SPO2: 98%   Weight: 265.5 lbs w/o eqt Last weight: 272.4 lb w/o eqt  BP & Labs:  Doppler 88 - Doppler correlated with MAP    Hgb 14.2 - No S/S of bleeding. Specifically denies vaginal bleeding, melena/BRBPR or nosebleeds.    LDH stable at 192 with established baseline of 250 - 450. Denies tea-colored urine. No power elevations noted on interrogation.   Plan:  No change in medications Return to clinic at regularly scheduled visit on 6/9.  Adams Adams RN VAD Coordinator  Office: [phone]  24/7 Pager: [phone]

## 2023-11-18 ENCOUNTER — Ambulatory Visit: Payer: Self-pay | Admitting: Cardiovascular Disease

## 2023-11-19 ENCOUNTER — Ambulatory Visit (HOSPITAL_COMMUNITY): Payer: Self-pay | Admitting: Pharmacist

## 2023-11-19 LAB — POCT INR: INR: 2.1 (ref 2.0–3.0)

## 2023-11-26 ENCOUNTER — Ambulatory Visit (HOSPITAL_COMMUNITY): Payer: Self-pay | Admitting: Pharmacist

## 2023-11-26 ENCOUNTER — Other Ambulatory Visit (HOSPITAL_COMMUNITY): Payer: Self-pay

## 2023-11-26 DIAGNOSIS — Z95811 Presence of heart assist device: Secondary | ICD-10-CM

## 2023-11-26 DIAGNOSIS — Z7901 Long term (current) use of anticoagulants: Secondary | ICD-10-CM

## 2023-11-26 LAB — POCT INR: INR: 2.9 (ref 2.0–3.0)

## 2023-11-27 ENCOUNTER — Other Ambulatory Visit: Payer: Self-pay | Admitting: Nurse Practitioner

## 2023-11-27 ENCOUNTER — Other Ambulatory Visit: Payer: Self-pay

## 2023-11-27 ENCOUNTER — Other Ambulatory Visit (HOSPITAL_COMMUNITY): Payer: Self-pay | Admitting: Cardiology

## 2023-11-27 DIAGNOSIS — I5022 Chronic systolic (congestive) heart failure: Secondary | ICD-10-CM

## 2023-11-27 DIAGNOSIS — E785 Hyperlipidemia, unspecified: Secondary | ICD-10-CM

## 2023-11-27 DIAGNOSIS — Z95811 Presence of heart assist device: Secondary | ICD-10-CM

## 2023-11-30 ENCOUNTER — Other Ambulatory Visit (HOSPITAL_COMMUNITY): Payer: Self-pay | Admitting: *Deleted

## 2023-11-30 ENCOUNTER — Ambulatory Visit (HOSPITAL_COMMUNITY): Payer: Self-pay | Admitting: Cardiology

## 2023-11-30 ENCOUNTER — Other Ambulatory Visit: Payer: Self-pay

## 2023-11-30 ENCOUNTER — Ambulatory Visit (HOSPITAL_COMMUNITY): Payer: Self-pay | Admitting: Pharmacist

## 2023-11-30 ENCOUNTER — Ambulatory Visit (HOSPITAL_COMMUNITY)
Admission: RE | Admit: 2023-11-30 | Discharge: 2023-11-30 | Disposition: A | Discharge status: Home / Self Care | Source: Ambulatory Visit | Attending: Cardiology | Admitting: Cardiology

## 2023-11-30 VITALS — BP 117/80 | HR 80 | Ht 63.0 in | Wt 261.0 lb

## 2023-11-30 DIAGNOSIS — Z7901 Long term (current) use of anticoagulants: Secondary | ICD-10-CM

## 2023-11-30 DIAGNOSIS — D631 Anemia in chronic kidney disease: Secondary | ICD-10-CM | POA: Diagnosis not present

## 2023-11-30 DIAGNOSIS — Z79899 Other long term (current) drug therapy: Secondary | ICD-10-CM | POA: Insufficient documentation

## 2023-11-30 DIAGNOSIS — I493 Ventricular premature depolarization: Secondary | ICD-10-CM | POA: Diagnosis not present

## 2023-11-30 DIAGNOSIS — I340 Nonrheumatic mitral (valve) insufficiency: Secondary | ICD-10-CM | POA: Diagnosis not present

## 2023-11-30 DIAGNOSIS — I4891 Unspecified atrial fibrillation: Secondary | ICD-10-CM | POA: Insufficient documentation

## 2023-11-30 DIAGNOSIS — Z95811 Presence of heart assist device: Secondary | ICD-10-CM

## 2023-11-30 DIAGNOSIS — I472 Ventricular tachycardia, unspecified: Secondary | ICD-10-CM | POA: Insufficient documentation

## 2023-11-30 DIAGNOSIS — I428 Other cardiomyopathies: Secondary | ICD-10-CM

## 2023-11-30 DIAGNOSIS — Z8673 Personal history of transient ischemic attack (TIA), and cerebral infarction without residual deficits: Secondary | ICD-10-CM | POA: Diagnosis not present

## 2023-11-30 DIAGNOSIS — Z4509 Encounter for adjustment and management of other cardiac device: Secondary | ICD-10-CM | POA: Insufficient documentation

## 2023-11-30 DIAGNOSIS — E0590 Thyrotoxicosis, unspecified without thyrotoxic crisis or storm: Secondary | ICD-10-CM | POA: Diagnosis not present

## 2023-11-30 DIAGNOSIS — I5022 Chronic systolic (congestive) heart failure: Secondary | ICD-10-CM

## 2023-11-30 DIAGNOSIS — N183 Chronic kidney disease, stage 3 unspecified: Secondary | ICD-10-CM | POA: Diagnosis not present

## 2023-11-30 DIAGNOSIS — Z9889 Other specified postprocedural states: Secondary | ICD-10-CM | POA: Diagnosis not present

## 2023-11-30 DIAGNOSIS — I130 Hypertensive heart and chronic kidney disease with heart failure and stage 1 through stage 4 chronic kidney disease, or unspecified chronic kidney disease: Secondary | ICD-10-CM | POA: Diagnosis not present

## 2023-11-30 LAB — PROTIME-INR
INR: 2.7 — ABNORMAL HIGH (ref 0.8–1.2)
Prothrombin Time: 29.1 s — ABNORMAL HIGH (ref 11.4–15.2)

## 2023-11-30 LAB — COMPREHENSIVE METABOLIC PANEL WITH GFR
ALT: 26 U/L (ref 0–44)
AST: 24 U/L (ref 15–41)
Albumin: 3.8 g/dL (ref 3.5–5.0)
Alkaline Phosphatase: 57 U/L (ref 38–126)
Anion gap: 8 (ref 5–15)
BUN: 12 mg/dL (ref 6–20)
CO2: 22 mmol/L (ref 22–32)
Calcium: 9.3 mg/dL (ref 8.9–10.3)
Chloride: 103 mmol/L (ref 98–111)
Creatinine, Ser: 1.34 mg/dL — ABNORMAL HIGH (ref 0.44–1.00)
GFR, Estimated: 48 mL/min — ABNORMAL LOW (ref >60)
Glucose, Bld: 80 mg/dL (ref 70–99)
Potassium: 4 mmol/L (ref 3.5–5.1)
Sodium: 133 mmol/L — ABNORMAL LOW (ref 135–145)
Total Bilirubin: 0.6 mg/dL (ref 0.0–1.2)
Total Protein: 7.9 g/dL (ref 6.5–8.1)

## 2023-11-30 LAB — CBC
HCT: 49 % — ABNORMAL HIGH (ref 36.0–46.0)
Hemoglobin: 15.1 g/dL — ABNORMAL HIGH (ref 12.0–15.0)
MCH: 24.9 pg — ABNORMAL LOW (ref 26.0–34.0)
MCHC: 30.8 g/dL (ref 30.0–36.0)
MCV: 80.7 fL (ref 80.0–100.0)
Platelets: 231 10*3/uL (ref 150–400)
RBC: 6.07 MIL/uL — ABNORMAL HIGH (ref 3.87–5.11)
RDW: 17.4 % — ABNORMAL HIGH (ref 11.5–15.5)
WBC: 6.6 10*3/uL (ref 4.0–10.5)
nRBC: 0 % (ref 0.0–0.2)

## 2023-11-30 LAB — LACTATE DEHYDROGENASE: LDH: 177 U/L (ref 98–192)

## 2023-11-30 MED FILL — Spironolactone Tab 25 MG: ORAL | 90 days supply | Qty: 90 | Fill #0 | Status: CN

## 2023-11-30 MED FILL — Rosuvastatin Calcium Tab 40 MG: ORAL | 90 days supply | Qty: 90 | Fill #0 | Status: CN

## 2023-11-30 MED FILL — Dapagliflozin Propanediol Tab 10 MG (Base Equivalent): ORAL | 90 days supply | Qty: 90 | Fill #0 | Status: CN

## 2023-11-30 NOTE — Patient Instructions (Signed)
 No change in medications. Return to VAD Clinic in 2 months - see below. Please call us  if any questions or concerns prior to your next visit.

## 2023-11-30 NOTE — Progress Notes (Addendum)
 Pt presented for 2 month f/u with in VAD Clinic today alone. Reports no problems with VAD equipment or concerns with drive line.  Pt states she has been doing well since last visit. Denies lightheadedness, dizziness, falls or any signs of bleeding.   Pt had to stop Entresto  due to dizziness, she is now on amlodipine . No changes today per Dr. Mitzie Anda.   Vital Signs:  Doppler Pressure: 94 Automatic BP: 117/80 (90) HR: 80 SR  SPO2: 98   Weight: 261 lbs lbs w/o eqt Last weight: 272.4 lb w/o eqt  VAD Indication: Destination Therapy d/t BMI   VAD interrogation & Equipment Management: Speed: 5800 Flow: 5.4 Power: 4.8 w    PI: 4.5   Alarms: none Events: 10-20 daily  Fixed speed: 5800 Low speed limit: 5500   Primary Controller: Replace back up battery in 32 months. Back up controller: Replace back up battery in 33 months.   Annual Equipment Maintenance on UBC/PM was performed on 08/03/23.    I reviewed the LVAD parameters from today and compared the results to the patient's prior recorded data. LVAD interrogation was NEGATIVE for significant power changes, NEGATIVE for clinical alarms and STABLE for PI events/speed drops. No programming changes were made and pump is functioning within specified parameters. Pt is performing daily controller and system monitor self tests along with completing weekly and monthly maintenance for LVAD equipment.   LVAD equipment check completed and is in good working order. Back-up equipment present. Charged back up battery and performed self-test on equipment.   Exit Site Care: CDI. Drive line anchor secure. Pt denies fever or chills. Provided with 8 weekly kits, 4 anchors, and sterile 2 x 2.  Significant Events on VAD Support:  N/A  Device: Medtronic Therapies: on VF 240 VT 200 VVI: 40 Last check: 11/05/23  BP & Labs:  Doppler 94 - Doppler correlated with MAP   Hgb 15.1 - No S/S of bleeding. Specifically denies vaginal bleeding, melena/BRBPR or  nosebleeds.    LDH pending with established baseline of 250 - 450. Denies tea-colored urine. No power elevations noted on interrogation.   Plan:  No change in medications. Return to VAD Clinic in 2 months - see below. Please call us  if any questions or concerns prior to your next visit.   Wynetta Heckle RN,BSN VAD Coordinator  Office: [phone]  24/7 Pager: [phone]      Chief Complaint: LVAD followup, CHF    History of Present Illness  [name] is a 53 y.o. 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  She was followed for systolic CHF initially in [place]. Apparently her EF recovered to 35-40%. However, she was admitted to [facility] 09/19/13 for worsening dyspnea and CP. Coronaries were reportedly OK on LHC at that time at [facility]. Echo showed EF of 15-20% with four-chamber enlargement with moderate-severe TR/MR. RHC as below. Rheumatological w/u negative.  CPX in 9/16 showed near-normal functional capacity.  Repeat echo in 6/17 shows that EF remains 20%. Repeat CPX in 1/18 was submaximal but probably only mild circulatory limitation.  Echo 8/18 showed EF 25-30%, severe LV dilation.  CPX in 6/19 showed low normal functional capacity.  Echo in 10/19 showed EF 20-25%, moderate LV dilation.    Zio patch in 2/20 with 11% PVCs and NSVT, amiodarone  started. Zio patch in 3/20 with 13.3% PVCs and NSVT.  She saw Dr. Rodolfo Clan, he thought  that increased PVCs were electromechanical in nature due to worsening heart failure.  Repeat Zio patch on amiodarone  in 1/21 showed 6.2% PVCs.   She was admitted in 4/20 with VT in setting of hypokalemia.    CPX in 9/20 showed mild functional limitation, more due to body habitus than heart failure.  Echo in 4/21 showed EF 20% with severe LV dilation, diffuse hypokinesis, normal RV size and systolic function.   In 8/21, she had a shock for VF in the setting  of hypokalemia.   Patient was admitted in 10/21 with CVA involving left MCA territory.  This was thought to be cardioembolic but LV thrombus was not visualized and no atrial fibrillation has been seen.  Echo in 10/21 showed EF 20-25%, severe LV dilation (no LV thrombus seen), mildly decreased RV systolic function, mild-moderate MR. She was taken by IR for thrombectomy and stent placement, now on ASA 81 and ticagrelor .   She had recurrent VT in 2/22, quinidine was added by EP.  Spironolactone  and Coreg  were decreased due to low BP and lightheadedness.    Echo in 4/22 showed EF 25% with severe LV dilation and global hypokinesis, normal RV, mild-moderate MR.   CPX (8/22) showed peak VO2 14.3, VE/VCO2 slope 42, RER 1.09 => mild to moderate HF limitation, elevated VE/VCO2 slope is likely false from end exercise hyperventilation. Restrictive lung physiology from obesity.   Patient was admitted in 3/23 with recurrent CHF exacerbation with low cardiac output.  She was also noted to be hyperthyroidism but amiodarone  had to be continued due to VT and poorly tolerated atrial fibrillation.  She required Impella 5.5 placement and eventual HM3 LVAD placement on 09/04/21.  Post-op course complicated by atrial fibrillation, malnutrition, and deconditioning.  She went to CIR prior to discharge.   Patient developed heavy vaginal bleeding in 7/23, she was seen in the ER and required 2 units PRBCs.  She was admitted in 8/23 and had hysterectomy + BSO, LVAD speed increased to 5900 rpm on ramp echo.  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    The patient was admitted overnight in 12/23 with severe posterior epistaxis requiring nasal pacing.   In 2/24, she had VF with shock.  Amiodarone  was restarted.   Echo in 7/24 showed  EF < 20, mild RV dysfunction with normal size, interventricular septum shifted mildly to right.   At a previous visit in 12/2022, her VAD speed was increased to  6000. She took a torsemide  a day before presenting to clinic in 02/2023 and noted an increased frequency of PI events despite drinking water . Her speed was reduced to 5900 rpm. She continued to have suction events and speed was decreased to 5700 rpm in 11/24.   Ramp echo was done in 12/24, septum bowed to right at 5700 rpm, so speed increased to 5800 rpm.  The aortic valve opened each beat at 5800 rpm with moderate RV dysfunction/normal RV size.   She returns for followup of LVAD.  At last appointment, weight was up and she was volume overloaded and hypertensive.  Torsemide  was increased and she was started on Entresto .  She has subsequently stopped Entresto  due to lightheadedness and low flow episodes, now on amlodipine .  She has lost 11 lbs and is now back to taking torsemide  prn.  She is watching her sodium intake better.  No significant exertional dyspnea.  Exercising regularly.  No further lightheaded spells.  MAP 94 today.  LVAD interrogation: Personally reviewed.  Please see LVAD nursing note above.    Labs (4/25): TSH 4.91, mildly elevated Labs (5/25): hgb 13.4, creatinine 1.3  PMH: 1. OSA: Using CPAP.  2. PVCs:  - Zio patch 2/20: 11% PVCs - Zio patch 3/20: 13.3% PVCs - Zio patch 1/21: 6.2% PVCs 3. VT/VF: 8/21, in setting of hypokalemia.  2/22 VT, quinidine added.  - VT 2/24, restarted amiodarone  4. 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  No LV thrombus visualized and atrial fibrillation has not been visualized.  - IR thrombectomy with stent placement in 10/21.  5. Chronic systolic CHF: Nonischemic cardiomyopathy:   - Coronary angiography 2015 at [facility]: Normal coronaries.  - Echo (8/15) with EF 20%, moderate LV dilation, diffuse hypokinesis, normal RV size with mildly decreased systolic function, mild MR.  - Echo (5/16) with EF 20%, spherical LV with diffuse hypokinesis, mild MR.  - Echo (6/17) with EF 20%, diffuse hypokinesis, moderate LV dilation, normal RV, mild to moderate MR.  - RHC  (12/17): mean RA 5, PA 56/31, mean PCWP 24, CI 2, PVR 3.7 WU - Echo (8/18) with EF 25-30%, severe LV dilation, severe LAE.  - Echo (2/20) with EF 20-25%, moderate LV dilation, moderate MR.  - Echo (4/21) with EF 20-25%, severe LV dilation, mild-moderate MR, normal RV, PASP 26 mmHg.  - Echo (10/21) with EF 20-25%, severe LV dilation (no LV thrombus seen), mildly decreased RV systolic function, mild-moderate MR. - CPX in 9/20 showed mild functional limitation, more due to body habitus than heart failure.  - CPX in 8/21: Peak VO2 11.6, VE/VCO2 32, RER 1.2.  Mild HF limitation.  - Echo (4/22): EF 25% with severe LV dilation and global hypokinesis, normal RV, mild-moderate MR.  - CPX (8/22): peak VO2 14.3, VE/VCO2 slope 42, RER 1.09 => mild to moderate HF limitation, elevated VE/VCO2 slope is likely false from end exercise hyperventilation. Restrictive lung physiology from obesity.  - HM3 LVAD placement 09/04/21.  - Echo (7/24): EF < 20, mild RV dysfunction with normal size, interventricular septum shifted mildly to right. - Ramp echo (12/24): Speed increased to 5800 rpm.  Aortic valve opens each beat, moderate RV dysfunction with normal RV size.  6. PFTs (1/22): Near normal.  7. Fe deficiency anemia 8. 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  10. Left ovarian mass -  hysterectomy + BSO.  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      Current Outpatient Medications  Medication Sig Dispense Refill   ALPRAZolam  (XANAX ) 0.25 MG tablet Take 1 tablet (0.25 mg total) by mouth 2 (two) times daily as needed for anxiety. 20 tablet 2   amiodarone  (PACERONE ) 200 MG tablet Take 1 tablet (200 mg total) by mouth daily. 90 tablet 3   amLODipine  (NORVASC ) 5 MG tablet Take 1 tablet (5 mg total) by mouth daily. 180 tablet 3   ASPIRIN  LOW DOSE 81 MG tablet TAKE ONE TABLET BY MOUTH ONCE A DAY 30 tablet 11   Cholecalciferol (VITAMIN D3)  125 MCG (5000 UT) CAPS Take 5,000 Units by mouth.     dapagliflozin  propanediol (FARXIGA ) 10 MG TABS tablet Take 1 tablet (10 mg total) by mouth daily before breakfast. 90 tablet 3   estradiol  (CLIMARA  - DOSED IN MG/24 HR) 0.0375 mg/24hr patch Place 1 patch (0.0375 mg total) onto the skin every 7 (seven) days. 4 patch 11   potassium chloride  SA (KLOR-CON  M) 20 MEQ tablet Take  1 tablet (20 mEq total) by mouth daily. 270 tablet 3   quiniDINE gluconate  324 MG CR tablet Take 1 tablet (324 mg total) by mouth 2 (two) times daily. 60 tablet 6   rosuvastatin  (CRESTOR ) 40 MG tablet Take 1 tablet (40 mg total) by mouth daily. 90 tablet 3   sildenafil  (REVATIO ) 20 MG tablet Take 1 tablet (20 mg total) by mouth 3 (three) times daily. 90 tablet 6   spironolactone  (ALDACTONE ) 25 MG tablet Take 1 tablet (25 mg total) by mouth daily. 90 tablet 3   torsemide  (DEMADEX ) 20 MG tablet Take 1 tablet (20 mg total) by mouth every other day. Take 1 tablet (20 mg) by mouth as needed, or as directed by heart failure clinic. 180 tablet 3   warfarin (COUMADIN ) 2 MG tablet Take 4 mg (2 tabs) daily or as directed by LVAD Clinic. (Patient taking differently: Take 2 mg by mouth daily at 4 PM.  2 mg (2 mg x 1) every Tue, Thu, Sat; 4 mg (2 mg x 2) all other days or as directed by LVAD Clinic.) 180 tablet 3   No current facility-administered medications for this encounter.    Allergies:   Patient has no known allergies.   Social History:  The patient  reports that she has never smoked. She has never used smokeless tobacco. She reports that she does not drink alcohol and does not use drugs.   Family History:  The patient's family history includes Heart Problems in her mother; Heart disease in her maternal grandmother and mother; High blood pressure in her father; Hypertension in her father and mother; Obesity in her mother; Stroke in her father.   ROS:  Please see the history of present illness.   All other systems are personally  reviewed and negative.   Exam:    MAP 94 General: Well appearing this am. NAD. Obese.  HEENT: Normal. Neck: Supple, JVP 7-8 cm. Carotids OK.  Cardiac:  Mechanical heart sounds with LVAD hum present.  Lungs:  CTAB, normal effort.  Abdomen:  NT, ND, no HSM. No bruits or masses. +BS  LVAD exit site: Well-healed and incorporated. Dressing dry and intact. No erythema or drainage. Stabilization device present and accurately applied. Driveline dressing changed daily per sterile technique. Extremities:  Warm and dry. No cyanosis, clubbing, rash, or edema.  Neuro:  Alert & oriented x 3. Cranial nerves grossly intact. Moves all 4 extremities w/o difficulty. Affect pleasant    Recent Labs: 03/17/2023: Magnesium  1.8 10/01/2023: TSH 4.91 11/30/2023: ALT 26; BUN 12; Creatinine, Ser 1.34; Hemoglobin 15.1; Platelets 231; Potassium 4.0; Sodium 133  Personally reviewed   Wt Readings from Last 3 Encounters:  11/30/23 118.4 kg (261 lb)  11/09/23 120.2 kg (265 lb)  10/21/23 116.1 kg (256 lb)    ASSESSMENT AND PLAN:   1. Chronic systolic HF: Nonischemic cardiomyopathy.  She has a Medtronic ICD. Cause for cardiomyopathy is uncertain, initially identified peri-partum (after son's birth), so cannot rule out peri-partum CMP.  On and off, she has had frequent PVCs.   RHC in 8/15 showed normal filling pressures and preserved cardiac output. Echo in 4/22 with EF 25%, severe LV dilation.  CPX (8/22) with mild-moderate HF limitation.  Echo in 3/23 with EF < 20% with severe dilation, normal RV size with mildly decreased systolic function, moderate-severe functional MR with dilated IVC. She was admitted in 3/23 with low output HF, RHC on 0.25 of Milrinone  showed elevated filling pressures, moderate mixed pulmonary venous/pulmonary arterial  hypertension and low output. Impella 5.5 placed 08/29/21.  Patient had HM3 LVAD placed 09/04/21 and speed adjusted by ramp echo up to 5800 rpm and again up to 5900 rpm. In 7/24, LVAD speed  increased to 6000 rpm under echo guidance. Developed PI and suction events, speed decreased to 5700 rpm.  In 12/24, ramp echo done and speed increased to 5800 rpm; the aortic valve opened each beat and the septum was midline.  NYHA class II, weight down and not volume overloaded on exam.     - She can continue to take torsemide  20 mg prn.  Continue to control sodium and fluid intake.  MAP acceptable.  - Continue Farxiga  10 mg daily.  - Continue spironolactone  25 mg daily.  - Stopped Entresto  due to lightheadedness and low flow events.  - Continue amlodipine  5 mg daily for BP control.  - She should continue ASA 81 mg daily with history of CVA.  - Continue sildenafil  20 mg tid  - Warfarin for VAD, goal INR 2-2.5.   2. Hyperthyroidism: Has history of hypothroidism thought to be associated with amiodarone .  She had been on Levoxyl  at home prior to 3/23 admission, noted to be hyperthyroid in 3/23.  This likely contributes to her CHF. She had recurrent VT and poorly tolerated AF which made it difficult for us  to stop amiodarone . Initially treated w/ IV Solumedrol -> transitioned to prednisone  and methimazole .  Endocrinology following, prednisone  has been titrated off, now titrated off methimazole .  We had to restart her on amiodarone  with VF.  - Continue amiodarone  200 daily, check LFTs and TSH today and will need regular eye exam.  3. PVCs/VT: EP consulted given complicated situation with hyperthyroidism and VT. She had a VF episode and amiodarone  was restarted.  - Continue quinidine.  - Continue amiodarone , check LFTs and TSH as above.  4. CVA: CVA in 10/21, had thrombectomy with stent placement.  No LV thrombus noted and no atrial fibrillation noted.  - Would continue ASA 81 daily.   5. Anemia: CBC today.  6. CKD stage 3: BMET today.  7. Mitral regurgitation: Moderate-severe functional MR pre-LVAD.  Unlikely to be Mitraclip candidate with severely dilated LV (>7 cm). Mitral regurgitation was mild on  7/24 echo and 12/24 echo.  8. Left ovarian mass: Hysterectomy + BSO.  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   9. Atrial fibrillation: Tolerates poorly. Has been in NSR.   Followup in 2 months.   I spent 41 minutes reviewing records and LVAD interrogation, examining/interviewing patient, and organizing orders.    Peder Bourdon, MD  11/30/2023  [facility] [address] [facility] [place] [address] [phone] (office) [phone] (fax)

## 2023-12-01 ENCOUNTER — Other Ambulatory Visit: Payer: Self-pay

## 2023-12-01 MED FILL — Spironolactone Tab 25 MG: ORAL | 90 days supply | Qty: 90 | Fill #0 | Status: AC

## 2023-12-01 MED FILL — Dapagliflozin Propanediol Tab 10 MG (Base Equivalent): ORAL | 90 days supply | Qty: 90 | Fill #0 | Status: CN

## 2023-12-02 ENCOUNTER — Other Ambulatory Visit: Payer: Self-pay

## 2023-12-07 ENCOUNTER — Encounter (INDEPENDENT_AMBULATORY_CARE_PROVIDER_SITE_OTHER): Payer: Self-pay | Admitting: Family Medicine

## 2023-12-07 ENCOUNTER — Ambulatory Visit (INDEPENDENT_AMBULATORY_CARE_PROVIDER_SITE_OTHER): Admitting: Family Medicine

## 2023-12-07 VITALS — HR 65 | Temp 98.0°F | Ht 63.0 in | Wt 255.0 lb

## 2023-12-07 DIAGNOSIS — Z7901 Long term (current) use of anticoagulants: Secondary | ICD-10-CM

## 2023-12-07 DIAGNOSIS — Z6841 Body Mass Index (BMI) 40.0 and over, adult: Secondary | ICD-10-CM

## 2023-12-07 NOTE — Progress Notes (Signed)
 SUBJECTIVE:  Chief Complaint: Obesity  Interim History: Patient had COVID last month for the first time.  She felt she did alright but then found to have a very severe sinus infection leading to significant headache.  Since last appointment she was trying to stay consistent with food plan.  She is getting in 1200 calories daily and voices this had been difficult as she was eating more.  She voices that she is eating fresh fruit and vegetables that she is growing in her own garden now.  She mentions she is grilling enough meat for the week on the weekends.  She has been measuring her protein the way she is supposed to.  She is eating sandwiches at lunch and is eating an egg or two at breakfast with fairlife milk. No medication changes at this time.  Daughter's 30th birthday coming up.   [name] is here to discuss her progress with her obesity treatment plan. She is on the Category 3 Plan and states she is following her eating plan approximately 85 % of the time. She states she is walking 30 minutes 4 times per week.   OBJECTIVE: Visit Diagnoses: Problem List Items Addressed This Visit       Other   Morbid obesity (HCC)   Chronic anticoagulation - Primary   Patient on long term anticoagulation.  With recent covid infection and subsequent sinusitis and antibiotic use patient INR fluctuated and has been closely monitored.  Patient will continue to follow up with coumadin  clinic for further management.      Other Visit Diagnoses       BMI 45.0-49.9, adult (HCC)           Vitals Temp: 98 F (36.7 C) Pulse Rate: 65 SpO2: 99 %   Anthropometric Measurements Height: 5' 3 (1.6 m) Weight: 255 lb (115.7 kg) BMI (Calculated): 45.18 Weight at Last Visit: 258 lb Weight Lost Since Last Visit: 3 Weight Gained Since Last Visit: 0 Starting Weight: 243 lb Total Weight Loss (lbs): 0 lb (0 kg)   Body Composition  Body Fat %: 47.4 % Fat Mass (lbs): 121 lbs Muscle Mass (lbs): 127.6  lbs Total Body Water  (lbs): 85.4 lbs Visceral Fat Rating : 16   Other Clinical Data Fasting: yes Labs: no Today's Visit #: 19 Starting Date: 04/09/22 Comments: Cat 3     ASSESSMENT AND PLAN:  Diet: [name] is currently in the action stage of change. As such, her goal is to continue with weight loss efforts and has agreed to the Category 3 Plan.   Exercise:  For substantial health benefits, adults should do at least 150 minutes (2 hours and 30 minutes) a week of moderate-intensity, or 75 minutes (1 hour and 15 minutes) a week of vigorous-intensity aerobic physical activity, or an equivalent combination of moderate- and vigorous-intensity aerobic activity. Aerobic activity should be performed in episodes of at least 10 minutes, and preferably, it should be spread throughout the week.  Behavior Modification:  We discussed the following Behavioral Modification Strategies today: increasing lean protein intake, decreasing simple carbohydrates, increasing vegetables, planning for success, and keep a strict food journal.   Return in about 4 weeks (around 01/04/2024).   She was informed of the importance of frequent follow up visits to maximize her success with intensive lifestyle modifications for her multiple health conditions.  Attestation Statements:   Reviewed by clinician on day of visit: allergies, medications, problem list, medical history, surgical history, family history, social history, and previous encounter notes.  Donaciano Frizzle, MD

## 2023-12-07 NOTE — Assessment & Plan Note (Signed)
 Patient on long term anticoagulation.  With recent covid infection and subsequent sinusitis and antibiotic use patient INR fluctuated and has been closely monitored.  Patient will continue to follow up with coumadin  clinic for further management.

## 2023-12-10 ENCOUNTER — Ambulatory Visit (HOSPITAL_COMMUNITY): Payer: Self-pay | Admitting: Pharmacist

## 2023-12-10 LAB — POCT INR: INR: 3.2 — AB (ref 2.0–3.0)

## 2023-12-14 NOTE — Progress Notes (Signed)
 Remote ICD transmission.

## 2023-12-17 ENCOUNTER — Ambulatory Visit (HOSPITAL_COMMUNITY): Payer: Self-pay

## 2023-12-17 DIAGNOSIS — Z7901 Long term (current) use of anticoagulants: Secondary | ICD-10-CM | POA: Diagnosis not present

## 2023-12-17 LAB — POCT INR: INR: 2.3 (ref 2.0–3.0)

## 2023-12-17 MED ORDER — WARFARIN SODIUM 2 MG PO TABS: ORAL_TABLET | ORAL | Status: DC

## 2023-12-17 NOTE — Progress Notes (Signed)
 LVAD INR

## 2023-12-18 ENCOUNTER — Encounter (HOSPITAL_COMMUNITY): Payer: Self-pay | Admitting: Interventional Radiology

## 2023-12-24 ENCOUNTER — Ambulatory Visit (HOSPITAL_COMMUNITY): Payer: Self-pay | Admitting: Pharmacist

## 2023-12-24 LAB — POCT INR: INR: 2.3 (ref 2.0–3.0)

## 2023-12-31 ENCOUNTER — Ambulatory Visit (HOSPITAL_COMMUNITY): Payer: Self-pay | Admitting: Pharmacist

## 2023-12-31 LAB — POCT INR: INR: 2.4 (ref 2.0–3.0)

## 2024-01-05 ENCOUNTER — Ambulatory Visit (INDEPENDENT_AMBULATORY_CARE_PROVIDER_SITE_OTHER): Payer: HMO | Admitting: Nurse Practitioner

## 2024-01-05 VITALS — HR 50 | Temp 98.1°F | Ht 63.0 in | Wt 261.2 lb

## 2024-01-05 DIAGNOSIS — G4733 Obstructive sleep apnea (adult) (pediatric): Secondary | ICD-10-CM | POA: Diagnosis not present

## 2024-01-05 DIAGNOSIS — Z95811 Presence of heart assist device: Secondary | ICD-10-CM | POA: Diagnosis not present

## 2024-01-05 DIAGNOSIS — I5022 Chronic systolic (congestive) heart failure: Secondary | ICD-10-CM | POA: Diagnosis not present

## 2024-01-05 DIAGNOSIS — F410 Panic disorder [episodic paroxysmal anxiety] without agoraphobia: Secondary | ICD-10-CM | POA: Diagnosis not present

## 2024-01-05 DIAGNOSIS — I428 Other cardiomyopathies: Secondary | ICD-10-CM

## 2024-01-05 DIAGNOSIS — E785 Hyperlipidemia, unspecified: Secondary | ICD-10-CM | POA: Diagnosis not present

## 2024-01-05 DIAGNOSIS — Z6841 Body Mass Index (BMI) 40.0 and over, adult: Secondary | ICD-10-CM | POA: Diagnosis not present

## 2024-01-05 DIAGNOSIS — E0590 Thyrotoxicosis, unspecified without thyrotoxic crisis or storm: Secondary | ICD-10-CM | POA: Diagnosis not present

## 2024-01-05 MED ORDER — TIRZEPATIDE-WEIGHT MANAGEMENT 5 MG/0.5ML ~~LOC~~ SOLN: 5.0000 mg | SUBCUTANEOUS | 1 refills | Status: DC

## 2024-01-05 MED ORDER — TIRZEPATIDE 2.5 MG/0.5ML ~~LOC~~ SOAJ: 2.5000 mg | SUBCUTANEOUS | Status: DC

## 2024-01-05 NOTE — Progress Notes (Signed)
 Miranda Glance, NP-C Phone: [phone]  [name] is a 53 y.o. female who presents today for follow up.   Discussed the use of AI scribe software for clinical note transcription with the patient, who gave verbal consent to proceed.  History of Present Illness   [name] is a 53 year old female with obesity and cardiovascular issues who presents for weight management and medication review.  She has been struggling with weight management. Previously, she was on Wegovy , which helped her lose about ten pounds, but her insurance no longer covers it. Attempts to switch to Zepbound  were also unsuccessful due to insurance issues. She is currently focusing on diet and exercise but is not seeing significant results. She aims to reduce her weight to 190-160 pounds. She follows a 1200 calorie diet but sometimes does not feel like she is getting enough food.  She is on multiple medications for her cardiovascular condition, including Crestor , amiodarone , Farxiga , quinidine, sildenafil , spironolactone , torsemide , and warfarin. She regularly visits the Coumadin  clinic and cardiology for her LVAD management. She also sees endocrinology for her thyroid , which is improving, and hematology/oncology for cancer follow-up. 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  She experiences anxiety, particularly in crowded situations, and uses Xanax  as needed. She notes increased anxiety since receiving her LVAD.  She reports that her breathing is different since she gained weight and that she wants to lose weight.  She reports having a severe cold and a sinus infection, which she suspects might have been COVID-19. She experienced significant pressure and tenderness in her sinuses but is feeling better now.      Social History   Tobacco Use  Smoking Status Never  Smokeless Tobacco Never    Current Outpatient Medications on File Prior to Visit  Medication Sig Dispense Refill   ALPRAZolam  (XANAX ) 0.25 MG  tablet Take 1 tablet (0.25 mg total) by mouth 2 (two) times daily as needed for anxiety. 20 tablet 2   amLODipine  (NORVASC ) 5 MG tablet Take 1 tablet (5 mg total) by mouth daily. 180 tablet 3   ASPIRIN  LOW DOSE 81 MG tablet TAKE ONE TABLET BY MOUTH ONCE A DAY 30 tablet 11   Cholecalciferol (VITAMIN D3) 125 MCG (5000 UT) CAPS Take 5,000 Units by mouth.     dapagliflozin  propanediol (FARXIGA ) 10 MG TABS tablet Take 1 tablet (10 mg total) by mouth daily before breakfast. 90 tablet 3   estradiol  (CLIMARA  - DOSED IN MG/24 HR) 0.0375 mg/24hr patch Place 1 patch (0.0375 mg total) onto the skin every 7 (seven) days. 4 patch 11   potassium chloride  SA (KLOR-CON  M) 20 MEQ tablet Take 1 tablet (20 mEq total) by mouth daily. 270 tablet 3   quiniDINE gluconate  324 MG CR tablet Take 1 tablet (324 mg total) by mouth 2 (two) times daily. 60 tablet 6   rosuvastatin  (CRESTOR ) 40 MG tablet Take 1 tablet (40 mg total) by mouth daily. 90 tablet 3   spironolactone  (ALDACTONE ) 25 MG tablet Take 1 tablet (25 mg total) by mouth daily. 90 tablet 3   warfarin (COUMADIN ) 2 MG tablet Take 1 tablet (2 mg) every Tue, Thu, Sat and 2 tablets (4 mg) all other days or as directed by LVAD Clinic.     No current facility-administered medications on file prior to visit.     ROS see history of present illness  Objective  Physical Exam Vitals:   01/05/24 0934  Pulse: (!) 50  Temp: 98.1 F (36.7  C)  SpO2: 99%    BP Readings from Last 3 Encounters:  11/30/23 117/80  11/12/23 (!) 113/58  10/12/23 (!) 120/0   Wt Readings from Last 3 Encounters:  01/05/24 261 lb 3.2 oz (118.5 kg)  12/07/23 255 lb (115.7 kg)  11/30/23 261 lb (118.4 kg)    Physical Exam Constitutional:      General: She is not in acute distress.    Appearance: Normal appearance.  HENT:     Head: Normocephalic.  Cardiovascular:     Comments: Mechanical heart sounds with LVAD present.  Pulmonary:     Effort: Pulmonary effort is normal. No  respiratory distress.     Breath sounds: Normal breath sounds.  Skin:    General: Skin is warm and dry.  Neurological:     Mental Status: She is alert.  Psychiatric:        Mood and Affect: Mood normal.        Behavior: Behavior normal.     Assessment/Plan: Please see individual problem list.  SYSTOLIC HEART FAILURE, CHRONIC Assessment & Plan: Closely followed by Cardiology. Has an LVAD. Needs cardiac transplant. Working on BMI reduction in order to have transplant. Encouraged to continue working on healthy diet and exercise. She will continue her current medication regimen. Follow up with Cardiology as scheduled.   Orders: -     Tirzepatide ; Inject 2.5 mg into the skin once a week. -     Tirzepatide -Weight Management; Inject 5 mg into the skin once a week.  Dispense: 2 mL; Refill: 1  NICM (nonischemic cardiomyopathy) (HCC) -     Tirzepatide ; Inject 2.5 mg into the skin once a week. -     Tirzepatide -Weight Management; Inject 5 mg into the skin once a week.  Dispense: 2 mL; Refill: 1  OSA (obstructive sleep apnea) -     Tirzepatide ; Inject 2.5 mg into the skin once a week. -     Tirzepatide -Weight Management; Inject 5 mg into the skin once a week.  Dispense: 2 mL; Refill: 1  LVAD (left ventricular assist device) present (HCC) -     Tirzepatide ; Inject 2.5 mg into the skin once a week. -     Tirzepatide -Weight Management; Inject 5 mg into the skin once a week.  Dispense: 2 mL; Refill: 1  Morbid obesity with BMI of 45.0-49.9, adult [facility]) Assessment & Plan: She struggles with weight loss despite diet and exercise. Considering self-pay for Mounjaro  due to insurance issues with Wegovy . Recognizes the need for weight loss for cardiac health. Provide a sample of Mounjaro  for one month and send a prescription to [facility] for the self-pay option. Discuss [facility] as an alternative weight loss program. Plan follow-up in two months to assess progress. Continue follow up with [facility].  Orders: -     Tirzepatide ; Inject 2.5 mg into the skin once a week. -     Tirzepatide -Weight Management; Inject 5 mg into the skin once a week.  Dispense: 2 mL; Refill: 1  Panic attacks Assessment & Plan: Occasional use of Xanax  0.25mg  BID for situational anxiety. Continue current management.    Hyperthyroidism Assessment & Plan: Thyroid  function is monitored by endocrinology and is improving. Continuous monitoring is necessary due to amiodarone 's impact.  Encouraged to follow up with Endocrinology as scheduled.     Hyperlipidemia, unspecified hyperlipidemia type Assessment & Plan: Cholesterol is managed with Crestor . Continue.       Return in about 2 months (around 03/07/2024) for  Follow up.   Miranda Glance, NP-C [facility]

## 2024-01-07 ENCOUNTER — Ambulatory Visit (HOSPITAL_COMMUNITY): Payer: Self-pay | Admitting: Pharmacist

## 2024-01-07 ENCOUNTER — Other Ambulatory Visit (HOSPITAL_COMMUNITY): Payer: Self-pay | Admitting: Cardiology

## 2024-01-07 DIAGNOSIS — Z95811 Presence of heart assist device: Secondary | ICD-10-CM

## 2024-01-07 DIAGNOSIS — I5022 Chronic systolic (congestive) heart failure: Secondary | ICD-10-CM

## 2024-01-07 LAB — POCT INR: INR: 2.8 (ref 2.0–3.0)

## 2024-01-07 MED FILL — Spironolactone Tab 25 MG: ORAL | 90 days supply | Qty: 90 | Fill #1 | Status: CN

## 2024-01-07 MED FILL — Quinidine Gluconate Tab ER 324 MG: ORAL | 30 days supply | Qty: 60 | Fill #1 | Status: AC

## 2024-01-07 MED FILL — Rosuvastatin Calcium Tab 40 MG: ORAL | 90 days supply | Qty: 90 | Fill #0 | Status: AC

## 2024-01-07 MED FILL — Dapagliflozin Propanediol Tab 10 MG (Base Equivalent): ORAL | 90 days supply | Qty: 90 | Fill #0 | Status: AC

## 2024-01-08 ENCOUNTER — Other Ambulatory Visit: Payer: Self-pay

## 2024-01-08 MED ORDER — AMIODARONE HCL 200 MG PO TABS
200.0000 mg | ORAL_TABLET | Freq: Every day | ORAL | 3 refills | Status: AC
  Filled 2024-01-08 – 2024-02-29 (×3): qty 90, 90d supply, fill #0
  Filled 2024-04-09 – 2024-05-09 (×2): qty 90, 90d supply, fill #1
  Filled 2024-07-06: qty 90, 90d supply, fill #2

## 2024-01-08 MED ORDER — TORSEMIDE 20 MG PO TABS
20.0000 mg | ORAL_TABLET | ORAL | 3 refills | Status: AC
Start: 2024-01-08 — End: ?
  Filled 2024-01-08: qty 15, 30d supply, fill #0

## 2024-01-08 MED ORDER — SILDENAFIL CITRATE 20 MG PO TABS
20.0000 mg | ORAL_TABLET | Freq: Three times a day (TID) | ORAL | 6 refills | Status: AC
  Filled 2024-01-08: qty 90, 30d supply, fill #0
  Filled 2024-02-29: qty 90, 30d supply, fill #1
  Filled 2024-05-09: qty 90, 30d supply, fill #2
  Filled 2024-07-06: qty 90, 30d supply, fill #3

## 2024-01-11 ENCOUNTER — Other Ambulatory Visit: Payer: Self-pay

## 2024-01-11 MED FILL — Spironolactone Tab 25 MG: ORAL | 90 days supply | Qty: 90 | Fill #1 | Status: CN

## 2024-01-14 ENCOUNTER — Encounter: Payer: Self-pay | Admitting: Nurse Practitioner

## 2024-01-14 ENCOUNTER — Ambulatory Visit (HOSPITAL_COMMUNITY): Payer: Self-pay | Admitting: Pharmacist

## 2024-01-14 LAB — POCT INR: INR: 1.9 — AB (ref 2.0–3.0)

## 2024-01-14 NOTE — Assessment & Plan Note (Signed)
 Occasional use of Xanax 0.25mg  BID for situational anxiety. Continue current management.

## 2024-01-14 NOTE — Assessment & Plan Note (Signed)
 She struggles with weight loss despite diet and exercise. Considering self-pay for Mounjaro  due to insurance issues with Wegovy . Recognizes the need for weight loss for cardiac health. Provide a sample of Mounjaro  for one month and send a prescription to [facility] for the self-pay option. Discuss [facility] as an alternative weight loss program. Plan follow-up in two months to assess progress. Continue follow up with [facility].

## 2024-01-14 NOTE — Assessment & Plan Note (Signed)
Closely followed by Cardiology. Has an LVAD. Needs cardiac transplant. Working on BMI reduction in order to have transplant. Encouraged to continue working on healthy diet and exercise. She will continue her current medication regimen. Follow up with Cardiology as scheduled.

## 2024-01-14 NOTE — Assessment & Plan Note (Signed)
 Thyroid  function is monitored by endocrinology and is improving. Continuous monitoring is necessary due to amiodarone 's impact.  Encouraged to follow up with Endocrinology as scheduled.

## 2024-01-14 NOTE — Assessment & Plan Note (Signed)
 Cholesterol is managed with Crestor . Continue.

## 2024-01-18 ENCOUNTER — Ambulatory Visit (INDEPENDENT_AMBULATORY_CARE_PROVIDER_SITE_OTHER): Admitting: Family Medicine

## 2024-01-18 ENCOUNTER — Encounter (INDEPENDENT_AMBULATORY_CARE_PROVIDER_SITE_OTHER): Payer: Self-pay | Admitting: Family Medicine

## 2024-01-18 VITALS — HR 68 | Temp 97.9°F | Ht 63.0 in | Wt 258.0 lb

## 2024-01-18 DIAGNOSIS — Z6841 Body Mass Index (BMI) 40.0 and over, adult: Secondary | ICD-10-CM | POA: Diagnosis not present

## 2024-01-18 DIAGNOSIS — G4733 Obstructive sleep apnea (adult) (pediatric): Secondary | ICD-10-CM

## 2024-01-18 NOTE — Progress Notes (Unsigned)
   SUBJECTIVE:  Chief Complaint: Obesity  Interim History: Patient recently restarted Mounjaro .  She is going to try to afford the out of pocket expense.  She is on the 2.5mg  dose currently.  At home her scale read 253 when she first woke up.   Food wise she hasn't been very hungry.  She is also food logging to ensure she isn't getting over 1200 calories.  Yesterday she was at 1100 calories and Saturday she was 1176.  She has been consistently staying around 1200.  Protein has been 75, 95, 91, 95, 85, 83.  [name] is here to discuss her progress with her obesity treatment plan. She is on the Category 2 Plan and states she is following her eating plan approximately 90 % of the time. She states she is exercising 30 minutes 7 times per week- she is doing Just Dance with her granddaughter.   OBJECTIVE: Visit Diagnoses: Problem List Items Addressed This Visit   None   Vitals Temp: 97.9 F (36.6 C) BP: -- (LVAD) Pulse Rate: 68 SpO2: 100 %   Anthropometric Measurements Height: 5' 3 (1.6 m) Weight: 258 lb (117 kg) BMI (Calculated): 45.71 Weight at Last Visit: 255 lb Weight Lost Since Last Visit: 0 Weight Gained Since Last Visit: 3 Starting Weight: 243 lb Total Weight Loss (lbs): 0 lb (0 kg)   Body Composition  Body Fat %: 48.6 % Fat Mass (lbs): 125.4 lbs Muscle Mass (lbs): 126 lbs Total Body Water  (lbs): 88.2 lbs Visceral Fat Rating : 16   Other Clinical Data Fasting: no Labs: no Today's Visit #: 20 Starting Date: 04/09/22 Comments: Cat 2     ASSESSMENT AND PLAN:  Diet: [name] is currently in the action stage of change. As such, her goal is to continue with weight loss efforts and has agreed to the Category 2 Plan and keeping a food journal and adhering to recommended goals of 1100-1200 calories and 90 or more grams protein.   Exercise:  For substantial health benefits, adults should do at least 150 minutes (2 hours and 30 minutes) a week of moderate-intensity, or 75  minutes (1 hour and 15 minutes) a week of vigorous-intensity aerobic physical activity, or an equivalent combination of moderate- and vigorous-intensity aerobic activity. Aerobic activity should be performed in episodes of at least 10 minutes, and preferably, it should be spread throughout the week.  Behavior Modification:  We discussed the following Behavioral Modification Strategies today: increasing lean protein intake, decreasing simple carbohydrates, increasing vegetables, meal planning and cooking strategies, keeping healthy foods in the home, planning for success, and keep a strict food journal. We discussed various medication options to help [name] with her weight loss efforts and we both agreed to continue tirzepatide  at current dose.  Return in about 4 weeks (around 02/15/2024).   She was informed of the importance of frequent follow up visits to maximize her success with intensive lifestyle modifications for her multiple health conditions.  Attestation Statements:   Reviewed by clinician on day of visit: allergies, medications, problem list, medical history, surgical history, family history, social history, and previous encounter notes.     Miranda Cho, MD

## 2024-01-20 NOTE — Assessment & Plan Note (Signed)
 Anthropometric Measurements Height: 5' 3 (1.6 m) Weight: 258 lb (117 kg) BMI (Calculated): 45.71 Weight at Last Visit: 255 lb Weight Lost Since Last Visit: 0 Weight Gained Since Last Visit: 3 Starting Weight: 243 lb Total Weight Loss (lbs): 0 lb (0 kg) Body Composition  Body Fat %: 48.6 % Fat Mass (lbs): 125.4 lbs Muscle Mass (lbs): 126 lbs Total Body Water  (lbs): 88.2 lbs Visceral Fat Rating : 16 Other Clinical Data Fasting: no Labs: no Today's Visit #: 20 Starting Date: 04/09/22 Comments: Cat 2

## 2024-01-20 NOTE — Assessment & Plan Note (Signed)
 Patient doing well on tirzepatide  but is experiencing significant satiety.  She got 1 month of samples then will try for out of pocket expense.  Will continue CPAP usage and follow up on improvement in osa symptoms while patient on tirzepatide 

## 2024-01-21 ENCOUNTER — Ambulatory Visit (HOSPITAL_COMMUNITY): Payer: Self-pay | Admitting: Pharmacist

## 2024-01-21 LAB — POCT INR: INR: 2.3 (ref 2.0–3.0)

## 2024-01-27 ENCOUNTER — Other Ambulatory Visit (HOSPITAL_COMMUNITY): Payer: Self-pay | Admitting: *Deleted

## 2024-01-27 DIAGNOSIS — I428 Other cardiomyopathies: Secondary | ICD-10-CM

## 2024-01-27 DIAGNOSIS — I5022 Chronic systolic (congestive) heart failure: Secondary | ICD-10-CM

## 2024-01-27 DIAGNOSIS — Z79899 Other long term (current) drug therapy: Secondary | ICD-10-CM

## 2024-01-27 DIAGNOSIS — Z7901 Long term (current) use of anticoagulants: Secondary | ICD-10-CM

## 2024-01-27 DIAGNOSIS — Z95811 Presence of heart assist device: Secondary | ICD-10-CM

## 2024-01-28 ENCOUNTER — Ambulatory Visit (HOSPITAL_COMMUNITY): Payer: Self-pay | Admitting: Pharmacist

## 2024-01-28 LAB — POCT INR: INR: 2.7 (ref 2.0–3.0)

## 2024-02-01 ENCOUNTER — Other Ambulatory Visit (HOSPITAL_COMMUNITY): Payer: Self-pay | Admitting: *Deleted

## 2024-02-01 ENCOUNTER — Ambulatory Visit (HOSPITAL_COMMUNITY): Payer: Self-pay | Admitting: Pharmacist

## 2024-02-01 ENCOUNTER — Ambulatory Visit (HOSPITAL_COMMUNITY): Payer: Self-pay | Admitting: Cardiology

## 2024-02-01 ENCOUNTER — Ambulatory Visit (HOSPITAL_COMMUNITY)
Admission: RE | Admit: 2024-02-01 | Discharge: 2024-02-01 | Disposition: A | Discharge status: Home / Self Care | Source: Ambulatory Visit | Attending: Cardiology | Admitting: Cardiology

## 2024-02-01 DIAGNOSIS — Z95811 Presence of heart assist device: Secondary | ICD-10-CM

## 2024-02-01 DIAGNOSIS — N183 Chronic kidney disease, stage 3 unspecified: Secondary | ICD-10-CM | POA: Insufficient documentation

## 2024-02-01 DIAGNOSIS — Z7901 Long term (current) use of anticoagulants: Secondary | ICD-10-CM | POA: Insufficient documentation

## 2024-02-01 DIAGNOSIS — I5022 Chronic systolic (congestive) heart failure: Secondary | ICD-10-CM | POA: Insufficient documentation

## 2024-02-01 DIAGNOSIS — I428 Other cardiomyopathies: Secondary | ICD-10-CM | POA: Diagnosis not present

## 2024-02-01 DIAGNOSIS — C563 Malignant neoplasm of bilateral ovaries: Secondary | ICD-10-CM | POA: Diagnosis not present

## 2024-02-01 DIAGNOSIS — I2721 Secondary pulmonary arterial hypertension: Secondary | ICD-10-CM | POA: Insufficient documentation

## 2024-02-01 DIAGNOSIS — I081 Rheumatic disorders of both mitral and tricuspid valves: Secondary | ICD-10-CM | POA: Insufficient documentation

## 2024-02-01 DIAGNOSIS — Z7984 Long term (current) use of oral hypoglycemic drugs: Secondary | ICD-10-CM | POA: Diagnosis not present

## 2024-02-01 DIAGNOSIS — I130 Hypertensive heart and chronic kidney disease with heart failure and stage 1 through stage 4 chronic kidney disease, or unspecified chronic kidney disease: Secondary | ICD-10-CM | POA: Insufficient documentation

## 2024-02-01 DIAGNOSIS — Z8673 Personal history of transient ischemic attack (TIA), and cerebral infarction without residual deficits: Secondary | ICD-10-CM | POA: Insufficient documentation

## 2024-02-01 DIAGNOSIS — D631 Anemia in chronic kidney disease: Secondary | ICD-10-CM | POA: Diagnosis not present

## 2024-02-01 DIAGNOSIS — Z79899 Other long term (current) drug therapy: Secondary | ICD-10-CM | POA: Insufficient documentation

## 2024-02-01 DIAGNOSIS — I4891 Unspecified atrial fibrillation: Secondary | ICD-10-CM | POA: Insufficient documentation

## 2024-02-01 DIAGNOSIS — Z7982 Long term (current) use of aspirin: Secondary | ICD-10-CM | POA: Diagnosis not present

## 2024-02-01 DIAGNOSIS — I493 Ventricular premature depolarization: Secondary | ICD-10-CM | POA: Diagnosis not present

## 2024-02-01 DIAGNOSIS — Z95810 Presence of automatic (implantable) cardiac defibrillator: Secondary | ICD-10-CM | POA: Diagnosis not present

## 2024-02-01 DIAGNOSIS — E0590 Thyrotoxicosis, unspecified without thyrotoxic crisis or storm: Secondary | ICD-10-CM | POA: Diagnosis not present

## 2024-02-01 LAB — CBC
HCT: 47 % — ABNORMAL HIGH (ref 36.0–46.0)
Hemoglobin: 14.8 g/dL (ref 12.0–15.0)
MCH: 26 pg (ref 26.0–34.0)
MCHC: 31.5 g/dL (ref 30.0–36.0)
MCV: 82.6 fL (ref 80.0–100.0)
Platelets: 219 K/uL (ref 150–400)
RBC: 5.69 MIL/uL — ABNORMAL HIGH (ref 3.87–5.11)
RDW: 18.3 % — ABNORMAL HIGH (ref 11.5–15.5)
WBC: 8.1 K/uL (ref 4.0–10.5)
nRBC: 0 % (ref 0.0–0.2)

## 2024-02-01 LAB — COMPREHENSIVE METABOLIC PANEL WITH GFR
ALT: 28 U/L (ref 0–44)
AST: 33 U/L (ref 15–41)
Albumin: 3.6 g/dL (ref 3.5–5.0)
Alkaline Phosphatase: 62 U/L (ref 38–126)
Anion gap: 8 (ref 5–15)
BUN: 27 mg/dL — ABNORMAL HIGH (ref 6–20)
CO2: 22 mmol/L (ref 22–32)
Calcium: 9.1 mg/dL (ref 8.9–10.3)
Chloride: 107 mmol/L (ref 98–111)
Creatinine, Ser: 1.39 mg/dL — ABNORMAL HIGH (ref 0.44–1.00)
GFR, Estimated: 46 mL/min — ABNORMAL LOW (ref >60)
Glucose, Bld: 77 mg/dL (ref 70–99)
Potassium: 4.4 mmol/L (ref 3.5–5.1)
Sodium: 137 mmol/L (ref 135–145)
Total Bilirubin: 0.4 mg/dL (ref 0.0–1.2)
Total Protein: 7.5 g/dL (ref 6.5–8.1)

## 2024-02-01 LAB — IRON AND TIBC
Iron: 51 ug/dL (ref 28–170)
Saturation Ratios: 13 % (ref 10.4–31.8)
TIBC: 384 ug/dL (ref 250–450)
UIBC: 333 ug/dL

## 2024-02-01 LAB — VITAMIN B12: Vitamin B-12: 803 pg/mL (ref 180–914)

## 2024-02-01 LAB — FERRITIN: Ferritin: 17 ng/mL (ref 11–307)

## 2024-02-01 LAB — PROTIME-INR
INR: 2.2 — ABNORMAL HIGH (ref 0.8–1.2)
Prothrombin Time: 25.8 s — ABNORMAL HIGH (ref 11.4–15.2)

## 2024-02-01 LAB — FOLATE: Folate: 9.9 ng/mL (ref >5.9)

## 2024-02-01 LAB — PREALBUMIN: Prealbumin: 22 mg/dL (ref 18–38)

## 2024-02-01 LAB — TSH: TSH: 8.848 u[IU]/mL — ABNORMAL HIGH (ref 0.350–4.500)

## 2024-02-01 LAB — LACTATE DEHYDROGENASE: LDH: 234 U/L — ABNORMAL HIGH (ref 98–192)

## 2024-02-01 LAB — MAGNESIUM: Magnesium: 2 mg/dL (ref 1.7–2.4)

## 2024-02-01 LAB — T4, FREE: Free T4: 1.23 ng/dL — ABNORMAL HIGH (ref 0.61–1.12)

## 2024-02-01 NOTE — Patient Instructions (Addendum)
 No medication changes today Coumadin  dosing per [name] Return to clinic in 1 month. We will obtain a RAMP echo at this visit

## 2024-02-01 NOTE — Progress Notes (Addendum)
 Pt presented for 2 month f/u with 2.5 year Intermacs in VAD Clinic today alone. Reports no problems with VAD equipment or concerns with drive line.  Pt states she has been doing well since last visit. Denies lightheadedness, dizziness, falls, shortness of breath, heart failure symptoms, or any signs of bleeding.   Pt had to stop Entresto  due to dizziness, she is now on amlodipine . BP stable today. No med changes today per Dr. Rolan.   100+ PI events noted for today. States she is drinking >2L daily. Will obtain RAMP echo next visit per Dr Rolan   Recently started on Mounjaro  injections by [facility]. Will be transitioning to Zepbound  as she is paying for medication out of pocket.   Vital Signs:  Doppler Pressure: UTO  Automatic BP: 104/77 (86) HR: 71 SR  SPO2: UTO   Weight: 261.2 lbs w/o eqt Last weight: 261 lb w/o eqt  VAD Indication: Destination Therapy d/t BMI   VAD interrogation & Equipment Management: Speed: 5800 Flow: 5.6 Power: 4.9 w    PI: 2.6   Alarms: none Events: 100+  Fixed speed: 5800 Low speed limit: 5500   Primary Controller: Replace back up battery in 30 months. Back up controller: Replace back up battery in 30 months.   Annual Equipment Maintenance on UBC/PM was performed on 08/03/23.    I reviewed the LVAD parameters from today and compared the results to the patient's prior recorded data. LVAD interrogation was NEGATIVE for significant power changes, NEGATIVE for clinical alarms and STABLE for PI events/speed drops. No programming changes were made and pump is functioning within specified parameters. Pt is performing daily controller and system monitor self tests along with completing weekly and monthly maintenance for LVAD equipment.   LVAD equipment check completed and is in good working order. Back-up equipment present. Charged back up battery and performed self-test on equipment.   Exit Site Care: CDI. Drive line anchor secure. Pt  denies fever or chills. Provided with 4 weekly kits.  Significant Events on VAD Support:  N/A  Device: Medtronic Therapies: on VF 240 VT 200 VVI: 40 Last check: 11/05/23  BP & Labs:  Doppler  - Doppler correlated with MAP- attempted multiple times- unable to obtain today   Hgb 14.8 - No S/S of bleeding. Specifically denies vaginal bleeding, melena/BRBPR or nosebleeds.    LDH 234 with established baseline of 250 - 450. Denies tea-colored urine. No power elevations noted on interrogation.   2.5 year Intermacs follow up completed including:  Quality of Life, KCCQ-12, and Neurocognitive trail making.   Pt completed 1350 feet during 6 minute walk.  Back up controller: 11V backup battery charged during this visit.  Patient Goals: continue trying to lose weight to eventually qualify for transplant in the future  [place] Cardiomyopathy Questionnaire     02/01/2024   10:57 AM 03/17/2023    2:21 PM 09/08/2022    3:45 PM  KCCQ-12  1 a. Ability to shower/bathe Not at all limited Not at all limited Not at all limited  1 b. Ability to walk 1 block Not at all limited Not at all limited Not at all limited  1 c. Ability to hurry/jog Slightly limited Moderately limited Moderately limited  2. Edema feet/ankles/legs Less than once a week Never over the past 2 weeks Never over the past 2 weeks  3. Limited by fatigue Less than once a week Never over the past 2 weeks Less than once a week  4.  Limited by dyspnea Less than once a week Never over the past 2 weeks Less than once a week  5. Sitting up / on 3+ pillows Never over the past 2 weeks Never over the past 2 weeks Never over the past 2 weeks  6. Limited enjoyment of life Not limited at all Not limited at all Not limited at all  7. Rest of life w/ symptoms Completely satisfied Completely satisfied Completely satisfied  8 a. Participation in hobbies Did not limit at all Did not limit at all Did not limit at all  8 b. Participation in chores Did  not limit at all Slightly limited Slightly limited  8 c. Visiting family/friends Did not limit at all Did not limit at all Did not limit at all    Plan:  No medication changes today Coumadin  dosing per [name] Return to clinic in 1 month. We will obtain a RAMP echo at this visit  Isaiah Knoll RN VAD Coordinator  Office: [phone]  24/7 Pager: [phone]   Chief Complaint: LVAD followup, CHF    History of Present Illness  [name] is a 53 y.o. 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  She was followed for systolic CHF initially in [place]. Apparently her EF recovered to 35-40%. However, she was admitted to [facility] 09/19/13 for worsening dyspnea and CP. Coronaries were reportedly OK on LHC at that time at [facility]. Echo showed EF of 15-20% with four-chamber enlargement with moderate-severe TR/MR. RHC as below. Rheumatological w/u negative.  CPX in 9/16 showed near-normal functional capacity.  Repeat echo in 6/17 shows that EF remains 20%. Repeat CPX in 1/18 was submaximal but probably only mild circulatory limitation.  Echo 8/18 showed EF 25-30%, severe LV dilation.  CPX in 6/19 showed low normal functional capacity.  Echo in 10/19 showed EF 20-25%, moderate LV dilation.    Zio patch in 2/20 with 11% PVCs and NSVT, amiodarone  started. Zio patch in 3/20 with 13.3% PVCs and NSVT.  She saw Dr. Fernande, he thought that increased PVCs were electromechanical in nature due to worsening heart failure.  Repeat Zio patch on amiodarone  in 1/21 showed 6.2% PVCs.   She was admitted in 4/20 with VT in setting of hypokalemia.    CPX in 9/20 showed mild functional limitation, more due to body habitus than heart failure.  Echo in 4/21 showed EF 20% with severe LV dilation, diffuse hypokinesis, normal RV size and systolic function.   In 8/21, she had a shock for VF in the setting of  hypokalemia.   Patient was admitted in 10/21 with CVA involving left MCA territory.  This was thought to be cardioembolic but LV thrombus was not visualized and no atrial fibrillation has been seen.  Echo in 10/21 showed EF 20-25%, severe LV dilation (no LV thrombus seen), mildly decreased RV systolic function, mild-moderate MR. She was taken by IR for thrombectomy and stent placement, now on ASA 81 and ticagrelor .   She had recurrent VT in 2/22, quinidine was added by EP.  Spironolactone  and Coreg  were decreased due to low BP and lightheadedness.    Echo in 4/22 showed EF 25% with severe LV dilation and global hypokinesis, normal RV, mild-moderate MR.   CPX (8/22) showed peak VO2 14.3, VE/VCO2 slope 42, RER 1.09 => mild to moderate HF limitation, elevated VE/VCO2 slope is likely false from end exercise hyperventilation. Restrictive lung  physiology from obesity.   Patient was admitted in 3/23 with recurrent CHF exacerbation with low cardiac output.  She was also noted to be hyperthyroidism but amiodarone  had to be continued due to VT and poorly tolerated atrial fibrillation.  She required Impella 5.5 placement and eventual HM3 LVAD placement on 09/04/21.  Post-op course complicated by atrial fibrillation, malnutrition, and deconditioning.  She went to CIR prior to discharge.   Patient developed heavy vaginal bleeding in 7/23, she was seen in the ER and required 2 units PRBCs.  She was admitted in 8/23 and had hysterectomy + BSO, LVAD speed increased to 5900 rpm on ramp echo.  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    The patient was admitted overnight in 12/23 with severe posterior epistaxis requiring nasal pacing.   In 2/24, she had VF with shock.  Amiodarone  was restarted.   Echo in 7/24 showed  EF < 20, mild RV dysfunction with normal size, interventricular septum shifted mildly to right.   At a previous visit in 12/2022, her VAD speed was increased to  6000. She took a torsemide  a day before presenting to clinic in 02/2023 and noted an increased frequency of PI events despite drinking water . Her speed was reduced to 5900 rpm. She continued to have suction events and speed was decreased to 5700 rpm in 11/24.   Ramp echo was done in 12/24, septum bowed to right at 5700 rpm, so speed increased to 5800 rpm.  The aortic valve opened each beat at 5800 rpm with moderate RV dysfunction/normal RV size.   She returns for followup of LVAD.  Weight is stable.  She is rarely taking torsemide  (uses prn).  She is back on tirzepatide .  She has been more active, denies exertional dyspnea.  No lightheadedness.  MAP stable, 86 today.  She is noted to have more frequent PIs recently, up to 100+ per day.  No low flow events. She is in NSR on telemetry.   LVAD interrogation: Personally reviewed.  Please see LVAD nursing note above.    Labs (4/25): TSH 4.91, mildly elevated Labs (5/25): hgb 13.4, creatinine 1.3 Labs (6/25): K 4, creatinine 1.34  PMH: 1. OSA: Using CPAP.  2. PVCs:  - Zio patch 2/20: 11% PVCs - Zio patch 3/20: 13.3% PVCs - Zio patch 1/21: 6.2% PVCs 3. VT/VF: 8/21, in setting of hypokalemia.  2/22 VT, quinidine added.  - VT 2/24, restarted amiodarone  4. 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  No LV thrombus visualized and atrial fibrillation has not been visualized.  - IR thrombectomy with stent placement in 10/21.  5. Chronic systolic CHF: Nonischemic cardiomyopathy:   - Coronary angiography 2015 at [facility]: Normal coronaries.  - Echo (8/15) with EF 20%, moderate LV dilation, diffuse hypokinesis, normal RV size with mildly decreased systolic function, mild MR.  - Echo (5/16) with EF 20%, spherical LV with diffuse hypokinesis, mild MR.  - Echo (6/17) with EF 20%, diffuse hypokinesis, moderate LV dilation, normal RV, mild to moderate MR.  - RHC (12/17): mean RA 5, PA 56/31, mean PCWP 24, CI 2, PVR 3.7 WU - Echo (8/18) with EF 25-30%, severe LV dilation,  severe LAE.  - Echo (2/20) with EF 20-25%, moderate LV dilation, moderate MR.  - Echo (4/21) with EF 20-25%, severe LV dilation, mild-moderate MR, normal RV, PASP 26 mmHg.  - Echo (10/21) with EF 20-25%, severe LV dilation (no LV thrombus seen), mildly decreased RV systolic  function, mild-moderate MR. - CPX in 9/20 showed mild functional limitation, more due to body habitus than heart failure.  - CPX in 8/21: Peak VO2 11.6, VE/VCO2 32, RER 1.2.  Mild HF limitation.  - Echo (4/22): EF 25% with severe LV dilation and global hypokinesis, normal RV, mild-moderate MR.  - CPX (8/22): peak VO2 14.3, VE/VCO2 slope 42, RER 1.09 => mild to moderate HF limitation, elevated VE/VCO2 slope is likely false from end exercise hyperventilation. Restrictive lung physiology from obesity.  - HM3 LVAD placement 09/04/21.  - Echo (7/24): EF < 20, mild RV dysfunction with normal size, interventricular septum shifted mildly to right. - Ramp echo (12/24): Speed increased to 5800 rpm.  Aortic valve opens each beat, moderate RV dysfunction with normal RV size.  6. PFTs (1/22): Near normal.  7. Fe deficiency anemia 8. 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  10. Left ovarian mass -  hysterectomy + BSO.  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      Current Outpatient Medications  Medication Sig Dispense Refill   ALPRAZolam  (XANAX ) 0.25 MG tablet Take 1 tablet (0.25 mg total) by mouth 2 (two) times daily as needed for anxiety. 20 tablet 2   amiodarone  (PACERONE ) 200 MG tablet Take 1 tablet (200 mg total) by mouth daily. 90 tablet 3   amLODipine  (NORVASC ) 5 MG tablet Take 1 tablet (5 mg total) by mouth daily. 180 tablet 3   ASPIRIN  LOW DOSE 81 MG tablet TAKE ONE TABLET BY MOUTH ONCE A DAY 30 tablet 11   Cholecalciferol (VITAMIN D3) 125 MCG (5000 UT) CAPS Take 5,000 Units by mouth.     dapagliflozin  propanediol (FARXIGA ) 10 MG TABS tablet  Take 1 tablet (10 mg total) by mouth daily before breakfast. 90 tablet 3   estradiol  (CLIMARA  - DOSED IN MG/24 HR) 0.0375 mg/24hr patch Place 1 patch (0.0375 mg total) onto the skin every 7 (seven) days. 4 patch 11   quiniDINE gluconate  324 MG CR tablet Take 1 tablet (324 mg total) by mouth 2 (two) times daily. 60 tablet 6   rosuvastatin  (CRESTOR ) 40 MG tablet Take 1 tablet (40 mg total) by mouth daily. 90 tablet 3   sildenafil  (REVATIO ) 20 MG tablet Take 1 tablet (20 mg total) by mouth 3 (three) times daily. 90 tablet 6   spironolactone  (ALDACTONE ) 25 MG tablet Take 1 tablet (25 mg total) by mouth daily. 90 tablet 3   tirzepatide  5 MG/0.5ML injection vial Inject 5 mg into the skin once a week. 2 mL 1   warfarin (COUMADIN ) 2 MG tablet Take 1 tablet (2 mg) every Tue, Thu, Sat and 2 tablets (4 mg) all other days or as directed by LVAD Clinic.     potassium chloride  SA (KLOR-CON  M) 20 MEQ tablet Take 1 tablet (20 mEq total) by mouth daily. (Patient not taking: Reported on 02/01/2024) 270 tablet 3   tirzepatide  (MOUNJARO ) 2.5 MG/0.5ML Pen Inject 2.5 mg into the skin once a week.     torsemide  (DEMADEX ) 20 MG tablet Take 1 tablet (20 mg total) by mouth every other day. Take 1 tablet (20 mg) by mouth as needed, or as directed by heart failure clinic. (Patient not taking: Reported on 02/01/2024) 180 tablet 3   No current facility-administered medications for this encounter.    Allergies:   Patient has no known allergies.   Social History:  The patient  reports that she has never smoked.  She has never used smokeless tobacco. She reports that she does not drink alcohol and does not use drugs.   Family History:  The patient's family history includes Heart Problems in her mother; Heart disease in her maternal grandmother and mother; High blood pressure in her father; Hypertension in her father and mother; Obesity in her mother; Stroke in her father.   ROS:  Please see the history of present illness.   All  other systems are personally reviewed and negative.   Exam:    MAP 86 General: Well appearing this am. NAD.  HEENT: Normal. Neck: Supple, JVP 7-8 cm. Carotids OK.  Cardiac:  Mechanical heart sounds with LVAD hum present.  Lungs:  CTAB, normal effort.  Abdomen:  NT, ND, no HSM. No bruits or masses. +BS  LVAD exit site: Well-healed and incorporated. Dressing dry and intact. No erythema or drainage. Stabilization device present and accurately applied. Driveline dressing changed daily per sterile technique. Extremities:  Warm and dry. No cyanosis, clubbing, rash, or edema.  Neuro:  Alert & oriented x 3. Cranial nerves grossly intact. Moves all 4 extremities w/o difficulty. Affect pleasant    Recent Labs: 02/01/2024: ALT 28; BUN 27; Creatinine, Ser 1.39; Hemoglobin 14.8; Magnesium  2.0; Platelets 219; Potassium 4.4; Sodium 137; TSH 8.848  Personally reviewed   Wt Readings from Last 3 Encounters:  02/01/24 118.5 kg (261 lb 3.2 oz)  01/18/24 117 kg (258 lb)  01/05/24 118.5 kg (261 lb 3.2 oz)    ASSESSMENT AND PLAN:   1. Chronic systolic HF: Nonischemic cardiomyopathy.  She has a Medtronic ICD. Cause for cardiomyopathy is uncertain, initially identified peri-partum (after son's birth), so cannot rule out peri-partum CMP.  On and off, she has had frequent PVCs.   RHC in 8/15 showed normal filling pressures and preserved cardiac output. Echo in 4/22 with EF 25%, severe LV dilation.  CPX (8/22) with mild-moderate HF limitation.  Echo in 3/23 with EF < 20% with severe dilation, normal RV size with mildly decreased systolic function, moderate-severe functional MR with dilated IVC. 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 Impella 5.5 placed 08/29/21.  Patient had HM3 LVAD placed 09/04/21 and speed adjusted by ramp echo up to 5800 rpm and again up to 5900 rpm. In 7/24,  LVAD speed increased to 6000 rpm under echo guidance. Developed PI and suction events, speed decreased to 5700 rpm.  In 12/24, ramp echo done and speed increased to 5800 rpm; the aortic valve opened each beat and the septum was midline.  NYHA class I-II, not volume overloaded on exam. Recently noted to have more PI events, no low flow events.  She feels great, NSR on telemetry, BP controlled.  I am uncertain of the cause.  She has not been taking torsemide .  - I asked her to stay more hydrated for now and will arrange for ramp echo next visit.   - She has torsemide  for use prn.  - Continue Farxiga  10 mg daily.  - Continue spironolactone  25 mg daily.  - Stopped Entresto  due to lightheadedness and low flow events.  - Continue amlodipine  5 mg daily for BP control.  - She should continue ASA 81 mg daily with history of CVA.  - Continue sildenafil  20 mg tid  - Warfarin for VAD, goal INR 2-2.5.   2. Hyperthyroidism: Has history of hypothroidism thought to be associated with amiodarone .  She had been on Levoxyl  at home prior to 3/23 admission, noted to be hyperthyroid in 3/23.  This likely contributes to her CHF. She had recurrent VT and poorly tolerated AF which made it difficult for us  to stop amiodarone . Initially treated w/ IV Solumedrol -> transitioned to prednisone  and methimazole .  Endocrinology following, prednisone  has been titrated off, now titrated off methimazole .  We had to restart her on amiodarone  with VF.  - Continue amiodarone  200 daily, check LFTs and TSH today and will need regular eye exam.  3. PVCs/VT: EP consulted given complicated situation with hyperthyroidism and VT. She had a VF episode and amiodarone  was restarted.  - Continue quinidine.  - Continue amiodarone , check LFTs and TSH as above.  4. CVA: CVA in 10/21, had thrombectomy with stent placement.  No LV thrombus noted and no atrial fibrillation noted.  - Would continue ASA 81 daily.   5. Anemia: CBC today.  6. CKD stage 3:  BMET today.  7. Mitral regurgitation: Moderate-severe functional MR pre-LVAD.  Unlikely to be Mitraclip candidate with severely dilated LV (>7 cm). Mitral regurgitation was mild on 7/24 echo and 12/24 echo.  8. Left ovarian mass: Hysterectomy + BSO.  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   9. Atrial fibrillation: Tolerates poorly. Has been in NSR and is in NSR today.   Followup in 1 month with ramp echo   I spent 42 minutes reviewing records and LVAD interrogation, examining/interviewing patient, and organizing orders.    Ezra Shuck, MD  02/01/2024  [facility] [address] [facility] [place] [address] [phone] (office) [phone] (fax)

## 2024-02-02 ENCOUNTER — Telehealth: Payer: Self-pay | Admitting: *Deleted

## 2024-02-02 LAB — T3, FREE: T3, Free: 2.2 pg/mL (ref 2.0–4.4)

## 2024-02-02 NOTE — Telephone Encounter (Signed)
 LMOM for the patient to call the office back. Patient to be offered an earlier appt in the week

## 2024-02-03 ENCOUNTER — Other Ambulatory Visit: Payer: Self-pay | Admitting: Gynecologic Oncology

## 2024-02-03 DIAGNOSIS — D3910 Neoplasm of uncertain behavior of unspecified ovary: Secondary | ICD-10-CM

## 2024-02-03 NOTE — Telephone Encounter (Signed)
 LMOM for the patient to call the office back. Patient to be offered an earlier appt in the week

## 2024-02-03 NOTE — Telephone Encounter (Signed)
 Patient returned call and appts moved

## 2024-02-04 ENCOUNTER — Encounter: Payer: Self-pay | Admitting: Gynecologic Oncology

## 2024-02-04 ENCOUNTER — Inpatient Hospital Stay: Admitting: Gynecologic Oncology

## 2024-02-04 ENCOUNTER — Ambulatory Visit (INDEPENDENT_AMBULATORY_CARE_PROVIDER_SITE_OTHER): Payer: Self-pay

## 2024-02-04 ENCOUNTER — Inpatient Hospital Stay: Attending: Gynecologic Oncology

## 2024-02-04 VITALS — HR 74 | Temp 98.4°F | Resp 20 | Wt 259.0 lb

## 2024-02-04 DIAGNOSIS — N951 Menopausal and female climacteric states: Secondary | ICD-10-CM

## 2024-02-04 DIAGNOSIS — D3910 Neoplasm of uncertain behavior of unspecified ovary: Secondary | ICD-10-CM | POA: Diagnosis not present

## 2024-02-04 DIAGNOSIS — Z90722 Acquired absence of ovaries, bilateral: Secondary | ICD-10-CM | POA: Diagnosis not present

## 2024-02-04 DIAGNOSIS — Z79890 Hormone replacement therapy (postmenopausal): Secondary | ICD-10-CM | POA: Insufficient documentation

## 2024-02-04 DIAGNOSIS — I428 Other cardiomyopathies: Secondary | ICD-10-CM

## 2024-02-04 DIAGNOSIS — Z90710 Acquired absence of both cervix and uterus: Secondary | ICD-10-CM | POA: Diagnosis not present

## 2024-02-04 NOTE — Patient Instructions (Signed)
It was good to see you today.  I do not see or feel any evidence of cancer recurrence on your exam.  We will see you for follow-up in 6 months.  As always, if you develop any new and concerning symptoms before your next visit, please call to see me sooner.

## 2024-02-04 NOTE — Progress Notes (Signed)
 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  Interval History: Doing well.  Denies any vaginal bleeding.  Reports baseline intermittent constipation.  Has hot flashes if she forgets to put a patch on her falls off, otherwise menopausal symptoms are well-controlled with her estrogen patch.  Past Medical/Surgical History: Past Medical History:  Diagnosis Date   Anemia    Automatic implantable cardioverter-defibrillator in situ    B12 deficiency    Cancer (HCC)    Chronic CHF (congestive heart failure) (HCC)    a. EF 15-20% b. RHC (09/2013) RA 14, RV 57/22, PA 64/36 (48), PCWP 18, FIck CO/CI 3.7/1.6, PVR 8.1 WU, PA sat 47%    Chronic kidney disease    Heart valve problem    History of heart attack    History of hypothyroidism    History of stomach ulcers    HLD (hyperlipidemia)    Hyperthyroidism    Hypotension    Hypothyroidism    Morbid obesity (HCC)    Myocardial infarction (HCC) 08/2013   Nocturnal dyspnea    Nonischemic cardiomyopathy (HCC)    Palpitations    Sinus tachycardia    Sleep apnea    Snoring-prob OSA 09/04/2011   SOB (shortness of breath)    Sprint Fidelis ICD lead RECALL  6949    Stroke (HCC) 2021   some residual right-sided weakness   UARS (upper airway resistance syndrome) 09/04/2011   HST 12/2013:  AHI 4/hr (numerous episodes of airflow reduction that did not have concomitant desaturation)    Vitamin D  deficiency     Past Surgical History:  Procedure Laterality Date   BREATH TEK H PYLORI N/A 11/09/2014   Procedure: BREATH TEK H PYLORI;  Surgeon: Camellia Blush, MD;  Location: [facility];  Service: General;  Laterality: N/A;   CARDIAC CATHETERIZATION  ~ 2006; 09/2013   CARDIAC CATHETERIZATION N/A  05/23/2016   Procedure: Right Heart Cath;  Surgeon: Ezra GORMAN Shuck, MD;  Location: [facility] INVASIVE CV LAB;  Service: Cardiovascular;  Laterality: N/A;   CARDIAC DEFIBRILLATOR PLACEMENT  2006; 12/26/2013   Medtronic [id number]; 12-2013 ICD gen change and RV lead revision with new 6935 RV lead by Dr Fernande   CENTRAL LINE INSERTION  08/28/2021   Procedure: CENTRAL LINE INSERTION;  Surgeon: Shuck Ezra GORMAN, MD;  Location: [facility] INVASIVE CV LAB;  Service: Cardiovascular;;   CESAREAN SECTION  06/23/1997   IMPLANTABLE CARDIOVERTER DEFIBRILLATOR GENERATOR CHANGE N/A 12/26/2013   Procedure: IMPLANTABLE CARDIOVERTER DEFIBRILLATOR GENERATOR CHANGE;  Surgeon: Elspeth JAYSON Fernande, MD;  Location: [facility] CATH LAB;  Service: Cardiovascular;  Laterality: N/A;   INSERTION OF IMPLANTABLE LEFT VENTRICULAR ASSIST DEVICE N/A 09/03/2021   Procedure: INSERTION OF IMPLANTABLE LEFT VENTRICULAR ASSIST DEVICE/ Heartmate 3;  Surgeon: Obadiah Coy, MD;  Location: [facility] OR;  Service: Open Heart Surgery;  Laterality: N/A;   IR CT HEAD LTD  04/17/2020   IR CT HEAD LTD  04/17/2020   IR INTRA CRAN STENT  04/17/2020   IR PERCUTANEOUS ART THROMBECTOMY/INFUSION INTRACRANIAL INC DIAG ANGIO  04/17/2020   IR RADIOLOGIST EVAL & MGMT  06/05/2020   LEAD REVISION N/A 12/26/2013   Procedure: LEAD REVISION;  Surgeon: Elspeth JAYSON Fernande, MD;  Location: [facility] CATH LAB;  Service: Cardiovascular;  Laterality: N/A;   LEFT VENTRICULAR ASSIST DEVICE  PLACEMENT OF IMPELLA LEFT VENTRICULAR ASSIST DEVICE Right 08/29/2021   Procedure: PLACEMENT OF IMPELLA 5.5 LEFT VENTRICULAR ASSIST DEVICE;  Surgeon: Lucas Dorise POUR, MD;  Location: [facility] OR;  Service: Open Heart Surgery;  Laterality: Right;   RADIOLOGY WITH ANESTHESIA N/A 04/17/2020   Procedure: Code Stroke;  Surgeon: Dolphus Carrion, MD;  Location: [facility] OR;  Service: Radiology;  Laterality: N/A;   REMOVAL OF IMPELLA LEFT VENTRICULAR ASSIST DEVICE N/A 09/03/2021   Procedure: REMOVAL OF IMPELLA LEFT VENTRICULAR ASSIST  DEVICE;  Surgeon: Obadiah Coy, MD;  Location: [facility] OR;  Service: Open Heart Surgery;  Laterality: N/A;   RIGHT HEART CATH N/A 08/28/2021   Procedure: RIGHT HEART CATH;  Surgeon: Rolan Ezra RAMAN, MD;  Location: [facility] INVASIVE CV LAB;  Service: Cardiovascular;  Laterality: N/A;   RIGHT HEART CATHETERIZATION N/A 02/15/2014   Procedure: RIGHT HEART CATH;  Surgeon: Ezra RAMAN Rolan, MD;  Location: [facility] CATH LAB;  Service: Cardiovascular;  Laterality: N/A;   ROBOTIC ASSISTED TOTAL HYSTERECTOMY WITH BILATERAL SALPINGO OOPHERECTOMY N/A 02/05/2022   Procedure: XI ROBOTIC ASSISTED TOTAL HYSTERECTOMY WITH BILATERAL SALPINGO OOPHORECTOMY, CYSTOSCOPY, UTERUS GREATER THAN 250 GRAMS;  Surgeon: Viktoria Comer SAUNDERS, MD;  Location: [facility] OR;  Service: Gynecology;  Laterality: N/A;   TEE WITHOUT CARDIOVERSION N/A 08/29/2021   Procedure: TRANSESOPHAGEAL ECHOCARDIOGRAM (TEE);  Surgeon: Lucas Dorise POUR, MD;  Location: [facility] OR;  Service: Open Heart Surgery;  Laterality: N/A;   TEE WITHOUT CARDIOVERSION N/A 09/03/2021   Procedure: TRANSESOPHAGEAL ECHOCARDIOGRAM (TEE);  Surgeon: Obadiah Coy, MD;  Location: [facility] OR;  Service: Open Heart Surgery;  Laterality: N/A;   TUBAL LIGATION  06/23/1997    Family History  Problem Relation Age of Onset   Hypertension Mother    Heart disease Mother    Obesity Mother    Heart Problems Mother    Hypertension Father    High blood pressure Father    Stroke Father    Heart disease Maternal Grandmother    Colon cancer Neg Hx    Breast cancer Neg Hx    Ovarian cancer Neg Hx    Endometrial cancer Neg Hx    Pancreatic cancer Neg Hx    Prostate cancer Neg Hx    Liver disease Neg Hx    Cancer - Colon Neg Hx     Social History   Socioeconomic History   Marital status: Married    Spouse name: Luma Clopper   Number of children: 2   Years of education: Not on file   Highest education level: Not on file  Occupational History   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  Tobacco  Use   Smoking status: Never   Smokeless tobacco: Never  Vaping Use   Vaping status: Never Used  Substance and Sexual Activity   Alcohol use: No   Drug use: No   Sexual activity: Not Currently  Other Topics Concern   Not on file  Social History Narrative   Dorisann sometimes   Right handed   Social Drivers of Health   Financial Resource Strain: Low Risk  (10/21/2023)   Overall Financial Resource Strain (CARDIA)    Difficulty of Paying Living Expenses: Not hard at all  Food Insecurity: No Food Insecurity (10/21/2023)   Hunger Vital Sign    Worried About Running Out of Food in the Last Year: Never true    Ran Out of Food in the Last Year: Never true  Transportation Needs: No Transportation Needs (10/21/2023)   PRAPARE - Transportation  Lack of Transportation (Medical): No    Lack of Transportation (Non-Medical): No  Physical Activity: Insufficiently Active (10/21/2023)   Exercise Vital Sign    Days of Exercise per Week: 2 days    Minutes of Exercise per Session: 40 min  Stress: No Stress Concern Present (10/21/2023)   [employer] of Occupational Health - Occupational Stress Questionnaire    Feeling of Stress : Only a little  Social Connections: Socially Integrated (10/21/2023)   Social Connection and Isolation Panel    Frequency of Communication with Friends and Family: More than three times a week    Frequency of Social Gatherings with Friends and Family: More than three times a week    Attends Religious Services: More than 4 times per year    Active Member of [employer] or Organizations: Yes    Attends Engineer, structural: More than 4 times per year    Marital Status: Married    Current Medications:  Current Outpatient Medications:    ALPRAZolam  (XANAX ) 0.25 MG tablet, Take 1 tablet (0.25 mg total) by mouth 2 (two) times daily as needed for anxiety., Disp: 20 tablet, Rfl: 2   amiodarone  (PACERONE ) 200 MG tablet, Take 1 tablet (200 mg total) by mouth daily., Disp: 90  tablet, Rfl: 3   amLODipine  (NORVASC ) 5 MG tablet, Take 1 tablet (5 mg total) by mouth daily., Disp: 180 tablet, Rfl: 3   ASPIRIN  LOW DOSE 81 MG tablet, TAKE ONE TABLET BY MOUTH ONCE A DAY, Disp: 30 tablet, Rfl: 11   Cholecalciferol (VITAMIN D3) 125 MCG (5000 UT) CAPS, Take 5,000 Units by mouth., Disp: , Rfl:    dapagliflozin  propanediol (FARXIGA ) 10 MG TABS tablet, Take 1 tablet (10 mg total) by mouth daily before breakfast., Disp: 90 tablet, Rfl: 3   estradiol  (CLIMARA  - DOSED IN MG/24 HR) 0.0375 mg/24hr patch, Place 1 patch (0.0375 mg total) onto the skin every 7 (seven) days., Disp: 4 patch, Rfl: 11   potassium chloride  SA (KLOR-CON  M) 20 MEQ tablet, Take 1 tablet (20 mEq total) by mouth daily. (Patient not taking: Reported on 02/01/2024), Disp: 270 tablet, Rfl: 3   quiniDINE gluconate  324 MG CR tablet, Take 1 tablet (324 mg total) by mouth 2 (two) times daily., Disp: 60 tablet, Rfl: 6   rosuvastatin  (CRESTOR ) 40 MG tablet, Take 1 tablet (40 mg total) by mouth daily., Disp: 90 tablet, Rfl: 3   sildenafil  (REVATIO ) 20 MG tablet, Take 1 tablet (20 mg total) by mouth 3 (three) times daily., Disp: 90 tablet, Rfl: 6   spironolactone  (ALDACTONE ) 25 MG tablet, Take 1 tablet (25 mg total) by mouth daily., Disp: 90 tablet, Rfl: 3   tirzepatide  (MOUNJARO ) 2.5 MG/0.5ML Pen, Inject 2.5 mg into the skin once a week., Disp: , Rfl:    tirzepatide  5 MG/0.5ML injection vial, Inject 5 mg into the skin once a week., Disp: 2 mL, Rfl: 1   torsemide  (DEMADEX ) 20 MG tablet, Take 1 tablet (20 mg total) by mouth every other day. Take 1 tablet (20 mg) by mouth as needed, or as directed by heart failure clinic. (Patient not taking: Reported on 02/01/2024), Disp: 180 tablet, Rfl: 3   warfarin (COUMADIN ) 2 MG tablet, Take 1 tablet (2 mg) every Tue, Thu, Sat and 2 tablets (4 mg) all other days or as directed by LVAD Clinic., Disp: , Rfl:   Review of Systems: Denies appetite changes, fevers, chills, fatigue, unexplained weight  changes. Denies hearing loss, neck lumps or masses, mouth sores,  ringing in ears or voice changes. Denies cough or wheezing.  Denies shortness of breath. Denies chest pain or palpitations. Denies leg swelling. Denies abdominal distention, pain, blood in stools, constipation, diarrhea, nausea, vomiting, or early satiety. Denies pain with intercourse, dysuria, frequency, hematuria or incontinence. Denies hot flashes, pelvic pain, vaginal bleeding or vaginal discharge.   Denies joint pain, back pain or muscle pain/cramps. Denies itching, rash, or wounds. Denies dizziness, headaches, numbness or seizures. Denies swollen lymph nodes or glands, denies easy bruising or bleeding. Denies anxiety, depression, confusion, or decreased concentration.  Physical Exam: Pulse 74   Temp 98.4 F (36.9 C) (Oral)   Resp 20   Wt 259 lb (117.5 kg)   LMP 03/28/2021 (Approximate)   SpO2 99%   BMI 45.88 kg/m  General: Alert, oriented, no acute distress. HEENT: Normocephalic, atraumatic, sclera anicteric. Chest: Unlabored breathing on room air.  Lungs clear to auscultation bilaterally. Abdomen: Obese, soft, nontender.  Normoactive bowel sounds.  No masses or hepatosplenomegaly appreciated.  Well-healed incisions. Extremities: Grossly normal range of motion.  Warm, well perfused.  No edema bilaterally. Skin: No rashes or lesions noted. Lymphatics: No cervical, supraclavicular, or inguinal adenopathy. GU: Normal appearing external genitalia without erythema, excoriation, or lesions.  Speculum exam reveals cuff intact, no lesions or atypical vascularity.  Bimanual exam reveals no masses or nodularity.  Laboratory & Radiologic Studies: Component Ref Range & Units (hover) 4 mo ago (09/11/23) 11 mo ago (03/10/23) 1 yr ago (09/19/22) 1 yr ago (03/13/22)  Inhibin B <7.0 <7.0 CM <7.0 CM <7.0 CM   Assessment & Plan: [name] is a 53 y.o. 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  Surgery 01/2022.    Patient is overall doing well, NED on exam today.  Inhibin B performed today and she will be contacted with the results.   On estrogen patch given improvement in hot flashes.  Happy at current dose, will discuss at future visits decreasing to lowest patch dosing.   We will continue with surveillance visits at this time and an interval of every 6 months. She will see Melissa for her next follow-up.   We discussed signs and symptoms that would be concerning for recurrence of granulosa cell tumor and she is advised to call the office for any needs, concerns, or new persistent symptoms.  20 minutes of total time was spent for this patient encounter, including preparation, face-to-face counseling with the patient and coordination of care, and documentation of the encounter.  Comer Dollar, MD  Division of Gynecologic Oncology  Department of Obstetrics and Gynecology  [facility]

## 2024-02-05 ENCOUNTER — Inpatient Hospital Stay: Admitting: Gynecologic Oncology

## 2024-02-05 ENCOUNTER — Inpatient Hospital Stay

## 2024-02-05 LAB — CUP PACEART REMOTE DEVICE CHECK
Battery Remaining Longevity: 15 mo
Battery Voltage: 2.91 V
Brady Statistic RV Percent Paced: 0.01 %
Date Time Interrogation Session: 20250814213527
HighPow Impedance: 82 Ohm
Implantable Lead Connection Status: 753985
Implantable Lead Implant Date: 20150706
Implantable Lead Location: 753860
Implantable Pulse Generator Implant Date: 20150706
Lead Channel Impedance Value: 380 Ohm
Lead Channel Impedance Value: 494 Ohm
Lead Channel Pacing Threshold Amplitude: 1 V
Lead Channel Pacing Threshold Pulse Width: 0.4 ms
Lead Channel Sensing Intrinsic Amplitude: 6.125 mV
Lead Channel Sensing Intrinsic Amplitude: 6.125 mV
Lead Channel Setting Pacing Amplitude: 2 V
Lead Channel Setting Pacing Pulse Width: 0.4 ms
Lead Channel Setting Sensing Sensitivity: 0.3 mV

## 2024-02-07 ENCOUNTER — Ambulatory Visit: Payer: Self-pay | Admitting: Cardiology

## 2024-02-07 ENCOUNTER — Encounter: Payer: Self-pay | Admitting: Neurology

## 2024-02-09 ENCOUNTER — Ambulatory Visit: Payer: Self-pay | Admitting: Gynecologic Oncology

## 2024-02-09 LAB — INHIBIN B: Inhibin B: <7 pg/mL

## 2024-02-11 ENCOUNTER — Ambulatory Visit (HOSPITAL_COMMUNITY): Payer: Self-pay | Admitting: Pharmacist

## 2024-02-11 LAB — POCT INR: INR: 2.9 (ref 2.0–3.0)

## 2024-02-12 ENCOUNTER — Ambulatory Visit: Admitting: Gynecologic Oncology

## 2024-02-12 ENCOUNTER — Other Ambulatory Visit

## 2024-02-18 ENCOUNTER — Ambulatory Visit (HOSPITAL_COMMUNITY): Payer: Self-pay | Admitting: Pharmacist

## 2024-02-18 LAB — POCT INR: INR: 2.6 (ref 2.0–3.0)

## 2024-02-23 ENCOUNTER — Ambulatory Visit (INDEPENDENT_AMBULATORY_CARE_PROVIDER_SITE_OTHER): Admitting: Family Medicine

## 2024-02-23 ENCOUNTER — Encounter (INDEPENDENT_AMBULATORY_CARE_PROVIDER_SITE_OTHER): Payer: Self-pay | Admitting: Family Medicine

## 2024-02-23 VITALS — HR 72 | Temp 98.1°F | Ht 63.0 in | Wt 254.0 lb

## 2024-02-23 DIAGNOSIS — Z6841 Body Mass Index (BMI) 40.0 and over, adult: Secondary | ICD-10-CM

## 2024-02-23 DIAGNOSIS — Z95811 Presence of heart assist device: Secondary | ICD-10-CM | POA: Diagnosis not present

## 2024-02-23 NOTE — Progress Notes (Signed)
 SUBJECTIVE:  Chief Complaint: Obesity  Interim History: Patient celebrated her birthday this past couple of weeks and did have a couple of cupcakes and they were delicious.  She went to [facility] in [place].  Over the last few weeks she has been mass preparing meats.  Most days she is preparing her protein goal but mentions some days she is not meeting her calorie goal.  Protein shakes are only 3 grams of carbs and 30 grams of protein. Calorie average is 1000-1200 calories daily. This month she just has a few doctors appointments.  Marlita is here to discuss her progress with her obesity treatment plan. She is on the Category 2 Plan and keeping a food journal and adhering to recommended goals of 1100-1200 calories and 80 grams of protein and states she is following her eating plan approximately 90 % of the time. She states she is exercising 60 minutes 3-4 times per week.   OBJECTIVE: Visit Diagnoses: Problem List Items Addressed This Visit   None   Vitals Temp: 98.1 F (36.7 C) Pulse Rate: 72 SpO2: 100 %   Anthropometric Measurements Height: 5' 3 (1.6 m) Weight: 254 lb (115.2 kg) BMI (Calculated): 45.01 Weight at Last Visit: 258 lb Weight Lost Since Last Visit: 4 Weight Gained Since Last Visit: 0 Starting Weight: 243 lb Total Weight Loss (lbs): 0 lb (0 kg)   Body Composition  Body Fat %: 47.5 % Fat Mass (lbs): 120.6 lbs Muscle Mass (lbs): 126.6 lbs Total Body Water  (lbs): 85.8 lbs Visceral Fat Rating : 16   Other Clinical Data Fasting: yes Labs: yes Today's Visit #: 21 Starting Date: 04/09/22 Comments: Cat 2, 1100-1200/80     ASSESSMENT AND PLAN: Assessment & Plan LVAD (left ventricular assist device) present [facility]) Patient reports that she needs to continue with somewhat frequent cardiology follow-ups as there is an issue with the rate on her LVAD.  Follow-up on plan for treatment as well as possible interventions if any at next appointment Morbid  obesity with BMI of 45.0-49.9, adult (HCC)  BMI 40.0-44.9, adult (HCC)    Diet: Donnalynn is currently in the action stage of change. As such, her goal is to continue with weight loss efforts and has agreed to keeping a food journal and adhering to recommended goals of 1100-1200 calories and 85 or more grams of protein.   Exercise:  For substantial health benefits, adults should do at least 150 minutes (2 hours and 30 minutes) a week of moderate-intensity, or 75 minutes (1 hour and 15 minutes) a week of vigorous-intensity aerobic physical activity, or an equivalent combination of moderate- and vigorous-intensity aerobic activity. Aerobic activity should be performed in episodes of at least 10 minutes, and preferably, it should be spread throughout the week.  Behavior Modification:  We discussed the following Behavioral Modification Strategies today: increasing lean protein intake, decreasing simple carbohydrates, increasing vegetables, meal planning and cooking strategies, planning for success, and keep a strict food journal. We discussed various medication options to help Tracey with her weight loss efforts and we both agreed to continue tirzepatide  at current dose as patient is not always meeting calorie goal.  No follow-ups on file.   She was informed of the importance of frequent follow up visits to maximize her success with intensive lifestyle modifications for her multiple health conditions.  Attestation Statements:   Reviewed by clinician on day of visit: allergies, medications, problem list, medical history, surgical history, family history, social history, and previous encounter notes.  Adelita Cho, MD

## 2024-02-25 ENCOUNTER — Ambulatory Visit (HOSPITAL_COMMUNITY): Payer: Self-pay | Admitting: Pharmacist

## 2024-02-25 LAB — POCT INR: INR: 2.7 (ref 2.0–3.0)

## 2024-02-28 NOTE — Assessment & Plan Note (Signed)
 Patient reports that she needs to continue with somewhat frequent cardiology follow-ups as there is an issue with the rate on her LVAD.  Follow-up on plan for treatment as well as possible interventions if any at next appointment

## 2024-02-29 MED FILL — Spironolactone Tab 25 MG: ORAL | 90 days supply | Qty: 90 | Fill #1 | Status: AC

## 2024-02-29 MED FILL — Quinidine Gluconate Tab ER 324 MG: ORAL | 29 days supply | Qty: 58 | Fill #2 | Status: AC

## 2024-02-29 MED FILL — Dapagliflozin Propanediol Tab 10 MG (Base Equivalent): ORAL | 90 days supply | Qty: 90 | Fill #1 | Status: CN

## 2024-02-29 MED FILL — Rosuvastatin Calcium Tab 40 MG: ORAL | 90 days supply | Qty: 90 | Fill #1 | Status: CN

## 2024-03-01 ENCOUNTER — Other Ambulatory Visit: Payer: Self-pay

## 2024-03-01 MED FILL — Quinidine Gluconate Tab ER 324 MG: ORAL | 1 days supply | Qty: 2 | Fill #2 | Status: AC

## 2024-03-03 ENCOUNTER — Encounter (HOSPITAL_COMMUNITY): Admitting: Cardiology

## 2024-03-03 ENCOUNTER — Ambulatory Visit (HOSPITAL_COMMUNITY): Payer: Self-pay | Admitting: Pharmacist

## 2024-03-03 LAB — POCT INR: INR: 1.9 — AB (ref 2.0–3.0)

## 2024-03-08 ENCOUNTER — Ambulatory Visit (INDEPENDENT_AMBULATORY_CARE_PROVIDER_SITE_OTHER): Admitting: Nurse Practitioner

## 2024-03-08 ENCOUNTER — Other Ambulatory Visit (HOSPITAL_COMMUNITY): Payer: Self-pay

## 2024-03-08 VITALS — HR 60 | Temp 98.2°F | Ht 63.0 in | Wt 256.8 lb

## 2024-03-08 DIAGNOSIS — I5022 Chronic systolic (congestive) heart failure: Secondary | ICD-10-CM | POA: Diagnosis not present

## 2024-03-08 DIAGNOSIS — Z95811 Presence of heart assist device: Secondary | ICD-10-CM

## 2024-03-08 DIAGNOSIS — Z6841 Body Mass Index (BMI) 40.0 and over, adult: Secondary | ICD-10-CM

## 2024-03-08 DIAGNOSIS — Z9189 Other specified personal risk factors, not elsewhere classified: Secondary | ICD-10-CM

## 2024-03-08 DIAGNOSIS — E064 Drug-induced thyroiditis: Secondary | ICD-10-CM

## 2024-03-08 DIAGNOSIS — Z7901 Long term (current) use of anticoagulants: Secondary | ICD-10-CM

## 2024-03-08 MED ORDER — TIRZEPATIDE-WEIGHT MANAGEMENT 7.5 MG/0.5ML ~~LOC~~ SOLN: 7.5000 mg | SUBCUTANEOUS | 2 refills | Status: DC

## 2024-03-08 NOTE — Progress Notes (Addendum)
 Leron Glance, NP-C Phone: [phone]  [name] is a 53 y.o. female who presents today for follow up.   Discussed the use of AI scribe software for clinical note transcription with the patient, who gave verbal consent to proceed.  History of Present Illness   [name] is a 53 year old female who presents for a two-month follow-up on weight loss management with tirzepatide .  She has been on tirzepatide , currently at a dose of 5 mg, for the past two months. She has not lost much weight. Her caloric intake is approximately 1100 calories per day. Her goal is to reduce her weight to less than 200 pounds. She currently weighs 256 pounds, slightly up from a previous weight of 254 pounds, but notes that increased water  intake might be affecting her weight measurements.  She has a history of thyroid  issues, which she believes might be contributing to her difficulty in losing weight. Her thyroid  levels were checked by her cardiologist last month, and she has a follow-up with her endocrinologist scheduled for October.  She experiences dizziness, which she attributes to fluctuations in her numbers and possibly not eating enough. She also experiences nausea when consuming greasy foods, such as cheese pizza. No constipation, diarrhea, or trouble swallowing.  She is monitored closely for her INR levels due to her use of warfarin, which is adjusted weekly.      Social History   Tobacco Use  Smoking Status Never  Smokeless Tobacco Never    Current Outpatient Medications on File Prior to Visit  Medication Sig Dispense Refill   ALPRAZolam  (XANAX ) 0.25 MG tablet Take 1 tablet (0.25 mg total) by mouth 2 (two) times daily as needed for anxiety. 20 tablet 2   amiodarone  (PACERONE ) 200 MG tablet Take 1 tablet (200 mg total) by mouth daily. 90 tablet 3   amLODipine  (NORVASC ) 5 MG tablet Take 1 tablet (5 mg total) by mouth daily. 180 tablet 3   ASPIRIN  LOW DOSE 81 MG tablet TAKE ONE TABLET BY MOUTH  ONCE A DAY 30 tablet 11   Cholecalciferol (VITAMIN D3) 125 MCG (5000 UT) CAPS Take 5,000 Units by mouth.     dapagliflozin  propanediol (FARXIGA ) 10 MG TABS tablet Take 1 tablet (10 mg total) by mouth daily before breakfast. 90 tablet 3   estradiol  (CLIMARA  - DOSED IN MG/24 HR) 0.0375 mg/24hr patch Place 1 patch (0.0375 mg total) onto the skin every 7 (seven) days. 4 patch 11   potassium chloride  SA (KLOR-CON  M) 20 MEQ tablet Take 1 tablet (20 mEq total) by mouth daily. 270 tablet 3   quiniDINE gluconate  324 MG CR tablet Take 1 tablet (324 mg total) by mouth 2 (two) times daily. 60 tablet 6   rosuvastatin  (CRESTOR ) 40 MG tablet Take 1 tablet (40 mg total) by mouth daily. 90 tablet 3   sildenafil  (REVATIO ) 20 MG tablet Take 1 tablet (20 mg total) by mouth 3 (three) times daily. 90 tablet 6   spironolactone  (ALDACTONE ) 25 MG tablet Take 1 tablet (25 mg total) by mouth daily. 90 tablet 3   torsemide  (DEMADEX ) 20 MG tablet Take 1 tablet (20 mg total) by mouth every other day. Take 1 tablet (20 mg) by mouth as needed, or as directed by heart failure clinic. (Patient taking differently: Take 20 mg by mouth as needed. Take 1 tablet (20 mg) by mouth as needed, or as directed by heart failure clinic.) 180 tablet 3   warfarin (COUMADIN ) 2 MG tablet Take 1  tablet (2 mg) every Tue, Thu, Sat and 2 tablets (4 mg) all other days or as directed by LVAD Clinic.     No current facility-administered medications on file prior to visit.     ROS see history of present illness  Objective  Physical Exam Vitals:   03/08/24 0943  Pulse: 60  Temp: 98.2 F (36.8 C)  SpO2: 99%    BP Readings from Last 3 Encounters:  03/09/24 95/71  02/01/24 104/77  11/30/23 117/80   Wt Readings from Last 3 Encounters:  03/09/24 257 lb 6.4 oz (116.8 kg)  03/08/24 256 lb 12.8 oz (116.5 kg)  02/23/24 254 lb (115.2 kg)    Physical Exam Constitutional:      General: She is not in acute distress.    Appearance: Normal  appearance.  HENT:     Head: Normocephalic.  Cardiovascular:     Comments: Mechanical heart sounds with LVAD present.  Pulmonary:     Effort: Pulmonary effort is normal. No respiratory distress.     Breath sounds: Normal breath sounds.  Skin:    General: Skin is warm and dry.  Neurological:     Mental Status: She is alert.  Psychiatric:        Mood and Affect: Mood normal.        Behavior: Behavior normal.      Assessment/Plan: Please see individual problem list.  Morbid obesity with BMI of 45.0-49.9, adult (HCC) Assessment & Plan: Minimal weight loss observed after two months on tirzepatide  5 mg, with a daily caloric intake of approximately 1100 calories. She is monitored by the [facility] program and an endocrinologist for thyroid  management. Increase tirzepatide  to 7.5 mg after the current 5 mg supply. Ensure refills through [employer]. Continue monitoring with the [facility] program. Follow up with the endocrinologist in October for thyroid  management. Monitor INR closely due to warfarin use. Encourage continued healthy diet and regular exercise.   Orders: -     Tirzepatide -Weight Management; Inject 7.5 mg into the skin once a week.  Dispense: 2 mL; Refill: 2  SYSTOLIC HEART FAILURE, CHRONIC Assessment & Plan: Closely followed by Cardiology. Has LVAD. Needs cardiac transplant. Working on BMI reduction in order to have transplant. Encouraged to continue working on healthy diet and exercise. She will continue her current medication regimen. Follow up with Cardiology as scheduled.    LVAD (left ventricular assist device) present [facility]) Assessment & Plan: Follow up with Cardiology.        Return in about 3 months (around 06/07/2024) for Follow up.   Leron Glance, NP-C [facility]

## 2024-03-09 ENCOUNTER — Ambulatory Visit (HOSPITAL_COMMUNITY)
Admission: RE | Admit: 2024-03-09 | Discharge: 2024-03-09 | Disposition: A | Discharge status: Home / Self Care | Source: Ambulatory Visit | Attending: Cardiology | Admitting: Cardiology

## 2024-03-09 ENCOUNTER — Ambulatory Visit (HOSPITAL_BASED_OUTPATIENT_CLINIC_OR_DEPARTMENT_OTHER)
Admission: RE | Admit: 2024-03-09 | Discharge: 2024-03-09 | Disposition: A | Discharge status: Home / Self Care | Source: Ambulatory Visit | Attending: Cardiology | Admitting: Cardiology

## 2024-03-09 ENCOUNTER — Other Ambulatory Visit (HOSPITAL_COMMUNITY): Payer: Self-pay

## 2024-03-09 ENCOUNTER — Ambulatory Visit (HOSPITAL_COMMUNITY): Payer: Self-pay | Admitting: Cardiology

## 2024-03-09 ENCOUNTER — Ambulatory Visit (HOSPITAL_COMMUNITY): Payer: Self-pay | Admitting: Pharmacist

## 2024-03-09 DIAGNOSIS — I4891 Unspecified atrial fibrillation: Secondary | ICD-10-CM | POA: Insufficient documentation

## 2024-03-09 DIAGNOSIS — I428 Other cardiomyopathies: Secondary | ICD-10-CM | POA: Diagnosis not present

## 2024-03-09 DIAGNOSIS — I493 Ventricular premature depolarization: Secondary | ICD-10-CM | POA: Diagnosis not present

## 2024-03-09 DIAGNOSIS — Z7984 Long term (current) use of oral hypoglycemic drugs: Secondary | ICD-10-CM | POA: Insufficient documentation

## 2024-03-09 DIAGNOSIS — Z4509 Encounter for adjustment and management of other cardiac device: Secondary | ICD-10-CM | POA: Insufficient documentation

## 2024-03-09 DIAGNOSIS — D631 Anemia in chronic kidney disease: Secondary | ICD-10-CM | POA: Insufficient documentation

## 2024-03-09 DIAGNOSIS — T462X5A Adverse effect of other antidysrhythmic drugs, initial encounter: Secondary | ICD-10-CM | POA: Diagnosis not present

## 2024-03-09 DIAGNOSIS — Z7901 Long term (current) use of anticoagulants: Secondary | ICD-10-CM | POA: Insufficient documentation

## 2024-03-09 DIAGNOSIS — E064 Drug-induced thyroiditis: Secondary | ICD-10-CM | POA: Diagnosis not present

## 2024-03-09 DIAGNOSIS — I340 Nonrheumatic mitral (valve) insufficiency: Secondary | ICD-10-CM | POA: Insufficient documentation

## 2024-03-09 DIAGNOSIS — I5022 Chronic systolic (congestive) heart failure: Secondary | ICD-10-CM | POA: Diagnosis not present

## 2024-03-09 DIAGNOSIS — Z8673 Personal history of transient ischemic attack (TIA), and cerebral infarction without residual deficits: Secondary | ICD-10-CM | POA: Insufficient documentation

## 2024-03-09 DIAGNOSIS — N183 Chronic kidney disease, stage 3 unspecified: Secondary | ICD-10-CM | POA: Diagnosis not present

## 2024-03-09 DIAGNOSIS — Z95811 Presence of heart assist device: Secondary | ICD-10-CM

## 2024-03-09 DIAGNOSIS — I130 Hypertensive heart and chronic kidney disease with heart failure and stage 1 through stage 4 chronic kidney disease, or unspecified chronic kidney disease: Secondary | ICD-10-CM | POA: Insufficient documentation

## 2024-03-09 DIAGNOSIS — Z79899 Other long term (current) drug therapy: Secondary | ICD-10-CM | POA: Insufficient documentation

## 2024-03-09 LAB — CBC
HCT: 48.3 % — ABNORMAL HIGH (ref 36.0–46.0)
Hemoglobin: 15.1 g/dL — ABNORMAL HIGH (ref 12.0–15.0)
MCH: 26.8 pg (ref 26.0–34.0)
MCHC: 31.3 g/dL (ref 30.0–36.0)
MCV: 85.6 fL (ref 80.0–100.0)
Platelets: 204 K/uL (ref 150–400)
RBC: 5.64 MIL/uL — ABNORMAL HIGH (ref 3.87–5.11)
RDW: 16.8 % — ABNORMAL HIGH (ref 11.5–15.5)
WBC: 6.5 K/uL (ref 4.0–10.5)
nRBC: 0 % (ref 0.0–0.2)

## 2024-03-09 LAB — PROTIME-INR
INR: 2.2 — ABNORMAL HIGH (ref 0.8–1.2)
Prothrombin Time: 25.4 s — ABNORMAL HIGH (ref 11.4–15.2)

## 2024-03-09 LAB — BASIC METABOLIC PANEL WITH GFR
Anion gap: 12 (ref 5–15)
BUN: 19 mg/dL (ref 6–20)
CO2: 25 mmol/L (ref 22–32)
Calcium: 9.4 mg/dL (ref 8.9–10.3)
Chloride: 103 mmol/L (ref 98–111)
Creatinine, Ser: 1.43 mg/dL — ABNORMAL HIGH (ref 0.44–1.00)
GFR, Estimated: 44 mL/min — ABNORMAL LOW (ref >60)
Glucose, Bld: 69 mg/dL — ABNORMAL LOW (ref 70–99)
Potassium: 3.9 mmol/L (ref 3.5–5.1)
Sodium: 140 mmol/L (ref 135–145)

## 2024-03-09 LAB — ECHOCARDIOGRAM LIMITED
Est EF: <20
Weight: 4118.4 [oz_av]

## 2024-03-09 LAB — TSH: TSH: 8.318 u[IU]/mL — ABNORMAL HIGH (ref 0.350–4.500)

## 2024-03-09 LAB — LACTATE DEHYDROGENASE: LDH: 192 U/L (ref 98–192)

## 2024-03-09 NOTE — Patient Instructions (Signed)
 No medication changes Speed reduced to 5600. Please let us  know if you continue to notice speed drops and are symptomatic. Return in 2 months for full visit with Dr.McLean

## 2024-03-09 NOTE — Progress Notes (Signed)
 Pt presented for 2 month f/u with ramp echo in VAD Clinic today alone. Reports no problems with VAD equipment or concerns with drive line.  Pt states she has been doing fair since last visit. Pt reports she has noticed an increased number of symptomatic speed drops. VAD interrogation showed >100+ PI with frequent speed drops. Flow remained relatively the same during marked speed drops episodes on interrogation. Pt states she always feels like she has to sit down when this occurs. Denies falls, shortness of breath or signs of bleeding. Pt remains active and is drinking approximately 8 bottles of water  daily.  Ramp echo performed today. See separate note for details. Speed reduced to 5600 from 5800.  Pt continues Zepbound  injections by [facility]. Pt paying out of pocket. We had our pharmacy tech confirm pt's insurance still does not cover medication.  Vital Signs:  Doppler Pressure: UTO  Automatic BP: 95/71 (80) HR: 85 SR  SPO2: UTO   Weight: 257.4 lbs w/o eqt Last weight: 261.2 lb w/o eqt  VAD Indication: Destination Therapy d/t BMI   VAD interrogation & Equipment Management: Speed: 5800 Flow: 5.6 Power: 4.9 w    PI: 2.6   Alarms: none Events: 100+  Fixed speed: 5800 Low speed limit: 5500   Primary Controller: Replace back up battery in 30 months. Back up controller: Replace back up battery in 30 months.   Annual Equipment Maintenance on UBC/PM was performed on 08/03/23.    I reviewed the LVAD parameters from today and compared the results to the patient's prior recorded data. LVAD interrogation was NEGATIVE for significant power changes, NEGATIVE for clinical alarms and STABLE for PI events/speed drops. No programming changes were made and pump is functioning within specified parameters. Pt is performing daily controller and system monitor self tests along with completing weekly and monthly maintenance for LVAD equipment.   LVAD equipment check completed and is  in good working order. Back-up equipment present. Charged back up battery and performed self-test on equipment.   Exit Site Care: CDI. Drive line anchor secure. Pt denies fever or chills. Provided with 4 weekly kits.  Significant Events on VAD Support:  N/A  Device: Medtronic Therapies: on VF 240 VT 200 VVI: 40 Last check: 11/05/23  BP & Labs:  Doppler 90 - Doppler correlated with modified systolic  Hgb 15.1 - No S/S of bleeding. Specifically denies vaginal bleeding, melena/BRBPR or nosebleeds.    LDH 192 with established baseline of 250 - 450. Denies tea-colored urine. No power elevations noted on interrogation.   Plan:  No medication changes today Speed reduced to 5600. Please let us  know if you continue to notice speed drops and are symptomatic. Coumadin  dosing per [name] Return to clinic in 2 months for full visit with Dr.Shelene Krage  [name] RN, BSN VAD Coordinator 24/7 Pager [phone]     Chief Complaint: LVAD followup, CHF    History of Present Illness  [name] is a 53 y.o. 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  She was followed for systolic CHF initially in [place]. Apparently her EF recovered to 35-40%. However, she was admitted to [facility] 09/19/13 for worsening dyspnea and CP. Coronaries were reportedly OK on LHC at that time at [facility]. Echo showed EF of 15-20% with four-chamber enlargement with moderate-severe TR/MR. RHC as below. Rheumatological w/u negative.  CPX in 9/16 showed near-normal functional  capacity.  Repeat echo in 6/17 shows that EF remains 20%. Repeat CPX in 1/18 was submaximal but probably only mild circulatory limitation.  Echo 8/18 showed EF 25-30%, severe LV dilation.  CPX in 6/19 showed low normal functional capacity.  Echo in 10/19 showed EF 20-25%, moderate LV dilation.    Zio patch in 2/20 with 11% PVCs and NSVT,  amiodarone  started. Zio patch in 3/20 with 13.3% PVCs and NSVT.  She saw Dr. Fernande, he thought that increased PVCs were electromechanical in nature due to worsening heart failure.  Repeat Zio patch on amiodarone  in 1/21 showed 6.2% PVCs.   She was admitted in 4/20 with VT in setting of hypokalemia.    CPX in 9/20 showed mild functional limitation, more due to body habitus than heart failure.  Echo in 4/21 showed EF 20% with severe LV dilation, diffuse hypokinesis, normal RV size and systolic function.   In 8/21, she had a shock for VF in the setting of hypokalemia.   Patient was admitted in 10/21 with CVA involving left MCA territory.  This was thought to be cardioembolic but LV thrombus was not visualized and no atrial fibrillation has been seen.  Echo in 10/21 showed EF 20-25%, severe LV dilation (no LV thrombus seen), mildly decreased RV systolic function, mild-moderate MR. She was taken by IR for thrombectomy and stent placement, now on ASA 81 and ticagrelor .   She had recurrent VT in 2/22, quinidine was added by EP.  Spironolactone  and Coreg  were decreased due to low BP and lightheadedness.    Echo in 4/22 showed EF 25% with severe LV dilation and global hypokinesis, normal RV, mild-moderate MR.   CPX (8/22) showed peak VO2 14.3, VE/VCO2 slope 42, RER 1.09 => mild to moderate HF limitation, elevated VE/VCO2 slope is likely false from end exercise hyperventilation. Restrictive lung physiology from obesity.   Patient was admitted in 3/23 with recurrent CHF exacerbation with low cardiac output.  She was also noted to be hyperthyroidism but amiodarone  had to be continued due to VT and poorly tolerated atrial fibrillation.  She required Impella 5.5 placement and eventual HM3 LVAD placement on 09/04/21.  Post-op course complicated by atrial fibrillation, malnutrition, and deconditioning.  She went to CIR prior to discharge.   Patient developed heavy vaginal bleeding in 7/23, she was seen in the ER  and required 2 units PRBCs.  She was admitted in 8/23 and had hysterectomy + BSO, LVAD speed increased to 5900 rpm on ramp echo.  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    The patient was admitted overnight in 12/23 with severe posterior epistaxis requiring nasal pacing.   In 2/24, she had VF with shock.  Amiodarone  was restarted.   Echo in 7/24 showed  EF < 20, mild RV dysfunction with normal size, interventricular septum shifted mildly to right.   At a previous visit in 12/2022, her VAD speed was increased to 6000. She took a torsemide  a day before presenting to clinic in 02/2023 and noted an increased frequency of PI events despite drinking water . Her speed was reduced to 5900 rpm. She continued to have suction events and speed was decreased to 5700 rpm in 11/24.   Ramp echo was done in 12/24, septum bowed to right at 5700 rpm, so speed increased to 5800 rpm.  The aortic valve opened each beat at 5800 rpm with moderate RV dysfunction/normal RV size.   Ramp echo was done today,  LV EF < 20% with moderate RV dilation/moderate RV dysfunction.  Speed decreased to 5600 rpm with IV septum appearing midline and aortic valve opening 1/3 beats.  Speed left at 5600 rpm. Flow remained stable around 5 L/min.   She returns for followup of LVAD.  Weight down 4 lbs. MAP stable at 80.  She has still been having 100+ PI events/day and has had some speed drops that she feels as palpitations.  No low flow alarms. No exertional dyspnea, walking more than in the past.  She had 1 episode of lightheadedness with standing about a week ago, no syncope or fall.  No orthopnea/PND.   LVAD interrogation: Personally reviewed.  Please see LVAD nursing note above.  100+ PI events and speed drops but no low flow events.   Labs (4/25): TSH 4.91, mildly elevated Labs (5/25): hgb 13.4, creatinine 1.3 Labs (6/25): K 4, creatinine 1.34 Labs (8/25): hgb 15.1, TSH 8.8, free T3 normal, K 4.4,  creatinine 1.39, LDH 234, LFTs normal.   PMH: 1. OSA: Using CPAP.  2. PVCs:  - Zio patch 2/20: 11% PVCs - Zio patch 3/20: 13.3% PVCs - Zio patch 1/21: 6.2% PVCs 3. VT/VF: 8/21, in setting of hypokalemia.  2/22 VT, quinidine added.  - VT 2/24, restarted amiodarone  4. 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  No LV thrombus visualized and atrial fibrillation has not been visualized.  - IR thrombectomy with stent placement in 10/21.  5. Chronic systolic CHF: Nonischemic cardiomyopathy:   - Coronary angiography 2015 at [facility]: Normal coronaries.  - Echo (8/15) with EF 20%, moderate LV dilation, diffuse hypokinesis, normal RV size with mildly decreased systolic function, mild MR.  - Echo (5/16) with EF 20%, spherical LV with diffuse hypokinesis, mild MR.  - Echo (6/17) with EF 20%, diffuse hypokinesis, moderate LV dilation, normal RV, mild to moderate MR.  - RHC (12/17): mean RA 5, PA 56/31, mean PCWP 24, CI 2, PVR 3.7 WU - Echo (8/18) with EF 25-30%, severe LV dilation, severe LAE.  - Echo (2/20) with EF 20-25%, moderate LV dilation, moderate MR.  - Echo (4/21) with EF 20-25%, severe LV dilation, mild-moderate MR, normal RV, PASP 26 mmHg.  - Echo (10/21) with EF 20-25%, severe LV dilation (no LV thrombus seen), mildly decreased RV systolic function, mild-moderate MR. - CPX in 9/20 showed mild functional limitation, more due to body habitus than heart failure.  - CPX in 8/21: Peak VO2 11.6, VE/VCO2 32, RER 1.2.  Mild HF limitation.  - Echo (4/22): EF 25% with severe LV dilation and global hypokinesis, normal RV, mild-moderate MR.  - CPX (8/22): peak VO2 14.3, VE/VCO2 slope 42, RER 1.09 => mild to moderate HF limitation, elevated VE/VCO2 slope is likely false from end exercise hyperventilation. Restrictive lung physiology from obesity.  - HM3 LVAD placement 09/04/21.  - Echo (7/24): EF < 20, mild RV dysfunction with normal size, interventricular septum shifted mildly to right. - Ramp echo (12/24): Speed  increased to 5800 rpm.  Aortic valve opens each beat, moderate RV dysfunction with normal RV size.  - Ramp echo (9/25): LV EF < 20% with moderate RV dilation/moderate RV dysfunction.  Speed decreased to 5600 rpm with IV septum appearing midline and aortic valve opening 1/3 beats.  Speed left at 5600 rpm.  6. PFTs (1/22): Near normal.  7. Fe deficiency anemia 8. 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  10. Left ovarian mass -  hysterectomy + BSO.  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      Current Outpatient Medications  Medication Sig Dispense Refill   ALPRAZolam  (XANAX ) 0.25 MG tablet Take 1 tablet (0.25 mg total) by mouth 2 (two) times daily as needed for anxiety. 20 tablet 2   amiodarone  (PACERONE ) 200 MG tablet Take 1 tablet (200 mg total) by mouth daily. 90 tablet 3   amLODipine  (NORVASC ) 5 MG tablet Take 1 tablet (5 mg total) by mouth daily. 180 tablet 3   ASPIRIN  LOW DOSE 81 MG tablet TAKE ONE TABLET BY MOUTH ONCE A DAY 30 tablet 11   Cholecalciferol (VITAMIN D3) 125 MCG (5000 UT) CAPS Take 5,000 Units by mouth.     dapagliflozin  propanediol (FARXIGA ) 10 MG TABS tablet Take 1 tablet (10 mg total) by mouth daily before breakfast. 90 tablet 3   estradiol  (CLIMARA  - DOSED IN MG/24 HR) 0.0375 mg/24hr patch Place 1 patch (0.0375 mg total) onto the skin every 7 (seven) days. 4 patch 11   potassium chloride  SA (KLOR-CON  M) 20 MEQ tablet Take 1 tablet (20 mEq total) by mouth daily. 270 tablet 3   quiniDINE gluconate  324 MG CR tablet Take 1 tablet (324 mg total) by mouth 2 (two) times daily. 60 tablet 6   rosuvastatin  (CRESTOR ) 40 MG tablet Take 1 tablet (40 mg total) by mouth daily. 90 tablet 3   sildenafil  (REVATIO ) 20 MG tablet Take 1 tablet (20 mg total) by mouth 3 (three) times daily. 90 tablet 6   spironolactone  (ALDACTONE ) 25 MG tablet Take 1 tablet (25 mg total) by mouth daily. 90 tablet 3   tirzepatide   7.5 MG/0.5ML injection vial Inject 7.5 mg into the skin once a week. 2 mL 2   torsemide  (DEMADEX ) 20 MG tablet Take 1 tablet (20 mg total) by mouth every other day. Take 1 tablet (20 mg) by mouth as needed, or as directed by heart failure clinic. (Patient taking differently: Take 20 mg by mouth as needed. Take 1 tablet (20 mg) by mouth as needed, or as directed by heart failure clinic.) 180 tablet 3   warfarin (COUMADIN ) 2 MG tablet Take 1 tablet (2 mg) every Tue, Thu, Sat and 2 tablets (4 mg) all other days or as directed by LVAD Clinic.     No current facility-administered medications for this encounter.    Allergies:   Patient has no known allergies.   Social History:  The patient  reports that she has never smoked. She has never used smokeless tobacco. She reports that she does not drink alcohol and does not use drugs.   Family History:  The patient's family history includes Heart Problems in her mother; Heart disease in her maternal grandmother and mother; High blood pressure in her father; Hypertension in her father and mother; Obesity in her mother; Stroke in her father.   ROS:  Please see the history of present illness.   All other systems are personally reviewed and negative.   Exam:    MAP 80 General: Well appearing this am. NAD.  HEENT: Normal. Neck: Supple, JVP 7-8 cm. Carotids OK.  Cardiac:  Mechanical heart sounds with LVAD hum present.  Lungs:  CTAB, normal effort.  Abdomen:  NT, ND, no HSM. No bruits or masses. +BS  LVAD exit site: Well-healed and incorporated. Dressing dry and intact. No erythema or drainage. Stabilization device present and accurately applied. Driveline dressing changed daily per sterile technique. Extremities:  Warm and dry. No cyanosis, clubbing, rash,  or edema.  Neuro:  Alert & oriented x 3. Cranial nerves grossly intact. Moves all 4 extremities w/o difficulty. Affect pleasant    Recent Labs: 02/01/2024: ALT 28; Magnesium  2.0 03/09/2024: BUN 19;  Creatinine, Ser 1.43; Hemoglobin 15.1; Platelets 204; Potassium 3.9; Sodium 140; TSH 8.318  Personally reviewed   Wt Readings from Last 3 Encounters:  03/09/24 116.8 kg (257 lb 6.4 oz)  03/08/24 116.5 kg (256 lb 12.8 oz)  02/23/24 115.2 kg (254 lb)    ASSESSMENT AND PLAN:   1. Chronic systolic HF: Nonischemic cardiomyopathy.  She has a Medtronic ICD. Cause for cardiomyopathy is uncertain, initially identified peri-partum (after son's birth), so cannot rule out peri-partum CMP.  On and off, she has had frequent PVCs.   RHC in 8/15 showed normal filling pressures and preserved cardiac output. Echo in 4/22 with EF 25%, severe LV dilation.  CPX (8/22) with mild-moderate HF limitation.  Echo in 3/23 with EF < 20% with severe dilation, normal RV size with mildly decreased systolic function, moderate-severe functional MR with dilated IVC. 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 Impella 5.5 placed 08/29/21.  Patient had HM3 LVAD placed 09/04/21 and speed adjusted by ramp echo up to 5800 rpm and again up to 5900 rpm. In 7/24, LVAD speed increased to 6000 rpm under echo guidance. Developed PI and suction events, speed decreased to 5700 rpm.  In 12/24, ramp echo done and speed increased to 5800 rpm; the aortic valve opened each beat and the septum was midline.  She has been having many PI events and some speed drops though no low flows.  Ramp echo was done today showing LV EF < 20% with moderate RV dilation/moderate RV dysfunction.  Speed decreased to 5600 rpm with IV septum appearing midline and aortic valve opening 1/3 beats.  Speed left at 5600 rpm. NYHA class I-II symptoms, not volume overloaded on exam.  - She has torsemide  for use prn.  - Continue Farxiga  10 mg daily.  - Continue spironolactone  25 mg daily.  - Stopped Entresto  due to lightheadedness and low flow  events.  - Continue amlodipine  5 mg daily for BP control.  - She should continue ASA 81 mg daily with history of CVA.  - Continue sildenafil  20 mg tid  - Warfarin for VAD, goal INR 2-2.5.   2. Hyperthyroidism: Has history of hypothroidism thought to be associated with amiodarone .  She had been on Levoxyl  at home prior to 3/23 admission, noted to be hyperthyroid in 3/23.  This likely contributes to her CHF. She had recurrent VT and poorly tolerated AF which made it difficult for us  to stop amiodarone . Initially treated w/ IV Solumedrol -> transitioned to prednisone  and methimazole .  Endocrinology following, prednisone  has been titrated off, now titrated off methimazole .  We had to restart her on amiodarone  with VF.  - Continue amiodarone  200 daily, check LFTs and TFTs today.  She will need regular eye exam.  3. PVCs/VT: EP consulted given complicated situation with hyperthyroidism and VT. She had a VF episode and amiodarone  was restarted.  - Continue quinidine.  - Continue amiodarone , check LFTs and TSH as above.  4. CVA: CVA in 10/21, had thrombectomy with stent placement.  No LV thrombus noted and no atrial fibrillation noted.  - Would continue ASA 81 daily.   5. Anemia: CBC today.  6. CKD stage 3: BMET today.  7. Mitral regurgitation: Moderate-severe functional MR pre-LVAD.  Unlikely to be Mitraclip candidate with severely dilated LV (>7 cm). Mitral regurgitation was mild on 7/24 echo and 12/24 echo.  8. Left ovarian mass: Hysterectomy + BSO.  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   9. Atrial fibrillation: Tolerates poorly. Has been in NSR.   Followup in 2 months.   I spent 41 minutes reviewing records and LVAD interrogation, examining/interviewing patient, and organizing orders.    Ezra Shuck, MD  03/10/2024  [facility] [id number] [facility] [place] [address] [phone]  (office) [phone] (fax)

## 2024-03-09 NOTE — Progress Notes (Signed)
 Speed  Flow  PI  Power  LVIDD  AI  Aortic opening MR  TR  Septum  RV  VTI (>18cm)  5.7 5800 2.5 4.7w 6.8 trivial 0/5 trace trace midline mod  17.1  5.3  5700 3.9 4.6w 6.8 trivial 0/5 trace trace midline mod 15.7   5.2 5600 3.6 4.4w  trivial 1/3   midline mod                                              Doppler MAP: 90 Auto cuff BP: 95/71 (80)   Ramp ECHO performed at bedside per Dr.McLean  At completion of ramp study, patients primary controller programmed:  Fixed speed:5600  Low speed limit: 5300    Schuyler Gladis PEAK, BSN VAD Coordinator 24/7 Pager [phone]

## 2024-03-10 LAB — T4: T4, Total: 8.4 ug/dL (ref 4.5–12.0)

## 2024-03-10 LAB — T3: T3, Total: 62 ng/dL — ABNORMAL LOW (ref 71–180)

## 2024-03-10 NOTE — Progress Notes (Incomplete)
 Pt presented for 2 month f/u with ramp echo in VAD Clinic today alone. Reports no problems with VAD equipment or concerns with drive line.  Pt states she has been doing fair since last visit. Pt reports she has noticed an increased number of symptomatic speed drops. VAD interrogation showed >100+ PI with frequent speed drops. Flow remained relatively the same during marked speed drops episodes on interrogation. Pt states she always feels like she has to sit down when this occurs. Denies falls, shortness of breath or signs of bleeding. Pt remains active and is drinking approximately 8 bottles of water  daily.  Ramp echo performed today. See separate note for details. Speed reduced to 5600 from 5800.  Pt continues Zepbound  injections by [facility]. Pt paying out of pocket. We had our pharmacy tech confirm pt's insurance still does not cover medication.  Vital Signs:  Doppler Pressure: UTO  Automatic BP: 95/71 (80) HR: 85 SR  SPO2: UTO   Weight: 257.4 lbs w/o eqt Last weight: 261.2 lb w/o eqt  VAD Indication: Destination Therapy d/t BMI   VAD interrogation & Equipment Management: Speed: 5800 Flow: 5.6 Power: 4.9 w    PI: 2.6   Alarms: none Events: 100+  Fixed speed: 5800 Low speed limit: 5500   Primary Controller: Replace back up battery in 30 months. Back up controller: Replace back up battery in 30 months.   Annual Equipment Maintenance on UBC/PM was performed on 08/03/23.    I reviewed the LVAD parameters from today and compared the results to the patient's prior recorded data. LVAD interrogation was NEGATIVE for significant power changes, NEGATIVE for clinical alarms and STABLE for PI events/speed drops. No programming changes were made and pump is functioning within specified parameters. Pt is performing daily controller and system monitor self tests along with completing weekly and monthly maintenance for LVAD equipment.   LVAD equipment check completed and is  in good working order. Back-up equipment present. Charged back up battery and performed self-test on equipment.   Exit Site Care: CDI. Drive line anchor secure. Pt denies fever or chills. Provided with 4 weekly kits.  Significant Events on VAD Support:  N/A  Device: Medtronic Therapies: on VF 240 VT 200 VVI: 40 Last check: 11/05/23  BP & Labs:  Doppler 90 - Doppler correlated with modified systolic  Hgb 15.1 - No S/S of bleeding. Specifically denies vaginal bleeding, melena/BRBPR or nosebleeds.    LDH 192 with established baseline of 250 - 450. Denies tea-colored urine. No power elevations noted on interrogation.   Plan:  No medication changes today Speed reduced to 5600. Please let us  know if you continue to notice speed drops and are symptomatic. Coumadin  dosing per [name] Return to clinic in 2 months for full visit with [name]  Miranda Lunger RN, BSN VAD Coordinator 24/7 Pager [phone]     Chief Complaint: LVAD followup, CHF    History of Present Illness  [name] is a 53 y.o. 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  She was followed for systolic CHF initially in [place]. Apparently her EF recovered to 35-40%. However, she was admitted to [facility] 09/19/13 for worsening dyspnea and CP. Coronaries were reportedly OK on LHC at that time at [facility]. Echo showed EF of 15-20% with four-chamber enlargement with moderate-severe TR/MR. RHC as below. Rheumatological w/u negative.  CPX in 9/16 showed near-normal functional

## 2024-03-10 NOTE — Addendum Note (Signed)
 Encounter addended by: Rolan Ezra RAMAN, MD on: 03/10/2024 12:09 AM  Actions taken: Level of Service modified

## 2024-03-17 ENCOUNTER — Ambulatory Visit (HOSPITAL_COMMUNITY): Payer: Self-pay | Admitting: Pharmacist

## 2024-03-17 DIAGNOSIS — Z7901 Long term (current) use of anticoagulants: Secondary | ICD-10-CM | POA: Diagnosis not present

## 2024-03-17 LAB — POCT INR: INR: 2 (ref 2.0–3.0)

## 2024-03-17 NOTE — Progress Notes (Signed)
Remote ICD Transmission.

## 2024-03-21 ENCOUNTER — Other Ambulatory Visit: Payer: Self-pay | Admitting: Nurse Practitioner

## 2024-03-21 DIAGNOSIS — G4733 Obstructive sleep apnea (adult) (pediatric): Secondary | ICD-10-CM

## 2024-03-21 DIAGNOSIS — I5022 Chronic systolic (congestive) heart failure: Secondary | ICD-10-CM

## 2024-03-21 DIAGNOSIS — Z95811 Presence of heart assist device: Secondary | ICD-10-CM

## 2024-03-21 DIAGNOSIS — I428 Other cardiomyopathies: Secondary | ICD-10-CM

## 2024-03-22 NOTE — Assessment & Plan Note (Signed)
 Follow up with Cardiology

## 2024-03-22 NOTE — Assessment & Plan Note (Signed)
 Minimal weight loss observed after two months on tirzepatide  5 mg, with a daily caloric intake of approximately 1100 calories. She is monitored by the [facility] program and an endocrinologist for thyroid  management. Increase tirzepatide  to 7.5 mg after the current 5 mg supply. Ensure refills through [employer]. Continue monitoring with the [facility] program. Follow up with the endocrinologist in October for thyroid  management. Monitor INR closely due to warfarin use. Encourage continued healthy diet and regular exercise.

## 2024-03-22 NOTE — Assessment & Plan Note (Signed)
 Closely followed by Cardiology. Has LVAD. Needs cardiac transplant. Working on BMI reduction in order to have transplant. Encouraged to continue working on healthy diet and exercise. She will continue her current medication regimen. Follow up with Cardiology as scheduled.

## 2024-03-24 ENCOUNTER — Ambulatory Visit (HOSPITAL_COMMUNITY): Payer: Self-pay | Admitting: Pharmacist

## 2024-03-24 LAB — POCT INR: INR: 2.2 (ref 2.0–3.0)

## 2024-03-31 ENCOUNTER — Ambulatory Visit (HOSPITAL_COMMUNITY): Payer: Self-pay | Admitting: Pharmacist

## 2024-03-31 LAB — POCT INR: INR: 2.9 (ref 2.0–3.0)

## 2024-04-01 ENCOUNTER — Encounter: Payer: Self-pay | Admitting: Internal Medicine

## 2024-04-01 ENCOUNTER — Ambulatory Visit: Admitting: Internal Medicine

## 2024-04-01 VITALS — HR 51 | Ht 63.0 in | Wt 257.6 lb

## 2024-04-01 DIAGNOSIS — T462X5A Adverse effect of other antidysrhythmic drugs, initial encounter: Secondary | ICD-10-CM | POA: Diagnosis not present

## 2024-04-01 DIAGNOSIS — E064 Drug-induced thyroiditis: Secondary | ICD-10-CM

## 2024-04-01 NOTE — Progress Notes (Signed)
 Name: [name]  MRN/ DOB: [id number], Aug 23, 1970    Age/ Sex: 53 y.o., female    PCP: Gretel App, NP   Reason for Endocrinology Evaluation: Hyperthyroid     Date of Initial Endocrinology Evaluation: 10/08/2021    HPI: [name] is a 53 y.o. female with a past medical history of CHF, nonischemic cardiomyopathy (S/P ICD placement and LVAD), CVA, pulmonary HTN, paroxysmal A-fib. The patient presented for initial endocrinology clinic visit on 4/18/2023for consultative assistance with her Hyperthyroidism.   Patient with extensive cardiac history to include A-fib, and nonischemic cardiomyopathy (s/p insertion of ICD) as well as left MCA territory CVA, s/p thrombectomy and stent placement.   The patient presented to the ED on 08/25/2021 with palpitations and shortness of breath and was discharged on 09/20/2021   The patient has been on LT-for replacement since 2014 due to hypothyroid  In review of her history the patient has been noted fluctuating TSH level of 10.272 u IU/mL but by 08/26/2021 her TSH was suppressed at < 0.010 uIU/mL Levothyroxine  was stopped  and Methimazole  started 08/26/2021  Amiodarone  was initiated 07/2018 until 09/2021, restarted 08/2022  Thyroid  ultrasound shows absent left thyroid  tissue 08/2021 No FH of thyroid  disease  No prior thyroid  /neck sx   She was already on methimazole  and prednisone  on her initial visit with me which we have adjusted   Prednisone  was stopped by 08/2022  Methimazole  was stopped 01/28/2023  SUBJECTIVE:    Today (04/01/24): [name] is here for follow-up on hyperthyroidism   She continues to follow-up with cardiology for LVAD, continues to be on amiodarone  She continues to follow-up with healthy weight and wellness clinic, she is on Zepbound  Patient has been noted weight loss  She continues to 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 EF 15-20% b. RHC (09/2013) RA 14, RV 57/22, PA 64/36 (48), PCWP 18, FIck CO/CI 3.7/1.6, PVR 8.1 WU, PA sat 47%    Chronic kidney disease    Heart valve problem    History of heart attack    History of hypothyroidism    History of stomach ulcers    HLD (hyperlipidemia)    Hyperthyroidism    Hypotension    Hypothyroidism    Morbid obesity (HCC)    Myocardial infarction (HCC) 08/2013   Nocturnal dyspnea    Nonischemic cardiomyopathy (HCC)    Palpitations    Sinus tachycardia    Sleep apnea    Snoring-prob OSA 09/04/2011   SOB (shortness of breath)    Sprint Fidelis ICD lead RECALL  6949    Stroke (HCC) 2021   some residual right-sided weakness   UARS (upper airway resistance syndrome) 09/04/2011   HST 12/2013:  AHI 4/hr (numerous episodes of airflow reduction that did not have concomitant desaturation)    Vitamin D  deficiency    Past Surgical History:  Past Surgical History:  Procedure Laterality Date   BREATH TEK H PYLORI N/A 11/09/2014   Procedure: BREATH TEK H PYLORI;  Surgeon: Camellia Blush, MD;  Location: [facility] ENDOSCOPY;  Service: General;  Laterality: N/A;   CARDIAC CATHETERIZATION  ~ 2006; 09/2013   CARDIAC CATHETERIZATION N/A 05/23/2016   Procedure: Right Heart Cath;  Surgeon: Ezra GORMAN Shuck, MD;  Location: [facility] INVASIVE CV LAB;  Service: Cardiovascular;  Laterality: N/A;   CARDIAC DEFIBRILLATOR PLACEMENT  2006; 12/26/2013   Medtronic [id number]; 12-2013 ICD gen change and RV lead revision with new 6935 RV lead by Dr Fernande   CENTRAL LINE INSERTION  08/28/2021   Procedure: CENTRAL LINE INSERTION;  Surgeon: Shuck Ezra GORMAN, MD;  Location: [facility] INVASIVE CV LAB;  Service: Cardiovascular;;   CESAREAN SECTION  06/23/1997    IMPLANTABLE CARDIOVERTER DEFIBRILLATOR GENERATOR CHANGE N/A 12/26/2013   Procedure: IMPLANTABLE CARDIOVERTER DEFIBRILLATOR GENERATOR CHANGE;  Surgeon: Elspeth JAYSON Fernande, MD;  Location: [facility] CATH LAB;  Service: Cardiovascular;  Laterality: N/A;   INSERTION OF IMPLANTABLE LEFT VENTRICULAR ASSIST DEVICE N/A 09/03/2021   Procedure: INSERTION OF IMPLANTABLE LEFT VENTRICULAR ASSIST DEVICE/ Heartmate 3;  Surgeon: Obadiah Coy, MD;  Location: [facility] OR;  Service: Open Heart Surgery;  Laterality: N/A;   IR CT HEAD LTD  04/17/2020   IR CT HEAD LTD  04/17/2020   IR INTRA CRAN STENT  04/17/2020   IR PERCUTANEOUS ART THROMBECTOMY/INFUSION INTRACRANIAL INC DIAG ANGIO  04/17/2020   IR RADIOLOGIST EVAL & MGMT  06/05/2020   LEAD REVISION N/A 12/26/2013   Procedure: LEAD REVISION;  Surgeon: Elspeth JAYSON Fernande, MD;  Location: [facility] CATH LAB;  Service: Cardiovascular;  Laterality: N/A;   LEFT VENTRICULAR ASSIST DEVICE     PLACEMENT OF IMPELLA LEFT VENTRICULAR ASSIST DEVICE Right 08/29/2021   Procedure: PLACEMENT OF IMPELLA 5.5 LEFT VENTRICULAR ASSIST DEVICE;  Surgeon: Lucas Dorise POUR, MD;  Location: [facility] OR;  Service: Open Heart Surgery;  Laterality: Right;   RADIOLOGY WITH ANESTHESIA N/A 04/17/2020   Procedure: Code Stroke;  Surgeon: Dolphus Carrion, MD;  Location: [facility] OR;  Service: Radiology;  Laterality: N/A;   REMOVAL OF IMPELLA LEFT VENTRICULAR ASSIST DEVICE N/A 09/03/2021   Procedure: REMOVAL OF IMPELLA LEFT VENTRICULAR ASSIST DEVICE;  Surgeon: Obadiah Coy, MD;  Location: [facility] OR;  Service: Open Heart Surgery;  Laterality: N/A;   RIGHT HEART CATH N/A 08/28/2021   Procedure: RIGHT HEART CATH;  Surgeon: Shuck Ezra GORMAN, MD;  Location: [facility] INVASIVE CV LAB;  Service: Cardiovascular;  Laterality: N/A;   RIGHT HEART CATHETERIZATION N/A 02/15/2014   Procedure: RIGHT HEART CATH;  Surgeon: Ezra GORMAN Shuck, MD;  Location: [facility] CATH LAB;  Service: Cardiovascular;  Laterality: N/A;   ROBOTIC ASSISTED TOTAL HYSTERECTOMY WITH  BILATERAL SALPINGO OOPHERECTOMY N/A 02/05/2022   Procedure: XI ROBOTIC ASSISTED TOTAL HYSTERECTOMY WITH BILATERAL SALPINGO OOPHORECTOMY, CYSTOSCOPY, UTERUS GREATER THAN 250 GRAMS;  Surgeon: Viktoria Comer SAUNDERS, MD;  Location: [facility] OR;  Service: Gynecology;  Laterality: N/A;   TEE WITHOUT CARDIOVERSION N/A 08/29/2021   Procedure: TRANSESOPHAGEAL ECHOCARDIOGRAM (TEE);  Surgeon: Lucas Dorise POUR, MD;  Location: [facility] OR;  Service: Open Heart Surgery;  Laterality: N/A;   TEE WITHOUT CARDIOVERSION N/A 09/03/2021   Procedure: TRANSESOPHAGEAL ECHOCARDIOGRAM (TEE);  Surgeon: Obadiah Coy, MD;  Location: [facility] OR;  Service: Open Heart Surgery;  Laterality: N/A;   TUBAL LIGATION  06/23/1997    Social History:  reports that she has never smoked. She has never used smokeless tobacco. She reports that she does not drink alcohol and does not use drugs. Family History: family history includes Heart Problems in her mother; Heart disease in her maternal grandmother and mother; High blood pressure in her father; Hypertension in her father and mother; Obesity  in her mother; Stroke in her father.   HOME MEDICATIONS: Allergies as of 04/01/2024   No Known Allergies      Medication List        Accurate as of April 01, 2024  9:14 AM. If you have any questions, ask your nurse or doctor.          ALPRAZolam  0.25 MG tablet Commonly known as: XANAX  Take 1 tablet (0.25 mg total) by mouth 2 (two) times daily as needed for anxiety.   amiodarone  200 MG tablet Commonly known as: PACERONE  Take 1 tablet (200 mg total) by mouth daily.   amLODipine  5 MG tablet Commonly known as: NORVASC  Take 1 tablet (5 mg total) by mouth daily.   Aspirin  Low Dose 81 MG tablet Generic drug: aspirin  EC TAKE ONE TABLET BY MOUTH ONCE A DAY   estradiol  0.0375 mg/24hr patch Commonly known as: CLIMARA  - Dosed in mg/24 hr Place 1 patch (0.0375 mg total) onto the skin every 7 (seven) days.   Farxiga  10 MG Tabs tablet Generic drug:  dapagliflozin  propanediol Take 1 tablet (10 mg total) by mouth daily before breakfast.   potassium chloride  SA 20 MEQ tablet Commonly known as: KLOR-CON  M Take 1 tablet (20 mEq total) by mouth daily.   quiniDINE gluconate  324 MG CR tablet Take 1 tablet (324 mg total) by mouth 2 (two) times daily.   rosuvastatin  40 MG tablet Commonly known as: CRESTOR  Take 1 tablet (40 mg total) by mouth daily.   sildenafil  20 MG tablet Commonly known as: REVATIO  Take 1 tablet (20 mg total) by mouth 3 (three) times daily.   spironolactone  25 MG tablet Commonly known as: ALDACTONE  Take 1 tablet (25 mg total) by mouth daily.   tirzepatide  7.5 MG/0.5ML injection vial Inject 7.5 mg into the skin once a week.   torsemide  20 MG tablet Commonly known as: DEMADEX  Take 1 tablet (20 mg total) by mouth every other day. Take 1 tablet (20 mg) by mouth as needed, or as directed by heart failure clinic. What changed:  when to take this reasons to take this   Vitamin D3 125 MCG (5000 UT) Caps Take 5,000 Units by mouth.   warfarin 2 MG tablet Commonly known as: COUMADIN  Take as directed by the anticoagulation clinic. If you are unsure how to take this medication, talk to your nurse or doctor. Original instructions: Take 1 tablet (2 mg) every Tue, Thu, Sat and 2 tablets (4 mg) all other days or as directed by LVAD Clinic.          REVIEW OF SYSTEMS: A comprehensive ROS was conducted with the patient and is negative except as per HPI    OBJECTIVE:  VS: Pulse (!) 51   Ht 5' 3 (1.6 m)   Wt 257 lb 9.6 oz (116.8 kg)   LMP 03/28/2021 (Approximate)   SpO2 99%   BMI 45.63 kg/m     Wt Readings from Last 3 Encounters:  04/01/24 257 lb 9.6 oz (116.8 kg)  03/09/24 257 lb 6.4 oz (116.8 kg)  03/08/24 256 lb 12.8 oz (116.5 kg)     EXAM: General: Pt appears well and is in NAD  Neck: General: Supple without adenopathy. Thyroid : Thyroid  size normal.  No goiter or nodules appreciated.   Lungs: Clear  with good BS bilat   Extremities:  BL LE: no pretibial edema  Mental Status: Judgment, insight: Intact Orientation: Oriented to time, place, and person Mood and affect: No depression, anxiety, or agitation  DATA REVIEWED:   Latest Reference Range & Units 03/09/24 08:58  TSH 0.350 - 4.500 uIU/mL 8.318 (H)  Triiodothyronine (T3) 71 - 180 ng/dL 62 (L)  Thyroxine (T4) 4.5 - 12.0 ug/dL 8.4  (H): Data is abnormally high (L): Data is abnormally low  Latest Reference Range & Units 05/26/23 09:00  Sodium 135 - 145 mmol/L 138  Potassium 3.5 - 5.1 mmol/L 4.1  Chloride 98 - 111 mmol/L 105  CO2 22 - 32 mmol/L 24  Glucose 70 - 99 mg/dL 82  BUN 6 - 20 mg/dL 11  Creatinine 9.55 - 8.99 mg/dL 8.75 (H)  Calcium  8.9 - 10.3 mg/dL 9.2  Anion gap 5 - 15  9  GFR, Estimated >60 mL/min 52 (L)  (H): Data is abnormally high (L): Data is abnormally low   Thyroid  ultrasound 08/27/2021  Right thyroid  lobe is moderately heterogeneous with at least 2 small nodules, largest measuring 0.5 cm. These nodules do not meet criteria for biopsy or dedicated follow-up.   No definite thyroid  tissue in the expected region of the left thyroid  lobe. 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   IMPRESSION: 1. 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 2. Thyroid  tissue is heterogeneous with small nodules. These nodules do not meet criteria for biopsy or dedicated follow-up.     ASSESSMENT/PLAN/RECOMMENDATIONS:   Amiodarone -induced thyroid  disease:   -The patient was initially diagnosed with hypothyroidism and was on LT-for replacement for decades prior to developing hyperthyroidism in 08/2021 -The patient was on amiodarone  from 07/2018 until 09/2021, restarted again in 08/2022 -LT-4 replacement was discontinued and she has been on methimazole  since 08/26/2021 as well as prednisone  -Prednisone  was discontinued by March 2024 -Methimazole  was  discontinued 01/28/2023 - Her TSH continues to be elevated but < 10 uIU/mL , patient has no symptoms of hypothyroidism.  After weighing the risk and the benefit of starting levothyroxine , we have opted to hold off at this time and recheck labs in 3 months   Follow-up in 6 months   Signed electronically by: Stefano Redgie Butts, MD  [facility]  [facility] [address] [place] [place], [place] [address] Phone: [phone] FAX: [phone]   CC: Gretel App, NP [address] [place] [address] Phone: [phone] Fax: [phone]   Return to Endocrinology clinic as below: Future Appointments  Date Time Provider Department Center  04/11/2024  9:40 AM [place] Adelita PENNER, MD [facility] None  05/04/2024  9:00 AM Rolan Ezra RAMAN, MD [facility] None  05/05/2024  7:05 AM [facility] HVT DEVICE REMOTES [facility] H&V  06/07/2024  9:40 AM Gretel App, NP [facility] [address]  08/02/2024 10:30 AM [facility] None  08/02/2024 11:00 AM Micheline Setter D, NP [facility] None  08/04/2024  7:05 AM [facility] HVT DEVICE REMOTES [facility] H&V  10/24/2024 10:50 AM [facility] ANNUAL WELLNESS VISIT [facility] [address]  11/03/2024  7:05 AM [facility] HVT DEVICE REMOTES [facility] H&V  02/02/2025  7:05 AM [facility] HVT DEVICE REMOTES [facility] H&V

## 2024-04-07 ENCOUNTER — Ambulatory Visit (HOSPITAL_COMMUNITY): Payer: Self-pay | Admitting: Pharmacist

## 2024-04-07 LAB — T4

## 2024-04-07 LAB — POCT INR: INR: 2.2 (ref 2.0–3.0)

## 2024-04-09 MED FILL — Quinidine Gluconate Tab ER 324 MG: ORAL | 19 days supply | Qty: 38 | Fill #3 | Status: AC

## 2024-04-10 ENCOUNTER — Other Ambulatory Visit: Payer: Self-pay

## 2024-04-11 ENCOUNTER — Other Ambulatory Visit: Payer: Self-pay

## 2024-04-11 ENCOUNTER — Ambulatory Visit (INDEPENDENT_AMBULATORY_CARE_PROVIDER_SITE_OTHER): Admitting: Family Medicine

## 2024-04-11 ENCOUNTER — Encounter (INDEPENDENT_AMBULATORY_CARE_PROVIDER_SITE_OTHER): Payer: Self-pay | Admitting: Family Medicine

## 2024-04-11 VITALS — BP 99/68 | HR 72 | Temp 98.0°F | Ht 63.0 in | Wt 250.0 lb

## 2024-04-11 DIAGNOSIS — R7989 Other specified abnormal findings of blood chemistry: Secondary | ICD-10-CM

## 2024-04-11 DIAGNOSIS — Z6841 Body Mass Index (BMI) 40.0 and over, adult: Secondary | ICD-10-CM | POA: Diagnosis not present

## 2024-04-11 DIAGNOSIS — Z95811 Presence of heart assist device: Secondary | ICD-10-CM

## 2024-04-11 NOTE — Progress Notes (Signed)
 SUBJECTIVE:  Chief Complaint: Obesity  Interim History: Since last appointment she feels like she has been doing well.  She is eating more and is getting in at least 1100 calories daily.  She is occasionally getting in closer to 1200 calories.  Protein wise she is getting in her goal amount.  She is normally exceeding her protein goal.  No plans for Thanksgiving or Halloween.  She is not feeling tempted by the Halloween candy in the house.  No challenges she forsees in staying consistent.  [name] is here to discuss her progress with her obesity treatment plan. She is on the keeping a food journal and adhering to recommended goals of 1100-1200 calories and 85 grams of protein and states she is following her eating plan approximately 90 % of the time. She states she is exercising 60 minutes 3 times per week.  She is walking at the park.   OBJECTIVE: Visit Diagnoses: Problem List Items Addressed This Visit       Other   LVAD (left ventricular assist device) present (HCC) - Primary   Morbid obesity with BMI of 45.0-49.9, adult (HCC)   Other Visit Diagnoses       Abnormal TSH         BMI 40.0-44.9, adult (HCC)           Vitals Temp: 98 F (36.7 C) BP: 99/68 Pulse Rate: 72 SpO2: 100 %   Anthropometric Measurements Height: 5' 3 (1.6 m) Weight: 250 lb (113.4 kg) BMI (Calculated): 44.3 Weight at Last Visit: 254 lb Weight Lost Since Last Visit: 4 Weight Gained Since Last Visit: 0 Starting Weight: 243 lb Total Weight Loss (lbs): 3 lb (1.361 kg)   Body Composition  Body Fat %: 48.5 % Fat Mass (lbs): 121.6 lbs Muscle Mass (lbs): 122.4 lbs Total Body Water  (lbs): 85.8 lbs Visceral Fat Rating : 16   Other Clinical Data Today's Visit #: 22 Starting Date: 04/09/22 Comments: 1100-1200/85     ASSESSMENT AND PLAN: Assessment & Plan LVAD (left ventricular assist device) present [facility]) Managed by cardiology.  Blood pressure lower end of normal with her numerous pharmaco  agents.  Will follow-up on blood pressure and management after patient sees cardiology.  Denies dizziness or lightheadedness.  Will continue current treatment at this time. Abnormal TSH Saw endocrinology and was told they will defer medication due to concern that she will become hyperthyroid with amiodarone  usage. Morbid obesity with BMI of 45.0-49.9, adult (HCC)  BMI 40.0-44.9, adult (HCC)    Diet: [name] is currently in the action stage of change. As such, her goal is to continue with weight loss efforts and has agreed to keeping a food journal and adhering to recommended goals of 1100-1200 calories and 85 or more grams of protein.   Exercise:  All adults should avoid inactivity. Some activity is better than none, and adults who participate in any amount of physical activity, gain some health benefits.  Behavior Modification:  We discussed the following Behavioral Modification Strategies today: increasing lean protein intake, decreasing simple carbohydrates, increasing vegetables, meal planning and cooking strategies, planning for success, and keep a strict food journal.   Return in about 5 weeks (around 05/16/2024).   She was informed of the importance of frequent follow up visits to maximize her success with intensive lifestyle modifications for her multiple health conditions.  Attestation Statements:   Reviewed by clinician on day of visit: allergies, medications, problem list, medical history, surgical history, family history, social history, and  previous encounter notes.     Adelita Cho, MD

## 2024-04-12 ENCOUNTER — Other Ambulatory Visit: Payer: Self-pay

## 2024-04-12 MED FILL — Quinidine Gluconate Tab ER 324 MG: ORAL | 11 days supply | Qty: 22 | Fill #3 | Status: AC

## 2024-04-14 ENCOUNTER — Ambulatory Visit (HOSPITAL_COMMUNITY): Payer: Self-pay | Admitting: Pharmacist

## 2024-04-14 DIAGNOSIS — Z7901 Long term (current) use of anticoagulants: Secondary | ICD-10-CM | POA: Diagnosis not present

## 2024-04-14 LAB — POCT INR: INR: 2.2 (ref 2.0–3.0)

## 2024-04-17 NOTE — Assessment & Plan Note (Signed)
 Managed by cardiology.  Blood pressure lower end of normal with her numerous pharmaco agents.  Will follow-up on blood pressure and management after patient sees cardiology.  Denies dizziness or lightheadedness.  Will continue current treatment at this time.

## 2024-04-21 ENCOUNTER — Ambulatory Visit (HOSPITAL_COMMUNITY): Payer: Self-pay | Admitting: Pharmacist

## 2024-04-21 LAB — POCT INR: INR: 3.6 — AB (ref 2.0–3.0)

## 2024-04-28 ENCOUNTER — Other Ambulatory Visit: Payer: Self-pay

## 2024-04-28 ENCOUNTER — Ambulatory Visit (HOSPITAL_COMMUNITY): Payer: Self-pay | Admitting: Pharmacist

## 2024-04-28 LAB — POCT INR
INR: 2.2 (ref 2.0–3.0)
INR: 2.2 (ref 2.0–3.0)

## 2024-04-28 MED ORDER — WARFARIN SODIUM 2 MG PO TABS
ORAL_TABLET | ORAL | 3 refills | Status: DC
  Filled 2024-04-28: qty 120, 76d supply, fill #0

## 2024-05-02 ENCOUNTER — Other Ambulatory Visit (HOSPITAL_COMMUNITY): Payer: Self-pay | Admitting: *Deleted

## 2024-05-02 DIAGNOSIS — Z7901 Long term (current) use of anticoagulants: Secondary | ICD-10-CM

## 2024-05-02 DIAGNOSIS — Z95811 Presence of heart assist device: Secondary | ICD-10-CM

## 2024-05-02 DIAGNOSIS — I5022 Chronic systolic (congestive) heart failure: Secondary | ICD-10-CM

## 2024-05-04 ENCOUNTER — Ambulatory Visit (HOSPITAL_COMMUNITY)
Admission: RE | Admit: 2024-05-04 | Discharge: 2024-05-04 | Disposition: A | Discharge status: Home / Self Care | Source: Ambulatory Visit | Attending: Cardiology | Admitting: Cardiology

## 2024-05-04 ENCOUNTER — Ambulatory Visit (HOSPITAL_COMMUNITY): Payer: Self-pay | Admitting: Cardiology

## 2024-05-04 ENCOUNTER — Ambulatory Visit (HOSPITAL_COMMUNITY): Payer: Self-pay | Admitting: Pharmacist

## 2024-05-04 DIAGNOSIS — Z8349 Family history of other endocrine, nutritional and metabolic diseases: Secondary | ICD-10-CM | POA: Diagnosis not present

## 2024-05-04 DIAGNOSIS — Z95810 Presence of automatic (implantable) cardiac defibrillator: Secondary | ICD-10-CM | POA: Insufficient documentation

## 2024-05-04 DIAGNOSIS — Z79890 Hormone replacement therapy (postmenopausal): Secondary | ICD-10-CM | POA: Diagnosis not present

## 2024-05-04 DIAGNOSIS — Z8673 Personal history of transient ischemic attack (TIA), and cerebral infarction without residual deficits: Secondary | ICD-10-CM | POA: Insufficient documentation

## 2024-05-04 DIAGNOSIS — Z7982 Long term (current) use of aspirin: Secondary | ICD-10-CM | POA: Insufficient documentation

## 2024-05-04 DIAGNOSIS — I4891 Unspecified atrial fibrillation: Secondary | ICD-10-CM | POA: Insufficient documentation

## 2024-05-04 DIAGNOSIS — Z7901 Long term (current) use of anticoagulants: Secondary | ICD-10-CM | POA: Insufficient documentation

## 2024-05-04 DIAGNOSIS — Z8543 Personal history of malignant neoplasm of ovary: Secondary | ICD-10-CM | POA: Insufficient documentation

## 2024-05-04 DIAGNOSIS — E0590 Thyrotoxicosis, unspecified without thyrotoxic crisis or storm: Secondary | ICD-10-CM | POA: Diagnosis not present

## 2024-05-04 DIAGNOSIS — I130 Hypertensive heart and chronic kidney disease with heart failure and stage 1 through stage 4 chronic kidney disease, or unspecified chronic kidney disease: Secondary | ICD-10-CM | POA: Insufficient documentation

## 2024-05-04 DIAGNOSIS — Z90722 Acquired absence of ovaries, bilateral: Secondary | ICD-10-CM | POA: Diagnosis not present

## 2024-05-04 DIAGNOSIS — I493 Ventricular premature depolarization: Secondary | ICD-10-CM | POA: Diagnosis not present

## 2024-05-04 DIAGNOSIS — I472 Ventricular tachycardia, unspecified: Secondary | ICD-10-CM | POA: Insufficient documentation

## 2024-05-04 DIAGNOSIS — Z8249 Family history of ischemic heart disease and other diseases of the circulatory system: Secondary | ICD-10-CM | POA: Insufficient documentation

## 2024-05-04 DIAGNOSIS — Z8639 Personal history of other endocrine, nutritional and metabolic disease: Secondary | ICD-10-CM | POA: Insufficient documentation

## 2024-05-04 DIAGNOSIS — I428 Other cardiomyopathies: Secondary | ICD-10-CM | POA: Insufficient documentation

## 2024-05-04 DIAGNOSIS — D631 Anemia in chronic kidney disease: Secondary | ICD-10-CM | POA: Insufficient documentation

## 2024-05-04 DIAGNOSIS — N183 Chronic kidney disease, stage 3 unspecified: Secondary | ICD-10-CM | POA: Diagnosis not present

## 2024-05-04 DIAGNOSIS — I5022 Chronic systolic (congestive) heart failure: Secondary | ICD-10-CM | POA: Diagnosis not present

## 2024-05-04 DIAGNOSIS — Z79899 Other long term (current) drug therapy: Secondary | ICD-10-CM | POA: Diagnosis not present

## 2024-05-04 DIAGNOSIS — I340 Nonrheumatic mitral (valve) insufficiency: Secondary | ICD-10-CM | POA: Insufficient documentation

## 2024-05-04 DIAGNOSIS — Z95811 Presence of heart assist device: Secondary | ICD-10-CM

## 2024-05-04 DIAGNOSIS — Z90710 Acquired absence of both cervix and uterus: Secondary | ICD-10-CM | POA: Insufficient documentation

## 2024-05-04 DIAGNOSIS — Z4509 Encounter for adjustment and management of other cardiac device: Secondary | ICD-10-CM | POA: Insufficient documentation

## 2024-05-04 DIAGNOSIS — Z95818 Presence of other cardiac implants and grafts: Secondary | ICD-10-CM | POA: Insufficient documentation

## 2024-05-04 DIAGNOSIS — E039 Hypothyroidism, unspecified: Secondary | ICD-10-CM

## 2024-05-04 LAB — CBC
HCT: 48.2 % — ABNORMAL HIGH (ref 36.0–46.0)
Hemoglobin: 15.3 g/dL — ABNORMAL HIGH (ref 12.0–15.0)
MCH: 28.1 pg (ref 26.0–34.0)
MCHC: 31.7 g/dL (ref 30.0–36.0)
MCV: 88.4 fL (ref 80.0–100.0)
Platelets: 223 K/uL (ref 150–400)
RBC: 5.45 MIL/uL — ABNORMAL HIGH (ref 3.87–5.11)
RDW: 15.1 % (ref 11.5–15.5)
WBC: 8.2 K/uL (ref 4.0–10.5)
nRBC: 0 % (ref 0.0–0.2)

## 2024-05-04 LAB — TSH: TSH: 7.847 u[IU]/mL — ABNORMAL HIGH (ref 0.350–4.500)

## 2024-05-04 LAB — BASIC METABOLIC PANEL WITH GFR
Anion gap: 11 (ref 5–15)
BUN: 15 mg/dL (ref 6–20)
CO2: 24 mmol/L (ref 22–32)
Calcium: 9.1 mg/dL (ref 8.9–10.3)
Chloride: 105 mmol/L (ref 98–111)
Creatinine, Ser: 1.28 mg/dL — ABNORMAL HIGH (ref 0.44–1.00)
GFR, Estimated: 50 mL/min — ABNORMAL LOW (ref >60)
Glucose, Bld: 86 mg/dL (ref 70–99)
Potassium: 4.3 mmol/L (ref 3.5–5.1)
Sodium: 140 mmol/L (ref 135–145)

## 2024-05-04 LAB — PROTIME-INR
INR: 2.6 — ABNORMAL HIGH (ref 0.8–1.2)
Prothrombin Time: 28.8 s — ABNORMAL HIGH (ref 11.4–15.2)

## 2024-05-04 LAB — HEPATIC FUNCTION PANEL
ALT: 45 U/L — ABNORMAL HIGH (ref 0–44)
AST: 31 U/L (ref 15–41)
Albumin: 3.9 g/dL (ref 3.5–5.0)
Alkaline Phosphatase: 56 U/L (ref 38–126)
Bilirubin, Direct: 0.1 mg/dL (ref 0.0–0.2)
Indirect Bilirubin: 0.6 mg/dL (ref 0.3–0.9)
Total Bilirubin: 0.7 mg/dL (ref 0.0–1.2)
Total Protein: 7.3 g/dL (ref 6.5–8.1)

## 2024-05-04 LAB — LACTATE DEHYDROGENASE: LDH: 229 U/L (ref 105–235)

## 2024-05-04 NOTE — Progress Notes (Signed)
 HF Cardiologist: Dr. Rolan Chief Complaint: LVAD followup, CHF    History of Present Illness  [name] is a 53 y.o. 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  She was followed for systolic CHF initially in [place]. Apparently her EF recovered to 35-40%. However, she was admitted to [facility] 09/19/13 for worsening dyspnea and CP. Coronaries were reportedly OK on LHC at that time at [facility]. Echo showed EF of 15-20% with four-chamber enlargement with moderate-severe TR/MR. RHC as below. Rheumatological w/u negative.  CPX in 9/16 showed near-normal functional capacity.  Repeat echo in 6/17 shows that EF remains 20%. Repeat CPX in 1/18 was submaximal but probably only mild circulatory limitation.  Echo 8/18 showed EF 25-30%, severe LV dilation.  CPX in 6/19 showed low normal functional capacity.  Echo in 10/19 showed EF 20-25%, moderate LV dilation.    Zio patch in 2/20 with 11% PVCs and NSVT, amiodarone  started. Zio patch in 3/20 with 13.3% PVCs and NSVT.  She saw Dr. Fernande, he thought that increased PVCs were electromechanical in nature due to worsening heart failure.  Repeat Zio patch on amiodarone  in 1/21 showed 6.2% PVCs.   She was admitted in 4/20 with VT in setting of hypokalemia.    CPX in 9/20 showed mild functional limitation, more due to body habitus than heart failure.  Echo in 4/21 showed EF 20% with severe LV dilation, diffuse hypokinesis, normal RV size and systolic function.   In 8/21, she had a shock for VF in the setting of hypokalemia.   Patient was admitted in 10/21 with CVA involving left MCA territory.  This was thought to be cardioembolic but LV thrombus was not visualized and no atrial fibrillation has been seen.  Echo in 10/21 showed EF 20-25%, severe LV dilation (no LV thrombus seen), mildly decreased RV systolic function, mild-moderate MR. She was taken by IR for  thrombectomy and stent placement, now on ASA 81 and ticagrelor .   She had recurrent VT in 2/22, quinidine was added by EP.  Spironolactone  and Coreg  were decreased due to low BP and lightheadedness.    Echo in 4/22 showed EF 25% with severe LV dilation and global hypokinesis, normal RV, mild-moderate MR.   CPX (8/22) showed peak VO2 14.3, VE/VCO2 slope 42, RER 1.09 => mild to moderate HF limitation, elevated VE/VCO2 slope is likely false from end exercise hyperventilation. Restrictive lung physiology from obesity.   Patient was admitted in 3/23 with recurrent CHF exacerbation with low cardiac output.  She was also noted to be hyperthyroidism but amiodarone  had to be continued due to VT and poorly tolerated atrial fibrillation.  She required Impella 5.5 placement and eventual HM3 LVAD placement on 09/04/21.  Post-op course complicated by atrial fibrillation, malnutrition, and deconditioning.  She went to CIR prior to discharge.   Patient developed heavy vaginal bleeding in 7/23, she was seen in the ER and required 2 units PRBCs.  She was admitted in 8/23 and had hysterectomy + BSO, LVAD speed increased to 5900 rpm on ramp echo.  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    The patient was admitted overnight in 12/23 with severe posterior epistaxis requiring nasal pacing.   In 2/24, she had VF with shock.  Amiodarone  was restarted.   Echo in 7/24 showed  EF < 20, mild RV dysfunction with normal size,  interventricular septum shifted mildly to right.   At a previous visit in 12/2022, her VAD speed was increased to 6000. She took a torsemide  a day before presenting to clinic in 02/2023 and noted an increased frequency of PI events despite drinking water . Her speed was reduced to 5900 rpm. She continued to have suction events and speed was decreased to 5700 rpm in 11/24.   Ramp echo was done in 12/24, septum bowed to right at 5700 rpm, so speed increased to 5800 rpm.   The aortic valve opened each beat at 5800 rpm with moderate RV dysfunction/normal RV size.   Ramp echo was done in 9/25, LV EF < 20% with moderate RV dilation/moderate RV dysfunction.  Speed decreased to 5600 rpm with IV septum appearing midline and aortic valve opening 1/3 beats.  Speed left at 5600 rpm. Flow remained stable around 5 L/min.   She returns for followup of LVAD.  She is feeling good today.  Less PIs, down to 40-50/day.  No decelerations/speed drops. She feels better with lower speed.  No significant exertional dyspnea.  No lightheadedness.  No palpitations.  Her weight is down 5 lbs.  MAP 78.    LVAD interrogation:  LVAD Documentation    05/04/2024  Device Info  LVAD Type: Heartmate III  Date of Implant: 09/04/2023  Therapy Type: Destination Therapy      05/04/2024  Vitals  Heart Rate: 74 BPM  Automatic BP: 101/70  Doppler MAP: 120 mmHg  SpO2: 97 %    Last 3 Weights Weight Weight  04/11/2024 113.399 kg 250 lb  04/01/2024 116.847 kg 257 lb 9.6 oz  03/09/2024 116.756 kg 257 lb 6.4 oz       05/04/2024  LVAD Paramaters  Speed: 5600 RPM  Flow: 5 LPM  PI: 6  Power: 5 Watts  Hematocrit: 20 %  Alarms: none  Events: 50+  Last Speed Change Date: 03/09/2024  Last Ramp Echo Date: 03/09/2024  Last Right Heart Cath Date: 08/28/2021  Bleeding History: Yes  Type of Bleeding Hx: Other  Type of Bleeding Hx: Uterine  Type of Dressing: Weekly  Annual Maintenance Date: 09/04/2023    Labs    Units 05/04/24 0903 04/28/24 0000 04/21/24 0000 03/17/24 0000 03/09/24 0858 02/11/24 0000 02/01/24 1002  INR  2.6* 2.2  2.2 3.6*   < > 2.2*   < > 2.2*  LDH U/L 229  --   --   --  192  --  234*  HGB g/dL 84.6*  --   --   --  84.8*  --  14.8  CREATININE mg/dL 8.71*  --   --   --  8.56*  --  1.39*   < > = values in this interval not displayed.        Labs (4/25): TSH 4.91, mildly elevated Labs (5/25): hgb 13.4, creatinine 1.3 Labs (6/25): K 4, creatinine 1.34 Labs (8/25): hgb  15.1, TSH 8.8, free T3 normal, K 4.4, creatinine 1.39, LDH 234, LFTs normal.  Labs (9/25): K 3.9, creatinine 1.43, hgb 15.1, LDH 192  PMH: 1. OSA: Using CPAP.  2. PVCs:  - Zio patch 2/20: 11% PVCs - Zio patch 3/20: 13.3% PVCs - Zio patch 1/21: 6.2% PVCs 3. VT/VF: 8/21, in setting of hypokalemia.  2/22 VT, quinidine added.  - VT 2/24, restarted amiodarone  4. 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  No LV thrombus visualized and atrial fibrillation has not been visualized.  - IR thrombectomy with stent placement in 10/21.  5. Chronic systolic CHF: Nonischemic cardiomyopathy:   - Coronary angiography 2015 [facility]: Normal coronaries.  - Echo (8/15) with EF 20%, moderate LV dilation, diffuse hypokinesis, normal RV size with mildly decreased systolic function, mild MR.  - Echo (5/16) with EF 20%, spherical LV with diffuse hypokinesis, mild MR.  - Echo (6/17) with EF 20%, diffuse hypokinesis, moderate LV dilation, normal RV, mild to moderate MR.  - RHC (12/17): mean RA 5, PA 56/31, mean PCWP 24, CI 2, PVR 3.7 WU - Echo (8/18) with EF 25-30%, severe LV dilation, severe LAE.  - Echo (2/20) with EF 20-25%, moderate LV dilation, moderate MR.  - Echo (4/21) with EF 20-25%, severe LV dilation, mild-moderate MR, normal RV, PASP 26 mmHg.  - Echo (10/21) with EF 20-25%, severe LV dilation (no LV thrombus seen), mildly decreased RV systolic function, mild-moderate MR. - CPX in 9/20 showed mild functional limitation, more due to body habitus than heart failure.  - CPX in 8/21: Peak VO2 11.6, VE/VCO2 32, RER 1.2.  Mild HF limitation.  - Echo (4/22): EF 25% with severe LV dilation and global hypokinesis, normal RV, mild-moderate MR.  - CPX (8/22): peak VO2 14.3, VE/VCO2 slope 42, RER 1.09 => mild to moderate HF limitation, elevated VE/VCO2 slope is likely false from end exercise hyperventilation. Restrictive lung physiology from obesity.  - HM3 LVAD placement 09/04/21.  - Echo (7/24): EF < 20, mild RV dysfunction  with normal size, interventricular septum shifted mildly to right. - Ramp echo (12/24): Speed increased to 5800 rpm.  Aortic valve opens each beat, moderate RV dysfunction with normal RV size.  - Ramp echo (9/25): LV EF < 20% with moderate RV dilation/moderate RV dysfunction.  Speed decreased to 5600 rpm with IV septum appearing midline and aortic valve opening 1/3 beats.  Speed left at 5600 rpm.  6. PFTs (1/22): Near normal.  7. Fe deficiency anemia 8. 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  10. Left ovarian mass -  hysterectomy + BSO.  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      Current Outpatient Medications  Medication Sig Dispense Refill   ALPRAZolam  (XANAX ) 0.25 MG tablet Take 1 tablet (0.25 mg total) by mouth 2 (two) times daily as needed for anxiety. 20 tablet 2   amiodarone  (PACERONE ) 200 MG tablet Take 1 tablet (200 mg total) by mouth daily. 90 tablet 3   amLODipine  (NORVASC ) 5 MG tablet Take 1 tablet (5 mg total) by mouth daily. 180 tablet 3   ASPIRIN  LOW DOSE 81 MG tablet TAKE ONE TABLET BY MOUTH ONCE A DAY 30 tablet 11   Cholecalciferol (VITAMIN D3) 125 MCG (5000 UT) CAPS Take 5,000 Units by mouth.     dapagliflozin  propanediol (FARXIGA ) 10 MG TABS tablet Take 1 tablet (10 mg total) by mouth daily before breakfast. 90 tablet 3   estradiol  (CLIMARA  - DOSED IN MG/24 HR) 0.0375 mg/24hr patch Place 1 patch (0.0375 mg total) onto the skin every 7 (seven) days. 4 patch 11   potassium chloride  SA (KLOR-CON  M) 20 MEQ tablet Take 1 tablet (20 mEq total) by mouth daily. 270 tablet 3   quiniDINE gluconate  324 MG CR tablet Take 1 tablet (324 mg total) by mouth 2 (two) times daily. 60 tablet 6   rosuvastatin  (CRESTOR ) 40 MG tablet Take 1 tablet (40 mg total) by mouth daily. 90 tablet 3   sildenafil  (REVATIO ) 20 MG tablet Take 1 tablet (  20 mg total) by mouth 3 (three) times daily. 90 tablet 6   spironolactone   (ALDACTONE ) 25 MG tablet Take 1 tablet (25 mg total) by mouth daily. 90 tablet 3   tirzepatide  7.5 MG/0.5ML injection vial Inject 7.5 mg into the skin once a week. 2 mL 2   torsemide  (DEMADEX ) 20 MG tablet Take 1 tablet (20 mg total) by mouth every other day. Take 1 tablet (20 mg) by mouth as needed, or as directed by heart failure clinic. 180 tablet 3   warfarin (COUMADIN ) 2 MG tablet Take 1 tablet (2 mg) every Tue, Thu, Sat and 2 tablets (4 mg) all other days or as directed by LVAD Clinic. 120 tablet 3   No current facility-administered medications for this encounter.    Allergies:   Patient has no known allergies.   Social History:  The patient  reports that she has never smoked. She has never used smokeless tobacco. She reports that she does not drink alcohol and does not use drugs.   Family History:  The patient's family history includes Heart Problems in her mother; Heart disease in her maternal grandmother and mother; High blood pressure in her father; Hypertension in her father and mother; Obesity in her mother; Stroke in her father.   ROS:  Please see the history of present illness.   All other systems are personally reviewed and negative.   Exam:    MAP 78 General: Well appearing this am. NAD.  HEENT: Normal. Neck: Supple, JVP 7-8 cm. Carotids OK.  Cardiac:  Mechanical heart sounds with LVAD hum present.  Lungs:  CTAB, normal effort.  Abdomen:  NT, ND, no HSM. No bruits or masses. +BS  LVAD exit site: Well-healed and incorporated. Dressing dry and intact. No erythema or drainage. Stabilization device present and accurately applied. Driveline dressing changed daily per sterile technique. Extremities:  Warm and dry. No cyanosis, clubbing, rash, or edema.  Neuro:  Alert & oriented x 3. Cranial nerves grossly intact. Moves all 4 extremities w/o difficulty. Affect pleasant    Recent Labs: 02/01/2024: Magnesium  2.0 03/09/2024: TSH 8.318 05/04/2024: ALT 45; BUN 15; Creatinine, Ser  1.28; Hemoglobin 15.3; Platelets 223; Potassium 4.3; Sodium 140  Personally reviewed   Wt Readings from Last 3 Encounters:  04/11/24 113.4 kg (250 lb)  04/01/24 116.8 kg (257 lb 9.6 oz)  03/09/24 116.8 kg (257 lb 6.4 oz)    ASSESSMENT AND PLAN:   1. Chronic systolic HF: Nonischemic cardiomyopathy.  She has a Medtronic ICD. Cause for cardiomyopathy is uncertain, initially identified peri-partum (after son's birth), so cannot rule out peri-partum CMP.  On and off, she has had frequent PVCs.   RHC in 8/15 showed normal filling pressures and preserved cardiac output. Echo in 4/22 with EF 25%, severe LV dilation.  CPX (8/22) with mild-moderate HF limitation.  Echo in 3/23 with EF < 20% with severe dilation, normal RV size with mildly decreased systolic function, moderate-severe functional MR with dilated IVC. 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 Impella 5.5 placed 08/29/21.  Patient had HM3 LVAD placed 09/04/21 and speed adjusted by ramp echo up to 5800 rpm and again up to 5900 rpm. In 7/24, LVAD speed increased to 6000 rpm under echo guidance. Developed PI and suction events, speed decreased to 5700 rpm.  In 12/24, ramp echo done and speed increased to 5800 rpm; the aortic valve  opened each beat and the septum was midline.  Ramp echo was done in 9/25 showing LV EF < 20% with moderate RV dilation/moderate RV dysfunction.  Speed decreased to 5600 rpm with IV septum appearing midline and aortic valve opening 1/3 beats.  NYHA class I symptoms, not volume overloaded on exam. She feels better with decrease in speed.  - She has torsemide  for use prn.  - Continue Farxiga  10 mg daily.  - Continue spironolactone  25 mg daily.  - Stopped Entresto  due to lightheadedness and low flow events.  - Continue amlodipine  5 mg daily for BP control.  - She should continue ASA 81 mg  daily with history of CVA.  - Continue sildenafil  20 mg tid  - Warfarin for VAD, goal INR 2-2.5.   2. Hyperthyroidism: Has history of hypothroidism thought to be associated with amiodarone .  She had been on Levoxyl  at home prior to 3/23 admission, noted to be hyperthyroid in 3/23.  This likely contributes to her CHF. She had recurrent VT and poorly tolerated AF which made it difficult for us  to stop amiodarone . Initially treated w/ IV Solumedrol -> transitioned to prednisone  and methimazole .  Endocrinology following, prednisone  has been titrated off, now titrated off methimazole .  We had to restart her on amiodarone  with VF.  - Continue amiodarone  200 daily, check LFTs and TSH/free T3/free T4 today. She will need regular eye exam.  3. PVCs/VT: EP consulted given complicated situation with hyperthyroidism and VT. She had a VF episode and amiodarone  was restarted.  - Continue quinidine.  - Continue amiodarone , check LFTs and TSH as above.  4. CVA: CVA in 10/21, had thrombectomy with stent placement.  No LV thrombus noted and no atrial fibrillation noted.  - Would continue ASA 81 daily.   5. Anemia: CBC today.  6. CKD stage 3: BMET today.  7. Mitral regurgitation: Moderate-severe functional MR pre-LVAD.  Unlikely to be Mitraclip candidate with severely dilated LV (>7 cm). Mitral regurgitation was mild on 7/24 echo and 12/24 echo.  8. Left ovarian mass: Hysterectomy + BSO.  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   9. Atrial fibrillation: Tolerates poorly. Has been in NSR.   Followup in 2 months.   I spent 42 minutes reviewing records and LVAD interrogation, examining/interviewing patient, and organizing orders.    Ezra Shuck, MD  05/04/2024  [facility] [address] [facility] [place] [address] [phone] (office) [phone] (fax)

## 2024-05-04 NOTE — Progress Notes (Signed)
 Pt presented for 2 month f/u in VAD Clinic today alone. Reports no problems with VAD equipment or concerns with drive line.  Speed decreased to 5600 last visit. Pt states she has not had any issues since. Reports no symptomatic speed drops. Denies lightheadedness, dizziness, falls, shortness of breath, and signs of bleeding. Continues to stay well hydrated and exercise.   Pt continues Zepbound  injections by [facility]. Pt paying out of pocket.   Vital Signs:  Doppler Pressure: 120 Automatic BP: 101/70(78) HR:  74 SR  SPO2:97   Weight:  252.2 lbs w/o eqt Last weight: 257.4 lb w/o eqt  VAD Indication: Destination Therapy d/t BMI   VAD interrogation & Equipment Management: Speed: 5600 Flow: 4.7 Power: 4.6 w    PI: 5.5   Alarms: none Events: 50+  Fixed speed: 5600 Low speed limit: 5300   Primary Controller: Replace back up battery in 27 months. Back up controller: Replace back up battery in 27 months.   Annual Equipment Maintenance on UBC/PM was performed on 08/03/23.    I reviewed the LVAD parameters from today and compared the results to the patient's prior recorded data. LVAD interrogation was NEGATIVE for significant power changes, NEGATIVE for clinical alarms and STABLE for PI events/speed drops. No programming changes were made and pump is functioning within specified parameters. Pt is performing daily controller and system monitor self tests along with completing weekly and monthly maintenance for LVAD equipment.   LVAD equipment check completed and is in good working order. Back-up equipment present. Charged back up battery and performed self-test on equipment.   Exit Site Care: CDI. Drive line anchor secure. Pt denies fever or chills. Provided with 8 weekly kits and tegaderm.  Significant Events on VAD Support:  N/A  Device: Medtronic Therapies: on VF 240 VT 200 VVI: 40 Last check: 02/04/24  BP & Labs:  Doppler 120 - Doppler correlated with  modified systolic  Hgb 15.3 - No S/S of bleeding. Specifically denies vaginal bleeding, melena/BRBPR or nosebleeds.    LDH 229 with established baseline of 250 - 450. Denies tea-colored urine. No power elevations noted on interrogation.   Hepatic function panel added on to today's labs. Thyroid  panel unable to be run d/t insufficent sample. Last thyroid  panel drawn 03/09/24. Pt has follow up with Endocrinology with labs 06/28/24.  Plan:  No medication changes today Coumadin  dosing per [name] Return to clinic in 2 months for full visit with Dr.McLean  [name] RN, BSN VAD Coordinator 24/7 Pager [phone]

## 2024-05-04 NOTE — Patient Instructions (Signed)
No medication changes  Return in 2 months

## 2024-05-05 ENCOUNTER — Ambulatory Visit (INDEPENDENT_AMBULATORY_CARE_PROVIDER_SITE_OTHER): Payer: Self-pay

## 2024-05-05 DIAGNOSIS — I472 Ventricular tachycardia, unspecified: Secondary | ICD-10-CM | POA: Diagnosis not present

## 2024-05-05 LAB — CUP PACEART REMOTE DEVICE CHECK
Battery Remaining Longevity: 14 mo
Battery Voltage: 2.9 V
Brady Statistic RV Percent Paced: 0.01 %
Date Time Interrogation Session: 20251113001702
HighPow Impedance: 79 Ohm
Implantable Lead Connection Status: 753985
Implantable Lead Implant Date: 20150706
Implantable Lead Location: 753860
Implantable Pulse Generator Implant Date: 20150706
Lead Channel Impedance Value: 380 Ohm
Lead Channel Impedance Value: 456 Ohm
Lead Channel Pacing Threshold Amplitude: 1.125 V
Lead Channel Pacing Threshold Pulse Width: 0.4 ms
Lead Channel Sensing Intrinsic Amplitude: 6.125 mV
Lead Channel Sensing Intrinsic Amplitude: 6.125 mV
Lead Channel Setting Pacing Amplitude: 2.25 V
Lead Channel Setting Pacing Pulse Width: 0.4 ms
Lead Channel Setting Sensing Sensitivity: 0.3 mV

## 2024-05-06 ENCOUNTER — Ambulatory Visit: Payer: Self-pay | Admitting: Cardiology

## 2024-05-09 ENCOUNTER — Other Ambulatory Visit: Payer: Self-pay

## 2024-05-09 MED FILL — Rosuvastatin Calcium Tab 40 MG: ORAL | 90 days supply | Qty: 90 | Fill #1 | Status: AC

## 2024-05-09 MED FILL — Quinidine Gluconate Tab ER 324 MG: ORAL | 30 days supply | Qty: 60 | Fill #4 | Status: AC

## 2024-05-09 MED FILL — Dapagliflozin Propanediol Tab 10 MG (Base Equivalent): ORAL | 90 days supply | Qty: 90 | Fill #1 | Status: AC

## 2024-05-09 MED FILL — Spironolactone Tab 25 MG: ORAL | 90 days supply | Qty: 90 | Fill #2 | Status: AC

## 2024-05-10 ENCOUNTER — Ambulatory Visit (INDEPENDENT_AMBULATORY_CARE_PROVIDER_SITE_OTHER): Payer: Self-pay | Admitting: Family Medicine

## 2024-05-10 ENCOUNTER — Encounter (INDEPENDENT_AMBULATORY_CARE_PROVIDER_SITE_OTHER): Payer: Self-pay | Admitting: Family Medicine

## 2024-05-10 VITALS — HR 70 | Temp 97.6°F | Ht 63.0 in | Wt 245.0 lb

## 2024-05-10 DIAGNOSIS — N1831 Chronic kidney disease, stage 3a: Secondary | ICD-10-CM

## 2024-05-10 DIAGNOSIS — Z6841 Body Mass Index (BMI) 40.0 and over, adult: Secondary | ICD-10-CM

## 2024-05-10 DIAGNOSIS — R7989 Other specified abnormal findings of blood chemistry: Secondary | ICD-10-CM | POA: Diagnosis not present

## 2024-05-10 MED ORDER — TIRZEPATIDE-WEIGHT MANAGEMENT 7.5 MG/0.5ML ~~LOC~~ SOLN: 7.5000 mg | SUBCUTANEOUS | 0 refills | Status: DC

## 2024-05-10 NOTE — Progress Notes (Signed)
 Remote ICD Transmission

## 2024-05-10 NOTE — Progress Notes (Signed)
 SUBJECTIVE:  Chief Complaint: Obesity  Interim History: Patient has been meal prepping over the last few weeks and has not been eating much aside from chicken, fish, vegetables, occasionally sweet potatoes once a week.  She has been able to get her calories in daily- getting in at least 1100 calories and occasionally closer to 1200.  Total protein intake has been averaging around 70 grams of protein. Plan for Thanksgiving is doing a bit of cooking.  She isn't sure what she will be doing for December yet.   [name] is here to discuss her progress with her obesity treatment plan. She is on the keeping a food journal and adhering to recommended goals of 1100-1200 calories and 85 grams of protein and states she is following her eating plan approximately 100 % of the time. She states she is walking 30 minutes 7 times per week.   OBJECTIVE: Visit Diagnoses: Problem List Items Addressed This Visit       Other   Morbid obesity with BMI of 45.0-49.9, adult (HCC)   Relevant Medications   tirzepatide  7.5 MG/0.5ML injection vial    Vitals Temp: 97.6 F (36.4 C) Pulse Rate: 70 SpO2: 100 %   Anthropometric Measurements Height: 5' 3 (1.6 m) Weight: 245 lb (111.1 kg) BMI (Calculated): 43.41 Weight at Last Visit: 250 lb Weight Lost Since Last Visit: 5 Weight Gained Since Last Visit: 0 Starting Weight: 243 lb Total Weight Loss (lbs): 0 lb (0 kg)   Body Composition  Body Fat %: 47.3 % Fat Mass (lbs): 116 lbs Muscle Mass (lbs): 122.6 lbs Total Body Water  (lbs): 83.2 lbs Visceral Fat Rating : 15   Other Clinical Data Today's Visit #: 23 Starting Date: 04/09/22 Comments: 1100-1200/85     ASSESSMENT AND PLAN: Assessment & Plan Abnormal TSH Reviewed labs from last week. TSH slightly lower than before but still outside of goal range.  Patient has upcoming appointment with endocrinology for further management.  Will follow along with treatment plan per endo. Stage 3a chronic kidney  disease (HCC) Reviewed labs done on 05/04/24.  Creatinine improved from previous value.  No medication changes recently.  Continue current heart failure meds per cardiology.  Will need repeat labs in 3-4 months. Morbid obesity with BMI of 45.0-49.9, adult (HCC)  BMI 40.0-44.9, adult (HCC)    Diet: [name] is currently in the action stage of change. As such, her goal is to continue with weight loss efforts and has agreed to keeping a food journal and adhering to recommended goals of 1100-1200 calories and 80 or more grams of protein daily.   Exercise:  For substantial health benefits, adults should do at least 150 minutes (2 hours and 30 minutes) a week of moderate-intensity, or 75 minutes (1 hour and 15 minutes) a week of vigorous-intensity aerobic physical activity, or an equivalent combination of moderate- and vigorous-intensity aerobic activity. Aerobic activity should be performed in episodes of at least 10 minutes, and preferably, it should be spread throughout the week.  Behavior Modification:  We discussed the following Behavioral Modification Strategies today: increasing lean protein intake, decreasing simple carbohydrates, meal planning and cooking strategies, holiday eating strategies, planning for success, and keep a strict food journal. We discussed various medication options to help [name] with her weight loss efforts and we both agreed to continue tirzepatide  at current dose.  Return in about 7 weeks (around 06/28/2024).   She was informed of the importance of frequent follow up visits to maximize her success with intensive  lifestyle modifications for her multiple health conditions.  Attestation Statements:   Reviewed by clinician on day of visit: allergies, medications, problem list, medical history, surgical history, family history, social history, and previous encounter notes.     Miranda Cho, MD

## 2024-05-10 NOTE — Assessment & Plan Note (Signed)
 Reviewed labs done on 05/04/24.  Creatinine improved from previous value.  No medication changes recently.  Continue current heart failure meds per cardiology.  Will need repeat labs in 3-4 months.

## 2024-05-11 ENCOUNTER — Encounter (INDEPENDENT_AMBULATORY_CARE_PROVIDER_SITE_OTHER): Payer: Self-pay | Admitting: Family Medicine

## 2024-05-12 ENCOUNTER — Ambulatory Visit (HOSPITAL_COMMUNITY): Payer: Self-pay | Admitting: Pharmacist

## 2024-05-12 LAB — POCT INR: INR: 2.7 (ref 2.0–3.0)

## 2024-05-12 NOTE — Telephone Encounter (Signed)
 [employer] received your recent request for coverage of Zepbound  7.5MG /0.5ML [place] SOAJ. Your request was denied based on the terms of your prescription benefit plan. This is the initial adverse coverage determination for this request. The reason for the denial was: *Drug Not Covered/Plan Exclusion - Your request for coverage was denied because your prescription benefit plan does not cover the requested medication.

## 2024-05-18 ENCOUNTER — Ambulatory Visit (HOSPITAL_COMMUNITY): Payer: Self-pay | Admitting: Pharmacist

## 2024-05-18 DIAGNOSIS — Z7901 Long term (current) use of anticoagulants: Secondary | ICD-10-CM | POA: Diagnosis not present

## 2024-05-18 LAB — POCT INR: INR: 2.1 (ref 2.0–3.0)

## 2024-05-26 ENCOUNTER — Ambulatory Visit (HOSPITAL_COMMUNITY): Payer: Self-pay

## 2024-05-26 LAB — POCT INR: INR: 2 (ref 2.0–3.0)

## 2024-05-26 MED ORDER — WARFARIN SODIUM 2 MG PO TABS: ORAL_TABLET | ORAL | Status: AC

## 2024-05-26 NOTE — Progress Notes (Signed)
 LVAD INR

## 2024-06-02 ENCOUNTER — Ambulatory Visit (HOSPITAL_COMMUNITY): Payer: Self-pay | Admitting: Pharmacist

## 2024-06-02 LAB — POCT INR: INR: 2.4 (ref 2.0–3.0)

## 2024-06-03 ENCOUNTER — Encounter: Payer: Self-pay | Admitting: Pharmacist

## 2024-06-03 NOTE — Progress Notes (Signed)
 Pharmacy Quality Measure Review  This patient is appearing on a report for being at risk of failing the adherence measure for cholesterol (statin) and diabetes medications this calendar year.   Medication: rosuvastatin  40mg , Farxiga  10 mg Last fill date: 01/08/24 for 90 day supply  Insurance report was not up to date. No action needed at this time.  Both medications have been refilled as of 05/11/24 x90 ds.

## 2024-06-07 ENCOUNTER — Encounter: Payer: Self-pay | Admitting: Nurse Practitioner

## 2024-06-07 ENCOUNTER — Ambulatory Visit: Admitting: Nurse Practitioner

## 2024-06-07 ENCOUNTER — Telehealth: Payer: Self-pay

## 2024-06-07 ENCOUNTER — Other Ambulatory Visit (HOSPITAL_COMMUNITY): Payer: Self-pay

## 2024-06-07 ENCOUNTER — Other Ambulatory Visit: Payer: Self-pay

## 2024-06-07 VITALS — HR 51 | Temp 97.5°F | Ht 63.0 in | Wt 242.8 lb

## 2024-06-07 DIAGNOSIS — I428 Other cardiomyopathies: Secondary | ICD-10-CM

## 2024-06-07 DIAGNOSIS — Z95811 Presence of heart assist device: Secondary | ICD-10-CM

## 2024-06-07 DIAGNOSIS — E785 Hyperlipidemia, unspecified: Secondary | ICD-10-CM

## 2024-06-07 DIAGNOSIS — I5022 Chronic systolic (congestive) heart failure: Secondary | ICD-10-CM

## 2024-06-07 MED ORDER — WEGOVY 0.5 MG/0.5ML ~~LOC~~ SOAJ
0.5000 mg | SUBCUTANEOUS | 1 refills | Status: DC
  Filled 2024-06-07 – 2024-06-08 (×2): qty 2, 28d supply, fill #0

## 2024-06-07 NOTE — Telephone Encounter (Signed)
 Pharmacy Patient Advocate Encounter   Received notification from Physician's Office that prior authorization for Wegovy  0.5MG /0.5ML auto-injectors is required/requested.   Insurance verification completed.   The patient is insured through [facility] ADVANTAGE/RX ADVANCE.   Per test claim: PA required; PA submitted to above mentioned insurance via Latent Key/confirmation #/EOC [facility] Status is pending

## 2024-06-07 NOTE — Progress Notes (Signed)
 Leron Glance, NP-C Phone: [phone]  [name] is a 53 y.o. female who presents today for follow up.   Discussed the use of AI scribe software for clinical note transcription with the patient, who gave verbal consent to proceed.  History of Present Illness   [name] is a 53 year old female who presents for follow-up regarding weight management and medication coverage for Wegovy .  She is seeking assistance with insurance coverage for Wegovy , a medication she has used previously without issues. Her husband recently changed jobs, and the new insurance plan requires preauthorization for Wegovy . Medicare will also cover Wegovy  with preauthorization. Currently, she is self-paying for Zepbound  through [employer] at a dose of 7.5 mg and is concerned about the cost, which is $500 per month. She is trying to avoid this expense by obtaining insurance coverage for Wegovy .  She reports a weight loss of nearly 20 pounds since starting Zepbound , with her weight decreasing from 261 pounds to approximately 242 pounds after accounting for the weight of her LVAD device. Her goal is to reduce her weight to under 200 pounds. She sometimes struggles with eating enough, particularly protein, due to decreased appetite. She exercises at least three times a week and plans to start strength training to build muscle.  She is not on fluid restriction and ensures she stays hydrated by always carrying a bottle of water , which is necessary for her pump. She is also managing her cholesterol with daily medication without any issues.  She has an upcoming lab appointment with her endocrinologist in January for thyroid  management.      Tobacco Use History[1]  Medications Ordered Prior to Encounter[2]   ROS see history of present illness  Objective  Physical Exam Vitals:   06/07/24 0950  Pulse: (!) 51  Temp: (!) 97.5 F (36.4 C)  SpO2: 99%    BP Readings from Last 3 Encounters:  04/11/24 99/68   03/09/24 95/71  02/01/24 104/77   Wt Readings from Last 3 Encounters:  06/07/24 242 lb 12.8 oz (110.1 kg)  05/10/24 245 lb (111.1 kg)  04/11/24 250 lb (113.4 kg)    Physical Exam Constitutional:      General: She is not in acute distress.    Appearance: Normal appearance.  HENT:     Head: Normocephalic.  Cardiovascular:     Comments: Mechanical heart sounds with LVAD present.  Pulmonary:     Effort: Pulmonary effort is normal. No respiratory distress.     Breath sounds: Normal breath sounds.  Skin:    General: Skin is warm and dry.  Neurological:     Mental Status: She is alert.  Psychiatric:        Mood and Affect: Mood normal.        Behavior: Behavior normal.      Assessment/Plan: Please see individual problem list.  Morbid obesity with BMI of 45.0-49.9, adult (HCC) Assessment & Plan: She is currently on Zepbound  7.5 mg weekly and has achieved a weight loss of approximately 20 pounds. She has been paying for this medication through [employer]. Recently obtained new insurance and would like to see if Wegovy  is covered. Insurance preauthorization for Wegovy  is underway. Wegovy  side effects and dosing differences were discussed. She was advised on hydration and protein intake. Initiated Wegovy  at 0.5 mg for 4 weeks, pending insurance approval. Continue Zepbound  until Wegovy  is approved, extending the dosing interval to 10-14 days if necessary. Monitor for Wegovy  side effects and report any  adverse effects. Strength training and adequate protein intake are encouraged. Ensure adequate hydration.   Orders: -     Wegovy ; Inject 0.5 mg into the skin once a week.  Dispense: 2 mL; Refill: 1  SYSTOLIC HEART FAILURE, CHRONIC Assessment & Plan: Closely followed by Cardiology. Has LVAD. Needs cardiac transplant. Working on BMI reduction in order to have transplant. Encouraged to continue working on healthy diet and exercise. She will continue her current medication regimen. Follow  up with Cardiology as scheduled.   Orders: -     Wegovy ; Inject 0.5 mg into the skin once a week.  Dispense: 2 mL; Refill: 1  NICM (nonischemic cardiomyopathy) (HCC) Assessment & Plan: Closely followed by Cardiology. Has an LVAD. Needs cardiac transplant. Working on BMI reduction in order to have transplant. Encouraged to continue working on healthy diet and exercise.   Orders: -     Wegovy ; Inject 0.5 mg into the skin once a week.  Dispense: 2 mL; Refill: 1  LVAD (left ventricular assist device) present [facility]) Assessment & Plan: Continue current management. Follow up with Cardiology.    Hyperlipidemia, unspecified hyperlipidemia type Assessment & Plan: Cholesterol is managed with Crestor . Continue.       Return in about 3 months (around 09/05/2024) for Follow up.   Leron Glance, NP-C [facility] [place]    [1]  Social History Tobacco Use  Smoking Status Never  Smokeless Tobacco Never  [2]  Current Outpatient Medications on File Prior to Visit  Medication Sig Dispense Refill   ALPRAZolam  (XANAX ) 0.25 MG tablet Take 1 tablet (0.25 mg total) by mouth 2 (two) times daily as needed for anxiety. 20 tablet 2   amiodarone  (PACERONE ) 200 MG tablet Take 1 tablet (200 mg total) by mouth daily. 90 tablet 3   amLODipine  (NORVASC ) 5 MG tablet Take 1 tablet (5 mg total) by mouth daily. 180 tablet 3   ASPIRIN  LOW DOSE 81 MG tablet TAKE ONE TABLET BY MOUTH ONCE A DAY 30 tablet 11   Cholecalciferol (VITAMIN D3) 125 MCG (5000 UT) CAPS Take 5,000 Units by mouth.     dapagliflozin  propanediol (FARXIGA ) 10 MG TABS tablet Take 1 tablet (10 mg total) by mouth daily before breakfast. 90 tablet 3   estradiol  (CLIMARA  - DOSED IN MG/24 HR) 0.0375 mg/24hr patch Place 1 patch (0.0375 mg total) onto the skin every 7 (seven) days. 4 patch 11   potassium chloride  SA (KLOR-CON  M) 20 MEQ tablet Take 1 tablet (20 mEq total) by mouth daily. 270 tablet 3   quiniDINE gluconate  324 MG CR  tablet Take 1 tablet (324 mg total) by mouth 2 (two) times daily. 60 tablet 6   rosuvastatin  (CRESTOR ) 40 MG tablet Take 1 tablet (40 mg total) by mouth daily. 90 tablet 3   sildenafil  (REVATIO ) 20 MG tablet Take 1 tablet (20 mg total) by mouth 3 (three) times daily. 90 tablet 6   spironolactone  (ALDACTONE ) 25 MG tablet Take 1 tablet (25 mg total) by mouth daily. 90 tablet 3   torsemide  (DEMADEX ) 20 MG tablet Take 1 tablet (20 mg total) by mouth every other day. Take 1 tablet (20 mg) by mouth as needed, or as directed by heart failure clinic. 180 tablet 3   warfarin (COUMADIN ) 2 MG tablet Take 2 tablets (4 mg) every Mon, Wed, Fri and 1 tablet (2 mg) all other days or as directed by LVAD Clinic.     No current facility-administered medications on file prior to  visit.

## 2024-06-07 NOTE — Assessment & Plan Note (Addendum)
 She is currently on Zepbound  7.5 mg weekly and has achieved a weight loss of approximately 20 pounds. She has been paying for this medication through [employer]. Recently obtained new insurance and would like to see if Wegovy  is covered. Insurance preauthorization for Wegovy  is underway. Wegovy  side effects and dosing differences were discussed. She was advised on hydration and protein intake. Initiated Wegovy  at 0.5 mg for 4 weeks, pending insurance approval. Continue Zepbound  until Wegovy  is approved, extending the dosing interval to 10-14 days if necessary. Monitor for Wegovy  side effects and report any adverse effects. Strength training and adequate protein intake are encouraged. Ensure adequate hydration.

## 2024-06-07 NOTE — Assessment & Plan Note (Signed)
 Cholesterol is managed with Crestor . Continue.

## 2024-06-07 NOTE — Assessment & Plan Note (Signed)
 Continue current management. Follow up with Cardiology.

## 2024-06-07 NOTE — Assessment & Plan Note (Signed)
Closely followed by Cardiology. Has an LVAD. Needs cardiac transplant. Working on BMI reduction in order to have transplant. Encouraged to continue working on healthy diet and exercise.

## 2024-06-07 NOTE — Assessment & Plan Note (Signed)
 Closely followed by Cardiology. Has LVAD. Needs cardiac transplant. Working on BMI reduction in order to have transplant. Encouraged to continue working on healthy diet and exercise. She will continue her current medication regimen. Follow up with Cardiology as scheduled.

## 2024-06-07 NOTE — Telephone Encounter (Signed)
 PA needed for [facility].

## 2024-06-08 ENCOUNTER — Other Ambulatory Visit (HOSPITAL_COMMUNITY): Payer: Self-pay

## 2024-06-08 ENCOUNTER — Other Ambulatory Visit: Payer: Self-pay

## 2024-06-08 MED FILL — Quinidine Gluconate Tab ER 324 MG: ORAL | 30 days supply | Qty: 60 | Fill #5 | Status: AC

## 2024-06-08 NOTE — Telephone Encounter (Signed)
 Pharmacy Patient Advocate Encounter  Received notification from [facility] [employer] that Prior Authorization for Wegovy  0.5MG /0.5ML auto-injectors has been APPROVED from 06/07/2024 to 06/07/2025. Ran test claim, Copay is $0. This test claim was processed through [facility]- copay amounts may vary at other pharmacies due to pharmacy/plan contracts, or as the patient moves through the different stages of their insurance plan.   PA #/Case ID/Reference #: [id number]

## 2024-06-09 ENCOUNTER — Ambulatory Visit (HOSPITAL_COMMUNITY): Payer: Self-pay | Admitting: Pharmacist

## 2024-06-09 ENCOUNTER — Other Ambulatory Visit: Payer: Self-pay

## 2024-06-09 LAB — POCT INR: INR: 2.5 (ref 2.0–3.0)

## 2024-06-15 ENCOUNTER — Ambulatory Visit (HOSPITAL_COMMUNITY): Payer: Self-pay | Admitting: Pharmacist

## 2024-06-15 LAB — POCT INR: INR: 2.8 (ref 2.0–3.0)

## 2024-06-23 LAB — POCT INR: INR: 1.5 — AB (ref 2.0–3.0)

## 2024-06-24 ENCOUNTER — Ambulatory Visit (HOSPITAL_COMMUNITY): Payer: Self-pay | Admitting: Pharmacist

## 2024-06-28 ENCOUNTER — Other Ambulatory Visit

## 2024-06-28 LAB — T3, FREE: T3, Free: 2.4 pg/mL (ref 2.3–4.2)

## 2024-06-28 LAB — T4, FREE: Free T4: 1.4 ng/dL (ref 0.8–1.8)

## 2024-06-28 LAB — TSH: TSH: 5.44 m[IU]/L — ABNORMAL HIGH

## 2024-06-29 ENCOUNTER — Ambulatory Visit (HOSPITAL_COMMUNITY): Admitting: Cardiology

## 2024-06-29 ENCOUNTER — Ambulatory Visit: Payer: Self-pay | Admitting: Internal Medicine

## 2024-06-30 ENCOUNTER — Ambulatory Visit (HOSPITAL_COMMUNITY): Payer: Self-pay | Admitting: Pharmacist

## 2024-06-30 LAB — POCT INR: INR: 2.1 (ref 2.0–3.0)

## 2024-07-04 ENCOUNTER — Ambulatory Visit (INDEPENDENT_AMBULATORY_CARE_PROVIDER_SITE_OTHER): Admitting: Family Medicine

## 2024-07-04 ENCOUNTER — Encounter (INDEPENDENT_AMBULATORY_CARE_PROVIDER_SITE_OTHER): Payer: Self-pay | Admitting: Family Medicine

## 2024-07-04 ENCOUNTER — Other Ambulatory Visit: Payer: Self-pay

## 2024-07-04 VITALS — HR 66 | Ht 63.0 in | Wt 247.0 lb

## 2024-07-04 DIAGNOSIS — I5022 Chronic systolic (congestive) heart failure: Secondary | ICD-10-CM | POA: Diagnosis not present

## 2024-07-04 DIAGNOSIS — Z6841 Body Mass Index (BMI) 40.0 and over, adult: Secondary | ICD-10-CM | POA: Diagnosis not present

## 2024-07-04 DIAGNOSIS — I428 Other cardiomyopathies: Secondary | ICD-10-CM | POA: Diagnosis not present

## 2024-07-04 MED ORDER — WEGOVY 1.7 MG/0.75ML ~~LOC~~ SOAJ
1.7000 mg | SUBCUTANEOUS | 0 refills | Status: AC
  Filled 2024-07-04: qty 3, 28d supply, fill #0

## 2024-07-04 MED ORDER — WEGOVY 1 MG/0.5ML ~~LOC~~ SOAJ
1.0000 mg | SUBCUTANEOUS | 0 refills | Status: AC
  Filled 2024-07-04 – 2024-07-22 (×4): qty 2, 28d supply, fill #0

## 2024-07-04 NOTE — Progress Notes (Signed)
 "  SUBJECTIVE:  Chief Complaint: Obesity  Interim History: Patient last appointment was November 2025.  She voice she did do some indulgent eating over the holidays and stayed local.  She did log consistently over hte holidays but was eating about 1300 calories daily.  She was mostly maintaining but did have an increase last week. She was getting in between 70-80 grams of protein daily.  She may have excess fluid on herself.  [name] is here to discuss her progress with her obesity treatment plan. She is on the keeping a food journal and adhering to recommended goals of 1100-1200 calories and 80 grams of protein and states she is following her eating plan approximately 55 % of the time. She states she is exercising 30 minutes 3 times per week.   OBJECTIVE: Visit Diagnoses: Problem List Items Addressed This Visit       Cardiovascular and Mediastinum   SYSTOLIC HEART FAILURE, CHRONIC - Primary (Chronic)   Relevant Medications   semaglutide -weight management (WEGOVY ) 1 MG/0.5ML SOAJ SQ injection   semaglutide -weight management (WEGOVY ) 1.7 MG/0.75ML SOAJ SQ injection   NICM (nonischemic cardiomyopathy) (HCC)   Relevant Medications   semaglutide -weight management (WEGOVY ) 1 MG/0.5ML SOAJ SQ injection   semaglutide -weight management (WEGOVY ) 1.7 MG/0.75ML SOAJ SQ injection     Other   Morbid obesity with BMI of 45.0-49.9, adult (HCC)   Relevant Medications   semaglutide -weight management (WEGOVY ) 1 MG/0.5ML SOAJ SQ injection   semaglutide -weight management (WEGOVY ) 1.7 MG/0.75ML SOAJ SQ injection   Other Visit Diagnoses       BMI 40.0-44.9, adult (HCC)       Relevant Medications   semaglutide -weight management (WEGOVY ) 1 MG/0.5ML SOAJ SQ injection   semaglutide -weight management (WEGOVY ) 1.7 MG/0.75ML SOAJ SQ injection       Vitals Pulse Rate: 66 SpO2: 99 %   Anthropometric Measurements Height: 5' 3 (1.6 m) Weight: 247 lb (112 kg) BMI (Calculated): 43.76 Weight at Last  Visit: 245 lb Weight Lost Since Last Visit: 0 Weight Gained Since Last Visit: 2 Starting Weight: 243 lb Total Weight Loss (lbs): 0 lb (0 kg)   Body Composition  Body Fat %: 47.4 % Fat Mass (lbs): 117 lbs Muscle Mass (lbs): 123.4 lbs Total Body Water  (lbs): 84.6 lbs Visceral Fat Rating : 16   Other Clinical Data Today's Visit #: 24 Starting Date: 04/09/22 Comments: 1100-1200/80     ASSESSMENT AND PLAN: Assessment & Plan Chronic systolic heart failure (HCC) No symptoms of fluid overload at this current moment.  Patient sees cardiology regularly.  On combination pharmacotherapy for treatment of her heart failure.  No changes in medication at this time. NICM (nonischemic cardiomyopathy) (HCC) Patient is tolerating Wegovy  but is ready for an increase in dosage.  Will increase Wegovy  to 1.7 mg weekly and follow-up at this clinic in 5 weeks.  Keep regularly scheduled cardiology follow-up. BMI 40.0-44.9, adult (HCC)  Morbid obesity with BMI of 45.0-49.9, adult (HCC)    Diet: [name] is currently in the action stage of change. As such, her goal is to continue with weight loss efforts and has agreed to keeping a food journal and adhering to recommended goals of 1100-1200 calories and 85 or more grams of protein.   Exercise:  For substantial health benefits, adults should do at least 150 minutes (2 hours and 30 minutes) a week of moderate-intensity, or 75 minutes (1 hour and 15 minutes) a week of vigorous-intensity aerobic physical activity, or an equivalent combination of moderate- and vigorous-intensity aerobic activity.  Aerobic activity should be performed in episodes of at least 10 minutes, and preferably, it should be spread throughout the week.  Behavior Modification:  We discussed the following Behavioral Modification Strategies today: increasing lean protein intake, decreasing simple carbohydrates, increasing vegetables, meal planning and cooking strategies, keeping healthy  foods in the home, planning for success, and keep a strict food journal. We discussed various medication options to help [name] with her weight loss efforts and we both agreed to increase wegovy  to 1mg  weekly then up to 1.7mg  weekly.  Return in about 6 weeks (around 08/15/2024) for repeat IC.   She was informed of the importance of frequent follow up visits to maximize her success with intensive lifestyle modifications for her multiple health conditions.  Attestation Statements:   Reviewed by clinician on day of visit: allergies, medications, problem list, medical history, surgical history, family history, social history, and previous encounter notes.     Adelita Cho, MD "

## 2024-07-06 ENCOUNTER — Other Ambulatory Visit: Payer: Self-pay

## 2024-07-06 ENCOUNTER — Ambulatory Visit (HOSPITAL_COMMUNITY): Admitting: Cardiology

## 2024-07-06 MED FILL — Spironolactone Tab 25 MG: ORAL | 90 days supply | Qty: 90 | Fill #3 | Status: CN

## 2024-07-06 MED FILL — Quinidine Gluconate Tab ER 324 MG: ORAL | 30 days supply | Qty: 60 | Fill #6 | Status: CN

## 2024-07-06 MED FILL — Rosuvastatin Calcium Tab 40 MG: ORAL | 90 days supply | Qty: 90 | Fill #2 | Status: CN

## 2024-07-07 ENCOUNTER — Ambulatory Visit (HOSPITAL_COMMUNITY): Payer: Self-pay | Admitting: Pharmacist

## 2024-07-07 ENCOUNTER — Other Ambulatory Visit: Payer: Self-pay

## 2024-07-07 ENCOUNTER — Other Ambulatory Visit (HOSPITAL_COMMUNITY): Payer: Self-pay | Admitting: Cardiology

## 2024-07-07 DIAGNOSIS — I480 Paroxysmal atrial fibrillation: Secondary | ICD-10-CM

## 2024-07-07 DIAGNOSIS — Z95811 Presence of heart assist device: Secondary | ICD-10-CM

## 2024-07-07 DIAGNOSIS — I5022 Chronic systolic (congestive) heart failure: Secondary | ICD-10-CM

## 2024-07-07 LAB — POCT INR: INR: 2.1 (ref 2.0–3.0)

## 2024-07-07 MED ORDER — QUINIDINE GLUCONATE ER 324 MG PO TBCR
324.0000 mg | EXTENDED_RELEASE_TABLET | Freq: Two times a day (BID) | ORAL | 6 refills | Status: AC
  Filled 2024-07-07: qty 60, 30d supply, fill #0

## 2024-07-08 ENCOUNTER — Other Ambulatory Visit: Payer: Self-pay

## 2024-07-12 ENCOUNTER — Other Ambulatory Visit (HOSPITAL_COMMUNITY): Payer: Self-pay | Admitting: Unknown Physician Specialty

## 2024-07-12 DIAGNOSIS — Z7901 Long term (current) use of anticoagulants: Secondary | ICD-10-CM

## 2024-07-12 DIAGNOSIS — Z95811 Presence of heart assist device: Secondary | ICD-10-CM

## 2024-07-13 ENCOUNTER — Ambulatory Visit (HOSPITAL_COMMUNITY): Payer: Self-pay | Admitting: Pharmacist

## 2024-07-13 ENCOUNTER — Telehealth (HOSPITAL_COMMUNITY): Payer: Self-pay

## 2024-07-13 ENCOUNTER — Ambulatory Visit (HOSPITAL_COMMUNITY): Payer: Self-pay | Admitting: Cardiology

## 2024-07-13 ENCOUNTER — Ambulatory Visit (HOSPITAL_COMMUNITY)
Admission: RE | Admit: 2024-07-13 | Discharge: 2024-07-13 | Disposition: A | Discharge status: Home / Self Care | Source: Ambulatory Visit | Attending: Cardiology | Admitting: Cardiology

## 2024-07-13 VITALS — BP 110/0 | HR 78 | Ht 63.0 in | Wt 252.4 lb

## 2024-07-13 DIAGNOSIS — Z90710 Acquired absence of both cervix and uterus: Secondary | ICD-10-CM | POA: Diagnosis not present

## 2024-07-13 DIAGNOSIS — N183 Chronic kidney disease, stage 3 unspecified: Secondary | ICD-10-CM | POA: Diagnosis not present

## 2024-07-13 DIAGNOSIS — Z8679 Personal history of other diseases of the circulatory system: Secondary | ICD-10-CM | POA: Insufficient documentation

## 2024-07-13 DIAGNOSIS — Z7984 Long term (current) use of oral hypoglycemic drugs: Secondary | ICD-10-CM | POA: Diagnosis not present

## 2024-07-13 DIAGNOSIS — Z79899 Other long term (current) drug therapy: Secondary | ICD-10-CM | POA: Diagnosis not present

## 2024-07-13 DIAGNOSIS — I493 Ventricular premature depolarization: Secondary | ICD-10-CM | POA: Diagnosis not present

## 2024-07-13 DIAGNOSIS — I4891 Unspecified atrial fibrillation: Secondary | ICD-10-CM | POA: Diagnosis not present

## 2024-07-13 DIAGNOSIS — Z7901 Long term (current) use of anticoagulants: Secondary | ICD-10-CM | POA: Insufficient documentation

## 2024-07-13 DIAGNOSIS — I428 Other cardiomyopathies: Secondary | ICD-10-CM | POA: Insufficient documentation

## 2024-07-13 DIAGNOSIS — I5022 Chronic systolic (congestive) heart failure: Secondary | ICD-10-CM | POA: Insufficient documentation

## 2024-07-13 DIAGNOSIS — Z95810 Presence of automatic (implantable) cardiac defibrillator: Secondary | ICD-10-CM | POA: Diagnosis not present

## 2024-07-13 DIAGNOSIS — D631 Anemia in chronic kidney disease: Secondary | ICD-10-CM | POA: Insufficient documentation

## 2024-07-13 DIAGNOSIS — I130 Hypertensive heart and chronic kidney disease with heart failure and stage 1 through stage 4 chronic kidney disease, or unspecified chronic kidney disease: Secondary | ICD-10-CM | POA: Insufficient documentation

## 2024-07-13 DIAGNOSIS — Z7982 Long term (current) use of aspirin: Secondary | ICD-10-CM | POA: Diagnosis not present

## 2024-07-13 DIAGNOSIS — E0590 Thyrotoxicosis, unspecified without thyrotoxic crisis or storm: Secondary | ICD-10-CM | POA: Diagnosis not present

## 2024-07-13 DIAGNOSIS — Z8674 Personal history of sudden cardiac arrest: Secondary | ICD-10-CM | POA: Insufficient documentation

## 2024-07-13 DIAGNOSIS — Z95811 Presence of heart assist device: Secondary | ICD-10-CM | POA: Insufficient documentation

## 2024-07-13 DIAGNOSIS — I081 Rheumatic disorders of both mitral and tricuspid valves: Secondary | ICD-10-CM | POA: Insufficient documentation

## 2024-07-13 DIAGNOSIS — Z8673 Personal history of transient ischemic attack (TIA), and cerebral infarction without residual deficits: Secondary | ICD-10-CM | POA: Diagnosis not present

## 2024-07-13 DIAGNOSIS — Z90722 Acquired absence of ovaries, bilateral: Secondary | ICD-10-CM | POA: Diagnosis not present

## 2024-07-13 LAB — HEPATIC FUNCTION PANEL
ALT: 37 U/L (ref 0–44)
AST: 31 U/L (ref 15–41)
Albumin: 4.3 g/dL (ref 3.5–5.0)
Alkaline Phosphatase: 74 U/L (ref 38–126)
Bilirubin, Direct: 0.1 mg/dL (ref 0.0–0.2)
Indirect Bilirubin: 0.3 mg/dL (ref 0.3–0.9)
Total Bilirubin: 0.4 mg/dL (ref 0.0–1.2)
Total Protein: 7.4 g/dL (ref 6.5–8.1)

## 2024-07-13 LAB — CBC
HCT: 45.3 % (ref 36.0–46.0)
Hemoglobin: 14.6 g/dL (ref 12.0–15.0)
MCH: 28.3 pg (ref 26.0–34.0)
MCHC: 32.2 g/dL (ref 30.0–36.0)
MCV: 88 fL (ref 80.0–100.0)
Platelets: 210 K/uL (ref 150–400)
RBC: 5.15 MIL/uL — ABNORMAL HIGH (ref 3.87–5.11)
RDW: 14 % (ref 11.5–15.5)
WBC: 7.5 K/uL (ref 4.0–10.5)
nRBC: 0 % (ref 0.0–0.2)

## 2024-07-13 LAB — BASIC METABOLIC PANEL WITH GFR
Anion gap: 10 (ref 5–15)
BUN: 17 mg/dL (ref 6–20)
CO2: 23 mmol/L (ref 22–32)
Calcium: 9.1 mg/dL (ref 8.9–10.3)
Chloride: 105 mmol/L (ref 98–111)
Creatinine, Ser: 1.25 mg/dL — ABNORMAL HIGH (ref 0.44–1.00)
GFR, Estimated: 51 mL/min — ABNORMAL LOW
Glucose, Bld: 72 mg/dL (ref 70–99)
Potassium: 4 mmol/L (ref 3.5–5.1)
Sodium: 139 mmol/L (ref 135–145)

## 2024-07-13 LAB — PROTIME-INR
INR: 2.3 — ABNORMAL HIGH (ref 0.8–1.2)
Prothrombin Time: 26.8 s — ABNORMAL HIGH (ref 11.4–15.2)

## 2024-07-13 LAB — LACTATE DEHYDROGENASE: LDH: 251 U/L — ABNORMAL HIGH (ref 105–235)

## 2024-07-13 NOTE — Assessment & Plan Note (Signed)
 No symptoms of fluid overload at this current moment.  Patient sees cardiology regularly.  On combination pharmacotherapy for treatment of her heart failure.  No changes in medication at this time.

## 2024-07-13 NOTE — Assessment & Plan Note (Signed)
 Patient is tolerating Wegovy  but is ready for an increase in dosage.  Will increase Wegovy  to 1.7 mg weekly and follow-up at this clinic in 5 weeks.  Keep regularly scheduled cardiology follow-up.
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
## 2024-07-13 NOTE — Progress Notes (Signed)
 Pt presented for 2 month f/u in VAD Clinic today alone. Reports no problems with VAD equipment or concerns with drive line.  Pt is doing well. Denies lightheadedness, dizziness, falls, shortness of breath, and signs of bleeding. Continues to stay well hydrated and exercise.   Pt continues Wegovy  injections by [facility]. Weight is the same today as last visit.  Vital Signs:  Doppler Pressure: 110 Automatic BP: 99/81 (89) HR:  78 SR  SPO2: 98   Weight:  252.4 lbs w/o eqt Last weight: 252.2 lb w/o eqt  VAD Indication: Destination Therapy d/t BMI   VAD interrogation & Equipment Management: Speed: 5600 Flow: 5.1 Power: 4.4 w    PI: 3.5   Alarms: none Events: 10-20 daily  Fixed speed: 5600 Low speed limit: 5300   Primary Controller: Replace back up battery in 25 months. Back up controller: Replace back up battery in 25 months.   Annual Equipment Maintenance on UBC/PM was performed on 08/03/23.    I reviewed the LVAD parameters from today and compared the results to the patient's prior recorded data. LVAD interrogation was NEGATIVE for significant power changes, NEGATIVE for clinical alarms and STABLE for PI events/speed drops. No programming changes were made and pump is functioning within specified parameters. Pt is performing daily controller and system monitor self tests along with completing weekly and monthly maintenance for LVAD equipment.   LVAD equipment check completed and is in good working order. Back-up equipment present. Charged back up battery and performed self-test on equipment.   Exit Site Care: CDI. Drive line anchor secure. Pt denies fever or chills. Provided with 8 weekly kits and tegaderm.  Significant Events on VAD Support:  N/A  Device: [employer] Therapies: on VF 240 VT 200 VVI: 40 Last check: 02/04/24  BP & Labs:  Doppler 110 - Doppler correlated with modified systolic  Hgb 14.6 - No S/S of bleeding. Specifically denies vaginal  bleeding, melena/BRBPR or nosebleeds.    LDH 251 with established baseline of 250 - 450. Denies tea-colored urine. No power elevations noted on interrogation.    Plan:  No medication changes today Coumadin  dosing per [name] Return to clinic in 2 months for full visit with Dr.McLean  Lauraine Ip RN, BSN VAD Coordinator 24/7 Pager [phone]

## 2024-07-14 ENCOUNTER — Other Ambulatory Visit: Payer: Self-pay

## 2024-07-14 NOTE — Progress Notes (Signed)
 HF Cardiologist: Dr. Rolan Chief Complaint: LVAD followup, CHF    History of Present Illness  [name] is a 54 y.o. 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  She was followed for systolic CHF initially in [place]. Apparently her EF recovered to 35-40%. However, she was admitted to [facility] 09/19/13 for worsening dyspnea and CP. Coronaries were reportedly OK on LHC at that time at [facility]. Echo showed EF of 15-20% with four-chamber enlargement with moderate-severe TR/MR. RHC as below. Rheumatological w/u negative.  CPX in 9/16 showed near-normal functional capacity.  Repeat echo in 6/17 shows that EF remains 20%. Repeat CPX in 1/18 was submaximal but probably only mild circulatory limitation.  Echo 8/18 showed EF 25-30%, severe LV dilation.  CPX in 6/19 showed low normal functional capacity.  Echo in 10/19 showed EF 20-25%, moderate LV dilation.    Zio patch in 2/20 with 11% PVCs and NSVT, amiodarone  started. Zio patch in 3/20 with 13.3% PVCs and NSVT.  She saw Dr. Fernande, he thought that increased PVCs were electromechanical in nature due to worsening heart failure.  Repeat Zio patch on amiodarone  in 1/21 showed 6.2% PVCs.   She was admitted in 4/20 with VT in setting of hypokalemia.    CPX in 9/20 showed mild functional limitation, more due to body habitus than heart failure.  Echo in 4/21 showed EF 20% with severe LV dilation, diffuse hypokinesis, normal RV size and systolic function.   In 8/21, she had a shock for VF in the setting of hypokalemia.   Patient was admitted in 10/21 with CVA involving left MCA territory.  This was thought to be cardioembolic but LV thrombus was not visualized and no atrial fibrillation has been seen.  Echo in 10/21 showed EF 20-25%, severe LV dilation (no LV thrombus seen), mildly decreased RV systolic function, mild-moderate MR. She was taken by IR for  thrombectomy and stent placement, now on ASA 81 and ticagrelor .   She had recurrent VT in 2/22, quinidine was added by EP.  Spironolactone  and Coreg  were decreased due to low BP and lightheadedness.    Echo in 4/22 showed EF 25% with severe LV dilation and global hypokinesis, normal RV, mild-moderate MR.   CPX (8/22) showed peak VO2 14.3, VE/VCO2 slope 42, RER 1.09 => mild to moderate HF limitation, elevated VE/VCO2 slope is likely false from end exercise hyperventilation. Restrictive lung physiology from obesity.   Patient was admitted in 3/23 with recurrent CHF exacerbation with low cardiac output.  She was also noted to be hyperthyroidism but amiodarone  had to be continued due to VT and poorly tolerated atrial fibrillation.  She required Impella 5.5 placement and eventual HM3 LVAD placement on 09/04/21.  Post-op course complicated by atrial fibrillation, malnutrition, and deconditioning.  She went to CIR prior to discharge.   Patient developed heavy vaginal bleeding in 7/23, she was seen in the ER and required 2 units PRBCs.  She was admitted in 8/23 and had hysterectomy + BSO, LVAD speed increased to 5900 rpm on ramp echo.  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    The patient was admitted overnight in 12/23 with severe posterior epistaxis requiring nasal pacing.   In 2/24, she had VF with shock.  Amiodarone  was restarted.   Echo in 7/24 showed  EF < 20, mild RV dysfunction with normal size,  interventricular septum shifted mildly to right.   At a previous visit in 12/2022, her VAD speed was increased to 6000. She took a torsemide  a day before presenting to clinic in 02/2023 and noted an increased frequency of PI events despite drinking water . Her speed was reduced to 5900 rpm. She continued to have suction events and speed was decreased to 5700 rpm in 11/24.   Ramp echo was done in 12/24, septum bowed to right at 5700 rpm, so speed increased to 5800 rpm.   The aortic valve opened each beat at 5800 rpm with moderate RV dysfunction/normal RV size.   Ramp echo was done in 9/25, LV EF < 20% with moderate RV dilation/moderate RV dysfunction.  Speed decreased to 5600 rpm with IV septum appearing midline and aortic valve opening 1/3 beats.  Speed left at 5600 rpm. Flow remained stable around 5 L/min.   She returns for followup of LVAD.  She is feeling great today.  PIs down considerably to 10-20 and no low flow alarms.  MAP 89 today.  Weight stable.  She exercises regularly and denies exertional dyspnea.  Does not feel like she has significant limitation currently.  No lightheadedness.  No orthopnea/PND.  No BPRBPR/melena. She is not taking torsemide .   LVAD interrogation:  LVAD Documentation    07/13/2024  Device Info  LVAD Type: Heartmate III  Date of Implant: 09/04/2023      07/13/2024  Vitals  Heart Rate: 78 BPM  Automatic BP: 99/81  Doppler MAP: 110 mmHg  SpO2: 98 %    Last 3 Weights Weight Weight  07/13/2024 114.488 kg 252 lb 6.4 oz  07/04/2024 112.038 kg 247 lb  06/07/2024 110.133 kg 242 lb 12.8 oz       07/13/2024  LVAD Paramaters  Speed: 5600 RPM  Flow: 5 LPM  PI: 4  Power: 4 Watts  Hematocrit: 20 %  Alarms: none  Events: 10-20 daily  Last Speed Change Date: 03/09/2024  Last Ramp Echo Date: 03/09/2024  Last Right Heart Cath Date: 08/28/2021  Bleeding History: Yes  Type of Bleeding Hx: Other  Type of Bleeding Hx: Uterine  Type of Dressing: Weekly  Annual Maintenance Date: 09/04/2023    Labs    Units 07/13/24 0907 07/07/24 0000 06/30/24 0000 05/12/24 0000 05/04/24 0903 03/17/24 0000 03/09/24 0858  INR  2.3* 2.1 2.1   < > 2.6*   < > 2.2*  LDH U/L 251*  --   --   --  229  --  192  HGB g/dL 85.3  --   --   --  84.6*  --  15.1*  CREATININE mg/dL 8.74*  --   --   --  8.71*  --  1.43*   < > = values in this interval not displayed.        Labs (4/25): TSH 4.91, mildly elevated Labs (5/25): hgb 13.4, creatinine 1.3 Labs  (6/25): K 4, creatinine 1.34 Labs (8/25): hgb 15.1, TSH 8.8, free T3 normal, K 4.4, creatinine 1.39, LDH 234, LFTs normal.  Labs (9/25): K 3.9, creatinine 1.43, hgb 15.1, LDH 192 Labs (11/25): LFTs normal Labs (1/26): hgb 14.6, TSH mildly elevated but free T3 and T4 normal  PMH: 1. OSA: Using CPAP.  2. PVCs:  - Zio patch 2/20: 11% PVCs - Zio patch 3/20: 13.3% PVCs - Zio patch 1/21: 6.2% PVCs 3. VT/VF: 8/21, in setting of hypokalemia.  2/22 VT, quinidine added.  - VT 2/24, restarted amiodarone  4. CVA: 10/21, left  MCA territory.  No LV thrombus visualized and atrial fibrillation has not been visualized.  - IR thrombectomy with stent placement in 10/21.  5. Chronic systolic CHF: Nonischemic cardiomyopathy:   - Coronary angiography [facility] at [facility]: Normal coronaries.  - Echo (8/15) with EF 20%, moderate LV dilation, diffuse hypokinesis, normal RV size with mildly decreased systolic function, mild MR.  - Echo (5/16) with EF 20%, spherical LV with diffuse hypokinesis, mild MR.  - Echo (6/17) with EF 20%, diffuse hypokinesis, moderate LV dilation, normal RV, mild to moderate MR.  - RHC (12/17): mean RA 5, PA 56/31, mean PCWP 24, CI 2, PVR 3.7 WU - Echo (8/18) with EF 25-30%, severe LV dilation, severe LAE.  - Echo (2/20) with EF 20-25%, moderate LV dilation, moderate MR.  - Echo (4/21) with EF 20-25%, severe LV dilation, mild-moderate MR, normal RV, PASP 26 mmHg.  - Echo (10/21) with EF 20-25%, severe LV dilation (no LV thrombus seen), mildly decreased RV systolic function, mild-moderate MR. - CPX in 9/20 showed mild functional limitation, more due to body habitus than heart failure.  - CPX in 8/21: Peak VO2 11.6, VE/VCO2 32, RER 1.2.  Mild HF limitation.  - Echo (4/22): EF 25% with severe LV dilation and global hypokinesis, normal RV, mild-moderate MR.  - CPX (8/22): peak VO2 14.3, VE/VCO2 slope 42, RER 1.09 => mild to moderate HF limitation, elevated VE/VCO2 slope is likely false from end  exercise hyperventilation. Restrictive lung physiology from obesity.  - HM3 LVAD placement 09/04/21.  - Echo (7/24): EF < 20, mild RV dysfunction with normal size, interventricular septum shifted mildly to right. - Ramp echo (12/24): Speed increased to 5800 rpm.  Aortic valve opens each beat, moderate RV dysfunction with normal RV size.  - Ramp echo (9/25): LV EF < 20% with moderate RV dilation/moderate RV dysfunction.  Speed decreased to 5600 rpm with IV septum appearing midline and aortic valve opening 1/3 beats.  Speed left at 5600 rpm.  6. PFTs (1/22): Near normal.  7. Fe deficiency anemia 8. 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  10. Left ovarian mass -  hysterectomy + BSO.  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      Current Outpatient Medications  Medication Sig Dispense Refill   amiodarone  (PACERONE ) 200 MG tablet Take 1 tablet (200 mg total) by mouth daily. 90 tablet 3   amLODipine  (NORVASC ) 5 MG tablet Take 1 tablet (5 mg total) by mouth daily. 180 tablet 3   ASPIRIN  LOW DOSE 81 MG tablet TAKE ONE TABLET BY MOUTH ONCE A DAY 30 tablet 11   Cholecalciferol (VITAMIN D3) 125 MCG (5000 UT) CAPS Take 5,000 Units by mouth.     dapagliflozin  propanediol (FARXIGA ) 10 MG TABS tablet Take 1 tablet (10 mg total) by mouth daily before breakfast. 90 tablet 3   estradiol  (CLIMARA  - DOSED IN MG/24 HR) 0.0375 mg/24hr patch Place 1 patch (0.0375 mg total) onto the skin every 7 (seven) days. 4 patch 11   quiniDINE gluconate  324 MG CR tablet Take 1 tablet (324 mg total) by mouth 2 (two) times daily. 60 tablet 6   rosuvastatin  (CRESTOR ) 40 MG tablet Take 1 tablet (40 mg total) by mouth daily. 90 tablet 3   semaglutide -weight management (WEGOVY ) 1 MG/0.5ML SOAJ SQ injection Inject 1 mg into the skin once a week. 2 mL 0   sildenafil  (REVATIO ) 20 MG tablet Take 1 tablet (20 mg  total) by mouth 3 (three) times daily. 90  tablet 6   spironolactone  (ALDACTONE ) 25 MG tablet Take 1 tablet (25 mg total) by mouth daily. 90 tablet 3   warfarin (COUMADIN ) 2 MG tablet Take 2 tablets (4 mg) every Mon, Wed, Fri and 1 tablet (2 mg) all other days or as directed by LVAD Clinic.     ALPRAZolam  (XANAX ) 0.25 MG tablet Take 1 tablet (0.25 mg total) by mouth 2 (two) times daily as needed for anxiety. (Patient not taking: Reported on 07/13/2024) 20 tablet 2   potassium chloride  SA (KLOR-CON  M) 20 MEQ tablet Take 1 tablet (20 mEq total) by mouth daily. (Patient not taking: Reported on 07/13/2024) 270 tablet 3   semaglutide -weight management (WEGOVY ) 1.7 MG/0.75ML SOAJ SQ injection Inject 1.7 mg into the skin once a week. (Patient not taking: Reported on 07/13/2024) 3 mL 0   torsemide  (DEMADEX ) 20 MG tablet Take 1 tablet (20 mg total) by mouth every other day. Take 1 tablet (20 mg) by mouth as needed, or as directed by heart failure clinic. (Patient not taking: Reported on 07/13/2024) 180 tablet 3   No current facility-administered medications for this encounter.    Allergies:   Patient has no known allergies.   Social History:  The patient  reports that she has never smoked. She has never used smokeless tobacco. She reports that she does not drink alcohol and does not use drugs.   Family History:  The patient's family history includes Heart Problems in her mother; Heart disease in her maternal grandmother and mother; High blood pressure in her father; Hypertension in her father and mother; Obesity in her mother; Stroke in her father.   ROS:  Please see the history of present illness.   All other systems are personally reviewed and negative.   Exam:    MAP 89 General: Well appearing this am. NAD.  HEENT: Normal. Neck: Supple, JVP 7-8 cm. Carotids OK.  Cardiac:  Mechanical heart sounds with LVAD hum present.  Lungs:  CTAB, normal effort.  Abdomen:  NT, ND, no HSM. No bruits or masses. +BS  LVAD exit site: Well-healed and  incorporated. Dressing dry and intact. No erythema or drainage. Stabilization device present and accurately applied. Driveline dressing changed daily per sterile technique. Extremities:  Warm and dry. No cyanosis, clubbing, rash, or edema.  Neuro:  Alert & oriented x 3. Cranial nerves grossly intact. Moves all 4 extremities w/o difficulty. Affect pleasant  ]  Recent Labs: 02/01/2024: Magnesium  2.0 06/28/2024: TSH 5.44 07/13/2024: ALT 37; BUN 17; Creatinine, Ser 1.25; Hemoglobin 14.6; Platelets 210; Potassium 4.0; Sodium 139  Personally reviewed   Wt Readings from Last 3 Encounters:  07/13/24 114.5 kg (252 lb 6.4 oz)  07/04/24 112 kg (247 lb)  06/07/24 110.1 kg (242 lb 12.8 oz)    ASSESSMENT AND PLAN:   1. Chronic systolic HF: Nonischemic cardiomyopathy.  She has a Medtronic ICD. Cause for cardiomyopathy is uncertain, initially identified peri-partum (after son's birth), so cannot rule out peri-partum CMP.  On and off, she has had frequent PVCs.   RHC in 8/15 showed normal filling pressures and preserved cardiac output. Echo in 4/22 with EF 25%, severe LV dilation.  CPX (8/22) with mild-moderate HF limitation.  Echo in 3/23 with EF < 20% with severe dilation, normal RV size with mildly decreased systolic function, moderate-severe functional MR with dilated IVC. 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 Impella 5.5 placed 08/29/21.  Patient had HM3 LVAD placed 09/04/21 and speed adjusted by ramp echo up to 5800 rpm and again up to 5900 rpm. In 7/24, LVAD speed increased to 6000 rpm under echo guidance. Developed PI and suction events, speed decreased to 5700 rpm.  In 12/24, ramp echo done and speed increased to 5800 rpm; the aortic valve opened each beat and the septum was midline.  Ramp echo was done in 9/25 showing LV EF < 20% with moderate RV dilation/moderate RV  dysfunction.  Speed decreased to 5600 rpm with IV septum appearing midline and aortic valve opening 1/3 beats.  NYHA class I, not volume overloaded on exam.  - She has torsemide  for use prn.  - Continue Farxiga  10 mg daily.  - Continue spironolactone  25 mg daily.  - Stopped Entresto  due to lightheadedness and low flow events.  - Continue amlodipine  5 mg daily for BP control.  - She should continue ASA 81 mg daily with history of CVA.  - Continue sildenafil  20 mg tid  - Warfarin for VAD, goal INR 2-2.5.   2. Hyperthyroidism: Has history of hypothroidism thought to be associated with amiodarone .  She had been on Levoxyl  at home prior to 3/23 admission, noted to be hyperthyroid in 3/23.  This likely contributes to her CHF. She had recurrent VT and poorly tolerated AF which made it difficult for us  to stop amiodarone . Initially treated w/ IV Solumedrol -> transitioned to prednisone  and methimazole .  Endocrinology following, prednisone  has been titrated off, now titrated off methimazole .  We had to restart her on amiodarone  with VF.  - Continue amiodarone  200 daily, check LFTs.  Recent thyroid  labs were ok. She will need regular eye exam.  3. PVCs/VT: EP consulted given complicated situation with hyperthyroidism and VT. She had a VF episode and amiodarone  was restarted.  - Continue quinidine.  - Continue amiodarone , check LFTs and follow thyroid  labs as above.  4. CVA: CVA in 10/21, had thrombectomy with stent placement.  No LV thrombus noted and no atrial fibrillation noted.  - Would continue ASA 81 daily.   5. Anemia: Good hgb today.  6. CKD stage 3: BMET today.  7. Mitral regurgitation: Moderate-severe functional MR pre-LVAD.  Unlikely to be Mitraclip candidate with severely dilated LV (>7 cm). Mitral regurgitation was mild on 7/24 echo and 12/24 echo.  8. Left ovarian mass: Hysterectomy + BSO.  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   9. Atrial fibrillation:  Tolerates poorly. Has been in NSR.   Followup in 2 months.   I spent 41 minutes reviewing records and LVAD interrogation, examining/interviewing patient, and organizing orders.    Ezra Shuck, MD  07/14/2024  [facility] [address] [facility] [place] [address] [phone] (office) [phone] (fax)

## 2024-07-15 ENCOUNTER — Other Ambulatory Visit: Payer: Self-pay

## 2024-07-21 ENCOUNTER — Ambulatory Visit (HOSPITAL_COMMUNITY): Payer: Self-pay | Admitting: Pharmacist

## 2024-07-21 LAB — POCT INR: INR: 1.8 — AB (ref 2.0–3.0)

## 2024-07-22 ENCOUNTER — Other Ambulatory Visit: Payer: Self-pay

## 2024-07-28 ENCOUNTER — Ambulatory Visit (HOSPITAL_COMMUNITY): Payer: Self-pay

## 2024-07-28 LAB — POCT INR: INR: 2.3 (ref 2.0–3.0)

## 2024-08-02 ENCOUNTER — Other Ambulatory Visit

## 2024-08-02 ENCOUNTER — Ambulatory Visit: Admitting: Gynecologic Oncology

## 2024-08-02 DIAGNOSIS — D3910 Neoplasm of uncertain behavior of unspecified ovary: Secondary | ICD-10-CM

## 2024-08-15 ENCOUNTER — Ambulatory Visit (INDEPENDENT_AMBULATORY_CARE_PROVIDER_SITE_OTHER): Admitting: Family Medicine

## 2024-09-08 ENCOUNTER — Ambulatory Visit: Admitting: Nurse Practitioner

## 2024-09-13 ENCOUNTER — Ambulatory Visit (HOSPITAL_COMMUNITY): Admitting: Cardiology

## 2024-10-03 ENCOUNTER — Ambulatory Visit: Admitting: Internal Medicine

## 2024-10-24 ENCOUNTER — Ambulatory Visit
